# Patient Record
Sex: Female | Born: 1950 | Race: Black or African American | Hispanic: No | Smoking: Never smoker
Health system: Southern US, Community
[De-identification: ages and names within clinical notes are randomized; demographics above are authoritative.]

## PROBLEM LIST (undated history)

## (undated) DIAGNOSIS — E039 Hypothyroidism, unspecified: Secondary | ICD-10-CM

## (undated) DIAGNOSIS — M1990 Unspecified osteoarthritis, unspecified site: Secondary | ICD-10-CM

## (undated) DIAGNOSIS — C73 Malignant neoplasm of thyroid gland: Secondary | ICD-10-CM

## (undated) DIAGNOSIS — D649 Anemia, unspecified: Secondary | ICD-10-CM

## (undated) DIAGNOSIS — E785 Hyperlipidemia, unspecified: Secondary | ICD-10-CM

## (undated) DIAGNOSIS — I679 Cerebrovascular disease, unspecified: Secondary | ICD-10-CM

## (undated) DIAGNOSIS — J302 Other seasonal allergic rhinitis: Secondary | ICD-10-CM

## (undated) DIAGNOSIS — I480 Paroxysmal atrial fibrillation: Secondary | ICD-10-CM

## (undated) DIAGNOSIS — F329 Major depressive disorder, single episode, unspecified: Secondary | ICD-10-CM

## (undated) DIAGNOSIS — R931 Abnormal findings on diagnostic imaging of heart and coronary circulation: Secondary | ICD-10-CM

## (undated) DIAGNOSIS — Z950 Presence of cardiac pacemaker: Secondary | ICD-10-CM

## (undated) DIAGNOSIS — I6381 Other cerebral infarction due to occlusion or stenosis of small artery: Secondary | ICD-10-CM

## (undated) DIAGNOSIS — I639 Cerebral infarction, unspecified: Secondary | ICD-10-CM

## (undated) DIAGNOSIS — K219 Gastro-esophageal reflux disease without esophagitis: Secondary | ICD-10-CM

## (undated) DIAGNOSIS — F32A Depression, unspecified: Secondary | ICD-10-CM

## (undated) DIAGNOSIS — E109 Type 1 diabetes mellitus without complications: Secondary | ICD-10-CM

## (undated) DIAGNOSIS — I509 Heart failure, unspecified: Secondary | ICD-10-CM

## (undated) DIAGNOSIS — K649 Unspecified hemorrhoids: Secondary | ICD-10-CM

## (undated) DIAGNOSIS — I10 Essential (primary) hypertension: Secondary | ICD-10-CM

## (undated) HISTORY — DX: Cerebral infarction, unspecified: I63.9

## (undated) HISTORY — DX: Unspecified hemorrhoids: K64.9

## (undated) HISTORY — DX: Other seasonal allergic rhinitis: J30.2

## (undated) HISTORY — DX: Unspecified osteoarthritis, unspecified site: M19.90

## (undated) HISTORY — PX: CARDIAC CATHETERIZATION: SHX172

## (undated) HISTORY — DX: Gastro-esophageal reflux disease without esophagitis: K21.9

## (undated) HISTORY — DX: Hyperlipidemia, unspecified: E78.5

## (undated) HISTORY — DX: Heart failure, unspecified: I50.9

## (undated) HISTORY — DX: Hypothyroidism, unspecified: E03.9

## (undated) HISTORY — PX: ANKLE FRACTURE SURGERY: SHX122

## (undated) HISTORY — DX: Anemia, unspecified: D64.9

## (undated) HISTORY — DX: Essential (primary) hypertension: I10

## (undated) HISTORY — DX: Depression, unspecified: F32.A

## (undated) HISTORY — DX: Abnormal findings on diagnostic imaging of heart and coronary circulation: R93.1

## (undated) HISTORY — DX: Malignant neoplasm of thyroid gland: C73

## (undated) HISTORY — DX: Major depressive disorder, single episode, unspecified: F32.9

## (undated) HISTORY — DX: Type 1 diabetes mellitus without complications: E10.9

---

## 1993-12-25 HISTORY — PX: ABDOMINAL HYSTERECTOMY: SHX81

## 1998-04-23 ENCOUNTER — Inpatient Hospital Stay (HOSPITAL_COMMUNITY): Admission: EM | Admit: 1998-04-23 | Discharge: 1998-04-25 | Payer: Self-pay | Admitting: Emergency Medicine

## 1999-02-23 ENCOUNTER — Encounter: Payer: Self-pay | Admitting: Internal Medicine

## 1999-02-23 ENCOUNTER — Inpatient Hospital Stay (HOSPITAL_COMMUNITY): Admission: EM | Admit: 1999-02-23 | Discharge: 1999-02-25 | Payer: Self-pay | Admitting: Internal Medicine

## 1999-04-13 ENCOUNTER — Ambulatory Visit (HOSPITAL_COMMUNITY): Admission: RE | Admit: 1999-04-13 | Discharge: 1999-04-13 | Payer: Self-pay | Admitting: Internal Medicine

## 1999-04-13 ENCOUNTER — Encounter: Payer: Self-pay | Admitting: Internal Medicine

## 1999-04-14 ENCOUNTER — Ambulatory Visit (HOSPITAL_COMMUNITY): Admission: RE | Admit: 1999-04-14 | Discharge: 1999-04-14 | Payer: Self-pay | Admitting: Internal Medicine

## 1999-04-14 ENCOUNTER — Encounter: Payer: Self-pay | Admitting: Internal Medicine

## 2001-02-15 ENCOUNTER — Emergency Department (HOSPITAL_COMMUNITY): Admission: EM | Admit: 2001-02-15 | Discharge: 2001-02-15 | Payer: Self-pay | Admitting: Emergency Medicine

## 2001-07-15 ENCOUNTER — Ambulatory Visit (HOSPITAL_COMMUNITY): Admission: RE | Admit: 2001-07-15 | Discharge: 2001-07-15 | Payer: Self-pay | Admitting: Internal Medicine

## 2001-07-15 ENCOUNTER — Encounter: Payer: Self-pay | Admitting: Internal Medicine

## 2001-07-16 ENCOUNTER — Encounter: Payer: Self-pay | Admitting: Internal Medicine

## 2001-07-16 ENCOUNTER — Ambulatory Visit (HOSPITAL_COMMUNITY): Admission: RE | Admit: 2001-07-16 | Discharge: 2001-07-16 | Payer: Self-pay | Admitting: Internal Medicine

## 2002-01-13 ENCOUNTER — Encounter: Admission: RE | Admit: 2002-01-13 | Discharge: 2002-01-13 | Payer: Self-pay | Admitting: Family Medicine

## 2002-01-13 ENCOUNTER — Encounter: Payer: Self-pay | Admitting: Family Medicine

## 2002-01-28 ENCOUNTER — Ambulatory Visit (HOSPITAL_COMMUNITY): Admission: RE | Admit: 2002-01-28 | Discharge: 2002-01-28 | Payer: Self-pay | Admitting: Family Medicine

## 2002-03-14 ENCOUNTER — Encounter: Payer: Self-pay | Admitting: Family Medicine

## 2002-03-14 ENCOUNTER — Encounter: Admission: RE | Admit: 2002-03-14 | Discharge: 2002-03-14 | Payer: Self-pay | Admitting: Family Medicine

## 2002-09-26 ENCOUNTER — Encounter: Payer: Self-pay | Admitting: Family Medicine

## 2002-09-26 ENCOUNTER — Encounter: Admission: RE | Admit: 2002-09-26 | Discharge: 2002-09-26 | Payer: Self-pay | Admitting: Family Medicine

## 2004-01-13 ENCOUNTER — Ambulatory Visit (HOSPITAL_COMMUNITY): Admission: RE | Admit: 2004-01-13 | Discharge: 2004-01-13 | Payer: Self-pay | Admitting: Gastroenterology

## 2004-02-03 ENCOUNTER — Encounter: Admission: RE | Admit: 2004-02-03 | Discharge: 2004-02-03 | Payer: Self-pay | Admitting: Family Medicine

## 2004-12-25 HISTORY — PX: THYROIDECTOMY: SHX17

## 2005-02-02 ENCOUNTER — Emergency Department (HOSPITAL_COMMUNITY): Admission: EM | Admit: 2005-02-02 | Discharge: 2005-02-02 | Payer: Self-pay | Admitting: Family Medicine

## 2005-03-09 ENCOUNTER — Ambulatory Visit (HOSPITAL_COMMUNITY): Admission: RE | Admit: 2005-03-09 | Discharge: 2005-03-09 | Payer: Self-pay | Admitting: Endocrinology

## 2005-04-05 ENCOUNTER — Encounter: Admission: RE | Admit: 2005-04-05 | Discharge: 2005-04-05 | Payer: Self-pay | Admitting: Endocrinology

## 2005-04-05 ENCOUNTER — Other Ambulatory Visit: Admission: RE | Admit: 2005-04-05 | Discharge: 2005-04-05 | Payer: Self-pay | Admitting: Interventional Radiology

## 2005-04-05 ENCOUNTER — Encounter (INDEPENDENT_AMBULATORY_CARE_PROVIDER_SITE_OTHER): Payer: Self-pay | Admitting: Specialist

## 2005-07-06 ENCOUNTER — Observation Stay (HOSPITAL_COMMUNITY): Admission: RE | Admit: 2005-07-06 | Discharge: 2005-07-07 | Payer: Self-pay | Admitting: Surgery

## 2005-07-06 ENCOUNTER — Encounter (INDEPENDENT_AMBULATORY_CARE_PROVIDER_SITE_OTHER): Payer: Self-pay | Admitting: Specialist

## 2005-08-15 ENCOUNTER — Encounter (HOSPITAL_COMMUNITY): Admission: RE | Admit: 2005-08-15 | Discharge: 2005-11-13 | Payer: Self-pay | Admitting: Endocrinology

## 2006-01-18 ENCOUNTER — Emergency Department (HOSPITAL_COMMUNITY): Admission: EM | Admit: 2006-01-18 | Discharge: 2006-01-18 | Payer: Self-pay | Admitting: Family Medicine

## 2006-03-26 ENCOUNTER — Encounter (HOSPITAL_COMMUNITY): Admission: RE | Admit: 2006-03-26 | Discharge: 2006-06-24 | Payer: Self-pay | Admitting: Endocrinology

## 2006-05-12 ENCOUNTER — Emergency Department (HOSPITAL_COMMUNITY): Admission: EM | Admit: 2006-05-12 | Discharge: 2006-05-12 | Payer: Self-pay | Admitting: Family Medicine

## 2006-08-08 ENCOUNTER — Ambulatory Visit (HOSPITAL_COMMUNITY): Admission: RE | Admit: 2006-08-08 | Discharge: 2006-08-08 | Payer: Self-pay | Admitting: *Deleted

## 2006-08-08 ENCOUNTER — Encounter (INDEPENDENT_AMBULATORY_CARE_PROVIDER_SITE_OTHER): Payer: Self-pay | Admitting: *Deleted

## 2006-09-16 ENCOUNTER — Emergency Department (HOSPITAL_COMMUNITY): Admission: EM | Admit: 2006-09-16 | Discharge: 2006-09-16 | Payer: Self-pay | Admitting: Family Medicine

## 2007-09-20 ENCOUNTER — Emergency Department (HOSPITAL_COMMUNITY): Admission: EM | Admit: 2007-09-20 | Discharge: 2007-09-20 | Payer: Self-pay | Admitting: Family Medicine

## 2008-02-16 ENCOUNTER — Emergency Department (HOSPITAL_COMMUNITY): Admission: EM | Admit: 2008-02-16 | Discharge: 2008-02-16 | Payer: Self-pay | Admitting: Family Medicine

## 2008-04-06 ENCOUNTER — Observation Stay (HOSPITAL_COMMUNITY): Admission: EM | Admit: 2008-04-06 | Discharge: 2008-04-07 | Payer: Self-pay | Admitting: Emergency Medicine

## 2008-04-06 ENCOUNTER — Encounter (INDEPENDENT_AMBULATORY_CARE_PROVIDER_SITE_OTHER): Payer: Self-pay | Admitting: Internal Medicine

## 2008-10-25 ENCOUNTER — Emergency Department (HOSPITAL_COMMUNITY): Admission: EM | Admit: 2008-10-25 | Discharge: 2008-10-25 | Payer: Self-pay | Admitting: Emergency Medicine

## 2009-02-18 ENCOUNTER — Emergency Department (HOSPITAL_COMMUNITY): Admission: EM | Admit: 2009-02-18 | Discharge: 2009-02-18 | Payer: Self-pay | Admitting: Emergency Medicine

## 2011-01-15 ENCOUNTER — Encounter: Payer: Self-pay | Admitting: Endocrinology

## 2011-04-11 LAB — URINE CULTURE

## 2011-04-11 LAB — POCT URINALYSIS DIP (DEVICE)
Glucose, UA: NEGATIVE mg/dL
Ketones, ur: NEGATIVE mg/dL

## 2011-05-09 NOTE — Discharge Summary (Signed)
NAME:  [name], [name]             ACCOUNT NO.:  [id number]   MEDICAL RECORD NO.:  [id number]          PATIENT TYPE:  INP   LOCATION:  3729                         FACILITY:  [facility]   PHYSICIAN:  Hollice Espy, M.D.DATE OF BIRTH:  07/07/1951   DATE OF ADMISSION:  04/06/2008  DATE OF DISCHARGE:  04/07/2008                               DISCHARGE SUMMARY   PRIMARY CARE PHYSICIAN:  Dorisann Frames, MD   DISCHARGE DIAGNOSES:  1. Precordial chest pain, atypical, possible cardiac related, unable      to complete testing.  2. Possible coronary artery disease.  3. Hyperlipidemia.  4. Diabetes mellitus.  5. Hypertension.  6. Episode of anxiety.   NEW MEDICATIONS:  1. Metoprolol 25 mg orally b.i.d.  2. Lipitor 10 mg orally daily.  3. Aspirin 81 mg.  4. Omeprazole 20 mg.  5. Synthroid 150 mcg.  6. She will continue on her insulin pump.   DISCONTINUED MEDICATIONS:  The patient will stop her Hyzaar right now  secondary to starting on metoprolol and do not want a cause to have  hypotension.   HOSPITAL COURSE:  The patient is a 60 year old African American female  with past medical history of diabetes, hypertension, and hypothyroidism,  who is complaining of an episode of sharp chest pain in the early  morning hours of April 06, 2008.  She became concerned and came in to  the emergency room.  Her EKG and cardiac markers were unremarkable on  admission, and she was brought in for a rule out.  When she was  evaluated, the [facility] was consulted for her stress.  The  patient initially went for an adenosine Cardiolite, but during the test,  she became quite anxious, stopping the test secondary to claustrophobia.  Prior to this, she was attempted to have a risk stratification done by  cardiac CT; however, her anxiety prevented that.  After the initial  bolus, the patient's anxiety prevented further consummation of this test  as well.  A 2-D Echo was able to be completed, which  does have a normal  ejection fraction and no valvular disorder.  Given the fact that she was  unable to have a full cardiac CT or a stress test, [facility]  concluded that the patient as having atypical angina, possible CAD, and  based on her early CTA score, she was found to have 135 moderate number  compatible with CAD of the RCA, LM, RI with an ejection fraction normal  with some positive calcium found in the proximal and distal LAD.  However, again noting that this test was not able to be fully completed,  they recommended preventive measures to control blood pressure, diabetes  mellitus, and exercise, and possible cath down the line if the patient  would consent at some point.  After discussion with the patient, she  tells me that she will try to be quite aggressive with her health, so we  changed her medication from Hyzaar to metoprolol and start her on  Lipitor.  Her fasting lipid profile came back with an HDL of 46 and an  LDL of  98.  Given her early signs of possible CAD as well as diabetes  mellitus, her goal LDL will be below 70.  Again, the patient's  echocardiogram was stable.  The patient's disposition from initial  presentation is the same.   DISCHARGE DIET:  Heart-healthy, carb-modified diet.   She is being discharged to home, will follow up with primary care  physician in 2 weeks and with Dr. Verdis Prime of [facility] in 4  weeks.      Hollice Espy, M.D.  Electronically Signed     SKK/MEDQ  D:  04/07/2008  T:  04/08/2008  Job:  [id number]   cc:   Lyn Records, M.D.  Dorisann Frames, M.D.

## 2011-05-09 NOTE — H&P (Signed)
NAME:  AMEL, KITCH NO.:  [id number]   MEDICAL RECORD NO.:  [id number]          PATIENT TYPE:  EMS   LOCATION:  MAJO                         FACILITY:  [facility]   PHYSICIAN:  Sabino Donovan, MD        DATE OF BIRTH:  [birth year]   DATE OF ADMISSION:  04/06/2008  DATE OF DISCHARGE:                              HISTORY & PHYSICAL   PRIMARY CARE PHYSICIAN:  Dr. Dorisann Frames.   CHIEF COMPLAINT:  Chest pain.   HISTORY OF PRESENT ILLNESS:  [age]-year-old African American  female with history of hypertension and diabetes and hypothyroidism,  presented with a complaint of chest pain.  She reported that she woke up  this morning and went to the bathroom around 3:30 in the morning, when  she noted left-sided chest pain which was radiating into the jaw and  into the back.  She also reports left-sided arm tingling.  She reported  the whole incident lasted about 2 minutes; otherwise, she denied any  shortness of breath.  She tells me that she did feel yucky, but denies  any frank nausea.  No diaphoresis.  Further, she reports some dyspnea on  exertion, which is worse with climbing stairs.  She reports occasional  pedal edema, otherwise denies any orthopnea or PND. She does take  aspirin every day and does not smoke.  No significant family history of  coronary disease.  She reports that she has had hypercholesterolemia in  the past for which she was prescribed some medication which she took at  the time; I assume this was a statin and currently not been taking any  medication for her hyperlipidemia.   PAST MEDICAL HISTORY:  1. Hypertension.  2. Diabetes.  3. Hypothyroidism.  She has reportedly had thyroid cancer in the past      and is status post thyroidectomy and now on thyroid replacement.  4. Hyperlipidemia, has tried what is presumed to be statin without      success and gave her leg cramps.  5. Status post hysterectomy.   FAMILY HISTORY:  Mother with MI at the  age of 67s.   SOCIAL HISTORY:  Negative x3.   DRUG ALLERGIES:  No known drug allergies.   MEDICATIONS:  1. Synthroid 150 mcg p.o. daily.  2. Hyzaar, does not remember the dosing.  3. Omeprazole 20 mg p.o. daily.  4. Aspirin 325 mg p.o. daily.   REVIEW OF SYSTEMS:  Unremarkable.   PHYSICAL EXAMINATION:  VITAL SIGNS:  Temperature 97.4, pulse 70,  respiratory rate 20, blood pressure 161/79.  GENERAL:  In no acute distress.  HEENT:  PERRLA.  EOMI.  NECK:  No lymphadenopathy.  No thyromegaly.  No JVD.  CHEST:  Clear to auscultation bilaterally.  HEART:  Regular rate and rhythm.  No murmurs, rubs, or gallops.  ABDOMEN:  Soft, nontender and non-distended.  Normoactive bowel sounds.  EXTREMITIES:  No clubbing, cyanosis or edema.  Palpable pulses.  NEUROLOGIC:  Focally intact.   LABORATORY AND ACCESSORY CLINICAL DATA:  Sodium 138, potassium 3.1, BUN  12, creatinine 0.94.  White count 5, H&H 12.3  and 35.5, platelets  254,000.  Lipase less than 10, AST 19, ALT 16, total protein 6.6.  Troponin 0.05.   Chest x-ray was unremarkable.   EKG showed normal sinus rhythm, normal axis, no ST- or T-wave changes.   IMPRESSION AND PLAN:  [age]-year-old African American with  hypertension and diabetes presenting with chest pain.   1. Chest pain:  The patient presented with atypical chest pain;      however, given her risk factors including hypertension, diabetes      and obesity along with likely hyperlipidemia, we will rule out for      myocardial infarction.  The patient gives me some history of heart      failure.  We will check a transthoracic echocardiogram.  The      patient will need stress testing for further elucidation of      coronary disease.  Cardiology may be consulted; I will leave the      decision up to the oncoming physician whether this should be done      inpatient or outpatient.  We will check fasting lipid, TSH and      serial enzymes.  2. Hypertension, currently  well controlled:  I will consider starting      the patient on metoprolol 25 mg p.o. b.i.d.  She will get dosing of      Hyzaar, which can be started later on.  3. Diabetes:  The patient has an insulin pump.  We will check      hemoglobin A1c and continue sliding-scale insulin.  4. Hypothyroidism:  We will check TSH and T4.  5. Prophylaxis:  Lovenox and Protonix 40 mg p.o. daily.      Sabino Donovan, MD  Electronically Signed     MJ/MEDQ  D:  04/06/2008  T:  04/06/2008  Job:  [id number]

## 2011-05-12 NOTE — Op Note (Signed)
NAME:  [name], [name]                ACCOUNT NO.:  [id number]   MEDICAL RECORD NO.:  [id number]                   PATIENT TYPE:  AMB   LOCATION:  ENDO                                 FACILITY:  [facility]   PHYSICIAN:  Anselmo Rod, M.D.               DATE OF BIRTH:  [birth year]   DATE OF PROCEDURE:  01/13/2004  DATE OF DISCHARGE:                                 OPERATIVE REPORT   PROCEDURE PERFORMED:  Screening colonoscopy.   ENDOSCOPIST:  Anselmo Rod, M.D.   INSTRUMENT USED:  Olympus video colonoscope.   INDICATIONS FOR PROCEDURE:  60 year old African-American female with a  history of chronic constipation and rectal bleeding, undergoing a screening  colonoscopy to rule out colonic polyps, masses, etc.   PREPROCEDURE PREPARATION:  Informed consent was procured from the patient.  The patient was fasted for eight hours prior to the procedure and prepped  with a bottle of magnesium citrate and a gallon of GoLYTELY the night prior  to the procedure.   PREPROCEDURE PHYSICAL:  VITAL SIGNS:  The patient had stable vital signs.  NECK:  Supple.  CHEST:  Clear to auscultation.  S1 and S2 regular.  ABDOMEN:  Soft with normal bowel sounds.   DESCRIPTION OF THE PROCEDURE:  The patient was placed in a left lateral  decubitus position, sedated with 70 mg of Demerol and 7 mg of Versed  intravenously.  Once the patient was adequately sedated and maintained on  low-flow oxygen and continuous cardiac monitoring, the Olympus video  colonoscope was advanced from the rectum to the cecum.  The appendicular  orifice and the ileocecal valve were clearly visualized and photographed.  There was some residual stool in the colon.  Multiple washings were done.  Small internal hemorrhoids were seen on retroflexion.  No masses, polyps,  erosions, ulcerations, or diverticula were identified.  The patient  tolerated the procedure well without complications.   IMPRESSION:  1. Essentially  unrevealing colonoscopy up to the cecum except for small     internal hemorrhoids.  2. Some residual stool in the colon.  Multiple washings done.   RECOMMENDATIONS:  1. Continue a high-fiber diet with liberal fluid intake.  2. Repeat CRC screening in the next ten years unless the patient develops     any abnormal symptoms in the interim.  3. Outpatient followup as it arises in the future.                                               Anselmo Rod, M.D.    JNM/MEDQ  D:  01/13/2004  T:  01/13/2004  Job:  [id number]   cc:   Talmadge Coventry, M.D.  [address]  [place]  [place] [address]  Fax: [phone]

## 2011-05-12 NOTE — Op Note (Signed)
NAME:  [name], [name]             ACCOUNT NO.:  [id number]   MEDICAL RECORD NO.:  [id number]          PATIENT TYPE:  AMB   LOCATION:  DAY                          FACILITY:  [facility]   PHYSICIAN:  Velora Heckler, MD      DATE OF BIRTH:  Jan 29, 1951   DATE OF PROCEDURE:  07/06/2005  DATE OF DISCHARGE:                                 OPERATIVE REPORT   PREOPERATIVE DIAGNOSIS:  Multiple thyroid nodules with atypia.   POSTOPERATIVE DIAGNOSIS:  Multiple thyroid nodules with atypia.   PROCEDURE:  Total thyroidectomy.   SURGEON:  Velora Heckler, M.D.   ASSISTANT:  Leonie Man, M.D.   ANESTHESIA:  General.   ESTIMATED BLOOD LOSS:  Minimal.   PREPARATION:  Betadine.   COMPLICATIONS:  None.   INDICATIONS:  The patient is a 60 year old black female from [place],  [place] referred by Dr. Dorisann Frames with thyroid nodules and  atypia. The patient had known history of thyroid nodules. She had been  treated with thyroid hormone suppression. Fine-needle aspiration was  performed in April2006. 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 The patient now comes to surgery for resection.   DESCRIPTION OF PROCEDURE:  The procedure was done in OR #11 at the [facility] The patient is brought to the operating room,  placed in a supine position on the operating room table. Following the  administration of general anesthesia, the patient is prepped and draped in  the usual strict aseptic fashion. After ascertaining that an adequate level  of anesthesia been obtained, a Kocher incision was made a #10 blade,  dissection was carried down through subcutaneous tissues and platysma.  Hemostasis was obtained with the electrocautery. Skin flaps were developed  cephalad and caudad from the thyroid notch to the sternal notch. A Mahorner  self-retaining retractor was placed for  exposure. Strap muscles were incised  in the midline and dissection was begun on the left side of the neck. In the  midline, there are several small lymph nodes just above the thyroid isthmus.  These were resected and submitted to pathology labeled central compartment  lymph nodes. The left thyroid lobe was then gently dissected out. Strap  muscles were reflected laterally. The middle thyroid veins were divided  between small Ligaclips. The lobe is somewhat adherent to the overlying  strap muscles. Using a Barista, the lobe was exposed. Superior pole  vessels were ligated in continuity with 2-0 silk ties and medium Ligaclips  and divided. The gland is rolled anteriorly. Inferior venous tributaries are  divided between medium Ligaclips. The branches of the inferior thyroid  artery are divided between small Ligaclips. Recurrent laryngeal nerve was  identified and preserved. Parathyroid tissue was identified and preserved.  Gland is rolled further anteriorly and the ligament of Allyson Sabal was transected  with the electrocautery. Gland is rolled up and onto the anterior trachea.  It was freed from the anterior trachea with the electrocautery. Dry pack is  placed in the left neck.   We then turned our  attention to the right thyroid lobe. Right thyroid lobe  was much smaller. There are a few small nodular densities. Strap muscles  were reflected laterally. Middle thyroid vein divided between small  Ligaclips. Superior pole was again dissected out and vessels are ligated in  continuity with 2-0 silk ties and medium Ligaclips and divided. The inferior  venous tributaries were divided between medium Ligaclips. The gland is  rolled anteriorly. Branches of the inferior thyroid artery were divided  between small Ligaclips. Ligament of Allyson Sabal was transected leaving behind a  tiny amount of thyroid tissue adjacent to the recurrent laryngeal nerve.  Gland was then completely excised off the anterior  trachea. Sutures is used  to mark the left superior pole. Specimen is submitted to pathology labeled  total thyroid. Neck is irrigated on both sides with warm saline. Good  hemostasis was noted. Surgicel was placed over the area of the recurrent  laryngeal nerves bilaterally. Strap muscles were reapproximated in the  midline interrupted 3-0 Vicryl sutures. Platysma was closed with interrupted  3-0 Vicryl sutures. Skin was closed with running 4-0 Vicryl subcuticular  suture. Wound is washed and dried and Benzoin and Steri-Strips were applied.  Sterile dressings were applied. The patient is awakened from anesthesia and  brought to the recovery room in stable condition. The patient tolerated the  procedure well.       TMG/MEDQ  D:  07/06/2005  T:  07/06/2005  Job:  [id number]   cc:   Dorisann Frames, M.D.  [place] [address], [place] [address]  Fax: [phone]

## 2011-09-15 LAB — POCT URINALYSIS DIP (DEVICE)
Bilirubin Urine: NEGATIVE
Glucose, UA: NEGATIVE
Ketones, ur: NEGATIVE
Nitrite: NEGATIVE
Operator id: 235561
Specific Gravity, Urine: 1.015

## 2011-09-15 LAB — URINE CULTURE: Colony Count: 60000

## 2011-09-19 LAB — CBC
HCT: 36.5
Hemoglobin: 11.6 — ABNORMAL LOW
MCHC: 32.7
MCHC: 33.7
MCV: 86.9
Platelets: 254
RBC: 4.07
RBC: 4.23
WBC: 4.6
WBC: 5

## 2011-09-19 LAB — LIPID PANEL
HDL: 46
Triglycerides: 88
VLDL: 18

## 2011-09-19 LAB — HEMOGLOBIN A1C: Hgb A1c MFr Bld: 8.7 — ABNORMAL HIGH

## 2011-09-19 LAB — COMPREHENSIVE METABOLIC PANEL
AST: 19
Albumin: 3.2 — ABNORMAL LOW
BUN: 12
CO2: 25
CO2: 26
Calcium: 9.1
Calcium: 9.2
Creatinine, Ser: 0.95
GFR calc Af Amer: >60
GFR calc non Af Amer: >60
Glucose, Bld: 158 — ABNORMAL HIGH
Glucose, Bld: 240 — ABNORMAL HIGH
Sodium: 138
Total Bilirubin: 0.5
Total Protein: 6.6

## 2011-09-19 LAB — DIFFERENTIAL
Basophils Relative: 1
Eosinophils Absolute: 0.1
Eosinophils Relative: 2
Neutrophils Relative %: 43

## 2011-09-19 LAB — POCT CARDIAC MARKERS
CKMB, poc: 2.3
Troponin i, poc: <0.05

## 2011-09-19 LAB — CK TOTAL AND CKMB (NOT AT ARMC)
CK, MB: 2
Total CK: 136

## 2011-09-19 LAB — CARDIAC PANEL(CRET KIN+CKTOT+MB+TROPI)
Relative Index: 1.7
Troponin I: 0.02

## 2011-09-19 LAB — APTT: aPTT: 25

## 2011-09-19 LAB — TSH: TSH: 1.519

## 2011-09-19 LAB — LIPASE, BLOOD: Lipase: <10 — ABNORMAL LOW

## 2011-09-19 LAB — TROPONIN I: Troponin I: 0.02

## 2011-09-26 LAB — POCT URINALYSIS DIP (DEVICE)
Bilirubin Urine: NEGATIVE
Ketones, ur: NEGATIVE
Operator id: 235561
Protein, ur: 30 — AB
Specific Gravity, Urine: 1.01

## 2011-10-05 LAB — POCT URINALYSIS DIP (DEVICE)
Bilirubin Urine: NEGATIVE
Ketones, ur: NEGATIVE
Operator id: 126491
Protein, ur: NEGATIVE
Specific Gravity, Urine: 1.01

## 2011-10-05 LAB — URINE CULTURE: Colony Count: >=100000

## 2012-01-25 ENCOUNTER — Encounter: Payer: Self-pay | Admitting: Sports Medicine

## 2012-01-25 ENCOUNTER — Ambulatory Visit (INDEPENDENT_AMBULATORY_CARE_PROVIDER_SITE_OTHER): Payer: BC Managed Care – PPO | Admitting: Sports Medicine

## 2012-01-25 DIAGNOSIS — E890 Postprocedural hypothyroidism: Secondary | ICD-10-CM | POA: Insufficient documentation

## 2012-01-25 DIAGNOSIS — E1059 Type 1 diabetes mellitus with other circulatory complications: Secondary | ICD-10-CM | POA: Insufficient documentation

## 2012-01-25 DIAGNOSIS — E785 Hyperlipidemia, unspecified: Secondary | ICD-10-CM

## 2012-01-25 DIAGNOSIS — E109 Type 1 diabetes mellitus without complications: Secondary | ICD-10-CM

## 2012-01-25 DIAGNOSIS — Z23 Encounter for immunization: Secondary | ICD-10-CM

## 2012-01-25 DIAGNOSIS — K219 Gastro-esophageal reflux disease without esophagitis: Secondary | ICD-10-CM

## 2012-01-25 DIAGNOSIS — I10 Essential (primary) hypertension: Secondary | ICD-10-CM | POA: Insufficient documentation

## 2012-01-25 DIAGNOSIS — E039 Hypothyroidism, unspecified: Secondary | ICD-10-CM

## 2012-01-25 LAB — BASIC METABOLIC PANEL
BUN: 12 mg/dL (ref 6–23)
Calcium: 9.1 mg/dL (ref 8.4–10.5)
Creat: 1.07 mg/dL (ref 0.50–1.10)
Glucose, Bld: 212 mg/dL — ABNORMAL HIGH (ref 70–99)
Potassium: 4.4 mEq/L (ref 3.5–5.3)

## 2012-01-25 LAB — LDL CHOLESTEROL, DIRECT: Direct LDL: 75 mg/dL

## 2012-01-25 MED ORDER — INSULIN ASPART 100 UNIT/ML ~~LOC~~ SOLN: 0.0000 [IU] | Freq: Every day | SUBCUTANEOUS | Status: DC

## 2012-01-25 MED ORDER — INSULIN ASPART 100 UNIT/ML ~~LOC~~ SOLN: 21.0000 [IU] | Freq: Every day | SUBCUTANEOUS | Status: DC

## 2012-01-25 MED ORDER — ATORVASTATIN CALCIUM 10 MG PO TABS: 10.0000 mg | ORAL_TABLET | Freq: Every day | ORAL | Status: DC

## 2012-01-25 MED ORDER — LOSARTAN POTASSIUM-HCTZ 50-12.5 MG PO TABS: 1.0000 | ORAL_TABLET | Freq: Every day | ORAL | Status: DC

## 2012-01-25 MED ORDER — METOPROLOL SUCCINATE ER 50 MG PO TB24: 50.0000 mg | ORAL_TABLET | Freq: Every day | ORAL | Status: DC

## 2012-01-25 MED ORDER — LEVOTHYROXINE SODIUM 150 MCG PO TABS: 150.0000 ug | ORAL_TABLET | Freq: Every day | ORAL | Status: DC

## 2012-01-25 MED ORDER — ZOSTER VACCINE LIVE 19400 UNT/0.65ML ~~LOC~~ SOLR: 0.6500 mL | Freq: Once | SUBCUTANEOUS | Status: AC

## 2012-01-25 MED ORDER — INSULIN PUMP: 1.0000 | Freq: Every day | SUBCUTANEOUS | Status: DC

## 2012-01-25 MED ORDER — OMEPRAZOLE 40 MG PO CPDR: 40.0000 mg | DELAYED_RELEASE_CAPSULE | Freq: Every day | ORAL | Status: DC

## 2012-01-25 MED ORDER — ASPIRIN 81 MG PO TBEC: 81.0000 mg | DELAYED_RELEASE_TABLET | Freq: Every day | ORAL | Status: DC

## 2012-01-25 NOTE — Patient Instructions (Addendum)
It was nice meeting you today.   I am glad we were able to discuss your ongoing medical care and your medications.  It appears that you are very well managed but we are excited about joining your medical team.  We would be happy to refill any of your medications when you are due.  Have your pharmacy call us when you are due.    I have provided you a prescription for the Shingles Vaccine as well as a handout about the vaccine.  We are also going to refer you for a mammogram; you can expect a phone call from our office in the next 1-2 weeks.    I would like to get some labs today and will call you with any abnormal result; otherwise we will send you a letter with your results.   I would like to see you back in 3 months to check on how things are going and to discuss your finger issue and your questions about dietary supplements at that time.  If you need anything in the mean time or would like to return sooner to address these issues please call and schedule an appointment.  Additionally we do have an 24 hour emergency line if you are unsure if you need to be evaluated in the Emergency Department and need to talk to somebody.  I look forward to seeing you in the near future.

## 2012-01-25 NOTE — Progress Notes (Signed)
Subjective:  [name] is a 61 y.o. female presenting today for establishing care.  Please see Problem list for summary of care for her medical conditions.  She currently reports being well managed but is in need of a PCP for overall medical care and health maintenance.  Please see ROS for further history  ROS  Constitutional Denies fatigue or sleep disturbance  Infectious No fevers, no chills  Resp No cough, no congestion  Cardiac No reported MI in past although sees Dr. Jacinto Halim for her cardiac care.  ?Hx of CHF. No cardiac sx reported  GI +Reflux like symptoms; prior cough but improved with PPI  MSK Complains of L 3rd finger with flexure contracture   Trauma None reported currently prior R ankle Fx in 1990s  Activity Reports occasionally using cane to help her walk; does report some intermittent claudication  MEDS No issues with obtaining medications or compliance.      PAST FAMILY HISTORY: REVIEWED PAST MEDICAL HISTORY: REVIEWED PAST SURGICAL HISTORY: REVIEWED SOCIAL HISTORY: REVIEWED  PE: GENERAL:  Adult AA female examined in [facility].  In no acute distress. In no resp distress.   HNEENT: AT/[place], MMM, no scleral icterus, EOMi THORAX: HEART: RRR, S1/S2 heard, no murmur LUNGS: CTA B, no wheezes, no crackles ABDOMEN:  +BS, soft, non-tender, no rigidity, no guarding, no masses/organomegaly EXTREMITIES: Moves all 4 extremities spontaneously, warm well perfused, no edema, bilateral DP and PT pulses 1+/4.

## 2012-01-26 LAB — TSH: TSH: 0.241 u[IU]/mL — ABNORMAL LOW (ref 0.350–4.500)

## 2012-01-30 ENCOUNTER — Encounter: Payer: Self-pay | Admitting: Sports Medicine

## 2012-01-30 ENCOUNTER — Telehealth: Payer: Self-pay | Admitting: Sports Medicine

## 2012-01-30 NOTE — Assessment & Plan Note (Addendum)
Will check HbA1c - will forward labs to Dr. Talmage Nap.

## 2012-01-30 NOTE — Assessment & Plan Note (Signed)
Will check LDL today -- no changes at this time.

## 2012-01-30 NOTE — Assessment & Plan Note (Signed)
Currently well controlled. Recheck BP 118/66.  No changes in med regimen at this time.  Will check BMET

## 2012-01-30 NOTE — Assessment & Plan Note (Signed)
Will check TSH - no changes at this time

## 2012-01-30 NOTE — Telephone Encounter (Signed)
Pt recently fell and scraped her knees, is diabetic, feels sore, would like to know if she should be seen or wait to see how she feels later?

## 2012-01-30 NOTE — Assessment & Plan Note (Signed)
Will continue PPI at this time.  consider withdrawal trial in near future

## 2012-01-30 NOTE — Telephone Encounter (Signed)
Called and lvm for pt to return call.[name] Lynetta  

## 2012-01-31 ENCOUNTER — Other Ambulatory Visit: Payer: Self-pay | Admitting: Sports Medicine

## 2012-01-31 DIAGNOSIS — Z1231 Encounter for screening mammogram for malignant neoplasm of breast: Secondary | ICD-10-CM

## 2012-01-31 NOTE — Telephone Encounter (Signed)
Addended by: Gaspar Bidding D on: 01/31/2012 12:05 PM   Modules accepted: Orders

## 2012-01-31 NOTE — Telephone Encounter (Signed)
Please tell patient to make an appointment if she has any wounds that she is concerned about - especially if non-healing or any early signs of infection.      Will refill her meds for 30 day supply also.

## 2012-01-31 NOTE — Telephone Encounter (Signed)
lvm for pt to return call.Vickki Igou Lynetta  

## 2012-01-31 NOTE — Telephone Encounter (Signed)
Pt feel on knees and chest. She has been taking pain meds (tylenol). Pt is sore but still moving around pretty good . Offered to make an appt however pt is going to wait. I told her that if her pain became worse to call and make and appt to be seen. Pt agreed and understood. Also pt would like for Dr. Berline Chough to change her medication refills back to getting them filled once a month she cannot afford to do a 90 day prescription. Will forward to Dr. Berline Chough.Loralee Pacas [place]

## 2012-02-01 ENCOUNTER — Ambulatory Visit (HOSPITAL_COMMUNITY)
Admission: RE | Admit: 2012-02-01 | Discharge: 2012-02-01 | Disposition: A | Discharge status: Home / Self Care | Payer: BC Managed Care – PPO | Source: Ambulatory Visit | Attending: Family Medicine | Admitting: Family Medicine

## 2012-02-01 ENCOUNTER — Encounter: Payer: Self-pay | Admitting: Sports Medicine

## 2012-02-01 DIAGNOSIS — Z1231 Encounter for screening mammogram for malignant neoplasm of breast: Secondary | ICD-10-CM | POA: Insufficient documentation

## 2012-02-01 MED ORDER — ATORVASTATIN CALCIUM 10 MG PO TABS: 10.0000 mg | ORAL_TABLET | Freq: Every day | ORAL | Status: DC

## 2012-02-01 MED ORDER — METOPROLOL SUCCINATE ER 50 MG PO TB24: 50.0000 mg | ORAL_TABLET | Freq: Every day | ORAL | Status: DC

## 2012-02-01 MED ORDER — LEVOTHYROXINE SODIUM 150 MCG PO TABS: 150.0000 ug | ORAL_TABLET | Freq: Every day | ORAL | Status: DC

## 2012-02-01 MED ORDER — LOSARTAN POTASSIUM-HCTZ 50-12.5 MG PO TABS: 1.0000 | ORAL_TABLET | Freq: Every day | ORAL | Status: DC

## 2012-02-01 NOTE — Telephone Encounter (Signed)
Addended by: Gaspar Bidding D on: 02/01/2012 12:41 PM   Modules accepted: Orders

## 2012-02-01 NOTE — Telephone Encounter (Signed)
Please call and inform her that all rxs should be at her pharmacy for 30 day supply.  Will discuss with her options for other pharmacies at next visits for discounted 90 day supplies.    Have her follow up regarding fall prn.

## 2012-05-14 ENCOUNTER — Encounter (HOSPITAL_COMMUNITY)
Admission: RE | Disposition: A | Discharge status: Home / Self Care | Payer: Self-pay | Source: Ambulatory Visit | Attending: Cardiology

## 2012-05-14 ENCOUNTER — Ambulatory Visit (HOSPITAL_COMMUNITY)
Admission: RE | Admit: 2012-05-14 | Discharge: 2012-05-14 | Disposition: A | Discharge status: Home / Self Care | Payer: BC Managed Care – PPO | Source: Ambulatory Visit | Attending: Cardiology | Admitting: Cardiology

## 2012-05-14 DIAGNOSIS — E785 Hyperlipidemia, unspecified: Secondary | ICD-10-CM | POA: Insufficient documentation

## 2012-05-14 DIAGNOSIS — I739 Peripheral vascular disease, unspecified: Secondary | ICD-10-CM | POA: Insufficient documentation

## 2012-05-14 DIAGNOSIS — I2789 Other specified pulmonary heart diseases: Secondary | ICD-10-CM | POA: Insufficient documentation

## 2012-05-14 DIAGNOSIS — I10 Essential (primary) hypertension: Secondary | ICD-10-CM | POA: Insufficient documentation

## 2012-05-14 DIAGNOSIS — E119 Type 2 diabetes mellitus without complications: Secondary | ICD-10-CM | POA: Insufficient documentation

## 2012-05-14 DIAGNOSIS — R0989 Other specified symptoms and signs involving the circulatory and respiratory systems: Secondary | ICD-10-CM | POA: Insufficient documentation

## 2012-05-14 DIAGNOSIS — R0609 Other forms of dyspnea: Secondary | ICD-10-CM | POA: Insufficient documentation

## 2012-05-14 HISTORY — PX: LEFT AND RIGHT HEART CATHETERIZATION WITH CORONARY ANGIOGRAM: SHX5449

## 2012-05-14 HISTORY — PX: ABDOMINAL ANGIOGRAM: SHX5499

## 2012-05-14 LAB — POCT I-STAT 3, ART BLOOD GAS (G3+)
Acid-Base Excess: 1 mmol/L (ref 0.0–2.0)
Bicarbonate: 24.3 mEq/L — ABNORMAL HIGH (ref 20.0–24.0)
O2 Saturation: 95 %
TCO2: 25 mmol/L (ref 0–100)
pCO2 arterial: 34.8 mmHg — ABNORMAL LOW (ref 35.0–45.0)
pO2, Arterial: 72 mmHg — ABNORMAL LOW (ref 80.0–100.0)

## 2012-05-14 LAB — POCT I-STAT 3, VENOUS BLOOD GAS (G3P V)
O2 Saturation: 61 %
TCO2: 27 mmol/L (ref 0–100)

## 2012-05-14 LAB — GLUCOSE, CAPILLARY
Glucose-Capillary: 140 mg/dL — ABNORMAL HIGH (ref 70–99)
Glucose-Capillary: 204 mg/dL — ABNORMAL HIGH (ref 70–99)
Glucose-Capillary: 192 mg/dL — ABNORMAL HIGH (ref 70–99)

## 2012-05-14 SURGERY — LEFT AND RIGHT HEART CATHETERIZATION WITH CORONARY ANGIOGRAM
Anesthesia: LOCAL

## 2012-05-14 MED ORDER — ASPIRIN 81 MG PO CHEW
324.0000 mg | CHEWABLE_TABLET | ORAL | Status: AC
  Administered 2012-05-14: 324 mg via ORAL

## 2012-05-14 MED ORDER — LIDOCAINE HCL (PF) 1 % IJ SOLN
INTRAMUSCULAR | Status: AC
  Filled 2012-05-14: qty 30

## 2012-05-14 MED ORDER — ACETAMINOPHEN 325 MG PO TABS: 650.0000 mg | ORAL_TABLET | ORAL | Status: DC | PRN

## 2012-05-14 MED ORDER — SODIUM CHLORIDE 0.9 % IV SOLN
INTRAVENOUS | Status: DC
  Administered 2012-05-14: 06:00:00 via INTRAVENOUS

## 2012-05-14 MED ORDER — SODIUM CHLORIDE 0.9 % IJ SOLN: 3.0000 mL | Freq: Two times a day (BID) | INTRAMUSCULAR | Status: DC

## 2012-05-14 MED ORDER — ASPIRIN 81 MG PO CHEW
CHEWABLE_TABLET | ORAL | Status: AC
  Administered 2012-05-14: 324 mg via ORAL
  Filled 2012-05-14: qty 4

## 2012-05-14 MED ORDER — SODIUM CHLORIDE 0.9 % IV SOLN: 250.0000 mL | INTRAVENOUS | Status: DC | PRN

## 2012-05-14 MED ORDER — MIDAZOLAM HCL 2 MG/2ML IJ SOLN
INTRAMUSCULAR | Status: AC
  Filled 2012-05-14: qty 2

## 2012-05-14 MED ORDER — SODIUM CHLORIDE 0.9 % IJ SOLN: 3.0000 mL | INTRAMUSCULAR | Status: DC | PRN

## 2012-05-14 MED ORDER — NITROGLYCERIN 0.2 MG/ML ON CALL CATH LAB
INTRAVENOUS | Status: AC
  Filled 2012-05-14: qty 1

## 2012-05-14 MED ORDER — ONDANSETRON HCL 4 MG/2ML IJ SOLN: 4.0000 mg | Freq: Four times a day (QID) | INTRAMUSCULAR | Status: DC | PRN

## 2012-05-14 MED ORDER — CEFAZOLIN SODIUM 1-5 GM-% IV SOLN
INTRAVENOUS | Status: AC
  Filled 2012-05-14: qty 50

## 2012-05-14 MED ORDER — HYDROMORPHONE HCL PF 2 MG/ML IJ SOLN
INTRAMUSCULAR | Status: AC
  Filled 2012-05-14: qty 1

## 2012-05-14 MED ORDER — SODIUM CHLORIDE 0.9 % IV SOLN
1.0000 mL/kg/h | INTRAVENOUS | Status: DC
  Administered 2012-05-14: 1 mL/kg/h via INTRAVENOUS

## 2012-05-14 MED ORDER — HEPARIN (PORCINE) IN NACL 2-0.9 UNIT/ML-% IJ SOLN
INTRAMUSCULAR | Status: AC
  Filled 2012-05-14: qty 1000

## 2012-05-14 NOTE — Discharge Instructions (Signed)

## 2012-05-14 NOTE — Interval H&P Note (Signed)
History and Physical Interval Note:  05/14/2012 7:42 AM  [name]  has presented today for surgery, with the diagnosis of PVD  The various methods of treatment have been discussed with the patient and family. After consideration of risks, benefits and other options for treatment, the patient has consented to  Procedure(s) (LRB): LEFT AND RIGHT HEART CATHETERIZATION WITH CORONARY ANGIOGRAM (N/A) possible angioplasty ABDOMINAL ANGIOGRAM (N/A) and lower extremity angiogram and possible angioplasty as a surgical intervention .  The patients' history has been reviewed, patient examined, no change in status, stable for surgery.  I have reviewed the patients' chart and labs.  Questions were answered to the patient's satisfaction.     [name]

## 2012-05-14 NOTE — Progress Notes (Signed)
UP AND WALKED AND SMALL AMT OF OOZING NOTED RIGHT GROIN AND DRESSING CHANGED AND WALKED AGAIN AND TOLERATED WELL AND NO FURTHER OOZING NOTED

## 2012-05-14 NOTE — H&P (Signed)
  Please see paper chart  

## 2012-05-14 NOTE — CV Procedure (Signed)
Procedures performed: Femoral access Left heart catheterization including left ventriculography, selective right and left coronary arteriography. Right heart catheterization and calculation of cardiac output and cardiac index by Fick.. Abdominal aortogram. Abdominal aortogram and crossover from right into the left femoral artery placement of catheter tip in the left femoral artery and left femoral arteriogram with distal runoff Right femoral arteriogram with distal runoff Right femoral arteriogram and closure of the right femoral arterial access with Perclose.  Indication: Chest pain no, dyspnea yes DM yes, HTN yes. Claudication yes. :   HEMODYNAMIC DATA: The left ventricle pressure was 173/6 with  EDP14. Aortic pressure was  160/75 with a mean of 108 mmHg.  There was no pressure gradient across the aortic valve   Right coronary artery: Right coronary is a large caliber vessel and a  dominant vessel. It is tortuous. It is smooth and normal.   Left main coronary artery: Left main coronary artery is a large caliber  vessel, which is smooth and normal.   Circumflex: Circumflex is a moderate caliber vessel, giving origin to a  small obtuse marginal 1. Smooth and normal.   Ramus intermediate: NA.   LAD: LAD is a large caliber vessel, giving origin to several small  diagonals. It is smooth and normal.   Left ventriculogram: Left ventriculography revealed ejection fraction  of 55-60%. There was no wall motion abnormality.   Right heart catheterization:  RA Pressure 15/14 Mean 12.mm Hg. RA Saturation NA RV: 45/9 EDP 13 PA: 41/18 with mean of 28 mm Hg.PA saturation 61% PCW: 21/25 with mean of 20 mm Hg. Aortic saturation:  95% Cardiac Output: 5.51, Cardiac Index: 2.46 by FICK method.   Peripheral arthrogram: No evidence of abdominal aneurysm. 2 renal arteries one on either side and they're widely patent. The right renal artery has a inferior origin. Aortoiliac bifurcation was widely  patent.   femoral arteriogram: Normal femoral arteries and mild disease in the distal SFA constituting 20-30% stenoses. Extremely slow filling of the peripheral vessels. No significant peripheral arterial disease. Three-vessel runoff is noted on both legs.    IMPRESSIONS:  1. Normal coronary arteries, right dominant circulation. LVEF: 55% 2. Right heart cath revealing: Moderate pulmonary hypertension 3. No significant peripheral artery disease. S;ow filling in the peripheral vessels.   TECHNICAL PROCEDURE: Under sterile precautions using a 5-French right femoral access and a 7-French right femoral vein access, a balloon-tip Swan-Ganz catheter was advanced into the right atrium, right ventricle and then into the pulmonary capillary wedge position via right femoral venous access. Right-sided hemodynamics the carefully performed and the data was carefully analyzed. The catheter was then pulled out of the body.  A 5 [place] multipurpose B2 catheter was advanced via right femoral arterial access. The catheter was advanced into the left ventricle and left ventriculography was performed in the RAO projection.  The catheter was then pulled into the ascending aorta and selective right and left coronary arteriogram was performed. The cath was then pulled out of the body.  Abdominal aortogram: This was performed with the help of a 5 French Omni Flush catheter.  Femoral arterial runoff: Right and left femoral arterial runoff was performed using the same catheter after crossing over from the right femoral artery into the left femoral artery with the help of a Glidewire. The catheter tip was positioned into the left external iliac artery and left femoral arteriogram was performed followed by withdrawal of the same cath into the right femoral artery over a J-wire and repeating the  procedure on the right leg. The catheter was then pulled out of the body over J-wire.  Right femoral arteriogram was performed to  evaluate the arterial access and arterial access was closed with Perclose with excellent hemostasis. Hemostasis on the venous sheath was obtained by manual pressure. Patient tolerated the procedure well. There was no immediate complication. advanced into the ascending aorta over a safety J-wire. Left  ventriculography was performed in the RAO projection. The same catheter  was utilized to engage left main coronary artery.  A JR-4used to engage the  right coronary  artery and angiography was performed.  The catheter then  pulled out of body where J-wire. . Hemostasis  was obtained by applying perclose.  Right radial access was initially obtained, but because of inability to cross the radial loop, femoral access obtained.  No immediate complications.

## 2012-08-16 ENCOUNTER — Emergency Department (HOSPITAL_COMMUNITY)
Admission: EM | Admit: 2012-08-16 | Discharge: 2012-08-16 | Disposition: A | Discharge status: Home / Self Care | Payer: BC Managed Care – PPO | Attending: Emergency Medicine | Admitting: Emergency Medicine

## 2012-08-16 ENCOUNTER — Encounter (HOSPITAL_COMMUNITY): Payer: Self-pay | Admitting: Emergency Medicine

## 2012-08-16 ENCOUNTER — Emergency Department (HOSPITAL_COMMUNITY): Payer: BC Managed Care – PPO

## 2012-08-16 DIAGNOSIS — R079 Chest pain, unspecified: Secondary | ICD-10-CM | POA: Insufficient documentation

## 2012-08-16 DIAGNOSIS — Z7982 Long term (current) use of aspirin: Secondary | ICD-10-CM | POA: Insufficient documentation

## 2012-08-16 DIAGNOSIS — Z833 Family history of diabetes mellitus: Secondary | ICD-10-CM | POA: Insufficient documentation

## 2012-08-16 DIAGNOSIS — I10 Essential (primary) hypertension: Secondary | ICD-10-CM | POA: Insufficient documentation

## 2012-08-16 DIAGNOSIS — Z794 Long term (current) use of insulin: Secondary | ICD-10-CM | POA: Insufficient documentation

## 2012-08-16 DIAGNOSIS — Z8249 Family history of ischemic heart disease and other diseases of the circulatory system: Secondary | ICD-10-CM | POA: Insufficient documentation

## 2012-08-16 DIAGNOSIS — Z8489 Family history of other specified conditions: Secondary | ICD-10-CM | POA: Insufficient documentation

## 2012-08-16 DIAGNOSIS — R0781 Pleurodynia: Secondary | ICD-10-CM

## 2012-08-16 DIAGNOSIS — K219 Gastro-esophageal reflux disease without esophagitis: Secondary | ICD-10-CM | POA: Insufficient documentation

## 2012-08-16 DIAGNOSIS — E109 Type 1 diabetes mellitus without complications: Secondary | ICD-10-CM | POA: Insufficient documentation

## 2012-08-16 LAB — URINALYSIS, ROUTINE W REFLEX MICROSCOPIC
Nitrite: NEGATIVE
Protein, ur: NEGATIVE mg/dL
Specific Gravity, Urine: 1.018 (ref 1.005–1.030)
Urobilinogen, UA: 0.2 mg/dL (ref 0.0–1.0)

## 2012-08-16 MED ORDER — ACETAMINOPHEN 325 MG PO TABS
650.0000 mg | ORAL_TABLET | Freq: Once | ORAL | Status: AC
  Administered 2012-08-16: 650 mg via ORAL
  Filled 2012-08-16: qty 2

## 2012-08-16 MED ORDER — IBUPROFEN 400 MG PO TABS
400.0000 mg | ORAL_TABLET | Freq: Once | ORAL | Status: AC
  Administered 2012-08-16: 400 mg via ORAL
  Filled 2012-08-16: qty 1

## 2012-08-16 NOTE — ED Provider Notes (Signed)
History  This chart was scribed for Jones Skene, MD by Shari Heritage. The patient was seen in room TR04C/TR04C. Patient's care was started at 1256.     CSN: [id number]  Arrival date & time 08/16/12  1256   First MD Initiated Contact with Patient 08/16/12 1339      Chief Complaint  Patient presents with  . Rash   The history is provided by the patient. No language interpreter was used.   [name] is a 61 y.o. female who presents to the Emergency Department complaining of moderate to severe, right-sided rib pain with associated rash onset 1.5 weeks ago.  Patient describes the pain as achy, and says that it feels sharp when she bends over. Patient rates the pain as 8/10. Patient says that she she collided with her grandson 1 month ago. She also suffered a fall in May. She is not sure if those are the causes of her pain. Patient denies nausea, vomiting or abdominal pain. No diarrhea, constipation or blood in stool. Patient denies chest pain. Patient says that she have fevers or night sweats at least 1x per week, but they are baseline. Patient is ambulatory and has no discomfort or pain in her hips. Patient says that she has been taking 1 Ibuprofen as needed for the past week with mild relief. Patient has a history of hyperlipidemia, hypothyroidism, thyroid cancer, HTN, GERD and type 1 diabetes. Surgical history includes bypass surgery, thyroidectomy and abdominal hysterectomy. Patient says that her cardiologist has put her on medicine recently to treat constricting arteries.    Past Medical History  Diagnosis Date  . Thyroid cancer     per pt S/p Total Thyroidectomy with Radioactive Iodine Therapy  . Hyperlipidemia 01/25/2012  . Hypothyroidism 01/25/2012  . Hypertension 01/25/2012  . GERD (gastroesophageal reflux disease) 01/25/2012  . Seasonal allergies   . Type 1 diabetes mellitus     Past Surgical History  Procedure Date  . Thyroidectomy 2006  . Abdominal hysterectomy 1995     Family History  Problem Relation Age of Onset  . Heart disease Mother   . Hyperlipidemia Mother   . Hypertension Mother   . Early death Father     Head Injury at Work  . Hyperlipidemia Father   . Hypertension Father   . Diabetes Maternal Aunt     History  Substance Use Topics  . Smoking status: Never Smoker   . Smokeless tobacco: Not on file  . Alcohol Use: No    OB History    Grav Para Term Preterm Abortions TAB SAB Ect Mult Living                  Review of Systems  Constitutional: Negative for fever.  Cardiovascular: Negative for chest pain.  Gastrointestinal: Negative for nausea, vomiting, abdominal pain, diarrhea, constipation and blood in stool.    Allergies  Review of patient's allergies indicates no known allergies.  Home Medications   Current Outpatient Rx  Name Route Sig Dispense Refill  . ASPIRIN EC 81 MG PO TBEC Oral Take 81 mg by mouth daily.    . ATORVASTATIN CALCIUM 10 MG PO TABS Oral Take 10 mg by mouth daily.    Marland Kitchen CALCIUM PO Oral Take 1 tablet by mouth daily.    . INSULIN PUMP Subcutaneous Inject 1 each into the skin continuous. Uses novolog insulin in her pump.    Marland Kitchen LEVOTHYROXINE SODIUM 150 MCG PO TABS Oral Take 150 mcg by mouth daily.    Marland Kitchen  LOSARTAN POTASSIUM-HCTZ 50-12.5 MG PO TABS Oral Take 1 tablet by mouth daily.    Marland Kitchen METOPROLOL SUCCINATE ER 50 MG PO TB24 Oral Take 1 tablet (50 mg total) by mouth daily. Take with or immediately following a meal. 30 tablet 11  . OMEPRAZOLE 40 MG PO CPDR Oral Take 40 mg by mouth daily.    Marland Kitchen POTASSIUM PO Oral Take 2 tablets by mouth daily.      BP 139/93  Pulse 77  Temp 98.4 F (36.9 C) (Oral)  Resp 18  SpO2 96%  Physical Exam  Nursing note and vitals reviewed. Constitutional: She is oriented to person, place, and time. She appears well-developed and well-nourished. No distress.  HENT:  Head: Normocephalic and atraumatic.  Right Ear: External ear normal.  Left Ear: External ear normal.  Nose:  Nose normal.  Mouth/Throat: Oropharynx is clear and moist.  Eyes: Conjunctivae and EOM are normal. Pupils are equal, round, and reactive to light.  Neck: Normal range of motion. Neck supple. No tracheal deviation present.  Cardiovascular: Normal rate, regular rhythm and normal heart sounds.   Pulmonary/Chest: Effort normal and breath sounds normal. No respiratory distress. She has no wheezes. She has no rales.    Abdominal: Soft. Bowel sounds are normal. She exhibits no distension and no mass. There is no tenderness. There is no rebound and no guarding.       Obese  Musculoskeletal: Normal range of motion.  Neurological: She is alert and oriented to person, place, and time. No sensory deficit.  Skin: Skin is warm and dry.  Psychiatric: She has a normal mood and affect. Her behavior is normal.    ED Course  Procedures (including critical care time) DIAGNOSTIC STUDIES: Oxygen Saturation is 96% on room air, adequate by my interpretation.    COORDINATION OF CARE: 1:45pm- Patient informed of current plan for treatment and evaluation and agrees with plan at this time. Will administer 1 tablet of acetaminophen 650 mg and 1 tablet of Ibuprofen 400 mg. Will order an X-ray right ribs.   Results for orders placed during the hospital encounter of 08/16/12  URINALYSIS, ROUTINE W REFLEX MICROSCOPIC      Component Value Range   Color, Urine YELLOW  YELLOW   APPearance CLEAR  CLEAR   Specific Gravity, Urine 1.018  1.005 - 1.030   pH 5.5  5.0 - 8.0   Glucose, UA NEGATIVE  NEGATIVE mg/dL   Hgb urine dipstick NEGATIVE  NEGATIVE   Bilirubin Urine NEGATIVE  NEGATIVE   Ketones, ur NEGATIVE  NEGATIVE mg/dL   Protein, ur NEGATIVE  NEGATIVE mg/dL   Urobilinogen, UA 0.2  0.0 - 1.0 mg/dL   Nitrite NEGATIVE  NEGATIVE   Leukocytes, UA NEGATIVE  NEGATIVE    Dg Ribs Unilateral W/chest Right  08/16/2012  *RADIOLOGY REPORT*  Clinical Data: Right posterior chest pain with for 1 week after falling 1 month  ago.  RIGHT RIBS AND CHEST - 3+ VIEW  Comparison: Radiographs 04/06/2008.  Findings: The heart size and mediastinal contours are normal.  The lungs are clear and there is no pleural effusion or pneumothorax.  No right-sided rib fractures are identified.  A metallic BB was placed over the area pain inferolaterally.  There is a mild compression deformity of what appears to be the L2 vertebral body. This appears similar to prior chest radiographs.  IMPRESSION: No evidence of acute right-sided rib fracture, pleural effusion or pneumothorax.  L2 compression deformity appears unchanged.   Original Report Authenticated By:  Gerrianne Scale, M.D.      1. Rib pain       MDM  [name] is a 61 y.o. female presenting with some vague pain in the right flank consistent with right rib pain especially following some falls in a collision with her grandchild.  Her presentation is not consistent with pyelonephritis, urinary tract infection, or any severe intra-abdominal process, in addition is reproducible on physical exam and there is no evidence of shingles at this time- M.D. her history is not consistent with shingles either.  X-ray obtained shows no fracture of the ribs, there is an old lumbar compression fracture which she was informed of.  Urinalysis was negative.  Reassured patient and put her on a regimen of analgesics of Tylenol with some ibuprofen for breakthrough pain I told her to limit this due to her diabetes. Patient understands accepts medical condition and medical plan is as been dictated, questions have been answered she is discharged home stable and in good condition.   Jones Skene, MD 08/16/12 2134

## 2012-08-16 NOTE — ED Notes (Signed)
Pt c/o pain to right side with rash noted; no open sores noted

## 2012-08-16 NOTE — ED Notes (Signed)
Pt presents with 2 week h/o L mid-back pain.  Pt denies any injury, reports noting a "stinging" area below pain site that was blistered.  Neosporin applied at home with that area healing.  No blisters, redness noted.  Pt reports pain continues to mid-back, area is not tender to palpation, pt reports "it's deeper than that".

## 2012-08-26 ENCOUNTER — Emergency Department (HOSPITAL_COMMUNITY)
Admission: EM | Admit: 2012-08-26 | Discharge: 2012-08-26 | Disposition: A | Discharge status: Home / Self Care | Payer: BC Managed Care – PPO | Attending: Emergency Medicine | Admitting: Emergency Medicine

## 2012-08-26 ENCOUNTER — Encounter (HOSPITAL_COMMUNITY): Payer: Self-pay | Admitting: Emergency Medicine

## 2012-08-26 ENCOUNTER — Emergency Department (HOSPITAL_COMMUNITY): Payer: BC Managed Care – PPO

## 2012-08-26 DIAGNOSIS — E785 Hyperlipidemia, unspecified: Secondary | ICD-10-CM | POA: Insufficient documentation

## 2012-08-26 DIAGNOSIS — Z79899 Other long term (current) drug therapy: Secondary | ICD-10-CM | POA: Insufficient documentation

## 2012-08-26 DIAGNOSIS — Z9641 Presence of insulin pump (external) (internal): Secondary | ICD-10-CM | POA: Insufficient documentation

## 2012-08-26 DIAGNOSIS — I10 Essential (primary) hypertension: Secondary | ICD-10-CM

## 2012-08-26 DIAGNOSIS — E109 Type 1 diabetes mellitus without complications: Secondary | ICD-10-CM | POA: Insufficient documentation

## 2012-08-26 DIAGNOSIS — E039 Hypothyroidism, unspecified: Secondary | ICD-10-CM | POA: Insufficient documentation

## 2012-08-26 DIAGNOSIS — K219 Gastro-esophageal reflux disease without esophagitis: Secondary | ICD-10-CM | POA: Insufficient documentation

## 2012-08-26 DIAGNOSIS — N390 Urinary tract infection, site not specified: Secondary | ICD-10-CM

## 2012-08-26 LAB — CBC WITH DIFFERENTIAL/PLATELET
Basophils Relative: 1 % (ref 0–1)
Eosinophils Absolute: 0.1 10*3/uL (ref 0.0–0.7)
Eosinophils Relative: 2 % (ref 0–5)
Lymphs Abs: 1.9 10*3/uL (ref 0.7–4.0)
MCH: 27.4 pg (ref 26.0–34.0)
MCHC: 32.4 g/dL (ref 30.0–36.0)
MCV: 84.7 fL (ref 78.0–100.0)
Monocytes Relative: 9 % (ref 3–12)
Neutrophils Relative %: 48 % (ref 43–77)
Platelets: 234 10*3/uL (ref 150–400)

## 2012-08-26 LAB — URINALYSIS, ROUTINE W REFLEX MICROSCOPIC
Glucose, UA: NEGATIVE mg/dL
Nitrite: NEGATIVE
Specific Gravity, Urine: 1.004 — ABNORMAL LOW (ref 1.005–1.030)
pH: 6.5 (ref 5.0–8.0)

## 2012-08-26 LAB — COMPREHENSIVE METABOLIC PANEL
Albumin: 3.4 g/dL — ABNORMAL LOW (ref 3.5–5.2)
Alkaline Phosphatase: 82 U/L (ref 39–117)
BUN: 16 mg/dL (ref 6–23)
Calcium: 9.4 mg/dL (ref 8.4–10.5)
GFR calc Af Amer: 69 mL/min — ABNORMAL LOW (ref >90)
Glucose, Bld: 128 mg/dL — ABNORMAL HIGH (ref 70–99)
Potassium: 3.6 mEq/L (ref 3.5–5.1)
Sodium: 138 mEq/L (ref 135–145)
Total Protein: 7.2 g/dL (ref 6.0–8.3)

## 2012-08-26 LAB — URINE MICROSCOPIC-ADD ON

## 2012-08-26 LAB — GLUCOSE, CAPILLARY: Glucose-Capillary: 138 mg/dL — ABNORMAL HIGH (ref 70–99)

## 2012-08-26 MED ORDER — SULFAMETHOXAZOLE-TRIMETHOPRIM 800-160 MG PO TABS: 1.0000 | ORAL_TABLET | Freq: Two times a day (BID) | ORAL | Status: AC

## 2012-08-26 MED ORDER — SULFAMETHOXAZOLE-TMP DS 800-160 MG PO TABS
1.0000 | ORAL_TABLET | Freq: Once | ORAL | Status: AC
  Administered 2012-08-26: 1 via ORAL
  Filled 2012-08-26: qty 1

## 2012-08-26 MED ORDER — FLUCONAZOLE 200 MG PO TABS: 200.0000 mg | ORAL_TABLET | Freq: Every day | ORAL | Status: AC

## 2012-08-26 NOTE — ED Notes (Signed)
CBG was 138. Notified Nurse Clydie Braun.

## 2012-08-26 NOTE — ED Notes (Addendum)
Pt. Changed pump 2 days ago and received error message. Called company who took her through series of questions to fix pump. Tonight she turned her pump off. Pt never became dizzy or weak states she just felt scared.

## 2012-08-26 NOTE — ED Notes (Signed)
Pt reports blood sugar has been low tonight.  States it is normally in the 200s and she is concerned that it will drop too low tonight because it was 100 the last time she checked it.  States she removed her insulin pump around 11pm.  Denies any other symptoms.

## 2012-08-26 NOTE — ED Provider Notes (Signed)
History     CSN: [id number]  Arrival date & time 08/26/12  0037   First MD Initiated Contact with Patient 08/26/12 [phone]      Chief Complaint  Patient presents with  . Hypoglycemia    (Consider location/radiation/quality/duration/timing/severity/associated sxs/prior treatment) HPI The patient presents with concerns of hypoglycemia.  She is an insulin-dependent diabetic, has an insulin pump.  She notes over the past day her sugar has been decreasing, beyond her typical range in the 200s.  Just prior to arrival the patient's sugar was 130.  She denies focal pain, nausea, vomiting and headache.  The patient has an insulin pump that is due for replacement in the near future. Past Medical History  Diagnosis Date  . Thyroid cancer     per pt S/p Total Thyroidectomy with Radioactive Iodine Therapy  . Hyperlipidemia 01/25/2012  . Hypothyroidism 01/25/2012  . Hypertension 01/25/2012  . GERD (gastroesophageal reflux disease) 01/25/2012  . Seasonal allergies   . Type 1 diabetes mellitus     Past Surgical History  Procedure Date  . Thyroidectomy 2006  . Abdominal hysterectomy 1995    Family History  Problem Relation Age of Onset  . Heart disease Mother   . Hyperlipidemia Mother   . Hypertension Mother   . Early death Father     Head Injury at Work  . Hyperlipidemia Father   . Hypertension Father   . Diabetes Maternal Aunt     History  Substance Use Topics  . Smoking status: Never Smoker   . Smokeless tobacco: Not on file  . Alcohol Use: No    OB History    Grav Para Term Preterm Abortions TAB SAB Ect Mult Living                  Review of Systems  Constitutional:       HPI  HENT:       HPI otherwise negative  Eyes: Negative.   Respiratory:       HPI, otherwise negative  Cardiovascular:       HPI, otherwise nmegative  Gastrointestinal: Negative for vomiting.  Genitourinary:       HPI, otherwise negative  Musculoskeletal:       HPI, otherwise negative  Skin:  Negative.   Neurological: Negative for syncope.    Allergies  Review of patient's allergies indicates no known allergies.  Home Medications   Current Outpatient Rx  Name Route Sig Dispense Refill  . ACETAMINOPHEN 500 MG PO TABS Oral Take 1,000 mg by mouth every 6 (six) hours as needed. For pain    . ASPIRIN EC 81 MG PO TBEC Oral Take 81 mg by mouth daily.    . ATORVASTATIN CALCIUM 10 MG PO TABS Oral Take 10 mg by mouth daily.    Marland Kitchen CALCIUM PO Oral Take 1 tablet by mouth daily. OTC    . IBUPROFEN 200 MG PO TABS Oral Take 400 mg by mouth every 6 (six) hours as needed. For pain    . INSULIN PUMP Subcutaneous Inject 1 each into the skin continuous. Uses novolog insulin in her pump.    Marland Kitchen LEVOTHYROXINE SODIUM 150 MCG PO TABS Oral Take 150 mcg by mouth daily.    Marland Kitchen LOSARTAN POTASSIUM-HCTZ 50-12.5 MG PO TABS Oral Take 1 tablet by mouth daily.    Marland Kitchen METOPROLOL SUCCINATE ER 50 MG PO TB24 Oral Take 1 tablet (50 mg total) by mouth daily. Take with or immediately following a meal. 30 tablet 11  .  OMEPRAZOLE 40 MG PO CPDR Oral Take 40 mg by mouth daily.    Marland Kitchen POTASSIUM PO Oral Take 2 tablets by mouth daily. OTC    . SULFAMETHOXAZOLE-TRIMETHOPRIM 800-160 MG PO TABS Oral Take 1 tablet by mouth every 12 (twelve) hours. 10 tablet 0    BP 191/70  Pulse 73  Temp 98.3 F (36.8 C) (Oral)  Resp 16  SpO2 98%  Physical Exam  Nursing note and vitals reviewed. Constitutional: She is oriented to person, place, and time. She appears well-developed and well-nourished. No distress.  HENT:  Head: Normocephalic and atraumatic.  Eyes: Conjunctivae and EOM are normal.  Cardiovascular: Normal rate and regular rhythm.   Pulmonary/Chest: Effort normal and breath sounds normal. No stridor. No respiratory distress.  Abdominal: She exhibits no distension.       Insulin to port palpable in the right lower quadrant, clean, dry, intact.  Not attached to pump  Musculoskeletal: She exhibits no edema.  Neurological: She is  alert and oriented to person, place, and time. No cranial nerve deficit.  Skin: Skin is warm and dry.  Psychiatric: She has a normal mood and affect.    ED Course  Procedures (including critical care time)  Labs Reviewed  GLUCOSE, CAPILLARY - Abnormal; Notable for the following:    Glucose-Capillary 138 (*)     All other components within normal limits  CBC WITH DIFFERENTIAL - Abnormal; Notable for the following:    Hemoglobin 11.5 (*)     HCT 35.5 (*)     All other components within normal limits  COMPREHENSIVE METABOLIC PANEL - Abnormal; Notable for the following:    Glucose, Bld 128 (*)     Albumin 3.4 (*)     Total Bilirubin 0.2 (*)     GFR calc non Af Amer 60 (*)     GFR calc Af Amer 69 (*)     All other components within normal limits  URINALYSIS, ROUTINE W REFLEX MICROSCOPIC - Abnormal; Notable for the following:    Color, Urine STRAW (*)     Specific Gravity, Urine 1.004 (*)     Leukocytes, UA TRACE (*)     All other components within normal limits  URINE MICROSCOPIC-ADD ON  URINE CULTURE   Dg Chest 2 View  08/26/2012  *RADIOLOGY REPORT*  Clinical Data: Headache, discomfort, history diabetes, hypertension, thyroid cancer  CHEST - 2 VIEW  Comparison: 08/16/2012  Findings: Enlargement of cardiac silhouette with pulmonary vascular congestion. Atherosclerotic calcification aortic arch. Lungs clear. No pleural effusion or pneumothorax. Surgical clips at the cervical region bilaterally consistent with thyroidectomy. No acute osseous findings. Mild superior endplate compression deformity of a mid to lower thoracic vertebra, age indeterminate. Bones appear demineralized.  IMPRESSION: Enlargement of cardiac silhouette with pulmonary vascular congestion. Age indeterminate compression deformity of a mid to lower thoracic vertebra. No acute pulmonary abnormalities.   Original Report Authenticated By: Lollie Marrow, M.D.      1. UTI (lower urinary tract infection)   2. Hypertension        MDM   this female with incidental now presents with concerns of increasing hypoglycemia.  Notably the patient is a focal complaints, and given her history of insulin there is a suspicion of occult infection driving her condition.  Patient's labs are largely reassuring, though was informed that there was some suggestion of a urinary tract infection on her urinalysis, she stated that she frequently has this affliction, and requests antibiotics.  Though the patient's presentation may  be due to faulty insulin pump, her diabetes, likely neuropathy, recurrent urinary tract infections indicates the initiation of treatment.  She is counseled on the need for close followup, close monitoring of her blood sugar.  She was discharged in stable condition to follow up with her primary care      Gerhard Munch, MD 08/26/12 [phone]

## 2012-08-27 LAB — URINE CULTURE: Colony Count: NO GROWTH

## 2012-12-11 ENCOUNTER — Emergency Department (HOSPITAL_COMMUNITY)
Admission: EM | Admit: 2012-12-11 | Discharge: 2012-12-11 | Discharge status: Against Medical Advice | Payer: BC Managed Care – PPO | Attending: Emergency Medicine | Admitting: Emergency Medicine

## 2012-12-11 ENCOUNTER — Telehealth: Payer: Self-pay | Admitting: Sports Medicine

## 2012-12-11 ENCOUNTER — Encounter (HOSPITAL_COMMUNITY): Payer: Self-pay | Admitting: *Deleted

## 2012-12-11 DIAGNOSIS — E109 Type 1 diabetes mellitus without complications: Secondary | ICD-10-CM | POA: Insufficient documentation

## 2012-12-11 DIAGNOSIS — I10 Essential (primary) hypertension: Secondary | ICD-10-CM | POA: Insufficient documentation

## 2012-12-11 DIAGNOSIS — Z8719 Personal history of other diseases of the digestive system: Secondary | ICD-10-CM | POA: Insufficient documentation

## 2012-12-11 DIAGNOSIS — Z923 Personal history of irradiation: Secondary | ICD-10-CM | POA: Insufficient documentation

## 2012-12-11 DIAGNOSIS — M25519 Pain in unspecified shoulder: Secondary | ICD-10-CM | POA: Insufficient documentation

## 2012-12-11 DIAGNOSIS — Z79899 Other long term (current) drug therapy: Secondary | ICD-10-CM | POA: Insufficient documentation

## 2012-12-11 DIAGNOSIS — E039 Hypothyroidism, unspecified: Secondary | ICD-10-CM | POA: Insufficient documentation

## 2012-12-11 DIAGNOSIS — E785 Hyperlipidemia, unspecified: Secondary | ICD-10-CM | POA: Insufficient documentation

## 2012-12-11 DIAGNOSIS — Z85850 Personal history of malignant neoplasm of thyroid: Secondary | ICD-10-CM | POA: Insufficient documentation

## 2012-12-11 NOTE — ED Notes (Signed)
Pt reports sitting watching TV when her (L) arm started hurting up into her neck.  Reports chest tightness.  Pt very vague-multiple complaints.  Pt denies SOB/N/V/diaphoresis.

## 2012-12-11 NOTE — ED Notes (Signed)
Unable to locate pt since 3pm

## 2012-12-11 NOTE — ED Notes (Signed)
Unable to locate pt in waiting room.

## 2012-12-12 ENCOUNTER — Encounter: Payer: Self-pay | Admitting: Family Medicine

## 2012-12-12 ENCOUNTER — Ambulatory Visit (INDEPENDENT_AMBULATORY_CARE_PROVIDER_SITE_OTHER): Payer: BC Managed Care – PPO | Admitting: Family Medicine

## 2012-12-12 VITALS — BP 138/80 | HR 66 | Temp 98.1°F | Ht 67.0 in | Wt 266.0 lb

## 2012-12-12 DIAGNOSIS — M79609 Pain in unspecified limb: Secondary | ICD-10-CM

## 2012-12-12 DIAGNOSIS — E109 Type 1 diabetes mellitus without complications: Secondary | ICD-10-CM

## 2012-12-12 DIAGNOSIS — M79602 Pain in left arm: Secondary | ICD-10-CM | POA: Insufficient documentation

## 2012-12-12 NOTE — Assessment & Plan Note (Signed)
Left arm pain, resolved.  Atypical for cardiac, normal EKG while active yesterday in ER.  Advised ok to use intermittent icy hot prn.  Advised needs to see primary doctor to address chronic dyspnea and need to renew handicapped sticker

## 2012-12-12 NOTE — Patient Instructions (Addendum)
Ok to use icy hot if you arms starts to hurt again  Your EKG in the ER was normal  Follow-up with yoru primary doctor to talk about other concerns

## 2012-12-12 NOTE — Progress Notes (Signed)
  Subjective:    Patient ID: [name], female    DOB: [birth year], 61 y.o.   MRN: [id number]  HPI Here to for work in appt to evaluate arm pain  Went to ER yesterday, had normal EKG, left without being seen.  Left arm pain: Started yesterday while watching TV.  Points to her upper arm, shoulder, neck.  Has to sleep on left side for "a long time" due to right arm tingling when when sleeps on it.  Has tried ibuprofen and icy hot and then went to sleep.  Points to upper arm as area of pain.  It has not hurt this morning yet.  No history of injury of overuse.  Patient requests handicapped sticker for dyspnea.  She states her endocrinologist has been writing this for her but has requested that her primary doctor do this. Review of Systemssee HPI     Objective:   Physical Exam GEN: Alert & Oriented, No acute distress CV:  Regular Rate & Rhythm, no murmur Respiratory:  Normal work of breathing, CTAB MSK: good shoulder strength and ROM.  No pain on palpation of shoulder or arm or neck.        Assessment & Plan:

## 2012-12-26 ENCOUNTER — Emergency Department
Admission: EM | Admit: 2012-12-26 | Discharge: 2012-12-26 | Disposition: A | Discharge status: Home / Self Care | Payer: BC Managed Care – PPO | Source: Home / Self Care | Attending: Family Medicine | Admitting: Family Medicine

## 2012-12-26 ENCOUNTER — Encounter: Payer: Self-pay | Admitting: *Deleted

## 2012-12-26 DIAGNOSIS — R300 Dysuria: Secondary | ICD-10-CM

## 2012-12-26 LAB — POCT URINALYSIS DIP (MANUAL ENTRY)
Bilirubin, UA: NEGATIVE
Ketones, POC UA: NEGATIVE
Spec Grav, UA: 1.02 (ref 1.005–1.03)
pH, UA: 6 (ref 5–8)

## 2012-12-26 MED ORDER — CEPHALEXIN 500 MG PO CAPS: 500.0000 mg | ORAL_CAPSULE | Freq: Three times a day (TID) | ORAL | Status: AC

## 2012-12-26 NOTE — ED Provider Notes (Signed)
History     CSN: [id number]  Arrival date & time 12/26/12  4403   First MD Initiated Contact with Patient 12/26/12 [phone]      Chief Complaint  Patient presents with  . Dysuria   HPI  DYSURIA Onset:  2 days  Description: dysuria, increased urinary frequency. No back pain, nausea, vomiting, fever  Modifying factors: type 1 diabetic. On insulin pump. Most recent A1C @8 .6. Has been seen by urology for this in the past with normal anatomy/bladder per pt. Last UTI was 2 years ago.   Symptoms Urgency:  yes Frequency: yes  Hesitancy:  yes Hematuria:  no Flank Pain:  no Fever: no Nausea/Vomiting:  no Missed LMP: no STD exposure: no Discharge: no Irritants: nio Rash: no  Red Flags   More than 3 UTI's last 12 months:  no PMH of  Diabetes or Immunosuppression:  yes Renal Disease/Calculi: no Urinary Tract Abnormality:  no Instrumentation or Trauma: no    Past Medical History  Diagnosis Date  . Thyroid cancer     per pt S/p Total Thyroidectomy with Radioactive Iodine Therapy  . Hyperlipidemia 01/25/2012  . Hypothyroidism 01/25/2012  . Hypertension 01/25/2012  . GERD (gastroesophageal reflux disease) 01/25/2012  . Seasonal allergies   . Type 1 diabetes mellitus     Past Surgical History  Procedure Date  . Thyroidectomy 2006  . Abdominal hysterectomy 1995    Family History  Problem Relation Age of Onset  . Heart disease Mother   . Hyperlipidemia Mother   . Hypertension Mother   . Early death Father     Head Injury at Work  . Hyperlipidemia Father   . Hypertension Father   . Diabetes Maternal Aunt     History  Substance Use Topics  . Smoking status: Never Smoker   . Smokeless tobacco: Not on file  . Alcohol Use: No    OB History    Grav Para Term Preterm Abortions TAB SAB Ect Mult Living                  Review of Systems  All other systems reviewed and are negative.    Allergies  Review of patient's allergies indicates no known allergies.  Home  Medications   Current Outpatient Rx  Name  Route  Sig  Dispense  Refill  . ACETAMINOPHEN 500 MG PO TABS   Oral   Take 1,000 mg by mouth every 6 (six) hours as needed. For pain         . ASPIRIN EC 81 MG PO TBEC   Oral   Take 81 mg by mouth daily.         . ATORVASTATIN CALCIUM 10 MG PO TABS   Oral   Take 10 mg by mouth daily.         Marland Kitchen CALCIUM PO   Oral   Take 1 tablet by mouth daily. OTC         . CEPHALEXIN 500 MG PO CAPS   Oral   Take 1 capsule (500 mg total) by mouth 3 (three) times daily.   30 capsule   0   . IBUPROFEN 200 MG PO TABS   Oral   Take 400 mg by mouth every 6 (six) hours as needed. For pain         . INSULIN PUMP   Subcutaneous   Inject 1 each into the skin continuous. Uses novolog insulin in her pump.         Marland Kitchen  LEVOTHYROXINE SODIUM 150 MCG PO TABS   Oral   Take 150 mcg by mouth daily.         Marland Kitchen LOSARTAN POTASSIUM-HCTZ 50-12.5 MG PO TABS   Oral   Take 1 tablet by mouth daily.         Marland Kitchen METOPROLOL SUCCINATE ER 50 MG PO TB24   Oral   Take 1 tablet (50 mg total) by mouth daily. Take with or immediately following a meal.   30 tablet   11   . OMEPRAZOLE 40 MG PO CPDR   Oral   Take 40 mg by mouth daily.         Marland Kitchen POTASSIUM PO   Oral   Take 2 tablets by mouth daily. OTC           BP 132/74  Pulse 86  Temp 98.2 F (36.8 C) (Oral)  Resp 18  Ht 5\' 7"  (1.702 m)  Wt 261 lb (118.389 kg)  BMI 40.88 kg/m2  SpO2 97%  Physical Exam  Constitutional:       Obese   HENT:  Head: Normocephalic and atraumatic.  Right Ear: External ear normal.  Left Ear: External ear normal.  Eyes: Conjunctivae normal are normal. Pupils are equal, round, and reactive to light.  Neck: Normal range of motion.  Cardiovascular: Normal rate, regular rhythm and normal heart sounds.   Pulmonary/Chest: Effort normal and breath sounds normal.  Abdominal: Soft. Bowel sounds are normal.       No flank pain  + suprapubic tenderness Insulin pump  insertion site CDI  Musculoskeletal: Normal range of motion.  Neurological: She is alert.  Skin: Skin is warm.    ED Course  Procedures (including critical care time)   Labs Reviewed  POCT URINALYSIS DIP (MANUAL ENTRY)  URINE CULTURE   No results found.   1. Dysuria       MDM  Will treat with extended course of keflex.  Urine culture.  Discussed infectious red flags at length with pt.  Otherwise follow up as needed.  Consider referral back to urology if this becomes a recurrent issue.      The patient and/or caregiver has been counseled thoroughly with regard to treatment plan and/or medications prescribed including dosage, schedule, interactions, rationale for use, and possible side effects and they verbalize understanding. Diagnoses and expected course of recovery discussed and will return if not improved as expected or if the condition worsens. Patient and/or caregiver verbalized understanding.             Doree Albee, MD 12/26/12 [phone]

## 2012-12-26 NOTE — ED Notes (Signed)
Dysuria and polyuria x 4 days. No otc meds taken. Patient has a hx for frequent utis, cleared by urologist, no uti in 2 years.

## 2012-12-31 ENCOUNTER — Telehealth: Payer: Self-pay | Admitting: *Deleted

## 2013-01-10 ENCOUNTER — Encounter: Payer: Self-pay | Admitting: Sports Medicine

## 2013-01-10 ENCOUNTER — Ambulatory Visit (INDEPENDENT_AMBULATORY_CARE_PROVIDER_SITE_OTHER): Payer: BC Managed Care – PPO | Admitting: Sports Medicine

## 2013-01-10 VITALS — BP 124/72 | HR 73 | Temp 98.1°F | Ht 67.0 in | Wt 266.2 lb

## 2013-01-10 DIAGNOSIS — L603 Nail dystrophy: Secondary | ICD-10-CM

## 2013-01-10 DIAGNOSIS — R300 Dysuria: Secondary | ICD-10-CM

## 2013-01-10 DIAGNOSIS — E109 Type 1 diabetes mellitus without complications: Secondary | ICD-10-CM

## 2013-01-10 DIAGNOSIS — E039 Hypothyroidism, unspecified: Secondary | ICD-10-CM

## 2013-01-10 DIAGNOSIS — E785 Hyperlipidemia, unspecified: Secondary | ICD-10-CM

## 2013-01-10 DIAGNOSIS — L608 Other nail disorders: Secondary | ICD-10-CM

## 2013-01-10 DIAGNOSIS — I10 Essential (primary) hypertension: Secondary | ICD-10-CM

## 2013-01-10 LAB — POCT URINALYSIS DIPSTICK
Glucose, UA: 500
Ketones, UA: NEGATIVE
Protein, UA: NEGATIVE
Urobilinogen, UA: 0.2

## 2013-01-10 LAB — POCT UA - MICROSCOPIC ONLY

## 2013-01-10 LAB — GLUCOSE, CAPILLARY: Glucose-Capillary: 269 mg/dL — ABNORMAL HIGH (ref 70–99)

## 2013-01-10 NOTE — Progress Notes (Signed)
  [facility]  Patient name: [name] MRN [id number]  Date of birth: 18-Dec-1951  CC & HPI:  [name] is a 62 y.o. female presenting today for follow up of:  # Diabetes: followed by Endocrinology.  Next appointment in 3 months.  Being seen q 6 months.   #  HYPERTENSION: taking medications as instructed, no medication side effects noted, no chest pain on exertion, + dyspnea on exertion and no swelling of ankles. No orthopnea No neurologic symptoms  ------------------------------------------------------------------------------------------------------------------ Medication Compliance: compliant most of the time  Diet Compliance: compliant most of the time  ------------------------------------------------------------------------------------------------------------------ New Concerns:  # Dysuria: Recently treated for UTI like symptoms in urgent care.  + dysuria and frequency.  Has been evaluated by Urology in past   # Toe Nail Dystrophy:  Having trouble trimming toes appropriately.  No foot wounds but did cut L 2nd toe short and caused some bleeding previously.  Cannot take on and off shoes without assistance.   ROS:  PER HPI  Pertinent History Reviewed:  Medical & Surgical Hx:  Reviewed: Significant for HLD, Hypertension, Type 1 DM,  Medications: Reviewed & Updated - see associated section Social History: Reviewed -  reports that she has never smoked. She does not have any smokeless tobacco history on file.   Objective Findings:  Vitals:  Filed Vitals:   01/10/13 1528  BP: 151/70  Pulse: 73  Temp: 98.1 F (36.7 C)    PE: GENERAL:  Adult obese female. In no discomfort; no respiratory distress. PSYCH: Alert and appropriately interactive; Insight:Good   H&N: AT/, trachea midline EENT:  MMM, no scleral icterus, EOMi HEART: RRR, S1/S2 heard, no murmur LUNGS: CTA B, no wheezes, no crackles EXTREMITIES: Moves all 4 extremities spontaneously, warm well  perfused, no edema, bilateral DP and PT pulses 2/4.   Foot Exam: Sensation intact to monofilament testing diffusely.  L second toe with small amount of blood on distal aspect around nail bed clean and well healing.  Bunion formation on R great toe with callus formation, no erythema, or breakdown.  Nail dystrophy on B great toes.  No ingrown nails,     Assessment & Plan:

## 2013-01-10 NOTE — Assessment & Plan Note (Signed)
tsh next week Forward labs to Dr Talmage Nap

## 2013-01-10 NOTE — Assessment & Plan Note (Signed)
Toe nails trimmed today using clippers.  Plan to RTC q 2-3 months for nail maintenance.

## 2013-01-10 NOTE — Assessment & Plan Note (Signed)
FLP next week

## 2013-01-10 NOTE — Assessment & Plan Note (Addendum)
500 of Glucose in urine.  Followed by Dr. Talmage Nap.  F/u appointment soon. Compared home glucose monitor to office POCT CBGs coordinate ~ 260s Fasting Risk stratification labs next week

## 2013-01-10 NOTE — Assessment & Plan Note (Signed)
Recurrent over the past 2 weeks.  Previously treated with Keflex.   Will re-culture urine today given similar findings to prior UA and treatment at urgent care. If negative culture will refer to Urology for further evaluation and consideration of IC. If positive will treat and have follow UA to assess for RBCs.

## 2013-01-10 NOTE — Patient Instructions (Signed)
It was nice to see you today.   Today we discussed: 1. Dysuria  I am re culturing your urine.  If it is negative we will be referring you to urology for further evaluation.  - Urinalysis Dipstick  - POCT UA - Microscopic Only  - Urine culture  2. Nail dystrophy  Please return to see me every 2-3 months to help trim your toe nails  3. Type 1 diabetes mellitus  Please follow up with your Endocrinologist sooner than previously scheduled.  Your sugars are high today  Also return next week to have your blood drawn first thing in the morning.   - Glucose (CBG)   Please plan to return to see me in 2-3 months.  If you need anything prior to seeing me please call the clinic.  Please Bring all medications with you to each appointment.

## 2013-01-10 NOTE — Assessment & Plan Note (Addendum)
BP better on Re-check.   Refilled Imdur; Follow up with Dr. Jacinto Halim in 1 month.   No Angina. Dyspnea on exertion unchanged

## 2013-01-13 ENCOUNTER — Other Ambulatory Visit: Payer: BC Managed Care – PPO

## 2013-01-13 ENCOUNTER — Telehealth: Payer: Self-pay | Admitting: Sports Medicine

## 2013-01-13 DIAGNOSIS — E039 Hypothyroidism, unspecified: Secondary | ICD-10-CM

## 2013-01-13 DIAGNOSIS — N390 Urinary tract infection, site not specified: Secondary | ICD-10-CM

## 2013-01-13 DIAGNOSIS — E785 Hyperlipidemia, unspecified: Secondary | ICD-10-CM

## 2013-01-13 DIAGNOSIS — E109 Type 1 diabetes mellitus without complications: Secondary | ICD-10-CM

## 2013-01-13 LAB — BASIC METABOLIC PANEL
CO2: 27 mEq/L (ref 19–32)
Chloride: 106 mEq/L (ref 96–112)
Sodium: 140 mEq/L (ref 135–145)

## 2013-01-13 LAB — LIPID PANEL
HDL: 47 mg/dL (ref >39)
LDL Cholesterol: 58 mg/dL (ref 0–99)
Total CHOL/HDL Ratio: 2.5 Ratio

## 2013-01-13 LAB — TSH: TSH: 1.82 u[IU]/mL (ref 0.350–4.500)

## 2013-01-13 MED ORDER — FLUCONAZOLE 150 MG PO TABS: 150.0000 mg | ORAL_TABLET | Freq: Once | ORAL | Status: DC

## 2013-01-13 MED ORDER — CEPHALEXIN 500 MG PO CAPS: 500.0000 mg | ORAL_CAPSULE | Freq: Four times a day (QID) | ORAL | Status: DC

## 2013-01-13 NOTE — Telephone Encounter (Signed)
Keflex 4 times per day Also will give fluconazole

## 2013-01-13 NOTE — Assessment & Plan Note (Signed)
Keflex qid

## 2013-01-13 NOTE — Progress Notes (Signed)
BMP,FLP AND TSH DONE TODAY [name]

## 2013-01-13 NOTE — Telephone Encounter (Signed)
Called patient and informed her of below. She will call us back if after abx treatment if it does not help

## 2013-01-13 NOTE — Telephone Encounter (Signed)
Pt wanted to rec'd a call regarding her results

## 2013-01-21 ENCOUNTER — Encounter: Payer: Self-pay | Admitting: Sports Medicine

## 2013-01-23 ENCOUNTER — Telehealth: Payer: Self-pay | Admitting: Sports Medicine

## 2013-01-23 DIAGNOSIS — I10 Essential (primary) hypertension: Secondary | ICD-10-CM

## 2013-01-23 NOTE — Telephone Encounter (Signed)
Pt is needing refill on her Isosorbide -she has not had it filled here yet. [facility] [address]  Also is asking about her lab results - pls advise  And wants to get set up with mychart- needs to know how to log in.

## 2013-01-24 MED ORDER — ISOSORBIDE MONONITRATE ER 30 MG PO TB24: 30.0000 mg | ORAL_TABLET | Freq: Every day | ORAL | Status: DC

## 2013-01-24 NOTE — Telephone Encounter (Signed)
Rx filled, Letter in mail with all results. Everything looked good on labs

## 2013-01-24 NOTE — Telephone Encounter (Signed)
Spoke with patient and informed her of below message 

## 2013-01-26 ENCOUNTER — Other Ambulatory Visit: Payer: Self-pay | Admitting: Sports Medicine

## 2013-02-10 ENCOUNTER — Other Ambulatory Visit: Payer: Self-pay | Admitting: Sports Medicine

## 2013-02-26 ENCOUNTER — Other Ambulatory Visit: Payer: Self-pay | Admitting: Sports Medicine

## 2013-03-01 ENCOUNTER — Other Ambulatory Visit: Payer: Self-pay | Admitting: Sports Medicine

## 2013-03-07 ENCOUNTER — Telehealth: Payer: Self-pay | Admitting: Sports Medicine

## 2013-03-07 NOTE — Telephone Encounter (Signed)
Clinical info completed.  Form placed in Dr. Janeece Riggers box for completion and signature.  Gaylene Brooks, RN

## 2013-03-07 NOTE — Telephone Encounter (Signed)
Patient dropped off handicapped placard form to be filled out.  Please call her when completed. °

## 2013-03-12 NOTE — Telephone Encounter (Signed)
[name] notified Handicap Placard Application is completed and ready to be picked up at front desk.  Ileana Ladd

## 2013-04-28 ENCOUNTER — Encounter: Payer: Self-pay | Admitting: Sports Medicine

## 2013-04-28 ENCOUNTER — Ambulatory Visit (INDEPENDENT_AMBULATORY_CARE_PROVIDER_SITE_OTHER): Payer: BC Managed Care – PPO | Admitting: Sports Medicine

## 2013-04-28 VITALS — BP 155/63 | HR 62 | Ht 67.0 in | Wt 272.0 lb

## 2013-04-28 DIAGNOSIS — E109 Type 1 diabetes mellitus without complications: Secondary | ICD-10-CM

## 2013-04-28 DIAGNOSIS — I10 Essential (primary) hypertension: Secondary | ICD-10-CM

## 2013-04-28 DIAGNOSIS — M549 Dorsalgia, unspecified: Secondary | ICD-10-CM

## 2013-04-28 DIAGNOSIS — L608 Other nail disorders: Secondary | ICD-10-CM

## 2013-04-28 DIAGNOSIS — L603 Nail dystrophy: Secondary | ICD-10-CM

## 2013-04-28 MED ORDER — FUROSEMIDE 20 MG PO TABS: 20.0000 mg | ORAL_TABLET | Freq: Every day | ORAL | Status: DC

## 2013-04-28 NOTE — Patient Instructions (Addendum)
It was nice to see you today.   Today we discussed: 1. Back pain without radiation Please do the 2 exercises I showed you today. YOu can take Tylenol Arthritis or Aleeve (1 pill twice a day as needed) I have referred you to PT, they will be calling you. - Ambulatory referral to Physical Therapy  2. Hypertension Please start: - furosemide (LASIX) 20 MG tablet; Take 1 tablet (20 mg total) by mouth daily.  Dispense: 30 tablet; Refill: 3   Please plan to return to see me in 1 month.  If you need anything prior to seeing me please call the clinic.  Please Bring all medications with you to each appointment.

## 2013-04-28 NOTE — Assessment & Plan Note (Addendum)
Improved A1c to 8.  On an insulin pump.  Has noticed some weight gain may be from excess fluid.  See hypertension. Discussed increasing NEAT; we'll try to move and/or stretch with each commercial wall watching TV.

## 2013-04-28 NOTE — Assessment & Plan Note (Addendum)
Volume overloaded,  No chest pain, some DOE, no orthpnea On statin and aspirin. Add lasix > if not improved refer to cardiology. > Consider addition of scrotal lactone as well.  Will need to stop potassium

## 2013-04-28 NOTE — Assessment & Plan Note (Signed)
No wounds Trimmed today

## 2013-04-28 NOTE — Progress Notes (Signed)
  [facility]  Patient name: [name] MRN [id number]  Date of birth: Jun 07, 1951  CC & HPI:  [name] is a 62 y.o. female presenting today for   # Low Back Pain:  Patient reports that since she's been more active she has been noticing a tightening her back.  This occurs with activity.  no radiation.  She has no overt weakness.  She has no falls.  Described as an ache.  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  She desires to be even more active.    # Bilateral nail dystrophy: Patient is unable to reach her toes.  She does have diabetes.  She reports having occasional nicking if she tries herself.  Requesting trimming.  Tolerated well the last time.  #  Hypertension - chronic problem, poorly controlled.    Home BP checks/ranges: not checking  no orthostasis  no chest pain, noticing dyspnea on exertion now that she is becoming more active.  No chest pain, no orthopnea/PND, new peripheral edema,   no episodes of unilateral weakness, dysarthria or acute visual changes  Weight gain  # Diabetes: Followed by Joelene Millin (endocrinology), on an insulin pump.  Reports her A1c is down to 8 from 10.  Has noticed some weight gain.  # Hypothyroid: On stable dose followed by Dr. Talmage Nap  ROS:  Per HPI  Pertinent History Reviewed:  Medical & Surgical Hx:  Reviewed: Significant for Type 1 DM.  HLD, Hypothryroid, GERD, Medications: Reviewed & Updated - see associated section Social History: Reviewed -  reports that she has never smoked. She does not have any smokeless tobacco history on file.  Objective Findings:  Vitals: BP 155/63  Pulse 62  Ht 5\' 7"  (1.702 m)  Wt 272 lb (123.378 kg)  BMI 42.59 kg/m2   PE: GENERAL:  Adult obese AA  female. In no discomfort; no respiratory distress. PSYCH: Alert and appropriately interactive; Insight:Good,     H&N: AT/[place], trachea midline, positive JVD and had a jugular  reflux. EENT:  MMM, no scleral icterus, EOMi HEART: RRR, S1/S2 heard, no murmur LUNGS: CTA B, no wheezes, no crackles EXTREMITIES: Moves all 4 extremities spontaneously, warm well perfused, 2+/4 edema, bilateral DP and PT pulses 2/4.  Bilateral nail dystrophy.  No open wounds; Or eschars     Assessment & Plan:

## 2013-04-28 NOTE — Assessment & Plan Note (Signed)
Given hula hoop & hamstring stretch Refer to PT

## 2013-05-01 ENCOUNTER — Telehealth: Payer: Self-pay | Admitting: Sports Medicine

## 2013-05-01 NOTE — Telephone Encounter (Signed)
Spoke with patient and informed her that referral was put in 5/5 and it may take to two weeks to hear from them

## 2013-05-01 NOTE — Telephone Encounter (Signed)
The patient is calling because she hasn't heard from the Physical Therapy people yet and she would like someone to check the status of the referral.

## 2013-05-13 ENCOUNTER — Ambulatory Visit: Payer: BC Managed Care – PPO

## 2013-05-14 ENCOUNTER — Ambulatory Visit: Payer: BC Managed Care – PPO | Attending: Sports Medicine

## 2013-05-14 DIAGNOSIS — M545 Low back pain, unspecified: Secondary | ICD-10-CM | POA: Insufficient documentation

## 2013-05-14 DIAGNOSIS — M25659 Stiffness of unspecified hip, not elsewhere classified: Secondary | ICD-10-CM | POA: Insufficient documentation

## 2013-05-14 DIAGNOSIS — R5381 Other malaise: Secondary | ICD-10-CM | POA: Insufficient documentation

## 2013-05-14 DIAGNOSIS — IMO0001 Reserved for inherently not codable concepts without codable children: Secondary | ICD-10-CM | POA: Insufficient documentation

## 2013-05-26 ENCOUNTER — Ambulatory Visit: Payer: BC Managed Care – PPO | Attending: Sports Medicine | Admitting: Rehabilitation

## 2013-05-26 DIAGNOSIS — R5381 Other malaise: Secondary | ICD-10-CM | POA: Insufficient documentation

## 2013-05-26 DIAGNOSIS — M25659 Stiffness of unspecified hip, not elsewhere classified: Secondary | ICD-10-CM | POA: Insufficient documentation

## 2013-05-26 DIAGNOSIS — M545 Low back pain, unspecified: Secondary | ICD-10-CM | POA: Insufficient documentation

## 2013-05-26 DIAGNOSIS — IMO0001 Reserved for inherently not codable concepts without codable children: Secondary | ICD-10-CM | POA: Insufficient documentation

## 2013-05-29 ENCOUNTER — Ambulatory Visit (INDEPENDENT_AMBULATORY_CARE_PROVIDER_SITE_OTHER): Payer: BC Managed Care – PPO | Admitting: Sports Medicine

## 2013-05-29 ENCOUNTER — Encounter: Payer: Self-pay | Admitting: Sports Medicine

## 2013-05-29 VITALS — BP 133/52 | HR 63 | Temp 98.6°F | Ht 67.0 in | Wt 272.2 lb

## 2013-05-29 DIAGNOSIS — I10 Essential (primary) hypertension: Secondary | ICD-10-CM

## 2013-05-29 DIAGNOSIS — E66813 Obesity, class 3: Secondary | ICD-10-CM | POA: Insufficient documentation

## 2013-05-29 DIAGNOSIS — E109 Type 1 diabetes mellitus without complications: Secondary | ICD-10-CM

## 2013-05-29 MED ORDER — FUROSEMIDE 40 MG PO TABS: 40.0000 mg | ORAL_TABLET | Freq: Every day | ORAL | Status: DC

## 2013-05-29 NOTE — Assessment & Plan Note (Signed)
Significantly improved on Lasix, increase to 40mg  Check BMET today

## 2013-05-29 NOTE — Assessment & Plan Note (Signed)
Reports improved control. Going to be changing devices F/u with Dr. Talmage Nap; A1c Pending

## 2013-05-29 NOTE — Patient Instructions (Addendum)
It was nice to see you today.   Today we discussed: Type 1 diabetes mellitus, Hypertension, Obesity Please increase your lasix daily.  I am checking your labs again today.  If anything is abnormal we will contact you. - furosemide (LASIX) 40 MG tablet; Take 1 tablet (40 mg total) by mouth daily.  Dispense: 30 tablet; Refill: 3 - Basic Metabolic Panel  Please look at this website.  I am not endorsing his products but do feel that he has great information on his website:   [email or web address]    Please plan to return to see me in 3 months.  If you need anything prior to seeing me please call the clinic.  Please Bring all medications with you to each appointment.

## 2013-05-29 NOTE — Assessment & Plan Note (Signed)
Patient continuing to work on her weight loss with exercise and diet control.  She has been stable and her weight.  She is likely having some increased muscle mass.  We'll continue to monitor and followup in 3 months.  Encouraged to continue activity and cut out of all sweets.  Given resources once again

## 2013-05-29 NOTE — Progress Notes (Signed)
  [facility]  Patient name: [name] MRN [id number]  Date of birth: 11-Mar-1951  CC & HPI:  [name] is a 62 y.o. female presenting today for follow up of:  #  Hypertension - chronic problem, adequately controlled.    Home BP checks/ranges:   no orthostasis  no chest pain, no dyspnea on exertion, no orthopnea/PND, slightly worsening LE edema, doesn't seem that Lasix is working any longer  no episodes of unilateral weakness, dysarthria or acute visual changes   #  Diabetes - chronic problem, poorly controlled. - followed by Dr. Talmage Nap with Endocrinology  no hypoglycemic symptoms/episodes.   no poluria, no polydipsia,  no new visual problems  last eye exam: >6 months ago  LE dysesthesias: Mild - not self limiting  self foot checks performed: Daily   # Weight:  Patient has been working on losing weight has been increasing her activity.  She does not notice a significant change in her weight and is slightly frustrated by this.  She has been watching her dietary intake closer as well ------------------------------------------------------------------------------------------------------------------ Medication Compliance: compliant most of the time  Diet Compliance: compliant most of the time  ROS:  PER HPI  Pertinent History Reviewed:  Medical & Surgical Hx:  Reviewed: Significant for insulin dep diabetes s/p thyroidectomy for thyroid CA now on thyroid supplementation and reports euthyroid.   Medications: Reviewed & Updated - See associated section in EMR Social History: Reviewed -  reports that she has never smoked. She does not have any smokeless tobacco history on file.   Objective Findings:  Vitals: BP 133/52  Pulse 63  Temp(Src) 98.6 F (37 C) (Oral)  Ht 5\' 7"  (1.702 m)  Wt 272 lb 3.2 oz (123.469 kg)  BMI 42.62 kg/m2  PE: GENERAL:  Adult AA   female. In no discomfort; no respiratory distress. PSYCH: Alert and appropriately interactive;  Insight:Good   H&N: AT/[place], trachea midline EENT:  MMM, no scleral icterus, EOMi HEART: RRR, S1/S2 heard, no murmur LUNGS: CTA B, no wheezes, no crackles EXTREMITIES: Moves all 4 extremities spontaneously, warm well perfused, 2+/4 B edema, bilateral DP and PT pulses 2/4.  Dystrophic nails but well groomed   Assessment & Plan:

## 2013-06-02 ENCOUNTER — Ambulatory Visit: Payer: BC Managed Care – PPO

## 2013-06-09 ENCOUNTER — Encounter: Payer: BC Managed Care – PPO | Admitting: Physical Therapy

## 2013-06-10 ENCOUNTER — Encounter: Payer: Self-pay | Admitting: Sports Medicine

## 2013-07-25 ENCOUNTER — Other Ambulatory Visit: Payer: Self-pay | Admitting: Sports Medicine

## 2013-08-20 ENCOUNTER — Ambulatory Visit (INDEPENDENT_AMBULATORY_CARE_PROVIDER_SITE_OTHER): Payer: BC Managed Care – PPO | Admitting: Sports Medicine

## 2013-08-20 ENCOUNTER — Encounter: Payer: Self-pay | Admitting: Sports Medicine

## 2013-08-20 VITALS — BP 102/57 | HR 68 | Temp 98.1°F | Ht 67.0 in | Wt 271.3 lb

## 2013-08-20 DIAGNOSIS — R609 Edema, unspecified: Secondary | ICD-10-CM

## 2013-08-20 DIAGNOSIS — E109 Type 1 diabetes mellitus without complications: Secondary | ICD-10-CM

## 2013-08-20 DIAGNOSIS — Z23 Encounter for immunization: Secondary | ICD-10-CM

## 2013-08-20 DIAGNOSIS — Z299 Encounter for prophylactic measures, unspecified: Secondary | ICD-10-CM | POA: Insufficient documentation

## 2013-08-20 DIAGNOSIS — R600 Localized edema: Secondary | ICD-10-CM

## 2013-08-20 DIAGNOSIS — Z0000 Encounter for general adult medical examination without abnormal findings: Secondary | ICD-10-CM

## 2013-08-20 LAB — POCT GLYCOSYLATED HEMOGLOBIN (HGB A1C): Hemoglobin A1C: 9.2

## 2013-08-20 MED ORDER — ATORVASTATIN CALCIUM 20 MG PO TABS: 20.0000 mg | ORAL_TABLET | Freq: Every day | ORAL | Status: DC

## 2013-08-20 NOTE — Progress Notes (Signed)
  [facility]  Patient name: [name] MRN [id number]  Date of birth: [birth year]  CC & HPI:  [name] is a 62 y.o. female presenting to clinic.  #  Hypertension - chronic problem, well controlled.    no orthostasis,   no peripheral edema  no chest pain, no dyspnea on exertion, no orthopnea/PND  no episodes of unilateral weakness, dysarthria or acute visual changes  #  Diabetes - chronic problem, poorly controlled. - Followed by Dr. Lisabeth Devoid  Blood Sugar checks/ranges: only checking 1-2X per day   no hypoglycemic symptoms/episodes.   no poluria, no polydipsia,  no new visual problems last eye exam: <6 months ago  LE dysesthesias: None  self foot checks performed: Daily  #  Lower Extremity Edema - chronic problem, well controlled.    Reports has significantly improved.  She reports that her swelling does seem to get better at the end of the day after she does her feet up.  She denies any type of claudication like symptoms  [facility] Compliance Diet: noncompliant some of the time, noncompliant much of the time  Exercise: noncompliant much of the time, reports that she is at significant stress a sterile light since her son move back home with his wife.    MEDS: compliant most of the time, patient did receive a new glucometer but seems to be working better for her.       ROS:  PER HPI  Pertinent History Reviewed:  Medical & Surgical Hx:  Reviewed: Significant for type 1 diabetes, morbid obesity, hypothyroidism Medications: Reviewed & Updated - see associated section Social History: Reviewed -  reports that she has never smoked. She does not have any smokeless tobacco history on file.  Objective Findings:  Vitals: BP 102/57  Pulse 68  Temp(Src) 98.1 F (36.7 C) (Oral)  Ht 5\' 7"  (1.702 m)  Wt 271 lb 4.8 oz (123.061 kg)  BMI 42.48 kg/m2 PE: GENERAL:  adult obese female female. In no discomfort; no respiratory distress  PSYCH:  alert and  appropriate, good insight   HNEENT:   no thyroid goiter appreciated   CARDIO:  RRR, S1/S2 heard, no murmur  LUNGS:  CTA B, no wheezes, no crackles  ABDOMEN:    EXTREM:  patient has one plus pitting edema in the bilateral lower extremities.  This does seem to be significant improved from her last visit.  Her pulses are 1+ out of 4 bilaterally with good capillary refill.  There no ulcers of her feet .   Sensory testing with monofilament was performed and she has preserved sensation throughout her entire foot.    GU:   SKIN:  there is no skin breakdown of the foot, there are no ulcers.  NEUROMSK:     Assessment & Plan:   1. Type 1 diabetes mellitus    See problem associated charting

## 2013-08-20 NOTE — Assessment & Plan Note (Signed)
Continue to encourage therapeutic lifestyle change.  She reports she has had significant increase in her stress since her son move at home and this is significantly interfered with her ability to check her sugars and to perform exercise.  Just reports that she has not been cooking room food quite as often in cases may be contributing to her continued struggle with weight loss.

## 2013-08-20 NOTE — Assessment & Plan Note (Signed)
Low-grade compression socks

## 2013-08-20 NOTE — Patient Instructions (Signed)
It was great to see you today.  Please focus on checking her sugars and programming your meter on a more regular basis.  I am encouraged by the fact that you have lost some weight and 1 she did continue with your walking and nutrition changes.  Remember to limit the amount of sweets that you have on a daily basis and cut out any artificial sweeteners as well.  For your lower extremity edema I have gotten a prescription for compression Sikes.  Your blood pressure is doing well and I do not think that increasing her Lasix at this time would be helpful.

## 2013-08-20 NOTE — Assessment & Plan Note (Addendum)
Influenza vaccine today. Discuss colonoscopy, Zostavax next visit.

## 2013-10-06 ENCOUNTER — Other Ambulatory Visit: Payer: Self-pay | Admitting: Sports Medicine

## 2013-10-06 ENCOUNTER — Encounter: Payer: Self-pay | Admitting: Family Medicine

## 2013-10-06 ENCOUNTER — Ambulatory Visit (INDEPENDENT_AMBULATORY_CARE_PROVIDER_SITE_OTHER): Payer: BC Managed Care – PPO | Admitting: Family Medicine

## 2013-10-06 VITALS — BP 115/77 | HR 64 | Temp 97.8°F | Wt 266.0 lb

## 2013-10-06 DIAGNOSIS — R079 Chest pain, unspecified: Secondary | ICD-10-CM

## 2013-10-06 DIAGNOSIS — R0789 Other chest pain: Secondary | ICD-10-CM | POA: Insufficient documentation

## 2013-10-06 NOTE — Patient Instructions (Signed)
I am sorry you are not feeling well.  We will check your blood work to see if your heart enzyme is elevated. We will call you if it is elevated and you will need to go to the emergency room immediately. No news is good news!  This could either be your heartburn (try over the counter Maalox or Mylanta) or costochondritis. I have put some information below on that. Take Tylenol and use heating pad as needed.  Come back on Wednesday if you are not feeling better.  Ho Parisi M. Joanie Duprey, M.D.  Costochondritis Costochondritis is a condition in which the tissue (cartilage) that connects your ribs with your breastbone (sternum) becomes irritated and causes chest pain.  HOME CARE  Avoid activities that wear you out.  Do not strain your ribs. Avoid activities that use your:  Chest.  Belly.  Side muscles.  Put ice on the area.  Put ice in a plastic bag.  Place a towel betwen your skin and the bag.  Leave the ice on for 15-20 minutes, 3-4 times a day.  Only take medicine as told by your doctor. GET HELP RIGHT AWAY IF:   Your pain gets worse.  You are very uncomfortable.  You have a fever.  You have trouble breathing.  You cough up blood.  You start sweating or throwing up (vomiting).  You develop new, unexplained symptoms. MAKE SURE YOU:   Understand these instructions.  Will watch your condition.  Will get help right away if you are not doing well or get worse. Document Released: 05/29/2008 Document Revised: 03/04/2012 Document Reviewed: 05/29/2008 [facility] Patient Information 2014 [place], [place].

## 2013-10-06 NOTE — Progress Notes (Signed)
Patient ID: [name], female   DOB: [birth year], 62 y.o.   MRN: [id number]  [facility] Saylah Ketner M. Bryler Dibble, MD Phone: [phone]   Subjective: HPI: Patient is a 62 y.o. female presenting to clinic today for same day appointment for "heartburn".  She states for last 2 weeks she has had increased indigestion. This morning she woke up with tightness in chest and felt like she needed to belch. Felt a little relief after drinking something and belching, but continues to have pain. Chest discomfort is described as "tight" (no burning, no pressure, no stabbing), which is worse with eating. Pain is not worse with exertion, does not radiate. However, she does feel more fatigued than usual today and states she just does not feel like herself. She is taking her prilosec as prescribed, as well as her other medications.  History Reviewed: Never smoker. Health Maintenance: UTD on immunizations  ROS: Please see HPI above.  Objective: Office vital signs reviewed. BP 115/77  Pulse 64  Temp(Src) 97.8 F (36.6 C) (Oral)  Wt 266 lb (120.657 kg)  BMI 41.65 kg/m2  Physical Examination:  General: Awake, alert. No distress, not diaphoretic HEENT: Atraumatic, normocephalic. MMM Pulm: CTAB, no wheezes. Good effort Cardio: RRR, no murmurs appreciated. Intact distal pulses. TTP of midsternum Abdomen: soft, nontender, nondistended. Extremities: mild traceedema Neuro: Grossly intact  Assessment: 62 y.o. female with chest discomfort  Plan: See Problem List and After Visit Summary

## 2013-10-06 NOTE — Assessment & Plan Note (Signed)
Patient is a 61 yo F with cardiac risk factors presenting for "chest tightness." EKG wnl, low probability based on story. DDx includes  exacerbation of GERD vs. costochondritis (most likely based on TTP on exam.) Discussed in depth with Dr. Leveda Anna and patient. Would be safest for her to go to the ED for further work up, but we compromised on sending stat Troponin from clinic. Lab to page after hours pager with result. If positive she will return to the ED.  Use Mylanta or Maalox at home. Can try Tylenol and heat for discomfort. (Avoiding NSAID in setting of possible GERD exacerbation.)  F/u in 48 hours if not improved.

## 2013-10-09 ENCOUNTER — Telehealth: Payer: Self-pay | Admitting: Sports Medicine

## 2013-10-09 NOTE — Telephone Encounter (Signed)
[name] called her form GI's office to schedule her colonoscopy and was told that they needed a referral sent to them as well as list of medications she's currently taking.  Please send directly to the office of Dr. Donnal Moat.  Have an appt for a consultation on Nov the 11th at 8:15.  Would like to have the referral there before then.

## 2013-10-10 ENCOUNTER — Other Ambulatory Visit: Payer: Self-pay | Admitting: Sports Medicine

## 2013-10-10 DIAGNOSIS — Z1211 Encounter for screening for malignant neoplasm of colon: Secondary | ICD-10-CM

## 2013-10-10 NOTE — Telephone Encounter (Signed)
Called and gave referral ok and sent over copy of  meds and labs

## 2013-10-20 ENCOUNTER — Other Ambulatory Visit: Payer: Self-pay | Admitting: Sports Medicine

## 2013-12-17 ENCOUNTER — Other Ambulatory Visit: Payer: Self-pay | Admitting: Sports Medicine

## 2013-12-24 ENCOUNTER — Encounter: Payer: Self-pay | Admitting: Family Medicine

## 2013-12-24 ENCOUNTER — Ambulatory Visit (INDEPENDENT_AMBULATORY_CARE_PROVIDER_SITE_OTHER): Payer: BC Managed Care – PPO | Admitting: Family Medicine

## 2013-12-24 VITALS — BP 119/72 | HR 71 | Temp 98.4°F | Ht 67.0 in | Wt 268.0 lb

## 2013-12-24 DIAGNOSIS — N390 Urinary tract infection, site not specified: Secondary | ICD-10-CM | POA: Insufficient documentation

## 2013-12-24 DIAGNOSIS — E109 Type 1 diabetes mellitus without complications: Secondary | ICD-10-CM

## 2013-12-24 DIAGNOSIS — R300 Dysuria: Secondary | ICD-10-CM

## 2013-12-24 DIAGNOSIS — J988 Other specified respiratory disorders: Secondary | ICD-10-CM

## 2013-12-24 DIAGNOSIS — J22 Unspecified acute lower respiratory infection: Secondary | ICD-10-CM

## 2013-12-24 LAB — POCT URINALYSIS DIPSTICK
Glucose, UA: NEGATIVE
Ketones, UA: NEGATIVE
Protein, UA: NEGATIVE
Urobilinogen, UA: 0.2

## 2013-12-24 LAB — POCT GLYCOSYLATED HEMOGLOBIN (HGB A1C): Hemoglobin A1C: 9.4

## 2013-12-24 LAB — POCT UA - MICROSCOPIC ONLY

## 2013-12-24 MED ORDER — CEPHALEXIN 500 MG PO CAPS: 500.0000 mg | ORAL_CAPSULE | Freq: Two times a day (BID) | ORAL | Status: DC

## 2013-12-24 MED ORDER — FLUCONAZOLE 150 MG PO TABS: 150.0000 mg | ORAL_TABLET | Freq: Once | ORAL | Status: DC

## 2013-12-24 NOTE — Patient Instructions (Signed)
You seem to have a simple bladder infection, also known as a UTI or urinary tract infection. The cephalexin is the antibiotic to treat the infection.  Fill that right away. I also sent in a prescription for diflucan for a yeast infection.  Do not fill that prescription unless you develop symptoms of yeast infection. The nurse will give you information about getting a mammogram.   I am glad you are planning to get a colonoscopy.  Please let Dr. Berline Chough know when it is done so he can update your records.

## 2013-12-25 NOTE — Progress Notes (Signed)
   Subjective:    Patient ID: [name], female    DOB: 07-01-1951, 63 y.o.   MRN: [id number]  HPI Two issues Resp infection of 5 days duration with worsening cough.  Never smoker or asthma.  No documented fever.  Recurrent UTIs and hx of hematuria.  Feels she has a UTI again.  Noticed some pink urine.    Review of Systems     Objective:   Physical Exam TMs OK Throat minimal cobblestone. Neck no sig adenopathy Lung diffuse end exp wheeze Abd benign. No CVA tenderness.       Assessment & Plan:

## 2013-12-25 NOTE — Assessment & Plan Note (Signed)
UA consistent with UTI.  Rx keflex.  Diflucan if needed.

## 2013-12-25 NOTE — Assessment & Plan Note (Signed)
Likely viral.  Antibiotic focus will be on treating UTI

## 2013-12-25 NOTE — Progress Notes (Signed)
   Subjective:    Patient ID: [name], female    DOB: Oct 27, 1951, 63 y.o.   MRN: [id number]  HPI   Error.  This was a simple UTI and patient did not have a concominent lower respiratory track infection.    Review of Systems     Objective:   Physical Exam        Assessment & Plan:

## 2013-12-26 ENCOUNTER — Encounter: Payer: Self-pay | Admitting: Family Medicine

## 2014-01-12 ENCOUNTER — Other Ambulatory Visit: Payer: Self-pay | Admitting: Sports Medicine

## 2014-02-11 ENCOUNTER — Other Ambulatory Visit: Payer: Self-pay | Admitting: Sports Medicine

## 2014-02-12 ENCOUNTER — Other Ambulatory Visit: Payer: Self-pay | Admitting: Sports Medicine

## 2014-03-03 ENCOUNTER — Other Ambulatory Visit: Payer: Self-pay | Admitting: Sports Medicine

## 2014-03-04 ENCOUNTER — Ambulatory Visit (INDEPENDENT_AMBULATORY_CARE_PROVIDER_SITE_OTHER): Payer: BC Managed Care – PPO | Admitting: Sports Medicine

## 2014-03-04 ENCOUNTER — Other Ambulatory Visit: Payer: Self-pay | Admitting: Sports Medicine

## 2014-03-04 ENCOUNTER — Encounter: Payer: Self-pay | Admitting: Sports Medicine

## 2014-03-04 VITALS — BP 129/52 | HR 77 | Temp 98.6°F | Ht 67.0 in | Wt 256.0 lb

## 2014-03-04 DIAGNOSIS — Z0000 Encounter for general adult medical examination without abnormal findings: Secondary | ICD-10-CM

## 2014-03-04 DIAGNOSIS — E109 Type 1 diabetes mellitus without complications: Secondary | ICD-10-CM

## 2014-03-04 DIAGNOSIS — E039 Hypothyroidism, unspecified: Secondary | ICD-10-CM

## 2014-03-04 DIAGNOSIS — K219 Gastro-esophageal reflux disease without esophagitis: Secondary | ICD-10-CM

## 2014-03-04 MED ORDER — PANTOPRAZOLE SODIUM 40 MG PO TBEC: 40.0000 mg | DELAYED_RELEASE_TABLET | Freq: Every day | ORAL | Status: DC

## 2014-03-04 NOTE — Patient Instructions (Signed)
Screening colonoscopy will need to be arranged with [facility].

## 2014-03-04 NOTE — Progress Notes (Signed)
[name] - 63 y.o. female MRN [id number]  Date of birth: 1951-03-13  SUBJECTIVE:  Pt is here today with a chief complaint of: Medical Managment of Chronic Issues   She is here today for follow up of chronic medical conditions including need for colonoscopy.   Compliant with all medications   Working on TLC (Therapeutic Lifestyle Changes) For further subjective including (HPI, Interval History & ROS) please see problem based charting  HISTORY:  Recent Labs  05/29/13 0837 08/20/13 1353 12/24/13 1431  HGBA1C 8.6 9.2 9.4   Wt Readings from Last 3 Encounters:  03/04/14 256 lb (116.121 kg)  12/24/13 268 lb (121.564 kg)  10/06/13 266 lb (120.657 kg)   BP Readings from Last 3 Encounters:  03/04/14 129/52  12/24/13 119/72  10/06/13 115/77    History  Smoking status  . Never Smoker   Smokeless tobacco  . Not on file   Health Maintenance Due  Topic  . Colonoscopy   . Mammogram     }Otherwise past Medical, Surgical, Social, and Family History Reviewed per EMR Medications and Allergies reviewed and updated per below.  VITALS: BP 129/52  Pulse 77  Temp(Src) 98.6 F (37 C) (Oral)  Ht 5\' 7"  (1.702 m)  Wt 256 lb (116.121 kg)  BMI 40.09 kg/m2  PHYSICAL EXAM: GENERAL: Adult African American  female. In no discomfort; no respiratory distress  PSYCH: alert and appropriate, good insight   HNEENT: No JVD  CARDIO: RRR, S1/S2 heard, no murmur  LUNGS: CTA B, no wheezes, no crackles  ABDOMEN:  protuberant.    EXTREM:  Warm, well perfused.  Moves all 4 extremities spontaneously; no lateralization.  Distal pulses 1+/4.  Trace pretibial edema.  GU:   SKIN:     MEDICATIONS, LABS & OTHER ORDERS: Previous Medications   ACETAMINOPHEN (TYLENOL) 500 MG TABLET    Take 1,000 mg by mouth every 6 (six) hours as needed. For pain   ASPIRIN EC 81 MG TABLET    Take 1 tablet (81 mg total) by mouth daily.   ATORVASTATIN (LIPITOR) 20 MG TABLET    Take 1 tablet (20 mg total) by mouth  daily.   CALCIUM PO    Take 1 tablet by mouth daily. OTC   FUROSEMIDE (LASIX) 40 MG TABLET    TAKE 1 TABLET BY MOUTH EVERY DAY   IBUPROFEN (ADVIL,MOTRIN) 200 MG TABLET    Take 400 mg by mouth every 6 (six) hours as needed. For pain   INSULIN HUMAN (INSULIN PUMP) 100 UNIT/ML SOLN    Inject 1 each into the skin continuous. Uses novolog insulin in her pump.   ISOSORBIDE MONONITRATE (IMDUR) 30 MG 24 HR TABLET    TAKE 1 TABLET BY MOUTH EVERY DAY   LEVOTHYROXINE (SYNTHROID, LEVOTHROID) 150 MCG TABLET    TAKE 1 TABLET BY MOUTH EVERY DAY   LOSARTAN-HYDROCHLOROTHIAZIDE (HYZAAR) 50-12.5 MG PER TABLET    TAKE 1 TABLET BY MOUTH EVERY DAY   METOPROLOL SUCCINATE (TOPROL-XL) 50 MG 24 HR TABLET    Take 1 tablet (50 mg total) by mouth daily.   METOPROLOL SUCCINATE (TOPROL-XL) 50 MG 24 HR TABLET    TAKE 1 TABLET BY MOUTH DAILY WITH OR IMMEDIATELY FOLLOWING A MEAL   OMEPRAZOLE (PRILOSEC) 40 MG CAPSULE    Take 40 mg by mouth daily.   PEG 3350-KCL-NABCB-NACL-NASULF (PEG-3350/ELECTROLYTES) 236 G SOLR       POTASSIUM PO    Take 2 tablets by mouth daily. OTC   Modified Medications  No medications on file   New Prescriptions   No medications on file   Discontinued Medications   CEPHALEXIN (KEFLEX) 500 MG CAPSULE    Take 1 capsule (500 mg total) by mouth 2 (two) times daily.   FLUCONAZOLE (DIFLUCAN) 150 MG TABLET    Take 1 tablet (150 mg total) by mouth once.  No orders of the defined types were placed in this encounter.   ASSESSMENT & PLAN: See problem based charting & AVS for pt instructions.

## 2014-03-04 NOTE — Assessment & Plan Note (Addendum)
Patient has previously been seen by Dr. Collene Mares for screening colonoscopy however failed outpatient prep and do to symptomatic hypoglycemia with results and by mouth intake due to patient concerns and fear of severe hypoglycemic episode.  Patient would like to see a new provider and has requested referral to a [place].    Patient to see Velora Heckler for screening colonoscopy as well as EGD given duration of GERD symptoms.  She would likely be an appropriate candidate for an inpatient prep do to her prior concerns as outlined above and the family medicine teaching service would be happy to assist in this for diabetes management if it is arranged

## 2014-03-04 NOTE — Assessment & Plan Note (Addendum)
Problem Based Documentation:    Subjective Report:  Persistent midsternal heartburn is worse at night after following spicy meals.  Has been off of PPI a change in insurance and has had worsening of her symptoms.     Assessment & Plan & Follow up Issues:  Chronic condition 1. New Rx for PPI.  2. Needs EGD as long-standing issue and not improving with lifestyle and medication interventions.  Recent intentional weight loss however given the duration of greater than 5 years of PPI therapy with no prior investigation an EGD does seem to be warranted.  Patient would appreciate coordinating with screening colonoscopy

## 2014-03-05 ENCOUNTER — Telehealth: Payer: Self-pay | Admitting: *Deleted

## 2014-03-05 NOTE — Telephone Encounter (Signed)
[facility] Authorization received [facility] for Pantoprazole SOD DR 40 mg tab.  PA form placed in provider box for completion. Called insurance to check formulary, no other medication is listed per [facility] representative.  Derl Barrow, RN

## 2014-03-05 NOTE — Telephone Encounter (Signed)
Per pt, dr Paulla Fore has to call [facility] to authorize it being filled [phone] or [phone]

## 2014-03-05 NOTE — Telephone Encounter (Signed)
Patient was informed that rx were filled.[name], [name]

## 2014-03-06 NOTE — Telephone Encounter (Signed)
Completed, returned to [facility] office

## 2014-03-06 NOTE — Assessment & Plan Note (Signed)
Problem Based Documentation:    Subjective Report:  Previously followed by Dr.[name]  denies tachycardia, hair loss or skin changes     Assessment & Plan & Follow up Issues:   chronic, reported as condition 1. Refill Synthroid  > Patient will need followup TSH, if we are to continue prescribing.

## 2014-03-06 NOTE — Assessment & Plan Note (Addendum)
Problem Based Documentation:    Subjective Report:  Reports her sugars have been improved at home.  Consistently in the high 100s or low 200s.  This is improved.  Pt denies hypoglycemic symptoms/episodes.  No reported polyuria/polydipsia. Pt is compliant with foot exams and denies any new foot lesions or new sensory changes/dysesthesias.  Patient's most recent scheduled colonoscopy was disrupted due to patient's fears over hypoglycemia and inability to remain n.p.o. due to those concerns.      Assessment & Plan & Follow up Issues:  Chronic, poorly controlled condition Has an insulin pump with varying basal dosing and pt reports bolus dosing based on CBGs and occasionally will correct for CARB count Pt afraid of hypoglycemic sx 1. No changes.  Followed by [name].   > Consider adding Invokana

## 2014-03-09 ENCOUNTER — Encounter: Payer: Self-pay | Admitting: Sports Medicine

## 2014-03-09 NOTE — Telephone Encounter (Signed)
PA for Pantoprazole SOD DR 40 mg faxed to [facility] for review.  Derl Barrow, RN

## 2014-03-10 NOTE — Telephone Encounter (Signed)
Pt called. [facility] needs a prior authorization to refill acid reflux pill. Also she hasnt heard about her referral from [facility]. Please advise

## 2014-03-11 ENCOUNTER — Other Ambulatory Visit: Payer: Self-pay | Admitting: Sports Medicine

## 2014-03-13 NOTE — Telephone Encounter (Signed)
Pt called again. She would like a response to her question about her acid reflux medicine

## 2014-03-16 NOTE — Telephone Encounter (Signed)
PA for Pantoprazole approved 02/16/2014 - 03/16/2015.  Case ID # [id number].  [facility] [place] notified.  Derl Barrow, RN

## 2014-03-19 NOTE — Telephone Encounter (Signed)
Her medication has been approved and should be available [place] called and LVM on 3/12.    Please follow up with and see if she has has received her medication and heard from [facility]

## 2014-03-23 ENCOUNTER — Other Ambulatory Visit: Payer: Self-pay | Admitting: Sports Medicine

## 2014-03-24 ENCOUNTER — Telehealth: Payer: Self-pay | Admitting: Internal Medicine

## 2014-04-01 ENCOUNTER — Encounter: Payer: Self-pay | Admitting: Internal Medicine

## 2014-04-01 NOTE — Telephone Encounter (Signed)
Records approved by Dr. Carlean Purl.  Previsit and colon scheduled

## 2014-04-21 ENCOUNTER — Ambulatory Visit (INDEPENDENT_AMBULATORY_CARE_PROVIDER_SITE_OTHER): Payer: BC Managed Care – PPO | Admitting: Sports Medicine

## 2014-04-21 ENCOUNTER — Encounter: Payer: Self-pay | Admitting: Sports Medicine

## 2014-04-21 VITALS — BP 136/59 | HR 70 | Temp 98.2°F | Ht 67.0 in | Wt 260.0 lb

## 2014-04-21 DIAGNOSIS — M76829 Posterior tibial tendinitis, unspecified leg: Secondary | ICD-10-CM

## 2014-04-21 DIAGNOSIS — M76821 Posterior tibial tendinitis, right leg: Secondary | ICD-10-CM

## 2014-04-21 DIAGNOSIS — E109 Type 1 diabetes mellitus without complications: Secondary | ICD-10-CM

## 2014-04-21 DIAGNOSIS — Z299 Encounter for prophylactic measures, unspecified: Secondary | ICD-10-CM

## 2014-04-21 DIAGNOSIS — I10 Essential (primary) hypertension: Secondary | ICD-10-CM

## 2014-04-21 LAB — POCT GLYCOSYLATED HEMOGLOBIN (HGB A1C): HEMOGLOBIN A1C: 9.5

## 2014-04-21 MED ORDER — CANAGLIFLOZIN 100 MG PO TABS: 100.0000 mg | ORAL_TABLET | Freq: Every day | ORAL | Status: DC

## 2014-04-21 NOTE — Patient Instructions (Addendum)
I'm starting Invokana to help with your diabetes, weight gain and BP.  Please follow up with [name] soon to see if we need to make changes to your pump.  Write the ABCs with your ankle at least 1X per day; up to 3Xs per day  Follow up with GI for a screening colonoscopy and diagnosis

## 2014-04-21 NOTE — Progress Notes (Signed)
[name] - 63 y.o. female MRN [id number]  Date of birth: May 09, 1951  CC: Foot Pain, Diabetes and GI Problem   SUBJECTIVE:     HPI Comments: Patient presents with:   Foot Pain   Diabetes - not followed up with Dr Chalmers Cater yet, still having lows ~1X/month in early AM   GI Problem - GERD and due to colonoscopy.  Upcoming appt with [facility]  Patient reports right-sided foot and ankle pain that is worse after walking or standing.  She reports lateral, nonburning, nonradiating pain behind her lateral malleolus.  She has not been doing any specific exercises nor has she been taking any specific medications.  She denies any falls due to this but is hesitant on steps.  Her diabetes continues to be a problem and her sugars have been running high and she continues to have lows early morning.  She is not follow up with [name] yet.  She continues to be very concerned regarding trying to fast and perform her colonoscopy prep without strict monitoring of her diabetes.    Pt denies chest pain, dyspnea at rest or exertion, PND, lower extremity edema. Patient denies any facial asymmetry, unilateral weakness, or dysarthria.  No dysuria, no reported frequent vaginal infections or urinary tract infections See problem based charting for additional problem specific subjective (including HPI, Interval History & ROS)   HISTORY: Health Maintenance Due  Topic  . Colonoscopy   . Mammogram     Recent Labs  08/20/13 1353 12/24/13 1431 04/21/14 0855  HGBA1C 9.2 9.4 9.5  } History  Smoking status  . Never Smoker   Smokeless tobacco  . Not on file  Otherwise past Medical, Surgical, Social, and Family History Reviewed per EMR Medications and Allergies reviewed and updated per below.  OBJECTIVE: VITALS: BP: 136/59 mmHg  HR: 70 bpm  TEMP: 98.2 F (36.8 C) (Oral)  RESP:    HT: 5\' 7"  (170.2 cm)  WT: 260 lb (117.935 kg)  BMI: 40.8   BP Readings from Last 3 Encounters:  04/21/14 136/59  03/04/14  129/52  12/24/13 119/72   Wt Readings from Last 3 Encounters:  04/21/14 260 lb (117.935 kg)  03/04/14 256 lb (116.121 kg)  12/24/13 268 lb (121.564 kg)     Physical Exam  Vitals reviewed. Constitutional: She is well-developed, well-nourished, and in no distress.  HENT:  Head: Normocephalic and atraumatic.  Right Ear: External ear normal.  Left Ear: External ear normal.  Neck: Normal range of motion. Neck supple. No JVD present. No tracheal deviation present.  Cardiovascular: Normal rate, regular rhythm and normal heart sounds.  Exam reveals no gallop and no friction rub.   No murmur heard. Pulmonary/Chest: Effort normal and breath sounds normal. No respiratory distress. She has no wheezes. She has no rales.  Abdominal: Soft.  Musculoskeletal: She exhibits no edema.       Right ankle: She exhibits normal range of motion, no swelling and no ecchymosis. Tenderness (posterior tibialis tendon). Lateral malleolus tenderness found. Achilles tendon exhibits no pain, no defect and normal Thompson's test results.  Negative ankle drawer test, negative Kleiger  Neurological: She is alert.  Moves all 4 extremities spontaneously; no lateralization.  Skin: Skin is warm and dry. She is not diaphoretic.  Psychiatric: Mood, memory, affect and judgment normal.   MEDICATIONS, LABS & OTHER ORDERS: Previous Medications   ACETAMINOPHEN (TYLENOL) 500 MG TABLET    Take 1,000 mg by mouth every 6 (six) hours as needed. For pain  ASPIRIN 81 MG EC TABLET    TAKE 1 TABLET BY MOUTH EVERY DAY   ATORVASTATIN (LIPITOR) 20 MG TABLET    Take 1 tablet (20 mg total) by mouth daily.   CALCIUM PO    Take 1 tablet by mouth daily. OTC   FUROSEMIDE (LASIX) 40 MG TABLET    TAKE 1 TABLET BY MOUTH EVERY DAY   IBUPROFEN (ADVIL,MOTRIN) 200 MG TABLET    Take 400 mg by mouth every 6 (six) hours as needed. For pain   INSULIN HUMAN (INSULIN PUMP) 100 UNIT/ML SOLN    Inject 1 each into the skin continuous. Uses novolog insulin in  her pump.   ISOSORBIDE MONONITRATE (IMDUR) 30 MG 24 HR TABLET    TAKE 1 TABLET BY MOUTH EVERY DAY   LEVOTHYROXINE (SYNTHROID, LEVOTHROID) 150 MCG TABLET    Take 1 tablet (150 mcg total) by mouth daily before breakfast.   LOSARTAN-HYDROCHLOROTHIAZIDE (HYZAAR) 50-12.5 MG PER TABLET    TAKE 1 TABLET BY MOUTH EVERY DAY   METOPROLOL SUCCINATE (TOPROL-XL) 50 MG 24 HR TABLET    Take 1 tablet (50 mg total) by mouth daily.   PANTOPRAZOLE (PROTONIX) 40 MG TABLET    Take 1 tablet (40 mg total) by mouth daily.   PEG 3350-KCL-NABCB-NACL-NASULF (PEG-3350/ELECTROLYTES) 236 G SOLR       POTASSIUM PO    Take 2 tablets by mouth daily. OTC   Modified Medications   No medications on file   New Prescriptions   CANAGLIFLOZIN (INVOKANA) 100 MG TABS    Take 1 tablet (100 mg total) by mouth daily.   Discontinued Medications   No medications on file   Orders Placed This Encounter  Procedures  . POCT HgB A1C   ASSESSMENT & PLAN: See problem based charting & AVS for pt instructions.

## 2014-04-26 DIAGNOSIS — M76821 Posterior tibial tendinitis, right leg: Secondary | ICD-10-CM | POA: Insufficient documentation

## 2014-04-26 NOTE — Assessment & Plan Note (Signed)
Patient is due for colonoscopy.  She has previously been scheduled but was unable to complete outpatient prep for a screening colonoscopy due to difficulties  maintaining appropriate glycemic control during this process.  Patient requests an I recommended that if she needs a bowel prep is performed in an inpatient setting to do to her labile sugars likely due to [facility] effect especially while fasting pre-op.

## 2014-04-26 NOTE — Assessment & Plan Note (Signed)
Chronic condition 1. Adding Invokana will hopefully help with control. > Consider reducing or removing Lasix from her regimen given diuretic effect accomplished by Invokana  .

## 2014-04-26 NOTE — Assessment & Plan Note (Addendum)
chronic, poorly controlled condition.  Co-managed by [name]; on an insulin pump - Continues to have difficulties maintaining her sugars in a normal range, both highs and lows 1. Start Invokana low dose initially to ensure not worsening hypoglycemic episodes.  I have discussed potential for yeast vaginitis and urinary tract infections the patient is willing to try this to help with her blood pressure, weight loss and glycemic control. 2. I encouraged her to follow up with [name] as soon as possible to further discuss insulin pump management > Followup A1c in 3 months  .

## 2014-04-26 NOTE — Assessment & Plan Note (Signed)
Acute, recurrent condition 1. Given HEP including ankle ABCs > f/u if not improved with conservative therapy.  Consider imaging  .

## 2014-04-28 ENCOUNTER — Encounter: Payer: Self-pay | Admitting: Sports Medicine

## 2014-05-13 ENCOUNTER — Telehealth: Payer: Self-pay | Admitting: Internal Medicine

## 2014-05-13 NOTE — Telephone Encounter (Signed)
Patient called back and [name], [facility] helped patient schedule office visit.

## 2014-05-13 NOTE — Telephone Encounter (Signed)
OK 

## 2014-05-13 NOTE — Telephone Encounter (Signed)
I spoke with Dr. Paulla Fore.  He feels that the patient should not be a direct procedure. She is a Type 1 diabetic on an insulin pump with very labile glucose levels.   She has had significant hypoglycemia with colon prep in the past.  He feels she would benefit from an inpatient prep.  He states he is happy to consult in the hospital if that is necessary.  I will cancel upcoming colon and pre-visit and schedule her for an office visit to see Dr. Carlean Purl prior to setting up colonoscopy.    I have left a message for the patient to call back to discuss

## 2014-06-02 ENCOUNTER — Encounter: Payer: BC Managed Care – PPO | Admitting: Internal Medicine

## 2014-06-11 ENCOUNTER — Other Ambulatory Visit: Payer: Self-pay | Admitting: *Deleted

## 2014-06-12 MED ORDER — ISOSORBIDE MONONITRATE ER 30 MG PO TB24: 30.0000 mg | ORAL_TABLET | Freq: Every day | ORAL | Status: DC

## 2014-06-12 MED ORDER — LOSARTAN POTASSIUM-HCTZ 50-12.5 MG PO TABS: ORAL_TABLET | ORAL | Status: DC

## 2014-06-12 MED ORDER — ATORVASTATIN CALCIUM 20 MG PO TABS: 20.0000 mg | ORAL_TABLET | Freq: Every day | ORAL | Status: DC

## 2014-06-15 ENCOUNTER — Other Ambulatory Visit: Payer: Self-pay | Admitting: *Deleted

## 2014-06-15 MED ORDER — LOSARTAN POTASSIUM-HCTZ 50-12.5 MG PO TABS: ORAL_TABLET | ORAL | Status: DC

## 2014-06-16 ENCOUNTER — Encounter: Payer: Self-pay | Admitting: Sports Medicine

## 2014-06-16 ENCOUNTER — Other Ambulatory Visit: Payer: Self-pay | Admitting: *Deleted

## 2014-06-16 MED ORDER — LEVOTHYROXINE SODIUM 150 MCG PO TABS: 150.0000 ug | ORAL_TABLET | Freq: Every day | ORAL | Status: DC

## 2014-06-16 MED ORDER — PANTOPRAZOLE SODIUM 40 MG PO TBEC: 40.0000 mg | DELAYED_RELEASE_TABLET | Freq: Every day | ORAL | Status: DC

## 2014-06-17 ENCOUNTER — Telehealth: Payer: Self-pay | Admitting: Sports Medicine

## 2014-06-17 NOTE — Telephone Encounter (Signed)
Is changing to [facility]. Could he write the prescriptions for 90 day: lovastation isopride ? Generic for lipitor Her synthroid -generic Acid refux pill Test strips Please advise

## 2014-06-19 MED ORDER — ISOSORBIDE MONONITRATE ER 30 MG PO TB24: 30.0000 mg | ORAL_TABLET | Freq: Every day | ORAL | Status: DC

## 2014-06-19 MED ORDER — FUROSEMIDE 40 MG PO TABS: 40.0000 mg | ORAL_TABLET | Freq: Every day | ORAL | Status: DC

## 2014-06-19 MED ORDER — LOSARTAN POTASSIUM-HCTZ 50-12.5 MG PO TABS
ORAL_TABLET | ORAL | Status: DC
Start: 2014-06-19 — End: 2014-07-04

## 2014-06-19 MED ORDER — LEVOTHYROXINE SODIUM 150 MCG PO TABS: 150.0000 ug | ORAL_TABLET | Freq: Every day | ORAL | Status: DC

## 2014-06-19 MED ORDER — PANTOPRAZOLE SODIUM 40 MG PO TBEC: 40.0000 mg | DELAYED_RELEASE_TABLET | Freq: Every day | ORAL | Status: DC

## 2014-06-19 MED ORDER — METOPROLOL SUCCINATE ER 50 MG PO TB24: 50.0000 mg | ORAL_TABLET | Freq: Every day | ORAL | Status: DC

## 2014-06-19 MED ORDER — ATORVASTATIN CALCIUM 20 MG PO TABS: 20.0000 mg | ORAL_TABLET | Freq: Every day | ORAL | Status: DC

## 2014-06-19 NOTE — Telephone Encounter (Signed)
[facility]. I'm not sure what the metoprolol question she had was.  She should also not be on lovastatin and Lipitor.  She should only be on the Lipitor.  Also I have not been prescribing her testing strips these historically come from her endocrinologist.  I am not sure what type of meter she has cannot complete this without the information.  Please call the patient and informed  Meds ordered this encounter  Medications  . atorvastatin (LIPITOR) 20 MG tablet    Sig: Take 1 tablet (20 mg total) by mouth daily.    Dispense:  90 tablet    Refill:  1  . levothyroxine (SYNTHROID, LEVOTHROID) 150 MCG tablet    Sig: Take 1 tablet (150 mcg total) by mouth daily before breakfast.    Dispense:  90 tablet    Refill:  1  . isosorbide mononitrate (IMDUR) 30 MG 24 hr tablet    Sig: Take 1 tablet (30 mg total) by mouth daily.    Dispense:  90 tablet    Refill:  3  . furosemide (LASIX) 40 MG tablet    Sig: Take 1 tablet (40 mg total) by mouth daily.    Dispense:  90 tablet    Refill:  3  . losartan-hydrochlorothiazide (HYZAAR) 50-12.5 MG per tablet    Sig: TAKE 1 TABLET BY MOUTH EVERY DAY    Dispense:  90 tablet    Refill:  3  . metoprolol succinate (TOPROL-XL) 50 MG 24 hr tablet    Sig: Take 1 tablet (50 mg total) by mouth daily.    Dispense:  90 tablet    Refill:  3  . pantoprazole (PROTONIX) 40 MG tablet    Sig: Take 1 tablet (40 mg total) by mouth daily.    Dispense:  90 tablet    Refill:  3

## 2014-06-19 NOTE — Telephone Encounter (Signed)
Pt called and wanted to make sure that Dr. Paulla Fore sends in her medication to her new pharmacy [facility] before he leaves. jw

## 2014-07-04 ENCOUNTER — Emergency Department (HOSPITAL_COMMUNITY)
Admission: EM | Admit: 2014-07-04 | Discharge: 2014-07-04 | Disposition: A | Discharge status: Home / Self Care | Payer: BC Managed Care – PPO | Attending: Emergency Medicine | Admitting: Emergency Medicine

## 2014-07-04 ENCOUNTER — Encounter (HOSPITAL_COMMUNITY): Payer: Self-pay | Admitting: Emergency Medicine

## 2014-07-04 DIAGNOSIS — Z79899 Other long term (current) drug therapy: Secondary | ICD-10-CM | POA: Insufficient documentation

## 2014-07-04 DIAGNOSIS — E109 Type 1 diabetes mellitus without complications: Secondary | ICD-10-CM | POA: Insufficient documentation

## 2014-07-04 DIAGNOSIS — Z7982 Long term (current) use of aspirin: Secondary | ICD-10-CM | POA: Insufficient documentation

## 2014-07-04 DIAGNOSIS — I10 Essential (primary) hypertension: Secondary | ICD-10-CM | POA: Insufficient documentation

## 2014-07-04 DIAGNOSIS — E039 Hypothyroidism, unspecified: Secondary | ICD-10-CM | POA: Insufficient documentation

## 2014-07-04 DIAGNOSIS — E785 Hyperlipidemia, unspecified: Secondary | ICD-10-CM | POA: Insufficient documentation

## 2014-07-04 DIAGNOSIS — Z85850 Personal history of malignant neoplasm of thyroid: Secondary | ICD-10-CM | POA: Insufficient documentation

## 2014-07-04 DIAGNOSIS — N390 Urinary tract infection, site not specified: Secondary | ICD-10-CM | POA: Insufficient documentation

## 2014-07-04 DIAGNOSIS — K219 Gastro-esophageal reflux disease without esophagitis: Secondary | ICD-10-CM | POA: Insufficient documentation

## 2014-07-04 LAB — URINE MICROSCOPIC-ADD ON

## 2014-07-04 LAB — URINALYSIS, ROUTINE W REFLEX MICROSCOPIC
BILIRUBIN URINE: NEGATIVE
Glucose, UA: >1000 mg/dL — AB
Ketones, ur: NEGATIVE mg/dL
NITRITE: NEGATIVE
Specific Gravity, Urine: 1.036 — ABNORMAL HIGH (ref 1.005–1.030)
Urobilinogen, UA: 1 mg/dL (ref 0.0–1.0)
pH: 6 (ref 5.0–8.0)

## 2014-07-04 MED ORDER — CEPHALEXIN 500 MG PO CAPS: 500.0000 mg | ORAL_CAPSULE | Freq: Three times a day (TID) | ORAL | Status: DC

## 2014-07-04 NOTE — ED Provider Notes (Signed)
CSN: [id number]     Arrival date & time 07/04/14  0449 History   First MD Initiated Contact with Patient 07/04/14 [phone]     Chief Complaint  Patient presents with  . Urinary Frequency     (Consider location/radiation/quality/duration/timing/severity/associated sxs/prior Treatment) HPI Comments: The patient is a 63 year old female past history of diabetes, GERD, hypertension presents emergency room chief complaint of dysuria for 2 days. The patient reports burning with urination, urinary frequency, urgency. Denies fever, chills. Home glucose approximately 116. Last UTI January.  The patient states she had a prescription for a re pill for Diflucan at her pharmacy and took that do to urinary symptoms 1 day ago, denies relief of symptoms with medicaiton. Denies vaginal discharge. PCP: Janora Norlander, DO  Patient is a 63 y.o. female presenting with frequency. The history is provided by the patient. No language interpreter was used.  Urinary Frequency This is a new problem. The current episode started in the past 7 days. Pertinent negatives include no abdominal pain, chills, fever, nausea or vomiting.    Past Medical History  Diagnosis Date  . Thyroid cancer     per pt S/p Total Thyroidectomy with Radioactive Iodine Therapy  . Hyperlipidemia   . Hypothyroidism   . Hypertension   . GERD (gastroesophageal reflux disease)   . Seasonal allergies   . Type 1 diabetes mellitus    Past Surgical History  Procedure Laterality Date  . Thyroidectomy  2006  . Abdominal hysterectomy  1995   Family History  Problem Relation Age of Onset  . Heart disease Mother   . Hyperlipidemia Mother   . Hypertension Mother   . Early death Father     Head Injury at Work  . Hyperlipidemia Father   . Hypertension Father   . Diabetes Maternal Aunt    History  Substance Use Topics  . Smoking status: Never Smoker   . Smokeless tobacco: Not on file  . Alcohol Use: No   OB History   Grav Para Term  Preterm Abortions TAB SAB Ect Mult Living                 Review of Systems  Constitutional: Negative for fever and chills.  Gastrointestinal: Negative for nausea, vomiting and abdominal pain.  Genitourinary: Positive for dysuria, urgency and frequency. Negative for hematuria and flank pain.      Allergies  Review of patient's allergies indicates no known allergies.  Home Medications   Prior to Admission medications   Medication Sig Start Date End Date Taking? Authorizing Provider  acetaminophen (TYLENOL) 500 MG tablet Take 1,000 mg by mouth every 6 (six) hours as needed. For pain   Yes Historical Provider, MD  aspirin EC 81 MG tablet Take 81 mg by mouth daily.   Yes Historical Provider, MD  atorvastatin (LIPITOR) 20 MG tablet Take 1 tablet (20 mg total) by mouth daily. 06/19/14  Yes Gerda Diss, DO  CALCIUM PO Take 1 tablet by mouth daily as needed (bone health). OTC   Yes Historical Provider, MD  Canagliflozin (INVOKANA) 100 MG TABS Take 1 tablet (100 mg total) by mouth daily. 04/21/14  Yes Gerda Diss, DO  Insulin Human (INSULIN PUMP) 100 unit/ml SOLN Inject 1 each into the skin continuous. Uses novolog insulin in her pump.   Yes Historical Provider, MD  isosorbide mononitrate (IMDUR) 30 MG 24 hr tablet Take 1 tablet (30 mg total) by mouth daily. 06/19/14  Yes Gerda Diss, DO  levothyroxine (  SYNTHROID, LEVOTHROID) 150 MCG tablet Take 1 tablet (150 mcg total) by mouth daily before breakfast. 06/19/14  Yes Gerda Diss, DO  losartan-hydrochlorothiazide (HYZAAR) 50-12.5 MG per tablet Take 1 tablet by mouth daily.   Yes Historical Provider, MD  metoprolol succinate (TOPROL-XL) 50 MG 24 hr tablet Take 1 tablet (50 mg total) by mouth daily. 06/19/14  Yes Gerda Diss, DO  pantoprazole (PROTONIX) 40 MG tablet Take 1 tablet (40 mg total) by mouth daily. 06/19/14  Yes Gerda Diss, DO  POTASSIUM PO Take 2 tablets by mouth daily as needed (when feeling like its needed). OTC    Yes Historical Provider, MD   BP 132/60  Pulse 73  Temp(Src) 98.4 F (36.9 C) (Oral)  Resp 12  Ht 5\' 7"  (1.702 m)  Wt 248 lb 8 oz (112.719 kg)  BMI 38.91 kg/m2  SpO2 100% Physical Exam  Nursing note and vitals reviewed. Constitutional: She is oriented to person, place, and time. She appears well-developed and well-nourished.  Non-toxic appearance. She does not have a sickly appearance. She does not appear ill. No distress.  HENT:  Head: Normocephalic and atraumatic.  Eyes: Conjunctivae are normal. Right eye exhibits no discharge. Left eye exhibits no discharge.  Neck: Normal range of motion. Neck supple.  Cardiovascular: Normal rate and regular rhythm.   Pulmonary/Chest: Effort normal. No respiratory distress. She has no wheezes. She has no rales.  Abdominal: Soft. Normal appearance. There is no tenderness. There is no rigidity, no rebound, no guarding, no CVA tenderness, no tenderness at McBurney's point and negative Murphy's sign.  Obese abdomen. Insulin pump in place.  Neurological: She is alert and oriented to person, place, and time.  Skin: Skin is warm and dry. She is not diaphoretic.  Psychiatric: She has a normal mood and affect. Her behavior is normal.    ED Course  Procedures (including critical care time) Labs Review Results for orders placed during the hospital encounter of 07/04/14  URINALYSIS, ROUTINE W REFLEX MICROSCOPIC      Result Value Ref Range   Color, Urine YELLOW  YELLOW   APPearance TURBID (*) CLEAR   Specific Gravity, Urine 1.036 (*) 1.005 - 1.030   pH 6.0  5.0 - 8.0   Glucose, UA >1000 (*) NEGATIVE mg/dL   Hgb urine dipstick LARGE (*) NEGATIVE   Bilirubin Urine NEGATIVE  NEGATIVE   Ketones, ur NEGATIVE  NEGATIVE mg/dL   Protein, ur >300 (*) NEGATIVE mg/dL   Urobilinogen, UA 1.0  0.0 - 1.0 mg/dL   Nitrite NEGATIVE  NEGATIVE   Leukocytes, UA MODERATE (*) NEGATIVE  URINE MICROSCOPIC-ADD ON      Result Value Ref Range   Squamous Epithelial / LPF  RARE  RARE   WBC, UA TOO NUMEROUS TO COUNT  <3 WBC/hpf   RBC / HPF 7-10  <3 RBC/hpf   Bacteria, UA FEW (*) RARE    MDM   Final diagnoses:  UTI (lower urinary tract infection)   Patient reports urinary symptoms for several days. Patient afebrile, no CVA tenderness no abdominal tenderness to palpation. UA shows infection plan to treat as an outpatient with Keflex and followup with PCP.  Meds given in ED:  Medications - No data to display  Discharge Medication List as of 07/04/2014  6:38 AM    START taking these medications   Details  cephALEXin (KEFLEX) 500 MG capsule Take 1 capsule (500 mg total) by mouth 3 (three) times daily., Starting 07/04/2014, Until Discontinued, Print  Lorrine Kin, PA-C 07/04/14 [phone]

## 2014-07-04 NOTE — Discharge Instructions (Signed)
Call for a follow up appointment with a Family or Primary Care Provider.  °Return if Symptoms worsen.   °Take medication as prescribed.  °Drink plenty of fluids. °

## 2014-07-04 NOTE — ED Notes (Signed)
Pt very adamant about taking her morning medications. Protonix, aspirin, invokana,  Metoprolol, isosorbide, losartan. Dr. Sharol Given informed and states it is okay for this patient to take these medications.

## 2014-07-04 NOTE — ED Provider Notes (Signed)
Medical screening examination/treatment/procedure(s) were performed by non-physician practitioner and as supervising physician I was immediately available for consultation/collaboration.   EKG Interpretation None       Kalman Drape, MD 07/04/14 1801

## 2014-07-04 NOTE — ED Notes (Addendum)
Uti symptoms for x 2 days; painful urination. Taking ot

## 2014-07-04 NOTE — ED Notes (Signed)
Pt A&OX4, ambulatory at d/c with steady gait, NAD 

## 2014-07-07 LAB — URINE CULTURE

## 2014-07-09 ENCOUNTER — Telehealth (HOSPITAL_BASED_OUTPATIENT_CLINIC_OR_DEPARTMENT_OTHER): Payer: Self-pay | Admitting: Emergency Medicine

## 2014-07-09 ENCOUNTER — Encounter: Payer: Self-pay | Admitting: Gastroenterology

## 2014-07-09 ENCOUNTER — Ambulatory Visit (INDEPENDENT_AMBULATORY_CARE_PROVIDER_SITE_OTHER): Payer: BC Managed Care – PPO | Admitting: Family Medicine

## 2014-07-09 VITALS — BP 89/66 | HR 77 | Temp 98.2°F | Ht 67.0 in | Wt 252.0 lb

## 2014-07-09 DIAGNOSIS — M79609 Pain in unspecified limb: Secondary | ICD-10-CM

## 2014-07-09 DIAGNOSIS — I10 Essential (primary) hypertension: Secondary | ICD-10-CM

## 2014-07-09 DIAGNOSIS — M79676 Pain in unspecified toe(s): Secondary | ICD-10-CM

## 2014-07-09 MED ORDER — METOPROLOL TARTRATE 25 MG PO TABS: 50.0000 mg | ORAL_TABLET | Freq: Two times a day (BID) | ORAL | Status: DC

## 2014-07-09 MED ORDER — MELOXICAM 7.5 MG PO TABS: 7.5000 mg | ORAL_TABLET | Freq: Every day | ORAL | Status: DC

## 2014-07-09 NOTE — Assessment & Plan Note (Signed)
Changed patient's metoprolol to metoprolol tartrate 25 mg BID (same dose just different formulation).

## 2014-07-09 NOTE — Progress Notes (Signed)
   Subjective:    Patient ID: [name], female    DOB: 1951/05/14, 63 y.o.   MRN: [id number]  HPI 63 year old female with HTN, HLD, & DM Type 1 presents to the clinic today for a same day appointment with complaints of bilateral great toe pain.  Patient would also like to discuss a medication changed today.  1) Bilateral great toe pain - Patient reports that this started this am. - Pain is located primarily at the IP joint of the great toes.  Pain is moderate in severity. - Patient denies any recent trauma/fall/injury. - No reported swelling, redness.  No open wounds/lesions. - Patient reports that she has good sensation in her feet and has someone check her feet regularly.  2) Medication change - Patient requesting change from Metoprolol succinate to tartrate due to cost. Review of Systems Per HPI    Objective:   Physical Exam Filed Vitals:   07/09/14 1438  BP: 89/66  Pulse: 77  Temp: 98.2 F (36.8 C)   Exam: General: well appearing elderly female in NAD. Extremities: No open lesions/wounds noted.  Great toes - No erythema or swelling appreciated. Tender to palpation of the IP joints.  2+ Dorsalis pedis and posterior tibial pulses.    Assessment & Plan:  See Problem List

## 2014-07-09 NOTE — Patient Instructions (Signed)
It was nice to see you today.  I have switched your Metoprolol to the cheaper version.  Regarding your toe pain, this is likely secondary to arthritis.  Currently, I do not see any evidence of infection or gout.  Follow up with your PCP in ~ 1 month or sooner if needed.

## 2014-07-09 NOTE — Assessment & Plan Note (Addendum)
No lesions/wound suggestive of underlying diabetic foot infection. No redness/erythema or severe tenderness suggestive of gout. Pain is likely secondary to underlying OA of the IP joints.  This could be evidence of RA but patient is not having other significant joint involvement and pain just started today.  Tylenol PRN as needed.  Patient reassured regarding lack of infection/gout.

## 2014-07-09 NOTE — Telephone Encounter (Signed)
Post ED Visit - Positive Culture Follow-up  Culture report reviewed by antimicrobial stewardship pharmacist: []  Wes Dulaney, Pharm.D., BCPS []  Heide Guile, Pharm.D., BCPS []  Alycia Rossetti, Pharm.D., BCPS [x]  [place], Pharm.D., BCPS, AAHIVP []  Legrand Como, Pharm.D., BCPS, AAHIVP  Positive urine culture Treated with Keflex, organism sensitive to the same and no further patient follow-up is required at this time.  Myrna Blazer 07/09/2014, 5:35 PM

## 2014-07-10 ENCOUNTER — Telehealth: Payer: Self-pay | Admitting: Family Medicine

## 2014-07-10 ENCOUNTER — Other Ambulatory Visit: Payer: Self-pay | Admitting: *Deleted

## 2014-07-10 MED ORDER — ASPIRIN EC 81 MG PO TBEC: 81.0000 mg | DELAYED_RELEASE_TABLET | Freq: Every day | ORAL | Status: DC

## 2014-07-10 NOTE — Telephone Encounter (Signed)
Patient doing well.  States that she forgot to ask for asprin at her visit yesterday.  This is a longstanding medication.  Called in 90 w/4RFs.  Patient informed me use of Invokana was going great.  BG's are doing well.  No complaints.  Plan to f/u in the next 3 months.

## 2014-07-20 ENCOUNTER — Ambulatory Visit (INDEPENDENT_AMBULATORY_CARE_PROVIDER_SITE_OTHER): Payer: BC Managed Care – PPO | Admitting: Internal Medicine

## 2014-07-20 ENCOUNTER — Encounter: Payer: Self-pay | Admitting: Internal Medicine

## 2014-07-20 VITALS — BP 118/60 | HR 80 | Ht 65.25 in | Wt 250.0 lb

## 2014-07-20 DIAGNOSIS — E1069 Type 1 diabetes mellitus with other specified complication: Secondary | ICD-10-CM

## 2014-07-20 DIAGNOSIS — Z1211 Encounter for screening for malignant neoplasm of colon: Secondary | ICD-10-CM

## 2014-07-20 DIAGNOSIS — E10649 Type 1 diabetes mellitus with hypoglycemia without coma: Secondary | ICD-10-CM

## 2014-07-20 NOTE — Progress Notes (Signed)
Subjective:    Patient ID: [name], female    DOB: [birth year], 63 y.o.   MRN: [id number]  HPI Patient is a very nice 63 year old African American woman who is due for a screening colonoscopy. She has type 1 diabetes mellitus and is on insulin pump. She has been having problems with low sugars and when she actually prepped for a colonoscopy recently with another physician, she had a drink some spite in the middle of night to raise her sugar and her colonoscopy was then subsequently canceled. Her primary care physician is thought that she needs to be admitted to the hospital for observation status and care to prevent hypoglycemia during the prep. She does not have any active GI complaints at this time. Wife is currently somewhat stressful her at this time, her son has been ill, and his wife has bipolar disorder so the patient is helping to care for her 46-year-old granddaughter who has attention deficit disorder and opposition of the find disorder. No Known Allergies Outpatient Prescriptions Prior to Visit  Medication Sig Dispense Refill  . acetaminophen (TYLENOL) 500 MG tablet Take 1,000 mg by mouth every 6 (six) hours as needed. For pain      . aspirin EC 81 MG tablet Take 1 tablet (81 mg total) by mouth daily.  90 tablet  4  . atorvastatin (LIPITOR) 20 MG tablet Take 1 tablet (20 mg total) by mouth daily.  90 tablet  1  . CALCIUM PO Take 1 tablet by mouth daily as needed (bone health). OTC      . Canagliflozin (INVOKANA) 100 MG TABS Take 1 tablet (100 mg total) by mouth daily.  30 tablet  5  . Insulin Human (INSULIN PUMP) 100 unit/ml SOLN Inject 1 each into the skin continuous. Uses novolog insulin in her pump.      . isosorbide mononitrate (IMDUR) 30 MG 24 hr tablet Take 1 tablet (30 mg total) by mouth daily.  90 tablet  3  . levothyroxine (SYNTHROID, LEVOTHROID) 150 MCG tablet Take 1 tablet (150 mcg total) by mouth daily before breakfast.  90 tablet  1  . losartan-hydrochlorothiazide  (HYZAAR) 50-12.5 MG per tablet Take 1 tablet by mouth daily.      . meloxicam (MOBIC) 7.5 MG tablet Take 1 tablet (7.5 mg total) by mouth daily.  30 tablet  1  . metoprolol tartrate (LOPRESSOR) 25 MG tablet Take 2 tablets (50 mg total) by mouth 2 (two) times daily.  180 tablet  3  . pantoprazole (PROTONIX) 40 MG tablet Take 1 tablet (40 mg total) by mouth daily.  90 tablet  3  . POTASSIUM PO Take 2 tablets by mouth daily as needed (when feeling like its needed). OTC       No facility-administered medications prior to visit.   Past Medical History  Diagnosis Date  . Thyroid cancer     per pt S/p Total Thyroidectomy with Radioactive Iodine Therapy  . Hyperlipidemia   . Hypothyroidism   . Hypertension   . GERD (gastroesophageal reflux disease)   . Seasonal allergies   . Type 1 diabetes mellitus   . Hemorrhoids   . Depression   . Anemia   . Arthritis   . CHF (congestive heart failure)    Past Surgical History  Procedure Laterality Date  . Thyroidectomy  2006  . Abdominal hysterectomy  1995    partial  . Cardiac catheterization      with coronary angiogram  . Ankle fracture  surgery Right    History   Social History  . Marital Status: Divorced    Spouse Name: N/A    Number of Children: 1  . Years of Education: N/A   Occupational History  . retired Pharmacist, hospital    Social History Main Topics  . Smoking status: Never Smoker   . Smokeless tobacco: Never Used  . Alcohol Use: No  . Drug Use: No  . Sexual Activity: None   Other Topics Concern  . 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 One son, helping to care for her granddaughter. No caffeine. Updated 07/20/2014.   Family History  Problem Relation Age of Onset  . Heart disease Mother     MI  . Hyperlipidemia Mother   . Hypertension Mother   . Early death Father     Head Injury at Work  . Hyperlipidemia Father   . Hypertension Father   . Diabetes Maternal Aunt   . Prostate  cancer Paternal Uncle   . Heart disease Maternal Aunt   . Renal Disease Mother     insufficiency       Review of Systems As per history of present illness, all other review of systems appears negative.    Objective:   Physical Exam General:  NAD Eyes:   anicteric Lungs:  clear Heart:  S1S2 no rubs, murmurs or gallops Abdomen:  soft and nontender, BS+ Ext:   no edema    Data Reviewed:  Previous colonoscopy, negative for colorectal neoplasia        Assessment & Plan:   1. Special screening for malignant neoplasms, colon   2. Type 1 diabetes mellitus with hypoglycemia and without coma    I think it seems appropriate to have her be observation admit with in-hospital prep. Consult family medicine when she is in the hospital. Probably just stop her insulin pump overnight while she is prepping, but will check.  The risks and benefits as well as alternatives of endoscopic procedure(s) have been discussed and reviewed. All questions answered. The patient agrees to proceed.  I appreciate the opportunity to care for this patient. CC: Janora Norlander, DO

## 2014-07-20 NOTE — Patient Instructions (Signed)
Today you have been set up for a colonoscopy at [facility] for September 16th.  You will be admitted September 15th to be prepped for the colonoscopy.    You may have a light breakfast the morning of prep day (the day before the procedure). You may choose from: eggs, toast, chicken noodle soup, crackers.  You should have your breakfast completed between 8:00 and 9:00 am.  Clear liquids only for the rest of the day on prep day and up until 4 hours before procedure.  I appreciate the opportunity to care for you.

## 2014-07-23 ENCOUNTER — Encounter: Payer: Self-pay | Admitting: Family Medicine

## 2014-07-23 ENCOUNTER — Encounter: Payer: Self-pay | Admitting: Internal Medicine

## 2014-07-31 ENCOUNTER — Encounter: Payer: Self-pay | Admitting: Family Medicine

## 2014-08-04 NOTE — Telephone Encounter (Signed)
Patient informed.[name], [name]

## 2014-08-04 NOTE — Telephone Encounter (Signed)
Message copied by Corinna Capra on Tue Aug 04, 2014  4:58 PM ------      Message from: Janora Norlander      Created: Mon Aug 03, 2014  1:40 PM       Patient does not need to see me prior to colonoscopy.  I look forward to meeting her at a future date! ------

## 2014-08-04 NOTE — Telephone Encounter (Signed)
Patient informed voiced appreciation.[name], [name]

## 2014-08-04 NOTE — Telephone Encounter (Signed)
Message copied by Corinna Capra on Tue Aug 04, 2014  4:59 PM ------      Message from: Janora Norlander      Created: Mon Aug 03, 2014  1:40 PM       Patient does not need to see me prior to colonoscopy.  I look forward to meeting her at a future date! ------

## 2014-08-06 ENCOUNTER — Encounter: Payer: Self-pay | Admitting: Internal Medicine

## 2014-08-26 ENCOUNTER — Telehealth: Payer: Self-pay | Admitting: Internal Medicine

## 2014-08-26 ENCOUNTER — Encounter (HOSPITAL_COMMUNITY): Payer: Self-pay | Admitting: Pharmacy Technician

## 2014-08-26 NOTE — Telephone Encounter (Signed)
All questions answered.  She is advised that she will be contacted by the hospital with her room on 9/15.  She will call back for any additional questions or concerns

## 2014-09-07 ENCOUNTER — Telehealth: Payer: Self-pay | Admitting: Internal Medicine

## 2014-09-07 NOTE — Telephone Encounter (Signed)
Patient notified that she will be notified by the hospital when the bed is available.  Unfortunately we don't control when they will have a bed, I explained,.

## 2014-09-08 ENCOUNTER — Encounter (HOSPITAL_COMMUNITY): Payer: Self-pay | Admitting: Physician Assistant

## 2014-09-08 ENCOUNTER — Observation Stay (HOSPITAL_COMMUNITY)
Admission: RE | Admit: 2014-09-08 | Discharge: 2014-09-09 | Disposition: A | Discharge status: Home / Self Care | Payer: BC Managed Care – PPO | Source: Ambulatory Visit | Attending: Internal Medicine | Admitting: Internal Medicine

## 2014-09-08 DIAGNOSIS — F329 Major depressive disorder, single episode, unspecified: Secondary | ICD-10-CM | POA: Insufficient documentation

## 2014-09-08 DIAGNOSIS — Z1211 Encounter for screening for malignant neoplasm of colon: Principal | ICD-10-CM

## 2014-09-08 DIAGNOSIS — E119 Type 2 diabetes mellitus without complications: Secondary | ICD-10-CM | POA: Diagnosis present

## 2014-09-08 DIAGNOSIS — E039 Hypothyroidism, unspecified: Secondary | ICD-10-CM | POA: Insufficient documentation

## 2014-09-08 DIAGNOSIS — Z9641 Presence of insulin pump (external) (internal): Secondary | ICD-10-CM | POA: Insufficient documentation

## 2014-09-08 DIAGNOSIS — E1059 Type 1 diabetes mellitus with other circulatory complications: Secondary | ICD-10-CM | POA: Diagnosis present

## 2014-09-08 DIAGNOSIS — Z85850 Personal history of malignant neoplasm of thyroid: Secondary | ICD-10-CM | POA: Insufficient documentation

## 2014-09-08 DIAGNOSIS — Z9079 Acquired absence of other genital organ(s): Secondary | ICD-10-CM | POA: Insufficient documentation

## 2014-09-08 DIAGNOSIS — F3289 Other specified depressive episodes: Secondary | ICD-10-CM | POA: Insufficient documentation

## 2014-09-08 DIAGNOSIS — Z79899 Other long term (current) drug therapy: Secondary | ICD-10-CM | POA: Insufficient documentation

## 2014-09-08 DIAGNOSIS — K219 Gastro-esophageal reflux disease without esophagitis: Secondary | ICD-10-CM | POA: Insufficient documentation

## 2014-09-08 DIAGNOSIS — I10 Essential (primary) hypertension: Secondary | ICD-10-CM | POA: Insufficient documentation

## 2014-09-08 DIAGNOSIS — I509 Heart failure, unspecified: Secondary | ICD-10-CM | POA: Insufficient documentation

## 2014-09-08 DIAGNOSIS — E109 Type 1 diabetes mellitus without complications: Secondary | ICD-10-CM

## 2014-09-08 DIAGNOSIS — E785 Hyperlipidemia, unspecified: Secondary | ICD-10-CM | POA: Insufficient documentation

## 2014-09-08 HISTORY — PX: OTHER SURGICAL HISTORY: SHX169

## 2014-09-08 LAB — GLUCOSE, CAPILLARY
GLUCOSE-CAPILLARY: 270 mg/dL — AB (ref 70–99)
GLUCOSE-CAPILLARY: 111 mg/dL — AB (ref 70–99)
Glucose-Capillary: 209 mg/dL — ABNORMAL HIGH (ref 70–99)
Glucose-Capillary: 250 mg/dL — ABNORMAL HIGH (ref 70–99)

## 2014-09-08 MED ORDER — ATORVASTATIN CALCIUM 20 MG PO TABS
20.0000 mg | ORAL_TABLET | Freq: Every day | ORAL | Status: DC
  Administered 2014-09-08 – 2014-09-09 (×2): 20 mg via ORAL
  Filled 2014-09-08: qty 2
  Filled 2014-09-08 (×2): qty 1

## 2014-09-08 MED ORDER — ACETAMINOPHEN 500 MG PO TABS: 1000.0000 mg | ORAL_TABLET | Freq: Every day | ORAL | Status: DC | PRN

## 2014-09-08 MED ORDER — ISOSORBIDE MONONITRATE ER 30 MG PO TB24
30.0000 mg | ORAL_TABLET | Freq: Every day | ORAL | Status: DC
  Administered 2014-09-09: 30 mg via ORAL
  Filled 2014-09-08: qty 1

## 2014-09-08 MED ORDER — PEG-KCL-NACL-NASULF-NA ASC-C 100 G PO SOLR
0.5000 | Freq: Once | ORAL | Status: AC
  Administered 2014-09-09: 100 g via ORAL

## 2014-09-08 MED ORDER — INSULIN ASPART 100 UNIT/ML ~~LOC~~ SOLN: 0.0000 [IU] | Freq: Three times a day (TID) | SUBCUTANEOUS | Status: DC

## 2014-09-08 MED ORDER — LEVOTHYROXINE SODIUM 150 MCG PO TABS
150.0000 ug | ORAL_TABLET | Freq: Every day | ORAL | Status: DC
  Administered 2014-09-09: 150 ug via ORAL
  Filled 2014-09-08 (×2): qty 1

## 2014-09-08 MED ORDER — SODIUM CHLORIDE 0.9 % IJ SOLN
3.0000 mL | Freq: Two times a day (BID) | INTRAMUSCULAR | Status: DC
  Administered 2014-09-08 – 2014-09-09 (×3): 3 mL via INTRAVENOUS

## 2014-09-08 MED ORDER — PEG-KCL-NACL-NASULF-NA ASC-C 100 G PO SOLR
0.5000 | Freq: Once | ORAL | Status: AC
  Administered 2014-09-08: 100 g via ORAL
  Filled 2014-09-08 (×2): qty 1

## 2014-09-08 MED ORDER — ONDANSETRON HCL 4 MG PO TABS: 4.0000 mg | ORAL_TABLET | Freq: Four times a day (QID) | ORAL | Status: DC | PRN

## 2014-09-08 MED ORDER — LOSARTAN POTASSIUM-HCTZ 50-12.5 MG PO TABS: 1.0000 | ORAL_TABLET | Freq: Every day | ORAL | Status: DC

## 2014-09-08 MED ORDER — METOPROLOL SUCCINATE ER 50 MG PO TB24
50.0000 mg | ORAL_TABLET | Freq: Every day | ORAL | Status: DC
  Administered 2014-09-09: 50 mg via ORAL
  Filled 2014-09-08: qty 1

## 2014-09-08 MED ORDER — SODIUM CHLORIDE 0.9 % IV SOLN: 250.0000 mL | INTRAVENOUS | Status: DC | PRN

## 2014-09-08 MED ORDER — HYDROCHLOROTHIAZIDE 12.5 MG PO CAPS
12.5000 mg | ORAL_CAPSULE | Freq: Every day | ORAL | Status: DC
  Filled 2014-09-08: qty 1

## 2014-09-08 MED ORDER — INSULIN ASPART 100 UNIT/ML ~~LOC~~ SOLN
0.0000 [IU] | SUBCUTANEOUS | Status: DC
  Administered 2014-09-08: 3 [IU] via SUBCUTANEOUS
  Administered 2014-09-09: 5 [IU] via SUBCUTANEOUS

## 2014-09-08 MED ORDER — PEG-KCL-NACL-NASULF-NA ASC-C 100 G PO SOLR: 1.0000 | Freq: Once | ORAL | Status: DC

## 2014-09-08 MED ORDER — LOSARTAN POTASSIUM 50 MG PO TABS
50.0000 mg | ORAL_TABLET | Freq: Every day | ORAL | Status: DC
Start: 2014-09-09 — End: 2014-09-09
  Administered 2014-09-09: 50 mg via ORAL
  Filled 2014-09-08: qty 1

## 2014-09-08 MED ORDER — ACETAMINOPHEN 650 MG PO TABS: 1300.0000 mg | ORAL_TABLET | Freq: Every day | ORAL | Status: DC | PRN

## 2014-09-08 MED ORDER — PANTOPRAZOLE SODIUM 40 MG PO TBEC
40.0000 mg | DELAYED_RELEASE_TABLET | Freq: Every day | ORAL | Status: DC
  Administered 2014-09-09: 40 mg via ORAL
  Filled 2014-09-08: qty 1

## 2014-09-08 MED ORDER — SODIUM CHLORIDE 0.9 % IJ SOLN
3.0000 mL | INTRAMUSCULAR | Status: DC | PRN
Start: 2014-09-08 — End: 2014-09-09

## 2014-09-08 MED ORDER — ONDANSETRON HCL 4 MG/2ML IJ SOLN: 4.0000 mg | Freq: Four times a day (QID) | INTRAMUSCULAR | Status: DC | PRN

## 2014-09-08 NOTE — Progress Notes (Addendum)
Inpatient Diabetes Program Recommendations  AACE/ADA: New Consensus Statement on Inpatient Glycemic Control (2013)  Target Ranges:  Prepandial:   less than 140 mg/dL      Peak postprandial:   less than 180 mg/dL (1-2 hours)      Critically ill patients:  140 - 180 mg/dL     Results for RASHELLE, IRELAND (MRN [id number]) as of 09/08/2014 17:02  Ref. Range 09/08/2014 12:17 09/08/2014 16:19  Glucose-Capillary Latest Range: 70-99 mg/dL 270 (H) 111 (H)      Patient admitted for Inpatient Colonoscopy Prep.  Has history of DM.  Home DM Meds: Insulin Pump  Insulin Pump settings are as follows-  Basal Rates:  12AM- 0.65 units /hour 4:30AM- 0.8 units/hour 9AM- 1.1 units/hour 11AM- 0.875 units/hour 1:30PM- 1 unit/hour 3PM- 0.975 units/hour 6PM- 1.1 units/hour  Total Basal insulin per 24 hour period= 21.9375 units  Carbohydrate Ratio is 1 unit for every 8 grams carbohydrates  Correction Factor is 1 unit for every 23 mg/dl above Target CBG of 120 mg/dl  Target CBG= 120 mg/dl    Note patient told me the MD told her to remove her insulin pump this afternoon (not sure exactly what time pump was stopped but it was sometime before 4PM when I visited with patient).  Patient currently has order for Novolog Sensitive SSI tid at present.  Will need orders for Lantus while she is off her insulin pump to cover her basal needs.  Explained to patient that if we give her basal insulin here in the hospital, that she will need to wait 24 hours before resuming the basal rates on her pump after her procedure.  For example, if patient is given Lantus tonight at [facility], patient can put her pump back on her but she will need to suspend the basal rates until 8PM tomorrow night (09/16) so that the Lantus and her basal rates do not overlap (which could set her up for hypoglycemia if they did overlap).  Patient stated she understood and could do this.  Recommend the following:  1. Start Lantus 18 units daily  (start now) (18 units would be ~80% of the total basal dose patient gets on her pump in a 24 hour period) 2. Change Novolog Sensitive SSI to Q4 hour coverage 3. Have patient resume her insulin pump tomorrow after her procedure once she is fully awake- Have patient suspend her basal rates on her pump until 24 hours after Lantus given    Will follow Wyn Quaker RN, MSN, CDE Diabetes Coordinator Inpatient Diabetes Program Team Pager: [phone] (8a-10p)

## 2014-09-08 NOTE — Consult Note (Signed)
[facility] History and Physical Service Pager: [phone]  Patient name: [name] Medical record number: [id number] Date of birth: 1951/01/27 Age: 62 y.o. Gender: female  Primary Care Provider: Janora Norlander, DO Code Status: full   Chief Complaint: Elective colonoscopy for colorectal screening   Assessment and Plan: [name] is a 63 y.o. female presenting with elective colonoscopy. PMH is significant for type 1 DM.   #Type 1 DM: patient is maintained with an insulin pump. She gets a basal dose of roughly ~22 U per day. She checks her blood sugars 4-5 times per day and gives herself short acting based on a pre-determined setting that is regulated by her Endocrinologist. She is also taking invokona but hasn't taken it this morning or yesterday morning. She removed her pump at 1500 today. She received roughly half of her long acting for today.  - Sensitize SSI Q4  - CBG's Q4 - clears until midnight.  - NPO at midnight  - holding invokana   #colonoscopy: per GI   FEN/GI: NPO, saline lock  Prophylaxis: heparin subq  History of Present Illness: [name] is a 63 y.o. female presenting with elective colonoscopy. She was initially having the colonoscopy done in January but became hypoglycemic overnight and had to drink a coke. Her blood sugar was in the 300's when she presented for colonoscopy so it was delayed.  She is followed by an Endocrinologist but has not been able to follow up.    She is presenting for elective colonoscopy and blood sugar control.   Review Of Systems: Per HPI with the following additions: See HPI  Otherwise 12 point review of systems was performed and was unremarkable.  Patient Active Problem List   Diagnosis Date Noted  . Diabetes 09/08/2014  . Great toe pain 07/09/2014  . Posterior tibial tendinitis of right leg 04/26/2014  . Preventive measure 08/20/2013  . Morbid obesity 05/29/2013  . Back  pain without radiation 04/28/2013  . Nail dystrophy 01/10/2013  . Type 1 diabetes mellitus 01/25/2012  . Hyperlipidemia 01/25/2012  . Hypothyroidism 01/25/2012  . Hypertension 01/25/2012  . GERD (gastroesophageal reflux disease) 01/25/2012   Past Medical History: Past Medical History  Diagnosis Date  . Thyroid cancer     per pt S/p Total Thyroidectomy with Radioactive Iodine Therapy  . Hyperlipidemia   . Hypothyroidism   . Hypertension   . GERD (gastroesophageal reflux disease)   . Seasonal allergies   . Type 1 diabetes mellitus   . Hemorrhoids   . Depression   . Anemia   . Arthritis   . CHF (congestive heart failure)    Past Surgical History: Past Surgical History  Procedure Laterality Date  . Thyroidectomy  2006  . Abdominal hysterectomy  1995    partial  . Cardiac catheterization      with coronary angiogram  . Ankle fracture surgery Right   . Mini meter   09/08/2014    ELECTRONIC INSULIN PUMP   Social History: History  Substance Use Topics  . Smoking status: Never Smoker   . Smokeless tobacco: Never Used  . Alcohol Use: No   Additional social history: none  Please also refer to relevant sections of EMR.  Family History: Family History  Problem Relation Age of Onset  . Heart disease Mother     MI  . Hyperlipidemia Mother   . Hypertension Mother   . Early death Father     Head Injury at  Work  . Hyperlipidemia Father   . Hypertension Father   . Diabetes Maternal Aunt   . Prostate cancer Paternal Uncle   . Heart disease Maternal Aunt   . Renal Disease Mother     insufficiency   Allergies and Medications: No Known Allergies No current facility-administered medications on file prior to encounter.   Current Outpatient Prescriptions on File Prior to Encounter  Medication Sig Dispense Refill  . aspirin EC 81 MG tablet Take 1 tablet (81 mg total) by mouth daily.  90 tablet  4  . atorvastatin (LIPITOR) 20 MG tablet Take 1 tablet (20 mg total) by mouth  daily.  90 tablet  1  . CALCIUM PO Take 1 tablet by mouth daily as needed (for bone health). OTC      . Canagliflozin (INVOKANA) 100 MG TABS Take 1 tablet (100 mg total) by mouth daily.  30 tablet  5  . Insulin Human (INSULIN PUMP) 100 unit/ml SOLN Inject 1 each into the skin continuous. Uses novolog insulin in her pump.      . isosorbide mononitrate (IMDUR) 30 MG 24 hr tablet Take 1 tablet (30 mg total) by mouth daily.  90 tablet  3  . levothyroxine (SYNTHROID, LEVOTHROID) 150 MCG tablet Take 1 tablet (150 mcg total) by mouth daily before breakfast.  90 tablet  1  . losartan-hydrochlorothiazide (HYZAAR) 50-12.5 MG per tablet Take 1 tablet by mouth daily.      . pantoprazole (PROTONIX) 40 MG tablet Take 1 tablet (40 mg total) by mouth daily.  90 tablet  3  . POTASSIUM PO Take 1 tablet by mouth daily as needed (for leg cramps). OTC        Objective: BP 100/60  Pulse 66  Temp(Src) 98 F (36.7 C) (Oral)  Resp 16  Ht 5' 6.5" (1.689 m)  Wt 249 lb 12.8 oz (113.309 kg)  BMI 39.72 kg/m2  SpO2 99% Exam: General: NAD, alert, well appearing  HEENT: EOMI, [place]/AT,  Cardiovascular: S1S2, RRR, no murmurs, rubs or gallops  Respiratory: CTAB, no wheezes or crackles  Abdomen: soft, NTND, no HSM  Extremities: moves all freely, +2 pulses intact  Skin: no rash  Neuro: no gross deficits.   Labs and Imaging: CBC BMET  No results found for this basename: WBC, HGB, HCT, PLT,  in the last 168 hours No results found for this basename: NA, K, CL, CO2, BUN, CREATININE, GLUCOSE, CALCIUM,  in the last 168 hours     Rosemarie Ax, MD 09/08/2014, 3:37 PM PGY-2, [place] Intern pager: [phone], text pages welcome

## 2014-09-08 NOTE — H&P (Signed)
Agree w/ Ms. Hvozdovic's note and mangement. I have seen the patient also.

## 2014-09-08 NOTE — H&P (Signed)
[facility] Admission Note   Primary Care Physician:  Janora Norlander, DO Primary Gastroenterologist:  Dr. Carlean Purl  Reason for admission: Diabetic. Needs inpatient colonoscopy prep.  HPI: Patient is a very nice 63 year old African American woman who is due for a screening colonoscopy.She had a colonoscopy 10 years ago elsewhere and had no polyps, and was advised to repeat colonoscopy in 10 years. She has type 1 diabetes mellitus and is on insulin pump. She has been having problems with low sugars and when she actually prepped for a colonoscopy recently with another physician, she had a drink some spite in the middle of night to raise her sugar and her colonoscopy was then subsequently canceled. Her primary care physician is thought that she needs to be admitted to the hospital for observation status and care to prevent hypoglycemia during the prep. She does not have any active GI complaints at this time. This admission was set up as a day before admission and prep as her colonoscopy is scheduled for tomorrow.    Past Medical History  Diagnosis Date  . Thyroid cancer     per pt S/p Total Thyroidectomy with Radioactive Iodine Therapy  . Hyperlipidemia   . Hypothyroidism   . Hypertension   . GERD (gastroesophageal reflux disease)   . Seasonal allergies   . Type 1 diabetes mellitus   . Hemorrhoids   . Depression   . Anemia   . Arthritis   . CHF (congestive heart failure)     Past Surgical History  Procedure Laterality Date  . Thyroidectomy  2006  . Abdominal hysterectomy  1995    partial  . Cardiac catheterization      with coronary angiogram  . Ankle fracture surgery Right     Prior to Admission medications   Medication Sig Start Date End Date Taking? Authorizing Provider  Acetaminophen 650 MG TABS Take 1,300 mg by mouth daily as needed (for pain).   Yes Historical Provider, MD  aspirin EC 81 MG tablet Take 1  tablet (81 mg total) by mouth daily. 07/10/14  Yes Ashly Windell Moulding, DO  atorvastatin (LIPITOR) 20 MG tablet Take 1 tablet (20 mg total) by mouth daily. 06/19/14  Yes Gerda Diss, DO  CALCIUM PO Take 1 tablet by mouth daily as needed (for bone health). OTC   Yes Historical Provider, MD  Canagliflozin (INVOKANA) 100 MG TABS Take 1 tablet (100 mg total) by mouth daily. 04/21/14  Yes Gerda Diss, DO  GARLIC PO Take 175 mg by mouth daily as needed (for blood flow).    Yes Historical Provider, MD  Insulin Human (INSULIN PUMP) 100 unit/ml SOLN Inject 1 each into the skin continuous. Uses novolog insulin in her pump.   Yes Historical Provider, MD  isosorbide mononitrate (IMDUR) 30 MG 24 hr tablet Take 1 tablet (30 mg total) by mouth daily. 06/19/14  Yes Gerda Diss, DO  levothyroxine (SYNTHROID, LEVOTHROID) 150 MCG tablet Take 1 tablet (150 mcg total) by mouth daily before breakfast. 06/19/14  Yes Gerda Diss, DO  loperamide (IMODIUM) 1 MG/5ML solution Take 2 mg by mouth as needed for diarrhea or loose stools.   Yes Historical Provider, MD  losartan-hydrochlorothiazide (HYZAAR) 50-12.5 MG per tablet Take 1 tablet by mouth daily.   Yes Historical Provider, MD  meloxicam (MOBIC) 7.5 MG tablet Take 7.5 mg by mouth daily as needed for pain.   Yes Historical Provider, MD  metoprolol succinate (TOPROL-XL) 50 MG 24 hr tablet  Take 50 mg by mouth daily. Take with or immediately following a meal.   Yes Historical Provider, MD  pantoprazole (PROTONIX) 40 MG tablet Take 1 tablet (40 mg total) by mouth daily. 06/19/14  Yes Gerda Diss, DO  POTASSIUM PO Take 1 tablet by mouth daily as needed (for leg cramps). OTC   Yes Historical Provider, MD    No current facility-administered medications for this encounter.    Allergies as of 07/20/2014  . (No Known Allergies)    Family History  Problem Relation Age of Onset  . Heart disease Mother     MI  . Hyperlipidemia Mother   . Hypertension Mother   .  Early death Father     Head Injury at Work  . Hyperlipidemia Father   . Hypertension Father   . Diabetes Maternal Aunt   . Prostate cancer Paternal Uncle   . Heart disease Maternal Aunt   . Renal Disease Mother     insufficiency    History   Social History  . Marital Status: Divorced    Spouse Name: N/A    Number of Children: 1  . Years of Education: N/A   Occupational History  . retired Pharmacist, hospital    Social History Main Topics  . Smoking status: Never Smoker   . Smokeless tobacco: Never Used  . Alcohol Use: No  . Drug Use: No  . Sexual Activity: Not on file   Other Topics Concern  . Not on file   Social History Narrative   Retired Pharmacist, hospital, high school teacher taught mass. One son, helping to care for her granddaughter. No caffeine. Updated 07/20/2014.    REVIEW OF SYSTEMS: Constitutional:  Denies fever, chills, night sweats ENT:  No hearing loss, sinus problems or sore throat Pulm:  No cough or SOB CV:  No CP GU:  No dysuria GI:  No nausea, vomiting dysphagia or diarrhea. Heme:  Denies bruising or abnormal bleeding  Transfusions: Denies Neuro:  Denies headaches, dizziness Derm:  No rashes Endocrine:  Pt is diabetic. Has had thyroidectomy due to thyroid cancer.   PHYSICAL EXAM: Temp 98 pulse 66, BP 100/60, SpO2 99% on room air wt 249 lb  Constitutional: She is well-developed, well-nourished, and in no distress.  HEENT: Normocephalic and atraumatic. Right Ear: External ear normal.  Left Ear: External ear normal. Neck: Normal range of motion. Neck supple. No JVD present. No tracheal deviation present.  Cardiovascular: Normal rate, regular rhythm and normal heart sounds. Exam reveals no gallop and no friction rub. No murmur heard.  Pulmonary/Chest: Effort normal and breath sounds normal. No respiratory distress. She has no wheezes. She has no rales.  Abdominal: Soft. NT, positive bowel sounds, no HSM Rectal deferred  Neurological: She is alert, oriented x 3.     Skin: Skin is warm and dry. She is not diaphoretic.  Psychiatric: Mood, memory, affect and judgment normal.   IMPRESSION/PLAN:  1.  Special screening for malignant neoplasms, colon   2.  Type 1 diabetes mellitus on insulin pump. History hypoglycemia with previous colon prep. She has been admitted for observation with in patient prep. Family medicine has been consulted to manage her insulin.      LOS: 0 days   Judye Lorino, Vita Barley  09/08/2014, 12:38 PM Pager [phone]

## 2014-09-09 ENCOUNTER — Encounter (HOSPITAL_COMMUNITY): Payer: BC Managed Care – PPO | Admitting: Anesthesiology

## 2014-09-09 ENCOUNTER — Encounter (HOSPITAL_COMMUNITY): Payer: Self-pay | Admitting: *Deleted

## 2014-09-09 ENCOUNTER — Encounter (HOSPITAL_COMMUNITY)
Admission: RE | Disposition: A | Discharge status: Home / Self Care | Payer: Self-pay | Source: Ambulatory Visit | Attending: Internal Medicine

## 2014-09-09 ENCOUNTER — Observation Stay (HOSPITAL_COMMUNITY): Payer: BC Managed Care – PPO | Admitting: Anesthesiology

## 2014-09-09 DIAGNOSIS — Z1211 Encounter for screening for malignant neoplasm of colon: Secondary | ICD-10-CM

## 2014-09-09 HISTORY — PX: COLONOSCOPY: SHX5424

## 2014-09-09 LAB — GLUCOSE, CAPILLARY
GLUCOSE-CAPILLARY: 194 mg/dL — AB (ref 70–99)
GLUCOSE-CAPILLARY: 247 mg/dL — AB (ref 70–99)
Glucose-Capillary: 312 mg/dL — ABNORMAL HIGH (ref 70–99)
Glucose-Capillary: 249 mg/dL — ABNORMAL HIGH (ref 70–99)
Glucose-Capillary: 192 mg/dL — ABNORMAL HIGH (ref 70–99)
Glucose-Capillary: 238 mg/dL — ABNORMAL HIGH (ref 70–99)
Glucose-Capillary: 180 mg/dL — ABNORMAL HIGH (ref 70–99)

## 2014-09-09 SURGERY — COLONOSCOPY
Anesthesia: Monitor Anesthesia Care

## 2014-09-09 MED ORDER — PROPOFOL INFUSION 10 MG/ML OPTIME
INTRAVENOUS | Status: DC | PRN
  Administered 2014-09-09: 100 ug/kg/min via INTRAVENOUS

## 2014-09-09 MED ORDER — INSULIN ASPART 100 UNIT/ML ~~LOC~~ SOLN
0.0000 [IU] | SUBCUTANEOUS | Status: DC
  Administered 2014-09-09: 3 [IU] via SUBCUTANEOUS

## 2014-09-09 MED ORDER — SODIUM CHLORIDE 0.9 % IV SOLN
INTRAVENOUS | Status: DC | PRN
  Administered 2014-09-09: 13:00:00 via INTRAVENOUS

## 2014-09-09 MED ORDER — SODIUM CHLORIDE 0.9 % IV SOLN
INTRAVENOUS | Status: DC
  Administered 2014-09-09: 11:00:00 via INTRAVENOUS
  Administered 2014-09-09: 500 mL via INTRAVENOUS

## 2014-09-09 MED ORDER — PHENYLEPHRINE HCL 10 MG/ML IJ SOLN
INTRAMUSCULAR | Status: DC | PRN
  Administered 2014-09-09: 80 ug via INTRAVENOUS

## 2014-09-09 MED ORDER — LIDOCAINE HCL (CARDIAC) 20 MG/ML IV SOLN
INTRAVENOUS | Status: DC | PRN
  Administered 2014-09-09: 40 mg via INTRAVENOUS

## 2014-09-09 NOTE — Progress Notes (Signed)
FMTS ATTENDING NOTE Oluwatosin Higginson,MD I  have seen and examined this patient, reviewed their chart. I have discussed this patient with the resident. I agree with the resident's findings, assessment and care plan. Patient is doing well this morning, no complaints, she has undergone bowel prep and has been moving her bowel in the last few hrs. Denies any dizziness or weakness. Currently NPO awaiting colonoscopy. We will continue to hold her insulin pump till after procedure when she can get back on her home regimen. We will continue to follow her with GI.

## 2014-09-09 NOTE — Progress Notes (Signed)
Utilization Review Completed.Sanae Willetts T9/16/2015  

## 2014-09-09 NOTE — Consult Note (Signed)
FMTS ATTENDING ADMISSION NOTE [name] Ruble,[name] I  have seen and examined this patient, reviewed their chart. I have discussed this patient with the resident. I agree with the resident's findings, assessment and care plan.  63 Y/O F with hx of type 1 DM on insulin drip and Invokana, admitted by GI for colonoscopy. She denies any complaints,no GI symptoms. Feels well in general. We were consulted for her DM management.  No current facility-administered medications on file prior to encounter.   Current Outpatient Prescriptions on File Prior to Encounter  Medication Sig Dispense Refill  . aspirin EC 81 MG tablet Take 1 tablet (81 mg total) by mouth daily.  90 tablet  4  . atorvastatin (LIPITOR) 20 MG tablet Take 1 tablet (20 mg total) by mouth daily.  90 tablet  1  . CALCIUM PO Take 1 tablet by mouth daily as needed (for bone health). OTC      . Canagliflozin (INVOKANA) 100 MG TABS Take 1 tablet (100 mg total) by mouth daily.  30 tablet  5  . Insulin Human (INSULIN PUMP) 100 unit/ml SOLN Inject 1 each into the skin continuous. Uses novolog insulin in her pump.      . isosorbide mononitrate (IMDUR) 30 MG 24 hr tablet Take 1 tablet (30 mg total) by mouth daily.  90 tablet  3  . levothyroxine (SYNTHROID, LEVOTHROID) 150 MCG tablet Take 1 tablet (150 mcg total) by mouth daily before breakfast.  90 tablet  1  . losartan-hydrochlorothiazide (HYZAAR) 50-12.5 MG per tablet Take 1 tablet by mouth daily.      . pantoprazole (PROTONIX) 40 MG tablet Take 1 tablet (40 mg total) by mouth daily.  90 tablet  3  . POTASSIUM PO Take 1 tablet by mouth daily as needed (for leg cramps). OTC       Past Medical History  Diagnosis Date  . Thyroid cancer     per pt S/p Total Thyroidectomy with Radioactive Iodine Therapy  . Hyperlipidemia   . Hypothyroidism   . Hypertension   . GERD (gastroesophageal reflux disease)   . Seasonal allergies   . Type 1 diabetes mellitus   . Hemorrhoids   . Depression   . Anemia    . Arthritis   . CHF (congestive heart failure)    Filed Vitals:   09/08/14 1203 09/08/14 2035  BP: 100/60 116/54  Pulse: 66 68  Temp: 98 F (36.7 C) 98.1 F (36.7 C)  TempSrc: Oral Oral  Resp: 16 18  Height: 5' 6.5" (1.689 m)   Weight: 249 lb 12.8 oz (113.309 kg)   SpO2: 99% 99%   Exam: Gen: Not in distress, well appearing. HEENT: EOMI, pERRLA. Resp: Air entry equal and clear B/L Heart: S1 S2 normal, no murmur. Abd: Soft, NT/ND, BS+ and normal. Ext: No edema.  A/P: 63 Y/O F with 1. T1DM: I agree with holding insulin pump while awaiting colonoscopy.     SSI coverage and frequent CBG.     NPO after midnight for procedure.     Will benefit fro IV hydration once NPO.  2. Colonoscopy:  Bowel prep per GI.

## 2014-09-09 NOTE — Progress Notes (Signed)
AVS discharge instructions were given and went over with patient. Patient stated that she did not have any questions. Staff will assist patient to her transportation.

## 2014-09-09 NOTE — Progress Notes (Signed)
**  Social Note**  I was able to speak with [name] this afternoon after her procedure.  She reports that procedure went smoothly.  She does voice concerns regarding her relationship with her endocrinologist and relays that she that she would like to be referred to another endocrinology provider.  I told her that I would do my best to set her up with a new provider when I see her out patient.  She voices great appreciation for the care being provided by the [facility] team.  Thank you for the awesome care you are providing [name]!  Ashly M. Lajuana Ripple, DO PGY-1, [facility] 09/09/14, 4:23pm

## 2014-09-09 NOTE — Progress Notes (Signed)
Inpatient Diabetes Program Recommendations  AACE/ADA: New Consensus Statement on Inpatient Glycemic Control (2013)  Target Ranges:  Prepandial:   less than 140 mg/dL      Peak postprandial:   less than 180 mg/dL (1-2 hours)      Critically ill patients:  140 - 180 mg/dL     Results for TIMIKO, OFFUTT (MRN [id number]) as of 09/09/2014 10:07  Ref. Range 09/09/2014 01:27 09/09/2014 04:55 09/09/2014 07:28 09/09/2014 09:48  Glucose-Capillary Latest Range: 70-99 mg/dL 194 (H) 312 (H) 249 (H) 192 (H)     Note that decision made to not order basal insulin for patient.  Patient came off her insulin pump around 3pm yesterday.  Basal rates on insulin pump do not last 24 hours like Lantus or Levemir since the basal function on an insulin pump is using rapid-acting insulin.  Note that Novolog SSI increased to Moderate scale today Q4 hours.   MD- Please make sure to have patient resume her insulin pump post-procedure as soon as she is awake enough to control her pump independently    Will follow Wyn Quaker RN, MSN, CDE Diabetes Coordinator Inpatient Diabetes Program Team Pager: [phone] (8a-10p)

## 2014-09-09 NOTE — Progress Notes (Signed)
Family Medicine Teaching Service Daily Progress Note Intern Pager: [phone]  Patient name: [name] Medical record number: [id number] Date of birth: 14-Jul-1951 Age: 63 y.o. Gender: female  Primary Care Provider: Janora Norlander, DO Code Status: full   Assessment and Plan: [name] is a 63 y.o. female presenting with elective colonoscopy. PMH is significant for type 1 DM.   #Type 1 DM: Sugars are elevated but expected.    - Moderate SSI Q4, changed this AM for elevated BG's  - CBG's Q4   - NPO at midnight  - holding invokana  - restart home pump when awake from colonoscopy   #colonoscopy: per GI   FEN/GI: NPO, saline lock  Prophylaxis: heparin subq  Subjective:  Feels well with no complaints.   Objective: Temp:  [97.7 F (36.5 C)-98.1 F (36.7 C)] 98.1 F (36.7 C) (09/16 0955) Pulse Rate:  [66-75] 66 (09/16 0955) Resp:  [16-18] 16 (09/16 0955) BP: (100-118)/(49-60) 118/58 mmHg (09/16 0955) SpO2:  [98 %-100 %] 100 % (09/16 0955) Weight:  [249 lb 12.8 oz (113.309 kg)] 249 lb 12.8 oz (113.309 kg) (09/15 1203) Physical Exam: General: NAD, alert, well appearing  Cardiovascular: S1S2, RRR, no murmurs, rubs or gallops  Respiratory: CTAB, no wheezes or crackles  Extremities: moves all freely, +2 pulses intact  Skin: no rash  Neuro: no gross deficits.    Recent Labs Lab 09/08/14 2141 09/09/14 0127 09/09/14 0455 09/09/14 0728 09/09/14 0948  GLUCAP 250* 194* 312* 249* 192*     Rosemarie Ax, MD 09/09/2014, 12:00 PM PGY-2, [place] Intern pager: [phone], text pages welcome

## 2014-09-09 NOTE — Progress Notes (Signed)
     [facility] Progress Note  Subjective:   Has completed most of  movi prep. Was unable to drink all of second dose. Last BMs watery/light yellow per patient. Colonoscopy scheduled for  This afternoon. No complaints.      Objective:  Vital signs in last 24 hours: Temp:  [97.7 F (36.5 C)-98.1 F (36.7 C)] 98.1 F (36.7 C) (09/16 0955) Pulse Rate:  [66-75] 66 (09/16 0955) Resp:  [16-18] 16 (09/16 0955) BP: (100-118)/(49-60) 118/58 mmHg (09/16 0955) SpO2:  [98 %-100 %] 100 % (09/16 0955) Weight:  [249 lb 12.8 oz (113.309 kg)] 249 lb 12.8 oz (113.309 kg) (09/15 1203) Last BM Date: 09/08/14 General:   Alert,  Well-developed, female   in NAD Heart:  Regular rate and rhythm; no murmurs Pulm lungs clear to ausc bilat Abdomen:  Soft, nontender and nondistended. Normal bowel sounds, without guarding, and without rebound.   Extremities:  Without edema. Neurologic:  Alert and  oriented x4;  grossly normal neurologically. Psych:  Alert and cooperative. Normal mood and affect.   ASSESSMENT/PLAN:   63 yo female with type 1 diabetes, admitted for observation with in patient bowel prep for colonoscopy. Pt has completed most of her prep but feels unable to drink the rest. Pt for colonoscopy this afternoon.     LOS: 1 day   Miosha Behe, Vita Barley PA-C 09/09/2014, Pager [phone]

## 2014-09-09 NOTE — Op Note (Signed)
[facility] [place], [id number]   COLONOSCOPY PROCEDURE REPORT  PATIENT: [name], Mode  MR#: [id number] BIRTHDATE: 01/07/1951 , 63  yrs. old GENDER: Female ENDOSCOPIST: Gatha Mayer, MD, [facility] PROCEDURE DATE:  09/09/2014 PROCEDURE:   Colonoscopy, screening First Screening Colonoscopy - Avg.  risk and is 50 yrs.  old or older - No.  Prior Negative Screening - Now for repeat screening. 10 or more years since last screening  History of Adenoma - Now for follow-up colonoscopy & has been > or = to 3 yrs.  N/A  Polyps Removed Today? No.  Recommend repeat exam, <10 yrs? No. ASA CLASS:   Class III INDICATIONS:average risk screening and Last colonoscopy performed 10 years ago. MEDICATIONS: See Anesthesia Report.  DESCRIPTION OF PROCEDURE:   After the risks benefits and alternatives of the procedure were thoroughly explained, informed consent was obtained.  A digital rectal exam revealed no abnormalities of the rectum.   The     endoscope was introduced through the anus and advanced to the cecum, which was identified by both the appendix and ileocecal valve. No adverse events experienced.   The quality of the prep was good, using MoviPrep The instrument was then slowly withdrawn as the colon was fully examined.      COLON FINDINGS: A normal appearing cecum, ileocecal valve, and appendiceal orifice were identified.  The ascending, hepatic flexure, transverse, splenic flexure, descending, sigmoid colon and rectum appeared unremarkable.  No polyps or cancers were seen. Retroflexed views revealed no abnormalities. The time to cecum=3 minutes 0 seconds.  Withdrawal time=10 minutes 0 seconds.  The scope was withdrawn and the procedure completed. COMPLICATIONS: There were no complications.  ENDOSCOPIC IMPRESSION: Normal colonoscopy - good prep  RECOMMENDATIONS: Repeat colonoscopy 10 years.   eSigned:  Gatha Mayer, MD, [facility]  09/09/2014 2:05 PM   cc: The Patient

## 2014-09-09 NOTE — Anesthesia Preprocedure Evaluation (Signed)
Anesthesia Evaluation  Patient identified by MRN, date of birth, ID band Patient awake    Reviewed: Allergy & Precautions, H&P , NPO status , Patient's Chart, lab work & pertinent test results  Airway       Dental   Pulmonary          Cardiovascular hypertension, +CHF     Neuro/Psych    GI/Hepatic GERD-  ,  Endo/Other  diabetes, Type 1, Insulin DependentMorbid obesity  Renal/GU      Musculoskeletal  (+) Arthritis -,   Abdominal   Peds  Hematology  (+) anemia ,   Anesthesia Other Findings   Reproductive/Obstetrics                           Anesthesia Physical Anesthesia Plan  ASA: IV  Anesthesia Plan: MAC   Post-op Pain Management:    Induction: Intravenous  Airway Management Planned: Mask  Additional Equipment:   Intra-op Plan:   Post-operative Plan:   Informed Consent: I have reviewed the patients History and Physical, chart, labs and discussed the procedure including the risks, benefits and alternatives for the proposed anesthesia with the patient or authorized representative who has indicated his/her understanding and acceptance.     Plan Discussed with: CRNA, Anesthesiologist and Surgeon  Anesthesia Plan Comments:         Anesthesia Quick Evaluation

## 2014-09-09 NOTE — Transfer of Care (Signed)
Immediate Anesthesia Transfer of Care Note  Patient: [name]  Procedure(s) Performed: Procedure(s): COLONOSCOPY (N/A)  Patient Location: Endoscopy Unit  Anesthesia Type:MAC  Level of Consciousness: awake, alert  and oriented  Airway & Oxygen Therapy: Patient Spontanous Breathing  Post-op Assessment: Report given to PACU RN  Post vital signs: Reviewed and stable  Complications: No apparent anesthesia complications

## 2014-09-10 ENCOUNTER — Encounter (HOSPITAL_COMMUNITY): Payer: Self-pay | Admitting: Internal Medicine

## 2014-09-10 DIAGNOSIS — Z1211 Encounter for screening for malignant neoplasm of colon: Principal | ICD-10-CM

## 2014-09-10 SURGERY — COLONOSCOPY
Anesthesia: Monitor Anesthesia Care

## 2014-09-10 NOTE — Anesthesia Postprocedure Evaluation (Signed)
  Anesthesia Post-op Note  Patient: [name]  Procedure(s) Performed: Procedure(s): COLONOSCOPY (N/A)  Patient Location: PACU  Anesthesia Type:MAC  Level of Consciousness: awake, alert , oriented and patient cooperative  Airway and Oxygen Therapy: Patient Spontanous Breathing  Post-op Pain: none  Post-op Assessment: Post-op Vital signs reviewed, Patient's Cardiovascular Status Stable, Respiratory Function Stable, Patent Airway, No signs of Nausea or vomiting and Pain level controlled  Post-op Vital Signs: stable  Last Vitals:  Filed Vitals:   09/09/14 1435  BP: 126/45  Pulse: 97  Temp:   Resp: 14    Complications: No apparent anesthesia complications

## 2014-09-10 NOTE — Discharge Summary (Signed)
   The patiebt was placed in observation for a screening colonoscopy due to type I diabetes adn treatment with insulin pump - concern for hypoglycemia. She prepped w/o problems, had a NL colonoscopy and Family Medicine handled her glucose control.  She was released to home on her previous medications and a diabetic diet.  Gatha Mayer, MD, [name] [phone] (pager) 09/10/2014 12:09 PM

## 2014-09-14 ENCOUNTER — Telehealth: Payer: Self-pay | Admitting: Family Medicine

## 2014-09-14 ENCOUNTER — Other Ambulatory Visit: Payer: Self-pay | Admitting: Family Medicine

## 2014-09-14 MED ORDER — METOPROLOL SUCCINATE ER 50 MG PO TB24: 50.0000 mg | ORAL_TABLET | Freq: Every day | ORAL | Status: DC

## 2014-09-14 NOTE — Progress Notes (Signed)
Called patient to clarify.  Patient was contacted by pharmacy a while ago and switched to the metoprolol tartrate because it was supposed to be more cost effective.  However, patient has decided that she now is taking too many pills and has concerns for her memory (eg not remembering to take the second dose).  She would like to be switched back to the Toprol XL 50.  I am agreeable to this and have [facility], instructing them to replace her old rx with the new extended release.  Patient voiced good understanding of the changes and plans on scheduling an appointment tomorrow to review her medications.  Ashly M. Lajuana Ripple, DO PGY-1, [facility] 09/14/2014, 06:02 pm

## 2014-09-14 NOTE — Telephone Encounter (Signed)
Sent in Rx.  Thanks!

## 2014-09-14 NOTE — Telephone Encounter (Signed)
Pt called and would like to be put back on Metoprolol succinate since the another medication makes her cough all the time. jw

## 2014-09-30 ENCOUNTER — Ambulatory Visit (INDEPENDENT_AMBULATORY_CARE_PROVIDER_SITE_OTHER): Payer: BC Managed Care – PPO | Admitting: Family Medicine

## 2014-09-30 ENCOUNTER — Other Ambulatory Visit: Payer: Self-pay

## 2014-09-30 ENCOUNTER — Encounter: Payer: Self-pay | Admitting: Family Medicine

## 2014-09-30 VITALS — BP 101/65 | HR 70 | Temp 97.8°F | Ht 67.0 in | Wt 255.0 lb

## 2014-09-30 DIAGNOSIS — I10 Essential (primary) hypertension: Secondary | ICD-10-CM

## 2014-09-30 DIAGNOSIS — Z1231 Encounter for screening mammogram for malignant neoplasm of breast: Secondary | ICD-10-CM

## 2014-09-30 DIAGNOSIS — Z23 Encounter for immunization: Secondary | ICD-10-CM

## 2014-09-30 DIAGNOSIS — Z0000 Encounter for general adult medical examination without abnormal findings: Secondary | ICD-10-CM | POA: Insufficient documentation

## 2014-09-30 DIAGNOSIS — E109 Type 1 diabetes mellitus without complications: Secondary | ICD-10-CM | POA: Diagnosis not present

## 2014-09-30 NOTE — Patient Instructions (Signed)
It was a pleasure seeing you today!  Information regarding what we discussed is included in this packet.  Please feel free to call our office if any questions or concerns arise.  Please schedule an appointment to see me for a physical exam with fasting labs at your earliest convenience.  Ilisa Hayworth M. Kierah Goatley, DO  Health Maintenance Adopting a healthy lifestyle and getting preventive care can go a long way to promote health and wellness. Talk with your health care provider about what schedule of regular examinations is right for you. This is a good chance for you to check in with your provider about disease prevention and staying healthy. In between checkups, there are plenty of things you can do on your own. Experts have done a lot of research about which lifestyle changes and preventive measures are most likely to keep you healthy. Ask your health care provider for more information. WEIGHT AND DIET  Eat a healthy diet  Be sure to include plenty of vegetables, fruits, low-fat dairy products, and lean protein.  Do not eat a lot of foods high in solid fats, added sugars, or salt.  Get regular exercise. This is one of the most important things you can do for your health.  Most adults should exercise for at least 150 minutes each week. The exercise should increase your heart rate and make you sweat (moderate-intensity exercise).  Most adults should also do strengthening exercises at least twice a week. This is in addition to the moderate-intensity exercise.  Maintain a healthy weight  Body mass index (BMI) is a measurement that can be used to identify possible weight problems. It estimates body fat based on height and weight. Your health care provider can help determine your BMI and help you achieve or maintain a healthy weight.  For females 43 years of age and older:   A BMI below 18.5 is considered underweight.  A BMI of 18.5 to 24.9 is normal.  A BMI of 25 to 29.9 is considered  overweight.  A BMI of 30 and above is considered obese.  Watch levels of cholesterol and blood lipids  You should start having your blood tested for lipids and cholesterol at 63 years of age, then have this test every 5 years.  You may need to have your cholesterol levels checked more often if:  Your lipid or cholesterol levels are high.  You are older than 63 years of age.  You are at high risk for heart disease.  CANCER SCREENING   Lung Cancer  Lung cancer screening is recommended for adults 76-87 years old who are at high risk for lung cancer because of a history of smoking.  A yearly low-dose CT scan of the lungs is recommended for people who:  Currently smoke.  Have quit within the past 15 years.  Have at least a 30-pack-year history of smoking. A pack year is smoking an average of one pack of cigarettes a day for 1 year.  Yearly screening should continue until it has been 15 years since you quit.  Yearly screening should stop if you develop a health problem that would prevent you from having lung cancer treatment.  Breast Cancer  Practice breast self-awareness. This means understanding how your breasts normally appear and feel.  It also means doing regular breast self-exams. Let your health care provider know about any changes, no matter how small.  If you are in your 20s or 30s, you should have a clinical breast exam (CBE) by a  health care provider every 1-3 years as part of a regular health exam.  If you are 26 or older, have a CBE every year. Also consider having a breast X-ray (mammogram) every year.  If you have a family history of breast cancer, talk to your health care provider about genetic screening.  If you are at high risk for breast cancer, talk to your health care provider about having an MRI and a mammogram every year.  Breast cancer gene (BRCA) assessment is recommended for women who have family members with BRCA-related cancers. BRCA-related  cancers include:  Breast.  Ovarian.  Tubal.  Peritoneal cancers.  Results of the assessment will determine the need for genetic counseling and BRCA1 and BRCA2 testing. Cervical Cancer Routine pelvic examinations to screen for cervical cancer are no longer recommended for nonpregnant women who are considered low risk for cancer of the pelvic organs (ovaries, uterus, and vagina) and who do not have symptoms. A pelvic examination may be necessary if you have symptoms including those associated with pelvic infections. Ask your health care provider if a screening pelvic exam is right for you.   The Pap test is the screening test for cervical cancer for women who are considered at risk.  If you had a hysterectomy for a problem that was not cancer or a condition that could lead to cancer, then you no longer need Pap tests.  If you are older than 65 years, and you have had normal Pap tests for the past 10 years, you no longer need to have Pap tests.  If you have had past treatment for cervical cancer or a condition that could lead to cancer, you need Pap tests and screening for cancer for at least 20 years after your treatment.  If you no longer get a Pap test, assess your risk factors if they change (such as having a new sexual partner). This can affect whether you should start being screened again.  Some women have medical problems that increase their chance of getting cervical cancer. If this is the case for you, your health care provider may recommend more frequent screening and Pap tests.  The human papillomavirus (HPV) test is another test that may be used for cervical cancer screening. The HPV test looks for the virus that can cause cell changes in the cervix. The cells collected during the Pap test can be tested for HPV.  The HPV test can be used to screen women 22 years of age and older. Getting tested for HPV can extend the interval between normal Pap tests from three to five  years.  An HPV test also should be used to screen women of any age who have unclear Pap test results.  After 62 years of age, women should have HPV testing as often as Pap tests.  Colorectal Cancer  This type of cancer can be detected and often prevented.  Routine colorectal cancer screening usually begins at 63 years of age and continues through 63 years of age.  Your health care provider may recommend screening at an earlier age if you have risk factors for colon cancer.  Your health care provider may also recommend using home test kits to check for hidden blood in the stool.  A small camera at the end of a tube can be used to examine your colon directly (sigmoidoscopy or colonoscopy). This is done to check for the earliest forms of colorectal cancer.  Routine screening usually begins at age 60.  Direct examination of  colon should be repeated every 5-10 years through 63 years of age. However, you may need to be screened more often if early forms of precancerous polyps or small growths are found. Skin Cancer  Check your skin from head to toe regularly.  Tell your health care provider about any new moles or changes in moles, especially if there is a change in a mole's shape or color.  Also tell your health care provider if you have a mole that is larger than the size of a pencil eraser.  Always use sunscreen. Apply sunscreen liberally and repeatedly throughout the day.  Protect yourself by wearing long sleeves, pants, a wide-brimmed hat, and sunglasses whenever you are outside. HEART DISEASE, DIABETES, AND HIGH BLOOD PRESSURE   Have your blood pressure checked at least every 1-2 years. High blood pressure causes heart disease and increases the risk of stroke.  If you are between 55 years and 79 years old, ask your health care provider if you should take aspirin to prevent strokes.  Have regular diabetes screenings. This involves taking a blood sample to check your fasting  blood sugar level.  If you are at a normal weight and have a low risk for diabetes, have this test once every three years after 63 years of age.  If you are overweight and have a high risk for diabetes, consider being tested at a younger age or more often. PREVENTING INFECTION  Hepatitis B  If you have a higher risk for hepatitis B, you should be screened for this virus. You are considered at high risk for hepatitis B if:  You were born in a country where hepatitis B is common. Ask your health care provider which countries are considered high risk.  Your parents were born in a high-risk country, and you have not been immunized against hepatitis B (hepatitis B vaccine).  You have HIV or AIDS.  You use needles to inject street drugs.  You live with someone who has hepatitis B.  You have had sex with someone who has hepatitis B.  You get hemodialysis treatment.  You take certain medicines for conditions, including cancer, organ transplantation, and autoimmune conditions. Hepatitis C  Blood testing is recommended for:  Everyone born from 1945 through 1965.  Anyone with known risk factors for hepatitis C. Sexually transmitted infections (STIs)  You should be screened for sexually transmitted infections (STIs) including gonorrhea and chlamydia if:  You are sexually active and are younger than 63 years of age.  You are older than 63 years of age and your health care provider tells you that you are at risk for this type of infection.  Your sexual activity has changed since you were last screened and you are at an increased risk for chlamydia or gonorrhea. Ask your health care provider if you are at risk.  If you do not have HIV, but are at risk, it may be recommended that you take a prescription medicine daily to prevent HIV infection. This is called pre-exposure prophylaxis (PrEP). You are considered at risk if:  You are sexually active and do not regularly use condoms or know  the HIV status of your partner(s).  You take drugs by injection.  You are sexually active with a partner who has HIV. Talk with your health care provider about whether you are at high risk of being infected with HIV. If you choose to begin PrEP, you should first be tested for HIV. You should then be tested every 3 months   months for as long as you are taking PrEP.  PREGNANCY   If you are premenopausal and you may become pregnant, ask your health care provider about preconception counseling.  If you may become pregnant, take 400 to 800 micrograms (mcg) of folic acid every day.  If you want to prevent pregnancy, talk to your health care provider about birth control (contraception). OSTEOPOROSIS AND MENOPAUSE   Osteoporosis is a disease in which the bones lose minerals and strength with aging. This can result in serious bone fractures. Your risk for osteoporosis can be identified using a bone density scan.  If you are 79 years of age or older, or if you are at risk for osteoporosis and fractures, ask your health care provider if you should be screened.  Ask your health care provider whether you should take a calcium or vitamin D supplement to lower your risk for osteoporosis.  Menopause may have certain physical symptoms and risks.  Hormone replacement therapy may reduce some of these symptoms and risks. Talk to your health care provider about whether hormone replacement therapy is right for you.  HOME CARE INSTRUCTIONS   Schedule regular health, dental, and eye exams.  Stay current with your immunizations.   Do not use any tobacco products including cigarettes, chewing tobacco, or electronic cigarettes.  If you are pregnant, do not drink alcohol.  If you are breastfeeding, limit how much and how often you drink alcohol.  Limit alcohol intake to no more than 1 drink per day for nonpregnant women. One drink equals 12 ounces of beer, 5 ounces of wine, or 1 ounces of hard liquor.  Do not  use street drugs.  Do not share needles.  Ask your health care provider for help if you need support or information about quitting drugs.  Tell your health care provider if you often feel depressed.  Tell your health care provider if you have ever been abused or do not feel safe at home. Document Released: 06/26/2011 Document Revised: 04/27/2014 Document Reviewed: 11/12/2013 [facility] Patient Information 2015 [place], [place]. This information is not intended to replace advice given to you by your health care provider. Make sure you discuss any questions you have with your health care provider.

## 2014-09-30 NOTE — Assessment & Plan Note (Addendum)
BP stable 101/65. HR 70 -Continue toprol xl 50mg , hyzaar, and imdur

## 2014-09-30 NOTE — Assessment & Plan Note (Signed)
Mammogram information given.  Patient to schedule appointment Flu shot administered today. Recommendations for Prevnar vaccine at 41 discussed Patient to schedule annual physical exam.

## 2014-09-30 NOTE — Assessment & Plan Note (Addendum)
Patient seen by Dr Chalmers Cater.  She has an appointment 10/26/14 for diabetes and thyroid management. FBG 170's with CBGs into the 400's at times.  No concerns for lows.  No symptoms of hyper/hypoglycemia. -Patient to continue current therapies -Continue monitoring CBG -Foot exam to be performed at annual physical  -Eye exam 11/30/14 with Cale.

## 2014-09-30 NOTE — Progress Notes (Signed)
Patient ID: [name], female   DOB: [birth year], 63 y.o.   MRN: [id number]    Subjective: [id number] up  HPI: Patient is a 63 y.o. female presenting to clinic today for follow up. Concerns today include:  1. T1DM: Patient seen by Dr Chalmers Cater for management of medications.  Next appointment scheduled for 10/26/14.  Patient reports FBG 170's and CBGs ranging into the 400's at times.  Denies hypoglycemic symptoms.  Denies ulcers/lesions/numbness/tingling in the lower extremities.  No dysuria, hematuria, foul odors of urine. 2. HTN: Compliant with current regimen.  Does not monitor BP at home.  No complaints or concerns.  Denies CP, SOB, edema, changes in vision or headache. 3. Breast health Patient would like to be scheduled for a mammogram.  She denies any breast complaints or concerns for neoplasm at this time. 4. Social: Patient reports that she has been very busy.  She is in the midst of planning an adoption of a little girl she has been taking care of for a while.   History Reviewed: non smoker. Health Maintenance: Mammogram info and flu shot administered today  ROS: All other systems reviewed and are negative.  Objective: Office vital signs reviewed. BP 101/65  Pulse 70  Temp(Src) 97.8 F (36.6 C) (Oral)  Ht 5\' 7"  (1.702 m)  Wt 255 lb (115.667 kg)  BMI 39.93 kg/m2  Physical Examination:  General: Awake, alert,  obese, NAD, accompanied by soon to be adopted daughter. Head: Atraumatic, normocephalic Cardio: RRR, Y4I3 heard, no murmurs appreciated Pulm: CTAB, no wheezes, rhonchi or rales MSK: antalgic gait, uses cane and normal station  Assessment: 63 y.o. female  1. T1DM 2. HTN 3. Coordination of care  Plan: See Problem List and After Visit Summary  **More than 25 minutes was spent in the coordination of care for this patient.  [name] Norlander, DO

## 2014-10-13 ENCOUNTER — Encounter: Payer: Self-pay | Admitting: *Deleted

## 2014-10-13 ENCOUNTER — Emergency Department (HOSPITAL_COMMUNITY)
Admission: EM | Admit: 2014-10-13 | Discharge: 2014-10-13 | Disposition: A | Discharge status: Home / Self Care | Payer: BC Managed Care – PPO | Attending: Emergency Medicine | Admitting: Emergency Medicine

## 2014-10-13 ENCOUNTER — Encounter (HOSPITAL_COMMUNITY): Payer: Self-pay | Admitting: Emergency Medicine

## 2014-10-13 ENCOUNTER — Emergency Department (HOSPITAL_COMMUNITY): Payer: BC Managed Care – PPO

## 2014-10-13 DIAGNOSIS — M778 Other enthesopathies, not elsewhere classified: Secondary | ICD-10-CM

## 2014-10-13 DIAGNOSIS — E039 Hypothyroidism, unspecified: Secondary | ICD-10-CM | POA: Diagnosis not present

## 2014-10-13 DIAGNOSIS — Z862 Personal history of diseases of the blood and blood-forming organs and certain disorders involving the immune mechanism: Secondary | ICD-10-CM | POA: Diagnosis not present

## 2014-10-13 DIAGNOSIS — Z85850 Personal history of malignant neoplasm of thyroid: Secondary | ICD-10-CM | POA: Insufficient documentation

## 2014-10-13 DIAGNOSIS — E785 Hyperlipidemia, unspecified: Secondary | ICD-10-CM | POA: Diagnosis not present

## 2014-10-13 DIAGNOSIS — Z791 Long term (current) use of non-steroidal anti-inflammatories (NSAID): Secondary | ICD-10-CM | POA: Insufficient documentation

## 2014-10-13 DIAGNOSIS — Z79899 Other long term (current) drug therapy: Secondary | ICD-10-CM | POA: Diagnosis not present

## 2014-10-13 DIAGNOSIS — E109 Type 1 diabetes mellitus without complications: Secondary | ICD-10-CM | POA: Insufficient documentation

## 2014-10-13 DIAGNOSIS — Z8709 Personal history of other diseases of the respiratory system: Secondary | ICD-10-CM | POA: Insufficient documentation

## 2014-10-13 DIAGNOSIS — M1990 Unspecified osteoarthritis, unspecified site: Secondary | ICD-10-CM | POA: Diagnosis not present

## 2014-10-13 DIAGNOSIS — M65811 Other synovitis and tenosynovitis, right shoulder: Secondary | ICD-10-CM | POA: Insufficient documentation

## 2014-10-13 DIAGNOSIS — Z7982 Long term (current) use of aspirin: Secondary | ICD-10-CM | POA: Insufficient documentation

## 2014-10-13 DIAGNOSIS — M79601 Pain in right arm: Secondary | ICD-10-CM | POA: Diagnosis present

## 2014-10-13 DIAGNOSIS — I10 Essential (primary) hypertension: Secondary | ICD-10-CM | POA: Insufficient documentation

## 2014-10-13 DIAGNOSIS — Z794 Long term (current) use of insulin: Secondary | ICD-10-CM | POA: Insufficient documentation

## 2014-10-13 DIAGNOSIS — Z8659 Personal history of other mental and behavioral disorders: Secondary | ICD-10-CM | POA: Insufficient documentation

## 2014-10-13 DIAGNOSIS — I509 Heart failure, unspecified: Secondary | ICD-10-CM | POA: Diagnosis not present

## 2014-10-13 DIAGNOSIS — K219 Gastro-esophageal reflux disease without esophagitis: Secondary | ICD-10-CM | POA: Diagnosis not present

## 2014-10-13 DIAGNOSIS — M7581 Other shoulder lesions, right shoulder: Secondary | ICD-10-CM

## 2014-10-13 MED ORDER — HYDROCODONE-ACETAMINOPHEN 5-325 MG PO TABS: 2.0000 | ORAL_TABLET | ORAL | Status: DC | PRN

## 2014-10-13 NOTE — ED Provider Notes (Signed)
CSN: [id number]     Arrival date & time 10/13/14  7893 History   First MD Initiated Contact with Patient 10/13/14 [phone]     Chief Complaint  Patient presents with  . Arm Pain     (Consider location/radiation/quality/duration/timing/severity/associated sxs/prior Treatment) Patient is a 63 y.o. female presenting with arm pain. The history is provided by the patient. No language interpreter was used.  Arm Pain This is a new problem. The current episode started today. The problem occurs constantly. The problem has been gradually worsening. Associated symptoms include myalgias. Pertinent negatives include no joint swelling or neck pain. The symptoms are aggravated by bending. She has tried nothing for the symptoms. The treatment provided no relief.    Past Medical History  Diagnosis Date  . Thyroid cancer     per pt S/p Total Thyroidectomy with Radioactive Iodine Therapy  . Hyperlipidemia   . Hypothyroidism   . Hypertension   . GERD (gastroesophageal reflux disease)   . Seasonal allergies   . Type 1 diabetes mellitus   . Hemorrhoids   . Depression   . Anemia   . Arthritis   . CHF (congestive heart failure)    Past Surgical History  Procedure Laterality Date  . Thyroidectomy  2006  . Abdominal hysterectomy  1995    partial  . Cardiac catheterization      with coronary angiogram  . Ankle fracture surgery Right   . Mini meter   09/08/2014    ELECTRONIC INSULIN PUMP  . Colonoscopy N/A 09/09/2014    Procedure: COLONOSCOPY;  Surgeon: Gatha Mayer, MD;  Location: [facility];  Service: Endoscopy;  Laterality: N/A;   Family History  Problem Relation Age of Onset  . Heart disease Mother     MI  . Hyperlipidemia Mother   . Hypertension Mother   . Early death Father     Head Injury at Work  . Hyperlipidemia Father   . Hypertension Father   . Diabetes Maternal Aunt   . Prostate cancer Paternal Uncle   . Heart disease Maternal Aunt   . Renal Disease Mother     insufficiency    History  Substance Use Topics  . Smoking status: Never Smoker   . Smokeless tobacco: Never Used  . Alcohol Use: No   OB History   Grav Para Term Preterm Abortions TAB SAB Ect Mult Living                 Review of Systems  Musculoskeletal: Positive for myalgias. Negative for joint swelling and neck pain.  Skin: Negative for color change.  All other systems reviewed and are negative.     Allergies  Review of patient's allergies indicates no known allergies.  Home Medications   Prior to Admission medications   Medication Sig Start Date End Date Taking? Authorizing Provider  Acetaminophen 650 MG TABS Take 1,300 mg by mouth daily as needed (for pain).   Yes Historical Provider, MD  aspirin EC 81 MG tablet Take 1 tablet (81 mg total) by mouth daily. 07/10/14  Yes Ashly Windell Moulding, DO  atorvastatin (LIPITOR) 20 MG tablet Take 1 tablet (20 mg total) by mouth daily. 06/19/14  Yes Gerda Diss, DO  Canagliflozin (INVOKANA) 100 MG TABS Take 1 tablet (100 mg total) by mouth daily. 04/21/14  Yes Gerda Diss, DO  Insulin Human (INSULIN PUMP) 100 unit/ml SOLN Inject 1 each into the skin continuous. Uses novolog insulin in her pump.   Yes Historical  Provider, MD  isosorbide mononitrate (IMDUR) 30 MG 24 hr tablet Take 1 tablet (30 mg total) by mouth daily. 06/19/14  Yes Gerda Diss, DO  levothyroxine (SYNTHROID, LEVOTHROID) 150 MCG tablet Take 1 tablet (150 mcg total) by mouth daily before breakfast. 06/19/14  Yes Gerda Diss, DO  losartan-hydrochlorothiazide (HYZAAR) 50-12.5 MG per tablet Take 1 tablet by mouth daily.   Yes Historical Provider, MD  meloxicam (MOBIC) 7.5 MG tablet Take 7.5 mg by mouth daily as needed for pain.   Yes Historical Provider, MD  metoprolol succinate (TOPROL-XL) 50 MG 24 hr tablet Take 1 tablet (50 mg total) by mouth daily. Take with or immediately following a meal. 09/14/14  Yes Ashly M Gottschalk, DO  pantoprazole (PROTONIX) 40 MG tablet Take 1 tablet  (40 mg total) by mouth daily. 06/19/14  Yes Gerda Diss, DO  POTASSIUM PO Take 1 tablet by mouth daily as needed (for leg cramps). OTC   Yes Historical Provider, MD   BP 135/43  Pulse 49  Temp(Src) 98.5 F (36.9 C) (Oral)  Resp 20  SpO2 100% Physical Exam  Nursing note and vitals reviewed. Constitutional: She appears well-developed and well-nourished.  HENT:  Head: Normocephalic.  Eyes: Pupils are equal, round, and reactive to light.  Neck: Normal range of motion.  Cardiovascular: Normal rate.   Pulmonary/Chest: Effort normal.  Musculoskeletal: She exhibits tenderness.  Neurological: She is alert.  Skin: Skin is warm.    ED Course  Procedures (including critical care time) Labs Review Labs Reviewed - No data to display  Imaging Review Dg Shoulder Right  10/13/2014   CLINICAL DATA:  Right shoulder pain since Saturday. No known injury.  EXAM: RIGHT SHOULDER - 2+ VIEW  COMPARISON:  None.  FINDINGS: The joint spaces are maintained. No acute bony findings. No abnormal soft tissue calcifications. The visualized right lung is grossly clear.  IMPRESSION: No acute bony findings or significant degenerative changes.   Electronically Signed   By: Kalman Jewels M.D.   On: 10/13/2014 10:58     EKG Interpretation   Date/Time:  Tuesday October 13 2014 09:14:01 EDT Ventricular Rate:  60 PR Interval:  142 QRS Duration: 84 QT Interval:  478 QTC Calculation: 478 R Axis:   59 Text Interpretation:  Normal sinus rhythm Normal ECG Since last tracing 06 Oct 2013 Premature atrial complexes NO LONGER PRESENT Confirmed by KNAPP   MD-I, IVA ([id number]) on 10/13/2014 9:21:36 AM      MDM I suspect pt has a rotator cuff tendonitis.  Pt is followed by [facility] practice.   I advised scheduling appointment to see one of the sports medicine MD's there for evaluation  Pt advised to continue meloxicam.    I will give hydrocodone for acute pain   Final diagnoses:  Tendonitis of shoulder, right      AVS   Fransico Meadow, PA-C 10/13/14 1145

## 2014-10-13 NOTE — ED Notes (Signed)
Patient transported to X-ray 

## 2014-10-13 NOTE — ED Notes (Signed)
Pt in c/o right arm pain or the last several days, states she was seen at family practice for this and started on meloxicam and that has been helping pain at home, pt in due to recurrence of symptoms, pain is in right shoulder and at times in right elbow, unknown specific injury

## 2014-10-13 NOTE — Progress Notes (Signed)
   Pt in nurse clinic for triage of right arm pain.  Pt stated she has been hurting x 3 days.  Pt denies any chest pain, SOB, numbness/tingling in the arm arm.  [facility] does not have any open appt this AM, only one open this afternoon.  Pt advised if she did not want to wait until this afternoon she can go to the ED or urgent care.  Pt opt to go to ED this AM.  Derl Barrow, RN

## 2014-10-13 NOTE — Discharge Instructions (Signed)
Tendinitis °Tendinitis is swelling and inflammation of the tendons. Tendons are band-like tissues that connect muscle to bone. Tendinitis commonly occurs in the:  °· Shoulders (rotator cuff). °· Heels (Achilles tendon). °· Elbows (triceps tendon). °CAUSES °Tendinitis is usually caused by overusing the tendon, muscles, and joints involved. When the tissue surrounding a tendon (synovium) becomes inflamed, it is called tenosynovitis. Tendinitis commonly develops in people whose jobs require repetitive motions. °SYMPTOMS °· Pain. °· Tenderness. °· Mild swelling. °DIAGNOSIS °Tendinitis is usually diagnosed by physical exam. Your health care provider may also order X-rays or other imaging tests. °TREATMENT °Your health care provider may recommend certain medicines or exercises for your treatment. °HOME CARE INSTRUCTIONS  °· Use a sling or splint for as long as directed by your health care provider until the pain decreases. °· Put ice on the injured area. °¨ Put ice in a plastic bag. °¨ Place a towel between your skin and the bag. °¨ Leave the ice on for 15-20 minutes, 3-4 times a day, or as directed by your health care provider. °· Avoid using the limb while the tendon is painful. Perform gentle range of motion exercises only as directed by your health care provider. Stop exercises if pain or discomfort increase, unless directed otherwise by your health care provider. °· Only take over-the-counter or prescription medicines for pain, discomfort, or fever as directed by your health care provider. °SEEK MEDICAL CARE IF:  °· Your pain and swelling increase. °· You develop new, unexplained symptoms, especially increased numbness in the hands. °MAKE SURE YOU:  °· Understand these instructions. °· Will watch your condition. °· Will get help right away if you are not doing well or get worse. °Document Released: 12/08/2000 Document Revised: 04/27/2014 Document Reviewed: 02/27/2011 °ExitCare® Patient Information ©2015 [employer],  LLC. This information is not intended to replace advice given to you by your health care provider. Make sure you discuss any questions you have with your health care provider. ° °

## 2014-10-13 NOTE — ED Notes (Signed)
Patient returned from X-ray 

## 2014-10-13 NOTE — ED Provider Notes (Signed)
Medical screening examination/treatment/procedure(s) were performed by non-physician practitioner and as supervising physician I was immediately available for consultation/collaboration.   EKG Interpretation   Date/Time:  Tuesday October 13 2014 09:14:01 EDT Ventricular Rate:  60 PR Interval:  142 QRS Duration: 84 QT Interval:  478 QTC Calculation: 478 R Axis:   59 Text Interpretation:  Normal sinus rhythm Normal ECG Since last tracing 06 Oct 2013 Premature atrial complexes NO LONGER PRESENT Confirmed by [place]   MD-I, Miela Desjardin ([id number]) on 10/13/2014 9:21:36 AM      Rolland Porter, MD, Alanson Aly, MD 10/13/14 1153

## 2014-10-16 ENCOUNTER — Ambulatory Visit
Admission: RE | Admit: 2014-10-16 | Discharge: 2014-10-16 | Disposition: A | Discharge status: Home / Self Care | Payer: BC Managed Care – PPO | Source: Ambulatory Visit

## 2014-10-16 ENCOUNTER — Encounter: Payer: Self-pay | Admitting: Family Medicine

## 2014-10-16 DIAGNOSIS — Z1231 Encounter for screening mammogram for malignant neoplasm of breast: Secondary | ICD-10-CM

## 2014-10-19 ENCOUNTER — Telehealth: Payer: Self-pay | Admitting: *Deleted

## 2014-10-19 ENCOUNTER — Other Ambulatory Visit: Payer: Self-pay | Admitting: Sports Medicine

## 2014-10-19 NOTE — Telephone Encounter (Signed)
Message from [facility] to Janora Norlander, DO sent at 10/16/2014 8:07 PM ----- hi Dr Lajuana Ripple I called invokana .they said I can get a prescription from you. I an insured for the rest of the year and next year they are going to renew me to get invokana free. [facility] is sending a request for a new prescription. I also got my mammogram done.

## 2014-10-19 NOTE — Telephone Encounter (Signed)
I will refill x1, but patient see Dr Chalmers Cater.  She told me last visit she has an appointment 11/2.  Please relay to patient that Dr Chalmers Cater should be refilling future rx for this.

## 2014-10-19 NOTE — Telephone Encounter (Signed)
Pt called and was checking when the doctor was going to write the prescription for her Invokana so that she can get this. jw

## 2014-10-21 ENCOUNTER — Encounter: Payer: Self-pay | Admitting: Family Medicine

## 2014-10-21 ENCOUNTER — Other Ambulatory Visit: Payer: Self-pay | Admitting: *Deleted

## 2014-10-21 NOTE — Telephone Encounter (Signed)
I will refill it this month, but again, future refills for this medication should go to patient's endocrinologist Dr Chalmers Cater.

## 2014-10-21 NOTE — Telephone Encounter (Signed)
Patient informed and will call back for appointment for future refills

## 2014-11-02 ENCOUNTER — Other Ambulatory Visit: Payer: Self-pay | Admitting: Family Medicine

## 2014-11-02 MED ORDER — CANAGLIFLOZIN 100 MG PO TABS: ORAL_TABLET | ORAL | Status: DC

## 2014-11-02 NOTE — Telephone Encounter (Signed)
Needs refill invokana  Has appt dec 2

## 2014-11-02 NOTE — Telephone Encounter (Signed)
LVM for patient to call back, rx was sent in on 10/19/2014

## 2014-11-02 NOTE — Telephone Encounter (Signed)
This was sent in on 10/26.  Can you verify if this patient already has this?

## 2014-11-09 ENCOUNTER — Other Ambulatory Visit: Payer: Self-pay | Admitting: Family Medicine

## 2014-11-09 ENCOUNTER — Encounter: Payer: Self-pay | Admitting: Family Medicine

## 2014-11-09 ENCOUNTER — Telehealth: Payer: Self-pay | Admitting: *Deleted

## 2014-11-09 NOTE — Telephone Encounter (Signed)
Ok.  Thanks.  Updated her MAR with regards to [place].  I will add phentermine to [facility] at next appointment.

## 2014-11-09 NOTE — Telephone Encounter (Signed)
I cannot make the Dec 14 appt. Dr Chalmers Cater has taken me off invokana.     I will have to reschedule. she has me on phentermine 15 mg.

## 2014-11-18 ENCOUNTER — Other Ambulatory Visit: Payer: Self-pay | Admitting: Family Medicine

## 2014-11-18 NOTE — Telephone Encounter (Signed)
Called patient regarding need for Mobic.  She states that she has ongoing R shoulder pain that Dr Lacinda Axon gave her this medication to help with in July in efforts to avoid using medications like Hydrocodone.  She states that she takes it maybe twice weekly to help with pain, with good relief.   Denies abdominal pain/ reflux/ nausea or vomiting.  Denies weakness or sensation changes in extremity.  Will RF x2. Patient to call on Monday to schedule appointment for physical and mole removal.  Will obtain annual labs at that point.  Last Cr (12/2012) 0.91  Quinlynn Cuthbert M. Lajuana Ripple, DO

## 2014-12-03 ENCOUNTER — Encounter (HOSPITAL_COMMUNITY): Payer: Self-pay | Admitting: Cardiology

## 2014-12-07 ENCOUNTER — Ambulatory Visit: Payer: BC Managed Care – PPO | Admitting: Family Medicine

## 2014-12-18 ENCOUNTER — Other Ambulatory Visit: Payer: Self-pay | Admitting: Sports Medicine

## 2014-12-21 ENCOUNTER — Other Ambulatory Visit: Payer: Self-pay | Admitting: Family Medicine

## 2014-12-21 ENCOUNTER — Encounter: Payer: Self-pay | Admitting: Family Medicine

## 2014-12-21 MED ORDER — LEVOTHYROXINE SODIUM 150 MCG PO TABS: 150.0000 ug | ORAL_TABLET | Freq: Every day | ORAL | Status: DC

## 2014-12-21 NOTE — Telephone Encounter (Signed)
Pt called and needs a refill on her Thyroid medication called in. She needs all medications called in at 90 day supply. jw

## 2014-12-21 NOTE — Telephone Encounter (Signed)
Needs labs. Please have her schedule an appointment for this. Refill x1

## 2014-12-21 NOTE — Telephone Encounter (Signed)
Spoke with patient and informed her that rx has been sent in with a 90 day supply. She was also informed of the message below

## 2014-12-22 ENCOUNTER — Other Ambulatory Visit: Payer: Self-pay | Admitting: Sports Medicine

## 2014-12-23 ENCOUNTER — Encounter: Payer: Self-pay | Admitting: Family Medicine

## 2014-12-23 ENCOUNTER — Telehealth: Payer: Self-pay | Admitting: Family Medicine

## 2014-12-23 NOTE — Telephone Encounter (Signed)
Pt called because she is now using [facility] and all medications have to be in 90 day qty She needs a refill on her Lipitor and she would like the doctor to look at her other medications to see if they are coming up on renewal. jw

## 2014-12-25 NOTE — Telephone Encounter (Signed)
Refilled Statin, thought that PCP could look over other meds after returning from vacation

## 2014-12-29 NOTE — Telephone Encounter (Signed)
Called patient to schedule an appointment, as she had to cancel her December one with me.  I would really like to have labs, TSH, drawn before refilling all of her medications.  I was only able to get a voicemail and instructed her to call back for an appt with me this month.  Will refill meds x90 day supplies once she has an appointment with me.  Ashly M. Lajuana Ripple, DO PGY-1, [place]

## 2014-12-31 ENCOUNTER — Encounter: Payer: Self-pay | Admitting: Family Medicine

## 2014-12-31 ENCOUNTER — Telehealth: Payer: Self-pay | Admitting: *Deleted

## 2014-12-31 NOTE — Telephone Encounter (Signed)
Hi doc,     I got your message.    I have an appointment with [name] ,Feb. 2,2016.  want to get with you after that date.

## 2015-01-01 NOTE — Telephone Encounter (Signed)
Sounds great.  I will hold off on any 90 day refills until that time, just in case we need to make any adjustments to your medications.  In the meantime, if you run out of anything please let me know and I will make sure you have enough medication until we see each other. Please relay to patient.

## 2015-01-01 NOTE — Telephone Encounter (Signed)
Informed pt of the message below. [name], April D

## 2015-02-02 ENCOUNTER — Encounter: Payer: Self-pay | Admitting: Family Medicine

## 2015-02-02 ENCOUNTER — Other Ambulatory Visit: Payer: Self-pay | Admitting: Family Medicine

## 2015-02-02 DIAGNOSIS — M549 Dorsalgia, unspecified: Secondary | ICD-10-CM

## 2015-02-02 MED ORDER — MELOXICAM 7.5 MG PO TABS: 7.5000 mg | ORAL_TABLET | Freq: Every day | ORAL | Status: DC | PRN

## 2015-02-02 NOTE — Telephone Encounter (Signed)
Done.  Meloxicam reordered w/ 1 RF.  Ashly M. Lajuana Ripple, DO PGY-1, [place]

## 2015-02-22 ENCOUNTER — Ambulatory Visit (INDEPENDENT_AMBULATORY_CARE_PROVIDER_SITE_OTHER): Payer: BC Managed Care – PPO | Admitting: Family Medicine

## 2015-02-22 ENCOUNTER — Encounter: Payer: Self-pay | Admitting: Family Medicine

## 2015-02-22 VITALS — BP 130/74 | HR 80 | Temp 98.3°F | Ht 67.0 in | Wt 251.6 lb

## 2015-02-22 DIAGNOSIS — E109 Type 1 diabetes mellitus without complications: Secondary | ICD-10-CM | POA: Diagnosis not present

## 2015-02-22 DIAGNOSIS — E890 Postprocedural hypothyroidism: Secondary | ICD-10-CM | POA: Diagnosis not present

## 2015-02-22 DIAGNOSIS — I10 Essential (primary) hypertension: Secondary | ICD-10-CM | POA: Diagnosis not present

## 2015-02-22 MED ORDER — LOSARTAN POTASSIUM-HCTZ 50-12.5 MG PO TABS: 1.0000 | ORAL_TABLET | Freq: Every day | ORAL | Status: DC

## 2015-02-22 MED ORDER — LEVOTHYROXINE SODIUM 150 MCG PO TABS: 150.0000 ug | ORAL_TABLET | Freq: Every day | ORAL | Status: DC

## 2015-02-22 NOTE — Assessment & Plan Note (Signed)
Patient tolerating adjustments to basal insulin by Dr Chalmers Cater well -DM foot exam performed today.  Callus formation on R great toe.   -Discussed with patient good DM foot care -Will RF insulin x90 supply -Patient to bring in labs from recent appointment -Will obtain A1c at 3 month follow up.

## 2015-02-22 NOTE — Assessment & Plan Note (Signed)
TSH obtained at appointment with Dr Chalmers Cater -Will await these results -RF Synthroid

## 2015-02-22 NOTE — Assessment & Plan Note (Signed)
Patient continuing to work on lifestyle modifications, with a 4 lb weight loss since last visit -Encouraged continued modifications -Patient also scheduled to see dr Jenne Campus soon

## 2015-02-22 NOTE — Progress Notes (Signed)
Patient ID: [name], female   DOB: 1951/05/03, 63 y.o.   MRN: [id number]    Subjective: CC: DM, Obesity HPI: Patient is a 64 y.o. female presenting to clinic today for follow up. Concerns today include:  1. T1DM Patient reports that she saw her endocrinologist Dr Chalmers Cater last month, who adjusted her basal insulin on her insulin pump.  She states that She has been doing better since this adjustment.  She reports less hypoglycemic episodes.  Denies recent symptoms of N/V, abdominal pain, lightheadedness, sweats.  Patient continues to work on lifestyle modifications, walking every day for exercise and being more conscious of carbohydrate intake.  A1c obtained last month at endo, was 8-9.  Patient cannot recall exactly but states that it "wasn't good".  2. Hypothyroidism Patient reports that Dr Chalmers Cater also follows her thyroid.  She states that TSH was also obtained last month.  She will bring lab results into next appointment.  She denies palpations, diarrhea, constipation, excessive energy or fatigue.  She does note difficulty with weight loss.  For this reason Dr Chalmers Cater has started her on a low dose weight loss pill.  Patient is cautious in monitoring her BP in the setting of this medication.  Patient is compliant with synthroid.  3. Obesity Exercise and diet as above.  Patient continues to work on weight loss.  Continuing to take Phentermine at a low dose, not every day.  Denies excessive thirst, jitteriness, or headache.   Social History Reviewed: non smoker. FamHx and MedHx updated.  Please see EMR. Health Maintenance: will perform foot exam today  ROS: All other systems reviewed and are negative.  Objective: Office vital signs reviewed. BP 130/74 mmHg  Pulse 80  Temp(Src) 98.3 F (36.8 C) (Oral)  Ht 5\' 7"  (1.702 m)  Wt 251 lb 9.6 oz (114.125 kg)  BMI 39.40 kg/m2  Physical Examination:  General: Awake, alert, well nourished, well appearing female, NAD Cardio: RRR, S1S2  heard, no murmurs appreciated Pulm: CTAB, no wheezes, rhonchi or rales Extremities: WWP, No edema, cyanosis or clubbing; +2 pulses bilaterally MSK: Normal gait and station Skin: dry, intact, see foot exam Neuro: Strength and sensation grossly intact  Please see EMR for detailed DM foot exam.  Assessment: 64 y.o. female with T1DM, hypothyroidism, obesity  Plan: See Problem List and After Visit Summary   [name] Norlander, DO PGY-1, [place]

## 2015-02-22 NOTE — Patient Instructions (Signed)
It was a pleasure seeing you today, [name]!  Information regarding what we discussed is included in this packet.  Please make an appointment to see me in 3 months.  I will send your prescriptions in as we discussed.  Continue working on diet and exercise.    Please feel free to call our office at [phone] if any questions or concerns arise.  Warm Regards, Shell Blanchette M. Symantha Steeber, DO   Diabetes Mellitus and Food It is important for you to manage your blood sugar (glucose) level. Your blood glucose level can be greatly affected by what you eat. Eating healthier foods in the appropriate amounts throughout the day at about the same time each day will help you control your blood glucose level. It can also help slow or prevent worsening of your diabetes mellitus. Healthy eating may even help you improve the level of your blood pressure and reach or maintain a healthy weight.  HOW CAN FOOD AFFECT ME? Carbohydrates Carbohydrates affect your blood glucose level more than any other type of food. Your dietitian will help you determine how many carbohydrates to eat at each meal and teach you how to count carbohydrates. Counting carbohydrates is important to keep your blood glucose at a healthy level, especially if you are using insulin or taking certain medicines for diabetes mellitus. Alcohol Alcohol can cause sudden decreases in blood glucose (hypoglycemia), especially if you use insulin or take certain medicines for diabetes mellitus. Hypoglycemia can be a life-threatening condition. Symptoms of hypoglycemia (sleepiness, dizziness, and disorientation) are similar to symptoms of having too much alcohol.  If your health care provider has given you approval to drink alcohol, do so in moderation and use the following guidelines:  Women should not have more than one drink per day, and men should not have more than two drinks per day. One drink is equal to:  12 oz of beer.  5 oz of wine.  1 oz of hard  liquor.  Do not drink on an empty stomach.  Keep yourself hydrated. Have water, diet soda, or unsweetened iced tea.  Regular soda, juice, and other mixers might contain a lot of carbohydrates and should be counted. WHAT FOODS ARE NOT RECOMMENDED? As you make food choices, it is important to remember that all foods are not the same. Some foods have fewer nutrients per serving than other foods, even though they might have the same number of calories or carbohydrates. It is difficult to get your body what it needs when you eat foods with fewer nutrients. Examples of foods that you should avoid that are high in calories and carbohydrates but low in nutrients include:  Trans fats (most processed foods list trans fats on the Nutrition Facts label).  Regular soda.  Juice.  Candy.  Sweets, such as cake, pie, doughnuts, and cookies.  Fried foods. WHAT FOODS CAN I EAT? Have nutrient-rich foods, which will nourish your body and keep you healthy. The food you should eat also will depend on several factors, including:  The calories you need.  The medicines you take.  Your weight.  Your blood glucose level.  Your blood pressure level.  Your cholesterol level. You also should eat a variety of foods, including:  Protein, such as meat, poultry, fish, tofu, nuts, and seeds (lean animal proteins are best).  Fruits.  Vegetables.  Dairy products, such as milk, cheese, and yogurt (low fat is best).  Breads, grains, pasta, cereal, rice, and beans.  Fats such as olive oil,  trans fat-free margarine, canola oil, avocado, and olives. DOES EVERYONE WITH DIABETES MELLITUS HAVE THE SAME MEAL PLAN? Because every person with diabetes mellitus is different, there is not one meal plan that works for everyone. It is very important that you meet with a dietitian who will help you create a meal plan that is just right for you. Document Released: 09/07/2005 Document Revised: 12/16/2013 Document Reviewed:  11/07/2013 [facility] Patient Information 2015 [place], [place]. This information is not intended to replace advice given to you by your health care provider. Make sure you discuss any questions you have with your health care provider.

## 2015-02-23 ENCOUNTER — Other Ambulatory Visit: Payer: Self-pay | Admitting: Family Medicine

## 2015-02-23 ENCOUNTER — Ambulatory Visit (INDEPENDENT_AMBULATORY_CARE_PROVIDER_SITE_OTHER): Payer: BC Managed Care – PPO | Admitting: Family Medicine

## 2015-02-23 ENCOUNTER — Encounter: Payer: Self-pay | Admitting: Family Medicine

## 2015-02-23 VITALS — Ht 67.0 in | Wt 253.2 lb

## 2015-02-23 DIAGNOSIS — E109 Type 1 diabetes mellitus without complications: Secondary | ICD-10-CM

## 2015-02-23 NOTE — Patient Instructions (Addendum)
-   One serving of McDonald's oatmeal with brown sugar has 58 grams of carb and 32 grams of sugar.    - If you add your own sugar, each level teaspoon = 4 grams of carb.   - McDonald's Filet 'O Fish:  390 cal, 39 g carb, 19 g fat, 15 g protein  - If you are going to eat restaurant food, go online, and find out how many carb's are in the food choices:  https://www.lee.info/.    - Refer to the Diabetic Exchange Lists provided today to know how many carb's are in which foods AND to know what represents ONE portion (exchange) of a carb food.    - Google Diabetic Exchange Lists for further information, or Google American Diabetic Association.    - Email [email or web address] with any questions.    Recommendations: 1. Limit starchy foods to TWO exchanges per meal (15 g of carb each) and a total of 45 grams of carb per meal.    - This means you can have 2 starch exchanges, plus some fruit or milk foods.   2. Limit snacks to no more than 30 grams of carb.   - Program your pump accordingly.   3. I would like to see you wean yourself off of all sweet drinks!  Keep in mind that TASTE PREFERENCES ARE LEARNED.  This means that it will get easier to choose foods you know are good for you if you are exposed to them enough.   4. Consider asking [name] or Dr. Lajuana Ripple for a referral for classes at the [facility].

## 2015-02-23 NOTE — Progress Notes (Signed)
Medical Nutrition Therapy:  Appt start time: 1430 end time:  8527.  Assessment:  Primary concerns today: Weight management and Blood sugar control.   Learning Readiness: Ready; [name] wants to learn to count carb's so she can appropriately program her [employer] insulin pump.   [name] lives with her sister and her granddaughter, and she is the main Scientist, product/process development.  She was diagnosed as DM2 at age 64, then changed the dx to DM1 two yrs later.  She started using a pump ~10 yrs ago, but this new pump is the first that asks for meal time carb's.  In the >20 yrs since diagnosis, [name] has never been referred to an RD for DM or diet education.  She was very eager to learn, and I feel she would do great with further DM education at [facility].    Barriers to learning/adherence to lifestyle change: carb counting.    [name] is usually checking her BG before each meal.  FBG have been 112-130.    Insulin (Novolog) dosing started ~1 mo ago: 12 AM:  0.575 u 4:30 AM:  0.775 u 9 AM:   1.150 u 11 AM:  0.875 u 1:30 PM: 1.500 u 3 PM:   0.975 u 6 PM:   1.100 u 7:30 PM:  0.975 u  Usual eating pattern includes 3 meals and 1 snack per day. Frequent foods and beverages include ~12 oz caf-free Coke, 8-12 oz sweet green tea, 1 c coffee with Equal (and ~1 X wk 6 oz fruit drinks if low BG), water all day.  Avoided foods include liver, and most fried foods.  [name] has told her to not eat foods higher than 10% sugar (% of DV on label).   Usual physical activity includes 40 min walking 5-6 X wk.    24-hr recall: (Up at 6 AM) B (~7 AM)-   1 c coffee w/ swtnr B (8:15)-  McD's oatmeal w/ brown sugar Snk ( AM)-    L (12 PM)-  Captain D's: 5 lg shrimp, 2 oz fish, 1/2 c coleslaw, 1 1/2 hush puppies, 2 or 3 fries, 1 c Lipton green tea Snk ( PM)-   D (4 PM)-  McD Filet 'O Fish, water Snk ( PM)-  3 Lance pb crackers, 6 oz juice drink Typical day? Yes.  Sometimes cooks, but breakfast is usually McD's  oatmeal.  On weeknds, she cooks breakfast of eggs, bacon, and toast.    Progress Towards Goal(s):  In progress.   Nutritional Diagnosis:  NI-5.8.2 Excessive carbohydrate intake As related to beverages.  As evidenced by usual intake of Coke and sweet tea.    Intervention:  Nutrition education.  Handouts given during visit include:  AVS  ADA Diabetic Exchange Lists  Demonstrated degree of understanding via:  Teach Back   Monitoring/Evaluation:  Dietary intake, exercise, BGs, and body weight prn.

## 2015-02-24 ENCOUNTER — Ambulatory Visit: Payer: BC Managed Care – PPO | Admitting: *Deleted

## 2015-02-25 ENCOUNTER — Encounter: Payer: Self-pay | Admitting: Family Medicine

## 2015-03-15 ENCOUNTER — Telehealth: Payer: Self-pay | Admitting: *Deleted

## 2015-03-15 NOTE — Telephone Encounter (Signed)
LMOVM for pt to call us back to schedule an appt with pcp for her diabetes. Alley Neils, CMA  

## 2015-05-12 ENCOUNTER — Other Ambulatory Visit: Payer: Self-pay | Admitting: Family Medicine

## 2015-05-28 ENCOUNTER — Encounter: Payer: Self-pay | Admitting: Family Medicine

## 2015-05-28 ENCOUNTER — Other Ambulatory Visit: Payer: Self-pay | Admitting: Family Medicine

## 2015-05-28 DIAGNOSIS — K219 Gastro-esophageal reflux disease without esophagitis: Secondary | ICD-10-CM

## 2015-05-28 MED ORDER — PANTOPRAZOLE SODIUM 40 MG PO TBEC: 40.0000 mg | DELAYED_RELEASE_TABLET | Freq: Every day | ORAL | Status: DC

## 2015-06-01 ENCOUNTER — Encounter: Payer: Self-pay | Admitting: Family Medicine

## 2015-06-02 ENCOUNTER — Other Ambulatory Visit: Payer: Self-pay | Admitting: Family Medicine

## 2015-06-02 ENCOUNTER — Encounter: Payer: Self-pay | Admitting: Family Medicine

## 2015-06-02 DIAGNOSIS — K219 Gastro-esophageal reflux disease without esophagitis: Secondary | ICD-10-CM

## 2015-06-02 MED ORDER — OMEPRAZOLE 20 MG PO CPDR: 20.0000 mg | DELAYED_RELEASE_CAPSULE | Freq: Every day | ORAL | Status: DC

## 2015-06-03 ENCOUNTER — Other Ambulatory Visit: Payer: Self-pay | Admitting: *Deleted

## 2015-06-03 MED ORDER — ATORVASTATIN CALCIUM 20 MG PO TABS: 20.0000 mg | ORAL_TABLET | Freq: Every day | ORAL | Status: DC

## 2015-06-04 ENCOUNTER — Other Ambulatory Visit: Payer: Self-pay | Admitting: Family Medicine

## 2015-06-04 DIAGNOSIS — E890 Postprocedural hypothyroidism: Secondary | ICD-10-CM

## 2015-06-04 DIAGNOSIS — K219 Gastro-esophageal reflux disease without esophagitis: Secondary | ICD-10-CM

## 2015-06-04 MED ORDER — OMEPRAZOLE 20 MG PO CPDR: 20.0000 mg | DELAYED_RELEASE_CAPSULE | Freq: Every day | ORAL | Status: DC

## 2015-06-04 MED ORDER — LEVOTHYROXINE SODIUM 150 MCG PO TABS: 150.0000 ug | ORAL_TABLET | Freq: Every day | ORAL | Status: DC

## 2015-06-07 ENCOUNTER — Encounter: Payer: Self-pay | Admitting: Family Medicine

## 2015-06-08 ENCOUNTER — Encounter: Payer: Self-pay | Admitting: Family Medicine

## 2015-06-08 ENCOUNTER — Other Ambulatory Visit: Payer: Self-pay | Admitting: Family Medicine

## 2015-06-08 DIAGNOSIS — K219 Gastro-esophageal reflux disease without esophagitis: Secondary | ICD-10-CM

## 2015-06-08 MED ORDER — PANTOPRAZOLE SODIUM 40 MG PO TBEC: 40.0000 mg | DELAYED_RELEASE_TABLET | Freq: Every day | ORAL | Status: DC

## 2015-06-09 NOTE — Telephone Encounter (Signed)
PA for Protonix was completed and faxed to [facility] for review.  Derl Barrow, RN

## 2015-06-10 ENCOUNTER — Encounter: Payer: Self-pay | Admitting: Family Medicine

## 2015-06-10 NOTE — Telephone Encounter (Signed)
PA for Protonix has been approved until 06/09/16.  Left voice message for patient informing to call pharmacy so they can ship her medication out.  Derl Barrow, RN

## 2015-06-15 ENCOUNTER — Other Ambulatory Visit: Payer: Self-pay | Admitting: Sports Medicine

## 2015-06-15 NOTE — Telephone Encounter (Signed)
Is this ok to refill?  

## 2015-06-16 ENCOUNTER — Encounter: Payer: Self-pay | Admitting: Family Medicine

## 2015-06-16 ENCOUNTER — Other Ambulatory Visit: Payer: Self-pay | Admitting: Family Medicine

## 2015-06-16 DIAGNOSIS — I10 Essential (primary) hypertension: Secondary | ICD-10-CM

## 2015-06-16 MED ORDER — ISOSORBIDE MONONITRATE ER 30 MG PO TB24: 30.0000 mg | ORAL_TABLET | Freq: Every day | ORAL | Status: DC

## 2015-06-16 NOTE — Telephone Encounter (Signed)
This is your patient, it came to sports med.

## 2015-07-27 ENCOUNTER — Other Ambulatory Visit: Payer: Self-pay | Admitting: Family Medicine

## 2015-08-02 ENCOUNTER — Other Ambulatory Visit: Payer: Self-pay | Admitting: Family Medicine

## 2015-08-02 ENCOUNTER — Encounter: Payer: Self-pay | Admitting: Family Medicine

## 2015-08-02 DIAGNOSIS — M549 Dorsalgia, unspecified: Secondary | ICD-10-CM

## 2015-08-02 MED ORDER — MELOXICAM 7.5 MG PO TABS: ORAL_TABLET | ORAL | Status: DC

## 2015-08-16 ENCOUNTER — Other Ambulatory Visit: Payer: Self-pay

## 2015-08-17 ENCOUNTER — Other Ambulatory Visit: Payer: Self-pay

## 2015-08-17 ENCOUNTER — Other Ambulatory Visit: Payer: Self-pay | Admitting: Family Medicine

## 2015-08-24 ENCOUNTER — Ambulatory Visit (INDEPENDENT_AMBULATORY_CARE_PROVIDER_SITE_OTHER): Payer: BC Managed Care – PPO | Admitting: Family Medicine

## 2015-08-24 ENCOUNTER — Encounter: Payer: Self-pay | Admitting: Family Medicine

## 2015-08-24 VITALS — BP 129/84 | HR 72 | Temp 98.2°F | Ht 67.0 in | Wt 267.7 lb

## 2015-08-24 DIAGNOSIS — M549 Dorsalgia, unspecified: Secondary | ICD-10-CM | POA: Diagnosis not present

## 2015-08-24 DIAGNOSIS — Z1159 Encounter for screening for other viral diseases: Secondary | ICD-10-CM | POA: Diagnosis not present

## 2015-08-24 DIAGNOSIS — I10 Essential (primary) hypertension: Secondary | ICD-10-CM

## 2015-08-24 LAB — COMPREHENSIVE METABOLIC PANEL
ALT: 18 U/L (ref 6–29)
AST: 17 U/L (ref 10–35)
Albumin: 3.4 g/dL — ABNORMAL LOW (ref 3.6–5.1)
Alkaline Phosphatase: 84 U/L (ref 33–130)
BUN: 17 mg/dL (ref 7–25)
CHLORIDE: 102 mmol/L (ref 98–110)
CO2: 28 mmol/L (ref 20–31)
Calcium: 8.8 mg/dL (ref 8.6–10.4)
Creat: 1.03 mg/dL — ABNORMAL HIGH (ref 0.50–0.99)
GLUCOSE: 256 mg/dL — AB (ref 65–99)
POTASSIUM: 3.9 mmol/L (ref 3.5–5.3)
Sodium: 139 mmol/L (ref 135–146)
Total Bilirubin: 0.3 mg/dL (ref 0.2–1.2)
Total Protein: 6.4 g/dL (ref 6.1–8.1)

## 2015-08-24 LAB — LIPID PANEL
CHOL/HDL RATIO: 2.2 ratio (ref <=5.0)
Cholesterol: 127 mg/dL (ref 125–200)
HDL: 59 mg/dL (ref >=46)
LDL CALC: 51 mg/dL (ref <130)
Triglycerides: 85 mg/dL (ref <150)
VLDL: 17 mg/dL (ref <30)

## 2015-08-24 MED ORDER — MELOXICAM 7.5 MG PO TABS: ORAL_TABLET | ORAL | Status: DC

## 2015-08-24 NOTE — Progress Notes (Signed)
Patient ID: [name], female   DOB: 1951-03-31, 64 y.o.   MRN: [id number]    Subjective: CC: shoulder pain HPI: Patient is a 64 y.o. female presenting to clinic today for office visit. Concerns today include:  1. R shoulder/ back pain Patient reports that she has had r shoulder pain for a while.  She has been told she has a rotator cuff issue.  Mobic works well to relieve this.  She is reluctant to have sports medicine evaluate because she is nervous about potential shoulder injections impacting her sugar levels.  2. Hypertension Blood pressure at home: does not monitor Blood pressure today: 122/84 Meds: Compliant with Hyzaar ROS: Denies headache, dizziness, visual changes, nausea, vomiting, chest pain, abdominal pain or shortness of breath.  No orthopnea. occ end of day LE edema.  Social History Reviewed: non smoker. FamHx and MedHx updated.  Please see EMR. Health Maintenance: hep c screening  ROS: All other systems reviewed and are negative.  Objective: Office vital signs reviewed. BP 129/84 mmHg  Pulse 72  Temp(Src) 98.2 F (36.8 C) (Oral)  Ht 5\' 7"  (1.702 m)  Wt 267 lb 11.2 oz (121.428 kg)  BMI 41.92 kg/m2  Physical Examination:  General: Awake, alert, well nourished, NAD HEENT: Normal, EOMI, MMM Cardio: RRR, S1S2 heard, no murmurs appreciated Pulm: CTAB, no wheezes, rhonchi or rales Extremities: WWP, trace edema, cyanosis or clubbing; +2 pulses bilaterally. AROM normal. MSK: Normal gait and station Skin: dry, intact, no rashes or lesions  Assessment/ Plan: 64 y.o. female   1. Back pain without radiation/ shoulder pain - meloxicam (MOBIC) 7.5 MG tablet; TAKE 1 TABLET DAILY AS NEEDED FOR PAIN  Dispense: 90 tablet; Refill: 1 - consider referral to sports medicine for shoulder injection  2. Essential hypertension, controlled on Hyzaar - Continue current medication regimen - Comprehensive metabolic panel - Lipid Panel - follow up in 3-6 months or sooner  if needed  3. Need for hepatitis C screening test - Hepatitis C antibody  Janora Norlander, DO PGY-2, [place]

## 2015-08-24 NOTE — Patient Instructions (Signed)
Plan to see me in 6 months for blood pressure.  Ashly M. Lajuana Ripple, DO PGY-2, [facility] Edema Edema is an abnormal buildup of fluids. It is more common in your legs and thighs. Painless swelling of the feet and ankles is more likely as a person ages. It also is common in looser skin, like around your eyes. HOME CARE   Keep the affected body part above the level of the heart while lying down.  Do not sit still or stand for a long time.  Do not put anything right under your knees when you lie down.  Do not wear tight clothes on your upper legs.  Exercise your legs to help the puffiness (swelling) go down.  Wear elastic bandages or support stockings as told by your doctor.  A low-salt diet may help lessen the puffiness.  Only take medicine as told by your doctor. GET HELP IF:  Treatment is not working.  You have heart, liver, or kidney disease and notice that your skin looks puffy or shiny.  You have puffiness in your legs that does not get better when you raise your legs.  You have sudden weight gain for no reason. GET HELP RIGHT AWAY IF:   You have shortness of breath or chest pain.  You cannot breathe when you lie down.  You have pain, redness, or warmth in the areas that are puffy.  You have heart, liver, or kidney disease and get edema all of a sudden.  You have a fever and your symptoms get worse all of a sudden. MAKE SURE YOU:   Understand these instructions.  Will watch your condition.  Will get help right away if you are not doing well or get worse. Document Released: 05/29/2008 Document Revised: 12/16/2013 Document Reviewed: 10/03/2013 [facility] Patient Information 2015 [place], [place]. This information is not intended to replace advice given to you by your health care provider. Make sure you discuss any questions you have with your health care provider.

## 2015-08-25 ENCOUNTER — Encounter: Payer: Self-pay | Admitting: Family Medicine

## 2015-08-25 ENCOUNTER — Other Ambulatory Visit: Payer: Self-pay | Admitting: Family Medicine

## 2015-08-25 DIAGNOSIS — E109 Type 1 diabetes mellitus without complications: Secondary | ICD-10-CM

## 2015-08-25 LAB — HEPATITIS C ANTIBODY: HCV AB: NEGATIVE

## 2015-08-29 ENCOUNTER — Inpatient Hospital Stay (HOSPITAL_COMMUNITY)
Admission: EM | Admit: 2015-08-29 | Discharge: 2015-08-31 | DRG: 065 | Disposition: A | Discharge status: Home Health | Payer: BC Managed Care – PPO | Attending: Family Medicine | Admitting: Family Medicine

## 2015-08-29 ENCOUNTER — Emergency Department (HOSPITAL_COMMUNITY): Payer: BC Managed Care – PPO

## 2015-08-29 ENCOUNTER — Inpatient Hospital Stay (HOSPITAL_COMMUNITY): Payer: BC Managed Care – PPO

## 2015-08-29 ENCOUNTER — Encounter (HOSPITAL_COMMUNITY): Payer: Self-pay | Admitting: Emergency Medicine

## 2015-08-29 DIAGNOSIS — F329 Major depressive disorder, single episode, unspecified: Secondary | ICD-10-CM | POA: Diagnosis present

## 2015-08-29 DIAGNOSIS — E039 Hypothyroidism, unspecified: Secondary | ICD-10-CM | POA: Diagnosis present

## 2015-08-29 DIAGNOSIS — I639 Cerebral infarction, unspecified: Principal | ICD-10-CM | POA: Diagnosis present

## 2015-08-29 DIAGNOSIS — Z9641 Presence of insulin pump (external) (internal): Secondary | ICD-10-CM | POA: Diagnosis present

## 2015-08-29 DIAGNOSIS — E782 Mixed hyperlipidemia: Secondary | ICD-10-CM | POA: Insufficient documentation

## 2015-08-29 DIAGNOSIS — E1069 Type 1 diabetes mellitus with other specified complication: Secondary | ICD-10-CM | POA: Insufficient documentation

## 2015-08-29 DIAGNOSIS — Z809 Family history of malignant neoplasm, unspecified: Secondary | ICD-10-CM | POA: Diagnosis not present

## 2015-08-29 DIAGNOSIS — I10 Essential (primary) hypertension: Secondary | ICD-10-CM | POA: Diagnosis present

## 2015-08-29 DIAGNOSIS — I6302 Cerebral infarction due to thrombosis of basilar artery: Secondary | ICD-10-CM | POA: Insufficient documentation

## 2015-08-29 DIAGNOSIS — E1059 Type 1 diabetes mellitus with other circulatory complications: Secondary | ICD-10-CM | POA: Diagnosis not present

## 2015-08-29 DIAGNOSIS — Z7982 Long term (current) use of aspirin: Secondary | ICD-10-CM | POA: Diagnosis not present

## 2015-08-29 DIAGNOSIS — Z85850 Personal history of malignant neoplasm of thyroid: Secondary | ICD-10-CM | POA: Diagnosis not present

## 2015-08-29 DIAGNOSIS — Z833 Family history of diabetes mellitus: Secondary | ICD-10-CM

## 2015-08-29 DIAGNOSIS — E785 Hyperlipidemia, unspecified: Secondary | ICD-10-CM | POA: Diagnosis present

## 2015-08-29 DIAGNOSIS — I679 Cerebrovascular disease, unspecified: Secondary | ICD-10-CM | POA: Insufficient documentation

## 2015-08-29 DIAGNOSIS — K219 Gastro-esophageal reflux disease without esophagitis: Secondary | ICD-10-CM | POA: Diagnosis present

## 2015-08-29 DIAGNOSIS — Z794 Long term (current) use of insulin: Secondary | ICD-10-CM

## 2015-08-29 DIAGNOSIS — G8929 Other chronic pain: Secondary | ICD-10-CM | POA: Diagnosis present

## 2015-08-29 DIAGNOSIS — M1990 Unspecified osteoarthritis, unspecified site: Secondary | ICD-10-CM | POA: Diagnosis present

## 2015-08-29 DIAGNOSIS — M549 Dorsalgia, unspecified: Secondary | ICD-10-CM | POA: Diagnosis present

## 2015-08-29 DIAGNOSIS — I6789 Other cerebrovascular disease: Secondary | ICD-10-CM | POA: Diagnosis not present

## 2015-08-29 DIAGNOSIS — Z8249 Family history of ischemic heart disease and other diseases of the circulatory system: Secondary | ICD-10-CM

## 2015-08-29 DIAGNOSIS — Z6841 Body Mass Index (BMI) 40.0 and over, adult: Secondary | ICD-10-CM | POA: Diagnosis not present

## 2015-08-29 DIAGNOSIS — R531 Weakness: Secondary | ICD-10-CM | POA: Diagnosis present

## 2015-08-29 DIAGNOSIS — E109 Type 1 diabetes mellitus without complications: Secondary | ICD-10-CM | POA: Diagnosis present

## 2015-08-29 DIAGNOSIS — E038 Other specified hypothyroidism: Secondary | ICD-10-CM | POA: Diagnosis not present

## 2015-08-29 LAB — URINALYSIS, ROUTINE W REFLEX MICROSCOPIC
BILIRUBIN URINE: NEGATIVE
GLUCOSE, UA: NEGATIVE mg/dL
Hgb urine dipstick: NEGATIVE
KETONES UR: NEGATIVE mg/dL
Leukocytes, UA: NEGATIVE
Nitrite: NEGATIVE
PH: 6.5 (ref 5.0–8.0)
Protein, ur: NEGATIVE mg/dL
Specific Gravity, Urine: 1.004 — ABNORMAL LOW (ref 1.005–1.030)
Urobilinogen, UA: 0.2 mg/dL (ref 0.0–1.0)

## 2015-08-29 LAB — CBC
HEMATOCRIT: 37.1 % (ref 36.0–46.0)
Hemoglobin: 11.9 g/dL — ABNORMAL LOW (ref 12.0–15.0)
MCH: 28.3 pg (ref 26.0–34.0)
MCHC: 32.1 g/dL (ref 30.0–36.0)
MCV: 88.3 fL (ref 78.0–100.0)
Platelets: 219 10*3/uL (ref 150–400)
RBC: 4.2 MIL/uL (ref 3.87–5.11)
RDW: 12.8 % (ref 11.5–15.5)
WBC: 4.5 10*3/uL (ref 4.0–10.5)

## 2015-08-29 LAB — DIFFERENTIAL
BASOS PCT: 1 % (ref 0–1)
Basophils Absolute: 0 10*3/uL (ref 0.0–0.1)
Eosinophils Absolute: 0 10*3/uL (ref 0.0–0.7)
Eosinophils Relative: 1 % (ref 0–5)
LYMPHS PCT: 35 % (ref 12–46)
Lymphs Abs: 1.5 10*3/uL (ref 0.7–4.0)
MONO ABS: 0.4 10*3/uL (ref 0.1–1.0)
MONOS PCT: 10 % (ref 3–12)
NEUTROS ABS: 2.4 10*3/uL (ref 1.7–7.7)
Neutrophils Relative %: 53 % (ref 43–77)

## 2015-08-29 LAB — GLUCOSE, CAPILLARY
GLUCOSE-CAPILLARY: 269 mg/dL — AB (ref 65–99)
Glucose-Capillary: 190 mg/dL — ABNORMAL HIGH (ref 65–99)
Glucose-Capillary: 228 mg/dL — ABNORMAL HIGH (ref 65–99)

## 2015-08-29 LAB — I-STAT CHEM 8, ED
BUN: 16 mg/dL (ref 6–20)
CREATININE: 0.9 mg/dL (ref 0.44–1.00)
Calcium, Ion: 1.1 mmol/L — ABNORMAL LOW (ref 1.13–1.30)
Chloride: 106 mmol/L (ref 101–111)
GLUCOSE: 132 mg/dL — AB (ref 65–99)
HCT: 41 % (ref 36.0–46.0)
HEMOGLOBIN: 13.9 g/dL (ref 12.0–15.0)
Potassium: 3.9 mmol/L (ref 3.5–5.1)
Sodium: 139 mmol/L (ref 135–145)
TCO2: 25 mmol/L (ref 0–100)

## 2015-08-29 LAB — COMPREHENSIVE METABOLIC PANEL
ALK PHOS: 86 U/L (ref 38–126)
ALT: 23 U/L (ref 14–54)
AST: 25 U/L (ref 15–41)
Albumin: 3.3 g/dL — ABNORMAL LOW (ref 3.5–5.0)
Anion gap: 7 (ref 5–15)
BUN: 13 mg/dL (ref 6–20)
CALCIUM: 9.1 mg/dL (ref 8.9–10.3)
CO2: 26 mmol/L (ref 22–32)
CREATININE: 0.92 mg/dL (ref 0.44–1.00)
Chloride: 106 mmol/L (ref 101–111)
Glucose, Bld: 137 mg/dL — ABNORMAL HIGH (ref 65–99)
Potassium: 4 mmol/L (ref 3.5–5.1)
Sodium: 139 mmol/L (ref 135–145)
Total Bilirubin: 0.5 mg/dL (ref 0.3–1.2)
Total Protein: 6.8 g/dL (ref 6.5–8.1)

## 2015-08-29 LAB — PROTIME-INR
INR: 1.02 (ref 0.00–1.49)
Prothrombin Time: 13.6 seconds (ref 11.6–15.2)

## 2015-08-29 LAB — APTT: aPTT: 27 seconds (ref 24–37)

## 2015-08-29 LAB — I-STAT TROPONIN, ED: TROPONIN I, POC: 0.01 ng/mL (ref 0.00–0.08)

## 2015-08-29 LAB — CBG MONITORING, ED: Glucose-Capillary: 130 mg/dL — ABNORMAL HIGH (ref 65–99)

## 2015-08-29 MED ORDER — LORAZEPAM 2 MG/ML IJ SOLN
1.0000 mg | Freq: Once | INTRAMUSCULAR | Status: AC
  Administered 2015-08-29: 1 mg via INTRAVENOUS
  Filled 2015-08-29: qty 1

## 2015-08-29 MED ORDER — IOHEXOL 350 MG/ML SOLN
80.0000 mL | Freq: Once | INTRAVENOUS | Status: AC | PRN
  Administered 2015-08-29: 80 mL via INTRAVENOUS

## 2015-08-29 MED ORDER — PANTOPRAZOLE SODIUM 40 MG PO TBEC
40.0000 mg | DELAYED_RELEASE_TABLET | Freq: Every day | ORAL | Status: DC
  Administered 2015-08-30 – 2015-08-31 (×2): 40 mg via ORAL
  Filled 2015-08-29 (×2): qty 1

## 2015-08-29 MED ORDER — STROKE: EARLY STAGES OF RECOVERY BOOK
Freq: Once | Status: AC
  Administered 2015-08-29: 14:00:00

## 2015-08-29 MED ORDER — INSULIN ASPART 100 UNIT/ML ~~LOC~~ SOLN
0.0000 [IU] | Freq: Three times a day (TID) | SUBCUTANEOUS | Status: DC
  Administered 2015-08-30: 10 [IU] via SUBCUTANEOUS

## 2015-08-29 MED ORDER — ACETAMINOPHEN 325 MG PO TABS: 650.0000 mg | ORAL_TABLET | Freq: Three times a day (TID) | ORAL | Status: DC | PRN

## 2015-08-29 MED ORDER — MELOXICAM 7.5 MG PO TABS: 7.5000 mg | ORAL_TABLET | Freq: Every day | ORAL | Status: DC | PRN

## 2015-08-29 MED ORDER — ASPIRIN 81 MG PO CHEW
324.0000 mg | CHEWABLE_TABLET | Freq: Once | ORAL | Status: AC
  Administered 2015-08-29: 324 mg via ORAL
  Filled 2015-08-29: qty 4

## 2015-08-29 MED ORDER — LEVOTHYROXINE SODIUM 50 MCG PO TABS
150.0000 ug | ORAL_TABLET | Freq: Every day | ORAL | Status: DC
  Administered 2015-08-30 – 2015-08-31 (×2): 150 ug via ORAL
  Filled 2015-08-29 (×4): qty 1

## 2015-08-29 MED ORDER — SENNOSIDES-DOCUSATE SODIUM 8.6-50 MG PO TABS: 1.0000 | ORAL_TABLET | Freq: Every evening | ORAL | Status: DC | PRN

## 2015-08-29 MED ORDER — INSULIN PUMP
1.0000 | SUBCUTANEOUS | Status: DC
  Filled 2015-08-29: qty 1

## 2015-08-29 MED ORDER — INSULIN ASPART 100 UNIT/ML ~~LOC~~ SOLN
0.0000 [IU] | Freq: Every day | SUBCUTANEOUS | Status: DC
  Administered 2015-08-29: 3 [IU] via SUBCUTANEOUS

## 2015-08-29 MED ORDER — ASPIRIN EC 81 MG PO TBEC: 81.0000 mg | DELAYED_RELEASE_TABLET | Freq: Every day | ORAL | Status: DC

## 2015-08-29 MED ORDER — ATORVASTATIN CALCIUM 40 MG PO TABS
40.0000 mg | ORAL_TABLET | Freq: Every day | ORAL | Status: DC
  Administered 2015-08-29 – 2015-08-31 (×3): 40 mg via ORAL
  Filled 2015-08-29 (×3): qty 1

## 2015-08-29 MED ORDER — ENOXAPARIN SODIUM 40 MG/0.4ML ~~LOC~~ SOLN
40.0000 mg | SUBCUTANEOUS | Status: DC
Start: 2015-08-29 — End: 2015-08-31
  Administered 2015-08-29 – 2015-08-31 (×3): 40 mg via SUBCUTANEOUS
  Filled 2015-08-29 (×4): qty 0.4

## 2015-08-29 MED ORDER — ASPIRIN EC 81 MG PO TBEC
81.0000 mg | DELAYED_RELEASE_TABLET | Freq: Every day | ORAL | Status: DC
  Administered 2015-08-30 – 2015-08-31 (×2): 81 mg via ORAL
  Filled 2015-08-29 (×2): qty 1

## 2015-08-29 MED ORDER — ATORVASTATIN CALCIUM 40 MG PO TABS: 40.0000 mg | ORAL_TABLET | Freq: Every day | ORAL | Status: DC

## 2015-08-29 NOTE — ED Provider Notes (Signed)
CSN: [id number]     Arrival date & time 08/29/15  2025 History   First MD Initiated Contact with Patient 08/29/15 [phone]     Chief Complaint  Patient presents with  . Numbness  . Gait Problem     (Consider location/radiation/quality/duration/timing/severity/associated sxs/prior Treatment) HPI Comments: Patient with a history of HTN, DM, and Hyperlipidemia presents today with complaint of feeling like her equilibrium is off.  She states onset of symptoms this morning around 5 AM when she woke up.  She states that she felt hot and just felt different when she woke up.  She states that she felt some numbness of the left hand, which is gradually improving.  She also states that she felt like her left leg was going to give away when she ambulated this morning.  She reports that both her left leg and left arm feel weaker than normal.  She states, "I feel like my equilibrium is off."  She suspected that her blood sugar may be low and therefore checked it.  She states that her blood sugar was 139 at that time.  She denies dizziness, vision changes, headache, nausea, vomiting, chest pain, SOB, facial droop, difficulty swallowing, or difficulty speaking.  She states that she currently takes 81 mg ASA daily.  No history of CVA.    The history is provided by the patient.    Past Medical History  Diagnosis Date  . Thyroid cancer     per pt S/p Total Thyroidectomy with Radioactive Iodine Therapy  . Hyperlipidemia   . Hypothyroidism   . Hypertension   . GERD (gastroesophageal reflux disease)   . Seasonal allergies   . Type 1 diabetes mellitus   . Hemorrhoids   . Depression   . Anemia   . Arthritis   . CHF (congestive heart failure)    Past Surgical History  Procedure Laterality Date  . Thyroidectomy  2006  . Abdominal hysterectomy  1995    partial  . Cardiac catheterization      with coronary angiogram  . Ankle fracture surgery Right   . Mini meter   09/08/2014    ELECTRONIC INSULIN PUMP  .  Colonoscopy N/A 09/09/2014    Procedure: COLONOSCOPY;  Surgeon: Gatha Mayer, MD;  Location: [place];  Service: Endoscopy;  Laterality: N/A;  . Left and right heart catheterization with coronary angiogram N/A 05/14/2012    Procedure: LEFT AND RIGHT HEART CATHETERIZATION WITH CORONARY ANGIOGRAM;  Surgeon: Laverda Page, MD;  Location: [facility] CATH LAB;  Service: Cardiovascular;  Laterality: N/A;  . Abdominal angiogram N/A 05/14/2012    Procedure: ABDOMINAL ANGIOGRAM;  Surgeon: Laverda Page, MD;  Location: [facility] CATH LAB;  Service: Cardiovascular;  Laterality: N/A;   Family History  Problem Relation Age of Onset  . Heart disease Mother     MI  . Hyperlipidemia Mother   . Hypertension Mother   . Early death Father     Head Injury at Work  . Hyperlipidemia Father   . Hypertension Father   . Diabetes Maternal Aunt   . Prostate cancer Paternal Uncle   . Heart disease Maternal Aunt   . Renal Disease Mother     insufficiency   Social History  Substance Use Topics  . Smoking status: Never Smoker   . Smokeless tobacco: Never Used  . Alcohol Use: No   OB History    No data available     Review of Systems  All other systems reviewed and are  negative.     Allergies  Review of patient's allergies indicates no known allergies.  Home Medications   Prior to Admission medications   Medication Sig Start Date End Date Taking? Authorizing Provider  Acetaminophen 650 MG TABS Take 1,300 mg by mouth daily as needed (for pain).    Historical Provider, MD  aspirin EC 81 MG tablet Take 1 tablet (81 mg total) by mouth daily. 07/10/14   Ashly Windell Moulding, DO  atorvastatin (LIPITOR) 20 MG tablet Take 1 tablet (20 mg total) by mouth daily. 06/03/15   Ashly Windell Moulding, DO  Insulin Human (INSULIN PUMP) 100 unit/ml SOLN Inject 1 each into the skin continuous. Uses novolog insulin in her pump.    Historical Provider, MD  isosorbide mononitrate (IMDUR) 30 MG 24 hr tablet Take 1 tablet (30 mg  total) by mouth daily. 06/16/15   Janora Norlander, DO  levothyroxine (SYNTHROID, LEVOTHROID) 150 MCG tablet Take 1 tablet (150 mcg total) by mouth daily before breakfast. 06/04/15   Janora Norlander, DO  losartan-hydrochlorothiazide (HYZAAR) 50-12.5 MG per tablet TAKE 1 TABLET DAILY 07/28/15   Janora Norlander, DO  meloxicam (MOBIC) 7.5 MG tablet TAKE 1 TABLET DAILY AS NEEDED FOR PAIN 08/24/15   Janora Norlander, DO  metoprolol succinate (TOPROL-XL) 50 MG 24 hr tablet TAKE 1 TABLET DAILY WITH OR IMMEDIATELY FOLLOWING A MEAL (REPLACES METOPROLOL TARTRATE) 08/17/15   Ashly Windell Moulding, DO  pantoprazole (PROTONIX) 40 MG tablet Take 1 tablet (40 mg total) by mouth daily. 06/08/15   Ashly Windell Moulding, DO  POTASSIUM PO Take 1 tablet by mouth daily as needed (for leg cramps). OTC    Historical Provider, MD   BP 151/71 mmHg  Pulse 70  Temp(Src) 97.6 F (36.4 C) (Oral)  Resp 16  Wt 267 lb (121.11 kg)  SpO2 100% Physical Exam  Constitutional: She appears well-developed and well-nourished.  HENT:  Head: Normocephalic and atraumatic.  Eyes: EOM are normal. Pupils are equal, round, and reactive to light.  Neck: Normal range of motion. Neck supple.  Cardiovascular: Normal rate, regular rhythm and normal heart sounds.   Pulmonary/Chest: Effort normal and breath sounds normal.  Neurological: She is alert. She has normal strength. No cranial nerve deficit or sensory deficit.  Distal sensation of extremities intact  Skin: Skin is warm and dry.  Psychiatric: She has a normal mood and affect.  Nursing note and vitals reviewed.   ED Course  Procedures (including critical care time) Labs Review Labs Reviewed  PROTIME-INR  APTT  CBC  DIFFERENTIAL  COMPREHENSIVE METABOLIC PANEL  I-STAT TROPOININ, ED  CBG MONITORING, ED  I-STAT CHEM 8, ED    Imaging Review Ct Angio Head W/cm &/or Wo Cm  08/29/2015   CLINICAL DATA:  Stroke with cerebral ischemia. Dysphasia. Left-sided weakness.  EXAM: CT  ANGIOGRAPHY HEAD  TECHNIQUE: Multidetector CT imaging of the head was performed using the standard protocol during bolus administration of intravenous contrast. Multiplanar CT image reconstructions and MIPs were obtained to evaluate the vascular anatomy.  CONTRAST:  7mL OMNIPAQUE IOHEXOL 350 MG/ML SOLN  COMPARISON:  MRI 08/29/2015  FINDINGS: CT HEAD  Brain: Mild atrophy. Mild chronic microvascular ischemic change in the white matter. Small infarct in the posterior pons seen on MRI not identified on CT. Negative for hemorrhage or mass.  Calvarium and skull base: Negative  Paranasal sinuses: Negative  Orbits: Negative  CTA HEAD  Anterior circulation: Extensive atherosclerotic calcification throughout the cavernous carotid bilaterally causing moderate to severe  stenosis bilaterally. Cavernous carotid is patent bilaterally. Anterior and middle cerebral arteries patent bilaterally without significant stenosis.  Posterior circulation: Both vertebral arteries are patent to the basilar. PICA patent bilaterally. Moderate to severe stenosis of the distal vertebral artery due to calcific stenosis at the level of the dura bilaterally. Basilar patent without stenosis. Superior cerebellar and posterior cerebral arteries patent bilaterally without stenosis.  Venous sinuses: Patent  Anatomic variants: Negative for cerebral aneurysm  Delayed phase:Normal enhancement on delayed imaging.  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 Anterior and middle cerebral arteries widely patent bilaterally  Moderate to severe stenosis distal vertebral artery bilaterally due to calcific stenosis.   Electronically Signed   By: Franchot Gallo M.D.   On: 08/29/2015 13:17   Mr Brain Wo Contrast  08/29/2015   CLINICAL DATA:  Left-sided weakness and numbness. Diabetes and hypertension.  EXAM: MRI HEAD WITHOUT CONTRAST  TECHNIQUE: Multiplanar, multiecho pulse sequences of the brain and  surrounding structures were obtained without intravenous contrast.  COMPARISON:  None.  FINDINGS: Small focus of acute infarct in the dorsal brainstem in the region of the floor of the fourth ventricle. This measures approximately 3 x 4 mm and is best seen on the focused coronal diffusion-weighted sequence. No other acute infarct identified  Chronic infarct left pons. Mild chronic microvascular ischemic change in the white matter.  Negative for hemorrhage or mass lesion.  Negative for hydrocephalus.  Negative for mass or edema  Paranasal sinuses are clear.  IMPRESSION: Small acute infarct in the dorsal brainstem region of medial lemniscus.  Chronic infarct left pons   Electronically Signed   By: Franchot Gallo M.D.   On: 08/29/2015 10:26   I have personally reviewed and evaluated these images and lab results as part of my medical decision-making.   EKG Interpretation   Date/Time:  Sunday August 29 2015 08:07:49 EDT Ventricular Rate:  69 PR Interval:  147 QRS Duration: 95 QT Interval:  486 QTC Calculation: 521 R Axis:   40 Text Interpretation:  Sinus rhythm Borderline T wave abnormalities  Prolonged QT interval Baseline wander in lead(s) I II aVR No significant  change since last tracing Confirmed by JACUBOWITZ  MD, SAM ([id number]) on  08/29/2015 8:32:37 AM     10 :53 AM Discussed with Dr. Aram Beecham with Neurology who recommends admission to medicine. MDM   Final diagnoses:  None   Patient presents today with a complaints of left sided weakness and also feeling like her equilibrium is off.  She has risk factors for CVA including HTN, Hyperlipidemia, and DM. Labs today unremarkable.  MRI showing an acute infarct of the brain stem.  Dr. Armida Sans with Neurology consulted.  Patient admitted to [facility].      Hyman Bible, PA-C 08/29/15 [place], MD 08/29/15 1731

## 2015-08-29 NOTE — ED Provider Notes (Signed)
Patient went to bed last night feeling normal. Awakens 6 AM today feeling numbness in her left hand all fingers and difficulty with walking and with balance and feels weak in left leg. No other complaint numbness left hand is improving spontaneously, without treatment weakness in left leg and difficulty walking continues. On exam alert, Glasgow coma score 15 HEENT exam no facial asymmetry neck supple no bruit neurologic Glasgow Coma Score 15 gait is unsteady, false to left. DTRs symmetric bilaterally knee jerk and ankle jerk biceps put downward going bilaterally throughout Romberg normal pronator drift normal  Orlie Dakin, MD 08/29/15 [phone]

## 2015-08-29 NOTE — Progress Notes (Addendum)
Patient arrived to room from ED. Safety precautions and orders reviewed with patient. TELE applied and confirmed. She denied pain or other distress at this time. Patient noted with insulin pump on LLQ appears CDI. MD aware. DM coordinator ordered for further eval and usage. Will continue to monitor.   Ave Filter, RN

## 2015-08-29 NOTE — Progress Notes (Signed)
Patient BG of 190 at dinner time. 4 units given per home insulin pump per patient.   Ave Filter, RN

## 2015-08-29 NOTE — Consult Note (Signed)
Referring Physician: ED    Chief Complaint: left arm numbness, left leg weakness, trouble walking.  HPI:                                                                                                                                         [name] is an 64 y.o. female with a past medical history that is relevant for HTN, DM type 2, hyperlipidemia, chronic congestive heart failure, thyroid cancer s/p total thyroidectomy, depression, and GERD, comes in today for evaluation of the aforementioned symptoms. Stated that she never had similar symptoms before. Went to bed last night around 10 pm feeling well, woke up at 2:30 " feeling hot, like when by BP is hight". By that time, she was already experiencing some abnormal sensation in her left arm but decided to laid in bed. Then, when she woke up again to go to the bathroom at 5 am noticed that she couldn't walk properly because the left leg was heavy/weak, fell back to her bed,  and already the entire left arm was numb. Denies associated HA, vertigo, double vision, difficulty swallowing, weakness left arm, slurred speech, language or vision impairment. Michela Pitcher that she is " just stuttering my words". MRI brain performed in the ED was personally reviewed and demonstrated a minuscule area of acute infarct in the dorsal brainstem region of medial lemniscus. Mrs. Fossett said that she takes aspirin although admits that many times she forgets to take it.  Date last known well: 93/16 Time last known well: 10 pm tPA Given: no, late presentation   Past Medical History  Diagnosis Date  . Thyroid cancer     per pt S/p Total Thyroidectomy with Radioactive Iodine Therapy  . Hyperlipidemia   . Hypothyroidism   . Hypertension   . GERD (gastroesophageal reflux disease)   . Seasonal allergies   . Type 1 diabetes mellitus   . Hemorrhoids   . Depression   . Anemia   . Arthritis   . CHF (congestive heart failure)     Past Surgical History  Procedure  Laterality Date  . Thyroidectomy  2006  . Abdominal hysterectomy  1995    partial  . Cardiac catheterization      with coronary angiogram  . Ankle fracture surgery Right   . Mini meter   09/08/2014    ELECTRONIC INSULIN PUMP  . Colonoscopy N/A 09/09/2014    Procedure: COLONOSCOPY;  Surgeon: Gatha Mayer, MD;  Location: [place];  Service: Endoscopy;  Laterality: N/A;  . Left and right heart catheterization with coronary angiogram N/A 05/14/2012    Procedure: LEFT AND RIGHT HEART CATHETERIZATION WITH CORONARY ANGIOGRAM;  Surgeon: Laverda Page, MD;  Location: [facility] CATH LAB;  Service: Cardiovascular;  Laterality: N/A;  . Abdominal angiogram N/A 05/14/2012    Procedure: ABDOMINAL ANGIOGRAM;  Surgeon: Laverda Page, MD;  Location: [facility] CATH LAB;  Service: Cardiovascular;  Laterality: N/A;    Family History  Problem Relation Age of Onset  . Heart disease Mother     MI  . Hyperlipidemia Mother   . Hypertension Mother   . Early death Father     Head Injury at Work  . Hyperlipidemia Father   . Hypertension Father   . Diabetes Maternal Aunt   . Prostate cancer Paternal Uncle   . Heart disease Maternal Aunt   . Renal Disease Mother     insufficiency   Social History:  reports that she has never smoked. She has never used smokeless tobacco. She reports that she does not drink alcohol or use illicit drugs. Family history: no MS, epilepsy, brain tumor, or brain aneurysm Allergies: No Known Allergies  Medications:                                                                                                                           I have reviewed the patient's current medications.  ROS:                                                                                                                                       History obtained from chart review and the patient  General ROS: negative for - chills, fatigue, fever, night sweats, weight gain or weight loss Psychological  ROS: negative for - behavioral disorder, hallucinations, memory difficulties, mood swings or suicidal ideation Ophthalmic ROS: negative for - blurry vision, double vision, eye pain or loss of vision ENT ROS: negative for - epistaxis, nasal discharge, oral lesions, sore throat, tinnitus or vertigo Allergy and Immunology ROS: negative for - hives or itchy/watery eyes Hematological and Lymphatic ROS: negative for - bleeding problems, bruising or swollen lymph nodes Endocrine ROS: negative for - galactorrhea, hair pattern changes, polydipsia/polyuria or temperature intolerance Respiratory ROS: negative for - cough, hemoptysis, shortness of breath or wheezing Cardiovascular ROS: negative for - chest pain, dyspnea on exertion, edema or irregular heartbeat Gastrointestinal ROS: negative for - abdominal pain, diarrhea, hematemesis, nausea/vomiting or stool incontinence Genito-Urinary ROS: negative for - dysuria, hematuria, incontinence or urinary frequency/urgency Musculoskeletal ROS: negative for - joint swelling Neurological ROS: as noted in HPI Dermatological ROS: negative for rash and skin lesion changes   Physical exam:  Constitutional: well developed, pleasant female in no apparent distress. Blood pressure 141/69, pulse 63, temperature 97.6  F (36.4 C), temperature source Oral, resp. rate 18, weight 121.11 kg (267 lb), SpO2 100 %. Eyes: no jaundice or exophthalmos.  Head: normocephalic. Neck: supple, no bruits, no JVD. Cardiac: no murmurs. Lungs: clear. Abdomen: soft, no tender, no mass. Extremities: mild bilateral LE pitting edema, no clubbing, or cyanosis.  Skin: no rash  Neurologic Examination:                                                                                                      General: Mental Status: Alert, oriented, thought content appropriate.  Speech fluent without evidence of aphasia.  Able to follow 3 step commands without difficulty. Cranial Nerves: II: Discs  flat bilaterally; Visual fields grossly normal, pupils equal, round, reactive to light and accommodation III,IV, VI: ptosis not present, extra-ocular motions intact bilaterally V,VII: smile symmetric, facial light touch sensation normal bilaterally VIII: hearing normal bilaterally IX,X: uvula rises symmetrically XI: bilateral shoulder shrug XII: midline tongue extension without atrophy or fasciculations Motor: Mild drift left LE Tone and bulk:normal tone throughout; no atrophy noted Sensory: Pinprick and light touch intact throughout, bilaterally Deep Tendon Reflexes:  1+ all over Plantars: Right: downgoing   Left: downgoing Cerebellar: normal finger-to-nose,  normal heel-to-shin test Gait:  No tested due to multiple leads CV: pulses palpable throughout    Results for orders placed or performed during the hospital encounter of 08/29/15 (from the past 48 hour(s))  Protime-INR     Status: None   Collection Time: 08/29/15  8:30 AM  Result Value Ref Range   Prothrombin Time 13.6 11.6 - 15.2 seconds   INR 1.02 0.00 - 1.49  APTT     Status: None   Collection Time: 08/29/15  8:30 AM  Result Value Ref Range   aPTT 27 24 - 37 seconds  CBC     Status: Abnormal   Collection Time: 08/29/15  8:30 AM  Result Value Ref Range   WBC 4.5 4.0 - 10.5 K/uL   RBC 4.20 3.87 - 5.11 MIL/uL   Hemoglobin 11.9 (L) 12.0 - 15.0 g/dL   HCT 37.1 36.0 - 46.0 %   MCV 88.3 78.0 - 100.0 fL   MCH 28.3 26.0 - 34.0 pg   MCHC 32.1 30.0 - 36.0 g/dL   RDW 12.8 11.5 - 15.5 %   Platelets 219 150 - 400 K/uL  Differential     Status: None   Collection Time: 08/29/15  8:30 AM  Result Value Ref Range   Neutrophils Relative % 53 43 - 77 %   Neutro Abs 2.4 1.7 - 7.7 K/uL   Lymphocytes Relative 35 12 - 46 %   Lymphs Abs 1.5 0.7 - 4.0 K/uL   Monocytes Relative 10 3 - 12 %   Monocytes Absolute 0.4 0.1 - 1.0 K/uL   Eosinophils Relative 1 0 - 5 %   Eosinophils Absolute 0.0 0.0 - 0.7 K/uL   Basophils Relative 1 0 - 1  %   Basophils Absolute 0.0 0.0 - 0.1 K/uL  Comprehensive metabolic panel     Status: Abnormal  Collection Time: 08/29/15  8:30 AM  Result Value Ref Range   Sodium 139 135 - 145 mmol/L   Potassium 4.0 3.5 - 5.1 mmol/L   Chloride 106 101 - 111 mmol/L   CO2 26 22 - 32 mmol/L   Glucose, Bld 137 (H) 65 - 99 mg/dL   BUN 13 6 - 20 mg/dL   Creatinine, Ser 0.92 0.44 - 1.00 mg/dL   Calcium 9.1 8.9 - 10.3 mg/dL   Total Protein 6.8 6.5 - 8.1 g/dL   Albumin 3.3 (L) 3.5 - 5.0 g/dL   AST 25 15 - 41 U/L   ALT 23 14 - 54 U/L   Alkaline Phosphatase 86 38 - 126 U/L   Total Bilirubin 0.5 0.3 - 1.2 mg/dL   GFR calc non Af Amer >60 >60 mL/min   GFR calc Af Amer >60 >60 mL/min    Comment: (NOTE) The eGFR has been calculated using the CKD EPI equation. This calculation has not been validated in all clinical situations. eGFR's persistently <60 mL/min signify possible Chronic Kidney Disease.    Anion gap 7 5 - 15  I-stat troponin, ED (not at [facility], [facility])     Status: None   Collection Time: 08/29/15  8:44 AM  Result Value Ref Range   Troponin i, poc 0.01 0.00 - 0.08 ng/mL   Comment 3            Comment: Due to the release kinetics of cTnI, a negative result within the first hours of the onset of symptoms does not rule out myocardial infarction with certainty. If myocardial infarction is still suspected, repeat the test at appropriate intervals.   I-Stat Chem 8, ED  (not at [facility], [facility])     Status: Abnormal   Collection Time: 08/29/15  8:45 AM  Result Value Ref Range   Sodium 139 135 - 145 mmol/L   Potassium 3.9 3.5 - 5.1 mmol/L   Chloride 106 101 - 111 mmol/L   BUN 16 6 - 20 mg/dL   Creatinine, Ser 0.90 0.44 - 1.00 mg/dL   Glucose, Bld 132 (H) 65 - 99 mg/dL   Calcium, Ion 1.10 (L) 1.13 - 1.30 mmol/L   TCO2 25 0 - 100 mmol/L   Hemoglobin 13.9 12.0 - 15.0 g/dL   HCT 41.0 36.0 - 46.0 %  CBG monitoring, ED     Status: Abnormal   Collection Time: 08/29/15  8:48 AM  Result Value Ref Range    Glucose-Capillary 130 (H) 65 - 99 mg/dL  Urinalysis, Routine w reflex microscopic (not at [facility])     Status: Abnormal   Collection Time: 08/29/15  9:47 AM  Result Value Ref Range   Color, Urine YELLOW YELLOW   APPearance CLEAR CLEAR   Specific Gravity, Urine 1.004 (L) 1.005 - 1.030   pH 6.5 5.0 - 8.0   Glucose, UA NEGATIVE NEGATIVE mg/dL   Hgb urine dipstick NEGATIVE NEGATIVE   Bilirubin Urine NEGATIVE NEGATIVE   Ketones, ur NEGATIVE NEGATIVE mg/dL   Protein, ur NEGATIVE NEGATIVE mg/dL   Urobilinogen, UA 0.2 0.0 - 1.0 mg/dL   Nitrite NEGATIVE NEGATIVE   Leukocytes, UA NEGATIVE NEGATIVE    Comment: MICROSCOPIC NOT DONE ON URINES WITH NEGATIVE PROTEIN, BLOOD, LEUKOCYTES, NITRITE, OR GLUCOSE <1000 mg/dL.   Mr Brain Wo Contrast  08/29/2015   CLINICAL DATA:  Left-sided weakness and numbness. Diabetes and hypertension.  EXAM: MRI HEAD WITHOUT CONTRAST  TECHNIQUE: Multiplanar, multiecho pulse sequences of the brain and surrounding structures were  obtained without intravenous contrast.  COMPARISON:  None.  FINDINGS: Small focus of acute infarct in the dorsal brainstem in the region of the floor of the fourth ventricle. This measures approximately 3 x 4 mm and is best seen on the focused coronal diffusion-weighted sequence. No other acute infarct identified  Chronic infarct left pons. Mild chronic microvascular ischemic change in the white matter.  Negative for hemorrhage or mass lesion.  Negative for hydrocephalus.  Negative for mass or edema  Paranasal sinuses are clear.  IMPRESSION: Small acute infarct in the dorsal brainstem region of medial lemniscus.  Chronic infarct left pons   Electronically Signed   By: Franchot Gallo M.D.   On: 08/29/2015 10:26    Assessment: 64 y.o. female with acute onset left leg weakness and left arm numbness, with MRI demonstrating an acute infarct left in the dorsal brainstem, consequence of small vessel disease in he context of HTN, DM, hyperlipidemia. Out of the window  for thrombolysis. Admit to mediine. Ordered complete stroke work up. Aspirin. Stroke team will resume care tomorrow.  Stroke Risk Factors -  HTN, DM type 2, hyperlipidemia, chronic congestive heart failure  Plan: 1. HgbA1c, fasting lipid panel 2. MRI, MRA  of the brain without contrast 3. Echocardiogram 4. Carotid dopplers 5. Prophylactic therapy-aspirin 6. Risk factor modification 7. Telemetry monitoring 8. Frequent neuro checks 9. PT/OT SLP  Lorenza Chick [facility] [phone]  08/29/2015, 11:21 AM

## 2015-08-29 NOTE — ED Notes (Signed)
Patient transported to CT 

## 2015-08-29 NOTE — ED Notes (Signed)
Pt was telling staff the incorrect side.  All symptoms are on the left side.

## 2015-08-29 NOTE — Progress Notes (Signed)
Utilization Review Completed.[name] T9/03/2015  

## 2015-08-29 NOTE — H&P (Signed)
[facility] Admission History and Physical Service Pager: [phone]  Patient name: [name] Medical record number: [id number] Date of birth: 29-Aug-1951 Age: 64 y.o. Gender: female  Primary Care Provider: Ronnie Doss, DO Consultants: Neurology  Code Status: FULL  Chief Complaint: feeling off balance, difficulty walking, left leg weakness and left hand numbness  Assessment and Plan: [name] AARIAN CLEAVER is a 64 y.o. female  presenting with feeling off balance, difficulty walking, left leg weakness and left hand numbness. PMH is significant for DM1, HTN, HLD, Hypothyroidism, GERD, obesity.  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 On exam no strength deficits but notes to have decreased sensation on left arm and leg as well as left forehead and mandibular region; also noted to have stuttering which is not at patient's baseline.  - Neurology consulted, appreciate recommendations  - Lipid panel  - ASA 81mg  daily (continued, poor compliance at home so will not escalate antiplatelet therapy at this point, can modify as directed by stroke MD) - Lipitor 40mg  daily (increased from 20mg  at home) - ECHO ordered - Cartoid [place] ordered  - telemetry monitoring - neuro checks q 2 hrs for 12 hrs, then q 4hrs - PT/OT/SLP consult   DM1: most recent A1c 9.5 (04/21/14) - continue using home pump - DM coordinator consult - A1c ordered - CBGs qACHS  HTN: stable  - home Hyzaar, Metoprolol Succinate, imdur held for permissive HTN for at least 24 hrs post Ischemic CVA   Hypothroidism:  - continue home Synthroid 166mcg   Chronic Back Pain:  - home Meloxicam PRN - Tylenol PRN   FEN/GI:  -Heart Healthy, carb modified  diet -Protonix 40mg   -Senna PRN   Prophylaxis: Lovenox 40mg    Disposition:  - admitted to [facility] teaching service for management of acute ischemic CVA  History of Present Illness:  [name] is a 64 y.o. female presenting with feeling off balance, difficulty walking, left leg weakness and left hand numbness since 2:30am. Woke up around 2:30 and felt very hot which usually means her CBG is high, so she checked CBG which was ~230 so she gave herself an insulin bolus. At this time, she noticed her hand left hand was numb but did not think much of it. At 5 am she woke up to take thyroid pill, and when she noticed she was off balance. She states she almost hit her head on her toilet because her left leg was weak; she never felt dizzy.  Around 6 am, called her sister to tell her something did not seem right. CBG at home was 139. At 7 decided to come to ED because it seemed like her left leg weakness was not improving. No left arm weakness or tingling. She notes of new stuttering today; this is not her baseline per patient and sister. No difficulty with word finding but states her words do not "come out properly".  Denies previous symptoms of stroke or TIA. Regarding her type 1 DM, patient states she got her first pump in 2005. Per chart review she was diagnosed in 1992 and is currently followed by [name] in [facility].     PMH DM1, HTN, HLD, Hypothyroidism, GERD, obesity   Review Of Systems: Per HPI  Otherwise 12 point review  of systems was performed and was unremarkable.  Patient Active Problem List   Diagnosis Date Noted  . CVA (cerebral vascular accident) 08/29/2015  . Preventative health care 09/30/2014  . Special screening for malignant neoplasms, colon 09/10/2014  . Diabetes 09/08/2014  . Great toe pain 07/09/2014  . Posterior tibial tendinitis of right leg 04/26/2014  . Preventive measure 08/20/2013  . Morbid obesity 05/29/2013  . Back pain without radiation  04/28/2013  . Nail dystrophy 01/10/2013  . Type 1 diabetes mellitus 01/25/2012  . Hyperlipidemia 01/25/2012  . Hypothyroidism 01/25/2012  . Hypertension 01/25/2012  . GERD (gastroesophageal reflux disease) 01/25/2012   Past Medical History: Past Medical History  Diagnosis Date  . Thyroid cancer     per pt S/p Total Thyroidectomy with Radioactive Iodine Therapy  . Hyperlipidemia   . Hypothyroidism   . Hypertension   . GERD (gastroesophageal reflux disease)   . Seasonal allergies   . Type 1 diabetes mellitus   . Hemorrhoids   . Depression   . Anemia   . Arthritis   . CHF (congestive heart failure)    Past Surgical History: Past Surgical History  Procedure Laterality Date  . Thyroidectomy  2006  . Abdominal hysterectomy  1995    partial  . Cardiac catheterization      with coronary angiogram  . Ankle fracture surgery Right   . Mini meter   09/08/2014    ELECTRONIC INSULIN PUMP  . Colonoscopy N/A 09/09/2014    Procedure: COLONOSCOPY;  Surgeon: Gatha Mayer, MD;  Location: [place];  Service: Endoscopy;  Laterality: N/A;  . Left and right heart catheterization with coronary angiogram N/A 05/14/2012    Procedure: LEFT AND RIGHT HEART CATHETERIZATION WITH CORONARY ANGIOGRAM;  Surgeon: Laverda Page, MD;  Location[facility] CATH LAB;  Service: Cardiovascular;  Laterality: N/A;  . Abdominal angiogram N/A 05/14/2012    Procedure: ABDOMINAL ANGIOGRAM;  Surgeon: Laverda Page, MD;  Location: [facility] CATH LAB;  Service: Cardiovascular;  Laterality: N/A;   Social History: Social History  Substance Use Topics  . Smoking status: Never Smoker   . Smokeless tobacco: Never Used  . Alcohol Use: No   Additional social history: none  Please also refer to relevant sections of EMR.  Family History: Family History  Problem Relation Age of Onset  . Heart disease Mother     MI  . Hyperlipidemia Mother   . Hypertension Mother   . Early death Father     Head Injury at Work  .  Hyperlipidemia Father   . Hypertension Father   . Diabetes Maternal Aunt   . Prostate cancer Paternal Uncle   . Heart disease Maternal Aunt   . Renal Disease Mother     insufficiency   Allergies and Medications: No Known Allergies No current facility-administered medications on file prior to encounter.   Current Outpatient Prescriptions on File Prior to Encounter  Medication Sig Dispense Refill  . Acetaminophen 650 MG TABS Take 1,300 mg by mouth daily as needed (for pain).    Marland Kitchen aspirin EC 81 MG tablet Take 1 tablet (81 mg total) by mouth daily. 90 tablet 4  . atorvastatin (LIPITOR) 20 MG tablet Take 1 tablet (20 mg total) by mouth daily. (Patient taking differently: Take 20 mg by mouth daily at 6 PM. ) 90 tablet 1  . Insulin Human (INSULIN PUMP) 100 unit/ml SOLN Inject 1 each into the skin continuous. Uses novolog insulin in her pump.    . isosorbide  mononitrate (IMDUR) 30 MG 24 hr tablet Take 1 tablet (30 mg total) by mouth daily. 90 tablet 3  . levothyroxine (SYNTHROID, LEVOTHROID) 150 MCG tablet Take 1 tablet (150 mcg total) by mouth daily before breakfast. 90 tablet 2  . losartan-hydrochlorothiazide (HYZAAR) 50-12.5 MG per tablet TAKE 1 TABLET DAILY 90 tablet 1  . meloxicam (MOBIC) 7.5 MG tablet TAKE 1 TABLET DAILY AS NEEDED FOR PAIN 90 tablet 1  . metoprolol succinate (TOPROL-XL) 50 MG 24 hr tablet TAKE 1 TABLET DAILY WITH OR IMMEDIATELY FOLLOWING A MEAL (REPLACES METOPROLOL TARTRATE) 90 tablet 0  . pantoprazole (PROTONIX) 40 MG tablet Take 1 tablet (40 mg total) by mouth daily. 30 tablet 12  . POTASSIUM PO Take 1 tablet by mouth daily as needed (for leg cramps). OTC      Objective: BP 128/64 mmHg  Pulse 58  Temp(Src) 97.6 F (36.4 C) (Oral)  Resp 18  Wt 267 lb (121.11 kg)  SpO2 100% Exam: GEN: NAD, middle aged female, lying in bed HEENT: Atraumatic, normocephalic, neck supple, EOMI, sclera clear, PERRL  CV: RRR, no murmurs, rubs, or gallops; carotids: no murmur noted  bilaterally; distal pulses 2+ bilaterally   Respiratory: CTAB, normal effort Abdomen: Soft, nontender, nondistended, NABS, no organomegaly MSK: extremities: mild tenderness to palpation of left calf; no erythema or edema noted  SKIN: No rash or cyanosis; warm and well-perfused PSYCH: Mood and affect euthymic, normal rate and volume of speech NEURO: Awake, alert, stuttering speech, no slurring; decreased sensation to light touch on left forehead and left mandibular area. Otherwise CN are normal. Decreased sensation in left arm and leg. 5/5 strength in upper and lower extremities bilaterally.   Labs and Imaging: CBC BMET   Recent Labs Lab 08/29/15 0830 08/29/15 0845  WBC 4.5  --   HGB 11.9* 13.9  HCT 37.1 41.0  PLT 219  --     Recent Labs Lab 08/29/15 0830 08/29/15 0845  NA 139 139  K 4.0 3.9  CL 106 106  CO2 26  --   BUN 13 16  CREATININE 0.92 0.90  GLUCOSE 137* 132*  CALCIUM 9.1  --      Ct Angio Head W/cm &/or Wo Cm  08/29/2015   CLINICAL DATA:  Stroke with cerebral ischemia. Dysphasia. Left-sided weakness.  EXAM: CT ANGIOGRAPHY HEAD  TECHNIQUE: Multidetector CT imaging of the head was performed using the standard protocol during bolus administration of intravenous contrast. Multiplanar CT image reconstructions and MIPs were obtained to evaluate the vascular anatomy.  CONTRAST:  38mL OMNIPAQUE IOHEXOL 350 MG/ML SOLN  COMPARISON:  MRI 08/29/2015  FINDINGS: CT HEAD  Brain: Mild atrophy. Mild chronic microvascular ischemic change in the white matter. Small infarct in the posterior pons seen on MRI not identified on CT. Negative for hemorrhage or mass.  Calvarium and skull base: Negative  Paranasal sinuses: Negative  Orbits: Negative  CTA HEAD  Anterior circulation: Extensive atherosclerotic calcification throughout the cavernous carotid bilaterally causing moderate to severe stenosis bilaterally. Cavernous carotid is patent bilaterally. Anterior and middle cerebral arteries patent  bilaterally without significant stenosis.  Posterior circulation: Both vertebral arteries are patent to the basilar. PICA patent bilaterally. Moderate to severe stenosis of the distal vertebral artery due to calcific stenosis at the level of the dura bilaterally. Basilar patent without stenosis. Superior cerebellar and posterior cerebral arteries patent bilaterally without stenosis.  Venous sinuses: Patent  Anatomic variants: Negative for cerebral aneurysm  Delayed phase:Normal enhancement on delayed imaging.  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 Anterior and middle cerebral arteries widely patent bilaterally  Moderate to severe stenosis distal vertebral artery bilaterally due to calcific stenosis.   Electronically Signed   By: Franchot Gallo M.D.   On: 08/29/2015 13:17   Mr Brain Wo Contrast  08/29/2015   CLINICAL DATA:  Left-sided weakness and numbness. Diabetes and hypertension.  EXAM: MRI HEAD WITHOUT CONTRAST  TECHNIQUE: Multiplanar, multiecho pulse sequences of the brain and surrounding structures were obtained without intravenous contrast.  COMPARISON:  None.  FINDINGS: Small focus of acute infarct in the dorsal brainstem in the region of the floor of the fourth ventricle. This measures approximately 3 x 4 mm and is best seen on the focused coronal diffusion-weighted sequence. No other acute infarct identified  Chronic infarct left pons. Mild chronic microvascular ischemic change in the white matter.  Negative for hemorrhage or mass lesion.  Negative for hydrocephalus.  Negative for mass or edema  Paranasal sinuses are clear.  IMPRESSION: Small acute infarct in the dorsal brainstem region of medial lemniscus.  Chronic infarct left pons   Electronically Signed   By: Franchot Gallo M.D.   On: 08/29/2015 10:26    Smiley Houseman, MD 08/29/2015, 11:51 AM PGY-1, [place] Intern pager: [phone], text pages  welcome  FPTS Upper-Level Resident Addendum  I have independently interviewed and examined the patient. I have discussed the above with the original author and agree with their documentation. My edits for correction/addition/clarification are in pink. Please see also any attending notes.   Frazier Richards, MD MPH PGY-3, [place] Service pager: [phone] (text pages welcome through [facility])

## 2015-08-29 NOTE — ED Notes (Addendum)
Pt from home for eval of right sided numbness, aphasia, and unsteady gait that she noticed this morning when she woke up, pt states she was normal last night before bed at 2200. Pt states she has fallen this morning due to unsteadiness. No weakness noted at this time. No n/v/d or fevers. Pt axo x4.

## 2015-08-29 NOTE — ED Notes (Signed)
Patient transported to MRI 

## 2015-08-30 ENCOUNTER — Inpatient Hospital Stay (HOSPITAL_COMMUNITY): Payer: BC Managed Care – PPO

## 2015-08-30 ENCOUNTER — Other Ambulatory Visit (HOSPITAL_COMMUNITY): Payer: BC Managed Care – PPO

## 2015-08-30 DIAGNOSIS — E1059 Type 1 diabetes mellitus with other circulatory complications: Secondary | ICD-10-CM

## 2015-08-30 DIAGNOSIS — E039 Hypothyroidism, unspecified: Secondary | ICD-10-CM

## 2015-08-30 DIAGNOSIS — E785 Hyperlipidemia, unspecified: Secondary | ICD-10-CM | POA: Insufficient documentation

## 2015-08-30 DIAGNOSIS — I6302 Cerebral infarction due to thrombosis of basilar artery: Secondary | ICD-10-CM

## 2015-08-30 DIAGNOSIS — E1069 Type 1 diabetes mellitus with other specified complication: Secondary | ICD-10-CM | POA: Insufficient documentation

## 2015-08-30 DIAGNOSIS — I679 Cerebrovascular disease, unspecified: Secondary | ICD-10-CM | POA: Insufficient documentation

## 2015-08-30 DIAGNOSIS — I6789 Other cerebrovascular disease: Secondary | ICD-10-CM

## 2015-08-30 LAB — LIPID PANEL
CHOL/HDL RATIO: 2.3 ratio
CHOLESTEROL: 131 mg/dL (ref 0–200)
HDL: 56 mg/dL (ref >40)
LDL Cholesterol: 61 mg/dL (ref 0–99)
TRIGLYCERIDES: 70 mg/dL (ref <150)
VLDL: 14 mg/dL (ref 0–40)

## 2015-08-30 LAB — CBC
HCT: 36.3 % (ref 36.0–46.0)
Hemoglobin: 11.5 g/dL — ABNORMAL LOW (ref 12.0–15.0)
MCH: 27.9 pg (ref 26.0–34.0)
MCHC: 31.7 g/dL (ref 30.0–36.0)
MCV: 88.1 fL (ref 78.0–100.0)
PLATELETS: 194 10*3/uL (ref 150–400)
RBC: 4.12 MIL/uL (ref 3.87–5.11)
RDW: 12.8 % (ref 11.5–15.5)
WBC: 5 10*3/uL (ref 4.0–10.5)

## 2015-08-30 LAB — BASIC METABOLIC PANEL
ANION GAP: 11 (ref 5–15)
BUN: 13 mg/dL (ref 6–20)
CALCIUM: 9.1 mg/dL (ref 8.9–10.3)
CO2: 23 mmol/L (ref 22–32)
Chloride: 102 mmol/L (ref 101–111)
Creatinine, Ser: 0.99 mg/dL (ref 0.44–1.00)
GFR, EST NON AFRICAN AMERICAN: 59 mL/min — AB (ref >60)
GLUCOSE: 363 mg/dL — AB (ref 65–99)
Potassium: 4.3 mmol/L (ref 3.5–5.1)
SODIUM: 136 mmol/L (ref 135–145)

## 2015-08-30 LAB — TSH: TSH: 0.803 u[IU]/mL (ref 0.350–4.500)

## 2015-08-30 LAB — GLUCOSE, CAPILLARY
GLUCOSE-CAPILLARY: 373 mg/dL — AB (ref 65–99)
GLUCOSE-CAPILLARY: 338 mg/dL — AB (ref 65–99)
Glucose-Capillary: 319 mg/dL — ABNORMAL HIGH (ref 65–99)
Glucose-Capillary: 291 mg/dL — ABNORMAL HIGH (ref 65–99)

## 2015-08-30 MED ORDER — IOHEXOL 350 MG/ML SOLN
50.0000 mL | Freq: Once | INTRAVENOUS | Status: AC | PRN
  Administered 2015-08-30: 50 mL via INTRAVENOUS

## 2015-08-30 MED ORDER — INSULIN PUMP
Freq: Three times a day (TID) | SUBCUTANEOUS | Status: DC
  Administered 2015-08-30: 1 via SUBCUTANEOUS
  Administered 2015-08-31 (×2): via SUBCUTANEOUS
  Filled 2015-08-30: qty 1

## 2015-08-30 MED ORDER — CLOPIDOGREL BISULFATE 75 MG PO TABS
75.0000 mg | ORAL_TABLET | Freq: Every day | ORAL | Status: DC
  Administered 2015-08-30 – 2015-08-31 (×2): 75 mg via ORAL
  Filled 2015-08-30 (×2): qty 1

## 2015-08-30 MED ORDER — RISAQUAD PO CAPS
1.0000 | ORAL_CAPSULE | Freq: Every day | ORAL | Status: DC
  Administered 2015-08-30 – 2015-08-31 (×2): 1 via ORAL
  Filled 2015-08-30 (×2): qty 1

## 2015-08-30 MED ORDER — INSULIN PUMP
1.0000 | SUBCUTANEOUS | Status: DC
  Administered 2015-08-30: 1 via SUBCUTANEOUS
  Filled 2015-08-30: qty 1

## 2015-08-30 NOTE — Evaluation (Signed)
Occupational Therapy Evaluation Patient Details Name: [name] MRN: [id number] DOB: 27-Nov-1951 Today's Date: 08/30/2015    History of Present Illness Pt admitted to hospital due to feeling off balance, difficulty walking, left leg weakness and left hand weakness. MRI revealed small acute infarct in dorsal brainstem region of medial lemiscus. PMH includes CHF, hypertension, DM and depression.    Clinical Impression   Prior to admission pt was independent in ADLs. However, avoided donning/doffing socks as well as unable to tie shoes due to inability to access feet. Pt sponge bathed due to fear of falling in shower. Pt currently at supervision level for mobility and toileting. Will follow up to address shower transfers and instructions in use of adaptive equipment. Pt educated on signs and symptoms of an onset of a CVA. Pt was able to verbalize understanding of same.Therapist recommended pt sit using pt's built-in shower seat to increase pt's safety and decrease pt's risk of falling.     Follow Up Recommendations    Home-Health Services for home modifications   Equipment Recommendations       Recommendations for Other Services       Precautions / Restrictions Precautions Precautions: Fall Precaution Comments: Fall risk due to left side weakness Restrictions Weight Bearing Restrictions: No      Mobility Bed Mobility                  Transfers Overall transfer level: Independent               General transfer comment: Pt decrease control of descend when performing stand to sit transfer to chair    Balance     Sitting balance-Leahy Scale: Good       Standing balance-Leahy Scale: Fair                              ADL Overall ADL's : Needs assistance/impaired Eating/Feeding: Independent;Sitting   Grooming: Wash/dry face;Oral care;Independent;Standing                 Lower Body Dressing Details (indicate cue type and reason): Pt  requires assistance for donning/doffing socks Toilet Transfer: Independent;Supervision/safety;Regular Toilet   Toileting- Clothing Manipulation and Hygiene: Independent;Supervision/safety;Sit to/from Nurse, children's Details (indicate cue type and reason): Pt expressed concern with shower safety Functional mobility during ADLs: Independent;Supervision/safety (Pt uses standard cane on as needed basis) General ADL Comments: Pt is unable to don/doff socks and unable to tie shoes     Vision     Perception     Praxis      Pertinent Vitals/Pain Pain Assessment: No/denies pain     Hand Dominance Right   Extremity/Trunk Assessment             Communication     Cognition Arousal/Alertness: Awake/alert Behavior During Therapy: WFL for tasks assessed/performed Overall Cognitive Status: Within Functional Limits for tasks assessed                     General Comments       Exercises       Shoulder Instructions      Home Living     Available Help at Discharge: Family;Friend(s);Available PRN/intermittently (Has a friend coming in from out of town ) Type of Home: [employer]  Prior Functioning/Environment               OT Diagnosis:     OT Problem List:     OT Treatment/Interventions:      OT Goals(Current goals can be found in the care plan section) Acute Rehab OT Goals Patient Stated Goal: Want to return home OT Goal Formulation: With patient Time For Goal Achievement: 09/13/15 Potential to Achieve Goals: Good  OT Frequency:     Barriers to D/C:            Co-evaluation              End of Session Equipment Utilized During Treatment: Gait belt Nurse Communication: Other (comment) (Pt expresses safety concerns with showering once discharged )  Activity Tolerance: Patient tolerated treatment well Patient left: in chair;with call bell/phone within reach;with nursing/sitter in  room;with chair alarm set   Time: 8309-4076 OT Time Calculation (min): 26 min Charges:  OT General Charges $OT Visit: 1 Procedure OT Evaluation $Initial OT Evaluation Tier I: 1 Procedure OT Treatments $Self Care/Home Management : 8-22 mins G-Codes:    Lin Landsman 25-Sep-2015, 3:55 PM Kendal Hymen, OTS 2015/09/25 3:55 PM

## 2015-08-30 NOTE — Evaluation (Signed)
Speech Language Pathology Evaluation Patient Details Name: [name] MRN: [id number] DOB: 11/14/1951 Today's Date: 08/30/2015 Time: 1430-1450 SLP Time Calculation (min) (ACUTE ONLY): 20 min  Problem List:  Patient Active Problem List   Diagnosis Date Noted  . Thyroid activity decreased   . CVA (cerebral vascular accident) 08/29/2015  . Preventative health care 09/30/2014  . Special screening for malignant neoplasms, colon 09/10/2014  . Diabetes 09/08/2014  . Great toe pain 07/09/2014  . Posterior tibial tendinitis of right leg 04/26/2014  . Preventive measure 08/20/2013  . Morbid obesity 05/29/2013  . Back pain without radiation 04/28/2013  . Nail dystrophy 01/10/2013  . Type 1 diabetes mellitus 01/25/2012  . Hyperlipidemia 01/25/2012  . Hypothyroidism 01/25/2012  . Hypertension 01/25/2012  . GERD (gastroesophageal reflux disease) 01/25/2012   Past Medical History:  Past Medical History  Diagnosis Date  . Thyroid cancer     per pt S/p Total Thyroidectomy with Radioactive Iodine Therapy  . Hyperlipidemia   . Hypothyroidism   . Hypertension   . GERD (gastroesophageal reflux disease)   . Seasonal allergies   . Type 1 diabetes mellitus   . Hemorrhoids   . Depression   . Anemia   . Arthritis   . CHF (congestive heart failure)    Past Surgical History:  Past Surgical History  Procedure Laterality Date  . Thyroidectomy  2006  . Abdominal hysterectomy  1995    partial  . Cardiac catheterization      with coronary angiogram  . Ankle fracture surgery Right   . Mini meter   09/08/2014    ELECTRONIC INSULIN PUMP  . Colonoscopy N/A 09/09/2014    Procedure: COLONOSCOPY;  Surgeon: Gatha Mayer, MD;  Location: [place];  Service: Endoscopy;  Laterality: N/A;  . Left and right heart catheterization with coronary angiogram N/A 05/14/2012    Procedure: LEFT AND RIGHT HEART CATHETERIZATION WITH CORONARY ANGIOGRAM;  Surgeon: Laverda Page, MD;  Location: [facility] CATH LAB;   Service: Cardiovascular;  Laterality: N/A;  . Abdominal angiogram N/A 05/14/2012    Procedure: ABDOMINAL ANGIOGRAM;  Surgeon: Laverda Page, MD;  Location: [facility] CATH LAB;  Service: Cardiovascular;  Laterality: N/A;   HPI:      Assessment / Plan / Recommendation Clinical Impression  Cognitive-linguistic evaluation complete. Cognitive function WFL with the exception of very mild short term memory impairement which patient reports is baseline. Expressive and receptive language [employer] Intermittent, inconsistent, mild stuttering noted which patient reports began wtihin a few minutes of other symptoms. With the acception of very mild lingual discoordination, no other oral motor dysfunction noted. Question if a biproduct of anxiety/stress related to current diagnosis. No acute needs indicated however recommend OP SLP f/u after d/c should symptoms persist. Educated patient and family regarding results, recommendations, and basic techniques to minimze stuttering episodes.     SLP Assessment  All further Speech Lanaguage Pathology  needs can be addressed in the next venue of care    Follow Up Recommendations  Outpatient SLP       Pertinent Vitals/Pain Pain Assessment: No/denies pain   SLP Goals     SLP Evaluation Prior Functioning  Cognitive/Linguistic Baseline: Baseline deficits Baseline deficit details: patient reports mild short term memory impairement at baseline Type of Home: House Available Help at Discharge: Family;Friend(s);Available PRN/intermittently (Has a friend coming in from out of town ) Public house manager:  (retired Music therapist)   Cognition  Overall Cognitive Status: Within Functional Limits for tasks assessed Orientation Level: Oriented X4  Comprehension  Auditory Comprehension Overall Auditory Comprehension: Appears within functional limits for tasks assessed Visual Recognition/Discrimination Discrimination: Within Function Limits Reading Comprehension Reading Status: Within  funtional limits    Expression Expression Primary Mode of Expression: Verbal Verbal Expression Overall Verbal Expression: Appears within functional limits for tasks assessed Written Expression Dominant Hand: Right   Oral / Motor Oral Motor/Sensory Function Overall Oral Motor/Sensory Function: Appears within functional limits for tasks assessed (except mild lingual discoordination w/ rapid lateral movemen) Motor Speech Overall Motor Speech: Impaired Respiration: Within functional limits Phonation: Normal Resonance: Within functional limits Articulation: Within functional limitis Intelligibility: Intelligible Motor Planning: Impaired Level of Impairment: Conversation (intermittent stuttering) Motor Speech Errors: Aware   GO   [name] [place], CCC-SLP [phone]   [name] Meryl 08/30/2015, 3:15 PM

## 2015-08-30 NOTE — Progress Notes (Signed)
STROKE TEAM PROGRESS NOTE   SUBJECTIVE (INTERVAL HISTORY) Echo just finished with patient, lab is at the bedside. No family present. Overall she feels her condition is stable. Symptoms all resolved. She admitted that her diabetes and high blood pressure not in good control, she is going to do more exercises for weight loss. Discussed with her regarding stroke AF trial and she agrees to proceed.   OBJECTIVE Temp:  [97.9 F (36.6 C)-99.1 F (37.3 C)] 97.9 F (36.6 C) (09/05 0930) Pulse Rate:  [61-84] 84 (09/05 0930) Cardiac Rhythm:  [-] Normal sinus rhythm (09/05 0751) Resp:  [16-20] 18 (09/05 0930) BP: (117-139)/(49-64) 117/49 mmHg (09/05 0930) SpO2:  [98 %-100 %] 100 % (09/05 0930)  CBC:   Recent Labs Lab 08/29/15 0830 08/29/15 0845 08/30/15 0522  WBC 4.5  --  5.0  NEUTROABS 2.4  --   --   HGB 11.9* 13.9 11.5*  HCT 37.1 41.0 36.3  MCV 88.3  --  88.1  PLT 219  --  702    Basic Metabolic Panel:   Recent Labs Lab 08/29/15 0830 08/29/15 0845 08/30/15 0522  NA 139 139 136  K 4.0 3.9 4.3  CL 106 106 102  CO2 26  --  23  GLUCOSE 137* 132* 363*  BUN 13 16 13   CREATININE 0.92 0.90 0.99  CALCIUM 9.1  --  9.1    Lipid Panel:     Component Value Date/Time   CHOL 131 08/30/2015 0022   TRIG 70 08/30/2015 0022   HDL 56 08/30/2015 0022   CHOLHDL 2.3 08/30/2015 0022   VLDL 14 08/30/2015 0022   LDLCALC 61 08/30/2015 0022   HgbA1c:  Lab Results  Component Value Date   HGBA1C 9.5 04/21/2014   Urine Drug Screen: No results found for: LABOPIA, COCAINSCRNUR, LABBENZ, AMPHETMU, THCU, LABBARB    IMAGING  Ct Angio Head W/cm &/or Wo Cm 08/29/2015   Moderate to severe stenosis of the cavernous carotid bilaterally due to calcific stenosis. Anterior and middle cerebral arteries widely patent bilaterally  Moderate to severe stenosis distal vertebral artery bilaterally due to calcific stenosis.     Mr Brain Wo Contrast 08/29/2015    Small acute infarct in the dorsal brainstem  region of medial lemniscus.  Chronic infarct left pons   Electronically Signed   By: Franchot Gallo M.D.   On: 08/29/2015 10:26   CTA neck - 1. Minimal atherosclerosis in the neck. No carotid or vertebral artery stenosis in the neck. 2. Stable visualized brain parenchyma, subcentimeter right dorsal brainstem infarct remains occult on CT.  2D echo - - Left ventricle: The cavity size was normal. Systolic function was normal. The estimated ejection fraction was in the range of 55% to 60%. Wall motion was normal; there were no regional wall motion abnormalities. - Mitral valve: Calcified annulus. There was mild regurgitation. - Left atrium: The atrium was moderately dilated. - Pulmonary arteries: PA peak pressure: 50 mm Hg (S).  PHYSICAL EXAM  Temp:  [97.5 F (36.4 C)-99.1 F (37.3 C)] 97.5 F (36.4 C) (09/05 1403) Pulse Rate:  [64-84] 71 (09/05 1403) Resp:  [16-20] 18 (09/05 1403) BP: (117-140)/(49-63) 140/49 mmHg (09/05 1403) SpO2:  [98 %-100 %] 100 % (09/05 1403)  General - Well nourished, well developed, in no apparent distress.  Ophthalmologic - Sharp disc margins OU.   Cardiovascular - Regular rate and rhythm with no murmur.  Mental Status -  Level of arousal and orientation to time, place, and person were  intact. Language including expression, naming, repetition, comprehension was assessed and found intact. Fund of Knowledge was assessed and was intact.  Cranial Nerves II - XII - II - Visual field intact OU. III, IV, VI - Extraocular movements intact. V - Facial sensation intact bilaterally. VII - Facial movement intact bilaterally. VIII - Hearing & vestibular intact bilaterally. X - Palate elevates symmetrically. XI - Chin turning & shoulder shrug intact bilaterally. XII - Tongue protrusion intact.  Motor Strength - The patient's strength was normal in all extremities and pronator drift was absent.  Bulk was normal and fasciculations were absent.   Motor Tone  - Muscle tone was assessed at the neck and appendages and was normal.  Reflexes - The patient's reflexes were 1+ in all extremities and she had no pathological reflexes.  Sensory - Light touch, temperature/pinprick were assessed and were symmetrical.    Coordination - The patient had normal movements in the hands and feet with no ataxia or dysmetria.  Tremor was absent.  Gait and Station - deferred due to fatigue.   ASSESSMENT/PLAN [name] is a 64 y.o. female with history of HTN, DM type 2, hyperlipidemia, chronic congestive heart failure, thyroid cancer s/p total thyroidectomy, depression, and GERD presenting with left arm numbness, left leg weakness, trouble walking. She did not receive IV t-PA due to delay in arrival.   Stroke:  Dorsal brainstem infarct secondary to small vessel disease source  MRI  Small dorsal brainstem infarct, chronic pontine infarct  CTA head bilateral cavernous ICA stenosis, bilateral VA stenosis  CTA neck unremarkable   2D Echo  EF 55-60%   LDL 61  HgbA1c pending  Lovenox 40 mg sq daily for VTE prophylaxis Diet heart healthy/carb modified Room service appropriate?: Yes; Fluid consistency:: Thin  aspirin 81 mg orally every day prior to admission, continued on aspirin 81 mg orally every day. Due to intracranial stenosis, recommend dural antiplatelet with aspirin and Plavix for 3 months and then Plavix alone.    Consider for Stroke AF trial. Research coordinator has been contacted to review chart.  Patient counseled to be compliant with her antithrombotic medications  Ongoing aggressive stroke risk factor management  Therapy recommendations:  pending   Disposition:  pending   Hypertension  Stable Permissive hypertension (OK if < 220/120) but gradually normalize in 5-7 days  Hyperlipidemia  Home meds:  lipitor 20, increased to 40 in hospital  LDL 61, goal < 70  Continue statin at discharge  Diabetes type I  Insulin  pump  HgbA1c pending , goal < 7.0  Uncontrolled according to CBG  Patient needs better diabetes control for stroke prevention  DM education  Other Stroke Risk Factors  Morbid Obesity, Body mass index is 41.81 kg/(m^2).   Other Active Problems  Hypothyroid. Hx thyroid cancer s/p total thyroidectomy with radioactive iodine therapy  Chronic back pain  Hospital day # 1  Neurology will sign off. Please call with questions. Pt will follow up with Dr. Erlinda Hong at [facility] in about 2 months. Thanks for the consult.  Rosalin Hawking, MD PhD Stroke Neurology 08/30/2015 5:28 PM     To contact Stroke Continuity provider, please refer to [email or web address] After hours, contact General Neurology

## 2015-08-30 NOTE — Discharge Summary (Signed)
[facility] Discharge Summary  Patient name: [name] Medical record number: [id number] Date of birth: [birth year] Age: 64 y.o. Gender: female Date of Admission: 08/29/2015  Date of Discharge: 08/31/15 Admitting Physician: Kinnie Feil, MD  Primary Care Provider: Ronnie Doss, DO Consultants: Neurology  Indication for Hospitalization: stroke workup  Discharge Diagnoses/Problem List:  CVA T1DM HTN HLD Hypothyroidism  Disposition: Home with [place], [place], and outpatient SLP.  Discharge Condition: Stable, improved  Discharge Exam:  GEN: Sitting up in chair eating breakfast, in NAD HEENT: [place]/AT, EOMI, sclera clear, MMM  CV: RRR with occasional premature beats, no m/r/g Respiratory: CTAB, normal WOB, no wheezes or crackles Abdomen: Soft, nontender, nondistended, +BS MSK: No edema NEURO: Awake, alert, oriented, occasionally stuttering, no slurring of speech, normal sensation on hands and arms bilaterally, mild tremor noted in L hand, 5/5 muscle strength in upper and lower extremities bilaterally.  SKIN: No rash or lesions.  Brief Hospital Course:  [name] is a 64 year old F with a PMH of T1DM, HTN, HLD, hypothyroidism, GERD, and obesity who presented to the ED with feeling off balance, difficulty walking, new stuttering, L leg weakness, and L hand numbness. MRI on admission showed small acute infarct in the dorsal brainstem region of the medial lemniscus, as well as a chronic infarct in the L pons. She was outside the window for tPA. She was admitted for stroke workup. Her hospital course is described by problem list below.  1. Ischemic CVA: Neurology was consulted. Lipid panel nl, TSH nl, HgbA1c 9.5. ECHO showed EF 55-60%, normal wall motion, calcified annulus on mitral valve, mild mitral regurgitation, mildly dilated L atrium, PA peak pressure 36mmHg. Neurology ordered a CT angiogram of her head and neck. CT angiogram of head showed  moderate to severe stenosis of the cavernous carotid bilaterally and moderate to severe stenosis of the distal vertebral artery bilaterally due to calcific stenosis. CT angiogram of her neck showed minimal atherosclerosis. Given her significant stenosis on CT angiogram of her head, Neuro recommended dual antiplatelet therapy with ASA and Plavix for 3 months, then Plavix alone. PT/OT/SLP were consulted. PT recommended HHPT with 24 hour supervision/assistance and a rolling walker with 5" wheels. [name] arranged for her brother and best friend to stay with her throughout the next few weeks. OT recommended HHOT. SLP recommended outpatient SLP. A referral to outpatient SLP was placed. Throughout her hospitalization, she continued to improve. On the day of discharge, her L hand numbness completely resolved and she was able to ambulate with a walker under PT supervision. We increased her Lipitor from 20mg  to 40mg , as this is recommended after an acute ischemic stroke.  2. Irregular rhythm: While in the hospital, it was noted that [name] had an irregular rhythm on exam. EKG was performed, which showed sinus rhythm, borderline T wave abnormalities, and prolonged QT which was unchanged from a previous study. A repeat EKG was performed, which showed sinus rhythm with PVCs. Her HR was in the 60s-80s throughout her admission and she did not show any signs of Afib on telemetry. No further workup was pursued while she was in the hospital.   3. T1DM: Most recent A1c was 9.5 on (4/48/16). Repeat A1c in the hospital was 9.1. She continued to use her insulin pump during her hospitalization. Her CBGs ranged from 130-338. She was seen by our diabetes coordinator who felt that she was very knowledgeable about her insulin pump. No changes were made.  4.  HTN: Her home Hyzaar, Metoprolol Succinate, and Imdur were held on admission for permissive HTN after ischemic CVA. Her systolic BPs ranged from the 120s-140s throughout  admission. Her diastolic BPs ranged from the 40s-50s near the time of discharge, so we continued to hold her home BP medications. These should be restarted as necessary in the outpatient setting.  5. Hypothyroidism: We continued her home Synthroid. TSH was within normal limits.  Issues for Follow Up:  1. Per Nephrology recommendations, [name] should take Plavix and Aspirin for 3 months, and then Plavix alone after that.  2. Pt was noted to have an irregular heart rhythm on exam. At first, we were concerned for Afib in the setting of her stroke. EKGs were reassuring and her telemetry did not show any Afib. No further workup was pursued. If she begins having palpitations, dizziness, SOB, etc, we would recommend a Holter monitor to rule out Afib.  3. Pt's systolic BPs ranged from the 120s-140s during hospitalization. On the day of discharge, her diastolic BPs were in the 53Z-76B so we instructed her to stop taking her BP medications until they are restarted as an outpatient. Please follow-up on her blood pressures and restart her BP medications as necessary.  Significant Procedures: None  Significant Labs and Imaging:   Recent Labs Lab 08/29/15 0830 08/29/15 0845 08/30/15 0522  WBC 4.5  --  5.0  HGB 11.9* 13.9 11.5*  HCT 37.1 41.0 36.3  PLT 219  --  194    Recent Labs Lab 08/24/15 1652 08/29/15 0830 08/29/15 0845 08/30/15 0522  NA 139 139 139 136  K 3.9 4.0 3.9 4.3  CL 102 106 106 102  CO2 28 26  --  23  GLUCOSE 256* 137* 132* 363*  BUN 17 13 16 13   CREATININE 1.03* 0.92 0.90 0.99  CALCIUM 8.8 9.1  --  9.1  ALKPHOS 84 86  --   --   AST 17 25  --   --   ALT 18 23  --   --   ALBUMIN 3.4* 3.3*  --   --    -MRI brain (9/4): small acute infarct in dorsal brainstem region of medial lemniscus; also notes of chronic infarct in left pons -CT angiogram head (9/4): moderate to severe stenosis of the cavernous carotid bilaterally due to calcific stenosis, moderate to severe  stenosis distal vertebral artery bilaterally -CT angiogram neck (9/5): minimal atherosclerosis, no stenosis of carotid or vertebral arteries -ECHO (9/4): EF 55-60%, wall motion nl, calcified annulus on mitral valve, mild mitral regurg, L atrium mildly dilated, PA peak pressure 59mmHg  Results/Tests Pending at Time of Discharge: None  Discharge Medications:    Medication List    ASK your doctor about these medications        Acetaminophen 650 MG Tabs  Take 1,300 mg by mouth daily as needed (for pain).     aspirin EC 81 MG tablet  Take 1 tablet (81 mg total) by mouth daily.     atorvastatin 20 MG tablet  Commonly known as:  LIPITOR  Take 1 tablet (20 mg total) by mouth daily.     beta carotene w/minerals tablet  Take 1 tablet by mouth daily.     insulin pump Soln  Inject 1 each into the skin continuous. Uses novolog insulin in her pump.     isosorbide mononitrate 30 MG 24 hr tablet  Commonly known as:  IMDUR  Take 1 tablet (30 mg total) by mouth daily.  levothyroxine 150 MCG tablet  Commonly known as:  SYNTHROID, LEVOTHROID  Take 1 tablet (150 mcg total) by mouth daily before breakfast.     losartan-hydrochlorothiazide 50-12.5 MG per tablet  Commonly known as:  HYZAAR  TAKE 1 TABLET DAILY     meloxicam 7.5 MG tablet  Commonly known as:  MOBIC  TAKE 1 TABLET DAILY AS NEEDED FOR PAIN     metoprolol succinate 50 MG 24 hr tablet  Commonly known as:  TOPROL-XL  TAKE 1 TABLET DAILY WITH OR IMMEDIATELY FOLLOWING A MEAL (REPLACES METOPROLOL TARTRATE)     pantoprazole 40 MG tablet  Commonly known as:  PROTONIX  Take 1 tablet (40 mg total) by mouth daily.     POTASSIUM PO  Take 1 tablet by mouth daily as needed (for leg cramps). OTC        Discharge Instructions: Please refer to Patient Instructions section of EMR for full details.  Patient was counseled important signs and symptoms that should prompt return to medical care, changes in medications, dietary  instructions, activity restrictions, and follow up appointments.   Follow-Up Appointments: -Hospital f/u appt on 9/8 at 2:00pm with Dr. Chrisandra Netters at the [facility], MD 08/30/2015, 5:15 PM PGY-1, [place]

## 2015-08-30 NOTE — Evaluation (Signed)
Physical Therapy Evaluation Patient Details Name: [name] MRN: [id number] DOB: 29-Dec-1950 Today's Date: 08/30/2015   History of Present Illness  Pt admitted to hospital due to feeling off balance, difficulty walking, left leg weakness and left hand weakness. MRI revealed small acute infarct in dorsal brainstem region of medial lemiscus. PMH includes CHF, hypertension, DM and depression  Clinical Impression  Pt presenting with decreased activity tolerance and impaired balance. Pt cares for 64 yo daughter and was independent without AD PTA. Pt now requires RW for safe ambulation at this time and supervision for mobility. Recommend HHPT to progress pt to amb with cane or no AD and achieve safe mod I level of function.    Follow Up Recommendations Home health PT;Supervision/Assistance - 24 hour    Equipment Recommendations  Rolling walker with 5" wheels    Recommendations for Other Services       Precautions / Restrictions Precautions Precautions: Fall Precaution Comments: Fall risk due to left side weakness Restrictions Weight Bearing Restrictions: No      Mobility  Bed Mobility               General bed mobility comments: pt up in chair  Transfers Overall transfer level: Independent Equipment used: None Transfers: Sit to/from Stand Sit to Stand: Supervision         General transfer comment: Pt decrease control of descend when performing stand to sit transfer to chair  Ambulation/Gait Ambulation/Gait assistance: Min assist Ambulation Distance (Feet): 150 Feet Assistive device: None;Straight cane Gait Pattern/deviations: Step-through pattern;Decreased stride length;Staggering left;Staggering right;Wide base of support Gait velocity: decreased   General Gait Details: pt required minA via HHA for safe ambulation due to staggerring. Pt reports "i can't walk straight, my left leg won't keep up." Pt unsafe to ambulate without AD and recommend use of RW at this  time. pt equally as unsteady with cane  Stairs Stairs: Yes Stairs assistance: Min assist Stair Management: One rail Right;With cane Number of Stairs: 2 General stair comments: pt unsteady  Wheelchair Mobility    Modified Rankin (Stroke Patients Only) Modified Rankin (Stroke Patients Only) Pre-Morbid Rankin Score: Slight disability Modified Rankin: Moderately severe disability     Balance     Sitting balance-Leahy Scale: Good       Standing balance-Leahy Scale: Fair                               Pertinent Vitals/Pain Pain Assessment: No/denies pain    Home Living Family/patient expects to be discharged to:: Private residence Living Arrangements: Children;Other (Comment) (Son, sister and grandson) Available Help at Discharge: Family;Friend(s);Available PRN/intermittently (Has a friend coming in from out of town ) Type of Home: House Home Access: Stairs to enter (two stairs prior to entry of home) Entrance Stairs-Rails: None Entrance Stairs-Number of Steps: 2 Home Layout: Two level;Able to live on main level with bedroom/bathroom Home Equipment: Kasandra Knudsen - single point;Shower seat - built in (uses cane on as needed basis)      Prior Function Level of Independence: Independent               Hand Dominance   Dominant Hand: Right    Extremity/Trunk Assessment   Upper Extremity Assessment: Overall WFL for tasks assessed           Lower Extremity Assessment: LLE deficits/detail   LLE Deficits / Details: grossly 4/5  Cervical / Trunk Assessment: Normal  Communication  Communication: No difficulties;Other (comment) (Pt states she has recently begin to stutter when speaking)  Cognition Arousal/Alertness: Awake/alert Behavior During Therapy: WFL for tasks assessed/performed Overall Cognitive Status: Within Functional Limits for tasks assessed                      General Comments      Exercises        Assessment/Plan    PT  Assessment Patient needs continued PT services  PT Diagnosis Difficulty walking;Generalized weakness   PT Problem List Decreased strength;Decreased activity tolerance;Decreased balance;Decreased mobility  PT Treatment Interventions DME instruction;Gait training;Stair training;Functional mobility training;Therapeutic activities;Therapeutic exercise;Balance training   PT Goals (Current goals can be found in the Care Plan section) Acute Rehab PT Goals Patient Stated Goal: Want to return home PT Goal Formulation: With patient Time For Goal Achievement: 09/06/15 Potential to Achieve Goals: Good    Frequency Min 4X/week   Barriers to discharge Decreased caregiver support lives with son who also had a stroke and use cane, limited ability to physically assist    Co-evaluation               End of Session Equipment Utilized During Treatment: Gait belt Activity Tolerance: Patient limited by fatigue Patient left: in chair;with call bell/phone within reach Nurse Communication: Mobility status (use of RW)         Time: 1340-1400 PT Time Calculation (min) (ACUTE ONLY): 20 min   Charges:   PT Evaluation $Initial PT Evaluation Tier I: 1 Procedure     PT G CodesKingsley Callander 08/30/2015, 4:03 PM  Kittie Plater, PT, DPT Pager #: [phone] Office #: [phone]

## 2015-08-30 NOTE — Progress Notes (Signed)
  Echocardiogram 2D Echocardiogram has been performed.  [name] 08/30/2015, 11:44 AM

## 2015-08-30 NOTE — Progress Notes (Signed)
Family Medicine Teaching Service Daily Progress Note Intern Pager: [phone]  Patient name: [name] Medical record number: [id number] Date of birth: January 06, 1951 Age: 64 y.o. Gender: female  Primary Care Provider: Ronnie Doss, DO Consultants: Neuro Code Status: FULL  Pt Overview and Major Events to Date:  None  Assessment and Plan: [name] is a 64 y.o. female presenting with feeling off balance, difficulty walking, left leg weakness and left hand numbness. PMH is significant for DM1, HTN, HLD, Hypothyroidism, GERD, obesity.  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 On exam no strength deficits but notes to have decreased sensation on left arm and leg as well as left forehead and mandibular region; also noted to have stuttering which is not at patient's baseline.  - Neurology consulted, appreciate recommendations  - Lipid panel WNL (Chol 131, TGs 70, HDL 56, LDL 61, VLDL 14, Chol/HDL ratio 2.3) - Anticoagulation with ASA 81mg  daily, per Neuro recs - Lipitor 40mg  daily (increased from 20mg  at home) - f/u ECHO - f/u Carotid U/S  - telemetry monitoring - neuro checks q 2 hrs for 12 hrs, then q 4hrs - f/u PT/OT/SLP consult, appreciate recommendations  Irregular rhythm: Pt with irregularly irregular rhythm on exam. Consistent with A-fib. - Will f/u on EKG order this morning. - ECHO as a part of stroke work-up - May need Holter monitor as an outpatient - Currently holding home Metoprolol because BPs have been WNL during hospitalization  DM1: most recent A1c 9.5 (04/21/14), CBGs ranging from 190-373 in the last 24 hours) - continue using home pump - DM coordinator consult - f/u A1c - CBGs qACHS  HTN: stable, BPs ranging from  122/63-182/72 - Home Hyzaar, Metoprolol Succinate, and Imdur were held on admission for permissive HTN post ischemic CVA, will continue to hold these because most recent BPs have been in the 120s-130s  Hypothroidism:  - continue home Synthroid 156mcg   Chronic Back Pain:  - home Meloxicam PRN - Tylenol PRN   FEN/GI:  - Heart Healthy, carb-modified diet - Protonix 40mg  - Senna PRN - Will add probiotic pill per Pt's request, as this helps her stay regular at home  PPx: Lovenox 40mg   Disposition: Per ST/OT recs  Subjective:  [name] did well overnight. She states that her L leg weakness is much improved from yesterday. She was able to walk to the bathroom. Her L hand numbness has also improved since yeShe also states that she has been unhappy with how high her blood sugars have been since hospitalization, so she turned her insulin pump back on this morning around 7am.   Objective: Temp:  [97.6 F (36.4 C)-99.1 F (37.3 C)] 98.1 F (36.7 C) (09/05 0529) Pulse Rate:  [58-82] 67 (09/05 0529) Resp:  [16-20] 18 (09/05 0529) BP: (122-182)/(49-72) 129/49 mmHg (09/05 0529) SpO2:  [98 %-100 %] 99 % (09/05 0529) Weight:  [121.11 kg (267 lb)] 121.11 kg (267 lb) (09/04 0802) Physical Exam:  GEN: Sitting up in chair, conversational, in NAD HEENT: [place]/AT, EOMI, sclera clear, MMM  CV: Irregularly irregular rhythm noted, normal rate, no m/r/g Respiratory: CTAB, normal WOB,  Abdomen: Soft, nontender, nondistended, +BS MSK: No edema NEURO: Awake, alert, oriented, occasionally stuttering, no slurring of speech, decreased sensation to touch on L hand, mild tremor noted  in L hand, 5/5 muscle strength in upper and lower extremities bilaterally.  SKIN: No rash or lesions.  Laboratory:  Recent Labs Lab 08/29/15 0830 08/29/15 0845 08/30/15 0522  WBC 4.5  --  5.0  HGB 11.9* 13.9 11.5*  HCT 37.1 41.0 36.3  PLT 219  --  194    Recent Labs Lab 08/24/15 1652 08/29/15 0830  08/29/15 0845 08/30/15 0522  NA 139 139 139 136  K 3.9 4.0 3.9 4.3  CL 102 106 106 102  CO2 28 26  --  23  BUN 17 13 16 13   CREATININE 1.03* 0.92 0.90 0.99  CALCIUM 8.8 9.1  --  9.1  PROT 6.4 6.8  --   --   BILITOT 0.3 0.5  --   --   ALKPHOS 84 86  --   --   ALT 18 23  --   --   AST 17 25  --   --   GLUCOSE 256* 137* 132* 363*    Imaging/Diagnostic Tests: -ECHO pending -Carotid U/S pending   Sela [name], MD 08/30/2015, 7:10 AM PGY-1, [place] Intern pager: [phone], text pages welcome

## 2015-08-30 NOTE — Progress Notes (Signed)
Pt has insulin pump, and orders for the pump were d/c in MAR.  MD paged to see what insulin coverage we will give the pt. Orders given for sliding scale insulin and for pt to remove the pump.  Insulin pump in pt bag with pt belongings.  Will continue to monitor.   Fredrich Romans, RN

## 2015-08-30 NOTE — Progress Notes (Signed)
Inpatient Diabetes Program Recommendations  AACE/ADA: New Consensus Statement on Inpatient Glycemic Control (2013)  Target Ranges:  Prepandial:   less than 140 mg/dL      Peak postprandial:   less than 180 mg/dL (1-2 hours)      Critically ill patients:  140 - 180 mg/dL   Reason for Visit: Diabetes Consult  Diabetes history: Type1 Outpatient Diabetes medications: Insulin Pump (see settings below) Current orders for Inpatient glycemic control: Insulin Pump per Home use  Insulin (Novolog) dosing started ~1 mo ago: 12 AM: 0.575 u 4:30 AM: 0.775 u 9 AM: 1.150 u 11 AM:0.875 u 1:30 PM:1.500 u 3 PM: 0.975 u 6 PM: 1.100 u 7:30 PM: 0.975    Pt states blood sugars were elevated d/t not bolusing for meals and correction as instructed. Very knowledgeable regarding insulin pump (has had pump > 10 years) Gave Flowsheet to record boluses and discussed with RN. Signed pt contract for insulin pump therapy. Awaiting HgbA1C results. Will follow-up in am.  Thank you. Lorenda Peck, RD, LDN, CDE Inpatient Diabetes Coordinator [phone]

## 2015-08-31 ENCOUNTER — Encounter: Payer: Self-pay | Admitting: *Deleted

## 2015-08-31 ENCOUNTER — Encounter: Payer: Self-pay | Admitting: Family Medicine

## 2015-08-31 DIAGNOSIS — E038 Other specified hypothyroidism: Secondary | ICD-10-CM

## 2015-08-31 DIAGNOSIS — Z006 Encounter for examination for normal comparison and control in clinical research program: Secondary | ICD-10-CM

## 2015-08-31 LAB — CBC
HEMATOCRIT: 37.7 % (ref 36.0–46.0)
HEMOGLOBIN: 12 g/dL (ref 12.0–15.0)
MCH: 27.9 pg (ref 26.0–34.0)
MCHC: 31.8 g/dL (ref 30.0–36.0)
MCV: 87.7 fL (ref 78.0–100.0)
Platelets: 207 10*3/uL (ref 150–400)
RBC: 4.3 MIL/uL (ref 3.87–5.11)
RDW: 12.7 % (ref 11.5–15.5)
WBC: 4.3 10*3/uL (ref 4.0–10.5)

## 2015-08-31 LAB — GLUCOSE, CAPILLARY
GLUCOSE-CAPILLARY: 179 mg/dL — AB (ref 65–99)
GLUCOSE-CAPILLARY: 263 mg/dL — AB (ref 65–99)
GLUCOSE-CAPILLARY: 258 mg/dL — AB (ref 65–99)
GLUCOSE-CAPILLARY: 62 mg/dL — AB (ref 65–99)
Glucose-Capillary: 180 mg/dL — ABNORMAL HIGH (ref 65–99)
Glucose-Capillary: 104 mg/dL — ABNORMAL HIGH (ref 65–99)

## 2015-08-31 LAB — BASIC METABOLIC PANEL
ANION GAP: 10 (ref 5–15)
BUN: 11 mg/dL (ref 6–20)
CO2: 26 mmol/L (ref 22–32)
Calcium: 9.1 mg/dL (ref 8.9–10.3)
Chloride: 103 mmol/L (ref 101–111)
Creatinine, Ser: 0.97 mg/dL (ref 0.44–1.00)
GFR calc non Af Amer: >60 mL/min (ref >60)
GLUCOSE: 200 mg/dL — AB (ref 65–99)
POTASSIUM: 3.9 mmol/L (ref 3.5–5.1)
Sodium: 139 mmol/L (ref 135–145)

## 2015-08-31 LAB — HEMOGLOBIN A1C
Hgb A1c MFr Bld: 9.1 % — ABNORMAL HIGH (ref 4.8–5.6)
Mean Plasma Glucose: 214 mg/dL

## 2015-08-31 MED ORDER — CLOPIDOGREL BISULFATE 75 MG PO TABS: 75.0000 mg | ORAL_TABLET | Freq: Every day | ORAL | Status: DC

## 2015-08-31 MED ORDER — ATORVASTATIN CALCIUM 40 MG PO TABS: 40.0000 mg | ORAL_TABLET | Freq: Every day | ORAL | Status: DC

## 2015-08-31 NOTE — Care Management Note (Signed)
Case Management Note  Patient Details  Name: [name] MRN: [id number] Date of Birth: 31-Oct-1951  Subjective/Objective:                    Action/Plan: Pt being discharged home today with Home Health PT/OT and walker. PT recommending 24 hour supervision and Pt has arranged for her brother and a friend to assist her at home. Pt was given Home Health list and she chose [facility]. Miranda with [facility] notified and accepted the referral. [facility] also notified of order for walker and Pt instructed not to leave today without her walker.  Expected Discharge Date:                  Expected Discharge Plan:  [place]  In-House Referral:     Discharge planning Services  CM Consult  Post Acute Care Choice:    Choice offered to:  Patient  DME Arranged:  [place] [facility] DME Agency:  [place] Arranged:  PT, OT [facility] Agency:  [place]  Status of Service:  Completed, signed off  Medicare Important Message Given:    Date Medicare IM Given:    Medicare IM give by:    Date Additional Medicare IM Given:    Additional Medicare Important Message give by:     If discussed at [place] of Stay Meetings, dates discussed:    Additional Comments:  Ollen Gross, RN 08/31/2015, 2:07 PM

## 2015-08-31 NOTE — Progress Notes (Signed)
Occupational Therapy Treatment Patient Details Name: [name] MRN: [id number] DOB: 04-25-1951 Today's Date: 08/31/2015    History of present illness Pt admitted to hospital due to feeling off balance, difficulty walking, left leg weakness and left hand weakness. MRI revealed small acute infarct in dorsal brainstem region of medial lemiscus. PMH includes CHF, hypertension, DM and depression   OT comments  Focus of today session including education and demonstration of using adaptive equipment such as long-handle sponge, reacher and sock-aid and shower transfer using 3-in-1 bedside commode required for ADL independence. Pt performed donning/doffing socks using sock-aid and required min verbal cues for completion. Pt performed sit<> stand transfer using 3-in-1 bedside commode requiring on supervision for safety from therapist. Pt purchased reacher for assist with LB dressing and functional activities.  Follow Up Recommendations  Home health OT    Equipment Recommendations  None recommended by OT    Recommendations for Other Services      Precautions / Restrictions Precautions Precautions: Fall Precaution Comments: Fall risk due to left side weakness Restrictions Weight Bearing Restrictions: No       Mobility Bed Mobility               General bed mobility comments: pt up in chair  Transfers Overall transfer level: Modified independent Equipment used: None Transfers: Sit to/from Stand Sit to Stand: Supervision         General transfer comment: pt with good use of UEs    Balance Overall balance assessment: Needs assistance           Standing balance-Leahy Scale: Fair                     ADL Overall ADL's : Modified independent         Upper Body Bathing: Modified independent;With adaptive equipment;Sitting (Pt utilized long-handle sponge to simulate UB bathing)   Lower Body Bathing: Modified independent;With adaptive equipment;Sit to/from  stand (Pt utilized long-handle sponge to simulate LB bathing)       Lower Body Dressing: Modified independent;With adaptive equipment;Sitting/lateral leans Lower Body Dressing Details (indicate cue type and reason): Pt donned/doffed socks using sock-aid         Tub/ Shower Transfer: Walk-in shower;Supervision/safety;Cueing for safety;3 in 1 Tub/Shower Transfer Details (indicate cue type and reason): Therapist recommended pt use hand-held shower head to increase pt's safety with showering Functional mobility during ADLs: Independent;Supervision/safety General ADL Comments: Pt purchased reacher to assist with ADLs      Vision                     Perception     Praxis      Cognition   Behavior During Therapy: [facility] for tasks assessed/performed Overall Cognitive Status: Within Functional Limits for tasks assessed                       Extremity/Trunk Assessment               Exercises     Shoulder Instructions       General Comments      Pertinent Vitals/ Pain       Pain Assessment: No/denies pain  Home Living                                          Prior Functioning/Environment  Frequency Min 2X/week     Progress Toward Goals  OT Goals(current goals can now be found in the care plan section)  Progress towards OT goals: Progressing toward goals  Acute Rehab OT Goals Patient Stated Goal: Wants to return to PLOF OT Goal Formulation: With patient Time For Goal Achievement: 09/13/15 Potential to Achieve Goals: Good ADL Goals Pt Will Perform Lower Body Bathing: with modified independence;with adaptive equipment;sit to/from stand Pt Will Perform Lower Body Dressing: with modified independence;with supervision;with adaptive equipment;sit to/from stand Pt Will Perform Tub/Shower Transfer: Shower transfer;with modified independence;with supervision;shower seat  Plan Discharge plan remains appropriate     Co-evaluation                 End of Session Equipment Utilized During Treatment: Gait belt;Other (comment) (long-handle sponge, reacher, sock-aid and BSC)   Activity Tolerance Patient tolerated treatment well   Patient Left in chair;with call bell/phone within reach;with chair alarm set;with family/visitor present   Nurse Communication          Time: 7867-6720 OT Time Calculation (min): 22 min  Charges: OT General Charges $OT Visit: 1 Procedure OT Treatments $Self Care/Home Management : 8-22 mins  Lin Landsman 08/31/2015, 1:27 PM

## 2015-08-31 NOTE — Discharge Instructions (Signed)
You were hospitalized because you were having weakness of your left leg and loss of sensation in your L hand. We did an MRI of your brain, which showed [place] that you had a small stroke. We did a lot of labs and imaging to figure out what may have caused your stroke.   The CT of your head showed that the blood vessels in the back of your head are more narrow than normal. This may have contributed to your stroke, so we will send you home with 2 medications, Aspirin and Plavix, that will help prevent another stroke from occurring. You should take the Aspirin and Plavix every day for 3 months. After that, you should just take the Plavix every day.   We also thought your diabetes may have contributed to your stroke. We think you are doing a great job managing your diabetes. We encourage you to continue to work with your primary doctor to get your blood sugars under even better control.   When you came into the hospital, we stopped your blood pressure medications to help make sure your brain was getting enough blood flow. While you were hospitalized, your blood pressures were normal. Please stop taking your blood pressure medications for now (Imdur, Hyzaar, and Toprol XL). Please talk with your primary doctor about when you should start taking these again.  We have ordered home health physical therapy and home health occupational therapy for you when you go home. We have also referred you to speech therapy clinic. They should be getting into contact with you for you to follow-up with them.  It was a pleasure taking care of you during your hospitalization!

## 2015-08-31 NOTE — Progress Notes (Signed)
Subject met inclusion and exclusion criteria for STROKE-AF Research study. The informed consent form, study requirements and expectations were reviewed with the subject and questions and concerns were addressed prior to the signing of the consent form. The subject verbalized understanding of the trail requirements. The subject agreed to participate in the STROKE-AF trial and signed the informed consent. The informed consent was obtained prior to performance of any protocol-specific procedures for the subject. A copy of the signed informed consent was given to the subject and a copy was placed in the subject's medical record.

## 2015-08-31 NOTE — Progress Notes (Signed)
Family Medicine Teaching Service Daily Progress Note Intern Pager: [phone]  Patient name: [name] Medical record number: [id number] Date of birth: 1951/08/13 Age: 64 y.o. Gender: female  Primary Care Provider: Ronnie Doss, DO Consultants: Neuro Code Status: FULL  Pt Overview and Major Events to Date:  None  Assessment and Plan: [name] is a 64 y.o. female presenting with feeling off balance, difficulty walking, left leg weakness and left hand numbness. PMH is significant for DM1, HTN, HLD, Hypothyroidism, GERD, obesity.  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 On exam no strength deficits but notes to have decreased sensation on left arm and leg as well as left forehead and mandibular region; also noted to have stuttering which is not at patient's baseline.  - Neurology consulted, appreciate recommendations - Due to intracranial stenosis, Neuro recommends dual antiplatelet therapy with ASA and Plavix 75mg  qd for 3 months, then Plavix alone. 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 - EKG (9/5) showed sinus rhythm, borderline T wave abnormalities, prolonged QT - Repeat EKG (9/5): sinus rhythm with PVCs  - Will repeat another EKG - May need Holter monitor as an outpatient  DM1: most recent A1c 9.5 (04/21/14), CBGs ranging from 291-373 in the last 24 hours) - continue using home insulin pump - DM coordinator following, appreciate recommendations - f/u A1c - CBGs qACHS  HTN: stable, BPs ranging from 130/50-141/60 over the last 24 hours. - Home Hyzaar, Metoprolol Succinate, and Imdur were held on admission for permissive HTN post ischemic CVA. - Will continue to hold BPs meds, can be restarted as needed as an outpatient.  Hypothroidism:  - Continue home Synthroid 120mcg   Chronic Back Pain:  - Home Meloxicam PRN - Tylenol PRN   FEN/GI:  - Heart Healthy, carb-modified diet - Protonix 40mg  - Senna PRN - Probiotic pill per Pt's request  PPx: Lovenox 40mg   Disposition: Home today with [place], [place], outpatient SLP.  Subjective:  Ms. Leder did well overnight. She states that her walking is about the same as yesterday. She is still a little wobbly getting to the toilet. She notes that her L hand numbness has completely resolved. She would like to go home today.  Objective: Temp:  [97.5 F (36.4 C)-99 F (37.2 C)] 98.2 F (36.8 C) (09/06 0555) Pulse Rate:  [56-84] 56 (09/06 0555) Resp:  [18] 18 (09/06 0555) BP: (117-141)/(45-60) 141/60 mmHg (09/06 0555) SpO2:  [96 %-100 %] 96 % (09/06 0555)   Physical Exam:  GEN: Sitting up in chair eating breakfast, in NAD HEENT: [place]/AT, EOMI, sclera clear, MMM  CV: RRR with  occasional premature beats, no m/r/g Respiratory: CTAB, normal WOB, no wheezes or crackles Abdomen: Soft, nontender, nondistended, +BS MSK: No edema NEURO: Awake, alert, oriented, occasionally stuttering, no slurring of speech, normal sensation on hands and arms bilaterally, mild tremor noted in L hand, 5/5 muscle  strength in upper and lower extremities bilaterally.  SKIN: No rash or lesions.  Laboratory:  Recent Labs Lab 08/29/15 0830 08/29/15 0845 08/30/15 0522  WBC 4.5  --  5.0  HGB 11.9* 13.9 11.5*  HCT 37.1 41.0 36.3  PLT 219  --  194    Recent Labs Lab 08/24/15 1652 08/29/15 0830 08/29/15 0845 08/30/15 0522  NA 139 139 139 136  K 3.9 4.0 3.9 4.3  CL 102 106 106 102  CO2 28 26  --  23  BUN 17 13 16 13   CREATININE 1.03* 0.92 0.90 0.99  CALCIUM 8.8 9.1  --  9.1  PROT 6.4 6.8  --   --   BILITOT 0.3 0.5  --   --   ALKPHOS 84 86  --   --   ALT 18 23  --   --   AST 17 25  --   --   GLUCOSE 256* 137* 132* 363*    Imaging/Diagnostic Tests: - EKG (9/5): sinus rhythm, borderline T wave abnormalities, prolonged QT - Repeat EKG (9/5): sinus rhythm with PVCs - ECHO- EF 55-60%, wall motion nl, calcified annulus on mitral valve, mild mitral regurg, L atrium mildly dilated, PA peak pressure 20mmHg - CT angiogram head (9/4)- 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

## 2015-08-31 NOTE — Care Management Note (Signed)
Case Management Note  Patient Details  Name: [name] MRN: [id number] Date of Birth: Aug 09, 1951  Subjective/Objective:                    Action/Plan: Pt admitted with CVA. Pt is from home with family. PT recommends home with [facility] services. CM will cont to follow for discharge needs.   Expected Discharge Date:                  Expected Discharge Plan:  [place]  In-House Referral:     Discharge planning Services     Post Acute Care Choice:    Choice offered to:     DME Arranged:    DME Agency:     HH Arranged:    [place] Agency:     Status of Service:  In process, will continue to follow  Medicare Important Message Given:    Date Medicare IM Given:    Medicare IM give by:    Date Additional Medicare IM Given:    Additional Medicare Important Message give by:     If discussed at [place] of Stay Meetings, dates discussed:    Additional Comments:  Ollen Gross, RN 08/31/2015, 11:44 AM

## 2015-08-31 NOTE — Progress Notes (Signed)
Physical Therapy Treatment Patient Details Name: [name] MRN: [id number] DOB: 12/12/1951 Today's Date: 08/31/2015    History of Present Illness Pt admitted to hospital due to feeling off balance, difficulty walking, left leg weakness and left hand weakness. MRI revealed small acute infarct in dorsal brainstem region of medial lemiscus. PMH includes CHF, hypertension, DM and depression    PT Comments    Spoke at length regarding home situation and "stressors" in her life. She reports she is trying to have her best friend from the [place] area come down to stay with her for 2-3 weeks but she's working on the finances to afford the train ride. Pt is in the process of adopting a 64yo girl and managing disabled son at home. Pt aware she needs to care for herself which is why her best friend is coming down. Pt instructed to use RW until HHPT works with pt and can progress her to use the cane safely. Pt agreed. Pt safe to d/c home with family and use of RW once medically stable.    Follow Up Recommendations  Home health PT;Supervision/Assistance - 24 hour     Equipment Recommendations  Rolling walker with 5" wheels    Recommendations for Other Services       Precautions / Restrictions Precautions Precautions: Fall Restrictions Weight Bearing Restrictions: No    Mobility  Bed Mobility               General bed mobility comments: pt up in chair  Transfers Overall transfer level: Modified independent Equipment used: None Transfers: Sit to/from Stand Sit to Stand: Supervision         General transfer comment: pt with good use of UEs  Ambulation/Gait Ambulation/Gait assistance: Min guard Ambulation Distance (Feet): 200 Feet Assistive device: Rolling walker (2 wheeled);None Gait Pattern/deviations: Step-through pattern Gait velocity: decrased   General Gait Details: when pt ambulating without RW pt mina due to staggering L/R. once given RW pt at supervision/min guard,  v/c's for safe walker managment due to being unfamilar. pt receptive and able to use at supervision level by end of session   Stairs Stairs: Yes Stairs assistance: Min guard Stair Management: Two rails;Step to pattern Number of Stairs: 2 General stair comments: v/c's not to use RW on stairs, pt required use of bilat rails for safe negotiation  Wheelchair Mobility    Modified Rankin (Stroke Patients Only) Modified Rankin (Stroke Patients Only) Pre-Morbid Rankin Score: Slight disability Modified Rankin: Moderately severe disability     Balance Overall balance assessment: Needs assistance           Standing balance-Leahy Scale: Fair                      Cognition Arousal/Alertness: Awake/alert Behavior During Therapy: WFL for tasks assessed/performed Overall Cognitive Status: Within Functional Limits for tasks assessed                      Exercises      General Comments        Pertinent Vitals/Pain Pain Assessment: No/denies pain    Home Living                      Prior Function            PT Goals (current goals can now be found in the care plan section) Acute Rehab PT Goals Patient Stated Goal: home Progress towards PT goals: Progressing toward  goals    Frequency  Min 4X/week    PT Plan Current plan remains appropriate    Co-evaluation             End of Session Equipment Utilized During Treatment: Gait belt Activity Tolerance: Patient limited by fatigue Patient left: in chair;with call bell/phone within reach     Time: 1035-1100 PT Time Calculation (min) (ACUTE ONLY): 25 min  Charges:  $Gait Training: 8-22 mins $Therapeutic Activity: 8-22 mins                    G Codes:      [name] 08/31/2015, 11:59 AM   [name], PT, DPT Pager #: [phone] Office #: [phone]

## 2015-08-31 NOTE — Progress Notes (Signed)
Pt ambulated with with rolling walker, stand by assist to the bathroom. Gait steady. No complaints of pain or discomfort. Pt sitting up in chair watching television. Safety measures in place. Call bell within reach. Will continue to monitor.

## 2015-08-31 NOTE — Progress Notes (Signed)
BS checked before dinner with hospital machine 62. Pt insisted on BS to be rechecked with her home machine 71. Pt asymptomatic, stated that she didn't think that her BS was really 62. Pt then ate her dinner. No noted distress.

## 2015-08-31 NOTE — Progress Notes (Signed)
Pt discharging at this time taking all personal belongings. IV discontinued, dry dressing applied. Discharge instructions provided with verbal understanding. Pt will pick up prescriptions at [facility]. Spoke with MD nurse who will be calling in. Pt made aware of follow up appts. No noted distress.

## 2015-09-01 ENCOUNTER — Other Ambulatory Visit: Payer: Self-pay | Admitting: Family Medicine

## 2015-09-01 ENCOUNTER — Encounter: Payer: Self-pay | Admitting: Family Medicine

## 2015-09-01 DIAGNOSIS — K219 Gastro-esophageal reflux disease without esophagitis: Secondary | ICD-10-CM

## 2015-09-01 LAB — GLUCOSE, CAPILLARY: GLUCOSE-CAPILLARY: 150 mg/dL — AB (ref 65–99)

## 2015-09-01 MED ORDER — RANITIDINE HCL 150 MG PO TABS: 150.0000 mg | ORAL_TABLET | Freq: Two times a day (BID) | ORAL | Status: DC

## 2015-09-01 NOTE — Progress Notes (Signed)
Spoke to patient on the phone, who reports recent hospitalization for stroke.  Now on Plavix.  Concerned because she read potential side effects with PPI.  Will dc PPI and start on Zantac for GERD.  Patient to follow up as scheduled tomorrow with Dr Ardelia Mems.  Ashly M. Lajuana Ripple, DO PGY-2, [place]

## 2015-09-02 ENCOUNTER — Ambulatory Visit (INDEPENDENT_AMBULATORY_CARE_PROVIDER_SITE_OTHER): Payer: BC Managed Care – PPO | Admitting: Family Medicine

## 2015-09-02 ENCOUNTER — Encounter: Payer: Self-pay | Admitting: Family Medicine

## 2015-09-02 VITALS — BP 144/84 | HR 77 | Temp 97.6°F | Ht 67.0 in | Wt 269.2 lb

## 2015-09-02 DIAGNOSIS — I10 Essential (primary) hypertension: Secondary | ICD-10-CM | POA: Diagnosis not present

## 2015-09-02 DIAGNOSIS — Z09 Encounter for follow-up examination after completed treatment for conditions other than malignant neoplasm: Secondary | ICD-10-CM | POA: Diagnosis not present

## 2015-09-02 DIAGNOSIS — I639 Cerebral infarction, unspecified: Secondary | ICD-10-CM

## 2015-09-02 MED ORDER — LOSARTAN POTASSIUM-HCTZ 50-12.5 MG PO TABS: 1.0000 | ORAL_TABLET | Freq: Every day | ORAL | Status: DC

## 2015-09-02 MED ORDER — ACETAMINOPHEN 500 MG PO TABS: 500.0000 mg | ORAL_TABLET | Freq: Three times a day (TID) | ORAL | Status: DC | PRN

## 2015-09-02 MED ORDER — ATORVASTATIN CALCIUM 40 MG PO TABS: 40.0000 mg | ORAL_TABLET | Freq: Every day | ORAL | Status: DC

## 2015-09-02 NOTE — Assessment & Plan Note (Signed)
Doing well post-hospitalization. Today we tied up a couple loose ends: - stop meloxicam, switch to tylenol 500mg  1-2 tabs q8h prn (safer with cardiovascular disease) - sent in new rx for lipitor 40mg  daily since pt not sure she's taking that - restarted one of her home BP meds (losartan-HCTZ) - encouraged her to specifically ask physical therapists which assistive device (cane/walker) is best for her - f/u with PCP in 2 weeks for BP recheck & chronic medical issues

## 2015-09-02 NOTE — Progress Notes (Signed)
Patient ID: [name], female   DOB: [birth year], 64 y.o.   MRN: [id number]  HPI:  [name] presents for hospital follow up. She was hospitalized from 08/29/15 to 08/31/15 with acute stroke. Started on plavix & aspirin. BP meds held due to normal BP's. lipitor increased to 40mg .  Pt now reports she feels well. Has been visited by home health PT, with plans for [facility] OT to see her as well. Ambulating primarily with cane, but also with walker some. Hand doing better, numbness in improved. Weakness in leg improved as well.  Has question about whether it is safe for her to be on plavix and meloxicam together. Does not take meloxicam very often, but when she does it is for arthritis in back. Tylenol 650mg  pills does not always help with arthritis, wants to know what else she might be able to take if not on meloxicam.  Has not been taking increased dose of lipitor, thinks she's still on the 20mg  pills. Had transient irregular heart rhythm on exam during hospitalization, without evidence of afib on telemetry or EKG's. Pt denies any dizziness or palpitations. Also denies swelling, dyspnea, or chest pain.  ROS: See HPI.  [place]:  Retired Music therapist.  Hx T1DM, sees endocrinology and has insulin pump. Also hx of GERD, HLD, HTN, hypothyroidism, morbid obesity  PHYSICAL EXAM: BP 148/60 mmHg  Pulse 77  Temp(Src) 97.6 F (36.4 C) (Oral)  Ht 5\' 7"  (1.702 m)  Wt 269 lb 3.2 oz (122.108 kg)  BMI 42.15 kg/m2  BP repeat: 144/84 Gen: NAD, pleasant, cooperative HEENT: NCAT. Face symmetric Heart: RRR no murmur Lungs: CTAB NWOB Neuro: grossly nonfocal, speech normal, follows commands Ext: No appreciable lower extremity edema bilaterally   ASSESSMENT/PLAN:  Hypertension BP elevated above goal x 2 today. Will restart losartan-HCTZ combo pill. Return in 2 weeks for visit with PCP to recheck BP. Needs BMET that visit.  CVA (cerebral vascular accident) Doing well post-hospitalization. Today we tied  up a couple loose ends: - stop meloxicam, switch to tylenol 500mg  1-2 tabs q8h prn (safer with cardiovascular disease) - sent in new rx for lipitor 40mg  daily since pt not sure she's taking that - restarted one of her home BP meds (losartan-HCTZ) - encouraged her to specifically ask physical therapists which assistive device (cane/walker) is best for her - f/u with PCP in 2 weeks for BP recheck & chronic medical issues   FOLLOW UP: F/u in 2 weeks with PCP for BP check & BMET  [name], [place]

## 2015-09-02 NOTE — Assessment & Plan Note (Signed)
BP elevated above goal x 2 today. Will restart losartan-HCTZ combo pill. Return in 2 weeks for visit with PCP to recheck BP. Needs BMET that visit.

## 2015-09-02 NOTE — Patient Instructions (Addendum)
Make sure you're taking lipitor 40mg  daily Stop meloxicam.  Sent in extra strength tylenol for you (500mg  tablets). Take 1-2 tablets every 8 hours as needed for pain. Restart hyzaar (losartan-HCTZ). Sent this in to your pharmacy.  Follow up with Dr. Lajuana Ripple in 2 weeks for BP check & labs.  Be well, Dr. Ardelia Mems

## 2015-09-03 ENCOUNTER — Other Ambulatory Visit: Payer: Self-pay

## 2015-09-03 NOTE — Patient Outreach (Signed)
Patient does not want to receive [facility] calls for stroke. She thanked our "wonderful health system" and is excited to be a part of the stroke research project.

## 2015-09-09 ENCOUNTER — Encounter: Payer: Self-pay | Admitting: Family Medicine

## 2015-09-09 NOTE — Telephone Encounter (Signed)
Her blood pressure 188/89 per PT this morning Pt is worried.  She doesn't know if she should go back to metropolol and isosorbide? She doesn't want the BP to continue to creep up after having a stroke 2 weeks ago

## 2015-09-10 ENCOUNTER — Encounter: Payer: Self-pay | Admitting: Family Medicine

## 2015-09-13 ENCOUNTER — Encounter: Payer: Self-pay | Admitting: Family Medicine

## 2015-09-13 ENCOUNTER — Other Ambulatory Visit: Payer: Self-pay | Admitting: Family Medicine

## 2015-09-22 ENCOUNTER — Ambulatory Visit (INDEPENDENT_AMBULATORY_CARE_PROVIDER_SITE_OTHER): Payer: BC Managed Care – PPO | Admitting: Family Medicine

## 2015-09-22 ENCOUNTER — Encounter: Payer: Self-pay | Admitting: Family Medicine

## 2015-09-22 VITALS — BP 128/66 | HR 89 | Temp 97.9°F | Wt 262.9 lb

## 2015-09-22 DIAGNOSIS — Z23 Encounter for immunization: Secondary | ICD-10-CM

## 2015-09-22 DIAGNOSIS — I6302 Cerebral infarction due to thrombosis of basilar artery: Secondary | ICD-10-CM | POA: Diagnosis not present

## 2015-09-22 DIAGNOSIS — E109 Type 1 diabetes mellitus without complications: Secondary | ICD-10-CM | POA: Diagnosis not present

## 2015-09-22 DIAGNOSIS — I10 Essential (primary) hypertension: Secondary | ICD-10-CM | POA: Diagnosis not present

## 2015-09-22 MED ORDER — LOSARTAN POTASSIUM-HCTZ 50-12.5 MG PO TABS: 1.0000 | ORAL_TABLET | Freq: Every day | ORAL | Status: DC

## 2015-09-22 NOTE — Progress Notes (Signed)
    Subjective: CC: follow up visit HPI: Patient is a 64 y.o. female presenting to clinic today for f/u. Concerns today include:  1. Hypertension Blood pressure at home: 150's/80's Blood pressure today: 128/66 Meds: Compliant with Losartan/HCTZ, Plavix, ASA, Lipitor, DM meds Side effects: none ROS: Denies headache, dizziness, visual changes, nausea, vomiting, chest pain, abdominal pain or shortness of breath.  2. Diabetes:  High at home: 300 Low at home: 98 Taking medications: Insulin pump (Novolog) Side effects: none ROS: denies fever, chills, dizziness, LOC, polyuria, polydipsia, numbness or tingling in extremities or chest pain. Last eye exam: 12/2014 Last foot exam: 01/2015 Last A1c: get A1C at Endocrinologist Nephropathy screen indicated?: ARB Last flu, zoster and/or pneumovax: Flu today  3. S/p CVA (ischemic) Patient reports that she is doing well.  She has completed PT/OT.  She is going about her daily activities independently.  She reports that she has resumed driving in the supervision of a friend.  She has been compliant with ASA and Plavix.  She continues to take BP meds and Lipitor daily.  Denies headache, dizziness, vision changes, weakness, neurologic changes.  Social History Reviewed: non smoker. FamHx and MedHx updated.  Please see EMR. Health Maintenance: Flu shot due  ROS: All other systems reviewed and are negative.  Objective: Office vital signs reviewed. BP 128/66 mmHg  Pulse 89  Temp(Src) 97.9 F (36.6 C) (Oral)  Wt 262 lb 14.4 oz (119.251 kg)  Physical Examination:  General: Awake, alert, well nourished, well appearing HEENT: PERRLA, EOMI, MMM Cardio: RRR, S1S2 heard, no murmurs appreciated Pulm: CTAB, no wheezes, rhonchi or rales, normal WOB Extremities: WWP, No edema, cyanosis or clubbing; +2 pulses bilaterally MSK: Normal gait and station Neuro: Strength and sensation grossly intact, follows commands  Assessment/ Plan: 64 y.o. female  with  Hypertension Well controlled.  Imdur and BB discontinued in hospital -Continue Losartan/HCTZ, Lipitor -No red flag signs -Follow up in 3 months  Cerebral infarction due to thrombosis of basilar artery Doing well.  Completed OT/PT.  I feel that she is safe to resume normal activities -Continue BP, HLD meds.   -Will continue ASA and Plavix until December then Plavix only. -Neuro follow up in November. -Follow up PRN  Type 1 diabetes mellitus Doing ok.  Some highs in 300s -patient to follow up with Dr Chalmers Cater for DM1 medication management (using insulin pump) -Flu shot vaccine administered today    Janora Norlander, DO PGY-2, [place]

## 2015-09-22 NOTE — Patient Instructions (Signed)
Follow in Months for blood pressure or sooner if needed.  Ashly M. Lajuana Ripple, DO PGY-2, [place]

## 2015-09-22 NOTE — Assessment & Plan Note (Signed)
Doing ok.  Some highs in 300s -patient to follow up with Dr Chalmers Cater for DM1 medication management (using insulin pump) -Flu shot vaccine administered today

## 2015-09-22 NOTE — Assessment & Plan Note (Signed)
Doing well.  Completed OT/PT.  I feel that she is safe to resume normal activities -Continue BP, HLD meds.   -Will continue ASA and Plavix until December then Plavix only. -Neuro follow up in November. -Follow up PRN

## 2015-09-22 NOTE — Assessment & Plan Note (Signed)
Well controlled.  Imdur and BB discontinued in hospital -Continue Losartan/HCTZ, Lipitor -No red flag signs -Follow up in 3 months

## 2015-09-24 ENCOUNTER — Encounter: Payer: Self-pay | Admitting: Internal Medicine

## 2015-09-24 ENCOUNTER — Ambulatory Visit (INDEPENDENT_AMBULATORY_CARE_PROVIDER_SITE_OTHER): Payer: BC Managed Care – PPO | Admitting: Internal Medicine

## 2015-09-24 VITALS — BP 132/78 | HR 88 | Temp 98.1°F | Ht 67.0 in | Wt 263.3 lb

## 2015-09-24 DIAGNOSIS — S99922A Unspecified injury of left foot, initial encounter: Secondary | ICD-10-CM | POA: Diagnosis not present

## 2015-09-24 DIAGNOSIS — S99929A Unspecified injury of unspecified foot, initial encounter: Secondary | ICD-10-CM | POA: Insufficient documentation

## 2015-09-24 NOTE — Progress Notes (Signed)
Subjective: [name] is a 64 y.o. female patient of Ronnie Doss, DO, presenting for injury to her left 5th (pinky) toe.   CC: toe injury -Patient was trying to cut her toenails this morning and accidentally cut the skin on the top of her left 5th toe. -Was using a professional-grade nail cutter for thick nails. -Was started on plavix after a stroke September 2016 and was concerned bleeding wouldn't stop. -Also has T1DM and was concerned about healing.  -Bleeding stopped in less than 30 minutes after soaking her foot in salty water. -Toe is tender at cut.  -She is able to walk without any issue and put a cotton ball for cushioning in her shoe.  -Had tetanus booster in 2013.   T1DM -Followed by Endocrinologist [name].  -Last hgb A1c 9.1 on 08/29/15.  -Has had insulin pump for over 10 years.  -Started carb counting within the last month.  -Previously has met with nutritionist Dr. Jenne Campus.  -Sugars have been fluctuating, with highs in the 300s.  HTN -Holding beta blocker until patient follows up with neurologist Dr. Lajuan Lines s/p CVA.   Health Maintenance -Received flu vaccine 09/22/15  - ROS: denies lack of sensation in feet, denies poor wound healing in the past - Nonsmoker  Objective: BP 132/78 mmHg  Pulse 88  Temp(Src) 98.1 F (36.7 C) (Oral)  Ht 5' 7"  (1.702 m)  Wt 263 lb 4.8 oz (119.432 kg)  BMI 41.23 kg/m2 Gen: Overweight 64 y.o. female in no distress Cardiac: RRR, S1, S2, no murmurs, rubs or gallops Extremities: Superficial, raw area on dorsal surface of left 5th toe less than 0.25 cm, hyperpigmented scar on left shin  Assessment/Plan: [name] is a 64 y.o. female here for cut on toe. Toe injury -Provided reassurance to patient. Advised using gauze to pad area and neosporin to help healing. -Patient does not want podiatry referral at this time due to insurance. Will let clinic know when she would like referral.

## 2015-09-24 NOTE — Assessment & Plan Note (Signed)
-  Provided reassurance to patient. Advised using gauze to pad area and neosporin to help healing. -Patient does not want podiatry referral at this time due to insurance. Will let clinic know when she would like referral.

## 2015-09-24 NOTE — Patient Instructions (Signed)
[name], it was a pleasure to meet you today.  For your toe, keep the area protected with gauze and a band-aid until the area heals. You may use neosporin. Tylenol may help with discomfort. Things to look out for: swelling or redness of the area, change in color of your toe, bleeding that won't stop.  When you are ready, let us know when you would like [place] to make a referral to podiatry.  Otherwise, please see Dr. Lajuana Ripple after you've had your appointment with Dr. Lajuan Lines.

## 2015-10-01 ENCOUNTER — Encounter: Payer: Self-pay | Admitting: Family Medicine

## 2015-10-03 ENCOUNTER — Encounter: Payer: Self-pay | Admitting: Family Medicine

## 2015-10-04 ENCOUNTER — Encounter: Payer: Self-pay | Admitting: Family Medicine

## 2015-10-04 ENCOUNTER — Other Ambulatory Visit: Payer: Self-pay | Admitting: Family Medicine

## 2015-10-04 DIAGNOSIS — K219 Gastro-esophageal reflux disease without esophagitis: Secondary | ICD-10-CM

## 2015-10-04 MED ORDER — PANTOPRAZOLE SODIUM 40 MG PO TBEC
40.0000 mg | DELAYED_RELEASE_TABLET | Freq: Every day | ORAL | Status: DC
Start: 2015-10-04 — End: 2015-10-29

## 2015-10-29 ENCOUNTER — Encounter: Payer: Self-pay | Admitting: Neurology

## 2015-10-29 ENCOUNTER — Ambulatory Visit (INDEPENDENT_AMBULATORY_CARE_PROVIDER_SITE_OTHER): Payer: BC Managed Care – PPO | Admitting: Neurology

## 2015-10-29 VITALS — BP 120/75 | HR 80 | Ht 67.0 in | Wt 270.6 lb

## 2015-10-29 DIAGNOSIS — I10 Essential (primary) hypertension: Secondary | ICD-10-CM

## 2015-10-29 DIAGNOSIS — I6302 Cerebral infarction due to thrombosis of basilar artery: Secondary | ICD-10-CM

## 2015-10-29 DIAGNOSIS — I152 Hypertension secondary to endocrine disorders: Secondary | ICD-10-CM | POA: Insufficient documentation

## 2015-10-29 DIAGNOSIS — Z8679 Personal history of other diseases of the circulatory system: Secondary | ICD-10-CM

## 2015-10-29 DIAGNOSIS — E1159 Type 2 diabetes mellitus with other circulatory complications: Secondary | ICD-10-CM | POA: Diagnosis not present

## 2015-10-29 DIAGNOSIS — E785 Hyperlipidemia, unspecified: Secondary | ICD-10-CM | POA: Diagnosis not present

## 2015-10-29 HISTORY — DX: Cerebral infarction due to thrombosis of basilar artery: I63.02

## 2015-10-29 NOTE — Progress Notes (Signed)
STROKE NEUROLOGY FOLLOW UP NOTE  NAME: [name] DOB: 06-29-1951  REASON FOR VISIT: stroke follow up HISTORY FROM: pt and chart  Today we had the pleasure of seeing [name] in follow-up at our Neurology Clinic. Pt was accompanied by no one.   History Summary Ms. [name] Jerilynn Mages Knodel is a 64 y.o. female with history of HTN, DM type 2, hyperlipidemia, chronic congestive heart failure, thyroid cancer s/p total thyroidectomy, depression, and GERD was admitted on 08/29/15 for left arm numbness, left leg weakness, trouble walking. 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 CTA head and neck showed b/l cavernous ICA stenosis, and bilateral VA stenosis. TTE showed EF 55-60%. LDL 61 and A1C 9.1. Due to intracranial stenosis, she was put on dual antiplatelet and continued on statin. Her imdur and metoprolol were discontinued during admission. She was discharged with close outpt PCP follow up.   Interval History During the interval time, the patient has been doing well. No recurrent stroke like symptoms. She has been followed with PCP, continued on dual antiplatelet. She has seen Dr. Einar Gip in the past for CHF, CAD but has not followed with him in 3 years. Her TTE was EF 55-60%. Will hold off lmdur and metoprolol but recommend to follow up with cardiology. She still in insulin pump and stated that her sugar is in good control lately. Bp today 120/75.  REVIEW OF SYSTEMS: Full 14 system review of systems performed and notable only for those listed below and in HPI above, all others are negative:  Constitutional:   Cardiovascular:  Ear/Nose/Throat:   Skin:  Eyes:   Respiratory:   Gastroitestinal:   Genitourinary:  Hematology/Lymphatic:   Endocrine:  Musculoskeletal:   Allergy/Immunology:   Neurological:   Psychiatric:  Sleep:   The following represents the patient's updated allergies and side effects list: No Known Allergies  The neurologically  relevant items on the patient's problem list were reviewed on today's visit.  Neurologic Examination  A problem focused neurological exam (12 or more points of the single system neurologic examination, vital signs counts as 1 point, cranial nerves count for 8 points) was performed.  Blood pressure 120/75, pulse 80, height 5\' 7"  (1.702 m), weight 270 lb 9.6 oz (122.743 kg).  General - morbid obesity, well developed, in no apparent distress.  Ophthalmologic - Fundi not visualized due to small pupils.  Cardiovascular - Regular rate and rhythm.  Mental Status -  Level of arousal and orientation to time, place, and person were intact. Language including expression, naming, repetition, comprehension was assessed and found intact. Fund of Knowledge was assessed and was intact.  Cranial Nerves II - XII - II - Visual field intact OU. III, IV, VI - Extraocular movements intact. V - Facial sensation intact bilaterally. VII - Facial movement intact bilaterally. VIII - Hearing & vestibular intact bilaterally. X - Palate elevates symmetrically. XI - Chin turning & shoulder shrug intact bilaterally. XII - Tongue protrusion intact.  Motor Strength - The patient's strength was normal in all extremities and pronator drift was absent.  Bulk was normal and fasciculations were absent.   Motor Tone - Muscle tone was assessed at the neck and appendages and was normal.  Reflexes - The patient's reflexes were 1+ in all extremities and she had no pathological reflexes.  Sensory - Light touch, temperature/pinprick were assessed and were normal.    Coordination - The patient had normal movements in the hands and feet with no  ataxia or dysmetria.  Tremor was absent.  Gait and Station - The patient's transfers, posture, gait, station, and turns were observed as normal.  Data reviewed: I personally reviewed the images and agree with the radiology interpretations.  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 Anterior and middle cerebral arteries widely patent bilaterally Moderate to severe stenosis distal vertebral artery bilaterally due to calcific stenosis.   Mr Brain Wo Contrast 08/29/2015 Small acute infarct in the dorsal brainstem region of medial lemniscus. Chronic infarct left pons Electronically Signed By: Franchot Gallo M.D. On: 08/29/2015 10:26   CTA neck - 1. Minimal atherosclerosis in the neck. No carotid or vertebral artery stenosis in the neck. 2. Stable visualized brain parenchyma, subcentimeter right dorsal brainstem infarct remains occult on CT.  2D echo - - Left ventricle: The cavity size was normal. Systolic function was normal. The estimated ejection fraction was in the range of 55% to 60%. Wall motion was normal; there were no regional wall motion abnormalities. - Mitral valve: Calcified annulus. There was mild regurgitation. - Left atrium: The atrium was moderately dilated. - Pulmonary arteries: PA peak pressure: 50 mm Hg (S).  Component     Latest Ref Rng 08/29/2015 08/30/2015  Cholesterol     0 - 200 mg/dL  131  Triglycerides     <150 mg/dL  70  HDL Cholesterol     >40 mg/dL  56  Total CHOL/HDL Ratio       2.3  VLDL     0 - 40 mg/dL  14  LDL (calc)     0 - 99 mg/dL  61  Hemoglobin A1C     4.8 - 5.6 % 9.1 (H)   Mean Plasma Glucose      214     Assessment: As you may recall, she is a 64 y.o. 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 CTA head and neck showed b/l cavernous ICA stenosis, and bilateral VA stenosis. TTE showed EF 55-60%. LDL 61 and A1C 9.1. Due to intracranial stenosis, she was put on dual antiplatelet and continued on statin. Her imdur and metoprolol were  discontinued during admission. During the interval time, the patient has been doing well. BP 120/75 and TTE normal, will hold off lmdur and metoprolol but recommend to follow up with cardiology.   Plan:  - continue ASA and plavix for total 3 months and then plavix alone - continue lipitor for stroke prevention - recommend to continue to follow up with cardiology  - Follow up with your primary care physician for stroke risk factor modification. Recommend maintain blood pressure goal <130/80, diabetes with hemoglobin A1c goal below 6.5% and lipids with LDL cholesterol goal below 70 mg/dL.  - check BP and glucose at home - follow up in 3 months  I spent more than 25 minutes of face to face time with the patient. Greater than 50% of time was spent in counseling and coordination of care. We have discussed about cardiology follow up, medication management, and dual antiplatelet therapy.  Orders Placed This Encounter  Procedures  . Ambulatory referral to Cardiology    Referral Priority:  Routine    Referral Type:  Consultation    Referral Reason:  Specialty Services Required  Requested Specialty:  Cardiology    Number of Visits Requested:  1    Meds ordered this encounter  Medications  . ranitidine (ZANTAC) 150 MG tablet    Sig:     Patient Instructions  - continue ASA and plavix for one more month and then plavix alone - continue lipitor for stroke prevention - will recommend to continue to follow up with cardiology after getting medicare - Follow up with your primary care physician for stroke risk factor modification. Recommend maintain blood pressure goal <130/80, diabetes with hemoglobin A1c goal below 6.5% and lipids with LDL cholesterol goal below 70 mg/dL.  - check BP and glucose at home - follow up in 3 months   Rosalin Hawking, MD PhD [facility] [address] [place], [place] [address] [phone]

## 2015-10-29 NOTE — Patient Instructions (Signed)
-   continue ASA and plavix for one more month and then plavix alone - continue lipitor for stroke prevention - will recommend to continue to follow up with cardiology after getting medicare - Follow up with your primary care physician for stroke risk factor modification. Recommend maintain blood pressure goal <130/80, diabetes with hemoglobin A1c goal below 6.5% and lipids with LDL cholesterol goal below 70 mg/dL.  - check BP and glucose at home - follow up in 3 months

## 2015-11-13 ENCOUNTER — Other Ambulatory Visit: Payer: Self-pay | Admitting: Family Medicine

## 2015-12-13 ENCOUNTER — Other Ambulatory Visit: Payer: Self-pay | Admitting: Family Medicine

## 2015-12-14 LAB — HM DIABETES EYE EXAM

## 2015-12-15 ENCOUNTER — Ambulatory Visit (INDEPENDENT_AMBULATORY_CARE_PROVIDER_SITE_OTHER): Payer: BC Managed Care – PPO | Admitting: Family Medicine

## 2015-12-15 VITALS — BP 131/69 | HR 125 | Temp 98.2°F | Wt 269.8 lb

## 2015-12-15 DIAGNOSIS — L84 Corns and callosities: Secondary | ICD-10-CM | POA: Diagnosis not present

## 2015-12-15 DIAGNOSIS — E109 Type 1 diabetes mellitus without complications: Secondary | ICD-10-CM | POA: Diagnosis not present

## 2015-12-15 NOTE — Patient Instructions (Signed)
Corns and Calluses Corns are small areas of thickened skin that occur on the top, sides, or tip of a toe. They contain a cone-shaped core with a point that can press on a nerve below. This causes pain. Calluses are areas of thickened skin that can occur anywhere on the body including hands, fingers, palms, soles of the feet, and heels.Calluses are usually larger than corns.  CAUSES  Corns and calluses are caused by rubbing (friction) or pressure, such as from shoes that are too tight or do not fit properly.  RISK FACTORS Corns are more likely to develop in people who have toe deformities, such as hammer toes. Since calluses can occur with friction to any area of the skin, calluses are more likely to develop in people who:   Work with their hands.  Wear shoes that fit poorly, shoes that are too tight, or shoes that are high-heeled.  Have toes deformities. SYMPTOMS Symptoms of a corn or callus include:  A hard growth on the skin.   Pain or tenderness under the skin.   Redness and swelling.   Increased discomfort while wearing tight-fitting shoes. DIAGNOSIS  Corns and calluses may be diagnosed with a medical history and physical exam.  TREATMENT  Corns and calluses may be treated with:  Removing the cause of the friction or pressure. This may include:  Changing your shoes.  Wearing shoe inserts (orthotics) or other protective layers in your shoes, such as a corn pad.  Wearing gloves.  Medicines to help soften skin in the hardened, thickened areas.  Reducing the size of the corn or callus by removing the dead layers of skin.  Antibiotic medicines to treat infection.  Surgery, if a toe deformity is the cause. HOME CARE INSTRUCTIONS   Take medicines only as directed by your health care provider.  If you were prescribed an antibiotic, finish all of it even if you start to feel better.  Wear shoes that fit well. Avoid wearing high-heeled shoes and shoes that are too tight  or too loose.  Wear any padding, protective layers, gloves, or orthotics as directed by your health care provider.  Soak your hands or feet and then use a file or pumice stone to soften your corn or callus. Do this as directed by your health care provider.  Check your corn or callus every day for signs of infection. Watch for:  Redness, swelling, or pain.  Fluid, blood, or pus. SEEK MEDICAL CARE IF:   Your symptoms do not improve with treatment.  You have increased redness, swelling, or pain at the site of your corn or callus.  You have fluid, blood, or pus coming from your corn or callus.  You have new symptoms.   This information is not intended to replace advice given to you by your health care provider. Make sure you discuss any questions you have with your health care provider.   Document Released: 09/16/2004 Document Revised: 04/27/2015 Document Reviewed: 12/07/2014 Elsevier Interactive Patient Education 2016 [employer]  

## 2015-12-15 NOTE — Progress Notes (Signed)
   Subjective:   [name] is a 64 y.o. female with a history of DM, hypothyroidism, CVA here for dark area on heel  Patient reports that yesterday she walked up a long hill in boots that she doesn't usually wear with an insole that was not positioned correctly. This am she noticed dry skin on the back of her right heel. She peeled off the dead skin and revealed a dark area that she thought was a bruise. No trauma.   Review of Systems:  Per HPI. All other systems reviewed and are negative.   PMH, PSH, Medications, Allergies, and FmHx reviewed and updated in EMR.  Social History: never smoker  Objective:  BP 131/69 mmHg  Pulse 125  Temp(Src) 98.2 F (36.8 C) (Oral)  Wt 269 lb 12.8 oz (122.38 kg)  Gen:  64 y.o. female in NAD HEENT: NCAT, MMM, EOMI, PERRL, anicteric sclerae CV: RRR, no MRG, no JVD Resp: Non-labored, CTAB, no wheezes noted Abd: Soft, NTND, BS present, no guarding or organomegaly Ext: WWP, no edema MSK: 1x2cm area on back of right heel that is dark/thickened skin, callus Neuro: Alert and oriented, speech normal      Chemistry      Component Value Date/Time   NA 139 08/31/2015 0720   K 3.9 08/31/2015 0720   CL 103 08/31/2015 0720   CO2 26 08/31/2015 0720   BUN 11 08/31/2015 0720   CREATININE 0.97 08/31/2015 0720   CREATININE 1.03* 08/24/2015 1652      Component Value Date/Time   CALCIUM 9.1 08/31/2015 0720   ALKPHOS 86 08/29/2015 0830   AST 25 08/29/2015 0830   ALT 23 08/29/2015 0830   BILITOT 0.5 08/29/2015 0830      Lab Results  Component Value Date   WBC 4.3 08/31/2015   HGB 12.0 08/31/2015   HCT 37.7 08/31/2015   MCV 87.7 08/31/2015   PLT 207 08/31/2015   Lab Results  Component Value Date   TSH 0.803 08/30/2015   Lab Results  Component Value Date   HGBA1C 9.1* 08/29/2015   Assessment & Plan:     [name] is a 64 y.o. female here for foot lesion  Pre-ulcerative corn or callous Callus on back of right heel after  walking in boots with malpositioned insole, no sign of ulceration or infection - rec warm soaks, dead skin removal and emollients - stressed importance of supportive and well-fitting footwear - f/u in January with PCP to recheck given diabetes   Beverlyn Roux, MD, MPH [facility] PGY-3 12/15/2015 1:32 PM

## 2015-12-15 NOTE — Assessment & Plan Note (Addendum)
Callus on back of right heel after walking in boots with malpositioned insole, no sign of ulceration or infection - rec warm soaks, dead skin removal and emollients - stressed importance of supportive and well-fitting footwear - f/u in January with PCP to recheck given diabetes

## 2015-12-22 ENCOUNTER — Encounter: Payer: Self-pay | Admitting: Family Medicine

## 2015-12-23 ENCOUNTER — Other Ambulatory Visit: Payer: Self-pay | Admitting: Family Medicine

## 2015-12-23 DIAGNOSIS — E890 Postprocedural hypothyroidism: Secondary | ICD-10-CM

## 2015-12-23 MED ORDER — LEVOTHYROXINE SODIUM 150 MCG PO TABS: 150.0000 ug | ORAL_TABLET | Freq: Every day | ORAL | Status: DC

## 2015-12-30 ENCOUNTER — Encounter: Payer: Self-pay | Admitting: *Deleted

## 2015-12-30 DIAGNOSIS — Z006 Encounter for examination for normal comparison and control in clinical research program: Secondary | ICD-10-CM

## 2015-12-30 NOTE — Progress Notes (Signed)
STROKE-AF Research telephone call to request an additional HIPAA [employer] Consent signature. Patient very willing to sign ICF and mail in enclosed envelope. Patient is doing extremely well and very appreciative for phone call. Questions encouraged and answered.

## 2016-01-09 ENCOUNTER — Encounter: Payer: Self-pay | Admitting: Family Medicine

## 2016-01-11 ENCOUNTER — Other Ambulatory Visit: Payer: Self-pay | Admitting: *Deleted

## 2016-01-12 ENCOUNTER — Encounter: Payer: Self-pay | Admitting: Cardiology

## 2016-01-12 ENCOUNTER — Ambulatory Visit (INDEPENDENT_AMBULATORY_CARE_PROVIDER_SITE_OTHER): Payer: Medicare Other | Admitting: Cardiology

## 2016-01-12 ENCOUNTER — Encounter: Payer: Self-pay | Admitting: *Deleted

## 2016-01-12 VITALS — BP 124/70 | HR 72 | Ht 67.5 in | Wt 270.0 lb

## 2016-01-12 DIAGNOSIS — I10 Essential (primary) hypertension: Secondary | ICD-10-CM

## 2016-01-12 DIAGNOSIS — I6302 Cerebral infarction due to thrombosis of basilar artery: Secondary | ICD-10-CM

## 2016-01-12 DIAGNOSIS — E1159 Type 2 diabetes mellitus with other circulatory complications: Secondary | ICD-10-CM

## 2016-01-12 DIAGNOSIS — E785 Hyperlipidemia, unspecified: Secondary | ICD-10-CM | POA: Diagnosis not present

## 2016-01-12 DIAGNOSIS — I272 Other secondary pulmonary hypertension: Secondary | ICD-10-CM | POA: Diagnosis not present

## 2016-01-12 DIAGNOSIS — IMO0002 Reserved for concepts with insufficient information to code with codable children: Secondary | ICD-10-CM

## 2016-01-12 NOTE — Progress Notes (Signed)
Cardiology Office Note    Date:  01/12/2016   ID:  [name], DOB 04-Jul-1951, MRN [id number]  PCP:  Ronnie Doss, DO  Cardiologist:   Candee Furbish, MD   Chief Complaint  Patient presents with  . other    hx of chf    History of Present Illness:  [name] is a 65 y.o. female  Here for evaluation of heart failure. In review of prior notes, she had seen Dr. Einar Gip in the past for both CHF as well as CAD but has not seen him in a few years. Echocardiogram has shown normal ejection fraction of 55-60%. Both Imdur and metoprolol were held awaiting recommendation from cardiology.   She has a history of hypertension,  Morbid obesity,type 2 diabetes, hyperlipidemia , thyroidectomy following thyroid cancer. She also has carotid artery disease. She was put on dual antiplatelet therapy because of intracranial stenosis.  DOE when walking from garage.  Has had difficulty losing weight in the past. She has been maintaining around 270 pounds.   Extensive review of medical records performed, cardiac catheterization in 2013 was reassuring with no coronary disease, normal ejection fraction. Her pulmonary pressures were moderately elevated secondary to obesity. Echocardiogram recently in the setting of stroke shows similar findings.    Past Medical History  Diagnosis Date  . Thyroid cancer ([place])     per pt S/p Total Thyroidectomy with Radioactive Iodine Therapy  . Hyperlipidemia   . Hypothyroidism   . Hypertension   . GERD (gastroesophageal reflux disease)   . Seasonal allergies   . Type 1 diabetes mellitus ([place])   . Hemorrhoids   . Depression   . Anemia   . Arthritis   . CHF (congestive heart failure) ([place])   . Stroke [facility])     Past Surgical History  Procedure Laterality Date  . Thyroidectomy  2006  . Abdominal hysterectomy  1995    partial  . Cardiac catheterization      with coronary angiogram  . Ankle fracture surgery Right   . Mini meter   09/08/2014   ELECTRONIC INSULIN PUMP  . Colonoscopy N/A 09/09/2014    Procedure: COLONOSCOPY;  Surgeon: Gatha Mayer, MD;  Location: [facility];  Service: Endoscopy;  Laterality: N/A;  . Left and right heart catheterization with coronary angiogram N/A 05/14/2012    Procedure: LEFT AND RIGHT HEART CATHETERIZATION WITH CORONARY ANGIOGRAM;  Surgeon: Laverda Page, MD;  Location: [facility] CATH LAB;  Service: Cardiovascular;  Laterality: N/A;  . Abdominal angiogram N/A 05/14/2012    Procedure: ABDOMINAL ANGIOGRAM;  Surgeon: Laverda Page, MD;  Location: [facility] CATH LAB;  Service: Cardiovascular;  Laterality: N/A;    Outpatient Prescriptions Prior to Visit  Medication Sig Dispense Refill  . acetaminophen (TYLENOL) 500 MG tablet Take 1-2 tablets (500-1,000 mg total) by mouth every 8 (eight) hours as needed for mild pain or moderate pain. 60 tablet 2  . atorvastatin (LIPITOR) 40 MG tablet Take 1 tablet (40 mg total) by mouth daily. 90 tablet 2  . beta carotene w/minerals (OCUVITE) tablet Take 1 tablet by mouth daily.    . clopidogrel (PLAVIX) 75 MG tablet TAKE 1 TABLET(75 MG) BY MOUTH DAILY 90 tablet 3  . levothyroxine (SYNTHROID, LEVOTHROID) 150 MCG tablet Take 1 tablet (150 mcg total) by mouth daily before breakfast. 90 tablet 2  . losartan-hydrochlorothiazide (HYZAAR) 50-12.5 MG tablet Take 1 tablet by mouth daily. 90 tablet 1  . POTASSIUM PO Take 1 tablet by mouth  daily as needed (for leg cramps). OTC    . ranitidine (ZANTAC) 150 MG tablet Take 150 mg by mouth daily.     Marland Kitchen aspirin 81 MG tablet Take 81 mg by mouth daily.    . Insulin Human (INSULIN PUMP) 100 unit/ml SOLN Inject 1 each into the skin continuous. Uses novolog insulin in her pump.    . metoprolol succinate (TOPROL-XL) 50 MG 24 hr tablet Take 50 mg by mouth daily. Take with or immediately following a meal.    . NOVOLOG 100 UNIT/ML injection as directed.      No facility-administered medications prior to visit.     Allergies:   Review of patient's  allergies indicates no known allergies.   Social History   Social History  . Marital Status: Divorced    Spouse Name: N/A  . Number of Children: 1  . Years of Education: N/A   Occupational History  . retired Pharmacist, hospital    Social History Main Topics  . Smoking status: Never Smoker   . Smokeless tobacco: Never Used  . Alcohol Use: No  . Drug Use: No  . Sexual Activity: Not Asked   Other Topics Concern  . 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 One son, helping to care for her granddaughter. No caffeine. Updated 07/20/2014.     Family History:  The patient's family history includes Diabetes in her maternal aunt; Early death in her father; Heart disease in her maternal aunt and mother; Hyperlipidemia in her father and mother; Hypertension in her father and mother; Prostate cancer in her paternal uncle; Renal Disease in her mother; Stroke in her son.   ROS:   Please see the history of present illness.    Review of Systems  Hematologic/Lymphatic: Bruises/bleeds easily.   All other systems reviewed and are negative.   PHYSICAL EXAM:   VS:  BP 124/70 mmHg  Pulse 72  Ht 5' 7.5" (1.715 m)  Wt 270 lb (122.471 kg)  BMI 41.64 kg/m2  SpO2 99%   GEN: Well nourished, well developed, in no acute distress HEENT: normal Neck: no JVD, carotid bruits, or masses Cardiac: RRR; no murmurs, rubs, or gallops,no edema  Respiratory:  clear to auscultation bilaterally, normal work of breathing GI: soft, nontender, nondistended, + BS , obese MS: no deformity or atrophy Skin: warm and dry, no rash Neuro:  Alert and Oriented x 3, Strength and sensation are intact Psych: euthymic mood, full affect  Wt Readings from Last 3 Encounters:  01/12/16 270 lb (122.471 kg)  12/15/15 269 lb 12.8 oz (122.38 kg)  10/29/15 270 lb 9.6 oz (122.743 kg)      Studies/Labs Reviewed:    EKG:   09/01/2015-sinus rhythm, nonspecific T-wave change reviewed  Recent  Labs: 2015-09-01: ALT 23 08/30/2015: TSH 0.803 08/31/2015: BUN 11; Creatinine, Ser 0.97; Hemoglobin 12.0; Platelets 207; Potassium 3.9; Sodium 139   Lipid Panel    Component Value Date/Time   CHOL 131 08/30/2015 0022   TRIG 70 08/30/2015 0022   HDL 56 08/30/2015 0022   CHOLHDL 2.3 08/30/2015 0022   VLDL 14 08/30/2015 0022   LDLCALC 61 08/30/2015 0022   LDLDIRECT 75 01/25/2012 1508    Additional studies/ records that were reviewed today include:   ECHO: 08/30/15 - Left ventricle: The cavity size was normal. Systolic function was normal. The estimated ejection fraction was in the range of 55% to 60%. Wall motion was normal; there were no regional wall  motion abnormalities. - Mitral valve: Calcified annulus. There was mild regurgitation. - Left atrium: The atrium was moderately dilated. - Pulmonary arteries: PA peak pressure: 50 mm Hg (S).   Cardiac catheterization 04/2012  -  No coronary artery disease  -   Moderate pulmonary hypertension    ASSESSMENT:    1. Secondary pulmonary hypertension ([place])   2. Type 2 diabetes mellitus with other circulatory complication (HCC)   3. Morbid obesity due to excess calories ([place])   4. Hyperlipidemia   5. Cerebrovascular accident (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 ([place])   6. Essential hypertension      PLAN:  In order of problems listed above:  1.  secondary pulmonary hypertension- this is seen on echocardiogram as well as demonstrated during right and left heart catheterization in 2013. Relatively unchanged since then. This is secondary to morbid obesity. We discussed at length. Continue to advocate for weight loss. I've given her a pamphlet for a program at the [facility] to help with nutrition , exercise supervised. She will need to make sure that her foot , callus is stable enough for exercise however. This is very important with her diabetes. Thankfully, her ejection fraction is normal. She does not have systolic heart failure. Her  diastolic parameters also were not significantly abnormal. Her shortness of breath is a combination of deconditioning, morbid obesity, secondary pulmonary hypertension.  I'm comfortable with her discontinuing her isosorbide since she does not have any evidence of coronary artery disease. She is not having any angina. I'm also fine with her continuing to hold her metoprolol, stop. Her blood pressure and pulse today is adequately controlled on current medications. She does not appear to be fluid overloaded on exam today. 2.  Continue to work with primary team on glucose control -callus on foot. 3.   We discussed weight loss plans. Gave her form for [employer] 4.  continue with statin therapy. 5.  Prior stroke, intracavernous disease. Continue with Plavix. She states that she has not taking aspirin , only Plavix. 6.  Blood pressure is currently under good control.    Medication Adjustments/Labs and Tests Ordered: Current medicines are reviewed at length with the patient today.  Concerns regarding medicines are outlined above.  Medication changes, Labs and Tests ordered today are listed in the Patient Instructions below. Patient Instructions  Medication Instructions:  Please stop Metoprolol and Isosorbide. Continue all other medications as listed.  Follow-Up: Follow up as needed.  If you need a refill on your cardiac medications before your next appointment, please call your pharmacy.  Thank you for choosing [facility]!!            Signed, Candee Furbish, MD  01/12/2016 10:31 AM    [facility] [address], [place], [place]  [address] Phone: [phone]; Fax: [phone]

## 2016-01-12 NOTE — Patient Instructions (Signed)
Medication Instructions:  Please stop Metoprolol and Isosorbide. Continue all other medications as listed.  Follow-Up: Follow up as needed.  If you need a refill on your cardiac medications before your next appointment, please call your pharmacy.  Thank you for choosing [facility]!!

## 2016-02-01 ENCOUNTER — Encounter: Payer: Self-pay | Admitting: Neurology

## 2016-02-01 ENCOUNTER — Ambulatory Visit (INDEPENDENT_AMBULATORY_CARE_PROVIDER_SITE_OTHER): Payer: Medicare Other | Admitting: Neurology

## 2016-02-01 VITALS — BP 154/74 | HR 63 | Ht 67.5 in | Wt 273.6 lb

## 2016-02-01 DIAGNOSIS — E785 Hyperlipidemia, unspecified: Secondary | ICD-10-CM | POA: Diagnosis not present

## 2016-02-01 DIAGNOSIS — I6302 Cerebral infarction due to thrombosis of basilar artery: Secondary | ICD-10-CM

## 2016-02-01 DIAGNOSIS — Z8679 Personal history of other diseases of the circulatory system: Secondary | ICD-10-CM

## 2016-02-01 DIAGNOSIS — I10 Essential (primary) hypertension: Secondary | ICD-10-CM

## 2016-02-01 DIAGNOSIS — E1159 Type 2 diabetes mellitus with other circulatory complications: Secondary | ICD-10-CM

## 2016-02-01 NOTE — Progress Notes (Addendum)
STROKE NEUROLOGY FOLLOW UP NOTE  NAME: [name] DOB: [birth year]  REASON FOR VISIT: stroke follow up HISTORY FROM: pt and chart  Today we had the pleasure of seeing [name] in follow-up at our Neurology Clinic. Pt was accompanied by no one.   History Summary [name] is a 65 y.o. female with history of HTN, DM type 2, hyperlipidemia, chronic congestive heart failure, thyroid cancer s/p total thyroidectomy, depression, and GERD was admitted on 08/29/15 for left arm numbness, left leg weakness, trouble walking. 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 CTA head and neck showed b/l cavernous ICA stenosis, and bilateral VA stenosis. TTE showed EF 55-60%. LDL 61 and A1C 9.1. Due to intracranial stenosis, she was put on dual antiplatelet and continued on statin. Her imdur and metoprolol were discontinued during admission. She was discharged with close outpt PCP follow up.   10/29/15 follow up - the patient has been doing well. No recurrent stroke like symptoms. She has been followed with PCP, continued on dual antiplatelet. She has seen Dr. Einar Gip in the past for CHF, CAD but has not followed with him in 3 years. Her TTE was EF 55-60%. Will hold off lmdur and metoprolol but recommend to follow up with cardiology. She still in insulin pump and stated that her sugar is in good control lately. Bp today 120/75.  Interval History During the interval time, pt has been doing well. No complains. On plavix and lipitor now. Not checking BP at home and today BP 154/74. Stated that glucose in better control. Doing exercise at home. Followed with Dr. Marlou Porch in cardiology and OK with off imdur and metoprolol.   REVIEW OF SYSTEMS: Full 14 system review of systems performed and notable only for those listed below and in HPI above, all others are negative:  Constitutional:   Cardiovascular:  Ear/Nose/Throat:   Skin:  Eyes:   Respiratory:   Gastroitestinal:    Genitourinary:  Hematology/Lymphatic:   Endocrine:  Musculoskeletal:   Allergy/Immunology:   Neurological:   Psychiatric:  Sleep:   The following represents the patient's updated allergies and side effects list: No Known Allergies  The neurologically relevant items on the patient's problem list were reviewed on today's visit.  Neurologic Examination  A problem focused neurological exam (12 or more points of the single system neurologic examination, vital signs counts as 1 point, cranial nerves count for 8 points) was performed.  Blood pressure 154/74, pulse 63, height 5' 7.5" (1.715 m), weight 273 lb 9.6 oz (124.104 kg).  General - morbid obesity, well developed, in no apparent distress.  Ophthalmologic - Fundi not visualized due to small pupils.  Cardiovascular - Regular rate and rhythm.  Mental Status -  Level of arousal and orientation to time, place, and person were intact. Language including expression, naming, repetition, comprehension was assessed and found intact. Fund of Knowledge was assessed and was intact.  Cranial Nerves II - XII - II - Visual field intact OU. III, IV, VI - Extraocular movements intact. V - Facial sensation intact bilaterally. VII - Facial movement intact bilaterally. VIII - Hearing & vestibular intact bilaterally. X - Palate elevates symmetrically. XI - Chin turning & shoulder shrug intact bilaterally. XII - Tongue protrusion intact.  Motor Strength - The patient's strength was normal in all extremities and pronator drift was absent.  Bulk was normal and fasciculations were absent.   Motor Tone - Muscle tone was assessed at the neck  and appendages and was normal.  Reflexes - The patient's reflexes were 1+ in all extremities and she had no pathological reflexes.  Sensory - Light touch, temperature/pinprick were assessed and were normal.    Coordination - The patient had normal movements in the hands and feet with no ataxia or dysmetria.   Tremor was absent.  Gait and Station - The patient's transfers, posture, gait, station, and turns were observed as normal.  Assessment mRS = 0  Data reviewed: I personally reviewed the images and agree with the radiology interpretations.  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 Anterior and middle cerebral arteries widely patent bilaterally Moderate to severe stenosis distal vertebral artery bilaterally due to calcific stenosis.   Mr Brain Wo Contrast 08/29/2015 Small acute infarct in the dorsal brainstem region of medial lemniscus. Chronic infarct left pons Electronically Signed By: Franchot Gallo M.D. On: 08/29/2015 10:26   CTA neck - 1. Minimal atherosclerosis in the neck. No carotid or vertebral artery stenosis in the neck. 2. Stable visualized brain parenchyma, subcentimeter right dorsal brainstem infarct remains occult on CT.  2D echo - - Left ventricle: The cavity size was normal. Systolic function was normal. The estimated ejection fraction was in the range of 55% to 60%. Wall motion was normal; there were no regional wall motion abnormalities. - Mitral valve: Calcified annulus. There was mild regurgitation. - Left atrium: The atrium was moderately dilated. - Pulmonary arteries: PA peak pressure: 50 mm Hg (S).  Component     Latest Ref Rng 08/29/2015 08/30/2015  Cholesterol     0 - 200 mg/dL  131  Triglycerides     <150 mg/dL  70  HDL Cholesterol     >40 mg/dL  56  Total CHOL/HDL Ratio       2.3  VLDL     0 - 40 mg/dL  14  LDL (calc)     0 - 99 mg/dL  61  Hemoglobin A1C     4.8 - 5.6 % 9.1 (H)   Mean Plasma Glucose      214     Assessment: As you may recall, she is a 65 y.o. African American female with PMH of HTN, DM type 2, HLD, chronic CHF, thyroid cancer s/p total thyroidectomy, depression, and GERD was admitted on 08/29/15 for dorsal brainstem infarct  likely due to small vessel disease. CTA head and neck showed b/l cavernous ICA stenosis, and bilateral VA stenosis. TTE showed EF 55-60%. LDL 61 and A1C 9.1. Due to intracranial stenosis, she was put on dual antiplatelet and continued on statin. Her imdur and metoprolol were discontinued during admission. During the interval time, the patient has been doing well. Followed with cardiology and off lmdur and metoprolol. Finished off DAPT, on plavix now.  Plan:  - continue plavix and lipitor for stroke prevention - Follow up with your primary care physician for stroke risk factor modification. Recommend maintain blood pressure goal 120-140/80, diabetes with hemoglobin A1c goal below 6.5% and lipids with LDL cholesterol goal below 70 mg/dL.  - check BP and glucose at home and record - healthy diet and regular exercises - follow up in 6 months.  I spent more than 25 minutes of face to face time with the patient. Greater than 50% of time was spent in counseling and coordination of care. We have discussed about BP check at home, medication compliance, and weight loss.  No  orders of the defined types were placed in this encounter.    Meds ordered this encounter  Medications  . ONETOUCH VERIO test strip    Sig: U [id number] TID    Refill:  11    Patient Instructions  - continue plavix and lipitor for stroke prevention - Follow up with your primary care physician for stroke risk factor modification. Recommend maintain blood pressure goal 120-140/80, diabetes with hemoglobin A1c goal below 6.5% and lipids with LDL cholesterol goal below 70 mg/dL.  - check BP and glucose at home and record - healthy diet and regular exercises - follow up in 6 months.    Rosalin Hawking, MD PhD [facility] [address], [place], [place] [address] [phone]

## 2016-02-01 NOTE — Patient Instructions (Signed)
-   continue plavix and lipitor for stroke prevention - Follow up with your primary care physician for stroke risk factor modification. Recommend maintain blood pressure goal 120-140/80, diabetes with hemoglobin A1c goal below 6.5% and lipids with LDL cholesterol goal below 70 mg/dL.  - check BP and glucose at home and record - healthy diet and regular exercises - follow up in 6 months.

## 2016-02-07 ENCOUNTER — Telehealth: Payer: Self-pay | Admitting: Family Medicine

## 2016-02-07 ENCOUNTER — Encounter: Payer: Self-pay | Admitting: Family Medicine

## 2016-02-07 NOTE — Telephone Encounter (Signed)
Is not feeling well, achiness, cough, some congestion. Had the flu shot but wonders if she has the flu

## 2016-02-08 NOTE — Telephone Encounter (Signed)
Mychart message sent to patient asking her to call and make an appt to be seen [facility]

## 2016-02-12 ENCOUNTER — Other Ambulatory Visit: Payer: Self-pay | Admitting: Family Medicine

## 2016-02-20 ENCOUNTER — Encounter (HOSPITAL_COMMUNITY): Payer: Self-pay | Admitting: *Deleted

## 2016-02-20 ENCOUNTER — Emergency Department (HOSPITAL_COMMUNITY): Payer: Medicare Other

## 2016-02-20 ENCOUNTER — Emergency Department (HOSPITAL_COMMUNITY)
Admission: EM | Admit: 2016-02-20 | Discharge: 2016-02-20 | Disposition: A | Discharge status: Home / Self Care | Payer: Medicare Other | Attending: Emergency Medicine | Admitting: Emergency Medicine

## 2016-02-20 DIAGNOSIS — Z8673 Personal history of transient ischemic attack (TIA), and cerebral infarction without residual deficits: Secondary | ICD-10-CM | POA: Insufficient documentation

## 2016-02-20 DIAGNOSIS — E109 Type 1 diabetes mellitus without complications: Secondary | ICD-10-CM | POA: Insufficient documentation

## 2016-02-20 DIAGNOSIS — Z794 Long term (current) use of insulin: Secondary | ICD-10-CM | POA: Diagnosis not present

## 2016-02-20 DIAGNOSIS — E039 Hypothyroidism, unspecified: Secondary | ICD-10-CM | POA: Diagnosis not present

## 2016-02-20 DIAGNOSIS — Z79899 Other long term (current) drug therapy: Secondary | ICD-10-CM | POA: Insufficient documentation

## 2016-02-20 DIAGNOSIS — M1990 Unspecified osteoarthritis, unspecified site: Secondary | ICD-10-CM | POA: Diagnosis not present

## 2016-02-20 DIAGNOSIS — I509 Heart failure, unspecified: Secondary | ICD-10-CM | POA: Insufficient documentation

## 2016-02-20 DIAGNOSIS — M79602 Pain in left arm: Secondary | ICD-10-CM

## 2016-02-20 DIAGNOSIS — M79622 Pain in left upper arm: Secondary | ICD-10-CM | POA: Diagnosis present

## 2016-02-20 DIAGNOSIS — K219 Gastro-esophageal reflux disease without esophagitis: Secondary | ICD-10-CM | POA: Insufficient documentation

## 2016-02-20 DIAGNOSIS — Z8659 Personal history of other mental and behavioral disorders: Secondary | ICD-10-CM | POA: Diagnosis not present

## 2016-02-20 DIAGNOSIS — Z9889 Other specified postprocedural states: Secondary | ICD-10-CM | POA: Insufficient documentation

## 2016-02-20 DIAGNOSIS — I10 Essential (primary) hypertension: Secondary | ICD-10-CM | POA: Insufficient documentation

## 2016-02-20 DIAGNOSIS — Z862 Personal history of diseases of the blood and blood-forming organs and certain disorders involving the immune mechanism: Secondary | ICD-10-CM | POA: Diagnosis not present

## 2016-02-20 DIAGNOSIS — Z85850 Personal history of malignant neoplasm of thyroid: Secondary | ICD-10-CM | POA: Diagnosis not present

## 2016-02-20 DIAGNOSIS — E785 Hyperlipidemia, unspecified: Secondary | ICD-10-CM | POA: Insufficient documentation

## 2016-02-20 LAB — BASIC METABOLIC PANEL
ANION GAP: 11 (ref 5–15)
BUN: 17 mg/dL (ref 6–20)
CHLORIDE: 107 mmol/L (ref 101–111)
CO2: 25 mmol/L (ref 22–32)
CREATININE: 1.06 mg/dL — AB (ref 0.44–1.00)
Calcium: 9.1 mg/dL (ref 8.9–10.3)
GFR calc non Af Amer: 54 mL/min — ABNORMAL LOW (ref >60)
Glucose, Bld: 138 mg/dL — ABNORMAL HIGH (ref 65–99)
POTASSIUM: 3.9 mmol/L (ref 3.5–5.1)
Sodium: 143 mmol/L (ref 135–145)

## 2016-02-20 LAB — CBC
HEMATOCRIT: 36.3 % (ref 36.0–46.0)
HEMOGLOBIN: 11.5 g/dL — AB (ref 12.0–15.0)
MCH: 27.9 pg (ref 26.0–34.0)
MCHC: 31.7 g/dL (ref 30.0–36.0)
MCV: 88.1 fL (ref 78.0–100.0)
Platelets: 238 10*3/uL (ref 150–400)
RBC: 4.12 MIL/uL (ref 3.87–5.11)
RDW: 12.9 % (ref 11.5–15.5)
WBC: 4 10*3/uL (ref 4.0–10.5)

## 2016-02-20 LAB — I-STAT TROPONIN, ED
TROPONIN I, POC: 0 ng/mL (ref 0.00–0.08)
Troponin i, poc: 0.01 ng/mL (ref 0.00–0.08)

## 2016-02-20 NOTE — ED Provider Notes (Signed)
CSN: [id number]     Arrival date & time 02/20/16  0554 History   First MD Initiated Contact with Patient 02/20/16 [phone]     Chief Complaint  Patient presents with  . Arm Pain     (Consider location/radiation/quality/duration/timing/severity/associated sxs/prior Treatment) HPI Comments: Woke up with it, continues intermittently throughout day, sometimes walks without pain, sometimes has pain with walking, sometimes pain with rest, spontaneous. Aching pain, 5/10 at most. No associated symptoms. Left upper arm, no radiation to chest, no shoulder pain, no neck pain Had stopped walking around [employer] due to foot ulcer, however restarted last week on Monday Used to walk 4 laps, now walked 3 and stopped prior to continuing due to feeling hot in jacket and some dyspnea, thought she was out of shape since she had not walked in some time. Arm pain did not worsen with this. No CP, no nausea. Thinks has been pushing walker more and this could be cause of pain.  Has had cough, congestion which are improving, "virus that has been going around" mild yellow sputum this AM. No fevers or chills.  Patient is a 65 y.o. female presenting with arm pain.  Arm Pain This is a new problem. Episode onset: 3 days. Episode frequency: intermittently. Pertinent negatives include no chest pain, no abdominal pain, no headaches and no shortness of breath. Nothing aggravates the symptoms. Nothing relieves the symptoms. She has tried nothing for the symptoms. The treatment provided no relief.    Past Medical History  Diagnosis Date  . Thyroid cancer ([place])     per pt S/p Total Thyroidectomy with Radioactive Iodine Therapy  . Hyperlipidemia   . Hypothyroidism   . Hypertension   . GERD (gastroesophageal reflux disease)   . Seasonal allergies   . Type 1 diabetes mellitus ([place])   . Hemorrhoids   . Depression   . Anemia   . Arthritis   . CHF (congestive heart failure) ([place])   . Stroke [facility])    Past Surgical History   Procedure Laterality Date  . Thyroidectomy  2006  . Abdominal hysterectomy  1995    partial  . Cardiac catheterization      with coronary angiogram  . Ankle fracture surgery Right   . Mini meter   09/08/2014    ELECTRONIC INSULIN PUMP  . Colonoscopy N/A 09/09/2014    Procedure: COLONOSCOPY;  Surgeon: Gatha Mayer, MD;  Location: [facility] [place];  Service: Endoscopy;  Laterality: N/A;  . Left and right heart catheterization with coronary angiogram N/A 05/14/2012    Procedure: LEFT AND RIGHT HEART CATHETERIZATION WITH CORONARY ANGIOGRAM;  Surgeon: Laverda Page, MD;  Location: [facility] CATH LAB;  Service: Cardiovascular;  Laterality: N/A;  . Abdominal angiogram N/A 05/14/2012    Procedure: ABDOMINAL ANGIOGRAM;  Surgeon: Laverda Page, MD;  Location: [facility] CATH LAB;  Service: Cardiovascular;  Laterality: N/A;   Family History  Problem Relation Age of Onset  . Heart disease Mother     MI  . Hyperlipidemia Mother   . Hypertension Mother   . Renal Disease Mother     insufficiency  . Early death Father     Head Injury at Work  . Hyperlipidemia Father   . Hypertension Father   . Diabetes Maternal Aunt   . Prostate cancer Paternal Uncle   . Heart disease Maternal Aunt   . Stroke Son    Social History  Substance Use Topics  . Smoking status: Never Smoker   . Smokeless tobacco: Never  Used  . Alcohol Use: No   OB History    No data available     Review of Systems  Constitutional: Negative for fever.  HENT: Negative for sore throat.   Eyes: Negative for visual disturbance.  Respiratory: Negative for cough and shortness of breath.   Cardiovascular: Negative for chest pain.  Gastrointestinal: Negative for nausea, vomiting, abdominal pain, diarrhea and constipation.  Genitourinary: Negative for difficulty urinating.  Musculoskeletal: Positive for myalgias. Negative for back pain and neck pain.  Skin: Negative for rash.  Neurological: Negative for syncope and headaches.       Allergies  Review of patient's allergies indicates no known allergies.  Home Medications   Prior to Admission medications   Medication Sig Start Date End Date Taking? Authorizing Provider  acetaminophen (TYLENOL) 500 MG tablet Take 1-2 tablets (500-1,000 mg total) by mouth every 8 (eight) hours as needed for mild pain or moderate pain. 09/02/15  Yes Leeanne Rio, MD  atorvastatin (LIPITOR) 40 MG tablet Take 1 tablet (40 mg total) by mouth daily. 09/02/15  Yes Leeanne Rio, MD  clopidogrel (PLAVIX) 75 MG tablet TAKE 1 TABLET(75 MG) BY MOUTH DAILY 12/13/15  Yes Ashly M Gottschalk, DO  insulin lispro (HUMALOG) 100 UNIT/ML injection Inject 10 Units into the skin 3 (three) times daily with meals.    Yes Historical Provider, MD  levothyroxine (SYNTHROID, LEVOTHROID) 150 MCG tablet Take 1 tablet (150 mcg total) by mouth daily before breakfast. 12/23/15  Yes Ashly M Gottschalk, DO  loratadine (CLARITIN) 10 MG tablet Take 10 mg by mouth daily.   Yes Historical Provider, MD  losartan-hydrochlorothiazide (HYZAAR) 50-12.5 MG tablet TAKE 1 TABLET BY MOUTH DAILY 02/14/16  Yes Ashly M Gottschalk, DO  Multiple Vitamins-Minerals (ONE-A-DAY WOMENS 50 PLUS PO) Take 1 tablet by mouth daily.   Yes Historical Provider, MD  Glory Rosebush VERIO test strip U UTD TID 01/28/16  Yes Historical Provider, MD  potassium gluconate 595 (99 K) MG TABS tablet Take 595 mg by mouth daily.   Yes Historical Provider, MD  ranitidine (ZANTAC) 150 MG tablet Take 150 mg by mouth daily.  09/01/15  Yes Historical Provider, MD   BP 146/64 mmHg  Pulse 64  Temp(Src) 97.9 F (36.6 C) (Oral)  Resp 16  Ht 5\' 6"  (1.676 m)  Wt 270 lb (122.471 kg)  BMI 43.60 kg/m2  SpO2 100% Physical Exam  Constitutional: She is oriented to person, place, and time. She appears well-developed and well-nourished. No distress.  HENT:  Head: Normocephalic and atraumatic.  Eyes: Conjunctivae and EOM are normal.  Neck: Normal range of motion.   Cardiovascular: Normal rate, regular rhythm, normal heart sounds and intact distal pulses.  Exam reveals no gallop and no friction rub.   No murmur heard. Pulmonary/Chest: Effort normal and breath sounds normal. No respiratory distress. She has no wheezes. She has no rales.  Abdominal: Soft. She exhibits no distension. There is no tenderness. There is no guarding.  Musculoskeletal: She exhibits tenderness (left upper arm). She exhibits no edema.  Neurological: She is alert and oriented to person, place, and time.  Skin: Skin is warm and dry. No rash noted. She is not diaphoretic. No erythema.  Nursing note and vitals reviewed.   ED Course  Procedures (including critical care time) Labs Review Labs Reviewed  BASIC METABOLIC PANEL - Abnormal; Notable for the following:    Glucose, Bld 138 (*)    Creatinine, Ser 1.06 (*)    GFR calc non Af Wyvonnia Lora  54 (*)    All other components within normal limits  CBC - Abnormal; Notable for the following:    Hemoglobin 11.5 (*)    All other components within normal limits  I-STAT TROPOININ, ED  Randolm Idol, ED    Imaging Review Dg Chest 2 View  02/20/2016  CLINICAL DATA:  65 year old female with cough and left arm pain. EXAM: CHEST  2 VIEW COMPARISON:  08/26/2012 and prior radiographs FINDINGS: Cardiomegaly again noted. There is no evidence of focal airspace disease, pulmonary edema, suspicious pulmonary nodule/mass, pleural effusion, or pneumothorax. No acute bony abnormalities are identified. A mid-lower thoracic compression fracture is unchanged. IMPRESSION: Cardiomegaly without evidence of acute cardiopulmonary disease. Electronically Signed   By: Margarette Canada M.D.   On: 02/20/2016 08:14   I have personally reviewed and evaluated these images and lab results as part of my medical decision-making.   EKG Interpretation   Date/Time:  Sunday February 20 2016 06:01:47 EST Ventricular Rate:  80 PR Interval:  150 QRS Duration: 92 QT Interval:   428 QTC Calculation: 493 R Axis:   19 Text Interpretation:  Sinus rhythm Prolonged QT Abnormal ECG No  significant change since last tracing Confirmed by [facility] MD, Adrien Shankar  ([id number]) on 02/20/2016 7:11:18 AM      MDM   Final diagnoses:  Left arm pain   65 year old female with a history of diabetes type 1, hypertension, hyperlipidemia, CVA presents with concern for left arm pain.  EKG was evaluated by me and showed a sinus rhythm with prolonged QTc similar to prior ECGs. Troponins negative x2.  Patient without any chest pain, no dyspnea, no nausea, no diaphoresis associated with the arm pain, and it is not exertional. She does have left arm tenderness on exam, reports she has been pushing her walker more recently, and feel symptoms are likely muscular and less likely anginal equivalent.  However, recommend follow up with her Cardiologist in the next week for evaluation, and return to the ED if she develops other new or concerning symptoms.    Gareth Morgan, MD 02/20/16 2308

## 2016-02-20 NOTE — ED Notes (Signed)
Left arm pain for past 3-4 days-- no change with exertion, pt does walk with walker -- or shopping cart when in stores-- denies increase in pain with ambulation.

## 2016-02-20 NOTE — ED Notes (Signed)
Pt states upper left arm pain "achy"  for several days for about 3 days, comes and goes, better with tylenol. No chest pain, shortness of breath, dizziness, or nausea. Denies injury.

## 2016-02-22 ENCOUNTER — Other Ambulatory Visit: Payer: Self-pay | Admitting: Family Medicine

## 2016-02-22 ENCOUNTER — Encounter: Payer: Self-pay | Admitting: Family Medicine

## 2016-02-22 DIAGNOSIS — E890 Postprocedural hypothyroidism: Secondary | ICD-10-CM

## 2016-02-22 MED ORDER — LEVOTHYROXINE SODIUM 150 MCG PO TABS: 150.0000 ug | ORAL_TABLET | Freq: Every day | ORAL | Status: DC

## 2016-03-15 ENCOUNTER — Encounter: Payer: Self-pay | Admitting: *Deleted

## 2016-03-15 DIAGNOSIS — Z006 Encounter for examination for normal comparison and control in clinical research program: Secondary | ICD-10-CM

## 2016-03-15 NOTE — Progress Notes (Signed)
STROKE-[facility] 6 month research visit completed. New Informed Consent signed by patient (IRB dated 17/FEB/2017) copy given to patient. Patient had 1 ED visit with arm discomfort, discharge to home Labs/ekg and CXR all normal. Next Research visit due in September patient informed research office will call and schedule. Questions encouraged and answered.

## 2016-04-03 ENCOUNTER — Emergency Department (HOSPITAL_COMMUNITY)
Admission: EM | Admit: 2016-04-03 | Discharge: 2016-04-04 | Disposition: A | Discharge status: Home / Self Care | Payer: Medicare Other | Attending: Emergency Medicine | Admitting: Emergency Medicine

## 2016-04-03 ENCOUNTER — Encounter (HOSPITAL_COMMUNITY): Payer: Self-pay | Admitting: Emergency Medicine

## 2016-04-03 ENCOUNTER — Emergency Department (HOSPITAL_COMMUNITY): Payer: Medicare Other

## 2016-04-03 DIAGNOSIS — I10 Essential (primary) hypertension: Secondary | ICD-10-CM | POA: Diagnosis not present

## 2016-04-03 DIAGNOSIS — E039 Hypothyroidism, unspecified: Secondary | ICD-10-CM | POA: Insufficient documentation

## 2016-04-03 DIAGNOSIS — Z85850 Personal history of malignant neoplasm of thyroid: Secondary | ICD-10-CM | POA: Diagnosis not present

## 2016-04-03 DIAGNOSIS — Y9289 Other specified places as the place of occurrence of the external cause: Secondary | ICD-10-CM | POA: Diagnosis not present

## 2016-04-03 DIAGNOSIS — K219 Gastro-esophageal reflux disease without esophagitis: Secondary | ICD-10-CM | POA: Diagnosis not present

## 2016-04-03 DIAGNOSIS — Y998 Other external cause status: Secondary | ICD-10-CM | POA: Diagnosis not present

## 2016-04-03 DIAGNOSIS — S29001A Unspecified injury of muscle and tendon of front wall of thorax, initial encounter: Secondary | ICD-10-CM | POA: Diagnosis not present

## 2016-04-03 DIAGNOSIS — Z79899 Other long term (current) drug therapy: Secondary | ICD-10-CM | POA: Diagnosis not present

## 2016-04-03 DIAGNOSIS — F329 Major depressive disorder, single episode, unspecified: Secondary | ICD-10-CM | POA: Diagnosis not present

## 2016-04-03 DIAGNOSIS — S52121A Displaced fracture of head of right radius, initial encounter for closed fracture: Secondary | ICD-10-CM | POA: Insufficient documentation

## 2016-04-03 DIAGNOSIS — Z8673 Personal history of transient ischemic attack (TIA), and cerebral infarction without residual deficits: Secondary | ICD-10-CM | POA: Diagnosis not present

## 2016-04-03 DIAGNOSIS — Z7902 Long term (current) use of antithrombotics/antiplatelets: Secondary | ICD-10-CM | POA: Insufficient documentation

## 2016-04-03 DIAGNOSIS — Z794 Long term (current) use of insulin: Secondary | ICD-10-CM | POA: Insufficient documentation

## 2016-04-03 DIAGNOSIS — S4991XA Unspecified injury of right shoulder and upper arm, initial encounter: Secondary | ICD-10-CM | POA: Diagnosis present

## 2016-04-03 DIAGNOSIS — W01198A Fall on same level from slipping, tripping and stumbling with subsequent striking against other object, initial encounter: Secondary | ICD-10-CM | POA: Diagnosis not present

## 2016-04-03 DIAGNOSIS — I509 Heart failure, unspecified: Secondary | ICD-10-CM | POA: Diagnosis not present

## 2016-04-03 DIAGNOSIS — E109 Type 1 diabetes mellitus without complications: Secondary | ICD-10-CM | POA: Diagnosis not present

## 2016-04-03 DIAGNOSIS — E663 Overweight: Secondary | ICD-10-CM | POA: Insufficient documentation

## 2016-04-03 DIAGNOSIS — Y9389 Activity, other specified: Secondary | ICD-10-CM | POA: Insufficient documentation

## 2016-04-03 DIAGNOSIS — E785 Hyperlipidemia, unspecified: Secondary | ICD-10-CM | POA: Insufficient documentation

## 2016-04-03 NOTE — ED Notes (Signed)
Pt states "I was going out to my car and I fell". Pt states she tripped and fell forward. Pt hit head on the ground, states she scraped her nose and broke her glasses.". Pt also c/o L rib pain, R wrist pain and R shoulder pain. No obvious bruising and deformities. Tender to palpation on L rib area. Pain is reproducable in all areas. Denies LOC. Pain 6/10. Denies blood thinners.

## 2016-04-03 NOTE — ED Notes (Signed)
Pt states she does take blood thinners

## 2016-04-04 LAB — CBG MONITORING, ED: GLUCOSE-CAPILLARY: 125 mg/dL — AB (ref 65–99)

## 2016-04-04 MED ORDER — OXYCODONE-ACETAMINOPHEN 5-325 MG PO TABS
1.0000 | ORAL_TABLET | Freq: Once | ORAL | Status: AC
  Administered 2016-04-04: 1 via ORAL
  Filled 2016-04-04: qty 1

## 2016-04-04 MED ORDER — OXYCODONE-ACETAMINOPHEN 5-325 MG PO TABS: 1.0000 | ORAL_TABLET | Freq: Four times a day (QID) | ORAL | Status: DC | PRN

## 2016-04-04 NOTE — Discharge Instructions (Signed)
You have a fracture at your elbow. You'll be placed in a sling. Maintain the sling for 24-48 hours. After that time, you need to do early range of motion exercises at the elbow. He'll be given pain medication. You need follow-up with the orthopedist as soon as possible.  Radial Head Fracture A radial head fracture is a break of the smaller bone (radius) in the forearm. The head of this bone is the part near the elbow. These fractures commonly happen during a fall, when you land on an outstretched arm. These fractures are more common in middle aged adults and are common with a dislocation of the elbow. SYMPTOMS   Swelling of the elbow joint and pain on the outside of the elbow.  Pain and difficulty in bending or straightening the elbow.  Pain and difficulty in turning the palm of the hand up or down with the elbow bent. DIAGNOSIS  Your caregiver may make this diagnosis by a physical exam. X-rays can confirm the type and amount of fracture. Sometimes a fracture that is not displaced cannot be seen on the original X-ray. TREATMENT  Radial head fractures are classified according to the amount of movement (displacement) of parts from the normal position.  Type 1 Fractures  Type 1 fractures are generally small fractures in which bone pieces remain together (nondisplaced fracture).  The fracture may not be seen on initial X-rays. Usually if X-rays are repeated two to three weeks later, the fracture will show up. A splint or sling is used for a few days. Gentle early motion is used to prevent the elbow from becoming stiff. It should not be done vigorously or forced as this could displace the bone pieces. Type 2 Fractures  With type 2 fractures, bone pieces are slightly displaced and larger pieces of bone are broken off.  If only a little displacement of the bone piece is present, splinting for 4 to 5 days usually works well. This is again followed with gentle active range of motion. Small fragments  may be surgically removed.  Large pieces of bone that can be put back into place will sometimes be fixed with pins or screws to hold them until the bone is healed. If this cannot be done, the fragments are removed. For older, less active people, sometimes the entire radial head is removed if the wrist is not injured. The elbow and arm will still work fine. Soft tissue, tendon, and ligament injuries are corrected at the same time. Type 3 Fractures  Type 3 fractures have multiple broken pieces of bone that cannot be fixed. Surgery is usually needed to remove the broken bits of bone and what is left of the radial head. Soft-tissue damage is repaired. Gentle early motion is used to prevent the elbow from becoming stiff. Sometimes an artificial radial head can be used to prevent deformity if the elbow is unstable. Rest, ice, elevation, immobilization, medications, and pain control are used in the early care. HOME CARE INSTRUCTIONS   Keep the injured part elevated while sitting or lying down. Keep the injury above the level of your heart (the center of the chest). This will decrease swelling and pain.  Apply ice to the injury for 15-20 minutes, 03-04 times per day while awake, for 2 days. Put the ice in a plastic bag and place a towel between the bag of ice and your cast or splint.  Move your fingers to avoid stiffness and minimize swelling.  If you have a plaster or fiberglass  cast:  Do not try to scratch the skin under the cast using sharp or pointed objects.  Check the skin around the cast every day. You may put lotion on any red or sore areas.  Keep your cast dry and clean.  If you have a plaster splint:  Wear the splint as directed.  You may loosen the elastic around the splint if your fingers become numb, tingle, or turn cold or blue.  Do not put pressure on any part of your cast or splint. It may break. Rest your cast only on a pillow for the first 24 hours until it is fully  hardened.  Your cast or splint can be protected during bathing with a plastic bag. Do not lower the cast or splint into the water.  Only take over-the-counter or prescription medicines for pain, discomfort, or fever as directed by your caregiver.  Follow all instructions for follow-up with your caregiver. This includes any orthopedic referrals, physical therapy, and rehabilitation. Any delay in obtaining necessary care could result in a delay or failure of the bones to heal or permanent elbow stiffness.  Do not overdo exercises. This could further damage your injury. SEEK IMMEDIATE MEDICAL CARE IF:   Your cast or splint gets damaged or breaks.  You have more severe pain or swelling than you did before getting the cast.  You have severe pain when stretching your fingers.  There is a bad smell, new stains, and/or pus-like (purulent) drainage coming from under the cast.  Your fingers or hand turn pale or blue, become cold, or you lose feeling.   This information is not intended to replace advice given to you by your health care provider. Make sure you discuss any questions you have with your health care provider.   Document Released: 10/02/2006 Document Revised: 01/01/2015 Document Reviewed: 06/23/2015 Elsevier Interactive Patient Education [employer].

## 2016-04-04 NOTE — ED Notes (Signed)
Pt verbalized understanding of discharge instructions and follow-up care. NAD noted.

## 2016-04-04 NOTE — ED Provider Notes (Signed)
CSN: [id number]     Arrival date & time 04/03/16  1928 History  By signing my name below, I, [name], attest that this documentation has been prepared under the direction and in the presence of Merryl Hacker, MD. Electronically Signed: [name], ED Scribe. 04/04/2016. 1:16 AM   Chief Complaint  Patient presents with  . Fall  . Rib Injury  . Arm Pain    The history is provided by the patient. No language interpreter was used.   [name] is a 65 y.o. female on Plavix after a stroke who presents to the Emergency Department complaining of a fall yesterday. Pt was walking in the grass and tripped over a tree root before falling to the ground. She attempted to break the fall with her right arm but still struck her head on the ground without loss of consciousness. Associated symptoms include right elbow pain, right wrist pain, left-sided rib pain, and an abrasion at the left lower leg. She took nothing for pain PTA. She was ambulatory after the fall. Last tetanus is unknown.   Past Medical History  Diagnosis Date  . Thyroid cancer ([place])     per pt S/p Total Thyroidectomy with Radioactive Iodine Therapy  . Hyperlipidemia   . Hypothyroidism   . Hypertension   . GERD (gastroesophageal reflux disease)   . Seasonal allergies   . Type 1 diabetes mellitus ([place])   . Hemorrhoids   . Depression   . Anemia   . Arthritis   . CHF (congestive heart failure) ([place])   . Stroke [facility])    Past Surgical History  Procedure Laterality Date  . Thyroidectomy  2006  . Abdominal hysterectomy  1995    partial  . Cardiac catheterization      with coronary angiogram  . Ankle fracture surgery Right   . Mini meter   09/08/2014    ELECTRONIC INSULIN PUMP  . Colonoscopy N/A 09/09/2014    Procedure: COLONOSCOPY;  Surgeon: Gatha Mayer, MD;  Location: [facility];  Service: Endoscopy;  Laterality: N/A;  . Left and right heart catheterization with coronary angiogram N/A 05/14/2012     Procedure: LEFT AND RIGHT HEART CATHETERIZATION WITH CORONARY ANGIOGRAM;  Surgeon: Laverda Page, MD;  Location: [facility] CATH LAB;  Service: Cardiovascular;  Laterality: N/A;  . Abdominal angiogram N/A 05/14/2012    Procedure: ABDOMINAL ANGIOGRAM;  Surgeon: Laverda Page, MD;  Location: [facility] CATH LAB;  Service: Cardiovascular;  Laterality: N/A;   Family History  Problem Relation Age of Onset  . Heart disease Mother     MI  . Hyperlipidemia Mother   . Hypertension Mother   . Renal Disease Mother     insufficiency  . Early death Father     Head Injury at Work  . Hyperlipidemia Father   . Hypertension Father   . Diabetes Maternal Aunt   . Prostate cancer Paternal Uncle   . Heart disease Maternal Aunt   . Stroke Son    Social History  Substance Use Topics  . Smoking status: Never Smoker   . Smokeless tobacco: Never Used  . Alcohol Use: No   OB History    No data available     Review of Systems  Respiratory: Negative for shortness of breath.   Cardiovascular:       Rib pain  Musculoskeletal: Positive for arthralgias.       Right elbow and wrist pain  Skin: Positive for wound.  Neurological: Negative for  syncope.  All other systems reviewed and are negative.  Allergies  Review of patient's allergies indicates no known allergies.  Home Medications   Prior to Admission medications   Medication Sig Start Date End Date Taking? Authorizing Provider  acetaminophen (TYLENOL) 500 MG tablet Take 1-2 tablets (500-1,000 mg total) by mouth every 8 (eight) hours as needed for mild pain or moderate pain. 09/02/15  Yes Leeanne Rio, MD  atorvastatin (LIPITOR) 40 MG tablet Take 1 tablet (40 mg total) by mouth daily. 09/02/15  Yes Leeanne Rio, MD  clopidogrel (PLAVIX) 75 MG tablet TAKE 1 TABLET(75 MG) BY MOUTH DAILY 12/13/15  Yes Ashly M Gottschalk, DO  insulin lispro (HUMALOG) 100 UNIT/ML injection Inject 10 Units into the skin 3 (three) times daily with meals.    Yes  Historical Provider, MD  levothyroxine (SYNTHROID, LEVOTHROID) 150 MCG tablet Take 1 tablet (150 mcg total) by mouth daily before breakfast. 02/22/16  Yes Ashly M Gottschalk, DO  loratadine (CLARITIN) 10 MG tablet Take 10 mg by mouth daily.   Yes Historical Provider, MD  losartan-hydrochlorothiazide (HYZAAR) 50-12.5 MG tablet TAKE 1 TABLET BY MOUTH DAILY 02/14/16  Yes Janora Norlander, DO  Multiple Vitamin (MULTIVITAMIN WITH MINERALS) TABS tablet Take 1 tablet by mouth daily.   Yes Historical Provider, MD  potassium gluconate 595 (99 K) MG TABS tablet Take 595 mg by mouth daily.   Yes Historical Provider, MD  ranitidine (ZANTAC) 150 MG tablet Take 150 mg by mouth daily.  09/01/15  Yes Historical Provider, MD  Glory Rosebush VERIO test strip U UTD TID 01/28/16   Historical Provider, MD  oxyCODONE-acetaminophen (PERCOCET/ROXICET) 5-325 MG tablet Take 1 tablet by mouth every 6 (six) hours as needed for severe pain. 04/04/16   Merryl Hacker, MD   BP 141/61 mmHg  Pulse 64  Temp(Src) 98.6 F (37 C) (Oral)  Resp 16  SpO2 99% Physical Exam  Constitutional: She is oriented to person, place, and time. She appears well-developed and well-nourished.  overweight  HENT:  Head: Normocephalic and atraumatic.  Cardiovascular: Normal rate, regular rhythm and normal heart sounds.   No murmur heard. Pulmonary/Chest: Effort normal. No respiratory distress. She has no wheezes. She exhibits tenderness.  Left chest wall tenderness palpation without crepitus, no overlying skin changes  Abdominal: Soft. Bowel sounds are normal. There is no tenderness. There is no rebound.  Musculoskeletal:  Normal range of motion of bilateral hips and knees, limited range of motion of the right elbow, patient can arrange approximately 40 between partial flexion and partial extension, tenderness to palpation over the mid right wrist, no snuffbox tenderness, flexion and extension intact at the wrist and the interphalangeal joints, 2+  radial pulse  Neurological: She is alert and oriented to person, place, and time.  Skin: Skin is warm and dry.  Psychiatric: She has a normal mood and affect.  Nursing note and vitals reviewed.   ED Course  Procedures (including critical care time) DIAGNOSTIC STUDIES: Oxygen Saturation is 99% on RA,  normal by my interpretation.    COORDINATION OF CARE: 12:12 AM Discussed treatment plan which includes CT head without contrast and XRs of the right elbow, right hand, right wrist, and ribs with pt at bedside and pt agreed to plan.  1:10 AM I re-evaluated the patient and her ROM at the right elbow is slightly improved after pain medication.    Labs Review Labs Reviewed  CBG MONITORING, ED - Abnormal; Notable for the following:    Glucose-Capillary  125 (*)    All other components within normal limits    Imaging Review Dg Ribs Unilateral W/chest Left  04/03/2016  CLINICAL DATA:  Tripped and fell forward while walking to car. Left rib tenderness to palpation. Initial encounter. EXAM: LEFT RIBS AND CHEST - 3+ VIEW COMPARISON:  Chest radiograph performed 02/20/2016 FINDINGS: No displaced rib fractures are seen. The lungs are well-aerated. Vascular congestion is noted. There is no evidence of pleural effusion or pneumothorax. The cardiomediastinal silhouette is mildly enlarged. No acute osseous abnormalities are seen. IMPRESSION: Vascular congestion and mild cardiomegaly. No displaced rib fracture seen. Electronically Signed   By: Garald Balding M.D.   On: 04/03/2016 20:36   Dg Elbow Complete Right  04/03/2016  CLINICAL DATA:  Tripped and fell forward while walking to her car from her yard, pain from RIGHT hand to RIGHT elbow, limited range of motion EXAM: RIGHT ELBOW - COMPLETE 3+ VIEW COMPARISON:  None FINDINGS: Bones appear demineralized. Impacted radial neck fracture. No additional fracture dislocation. Joint spaces preserved. Large elbow joint effusion. Tiny spur at tip of the coronoid  process ulna. IMPRESSION: Impacted RIGHT radial neck fracture with associated elbow joint effusion. Electronically Signed   By: Lavonia Dana M.D.   On: 04/03/2016 20:32   Dg Wrist Complete Right  04/03/2016  CLINICAL DATA:  Status post fall 4 wall walking to car. Right hand pain. Initial encounter. EXAM: RIGHT WRIST - COMPLETE 3+ VIEW COMPARISON:  None. FINDINGS: There is no evidence of fracture or dislocation. Degenerative change is noted at the first carpometacarpal joint, with subcortical cyst formation. The carpal rows are intact, and demonstrate normal alignment. The joint spaces are preserved. No significant soft tissue abnormalities are seen. IMPRESSION: No evidence of fracture or dislocation. Degenerative change at the first carpometacarpal joint. Electronically Signed   By: Garald Balding M.D.   On: 04/03/2016 20:35   Ct Head Wo Contrast  04/03/2016  CLINICAL DATA:  [place] and hit forehead today. EXAM: CT HEAD WITHOUT CONTRAST TECHNIQUE: Contiguous axial images were obtained from the base of the skull through the vertex without intravenous contrast. COMPARISON:  08/29/2015 FINDINGS: Stable age advanced cerebral atrophy, ventriculomegaly and periventricular white matter disease. Stable bilateral basal ganglia calcifications. No acute intracranial findings. No extra-axial fluid collections are identified. No skull fracture. The paranasal sinuses and mastoid air cells are clear except for scattered ethmoid sinus disease. The globes are intact. Stable vascular calcifications, advanced for age. IMPRESSION: No acute intracranial findings or skull fracture. Electronically Signed   By: Marijo Sanes M.D.   On: 04/03/2016 20:59   Dg Hand Complete Right  04/03/2016  CLINICAL DATA:  Status post fall forward. Acute onset of right hand pain. Initial encounter. EXAM: RIGHT HAND - COMPLETE 3+ VIEW COMPARISON:  None. FINDINGS: There is no evidence of fracture or dislocation. Mild degenerative change is noted at the  first carpometacarpal joint. The carpal rows are intact, and demonstrate normal alignment. The soft tissues are unremarkable in appearance. IMPRESSION: No evidence of fracture or dislocation. Mild degenerative change at the first carpometacarpal joint. Electronically Signed   By: Garald Balding M.D.   On: 04/03/2016 20:34   I have personally reviewed and evaluated these images as part of my medical decision-making.   EKG Interpretation None      MDM   Final diagnoses:  Radial head fracture, right, closed, initial encounter    Patient presents following a fall. Nontoxic on exam. [place] on an outstretched hand and also hit her  head. She's otherwise nontoxic. She takes Plavix. X-rays are notable for a right radial head fracture with impaction. On initial evaluation she did have limited range of motion. She was given pain medication. On repeat examination range of motion improved but still less than 90 range of motion. Discussed with patient that she would be placed in a sling. She needs to start early range of motion exercises at home. Follow-up with orthopedist as soon as possible. She will be given pain management.  After history, exam, and medical workup I feel the patient has been appropriately medically screened and is safe for discharge home. Pertinent diagnoses were discussed with the patient. Patient was given return precautions.  I personally performed the services described in this documentation, which was scribed in my presence. The recorded information has been reviewed and is accurate.    Merryl Hacker, MD 04/04/16 [phone]

## 2016-04-04 NOTE — ED Notes (Signed)
MD at bedside. 

## 2016-04-18 ENCOUNTER — Ambulatory Visit: Payer: Medicare Other | Admitting: *Deleted

## 2016-05-15 ENCOUNTER — Ambulatory Visit (INDEPENDENT_AMBULATORY_CARE_PROVIDER_SITE_OTHER): Payer: Medicare Other | Admitting: Family Medicine

## 2016-05-15 ENCOUNTER — Encounter: Payer: Self-pay | Admitting: Family Medicine

## 2016-05-15 VITALS — BP 146/47 | HR 76 | Temp 97.8°F | Ht 66.0 in | Wt 273.3 lb

## 2016-05-15 DIAGNOSIS — R300 Dysuria: Secondary | ICD-10-CM

## 2016-05-15 DIAGNOSIS — N3001 Acute cystitis with hematuria: Secondary | ICD-10-CM | POA: Diagnosis not present

## 2016-05-15 LAB — POCT URINALYSIS DIPSTICK
Bilirubin, UA: NEGATIVE
GLUCOSE UA: 500
KETONES UA: NEGATIVE
Nitrite, UA: NEGATIVE
Protein, UA: NEGATIVE
SPEC GRAV UA: 1.01
Urobilinogen, UA: 0.2
pH, UA: 6.5

## 2016-05-15 LAB — POCT UA - MICROSCOPIC ONLY

## 2016-05-15 MED ORDER — CEPHALEXIN 500 MG PO CAPS: 500.0000 mg | ORAL_CAPSULE | Freq: Four times a day (QID) | ORAL | Status: DC

## 2016-05-15 NOTE — Patient Instructions (Signed)
Thank you for coming in to clinic today.  1. You have a Urinary Tract Infection - this is very common, your symptoms are reassuring and you should get better within 1 week on the antibiotics - Start Keflex 500mg  4 times daily for next 7 days, complete entire course, even if feeling better - We sent urine for a culture, we will call you within next few days if we need to change antibiotics - Please drink plenty of fluids, improve hydration over next 1 week  If symptoms worsening, developing nausea / vomiting, worsening back pain, fevers / chills / sweats, then please return for re-evaluation sooner.  To prevent bladder and kidney infections...  1. Wipe front to back after using the restroom  2. Drink enough water to keep your pee clear to pale yellow  If you think you are getting another bladder infection, start drinking cranberry juice and come see [place] so we can check the urine.  Follow as planned with Endocrinologist for Diabetes follow-up A1c  Follow-up as scheduled with PCP Dr Lajuana Ripple in 05/2016  If you have any other questions or concerns, please feel free to call the clinic to contact me. You may also schedule an earlier appointment if necessary.  However, if your symptoms get significantly worse, please go to the Emergency Department to seek immediate medical attention.  Nobie Putnam, [place]

## 2016-05-15 NOTE — Progress Notes (Signed)
Subjective:    Patient ID: [name], female    DOB: Mar 10, 1951, 65 y.o.   MRN: [id number]  [name] is a 65 y.o. female presenting on 05/15/2016 for Dysuria   Patient presents for a same day appointment.   HPI  UTI - Reports symptoms started within past 24 hours, yesterday described some burning and "stinging" sometimes with urination, increased urinary frequency and maybe some incomplete emptying. Similar symptoms to previous UTI. Last recorded UTI 06/2014. - Established with Urologist back in 2013, for recurrent UTI, said that "everything was fine", seemed to be related to Diabetes - PMH Type 2 Diabetes, she is due for A1c but scheduled to see her endocrinologist in 3 days - Denies any fevers/chills, hematuria, urinary odor, abdominal pain, nausea, vomiting, different or worsening back or flank pain  Social History  Substance Use Topics  . Smoking status: Never Smoker   . Smokeless tobacco: Never Used  . Alcohol Use: No    Review of Systems Per HPI unless specifically indicated above     Objective:    BP 146/47 mmHg  Pulse 76  Temp(Src) 97.8 F (36.6 C) (Oral)  Ht 5\' 6"  (1.676 m)  Wt 273 lb 4.8 oz (123.968 kg)  BMI 44.13 kg/m2  Wt Readings from Last 3 Encounters:  05/15/16 273 lb 4.8 oz (123.968 kg)  02/20/16 270 lb (122.471 kg)  02/01/16 273 lb 9.6 oz (124.104 kg)    Physical Exam  Constitutional: She appears well-developed and well-nourished. No distress.  Obese, Well-appearing, comfortable, cooperative, has cane for ambulation  HENT:  Mouth/Throat: Oropharynx is clear and moist.  Cardiovascular: Normal rate.   Abdominal: Soft. Bowel sounds are normal. She exhibits no distension and no mass. There is no tenderness. There is no rebound and no guarding.  Has insulin pump.  Musculoskeletal:  Back non-tender, no CVAT  Neurological: She is alert.  Skin: Skin is warm and dry. She is not diaphoretic.  Nursing note and vitals reviewed.  Results  for orders placed or performed in visit on 05/15/16  Urinalysis Dipstick  Result Value Ref Range   Color, UA YELLOW    Clarity, UA CLOUDY    Glucose, UA 500    Bilirubin, UA NEG    Ketones, UA NEG    Spec Grav, UA 1.010    Blood, UA MODERATE    pH, UA 6.5    Protein, UA NEG    Urobilinogen, UA 0.2    Nitrite, UA NEG    Leukocytes, UA large (3+) (A) Negative  POCT UA - Microscopic Only  Result Value Ref Range   WBC, Ur, HPF, POC TOO MANY TO COUNT    RBC, urine, microscopic 1-5    Bacteria, U Microscopic 1+    Epithelial cells, urine per micros OCCASIONAL       Assessment & Plan:   Problem List Items Addressed This Visit    None    Visit Diagnoses    Acute cystitis with hematuria    -  Primary    Relevant Medications    cephALEXin (KEFLEX) 500 MG capsule    Other Relevant Orders    Urine culture    POCT UA - Microscopic Only (Completed)    Dysuria        Relevant Orders    Urinalysis Dipstick (Completed)    POCT UA - Microscopic Only (Completed)       Clinically consistent with UTI and confirmed on UA (large leuks, TNTC WBC). No  recent UTIs or abx courses but prior recurrent. Known DM2 (poor control). No concern for pyelo today (no systemic symptoms, neg fever, back pain, n/v).  Plan: 1. Start Keflex QID x 7 days 2. Ordered Urine culture - will follow 3. Continue impoved PO hydration 4. Return criteria given   Meds ordered this encounter  Medications  . cephALEXin (KEFLEX) 500 MG capsule    Sig: Take 1 capsule (500 mg total) by mouth 4 (four) times daily. For 7 days    Dispense:  28 capsule    Refill:  0      Follow up plan: Return in about 2 weeks (around 05/29/2016) for Follow-up as scheduled with PCP.  Nobie Putnam, [place], PGY-3

## 2016-05-17 LAB — URINE CULTURE: Colony Count: >=100000

## 2016-05-18 ENCOUNTER — Other Ambulatory Visit: Payer: Self-pay | Admitting: Family Medicine

## 2016-05-18 ENCOUNTER — Telehealth: Payer: Self-pay | Admitting: Family Medicine

## 2016-05-18 DIAGNOSIS — B3731 Acute candidiasis of vulva and vagina: Secondary | ICD-10-CM

## 2016-05-18 DIAGNOSIS — B373 Candidiasis of vulva and vagina: Secondary | ICD-10-CM

## 2016-05-18 MED ORDER — FLUCONAZOLE 150 MG PO TABS: ORAL_TABLET | ORAL | Status: DC

## 2016-05-18 NOTE — Telephone Encounter (Signed)
Called patient back, last saw on 5/22 for UTI. Urine Culture grew E.Coli >100k CFU, consistent with E Coli UTI, similar to 06/2014. Patient was already started on empiric Keflex. Urine sensitivities says not reportable for cefazolin.  Today she reports doing better and burning is resolved, continues to take Keflex with improvement. Given improvement will continue on this to finish course. Sent in diflucan as requested for possible yeast infection after antibiotics. Empirically  Nobie Putnam, [place], PGY-3

## 2016-06-05 ENCOUNTER — Encounter: Payer: Self-pay | Admitting: Family Medicine

## 2016-06-05 ENCOUNTER — Ambulatory Visit (INDEPENDENT_AMBULATORY_CARE_PROVIDER_SITE_OTHER): Payer: Medicare Other | Admitting: Family Medicine

## 2016-06-05 VITALS — BP 148/57 | HR 67 | Temp 97.7°F | Ht 67.0 in | Wt 270.0 lb

## 2016-06-05 DIAGNOSIS — T1490 Injury, unspecified: Secondary | ICD-10-CM

## 2016-06-05 DIAGNOSIS — M25531 Pain in right wrist: Secondary | ICD-10-CM | POA: Diagnosis not present

## 2016-06-05 DIAGNOSIS — S52121S Displaced fracture of head of right radius, sequela: Secondary | ICD-10-CM | POA: Diagnosis not present

## 2016-06-05 DIAGNOSIS — M545 Low back pain, unspecified: Secondary | ICD-10-CM

## 2016-06-05 DIAGNOSIS — W19XXXA Unspecified fall, initial encounter: Secondary | ICD-10-CM

## 2016-06-05 DIAGNOSIS — Z780 Asymptomatic menopausal state: Secondary | ICD-10-CM

## 2016-06-05 NOTE — Patient Instructions (Addendum)
I have sent a referral to your physician to address your wrist and back pain.  I will defer xrays to your orthopedist.  You can continue using tylenol arthritis as needed for joint pain.  Back Pain, Adult Back pain is very common in adults.The cause of back pain is rarely dangerous and the pain often gets better over time.The cause of your back pain may not be known. Some common causes of back pain include:  Strain of the muscles or ligaments supporting the spine.  Wear and tear (degeneration) of the spinal disks.  Arthritis.  Direct injury to the back. For many people, back pain may return. Since back pain is rarely dangerous, most people can learn to manage this condition on their own. HOME CARE INSTRUCTIONS Watch your back pain for any changes. The following actions may help to lessen any discomfort you are feeling:  Remain active. It is stressful on your back to sit or stand in one place for long periods of time. Do not sit, drive, or stand in one place for more than 30 minutes at a time. Take short walks on even surfaces as soon as you are able.Try to increase the length of time you walk each day.  Exercise regularly as directed by your health care provider. Exercise helps your back heal faster. It also helps avoid future injury by keeping your muscles strong and flexible.  Do not stay in bed.Resting more than 1-2 days can delay your recovery.  Pay attention to your body when you bend and lift. The most comfortable positions are those that put less stress on your recovering back. Always use proper lifting techniques, including:  Bending your knees.  Keeping the load close to your body.  Avoiding twisting.  Find a comfortable position to sleep. Use a firm mattress and lie on your side with your knees slightly bent. If you lie on your back, put a pillow under your knees.  Avoid feeling anxious or stressed.Stress increases muscle tension and can worsen back pain.It is  important to recognize when you are anxious or stressed and learn ways to manage it, such as with exercise.  Take medicines only as directed by your health care provider. Over-the-counter medicines to reduce pain and inflammation are often the most helpful.Your health care provider may prescribe muscle relaxant drugs.These medicines help dull your pain so you can more quickly return to your normal activities and healthy exercise.  Apply ice to the injured area:  Put ice in a plastic bag.  Place a towel between your skin and the bag.  Leave the ice on for 20 minutes, 2-3 times a day for the first 2-3 days. After that, ice and heat may be alternated to reduce pain and spasms.  Maintain a healthy weight. Excess weight puts extra stress on your back and makes it difficult to maintain good posture. SEEK MEDICAL CARE IF:  You have pain that is not relieved with rest or medicine.  You have increasing pain going down into the legs or buttocks.  You have pain that does not improve in one week.  You have night pain.  You lose weight.  You have a fever or chills. SEEK IMMEDIATE MEDICAL CARE IF:   You develop new bowel or bladder control problems.  You have unusual weakness or numbness in your arms or legs.  You develop nausea or vomiting.  You develop abdominal pain.  You feel faint.   This information is not intended to replace advice given to  you by your health care provider. Make sure you discuss any questions you have with your health care provider.   Document Released: 12/11/2005 Document Revised: 01/01/2015 Document Reviewed: 04/14/2014 Elsevier Interactive Patient Education [employer].

## 2016-06-05 NOTE — Progress Notes (Signed)
    Subjective: CC: mechanical fall, back pain HPI: [name] is a 65 y.o. female presenting to clinic today for office visit. Concerns today include:  1. Mechanical Fall  She notes that she fell forward after tripping in the grass in April 2017 and was found to have a right radial head fracture.  She had a CT scan of her head that was negative. She had xrays of her right UE.  She is seeing Dr Grandville Silos on [place] [facility].  She notes that she is having low back pain since her fall.  She notes that she has a history of old fracture there.  She notes that the back pain is not daily. She reports that Percocet was too sedating so she discontinued the medication.  She notes that Tylenol Arthritis is working well but she has to take medication every 8 hours to work.  Denies weakness, numbness/tingling.    Social History Reviewed: non smoker. FamHx and MedHx reviewed.  Please see EMR. Health Maintenance: Dexa scan  ROS: Per HPI  Objective: Office vital signs reviewed. BP 148/57 mmHg  Pulse 67  Temp(Src) 97.7 F (36.5 C) (Oral)  Ht 5\' 7"  (1.702 m)  Wt 270 lb (122.471 kg)  BMI 42.28 kg/m2  Physical Examination:  General: Awake, alert, obese, well appearing female, No acute distress Spine: flattening of lumbar spine, no midline TTP, no paraspinal TTP, has full painless AROM Extremities: warm, well perfused, No edema, cyanosis or clubbing; 5/5 LE and UE strength.  No swelling or ecchymosis appreciated over right radial head. Neuro: Strength and sensation grossly intact, patellar DTRs 1/4, uses cane for ambulation  Assessment/ Plan: 65 y.o. female   1. Fall with injury.  Seen in ED in 03-2016.  Is being seen by [facility] orth[facility]. - Discussed fall prevention - Recommended scheduling Medicare Wellness exam  2. Right radial head fracture, sequela.  Seems reluctant to pursue surgical intervention, though appears that this is her ortho's current recommendation - ROI  signed today - Continue seeing ortho - DG Bone Density; Future  3. Bilateral low back pain without sciatica, unspecified chronicity. No focal findings on exam. - Tylenol PRN - Ambulatory referral to Orthopedic Surgery - May benefit from PT  4. Right wrist pain. No focal findings on exam. - Tylenol PRN - Ambulatory referral to Orthopedic Surgery  5. Post-menopausal, fracture provoked but patient is postmenopausal.  Will evaluate with DEXA. - DG Bone Density; Future - Patient also to schedule mammogram  Follow up in 3 months for BP.  Total time spent with patient 26 minutes.  Greater than 50% of encounter spent in coordination of care/counseling.   Janora Norlander, DO PGY-2, [place]

## 2016-06-23 ENCOUNTER — Ambulatory Visit (INDEPENDENT_AMBULATORY_CARE_PROVIDER_SITE_OTHER): Payer: Medicare Other | Admitting: Family Medicine

## 2016-06-23 ENCOUNTER — Encounter: Payer: Self-pay | Admitting: Family Medicine

## 2016-06-23 VITALS — BP 109/53 | HR 131 | Temp 98.7°F | Wt 267.0 lb

## 2016-06-23 DIAGNOSIS — R197 Diarrhea, unspecified: Secondary | ICD-10-CM | POA: Diagnosis not present

## 2016-06-23 DIAGNOSIS — K921 Melena: Secondary | ICD-10-CM

## 2016-06-23 LAB — HEMOCCULT GUIAC POC 1CARD (OFFICE): FECAL OCCULT BLD: NEGATIVE

## 2016-06-23 LAB — POCT HEMOGLOBIN: Hemoglobin: 11.2 g/dL — AB (ref 12.2–16.2)

## 2016-06-23 NOTE — Progress Notes (Signed)
   Subjective:    Patient ID: [name], female    DOB: [name], 1951, 65 y.o.   MRN: [id number]  HPI  Patient presents for Same Day Appointment  CC: diarrhea  # Diarrhea:  Started yesterday morning at 830am. Had 6-7 episodes yesterday. 3-4 episodes so far today  Tried drinking some gatorade  Had very loose stools this morning, had some incontinence.   Had very dark black stool about 20 minutes ago. Prior to that it was brown/yellowish. Did not notice any blood in stool.   Tried taking imodium (has taken 4 in past 24 hours) -- has helped  No stomach pains, no chest pain.  Had one episode of vomiting this afternoon and it was black and mucosy, looked like coffee grounds.   She does take zantac for GERD  Does not drink alcohol  No known undercooked foods ROS: no dizziness or lightheadedness  Social Hx: never smoker  Review of Systems   See HPI for ROS.   Past medical history, surgical, family, and social history reviewed and updated in the EMR as appropriate.  Objective:  BP 109/53 mmHg  Pulse 131  Temp(Src) 98.7 F (37.1 C) (Oral)  Wt 267 lb (121.11 kg)  SpO2 98% Vitals and nursing note reviewed  General: no apparent distress  CV: tachycardic, regular rhythm, no murmur appreciated Resp: clear to auscultation bilaterally, normal effort Abdomen: obese, tenderness to palpation epigastric and RUQ but no rebound or guarding, bowel sounds normal GU: rectal exam: no frank blood, small hemorrhoids present.  Assessment & Plan:  1. Melena / Diarrhea Story concerning for upper GI bleed with coffee ground emesis x 1 and melena onset today, however FOBT is negative and Hgb stable. No other red flags, not dizzy. Recommended drinking plenty of fluids, observation and return in around 4 days if not improving. Return precautions given. - POCT hemoglobin - POCT occult blood stool  Return in about 4 days (around 06/27/2016), or if symptoms worsen or fail to improve.

## 2016-07-03 ENCOUNTER — Telehealth: Payer: Self-pay | Admitting: Family Medicine

## 2016-07-03 ENCOUNTER — Encounter: Payer: Self-pay | Admitting: Family Medicine

## 2016-07-03 NOTE — Telephone Encounter (Signed)
Example of what letter should say (form letter) has been put in PCP's box. [name], CMA

## 2016-07-03 NOTE — Telephone Encounter (Signed)
Patients asks PCP for a letterhead note for the purpose to adopt a child. Please, follow up.

## 2016-07-03 NOTE — Telephone Encounter (Signed)
Spoke to pt. She dropped off a form this am. [name], [name]

## 2016-07-03 NOTE — Telephone Encounter (Signed)
Franne Grip spoken to Ms Roehrig about this  And I will take care of this on Wednesday, when I return to office.

## 2016-07-06 NOTE — Telephone Encounter (Signed)
Left voice message that letter is complete and ready for pick up.  Derl Barrow, RN

## 2016-07-31 ENCOUNTER — Ambulatory Visit (INDEPENDENT_AMBULATORY_CARE_PROVIDER_SITE_OTHER): Payer: Medicare Other | Admitting: Neurology

## 2016-07-31 VITALS — BP 148/70 | HR 70 | Resp 20 | Ht 67.0 in | Wt 268.0 lb

## 2016-07-31 DIAGNOSIS — E1159 Type 2 diabetes mellitus with other circulatory complications: Secondary | ICD-10-CM

## 2016-07-31 DIAGNOSIS — R0683 Snoring: Secondary | ICD-10-CM | POA: Insufficient documentation

## 2016-07-31 DIAGNOSIS — I6302 Cerebral infarction due to thrombosis of basilar artery: Secondary | ICD-10-CM | POA: Diagnosis not present

## 2016-07-31 DIAGNOSIS — E785 Hyperlipidemia, unspecified: Secondary | ICD-10-CM | POA: Diagnosis not present

## 2016-07-31 DIAGNOSIS — I10 Essential (primary) hypertension: Secondary | ICD-10-CM | POA: Diagnosis not present

## 2016-07-31 NOTE — Patient Instructions (Addendum)
-   continue plavix and lipitor for stroke prevention - Follow up with your primary care physician for stroke risk factor modification. Recommend maintain blood pressure goal 120-140/80, diabetes with hemoglobin A1c goal below 6.5% and lipids with LDL cholesterol goal below 70 mg/dL.  - check BP and glucose at home and record - healthy diet and regular exercises - will refer to consider sleep study testing.  - follow up in one year.

## 2016-07-31 NOTE — Progress Notes (Signed)
STROKE NEUROLOGY FOLLOW UP NOTE  NAME: LASHARA MILLS DOB: 1951/02/07  REASON FOR VISIT: stroke follow up HISTORY FROM: pt and chart  Today we had the pleasure of seeing Rosetta VONNETTA SCHMIEG in follow-up at our [facility]rology [facility]. Pt was accompanied by no one.   History Summary Ms. Rosetta Jerilynn Mages Stepanski is a 65 y.o. female with history of HTN, DM type 2, hyperlipidemia, chronic congestive heart failure, thyroid cancer s/p total thyroidectomy, depression, and GERD was admitted on 08/29/15 for left arm numbness, left leg weakness, trouble walking. 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 CTA head and neck showed b/l cavernous ICA stenosis, and bilateral VA stenosis. TTE showed EF 55-60%. LDL 61 and A1C 9.1. Due to intracranial stenosis, she was put on dual antiplatelet and continued on statin. Her imdur and metoprolol were discontinued during admission. She was discharged with close outpt PCP follow up.   10/29/15 follow up - the patient has been doing well. No recurrent stroke like symptoms. She has been followed with PCP, continued on dual antiplatelet. She has seen Dr. Einar Gip in the past for CHF, CAD but has not followed with him in 3 years. Her TTE was EF 55-60%. Will hold off lmdur and metoprolol but recommend to follow up with cardiology. She still in insulin pump and stated that her sugar is in good control lately. Bp today 120/75.  02/01/16 folow up - pt has been doing well. No complains. On plavix and lipitor now. Not checking BP at home and today BP 154/74. Stated that glucose in better control. Doing exercise at home. Followed with Dr. Marlou Porch in cardiology and OK with off imdur and metoprolol.   Interval History During the interval time, pt has been doing well from stroke standpoint. She had right radial head fracture after a fall in 03/2016, has been following with ortho and doing well so far. Her BP today 148/70 and stated at home her BP around 140/70. Glucose also  better, less than 200 all the time now. This morning at home 111. Still on insulin pump. She is following with PCP and endocrinology. She admits snoring during sleep but not much daytime sleepiness. Had no sleep study in the past.   REVIEW OF SYSTEMS: Full 14 system review of systems performed and notable only for those listed below and in HPI above, all others are negative:  Constitutional:   Cardiovascular:  Ear/Nose/Throat:   Skin:  Eyes:   Respiratory:   Gastroitestinal:   Genitourinary:  Hematology/Lymphatic:   Endocrine:  Musculoskeletal:   Allergy/Immunology:   Neurological:   Psychiatric:  Sleep:   The following represents the patient's updated allergies and side effects list: No Known Allergies  The neurologically relevant items on the patient's problem list were reviewed on today's visit.  Neurologic Examination  A problem focused neurological exam (12 or more points of the single system neurologic examination, vital signs counts as 1 point, cranial nerves count for 8 points) was performed.  Blood pressure (!) 148/70, pulse 70, resp. rate 20, height 5\' 7"  (1.702 m), weight 268 lb (121.6 kg).  General - morbid obesity, well developed, in no apparent distress.  Ophthalmologic - Fundi not visualized due to small pupils.  Cardiovascular - Regular rate and rhythm.  Mental Status -  Level of arousal and orientation to time, place, and person were intact. Language including expression, naming, repetition, comprehension was assessed and found intact. Fund of Knowledge was assessed and was intact.  Cranial Nerves  II - XII - II - Visual field intact OU. III, IV, VI - Extraocular movements intact. V - Facial sensation intact bilaterally. VII - Facial movement intact bilaterally. VIII - Hearing & vestibular intact bilaterally. X - Palate elevates symmetrically. XI - Chin turning & shoulder shrug intact bilaterally. XII - Tongue protrusion intact.  Motor Strength - The  patient's strength was normal in all extremities and pronator drift was absent.  Bulk was normal and fasciculations were absent.   Motor Tone - Muscle tone was assessed at the neck and appendages and was normal.  Reflexes - The patient's reflexes were 1+ in all extremities and she had no pathological reflexes.  Sensory - Light touch, temperature/pinprick were assessed and were normal.    Coordination - The patient had normal movements in the hands and feet with no ataxia or dysmetria.  Tremor was absent.  Gait and Station - The patient's transfers, posture, gait, station, and turns were observed as normal.  Assessment mRS = 0  Data reviewed: I personally reviewed the images and agree with the radiology interpretations.  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 Anterior and middle cerebral arteries widely patent bilaterally Moderate to severe stenosis distal vertebral artery bilaterally due to calcific stenosis.   Mr Brain Wo Contrast 08/29/2015 Small acute infarct in the dorsal brainstem region of medial lemniscus. Chronic infarct left pons Electronically Signed By: Franchot Gallo M.D. On: 08/29/2015 10:26   CTA neck - 1. Minimal atherosclerosis in the neck. No carotid or vertebral artery stenosis in the neck. 2. Stable visualized brain parenchyma, subcentimeter right dorsal brainstem infarct remains occult on CT.  2D echo - - Left ventricle: The cavity size was normal. Systolic function was normal. The estimated ejection fraction was in the range of 55% to 60%. Wall motion was normal; there were no regional wall motion abnormalities. - Mitral valve: Calcified annulus. There was mild regurgitation. - Left atrium: The atrium was moderately dilated. - Pulmonary arteries: PA peak pressure: 50 mm Hg (S).  Component     Latest Ref Rng 08/29/2015 08/30/2015  Cholesterol     0 - 200  mg/dL  131  Triglycerides     <150 mg/dL  70  HDL Cholesterol     >40 mg/dL  56  Total CHOL/HDL Ratio       2.3  VLDL     0 - 40 mg/dL  14  LDL (calc)     0 - 99 mg/dL  61  Hemoglobin A1C     4.8 - 5.6 % 9.1 (H)   Mean Plasma Glucose      214     Assessment: As you may recall, she is a 65 y.o. 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 CTA head and neck showed b/l cavernous ICA stenosis, and bilateral VA stenosis. TTE showed EF 55-60%. LDL 61 and A1C 9.1. Due to intracranial stenosis, she was put on dual antiplatelet and continued on statin. Her imdur and metoprolol were discontinued during admission. During the interval time, the patient has been doing well. Followed with cardiology and off lmdur and metoprolol. Finished off DAPT, on plavix now. BP and glucose better controlled and following with PCP and endocrinology. Still on insulin pump. Admits snoring at night  and will do sleep study.   Plan:  - continue plavix and lipitor for stroke prevention - Follow up with your primary care physician for stroke risk factor modification. Recommend maintain blood pressure goal 120-140/80, diabetes with hemoglobin A1c goal below 6.5% and lipids with LDL cholesterol goal below 70 mg/dL.  - check BP and glucose at home and record - healthy diet and regular exercises - will refer to consider sleep study testing.  - follow up in one year.  I spent more than 25 minutes of face to face time with the patient. Greater than 50% of time was spent in counseling and coordination of care. We have discussed about BP check at home, better glucose control, following with PCP and endocrinologist, sleep study, and weight loss.  Orders Placed This Encounter  Procedures  . Ambulatory referral to Sleep Studies    Referral Priority:    Routine    Referral Type:   Consultation    Referral Reason:   Specialty Services Required    Number of Visits Requested:   1    Meds ordered this encounter  Medications  . fexofenadine (ALLEGRA) 30 MG tablet    Sig: Take 30 mg by mouth 2 (two) times daily.    Patient Instructions  - continue plavix and lipitor for stroke prevention - Follow up with your primary care physician for stroke risk factor modification. Recommend maintain blood pressure goal 120-140/80, diabetes with hemoglobin A1c goal below 6.5% and lipids with LDL cholesterol goal below 70 mg/dL.  - check BP and glucose at home and record - healthy diet and regular exercises - will refer to consider sleep study testing.  - follow up in one year.   Rosalin Hawking, MD PhD [facility] [address] [place], [place] [id number] [phone]

## 2016-08-05 ENCOUNTER — Encounter: Payer: Self-pay | Admitting: Family Medicine

## 2016-08-08 ENCOUNTER — Other Ambulatory Visit: Payer: Self-pay | Admitting: Family Medicine

## 2016-08-08 ENCOUNTER — Encounter: Payer: Self-pay | Admitting: Family Medicine

## 2016-08-08 ENCOUNTER — Ambulatory Visit (INDEPENDENT_AMBULATORY_CARE_PROVIDER_SITE_OTHER): Payer: Medicare Other | Admitting: Family Medicine

## 2016-08-08 VITALS — BP 153/66 | HR 64 | Temp 98.1°F | Ht 67.0 in | Wt 270.0 lb

## 2016-08-08 DIAGNOSIS — Z79899 Other long term (current) drug therapy: Secondary | ICD-10-CM

## 2016-08-08 DIAGNOSIS — E890 Postprocedural hypothyroidism: Secondary | ICD-10-CM

## 2016-08-08 DIAGNOSIS — D649 Anemia, unspecified: Secondary | ICD-10-CM

## 2016-08-08 DIAGNOSIS — K219 Gastro-esophageal reflux disease without esophagitis: Secondary | ICD-10-CM

## 2016-08-08 DIAGNOSIS — E109 Type 1 diabetes mellitus without complications: Secondary | ICD-10-CM

## 2016-08-08 DIAGNOSIS — Z1231 Encounter for screening mammogram for malignant neoplasm of breast: Secondary | ICD-10-CM

## 2016-08-08 LAB — ANEMIA PANEL
%SAT: 27 % (ref 11–50)
ABS Retic: 40500 cells/uL (ref 20000–80000)
FOLATE: 20.1 ng/mL (ref >5.4)
Ferritin: 110 ng/mL (ref 20–288)
Iron: 70 ug/dL (ref 45–160)
RBC.: 4.05 MIL/uL (ref 3.80–5.10)
Retic Ct Pct: 1 %
TIBC: 260 ug/dL (ref 250–450)
UIBC: 190 ug/dL (ref 125–400)
VITAMIN B 12: 651 pg/mL (ref 200–1100)

## 2016-08-08 LAB — POCT HEMOGLOBIN: HEMOGLOBIN: 11.9 g/dL — AB (ref 12.2–16.2)

## 2016-08-08 LAB — TSH: TSH: 8.49 mIU/L — ABNORMAL HIGH

## 2016-08-08 LAB — POCT GLYCOSYLATED HEMOGLOBIN (HGB A1C): HEMOGLOBIN A1C: 9

## 2016-08-08 NOTE — Assessment & Plan Note (Signed)
Sees endocrinology next week.  Last A1c per patient was ~8.5.  She reports weight gain.  No recent lows.  Avg FBG 140s.  Uses insulin pump.  A1c today 9.0.  Anemia may be playing a part in falsely elevating A1c.  Anemia panel ordered.

## 2016-08-08 NOTE — Progress Notes (Signed)
    Subjective: CC: acid reflux HPI: [name] is a 65 y.o. female presenting to clinic today for follow up. Concerns today include:  1. Acid reflux Reports that she occasionally gets breakthrough reflux.  She reports that she was recently ill with diarrhea/ vomiting.  She is taking 150mg  Zantac twice daily.  She has a h/o CVA and is on Plavix, therefore, not a candidate for PPI.  Has not tried taking tums.  No abdominal pain, nausea, vomiting, blood in stool.  2. Anemia Patient reports that she was seen by another provider who told her that her Hgb was low.  She notes that she needed Fe in the past.  She reports cold intolerance.  She notes that she has been taking her antacid about 10 minutes after her thyroid medication.  She takes a daily MVI.  No hematochezia, no palpitations, no hematuria.   Social History Reviewed: non smoker. FamHx and MedHx reviewed.  Please see EMR. Health Maintenance: declines DEXA for now.  Has mammo scheduled for end of August  ROS: Per HPI  Objective: Office vital signs reviewed. BP (!) 153/66   Pulse 64   Temp 98.1 F (36.7 C) (Oral)   Ht 5\' 7"  (1.702 m)   Wt 270 lb (122.5 kg)   BMI 42.29 kg/m   Physical Examination:  General: Awake, alert, obese, No acute distress HEENT: Normal    Neck: No masses palpated. No lymphadenopathy Cardio: regular rate and rhythm, S1S2 heard, no murmurs appreciated Pulm: clear to auscultation bilaterally, no wheezes, rhonchi or rales, normal WOB on room air MSK: Normal gait and station, uses cane for ambulation Skin: dry, intact, no rashes or lesions  Results for orders placed or performed in visit on 08/08/16 (from the past 24 hour(s))  HgB A1c     Status: Abnormal   Collection Time: 08/08/16 10:00 AM  Result Value Ref Range   Hemoglobin A1C 9.0    Assessment/ Plan: 65 y.o. female   Type 1 diabetes mellitus Sees endocrinology next week.  Last A1c per patient was ~8.5.  She reports weight gain.  No  recent lows.  Avg FBG 140s.  Uses insulin pump.  A1c today 9.0.  Anemia may be playing a part in falsely elevating A1c.  Anemia panel ordered.  Hypothyroidism Having cold intolerance.  No other symptoms.  Had thyroid surgically removed in past.  Compliant with meds.  TSH obtained today.  GERD (gastroesophageal reflux disease) Patient not a candidate for PPI, she is on Plavix.   - Continue Zantac 150mg  BID - Take Tums prn breakthrough acid  Anemia, unspecified anemia type.  Last Hgb 11.2.  Last CBC with isolated low hgb. - Continue MVI - Anemia panel - POCT hemoglobin  Will contact with results.  Advised patient to discuss anemia w/u with endo.  Follow up in 2 weeks for repeat BP.  Janora Norlander, DO PGY-3, [facility]

## 2016-08-08 NOTE — Assessment & Plan Note (Signed)
Having cold intolerance.  No other symptoms.  Had thyroid surgically removed in past.  Compliant with meds.  TSH obtained today.

## 2016-08-08 NOTE — Assessment & Plan Note (Signed)
Patient not a candidate for PPI, she is on Plavix.   - Continue Zantac 150mg  BID - Take Tums prn breakthrough acid

## 2016-08-08 NOTE — Patient Instructions (Signed)
I recommend that you take a Tums for break through acid reflux symptoms.  I will contact you will the results of your labs.  If anything is abnormal, I will call you.  Otherwise, expect a copy to be mailed to you.  Your A1c is 9.0 today. Please discuss this with your Endocrinologist.  Also, please inform them that I am working you up for anemia.

## 2016-08-09 ENCOUNTER — Telehealth (HOSPITAL_COMMUNITY): Payer: Self-pay | Admitting: Family Medicine

## 2016-08-09 ENCOUNTER — Encounter (HOSPITAL_COMMUNITY): Payer: Self-pay | Admitting: Family Medicine

## 2016-08-09 NOTE — Telephone Encounter (Signed)
Called to discuss elevated TSH (reflecting inadequate synthroid dose).  This may be from patient taking her MVI 10 minutes after her synthroid.  We discussed this during her appointment.  She is to take her synthroid 1 hour away from any other meds/ food.  Will send copy to her home.  She has appt with endocrinology scheduled for next week.  Recommend she discuss this with him.  May need dose of synthroid adjusted.  Ashly M. Lajuana Ripple, DO PGY-3, [facility]

## 2016-08-13 ENCOUNTER — Other Ambulatory Visit: Payer: Self-pay

## 2016-08-13 ENCOUNTER — Encounter (HOSPITAL_COMMUNITY): Payer: Self-pay | Admitting: *Deleted

## 2016-08-13 ENCOUNTER — Emergency Department (HOSPITAL_COMMUNITY)
Admission: EM | Admit: 2016-08-13 | Discharge: 2016-08-13 | Disposition: A | Discharge status: Home / Self Care | Payer: Medicare Other | Attending: Emergency Medicine | Admitting: Emergency Medicine

## 2016-08-13 DIAGNOSIS — E039 Hypothyroidism, unspecified: Secondary | ICD-10-CM | POA: Insufficient documentation

## 2016-08-13 DIAGNOSIS — M79602 Pain in left arm: Secondary | ICD-10-CM | POA: Diagnosis not present

## 2016-08-13 DIAGNOSIS — E109 Type 1 diabetes mellitus without complications: Secondary | ICD-10-CM | POA: Diagnosis not present

## 2016-08-13 DIAGNOSIS — Z85850 Personal history of malignant neoplasm of thyroid: Secondary | ICD-10-CM | POA: Diagnosis not present

## 2016-08-13 DIAGNOSIS — Z79899 Other long term (current) drug therapy: Secondary | ICD-10-CM | POA: Insufficient documentation

## 2016-08-13 DIAGNOSIS — I509 Heart failure, unspecified: Secondary | ICD-10-CM | POA: Diagnosis not present

## 2016-08-13 DIAGNOSIS — I110 Hypertensive heart disease with heart failure: Secondary | ICD-10-CM | POA: Diagnosis not present

## 2016-08-13 DIAGNOSIS — Z8673 Personal history of transient ischemic attack (TIA), and cerebral infarction without residual deficits: Secondary | ICD-10-CM | POA: Insufficient documentation

## 2016-08-13 DIAGNOSIS — Z794 Long term (current) use of insulin: Secondary | ICD-10-CM | POA: Diagnosis not present

## 2016-08-13 LAB — CBC WITH DIFFERENTIAL/PLATELET
BASOS PCT: 0 %
Basophils Absolute: 0 10*3/uL (ref 0.0–0.1)
Eosinophils Absolute: 0.1 10*3/uL (ref 0.0–0.7)
Eosinophils Relative: 3 %
HEMATOCRIT: 34.2 % — AB (ref 36.0–46.0)
HEMOGLOBIN: 10.5 g/dL — AB (ref 12.0–15.0)
LYMPHS PCT: 37 %
Lymphs Abs: 1.7 10*3/uL (ref 0.7–4.0)
MCH: 27.9 pg (ref 26.0–34.0)
MCHC: 30.7 g/dL (ref 30.0–36.0)
MCV: 90.7 fL (ref 78.0–100.0)
MONOS PCT: 11 %
Monocytes Absolute: 0.5 10*3/uL (ref 0.1–1.0)
NEUTROS ABS: 2.3 10*3/uL (ref 1.7–7.7)
NEUTROS PCT: 49 %
Platelets: 233 10*3/uL (ref 150–400)
RBC: 3.77 MIL/uL — ABNORMAL LOW (ref 3.87–5.11)
RDW: 13 % (ref 11.5–15.5)
WBC: 4.6 10*3/uL (ref 4.0–10.5)

## 2016-08-13 LAB — I-STAT CHEM 8, ED
BUN: 13 mg/dL (ref 6–20)
CREATININE: 0.8 mg/dL (ref 0.44–1.00)
Calcium, Ion: 1.14 mmol/L (ref 1.12–1.23)
Chloride: 103 mmol/L (ref 101–111)
Glucose, Bld: 199 mg/dL — ABNORMAL HIGH (ref 65–99)
HEMATOCRIT: 35 % — AB (ref 36.0–46.0)
Hemoglobin: 11.9 g/dL — ABNORMAL LOW (ref 12.0–15.0)
POTASSIUM: 3.4 mmol/L — AB (ref 3.5–5.1)
Sodium: 142 mmol/L (ref 135–145)
TCO2: 24 mmol/L (ref 0–100)

## 2016-08-13 LAB — I-STAT TROPONIN, ED: TROPONIN I, POC: 0.01 ng/mL (ref 0.00–0.08)

## 2016-08-13 MED ORDER — ACETAMINOPHEN 325 MG PO TABS
650.0000 mg | ORAL_TABLET | Freq: Once | ORAL | Status: AC
  Administered 2016-08-13: 650 mg via ORAL
  Filled 2016-08-13: qty 2

## 2016-08-13 NOTE — ED Notes (Signed)
Pt wants to take her own morning meds, ok'd by [place], [place]

## 2016-08-13 NOTE — ED Triage Notes (Signed)
PT states the past two mornings when she wakes up she has pain in the left upper arm. Somewhat relieved by tylenol. Denies injury

## 2016-08-13 NOTE — Discharge Instructions (Signed)
Continue to take tylenol for pain. Please follow up with family doctor for recheck. Your lab work and ECG showed no signs of heart attack.

## 2016-08-13 NOTE — ED Provider Notes (Signed)
[facility] DEPT Provider Note   CSN: [id number] Arrival date & time: 08/13/16  0545     History   Chief Complaint Chief Complaint  Patient presents with  . Arm Pain    HPI [name] is a 65 y.o. female.  HPI [name] LIZMAR JUNOT is a 65 y.o. female with hx of CHF, anemia, HTN, CVA, presents to ED with complaint Of left arm pain. Patient states she woke up with this arm pain yesterday morning, and again this morning. She states pain was relieved yesterday with Tylenol. Pain is worse with movement of the arm and palpation of the arm. She denies any chest pain or shortness of breath. She states she has history of stroke and states her mother died from a heart attack, states that she is worried that she might be having a heart attack. She denies any dizziness, diaphoresis, nausea, vomiting. She has not taken any medications for this prior to coming in. She has no other complaints. She has history of similar pain in the past. She denies any injuries or any strenuous activities.  Past Medical History:  Diagnosis Date  . Anemia   . Arthritis   . CHF (congestive heart failure) ([place])   . Depression   . GERD (gastroesophageal reflux disease)   . Hemorrhoids   . Hyperlipidemia   . Hypertension   . Hypothyroidism   . Seasonal allergies   . Stroke ([place])   . Thyroid cancer ([place])    per pt S/p Total Thyroidectomy with Radioactive Iodine Therapy  . Type 1 diabetes mellitus [facility])     Patient Active Problem List   Diagnosis Date Noted  . Snoring 07/31/2016  . Pre-ulcerative corn or callous 12/15/2015  . History of CHF (congestive heart failure) 10/29/2015  . Essential hypertension 10/29/2015  . 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 Toe injury 09/24/2015  . Thyroid activity decreased   . Stroke with cerebral ischemia ([place])   . HLD (hyperlipidemia)   . Cerebral infarction due to thrombosis of basilar artery ([place])   . CVA  (cerebral vascular accident) ([place]) 08/29/2015  . Preventative health care 09/30/2014  . Special screening for malignant neoplasms, colon 09/10/2014  . Great toe pain 07/09/2014  . Posterior tibial tendinitis of right leg 04/26/2014  . Preventive measure 08/20/2013  . Morbid obesity ([place]) 05/29/2013  . Back pain without radiation 04/28/2013  . Nail dystrophy 01/10/2013  . Type 1 diabetes mellitus ([place]) 01/25/2012  . Hyperlipidemia 01/25/2012  . Hypothyroidism 01/25/2012  . Hypertension 01/25/2012  . GERD (gastroesophageal reflux disease) 01/25/2012    Past Surgical History:  Procedure Laterality Date  . ABDOMINAL ANGIOGRAM N/A 05/14/2012   Procedure: ABDOMINAL ANGIOGRAM;  Surgeon: Laverda Page, MD;  Location: [facility] CATH LAB;  Service: Cardiovascular;  Laterality: N/A;  . ABDOMINAL HYSTERECTOMY  1995   partial  . ANKLE FRACTURE SURGERY Right   . CARDIAC CATHETERIZATION     with coronary angiogram  . COLONOSCOPY N/A 09/09/2014   Procedure: COLONOSCOPY;  Surgeon: Gatha Mayer, MD;  Location: [facility];  Service: Endoscopy;  Laterality: N/A;  . LEFT AND RIGHT HEART CATHETERIZATION WITH CORONARY ANGIOGRAM N/A 05/14/2012   Procedure: LEFT AND RIGHT HEART CATHETERIZATION WITH CORONARY ANGIOGRAM;  Surgeon: Laverda Page, MD;  Location: [facility] CATH LAB;  Service: Cardiovascular;  Laterality: N/A;  . MINI METER   09/08/2014   ELECTRONIC INSULIN PUMP  . THYROIDECTOMY  2006    OB History  No data available       Home Medications    Prior to Admission medications   Medication Sig Start Date End Date Taking? Authorizing Provider  acetaminophen (TYLENOL) 500 MG tablet Take 1-2 tablets (500-1,000 mg total) by mouth every 8 (eight) hours as needed for mild pain or moderate pain. 09/02/15   Leeanne Rio, MD  atorvastatin (LIPITOR) 40 MG tablet Take 1 tablet (40 mg total) by mouth daily. 09/02/15   Leeanne Rio, MD  clopidogrel (PLAVIX) 75 MG tablet TAKE 1 TABLET(75 MG) BY  MOUTH DAILY 12/13/15   Ashly Windell Moulding, DO  fexofenadine (ALLEGRA) 30 MG tablet Take 30 mg by mouth daily as needed.     Historical Provider, MD  insulin lispro (HUMALOG) 100 UNIT/ML injection Inject 10 Units into the skin 3 (three) times daily with meals.     Historical Provider, MD  levothyroxine (SYNTHROID, LEVOTHROID) 150 MCG tablet Take 1 tablet (150 mcg total) by mouth daily before breakfast. 02/22/16   Janora Norlander, DO  losartan-hydrochlorothiazide (HYZAAR) 50-12.5 MG tablet TAKE 1 TABLET BY MOUTH DAILY 05/18/16   Janora Norlander, DO  Multiple Vitamin (MULTIVITAMIN WITH MINERALS) TABS tablet Take 1 tablet by mouth daily.    Historical Provider, MD  Glory Rosebush VERIO test strip U UTD TID 01/28/16   Historical Provider, MD  potassium gluconate 595 (99 K) MG TABS tablet Take 595 mg by mouth daily.    Historical Provider, MD  Probiotic Product (PROBIOTIC PO) Take 1 tablet by mouth daily.    Historical Provider, MD  ranitidine (ZANTAC) 150 MG tablet Take 150 mg by mouth 2 (two) times daily.    Historical Provider, MD    Family History Family History  Problem Relation Age of Onset  . Heart disease Mother     MI  . Hyperlipidemia Mother   . Hypertension Mother   . Renal Disease Mother     insufficiency  . Early death Father     Head Injury at Work  . Hyperlipidemia Father   . Hypertension Father   . Stroke Son   . Diabetes Maternal Aunt   . Prostate cancer Paternal Uncle   . Heart disease Maternal Aunt     Social History Social History  Substance Use Topics  . Smoking status: Never Smoker  . Smokeless tobacco: Never Used  . Alcohol use No     Allergies   Review of patient's allergies indicates no known allergies.   Review of Systems Review of Systems  Constitutional: Negative for chills and fever.  Respiratory: Negative for cough, chest tightness and shortness of breath.   Cardiovascular: Negative for chest pain, palpitations and leg swelling.  Gastrointestinal:  Negative for abdominal pain, diarrhea, nausea and vomiting.  Genitourinary: Negative for dysuria, flank pain and pelvic pain.  Musculoskeletal: Positive for arthralgias and myalgias. Negative for neck pain and neck stiffness.  Skin: Negative for rash.  Neurological: Negative for dizziness, weakness and headaches.  All other systems reviewed and are negative.    Physical Exam Updated Vital Signs BP 137/57   Pulse 70   Temp 98.2 F (36.8 C)   Resp 16   Ht 5\' 7"  (1.702 m)   Wt 117.9 kg   SpO2 100%   BMI 40.72 kg/m   Physical Exam  Constitutional: She appears well-developed and well-nourished. No distress.  HENT:  Head: Normocephalic.  Eyes: Conjunctivae are normal.  Neck: Neck supple.  Cardiovascular: Normal rate, regular rhythm and normal heart sounds.  Pulmonary/Chest: Effort normal and breath sounds normal. No respiratory distress. She has no wheezes. She has no rales.  Abdominal: Soft. Bowel sounds are normal. She exhibits no distension. There is no tenderness. There is no rebound.  Musculoskeletal: She exhibits no edema.  ttp over left shoulder and upper arm, mainly tricep. Full rom of the shoulder. Pain with abduction and external rotation of the shoulder. Distal radial pulses intact. Normal elbow and wrist. Grip strength 5/5  Neurological: She is alert.  Skin: Skin is warm and dry. Capillary refill takes less than 2 seconds.  Psychiatric: She has a normal mood and affect. Her behavior is normal.  Nursing note and vitals reviewed.    ED Treatments / Results  Labs (all labs ordered are listed, but only abnormal results are displayed) Labs Reviewed  CBC WITH DIFFERENTIAL/PLATELET - Abnormal; Notable for the following:       Result Value   RBC 3.77 (*)    Hemoglobin 10.5 (*)    HCT 34.2 (*)    All other components within normal limits  I-STAT CHEM 8, ED - Abnormal; Notable for the following:    Potassium 3.4 (*)    Glucose, Bld 199 (*)    Hemoglobin 11.9 (*)     HCT 35.0 (*)    All other components within normal limits  I-STAT TROPOININ, ED    EKG  EKG Interpretation  Date/Time:  Sunday August 13 2016 05:58:52 EDT Ventricular Rate:  69 PR Interval:    QRS Duration: 91 QT Interval:  450 QTC Calculation: 483 R Axis:   27 Text Interpretation:  Sinus rhythm Atrial premature complex Borderline T wave abnormalities When compared with ECG of 02/20/2016, No significant change was found Confirmed by [facility]  MD, DAVID ([id number]) on 08/13/2016 6:14:41 AM       Radiology No results found.  Procedures Procedures (including critical care time)  Medications Ordered in ED Medications - No data to display   Initial Impression / Assessment and Plan / ED Course  I have reviewed the triage vital signs and the nursing notes.  Pertinent labs & imaging results that were available during my care of the patient were reviewed by me and considered in my medical decision making (see chart for details).  Clinical Course  Patient in emergency department with left arm pain onset this morning. Similar pain yesterday morning. Patient has been seen in emergency department for the same arm pain in the past. She states she is worried she may be having a heart attack. She denies any chest pain, shortness of breath, diaphoresis, dizziness. EKG is unchanged from prior. Vital signs are normal. Will check one set of enzymes given patient had same pain yesterday. Pain is reproducible on palpation of the arm and movement of the arm.   7:41 AM Troponin negative. Patient continues to have normal vital signs, she is in no acute distress. Most likely muscular pain. Will discharge home with Tylenol for pain as needed. Follow up with pcp.   Final Clinical Impressions(s) / ED Diagnoses   Final diagnoses:  Left arm pain    New Prescriptions New Prescriptions   No medications on file     Jeannett Senior, PA-C [id number] [phone]    David Glick, MD [id number] [phone]

## 2016-08-23 ENCOUNTER — Ambulatory Visit
Admission: RE | Admit: 2016-08-23 | Discharge: 2016-08-23 | Disposition: A | Discharge status: Home / Self Care | Payer: Medicare Other | Source: Ambulatory Visit | Attending: Family Medicine | Admitting: Family Medicine

## 2016-08-23 DIAGNOSIS — Z1231 Encounter for screening mammogram for malignant neoplasm of breast: Secondary | ICD-10-CM

## 2016-08-29 ENCOUNTER — Encounter: Payer: Self-pay | Admitting: Family Medicine

## 2016-08-29 ENCOUNTER — Ambulatory Visit (INDEPENDENT_AMBULATORY_CARE_PROVIDER_SITE_OTHER): Payer: Medicare Other | Admitting: Family Medicine

## 2016-08-29 VITALS — BP 148/72 | HR 70 | Temp 98.6°F | Wt 268.0 lb

## 2016-08-29 DIAGNOSIS — R300 Dysuria: Secondary | ICD-10-CM

## 2016-08-29 LAB — POCT URINALYSIS DIPSTICK
Bilirubin, UA: NEGATIVE
Glucose, UA: 100
KETONES UA: NEGATIVE
Nitrite, UA: NEGATIVE
PH UA: 7
PROTEIN UA: 100
SPEC GRAV UA: 1.025
UROBILINOGEN UA: 0.2

## 2016-08-29 LAB — POCT UA - MICROSCOPIC ONLY

## 2016-08-29 MED ORDER — CEPHALEXIN 500 MG PO CAPS: 500.0000 mg | ORAL_CAPSULE | Freq: Two times a day (BID) | ORAL | 0 refills | Status: DC

## 2016-08-29 NOTE — Patient Instructions (Signed)

## 2016-08-29 NOTE — Addendum Note (Signed)
Addended by: [place] Pink on: 08/29/2016 03:05 PM   Modules accepted: Orders

## 2016-08-29 NOTE — Progress Notes (Signed)
    Subjective:  [name] is a 65 y.o. female who presents to the [facility] today for same day appointment with a chief complaint of dysuria HPI:  Dysuria Started this morning. Consistent with past UTIs. Also with increased frequency and urge this morning. No fevers or chills. No nausea or vomiting. No back pain. Blood sugar readings have been more elevated over the past week, she thought that this may have been due to her endocrinologist recently changing her insulin dosing.  ROS: Per HPI  Objective:  Physical Exam: BP (!) 148/72   Pulse 70   Temp 98.6 F (37 C) (Oral)   Wt 268 lb (121.6 kg)   BMI 41.97 kg/m   Gen: NAD, resting comfortably CV: RRR with no murmurs appreciated Pulm: NWOB, CTAB with no crackles, wheezes, or rhonchi GI: Normal bowel sounds present. Soft, Nontender, Nondistended. MSK: no edema, cyanosis, or clubbing noted. No CVA tenderness. Skin: warm, dry Neuro: grossly normal, moves all extremities Psych: Normal affect and thought content  Results for orders placed or performed in visit on 08/29/16 (from the past 72 hour(s))  POCT urinalysis dipstick     Status: Abnormal   Collection Time: 08/29/16  1:40 PM  Result Value Ref Range   Color, UA YELLOW    Clarity, UA CLOUDY    Glucose, UA 100    Bilirubin, UA NEG    Ketones, UA NEG    Spec Grav, UA 1.025    Blood, UA LARGE    pH, UA 7.0    Protein, UA 100    Urobilinogen, UA 0.2    Nitrite, UA NEG    Leukocytes, UA large (3+) (A) Negative     Assessment/Plan:  Dysuria UA consistent with UTI. Will send treat with 7 day course of keflex (past urine cultures have been sensitive to this). Will send for culture. Return precautions reviewed. Follow up as needed.   Algis Greenhouse. Jerline Pain, [facility] Medicine Resident PGY-3 08/29/2016 2:01 PM

## 2016-08-31 ENCOUNTER — Encounter: Payer: Self-pay | Admitting: Family Medicine

## 2016-09-01 ENCOUNTER — Telehealth: Payer: Self-pay | Admitting: Family Medicine

## 2016-09-01 LAB — URINE CULTURE

## 2016-09-01 MED ORDER — NITROFURANTOIN MONOHYD MACRO 100 MG PO CAPS: 100.0000 mg | ORAL_CAPSULE | Freq: Two times a day (BID) | ORAL | 0 refills | Status: DC

## 2016-09-01 NOTE — Telephone Encounter (Signed)
Called patient to inform of urine culture results. Her culture is resistant to cefazolin. Will switch to macrobid. Return precautions reviewed.  Algis Greenhouse. Jerline Pain, [facility] Resident PGY-3 09/01/2016 1:54 PM

## 2016-10-16 ENCOUNTER — Encounter: Payer: Self-pay | Admitting: *Deleted

## 2016-10-16 DIAGNOSIS — Z006 Encounter for examination for normal comparison and control in clinical research program: Secondary | ICD-10-CM

## 2016-10-16 NOTE — Progress Notes (Signed)
STROKE-AF Research Study month 12 appointment completed. Patient doing well, no recurrent Stroke. No medication changes. Next research required appointment will be due no later than 06/APR/2018. Questions encouraged and answered.

## 2016-10-17 ENCOUNTER — Other Ambulatory Visit: Payer: Self-pay | Admitting: Family Medicine

## 2016-11-10 ENCOUNTER — Other Ambulatory Visit: Payer: Self-pay | Admitting: Pharmacist

## 2016-11-10 NOTE — Patient Outreach (Signed)
Receive a voicemail from [name]. Outreach call to [employer] regarding her request for follow up from the [facility] Medication Adherence Campaign. Left a HIPAA compliant message on the patient's voicemail.   Harlow Asa, PharmD Clinical Pharmacist [facility] [phone]

## 2016-11-15 ENCOUNTER — Other Ambulatory Visit: Payer: Self-pay | Admitting: Pharmacist

## 2016-11-15 NOTE — Patient Outreach (Signed)
Receive a voicemail from [name]. Called and spoke with patient. HIPAA identifiers verified and verbal consent received.  [name] reports that she has been taking her atorvastatin as directed. Denies any barriers to taking her medications such as cost or side effects. Reports that she uses the alarm on her phone to help her to remember to take her medications. Reports that she rarely misses a dose of her medications.   Patient reports that she has no medication questions or concerns at this time. Confirm that patient has my phone number.  Harlow Asa, PharmD Clinical Pharmacist [facility] [phone]

## 2016-11-24 ENCOUNTER — Ambulatory Visit (INDEPENDENT_AMBULATORY_CARE_PROVIDER_SITE_OTHER): Payer: Medicare Other | Admitting: Family Medicine

## 2016-11-24 ENCOUNTER — Encounter: Payer: Self-pay | Admitting: Family Medicine

## 2016-11-24 VITALS — BP 141/66 | HR 82 | Temp 98.3°F | Wt 264.0 lb

## 2016-11-24 DIAGNOSIS — N390 Urinary tract infection, site not specified: Secondary | ICD-10-CM

## 2016-11-24 DIAGNOSIS — Z23 Encounter for immunization: Secondary | ICD-10-CM

## 2016-11-24 DIAGNOSIS — R300 Dysuria: Secondary | ICD-10-CM | POA: Diagnosis not present

## 2016-11-24 DIAGNOSIS — R81 Glycosuria: Secondary | ICD-10-CM | POA: Diagnosis not present

## 2016-11-24 LAB — POCT URINALYSIS DIPSTICK
BILIRUBIN UA: NEGATIVE
Glucose, UA: >1000
KETONES UA: NEGATIVE
NITRITE UA: NEGATIVE
PH UA: 6
Protein, UA: 100
Spec Grav, UA: 1.02
Urobilinogen, UA: NEGATIVE

## 2016-11-24 LAB — POCT UA - MICROSCOPIC ONLY

## 2016-11-24 LAB — GLUCOSE, POCT (MANUAL RESULT ENTRY): POC Glucose: 330 mg/dl — AB (ref 70–99)

## 2016-11-24 MED ORDER — CIPROFLOXACIN HCL 500 MG PO TABS: 500.0000 mg | ORAL_TABLET | Freq: Two times a day (BID) | ORAL | 0 refills | Status: DC

## 2016-11-24 NOTE — Patient Instructions (Signed)
Your exam is consistent with a urinary tract infection. Because of your poorly controlled diabetes, I prescribed an antibiotic, ciprofloxacin which she will take for 5 days total If you symptoms fail to improve or worsen, please follow-up with [place] Note fevers, chills, inability to take food, nausea, or vomiting,  or difficulty with urination please follow up with [place]. Ciprofloxacin tablets What is this medicine? CIPROFLOXACIN (sip roe FLOX a sin) is a quinolone antibiotic. It is used to treat certain kinds of bacterial infections. It will not work for colds, flu, or other viral infections. This medicine may be used for other purposes; ask your health care provider or pharmacist if you have questions. COMMON BRAND NAME(S): Cipro What should I tell my health care provider before I take this medicine? They need to know if you have any of these conditions: -bone problems -history of low levels of potassium in the blood -joint problems -irregular heartbeat -kidney disease -myasthenia gravis -seizures -tendon problems -tingling of the fingers or toes, or other nerve disorder -an unusual or allergic reaction to ciprofloxacin, other antibiotics or medicines, foods, dyes, or preservatives -pregnant or trying to get pregnant -breast-feeding How should I use this medicine? Take this medicine by mouth with a glass of water. Follow the directions on the prescription label. Take your medicine at regular intervals. Do not take your medicine more often than directed. Take all of your medicine as directed even if you think your are better. Do not skip doses or stop your medicine early. You can take this medicine with food or on an empty stomach. It can be taken with a meal that contains dairy or calcium, but do not take it alone with a dairy product, like milk or yogurt or calcium-fortified juice. A special MedGuide will be given to you by the pharmacist with each prescription and refill. Be sure to read this  information carefully each time. Talk to your pediatrician regarding the use of this medicine in children. Special care may be needed. Overdosage: If you think you have taken too much of this medicine contact a poison control center or emergency room at once. NOTE: This medicine is only for you. Do not share this medicine with others. What if I miss a dose? If you miss a dose, take it as soon as you can. If it is almost time for your next dose, take only that dose. Do not take double or extra doses. What may interact with this medicine? Do not take this medicine with any of the following medications: -cisapride -dofetilide -dronedarone -flibanserin -lomitapide -pimozide -thioridazine -tizanidine -ziprasidone This medicine may also interact with the following medications: -antacids -birth control pills -caffeine -certain medicines for diabetes, like glipizide or glyburide -certain medicines that treat or prevent blood clots like warfarin -clozapine -cyclosporine -didanosine (ddI) buffered tablets or powder -duloxetine -lanthanum carbonate -lidocaine -methotrexate -multivitamins -NSAIDS, medicines for pain and inflammation, like ibuprofen or naproxen -olanzapine -omeprazole -other medicines that prolong the QT interval (cause an abnormal heart rhythm) -phenytoin -probenecid -ropinirole -sevelamer -sildenafil -sucralfate -theophylline -zolpidem This list may not describe all possible interactions. Give your health care provider a list of all the medicines, herbs, non-prescription drugs, or dietary supplements you use. Also tell them if you smoke, drink alcohol, or use illegal drugs. Some items may interact with your medicine. What should I watch for while using this medicine? Tell your doctor or health care professional if your symptoms do not improve. Do not treat diarrhea with over the counter products. Contact your  doctor if you have diarrhea that lasts more than 2 days  or if it is severe and watery. You may get drowsy or dizzy. Do not drive, use machinery, or do anything that needs mental alertness until you know how this medicine affects you. Do not stand or sit up quickly, especially if you are an older patient. This reduces the risk of dizzy or fainting spells. This medicine can make you more sensitive to the sun. Keep out of the sun. If you cannot avoid being in the sun, wear protective clothing and use sunscreen. Do not use sun lamps or tanning beds/booths. Avoid antacids, aluminum, calcium, iron, magnesium, and zinc products for 6 hours before and 2 hours after taking a dose of this medicine. What side effects may I notice from receiving this medicine? Side effects that you should report to your doctor or health care professional as soon as possible: -allergic reactions like skin rash or hives, swelling of the face, lips, or tongue -anxious -confusion -depressed mood -diarrhea -fast, irregular heartbeat -hallucination, loss of contact with reality -joint, muscle, or tendon pain or swelling -pain, tingling, numbness in the hands or feet -suicidal thoughts or other mood changes -sunburn -unusually weak or tired Side effects that usually do not require medical attention (report to your doctor or health care professional if they continue or are bothersome): -dry mouth -headache -nausea -trouble sleeping This list may not describe all possible side effects. Call your doctor for medical advice about side effects. You may report side effects to FDA at 1-800-FDA-1088. Where should I keep my medicine? Keep out of the reach of children. Store at room temperature below 30 degrees C (86 degrees F). Keep container tightly closed. Throw away any unused medicine after the expiration date. NOTE: This sheet is a summary. It may not cover all possible information. If you have questions about this medicine, talk to your doctor, pharmacist, or health care provider.   2017 Elsevier/Gold Standard (2016-07-21 14:42:02)

## 2016-11-24 NOTE — Progress Notes (Signed)
Subjective: [id number] HPI: Patient is a 65 y.o. female with a past medical history of T1DM presenting to clinic today for dysuria.  She started having dysuria this morning. She notes she has some incomplete bladder emptying as well. She denies urinary frequency or urgency. No fevers or chills. No unusual vaginal discharge. No genital lesions or concerns for STDs. No flank pain or hematuria.   Social History: never smoker  Health Maintenance:  Due for flu vaccine and pneumococcal vaccine 13 valent   ROS: All other systems reviewed and are negative.  Past Medical History Patient Active Problem List   Diagnosis Date Noted  . Snoring 07/31/2016  . Pre-ulcerative corn or callous 12/15/2015  . History of CHF (congestive heart failure) 10/29/2015  . Essential hypertension 10/29/2015  . Cerebrovascular accident (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 ([place]) 10/29/2015  . Toe injury 09/24/2015  . Thyroid activity decreased   . Stroke with cerebral ischemia ([place])   . HLD (hyperlipidemia)   . Cerebral infarction due to thrombosis of basilar artery ([place])   . CVA (cerebral vascular accident) ([place]) 08/29/2015  . Preventative health care 09/30/2014  . Special screening for malignant neoplasms, colon 09/10/2014  . Great toe pain 07/09/2014  . Posterior tibial tendinitis of right leg 04/26/2014  . Complicated UTI (urinary tract infection) 12/24/2013  . Preventive measure 08/20/2013  . Morbid obesity ([place]) 05/29/2013  . Back pain without radiation 04/28/2013  . Nail dystrophy 01/10/2013  . Type 1 diabetes mellitus ([place]) 01/25/2012  . Hyperlipidemia 01/25/2012  . Hypothyroidism 01/25/2012  . Hypertension 01/25/2012  . GERD (gastroesophageal reflux disease) 01/25/2012    Medications- reviewed and updated  Objective: Office vital signs reviewed. BP (!) 141/66   Pulse 82   Temp 98.3 F (36.8 C)   Wt 264 lb (119.7 kg)   BMI 41.35 kg/m    Physical Examination:  General: Awake,  alert, well- nourished, NAD Cardio: RRR, no m/r/g noted.  Pulm: No increased WOB.  CTAB, without wheezes, rhonchi or crackles noted.  GI: +BS, soft, non-distended, non-tender.   Urinalysis    Component Value Date/Time   COLORURINE YELLOW 08/29/2015 [place] 08/29/2015 0947   LABSPEC 1.004 (L) 08/29/2015 0947   PHURINE 6.5 08/29/2015 0947   GLUCOSEU NEGATIVE 08/29/2015 0947   HGBUR NEGATIVE 08/29/2015 0947   BILIRUBINUR Negative 11/24/2016 0922   KETONESUR NEGATIVE 08/29/2015 0947   PROTEINUR 100 mg 11/24/2016 0922   PROTEINUR NEGATIVE 08/29/2015 0947   UROBILINOGEN negative 11/24/2016 0922   UROBILINOGEN 0.2 08/29/2015 0947   NITRITE Negative 11/24/2016 0922   NITRITE NEGATIVE 08/29/2015 0947   LEUKOCYTESUR large (3+) (A) 11/24/2016 0922   Micro 1+ bacteria, 0-3 epithelials, TMTC WBCs  CBG 330  Assessment/Plan: Complicated UTI (urinary tract infection) UA is consistent with urinary tract infection. The patient's diabetes is not under control. She has greater than 1000 glucose in urine and has a CBG of 330 in clinic. On EMR review, she's had glucosuria to this extent before. No N/V, abdominal pain, polyuria, or polydipsia. Pt to change her bolus settings (notes she didn't bolus her insulin after eating today) -Prescribed ciprofloxacin 500 mg twice a day for 5 days, discussed potential side effects -Discussed return precautions with the patient -Urine culture ordered.   Pneumococcal valent 13 given. Pt had flu vaccine at pharmacy in October.  Orders Placed This Encounter  Procedures  . Urine culture  . Pneumococcal conjugate vaccine 13-valent  . Urinalysis Dipstick  . POCT  glucose (manual entry)  . POCT UA - Microscopic Only    Meds ordered this encounter  Medications  . ciprofloxacin (CIPRO) 500 MG tablet    Sig: Take 1 tablet (500 mg total) by mouth 2 (two) times daily.    Dispense:  10 tablet    Refill:  [place] PGY-3, [place]

## 2016-11-24 NOTE — Assessment & Plan Note (Addendum)
UA is consistent with urinary tract infection. The patient's diabetes is not under control. She has greater than 1000 glucose in urine and has a CBG of 330 in clinic. On EMR review, she's had glucosuria to this extent before. No N/V, abdominal pain, polyuria, or polydipsia. Pt to change her bolus settings (notes she didn't bolus her insulin after eating today) -Prescribed ciprofloxacin 500 mg twice a day for 5 days, discussed potential side effects -Discussed return precautions with the patient -Urine culture ordered.

## 2016-11-27 LAB — URINE CULTURE

## 2016-11-28 ENCOUNTER — Other Ambulatory Visit: Payer: Self-pay | Admitting: Family Medicine

## 2016-12-25 HISTORY — PX: OTHER SURGICAL HISTORY: SHX169

## 2016-12-28 LAB — HM DIABETES EYE EXAM

## 2017-01-19 ENCOUNTER — Telehealth: Payer: Self-pay | Admitting: *Deleted

## 2017-01-19 NOTE — Telephone Encounter (Signed)
Called patient to offer to schedule Annual Wellness Visit. Patient is having bilat cataract surgery in Feb/March 2018. Would like a call back to schedule in April 2018 L. Silvano Rusk, RN, BSN

## 2017-02-07 ENCOUNTER — Encounter: Payer: Self-pay | Admitting: *Deleted

## 2017-02-07 DIAGNOSIS — Z006 Encounter for examination for normal comparison and control in clinical research program: Secondary | ICD-10-CM

## 2017-02-08 NOTE — Progress Notes (Signed)
STROKE-AF research study month 18 office visit completed. No changes in medications noted. Next required research visit is due no later than 04/OCT/2018.

## 2017-05-23 ENCOUNTER — Telehealth: Payer: Self-pay | Admitting: *Deleted

## 2017-05-23 NOTE — Telephone Encounter (Signed)
Spoke with patient about the STROKE-AF research Study ICF with incorrect date. I explained to patient that I would mail the consent back and need her to line through 2016 add 2017 initial and date correction. Patient was very happy to correct error and apologized.

## 2017-05-31 ENCOUNTER — Encounter: Payer: Self-pay | Admitting: *Deleted

## 2017-05-31 DIAGNOSIS — Z006 Encounter for examination for normal comparison and control in clinical research program: Secondary | ICD-10-CM

## 2017-05-31 NOTE — Progress Notes (Addendum)
STROKE-AF Note: Received Research HIPPA signature page in mail from patient with the signature date 2016 lined through initialed, dated and the correct year 2017 . ( HIPPA ICF Version 3, Revised 08/11) Copy mailed to patient.

## 2017-06-13 ENCOUNTER — Encounter: Payer: Self-pay | Admitting: Family Medicine

## 2017-06-13 ENCOUNTER — Ambulatory Visit (INDEPENDENT_AMBULATORY_CARE_PROVIDER_SITE_OTHER): Payer: Medicare Other | Admitting: Family Medicine

## 2017-06-13 ENCOUNTER — Ambulatory Visit (HOSPITAL_COMMUNITY)
Admission: RE | Admit: 2017-06-13 | Discharge: 2017-06-13 | Disposition: A | Discharge status: Home / Self Care | Payer: Medicare Other | Source: Ambulatory Visit | Attending: Family Medicine | Admitting: Family Medicine

## 2017-06-13 VITALS — BP 152/80 | HR 71 | Temp 98.2°F | Wt 262.0 lb

## 2017-06-13 DIAGNOSIS — I491 Atrial premature depolarization: Secondary | ICD-10-CM | POA: Insufficient documentation

## 2017-06-13 DIAGNOSIS — I10 Essential (primary) hypertension: Secondary | ICD-10-CM

## 2017-06-13 DIAGNOSIS — H8111 Benign paroxysmal vertigo, right ear: Secondary | ICD-10-CM

## 2017-06-13 DIAGNOSIS — E109 Type 1 diabetes mellitus without complications: Secondary | ICD-10-CM

## 2017-06-13 HISTORY — DX: Benign paroxysmal vertigo, right ear: H81.11

## 2017-06-13 LAB — POCT GLYCOSYLATED HEMOGLOBIN (HGB A1C): Hemoglobin A1C: 9.8

## 2017-06-13 MED ORDER — LOSARTAN POTASSIUM-HCTZ 100-12.5 MG PO TABS: 1.0000 | ORAL_TABLET | Freq: Every day | ORAL | 0 refills | Status: DC

## 2017-06-13 NOTE — Assessment & Plan Note (Signed)
Followed by Dr Chalmers Cater.  Her A1c continues to rise.  I voiced concern over this at today's appointment.  She is scheduled to see Dr Chalmers Cater next month for new insulin pump.  I recommended that she call and inform of A1c, and perhaps get new pump sooner than later.

## 2017-06-13 NOTE — Assessment & Plan Note (Signed)
Hyzaar increased to 100/12.5mg  dose.  She will have BMP checked on Tuesday at follow up.  If persistently uncontrolled will plan to increase to 100/25mg  dose.  Given patient's report of dizziness, I wanted to gradually titrate medication so as to not exacerbate her symptoms.  Goal <130/80 (per Neuro)

## 2017-06-13 NOTE — Patient Instructions (Addendum)
I have scheduled a follow up with me on Tuesday.  You can start your new blood pressure medication tomorrow.  We will plan to check your kidney function on Tuesday.  I have placed a referral to physical therapy for you for your dizziness.  This is probably BPPV.   Benign Positional Vertigo Vertigo is the feeling that you or your surroundings are moving when they are not. Benign positional vertigo is the most common form of vertigo. The cause of this condition is not serious (is benign). This condition is triggered by certain movements and positions (is positional). This condition can be dangerous if it occurs while you are doing something that could endanger you or others, such as driving. What are the causes? In many cases, the cause of this condition is not known. It may be caused by a disturbance in an area of the inner ear that helps your brain to sense movement and balance. This disturbance can be caused by a viral infection (labyrinthitis), head injury, or repetitive motion. What increases the risk? This condition is more likely to develop in:  Women.  People who are 59 years of age or older.  What are the signs or symptoms? Symptoms of this condition usually happen when you move your head or your eyes in different directions. Symptoms may start suddenly, and they usually last for less than a minute. Symptoms may include:  Loss of balance and falling.  Feeling like you are spinning or moving.  Feeling like your surroundings are spinning or moving.  Nausea and vomiting.  Blurred vision.  Dizziness.  Involuntary eye movement (nystagmus).  Symptoms can be mild and cause only slight annoyance, or they can be severe and interfere with daily life. Episodes of benign positional vertigo may return (recur) over time, and they may be triggered by certain movements. Symptoms may improve over time. How is this diagnosed? This condition is usually diagnosed by medical history and a  physical exam of the head, neck, and ears. You may be referred to a health care provider who specializes in ear, nose, and throat (ENT) problems (otolaryngologist) or a provider who specializes in disorders of the nervous system (neurologist). You may have additional testing, including:  MRI.  A CT scan.  Eye movement tests. Your health care provider may ask you to change positions quickly while he or she watches you for symptoms of benign positional vertigo, such as nystagmus. Eye movement may be tested with an electronystagmogram (ENG), caloric stimulation, the Dix-Hallpike test, or the roll test.  An electroencephalogram (EEG). This records electrical activity in your brain.  Hearing tests.  How is this treated? Usually, your health care provider will treat this by moving your head in specific positions to adjust your inner ear back to normal. Surgery may be needed in severe cases, but this is rare. In some cases, benign positional vertigo may resolve on its own in 2-4 weeks. Follow these instructions at home: Safety  Move slowly.Avoid sudden body or head movements.  Avoid driving.  Avoid operating heavy machinery.  Avoid doing any tasks that would be dangerous to you or others if a vertigo episode would occur.  If you have trouble walking or keeping your balance, try using a cane for stability. If you feel dizzy or unstable, sit down right away.  Return to your normal activities as told by your health care provider. Ask your health care provider what activities are safe for you. General instructions  Take over-the-counter and prescription medicines  only as told by your health care provider.  Avoid certain positions or movements as told by your health care provider.  Drink enough fluid to keep your urine clear or pale yellow.  Keep all follow-up visits as told by your health care provider. This is important. Contact a health care provider if:  You have a fever.  Your  condition gets worse or you develop new symptoms.  Your family or friends notice any behavioral changes.  Your nausea or vomiting gets worse.  You have numbness or a "pins and needles" sensation. Get help right away if:  You have difficulty speaking or moving.  You are always dizzy.  You faint.  You develop severe headaches.  You have weakness in your legs or arms.  You have changes in your hearing or vision.  You develop a stiff neck.  You develop sensitivity to light. This information is not intended to replace advice given to you by your health care provider. Make sure you discuss any questions you have with your health care provider. Document Released: 09/18/2006 Document Revised: 05/18/2016 Document Reviewed: 04/05/2015 Elsevier Interactive Patient Education  [employer].

## 2017-06-13 NOTE — Assessment & Plan Note (Signed)
Referral to vestibular PT placed.

## 2017-06-13 NOTE — Assessment & Plan Note (Signed)
Irregular heart beats on exam today.  EKG performed.  Beats were PACs.  No evidence of ischemia or other abnormalities.  Results discussed with patient and copy of EKG provided for her records.

## 2017-06-13 NOTE — Progress Notes (Signed)
    Subjective: CC: HTN/ DM1 HPI: [name] is a 66 y.o. female presenting to clinic today for:  1. Hypertension Blood pressure at home: 150s SBP Blood pressure today: 152/80 Meds: Compliant with Hyzaar 50/12.5mg  ROS: Reports dizziness.  Denies headache, visual changes, nausea, vomiting, chest pain, abdominal pain or shortness of breath.  2. Diabetes Patient sees Dr [name].  She has an appt w/ her next month.  She reports that her pump goes out of warranty soon.  She reports compliance with insulin pump.  She reports that her BGs at home seem better and she is exercising daily. High at home: 213 Low at home: no hypoglycemic episodes Taking medications: Humalog ROS: denies fever, chills, dizziness, LOC, polyuria, polydipsia, numbness or tingling in extremities or chest pain. Last eye exam: Sees Dr [name]  Last A1c: 9.0  Nephropathy screen indicated?: on ARB  3. Dizziness Patient reports dizziness with turning her head to the right that started about 2 weeks ago.  She reports associated nausea without vomiting.  No new weakness, balance problems or visual disturbance  Social Hx reviewed. MedHx, medications and allergies reviewed.  Please see EMR. ROS: Per HPI  Objective: Office vital signs reviewed. BP (!) 152/80   Pulse 71   Temp 98.2 F (36.8 C) (Oral)   Wt 262 lb (118.8 kg)   SpO2 97%   BMI 41.04 kg/m   Physical Examination:  General: Awake, alert, well nourished, No acute distress HEENT: Normal, MMM Cardio: intermittent extra heart beats, rate normal, S1S2 heard, no murmurs appreciated Pulm: clear to auscultation bilaterally, no wheezes, rhonchi or rales; normal work of breathing on room air Neuro: no nystagmus  Results for orders placed or performed in visit on 06/13/17 (from the past 24 hour(s))  HgB A1c     Status: Abnormal   Collection Time: 06/13/17  8:30 AM  Result Value Ref Range   Hemoglobin A1C 9.8    Assessment/ Plan: 66 y.o. female    Hypertension Hyzaar increased to 100/12.5mg  dose.  She will have BMP checked on Tuesday at follow up.  If persistently uncontrolled will plan to increase to 100/25mg  dose.  Given patient's report of dizziness, I wanted to gradually titrate medication so as to not exacerbate her symptoms.  Goal <130/80 (per Neuro)  Type 1 diabetes mellitus Followed by Dr [name].  Her A1c continues to rise.  I voiced concern over this at today's appointment.  She is scheduled to see Dr [name] next month for new insulin pump.  I recommended that she call and inform of A1c, and perhaps get new pump sooner than later.    Premature atrial beats Irregular heart beats on exam today.  EKG performed.  Beats were PACs.  No evidence of ischemia or other abnormalities.  Results discussed with patient and copy of EKG provided for her records.  BPPV (benign paroxysmal positional vertigo), right Referral to vestibular PT placed.   [name] Norlander, DO PGY-3, [facility]

## 2017-06-14 LAB — BASIC METABOLIC PANEL
BUN / CREAT RATIO: 15 (ref 12–28)
BUN: 14 mg/dL (ref 8–27)
CHLORIDE: 101 mmol/L (ref 96–106)
CO2: 28 mmol/L (ref 20–29)
CREATININE: 0.91 mg/dL (ref 0.57–1.00)
Calcium: 9.4 mg/dL (ref 8.7–10.3)
GFR calc Af Amer: 76 mL/min/{1.73_m2} (ref >59)
GFR calc non Af Amer: 66 mL/min/{1.73_m2} (ref >59)
GLUCOSE: 203 mg/dL — AB (ref 65–99)
Potassium: 4.6 mmol/L (ref 3.5–5.2)
SODIUM: 141 mmol/L (ref 134–144)

## 2017-06-15 ENCOUNTER — Ambulatory Visit: Payer: Medicare Other | Attending: Addiction Medicine | Admitting: Rehabilitative and Restorative Service Providers"

## 2017-06-15 DIAGNOSIS — R42 Dizziness and giddiness: Secondary | ICD-10-CM | POA: Diagnosis present

## 2017-06-15 DIAGNOSIS — H8111 Benign paroxysmal vertigo, right ear: Secondary | ICD-10-CM | POA: Insufficient documentation

## 2017-06-15 NOTE — Therapy (Signed)
[facility] [address] [place], [place], [address] Phone: [phone]   Fax:  [phone]  Physical Therapy Evaluation  Patient Details  Name: [name] MRN: [id number] Date of Birth: 25-Apr-1951 Referring Provider: Adam Phenix, MD  Encounter Date: 06/15/2017      PT End of Session - 06/15/17 1542    Visit Number 1   Number of Visits 4   Date for PT Re-Evaluation 07/15/17   Authorization Type [place] code every 10th visit   PT Start Time 1450   PT Stop Time 1530   PT Time Calculation (min) 40 min   Activity Tolerance Patient tolerated treatment well   Behavior During Therapy [facility] for tasks assessed/performed      Past Medical History:  Diagnosis Date  . Anemia   . Arthritis   . CHF (congestive heart failure) ([place])   . Depression   . GERD (gastroesophageal reflux disease)   . Hemorrhoids   . Hyperlipidemia   . Hypertension   . Hypothyroidism   . Seasonal allergies   . Stroke ([place])   . Thyroid cancer ([place])    per pt S/p Total Thyroidectomy with Radioactive Iodine Therapy  . Type 1 diabetes mellitus ([place])     Past Surgical History:  Procedure Laterality Date  . ABDOMINAL ANGIOGRAM N/A 05/14/2012   Procedure: ABDOMINAL ANGIOGRAM;  Surgeon: Laverda Page, MD;  Location: [facility] CATH LAB;  Service: Cardiovascular;  Laterality: N/A;  . ABDOMINAL HYSTERECTOMY  1995   partial  . ANKLE FRACTURE SURGERY Right   . CARDIAC CATHETERIZATION     with coronary angiogram  . COLONOSCOPY N/A 09/09/2014   Procedure: COLONOSCOPY;  Surgeon: Gatha Mayer, MD;  Location: [facility];  Service: Endoscopy;  Laterality: N/A;  . LEFT AND RIGHT HEART CATHETERIZATION WITH CORONARY ANGIOGRAM N/A 05/14/2012   Procedure: LEFT AND RIGHT HEART CATHETERIZATION WITH CORONARY ANGIOGRAM;  Surgeon: Laverda Page, MD;  Location: [facility] CATH LAB;  Service: Cardiovascular;  Laterality: N/A;  . MINI METER   09/08/2014   ELECTRONIC INSULIN PUMP  . THYROIDECTOMY  2006    There were no vitals filed for this visit.       Subjective Assessment - 06/15/17 1458    Subjective The patient notes having difficulty finding her granddaughter in a busy environment when turning to the right.  She also noticed some difficulty with head fullness while trying to back up in scooter (getting for her son in a grocery store).  She reports intermittent nausea with right head turns.   The patient has started sleeping in her recliner to avoid getting dizzy in the bed.     Pertinent History Cateract surgery February 2018.    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   Currently in Pain? No/denies            [facility] PT Assessment - 06/15/17 1502      Assessment   Medical Diagnosis vertigo   Referring Provider Adam Phenix, MD   Onset Date/Surgical Date --  2 weeks ago   Prior Therapy none     Precautions   Precautions None   Precaution Comments "I feel whoozy" and a little nauseous     Restrictions   Weight Bearing Restrictions No     Balance Screen   Has the patient fallen in the past 6 months No   How  many times? one time > 1 year ago.   Has the patient had a decrease in activity level because of a fear of falling?  No   Is the patient reluctant to leave their home because of a fear of falling?  No     Home Environment   Living Environment Private residence   Living Arrangements Children  and grandchildren, and her sister   Type of [place] to enter   Home Layout Two level;Able to live on main level with bedroom/bathroom  gets short of breath ging upstairs     Prior Function   Level of Independence Independent     Observation/Other Assessments   Focus on Therapeutic Outcomes (FOTO)  55%   Other Surveys  Other Surveys   Dizziness Handicap Inventory [facility])  12%            Vestibular  Assessment - 06/15/17 1506      Vestibular Assessment   General Observation The patient ambulates independently into clinic today.     Symptom Behavior   Type of Dizziness --  "whoozy" feeling   Frequency of Dizziness intermittently   Duration of Dizziness seconds to minutes   Aggravating Factors Turning head quickly  to the right   Relieving Factors Head stationary     Occulomotor Exam   Occulomotor Alignment Normal   Spontaneous Absent   Gaze-induced Absent   Smooth Pursuits Intact   Saccades Intact     Vestibulo-Occular Reflex   VOR 1 Head Only (x 1 viewing) slow VOR provokes mild sensation of dizziness   Comment head impulse test=mild corrective saccade noted bilaterally indicating diminished VOR     Positional Testing   Dix-Hallpike Dix-Hallpike Right;Dix-Hallpike Left   Sidelying Test Sidelying Right;Sidelying Left   Horizontal Canal Testing Horizontal Canal Right;Horizontal Canal Left     Dix-Hallpike Right   Dix-Hallpike Right Duration whoozy, nauseous feeling x seconds   Dix-Hallpike Right Symptoms No nystagmus  viewed in room light     Dix-Hallpike Left   Dix-Hallpike Left Duration none   Dix-Hallpike Left Symptoms No nystagmus     Sidelying Right   Sidelying Right Duration Nothing sit>>sidelying, however mild sensation of "unsettled" with return to sitting.   Sidelying Right Symptoms No nystagmus     Sidelying Left   Sidelying Left Duration none   Sidelying Left Symptoms No nystagmus     Horizontal Canal Right   Horizontal Canal Right Duration none   Horizontal Canal Right Symptoms Normal     Horizontal Canal Left   Horizontal Canal Left Duration none   Horizontal Canal Left Symptoms Normal        Objective measurements completed on examination: See above findings.           Vestibular Treatment/Exercise - 06/15/17 1515      Vestibular Treatment/Exercise   Vestibular Treatment Provided Habituation;Canalith Repositioning   Canalith  Repositioning Epley Manuever Right   Habituation Exercises Nestor Lewandowsky   Gaze Exercises X1 Viewing Horizontal      EPLEY MANUEVER RIGHT   Number of Reps  1   Response Details  treated based on symptoms     Nestor Lewandowsky   Number of Reps  2     X1 Viewing Horizontal   Foot Position seated   Comments 15 reps with queasiness provokes and general "lightheaded" sensation               PT Education - 06/15/17 1542  Education provided Yes   Education Details HEP: gaze x 1 viewing, habituation.     Person(s) Educated Patient   Methods Explanation;Demonstration;Handout   Comprehension Verbalized understanding;Returned demonstration             PT Long Term Goals - 06/15/17 1543      PT LONG TERM GOAL #1   Title The patient will have HEP for habituation and gaze x 1 viewing.    TARGET DATE ON ALL STGS:  07/15/2017   Time 4   Period Weeks     PT LONG TERM GOAL #2   Title The patient will tolerate sit>R sidelying without c/o "lightheadedness" or nausea.   Time 4   Period Weeks     PT LONG TERM GOAL #3   Title The patient will tolerate gaze x 1 adaptation x 30 seconds without c/o nausea or lightheadedness.   Time 4   Period Weeks     PT LONG TERM GOAL #4   Title Improve DHI from 12% to 0%.   Time 4   Period Weeks                Plan - 06/15/17 1544    Clinical Impression Statement The patient is a 66 year old female presenting to OP rehab with mild vertigo per dizziness handicap index.  She did not have nystagmus viewed in room light today with positional testing, however does have some "whooziness" and sense of nausea with R dix hallpike.  PT treated based on symptoms today.  The patient also has dec'd gaze per positive head impulse test and was provided adaptation x 1 viewing exercises for home.   History and Personal Factors relevant to plan of care: patient able to drive, and is primary caregiver for son and granddaughter   Clinical Presentation  Stable   Clinical Decision Making Low   Rehab Potential Good   PT Frequency 1x / week   PT Duration 4 weeks   PT Treatment/Interventions ADLs/Self Care Home Management;Neuromuscular re-education;Patient/family education;Vestibular;Canalith Repostioning   PT Next Visit Plan Check BPPV and HEP, treat as indicated   Consulted and Agree with Plan of Care Patient      Patient will benefit from skilled therapeutic intervention in order to improve the following deficits and impairments:  Dizziness  Visit Diagnosis: BPPV (benign paroxysmal positional vertigo), right - Plan: PT plan of care cert/re-cert  Dizziness and giddiness - Plan: PT plan of care cert/re-cert      G-Codes - 25/42/70 1552    Functional Assessment Tool Used (Outpatient Only) DHI=12%   Functional Limitation Self care   Self Care Current Status (W2376) At least 1 percent but less than 20 percent impaired, limited or restricted   Self Care Goal Status (E8315) At least 1 percent but less than 20 percent impaired, limited or restricted       Problem List Patient Active Problem List   Diagnosis Date Noted  . BPPV (benign paroxysmal positional vertigo), right 06/13/2017  . Premature atrial beats 06/13/2017  . Snoring 07/31/2016  . Pre-ulcerative corn or callous 12/15/2015  . History of CHF (congestive heart failure) 10/29/2015  . Essential hypertension 10/29/2015  . 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 Toe injury 09/24/2015  . Thyroid activity decreased   . Stroke with cerebral ischemia ([place])   . HLD (hyperlipidemia)   . 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 CVA (cerebral vascular accident) ([place])  08/29/2015  . Preventative health care 09/30/2014  . Special screening for malignant neoplasms, colon 09/10/2014  . Great toe pain 07/09/2014  . Posterior tibial tendinitis of right leg 04/26/2014  . Complicated UTI (urinary tract infection)  12/24/2013  . Preventive measure 08/20/2013  . Morbid obesity ([place]) 05/29/2013  . Back pain without radiation 04/28/2013  . Nail dystrophy 01/10/2013  . Type 1 diabetes mellitus ([place]) 01/25/2012  . Hyperlipidemia 01/25/2012  . Hypothyroidism 01/25/2012  . Hypertension 01/25/2012  . GERD (gastroesophageal reflux disease) 01/25/2012    Alaric Gladwin, PT 06/15/2017, 3:54 PM  [facility] [address] [place], [place], [address] Phone: [phone]   Fax:  [phone]  Name: ERANDI LEMMA MRN: [id number] Date of Birth: 07-10-1951

## 2017-06-15 NOTE — Patient Instructions (Signed)
Gaze Stabilization: Sitting    Keeping eyes on a target held in hand or placed 3 feet away, and move head side to side for  10-15 times.  Work up to tolerating 30 seconds. Repeat while moving head up and down. Do __2-3__ sessions per day.  Copyright  VHI. All rights reserved.   Gaze Stabilization: Tip Card  1.Target must remain in focus, not blurry, and appear stationary while head is in motion. 2.Perform exercises with small head movements (45 to either side of midline). 3.Increase speed of head motion so long as target is in focus. 4.If you wear eyeglasses, be sure you can see target through lens (therapist will give specific instructions for bifocal / progressive lenses). 5.These exercises may provoke dizziness or nausea. Work through these symptoms. If too dizzy, slow head movement slightly. Rest between each exercise. 6.Exercises demand concentration; avoid distractions.  Copyright  VHI. All rights reserved.   Habituation - Tip Card  1.The goal of habituation training is to assist in decreasing symptoms of vertigo, dizziness, or nausea provoked by specific head and body motions. 2.These exercises may initially increase symptoms; however, be persistent and work through symptoms. With repetition and time, the exercises will assist in reducing or eliminating symptoms. 3.Exercises should be stopped and discussed with the therapist if you experience any of the following: - Sudden change or fluctuation in hearing - New onset of ringing in the ears, or increase in current intensity - Any fluid discharge from the ear - Severe pain in neck or back - Extreme nausea  Copyright  VHI. All rights reserved.   Habituation - Sit to Side-Lying   Sit on edge of bed. Lie down onto the right side and hold until dizziness stops, plus 20 seconds.  Return to sitting and wait until dizziness stops, plus 20 seconds.  Repeat to the left side. Repeat sequence 5 times per session. Do 2 sessions per  day.  Copyright  VHI. All rights reserved.

## 2017-06-19 ENCOUNTER — Encounter: Payer: Self-pay | Admitting: Family Medicine

## 2017-06-19 ENCOUNTER — Other Ambulatory Visit: Payer: Self-pay | Admitting: Family Medicine

## 2017-06-19 ENCOUNTER — Ambulatory Visit (INDEPENDENT_AMBULATORY_CARE_PROVIDER_SITE_OTHER): Payer: Medicare Other | Admitting: Family Medicine

## 2017-06-19 VITALS — BP 136/68 | HR 64 | Temp 98.6°F | Ht 67.0 in | Wt 261.6 lb

## 2017-06-19 DIAGNOSIS — I10 Essential (primary) hypertension: Secondary | ICD-10-CM

## 2017-06-19 MED ORDER — LOSARTAN POTASSIUM-HCTZ 100-12.5 MG PO TABS: 1.0000 | ORAL_TABLET | Freq: Every day | ORAL | 1 refills | Status: DC

## 2017-06-19 NOTE — Progress Notes (Signed)
    Subjective: CC:htn HPI: [name] is a 66 y.o. female presenting to clinic today for:  1. Hypertension Patient reports that she is feeling better since BP started getting better.  She reports feeling "calmer".   ROS: Denies headache, dizziness, visual changes, nausea, vomiting, chest pain, abdominal pain or shortness of breath.  Social Hx reviewed: non smoker. MedHx, medications and allergies reviewed.  Please see EMR. ROS: Per HPI  Objective: Office vital signs reviewed. BP 136/68   Pulse 64   Temp 98.6 F (37 C) (Oral)   Ht 5\' 7"  (1.702 m)   Wt 261 lb 9.6 oz (118.7 kg)   SpO2 98%   BMI 40.97 kg/m   Physical Examination:  General: Awake, alert, well nourished, No acute distress HEENT: Normal, MMM Cardio: regular rate and rhythm, S1S2 heard, no murmurs appreciated Pulm: clear to auscultation bilaterally, no wheezes, rhonchi or rales; normal work of breathing on room air Ext: WWP, no edema  Assessment/ Plan: 66 y.o. female   Hypertension BP under much better control.  Will repeat BMP today.  Continue current regimen.  Follow up in 6 months or sooner if needed.   Janora Norlander, DO PGY-3, [facility]

## 2017-06-19 NOTE — Assessment & Plan Note (Signed)
BP under much better control.  Will repeat BMP today.  Continue current regimen.  Follow up in 6 months or sooner if needed.

## 2017-06-19 NOTE — Patient Instructions (Signed)
Your blood pressure looks great.  I will contact you will the results of your labs.  If anything is abnormal, I will call you.  Otherwise, expect a copy to be mailed to you.

## 2017-06-20 ENCOUNTER — Encounter: Payer: Self-pay | Admitting: Family Medicine

## 2017-06-20 ENCOUNTER — Telehealth: Payer: Self-pay | Admitting: Family Medicine

## 2017-06-20 LAB — BASIC METABOLIC PANEL
BUN / CREAT RATIO: 16 (ref 12–28)
BUN: 14 mg/dL (ref 8–27)
CO2: 24 mmol/L (ref 20–29)
CREATININE: 0.87 mg/dL (ref 0.57–1.00)
Calcium: 9.2 mg/dL (ref 8.7–10.3)
Chloride: 103 mmol/L (ref 96–106)
GFR calc Af Amer: 80 mL/min/{1.73_m2} (ref >59)
GFR, EST NON AFRICAN AMERICAN: 70 mL/min/{1.73_m2} (ref >59)
Glucose: 221 mg/dL — ABNORMAL HIGH (ref 65–99)
POTASSIUM: 4.2 mmol/L (ref 3.5–5.2)
Sodium: 142 mmol/L (ref 134–144)

## 2017-06-20 NOTE — Telephone Encounter (Signed)
Pt's schedule is too busy to schedule appt. Request she call when she finds the time to have AWV. - Mesha Guinyard

## 2017-06-25 ENCOUNTER — Telehealth: Payer: Self-pay | Admitting: Family Medicine

## 2017-06-25 ENCOUNTER — Encounter: Payer: Self-pay | Admitting: Family Medicine

## 2017-06-25 ENCOUNTER — Other Ambulatory Visit: Payer: Self-pay | Admitting: Family Medicine

## 2017-06-25 DIAGNOSIS — I10 Essential (primary) hypertension: Secondary | ICD-10-CM

## 2017-06-25 MED ORDER — LOSARTAN POTASSIUM-HCTZ 100-12.5 MG PO TABS: 1.0000 | ORAL_TABLET | Freq: Every day | ORAL | 1 refills | Status: DC

## 2017-06-25 NOTE — Telephone Encounter (Signed)
Spoke with patient and let her know that script is at the pharmacy.  She isn't due for a refill until 07-13-17 and it will be placed on file.  Verified her pharmacy and took the others off her list. Johnney Ou

## 2017-06-26 MED ORDER — LOSARTAN POTASSIUM-HCTZ 100-12.5 MG PO TABS: 1.0000 | ORAL_TABLET | Freq: Every day | ORAL | 1 refills | Status: DC

## 2017-06-26 NOTE — Addendum Note (Signed)
Addended by: Derl Barrow on: 06/26/2017 07:51 AM   Modules accepted: Orders

## 2017-07-05 ENCOUNTER — Ambulatory Visit: Payer: Medicare Other | Attending: Addiction Medicine | Admitting: Rehabilitative and Restorative Service Providers"

## 2017-07-31 ENCOUNTER — Encounter: Payer: Self-pay | Admitting: Neurology

## 2017-07-31 ENCOUNTER — Ambulatory Visit (INDEPENDENT_AMBULATORY_CARE_PROVIDER_SITE_OTHER): Payer: Medicare Other | Admitting: Neurology

## 2017-07-31 VITALS — BP 143/83 | HR 70 | Wt 267.5 lb

## 2017-07-31 DIAGNOSIS — I6302 Cerebral infarction due to thrombosis of basilar artery: Secondary | ICD-10-CM | POA: Diagnosis not present

## 2017-07-31 DIAGNOSIS — E1059 Type 1 diabetes mellitus with other circulatory complications: Secondary | ICD-10-CM

## 2017-07-31 DIAGNOSIS — I10 Essential (primary) hypertension: Secondary | ICD-10-CM | POA: Diagnosis not present

## 2017-07-31 DIAGNOSIS — I679 Cerebrovascular disease, unspecified: Secondary | ICD-10-CM

## 2017-07-31 NOTE — Patient Instructions (Addendum)
-   continue plavix and lipitor for stroke prevention - Follow up with your primary care physician for stroke risk factor modification. Recommend maintain blood pressure goal 120-140/80, diabetes with hemoglobin A1c goal below 6.5% and lipids with LDL cholesterol goal below 70 mg/dL.  - check BP and glucose at home and record - healthy diet and regular exercises - follow up with endocrinology closely and get DM better controlled.  - follow up as needed.

## 2017-07-31 NOTE — Progress Notes (Signed)
STROKE NEUROLOGY FOLLOW UP NOTE  NAME: JAEDA BRUSO DOB: May 09, 1951  REASON FOR VISIT: stroke follow up HISTORY FROM: pt and chart  Today we had the pleasure of seeing Rosetta JI [facility] in follow-up at our Neurology Clinic. Pt was accompanied by no one.   History Summary Ms. Rosetta Jerilynn Mages Rosensteel is a 66 y.o. female with history of HTN, DM type 2, hyperlipidemia, chronic congestive heart failure, thyroid cancer s/p total thyroidectomy, depression, and GERD was admitted on 08/29/15 for left arm numbness, left leg weakness, trouble walking. 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 CTA head and neck showed b/l cavernous ICA stenosis, and bilateral VA stenosis. TTE showed EF 55-60%. LDL 61 and A1C 9.1. Due to intracranial stenosis, she was put on dual antiplatelet and continued on statin. Her imdur and metoprolol were discontinued during admission. She was discharged with close outpt PCP follow up.   10/29/15 follow up - the patient has been doing well. No recurrent stroke like symptoms. She has been followed with PCP, continued on dual antiplatelet. She has seen Dr. Einar Gip in the past for CHF, CAD but has not followed with him in 3 years. Her TTE was EF 55-60%. Will hold off lmdur and metoprolol but recommend to follow up with cardiology. She still in insulin pump and stated that her sugar is in good control lately. Bp today 120/75.  02/01/16 follow up - pt has been doing well. No complains. On plavix and lipitor now. Not checking BP at home and today BP 154/74. Stated that glucose in better control. Doing exercise at home. Followed with Dr. Marlou Porch in cardiology and OK with off imdur and metoprolol.   07/31/2016 follow up - pt has been doing well from stroke standpoint. She had right radial head fracture after a fall in 03/2016, has been following with ortho and doing well so far. Her BP today 148/70 and stated at home her BP around 140/70. Glucose also better, less than 200  all the time now. This morning at home 111. Still on insulin pump. She is following with PCP and endocrinology. She admits snoring during sleep but not much daytime sleepiness. Had no sleep study in the past.  Interval History During the interval time, pt has been doing well from stroke standpoint. She refused sleepy study so far due to financial reason. She had cataract surgery during the interval time and not able to pay for sleep study. She will let us know when she is ready for sleep study. Her BP better controlled at home and BP today 143/82. However, her glucose not in good control, recent A1C 9.8. She is on insulin pump but glucose just fluctuate. She is upset, however, currently working hard with her endocrinologist to control it.   REVIEW OF SYSTEMS: Full 14 system review of systems performed and notable only for those listed below and in HPI above, all others are negative:  Constitutional:   Cardiovascular:  Ear/Nose/Throat:   Skin:  Eyes:   Respiratory:   Gastroitestinal:   Genitourinary:  Hematology/Lymphatic:   Endocrine:  Musculoskeletal:   Allergy/Immunology:   Neurological:   Psychiatric:  Sleep:   The following represents the patient's updated allergies and side effects list: No Known Allergies  The neurologically relevant items on the patient's problem list were reviewed on today's visit.  Neurologic Examination  A problem focused neurological exam (12 or more points of the single system neurologic examination, vital signs counts as 1 point, cranial nerves  count for 8 points) was performed.  Blood pressure (!) 143/83, pulse 70, weight 267 lb 8 oz (121.3 kg).  General - morbid obesity, well developed, in no apparent distress.  Ophthalmologic - Fundi not visualized due to small pupils.  Cardiovascular - Regular rate and rhythm.  Mental Status -  Level of arousal and orientation to time, place, and person were intact. Language including expression, naming,  repetition, comprehension was assessed and found intact. Fund of Knowledge was assessed and was intact.  Cranial Nerves II - XII - II - Visual field intact OU. III, IV, VI - Extraocular movements intact. V - Facial sensation intact bilaterally. VII - Facial movement intact bilaterally. VIII - Hearing & vestibular intact bilaterally. X - Palate elevates symmetrically. XI - Chin turning & shoulder shrug intact bilaterally. XII - Tongue protrusion intact.  Motor Strength - The patient's strength was normal in all extremities and pronator drift was absent.  Bulk was normal and fasciculations were absent.   Motor Tone - Muscle tone was assessed at the neck and appendages and was normal.  Reflexes - The patient's reflexes were 1+ in all extremities and she had no pathological reflexes.  Sensory - Light touch, temperature/pinprick were assessed and were normal.    Coordination - The patient had normal movements in the hands and feet with no ataxia or dysmetria.  Tremor was absent.  Gait and Station - broad based gait.  Data reviewed: I personally reviewed the images and agree with the radiology interpretations.  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 Anterior and middle cerebral arteries widely patent bilaterally Moderate to severe stenosis distal vertebral artery bilaterally due to calcific stenosis.   Mr Brain Wo Contrast 08/29/2015 Small acute infarct in the dorsal brainstem region of medial lemniscus. Chronic infarct left pons Electronically Signed By: Franchot Gallo M.D. On: 08/29/2015 10:26   CTA neck - 1. Minimal atherosclerosis in the neck. No carotid or vertebral artery stenosis in the neck. 2. Stable visualized brain parenchyma, subcentimeter right dorsal brainstem infarct remains occult on CT.  2D echo - - Left ventricle: The cavity size was normal. Systolic function  was normal. The estimated ejection fraction was in the range of 55% to 60%. Wall motion was normal; there were no regional wall motion abnormalities. - Mitral valve: Calcified annulus. There was mild regurgitation. - Left atrium: The atrium was moderately dilated. - Pulmonary arteries: PA peak pressure: 50 mm Hg (S).  Component     Latest Ref Rng 08/29/2015 08/30/2015  Cholesterol     0 - 200 mg/dL  131  Triglycerides     <150 mg/dL  70  HDL Cholesterol     >40 mg/dL  56  Total CHOL/HDL Ratio       2.3  VLDL     0 - 40 mg/dL  14  LDL (calc)     0 - 99 mg/dL  61  Hemoglobin A1C     4.8 - 5.6 % 9.1 (H)   Mean Plasma Glucose      214    Component     Latest Ref Rng & Units 08/08/2016 06/13/2017  Hemoglobin A1C      9.0 9.8  TSH     mIU/L 8.49 (H)     Assessment: As you may recall, she is a 66 y.o. 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 CTA head and neck showed b/l cavernous ICA stenosis, and bilateral VA stenosis. TTE showed EF 55-60%. LDL 61 and A1C 9.1. Due to intracranial stenosis, she was put on dual antiplatelet and continued on statin. Her imdur and metoprolol were discontinued during admission. During the interval time, the patient has been doing well. Followed with cardiology and off lmdur and metoprolol. Finished off DAPT, on plavix now. BP better controlled and but glucose still high, currently following with PCP and endocrinology. Still on insulin pump. Pt currently declined sleep study.   Plan:  - continue plavix and lipitor for stroke prevention - Follow up with your primary care physician for stroke risk factor modification. Recommend maintain blood pressure goal 120-140/80, diabetes with hemoglobin A1c goal below 6.5% and lipids with LDL cholesterol goal below 70 mg/dL.  - check BP and  glucose at home and record - healthy diet and regular exercises - follow up with endocrinology closely and get DM better controlled.  - follow up as needed.   I spent more than 25 minutes of face to face time with the patient. Greater than 50% of time was spent in counseling and coordination of care. We have discussed about BP check at home, better glucose control, following with endocrinologist closely.  No orders of the defined types were placed in this encounter.   No orders of the defined types were placed in this encounter.   Patient Instructions  - continue plavix and lipitor for stroke prevention - Follow up with your primary care physician for stroke risk factor modification. Recommend maintain blood pressure goal 120-140/80, diabetes with hemoglobin A1c goal below 6.5% and lipids with LDL cholesterol goal below 70 mg/dL.  - check BP and glucose at home and record - healthy diet and regular exercises - follow up with endocrinology closely and get DM better controlled.  - follow up as needed.    Rosalin Hawking, MD PhD [facility] [address], [place], [place] [address] [phone]

## 2017-08-13 ENCOUNTER — Encounter: Payer: Self-pay | Admitting: Rehabilitative and Restorative Service Providers"

## 2017-08-13 NOTE — Therapy (Signed)
[facility] [address] [place], [place], [address] Phone: [phone]   Fax:  [phone]  Patient Details  Name: [name] MRN: [id number] Date of Birth: 1951-01-25  Encounter Date: last encounter 06/15/2017  PHYSICAL THERAPY DISCHARGE SUMMARY  Visits from Start of Care: eval only--see note  Current functional level related to goals / functional outcomes: The patient did not return to PT s/p evaluation.  See initial summary for patient deficits.   Plan: Patient agrees to discharge.  Patient goals were not met. Patient is being discharged due to not returning since the last visit.  ?????     Thank you for the referral of this patient. Rudell Cobb, MPT   Dulse Rutan 08/13/2017, 8:29 AM  [facility] [address] [place], [place], [address] Phone: [phone]   Fax:  [phone]

## 2017-08-30 ENCOUNTER — Other Ambulatory Visit: Payer: Self-pay | Admitting: Family Medicine

## 2017-08-30 DIAGNOSIS — Z1231 Encounter for screening mammogram for malignant neoplasm of breast: Secondary | ICD-10-CM

## 2017-09-11 ENCOUNTER — Encounter: Payer: Self-pay | Admitting: *Deleted

## 2017-09-11 DIAGNOSIS — Z006 Encounter for examination for normal comparison and control in clinical research program: Secondary | ICD-10-CM

## 2017-09-11 NOTE — Progress Notes (Signed)
STROKE-AF Research study month 24 follow up visit completed. Patient had well visit 06/13/17 and hypertension worsened. Hyzaar was Increased to 100/12.5 mg daily. Issue resolved on 06/19/17 at follow up visit.  Nest research required visit window is 10/FEB/2019-07/APR/2019. I thanked patient for participation in research study.

## 2017-10-02 ENCOUNTER — Ambulatory Visit
Admission: RE | Admit: 2017-10-02 | Discharge: 2017-10-02 | Disposition: A | Discharge status: Home / Self Care | Payer: Medicare Other | Source: Ambulatory Visit | Attending: Family Medicine | Admitting: Family Medicine

## 2017-10-02 DIAGNOSIS — Z1231 Encounter for screening mammogram for malignant neoplasm of breast: Secondary | ICD-10-CM

## 2017-10-14 ENCOUNTER — Observation Stay (HOSPITAL_COMMUNITY)
Admission: EM | Admit: 2017-10-14 | Discharge: 2017-10-16 | Disposition: A | Discharge status: Home / Self Care | Payer: Medicare Other | Attending: Family Medicine | Admitting: Family Medicine

## 2017-10-14 ENCOUNTER — Encounter (HOSPITAL_COMMUNITY): Payer: Self-pay

## 2017-10-14 DIAGNOSIS — E876 Hypokalemia: Secondary | ICD-10-CM | POA: Diagnosis not present

## 2017-10-14 DIAGNOSIS — R197 Diarrhea, unspecified: Secondary | ICD-10-CM | POA: Diagnosis present

## 2017-10-14 DIAGNOSIS — E109 Type 1 diabetes mellitus without complications: Secondary | ICD-10-CM | POA: Diagnosis not present

## 2017-10-14 DIAGNOSIS — E890 Postprocedural hypothyroidism: Secondary | ICD-10-CM | POA: Diagnosis not present

## 2017-10-14 DIAGNOSIS — Z6841 Body Mass Index (BMI) 40.0 and over, adult: Secondary | ICD-10-CM | POA: Diagnosis not present

## 2017-10-14 DIAGNOSIS — Z90710 Acquired absence of both cervix and uterus: Secondary | ICD-10-CM | POA: Insufficient documentation

## 2017-10-14 DIAGNOSIS — Z8042 Family history of malignant neoplasm of prostate: Secondary | ICD-10-CM | POA: Insufficient documentation

## 2017-10-14 DIAGNOSIS — Z794 Long term (current) use of insulin: Secondary | ICD-10-CM | POA: Insufficient documentation

## 2017-10-14 DIAGNOSIS — I491 Atrial premature depolarization: Secondary | ICD-10-CM | POA: Insufficient documentation

## 2017-10-14 DIAGNOSIS — E860 Dehydration: Secondary | ICD-10-CM

## 2017-10-14 DIAGNOSIS — H8110 Benign paroxysmal vertigo, unspecified ear: Secondary | ICD-10-CM | POA: Diagnosis not present

## 2017-10-14 DIAGNOSIS — Z7902 Long term (current) use of antithrombotics/antiplatelets: Secondary | ICD-10-CM | POA: Diagnosis not present

## 2017-10-14 DIAGNOSIS — M1990 Unspecified osteoarthritis, unspecified site: Secondary | ICD-10-CM | POA: Insufficient documentation

## 2017-10-14 DIAGNOSIS — E785 Hyperlipidemia, unspecified: Secondary | ICD-10-CM | POA: Insufficient documentation

## 2017-10-14 DIAGNOSIS — E1065 Type 1 diabetes mellitus with hyperglycemia: Secondary | ICD-10-CM

## 2017-10-14 DIAGNOSIS — K219 Gastro-esophageal reflux disease without esophagitis: Secondary | ICD-10-CM | POA: Insufficient documentation

## 2017-10-14 DIAGNOSIS — I509 Heart failure, unspecified: Secondary | ICD-10-CM | POA: Diagnosis not present

## 2017-10-14 DIAGNOSIS — E871 Hypo-osmolality and hyponatremia: Secondary | ICD-10-CM | POA: Insufficient documentation

## 2017-10-14 DIAGNOSIS — R9431 Abnormal electrocardiogram [ECG] [EKG]: Secondary | ICD-10-CM | POA: Insufficient documentation

## 2017-10-14 DIAGNOSIS — Z823 Family history of stroke: Secondary | ICD-10-CM | POA: Insufficient documentation

## 2017-10-14 DIAGNOSIS — Z79899 Other long term (current) drug therapy: Secondary | ICD-10-CM | POA: Insufficient documentation

## 2017-10-14 DIAGNOSIS — Z9641 Presence of insulin pump (external) (internal): Secondary | ICD-10-CM | POA: Insufficient documentation

## 2017-10-14 DIAGNOSIS — Z833 Family history of diabetes mellitus: Secondary | ICD-10-CM | POA: Insufficient documentation

## 2017-10-14 DIAGNOSIS — Z8249 Family history of ischemic heart disease and other diseases of the circulatory system: Secondary | ICD-10-CM | POA: Insufficient documentation

## 2017-10-14 DIAGNOSIS — Z85850 Personal history of malignant neoplasm of thyroid: Secondary | ICD-10-CM | POA: Diagnosis not present

## 2017-10-14 DIAGNOSIS — F329 Major depressive disorder, single episode, unspecified: Secondary | ICD-10-CM | POA: Diagnosis not present

## 2017-10-14 DIAGNOSIS — IMO0002 Reserved for concepts with insufficient information to code with codable children: Secondary | ICD-10-CM

## 2017-10-14 DIAGNOSIS — Z8673 Personal history of transient ischemic attack (TIA), and cerebral infarction without residual deficits: Secondary | ICD-10-CM | POA: Insufficient documentation

## 2017-10-14 DIAGNOSIS — I110 Hypertensive heart disease with heart failure: Secondary | ICD-10-CM | POA: Diagnosis not present

## 2017-10-14 DIAGNOSIS — L603 Nail dystrophy: Secondary | ICD-10-CM | POA: Diagnosis not present

## 2017-10-14 DIAGNOSIS — Z841 Family history of disorders of kidney and ureter: Secondary | ICD-10-CM | POA: Insufficient documentation

## 2017-10-14 DIAGNOSIS — I493 Ventricular premature depolarization: Secondary | ICD-10-CM | POA: Diagnosis not present

## 2017-10-14 LAB — URINALYSIS, ROUTINE W REFLEX MICROSCOPIC
BACTERIA UA: NONE SEEN
BILIRUBIN URINE: NEGATIVE
Glucose, UA: NEGATIVE mg/dL
HGB URINE DIPSTICK: NEGATIVE
KETONES UR: NEGATIVE mg/dL
LEUKOCYTES UA: NEGATIVE
Nitrite: NEGATIVE
Protein, ur: 30 mg/dL — AB
Specific Gravity, Urine: 1.017 (ref 1.005–1.030)
pH: 6 (ref 5.0–8.0)

## 2017-10-14 LAB — CBC
HCT: 33.3 % — ABNORMAL LOW (ref 36.0–46.0)
HEMOGLOBIN: 9.9 g/dL — AB (ref 12.0–15.0)
MCH: 29 pg (ref 26.0–34.0)
MCHC: 29.7 g/dL — AB (ref 30.0–36.0)
MCV: 97.7 fL (ref 78.0–100.0)
Platelets: 293 10*3/uL (ref 150–400)
RBC: 3.41 MIL/uL — ABNORMAL LOW (ref 3.87–5.11)
RDW: 17.5 % — ABNORMAL HIGH (ref 11.5–15.5)
WBC: 11.3 10*3/uL — ABNORMAL HIGH (ref 4.0–10.5)

## 2017-10-14 LAB — HEPATIC FUNCTION PANEL
ALBUMIN: 3.2 g/dL — AB (ref 3.5–5.0)
ALK PHOS: 73 U/L (ref 38–126)
ALT: 19 U/L (ref 14–54)
AST: 19 U/L (ref 15–41)
Bilirubin, Direct: 0.2 mg/dL (ref 0.1–0.5)
Indirect Bilirubin: 0.7 mg/dL (ref 0.3–0.9)
TOTAL PROTEIN: 6.9 g/dL (ref 6.5–8.1)
Total Bilirubin: 0.9 mg/dL (ref 0.3–1.2)

## 2017-10-14 LAB — BASIC METABOLIC PANEL
ANION GAP: 14 (ref 5–15)
ANION GAP: 8 (ref 5–15)
BUN: 105 mg/dL — ABNORMAL HIGH (ref 6–20)
BUN: 15 mg/dL (ref 6–20)
CHLORIDE: 102 mmol/L (ref 101–111)
CO2: 25 mmol/L (ref 22–32)
CO2: 24 mmol/L (ref 22–32)
Calcium: 8 mg/dL — ABNORMAL LOW (ref 8.9–10.3)
Calcium: 8.6 mg/dL — ABNORMAL LOW (ref 8.9–10.3)
Chloride: 89 mmol/L — ABNORMAL LOW (ref 101–111)
Creatinine, Ser: 17.11 mg/dL — ABNORMAL HIGH (ref 0.44–1.00)
Creatinine, Ser: 0.92 mg/dL (ref 0.44–1.00)
GFR calc Af Amer: 2 mL/min — ABNORMAL LOW (ref >60)
GFR calc non Af Amer: >60 mL/min (ref >60)
GFR, EST NON AFRICAN AMERICAN: 2 mL/min — AB (ref >60)
GLUCOSE: 164 mg/dL — AB (ref 65–99)
Glucose, Bld: 184 mg/dL — ABNORMAL HIGH (ref 65–99)
Potassium: 6.4 mmol/L (ref 3.5–5.1)
Potassium: 3.3 mmol/L — ABNORMAL LOW (ref 3.5–5.1)
Sodium: 128 mmol/L — ABNORMAL LOW (ref 135–145)
Sodium: 134 mmol/L — ABNORMAL LOW (ref 135–145)

## 2017-10-14 LAB — CBG MONITORING, ED: GLUCOSE-CAPILLARY: 191 mg/dL — AB (ref 65–99)

## 2017-10-14 LAB — LIPASE, BLOOD: Lipase: 15 U/L (ref 11–51)

## 2017-10-14 LAB — MAGNESIUM: Magnesium: 1.5 mg/dL — ABNORMAL LOW (ref 1.7–2.4)

## 2017-10-14 MED ORDER — POTASSIUM CHLORIDE IN NACL 20-0.9 MEQ/L-% IV SOLN
Freq: Once | INTRAVENOUS | Status: AC
  Administered 2017-10-15: 01:00:00 via INTRAVENOUS
  Filled 2017-10-14: qty 1000

## 2017-10-14 MED ORDER — SODIUM CHLORIDE 0.9 % IV BOLUS (SEPSIS)
1000.0000 mL | Freq: Once | INTRAVENOUS | Status: AC
  Administered 2017-10-14: 1000 mL via INTRAVENOUS

## 2017-10-14 NOTE — ED Notes (Signed)
Provider notified of critical lab value for potassium

## 2017-10-14 NOTE — ED Notes (Signed)
Called lab and added on addition lab tests

## 2017-10-14 NOTE — ED Notes (Signed)
Pt informed if she needs to have a bowel movement that we needed a sample

## 2017-10-14 NOTE — ED Provider Notes (Signed)
Emergency Department Provider Note   I have reviewed the triage vital signs and the nursing notes.   HISTORY  Chief Complaint Diarrhea   HPI [name] is a 66 y.o. female with PMH of CHF, HLD, HTN, and IDDM resents to the emergency department for evaluation multiple episodes of watery diarrhea that started today. Patient has felt generally fatigued and that has worsened over the past 24 hours. Patient denies any sick contacts or travel. No recent antibiotics. No similar symptoms in the past. She is having some stool incontinence due to profuse diarrhea. No recent hospitalization. She is not seen any blood or black in her bowel movements. She had one episode of vomiting and some nausea but this has not been a persistent symptom for her. Denies any CP or palpitations.      Past Medical History:  Diagnosis Date  . Anemia   . Arthritis   . CHF (congestive heart failure) ([place])   . Depression   . GERD (gastroesophageal reflux disease)   . Hemorrhoids   . Hyperlipidemia   . Hypertension   . Hypothyroidism   . Seasonal allergies   . Stroke ([place])   . Thyroid cancer ([place])    per pt S/p Total Thyroidectomy with Radioactive Iodine Therapy  . Type 1 diabetes mellitus [facility])     Patient Active Problem List   Diagnosis Date Noted  . Diarrhea 10/15/2017  . BPPV (benign paroxysmal positional vertigo), right 06/13/2017  . Premature atrial beats 06/13/2017  . Snoring 07/31/2016  . Pre-ulcerative corn or callous 12/15/2015  . History of CHF (congestive heart failure) 10/29/2015  . Essential hypertension 10/29/2015  . Cerebrovascular accident (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 ([place]) 10/29/2015  . Toe injury 09/24/2015  . Thyroid activity decreased   . Intracranial vascular stenosis   . HLD (hyperlipidemia)   . Cerebral infarction due to thrombosis of basilar artery ([place])   . CVA (cerebral vascular accident) ([place]) 08/29/2015  . Preventative health care 09/30/2014  .  Special screening for malignant neoplasms, colon 09/10/2014  . Great toe pain 07/09/2014  . Posterior tibial tendinitis of right leg 04/26/2014  . Complicated UTI (urinary tract infection) 12/24/2013  . Preventive measure 08/20/2013  . Morbid obesity ([place]) 05/29/2013  . Back pain without radiation 04/28/2013  . Nail dystrophy 01/10/2013  . Diabetes mellitus type I ([place]) 01/25/2012  . Hyperlipidemia 01/25/2012  . Hypothyroidism 01/25/2012  . Hypertension 01/25/2012  . GERD (gastroesophageal reflux disease) 01/25/2012    Past Surgical History:  Procedure Laterality Date  . ABDOMINAL ANGIOGRAM N/A 05/14/2012   Procedure: ABDOMINAL ANGIOGRAM;  Surgeon: Laverda Page, MD;  Location: [facility] CATH LAB;  Service: Cardiovascular;  Laterality: N/A;  . ABDOMINAL HYSTERECTOMY  1995   partial  . ANKLE FRACTURE SURGERY Right   . CARDIAC CATHETERIZATION     with coronary angiogram  . COLONOSCOPY N/A 09/09/2014   Procedure: COLONOSCOPY;  Surgeon: Gatha Mayer, MD;  Location: [facility];  Service: Endoscopy;  Laterality: N/A;  . LEFT AND RIGHT HEART CATHETERIZATION WITH CORONARY ANGIOGRAM N/A 05/14/2012   Procedure: LEFT AND RIGHT HEART CATHETERIZATION WITH CORONARY ANGIOGRAM;  Surgeon: Laverda Page, MD;  Location[facility] CATH LAB;  Service: Cardiovascular;  Laterality: N/A;  . MINI METER   09/08/2014   ELECTRONIC INSULIN PUMP  . THYROIDECTOMY  2006      Allergies Patient has no known allergies.  Family History  Problem Relation Age of Onset  . Heart disease Mother  MI  . Hyperlipidemia Mother   . Hypertension Mother   . Renal Disease Mother        insufficiency  . Early death Father        Head Injury at Work  . Hyperlipidemia Father   . Hypertension Father   . Stroke Son   . Diabetes Maternal Aunt   . Prostate cancer Paternal Uncle   . Heart disease Maternal Aunt   . Breast cancer Neg Hx     Social History Social History  Substance Use Topics  . Smoking status:  Never Smoker  . Smokeless tobacco: Never Used  . Alcohol use No    Review of Systems  Constitutional: No fever/chills. Eyes: No visual changes. ENT: No sore throat. Cardiovascular: Denies chest pain. Respiratory: Denies shortness of breath. Gastrointestinal: No abdominal pain. Positive nausea and vomiting. Profuse watery diarrhea.  No constipation. Genitourinary: Negative for dysuria. Musculoskeletal: Negative for back pain. Skin: Negative for rash. Neurological: Negative for headaches, focal weakness or numbness.  10-point ROS otherwise negative.  ____________________________________________   PHYSICAL EXAM:  VITAL SIGNS: ED Triage Vitals [10/14/17 1958]  Enc Vitals Group     BP 117/67     Pulse Rate 64     Resp 16     Temp 99.4 F (37.4 C)     Temp Source Oral     SpO2 100 %   Constitutional: Alert and oriented. Well appearing and in no acute distress. Eyes: Conjunctivae are normal. Head: Atraumatic. Nose: No congestion/rhinnorhea. Mouth/Throat: Mucous membranes are slightly dry.  Neck: No stridor.   Cardiovascular: Normal rate, regular rhythm. Good peripheral circulation. Grossly normal heart sounds.   Respiratory: Normal respiratory effort.  No retractions. Lungs CTAB. Gastrointestinal: Soft and nontender. No distention.  Musculoskeletal: No lower extremity tenderness nor edema. No gross deformities of extremities. Neurologic:  Normal speech and language. No gross focal neurologic deficits are appreciated.  Skin:  Skin is warm, dry and intact. No rash noted.  ____________________________________________   LABS (all labs ordered are listed, but only abnormal results are displayed)  Labs Reviewed  BASIC METABOLIC PANEL - Abnormal; Notable for the following:       Result Value   Sodium 128 (*)    Potassium 6.4 (*)    Chloride 89 (*)    Glucose, Bld 164 (*)    BUN 105 (*)    Creatinine, Ser 17.11 (*)    Calcium 8.0 (*)    GFR calc non Af Amer 2 (*)     GFR calc Af Amer 2 (*)    All other components within normal limits  CBC - Abnormal; Notable for the following:    WBC 11.3 (*)    RBC 3.41 (*)    Hemoglobin 9.9 (*)    HCT 33.3 (*)    MCHC 29.7 (*)    RDW 17.5 (*)    All other components within normal limits  URINALYSIS, ROUTINE W REFLEX MICROSCOPIC - Abnormal; Notable for the following:    Protein, ur 30 (*)    Squamous Epithelial / LPF 0-5 (*)    All other components within normal limits  HEPATIC FUNCTION PANEL - Abnormal; Notable for the following:    Albumin 3.2 (*)    All other components within normal limits  MAGNESIUM - Abnormal; Notable for the following:    Magnesium 1.5 (*)    All other components within normal limits  BASIC METABOLIC PANEL - Abnormal; Notable for the following:    Sodium  134 (*)    Potassium 3.3 (*)    Glucose, Bld 184 (*)    Calcium 8.6 (*)    All other components within normal limits  BASIC METABOLIC PANEL - Abnormal; Notable for the following:    Glucose, Bld 213 (*)    Calcium 8.2 (*)    All other components within normal limits  GLUCOSE, CAPILLARY - Abnormal; Notable for the following:    Glucose-Capillary 222 (*)    All other components within normal limits  CBC - Abnormal; Notable for the following:    RBC 3.82 (*)    Hemoglobin 10.5 (*)    HCT 33.5 (*)    All other components within normal limits  GLUCOSE, CAPILLARY - Abnormal; Notable for the following:    Glucose-Capillary 220 (*)    All other components within normal limits  CBG MONITORING, ED - Abnormal; Notable for the following:    Glucose-Capillary 191 (*)    All other components within normal limits  C DIFFICILE QUICK SCREEN W PCR REFLEX  URINE CULTURE  GASTROINTESTINAL PANEL BY PCR, STOOL (REPLACES STOOL CULTURE)  LIPASE, BLOOD  TSH   ____________________________________________  EKG   EKG Interpretation  Date/Time:  Sunday October 14 2017 19:56:55 EDT Ventricular Rate:  111 PR Interval:    QRS Duration: 80 QT  Interval:  364 QTC Calculation: 495 R Axis:   -10 Text Interpretation:  Sinus rhythm with frequent PVCs.  Minimal voltage criteria for LVH, may be normal variant Abnormal ECG Confirmed by Nanda Quinton [id number]) on 10/14/2017 9:05:32 PM Also confirmed by Nanda Quinton [phone]), editor Hattie Perch ([id number])  on 10/15/2017 7:07:41 AM       ____________________________________________  RADIOLOGY  None ____________________________________________   PROCEDURES  Procedure(s) performed:   Procedures  None ____________________________________________   INITIAL IMPRESSION / ASSESSMENT AND PLAN / ED COURSE  Pertinent labs & imaging results that were available during my care of the patient were reviewed by me and considered in my medical decision making (see chart for details).  Patient presents emergency department for evaluation of profuse watery diarrhea. She's had approximately 12 episodes of diarrhea today. No fevers or chills. Some tachycardia on exam. Abdomen is soft and completely nontender to palpation. Plan for IV fluids, labs, a C. Difficile sample, and reassess. The patient's rhythm on arrival EKG appears to be sinus with multiple PVCs which give it an irregular pattern. She does have upright P waves in lead 2 and inverted P waves in aVL consistent with sinus rhythm.   Repeat labs show mild hypokalemia. Repeat BMP shows lab error on initial value. Patient with continued diarrhea here in the ED. Plan for overnight observation and IVF. Sending additional stool studies. Replacing potassium and may need magnesium supplementation. Ectopy on monitor likely 2/2 electrolyte imbalance.   Discussed patient's case with Family Medicine team to request admission. Patient and family (if present) updated with plan. Care transferred to [facility] Medicine service.  I reviewed all nursing notes, vitals, pertinent old records, EKGs, labs, imaging (as  available).  ____________________________________________  FINAL CLINICAL IMPRESSION(S) / ED DIAGNOSES  Final diagnoses:  Diarrhea of presumed infectious origin  Dehydration  Frequent PVCs  Hypokalemia  Hypomagnesemia     MEDICATIONS GIVEN DURING THIS VISIT:  Medications  levothyroxine (SYNTHROID, LEVOTHROID) tablet 175 mcg (175 mcg Oral Given 10/15/17 0557)  clopidogrel (PLAVIX) tablet 75 mg (not administered)  atorvastatin (LIPITOR) tablet 40 mg (not administered)  insulin pump 10 each (not administered)  famotidine (PEPCID) tablet 20 mg (  20 mg Oral Given 10/15/17 0320)  acetaminophen (TYLENOL) tablet 650 mg (not administered)    Or  acetaminophen (TYLENOL) suppository 650 mg (not administered)  insulin aspart (novoLOG) injection 0-9 Units (0 Units Subcutaneous Not Given 10/15/17 0800)  insulin pump (not administered)  sodium chloride 0.9 % bolus 1,000 mL (0 mLs Intravenous Stopped 10/14/17 2216)  0.9 % NaCl with KCl 20 mEq/ L  infusion ( Intravenous New Bag/Given 10/15/17 0030)     NEW OUTPATIENT MEDICATIONS STARTED DURING THIS VISIT:  None  Note:  This document was prepared using Dragon voice recognition software and may include unintentional dictation errors.  Nanda Quinton, MD Emergency Medicine    Deetra Booton, [facility], MD 10/15/17 [phone]

## 2017-10-14 NOTE — ED Triage Notes (Addendum)
Onset this morning diarrhea x 12, watery.  Stools started out green and now black- pt took Pepto this morning.  Vomited x 1.  No one in household with same symptoms.  Pt reports recent flu shot.  Pt reports she just "feels weak today".

## 2017-10-14 NOTE — ED Notes (Signed)
Pt given diet gingerale for fluid challenge.   

## 2017-10-15 DIAGNOSIS — E1065 Type 1 diabetes mellitus with hyperglycemia: Secondary | ICD-10-CM

## 2017-10-15 DIAGNOSIS — E876 Hypokalemia: Secondary | ICD-10-CM

## 2017-10-15 DIAGNOSIS — E860 Dehydration: Secondary | ICD-10-CM | POA: Diagnosis not present

## 2017-10-15 DIAGNOSIS — I493 Ventricular premature depolarization: Secondary | ICD-10-CM | POA: Diagnosis not present

## 2017-10-15 DIAGNOSIS — E109 Type 1 diabetes mellitus without complications: Secondary | ICD-10-CM | POA: Diagnosis not present

## 2017-10-15 DIAGNOSIS — I491 Atrial premature depolarization: Secondary | ICD-10-CM

## 2017-10-15 DIAGNOSIS — IMO0002 Reserved for concepts with insufficient information to code with codable children: Secondary | ICD-10-CM

## 2017-10-15 DIAGNOSIS — R197 Diarrhea, unspecified: Secondary | ICD-10-CM | POA: Diagnosis not present

## 2017-10-15 DIAGNOSIS — E871 Hypo-osmolality and hyponatremia: Secondary | ICD-10-CM | POA: Diagnosis not present

## 2017-10-15 LAB — BASIC METABOLIC PANEL
Anion gap: 10 (ref 5–15)
BUN: 11 mg/dL (ref 6–20)
CHLORIDE: 105 mmol/L (ref 101–111)
CO2: 23 mmol/L (ref 22–32)
CREATININE: 0.86 mg/dL (ref 0.44–1.00)
Calcium: 8.2 mg/dL — ABNORMAL LOW (ref 8.9–10.3)
GFR calc Af Amer: >60 mL/min (ref >60)
GFR calc non Af Amer: >60 mL/min (ref >60)
GLUCOSE: 213 mg/dL — AB (ref 65–99)
POTASSIUM: 3.5 mmol/L (ref 3.5–5.1)
Sodium: 138 mmol/L (ref 135–145)

## 2017-10-15 LAB — TSH: TSH: 1.028 u[IU]/mL (ref 0.350–4.500)

## 2017-10-15 LAB — GASTROINTESTINAL PANEL BY PCR, STOOL (REPLACES STOOL CULTURE)
ADENOVIRUS F40/41: NOT DETECTED
ASTROVIRUS: NOT DETECTED
CAMPYLOBACTER SPECIES: NOT DETECTED
CYCLOSPORA CAYETANENSIS: NOT DETECTED
Cryptosporidium: NOT DETECTED
ENTEROAGGREGATIVE E COLI (EAEC): NOT DETECTED
ENTEROPATHOGENIC E COLI (EPEC): NOT DETECTED
ENTEROTOXIGENIC E COLI (ETEC): NOT DETECTED
Entamoeba histolytica: NOT DETECTED
GIARDIA LAMBLIA: NOT DETECTED
Norovirus GI/GII: NOT DETECTED
PLESIMONAS SHIGELLOIDES: NOT DETECTED
Rotavirus A: NOT DETECTED
Salmonella species: NOT DETECTED
Sapovirus (I, II, IV, and V): NOT DETECTED
Shiga like toxin producing E coli (STEC): NOT DETECTED
Shigella/Enteroinvasive E coli (EIEC): NOT DETECTED
VIBRIO SPECIES: NOT DETECTED
Vibrio cholerae: NOT DETECTED
YERSINIA ENTEROCOLITICA: NOT DETECTED

## 2017-10-15 LAB — CBC
HEMATOCRIT: 33.5 % — AB (ref 36.0–46.0)
HEMOGLOBIN: 10.5 g/dL — AB (ref 12.0–15.0)
MCH: 27.5 pg (ref 26.0–34.0)
MCHC: 31.3 g/dL (ref 30.0–36.0)
MCV: 87.7 fL (ref 78.0–100.0)
Platelets: 206 10*3/uL (ref 150–400)
RBC: 3.82 MIL/uL — AB (ref 3.87–5.11)
RDW: 13 % (ref 11.5–15.5)
WBC: 6.3 10*3/uL (ref 4.0–10.5)

## 2017-10-15 LAB — GLUCOSE, CAPILLARY
GLUCOSE-CAPILLARY: 220 mg/dL — AB (ref 65–99)
GLUCOSE-CAPILLARY: 211 mg/dL — AB (ref 65–99)
GLUCOSE-CAPILLARY: 241 mg/dL — AB (ref 65–99)
Glucose-Capillary: 222 mg/dL — ABNORMAL HIGH (ref 65–99)
Glucose-Capillary: 212 mg/dL — ABNORMAL HIGH (ref 65–99)

## 2017-10-15 LAB — C DIFFICILE QUICK SCREEN W PCR REFLEX
C DIFFICILE (CDIFF) INTERP: NOT DETECTED
C Diff antigen: NEGATIVE
C Diff toxin: NEGATIVE

## 2017-10-15 MED ORDER — FAMOTIDINE 20 MG PO TABS
20.0000 mg | ORAL_TABLET | Freq: Two times a day (BID) | ORAL | Status: DC
  Administered 2017-10-15 – 2017-10-16 (×4): 20 mg via ORAL
  Filled 2017-10-15 (×4): qty 1

## 2017-10-15 MED ORDER — INSULIN PUMP
10.0000 | Freq: Every day | SUBCUTANEOUS | Status: DC | PRN
  Filled 2017-10-15: qty 1

## 2017-10-15 MED ORDER — INSULIN ASPART 100 UNIT/ML ~~LOC~~ SOLN
100.0000 [IU] | Freq: Once | SUBCUTANEOUS | Status: AC
  Administered 2017-10-15: 100 [IU] via SUBCUTANEOUS
  Filled 2017-10-15: qty 1

## 2017-10-15 MED ORDER — CLOPIDOGREL BISULFATE 75 MG PO TABS
75.0000 mg | ORAL_TABLET | Freq: Every day | ORAL | Status: DC
  Administered 2017-10-15 – 2017-10-16 (×2): 75 mg via ORAL
  Filled 2017-10-15 (×2): qty 1

## 2017-10-15 MED ORDER — LEVOTHYROXINE SODIUM 125 MCG PO TABS
175.0000 ug | ORAL_TABLET | Freq: Every day | ORAL | Status: DC
  Administered 2017-10-15 – 2017-10-16 (×2): 175 ug via ORAL
  Filled 2017-10-15 (×2): qty 1

## 2017-10-15 MED ORDER — ACETAMINOPHEN 650 MG RE SUPP: 650.0000 mg | Freq: Four times a day (QID) | RECTAL | Status: DC | PRN

## 2017-10-15 MED ORDER — INSULIN ASPART 100 UNIT/ML ~~LOC~~ SOLN
0.0000 [IU] | SUBCUTANEOUS | Status: DC
  Administered 2017-10-16: 1 [IU] via SUBCUTANEOUS

## 2017-10-15 MED ORDER — ACETAMINOPHEN 325 MG PO TABS: 650.0000 mg | ORAL_TABLET | Freq: Four times a day (QID) | ORAL | Status: DC | PRN

## 2017-10-15 MED ORDER — INSULIN PUMP
Freq: Three times a day (TID) | SUBCUTANEOUS | Status: DC
  Administered 2017-10-15 – 2017-10-16 (×3): via SUBCUTANEOUS
  Administered 2017-10-16: 3.6 via SUBCUTANEOUS
  Administered 2017-10-16: 12:00:00 via SUBCUTANEOUS
  Filled 2017-10-15: qty 1

## 2017-10-15 MED ORDER — ATORVASTATIN CALCIUM 40 MG PO TABS
40.0000 mg | ORAL_TABLET | Freq: Every day | ORAL | Status: DC
  Administered 2017-10-15 – 2017-10-16 (×2): 40 mg via ORAL
  Filled 2017-10-15 (×2): qty 1

## 2017-10-15 NOTE — Progress Notes (Signed)
Social Note:   [name] was sitting up in bed helping her grand daughter, [name], with math homework. She says that she is feeling much better from her recent stomach illness. She is eating and drinking regularly and would like to go home soon. She said that she was happy to see me and will make an appointment to come see me in early November.

## 2017-10-15 NOTE — Progress Notes (Signed)
Family Medicine Teaching Service Daily Progress Note Intern Pager: [phone]  Patient name: [name] Medical record number: [id number] Date of birth: 1951/10/29 Age: 66 y.o. Gender: female  Primary Care Provider: Bonnita Hollow, MD Consultants: Cardiology  Code Status: Full   Pt Overview and Major Events to Date:  Admitted to [facility] on 10/21  Assessment and Plan: [name] a 67 y.o.femalepast medical history significant for type 1 diabetes, hypertension, CVA, hyperlipidemia, hypothyroidism who presented with multiple episodes of diarrhea concerning for viral gastroenteritis versus infectious process.   Diarrhea, severe, acute-resolved Patient presented with multiple episodesof NBNBdiarrhea in the past 24 hours she reports about 12 episodes. Patient denies any sick contacts. No prior history of diarrhea. No recent antibiotic use, travel, change in dietary habits. Patient denies any fevers,chills, vitals are within normal limits. Mild hypokalemia and hyponatremia likely secondary to multiple episodes of diarrhea. No recent exposure to healthcare settings or antibiotic use. GI panel is negative, making viral etiology more likely. Patient tolerating diet well. No longer complains of diarrhea and states she has not had a bowel movement today so is unsure of if stool is formed or not.  -Advance diet as tolerated  -Enteric precautions -Tylenol 650 mg q6 prn  T1DM Patient currently has an insulin pump and states she would like to use this during inpatient. Most recent CBG of 222. -patient has been seen by diabetes coordinator, follow up on recommendations  -continue controlling BS with insulin pump  -consider outpatient follow up   Hypokalemia  K+3.3 on admission. Current K of 3.2. Current Mg 1.5. Likely secondary to GI losses. Will replete as needed in the setting of continue diarrhea. Per cardiology will need to keep K>4 and Mg >2. -continue to monitor  -replete K  with Kdur  -replete Mg with magnesium chloride (as mag ox has an increased risk of diarrhea)  Hyponatremia-resolved  Na on admission 134. Current Na of 135. Likely 2/2 GI losses. -replete as needed -continue to monitor   EKGs changes Multiple PVCs seen on EKGs creating irregular rhythm concerning for atrial fibrillation. Cardiology consulted, will plan to see. Per cardiology, patient has EKG showing sinus rhythm and sinus tachycardia with frequent PACs and aberrantly conducted PACs. Recommendations for no intervention at this time and outpatient follow up if patient becomes symptomatic  -Given electrolytes abnormalities will continue to monitor -cardiology consulted, appreciate recommendations   Hypertension BP on admission was 117/67. Current BP 114/40. Will hold blood pressure medication and resume as needed. -Hold losartan-hydrochlorothiazide 100-12.5 mg daily  Hyperlipidemia  Well controlled on home regimen -Continue atorvastatin 40 by mouth  Hypothyroidism TSH of 1.028 -synthroid 175 mcg daily   History of CVA Patient has a history of previous ischemic CVA back in 2016. No deficit noted on exam. -Continue Plavix 75 mg daily  GERD Well-managed with home regimen -Continue famotidine 20 mg bid  FEN/GI: NPO PPx: Patient on Plavix   Disposition: discharge to home   Subjective:  Patient today states she feels better and feels well rested. States she no longer is having diarrhea and has not had a bowel movement today. Patient denies CP, palpitations, SOB, dizziness, or lightheadedness. 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   Objective: Temp:  [98 F (36.7 C)-98.3 F (36.8 C)] 98 F (36.7 C) (10/23 0900) Pulse Rate:  [68-96] 82 (10/23 0900) Resp:  [18-20] 18 (10/23 0900) BP: (114-131)/(40-69) 131/69 (10/23  0900) SpO2:  [99 %-100 %] 100 % (10/23 0900) Weight:  [266 lb (120.7 kg)] 266 lb  (120.7 kg) (10/22 2128) Physical Exam: General: awake and alert, sitting at edge of bed, NAD Cardiovascular: irregular rhythm, no MRG Respiratory: CTAB, no wheezes, rales, or rhonchi Abdomen: soft, non tender, non distended, bowel sounds normal  Extremities: no edema, warm, well perfused   Laboratory:  Recent Labs Lab 10/14/17 2008 10/15/17 0804  WBC 11.3* 6.3  HGB 9.9* 10.5*  HCT 33.3* 33.5*  PLT 293 206    Recent Labs Lab 10/14/17 2008 10/14/17 2058 10/15/17 0355 10/16/17 0337  NA 128* 134* 138 135  K 6.4* 3.3* 3.5 3.2*  CL 89* 102 105 104  CO2 25 24 23 22   BUN 105* 15 11 10   CREATININE 17.11* 0.92 0.86 0.83  CALCIUM 8.0* 8.6* 8.2* 8.2*  PROT 6.9  --   --   --   BILITOT 0.9  --   --   --   ALKPHOS 73  --   --   --   ALT 19  --   --   --   AST 19  --   --   --   GLUCOSE 164* 184* 213* 276*    Ref. Range 10/16/2017 03:37  Magnesium Latest Ref Range: 1.7 - 2.4 mg/dL 1.5 [name] Baseman, DO 10/16/2017, 1:10 PM PGY-1, [place] Intern pager: [phone], text pages welcome

## 2017-10-15 NOTE — Progress Notes (Signed)
Family Medicine Teaching Service Daily Progress Note Intern Pager: [phone]  Patient name: [name] Medical record number: [id number] Date of birth: 1951-11-04 Age: 66 y.o. Gender: female  Primary Care Provider: Bonnita Hollow, MD Consultants: None Code Status: Full   Pt Overview and Major Events to Date:  Admitted to [facility] on 10/21  Assessment and Plan: [name] is a 66 y.o. female past medical history significant for type 1 diabetes, hypertension, CVA, hyperlipidemia, hypothyroidism who presented with multiple episodes of diarrhea concerning for viral gastroenteritis versus infectious process.   Diarrhea, severe, acute Patient presented with multiple episodes of NBNB diarrhea in the past 24 hours she reports about 12 episodes. Patient denies any sick contacts. No prior history of diarrhea. No recent antibiotic use, travel, change in dietary habits. Patient reports receiving flu shot 10/18. Patient denies any fevers, chills, vitals are within normal limits. Mild hypokalemia and hyponatremia likely secondary to multiple episodes of diarrhea. Given lack of systemic symptoms, have a low suspicion for infectious process. Differential diagnosis would include viral processes such as norovirus. C diff quick scan negative. No recent exposure to healthcare settings or antibiotic use. --Advance diet as tolerated  --GI panel pending  --Enteric precautions --Tylenol 650 mg q6 prn  T1DM Patient currently has an insulin pump which will be using fall basal regimen. Patient will be NPO.  --Sensitive sliding scale insulin --Monitor CBGs --patient has been seen by diabetes coordinator, follow up on recommendations   Hypokalemia-resolved K+3.3 on admission. Current K of 3.5. Likely secondary to GI losses. Will replete as needed in the setting of continue diarrhea. --continue to monitor   Hyponatremia-resolved  Na on admission 134. Current Na of 138. Likely 2/2 GI  losses. --replete as needed --continue to monitor   EKGs changes Multiple PVCs seen on EKGs creating irregular rhythm concerning for atrial fibrillation. Per cardiology, EKG showing ectopic atrial rhythm with p wave changes. Cardiology consulted, will plan to see -Given electrolytes abnormalities will continue to monitor -cardiology consulted, appreciate recommendations   Hypertension BP on admission was 117/67. Current BP 119/50. Patient is normotensive. Will hold blood pressure medication and resume as needed. --Hold losartan-hydrochlorothiazide 100-12.5 mg daily  Hyperlipidemia  Well controlled on home regimen --Continue atorvastatin 40  by mouth  Hypothyroidism TSH of 1.028 --synthroid 175 mcg daily   History of CVA Patient has a history of previous ischemic CVA back in 2016. No deficit noted on exam. --Continue Plavix 75 mg daily  GERD Well-managed with home regimen --Continue famotidine 20 mg bid  FEN/GI: NPO PPx: Patient on Plavix   Disposition: home following diarrhea resolution   Subjective:  Patient today states diarrhea is slightly improved in frequency but still having diarrhea. Patient states stool appears more formed then yesterday but is still very loose. Patient states it is still dark in color from pepto-bismol use. Patient has no complaints of chest pain or palpitations. States she has a history of irregular heartbeat but unsure of diagnosis. 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   Objective: Temp:  [98.1 F (36.7 C)-99.4 F (37.4 C)] 98.1 F (36.7 C) (10/22 0919) Pulse Rate:  [64-119] 82 (10/22 0919) Resp:  [13-23] 18 (10/22 0919) BP: (104-139)/(50-88) 113/88 (10/22 0919) SpO2:  [95 %-100 %] 99 % (10/22 0919) Weight:  [266 lb 12.1 oz (121 kg)] 266 lb 12.1 oz (121 kg) (10/22 0236) Physical Exam: General: awake and alert, laying in bed, NAD Cardiovascular: irregular rhythm, regular rate, no MRG Respiratory: CTAB, no wheezes, rales, or rhonchi  Abdomen:  soft, non tender, non distended, normal bowel sounds  Extremities: no edema, non tender   Laboratory:  Recent Labs Lab 10/14/17 2008 10/15/17 0804  WBC 11.3* 6.3  HGB 9.9* 10.5*  HCT 33.3* 33.5*  PLT 293 206    Recent Labs Lab 10/14/17 2008 10/14/17 2058 10/15/17 0355  NA 128* 134* 138  K 6.4* 3.3* 3.5  CL 89* 102 105  CO2 25 24 23   BUN 105* 15 11  CREATININE 17.11* 0.92 0.86  CALCIUM 8.0* 8.6* 8.2*  PROT 6.9  --   --   BILITOT 0.9  --   --   ALKPHOS 73  --   --   ALT 19  --   --   AST 19  --   --   GLUCOSE 164* 184* 213*     Ref. Range 10/15/2017 05:22  TSH Latest Ref Range: 0.350 - 4.500 uIU/mL 1.028    Caroline More, DO 10/15/2017, 1:38 PM PGY-1, [place] Intern pager: [phone], text pages welcome

## 2017-10-15 NOTE — ED Notes (Signed)
Attempted to call report

## 2017-10-15 NOTE — ED Notes (Signed)
Consulting provider at bedside

## 2017-10-15 NOTE — Discharge Summary (Signed)
[facility] Discharge Summary  Patient name: [name] Medical record number: [id number] Date of birth: [birth year] Age: 66 y.o. Gender: female Date of Admission: 10/14/2017  Date of Discharge: 10/16/2017 Admitting Physician: Zenia Resides, MD  Primary Care Provider: Bonnita Hollow, MD Consultants: None   Indication for Hospitalization: Severe diarrhea   Discharge Diagnoses/Problem List:  Diarrhea, severe T1DM Hypokalemia-resolved Hyponatreamia-resolved EKG changes HTN Hyperlipidemia Hypothyroidism History of CVA GERD  Disposition: home  Discharge Condition: stable, improving   Discharge Exam:  General: awake and alert, sitting at edge of bed, NAD Cardiovascular: irregular rhythm, no MRG Respiratory: CTAB, no wheezes, rales, or rhonchi Abdomen: soft, non tender, non distended, bowel sounds normal  Extremities: no edema, warm, well perfused   Brief Hospital Course:  [name] is a 66 y.o. female presenting with profuse NBNB diarrhea beginning the morning of 10/21. She reported having >12 watery bowel movements. Patient tried diet changes and pepto-bismol with no relief, but pepto-bismol changed the color of her stool to black. While admitted diarrhea improved and patient's diet was advanced and tolerated well. GI panel was negative and C-diff was negative.   In ED patient noted to be both hyponatremic, Na 134, and hypokalemic, K 3.3, likely 2/2 GI losses. Both resolved with fluid hydration and on discharge Na was 135 and K was 3.2.    During admission patient had repeated EKGs showing abnormal rhythms. Cardiology was consulted who read EKG as ectopic atrial rhythm with p wave changes. Cardiology recommendations of no intervention at this time and outpatient follow up if patient becomes symptomatic.   Issues for Follow Up:  1. Maintain adequate oral hydration  2. Follow up with cardiology if becoming symptomatic  3. Follow up  with diabetes management  4. Follow K and Na - replete as needed  5. Follow up for irregular rhythm   Significant Procedures: None   Significant Labs and Imaging:   Recent Labs Lab 10/14/17 2008 10/15/17 0804  WBC 11.3* 6.3  HGB 9.9* 10.5*  HCT 33.3* 33.5*  PLT 293 206    Recent Labs Lab 10/14/17 2008 10/14/17 2058 10/15/17 0355 10/16/17 0337  NA 128* 134* 138 135  K 6.4* 3.3* 3.5 3.2*  CL 89* 102 105 104  CO2 25 24 23 22   GLUCOSE 164* 184* 213* 276*  BUN 105* 15 11 10   CREATININE 17.11* 0.92 0.86 0.83  CALCIUM 8.0* 8.6* 8.2* 8.2*  MG 1.5*  --   --  1.5*  ALKPHOS 73  --   --   --   AST 19  --   --   --   ALT 19  --   --   --   ALBUMIN 3.2*  --   --   --      Ref. Range 10/16/2017 03:37  Magnesium Latest Ref Range: 1.7 - 2.4 mg/dL 1.5 (L)    Results/Tests Pending at Time of Discharge:  [employer]     Ordered   10/16/17 8341  Basic metabolic panel  Daily,   R    Question:  Specimen collection method  Answer:  Lab=Lab collect   10/15/17 1258   10/16/17 0500  Magnesium  Daily,   R    Question:  Specimen collection method  Answer:  Lab=Lab collect   10/15/17 1810      Discharge Medications:  Allergies as of 10/16/2017   No Known Allergies     Medication List  TAKE these medications   acetaminophen 500 MG tablet Commonly known as:  TYLENOL Take 1-2 tablets (500-1,000 mg total) by mouth every 8 (eight) hours as needed for mild pain or moderate pain.   atorvastatin 40 MG tablet Commonly known as:  LIPITOR TAKE 1 TABLET(40 MG) BY MOUTH DAILY   clopidogrel 75 MG tablet Commonly known as:  PLAVIX TAKE 1 TABLET BY MOUTH DAILY   fexofenadine 30 MG tablet Commonly known as:  ALLEGRA Take 30 mg by mouth daily as needed (allergies).   insulin pump Soln Inject 10 each into the skin daily as needed (insulin levels.). humalog insulin Checks sugar four times daily, can use up to 10 units.   levothyroxine 175 MCG tablet Commonly known as:   SYNTHROID, LEVOTHROID Take 175 mcg by mouth daily.   losartan-hydrochlorothiazide 100-12.5 MG tablet Commonly known as:  HYZAAR Take 1 tablet by mouth daily.   multivitamin with minerals Tabs tablet Take 1 tablet by mouth daily.   potassium gluconate 595 (99 K) MG Tabs tablet Take 595 mg by mouth daily.   ranitidine 150 MG tablet Commonly known as:  ZANTAC Take 150 mg by mouth 2 (two) times daily.       Discharge Instructions: Please refer to Patient Instructions section of EMR for full details.  Patient was counseled important signs and symptoms that should prompt return to medical care, changes in medications, dietary instructions, activity restrictions, and follow up appointments.   Follow-Up Appointments: Follow-up Information    Bonnita Hollow, MD Follow up on 10/19/2017.   Specialty:  Family Medicine Why:  @3 :15 pm (please arrive 15 min early) Contact information: [address] [place] [address] [place], [place], DO 10/16/2017, 3:22 PM PGY-1, [facility]

## 2017-10-15 NOTE — H&P (Signed)
[facility] Admission History and Physical Service Pager: [phone]  Patient name: [name] Medical record number: [id number] Date of birth: June 21, 1951 Age: 66 y.o. Gender: female  Primary Care Provider: Bonnita Hollow, MD Consultants: None Code Status: Full   Chief Complaint: Severe diarrhea  Assessment and Plan: [name] is a 66 y.o. female past medical history significant for type 1 diabetes, hypertension, CVA, hyperlipidemia, hypothyroidism who presented with multiple episodes of diarrhea concerning for viral gastroenteritis versus infectious process.   #Diarrhea, severe, acute Patient presented with multiple episodes of NBNB diarrhea in the past 24 hours she reports about 12 episodes. Patient denies any sick contacts. No prior history of diarrhea. No recent antibiotic use, travel, change in dietary habits. Patient reports receiving flu shot 10/18. Patient denies any fevers, chills, vitals are within normal limits. Mild hypokalemia and hyponatremia likely secondary to multiple episodes of diarrhea. Given lack of systemic symptoms, have a low suspicion for infectious process. Differential diagnosis would include viral processes such as norovirus, would also include C. Difficile though lower in our differential given  No recent exposure to healthcare settings or antibiotic use. --Admit to [facility], admitting physician Dr. Andria Frames --Make patient nothing by mouth --Start NS 100 cc/hr --Follow up on GI panel --Enteric precautions --Tylenol 650 mg q6 prn  #T1DM Patient currently has an insulin pump which will be using fall basal regimen. Patient will be NPO.  --Sensitive sliding scale insulin --Monitor CBGs  #Hypokalemia  K+3.3. Likely secondary to GI losses. Will replete as needed in the setting of continue diarrhea. --Continue NS with KCL 20 mEq --Follow up on am bmp  #EKGs changes Multiple PVCs seen on EKGs creating irregular rhythm  concerning for atrial fibrillation. Upon further evaluation, patient does not have atrial fibrillation with p waves noted. Given electrolytes abnormalities will continue to monitor.  #Hypertension BP on admission was 117/67. Patient is normotensive. Will hold blood pressure medication and resume as needed. --Hold losartan-hydrochlorothiazide 100-12.5 mg daily  #Hyperlipidemia  Well controlled on home regimen --Continue atorvastatin 40  by mouth  #Hypothyroidism Last TSH was 8.49 (08/08/2016).  #History of CVA Patient has a history of previous ischemic CVA back in 2016. No deficit noted on exam. --Continue Plavix 75 mg daily  #GERD Well-managed with home regimen --Continue famotidine 20 mg bid  FEN/GI: NPO Prophylaxis: Patient on Plavix  Disposition: Likely home pending diarrhea resolution  History of Present Illness:  [name] is a 66 yo female past medical history significant for type 1 diabetes, hypothyroidism, CVA, hyperlipidemia, hypertension, GERD, who presented today with multiple episode of diarrhea. Patient reports that she started having profuse nonbloody nonbilious diarrhea Sunday morning. She reported having over 12 bowel movement and all have been watery diarrhea. Patient denies any sick contacts, recent antibiotic use, travel, diet change. Patient had tried rice, yogurt, and potato to help stop her diarrhea but they did not help. Patient also try Pepto-Bismol she reports made his stool black without any improvement in her diarrhea. Patient denies any chest pain, shortness of breath, abdominal pain, dizziness, headache. In the ED, patient was mildly hyponatremic and hypokalemic. Vital signs were stable. UA was unremarkable. GI panel urine culture were collected.  Review Of Systems: Per HPI with the following additions:   Review of Systems  Constitutional: Negative.   HENT: Negative.   Eyes: Negative.   Respiratory: Negative.   Cardiovascular: Negative.    Gastrointestinal: Positive for diarrhea.  Genitourinary: Negative.   Musculoskeletal: Negative.  Skin: Negative.   Neurological: Negative.   Endo/Heme/Allergies: Negative.   Psychiatric/Behavioral: Negative.     Patient Active Problem List   Diagnosis Date Noted  . Diarrhea 10/15/2017  . BPPV (benign paroxysmal positional vertigo), right 06/13/2017  . Premature atrial beats 06/13/2017  . Snoring 07/31/2016  . Pre-ulcerative corn or callous 12/15/2015  . History of CHF (congestive heart failure) 10/29/2015  . Essential hypertension 10/29/2015  . 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 Toe injury 09/24/2015  . Thyroid activity decreased   . Intracranial vascular stenosis   . HLD (hyperlipidemia)   . 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 CVA (cerebral vascular accident) ([place]) 08/29/2015  . Preventative health care 09/30/2014  . Special screening for malignant neoplasms, colon 09/10/2014  . Great toe pain 07/09/2014  . Posterior tibial tendinitis of right leg 04/26/2014  . Complicated UTI (urinary tract infection) 12/24/2013  . Preventive measure 08/20/2013  . Morbid obesity ([place]) 05/29/2013  . Back pain without radiation 04/28/2013  . Nail dystrophy 01/10/2013  . Diabetes mellitus type I ([place]) 01/25/2012  . Hyperlipidemia 01/25/2012  . Hypothyroidism 01/25/2012  . Hypertension 01/25/2012  . GERD (gastroesophageal reflux disease) 01/25/2012    Past Medical History: Past Medical History:  Diagnosis Date  . Anemia   . Arthritis   . CHF (congestive heart failure) ([place])   . Depression   . GERD (gastroesophageal reflux disease)   . Hemorrhoids   . Hyperlipidemia   . Hypertension   . Hypothyroidism   . Seasonal allergies   . Stroke ([place])   . Thyroid cancer ([place])    per pt S/p Total Thyroidectomy with Radioactive Iodine Therapy  . Type 1 diabetes mellitus ([place])     Past Surgical  History: Past Surgical History:  Procedure Laterality Date  . ABDOMINAL ANGIOGRAM N/A 05/14/2012   Procedure: ABDOMINAL ANGIOGRAM;  Surgeon: Laverda Page, MD;  Location: [facility] CATH LAB;  Service: Cardiovascular;  Laterality: N/A;  . ABDOMINAL HYSTERECTOMY  1995   partial  . ANKLE FRACTURE SURGERY Right   . CARDIAC CATHETERIZATION     with coronary angiogram  . COLONOSCOPY N/A 09/09/2014   Procedure: COLONOSCOPY;  Surgeon: Gatha Mayer, MD;  Location: [facility];  Service: Endoscopy;  Laterality: N/A;  . LEFT AND RIGHT HEART CATHETERIZATION WITH CORONARY ANGIOGRAM N/A 05/14/2012   Procedure: LEFT AND RIGHT HEART CATHETERIZATION WITH CORONARY ANGIOGRAM;  Surgeon: Laverda Page, MD;  Location[facility] CATH LAB;  Service: Cardiovascular;  Laterality: N/A;  . MINI METER   09/08/2014   ELECTRONIC INSULIN PUMP  . THYROIDECTOMY  2006    Social History: Social History  Substance Use Topics  . Smoking status: Never Smoker  . Smokeless tobacco: Never Used  . Alcohol use No   Additional social history:  Please also refer to relevant sections of EMR.  Family History: Family History  Problem Relation Age of Onset  . Heart disease Mother        MI  . Hyperlipidemia Mother   . Hypertension Mother   . Renal Disease Mother        insufficiency  . Early death Father        Head Injury at Work  . Hyperlipidemia Father   . Hypertension Father   . Stroke Son   . Diabetes Maternal Aunt   . Prostate cancer Paternal Uncle   . Heart disease Maternal Aunt   . Breast cancer Neg  Hx    (If not completed, MUST add something in)  Allergies and Medications: No Known Allergies No current facility-administered medications on file prior to encounter.    Current Outpatient Prescriptions on File Prior to Encounter  Medication Sig Dispense Refill  . acetaminophen (TYLENOL) 500 MG tablet Take 1-2 tablets (500-1,000 mg total) by mouth every 8 (eight) hours as needed for mild pain or moderate pain. 60  tablet 2  . atorvastatin (LIPITOR) 40 MG tablet TAKE 1 TABLET(40 MG) BY MOUTH DAILY 90 tablet 3  . clopidogrel (PLAVIX) 75 MG tablet TAKE 1 TABLET BY MOUTH DAILY 90 tablet 4  . fexofenadine (ALLEGRA) 30 MG tablet Take 30 mg by mouth daily as needed (allergies).     . Insulin Human (INSULIN PUMP) SOLN Inject 10 each into the skin daily as needed (insulin levels.). humalog insulin Checks sugar four times daily, can use up to 10 units.    Marland Kitchen levothyroxine (SYNTHROID, LEVOTHROID) 175 MCG tablet Take 175 mcg by mouth daily.  12  . losartan-hydrochlorothiazide (HYZAAR) 100-12.5 MG tablet Take 1 tablet by mouth daily. 90 tablet 1  . Multiple Vitamin (MULTIVITAMIN WITH MINERALS) TABS tablet Take 1 tablet by mouth daily.    . potassium gluconate 595 (99 K) MG TABS tablet Take 595 mg by mouth daily.    . ranitidine (ZANTAC) 150 MG tablet Take 150 mg by mouth 2 (two) times daily.      Objective: BP 139/61   Pulse 77   Temp 99.4 F (37.4 C) (Oral)   Resp 18   SpO2 97%  Physical Exam: General: NAD, pleasant, able to participate in exam Cardiac: RRR, normal heart sounds, no murmurs. 2+ radial and PT pulses bilaterally Respiratory: CTAB, normal effort, No wheezes, rales or rhonchi Abdomen: soft, nontender, nondistended, no hepatic or splenomegaly, +BS Extremities: no edema or cyanosis. WWP. Skin: warm and dry, no rashes noted Neuro: alert and oriented x4, no focal deficits Psych: Normal affect and mood  Labs and Imaging: CBC BMET   Recent Labs Lab 10/14/17 2008  WBC 11.3*  HGB 9.9*  HCT 33.3*  PLT 293    Recent Labs Lab 10/14/17 2058  NA 134*  K 3.3*  CL 102  CO2 24  BUN 15  CREATININE 0.92  GLUCOSE 184*  CALCIUM 8.6*      Wilba Mutz, Earna Coder, MD 10/15/2017, 2:03 AM PGY-2, [place] Intern pager: [phone], text pages welcome

## 2017-10-15 NOTE — Progress Notes (Signed)
New Admission Note:  Arrival Method: By bed from ED around 0300 Mental Orientation: Alert and oriented Telemetry: Box 11, CCMD notified Assessment: Completed Skin: Completed, refer to flowsheets IV: Right forearm Pain: Denies Tubes: None Safety Measures: Safety Fall Prevention Plan was given, discussed  Admission: Completed 2 [place] Orientation: Patient has been orientated to the room, unit and the staff. Family: None  Orders have been reviewed and implemented. Will continue to monitor the patient. Call light has been placed within reach   Perry Mount, RN  Phone Number: [phone]

## 2017-10-15 NOTE — Care Management Obs Status (Signed)
[place] NOTIFICATION   Patient Details  Name: [name] MRN: [id number] Date of Birth: 11-08-1951   Medicare Observation Status Notification Given:  Yes    Krishav Mamone, Rory Percy, RN 10/15/2017, 5:15 PM

## 2017-10-15 NOTE — Consult Note (Signed)
Cardiology was consulted for atrial fibrillation.  Telemetry and EKGs show sinus rhythm and sinus tachycardia with frequent PACs and aberrantly conducted PACs.  No intervention required.  Maintain K>4 and Mg >2.  We would be happy to see her as an outpatient if she is symptomatic and it is needed.  Dowell Hoon C. Oval Linsey, MD, [facility] 10/15/2017  5:42 PM

## 2017-10-15 NOTE — Progress Notes (Signed)
Inpatient Diabetes Program Recommendations  AACE/ADA: New Consensus Statement on Inpatient Glycemic Control (2015)  Target Ranges:  Prepandial:   less than 140 mg/dL      Peak postprandial:   less than 180 mg/dL (1-2 hours)      Critically ill patients:  140 - 180 mg/dL   Lab Results  Component Value Date   GLUCAP 220 (H) 10/15/2017   HGBA1C 9.8 06/13/2017    Review of Glycemic ControlResults for [name], [name] (MRN [id number]) as of 10/15/2017 09:37  Ref. Range 10/14/2017 20:22 10/15/2017 02:29 10/15/2017 08:42  Glucose-Capillary Latest Ref Range: 65 - 99 mg/dL 191 (H) 222 (H) 220 (H)   Diabetes history: Type 1 diabetes- see's [name] Outpatient Diabetes medications:  Insulin pump 630G-verified pump settings 12-4:30a-0.550 units/hr 4:30-9a - 0.775 units/hr 9-12:00p-1.25 units/hr 12-1:30p-0.95 units/hr 1:30-3p-  1.10 units/hr 3-6p-       1 unit/hr 6-7:30p-  1.1 units/hr 7:30-12MN- 0.95 units/hr 1 unit/6 grams of CHO Correction factor 1 unit for every 50 mg/dL greater than 120 mg/dL  Spoke with patient.  She states that it is time for her to change her site.  Her brother is bringing new supplies for her to change her site.  She has not been correcting her elevated blood sugars since she was unable to eat.  She is still having diarrhea.  She has flowsheet at the bedside and has signed the consent.  Discussed with MD and orders received for insulin pump order set.    Will follow.  Thanks, Adah Perl, RN, BC-ADM Inpatient Diabetes Coordinator Pager [phone] (8a-5p)

## 2017-10-16 DIAGNOSIS — R197 Diarrhea, unspecified: Secondary | ICD-10-CM | POA: Diagnosis not present

## 2017-10-16 LAB — BASIC METABOLIC PANEL
ANION GAP: 9 (ref 5–15)
BUN: 10 mg/dL (ref 6–20)
CALCIUM: 8.2 mg/dL — AB (ref 8.9–10.3)
CHLORIDE: 104 mmol/L (ref 101–111)
CO2: 22 mmol/L (ref 22–32)
Creatinine, Ser: 0.83 mg/dL (ref 0.44–1.00)
GFR calc non Af Amer: >60 mL/min (ref >60)
Glucose, Bld: 276 mg/dL — ABNORMAL HIGH (ref 65–99)
Potassium: 3.2 mmol/L — ABNORMAL LOW (ref 3.5–5.1)
SODIUM: 135 mmol/L (ref 135–145)

## 2017-10-16 LAB — URINE CULTURE
Culture: NO GROWTH
SPECIAL REQUESTS: NORMAL

## 2017-10-16 LAB — GLUCOSE, CAPILLARY
GLUCOSE-CAPILLARY: 235 mg/dL — AB (ref 65–99)
GLUCOSE-CAPILLARY: 222 mg/dL — AB (ref 65–99)

## 2017-10-16 LAB — MAGNESIUM: MAGNESIUM: 1.5 mg/dL — AB (ref 1.7–2.4)

## 2017-10-16 MED ORDER — MAGNESIUM CHLORIDE 64 MG PO TBEC
2.0000 | DELAYED_RELEASE_TABLET | Freq: Every day | ORAL | Status: DC
  Administered 2017-10-16: 128 mg via ORAL
  Filled 2017-10-16: qty 2

## 2017-10-16 MED ORDER — POTASSIUM CHLORIDE CRYS ER 20 MEQ PO TBCR
40.0000 meq | EXTENDED_RELEASE_TABLET | ORAL | Status: AC
  Administered 2017-10-16 (×2): 40 meq via ORAL
  Filled 2017-10-16 (×2): qty 2

## 2017-10-16 NOTE — Care Management Note (Signed)
Case Management Note  Patient Details  Name: [name] MRN: [id number] Date of Birth: 12-26-1950  Subjective/Objective:     CM following for progression and d/c planning.                Action/Plan: 10/16/2017 No HH or DME needs , plan for d/c to home pt independent however also has family support. No d/c needs identified.   Expected Discharge Date:      10/16/2017            Expected Discharge Plan:  Home/Self Care  In-House Referral:  NA  Discharge planning Services  NA  Post Acute Care Choice:  NA Choice offered to:  NA  DME Arranged:  N/A DME Agency:  NA  HH Arranged:  NA HH Agency:  NA  Status of Service:  Completed, signed off  If discussed at [place] of Stay Meetings, dates discussed:    Additional Comments:  Adron Bene, RN 10/16/2017, 2:50 PM

## 2017-10-16 NOTE — Discharge Instructions (Signed)
You were admitted for diarrhea. You improved with IV fluids and you tolerated your diet.   Please follow up with your PCP Dr. Grandville Silos on 10/26 @3 :15pm  If you develop cardiac symptoms please follow up with cardiology  If you develop symptoms of dizziness, lightheadedness, fever, fatigue, chest pain, or palpitations go to the emergency room or call your PCP.

## 2017-10-17 LAB — GLUCOSE, CAPILLARY: GLUCOSE-CAPILLARY: 166 mg/dL — AB (ref 65–99)

## 2017-10-19 ENCOUNTER — Ambulatory Visit (INDEPENDENT_AMBULATORY_CARE_PROVIDER_SITE_OTHER): Payer: Medicare Other | Admitting: Family Medicine

## 2017-10-19 ENCOUNTER — Encounter: Payer: Self-pay | Admitting: Family Medicine

## 2017-10-19 VITALS — BP 122/84 | HR 91 | Temp 98.5°F | Wt 260.0 lb

## 2017-10-19 DIAGNOSIS — I491 Atrial premature depolarization: Secondary | ICD-10-CM | POA: Diagnosis not present

## 2017-10-19 DIAGNOSIS — E876 Hypokalemia: Secondary | ICD-10-CM

## 2017-10-19 DIAGNOSIS — E109 Type 1 diabetes mellitus without complications: Secondary | ICD-10-CM

## 2017-10-19 DIAGNOSIS — E1065 Type 1 diabetes mellitus with hyperglycemia: Secondary | ICD-10-CM

## 2017-10-19 DIAGNOSIS — K219 Gastro-esophageal reflux disease without esophagitis: Secondary | ICD-10-CM

## 2017-10-19 LAB — POCT GLYCOSYLATED HEMOGLOBIN (HGB A1C): Hemoglobin A1C: 9.4

## 2017-10-19 MED ORDER — RANITIDINE HCL 150 MG PO TABS: 150.0000 mg | ORAL_TABLET | Freq: Two times a day (BID) | ORAL | 3 refills | Status: DC

## 2017-10-19 MED ORDER — MAGNESIUM 30 MG PO TABS: 30.0000 mg | ORAL_TABLET | Freq: Two times a day (BID) | ORAL | 0 refills | Status: AC

## 2017-10-19 MED ORDER — POT BICARB-POT CHLORIDE 25 MEQ PO TBEF: 1.0000 | EFFERVESCENT_TABLET | Freq: Every day | ORAL | 0 refills | Status: DC

## 2017-10-19 NOTE — Assessment & Plan Note (Signed)
Will replete with oral supplament

## 2017-10-19 NOTE — Assessment & Plan Note (Signed)
Recent diagnosis in the hospital. Still present on exam today. Pt had mildly low K/Mg on discharge. Possibly due to these electrolyte abnormalities. Will recheck K and Mg and replete with oral supplements. Have pt return in 1 week. If irregular rate still present. Will refer to cardiology.

## 2017-10-19 NOTE — Assessment & Plan Note (Signed)
Stable on zantac. Will refill.

## 2017-10-19 NOTE — Patient Instructions (Signed)
It was a pleasure to see you today! Thank you for choosing [facility] for your primary care. [name] was seen for hospital follow up. You are also continuing to have an irregular heart rate. This can be due to your low electrolytes. Today we are going to recheck some of your labs. We are also going to replete your low electrolytes of potassium and magnesium. Please stop taking the potassium you take at home. Please start taking the prescribed potassium and magnesium from your pharmacist. Come back in one week, where we will recheck your heart. If it is still irregular, we will refer you to cardiology for further work up.   Best,  Marny Lowenstein, MD, [place] - PGY1 10/19/2017 4:06 PM

## 2017-10-19 NOTE — Assessment & Plan Note (Addendum)
Pt was hyperglycemic in 200s while in the hospital. A1C still elevated at 9.4. Pt is currently managed with insulin pump and her endocrinologist Dr. Zollie Scale. She is planning on following up with her in February. Will not adjust therapy at this time.

## 2017-10-19 NOTE — Progress Notes (Signed)
Subjective:  [name] is a 66 y.o. female who presents to the [facility] today with a chief complaint of hospital follow up for diarrhea.   HPI: Diarrhea Hospitalized from 10/21 - 10/23 for diarrhea and dehydration. Negative C diff and GI stool studies. No longer having diarrhea. No nausea and vomiting. Pt had a normal bowel movement yesterday.   Electrolyte abnormalities:  K+ on discharge was mildly low at 3.2. Na+ normal 135. Mg was low at 1.5. Pt is eating and drinking well. Pt is already on a potassium supplement. She was started on this by her cardiologies after stroke.   Diabetes Type I: glucose elevated in the 276. Has insulin pump. Pt reports being able to cover supplies. Pt follows with endocrinologist [name].  A1C today was 9.4. Previously on 6/20 was 9.8.   Irregular heart rhythm.  Irregular heart rhythm discovered in hospital. Cardiology described it at sinus rhythm with PACs. Pt is unaware when it is beating irregularly. Hospital note attributes this to electrolyte abnormalities from diarrhea. She denies headache and chest pain.   Pt already has flu shot at [employer].   GERD Pt has history of GERD. Symptoms well controlled on zantac 150 mg BID. Pt request refill today.   ROS: Per HPI  PMH: DMI, Irregular Heart Rhythm  Objective:  Physical Exam: BP 122/84   Pulse 91   Temp 98.5 F (36.9 C) (Oral)   Wt 260 lb (117.9 kg)   SpO2 99%   BMI 40.72 kg/m   Gen: NAD, resting comfortably CV: irregularly regular heart rhythm, normal s1/s2 Pulm: NWOB, CTAB with no crackles, wheezes, or rhonchi GI: Normal bowel sounds present. Soft, Nontender, Nondistended.  Results for orders placed or performed in visit on 10/19/17 (from the past 72 hour(s))  HgB A1c     Status: Abnormal   Collection Time: 10/19/17  3:24 PM  Result Value Ref Range   Hemoglobin A1C 9.4      Assessment/Plan:  Premature atrial beats Recent diagnosis in the hospital. Still present on exam  today. Pt had mildly low K/Mg on discharge. Possibly due to these electrolyte abnormalities. Will recheck K and Mg and replete with oral supplements. Have pt return in 1 week. If irregular rate still present. Will refer to cardiology.   GERD (gastroesophageal reflux disease) Stable on zantac. Will refill.   Hypokalemia Will replete with oral supplament  Hypomagnesemia Will replete with oral supplament  Controlled type 1 diabetes mellitus with hyperglycemia, with long-term current use of insulin (HCC) Pt was hyperglycemic in 200s while in the hospital. A1C still elevated at 9.4. Pt is currently managed with insulin pump and her endocrinologist Dr. Zollie Scale. She is planning on following up with her in February. Will not adjust therapy at this time.     Lab Orders     Basic Metabolic Panel     Magnesium     HgB A1c  Meds ordered this encounter  Medications  . POT BICARB-POT CHLORIDE,25MEQ, 25 MEQ TBEF    Sig: Take 1 tablet (25 mEq total) by mouth daily.    Dispense:  90 tablet    Refill:  0  . magnesium 30 MG tablet    Sig: Take 1 tablet (30 mg total) by mouth 2 (two) times daily.    Dispense:  180 tablet    Refill:  0  . ranitidine (ZANTAC) 150 MG tablet    Sig: Take 1 tablet (150 mg total) by mouth 2 (two) times daily.  Dispense:  90 tablet    Refill:  3    Marny Lowenstein, MD, [place] - PGY1 10/19/2017 5:04 PM

## 2017-10-20 LAB — BASIC METABOLIC PANEL
BUN/Creatinine Ratio: 10 — ABNORMAL LOW (ref 12–28)
BUN: 9 mg/dL (ref 8–27)
CALCIUM: 9.2 mg/dL (ref 8.7–10.3)
CHLORIDE: 101 mmol/L (ref 96–106)
CO2: 24 mmol/L (ref 20–29)
Creatinine, Ser: 0.91 mg/dL (ref 0.57–1.00)
GFR calc Af Amer: 76 mL/min/{1.73_m2} (ref >59)
GFR calc non Af Amer: 66 mL/min/{1.73_m2} (ref >59)
Glucose: 219 mg/dL — ABNORMAL HIGH (ref 65–99)
POTASSIUM: 4.5 mmol/L (ref 3.5–5.2)
Sodium: 141 mmol/L (ref 134–144)

## 2017-10-20 LAB — MAGNESIUM: Magnesium: 1.7 mg/dL (ref 1.6–2.3)

## 2017-10-22 ENCOUNTER — Telehealth: Payer: Self-pay | Admitting: *Deleted

## 2017-10-22 NOTE — Telephone Encounter (Signed)
-----   Message from Valerie Roys, [place] sent at 10/22/2017 11:51 AM EDT ----- Can you please call Ms. Gail and tell her that her Magnesium and Potassium levels are normal and that she can stop taking the magnesium and potassium supplements, but tell her to keep her follow up appointment for her heart.  Dr. Grandville Silos

## 2017-10-22 NOTE — Telephone Encounter (Signed)
Patient informed of results and reminded of her upcoming appointment. Garlon Tuggle,CMA

## 2017-10-29 ENCOUNTER — Other Ambulatory Visit: Payer: Self-pay | Admitting: Family Medicine

## 2017-10-29 ENCOUNTER — Ambulatory Visit: Payer: Medicare Other | Admitting: Family Medicine

## 2017-10-29 ENCOUNTER — Other Ambulatory Visit: Payer: Self-pay

## 2017-10-29 ENCOUNTER — Encounter: Payer: Self-pay | Admitting: Family Medicine

## 2017-10-29 ENCOUNTER — Ambulatory Visit (HOSPITAL_COMMUNITY)
Admission: RE | Admit: 2017-10-29 | Discharge: 2017-10-29 | Disposition: A | Discharge status: Home / Self Care | Payer: Medicare Other | Source: Ambulatory Visit | Attending: Family Medicine | Admitting: Family Medicine

## 2017-10-29 VITALS — BP 116/82 | HR 72 | Temp 98.3°F | Wt 255.0 lb

## 2017-10-29 DIAGNOSIS — R009 Unspecified abnormalities of heart beat: Secondary | ICD-10-CM | POA: Diagnosis not present

## 2017-10-29 DIAGNOSIS — I491 Atrial premature depolarization: Secondary | ICD-10-CM | POA: Diagnosis not present

## 2017-10-29 DIAGNOSIS — I517 Cardiomegaly: Secondary | ICD-10-CM | POA: Insufficient documentation

## 2017-10-29 NOTE — Patient Instructions (Addendum)
Thank you for coming to see me today. It was a pleasure! Today we talked about:   Your irregular heart rhythm, as a follow-up from your hospital stay.  Your EKG showed that you have some premature beats. These are currently not worrisome. If you develop symptoms such as dizziness or lightheadedness when doing activity. Please follow-up with your PCP, Dr. Grandville Silos. Also, if you experience any chest pain, shortness of breath, or your heart racing please call or go to the ED.  If you have any questions or concerns, please do not hesitate to call the office at [phone].  Take Care,   [name] Jamaris Biernat, DO

## 2017-10-29 NOTE — Progress Notes (Signed)
   Subjective:    Patient ID: [name], female    DOB: July 07, 1951, 66 y.o.   MRN: [id number]   CC: Follow-up for irregular heart rhythm  HPI:  Irregular heart rhythm: Irregular heart rhythm discovered in the hospital.  Cardiology described it as sinus rhythm with PACs.  Patient previously unaware when it was beating irregularly.  Hospital notes attribute this to the electrolyte abnormalities patient was experiencing from diarrhea. - Patient denies any symptoms still.  - Reports that her son has a history of irregular heart rhythm.  He also suffered from a stroke.  -Denies any chest pain, shortness of breath, lightheadedness, dizziness, palpitations or weakness.  Patient had flu shot at [facility].  Smoking status reviewed  Review of Systems  Per HPI.   Patient Active Problem List   Diagnosis Date Noted  . Diarrhea 10/15/2017  . Dehydration   . Frequent PVCs   . Hypokalemia   . Controlled type 1 diabetes mellitus with hyperglycemia, with long-term current use of insulin ([place])   . Hypomagnesemia   . BPPV (benign paroxysmal positional vertigo), right 06/13/2017  . Premature atrial beats 06/13/2017  . Snoring 07/31/2016  . Pre-ulcerative corn or callous 12/15/2015  . History of CHF (congestive heart failure) 10/29/2015  . Essential hypertension 10/29/2015  . Cerebrovascular accident (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 ([place]) 10/29/2015  . Toe injury 09/24/2015  . Thyroid activity decreased   . Intracranial vascular stenosis   . HLD (hyperlipidemia)   . Cerebral infarction due to thrombosis of basilar artery ([place])   . CVA (cerebral vascular accident) ([place]) 08/29/2015  . Preventative health care 09/30/2014  . Special screening for malignant neoplasms, colon 09/10/2014  . Great toe pain 07/09/2014  . Posterior tibial tendinitis of right leg 04/26/2014  . Complicated UTI (urinary tract infection) 12/24/2013  . Preventive measure 08/20/2013  . Morbid obesity ([place])  05/29/2013  . Back pain without radiation 04/28/2013  . Nail dystrophy 01/10/2013  . Diabetes mellitus type I ([place]) 01/25/2012  . Hyperlipidemia 01/25/2012  . Hypothyroidism 01/25/2012  . Hypertension 01/25/2012  . GERD (gastroesophageal reflux disease) 01/25/2012     Objective:  BP 116/82   Pulse 72   Temp 98.3 F (36.8 C) (Oral)   Wt 255 lb (115.7 kg)   SpO2 99%   BMI 39.94 kg/m  Vitals and nursing note reviewed  General: NAD, pleasant Cardiac: Irregularly regular heart rhythm, normal heart sounds, no murmurs. 2+ radial and PT pulses bilaterally Respiratory: CTAB, normal effort Extremities: no edema or cyanosis. WWP. Skin: warm and dry, no rashes noted Neuro: alert and oriented, no focal deficits  EKG and 12 second strip reviewed showing premature supraventricular complexes with compensatory delays.  Assessment & Plan:    Premature atrial beats Recently diagnosed in the hospital.  Present on exam today.  At last office visit patient no longer had any electrolyte abnormalities from recent illness with diarrhea.  Patient has stopped taking her K and Mg.  Patient given return precautions such as development of chest pain, lightheadedness, dizziness.  She was instructed to return to see her PCP if she develops any symptoms.    [name] Kiandre Spagnolo, DO Family Medicine Resident PGY-1

## 2017-10-29 NOTE — Assessment & Plan Note (Addendum)
Recently diagnosed in the hospital.  Present on exam today.  At last office visit patient no longer had any electrolyte abnormalities from recent illness with diarrhea.  Patient has stopped taking her K and Mg.  Patient given return precautions such as development of chest pain, lightheadedness, dizziness.  She was instructed to return to see her PCP if she develops any symptoms.

## 2017-10-30 ENCOUNTER — Telehealth: Payer: Self-pay | Admitting: *Deleted

## 2017-10-30 NOTE — Telephone Encounter (Addendum)
Spoke with patient about [employer] research study updated ICF mailed to her. Informed her if she would like to continue please sign, date in marked areas and mail back to research office with provided envelope. All follow up visit will be completed via phone. Patient agrees and will sign and return.  Update: Signed ICF (IRB dated 26/Sep/2018) for the [facility] research study received. Final copy will be mailed to patient for her records

## 2017-10-31 ENCOUNTER — Encounter: Payer: Self-pay | Admitting: Family Medicine

## 2017-10-31 ENCOUNTER — Other Ambulatory Visit: Payer: Self-pay | Admitting: Family Medicine

## 2017-10-31 DIAGNOSIS — I10 Essential (primary) hypertension: Secondary | ICD-10-CM

## 2017-10-31 MED ORDER — ATORVASTATIN CALCIUM 40 MG PO TABS: ORAL_TABLET | ORAL | 3 refills | Status: DC

## 2017-10-31 NOTE — Telephone Encounter (Signed)
Refilled atorvastatin. Will have clinic request the patient to specify which other medications she needs refilled. Recommend patient have request come through her pharmacist to simplify the process.

## 2017-12-04 ENCOUNTER — Other Ambulatory Visit: Payer: Self-pay | Admitting: Family Medicine

## 2017-12-19 ENCOUNTER — Encounter: Payer: Self-pay | Admitting: Family Medicine

## 2017-12-19 ENCOUNTER — Other Ambulatory Visit: Payer: Self-pay | Admitting: Family Medicine

## 2017-12-19 NOTE — Telephone Encounter (Signed)
Pt is calling for a refill on her Plavix. jw

## 2017-12-20 ENCOUNTER — Encounter: Payer: Self-pay | Admitting: Family Medicine

## 2017-12-21 ENCOUNTER — Other Ambulatory Visit: Payer: Self-pay | Admitting: Family Medicine

## 2017-12-21 MED ORDER — CLOPIDOGREL BISULFATE 75 MG PO TABS: 75.0000 mg | ORAL_TABLET | Freq: Every day | ORAL | 4 refills | Status: DC

## 2017-12-21 NOTE — Telephone Encounter (Signed)
Patient left message on nurse line requesting refill on generic Plavix at [facility]. Only has a couple pills left. Please address. Hubbard Hartshorn, RN, BSN

## 2017-12-21 NOTE — Telephone Encounter (Signed)
Pt needs refill on clopidogrel (Plavix). Looks like she been trying to get it for a couple of weeks now. Please advise

## 2017-12-22 ENCOUNTER — Encounter: Payer: Self-pay | Admitting: Family Medicine

## 2017-12-23 ENCOUNTER — Encounter: Payer: Self-pay | Admitting: Family Medicine

## 2017-12-23 ENCOUNTER — Emergency Department (HOSPITAL_COMMUNITY)
Admission: EM | Admit: 2017-12-23 | Discharge: 2017-12-23 | Disposition: A | Discharge status: Home / Self Care | Payer: Medicare Other | Attending: Emergency Medicine | Admitting: Emergency Medicine

## 2017-12-23 ENCOUNTER — Encounter (HOSPITAL_COMMUNITY): Payer: Self-pay | Admitting: Emergency Medicine

## 2017-12-23 ENCOUNTER — Other Ambulatory Visit: Payer: Self-pay

## 2017-12-23 DIAGNOSIS — Z9641 Presence of insulin pump (external) (internal): Secondary | ICD-10-CM | POA: Diagnosis not present

## 2017-12-23 DIAGNOSIS — Z79899 Other long term (current) drug therapy: Secondary | ICD-10-CM | POA: Insufficient documentation

## 2017-12-23 DIAGNOSIS — E039 Hypothyroidism, unspecified: Secondary | ICD-10-CM | POA: Insufficient documentation

## 2017-12-23 DIAGNOSIS — Z8673 Personal history of transient ischemic attack (TIA), and cerebral infarction without residual deficits: Secondary | ICD-10-CM | POA: Insufficient documentation

## 2017-12-23 DIAGNOSIS — E109 Type 1 diabetes mellitus without complications: Secondary | ICD-10-CM | POA: Insufficient documentation

## 2017-12-23 DIAGNOSIS — I110 Hypertensive heart disease with heart failure: Secondary | ICD-10-CM | POA: Insufficient documentation

## 2017-12-23 DIAGNOSIS — Z85850 Personal history of malignant neoplasm of thyroid: Secondary | ICD-10-CM | POA: Insufficient documentation

## 2017-12-23 DIAGNOSIS — R197 Diarrhea, unspecified: Secondary | ICD-10-CM | POA: Insufficient documentation

## 2017-12-23 DIAGNOSIS — R112 Nausea with vomiting, unspecified: Secondary | ICD-10-CM | POA: Insufficient documentation

## 2017-12-23 DIAGNOSIS — I509 Heart failure, unspecified: Secondary | ICD-10-CM | POA: Diagnosis not present

## 2017-12-23 DIAGNOSIS — R109 Unspecified abdominal pain: Secondary | ICD-10-CM | POA: Insufficient documentation

## 2017-12-23 DIAGNOSIS — Z7902 Long term (current) use of antithrombotics/antiplatelets: Secondary | ICD-10-CM | POA: Diagnosis not present

## 2017-12-23 DIAGNOSIS — R6883 Chills (without fever): Secondary | ICD-10-CM | POA: Diagnosis not present

## 2017-12-23 LAB — URINALYSIS, ROUTINE W REFLEX MICROSCOPIC
BACTERIA UA: NONE SEEN
BILIRUBIN URINE: NEGATIVE
Glucose, UA: 150 mg/dL — AB
Hgb urine dipstick: NEGATIVE
KETONES UR: NEGATIVE mg/dL
LEUKOCYTES UA: NEGATIVE
Nitrite: NEGATIVE
PH: 5 (ref 5.0–8.0)
Protein, ur: 30 mg/dL — AB
SPECIFIC GRAVITY, URINE: 1.024 (ref 1.005–1.030)

## 2017-12-23 LAB — COMPREHENSIVE METABOLIC PANEL
ALBUMIN: 3.4 g/dL — AB (ref 3.5–5.0)
ALT: 20 U/L (ref 14–54)
AST: 20 U/L (ref 15–41)
Alkaline Phosphatase: 85 U/L (ref 38–126)
Anion gap: 8 (ref 5–15)
BUN: 16 mg/dL (ref 6–20)
CHLORIDE: 101 mmol/L (ref 101–111)
CO2: 27 mmol/L (ref 22–32)
CREATININE: 1.04 mg/dL — AB (ref 0.44–1.00)
Calcium: 9.3 mg/dL (ref 8.9–10.3)
GFR calc Af Amer: >60 mL/min (ref >60)
GFR, EST NON AFRICAN AMERICAN: 55 mL/min — AB (ref >60)
GLUCOSE: 278 mg/dL — AB (ref 65–99)
POTASSIUM: 4.4 mmol/L (ref 3.5–5.1)
SODIUM: 136 mmol/L (ref 135–145)
Total Bilirubin: 1 mg/dL (ref 0.3–1.2)
Total Protein: 7.3 g/dL (ref 6.5–8.1)

## 2017-12-23 LAB — CBC
HEMATOCRIT: 39.3 % (ref 36.0–46.0)
Hemoglobin: 12.6 g/dL (ref 12.0–15.0)
MCH: 28.2 pg (ref 26.0–34.0)
MCHC: 32.1 g/dL (ref 30.0–36.0)
MCV: 87.9 fL (ref 78.0–100.0)
PLATELETS: 217 10*3/uL (ref 150–400)
RBC: 4.47 MIL/uL (ref 3.87–5.11)
RDW: 12.7 % (ref 11.5–15.5)
WBC: 7.8 10*3/uL (ref 4.0–10.5)

## 2017-12-23 LAB — LIPASE, BLOOD: LIPASE: 16 U/L (ref 11–51)

## 2017-12-23 MED ORDER — ONDANSETRON 4 MG PO TBDP: 4.0000 mg | ORAL_TABLET | Freq: Three times a day (TID) | ORAL | 0 refills | Status: DC | PRN

## 2017-12-23 MED ORDER — ONDANSETRON 4 MG PO TBDP
8.0000 mg | ORAL_TABLET | Freq: Once | ORAL | Status: AC
  Administered 2017-12-23: 8 mg via ORAL
  Filled 2017-12-23: qty 2

## 2017-12-23 NOTE — ED Triage Notes (Signed)
Pt. Stated, I started this morning with diarrhea and throwing up started this morning

## 2017-12-23 NOTE — Discharge Instructions (Signed)
Please drink plenty of fluids Take zofran for nausea Start Imodium if your diarrhea continues more than 2 days Return if you are worsening

## 2017-12-23 NOTE — ED Notes (Signed)
Pt states she understands instructions. Home stable with family\. 

## 2017-12-23 NOTE — ED Provider Notes (Signed)
[facility] EMERGENCY DEPARTMENT Provider Note   CSN: [id number] Arrival date & time: 12/23/17  0845     History   Chief Complaint Chief Complaint  Patient presents with  . Diarrhea  . Emesis    HPI [name] is a 66 y.o. female who presents with N/V/D. PMH significant for CHF EF 55%, HTN, HLD, IDDM, hx of CVA. She states that her symptoms started acutely at 3AM this morning. She went to the bathroom and started having non-bloody diarrhea and vomiting. She's had 6 episodes of diarrhea and 2 of vomiting thus far. She also endorses chills and mild abdominal cramping. No fever, lightheadedness, fatigue, chest pain, SOB, urinary symptoms. She denies any known sick contacts, recent travel, recent antibiotic use. She states she came because last time this happened she was hospitalized for dehydration and she wanted to have her symptoms treated before she became more sick.  HPI  Past Medical History:  Diagnosis Date  . Anemia   . Arthritis   . CHF (congestive heart failure) ([place])   . Depression   . GERD (gastroesophageal reflux disease)   . Hemorrhoids   . Hyperlipidemia   . Hypertension   . Hypothyroidism   . Seasonal allergies   . Stroke ([place])   . Thyroid cancer ([place])    per pt S/p Total Thyroidectomy with Radioactive Iodine Therapy  . Type 1 diabetes mellitus [facility])     Patient Active Problem List   Diagnosis Date Noted  . Diarrhea 10/15/2017  . Dehydration   . Frequent PVCs   . Hypokalemia   . Controlled type 1 diabetes mellitus with hyperglycemia, with long-term current use of insulin ([place])   . Hypomagnesemia   . BPPV (benign paroxysmal positional vertigo), right 06/13/2017  . Premature atrial beats 06/13/2017  . Snoring 07/31/2016  . Pre-ulcerative corn or callous 12/15/2015  . History of CHF (congestive heart failure) 10/29/2015  . Essential hypertension 10/29/2015  . Cerebrovascular accident (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  ([place]) 10/29/2015  . Toe injury 09/24/2015  . Thyroid activity decreased   . Intracranial vascular stenosis   . HLD (hyperlipidemia)   . Cerebral infarction due to thrombosis of basilar artery ([place])   . CVA (cerebral vascular accident) ([place]) 08/29/2015  . Preventative health care 09/30/2014  . Special screening for malignant neoplasms, colon 09/10/2014  . Great toe pain 07/09/2014  . Posterior tibial tendinitis of right leg 04/26/2014  . Complicated UTI (urinary tract infection) 12/24/2013  . Preventive measure 08/20/2013  . Morbid obesity ([place]) 05/29/2013  . Back pain without radiation 04/28/2013  . Nail dystrophy 01/10/2013  . Diabetes mellitus type I ([place]) 01/25/2012  . Hyperlipidemia 01/25/2012  . Hypothyroidism 01/25/2012  . Hypertension 01/25/2012  . GERD (gastroesophageal reflux disease) 01/25/2012    Past Surgical History:  Procedure Laterality Date  . ABDOMINAL ANGIOGRAM N/A 05/14/2012   Procedure: ABDOMINAL ANGIOGRAM;  Surgeon: Laverda Page, MD;  Location: [facility] CATH LAB;  Service: Cardiovascular;  Laterality: N/A;  . ABDOMINAL HYSTERECTOMY  1995   partial  . ANKLE FRACTURE SURGERY Right   . CARDIAC CATHETERIZATION     with coronary angiogram  . COLONOSCOPY N/A 09/09/2014   Procedure: COLONOSCOPY;  Surgeon: Gatha Mayer, MD;  Location: [place];  Service: Endoscopy;  Laterality: N/A;  . LEFT AND RIGHT HEART CATHETERIZATION WITH CORONARY ANGIOGRAM N/A 05/14/2012   Procedure: LEFT AND RIGHT HEART CATHETERIZATION WITH CORONARY ANGIOGRAM;  Surgeon: Laverda Page, MD;  Location:  [place] CATH LAB;  Service: Cardiovascular;  Laterality: N/A;  . MINI METER   09/08/2014   ELECTRONIC INSULIN PUMP  . THYROIDECTOMY  2006    OB History    No data available       Home Medications    Prior to Admission medications   Medication Sig Start Date End Date Taking? Authorizing Provider  acetaminophen (TYLENOL) 500 MG tablet Take 1-2 tablets (500-1,000 mg total) by mouth every  8 (eight) hours as needed for mild pain or moderate pain. 09/02/15   Leeanne Rio, MD  atorvastatin (LIPITOR) 40 MG tablet TAKE 1 TABLET(40 MG) BY MOUTH DAILY 10/31/17   Bonnita Hollow, MD  clopidogrel (PLAVIX) 75 MG tablet Take 1 tablet (75 mg total) by mouth daily. 12/21/17   Bonnita Hollow, MD  fexofenadine (ALLEGRA) 30 MG tablet Take 30 mg by mouth daily as needed (allergies).     [provider]  Insulin Human (INSULIN PUMP) SOLN Inject 10 each into the skin daily as needed (insulin levels.). humalog insulin Checks sugar four times daily, can use up to 10 units.    [provider]  levothyroxine (SYNTHROID, LEVOTHROID) 175 MCG tablet Take 175 mcg by mouth daily. 06/06/17   [provider]  losartan-hydrochlorothiazide (HYZAAR) 100-12.5 MG tablet TAKE 1 TABLET BY MOUTH EVERY DAY 11/02/17   Bonnita Hollow, MD  magnesium 30 MG tablet Take 1 tablet (30 mg total) by mouth 2 (two) times daily. 10/19/17 01/17/18  Bonnita Hollow, MD  Multiple Vitamin (MULTIVITAMIN WITH MINERALS) TABS tablet Take 1 tablet by mouth daily.    [provider]  POT BICARB-POT CHLORIDE,25MEQ, 25 MEQ TBEF Take 1 tablet (25 mEq total) by mouth daily. 10/19/17 01/17/18  Bonnita Hollow, MD  ranitidine (ZANTAC) 150 MG tablet Take 1 tablet (150 mg total) by mouth 2 (two) times daily. 10/19/17   Bonnita Hollow, MD    Family History Family History  Problem Relation Age of Onset  . Heart disease Mother        MI  . Hyperlipidemia Mother   . Hypertension Mother   . Renal Disease Mother        insufficiency  . Early death Father        Head Injury at Work  . Hyperlipidemia Father   . Hypertension Father   . Stroke Son   . Diabetes Maternal Aunt   . Prostate cancer Paternal Uncle   . Heart disease Maternal Aunt   . Breast cancer Neg Hx     Social History Social History   Tobacco Use  . Smoking status: Never Smoker  . Smokeless tobacco: Never Used  Substance  Use Topics  . Alcohol use: No  . Drug use: No     Allergies   Patient has no known allergies.   Review of Systems Review of Systems  Constitutional: Positive for chills. Negative for fever.  Respiratory: Negative for shortness of breath.   Cardiovascular: Negative for chest pain.  Gastrointestinal: Positive for abdominal pain (cramping), diarrhea, nausea and vomiting.  Genitourinary: Negative for dysuria and flank pain.  Neurological: Negative for syncope, weakness and light-headedness.  All other systems reviewed and are negative.    Physical Exam Updated Vital Signs BP 111/60 (BP Location: Right Arm)   Pulse 90   Temp 98.6 F (37 C) (Oral)   Resp 19   Ht 5\' 7"  (1.702 m)   Wt 113.4 kg (250 lb)   SpO2 99%  BMI 39.16 kg/m   Physical Exam  Constitutional: She is oriented to person, place, and time. She appears well-developed and well-nourished. No distress.  Calm, cooperative, well-appearing female in NAD  HENT:  Head: Normocephalic and atraumatic.  Eyes: Conjunctivae are normal. Pupils are equal, round, and reactive to light. Right eye exhibits no discharge. Left eye exhibits no discharge. No scleral icterus.  Neck: Normal range of motion.  Cardiovascular: Normal rate and regular rhythm. Exam reveals no gallop and no friction rub.  No murmur heard. Occaisional extra beats  Pulmonary/Chest: Effort normal and breath sounds normal. No stridor. No respiratory distress. She has no wheezes. She has no rales. She exhibits no tenderness.  Abdominal: Soft. Bowel sounds are normal. She exhibits no distension. There is no tenderness.  Insulin pump in place  Neurological: She is alert and oriented to person, place, and time.  Skin: Skin is warm and dry.  Psychiatric: She has a normal mood and affect. Her behavior is normal.  Nursing note and vitals reviewed.    ED Treatments / Results  Labs (all labs ordered are listed, but only abnormal results are displayed) Labs  Reviewed  COMPREHENSIVE METABOLIC PANEL - Abnormal; Notable for the following components:      Result Value   Glucose, Bld 278 (*)    Creatinine, Ser 1.04 (*)    Albumin 3.4 (*)    GFR calc non Af Amer 55 (*)    All other components within normal limits  URINALYSIS, ROUTINE W REFLEX MICROSCOPIC - Abnormal; Notable for the following components:   Glucose, UA 150 (*)    Protein, ur 30 (*)    Squamous Epithelial / LPF 0-5 (*)    All other components within normal limits  LIPASE, BLOOD  CBC    EKG  EKG Interpretation None       Radiology No results found.  Procedures Procedures (including critical care time)  Medications Ordered in ED Medications  ondansetron (ZOFRAN-ODT) disintegrating tablet 8 mg (8 mg Oral Given 12/23/17 1352)     Initial Impression / Assessment and Plan / ED Course  I have reviewed the triage vital signs and the nursing notes.  Pertinent labs & imaging results that were available during my care of the patient were reviewed by me and considered in my medical decision making (see chart for details).  66 year old female with nausea vomiting and diarrhea consistent with viral gastroenteritis.  Vital signs are normal.  She is well-appearing.  Abdomen is nontender.  CBC is normal.  CMP is remarkable for hyperglycemia and mildly elevated serum creatinine (1.04).  UA is remarkable for 150 glucose and 30 protein.  Patient was given the option of rehydration with IV fluids versus p.o. medication and oral rehydration.  Patient feels that she can orally rehydrate if she is given nausea medication and declines IV fluids at this time.   2:23 PM She tolerated PO and feels better after Zofran. Will d/c with strict return precautions  Final Clinical Impressions(s) / ED Diagnoses   Final diagnoses:  Nausea vomiting and diarrhea    ED Discharge Orders    None       Recardo Evangelist, PA-C 12/23/17 1423    [name], MD 12/24/17 [phone]

## 2017-12-23 NOTE — ED Notes (Signed)
Pt taking po fluids and tolerating well. 

## 2017-12-24 ENCOUNTER — Other Ambulatory Visit: Payer: Self-pay

## 2017-12-24 ENCOUNTER — Encounter (HOSPITAL_COMMUNITY): Payer: Self-pay | Admitting: Emergency Medicine

## 2017-12-24 ENCOUNTER — Encounter: Payer: Self-pay | Admitting: Family Medicine

## 2017-12-24 DIAGNOSIS — K529 Noninfective gastroenteritis and colitis, unspecified: Secondary | ICD-10-CM | POA: Diagnosis not present

## 2017-12-24 DIAGNOSIS — I110 Hypertensive heart disease with heart failure: Secondary | ICD-10-CM | POA: Insufficient documentation

## 2017-12-24 DIAGNOSIS — I509 Heart failure, unspecified: Secondary | ICD-10-CM | POA: Insufficient documentation

## 2017-12-24 DIAGNOSIS — Z9641 Presence of insulin pump (external) (internal): Secondary | ICD-10-CM | POA: Insufficient documentation

## 2017-12-24 DIAGNOSIS — Z7902 Long term (current) use of antithrombotics/antiplatelets: Secondary | ICD-10-CM | POA: Insufficient documentation

## 2017-12-24 DIAGNOSIS — E039 Hypothyroidism, unspecified: Secondary | ICD-10-CM | POA: Insufficient documentation

## 2017-12-24 DIAGNOSIS — R1110 Vomiting, unspecified: Secondary | ICD-10-CM | POA: Diagnosis present

## 2017-12-24 DIAGNOSIS — E109 Type 1 diabetes mellitus without complications: Secondary | ICD-10-CM | POA: Insufficient documentation

## 2017-12-24 DIAGNOSIS — Z8673 Personal history of transient ischemic attack (TIA), and cerebral infarction without residual deficits: Secondary | ICD-10-CM | POA: Diagnosis not present

## 2017-12-24 DIAGNOSIS — Z79899 Other long term (current) drug therapy: Secondary | ICD-10-CM | POA: Insufficient documentation

## 2017-12-24 LAB — COMPREHENSIVE METABOLIC PANEL
ALBUMIN: 3.2 g/dL — AB (ref 3.5–5.0)
ALK PHOS: 70 U/L (ref 38–126)
ALT: 16 U/L (ref 14–54)
ANION GAP: 11 (ref 5–15)
AST: 14 U/L — ABNORMAL LOW (ref 15–41)
BUN: 18 mg/dL (ref 6–20)
CALCIUM: 8.4 mg/dL — AB (ref 8.9–10.3)
CO2: 24 mmol/L (ref 22–32)
Chloride: 100 mmol/L — ABNORMAL LOW (ref 101–111)
Creatinine, Ser: 1.35 mg/dL — ABNORMAL HIGH (ref 0.44–1.00)
GFR calc non Af Amer: 40 mL/min — ABNORMAL LOW (ref >60)
GFR, EST AFRICAN AMERICAN: 46 mL/min — AB (ref >60)
GLUCOSE: 205 mg/dL — AB (ref 65–99)
POTASSIUM: 3.3 mmol/L — AB (ref 3.5–5.1)
SODIUM: 135 mmol/L (ref 135–145)
Total Bilirubin: 0.7 mg/dL (ref 0.3–1.2)
Total Protein: 6.8 g/dL (ref 6.5–8.1)

## 2017-12-24 LAB — CBC WITH DIFFERENTIAL/PLATELET
BASOS PCT: 0 %
Basophils Absolute: 0 10*3/uL (ref 0.0–0.1)
EOS ABS: 0 10*3/uL (ref 0.0–0.7)
EOS PCT: 0 %
HCT: 36.1 % (ref 36.0–46.0)
Hemoglobin: 11.6 g/dL — ABNORMAL LOW (ref 12.0–15.0)
LYMPHS ABS: 1.2 10*3/uL (ref 0.7–4.0)
Lymphocytes Relative: 23 %
MCH: 28.2 pg (ref 26.0–34.0)
MCHC: 32.1 g/dL (ref 30.0–36.0)
MCV: 87.6 fL (ref 78.0–100.0)
MONO ABS: 0.6 10*3/uL (ref 0.1–1.0)
MONOS PCT: 11 %
NEUTROS PCT: 66 %
Neutro Abs: 3.4 10*3/uL (ref 1.7–7.7)
PLATELETS: 211 10*3/uL (ref 150–400)
RBC: 4.12 MIL/uL (ref 3.87–5.11)
RDW: 12.9 % (ref 11.5–15.5)
WBC: 5.2 10*3/uL (ref 4.0–10.5)

## 2017-12-24 LAB — CBG MONITORING, ED
GLUCOSE-CAPILLARY: 127 mg/dL — AB (ref 65–99)
Glucose-Capillary: 166 mg/dL — ABNORMAL HIGH (ref 65–99)
Glucose-Capillary: 201 mg/dL — ABNORMAL HIGH (ref 65–99)

## 2017-12-24 NOTE — ED Notes (Signed)
Pt states she had an accident, provided brief/mesh underwear to change into

## 2017-12-24 NOTE — ED Notes (Signed)
Pt's CBG result was 201. Informed Bobby - RN.

## 2017-12-24 NOTE — ED Triage Notes (Signed)
Pt reports she was seen here yesterday for vomiting, was given zofran which she states helped her vomiting but decreased her appetite and then she began having low blood sugar at home today, reports lowest cbg was 80, has T1DM.

## 2017-12-25 ENCOUNTER — Encounter: Payer: Self-pay | Admitting: Family Medicine

## 2017-12-25 ENCOUNTER — Emergency Department (HOSPITAL_COMMUNITY)
Admission: EM | Admit: 2017-12-25 | Discharge: 2017-12-25 | Disposition: A | Discharge status: Home / Self Care | Payer: Medicare Other | Attending: Emergency Medicine | Admitting: Emergency Medicine

## 2017-12-25 DIAGNOSIS — K529 Noninfective gastroenteritis and colitis, unspecified: Secondary | ICD-10-CM

## 2017-12-25 LAB — CBG MONITORING, ED
GLUCOSE-CAPILLARY: 182 mg/dL — AB (ref 65–99)
Glucose-Capillary: 129 mg/dL — ABNORMAL HIGH (ref 65–99)
Glucose-Capillary: 154 mg/dL — ABNORMAL HIGH (ref 65–99)
Glucose-Capillary: 168 mg/dL — ABNORMAL HIGH (ref 65–99)
Glucose-Capillary: 175 mg/dL — ABNORMAL HIGH (ref 65–99)
Glucose-Capillary: 193 mg/dL — ABNORMAL HIGH (ref 65–99)

## 2017-12-25 LAB — LIPASE, BLOOD: Lipase: 17 U/L (ref 11–51)

## 2017-12-25 MED ORDER — DIPHENOXYLATE-ATROPINE 2.5-0.025 MG PO TABS
1.0000 | ORAL_TABLET | Freq: Once | ORAL | Status: AC
  Administered 2017-12-25: 1 via ORAL
  Filled 2017-12-25: qty 1

## 2017-12-25 MED ORDER — DIPHENOXYLATE-ATROPINE 2.5-0.025 MG PO TABS: 1.0000 | ORAL_TABLET | Freq: Four times a day (QID) | ORAL | 0 refills | Status: DC | PRN

## 2017-12-25 MED ORDER — PROMETHAZINE HCL 25 MG PO TABS: 25.0000 mg | ORAL_TABLET | Freq: Three times a day (TID) | ORAL | 0 refills | Status: DC | PRN

## 2017-12-25 MED ORDER — SODIUM CHLORIDE 0.9 % IV BOLUS (SEPSIS)
1000.0000 mL | Freq: Once | INTRAVENOUS | Status: AC
  Administered 2017-12-25: 1000 mL via INTRAVENOUS

## 2017-12-25 NOTE — ED Provider Notes (Signed)
[facility] EMERGENCY DEPARTMENT Provider Note   CSN: [id number] Arrival date & time: 12/24/17  1741     History   Chief Complaint Chief Complaint  Patient presents with  . Hypoglycemia  . Emesis    HPI [name] is a 67 y.o. female.  HPI Patient presents to the emergency department with 2 episodes of vomiting.  She also has had multiple episodes of diarrhea.  Patient states that she took 1 dose of Imodium without relief of her symptoms.  The patient states she did not take any other medications prior to arrival.  She states that she only had one episode of vomiting today she was seen yesterday for similar symptoms.  Patient states that she took Zofran which she felt like made her symptoms worse.  The patient denies chest pain, shortness of breath, headache,blurred vision, neck pain, fever, cough, weakness, numbness, dizziness, anorexia, edema, abdominal pain,rash, back pain, dysuria, hematemesis, bloody stool, near syncope, or syncope. Past Medical History:  Diagnosis Date  . Anemia   . Arthritis   . CHF (congestive heart failure) ([place])   . Depression   . GERD (gastroesophageal reflux disease)   . Hemorrhoids   . Hyperlipidemia   . Hypertension   . Hypothyroidism   . Seasonal allergies   . Stroke ([place])   . Thyroid cancer ([place])    per pt S/p Total Thyroidectomy with Radioactive Iodine Therapy  . Type 1 diabetes mellitus [facility])     Patient Active Problem List   Diagnosis Date Noted  . Diarrhea 10/15/2017  . Dehydration   . Frequent PVCs   . Hypokalemia   . Controlled type 1 diabetes mellitus with hyperglycemia, with long-term current use of insulin ([place])   . Hypomagnesemia   . BPPV (benign paroxysmal positional vertigo), right 06/13/2017  . Premature atrial beats 06/13/2017  . Snoring 07/31/2016  . Pre-ulcerative corn or callous 12/15/2015  . History of CHF (congestive heart failure) 10/29/2015  . Essential hypertension 10/29/2015  .  Cerebrovascular accident (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 ([place]) 10/29/2015  . Toe injury 09/24/2015  . Thyroid activity decreased   . Intracranial vascular stenosis   . HLD (hyperlipidemia)   . Cerebral infarction due to thrombosis of basilar artery ([place])   . CVA (cerebral vascular accident) ([place]) 08/29/2015  . Preventative health care 09/30/2014  . Special screening for malignant neoplasms, colon 09/10/2014  . Great toe pain 07/09/2014  . Posterior tibial tendinitis of right leg 04/26/2014  . Complicated UTI (urinary tract infection) 12/24/2013  . Preventive measure 08/20/2013  . Morbid obesity ([place]) 05/29/2013  . Back pain without radiation 04/28/2013  . Nail dystrophy 01/10/2013  . Diabetes mellitus type I ([place]) 01/25/2012  . Hyperlipidemia 01/25/2012  . Hypothyroidism 01/25/2012  . Hypertension 01/25/2012  . GERD (gastroesophageal reflux disease) 01/25/2012    Past Surgical History:  Procedure Laterality Date  . ABDOMINAL ANGIOGRAM N/A 05/14/2012   Procedure: ABDOMINAL ANGIOGRAM;  Surgeon: Laverda Page, MD;  Location: [facility] CATH LAB;  Service: Cardiovascular;  Laterality: N/A;  . ABDOMINAL HYSTERECTOMY  1995   partial  . ANKLE FRACTURE SURGERY Right   . CARDIAC CATHETERIZATION     with coronary angiogram  . COLONOSCOPY N/A 09/09/2014   Procedure: COLONOSCOPY;  Surgeon: Gatha Mayer, MD;  Location: [place];  Service: Endoscopy;  Laterality: N/A;  . LEFT AND RIGHT HEART CATHETERIZATION WITH CORONARY ANGIOGRAM N/A 05/14/2012   Procedure: LEFT AND RIGHT HEART CATHETERIZATION WITH CORONARY ANGIOGRAM;  Surgeon: Laverda Page, MD;  Location: [facility] CATH LAB;  Service: Cardiovascular;  Laterality: N/A;  . MINI METER   09/08/2014   ELECTRONIC INSULIN PUMP  . THYROIDECTOMY  2006    OB History    No data available       Home Medications    Prior to Admission medications   Medication Sig Start Date End Date Taking? Authorizing Provider  acetaminophen (TYLENOL)  500 MG tablet Take 1-2 tablets (500-1,000 mg total) by mouth every 8 (eight) hours as needed for mild pain or moderate pain. 09/02/15  Yes Leeanne Rio, MD  atorvastatin (LIPITOR) 40 MG tablet TAKE 1 TABLET(40 MG) BY MOUTH DAILY 10/31/17  Yes Bonnita Hollow, MD  clopidogrel (PLAVIX) 75 MG tablet Take 1 tablet (75 mg total) by mouth daily. 12/21/17  Yes Bonnita Hollow, MD  fexofenadine (ALLEGRA) 30 MG tablet Take 30 mg by mouth daily as needed (allergies).    Yes [provider]  Insulin Human (INSULIN PUMP) SOLN Inject 10 each into the skin daily as needed (insulin levels.). humalog insulin Checks sugar four times daily, can use up to 10 units.   Yes [provider]  levothyroxine (SYNTHROID, LEVOTHROID) 175 MCG tablet Take 175 mcg by mouth daily. 06/06/17  Yes [provider]  losartan-hydrochlorothiazide (HYZAAR) 100-12.5 MG tablet TAKE 1 TABLET BY MOUTH EVERY DAY 11/02/17  Yes Bonnita Hollow, MD  ondansetron (ZOFRAN ODT) 4 MG disintegrating tablet Take 1 tablet (4 mg total) by mouth every 8 (eight) hours as needed for nausea or vomiting. 12/23/17  Yes Recardo Evangelist, PA-C  POT BICARB-POT CHLORIDE,25MEQ, 25 MEQ TBEF Take 1 tablet (25 mEq total) by mouth daily. 10/19/17 01/17/18 Yes Bonnita Hollow, MD  ranitidine (ZANTAC) 150 MG tablet Take 1 tablet (150 mg total) by mouth 2 (two) times daily. 10/19/17  Yes Bonnita Hollow, MD  magnesium 30 MG tablet Take 1 tablet (30 mg total) by mouth 2 (two) times daily. Patient not taking: Reported on 12/25/2017 10/19/17 01/17/18  Bonnita Hollow, MD    Family History Family History  Problem Relation Age of Onset  . Heart disease Mother        MI  . Hyperlipidemia Mother   . Hypertension Mother   . Renal Disease Mother        insufficiency  . Early death Father        Head Injury at Work  . Hyperlipidemia Father   . Hypertension Father   . Stroke Son   . Diabetes Maternal Aunt   . Prostate cancer  Paternal Uncle   . Heart disease Maternal Aunt   . Breast cancer Neg Hx     Social History Social History   Tobacco Use  . Smoking status: Never Smoker  . Smokeless tobacco: Never Used  Substance Use Topics  . Alcohol use: No  . Drug use: No     Allergies   Patient has no known allergies.   Review of Systems Review of Systems All other systems negative except as documented in the HPI. All pertinent positives and negatives as reviewed in the HPI.  Physical Exam Updated Vital Signs BP (!) 108/48   Pulse 90   Temp 99 F (37.2 C) (Oral)   Resp 20   SpO2 94%   Physical Exam  Constitutional: She is oriented to person, place, and time. She appears well-developed and well-nourished. No distress.  HENT:  Head: Normocephalic and atraumatic.  Mouth/Throat: Oropharynx is  clear and moist.  Eyes: Pupils are equal, round, and reactive to light.  Neck: Normal range of motion. Neck supple.  Cardiovascular: Normal rate, regular rhythm and normal heart sounds. Exam reveals no gallop and no friction rub.  No murmur heard. Pulmonary/Chest: Effort normal and breath sounds normal. No respiratory distress. She has no wheezes.  Abdominal: Soft. Bowel sounds are normal. She exhibits no distension. There is no tenderness. There is no rebound and no guarding.  Neurological: She is alert and oriented to person, place, and time. She exhibits normal muscle tone. Coordination normal.  Skin: Skin is warm and dry. Capillary refill takes less than 2 seconds. No rash noted. No erythema.  Psychiatric: She has a normal mood and affect. Her behavior is normal.  Nursing note and vitals reviewed.    ED Treatments / Results  Labs (all labs ordered are listed, but only abnormal results are displayed) Labs Reviewed  CBC WITH DIFFERENTIAL/PLATELET - Abnormal; Notable for the following components:      Result Value   Hemoglobin 11.6 (*)    All other components within normal limits  COMPREHENSIVE  METABOLIC PANEL - Abnormal; Notable for the following components:   Potassium 3.3 (*)    Chloride 100 (*)    Glucose, Bld 205 (*)    Creatinine, Ser 1.35 (*)    Calcium 8.4 (*)    Albumin 3.2 (*)    AST 14 (*)    GFR calc non Af Amer 40 (*)    GFR calc Af Amer 46 (*)    All other components within normal limits  CBG MONITORING, ED - Abnormal; Notable for the following components:   Glucose-Capillary 127 (*)    All other components within normal limits  CBG MONITORING, ED - Abnormal; Notable for the following components:   Glucose-Capillary 166 (*)    All other components within normal limits  CBG MONITORING, ED - Abnormal; Notable for the following components:   Glucose-Capillary 201 (*)    All other components within normal limits  CBG MONITORING, ED - Abnormal; Notable for the following components:   Glucose-Capillary 193 (*)    All other components within normal limits  CBG MONITORING, ED - Abnormal; Notable for the following components:   Glucose-Capillary 175 (*)    All other components within normal limits  CBG MONITORING, ED - Abnormal; Notable for the following components:   Glucose-Capillary 182 (*)    All other components within normal limits  CBG MONITORING, ED - Abnormal; Notable for the following components:   Glucose-Capillary 154 (*)    All other components within normal limits  CBG MONITORING, ED - Abnormal; Notable for the following components:   Glucose-Capillary 168 (*)    All other components within normal limits  CBG MONITORING, ED - Abnormal; Notable for the following components:   Glucose-Capillary 129 (*)    All other components within normal limits  LIPASE, BLOOD    EKG  EKG Interpretation None       Radiology No results found.  Procedures Procedures (including critical care time)  Medications Ordered in ED Medications  sodium chloride 0.9 % bolus 1,000 mL (1,000 mLs Intravenous New Bag/Given 12/25/17 0303)  diphenoxylate-atropine  (LOMOTIL) 2.5-0.025 MG per tablet 1 tablet (1 tablet Oral Given 12/25/17 0541)     Initial Impression / Assessment and Plan / ED Course  I have reviewed the triage vital signs and the nursing notes.  Pertinent labs & imaging results that were available during my care  of the patient were reviewed by me and considered in my medical decision making (see chart for details).     Patient was treated with IV fluids along with Lomotil.  The patient most likely has gastroenteritis which her symptoms started yesterday.  The patient states that she is concerned that she may go home and have further diarrhea.  The patient seeks admission but I advised her that based off her laboratory testing and other test along with her vital signs that she does not meet admission criteria at this point.  We will give her symptomatic relief.  Told to return here as needed I did advise her to follow-up with her primary doctor.  Final Clinical Impressions(s) / ED Diagnoses   Final diagnoses:  None    ED Discharge Orders    None       Dalia Heading, PA-C 12/25/17 [place], Delice Bison, DO 12/25/17 [phone]

## 2017-12-25 NOTE — Discharge Instructions (Signed)
Return here as needed follow-up with your primary doctor.  Slowly increase your fluid intake.  Rest as much as possible.

## 2017-12-25 NOTE — ED Notes (Signed)
Patient was given OJ at discharge at her request for CBG of 129-Monique,RN

## 2017-12-26 ENCOUNTER — Other Ambulatory Visit: Payer: Self-pay | Admitting: Family Medicine

## 2017-12-26 ENCOUNTER — Encounter: Payer: Self-pay | Admitting: Internal Medicine

## 2017-12-26 ENCOUNTER — Ambulatory Visit: Payer: Medicare Other | Admitting: Internal Medicine

## 2017-12-26 ENCOUNTER — Encounter: Payer: Self-pay | Admitting: Family Medicine

## 2017-12-26 ENCOUNTER — Other Ambulatory Visit: Payer: Self-pay

## 2017-12-26 DIAGNOSIS — R112 Nausea with vomiting, unspecified: Secondary | ICD-10-CM | POA: Diagnosis not present

## 2017-12-26 MED ORDER — CLOPIDOGREL BISULFATE 75 MG PO TABS: 75.0000 mg | ORAL_TABLET | Freq: Every day | ORAL | 3 refills | Status: DC

## 2017-12-26 MED ORDER — ONDANSETRON 4 MG PO TBDP: 4.0000 mg | ORAL_TABLET | Freq: Three times a day (TID) | ORAL | 0 refills | Status: DC | PRN

## 2017-12-26 NOTE — Progress Notes (Signed)
Will refill pt clopidogrel for 1 year and zofran for 1 week for abdominal sx. Pt should come in if sx persist longer than this week. I have to call in b/c e-rx is not working. Called pt to inform them of refill.

## 2017-12-26 NOTE — Assessment & Plan Note (Signed)
Improved. Well-appearing and well-hydrated on exam today. Agree with ED diagnosis of viral gastroenteritis, given patient's unremarkable labwork and physical exam, as well as improvement with IVF, Lomotil, and phenergan. Discussed with patient that this is likely not going to become a frequent occurrence in her life, as she appears to have simply contracted a stomach virus that will resolve with time. Discussed that prescribing a "preventive medicine" is neither possible not indicated. Discussed that if for some reason patient does develop nausea and vomiting again, it is important to stay hydrated. Also discussed calling the after hours line if clinic is not open, but stressed that this does not mean she will be seen immediately or will not have to go to the emergency room or urgent care if physician on call feels that is best course of action. Patient remained agitated and asked to speak with clinic administration.

## 2017-12-26 NOTE — Progress Notes (Signed)
   Subjective:   Patient: [name]       Birthdate: [birth year]       MRN: [id number]      HPI  Rosetta Jerilynn Mages Zandi is a 67 y.o. female presenting for same day appt for nausea and vomiting.   Nausea/vomiting Seen in ED for this issue yesterday; was seen for same issue two days prior. Diagnosed with viral gastroenteritis. Given Lomotil and phenergan. Said that symptoms started improving around 3AM. Appetite has not returned to normal however. Patient is now convinced that since this has happened twice this is going to be a common occurrence for her, and she wants "preventive medicine" to prevent future episodes of nausea and vomiting.   Of note, patient very agitated regarding care she received at emergency room and at [facility]. Is angry that we are not open 24 hours a day 7 days a week, and that the MyChart message she sent when clinic was closed was not immediately responded to. Says she was not aware of After Hours Line which is why she has never called that number. Is mad that despite having a PCP she has to go to emergency room if she has an issue when we are not open. Thinks that because she has insurance this should not have to be the case. Also angry that she had to wait in ED for 13 hours and that a patient who was having a heart attack was seen ahead of her.   Smoking status reviewed. Patient is never smoker.   Review of Systems See HPI.     Objective:  Physical Exam  Constitutional: She is oriented to person, place, and time and well-developed, well-nourished, and in no distress.  HENT:  Head: Normocephalic and atraumatic.  Mouth/Throat: Oropharynx is clear and moist.  Eyes: Conjunctivae and EOM are normal. Right eye exhibits no discharge. Left eye exhibits no discharge.  Cardiovascular: Normal rate.  Pulmonary/Chest: Effort normal. No respiratory distress.  Neurological: She is alert and oriented to person, place, and time.  Psychiatric:  Agitated      Assessment & Plan:    Nausea with vomiting Improved. Well-appearing and well-hydrated on exam today. Agree with ED diagnosis of viral gastroenteritis, given patient's unremarkable labwork and physical exam, as well as improvement with IVF, Lomotil, and phenergan. Discussed with patient that this is likely not going to become a frequent occurrence in her life, as she appears to have simply contracted a stomach virus that will resolve with time. Discussed that prescribing a "preventive medicine" is neither possible not indicated. Discussed that if for some reason patient does develop nausea and vomiting again, it is important to stay hydrated. Also discussed calling the after hours line if clinic is not open, but stressed that this does not mean she will be seen immediately or will not have to go to the emergency room or urgent care if physician on call feels that is best course of action. Patient remained agitated and asked to speak with clinic administration.   Adin Hector, MD, MPH PGY-3 [facility] Pager [phone]

## 2018-01-01 ENCOUNTER — Ambulatory Visit: Payer: Medicare Other | Admitting: Family Medicine

## 2018-01-28 ENCOUNTER — Telehealth: Payer: Self-pay | Admitting: Internal Medicine

## 2018-01-28 ENCOUNTER — Encounter: Payer: Self-pay | Admitting: Family Medicine

## 2018-01-28 DIAGNOSIS — I10 Essential (primary) hypertension: Secondary | ICD-10-CM

## 2018-01-28 MED ORDER — LOSARTAN POTASSIUM-HCTZ 100-12.5 MG PO TABS: 1.0000 | ORAL_TABLET | Freq: Every day | ORAL | 0 refills | Status: DC

## 2018-01-28 NOTE — Telephone Encounter (Signed)
[facility] After Hours Telephone Line   Number received via page: [phone] Person calling: Sarita Haver Reason for call: Patient needs a refill on her Hyzaar as it has been recalled.   Kerrin Mo PGY-3 [place]

## 2018-01-30 MED ORDER — LOSARTAN POTASSIUM-HCTZ 100-12.5 MG PO TABS: 1.0000 | ORAL_TABLET | Freq: Every day | ORAL | 0 refills | Status: DC

## 2018-03-06 ENCOUNTER — Encounter: Payer: Medicare Other | Admitting: *Deleted

## 2018-03-06 DIAGNOSIS — Z006 Encounter for examination for normal comparison and control in clinical research program: Secondary | ICD-10-CM

## 2018-03-06 NOTE — Progress Notes (Signed)
STROKE-AF research study Month 30 follow up visit completed. Patient was hospitalized 10/16/17 and PAC's were detected, no other arrhythmias noted. No other adverse events. Losartan/HCTZ 100/12.5 mg started during that admission. Her next research required visit is due 09/Aug/2019-04/Oct/2019. Patient verbalized understanding and would like to still come into the research office.

## 2018-03-07 ENCOUNTER — Encounter: Payer: Self-pay | Admitting: Family Medicine

## 2018-03-12 ENCOUNTER — Encounter: Payer: Self-pay | Admitting: Family Medicine

## 2018-03-20 ENCOUNTER — Other Ambulatory Visit: Payer: Self-pay

## 2018-03-20 ENCOUNTER — Encounter: Payer: Self-pay | Admitting: Family Medicine

## 2018-03-20 ENCOUNTER — Ambulatory Visit: Payer: Medicare Other | Admitting: Family Medicine

## 2018-03-20 VITALS — BP 128/80 | HR 62 | Temp 98.0°F | Wt 258.0 lb

## 2018-03-20 DIAGNOSIS — E108 Type 1 diabetes mellitus with unspecified complications: Secondary | ICD-10-CM

## 2018-03-20 DIAGNOSIS — I491 Atrial premature depolarization: Secondary | ICD-10-CM

## 2018-03-20 NOTE — Patient Instructions (Signed)
Thank you for coming to see me today. It was a pleasure! Today we talked about:   Your handicap placard. I have renewed this for 5 years.   Please come in if you start to have cardiac symptoms.   Please follow-up with me in 6 months or sooner as needed.  If you have any questions or concerns, please do not hesitate to call the office at [phone].  Take Care,   [name] Sherika Kubicki, DO

## 2018-03-20 NOTE — Progress Notes (Signed)
   Subjective:    Patient ID: [name], female    DOB: 01-14-1951, 67 y.o.   MRN: [id number]   CC:  HPI: Irregular Heart Rate: Previous hospitalization cardiology described patient as having sinus rhythm with PACs.  Patient unaware when it is beating irregularly. -Patient denies any symptoms -Denies any chest pain, shortness of breath, lightheadedness, dizziness, palpitations or weakness  Handicap placard renewal: -Patient would like renewal of her five-year handicap placard today. -Patient reports that she only has 1 car but over half the time the scar is being used by other family members so she also would like a placard for her sister's car he was when he drives her to most of her appointments. -Patient reporting that she is only able to walk with cane, or rolling walker.  Smoking status reviewed  Review of Systems Per HPI   Patient Active Problem List   Diagnosis Date Noted  . Nausea with vomiting 12/26/2017  . Diarrhea 10/15/2017  . Dehydration   . Frequent PVCs   . Hypokalemia   . Controlled type 1 diabetes mellitus with hyperglycemia, with long-term current use of insulin ([place])   . Hypomagnesemia   . BPPV (benign paroxysmal positional vertigo), right 06/13/2017  . Premature atrial beats 06/13/2017  . Snoring 07/31/2016  . Pre-ulcerative corn or callous 12/15/2015  . History of CHF (congestive heart failure) 10/29/2015  . Essential hypertension 10/29/2015  . Cerebrovascular accident (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 ([place]) 10/29/2015  . Toe injury 09/24/2015  . Thyroid activity decreased   . Intracranial vascular stenosis   . HLD (hyperlipidemia)   . Cerebral infarction due to thrombosis of basilar artery ([place])   . CVA (cerebral vascular accident) ([place]) 08/29/2015  . Preventative health care 09/30/2014  . Special screening for malignant neoplasms, colon 09/10/2014  . Great toe pain 07/09/2014  . Posterior tibial tendinitis of right leg 04/26/2014    . Complicated UTI (urinary tract infection) 12/24/2013  . Preventive measure 08/20/2013  . Morbid obesity ([place]) 05/29/2013  . Back pain without radiation 04/28/2013  . Nail dystrophy 01/10/2013  . Diabetes mellitus type I ([place]) 01/25/2012  . Hyperlipidemia 01/25/2012  . Hypothyroidism 01/25/2012  . Hypertension 01/25/2012  . GERD (gastroesophageal reflux disease) 01/25/2012     Objective:  BP 128/80   Pulse 62   Temp 98 F (36.7 C) (Oral)   Wt 258 lb (117 kg)   SpO2 99%   BMI 40.41 kg/m  Vitals and nursing note reviewed  General: NAD, pleasant, with cane Respiratory: normal effort Extremities: no edema or cyanosis. WWP. Skin: warm and dry, no rashes noted Neuro: alert and oriented, no focal deficits Psych: normal affect   Assessment & Plan:    Premature atrial beats Patient reporting that she does not have any symptoms still.  Will refer to cardiology when patient developed symptoms  Renewal of handicap placard Renewed patient's 5-year handicap placard during visit due to requiring use of cane or walker.  Patient unable to get his second dose of pneumonia vaccine as we are out in the office.  [name] Tamsyn Owusu, DO Family Medicine Resident PGY-1

## 2018-03-20 NOTE — Assessment & Plan Note (Signed)
Patient reporting that she does not have any symptoms still.  Will refer to cardiology when patient developed symptoms

## 2018-04-04 ENCOUNTER — Other Ambulatory Visit: Payer: Self-pay | Admitting: Family Medicine

## 2018-04-04 DIAGNOSIS — K219 Gastro-esophageal reflux disease without esophagitis: Secondary | ICD-10-CM

## 2018-04-17 ENCOUNTER — Encounter: Payer: Self-pay | Admitting: Family Medicine

## 2018-04-17 ENCOUNTER — Ambulatory Visit: Payer: Medicare Other | Admitting: Family Medicine

## 2018-04-17 VITALS — BP 140/86 | HR 88 | Temp 98.6°F | Ht 67.0 in | Wt 264.2 lb

## 2018-04-17 DIAGNOSIS — L739 Follicular disorder, unspecified: Secondary | ICD-10-CM | POA: Diagnosis not present

## 2018-04-17 MED ORDER — HYDROCORTISONE 1 % EX OINT: 1.0000 "application " | TOPICAL_OINTMENT | Freq: Two times a day (BID) | CUTANEOUS | 0 refills | Status: DC

## 2018-04-17 NOTE — Patient Instructions (Signed)
Thank you for coming in to see [place] today. Please see below to review our plan for today's visit.  1.  Your rash seems to be related to inflammation of the hair follicles.  I am not certain what caused this.  I sent in a prescription of a low potency steroid called hydrocortisone ointment.  Use this on the affected area daily.  This should improve over the next week.  Please return to the clinic if it is not improved in 2 weeks. 2.  You need to have more regular follow-up for your diabetes.  Schedule a follow-up soon with your primary care physician to discuss this.  Please call the clinic at [phone] if your symptoms worsen or you have any concerns. It was our pleasure to serve you.  Harriet Butte, [place], PGY-2

## 2018-04-17 NOTE — Assessment & Plan Note (Addendum)
Acute.  Primary complaint of itching.  No signs of underlying abscess.  Does not appear to be fungal.  Limited to the left armpit.  No contact irritant identified.  Patient for shingles given lack of dermatomal distribution. - We will trial with hydrocortisone ointment - RTC in 2 weeks if symptoms do not improve - Advised patient not to shave and use a non-scented deodorant and soap

## 2018-04-17 NOTE — Progress Notes (Signed)
   Subjective   Patient ID: [name]    DOB: 05-01-1951, 67 y.o. female   MRN: [id number]  CC: "Rash"  HPI: [name] is a 67 y.o. female who presents for a same day appointment for the following:  RASH  Had rash for 5 days. Location: left armpit Medications tried: calamine lotion Similar rash in past: no New medications or antibiotics: no Tick, Insect or new pet exposure: no Recent travel: no New detergent or soap: no Immunocompromised: T1DM, uuncontrolled  Symptoms Itching: yes Pain over rash: stinging sensation Feeling ill all over: no Fever: no Mouth sores: no Face or tongue swelling: no Trouble breathing: no Joint swelling or pain: no  ROS: see HPI for pertinent.  [place]: Uncontrolled T1DM, morbid obesity, HTN, HLD, GERD, HF, hypothyroidism, h/o CVA, BPPV.  Surgical history TAH, right ankle fracture, thyroidectomy.  Family history heart disease, HTN, renal disease. Smoking status reviewed. Medications reviewed.  Objective   BP 140/86   Pulse 88   Temp 98.6 F (37 C) (Oral)   Ht 5\' 7"  (1.702 m)   Wt 264 lb 3.2 oz (119.8 kg)   SpO2 99%   BMI 41.38 kg/m  Vitals and nursing note reviewed.  General: obese elderly woman with cane, well nourished, well developed, NAD with non-toxic appearance HEENT: normocephalic, atraumatic, moist mucous membranes Neck: supple, non-tender without lymphadenopathy Cardiovascular: regular rate and rhythm without murmurs, rubs, or gallops Lungs: clear to auscultation bilaterally with normal work of breathing Skin: warm, dry, cap refill < 2 seconds, pinpoint dark rash on left axilla localized to hair follicles without induration (see picture) Extremities: warm and well perfused, normal tone, no edema      Assessment & Plan   Folliculitis of left axilla Acute.  Primary complaint of itching.  No signs of underlying abscess.  Does not appear to be fungal.  Limited to the left armpit.  No contact irritant identified.   Patient for shingles given lack of dermatomal distribution. - We will trial with hydrocortisone ointment - RTC in 2 weeks if symptoms do not improve - Advised patient not to shave and use a non-scented deodorant and soap  No orders of the defined types were placed in this encounter.  Meds ordered this encounter  Medications  . hydrocortisone 1 % ointment    Sig: Apply 1 application topically 2 (two) times daily.    Dispense:  30 g    Refill:  0    Harriet Butte, [place], PGY-2 04/17/2018, 2:25 PM

## 2018-04-25 ENCOUNTER — Other Ambulatory Visit: Payer: Self-pay | Admitting: Internal Medicine

## 2018-04-25 DIAGNOSIS — I10 Essential (primary) hypertension: Secondary | ICD-10-CM

## 2018-05-24 ENCOUNTER — Other Ambulatory Visit: Payer: Self-pay | Admitting: Family Medicine

## 2018-05-31 ENCOUNTER — Other Ambulatory Visit: Payer: Self-pay | Admitting: Family Medicine

## 2018-05-31 DIAGNOSIS — K219 Gastro-esophageal reflux disease without esophagitis: Secondary | ICD-10-CM

## 2018-06-05 NOTE — Progress Notes (Signed)
Subjective:    Patient ID: [name], female    DOB: 01/12/1951, 67 y.o.   MRN: [id number]   CC:  HPI:  Congestion: - patient has had for about 1 week. Reports it is worse at night - Has tried theraflu which helped a little bit - going on a trip for a month and is concerned about it getting worse - Was taking allegra but stopped bc she thought it would not help her  Healthcare Maintenance: Patient refusing Dexa Scan Patient will need mammogram in October of this year Patient needs second dose of PNA vaccine Sees Dr. Janene Harvey, endocrinologist for her A1c; Dr. Pearson Grippe is her podiatrist who does foot exams, and Dr. Cecille Rubin is her eye doctor who she will see in July August.   Smoking status reviewed  Review of Systems Per HPI, also denies recent illness, fever, headache, changes in vision, chest pain, shortness of breath, abdominal pain, N/V/D, weakness   Patient Active Problem List   Diagnosis Date Noted  . Folliculitis of left axilla 04/17/2018  . Frequent PVCs   . Controlled type 1 diabetes mellitus with hyperglycemia, with long-term current use of insulin ([name])   . BPPV (benign paroxysmal positional vertigo), right 06/13/2017  . Premature atrial beats 06/13/2017  . Snoring 07/31/2016  . Pre-ulcerative corn or callous 12/15/2015  . History of CHF (congestive heart failure) 10/29/2015  . Essential hypertension 10/29/2015  . Cerebrovascular accident (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 ([place]) 10/29/2015  . Toe injury 09/24/2015  . Thyroid activity decreased   . Intracranial vascular stenosis   . HLD (hyperlipidemia)   . Cerebral infarction due to thrombosis of basilar artery ([place])   . CVA (cerebral vascular accident) ([place]) 08/29/2015  . Special screening for malignant neoplasms, colon 09/10/2014  . Great toe pain 07/09/2014  . Posterior tibial tendinitis of right leg 04/26/2014  . Complicated UTI (urinary tract infection) 12/24/2013  . Morbid obesity ([place])  05/29/2013  . Back pain without radiation 04/28/2013  . Nail dystrophy 01/10/2013  . Diabetes mellitus type I ([place]) 01/25/2012  . Hyperlipidemia 01/25/2012  . Hypothyroidism 01/25/2012  . Hypertension 01/25/2012  . GERD (gastroesophageal reflux disease) 01/25/2012     Objective:  BP 122/74   Pulse 67   Temp 98.1 F (36.7 C) (Oral)   Wt 120.7 kg (266 lb)   SpO2 99%   BMI 41.66 kg/m  Vitals and nursing note reviewed  General: NAD, pleasant HEENT: PERRLA, no facial tenderness, TMs with no erythema or bulging or purulence  Cardiac: RRR, normal heart sounds, no murmurs Respiratory: CTAB, normal effort Extremities: no edema or cyanosis. WWP. Skin: warm and dry, no rashes noted Neuro: alert and oriented, no focal deficits Psych: normal affect  Assessment & Plan:   Viral URI: Exam consistent with URI. No fevers, chills, rigors, sore throat concerning for influenza like illness. Overall pt is well appearing, well hydrated. Discussed symptomatic treatment: - continue to monitor for fevers  - continue Tylenol as needed for discomfort - nasal saline to help with his nasal congestion - Use a cool mist humidifier at bedtime to help with breathing - Stressed hydration - Honey for cough - Discussed return precautions  Healthcare Maintenance: Patient refusing Dexa Scan, explained benefits of this testing, but she is not interested Patient will need mammogram in October of this year Patient received second dose of PNA vaccine Sees Dr. Janene Harvey, endocrinologist for her A1c; Dr. Pearson Grippe is her podiatrist who does foot exams, and Dr.  Vindred is her eye doctor who she will see in July/ August.   [name] Maykel Reitter, DO Family Medicine Resident PGY-1

## 2018-06-07 ENCOUNTER — Other Ambulatory Visit: Payer: Self-pay

## 2018-06-07 ENCOUNTER — Ambulatory Visit (INDEPENDENT_AMBULATORY_CARE_PROVIDER_SITE_OTHER): Payer: Medicare Other | Admitting: Family Medicine

## 2018-06-07 ENCOUNTER — Encounter: Payer: Self-pay | Admitting: Family Medicine

## 2018-06-07 VITALS — BP 122/74 | HR 67 | Temp 98.1°F | Wt 266.0 lb

## 2018-06-07 DIAGNOSIS — E109 Type 1 diabetes mellitus without complications: Secondary | ICD-10-CM | POA: Diagnosis not present

## 2018-06-07 DIAGNOSIS — Z23 Encounter for immunization: Secondary | ICD-10-CM | POA: Diagnosis not present

## 2018-06-07 MED ORDER — BENZONATATE 100 MG PO CAPS: 100.0000 mg | ORAL_CAPSULE | Freq: Two times a day (BID) | ORAL | 0 refills | Status: DC | PRN

## 2018-06-07 NOTE — Patient Instructions (Addendum)
Thank you for coming to see me today. It was a pleasure! Today we talked about:   You have a cold and it should start to get better about 7 - 10 days after it started.    For your cough, try honey in tea and you can try Mucinex (guaifenesin). Please drink plenty of water with this.  Continue your Allegra daily.   Some other therapies you can try are: push fluids, rest and return office visit prn if symptoms persist or worsen.   Drinking warm liquids such as teas and soups can help with secretions and cough. A mist humidifier or vaporizer can work well to help with secretions and cough.  It is very important to clean the humidifier between use according to the instructions.   You may also use a sinus rinse with distilled water and salt, like we discussed. NeilMed is one example. Be sure to clean it as instructed.  Of course, if you start having trouble breathing, worsening fevers, vomiting and unable to hold down any fluids, or you have other concerns, don't hesitate to come back or go to the ED after hours.   Please follow-up with me in 6 months or sooner as needed.  If you have any questions or concerns, please do not hesitate to call the office at [phone].  Take Care,   [name] Tarryn Bogdan, DO

## 2018-06-25 ENCOUNTER — Encounter: Payer: Self-pay | Admitting: Family Medicine

## 2018-06-25 ENCOUNTER — Other Ambulatory Visit: Payer: Self-pay | Admitting: Family Medicine

## 2018-06-25 ENCOUNTER — Telehealth: Payer: Self-pay

## 2018-06-25 DIAGNOSIS — Y92009 Unspecified place in unspecified non-institutional (private) residence as the place of occurrence of the external cause: Principal | ICD-10-CM

## 2018-06-25 DIAGNOSIS — W19XXXA Unspecified fall, initial encounter: Secondary | ICD-10-CM

## 2018-06-25 NOTE — Telephone Encounter (Signed)
Pt called back, she actually would like [place] to fax the script to [facility] so she does not have to pick it up. Sandeep Radell, [place] Spotted, CMA

## 2018-06-25 NOTE — Progress Notes (Signed)
Patient fell in [place] while in [place], will send her rolling walker to help her since her fall to prevent further falls. DME order placed for [facility].

## 2018-06-25 NOTE — Telephone Encounter (Signed)
Called patient in reference to her mychart message... Pt stated she fell at church and purchased a walker on [place]. Pt does not feel like this walker is quality and wants a "rollator walker." Pt called her insurance company and all they need from pcp is a written rx for a "rollator walker." Pt will pick this up when she returns from [place]. Please advise.

## 2018-06-26 ENCOUNTER — Telehealth: Payer: Self-pay

## 2018-06-26 NOTE — Telephone Encounter (Signed)
Patient called to see if PCP got her request for a rollator walker. PCP entered order on 06/25/18. Community message sent to [facility] regarding this. They will let us know if anything else is needed. Patient made aware.  Danley Danker, RN [facility] RN)

## 2018-07-14 ENCOUNTER — Other Ambulatory Visit: Payer: Self-pay | Admitting: Family Medicine

## 2018-08-01 ENCOUNTER — Encounter (INDEPENDENT_AMBULATORY_CARE_PROVIDER_SITE_OTHER): Payer: Medicare Other | Admitting: Ophthalmology

## 2018-08-01 DIAGNOSIS — E11311 Type 2 diabetes mellitus with unspecified diabetic retinopathy with macular edema: Secondary | ICD-10-CM

## 2018-08-01 DIAGNOSIS — E113313 Type 2 diabetes mellitus with moderate nonproliferative diabetic retinopathy with macular edema, bilateral: Secondary | ICD-10-CM

## 2018-08-01 DIAGNOSIS — I10 Essential (primary) hypertension: Secondary | ICD-10-CM

## 2018-08-01 DIAGNOSIS — H35033 Hypertensive retinopathy, bilateral: Secondary | ICD-10-CM | POA: Diagnosis not present

## 2018-08-01 DIAGNOSIS — H43813 Vitreous degeneration, bilateral: Secondary | ICD-10-CM

## 2018-08-01 DIAGNOSIS — H26493 Other secondary cataract, bilateral: Secondary | ICD-10-CM

## 2018-08-08 ENCOUNTER — Encounter (INDEPENDENT_AMBULATORY_CARE_PROVIDER_SITE_OTHER): Payer: Medicare Other | Admitting: Ophthalmology

## 2018-08-08 ENCOUNTER — Encounter: Payer: Medicare Other | Admitting: *Deleted

## 2018-08-08 DIAGNOSIS — Z006 Encounter for examination for normal comparison and control in clinical research program: Secondary | ICD-10-CM

## 2018-08-08 NOTE — Progress Notes (Signed)
STROKE~AF Research study month 36 final visit completed. Patient denies any adverse events or changes in medication. I thanked her for her participation in the study. She would like the research department to keep her name on our list for any future studies.

## 2018-08-09 ENCOUNTER — Encounter (INDEPENDENT_AMBULATORY_CARE_PROVIDER_SITE_OTHER): Payer: Medicare Other | Admitting: Ophthalmology

## 2018-08-19 ENCOUNTER — Other Ambulatory Visit: Payer: Self-pay | Admitting: Family Medicine

## 2018-08-19 DIAGNOSIS — Z1231 Encounter for screening mammogram for malignant neoplasm of breast: Secondary | ICD-10-CM

## 2018-08-29 ENCOUNTER — Encounter (INDEPENDENT_AMBULATORY_CARE_PROVIDER_SITE_OTHER): Payer: Medicare Other | Admitting: Ophthalmology

## 2018-08-29 DIAGNOSIS — E10311 Type 1 diabetes mellitus with unspecified diabetic retinopathy with macular edema: Secondary | ICD-10-CM | POA: Diagnosis not present

## 2018-08-29 DIAGNOSIS — E103513 Type 1 diabetes mellitus with proliferative diabetic retinopathy with macular edema, bilateral: Secondary | ICD-10-CM | POA: Diagnosis not present

## 2018-08-29 DIAGNOSIS — H35033 Hypertensive retinopathy, bilateral: Secondary | ICD-10-CM

## 2018-08-29 DIAGNOSIS — I10 Essential (primary) hypertension: Secondary | ICD-10-CM

## 2018-08-29 DIAGNOSIS — H43813 Vitreous degeneration, bilateral: Secondary | ICD-10-CM

## 2018-08-30 ENCOUNTER — Other Ambulatory Visit: Payer: Self-pay | Admitting: Family Medicine

## 2018-08-30 DIAGNOSIS — I10 Essential (primary) hypertension: Secondary | ICD-10-CM

## 2018-09-21 ENCOUNTER — Other Ambulatory Visit: Payer: Self-pay | Admitting: Family Medicine

## 2018-09-21 DIAGNOSIS — K219 Gastro-esophageal reflux disease without esophagitis: Secondary | ICD-10-CM

## 2018-09-26 ENCOUNTER — Encounter (INDEPENDENT_AMBULATORY_CARE_PROVIDER_SITE_OTHER): Payer: Medicare Other | Admitting: Ophthalmology

## 2018-09-26 DIAGNOSIS — E113513 Type 2 diabetes mellitus with proliferative diabetic retinopathy with macular edema, bilateral: Secondary | ICD-10-CM | POA: Diagnosis not present

## 2018-09-26 DIAGNOSIS — H43813 Vitreous degeneration, bilateral: Secondary | ICD-10-CM

## 2018-09-26 DIAGNOSIS — I10 Essential (primary) hypertension: Secondary | ICD-10-CM

## 2018-09-26 DIAGNOSIS — H35033 Hypertensive retinopathy, bilateral: Secondary | ICD-10-CM | POA: Diagnosis not present

## 2018-09-26 DIAGNOSIS — E11311 Type 2 diabetes mellitus with unspecified diabetic retinopathy with macular edema: Secondary | ICD-10-CM | POA: Diagnosis not present

## 2018-09-29 ENCOUNTER — Other Ambulatory Visit: Payer: Self-pay | Admitting: Family Medicine

## 2018-10-04 ENCOUNTER — Ambulatory Visit
Admission: RE | Admit: 2018-10-04 | Discharge: 2018-10-04 | Disposition: A | Discharge status: Home / Self Care | Payer: Medicare Other | Source: Ambulatory Visit | Attending: Family Medicine | Admitting: Family Medicine

## 2018-10-04 DIAGNOSIS — Z1231 Encounter for screening mammogram for malignant neoplasm of breast: Secondary | ICD-10-CM

## 2018-10-10 ENCOUNTER — Encounter (INDEPENDENT_AMBULATORY_CARE_PROVIDER_SITE_OTHER): Payer: Medicare Other | Admitting: Ophthalmology

## 2018-10-10 ENCOUNTER — Ambulatory Visit: Payer: Medicare Other

## 2018-10-13 ENCOUNTER — Emergency Department (HOSPITAL_COMMUNITY): Payer: Medicare Other

## 2018-10-13 ENCOUNTER — Inpatient Hospital Stay (HOSPITAL_COMMUNITY)
Admission: EM | Admit: 2018-10-13 | Discharge: 2018-10-16 | DRG: 065 | Disposition: A | Discharge status: Home Health | Payer: Medicare Other | Attending: Family Medicine | Admitting: Family Medicine

## 2018-10-13 ENCOUNTER — Encounter (HOSPITAL_COMMUNITY): Payer: Self-pay

## 2018-10-13 DIAGNOSIS — D649 Anemia, unspecified: Secondary | ICD-10-CM | POA: Diagnosis present

## 2018-10-13 DIAGNOSIS — R931 Abnormal findings on diagnostic imaging of heart and coronary circulation: Secondary | ICD-10-CM

## 2018-10-13 DIAGNOSIS — Z823 Family history of stroke: Secondary | ICD-10-CM

## 2018-10-13 DIAGNOSIS — Z85850 Personal history of malignant neoplasm of thyroid: Secondary | ICD-10-CM

## 2018-10-13 DIAGNOSIS — E785 Hyperlipidemia, unspecified: Secondary | ICD-10-CM | POA: Diagnosis present

## 2018-10-13 DIAGNOSIS — I471 Supraventricular tachycardia: Secondary | ICD-10-CM | POA: Diagnosis not present

## 2018-10-13 DIAGNOSIS — I10 Essential (primary) hypertension: Secondary | ICD-10-CM | POA: Diagnosis present

## 2018-10-13 DIAGNOSIS — Z7902 Long term (current) use of antithrombotics/antiplatelets: Secondary | ICD-10-CM

## 2018-10-13 DIAGNOSIS — I272 Pulmonary hypertension, unspecified: Secondary | ICD-10-CM | POA: Diagnosis present

## 2018-10-13 DIAGNOSIS — E1159 Type 2 diabetes mellitus with other circulatory complications: Secondary | ICD-10-CM | POA: Diagnosis not present

## 2018-10-13 DIAGNOSIS — Z794 Long term (current) use of insulin: Secondary | ICD-10-CM | POA: Diagnosis not present

## 2018-10-13 DIAGNOSIS — I63311 Cerebral infarction due to thrombosis of right middle cerebral artery: Secondary | ICD-10-CM | POA: Diagnosis not present

## 2018-10-13 DIAGNOSIS — Z8673 Personal history of transient ischemic attack (TIA), and cerebral infarction without residual deficits: Secondary | ICD-10-CM

## 2018-10-13 DIAGNOSIS — I6329 Cerebral infarction due to unspecified occlusion or stenosis of other precerebral arteries: Secondary | ICD-10-CM | POA: Diagnosis present

## 2018-10-13 DIAGNOSIS — R29702 NIHSS score 2: Secondary | ICD-10-CM | POA: Diagnosis present

## 2018-10-13 DIAGNOSIS — Z79899 Other long term (current) drug therapy: Secondary | ICD-10-CM | POA: Diagnosis not present

## 2018-10-13 DIAGNOSIS — I639 Cerebral infarction, unspecified: Secondary | ICD-10-CM | POA: Diagnosis present

## 2018-10-13 DIAGNOSIS — I491 Atrial premature depolarization: Secondary | ICD-10-CM | POA: Diagnosis present

## 2018-10-13 DIAGNOSIS — I498 Other specified cardiac arrhythmias: Secondary | ICD-10-CM | POA: Diagnosis not present

## 2018-10-13 DIAGNOSIS — I6302 Cerebral infarction due to thrombosis of basilar artery: Secondary | ICD-10-CM | POA: Diagnosis not present

## 2018-10-13 DIAGNOSIS — Z6841 Body Mass Index (BMI) 40.0 and over, adult: Secondary | ICD-10-CM

## 2018-10-13 DIAGNOSIS — R9431 Abnormal electrocardiogram [ECG] [EKG]: Secondary | ICD-10-CM | POA: Diagnosis not present

## 2018-10-13 DIAGNOSIS — Z7982 Long term (current) use of aspirin: Secondary | ICD-10-CM | POA: Diagnosis not present

## 2018-10-13 DIAGNOSIS — G8194 Hemiplegia, unspecified affecting left nondominant side: Secondary | ICD-10-CM | POA: Diagnosis present

## 2018-10-13 DIAGNOSIS — E890 Postprocedural hypothyroidism: Secondary | ICD-10-CM | POA: Diagnosis present

## 2018-10-13 DIAGNOSIS — E1065 Type 1 diabetes mellitus with hyperglycemia: Secondary | ICD-10-CM | POA: Diagnosis present

## 2018-10-13 DIAGNOSIS — K219 Gastro-esophageal reflux disease without esophagitis: Secondary | ICD-10-CM | POA: Diagnosis present

## 2018-10-13 DIAGNOSIS — G4733 Obstructive sleep apnea (adult) (pediatric): Secondary | ICD-10-CM | POA: Diagnosis present

## 2018-10-13 DIAGNOSIS — E1169 Type 2 diabetes mellitus with other specified complication: Secondary | ICD-10-CM | POA: Diagnosis not present

## 2018-10-13 DIAGNOSIS — E1059 Type 1 diabetes mellitus with other circulatory complications: Secondary | ICD-10-CM

## 2018-10-13 LAB — CBC
HCT: 36.6 % (ref 36.0–46.0)
HEMOGLOBIN: 11.1 g/dL — AB (ref 12.0–15.0)
MCH: 28 pg (ref 26.0–34.0)
MCHC: 30.3 g/dL (ref 30.0–36.0)
MCV: 92.4 fL (ref 80.0–100.0)
NRBC: 0 % (ref 0.0–0.2)
Platelets: 231 10*3/uL (ref 150–400)
RBC: 3.96 MIL/uL (ref 3.87–5.11)
RDW: 12.1 % (ref 11.5–15.5)
WBC: 5.3 10*3/uL (ref 4.0–10.5)

## 2018-10-13 LAB — COMPREHENSIVE METABOLIC PANEL
ALT: 22 U/L (ref 0–44)
AST: 22 U/L (ref 15–41)
Albumin: 3.1 g/dL — ABNORMAL LOW (ref 3.5–5.0)
Alkaline Phosphatase: 74 U/L (ref 38–126)
Anion gap: 11 (ref 5–15)
BILIRUBIN TOTAL: 0.5 mg/dL (ref 0.3–1.2)
BUN: 14 mg/dL (ref 8–23)
CO2: 24 mmol/L (ref 22–32)
CREATININE: 1.04 mg/dL — AB (ref 0.44–1.00)
Calcium: 8.9 mg/dL (ref 8.9–10.3)
Chloride: 101 mmol/L (ref 98–111)
GFR calc non Af Amer: 54 mL/min — ABNORMAL LOW (ref >60)
Glucose, Bld: 312 mg/dL — ABNORMAL HIGH (ref 70–99)
Potassium: 3.5 mmol/L (ref 3.5–5.1)
SODIUM: 136 mmol/L (ref 135–145)
TOTAL PROTEIN: 6.8 g/dL (ref 6.5–8.1)

## 2018-10-13 LAB — DIFFERENTIAL
Abs Immature Granulocytes: 0.01 10*3/uL (ref 0.00–0.07)
Basophils Absolute: 0 10*3/uL (ref 0.0–0.1)
Basophils Relative: 1 %
Eosinophils Absolute: 0.1 10*3/uL (ref 0.0–0.5)
Eosinophils Relative: 2 %
Immature Granulocytes: 0 %
Lymphocytes Relative: 35 %
Lymphs Abs: 1.9 10*3/uL (ref 0.7–4.0)
Monocytes Absolute: 0.7 10*3/uL (ref 0.1–1.0)
Monocytes Relative: 13 %
Neutro Abs: 2.7 10*3/uL (ref 1.7–7.7)
Neutrophils Relative %: 49 %

## 2018-10-13 LAB — I-STAT CHEM 8, ED
BUN: 16 mg/dL (ref 8–23)
CHLORIDE: 102 mmol/L (ref 98–111)
Calcium, Ion: 1.14 mmol/L — ABNORMAL LOW (ref 1.15–1.40)
Creatinine, Ser: 0.9 mg/dL (ref 0.44–1.00)
GLUCOSE: 310 mg/dL — AB (ref 70–99)
HCT: 35 % — ABNORMAL LOW (ref 36.0–46.0)
HEMOGLOBIN: 11.9 g/dL — AB (ref 12.0–15.0)
POTASSIUM: 3.7 mmol/L (ref 3.5–5.1)
Sodium: 140 mmol/L (ref 135–145)
TCO2: 27 mmol/L (ref 22–32)

## 2018-10-13 LAB — CBG MONITORING, ED: Glucose-Capillary: 260 mg/dL — ABNORMAL HIGH (ref 70–99)

## 2018-10-13 LAB — I-STAT TROPONIN, ED: Troponin i, poc: 0.01 ng/mL (ref 0.00–0.08)

## 2018-10-13 LAB — PROTIME-INR
INR: 1.03
PROTHROMBIN TIME: 13.4 s (ref 11.4–15.2)

## 2018-10-13 LAB — APTT: aPTT: 29 seconds (ref 24–36)

## 2018-10-13 MED ORDER — ASPIRIN 325 MG PO TABS
325.0000 mg | ORAL_TABLET | Freq: Every day | ORAL | Status: DC
  Administered 2018-10-14 – 2018-10-16 (×3): 325 mg via ORAL
  Filled 2018-10-13 (×3): qty 1

## 2018-10-13 MED ORDER — SENNOSIDES-DOCUSATE SODIUM 8.6-50 MG PO TABS
1.0000 | ORAL_TABLET | Freq: Every evening | ORAL | Status: DC | PRN
  Administered 2018-10-15: 1 via ORAL
  Filled 2018-10-13: qty 1

## 2018-10-13 MED ORDER — INSULIN PUMP
Freq: Three times a day (TID) | SUBCUTANEOUS | Status: DC
  Administered 2018-10-14: 1.1 via SUBCUTANEOUS
  Administered 2018-10-14: 2.8 via SUBCUTANEOUS
  Administered 2018-10-15: 1.4 via SUBCUTANEOUS
  Administered 2018-10-15: 1.3 via SUBCUTANEOUS
  Administered 2018-10-15: 3.2 via SUBCUTANEOUS
  Administered 2018-10-15: 0.8 via SUBCUTANEOUS
  Administered 2018-10-15: 3.5 via SUBCUTANEOUS
  Administered 2018-10-16: 0.7 via SUBCUTANEOUS
  Administered 2018-10-16: 0.9 via SUBCUTANEOUS
  Administered 2018-10-16: 3.2 via SUBCUTANEOUS
  Filled 2018-10-13: qty 1

## 2018-10-13 MED ORDER — ACETAMINOPHEN 325 MG PO TABS
650.0000 mg | ORAL_TABLET | ORAL | Status: DC | PRN
  Administered 2018-10-14 – 2018-10-16 (×4): 650 mg via ORAL
  Filled 2018-10-13 (×4): qty 2

## 2018-10-13 MED ORDER — ASPIRIN 300 MG RE SUPP: 300.0000 mg | Freq: Every day | RECTAL | Status: DC

## 2018-10-13 MED ORDER — STROKE: EARLY STAGES OF RECOVERY BOOK
Freq: Once | Status: AC
  Administered 2018-10-15: 23:00:00

## 2018-10-13 MED ORDER — FAMOTIDINE 10 MG PO TABS
10.0000 mg | ORAL_TABLET | Freq: Every day | ORAL | Status: DC
  Administered 2018-10-14 – 2018-10-16 (×3): 10 mg via ORAL
  Filled 2018-10-13 (×3): qty 1

## 2018-10-13 MED ORDER — ENOXAPARIN SODIUM 40 MG/0.4ML ~~LOC~~ SOLN
40.0000 mg | SUBCUTANEOUS | Status: DC
  Administered 2018-10-14 – 2018-10-16 (×3): 40 mg via SUBCUTANEOUS
  Filled 2018-10-13 (×3): qty 0.4

## 2018-10-13 MED ORDER — ACETAMINOPHEN 160 MG/5ML PO SOLN: 650.0000 mg | ORAL | Status: DC | PRN

## 2018-10-13 MED ORDER — CLOPIDOGREL BISULFATE 75 MG PO TABS
75.0000 mg | ORAL_TABLET | Freq: Every day | ORAL | Status: DC
  Administered 2018-10-14 – 2018-10-16 (×3): 75 mg via ORAL
  Filled 2018-10-13 (×3): qty 1

## 2018-10-13 MED ORDER — LEVOTHYROXINE SODIUM 75 MCG PO TABS
175.0000 ug | ORAL_TABLET | Freq: Every day | ORAL | Status: DC
  Administered 2018-10-14 – 2018-10-16 (×3): 175 ug via ORAL
  Filled 2018-10-13 (×3): qty 1

## 2018-10-13 MED ORDER — ATORVASTATIN CALCIUM 40 MG PO TABS
40.0000 mg | ORAL_TABLET | Freq: Every day | ORAL | Status: DC
  Administered 2018-10-14: 40 mg via ORAL
  Filled 2018-10-13: qty 1

## 2018-10-13 MED ORDER — ACETAMINOPHEN 650 MG RE SUPP: 650.0000 mg | RECTAL | Status: DC | PRN

## 2018-10-13 MED ORDER — SODIUM CHLORIDE 0.9 % IV SOLN
INTRAVENOUS | Status: DC
  Administered 2018-10-14: via INTRAVENOUS

## 2018-10-13 NOTE — H&P (Addendum)
[facility] Admission History and Physical Service Pager: [phone]  Patient name: [name] Medical record number: [id number] Date of birth: [birth year] Age: 67 y.o. Gender: female  Primary Care Provider: Shirley, [name], DO Consultants: Neurology Code Status: Full, confirmed on admission  Chief Complaint: L sided extremity numbness  Assessment and Plan: [name] is a 67 y.o. female presenting with new stroke. PMH is significant for previous stroke, T1DM, HLD, Hypothyroidism, HTN, GERD.   TIA vs Stroke, new  Patient presenting with symptoms of left sided numbness beginning around 830PM, last seen at baseline ~8PM. No other symptoms of weakness or slurred speak.  MR brain pending. CT head showing no acute findings (ASPECTS 10), chronic lacune in the upper left pons. No TPA given 2/2 low NIH stroke score. RF for stroke include h/o HTN, HLD, and T1DM. BP to 187/63 upon admission. Neurology consulted, recommended admission for further stroke workup. Patient with h/o CVA in 2016 with infarct of dorsal brainstem region of medial lemniscus. No neurological deficits from previous stroke.  - admit as telemetry, attending Dr. Nori Riis - vitals per floor protocol - neuro following; appreciate recs - monitor on telemetry - continuous pulse ox  - echo - carotid dopplers - MRI/MRA - permissive hypertension x24 hours - risk strat labs: hgb A1C, lipid, TSH  - swallow eval - PT/OT/SLP - atorvastatin 40mg  daily  - ASA 325mg  Qd - Plavix 75mg  daily   HTN Currently allowing permissive HTN. 184/71 on admission. Takes losartan-HCTZ 100-12.5mg  daily at home.  - hold home meds to allow permissive HTN - continue to monitor   HLD Takes atorvastatin 40mg  daily - lipid panel  - continue home meds   Type 1 Diabetes Followed by [name] with Endocrinology, patient with frequent visits every 6 months. Next appointment 10/2018. On insulin pump.  CBG on admission 312. Per chart review patient with poor control and elevated A1Cs.  - continue insulin pump  - A1C - monitor CBGs closely   Irregular Heart Beat Noted during previous hospitalization and described as sinus with PACs. Previously referred to Cardiology, per patient she has not seen them.  - continuous cardiac monitoring  - can consider consult to cardiology if symptomatic   GERD Home meds: zantac 150mg  bid - pepcid while inpatient as zantac not on formulary   Thyroid Cancer S/p thyroidectomy 2006. Currently on synthroid 175 mcg daily - TSH - continue home meds   Uterine Fibroids S/p hysterectomy  FEN/GI: NPO pending swallow study Prophylaxis: lovenox   Disposition: admit to telemetry, attending Dr. Nori Riis   History of Present Illness:  [name] is a 67 y.o. female presenting with new stroke.   She states her daughter woke her up from a nap around 8:30pm this evening and noticed L sided numbness. States she fell asleep maybe around 8pm, at which time she was at her baseline neurological status. Patient states since waking up her entire left side has been numb. She is able to move all extremities, however they feel numb. She also states face and neck numbness as well.   States she was supposed to have laser eye surgery tomorrow and received new eye drops, otherwise denies new medications. States she felt like she was getting ready to get a cold and took an OTC sinus medication. Denies missed medications. Had previous stroke of basilar artery 3 years ago. Denies tobacco, alcohol, drug use.  Patient has been working very hard to help control her  blood sugars. She has T1DM on insulin pump and has been trying to lose weight and eat right. Followed by [name] q6 months.   Patient lives with her sister and granddaugther. Patient cares for herself and does all ADLs independently. States she organizes her medications by herself in a pill box weekly.    Review Of Systems: Per HPI with the following additions:   Review of Systems  Respiratory: Negative for shortness of breath.   Cardiovascular: Negative for chest pain.  Gastrointestinal: Negative for abdominal pain, constipation, diarrhea, nausea and vomiting.  Genitourinary: Negative for dysuria, frequency and urgency.  Neurological: Negative for dizziness and headaches.    Patient Active Problem List   Diagnosis Date Noted  . Stroke (cerebrum) ([place]) 10/13/2018  . Folliculitis of left axilla 04/17/2018  . Frequent PVCs   . Controlled type 1 diabetes mellitus with hyperglycemia, with long-term current use of insulin ([place])   . BPPV (benign paroxysmal positional vertigo), right 06/13/2017  . Premature atrial beats 06/13/2017  . Snoring 07/31/2016  . Pre-ulcerative corn or callous 12/15/2015  . History of CHF (congestive heart failure) 10/29/2015  . Essential hypertension 10/29/2015  . Cerebrovascular accident (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 ([place]) 10/29/2015  . Toe injury 09/24/2015  . Thyroid activity decreased   . Intracranial vascular stenosis   . HLD (hyperlipidemia)   . Cerebral infarction due to thrombosis of basilar artery ([place])   . CVA (cerebral vascular accident) ([place]) 08/29/2015  . Special screening for malignant neoplasms, colon 09/10/2014  . Great toe pain 07/09/2014  . Posterior tibial tendinitis of right leg 04/26/2014  . Complicated UTI (urinary tract infection) 12/24/2013  . Morbid obesity ([place]) 05/29/2013  . Back pain without radiation 04/28/2013  . Nail dystrophy 01/10/2013  . Diabetes mellitus type I ([place]) 01/25/2012  . Hyperlipidemia 01/25/2012  . Hypothyroidism 01/25/2012  . Hypertension 01/25/2012  . GERD (gastroesophageal reflux disease) 01/25/2012    Past Medical History: Past Medical History:  Diagnosis Date  . Anemia   . Arthritis   . CHF (congestive heart failure) ([place])   . Depression   . GERD (gastroesophageal reflux disease)    . Hemorrhoids   . Hyperlipidemia   . Hypertension   . Hypothyroidism   . Seasonal allergies   . Stroke ([place])   . Thyroid cancer ([place])    per pt S/p Total Thyroidectomy with Radioactive Iodine Therapy  . Type 1 diabetes mellitus ([place])     Past Surgical History: Past Surgical History:  Procedure Laterality Date  . ABDOMINAL ANGIOGRAM N/A 05/14/2012   Procedure: ABDOMINAL ANGIOGRAM;  Surgeon: Laverda Page, MD;  Location: [facility] CATH LAB;  Service: Cardiovascular;  Laterality: N/A;  . ABDOMINAL HYSTERECTOMY  1995   partial  . ANKLE FRACTURE SURGERY Right   . CARDIAC CATHETERIZATION     with coronary angiogram  . COLONOSCOPY N/A 09/09/2014   Procedure: COLONOSCOPY;  Surgeon: Gatha Mayer, MD;  Location: [place];  Service: Endoscopy;  Laterality: N/A;  . LEFT AND RIGHT HEART CATHETERIZATION WITH CORONARY ANGIOGRAM N/A 05/14/2012   Procedure: LEFT AND RIGHT HEART CATHETERIZATION WITH CORONARY ANGIOGRAM;  Surgeon: Laverda Page, MD;  Location: [facility] CATH LAB;  Service: Cardiovascular;  Laterality: N/A;  . MINI METER   09/08/2014   ELECTRONIC INSULIN PUMP  . THYROIDECTOMY  2006    Social History: Social History   Tobacco Use  . Smoking status: Never Smoker  . Smokeless tobacco: Never Used  Substance Use Topics  .  Alcohol use: No  . Drug use: No   Additional social history: retired Pharmacist, hospital.  Please also refer to relevant sections of EMR.  Family History: Family History  Problem Relation Age of Onset  . Heart disease Mother        MI  . Hyperlipidemia Mother   . Hypertension Mother   . Renal Disease Mother        insufficiency  . Early death Father        Head Injury at Work  . Hyperlipidemia Father   . Hypertension Father   . Stroke Son   . Diabetes Maternal Aunt   . Prostate cancer Paternal Uncle   . Heart disease Maternal Aunt   . Breast cancer Neg Hx     Allergies and Medications: No Known Allergies No current facility-administered medications on file  prior to encounter.    Current Outpatient Medications on File Prior to Encounter  Medication Sig Dispense Refill  . atorvastatin (LIPITOR) 40 MG tablet TAKE 1 TABLET(40 MG) BY MOUTH DAILY (Patient taking differently: Take 40 mg by mouth daily at 6 PM. ) 90 tablet 3  . Besifloxacin HCl (BESIVANCE) 0.6 % SUSP Place 1 drop into both eyes See admin instructions. 4 times daily for 2 days after eye injection    . clopidogrel (PLAVIX) 75 MG tablet TAKE 1 TABLET BY MOUTH EVERY DAY (Patient taking differently: Take 75 mg by mouth daily. ) 90 tablet 0  . CRANBERRY PO Take 1 tablet by mouth daily.    . fexofenadine (ALLEGRA) 30 MG tablet Take 30 mg by mouth daily as needed (allergies).     . Insulin Human (INSULIN PUMP) SOLN Inject into the skin continuous. humalog insulin    . levothyroxine (SYNTHROID, LEVOTHROID) 175 MCG tablet Take 175 mcg by mouth daily.  12  . losartan-hydrochlorothiazide (HYZAAR) 100-12.5 MG tablet Take 1 tablet by mouth daily. 90 tablet 0  . multivitamin-lutein (OCUVITE-LUTEIN) CAPS capsule Take 1 capsule by mouth daily.    . Potassium 99 MG TABS Take 99 mg by mouth daily.    . ranitidine (ZANTAC) 150 MG tablet TAKE 1 TABLET BY MOUTH TWICE DAILY (Patient taking differently: Take 150 mg by mouth 2 (two) times daily. ) 180 tablet 3    Objective: BP (!) 156/63 (BP Location: Right Arm)   Pulse (!) 59   Temp 97.9 F (36.6 C) (Oral)   Resp 20   Ht 5\' 7"  (1.702 m)   Wt 125.8 kg   SpO2 100%   BMI 43.44 kg/m  Exam: General: awake and alert, laying in bed, family at bedside, NAD Eyes: PERRL, EOMI, no scleral icterus  ENTM: moist mucous membranes, uvula midline  Neck: supple  Cardiovascular: RRR, no MRG  Respiratory: CTAB, no wheezes, rales, or rhonchi, speaking full sentences, no increased WOB Gastrointestinal: soft, non tender, non distended, bowel sounds x4 quadrants MSK: no edema, non tender Derm: intact, no rashes, warm  Neuro: CN2-12 intact, no finger to nose dysmetria,  sensation diminished on left side, 4/5 muscle strength in LLE, 5/5 muscle strength in upper extremities bilaterally, normal grip strength bilaterally  Psych: normal affect   Labs and Imaging: CBC BMET  Recent Labs  Lab 10/13/18 2131 10/13/18 2135  WBC 5.3  --   HGB 11.1* 11.9*  HCT 36.6 35.0*  PLT 231  --    Recent Labs  Lab 10/13/18 2131 10/13/18 2135  NA 136 140  K 3.5 3.7  CL 101 102  CO2 24  --  BUN 14 16  CREATININE 1.04* 0.90  GLUCOSE 312* 310*  CALCIUM 8.9  --        Ref. Range 10/13/2018 21:34  Troponin i, poc Latest Ref Range: 0.00 - 0.08 ng/mL 0.01    Ref. Range 10/13/2018 21:31  Prothrombin Time Latest Ref Range: 11.4 - 15.2 seconds 13.4  INR Unknown 1.03   Mm 3d Screen Breast Bilateral  Result Date: 10/04/2018 CLINICAL DATA:  Screening. EXAM: DIGITAL SCREENING BILATERAL MAMMOGRAM WITH TOMO AND CAD COMPARISON:  Previous exam(s). ACR Breast Density Category b: There are scattered areas of fibroglandular density. FINDINGS: There are no findings suspicious for malignancy. Images were processed with CAD. IMPRESSION: No mammographic evidence of malignancy. A result letter of this screening mammogram will be mailed directly to the patient. RECOMMENDATION: Screening mammogram in one year. (Code:SM-B-01Y) BI-RADS CATEGORY  1: Negative. Electronically Signed   By: Lajean Manes M.D.   On: 10/04/2018 16:03   Ct Head Code Stroke Wo Contrast  Result Date: 10/13/2018 CLINICAL DATA:  Code stroke.  Left-sided weakness. EXAM: CT HEAD WITHOUT CONTRAST TECHNIQUE: Contiguous axial images were obtained from the base of the skull through the vertex without intravenous contrast. COMPARISON:  04/03/2016 FINDINGS: Brain: No evidence of acute infarction, hemorrhage, hydrocephalus, extra-axial collection or mass lesion/mass effect. 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 Mild, age congruent cerebral volume loss. Vascular: Atherosclerotic calcification.  No hyperdense vessel. Skull:  Normal. Negative for fracture or focal lesion. Sinuses/Orbits: No acute finding. Other: These results were communicated to Dr. Leonel Ramsay at 9:41 pmon 10/20/2019by text page via the [facility] messaging system. ASPECTS [facility] Stroke Program Early CT Score) - Ganglionic level infarction (caudate, lentiform nuclei, internal capsule, insula, M1-M3 cortex): 7 - Supraganglionic infarction (M4-M6 cortex): 3 Total score (0-10 with 10 being normal): 10 IMPRESSION: 1. No acute finding.  ASPECTS is 10. 2. 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 Electronically Signed   By: Monte Fantasia M.D.   On: 10/13/2018 21:43    Caroline More, DO 10/13/2018, 11:42 PM PGY-2, B and E Intern pager: [phone], text pages welcome

## 2018-10-13 NOTE — ED Provider Notes (Signed)
[facility]ERGENCY DEPARTMENT Provider Note   CSN: [id number] Arrival date & time: 10/13/18  2129     History   Chief Complaint Chief Complaint  Patient presents with  . Code Stroke    HPI [name] Anniebell Bedore is a 67 y.o. female.  67 yo F with a chief complaint of left-sided weakness.  The patient took a nap after cooking dinner tonight and then woke up and had weakness to the left leg and arm.  She was unable to ambulate and an ambulance was called.  She was made a code stroke upon arrival.  Since then she feels that her symptoms have gotten somewhat better.  She thinks her symptoms were worse in the leg than the arm.  She denies headache denies neck pain denies chest pain shortness of breath abdominal pain vomiting.  The history is provided by the patient.  Illness  This is a new problem. The current episode started 1 to 2 hours ago. The problem occurs constantly. The problem has been gradually improving. Pertinent negatives include no chest pain, no headaches and no shortness of breath. Nothing aggravates the symptoms. Nothing relieves the symptoms. She has tried nothing for the symptoms.    Past Medical History:  Diagnosis Date  . Anemia   . Arthritis   . CHF (congestive heart failure) ([place])   . Depression   . GERD (gastroesophageal reflux disease)   . Hemorrhoids   . Hyperlipidemia   . Hypertension   . Hypothyroidism   . Seasonal allergies   . Stroke ([place])   . Thyroid cancer ([place])    per pt S/p Total Thyroidectomy with Radioactive Iodine Therapy  . Type 1 diabetes mellitus [facility])     Patient Active Problem List   Diagnosis Date Noted  . Stroke (cerebrum) ([place]) 10/13/2018  . Folliculitis of left axilla 04/17/2018  . Frequent PVCs   . Controlled type 1 diabetes mellitus with hyperglycemia, with long-term current use of insulin ([place])   . BPPV (benign paroxysmal positional vertigo), right 06/13/2017  . Premature atrial beats 06/13/2017    . Snoring 07/31/2016  . Pre-ulcerative corn or callous 12/15/2015  . History of CHF (congestive heart failure) 10/29/2015  . Essential hypertension 10/29/2015  . 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 Toe injury 09/24/2015  . Thyroid activity decreased   . Intracranial vascular stenosis   . HLD (hyperlipidemia)   . 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 CVA (cerebral vascular accident) ([place]) 08/29/2015  . Special screening for malignant neoplasms, colon 09/10/2014  . Great toe pain 07/09/2014  . Posterior tibial tendinitis of right leg 04/26/2014  . Complicated UTI (urinary tract infection) 12/24/2013  . Morbid obesity ([place]) 05/29/2013  . Back pain without radiation 04/28/2013  . Nail dystrophy 01/10/2013  . Diabetes mellitus type I ([place]) 01/25/2012  . Hyperlipidemia 01/25/2012  . Hypothyroidism 01/25/2012  . Hypertension 01/25/2012  . GERD (gastroesophageal reflux disease) 01/25/2012    Past Surgical History:  Procedure Laterality Date  . ABDOMINAL ANGIOGRAM N/A 05/14/2012   Procedure: ABDOMINAL ANGIOGRAM;  Surgeon: Laverda Page, MD;  Location[facility] CATH LAB;  Service: Cardiovascular;  Laterality: N/A;  . ABDOMINAL HYSTERECTOMY  1995   partial  . ANKLE FRACTURE SURGERY Right   . CARDIAC CATHETERIZATION     with coronary angiogram  . COLONOSCOPY N/A 09/09/2014   Procedure: COLONOSCOPY;  Surgeon: Gatha Mayer, MD;  Location: [facility]  ENDOSCOPY;  Service: Endoscopy;  Laterality: N/A;  . LEFT AND RIGHT HEART CATHETERIZATION WITH CORONARY ANGIOGRAM N/A 05/14/2012   Procedure: LEFT AND RIGHT HEART CATHETERIZATION WITH CORONARY ANGIOGRAM;  Surgeon: Laverda Page, MD;  Location: [facility] CATH LAB;  Service: Cardiovascular;  Laterality: N/A;  . MINI METER   09/08/2014   ELECTRONIC INSULIN PUMP  . THYROIDECTOMY  2006     OB History   None      Home Medications    Prior to Admission medications    Medication Sig Start Date End Date Taking? Authorizing Provider  atorvastatin (LIPITOR) 40 MG tablet TAKE 1 TABLET(40 MG) BY MOUTH DAILY Patient taking differently: Take 40 mg by mouth daily at 6 PM.  09/30/18  Yes Enid Derry, [name], DO  Besifloxacin HCl (BESIVANCE) 0.6 % SUSP Place 1 drop into both eyes See admin instructions. 4 times daily for 2 days after eye injection   Yes [provider]  clopidogrel (PLAVIX) 75 MG tablet TAKE 1 TABLET BY MOUTH EVERY DAY Patient taking differently: Take 75 mg by mouth daily.  07/15/18  Yes Enid Derry, [name], DO  CRANBERRY PO Take 1 tablet by mouth daily.   Yes [provider]  fexofenadine (ALLEGRA) 30 MG tablet Take 30 mg by mouth daily as needed (allergies).    Yes [provider]  Insulin Human (INSULIN PUMP) SOLN Inject into the skin continuous. humalog insulin   Yes [provider]  levothyroxine (SYNTHROID, LEVOTHROID) 175 MCG tablet Take 175 mcg by mouth daily. 06/06/17  Yes [provider]  losartan-hydrochlorothiazide (HYZAAR) 100-12.5 MG tablet Take 1 tablet by mouth daily. 01/30/18  Yes Guadalupe Dawn, MD  multivitamin-lutein [facility]) CAPS capsule Take 1 capsule by mouth daily.   Yes [provider]  Potassium 99 MG TABS Take 99 mg by mouth daily.   Yes [provider]  ranitidine (ZANTAC) 150 MG tablet TAKE 1 TABLET BY MOUTH TWICE DAILY Patient taking differently: Take 150 mg by mouth 2 (two) times daily.  09/23/18  Yes Shirley, [name], DO    Family History Family History  Problem Relation Age of Onset  . Heart disease Mother        MI  . Hyperlipidemia Mother   . Hypertension Mother   . Renal Disease Mother        insufficiency  . Early death Father        Head Injury at Work  . Hyperlipidemia Father   . Hypertension Father   . Stroke Son   . Diabetes Maternal Aunt   . Prostate cancer Paternal Uncle   . Heart disease Maternal Aunt   . Breast cancer Neg Hx      Social History Social History   Tobacco Use  . Smoking status: Never Smoker  . Smokeless tobacco: Never Used  Substance Use Topics  . Alcohol use: No  . Drug use: No     Allergies   Patient has no known allergies.   Review of Systems Review of Systems  Constitutional: Negative for chills and fever.  HENT: Negative for congestion and rhinorrhea.   Eyes: Negative for redness and visual disturbance.  Respiratory: Negative for shortness of breath and wheezing.   Cardiovascular: Negative for chest pain and palpitations.  Gastrointestinal: Negative for nausea and vomiting.  Genitourinary: Negative for dysuria and urgency.  Musculoskeletal: Negative for arthralgias and myalgias.  Skin: Negative for pallor and wound.  Neurological: Positive for weakness. Negative for dizziness and headaches.     Physical Exam  Updated Vital Signs BP (!) 156/63 (BP Location: Right Arm)   Pulse (!) 59   Temp 97.9 F (36.6 C) (Oral)   Resp 20   Ht 5\' 7"  (1.702 m)   Wt 125.8 kg   SpO2 100%   BMI 43.44 kg/m   Physical Exam  Constitutional: She is oriented to person, place, and time. She appears well-developed and well-nourished. No distress.  HENT:  Head: Normocephalic and atraumatic.  Eyes: Pupils are equal, round, and reactive to light. EOM are normal.  Neck: Normal range of motion. Neck supple.  Cardiovascular: Normal rate and regular rhythm. Exam reveals no gallop and no friction rub.  No murmur heard. Pulmonary/Chest: Effort normal. She has no wheezes. She has no rales.  Abdominal: Soft. She exhibits no distension. There is no tenderness.  Musculoskeletal: She exhibits no edema or tenderness.  Neurological: She is alert and oriented to person, place, and time.  Left lower extremity weakness 4 out of 5 compared to right.  Bilateral upper extremities 5 out of 5.  Skin: Skin is warm and dry. She is not diaphoretic.  Psychiatric: She has a normal mood and affect. Her behavior is  normal.  Nursing note and vitals reviewed.    ED Treatments / Results  Labs (all labs ordered are listed, but only abnormal results are displayed) Labs Reviewed  CBC - Abnormal; Notable for the following components:      Result Value   Hemoglobin 11.1 (*)    All other components within normal limits  COMPREHENSIVE METABOLIC PANEL - Abnormal; Notable for the following components:   Glucose, Bld 312 (*)    Creatinine, Ser 1.04 (*)    Albumin 3.1 (*)    GFR calc non Af Amer 54 (*)    All other components within normal limits  CBG MONITORING, ED - Abnormal; Notable for the following components:   Glucose-Capillary 260 (*)    All other components within normal limits  I-STAT CHEM 8, ED - Abnormal; Notable for the following components:   Glucose, Bld 310 (*)    Calcium, Ion 1.14 (*)    Hemoglobin 11.9 (*)    HCT 35.0 (*)    All other components within normal limits  PROTIME-INR  APTT  DIFFERENTIAL  TSH  HIV ANTIBODY (ROUTINE TESTING W REFLEX)  HEMOGLOBIN A1C  LIPID PANEL  CBC  BASIC METABOLIC PANEL  I-STAT TROPONIN, ED    EKG None  Radiology Ct Head Code Stroke Wo Contrast  Result Date: 10/13/2018 CLINICAL DATA:  Code stroke.  Left-sided weakness. EXAM: CT HEAD WITHOUT CONTRAST TECHNIQUE: Contiguous axial images were obtained from the base of the skull through the vertex without intravenous contrast. COMPARISON:  04/03/2016 FINDINGS: Brain: No evidence of acute infarction, hemorrhage, hydrocephalus, extra-axial collection or mass lesion/mass effect. 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 Mild, age congruent cerebral volume loss. Vascular: Atherosclerotic calcification.  No hyperdense vessel. Skull: Normal. Negative for fracture or focal lesion. Sinuses/Orbits: No acute finding. Other: These results were communicated to Dr. Leonel Ramsay at 9:41 pmon 10/20/2019by text page via the [facility] messaging system. ASPECTS [facility] Stroke Program Early CT Score) - Ganglionic level  infarction (caudate, lentiform nuclei, internal capsule, insula, M1-M3 cortex): 7 - Supraganglionic infarction (M4-M6 cortex): 3 Total score (0-10 with 10 being normal): 10 IMPRESSION: 1. No acute finding.  ASPECTS is 10. 2. 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 Electronically Signed   By: Monte Fantasia M.D.   On: 10/13/2018 21:43    Procedures Procedures (  including critical care time)  Medications Ordered in ED Medications  atorvastatin (LIPITOR) tablet 40 mg (has no administration in time range)  insulin pump (has no administration in time range)  levothyroxine (SYNTHROID, LEVOTHROID) tablet 175 mcg (has no administration in time range)  famotidine (PEPCID) tablet 10 mg (has no administration in time range)  clopidogrel (PLAVIX) tablet 75 mg (has no administration in time range)   stroke: mapping our early stages of recovery book (has no administration in time range)  0.9 %  sodium chloride infusion (has no administration in time range)  acetaminophen (TYLENOL) tablet 650 mg (has no administration in time range)    Or  acetaminophen (TYLENOL) solution 650 mg (has no administration in time range)    Or  acetaminophen (TYLENOL) suppository 650 mg (has no administration in time range)  enoxaparin (LOVENOX) injection 40 mg (has no administration in time range)  senna-docusate (Senokot-S) tablet 1 tablet (has no administration in time range)  aspirin suppository 300 mg (has no administration in time range)    Or  aspirin tablet 325 mg (has no administration in time range)     Initial Impression / Assessment and Plan / ED Course  I have reviewed the triage vital signs and the nursing notes.  Pertinent labs & imaging results that were available during my care of the patient were reviewed by me and considered in my medical decision making (see chart for details).     67 yo F arrived to the ED as a code stroke.  She had new onset left-sided weakness.  She was seen urgently by  neurology.  They felt that her NIH stroke score was too low to give TPA.  Recommended hospitalist admission.  The patients results and plan were reviewed and discussed.   Any x-rays performed were independently reviewed by myself.   Differential diagnosis were considered with the presenting HPI.  Medications  atorvastatin (LIPITOR) tablet 40 mg (has no administration in time range)  insulin pump (has no administration in time range)  levothyroxine (SYNTHROID, LEVOTHROID) tablet 175 mcg (has no administration in time range)  famotidine (PEPCID) tablet 10 mg (has no administration in time range)  clopidogrel (PLAVIX) tablet 75 mg (has no administration in time range)   stroke: mapping our early stages of recovery book (has no administration in time range)  0.9 %  sodium chloride infusion (has no administration in time range)  acetaminophen (TYLENOL) tablet 650 mg (has no administration in time range)    Or  acetaminophen (TYLENOL) solution 650 mg (has no administration in time range)    Or  acetaminophen (TYLENOL) suppository 650 mg (has no administration in time range)  enoxaparin (LOVENOX) injection 40 mg (has no administration in time range)  senna-docusate (Senokot-S) tablet 1 tablet (has no administration in time range)  aspirin suppository 300 mg (has no administration in time range)    Or  aspirin tablet 325 mg (has no administration in time range)    Vitals:   10/13/18 2215 10/13/18 2230 10/13/18 2300 10/13/18 2326  BP: (!) 169/60 (!) 184/71 (!) 116/55 (!) 156/63  Pulse: 76  72 (!) 59  Resp: 15 (!) 23 18 20   Temp:    97.9 F (36.6 C)  TempSrc:    Oral  SpO2: 100%  100% 100%  Weight:    125.8 kg  Height:    5\' 7"  (1.702 m)    Final diagnoses:  Stroke (cerebrum) (HCC)    Admission/ observation were discussed with the  admitting physician, patient and/or family and they are comfortable with the plan.    Final Clinical Impressions(s) / ED Diagnoses   Final diagnoses:   Stroke (cerebrum) [facility])    ED Discharge Orders    None       Deno Etienne, DO 10/14/18 0003

## 2018-10-13 NOTE — ED Triage Notes (Signed)
Patient [name] from home for life sided weakness. Patient states "I woke up around 2030 from a nap and I could hardly walk because my LEFT side felt so weak. I use a walker at home for my back problems and I had trouble walking with it". Reports history of CVA 2 years ago, currently taking Plavix. Denies chest pain, SOB, abdominal pain, and dysuria.

## 2018-10-13 NOTE — ED Notes (Signed)
MD Leonel Ramsay made treatment decision. Patient "too good to treat", will continue q1min vitals and q36min neuro checks until 0100 on 10/14/2018.

## 2018-10-14 ENCOUNTER — Inpatient Hospital Stay (HOSPITAL_COMMUNITY): Payer: Medicare Other

## 2018-10-14 ENCOUNTER — Encounter (INDEPENDENT_AMBULATORY_CARE_PROVIDER_SITE_OTHER): Payer: Medicare Other | Admitting: Ophthalmology

## 2018-10-14 ENCOUNTER — Encounter (HOSPITAL_COMMUNITY): Payer: Self-pay | Admitting: Radiology

## 2018-10-14 DIAGNOSIS — E785 Hyperlipidemia, unspecified: Secondary | ICD-10-CM

## 2018-10-14 DIAGNOSIS — I63311 Cerebral infarction due to thrombosis of right middle cerebral artery: Secondary | ICD-10-CM

## 2018-10-14 DIAGNOSIS — E1169 Type 2 diabetes mellitus with other specified complication: Secondary | ICD-10-CM

## 2018-10-14 DIAGNOSIS — I639 Cerebral infarction, unspecified: Secondary | ICD-10-CM

## 2018-10-14 DIAGNOSIS — R931 Abnormal findings on diagnostic imaging of heart and coronary circulation: Secondary | ICD-10-CM

## 2018-10-14 DIAGNOSIS — Z8673 Personal history of transient ischemic attack (TIA), and cerebral infarction without residual deficits: Secondary | ICD-10-CM

## 2018-10-14 DIAGNOSIS — E1159 Type 2 diabetes mellitus with other circulatory complications: Secondary | ICD-10-CM

## 2018-10-14 DIAGNOSIS — Z794 Long term (current) use of insulin: Secondary | ICD-10-CM

## 2018-10-14 LAB — CBC
HEMATOCRIT: 33.3 % — AB (ref 36.0–46.0)
Hemoglobin: 10.3 g/dL — ABNORMAL LOW (ref 12.0–15.0)
MCH: 27.8 pg (ref 26.0–34.0)
MCHC: 30.9 g/dL (ref 30.0–36.0)
MCV: 89.8 fL (ref 80.0–100.0)
NRBC: 0 % (ref 0.0–0.2)
PLATELETS: 228 10*3/uL (ref 150–400)
RBC: 3.71 MIL/uL — ABNORMAL LOW (ref 3.87–5.11)
RDW: 12.1 % (ref 11.5–15.5)
WBC: 4.7 10*3/uL (ref 4.0–10.5)

## 2018-10-14 LAB — GLUCOSE, CAPILLARY
GLUCOSE-CAPILLARY: 234 mg/dL — AB (ref 70–99)
GLUCOSE-CAPILLARY: 199 mg/dL — AB (ref 70–99)
GLUCOSE-CAPILLARY: 205 mg/dL — AB (ref 70–99)
GLUCOSE-CAPILLARY: 282 mg/dL — AB (ref 70–99)
GLUCOSE-CAPILLARY: 245 mg/dL — AB (ref 70–99)
GLUCOSE-CAPILLARY: 209 mg/dL — AB (ref 70–99)
Glucose-Capillary: 155 mg/dL — ABNORMAL HIGH (ref 70–99)

## 2018-10-14 LAB — RAPID URINE DRUG SCREEN, HOSP PERFORMED
Amphetamines: NOT DETECTED
BARBITURATES: NOT DETECTED
Benzodiazepines: NOT DETECTED
Cocaine: NOT DETECTED
Opiates: NOT DETECTED
TETRAHYDROCANNABINOL: NOT DETECTED

## 2018-10-14 LAB — LIPID PANEL
CHOL/HDL RATIO: 2.2 ratio
CHOLESTEROL: 107 mg/dL (ref 0–200)
HDL: 48 mg/dL (ref >40)
LDL Cholesterol: 48 mg/dL (ref 0–99)
Triglycerides: 53 mg/dL (ref <150)
VLDL: 11 mg/dL (ref 0–40)

## 2018-10-14 LAB — BASIC METABOLIC PANEL
Anion gap: 7 (ref 5–15)
BUN: 11 mg/dL (ref 8–23)
CALCIUM: 8.9 mg/dL (ref 8.9–10.3)
CO2: 25 mmol/L (ref 22–32)
CREATININE: 0.85 mg/dL (ref 0.44–1.00)
Chloride: 109 mmol/L (ref 98–111)
GFR calc Af Amer: >60 mL/min (ref >60)
GLUCOSE: 257 mg/dL — AB (ref 70–99)
Potassium: 3.7 mmol/L (ref 3.5–5.1)
Sodium: 141 mmol/L (ref 135–145)

## 2018-10-14 LAB — PLATELET FUNCTION ASSAY
Collagen / ADP: 119 seconds — ABNORMAL HIGH (ref 0–118)
Collagen / Epinephrine: 220 seconds — ABNORMAL HIGH (ref 0–193)

## 2018-10-14 LAB — ECHOCARDIOGRAM COMPLETE
HEIGHTINCHES: 67 in
WEIGHTICAEL: 4437.42 [oz_av]

## 2018-10-14 LAB — TSH: TSH: 2.694 u[IU]/mL (ref 0.350–4.500)

## 2018-10-14 LAB — HIV ANTIBODY (ROUTINE TESTING W REFLEX): HIV Screen 4th Generation wRfx: NONREACTIVE

## 2018-10-14 MED ORDER — LORAZEPAM 2 MG/ML IJ SOLN
1.0000 mg | Freq: Once | INTRAMUSCULAR | Status: AC
  Administered 2018-10-14: 1 mg via INTRAVENOUS
  Filled 2018-10-14: qty 1

## 2018-10-14 MED ORDER — IOPAMIDOL (ISOVUE-370) INJECTION 76%
INTRAVENOUS | Status: AC
  Filled 2018-10-14: qty 50

## 2018-10-14 MED ORDER — IOPAMIDOL (ISOVUE-370) INJECTION 76%
50.0000 mL | Freq: Once | INTRAVENOUS | Status: AC | PRN
  Administered 2018-10-14: 50 mL via INTRAVENOUS

## 2018-10-14 MED ORDER — PERFLUTREN LIPID MICROSPHERE
1.0000 mL | INTRAVENOUS | Status: AC | PRN
  Administered 2018-10-14: 4.5 mL via INTRAVENOUS
  Filled 2018-10-14: qty 10

## 2018-10-14 MED ORDER — ATORVASTATIN CALCIUM 80 MG PO TABS
80.0000 mg | ORAL_TABLET | Freq: Every day | ORAL | Status: DC
  Administered 2018-10-15 – 2018-10-16 (×2): 80 mg via ORAL
  Filled 2018-10-14 (×2): qty 1

## 2018-10-14 NOTE — Progress Notes (Addendum)
STROKE TEAM PROGRESS NOTE   INTERVAL HISTORY Her family is at the bedside.  [name] is sitting up in the chair at the bedside. Unable to tolerate MRI d/t claustrophobia. RN has contacted attending for sedative and [name] plans to try again.   Vitals:   10/14/18 0230 10/14/18 0326 10/14/18 0500 10/14/18 0933  BP: 130/61 (!) 146/62 (!) 160/60 (!) 160/91  Pulse: 74 77 69 67  Resp: 20 20 20 18   Temp:  98.1 F (36.7 C)  98.5 F (36.9 C)  TempSrc:  Oral  Oral  SpO2: 99% 98% 99% 100%  Weight:      Height:        CBC:  Recent Labs  Lab 10/13/18 2131 10/13/18 2135 10/14/18 0439  WBC 5.3  --  4.7  NEUTROABS 2.7  --   --   HGB 11.1* 11.9* 10.3*  HCT 36.6 35.0* 33.3*  MCV 92.4  --  89.8  PLT 231  --  086    Basic Metabolic Panel:  Recent Labs  Lab 10/13/18 2131 10/13/18 2135 10/14/18 0439  NA 136 140 141  K 3.5 3.7 3.7  CL 101 102 109  CO2 24  --  25  GLUCOSE 312* 310* 257*  BUN 14 16 11   CREATININE 1.04* 0.90 0.85  CALCIUM 8.9  --  8.9   Lipid Panel:     Component Value Date/Time   CHOL 107 10/14/2018 0439   TRIG 53 10/14/2018 0439   HDL 48 10/14/2018 0439   CHOLHDL 2.2 10/14/2018 0439   VLDL 11 10/14/2018 0439   LDLCALC 48 10/14/2018 0439   HgbA1c:  Lab Results  Component Value Date   HGBA1C 9.4 10/19/2017   Urine Drug Screen:     Component Value Date/Time   LABOPIA NONE DETECTED 10/14/2018 0642   COCAINSCRNUR NONE DETECTED 10/14/2018 0642   LABBENZ NONE DETECTED 10/14/2018 0642   AMPHETMU NONE DETECTED 10/14/2018 0642   THCU NONE DETECTED 10/14/2018 0642   LABBARB NONE DETECTED 10/14/2018 0642    Alcohol Level No results found for: ETH  IMAGING Ct Angio Head W Or Wo Contrast  Result Date: 10/14/2018 CLINICAL DATA:  67 year old female code stroke presentation yesterday with left side deficits. EXAM: CT ANGIOGRAPHY HEAD AND NECK TECHNIQUE: Multidetector CT imaging of the head and neck was performed using the standard protocol during bolus administration  of intravenous contrast. Multiplanar CT image reconstructions and MIPs were obtained to evaluate the vascular anatomy. Carotid stenosis measurements (when applicable) are obtained utilizing NASCET criteria, using the distal internal carotid diameter as the denominator. CONTRAST:  87mL ISOVUE-370 IOPAMIDOL (ISOVUE-370) INJECTION 76% COMPARISON:  Head CT without contrast 10/13/2018. CTA head and neck 08/29/2015 and 08/30/2015. FINDINGS: CTA NECK Skeleton: No acute osseous abnormality identified. Visualized paranasal sinuses and mastoids are stable and well pneumatized. Largely absent dentition. Upper chest: Negative upper lungs. No superior mediastinal lymphadenopathy. Other neck: Surgically absent thyroid as before. Otherwise negative. Aortic arch: Mild Calcified aortic atherosclerosis. Bovine type arch configuration. Stable great vessel origins since 2016 with no origin stenosis. Right carotid system: Stable mild tortuosity of the proximal right CCA. Minimal right CCA and right carotid bifurcation plaque with no right carotid stenosis in the neck. Mildly tortuous cervical right ICA. Left carotid system: Mild left CCA tortuosity. Chronic left CCA mostly calcified plaque just above the level of the thyroid with no stenosis, stable. Mild mostly calcified plaque at the left carotid bifurcation is stable with no stenosis. Vertebral arteries: No proximal right subclavian artery  stenosis. Calcified plaque near the right vertebral artery origin, but no origin stenosis. Mildly tortuous right vertebral artery is patent to the skull base without stenosis. No proximal left subclavian artery stenosis despite soft and calcified plaque. Similar mild soft and calcified plaque near the left vertebral artery origin but no origin stenosis. Patent and occasionally tortuous left vertebral artery to the skull base without stenosis. CTA HEAD Posterior circulation: Moderate to severe bilateral V4 segment calcified plaque is re-  demonstrated with associated moderate to severe bilateral V4 segment stenosis distal to both PICA origins which remain patent (series 7, image 137 on the left and image 132 on the right). The distal V4 segments then remain normal. Patent vertebrobasilar junction and basilar artery without stenosis. SCA and PCA origins are patent and within normal limits. Small left posterior communicating artery is present. The right is diminutive or absent. Bilateral PCA branches are within normal limits. Anterior circulation: Moderate to severe bilateral ICA siphon calcified atherosclerosis redemonstrated. Both ophthalmic artery origins remain patent. Hemodynamically significant stenosis on the right suspected at the junction of the vertical petrous and cavernous segments, with moderate right supraclinoid the stenosis. These segments appears stable. On the left side hemodynamically significant siphon stenosis is suspected in the proximal and distal cavernous segment. The segments appears stable. Carotid termini remain patent. MCA and ACA origins are normal. Anterior communicating artery and bilateral ACA branches are stable and within normal limits. Left MCA M1 segment, bifurcation, and left MCA branches are stable and within normal limits. Right MCA M1 segment, bifurcation, and right MCA branches are stable and within normal limits. Venous sinuses: Patent. Anatomic variants: Bovine type aortic arch configuration. Delayed phase: Gray-white matter differentiation in the right hemisphere peers stable and within normal limits. No acute or evolving infarct is identified. No intracranial mass effect, ventriculomegaly, or hemorrhage identified. No abnormal enhancement identified. Review of the MIP images confirms the above findings IMPRESSION: 1. 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 There is  comparatively little extracranial atherosclerosis, and no plaque or stenosis identified in the circle of Willis branches. 2. Stable CT appearance of the brain since yesterday. No acute or evolving infarct identified. Electronically Signed   By: Genevie Ann M.D.   On: 10/14/2018 09:27   Dg Chest 2 View  Result Date: 10/14/2018 CLINICAL DATA:  Stroke x last night around 830pm, left sided numbness from neck down to her feet, HTN, diabetic, nonsmoker EXAM: CHEST - 2 VIEW COMPARISON:  04/03/2016 FINDINGS: Central pulmonary vascular congestion. No confluent airspace disease. Moderate cardiomegaly stable.  Aortic Atherosclerosis (ICD10-170.0). No effusion. Osteopenia with stable mild midthoracic compression deformity. IMPRESSION: Stable cardiomegaly and central pulmonary vascular Electronically Signed   By: Lucrezia Europe M.D.   On: 10/14/2018 08:45   Ct Angio Neck W Or Wo Contrast  Result Date: 10/14/2018 CLINICAL DATA:  67 year old female code stroke presentation yesterday with left side deficits. EXAM: CT ANGIOGRAPHY HEAD AND NECK TECHNIQUE: Multidetector CT imaging of the head and neck was performed using the standard protocol during bolus administration of intravenous contrast. Multiplanar CT image reconstructions and MIPs were obtained to evaluate the vascular anatomy. Carotid stenosis measurements (when applicable) are obtained utilizing NASCET criteria, using the distal internal carotid diameter as the denominator. CONTRAST:  71mL ISOVUE-370 IOPAMIDOL (ISOVUE-370) INJECTION 76% COMPARISON:  Head CT without contrast 10/13/2018. CTA head and neck 08/29/2015 and  08/30/2015. FINDINGS: CTA NECK Skeleton: No acute osseous abnormality identified. Visualized paranasal sinuses and mastoids are stable and well pneumatized. Largely absent dentition. Upper chest: Negative upper lungs. No superior mediastinal lymphadenopathy. Other neck: Surgically absent thyroid as before. Otherwise negative. Aortic arch: Mild Calcified  aortic atherosclerosis. Bovine type arch configuration. Stable great vessel origins since 2016 with no origin stenosis. Right carotid system: Stable mild tortuosity of the proximal right CCA. Minimal right CCA and right carotid bifurcation plaque with no right carotid stenosis in the neck. Mildly tortuous cervical right ICA. Left carotid system: Mild left CCA tortuosity. Chronic left CCA mostly calcified plaque just above the level of the thyroid with no stenosis, stable. Mild mostly calcified plaque at the left carotid bifurcation is stable with no stenosis. Vertebral arteries: No proximal right subclavian artery stenosis. Calcified plaque near the right vertebral artery origin, but no origin stenosis. Mildly tortuous right vertebral artery is patent to the skull base without stenosis. No proximal left subclavian artery stenosis despite soft and calcified plaque. Similar mild soft and calcified plaque near the left vertebral artery origin but no origin stenosis. Patent and occasionally tortuous left vertebral artery to the skull base without stenosis. CTA HEAD Posterior circulation: Moderate to severe bilateral V4 segment calcified plaque is re- demonstrated with associated moderate to severe bilateral V4 segment stenosis distal to both PICA origins which remain patent (series 7, image 137 on the left and image 132 on the right). The distal V4 segments then remain normal. Patent vertebrobasilar junction and basilar artery without stenosis. SCA and PCA origins are patent and within normal limits. Small left posterior communicating artery is present. The right is diminutive or absent. Bilateral PCA branches are within normal limits. Anterior circulation: Moderate to severe bilateral ICA siphon calcified atherosclerosis redemonstrated. Both ophthalmic artery origins remain patent. Hemodynamically significant stenosis on the right suspected at the junction of the vertical petrous and cavernous segments, with moderate  right supraclinoid the stenosis. These segments appears stable. On the left side hemodynamically significant siphon stenosis is suspected in the proximal and distal cavernous segment. The segments appears stable. Carotid termini remain patent. MCA and ACA origins are normal. Anterior communicating artery and bilateral ACA branches are stable and within normal limits. Left MCA M1 segment, bifurcation, and left MCA branches are stable and within normal limits. Right MCA M1 segment, bifurcation, and right MCA branches are stable and within normal limits. Venous sinuses: Patent. Anatomic variants: Bovine type aortic arch configuration. Delayed phase: Gray-white matter differentiation in the right hemisphere peers stable and within normal limits. No acute or evolving infarct is identified. No intracranial mass effect, ventriculomegaly, or hemorrhage identified. No abnormal enhancement identified. Review of the MIP images confirms the above findings IMPRESSION: 1. 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 There is comparatively little extracranial atherosclerosis, and no plaque or stenosis identified in the circle of Willis branches. 2. Stable CT appearance of the brain since yesterday. No acute or evolving infarct identified. Electronically Signed   By: Genevie Ann M.D.   On: 10/14/2018 09:27   Ct Head Code Stroke Wo Contrast  Result Date: 10/13/2018 CLINICAL DATA:  Code stroke.  Left-sided weakness. EXAM: CT HEAD WITHOUT CONTRAST TECHNIQUE: Contiguous axial images were obtained from the base of the skull through the vertex without intravenous contrast. COMPARISON:  04/03/2016 FINDINGS: Brain: No evidence of acute infarction,  hemorrhage, hydrocephalus, extra-axial collection or mass lesion/mass effect. 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 Mild, age congruent cerebral volume  loss. Vascular: Atherosclerotic calcification.  No hyperdense vessel. Skull: Normal. Negative for fracture or focal lesion. Sinuses/Orbits: No acute finding. Other: These results were communicated to Dr. Leonel Ramsay at 9:41 pmon 10/20/2019by text page via the [facility] messaging system. ASPECTS [facility] Stroke Program Early CT Score) - Ganglionic level infarction (caudate, lentiform nuclei, internal capsule, insula, M1-M3 cortex): 7 - Supraganglionic infarction (M4-M6 cortex): 3 Total score (0-10 with 10 being normal): 10 IMPRESSION: 1. No acute finding.  ASPECTS is 10. 2. 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 Electronically Signed   By: Monte Fantasia M.D.   On: 10/13/2018 21:43    PHYSICAL EXAM Constitutional: Appears well-developed and well-nourished.  Psych: Affect appropriate to situation Eyes: No scleral injection HENT: No OP obstrucion Head: Normocephalic.  Cardiovascular: Normal rate and regular rhythm.  Respiratory: Effort normal, non-labored breathing Skin: WDI  Neuro: Mental Status: Patient is awake, alert, oriented to person, place, month, year, and situation. Patient is able to give a clear and coherent history. No signs of aphasia or neglect Cranial Nerves: II: Visual Fields are full. Pupils are equal, round, and reactive to light.   III,IV, VI: EOMI without ptosis or diploplia.  V: Facial sensation is symmetric to temperature VII: Facial movement is symmetric. Decreased sensation just behind L ear extending to upper neck  VIII: hearing is intact to voice X: Uvula elevates symmetrically XI: Shoulder shrug is symmetric. XII: tongue is midline without atrophy or fasciculations.  Motor: Tone is normal. Bulk is normal. 5/5 strength was present in bilateral arm and the right leg, [name] has 4+/5 strength in the left leg.  Sensory: Sensation is diminished in the left arm and leg. no extinction/neglect Cerebellar: FNF intact bilaterally  ASSESSMENT/PLAN [name] is a 67 y.o. female with history of HTN, HLD, prior stroke, CHF, type I DB, thyroid cancer presenting with L hemiparesis and L sided numbness.   Stroke:  R brain infarct, workup underway  Code Stroke CT head No acute stroke. Old L pontine lacune. ASPECTS 10.     CTA head & neck stable since 2016. High grade stenosis B ICA siphons and B distal VA V4.  MRI  Pending. To retry using seduation  2D Echo  pending   LDL 48  HgbA1c pending   HIV pending   Lovenox 40 mg sq daily for VTE prophylaxis  clopidogrel 75 mg daily prior to admission, now on aspirin 325 mg daily and clopidogrel 75 mg daily. Discharge recommendations pending MRI results  Therapy recommendations:  HH PT  Disposition:  pending   Hypertension  Stable . Permissive hypertension (OK if < 220/120) but gradually normalize in 5-7 days . 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   Hypothyroidism On synthroid.  TSH normal.  GERD  Uterine fibroids s/p hysterectomy  Hx PACs, referred to cardiology, but has not seen them  Hospital day # [place], MSN, APRN, ANVP-BC, AGPCNP-BC Advanced Practice Stroke Nurse [place] for Schedule & Pager information 10/14/2018 11:50 AM   ATTENDING NOTE: I reviewed above note and agree with the assessment and plan. Pt was seen and examined.    67 year old female with history of hypertension, diabetes on insulin pump, hyperlipidemia, CHF, thyroid cancer status post total thyroidectomy, previous stroke in 2016 admitted for left-sided weakness numbness.  [name] had a stroke on 08/29/2015, MRI showed small dorsal brainstem infarct.  CTA head and neck showed bilateral siphon stenosis and bilateral VA stenosis.  EF 55 to 60%.  LDL 61 and A1c 9.1.  [name] was put on DAPT and statin and later switched to Plavix alone.  Over the time, patient symptoms resolved, and stated that her glucose gain better control earlier this year after switching to a different insulin pump.  However on admission, her sugar still at the 300s.  Her symptoms overnight on the left side weakness numbness much improved.  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  EF 60 to 65%. LDL 48 and A1c pending.  UDS negative.  MRI pending  On examination, AAO x3, no aphasia, no dysarthria, following commands.  No significant facial droop, tongue midline.  Visual field fall, PERRL, EOMI.  Moving all extremities well except left hand with mild dexterity difficulty and dysmetria.  With left-sided hemiparesthesia, 80% comparing to right.  Patient stroke concerning for small vessel disease given uncontrolled risk factors and previous small vessel disease.  Currently on aspirin Plavix and Lipitor.  Continue for now.  Will follow.  Rosalin Hawking, MD PhD Stroke Neurology 10/14/2018 5:49 PM    To contact Stroke Continuity provider, please refer to [email or web address] After hours, contact General Neurology

## 2018-10-14 NOTE — Progress Notes (Signed)
  Echocardiogram 2D Echocardiogram has been performed.  Johny Chess 10/14/2018, 2:13 PM

## 2018-10-14 NOTE — Progress Notes (Signed)
Family Medicine Teaching Service Daily Progress Note Intern Pager: [phone]  Patient name: [name] Medical record number: [id number] Date of birth: 1951-09-03 Age: 67 y.o. Gender: female  Primary Care Provider: Shirley, [name], DO Consultants: Neurology Code Status: Full code  Pt Overview and Major Events to Date:  10/13/2018 Admitted 10/13/2018 CT head without contrast 10/14/2018 Echo, CTA head and neck Hospital Day: 2   Assessment and Plan: Estephany Perot is a 67 y.o. female presenting with new stroke. PMH is significant for previous CVA (2016), T1DM, HLD, Hypothyroidism, HTN, GERD.   TIA vs Stroke: new, improved CT head neg for acute findings (ASPECTS 10), chronic lacune in upper left pons. MR brain and Echo pending. No tPA given per neurology as symptoms had improved en route to hospital. Previous stroke in 2016 with infarct in dorsal brainstem region of medial lemniscus without residual neurological deficits. BP elevated on admission, BP overnight 130-187/60's. BP this AM 146/62. Permissive HTN. Neuro exam this morning was unchanged with decreased sensory on left with 5/5 strength bilaterally. CN II-XII intact. Overnight patient was hemodynamically stable on RA and afebrile. Lipids WNL with LDL 48. Hgb 11.1>10.3 and Hct 36.6>33.3. BNP WNL. TSH 2.694.  A1C pending. Will follow up. Since patient had stroke while on Plavix, will increase atorvastatin dose. Will also consider P2Y12 Platelet function testing to determine if patient is resistant to Plavix. Consider switch from Plavix to Brilinta. - Neurology consulted, will continue to follow - Follow up Echo - Follow up MR brain - Follow up A1C - Permissive HTN for 24 hrs  -Consider restarting HTN meds tomorrow - Telemetry and continuous pulse ox - Continue ASA 325 + plavix 75mg  x 3 weeks then monotherapy  - Will continue to follow neuro recs - Increase Atorvastatin 40 to 80mg  QD - Follow up PT/OT/SLP  consult - AM CBC and BMP  Anemia: chronic Hgb 11.1>10.3. Baseline 11. MCV 92.4>89.8.  - Will continue to monitor.  HTN Currently allowing permissive HTN. 184/71 on admission. BP overnight 130-187/60's. BP this AM 146/62. Takes losartan-HCTZ 100-12.5mg  daily at home. Will gradually improve BP as 24 hour window ends. Consider adding BP medicine tomorrow pending overnight BP's. - hold home meds to allow permissive HTN - continue to monitor   HLD Lipids WNL, LDL 48. Takes atorvastatin 40mg  daily. Will increase Atorvastatin due to CVA history and risk reduction. - Increase Atorvastatin from 40mg  to 80mg  QD  Type 1 Diabetes Followed by [name] with Endocrinology, patient with frequent visits every 6 months. Next appointment 10/2018. On insulin pump. CBG on admission 312. BG ON 155-260. Per chart review patient with poor control and elevated A1Cs. Pt has follow-up with [name] next month to discuss pump settings and targets. - continue insulin pump  - Follow-up A1C - monitor CBGs closely  - Follow-up with endocrinologist outpatient for adjustements  Irregular Heart Beat Noted during previous hospitalization and described as sinus with PACs. Previously referred to Cardiology, per patient she has not seen them.  - continuous cardiac monitoring  - can consider consult to cardiology if symptomatic   GERD Home meds: zantac 150mg  bid - pepcid while inpatient as zantac not on formulary   Thyroid Cancer S/p thyroidectomy 2006. Currently on synthroid 175 mcg daily. TSH 2.694. - continue home meds   Uterine Fibroids S/p hysterectomy  Fluids: Saline lock . sodium chloride 100 mL/hr at 10/14/18 0011  Electrolytes: Replace PRN  Nutrition: NPO pending swallow study GI ppx: Pepcid DVT ppx: Lovenox  Future labs: CBC, CMP Disposition: Home pending improvement   Medications: Scheduled Meds: .  stroke: mapping our early stages of recovery book   Does not apply Once  . aspirin  300  mg Rectal Daily   Or  . aspirin  325 mg Oral Daily  . atorvastatin  40 mg Oral Daily  . clopidogrel  75 mg Oral Daily  . enoxaparin (LOVENOX) injection  40 mg Subcutaneous Q24H  . famotidine  10 mg Oral Daily  . insulin pump   Subcutaneous TID AC, HS, 0200  . levothyroxine  175 mcg Oral QAC breakfast   Continuous Infusions: . sodium chloride 100 mL/hr at 10/14/18 0011   PRN Meds: acetaminophen **OR** acetaminophen (TYLENOL) oral liquid 160 mg/5 mL **OR** acetaminophen, senna-docusate  ================================================= ================================================= Subjective:  Patient reports doing well overnight. Pt has had good appetite and normal voids and bowel movements. She reports still feeling numb on her left side. Denies any headaches or changes in vision. Notes she does feel a little less steady when she walk. She can tell she is a little weaker than she was yesterday prior to admission.   Objective: Vital Signs Temp:  [97.9 F (36.6 C)-98.1 F (36.7 C)] 98.1 F (36.7 C) (10/21 0326) Pulse Rate:  [59-77] 77 (10/21 0326) Resp:  [15-23] 20 (10/21 0326) BP: (116-187)/(55-73) 146/62 (10/21 0326) SpO2:  [98 %-100 %] 98 % (10/21 0326) Weight:  [125.8 kg-127.2 kg] 125.8 kg (10/20 2326)  Intake/Output No intake/output data recorded.  Physical Exam:  Gen: NAD, alert, non-toxic, well-appearing, sitting comfortably in bed Skin: Warm and dry. No obvious rashes, lesions, or trauma. HEENT: NCAT, PERRLA, EOMI, No conjunctival pallor or injection. No scleral icterus or injection.  MMM.  CV: RRR. <2s capillary refill bilaterally.  RP & DPs 2+ bilaterally. No BLEE. Resp: CTAB.  No wheezing, rales, abnormal lung sounds.  No increased WOB Abd: NTND on palpation to all 4 quadrants.  Positive bowel sounds. Psych: Cooperative with exam. Pleasant. Makes eye contact. Speech normal. Extremities: Moves all extremities spontaneously  Neuro: CN II-XII grossly intact. No  FNDs. 5/5 strength bilaterally. Decreased sensation noted along mandibular portion of C5 and along upper aspect of UE and along entire LE.   Laboratory: Recent Labs  Lab 10/13/18 2131 10/13/18 2135  WBC 5.3  --   HGB 11.1* 11.9*  HCT 36.6 35.0*  PLT 231  --    Recent Labs  Lab 10/13/18 2131 10/13/18 2135  NA 136 140  K 3.5 3.7  CL 101 102  CO2 24  --   BUN 14 16  CREATININE 1.04* 0.90  CALCIUM 8.9  --   PROT 6.8  --   BILITOT 0.5  --   ALKPHOS 74  --   ALT 22  --   AST 22  --   GLUCOSE 312* 310*    Imaging/Diagnostic Tests: Ct Head Code Stroke Wo Contrast  Result Date: 10/13/2018 CLINICAL DATA:  Code stroke.  Left-sided weakness. EXAM: CT HEAD WITHOUT CONTRAST TECHNIQUE: Contiguous axial images were obtained from the base of the skull through the vertex without intravenous contrast. COMPARISON:  04/03/2016 FINDINGS: Brain: No evidence of acute infarction, hemorrhage, hydrocephalus, extra-axial collection or mass lesion/mass effect. 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 Mild, age congruent cerebral volume loss. Vascular: Atherosclerotic calcification.  No hyperdense vessel. Skull: Normal. Negative for fracture or focal lesion. Sinuses/Orbits: No acute finding. Other: These results were communicated to Dr. Leonel Ramsay at 9:41 pmon 10/20/2019by text page via the [facility]  messaging system. ASPECTS [facility] Stroke Program Early CT Score) - Ganglionic level infarction (caudate, lentiform nuclei, internal capsule, insula, M1-M3 cortex): 7 - Supraganglionic infarction (M4-M6 cortex): 3 Total score (0-10 with 10 being normal): 10 IMPRESSION: 1. No acute finding.  ASPECTS is 10. 2. 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 Electronically Signed   By: Monte Fantasia M.D.   On: 10/13/2018 21:43    Danna Hefty, DO 10/14/2018, 5:54 AM PGY-1, [place] Intern pager: [phone], text pages welcome

## 2018-10-14 NOTE — Evaluation (Signed)
Physical Therapy Evaluation Patient Details Name: [name] MRN: [id number] DOB: [birth year] Today's Date: 10/14/2018   History of Present Illness  Patient is a 67 y/o female presenting to the ED on 10/13/18 wiht primary complaints of L sided weakness. CT head showing no acute findings. MRI pending. PMH significant for previous stroke, T1DM, HLD, Hypothyroidism, HTN, GERD.     Clinical Impression  [name] is a very pleasant 67 y/o female admitted with the above listed diagnosis. Patient reports that prior to admission she was Mod I with all mobility with use of rollator. Patient today reporting reduced sensation of L LE, however with eyes closed able to correctly identify all touches to L LE by PT. With gait, noted poor heel strike and toe off - may be more of a proprioception issue? PT to recommend HHPT at discharge to progress safe and independent functional mobility. PT to follow acutely.     Follow Up Recommendations Home health PT;Supervision - Intermittent    Equipment Recommendations  None recommended by PT    Recommendations for Other Services OT consult     Precautions / Restrictions Precautions Precautions: Fall Restrictions Weight Bearing Restrictions: No      Mobility  Bed Mobility               General bed mobility comments: seated EOB  Transfers Overall transfer level: Needs assistance Equipment used: Rolling walker (2 wheeled) Transfers: Sit to/from Stand Sit to Stand: Min guard;Supervision         General transfer comment: for safety and immediate standing balance  Ambulation/Gait Ambulation/Gait assistance: Min guard Gait Distance (Feet): 150 Feet Assistive device: Rolling walker (2 wheeled) Gait Pattern/deviations: Step-to pattern;Step-through pattern;Decreased stride length;Decreased stance time - left;Decreased weight shift to left;Decreased dorsiflexion - left;Trunk flexed Gait velocity: decreased   General Gait Details:  poor heel strike and toe off - reports reduced sensation - may be more of a proprioception issue  Stairs            Wheelchair Mobility    Modified Rankin (Stroke Patients Only) Modified Rankin (Stroke Patients Only) Pre-Morbid Rankin Score: No symptoms Modified Rankin: Moderately severe disability     Balance Overall balance assessment: Mild deficits observed, not formally tested                                           Pertinent Vitals/Pain Pain Assessment: No/denies pain    Home Living Family/patient expects to be discharged to:: Private residence Living Arrangements: Other relatives(sister and granddaughter) Available Help at Discharge: Family;Available PRN/intermittently Type of Home: House Home Access: Stairs to enter Entrance Stairs-Rails: None Entrance Stairs-Number of Steps: 1 Home Layout: Two level;Able to live on main level with bedroom/bathroom Home Equipment: Walker - 4 wheels;Cane - single point;Shower seat - built in;Grab bars - tub/shower;Bedside commode      Prior Function Level of Independence: Independent with assistive device(s)         Comments: rollator - drives     Hand Dominance        Extremity/Trunk Assessment   Upper Extremity Assessment Upper Extremity Assessment: Defer to OT evaluation    Lower Extremity Assessment Lower Extremity Assessment: Generalized weakness;RLE deficits/detail;LLE deficits/detail RLE Deficits / Details: full ROM against gravity RLE Sensation: WNL LLE Deficits / Details: full ROM against gravity - able to resist min-mod pressure LLE Sensation: decreased proprioception(reports N&T,  but can feel all aspects with eyes closed)    Cervical / Trunk Assessment Cervical / Trunk Assessment: Normal  Communication   Communication: No difficulties  Cognition Arousal/Alertness: Awake/alert Behavior During Therapy: WFL for tasks assessed/performed Overall Cognitive Status: Within Functional  Limits for tasks assessed                                        General Comments General comments (skin integrity, edema, etc.): brother present and supportive    Exercises     Assessment/Plan    PT Assessment Patient needs continued PT services  PT Problem List Decreased strength;Decreased activity tolerance;Decreased balance;Decreased mobility;Decreased knowledge of use of DME;Decreased safety awareness       PT Treatment Interventions DME instruction;Gait training;Stair training;Functional mobility training;Therapeutic activities;Therapeutic exercise;Balance training;Neuromuscular re-education;Patient/family education    PT Goals (Current goals can be found in the Care Plan section)  Acute Rehab PT Goals Patient Stated Goal: return home PT Goal Formulation: With patient Time For Goal Achievement: 10/28/18 Potential to Achieve Goals: Good    Frequency Min 4X/week   Barriers to discharge        Co-evaluation               AM-PAC PT "6 Clicks" Daily Activity  Outcome Measure Difficulty turning over in bed (including adjusting bedclothes, sheets and blankets)?: A Little Difficulty moving from lying on back to sitting on the side of the bed? : A Little Difficulty sitting down on and standing up from a chair with arms (e.g., wheelchair, bedside commode, etc,.)?: Unable Help needed moving to and from a bed to chair (including a wheelchair)?: A Little Help needed walking in hospital room?: A Little Help needed climbing 3-5 steps with a railing? : A Lot 6 Click Score: 15    End of Session Equipment Utilized During Treatment: Gait belt Activity Tolerance: Patient tolerated treatment well Patient left: in chair;with call bell/phone within reach;with family/visitor present Nurse Communication: Mobility status PT Visit Diagnosis: Unsteadiness on feet (R26.81);Other abnormalities of gait and mobility (R26.89);Muscle weakness (generalized) (M62.81)     Time: 7711-6579 PT Time Calculation (min) (ACUTE ONLY): 26 min   Charges:   PT Evaluation $PT Eval Moderate Complexity: 1 Mod PT Treatments $Gait Training: 8-22 mins        Lanney Gins, PT, DPT Supplemental Physical Therapist 10/14/18 1:27 PM Pager: [phone] Office: [phone]

## 2018-10-14 NOTE — Evaluation (Signed)
Speech Language Pathology Evaluation Patient Details Name: [name] MRN: [id number] DOB: [birth year] Today's Date: 10/14/2018 Time: 1287-8676 SLP Time Calculation (min) (ACUTE ONLY): 25 min  Problem List:  Patient Active Problem List   Diagnosis Date Noted  . Stroke (cerebrum) ([place]) 10/13/2018  . Folliculitis of left axilla 04/17/2018  . Frequent PVCs   . Controlled type 1 diabetes mellitus with hyperglycemia, with long-term current use of insulin ([place])   . BPPV (benign paroxysmal positional vertigo), right 06/13/2017  . Premature atrial beats 06/13/2017  . Snoring 07/31/2016  . Pre-ulcerative corn or callous 12/15/2015  . History of CHF (congestive heart failure) 10/29/2015  . Essential hypertension 10/29/2015  . Cerebrovascular accident (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 ([place]) 10/29/2015  . Toe injury 09/24/2015  . Thyroid activity decreased   . Intracranial vascular stenosis   . HLD (hyperlipidemia)   . Cerebral infarction due to thrombosis of basilar artery ([place])   . CVA (cerebral vascular accident) ([place]) 08/29/2015  . Special screening for malignant neoplasms, colon 09/10/2014  . Great toe pain 07/09/2014  . Posterior tibial tendinitis of right leg 04/26/2014  . Complicated UTI (urinary tract infection) 12/24/2013  . Morbid obesity ([place]) 05/29/2013  . Back pain without radiation 04/28/2013  . Nail dystrophy 01/10/2013  . Diabetes mellitus type I ([place]) 01/25/2012  . Hyperlipidemia 01/25/2012  . Hypothyroidism 01/25/2012  . Hypertension 01/25/2012  . GERD (gastroesophageal reflux disease) 01/25/2012   Past Medical History:  Past Medical History:  Diagnosis Date  . Anemia   . Arthritis   . CHF (congestive heart failure) ([place])   . Depression   . GERD (gastroesophageal reflux disease)   . Hemorrhoids   . Hyperlipidemia   . Hypertension   . Hypothyroidism   . Seasonal allergies   . Stroke ([place])   . Thyroid cancer ([place])    per pt S/p Total  Thyroidectomy with Radioactive Iodine Therapy  . Type 1 diabetes mellitus ([place])    Past Surgical History:  Past Surgical History:  Procedure Laterality Date  . ABDOMINAL ANGIOGRAM N/A 05/14/2012   Procedure: ABDOMINAL ANGIOGRAM;  Surgeon: Laverda Page, MD;  Location: [facility] CATH LAB;  Service: Cardiovascular;  Laterality: N/A;  . ABDOMINAL HYSTERECTOMY  1995   partial  . ANKLE FRACTURE SURGERY Right   . CARDIAC CATHETERIZATION     with coronary angiogram  . COLONOSCOPY N/A 09/09/2014   Procedure: COLONOSCOPY;  Surgeon: Gatha Mayer, MD;  Location: [facility];  Service: Endoscopy;  Laterality: N/A;  . LEFT AND RIGHT HEART CATHETERIZATION WITH CORONARY ANGIOGRAM N/A 05/14/2012   Procedure: LEFT AND RIGHT HEART CATHETERIZATION WITH CORONARY ANGIOGRAM;  Surgeon: Laverda Page, MD;  Location[facility] CATH LAB;  Service: Cardiovascular;  Laterality: N/A;  . MINI METER   09/08/2014   ELECTRONIC INSULIN PUMP  . THYROIDECTOMY  2006   HPI:  [name] [name] is a 67 y.o. female presenting with new stroke. PMH is significant for previous stroke, T1DM, HLD, Hypothyroidism, HTN, GERD. Admitted with left sided numbness. CT of the head negative, MRI pending.    Assessment / Plan / Recommendation Clinical Impression  Patient presents with mild deficits in the areas of short term memory and complex reasoning, both of which are baseline from previous CVA per patient and brother. Functionally, patient independent with use of compensatory strategies to maximize independence. No f/u SLP needs indicated.     SLP Assessment  SLP Recommendation/Assessment: Patient does not need any further Speech Lanaguage  Pathology Services SLP Visit Diagnosis: Cognitive communication deficit (R41.841)    Follow Up Recommendations  None          SLP Evaluation Cognition  Overall Cognitive Status: History of cognitive impairments - at baseline Arousal/Alertness: Awake/alert Orientation Level: Oriented  X4 Memory: Impaired Memory Impairment: Retrieval deficit;Decreased short term memory Decreased Short Term Memory: Verbal complex;Functional complex Awareness: Appears intact Problem Solving: Appears intact Executive Function: Reasoning Reasoning: Impaired Reasoning Impairment: Functional complex       Comprehension  Auditory Comprehension Overall Auditory Comprehension: Appears within functional limits for tasks assessed Visual Recognition/Discrimination Discrimination: Within Function Limits Reading Comprehension Reading Status: Within funtional limits    Expression Expression Primary Mode of Expression: Verbal Verbal Expression Overall Verbal Expression: Appears within functional limits for tasks assessed   Oral / Motor  Oral Motor/Sensory Function Overall Oral Motor/Sensory Function: Within functional limits Motor Speech Overall Motor Speech: Appears within functional limits for tasks assessed   GO                   Gabriel Rainwater MA, CCC-SLP   [name] 10/14/2018, 10:25 AM

## 2018-10-14 NOTE — Progress Notes (Signed)
Pt back to unit from procedure. Delia Heady RN    10/14/18 0933  Vitals  Temp 98.5 F (36.9 C)  Temp Source Oral  BP (!) 160/91  MAP (mmHg) 103  BP Location Right Arm  BP Method Automatic  Patient Position (if appropriate) Lying  Pulse Rate 67  Pulse Rate Source Dinamap  Resp 18  Oxygen Therapy  SpO2 100 %  O2 Device Room Air

## 2018-10-14 NOTE — Consult Note (Signed)
Neurology Consultation Reason for Consult: Left-sided weakness Referring Physician: Tyrone Nine, D  CC: Left-sided weakness  History is obtained from: Patient  HPI: [name] is a 67 y.o. female with a history of hypertension, hyperlipidemia, CHF, stroke who presents with left-sided weakness and numbness that started on awakening from a nap.  She laid down around 8 PM, noticed around 8:30 PM.  A code stroke was activated, but she had significant improvement en route to the hospital.  At the time of her arrival, her NIH stroke scale was 2 and with mild symptoms I opted not to pursue IV TPA.   LKW: 8 PM tpa given?: no, mild symptoms Premorbid modified rankin scale: 0    ROS: A 14 point ROS was performed and is negative except as noted in the HPI.  Past Medical History:  Diagnosis Date  . Anemia   . Arthritis   . CHF (congestive heart failure) ([place])   . Depression   . GERD (gastroesophageal reflux disease)   . Hemorrhoids   . Hyperlipidemia   . Hypertension   . Hypothyroidism   . Seasonal allergies   . Stroke ([place])   . Thyroid cancer ([place])    per pt S/p Total Thyroidectomy with Radioactive Iodine Therapy  . Type 1 diabetes mellitus ([facility])      Family History  Problem Relation Age of Onset  . Heart disease Mother        MI  . Hyperlipidemia Mother   . Hypertension Mother   . Renal Disease Mother        insufficiency  . Early death Father        Head Injury at Work  . Hyperlipidemia Father   . Hypertension Father   . Stroke Son   . Diabetes Maternal Aunt   . Prostate cancer Paternal Uncle   . Heart disease Maternal Aunt   . Breast cancer Neg Hx      Social History:  reports that she has never smoked. She has never used smokeless tobacco. She reports that she does not drink alcohol or use drugs.   Exam: Current vital signs: BP (!) 156/63 (BP Location: Right Arm)   Pulse (!) 59   Temp 97.9 F (36.6 C) (Oral)   Resp 20   Ht 5\' 7"  (1.702 m)   Wt  125.8 kg   SpO2 100%   BMI 43.44 kg/m  Vital signs in last 24 hours: Temp:  [97.9 F (36.6 C)-98 F (36.7 C)] 97.9 F (36.6 C) (10/20 2326) Pulse Rate:  [59-76] 59 (10/20 2326) Resp:  [15-23] 20 (10/20 2326) BP: (116-187)/(55-73) 156/63 (10/20 2326) SpO2:  [100 %] 100 % (10/20 2326) Weight:  [125.8 kg-127.2 kg] 125.8 kg (10/20 2326)   Physical Exam  Constitutional: Appears well-developed and well-nourished.  Psych: Affect appropriate to situation Eyes: No scleral injection HENT: No OP obstrucion Head: Normocephalic.  Cardiovascular: Normal rate and regular rhythm.  Respiratory: Effort normal, non-labored breathing GI: Soft.  No distension. There is no tenderness.  Skin: WDI  Neuro: Mental Status: Patient is awake, alert, oriented to person, place, month, year, and situation. Patient is able to give a clear and coherent history. No signs of aphasia or neglect Cranial Nerves: II: Visual Fields are full. Pupils are equal, round, and reactive to light.   III,IV, VI: EOMI without ptosis or diploplia.  V: Facial sensation is symmetric to temperature VII: Facial movement is symmetric.  VIII: hearing is intact to voice X: Uvula elevates symmetrically  XI: Shoulder shrug is symmetric. XII: tongue is midline without atrophy or fasciculations.  Motor: Tone is normal. Bulk is normal. 5/5 strength was present in bilateral arm and the right leg, she has 4+/5 strength in the elft leg.  Sensory: Sensation is diminished in the elft amr and leg.  Cerebellar: FNF intact bilaterally  I have reviewed labs in epic and the results pertinent to this consultation are: CMP-elevated glucose  I have reviewed the images obtained: CT head-unremarkable  Impression: 67 year old female with TIA.  She is already on Plavix, I would favor dual antiplatelet therapy for 3 weeks followed by monotherapy again.  Recommendations: - HgbA1c, fasting lipid panel - MRI, MRA  of the brain without  contrast - Frequent neuro checks - Echocardiogram - Carotid dopplers - Prophylactic therapy-Antiplatelet med: Aspirin+ Plavix for 3 weeks followed by monotherapy - Risk factor modification - Telemetry monitoring - PT consult, OT consult, Speech consult - Stroke team to follow   Roland Rack, MD [facility] [phone]  If 7pm- 7am, please page neurology on call as listed in [place].

## 2018-10-14 NOTE — Progress Notes (Signed)
OT Cancellation    10/14/18 0700  OT Visit Information  Last OT Received On 10/14/18  Reason Eval/Treat Not Completed Patient at procedure or test/ unavailable. MRI. Will return as schedule allows. Thank you.    [place], OTR/L Acute Rehab Pager: [phone] Office: [phone]

## 2018-10-14 NOTE — Progress Notes (Signed)
During change of shift; pt noted to be off unit to MRI. Will assess when pt return back to the unit. Delia Heady RN

## 2018-10-14 NOTE — Progress Notes (Signed)
Pt transported off unit to MRI. P. Amo Antonie Borjon RN 

## 2018-10-14 NOTE — Progress Notes (Signed)
OT Cancellation Note  Patient Details Name: [name] MRN: [id number] DOB: October 02, 1951   Cancelled Treatment:    Reason Eval/Treat Not Completed: Patient at procedure or test/ unavailable(ECHO. Second attempt. Will return as schedule allows. Thank you.)  [place], OTR/L Acute Rehab Pager: [phone] Office: [phone] 10/14/2018, 1:09 PM

## 2018-10-14 NOTE — Progress Notes (Signed)
Inpatient Diabetes Program Recommendations  AACE/ADA: New Consensus Statement on Inpatient Glycemic Control (2015)  Target Ranges:  Prepandial:   less than 140 mg/dL      Peak postprandial:   less than 180 mg/dL (1-2 hours)      Critically ill patients:  140 - 180 mg/dL    Review of Glycemic Control  Diabetes history: DM 1, Sees [name], Next appointment 10/2018 Outpatient Diabetes medications: Medtronic 630 insulin pump Current orders for Inpatient glycemic control: Insulin pump order set  Inpatient Diabetes Program Recommendations:    TIA versus stroke workup  Patient states that she was diagnosed with diabetes around the age of 54.  Patient has insulin pump in the bed connected. Patient repots she does have supplies with her.   Current insulin pump settings are as follows:  Basal insulin  12A-4A  0.550 units/hour   4A-9A  0.800 units/hour  9A-12P 1.25 units/hour 12P-1:30P 0.95 units/hour 1:30P-3P 1.1 units/hour 3P-6P  1.0 units/hour 6P-7:30P 1.1 units/hour 7:30P-12A 0.95 units/hour  Total daily basal insulin: 21.95 units/24 hours  Carb Coverage 1:6 1 unit for every 6 grams of carbohydrates  Insulin Sensitivity 12A-8A  1:60 1 unit drops blood glucose 60 mg/dl 8A-8P  1:50 8P-12A 1:60  Target Glucose Goals  140 mg/dl  In talking with the patient she states that her last A1c had gone down and [name] was happy with it. She also mentioned she has a high glucose and does not like her glucose to drop below 100 because of the quick nature she could have a hypoglycemia event.  Discussed A1c and glucose averages with patient. Based on pump settings and target value in her pump, would expect to see higher trends with patient.  Patient will follow up with [name] next month to discuss pump- rate changes.  Thanks,  Tama Headings RN, MSN, BC-ADM Inpatient Diabetes Coordinator Team Pager [phone] (8a-5p)

## 2018-10-15 ENCOUNTER — Encounter: Payer: Self-pay | Admitting: Family Medicine

## 2018-10-15 ENCOUNTER — Other Ambulatory Visit: Payer: Self-pay | Admitting: Medical

## 2018-10-15 ENCOUNTER — Other Ambulatory Visit: Payer: Self-pay | Admitting: Family Medicine

## 2018-10-15 ENCOUNTER — Other Ambulatory Visit: Payer: Self-pay

## 2018-10-15 DIAGNOSIS — I471 Supraventricular tachycardia: Secondary | ICD-10-CM

## 2018-10-15 DIAGNOSIS — E1065 Type 1 diabetes mellitus with hyperglycemia: Secondary | ICD-10-CM

## 2018-10-15 DIAGNOSIS — I491 Atrial premature depolarization: Secondary | ICD-10-CM

## 2018-10-15 DIAGNOSIS — R931 Abnormal findings on diagnostic imaging of heart and coronary circulation: Secondary | ICD-10-CM

## 2018-10-15 DIAGNOSIS — I639 Cerebral infarction, unspecified: Secondary | ICD-10-CM

## 2018-10-15 DIAGNOSIS — I498 Other specified cardiac arrhythmias: Secondary | ICD-10-CM

## 2018-10-15 DIAGNOSIS — I10 Essential (primary) hypertension: Secondary | ICD-10-CM

## 2018-10-15 LAB — CBC WITH DIFFERENTIAL/PLATELET
Abs Immature Granulocytes: 0.01 10*3/uL (ref 0.00–0.07)
BASOS PCT: 1 %
Basophils Absolute: 0 10*3/uL (ref 0.0–0.1)
EOS ABS: 0.1 10*3/uL (ref 0.0–0.5)
EOS PCT: 2 %
HCT: 34.4 % — ABNORMAL LOW (ref 36.0–46.0)
HEMOGLOBIN: 10.6 g/dL — AB (ref 12.0–15.0)
Immature Granulocytes: 0 %
LYMPHS PCT: 37 %
Lymphs Abs: 1.6 10*3/uL (ref 0.7–4.0)
MCH: 28 pg (ref 26.0–34.0)
MCHC: 30.8 g/dL (ref 30.0–36.0)
MCV: 90.8 fL (ref 80.0–100.0)
MONO ABS: 0.6 10*3/uL (ref 0.1–1.0)
Monocytes Relative: 14 %
Neutro Abs: 2 10*3/uL (ref 1.7–7.7)
Neutrophils Relative %: 46 %
Platelets: 236 10*3/uL (ref 150–400)
RBC: 3.79 MIL/uL — AB (ref 3.87–5.11)
RDW: 12.3 % (ref 11.5–15.5)
WBC: 4.4 10*3/uL (ref 4.0–10.5)
nRBC: 0 % (ref 0.0–0.2)

## 2018-10-15 LAB — BASIC METABOLIC PANEL
Anion gap: 9 (ref 5–15)
BUN: 14 mg/dL (ref 8–23)
CO2: 24 mmol/L (ref 22–32)
CREATININE: 0.87 mg/dL (ref 0.44–1.00)
Calcium: 9 mg/dL (ref 8.9–10.3)
Chloride: 106 mmol/L (ref 98–111)
GFR calc Af Amer: >60 mL/min (ref >60)
Glucose, Bld: 207 mg/dL — ABNORMAL HIGH (ref 70–99)
Potassium: 3.6 mmol/L (ref 3.5–5.1)
SODIUM: 139 mmol/L (ref 135–145)

## 2018-10-15 LAB — GLUCOSE, CAPILLARY
GLUCOSE-CAPILLARY: 151 mg/dL — AB (ref 70–99)
GLUCOSE-CAPILLARY: 168 mg/dL — AB (ref 70–99)
GLUCOSE-CAPILLARY: 185 mg/dL — AB (ref 70–99)
GLUCOSE-CAPILLARY: 198 mg/dL — AB (ref 70–99)
Glucose-Capillary: 182 mg/dL — ABNORMAL HIGH (ref 70–99)
Glucose-Capillary: 225 mg/dL — ABNORMAL HIGH (ref 70–99)

## 2018-10-15 LAB — HEMOGLOBIN A1C
Hgb A1c MFr Bld: 9.8 % — ABNORMAL HIGH (ref 4.8–5.6)
MEAN PLASMA GLUCOSE: 235 mg/dL

## 2018-10-15 MED ORDER — LOSARTAN POTASSIUM 25 MG PO TABS
25.0000 mg | ORAL_TABLET | Freq: Every day | ORAL | Status: DC
  Administered 2018-10-15: 25 mg via ORAL
  Filled 2018-10-15: qty 1

## 2018-10-15 NOTE — Progress Notes (Addendum)
STROKE TEAM PROGRESS NOTE   INTERVAL HISTORY Pt up in chair. During vision, RN entered. Pt having runs of AF. Cardiology consulted this am but has not yet seen. Current stroke does not appear embolic, however, with AF confirmed, DOAC recommended for secondary stroke prevention.  Vitals:   10/14/18 1650 10/14/18 2004 10/15/18 0004 10/15/18 0350  BP: (!) 128/59 (!) 135/58 (!) 143/62 130/61  Pulse: 64 75 66 60  Resp: 17 18 18 20   Temp: 98.2 F (36.8 C) (!) 97.5 F (36.4 C) 98.2 F (36.8 C) 98.3 F (36.8 C)  TempSrc:  Oral Oral Oral  SpO2: 100% 97% 98% 100%  Weight:      Height:        CBC:  Recent Labs  Lab 10/13/18 2131  10/14/18 0439 10/15/18 0425  WBC 5.3  --  4.7 4.4  NEUTROABS 2.7  --   --  2.0  HGB 11.1*   < > 10.3* 10.6*  HCT 36.6   < > 33.3* 34.4*  MCV 92.4  --  89.8 90.8  PLT 231  --  228 236   < > = values in this interval not displayed.    Basic Metabolic Panel:  Recent Labs  Lab 10/14/18 0439 10/15/18 0425  NA 141 139  K 3.7 3.6  CL 109 106  CO2 25 24  GLUCOSE 257* 207*  BUN 11 14  CREATININE 0.85 0.87  CALCIUM 8.9 9.0   Lipid Panel:     Component Value Date/Time   CHOL 107 10/14/2018 0439   TRIG 53 10/14/2018 0439   HDL 48 10/14/2018 0439   CHOLHDL 2.2 10/14/2018 0439   VLDL 11 10/14/2018 0439   LDLCALC 48 10/14/2018 0439   HgbA1c:  Lab Results  Component Value Date   HGBA1C 9.8 (H) 10/14/2018   Urine Drug Screen:     Component Value Date/Time   LABOPIA NONE DETECTED 10/14/2018 0642   COCAINSCRNUR NONE DETECTED 10/14/2018 0642   LABBENZ NONE DETECTED 10/14/2018 0642   AMPHETMU NONE DETECTED 10/14/2018 0642   THCU NONE DETECTED 10/14/2018 0642   LABBARB NONE DETECTED 10/14/2018 0642    Alcohol Level No results found for: ETH  IMAGING Ct Angio Head W Or Wo Contrast  Result Date: 10/14/2018 CLINICAL DATA:  67 year old female code stroke presentation yesterday with left side deficits. EXAM: CT ANGIOGRAPHY HEAD AND NECK TECHNIQUE:  Multidetector CT imaging of the head and neck was performed using the standard protocol during bolus administration of intravenous contrast. Multiplanar CT image reconstructions and MIPs were obtained to evaluate the vascular anatomy. Carotid stenosis measurements (when applicable) are obtained utilizing NASCET criteria, using the distal internal carotid diameter as the denominator. CONTRAST:  1mL ISOVUE-370 IOPAMIDOL (ISOVUE-370) INJECTION 76% COMPARISON:  Head CT without contrast 10/13/2018. CTA head and neck 08/29/2015 and 08/30/2015. FINDINGS: CTA NECK Skeleton: No acute osseous abnormality identified. Visualized paranasal sinuses and mastoids are stable and well pneumatized. Largely absent dentition. Upper chest: Negative upper lungs. No superior mediastinal lymphadenopathy. Other neck: Surgically absent thyroid as before. Otherwise negative. Aortic arch: Mild Calcified aortic atherosclerosis. Bovine type arch configuration. Stable great vessel origins since 2016 with no origin stenosis. Right carotid system: Stable mild tortuosity of the proximal right CCA. Minimal right CCA and right carotid bifurcation plaque with no right carotid stenosis in the neck. Mildly tortuous cervical right ICA. Left carotid system: Mild left CCA tortuosity. Chronic left CCA mostly calcified plaque just above the level of the thyroid with no stenosis, stable. Mild  mostly calcified plaque at the left carotid bifurcation is stable with no stenosis. Vertebral arteries: No proximal right subclavian artery stenosis. Calcified plaque near the right vertebral artery origin, but no origin stenosis. Mildly tortuous right vertebral artery is patent to the skull base without stenosis. No proximal left subclavian artery stenosis despite soft and calcified plaque. Similar mild soft and calcified plaque near the left vertebral artery origin but no origin stenosis. Patent and occasionally tortuous left vertebral artery to the skull base without  stenosis. CTA HEAD Posterior circulation: Moderate to severe bilateral V4 segment calcified plaque is re- demonstrated with associated moderate to severe bilateral V4 segment stenosis distal to both PICA origins which remain patent (series 7, image 137 on the left and image 132 on the right). The distal V4 segments then remain normal. Patent vertebrobasilar junction and basilar artery without stenosis. SCA and PCA origins are patent and within normal limits. Small left posterior communicating artery is present. The right is diminutive or absent. Bilateral PCA branches are within normal limits. Anterior circulation: Moderate to severe bilateral ICA siphon calcified atherosclerosis redemonstrated. Both ophthalmic artery origins remain patent. Hemodynamically significant stenosis on the right suspected at the junction of the vertical petrous and cavernous segments, with moderate right supraclinoid the stenosis. These segments appears stable. On the left side hemodynamically significant siphon stenosis is suspected in the proximal and distal cavernous segment. The segments appears stable. Carotid termini remain patent. MCA and ACA origins are normal. Anterior communicating artery and bilateral ACA branches are stable and within normal limits. Left MCA M1 segment, bifurcation, and left MCA branches are stable and within normal limits. Right MCA M1 segment, bifurcation, and right MCA branches are stable and within normal limits. Venous sinuses: Patent. Anatomic variants: Bovine type aortic arch configuration. Delayed phase: Gray-white matter differentiation in the right hemisphere peers stable and within normal limits. No acute or evolving infarct is identified. No intracranial mass effect, ventriculomegaly, or hemorrhage identified. No abnormal enhancement identified. Review of the MIP images confirms the above findings IMPRESSION: 1. CTA of the head and neck is stable since 2016 and remarkable for High-grade stenosis of  both ICA siphons and both distal vertebral arteries (V4) due to chronic bulky calcified plaque. There is comparatively little extracranial atherosclerosis, and no plaque or stenosis identified in the circle of Willis branches. 2. Stable CT appearance of the brain since yesterday. No acute or evolving infarct identified. Electronically Signed   By: Genevie Ann M.D.   On: 10/14/2018 09:27   Dg Chest 2 View  Result Date: 10/14/2018 CLINICAL DATA:  Stroke x last night around 830pm, left sided numbness from neck down to her feet, HTN, diabetic, nonsmoker EXAM: CHEST - 2 VIEW COMPARISON:  04/03/2016 FINDINGS: Central pulmonary vascular congestion. No confluent airspace disease. Moderate cardiomegaly stable.  Aortic Atherosclerosis (ICD10-170.0). No effusion. Osteopenia with stable mild midthoracic compression deformity. IMPRESSION: Stable cardiomegaly and central pulmonary vascular Electronically Signed   By: Lucrezia Europe M.D.   On: 10/14/2018 08:45   Ct Angio Neck W Or Wo Contrast  Result Date: 10/14/2018 CLINICAL DATA:  66 year old female code stroke presentation yesterday with left side deficits. EXAM: CT ANGIOGRAPHY HEAD AND NECK TECHNIQUE: Multidetector CT imaging of the head and neck was performed using the standard protocol during bolus administration of intravenous contrast. Multiplanar CT image reconstructions and MIPs were obtained to evaluate the vascular anatomy. Carotid stenosis measurements (when applicable) are obtained utilizing NASCET criteria, using the distal internal carotid diameter as the denominator. CONTRAST:  37mL ISOVUE-370 IOPAMIDOL (ISOVUE-370) INJECTION 76% COMPARISON:  Head CT without contrast 10/13/2018. CTA head and neck 08/29/2015 and 08/30/2015. FINDINGS: CTA NECK Skeleton: No acute osseous abnormality identified. Visualized paranasal sinuses and mastoids are stable and well pneumatized. Largely absent dentition. Upper chest: Negative upper lungs. No superior mediastinal  lymphadenopathy. Other neck: Surgically absent thyroid as before. Otherwise negative. Aortic arch: Mild Calcified aortic atherosclerosis. Bovine type arch configuration. Stable great vessel origins since 2016 with no origin stenosis. Right carotid system: Stable mild tortuosity of the proximal right CCA. Minimal right CCA and right carotid bifurcation plaque with no right carotid stenosis in the neck. Mildly tortuous cervical right ICA. Left carotid system: Mild left CCA tortuosity. Chronic left CCA mostly calcified plaque just above the level of the thyroid with no stenosis, stable. Mild mostly calcified plaque at the left carotid bifurcation is stable with no stenosis. Vertebral arteries: No proximal right subclavian artery stenosis. Calcified plaque near the right vertebral artery origin, but no origin stenosis. Mildly tortuous right vertebral artery is patent to the skull base without stenosis. No proximal left subclavian artery stenosis despite soft and calcified plaque. Similar mild soft and calcified plaque near the left vertebral artery origin but no origin stenosis. Patent and occasionally tortuous left vertebral artery to the skull base without stenosis. CTA HEAD Posterior circulation: Moderate to severe bilateral V4 segment calcified plaque is re- demonstrated with associated moderate to severe bilateral V4 segment stenosis distal to both PICA origins which remain patent (series 7, image 137 on the left and image 132 on the right). The distal V4 segments then remain normal. Patent vertebrobasilar junction and basilar artery without stenosis. SCA and PCA origins are patent and within normal limits. Small left posterior communicating artery is present. The right is diminutive or absent. Bilateral PCA branches are within normal limits. Anterior circulation: Moderate to severe bilateral ICA siphon calcified atherosclerosis redemonstrated. Both ophthalmic artery origins remain patent. Hemodynamically  significant stenosis on the right suspected at the junction of the vertical petrous and cavernous segments, with moderate right supraclinoid the stenosis. These segments appears stable. On the left side hemodynamically significant siphon stenosis is suspected in the proximal and distal cavernous segment. The segments appears stable. Carotid termini remain patent. MCA and ACA origins are normal. Anterior communicating artery and bilateral ACA branches are stable and within normal limits. Left MCA M1 segment, bifurcation, and left MCA branches are stable and within normal limits. Right MCA M1 segment, bifurcation, and right MCA branches are stable and within normal limits. Venous sinuses: Patent. Anatomic variants: Bovine type aortic arch configuration. Delayed phase: Gray-white matter differentiation in the right hemisphere peers stable and within normal limits. No acute or evolving infarct is identified. No intracranial mass effect, ventriculomegaly, or hemorrhage identified. No abnormal enhancement identified. Review of the MIP images confirms the above findings IMPRESSION: 1. 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 There is comparatively little extracranial atherosclerosis, and no plaque or stenosis identified in the circle of Willis branches. 2. Stable CT appearance of the brain since yesterday. No acute or evolving infarct identified. Electronically Signed   By: Genevie Ann M.D.   On: 10/14/2018 09:27   Mr Brain Wo Contrast  Result Date: 10/14/2018 CLINICAL DATA:  Follow up stroke. History of stroke, hyperlipidemia, hypertension, diabetes and thyroid cancer. EXAM: MRI HEAD WITHOUT CONTRAST TECHNIQUE: Multiplanar, multiecho pulse  sequences of the brain and surrounding structures were obtained without intravenous contrast. COMPARISON:  CT HEAD October 14, 2018 and MRI  head August 29, 2015 FINDINGS: Multiple sequences are moderately motion degraded. INTRACRANIAL CONTENTS: 6 mm acute RIGHT pontine infarct at superior cerebellar peduncle with low ADC values. A few scattered chronic microhemorrhages noted. Patchy supratentorial white matter T2 hyperintensities. Old pontine lacunar infarcts. Prominent basal ganglia perivascular spaces associated with chronic small vessel ischemic changes. No parenchymal brain volume loss for age. No midline shift, mass effect or masses. No abnormal extra-axial fluid collections. Basal cisterns are patent. VASCULAR: Normal major intracranial vascular flow voids present at skull base. SKULL AND UPPER CERVICAL SPINE: No abnormal sellar expansion. No suspicious calvarial bone marrow signal. Craniocervical junction maintained. SINUSES/ORBITS: The mastoid air-cells and included paranasal sinuses are well-aerated.The included ocular globes and orbital contents are non-suspicious. Status post bilateral ocular lens implants. OTHER: Patient is edentulous. IMPRESSION: 1. Motion degraded examination. Acute subcentimeter RIGHT pontine infarct. 2. 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 Electronically Signed   By: Elon Alas M.D.   On: 10/14/2018 20:29   Ct Head Code Stroke Wo Contrast  Result Date: 10/13/2018 CLINICAL DATA:  Code stroke.  Left-sided weakness. EXAM: CT HEAD WITHOUT CONTRAST TECHNIQUE: Contiguous axial images were obtained from the base of the skull through the vertex without intravenous contrast. COMPARISON:  04/03/2016 FINDINGS: Brain: No evidence of acute infarction, hemorrhage, hydrocephalus, extra-axial collection or mass lesion/mass effect. 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 Mild, age congruent cerebral volume loss. Vascular: Atherosclerotic calcification.  No hyperdense vessel. Skull: Normal. Negative for fracture or focal lesion. Sinuses/Orbits: No acute finding. Other: These results were  communicated to Dr. Leonel Ramsay at 9:41 pmon 10/20/2019by text page via the [facility] messaging system. ASPECTS [facility] Stroke Program Early CT Score) - Ganglionic level infarction (caudate, lentiform nuclei, internal capsule, insula, M1-M3 cortex): 7 - Supraganglionic infarction (M4-M6 cortex): 3 Total score (0-10 with 10 being normal): 10 IMPRESSION: 1. No acute finding.  ASPECTS is 10. 2. 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 Electronically Signed   By: Monte Fantasia M.D.   On: 10/13/2018 21:43   2D Echocardiogram  - Procedure narrative: Transthoracic echocardiography. Technically difficult study. Intravenous contrast (Definity) was administered. - Left ventricle: The cavity size was normal. There was moderate concentric hypertrophy. Systolic function was normal. The estimated ejection fraction was in the range of 60% to 65%. Wall motion was normal; there were no regional wall motion abnormalities. The study is not technically sufficient to allow evaluation of LV diastolic function. - Left atrium: The atrium was normal in size. - Right atrium: The atrium was normal in size. Impressions:  Technically difficult study. Afib is noted. LVEF 60-65%, moderate LVH, normal wall motion, normal biatrial size.   PHYSICAL EXAM Constitutional: Appears well-developed and well-nourished.  Psych: Affect appropriate to situation Eyes: No scleral injection HENT: No OP obstrucion Head: Normocephalic.  Cardiovascular: Normal rate and regular rhythm.  Respiratory: Effort normal, non-labored breathing Skin: WDI  Neuro: Mental Status: Patient is awake, alert, oriented to person, place, month, year, and situation. Patient is able to give a clear and coherent history. No signs of aphasia or neglect Cranial Nerves: II: Visual Fields are full. Pupils are equal, round, and reactive to light.   III,IV, VI: EOMI without ptosis or diploplia.  V: Facial sensation is symmetric to temperature VII: Facial movement is  symmetric. Decreased sensation just behind L ear extending to upper neck  VIII: hearing  is intact to voice X: Uvula elevates symmetrically XI: Shoulder shrug is symmetric. XII: tongue is midline without atrophy or fasciculations.  Motor: Tone is normal. Bulk is normal. 5/5 strength was present in bilateral arm and the right leg, she has 4+/5 strength in the left leg.  Sensory: Sensation is diminished in the left arm and leg. no extinction/neglect Cerebellar: FNF intact bilaterally  ASSESSMENT/PLAN Ms. [name] Peola Joynt is a 68 y.o. female with history of HTN, HLD, prior stroke, CHF, type I DB, thyroid cancer presenting with L hemiparesis and L sided numbness.   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   Code Stroke CT head No acute stroke. Old L pontine lacune. ASPECTS 10.     CTA head & neck stable since 2016. High grade stenosis B ICA siphons and B distal VA V4.  MRI  R pontine infarct. Small vessel disease. Old pontine lacunar infarcts  2D Echo  EF 60-65%. Pt in AF. No embolus seen  LDL 48  HgbA1c 9.8  HIV neg  Lovenox 40 mg sq daily for VTE prophylaxis  clopidogrel 75 mg daily prior to admission, now on aspirin 325 mg daily and clopidogrel 75 mg daily. Given new onset AF, recommend DOAC for secondary stroke prevention. No indication for additional aspirin from stroke standpoint.   Therapy recommendations:  HH PT, HHOT  Disposition:  Return home  Follow up with neurology stroke clinic in 4 weeks. Order placed. Added to d/c instructions.  Atrial Fibrillation  Seen on 2D  No hx known atrial fibrillation, but was scheduled to have an OP cardiology appt for irregular heart beat  Pt with runs of AF today per RN  Cardiology consulted this am, but have not seen yet  Stroke recommends anticoagulation w/ DOAC for secondary stroke prevention. No indication for additional aspirin from stroke standpoint.    Hypertension  Stable . Permissive  hypertension (OK if < 220/120) but gradually normalize in 5-7 days . Long-term BP goal normotensive  Hyperlipidemia  Home meds:  lipitor 40, resumed in hospital  LDL 48, goal < 70  Continue statin at discharge  Diabetes type I  Glucoses elevated  Has insulin pump  Working hard at home to get under control per pt  HgbA1c 9.8, goal < 7.0  Uncontrolled  Other Stroke Risk Factors  Advanced age  Morbid Obesity, Body mass index is 43.44 kg/m., recommend weight loss, diet and exercise as appropriate   Hx stroke/TIA  08/2015 - pontine infarct, started on DAPT to plavix  Family hx stroke (son)  Hx Congestive heart failure  Other Active Problems  Hx thyroid cancer, s/p OR 2006.   Hypothyroidism On synthroid. TSH normal.  GERD  Uterine fibroids s/p hysterectomy  Hx PACs, referred to cardiology, but has not seen them  Hospital day # [place], MSN, APRN, ANVP-BC, AGPCNP-BC Advanced Practice Stroke Nurse [place] for Schedule & Pager information 10/15/2018 8:23 AM   ATTENDING NOTE: I reviewed above note and agree with the assessment and plan. Pt was seen and examined.   67 year old female with history of hypertension, diabetes on insulin pump, hyperlipidemia, CHF, thyroid cancer status post total thyroidectomy, previous stroke in 2016 admitted for left-sided weakness numbness.  She had a stroke on 08/29/2015, MRI showed small dorsal brainstem infarct.  CTA head and neck showed bilateral siphon stenosis and bilateral VA stenosis.  EF 55 to 60%.  LDL 61 and A1c 9.1.  She was  put on DAPT and statin and later switched to Plavix alone.  Her symptoms on the left side weakness numbness much improved since admission.  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  EF 60 to 65%. LDL 48 and A1c 9.8.  UDS negative.  MRI again showed right pontine infarct.  However, during echocardiogram, patient  was found to be in questionable A. fib.  Also, this morning telemetry monitoring concerning for A. fib too.  Cardiology consulted, considered frequent PACs and supraventricular ectopy and one short run of supraventricular tachycardia possibly atrial fibrillation for no longer than 10 seconds.  Recommended TEE and loop recorder tomorrow.  Patient currently on aspirin Plavix and Lipitor for stroke prevention.  Continue for now.    However, if A. fib found, she need to be on anticoagulation.  If no A. fib found, TEE negative, agree with loop recorder to rule in or rule out A. Fib.  Will follow.  Rosalin Hawking, MD PhD Stroke Neurology 10/15/2018 6:32 PM    To contact Stroke Continuity provider, please refer to [email or web address] After hours, contact General Neurology

## 2018-10-15 NOTE — H&P (View-Only) (Signed)
Cardiology Consultation:   Patient ID: [name]; [id number]; 1951-08-06   Admit date: 10/13/2018 Date of Consult: 10/15/2018  Primary Care Provider: Shirley, [name], DO Primary Cardiologist: Dr. Marlou [name] Primary Electrophysiologist:  None   Patient Profile:   [name] is a 67 y.o. female with a PMH of previous CVA in 2016, HTN, HLD, DM type 1, thyroid cancer s/p thyroidectomy, and GERD who is being seen today for the evaluation of atrial fibrillation at the request of [name].  History of Present Illness:   [name] was in her usual state of health until the evening of 10/13/18 when she experienced sudden onset left sided weakness. She was found to have an acute stroke on MRI. She has a history of previous stroke in 2016 but no evidence of atrial fibrillation at that time.   She was last evaluated by cardiology, Dr. Marlou [name], in 2017 who presented for evaluation of CHF. Per note, no evidence of CHF but was felt to have secondary pulmonary hypertension due to her morbid obesity. She had a R/LHC in 2013 which confirmed elevated pulmonary pressures and normal coronaries. She was recommended to undergo healthy lifestyle and dietary modifications and follow-up as needed.   She denies palpitations, chest pain, SOB, orthopnea, PND, or LE edema. She has never been evaluated for sleep apnea in the past. She states she has been told she has an abnormal heart beat but never been diagnosed with atrial fibrillation/flutter.     Past Medical History:  Diagnosis Date  . Anemia   . Arthritis   . CHF (congestive heart failure) ([place])   . Depression   . GERD (gastroesophageal reflux disease)   . Hemorrhoids   . Hyperlipidemia   . Hypertension   . Hypothyroidism   . Seasonal allergies   . Stroke ([place])   . Thyroid cancer ([place])    per pt S/p Total Thyroidectomy with Radioactive Iodine Therapy  . Type 1 diabetes mellitus ([place])     Past Surgical History:    Procedure Laterality Date  . ABDOMINAL ANGIOGRAM N/A 05/14/2012   Procedure: ABDOMINAL ANGIOGRAM;  Surgeon: Laverda Page, MD;  Location: [facility] CATH LAB;  Service: Cardiovascular;  Laterality: N/A;  . ABDOMINAL HYSTERECTOMY  1995   partial  . ANKLE FRACTURE SURGERY Right   . CARDIAC CATHETERIZATION     with coronary angiogram  . COLONOSCOPY N/A 09/09/2014   Procedure: COLONOSCOPY;  Surgeon: Gatha Mayer, MD;  Location: [facility];  Service: Endoscopy;  Laterality: N/A;  . LEFT AND RIGHT HEART CATHETERIZATION WITH CORONARY ANGIOGRAM N/A 05/14/2012   Procedure: LEFT AND RIGHT HEART CATHETERIZATION WITH CORONARY ANGIOGRAM;  Surgeon: Laverda Page, MD;  Location: [facility] CATH LAB;  Service: Cardiovascular;  Laterality: N/A;  . MINI METER   09/08/2014   ELECTRONIC INSULIN PUMP  . THYROIDECTOMY  2006     Home Medications:  Prior to Admission medications   Medication Sig Start Date End Date Taking? Authorizing Provider  atorvastatin (LIPITOR) 40 MG tablet TAKE 1 TABLET(40 MG) BY MOUTH DAILY Patient taking differently: Take 40 mg by mouth daily at 6 PM.  09/30/18  Yes Enid Derry, [name], DO  Besifloxacin HCl (BESIVANCE) 0.6 % SUSP Place 1 drop into both eyes See admin instructions. 4 times daily for 2 days after eye injection   Yes [provider]  clopidogrel (PLAVIX) 75 MG tablet TAKE 1 TABLET BY MOUTH EVERY DAY Patient taking differently: Take 75 mg by mouth daily.  07/15/18  Yes Enid Derry, [name], DO  CRANBERRY PO Take 1 tablet by mouth daily.   Yes [provider]  fexofenadine (ALLEGRA) 30 MG tablet Take 30 mg by mouth daily as needed (allergies).    Yes [provider]  Insulin Human (INSULIN PUMP) SOLN Inject into the skin continuous. humalog insulin   Yes [provider]  levothyroxine (SYNTHROID, LEVOTHROID) 175 MCG tablet Take 175 mcg by mouth daily. 06/06/17  Yes [provider]  losartan-hydrochlorothiazide (HYZAAR) 100-12.5 MG tablet Take 1  tablet by mouth daily. 01/30/18  Yes Guadalupe Dawn, MD  multivitamin-lutein [facility]) CAPS capsule Take 1 capsule by mouth daily.   Yes [provider]  Potassium 99 MG TABS Take 99 mg by mouth daily.   Yes [provider]  ranitidine (ZANTAC) 150 MG tablet TAKE 1 TABLET BY MOUTH TWICE DAILY Patient taking differently: Take 150 mg by mouth 2 (two) times daily.  09/23/18  Yes [name], [name], DO    Inpatient Medications: Scheduled Meds: .  stroke: mapping our early stages of recovery book   Does not apply Once  . aspirin  300 mg Rectal Daily   Or  . aspirin  325 mg Oral Daily  . atorvastatin  80 mg Oral Daily  . clopidogrel  75 mg Oral Daily  . enoxaparin (LOVENOX) injection  40 mg Subcutaneous Q24H  . famotidine  10 mg Oral Daily  . insulin pump   Subcutaneous TID AC, HS, 0200  . levothyroxine  175 mcg Oral QAC breakfast  . losartan  25 mg Oral Daily   Continuous Infusions: . sodium chloride Stopped (10/14/18 0645)   PRN Meds: acetaminophen **OR** acetaminophen (TYLENOL) oral liquid 160 mg/5 mL **OR** acetaminophen, senna-docusate  Allergies:   No Known Allergies  Social History:   Social History   Socioeconomic History  . Marital status: Divorced    Spouse name: Not on file  . Number of children: 1  . Years of education: Not on file  . Highest education level: Not on file  Occupational History  . Occupation: retired Tour manager  . Financial resource strain: Not on file  . Food insecurity:    Worry: Not on file    Inability: Not on file  . Transportation needs:    Medical: Not on file    Non-medical: Not on file  Tobacco Use  . Smoking status: Never Smoker  . Smokeless tobacco: Never Used  Substance and Sexual Activity  . Alcohol use: No  . Drug use: No  . Sexual activity: Not on file  Lifestyle  . Physical activity:    Days per week: Not on file    Minutes per session: Not on file  . Stress: Not on file  Relationships  .  Social connections:    Talks on phone: Not on file    Gets together: Not on file    Attends religious service: Not on file    Active member of club or organization: Not on file    Attends meetings of clubs or organizations: Not on file    Relationship status: Not on file  . Intimate partner violence:    Fear of current or ex partner: Not on file    Emotionally abused: Not on file    Physically abused: Not on file    Forced sexual activity: Not on file  Other Topics Concern  . Not on file  Social History Narrative   Retired Pharmacist, hospital, high school teacher taught mass. One son,  helping to care for her granddaughter. No caffeine. Updated 07/20/2014.    Family History:    Family History  Problem Relation Age of Onset  . Heart disease Mother        MI  . Hyperlipidemia Mother   . Hypertension Mother   . Renal Disease Mother        insufficiency  . Early death Father        Head Injury at Work  . Hyperlipidemia Father   . Hypertension Father   . Stroke Son   . Diabetes Maternal Aunt   . Prostate cancer Paternal Uncle   . Heart disease Maternal Aunt   . Breast cancer Neg Hx      ROS:  Please see the history of present illness.   All other ROS reviewed and negative.     Physical Exam/Data:   Vitals:   10/15/18 0004 10/15/18 0350 10/15/18 0946 10/15/18 1217  BP: (!) 143/62 130/61 (!) 143/61 (!) 141/61  Pulse: 66 60 71 65  Resp: 18 20 17 20   Temp: 98.2 F (36.8 C) 98.3 F (36.8 C) 98.6 F (37 C) 98.6 F (37 C)  TempSrc: Oral Oral Oral Oral  SpO2: 98% 100% 98% 100%  Weight:      Height:       No intake or output data in the 24 hours ending 10/15/18 1514 Filed Weights   10/13/18 2154 10/13/18 2326  Weight: 127.2 kg 125.8 kg   Body mass index is 43.44 kg/m.  General:  Obese AAF sitting in bedside chair in no acute distress HEENT: sclera anicteric  Neck: no JVD Vascular: No carotid bruits; distal pulses 2+ bilaterally Cardiac:  normal S1, S2; RRR; no murmurs,  rubs, or gallops Lungs:  clear to auscultation bilaterally, no wheezing, rhonchi or rales  Abd: NABS, soft, nontender, no hepatomegaly Ext: 1+ edema Musculoskeletal:  No deformities, BUE and BLE strength normal and equal Skin: warm and dry  Neuro:  CNs 2-12 intact, no focal abnormalities noted Psych:  Normal affect   EKG:  The EKG was personally reviewed and demonstrates:  Sinus rhythm with PACs, no STE/D, no TWI; QTC 497 Telemetry:  Telemetry was personally reviewed and demonstrates:  Sinus rhythm with frequent ectopy. No clear cut episodes of lasting atrial fibrillation. At most, a few seconds of atrial fibrillation noted.   Relevant CV Studies: Echocardiogram 10/14/18: Study Conclusions  - Procedure narrative: Transthoracic echocardiography. Technically   difficult study. Intravenous contrast (Definity) was   administered. - Left ventricle: The cavity size was normal. There was moderate   concentric hypertrophy. Systolic function was normal. The   estimated ejection fraction was in the range of 60% to 65%. Wall   motion was normal; there were no regional wall motion   abnormalities. The study is not technically sufficient to allow   evaluation of LV diastolic function. - Left atrium: The atrium was normal in size. - Right atrium: The atrium was normal in size.  Impressions:  - Technically difficult study. Afib is noted. LVEF 60-65%, moderate   LVH, normal wall motion, normal biatrial size.  Laboratory Data:  Chemistry Recent Labs  Lab 10/13/18 2131 10/13/18 2135 10/14/18 0439 10/15/18 0425  NA 136 140 141 139  K 3.5 3.7 3.7 3.6  CL 101 102 109 106  CO2 24  --  25 24  GLUCOSE 312* 310* 257* 207*  BUN 14 16 11 14   CREATININE 1.04* 0.90 0.85 0.87  CALCIUM 8.9  --  8.9  9.0  GFRNONAA 54*  --  >60 >60  GFRAA >60  --  >60 >60  ANIONGAP 11  --  7 9    Recent Labs  Lab 10/13/18 2131  PROT 6.8  ALBUMIN 3.1*  AST 22  ALT 22  ALKPHOS 74  BILITOT 0.5    Hematology Recent Labs  Lab 10/13/18 2131 10/13/18 2135 10/14/18 0439 10/15/18 0425  WBC 5.3  --  4.7 4.4  RBC 3.96  --  3.71* 3.79*  HGB 11.1* 11.9* 10.3* 10.6*  HCT 36.6 35.0* 33.3* 34.4*  MCV 92.4  --  89.8 90.8  MCH 28.0  --  27.8 28.0  MCHC 30.3  --  30.9 30.8  RDW 12.1  --  12.1 12.3  PLT 231  --  228 236   Cardiac EnzymesNo results for input(s): TROPONINI in the last 168 hours.  Recent Labs  Lab 10/13/18 2134  TROPIPOC 0.01    BNPNo results for input(s): BNP, PROBNP in the last 168 hours.  DDimer No results for input(s): DDIMER in the last 168 hours.  Radiology/Studies:  Ct Angio Head W Or Wo Contrast  Result Date: 10/14/2018 CLINICAL DATA:  67 year old female code stroke presentation yesterday with left side deficits. EXAM: CT ANGIOGRAPHY HEAD AND NECK TECHNIQUE: Multidetector CT imaging of the head and neck was performed using the standard protocol during bolus administration of intravenous contrast. Multiplanar CT image reconstructions and MIPs were obtained to evaluate the vascular anatomy. Carotid stenosis measurements (when applicable) are obtained utilizing NASCET criteria, using the distal internal carotid diameter as the denominator. CONTRAST:  41mL ISOVUE-370 IOPAMIDOL (ISOVUE-370) INJECTION 76% COMPARISON:  Head CT without contrast 10/13/2018. CTA head and neck 08/29/2015 and 08/30/2015. FINDINGS: CTA NECK Skeleton: No acute osseous abnormality identified. Visualized paranasal sinuses and mastoids are stable and well pneumatized. Largely absent dentition. Upper chest: Negative upper lungs. No superior mediastinal lymphadenopathy. Other neck: Surgically absent thyroid as before. Otherwise negative. Aortic arch: Mild Calcified aortic atherosclerosis. Bovine type arch configuration. Stable great vessel origins since 2016 with no origin stenosis. Right carotid system: Stable mild tortuosity of the proximal right CCA. Minimal right CCA and right carotid bifurcation  plaque with no right carotid stenosis in the neck. Mildly tortuous cervical right ICA. Left carotid system: Mild left CCA tortuosity. Chronic left CCA mostly calcified plaque just above the level of the thyroid with no stenosis, stable. Mild mostly calcified plaque at the left carotid bifurcation is stable with no stenosis. Vertebral arteries: No proximal right subclavian artery stenosis. Calcified plaque near the right vertebral artery origin, but no origin stenosis. Mildly tortuous right vertebral artery is patent to the skull base without stenosis. No proximal left subclavian artery stenosis despite soft and calcified plaque. Similar mild soft and calcified plaque near the left vertebral artery origin but no origin stenosis. Patent and occasionally tortuous left vertebral artery to the skull base without stenosis. CTA HEAD Posterior circulation: Moderate to severe bilateral V4 segment calcified plaque is re- demonstrated with associated moderate to severe bilateral V4 segment stenosis distal to both PICA origins which remain patent (series 7, image 137 on the left and image 132 on the right). The distal V4 segments then remain normal. Patent vertebrobasilar junction and basilar artery without stenosis. SCA and PCA origins are patent and within normal limits. Small left posterior communicating artery is present. The right is diminutive or absent. Bilateral PCA branches are within normal limits. Anterior circulation: Moderate to severe bilateral ICA siphon calcified atherosclerosis redemonstrated. Both ophthalmic  artery origins remain patent. Hemodynamically significant stenosis on the right suspected at the junction of the vertical petrous and cavernous segments, with moderate right supraclinoid the stenosis. These segments appears stable. On the left side hemodynamically significant siphon stenosis is suspected in the proximal and distal cavernous segment. The segments appears stable. Carotid termini remain  patent. MCA and ACA origins are normal. Anterior communicating artery and bilateral ACA branches are stable and within normal limits. Left MCA M1 segment, bifurcation, and left MCA branches are stable and within normal limits. Right MCA M1 segment, bifurcation, and right MCA branches are stable and within normal limits. Venous sinuses: Patent. Anatomic variants: Bovine type aortic arch configuration. Delayed phase: Gray-white matter differentiation in the right hemisphere peers stable and within normal limits. No acute or evolving infarct is identified. No intracranial mass effect, ventriculomegaly, or hemorrhage identified. No abnormal enhancement identified. Review of the MIP images confirms the above findings IMPRESSION: 1. 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 There is comparatively little extracranial atherosclerosis, and no plaque or stenosis identified in the circle of Willis branches. 2. Stable CT appearance of the brain since yesterday. No acute or evolving infarct identified. Electronically Signed   By: Genevie Ann M.D.   On: 10/14/2018 09:27   Dg Chest 2 View  Result Date: 10/14/2018 CLINICAL DATA:  Stroke x last night around 830pm, left sided numbness from neck down to her feet, HTN, diabetic, nonsmoker EXAM: CHEST - 2 VIEW COMPARISON:  04/03/2016 FINDINGS: Central pulmonary vascular congestion. No confluent airspace disease. Moderate cardiomegaly stable.  Aortic Atherosclerosis (ICD10-170.0). No effusion. Osteopenia with stable mild midthoracic compression deformity. IMPRESSION: Stable cardiomegaly and central pulmonary vascular Electronically Signed   By: Lucrezia Europe M.D.   On: 10/14/2018 08:45   Ct Angio Neck W Or Wo Contrast  Result Date: 10/14/2018 CLINICAL DATA:  67 year old female code stroke presentation yesterday with left side deficits. EXAM: CT  ANGIOGRAPHY HEAD AND NECK TECHNIQUE: Multidetector CT imaging of the head and neck was performed using the standard protocol during bolus administration of intravenous contrast. Multiplanar CT image reconstructions and MIPs were obtained to evaluate the vascular anatomy. Carotid stenosis measurements (when applicable) are obtained utilizing NASCET criteria, using the distal internal carotid diameter as the denominator. CONTRAST:  57mL ISOVUE-370 IOPAMIDOL (ISOVUE-370) INJECTION 76% COMPARISON:  Head CT without contrast 10/13/2018. CTA head and neck 08/29/2015 and 08/30/2015. FINDINGS: CTA NECK Skeleton: No acute osseous abnormality identified. Visualized paranasal sinuses and mastoids are stable and well pneumatized. Largely absent dentition. Upper chest: Negative upper lungs. No superior mediastinal lymphadenopathy. Other neck: Surgically absent thyroid as before. Otherwise negative. Aortic arch: Mild Calcified aortic atherosclerosis. Bovine type arch configuration. Stable great vessel origins since 2016 with no origin stenosis. Right carotid system: Stable mild tortuosity of the proximal right CCA. Minimal right CCA and right carotid bifurcation plaque with no right carotid stenosis in the neck. Mildly tortuous cervical right ICA. Left carotid system: Mild left CCA tortuosity. Chronic left CCA mostly calcified plaque just above the level of the thyroid with no stenosis, stable. Mild mostly calcified plaque at the left carotid bifurcation is stable with no stenosis. Vertebral arteries: No proximal right subclavian artery stenosis. Calcified plaque near the right vertebral artery origin, but no origin stenosis. Mildly tortuous right vertebral artery is patent to the skull base without stenosis. No proximal left subclavian artery  stenosis despite soft and calcified plaque. Similar mild soft and calcified plaque near the left vertebral artery origin but no origin stenosis. Patent and occasionally tortuous left  vertebral artery to the skull base without stenosis. CTA HEAD Posterior circulation: Moderate to severe bilateral V4 segment calcified plaque is re- demonstrated with associated moderate to severe bilateral V4 segment stenosis distal to both PICA origins which remain patent (series 7, image 137 on the left and image 132 on the right). The distal V4 segments then remain normal. Patent vertebrobasilar junction and basilar artery without stenosis. SCA and PCA origins are patent and within normal limits. Small left posterior communicating artery is present. The right is diminutive or absent. Bilateral PCA branches are within normal limits. Anterior circulation: Moderate to severe bilateral ICA siphon calcified atherosclerosis redemonstrated. Both ophthalmic artery origins remain patent. Hemodynamically significant stenosis on the right suspected at the junction of the vertical petrous and cavernous segments, with moderate right supraclinoid the stenosis. These segments appears stable. On the left side hemodynamically significant siphon stenosis is suspected in the proximal and distal cavernous segment. The segments appears stable. Carotid termini remain patent. MCA and ACA origins are normal. Anterior communicating artery and bilateral ACA branches are stable and within normal limits. Left MCA M1 segment, bifurcation, and left MCA branches are stable and within normal limits. Right MCA M1 segment, bifurcation, and right MCA branches are stable and within normal limits. Venous sinuses: Patent. Anatomic variants: Bovine type aortic arch configuration. Delayed phase: Gray-white matter differentiation in the right hemisphere peers stable and within normal limits. No acute or evolving infarct is identified. No intracranial mass effect, ventriculomegaly, or hemorrhage identified. No abnormal enhancement identified. Review of the MIP images confirms the above findings IMPRESSION: 1. 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 There is comparatively little extracranial atherosclerosis, and no plaque or stenosis identified in the circle of Willis branches. 2. Stable CT appearance of the brain since yesterday. No acute or evolving infarct identified. Electronically Signed   By: Genevie Ann M.D.   On: 10/14/2018 09:27   Mr Brain Wo Contrast  Result Date: 10/14/2018 CLINICAL DATA:  Follow up stroke. History of stroke, hyperlipidemia, hypertension, diabetes and thyroid cancer. EXAM: MRI HEAD WITHOUT CONTRAST TECHNIQUE: Multiplanar, multiecho pulse sequences of the brain and surrounding structures were obtained without intravenous contrast. COMPARISON:  CT HEAD October 14, 2018 and MRI head August 29, 2015 FINDINGS: Multiple sequences are moderately motion degraded. INTRACRANIAL CONTENTS: 6 mm acute RIGHT pontine infarct at superior cerebellar peduncle with low ADC values. A few scattered chronic microhemorrhages noted. Patchy supratentorial white matter T2 hyperintensities. Old pontine lacunar infarcts. Prominent basal ganglia perivascular spaces associated with chronic small vessel ischemic changes. No parenchymal brain volume loss for age. No midline shift, mass effect or masses. No abnormal extra-axial fluid collections. Basal cisterns are patent. VASCULAR: Normal major intracranial vascular flow voids present at skull base. SKULL AND UPPER CERVICAL SPINE: No abnormal sellar expansion. No suspicious calvarial bone marrow signal. Craniocervical junction maintained. SINUSES/ORBITS: The mastoid air-cells and included paranasal sinuses are well-aerated.The included ocular globes and orbital contents are non-suspicious. Status post bilateral ocular lens implants. OTHER: Patient is edentulous. IMPRESSION: 1. Motion degraded examination. Acute subcentimeter RIGHT pontine infarct. 2. Mild chronic small  vessel ischemic changes, old pontine lacunar infarcts. Electronically Signed   By: Thana Farr.D.  On: 10/14/2018 20:29   Ct Head Code Stroke Wo Contrast  Result Date: 10/13/2018 CLINICAL DATA:  Code stroke.  Left-sided weakness. EXAM: CT HEAD WITHOUT CONTRAST TECHNIQUE: Contiguous axial images were obtained from the base of the skull through the vertex without intravenous contrast. COMPARISON:  04/03/2016 FINDINGS: Brain: No evidence of acute infarction, hemorrhage, hydrocephalus, extra-axial collection or mass lesion/mass effect. 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 Mild, age congruent cerebral volume loss. Vascular: Atherosclerotic calcification.  No hyperdense vessel. Skull: Normal. Negative for fracture or focal lesion. Sinuses/Orbits: No acute finding. Other: These results were communicated to Dr. Leonel Ramsay at 9:41 pmon 10/20/2019by text page via the [facility] messaging system. ASPECTS [facility] Stroke Program Early CT Score) - Ganglionic level infarction (caudate, lentiform nuclei, internal capsule, insula, M1-M3 cortex): 7 - Supraganglionic infarction (M4-M6 cortex): 3 Total score (0-10 with 10 being normal): 10 IMPRESSION: 1. No acute finding.  ASPECTS is 10. 2. 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 Electronically Signed   By: Monte Fantasia M.D.   On: 10/13/2018 21:43    Assessment and Plan:   1. Frequent ectopy noted on telemetry: patient presented with acute stroke. Echo with EF 60-65%, moderate LVH, and evidence of atrial fibrillation. Telemetry reviewed at length and at most there is a few scattered seconds of monitoring concerning for atrial fibrillation, otherwise sinus rhythm with frequent ectopy. Suspicions are quite high that she has paroxysmal atrial fibrillation, however at this time there is not enough evidence to warrant starting an anticoagulant.  - Will plan for TEE tomorrow to assess for evidence of clot - risks/benefits discussed with the patient and she is agreeable  to proceed. On the schedule for 10/16/18 at 3pm with Dr. Harrell Gave.  - If no evidence of clot, would pursue loop recorder for long term monitoring - Will not start anticoagulation at this time.   2. Stroke: patient presented 10/13/18 with left sided numbness. Found to have an acute subcentimeter R pontine infarct. Neurology following. Suspicions are quite high that she has paroxysmal atrial fibrillation, however at this time there is not enough evidence to warrant starting an anticoagulant.  - Continue aspirin/plavix per neurology - TEE +/- loop as above  3. HTN: allowing permissive hypertension. Home HCTZ on hold.  - Continue losartan - Would restart HCTZ when cleared by neurology given mild LE edema on exam.  4. HLD: LDL 48 this admission.  - Atrovastatin increased to 80mg  daily this admission  5. DM type 1: poorly controlled with A1C 9.8. Follows outpatient with Endocrinology - Continue management per primary team and close outpatient follow-up.   6. Suspect OSA: no prior sleep study but patient has a body habitus concerning for OSA and if unmanaged, at risk for further cardiac issues.  - Would benefit from an outpatient sleep study.  For questions or updates, please contact [place] Please consult [email or web address] for contact info under Cardiology/STEMI.   Signed, Abigail Butts, PA-C  10/15/2018 3:14 PM [phone]  The patient was seen, examined and discussed with Abigail Butts, PA-C  and I agree with the above.   67 y.o. female with a PMH of previous CVA in 2016, HTN, HLD, DM type 1, thyroid cancer s/p thyroidectomy, and GERD who is being seen today for the evaluation of atrial fibrillation.  The patient was admitted on October 20 when she developed symptoms of left-sided weakness, MRI showed acute ischemic stroke.  The patient has been placed on telemetry that that I have personally reviewed and it  shows very frequent supraventricular ectopy and one short run of  supraventricular tachycardia possibly atrial fibrillation for no longer than 10 seconds.  Patient denies any palpitations dizziness or syncope at home. Her baseline EKG shows sinus rhythm with frequent PACs and nonspecific ST-T wave abnormalities.  I have reviewed her echocardiogram that shows hyperdynamic LVEF of 65 to 70%, mild concentric hypertrophy, mildly dilated left atrium.  She has normal RV size and function, and her right-sided pressures are normal.  She was last evaluated by cardiology, Dr. Marlou [name], in 2017 who presented for evaluation of CHF. Per note, no evidence of CHF but was felt to have secondary pulmonary hypertension due to her morbid obesity. She had a R/LHC in 2013 which confirmed elevated pulmonary pressures and normal coronaries. She was recommended to undergo healthy lifestyle and dietary modifications and follow-up as needed.  He has never been evaluated for sleep apnea.   She denies palpitations, chest pain, SOB, orthopnea, PND, she has mild lower extremity edema  Assessment and plan Recurrent ischemic stroke, Frequent PACs and short runs of SVT however no definite diagnosis of atrial fibrillation, however atrial fibrillation highly likely We will plan for TEE to rule out clot (she is scheduled for Wednesday, October 22 at 3 PM) and consult EP for consideration of placement of a loop recorder. Continue aspirin and Plavix for now.  Ena Dawley, MD 10/15/2018

## 2018-10-15 NOTE — Progress Notes (Signed)
Placed referral for second opinion for ophthalmology

## 2018-10-15 NOTE — Progress Notes (Signed)
Patient requested cbg done when she got her breakfast tray as what she does at home everyday.  CBG 182.  3.6 delivered via insulin pump per patient.

## 2018-10-15 NOTE — Consult Note (Addendum)
Cardiology Consultation:   Patient ID: [name]; [id number]; [birth year]   Admit date: 10/13/2018 Date of Consult: 10/15/2018  Primary Care Provider: Shirley, [name], DO Primary Cardiologist: Dr. Marlou [name] Primary Electrophysiologist:  None   Patient Profile:   [name] is a 67 y.o. female with a PMH of previous CVA in 2016, HTN, HLD, DM type 1, thyroid cancer s/p thyroidectomy, and GERD who is being seen today for the evaluation of atrial fibrillation at the request of Dr. Nori [name].  History of Present Illness:   [name] was in her usual state of health until the evening of 10/13/18 when she experienced sudden onset left sided weakness. She was found to have an acute stroke on MRI. She has a history of previous stroke in 2016 but no evidence of atrial fibrillation at that time.   She was last evaluated by cardiology, Dr. Marlou [name], in 2017 who presented for evaluation of CHF. Per note, no evidence of CHF but was felt to have secondary pulmonary hypertension due to her morbid obesity. She had a R/LHC in 2013 which confirmed elevated pulmonary pressures and normal coronaries. She was recommended to undergo healthy lifestyle and dietary modifications and follow-up as needed.   She denies palpitations, chest pain, SOB, orthopnea, PND, or LE edema. She has never been evaluated for sleep apnea in the past. She states she has been told she has an abnormal heart beat but never been diagnosed with atrial fibrillation/flutter.     Past Medical History:  Diagnosis Date  . Anemia   . Arthritis   . CHF (congestive heart failure) ([place])   . Depression   . GERD (gastroesophageal reflux disease)   . Hemorrhoids   . Hyperlipidemia   . Hypertension   . Hypothyroidism   . Seasonal allergies   . Stroke ([place])   . Thyroid cancer ([place])    per pt S/p Total Thyroidectomy with Radioactive Iodine Therapy  . Type 1 diabetes mellitus ([place])     Past Surgical History:    Procedure Laterality Date  . ABDOMINAL ANGIOGRAM N/A 05/14/2012   Procedure: ABDOMINAL ANGIOGRAM;  Surgeon: [name] Page, MD;  Location: [facility] CATH LAB;  Service: Cardiovascular;  Laterality: N/A;  . ABDOMINAL HYSTERECTOMY  1995   partial  . ANKLE FRACTURE SURGERY Right   . CARDIAC CATHETERIZATION     with coronary angiogram  . COLONOSCOPY N/A 09/09/2014   Procedure: COLONOSCOPY;  Surgeon: [name] Mayer, MD;  Location: [facility];  Service: Endoscopy;  Laterality: N/A;  . LEFT AND RIGHT HEART CATHETERIZATION WITH CORONARY ANGIOGRAM N/A 05/14/2012   Procedure: LEFT AND RIGHT HEART CATHETERIZATION WITH CORONARY ANGIOGRAM;  Surgeon: [name] Page, MD;  Location: [facility] CATH LAB;  Service: Cardiovascular;  Laterality: N/A;  . MINI METER   09/08/2014   ELECTRONIC INSULIN PUMP  . THYROIDECTOMY  2006     Home Medications:  Prior to Admission medications   Medication Sig Start Date End Date Taking? Authorizing Provider  atorvastatin (LIPITOR) 40 MG tablet TAKE 1 TABLET(40 MG) BY MOUTH DAILY Patient taking differently: Take 40 mg by mouth daily at 6 PM.  09/30/18  Yes [name], [name], DO  Besifloxacin HCl (BESIVANCE) 0.6 % SUSP Place 1 drop into both eyes See admin instructions. 4 times daily for 2 days after eye injection   Yes [provider]  clopidogrel (PLAVIX) 75 MG tablet TAKE 1 TABLET BY MOUTH EVERY DAY Patient taking differently: Take 75 mg by mouth daily.  07/15/18  Yes [name], [name], DO  CRANBERRY PO Take 1 tablet by mouth daily.   Yes [provider]  fexofenadine (ALLEGRA) 30 MG tablet Take 30 mg by mouth daily as needed (allergies).    Yes [provider]  Insulin Human (INSULIN PUMP) SOLN Inject into the skin continuous. humalog insulin   Yes [provider]  levothyroxine (SYNTHROID, LEVOTHROID) 175 MCG tablet Take 175 mcg by mouth daily. 06/06/17  Yes [provider]  losartan-hydrochlorothiazide (HYZAAR) 100-12.5 MG tablet Take 1  tablet by mouth daily. 01/30/18  Yes [name] Dawn, MD  multivitamin-lutein [facility]) CAPS capsule Take 1 capsule by mouth daily.   Yes [provider]  Potassium 99 MG TABS Take 99 mg by mouth daily.   Yes [provider]  ranitidine (ZANTAC) 150 MG tablet TAKE 1 TABLET BY MOUTH TWICE DAILY Patient taking differently: Take 150 mg by mouth 2 (two) times daily.  09/23/18  Yes [name], [name], DO    Inpatient Medications: Scheduled Meds: .  stroke: mapping our early stages of recovery book   Does not apply Once  . aspirin  300 mg Rectal Daily   Or  . aspirin  325 mg Oral Daily  . atorvastatin  80 mg Oral Daily  . clopidogrel  75 mg Oral Daily  . enoxaparin (LOVENOX) injection  40 mg Subcutaneous Q24H  . famotidine  10 mg Oral Daily  . insulin pump   Subcutaneous TID AC, HS, 0200  . levothyroxine  175 mcg Oral QAC breakfast  . losartan  25 mg Oral Daily   Continuous Infusions: . sodium chloride Stopped (10/14/18 0645)   PRN Meds: acetaminophen **OR** acetaminophen (TYLENOL) oral liquid 160 mg/5 mL **OR** acetaminophen, senna-docusate  Allergies:   No Known Allergies  Social History:   Social History   Socioeconomic History  . Marital status: Divorced    Spouse name: Not on file  . Number of children: 1  . Years of education: Not on file  . Highest education level: Not on file  Occupational History  . Occupation: retired Tour manager  . Financial resource strain: Not on file  . Food insecurity:    Worry: Not on file    Inability: Not on file  . Transportation needs:    Medical: Not on file    Non-medical: Not on file  Tobacco Use  . Smoking status: Never Smoker  . Smokeless tobacco: Never Used  Substance and Sexual Activity  . Alcohol use: No  . Drug use: No  . Sexual activity: Not on file  Lifestyle  . Physical activity:    Days per week: Not on file    Minutes per session: Not on file  . Stress: Not on file  Relationships  .  Social connections:    Talks on phone: Not on file    Gets together: Not on file    Attends religious service: Not on file    Active member of club or organization: Not on file    Attends meetings of clubs or organizations: Not on file    Relationship status: Not on file  . Intimate partner violence:    Fear of current or ex partner: Not on file    Emotionally abused: Not on file    Physically abused: Not on file    Forced sexual activity: Not on file  Other Topics Concern  . Not on file  Social History Narrative   Retired Pharmacist, hospital, high school teacher taught mass. One son,  helping to care for her granddaughter. No caffeine. Updated 07/20/2014.    Family History:    Family History  Problem Relation Age of Onset  . Heart disease Mother        MI  . Hyperlipidemia Mother   . Hypertension Mother   . Renal Disease Mother        insufficiency  . Early death Father        Head Injury at Work  . Hyperlipidemia Father   . Hypertension Father   . Stroke Son   . Diabetes Maternal Aunt   . Prostate cancer Paternal Uncle   . Heart disease Maternal Aunt   . Breast cancer Neg Hx      ROS:  Please see the history of present illness.   All other ROS reviewed and negative.     Physical Exam/Data:   Vitals:   10/15/18 0004 10/15/18 0350 10/15/18 0946 10/15/18 1217  BP: (!) 143/62 130/61 (!) 143/61 (!) 141/61  Pulse: 66 60 71 65  Resp: 18 20 17 20   Temp: 98.2 F (36.8 C) 98.3 F (36.8 C) 98.6 F (37 C) 98.6 F (37 C)  TempSrc: Oral Oral Oral Oral  SpO2: 98% 100% 98% 100%  Weight:      Height:       No intake or output data in the 24 hours ending 10/15/18 1514 Filed Weights   10/13/18 2154 10/13/18 2326  Weight: 127.2 kg 125.8 kg   Body mass index is 43.44 kg/m.  General:  Obese AAF sitting in bedside chair in no acute distress HEENT: sclera anicteric  Neck: no JVD Vascular: No carotid bruits; distal pulses 2+ bilaterally Cardiac:  normal S1, S2; RRR; no murmurs,  rubs, or gallops Lungs:  clear to auscultation bilaterally, no wheezing, rhonchi or rales  Abd: NABS, soft, nontender, no hepatomegaly Ext: 1+ edema Musculoskeletal:  No deformities, BUE and BLE strength normal and equal Skin: warm and dry  Neuro:  CNs 2-12 intact, no focal abnormalities noted Psych:  Normal affect   EKG:  The EKG was personally reviewed and demonstrates:  Sinus rhythm with PACs, no STE/D, no TWI; QTC 497 Telemetry:  Telemetry was personally reviewed and demonstrates:  Sinus rhythm with frequent ectopy. No clear cut episodes of lasting atrial fibrillation. At most, a few seconds of atrial fibrillation noted.   Relevant CV Studies: Echocardiogram 10/14/18: Study Conclusions  - Procedure narrative: Transthoracic echocardiography. Technically   difficult study. Intravenous contrast (Definity) was   administered. - Left ventricle: The cavity size was normal. There was moderate   concentric hypertrophy. Systolic function was normal. The   estimated ejection fraction was in the range of 60% to 65%. Wall   motion was normal; there were no regional wall motion   abnormalities. The study is not technically sufficient to allow   evaluation of LV diastolic function. - Left atrium: The atrium was normal in size. - Right atrium: The atrium was normal in size.  Impressions:  - Technically difficult study. Afib is noted. LVEF 60-65%, moderate   LVH, normal wall motion, normal biatrial size.  Laboratory Data:  Chemistry Recent Labs  Lab 10/13/18 2131 10/13/18 2135 10/14/18 0439 10/15/18 0425  NA 136 140 141 139  K 3.5 3.7 3.7 3.6  CL 101 102 109 106  CO2 24  --  25 24  GLUCOSE 312* 310* 257* 207*  BUN 14 16 11 14   CREATININE 1.04* 0.90 0.85 0.87  CALCIUM 8.9  --  8.9  9.0  GFRNONAA 54*  --  >60 >60  GFRAA >60  --  >60 >60  ANIONGAP 11  --  7 9    Recent Labs  Lab 10/13/18 2131  PROT 6.8  ALBUMIN 3.1*  AST 22  ALT 22  ALKPHOS 74  BILITOT 0.5    Hematology Recent Labs  Lab 10/13/18 2131 10/13/18 2135 10/14/18 0439 10/15/18 0425  WBC 5.3  --  4.7 4.4  RBC 3.96  --  3.71* 3.79*  HGB 11.1* 11.9* 10.3* 10.6*  HCT 36.6 35.0* 33.3* 34.4*  MCV 92.4  --  89.8 90.8  MCH 28.0  --  27.8 28.0  MCHC 30.3  --  30.9 30.8  RDW 12.1  --  12.1 12.3  PLT 231  --  228 236   Cardiac EnzymesNo results for input(s): TROPONINI in the last 168 hours.  Recent Labs  Lab 10/13/18 2134  TROPIPOC 0.01    BNPNo results for input(s): BNP, PROBNP in the last 168 hours.  DDimer No results for input(s): DDIMER in the last 168 hours.  Radiology/Studies:  Ct Angio Head W Or Wo Contrast  Result Date: 10/14/2018 CLINICAL DATA:  67 year old female code stroke presentation yesterday with left side deficits. EXAM: CT ANGIOGRAPHY HEAD AND NECK TECHNIQUE: Multidetector CT imaging of the head and neck was performed using the standard protocol during bolus administration of intravenous contrast. Multiplanar CT image reconstructions and MIPs were obtained to evaluate the vascular anatomy. Carotid stenosis measurements (when applicable) are obtained utilizing NASCET criteria, using the distal internal carotid diameter as the denominator. CONTRAST:  46mL ISOVUE-370 IOPAMIDOL (ISOVUE-370) INJECTION 76% COMPARISON:  Head CT without contrast 10/13/2018. CTA head and neck 08/29/2015 and 08/30/2015. FINDINGS: CTA NECK Skeleton: No acute osseous abnormality identified. Visualized paranasal sinuses and mastoids are stable and well pneumatized. Largely absent dentition. Upper chest: Negative upper lungs. No superior mediastinal lymphadenopathy. Other neck: Surgically absent thyroid as before. Otherwise negative. Aortic arch: Mild Calcified aortic atherosclerosis. Bovine type arch configuration. Stable great vessel origins since 2016 with no origin stenosis. Right carotid system: Stable mild tortuosity of the proximal right CCA. Minimal right CCA and right carotid bifurcation  plaque with no right carotid stenosis in the neck. Mildly tortuous cervical right ICA. Left carotid system: Mild left CCA tortuosity. Chronic left CCA mostly calcified plaque just above the level of the thyroid with no stenosis, stable. Mild mostly calcified plaque at the left carotid bifurcation is stable with no stenosis. Vertebral arteries: No proximal right subclavian artery stenosis. Calcified plaque near the right vertebral artery origin, but no origin stenosis. Mildly tortuous right vertebral artery is patent to the skull base without stenosis. No proximal left subclavian artery stenosis despite soft and calcified plaque. Similar mild soft and calcified plaque near the left vertebral artery origin but no origin stenosis. Patent and occasionally tortuous left vertebral artery to the skull base without stenosis. CTA HEAD Posterior circulation: Moderate to severe bilateral V4 segment calcified plaque is re- demonstrated with associated moderate to severe bilateral V4 segment stenosis distal to both PICA origins which remain patent (series 7, image 137 on the left and image 132 on the right). The distal V4 segments then remain normal. Patent vertebrobasilar junction and basilar artery without stenosis. SCA and PCA origins are patent and within normal limits. Small left posterior communicating artery is present. The right is diminutive or absent. Bilateral PCA branches are within normal limits. Anterior circulation: Moderate to severe bilateral ICA siphon calcified atherosclerosis redemonstrated. Both ophthalmic  artery origins remain patent. Hemodynamically significant stenosis on the right suspected at the junction of the vertical petrous and cavernous segments, with moderate right supraclinoid the stenosis. These segments appears stable. On the left side hemodynamically significant siphon stenosis is suspected in the proximal and distal cavernous segment. The segments appears stable. Carotid termini remain  patent. MCA and ACA origins are normal. Anterior communicating artery and bilateral ACA branches are stable and within normal limits. Left MCA M1 segment, bifurcation, and left MCA branches are stable and within normal limits. Right MCA M1 segment, bifurcation, and right MCA branches are stable and within normal limits. Venous sinuses: Patent. Anatomic variants: Bovine type aortic arch configuration. Delayed phase: Gray-white matter differentiation in the right hemisphere peers stable and within normal limits. No acute or evolving infarct is identified. No intracranial mass effect, ventriculomegaly, or hemorrhage identified. No abnormal enhancement identified. Review of the MIP images confirms the above findings IMPRESSION: 1. 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 There is comparatively little extracranial atherosclerosis, and no plaque or stenosis identified in the circle of Willis branches. 2. Stable CT appearance of the brain since yesterday. No acute or evolving infarct identified. Electronically Signed   By: Genevie Ann M.D.   On: 10/14/2018 09:27   Dg Chest 2 View  Result Date: 10/14/2018 CLINICAL DATA:  Stroke x last night around 830pm, left sided numbness from neck down to her feet, HTN, diabetic, nonsmoker EXAM: CHEST - 2 VIEW COMPARISON:  04/03/2016 FINDINGS: Central pulmonary vascular congestion. No confluent airspace disease. Moderate cardiomegaly stable.  Aortic Atherosclerosis (ICD10-170.0). No effusion. Osteopenia with stable mild midthoracic compression deformity. IMPRESSION: Stable cardiomegaly and central pulmonary vascular Electronically Signed   By: Lucrezia Europe M.D.   On: 10/14/2018 08:45   Ct Angio Neck W Or Wo Contrast  Result Date: 10/14/2018 CLINICAL DATA:  67 year old female code stroke presentation yesterday with left side deficits. EXAM: CT  ANGIOGRAPHY HEAD AND NECK TECHNIQUE: Multidetector CT imaging of the head and neck was performed using the standard protocol during bolus administration of intravenous contrast. Multiplanar CT image reconstructions and MIPs were obtained to evaluate the vascular anatomy. Carotid stenosis measurements (when applicable) are obtained utilizing NASCET criteria, using the distal internal carotid diameter as the denominator. CONTRAST:  52mL ISOVUE-370 IOPAMIDOL (ISOVUE-370) INJECTION 76% COMPARISON:  Head CT without contrast 10/13/2018. CTA head and neck 08/29/2015 and 08/30/2015. FINDINGS: CTA NECK Skeleton: No acute osseous abnormality identified. Visualized paranasal sinuses and mastoids are stable and well pneumatized. Largely absent dentition. Upper chest: Negative upper lungs. No superior mediastinal lymphadenopathy. Other neck: Surgically absent thyroid as before. Otherwise negative. Aortic arch: Mild Calcified aortic atherosclerosis. Bovine type arch configuration. Stable great vessel origins since 2016 with no origin stenosis. Right carotid system: Stable mild tortuosity of the proximal right CCA. Minimal right CCA and right carotid bifurcation plaque with no right carotid stenosis in the neck. Mildly tortuous cervical right ICA. Left carotid system: Mild left CCA tortuosity. Chronic left CCA mostly calcified plaque just above the level of the thyroid with no stenosis, stable. Mild mostly calcified plaque at the left carotid bifurcation is stable with no stenosis. Vertebral arteries: No proximal right subclavian artery stenosis. Calcified plaque near the right vertebral artery origin, but no origin stenosis. Mildly tortuous right vertebral artery is patent to the skull base without stenosis. No proximal left subclavian artery  stenosis despite soft and calcified plaque. Similar mild soft and calcified plaque near the left vertebral artery origin but no origin stenosis. Patent and occasionally tortuous left  vertebral artery to the skull base without stenosis. CTA HEAD Posterior circulation: Moderate to severe bilateral V4 segment calcified plaque is re- demonstrated with associated moderate to severe bilateral V4 segment stenosis distal to both PICA origins which remain patent (series 7, image 137 on the left and image 132 on the right). The distal V4 segments then remain normal. Patent vertebrobasilar junction and basilar artery without stenosis. SCA and PCA origins are patent and within normal limits. Small left posterior communicating artery is present. The right is diminutive or absent. Bilateral PCA branches are within normal limits. Anterior circulation: Moderate to severe bilateral ICA siphon calcified atherosclerosis redemonstrated. Both ophthalmic artery origins remain patent. Hemodynamically significant stenosis on the right suspected at the junction of the vertical petrous and cavernous segments, with moderate right supraclinoid the stenosis. These segments appears stable. On the left side hemodynamically significant siphon stenosis is suspected in the proximal and distal cavernous segment. The segments appears stable. Carotid termini remain patent. MCA and ACA origins are normal. Anterior communicating artery and bilateral ACA branches are stable and within normal limits. Left MCA M1 segment, bifurcation, and left MCA branches are stable and within normal limits. Right MCA M1 segment, bifurcation, and right MCA branches are stable and within normal limits. Venous sinuses: Patent. Anatomic variants: Bovine type aortic arch configuration. Delayed phase: Gray-white matter differentiation in the right hemisphere peers stable and within normal limits. No acute or evolving infarct is identified. No intracranial mass effect, ventriculomegaly, or hemorrhage identified. No abnormal enhancement identified. Review of the MIP images confirms the above findings IMPRESSION: 1. 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 There is comparatively little extracranial atherosclerosis, and no plaque or stenosis identified in the circle of Willis branches. 2. Stable CT appearance of the brain since yesterday. No acute or evolving infarct identified. Electronically Signed   By: Genevie Ann M.D.   On: 10/14/2018 09:27   Mr Brain Wo Contrast  Result Date: 10/14/2018 CLINICAL DATA:  Follow up stroke. History of stroke, hyperlipidemia, hypertension, diabetes and thyroid cancer. EXAM: MRI HEAD WITHOUT CONTRAST TECHNIQUE: Multiplanar, multiecho pulse sequences of the brain and surrounding structures were obtained without intravenous contrast. COMPARISON:  CT HEAD October 14, 2018 and MRI head August 29, 2015 FINDINGS: Multiple sequences are moderately motion degraded. INTRACRANIAL CONTENTS: 6 mm acute RIGHT pontine infarct at superior cerebellar peduncle with low ADC values. A few scattered chronic microhemorrhages noted. Patchy supratentorial white matter T2 hyperintensities. Old pontine lacunar infarcts. Prominent basal ganglia perivascular spaces associated with chronic small vessel ischemic changes. No parenchymal brain volume loss for age. No midline shift, mass effect or masses. No abnormal extra-axial fluid collections. Basal cisterns are patent. VASCULAR: Normal major intracranial vascular flow voids present at skull base. SKULL AND UPPER CERVICAL SPINE: No abnormal sellar expansion. No suspicious calvarial bone marrow signal. Craniocervical junction maintained. SINUSES/ORBITS: The mastoid air-cells and included paranasal sinuses are well-aerated.The included ocular globes and orbital contents are non-suspicious. Status post bilateral ocular lens implants. OTHER: Patient is edentulous. IMPRESSION: 1. Motion degraded examination. Acute subcentimeter RIGHT pontine infarct. 2. Mild chronic small  vessel ischemic changes, old pontine lacunar infarcts. Electronically Signed   By: Thana Farr.D.  On: 10/14/2018 20:29   Ct Head Code Stroke Wo Contrast  Result Date: 10/13/2018 CLINICAL DATA:  Code stroke.  Left-sided weakness. EXAM: CT HEAD WITHOUT CONTRAST TECHNIQUE: Contiguous axial images were obtained from the base of the skull through the vertex without intravenous contrast. COMPARISON:  04/03/2016 FINDINGS: Brain: No evidence of acute infarction, hemorrhage, hydrocephalus, extra-axial collection or mass lesion/mass effect. 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 Mild, age congruent cerebral volume loss. Vascular: Atherosclerotic calcification.  No hyperdense vessel. Skull: Normal. Negative for fracture or focal lesion. Sinuses/Orbits: No acute finding. Other: These results were communicated to Dr. Leonel Ramsay at 9:41 pmon 10/20/2019by text [name] via the [facility] messaging system. ASPECTS [facility] Stroke Program Early CT Score) - Ganglionic level infarction (caudate, lentiform nuclei, internal capsule, insula, M1-M3 cortex): 7 - Supraganglionic infarction (M4-M6 cortex): 3 Total score (0-10 with 10 being normal): 10 IMPRESSION: 1. No acute finding.  ASPECTS is 10. 2. 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 Electronically Signed   By: Monte Fantasia M.D.   On: 10/13/2018 21:43    Assessment and Plan:   1. Frequent ectopy noted on telemetry: patient presented with acute stroke. Echo with EF 60-65%, moderate LVH, and evidence of atrial fibrillation. Telemetry reviewed at length and at most there is a few scattered seconds of monitoring concerning for atrial fibrillation, otherwise sinus rhythm with frequent ectopy. Suspicions are quite high that she has paroxysmal atrial fibrillation, however at this time there is not enough evidence to warrant starting an anticoagulant.  - Will plan for TEE tomorrow to assess for evidence of clot - risks/benefits discussed with the patient and she is agreeable  to proceed. On the schedule for 10/16/18 at 3pm with Dr. Harrell Gave.  - If no evidence of clot, would pursue loop recorder for long term monitoring - Will not start anticoagulation at this time.   2. Stroke: patient presented 10/13/18 with left sided numbness. Found to have an acute subcentimeter R pontine infarct. Neurology following. Suspicions are quite high that she has paroxysmal atrial fibrillation, however at this time there is not enough evidence to warrant starting an anticoagulant.  - Continue aspirin/plavix per neurology - TEE +/- loop as above  3. HTN: allowing permissive hypertension. Home HCTZ on hold.  - Continue losartan - Would restart HCTZ when cleared by neurology given mild LE edema on exam.  4. HLD: LDL 48 this admission.  - Atrovastatin increased to 80mg  daily this admission  5. DM type 1: poorly controlled with A1C 9.8. Follows outpatient with Endocrinology - Continue management per primary team and close outpatient follow-up.   6. Suspect OSA: no prior sleep study but patient has a body habitus concerning for OSA and if unmanaged, at risk for further cardiac issues.  - Would benefit from an outpatient sleep study.  For questions or updates, please contact [facility] Please consult [email or web address] for contact info under Cardiology/STEMI.   Signed, Abigail Butts, PA-C  10/15/2018 3:14 PM [phone]  The patient was seen, examined and discussed with Abigail Butts, PA-C  and I agree with the above.   67 y.o. female with a PMH of previous CVA in 2016, HTN, HLD, DM type 1, thyroid cancer s/p thyroidectomy, and GERD who is being seen today for the evaluation of atrial fibrillation.  The patient was admitted on October 20 when she developed symptoms of left-sided weakness, MRI showed acute ischemic stroke.  The patient has been placed on telemetry that that I have personally reviewed and it  shows very frequent supraventricular ectopy and one short run of  supraventricular tachycardia possibly atrial fibrillation for no longer than 10 seconds.  Patient denies any palpitations dizziness or syncope at home. Her baseline EKG shows sinus rhythm with frequent PACs and nonspecific ST-T wave abnormalities.  I have reviewed her echocardiogram that shows hyperdynamic LVEF of 65 to 70%, mild concentric hypertrophy, mildly dilated left atrium.  She has normal RV size and function, and her right-sided pressures are normal.  She was last evaluated by cardiology, Dr. Marlou [name], in 2017 who presented for evaluation of CHF. Per note, no evidence of CHF but was felt to have secondary pulmonary hypertension due to her morbid obesity. She had a R/LHC in 2013 which confirmed elevated pulmonary pressures and normal coronaries. She was recommended to undergo healthy lifestyle and dietary modifications and follow-up as needed.  He has never been evaluated for sleep apnea.   She denies palpitations, chest pain, SOB, orthopnea, PND, she has mild lower extremity edema  Assessment and plan Recurrent ischemic stroke, Frequent PACs and short runs of SVT however no definite diagnosis of atrial fibrillation, however atrial fibrillation highly likely We will plan for TEE to rule out clot (she is scheduled for Wednesday, October 22 at 3 PM) and consult EP for consideration of placement of a loop recorder. Continue aspirin and Plavix for now.  Ena Dawley, MD 10/15/2018

## 2018-10-15 NOTE — Progress Notes (Signed)
Physical Therapy Treatment Patient Details Name: [name] MRN: [id number] DOB: March 05, 1951 Today's Date: 10/15/2018    History of Present Illness Patient is a 67 y/o female presenting to the ED on 10/13/18 wiht primary complaints of L sided weakness. CT head showing no acute findings. MRI positive:Acute subcentimeter RIGHT pontine infarct. Marland Kitchen PMH significant for previous stroke, T1DM, HLD, Hypothyroidism, HTN, GERD.     PT Comments    Pt tolerated treatment well, performed gait training with RW and no physical assist, able to ambulate without RW with minimal physical assistance to perform toileting tasks. Pt progressing towards goals, plan of care remains appropriate.    Follow Up Recommendations  Home health PT;Supervision - Intermittent     Equipment Recommendations  None recommended by PT    Recommendations for Other Services       Precautions / Restrictions Precautions Precautions: Fall Restrictions Weight Bearing Restrictions: No    Mobility  Bed Mobility               General bed mobility comments: seated in recliner upon entry  Transfers Overall transfer level: Needs assistance Equipment used: Rolling walker (2 wheeled) Transfers: Sit to/from Stand Sit to Stand: Supervision         General transfer comment: for safety and balance   Ambulation/Gait Ambulation/Gait assistance: Min guard Gait Distance (Feet): 200 Feet Assistive device: Rolling walker (2 wheeled) Gait Pattern/deviations: Step-to pattern;Step-through pattern;Decreased stride length;Decreased stance time - left;Decreased weight shift to left;Decreased dorsiflexion - left;Trunk flexed Gait velocity: decreased Gait velocity interpretation: <1.8 ft/sec, indicate of risk for recurrent falls General Gait Details: Pt supervision with RW to ambulate 150 ft without loss of balance, pt min guard without use of RW for safety and stability required verbal cues to increase step length  and maintain appropriate cadence, observed poor heel strike and toe off during ambulation   Stairs             Wheelchair Mobility    Modified Rankin (Stroke Patients Only) Modified Rankin (Stroke Patients Only) Pre-Morbid Rankin Score: No symptoms Modified Rankin: Moderately severe disability     Balance Overall balance assessment: Mild deficits observed, not formally tested Sitting-balance support: Bilateral upper extremity supported;Feet supported Sitting balance-Leahy Scale: Good     Standing balance support: No upper extremity supported Standing balance-Leahy Scale: Fair Standing balance comment: increased time to gain stability from sit to stand transfer                             Cognition Arousal/Alertness: Awake/alert Behavior During Therapy: WFL for tasks assessed/performed Overall Cognitive Status: Within Functional Limits for tasks assessed                                        Exercises      General Comments General comments (skin integrity, edema, etc.): pt mobilized to toilet at end of session, attempted bowel movement but was unable. Pt requested prune juice       Pertinent Vitals/Pain Pain Assessment: Faces Faces Pain Scale: Hurts a little bit Pain Location: back of thigh  Pain Descriptors / Indicators: Discomfort;Aching    Home Living Family/patient expects to be discharged to:: Private residence Living Arrangements: Other relatives(sister and granddaugther) Available Help at Discharge: Family;Available PRN/intermittently Type of Home: House Home Access: Stairs to enter Entrance Stairs-Rails: None Home Layout:  Two level;Able to live on main level with bedroom/bathroom Home Equipment: Walker - 4 wheels;Cane - single point;Bedside commode;Grab bars - tub/shower;Adaptive equipment      Prior Function Level of Independence: Independent with assistive device(s)      Comments: uses rollator for mobility, drives,  independent ADLS/IADLs   PT Goals (current goals can now be found in the care plan section) Acute Rehab PT Goals Patient Stated Goal: return home PT Goal Formulation: With patient Time For Goal Achievement: 10/28/18 Potential to Achieve Goals: Good Progress towards PT goals: Progressing toward goals    Frequency    Min 4X/week      PT Plan Current plan remains appropriate    Co-evaluation              AM-PAC PT "6 Clicks" Daily Activity  Outcome Measure  Difficulty turning over in bed (including adjusting bedclothes, sheets and blankets)?: A Little Difficulty moving from lying on back to sitting on the side of the bed? : A Little Difficulty sitting down on and standing up from a chair with arms (e.g., wheelchair, bedside commode, etc,.)?: A Little Help needed moving to and from a bed to chair (including a wheelchair)?: A Little Help needed walking in hospital room?: A Little Help needed climbing 3-5 steps with a railing? : A Lot 6 Click Score: 17    End of Session Equipment Utilized During Treatment: Gait belt Activity Tolerance: Patient tolerated treatment well Patient left: in chair;with call bell/phone within reach;with chair alarm set Nurse Communication: Mobility status PT Visit Diagnosis: Unsteadiness on feet (R26.81);Other abnormalities of gait and mobility (R26.89);Muscle weakness (generalized) (M62.81)     Time: 1624-4695 PT Time Calculation (min) (ACUTE ONLY): 22 min  Charges:  $Gait Training: 8-22 mins                     [name], [name]    [name] 10/15/2018, 2:52 PM

## 2018-10-15 NOTE — Discharge Summary (Signed)
[name]  Patient name: [name] Medical record number: [id number] Date of birth: 28-Jul-1951 Age: 67 y.o. Gender: female Date of Admission: 10/13/2018  Date of Discharge: 10/16/2018 Admitting Physician: Dickie La, MD  Primary Care Provider: Shirley, [name], DO Consultants: Neurology, Cardiology  Indication for Hospitalization: Left sided numbness  Discharge Diagnoses/Problem List:  Acute subcentimeter R pontine infarct Anemia HTN T1DM Irregular Heart beats, PAC's GERD H/o Thyroid cancer and s/p thyroidectomy  Disposition: Home  Discharge Condition: Stable  Discharge Exam:  Physical Exam:  Gen: NAD, alert, non-toxic, well-appearing, sitting comfortably in chair Skin: Warm and dry. No obvious rashes, lesions, or trauma. HEENT: NCAT No conjunctival pallor or injection. No scleral icterus or injection.  MMM.  CV: RRR.  <2s capillary refill bilaterally.  RP & DPs 2+ bilaterally. 1-2+ bilateral pitting edema. Resp: CTAB.  No wheezing, rales, abnormal lung sounds.  No increased WOB Abd: NTND on palpation to all 4 quadrants.  Positive bowel sounds. Psych: Cooperative with exam. Pleasant. Makes eye contact. Speech normal. Extremities: Moves all extremities spontaneously  Neuro: Strength 5/5 in BL upper and lower Extremeties  Brief [name] Course:  [name] is a 67 y.o. female with past medical history significant for CVA (2016), T1DM, HLD, Hypothyroidism, HTN, and GERD, who presented with sudden onset left sided numbness and found to have an acute subcentimeter right pontine infarct while on plavix. Initial work up was significant for negative CT head without contrast and CTA with high-grade stenosis of both ICA and distal vertebral arteries without signs of acute infarction. Due to resolution of symptoms on admission and low NIH score, TPA was not administered. Neurology was consulted. Further workup  included MRI with findings of the acute right pontine infarct, mild chronic small vessel ischemic changes, and an old pontine lacunar infarct. Echo with EF 60-65% and moderate LVH. Medication regimen at discharge included ASA 325 and Plavix 75mg  for 3 weeks and then monotherapy, Atorvastatin 80mg , and losartan-HCTZ. Choice for anticoagulation vs antiplatlet therapy was discussed at length and per neurology, opted to continue current ASA and plavix . Although patient still had some residual numbness along left side, she was improved and stable at discharge with recommended PT and OT home health with close follow-up with neurology.  Other [name] stay findings included a-fib noted on the echocardiogram. Patient has history of PAC's on prior [name] admission but no definitive atrial fibrillation. Cardiology was consulted. After extensive review of EKG's and telemetry strips by cardiology, no a-fib was identified. However she did demonstrate PAC's. TEE was recommended and was unremarkable. Patient was started on Meteprolol XL 50mg  and discharged with plans to follow-up with cardiology to do 30-day event monitor.   On admission patient's blood pressure was 187/71. She was allowed permissive hypertensive and slowly titrated up to home HTN medication with better control. At time of discharge, patient had stable blood pressures.  Issues for Follow Up:  1. Ensure patient has HHPT/OT 2. Patient would like referral for ophthalmology.  3. Ensure follow up with neurology stroke clinic in 4 weeks 4. Life-style modifications such as weight loss, diet and exercise for stroke risk reduction 5. Follow-up blood pressure  6. Recommend discussion with endocrinology concerning poorly controlled T1DM (Hgb A1c 9.8)  Significant Procedures:  CTA head and neck, CT without contrast, Echocardiogram, TEE, MRI brain w/out contrast  Significant Labs and Imaging:  Recent Labs  Lab 10/13/18 2131 10/13/18 2135 10/14/18 0439  10/15/18 0425  WBC 5.3  --  4.7 4.4  HGB 11.1* 11.9* 10.3* 10.6*  HCT 36.6 35.0* 33.3* 34.4*  PLT 231  --  228 236   Recent Labs  Lab 10/13/18 2131 10/13/18 2135 10/14/18 0439 10/15/18 0425  NA 136 140 141 139  K 3.5 3.7 3.7 3.6  CL 101 102 109 106  CO2 24  --  25 24  GLUCOSE 312* 310* 257* 207*  BUN 14 16 11 14   CREATININE 1.04* 0.90 0.85 0.87  CALCIUM 8.9  --  8.9 9.0  ALKPHOS 74  --   --   --   AST 22  --   --   --   ALT 22  --   --   --   ALBUMIN 3.1*  --   --   --     Ct Angio Head W Or Wo Contrast  Result Date: 10/14/2018 CLINICAL DATA:  67 year old female code stroke presentation yesterday with left side deficits. EXAM: CT ANGIOGRAPHY HEAD AND NECK TECHNIQUE: Multidetector CT imaging of the head and neck was performed using the standard protocol during bolus administration of intravenous contrast. Multiplanar CT image reconstructions and MIPs were obtained to evaluate the vascular anatomy. Carotid stenosis measurements (when applicable) are obtained utilizing NASCET criteria, using the distal internal carotid diameter as the denominator. CONTRAST:  28mL ISOVUE-370 IOPAMIDOL (ISOVUE-370) INJECTION 76% COMPARISON:  Head CT without contrast 10/13/2018. CTA head and neck 08/29/2015 and 08/30/2015. FINDINGS: CTA NECK Skeleton: No acute osseous abnormality identified. Visualized paranasal sinuses and mastoids are stable and well pneumatized. Largely absent dentition. Upper chest: Negative upper lungs. No superior mediastinal lymphadenopathy. Other neck: Surgically absent thyroid as before. Otherwise negative. Aortic arch: Mild Calcified aortic atherosclerosis. Bovine type arch configuration. Stable great vessel origins since 2016 with no origin stenosis. Right carotid system: Stable mild tortuosity of the proximal right CCA. Minimal right CCA and right carotid bifurcation plaque with no right carotid stenosis in the neck. Mildly tortuous cervical right ICA. Left carotid system: Mild  left CCA tortuosity. Chronic left CCA mostly calcified plaque just above the level of the thyroid with no stenosis, stable. Mild mostly calcified plaque at the left carotid bifurcation is stable with no stenosis. Vertebral arteries: No proximal right subclavian artery stenosis. Calcified plaque near the right vertebral artery origin, but no origin stenosis. Mildly tortuous right vertebral artery is patent to the skull base without stenosis. No proximal left subclavian artery stenosis despite soft and calcified plaque. Similar mild soft and calcified plaque near the left vertebral artery origin but no origin stenosis. Patent and occasionally tortuous left vertebral artery to the skull base without stenosis. CTA HEAD Posterior circulation: Moderate to severe bilateral V4 segment calcified plaque is re- demonstrated with associated moderate to severe bilateral V4 segment stenosis distal to both PICA origins which remain patent (series 7, image 137 on the left and image 132 on the right). The distal V4 segments then remain normal. Patent vertebrobasilar junction and basilar artery without stenosis. SCA and PCA origins are patent and within normal limits. Small left posterior communicating artery is present. The right is diminutive or absent. Bilateral PCA branches are within normal limits. Anterior circulation: Moderate to severe bilateral ICA siphon calcified atherosclerosis redemonstrated. Both ophthalmic artery origins remain patent. Hemodynamically significant stenosis on the right suspected at the junction of the vertical petrous and cavernous segments, with moderate right supraclinoid the stenosis. These segments appears stable. On the left side hemodynamically significant siphon stenosis is suspected in the proximal  and distal cavernous segment. The segments appears stable. Carotid termini remain patent. MCA and ACA origins are normal. Anterior communicating artery and bilateral ACA branches are stable and within  normal limits. Left MCA M1 segment, bifurcation, and left MCA branches are stable and within normal limits. Right MCA M1 segment, bifurcation, and right MCA branches are stable and within normal limits. Venous sinuses: Patent. Anatomic variants: Bovine type aortic arch configuration. Delayed phase: Gray-white matter differentiation in the right hemisphere peers stable and within normal limits. No acute or evolving infarct is identified. No intracranial mass effect, ventriculomegaly, or hemorrhage identified. No abnormal enhancement identified. Review of the MIP images confirms the above findings IMPRESSION: 1. 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 There is comparatively little extracranial atherosclerosis, and no plaque or stenosis identified in the circle of Willis branches. 2. Stable CT appearance of the brain since yesterday. No acute or evolving infarct identified. Electronically Signed   By: Genevie Ann M.D.   On: 10/14/2018 09:27   Dg Chest 2 View  Result Date: 10/14/2018 CLINICAL DATA:  Stroke x last night around 830pm, left sided numbness from neck down to her feet, HTN, diabetic, nonsmoker EXAM: CHEST - 2 VIEW COMPARISON:  04/03/2016 FINDINGS: Central pulmonary vascular congestion. No confluent airspace disease. Moderate cardiomegaly stable.  Aortic Atherosclerosis (ICD10-170.0). No effusion. Osteopenia with stable mild midthoracic compression deformity. IMPRESSION: Stable cardiomegaly and central pulmonary vascular Electronically Signed   By: Lucrezia Europe M.D.   On: 10/14/2018 08:45   Ct Angio Neck W Or Wo Contrast  Result Date: 10/14/2018 CLINICAL DATA:  67 year old female code stroke presentation yesterday with left side deficits. EXAM: CT ANGIOGRAPHY HEAD AND NECK TECHNIQUE: Multidetector CT imaging of the head and neck was performed using the standard  protocol during bolus administration of intravenous contrast. Multiplanar CT image reconstructions and MIPs were obtained to evaluate the vascular anatomy. Carotid stenosis measurements (when applicable) are obtained utilizing NASCET criteria, using the distal internal carotid diameter as the denominator. CONTRAST:  59mL ISOVUE-370 IOPAMIDOL (ISOVUE-370) INJECTION 76% COMPARISON:  Head CT without contrast 10/13/2018. CTA head and neck 08/29/2015 and 08/30/2015. FINDINGS: CTA NECK Skeleton: No acute osseous abnormality identified. Visualized paranasal sinuses and mastoids are stable and well pneumatized. Largely absent dentition. Upper chest: Negative upper lungs. No superior mediastinal lymphadenopathy. Other neck: Surgically absent thyroid as before. Otherwise negative. Aortic arch: Mild Calcified aortic atherosclerosis. Bovine type arch configuration. Stable great vessel origins since 2016 with no origin stenosis. Right carotid system: Stable mild tortuosity of the proximal right CCA. Minimal right CCA and right carotid bifurcation plaque with no right carotid stenosis in the neck. Mildly tortuous cervical right ICA. Left carotid system: Mild left CCA tortuosity. Chronic left CCA mostly calcified plaque just above the level of the thyroid with no stenosis, stable. Mild mostly calcified plaque at the left carotid bifurcation is stable with no stenosis. Vertebral arteries: No proximal right subclavian artery stenosis. Calcified plaque near the right vertebral artery origin, but no origin stenosis. Mildly tortuous right vertebral artery is patent to the skull base without stenosis. No proximal left subclavian artery stenosis despite soft and calcified plaque. Similar mild soft and calcified plaque near the left vertebral artery origin but no origin stenosis. Patent and occasionally tortuous left vertebral artery to the skull base without stenosis. CTA HEAD Posterior circulation: Moderate to severe bilateral V4  segment calcified plaque is re- demonstrated with associated moderate to severe bilateral V4 segment stenosis distal to both PICA origins which remain patent (series 7, image 137 on the left and image 132 on the right). The distal V4 segments then remain normal. Patent vertebrobasilar junction and basilar artery without stenosis. SCA and PCA origins are patent and within normal limits. Small left posterior communicating artery is present. The right is diminutive or absent. Bilateral PCA branches are within normal limits. Anterior circulation: Moderate to severe bilateral ICA siphon calcified atherosclerosis redemonstrated. Both ophthalmic artery origins remain patent. Hemodynamically significant stenosis on the right suspected at the junction of the vertical petrous and cavernous segments, with moderate right supraclinoid the stenosis. These segments appears stable. On the left side hemodynamically significant siphon stenosis is suspected in the proximal and distal cavernous segment. The segments appears stable. Carotid termini remain patent. MCA and ACA origins are normal. Anterior communicating artery and bilateral ACA branches are stable and within normal limits. Left MCA M1 segment, bifurcation, and left MCA branches are stable and within normal limits. Right MCA M1 segment, bifurcation, and right MCA branches are stable and within normal limits. Venous sinuses: Patent. Anatomic variants: Bovine type aortic arch configuration. Delayed phase: Gray-white matter differentiation in the right hemisphere peers stable and within normal limits. No acute or evolving infarct is identified. No intracranial mass effect, ventriculomegaly, or hemorrhage identified. No abnormal enhancement identified. Review of the MIP images confirms the above findings IMPRESSION: 1. 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 There is comparatively little extracranial atherosclerosis, and no plaque or stenosis identified in the circle of Willis branches. 2. Stable CT appearance of the brain since yesterday. No acute or evolving infarct identified. Electronically Signed   By: Genevie Ann M.D.   On: 10/14/2018 09:27   Mr Brain Wo Contrast  Result Date: 10/14/2018 CLINICAL DATA:  Follow up stroke. History of stroke, hyperlipidemia, hypertension, diabetes and thyroid cancer. EXAM: MRI HEAD WITHOUT CONTRAST TECHNIQUE: Multiplanar, multiecho pulse sequences of the brain and surrounding structures were obtained without intravenous contrast. COMPARISON:  CT HEAD October 14, 2018 and MRI head August 29, 2015 FINDINGS: Multiple sequences are moderately motion degraded. INTRACRANIAL CONTENTS: 6 mm acute RIGHT pontine infarct at superior cerebellar peduncle with low ADC values. A few scattered chronic microhemorrhages noted. Patchy supratentorial white matter T2 hyperintensities. Old pontine lacunar infarcts. Prominent basal ganglia perivascular spaces associated with chronic small vessel ischemic changes. No parenchymal brain volume loss for age. No midline shift, mass effect or masses. No abnormal extra-axial fluid collections. Basal cisterns are patent. VASCULAR: Normal major intracranial vascular flow voids present at skull base. SKULL AND UPPER CERVICAL SPINE: No abnormal sellar expansion. No suspicious calvarial bone marrow signal. Craniocervical junction maintained. SINUSES/ORBITS: The mastoid air-cells and included paranasal sinuses are well-aerated.The included ocular globes and orbital contents are non-suspicious. Status post bilateral ocular lens implants. OTHER: Patient is edentulous. IMPRESSION: 1. Motion degraded examination. Acute subcentimeter RIGHT pontine infarct. 2. 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 Electronically Signed   By: Elon Alas M.D.   On:  10/14/2018 20:29   Mm 3d Screen Breast Bilateral  Result Date: 10/04/2018 CLINICAL DATA:  Screening. EXAM: DIGITAL SCREENING BILATERAL MAMMOGRAM WITH TOMO AND CAD COMPARISON:  Previous exam(s). ACR Breast Density Category b: There are scattered areas of fibroglandular density. FINDINGS:  There are no findings suspicious for malignancy. Images were processed with CAD. IMPRESSION: No mammographic evidence of malignancy. A result letter of this screening mammogram will be mailed directly to the patient. RECOMMENDATION: Screening mammogram in one year. (Code:[id number]-B-01Y) BI-RADS CATEGORY  1: Negative. Electronically Signed   By: Lajean Manes M.D.   On: 10/04/2018 16:03   Ct Head Code Stroke Wo Contrast  Result Date: 10/13/2018 CLINICAL DATA:  Code stroke.  Left-sided weakness. EXAM: CT HEAD WITHOUT CONTRAST TECHNIQUE: Contiguous axial images were obtained from the base of the skull through the vertex without intravenous contrast. COMPARISON:  04/03/2016 FINDINGS: Brain: No evidence of acute infarction, hemorrhage, hydrocephalus, extra-axial collection or mass lesion/mass effect. 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 Mild, age congruent cerebral volume loss. Vascular: Atherosclerotic calcification.  No hyperdense vessel. Skull: Normal. Negative for fracture or focal lesion. Sinuses/Orbits: No acute finding. Other: These results were communicated to Dr. Leonel Ramsay at 9:41 pmon 10/20/2019by text page via the [name] messaging system. ASPECTS [facility] Stroke Program Early CT Score) - Ganglionic level infarction (caudate, lentiform nuclei, internal capsule, insula, M1-M3 cortex): 7 - Supraganglionic infarction (M4-M6 cortex): 3 Total score (0-10 with 10 being normal): 10 IMPRESSION: 1. No acute finding.  ASPECTS is 10. 2. 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 Electronically Signed   By: Monte Fantasia M.D.   On: 10/13/2018 21:43   Results/Tests Pending at Time of Discharge: None  Discharge Medications:   Allergies as of 10/16/2018   No Known Allergies     Medication List    TAKE these medications   aspirin 325 MG tablet Take 1 tablet (325 mg total) by mouth daily. Start taking on:  10/17/2018   atorvastatin 80 MG tablet Commonly known as:  LIPITOR Take 1 tablet (80 mg total) by mouth daily. Start taking on:  10/17/2018 What changed:    medication strength  See the new instructions.   BESIVANCE 0.6 % Susp Generic drug:  Besifloxacin HCl Place 1 drop into both eyes See admin instructions. 4 times daily for 2 days after eye injection   clopidogrel 75 MG tablet Commonly known as:  PLAVIX TAKE 1 TABLET BY MOUTH EVERY DAY What changed:  Another medication with the same name was added. Make sure you understand how and when to take each.   clopidogrel 75 MG tablet Commonly known as:  PLAVIX Take 1 tablet (75 mg total) by mouth daily. Start taking on:  10/17/2018 What changed:  You were already taking a medication with the same name, and this prescription was added. Make sure you understand how and when to take each.   CRANBERRY PO Take 1 tablet by mouth daily.   fexofenadine 30 MG tablet Commonly known as:  ALLEGRA Take 30 mg by mouth daily as needed (allergies).   insulin pump Soln Inject into the skin continuous. humalog insulin   levothyroxine 175 MCG tablet Commonly known as:  SYNTHROID, LEVOTHROID Take 175 mcg by mouth daily.   losartan-hydrochlorothiazide 100-12.5 MG tablet Commonly known as:  HYZAAR Take 1 tablet by mouth daily.   metoprolol succinate 50 MG 24 hr tablet Commonly known as:  TOPROL-XL Take 1 tablet (50 mg total) by mouth daily. Start taking on:  10/17/2018   multivitamin-lutein Caps capsule Take 1 capsule by mouth daily.   Potassium 99 MG Tabs Take 99 mg by mouth daily.   ranitidine 150 MG tablet Commonly known as:  ZANTAC TAKE 1 TABLET BY MOUTH TWICE DAILY  Durable Medical Equipment  (From admission, onward)          Start     Ordered   10/16/18 1217  For home use only DME 3 n 1  Once     10/16/18 1217          Discharge Instructions: Please refer to Patient Instructions section of EMR for full details.  Patient was counseled important signs and symptoms that should prompt return to medical care, changes in medications, dietary instructions, activity restrictions, and follow up appointments.   Follow-Up Appointments: Follow-up Information    Shirley, [name], DO. Go on 10/29/2018.   Specialty:  Family Medicine Why:  @ 1:55PM (please arrive at least 15 min early) Contact information: [address] [place] [address] [phone]        [facility] Follow up in 4 week(s).   Specialty:  Neurology Why:  stroke clinic. office will call with appt date and time.  Contact information: [place] [phone]       [place] Follow up.   Specialty:  Cardiology Why:  You will be called to arrange for a 30 day heart monitor and follow up with an electophysiology heart specialist.  Contact information: [address], [place], [place], DO 10/16/2018, 6:57 PM PGY-1, [place]

## 2018-10-15 NOTE — Progress Notes (Addendum)
Family Medicine Teaching Service Daily Progress Note Intern Pager: [phone]  Patient name: [name] Medical record number: [id number] Date of birth: Jul 11, 1951 Age: 67 y.o. Gender: female  Primary Care Provider: [name], [name], DO Consultants: Neurology Code Status: Full  Pt Overview and Major Events to Date:  Admitted 10/13/2018  Assessment and Plan: Keyaria Lawson Perkinsis a 67 y.o.femalepresenting with new stroke. PMH is significant forprevious CVA (2016), T1DM, HLD, Hypothyroidism, HTN, GERD.  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  Neuro exam this morning unchanged with decreased sensory on left with 5 out of 5 strength bilaterally. CN II-XII intact. Overnight vitals stable.   Patient on increased atorvastatin. Will also consider P2Y12 Platelet function testing to determine if patient is resistant to Plavix. Consider switch from Plavix to Brilinta. ECHO with Afib noted (not noted on tele). LVEF 60-65%, moderate. - Neurology following, appreciate recommendations - Consult cardiology regarding Afib on ECHO for possible loop given would change anticoag management. - A1C 9.8 - Restart losartan today to gradually normalize BP over 5-7 - Telemetry and continuous pulse ox - Continue ASA 325 + plavix 75mg  x 3 weeks then monotherapy - Increase Atorvastatin 40 to 80mg  QD - Follow up OT/SLP; HHPT  Atrial Fibrillation on ECHO  Irregular Heart Beat  PAC's Noted during previous hospitalization and described as sinus with PACs. Previouslyreferred toCardiology, per patient she has not seen them.  - continuous cardiac monitoring  - regarding Afib on ECHO for possible loop given would change anticoag management. - ECHO with ?atrial fib but not seen on tele thus far  - May need to start anticoagulation   Type 1 Diabetes Followed  byDr. Chalmers Cater with Endocrinology, patient with frequent visitsevery 6 months. Next appointment 10/2018. On insulin pump.Per chart review patient with poor control and elevated A1Cs. Pt has follow-up with [name] next month to discuss pump settings and targets. - continue insulin pump  - Follow-up A1C - monitor CBGs closely - Follow-up with endocrinologist outpatient for adjustments  Anemia: chronic Hgb 11.1>10.3. Baseline 11. MCV 92.4>89.8.  - Will continue to monitor.  HTN Currently allowing permissive HTN.184/71 on admission. BP overnight 130-187/60's. BP this AM 130/61. Takes losartan-HCTZ 100-12.5mg  dailyat home. Will gradually improve BP as 24 hour window ends. Consider adding BP medicine tomorrow pending overnight BP's. - hold home meds to allow permissive HTN - continue to monitor  HLD Lipids WNL, LDL 48. Takes atorvastatin 40mg  daily. Will increase Atorvastatin due to CVA history and risk reduction. - Increase Atorvastatin from 40mg  to 80mg  QD  GERD Home meds: zantac 150mg  bid - pepcid while inpatient as zantac not on formulary  H/o Thyroid Cancer: S/p thyroidectomy 2006. Currently on synthroid 175 mcg daily. TSH 2.694. - continue home meds  FEN/GI: Heart heathy/ Carb modified PPx: Lovenox  Disposition: pending neurology recs  Subjective:  Patient reports no complaints or concerns. She thinks her left arm may be slightly improved. She is willing to have [place] work with her.   Objective: Temp:  [97.5 F (36.4 C)-98.5 F (36.9 C)] 98.3 F (36.8 C) (10/22 0350) Pulse Rate:  [60-75] 60 (10/22 0350) Resp:  [17-20] 20 (10/22 0350) BP: (128-160)/(58-91) 130/61 (10/22 0350) SpO2:  [97 %-100 %] 100 % (10/22 0350) Physical Exam: General: NAD, pleasant Neck: Supple, no LAD Cardiovascular: RRR, no m/r/g Respiratory: CTA BL, normal work of breathing MSK: moves 4 extremities equally Derm: no  rashes appreciated Neuro: CN II-XII grossly intact, strength 5/5 in  BLUE Psych: AOx3, appropriate affect  Laboratory: Recent Labs  Lab 10/13/18 2131 10/13/18 2135 10/14/18 0439 10/15/18 0425  WBC 5.3  --  4.7 4.4  HGB 11.1* 11.9* 10.3* 10.6*  HCT 36.6 35.0* 33.3* 34.4*  PLT 231  --  228 236   Recent Labs  Lab 10/13/18 2131 10/13/18 2135 10/14/18 0439 10/15/18 0425  NA 136 140 141 139  K 3.5 3.7 3.7 3.6  CL 101 102 109 106  CO2 24  --  25 24  BUN 14 16 11 14   CREATININE 1.04* 0.90 0.85 0.87  CALCIUM 8.9  --  8.9 9.0  PROT 6.8  --   --   --   BILITOT 0.5  --   --   --   ALKPHOS 74  --   --   --   ALT 22  --   --   --   AST 22  --   --   --   GLUCOSE 312* 310* 257* 207*   TSH 2.694 A1c 9.8  Lipid Panel     Component Value Date/Time   CHOL 107 10/14/2018 0439   TRIG 53 10/14/2018 0439   HDL 48 10/14/2018 0439   CHOLHDL 2.2 10/14/2018 0439   VLDL 11 10/14/2018 0439   LDLCALC 48 10/14/2018 0439   LDLDIRECT 75 01/25/2012 1508   UDS negative  ECHO 10/14/2018: Study Conclusions - Procedure narrative: Transthoracic echocardiography. Technically difficult study. Intravenous contrast (Definity) was   administered. - Left ventricle: The cavity size was normal. There was moderate   concentric hypertrophy. Systolic function was normal. The   estimated ejection fraction was in the range of 60% to 65%. Wall   motion was normal; there were no regional wall motion   abnormalities. The study is not technically sufficient to allow   evaluation of LV diastolic function. - Left atrium: The atrium was normal in size. - Right atrium: The atrium was normal in size.  Impressions: - Technically difficult study. Afib is noted. LVEF 60-65%, moderate   LVH, normal wall motion, normal biatrial size.  Imaging/Diagnostic Tests:  Dg Chest 2 View Stable cardiomegaly and central pulmonary vascular   Ct Angio Neck W Or Wo Contrast 1. 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 There is comparatively little extracranial atherosclerosis, and no plaque or stenosis identified in the circle of Willis branches. 2. Stable CT appearance of the brain since yesterday. No acute or evolving infarct identified.  Mr Brain Wo Contrast 1. Motion degraded examination. Acute subcentimeter RIGHT pontine infarct. 2. 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   Ct Head Code Stroke Wo Contrast:  1. No acute finding.  ASPECTS is 10. 2. 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    Dewarren Ledbetter, [name], DO 10/15/2018, 6:46 AM PGY-2, [place] Intern pager: [phone], text pages welcome

## 2018-10-15 NOTE — Progress Notes (Signed)
Family Medicine Teaching Service Daily Progress Note Intern Pager: [phone]  Patient name: [name] Medical record number: [id number] Date of birth: 1951/03/07 Age: 67 y.o. Gender: female  Primary Care Provider: Shirley, [name], DO Consultants: Neurology, Cardiology Code Status: Full  Pt Overview and Major Events to Date:  Admitted 10/13/2018  Assessment and Plan: Chriselda Leppert Perkinsis a 67 y.o.femalepresenting with new stroke. PMH is significant forprevious CVA (2016), T1DM, HLD, Hypothyroidism, HTN, GERD.  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 Neuro exam this AM unchanged. Overnight vitals stable. Platelet function test abnormal although could be secondary to anemia. Cardiology consulted due to a-fib on echo (not noted on tele), recommend TEE to rule out clot. Echo significant for EF 60-65%. Per cards, PACs but no real evidence of a-fib. Thus, do not recommend anticoag. Continue ASA and plavix. Start metoprolol. Plan to do 30-day event recorder outpatient to monitor for a-fib.  - Neurology following, appreciate recommendations  - Follow-up Stroke Clinic OP in 4 weeks - Cardiology following, appreciate recs - Follow-up TEE - Increased Losartan from 25mg  to 50mg   - Begin Metoprolol per cards - Telemetry and continuous pulse ox - Continue ASA 325 + plavix 75mg  x 3 weeks then monotherapy - Continue Atorvastatin 80mg  QD - Follow up SLP - PT/OT consulted - recommend HHPT and HMOT with 3n1BC  Atrial Fibrillation on ECHO  Irregular Heart Beat  PAC's Noted during previous hospitalization and described as sinus with PACs. Cardiology reviewed, PAC's with no evidence of a-fib. Plan for 30 day event monitor upon discharge and follow-up with cards outpatient. - followed by cardiology, will follow up - continuous  cardiac monitoring  Type 1 Diabetes A1C 9.8. Followed byDr. Chalmers Cater with Endocrinology, patient with frequent visitsevery 6 months. Next appointment 10/2018 to discuss pump settings and target. On insulin pump.Per chart review patient with poor control and elevated A1Cs. Patient NPO for TEE today, will monitor BG closely. H/o diabetic coma per patient, sensitive to low BS <100. - continue insulin pump  - monitor CBGs closely - Follow-up with endocrinologist outpatient for adjustments  HTN Was allowing for permssive HTN, will begin to gradually control. BP overnight 137-149/65-68. BP this AM 149/68. Takes losartan-HCTZ 100-12.5mg  dailyat home.  - Increased Losartan from 25mg  to 50mg    - Will restart home med at discharge - continue to monitor - Recommend outpatient sleep study for possible pulmonary HTN found on echo  Anemia: chronic Hgb 11.1>10.3>10.6. Baseline 11. MCV 92.4>89.8.  - Will continue to monitor.  HLD Lipids WNL, LDL 48. Atorvastatin increased during admission from 40mg  to 80mg .  -Continue Atorvastatin 80mg  QD  GERD Home meds: zantac 150mg  bid - pepcid while inpatient as zantac not on formulary  H/o Thyroid Cancer: S/p thyroidectomy 2006. Currently on synthroid 175 mcg daily. TSH 2.694. - continue home meds  FEN/GI: Heart heathy/ Carb modified PPx: Lovenox  Disposition: pending neurology recs  Subjective:  Patient reports no complaints or concerns. She thinks her left arm may be slightly improved. She is willing to have [place] work with her.   Objective: Temp:  [97.9 F (36.6 C)-98.6 F (37 C)] 98.4 F (36.9 C) (10/23 0813) Pulse Rate:  [61-86] 70 (10/23 1100) Resp:  [16-20] 18 (10/23 0813) BP: (137-149)/(61-78) 147/78 (10/23 1100) SpO2:  [94 %-100 %] 98 % (10/23 0813)  Physical Exam:  Gen: NAD, alert, non-toxic, well-appearing, sitting comfortably in chair  Skin: Warm and dry. No obvious rashes, lesions, or trauma. HEENT: NCAT No conjunctival  pallor or injection. No scleral icterus or injection.  MMM.  CV: RRR.  <2s capillary refill bilaterally.  RP & DPs 2+ bilaterally. 1-2+ bilateral pitting edema. Resp: CTAB.  No wheezing, rales, abnormal lung sounds.  No increased WOB Abd: NTND on palpation to all 4 quadrants.  Positive bowel sounds. Psych: Cooperative with exam. Pleasant. Makes eye contact. Speech normal. Extremities: Moves all extremities spontaneously  Neuro: Strength 5/5 in BL upper and lower Extremeties   Laboratory: Recent Labs  Lab 10/13/18 2131 10/13/18 2135 10/14/18 0439 10/15/18 0425  WBC 5.3  --  4.7 4.4  HGB 11.1* 11.9* 10.3* 10.6*  HCT 36.6 35.0* 33.3* 34.4*  PLT 231  --  228 236   Recent Labs  Lab 10/13/18 2131 10/13/18 2135 10/14/18 0439 10/15/18 0425  NA 136 140 141 139  K 3.5 3.7 3.7 3.6  CL 101 102 109 106  CO2 24  --  25 24  BUN 14 16 11 14   CREATININE 1.04* 0.90 0.85 0.87  CALCIUM 8.9  --  8.9 9.0  PROT 6.8  --   --   --   BILITOT 0.5  --   --   --   ALKPHOS 74  --   --   --   ALT 22  --   --   --   AST 22  --   --   --   GLUCOSE 312* 310* 257* 207*   TSH 2.694 A1c 9.8  Lipid Panel     Component Value Date/Time   CHOL 107 10/14/2018 0439   TRIG 53 10/14/2018 0439   HDL 48 10/14/2018 0439   CHOLHDL 2.2 10/14/2018 0439   VLDL 11 10/14/2018 0439   LDLCALC 48 10/14/2018 0439   LDLDIRECT 75 01/25/2012 1508   UDS negative  ECHO 10/14/2018: Study Conclusions - Procedure narrative: Transthoracic echocardiography. Technically difficult study. Intravenous contrast (Definity) was   administered. - Left ventricle: The cavity size was normal. There was moderate   concentric hypertrophy. Systolic function was normal. The   estimated ejection fraction was in the range of 60% to 65%. Wall   motion was normal; there were no regional wall motion   abnormalities. The study is not technically sufficient to allow   evaluation of LV diastolic function. - Left atrium: The atrium was  normal in size. - Right atrium: The atrium was normal in size.  Impressions: - Technically difficult study. Afib is noted. LVEF 60-65%, moderate   LVH, normal wall motion, normal biatrial size.  Imaging/Diagnostic Tests:  Dg Chest 2 View Stable cardiomegaly and central pulmonary vascular   Ct Angio Neck W Or Wo Contrast 1. 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 There is comparatively little extracranial atherosclerosis, and no plaque or stenosis identified in the circle of Willis branches. 2. Stable CT appearance of the brain since yesterday. No acute or evolving infarct identified.  Mr Brain Wo Contrast 1. Motion degraded examination. Acute subcentimeter RIGHT pontine infarct. 2. 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   Ct Head Code Stroke Wo Contrast:  1. No acute finding.  ASPECTS is 10. 2. 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    Mina Marble Homer, DO 10/16/2018, 12:06 PM PGY-1, [place]  [place] Intern pager: [phone], text pages welcome

## 2018-10-15 NOTE — Evaluation (Signed)
Occupational Therapy Evaluation Patient Details Name: [name] MRN: [id number] DOB: [birth year] Today's Date: 10/15/2018    History of Present Illness Patient is a 67 y/o female presenting to the ED on 10/13/18 wiht primary complaints of L sided weakness. CT head showing no acute findings. MRI positive:Acute subcentimeter RIGHT pontine infarct. Marland Kitchen PMH significant for previous stroke, T1DM, HLD, Hypothyroidism, HTN, GERD.    Clinical Impression   PTA patient independent with ADLs and mobility using rollator.  Admitted for above and limited by below (see problem list). Currently requires close supervision for transfers, supervision for grooming standing at sink, setup for UB ADLs and min assist for LB ADLs (using AE, as she uses at home).  Noted L UE impaired coordination but functional, agreeable to assist during mobility and bathing seated in shower. Patient will benefit from continued OT services while admitted and after dc at [facility] level in order to optimize independence and return to PLOF.  Will continue to follow.     Follow Up Recommendations  Home health OT;Supervision - Intermittent    Equipment Recommendations  3 in 1 bedside commode    Recommendations for Other Services       Precautions / Restrictions Precautions Precautions: Fall Restrictions Weight Bearing Restrictions: No      Mobility Bed Mobility               General bed mobility comments: seated in recliner upon entry  Transfers Overall transfer level: Needs assistance Equipment used: Rolling walker (2 wheeled) Transfers: Sit to/from Stand Sit to Stand: Supervision         General transfer comment: for safety and balance    Balance Overall balance assessment: Mild deficits observed, not formally tested                                         ADL either performed or assessed with clinical judgement   ADL Overall ADL's : Needs assistance/impaired     Grooming:  Supervision/safety;Standing   Upper Body Bathing: Supervision/ safety;Set up;Sitting   Lower Body Bathing: Sit to/from stand;Minimal assistance Lower Body Bathing Details (indicate cue type and reason): decreased reach to B feet, educated on completing bathing seated for safety Upper Body Dressing : Supervision/safety;Set up;Sitting   Lower Body Dressing: Minimal assistance;Sit to/from stand;With adaptive equipment Lower Body Dressing Details (indicate cue type and reason): decreased reach to LEs, uses AE but continues to require min assist  Toilet Transfer: Supervision/safety;Ambulation;RW(simulated in room) Toilet Transfer Details (indicate cue type and reason): supervision for safety Toileting- Clothing Manipulation and Hygiene: Supervision/safety;Sit to/from stand       Functional mobility during ADLs: Supervision/safety;Rolling walker;Cueing for safety       Vision Baseline Vision/History: Wears glasses Wears Glasses: Reading only Patient Visual Report: No change from baseline Vision Assessment?: No apparent visual deficits     Perception     Praxis      Pertinent Vitals/Pain Pain Assessment: No/denies pain     Hand Dominance Right   Extremity/Trunk Assessment Upper Extremity Assessment Upper Extremity Assessment: LUE deficits/detail LUE Deficits / Details: grossly 4/5 MMT, dysmetric but functional LUE Sensation: decreased light touch;decreased proprioception LUE Coordination: decreased gross motor   Lower Extremity Assessment Lower Extremity Assessment: Defer to PT evaluation   Cervical / Trunk Assessment Cervical / Trunk Assessment: Normal   Communication Communication Communication: No difficulties   Cognition Arousal/Alertness: Awake/alert Behavior  During Therapy: WFL for tasks assessed/performed Overall Cognitive Status: Within Functional Limits for tasks assessed                                     General Comments       Exercises      Shoulder Instructions      Home Living Family/patient expects to be discharged to:: Private residence Living Arrangements: Other relatives(sister and granddaugther) Available Help at Discharge: Family;Available PRN/intermittently Type of Home: House Home Access: Stairs to enter [employer] of Steps: 1 Entrance Stairs-Rails: None Home Layout: Two level;Able to live on main level with bedroom/bathroom     Bathroom Shower/Tub: Occupational psychologist: Standard(3:1 over toilet)     Home Equipment: Walker - 4 wheels;Cane - single point;Bedside commode;Grab bars - tub/shower;Adaptive equipment Adaptive Equipment: Reacher;Sock aid        Prior Functioning/Environment Level of Independence: Independent with assistive device(s)        Comments: uses rollator for mobility, drives, independent ADLS/IADLs        OT Problem List: Decreased activity tolerance;Impaired balance (sitting and/or standing);Impaired vision/perception;Decreased coordination;Decreased cognition;Decreased safety awareness;Decreased knowledge of use of DME or AE;Decreased knowledge of precautions;Impaired sensation;Impaired UE functional use      OT Treatment/Interventions: Self-care/ADL training;Therapeutic exercise;Neuromuscular education;Energy conservation;DME and/or AE instruction;Therapeutic activities;Patient/family education;Balance training    OT Goals(Current goals can be found in the care plan section) Acute Rehab OT Goals Patient Stated Goal: return home OT Goal Formulation: With patient Time For Goal Achievement: 10/29/18 Potential to Achieve Goals: Good  OT Frequency: Min 3X/week   Barriers to D/C:            Co-evaluation              AM-PAC PT "6 Clicks" Daily Activity     Outcome Measure Help from another person eating meals?: None Help from another person taking care of personal grooming?: None Help from another person toileting, which includes using  toliet, bedpan, or urinal?: None Help from another person bathing (including washing, rinsing, drying)?: A Little Help from another person to put on and taking off regular upper body clothing?: None Help from another person to put on and taking off regular lower body clothing?: A Little 6 Click Score: 22   End of Session Equipment Utilized During Treatment: Gait belt;Rolling walker  Activity Tolerance: Patient tolerated treatment well Patient left: in chair;with call bell/phone within reach;with chair alarm set;with family/visitor present  OT Visit Diagnosis: Unsteadiness on feet (R26.81);Other symptoms and signs involving the nervous system (R29.898)                Time: 8850-2774 OT Time Calculation (min): 23 min Charges:  OT General Charges $OT Visit: 1 Visit OT Evaluation $OT Eval Moderate Complexity: [place], OT Acute Rehabilitation Services Pager [phone] Office [phone]   [name] 10/15/2018, 12:33 PM

## 2018-10-16 ENCOUNTER — Inpatient Hospital Stay (HOSPITAL_COMMUNITY): Payer: Medicare Other

## 2018-10-16 ENCOUNTER — Encounter (HOSPITAL_COMMUNITY): Payer: Self-pay

## 2018-10-16 ENCOUNTER — Other Ambulatory Visit: Payer: Self-pay | Admitting: Cardiology

## 2018-10-16 ENCOUNTER — Encounter: Payer: Self-pay | Admitting: Family Medicine

## 2018-10-16 ENCOUNTER — Encounter (HOSPITAL_COMMUNITY)
Admission: EM | Disposition: A | Discharge status: Home Health | Payer: Self-pay | Source: Home / Self Care | Attending: Family Medicine

## 2018-10-16 ENCOUNTER — Other Ambulatory Visit: Payer: Self-pay | Admitting: Family Medicine

## 2018-10-16 DIAGNOSIS — I639 Cerebral infarction, unspecified: Secondary | ICD-10-CM

## 2018-10-16 DIAGNOSIS — R9431 Abnormal electrocardiogram [ECG] [EKG]: Secondary | ICD-10-CM

## 2018-10-16 DIAGNOSIS — I6302 Cerebral infarction due to thrombosis of basilar artery: Secondary | ICD-10-CM

## 2018-10-16 HISTORY — PX: TEE WITHOUT CARDIOVERSION: SHX5443

## 2018-10-16 LAB — GLUCOSE, CAPILLARY
GLUCOSE-CAPILLARY: 191 mg/dL — AB (ref 70–99)
GLUCOSE-CAPILLARY: 242 mg/dL — AB (ref 70–99)
Glucose-Capillary: 292 mg/dL — ABNORMAL HIGH (ref 70–99)
Glucose-Capillary: 234 mg/dL — ABNORMAL HIGH (ref 70–99)
Glucose-Capillary: 185 mg/dL — ABNORMAL HIGH (ref 70–99)
Glucose-Capillary: 133 mg/dL — ABNORMAL HIGH (ref 70–99)

## 2018-10-16 SURGERY — ECHOCARDIOGRAM, TRANSESOPHAGEAL
Anesthesia: Moderate Sedation

## 2018-10-16 MED ORDER — BUTAMBEN-TETRACAINE-BENZOCAINE 2-2-14 % EX AERO
INHALATION_SPRAY | CUTANEOUS | Status: DC | PRN
  Administered 2018-10-16: 2 via TOPICAL

## 2018-10-16 MED ORDER — METOPROLOL SUCCINATE ER 25 MG PO TB24
50.0000 mg | ORAL_TABLET | Freq: Every day | ORAL | Status: DC
  Administered 2018-10-16: 50 mg via ORAL
  Filled 2018-10-16: qty 2

## 2018-10-16 MED ORDER — ATORVASTATIN CALCIUM 80 MG PO TABS: 80.0000 mg | ORAL_TABLET | Freq: Every day | ORAL | 0 refills | Status: DC

## 2018-10-16 MED ORDER — ASPIRIN 325 MG PO TABS: 325.0000 mg | ORAL_TABLET | Freq: Every day | ORAL | 0 refills | Status: DC

## 2018-10-16 MED ORDER — SODIUM CHLORIDE 0.9 % IV SOLN: INTRAVENOUS | Status: DC

## 2018-10-16 MED ORDER — CLOPIDOGREL BISULFATE 75 MG PO TABS: 75.0000 mg | ORAL_TABLET | Freq: Every day | ORAL | 0 refills | Status: DC

## 2018-10-16 MED ORDER — LOSARTAN POTASSIUM 50 MG PO TABS
50.0000 mg | ORAL_TABLET | Freq: Every day | ORAL | Status: DC
  Administered 2018-10-16: 50 mg via ORAL
  Filled 2018-10-16: qty 1

## 2018-10-16 MED ORDER — METOPROLOL SUCCINATE ER 50 MG PO TB24: 50.0000 mg | ORAL_TABLET | Freq: Every day | ORAL | 0 refills | Status: DC

## 2018-10-16 MED ORDER — FENTANYL CITRATE (PF) 100 MCG/2ML IJ SOLN
INTRAMUSCULAR | Status: AC
  Filled 2018-10-16: qty 2

## 2018-10-16 MED ORDER — MIDAZOLAM HCL 5 MG/ML IJ SOLN
INTRAMUSCULAR | Status: AC
  Filled 2018-10-16: qty 2

## 2018-10-16 MED ORDER — MIDAZOLAM HCL 10 MG/2ML IJ SOLN
INTRAMUSCULAR | Status: DC | PRN
  Administered 2018-10-16: 1 mg via INTRAVENOUS
  Administered 2018-10-16: 2 mg via INTRAVENOUS
  Administered 2018-10-16: 1 mg via INTRAVENOUS
  Administered 2018-10-16 (×2): 2 mg via INTRAVENOUS

## 2018-10-16 MED ORDER — FENTANYL CITRATE (PF) 100 MCG/2ML IJ SOLN
INTRAMUSCULAR | Status: DC | PRN
  Administered 2018-10-16 (×3): 25 ug via INTRAVENOUS

## 2018-10-16 NOTE — Progress Notes (Signed)
  Echocardiogram Echocardiogram Transesophageal has been performed.  [name] 10/16/2018, 5:28 PM

## 2018-10-16 NOTE — Interval H&P Note (Signed)
History and Physical Interval Note:  10/16/2018 2:51 PM  [name] Nhi Butrum  has presented today for surgery, with the diagnosis of STROKE  The various methods of treatment have been discussed with the patient and family. After consideration of risks, benefits and other options for treatment, the patient has consented to  Procedure(s): TRANSESOPHAGEAL ECHOCARDIOGRAM (TEE) (N/A) as a surgical intervention .  The patient's history has been reviewed, patient examined, no change in status, stable for surgery.  I have reviewed the patient's chart and labs.  Questions were answered to the patient's satisfaction.     Charlies Rayburn Harrell Gave

## 2018-10-16 NOTE — Discharge Instructions (Signed)
Dear [name],   Thank you for letting us participate in your care! In this section, you will find a brief hospital admission summary of why you were admitted to the hospital, what happened, and any further follow up we think would benefit you and your health:   You were admitted because you were experiencing sudden onset left sided numbness. You were found to have an acute stroke.   You had work-up to look for possible cause included multiple EKG's, an echocardiogram and TEE.  You were thought to have atrial fibrillation, but after cardiology evaluation this was found to not be the case.   Cardiology would like to discharge you with a 30-day event monitor to look more closely for possible atrial fibrillation. Neurology would like you to follow-up with them in a few weeks as well.   You were started on high dose aspirin and a new medication called Metoprolol to help control your heart rate. You will need to continue to take your home medications as prescribed.   Please see your PCP, Dr. Enid Derry, for follow-up appointment  POST-HOSPITAL/FOLLOW-UP CARE INSTRUCTIONS Home instructions:  1. Please continue your home medications as prescribed. Please begin taking high dose Aspirin 325mg  daily and Metoprolol 50mg  1 pill per day.  2. You will be provided a 30-day event monitor to check for A-fib. Please follow-up with cardiology and neurology 3. Please see Dr. Enid Derry on 10/29/18 for a follow up appointment.  Doctors appointments & follow up:  Future Appointments  Date Time Provider [place]  10/29/2018  1:55 PM Shirley, [name], DO [facility] [place]   Thank you for choosing [facility]! Take care and be well!  [facility]  [place], [place] [address] [phone]

## 2018-10-16 NOTE — Progress Notes (Signed)
OT Cancellation Note  Patient Details Name: [name] MRN: [id number] DOB: 16-Jul-1951   Cancelled Treatment:    Reason Eval/Treat Not Completed: Patient at procedure or test/ unavailable(Endo. Will return as schedule allows. )  [place], OTR/L Acute Rehab Pager: [phone] Office: [phone] 10/16/2018, 2:15 PM

## 2018-10-16 NOTE — Progress Notes (Addendum)
STROKE TEAM PROGRESS NOTE   INTERVAL HISTORY Up in chair. AF not confirmed by cardiology. For TEE today followed by 30 d monitor. If 30d monitor neg, will place loop at that time.  Vitals:   10/15/18 1942 10/15/18 2333 10/16/18 0344 10/16/18 0813  BP: (!) 141/73 (!) 142/70 (!) 149/68 (!) 149/78  Pulse: 72 70 61 86  Resp: 20 20 20 18   Temp: 98.4 F (36.9 C) 98.1 F (36.7 C) 97.9 F (36.6 C) 98.4 F (36.9 C)  TempSrc: Oral Oral Oral Oral  SpO2: 94% 100% 97% 98%  Weight:      Height:        CBC:  Recent Labs  Lab 10/13/18 2131  10/14/18 0439 10/15/18 0425  WBC 5.3  --  4.7 4.4  NEUTROABS 2.7  --   --  2.0  HGB 11.1*   < > 10.3* 10.6*  HCT 36.6   < > 33.3* 34.4*  MCV 92.4  --  89.8 90.8  PLT 231  --  228 236   < > = values in this interval not displayed.    Basic Metabolic Panel:  Recent Labs  Lab 10/14/18 0439 10/15/18 0425  NA 141 139  K 3.7 3.6  CL 109 106  CO2 25 24  GLUCOSE 257* 207*  BUN 11 14  CREATININE 0.85 0.87  CALCIUM 8.9 9.0   Lipid Panel:     Component Value Date/Time   CHOL 107 10/14/2018 0439   TRIG 53 10/14/2018 0439   HDL 48 10/14/2018 0439   CHOLHDL 2.2 10/14/2018 0439   VLDL 11 10/14/2018 0439   LDLCALC 48 10/14/2018 0439   HgbA1c:  Lab Results  Component Value Date   HGBA1C 9.8 (H) 10/14/2018   Urine Drug Screen:     Component Value Date/Time   LABOPIA NONE DETECTED 10/14/2018 0642   COCAINSCRNUR NONE DETECTED 10/14/2018 0642   LABBENZ NONE DETECTED 10/14/2018 0642   AMPHETMU NONE DETECTED 10/14/2018 0642   THCU NONE DETECTED 10/14/2018 0642   LABBARB NONE DETECTED 10/14/2018 0642    Alcohol Level No results found for: ETH  IMAGING Ct Angio Head W Or Wo Contrast  Result Date: 10/14/2018 CLINICAL DATA:  67 year old female code stroke presentation yesterday with left side deficits. EXAM: CT ANGIOGRAPHY HEAD AND NECK TECHNIQUE: Multidetector CT imaging of the head and neck was performed using the standard protocol during  bolus administration of intravenous contrast. Multiplanar CT image reconstructions and MIPs were obtained to evaluate the vascular anatomy. Carotid stenosis measurements (when applicable) are obtained utilizing NASCET criteria, using the distal internal carotid diameter as the denominator. CONTRAST:  35mL ISOVUE-370 IOPAMIDOL (ISOVUE-370) INJECTION 76% COMPARISON:  Head CT without contrast 10/13/2018. CTA head and neck 08/29/2015 and 08/30/2015. FINDINGS: CTA NECK Skeleton: No acute osseous abnormality identified. Visualized paranasal sinuses and mastoids are stable and well pneumatized. Largely absent dentition. Upper chest: Negative upper lungs. No superior mediastinal lymphadenopathy. Other neck: Surgically absent thyroid as before. Otherwise negative. Aortic arch: Mild Calcified aortic atherosclerosis. Bovine type arch configuration. Stable great vessel origins since 2016 with no origin stenosis. Right carotid system: Stable mild tortuosity of the proximal right CCA. Minimal right CCA and right carotid bifurcation plaque with no right carotid stenosis in the neck. Mildly tortuous cervical right ICA. Left carotid system: Mild left CCA tortuosity. Chronic left CCA mostly calcified plaque just above the level of the thyroid with no stenosis, stable. Mild mostly calcified plaque at the left carotid bifurcation is stable with no  stenosis. Vertebral arteries: No proximal right subclavian artery stenosis. Calcified plaque near the right vertebral artery origin, but no origin stenosis. Mildly tortuous right vertebral artery is patent to the skull base without stenosis. No proximal left subclavian artery stenosis despite soft and calcified plaque. Similar mild soft and calcified plaque near the left vertebral artery origin but no origin stenosis. Patent and occasionally tortuous left vertebral artery to the skull base without stenosis. CTA HEAD Posterior circulation: Moderate to severe bilateral V4 segment calcified  plaque is re- demonstrated with associated moderate to severe bilateral V4 segment stenosis distal to both PICA origins which remain patent (series 7, image 137 on the left and image 132 on the right). The distal V4 segments then remain normal. Patent vertebrobasilar junction and basilar artery without stenosis. SCA and PCA origins are patent and within normal limits. Small left posterior communicating artery is present. The right is diminutive or absent. Bilateral PCA branches are within normal limits. Anterior circulation: Moderate to severe bilateral ICA siphon calcified atherosclerosis redemonstrated. Both ophthalmic artery origins remain patent. Hemodynamically significant stenosis on the right suspected at the junction of the vertical petrous and cavernous segments, with moderate right supraclinoid the stenosis. These segments appears stable. On the left side hemodynamically significant siphon stenosis is suspected in the proximal and distal cavernous segment. The segments appears stable. Carotid termini remain patent. MCA and ACA origins are normal. Anterior communicating artery and bilateral ACA branches are stable and within normal limits. Left MCA M1 segment, bifurcation, and left MCA branches are stable and within normal limits. Right MCA M1 segment, bifurcation, and right MCA branches are stable and within normal limits. Venous sinuses: Patent. Anatomic variants: Bovine type aortic arch configuration. Delayed phase: Gray-white matter differentiation in the right hemisphere peers stable and within normal limits. No acute or evolving infarct is identified. No intracranial mass effect, ventriculomegaly, or hemorrhage identified. No abnormal enhancement identified. Review of the MIP images confirms the above findings IMPRESSION: 1. CTA of the head and neck is stable since 2016 and remarkable for High-grade stenosis of both ICA siphons and both distal vertebral arteries (V4) due to chronic bulky calcified  plaque. There is comparatively little extracranial atherosclerosis, and no plaque or stenosis identified in the circle of Willis branches. 2. Stable CT appearance of the brain since yesterday. No acute or evolving infarct identified. Electronically Signed   By: Genevie Ann M.D.   On: 10/14/2018 09:27   Dg Chest 2 View  Result Date: 10/14/2018 CLINICAL DATA:  Stroke x last night around 830pm, left sided numbness from neck down to her feet, HTN, diabetic, nonsmoker EXAM: CHEST - 2 VIEW COMPARISON:  04/03/2016 FINDINGS: Central pulmonary vascular congestion. No confluent airspace disease. Moderate cardiomegaly stable.  Aortic Atherosclerosis (ICD10-170.0). No effusion. Osteopenia with stable mild midthoracic compression deformity. IMPRESSION: Stable cardiomegaly and central pulmonary vascular Electronically Signed   By: Lucrezia Europe M.D.   On: 10/14/2018 08:45   Ct Angio Neck W Or Wo Contrast  Result Date: 10/14/2018 CLINICAL DATA:  67 year old female code stroke presentation yesterday with left side deficits. EXAM: CT ANGIOGRAPHY HEAD AND NECK TECHNIQUE: Multidetector CT imaging of the head and neck was performed using the standard protocol during bolus administration of intravenous contrast. Multiplanar CT image reconstructions and MIPs were obtained to evaluate the vascular anatomy. Carotid stenosis measurements (when applicable) are obtained utilizing NASCET criteria, using the distal internal carotid diameter as the denominator. CONTRAST:  67mL ISOVUE-370 IOPAMIDOL (ISOVUE-370) INJECTION 76% COMPARISON:  Head CT without  contrast 10/13/2018. CTA head and neck 08/29/2015 and 08/30/2015. FINDINGS: CTA NECK Skeleton: No acute osseous abnormality identified. Visualized paranasal sinuses and mastoids are stable and well pneumatized. Largely absent dentition. Upper chest: Negative upper lungs. No superior mediastinal lymphadenopathy. Other neck: Surgically absent thyroid as before. Otherwise negative. Aortic arch:  Mild Calcified aortic atherosclerosis. Bovine type arch configuration. Stable great vessel origins since 2016 with no origin stenosis. Right carotid system: Stable mild tortuosity of the proximal right CCA. Minimal right CCA and right carotid bifurcation plaque with no right carotid stenosis in the neck. Mildly tortuous cervical right ICA. Left carotid system: Mild left CCA tortuosity. Chronic left CCA mostly calcified plaque just above the level of the thyroid with no stenosis, stable. Mild mostly calcified plaque at the left carotid bifurcation is stable with no stenosis. Vertebral arteries: No proximal right subclavian artery stenosis. Calcified plaque near the right vertebral artery origin, but no origin stenosis. Mildly tortuous right vertebral artery is patent to the skull base without stenosis. No proximal left subclavian artery stenosis despite soft and calcified plaque. Similar mild soft and calcified plaque near the left vertebral artery origin but no origin stenosis. Patent and occasionally tortuous left vertebral artery to the skull base without stenosis. CTA HEAD Posterior circulation: Moderate to severe bilateral V4 segment calcified plaque is re- demonstrated with associated moderate to severe bilateral V4 segment stenosis distal to both PICA origins which remain patent (series 7, image 137 on the left and image 132 on the right). The distal V4 segments then remain normal. Patent vertebrobasilar junction and basilar artery without stenosis. SCA and PCA origins are patent and within normal limits. Small left posterior communicating artery is present. The right is diminutive or absent. Bilateral PCA branches are within normal limits. Anterior circulation: Moderate to severe bilateral ICA siphon calcified atherosclerosis redemonstrated. Both ophthalmic artery origins remain patent. Hemodynamically significant stenosis on the right suspected at the junction of the vertical petrous and cavernous segments,  with moderate right supraclinoid the stenosis. These segments appears stable. On the left side hemodynamically significant siphon stenosis is suspected in the proximal and distal cavernous segment. The segments appears stable. Carotid termini remain patent. MCA and ACA origins are normal. Anterior communicating artery and bilateral ACA branches are stable and within normal limits. Left MCA M1 segment, bifurcation, and left MCA branches are stable and within normal limits. Right MCA M1 segment, bifurcation, and right MCA branches are stable and within normal limits. Venous sinuses: Patent. Anatomic variants: Bovine type aortic arch configuration. Delayed phase: Gray-white matter differentiation in the right hemisphere peers stable and within normal limits. No acute or evolving infarct is identified. No intracranial mass effect, ventriculomegaly, or hemorrhage identified. No abnormal enhancement identified. Review of the MIP images confirms the above findings IMPRESSION: 1. 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 There is comparatively little extracranial atherosclerosis, and no plaque or stenosis identified in the circle of Willis branches. 2. Stable CT appearance of the brain since yesterday. No acute or evolving infarct identified. Electronically Signed   By: Genevie Ann M.D.   On: 10/14/2018 09:27   Mr Brain Wo Contrast  Result Date: 10/14/2018 CLINICAL DATA:  Follow up stroke. History of stroke, hyperlipidemia, hypertension, diabetes and thyroid cancer. EXAM: MRI HEAD WITHOUT CONTRAST TECHNIQUE: Multiplanar, multiecho pulse sequences of the brain and surrounding structures were obtained without intravenous  contrast. COMPARISON:  CT HEAD October 14, 2018 and MRI head August 29, 2015 FINDINGS: Multiple sequences are moderately motion degraded. INTRACRANIAL CONTENTS: 6 mm  acute RIGHT pontine infarct at superior cerebellar peduncle with low ADC values. A few scattered chronic microhemorrhages noted. Patchy supratentorial white matter T2 hyperintensities. Old pontine lacunar infarcts. Prominent basal ganglia perivascular spaces associated with chronic small vessel ischemic changes. No parenchymal brain volume loss for age. No midline shift, mass effect or masses. No abnormal extra-axial fluid collections. Basal cisterns are patent. VASCULAR: Normal major intracranial vascular flow voids present at skull base. SKULL AND UPPER CERVICAL SPINE: No abnormal sellar expansion. No suspicious calvarial bone marrow signal. Craniocervical junction maintained. SINUSES/ORBITS: The mastoid air-cells and included paranasal sinuses are well-aerated.The included ocular globes and orbital contents are non-suspicious. Status post bilateral ocular lens implants. OTHER: Patient is edentulous. IMPRESSION: 1. Motion degraded examination. Acute subcentimeter RIGHT pontine infarct. 2. 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 Electronically Signed   By: Elon Alas M.D.   On: 10/14/2018 20:29   2D Echocardiogram  - Procedure narrative: Transthoracic echocardiography. Technically difficult study. Intravenous contrast (Definity) was administered. - Left ventricle: The cavity size was normal. There was moderate concentric hypertrophy. Systolic function was normal. The estimated ejection fraction was in the range of 60% to 65%. Wall motion was normal; there were no regional wall motion abnormalities. The study is not technically sufficient to allow evaluation of LV diastolic function. - Left atrium: The atrium was normal in size. - Right atrium: The atrium was normal in size. Impressions:  Technically difficult study. Afib is noted. LVEF 60-65%, moderate LVH, normal wall motion, normal biatrial size.  TEE pending   PHYSICAL EXAM Constitutional: Appears  well-developed and well-nourished.  Psych: Affect appropriate to situation Eyes: No scleral injection HENT: No OP obstrucion Head: Normocephalic.  Cardiovascular: Normal rate and regular rhythm.  Respiratory: Effort normal, non-labored breathing Skin: WDI  Neuro: Mental Status: Patient is awake, alert, oriented to person, place, month, year, and situation. Patient is able to give a clear and coherent history. No signs of aphasia or neglect Cranial Nerves: II: Visual Fields are full. Pupils are equal, round, and reactive to light.   III,IV, VI: EOMI without ptosis or diploplia.  V: Facial sensation is symmetric to temperature VII: Facial movement is symmetric. Decreased sensation L ear VIII: hearing is intact to voice X: Uvula elevates symmetrically XI: Shoulder shrug is symmetric. XII: tongue is midline without atrophy or fasciculations.  Motor: Tone is normal. Bulk is normal. 5/5 strength was present in bilateral arm and the right leg, she has 4+/5 strength in the left leg.  Sensory: Sensation is diminished in the left leg only. no extinction/neglect Cerebellar: FNF intact bilaterally  ASSESSMENT/PLAN [name] is a 67 y.o. female with history of HTN, HLD, prior stroke, CHF, type I DB, thyroid cancer presenting with L hemiparesis and L sided numbness.   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   Code Stroke CT head No acute stroke. Old L pontine lacune. ASPECTS 10.     CTA head & neck stable since 2016. High grade stenosis B ICA siphons and B distal VA V4.  MRI  R pontine infarct. Small vessel disease. Old pontine lacunar infarcts  2D Echo  EF 60-65%. Pt in AF. No embolus seen  TEE to look for embolic source. Arranged with [facility] for today at 3p. If PFO found, check  bilateral lower extremity venous dopplers to rule out DVT as possible source of stroke.   If TEE negative, a [facility] electrophysiologist will place 30d loop and consider placement of an implantable loop recorder to evaluate for atrial fibrillation as etiology of stroke following 30 d monitor if neg for AF. This has been explained to patient/family by Dr. Marlou Porch.  LDL 48  HgbA1c 9.8  HIV neg  Lovenox 40 mg sq daily for VTE prophylaxis  clopidogrel 75 mg daily prior to admission, now on aspirin 325 mg daily and clopidogrel 75 mg daily. Given no formal AF dx, continue DAPT x 3 weeks then one alone. If AF found, DOAC recommended.  Therapy recommendations:  HH PT, HHOT  Disposition:  Return home  Follow up with neurology stroke clinic in 4 weeks. Order placed. Added to d/c instructions.  Arrhythmia   No hx known atrial fibrillation  AF reported during 2D  Pt with possible AF per RN but not confirmed by cardiology.   EP consulted - for 30 d monitor. Loop following if neg for AF  Stroke recommends anticoagulation w/ DOAC for secondary stroke prevention if AF confirmed, otherwise, continue DAPT x 3 weeks then one alone    Hypertension  Stable . Permissive hypertension (OK if < 220/120) but gradually normalize in 5-7 days . Long-term BP goal normotensive  Hyperlipidemia  Home meds:  lipitor 40, resumed in hospital  LDL 48, goal < 70  Continue statin at discharge  Diabetes type I  Glucoses elevated  Has insulin pump  Working hard at home to get under control per pt  HgbA1c 9.8, goal < 7.0  Uncontrolled  Other Stroke Risk Factors  Advanced age  Morbid Obesity, Body mass index is 43.44 kg/m., recommend weight loss, diet and exercise as appropriate   Hx stroke/TIA  08/2015 - pontine infarct, started on DAPT to plavix  Family hx stroke (son)  Hx Congestive heart failure  Other Active Problems  Hx thyroid cancer, s/p OR 2006.   Hypothyroidism On synthroid. TSH normal.  GERD  Uterine fibroids s/p hysterectomy  Hx PACs, referred to cardiology, but has not  seen them  Hospital day # [place], MSN, APRN, ANVP-BC, AGPCNP-BC Advanced Practice Stroke Nurse [place] for Schedule & Pager information 10/16/2018 8:29 AM   ATTENDING NOTE: I reviewed above note and agree with the assessment and plan. Pt was seen and examined.   TEE performed today, unremarkable, no thrombus or PFO.  EP consulted and felt patient has high likelihood of A. fib on 30-day monitoring, will recommend 30-day cardio event monitor as outpatient to rule out A. fib.  If 30-day cardio event monitoring negative, then loop recorder will be considered.  Continue DAPT for 3 weeks and then either aspirin or Plavix alone.  Continue statin.  Patient educated on better diabetes control and stroke risk factor modification.  Neurology will sign off. Please call with questions. Pt will follow up with stroke clinic NP at [facility] in about 4 weeks. Thanks for the consult.   Rosalin Hawking, MD PhD Stroke Neurology 10/16/2018 5:37 PM     To contact Stroke Continuity provider, please refer to [email or web address] After hours, contact General Neurology

## 2018-10-16 NOTE — Progress Notes (Signed)
Progress Note  Patient Name: [name] Date of Encounter: 10/16/2018  Primary Cardiologist: Candee Furbish, MD   Subjective   She feel well today, denies SOB, no palpitations or dizziness.   Inpatient Medications    Scheduled Meds: . aspirin  300 mg Rectal Daily   Or  . aspirin  325 mg Oral Daily  . atorvastatin  80 mg Oral Daily  . clopidogrel  75 mg Oral Daily  . enoxaparin (LOVENOX) injection  40 mg Subcutaneous Q24H  . famotidine  10 mg Oral Daily  . insulin pump   Subcutaneous TID AC, HS, 0200  . levothyroxine  175 mcg Oral QAC breakfast  . losartan  50 mg Oral Daily  . metoprolol succinate  50 mg Oral Daily   Continuous Infusions: . sodium chloride Stopped (10/14/18 0645)   PRN Meds: acetaminophen **OR** acetaminophen (TYLENOL) oral liquid 160 mg/5 mL **OR** acetaminophen, senna-docusate   Vital Signs    Vitals:   10/16/18 0344 10/16/18 0813 10/16/18 1100 10/16/18 1206  BP: (!) 149/68 (!) 149/78 (!) 147/78 (!) 155/70  Pulse: 61 86 70 61  Resp: 20 18  18   Temp: 97.9 F (36.6 C) 98.4 F (36.9 C)  97.6 F (36.4 C)  TempSrc: Oral Oral  Oral  SpO2: 97% 98%  100%  Weight:      Height:        Intake/Output Summary (Last 24 hours) at 10/16/2018 1414 Last data filed at 10/16/2018 0900 Gross per 24 hour  Intake 240 ml  Output -  Net 240 ml   Filed Weights   10/13/18 2154 10/13/18 2326  Weight: 127.2 kg 125.8 kg    Telemetry    SR, very frequent PACs, sone PVCs - Personally Reviewed  Physical Exam   GEN: No acute distress.   Neck: No JVD Cardiac: RRR, no murmurs, rubs, or gallops.  Respiratory: Clear to auscultation bilaterally. GI: Soft, nontender, non-distended  MS: No edema; No deformity. Neuro:  Nonfocal  Psych: Normal affect   Labs    Chemistry Recent Labs  Lab 10/13/18 2131 10/13/18 2135 10/14/18 0439 10/15/18 0425  NA 136 140 141 139  K 3.5 3.7 3.7 3.6  CL 101 102 109 106  CO2 24  --  25 24  GLUCOSE 312* 310*  257* 207*  BUN 14 16 11 14   CREATININE 1.04* 0.90 0.85 0.87  CALCIUM 8.9  --  8.9 9.0  PROT 6.8  --   --   --   ALBUMIN 3.1*  --   --   --   AST 22  --   --   --   ALT 22  --   --   --   ALKPHOS 74  --   --   --   BILITOT 0.5  --   --   --   GFRNONAA 54*  --  >60 >60  GFRAA >60  --  >60 >60  ANIONGAP 11  --  7 9    Hematology Recent Labs  Lab 10/13/18 2131 10/13/18 2135 10/14/18 0439 10/15/18 0425  WBC 5.3  --  4.7 4.4  RBC 3.96  --  3.71* 3.79*  HGB 11.1* 11.9* 10.3* 10.6*  HCT 36.6 35.0* 33.3* 34.4*  MCV 92.4  --  89.8 90.8  MCH 28.0  --  27.8 28.0  MCHC 30.3  --  30.9 30.8  RDW 12.1  --  12.1 12.3  PLT 231  --  228 236  Cardiac EnzymesNo results for input(s): TROPONINI in the last 168 hours.  Recent Labs  Lab 10/13/18 2134  TROPIPOC 0.01    BNPNo results for input(s): BNP, PROBNP in the last 168 hours.   DDimer No results for input(s): DDIMER in the last 168 hours.   Radiology    Mr Brain Wo Contrast  Result Date: 10/14/2018 CLINICAL DATA:  Follow up stroke. History of stroke, hyperlipidemia, hypertension, diabetes and thyroid cancer. EXAM: MRI HEAD WITHOUT CONTRAST TECHNIQUE: Multiplanar, multiecho pulse sequences of the brain and surrounding structures were obtained without intravenous contrast. COMPARISON:  CT HEAD October 14, 2018 and MRI head August 29, 2015 FINDINGS: Multiple sequences are moderately motion degraded. INTRACRANIAL CONTENTS: 6 mm acute RIGHT pontine infarct at superior cerebellar peduncle with low ADC values. A few scattered chronic microhemorrhages noted. Patchy supratentorial white matter T2 hyperintensities. Old pontine lacunar infarcts. Prominent basal ganglia perivascular spaces associated with chronic small vessel ischemic changes. No parenchymal brain volume loss for age. No midline shift, mass effect or masses. No abnormal extra-axial fluid collections. Basal cisterns are patent. VASCULAR: Normal major intracranial vascular flow voids  present at skull base. SKULL AND UPPER CERVICAL SPINE: No abnormal sellar expansion. No suspicious calvarial bone marrow signal. Craniocervical junction maintained. SINUSES/ORBITS: The mastoid air-cells and included paranasal sinuses are well-aerated.The included ocular globes and orbital contents are non-suspicious. Status post bilateral ocular lens implants. OTHER: Patient is edentulous. IMPRESSION: 1. Motion degraded examination. Acute subcentimeter RIGHT pontine infarct. 2. 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 Electronically Signed   By: Elon Alas M.D.   On: 10/14/2018 20:29   Cardiac Studies    Patient Profile     67 y.o. female   Assessment & Plan    Recurrent ischemic stroke, Frequent PACs and short runs of SVT however no definite diagnosis of atrial fibrillation, however atrial fibrillation highly likely  TEE showed no left atrial thrombus, normal LVEF, EP thinks that she has high probability of having atrial fibrillation, we will arrange for an outpatient 30 day event monitor and EP follow up.   She can be discharge home.   Continue aspirin and Plavix for now.For questions or updates, please contact [facility] Please consult [email or web address] for contact info under     Signed, Ena Dawley, MD  10/16/2018, 2:14 PM

## 2018-10-16 NOTE — Consult Note (Addendum)
Cardiology Consultation:   Patient ID: [name] MRN: [id number]; DOB: [birth year]  Admit date: 10/13/2018 Date of Consult: 10/16/2018  Primary Care Provider: Shirley, [name], DO Primary Cardiologist: Candee Furbish, MD  Primary Electrophysiologist:  None    Patient Profile:   [name] is a 67 y.o. female with a hx of HTN, DM (type I), HLD, thyroid cancer s/p thyroidectomy, prior CVA 2016, GERD who is being seen today for the evaluation of recurrent stroke, possible AF at the request of Dr. Meda Coffee.  History of Present Illness:   [name] was admitted to [facility] with c/o L sided weakness, particularly/mostly her L leg, diagnosed with acute stroke.    Historically she was referred to cardiology, Dr. Marlou Porch, in 2017 who presented for evaluation of CHF. Per note, no evidence of CHF but was felt to have secondary pulmonary hypertension due to her morbid obesity. She had a R/LHC in 2013 which confirmed elevated pulmonary pressures and normal coronaries. She was recommended to undergo healthy lifestyle and dietary modifications and follow-up as needed  She carries no arrhythmia history  The patient was noted on telemetry this admission to have significant atrial ectopy and only moments/seconds of questionable AF. EP was asked to evaluate for possible loop implant.  The patient denies any cardiac awareness at all.  No CP, palpitations, denies DOE/SOB, no hx of near syncope or syncope.  Past Medical History:  Diagnosis Date  . Anemia   . Arthritis   . CHF (congestive heart failure) ([place])   . Depression   . GERD (gastroesophageal reflux disease)   . Hemorrhoids   . Hyperlipidemia   . Hypertension   . Hypothyroidism   . Seasonal allergies   . Stroke ([place])   . Thyroid cancer ([place])    per pt S/p Total Thyroidectomy with Radioactive Iodine Therapy  . Type 1 diabetes mellitus ([place])     Past Surgical History:  Procedure Laterality Date  . ABDOMINAL ANGIOGRAM  N/A 05/14/2012   Procedure: ABDOMINAL ANGIOGRAM;  Surgeon: Laverda Page, MD;  Location: [facility] CATH LAB;  Service: Cardiovascular;  Laterality: N/A;  . ABDOMINAL HYSTERECTOMY  1995   partial  . ANKLE FRACTURE SURGERY Right   . CARDIAC CATHETERIZATION     with coronary angiogram  . COLONOSCOPY N/A 09/09/2014   Procedure: COLONOSCOPY;  Surgeon: Gatha Mayer, MD;  Location: [facility];  Service: Endoscopy;  Laterality: N/A;  . LEFT AND RIGHT HEART CATHETERIZATION WITH CORONARY ANGIOGRAM N/A 05/14/2012   Procedure: LEFT AND RIGHT HEART CATHETERIZATION WITH CORONARY ANGIOGRAM;  Surgeon: Laverda Page, MD;  Location: [facility] CATH LAB;  Service: Cardiovascular;  Laterality: N/A;  . MINI METER   09/08/2014   ELECTRONIC INSULIN PUMP  . THYROIDECTOMY  2006     Home Medications:  Prior to Admission medications   Medication Sig Start Date End Date Taking? Authorizing Provider  atorvastatin (LIPITOR) 40 MG tablet TAKE 1 TABLET(40 MG) BY MOUTH DAILY Patient taking differently: Take 40 mg by mouth daily at 6 PM.  09/30/18  Yes Enid Derry, [name], DO  Besifloxacin HCl (BESIVANCE) 0.6 % SUSP Place 1 drop into both eyes See admin instructions. 4 times daily for 2 days after eye injection   Yes [provider]  clopidogrel (PLAVIX) 75 MG tablet TAKE 1 TABLET BY MOUTH EVERY DAY Patient taking differently: Take 75 mg by mouth daily.  07/15/18  Yes Enid Derry, [name], DO  CRANBERRY PO Take 1 tablet by mouth daily.   Yes [provider]  fexofenadine (ALLEGRA) 30 MG tablet Take 30 mg by mouth daily as needed (allergies).    Yes [provider]  Insulin Human (INSULIN PUMP) SOLN Inject into the skin continuous. humalog insulin   Yes [provider]  levothyroxine (SYNTHROID, LEVOTHROID) 175 MCG tablet Take 175 mcg by mouth daily. 06/06/17  Yes [provider]  losartan-hydrochlorothiazide (HYZAAR) 100-12.5 MG tablet Take 1 tablet by mouth daily. 01/30/18  Yes Guadalupe Dawn, MD   multivitamin-lutein [facility]) CAPS capsule Take 1 capsule by mouth daily.   Yes [provider]  Potassium 99 MG TABS Take 99 mg by mouth daily.   Yes [provider]  ranitidine (ZANTAC) 150 MG tablet TAKE 1 TABLET BY MOUTH TWICE DAILY Patient taking differently: Take 150 mg by mouth 2 (two) times daily.  09/23/18  Yes Shirley, [name], DO    Inpatient Medications: Scheduled Meds: . aspirin  300 mg Rectal Daily   Or  . aspirin  325 mg Oral Daily  . atorvastatin  80 mg Oral Daily  . clopidogrel  75 mg Oral Daily  . enoxaparin (LOVENOX) injection  40 mg Subcutaneous Q24H  . famotidine  10 mg Oral Daily  . insulin pump   Subcutaneous TID AC, HS, 0200  . levothyroxine  175 mcg Oral QAC breakfast  . losartan  50 mg Oral Daily   Continuous Infusions: . sodium chloride Stopped (10/14/18 0645)   PRN Meds: acetaminophen **OR** acetaminophen (TYLENOL) oral liquid 160 mg/5 mL **OR** acetaminophen, senna-docusate  Allergies:   No Known Allergies  Social History:   Social History   Socioeconomic History  . Marital status: Divorced    Spouse name: Not on file  . Number of children: 1  . Years of education: Not on file  . Highest education level: Not on file  Occupational History  . Occupation: retired Tour manager  . Financial resource strain: Not on file  . Food insecurity:    Worry: Not on file    Inability: Not on file  . Transportation needs:    Medical: Not on file    Non-medical: Not on file  Tobacco Use  . Smoking status: Never Smoker  . Smokeless tobacco: Never Used  Substance and Sexual Activity  . Alcohol use: No  . Drug use: No  . Sexual activity: Not on file  Lifestyle  . Physical activity:    Days per week: Not on file    Minutes per session: Not on file  . Stress: Not on file  Relationships  . Social connections:    Talks on phone: Not on file    Gets together: Not on file    Attends religious service: Not on file     Active member of club or organization: Not on file    Attends meetings of clubs or organizations: Not on file    Relationship status: Not on file  . Intimate partner violence:    Fear of current or ex partner: Not on file    Emotionally abused: Not on file    Physically abused: Not on file    Forced sexual activity: Not on file  Other Topics Concern  . Not on file  Social History Narrative   Retired Pharmacist, hospital, high school teacher taught mass. One son, helping to care for her granddaughter. No caffeine. Updated 07/20/2014.    Family History:   Family History  Problem Relation Age of Onset  . Heart disease Mother  MI  . Hyperlipidemia Mother   . Hypertension Mother   . Renal Disease Mother        insufficiency  . Early death Father        Head Injury at Work  . Hyperlipidemia Father   . Hypertension Father   . Stroke Son   . Diabetes Maternal Aunt   . Prostate cancer Paternal Uncle   . Heart disease Maternal Aunt   . Breast cancer Neg Hx      ROS:  Please see the history of present illness.  All other ROS reviewed and negative.     Physical Exam/Data:   Vitals:   10/15/18 1942 10/15/18 2333 10/16/18 0344 10/16/18 0813  BP: (!) 141/73 (!) 142/70 (!) 149/68 (!) 149/78  Pulse: 72 70 61 86  Resp: 20 20 20 18   Temp: 98.4 F (36.9 C) 98.1 F (36.7 C) 97.9 F (36.6 C) 98.4 F (36.9 C)  TempSrc: Oral Oral Oral Oral  SpO2: 94% 100% 97% 98%  Weight:      Height:        Intake/Output Summary (Last 24 hours) at 10/16/2018 0924 Last data filed at 10/15/2018 1853 Gross per 24 hour  Intake 240 ml  Output -  Net 240 ml   Filed Weights   10/13/18 2154 10/13/18 2326  Weight: 127.2 kg 125.8 kg   Body mass index is 43.44 kg/m.  General:  Well nourished, well developed, in no acute distress HEENT: normal Lymph: no adenopathy Neck: no JVD Endocrine:  No thryomegaly Vascular: No carotid bruits Cardiac:  RRR; extrasystoles, no murmurs, gallops or rubs Lungs:  CTA  b/l, no wheezing, rhonchi or rales  Abd: soft, non-tender, obese  Ext: 1+ edema Musculoskeletal:  No deformities Skin: warm and dry  Neuro:  no gross focal abnormalities noted, pt verbally reports some numbness and weakness remains LLE Psych:  Normal affect   EKG:  The EKG was personally reviewed and demonstrates:   SR 73bpm,  Telemetry:  Telemetry was personally reviewed and demonstrates:  SR, very frequent PACs  Relevant CV Studies:  10/14/18: TTE Study Conclusions - Procedure narrative: Transthoracic echocardiography. Technically   difficult study. Intravenous contrast (Definity) was   administered. - Left ventricle: The cavity size was normal. There was moderate   concentric hypertrophy. Systolic function was normal. The   estimated ejection fraction was in the range of 60% to 65%. Wall   motion was normal; there were no regional wall motion   abnormalities. The study is not technically sufficient to allow   evaluation of LV diastolic function. - Left atrium: The atrium was normal in size. - Right atrium: The atrium was normal in size. Impressions: - Technically difficult study. Afib is noted. LVEF 60-65%, moderate   LVH, normal wall motion, normal biatrial size.  Laboratory Data:  Chemistry Recent Labs  Lab 10/13/18 2131 10/13/18 2135 10/14/18 0439 10/15/18 0425  NA 136 140 141 139  K 3.5 3.7 3.7 3.6  CL 101 102 109 106  CO2 24  --  25 24  GLUCOSE 312* 310* 257* 207*  BUN 14 16 11 14   CREATININE 1.04* 0.90 0.85 0.87  CALCIUM 8.9  --  8.9 9.0  GFRNONAA 54*  --  >60 >60  GFRAA >60  --  >60 >60  ANIONGAP 11  --  7 9    Recent Labs  Lab 10/13/18 2131  PROT 6.8  ALBUMIN 3.1*  AST 22  ALT 22  ALKPHOS 74  BILITOT  0.5   Hematology Recent Labs  Lab 10/13/18 2131 10/13/18 2135 10/14/18 0439 10/15/18 0425  WBC 5.3  --  4.7 4.4  RBC 3.96  --  3.71* 3.79*  HGB 11.1* 11.9* 10.3* 10.6*  HCT 36.6 35.0* 33.3* 34.4*  MCV 92.4  --  89.8 90.8  MCH 28.0  --   27.8 28.0  MCHC 30.3  --  30.9 30.8  RDW 12.1  --  12.1 12.3  PLT 231  --  228 236   Cardiac EnzymesNo results for input(s): TROPONINI in the last 168 hours.  Recent Labs  Lab 10/13/18 2134  TROPIPOC 0.01    BNPNo results for input(s): BNP, PROBNP in the last 168 hours.  DDimer No results for input(s): DDIMER in the last 168 hours.  Radiology/Studies:   Ct Angio Head W Or Wo Contrast Result Date: 10/14/2018 CLINICAL DATA:  67 year old female code stroke presentation yesterday with left side deficits. EXAM: CT ANGIOGRAPHY HEAD AND NECK TECHNIQUE: Multidetector CT imaging of the head and neck was performed using the [name] protocol during bolus administration of intravenous contrast. Multiplanar CT image reconstructions and MIPs were obtained to evaluate the vascular anatomy. Carotid stenosis measurements (when applicable) are obtained utilizing NASCET criteria, using the distal internal carotid diameter as the denominator. CONTRAST:  32mL ISOVUE-370 IOPAMIDOL (ISOVUE-370) INJECTION 76% COMPARISON:  Head CT without contrast 10/13/2018. CTA head and neck 08/29/2015 and 08/30/2015. FINDINGS: CTA NECK Skeleton: No acute osseous abnormality identified. Visualized paranasal sinuses and mastoids are stable and well pneumatized. Largely absent dentition. Upper chest: Negative upper lungs. No superior mediastinal lymphadenopathy. Other neck: Surgically absent thyroid as before. Otherwise negative. Aortic arch: Mild Calcified aortic atherosclerosis. Bovine type arch configuration. Stable great vessel origins since 2016 with no origin stenosis. Right carotid system: Stable mild tortuosity of the proximal right CCA. Minimal right CCA and right carotid bifurcation plaque with no right carotid stenosis in the neck. Mildly tortuous cervical right ICA. Left carotid system: Mild left CCA tortuosity. Chronic left CCA mostly calcified plaque just above the level of the thyroid with no stenosis, stable. Mild mostly  calcified plaque at the left carotid bifurcation is stable with no stenosis. Vertebral arteries: No proximal right subclavian artery stenosis. Calcified plaque near the right vertebral artery origin, but no origin stenosis. Mildly tortuous right vertebral artery is patent to the skull base without stenosis. No proximal left subclavian artery stenosis despite soft and calcified plaque. Similar mild soft and calcified plaque near the left vertebral artery origin but no origin stenosis. Patent and occasionally tortuous left vertebral artery to the skull base without stenosis. CTA HEAD Posterior circulation: Moderate to severe bilateral V4 segment calcified plaque is re- demonstrated with associated moderate to severe bilateral V4 segment stenosis distal to both PICA origins which remain patent (series 7, image 137 on the left and image 132 on the right). The distal V4 segments then remain normal. Patent vertebrobasilar junction and basilar artery without stenosis. SCA and PCA origins are patent and within normal limits. Small left posterior communicating artery is present. The right is diminutive or absent. Bilateral PCA branches are within normal limits. Anterior circulation: Moderate to severe bilateral ICA siphon calcified atherosclerosis redemonstrated. Both ophthalmic artery origins remain patent. Hemodynamically significant stenosis on the right suspected at the junction of the vertical petrous and cavernous segments, with moderate right supraclinoid the stenosis. These segments appears stable. On the left side hemodynamically significant siphon stenosis is suspected in the proximal and distal cavernous segment. The segments appears  stable. Carotid termini remain patent. MCA and ACA origins are normal. Anterior communicating artery and bilateral ACA branches are stable and within normal limits. Left MCA M1 segment, bifurcation, and left MCA branches are stable and within normal limits. Right MCA M1 segment,  bifurcation, and right MCA branches are stable and within normal limits. Venous sinuses: Patent. Anatomic variants: Bovine type aortic arch configuration. Delayed phase: Gray-white matter differentiation in the right hemisphere peers stable and within normal limits. No acute or evolving infarct is identified. No intracranial mass effect, ventriculomegaly, or hemorrhage identified. No abnormal enhancement identified. Review of the MIP images confirms the above findings IMPRESSION: 1. 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 There is comparatively little extracranial atherosclerosis, and no plaque or stenosis identified in the circle of Willis branches. 2. Stable CT appearance of the brain since yesterday. No acute or evolving infarct identified. Electronically Signed   By: Genevie Ann M.D.   On: 10/14/2018 09:27    Dg Chest 2 View Result Date: 10/14/2018 CLINICAL DATA:  Stroke x last night around 830pm, left sided numbness from neck down to her feet, HTN, diabetic, nonsmoker EXAM: CHEST - 2 VIEW COMPARISON:  04/03/2016 FINDINGS: Central pulmonary vascular congestion. No confluent airspace disease. Moderate cardiomegaly stable.  Aortic Atherosclerosis (ICD10-170.0). No effusion. Osteopenia with stable mild midthoracic compression deformity. IMPRESSION: Stable cardiomegaly and central pulmonary vascular Electronically Signed   By: Lucrezia Europe M.D.   On: 10/14/2018 08:45      Mr Brain Wo Contrast Result Date: 10/14/2018 CLINICAL DATA:  Follow up stroke. History of stroke, hyperlipidemia, hypertension, diabetes and thyroid cancer. EXAM: MRI HEAD WITHOUT CONTRAST TECHNIQUE: Multiplanar, multiecho pulse sequences of the brain and surrounding structures were obtained without intravenous contrast. COMPARISON:  CT HEAD October 14, 2018 and MRI head August 29, 2015 FINDINGS:  Multiple sequences are moderately motion degraded. INTRACRANIAL CONTENTS: 6 mm acute RIGHT pontine infarct at superior cerebellar peduncle with low ADC values. A few scattered chronic microhemorrhages noted. Patchy supratentorial white matter T2 hyperintensities. Old pontine lacunar infarcts. Prominent basal ganglia perivascular spaces associated with chronic small vessel ischemic changes. No parenchymal brain volume loss for age. No midline shift, mass effect or masses. No abnormal extra-axial fluid collections. Basal cisterns are patent. VASCULAR: Normal major intracranial vascular flow voids present at skull base. SKULL AND UPPER CERVICAL SPINE: No abnormal sellar expansion. No suspicious calvarial bone marrow signal. Craniocervical junction maintained. SINUSES/ORBITS: The mastoid air-cells and included paranasal sinuses are well-aerated.The included ocular globes and orbital contents are non-suspicious. Status post bilateral ocular lens implants. OTHER: Patient is edentulous. IMPRESSION: 1. Motion degraded examination. Acute subcentimeter RIGHT pontine infarct. 2. 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 Electronically Signed   By: Elon Alas M.D.   On: 10/14/2018 20:29    Ct Head Code Stroke Wo Contrast Result Date: 10/13/2018 CLINICAL DATA:  Code stroke.  Left-sided weakness. EXAM: CT HEAD WITHOUT CONTRAST TECHNIQUE: Contiguous axial images were obtained from the base of the skull through the vertex without intravenous contrast. COMPARISON:  04/03/2016 FINDINGS: Brain: No evidence of acute infarction, hemorrhage, hydrocephalus, extra-axial collection or mass lesion/mass effect. 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 Mild, age congruent cerebral volume loss. Vascular: Atherosclerotic calcification.  No hyperdense vessel. Skull: Normal. Negative for fracture or focal lesion. Sinuses/Orbits: No acute finding. Other: These results were communicated to  Dr. Leonel Ramsay at  9:41 pmon 10/20/2019by text page via the [facility] messaging system. ASPECTS [facility] Stroke Program Early CT Score) - Ganglionic level infarction (caudate, lentiform nuclei, internal capsule, insula, M1-M3 cortex): 7 - Supraganglionic infarction (M4-M6 cortex): 3 Total score (0-10 with 10 being normal): 10 IMPRESSION: 1. No acute finding.  ASPECTS is 10. 2. 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 Electronically Signed   By: Monte Fantasia M.D.   On: 10/13/2018 21:43    Assessment and Plan:   1. Stroke     In review of neurology notes, felt to secondary to small vessel disease, not felt to be embolic  We have reviewed all of her EKGs, CV strips in Epic as well as current hospitalization telemetry She has some AT, very frequent atrial ectopy, some blocked PACS, no clear AFib, agree, no a/c at this point from a rhythm perspective Abnormal P wave morphology on her EKG with some LA dilation on her echo (72mm)  It appears she has been ordered for event monitoring, agree this is the better first step prior to loop. High likely-hood she has or [name] develop AFib, though not yet seen Would add beta blockaide to her management  EP Kamaiyah Uselton sign off though remain available, please recall if needed Happy to see out patient for loop if event monitoring is unrevealing     For questions or updates, please contact [facility] Please consult [email or web address] for contact info under     Signed, Baldwin Jamaica, PA-C  10/16/2018 9:24 AM  I have seen and examined this patient with [name].  Agree with above, note added to reflect my findings.  On exam, RRR, no murmurs, lungs clear.  She admitted to the hospital after second stroke.  We are being consulted for atrial fibrillation monitoring.  At this point, due to her high frequency of P ACs, I feel that her odds of having atrial fibrillation are quite high.  I would recommend a 30-day monitor.  Should she have no atrial fibrillation noted on this monitor, she  would likely benefit from a Linq monitor in the future.  Tomasa Dobransky M. Ezra Denne MD 10/16/2018 3:04 PM

## 2018-10-16 NOTE — Care Management Important Message (Signed)
Important Message  Patient Details  Name: [name] MRN: [id number] Date of Birth: 24-Mar-1951   Medicare Important Message Given:  Yes    [name] 10/16/2018, 2:15 PM

## 2018-10-16 NOTE — Progress Notes (Addendum)
Patient discharged in stable condition with all belongings and family at bedside. She verbalized understanding of all discharge instructions and importance of follow up visits. Patient understood home health OT and PT ordered with [facility] and them to call her to set up a time to come. Claiborne Billings spoke with her today to set up home health a note was not seen to confirm and on call case manager was called with no response. She does have her 3 in 1.

## 2018-10-16 NOTE — Progress Notes (Signed)
Physical Therapy Treatment Patient Details Name: [name] MRN: [id number] DOB: 07-Jan-1951 Today's Date: 10/16/2018    History of Present Illness Patient is a 67 y/o female presenting to the ED on 10/13/18 wiht primary complaints of L sided weakness. CT head showing no acute findings. MRI positive:Acute subcentimeter RIGHT pontine infarct. Marland Kitchen PMH significant for previous stroke, T1DM, HLD, Hypothyroidism, HTN, GERD.     PT Comments    Pt more unsteady this session with ambulation, requiring min A x2 for LOB. Pt also requiring assist for stability with transfers. Pt would continue to benefit from skilled physical therapy services at this time while admitted and after d/c to address the below listed limitations in order to improve overall safety and independence with functional mobility.    Follow Up Recommendations  Home health PT;Supervision/Assistance - 24 hour     Equipment Recommendations  None recommended by PT    Recommendations for Other Services       Precautions / Restrictions Precautions Precautions: Fall Restrictions Weight Bearing Restrictions: No    Mobility  Bed Mobility               General bed mobility comments: seated in recliner upon entry  Transfers Overall transfer level: Needs assistance Equipment used: Rolling walker (2 wheeled) Transfers: Sit to/from Stand Sit to Stand: Min assist         General transfer comment: increased time and effort, cueing to scoot forwards in chair, use of momentum, good hand placement, min A to power into standing and for stability from recliner chair x1, and from toilet x1  Ambulation/Gait Ambulation/Gait assistance: Min guard;Min assist Gait Distance (Feet): 200 Feet Assistive device: Rolling walker (2 wheeled) Gait Pattern/deviations: Decreased stride length;Step-through pattern;Decreased step length - right;Decreased step length - left;Drifts right/left Gait velocity: decreased   General  Gait Details: pt with mild instability and minor LOB x2 requiring min A to recover   Stairs             Wheelchair Mobility    Modified Rankin (Stroke Patients Only) Modified Rankin (Stroke Patients Only) Pre-Morbid Rankin Score: No symptoms Modified Rankin: Moderately severe disability     Balance Overall balance assessment: Needs assistance Sitting-balance support: Feet supported Sitting balance-Leahy Scale: Good     Standing balance support: Bilateral upper extremity supported;Single extremity supported Standing balance-Leahy Scale: Poor                              Cognition Arousal/Alertness: Awake/alert Behavior During Therapy: WFL for tasks assessed/performed Overall Cognitive Status: Within Functional Limits for tasks assessed                                        Exercises      General Comments        Pertinent Vitals/Pain Pain Assessment: No/denies pain    Home Living                      Prior Function            PT Goals (current goals can now be found in the care plan section) Acute Rehab PT Goals PT Goal Formulation: With patient Time For Goal Achievement: 10/28/18 Potential to Achieve Goals: Good Progress towards PT goals: Progressing toward goals    Frequency    Min 4X/week  PT Plan Current plan remains appropriate    Co-evaluation              AM-PAC PT "6 Clicks" Daily Activity  Outcome Measure  Difficulty turning over in bed (including adjusting bedclothes, sheets and blankets)?: A Little Difficulty moving from lying on back to sitting on the side of the bed? : A Little Difficulty sitting down on and standing up from a chair with arms (e.g., wheelchair, bedside commode, etc,.)?: Unable Help needed moving to and from a bed to chair (including a wheelchair)?: A Little Help needed walking in hospital room?: A Little Help needed climbing 3-5 steps with a railing? : A Lot 6  Click Score: 15    End of Session   Activity Tolerance: Patient tolerated treatment well Patient left: in chair;with call bell/phone within reach;with chair alarm set;with family/visitor present Nurse Communication: Mobility status PT Visit Diagnosis: Unsteadiness on feet (R26.81);Other abnormalities of gait and mobility (R26.89);Muscle weakness (generalized) (M62.81)     Time: 8264-1583 PT Time Calculation (min) (ACUTE ONLY): 16 min  Charges:  $Gait Training: 8-22 mins                     Sherie Don, [place], DPT  Acute Rehabilitation Services Pager [phone] Office [place] 10/16/2018, 10:32 AM

## 2018-10-16 NOTE — CV Procedure (Signed)
Brief TEE report. Full report to follow in [place].  No LA/LAA or RA/RAA thrombus. No PFO by color doppler. Late trivial bubbles seen after agitated saline, suggests possible non-cardiac shunt.  Overall no definitive evidence for cardiac source of embolism   During this procedure the patient is administered a total of Versed 8 mg and Fentanyl 75 mcg to achieve and maintain moderate conscious sedation.  The patient's heart rate, blood pressure, and oxygen saturation are monitored continuously during the procedure. The period of conscious sedation is 24 minutes, of which I was present face-to-face 100% of this time.  Buford Dresser, MD, PhD [facility]  [address] [place], [place] [address] [phone]

## 2018-10-17 ENCOUNTER — Encounter: Payer: Self-pay | Admitting: Family Medicine

## 2018-10-17 ENCOUNTER — Encounter (HOSPITAL_COMMUNITY): Payer: Self-pay | Admitting: Cardiology

## 2018-10-17 LAB — GLUCOSE, CAPILLARY: Glucose-Capillary: 229 mg/dL — ABNORMAL HIGH (ref 70–99)

## 2018-10-17 NOTE — Care Management Note (Signed)
Case Management Note  Patient Details  Name: [name] MRN: [id number] Date of Birth: 11/08/1951  Subjective/Objective:                    Action/Plan: 10/17/2018 at 10:28 am: Pt discharged home late yesterday with orders for [facility] services. CM had provided the patient choice earlier in the day and she selected [place]. Butch Penny with [facility] made aware of referral.  Pt with orders for 3 in 1. Pt had received DME prior to d/c. Pt had transport home.   Expected Discharge Date:  10/16/18               Expected Discharge Plan:  [place]  In-House Referral:     Discharge planning Services  CM Consult, [place]  Post Acute Care Choice:  Durable Medical Equipment Choice offered to:  Patient  DME Arranged:  3-N-1 DME Agency:  [place]:  PT, OT [facility] Agency:  [place]  Status of Service:  Completed, signed off  If discussed at [place] of Stay Meetings, dates discussed:    Additional Comments:  Pollie Friar, RN 10/17/2018, 10:27 AM

## 2018-10-18 ENCOUNTER — Telehealth: Payer: Self-pay | Admitting: *Deleted

## 2018-10-18 ENCOUNTER — Other Ambulatory Visit: Payer: Self-pay | Admitting: Family Medicine

## 2018-10-18 DIAGNOSIS — I639 Cerebral infarction, unspecified: Secondary | ICD-10-CM

## 2018-10-18 NOTE — Telephone Encounter (Signed)
Pt would like for Dr. Enid Derry to call her, she is really anxious about what to expect in recovering from the stroke.    Spoke with patient 10+ minutes.  She is reassured for now but has appt on 10/29/18 with Dr. Enid Derry.  Do not see order for Wheelchair.  Will forward to MD to advise. Joanathan Affeldt, Salome Spotted, CMA

## 2018-10-18 NOTE — Telephone Encounter (Signed)
Spoke with patient who is feeling better. Physical therapy worked with her today too and she feels like she is going to improve. Will keep her current appointment.

## 2018-10-21 ENCOUNTER — Telehealth: Payer: Self-pay

## 2018-10-21 ENCOUNTER — Other Ambulatory Visit: Payer: Self-pay

## 2018-10-21 NOTE — Telephone Encounter (Signed)
Tharon Aquas, PT with [facility], needs verbal orders to continue [facility] PT:   1x/week x 1 week 2x/week x 3 weeks 1x/week x 1 week  For ambulation, balance, strengthening, endurance.  Call back is [phone]. Ok to leave message.  Danley Danker, RN [facility] RN)

## 2018-10-21 NOTE — Telephone Encounter (Signed)
Spoke with Tharon Aquas and provided verbal order.

## 2018-10-21 NOTE — Patient Outreach (Signed)
[place] [facility]) Care Management  10/21/2018  [name] [name] 04/29/1951 [id number]  EMMI: stroke red alert Referral date: 10/21/18 Referral reason: Feeling worse overall: yes,  New problems walking/ talking/ speaking/ seeing: yes Insurance: [place] health care Day # 1  Telephone call to patient regarding EMMI stroke red alert. HIPAA verified. Explained reason for call. Patient states, " I feel the same but my neck is getting stiff."  Patient states she is very concerned. Patient reports neck stiffness that is moving to her face. Patient states this is a new symptom today.  Patient states, " I feel funny around my mouth."  Patient states her family is at work and is unable to help her at this time. Patient states, "I am scared."  Patient states she does not know what to do.  RNCM advised patient to call 911.  Patient verbalized understanding.  Patient states she has a follow up appointment scheduled with her primary MD on 10/29/18 and has an appointment with cardiology for a heart monitor on 10/28/18.   Patient states, " I feel like I am swollen."  Patient reports swelling in her legs.  Again, RNCM advised patient to call 911. Patient verbalized agreement.   RNCM called cardiology office to confirm follow up appointment regarding event monitor.  Spoke with Alwyn Ren with cardiology office.  Alwyn Ren confirmed patients appointment for heart monitor on 10/28/18.  Requested appointment to be scheduled for follow up post event monitor.  Alwyn Ren scheduled follow up appointment with Truitt Merle on 12/10/ 19 at 1:30pm.  RNCM informed Alwyn Ren she would inform patient of this appointment.   PLAN: RNCM will follow up with patient within 2 business days.   Quinn Plowman RN,BSN,CCM [facility] Telephonic  [phone]

## 2018-10-22 ENCOUNTER — Ambulatory Visit: Payer: Self-pay

## 2018-10-23 ENCOUNTER — Other Ambulatory Visit: Payer: Self-pay

## 2018-10-23 NOTE — Patient Outreach (Signed)
[place] [facility]) Care Management  10/23/2018  [name] Kennley Schwandt 12/17/1951 [id number]  EMMI stroke follow up Referral date: 10/21/18 Referral reason: Feeling worse overall: yes,  New problems walking/ talking/ speaking/ seeing: yes Insurance: [place] health care  Telephone call to patient regarding EMMI stroke follow up. HIPAA verified. Patient states she call 911 as advised by [facility] on 10/21/18.  Patient reports her blood pressure was elevated. Patient states she was given the option to go to the emergency department. Patient states she elected not to go. Patient states she recalled she had taken cold medication that day that she believes elevated her blood pressure. Patient state she has recently had her blood pressure checked by her home health therapist and her blood pressure has returned to normal. Patient states she has a blood pressure monitor at home. RNCM advised patient to start recording her blood pressures daily.  Advised patient to take recorded blood pressure readings to her primary MD visit on 10/29/18.   Patient denies any further needs/ concerns at this time.   PLAN; RNCM will close patient due to patient being assessed and having no further needs.   Quinn Plowman RN,BSN,CCM [facility] Telephonic  [phone]

## 2018-10-24 ENCOUNTER — Encounter (INDEPENDENT_AMBULATORY_CARE_PROVIDER_SITE_OTHER): Payer: Medicare Other | Admitting: Ophthalmology

## 2018-10-24 ENCOUNTER — Encounter: Payer: Self-pay | Admitting: Family Medicine

## 2018-10-24 ENCOUNTER — Telehealth: Payer: Self-pay | Admitting: *Deleted

## 2018-10-24 NOTE — Telephone Encounter (Signed)
Manuela Schwartz from [facility] calling for OT verbal orders as follows:  1 time(s) weekly for 3 week(s)  You can leave verbal orders on confidential voicemail.  Bader Stubblefield, Salome Spotted, CMA

## 2018-10-24 NOTE — Telephone Encounter (Signed)
Called and left voicemail for verbal orders.

## 2018-10-25 ENCOUNTER — Emergency Department (HOSPITAL_COMMUNITY)
Admission: EM | Admit: 2018-10-25 | Discharge: 2018-10-25 | Disposition: A | Discharge status: Home / Self Care | Payer: Medicare Other | Attending: Emergency Medicine | Admitting: Emergency Medicine

## 2018-10-25 ENCOUNTER — Encounter (HOSPITAL_COMMUNITY): Payer: Self-pay | Admitting: Emergency Medicine

## 2018-10-25 ENCOUNTER — Other Ambulatory Visit: Payer: Self-pay

## 2018-10-25 ENCOUNTER — Emergency Department (HOSPITAL_COMMUNITY): Payer: Medicare Other

## 2018-10-25 DIAGNOSIS — R0789 Other chest pain: Secondary | ICD-10-CM | POA: Diagnosis not present

## 2018-10-25 DIAGNOSIS — Z7982 Long term (current) use of aspirin: Secondary | ICD-10-CM | POA: Diagnosis not present

## 2018-10-25 DIAGNOSIS — K219 Gastro-esophageal reflux disease without esophagitis: Secondary | ICD-10-CM

## 2018-10-25 DIAGNOSIS — E109 Type 1 diabetes mellitus without complications: Secondary | ICD-10-CM | POA: Insufficient documentation

## 2018-10-25 DIAGNOSIS — I110 Hypertensive heart disease with heart failure: Secondary | ICD-10-CM | POA: Diagnosis not present

## 2018-10-25 DIAGNOSIS — I509 Heart failure, unspecified: Secondary | ICD-10-CM | POA: Insufficient documentation

## 2018-10-25 DIAGNOSIS — Z79899 Other long term (current) drug therapy: Secondary | ICD-10-CM | POA: Insufficient documentation

## 2018-10-25 DIAGNOSIS — E039 Hypothyroidism, unspecified: Secondary | ICD-10-CM | POA: Insufficient documentation

## 2018-10-25 LAB — CBC
HEMATOCRIT: 38.5 % (ref 36.0–46.0)
HEMOGLOBIN: 11.6 g/dL — AB (ref 12.0–15.0)
MCH: 27.8 pg (ref 26.0–34.0)
MCHC: 30.1 g/dL (ref 30.0–36.0)
MCV: 92.3 fL (ref 80.0–100.0)
Platelets: 243 10*3/uL (ref 150–400)
RBC: 4.17 MIL/uL (ref 3.87–5.11)
RDW: 11.9 % (ref 11.5–15.5)
WBC: 4.2 10*3/uL (ref 4.0–10.5)
nRBC: 0 % (ref 0.0–0.2)

## 2018-10-25 LAB — CBG MONITORING, ED
GLUCOSE-CAPILLARY: 148 mg/dL — AB (ref 70–99)
GLUCOSE-CAPILLARY: 146 mg/dL — AB (ref 70–99)
Glucose-Capillary: 142 mg/dL — ABNORMAL HIGH (ref 70–99)

## 2018-10-25 LAB — BASIC METABOLIC PANEL
ANION GAP: 7 (ref 5–15)
BUN: 6 mg/dL — ABNORMAL LOW (ref 8–23)
CHLORIDE: 103 mmol/L (ref 98–111)
CO2: 25 mmol/L (ref 22–32)
Calcium: 8.8 mg/dL — ABNORMAL LOW (ref 8.9–10.3)
Creatinine, Ser: 1.01 mg/dL — ABNORMAL HIGH (ref 0.44–1.00)
GFR calc Af Amer: >60 mL/min (ref >60)
GFR, EST NON AFRICAN AMERICAN: 56 mL/min — AB (ref >60)
Glucose, Bld: 163 mg/dL — ABNORMAL HIGH (ref 70–99)
POTASSIUM: 3.7 mmol/L (ref 3.5–5.1)
SODIUM: 135 mmol/L (ref 135–145)

## 2018-10-25 LAB — I-STAT TROPONIN, ED
TROPONIN I, POC: 0 ng/mL (ref 0.00–0.08)
TROPONIN I, POC: 0 ng/mL (ref 0.00–0.08)

## 2018-10-25 MED ORDER — PANTOPRAZOLE SODIUM 20 MG PO TBEC: 20.0000 mg | DELAYED_RELEASE_TABLET | Freq: Every day | ORAL | 0 refills | Status: DC

## 2018-10-25 MED ORDER — SIMETHICONE 40 MG/0.6ML PO SUSP (UNIT DOSE)
40.0000 mg | Freq: Once | ORAL | Status: AC
  Administered 2018-10-25: 40 mg via ORAL
  Filled 2018-10-25: qty 0.6

## 2018-10-25 MED ORDER — ALUM & MAG HYDROXIDE-SIMETH 200-200-20 MG/5ML PO SUSP
30.0000 mL | Freq: Once | ORAL | Status: AC
  Administered 2018-10-25: 30 mL via ORAL
  Filled 2018-10-25: qty 30

## 2018-10-25 NOTE — ED Provider Notes (Signed)
[place] EMERGENCY DEPARTMENT Provider Note   CSN: [id number] Arrival date & time: 10/25/18  1044     History   Chief Complaint Chief Complaint  Patient presents with  . Chest Pain    HPI [name] [name] is a 67 y.o. female.  The history is provided by the patient. No language interpreter was used.  Chest Pain     [name] [name] is a 67 y.o. female who presents to the Emergency Department complaining of chest pain. Since to the emergency department complaining of indigestion and chest discomfort that began yesterday. She does have a history of CHF, CVA, diabetes. Her symptoms began yesterday and are intermittent in nature. She states it feels like she needs to bulge but cannot belch. She denies any fevers, shortness of breath, vomiting, abdominal pain, diarrhea, dysuria. She does report some mild lower extremity edema. She was recently admitted to the hospital for CVA and was started on aspirin. She is scheduled to follow-up with cardiology on Monday for evaluation for atrial fibrillation with monitor. Past Medical History:  Diagnosis Date  . Anemia   . Arthritis   . CHF (congestive heart failure) ([place])   . Depression   . GERD (gastroesophageal reflux disease)   . Hemorrhoids   . Hyperlipidemia   . Hypertension   . Hypothyroidism   . Seasonal allergies   . Stroke ([place])   . Thyroid cancer ([place])    per pt S/p Total Thyroidectomy with Radioactive Iodine Therapy  . Type 1 diabetes mellitus [facility])     Patient Active Problem List   Diagnosis Date Noted  . Abnormal echocardiogram   . Stroke (cerebrum) ([place]) 10/13/2018  . Frequent PVCs   . Uncontrolled type 1 diabetes mellitus ([place])   . BPPV (benign paroxysmal positional vertigo), right 06/13/2017  . Premature atrial beats 06/13/2017  . Snoring 07/31/2016  . Pre-ulcerative corn or callous 12/15/2015  . History of CHF (congestive heart failure) 10/29/2015  . Essential  hypertension 10/29/2015  . Cerebrovascular accident (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 ([place]) 10/29/2015  . Thyroid activity decreased   . Intracranial vascular stenosis   . HLD (hyperlipidemia)   . Cerebral infarction due to thrombosis of basilar artery ([place])   . CVA (cerebral vascular accident) ([place]) 08/29/2015  . Posterior tibial tendinitis of right leg 04/26/2014  . Atypical chest pain 10/06/2013  . Morbid obesity ([place]) 05/29/2013  . Back pain without radiation 04/28/2013  . Nail dystrophy 01/10/2013  . Diabetes mellitus ([place]) 01/25/2012  . Hyperlipidemia 01/25/2012  . Hypothyroidism 01/25/2012  . Hypertension 01/25/2012  . GERD (gastroesophageal reflux disease) 01/25/2012    Past Surgical History:  Procedure Laterality Date  . ABDOMINAL ANGIOGRAM N/A 05/14/2012   Procedure: ABDOMINAL ANGIOGRAM;  Surgeon: Laverda Page, MD;  Location: [facility] CATH LAB;  Service: Cardiovascular;  Laterality: N/A;  . ABDOMINAL HYSTERECTOMY  1995   partial  . ANKLE FRACTURE SURGERY Right   . CARDIAC CATHETERIZATION     with coronary angiogram  . COLONOSCOPY N/A 09/09/2014   Procedure: COLONOSCOPY;  Surgeon: Gatha Mayer, MD;  Location: [facility];  Service: Endoscopy;  Laterality: N/A;  . LEFT AND RIGHT HEART CATHETERIZATION WITH CORONARY ANGIOGRAM N/A 05/14/2012   Procedure: LEFT AND RIGHT HEART CATHETERIZATION WITH CORONARY ANGIOGRAM;  Surgeon: Laverda Page, MD;  Location: [facility] CATH LAB;  Service: Cardiovascular;  Laterality: N/A;  . MINI METER   09/08/2014   ELECTRONIC INSULIN PUMP  . TEE WITHOUT  CARDIOVERSION N/A 10/16/2018   Procedure: TRANSESOPHAGEAL ECHOCARDIOGRAM (TEE);  Surgeon: Buford Dresser, MD;  Location[facility] ENDOSCOPY;  Service: Cardiovascular;  Laterality: N/A;  . THYROIDECTOMY  2006     OB History   None      Home Medications    Prior to Admission medications   Medication Sig Start Date End Date Taking? Authorizing Provider  acetaminophen (TYLENOL) 500  MG tablet Take 1,000 mg by mouth as needed for mild pain.   Yes [provider]  aspirin 325 MG tablet Take 1 tablet (325 mg total) by mouth daily. 10/17/18  Yes Guadalupe Dawn, MD  atorvastatin (LIPITOR) 80 MG tablet TAKE 1 TABLET BY MOUTH DAILY 10/17/18  Yes Shirley, [name], DO  clopidogrel (PLAVIX) 75 MG tablet TAKE 1 TABLET BY MOUTH EVERY DAY Patient taking differently: Take 75 mg by mouth daily.  07/15/18  Yes Enid Derry, [name], DO  CRANBERRY PO Take 1 tablet by mouth daily.   Yes [provider]  Dextromethorphan-guaiFENesin (CORICIDIN HBP CONGESTION/COUGH PO) Take 1 tablet by mouth 2 (two) times daily.   Yes [provider]  fexofenadine (ALLEGRA) 30 MG tablet Take 30 mg by mouth daily as needed (allergies).    Yes [provider]  Insulin Human (INSULIN PUMP) SOLN Inject into the skin continuous. humalog insulin   Yes [provider]  levothyroxine (SYNTHROID, LEVOTHROID) 175 MCG tablet Take 175 mcg by mouth daily. 06/06/17  Yes [provider]  losartan-hydrochlorothiazide (HYZAAR) 100-12.5 MG tablet Take 1 tablet by mouth daily. 01/30/18  Yes Guadalupe Dawn, MD  metoprolol succinate (TOPROL-XL) 50 MG 24 hr tablet TAKE 1 TABLET BY MOUTH DAILY 10/17/18  Yes Enid Derry, [name], DO  multivitamin-lutein [facility]) CAPS capsule Take 1 capsule by mouth daily.   Yes [provider]  Potassium 99 MG TABS Take 99 mg by mouth daily.   Yes [provider]  ranitidine (ZANTAC) 150 MG tablet TAKE 1 TABLET BY MOUTH TWICE DAILY Patient taking differently: Take 150 mg by mouth 2 (two) times daily.  09/23/18  Yes Shirley, [name], DO  clopidogrel (PLAVIX) 75 MG tablet Take 1 tablet (75 mg total) by mouth daily. Patient not taking: Reported on 10/25/2018 10/17/18   Guadalupe Dawn, MD  pantoprazole (PROTONIX) 20 MG tablet Take 1 tablet (20 mg total) by mouth daily. 10/25/18   Quintella Reichert, MD    Family History Family History  Problem  Relation Age of Onset  . Heart disease Mother        MI in 43s  . Hyperlipidemia Mother   . Hypertension Mother   . Renal Disease Mother        insufficiency  . Early death Father        Head Injury at Work  . Hyperlipidemia Father   . Hypertension Father   . Stroke Son   . Diabetes Maternal Aunt   . Prostate cancer Paternal Uncle   . Heart disease Maternal Aunt   . Heart failure Maternal Grandfather        Died of "heart attack" in his 77s  . Heart disease Maternal Grandfather   . Breast cancer Neg Hx     Social History Social History   Tobacco Use  . Smoking status: Never Smoker  . Smokeless tobacco: Never Used  Substance Use Topics  . Alcohol use: No  . Drug use: No     Allergies   Patient has no known allergies.   Review of Systems Review of Systems  Cardiovascular: Positive for  chest pain.  All other systems reviewed and are negative.    Physical Exam Updated Vital Signs BP (!) 159/72 (BP Location: Right Arm)   Pulse 70   Temp 98.2 F (36.8 C)   Resp 19   SpO2 100%   Physical Exam  Constitutional: She is oriented to person, place, and time. She appears well-developed and well-nourished.  HENT:  Head: Normocephalic and atraumatic.  Cardiovascular:  No murmur heard. Irregular rhythm  Pulmonary/Chest: Effort normal and breath sounds normal. No respiratory distress.  Abdominal: Soft. There is no tenderness. There is no rebound and no guarding.  Musculoskeletal: She exhibits no tenderness.  One plus pitting edema to bilateral lower extremities  Neurological: She is alert and oriented to person, place, and time.  Skin: Skin is warm and dry.  Psychiatric: She has a normal mood and affect. Her behavior is normal.  Nursing note and vitals reviewed.    ED Treatments / Results  Labs (all labs ordered are listed, but only abnormal results are displayed) Labs Reviewed  BASIC METABOLIC PANEL - Abnormal; Notable for the following components:       Result Value   Glucose, Bld 163 (*)    BUN 6 (*)    Creatinine, Ser 1.01 (*)    Calcium 8.8 (*)    GFR calc non Af Amer 56 (*)    All other components within normal limits  CBC - Abnormal; Notable for the following components:   Hemoglobin 11.6 (*)    All other components within normal limits  CBG MONITORING, ED - Abnormal; Notable for the following components:   Glucose-Capillary 148 (*)    All other components within normal limits  CBG MONITORING, ED - Abnormal; Notable for the following components:   Glucose-Capillary 146 (*)    All other components within normal limits  CBG MONITORING, ED - Abnormal; Notable for the following components:   Glucose-Capillary 142 (*)    All other components within normal limits  I-STAT TROPONIN, ED  I-STAT TROPONIN, ED    EKG EKG Interpretation  Date/Time:  Friday October 25 2018 10:51:49 EDT Ventricular Rate:  76 PR Interval:  156 QRS Duration: 90 QT Interval:  426 QTC Calculation: 479 R Axis:   37 Text Interpretation:  Sinus rhythm with frequent Premature ventricular complexes in a pattern of bigeminy Nonspecific T wave abnormality Prolonged QT Abnormal ECG Confirmed by Quintella Reichert [phone]) on 10/25/2018 11:30:05 AM   Radiology Dg Chest 2 View  Result Date: 10/25/2018 CLINICAL DATA:  Chest pain and indigestion starting yesterday. EXAM: CHEST - 2 VIEW COMPARISON:  10/14/2018 FINDINGS: Atherosclerotic calcification of the aortic arch. Thoracic spondylosis. Mild cardiomegaly. Upper zone pulmonary vascular prominence suggesting pulmonary venous hypertension. No overt edema. The lungs appear otherwise clear. No pleural effusion identified. IMPRESSION: 1. Mild enlargement of the cardiopericardial silhouette with pulmonary venous hypertension but no overt edema. Similar to prior. 2.  Aortic Atherosclerosis (ICD10-I70.0). Electronically Signed   By: Van Clines M.D.   On: 10/25/2018 11:54    Procedures Procedures (including critical  care time)  Medications Ordered in ED Medications  alum & mag hydroxide-simeth (MAALOX/MYLANTA) 200-200-20 MG/5ML suspension 30 mL (30 mLs Oral Given 10/25/18 1226)  simethicone (MYLICON) 40 GU/4.4IH suspension 40 mg (40 mg Oral Given 10/25/18 1417)     Initial Impression / Assessment and Plan / ED Course  I have reviewed the triage vital signs and the nursing notes.  Pertinent labs & imaging results that were available during my care of  the patient were reviewed by me and considered in my medical decision making (see chart for details).     Patient here for evaluation of chest pain, indigestion type sensation. EKG does demonstrate diffuse T-wave abnormalities with frequent PACs. Her pain did improve following treatment in the emergency department. Troponin is negative times two. Presentation is not consistent with PE. Cardiology evaluated the patient in the emergency department. Plan to discharge home with outpatient follow-up and return precautions.  Final Clinical Impressions(s) / ED Diagnoses   Final diagnoses:  Atypical chest pain    ED Discharge Orders         Ordered    pantoprazole (PROTONIX) 20 MG tablet  Daily     10/25/18 1514           Quintella Reichert, MD 10/25/18 1540

## 2018-10-25 NOTE — Consult Note (Signed)
Cardiology Consult    Patient ID: [name] MRN: [id number], DOB/AGE: 1951-06-06   Admit date: 10/25/2018 Date of Consult: 10/25/2018  Primary Physician: Shirley, [name], DO Primary Cardiologist: Candee Furbish, MD Requesting Provider: Quintella Reichert, MD  Patient Profile    [name] is a 67 y.o. female with a history of diastolic congestive heart failure, hypertension, hyperlipidemia, type 1 diabetes mellitus, CVA in 2016 and again recently on 10/16/2018, hypothyroidism s/p total thyroidectomy, and GERD, who is being seen today for the evaluation of chest pain at the request of Dr. Ralene Bathe.  History of Present Illness    [name] is a 67 year old African-American female with the above history who was previously followed by Dr. Einar Gip for both CHF and CAD. She saw Dr. Marlou Porch one time in January 2017 for evaluation of CHF at which time she reported dyspnea on exertion. This was felt to be due to a combination of deconditioning, morbid obesity, and secondary pulmonary hypertension. She was advised to follow-up on an as needed basis. Patient was recently admitted from 10/13/2018 to 10/16/2018 for an acute pontine infarct while on Plavix after presenting with sudden onset of left sided numbness. Cardiology was consulted during admission for atrial fibrillation noted on echocardiogram. However, upon further review of EKG's and telemetry strips, she was felt to have some atrial tachycardia with very frequent atrial ectomy and some blocked PACs but no clear atrial fibrillation. However, she was felt to be at a high-risk of having or developing paroxysmal atrial fibrillation, so an Event Monitor was order. She is scheduled to follow-up next Monday 10/28/2018 to get the Event Monitor set up and start the study.   [facility] today for evaluation of chest discomfort. Patient describes the discomfort as a tightness/pressure that spreads across her chest  and to her left shoulder. Onset occurred around 7:30 this morning about 30 minutes after she had taken all of her medications. She ranks the discomfort as a 7/10 on the pain scale. She does have some numbness down her left side but states this is from her recent stroke. She denies any associated shortness of breath, diaphoresis, palpitations, or nausea. She denies any exertional chest pain or shortness of breath. She has a history of GERD and thinks this chest discomfort is all from indigestion. She was previously on Omeprazole for her GERD but was reportedly told to stop taking that due to her type 1 diabetes. She is currently taking Zantac 150mg  twice daily but states that does not seem to help. Patient notes improvement when she burps.   Upon arrival to the ED, patient hypertensive at 193/66. EKG showed sinus rhythm with PVCs in bigeminy pattern but no acute ischemic changes. I-stat troponin negative. Chest x-ray showed mild enlargement of the cardiopericardial silhouette with pulmonary venous hypertension but no overt edema. WBC 4.2, Hgb 11.6, Plts 243. Na 135, K 3.7, Glucose 163, SCr 1.01.   Currently, patient continues to reports some chest pain but states it has significantly improved. Patient states she does not want to stay for any additional testing.  She denies any tobacco, alcohol, or drug use. She does have a family history of heart disease on her mother's side of the family.   Past Medical History   Past Medical History:  Diagnosis Date  . Anemia   . Arthritis   . CHF (congestive heart failure) ([place])   . Depression   . GERD (gastroesophageal reflux disease)   .  Hemorrhoids   . Hyperlipidemia   . Hypertension   . Hypothyroidism   . Seasonal allergies   . Stroke ([place])   . Thyroid cancer ([place])    per pt S/p Total Thyroidectomy with Radioactive Iodine Therapy  . Type 1 diabetes mellitus ([place])     Past Surgical History:  Procedure Laterality Date  . ABDOMINAL ANGIOGRAM N/A  05/14/2012   Procedure: ABDOMINAL ANGIOGRAM;  Surgeon: Laverda Page, MD;  Location: [facility] CATH LAB;  Service: Cardiovascular;  Laterality: N/A;  . ABDOMINAL HYSTERECTOMY  1995   partial  . ANKLE FRACTURE SURGERY Right   . CARDIAC CATHETERIZATION     with coronary angiogram  . COLONOSCOPY N/A 09/09/2014   Procedure: COLONOSCOPY;  Surgeon: Gatha Mayer, MD;  Location: [facility];  Service: Endoscopy;  Laterality: N/A;  . LEFT AND RIGHT HEART CATHETERIZATION WITH CORONARY ANGIOGRAM N/A 05/14/2012   Procedure: LEFT AND RIGHT HEART CATHETERIZATION WITH CORONARY ANGIOGRAM;  Surgeon: Laverda Page, MD;  Location: [facility] CATH LAB;  Service: Cardiovascular;  Laterality: N/A;  . MINI METER   09/08/2014   ELECTRONIC INSULIN PUMP  . TEE WITHOUT CARDIOVERSION N/A 10/16/2018   Procedure: TRANSESOPHAGEAL ECHOCARDIOGRAM (TEE);  Surgeon: Buford Dresser, MD;  Location: [facility] ENDOSCOPY;  Service: Cardiovascular;  Laterality: N/A;  . THYROIDECTOMY  2006     Allergies  No Known Allergies  Inpatient Medications      Family History    Family History  Problem Relation Age of Onset  . Heart disease Mother        MI in 39s  . Hyperlipidemia Mother   . Hypertension Mother   . Renal Disease Mother        insufficiency  . Early death Father        Head Injury at Work  . Hyperlipidemia Father   . Hypertension Father   . Stroke Son   . Diabetes Maternal Aunt   . Prostate cancer Paternal Uncle   . Heart disease Maternal Aunt   . Heart failure Maternal Grandfather        Died of "heart attack" in his 36s  . Heart disease Maternal Grandfather   . Breast cancer Neg Hx    She indicated that her mother is deceased. She indicated that her father is deceased. She indicated that her maternal grandfather is deceased. She indicated that her son is alive. She indicated that the status of her paternal uncle is unknown. She indicated that the status of her neg hx is unknown.   Social History      Social History   Socioeconomic History  . Marital status: Divorced    Spouse name: Not on file  . Number of children: 1  . Years of education: Not on file  . Highest education level: Not on file  Occupational History  . Occupation: retired Tour manager  . Financial resource strain: Not on file  . Food insecurity:    Worry: Not on file    Inability: Not on file  . Transportation needs:    Medical: Not on file    Non-medical: Not on file  Tobacco Use  . Smoking status: Never Smoker  . Smokeless tobacco: Never Used  Substance and Sexual Activity  . Alcohol use: No  . Drug use: No  . Sexual activity: Not on file  Lifestyle  . Physical activity:    Days per week: Not on file    Minutes per session: Not on file  . Stress:  Not on file  Relationships  . Social connections:    Talks on phone: Not on file    Gets together: Not on file    Attends religious service: Not on file    Active member of club or organization: Not on file    Attends meetings of clubs or organizations: Not on file    Relationship status: Not on file  . Intimate partner violence:    Fear of current or ex partner: Not on file    Emotionally abused: Not on file    Physically abused: Not on file    Forced sexual activity: Not on file  Other Topics Concern  . Not on file  Social History Narrative   Retired Pharmacist, hospital, high school teacher taught mass. One son, helping to care for her granddaughter. No caffeine. Updated 07/20/2014.     Review of Systems    Review of Systems  Constitutional: Negative for chills, diaphoresis and fever.  HENT: Positive for congestion.   Eyes: Negative for blurred vision and double vision.  Respiratory: Positive for cough. Negative for hemoptysis and shortness of breath.   Cardiovascular: Positive for chest pain and palpitations. Negative for orthopnea, leg swelling and PND.  Gastrointestinal: Negative for abdominal pain, blood in stool, nausea and vomiting.   Genitourinary: Negative for hematuria.  Musculoskeletal: Positive for back pain.  Neurological: Positive for focal weakness (left side ). Negative for dizziness, tingling and loss of consciousness.  Endo/Heme/Allergies: Bruises/bleeds easily.  Psychiatric/Behavioral: Negative for substance abuse.    Physical Exam    Blood pressure (!) 160/64, pulse (!) 55, temperature 98.2 F (36.8 C), resp. rate 18, SpO2 100 %.  General: 67 y.o. obese African-American female resting comfortably in no acute distress. Pleasant and cooperative. HEENT: Normal  Neck: Supple. No carotid bruits. JVD difficult to assess due to body habitus. Lungs: No increased work of breathing. Clear to auscultation bilaterally. No wheezes, rhonchi, or rales. Heart: Irregular rhythm due to frequent ectopic beats. Regular rate. No murmurs, gallops, or rubs.  Abdomen: Soft, non-distended, and non-tender to palpation. Bowel sounds present. Extremities: 1+ pitting edema of bilateral lower extremities. Radial pulses 2+ and equal bilaterally.  Neuro: Alert and oriented x3. No focal deficits. Moves all extremities spontaneously. Psych: Normal affect.  Labs    Troponin [facility] of Care Test) Recent Labs    10/25/18 1115  TROPIPOC 0.00   No results for input(s): CKTOTAL, CKMB, TROPONINI in the last 72 hours. Lab Results  Component Value Date   WBC 4.2 10/25/2018   HGB 11.6 (L) 10/25/2018   HCT 38.5 10/25/2018   MCV 92.3 10/25/2018   PLT 243 10/25/2018    Recent Labs  Lab 10/25/18 1111  NA 135  K 3.7  CL 103  CO2 25  BUN 6*  CREATININE 1.01*  CALCIUM 8.8*  GLUCOSE 163*   Lab Results  Component Value Date   CHOL 107 10/14/2018   HDL 48 10/14/2018   LDLCALC 48 10/14/2018   TRIG 53 10/14/2018   No results found for: [facility]   Radiology Studies    Ct Angio Head W Or Wo Contrast  Result Date: 10/14/2018 CLINICAL DATA:  67 year old female code stroke presentation yesterday with left side deficits. EXAM: CT  ANGIOGRAPHY HEAD AND NECK TECHNIQUE: Multidetector CT imaging of the head and neck was performed using the standard protocol during bolus administration of intravenous contrast. Multiplanar CT image reconstructions and MIPs were obtained to evaluate the vascular anatomy. Carotid stenosis measurements (when applicable) are obtained  utilizing NASCET criteria, using the distal internal carotid diameter as the denominator. CONTRAST:  7mL ISOVUE-370 IOPAMIDOL (ISOVUE-370) INJECTION 76% COMPARISON:  Head CT without contrast 10/13/2018. CTA head and neck 08/29/2015 and 08/30/2015. FINDINGS: CTA NECK Skeleton: No acute osseous abnormality identified. Visualized paranasal sinuses and mastoids are stable and well pneumatized. Largely absent dentition. Upper chest: Negative upper lungs. No superior mediastinal lymphadenopathy. Other neck: Surgically absent thyroid as before. Otherwise negative. Aortic arch: Mild Calcified aortic atherosclerosis. Bovine type arch configuration. Stable great vessel origins since 2016 with no origin stenosis. Right carotid system: Stable mild tortuosity of the proximal right CCA. Minimal right CCA and right carotid bifurcation plaque with no right carotid stenosis in the neck. Mildly tortuous cervical right ICA. Left carotid system: Mild left CCA tortuosity. Chronic left CCA mostly calcified plaque just above the level of the thyroid with no stenosis, stable. Mild mostly calcified plaque at the left carotid bifurcation is stable with no stenosis. Vertebral arteries: No proximal right subclavian artery stenosis. Calcified plaque near the right vertebral artery origin, but no origin stenosis. Mildly tortuous right vertebral artery is patent to the skull base without stenosis. No proximal left subclavian artery stenosis despite soft and calcified plaque. Similar mild soft and calcified plaque near the left vertebral artery origin but no origin stenosis. Patent and occasionally tortuous left  vertebral artery to the skull base without stenosis. CTA HEAD Posterior circulation: Moderate to severe bilateral V4 segment calcified plaque is re- demonstrated with associated moderate to severe bilateral V4 segment stenosis distal to both PICA origins which remain patent (series 7, image 137 on the left and image 132 on the right). The distal V4 segments then remain normal. Patent vertebrobasilar junction and basilar artery without stenosis. SCA and PCA origins are patent and within normal limits. Small left posterior communicating artery is present. The right is diminutive or absent. Bilateral PCA branches are within normal limits. Anterior circulation: Moderate to severe bilateral ICA siphon calcified atherosclerosis redemonstrated. Both ophthalmic artery origins remain patent. Hemodynamically significant stenosis on the right suspected at the junction of the vertical petrous and cavernous segments, with moderate right supraclinoid the stenosis. These segments appears stable. On the left side hemodynamically significant siphon stenosis is suspected in the proximal and distal cavernous segment. The segments appears stable. Carotid termini remain patent. MCA and ACA origins are normal. Anterior communicating artery and bilateral ACA branches are stable and within normal limits. Left MCA M1 segment, bifurcation, and left MCA branches are stable and within normal limits. Right MCA M1 segment, bifurcation, and right MCA branches are stable and within normal limits. Venous sinuses: Patent. Anatomic variants: Bovine type aortic arch configuration. Delayed phase: Gray-white matter differentiation in the right hemisphere peers stable and within normal limits. No acute or evolving infarct is identified. No intracranial mass effect, ventriculomegaly, or hemorrhage identified. No abnormal enhancement identified. Review of the MIP images confirms the above findings IMPRESSION: 1. 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 There is comparatively little extracranial atherosclerosis, and no plaque or stenosis identified in the circle of Willis branches. 2. Stable CT appearance of the brain since yesterday. No acute or evolving infarct identified. Electronically Signed   By: Genevie Ann M.D.   On: 10/14/2018 09:27   Dg Chest 2 View  Result Date: 10/25/2018 CLINICAL DATA:  Chest pain and indigestion starting yesterday. EXAM:  CHEST - 2 VIEW COMPARISON:  10/14/2018 FINDINGS: Atherosclerotic calcification of the aortic arch. Thoracic spondylosis. Mild cardiomegaly. Upper zone pulmonary vascular prominence suggesting pulmonary venous hypertension. No overt edema. The lungs appear otherwise clear. No pleural effusion identified. IMPRESSION: 1. Mild enlargement of the cardiopericardial silhouette with pulmonary venous hypertension but no overt edema. Similar to prior. 2.  Aortic Atherosclerosis (ICD10-I70.0). Electronically Signed   By: Van Clines M.D.   On: 10/25/2018 11:54   Dg Chest 2 View  Result Date: 10/14/2018 CLINICAL DATA:  Stroke x last night around 830pm, left sided numbness from neck down to her feet, HTN, diabetic, nonsmoker EXAM: CHEST - 2 VIEW COMPARISON:  04/03/2016 FINDINGS: Central pulmonary vascular congestion. No confluent airspace disease. Moderate cardiomegaly stable.  Aortic Atherosclerosis (ICD10-170.0). No effusion. Osteopenia with stable mild midthoracic compression deformity. IMPRESSION: Stable cardiomegaly and central pulmonary vascular Electronically Signed   By: Lucrezia Europe M.D.   On: 10/14/2018 08:45   Ct Angio Neck W Or Wo Contrast  Result Date: 10/14/2018 CLINICAL DATA:  67 year old female code stroke presentation yesterday with left side deficits. EXAM: CT ANGIOGRAPHY HEAD AND NECK TECHNIQUE: Multidetector CT imaging of the head and neck was performed  using the standard protocol during bolus administration of intravenous contrast. Multiplanar CT image reconstructions and MIPs were obtained to evaluate the vascular anatomy. Carotid stenosis measurements (when applicable) are obtained utilizing NASCET criteria, using the distal internal carotid diameter as the denominator. CONTRAST:  33mL ISOVUE-370 IOPAMIDOL (ISOVUE-370) INJECTION 76% COMPARISON:  Head CT without contrast 10/13/2018. CTA head and neck 08/29/2015 and 08/30/2015. FINDINGS: CTA NECK Skeleton: No acute osseous abnormality identified. Visualized paranasal sinuses and mastoids are stable and well pneumatized. Largely absent dentition. Upper chest: Negative upper lungs. No superior mediastinal lymphadenopathy. Other neck: Surgically absent thyroid as before. Otherwise negative. Aortic arch: Mild Calcified aortic atherosclerosis. Bovine type arch configuration. Stable great vessel origins since 2016 with no origin stenosis. Right carotid system: Stable mild tortuosity of the proximal right CCA. Minimal right CCA and right carotid bifurcation plaque with no right carotid stenosis in the neck. Mildly tortuous cervical right ICA. Left carotid system: Mild left CCA tortuosity. Chronic left CCA mostly calcified plaque just above the level of the thyroid with no stenosis, stable. Mild mostly calcified plaque at the left carotid bifurcation is stable with no stenosis. Vertebral arteries: No proximal right subclavian artery stenosis. Calcified plaque near the right vertebral artery origin, but no origin stenosis. Mildly tortuous right vertebral artery is patent to the skull base without stenosis. No proximal left subclavian artery stenosis despite soft and calcified plaque. Similar mild soft and calcified plaque near the left vertebral artery origin but no origin stenosis. Patent and occasionally tortuous left vertebral artery to the skull base without stenosis. CTA HEAD Posterior circulation: Moderate to severe  bilateral V4 segment calcified plaque is re- demonstrated with associated moderate to severe bilateral V4 segment stenosis distal to both PICA origins which remain patent (series 7, image 137 on the left and image 132 on the right). The distal V4 segments then remain normal. Patent vertebrobasilar junction and basilar artery without stenosis. SCA and PCA origins are patent and within normal limits. Small left posterior communicating artery is present. The right is diminutive or absent. Bilateral PCA branches are within normal limits. Anterior circulation: Moderate to severe bilateral ICA siphon calcified atherosclerosis redemonstrated. Both ophthalmic artery origins remain patent. Hemodynamically significant stenosis on the right suspected at the junction of the vertical petrous and cavernous segments, with moderate  right supraclinoid the stenosis. These segments appears stable. On the left side hemodynamically significant siphon stenosis is suspected in the proximal and distal cavernous segment. The segments appears stable. Carotid termini remain patent. MCA and ACA origins are normal. Anterior communicating artery and bilateral ACA branches are stable and within normal limits. Left MCA M1 segment, bifurcation, and left MCA branches are stable and within normal limits. Right MCA M1 segment, bifurcation, and right MCA branches are stable and within normal limits. Venous sinuses: Patent. Anatomic variants: Bovine type aortic arch configuration. Delayed phase: Gray-white matter differentiation in the right hemisphere peers stable and within normal limits. No acute or evolving infarct is identified. No intracranial mass effect, ventriculomegaly, or hemorrhage identified. No abnormal enhancement identified. Review of the MIP images confirms the above findings IMPRESSION: 1. 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 There is comparatively little extracranial atherosclerosis, and no plaque or stenosis identified in the circle of Willis branches. 2. Stable CT appearance of the brain since yesterday. No acute or evolving infarct identified. Electronically Signed   By: Genevie Ann M.D.   On: 10/14/2018 09:27   Mr Brain Wo Contrast  Result Date: 10/14/2018 CLINICAL DATA:  Follow up stroke. History of stroke, hyperlipidemia, hypertension, diabetes and thyroid cancer. EXAM: MRI HEAD WITHOUT CONTRAST TECHNIQUE: Multiplanar, multiecho pulse sequences of the brain and surrounding structures were obtained without intravenous contrast. COMPARISON:  CT HEAD October 14, 2018 and MRI head August 29, 2015 FINDINGS: Multiple sequences are moderately motion degraded. INTRACRANIAL CONTENTS: 6 mm acute RIGHT pontine infarct at superior cerebellar peduncle with low ADC values. A few scattered chronic microhemorrhages noted. Patchy supratentorial white matter T2 hyperintensities. Old pontine lacunar infarcts. Prominent basal ganglia perivascular spaces associated with chronic small vessel ischemic changes. No parenchymal brain volume loss for age. No midline shift, mass effect or masses. No abnormal extra-axial fluid collections. Basal cisterns are patent. VASCULAR: Normal major intracranial vascular flow voids present at skull base. SKULL AND UPPER CERVICAL SPINE: No abnormal sellar expansion. No suspicious calvarial bone marrow signal. Craniocervical junction maintained. SINUSES/ORBITS: The mastoid air-cells and included paranasal sinuses are well-aerated.The included ocular globes and orbital contents are non-suspicious. Status post bilateral ocular lens implants. OTHER: Patient is edentulous. IMPRESSION: 1. Motion degraded examination. Acute subcentimeter RIGHT pontine infarct. 2. 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 Electronically Signed   By: Elon Alas M.D.    On: 10/14/2018 20:29   Mm 3d Screen Breast Bilateral  Result Date: 10/04/2018 CLINICAL DATA:  Screening. EXAM: DIGITAL SCREENING BILATERAL MAMMOGRAM WITH TOMO AND CAD COMPARISON:  Previous exam(s). ACR Breast Density Category b: There are scattered areas of fibroglandular density. FINDINGS: There are no findings suspicious for malignancy. Images were processed with CAD. IMPRESSION: No mammographic evidence of malignancy. A result letter of this screening mammogram will be mailed directly to the patient. RECOMMENDATION: Screening mammogram in one year. (Code:SM-B-01Y) BI-RADS CATEGORY  1: Negative. Electronically Signed   By: Lajean Manes M.D.   On: 10/04/2018 16:03   Ct Head Code Stroke Wo Contrast  Result Date: 10/13/2018 CLINICAL DATA:  Code stroke.  Left-sided weakness. EXAM: CT HEAD WITHOUT CONTRAST TECHNIQUE: Contiguous axial images were obtained from the base of the skull through the vertex without intravenous contrast. COMPARISON:  04/03/2016 FINDINGS: Brain: No evidence of acute infarction, hemorrhage, hydrocephalus, extra-axial collection or mass  lesion/mass effect. 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 Mild, age congruent cerebral volume loss. Vascular: Atherosclerotic calcification.  No hyperdense vessel. Skull: Normal. Negative for fracture or focal lesion. Sinuses/Orbits: No acute finding. Other: These results were communicated to Dr. Leonel Ramsay at 9:41 pmon 10/20/2019by text page via the [facility] messaging system. ASPECTS [facility] Stroke Program Early CT Score) - Ganglionic level infarction (caudate, lentiform nuclei, internal capsule, insula, M1-M3 cortex): 7 - Supraganglionic infarction (M4-M6 cortex): 3 Total score (0-10 with 10 being normal): 10 IMPRESSION: 1. No acute finding.  ASPECTS is 10. 2. 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 Electronically Signed   By: Monte Fantasia M.D.   On: 10/13/2018 21:43    EKG     EKG: EKG was personally reviewed and demonstrates: sinus rhythm,  rate 76 bpm, with PVCs in a bigeminy pattern and no significant ST/T changes  Telemetry: Telemetry was personally reviewed and demonstrates: sinus rhythm with frequent PVCs often in bigeminy pattern  Cardiac Imaging    TEE 10/16/2018: Study Conclusions: - Left ventricle: Systolic function was normal. The estimated   ejection fraction was in the range of 60% to 65%. Wall motion was   normal; there were no regional wall motion abnormalities. - Aortic valve: Trileaflet; mildly thickened, mildly calcified   leaflets. There was no significant regurgitation. - Mitral valve: There was trivial regurgitation. - Left atrium: No evidence of thrombus in the atrial cavity or   appendage. - Right atrium: No evidence of thrombus in the atrial cavity or   appendage. - Atrial septum: No defect or patent foramen ovale was identified.   Echo contrast study showed a trivial late right-to-left shunt, in   the baseline state. This likely represents non-cardiac shunt. - Tricuspid valve: There was mild regurgitation.  Impressions: - No cardiac source of emboli was indentified. _______________   TTE 10/14/2018: Study Conclusions: - Procedure narrative: Transthoracic echocardiography. Technically   difficult study. Intravenous contrast (Definity) was   administered. - Left ventricle: The cavity size was normal. There was moderate   concentric hypertrophy. Systolic function was normal. The   estimated ejection fraction was in the range of 60% to 65%. Wall   motion was normal; there were no regional wall motion   abnormalities. The study is not technically sufficient to allow   evaluation of LV diastolic function. - Left atrium: The atrium was normal in size. - Right atrium: The atrium was normal in size.  Impressions: - Technically difficult study. Afib is noted. LVEF 60-65%, moderate   LVH, normal wall motion, normal biatrial size. _______________  Left Heart Catheterization  05/14/2012: Impressions: 1. Normal coronary arteries, right dominant circulation. LVEF: 55% 2. Right heart cath revealing: Moderate pulmonary hypertension 3. No significant peripheral artery disease. Slow filling in the peripheral vessels.   Right Heart Catheterization: RA Pressure 15/14 Mean 12.mm Hg. RA Saturation NA RV: 45/9 EDP 13 PA: 41/18 with mean of 28 mm Hg.PA saturation 61% PCW: 21/25 with mean of 20 mm Hg. Aortic saturation:  95% Cardiac Output: 5.51, Cardiac Index: 2.46 by FICK method.   Assessment & Plan    1. Chest Pain - Patient presents with chest tightness/pressure that started at rest this morning 30 minutes after taking all of her medications. Patient has a history of GERD and states it feels like indigestion. Patient notes improvement of pain after she received Maalox/Mylanta. - EKG showed no acute ischemic changes. - Initial troponin negative. - Recent echo showed normal systolic function with EF of 60-65%  with no wall motion abnormalities. - Patient had a cardiac catheterization in 2013 which showed normal coronary arteries but moderate pulmonary hypertension. - Patient continues to have some mild chest pain but notes it has greatly improved since she took the Maalox/Mylanta.  - Pain is somewhat atypical given duration of pain, onset at rest, and improvement with Maalox/Mylanta; however, she does have multiple cardiovascular risk factors (HTN, HLD, T1DM, CVA, family history). Patient insists on going home which I think is reasonable. If patient continues to have chest pain, may consider further ischemic workup in the future.  2. Recent CVA - Patient was recently admitted from 10/13/2018 to 10/16/2018 for acute subcentimeter right pontine infarct. - Continue Aspirin, Plavix, and Lipitor as directed.  - There was some concern for possible atrial fibrillation during last admission. EP saw patient and reviewed telemetry and felt she had some atrial tachycardia with very  frequent atrial ectomy and some blocked PACs but no clear atrial fibrillation.  - Patient is scheduled for Event Monitor next week.  3. Chronic Diastolic Congestive Heart Failure - Most recent Echo showed LVEF of 60-65%. - Patient does have some mild pitting edema of lower extremities but does no appear significantly volume overloaded on exam.  - Continue Toprol and Losartan-HCTZ for blood pressure and heart rate control.   4. Hypertension - BP 193/66 upon arrival to the ED. Most recent BP improved but still elevated at 160/64. - Patient states she took her BP this morning after chest discomfort started and it was in the 140s/70s. - Continue home medications and continue to monitor.   5. GERD - Patient currently takes Zantac 150mg  twice daily at home but states this does not help. - Consider switching to PPI.    Signed, Darreld Mclean, PA-C 10/25/2018, 2:31 PM  For questions or updates, please contact   Please consult [email or web address] for contact info under Cardiology/STEMI.

## 2018-10-25 NOTE — ED Triage Notes (Addendum)
Pt presents with c/o chest discomfort and indigestion since this morning. She reports she had a stroke on Sunday and was just discharged 10/23. She has left side residual. Denies shortness of breath. Pt is alert and oriented. NAD at triage.

## 2018-10-27 ENCOUNTER — Encounter: Payer: Self-pay | Admitting: Family Medicine

## 2018-10-28 ENCOUNTER — Ambulatory Visit (INDEPENDENT_AMBULATORY_CARE_PROVIDER_SITE_OTHER): Payer: Medicare Other

## 2018-10-28 ENCOUNTER — Other Ambulatory Visit: Payer: Self-pay | Admitting: Medical

## 2018-10-28 DIAGNOSIS — I4891 Unspecified atrial fibrillation: Secondary | ICD-10-CM

## 2018-10-28 DIAGNOSIS — I639 Cerebral infarction, unspecified: Secondary | ICD-10-CM

## 2018-10-29 ENCOUNTER — Encounter: Payer: Self-pay | Admitting: Family Medicine

## 2018-10-29 ENCOUNTER — Ambulatory Visit: Payer: Medicare Other | Admitting: Family Medicine

## 2018-10-29 ENCOUNTER — Other Ambulatory Visit: Payer: Self-pay | Admitting: Family Medicine

## 2018-10-29 ENCOUNTER — Telehealth: Payer: Self-pay | Admitting: *Deleted

## 2018-10-29 ENCOUNTER — Other Ambulatory Visit: Payer: Self-pay

## 2018-10-29 ENCOUNTER — Ambulatory Visit: Payer: Medicare Other | Admitting: Licensed Clinical Social Worker

## 2018-10-29 VITALS — BP 140/78 | HR 67 | Temp 98.0°F | Ht 67.0 in | Wt 274.0 lb

## 2018-10-29 DIAGNOSIS — I69398 Other sequelae of cerebral infarction: Secondary | ICD-10-CM

## 2018-10-29 DIAGNOSIS — F39 Unspecified mood [affective] disorder: Secondary | ICD-10-CM | POA: Insufficient documentation

## 2018-10-29 DIAGNOSIS — F32A Depression, unspecified: Secondary | ICD-10-CM | POA: Insufficient documentation

## 2018-10-29 DIAGNOSIS — R350 Frequency of micturition: Secondary | ICD-10-CM | POA: Diagnosis not present

## 2018-10-29 DIAGNOSIS — R600 Localized edema: Secondary | ICD-10-CM

## 2018-10-29 DIAGNOSIS — F0631 Mood disorder due to known physiological condition with depressive features: Secondary | ICD-10-CM

## 2018-10-29 DIAGNOSIS — I639 Cerebral infarction, unspecified: Secondary | ICD-10-CM | POA: Diagnosis not present

## 2018-10-29 DIAGNOSIS — R609 Edema, unspecified: Secondary | ICD-10-CM

## 2018-10-29 DIAGNOSIS — K219 Gastro-esophageal reflux disease without esophagitis: Secondary | ICD-10-CM

## 2018-10-29 LAB — POCT URINALYSIS DIP (MANUAL ENTRY)
BILIRUBIN UA: NEGATIVE mg/dL
Bilirubin, UA: NEGATIVE
Blood, UA: NEGATIVE
Glucose, UA: NEGATIVE mg/dL
LEUKOCYTES UA: NEGATIVE
Nitrite, UA: NEGATIVE
PROTEIN UA: NEGATIVE mg/dL
Spec Grav, UA: <=1.005 — AB (ref 1.010–1.025)
Urobilinogen, UA: 0.2 E.U./dL
pH, UA: 6 (ref 5.0–8.0)

## 2018-10-29 MED ORDER — FLUOXETINE HCL 10 MG PO TABS: 10.0000 mg | ORAL_TABLET | Freq: Every day | ORAL | 3 refills | Status: DC

## 2018-10-29 MED ORDER — PANTOPRAZOLE SODIUM 40 MG PO TBEC: 40.0000 mg | DELAYED_RELEASE_TABLET | Freq: Every day | ORAL | 0 refills | Status: DC

## 2018-10-29 NOTE — Assessment & Plan Note (Signed)
Patient currently with loop recorder per cardiology to assess for atrial fibrillation.  Patient currently working with home health PT/OT in order to help with recent stroke.

## 2018-10-29 NOTE — Assessment & Plan Note (Signed)
PHQ 9 of 24 and gad 7 of 21 today in office.  Patient with multiple anxiety attacks reported.  Patient reports that she is having trouble adjusting to her recent stroke.  Patient constantly worried about her health and well-being.  Patient recently seen in ED. think the patient may benefit from frequent visits to Dr. for reassurance.  Patient also encouraged to use my chart in order to message with questions and concerns when they may arise.  Patient would like to start medication in order to help with this.  Patient previously has tried Paxil, however fluoxetine has good data for post stroke depression.  Will start patient on low-dose of fluoxetine 10 mg and follow-up in 2 weeks.  Patient also seen by behavioral health while in office and given deep breathing exercises.  Patient reassured and felt better prior to leaving office today.

## 2018-10-29 NOTE — Telephone Encounter (Addendum)
Lm for pt to call back re serious event  Sinus rhythm with run of V-tach atrial run/bigeminal PACs/ cy

## 2018-10-29 NOTE — Assessment & Plan Note (Signed)
Patient with bilateral 2+ pitting edema.  Will obtain BMP as patient had bumped creatinine at last BMP.  Will also obtain BNP as patient has no recent BNP.  However patient following closely with cardiology and currently has loop recorder.  Patient with recent TEE and TTE which showed normal EF.   - Will also obtain urine protein in order to determine if patient may have some sort of nephrotic syndrome given bump in creatinine.

## 2018-10-29 NOTE — Assessment & Plan Note (Signed)
Patient previously stable on Zantac.  Not a candidate for long-term PPI.  However as the symptoms are crippling to her will continue Protonix for short stent of 2 weeks.  Patient to also continue her Zantac until then.  Patient also instructed that she may try Gaviscon after meals for breakthrough acid reflux pain.  Believe that a large aspect of this could be due to her anxiety and depression from her recent stroke and causing worry with anxiety attacks.  Will start patient on SSRI which will likely also help with her symptoms.  Patient to follow-up in 2 weeks.

## 2018-10-29 NOTE — Patient Instructions (Addendum)
Thank you for coming to see me today. It was a pleasure! Today we talked about:   For you anxiety, I have sent fluoxetine (prozac) to your pharamacy. Please start taking this daily. You may not start to notice improvement for a few week,s but please let me know if you have any bad side effects, including thoughts of harming yourself or others.  For your acid reflux, please take zantac (ranitidine) twice daily, and also please start taking pantoprazole (protonix) once a day for 2 weeks at the same time as taking the ranitidine. Then you may also take gaviscon (over the counter) as needed for break-though acid.   You and Neoma Laming discussed diaphragmic breathing techniques.  Please try this 3 times a day.   We will call you with your results.   Please follow-up with me in  or as needed.  If you have any questions or concerns, please do not hesitate to call the office at [phone].  Take Care,   [name] Mekiah Wahler, DO

## 2018-10-29 NOTE — Assessment & Plan Note (Signed)
Patient reporting increased urinary frequency although she drinks her water patient is quite concerned that she may have a UTI on top of her other myriad of problems.  Will obtain UA with reflex culture today in order to determine if patient has urinary tract infection.  However doubt that patient has infection at this time.

## 2018-10-29 NOTE — BH Specialist Note (Signed)
Integrated Behavioral Health Initial Visit  MRN: [id number] Name: [name]  Session Start time: 3:00  Session End time: 3:20 Total time: 20 minutes Type of Service: [place] Completed.     Patient verbally consented to meet with [facility] Consultant about presenting concerns. SUBJECTIVE: [name] is a 67 y.o. female referred by Dr. Enid Derry for assistance with managing symptoms of anxiety and depression Report of symptoms: difficulty sleeping, feeling anxious, hopeless and down, extremely difficult with daily functions. ASSESSMENT: Patient is pleasant and engaged in conversation. currently experiencing symptoms of  Anxiety and depression which are exacerbated by recent health concerns with stroke. Patient is use to caring for sister and adopted daughter and not wanting them to care for her. She does not like feeling dependent on others. Patient has a strong faith and relays on her faith when she feels down.  Patient is open to taking medication to assist with managing her symptoms. PLAN / GOALS: Patient will: 1. Reduce symptoms of: anxiety and depression 2. Increase knowledge and/or ability of: coping skills, self-management skills and stress reduction  3. Relaxed breathing 3 times daily _______________________________________________________ Duration of CURRENT symptoms:several weeks Impact on function:difficutl with daily functions Risk of harm to self or others: no thoughts of self harm due to faith.  LIFE CONTEXT: Family and Social: lives with sister and adopted 41 yr old daughter, has strong christian faith  School/Work: retired Scientist, research (life sciences): none at this time Life Changes: recent stroke  INTERVENTION:  Mindfulness or Psychologist, educational and Supportive Counseling,  Psychoeducation   PHQ 9=21,indication of : severe depression.  GAD-7=24,indication of : severe anxiety.   Casimer Lanius,  [place]   [phone] 9:14 AM

## 2018-10-29 NOTE — Progress Notes (Signed)
Subjective:    Patient ID: [name], female    DOB: 1951-07-01, 67 y.o.   MRN: [id number]   CC: f/u stroke  HPI:  Recent CVA:  Patient has been having home health OT/PT coming to her house and helping her.  Patient has been walking well with Rollator.  However patient has had recent anxiety and depression related to her recent CVA.  Patient normally takes care of someone but this present has been having to take care of her and it is causing her a lot of more concerned.  Patient to follow-up with her endocrinologist later this month in order to have better control of her diabetes.  GERD: Patient reports that she was recently seen in the ED where she was given Protonix 20 mg.  Patient reports that she had a full work-up for her heart and this was all negative.  Patient currently has a loop recorder in place to determine if patient also has A. fib which could have caused her stroke.  Patient reports that she has been taking 40 mg of her Protonix as it has not helped her.  Patient denies ever having a EGD. Patient reports that she was previously well controlled on Zantac Zantac 150 however reports that the Protonix is no longer helping her.  Patient also reports that she has been trying Maalox which only helps for a little bit of time.  Patient reports chest tightness. Does not note any food that makes her symptoms worse.  Patient does report feeling bloated and having her throat burning at times.  Patient denies this being worse at night or in the morning.  Patient reports that she never lays down flat while she is sleeping due to back pain.  Increased urinary frequency: Patient reports that she is having increased urinary frequency.  Patient does report that she has been drinking more water.  Denies dysuria, denies vaginal itching or vaginal discharge.  Patient is worried that she may have some sort of urinary tract infection.  Patient would like to have her UA done  today.  Bilateral swollen legs: Patient reports that while she is in the ED she was told by the physician there to have her legs checked out for swelling.  Patient denies any chest pain.  We does report some shortness of breath.  Patient reports using 2 pillows at night which is new to her.  Patient with recent TTE as well as TEE.  This showed normal EF.  Patient has not noticed any swelling to her legs, does note however that these also used to be quite skinnier than they are now.  Smoking status reviewed  ROS: 10 point ROS is otherwise negative, except as mentioned in HPI  Patient Active Problem List   Diagnosis Date Noted  . 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 Urinary frequency 10/29/2018  . Abnormal echocardiogram   . Stroke (cerebrum) ([place]) 10/13/2018  . Frequent PVCs   . Uncontrolled type 1 diabetes mellitus ([place])   . BPPV (benign paroxysmal positional vertigo), right 06/13/2017  . Premature atrial beats 06/13/2017  . Snoring 07/31/2016  . Pre-ulcerative corn or callous 12/15/2015  . History of CHF (congestive heart failure) 10/29/2015  . Essential hypertension 10/29/2015  . Cerebrovascular accident (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 ([place]) 10/29/2015  . Thyroid activity decreased   . Intracranial vascular stenosis   . HLD (hyperlipidemia)   . Cerebral infarction due to thrombosis of basilar artery ([place])   .  CVA (cerebral vascular accident) ([place]) 08/29/2015  . Posterior tibial tendinitis of right leg 04/26/2014  . Atypical chest pain 10/06/2013  . Bilateral lower extremity edema 08/20/2013  . Morbid obesity ([place]) 05/29/2013  . Back pain without radiation 04/28/2013  . Nail dystrophy 01/10/2013  . Diabetes mellitus ([place]) 01/25/2012  . Hyperlipidemia 01/25/2012  . Hypothyroidism 01/25/2012  . Hypertension 01/25/2012  . GERD (gastroesophageal reflux disease) 01/25/2012     Objective:  BP 140/78   Pulse 67   Temp 98 F (36.7 C) (Oral)   Ht '5\' 7"'$  (1.702 m)    Wt 274 lb (124.3 kg)   SpO2 99%   BMI 42.91 kg/m  Vitals and nursing note reviewed  General: NAD, pleasant, walking with rolling walker Cardiac: RRR, normal heart sounds, no murmurs, loop recorder in place Respiratory: CTAB, normal effort Extremities: 2+ pitting edema BLLE to knee, no cyanosis. WWP. Skin: warm and dry, no rashes noted Neuro: alert and oriented, no focal deficits Psych: normal affect  GAD 7 : Generalized Anxiety Score 10/29/2018  Nervous, Anxious, on Edge 3  Control/stop worrying 3  Worry too much - different things 3  Trouble relaxing 3  Restless 3  Easily annoyed or irritable 3  Afraid - awful might happen 3  Total GAD 7 Score 21  Anxiety Difficulty Extremely difficult    Depression screen [facility] 2/9 10/29/2018 10/29/2018 06/07/2018  Decreased Interest 3 0 0  Down, Depressed, Hopeless 3 0 0  PHQ - 2 Score 6 0 0  Altered sleeping 3 - -  Tired, decreased energy 3 - -  Change in appetite 3 - -  Feeling bad or failure about yourself  3 - -  Trouble concentrating 3 - -  Moving slowly or fidgety/restless 3 - -  Suicidal thoughts 0 - -  PHQ-9 Score 24 - -  Difficult doing work/chores Somewhat difficult - -    Assessment & Plan:    CVA (cerebral vascular accident) Patient currently with loop recorder per cardiology to assess for atrial fibrillation.  Patient currently working with home health PT/OT in order to help with recent stroke.  GERD (gastroesophageal reflux disease) Patient previously stable on Zantac.  Not a candidate for long-term PPI.  However as the symptoms are crippling to her will continue Protonix for short stent of 2 weeks.  Patient to also continue her Zantac until then.  Patient also instructed that she may try Gaviscon after meals for breakthrough acid reflux pain.  Believe that a large aspect of this could be due to her anxiety and depression from her recent stroke and causing worry with anxiety attacks.  Will start patient on SSRI which will  likely also help with her symptoms.  Patient to follow-up in 2 weeks.  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  Patient with multiple anxiety attacks reported.  Patient reports that she is having trouble adjusting to her recent stroke.  Patient constantly worried about her health and well-being.  Patient recently seen in ED. think the patient may benefit from frequent visits to Dr. for reassurance.  Patient also encouraged to use my chart in order to message with questions and concerns when they may arise.  Patient would like to start medication in order to help with this.  Patient previously has tried Paxil, however fluoxetine has good data for post stroke depression.  Will start patient on low-dose of fluoxetine 10 mg and follow-up in 2 weeks.  Patient also seen by behavioral health while in office and given deep breathing exercises.  Patient reassured and felt better prior to leaving office today.  Bilateral lower extremity edema Patient with bilateral 2+ pitting edema.  Will obtain BMP as patient had bumped creatinine at last BMP.  Will also obtain BNP as patient has no recent BNP.  However patient following closely with cardiology and currently has loop recorder.  Patient with recent TEE and TTE which showed normal EF.   - Will also obtain urine protein in order to determine if patient may have some sort of nephrotic syndrome given bump in creatinine.  Urinary frequency Patient reporting increased urinary frequency although she drinks her water patient is quite concerned that she may have a UTI on top of her other myriad of problems.  Will obtain UA with reflex culture today in order to determine if patient has urinary tract infection.  However doubt that patient has infection at this time.   [name] Cerria Randhawa, DO Family Medicine Resident PGY-2

## 2018-10-29 NOTE — Telephone Encounter (Signed)
Per Dr Curt Bears continue to monitor and find out if pt is symptomatic awaiting return call from pt ./cy

## 2018-10-30 LAB — BASIC METABOLIC PANEL
BUN/Creatinine Ratio: 10 — ABNORMAL LOW (ref 12–28)
BUN: 9 mg/dL (ref 8–27)
CALCIUM: 9.4 mg/dL (ref 8.7–10.3)
CHLORIDE: 96 mmol/L (ref 96–106)
CO2: 24 mmol/L (ref 20–29)
Creatinine, Ser: 0.94 mg/dL (ref 0.57–1.00)
GFR calc Af Amer: 73 mL/min/{1.73_m2} (ref >59)
GFR calc non Af Amer: 63 mL/min/{1.73_m2} (ref >59)
GLUCOSE: 143 mg/dL — AB (ref 65–99)
POTASSIUM: 4.3 mmol/L (ref 3.5–5.2)
Sodium: 135 mmol/L (ref 134–144)

## 2018-10-30 LAB — BRAIN NATRIURETIC PEPTIDE: BNP: 30.7 pg/mL (ref 0.0–100.0)

## 2018-10-30 LAB — PROTEIN / CREATININE RATIO, URINE
Creatinine, Urine: 37.3 mg/dL
Protein, Ur: 4.6 mg/dL
Protein/Creat Ratio: 123 mg/g creat (ref 0–200)

## 2018-10-31 ENCOUNTER — Encounter: Payer: Self-pay | Admitting: Family Medicine

## 2018-10-31 NOTE — Telephone Encounter (Signed)
Left message to call back  

## 2018-10-31 NOTE — Telephone Encounter (Signed)
I spoke with pt regarding monitor report.  Pt states she did not feel anything abnormal on Monday afternoon.

## 2018-11-03 ENCOUNTER — Encounter: Payer: Self-pay | Admitting: Family Medicine

## 2018-11-04 ENCOUNTER — Other Ambulatory Visit: Payer: Self-pay | Admitting: Family Medicine

## 2018-11-04 ENCOUNTER — Encounter: Payer: Self-pay | Admitting: Family Medicine

## 2018-11-06 ENCOUNTER — Encounter: Payer: Self-pay | Admitting: Cardiology

## 2018-11-07 ENCOUNTER — Telehealth: Payer: Self-pay | Admitting: *Deleted

## 2018-11-07 NOTE — Telephone Encounter (Signed)
Manuela Schwartz from [facility] calling for OT verbal orders as follows:  2 time(s) weekly for 1 week(s)  You can leave verbal orders on confidential voicemail.  [place], Salome Spotted, CMA

## 2018-11-11 ENCOUNTER — Other Ambulatory Visit: Payer: Self-pay

## 2018-11-11 ENCOUNTER — Encounter: Payer: Self-pay | Admitting: Family Medicine

## 2018-11-11 ENCOUNTER — Other Ambulatory Visit: Payer: Self-pay | Admitting: Family Medicine

## 2018-11-11 ENCOUNTER — Telehealth: Payer: Self-pay | Admitting: Cardiology

## 2018-11-11 ENCOUNTER — Telehealth: Payer: Self-pay

## 2018-11-11 ENCOUNTER — Ambulatory Visit: Payer: Medicare Other | Admitting: Family Medicine

## 2018-11-11 VITALS — BP 138/62 | HR 84 | Temp 98.2°F | Wt 273.2 lb

## 2018-11-11 DIAGNOSIS — K219 Gastro-esophageal reflux disease without esophagitis: Secondary | ICD-10-CM

## 2018-11-11 DIAGNOSIS — I491 Atrial premature depolarization: Secondary | ICD-10-CM | POA: Diagnosis not present

## 2018-11-11 DIAGNOSIS — M549 Dorsalgia, unspecified: Secondary | ICD-10-CM

## 2018-11-11 MED ORDER — DICLOFENAC SODIUM 1 % TD GEL: 4.0000 g | Freq: Four times a day (QID) | TRANSDERMAL | 2 refills | Status: DC

## 2018-11-11 MED ORDER — PANTOPRAZOLE SODIUM 40 MG PO TBEC: 40.0000 mg | DELAYED_RELEASE_TABLET | Freq: Two times a day (BID) | ORAL | 1 refills | Status: DC

## 2018-11-11 NOTE — Assessment & Plan Note (Signed)
Patient reports still symptomatic on myriad of treatments.  We will continue current treatment plan although will increase Protonix to twice daily for short-term.  Patient reports that her anxiety and depression have improved after starting fluoxetine.  Concerned this may be related to high-dose aspirin patient started on after stroke however given that patient had stroke on Plavix alone, will need to continue aspirin. -Obtain CMP and lipase today in order to rule out another etiology -We will order RUQ ultrasound if labs indicate -Depending on results from labs patient will likely need referral to GI. -Normal colonoscopy in 2015, patient does not appear to have had an EGD in the past 

## 2018-11-11 NOTE — Progress Notes (Signed)
Subjective:    Patient ID: [name], female    DOB: August 01, 1951, 67 y.o.   MRN: [id number]   CC: Follow-up heartburn  HPI:  Heartburn: Patient reports that she has been having to use Gas-X 2 times per day, [place] on 3 times per day and tries this first before taking medications.  She has also been taking the Protonix once daily and Zantac twice a day.  Patient reports that her appetite has decreased.  She is worried about this that she has T1DM and when her sugars get low she has been drinking sugar with water.  Reports that her pain has not improved and at times it is quite intense.  Pain is located in epigastric region.  Patient also reports that she has been taking Maalox in order to help with her bowel movements.  ROS: Patient denies any blood in stool, dark stools, emesis  Smoking status reviewed  ROS: 10 point ROS is otherwise negative, except as mentioned in HPI  Patient Active Problem List   Diagnosis Date Noted  . 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 Urinary frequency 10/29/2018  . Abnormal echocardiogram   . Stroke (cerebrum) ([place]) 10/13/2018  . Frequent PVCs   . Uncontrolled type 1 diabetes mellitus ([place])   . BPPV (benign paroxysmal positional vertigo), right 06/13/2017  . Premature atrial beats 06/13/2017  . Snoring 07/31/2016  . Pre-ulcerative corn or callous 12/15/2015  . History of CHF (congestive heart failure) 10/29/2015  . Essential hypertension 10/29/2015  . Cerebrovascular accident (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 ([place]) 10/29/2015  . Thyroid activity decreased   . Intracranial vascular stenosis   . HLD (hyperlipidemia)   . Cerebral infarction due to thrombosis of basilar artery ([place])   . CVA (cerebral vascular accident) ([place]) 08/29/2015  . Posterior tibial tendinitis of right leg 04/26/2014  . Atypical chest pain 10/06/2013  . Morbid obesity ([place]) 05/29/2013  . Back pain without radiation 04/28/2013  . Nail dystrophy  01/10/2013  . Diabetes mellitus ([place]) 01/25/2012  . Hyperlipidemia 01/25/2012  . Hypothyroidism 01/25/2012  . Hypertension 01/25/2012  . GERD (gastroesophageal reflux disease) 01/25/2012     Objective:  BP 138/62   Pulse 84   Temp 98.2 F (36.8 C) (Oral)   Wt 273 lb 3.2 oz (123.9 kg)   SpO2 99%   BMI 42.79 kg/m  Vitals and nursing note reviewed  General: NAD, pleasant Cardiac: RRR, normal heart sounds, no murmurs Respiratory: CTAB, normal effort Abdomen: soft, tender to palpation in epigastric region, nondistended Extremities: no edema or cyanosis. WWP. Skin: warm and dry, no rashes noted Neuro: alert and oriented, no focal deficits Psych: normal affect  Depression screen [facility] 2/9 11/11/2018 11/11/2018 10/29/2018  Decreased Interest 0 0 3  Down, Depressed, Hopeless 1 0 3  PHQ - 2 Score 1 0 6  Altered sleeping 2 - 3  Tired, decreased energy 1 - 3  Change in appetite 3 - 3  Feeling bad or failure about yourself  1 - 3  Trouble concentrating 2 - 3  Moving slowly or fidgety/restless 0 - 3  Suicidal thoughts 0 - 0  PHQ-9 Score 10 - 24  Difficult doing work/chores Not difficult at all - Somewhat difficult  Some recent data might be hidden   GAD 7 : Generalized Anxiety Score 11/11/2018 10/29/2018  Nervous, Anxious, on Edge 2 3  Control/stop worrying 1 3  Worry too much - different things 1 3  Trouble relaxing 0 3  Restless 0 3  Easily annoyed or irritable 1 3  Afraid - awful might happen 1 3  Total GAD 7 Score 6 21  Anxiety Difficulty Not difficult at all Extremely difficult     Assessment & Plan:    GERD (gastroesophageal reflux disease) Patient reports still symptomatic on myriad of treatments.  We will continue current treatment plan although will increase Protonix to twice daily for short-term.  Patient reports that her anxiety and depression have improved after starting fluoxetine.  Concerned this may be related to high-dose aspirin patient started on after  stroke however given that patient had stroke on Plavix alone, will need to continue aspirin. -Obtain CMP and lipase today in order to rule out another etiology -We will order RUQ ultrasound if labs indicate -Depending on results from labs patient will likely need referral to GI. -Normal colonoscopy in 2015, patient does not appear to have had an EGD in the past  Back pain without radiation Patient to continue Tylenol 650 mg every 6 hours.  We will also give Voltaren gel for back pain.  Patient is more concerned at this time for her heartburn.    [name] Pernella Ackerley, DO Family Medicine Resident PGY-2

## 2018-11-11 NOTE — Telephone Encounter (Signed)
Left pt a message to call back. 

## 2018-11-11 NOTE — Telephone Encounter (Signed)
Spoke with pt. She states that on Sunday she went to church and forgot the monitor at home. Pt states that this past Thursday or Friday the monitor was shown that she was out of the cellular towers where she was at the time. Pt states she is feeling fine. Pt has not have any  symptoms since wearing the monitor. Pt states will keep the appointment with Dr. Curt Bears on 12/13.

## 2018-11-11 NOTE — Telephone Encounter (Signed)
New Message   Pt returning call for nurse about [employer]

## 2018-11-11 NOTE — Patient Instructions (Addendum)
Thank you for coming to see me today. It was a pleasure! Today we talked about:   Your heartburn.  We will call you with the lab results.  In the meantime, I have sent in a new prescription for you to take your Protonix twice daily. Stop taking the potassium pills.   For your back pain I have sent diclofenac gel to your pharmacy.  Please rub this on the area as needed.  Please limit your Tylenol to 1, 650 mg tablet every 6 hours.   Please follow-up with me in 6 weeks or sooner as needed.  If you have any questions or concerns, please do not hesitate to call the office at [phone].  Take Care,   [name] Ansar Skoda, DO

## 2018-11-11 NOTE — Telephone Encounter (Signed)
Fax received from [employer] stating patient had "Sinus Bradycardia w/ Run of V-Tach (6 beats)/PVCs (14)/Atrial Escape Beat/Couplet PVCs" on 11/09/18 at 6:39 AM. Attempted to contact patient but there was no answer. Left message for patient to call back. Patient has an appointment with Dr. Curt Bears on 12/13.   Reviewed with DOD. Patient to keep appointment with Dr. Curt Bears and needs potassium and magnesium checked. Patient just had a CMET and Lipase drawn with PCP this morning. Called and spoke to [place] in their lab who is going to add on a Magnesium.

## 2018-11-11 NOTE — Assessment & Plan Note (Signed)
Patient to continue Tylenol 650 mg every 6 hours.  We will also give Voltaren gel for back pain.  Patient is more concerned at this time for her heartburn.

## 2018-11-12 ENCOUNTER — Encounter: Payer: Self-pay | Admitting: Family Medicine

## 2018-11-12 LAB — COMPREHENSIVE METABOLIC PANEL
A/G RATIO: 1.4 (ref 1.2–2.2)
ALT: 22 IU/L (ref 0–32)
AST: 21 IU/L (ref 0–40)
Albumin: 3.8 g/dL (ref 3.6–4.8)
Alkaline Phosphatase: 87 IU/L (ref 39–117)
BILIRUBIN TOTAL: 0.4 mg/dL (ref 0.0–1.2)
BUN/Creatinine Ratio: 8 — ABNORMAL LOW (ref 12–28)
BUN: 8 mg/dL (ref 8–27)
CO2: 23 mmol/L (ref 20–29)
Calcium: 9.2 mg/dL (ref 8.7–10.3)
Chloride: 92 mmol/L — ABNORMAL LOW (ref 96–106)
Creatinine, Ser: 1.02 mg/dL — ABNORMAL HIGH (ref 0.57–1.00)
GFR calc non Af Amer: 57 mL/min/{1.73_m2} — ABNORMAL LOW (ref >59)
GFR, EST AFRICAN AMERICAN: 66 mL/min/{1.73_m2} (ref >59)
GLUCOSE: 294 mg/dL — AB (ref 65–99)
Globulin, Total: 2.7 g/dL (ref 1.5–4.5)
POTASSIUM: 4.5 mmol/L (ref 3.5–5.2)
Sodium: 129 mmol/L — ABNORMAL LOW (ref 134–144)
TOTAL PROTEIN: 6.5 g/dL (ref 6.0–8.5)

## 2018-11-12 LAB — MAGNESIUM: Magnesium: 2 mg/dL (ref 1.6–2.3)

## 2018-11-12 LAB — LIPASE: LIPASE: 4 U/L — AB (ref 14–72)

## 2018-11-13 ENCOUNTER — Encounter: Payer: Self-pay | Admitting: Family Medicine

## 2018-11-15 ENCOUNTER — Encounter: Payer: Self-pay | Admitting: Family Medicine

## 2018-11-16 ENCOUNTER — Encounter: Payer: Self-pay | Admitting: Family Medicine

## 2018-11-16 ENCOUNTER — Other Ambulatory Visit: Payer: Self-pay

## 2018-11-16 ENCOUNTER — Emergency Department (HOSPITAL_COMMUNITY): Payer: Medicare Other

## 2018-11-16 ENCOUNTER — Emergency Department (HOSPITAL_COMMUNITY)
Admission: EM | Admit: 2018-11-16 | Discharge: 2018-11-16 | Disposition: A | Discharge status: Home / Self Care | Payer: Medicare Other | Attending: Emergency Medicine | Admitting: Emergency Medicine

## 2018-11-16 ENCOUNTER — Encounter (HOSPITAL_COMMUNITY): Payer: Self-pay | Admitting: Emergency Medicine

## 2018-11-16 DIAGNOSIS — Z7902 Long term (current) use of antithrombotics/antiplatelets: Secondary | ICD-10-CM | POA: Insufficient documentation

## 2018-11-16 DIAGNOSIS — I110 Hypertensive heart disease with heart failure: Secondary | ICD-10-CM | POA: Diagnosis not present

## 2018-11-16 DIAGNOSIS — E109 Type 1 diabetes mellitus without complications: Secondary | ICD-10-CM | POA: Insufficient documentation

## 2018-11-16 DIAGNOSIS — Z8673 Personal history of transient ischemic attack (TIA), and cerebral infarction without residual deficits: Secondary | ICD-10-CM | POA: Diagnosis not present

## 2018-11-16 DIAGNOSIS — Z7982 Long term (current) use of aspirin: Secondary | ICD-10-CM | POA: Diagnosis not present

## 2018-11-16 DIAGNOSIS — R630 Anorexia: Secondary | ICD-10-CM | POA: Diagnosis not present

## 2018-11-16 DIAGNOSIS — Z79899 Other long term (current) drug therapy: Secondary | ICD-10-CM | POA: Insufficient documentation

## 2018-11-16 DIAGNOSIS — I509 Heart failure, unspecified: Secondary | ICD-10-CM | POA: Insufficient documentation

## 2018-11-16 DIAGNOSIS — R1030 Lower abdominal pain, unspecified: Secondary | ICD-10-CM | POA: Insufficient documentation

## 2018-11-16 DIAGNOSIS — E039 Hypothyroidism, unspecified: Secondary | ICD-10-CM | POA: Diagnosis not present

## 2018-11-16 DIAGNOSIS — R140 Abdominal distension (gaseous): Secondary | ICD-10-CM | POA: Diagnosis not present

## 2018-11-16 DIAGNOSIS — Z794 Long term (current) use of insulin: Secondary | ICD-10-CM | POA: Diagnosis not present

## 2018-11-16 DIAGNOSIS — R110 Nausea: Secondary | ICD-10-CM | POA: Insufficient documentation

## 2018-11-16 DIAGNOSIS — R1084 Generalized abdominal pain: Secondary | ICD-10-CM

## 2018-11-16 LAB — COMPREHENSIVE METABOLIC PANEL
ALBUMIN: 3.2 g/dL — AB (ref 3.5–5.0)
ALT: 21 U/L (ref 0–44)
AST: 22 U/L (ref 15–41)
Alkaline Phosphatase: 71 U/L (ref 38–126)
Anion gap: 12 (ref 5–15)
BILIRUBIN TOTAL: 0.5 mg/dL (ref 0.3–1.2)
BUN: <5 mg/dL — ABNORMAL LOW (ref 8–23)
CO2: 20 mmol/L — ABNORMAL LOW (ref 22–32)
CREATININE: 0.89 mg/dL (ref 0.44–1.00)
Calcium: 8.4 mg/dL — ABNORMAL LOW (ref 8.9–10.3)
Chloride: 93 mmol/L — ABNORMAL LOW (ref 98–111)
GFR calc Af Amer: >60 mL/min (ref >60)
Glucose, Bld: 201 mg/dL — ABNORMAL HIGH (ref 70–99)
POTASSIUM: 3.4 mmol/L — AB (ref 3.5–5.1)
Sodium: 125 mmol/L — ABNORMAL LOW (ref 135–145)
TOTAL PROTEIN: 6.4 g/dL — AB (ref 6.5–8.1)

## 2018-11-16 LAB — CBC
HCT: 36 % (ref 36.0–46.0)
HEMOGLOBIN: 11.4 g/dL — AB (ref 12.0–15.0)
MCH: 28.3 pg (ref 26.0–34.0)
MCHC: 31.7 g/dL (ref 30.0–36.0)
MCV: 89.3 fL (ref 80.0–100.0)
Platelets: 197 10*3/uL (ref 150–400)
RBC: 4.03 MIL/uL (ref 3.87–5.11)
RDW: 11.8 % (ref 11.5–15.5)
WBC: 3.6 10*3/uL — ABNORMAL LOW (ref 4.0–10.5)
nRBC: 0 % (ref 0.0–0.2)

## 2018-11-16 LAB — CBG MONITORING, ED: GLUCOSE-CAPILLARY: 204 mg/dL — AB (ref 70–99)

## 2018-11-16 LAB — URINALYSIS, ROUTINE W REFLEX MICROSCOPIC
BILIRUBIN URINE: NEGATIVE
Glucose, UA: NEGATIVE mg/dL
HGB URINE DIPSTICK: NEGATIVE
Ketones, ur: NEGATIVE mg/dL
Leukocytes, UA: NEGATIVE
Nitrite: NEGATIVE
PH: 7 (ref 5.0–8.0)
Protein, ur: NEGATIVE mg/dL
SPECIFIC GRAVITY, URINE: 1.019 (ref 1.005–1.030)

## 2018-11-16 LAB — LIPASE, BLOOD: Lipase: 18 U/L (ref 11–51)

## 2018-11-16 MED ORDER — SIMETHICONE 80 MG PO CHEW: 80.0000 mg | CHEWABLE_TABLET | Freq: Four times a day (QID) | ORAL | 0 refills | Status: DC | PRN

## 2018-11-16 MED ORDER — SODIUM CHLORIDE 0.9 % IV BOLUS
1000.0000 mL | Freq: Once | INTRAVENOUS | Status: AC
  Administered 2018-11-16: 1000 mL via INTRAVENOUS

## 2018-11-16 MED ORDER — POLYETHYLENE GLYCOL 3350 17 GM/SCOOP PO POWD: 17.0000 g | Freq: Two times a day (BID) | ORAL | 0 refills | Status: DC

## 2018-11-16 MED ORDER — IOHEXOL 300 MG/ML  SOLN
100.0000 mL | Freq: Once | INTRAMUSCULAR | Status: AC | PRN
  Administered 2018-11-16: 100 mL via INTRAVENOUS

## 2018-11-16 NOTE — Discharge Instructions (Addendum)
Please follow-up with you doctor.  You should call them Monday and make a follow-up appointment.

## 2018-11-16 NOTE — ED Provider Notes (Signed)
[facility] EMERGENCY DEPARTMENT Provider Note   CSN: [id number] Arrival date & time: 11/16/18  0125     History   Chief Complaint Chief Complaint  Patient presents with  . Abdominal Pain    HPI [name] Sally-Anne Wamble is a 67 y.o. female.  Patient with past medical history of recent stroke approximately 3 weeks ago, type 1 diabetes, on insulin pump, CHF, hypertension, hyperlipidemia, GERD, presents to the emergency department with a chief complaint of abdominal pain.  She reports pain in her lower abdomen x2 days.  He reports being unable to have a bowel movement today.  Reports that she is normally very regular.  She also feels bloated and gassy.  She states the pain is sharp.  She denies any fevers chills.  Denies any dysuria or hematuria.  She denies having taken anything for her symptoms.  The history is provided by the patient. No language interpreter was used.    Past Medical History:  Diagnosis Date  . Anemia   . Arthritis   . CHF (congestive heart failure) ([place])   . Depression   . GERD (gastroesophageal reflux disease)   . Hemorrhoids   . Hyperlipidemia   . Hypertension   . Hypothyroidism   . Seasonal allergies   . Stroke ([place])   . Thyroid cancer ([place])    per pt S/p Total Thyroidectomy with Radioactive Iodine Therapy  . Type 1 diabetes mellitus [facility])     Patient Active Problem List   Diagnosis Date Noted  . 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 Urinary frequency 10/29/2018  . Abnormal echocardiogram   . Stroke (cerebrum) ([place]) 10/13/2018  . Frequent PVCs   . Uncontrolled type 1 diabetes mellitus ([place])   . BPPV (benign paroxysmal positional vertigo), right 06/13/2017  . Premature atrial beats 06/13/2017  . Snoring 07/31/2016  . Pre-ulcerative corn or callous 12/15/2015  . History of CHF (congestive heart failure) 10/29/2015  . Essential hypertension 10/29/2015  . Cerebrovascular accident (CVA) due to thrombosis of basilar  artery ([place]) 10/29/2015  . Thyroid activity decreased   . Intracranial vascular stenosis   . HLD (hyperlipidemia)   . 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 CVA (cerebral vascular accident) ([place]) 08/29/2015  . Posterior tibial tendinitis of right leg 04/26/2014  . Atypical chest pain 10/06/2013  . Morbid obesity ([place]) 05/29/2013  . Back pain without radiation 04/28/2013  . Nail dystrophy 01/10/2013  . Diabetes mellitus ([place]) 01/25/2012  . Hyperlipidemia 01/25/2012  . Hypothyroidism 01/25/2012  . Hypertension 01/25/2012  . GERD (gastroesophageal reflux disease) 01/25/2012    Past Surgical History:  Procedure Laterality Date  . ABDOMINAL ANGIOGRAM N/A 05/14/2012   Procedure: ABDOMINAL ANGIOGRAM;  Surgeon: Laverda Page, MD;  Location[facility] CATH LAB;  Service: Cardiovascular;  Laterality: N/A;  . ABDOMINAL HYSTERECTOMY  1995   partial  . ANKLE FRACTURE SURGERY Right   . CARDIAC CATHETERIZATION     with coronary angiogram  . COLONOSCOPY N/A 09/09/2014   Procedure: COLONOSCOPY;  Surgeon: Gatha Mayer, MD;  Location: [facility];  Service: Endoscopy;  Laterality: N/A;  . LEFT AND RIGHT HEART CATHETERIZATION WITH CORONARY ANGIOGRAM N/A 05/14/2012   Procedure: LEFT AND RIGHT HEART CATHETERIZATION WITH CORONARY ANGIOGRAM;  Surgeon: Laverda Page, MD;  Location: [facility] CATH LAB;  Service: Cardiovascular;  Laterality: N/A;  . MINI METER   09/08/2014   ELECTRONIC INSULIN PUMP  . TEE WITHOUT CARDIOVERSION N/A 10/16/2018  Procedure: TRANSESOPHAGEAL ECHOCARDIOGRAM (TEE);  Surgeon: Buford Dresser, MD;  Location[facility] ENDOSCOPY;  Service: Cardiovascular;  Laterality: N/A;  . THYROIDECTOMY  2006     OB History   None      Home Medications    Prior to Admission medications   Medication Sig Start Date End Date Taking? Authorizing Provider  acetaminophen (TYLENOL) 500 MG tablet Take 1,000 mg by mouth as needed for mild pain.    [provider]    aspirin 325 MG tablet TAKE 1 TABLET BY MOUTH DAILY 11/04/18   Shirley, [name], DO  atorvastatin (LIPITOR) 80 MG tablet TAKE 1 TABLET BY MOUTH DAILY 10/17/18   Shirley, [name], DO  clopidogrel (PLAVIX) 75 MG tablet TAKE 1 TABLET BY MOUTH EVERY DAY Patient taking differently: Take 75 mg by mouth daily.  07/15/18   Shirley, [name], DO  clopidogrel (PLAVIX) 75 MG tablet Take 1 tablet (75 mg total) by mouth daily. Patient not taking: Reported on 10/25/2018 10/17/18   Guadalupe Dawn, MD  CRANBERRY PO Take 1 tablet by mouth daily.    [provider]  Dextromethorphan-guaiFENesin (CORICIDIN HBP CONGESTION/COUGH PO) Take 1 tablet by mouth 2 (two) times daily.    [provider]  diclofenac sodium (VOLTAREN) 1 % GEL Apply 4 g topically 4 (four) times daily. 11/11/18   Shirley, [name], DO  fexofenadine (ALLEGRA) 30 MG tablet Take 30 mg by mouth daily as needed (allergies).     [provider]  FLUoxetine (PROZAC) 10 MG tablet Take 1 tablet (10 mg total) by mouth daily. 10/29/18   Shirley, [name], DO  Insulin Human (INSULIN PUMP) SOLN Inject into the skin continuous. humalog insulin    [provider]  levothyroxine (SYNTHROID, LEVOTHROID) 175 MCG tablet Take 175 mcg by mouth daily. 06/06/17   [provider]  losartan-hydrochlorothiazide (HYZAAR) 100-12.5 MG tablet Take 1 tablet by mouth daily. 01/30/18   Guadalupe Dawn, MD  metoprolol succinate (TOPROL-XL) 50 MG 24 hr tablet TAKE 1 TABLET BY MOUTH DAILY 10/17/18   Shirley, [name], DO  multivitamin-lutein [facility]) CAPS capsule Take 1 capsule by mouth daily.    [provider]  pantoprazole (PROTONIX) 40 MG tablet TAKE 1 TABLET BY MOUTH TWICE DAILY, BEFORE MEALS 11/11/18   Shirley, [name], DO  ranitidine (ZANTAC) 150 MG tablet TAKE 1 TABLET BY MOUTH TWICE DAILY Patient taking differently: Take 150 mg by mouth 2 (two) times daily.  09/23/18   Shirley, [name], DO    Family History Family History   Problem Relation Age of Onset  . Heart disease Mother        MI in 108s  . Hyperlipidemia Mother   . Hypertension Mother   . Renal Disease Mother        insufficiency  . Early death Father        Head Injury at Work  . Hyperlipidemia Father   . Hypertension Father   . Stroke Son   . Diabetes Maternal Aunt   . Prostate cancer Paternal Uncle   . Heart disease Maternal Aunt   . Heart failure Maternal Grandfather        Died of "heart attack" in his 54s  . Heart disease Maternal Grandfather   . Breast cancer Neg Hx     Social History Social History   Tobacco Use  . Smoking status: Never Smoker  . Smokeless tobacco: Never Used  Substance Use Topics  . Alcohol use: No  . Drug use: No     Allergies  Patient has no known allergies.   Review of Systems Review of Systems  All other systems reviewed and are negative.    Physical Exam Updated Vital Signs BP (!) 134/57   Pulse 66   Temp (!) 97.5 F (36.4 C) (Oral)   Resp 16   SpO2 100%   Physical Exam  Constitutional: She is oriented to person, place, and time. She appears well-developed and well-nourished.  HENT:  Head: Normocephalic and atraumatic.  Eyes: Pupils are equal, round, and reactive to light. Conjunctivae and EOM are normal.  Neck: Normal range of motion. Neck supple.  Cardiovascular: Normal rate and regular rhythm. Exam reveals no gallop and no friction rub.  No murmur heard. Pulmonary/Chest: Effort normal and breath sounds normal. No respiratory distress. She has no wheezes. She has no rales. She exhibits no tenderness.  Abdominal: Soft. Bowel sounds are normal. She exhibits no distension and no mass. There is tenderness. There is no rebound and no guarding.  Bilateral lower abdominal tenderness  Musculoskeletal: Normal range of motion. She exhibits no edema or tenderness.  Neurological: She is alert and oriented to person, place, and time.  Skin: Skin is warm and dry.  Psychiatric: She has a  normal mood and affect. Her behavior is normal. Judgment and thought content normal.  Nursing note and vitals reviewed.    ED Treatments / Results  Labs (all labs ordered are listed, but only abnormal results are displayed) Labs Reviewed  COMPREHENSIVE METABOLIC PANEL - Abnormal; Notable for the following components:      Result Value   Sodium 125 (*)    Potassium 3.4 (*)    Chloride 93 (*)    CO2 20 (*)    Glucose, Bld 201 (*)    BUN <5 (*)    Calcium 8.4 (*)    Total Protein 6.4 (*)    Albumin 3.2 (*)    All other components within normal limits  CBC - Abnormal; Notable for the following components:   WBC 3.6 (*)    Hemoglobin 11.4 (*)    All other components within normal limits  URINALYSIS, ROUTINE W REFLEX MICROSCOPIC - Abnormal; Notable for the following components:   Color, Urine COLORLESS (*)    All other components within normal limits  CBG MONITORING, ED - Abnormal; Notable for the following components:   Glucose-Capillary 204 (*)    All other components within normal limits  LIPASE, BLOOD  SAMPLE TO BLOOD BANK    EKG EKG Interpretation  Date/Time:  Saturday November 16 2018 04:37:09 EST Ventricular Rate:  66 PR Interval:    QRS Duration: 103 QT Interval:  449 QTC Calculation: 471 R Axis:   17 Text Interpretation:  Sinus rhythm Ventricular trigeminy Nonspecific T abnormalities, diffuse leads Baseline wander in lead(s) V1 No significant change since last tracing Confirmed by Thayer Jew [phone]) on 11/16/2018 4:48:49 AM    Radiology Ct Abdomen Pelvis W Contrast  Result Date: 11/16/2018 CLINICAL DATA:  Generalized abdominal pain, nausea, and bloating for 2 days. EXAM: CT ABDOMEN AND PELVIS WITH CONTRAST TECHNIQUE: Multidetector CT imaging of the abdomen and pelvis was performed using the standard protocol following bolus administration of intravenous contrast. CONTRAST:  164mL OMNIPAQUE IOHEXOL 300 MG/ML  SOLN COMPARISON:  None. FINDINGS: Lower chest:  Mild dependent changes in the lung bases. Small subpleural nodules likely representing lymph nodes. Coronary artery calcifications. Hepatobiliary: No focal liver abnormality is seen. No gallstones, gallbladder wall thickening, or biliary dilatation. Pancreas: Unremarkable. No pancreatic ductal dilatation  or surrounding inflammatory changes. Spleen: Normal in size without focal abnormality. Adrenals/Urinary Tract: Adrenal glands are unremarkable. Kidneys are normal, without renal calculi, focal lesion, or hydronephrosis. Bladder is unremarkable. Stomach/Bowel: Stomach, small bowel, and colon are mostly decompressed. No wall thickening or inflammatory changes identified. Appendix is not identified. Vascular/Lymphatic: Aortic atherosclerosis. No enlarged abdominal or pelvic lymph nodes. Reproductive: Status post hysterectomy. No adnexal masses. Other: No abdominal wall hernia or abnormality. No abdominopelvic ascites. Musculoskeletal: Degenerative changes in the spine. Degenerative disc disease at multiple levels. Anterior compression of the L2 vertebra no prior comparison studies are available. There is cortical irregularity of the superior endplate suggesting that this may be acute. Degenerative changes in the hips. IMPRESSION: 1. No acute process demonstrated in the abdomen or pelvis. No evidence of bowel obstruction or inflammation. 2. Anterior compression of the L2 vertebra. There is cortical irregularity of the superior endplate suggesting that this may be acute. Aortic Atherosclerosis (ICD10-I70.0). Electronically Signed   By: Lucienne Capers M.D.   On: 11/16/2018 03:45    Procedures Procedures (including critical care time)  Medications Ordered in ED Medications - No data to display   Initial Impression / Assessment and Plan / ED Course  I have reviewed the triage vital signs and the nursing notes.  Pertinent labs & imaging results that were available during my care of the patient were reviewed by  me and considered in my medical decision making (see chart for details).     Patient with abdominal pain, nausea, and bloating x2 days.  States that she has not had a bowel movement today.  States she is normally very regular.  She reports some left lower abdominal pain.  She does have some faint tenderness there.  Patient has afebrile.  Vital signs are stable.  Will check CT of abdomen given tenderness.  Laboratory work-up remarkable for hyponatremia, hypokalemia, hypo-chloremia, likely secondary to mild dehydration.  Patient has not been eating or drinking very much.  She states that she has no appetite.  I have given her fluids for this.  Recommend and urged close follow-up with PCP.  Patient understands and agrees with plan.  CT abdomen shows no acute intra-abdominal process, but does show L2 compression fracture.  I advised patient of this, she states that she already knows about this and has some chronic back pain from this.  She denies any recent falls.  Patient discussed with Dr. Dina Rich, who agrees with plan.  Final Clinical Impressions(s) / ED Diagnoses   Final diagnoses:  Generalized abdominal pain    ED Discharge Orders         Ordered    polyethylene glycol powder (GLYCOLAX/MIRALAX) powder  2 times daily     11/16/18 0442    simethicone (GAS-X) 80 MG chewable tablet  Every 6 hours PRN     11/16/18 0443           Montine Circle, PA-C 11/16/18 0449    Horton, Barbette Hair, MD 11/16/18 [phone]

## 2018-11-16 NOTE — ED Notes (Signed)
Patient transported to CT 

## 2018-11-16 NOTE — ED Triage Notes (Addendum)
C/o generalized abd pain, nausea, and feeling bloated x 2 days.  Pt is a diabetic with insulin pump.  Denies vomiting.

## 2018-11-17 ENCOUNTER — Encounter: Payer: Self-pay | Admitting: Family Medicine

## 2018-11-18 ENCOUNTER — Other Ambulatory Visit: Payer: Self-pay | Admitting: Family Medicine

## 2018-11-18 ENCOUNTER — Telehealth: Payer: Self-pay | Admitting: Family Medicine

## 2018-11-18 DIAGNOSIS — K219 Gastro-esophageal reflux disease without esophagitis: Secondary | ICD-10-CM

## 2018-11-18 NOTE — Telephone Encounter (Signed)
Was this addressed? ,  Dawn, CMA  

## 2018-11-18 NOTE — Telephone Encounter (Signed)
Perfect!  Thanks! [name], Salome Spotted, CMA

## 2018-11-18 NOTE — Telephone Encounter (Signed)
Patients needs a call back asap to find out what the script for Magnesium is for? Before she picks it up

## 2018-11-18 NOTE — Telephone Encounter (Signed)
I called last week and left a voicemail. Thanks!

## 2018-11-18 NOTE — Telephone Encounter (Signed)
Patient has not been given script for magnesium, messaged her on mychart to let her know she does not need to take any magnesium.

## 2018-11-19 ENCOUNTER — Ambulatory Visit: Payer: Medicare Other | Admitting: Adult Health

## 2018-11-19 ENCOUNTER — Encounter: Payer: Self-pay | Admitting: Adult Health

## 2018-11-19 ENCOUNTER — Telehealth: Payer: Self-pay | Admitting: Cardiology

## 2018-11-19 VITALS — BP 142/71 | HR 50 | Ht 67.0 in | Wt 272.0 lb

## 2018-11-19 DIAGNOSIS — I69354 Hemiplegia and hemiparesis following cerebral infarction affecting left non-dominant side: Secondary | ICD-10-CM

## 2018-11-19 DIAGNOSIS — E785 Hyperlipidemia, unspecified: Secondary | ICD-10-CM

## 2018-11-19 DIAGNOSIS — Z794 Long term (current) use of insulin: Secondary | ICD-10-CM

## 2018-11-19 DIAGNOSIS — I10 Essential (primary) hypertension: Secondary | ICD-10-CM | POA: Diagnosis not present

## 2018-11-19 DIAGNOSIS — E1159 Type 2 diabetes mellitus with other circulatory complications: Secondary | ICD-10-CM

## 2018-11-19 DIAGNOSIS — I63511 Cerebral infarction due to unspecified occlusion or stenosis of right middle cerebral artery: Secondary | ICD-10-CM

## 2018-11-19 NOTE — Telephone Encounter (Signed)
1. Is this related to a heart monitor you are wearing?  (If the patient says no, please ask     if they are caling about ICD/pacemaker.) yes  2. What is your issue?? (If the patient is calling for results of the heart monitor this     message should be sent to nurse.)   Patient is calling about her Holter Monitor, she states she broke out in a rash and blisters and has taken the monitor off.  She wants to know what her next step is.     Please route to covering RN/CMA/RMA for results. Route to monitor technicians or your monitor tech representative for your site for any technical concerns

## 2018-11-19 NOTE — Telephone Encounter (Signed)
[name] review the existing monitor data and make adjustments at that time.

## 2018-11-19 NOTE — Telephone Encounter (Signed)
Spoke with patient who is complaining she has a rash and blisters from wearing the heart monitor.  She has already called the monitor company and was sent different patches.  These caused a rash and blisters as well.  She reports "I can not wear that thing anymore."  She states she called the company and they told her to return the monitor.  That is what she is going to do.  She is using neosporin on her rash.  Will forward to Dr Curt Bears and his nurse for their knowledge.

## 2018-11-19 NOTE — Progress Notes (Signed)
[facility] [address] [place]. [place] [address] [phone]       OFFICE FOLLOW UP NOTE  [name] Date of Birth:  Jan 29, 1951 Medical Record Number:  [id number]   Reason for Referral:  hospital stroke follow up  CHIEF COMPLAINT:  Chief Complaint  Patient presents with  . 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  She was previously followed in this office Dr. Erlinda Hong for prior stroke.  History obtained from patient and chart review. Reviewed all radiology images and labs personally.  [name] is a 67 y.o. female with history of HTN, HLD, prior stroke, CHF, type I DB, thyroid cancer presenting with L hemiparesis and L sided numbness.  CT head reviewed and was negative for acute infarct but did show old left pontine lacune infarct.  CTA head and neck stable since imaging in 2016 which showed high-grade stenosis bilateral ICA segments and bilateral distal left VA V4.  MRI brain reviewed and showed right pontine infarct along with small vessel disease and old pontine lacunar infarcts.  2D echo showed an EF of 60 to 65% without evidence of cardiac thrombus but did show atrial fibrillation.  As there is no clear evidence on telemetry monitor of atrial fibrillation, TEE performed which was unremarkable without evidence of thrombus or PFO.  Recommended to undergo 30-day event cardiac monitor to rule out atrial fibrillation as during 2D echo showed possible AF.  Recommended a 30-day cardiac event monitor does not demonstrate AF, loop recorder will be considered.  Patient was on Plavix PTA and recommended DAPT for 3 weeks and then single agent alone.  LDL 48 and recommended continuation of atorvastatin 40  mg daily.  A1c 9.8 and recommended tight glycemic control with close PCP follow-up for DM management.  HTN stable during admission recommended long-term goal normotensive range.  Therapy recommended home health PT/OT and was discharged in stable condition.  Patient is being seen today for hospital follow-up.  She did have cardiac monitor started on 10/28/2018 and will be completed on 11/27/2018. She continues to have left sided weakness with some improvement. She does endorse numbness and heaviness sensation in left leg. She does endorse muscle stiffness in left arm and neck but denies pain. She has recently completed home health PT/OT. She would like to continue outpatient therapy at our neuro rehab. She continues to use rollator walker when she is outside which she was using prior. She continues on asprin and plavix without side effects of bleeding or bruising. Recent episode of GERD symptoms and was told this could possibly be due to use of aspirin. Continues to take lipitor without side effects of myalgias. Blood pressure satisfactory 142/71.  No further concerns at this time.  Denies new or worsening stroke/TIA symptoms. On a side note, she does complain of left toe pain and swelling which per review of epic notes, she has reached out to her PCP in this regards and is recommended to be seen in office as this could be possibly related to gout or a different type of condition that may need to be worked up.  She states she has a difficult time scheduling too many appointments soon together as  she is unable to drive and does not want her family members missing too much work.  She does have an appointment with endocrinology on Monday and after that we will be being seen by cardiology for completion of 30-day cardiac monitor and then plans on scheduling appointment with PCP.    ROS:   14 system review of systems performed and negative with exception of diarrhea, constipation, feeling hot, joint swelling,  allergies, change in appetite  PMH:  Past Medical History:  Diagnosis Date  . Anemia   . Arthritis   . CHF (congestive heart failure) ([place])   . Depression   . GERD (gastroesophageal reflux disease)   . Hemorrhoids   . Hyperlipidemia   . Hypertension   . Hypothyroidism   . Seasonal allergies   . Stroke ([place])   . Thyroid cancer ([place])    per pt S/p Total Thyroidectomy with Radioactive Iodine Therapy  . Type 1 diabetes mellitus ([facility])     PSH:  Past Surgical History:  Procedure Laterality Date  . ABDOMINAL ANGIOGRAM N/A 05/14/2012   Procedure: ABDOMINAL ANGIOGRAM;  Surgeon: Laverda Page, MD;  Location: [facility] CATH LAB;  Service: Cardiovascular;  Laterality: N/A;  . ABDOMINAL HYSTERECTOMY  1995   partial  . ANKLE FRACTURE SURGERY Right   . CARDIAC CATHETERIZATION     with coronary angiogram  . COLONOSCOPY N/A 09/09/2014   Procedure: COLONOSCOPY;  Surgeon: Gatha Mayer, MD;  Location: [facility];  Service: Endoscopy;  Laterality: N/A;  . LEFT AND RIGHT HEART CATHETERIZATION WITH CORONARY ANGIOGRAM N/A 05/14/2012   Procedure: LEFT AND RIGHT HEART CATHETERIZATION WITH CORONARY ANGIOGRAM;  Surgeon: Laverda Page, MD;  Location: [facility] CATH LAB;  Service: Cardiovascular;  Laterality: N/A;  . MINI METER   09/08/2014   ELECTRONIC INSULIN PUMP  . TEE WITHOUT CARDIOVERSION N/A 10/16/2018   Procedure: TRANSESOPHAGEAL ECHOCARDIOGRAM (TEE);  Surgeon: Buford Dresser, MD;  Location: [facility] ENDOSCOPY;  Service: Cardiovascular;  Laterality: N/A;  . THYROIDECTOMY  2006    Social History:  Social History   Socioeconomic History  . Marital status: Divorced    Spouse name: Not on file  . Number of children: 1  . Years of education: Not on file  . Highest education level: Not on file  Occupational History  . Occupation: retired Tour manager  . Financial resource strain: Not on file  . Food insecurity:    Worry: Not on file    Inability: Not on file  . Transportation  needs:    Medical: Not on file    Non-medical: Not on file  Tobacco Use  . Smoking status: Never Smoker  . Smokeless tobacco: Never Used  Substance and Sexual Activity  . Alcohol use: No  . Drug use: No  . Sexual activity: Not on file  Lifestyle  . Physical activity:    Days per week: Not on file    Minutes per session: Not on file  . Stress: Not on file  Relationships  . Social connections:    Talks on phone: Not on file    Gets together: Not on file    Attends religious service: Not on file    Active member of club or organization: Not on file    Attends meetings of clubs or organizations: Not on file    Relationship status: Not on file  . Intimate partner violence:    Fear of current or ex partner: Not on file    Emotionally abused: Not on  file    Physically abused: Not on file    Forced sexual activity: Not on file  Other Topics Concern  . Not on file  Social History Narrative   Retired Pharmacist, hospital, high school teacher taught mass. One son, helping to care for her granddaughter. No caffeine. Updated 07/20/2014.    Family History:  Family History  Problem Relation Age of Onset  . Heart disease Mother        MI in 59s  . Hyperlipidemia Mother   . Hypertension Mother   . Renal Disease Mother        insufficiency  . Early death Father        Head Injury at Work  . Hyperlipidemia Father   . Hypertension Father   . Stroke Son   . Diabetes Maternal Aunt   . Prostate cancer Paternal Uncle   . Heart disease Maternal Aunt   . Heart failure Maternal Grandfather        Died of "heart attack" in his 65s  . Heart disease Maternal Grandfather   . Breast cancer Neg Hx     Medications:   Current Outpatient Medications on File Prior to Visit  Medication Sig Dispense Refill  . acetaminophen (TYLENOL) 500 MG tablet Take 1,000 mg by mouth as needed for mild pain.    Marland Kitchen aspirin 325 MG tablet TAKE 1 TABLET BY MOUTH DAILY (Patient taking differently: Take 325 mg by mouth daily. )  90 tablet 2  . atorvastatin (LIPITOR) 80 MG tablet TAKE 1 TABLET BY MOUTH DAILY 90 tablet 0  . clopidogrel (PLAVIX) 75 MG tablet TAKE 1 TABLET BY MOUTH EVERY DAY (Patient taking differently: Take 75 mg by mouth daily. ) 90 tablet 0  . clopidogrel (PLAVIX) 75 MG tablet Take 1 tablet (75 mg total) by mouth daily. 30 tablet 0  . CRANBERRY PO Take 1 tablet by mouth daily.    Marland Kitchen Dextromethorphan-guaiFENesin (CORICIDIN HBP CONGESTION/COUGH PO) Take 1 tablet by mouth 2 (two) times daily.    . diclofenac sodium (VOLTAREN) 1 % GEL Apply 4 g topically 4 (four) times daily. 100 g 2  . fexofenadine (ALLEGRA) 30 MG tablet Take 30 mg by mouth daily as needed (allergies).     Marland Kitchen FLUoxetine (PROZAC) 10 MG tablet Take 1 tablet (10 mg total) by mouth daily. 30 tablet 3  . Insulin Human (INSULIN PUMP) SOLN Inject into the skin continuous. humalog insulin    . levothyroxine (SYNTHROID, LEVOTHROID) 175 MCG tablet Take 175 mcg by mouth daily.  12  . losartan-hydrochlorothiazide (HYZAAR) 100-12.5 MG tablet Take 1 tablet by mouth daily. 90 tablet 0  . metoprolol succinate (TOPROL-XL) 50 MG 24 hr tablet TAKE 1 TABLET BY MOUTH DAILY 90 tablet 0  . multivitamin-lutein (OCUVITE-LUTEIN) CAPS capsule Take 1 capsule by mouth daily.    . pantoprazole (PROTONIX) 40 MG tablet TAKE 1 TABLET BY MOUTH TWICE DAILY, BEFORE MEALS 180 tablet 1  . polyethylene glycol powder (GLYCOLAX/MIRALAX) powder Take 17 g by mouth 2 (two) times daily. 255 g 0  . ranitidine (ZANTAC) 150 MG tablet TAKE 1 TABLET BY MOUTH TWICE DAILY (Patient taking differently: Take 150 mg by mouth 2 (two) times daily. ) 180 tablet 3  . simethicone (GAS-X) 80 MG chewable tablet Chew 1 tablet (80 mg total) by mouth every 6 (six) hours as needed for flatulence. 30 tablet 0   No current facility-administered medications on file prior to visit.     Allergies:  No Known Allergies  Physical Exam  Vitals:   11/19/18 0859  BP: (!) 142/71  Pulse: (!) 50  Weight: 272 lb  (123.4 kg)  Height: 5\' 7"  (1.702 m)   Body mass index is 42.6 kg/m. No exam data present  General: Pleasant obese middle-aged African-American female, seated, in no evident distress Head: head normocephalic and atraumatic.   Neck: supple with no carotid or supraclavicular bruits Cardiovascular: regular rate and rhythm, no murmurs Musculoskeletal: no deformity Skin:  no rash/petichiae Vascular:  Normal pulses all extremities  Neurologic Exam Mental Status: Awake and fully alert. Oriented to place and time. Recent and remote memory intact. Attention span, concentration and fund of knowledge appropriate. Mood and affect appropriate.  Cranial Nerves: Fundoscopic exam reveals sharp disc margins. Pupils equal, briskly reactive to light. Extraocular movements full without nystagmus. Visual fields full to confrontation. Hearing intact. Facial sensation intact. Face, tongue, palate moves normally and symmetrically.  Motor: Normal bulk and tone. LUE: 4/5 proximal; LLE: 4+/5; no spacticity noted  Sensory.: decreased sensation left upper extremity proximal and left lower extremity Coordination: Rapid alternating movements normal in all extremities. Finger-to-nose and heel-to-shin performed accurately bilaterally. Gait and Station: Arises from chair without difficulty. Stance is normal. Gait demonstrates normal stride length and balance with use of Rollator walker.  Reflexes: 1+ and symmetric. Toes downgoing.    NIHSS  0 Modified Rankin  2    Diagnostic Data (Labs, Imaging, Testing)  CT HEAD WO CONTRAST 10/13/2018 IMPRESSION: 1. No acute finding.  ASPECTS is 10. 2. 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  CT ANGIO HEAD W OR WO CONTRAST CT ANGIO NECK W OR WO CONTRAST 10/14/2018 IMPRESSION: 1. 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 There is  comparatively little extracranial atherosclerosis, and no plaque or stenosis identified in the circle of Willis branches. 2. Stable CT appearance of the brain since yesterday. No acute or evolving infarct identified.  MR BRAIN WO CONTRAST 10/14/2018 IMPRESSION: 1. Motion degraded examination. Acute subcentimeter RIGHT pontine infarct. 2. 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  ECHOCARDIOGRAM 10/14/2018 Impressions: - Technically difficult study. Afib is noted. LVEF 60-65%, moderate   LVH, normal wall motion, normal biatrial size.  Echo TEE 10/16/2018 Study Conclusions - Left ventricle: Systolic function was normal. The estimated   ejection fraction was in the range of 60% to 65%. Wall motion was   normal; there were no regional wall motion abnormalities. - Aortic valve: Trileaflet; mildly thickened, mildly calcified   leaflets. There was no significant regurgitation. - Mitral valve: There was trivial regurgitation. - Left atrium: No evidence of thrombus in the atrial cavity or   appendage. - Right atrium: No evidence of thrombus in the atrial cavity or   appendage. - Atrial septum: No defect or patent foramen ovale was identified.   Echo contrast study showed a trivial late right-to-left shunt, in   the baseline state. This likely represents non-cardiac shunt. - Tricuspid valve: There was mild regurgitation.   ASSESSMENT: [name] is a 67 y.o. year old female here with right pontine infarct on 10/13/2018 secondary to small vessel disease. Vascular risk factors include HTN, HLD, prior stroke, CHF, DM and thyroid cancer.  Patient returns today for hospital follow-up and does continue to have left hemiparesis but has been improving and denies any new or worsening stroke/TIA symptoms.  PLAN:  1. Right pontine infarct: Continue clopidogrel 75 mg daily  and atorvastatin 80 mg for secondary stroke prevention.  Advised patient  to discontinue aspirin 325 mg at this time as 3-week DAPT completed and will continue on Plavix due to possible stomach sensitivities with use of aspirin.  Maintain strict control of hypertension with blood pressure goal below 130/90, diabetes with hemoglobin A1c goal below 6.5% and cholesterol with LDL cholesterol (bad cholesterol) goal below 70 mg/dL.  I also advised the patient to eat a healthy diet with plenty of whole grains, cereals, fruits and vegetables, exercise regularly with at least 30 minutes of continuous activity daily and maintain ideal body weight. 2. Left hemiparesis: Referral placed to outpatient PT/OT at neuro rehab clinic.  As far as stiffness concerns, is recommended to ensure she continues range of motion exercises along with exercises recommended by home health therapies and advised her that this should improve over time 3. HTN: Advised to continue current treatment regimen.  Today's BP 142/71.  Advised to continue to monitor at home along with continued follow-up with PCP for management 4. HLD: Advised to continue current treatment regimen along with continued follow-up with PCP for future prescribing and monitoring of lipid panel 5. DMII: Advised to continue to monitor glucose levels at home along with continued follow-up with PCP for management and monitoring 6. Possible atrial fibrillation: Complete 30-day cardiac event monitor on 11/27/2018 to rule out atrial fibrillation and it was recommended to consider loop recorder if atrial fibrillation not caught during 30day monitoring    Follow up in 3 months or call earlier if needed   Greater than 50% of time during this 25 minute visit was spent on counseling, explanation of diagnosis of right pontine infarct, reviewing risk factor management of HTN, HLD, prior infarct, CHF and DM, planning of further management along with potential future management, and discussion with patient and family answering all questions.    Venancio Poisson, AGNP-BC  [facility] [address] [place], [place] [address]  Phone [phone] Fax [phone] Note: This document was prepared with digital dictation and possible smart phrase technology. Any transcriptional errors that result from this process are unintentional.

## 2018-11-19 NOTE — Patient Instructions (Addendum)
Continue clopidogrel 75 mg daily  and lipitor 80mg  for secondary stroke prevention  Stop aspirin at this time and continue plavix alone   Continue to follow up with PCP regarding cholesterol, diabetes and blood pressure management along with concerns of left toe pain/swelling and hot flashes  Continue to have 30 day cardiac monitor recording heart rhythm for potential atrial fibrillation  If you do not hear from therapy by mid next week, please call office to schedule appointment at [phone]  Continue to monitor blood pressure at home  Maintain strict control of hypertension with blood pressure goal below 130/90, diabetes with hemoglobin A1c goal below 6.5% and cholesterol with LDL cholesterol (bad cholesterol) goal below 70 mg/dL. I also advised the patient to eat a healthy diet with plenty of whole grains, cereals, fruits and vegetables, exercise regularly and maintain ideal body weight.  Followup in the future with me in 3 months or call earlier if needed       Thank you for coming to see [place] at [facility]. I hope we have been able to provide you high quality care today.  You may receive a patient satisfaction survey over the next few weeks. We would appreciate your feedback and comments so that we may continue to improve ourselves and the health of our patients.

## 2018-11-19 NOTE — Progress Notes (Signed)
I agree with the above plan 

## 2018-11-25 ENCOUNTER — Encounter (HOSPITAL_COMMUNITY): Payer: Self-pay | Admitting: Emergency Medicine

## 2018-11-25 ENCOUNTER — Emergency Department (HOSPITAL_COMMUNITY)
Admission: EM | Admit: 2018-11-25 | Discharge: 2018-11-25 | Disposition: A | Discharge status: Home / Self Care | Payer: Medicare Other | Attending: Emergency Medicine | Admitting: Emergency Medicine

## 2018-11-25 ENCOUNTER — Encounter: Payer: Self-pay | Admitting: Family Medicine

## 2018-11-25 ENCOUNTER — Emergency Department (HOSPITAL_COMMUNITY): Payer: Medicare Other

## 2018-11-25 DIAGNOSIS — K219 Gastro-esophageal reflux disease without esophagitis: Secondary | ICD-10-CM | POA: Diagnosis not present

## 2018-11-25 DIAGNOSIS — Z79899 Other long term (current) drug therapy: Secondary | ICD-10-CM | POA: Insufficient documentation

## 2018-11-25 DIAGNOSIS — R0789 Other chest pain: Secondary | ICD-10-CM | POA: Diagnosis present

## 2018-11-25 DIAGNOSIS — E109 Type 1 diabetes mellitus without complications: Secondary | ICD-10-CM | POA: Insufficient documentation

## 2018-11-25 DIAGNOSIS — R079 Chest pain, unspecified: Secondary | ICD-10-CM

## 2018-11-25 DIAGNOSIS — I509 Heart failure, unspecified: Secondary | ICD-10-CM | POA: Insufficient documentation

## 2018-11-25 DIAGNOSIS — I10 Essential (primary) hypertension: Secondary | ICD-10-CM | POA: Diagnosis not present

## 2018-11-25 DIAGNOSIS — R0602 Shortness of breath: Secondary | ICD-10-CM | POA: Insufficient documentation

## 2018-11-25 DIAGNOSIS — Z85850 Personal history of malignant neoplasm of thyroid: Secondary | ICD-10-CM | POA: Insufficient documentation

## 2018-11-25 LAB — BASIC METABOLIC PANEL
ANION GAP: 12 (ref 5–15)
BUN: 6 mg/dL — ABNORMAL LOW (ref 8–23)
CHLORIDE: 93 mmol/L — AB (ref 98–111)
CO2: 20 mmol/L — AB (ref 22–32)
Calcium: 8.8 mg/dL — ABNORMAL LOW (ref 8.9–10.3)
Creatinine, Ser: 0.96 mg/dL (ref 0.44–1.00)
GFR calc non Af Amer: >60 mL/min (ref >60)
Glucose, Bld: 184 mg/dL — ABNORMAL HIGH (ref 70–99)
Potassium: 3.4 mmol/L — ABNORMAL LOW (ref 3.5–5.1)
Sodium: 125 mmol/L — ABNORMAL LOW (ref 135–145)

## 2018-11-25 LAB — CBC
HCT: 36.7 % (ref 36.0–46.0)
Hemoglobin: 11.8 g/dL — ABNORMAL LOW (ref 12.0–15.0)
MCH: 27.9 pg (ref 26.0–34.0)
MCHC: 32.2 g/dL (ref 30.0–36.0)
MCV: 86.8 fL (ref 80.0–100.0)
PLATELETS: 266 10*3/uL (ref 150–400)
RBC: 4.23 MIL/uL (ref 3.87–5.11)
RDW: 11.9 % (ref 11.5–15.5)
WBC: 4.1 10*3/uL (ref 4.0–10.5)
nRBC: 0 % (ref 0.0–0.2)

## 2018-11-25 LAB — I-STAT TROPONIN, ED
TROPONIN I, POC: 0.01 ng/mL (ref 0.00–0.08)
Troponin i, poc: 0 ng/mL (ref 0.00–0.08)

## 2018-11-25 LAB — TSH: TSH: 2.479 u[IU]/mL (ref 0.350–4.500)

## 2018-11-25 MED ORDER — FAMOTIDINE 20 MG PO TABS: 20.0000 mg | ORAL_TABLET | Freq: Two times a day (BID) | ORAL | 0 refills | Status: DC

## 2018-11-25 MED ORDER — ALUM & MAG HYDROXIDE-SIMETH 200-200-20 MG/5ML PO SUSP: 15.0000 mL | Freq: Four times a day (QID) | ORAL | 0 refills | Status: DC | PRN

## 2018-11-25 MED ORDER — FAMOTIDINE IN NACL 20-0.9 MG/50ML-% IV SOLN
20.0000 mg | INTRAVENOUS | Status: AC
  Administered 2018-11-25: 20 mg via INTRAVENOUS
  Filled 2018-11-25: qty 50

## 2018-11-25 MED ORDER — LIDOCAINE VISCOUS HCL 2 % MT SOLN
15.0000 mL | Freq: Once | OROMUCOSAL | Status: AC
  Administered 2018-11-25: 15 mL via ORAL
  Filled 2018-11-25: qty 15

## 2018-11-25 MED ORDER — ALUM & MAG HYDROXIDE-SIMETH 200-200-20 MG/5ML PO SUSP
30.0000 mL | Freq: Once | ORAL | Status: AC
  Administered 2018-11-25: 30 mL via ORAL
  Filled 2018-11-25: qty 30

## 2018-11-25 NOTE — ED Triage Notes (Signed)
Brought by ems from home for c/o central chest pressure.  Hx of the same.  Waiting for appointment with GI.  Taking multiple medications for acid reflux.  Reports waking up feeling hot so she drank water then had to vomit.  Reports not being able to catch her breath afterwards.

## 2018-11-25 NOTE — ED Notes (Signed)
Nurse will draw labs from IV 

## 2018-11-25 NOTE — ED Notes (Signed)
Delay in lab draw edp at bedside.

## 2018-11-25 NOTE — Discharge Instructions (Addendum)
Take the maalox every 6 hours when needed.  If you find the protonix is still not helping, can stop this and switch over to pepcid. Follow-up with Dr. Carlean Purl-- call today for appt. Return to the ED for new or worsening symptoms.

## 2018-11-25 NOTE — ED Provider Notes (Signed)
[facility]Y DEPARTMENT Provider Note   CSN: [id number] Arrival date & time: 11/25/18  0059     History   Chief Complaint Chief Complaint  Patient presents with  . Chest Pain  . Shortness of Breath    HPI [name] [name] is a 67 y.o. female.  The history is provided by the patient and medical records.    67 year old female with history of anemia, arthritis, congestive heart failure, depression, acid reflux, hyperlipidemia, hypertension, hypothyroidism, prior stroke, type 1 diabetes, presenting to the ED with chest pain and shortness of breath.  States she woke up from sleep and felt that she was sweating, reports she is done this quite frequently recently.  States she tried to drink some water and then vomited.  States for the past week she has been having issues with chest pain or shortness of breath.  She describes it as a "gas bubble" sitting in her upper chest.  States the pain and discomfort often "takes her breath away".  She denies any cough, wheezing, fever, nasal congestion, sore throat, or other upper respiratory symptoms.  She was seen by her primary care doctor and started on Protonix as well as Gas-X and has been taking Gaviscon without any relief.  She reports "it is not my heart, I have had all of those tests done already".  States her primary care doctor is planning to set her up with GI but appointment has not yet been scheduled.  Patient also is concerned about sleeping more than normal lately, she is also noticed a little bit of weight gain.  She does have history of hypothyroidism, states it is about time to have these levels checked again.  She has been compliant with her Synthroid.  Past Medical History:  Diagnosis Date  . Anemia   . Arthritis   . CHF (congestive heart failure) ([place])   . Depression   . GERD (gastroesophageal reflux disease)   . Hemorrhoids   . Hyperlipidemia   . Hypertension   . Hypothyroidism   . Seasonal  allergies   . Stroke ([place])   . Thyroid cancer ([place])    per pt S/p Total Thyroidectomy with Radioactive Iodine Therapy  . Type 1 diabetes mellitus [facility])     Patient Active Problem List   Diagnosis Date Noted  . 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 Urinary frequency 10/29/2018  . Abnormal echocardiogram   . Stroke (cerebrum) ([place]) 10/13/2018  . Frequent PVCs   . Uncontrolled type 1 diabetes mellitus ([place])   . BPPV (benign paroxysmal positional vertigo), right 06/13/2017  . Premature atrial beats 06/13/2017  . Snoring 07/31/2016  . Pre-ulcerative corn or callous 12/15/2015  . History of CHF (congestive heart failure) 10/29/2015  . Essential hypertension 10/29/2015  . 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 Thyroid activity decreased   . Intracranial vascular stenosis   . HLD (hyperlipidemia)   . Cerebral infarction due to thrombosis of basilar artery ([place])   . CVA (cerebral vascular accident) ([place]) 08/29/2015  . Posterior tibial tendinitis of right leg 04/26/2014  . Atypical chest pain 10/06/2013  . Morbid obesity ([place]) 05/29/2013  . Back pain without radiation 04/28/2013  . Nail dystrophy 01/10/2013  . Diabetes mellitus ([place]) 01/25/2012  . Hyperlipidemia 01/25/2012  . Hypothyroidism 01/25/2012  . Hypertension 01/25/2012  . GERD (gastroesophageal reflux disease) 01/25/2012    Past Surgical History:  Procedure Laterality Date  . ABDOMINAL ANGIOGRAM  N/A 05/14/2012   Procedure: ABDOMINAL ANGIOGRAM;  Surgeon: Laverda Page, MD;  Location: [facility] CATH LAB;  Service: Cardiovascular;  Laterality: N/A;  . ABDOMINAL HYSTERECTOMY  1995   partial  . ANKLE FRACTURE SURGERY Right   . CARDIAC CATHETERIZATION     with coronary angiogram  . COLONOSCOPY N/A 09/09/2014   Procedure: COLONOSCOPY;  Surgeon: Gatha Mayer, MD;  Location: [facility];  Service: Endoscopy;  Laterality: N/A;  . LEFT AND RIGHT HEART CATHETERIZATION WITH  CORONARY ANGIOGRAM N/A 05/14/2012   Procedure: LEFT AND RIGHT HEART CATHETERIZATION WITH CORONARY ANGIOGRAM;  Surgeon: Laverda Page, MD;  Location: [facility] CATH LAB;  Service: Cardiovascular;  Laterality: N/A;  . MINI METER   09/08/2014   ELECTRONIC INSULIN PUMP  . TEE WITHOUT CARDIOVERSION N/A 10/16/2018   Procedure: TRANSESOPHAGEAL ECHOCARDIOGRAM (TEE);  Surgeon: Buford Dresser, MD;  Location: [facility] ENDOSCOPY;  Service: Cardiovascular;  Laterality: N/A;  . THYROIDECTOMY  2006     OB History   None      Home Medications    Prior to Admission medications   Medication Sig Start Date End Date Taking? Authorizing Provider  acetaminophen (TYLENOL) 500 MG tablet Take 1,000 mg by mouth as needed for mild pain.    [provider]  atorvastatin (LIPITOR) 80 MG tablet TAKE 1 TABLET BY MOUTH DAILY 10/17/18   Shirley, [name], DO  clopidogrel (PLAVIX) 75 MG tablet TAKE 1 TABLET BY MOUTH EVERY DAY Patient taking differently: Take 75 mg by mouth daily.  07/15/18   Shirley, [name], DO  clopidogrel (PLAVIX) 75 MG tablet Take 1 tablet (75 mg total) by mouth daily. 10/17/18   Guadalupe Dawn, MD  CRANBERRY PO Take 1 tablet by mouth daily.    [provider]  Dextromethorphan-guaiFENesin (CORICIDIN HBP CONGESTION/COUGH PO) Take 1 tablet by mouth 2 (two) times daily.    [provider]  diclofenac sodium (VOLTAREN) 1 % GEL Apply 4 g topically 4 (four) times daily. 11/11/18   Shirley, [name], DO  fexofenadine (ALLEGRA) 30 MG tablet Take 30 mg by mouth daily as needed (allergies).     [provider]  FLUoxetine (PROZAC) 10 MG tablet Take 1 tablet (10 mg total) by mouth daily. 10/29/18   Shirley, [name], DO  Insulin Human (INSULIN PUMP) SOLN Inject into the skin continuous. humalog insulin    [provider]  levothyroxine (SYNTHROID, LEVOTHROID) 175 MCG tablet Take 175 mcg by mouth daily. 06/06/17   [provider]  losartan-hydrochlorothiazide (HYZAAR)  100-12.5 MG tablet Take 1 tablet by mouth daily. 01/30/18   Guadalupe Dawn, MD  metoprolol succinate (TOPROL-XL) 50 MG 24 hr tablet TAKE 1 TABLET BY MOUTH DAILY 10/17/18   Shirley, [name], DO  multivitamin-lutein [facility]) CAPS capsule Take 1 capsule by mouth daily.    [provider]  pantoprazole (PROTONIX) 40 MG tablet TAKE 1 TABLET BY MOUTH TWICE DAILY, BEFORE MEALS 11/11/18   Enid Derry, [name], DO  polyethylene glycol powder (GLYCOLAX/MIRALAX) powder Take 17 g by mouth 2 (two) times daily. 11/16/18   Montine Circle, PA-C  ranitidine (ZANTAC) 150 MG tablet TAKE 1 TABLET BY MOUTH TWICE DAILY Patient taking differently: Take 150 mg by mouth 2 (two) times daily.  09/23/18   Shirley, [name], DO  simethicone (GAS-X) 80 MG chewable tablet Chew 1 tablet (80 mg total) by mouth every 6 (six) hours as needed for flatulence. 11/16/18   Montine Circle, PA-C    Family History Family History  Problem Relation Age of Onset  . Heart  disease Mother        MI in 59s  . Hyperlipidemia Mother   . Hypertension Mother   . Renal Disease Mother        insufficiency  . Early death Father        Head Injury at Work  . Hyperlipidemia Father   . Hypertension Father   . Stroke Son   . Diabetes Maternal Aunt   . Prostate cancer Paternal Uncle   . Heart disease Maternal Aunt   . Heart failure Maternal Grandfather        Died of "heart attack" in his 52s  . Heart disease Maternal Grandfather   . Breast cancer Neg Hx     Social History Social History   Tobacco Use  . Smoking status: Never Smoker  . Smokeless tobacco: Never Used  Substance Use Topics  . Alcohol use: No  . Drug use: No     Allergies   Latex   Review of Systems Review of Systems  Respiratory: Positive for shortness of breath.   Cardiovascular: Positive for chest pain.  Gastrointestinal: Positive for vomiting.  All other systems reviewed and are negative.    Physical Exam Updated Vital Signs BP (!) 129/56  (BP Location: Right Arm)   Pulse (!) 55   Temp 98.4 F (36.9 C) (Oral)   Resp (!) 26   Ht 5\' 7"  (1.702 m)   Wt 122.9 kg   SpO2 100%   BMI 42.44 kg/m   Physical Exam  Constitutional: She is oriented to person, place, and time. She appears well-developed and well-nourished.  HENT:  Head: Normocephalic and atraumatic.  Mouth/Throat: Oropharynx is clear and moist.  Eyes: Pupils are equal, round, and reactive to light. Conjunctivae and EOM are normal.  Neck: Normal range of motion.  Cardiovascular: Normal rate, regular rhythm and normal heart sounds.  Pulmonary/Chest: Effort normal and breath sounds normal. She has no decreased breath sounds. She has no wheezes.  No deformities of chest wall, no signs of trauma    Abdominal: Soft. Bowel sounds are normal.  Musculoskeletal: Normal range of motion.  Neurological: She is alert and oriented to person, place, and time.  Skin: Skin is warm and dry.  Psychiatric: She has a normal mood and affect.  Nursing note and vitals reviewed.    ED Treatments / Results  Labs (all labs ordered are listed, but only abnormal results are displayed) Labs Reviewed  BASIC METABOLIC PANEL - Abnormal; Notable for the following components:      Result Value   Sodium 125 (*)    Potassium 3.4 (*)    Chloride 93 (*)    CO2 20 (*)    Glucose, Bld 184 (*)    BUN 6 (*)    Calcium 8.8 (*)    All other components within normal limits  CBC - Abnormal; Notable for the following components:   Hemoglobin 11.8 (*)    All other components within normal limits  TSH  I-STAT TROPONIN, ED  I-STAT TROPONIN, ED    EKG None  Radiology Dg Chest 2 View  Result Date: 11/25/2018 CLINICAL DATA:  67 year old female with chest pain and shortness of breath. EXAM: CHEST - 2 VIEW COMPARISON:  Chest radiograph dated 10/25/2018 FINDINGS: The lungs are clear. There is no pleural effusion or pneumothorax. Top-normal cardiac size. There is osteopenia with degenerative changes  of the spine. No acute osseous pathology. Surgical clips the region the thyroid gland. IMPRESSION: No active cardiopulmonary disease. Electronically  Signed   By: Anner Crete M.D.   On: 11/25/2018 01:53    Procedures Procedures (including critical care time)  Medications Ordered in ED Medications  famotidine (PEPCID) IVPB 20 mg premix (0 mg Intravenous Stopped 11/25/18 0221)  alum & mag hydroxide-simeth (MAALOX/MYLANTA) 200-200-20 MG/5ML suspension 30 mL (30 mLs Oral Given 11/25/18 0151)    And  lidocaine (XYLOCAINE) 2 % viscous mouth solution 15 mL (15 mLs Oral Given 11/25/18 0151)     Initial Impression / Assessment and Plan / ED Course  I have reviewed the triage vital signs and the nursing notes.  Pertinent labs & imaging results that were available during my care of the patient were reviewed by me and considered in my medical decision making (see chart for details).  67 y.o. F here with chest pain and SOB.  Has been ongoing for about a week, described as  "bubble" in her left upper chest. Seen by PCP, started on medications for indigestion and was referred to GI, but no appointment has been set yet.  Does report one episode of emesis prior to arrival.  She is afebrile and nontoxic in appearance.  She has some mild tenderness of the left upper chest, but states it feels like "a bubble is stuck in there".  Her lungs are clear without any wheezes or rhonchi.  Vitals are overall stable.  EKG without any acute ischemic changes.  Labs and chest x-ray pending.  Add TSH given her complaint of weight gain and fatigue.  Patient symptom free after GI cocktail and pepcid.  Lab work essentially unchanged from last ED visit.  CXR clear.  TSH WNL.  Suspect her symptoms are GI related, however given her age and risk factors will obtain delta troponin.  4:34 AM Delta trop is negative.  Patient remains asymptomatic here after medications.  Will prescribe some of similar at home-- can switch to Pepcid if  Protonix still not working as she had better response to that here.  It appears she was seen by GI, Dr. Carlean Purl, in the past.  Have given her contact information for his office to help facilitate some follow-up.  She can return here for new or worsening symptoms.  Final Clinical Impressions(s) / ED Diagnoses   Final diagnoses:  Gastroesophageal reflux disease without esophagitis  Chest pain in adult    ED Discharge Orders         Ordered    alum & mag hydroxide-simeth (MAALOX/MYLANTA) 200-200-20 MG/5ML suspension  Every 6 hours PRN     11/25/18 0436    famotidine (PEPCID) 20 MG tablet  2 times daily     11/25/18 0436           Larene Pickett, PA-C 11/25/18 3546    Orpah Greek, MD 11/25/18 [phone]

## 2018-11-26 ENCOUNTER — Emergency Department (HOSPITAL_COMMUNITY)
Admission: EM | Admit: 2018-11-26 | Discharge: 2018-11-27 | Disposition: A | Discharge status: Home / Self Care | Payer: Medicare Other | Attending: Emergency Medicine | Admitting: Emergency Medicine

## 2018-11-26 ENCOUNTER — Other Ambulatory Visit: Payer: Self-pay

## 2018-11-26 DIAGNOSIS — Z79899 Other long term (current) drug therapy: Secondary | ICD-10-CM | POA: Insufficient documentation

## 2018-11-26 DIAGNOSIS — K3184 Gastroparesis: Secondary | ICD-10-CM

## 2018-11-26 DIAGNOSIS — I110 Hypertensive heart disease with heart failure: Secondary | ICD-10-CM | POA: Diagnosis not present

## 2018-11-26 DIAGNOSIS — E109 Type 1 diabetes mellitus without complications: Secondary | ICD-10-CM | POA: Insufficient documentation

## 2018-11-26 DIAGNOSIS — Z91040 Latex allergy status: Secondary | ICD-10-CM | POA: Diagnosis not present

## 2018-11-26 DIAGNOSIS — Z8673 Personal history of transient ischemic attack (TIA), and cerebral infarction without residual deficits: Secondary | ICD-10-CM | POA: Diagnosis not present

## 2018-11-26 DIAGNOSIS — Z794 Long term (current) use of insulin: Secondary | ICD-10-CM | POA: Insufficient documentation

## 2018-11-26 DIAGNOSIS — Z7902 Long term (current) use of antithrombotics/antiplatelets: Secondary | ICD-10-CM | POA: Insufficient documentation

## 2018-11-26 DIAGNOSIS — E785 Hyperlipidemia, unspecified: Secondary | ICD-10-CM | POA: Diagnosis not present

## 2018-11-26 DIAGNOSIS — E039 Hypothyroidism, unspecified: Secondary | ICD-10-CM | POA: Diagnosis not present

## 2018-11-26 DIAGNOSIS — Z85850 Personal history of malignant neoplasm of thyroid: Secondary | ICD-10-CM | POA: Diagnosis not present

## 2018-11-26 DIAGNOSIS — I509 Heart failure, unspecified: Secondary | ICD-10-CM | POA: Insufficient documentation

## 2018-11-26 DIAGNOSIS — R1013 Epigastric pain: Secondary | ICD-10-CM | POA: Diagnosis present

## 2018-11-26 NOTE — Telephone Encounter (Signed)
Patient not to continue magnesium, as level was wnl and she is taking maalox. Thanks!

## 2018-11-26 NOTE — Telephone Encounter (Signed)
Refill request for magnesium 27 mg, not on current medication list.  Danley Danker, RN [facility] RN)

## 2018-11-26 NOTE — Telephone Encounter (Signed)
Mychart message sent to patient with message from MD. Jazmin Hartsell,CMA  

## 2018-11-27 ENCOUNTER — Encounter (HOSPITAL_COMMUNITY): Payer: Self-pay | Admitting: Emergency Medicine

## 2018-11-27 LAB — COMPREHENSIVE METABOLIC PANEL
ALT: 19 U/L (ref 0–44)
AST: 26 U/L (ref 15–41)
Albumin: 3.4 g/dL — ABNORMAL LOW (ref 3.5–5.0)
Alkaline Phosphatase: 83 U/L (ref 38–126)
Anion gap: 12 (ref 5–15)
BUN: <5 mg/dL — ABNORMAL LOW (ref 8–23)
CO2: 25 mmol/L (ref 22–32)
Calcium: 9.3 mg/dL (ref 8.9–10.3)
Chloride: 94 mmol/L — ABNORMAL LOW (ref 98–111)
Creatinine, Ser: 0.99 mg/dL (ref 0.44–1.00)
GFR calc Af Amer: >60 mL/min (ref >60)
GFR calc non Af Amer: 59 mL/min — ABNORMAL LOW (ref >60)
Glucose, Bld: 114 mg/dL — ABNORMAL HIGH (ref 70–99)
Potassium: 3.4 mmol/L — ABNORMAL LOW (ref 3.5–5.1)
Sodium: 131 mmol/L — ABNORMAL LOW (ref 135–145)
Total Bilirubin: 0.7 mg/dL (ref 0.3–1.2)
Total Protein: 6.7 g/dL (ref 6.5–8.1)

## 2018-11-27 LAB — URINALYSIS, ROUTINE W REFLEX MICROSCOPIC
Bilirubin Urine: NEGATIVE
GLUCOSE, UA: NEGATIVE mg/dL
HGB URINE DIPSTICK: NEGATIVE
Ketones, ur: NEGATIVE mg/dL
Leukocytes, UA: NEGATIVE
Nitrite: NEGATIVE
PH: 8 (ref 5.0–8.0)
Protein, ur: NEGATIVE mg/dL
Specific Gravity, Urine: 1.004 — ABNORMAL LOW (ref 1.005–1.030)

## 2018-11-27 LAB — CBC
HEMATOCRIT: 37.2 % (ref 36.0–46.0)
Hemoglobin: 11.6 g/dL — ABNORMAL LOW (ref 12.0–15.0)
MCH: 27.4 pg (ref 26.0–34.0)
MCHC: 31.2 g/dL (ref 30.0–36.0)
MCV: 87.7 fL (ref 80.0–100.0)
Platelets: 291 10*3/uL (ref 150–400)
RBC: 4.24 MIL/uL (ref 3.87–5.11)
RDW: 12 % (ref 11.5–15.5)
WBC: 4.2 10*3/uL (ref 4.0–10.5)
nRBC: 0 % (ref 0.0–0.2)

## 2018-11-27 LAB — CBG MONITORING, ED
GLUCOSE-CAPILLARY: 113 mg/dL — AB (ref 70–99)
Glucose-Capillary: 204 mg/dL — ABNORMAL HIGH (ref 70–99)

## 2018-11-27 LAB — LIPASE, BLOOD: Lipase: 17 U/L (ref 11–51)

## 2018-11-27 MED ORDER — METOCLOPRAMIDE HCL 5 MG/ML IJ SOLN
10.0000 mg | Freq: Once | INTRAMUSCULAR | Status: AC
  Administered 2018-11-27: 10 mg via INTRAVENOUS
  Filled 2018-11-27: qty 2

## 2018-11-27 MED ORDER — LORAZEPAM 2 MG/ML IJ SOLN
0.5000 mg | Freq: Once | INTRAMUSCULAR | Status: DC
  Filled 2018-11-27: qty 1

## 2018-11-27 MED ORDER — METOCLOPRAMIDE HCL 10 MG PO TABS: 10.0000 mg | ORAL_TABLET | Freq: Three times a day (TID) | ORAL | 0 refills | Status: DC | PRN

## 2018-11-27 MED ORDER — HYDROXYZINE HCL 25 MG PO TABS
25.0000 mg | ORAL_TABLET | Freq: Once | ORAL | Status: AC
  Administered 2018-11-27: 25 mg via ORAL
  Filled 2018-11-27: qty 1

## 2018-11-27 MED ORDER — SODIUM CHLORIDE 0.9 % IV BOLUS
500.0000 mL | Freq: Once | INTRAVENOUS | Status: AC
  Administered 2018-11-27: 500 mL via INTRAVENOUS

## 2018-11-27 NOTE — ED Notes (Signed)
ED Provider at bedside. 

## 2018-11-27 NOTE — ED Triage Notes (Signed)
Pt reports continued abd pain w/ emesis and diarrhea.  Pt has a GI appointment early January and "cant wait to control the pain."  Type 1 diabetic

## 2018-11-27 NOTE — ED Notes (Addendum)
Pt verbalized understanding of dc instructions/Rx, VSS, pt ambulatory upon discharge with nad

## 2018-11-27 NOTE — ED Provider Notes (Signed)
[name] Provider Note   CSN: [id number] Arrival date & time: 11/26/18  2322     History   Chief Complaint Chief Complaint  Patient presents with  . Abdominal Pain  . Diarrhea    HPI [name] is a 67 y.o. female.  Patient presents to the emergency [name] for evaluation of abdominal pain with nausea and vomiting.  Patient reports that she has been seen in the ER several times for this.  She followed up with her primary care physician and was told she might have gastroparesis secondary to her diabetes.  She is scheduled to follow-up with gastroenterology, but has not had the appointment yet.  Patient reports that she continues to have upper abdominal pain and vomiting.  She cannot eat anything and has difficulty swallowing liquids because of the nausea and vomiting.     Past Medical History:  Diagnosis Date  . Anemia   . Arthritis   . CHF (congestive heart failure) ([place])   . Depression   . GERD (gastroesophageal reflux disease)   . Hemorrhoids   . Hyperlipidemia   . Hypertension   . Hypothyroidism   . Seasonal allergies   . Stroke ([place])   . Thyroid cancer ([place])    per pt S/p Total Thyroidectomy with Radioactive Iodine Therapy  . Type 1 diabetes mellitus [facility])     Patient Active Problem List   Diagnosis Date Noted  . 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 Urinary frequency 10/29/2018  . Abnormal echocardiogram   . Stroke (cerebrum) ([place]) 10/13/2018  . Frequent PVCs   . Uncontrolled type 1 diabetes mellitus ([place])   . BPPV (benign paroxysmal positional vertigo), right 06/13/2017  . Premature atrial beats 06/13/2017  . Snoring 07/31/2016  . Pre-ulcerative corn or callous 12/15/2015  . History of CHF (congestive heart failure) 10/29/2015  . Essential hypertension 10/29/2015  . 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 Thyroid activity decreased   .  Intracranial vascular stenosis   . HLD (hyperlipidemia)   . 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 CVA (cerebral vascular accident) ([place]) 08/29/2015  . Posterior tibial tendinitis of right leg 04/26/2014  . Atypical chest pain 10/06/2013  . Morbid obesity ([place]) 05/29/2013  . Back pain without radiation 04/28/2013  . Nail dystrophy 01/10/2013  . Diabetes mellitus ([place]) 01/25/2012  . Hyperlipidemia 01/25/2012  . Hypothyroidism 01/25/2012  . Hypertension 01/25/2012  . GERD (gastroesophageal reflux disease) 01/25/2012    Past Surgical History:  Procedure Laterality Date  . ABDOMINAL ANGIOGRAM N/A 05/14/2012   Procedure: ABDOMINAL ANGIOGRAM;  Surgeon: Laverda Page, MD;  Location: [facility] CATH LAB;  Service: Cardiovascular;  Laterality: N/A;  . ABDOMINAL HYSTERECTOMY  1995   partial  . ANKLE FRACTURE SURGERY Right   . CARDIAC CATHETERIZATION     with coronary angiogram  . COLONOSCOPY N/A 09/09/2014   Procedure: COLONOSCOPY;  Surgeon: Gatha Mayer, MD;  Location: [place];  Service: Endoscopy;  Laterality: N/A;  . LEFT AND RIGHT HEART CATHETERIZATION WITH CORONARY ANGIOGRAM N/A 05/14/2012   Procedure: LEFT AND RIGHT HEART CATHETERIZATION WITH CORONARY ANGIOGRAM;  Surgeon: Laverda Page, MD;  Location: [facility] CATH LAB;  Service: Cardiovascular;  Laterality: N/A;  . MINI METER   09/08/2014   ELECTRONIC INSULIN PUMP  . TEE WITHOUT CARDIOVERSION N/A 10/16/2018   Procedure: TRANSESOPHAGEAL ECHOCARDIOGRAM (TEE);  Surgeon: Buford Dresser, MD;  Location: [facility] ENDOSCOPY;  Service: Cardiovascular;  Laterality: N/A;  . THYROIDECTOMY  2006     OB History   None      Home Medications    Prior to Admission medications   Medication Sig Start Date End Date Taking? Authorizing Provider  acetaminophen (TYLENOL) 500 MG tablet Take 1,000 mg by mouth as needed for mild pain.   Yes [provider]  alum & mag hydroxide-simeth (MAALOX/MYLANTA) 200-200-20  MG/5ML suspension Take 15 mLs by mouth every 6 (six) hours as needed for indigestion or heartburn. 11/25/18  Yes Larene Pickett, PA-C  atorvastatin (LIPITOR) 80 MG tablet TAKE 1 TABLET BY MOUTH DAILY Patient taking differently: Take 80 mg by mouth daily at 6 PM.  10/17/18  Yes Enid Derry, [name], DO  clopidogrel (PLAVIX) 75 MG tablet Take 1 tablet (75 mg total) by mouth daily. 10/17/18  Yes Guadalupe Dawn, MD  CRANBERRY PO Take 1 tablet by mouth daily.   Yes [provider]  diclofenac sodium (VOLTAREN) 1 % GEL Apply 4 g topically 4 (four) times daily. 11/11/18  Yes Enid Derry, [name], DO  famotidine (PEPCID) 20 MG tablet Take 1 tablet (20 mg total) by mouth 2 (two) times daily. 11/25/18  Yes Larene Pickett, PA-C  fexofenadine (ALLEGRA) 30 MG tablet Take 30 mg by mouth daily as needed (allergies).    Yes [provider]  FLUoxetine (PROZAC) 10 MG tablet Take 1 tablet (10 mg total) by mouth daily. 10/29/18  Yes Enid Derry, [name], DO  Insulin Human (INSULIN PUMP) SOLN Inject into the skin continuous. humalog insulin   Yes [provider]  levothyroxine (SYNTHROID, LEVOTHROID) 175 MCG tablet Take 175 mcg by mouth daily. 06/06/17  Yes [provider]  losartan-hydrochlorothiazide (HYZAAR) 100-12.5 MG tablet Take 1 tablet by mouth daily. 01/30/18  Yes Guadalupe Dawn, MD  metoprolol succinate (TOPROL-XL) 50 MG 24 hr tablet TAKE 1 TABLET BY MOUTH DAILY Patient taking differently: Take 50 mg by mouth daily.  10/17/18  Yes Enid Derry, [name], DO  pantoprazole (PROTONIX) 40 MG tablet TAKE 1 TABLET BY MOUTH TWICE DAILY, BEFORE MEALS Patient taking differently: Take 40 mg by mouth 2 (two) times daily.  11/11/18  Yes Enid Derry, [name], DO  polyethylene glycol powder (GLYCOLAX/MIRALAX) powder Take 17 g by mouth 2 (two) times daily. 11/16/18  Yes Montine Circle, PA-C  ranitidine (ZANTAC) 150 MG tablet TAKE 1 TABLET BY MOUTH TWICE DAILY Patient taking differently: Take 150 mg by mouth 2  (two) times daily.  09/23/18  Yes Enid Derry, [name], DO  simethicone (GAS-X) 80 MG chewable tablet Chew 1 tablet (80 mg total) by mouth every 6 (six) hours as needed for flatulence. 11/16/18  Yes Montine Circle, PA-C  clopidogrel (PLAVIX) 75 MG tablet TAKE 1 TABLET BY MOUTH EVERY DAY Patient not taking: No sig reported 07/15/18   Shirley, [name], DO  metoCLOPramide (REGLAN) 10 MG tablet Take 1 tablet (10 mg total) by mouth every 8 (eight) hours as needed for nausea or vomiting (nausea/headache). 11/27/18   Orpah Greek, MD    Family History Family History  Problem Relation Age of Onset  . Heart disease Mother        MI in 6s  . Hyperlipidemia Mother   . Hypertension Mother   . Renal Disease Mother        insufficiency  . Early death Father        Head Injury at Work  . Hyperlipidemia Father   . Hypertension Father   . Stroke Son   . Diabetes  Maternal Aunt   . Prostate cancer Paternal Uncle   . Heart disease Maternal Aunt   . Heart failure Maternal Grandfather        Died of "heart attack" in his 33s  . Heart disease Maternal Grandfather   . Breast cancer Neg Hx     Social History Social History   Tobacco Use  . Smoking status: Never Smoker  . Smokeless tobacco: Never Used  Substance Use Topics  . Alcohol use: No  . Drug use: No     Allergies   Latex   Review of Systems Review of Systems  Gastrointestinal: Positive for abdominal pain, nausea and vomiting.  All other systems reviewed and are negative.    Physical Exam Updated Vital Signs BP 120/60   Pulse (!) 58   Temp 97.7 F (36.5 C) (Oral)   Resp 16   Ht 5\' 7"  (1.702 m)   Wt 121.6 kg   SpO2 99%   BMI 41.97 kg/m   Physical Exam  Constitutional: She is oriented to person, place, and time. She appears well-developed and well-nourished. No distress.  HENT:  Head: Normocephalic and atraumatic.  Right Ear: Hearing normal.  Left Ear: Hearing normal.  Nose: Nose normal.  Mouth/Throat:  Oropharynx is clear and moist and mucous membranes are normal.  Eyes: Pupils are equal, round, and reactive to light. Conjunctivae and EOM are normal.  Neck: Normal range of motion. Neck supple.  Cardiovascular: Regular rhythm, S1 normal and S2 normal. Exam reveals no gallop and no friction rub.  No murmur heard. Pulmonary/Chest: Effort normal and breath sounds normal. No respiratory distress. She exhibits no tenderness.  Abdominal: Soft. Normal appearance and bowel sounds are normal. There is no hepatosplenomegaly. There is tenderness in the epigastric area. There is no rebound, no guarding, no tenderness at McBurney's point and negative Murphy's sign. No hernia.  Musculoskeletal: Normal range of motion.  Neurological: She is alert and oriented to person, place, and time. She has normal strength. No cranial nerve deficit or sensory deficit. Coordination normal. GCS eye subscore is 4. GCS verbal subscore is 5. GCS motor subscore is 6.  Skin: Skin is warm, dry and intact. No rash noted. No cyanosis.  Psychiatric: She has a normal mood and affect. Her speech is normal and behavior is normal. Thought content normal.  Nursing note and vitals reviewed.    ED Treatments / Results  Labs (all labs ordered are listed, but only abnormal results are displayed) Labs Reviewed  COMPREHENSIVE METABOLIC PANEL - Abnormal; Notable for the following components:      Result Value   Sodium 131 (*)    Potassium 3.4 (*)    Chloride 94 (*)    Glucose, Bld 114 (*)    BUN <5 (*)    Albumin 3.4 (*)    GFR calc non Af Amer 59 (*)    All other components within normal limits  CBC - Abnormal; Notable for the following components:   Hemoglobin 11.6 (*)    All other components within normal limits  URINALYSIS, ROUTINE W REFLEX MICROSCOPIC - Abnormal; Notable for the following components:   Color, Urine STRAW (*)    Specific Gravity, Urine 1.004 (*)    All other components within normal limits  CBG MONITORING, ED  - Abnormal; Notable for the following components:   Glucose-Capillary 113 (*)    All other components within normal limits  CBG MONITORING, ED - Abnormal; Notable for the following components:   Glucose-Capillary 204 (*)  All other components within normal limits  LIPASE, BLOOD    EKG None  Radiology No results found.  Procedures Procedures (including critical care time)  Medications Ordered in ED Medications  LORazepam (ATIVAN) injection 0.5 mg (0 mg Intravenous Hold 11/27/18 0456)  metoCLOPramide (REGLAN) injection 10 mg (10 mg Intravenous Given 11/27/18 0329)  hydrOXYzine (ATARAX/VISTARIL) tablet 25 mg (25 mg Oral Given 11/27/18 0330)  sodium chloride 0.9 % bolus 500 mL (0 mLs Intravenous Stopped 11/27/18 0445)     Initial Impression / Assessment and Plan / ED Course  I have reviewed the triage vital signs and the nursing notes.  Pertinent labs & imaging results that were available during my care of the patient were reviewed by me and considered in my medical decision making (see chart for details).     Patient presents to the emergency [name] for evaluation of abdominal pain with nausea and vomiting.  Patient has been seen for this in the past several times.  Patient has had CT scan, multiple episodes of blood work without any acute findings.  Patient's primary care physician is speculating that she has gastroparesis.  She was administered IV fluids, Reglan, Ativan, Vistaril and has had significant improvement.  No further nausea or abdominal pain.  Patient tolerating oral intake.  Her primary care doctor is arranging for GI follow-up, appropriate to continue outpatient work-up.  Final Clinical Impressions(s) / ED Diagnoses   Final diagnoses:  Gastroparesis    ED Discharge Orders         Ordered    metoCLOPramide (REGLAN) 10 MG tablet  Every 8 hours PRN     11/27/18 0615           Orpah Greek, MD 11/27/18 [phone]

## 2018-11-30 ENCOUNTER — Emergency Department (HOSPITAL_COMMUNITY)
Admission: EM | Admit: 2018-11-30 | Discharge: 2018-11-30 | Disposition: A | Discharge status: Home / Self Care | Payer: Medicare Other | Attending: Emergency Medicine | Admitting: Emergency Medicine

## 2018-11-30 ENCOUNTER — Emergency Department (HOSPITAL_COMMUNITY): Payer: Medicare Other

## 2018-11-30 ENCOUNTER — Other Ambulatory Visit: Payer: Self-pay

## 2018-11-30 ENCOUNTER — Encounter (HOSPITAL_COMMUNITY): Payer: Self-pay

## 2018-11-30 DIAGNOSIS — R112 Nausea with vomiting, unspecified: Secondary | ICD-10-CM

## 2018-11-30 DIAGNOSIS — Z79899 Other long term (current) drug therapy: Secondary | ICD-10-CM | POA: Diagnosis not present

## 2018-11-30 DIAGNOSIS — E109 Type 1 diabetes mellitus without complications: Secondary | ICD-10-CM | POA: Insufficient documentation

## 2018-11-30 DIAGNOSIS — Z91040 Latex allergy status: Secondary | ICD-10-CM | POA: Insufficient documentation

## 2018-11-30 DIAGNOSIS — I509 Heart failure, unspecified: Secondary | ICD-10-CM | POA: Insufficient documentation

## 2018-11-30 DIAGNOSIS — I110 Hypertensive heart disease with heart failure: Secondary | ICD-10-CM | POA: Insufficient documentation

## 2018-11-30 DIAGNOSIS — E039 Hypothyroidism, unspecified: Secondary | ICD-10-CM | POA: Insufficient documentation

## 2018-11-30 DIAGNOSIS — K3184 Gastroparesis: Secondary | ICD-10-CM | POA: Diagnosis not present

## 2018-11-30 LAB — CBC WITH DIFFERENTIAL/PLATELET
Abs Immature Granulocytes: 0.01 10*3/uL (ref 0.00–0.07)
Basophils Absolute: 0 10*3/uL (ref 0.0–0.1)
Basophils Relative: 1 %
EOS ABS: 0 10*3/uL (ref 0.0–0.5)
Eosinophils Relative: 1 %
HCT: 35.8 % — ABNORMAL LOW (ref 36.0–46.0)
Hemoglobin: 11.6 g/dL — ABNORMAL LOW (ref 12.0–15.0)
IMMATURE GRANULOCYTES: 0 %
LYMPHS ABS: 1.2 10*3/uL (ref 0.7–4.0)
Lymphocytes Relative: 23 %
MCH: 28 pg (ref 26.0–34.0)
MCHC: 32.4 g/dL (ref 30.0–36.0)
MCV: 86.3 fL (ref 80.0–100.0)
Monocytes Absolute: 1 10*3/uL (ref 0.1–1.0)
Monocytes Relative: 20 %
Neutro Abs: 2.8 10*3/uL (ref 1.7–7.7)
Neutrophils Relative %: 55 %
Platelets: 262 10*3/uL (ref 150–400)
RBC: 4.15 MIL/uL (ref 3.87–5.11)
RDW: 11.9 % (ref 11.5–15.5)
WBC: 5 10*3/uL (ref 4.0–10.5)
nRBC: 0 % (ref 0.0–0.2)

## 2018-11-30 LAB — URINALYSIS, ROUTINE W REFLEX MICROSCOPIC
Bilirubin Urine: NEGATIVE
Glucose, UA: >=500 mg/dL — AB
Hgb urine dipstick: NEGATIVE
KETONES UR: NEGATIVE mg/dL
Leukocytes, UA: NEGATIVE
NITRITE: NEGATIVE
Protein, ur: NEGATIVE mg/dL
Specific Gravity, Urine: 1.003 — ABNORMAL LOW (ref 1.005–1.030)
pH: 7 (ref 5.0–8.0)

## 2018-11-30 LAB — COMPREHENSIVE METABOLIC PANEL
ALT: 22 U/L (ref 0–44)
AST: 25 U/L (ref 15–41)
Albumin: 3.4 g/dL — ABNORMAL LOW (ref 3.5–5.0)
Alkaline Phosphatase: 82 U/L (ref 38–126)
Anion gap: 13 (ref 5–15)
BUN: 5 mg/dL — ABNORMAL LOW (ref 8–23)
CO2: 23 mmol/L (ref 22–32)
Calcium: 9.1 mg/dL (ref 8.9–10.3)
Chloride: 95 mmol/L — ABNORMAL LOW (ref 98–111)
Creatinine, Ser: 1.09 mg/dL — ABNORMAL HIGH (ref 0.44–1.00)
GFR calc Af Amer: >60 mL/min (ref >60)
GFR calc non Af Amer: 52 mL/min — ABNORMAL LOW (ref >60)
Glucose, Bld: 246 mg/dL — ABNORMAL HIGH (ref 70–99)
Potassium: 3.9 mmol/L (ref 3.5–5.1)
Sodium: 131 mmol/L — ABNORMAL LOW (ref 135–145)
Total Bilirubin: 0.7 mg/dL (ref 0.3–1.2)
Total Protein: 6.6 g/dL (ref 6.5–8.1)

## 2018-11-30 LAB — CBG MONITORING, ED: Glucose-Capillary: 242 mg/dL — ABNORMAL HIGH (ref 70–99)

## 2018-11-30 LAB — LIPASE, BLOOD: Lipase: 20 U/L (ref 11–51)

## 2018-11-30 LAB — POC OCCULT BLOOD, ED: Fecal Occult Bld: NEGATIVE

## 2018-11-30 MED ORDER — ONDANSETRON HCL 4 MG/2ML IJ SOLN
4.0000 mg | Freq: Once | INTRAMUSCULAR | Status: AC
  Administered 2018-11-30: 4 mg via INTRAVENOUS
  Filled 2018-11-30: qty 2

## 2018-11-30 MED ORDER — METOCLOPRAMIDE HCL 10 MG PO TABS: 10.0000 mg | ORAL_TABLET | Freq: Four times a day (QID) | ORAL | 0 refills | Status: DC

## 2018-11-30 MED ORDER — ACETAMINOPHEN 500 MG PO TABS
1000.0000 mg | ORAL_TABLET | Freq: Once | ORAL | Status: AC
  Administered 2018-11-30: 1000 mg via ORAL
  Filled 2018-11-30: qty 2

## 2018-11-30 MED ORDER — SODIUM CHLORIDE 0.9 % IV BOLUS
1000.0000 mL | Freq: Once | INTRAVENOUS | Status: AC
  Administered 2018-11-30: 1000 mL via INTRAVENOUS

## 2018-11-30 NOTE — ED Notes (Signed)
Pt returned from imaging.

## 2018-11-30 NOTE — ED Triage Notes (Signed)
Pt from home with complaint of n/v/d x 4 days and "I think I saw some blood in my stool" Has hx of diabetic gastroparesis. Pt also states "I can't hardly catch my breath"

## 2018-11-30 NOTE — Discharge Instructions (Signed)
Please see your family doctor at the [facility] family practice Center within the next couple of days.  You will need to have a follow-up arranged, to see the gastroenterologist, or return to the emergency department for severe or worsening symptoms.  Please follow following directions  Drink small amounts of fluid frequently throughout the day, 2 ounces or less at a time. Take Reglan, 10 mg by mouth every 6 hours for the next week, if this makes you feel stiff take a Benadryl See your doctor within 3 days for a recheck Emergency department for severe or worsening symptoms Your testing today was reassuring and showed no signs of significant infections or other abnormal problems in your abdomen You likely have gastroparesis, please read the attached instructions.

## 2018-11-30 NOTE — ED Provider Notes (Signed)
[facility] EMERGENCY DEPARTMENT Provider Note   CSN: [id number] Arrival date & time: 11/30/18  1735     History   Chief Complaint Chief Complaint  Patient presents with  . Diarrhea    HPI [name] [name] is a 67 y.o. female.  HPI  The patient is a 67 year old female, she has a known history of congestive heart failure as well as diabetes, she has had thyroid cancer status post total thyroidectomy, she is a type I diabetic using an insulin pump, she has hypertension and hyperlipidemia.  Review of the medical records shows that the patient has had a visit on 3 December, 2 December, 23 November, 1 November and 20 October after having a stroke.  The other visits were for having GI symptoms.  The patient reports that she continues to have abdominal distention, she feels like her abdomen is hard, she is nauseated, she has vomiting, she states that she had some blood in her stools today one time but is not having any dark black stools or red stools, it was a small amount of blood on the paper.  She reports that she cannot take her for self, she her family cannot take care of her, she does not know why she continues to have the symptoms but is persistently nauseated.  That being said she has had multiple work-ups including multiple labs, on 4 December they were all essentially normal, she had a CT scan recently, on November 23, she also had multiple CT scans of the brain and an MRI of the brain in October.  CT scan from November 16, 2018, approximately 2 weeks ago was unremarkable for any abdominal process.  The patient has been taking the Reglan stating that it does not help.  Past Medical History:  Diagnosis Date  . Anemia   . Arthritis   . CHF (congestive heart failure) ([place])   . Depression   . GERD (gastroesophageal reflux disease)   . Hemorrhoids   . Hyperlipidemia   . Hypertension   . Hypothyroidism   . Seasonal allergies   . Stroke ([place])   .  Thyroid cancer ([place])    per pt S/p Total Thyroidectomy with Radioactive Iodine Therapy  . Type 1 diabetes mellitus [facility])     Patient Active Problem List   Diagnosis Date Noted  . 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 Urinary frequency 10/29/2018  . Abnormal echocardiogram   . Stroke (cerebrum) ([place]) 10/13/2018  . Frequent PVCs   . Uncontrolled type 1 diabetes mellitus ([place])   . BPPV (benign paroxysmal positional vertigo), right 06/13/2017  . Premature atrial beats 06/13/2017  . Snoring 07/31/2016  . Pre-ulcerative corn or callous 12/15/2015  . History of CHF (congestive heart failure) 10/29/2015  . Essential hypertension 10/29/2015  . 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 Thyroid activity decreased   . Intracranial vascular stenosis   . HLD (hyperlipidemia)   . 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 CVA (cerebral vascular accident) ([place]) 08/29/2015  . Posterior tibial tendinitis of right leg 04/26/2014  . Atypical chest pain 10/06/2013  . Morbid obesity ([place]) 05/29/2013  . Back pain without radiation 04/28/2013  . Nail dystrophy 01/10/2013  . Diabetes mellitus ([place]) 01/25/2012  . Hyperlipidemia 01/25/2012  . Hypothyroidism 01/25/2012  . Hypertension 01/25/2012  . GERD (gastroesophageal reflux disease) 01/25/2012    Past Surgical History:  Procedure Laterality Date  . ABDOMINAL  ANGIOGRAM N/A 05/14/2012   Procedure: ABDOMINAL ANGIOGRAM;  Surgeon: Laverda Page, MD;  Location: [facility] CATH LAB;  Service: Cardiovascular;  Laterality: N/A;  . ABDOMINAL HYSTERECTOMY  1995   partial  . ANKLE FRACTURE SURGERY Right   . CARDIAC CATHETERIZATION     with coronary angiogram  . COLONOSCOPY N/A 09/09/2014   Procedure: COLONOSCOPY;  Surgeon: Gatha Mayer, MD;  Location: [facility];  Service: Endoscopy;  Laterality: N/A;  . LEFT AND RIGHT HEART CATHETERIZATION WITH CORONARY ANGIOGRAM N/A 05/14/2012    Procedure: LEFT AND RIGHT HEART CATHETERIZATION WITH CORONARY ANGIOGRAM;  Surgeon: Laverda Page, MD;  Location: [facility] CATH LAB;  Service: Cardiovascular;  Laterality: N/A;  . MINI METER   09/08/2014   ELECTRONIC INSULIN PUMP  . TEE WITHOUT CARDIOVERSION N/A 10/16/2018   Procedure: TRANSESOPHAGEAL ECHOCARDIOGRAM (TEE);  Surgeon: Buford Dresser, MD;  Location: [facility] ENDOSCOPY;  Service: Cardiovascular;  Laterality: N/A;  . THYROIDECTOMY  2006     OB History   None      Home Medications    Prior to Admission medications   Medication Sig Start Date End Date Taking? Authorizing Provider  acetaminophen (TYLENOL) 500 MG tablet Take 1,000 mg by mouth as needed for mild pain.   Yes [provider]  alum & mag hydroxide-simeth (MAALOX/MYLANTA) 200-200-20 MG/5ML suspension Take 15 mLs by mouth every 6 (six) hours as needed for indigestion or heartburn. 11/25/18  Yes Larene Pickett, PA-C  atorvastatin (LIPITOR) 80 MG tablet TAKE 1 TABLET BY MOUTH DAILY Patient taking differently: Take 80 mg by mouth daily at 6 PM.  10/17/18  Yes Enid Derry, [name], DO  clopidogrel (PLAVIX) 75 MG tablet TAKE 1 TABLET BY MOUTH EVERY DAY Patient taking differently: Take 75 mg by mouth daily.  07/15/18  Yes Enid Derry, [name], DO  CRANBERRY PO Take 1 tablet by mouth daily.   Yes [provider]  diclofenac sodium (VOLTAREN) 1 % GEL Apply 4 g topically 4 (four) times daily. 11/11/18  Yes Enid Derry, [name], DO  famotidine (PEPCID) 20 MG tablet Take 1 tablet (20 mg total) by mouth 2 (two) times daily. 11/25/18  Yes Larene Pickett, PA-C  fexofenadine (ALLEGRA) 30 MG tablet Take 30 mg by mouth daily as needed (allergies).    Yes [provider]  FLUoxetine (PROZAC) 10 MG tablet Take 1 tablet (10 mg total) by mouth daily. 10/29/18  Yes Enid Derry, [name], DO  Insulin Human (INSULIN PUMP) SOLN Inject into the skin continuous. humalog insulin   Yes [provider]  levothyroxine (SYNTHROID,  LEVOTHROID) 175 MCG tablet Take 175 mcg by mouth daily. 06/06/17  Yes [provider]  losartan-hydrochlorothiazide (HYZAAR) 100-12.5 MG tablet Take 1 tablet by mouth daily. 01/30/18  Yes Guadalupe Dawn, MD  metoprolol succinate (TOPROL-XL) 50 MG 24 hr tablet TAKE 1 TABLET BY MOUTH DAILY Patient taking differently: Take 50 mg by mouth daily.  10/17/18  Yes Enid Derry, [name], DO  pantoprazole (PROTONIX) 40 MG tablet TAKE 1 TABLET BY MOUTH TWICE DAILY, BEFORE MEALS Patient taking differently: Take 40 mg by mouth 2 (two) times daily.  11/11/18  Yes Enid Derry, [name], DO  ranitidine (ZANTAC) 150 MG tablet TAKE 1 TABLET BY MOUTH TWICE DAILY Patient taking differently: Take 150 mg by mouth 2 (two) times daily.  09/23/18  Yes Enid Derry, [name], DO  simethicone (GAS-X) 80 MG chewable tablet Chew 1 tablet (80 mg total) by mouth every 6 (six) hours as needed for flatulence. 11/16/18  Yes Montine Circle, PA-C  clopidogrel (PLAVIX)  75 MG tablet Take 1 tablet (75 mg total) by mouth daily. Patient not taking: Reported on 11/30/2018 10/17/18   Guadalupe Dawn, MD  metoCLOPramide (REGLAN) 10 MG tablet Take 1 tablet (10 mg total) by mouth every 6 (six) hours. 11/30/18   Noemi Chapel, MD    Family History Family History  Problem Relation Age of Onset  . Heart disease Mother        MI in 49s  . Hyperlipidemia Mother   . Hypertension Mother   . Renal Disease Mother        insufficiency  . Early death Father        Head Injury at Work  . Hyperlipidemia Father   . Hypertension Father   . Stroke Son   . Diabetes Maternal Aunt   . Prostate cancer Paternal Uncle   . Heart disease Maternal Aunt   . Heart failure Maternal Grandfather        Died of "heart attack" in his 62s  . Heart disease Maternal Grandfather   . Breast cancer Neg Hx     Social History Social History   Tobacco Use  . Smoking status: Never Smoker  . Smokeless tobacco: Never Used  Substance Use Topics  . Alcohol use: No  . Drug  use: No     Allergies   Latex   Review of Systems Review of Systems  All other systems reviewed and are negative.    Physical Exam Updated Vital Signs BP 113/68 (BP Location: Right Arm)   Pulse (!) 59   Temp 98 F (36.7 C) (Oral)   Resp 11   SpO2 100%   Physical Exam  Constitutional: She appears well-developed and well-nourished. No distress.  HENT:  Head: Normocephalic and atraumatic.  Mouth/Throat: Oropharynx is clear and moist. No oropharyngeal exudate.  Eyes: Pupils are equal, round, and reactive to light. Conjunctivae and EOM are normal. Right eye exhibits no discharge. Left eye exhibits no discharge. No scleral icterus.  Neck: Normal range of motion. Neck supple. No JVD present. No thyromegaly present.  Cardiovascular: Normal rate, regular rhythm, normal heart sounds and intact distal pulses. Exam reveals no gallop and no friction rub.  No murmur heard. Pulmonary/Chest: Effort normal and breath sounds normal. No respiratory distress. She has no wheezes. She has no rales.  Abdominal: Soft. Bowel sounds are normal. She exhibits distension. She exhibits no mass. There is no tenderness.  The patient is very obese, she has a soft abdomen diffusely, she has mild tenderness in the mid epigastrium to the umbilicus but no tympanitic sounds to percussion.  Bowel sounds are slightly decreased  Genitourinary:  Genitourinary Comments: Chaperone present for rectal exam, normal-appearing external structures, brown stool in the rectal vault, no masses bleeding hemorrhoids or fissures  Musculoskeletal: Normal range of motion. She exhibits no edema or tenderness.  Lymphadenopathy:    She has no cervical adenopathy.  Neurological: She is alert. Coordination normal.  Skin: Skin is warm and dry. No rash noted. No erythema.  Psychiatric: She has a normal mood and affect. Her behavior is normal.  Nursing note and vitals reviewed.    ED Treatments / Results  Labs (all labs ordered are  listed, but only abnormal results are displayed) Labs Reviewed  CBC WITH DIFFERENTIAL/PLATELET - Abnormal; Notable for the following components:      Result Value   Hemoglobin 11.6 (*)    HCT 35.8 (*)    All other components within normal limits  COMPREHENSIVE METABOLIC PANEL -  Abnormal; Notable for the following components:   Sodium 131 (*)    Chloride 95 (*)    Glucose, Bld 246 (*)    BUN 5 (*)    Creatinine, Ser 1.09 (*)    Albumin 3.4 (*)    GFR calc non Af Amer 52 (*)    All other components within normal limits  CBG MONITORING, ED - Abnormal; Notable for the following components:   Glucose-Capillary 242 (*)    All other components within normal limits  LIPASE, BLOOD  URINALYSIS, ROUTINE W REFLEX MICROSCOPIC  POC OCCULT BLOOD, ED    EKG EKG Interpretation  Date/Time:  Saturday November 30 2018 17:47:57 EST Ventricular Rate:  79 PR Interval:    QRS Duration: 98 QT Interval:  426 QTC Calculation: 489 R Axis:   16 Text Interpretation:  Unknown rhythm, irregular rate Anteroseptal infarct, old Borderline T abnormalities, inferior leads since last tracing no significant change Confirmed by Noemi Chapel [phone]) on 11/30/2018 5:54:23 PM   Radiology Dg Abd Acute W/chest  Result Date: 11/30/2018 CLINICAL DATA:  Shortness of breath and vomiting. EXAM: DG ABDOMEN ACUTE W/ 1V CHEST COMPARISON:  Chest x-ray 11/25/2018 FINDINGS: The upright chest x-ray demonstrates mild stable cardiac enlargement and thoracic aortic calcifications but no acute pulmonary findings. No pleural effusion. Two views of the abdomen demonstrate scattered air in the colon but no findings for small bowel obstruction or free air. No worrisome calcifications. IMPRESSION: No acute cardiopulmonary findings.  Stable cardiac enlargement. No plain film findings for an acute abdominal process. No changes to suggest small bowel obstruction or free air. Electronically Signed   By: Marijo Sanes M.D.   On: 11/30/2018 19:15      Procedures Procedures (including critical care time)  Medications Ordered in ED Medications  ondansetron (ZOFRAN) injection 4 mg (4 mg Intravenous Given 11/30/18 1811)  sodium chloride 0.9 % bolus 1,000 mL (0 mLs Intravenous Stopped 11/30/18 2059)     Initial Impression / Assessment and Plan / ED Course  I have reviewed the triage vital signs and the nursing notes.  Pertinent labs & imaging results that were available during my care of the patient were reviewed by me and considered in my medical decision making (see chart for details).    The patient continues to have symptomatic nausea and after her multiple work-ups which have not yielded a definite answer it is likely gastroparesis.  She has follow-up with gastroenterology but she is not sure when it is, she is frustrated because she has ongoing symptoms.  She is concerned about her blood sugar stating that it is been around 130-160 today but is worried it will drop down because she is not eating or drinking.  Her exam is rather unremarkable however she does appear a bit dry in the mouth.  Will check labs to check for hydration, renal dysfunction, proteinuria, ketonuria, electrolytes and hydrate.  She is agreeable to the plan.  Primary care physician is the family practice at [facility]  The patient was reevaluated multiple times and at 9:00 PM when I last examined her she had a soft nontender abdomen, was no longer nauseated, was tolerating oral fluids and had a work-up which was overall very unremarkable except for mild hyperglycemia.  At this time the patient appears very stable for discharge, she had a negative urinalysis within the last 72 hours and has no urinary symptoms, her Hemoccult study was negative on her stool and she had no blood in the rectal vault.  I explained to her at the bedside with her family members present that she likely has gastroparesis, why she has this and why it is a problem.  She will be started on  metoclopramide 10 mg every 6 hours and encouraged to follow-up with her family doctor and the gastroenterologist.  She is in total agreement with this plan.  Final Clinical Impressions(s) / ED Diagnoses   Final diagnoses:  Non-intractable vomiting with nausea, unspecified vomiting type  Gastroparesis    ED Discharge Orders         Ordered    metoCLOPramide (REGLAN) 10 MG tablet  Every 6 hours     11/30/18 2102           Noemi Chapel, MD 11/30/18 2105

## 2018-12-01 ENCOUNTER — Encounter: Payer: Self-pay | Admitting: Family Medicine

## 2018-12-02 ENCOUNTER — Encounter (HOSPITAL_COMMUNITY): Payer: Self-pay | Admitting: *Deleted

## 2018-12-02 ENCOUNTER — Other Ambulatory Visit: Payer: Self-pay

## 2018-12-02 ENCOUNTER — Observation Stay (HOSPITAL_COMMUNITY)
Admission: EM | Admit: 2018-12-02 | Discharge: 2018-12-03 | Disposition: A | Discharge status: Home / Self Care | Payer: Medicare Other | Attending: Family Medicine | Admitting: Family Medicine

## 2018-12-02 DIAGNOSIS — Z9641 Presence of insulin pump (external) (internal): Secondary | ICD-10-CM | POA: Insufficient documentation

## 2018-12-02 DIAGNOSIS — Z7902 Long term (current) use of antithrombotics/antiplatelets: Secondary | ICD-10-CM | POA: Insufficient documentation

## 2018-12-02 DIAGNOSIS — K229 Disease of esophagus, unspecified: Secondary | ICD-10-CM | POA: Insufficient documentation

## 2018-12-02 DIAGNOSIS — R1013 Epigastric pain: Secondary | ICD-10-CM

## 2018-12-02 DIAGNOSIS — E876 Hypokalemia: Secondary | ICD-10-CM | POA: Diagnosis not present

## 2018-12-02 DIAGNOSIS — E1065 Type 1 diabetes mellitus with hyperglycemia: Secondary | ICD-10-CM | POA: Insufficient documentation

## 2018-12-02 DIAGNOSIS — E1143 Type 2 diabetes mellitus with diabetic autonomic (poly)neuropathy: Secondary | ICD-10-CM | POA: Diagnosis present

## 2018-12-02 DIAGNOSIS — Z6841 Body Mass Index (BMI) 40.0 and over, adult: Secondary | ICD-10-CM | POA: Diagnosis not present

## 2018-12-02 DIAGNOSIS — F329 Major depressive disorder, single episode, unspecified: Secondary | ICD-10-CM | POA: Insufficient documentation

## 2018-12-02 DIAGNOSIS — G8929 Other chronic pain: Secondary | ICD-10-CM

## 2018-12-02 DIAGNOSIS — M1990 Unspecified osteoarthritis, unspecified site: Secondary | ICD-10-CM | POA: Insufficient documentation

## 2018-12-02 DIAGNOSIS — K3184 Gastroparesis: Secondary | ICD-10-CM | POA: Insufficient documentation

## 2018-12-02 DIAGNOSIS — Z8673 Personal history of transient ischemic attack (TIA), and cerebral infarction without residual deficits: Secondary | ICD-10-CM | POA: Insufficient documentation

## 2018-12-02 DIAGNOSIS — R6881 Early satiety: Secondary | ICD-10-CM | POA: Insufficient documentation

## 2018-12-02 DIAGNOSIS — E785 Hyperlipidemia, unspecified: Secondary | ICD-10-CM | POA: Diagnosis not present

## 2018-12-02 DIAGNOSIS — I509 Heart failure, unspecified: Secondary | ICD-10-CM | POA: Insufficient documentation

## 2018-12-02 DIAGNOSIS — B3781 Candidal esophagitis: Secondary | ICD-10-CM | POA: Diagnosis not present

## 2018-12-02 DIAGNOSIS — R112 Nausea with vomiting, unspecified: Secondary | ICD-10-CM

## 2018-12-02 DIAGNOSIS — K219 Gastro-esophageal reflux disease without esophagitis: Secondary | ICD-10-CM | POA: Diagnosis not present

## 2018-12-02 DIAGNOSIS — I110 Hypertensive heart disease with heart failure: Secondary | ICD-10-CM | POA: Insufficient documentation

## 2018-12-02 DIAGNOSIS — Z85850 Personal history of malignant neoplasm of thyroid: Secondary | ICD-10-CM | POA: Diagnosis not present

## 2018-12-02 DIAGNOSIS — Z79899 Other long term (current) drug therapy: Secondary | ICD-10-CM | POA: Diagnosis not present

## 2018-12-02 DIAGNOSIS — E871 Hypo-osmolality and hyponatremia: Secondary | ICD-10-CM | POA: Diagnosis not present

## 2018-12-02 DIAGNOSIS — E860 Dehydration: Secondary | ICD-10-CM | POA: Diagnosis not present

## 2018-12-02 DIAGNOSIS — R739 Hyperglycemia, unspecified: Secondary | ICD-10-CM

## 2018-12-02 DIAGNOSIS — F419 Anxiety disorder, unspecified: Secondary | ICD-10-CM | POA: Diagnosis not present

## 2018-12-02 DIAGNOSIS — E1043 Type 1 diabetes mellitus with diabetic autonomic (poly)neuropathy: Secondary | ICD-10-CM | POA: Insufficient documentation

## 2018-12-02 DIAGNOSIS — Z886 Allergy status to analgesic agent status: Secondary | ICD-10-CM | POA: Insufficient documentation

## 2018-12-02 DIAGNOSIS — Z888 Allergy status to other drugs, medicaments and biological substances status: Secondary | ICD-10-CM | POA: Insufficient documentation

## 2018-12-02 LAB — COMPREHENSIVE METABOLIC PANEL
ALT: 20 U/L (ref 0–44)
AST: 23 U/L (ref 15–41)
Albumin: 3.4 g/dL — ABNORMAL LOW (ref 3.5–5.0)
Alkaline Phosphatase: 83 U/L (ref 38–126)
Anion gap: 15 (ref 5–15)
BUN: 6 mg/dL — ABNORMAL LOW (ref 8–23)
CO2: 23 mmol/L (ref 22–32)
Calcium: 9 mg/dL (ref 8.9–10.3)
Chloride: 92 mmol/L — ABNORMAL LOW (ref 98–111)
Creatinine, Ser: 1.16 mg/dL — ABNORMAL HIGH (ref 0.44–1.00)
GFR calc Af Amer: 56 mL/min — ABNORMAL LOW (ref >60)
GFR calc non Af Amer: 49 mL/min — ABNORMAL LOW (ref >60)
Glucose, Bld: 236 mg/dL — ABNORMAL HIGH (ref 70–99)
POTASSIUM: 3.7 mmol/L (ref 3.5–5.1)
Sodium: 130 mmol/L — ABNORMAL LOW (ref 135–145)
Total Bilirubin: 0.7 mg/dL (ref 0.3–1.2)
Total Protein: 6.9 g/dL (ref 6.5–8.1)

## 2018-12-02 LAB — MAGNESIUM: Magnesium: 1.9 mg/dL (ref 1.7–2.4)

## 2018-12-02 LAB — GLUCOSE, CAPILLARY
GLUCOSE-CAPILLARY: 242 mg/dL — AB (ref 70–99)
Glucose-Capillary: 150 mg/dL — ABNORMAL HIGH (ref 70–99)
Glucose-Capillary: 163 mg/dL — ABNORMAL HIGH (ref 70–99)
Glucose-Capillary: 237 mg/dL — ABNORMAL HIGH (ref 70–99)

## 2018-12-02 LAB — URINALYSIS, ROUTINE W REFLEX MICROSCOPIC
BILIRUBIN URINE: NEGATIVE
Glucose, UA: 150 mg/dL — AB
Hgb urine dipstick: NEGATIVE
KETONES UR: NEGATIVE mg/dL
Nitrite: NEGATIVE
Protein, ur: NEGATIVE mg/dL
Specific Gravity, Urine: 1.009 (ref 1.005–1.030)
pH: 6 (ref 5.0–8.0)

## 2018-12-02 LAB — CBC WITH DIFFERENTIAL/PLATELET
Abs Immature Granulocytes: 0.01 10*3/uL (ref 0.00–0.07)
Basophils Absolute: 0 10*3/uL (ref 0.0–0.1)
Basophils Relative: 1 %
EOS ABS: 0 10*3/uL (ref 0.0–0.5)
Eosinophils Relative: 1 %
HEMATOCRIT: 36.6 % (ref 36.0–46.0)
Hemoglobin: 11.1 g/dL — ABNORMAL LOW (ref 12.0–15.0)
Immature Granulocytes: 0 %
LYMPHS ABS: 1.1 10*3/uL (ref 0.7–4.0)
Lymphocytes Relative: 26 %
MCH: 27.3 pg (ref 26.0–34.0)
MCHC: 30.3 g/dL (ref 30.0–36.0)
MCV: 90.1 fL (ref 80.0–100.0)
Monocytes Absolute: 0.7 10*3/uL (ref 0.1–1.0)
Monocytes Relative: 16 %
Neutro Abs: 2.5 10*3/uL (ref 1.7–7.7)
Neutrophils Relative %: 56 %
Platelets: 293 10*3/uL (ref 150–400)
RBC: 4.06 MIL/uL (ref 3.87–5.11)
RDW: 12.2 % (ref 11.5–15.5)
WBC: 4.3 10*3/uL (ref 4.0–10.5)
nRBC: 0 % (ref 0.0–0.2)

## 2018-12-02 LAB — LIPASE, BLOOD: Lipase: 17 U/L (ref 11–51)

## 2018-12-02 LAB — CBG MONITORING, ED
Glucose-Capillary: 237 mg/dL — ABNORMAL HIGH (ref 70–99)
Glucose-Capillary: 195 mg/dL — ABNORMAL HIGH (ref 70–99)

## 2018-12-02 MED ORDER — METOCLOPRAMIDE HCL 10 MG PO TABS: 10.0000 mg | ORAL_TABLET | Freq: Four times a day (QID) | ORAL | Status: DC

## 2018-12-02 MED ORDER — DICLOFENAC SODIUM 1 % TD GEL
4.0000 g | Freq: Four times a day (QID) | TRANSDERMAL | Status: DC | PRN
  Filled 2018-12-02: qty 100

## 2018-12-02 MED ORDER — LEVOTHYROXINE SODIUM 75 MCG PO TABS
175.0000 ug | ORAL_TABLET | Freq: Every day | ORAL | Status: DC
  Administered 2018-12-03: 175 ug via ORAL
  Filled 2018-12-02 (×2): qty 1

## 2018-12-02 MED ORDER — PROCHLORPERAZINE MALEATE 5 MG PO TABS
5.0000 mg | ORAL_TABLET | Freq: Four times a day (QID) | ORAL | Status: DC | PRN
  Filled 2018-12-02: qty 1

## 2018-12-02 MED ORDER — ACETAMINOPHEN 325 MG PO TABS
650.0000 mg | ORAL_TABLET | Freq: Four times a day (QID) | ORAL | Status: DC | PRN
  Administered 2018-12-03: 650 mg via ORAL
  Filled 2018-12-02: qty 2

## 2018-12-02 MED ORDER — FAMOTIDINE 20 MG PO TABS: 20.0000 mg | ORAL_TABLET | Freq: Two times a day (BID) | ORAL | Status: DC

## 2018-12-02 MED ORDER — ONDANSETRON HCL 4 MG/2ML IJ SOLN
4.0000 mg | Freq: Once | INTRAMUSCULAR | Status: AC
  Administered 2018-12-02: 4 mg via INTRAVENOUS
  Filled 2018-12-02: qty 2

## 2018-12-02 MED ORDER — LOSARTAN POTASSIUM 50 MG PO TABS
100.0000 mg | ORAL_TABLET | Freq: Every day | ORAL | Status: DC
  Administered 2018-12-02 – 2018-12-03 (×2): 100 mg via ORAL
  Filled 2018-12-02 (×3): qty 2

## 2018-12-02 MED ORDER — LOSARTAN POTASSIUM-HCTZ 100-12.5 MG PO TABS: 1.0000 | ORAL_TABLET | Freq: Every day | ORAL | Status: DC

## 2018-12-02 MED ORDER — ACETAMINOPHEN 650 MG RE SUPP: 650.0000 mg | Freq: Four times a day (QID) | RECTAL | Status: DC | PRN

## 2018-12-02 MED ORDER — HYDROCHLOROTHIAZIDE 12.5 MG PO CAPS
12.5000 mg | ORAL_CAPSULE | Freq: Every day | ORAL | Status: DC
  Administered 2018-12-02: 12.5 mg via ORAL
  Filled 2018-12-02: qty 1

## 2018-12-02 MED ORDER — CLOPIDOGREL BISULFATE 75 MG PO TABS: 75.0000 mg | ORAL_TABLET | Freq: Every day | ORAL | Status: DC

## 2018-12-02 MED ORDER — FLUOXETINE HCL 20 MG PO TABS
10.0000 mg | ORAL_TABLET | Freq: Every day | ORAL | Status: DC
  Filled 2018-12-02: qty 1

## 2018-12-02 MED ORDER — SODIUM CHLORIDE 0.9 % IV BOLUS
1000.0000 mL | Freq: Once | INTRAVENOUS | Status: AC
  Administered 2018-12-02: 1000 mL via INTRAVENOUS

## 2018-12-02 MED ORDER — PANTOPRAZOLE SODIUM 40 MG IV SOLR
40.0000 mg | Freq: Two times a day (BID) | INTRAVENOUS | Status: DC
  Administered 2018-12-02 – 2018-12-03 (×2): 40 mg via INTRAVENOUS
  Filled 2018-12-02 (×2): qty 40

## 2018-12-02 MED ORDER — ATORVASTATIN CALCIUM 80 MG PO TABS
80.0000 mg | ORAL_TABLET | Freq: Every day | ORAL | Status: DC
  Administered 2018-12-02: 80 mg via ORAL
  Filled 2018-12-02: qty 1

## 2018-12-02 MED ORDER — SODIUM CHLORIDE 0.9 % IV SOLN
INTRAVENOUS | Status: DC
  Administered 2018-12-02 – 2018-12-03 (×2): via INTRAVENOUS

## 2018-12-02 MED ORDER — FLUOXETINE HCL 10 MG PO CAPS
10.0000 mg | ORAL_CAPSULE | Freq: Every day | ORAL | Status: DC
  Administered 2018-12-03: 10 mg via ORAL
  Filled 2018-12-02 (×2): qty 1

## 2018-12-02 MED ORDER — METOPROLOL SUCCINATE ER 50 MG PO TB24
50.0000 mg | ORAL_TABLET | Freq: Every day | ORAL | Status: DC
Start: 2018-12-02 — End: 2018-12-03
  Administered 2018-12-02 – 2018-12-03 (×2): 50 mg via ORAL
  Filled 2018-12-02 (×3): qty 1

## 2018-12-02 MED ORDER — ENOXAPARIN SODIUM 40 MG/0.4ML ~~LOC~~ SOLN: 40.0000 mg | SUBCUTANEOUS | Status: DC

## 2018-12-02 NOTE — Consult Note (Addendum)
[facility] Consult: 1:37 PM 12/02/2018  LOS: 0 days    Referring Provider: Dr Ardelia Mems in ED  Primary Care Physician:  Shirley, [name], DO Primary Gastroenterologist:  Dr. Carlean Purl    Reason for Consultation:  Unexplained nausea and vomiting.     HPI: [name] is a 67 y.o. female.  PMH Morbid obesity, BMI 41, 122 kg.  type 1 DM on insulin pump.  Htn.  Hld.  GERD.  CHF.  Anemia.  Arthritis.  Hypothyroidism following 2006 thyroidectomy and radioactive iodine therapy to treat thyroid cancer.  Benign paroxysmal, positional vertigo.  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  30-day cardiac event monitor monitor started 11/4 and to be completed on 11/27/2018.  She had been on Plavix which was continued along with DAPT for 3 weeks and then single agent alone thereafter.. Surgeries include hysterectomy, heart catheterization, ankle surgery, thyroidectomy.  08/2014 Colonoscopy.  Average risk screening study.  Normal study on good bowel prep.  Within about a week of returning home from her stroke, about the first week of November, the patient developed GI symptoms. Several visits to ED (currently visit # 5) since 11/23 with GI complaints including lower abdominal pain, bloating, constipation , nonbloody emesis.  Repeated extensive laboratory testing, x-ray and CT imaging unrevealing, though labs revealed hyponatremia, hypokalemia, hypochloremia attributed to mild dehydration. During ED visits symptoms temporarily improved with Pepcid, Reglan, GI cocktails. Medications added for her symptoms include Protonix, taking this twice daily, metoclopramide 4 times a day; neither of these has helped significantly.  She can tolerate some  limited amount of liquids but not solids.  Emesis consists of food she may have tried to swallow, nonbloody bilious material.  She vomits once or twice a day but has chronic nausea.  She has lost at least 15 pounds.  New/adjusted medications include metoprolol, added after the stroke and increased dose of atorvastatin. Her PMD suspected gastroparesis.  GI appointment set for January 2020 She never had similar symptoms before the stroke.  She denies vertigo symptoms although does recall having had that more than a year ago.  Since the stroke she has been able to ambulate with a rolling walker but with this prolonged GI symptomatology, she is become weak and somewhat fatigued.  She has not noticed any irregularity to her heartbeat.  Residual stroke symptoms consist of altered sensation, numbness on her left extremities but no significant weakness on the left.  11/16/2018 CT scan abdomen pelvis with contrast.  Small, subpleural pulmonary nodules, likely representing lymph nodes.  Liver, gallbladder, pancreas, spleen, stomach, intestine all normal.  Aortic atherosclerosis.  S/p hysterectomy.  No hernias.  No ascites.  L2 vertebral compression, possibly acute.   11/30/2018 AAS with chest.  Nothing acute to explain vomiting.   LFTs and Lipase normal on multiple occasions.  .  Returns to ED today and staff plans to admit to hospitalist service.  Patient lives alone and has not been able to take care of herself.  She has continued  nausea and vomiting.  Pain is located in the epigastric area and is moderate at worst.  Since starting the Reglan she is been having 1 and sometimes 2 brown stools a day.  A couple of days ago she actually had several episodes of diarrhea but this has resolved.  She has never seen blood in her emesis. Her creatinine has bumped a little bit from 1 to 1.6.  Full chemistries again unrevealing.  Glucose in the mid 200s.    Past Medical History:  Diagnosis Date  . Anemia   . Arthritis     . CHF (congestive heart failure) ([place])   . Depression   . GERD (gastroesophageal reflux disease)   . Hemorrhoids   . Hyperlipidemia   . Hypertension   . Hypothyroidism   . Seasonal allergies   . Stroke ([place])   . Thyroid cancer ([place])    per pt S/p Total Thyroidectomy with Radioactive Iodine Therapy  . Type 1 diabetes mellitus ([place])     Past Surgical History:  Procedure Laterality Date  . ABDOMINAL ANGIOGRAM N/A 05/14/2012   Procedure: ABDOMINAL ANGIOGRAM;  Surgeon: Laverda Page, MD;  Location: [facility] CATH LAB;  Service: Cardiovascular;  Laterality: N/A;  . ABDOMINAL HYSTERECTOMY  1995   partial  . ANKLE FRACTURE SURGERY Right   . CARDIAC CATHETERIZATION     with coronary angiogram  . COLONOSCOPY N/A 09/09/2014   Procedure: COLONOSCOPY;  Surgeon: Gatha Mayer, MD;  Location: [facility];  Service: Endoscopy;  Laterality: N/A;  . LEFT AND RIGHT HEART CATHETERIZATION WITH CORONARY ANGIOGRAM N/A 05/14/2012   Procedure: LEFT AND RIGHT HEART CATHETERIZATION WITH CORONARY ANGIOGRAM;  Surgeon: Laverda Page, MD;  Location: [facility] CATH LAB;  Service: Cardiovascular;  Laterality: N/A;  . MINI METER   09/08/2014   ELECTRONIC INSULIN PUMP  . TEE WITHOUT CARDIOVERSION N/A 10/16/2018   Procedure: TRANSESOPHAGEAL ECHOCARDIOGRAM (TEE);  Surgeon: Buford Dresser, MD;  Location: [facility] ENDOSCOPY;  Service: Cardiovascular;  Laterality: N/A;  . THYROIDECTOMY  2006    Prior to Admission medications   Medication Sig Start Date End Date Taking? Authorizing Provider  acetaminophen (TYLENOL) 500 MG tablet Take 1,000 mg by mouth as needed for mild pain.   Yes [provider]  atorvastatin (LIPITOR) 80 MG tablet TAKE 1 TABLET BY MOUTH DAILY Patient taking differently: Take 80 mg by mouth daily at 6 PM.  10/17/18  Yes Enid Derry, [name], DO  clopidogrel (PLAVIX) 75 MG tablet TAKE 1 TABLET BY MOUTH EVERY DAY Patient taking differently: Take 75 mg by mouth daily.  07/15/18  Yes Enid Derry, [name], DO   CRANBERRY PO Take 1 tablet by mouth daily.   Yes [provider]  diclofenac sodium (VOLTAREN) 1 % GEL Apply 4 g topically 4 (four) times daily. 11/11/18  Yes Enid Derry, [name], DO  fexofenadine (ALLEGRA) 30 MG tablet Take 30 mg by mouth daily as needed (allergies).    Yes [provider]  FLUoxetine (PROZAC) 10 MG tablet Take 1 tablet (10 mg total) by mouth daily. 10/29/18  Yes Enid Derry, [name], DO  Insulin Human (INSULIN PUMP) SOLN Inject into the skin continuous. humalog insulin   Yes [provider]  levothyroxine (SYNTHROID, LEVOTHROID) 175 MCG tablet Take 175 mcg by mouth daily. 06/06/17  Yes [provider]  losartan-hydrochlorothiazide (HYZAAR) 100-12.5 MG tablet Take 1 tablet by mouth daily. 01/30/18  Yes Guadalupe Dawn, MD  metoCLOPramide (REGLAN) 10 MG tablet Take 1 tablet (10 mg total) by mouth every 6 (six) hours. 11/30/18  Yes Noemi Chapel, MD  metoprolol succinate (TOPROL-XL) 50 MG 24 hr tablet TAKE 1 TABLET BY MOUTH DAILY Patient taking differently: Take 50 mg by mouth daily.  10/17/18  Yes Enid Derry, [name], DO  pantoprazole (PROTONIX) 40 MG tablet TAKE 1 TABLET BY MOUTH TWICE DAILY, BEFORE MEALS Patient taking differently: Take 40 mg by mouth 2 (two) times daily.  11/11/18  Yes Enid Derry, [name], DO  ranitidine (ZANTAC) 150 MG tablet TAKE 1 TABLET BY MOUTH TWICE DAILY Patient taking differently: Take 150 mg by mouth 2 (two) times daily.  09/23/18  Yes Enid Derry, [name], DO  simethicone (GAS-X) 80 MG chewable tablet Chew 1 tablet (80 mg total) by mouth every 6 (six) hours as needed for flatulence. 11/16/18  Yes Montine Circle, PA-C  alum & mag hydroxide-simeth (MAALOX/MYLANTA) 200-200-20 MG/5ML suspension Take 15 mLs by mouth every 6 (six) hours as needed for indigestion or heartburn. Patient not taking: Reported on 12/02/2018 11/25/18   Larene Pickett, PA-C  clopidogrel (PLAVIX) 75 MG tablet Take 1 tablet (75 mg total) by mouth daily. Patient not taking:  Reported on 11/30/2018 10/17/18   Guadalupe Dawn, MD  famotidine (PEPCID) 20 MG tablet Take 1 tablet (20 mg total) by mouth 2 (two) times daily. Patient not taking: Reported on 12/02/2018 11/25/18   Larene Pickett, PA-C    Scheduled Meds:  Infusions: . sodium chloride     PRN Meds: prochlorperazine   Allergies as of 12/02/2018 - Review Complete 12/02/2018  Allergen Reaction Noted  . Latex Rash 11/25/2018    Family History  Problem Relation Age of Onset  . Heart disease Mother        MI in 36s  . Hyperlipidemia Mother   . Hypertension Mother   . Renal Disease Mother        insufficiency  . Early death Father        Head Injury at Work  . Hyperlipidemia Father   . Hypertension Father   . Stroke Son   . Diabetes Maternal Aunt   . Prostate cancer Paternal Uncle   . Heart disease Maternal Aunt   . Heart failure Maternal Grandfather        Died of "heart attack" in his 52s  . Heart disease Maternal Grandfather   . Breast cancer Neg Hx     Social History   Socioeconomic History  . Marital status: Divorced    Spouse name: Not on file  . Number of children: 1  . Years of education: Not on file  . Highest education level: Not on file  Occupational History  . Occupation: retired Tour manager  . Financial resource strain: Not on file  . Food insecurity:    Worry: Not on file    Inability: Not on file  . Transportation needs:    Medical: Not on file    Non-medical: Not on file  Tobacco Use  . Smoking status: Never Smoker  . Smokeless tobacco: Never Used  Substance and Sexual Activity  . Alcohol use: No  . Drug use: No  . Sexual activity: Not on file  Lifestyle  . Physical activity:    Days per week: Not on file    Minutes per session: Not on file  . Stress: Not on file  Relationships  . Social connections:    Talks on phone: Not on file    Gets together: Not on file    Attends religious service: Not on file    Active member of  club or  organization: Not on file    Attends meetings of clubs or organizations: Not on file    Relationship status: Not on file  . Intimate partner violence:    Fear of current or ex partner: Not on file    Emotionally abused: Not on file    Physically abused: Not on file    Forced sexual activity: Not on file  Other Topics Concern  . Not on file  Social History Narrative   Retired Pharmacist, hospital, high school teacher taught mass. One son, helping to care for her granddaughter. No caffeine. Updated 07/20/2014.    REVIEW OF SYSTEMS: Constitutional:  Per HPI ENT:  No nose bleeds Pulm: No shortness of breath or cough. CV:  No chest pain, no sense of irregular heartbeat, no LE edema.  GU: Relative oliguria compared to her baseline.  No discoloration to the urine. GI:  Per HPI Heme: Denies unusual or excessive bleeding or bruising Transfusions:  none Neuro: No seizures.  No syncope.  No gait disturbance. Derm:  No itching, no rash or sores.  Endocrine:  No sweats or chills.  No polyuria or dysuria Immunization: Reviewed.  I do not see documentation of flu vaccination for this current flu season. Travel:  None beyond local counties in last few months.    PHYSICAL EXAM: Vital signs in last 24 hours: Vitals:   12/02/18 0915 12/02/18 0930  BP: (!) 144/47   Pulse: (!) 52 (!) 56  Resp:    Temp:    SpO2: 94% 100%   Wt Readings from Last 3 Encounters:  11/27/18 121.6 kg  11/25/18 122.9 kg  11/19/18 123.4 kg    General: Pleasant older AAF who looks better than expected.  Sitting up in the wheelchair alert and comfortable. Head: No facial asymmetry or swelling.  No signs of head trauma. Eyes: No scleral icterus or conjunctival pallor.  EOMI. Ears: Not HOH Nose: No discharge or congestion Mouth: Tongue midline.  Oral mucosa pink, moist, clear.  Three quarters of her teeth are gone, remaining teeth are in the front lower jaw.  She is not wearing her dentures. Neck: No JVD, no masses, no  thyromegaly Lungs: Good breath sounds bilaterally.  No adventitious sounds, no cough. Heart: Irregularly irregular, rate not accelerated or bradycardic.  No MRG. Abdomen: Soft.  No masses or organomegaly.  No bruits, no hernias.  Minor/mild epigastric pain without guarding or rebound.  Active bowel sounds..   Rectal: Deferred Musc/Skeltl: No joint redness, swelling or gross deformity. Extremities: No CCE. Neurologic: Alert.  Good historian.  Fluid speech.  Oriented x3.  Moves all 4 limbs, strength grossly 5/5 in all 4 limbs.  No tremors.  No obvious deficits. Skin: No significant rashes, sores, or bruising. Tattoos: None observed Nodes: No cervical adenopathy. Psych: Pleasant, calm, cooperative.  Intake/Output from previous day: No intake/output data recorded. Intake/Output this shift: Total I/O In: 1000 [IV Piggyback:1000] Out: -   LAB RESULTS: Recent Labs    11/30/18 1754 12/02/18 1005  WBC 5.0 4.3  HGB 11.6* 11.1*  HCT 35.8* 36.6  PLT 262 293   BMET Lab Results  Component Value Date   NA 130 (L) 12/02/2018   NA 131 (L) 11/30/2018   NA 131 (L) 11/27/2018   K 3.7 12/02/2018   K 3.9 11/30/2018   K 3.4 (L) 11/27/2018   CL 92 (L) 12/02/2018   CL 95 (L) 11/30/2018   CL 94 (L) 11/27/2018   CO2 23 12/02/2018  CO2 23 11/30/2018   CO2 25 11/27/2018   GLUCOSE 236 (H) 12/02/2018   GLUCOSE 246 (H) 11/30/2018   GLUCOSE 114 (H) 11/27/2018   BUN 6 (L) 12/02/2018   BUN 5 (L) 11/30/2018   BUN <5 (L) 11/27/2018   CREATININE 1.16 (H) 12/02/2018   CREATININE 1.09 (H) 11/30/2018   CREATININE 0.99 11/27/2018   CALCIUM 9.0 12/02/2018   CALCIUM 9.1 11/30/2018   CALCIUM 9.3 11/27/2018   LFT Recent Labs    11/30/18 1754 12/02/18 1005  PROT 6.6 6.9  ALBUMIN 3.4* 3.4*  AST 25 23  ALT 22 20  ALKPHOS 82 83  BILITOT 0.7 0.7   PT/INR Lab Results  Component Value Date   INR 1.03 10/13/2018   INR 1.02 08/29/2015   Hepatitis Panel No results for input(s): HEPBSAG, HCVAB,  HEPAIGM, HEPBIGM in the last 72 hours. C-Diff No components found for: CDIFF Lipase     Component Value Date/Time   LIPASE 17 12/02/2018 1005    Drugs of Abuse     Component Value Date/Time   LABOPIA NONE DETECTED 10/14/2018 0642   COCAINSCRNUR NONE DETECTED 10/14/2018 0642   LABBENZ NONE DETECTED 10/14/2018 0642   AMPHETMU NONE DETECTED 10/14/2018 0642   THCU NONE DETECTED 10/14/2018 0642   LABBARB NONE DETECTED 10/14/2018 1856     RADIOLOGY STUDIES: Dg Abd Acute W/chest  Result Date: 11/30/2018 CLINICAL DATA:  Shortness of breath and vomiting. EXAM: DG ABDOMEN ACUTE W/ 1V CHEST COMPARISON:  Chest x-ray 11/25/2018 FINDINGS: The upright chest x-ray demonstrates mild stable cardiac enlargement and thoracic aortic calcifications but no acute pulmonary findings. No pleural effusion. Two views of the abdomen demonstrate scattered air in the colon but no findings for small bowel obstruction or free air. No worrisome calcifications. IMPRESSION: No acute cardiopulmonary findings.  Stable cardiac enlargement. No plain film findings for an acute abdominal process. No changes to suggest small bowel obstruction or free air. Electronically Signed   By: Marijo Sanes M.D.   On: 11/30/2018 19:15     IMPRESSION:   *  Unexplained nausea and vomiting.  Has not responded to acid controlling medication, Protonix or to prokinetic agent metoclopramide.  Rule out ulcer disease, rule out gastroparesis.  Rule out medication side effect, however metoprolol is not well known for causing nausea or vomiting, atorvastatin is linked to nausea and the dose had recently been increased.  *   Recent CVA.  On Plavix.    *   IDDM  *   Hypothyroidism.  TSH normal.     PLAN:     *   EGD tomorrow.  ~ 3149 with Dr Loletha Carrow.    *   Is it okay to hold Plavix for now? Continue twice daily Protonix.  I would stop the metoclopramide because it is not helping  *   Ordered EKG in order to define patient's current rhythm  given arrhythmia on my physical exam.     Azucena Freed  12/02/2018, 1:37 PM Phone [phone]

## 2018-12-02 NOTE — ED Notes (Signed)
Pt refusing to wear cardiac monitor

## 2018-12-02 NOTE — H&P (View-Only) (Signed)
[facility] Consult: 1:37 PM 12/02/2018  LOS: 0 days    Referring Provider: Dr Ardelia Mems in ED  Primary Care Physician:  Shirley, [name], DO Primary Gastroenterologist:  Dr. Carlean Purl    Reason for Consultation:  Unexplained nausea and vomiting.     HPI: [name] is a 67 y.o. female.  PMH Morbid obesity, BMI 41, 122 kg.  type 1 DM on insulin pump.  Htn.  Hld.  GERD.  CHF.  Anemia.  Arthritis.  Hypothyroidism following 2006 thyroidectomy and radioactive iodine therapy to treat thyroid cancer.  Benign paroxysmal, positional vertigo.  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  30-day cardiac event monitor monitor started 11/4 and to be completed on 11/27/2018.  She had been on Plavix which was continued along with DAPT for 3 weeks and then single agent alone thereafter.. Surgeries include hysterectomy, heart catheterization, ankle surgery, thyroidectomy.  08/2014 Colonoscopy.  Average risk screening study.  Normal study on good bowel prep.  Within about a week of returning home from her stroke, about the first week of November, the patient developed GI symptoms. Several visits to ED (currently visit # 5) since 11/23 with GI complaints including lower abdominal pain, bloating, constipation , nonbloody emesis.  Repeated extensive laboratory testing, x-ray and CT imaging unrevealing, though labs revealed hyponatremia, hypokalemia, hypochloremia attributed to mild dehydration. During ED visits symptoms temporarily improved with Pepcid, Reglan, GI cocktails. Medications added for her symptoms include Protonix, taking this twice daily, metoclopramide 4 times a day; neither of these has helped significantly.  She can tolerate some  limited amount of liquids but not solids.  Emesis consists of food she may have tried to swallow, nonbloody bilious material.  She vomits once or twice a day but has chronic nausea.  She has lost at least 15 pounds.  New/adjusted medications include metoprolol, added after the stroke and increased dose of atorvastatin. Her PMD suspected gastroparesis.  GI appointment set for January 2020 She never had similar symptoms before the stroke.  She denies vertigo symptoms although does recall having had that more than a year ago.  Since the stroke she has been able to ambulate with a rolling walker but with this prolonged GI symptomatology, she is become weak and somewhat fatigued.  She has not noticed any irregularity to her heartbeat.  Residual stroke symptoms consist of altered sensation, numbness on her left extremities but no significant weakness on the left.  11/16/2018 CT scan abdomen pelvis with contrast.  Small, subpleural pulmonary nodules, likely representing lymph nodes.  Liver, gallbladder, pancreas, spleen, stomach, intestine all normal.  Aortic atherosclerosis.  S/p hysterectomy.  No hernias.  No ascites.  L2 vertebral compression, possibly acute.   11/30/2018 AAS with chest.  Nothing acute to explain vomiting.   LFTs and Lipase normal on multiple occasions.  .  Returns to ED today and staff plans to admit to hospitalist service.  Patient lives alone and has not been able to take care of herself.  She has continued  nausea and vomiting.  Pain is located in the epigastric area and is moderate at worst.  Since starting the Reglan she is been having 1 and sometimes 2 brown stools a day.  A couple of days ago she actually had several episodes of diarrhea but this has resolved.  She has never seen blood in her emesis. Her creatinine has bumped a little bit from 1 to 1.6.  Full chemistries again unrevealing.  Glucose in the mid 200s.    Past Medical History:  Diagnosis Date  . Anemia   . Arthritis     . CHF (congestive heart failure) ([place])   . Depression   . GERD (gastroesophageal reflux disease)   . Hemorrhoids   . Hyperlipidemia   . Hypertension   . Hypothyroidism   . Seasonal allergies   . Stroke ([place])   . Thyroid cancer ([place])    per pt S/p Total Thyroidectomy with Radioactive Iodine Therapy  . Type 1 diabetes mellitus ([place])     Past Surgical History:  Procedure Laterality Date  . ABDOMINAL ANGIOGRAM N/A 05/14/2012   Procedure: ABDOMINAL ANGIOGRAM;  Surgeon: Laverda Page, MD;  Location: [facility] CATH LAB;  Service: Cardiovascular;  Laterality: N/A;  . ABDOMINAL HYSTERECTOMY  1995   partial  . ANKLE FRACTURE SURGERY Right   . CARDIAC CATHETERIZATION     with coronary angiogram  . COLONOSCOPY N/A 09/09/2014   Procedure: COLONOSCOPY;  Surgeon: Gatha Mayer, MD;  Location: [place];  Service: Endoscopy;  Laterality: N/A;  . LEFT AND RIGHT HEART CATHETERIZATION WITH CORONARY ANGIOGRAM N/A 05/14/2012   Procedure: LEFT AND RIGHT HEART CATHETERIZATION WITH CORONARY ANGIOGRAM;  Surgeon: Laverda Page, MD;  Location: [facility] CATH LAB;  Service: Cardiovascular;  Laterality: N/A;  . MINI METER   09/08/2014   ELECTRONIC INSULIN PUMP  . TEE WITHOUT CARDIOVERSION N/A 10/16/2018   Procedure: TRANSESOPHAGEAL ECHOCARDIOGRAM (TEE);  Surgeon: Buford Dresser, MD;  Location: [facility] ENDOSCOPY;  Service: Cardiovascular;  Laterality: N/A;  . THYROIDECTOMY  2006    Prior to Admission medications   Medication Sig Start Date End Date Taking? Authorizing Provider  acetaminophen (TYLENOL) 500 MG tablet Take 1,000 mg by mouth as needed for mild pain.   Yes [provider]  atorvastatin (LIPITOR) 80 MG tablet TAKE 1 TABLET BY MOUTH DAILY Patient taking differently: Take 80 mg by mouth daily at 6 PM.  10/17/18  Yes Enid Derry, [name], DO  clopidogrel (PLAVIX) 75 MG tablet TAKE 1 TABLET BY MOUTH EVERY DAY Patient taking differently: Take 75 mg by mouth daily.  07/15/18  Yes Enid Derry, [name], DO   CRANBERRY PO Take 1 tablet by mouth daily.   Yes [provider]  diclofenac sodium (VOLTAREN) 1 % GEL Apply 4 g topically 4 (four) times daily. 11/11/18  Yes Enid Derry, [name], DO  fexofenadine (ALLEGRA) 30 MG tablet Take 30 mg by mouth daily as needed (allergies).    Yes [provider]  FLUoxetine (PROZAC) 10 MG tablet Take 1 tablet (10 mg total) by mouth daily. 10/29/18  Yes Enid Derry, [name], DO  Insulin Human (INSULIN PUMP) SOLN Inject into the skin continuous. humalog insulin   Yes [provider]  levothyroxine (SYNTHROID, LEVOTHROID) 175 MCG tablet Take 175 mcg by mouth daily. 06/06/17  Yes [provider]  losartan-hydrochlorothiazide (HYZAAR) 100-12.5 MG tablet Take 1 tablet by mouth daily. 01/30/18  Yes Guadalupe Dawn, MD  metoCLOPramide (REGLAN) 10 MG tablet Take 1 tablet (10 mg total) by mouth every 6 (six) hours. 11/30/18  Yes Noemi Chapel, MD  metoprolol succinate (TOPROL-XL) 50 MG 24 hr tablet TAKE 1 TABLET BY MOUTH DAILY Patient taking differently: Take 50 mg by mouth daily.  10/17/18  Yes Enid Derry, [name], DO  pantoprazole (PROTONIX) 40 MG tablet TAKE 1 TABLET BY MOUTH TWICE DAILY, BEFORE MEALS Patient taking differently: Take 40 mg by mouth 2 (two) times daily.  11/11/18  Yes Enid Derry, [name], DO  ranitidine (ZANTAC) 150 MG tablet TAKE 1 TABLET BY MOUTH TWICE DAILY Patient taking differently: Take 150 mg by mouth 2 (two) times daily.  09/23/18  Yes Enid Derry, [name], DO  simethicone (GAS-X) 80 MG chewable tablet Chew 1 tablet (80 mg total) by mouth every 6 (six) hours as needed for flatulence. 11/16/18  Yes Montine Circle, PA-C  alum & mag hydroxide-simeth (MAALOX/MYLANTA) 200-200-20 MG/5ML suspension Take 15 mLs by mouth every 6 (six) hours as needed for indigestion or heartburn. Patient not taking: Reported on 12/02/2018 11/25/18   Larene Pickett, PA-C  clopidogrel (PLAVIX) 75 MG tablet Take 1 tablet (75 mg total) by mouth daily. Patient not taking:  Reported on 11/30/2018 10/17/18   Guadalupe Dawn, MD  famotidine (PEPCID) 20 MG tablet Take 1 tablet (20 mg total) by mouth 2 (two) times daily. Patient not taking: Reported on 12/02/2018 11/25/18   Larene Pickett, PA-C    Scheduled Meds:  Infusions: . sodium chloride     PRN Meds: prochlorperazine   Allergies as of 12/02/2018 - Review Complete 12/02/2018  Allergen Reaction Noted  . Latex Rash 11/25/2018    Family History  Problem Relation Age of Onset  . Heart disease Mother        MI in 4s  . Hyperlipidemia Mother   . Hypertension Mother   . Renal Disease Mother        insufficiency  . Early death Father        Head Injury at Work  . Hyperlipidemia Father   . Hypertension Father   . Stroke Son   . Diabetes Maternal Aunt   . Prostate cancer Paternal Uncle   . Heart disease Maternal Aunt   . Heart failure Maternal Grandfather        Died of "heart attack" in his 77s  . Heart disease Maternal Grandfather   . Breast cancer Neg Hx     Social History   Socioeconomic History  . Marital status: Divorced    Spouse name: Not on file  . Number of children: 1  . Years of education: Not on file  . Highest education level: Not on file  Occupational History  . Occupation: retired Tour manager  . Financial resource strain: Not on file  . Food insecurity:    Worry: Not on file    Inability: Not on file  . Transportation needs:    Medical: Not on file    Non-medical: Not on file  Tobacco Use  . Smoking status: Never Smoker  . Smokeless tobacco: Never Used  Substance and Sexual Activity  . Alcohol use: No  . Drug use: No  . Sexual activity: Not on file  Lifestyle  . Physical activity:    Days per week: Not on file    Minutes per session: Not on file  . Stress: Not on file  Relationships  . Social connections:    Talks on phone: Not on file    Gets together: Not on file    Attends religious service: Not on file    Active member of  club or  organization: Not on file    Attends meetings of clubs or organizations: Not on file    Relationship status: Not on file  . Intimate partner violence:    Fear of current or ex partner: Not on file    Emotionally abused: Not on file    Physically abused: Not on file    Forced sexual activity: Not on file  Other Topics Concern  . Not on file  Social History Narrative   Retired Pharmacist, hospital, high school teacher taught mass. One son, helping to care for her granddaughter. No caffeine. Updated 07/20/2014.    REVIEW OF SYSTEMS: Constitutional:  Per HPI ENT:  No nose bleeds Pulm: No shortness of breath or cough. CV:  No chest pain, no sense of irregular heartbeat, no LE edema.  GU: Relative oliguria compared to her baseline.  No discoloration to the urine. GI:  Per HPI Heme: Denies unusual or excessive bleeding or bruising Transfusions:  none Neuro: No seizures.  No syncope.  No gait disturbance. Derm:  No itching, no rash or sores.  Endocrine:  No sweats or chills.  No polyuria or dysuria Immunization: Reviewed.  I do not see documentation of flu vaccination for this current flu season. Travel:  None beyond local counties in last few months.    PHYSICAL EXAM: Vital signs in last 24 hours: Vitals:   12/02/18 0915 12/02/18 0930  BP: (!) 144/47   Pulse: (!) 52 (!) 56  Resp:    Temp:    SpO2: 94% 100%   Wt Readings from Last 3 Encounters:  11/27/18 121.6 kg  11/25/18 122.9 kg  11/19/18 123.4 kg    General: Pleasant older AAF who looks better than expected.  Sitting up in the wheelchair alert and comfortable. Head: No facial asymmetry or swelling.  No signs of head trauma. Eyes: No scleral icterus or conjunctival pallor.  EOMI. Ears: Not HOH Nose: No discharge or congestion Mouth: Tongue midline.  Oral mucosa pink, moist, clear.  Three quarters of her teeth are gone, remaining teeth are in the front lower jaw.  She is not wearing her dentures. Neck: No JVD, no masses, no  thyromegaly Lungs: Good breath sounds bilaterally.  No adventitious sounds, no cough. Heart: Irregularly irregular, rate not accelerated or bradycardic.  No MRG. Abdomen: Soft.  No masses or organomegaly.  No bruits, no hernias.  Minor/mild epigastric pain without guarding or rebound.  Active bowel sounds..   Rectal: Deferred Musc/Skeltl: No joint redness, swelling or gross deformity. Extremities: No CCE. Neurologic: Alert.  Good historian.  Fluid speech.  Oriented x3.  Moves all 4 limbs, strength grossly 5/5 in all 4 limbs.  No tremors.  No obvious deficits. Skin: No significant rashes, sores, or bruising. Tattoos: None observed Nodes: No cervical adenopathy. Psych: Pleasant, calm, cooperative.  Intake/Output from previous day: No intake/output data recorded. Intake/Output this shift: Total I/O In: 1000 [IV Piggyback:1000] Out: -   LAB RESULTS: Recent Labs    11/30/18 1754 12/02/18 1005  WBC 5.0 4.3  HGB 11.6* 11.1*  HCT 35.8* 36.6  PLT 262 293   BMET Lab Results  Component Value Date   NA 130 (L) 12/02/2018   NA 131 (L) 11/30/2018   NA 131 (L) 11/27/2018   K 3.7 12/02/2018   K 3.9 11/30/2018   K 3.4 (L) 11/27/2018   CL 92 (L) 12/02/2018   CL 95 (L) 11/30/2018   CL 94 (L) 11/27/2018   CO2 23 12/02/2018  CO2 23 11/30/2018   CO2 25 11/27/2018   GLUCOSE 236 (H) 12/02/2018   GLUCOSE 246 (H) 11/30/2018   GLUCOSE 114 (H) 11/27/2018   BUN 6 (L) 12/02/2018   BUN 5 (L) 11/30/2018   BUN <5 (L) 11/27/2018   CREATININE 1.16 (H) 12/02/2018   CREATININE 1.09 (H) 11/30/2018   CREATININE 0.99 11/27/2018   CALCIUM 9.0 12/02/2018   CALCIUM 9.1 11/30/2018   CALCIUM 9.3 11/27/2018   LFT Recent Labs    11/30/18 1754 12/02/18 1005  PROT 6.6 6.9  ALBUMIN 3.4* 3.4*  AST 25 23  ALT 22 20  ALKPHOS 82 83  BILITOT 0.7 0.7   PT/INR Lab Results  Component Value Date   INR 1.03 10/13/2018   INR 1.02 08/29/2015   Hepatitis Panel No results for input(s): HEPBSAG, HCVAB,  HEPAIGM, HEPBIGM in the last 72 hours. C-Diff No components found for: CDIFF Lipase     Component Value Date/Time   LIPASE 17 12/02/2018 1005    Drugs of Abuse     Component Value Date/Time   LABOPIA NONE DETECTED 10/14/2018 0642   COCAINSCRNUR NONE DETECTED 10/14/2018 0642   LABBENZ NONE DETECTED 10/14/2018 0642   AMPHETMU NONE DETECTED 10/14/2018 0642   THCU NONE DETECTED 10/14/2018 0642   LABBARB NONE DETECTED 10/14/2018 5784     RADIOLOGY STUDIES: Dg Abd Acute W/chest  Result Date: 11/30/2018 CLINICAL DATA:  Shortness of breath and vomiting. EXAM: DG ABDOMEN ACUTE W/ 1V CHEST COMPARISON:  Chest x-ray 11/25/2018 FINDINGS: The upright chest x-ray demonstrates mild stable cardiac enlargement and thoracic aortic calcifications but no acute pulmonary findings. No pleural effusion. Two views of the abdomen demonstrate scattered air in the colon but no findings for small bowel obstruction or free air. No worrisome calcifications. IMPRESSION: No acute cardiopulmonary findings.  Stable cardiac enlargement. No plain film findings for an acute abdominal process. No changes to suggest small bowel obstruction or free air. Electronically Signed   By: Marijo Sanes M.D.   On: 11/30/2018 19:15     IMPRESSION:   *  Unexplained nausea and vomiting.  Has not responded to acid controlling medication, Protonix or to prokinetic agent metoclopramide.  Rule out ulcer disease, rule out gastroparesis.  Rule out medication side effect, however metoprolol is not well known for causing nausea or vomiting, atorvastatin is linked to nausea and the dose had recently been increased.  *   Recent CVA.  On Plavix.    *   IDDM  *   Hypothyroidism.  TSH normal.     PLAN:     *   EGD tomorrow.  ~ [id number] with Dr Loletha Carrow.    *   Is it okay to hold Plavix for now? Continue twice daily Protonix.  I would stop the metoclopramide because it is not helping  *   Ordered EKG in order to define patient's current rhythm  given arrhythmia on my physical exam.     Azucena Freed  12/02/2018, 1:37 PM Phone [phone]

## 2018-12-02 NOTE — H&P (Signed)
[facility] Admission History and Physical Service Pager: [phone]  Patient name: [name] Medical record number: [id number] Date of birth: 05-18-1951 Age: 67 y.o. Gender: female  Primary Care Provider: Aaryav Hopfensperger, [name], DO Consultants: GI Code Status: Full  Chief Complaint: nausea, weakness, dehydration  Assessment and Plan: [name] is a 67 y.o. female presenting with dehydration, weakness and nausea. 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  Abdominal Pain  Emesis  Diarrhea  Dehydration: Patient has been experiencing nausea and symptoms of GERD since stroke in October.  Now patient with 2 weeks of nausea with nonbloody nonbilious emesis and diarrhea that has been nonbloody.  Patient with multiple ED visits in order to have fluids, her symptoms will improve and then she will be sent home.  Patient has GI follow-up in January.  Has tried many medications in outpatient.  Will admit today for dehydration and to pursue further GI work-up of this uncontrolled nausea and vomiting.  Could be gastroparesis due to poorly controlled diabetes vs gastritis or GERD (patient reports avoiding other NSAIDs and no longer on aspirin 325) vs psychosomatic with recent anxiety regarding stroke with residual weakness.  Normal colonoscopy in 2015. Normal abdominal CT on 11/16/18.  Lipase normal at 17 today.  Patient denies any chest pain or shortness of breath so less concerning for cardiac etiology.  -Place in observation, attending Dr. Ardelia Mems -NS at 150 mL's per hour and encourage p.o. -Vitals per unit -Continuous cardiac monitoring -Consult GI, appreciate recommendations -Continue Reglan, Compazine q6 prn nausea (prolonged Qt on EKG)  H/o multiple CVA's Patient with recent CVA in 09/2018.  Does have residual left-sided weakness with strength 4/5 in LUE. patient  completed 3-week DAPT therapy and is continued on clopidogrel 75 mg daily with atorvastatin 80 mg.  Patient to follow-up with cardiology after a 30-day event monitor for concern for possible atrial fibrillation as follow-up on 12/13.  Patient not on a blood thinner at this time given that it is not known if she has atrial fibrillation. - monitor for any signs of new stroke - continue home clopidogrel and atorvastatin  Hypertension Takes losartan-HCTZ 100-12.5mg  and metoprolol 50 mg daily at home.  - monitor  Hyperlipidemia Takes atorvastatin 80mg  daily - continue home meds   T1DM Followed by Dr. Mel Almond with endocrinology, patient with frequent visits every 6 months.  Patient has insulin pump. CBG on admission to 37.  Poorly controlled with last hemoglobin A1c 9.8 on 10/14/2018.  - continue insulin pump  - monitor CBGs closely   Irregular Heart Beat  PAC's  Follow up after 30 day event monitor scheduled on 12/13. - continuous cardiac monitoring - can consider consult to cardiology if symptomatic   Depression and anxiety Patient recently started on fluoxetine 10 mg which improved anxiety after stroke. - patient may benefit from increasing dosage of her fluoxetine - could be contributing to her GI symptoms however after starting patient did see improvement.  GERD Home meds: zantac 150mg  bid, Protonix 40 mg twice daily with Gaviscon as needed and recently started on Reglan in ED - pepcid while inpatient as zantac not on formulary   Prolonged QT Patient with prolonged QT on admission.  Will avoid Zofran and Phenergan for nausea. -Compazine as needed -Continuous cardiac monitoring  Thyroid Cancer S/p thyroidectomy 2006. Currently on synthroid 175 mcg daily - TSH on 11/25/2018 WNL - continue home meds  Uterine Fibroids S/p hysterectomy  FEN/GI: carb modified, NS @ 149mL/hr Prophylaxis: Lovenox  Disposition: place in observation   History of Present Illness:  [name] is a 67 y.o. female presenting after multiple ED visits for dehydration with diarrhea and vomiting.  Patient with 4 ED visits in the past week all for similar things.  Patient reports that this morning she felt she was dehydrated and weak and again came to the emergency room.  Patient reports that she has had diarrhea for 2 weeks which is nonbloody not black.  Patient also has abdominal pain in the epigastric portion of her stomach.  Patient with 2 episodes of vomiting in the past 24 hours which were nonbloody and nonbilious.  Patient reports that the vomit appears to be more like phlegm.    Patient does live alone and since having a stroke in October has been more anxious.  Patient reports that she is very worried about everything this been happening and has previously had thoughts of hurting herself although her religion does not allow it.  She denies any suicidal or homicidal ideation at this time.   Patient reports that she has not been able to take any of her medications over the past couple of weeks given that she has felt nauseous every time she goes home.  Patient does report that she is been taking her thyroid medication however.  Review Of Systems: Per HPI with the following additions:   Review of Systems  Constitutional: Negative for chills and fever.  HENT: Positive for sore throat. Negative for congestion.   Eyes: Negative for blurred vision and double vision.  Respiratory: Negative for cough and shortness of breath.   Cardiovascular: Negative for chest pain and leg swelling.  Gastrointestinal: Positive for abdominal pain, diarrhea, heartburn, nausea and vomiting. Negative for blood in stool, constipation and melena.  Genitourinary: Negative for dysuria.  Musculoskeletal: Negative for myalgias.  Neurological: Positive for weakness. Negative for dizziness, focal weakness and headaches.  Psychiatric/Behavioral: The patient is nervous/anxious.     Patient Active  Problem List   Diagnosis Date Noted  . Dehydration 12/02/2018  . 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 Urinary frequency 10/29/2018  . Abnormal echocardiogram   . Stroke (cerebrum) ([place]) 10/13/2018  . Frequent PVCs   . Uncontrolled type 1 diabetes mellitus ([place])   . BPPV (benign paroxysmal positional vertigo), right 06/13/2017  . Premature atrial beats 06/13/2017  . Snoring 07/31/2016  . Pre-ulcerative corn or callous 12/15/2015  . History of CHF (congestive heart failure) 10/29/2015  . Essential hypertension 10/29/2015  . Cerebrovascular accident (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 ([place]) 10/29/2015  . Thyroid activity decreased   . Intracranial vascular stenosis   . HLD (hyperlipidemia)   . Cerebral infarction due to thrombosis of basilar artery ([place])   . CVA (cerebral vascular accident) ([place]) 08/29/2015  . Posterior tibial tendinitis of right leg 04/26/2014  . Atypical chest pain 10/06/2013  . Morbid obesity ([place]) 05/29/2013  . Back pain without radiation 04/28/2013  . Nail dystrophy 01/10/2013  . Diabetes mellitus ([place]) 01/25/2012  . Hyperlipidemia 01/25/2012  . Hypothyroidism 01/25/2012  . Hypertension 01/25/2012  . GERD (gastroesophageal reflux disease) 01/25/2012    Past Medical History: Past Medical History:  Diagnosis Date  . Anemia   . Arthritis   . CHF (congestive heart failure) ([place])   . Depression   . GERD (gastroesophageal reflux disease)   . Hemorrhoids   . Hyperlipidemia   .  Hypertension   . Hypothyroidism   . Seasonal allergies   . Stroke ([place])   . Thyroid cancer ([place])    per pt S/p Total Thyroidectomy with Radioactive Iodine Therapy  . Type 1 diabetes mellitus ([place])     Past Surgical History: Past Surgical History:  Procedure Laterality Date  . ABDOMINAL ANGIOGRAM N/A 05/14/2012   Procedure: ABDOMINAL ANGIOGRAM;  Surgeon: Laverda Page, MD;  Location: [facility] CATH LAB;  Service: Cardiovascular;  Laterality: N/A;  . ABDOMINAL  HYSTERECTOMY  1995   partial  . ANKLE FRACTURE SURGERY Right   . CARDIAC CATHETERIZATION     with coronary angiogram  . COLONOSCOPY N/A 09/09/2014   Procedure: COLONOSCOPY;  Surgeon: Gatha Mayer, MD;  Location: [facility];  Service: Endoscopy;  Laterality: N/A;  . LEFT AND RIGHT HEART CATHETERIZATION WITH CORONARY ANGIOGRAM N/A 05/14/2012   Procedure: LEFT AND RIGHT HEART CATHETERIZATION WITH CORONARY ANGIOGRAM;  Surgeon: Laverda Page, MD;  Location: [facility] CATH LAB;  Service: Cardiovascular;  Laterality: N/A;  . MINI METER   09/08/2014   ELECTRONIC INSULIN PUMP  . TEE WITHOUT CARDIOVERSION N/A 10/16/2018   Procedure: TRANSESOPHAGEAL ECHOCARDIOGRAM (TEE);  Surgeon: Buford Dresser, MD;  Location: [facility] ENDOSCOPY;  Service: Cardiovascular;  Laterality: N/A;  . THYROIDECTOMY  2006    Social History: Social History   Tobacco Use  . Smoking status: Never Smoker  . Smokeless tobacco: Never Used  Substance Use Topics  . Alcohol use: No  . Drug use: No   Additional social history: retired Pharmacist, hospital, no alcohol, illicit drug use or tobacco, lives alone  Please also refer to relevant sections of EMR.  Family History: Family History  Problem Relation Age of Onset  . Heart disease Mother        MI in 56s  . Hyperlipidemia Mother   . Hypertension Mother   . Renal Disease Mother        insufficiency  . Early death Father        Head Injury at Work  . Hyperlipidemia Father   . Hypertension Father   . Stroke Son   . Diabetes Maternal Aunt   . Prostate cancer Paternal Uncle   . Heart disease Maternal Aunt   . Heart failure Maternal Grandfather        Died of "heart attack" in his 23s  . Heart disease Maternal Grandfather   . Breast cancer Neg Hx     Allergies and Medications: Allergies  Allergen Reactions  . Latex Rash   No current facility-administered medications on file prior to encounter.    Current Outpatient Medications on File Prior to Encounter  Medication Sig  Dispense Refill  . acetaminophen (TYLENOL) 500 MG tablet Take 1,000 mg by mouth as needed for mild pain.    Marland Kitchen atorvastatin (LIPITOR) 80 MG tablet TAKE 1 TABLET BY MOUTH DAILY (Patient taking differently: Take 80 mg by mouth daily at 6 PM. ) 90 tablet 0  . clopidogrel (PLAVIX) 75 MG tablet TAKE 1 TABLET BY MOUTH EVERY DAY (Patient taking differently: Take 75 mg by mouth daily. ) 90 tablet 0  . CRANBERRY PO Take 1 tablet by mouth daily.    . diclofenac sodium (VOLTAREN) 1 % GEL Apply 4 g topically 4 (four) times daily. 100 g 2  . fexofenadine (ALLEGRA) 30 MG tablet Take 30 mg by mouth daily as needed (allergies).     Marland Kitchen FLUoxetine (PROZAC) 10 MG tablet Take 1 tablet (10 mg total) by mouth daily. [place]  tablet 3  . Insulin Human (INSULIN PUMP) SOLN Inject into the skin continuous. humalog insulin    . levothyroxine (SYNTHROID, LEVOTHROID) 175 MCG tablet Take 175 mcg by mouth daily.  12  . losartan-hydrochlorothiazide (HYZAAR) 100-12.5 MG tablet Take 1 tablet by mouth daily. 90 tablet 0  . metoCLOPramide (REGLAN) 10 MG tablet Take 1 tablet (10 mg total) by mouth every 6 (six) hours. 30 tablet 0  . metoprolol succinate (TOPROL-XL) 50 MG 24 hr tablet TAKE 1 TABLET BY MOUTH DAILY (Patient taking differently: Take 50 mg by mouth daily. ) 90 tablet 0  . pantoprazole (PROTONIX) 40 MG tablet TAKE 1 TABLET BY MOUTH TWICE DAILY, BEFORE MEALS (Patient taking differently: Take 40 mg by mouth 2 (two) times daily. ) 180 tablet 1  . ranitidine (ZANTAC) 150 MG tablet TAKE 1 TABLET BY MOUTH TWICE DAILY (Patient taking differently: Take 150 mg by mouth 2 (two) times daily. ) 180 tablet 3  . simethicone (GAS-X) 80 MG chewable tablet Chew 1 tablet (80 mg total) by mouth every 6 (six) hours as needed for flatulence. 30 tablet 0  . alum & mag hydroxide-simeth (MAALOX/MYLANTA) 200-200-20 MG/5ML suspension Take 15 mLs by mouth every 6 (six) hours as needed for indigestion or heartburn. (Patient not taking: Reported on 12/02/2018)  355 mL 0  . clopidogrel (PLAVIX) 75 MG tablet Take 1 tablet (75 mg total) by mouth daily. (Patient not taking: Reported on 11/30/2018) 30 tablet 0  . famotidine (PEPCID) 20 MG tablet Take 1 tablet (20 mg total) by mouth 2 (two) times daily. (Patient not taking: Reported on 12/02/2018) 30 tablet 0    Objective: BP (!) 144/47   Pulse (!) 56   Temp 98.4 F (36.9 C) (Oral)   Resp 11   SpO2 100%  Exam: General: NAD, pleasant Eyes: PERRL, EOMI, no conjunctival pallor or injection ENTM: Dry mucous membranes, no pharyngeal erythema or exudate Neck: Supple, no LAD Cardiovascular: Irregular rhythm with regular rate, no m/r/g, no LE edema Respiratory: normal work of breathing Gastrointestinal: soft, tender over epigastric area, nondistended MSK: moves 4 extremities equally Derm: no rashes appreciated Neuro: CN II-XII grossly intact, LUE 4/5 strength with RUE 5/5 strength Psych: AOx3, appropriate affect, no SI/HI  Labs and Imaging: CBC BMET  Recent Labs  Lab 12/02/18 1005  WBC 4.3  HGB 11.1*  HCT 36.6  PLT 293   Recent Labs  Lab 12/02/18 1005  NA 130*  K 3.7  CL 92*  CO2 23  BUN 6*  CREATININE 1.16*  GLUCOSE 236*  CALCIUM 9.0       Kelina Beauchamp, [name], DO 12/02/2018, 12:28 PM PGY-2, [place] Intern pager: [phone], text pages welcome

## 2018-12-02 NOTE — ED Triage Notes (Signed)
To ED via POV for continued weakness. Pt states she hasn't taken any medications but is on an insulin pump. Pt states she was on a liquid diet yesterday without feeling any better. Pt states she has had diarrhea over the past few weeks. Pt states she normally walks solo but today felt like she was going to fall so called her brother to bring her to the ED. Pt was 2 person assist getting from wheelchair to stretcher.

## 2018-12-02 NOTE — ED Provider Notes (Signed)
[facility] EMERGENCY DEPARTMENT Provider Note   CSN: [id number] Arrival date & time: 12/02/18  6301     History   Chief Complaint Chief Complaint  Patient presents with  . Weakness    HPI [name] [name] is a 67 y.o. female.  Pt presents to the ED today for continued n/v.  The pt has been here several times for n/v and has been diagnosed with diabetic gastroparesis. The pt lives by herself and feels like she can't take care of herself any more.  Her brother drove her here today.  Pt has had multiple scans and labs drawn.  The pt has been taking reglan without help of n/v.  She is diabetic and is on an insulin pump.     Past Medical History:  Diagnosis Date  . Anemia   . Arthritis   . CHF (congestive heart failure) ([place])   . Depression   . GERD (gastroesophageal reflux disease)   . Hemorrhoids   . Hyperlipidemia   . Hypertension   . Hypothyroidism   . Seasonal allergies   . Stroke ([place])   . Thyroid cancer ([place])    per pt S/p Total Thyroidectomy with Radioactive Iodine Therapy  . Type 1 diabetes mellitus [facility])     Patient Active Problem List   Diagnosis Date Noted  . Dehydration 12/02/2018  . 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 Urinary frequency 10/29/2018  . Abnormal echocardiogram   . Stroke (cerebrum) ([place]) 10/13/2018  . Frequent PVCs   . Uncontrolled type 1 diabetes mellitus ([place])   . BPPV (benign paroxysmal positional vertigo), right 06/13/2017  . Premature atrial beats 06/13/2017  . Snoring 07/31/2016  . Pre-ulcerative corn or callous 12/15/2015  . History of CHF (congestive heart failure) 10/29/2015  . Essential hypertension 10/29/2015  . 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 Thyroid activity decreased   . Intracranial vascular stenosis   . HLD (hyperlipidemia)   . 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 CVA (cerebral vascular accident)  ([place]) 08/29/2015  . Posterior tibial tendinitis of right leg 04/26/2014  . Atypical chest pain 10/06/2013  . Morbid obesity ([place]) 05/29/2013  . Back pain without radiation 04/28/2013  . Nail dystrophy 01/10/2013  . Diabetes mellitus ([place]) 01/25/2012  . Hyperlipidemia 01/25/2012  . Hypothyroidism 01/25/2012  . Hypertension 01/25/2012  . GERD (gastroesophageal reflux disease) 01/25/2012    Past Surgical History:  Procedure Laterality Date  . ABDOMINAL ANGIOGRAM N/A 05/14/2012   Procedure: ABDOMINAL ANGIOGRAM;  Surgeon: Laverda Page, MD;  Location: [facility] CATH LAB;  Service: Cardiovascular;  Laterality: N/A;  . ABDOMINAL HYSTERECTOMY  1995   partial  . ANKLE FRACTURE SURGERY Right   . CARDIAC CATHETERIZATION     with coronary angiogram  . COLONOSCOPY N/A 09/09/2014   Procedure: COLONOSCOPY;  Surgeon: Gatha Mayer, MD;  Location: [facility];  Service: Endoscopy;  Laterality: N/A;  . LEFT AND RIGHT HEART CATHETERIZATION WITH CORONARY ANGIOGRAM N/A 05/14/2012   Procedure: LEFT AND RIGHT HEART CATHETERIZATION WITH CORONARY ANGIOGRAM;  Surgeon: Laverda Page, MD;  Location: [facility] CATH LAB;  Service: Cardiovascular;  Laterality: N/A;  . MINI METER   09/08/2014   ELECTRONIC INSULIN PUMP  . TEE WITHOUT CARDIOVERSION N/A 10/16/2018   Procedure: TRANSESOPHAGEAL ECHOCARDIOGRAM (TEE);  Surgeon: Buford Dresser, MD;  Location: [facility] ENDOSCOPY;  Service: Cardiovascular;  Laterality: N/A;  . THYROIDECTOMY  2006  OB History   None      Home Medications    Prior to Admission medications   Medication Sig Start Date End Date Taking? Authorizing Provider  acetaminophen (TYLENOL) 500 MG tablet Take 1,000 mg by mouth as needed for mild pain.   Yes [provider]  atorvastatin (LIPITOR) 80 MG tablet TAKE 1 TABLET BY MOUTH DAILY Patient taking differently: Take 80 mg by mouth daily at 6 PM.  10/17/18  Yes Enid Derry, [name], DO  clopidogrel (PLAVIX) 75 MG tablet TAKE 1 TABLET BY  MOUTH EVERY DAY Patient taking differently: Take 75 mg by mouth daily.  07/15/18  Yes Enid Derry, [name], DO  CRANBERRY PO Take 1 tablet by mouth daily.   Yes [provider]  diclofenac sodium (VOLTAREN) 1 % GEL Apply 4 g topically 4 (four) times daily. 11/11/18  Yes Enid Derry, [name], DO  fexofenadine (ALLEGRA) 30 MG tablet Take 30 mg by mouth daily as needed (allergies).    Yes [provider]  FLUoxetine (PROZAC) 10 MG tablet Take 1 tablet (10 mg total) by mouth daily. 10/29/18  Yes Enid Derry, [name], DO  Insulin Human (INSULIN PUMP) SOLN Inject into the skin continuous. humalog insulin   Yes [provider]  levothyroxine (SYNTHROID, LEVOTHROID) 175 MCG tablet Take 175 mcg by mouth daily. 06/06/17  Yes [provider]  losartan-hydrochlorothiazide (HYZAAR) 100-12.5 MG tablet Take 1 tablet by mouth daily. 01/30/18  Yes Guadalupe Dawn, MD  metoCLOPramide (REGLAN) 10 MG tablet Take 1 tablet (10 mg total) by mouth every 6 (six) hours. 11/30/18  Yes Noemi Chapel, MD  metoprolol succinate (TOPROL-XL) 50 MG 24 hr tablet TAKE 1 TABLET BY MOUTH DAILY Patient taking differently: Take 50 mg by mouth daily.  10/17/18  Yes Enid Derry, [name], DO  pantoprazole (PROTONIX) 40 MG tablet TAKE 1 TABLET BY MOUTH TWICE DAILY, BEFORE MEALS Patient taking differently: Take 40 mg by mouth 2 (two) times daily.  11/11/18  Yes Enid Derry, [name], DO  ranitidine (ZANTAC) 150 MG tablet TAKE 1 TABLET BY MOUTH TWICE DAILY Patient taking differently: Take 150 mg by mouth 2 (two) times daily.  09/23/18  Yes Enid Derry, [name], DO  simethicone (GAS-X) 80 MG chewable tablet Chew 1 tablet (80 mg total) by mouth every 6 (six) hours as needed for flatulence. 11/16/18  Yes Montine Circle, PA-C  alum & mag hydroxide-simeth (MAALOX/MYLANTA) 200-200-20 MG/5ML suspension Take 15 mLs by mouth every 6 (six) hours as needed for indigestion or heartburn. Patient not taking: Reported on 12/02/2018 11/25/18   Larene Pickett,  PA-C  clopidogrel (PLAVIX) 75 MG tablet Take 1 tablet (75 mg total) by mouth daily. Patient not taking: Reported on 11/30/2018 10/17/18   Guadalupe Dawn, MD  famotidine (PEPCID) 20 MG tablet Take 1 tablet (20 mg total) by mouth 2 (two) times daily. Patient not taking: Reported on 12/02/2018 11/25/18   Larene Pickett, PA-C    Family History Family History  Problem Relation Age of Onset  . Heart disease Mother        MI in 25s  . Hyperlipidemia Mother   . Hypertension Mother   . Renal Disease Mother        insufficiency  . Early death Father        Head Injury at Work  . Hyperlipidemia Father   . Hypertension Father   . Stroke Son   . Diabetes Maternal Aunt   . Prostate cancer Paternal Uncle   . Heart disease Maternal Aunt   . Heart failure  Maternal Grandfather        Died of "heart attack" in his 8s  . Heart disease Maternal Grandfather   . Breast cancer Neg Hx     Social History Social History   Tobacco Use  . Smoking status: Never Smoker  . Smokeless tobacco: Never Used  Substance Use Topics  . Alcohol use: No  . Drug use: No     Allergies   Latex   Review of Systems Review of Systems  Gastrointestinal: Positive for abdominal pain, diarrhea, nausea and vomiting.  All other systems reviewed and are negative.    Physical Exam Updated Vital Signs BP (!) 144/47   Pulse (!) 56   Temp 98.4 F (36.9 C) (Oral)   Resp 11   SpO2 100%   Physical Exam  Constitutional: She is oriented to person, place, and time. She appears well-developed and well-nourished.  HENT:  Head: Normocephalic and atraumatic.  Right Ear: External ear normal.  Left Ear: External ear normal.  Nose: Nose normal.  Mouth/Throat: Mucous membranes are dry.  Eyes: Pupils are equal, round, and reactive to light. Conjunctivae and EOM are normal.  Neck: Normal range of motion. Neck supple.  Cardiovascular: Normal rate, regular rhythm, normal heart sounds and intact distal pulses.    Pulmonary/Chest: Effort normal and breath sounds normal.  Abdominal: Soft. Bowel sounds are normal. There is generalized tenderness.  Musculoskeletal: Normal range of motion.  Neurological: She is alert and oriented to person, place, and time.  Skin: Skin is warm. Capillary refill takes less than 2 seconds.  Psychiatric: She has a normal mood and affect. Her behavior is normal. Judgment and thought content normal.  Nursing note and vitals reviewed.    ED Treatments / Results  Labs (all labs ordered are listed, but only abnormal results are displayed) Labs Reviewed  CBC WITH DIFFERENTIAL/PLATELET - Abnormal; Notable for the following components:      Result Value   Hemoglobin 11.1 (*)    All other components within normal limits  COMPREHENSIVE METABOLIC PANEL - Abnormal; Notable for the following components:   Sodium 130 (*)    Chloride 92 (*)    Glucose, Bld 236 (*)    BUN 6 (*)    Creatinine, Ser 1.16 (*)    Albumin 3.4 (*)    GFR calc non Af Amer 49 (*)    GFR calc Af Amer 56 (*)    All other components within normal limits  URINALYSIS, ROUTINE W REFLEX MICROSCOPIC - Abnormal; Notable for the following components:   Color, Urine STRAW (*)    Glucose, UA 150 (*)    Leukocytes, UA TRACE (*)    Bacteria, UA RARE (*)    All other components within normal limits  CBG MONITORING, ED - Abnormal; Notable for the following components:   Glucose-Capillary 237 (*)    All other components within normal limits  LIPASE, BLOOD  MAGNESIUM    EKG EKG Interpretation  Date/Time:  Monday December 02 2018 08:39:14 EST Ventricular Rate:  62 PR Interval:    QRS Duration: 99 QT Interval:  389 QTC Calculation: 395 R Axis:   16 Text Interpretation:  Sinus rhythm Multiple premature complexes, vent & supraven Nonspecific T abnormalities, diffuse leads PVCs are new Confirmed by Isla Pence [phone]) on 12/02/2018 8:45:28 AM   Radiology Dg Abd Acute W/chest  Result Date:  11/30/2018 CLINICAL DATA:  Shortness of breath and vomiting. EXAM: DG ABDOMEN ACUTE W/ 1V CHEST COMPARISON:  Chest x-ray 11/25/2018  FINDINGS: The upright chest x-ray demonstrates mild stable cardiac enlargement and thoracic aortic calcifications but no acute pulmonary findings. No pleural effusion. Two views of the abdomen demonstrate scattered air in the colon but no findings for small bowel obstruction or free air. No worrisome calcifications. IMPRESSION: No acute cardiopulmonary findings.  Stable cardiac enlargement. No plain film findings for an acute abdominal process. No changes to suggest small bowel obstruction or free air. Electronically Signed   By: Marijo Sanes M.D.   On: 11/30/2018 19:15    Procedures Procedures (including critical care time)  Medications Ordered in ED Medications  0.9 %  sodium chloride infusion (has no administration in time range)  ondansetron (ZOFRAN) injection 4 mg (4 mg Intravenous Given 12/02/18 1001)  sodium chloride 0.9 % bolus 1,000 mL (0 mLs Intravenous Stopped 12/02/18 1108)     Initial Impression / Assessment and Plan / ED Course  I have reviewed the triage vital signs and the nursing notes.  Pertinent labs & imaging results that were available during my care of the patient were reviewed by me and considered in my medical decision making (see chart for details).    Pt has been here multiple times for the same.  Work up so far has been unremarkable.  Pt has been in contact with her pcp frequently.  I spoke to her pcp (Dr. Enid Derry, [place] resident) who will admit and have GI see her in the hospital.  Final Clinical Impressions(s) / ED Diagnoses   Final diagnoses:  Dehydration  Non-intractable vomiting with nausea, unspecified vomiting type  Diabetic gastroparesis [facility])  Hyperglycemia    ED Discharge Orders    None       Isla Pence, MD 12/02/18 1230

## 2018-12-02 NOTE — ED Notes (Signed)
Pt refusing to have BP checked also refusing to stay in bed.  Pt sitting on the side presently.  Family now at the bedside.

## 2018-12-02 NOTE — Progress Notes (Signed)
Assumed care on pt. , pt. Resting with no distress, respirations unlabored , IV site intact , denies pain  , plan of care explained to pt.

## 2018-12-03 ENCOUNTER — Encounter (HOSPITAL_COMMUNITY)
Admission: EM | Disposition: A | Discharge status: Home / Self Care | Payer: Self-pay | Source: Home / Self Care | Attending: Emergency Medicine

## 2018-12-03 ENCOUNTER — Observation Stay (HOSPITAL_COMMUNITY): Payer: Medicare Other | Admitting: Anesthesiology

## 2018-12-03 ENCOUNTER — Ambulatory Visit: Payer: Medicare Other | Admitting: Nurse Practitioner

## 2018-12-03 ENCOUNTER — Encounter (HOSPITAL_COMMUNITY): Payer: Self-pay | Admitting: *Deleted

## 2018-12-03 ENCOUNTER — Other Ambulatory Visit: Payer: Self-pay | Admitting: Family Medicine

## 2018-12-03 DIAGNOSIS — G8929 Other chronic pain: Secondary | ICD-10-CM | POA: Diagnosis not present

## 2018-12-03 DIAGNOSIS — R112 Nausea with vomiting, unspecified: Secondary | ICD-10-CM | POA: Diagnosis not present

## 2018-12-03 DIAGNOSIS — B3781 Candidal esophagitis: Secondary | ICD-10-CM | POA: Diagnosis not present

## 2018-12-03 DIAGNOSIS — K219 Gastro-esophageal reflux disease without esophagitis: Secondary | ICD-10-CM | POA: Diagnosis not present

## 2018-12-03 DIAGNOSIS — K229 Disease of esophagus, unspecified: Secondary | ICD-10-CM | POA: Diagnosis not present

## 2018-12-03 DIAGNOSIS — R6881 Early satiety: Secondary | ICD-10-CM | POA: Diagnosis not present

## 2018-12-03 DIAGNOSIS — R1013 Epigastric pain: Secondary | ICD-10-CM | POA: Diagnosis not present

## 2018-12-03 HISTORY — PX: ESOPHAGOGASTRODUODENOSCOPY (EGD) WITH PROPOFOL: SHX5813

## 2018-12-03 LAB — CBC
HEMATOCRIT: 32.9 % — AB (ref 36.0–46.0)
HEMOGLOBIN: 10.4 g/dL — AB (ref 12.0–15.0)
MCH: 27.8 pg (ref 26.0–34.0)
MCHC: 31.6 g/dL (ref 30.0–36.0)
MCV: 88 fL (ref 80.0–100.0)
Platelets: 266 10*3/uL (ref 150–400)
RBC: 3.74 MIL/uL — ABNORMAL LOW (ref 3.87–5.11)
RDW: 12.2 % (ref 11.5–15.5)
WBC: 4.6 10*3/uL (ref 4.0–10.5)
nRBC: 0 % (ref 0.0–0.2)

## 2018-12-03 LAB — GLUCOSE, CAPILLARY
Glucose-Capillary: 142 mg/dL — ABNORMAL HIGH (ref 70–99)
Glucose-Capillary: 140 mg/dL — ABNORMAL HIGH (ref 70–99)
Glucose-Capillary: 140 mg/dL — ABNORMAL HIGH (ref 70–99)
Glucose-Capillary: 99 mg/dL (ref 70–99)

## 2018-12-03 LAB — COMPREHENSIVE METABOLIC PANEL
ALT: 18 U/L (ref 0–44)
AST: 18 U/L (ref 15–41)
Albumin: 2.9 g/dL — ABNORMAL LOW (ref 3.5–5.0)
Alkaline Phosphatase: 68 U/L (ref 38–126)
Anion gap: 9 (ref 5–15)
BUN: 5 mg/dL — ABNORMAL LOW (ref 8–23)
CO2: 25 mmol/L (ref 22–32)
Calcium: 8.6 mg/dL — ABNORMAL LOW (ref 8.9–10.3)
Chloride: 104 mmol/L (ref 98–111)
Creatinine, Ser: 0.98 mg/dL (ref 0.44–1.00)
GFR calc Af Amer: >60 mL/min (ref >60)
GFR calc non Af Amer: 60 mL/min — ABNORMAL LOW (ref >60)
Glucose, Bld: 165 mg/dL — ABNORMAL HIGH (ref 70–99)
POTASSIUM: 3.9 mmol/L (ref 3.5–5.1)
Sodium: 138 mmol/L (ref 135–145)
Total Bilirubin: 0.7 mg/dL (ref 0.3–1.2)
Total Protein: 5.9 g/dL — ABNORMAL LOW (ref 6.5–8.1)

## 2018-12-03 LAB — HEMOGLOBIN A1C
Hgb A1c MFr Bld: 9.2 % — ABNORMAL HIGH (ref 4.8–5.6)
MEAN PLASMA GLUCOSE: 217.34 mg/dL

## 2018-12-03 SURGERY — ESOPHAGOGASTRODUODENOSCOPY (EGD) WITH PROPOFOL
Anesthesia: Monitor Anesthesia Care

## 2018-12-03 MED ORDER — BUTAMBEN-TETRACAINE-BENZOCAINE 2-2-14 % EX AERO
INHALATION_SPRAY | CUTANEOUS | Status: DC | PRN
  Administered 2018-12-03: 2 via TOPICAL

## 2018-12-03 MED ORDER — INSULIN PUMP
Freq: Three times a day (TID) | SUBCUTANEOUS | Status: DC
  Administered 2018-12-03: 14:00:00 via SUBCUTANEOUS
  Filled 2018-12-03: qty 1

## 2018-12-03 MED ORDER — PROPOFOL 500 MG/50ML IV EMUL
INTRAVENOUS | Status: DC | PRN
  Administered 2018-12-03: 100 ug/kg/min via INTRAVENOUS

## 2018-12-03 MED ORDER — LACTATED RINGERS IV SOLN
INTRAVENOUS | Status: DC | PRN
  Administered 2018-12-03: 09:00:00 via INTRAVENOUS

## 2018-12-03 MED ORDER — PROCHLORPERAZINE MALEATE 5 MG PO TABS: 5.0000 mg | ORAL_TABLET | Freq: Four times a day (QID) | ORAL | 0 refills | Status: DC | PRN

## 2018-12-03 MED ORDER — PROPOFOL 10 MG/ML IV BOLUS
INTRAVENOUS | Status: DC | PRN
  Administered 2018-12-03: 50 mg via INTRAVENOUS

## 2018-12-03 MED ORDER — LOSARTAN POTASSIUM 100 MG PO TABS: 100.0000 mg | ORAL_TABLET | Freq: Every day | ORAL | 0 refills | Status: DC

## 2018-12-03 SURGICAL SUPPLY — 15 items

## 2018-12-03 NOTE — Anesthesia Preprocedure Evaluation (Signed)
Anesthesia Evaluation  Patient identified by MRN, date of birth, ID band Patient awake    Reviewed: Allergy & Precautions, H&P , NPO status , Patient's Chart, lab work & pertinent test results  Airway Mallampati: II   Neck ROM: full    Dental   Pulmonary neg pulmonary ROS,    breath sounds clear to auscultation       Cardiovascular hypertension, + Peripheral Vascular Disease and +CHF   Rhythm:regular Rate:Normal     Neuro/Psych PSYCHIATRIC DISORDERS Depression CVA    GI/Hepatic GERD  ,  Endo/Other  diabetes, Type 1, Insulin DependentHypothyroidism Morbid obesity  Renal/GU      Musculoskeletal  (+) Arthritis ,   Abdominal   Peds  Hematology  (+) Blood dyscrasia, anemia ,   Anesthesia Other Findings   Reproductive/Obstetrics                             Anesthesia Physical Anesthesia Plan  ASA: III  Anesthesia Plan: MAC   Post-op Pain Management:    Induction: Intravenous  PONV Risk Score and Plan: 2 and Ondansetron, Propofol infusion and Treatment may vary due to age or medical condition  Airway Management Planned: Nasal Cannula  Additional Equipment:   Intra-op Plan:   Post-operative Plan:   Informed Consent: I have reviewed the patients History and Physical, chart, labs and discussed the procedure including the risks, benefits and alternatives for the proposed anesthesia with the patient or authorized representative who has indicated his/her understanding and acceptance.     Plan Discussed with: CRNA, Anesthesiologist and Surgeon  Anesthesia Plan Comments:         Anesthesia Quick Evaluation

## 2018-12-03 NOTE — Care Management Important Message (Signed)
Important Message  Patient Details  Name: Damisha Wolff MRN: [id number] Date of Birth: 1951-05-16   Medicare Important Message Given:  Yes    Midge Minium RN, BSN, NCM-BC, ACM-RN [phone] 12/03/2018, 1:06 PM

## 2018-12-03 NOTE — Discharge Instructions (Signed)
You were seen and evaluated for nausea and vomiting. All your lab work and your Esophogastroduodenoscopy (EDG) showed some lesions in your esophagus that were suspicious for a Candidiasis infection. The gastroenterology specialist will follow up with you on the final results. They did mention that they do not believe these are causing your symptoms. It is still unclear at this time what the cause of your nausea and vomiting are but it could be related to your anxiety.

## 2018-12-03 NOTE — Op Note (Signed)
[facility] Patient Name: [name] Procedure Date : 12/03/2018 MRN: [id number] Attending MD: Estill Cotta. Loletha Carrow , MD Date of Birth: 1951-01-17 CSN: [id number] Age: 67 Admit Type: Inpatient Procedure:                Upper GI endoscopy Indications:              Epigastric abdominal pain, Abdominal bloating,                            Early satiety (no cause on CT and labs; no                            improvement on acid suppression or metoclopramide.                            Symptom onset soon after CVA) Providers:                Mallie Mussel L. Loletha Carrow, MD, Carlyn Reichert, RN, Cletis Athens,                            Technician, Lance Coon, CRNA Referring MD:             [facility] Medicines:                Monitored Anesthesia Care Complications:            No immediate complications. Estimated Blood Loss:     Estimated blood loss: none. Procedure:                Pre-Anesthesia Assessment:                           - Prior to the procedure, a History and Physical                            was performed, and patient medications and                            allergies were reviewed. The patient's tolerance of                            previous anesthesia was also reviewed. The risks                            and benefits of the procedure and the sedation                            options and risks were discussed with the patient.                            All questions were answered, and informed consent                            was obtained. Prior Anticoagulants: The patient has  taken Plavix (clopidogrel), last dose was 2 days                            prior to procedure. ASA Grade Assessment: III - A                            patient with severe systemic disease. After                            reviewing the risks and benefits, the patient was                            deemed in satisfactory condition to undergo the                             procedure.                           After obtaining informed consent, the endoscope was                            passed under direct vision. Throughout the                            procedure, the patient's blood pressure, pulse, and                            oxygen saturations were monitored continuously. The                            [id number]-H190 ([id number]) Olympus Adult EGD was introduced                            through the mouth, and advanced to the second part                            of duodenum. The upper GI endoscopy was                            accomplished without difficulty. The patient                            tolerated the procedure well. Scope In: Scope Out: Findings:      Localized, white plaques were found in the upper third of the esophagus.       Brushings for cytology were obtained in the upper third of the       esophagus. The patient had been given cetacaine spray, which may also       account for this appearance (if no yeast found on brushings)      The exam of the esophagus was otherwise normal.      The stomach was normal.      The cardia and gastric fundus were normal on retroflexion.      The examined duodenum was normal. Impression:               -  Esophageal plaques were found, suspicious for                            candidiasis. Brushings performed. Not causing                            reported symptoms.                           - Normal stomach.                           - Normal examined duodenum.                           - It seems increasingly likely that there is an                            element of anxiety contributing to symptoms,                            especially considering patient's reported stressors                            after CVA. Recommendation:           - Return patient to hospital ward for possible                            discharge same day.                           - Diabetic (ADA)  diet.                           - Discontinue metoclopramide                           Primary care follow up. Procedure Code(s):        --- Professional ---                           [phone], Esophagogastroduodenoscopy, flexible,                            transoral; diagnostic, including collection of                            specimen(s) by brushing or washing, when performed                            (separate procedure) Diagnosis Code(s):        --- Professional ---                           K22.9, Disease of esophagus, unspecified                           R10.13,  Epigastric pain                           R14.0, Abdominal distension (gaseous)                           R68.81, Early satiety CPT copyright 2018 American Medical Association. All rights reserved. The codes documented in this report are preliminary and upon coder review may  be revised to meet current compliance requirements. Henry L. Loletha Carrow, MD 12/03/2018 9:23:25 AM This report has been signed electronically. Number of Addenda: 0

## 2018-12-03 NOTE — Progress Notes (Addendum)
Family Medicine Teaching Service Daily Progress Note Intern Pager: [phone]  Patient name: [name] Medical record number: [id number] Date of birth: June 19, 1951 Age: 67 y.o. Gender: female  Primary Care Provider: Shirley, [name], DO Consultants: GI Code Status: FULL  Pt Overview and Major Events to Date:  12/9 - admit FPTS 12/10 - EGD  Assessment and Plan: [name] is a 67 y.o. female presenting with dehydration, weakness and nausea. 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  Abdominal Pain  Emesis  Diarrhea  Dehydration: Ongoing symptoms for 2 months, worsening now with 2 weeks of NBNB emesis nad diarrhea. Admitted for GI workup and IVF rehydration. Considering gastroparesis vs gastritis vs GERD though no improvement with PPI trial. Anxiety likely contributes. -NS at 150 mL's per hour and encourage PO. Consider Decreasing fluids to encourage better PO. -Continuous cardiac monitoring -Consult GI, appreciate recommendations - EGD today 12/10 -Continue Reglan, Compazine q6 prn nausea (prolonged Qt on EKG)  H/o multiple CVA's Patient with recent CVA in 09/2018.  Does have residual left-sided weakness with strength 4/5 in LUE. patient completed 3-week DAPT therapy and is continued on clopidogrel 75 mg daily with atorvastatin 80 mg.  Patient to follow-up with cardiology after a 30-day event monitor for concern for possible atrial fibrillation as follow-up on 12/13.  Patient not on a blood thinner at this time given that it is not known if she has atrial fibrillation. - monitor for any signs of new stroke - continue home clopidogrel and atorvastatin  Depression and anxiety Patient recently started on fluoxetine 10 mg which improved anxiety after stroke. - patient may benefit from increasing dosage of her fluoxetine - could be contributing to her GI symptoms however after  starting patient did see improvement.  Hypertension Takes losartan-HCTZ 100-12.5mg  and metoprolol 50 mg dailyat home.  - continue losartan - hold HCTZ while giving fluids  Hyperlipidemia -  Takes atorvastatin 80mg  daily - continue home meds  T1DM Followed by Dr. Mel Almond with endocrinology, patient with frequent visits every 6 months.  Patient has insulin pump. CBG on admission to 37.  Poorly controlled with last hemoglobin A1c 9.8 on 10/14/2018.  - continue insulin pump  - monitor CBGs closely  Irregular Heart Beat  PAC's  Follow up after 30 day event monitor scheduled on 12/13. - continuous cardiac monitoring - can consider consult to cardiology if symptomatic  GERD Home meds: zantac 150mg  bid, Protonix 40 mg twice daily with Gaviscon as needed and recently started on Reglan in ED - pepcid while inpatient as zantac not on formulary  Prolonged QT Patient with prolonged QT on admission.  Will avoid Zofran and Phenergan for nausea. -Compazine as needed -Continuous cardiac monitoring  Thyroid Cancer S/p thyroidectomy 2006. Currently on synthroid 175 mcg daily - TSH on 11/25/2018 WNL - continue home meds  Uterine Fibroids S/p hysterectomy  FEN/GI: carb modified, NS @ 176mL/hr Prophylaxis: Lovenox  Disposition: potential DC today  Subjective:  Patient seen and examined after endoscopy. Eating comfortably, tolerating diet. Amenable to going home today.  Objective: Temp:  [97.3 F (36.3 C)-98.3 F (36.8 C)] 98.1 F (36.7 C) (12/10 0922) Pulse Rate:  [62-91] 70 (12/10 0922) Resp:  [18-20] 19 (12/10 0922) BP: (116-175)/(48-82) 145/62 (12/10 0922) SpO2:  [97 %-100 %] 98 % (12/10 0922) Weight:  [113.4 kg-121.4 kg] 121.4 kg (12/10 0457) Physical Exam: GEN: comfortable, sitting in chair eating lunch, pleasant and appropriate  HEENT: NCAT RESPIRATORY: clear to auscultation bilaterally with no wheezes, rhonchi or rales, good effort  CV: RRR, no m/r/g, no  peripheral edema GI: Soft, non-tender, non-distended, normoactive bowel sounds, no hepatosplenomegaly SKIN: warm and dry, no rashes or lesions NEURO: II-XII grossly intact PSYCH: AAOx3, appropriate affect    Laboratory: Recent Labs  Lab 11/30/18 1754 12/02/18 1005 12/03/18 0503  WBC 5.0 4.3 4.6  HGB 11.6* 11.1* 10.4*  HCT 35.8* 36.6 32.9*  PLT 262 293 266   Recent Labs  Lab 11/30/18 1754 12/02/18 1005 12/03/18 0503  NA 131* 130* 138  K 3.9 3.7 3.9  CL 95* 92* 104  CO2 23 23 25   BUN 5* 6* 5*  CREATININE 1.09* 1.16* 0.98  CALCIUM 9.1 9.0 8.6*  PROT 6.6 6.9 5.9*  BILITOT 0.7 0.7 0.7  ALKPHOS 82 83 68  ALT 22 20 18   AST 25 23 18   GLUCOSE 246* 236* 165*      Imaging/Diagnostic Tests: No results found.  [name] Coombe, MD 12/03/2018, 9:39 AM PGY-3, [facility] Intern pager: [phone], text pages welcome

## 2018-12-03 NOTE — Discharge Summary (Signed)
[facility] Discharge Summary  Patient name: [name] Medical record number: [id number] Date of birth: 1951/11/03 Age: 67 y.o. Gender: female Date of Admission: 12/02/2018  Date of Discharge: 12/10 Admitting Physician: Leeanne Rio, MD  Primary Care Provider: Shirley, [name], DO Consultants: GI  Indication for Hospitalization: Nausea/Vomiting  Discharge Diagnoses/Problem List:  Patient Active Problem List   Diagnosis Date Noted  . Dehydration 12/02/2018  . 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 Urinary frequency 10/29/2018  . Abnormal echocardiogram   . Stroke (cerebrum) ([place]) 10/13/2018  . Frequent PVCs   . Uncontrolled type 1 diabetes mellitus ([place])   . BPPV (benign paroxysmal positional vertigo), right 06/13/2017  . Premature atrial beats 06/13/2017  . Snoring 07/31/2016  . Pre-ulcerative corn or callous 12/15/2015  . History of CHF (congestive heart failure) 10/29/2015  . Essential hypertension 10/29/2015  . Cerebrovascular accident (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 ([place]) 10/29/2015  . Thyroid activity decreased   . Intracranial vascular stenosis   . HLD (hyperlipidemia)   . Cerebral infarction due to thrombosis of basilar artery ([place])   . CVA (cerebral vascular accident) ([place]) 08/29/2015  . Posterior tibial tendinitis of right leg 04/26/2014  . Atypical chest pain 10/06/2013  . Morbid obesity ([place]) 05/29/2013  . Back pain without radiation 04/28/2013  . Nail dystrophy 01/10/2013  . Diabetes mellitus ([place]) 01/25/2012  . Hyperlipidemia 01/25/2012  . Hypothyroidism 01/25/2012  . Hypertension 01/25/2012  . GERD (gastroesophageal reflux disease) 01/25/2012     Disposition: Home  Discharge Condition: Stable/Improved  Discharge Exam: copied from progress note on the day of discharge Temp:  [97.3 F (36.3 C)-98.3 F (36.8 C)] 98.1 F (36.7 C) (12/10 0922) Pulse Rate:  [62-91] 70 (12/10 0922) Resp:   [18-20] 19 (12/10 0922) BP: (116-175)/(48-82) 145/62 (12/10 0922) SpO2:  [97 %-100 %] 98 % (12/10 0922) Weight:  [113.4 kg-121.4 kg] 121.4 kg (12/10 0457) Physical Exam: GEN: comfortable, sitting in chair eating lunch, pleasant and appropriate HEENT: NCAT RESPIRATORY: clear to auscultation bilaterally with no wheezes, rhonchi or rales, good effort  CV: RRR, no m/r/g, no peripheral edema GI: Soft, non-tender, non-distended, normoactive bowel sounds, no hepatosplenomegaly SKIN: warm and dry, no rashes or lesions NEURO: II-XII grossly intact PSYCH: AAOx3, appropriate affect   Brief Hospital Course:  67 year old female with history of recent stroke presented to the hospital with 2 months of nausea, vomiting and diarrhea with multiple recent ED visits. She was initially planned for outpatient GI follow up, however due to her persistent ED visits for fluid rehydration, she was hospitalized for GI evaluation.  Nausea was controlled with medications and IV fluids were given. She underwent EGD which demonstrated normal findings. White plaques were suggestive of candidiasis and brushings were performed. Patient was considered stable for hospital discharge with PCP follow up. Patient was reassured by normal results.  Issues for Follow Up:  1. Follow up EGD brushings, treat candidiasis if necessary. 2. Continue to manage patient's anxiety which contributes to her frequent ED visits.  Significant Procedures: EGD  Significant Labs and Imaging:  Recent Labs  Lab 11/30/18 1754 12/02/18 1005 12/03/18 0503  WBC 5.0 4.3 4.6  HGB 11.6* 11.1* 10.4*  HCT 35.8* 36.6 32.9*  PLT 262 293 266   Recent Labs  Lab 11/27/18 0031 11/30/18 1754 12/02/18 1005 12/03/18 0503  NA 131* 131* 130* 138  K 3.4* 3.9 3.7 3.9  CL 94* 95* 92* 104  CO2 25 23  23 25  GLUCOSE 114* 246* 236* 165*  BUN <5* 5* 6* 5*  CREATININE 0.99 1.09* 1.16* 0.98  CALCIUM 9.3 9.1 9.0 8.6*  MG  --   --  1.9  --   ALKPHOS 83 82 83  68  AST 26 25 23 18   ALT 19 22 20 18   ALBUMIN 3.4* 3.4* 3.4* 2.9*      Results/Tests Pending at Time of Discharge: EGD brushings for candidiasis  Discharge Medications:  Allergies as of 12/03/2018      Reactions   Latex Rash      Medication List    STOP taking these medications   alum & mag hydroxide-simeth 200-200-20 MG/5ML suspension Commonly known as:  MAALOX/MYLANTA   CRANBERRY PO   losartan-hydrochlorothiazide 100-12.5 MG tablet Commonly known as:  HYZAAR   metoCLOPramide 10 MG tablet Commonly known as:  REGLAN   ranitidine 150 MG tablet Commonly known as:  ZANTAC   simethicone 80 MG chewable tablet Commonly known as:  MYLICON     TAKE these medications   acetaminophen 500 MG tablet Commonly known as:  TYLENOL Take 1,000 mg by mouth as needed for mild pain.   atorvastatin 80 MG tablet Commonly known as:  LIPITOR TAKE 1 TABLET BY MOUTH DAILY What changed:  when to take this   clopidogrel 75 MG tablet Commonly known as:  PLAVIX TAKE 1 TABLET BY MOUTH EVERY DAY What changed:  Another medication with the same name was removed. Continue taking this medication, and follow the directions you see here.   diclofenac sodium 1 % Gel Commonly known as:  VOLTAREN Apply 4 g topically 4 (four) times daily.   famotidine 20 MG tablet Commonly known as:  PEPCID Take 1 tablet (20 mg total) by mouth 2 (two) times daily.   fexofenadine 30 MG tablet Commonly known as:  ALLEGRA Take 30 mg by mouth daily as needed (allergies).   FLUoxetine 10 MG tablet Commonly known as:  PROZAC Take 1 tablet (10 mg total) by mouth daily.   insulin pump Soln Inject into the skin continuous. humalog insulin   levothyroxine 175 MCG tablet Commonly known as:  SYNTHROID, LEVOTHROID Take 175 mcg by mouth daily.   losartan 100 MG tablet Commonly known as:  COZAAR TAKE 1 TABLET BY MOUTH DAILY   metoprolol succinate 50 MG 24 hr tablet Commonly known as:  TOPROL-XL TAKE 1 TABLET  BY MOUTH DAILY   pantoprazole 40 MG tablet Commonly known as:  PROTONIX TAKE 1 TABLET BY MOUTH TWICE DAILY, BEFORE MEALS What changed:  when to take this   prochlorperazine 5 MG tablet Commonly known as:  COMPAZINE Take 1 tablet (5 mg total) by mouth every 6 (six) hours as needed for nausea or vomiting.       Discharge Instructions: Please refer to Patient Instructions section of EMR for full details.  Patient was counseled important signs and symptoms that should prompt return to medical care, changes in medications, dietary instructions, activity restrictions, and follow up appointments.   Follow-Up Appointments: Follow-up Information    Shirley, [name], DO. Go on 12/09/2018.   Specialty:  Family Medicine Why:  Please arrive at 1:15pm on 12/16 at the [facility] to ensure you are seen on time for your 1:30pm appointment with Dr. Enid Derry. Contact information: [address] [address] [phone]        Jerline Pain, MD .   Specialty:  Cardiology Contact information: [phone] [facility]  [address] [place] [address] [phone]           Everrett Coombe, MD 12/03/2018, 2:38 PM PGY-3, [place]

## 2018-12-03 NOTE — Progress Notes (Signed)
Assumed care from Herbie [place], [place]. Patient stable, A/OX4 and educated not to eat or drink at this point for her scheduled procedure later in the day.

## 2018-12-03 NOTE — Interval H&P Note (Signed)
History and Physical Interval Note:  12/03/2018 8:00 AM  [name]  has presented today for surgery, with the diagnosis of nausea, vomiting, upper abdominal pain  The various methods of treatment have been discussed with the patient and family. After consideration of risks, benefits and other options for treatment, the patient has consented to  Procedure(s): ESOPHAGOGASTRODUODENOSCOPY (EGD) WITH PROPOFOL (N/A) as a surgical intervention .  The patient's history has been reviewed, patient examined, no change in status, stable for surgery.  I have reviewed the patient's chart and labs.  Questions were answered to the patient's satisfaction.     [name]

## 2018-12-03 NOTE — Transfer of Care (Signed)
Immediate Anesthesia Transfer of Care Note  Patient: [name]  Procedure(s) Performed: ESOPHAGOGASTRODUODENOSCOPY (EGD) WITH PROPOFOL (N/A ) ESOPHAGEAL BRUSHING  Patient Location: Endoscopy Unit  Anesthesia Type:MAC  Level of Consciousness: awake and patient cooperative  Airway & Oxygen Therapy: Patient Spontanous Breathing  Post-op Assessment: Report given to RN and Post -op Vital signs reviewed and stable  Post vital signs: Reviewed and stable  Last Vitals:  Vitals Value Taken Time  BP 145/62 12/03/2018  9:22 AM  Temp 36.7 C 12/03/2018  9:22 AM  Pulse 77 12/03/2018  9:24 AM  Resp 19 12/03/2018  9:24 AM  SpO2 98 % 12/03/2018  9:24 AM  Vitals shown include unvalidated device data.  Last Pain:  Vitals:   12/03/18 0922  TempSrc: Oral  PainSc: 0-No pain         Complications: No apparent anesthesia complications

## 2018-12-03 NOTE — Progress Notes (Signed)
Inpatient Diabetes Program Recommendations  AACE/ADA: New Consensus Statement on Inpatient Glycemic Control (2015)  Target Ranges:  Prepandial:   less than 140 mg/dL      Peak postprandial:   less than 180 mg/dL (1-2 hours)      Critically ill patients:  140 - 180 mg/dL   Lab Results  Component Value Date   GLUCAP 140 (H) 12/03/2018   HGBA1C 9.2 (H) 12/03/2018    Review of Glycemic Control  Inpatient Diabetes Program Recommendations:   Spoke with RN Nira Conn regarding patient's insulin pump. RN states patient has insulin pump on and currently in endoscopy. Reviewed with RN patient needs insulin pump order, print off contract and have patient sign then place in shadow chart. Also print flowsheet for patient to document insulin administration @ bedside. Patient needs to continue using CBGs done by staff with CBG machine.  12:00 Spoke with RN Nira Conn and patient @ bedside. Heather RN received verbal orders for insulin pump to continue. Patient states her insertion site is 51 days old and called family member to bring new set to change site. Site is warm and dry. Current pump settings (patient sees [name] for endocrinology) -4 am .55 units/hr 9 am    1.3 units/hr 3 pm     1.15 units/hr 6 pm     1.0 unit/hr 7:30 pm 1.1 unit/hr 12 am   .75 unit/hr Patient signed contract for insulin pump and has flowsheet @ bedside to write amounts of insulin.  Thank you, Nani Gasser. Corlene Sabia, RN, MSN, CDE  Diabetes Coordinator Inpatient Glycemic Control Team Team Pager [phone] (8am-5pm) 12/03/2018 10:07 AM

## 2018-12-04 NOTE — Anesthesia Postprocedure Evaluation (Signed)
Anesthesia Post Note  Patient: [name]  Procedure(s) Performed: ESOPHAGOGASTRODUODENOSCOPY (EGD) WITH PROPOFOL (N/A ) ESOPHAGEAL BRUSHING     Patient location during evaluation: Endoscopy Anesthesia Type: MAC Level of consciousness: awake and alert Pain management: pain level controlled Vital Signs Assessment: post-procedure vital signs reviewed and stable Respiratory status: spontaneous breathing, nonlabored ventilation, respiratory function stable and patient connected to nasal cannula oxygen Cardiovascular status: stable and blood pressure returned to baseline Postop Assessment: no apparent nausea or vomiting Anesthetic complications: no    Last Vitals:  Vitals:   12/03/18 1215 12/03/18 1600  BP: (!) 154/74 (!) 156/60  Pulse: 75 (!) 55  Resp: 16 18  Temp: 36.6 C 36.8 C  SpO2: 95% 98%    Last Pain:  Vitals:   12/03/18 1600  TempSrc: Oral  PainSc:                  [place]

## 2018-12-05 ENCOUNTER — Other Ambulatory Visit: Payer: Self-pay

## 2018-12-05 MED ORDER — FLUCONAZOLE 100 MG PO TABS: 100.0000 mg | ORAL_TABLET | Freq: Every day | ORAL | 0 refills | Status: DC

## 2018-12-06 ENCOUNTER — Ambulatory Visit: Payer: Medicare Other | Admitting: Cardiology

## 2018-12-06 ENCOUNTER — Encounter: Payer: Self-pay | Admitting: Cardiology

## 2018-12-06 VITALS — BP 162/80 | HR 79 | Ht 67.0 in | Wt 267.4 lb

## 2018-12-06 DIAGNOSIS — I639 Cerebral infarction, unspecified: Secondary | ICD-10-CM

## 2018-12-06 NOTE — Patient Instructions (Signed)
Medication Instructions:  Your physician recommends that you continue on your current medications as directed. Please refer to the Current Medication list given to you today.  * If you need a refill on your cardiac medications before your next appointment, please call your pharmacy.   Labwork: None ordered   Testing/Procedures: Your physician has recommended that you have a loop recorder implanted. - Please report to the [facility] on 12/23/2018 at 6:30 a.m. - You may have a light breakfast the morning of the procedure - You may take all of your medications the morning of the procedure - Wash chest & neck area with the antibacterial soap/surgical scrub the night before and the morning of the procedure   Follow-Up: Your physician recommends that you schedule a follow-up appointment in: 7-10 days, after your procedure on 12/23/2018, with device clinic for a wound check.  No follow up is needed at this time with Dr. Curt Bears.  He will see you on an as needed basis.  Thank you for choosing [facility]!!   Trinidad Curet, RN [phone]  Any Other Special Instructions Will Be Listed Below (If Applicable).   Implantable Loop Recorder Placement An implantable loop recorder is a small electronic device that is placed under the skin of your chest. It is about the size of an AA ("double A") battery. The device records the electrical activity of your heart over a long period of time. Your health care provider can download these recordings to monitor your heart. You may need an implantable loop recorder if you have periods of abnormal heart activity (arrhythmias) or unexplained fainting (syncope) caused by a heart problem. Tell a health care provider about:  Any allergies you have.  All medicines you are taking, including vitamins, herbs, eye drops, creams, and over-the-counter medicines.  Any problems you or family members have had with anesthetic  medicines.  Any blood disorders you have.  Any surgeries you have had.  Any medical conditions you have.  Whether you are pregnant or may be pregnant. What are the risks? Generally, this is a safe procedure. However, as with any procedure, problems may occur, including:  Infection.  Bleeding.  Allergic reactions to anesthetic medicines.  Damage to nerves or blood vessels.  Failure of the device to work. This could require another surgery to replace it.  What happens before the procedure?   You may have a physical exam, blood tests, and imaging tests of your heart, such as a chest X-ray.  Follow instructions from your health care provider about eating or drinking restrictions.  Ask your health care provider about: ? Changing or stopping your regular medicines. This is especially important if you are taking diabetes medicines or blood thinners. ? Taking medicines such as aspirin and ibuprofen. These medicines can thin your blood. Do not take these medicines before your procedure if your surgeon instructs you not to.  Ask your health care provider how your surgical site will be marked or identified.  You may be given antibiotic medicine to help prevent infection.  Plan to have someone take you home after the procedure.  If you will be going home right after the procedure, plan to have someone with you for 24 hours.  Do not use any tobacco products, such as cigarettes, chewing tobacco, and e-cigarettes as told by your surgeon. If you need help quitting, ask your health care provider. What happens during the procedure?  To reduce your risk of infection: ?  Your health care team will wash or sanitize their hands. ? Your skin will be washed with soap.  An IV tube will be inserted into one of your veins.  You may be given an antibiotic medicine through the IV tube.  You may be given one or more of the following: ? A medicine to help you relax (sedative). ? A medicine to  numb the area (local anesthetic).  A small cut (incision) will be made on the left side of your upper chest.  A pocket will be created under your skin.  The device will be placed in the pocket.  The incision will be closed with stitches (sutures) or adhesive strips.  A bandage (dressing) will be placed over the incision. The procedure may vary among health care providers and hospitals. What happens after the procedure?  Your blood pressure, heart rate, breathing rate, and blood oxygen level will be monitored often until the medicines you were given have worn off.  You may be able to go home on the day of your surgery. Before going home: ? Your health care provider will program your recorder. ? You will learn how to trigger your device with a handheld activator. ? You will learn how to send recordings to your health care provider. ? You will get an ID card for your device, and you will be told when to use it.  Do not drive for 24 hours if you received a sedative. This information is not intended to replace advice given to you by your health care provider. Make sure you discuss any questions you have with your health care provider. Document Released: 11/22/2015 Document Revised: 05/18/2016 Document Reviewed: 09/15/2015 Elsevier Interactive Patient Education  [employer].

## 2018-12-06 NOTE — Progress Notes (Signed)
Electrophysiology Office Note   Date:  12/06/2018   ID:  [name], DOB [birth year], MRN [id number]  PCP:  Shirley, [name], DO  Cardiologist:  Marlou Porch Primary Electrophysiologist:  Dr Curt Bears    CC: Follow up for cryptogenic stroke   History of Present Illness: [name] is a 67 y.o. female who is being seen today for the evaluation of PACs at the request of Shirley, [name], DO. Presenting today for electrophysiology evaluation.  She has a history of hypertension, type 1 diabetes, hyperlipidemia, thyroid cancer status post thyroidectomy, prior CVA in 2016, and GERD.  She was admitted to the hospital with left-sided weakness and diagnosed with an acute stroke on 10/13/18.  On telemetry, she was found to have frequent atrial ectopy.  This was not thought to be due to atrial fibrillation.  Today, she denies symptoms of chest pain, shortness of breath, orthopnea, PND, lower extremity edema, claudication, dizziness, presyncope, syncope, bleeding. She does have some residual numbness/tingling on her left side. The patient is tolerating medications without difficulties. She recently wore an event monitor which showed frequent ectopy but no atrial fibrillation. She has never had any palpitations. Her main complaint today is the adverse skin reaction she had to the adhesive of the event monitor.   Past Medical History:  Diagnosis Date  . Anemia   . Arthritis   . CHF (congestive heart failure) ([place])   . Depression   . GERD (gastroesophageal reflux disease)   . Hemorrhoids   . Hyperlipidemia   . Hypertension   . Hypothyroidism   . Seasonal allergies   . Stroke ([place])   . Thyroid cancer ([place])    per pt S/p Total Thyroidectomy with Radioactive Iodine Therapy  . Type 1 diabetes mellitus ([place])    Past Surgical History:  Procedure Laterality Date  . ABDOMINAL ANGIOGRAM N/A 05/14/2012   Procedure: ABDOMINAL ANGIOGRAM;  Surgeon: Laverda Page, MD;  Location:  [facility] CATH LAB;  Service: Cardiovascular;  Laterality: N/A;  . ABDOMINAL HYSTERECTOMY  1995   partial  . ANKLE FRACTURE SURGERY Right   . CARDIAC CATHETERIZATION     with coronary angiogram  . COLONOSCOPY N/A 09/09/2014   Procedure: COLONOSCOPY;  Surgeon: Gatha Mayer, MD;  Location: [facility];  Service: Endoscopy;  Laterality: N/A;  . ESOPHAGOGASTRODUODENOSCOPY (EGD) WITH PROPOFOL N/A 12/03/2018   Procedure: ESOPHAGOGASTRODUODENOSCOPY (EGD) WITH PROPOFOL;  Surgeon: Doran Stabler, MD;  Location: [facility];  Service: Gastroenterology;  Laterality: N/A;  . LEFT AND RIGHT HEART CATHETERIZATION WITH CORONARY ANGIOGRAM N/A 05/14/2012   Procedure: LEFT AND RIGHT HEART CATHETERIZATION WITH CORONARY ANGIOGRAM;  Surgeon: Laverda Page, MD;  Location: [facility] CATH LAB;  Service: Cardiovascular;  Laterality: N/A;  . MINI METER   09/08/2014   ELECTRONIC INSULIN PUMP  . TEE WITHOUT CARDIOVERSION N/A 10/16/2018   Procedure: TRANSESOPHAGEAL ECHOCARDIOGRAM (TEE);  Surgeon: Buford Dresser, MD;  Location: [facility] ENDOSCOPY;  Service: Cardiovascular;  Laterality: N/A;  . THYROIDECTOMY  2006     Current Outpatient Medications  Medication Sig Dispense Refill  . acetaminophen (TYLENOL) 500 MG tablet Take 1,000 mg by mouth as needed for mild pain.    Marland Kitchen atorvastatin (LIPITOR) 80 MG tablet TAKE 1 TABLET BY MOUTH DAILY (Patient taking differently: Take 80 mg by mouth daily at 6 PM. ) 90 tablet 0  . clopidogrel (PLAVIX) 75 MG tablet TAKE 1 TABLET BY MOUTH EVERY DAY (Patient taking differently: Take 75 mg by mouth daily. ) 90 tablet 0  .  diclofenac sodium (VOLTAREN) 1 % GEL Apply 4 g topically 4 (four) times daily. 100 g 2  . famotidine (PEPCID) 20 MG tablet Take 1 tablet (20 mg total) by mouth 2 (two) times daily. 30 tablet 0  . fexofenadine (ALLEGRA) 30 MG tablet Take 30 mg by mouth daily as needed (allergies).     . fluconazole (DIFLUCAN) 100 MG tablet Take 1 tablet (100 mg total) by mouth daily. 14  tablet 0  . FLUoxetine (PROZAC) 10 MG tablet Take 1 tablet (10 mg total) by mouth daily. 30 tablet 3  . Insulin Human (INSULIN PUMP) SOLN Inject into the skin continuous. humalog insulin    . levothyroxine (SYNTHROID, LEVOTHROID) 175 MCG tablet Take 175 mcg by mouth daily.  12  . losartan (COZAAR) 100 MG tablet TAKE 1 TABLET BY MOUTH DAILY 90 tablet 0  . metoprolol succinate (TOPROL-XL) 50 MG 24 hr tablet TAKE 1 TABLET BY MOUTH DAILY (Patient taking differently: Take 50 mg by mouth daily. ) 90 tablet 0  . pantoprazole (PROTONIX) 40 MG tablet TAKE 1 TABLET BY MOUTH TWICE DAILY, BEFORE MEALS (Patient taking differently: Take 40 mg by mouth 2 (two) times daily. ) 180 tablet 1  . prochlorperazine (COMPAZINE) 5 MG tablet Take 1 tablet (5 mg total) by mouth every 6 (six) hours as needed for nausea or vomiting. 30 tablet 0   No current facility-administered medications for this visit.     Allergies:   Latex   Social History:  The patient  reports that she has never smoked. She has never used smokeless tobacco. She reports that she does not drink alcohol or use drugs.   Family History:  The patient's family history includes Diabetes in her maternal aunt; Early death in her father; Heart attack in her maternal grandfather and mother; Heart disease in her maternal aunt, maternal grandfather, and mother; Heart failure in her maternal grandfather; Hyperlipidemia in her father and mother; Hypertension in her father and mother; Prostate cancer in her paternal uncle; Renal Disease in her mother; Stroke in her son.    ROS:  Please see the history of present illness.  All other systems are reviewed and negative.    PHYSICAL EXAM: VS:  BP (!) 162/80   Pulse 79   Ht 5\' 7"  (1.702 m)   Wt 267 lb 6.4 oz (121.3 kg)   SpO2 98%   BMI 41.88 kg/m  , BMI Body mass index is 41.88 kg/m. GEN: Well nourished, well developed, in no acute distress  HEENT: normal  Neck: no JVD, carotid bruits, or masses Cardiac: RRR;  occasional ectopic beat heard no murmurs, rubs, or gallops,no edema  Respiratory:  clear to auscultation bilaterally, normal work of breathing MS: no deformity or atrophy  Skin: warm and dry Neuro:  Strength and sensation are grossly intact Psych: euthymic mood, full affect  EKG:  EKG is not ordered today.   Recent Labs: 10/29/2018: BNP 30.7 11/25/2018: TSH 2.479 12/02/2018: Magnesium 1.9 12/03/2018: ALT 18; BUN 5; Creatinine, Ser 0.98; Hemoglobin 10.4; Platelets 266; Potassium 3.9; Sodium 138    Lipid Panel     Component Value Date/Time   CHOL 107 10/14/2018 0439   TRIG 53 10/14/2018 0439   HDL 48 10/14/2018 0439   CHOLHDL 2.2 10/14/2018 0439   VLDL 11 10/14/2018 0439   LDLCALC 48 10/14/2018 0439   LDLDIRECT 75 01/25/2012 1508     Wt Readings from Last 3 Encounters:  12/06/18 267 lb 6.4 oz (121.3 kg)  12/03/18 267  lb 10.2 oz (121.4 kg)  11/27/18 268 lb (121.6 kg)      Other studies Reviewed: Additional studies/ records that were reviewed today include: TTE 10/14/18  Review of the above records today demonstrates:  - Left ventricle: The cavity size was normal. There was moderate   concentric hypertrophy. Systolic function was normal. The   estimated ejection fraction was in the range of 60% to 65%. Wall   motion was normal; there were no regional wall motion   abnormalities. The study is not technically sufficient to allow   evaluation of LV diastolic function. - Left atrium: The atrium was normal in size. - Right atrium: The atrium was normal in size.  Cardiac monitor 10/28/18 - personally reviewed PAC's, 1 episodes NSVT 5 beats, no atrial fibrillation  ASSESSMENT AND PLAN:  1.  Cardiogenic stroke: Patient has had two strokes in 2016 and 09/2018. Thus far, no atrial fibrillation has been seen on telemetry or heart monitoring. However, she does have frequent atrial ectopy. We had a long discussion about atrial fibrillation and her stroke risk. We discussed the risks  and benefits of implanting a loop recorder. Patient is agreeable to proceed with the implant. Her CHADS2VASC score is at least 5. No anticoagulation indicated at this time as she has had no clear diagnosis of afib. Continue statin and plavix.  2.  Hypertension: Her BP is elevated today. She admits to being very nervous about today's visit  3.  Hyperlipidemia: continue statin therapy    Current medicines are reviewed at length with the patient today.   The patient does not have concerns regarding her medicines.  The following changes were made today:  none  Labs/ tests ordered today include:  No orders of the defined types were placed in this encounter.   Disposition:  Zorion Nims schedule ILR implant. Wound check after.  Signed, Melizza Kanode Meredith Leeds, MD  12/06/2018 10:40 AM     [facility] [address] [place] [address] [phone] (office) [phone] (fax)  I have seen and examined this patient with Adline Peals.  Agree with above, note added to reflect my findings.  On exam, RRR, no murmurs, lungs clear.  Cardiac monitor that showed episodes of PACs, but no atrial fibrillation.  Due to her cryptogenic stroke, we Enio Hornback plan to implant Linq monitor.  Risks and benefits were discussed and include bleeding and infection.  She understands the risks and is agreed to the procedure.  Lanijah Warzecha M. Davius Goudeau MD 12/06/2018 10:42 AM

## 2018-12-09 ENCOUNTER — Ambulatory Visit: Payer: Medicare Other | Admitting: Family Medicine

## 2018-12-09 ENCOUNTER — Encounter: Payer: Self-pay | Admitting: Family Medicine

## 2018-12-09 ENCOUNTER — Other Ambulatory Visit: Payer: Self-pay

## 2018-12-09 VITALS — BP 160/68 | HR 51 | Temp 98.1°F | Ht 67.0 in | Wt 264.4 lb

## 2018-12-09 DIAGNOSIS — F0631 Mood disorder due to known physiological condition with depressive features: Secondary | ICD-10-CM

## 2018-12-09 DIAGNOSIS — L84 Corns and callosities: Secondary | ICD-10-CM

## 2018-12-09 DIAGNOSIS — R232 Flushing: Secondary | ICD-10-CM

## 2018-12-09 DIAGNOSIS — I10 Essential (primary) hypertension: Secondary | ICD-10-CM

## 2018-12-09 DIAGNOSIS — I69398 Other sequelae of cerebral infarction: Secondary | ICD-10-CM

## 2018-12-09 DIAGNOSIS — Z23 Encounter for immunization: Secondary | ICD-10-CM

## 2018-12-09 DIAGNOSIS — D649 Anemia, unspecified: Secondary | ICD-10-CM | POA: Diagnosis not present

## 2018-12-09 MED ORDER — FLUOXETINE HCL 20 MG PO TABS: 20.0000 mg | ORAL_TABLET | Freq: Every day | ORAL | 0 refills | Status: DC

## 2018-12-09 MED ORDER — PANTOPRAZOLE SODIUM 40 MG PO TBEC: 40.0000 mg | DELAYED_RELEASE_TABLET | Freq: Every day | ORAL | 0 refills | Status: DC

## 2018-12-09 NOTE — Progress Notes (Signed)
Subjective:    Patient ID: [name], female    DOB: Feb 14, 1951, 67 y.o.   MRN: [id number]   CC: follow up  HPI: [name] is a 67 yo female presenting with PMH significant for h/o multiple CVA's (last 10/19 with residual L sided weakness), T1DM, HLD, Hypothyroidism, HTN, GERD here for hospital f/u.  Esophageal Candidiasis: Patient sent treatment for candidiasis and has stopped her statin while on diflucan for 2 weeks per GI Reports her nausea has improved and she has been eating a little more  Depression/ Anxiety after Stroke: Patient reports feeling much better on the prozac No SI/HI, she is however still anxious regarding driving and living alone but her family is helping her out a lot, although she reports that she does not like relying on her family so much   Hot Flashes: Patient reports having hot flashes daily since her stroke. She has not mentioned this previously bc other things were bothering her more but now that she is feeling better, she is concerned.  Reports that she has a hot flash at least every hour while awake, everyone else int he room will be feeling comfortable but she breaks in a sweat and has to turn a fan on her or remove clothing.  S/p hysterectomy at 67 yo and is post-menopausal for many years Reports this is not happening much at night, but mainly while she is awake. Does not recall being anxious when this happens.   Smoking status reviewed  ROS: 10 point ROS is otherwise negative, except as mentioned in HPI  Patient Active Problem List   Diagnosis Date Noted  . Hot flashes 12/11/2018  . 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 Abnormal echocardiogram   . Stroke (cerebrum) ([place]) 10/13/2018  . Frequent PVCs   . Uncontrolled type 1 diabetes mellitus ([place])   . BPPV (benign paroxysmal positional vertigo), right 06/13/2017  . Premature atrial beats 06/13/2017  . Snoring 07/31/2016  . Pre-ulcerative corn or callous  12/15/2015  . History of CHF (congestive heart failure) 10/29/2015  . Essential hypertension 10/29/2015  . Cerebrovascular accident (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 ([place]) 10/29/2015  . Thyroid activity decreased   . Intracranial vascular stenosis   . HLD (hyperlipidemia)   . Cerebral infarction due to thrombosis of basilar artery ([place])   . CVA (cerebral vascular accident) ([place]) 08/29/2015  . Posterior tibial tendinitis of right leg 04/26/2014  . Atypical chest pain 10/06/2013  . Morbid obesity ([place]) 05/29/2013  . Back pain without radiation 04/28/2013  . Nail dystrophy 01/10/2013  . Diabetes mellitus ([place]) 01/25/2012  . Hyperlipidemia 01/25/2012  . Hypothyroidism 01/25/2012  . Hypertension 01/25/2012  . GERD (gastroesophageal reflux disease) 01/25/2012     Objective:  BP (!) 160/68   Pulse (!) 51   Temp 98.1 F (36.7 C) (Oral)   Ht 5\' 7"  (1.702 m)   Wt 264 lb 6.4 oz (119.9 kg)   SpO2 97%   BMI 41.41 kg/m  Vitals and nursing note reviewed  General: NAD, pleasant Cardiac: RRR, normal heart sounds, no murmurs Respiratory: CTAB, normal effort Abdomen: soft, nontender, nondistended Extremities: no edema or cyanosis. WWP. Skin: warm and dry, no rashes noted Neuro: alert and oriented, no focal deficits Psych: normal affect  Diabetic Foot Exam - Simple   Simple Foot Form Diabetic Foot exam was performed with the following findings:  Yes 12/10/2018  2:34 PM  Visual Inspection No deformities, no ulcerations, no other skin  breakdown bilaterally:  Yes See comments:  Yes Sensation Testing Intact to touch and monofilament testing bilaterally:  Yes Pulse Check Posterior Tibialis and Dorsalis pulse intact bilaterally:  Yes Comments Callus noted on bilateral great toes with no signs of ulceration or infection and patient reports these are stable for many years.  Patient follows up with podiatry for her foot care.    Assessment & Plan:    Pre-ulcerative corn or  callous Callus noted on bilateral great toes with no signs of ulceration or infection.  Patient reports that they are stable and have looked like this for many years. -Recommended warm soaks, follow-up with podiatry as patient sees them regularly -Stressed importance of supportive and well fitting footwear -Patient to follow-up with endocrinologist for her diabetes in February  Hot flashes Unclear etiology of hot flashes as patient has been postmenopausal for many years.  Could be related to anxiety regarding stroke as patient developed the symptoms after recent stroke.  Could be related to recent stroke as this may have altered patient's thermoregulation, however unsure if location of stroke would be the cause of this, and patient may follow-up with neurology regarding this.  Could be due to hormonal imbalance however patient is not a candidate for estrogen therapy. -Will increase patient's Prozac from 10 mg to 20 mg today as patient reports that this has helped with her depression and anxiety. -Counseled patient on wearing proper amount of clothing while in the home as it also seems like it may be related to behavioral reasons given that she may be wearing too many close and keeping her house too hot  Essential hypertension Patient's BP 160/68 during visit today.  Patient to have follow-up in 2 weeks and will monitor for better control at next visit.  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  Will increase Prozac from 10 mg to 20 mg given continued hot flashes and patient reporting anxiety when she gets in the car and when she is around family.  Continue to monitor closely. We will increase patient's visits with me to every 2 weeks until patient feels more comfortable after her stroke.  Patient reports she feels much better after being hospitalized.   [name] Sudie Bandel, DO Family Medicine Resident PGY-2

## 2018-12-09 NOTE — Patient Instructions (Addendum)
Thank you for coming to see me today. It was a pleasure! Today we talked about:   We have increased your prozac to 20 mg daily. This may help with your hot flashes. We are unclear of the cause, but do not believe it is related to your medications. Please continue all of your current medications.   I will fax the form to help you get the SCAT bus.   Please try to eat small meals throughout the day and be sure you are checking your blood sugar regularly to ensure that you are not going below 60 and try to keep your blood sugar below 120 is possible. I have attached a diet plan to help. Switching from white rice to brown is a start.   Please follow-up with me in 4 weeks or sooner as needed.  If you have any questions or concerns, please do not hesitate to call the office at [phone].  Take Care,   [name] Corin Tilly, DO  Diet Recommendations for Diabetes  Carbohydrate includes starch, sugar, and fiber.  Of these, only sugar and starch raise blood glucose.  (Fiber is found in fruits, vegetables [especially skin, seeds, and stalks] and whole grains.)   Starchy (carb) foods: Bread, rice, pasta, potatoes, corn, cereal, grits, crackers, bagels, muffins, all baked goods.  (Fruit, milk, and yogurt also have carbohydrate, but most of these foods will not spike your blood sugar as most starchy foods will.)  A few fruits do cause high blood sugars; use small portions of bananas (limit to 1/2 at a time), grapes, watermelon, oranges, and most tropical fruits.   Protein foods: Meat, fish, poultry, eggs, dairy foods, and beans such as pinto and kidney beans (beans also provide carbohydrate).   1. Eat at least REAL 3 meals and 1-2 snacks per day. Never go more than 4-5 hours while awake without eating. Eat breakfast within the first hour of getting up.   2. Limit starchy foods to TWO per meal and ONE per snack. ONE portion of a starchy  food is equal to the following:   - ONE slice of bread (or its  equivalent, such as half of a hamburger bun).   - 1/2 cup of a "scoopable" starchy food such as potatoes or rice.   - 15 grams of Total Carbohydrate as shown on food label.  3. Include at every meal: a protein food, a carb food, and vegetables and/or fruit.   - Obtain twice the volume of veg's as protein or carbohydrate foods for both lunch and dinner.   - Fresh or frozen veg's are best.   - Keep frozen veg's on hand for a quick vegetable serving.

## 2018-12-10 LAB — CBC
Hemoglobin: 10.4 g/dL — ABNORMAL LOW (ref 11.1–15.9)
MCH: 28.2 pg (ref 26.6–33.0)
MCHC: 31.8 g/dL (ref 31.5–35.7)
MCV: 89 fL (ref 79–97)
Platelets: 290 10*3/uL (ref 150–450)
RBC: 3.69 x10E6/uL — ABNORMAL LOW (ref 3.77–5.28)
RDW: 12.8 % (ref 12.3–15.4)
WBC: 5.3 10*3/uL (ref 3.4–10.8)

## 2018-12-10 LAB — ANEMIA PANEL
FOLATE, RBC: 1142 ng/mL (ref >498)
Ferritin: 229 ng/mL — ABNORMAL HIGH (ref 15–150)
Folate, Hemolysate: 373.4 ng/mL
Hematocrit: 32.7 % — ABNORMAL LOW (ref 34.0–46.6)
Iron Saturation: 17 % (ref 15–55)
Iron: 38 ug/dL (ref 27–139)
Retic Ct Pct: 1.9 % (ref 0.6–2.6)
Total Iron Binding Capacity: 219 ug/dL — ABNORMAL LOW (ref 250–450)
UIBC: 181 ug/dL (ref 118–369)
Vitamin B-12: 894 pg/mL (ref 232–1245)

## 2018-12-11 DIAGNOSIS — R232 Flushing: Secondary | ICD-10-CM | POA: Insufficient documentation

## 2018-12-11 NOTE — Assessment & Plan Note (Signed)
Patient gad 7 score is 0 today.  Will increase Prozac from 10 mg to 20 mg given continued hot flashes and patient reporting anxiety when she gets in the car and when she is around family.  Continue to monitor closely. We will increase patient's visits with me to every 2 weeks until patient feels more comfortable after her stroke.  Patient reports she feels much better after being hospitalized.

## 2018-12-11 NOTE — Assessment & Plan Note (Signed)
Callus noted on bilateral great toes with no signs of ulceration or infection.  Patient reports that they are stable and have looked like this for many years. -Recommended warm soaks, follow-up with podiatry as patient sees them regularly -Stressed importance of supportive and well fitting footwear -Patient to follow-up with endocrinologist for her diabetes in February

## 2018-12-11 NOTE — Assessment & Plan Note (Signed)
Patient's BP 160/68 during visit today.  Patient to have follow-up in 2 weeks and will monitor for better control at next visit.

## 2018-12-11 NOTE — Assessment & Plan Note (Signed)
Unclear etiology of hot flashes as patient has been postmenopausal for many years.  Could be related to anxiety regarding stroke as patient developed the symptoms after recent stroke.  Could be related to recent stroke as this may have altered patient's thermoregulation, however unsure if location of stroke would be the cause of this, and patient may follow-up with neurology regarding this.  Could be due to hormonal imbalance however patient is not a candidate for estrogen therapy. -Will increase patient's Prozac from 10 mg to 20 mg today as patient reports that this has helped with her depression and anxiety. -Counseled patient on wearing proper amount of clothing while in the home as it also seems like it may be related to behavioral reasons given that she may be wearing too many close and keeping her house too hot

## 2018-12-13 ENCOUNTER — Encounter (HOSPITAL_COMMUNITY): Payer: Self-pay | Admitting: Emergency Medicine

## 2018-12-13 ENCOUNTER — Emergency Department (HOSPITAL_COMMUNITY)
Admission: EM | Admit: 2018-12-13 | Discharge: 2018-12-14 | Disposition: A | Discharge status: Home / Self Care | Payer: Medicare Other | Attending: Emergency Medicine | Admitting: Emergency Medicine

## 2018-12-13 ENCOUNTER — Other Ambulatory Visit: Payer: Self-pay

## 2018-12-13 ENCOUNTER — Emergency Department (HOSPITAL_COMMUNITY): Payer: Medicare Other

## 2018-12-13 DIAGNOSIS — Z79899 Other long term (current) drug therapy: Secondary | ICD-10-CM | POA: Diagnosis not present

## 2018-12-13 DIAGNOSIS — E039 Hypothyroidism, unspecified: Secondary | ICD-10-CM | POA: Diagnosis not present

## 2018-12-13 DIAGNOSIS — M25561 Pain in right knee: Secondary | ICD-10-CM | POA: Diagnosis present

## 2018-12-13 DIAGNOSIS — I110 Hypertensive heart disease with heart failure: Secondary | ICD-10-CM | POA: Diagnosis not present

## 2018-12-13 DIAGNOSIS — I509 Heart failure, unspecified: Secondary | ICD-10-CM | POA: Insufficient documentation

## 2018-12-13 DIAGNOSIS — E109 Type 1 diabetes mellitus without complications: Secondary | ICD-10-CM | POA: Insufficient documentation

## 2018-12-13 LAB — CBG MONITORING, ED
GLUCOSE-CAPILLARY: 235 mg/dL — AB (ref 70–99)
GLUCOSE-CAPILLARY: 188 mg/dL — AB (ref 70–99)
Glucose-Capillary: 228 mg/dL — ABNORMAL HIGH (ref 70–99)
Glucose-Capillary: 143 mg/dL — ABNORMAL HIGH (ref 70–99)

## 2018-12-13 MED ORDER — ACETAMINOPHEN 500 MG PO TABS
1000.0000 mg | ORAL_TABLET | ORAL | Status: DC | PRN
  Administered 2018-12-13 – 2018-12-14 (×2): 1000 mg via ORAL
  Filled 2018-12-13 (×2): qty 2

## 2018-12-13 MED ORDER — FLUOXETINE HCL 20 MG PO CAPS
20.0000 mg | ORAL_CAPSULE | Freq: Every evening | ORAL | Status: DC
  Administered 2018-12-13: 20 mg via ORAL
  Filled 2018-12-13 (×2): qty 1

## 2018-12-13 MED ORDER — ATORVASTATIN CALCIUM 80 MG PO TABS
80.0000 mg | ORAL_TABLET | Freq: Every day | ORAL | Status: DC
  Administered 2018-12-13: 80 mg via ORAL
  Filled 2018-12-13 (×2): qty 1

## 2018-12-13 MED ORDER — CLOPIDOGREL BISULFATE 75 MG PO TABS
75.0000 mg | ORAL_TABLET | Freq: Every day | ORAL | Status: DC
  Administered 2018-12-13 – 2018-12-14 (×2): 75 mg via ORAL
  Filled 2018-12-13 (×2): qty 1

## 2018-12-13 MED ORDER — INSULIN PUMP
Freq: Every day | SUBCUTANEOUS | Status: DC
  Administered 2018-12-13: 0.8 via SUBCUTANEOUS
  Administered 2018-12-14: 1.5 via SUBCUTANEOUS
  Filled 2018-12-13: qty 1

## 2018-12-13 MED ORDER — OXYCODONE-ACETAMINOPHEN 5-325 MG PO TABS
2.0000 | ORAL_TABLET | Freq: Once | ORAL | Status: AC
  Administered 2018-12-13: 2 via ORAL
  Filled 2018-12-13: qty 2

## 2018-12-13 MED ORDER — FLUCONAZOLE 100 MG PO TABS
100.0000 mg | ORAL_TABLET | Freq: Every day | ORAL | Status: DC
  Administered 2018-12-13 – 2018-12-14 (×2): 100 mg via ORAL
  Filled 2018-12-13 (×2): qty 1

## 2018-12-13 MED ORDER — PANTOPRAZOLE SODIUM 40 MG PO TBEC
40.0000 mg | DELAYED_RELEASE_TABLET | Freq: Every day | ORAL | Status: DC
  Administered 2018-12-13 – 2018-12-14 (×2): 40 mg via ORAL
  Filled 2018-12-13 (×2): qty 1

## 2018-12-13 MED ORDER — LEVOTHYROXINE SODIUM 75 MCG PO TABS
175.0000 ug | ORAL_TABLET | Freq: Every day | ORAL | Status: DC
  Administered 2018-12-13 – 2018-12-14 (×2): 175 ug via ORAL
  Filled 2018-12-13 (×3): qty 1

## 2018-12-13 MED ORDER — POLYETHYLENE GLYCOL 3350 17 G PO PACK
17.0000 g | PACK | Freq: Every day | ORAL | Status: DC | PRN
  Administered 2018-12-13: 17 g via ORAL
  Filled 2018-12-13: qty 1

## 2018-12-13 MED ORDER — LORATADINE 10 MG PO TABS
10.0000 mg | ORAL_TABLET | Freq: Every day | ORAL | Status: DC
  Administered 2018-12-13: 10 mg via ORAL
  Filled 2018-12-13 (×2): qty 1

## 2018-12-13 MED ORDER — LOSARTAN POTASSIUM 50 MG PO TABS
100.0000 mg | ORAL_TABLET | Freq: Every day | ORAL | Status: DC
  Administered 2018-12-13 – 2018-12-14 (×2): 100 mg via ORAL
  Filled 2018-12-13 (×2): qty 2

## 2018-12-13 MED ORDER — METOPROLOL SUCCINATE ER 25 MG PO TB24
50.0000 mg | ORAL_TABLET | Freq: Every day | ORAL | Status: DC
  Administered 2018-12-13 – 2018-12-14 (×2): 50 mg via ORAL
  Filled 2018-12-13: qty 1
  Filled 2018-12-13: qty 2

## 2018-12-13 MED ORDER — DICLOFENAC SODIUM 1 % TD GEL
4.0000 g | Freq: Four times a day (QID) | TRANSDERMAL | Status: DC | PRN
  Administered 2018-12-13 – 2018-12-14 (×2): 4 g via TOPICAL
  Filled 2018-12-13: qty 100

## 2018-12-13 MED ORDER — ONDANSETRON 4 MG PO TBDP
4.0000 mg | ORAL_TABLET | Freq: Three times a day (TID) | ORAL | Status: DC | PRN
  Filled 2018-12-13: qty 1

## 2018-12-13 NOTE — ED Notes (Addendum)
PT ambulated slightly unsteady with walker. Stated stiffness on right side of hip. PT stated she's walking worse than usual. Did not require additional assistance.

## 2018-12-13 NOTE — Progress Notes (Signed)
CSW acknowledges consult for possible SNF placement CSW spoke with pt at bedside and was informed that pt is from home with family. Pt also expressed that she was offered SNF rehab in the past however declined due to other reasons. CSW updated pt on SNF placement process and pt understanding of the need for PT to see pt before process can be begun. CSW did received verbal permission to send information out on pt once available. CSW will continue to follow for further SNF needs.    [name], MSW, [facility] Clinical Social Worker [phone]

## 2018-12-13 NOTE — ED Notes (Signed)
Pt ambulated with walker and 1 assist to restroom.  Pt tolerated well.

## 2018-12-13 NOTE — ED Provider Notes (Signed)
TIME SEEN: 5:29 AM  CHIEF COMPLAINT: Right knee pain  HPI: Patient is a 67 year old female with history of hypertension, hyperlipidemia, CHF, stroke with left-sided weakness, anxiety, IDDM, gastroparesis who presents to the emergency department with right knee pain.  States she got up to go to the bathroom and her right knee twisted and buckled.  She did not fall to the ground.  States she is having increased pain in the right knee and has been unable to ambulate since.  She has been using a walker at home since her stroke in October.  She states she is very concerned about how she is going to care for herself at home given she is unable to walk.  This is causing her to feel very anxious.  She states "I just wish I was not around so I was not a burden on my family".  She denies to me any active SI or HI.  When asked if she has thoughts of wanting her to hurt herself she states "I would never do that because I am a Christian".  PCP - [facility]  ROS: See HPI Constitutional: no fever  Eyes: no drainage  ENT: no runny nose   Cardiovascular:  no chest pain  Resp: no SOB  GI: no vomiting GU: no dysuria Integumentary: no rash  Allergy: no hives  Musculoskeletal: no leg swelling  Neurological: no slurred speech ROS otherwise negative  PAST MEDICAL HISTORY/PAST SURGICAL HISTORY:  Past Medical History:  Diagnosis Date  . Anemia   . Arthritis   . CHF (congestive heart failure) ([place])   . Depression   . GERD (gastroesophageal reflux disease)   . Hemorrhoids   . Hyperlipidemia   . Hypertension   . Hypothyroidism   . Seasonal allergies   . Stroke ([place])   . Thyroid cancer ([place])    per pt S/p Total Thyroidectomy with Radioactive Iodine Therapy  . Type 1 diabetes mellitus (H[facility])     MEDICATIONS:  Prior to Admission medications   Medication Sig Start Date End Date Taking? Authorizing Provider  acetaminophen (TYLENOL) 500 MG tablet Take 1,000 mg by mouth as needed for mild pain.     [provider]  atorvastatin (LIPITOR) 80 MG tablet TAKE 1 TABLET BY MOUTH DAILY Patient taking differently: Take 80 mg by mouth daily at 6 PM.  10/17/18   Shirley, [name], DO  clopidogrel (PLAVIX) 75 MG tablet TAKE 1 TABLET BY MOUTH EVERY DAY Patient taking differently: Take 75 mg by mouth daily.  07/15/18   Shirley, [name], DO  CRANBERRY EXTRACT PO Take 1 capsule by mouth daily.    [provider]  diclofenac sodium (VOLTAREN) 1 % GEL Apply 4 g topically 4 (four) times daily. Patient taking differently: Apply 4 g topically 4 (four) times daily as needed (for back pain).  11/11/18   Shirley, [name], DO  fexofenadine (ALLEGRA) 30 MG tablet Take 30 mg by mouth daily as needed (allergies).     [provider]  fluconazole (DIFLUCAN) 100 MG tablet Take 1 tablet (100 mg total) by mouth daily. 12/05/18   Doran Stabler, MD  FLUoxetine (PROZAC) 20 MG tablet Take 1 tablet (20 mg total) by mouth daily. Patient taking differently: Take 20 mg by mouth every evening.  12/09/18   Shirley, [name], DO  Insulin Human (INSULIN PUMP) SOLN Inject into the skin continuous. humalog insulin    [provider]  levothyroxine (SYNTHROID, LEVOTHROID) 175 MCG tablet Take 175 mcg by mouth  daily before breakfast.  06/06/17   [provider]  losartan (COZAAR) 100 MG tablet TAKE 1 TABLET BY MOUTH DAILY Patient taking differently: Take 100 mg by mouth daily.  12/03/18   Shirley, [name], DO  metoprolol succinate (TOPROL-XL) 50 MG 24 hr tablet TAKE 1 TABLET BY MOUTH DAILY Patient taking differently: Take 50 mg by mouth daily.  10/17/18   Shirley, [name], DO  pantoprazole (PROTONIX) 40 MG tablet Take 1 tablet (40 mg total) by mouth daily. 12/09/18   Shirley, [name], DO    ALLERGIES:  Allergies  Allergen Reactions  . Latex Rash    SOCIAL HISTORY:  Social History   Tobacco Use  . Smoking status: Never Smoker  . Smokeless tobacco: Never Used  Substance Use Topics  .  Alcohol use: No    FAMILY HISTORY: Family History  Problem Relation Age of Onset  . Heart disease Mother   . Hyperlipidemia Mother   . Hypertension Mother   . Renal Disease Mother        insufficiency  . Heart attack Mother        in his 69's  . Early death Father        Head Injury at Work  . Hyperlipidemia Father   . Hypertension Father   . Stroke Son   . Diabetes Maternal Aunt   . Prostate cancer Paternal Uncle   . Heart disease Maternal Aunt   . Heart failure Maternal Grandfather   . Heart disease Maternal Grandfather   . Heart attack Maternal Grandfather        Died in his 70's  . Breast cancer Neg Hx     EXAM: BP (!) 146/78   Pulse 74   Temp 98.2 F (36.8 C) (Oral)   Resp 20   SpO2 100%  CONSTITUTIONAL: Alert and oriented and responds appropriately to questions. Well-appearing; well-nourished HEAD: Normocephalic, atraumatic EYES: Conjunctivae clear, pupils appear equal, EOMI ENT: normal nose; moist mucous membranes NECK: Supple, no meningismus, no nuchal rigidity, no LAD  CARD: RRR; S1 and S2 appreciated; no murmurs, no clicks, no rubs, no gallops RESP: Normal chest excursion without splinting or tachypnea; breath sounds clear and equal bilaterally; no wheezes, no rhonchi, no rales, no hypoxia or respiratory distress, speaking full sentences ABD/GI: Normal bowel sounds; non-distended; soft, non-tender, no rebound, no guarding, no peritoneal signs, no hepatosplenomegaly BACK:  The back appears normal and is non-tender to palpation, there is no CVA tenderness EXT: Tender to palpation over the right anterior knee without joint effusion or deformity.  She has pain with flexion of the right knee.  Normal movement of the right hip without tenderness.  No tenderness in the tibia, fibula, ankle, foot of the right leg.  2+ right and left DP pulses bilaterally.  Compartments are soft.  No calf tenderness or swelling.  No edema noted.  No redness or warmth. SKIN: Normal color  for age and race; warm; no rash NEURO: Moves all extremities equally, mild weakness in the left upper and lower extremity compared to the right, normal sensation diffusely, normal speech PSYCH: Patient very anxious, agitated.  Denies SI or HI.  MEDICAL DECISION MAKING: Patient here with injury to the right knee.  This is causing her to become very anxious she is very worried that she will not be able to walk.  Have recommended that we try pain medication and obtain an x-ray.  She feels that she is not good to be able to go home.  She  has no other injury examination.  She feels she needs to go to a rehab facility.  She has no other acute medical complaints today.  She tells me initially that she wishes she was no longer here so that she would not be a burden to her family but denies active SI or HI and states that she would never harm herself because of her religious beliefs.  She does state that if she was discharged she can contract for safety.  She does not feel she needs to see a psychiatrist at this time.  I do not feel she needs to see TTS emergently.  I feel a lot of her complaints and concerns are based on anxiety.  ED PROGRESS: Patient's x-ray shows no acute abnormalities.  Will attempt to ambulate in the ED. she reports her pain is better.  She agrees to try to ambulate.  She is asking for placement in a rehab facility.  She states she was offered rehabilitation after her stroke but declined and states she feels like she now needs it.  She agrees to see case management, social work and physical therapy in the morning.  She understands that she may have to pay for services out of pocket.   6:33 AM  Pt able to ambulate with walker which is her baseline.  No significant assistance required.  She still states she does not feel safe going home and feels like she needs placement into a rehab facility.  At this time I do not have any reason for hospitalization.  She was just hospitalized on 12/02/2018 for  nausea, vomiting and diarrhea in the setting of gastroparesis.  She was admitted to the hospital in 10/15/2018 for acute right pontine infarct.  She will see CM and SW in AM to help with possible placement versus discharge home with outpatient home health resources.  States she currently lives at home with her family.   I reviewed all nursing notes, vitals, pertinent previous records, EKGs, lab and urine results, imaging (as available).      Syra Sirmons, Delice Bison, DO 12/13/18 [phone]

## 2018-12-13 NOTE — ED Notes (Signed)
Patient given Coke and gram crackers for nightly snack to help with low BSs throughout the night-Monique,RN

## 2018-12-13 NOTE — Evaluation (Signed)
Physical Therapy Evaluation Patient Details Name: [name] MRN: [id number] DOB: 20-Oct-1951 Today's Date: 12/13/2018   History of Present Illness  Patient is a 67 year old female with history of hypertension, hyperlipidemia, CHF, stroke with left-sided weakness, anxiety, IDDM, gastroparesis who presents to the emergency department with right knee pain and difficulty ambulating after twisting it and almost falling at home.   Clinical Impression  Patient presents with decreased mobility due to pain R knee, previous CVA in October this year with sensory deficit and reports balance issues prior to stroke.  She lives alone and feels more at risk for falls now due to knee pain with noted lateral instability with ligamentous testing.  She also seems to have significant fear of falling which also may increase fall risk.  Feel she will benefit from STSNF level rehab to maximize safety, independence, balance prior to d/c home with intermittent assist.     Follow Up Recommendations SNF;Supervision/Assistance - 24 hour    Equipment Recommendations  None recommended by PT    Recommendations for Other Services       Precautions / Restrictions Precautions Precautions: Fall      Mobility  Bed Mobility Overal bed mobility: Needs Assistance Bed Mobility: Supine to Sit;Sit to Supine     Supine to sit: HOB elevated;Mod assist Sit to supine: Mod assist   General bed mobility comments: heavy lifting help from supine, assist for legs into stretcher to supine  Transfers Overall transfer level: Needs assistance Equipment used: Rolling walker (2 wheeled) Transfers: Sit to/from Stand Sit to Stand: Min guard         General transfer comment: up from tall stretcher, painful R knee, to stretcher with assist due to imbalance sitting on high surface  Ambulation/Gait Ambulation/Gait assistance: Min guard Gait Distance (Feet): 150 Feet Assistive device: Rolling walker (2  wheeled) Gait Pattern/deviations: Step-to pattern;Step-through pattern;Antalgic;Decreased stride length;Trunk flexed     General Gait Details: cautious and fearful of knee going out, mild antalgia, but worst pain with sit<>stand  Stairs            Wheelchair Mobility    Modified Rankin (Stroke Patients Only)       Balance Overall balance assessment: Needs assistance Sitting-balance support: Feet unsupported Sitting balance-Leahy Scale: Poor Sitting balance - Comments: UE support needed sitting EOB due to tall seat with little foot support and pt leaning back   Standing balance support: Bilateral upper extremity supported Standing balance-Leahy Scale: Poor Standing balance comment: reliant on UE support                             Pertinent Vitals/Pain Pain Assessment: 0-10 Pain Score: 6  Pain Location: R knee with mobility  Pain Descriptors / Indicators: Aching;Sharp Pain Intervention(s): Monitored during session;Repositioned;Ice applied    Home Living Family/patient expects to be discharged to:: Private residence Living Arrangements: Other relatives(sister) Available Help at Discharge: Family;Available PRN/intermittently Type of Home: House Home Access: Stairs to enter Entrance Stairs-Rails: None Entrance Stairs-Number of Steps: 1 Home Layout: Two level;Able to live on main level with bedroom/bathroom Home Equipment: Walker - 4 wheels;Shower seat;Bedside commode;Grab bars - tub/shower;Hand held shower head      Prior Function Level of Independence: Needs assistance   Gait / Transfers Assistance Needed: was independent walking with rollator in the home, reports finished HHPT and they referred for outpatient but she could not go due to tranportation issues  ADL's / Homemaking Assistance Needed:  sister cooks, cleans and different family members drive, pt doing all this prior to CVA in Oct        Hand Dominance   Dominant Hand: Right     Extremity/Trunk Assessment   Upper Extremity Assessment Upper Extremity Assessment: LUE deficits/detail LUE Deficits / Details: painful with shoulder elevation, strength grossly 4-4+/5 LUE Sensation: decreased light touch    Lower Extremity Assessment Lower Extremity Assessment: RLE deficits/detail;LLE deficits/detail RLE Deficits / Details: AROM painful with flexion grossly WFL, strength hip flexion 3+/5, knee extension 4/5, ankle DF 4+/5; positive varus stress with increased laxity compared to L, minimal palpable edema, but decreased patellar mobility noted bilaterally RLE Sensation: WNL LLE Deficits / Details: AROM WFL, strength grossly 4/5 and decreased sensation LLE Sensation: decreased light touch       Communication   Communication: No difficulties  Cognition Arousal/Alertness: Awake/alert Behavior During Therapy: Anxious Overall Cognitive Status: Within Functional Limits for tasks assessed                                        General Comments General comments (skin integrity, edema, etc.): sister in room and reports pt with fall alert button, states she works and no family to assist with transport to appointments    Exercises     Assessment/Plan    PT Assessment Patient needs continued PT services  PT Problem List Decreased strength;Decreased balance;Pain;Decreased activity tolerance;Decreased safety awareness;Decreased mobility;Decreased range of motion       PT Treatment Interventions DME instruction;Functional mobility training;Balance training;Patient/family education;Gait training;Therapeutic activities;Therapeutic exercise    PT Goals (Current goals can be found in the Care Plan section)  Acute Rehab PT Goals Patient Stated Goal: to go to rehab to be more independent PT Goal Formulation: With patient/family Time For Goal Achievement: 12/20/18 Potential to Achieve Goals: Good    Frequency Min 3X/week   Barriers to discharge         Co-evaluation               AM-PAC PT "6 Clicks" Mobility  Outcome Measure Help needed turning from your back to your side while in a flat bed without using bedrails?: A Lot Help needed moving from lying on your back to sitting on the side of a flat bed without using bedrails?: A Lot Help needed moving to and from a bed to a chair (including a wheelchair)?: A Little Help needed standing up from a chair using your arms (e.g., wheelchair or bedside chair)?: A Little Help needed to walk in hospital room?: A Little Help needed climbing 3-5 steps with a railing? : A Lot 6 Click Score: 15    End of Session Equipment Utilized During Treatment: Gait belt Activity Tolerance: Patient limited by pain Patient left: in bed;with call bell/phone within reach;with family/visitor present   PT Visit Diagnosis: Difficulty in walking, not elsewhere classified (R26.2);Other abnormalities of gait and mobility (R26.89);Pain Pain - Right/Left: Right Pain - part of body: Knee    Time: 0092-3300 PT Time Calculation (min) (ACUTE ONLY): 34 min   Charges:   PT Evaluation $PT Eval Moderate Complexity: 1 Mod PT Treatments $Gait Training: 8-22 mins        Magda Kiel, [place] [facility] [phone] 12/13/2018   Reginia Naas 12/13/2018, 9:38 AM

## 2018-12-13 NOTE — Progress Notes (Addendum)
Inpatient Diabetes Program Recommendations  AACE/ADA: New Consensus Statement on Inpatient Glycemic Control (2015)  Target Ranges:  Prepandial:   less than 140 mg/dL      Peak postprandial:   less than 180 mg/dL (1-2 hours)      Critically ill patients:  140 - 180 mg/dL   Results for NOMIE, BUCHBERGER (MRN [id number]) as of 12/13/2018 13:51  Ref. Range 12/13/2018 07:38  Glucose-Capillary Latest Ref Range: 70 - 99 mg/dL 228 (H)    Admit with: Knee Pain--Needs Rehab placement  History: Diabetes  Home DM Meds: Insulin Pump  Current Orders: Insulin Pump    Late Entry:  Met with pt around 1pm.  Pt A&O and able to independently manage insulin pump.  Has pump on and running.  Pump insertion site on R upper Abdomen.  Due to change set/site/insulin/reservoir tomorrow (12/21).  Per pt, sister at bedside can go home to get her insulin pump supplies when needed.  Pt currently awaiting Rehab placement to [facility].  When I met with pt, pt told me she checks her CBGs quite often (usually before and after meals and as needed).  Let's her CBGs run in the 200 range sometimes b/c of fear of Hypoglycemia.  Stated to me that her Endocrinologist ([name]) knows that she let's her CBGs run a little high at home.  Stated to me that Dr. Willis Modena would like for pt to get her A1c closer to 8%.  Explained to pt that I would talk with the ED MD and get orders to allow her (the pt) to use her insulin pump while she is here in the ED.  Spoke with Dr. Darl Householder (ED MD) in person and he gave me permission to enter the Insulin Pump orders for pt into Epic/CHL.  Orders entered and I also alerted RN that I would be placing the orders as well.  Reviewed Insulin Pump settings with pt and they are as follows:  --Insulin Pump Settings--  Basal Rates: 4am- 0.8 units/hr 9am- 1.3 units/hr 12pm- 0.95 units/hr 1:30pm- 1.15 units/hr 3pm- 1 units/hr 6pm- 1.1 units/hr 7:30pm- 0.95 units/hr 12am- 0.55  units/hr  Total Basal Insulin per 24 hours period= 22.175 units  Carbohydrate Ratio: 1 unit for every 6 Grams of Carbohydrates  Correction/Sensitivity Factor:  12am= 1 unit for every 60 mg/dl above Target CBG 5am= 1 unit for every 60 mg/dl above Target CBG 8am= 1 unit for every 50 mg/dl above Target CBG 8pm= 1 unit for every 60 mg/dl above Target CBG  Target CBG: 140 mg/dl    If SNF Rehab facility will allow pt to use Insulin Pump, recommend she be allowed to use her pump.  If the SNF Rehab facility will not allow an Insulin pump, recommend the following regimen for the SNF: Lantus 22 units Daily Novolog Sensitive Correction Scale/ SSI (0-9 units) TID AC + HS Novolog 4 units TID for meal Coverage     --Will follow patient during hospitalization--  Wyn Quaker RN, MSN, CDE Diabetes Coordinator Inpatient Glycemic Control Team Team Pager: [phone] (8a-5p)

## 2018-12-13 NOTE — Progress Notes (Signed)
CSW spoke with pt at bedside to give PT recommendation. Pt agreeable to SNF and expressed that pt would be willing to go to the first facility that chose pt. Pt offered bed from [facility]. [place] starting auth at this time. CSW to received call from facility once auth has been received.     Virgie Dad Thelma Viana, MSW, [place] [facility] Clinical Social Worker [phone]

## 2018-12-13 NOTE — ED Notes (Signed)
PT at bedside.

## 2018-12-13 NOTE — ED Notes (Signed)
Pt ambulated to restroom with walker and 1 assist.   Tolerated well.

## 2018-12-13 NOTE — NC FL2 (Signed)
[place] LEVEL OF CARE SCREENING TOOL     IDENTIFICATION  Patient Name: [name] Birthdate: Apr 06, 1951 Sex: female Admission Date (Current Location): 12/13/2018  [facility] and [place] Number:  Herbalist and Address:  [name], [place] [address], [place], [place] [address]      Provider Number: [id number]  Attending Physician Name and Address:  Default, Provider, MD  Relative Name and Phone Number:       Current Level of Care: Hospital Recommended Level of Care: [place] Prior Approval Number:    Date Approved/Denied:   PASRR Number:   [id number]   Discharge Plan: SNF    Current Diagnoses: Patient Active Problem List   Diagnosis Date Noted  . Hot flashes 12/11/2018  . 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 Abnormal echocardiogram   . Stroke (cerebrum) ([place]) 10/13/2018  . Frequent PVCs   . Uncontrolled type 1 diabetes mellitus ([place])   . BPPV (benign paroxysmal positional vertigo), right 06/13/2017  . Premature atrial beats 06/13/2017  . Snoring 07/31/2016  . Pre-ulcerative corn or callous 12/15/2015  . History of CHF (congestive heart failure) 10/29/2015  . Essential hypertension 10/29/2015  . Cerebrovascular accident (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 ([place]) 10/29/2015  . Thyroid activity decreased   . Intracranial vascular stenosis   . HLD (hyperlipidemia)   . Cerebral infarction due to thrombosis of basilar artery ([place])   . CVA (cerebral vascular accident) ([place]) 08/29/2015  . Posterior tibial tendinitis of right leg 04/26/2014  . Atypical chest pain 10/06/2013  . Morbid obesity ([place]) 05/29/2013  . Back pain without radiation 04/28/2013  . Nail dystrophy 01/10/2013  . Diabetes mellitus ([place]) 01/25/2012  . Hyperlipidemia 01/25/2012  . Hypothyroidism 01/25/2012  . Hypertension 01/25/2012  . GERD (gastroesophageal reflux disease) 01/25/2012    Orientation  RESPIRATION BLADDER Height & Weight     Self, Time, Situation, Place  Normal Continent Weight:   Height:     BEHAVIORAL SYMPTOMS/MOOD NEUROLOGICAL BOWEL NUTRITION STATUS      Continent Diet(please see AVS)  AMBULATORY STATUS COMMUNICATION OF NEEDS Skin   Extensive Assist Verbally Normal                       Personal Care Assistance Level of Assistance  Feeding, Dressing, Bathing Bathing Assistance: Maximum assistance Feeding assistance: Limited assistance Dressing Assistance: Maximum assistance     Functional Limitations Info  Sight, Hearing, Speech Sight Info: Adequate Hearing Info: Adequate Speech Info: Adequate    SPECIAL CARE FACTORS FREQUENCY  PT (By licensed PT), OT (By licensed OT)     PT Frequency: 5 times a week  OT Frequency: 5 times a week             Contractures Contractures Info: Not present    Additional Factors Info  Code Status, Allergies, Insulin Sliding Scale Code Status Info: Prior  Allergies Info:  Latex           Current Medications (12/13/2018):  This is the current hospital active medication list Current Facility-Administered Medications  Medication Dose Route Frequency Provider Last Rate Last Dose  . acetaminophen (TYLENOL) tablet 1,000 mg  1,000 mg Oral PRN Ward, Kristen N, DO      . atorvastatin (LIPITOR) tablet 80 mg  80 mg Oral Daily Ward, Kristen N, DO      . clopidogrel (PLAVIX) tablet 75 mg  75 mg Oral Daily Ward, Cyril Mourning  N, DO      . diclofenac sodium (VOLTAREN) 1 % transdermal gel 4 g  4 g Topical QID PRN Ward, Kristen N, DO      . fluconazole (DIFLUCAN) tablet 100 mg  100 mg Oral Daily Ward, Kristen N, DO      . FLUoxetine (PROZAC) capsule 20 mg  20 mg Oral QPM Ward, Kristen N, DO      . levothyroxine (SYNTHROID, LEVOTHROID) tablet 175 mcg  175 mcg Oral QAC breakfast Ward, Kristen N, DO      . loratadine (CLARITIN) tablet 10 mg  10 mg Oral Daily Ward, Kristen N, DO      . losartan (COZAAR) tablet 100 mg  100 mg Oral  Daily Ward, Kristen N, DO      . metoprolol succinate (TOPROL-XL) 24 hr tablet 50 mg  50 mg Oral Daily Ward, Kristen N, DO      . ondansetron (ZOFRAN-ODT) disintegrating tablet 4 mg  4 mg Oral Q8H PRN Ward, Kristen N, DO      . pantoprazole (PROTONIX) EC tablet 40 mg  40 mg Oral Daily Ward, Kristen N, DO       Current Outpatient Medications  Medication Sig Dispense Refill  . acetaminophen (TYLENOL) 500 MG tablet Take 1,000 mg by mouth as needed for mild pain.    Marland Kitchen atorvastatin (LIPITOR) 80 MG tablet TAKE 1 TABLET BY MOUTH DAILY (Patient taking differently: Take 80 mg by mouth daily at 6 PM. ) 90 tablet 0  . clopidogrel (PLAVIX) 75 MG tablet TAKE 1 TABLET BY MOUTH EVERY DAY (Patient taking differently: Take 75 mg by mouth daily. ) 90 tablet 0  . CRANBERRY EXTRACT PO Take 1 capsule by mouth daily.    . diclofenac sodium (VOLTAREN) 1 % GEL Apply 4 g topically 4 (four) times daily. (Patient taking differently: Apply 4 g topically 4 (four) times daily as needed (for back pain). ) 100 g 2  . fexofenadine (ALLEGRA) 30 MG tablet Take 30 mg by mouth daily as needed (allergies).     . fluconazole (DIFLUCAN) 100 MG tablet Take 1 tablet (100 mg total) by mouth daily. 14 tablet 0  . FLUoxetine (PROZAC) 20 MG tablet Take 1 tablet (20 mg total) by mouth daily. (Patient taking differently: Take 20 mg by mouth every evening. ) 90 tablet 0  . Insulin Human (INSULIN PUMP) SOLN Inject into the skin continuous. humalog insulin    . levothyroxine (SYNTHROID, LEVOTHROID) 175 MCG tablet Take 175 mcg by mouth daily before breakfast.   12  . losartan (COZAAR) 100 MG tablet TAKE 1 TABLET BY MOUTH DAILY (Patient taking differently: Take 100 mg by mouth daily. ) 90 tablet 0  . metoprolol succinate (TOPROL-XL) 50 MG 24 hr tablet TAKE 1 TABLET BY MOUTH DAILY (Patient taking differently: Take 50 mg by mouth daily. ) 90 tablet 0  . pantoprazole (PROTONIX) 40 MG tablet Take 1 tablet (40 mg total) by mouth daily. 90 tablet 0      Discharge Medications: Please see discharge summary for a list of discharge medications.  Relevant Imaging Results:  Relevant Lab Results:   Additional Information [phone].   Wetzel Bjornstad, LCSWA

## 2018-12-13 NOTE — ED Triage Notes (Signed)
Pt arrives via [facility] from home for c/o R knee pain that began yesterday morning. Pt states that her knee gave way this am and she caught herself on the chair. She denies any injury that caused her knee pain initially.  Ems vss: bp 144/80, hr 77.

## 2018-12-13 NOTE — ED Notes (Addendum)
Spoke with physical therapy to confirm they will see pt soon as discharge is pending evaluation.   Reginia Naas PT pager [phone].

## 2018-12-13 NOTE — ED Notes (Signed)
Pt has lunch tray at bedside  

## 2018-12-13 NOTE — ED Provider Notes (Signed)
  Physical Exam  BP (!) 127/46   Pulse 74   Temp 98.2 F (36.8 C) (Oral)   Resp 20   SpO2 100%   Physical Exam  ED Course/Procedures     Procedures  MDM  Patient here with deconditioning. No medical reason for admission. Social work consulted for placement.   1 pm Social work and Physical therapy saw patient. Recommend rehab. Able to get bed at [facility] place. Will transfer when there is a bed.   3:49 PM Bed pending at [facility] place. Dr. Melina Copa aware       Drenda Freeze, MD 12/13/18 [phone]

## 2018-12-13 NOTE — ED Notes (Signed)
Patient transported to X-ray 

## 2018-12-14 ENCOUNTER — Other Ambulatory Visit: Payer: Self-pay

## 2018-12-14 ENCOUNTER — Encounter: Payer: Self-pay | Admitting: Family Medicine

## 2018-12-14 LAB — CBG MONITORING, ED
GLUCOSE-CAPILLARY: 305 mg/dL — AB (ref 70–99)
Glucose-Capillary: 115 mg/dL — ABNORMAL HIGH (ref 70–99)
Glucose-Capillary: 138 mg/dL — ABNORMAL HIGH (ref 70–99)
Glucose-Capillary: 191 mg/dL — ABNORMAL HIGH (ref 70–99)
Glucose-Capillary: 267 mg/dL — ABNORMAL HIGH (ref 70–99)

## 2018-12-14 NOTE — ED Notes (Signed)
Pt ambulated to nurses' desk - stating she wanted to RN to move recliner over so bed can be moved over to hopefully reduce amount of air she is feeling from overhead vent. Advised pt recliner would have to be removed from room d/t no room - voiced understanding. Room adjusted for pt. Pt laid down on bed herself w/no assistance. RN standing by. Pt shown how to adjust bed x 2 and encouraged pt to rest as she has not slept.

## 2018-12-14 NOTE — ED Notes (Addendum)
Pt sat on bed and requested assistance w/lying down on bed. States she is unable to place her legs up in bed "because of my stroke. It affected my legs". Assisted pt - tv channel changed as requested. Blankets at pt's side as requested. Tylenol given for c/o chronic back pain as requested. States "I need that ointment put on my back again. I can have it every 6 hours". Advised pt it has been 4 hours since last applied. Pt voiced understanding.

## 2018-12-14 NOTE — ED Notes (Signed)
Pt voiced understanding of d/c instructions - as well as pt's son and sister. Sign pad not working. Escorted pt into vehicle w/sister via w/c as requested.

## 2018-12-14 NOTE — ED Notes (Addendum)
Patient ambulated to the bathroom with walker w/o assistance; patient  Returns to room and states she does not feel good; pt request CBG; CBg=115; pt states that is low for her and "I feel like I'm dying"; pt is a&ox 4; pt given OJ to drink and is now sitting on the side of the bed-Monique,RN

## 2018-12-14 NOTE — ED Notes (Signed)
Pt sitting in recliner in room - wearing jacket backwards - requested for RN to place it on correctly - RN assisted pt and encouraged pt to do for herself as much as she can. Pt stated she feels temp is cold in room - offered to increase heat - declined - states "I'll be sweating in a few min. This is what I do at home, too".

## 2018-12-14 NOTE — ED Notes (Signed)
Pt back out of bed in chair.

## 2018-12-14 NOTE — ED Notes (Addendum)
Joey, SW, and Dr Winfred Leeds in w/pt, son, and sister. Pt voiced understanding to follow up w/PCP and [facility].

## 2018-12-14 NOTE — ED Notes (Signed)
Pt has ambulated from room to bathroom w/walker w/o difficulty.

## 2018-12-14 NOTE — ED Notes (Signed)
Pt ambulated to nurses' desk w/walker - asking for "PT to come walk w/me". States "they told me someone would do that". Advised pt PT performs consults in ED and they do not routinely ambulate w/pt's on daily basis. Pt returned to her room.

## 2018-12-14 NOTE — ED Notes (Signed)
Blankets placed on pt as requested d/t stated she was unable to reach them from the bottom of bed. Pt had covered self earlier and had removed them.

## 2018-12-14 NOTE — ED Notes (Signed)
Pt eating lunch

## 2018-12-14 NOTE — ED Notes (Signed)
Joey, SW, in w/pt.  

## 2018-12-14 NOTE — ED Provider Notes (Signed)
I was called to speak with patient after she was saying she wanted to go home instead of placement in the SNF.  Patient felt that she was being ignored here as other people have sitters and one-to-one monitoring and she did not.  It was explained to the patient that she was here for different reasons in these patients and that she did not require one-to-one monitoring.  Patient understood this after the explanation.  Patient also is concerned about her thyroid medication scheduling.  She reports her doctor told her that she needs to take it 1 hour before eating and her other medications, however pharmacy schedules the medication at 8 AM.  Sometimes breakfast is available at 8 AM and sometimes it is not.  I spoke with nursing about this and I advised that they could delay eating and the other medications if the patient felt she would like to wait.  Patient also concerned about her blood sugars and how her endocrinologist advises every 2 hour CBG checks.  She has been getting every 4 hours CBG checks.  I will schedule every 2 hour CBG checks instead.   Frederica Kuster, PA-C 12/14/18 [place], [place], MD 12/14/18 [phone]

## 2018-12-14 NOTE — ED Notes (Signed)
Pt eating breakfast. Pt continuously asking for multiple things - ambulates to nurses' desk w/walker w/o difficulty. Pt's chair moved in room as requested - pt requested to eat breakfast, then pt stated she wants to lie down. RN encouraged pt to eat breakfast then can lay down. Advised pt RN will be performing hourly rounding and will address all of her needs at these times. Pt voiced understanding.

## 2018-12-14 NOTE — ED Notes (Signed)
Patient requested to put on regular clothes; pt has removed hospital gown and is now where her street clothes-Monique,RN

## 2018-12-14 NOTE — ED Notes (Signed)
Pt's sister at bedside.

## 2018-12-14 NOTE — ED Notes (Signed)
Staff left room from assisting pt. Pt then seen sitting up in bed and uncovering herself from the waist up, grabbing something to put on her head, putting it on her head and then seen laying back down and covering herself back up with no assistance.

## 2018-12-14 NOTE — ED Notes (Signed)
CM spoke w/pt - pt now stating she wants to remain in ED and wait for SNF. States she became upset d/t states "I felt I was being mistreated because I don't have anyone w/me like the other pt's do". Advised pt some pt's have assigned Sitters and she does not meet the criteria to have a Sitter assigned to her. Allowed pt to vent feelings/concerns. Pt reported she is recording conversations w/her cell phone. Advised pt no recordings are allowed. Voiced understanding. PA spoke w/pt and allowed her to vent her concerns/feelings. Pt's family members have are leaving at this time.

## 2018-12-14 NOTE — ED Provider Notes (Signed)
Patient evaluated on morning rounds.  She ambulated with walker.  She reports her knee is still bothering her.  Her back is bothering her intermittently, but requesting her Voltaren gel to be applied, which is at bedside.  Asked nursing to assist her with this.  Patient reports persistent feeling hot and cold, and states she has been feeling this since her stroke last month.  CBGs regularly monitored.  Patient wearing insulin pump.  She has been accepted at [facility].   Frederica Kuster, PA-C 12/14/18 [id number]    Charlesetta Shanks, MD 12/14/18 [phone]

## 2018-12-14 NOTE — Care Management (Signed)
Spoke w patient at bedside. SHe states she is not at all interested in [facility]. She states hat she is active w [facility] for [facility] services, and she feels like she is not getting any better. SHe states that she wants to DC to SNF, and would like to do it as soon as possible as she feels like she is a burden to ED staff. CM notified CSW of patient's DC choice. CSW will follow up for placememt.  Carles Collet RN BSN CPN Case Management [phone] Please refer to [facility] for CM provider on call

## 2018-12-14 NOTE — ED Notes (Addendum)
Pt eating lunch. States she is "unable to eat a lot". Voiced understanding CBG 305 - initially refused to administer any insulin to self via Insulin Pump d/t states "I'm not eating that much" then pt advised she would administer 1.5 units - stating "I'm not going to give myself anymore than that". Pt requesting to speak w/SW. Pt requesting Soft Diet to be ordered d/t states "I need soft food".

## 2018-12-14 NOTE — Progress Notes (Signed)
CSW was informed by [facility] patient stated she did not want home health. CSW called [facility] where authorization is currently pending and left a HIPPA compliant V/M for follow up.  Lamonte Richer, LCSW, [place] Worker II [phone]

## 2018-12-14 NOTE — ED Provider Notes (Signed)
Pt alert, answers appropriately, NAD . Wishes to go home with family pending placement to rehab facility   Orlie Dakin, MD 12/14/18 [phone]

## 2018-12-14 NOTE — ED Notes (Addendum)
Pt ambulated to door - stating she would not be staying any longer. Advised pt she may leave and offered for her to call a family member. Pt returned to room. Son arrived to bedside - requesting to speak w/an "Administrator". States pt needs somewhere else to wait for nursing facility. Advised pt and son she may either wait in room or in hallway. Son advised after speaking w/pt he and she would rather for her to be d/c'd to home. States still wants to speak w/"Administrator".

## 2018-12-14 NOTE — Discharge Instructions (Addendum)
Contact your doctor to arrange for placement in to rehabilitation facility.

## 2018-12-14 NOTE — ED Notes (Signed)
Patient has continued to call out to staff to do simple task that she is able to do on her own; pt states she can lift her own legs in the bed but RN witnessed pt placing her own legs in the bed; pt calls out every 10 minutes for minor needs like pulling up her covers; RN explained to patient that she is currently in boarding status-Monique,RN

## 2018-12-14 NOTE — Progress Notes (Signed)
CSW informed by RN patient requested to go home and not go to a SNF. CSW consulted with RNCM. CSW will follow.  Lamonte Richer, LCSW, [place] Worker II [phone]

## 2018-12-14 NOTE — ED Notes (Addendum)
Pt's family member at bedside - assisting pt w/bathing as requested by pt. CM in w/pt as pt is requesting to be d/c'd to home.

## 2018-12-14 NOTE — ED Notes (Signed)
Patient has asked for assistance to move from the chair to the bed back to the chair and back to the bed agin a few times in the last 2 hours; pt continues to states she is unable to do for herself but when left alone patient is able to stand and walk on her own with no assitance-Monique,RN

## 2018-12-16 LAB — CBG MONITORING, ED: Glucose-Capillary: 175 mg/dL — ABNORMAL HIGH (ref 70–99)

## 2018-12-16 NOTE — Telephone Encounter (Signed)
Called and spoke with patient after receiving messages. She is going to a rehab facility after her fall as her family is not able to help her. She does not require anything further from me at this point. Answered all questions.   [name] Rosaura Bolon, DO PGY-2, [place] [facility]

## 2018-12-16 NOTE — Progress Notes (Unsigned)
CW spoke with pt in hallway. CSW informed that pt wasn't discharged home but was told that pt had to be sent home. CSW spoke with pt about [facility]. Pt report that [facility] called her this morning after getting auth and informed her that they could no longer take pt due to insulin pump. CSW spoke with Santiago Glad from [facility] who pt expressed she has been in contact with and was informed that they are able to take pt however they would need to submit for a new auth at this time.   Santiago Glad has submitted for auth from [facility] as well as CSW has sent over updated information to Santiago Glad at this time. Pt aware that Santiago Glad will follow up with pt at home once Josem Kaufmann has been received.    Virgie Dad Melonie Germani, MSW, [facility] Clinical Social Worker [phone]

## 2018-12-19 ENCOUNTER — Encounter: Payer: Self-pay | Admitting: Family Medicine

## 2018-12-23 ENCOUNTER — Other Ambulatory Visit: Payer: Self-pay

## 2018-12-23 ENCOUNTER — Encounter (HOSPITAL_COMMUNITY)
Admission: RE | Disposition: A | Discharge status: Rehabilitation Facility | Payer: Self-pay | Source: Home / Self Care | Attending: Cardiology

## 2018-12-23 ENCOUNTER — Ambulatory Visit (HOSPITAL_COMMUNITY)
Admission: RE | Admit: 2018-12-23 | Discharge: 2018-12-23 | Disposition: A | Discharge status: Rehabilitation Facility | Payer: Medicare Other | Attending: Cardiology | Admitting: Cardiology

## 2018-12-23 ENCOUNTER — Encounter (HOSPITAL_COMMUNITY): Payer: Self-pay | Admitting: Cardiology

## 2018-12-23 DIAGNOSIS — Z79890 Hormone replacement therapy (postmenopausal): Secondary | ICD-10-CM | POA: Insufficient documentation

## 2018-12-23 DIAGNOSIS — M1990 Unspecified osteoarthritis, unspecified site: Secondary | ICD-10-CM | POA: Insufficient documentation

## 2018-12-23 DIAGNOSIS — E119 Type 2 diabetes mellitus without complications: Secondary | ICD-10-CM | POA: Diagnosis not present

## 2018-12-23 DIAGNOSIS — Z955 Presence of coronary angioplasty implant and graft: Secondary | ICD-10-CM | POA: Diagnosis not present

## 2018-12-23 DIAGNOSIS — E785 Hyperlipidemia, unspecified: Secondary | ICD-10-CM | POA: Diagnosis not present

## 2018-12-23 DIAGNOSIS — Z833 Family history of diabetes mellitus: Secondary | ICD-10-CM | POA: Diagnosis not present

## 2018-12-23 DIAGNOSIS — E039 Hypothyroidism, unspecified: Secondary | ICD-10-CM | POA: Diagnosis not present

## 2018-12-23 DIAGNOSIS — Z794 Long term (current) use of insulin: Secondary | ICD-10-CM | POA: Diagnosis not present

## 2018-12-23 DIAGNOSIS — Z91040 Latex allergy status: Secondary | ICD-10-CM | POA: Diagnosis not present

## 2018-12-23 DIAGNOSIS — Z7902 Long term (current) use of antithrombotics/antiplatelets: Secondary | ICD-10-CM | POA: Insufficient documentation

## 2018-12-23 DIAGNOSIS — Z8249 Family history of ischemic heart disease and other diseases of the circulatory system: Secondary | ICD-10-CM | POA: Diagnosis not present

## 2018-12-23 DIAGNOSIS — K219 Gastro-esophageal reflux disease without esophagitis: Secondary | ICD-10-CM | POA: Insufficient documentation

## 2018-12-23 DIAGNOSIS — I639 Cerebral infarction, unspecified: Secondary | ICD-10-CM | POA: Diagnosis not present

## 2018-12-23 DIAGNOSIS — Z79899 Other long term (current) drug therapy: Secondary | ICD-10-CM | POA: Diagnosis not present

## 2018-12-23 DIAGNOSIS — I509 Heart failure, unspecified: Secondary | ICD-10-CM | POA: Diagnosis not present

## 2018-12-23 DIAGNOSIS — Z90711 Acquired absence of uterus with remaining cervical stump: Secondary | ICD-10-CM | POA: Insufficient documentation

## 2018-12-23 DIAGNOSIS — Z841 Family history of disorders of kidney and ureter: Secondary | ICD-10-CM | POA: Insufficient documentation

## 2018-12-23 DIAGNOSIS — Z823 Family history of stroke: Secondary | ICD-10-CM | POA: Insufficient documentation

## 2018-12-23 DIAGNOSIS — I110 Hypertensive heart disease with heart failure: Secondary | ICD-10-CM | POA: Diagnosis not present

## 2018-12-23 DIAGNOSIS — I6389 Other cerebral infarction: Secondary | ICD-10-CM

## 2018-12-23 HISTORY — PX: LOOP RECORDER INSERTION: EP1214

## 2018-12-23 LAB — GLUCOSE, CAPILLARY: GLUCOSE-CAPILLARY: 270 mg/dL — AB (ref 70–99)

## 2018-12-23 SURGERY — LOOP RECORDER INSERTION

## 2018-12-23 MED ORDER — LIDOCAINE-EPINEPHRINE 1 %-1:100000 IJ SOLN
INTRAMUSCULAR | Status: AC
  Filled 2018-12-23: qty 1

## 2018-12-23 MED ORDER — LIDOCAINE-EPINEPHRINE 1 %-1:100000 IJ SOLN
INTRAMUSCULAR | Status: DC | PRN
  Administered 2018-12-23: 30 mL

## 2018-12-23 MED ORDER — ACETAMINOPHEN 325 MG PO TABS
650.0000 mg | ORAL_TABLET | Freq: Once | ORAL | Status: AC
  Administered 2018-12-23: 650 mg via ORAL
  Filled 2018-12-23 (×2): qty 2

## 2018-12-23 SURGICAL SUPPLY — 2 items
LOOP REVEAL LINQSYS (Prosthesis & Implant Heart) ×3 IMPLANT
PACK LOOP INSERTION (CUSTOM PROCEDURE TRAY) ×3 IMPLANT

## 2018-12-23 NOTE — Discharge Instructions (Signed)
Implant site/wound care instructions °Keep incision clean and dry for 3 days. °You can remove outer dressing tomorrow. °Leave steri-strips (little pieces of tape) on until seen in the office for wound check appointment. °Call the office ([phone]) for redness, drainage, swelling, or fever. ° °

## 2018-12-23 NOTE — H&P (Signed)
[name] has presented today for surgery, with the diagnosis of cryptogenic stroke.  The various methods of treatment have been discussed with the patient and family. After consideration of risks, benefits and other options for treatment, the patient has consented to  Procedure(s): LINQ implant as a surgical intervention .  Risks include but not limited to bleeding, infection, among others. The patient's history has been reviewed, patient examined, no change in status, stable for surgery.  I have reviewed the patient's chart and labs.  Questions were answered to the patient's satisfaction.    Cyana Shook Curt Bears, MD 12/23/2018 7:13 AM

## 2018-12-24 ENCOUNTER — Telehealth: Payer: Self-pay | Admitting: Cardiology

## 2018-12-24 ENCOUNTER — Other Ambulatory Visit: Payer: Self-pay | Admitting: Cardiology

## 2018-12-24 NOTE — Telephone Encounter (Signed)
Patient called back and stated that she wanted to know if she could remove the patch over her loop recorder or does it have to be a nurse? Informed her that I would get RN recommendations and call her back. Pt verbalized understanding.

## 2018-12-24 NOTE — Telephone Encounter (Signed)
LVM for return call to address incision.

## 2018-12-24 NOTE — Telephone Encounter (Signed)
Spoke w/ pt and instructed her how to send a manual transmission w/ her home monitor. Transmission received. Informed her that a Device Tech will review and if anything is abnormal she will call otherwise no news is good news. Pt verbalized understanding.

## 2018-12-26 ENCOUNTER — Telehealth: Payer: Self-pay

## 2018-12-26 NOTE — Telephone Encounter (Signed)
Pt sent in remote transmission in response to her linq alert.

## 2018-12-27 ENCOUNTER — Ambulatory Visit: Payer: Medicare Other | Admitting: Internal Medicine

## 2018-12-27 ENCOUNTER — Encounter

## 2018-12-27 NOTE — Telephone Encounter (Signed)
Pt sent in remote transmission secondary to her linq alert on 1/3. Pt stated she removed the tegaderm and gauze from her site, leaving the steri strips. Pt knows to keep the steri strips on until her device check next week.

## 2018-12-27 NOTE — Telephone Encounter (Signed)
Dr. Curt Bears reviewed episodes through 12/27/18--advised ECGs indicate SR w/ectopy. Per Dr. Curt Bears, plan to reprogram AF episode storage to 41min, make AF detection "less sensitive" at upcoming wound check appointment on 01/02/19.

## 2018-12-30 ENCOUNTER — Encounter: Payer: Self-pay | Admitting: Family Medicine

## 2018-12-30 ENCOUNTER — Ambulatory Visit: Payer: Medicare Other | Admitting: Family Medicine

## 2018-12-30 ENCOUNTER — Ambulatory Visit: Payer: Medicare Other

## 2018-12-31 ENCOUNTER — Telehealth: Payer: Self-pay

## 2018-12-31 NOTE — Telephone Encounter (Signed)
Spoke with pt and helped her send a manual transmission.

## 2019-01-01 NOTE — Telephone Encounter (Signed)
Reviewed "AF" episodes--available ECGs false. Plan to reprogram LINQ due to false episodes at wound check on 01/02/19.

## 2019-01-02 ENCOUNTER — Ambulatory Visit (INDEPENDENT_AMBULATORY_CARE_PROVIDER_SITE_OTHER): Payer: Medicare Other | Admitting: Nurse Practitioner

## 2019-01-02 DIAGNOSIS — I639 Cerebral infarction, unspecified: Secondary | ICD-10-CM

## 2019-01-02 LAB — CUP PACEART INCLINIC DEVICE CHECK
Date Time Interrogation Session: 20200109091911
Implantable Pulse Generator Implant Date: 20191230

## 2019-01-02 NOTE — Progress Notes (Signed)
ILR wound check. Wound healing, bandaid placed on site today. Instructions given. monitor transmitting. All AF episodes to date previously reviewed by Dr Curt Bears and felt to be false. Device reprogrammed per Dr Curt Bears to AF detection of 20 minutes and AF detection less sensitive.

## 2019-01-04 ENCOUNTER — Encounter: Payer: Self-pay | Admitting: Family Medicine

## 2019-01-06 ENCOUNTER — Telehealth: Payer: Self-pay | Admitting: Family Medicine

## 2019-01-06 ENCOUNTER — Telehealth: Payer: Self-pay

## 2019-01-06 NOTE — Telephone Encounter (Signed)
Called and left verbal orders. 

## 2019-01-06 NOTE — Telephone Encounter (Signed)
Pam RN from [facility] is calling for verbal orders for this pt.   Skilled nursing: 2 x week for 3 weeks   Also for education on medication management.   Pam said when calling back to ask for Tonya. Best call back number is [phone].

## 2019-01-06 NOTE — Telephone Encounter (Signed)
Manual transmission received and reviewed. "AF" episode ECGs continue to show SR w/frequent ectopy. ECGs printed and placed in Dr. Kathalene Frames folder for review.

## 2019-01-06 NOTE — Telephone Encounter (Signed)
I spoke with the patient about sending a manual transmission. Pt agreed to send one today.

## 2019-01-06 NOTE — Telephone Encounter (Signed)
Will forward to MD to give verbal ok and I will call.  Jazmin Hartsell,CMA

## 2019-01-07 ENCOUNTER — Other Ambulatory Visit: Payer: Self-pay | Admitting: Cardiology

## 2019-01-08 ENCOUNTER — Ambulatory Visit: Payer: Medicare Other | Admitting: Family Medicine

## 2019-01-08 DIAGNOSIS — R232 Flushing: Secondary | ICD-10-CM

## 2019-01-08 DIAGNOSIS — M549 Dorsalgia, unspecified: Secondary | ICD-10-CM | POA: Diagnosis not present

## 2019-01-08 MED ORDER — GABAPENTIN 300 MG PO CAPS: 300.0000 mg | ORAL_CAPSULE | Freq: Every day | ORAL | 3 refills | Status: DC

## 2019-01-08 NOTE — Patient Instructions (Signed)
Thank you for coming to see me today. It was a pleasure! Today we talked about:   Hot flashes and back pain I would like to try gabapentin 300 mg nightly.  Has been shown to possibly reduce the amount of hot flashes and it is also a good medication for back pain.  Please take this before bed as it can sometimes cause drowsiness.  Please follow-up with me in 3 months or sooner as needed.  If you have any questions or concerns, please do not hesitate to call the office at [phone].  Take Care,   [name] Thamas Appleyard, DO

## 2019-01-08 NOTE — Progress Notes (Signed)
Subjective:    Patient ID: [name], female    DOB: November 05, 1951, 68 y.o.   MRN: [id number]   CC: Follow-up for back pain  HPI:  Back pain Reports that her back pain after going into the skilled nursing has slightly improved.  Patient was shown stretches and exercises in order to help with her back.  She was offered Vicodin but did not like the way that it made her feel.  Patient reports that she is still using Tylenol as needed as well as the cream.  She states that the cream sometimes works for days but then sometimes does not work at all.  She states that she does not have the pain every day but the days that she has a it limits her activities.  Pain is located in the middle of her back and does not radiate anywhere.  Patient denies any associated numbness or tingling denies any bowel or bladder incontinence.  Hot flashes Patient reports that she continues to have hot flashes daily since her stroke.  She says she has them less often than every hour but she has them pretty frequently.  Reports that everybody else in the room will be feeling comfortable and she will break out into a sweat.  She then has to turn the fan remove clothing but it is quite uncomfortable. S/p hysterectomy at 60 and is postmenopausal for many years.  Did not notice any improvement after increasing Prozac.  Soft bowel movements Patient reporting that she has had an increase in soft bowel movements after being placed on MiraLAX while admitted to rehabilitation.  Patient has decreased the amount that she is taking and believes that this will help with her bowel movements.  Smoking status reviewed  ROS: 10 point ROS is otherwise negative, except as mentioned in HPI  Patient Active Problem List   Diagnosis Date Noted  . Hot flashes 12/11/2018  . 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 Abnormal echocardiogram   . Stroke (cerebrum) ([place]) 10/13/2018  . Frequent PVCs   . Uncontrolled type 1 diabetes  mellitus ([place])   . BPPV (benign paroxysmal positional vertigo), right 06/13/2017  . Premature atrial beats 06/13/2017  . Snoring 07/31/2016  . Pre-ulcerative corn or callous 12/15/2015  . History of CHF (congestive heart failure) 10/29/2015  . Essential hypertension 10/29/2015  . Cerebrovascular accident (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 ([name]) 10/29/2015  . Thyroid activity decreased   . Intracranial vascular stenosis   . HLD (hyperlipidemia)   . Cerebral infarction due to thrombosis of basilar artery ([place])   . CVA (cerebral vascular accident) ([place]) 08/29/2015  . Posterior tibial tendinitis of right leg 04/26/2014  . Atypical chest pain 10/06/2013  . Morbid obesity ([place]) 05/29/2013  . Back pain without radiation 04/28/2013  . Nail dystrophy 01/10/2013  . Diabetes mellitus ([place]) 01/25/2012  . Hyperlipidemia 01/25/2012  . Hypothyroidism 01/25/2012  . Hypertension 01/25/2012  . GERD (gastroesophageal reflux disease) 01/25/2012     Objective:  BP 140/80   Pulse 80   Temp 97.9 F (36.6 C)   Wt 230 lb 6.4 oz (104.5 kg)   SpO2 98%   BMI 36.09 kg/m  Vitals and nursing note reviewed  General: NAD, pleasant Cardiac: RRR, normal heart sounds, no murmurs Respiratory: CTAB, normal effort Abdomen: soft, nontender, nondistended Extremities: no edema or cyanosis. WWP. Skin: warm and dry, no rashes noted Neuro: alert and oriented, no focal deficits Psych: normal affect  Assessment & Plan:  Hot flashes Again unclear etiology but could be related to stroke.  Patient not candidate for hormonal therapy.  After research of nonpharmacological management gabapentin has shown to reduce frequency of hot flashes and some studies.   -Will trial patient on gabapentin which may also benefit her back pain.  Will start at 300 mg nightly.  Back pain without radiation Patient's back pain has improved after she has started doing exercises and stretches.  Patient encouraged to continue  doing those exercises and stretches.  Patient with no red flag symptoms at this time.  Reports that she is having someone come to her home who is going to show her how to continue her rehabilitation exercises and stretches at home with her home equipment. -Counseled on appropriate amount of Tylenol that she can take per day as she was only taking 1 tablet/day.   -Also encouraged to use cream as much as often as it does relieve her pain at times. -We will trial patient on gabapentin 300 mg nightly in order to possibly benefit hot flashes as well as her back pain.  Counseled patient on appropriate use of MiraLAX and titration today.  [name] Kalev Temme, DO Family Medicine Resident PGY-2

## 2019-01-09 ENCOUNTER — Ambulatory Visit (INDEPENDENT_AMBULATORY_CARE_PROVIDER_SITE_OTHER): Payer: Medicare Other | Admitting: Nurse Practitioner

## 2019-01-09 DIAGNOSIS — I639 Cerebral infarction, unspecified: Secondary | ICD-10-CM

## 2019-01-09 NOTE — Assessment & Plan Note (Signed)
Patient's back pain has improved after she has started doing exercises and stretches.  Patient encouraged to continue doing those exercises and stretches.  Patient with no red flag symptoms at this time.  Reports that she is having someone come to her home who is going to show her how to continue her rehabilitation exercises and stretches at home with her home equipment. -Counseled on appropriate amount of Tylenol that she can take per day as she was only taking 1 tablet/day.   -Also encouraged to use cream as much as often as it does relieve her pain at times. -We will trial patient on gabapentin 300 mg nightly in order to possibly benefit hot flashes as well as her back pain.

## 2019-01-09 NOTE — Assessment & Plan Note (Signed)
Again unclear etiology but could be related to stroke.  Patient not candidate for hormonal therapy.  After research of nonpharmacological management gabapentin has shown to reduce frequency of hot flashes and some studies.   -Will trial patient on gabapentin which may also benefit her back pain.  Will start at 300 mg nightly.

## 2019-01-09 NOTE — Progress Notes (Signed)
ILR programming changed to AF "least sensitive" per tech services recommendations due to frequent false AF episodes.

## 2019-01-11 ENCOUNTER — Encounter: Payer: Self-pay | Admitting: Family Medicine

## 2019-01-14 ENCOUNTER — Telehealth: Payer: Self-pay | Admitting: *Deleted

## 2019-01-14 NOTE — Telephone Encounter (Signed)
Called and left verbal orders. 

## 2019-01-14 NOTE — Telephone Encounter (Signed)
Minna Merritts from [facility] calling for PT verbal orders as follows:  2 time(s) weekly for 4 week(s)  You can leave verbal orders on confidential voicemail.  Kyrstyn Greear, Salome Spotted, CMA

## 2019-01-15 ENCOUNTER — Ambulatory Visit: Payer: Medicare Other | Admitting: Cardiology

## 2019-01-15 ENCOUNTER — Encounter: Payer: Self-pay | Admitting: Cardiology

## 2019-01-15 VITALS — BP 144/60 | HR 61 | Ht 67.0 in | Wt 256.8 lb

## 2019-01-15 DIAGNOSIS — I639 Cerebral infarction, unspecified: Secondary | ICD-10-CM | POA: Diagnosis not present

## 2019-01-15 DIAGNOSIS — I10 Essential (primary) hypertension: Secondary | ICD-10-CM

## 2019-01-15 DIAGNOSIS — I491 Atrial premature depolarization: Secondary | ICD-10-CM

## 2019-01-15 NOTE — Progress Notes (Signed)
Cardiology Office Note:    Date:  01/15/2019   ID:  [name], DOB 1951-12-07, MRN [id number]  PCP:  Shirley, [name], DO  Cardiologist:  Candee Furbish, MD  Electrophysiologist:  None   Referring MD: Shirley, [name], DO     History of Present Illness:    [name] is a 68 y.o. female here for follow-up of implantable loop recorder placed on 12/23/2018 in the setting of cryptogenic stroke.  Her sensitivity levels have been decreased because of false positive atrial fibrillation.  She has diabetes with hypertension, hyperlipidemia, thyroid cancer status post thyroidectomy and prior stroke in 2016 as well as acute stroke on 10/13/2018.  She was found to have frequent atrial ectopy on telemetry during hospitalization but this was thought not to be atrial fibrillation.  No palpitations.  TEE previously was unremarkable.  Had some chest discomfort in the emergency department on 10/25/2018.  No atrial fibrillation.  GERD.  Normal coronary arteries in 2013.  Gabapentin hot flashes. No chest pain. Recent falls.   Denies any fevers chills nausea vomiting syncope.  Past Medical History:  Diagnosis Date  . Anemia   . Arthritis   . CHF (congestive heart failure) ([place])   . Depression   . GERD (gastroesophageal reflux disease)   . Hemorrhoids   . Hyperlipidemia   . Hypertension   . Hypothyroidism   . Seasonal allergies   . Stroke ([place])   . Thyroid cancer ([place])    per pt S/p Total Thyroidectomy with Radioactive Iodine Therapy  . Type 1 diabetes mellitus ([place])     Past Surgical History:  Procedure Laterality Date  . ABDOMINAL ANGIOGRAM N/A 05/14/2012   Procedure: ABDOMINAL ANGIOGRAM;  Surgeon: Laverda Page, MD;  Location: [facility] CATH LAB;  Service: Cardiovascular;  Laterality: N/A;  . ABDOMINAL HYSTERECTOMY  1995   partial  . ANKLE FRACTURE SURGERY Right   . CARDIAC CATHETERIZATION     with coronary angiogram  . COLONOSCOPY N/A 09/09/2014   Procedure:  COLONOSCOPY;  Surgeon: Gatha Mayer, MD;  Location: [facility];  Service: Endoscopy;  Laterality: N/A;  . ESOPHAGOGASTRODUODENOSCOPY (EGD) WITH PROPOFOL N/A 12/03/2018   Procedure: ESOPHAGOGASTRODUODENOSCOPY (EGD) WITH PROPOFOL;  Surgeon: Doran Stabler, MD;  Location: [facility];  Service: Gastroenterology;  Laterality: N/A;  . LEFT AND RIGHT HEART CATHETERIZATION WITH CORONARY ANGIOGRAM N/A 05/14/2012   Procedure: LEFT AND RIGHT HEART CATHETERIZATION WITH CORONARY ANGIOGRAM;  Surgeon: Laverda Page, MD;  Location: [facility] CATH LAB;  Service: Cardiovascular;  Laterality: N/A;  . LOOP RECORDER INSERTION N/A 12/23/2018   Procedure: LOOP RECORDER INSERTION;  Surgeon: Constance Haw, MD;  Location: [facility] CV LAB;  Service: Cardiovascular;  Laterality: N/A;  . MINI METER   09/08/2014   ELECTRONIC INSULIN PUMP  . TEE WITHOUT CARDIOVERSION N/A 10/16/2018   Procedure: TRANSESOPHAGEAL ECHOCARDIOGRAM (TEE);  Surgeon: Buford Dresser, MD;  Location: [facility] ENDOSCOPY;  Service: Cardiovascular;  Laterality: N/A;  . THYROIDECTOMY  2006    Current Medications: Current Meds  Medication Sig  . acetaminophen (TYLENOL) 500 MG tablet Take 1,000 mg by mouth as needed for mild pain.  Marland Kitchen atorvastatin (LIPITOR) 80 MG tablet TAKE 1 TABLET BY MOUTH DAILY  . clopidogrel (PLAVIX) 75 MG tablet TAKE 1 TABLET BY MOUTH EVERY DAY  . CRANBERRY EXTRACT PO Take 1 capsule by mouth daily.  . diclofenac sodium (VOLTAREN) 1 % GEL Apply 4 g topically 4 (four) times daily.  . fexofenadine (ALLEGRA) 30 MG tablet  Take 30 mg by mouth daily as needed (allergies).   Marland Kitchen FLUoxetine (PROZAC) 20 MG tablet Take 30 mg by mouth daily.  Marland Kitchen gabapentin (NEURONTIN) 300 MG capsule Take 1 capsule (300 mg total) by mouth at bedtime.  . Insulin Human (INSULIN PUMP) SOLN Inject into the skin continuous. humalog insulin  . levothyroxine (SYNTHROID, LEVOTHROID) 175 MCG tablet Take 175 mcg by mouth daily before breakfast.   . losartan  (COZAAR) 100 MG tablet TAKE 1 TABLET BY MOUTH DAILY  . metoprolol succinate (TOPROL-XL) 50 MG 24 hr tablet TAKE 1 TABLET BY MOUTH DAILY  . pantoprazole (PROTONIX) 40 MG tablet Take 1 tablet (40 mg total) by mouth daily.     Allergies:   Latex   Social History   Socioeconomic History  . Marital status: Divorced    Spouse name: Not on file  . Number of children: 1  . Years of education: Not on file  . Highest education level: Not on file  Occupational History  . Occupation: retired Tour manager  . Financial resource strain: Not on file  . Food insecurity:    Worry: Not on file    Inability: Not on file  . Transportation needs:    Medical: Not on file    Non-medical: Not on file  Tobacco Use  . Smoking status: Never Smoker  . Smokeless tobacco: Never Used  Substance and Sexual Activity  . Alcohol use: No  . Drug use: No  . Sexual activity: Not on file  Lifestyle  . Physical activity:    Days per week: Not on file    Minutes per session: Not on file  . Stress: Not on file  Relationships  . Social connections:    Talks on phone: Not on file    Gets together: Not on file    Attends religious service: Not on file    Active member of club or organization: Not on file    Attends meetings of clubs or organizations: Not on file    Relationship status: Not on file  Other Topics Concern  . Not on file  Social History Narrative   Retired Pharmacist, hospital, high school teacher taught mass. One son, helping to care for her granddaughter. No caffeine. Updated 07/20/2014.     Family History: The patient's family history includes Diabetes in her maternal aunt; Early death in her father; Heart attack in her maternal grandfather and mother; Heart disease in her maternal aunt, maternal grandfather, and mother; Heart failure in her maternal grandfather; Hyperlipidemia in her father and mother; Hypertension in her father and mother; Prostate cancer in her paternal uncle; Renal Disease in  her mother; Stroke in her son. There is no history of Breast cancer.  ROS:   Please see the history of present illness.     All other systems reviewed and are negative.  EKGs/Labs/Other Studies Reviewed:    The following studies were reviewed today:  Cardiac event monitor 10/28/2018- 1 episode of 5 beats nonsustained ventricular tachycardia no atrial fibrillation occasional PACs  TTE 10/14/18  Review of the above records today demonstrates:  - Left ventricle: The cavity size was normal. There was moderate concentric hypertrophy. Systolic function was normal. The estimated ejection fraction was in the range of 60% to 65%. Wall motion was normal; there were no regional wall motion abnormalities. The study is not technically sufficient to allow evaluation of LV diastolic function. - Left atrium: The atrium was normal in size. - Right atrium: The atrium  was normal in size.  EKG:  EKG is not ordered today.  Previously sinus with PACs  Recent Labs: 10/29/2018: BNP 30.7 11/25/2018: TSH 2.479 12/02/2018: Magnesium 1.9 12/03/2018: ALT 18; BUN 5; Creatinine, Ser 0.98; Potassium 3.9; Sodium 138 12/09/2018: Hemoglobin 10.4; Platelets 290  Recent Lipid Panel    Component Value Date/Time   CHOL 107 10/14/2018 0439   TRIG 53 10/14/2018 0439   HDL 48 10/14/2018 0439   CHOLHDL 2.2 10/14/2018 0439   VLDL 11 10/14/2018 0439   LDLCALC 48 10/14/2018 0439   LDLDIRECT 75 01/25/2012 1508    Physical Exam:    VS:  BP (!) 144/60   Pulse 61   Ht 5\' 7"  (1.702 m)   Wt 256 lb 12.8 oz (116.5 kg)   SpO2 97%   BMI 40.22 kg/m     Wt Readings from Last 3 Encounters:  01/15/19 256 lb 12.8 oz (116.5 kg)  01/08/19 230 lb 6.4 oz (104.5 kg)  12/23/18 253 lb (114.8 kg)     GEN: obese, walker,  Well nourished, well developed in no acute distress HEENT: Normal NECK: No JVD; No carotid bruits LYMPHATICS: No lymphadenopathy CARDIAC: RRR occasional ectopy, no murmurs, rubs,  gallops RESPIRATORY:  Clear to auscultation without rales, wheezing or rhonchi  ABDOMEN: Soft, non-tender, non-distended MUSCULOSKELETAL:  No edema; No deformity  SKIN: Warm and dry NEUROLOGIC:  Alert and oriented x 3 PSYCHIATRIC:  Normal affect   ASSESSMENT:    1. Cerebrovascular accident (CVA), unspecified mechanism ([place])   2. Essential hypertension   3. Premature atrial beats    PLAN:    In order of problems listed above:  Cryptogenic stroke - 2 strokes 2016 2019.  No atrial fibrillation has been detected on telemetry or heart monitoring.  Frequent atrial ectopy noted.  She underwent loop recorder placement by Dr. Curt Bears in December 2019. -CHADSVASc score is at least 5.  If atrial fibrillation is diagnosed, she will require anticoagulation. -For now continuing with both aspirin and Plavix for prevention. -Continue with physical therapy.  Hyperlipidemia - High intensity statin therapy, goal LDL less than 70  Diabetes with essential hypertension -Continue with antihypertensives.  Morbid obesity -Continue to encourage weight loss.  Secondary pulmonary hypertension - Secondary to weight.  GERD -Improved with PPI.  Main complaint is hot flashes which have occurred after her stroke.  Perhaps soymilk would help.  She does go to the bathroom 4 times a day she states however.   Medication Adjustments/Labs and Tests Ordered: Current medicines are reviewed at length with the patient today.  Concerns regarding medicines are outlined above.  No orders of the defined types were placed in this encounter.  No orders of the defined types were placed in this encounter.   Patient Instructions  Medication Instructions:  The current medical regimen is effective;  continue present plan and medications.  If you need a refill on your cardiac medications before your next appointment, please call your pharmacy.   Follow-Up: At [facility], you and your health needs are our  priority.  As part of our continuing mission to provide you with exceptional heart care, we have created designated Provider Care Teams.  These Care Teams include your primary Cardiologist (physician) and Advanced Practice Providers (APPs -  Physician Assistants and Nurse Practitioners) who all work together to provide you with the care you need, when you need it. You will need a follow up appointment in 12 months.  Please call our office 2 months in advance to  schedule this appointment.  You may see Candee Furbish, MD or one of the following Advanced Practice Providers on your designated Care Team:   Truitt Merle, NP Cecilie Kicks, NP . Kathyrn Drown, NP  Thank you for choosing [facility]!!         Signed, Candee Furbish, MD  01/15/2019 10:11 AM    [place]

## 2019-01-15 NOTE — Patient Instructions (Signed)
Medication Instructions:  The current medical regimen is effective;  continue present plan and medications.  If you need a refill on your cardiac medications before your next appointment, please call your pharmacy.   Follow-Up: At [facility], you and your health needs are our priority.  As part of our continuing mission to provide you with exceptional heart care, we have created designated Provider Care Teams.  These Care Teams include your primary Cardiologist (physician) and Advanced Practice Providers (APPs -  Physician Assistants and Nurse Practitioners) who all work together to provide you with the care you need, when you need it. You will need a follow up appointment in 12 months.  Please call our office 2 months in advance to schedule this appointment.  You may see Mark Skains, MD or one of the following Advanced Practice Providers on your designated Care Team:   Lori Gerhardt, NP Laura Ingold, NP . Jill McDaniel, NP  Thank you for choosing [facility]!!      

## 2019-01-16 ENCOUNTER — Encounter: Payer: Self-pay | Admitting: Family Medicine

## 2019-01-16 MED ORDER — LOSARTAN POTASSIUM 100 MG PO TABS: 100.0000 mg | ORAL_TABLET | Freq: Every day | ORAL | 3 refills | Status: DC

## 2019-01-16 MED ORDER — FLUOXETINE HCL 20 MG PO TABS: 30.0000 mg | ORAL_TABLET | Freq: Every day | ORAL | 2 refills | Status: DC

## 2019-01-23 ENCOUNTER — Encounter: Payer: Self-pay | Admitting: Family Medicine

## 2019-01-27 ENCOUNTER — Ambulatory Visit: Payer: Medicare Other

## 2019-01-27 ENCOUNTER — Telehealth: Payer: Self-pay

## 2019-01-27 ENCOUNTER — Other Ambulatory Visit: Payer: Self-pay | Admitting: Family Medicine

## 2019-01-27 MED ORDER — FLUOXETINE HCL 10 MG PO CAPS: 10.0000 mg | ORAL_CAPSULE | Freq: Every day | ORAL | 1 refills | Status: DC

## 2019-01-27 NOTE — Telephone Encounter (Signed)
Pt called nurse line stating she has been summoned for jury duty and she needs her PCP to write her a medical excuse. I informed patient to drop off the actual letter and will forward to pcp.

## 2019-01-28 ENCOUNTER — Encounter: Payer: Self-pay | Admitting: *Deleted

## 2019-01-28 NOTE — Telephone Encounter (Signed)
Clinical info completed on jury duty form.  Place form in Dr. Bonnita Levan box for completion.  Andreas Newport, [place]

## 2019-01-28 NOTE — Telephone Encounter (Signed)
I have started a letter in the patient's chart that can be used with her jury summons.  Tamecia Mcdougald,CMA

## 2019-01-28 NOTE — Telephone Encounter (Signed)
form dropped off for at front desk for completion.  Verified that patient section of form has been completed.  Last DOS/WCC with PCP was01/15/20 Placed form in team folder to be completed by clinical staff.  [name]

## 2019-01-29 ENCOUNTER — Encounter: Payer: Self-pay | Admitting: Family Medicine

## 2019-01-29 NOTE — Telephone Encounter (Signed)
Letter completed and placed in RN inbox.

## 2019-01-29 NOTE — Telephone Encounter (Signed)
Letter mailed to address as requested.  [place], Salome Spotted, CMA

## 2019-01-31 ENCOUNTER — Telehealth: Payer: Self-pay | Admitting: Cardiology

## 2019-01-31 NOTE — Telephone Encounter (Signed)
Spoke w/ pt and requested that she send a manual transmission today and weekly. She stated that she might forget to send a transmission weekly but she would send a transmission today.

## 2019-01-31 NOTE — Telephone Encounter (Signed)
Manual transmission received and reviewed. Plan to review with Dr. Curt Bears for recommendations. Frequent ectopy makes rhythm interpretation difficult. Some "AF" episodes clearly indicate SR w/PACs.

## 2019-02-02 LAB — CUP PACEART REMOTE DEVICE CHECK
Implantable Pulse Generator Implant Date: 20191230
MDC IDC SESS DTM: 20200203050500

## 2019-02-03 ENCOUNTER — Encounter: Payer: Self-pay | Admitting: Adult Health

## 2019-02-03 NOTE — Telephone Encounter (Signed)
Manual transmission received. Will review episodes with MD for recommendations.

## 2019-02-03 NOTE — Telephone Encounter (Signed)
LMOVM requesting that pt send a manual transmission w/ her home monitor.  

## 2019-02-04 ENCOUNTER — Encounter: Payer: Self-pay | Admitting: Adult Health

## 2019-02-04 ENCOUNTER — Encounter: Payer: Self-pay | Admitting: Family Medicine

## 2019-02-07 ENCOUNTER — Other Ambulatory Visit: Payer: Self-pay | Admitting: Family Medicine

## 2019-02-07 ENCOUNTER — Encounter: Payer: Self-pay | Admitting: Family Medicine

## 2019-02-07 DIAGNOSIS — I639 Cerebral infarction, unspecified: Secondary | ICD-10-CM

## 2019-02-07 NOTE — Progress Notes (Signed)
Patient with remote h/o CVA with 2 recent falls, requesting further work with PT. Will place referral.

## 2019-02-07 NOTE — Telephone Encounter (Signed)
Dr. Curt Bears reviewed "AF" ECGs--ECGs continue to show SR w/PACs and PVCs. Continue to monitor for true AF. Continue weekly manual transmsisions.

## 2019-02-08 ENCOUNTER — Encounter: Payer: Self-pay | Admitting: Family Medicine

## 2019-02-08 ENCOUNTER — Other Ambulatory Visit: Payer: Self-pay | Admitting: Family Medicine

## 2019-02-11 ENCOUNTER — Telehealth: Payer: Self-pay

## 2019-02-11 ENCOUNTER — Ambulatory Visit: Payer: Medicare Other | Attending: Adult Health | Admitting: Physical Therapy

## 2019-02-11 ENCOUNTER — Other Ambulatory Visit: Payer: Self-pay

## 2019-02-11 ENCOUNTER — Other Ambulatory Visit: Payer: Self-pay | Admitting: Cardiology

## 2019-02-11 ENCOUNTER — Encounter: Payer: Self-pay | Admitting: Physical Therapy

## 2019-02-11 DIAGNOSIS — Z9181 History of falling: Secondary | ICD-10-CM | POA: Diagnosis present

## 2019-02-11 DIAGNOSIS — R2681 Unsteadiness on feet: Secondary | ICD-10-CM

## 2019-02-11 DIAGNOSIS — R2689 Other abnormalities of gait and mobility: Secondary | ICD-10-CM | POA: Diagnosis present

## 2019-02-11 DIAGNOSIS — M6281 Muscle weakness (generalized): Secondary | ICD-10-CM | POA: Insufficient documentation

## 2019-02-11 DIAGNOSIS — R293 Abnormal posture: Secondary | ICD-10-CM | POA: Insufficient documentation

## 2019-02-11 DIAGNOSIS — R42 Dizziness and giddiness: Secondary | ICD-10-CM | POA: Diagnosis present

## 2019-02-11 NOTE — Telephone Encounter (Signed)
Transmission received ECGs appear to show SR w/ PACs and PVCs will review with WC once back in the office.

## 2019-02-11 NOTE — Telephone Encounter (Signed)
I left a message on pt voicemail to send a manual transmission with her home monitor.

## 2019-02-12 NOTE — Therapy (Signed)
[facility] [address] [place], [place], [address] Phone: [phone]   Fax:  [phone]  Physical Therapy Evaluation  Patient Details  Name: [name] MRN: [id number] Date of Birth: [birth year] Referring Provider (PT): Andrena Mews, MD   Encounter Date: 02/11/2019  PT End of Session - 02/11/19 1355    Visit Number  1    Number of Visits  17    Authorization Type  UHC Medicare    PT Start Time  2951    PT Stop Time  1100    PT Time Calculation (min)  45 min    Equipment Utilized During Treatment  Gait belt    Activity Tolerance  Patient tolerated treatment well;Patient limited by fatigue    Behavior During Therapy  [facility] for tasks assessed/performed       Past Medical History:  Diagnosis Date  . Anemia   . Arthritis   . CHF (congestive heart failure) ([place])   . Depression   . GERD (gastroesophageal reflux disease)   . Hemorrhoids   . Hyperlipidemia   . Hypertension   . Hypothyroidism   . Seasonal allergies   . Stroke ([place])   . Thyroid cancer ([place])    per pt S/p Total Thyroidectomy with Radioactive Iodine Therapy  . Type 1 diabetes mellitus ([place])     Past Surgical History:  Procedure Laterality Date  . ABDOMINAL ANGIOGRAM N/A 05/14/2012   Procedure: ABDOMINAL ANGIOGRAM;  Surgeon: Laverda Page, MD;  Location: [facility] CATH LAB;  Service: Cardiovascular;  Laterality: N/A;  . ABDOMINAL HYSTERECTOMY  1995   partial  . ANKLE FRACTURE SURGERY Right   . CARDIAC CATHETERIZATION     with coronary angiogram  . COLONOSCOPY N/A 09/09/2014   Procedure: COLONOSCOPY;  Surgeon: Gatha Mayer, MD;  Location: [facility];  Service: Endoscopy;  Laterality: N/A;  . ESOPHAGOGASTRODUODENOSCOPY (EGD) WITH PROPOFOL N/A 12/03/2018   Procedure: ESOPHAGOGASTRODUODENOSCOPY (EGD) WITH PROPOFOL;  Surgeon: Doran Stabler, MD;  Location: [facility];  Service: Gastroenterology;  Laterality: N/A;  . LEFT AND  RIGHT HEART CATHETERIZATION WITH CORONARY ANGIOGRAM N/A 05/14/2012   Procedure: LEFT AND RIGHT HEART CATHETERIZATION WITH CORONARY ANGIOGRAM;  Surgeon: Laverda Page, MD;  Location: [facility] CATH LAB;  Service: Cardiovascular;  Laterality: N/A;  . LOOP RECORDER INSERTION N/A 12/23/2018   Procedure: LOOP RECORDER INSERTION;  Surgeon: Constance Haw, MD;  Location: [facility] CV LAB;  Service: Cardiovascular;  Laterality: N/A;  . MINI METER   09/08/2014   ELECTRONIC INSULIN PUMP  . TEE WITHOUT CARDIOVERSION N/A 10/16/2018   Procedure: TRANSESOPHAGEAL ECHOCARDIOGRAM (TEE);  Surgeon: Buford Dresser, MD;  Location: [facility] ENDOSCOPY;  Service: Cardiovascular;  Laterality: N/A;  . THYROIDECTOMY  2006    There were no vitals filed for this visit.   Subjective Assessment - 02/11/19 1015    Subjective  This 68yo female was referred on 02/07/2019 by Andrena Mews, MD with CVA. She was hospitalized 10/13/2018 - 10/16/2018 with left sided numbness with acute subcentimeter Right pontine infarct.     Pertinent History  CVA 2016, HTN, thyroid CA, arthritis, CHF, depression, DM1    Limitations  Lifting;Standing;Walking;House hold activities    Patient Stated Goals  To improve left side strength, walking & balance    Currently in Pain?  No/denies   arthritic pain in knees & back limit her sometiimes        [facility] PT Assessment - 02/11/19 1015      Assessment  Medical Diagnosis  CVA    Referring Provider (PT)  Andrena Mews, MD    Onset Date/Surgical Date  02/07/19   MD referral to PT   Hand Dominance  Right    Prior [facility] discharged January mid      Precautions   Precautions  Fall      Balance Screen   Has the patient fallen in the past 6 months  Yes    How many times?  2   no injuries, tripped moving too fast   Has the patient had a decrease in activity level because of a fear of falling?   Yes    Is the patient reluctant to leave their home because of a  fear of falling?   No      Home Environment   Living Environment  Private residence    Living Arrangements  Other relatives   10yo granddaughter & sister   Type of [place] entrance    [place]  Two level;Able to live on main level with bedroom/bathroom   sister & granddaughter   Alternate Level Stairs-Number of Steps  14    Alternate Level Stairs-Rails  Left    Home Equipment  Walker - 4 wheels;Bedside commode;Shower seat;Hand held shower head      Prior Function   Level of Independence  Independent with community mobility with device   has used rollator since CVA 2016   Vocation  Retired    [employer]  retired Printmaker math    Leisure  tutoring,       Posture/Postural Control   Posture/Postural Control  Postural limitations    Postural Limitations  Rounded Shoulders;Forward head;Flexed trunk;Weight shift right   wide stance     ROM / Strength   AROM / PROM / Strength  AROM;Strength      AROM   Overall AROM   Within functional limits for tasks performed      Strength   Overall Strength  Deficits    Overall Strength Comments  gross testing RLE 5/5, LLE 4/5      Transfers   Transfers  Sit to Stand;Stand to Sit    Sit to Stand  5: Supervision;With upper extremity assist;With armrests;From chair/3-in-1    Stand to Sit  5: Supervision;With upper extremity assist;With armrests;To chair/3-in-1      Ambulation/Gait   Ambulation/Gait  Yes    Ambulation/Gait Assistance  5: Supervision    Ambulation Distance (Feet)  100 Feet    Assistive device  Rollator    Gait Pattern  Step-through pattern;Decreased stance time - left;Decreased step length - right;Decreased weight shift to left;Antalgic;Trunk flexed;Wide base of support    Ambulation Surface  Indoor;Level    Gait velocity  2.27 ft/sec      Standardized Balance Assessment   Standardized Balance Assessment  Berg Balance Test;Timed Up and Go Test      Berg Balance Test   Sit to Stand   Able to stand  independently using hands    Standing Unsupported  Able to stand 2 minutes with supervision    Sitting with Back Unsupported but Feet Supported on Floor or Stool  Able to sit safely and securely 2 minutes    Stand to Sit  Controls descent by using hands    Transfers  Able to transfer safely, definite need of hands    Standing Unsupported with Eyes Closed  Able to stand  3 seconds    Standing Ubsupported with Feet Together  Needs help to attain position but able to stand for 30 seconds with feet together    From Standing, Reach Forward with Outstretched Arm  Can reach forward >5 cm safely (2")    From Standing Position, Pick up Object from Floor  Able to pick up shoe, needs supervision    From Standing Position, Turn to Look Behind Over each Shoulder  Turn sideways only but maintains balance    Turn 360 Degrees  Needs close supervision or verbal cueing    Standing Unsupported, Alternately Place Feet on Step/Stool  Needs assistance to keep from falling or unable to try    Standing Unsupported, One Foot in [place] to take small step independently and hold 30 seconds    Standing on One Leg  Tries to lift leg/unable to hold 3 seconds but remains standing independently    Total Score  30      Timed Up and Go Test   Normal TUG (seconds)  21.37   rollator   Cognitive TUG (seconds)  20.27   rollator               Objective measurements completed on examination: See above findings.                PT Short Term Goals - 02/11/19 1800      PT SHORT TERM GOAL #1   Title  Patient verbalizes & demonstrates understanding of initial HEP. (All STGs Target Date: 03/14/2019)    Time  4    Period  Weeks    Status  New    Target Date  03/14/19      PT SHORT TERM GOAL #2   Title  Patient verbalizes understanding of initial community based fitness activities like [employer].     Time  4    Period  Weeks    Status  New    Target Date  03/14/19      PT SHORT TERM GOAL  #3   Title  Berg Balance >/= 36/56    Time  4    Period  Weeks    Status  New    Target Date  03/14/19      PT SHORT TERM GOAL #4   Title  Patient ambulates 250' with rollator walker with supervision.     Time  4    Period  Weeks    Status  New    Target Date  03/14/19        PT Long Term Goals - 02/11/19 1800      PT LONG TERM GOAL #1   Title  Patient verbalizes & demonstrates understanding of HEP & Ongoing fitness plan. (All LTGs Target Date: 04/11/2019)    Time  8    Period  Weeks    Status  New    Target Date  04/11/19      PT LONG TERM GOAL #2   Title  Berg Balance >/= 40/56 to indicate lower fall risk.     Time  8    Period  Weeks    Status  New    Target Date  04/11/19      PT LONG TERM GOAL #3   Title  Timed Up & Go <13.5sec with rollator walker to indicate lower fall risk.     Time  8    Period  Weeks    Status  New  Target Date  04/11/19      PT LONG TERM GOAL #4   Title  Patient ambulates 400' with rollator walker modified independent.    Time  8    Period  Weeks    Status  New    Target Date  04/11/19      PT LONG TERM GOAL #5   Title  Patient negotiates ramps & curbs with rollator walker and stairs with single rail similar to home modified independent.     Time  8    Period  Weeks    Status  New    Target Date  04/11/19             Plan - 02/11/19 1800    Clinical Impression Statement  This 68yo female sustained her first CVA 2016 and began using a rollator walker. She sustained a second CVA 10/13/2018 with left side weakness. She went to rehab nursing home and was discharged in mid-January. She has ongoing left side weakness & deconditioning weakness with impaired endurance. She has arthritic type pain also limiting mobility. Berg Balance 30/56 which indicates high fall risk and dependency in standing ADLs. Timed Up & Go 21.37sec & Cognitive TUG 20.27sec with rollator walker also indicates high fall risk. Patient has gait deviations  indicating fall risk. Patient would benefit from skilled PT to improve function & safety.      History and Personal Factors relevant to plan of care:  CVA 2016 & 10/13/2018, HTN, thyroid CA, arthritis, CHF, depression, DM1    Clinical Presentation  Evolving    Clinical Presentation due to:  high fall risk, 2nd CVA, type 1 DM with high A1C, hx of cardiac issues,     Clinical Decision Making  Moderate    Rehab Potential  Good    PT Frequency  2x / week    PT Duration  8 weeks    PT Treatment/Interventions  ADLs/Self Care Home Management;Cryotherapy;Moist Heat;Ultrasound;DME Instruction;Gait training;Stair training;Functional mobility training;Therapeutic activities;Therapeutic exercise;Balance training;Neuromuscular re-education;Patient/family education;Dry needling;Vestibular    PT Next Visit Plan  set up HEP for strength, flexibility & balance, start instructing exercises at [facility], info on [place] balance classes    Consulted and Agree with Plan of Care  Patient       Patient will benefit from skilled therapeutic intervention in order to improve the following deficits and impairments:  Abnormal gait, Decreased activity tolerance, Decreased balance, Decreased coordination, Decreased endurance, Decreased mobility, Decreased strength, Dizziness, Impaired flexibility, Postural dysfunction, Obesity, Pain  Visit Diagnosis: Unsteadiness on feet  Other abnormalities of gait and mobility  Muscle weakness (generalized)  History of falling  Dizziness and giddiness  Abnormal posture     Problem List Patient Active Problem List   Diagnosis Date Noted  . Hot flashes 12/11/2018  . 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 Abnormal echocardiogram   . Stroke (cerebrum) ([place]) 10/13/2018  . Frequent PVCs   . Uncontrolled type 1 diabetes mellitus ([place])   . BPPV (benign paroxysmal positional vertigo), right 06/13/2017  . Premature atrial beats 06/13/2017  .  Snoring 07/31/2016  . Pre-ulcerative corn or callous 12/15/2015  . History of CHF (congestive heart failure) 10/29/2015  . Essential hypertension 10/29/2015  . Cerebrovascular accident (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 ([place]) 10/29/2015  . Thyroid activity decreased   . Intracranial vascular stenosis   . HLD (hyperlipidemia)   . 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  CVA (cerebral vascular accident) ([place]) 08/29/2015  . Posterior tibial tendinitis of right leg 04/26/2014  . Atypical chest pain 10/06/2013  . Morbid obesity ([place]) 05/29/2013  . Back pain without radiation 04/28/2013  . Nail dystrophy 01/10/2013  . Diabetes mellitus ([place]) 01/25/2012  . Hyperlipidemia 01/25/2012  . Hypothyroidism 01/25/2012  . Hypertension 01/25/2012  . GERD (gastroesophageal reflux disease) 01/25/2012    Jamey Reas PT, DPT 02/12/2019, 6:50 AM  [facility] [address] [place], [place], [address] Phone: [phone]   Fax:  [phone]  Name: [name] MRN: [id number] Date of Birth: [birth year]

## 2019-02-13 ENCOUNTER — Encounter: Payer: Self-pay | Admitting: Family Medicine

## 2019-02-17 ENCOUNTER — Other Ambulatory Visit: Payer: Self-pay | Admitting: Family Medicine

## 2019-02-17 ENCOUNTER — Ambulatory Visit: Payer: Medicare Other | Admitting: Physical Therapy

## 2019-02-17 ENCOUNTER — Encounter: Payer: Self-pay | Admitting: Physical Therapy

## 2019-02-17 DIAGNOSIS — Z9181 History of falling: Secondary | ICD-10-CM

## 2019-02-17 DIAGNOSIS — R2681 Unsteadiness on feet: Secondary | ICD-10-CM | POA: Diagnosis not present

## 2019-02-17 DIAGNOSIS — R293 Abnormal posture: Secondary | ICD-10-CM

## 2019-02-17 DIAGNOSIS — R42 Dizziness and giddiness: Secondary | ICD-10-CM

## 2019-02-17 DIAGNOSIS — R2689 Other abnormalities of gait and mobility: Secondary | ICD-10-CM

## 2019-02-17 DIAGNOSIS — M6281 Muscle weakness (generalized): Secondary | ICD-10-CM

## 2019-02-17 NOTE — Therapy (Signed)
[facility] [address] [place], [place], [address] Phone: [phone]   Fax:  [phone]  Physical Therapy Treatment  Patient Details  Name: [name] MRN: [id number] Date of Birth: 04/20/1951 Referring Provider (PT): Andrena Mews, MD   Encounter Date: 02/17/2019  PT End of Session - 02/17/19 1102    Visit Number  2    Number of Visits  17    Authorization Type  UHC Medicare    PT Start Time  0930    PT Stop Time  1015    PT Time Calculation (min)  45 min    Equipment Utilized During Treatment  Gait belt    Activity Tolerance  Patient tolerated treatment well;Patient limited by fatigue    Behavior During Therapy  [facility] for tasks assessed/performed       Past Medical History:  Diagnosis Date  . Anemia   . Arthritis   . CHF (congestive heart failure) ([place])   . Depression   . GERD (gastroesophageal reflux disease)   . Hemorrhoids   . Hyperlipidemia   . Hypertension   . Hypothyroidism   . Seasonal allergies   . Stroke ([place])   . Thyroid cancer ([place])    per pt S/p Total Thyroidectomy with Radioactive Iodine Therapy  . Type 1 diabetes mellitus ([place])     Past Surgical History:  Procedure Laterality Date  . ABDOMINAL ANGIOGRAM N/A 05/14/2012   Procedure: ABDOMINAL ANGIOGRAM;  Surgeon: Laverda Page, MD;  Location: [facility] CATH LAB;  Service: Cardiovascular;  Laterality: N/A;  . ABDOMINAL HYSTERECTOMY  1995   partial  . ANKLE FRACTURE SURGERY Right   . CARDIAC CATHETERIZATION     with coronary angiogram  . COLONOSCOPY N/A 09/09/2014   Procedure: COLONOSCOPY;  Surgeon: Gatha Mayer, MD;  Location: [facility];  Service: Endoscopy;  Laterality: N/A;  . ESOPHAGOGASTRODUODENOSCOPY (EGD) WITH PROPOFOL N/A 12/03/2018   Procedure: ESOPHAGOGASTRODUODENOSCOPY (EGD) WITH PROPOFOL;  Surgeon: Doran Stabler, MD;  Location: [facility];  Service: Gastroenterology;  Laterality: N/A;  . LEFT AND  RIGHT HEART CATHETERIZATION WITH CORONARY ANGIOGRAM N/A 05/14/2012   Procedure: LEFT AND RIGHT HEART CATHETERIZATION WITH CORONARY ANGIOGRAM;  Surgeon: Laverda Page, MD;  Location: [facility] CATH LAB;  Service: Cardiovascular;  Laterality: N/A;  . LOOP RECORDER INSERTION N/A 12/23/2018   Procedure: LOOP RECORDER INSERTION;  Surgeon: Constance Haw, MD;  Location: [facility] CV LAB;  Service: Cardiovascular;  Laterality: N/A;  . MINI METER   09/08/2014   ELECTRONIC INSULIN PUMP  . TEE WITHOUT CARDIOVERSION N/A 10/16/2018   Procedure: TRANSESOPHAGEAL ECHOCARDIOGRAM (TEE);  Surgeon: Buford Dresser, MD;  Location: [facility] ENDOSCOPY;  Service: Cardiovascular;  Laterality: N/A;  . THYROIDECTOMY  2006    There were no vitals filed for this visit.  Subjective Assessment - 02/17/19 0930    Subjective  She fell yesterday. The walker was a step away. She fell backwards. Her right buttocks hurt yesterday. It feels okay today.     Pertinent History  CVA 2016, HTN, thyroid CA, arthritis, CHF, depression, DM1    Limitations  Lifting;Standing;Walking;House hold activities    Patient Stated Goals  To improve left side strength, walking & balance    Currently in Pain?  Yes    Pain Score  2     Pain Location  Buttocks    Pain Orientation  Right    Pain Descriptors / Indicators  Sore    Pain Onset  Yesterday  Aggravating Factors   recent fall    Pain Relieving Factors  tylenol      PT demo, instructed patient in safety with rollator walker use including sit to/from stand with brakes & position. Fall yesterday sounds like related to unsafe rollator use. Pt return demo & verbalized understanding during PT session.  Therapeutic Exercise: [employer] recumbent stepper level 1 with BUEs & BLEs 4 min work 6 min rest 2 sets. PT recommended 3 sets at [facility]. PT instructed in rationale and wrote directions to work /rest 3 sets for [employer]. Pt verbalized understanding.  PT initiated HEP with supine bridging, hooklying  clam shell abduction with red theraband, squat with rollator support and seated hamstring stretch.                          PT Education - 02/17/19 1231    Education Details  use of recumbent stepper & initial HEP.     Person(s) Educated  Patient    Methods  Explanation;Demonstration;Tactile cues;Verbal cues;Handout    Comprehension  Verbalized understanding;Returned demonstration;Verbal cues required;Tactile cues required;Need further instruction       PT Short Term Goals - 02/11/19 1800      PT SHORT TERM GOAL #1   Title  Patient verbalizes & demonstrates understanding of initial HEP. (All STGs Target Date: 03/14/2019)    Time  4    Period  Weeks    Status  New    Target Date  03/14/19      PT SHORT TERM GOAL #2   Title  Patient verbalizes understanding of initial community based fitness activities like [employer].     Time  4    Period  Weeks    Status  New    Target Date  03/14/19      PT SHORT TERM GOAL #3   Title  Berg Balance >/= 36/56    Time  4    Period  Weeks    Status  New    Target Date  03/14/19      PT SHORT TERM GOAL #4   Title  Patient ambulates 250' with rollator walker with supervision.     Time  4    Period  Weeks    Status  New    Target Date  03/14/19        PT Long Term Goals - 02/11/19 1800      PT LONG TERM GOAL #1   Title  Patient verbalizes & demonstrates understanding of HEP & Ongoing fitness plan. (All LTGs Target Date: 04/11/2019)    Time  8    Period  Weeks    Status  New    Target Date  04/11/19      PT LONG TERM GOAL #2   Title  Berg Balance >/= 40/56 to indicate lower fall risk.     Time  8    Period  Weeks    Status  New    Target Date  04/11/19      PT LONG TERM GOAL #3   Title  Timed Up & Go <13.5sec with rollator walker to indicate lower fall risk.     Time  8    Period  Weeks    Status  New    Target Date  04/11/19      PT LONG TERM GOAL #4   Title  Patient ambulates 400' with rollator walker  modified independent.    Time  8    Period  Weeks    Status  New    Target Date  04/11/19      PT LONG TERM GOAL #5   Title  Patient negotiates ramps & curbs with rollator walker and stairs with single rail similar to home modified independent.     Time  8    Period  Weeks    Status  New    Target Date  04/11/19            Plan - 02/17/19 1232    Clinical Impression Statement  Today's session focused on educating patient on rollator walker safety / use and introduce HEP. She is calling to make sure her [employer] is active.     Rehab Potential  Good    PT Frequency  2x / week    PT Duration  8 weeks    PT Treatment/Interventions  ADLs/Self Care Home Management;Cryotherapy;Moist Heat;Ultrasound;DME Instruction;Gait training;Stair training;Functional mobility training;Therapeutic activities;Therapeutic exercise;Balance training;Neuromuscular re-education;Patient/family education;Dry needling;Vestibular    PT Next Visit Plan  review initial HEP & add exercises for strength, flexibility & balance, check on [employer], give info on [place] balance classes    Consulted and Agree with Plan of Care  Patient       Patient will benefit from skilled therapeutic intervention in order to improve the following deficits and impairments:  Abnormal gait, Decreased activity tolerance, Decreased balance, Decreased coordination, Decreased endurance, Decreased mobility, Decreased strength, Dizziness, Impaired flexibility, Postural dysfunction, Obesity, Pain  Visit Diagnosis: Unsteadiness on feet  Other abnormalities of gait and mobility  Muscle weakness (generalized)  History of falling  Dizziness and giddiness  Abnormal posture     Problem List Patient Active Problem List   Diagnosis Date Noted  . Hot flashes 12/11/2018  . 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 Abnormal echocardiogram   . Stroke (cerebrum) ([place]) 10/13/2018  .  Frequent PVCs   . Uncontrolled type 1 diabetes mellitus ([place])   . BPPV (benign paroxysmal positional vertigo), right 06/13/2017  . Premature atrial beats 06/13/2017  . Snoring 07/31/2016  . Pre-ulcerative corn or callous 12/15/2015  . History of CHF (congestive heart failure) 10/29/2015  . Essential hypertension 10/29/2015  . Cerebrovascular accident (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 ([place]) 10/29/2015  . Thyroid activity decreased   . Intracranial vascular stenosis   . HLD (hyperlipidemia)   . 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 CVA (cerebral vascular accident) ([place]) 08/29/2015  . Posterior tibial tendinitis of right leg 04/26/2014  . Atypical chest pain 10/06/2013  . Morbid obesity ([place]) 05/29/2013  . Back pain without radiation 04/28/2013  . Nail dystrophy 01/10/2013  . Diabetes mellitus ([place]) 01/25/2012  . Hyperlipidemia 01/25/2012  . Hypothyroidism 01/25/2012  . Hypertension 01/25/2012  . GERD (gastroesophageal reflux disease) 01/25/2012    Jamey Reas  PT, DPT 02/17/2019, 12:34 PM  [facility] [address] [place], [place], [address] Phone: [phone]   Fax:  [phone]  Name: Verdelle Valtierra MRN: [id number] Date of Birth: 06-Oct-1951

## 2019-02-17 NOTE — Patient Instructions (Signed)
Access Code: [email or web address]  URL: [email or web address]  Date: 02/17/2019  Prepared by: Jamey Reas   Exercises  Supine Bridge - 10 reps - 1 sets - 5 seconds hold - 1x daily - 5x weekly  Hooklying Clamshell with Resistance - 10 reps - 1 sets - 5 seconds hold - 1x daily - 5x weekly  Mini Squats with Walker and Chair - 10 reps - 1 sets - 5 seconds hold - 1x daily - 5x weekly  Seated Hamstring Stretch - 2-3 reps - 1 sets - 15 seconds hold - 1x daily - 7x weekly

## 2019-02-18 ENCOUNTER — Other Ambulatory Visit: Payer: Self-pay | Admitting: Family Medicine

## 2019-02-18 MED ORDER — ATORVASTATIN CALCIUM 80 MG PO TABS: 80.0000 mg | ORAL_TABLET | Freq: Every day | ORAL | 2 refills | Status: DC

## 2019-02-18 NOTE — Telephone Encounter (Signed)
Reviewed ECGs with Dr. Curt Bears. Per Dr. Curt Bears, episode from 02/09/19 at 13:44 indicates true A-fib, duration 25min. Plan for [facility] f/u with [place], [place], on 02/19/19 at 11:00am to discuss [place].   Patient verbalizes understanding of information and [facility] parking instructions. She denies additional questions or concerns at this time and thanked me for my call.

## 2019-02-19 ENCOUNTER — Ambulatory Visit (HOSPITAL_COMMUNITY)
Admission: RE | Admit: 2019-02-19 | Discharge: 2019-02-19 | Disposition: A | Discharge status: Home / Self Care | Payer: Medicare Other | Source: Ambulatory Visit | Attending: Physician Assistant | Admitting: Physician Assistant

## 2019-02-19 ENCOUNTER — Encounter (HOSPITAL_COMMUNITY): Payer: Self-pay | Admitting: Physician Assistant

## 2019-02-19 ENCOUNTER — Ambulatory Visit: Payer: Medicare Other | Admitting: Adult Health

## 2019-02-19 ENCOUNTER — Encounter: Payer: Self-pay | Admitting: Adult Health

## 2019-02-19 ENCOUNTER — Encounter: Payer: Self-pay | Admitting: Family Medicine

## 2019-02-19 VITALS — BP 148/90 | HR 94 | Ht 67.0 in | Wt 263.6 lb

## 2019-02-19 VITALS — BP 166/70 | HR 69 | Ht 67.0 in | Wt 263.0 lb

## 2019-02-19 DIAGNOSIS — I69354 Hemiplegia and hemiparesis following cerebral infarction affecting left non-dominant side: Secondary | ICD-10-CM

## 2019-02-19 DIAGNOSIS — K219 Gastro-esophageal reflux disease without esophagitis: Secondary | ICD-10-CM | POA: Diagnosis not present

## 2019-02-19 DIAGNOSIS — D649 Anemia, unspecified: Secondary | ICD-10-CM | POA: Insufficient documentation

## 2019-02-19 DIAGNOSIS — Z79899 Other long term (current) drug therapy: Secondary | ICD-10-CM | POA: Insufficient documentation

## 2019-02-19 DIAGNOSIS — Z794 Long term (current) use of insulin: Secondary | ICD-10-CM | POA: Diagnosis not present

## 2019-02-19 DIAGNOSIS — Z7901 Long term (current) use of anticoagulants: Secondary | ICD-10-CM | POA: Insufficient documentation

## 2019-02-19 DIAGNOSIS — R0683 Snoring: Secondary | ICD-10-CM | POA: Diagnosis not present

## 2019-02-19 DIAGNOSIS — I480 Paroxysmal atrial fibrillation: Secondary | ICD-10-CM | POA: Diagnosis not present

## 2019-02-19 DIAGNOSIS — E785 Hyperlipidemia, unspecified: Secondary | ICD-10-CM | POA: Insufficient documentation

## 2019-02-19 DIAGNOSIS — F329 Major depressive disorder, single episode, unspecified: Secondary | ICD-10-CM | POA: Insufficient documentation

## 2019-02-19 DIAGNOSIS — E669 Obesity, unspecified: Secondary | ICD-10-CM | POA: Diagnosis not present

## 2019-02-19 DIAGNOSIS — I10 Essential (primary) hypertension: Secondary | ICD-10-CM | POA: Insufficient documentation

## 2019-02-19 DIAGNOSIS — Z8673 Personal history of transient ischemic attack (TIA), and cerebral infarction without residual deficits: Secondary | ICD-10-CM | POA: Insufficient documentation

## 2019-02-19 DIAGNOSIS — E108 Type 1 diabetes mellitus with unspecified complications: Secondary | ICD-10-CM | POA: Insufficient documentation

## 2019-02-19 DIAGNOSIS — E039 Hypothyroidism, unspecified: Secondary | ICD-10-CM | POA: Insufficient documentation

## 2019-02-19 DIAGNOSIS — I63511 Cerebral infarction due to unspecified occlusion or stenosis of right middle cerebral artery: Secondary | ICD-10-CM

## 2019-02-19 DIAGNOSIS — Z6841 Body Mass Index (BMI) 40.0 and over, adult: Secondary | ICD-10-CM | POA: Diagnosis not present

## 2019-02-19 DIAGNOSIS — M1990 Unspecified osteoarthritis, unspecified site: Secondary | ICD-10-CM | POA: Insufficient documentation

## 2019-02-19 DIAGNOSIS — E1159 Type 2 diabetes mellitus with other circulatory complications: Secondary | ICD-10-CM

## 2019-02-19 LAB — CBC
HCT: 32.2 % — ABNORMAL LOW (ref 36.0–46.0)
HEMOGLOBIN: 10 g/dL — AB (ref 12.0–15.0)
MCH: 27.8 pg (ref 26.0–34.0)
MCHC: 31.1 g/dL (ref 30.0–36.0)
MCV: 89.4 fL (ref 80.0–100.0)
Platelets: 219 10*3/uL (ref 150–400)
RBC: 3.6 MIL/uL — ABNORMAL LOW (ref 3.87–5.11)
RDW: 13.7 % (ref 11.5–15.5)
WBC: 5.5 10*3/uL (ref 4.0–10.5)
nRBC: 0 % (ref 0.0–0.2)

## 2019-02-19 LAB — BASIC METABOLIC PANEL
Anion gap: 9 (ref 5–15)
BUN: 13 mg/dL (ref 8–23)
CHLORIDE: 108 mmol/L (ref 98–111)
CO2: 22 mmol/L (ref 22–32)
Calcium: 8.6 mg/dL — ABNORMAL LOW (ref 8.9–10.3)
Creatinine, Ser: 0.85 mg/dL (ref 0.44–1.00)
GFR calc Af Amer: >60 mL/min (ref >60)
GFR calc non Af Amer: >60 mL/min (ref >60)
Glucose, Bld: 307 mg/dL — ABNORMAL HIGH (ref 70–99)
Potassium: 3.6 mmol/L (ref 3.5–5.1)
Sodium: 139 mmol/L (ref 135–145)

## 2019-02-19 MED ORDER — METOPROLOL SUCCINATE ER 50 MG PO TB24: ORAL_TABLET | ORAL | 2 refills | Status: DC

## 2019-02-19 MED ORDER — APIXABAN 5 MG PO TABS: 5.0000 mg | ORAL_TABLET | Freq: Two times a day (BID) | ORAL | 6 refills | Status: DC

## 2019-02-19 MED ORDER — APIXABAN 5 MG PO TABS: 5.0000 mg | ORAL_TABLET | Freq: Two times a day (BID) | ORAL | 0 refills | Status: DC

## 2019-02-19 NOTE — Progress Notes (Signed)
Primary Care Physician: Shirley, [name], DO Primary Cardiologist: Dr Marlou Porch Primary Electrophysiologist: Dr Curt Bears Referring Physician: Dr Marta Lamas [name] is a 68 y.o. female with a history of HTN, type I DM, HLD, thyroid cancer s/p thyroidectomy, CVA, GERD, and paroxysmal/ persistent atrial fibrillation who presents for consultation in the [facility]. Patient diagnosed with CVA on 10/13/18 and was seen to have frequent atrial ectopy on telemetry. ILR placed by Dr Curt Bears. The patient was initially diagnosed with atrial fibrillation 02/09/19 on ILR, confirmed by Dr Curt Bears. The episode lasted for about 30 minutes. She was asymptomatic during the episode. She denies alcohol use. She does admit to snoring and daytime somnolence.   Today, she denies symptoms of palpitations, chest pain, shortness of breath, orthopnea, PND, lower extremity edema, dizziness, presyncope, syncope, bleeding, or neurologic sequela. The patient is tolerating medications without difficulties and is otherwise without complaint today.    Atrial Fibrillation Risk Factors:  she does have symptoms or diagnosis of sleep apnea. Sleep study ordered per neurology.  she does not have a history of rheumatic fever. she does not have a history of alcohol use. The patient does not have a history of early familial atrial fibrillation or other arrhythmias.  she has a BMI of Body mass index is 41.19 kg/m.Marland Kitchen Filed Weights   02/19/19 1059  Weight: 119.3 kg    Family History  Problem Relation Age of Onset  . Heart disease Mother   . Hyperlipidemia Mother   . Hypertension Mother   . Renal Disease Mother        insufficiency  . Heart attack Mother        in his 53's  . Early death Father        Head Injury at Work  . Hyperlipidemia Father   . Hypertension Father   . Stroke Son   . Diabetes Maternal Aunt   . Prostate cancer Paternal Uncle   . Heart disease Maternal Aunt     . Heart failure Maternal Grandfather   . Heart disease Maternal Grandfather   . Heart attack Maternal Grandfather        Died in his 66's  . Breast cancer Neg Hx      Atrial Fibrillation Management history:  Previous antiarrhythmic drugs: none Previous cardioversions: none Previous ablations: none CHADS2VASC score: 5 (female, age, CVA, DM) Anticoagulation history: none (on Plavix post CVA)   Past Medical History:  Diagnosis Date  . Anemia   . Arthritis   . CHF (congestive heart failure) ([place])   . Depression   . GERD (gastroesophageal reflux disease)   . Hemorrhoids   . Hyperlipidemia   . Hypertension   . Hypothyroidism   . Seasonal allergies   . Stroke ([place])   . Thyroid cancer ([place])    per pt S/p Total Thyroidectomy with Radioactive Iodine Therapy  . Type 1 diabetes mellitus ([place])    Past Surgical History:  Procedure Laterality Date  . ABDOMINAL ANGIOGRAM N/A 05/14/2012   Procedure: ABDOMINAL ANGIOGRAM;  Surgeon: Laverda Page, MD;  Location: [facility] CATH LAB;  Service: Cardiovascular;  Laterality: N/A;  . ABDOMINAL HYSTERECTOMY  1995   partial  . ANKLE FRACTURE SURGERY Right   . CARDIAC CATHETERIZATION     with coronary angiogram  . COLONOSCOPY N/A 09/09/2014   Procedure: COLONOSCOPY;  Surgeon: Gatha Mayer, MD;  Location: [place];  Service: Endoscopy;  Laterality: N/A;  . ESOPHAGOGASTRODUODENOSCOPY (EGD) WITH PROPOFOL N/A  12/03/2018   Procedure: ESOPHAGOGASTRODUODENOSCOPY (EGD) WITH PROPOFOL;  Surgeon: Doran Stabler, MD;  Location: [place];  Service: Gastroenterology;  Laterality: N/A;  . LEFT AND RIGHT HEART CATHETERIZATION WITH CORONARY ANGIOGRAM N/A 05/14/2012   Procedure: LEFT AND RIGHT HEART CATHETERIZATION WITH CORONARY ANGIOGRAM;  Surgeon: Laverda Page, MD;  Location: [facility] CATH LAB;  Service: Cardiovascular;  Laterality: N/A;  . LOOP RECORDER INSERTION N/A 12/23/2018   Procedure: LOOP RECORDER INSERTION;  Surgeon: Constance Haw, MD;   Location: [place] CV LAB;  Service: Cardiovascular;  Laterality: N/A;  . MINI METER   09/08/2014   ELECTRONIC INSULIN PUMP  . TEE WITHOUT CARDIOVERSION N/A 10/16/2018   Procedure: TRANSESOPHAGEAL ECHOCARDIOGRAM (TEE);  Surgeon: Buford Dresser, MD;  Location: [facility] ENDOSCOPY;  Service: Cardiovascular;  Laterality: N/A;  . THYROIDECTOMY  2006    Current Outpatient Medications  Medication Sig Dispense Refill  . acetaminophen (TYLENOL) 500 MG tablet Take 1,000 mg by mouth as needed for mild pain.    Marland Kitchen atorvastatin (LIPITOR) 80 MG tablet Take 1 tablet (80 mg total) by mouth daily. 90 tablet 2  . CRANBERRY EXTRACT PO Take 1 capsule by mouth daily.    . diclofenac sodium (VOLTAREN) 1 % GEL Apply 4 g topically 4 (four) times daily. 100 g 2  . fexofenadine (ALLEGRA) 30 MG tablet Take 30 mg by mouth daily as needed (allergies).     Marland Kitchen FLUoxetine (PROZAC) 10 MG capsule Take 1 capsule (10 mg total) by mouth daily. 90 capsule 1  . FLUoxetine (PROZAC) 20 MG capsule Take 20 mg by mouth daily.    Marland Kitchen gabapentin (NEURONTIN) 300 MG capsule Take 1 capsule (300 mg total) by mouth at bedtime. 90 capsule 3  . Insulin Human (INSULIN PUMP) SOLN Inject into the skin continuous. humalog insulin    . levothyroxine (SYNTHROID, LEVOTHROID) 175 MCG tablet Take 175 mcg by mouth daily before breakfast.   12  . losartan (COZAAR) 100 MG tablet Take 1 tablet (100 mg total) by mouth daily. 90 tablet 3  . metoprolol succinate (TOPROL-XL) 50 MG 24 hr tablet Take 1 and 1/2 tablets by mouth daily 135 tablet 2  . pantoprazole (PROTONIX) 40 MG tablet Take 1 tablet (40 mg total) by mouth daily. 90 tablet 0  . apixaban (ELIQUIS) 5 MG TABS tablet Take 1 tablet (5 mg total) by mouth 2 (two) times daily. 60 tablet 6   No current facility-administered medications for this encounter.     Allergies  Allergen Reactions  . Latex Rash    Social History   Socioeconomic History  . Marital status: Divorced    Spouse name: Not on  file  . Number of children: 1  . Years of education: Not on file  . Highest education level: Not on file  Occupational History  . Occupation: retired Tour manager  . Financial resource strain: Not on file  . Food insecurity:    Worry: Not on file    Inability: Not on file  . Transportation needs:    Medical: Not on file    Non-medical: Not on file  Tobacco Use  . Smoking status: Never Smoker  . Smokeless tobacco: Never Used  Substance and Sexual Activity  . Alcohol use: No  . Drug use: No  . Sexual activity: Not on file  Lifestyle  . Physical activity:    Days per week: Not on file    Minutes per session: Not on file  . Stress: Not on  file  Relationships  . Social connections:    Talks on phone: Not on file    Gets together: Not on file    Attends religious service: Not on file    Active member of club or organization: Not on file    Attends meetings of clubs or organizations: Not on file    Relationship status: Not on file  . Intimate partner violence:    Fear of current or ex partner: Not on file    Emotionally abused: Not on file    Physically abused: Not on file    Forced sexual activity: Not on file  Other Topics Concern  . Not on file  Social History Narrative   Retired Pharmacist, hospital, high school teacher taught mass. One son, helping to care for her granddaughter. No caffeine. Updated 07/20/2014.     ROS- All systems are reviewed and negative except as per the HPI above.  Physical Exam: Vitals:   02/19/19 1059  BP: (!) 166/70  Pulse: 69  Weight: 119.3 kg  Height: 5\' 7"  (1.702 m)    GEN- The patient is well appearing obese female, alert and oriented x 3 today.   Head- normocephalic, atraumatic Eyes-  Sclera clear, conjunctiva pink Ears- hearing intact Oropharynx- clear Neck- supple  Lungs- Clear to ausculation bilaterally, normal work of breathing Heart- Regular rate and rhythm, occasional ectopic beat heard, no murmurs, rubs or gallops  GI-  soft, NT, ND, + BS Extremities- no clubbing, cyanosis, or edema MS- no significant deformity or atrophy Skin- no rash or lesion Psych- euthymic mood, full affect Neuro- strength and sensation are intact  Wt Readings from Last 3 Encounters:  02/19/19 119.3 kg  02/19/19 119.6 kg  01/15/19 116.5 kg    EKG today demonstrates SR HR 69, PACs, PVC, QRS 94, QTc manually calculated 503 difficult to calculate with ectopy.  Echo 10/14/18 demonstrated  - Procedure narrative: Transthoracic echocardiography. Technically   difficult study. Intravenous contrast (Definity) was   administered. - Left ventricle: The cavity size was normal. There was moderate   concentric hypertrophy. Systolic function was normal. The   estimated ejection fraction was in the range of 60% to 65%. Wall   motion was normal; there were no regional wall motion   abnormalities. The study is not technically sufficient to allow   evaluation of LV diastolic function. - Left atrium: The atrium was normal in size. - Right atrium: The atrium was normal in size.  Epic records are reviewed at length today  Assessment and Plan:  1. Paroxysmal atrial fibrillation The patient has paroxysmal atrial fibrillation noted on ILR. Patient was asymptomatic during the 30 minute episode. Given CHADS2VASC score, will change Plavix to Eliquis 5 mg BID. Increase Toprol to 75 mg daily Check CBC/Bmet  This patients CHA2DS2-VASc Score and unadjusted Ischemic Stroke Rate (% per year) is equal to 7.2 % stroke rate/year from a score of 5  Above score calculated as 1 point each if present [CHF, HTN, DM, Vascular=MI/PAD/Aortic Plaque, Age if 65-74, or Female] Above score calculated as 2 points each if present [Age > 75, or Stroke/TIA/TE]   2. Obesity Body mass index is 41.19 kg/m. Lifestyle modification was discussed at length including regular exercise and weight reduction.  3. Snoring Plans for sleep study noted.  4. HTN BP elevated  today. Increase BB as above.  5. HLD Continue statin therapy.   Follow up in [facility] Clinic in one month.  [facility]  [address] [place], [place] [address] [phone] 02/19/2019 1:09 PM

## 2019-02-19 NOTE — Patient Instructions (Signed)
Increase metoprolol to 75mg  once a day (1 and 1/2 tablets of the 50mg  a day)  Stop plavix  Start Eliquis 5mg  twice a day

## 2019-02-19 NOTE — Progress Notes (Signed)
[facility] [address] [place]. [place] [address] [phone]       OFFICE FOLLOW UP NOTE  [name] Date of Birth:  May 20, 1951 Medical Record Number:  [id number]   Reason for Referral:  hospital stroke follow up  CHIEF COMPLAINT:  Chief Complaint  Patient presents with  . Follow-up    Stroke follow up pt stated she is [place] for 15 days for rehab now she is home    HPI: 02/19/19 VISIT  [name] is a 68 year old female who returns today for follow-up after right pontine infarct in 09/2018.  She unfortunately sustained a couple falls and was evaluated in the ED on 12/13/2018 for right knee pain.  Per review of the ED notes, she was fearful of returning home due to safety concerns and was requesting to be placed at rehab facility therefore she was discharged to [facility] for ongoing therapy.  She has been since discharged home.  Unfortunately due to lack of transportation, she was unable to participate in therapies prior to 12/13/2018 admission.  As she has since obtained SCAT assistance, she is currently participating in physical therapy at neuro rehab for ongoing deficits and overall feeling of being weaker leading to increased difficulty in walking. She currently lives with her sister who works full time leaving patient at home during the day. She is able to maintain ADL's without assistance and able to cook and clean without assistance. She is currently using rollator walker with a recent fall on Sunday stating her feet "got twisted". She did attempt 30 day cardiac monitor but due to allergic skin reaction, unable to fully complete. she underwent loop recorder placement on 12/23/2018 which has shown atrial fibrillation on 02/09/2019 and has appointment to follow-up in AF clinic today for discussion of initiating [place].  She continues on Plavix at this time without side effects of bleeding or bruising.  Continues on atorvastatin without  side effects myalgias.  Blood pressure today initially elevated but upon manual recheck 148/90.  She continues to monitor glucose levels at home and have been ranging in the low 100s.  Denies new or worsening stroke/TIA symptoms.    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  She was previously followed in this office Dr. Erlinda Hong for prior stroke.  History obtained from patient and chart review. Reviewed all radiology images and labs personally.  [name] is a 68 y.o. female with history of HTN, HLD, prior stroke, CHF, type I DB, thyroid cancer presenting with L hemiparesis and L sided numbness.  CT head reviewed and was negative for acute infarct but did show old left pontine lacune infarct.  CTA head and neck stable since imaging in 2016 which showed high-grade stenosis bilateral ICA segments and bilateral distal left VA V4.  MRI brain reviewed and showed right pontine infarct along with small vessel disease and old pontine lacunar infarcts.  2D echo showed an EF of 60 to 65% without evidence of cardiac thrombus but did show atrial fibrillation.  As there is no clear evidence on telemetry monitor of atrial fibrillation, TEE performed which was unremarkable without evidence of thrombus or PFO.  Recommended to undergo 30-day event cardiac monitor to rule out atrial fibrillation as during 2D echo showed possible AF.  Recommended a 30-day cardiac event monitor does not demonstrate AF, loop  recorder will be considered.  Patient was on Plavix PTA and recommended DAPT for 3 weeks and then single agent alone.  LDL 48 and recommended continuation of atorvastatin 40 mg daily.  A1c 9.8 and recommended tight glycemic control with close PCP follow-up for DM management.  HTN stable during admission recommended long-term goal normotensive range.  Therapy recommended home  health PT/OT and was discharged in stable condition.  Patient is being seen today for hospital follow-up.  She did have cardiac monitor started on 10/28/2018 and will be completed on 11/27/2018. She continues to have left sided weakness with some improvement. She does endorse numbness and heaviness sensation in left leg. She does endorse muscle stiffness in left arm and neck but denies pain. She has recently completed home health PT/OT. She would like to continue outpatient therapy at our neuro rehab. She continues to use rollator walker when she is outside which she was using prior. She continues on asprin and plavix without side effects of bleeding or bruising. Recent episode of GERD symptoms and was told this could possibly be due to use of aspirin. Continues to take lipitor without side effects of myalgias. Blood pressure satisfactory 142/71.  No further concerns at this time.  Denies new or worsening stroke/TIA symptoms. On a side note, she does complain of left toe pain and swelling which per review of epic notes, she has reached out to her PCP in this regards and is recommended to be seen in office as this could be possibly related to gout or a different type of condition that may need to be worked up.  She states she has a difficult time scheduling too many appointments soon together as she is unable to drive and does not want her family members missing too much work.  She does have an appointment with endocrinology on Monday and after that we will be being seen by cardiology for completion of 30-day cardiac monitor and then plans on scheduling appointment with PCP.    ROS:   14 system review of systems performed and negative with exception of see HPI  PMH:  Past Medical History:  Diagnosis Date  . Anemia   . Arthritis   . CHF (congestive heart failure) ([place])   . Depression   . GERD (gastroesophageal reflux disease)   . Hemorrhoids   . Hyperlipidemia   . Hypertension   . Hypothyroidism   .  Seasonal allergies   . Stroke ([place])   . Thyroid cancer ([place])    per pt S/p Total Thyroidectomy with Radioactive Iodine Therapy  . Type 1 diabetes mellitus ([facility])     PSH:  Past Surgical History:  Procedure Laterality Date  . ABDOMINAL ANGIOGRAM N/A 05/14/2012   Procedure: ABDOMINAL ANGIOGRAM;  Surgeon: Laverda Page, MD;  Location: [facility] CATH LAB;  Service: Cardiovascular;  Laterality: N/A;  . ABDOMINAL HYSTERECTOMY  1995   partial  . ANKLE FRACTURE SURGERY Right   . CARDIAC CATHETERIZATION     with coronary angiogram  . COLONOSCOPY N/A 09/09/2014   Procedure: COLONOSCOPY;  Surgeon: Gatha Mayer, MD;  Location: [facility];  Service: Endoscopy;  Laterality: N/A;  . ESOPHAGOGASTRODUODENOSCOPY (EGD) WITH PROPOFOL N/A 12/03/2018   Procedure: ESOPHAGOGASTRODUODENOSCOPY (EGD) WITH PROPOFOL;  Surgeon: Doran Stabler, MD;  Location: [facility];  Service: Gastroenterology;  Laterality: N/A;  . LEFT AND RIGHT HEART CATHETERIZATION WITH CORONARY ANGIOGRAM N/A 05/14/2012   Procedure: LEFT AND RIGHT HEART CATHETERIZATION WITH CORONARY ANGIOGRAM;  Surgeon: Laverda Page, MD;  Location: [facility] CATH LAB;  Service: Cardiovascular;  Laterality: N/A;  . LOOP RECORDER INSERTION N/A 12/23/2018   Procedure: LOOP RECORDER INSERTION;  Surgeon: Constance Haw, MD;  Location: [facility] CV LAB;  Service: Cardiovascular;  Laterality: N/A;  . MINI METER   09/08/2014   ELECTRONIC INSULIN PUMP  . TEE WITHOUT CARDIOVERSION N/A 10/16/2018   Procedure: TRANSESOPHAGEAL ECHOCARDIOGRAM (TEE);  Surgeon: Buford Dresser, MD;  Location[facility] ENDOSCOPY;  Service: Cardiovascular;  Laterality: N/A;  . THYROIDECTOMY  2006    Social History:  Social History   Socioeconomic History  . Marital status: Divorced    Spouse name: Not on file  . Number of children: 1  . Years of education: Not on file  . Highest education level: Not on file  Occupational History  . Occupation: retired Tour manager   . Financial resource strain: Not on file  . Food insecurity:    Worry: Not on file    Inability: Not on file  . Transportation needs:    Medical: Not on file    Non-medical: Not on file  Tobacco Use  . Smoking status: Never Smoker  . Smokeless tobacco: Never Used  Substance and Sexual Activity  . Alcohol use: No  . Drug use: No  . Sexual activity: Not on file  Lifestyle  . Physical activity:    Days per week: Not on file    Minutes per session: Not on file  . Stress: Not on file  Relationships  . Social connections:    Talks on phone: Not on file    Gets together: Not on file    Attends religious service: Not on file    Active member of club or organization: Not on file    Attends meetings of clubs or organizations: Not on file    Relationship status: Not on file  . Intimate partner violence:    Fear of current or ex partner: Not on file    Emotionally abused: Not on file    Physically abused: Not on file    Forced sexual activity: Not on file  Other Topics Concern  . Not on file  Social History Narrative   Retired Pharmacist, hospital, high school teacher taught mass. One son, helping to care for her granddaughter. No caffeine. Updated 07/20/2014.    Family History:  Family History  Problem Relation Age of Onset  . Heart disease Mother   . Hyperlipidemia Mother   . Hypertension Mother   . Renal Disease Mother        insufficiency  . Heart attack Mother        in his 70's  . Early death Father        Head Injury at Work  . Hyperlipidemia Father   . Hypertension Father   . Stroke Son   . Diabetes Maternal Aunt   . Prostate cancer Paternal Uncle   . Heart disease Maternal Aunt   . Heart failure Maternal Grandfather   . Heart disease Maternal Grandfather   . Heart attack Maternal Grandfather        Died in his 37's  . Breast cancer Neg Hx     Medications:   Current Outpatient Medications on File Prior to Visit  Medication Sig Dispense Refill  . acetaminophen  (TYLENOL) 500 MG tablet Take 1,000 mg by mouth as needed for mild pain.    Marland Kitchen atorvastatin (LIPITOR) 80 MG tablet Take 1 tablet (80 mg total) by mouth daily. 90 tablet  2  . clopidogrel (PLAVIX) 75 MG tablet TAKE 1 TABLET(75 MG) BY MOUTH DAILY 90 tablet 0  . CRANBERRY EXTRACT PO Take 1 capsule by mouth daily.    . diclofenac sodium (VOLTAREN) 1 % GEL Apply 4 g topically 4 (four) times daily. 100 g 2  . fexofenadine (ALLEGRA) 30 MG tablet Take 30 mg by mouth daily as needed (allergies).     Marland Kitchen FLUoxetine (PROZAC) 10 MG capsule Take 1 capsule (10 mg total) by mouth daily. 90 capsule 1  . FLUoxetine (PROZAC) 20 MG tablet Take 1.5 tablets (30 mg total) by mouth daily. 135 tablet 2  . gabapentin (NEURONTIN) 300 MG capsule Take 1 capsule (300 mg total) by mouth at bedtime. 90 capsule 3  . Insulin Human (INSULIN PUMP) SOLN Inject into the skin continuous. humalog insulin    . levothyroxine (SYNTHROID, LEVOTHROID) 175 MCG tablet Take 175 mcg by mouth daily before breakfast.   12  . losartan (COZAAR) 100 MG tablet Take 1 tablet (100 mg total) by mouth daily. 90 tablet 3  . metoprolol succinate (TOPROL-XL) 50 MG 24 hr tablet TAKE 1 TABLET BY MOUTH DAILY 90 tablet 0  . pantoprazole (PROTONIX) 40 MG tablet Take 1 tablet (40 mg total) by mouth daily. 90 tablet 0   No current facility-administered medications on file prior to visit.     Allergies:   Allergies  Allergen Reactions  . Latex Rash     Physical Exam  Vitals:   02/19/19 0919  BP: (!) 148/90  Pulse: 94  Weight: 263 lb 9.6 oz (119.6 kg)  Height: 5\' 7"  (1.702 m)   Body mass index is 41.29 kg/m. No exam data present  General: Pleasant obese middle-aged African-American female, seated, in no evident distress Head: head normocephalic and atraumatic.   Neck: supple with no carotid or supraclavicular bruits Cardiovascular: irregular rate and rhythm, no murmurs Musculoskeletal: no deformity Skin:  no rash/petichiae Vascular:  Normal  pulses all extremities  Neurologic Exam Mental Status: Awake and fully alert. Oriented to place and time. Recent and remote memory intact. Attention span, concentration and fund of knowledge appropriate. Mood and affect appropriate.  Cranial Nerves: Pupils equal, briskly reactive to light. Extraocular movements full without nystagmus. Visual fields full to confrontation. Hearing intact. Facial sensation intact. Face, tongue, palate moves normally and symmetrically.  Motor: Normal bulk and tone. LUE: 5/5 with mildly weak grip strength; LLE: 5/5 with slighly weak ankle dorsiflexon; no spacticity noted  Sensory.: decreased sensation left upper extremity and left lower extremity but she does feel as though sensation is improving Coordination: Orbits right arm over left arm.  Decreased left hand finger dexterity. Gait and Station: Arises from chair with mild difficulty. Stance is normal. Gait demonstrates normal stride length and balance with use of Rollator walker.  Reflexes: 1+ and symmetric. Toes downgoing.      Diagnostic Data (Labs, Imaging, Testing)  CT HEAD WO CONTRAST 10/13/2018 IMPRESSION: 1. No acute finding.  ASPECTS is 10. 2. 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  CT ANGIO HEAD W OR WO CONTRAST CT ANGIO NECK W OR WO CONTRAST 10/14/2018 IMPRESSION: 1. 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 There is comparatively little extracranial atherosclerosis, and no plaque or stenosis identified in the circle of Willis branches. 2. Stable CT appearance of the brain since yesterday. No acute or evolving  infarct identified.  MR BRAIN WO CONTRAST 10/14/2018 IMPRESSION: 1. Motion degraded examination. Acute subcentimeter RIGHT pontine infarct. 2. Mild chronic small vessel ischemic changes, old pontine  lacunar infarcts.  ECHOCARDIOGRAM 10/14/2018 Impressions: - Technically difficult study. Afib is noted. LVEF 60-65%, moderate   LVH, normal wall motion, normal biatrial size.  Echo TEE 10/16/2018 Study Conclusions - Left ventricle: Systolic function was normal. The estimated   ejection fraction was in the range of 60% to 65%. Wall motion was   normal; there were no regional wall motion abnormalities. - Aortic valve: Trileaflet; mildly thickened, mildly calcified   leaflets. There was no significant regurgitation. - Mitral valve: There was trivial regurgitation. - Left atrium: No evidence of thrombus in the atrial cavity or   appendage. - Right atrium: No evidence of thrombus in the atrial cavity or   appendage. - Atrial septum: No defect or patent foramen ovale was identified.   Echo contrast study showed a trivial late right-to-left shunt, in   the baseline state. This likely represents non-cardiac shunt. - Tricuspid valve: There was mild regurgitation.   ASSESSMENT: [name] is a 68 y.o. year old female here with right pontine infarct on 10/13/2018 secondary to small vessel disease. Vascular risk factors include HTN, HLD, prior stroke, CHF, DM and thyroid cancer.  She is being seen today for follow-up visit and has made improvements with prior left hemiparesis with continued deficits of mild left hand decreased dexterity and mild left ankle dorsiflexion weakness.    PLAN:  1. Right pontine infarct: Continue clopidogrel 75 mg daily  and atorvastatin 80 mg for secondary stroke prevention.  Advised patient to discontinue aspirin 325 mg at this time as 3-week DAPT completed and will continue on Plavix due to possible stomach sensitivities with use of aspirin.  Maintain strict control of hypertension with blood pressure goal below 130/90, diabetes with hemoglobin A1c goal below 6.5% and cholesterol with LDL cholesterol (bad cholesterol) goal below 70 mg/dL.  I also  advised the patient to eat a healthy diet with plenty of whole grains, cereals, fruits and vegetables, exercise regularly with at least 30 minutes of continuous activity daily and maintain ideal body weight. 2. Residual weakness: Highly encouraged continued participation in PT and recommended initiating OT with referral placed to neuro rehab.  Advised her that she has made improvement regarding her left-sided weakness and residual is mild.  Her subjective complaints of generalized weakness is likely more related to anxiety and fear of falling with continued falls at home.  Discussion with her regarding importance of staying active and doing as many functional activities as she can on her own (she requested nurse place her jacket back on after was removed for blood pressure reading despite she was able to remove on her own) 3. HTN: Advised to continue current treatment regimen.  Today's BP 148/90.  Advised to continue to monitor at home along with continued follow-up with PCP for management 4. HLD: Advised to continue current treatment regimen along with continued follow-up with PCP for future prescribing and monitoring of lipid panel 5. DMII: Advised to continue to monitor glucose levels at home along with continued follow-up with PCP for management and monitoring 6. Atrial fibrillation: Appointment scheduled this afternoon with atrial fibrillation clinic to discuss possible initiation of [place].  She is currently in atrial fibrillation at today's appointment without reported symptoms advised her that it will be important to avoid falls with use of stronger blood thinners and to always maintain fall  precautions. 7. Potential OSA: Due to recent diagnosis of A. fib with underlying history of stroke, HTN, HLD, CHF, DM and obesity, recommended to be evaluated for possible underlying OSA.  Educated on increased risk factors with untreated OSA.  She was agreeable for OSA evaluation.  Referral placed to [place] sleep  clinic   Follow-up in 6 months or call earlier if needed   Greater than 50% of time during this 25 minute visit was spent on counseling, explanation of diagnosis of right pontine infarct, reviewing risk factor management of new A. fib, HTN, HLD, prior infarct, CHF and DM, discussion regarding OSA, discussion regarding fall precautions especially with use of [place], planning of further management along with potential future management, and discussion with patient and family answering all questions.    Venancio Poisson, AGNP-BC  [facility] [address] [place], [place] [address]  Phone [phone] Fax [phone] Note: This document was prepared with digital dictation and possible smart phrase technology. Any transcriptional errors that result from this process are unintentional.

## 2019-02-19 NOTE — Patient Instructions (Addendum)
Continue clopidogrel 75 mg daily  and lipitor  for secondary stroke prevention  Continue to follow up with PCP regarding cholesterol, blood presure and diabetes management   Follow up with cardiology regarding new atrial fibrillation   Continue physical therapy and start occupation therapy  Referral placed to [place] [facility] clinic for evaluation of potential sleep apnea  Continue to monitor blood pressure at home  Maintain strict control of hypertension with blood pressure goal below 130/90, diabetes with hemoglobin A1c goal below 6.5% and cholesterol with LDL cholesterol (bad cholesterol) goal below 70 mg/dL. I also advised the patient to eat a healthy diet with plenty of whole grains, cereals, fruits and vegetables, exercise regularly and maintain ideal body weight.  Followup in the future with me in 6 months or call earlier if needed       Thank you for coming to see [place] at [facility]. I hope we have been able to provide you high quality care today.  You may receive a patient satisfaction survey over the next few weeks. We would appreciate your feedback and comments so that we may continue to improve ourselves and the health of our patients.

## 2019-02-19 NOTE — Progress Notes (Signed)
Stopping Plavix and initiating Eliquis is agreed with.  No need for continuation of Plavix.  Thank you for the update.

## 2019-02-19 NOTE — Progress Notes (Signed)
I agree with the above plan 

## 2019-02-20 ENCOUNTER — Ambulatory Visit: Payer: Medicare Other | Admitting: Physical Therapy

## 2019-02-20 ENCOUNTER — Encounter: Payer: Self-pay | Admitting: Physical Therapy

## 2019-02-20 DIAGNOSIS — R293 Abnormal posture: Secondary | ICD-10-CM

## 2019-02-20 DIAGNOSIS — M6281 Muscle weakness (generalized): Secondary | ICD-10-CM

## 2019-02-20 DIAGNOSIS — R42 Dizziness and giddiness: Secondary | ICD-10-CM

## 2019-02-20 DIAGNOSIS — Z9181 History of falling: Secondary | ICD-10-CM

## 2019-02-20 DIAGNOSIS — R2689 Other abnormalities of gait and mobility: Secondary | ICD-10-CM

## 2019-02-20 DIAGNOSIS — R2681 Unsteadiness on feet: Secondary | ICD-10-CM | POA: Diagnosis not present

## 2019-02-20 NOTE — Therapy (Signed)
[facility] [address] [place], [place], [address] Phone: [phone]   Fax:  [phone]  Physical Therapy Treatment  Patient Details  Name: [name] MRN: [id number] Date of Birth: 07/04/1951 Referring Provider (PT): Andrena Mews, MD   Encounter Date: 02/20/2019  PT End of Session - 02/20/19 1412    Visit Number  3    Number of Visits  17    Authorization Type  UHC Medicare    PT Start Time  0800    PT Stop Time  0845    PT Time Calculation (min)  45 min    Equipment Utilized During Treatment  Gait belt    Activity Tolerance  Patient tolerated treatment well;Patient limited by fatigue    Behavior During Therapy  [facility] for tasks assessed/performed       Past Medical History:  Diagnosis Date  . Anemia   . Arthritis   . CHF (congestive heart failure) ([place])   . Depression   . GERD (gastroesophageal reflux disease)   . Hemorrhoids   . Hyperlipidemia   . Hypertension   . Hypothyroidism   . Seasonal allergies   . Stroke ([place])   . Thyroid cancer ([place])    per pt S/p Total Thyroidectomy with Radioactive Iodine Therapy  . Type 1 diabetes mellitus ([place])     Past Surgical History:  Procedure Laterality Date  . ABDOMINAL ANGIOGRAM N/A 05/14/2012   Procedure: ABDOMINAL ANGIOGRAM;  Surgeon: Laverda Page, MD;  Location: [facility] CATH LAB;  Service: Cardiovascular;  Laterality: N/A;  . ABDOMINAL HYSTERECTOMY  1995   partial  . ANKLE FRACTURE SURGERY Right   . CARDIAC CATHETERIZATION     with coronary angiogram  . COLONOSCOPY N/A 09/09/2014   Procedure: COLONOSCOPY;  Surgeon: Gatha Mayer, MD;  Location: [facility];  Service: Endoscopy;  Laterality: N/A;  . ESOPHAGOGASTRODUODENOSCOPY (EGD) WITH PROPOFOL N/A 12/03/2018   Procedure: ESOPHAGOGASTRODUODENOSCOPY (EGD) WITH PROPOFOL;  Surgeon: Doran Stabler, MD;  Location: [facility];  Service: Gastroenterology;  Laterality: N/A;  . LEFT AND  RIGHT HEART CATHETERIZATION WITH CORONARY ANGIOGRAM N/A 05/14/2012   Procedure: LEFT AND RIGHT HEART CATHETERIZATION WITH CORONARY ANGIOGRAM;  Surgeon: Laverda Page, MD;  Location: [facility] CATH LAB;  Service: Cardiovascular;  Laterality: N/A;  . LOOP RECORDER INSERTION N/A 12/23/2018   Procedure: LOOP RECORDER INSERTION;  Surgeon: Constance Haw, MD;  Location: [place] CV LAB;  Service: Cardiovascular;  Laterality: N/A;  . MINI METER   09/08/2014   ELECTRONIC INSULIN PUMP  . TEE WITHOUT CARDIOVERSION N/A 10/16/2018   Procedure: TRANSESOPHAGEAL ECHOCARDIOGRAM (TEE);  Surgeon: Buford Dresser, MD;  Location: [facility] ENDOSCOPY;  Service: Cardiovascular;  Laterality: N/A;  . THYROIDECTOMY  2006    There were no vitals filed for this visit.  Subjective Assessment - 02/20/19 0800    Subjective  She went to [facility] and reactivated her [employer]. She found the machine that PT talked about.     Pertinent History  CVA 2016, HTN, thyroid CA, arthritis, CHF, depression, DM1    Limitations  Lifting;Standing;Walking;House hold activities    Patient Stated Goals  To improve left side strength, walking & balance    Currently in Pain?  Yes    Pain Score  4     Pain Location  Back    Pain Orientation  Mid;Lower    Pain Descriptors / Indicators  Aching    Pain Type  Chronic pain  Pain Onset  Yesterday    Pain Frequency  Constant    Aggravating Factors   sitting & laying down    Pain Relieving Factors  tylenol                            Balance Exercises - 02/20/19 0800      OTAGO PROGRAM   Head Movements  Sitting;5 reps    Neck Movements  Sitting;5 reps    Back Extension  Standing;5 reps   locked rollator support   Trunk Movements  Standing;5 reps   locked rollator support   Ankle Movements  Sitting;10 reps    Knee Extensor  10 reps    Knee Flexor  10 reps   standing with locked rollator support   Hip ABductor  10 reps   standing  with locked rollator support   Ankle Plantorflexors  --   10 reps standing with locked rollator support   Ankle Dorsiflexors  --   10 reps standing with locked rollator support   Knee Bends  10 reps, support   locked rollator support       PT Education - 02/20/19 0845    Education Details  OTAGO HEP    Person(s) Educated  Patient    Methods  Explanation;Demonstration;Tactile cues;Verbal cues;Handout    Comprehension  Verbalized understanding;Returned demonstration;Verbal cues required;Tactile cues required;Need further instruction       PT Short Term Goals - 02/11/19 1800      PT SHORT TERM GOAL #1   Title  Patient verbalizes & demonstrates understanding of initial HEP. (All STGs Target Date: 03/14/2019)    Time  4    Period  Weeks    Status  New    Target Date  03/14/19      PT SHORT TERM GOAL #2   Title  Patient verbalizes understanding of initial community based fitness activities like [employer].     Time  4    Period  Weeks    Status  New    Target Date  03/14/19      PT SHORT TERM GOAL #3   Title  Berg Balance >/= 36/56    Time  4    Period  Weeks    Status  New    Target Date  03/14/19      PT SHORT TERM GOAL #4   Title  Patient ambulates 250' with rollator walker with supervision.     Time  4    Period  Weeks    Status  New    Target Date  03/14/19        PT Long Term Goals - 02/11/19 1800      PT LONG TERM GOAL #1   Title  Patient verbalizes & demonstrates understanding of HEP & Ongoing fitness plan. (All LTGs Target Date: 04/11/2019)    Time  8    Period  Weeks    Status  New    Target Date  04/11/19      PT LONG TERM GOAL #2   Title  Berg Balance >/= 40/56 to indicate lower fall risk.     Time  8    Period  Weeks    Status  New    Target Date  04/11/19      PT LONG TERM GOAL #3   Title  Timed Up & Go <13.5sec with rollator walker to indicate lower fall risk.  Time  8    Period  Weeks    Status  New    Target Date  04/11/19      PT LONG  TERM GOAL #4   Title  Patient ambulates 400' with rollator walker modified independent.    Time  8    Period  Weeks    Status  New    Target Date  04/11/19      PT LONG TERM GOAL #5   Title  Patient negotiates ramps & curbs with rollator walker and stairs with single rail similar to home modified independent.     Time  8    Period  Weeks    Status  New    Target Date  04/11/19            Plan - 02/20/19 0757    Clinical Impression Statement  PT initiated instruction in [place] as HEP. Patient has back pain with supine exercises so PT replaced with seated posterior pelvic tilts, seated clamshells BLE & single LEs with red theraband. Patient reported improved back pain at end of session today.     Rehab Potential  Good    PT Frequency  2x / week    PT Duration  8 weeks    PT Treatment/Interventions  ADLs/Self Care Home Management;Cryotherapy;Moist Heat;Ultrasound;DME Instruction;Gait training;Stair training;Functional mobility training;Therapeutic activities;Therapeutic exercise;Balance training;Neuromuscular re-education;Patient/family education;Dry needling;Vestibular    PT Next Visit Plan  check [place] thus far & add more OTAGO exercises to program. check on [employer],    PT Home Exercise Plan  Access Code: [email or web address]    Consulted and Agree with Plan of Care  Patient       Patient will benefit from skilled therapeutic intervention in order to improve the following deficits and impairments:  Abnormal gait, Decreased activity tolerance, Decreased balance, Decreased coordination, Decreased endurance, Decreased mobility, Decreased strength, Dizziness, Impaired flexibility, Postural dysfunction, Obesity, Pain  Visit Diagnosis: Unsteadiness on feet  Other abnormalities of gait and mobility  Muscle weakness (generalized)  History of falling  Dizziness and giddiness  Abnormal posture     Problem List Patient Active Problem List   Diagnosis Date Noted  . Hot flashes  12/11/2018  . 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 Abnormal echocardiogram   . Stroke (cerebrum) ([place]) 10/13/2018  . Frequent PVCs   . Uncontrolled type 1 diabetes mellitus ([place])   . BPPV (benign paroxysmal positional vertigo), right 06/13/2017  . Premature atrial beats 06/13/2017  . Snoring 07/31/2016  . Pre-ulcerative corn or callous 12/15/2015  . History of CHF (congestive heart failure) 10/29/2015  . Essential hypertension 10/29/2015  . Cerebrovascular accident (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 ([place]) 10/29/2015  . Thyroid activity decreased   . Intracranial vascular stenosis   . HLD (hyperlipidemia)   . 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 CVA (cerebral vascular accident) ([place]) 08/29/2015  . Posterior tibial tendinitis of right leg 04/26/2014  . Atypical chest pain 10/06/2013  . Morbid obesity ([place]) 05/29/2013  . Back pain without radiation 04/28/2013  . Nail dystrophy 01/10/2013  . Diabetes mellitus ([place]) 01/25/2012  . Hyperlipidemia 01/25/2012  . Hypothyroidism 01/25/2012  . Hypertension 01/25/2012  . GERD (gastroesophageal reflux disease) 01/25/2012    Jamey Reas PT, DPT 02/20/2019, 4:47 PM  [facility] [address] [place], [place], [address] Phone: [phone]   Fax:  [phone]  Name: Ridley Dileo MRN: [id number] Date  of Birth: Jul 05, 1951

## 2019-02-24 ENCOUNTER — Other Ambulatory Visit: Payer: Self-pay | Admitting: Cardiology

## 2019-02-25 ENCOUNTER — Encounter: Payer: Self-pay | Admitting: Physical Therapy

## 2019-02-25 ENCOUNTER — Ambulatory Visit: Payer: Medicare Other | Attending: Adult Health | Admitting: Physical Therapy

## 2019-02-25 ENCOUNTER — Encounter: Payer: Self-pay | Admitting: Family Medicine

## 2019-02-25 DIAGNOSIS — R293 Abnormal posture: Secondary | ICD-10-CM | POA: Diagnosis present

## 2019-02-25 DIAGNOSIS — M6281 Muscle weakness (generalized): Secondary | ICD-10-CM | POA: Insufficient documentation

## 2019-02-25 DIAGNOSIS — R2689 Other abnormalities of gait and mobility: Secondary | ICD-10-CM | POA: Diagnosis present

## 2019-02-25 DIAGNOSIS — R2681 Unsteadiness on feet: Secondary | ICD-10-CM | POA: Diagnosis not present

## 2019-02-25 DIAGNOSIS — R42 Dizziness and giddiness: Secondary | ICD-10-CM | POA: Diagnosis present

## 2019-02-25 NOTE — Therapy (Signed)
[facility] [address] [place], [place], [address] Phone: [phone]   Fax:  [phone]  Physical Therapy Treatment  Patient Details  Name: [name] MRN: [id number] Date of Birth: 1951/04/11 Referring Provider (PT): Andrena Mews, MD   Encounter Date: 02/25/2019  PT End of Session - 02/25/19 1108    Visit Number  4    Number of Visits  17    Authorization Type  UHC Medicare    PT Start Time  4628    PT Stop Time  1145    PT Time Calculation (min)  42 min    Equipment Utilized During Treatment  Gait belt    Activity Tolerance  Patient tolerated treatment well;Patient limited by fatigue    Behavior During Therapy  [facility] for tasks assessed/performed       Past Medical History:  Diagnosis Date  . Anemia   . Arthritis   . CHF (congestive heart failure) ([place])   . Depression   . GERD (gastroesophageal reflux disease)   . Hemorrhoids   . Hyperlipidemia   . Hypertension   . Hypothyroidism   . Seasonal allergies   . Stroke ([place])   . Thyroid cancer ([place])    per pt S/p Total Thyroidectomy with Radioactive Iodine Therapy  . Type 1 diabetes mellitus ([place])     Past Surgical History:  Procedure Laterality Date  . ABDOMINAL ANGIOGRAM N/A 05/14/2012   Procedure: ABDOMINAL ANGIOGRAM;  Surgeon: Laverda Page, MD;  Location: [facility] CATH LAB;  Service: Cardiovascular;  Laterality: N/A;  . ABDOMINAL HYSTERECTOMY  1995   partial  . ANKLE FRACTURE SURGERY Right   . CARDIAC CATHETERIZATION     with coronary angiogram  . COLONOSCOPY N/A 09/09/2014   Procedure: COLONOSCOPY;  Surgeon: Gatha Mayer, MD;  Location: [facility];  Service: Endoscopy;  Laterality: N/A;  . ESOPHAGOGASTRODUODENOSCOPY (EGD) WITH PROPOFOL N/A 12/03/2018   Procedure: ESOPHAGOGASTRODUODENOSCOPY (EGD) WITH PROPOFOL;  Surgeon: Doran Stabler, MD;  Location: [facility];  Service: Gastroenterology;  Laterality: N/A;  . LEFT AND RIGHT  HEART CATHETERIZATION WITH CORONARY ANGIOGRAM N/A 05/14/2012   Procedure: LEFT AND RIGHT HEART CATHETERIZATION WITH CORONARY ANGIOGRAM;  Surgeon: Laverda Page, MD;  Location: [facility] CATH LAB;  Service: Cardiovascular;  Laterality: N/A;  . LOOP RECORDER INSERTION N/A 12/23/2018   Procedure: LOOP RECORDER INSERTION;  Surgeon: Constance Haw, MD;  Location: [facility] CV LAB;  Service: Cardiovascular;  Laterality: N/A;  . MINI METER   09/08/2014   ELECTRONIC INSULIN PUMP  . TEE WITHOUT CARDIOVERSION N/A 10/16/2018   Procedure: TRANSESOPHAGEAL ECHOCARDIOGRAM (TEE);  Surgeon: Buford Dresser, MD;  Location: [facility] ENDOSCOPY;  Service: Cardiovascular;  Laterality: N/A;  . THYROIDECTOMY  2006    There were no vitals filed for this visit.  Subjective Assessment - 02/25/19 1106    Subjective  No falls to report. Has been doing her HEP daily  with less back pain since the modifications from last session. Has not been back to [facility] due to her sister has not had time to go with her. Hoping to go this coming weekend.     Pertinent History  CVA 2016, HTN, thyroid CA, arthritis, CHF, depression, DM1, A-fib    Limitations  Lifting;Standing;Walking;House hold activities    Patient Stated Goals  To improve left side strength, walking & balance    Currently in Pain?  Yes    Pain Score  2     Pain Location  Back    Pain Orientation  Mid;Lower    Pain Descriptors / Indicators  Aching    Pain Type  Chronic pain;Acute pain   worse since recent fall, however improving   Pain Onset  More than a month ago    Pain Frequency  Intermittent    Aggravating Factors   sitting and laying down    Pain Relieving Factors  tylenol          [facility] Adult PT Treatment/Exercise - 02/25/19 1147      Transfers   Transfers  Sit to Stand;Stand to Sit    Sit to Stand  5: Supervision;With upper extremity assist;With armrests;From chair/3-in-1    Stand to Sit  5: Supervision;With upper extremity assist;With armrests;To  chair/3-in-1      Ambulation/Gait   Ambulation/Gait  Yes    Ambulation/Gait Assistance  4: Min guard;5: Supervision    Ambulation/Gait Assistance Details  cues needed for posture and rollator position with gait.     Ambulation Distance (Feet)  50 Feet   x2    Assistive device  Rollator    Gait Pattern  Step-through pattern;Decreased stance time - left;Decreased step length - right;Decreased weight shift to left;Antalgic;Trunk flexed;Wide base of support    Ambulation Surface  Level;Indoor      Exercises   Exercises  Other Exercises    Other Exercises   reviewd non OTAGO ex's issued to HEP. cues for technique and to slow down for improved benefit of ex.           Balance Exercises - 02/25/19 1110      OTAGO PROGRAM   Head Movements  Sitting;5 reps    Neck Movements  Sitting;5 reps    Back Extension  Standing;5 reps    Trunk Movements  Standing;5 reps    Ankle Movements  Sitting;10 reps    Knee Extensor  10 reps    Knee Flexor  10 reps    Hip ABductor  10 reps    Ankle Plantorflexors  --   10reps   Ankle Dorsiflexors  --   10 reps   Knee Bends  10 reps, support    Tandem Stance  10 seconds, support    One Leg Stand  10 seconds, support    Sit to Stand  10 reps, bilateral support    Overall OTAGO Comments  use of rollator for stability with all standing ex's.  cues on correct form,           PT Short Term Goals - 02/11/19 1800      PT SHORT TERM GOAL #1   Title  Patient verbalizes & demonstrates understanding of initial HEP. (All STGs Target Date: 03/14/2019)    Time  4    Period  Weeks    Status  New    Target Date  03/14/19      PT SHORT TERM GOAL #2   Title  Patient verbalizes understanding of initial community based fitness activities like [employer].     Time  4    Period  Weeks    Status  New    Target Date  03/14/19      PT SHORT TERM GOAL #3   Title  Berg Balance >/= 36/56    Time  4    Period  Weeks    Status  New    Target Date  03/14/19      PT  SHORT TERM GOAL #4   Title  Patient ambulates  250' with rollator walker with supervision.     Time  4    Period  Weeks    Status  New    Target Date  03/14/19        PT Long Term Goals - 02/11/19 1800      PT LONG TERM GOAL #1   Title  Patient verbalizes & demonstrates understanding of HEP & Ongoing fitness plan. (All LTGs Target Date: 04/11/2019)    Time  8    Period  Weeks    Status  New    Target Date  04/11/19      PT LONG TERM GOAL #2   Title  Berg Balance >/= 40/56 to indicate lower fall risk.     Time  8    Period  Weeks    Status  New    Target Date  04/11/19      PT LONG TERM GOAL #3   Title  Timed Up & Go <13.5sec with rollator walker to indicate lower fall risk.     Time  8    Period  Weeks    Status  New    Target Date  04/11/19      PT LONG TERM GOAL #4   Title  Patient ambulates 400' with rollator walker modified independent.    Time  8    Period  Weeks    Status  New    Target Date  04/11/19      PT LONG TERM GOAL #5   Title  Patient negotiates ramps & curbs with rollator walker and stairs with single rail similar to home modified independent.     Time  8    Period  Weeks    Status  New    Target Date  04/11/19            Plan - 02/25/19 1109    Clinical Impression Statement  Today's skilled session focused on review of HEP issued to date and addition of 3 new OTAGO ex's to pt's home program. Also continued to address rollator safety with gait during session. The pt is progressing toward goals and should benefit from continued PT to progress toward unmet goals.    Rehab Potential  Good    PT Frequency  2x / week    PT Duration  8 weeks    PT Treatment/Interventions  ADLs/Self Care Home Management;Cryotherapy;Moist Heat;Ultrasound;DME Instruction;Gait training;Stair training;Functional mobility training;Therapeutic activities;Therapeutic exercise;Balance training;Neuromuscular re-education;Patient/family education;Dry needling;Vestibular    PT  Next Visit Plan  continue to work on gait with rollator, balance reactions and LE strengthening    PT Home Exercise Plan  Access Code: [email or web address]    Consulted and Agree with Plan of Care  Patient       Patient will benefit from skilled therapeutic intervention in order to improve the following deficits and impairments:  Abnormal gait, Decreased activity tolerance, Decreased balance, Decreased coordination, Decreased endurance, Decreased mobility, Decreased strength, Dizziness, Impaired flexibility, Postural dysfunction, Obesity, Pain  Visit Diagnosis: Unsteadiness on feet  Other abnormalities of gait and mobility  Muscle weakness (generalized)     Problem List Patient Active Problem List   Diagnosis Date Noted  . Hot flashes 12/11/2018  . 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 Abnormal echocardiogram   . Stroke (cerebrum) ([place]) 10/13/2018  . Frequent PVCs   . Uncontrolled type 1 diabetes mellitus ([place])   . BPPV (benign paroxysmal positional vertigo), right 06/13/2017  . Premature atrial beats 06/13/2017  .  Snoring 07/31/2016  . Pre-ulcerative corn or callous 12/15/2015  . History of CHF (congestive heart failure) 10/29/2015  . Essential hypertension 10/29/2015  . Cerebrovascular accident (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 ([place]) 10/29/2015  . Thyroid activity decreased   . Intracranial vascular stenosis   . HLD (hyperlipidemia)   . Cerebral infarction due to thrombosis of basilar artery ([place])   . CVA (cerebral vascular accident) ([place]) 08/29/2015  . Posterior tibial tendinitis of right leg 04/26/2014  . Atypical chest pain 10/06/2013  . Morbid obesity ([place]) 05/29/2013  . Back pain without radiation 04/28/2013  . Nail dystrophy 01/10/2013  . Diabetes mellitus ([place]) 01/25/2012  . Hyperlipidemia 01/25/2012  . Hypothyroidism 01/25/2012  . Hypertension 01/25/2012  . GERD (gastroesophageal reflux disease) 01/25/2012    [name], PTA, [facility] [address] [place],  [address] [phone] 02/25/19, 10:41 PM   Name: [name] MRN: [id number] Date of Birth: January 21, 1951

## 2019-02-25 NOTE — Progress Notes (Deleted)
  Subjective:  Patient ID: [name]  DOB: 01-09-1951 MRN: [id number]  Kalana Yust Ottie Neglia is a 68 y.o. female with a PMH of ***, here today for ***.   HPI:  ***  ***ROS: All other systems otherwise negative, except as mentioned in HPI  Family hx: ***  Social hx: ***Denies use of illicit drugs, alcohol use Smoking status reviewed  Patient Active Problem List   Diagnosis Date Noted  . Hot flashes 12/11/2018  . 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 Abnormal echocardiogram   . Stroke (cerebrum) ([place]) 10/13/2018  . Frequent PVCs   . Uncontrolled type 1 diabetes mellitus ([place])   . BPPV (benign paroxysmal positional vertigo), right 06/13/2017  . Premature atrial beats 06/13/2017  . Snoring 07/31/2016  . Pre-ulcerative corn or callous 12/15/2015  . History of CHF (congestive heart failure) 10/29/2015  . Essential hypertension 10/29/2015  . Cerebrovascular accident (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 ([place]) 10/29/2015  . Thyroid activity decreased   . Intracranial vascular stenosis   . HLD (hyperlipidemia)   . 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 CVA (cerebral vascular accident) ([place]) 08/29/2015  . Posterior tibial tendinitis of right leg 04/26/2014  . Atypical chest pain 10/06/2013  . Morbid obesity ([place]) 05/29/2013  . Back pain without radiation 04/28/2013  . Nail dystrophy 01/10/2013  . Diabetes mellitus ([place]) 01/25/2012  . Hyperlipidemia 01/25/2012  . Hypothyroidism 01/25/2012  . Hypertension 01/25/2012  . GERD (gastroesophageal reflux disease) 01/25/2012     Objective:  There were no vitals taken for this visit.  Vitals and nursing note reviewed  General: NAD, pleasant Cardiac: RRR, normal heart sounds, no m/r/g Pulm: normal effort, CTAB ***GI: soft, nontender, nondistended Extremities: no edema or cyanosis. WWP. Skin: warm and dry, no rashes noted Neuro: alert and oriented, no focal deficits Psych:  normal affect, normal thought content  Assessment & Plan:   No problem-specific Assessment & Plan notes found for this encounter.   [name] Suetta Hoffmeister, DO Family Medicine Resident PGY-2

## 2019-02-26 ENCOUNTER — Encounter: Payer: Self-pay | Admitting: Family Medicine

## 2019-02-27 ENCOUNTER — Ambulatory Visit (INDEPENDENT_AMBULATORY_CARE_PROVIDER_SITE_OTHER): Payer: Medicare Other | Admitting: *Deleted

## 2019-02-27 ENCOUNTER — Encounter: Payer: Self-pay | Admitting: Physical Therapy

## 2019-02-27 ENCOUNTER — Ambulatory Visit: Payer: Medicare Other | Admitting: Physical Therapy

## 2019-02-27 DIAGNOSIS — I639 Cerebral infarction, unspecified: Secondary | ICD-10-CM

## 2019-02-27 DIAGNOSIS — R2689 Other abnormalities of gait and mobility: Secondary | ICD-10-CM

## 2019-02-27 DIAGNOSIS — R2681 Unsteadiness on feet: Secondary | ICD-10-CM | POA: Diagnosis not present

## 2019-02-27 DIAGNOSIS — M6281 Muscle weakness (generalized): Secondary | ICD-10-CM

## 2019-02-27 NOTE — Therapy (Signed)
[facility] [address] [place], [place], [address] Phone: [phone]   Fax:  [phone]  Physical Therapy Treatment  Patient Details  Name: Marlyss Cissell MRN: [id number] Date of Birth: [birth year] Referring Provider (PT): Andrena Mews, MD   Encounter Date: 02/27/2019  PT End of Session - 02/27/19 1021    Visit Number  5    Number of Visits  17    Authorization Type  UHC Medicare    PT Start Time  2703    PT Stop Time  1057    PT Time Calculation (min)  39 min    Equipment Utilized During Treatment  Gait belt    Activity Tolerance  Patient tolerated treatment well;Patient limited by fatigue    Behavior During Therapy  [facility] for tasks assessed/performed       Past Medical History:  Diagnosis Date  . Anemia   . Arthritis   . CHF (congestive heart failure) ([place])   . Depression   . GERD (gastroesophageal reflux disease)   . Hemorrhoids   . Hyperlipidemia   . Hypertension   . Hypothyroidism   . Seasonal allergies   . Stroke ([place])   . Thyroid cancer ([place])    per pt S/p Total Thyroidectomy with Radioactive Iodine Therapy  . Type 1 diabetes mellitus ([place])     Past Surgical History:  Procedure Laterality Date  . ABDOMINAL ANGIOGRAM N/A 05/14/2012   Procedure: ABDOMINAL ANGIOGRAM;  Surgeon: Laverda Page, MD;  Location: [facility] CATH LAB;  Service: Cardiovascular;  Laterality: N/A;  . ABDOMINAL HYSTERECTOMY  1995   partial  . ANKLE FRACTURE SURGERY Right   . CARDIAC CATHETERIZATION     with coronary angiogram  . COLONOSCOPY N/A 09/09/2014   Procedure: COLONOSCOPY;  Surgeon: Gatha Mayer, MD;  Location: [facility];  Service: Endoscopy;  Laterality: N/A;  . ESOPHAGOGASTRODUODENOSCOPY (EGD) WITH PROPOFOL N/A 12/03/2018   Procedure: ESOPHAGOGASTRODUODENOSCOPY (EGD) WITH PROPOFOL;  Surgeon: Doran Stabler, MD;  Location: [facility];  Service: Gastroenterology;  Laterality: N/A;  . LEFT AND RIGHT  HEART CATHETERIZATION WITH CORONARY ANGIOGRAM N/A 05/14/2012   Procedure: LEFT AND RIGHT HEART CATHETERIZATION WITH CORONARY ANGIOGRAM;  Surgeon: Laverda Page, MD;  Location: [facility] C[facility] LAB;  Service: Cardiovascular;  Laterality: N/A;  . LOOP RECORDER INSERTION N/A 12/23/2018   Procedure: LOOP RECORDER INSERTION;  Surgeon: Constance Haw, MD;  Location: [place] CV LAB;  Service: Cardiovascular;  Laterality: N/A;  . MINI METER   09/08/2014   ELECTRONIC INSULIN PUMP  . TEE WITHOUT CARDIOVERSION N/A 10/16/2018   Procedure: TRANSESOPHAGEAL ECHOCARDIOGRAM (TEE);  Surgeon: Buford Dresser, MD;  Location: [facility] ENDOSCOPY;  Service: Cardiovascular;  Laterality: N/A;  . THYROIDECTOMY  2006    There were no vitals filed for this visit.  Subjective Assessment - 02/27/19 1020    Subjective  Has been doing the HEP, including the new ones, without any issues. No falls or pain to report.     Pertinent History  CVA 2016, HTN, thyroid CA, arthritis, CHF, depression, DM1, A-fib    Limitations  Lifting;Standing;Walking;House hold activities    Patient Stated Goals  To improve left side strength, walking & balance    Currently in Pain?  No/denies    Pain Score  0-No pain              [facility] Adult PT Treatment/Exercise - 02/27/19 1022      Transfers   Transfers  Sit to Stand;Stand  to Sit    Sit to Stand  5: Supervision;With upper extremity assist;With armrests;From chair/3-in-1    Stand to Sit  5: Supervision;With upper extremity assist;With armrests;To chair/3-in-1      Ambulation/Gait   Ambulation/Gait  Yes    Ambulation/Gait Assistance  5: Supervision    Ambulation/Gait Assistance Details  continued to cue for posture and rollator position with gait    Ambulation Distance (Feet)  80 Feet   x2, plus around gym with activity   Assistive device  Rollator    Gait Pattern  Step-through pattern;Decreased stance time - left;Decreased step length - right;Decreased weight shift to  left;Antalgic;Trunk flexed;Wide base of support    Ambulation Surface  Level;Indoor      High Level Balance   High Level Balance Activities  Side stepping;Marching forwards;Marching backwards;Tandem walking   tandem gait fwd/bwd   High Level Balance Comments  in parallel bars with light UE support on bars: 3 laps each with min guard to min assist for balance. cues on posture, ex technique and weight shifting.       Knee/Hip Exercises: Aerobic   Other Aerobic  Scifit UE/LE's level 1.5 for 8 minutes with goal >/= 35 rpm for strengthening and activity tolerance.           Balance Exercises - 02/27/19 1051      Balance Exercises: Standing   Standing Eyes Closed  Wide (BOA);Head turns;Foam/compliant surface;Other reps (comment);30 secs;Limitations    Balance Beam  standing with feet across red beam with bil light UE support on bars: fwd stepping to floor/back onto beam for 10 reps each leg, min guard assist with cues for incr weight shifitng with stepping.     Other Standing Exercises  on airex in parallel bars with varied UE support: alternating UE raises, progressing to bil UE raises with min guard assist; with light UE support- heel/toe raises for 10 reps with cues on posture and increased lifting.        Balance Exercises: Standing   Standing Eyes Closed Limitations  on airex in parallel bars with light UE support: EC no head movements, progressing to EC head movements left<>right, then up<>down with cues on posture, to keep head up and for weight shifting to assist with balance.                              PT Short Term Goals - 02/11/19 1800      PT SHORT TERM GOAL #1   Title  Patient verbalizes & demonstrates understanding of initial HEP. (All STGs Target Date: 03/14/2019)    Time  4    Period  Weeks    Status  New    Target Date  03/14/19      PT SHORT TERM GOAL #2   Title  Patient verbalizes understanding of initial community based fitness activities like [employer].     Time   4    Period  Weeks    Status  New    Target Date  03/14/19      PT SHORT TERM GOAL #3   Title  Berg Balance >/= 36/56    Time  4    Period  Weeks    Status  New    Target Date  03/14/19      PT SHORT TERM GOAL #4   Title  Patient ambulates 250' with rollator walker with supervision.     Time  4  Period  Weeks    Status  New    Target Date  03/14/19        PT Long Term Goals - 02/11/19 1800      PT LONG TERM GOAL #1   Title  Patient verbalizes & demonstrates understanding of HEP & Ongoing fitness plan. (All LTGs Target Date: 04/11/2019)    Time  8    Period  Weeks    Status  New    Target Date  04/11/19      PT LONG TERM GOAL #2   Title  Berg Balance >/= 40/56 to indicate lower fall risk.     Time  8    Period  Weeks    Status  New    Target Date  04/11/19      PT LONG TERM GOAL #3   Title  Timed Up & Go <13.5sec with rollator walker to indicate lower fall risk.     Time  8    Period  Weeks    Status  New    Target Date  04/11/19      PT LONG TERM GOAL #4   Title  Patient ambulates 400' with rollator walker modified independent.    Time  8    Period  Weeks    Status  New    Target Date  04/11/19      PT LONG TERM GOAL #5   Title  Patient negotiates ramps & curbs with rollator walker and stairs with single rail similar to home modified independent.     Time  8    Period  Weeks    Status  New    Target Date  04/11/19            Plan - 02/27/19 1021    Clinical Impression Statement  Today's skilled session focused on LE strengthening, activity tolerance and balance reactions. Fatigued with activity, needing seated rest breaks throughout session. The pt is progressing toward goals and should benefit from continued PT to progress toward unmet goals.     Rehab Potential  Good    PT Frequency  2x / week    PT Duration  8 weeks    PT Treatment/Interventions  ADLs/Self Care Home Management;Cryotherapy;Moist Heat;Ultrasound;DME Instruction;Gait  training;Stair training;Functional mobility training;Therapeutic activities;Therapeutic exercise;Balance training;Neuromuscular re-education;Patient/family education;Dry needling;Vestibular    PT Next Visit Plan  continue to work on gait with rollator, balance reactions and LE strengthening    PT Home Exercise Plan  Access Code: [email or web address]    Consulted and Agree with Plan of Care  Patient       Patient will benefit from skilled therapeutic intervention in order to improve the following deficits and impairments:  Abnormal gait, Decreased activity tolerance, Decreased balance, Decreased coordination, Decreased endurance, Decreased mobility, Decreased strength, Dizziness, Impaired flexibility, Postural dysfunction, Obesity, Pain  Visit Diagnosis: Unsteadiness on feet  Other abnormalities of gait and mobility  Muscle weakness (generalized)     Problem List Patient Active Problem List   Diagnosis Date Noted  . Hot flashes 12/11/2018  . 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 Abnormal echocardiogram   . Stroke (cerebrum) ([place]) 10/13/2018  . Frequent PVCs   . Uncontrolled type 1 diabetes mellitus ([place])   . BPPV (benign paroxysmal positional vertigo), right 06/13/2017  . Premature atrial beats 06/13/2017  . Snoring 07/31/2016  . Pre-ulcerative corn or callous 12/15/2015  . History of CHF (congestive heart failure) 10/29/2015  . Essential hypertension 10/29/2015  .  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 Thyroid activity decreased   . Intracranial vascular stenosis   . HLD (hyperlipidemia)   . 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 CVA (cerebral vascular accident) ([place]) 08/29/2015  . Posterior tibial tendinitis of right leg 04/26/2014  . Atypical chest pain 10/06/2013  . Morbid obesity ([place]) 05/29/2013  . Back pain without radiation 04/28/2013  . Nail dystrophy 01/10/2013  . Diabetes mellitus ([place])  01/25/2012  . Hyperlipidemia 01/25/2012  . Hypothyroidism 01/25/2012  . Hypertension 01/25/2012  . GERD (gastroesophageal reflux disease) 01/25/2012    [name], PTA, [facility] Neuro [facility] [address], [place], [place] [address] [phone] 02/27/19, 12:23 PM   Name: [name] MRN: [id number] Date of Birth: 02/25/1951

## 2019-03-02 LAB — CUP PACEART REMOTE DEVICE CHECK
Date Time Interrogation Session: 20200305154251
Implantable Pulse Generator Implant Date: 20191230

## 2019-03-03 ENCOUNTER — Ambulatory Visit: Payer: Medicare Other | Admitting: Physical Therapy

## 2019-03-03 ENCOUNTER — Encounter: Payer: Self-pay | Admitting: Physical Therapy

## 2019-03-03 DIAGNOSIS — R42 Dizziness and giddiness: Secondary | ICD-10-CM

## 2019-03-03 DIAGNOSIS — R2689 Other abnormalities of gait and mobility: Secondary | ICD-10-CM

## 2019-03-03 DIAGNOSIS — R293 Abnormal posture: Secondary | ICD-10-CM

## 2019-03-03 DIAGNOSIS — M6281 Muscle weakness (generalized): Secondary | ICD-10-CM

## 2019-03-03 DIAGNOSIS — R2681 Unsteadiness on feet: Secondary | ICD-10-CM

## 2019-03-03 NOTE — Therapy (Signed)
[facility] [address] [place], [place], [address] Phone: [phone]   Fax:  [phone]  Physical Therapy Treatment  Patient Details  Name: [name] MRN: [id number] Date of Birth: [birth year] Referring Provider (PT): Andrena Mews, MD   Encounter Date: 03/03/2019  PT End of Session - 03/03/19 0937    Visit Number  6    Number of Visits  17    Authorization Type  UHC Medicare    PT Start Time  0935    PT Stop Time  1015    PT Time Calculation (min)  40 min    Equipment Utilized During Treatment  Gait belt    Activity Tolerance  Patient tolerated treatment well;Patient limited by fatigue    Behavior During Therapy  [facility] for tasks assessed/performed       Past Medical History:  Diagnosis Date  . Anemia   . Arthritis   . CHF (congestive heart failure) ([place])   . Depression   . GERD (gastroesophageal reflux disease)   . Hemorrhoids   . Hyperlipidemia   . Hypertension   . Hypothyroidism   . Seasonal allergies   . Stroke ([place])   . Thyroid cancer ([place])    per pt S/p Total Thyroidectomy with Radioactive Iodine Therapy  . Type 1 diabetes mellitus ([place])     Past Surgical History:  Procedure Laterality Date  . ABDOMINAL ANGIOGRAM N/A 05/14/2012   Procedure: ABDOMINAL ANGIOGRAM;  Surgeon: Laverda Page, MD;  Location: [facility] CATH LAB;  Service: Cardiovascular;  Laterality: N/A;  . ABDOMINAL HYSTERECTOMY  1995   partial  . ANKLE FRACTURE SURGERY Right   . CARDIAC CATHETERIZATION     with coronary angiogram  . COLONOSCOPY N/A 09/09/2014   Procedure: COLONOSCOPY;  Surgeon: Gatha Mayer, MD;  Location: [place];  Service: Endoscopy;  Laterality: N/A;  . ESOPHAGOGASTRODUODENOSCOPY (EGD) WITH PROPOFOL N/A 12/03/2018   Procedure: ESOPHAGOGASTRODUODENOSCOPY (EGD) WITH PROPOFOL;  Surgeon: Doran Stabler, MD;  Location: [facility] [facility];  Service: Gastroenterology;  Laterality: N/A;  . LEFT AND RIGHT  HEART CATHETERIZATION WITH CORONARY ANGIOGRAM N/A 05/14/2012   Procedure: LEFT AND RIGHT HEART CATHETERIZATION WITH CORONARY ANGIOGRAM;  Surgeon: Laverda Page, MD;  Location: [facility] CATH LAB;  Service: Cardiovascular;  Laterality: N/A;  . LOOP RECORDER INSERTION N/A 12/23/2018   Procedure: LOOP RECORDER INSERTION;  Surgeon: Constance Haw, MD;  Location: [place] CV LAB;  Service: Cardiovascular;  Laterality: N/A;  . MINI METER   09/08/2014   ELECTRONIC INSULIN PUMP  . TEE WITHOUT CARDIOVERSION N/A 10/16/2018   Procedure: TRANSESOPHAGEAL ECHOCARDIOGRAM (TEE);  Surgeon: Buford Dresser, MD;  Location: [facility] ENDOSCOPY;  Service: Cardiovascular;  Laterality: N/A;  . THYROIDECTOMY  2006    There were no vitals filed for this visit.  Subjective Assessment - 03/03/19 0937    Subjective  No new complaints. No falls or pain to report.     Pertinent History  CVA 2016, HTN, thyroid CA, arthritis, CHF, depression, DM1, A-fib    Limitations  Lifting;Standing;Walking;House hold activities    Patient Stated Goals  To improve left side strength, walking & balance    Currently in Pain?  No/denies    Pain Score  0-No pain           [facility] Adult PT Treatment/Exercise - 03/03/19 0938      Transfers   Transfers  Sit to Stand;Stand to Sit    Sit to Stand  5: Supervision;With upper  extremity assist;With armrests;From chair/3-in-1    Stand to Sit  5: Supervision;With upper extremity assist;With armrests;To chair/3-in-1      Ambulation/Gait   Ambulation/Gait  Yes    Ambulation/Gait Assistance  5: Supervision    Ambulation/Gait Assistance Details  cues for posture, incr stride length and rollator position with gait.     Ambulation Distance (Feet)  230 Feet   x1, plus around gym with activity   Assistive device  Rollator    Gait Pattern  Step-through pattern;Decreased stance time - left;Decreased step length - right;Decreased weight shift to left;Antalgic;Trunk flexed;Wide base of support     Ambulation Surface  Level;Indoor      Knee/Hip Exercises: Aerobic   Other Aerobic  Scifit UE/LE's level 1.7 for 8 minutes with goal >/= 35 rpm for strengthening and activity tolerance.           Balance Exercises - 03/03/19 1008      Balance Exercises: Standing   SLS with Vectors  Solid surface;Upper extremity assist 1;Other reps (comment);Limitations    Rockerboard  Anterior/posterior;Lateral;Head turns;EO;EC;30 seconds;10 reps;Intermittent UE support      Balance Exercises: Standing   SLS with Vectors Limitations  2 foam bubbles on floor with single UE support, min guard to min assist for balance: alternating fwd toe taps, alternating cross toe taps, and alternating fwd double toe taps x 10 reps each. cues on stance position, weight shifting and to slow down for improved balance.      Rebounder Limitations  performed both ways on balance board with UE support progressing to light touch with bil UE's on bars: rocking the board with EO, emphasis on tall posture; then holding the board steady- EC no head movements, progressing to EC head movements left<>right, then up <>down. min guard to min assist for balance with cues on posture/weight shifing for balance assistance.            PT Short Term Goals - 02/11/19 1800      PT SHORT TERM GOAL #1   Title  Patient verbalizes & demonstrates understanding of initial HEP. (All STGs Target Date: 03/14/2019)    Time  4    Period  Weeks    Status  New    Target Date  03/14/19      PT SHORT TERM GOAL #2   Title  Patient verbalizes understanding of initial community based fitness activities like [employer].     Time  4    Period  Weeks    Status  New    Target Date  03/14/19      PT SHORT TERM GOAL #3   Title  Berg Balance >/= 36/56    Time  4    Period  Weeks    Status  New    Target Date  03/14/19      PT SHORT TERM GOAL #4   Title  Patient ambulates 250' with rollator walker with supervision.     Time  4    Period  Weeks     Status  New    Target Date  03/14/19        PT Long Term Goals - 02/11/19 1800      PT LONG TERM GOAL #1   Title  Patient verbalizes & demonstrates understanding of HEP & Ongoing fitness plan. (All LTGs Target Date: 04/11/2019)    Time  8    Period  Weeks    Status  New    Target Date  04/11/19  PT LONG TERM GOAL #2   Title  Berg Balance >/= 40/56 to indicate lower fall risk.     Time  8    Period  Weeks    Status  New    Target Date  04/11/19      PT LONG TERM GOAL #3   Title  Timed Up & Go <13.5sec with rollator walker to indicate lower fall risk.     Time  8    Period  Weeks    Status  New    Target Date  04/11/19      PT LONG TERM GOAL #4   Title  Patient ambulates 400' with rollator walker modified independent.    Time  8    Period  Weeks    Status  New    Target Date  04/11/19      PT LONG TERM GOAL #5   Title  Patient negotiates ramps & curbs with rollator walker and stairs with single rail similar to home modified independent.     Time  8    Period  Weeks    Status  New    Target Date  04/11/19            Plan - 03/03/19 9485    Clinical Impression Statement  Today's skilled session continued to focus on LE strengthening, balance reactions and gait with rollator with only fatigue reported. Short rest breaks during session required due to fatigue. The pt is progressing toward goals and should benefit from continued PT to progress toward unmet goals.    Rehab Potential  Good    PT Frequency  2x / week    PT Duration  8 weeks    PT Treatment/Interventions  ADLs/Self Care Home Management;Cryotherapy;Moist Heat;Ultrasound;DME Instruction;Gait training;Stair training;Functional mobility training;Therapeutic activities;Therapeutic exercise;Balance training;Neuromuscular re-education;Patient/family education;Dry needling;Vestibular    PT Next Visit Plan  continue to work on gait with rollator, balance reactions and LE strengthening    PT Home Exercise Plan   Access Code: [email or web address]    Consulted and Agree with Plan of Care  Patient       Patient will benefit from skilled therapeutic intervention in order to improve the following deficits and impairments:  Abnormal gait, Decreased activity tolerance, Decreased balance, Decreased coordination, Decreased endurance, Decreased mobility, Decreased strength, Dizziness, Impaired flexibility, Postural dysfunction, Obesity, Pain  Visit Diagnosis: Unsteadiness on feet  Other abnormalities of gait and mobility  Muscle weakness (generalized)  Abnormal posture  Dizziness and giddiness     Problem List Patient Active Problem List   Diagnosis Date Noted  . Hot flashes 12/11/2018  . 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 Abnormal echocardiogram   . Stroke (cerebrum) ([place]) 10/13/2018  . Frequent PVCs   . Uncontrolled type 1 diabetes mellitus ([place])   . BPPV (benign paroxysmal positional vertigo), right 06/13/2017  . Premature atrial beats 06/13/2017  . Snoring 07/31/2016  . Pre-ulcerative corn or callous 12/15/2015  . History of CHF (congestive heart failure) 10/29/2015  . Essential hypertension 10/29/2015  . Cerebrovascular accident (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 ([place]) 10/29/2015  . Thyroid activity decreased   . Intracranial vascular stenosis   . HLD (hyperlipidemia)   . Cerebral infarction due to thrombosis of basilar artery ([place])   . CVA (cerebral vascular accident) ([place]) 08/29/2015  . Posterior tibial tendinitis of right leg 04/26/2014  . Atypical chest pain 10/06/2013  . Morbid obesity ([place]) 05/29/2013  . Back pain without radiation 04/28/2013  .  Nail dystrophy 01/10/2013  . Diabetes mellitus ([place]) 01/25/2012  . Hyperlipidemia 01/25/2012  . Hypothyroidism 01/25/2012  . Hypertension 01/25/2012  . GERD (gastroesophageal reflux disease) 01/25/2012    [name], PTA, [facility] Outpatient Neuro [facility] [address] [place], [place]  [address] [phone] 03/03/19, 2:45 PM   Name: Leyah Bocchino MRN: [id number] Date of Birth: Dec 08, 1951

## 2019-03-05 ENCOUNTER — Encounter: Payer: Self-pay | Admitting: Physical Therapy

## 2019-03-05 ENCOUNTER — Ambulatory Visit: Payer: Medicare Other | Admitting: Physical Therapy

## 2019-03-05 DIAGNOSIS — R2689 Other abnormalities of gait and mobility: Secondary | ICD-10-CM

## 2019-03-05 DIAGNOSIS — R42 Dizziness and giddiness: Secondary | ICD-10-CM

## 2019-03-05 DIAGNOSIS — M6281 Muscle weakness (generalized): Secondary | ICD-10-CM

## 2019-03-05 DIAGNOSIS — R2681 Unsteadiness on feet: Secondary | ICD-10-CM | POA: Diagnosis not present

## 2019-03-06 ENCOUNTER — Ambulatory Visit: Payer: Medicare Other | Admitting: Neurology

## 2019-03-06 ENCOUNTER — Other Ambulatory Visit: Payer: Self-pay

## 2019-03-06 ENCOUNTER — Encounter: Payer: Self-pay | Admitting: Neurology

## 2019-03-06 VITALS — BP 148/92 | HR 62 | Ht 67.0 in | Wt 263.0 lb

## 2019-03-06 DIAGNOSIS — I4819 Other persistent atrial fibrillation: Secondary | ICD-10-CM

## 2019-03-06 DIAGNOSIS — R351 Nocturia: Secondary | ICD-10-CM | POA: Diagnosis not present

## 2019-03-06 DIAGNOSIS — Z8673 Personal history of transient ischemic attack (TIA), and cerebral infarction without residual deficits: Secondary | ICD-10-CM | POA: Diagnosis not present

## 2019-03-06 DIAGNOSIS — Z6841 Body Mass Index (BMI) 40.0 and over, adult: Secondary | ICD-10-CM

## 2019-03-06 DIAGNOSIS — R0683 Snoring: Secondary | ICD-10-CM

## 2019-03-06 DIAGNOSIS — G4719 Other hypersomnia: Secondary | ICD-10-CM

## 2019-03-06 NOTE — Progress Notes (Signed)
Subjective:    Patient ID: [name] is a 68 y.o. female.  HPI     Star Age, MD, PhD [facility] [address], [address] P.O. [place], [place] [address]  Dear [name],  I saw your patient, [name], upon your kind request in my sleep clinic today for initial dictation of her sleep disorder, in particular, concern for underlying obstructive sleep apnea. The patient is unaccompanied today. As you know, [name] is a 68 year old right-handed woman with an underlying medical history of hyperlipidemia, hypertension, type 2 diabetes, paroxysmal A. fib, stroke, CHF, depression, anemia, arthritis, allergies, thyroid cancer with status post thyroidectomy and radioiodine therapy, and morbid obesity with a BMI of over 40, who reports snoring and excessive daytime somnolence. I reviewed your office note from 02/19/2019. Her sister lives with her. Pt has a 21 year old biological son, she has a 70 year old adopted daughter. 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 She is a nonsmoker and does not utilize alcohol, does not currently utilize caffeine. She has some residual left-sided weakness, is in physical therapy at neuro rehabilitation, has pending occupational therapy. She has not fallen, she uses a rolling walker. She's currently not driving. Her Epworth sleepiness score is 15 out of 24 today, fatigue score is 20 out of 63. Her snoring can be quite loud. She has nocturia about twice per average night, she denies morning headaches. She has had some shortness of breath when lying down. She has a loop recorder in place. Her bedtime can be as early as 7, rise time around 5.  Her Past Medical History Is Significant For: Past Medical History:  Diagnosis Date  . Anemia   . Arthritis   . CHF (congestive heart failure) ([place])   . Depression   . GERD (gastroesophageal reflux disease)   . Hemorrhoids   .  Hyperlipidemia   . Hypertension   . Hypothyroidism   . Seasonal allergies   . Stroke ([place])   . Thyroid cancer ([place])    per pt S/p Total Thyroidectomy with Radioactive Iodine Therapy  . Type 1 diabetes mellitus ([place])     Her Past Surgical History Is Significant For: Past Surgical History:  Procedure Laterality Date  . ABDOMINAL ANGIOGRAM N/A 05/14/2012   Procedure: ABDOMINAL ANGIOGRAM;  Surgeon: Laverda Page, MD;  Location: [facility] CATH LAB;  Service: Cardiovascular;  Laterality: N/A;  . ABDOMINAL HYSTERECTOMY  1995   partial  . ANKLE FRACTURE SURGERY Right   . CARDIAC CATHETERIZATION     with coronary angiogram  . COLONOSCOPY N/A 09/09/2014   Procedure: COLONOSCOPY;  Surgeon: Gatha Mayer, MD;  Location: [place];  Service: Endoscopy;  Laterality: N/A;  . ESOPHAGOGASTRODUODENOSCOPY (EGD) WITH PROPOFOL N/A 12/03/2018   Procedure: ESOPHAGOGASTRODUODENOSCOPY (EGD) WITH PROPOFOL;  Surgeon: Doran Stabler, MD;  Location: [place];  Service: Gastroenterology;  Laterality: N/A;  . LEFT AND RIGHT HEART CATHETERIZATION WITH CORONARY ANGIOGRAM N/A 05/14/2012   Procedure: LEFT AND RIGHT HEART CATHETERIZATION WITH CORONARY ANGIOGRAM;  Surgeon: Laverda Page, MD;  Location[facility] CATH LAB;  Service: Cardiovascular;  Laterality: N/A;  . LOOP RECORDER INSERTION N/A 12/23/2018   Procedure: LOOP RECORDER INSERTION;  Surgeon: Constance Haw, MD;  Location: [place] CV LAB;  Service: Cardiovascular;  Laterality: N/A;  . MINI METER   09/08/2014   ELECTRONIC INSULIN PUMP  . TEE WITHOUT CARDIOVERSION N/A 10/16/2018   Procedure: TRANSESOPHAGEAL ECHOCARDIOGRAM (TEE);  Surgeon: Buford Dresser, MD;  Location: [facility] ENDOSCOPY;  Service: Cardiovascular;  Laterality: N/A;  . THYROIDECTOMY  2006    Her Family History Is Significant For: Family History  Problem Relation Age of Onset  . Heart disease Mother   . Hyperlipidemia Mother   . Hypertension Mother   . Renal Disease Mother         insufficiency  . Heart attack Mother        in his 94's  . Early death Father        Head Injury at Work  . Hyperlipidemia Father   . Hypertension Father   . Stroke Son   . Diabetes Maternal Aunt   . Prostate cancer Paternal Uncle   . Heart disease Maternal Aunt   . Heart failure Maternal Grandfather   . Heart disease Maternal Grandfather   . Heart attack Maternal Grandfather        Died in his 45's  . Breast cancer Neg Hx     Her Social History Is Significant For: Social History   Socioeconomic History  . Marital status: Divorced    Spouse name: Not on file  . Number of children: 1  . Years of education: Not on file  . Highest education level: Not on file  Occupational History  . Occupation: retired Tour manager  . Financial resource strain: Not on file  . Food insecurity:    Worry: Not on file    Inability: Not on file  . Transportation needs:    Medical: Not on file    Non-medical: Not on file  Tobacco Use  . Smoking status: Never Smoker  . Smokeless tobacco: Never Used  Substance and Sexual Activity  . Alcohol use: No  . Drug use: No  . Sexual activity: Not on file  Lifestyle  . Physical activity:    Days per week: Not on file    Minutes per session: Not on file  . Stress: Not on file  Relationships  . Social connections:    Talks on phone: Not on file    Gets together: Not on file    Attends religious service: Not on file    Active member of club or organization: Not on file    Attends meetings of clubs or organizations: Not on file    Relationship status: Not on file  Other Topics Concern  . Not on file  Social History Narrative   Retired Pharmacist, hospital, high school teacher taught mass. One son, helping to care for her granddaughter. No caffeine. Updated 07/20/2014.    Her Allergies Are:  Allergies  Allergen Reactions  . Latex Rash  :   Her Current Medications Are:  Outpatient Encounter Medications as of 03/06/2019  Medication Sig   . acetaminophen (TYLENOL) 500 MG tablet Take 1,000 mg by mouth as needed for mild pain.  Marland Kitchen apixaban (ELIQUIS) 5 MG TABS tablet Take 1 tablet (5 mg total) by mouth 2 (two) times daily.  Marland Kitchen atorvastatin (LIPITOR) 80 MG tablet Take 1 tablet (80 mg total) by mouth daily.  Marland Kitchen CRANBERRY EXTRACT PO Take 1 capsule by mouth daily.  . diclofenac sodium (VOLTAREN) 1 % GEL Apply 4 g topically 4 (four) times daily.  . fexofenadine (ALLEGRA) 30 MG tablet Take 30 mg by mouth daily as needed (allergies).   Marland Kitchen FLUoxetine (PROZAC) 10 MG capsule Take 1 capsule (10 mg total) by mouth daily.  Marland Kitchen FLUoxetine (PROZAC) 20 MG capsule Take 20 mg by mouth daily.  Marland Kitchen  gabapentin (NEURONTIN) 300 MG capsule Take 1 capsule (300 mg total) by mouth at bedtime.  . Insulin Human (INSULIN PUMP) SOLN Inject into the skin continuous. humalog insulin  . levothyroxine (SYNTHROID, LEVOTHROID) 175 MCG tablet Take 175 mcg by mouth daily before breakfast.   . losartan (COZAAR) 100 MG tablet Take 1 tablet (100 mg total) by mouth daily.  . metoprolol succinate (TOPROL-XL) 50 MG 24 hr tablet Take 1 and 1/2 tablets by mouth daily  . pantoprazole (PROTONIX) 40 MG tablet Take 1 tablet (40 mg total) by mouth daily.   No facility-administered encounter medications on file as of 03/06/2019.   :  Review of Systems:  Out of a complete 14 point review of systems, all are reviewed and negative with the exception of these symptoms as listed below: Review of Systems  Neurological:       Pt presents today to discuss her sleep. Pt has never had a sleep study but does endorse snoring.  Epworth Sleepiness Scale 0= would never doze 1= slight chance of dozing 2= moderate chance of dozing 3= high chance of dozing  Sitting and reading: 3 Watching TV: 3 Sitting inactive in a public place (ex. Theater or meeting): 1 As a passenger in a car for an hour without a break: 2 Lying down to rest in the afternoon: 2 Sitting and talking to someone: 0 Sitting  quietly after lunch (no alcohol): 2 In a car, while stopped in traffic: 2 Total: 15     Objective:  Neurological Exam  Physical Exam Physical Examination:   Vitals:   03/06/19 1023  BP: (!) 148/92  Pulse: 62   General Examination: The patient is a very pleasant 68 y.o. female in no acute distress. She appears well-developed and well-nourished and well groomed.   HEENT: Normocephalic, atraumatic, pupils are equal, round and reactive to light. Extraocular tracking is preserved. Speech is clear, no dysarthria noted. Face is symmetric, hearing is intact. Airway examination reveals mild to moderate mouth dryness, adequate dental hygiene with full denture on top and partially edentulous on the bottom. She has a moderate airway crowding secondary to smaller airway entry, uvula is wider, tonsils about 3+ bilaterally, Mallampati is class III. Neck circumference is 16-1/2 inches. She has a mild underbite.  Chest: Clear to auscultation without wheezing, rhonchi or crackles noted.  Heart: S1+S2+0, regular and normal without murmurs, rubs or gallops noted.   Abdomen: Soft, non-tender and non-distended with normal bowel sounds appreciated on auscultation.  Extremities: There is trace pitting edema in the distal left lower extremity.  Skin: Warm and dry without trophic changes noted.  Musculoskeletal: exam reveals no obvious joint deformities, tenderness or joint swelling or erythema.   Neurologically:  Mental status: The patient is awake, alert and oriented in all 4 spheres. Her immediate and remote memory, attention, language skills and fund of knowledge are appropriate. There is no evidence of aphasia, agnosia, apraxia or anomia. Speech is clear with normal prosody and enunciation. Thought process is linear. Mood is normal and affect is normal.  Cranial nerves II - XII are as described above under HEENT exam. In addition: shoulder shrug is normal with equal shoulder height noted. Motor exam:  Normal bulk, strength and tone is noted, with the exception of minimal left grip strength weakness. There is no tremor. Romberg is not tested for safety. Reflexes are 1+ in the upper extremities, trace in the knees, absent in the ankles bilaterally, 2+ in the left upper extremity. Fine motor skills  are mildly impaired in the left hand. Cerebellar testing: No dysmetria or intention tremor.  Sensory exam: intact to light touch in the upper and lower extremities.  Gait, station and balance: She stands slowly and stands slightly wide-based. She maneuvers her rolling walker quite well.  Assessment and Plan:  In summary, Sabrie Moritz Emelie Newsom is a very pleasant 68 y.o.-year old female with an underlying medical history of hyperlipidemia, hypertension, type 2 diabetes, paroxysmal A. fib, stroke, CHF, depression, anemia, arthritis, allergies, thyroid cancer with status post thyroidectomy and radioiodine therapy, and morbid obesity with a BMI of over 40, whose history and physical exam are concerning for obstructive sleep apnea (OSA). I had a long chat with the patient about my findings and the diagnosis of OSA, its prognosis and treatment options. We talked about medical treatments, surgical interventions and non-pharmacological approaches. I explained in particular the risks and ramifications of untreated moderate to severe OSA, especially with respect to developing cardiovascular disease down the Road, including congestive heart failure, difficult to treat hypertension, cardiac arrhythmias, or stroke. Even type 2 diabetes has, in part, been linked to untreated OSA. Symptoms of untreated OSA include daytime sleepiness, memory problems, mood irritability and mood disorder such as depression and anxiety, lack of energy, as well as recurrent headaches, especially morning headaches. We talked about trying to maintain a healthy lifestyle in general, as well as the importance of weight control. I encouraged the  patient to eat healthy, exercise daily and keep well hydrated, to keep a scheduled bedtime and wake time routine, to not skip any meals and eat healthy snacks in between meals. I advised the patient not to drive when feeling sleepy. I recommended the following at this time: sleep study with potential positive airway pressure titration. (We will score hypopneas at 4%).   I explained the sleep test procedure to the patient and also outlined possible surgical and non-surgical treatment options of OSA, including the use of a custom-made dental device (which would require a referral to a specialist dentist or oral surgeon), upper airway surgical options, such as pillar implants, radiofrequency surgery, tongue base surgery, and UPPP (which would involve a referral to an ENT surgeon). Rarely, jaw surgery such as mandibular advancement may be considered.  I also explained the CPAP treatment option to the patient, who indicated that she would be willing to try CPAP if the need arises. I explained the importance of being compliant with PAP treatment, not only for insurance purposes but primarily to improve Her symptoms, and for the patient's long term health benefit, including to reduce Her cardiovascular risks. I answered all her questions today and the patient was in agreement. I plan to see her back after the sleep study is completed and encouraged her to call with any interim questions, concerns, problems or updates.   Thank you very much for allowing me to participate in the care of this nice patient. If I can be of any further assistance to you please do not hesitate to talk to me.  Sincerely,   Star Age, MD, PhD

## 2019-03-06 NOTE — Therapy (Signed)
[place] [address] [place], [place], [address] Phone: [phone]   Fax:  [phone]  Physical Therapy Treatment  Patient Details  Name: [name] MRN: [id number] Date of Birth: 1951-03-18 Referring Provider (PT): Andrena Mews, MD   Encounter Date: 03/05/2019    03/05/19 0936  PT Visits / Re-Eval  Visit Number 7  Number of Visits 17  Authorization  Authorization Type [employer] Medicare  PT Time Calculation  PT Start Time 0932  PT Stop Time 1016  PT Time Calculation (min) 44 min  PT - End of Session  Equipment Utilized During Treatment Gait belt  Activity Tolerance Patient tolerated treatment well;Patient limited by fatigue  Behavior During Therapy [facility] for tasks assessed/performed     Past Medical History:  Diagnosis Date  . Anemia   . Arthritis   . CHF (congestive heart failure) ([place])   . Depression   . GERD (gastroesophageal reflux disease)   . Hemorrhoids   . Hyperlipidemia   . Hypertension   . Hypothyroidism   . Seasonal allergies   . Stroke ([place])   . Thyroid cancer ([place])    per pt S/p Total Thyroidectomy with Radioactive Iodine Therapy  . Type 1 diabetes mellitus ([place])     Past Surgical History:  Procedure Laterality Date  . ABDOMINAL ANGIOGRAM N/A 05/14/2012   Procedure: ABDOMINAL ANGIOGRAM;  Surgeon: Laverda Page, MD;  Location: [facility] CATH LAB;  Service: Cardiovascular;  Laterality: N/A;  . ABDOMINAL HYSTERECTOMY  1995   partial  . ANKLE FRACTURE SURGERY Right   . CARDIAC CATHETERIZATION     with coronary angiogram  . COLONOSCOPY N/A 09/09/2014   Procedure: COLONOSCOPY;  Surgeon: Gatha Mayer, MD;  Location: [place];  Service: Endoscopy;  Laterality: N/A;  . ESOPHAGOGASTRODUODENOSCOPY (EGD) WITH PROPOFOL N/A 12/03/2018   Procedure: ESOPHAGOGASTRODUODENOSCOPY (EGD) WITH PROPOFOL;  Surgeon: Doran Stabler, MD;  Location: [place];  Service: Gastroenterology;   Laterality: N/A;  . LEFT AND RIGHT HEART CATHETERIZATION WITH CORONARY ANGIOGRAM N/A 05/14/2012   Procedure: LEFT AND RIGHT HEART CATHETERIZATION WITH CORONARY ANGIOGRAM;  Surgeon: Laverda Page, MD;  Location: [facility] CATH LAB;  Service: Cardiovascular;  Laterality: N/A;  . LOOP RECORDER INSERTION N/A 12/23/2018   Procedure: LOOP RECORDER INSERTION;  Surgeon: Constance Haw, MD;  Location: [place] CV LAB;  Service: Cardiovascular;  Laterality: N/A;  . MINI METER   09/08/2014   ELECTRONIC INSULIN PUMP  . TEE WITHOUT CARDIOVERSION N/A 10/16/2018   Procedure: TRANSESOPHAGEAL ECHOCARDIOGRAM (TEE);  Surgeon: Buford Dresser, MD;  Location: [facility] ENDOSCOPY;  Service: Cardiovascular;  Laterality: N/A;  . THYROIDECTOMY  2006    There were no vitals filed for this visit.     03/05/19 0935  Symptoms/Limitations  Subjective No new complaints. No falls or pain to report. Does occasionally have soreness in the "tail bone" area. No issues after last session.   Pertinent History CVA 2016, HTN, thyroid CA, arthritis, CHF, depression, DM1, A-fib  Limitations Lifting;Standing;Walking;House hold activities  Patient Stated Goals To improve left side strength, walking & balance  Pain Assessment  Currently in Pain? No/denies  Pain Score 0      03/05/19 0937  Transfers  Transfers Sit to Stand;Stand to Sit  Sit to Stand 5: Supervision;With upper extremity assist;With armrests;From chair/3-in-1  Stand to Sit 5: Supervision;With upper extremity assist;With armrests;To chair/3-in-1  Ambulation/Gait  Ambulation/Gait Yes  Ambulation/Gait Assistance 5: Supervision  Ambulation/Gait Assistance Details cues on posture, rollator position  and step length with gait.   Ambulation Distance (Feet) 115 Feet (x1,  plus around gym)  Assistive device Rollator  Gait Pattern Step-through pattern;Decreased stance time - left;Decreased step length - right;Decreased weight shift to left;Antalgic;Trunk  flexed;Wide base of support  Ambulation Surface Level;Indoor  High Level Balance  High Level Balance Activities Side stepping;Marching forwards;Marching backwards  High Level Balance Comments blue mat in parallel bars with UE support: 3 laps each with cues on ex form/technique. min guard to min assist for balance.   Knee/Hip Exercises: Aerobic  Other Aerobic Scifit UE/LE's level 2.0 for 6 minutes with goal >/= 35 rpm for strengthening and activity tolerance.   Knee/Hip Exercises: Standing  Lateral Step Up Both;1 set;10 reps;Hand Hold: 2;Step Height: 4";Limitations  Lateral Step Up Limitations UE support on bars, cues for form/technique  Forward Step Up Both;1 set;10 reps;Hand Hold: 2;Step Height: 4";Limitations  Forward Step Up Limitations UE support on bars: cues for form/technique.         PT Short Term Goals - 02/11/19 1800      PT SHORT TERM GOAL #1   Title  Patient verbalizes & demonstrates understanding of initial HEP. (All STGs Target Date: 03/14/2019)    Time  4    Period  Weeks    Status  New    Target Date  03/14/19      PT SHORT TERM GOAL #2   Title  Patient verbalizes understanding of initial community based fitness activities like [employer].     Time  4    Period  Weeks    Status  New    Target Date  03/14/19      PT SHORT TERM GOAL #3   Title  Berg Balance >/= 36/56    Time  4    Period  Weeks    Status  New    Target Date  03/14/19      PT SHORT TERM GOAL #4   Title  Patient ambulates 250' with rollator walker with supervision.     Time  4    Period  Weeks    Status  New    Target Date  03/14/19        PT Long Term Goals - 02/11/19 1800      PT LONG TERM GOAL #1   Title  Patient verbalizes & demonstrates understanding of HEP & Ongoing fitness plan. (All LTGs Target Date: 04/11/2019)    Time  8    Period  Weeks    Status  New    Target Date  04/11/19      PT LONG TERM GOAL #2   Title  Berg Balance >/= 40/56 to indicate lower fall risk.     Time  8     Period  Weeks    Status  New    Target Date  04/11/19      PT LONG TERM GOAL #3   Title  Timed Up & Go <13.5sec with rollator walker to indicate lower fall risk.     Time  8    Period  Weeks    Status  New    Target Date  04/11/19      PT LONG TERM GOAL #4   Title  Patient ambulates 400' with rollator walker modified independent.    Time  8    Period  Weeks    Status  New    Target Date  04/11/19      PT LONG TERM  GOAL #5   Title  Patient negotiates ramps & curbs with rollator walker and stairs with single rail similar to home modified independent.     Time  8    Period  Weeks    Status  New    Target Date  04/11/19          03/05/19 0936  Plan  Clinical Impression Statement Today's skilled session continued to focus on LE strengthening and balance reactions. The pt is progressing toward goals and should benefit from continued PT to progress toward unmet goals.  Pt will benefit from skilled therapeutic intervention in order to improve on the following deficits Abnormal gait;Decreased activity tolerance;Decreased balance;Decreased coordination;Decreased endurance;Decreased mobility;Decreased strength;Dizziness;Impaired flexibility;Postural dysfunction;Obesity;Pain  Rehab Potential Good  PT Frequency 2x / week  PT Duration 8 weeks  PT Treatment/Interventions ADLs/Self Care Home Management;Cryotherapy;Moist Heat;Ultrasound;DME Instruction;Gait training;Stair training;Functional mobility training;Therapeutic activities;Therapeutic exercise;Balance training;Neuromuscular re-education;Patient/family education;Dry needling;Vestibular  PT Next Visit Plan continue to work on gait with rollator, balance reactions and LE strengthening  PT Home Exercise Plan Access Code: [email or web address]  Consulted and Agree with Plan of Care Patient         Patient will benefit from skilled therapeutic intervention in order to improve the following deficits and impairments:  Abnormal gait, Decreased  activity tolerance, Decreased balance, Decreased coordination, Decreased endurance, Decreased mobility, Decreased strength, Dizziness, Impaired flexibility, Postural dysfunction, Obesity, Pain  Visit Diagnosis: Unsteadiness on feet  Other abnormalities of gait and mobility  Muscle weakness (generalized)  Dizziness and giddiness     Problem List Patient Active Problem List   Diagnosis Date Noted  . Hot flashes 12/11/2018  . 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 Abnormal echocardiogram   . Stroke (cerebrum) ([place]) 10/13/2018  . Frequent PVCs   . Uncontrolled type 1 diabetes mellitus ([place])   . BPPV (benign paroxysmal positional vertigo), right 06/13/2017  . Premature atrial beats 06/13/2017  . Snoring 07/31/2016  . Pre-ulcerative corn or callous 12/15/2015  . History of CHF (congestive heart failure) 10/29/2015  . Essential hypertension 10/29/2015  . Cerebrovascular accident (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 ([place]) 10/29/2015  . Thyroid activity decreased   . Intracranial vascular stenosis   . HLD (hyperlipidemia)   . Cerebral infarction due to thrombosis of basilar artery ([place])   . CVA (cerebral vascular accident) ([place]) 08/29/2015  . Posterior tibial tendinitis of right leg 04/26/2014  . Atypical chest pain 10/06/2013  . Morbid obesity ([place]) 05/29/2013  . Back pain without radiation 04/28/2013  . Nail dystrophy 01/10/2013  . Diabetes mellitus ([place]) 01/25/2012  . Hyperlipidemia 01/25/2012  . Hypothyroidism 01/25/2012  . Hypertension 01/25/2012  . GERD (gastroesophageal reflux disease) 01/25/2012    [name], PTA, [facility] [address] [place], [place] [address] [phone] 03/06/19, 9:33 PM   Name: [name] MRN: [id number] Date of Birth: 1951/04/17

## 2019-03-10 ENCOUNTER — Ambulatory Visit: Payer: Medicare Other | Admitting: Physical Therapy

## 2019-03-10 ENCOUNTER — Other Ambulatory Visit: Payer: Self-pay

## 2019-03-10 ENCOUNTER — Encounter: Payer: Self-pay | Admitting: Physical Therapy

## 2019-03-10 DIAGNOSIS — R2681 Unsteadiness on feet: Secondary | ICD-10-CM | POA: Diagnosis not present

## 2019-03-10 DIAGNOSIS — R2689 Other abnormalities of gait and mobility: Secondary | ICD-10-CM

## 2019-03-10 DIAGNOSIS — R42 Dizziness and giddiness: Secondary | ICD-10-CM

## 2019-03-10 DIAGNOSIS — M6281 Muscle weakness (generalized): Secondary | ICD-10-CM

## 2019-03-10 NOTE — Progress Notes (Signed)
[facility] Summary Report / Loop Recorder 

## 2019-03-10 NOTE — Therapy (Signed)
[facility] [address] [place], [place], [address] Phone: [phone]   Fax:  [phone]  Physical Therapy Treatment  Patient Details  Name: [name] MRN: [id number] Date of Birth: 1951-06-22 Referring Provider (PT): Andrena Mews, MD   Encounter Date: 03/10/2019  PT End of Session - 03/10/19 0937    Visit Number  8    Number of Visits  17    Authorization Type  UHC Medicare    PT Start Time  [phone]    PT Stop Time  1014    PT Time Calculation (min)  40 min    Equipment Utilized During Treatment  Gait belt    Activity Tolerance  Patient tolerated treatment well;Patient limited by fatigue    Behavior During Therapy  [facility] for tasks assessed/performed       Past Medical History:  Diagnosis Date  . Anemia   . Arthritis   . CHF (congestive heart failure) ([place])   . Depression   . GERD (gastroesophageal reflux disease)   . Hemorrhoids   . Hyperlipidemia   . Hypertension   . Hypothyroidism   . Seasonal allergies   . Stroke ([place])   . Thyroid cancer ([place])    per pt S/p Total Thyroidectomy with Radioactive Iodine Therapy  . Type 1 diabetes mellitus ([place])     Past Surgical History:  Procedure Laterality Date  . ABDOMINAL ANGIOGRAM N/A 05/14/2012   Procedure: ABDOMINAL ANGIOGRAM;  Surgeon: Laverda Page, MD;  Location: [facility] CATH LAB;  Service: Cardiovascular;  Laterality: N/A;  . ABDOMINAL HYSTERECTOMY  1995   partial  . ANKLE FRACTURE SURGERY Right   . CARDIAC CATHETERIZATION     with coronary angiogram  . COLONOSCOPY N/A 09/09/2014   Procedure: COLONOSCOPY;  Surgeon: Gatha Mayer, MD;  Location: [facility];  Service: Endoscopy;  Laterality: N/A;  . ESOPHAGOGASTRODUODENOSCOPY (EGD) WITH PROPOFOL N/A 12/03/2018   Procedure: ESOPHAGOGASTRODUODENOSCOPY (EGD) WITH PROPOFOL;  Surgeon: Doran Stabler, MD;  Location: [facility];  Service: Gastroenterology;  Laterality: N/A;  . LEFT AND  RIGHT HEART CATHETERIZATION WITH CORONARY ANGIOGRAM N/A 05/14/2012   Procedure: LEFT AND RIGHT HEART CATHETERIZATION WITH CORONARY ANGIOGRAM;  Surgeon: Laverda Page, MD;  Location: [facility] CATH LAB;  Service: Cardiovascular;  Laterality: N/A;  . LOOP RECORDER INSERTION N/A 12/23/2018   Procedure: LOOP RECORDER INSERTION;  Surgeon: Constance Haw, MD;  Location: [facility] CV LAB;  Service: Cardiovascular;  Laterality: N/A;  . MINI METER   09/08/2014   ELECTRONIC INSULIN PUMP  . TEE WITHOUT CARDIOVERSION N/A 10/16/2018   Procedure: TRANSESOPHAGEAL ECHOCARDIOGRAM (TEE);  Surgeon: Buford Dresser, MD;  Location: [facility] ENDOSCOPY;  Service: Cardiovascular;  Laterality: N/A;  . THYROIDECTOMY  2006    There were no vitals filed for this visit.  Subjective Assessment - 03/10/19 0936    Subjective  No new complaitns. No falls or pain to report. Reports her tailbone is healing.     Pertinent History  CVA 2016, HTN, thyroid CA, arthritis, CHF, depression, DM1, A-fib    Limitations  Lifting;Standing;Walking;House hold activities    Patient Stated Goals  To improve left side strength, walking & balance    Currently in Pain?  No/denies    Pain Score  0-No pain         [facility] PT Assessment - 03/10/19 0940      Berg Balance Test   Sit to Stand  Able to stand  independently using hands  Standing Unsupported  Able to stand 2 minutes with supervision    Sitting with Back Unsupported but Feet Supported on Floor or Stool  Able to sit safely and securely 2 minutes    Stand to Sit  Sits safely with minimal use of hands    Transfers  Able to transfer safely, definite need of hands    Standing Unsupported with Eyes Closed  Able to stand 10 seconds with supervision    Standing Unsupported with Feet Together  Able to place feet together independently and stand for 1 minute with supervision    From Standing, Reach Forward with Outstretched Arm  Can reach forward >12 cm safely (5")   8 inches   From  Standing Position, Pick up Object from Floor  Unable to pick up shoe, but reaches 2-5 cm (1-2") from shoe and balances independently   fingertips touch, unable to grab it   From Standing Position, Turn to Look Behind Over each Shoulder  Looks behind one side only/other side shows less weight shift   right> left   Turn 360 Degrees  Needs close supervision or verbal cueing    Standing Unsupported, Alternately Place Feet on Step/Stool  Able to complete >2 steps/needs minimal assist    Standing Unsupported, One Foot in Front  Able to take small step independently and hold 30 seconds    Standing on One Leg  Tries to lift leg/unable to hold 3 seconds but remains standing independently    Total Score  36    Berg comment:  36/56= high risk for falls           [facility] Adult PT Treatment/Exercise - 03/10/19 0940      Transfers   Transfers  Sit to Stand;Stand to Sit    Sit to Stand  5: Supervision;With upper extremity assist;With armrests;From chair/3-in-1    Stand to Sit  5: Supervision;With upper extremity assist;With armrests;To chair/3-in-1      Ambulation/Gait   Ambulation/Gait  Yes    Ambulation/Gait Assistance  5: Supervision    Ambulation/Gait Assistance Details  cues on posture, rollator position and pursed lip breathing to assist with decr shortness of breath with gait.     Ambulation Distance (Feet)  230 Feet   x1, 115 x1   Assistive device  Rollator    Gait Pattern  Step-through pattern;Decreased stance time - left;Decreased step length - right;Decreased weight shift to left;Antalgic;Trunk flexed;Wide base of support    Ambulation Surface  Level;Indoor      Self-Care   Self-Care  Other Self-Care Comments    Other Self-Care Comments   discussed return to community fitness. Pt is an active member of silver sneakers at the [facility]. She has been there once since starting PT. This has been limiited past few weeks with coronavirus outbreak and social distancing. Pt's current HEP is still  challenging her. She reports doing the ex's every day she does not come to therapy.            PT Short Term Goals - 03/10/19 0937      PT SHORT TERM GOAL #1   Title  Patient verbalizes & demonstrates understanding of initial HEP. (All STGs Target Date: 03/14/2019)    Baseline  03/10/19: met with current HEP.    Status  Achieved    Target Date  03/14/19      PT SHORT TERM GOAL #2   Title  Patient verbalizes understanding of initial community based fitness activities like [employer].  Baseline  03/10/19: member of [employer] sne[employer], has been once since starting here. limited now due to coronavirus and limiting social organizations.    Status  Achieved    Target Date  03/14/19      PT SHORT TERM GOAL #3   Title  Berg Balance >/= 36/56    Baseline  03/10/19:  pt scored 36/56 today.     Time  --    Period  --    Status  Achieved    Target Date  03/14/19      PT SHORT TERM GOAL #4   Title  Patient ambulates 250' with rollator walker with supervision.     Baseline  03/10/19: pt ambulated 230 feet with rollator at supervision level before needing a rest break, improved since eval just not to goal.    Time  --    Period  --    Status  Partially Met    Target Date  03/14/19        PT Long Term Goals - 02/11/19 1800      PT LONG TERM GOAL #1   Title  Patient verbalizes & demonstrates understanding of HEP & Ongoing fitness plan. (All LTGs Target Date: 04/11/2019)    Time  8    Period  Weeks    Status  New    Target Date  04/11/19      PT LONG TERM GOAL #2   Title  Berg Balance >/= 40/56 to indicate lower fall risk.     Time  8    Period  Weeks    Status  New    Target Date  04/11/19      PT LONG TERM GOAL #3   Title  Timed Up & Go <13.5sec with rollator walker to indicate lower fall risk.     Time  8    Period  Weeks    Status  New    Target Date  04/11/19      PT LONG TERM GOAL #4   Title  Patient ambulates 400' with rollator walker modified independent.    Time  8     Period  Weeks    Status  New    Target Date  04/11/19      PT LONG TERM GOAL #5   Title  Patient negotiates ramps & curbs with rollator walker and stairs with single rail similar to home modified independent.     Time  8    Period  Weeks    Status  New    Target Date  04/11/19            Plan - 03/10/19 4975    Clinical Impression Statement  Today's skilled session focused on progress toward STGs with 3/4 goals met, 1/4 goals partially met. The pt is independent with current HEP and has returned to silver sneakers (on hold for now due to precautions related to coronavirus outbreak). She improved her Berg Balance test score to 36/56 today. She has also increased her overall consecutive gait distance to 230 feet before needing a rest break, improved just not to goal level. The pt is progressing well and should benefit from continued PT to progress toward unmet goals.                                  Rehab Potential  Good    PT Frequency  2x / week  PT Duration  8 weeks    PT Treatment/Interventions  ADLs/Self Care Home Management;Cryotherapy;Moist Heat;Ultrasound;DME Instruction;Gait training;Stair training;Functional mobility training;Therapeutic activities;Therapeutic exercise;Balance training;Neuromuscular re-education;Patient/family education;Dry needling;Vestibular    PT Next Visit Plan  continue to work on gait with rollator, balance reactions and LE strengthening    PT Home Exercise Plan  Access Code: [email or web address]    Consulted and Agree with Plan of Care  Patient       Patient will benefit from skilled therapeutic intervention in order to improve the following deficits and impairments:  Abnormal gait, Decreased activity tolerance, Decreased balance, Decreased coordination, Decreased endurance, Decreased mobility, Decreased strength, Dizziness, Impaired flexibility, Postural dysfunction, Obesity, Pain  Visit Diagnosis: Unsteadiness on feet  Other abnormalities of gait and  mobility  Muscle weakness (generalized)  Dizziness and giddiness     Problem List Patient Active Problem List   Diagnosis Date Noted  . Hot flashes 12/11/2018  . 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 Abnormal echocardiogram   . Stroke (cerebrum) ([place]) 10/13/2018  . Frequent PVCs   . Uncontrolled type 1 diabetes mellitus ([place])   . BPPV (benign paroxysmal positional vertigo), right 06/13/2017  . Premature atrial beats 06/13/2017  . Snoring 07/31/2016  . Pre-ulcerative corn or callous 12/15/2015  . History of CHF (congestive heart failure) 10/29/2015  . Essential hypertension 10/29/2015  . 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 Thyroid activity decreased   . Intracranial vascular stenosis   . HLD (hyperlipidemia)   . 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 CVA (cerebral vascular accident) ([place]) 08/29/2015  . Posterior tibial tendinitis of right leg 04/26/2014  . Atypical chest pain 10/06/2013  . Morbid obesity ([place]) 05/29/2013  . Back pain without radiation 04/28/2013  . Nail dystrophy 01/10/2013  . Diabetes mellitus ([place]) 01/25/2012  . Hyperlipidemia 01/25/2012  . Hypothyroidism 01/25/2012  . Hypertension 01/25/2012  . GERD (gastroesophageal reflux disease) 01/25/2012    [name], PTA, [facility] [address], [name], [place] [address] [phone] 03/10/19, 3:21 PM   Name: [name] MRN: [id number] Date of Birth: [birth year]

## 2019-03-12 ENCOUNTER — Encounter: Payer: Self-pay | Admitting: Physical Therapy

## 2019-03-12 ENCOUNTER — Ambulatory Visit: Payer: Medicare Other | Admitting: Physical Therapy

## 2019-03-12 DIAGNOSIS — R2681 Unsteadiness on feet: Secondary | ICD-10-CM

## 2019-03-12 DIAGNOSIS — R2689 Other abnormalities of gait and mobility: Secondary | ICD-10-CM

## 2019-03-12 DIAGNOSIS — M6281 Muscle weakness (generalized): Secondary | ICD-10-CM

## 2019-03-12 DIAGNOSIS — R293 Abnormal posture: Secondary | ICD-10-CM

## 2019-03-12 NOTE — Therapy (Signed)
[facility] [address] [place], [place], [address] Phone: [phone]   Fax:  [phone]  Physical Therapy Treatment  Patient Details  Name: [name] MRN: [id number] Date of Birth: 26-Aug-1951 Referring Provider (PT): Andrena Mews, MD   Encounter Date: 03/12/2019  PT End of Session - 03/12/19 0934    Visit Number  9    Number of Visits  17    Authorization Type  UHC Medicare    PT Start Time  0930    PT Stop Time  1015    PT Time Calculation (min)  45 min    Equipment Utilized During Treatment  Gait belt    Activity Tolerance  Patient tolerated treatment well;Patient limited by fatigue    Behavior During Therapy  [facility] for tasks assessed/performed       Past Medical History:  Diagnosis Date  . Anemia   . Arthritis   . CHF (congestive heart failure) ([place])   . Depression   . GERD (gastroesophageal reflux disease)   . Hemorrhoids   . Hyperlipidemia   . Hypertension   . Hypothyroidism   . Seasonal allergies   . Stroke ([place])   . Thyroid cancer ([place])    per pt S/p Total Thyroidectomy with Radioactive Iodine Therapy  . Type 1 diabetes mellitus ([place])     Past Surgical History:  Procedure Laterality Date  . ABDOMINAL ANGIOGRAM N/A 05/14/2012   Procedure: ABDOMINAL ANGIOGRAM;  Surgeon: Laverda Page, MD;  Location: [facility] CATH LAB;  Service: Cardiovascular;  Laterality: N/A;  . ABDOMINAL HYSTERECTOMY  1995   partial  . ANKLE FRACTURE SURGERY Right   . CARDIAC CATHETERIZATION     with coronary angiogram  . COLONOSCOPY N/A 09/09/2014   Procedure: COLONOSCOPY;  Surgeon: Gatha Mayer, MD;  Location: [place] [facility];  Service: Endoscopy;  Laterality: N/A;  . ESOPHAGOGASTRODUODENOSCOPY (EGD) WITH PROPOFOL N/A 12/03/2018   Procedure: ESOPHAGOGASTRODUODENOSCOPY (EGD) WITH PROPOFOL;  Surgeon: Doran Stabler, MD;  Location: [facility];  Service: Gastroenterology;  Laterality: N/A;  . LEFT AND  RIGHT HEART CATHETERIZATION WITH CORONARY ANGIOGRAM N/A 05/14/2012   Procedure: LEFT AND RIGHT HEART CATHETERIZATION WITH CORONARY ANGIOGRAM;  Surgeon: Laverda Page, MD;  Location: [facility] CATH LAB;  Service: Cardiovascular;  Laterality: N/A;  . LOOP RECORDER INSERTION N/A 12/23/2018   Procedure: LOOP RECORDER INSERTION;  Surgeon: Constance Haw, MD;  Location: [place] [facility] CV LAB;  Service: Cardiovascular;  Laterality: N/A;  . MINI METER   09/08/2014   ELECTRONIC INSULIN PUMP  . TEE WITHOUT CARDIOVERSION N/A 10/16/2018   Procedure: TRANSESOPHAGEAL ECHOCARDIOGRAM (TEE);  Surgeon: Buford Dresser, MD;  Location: [facility] ENDOSCOPY;  Service: Cardiovascular;  Laterality: N/A;  . THYROIDECTOMY  2006    There were no vitals filed for this visit.  Subjective Assessment - 03/12/19 0934    Subjective  No new complaints. No falls or pain to report.     Pertinent History  CVA 2016, HTN, thyroid CA, arthritis, CHF, depression, DM1, A-fib    Limitations  Lifting;Standing;Walking;House hold activities    Patient Stated Goals  To improve left side strength, walking & balance    Currently in Pain?  No/denies    Pain Score  0-No pain           [facility] Adult PT Treatment/Exercise - 03/12/19 0935      Transfers   Transfers  Sit to Stand;Stand to Sit    Sit to Stand  5: Supervision;With upper  extremity assist;With armrests;From chair/3-in-1    Stand to Sit  5: Supervision;With upper extremity assist;With armrests;To chair/3-in-1      Ambulation/Gait   Ambulation/Gait  Yes    Ambulation/Gait Assistance  5: Supervision    Ambulation/Gait Assistance Details  cues for upright posture and to stay closer to rollator.     Ambulation Distance (Feet)  100 Feet   x1, 270 x1, plus in/out/around gym   Assistive device  Rollator    Gait Pattern  Step-through pattern;Decreased stance time - left;Decreased step length - right;Decreased weight shift to left;Antalgic;Trunk flexed;Wide base of support     Ambulation Surface  Level;Indoor      High Level Balance   High Level Balance Activities  Side stepping;Marching forwards;Marching backwards;Tandem walking    High Level Balance Comments  blue mat in parallel bars with UE support: 3 laps each with cues on ex form/technique. min guard to min assist for balance.       Knee/Hip Exercises: Aerobic   Other Aerobic  Scifit UE/LE's level 2.5 for 8 minutes with goal >/= 30 rpm for strengthening and activity tolerance.           Balance Exercises - 03/12/19 0957      Balance Exercises: Standing   SLS with Vectors  Foam/compliant surface;Upper extremity assist 2;Other reps (comment);Limitations      Balance Exercises: Standing   SLS with Vectors Limitations  standing on airex with 2 tall cones in front, light bil UE support on bars: alternating fwd toe taps to each, then alternating cross toe taps to each for 10 reps each, min guard to min assist for balance with cues for incr hip/knee flexion and light taps.           PT Short Term Goals - 03/10/19 0937      PT SHORT TERM GOAL #1   Title  Patient verbalizes & demonstrates understanding of initial HEP. (All STGs Target Date: 03/14/2019)    Baseline  03/10/19: met with current HEP.    Status  Achieved    Target Date  03/14/19      PT SHORT TERM GOAL #2   Title  Patient verbalizes understanding of initial community based fitness activities like [employer].     Baseline  03/10/19: member of [employer], has been once since starting here. limited now due to coronavirus and limiting social organizations.    Status  Achieved    Target Date  03/14/19      PT SHORT TERM GOAL #3   Title  Berg Balance >/= 36/56    Baseline  03/10/19:  pt scored 36/56 today.     Time  --    Period  --    Status  Achieved    Target Date  03/14/19      PT SHORT TERM GOAL #4   Title  Patient ambulates 250' with rollator walker with supervision.     Baseline  03/10/19: pt ambulated 230 feet with rollator at  supervision level before needing a rest break, improved since eval just not to goal.    Time  --    Period  --    Status  Partially Met    Target Date  03/14/19        PT Long Term Goals - 02/11/19 1800      PT LONG TERM GOAL #1   Title  Patient verbalizes & demonstrates understanding of HEP & Ongoing fitness plan. (All LTGs Target Date: 04/11/2019)  Time  8    Period  Weeks    Status  New    Target Date  04/11/19      PT LONG TERM GOAL #2   Title  Berg Balance >/= 40/56 to indicate lower fall risk.     Time  8    Period  Weeks    Status  New    Target Date  04/11/19      PT LONG TERM GOAL #3   Title  Timed Up & Go <13.5sec with rollator walker to indicate lower fall risk.     Time  8    Period  Weeks    Status  New    Target Date  04/11/19      PT LONG TERM GOAL #4   Title  Patient ambulates 400' with rollator walker modified independent.    Time  8    Period  Weeks    Status  New    Target Date  04/11/19      PT LONG TERM GOAL #5   Title  Patient negotiates ramps & curbs with rollator walker and stairs with single rail similar to home modified independent.     Time  8    Period  Weeks    Status  New    Target Date  04/11/19            Plan - 03/12/19 0935    Clinical Impression Statement  Today's skilled session focused on strengthening, balance and activity tolerance with fatigue only reported. Seated rest breaks needed throughout session. The pt continues to progress toward goals and should benefit from continued PT to progress toward unmet goals.    Rehab Potential  Good    PT Frequency  2x / week    PT Duration  8 weeks    PT Treatment/Interventions  ADLs/Self Care Home Management;Cryotherapy;Moist Heat;Ultrasound;DME Instruction;Gait training;Stair training;Functional mobility training;Therapeutic activities;Therapeutic exercise;Balance training;Neuromuscular re-education;Patient/family education;Dry needling;Vestibular    PT Next Visit Plan  10th  visit progress note due next session; continue to work on gait with rollator, balance reactions and LE strengthening    PT Home Exercise Plan  Access Code: [email or web address]    Consulted and Agree with Plan of Care  Patient       Patient will benefit from skilled therapeutic intervention in order to improve the following deficits and impairments:  Abnormal gait, Decreased activity tolerance, Decreased balance, Decreased coordination, Decreased endurance, Decreased mobility, Decreased strength, Dizziness, Impaired flexibility, Postural dysfunction, Obesity, Pain  Visit Diagnosis: Unsteadiness on feet  Other abnormalities of gait and mobility  Muscle weakness (generalized)  Abnormal posture     Problem List Patient Active Problem List   Diagnosis Date Noted  . Hot flashes 12/11/2018  . 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 Abnormal echocardiogram   . Stroke (cerebrum) ([place]) 10/13/2018  . Frequent PVCs   . Uncontrolled type 1 diabetes mellitus ([place])   . BPPV (benign paroxysmal positional vertigo), right 06/13/2017  . Premature atrial beats 06/13/2017  . Snoring 07/31/2016  . Pre-ulcerative corn or callous 12/15/2015  . History of CHF (congestive heart failure) 10/29/2015  . Essential hypertension 10/29/2015  . Cerebrovascular accident (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 ([place]) 10/29/2015  . Thyroid activity decreased   . Intracranial vascular stenosis   . HLD (hyperlipidemia)   . Cerebral infarction due to thrombosis of basilar artery ([place])   . CVA (cerebral vascular accident) ([place]) 08/29/2015  . Posterior tibial tendinitis of  right leg 04/26/2014  . Atypical chest pain 10/06/2013  . Morbid obesity ([place]) 05/29/2013  . Back pain without radiation 04/28/2013  . Nail dystrophy 01/10/2013  . Diabetes mellitus ([place]) 01/25/2012  . Hyperlipidemia 01/25/2012  . Hypothyroidism 01/25/2012  . Hypertension 01/25/2012  . GERD (gastroesophageal reflux disease) 01/25/2012    [name], PTA, [facility]patient [facility] [address] [place], [place] [address] [phone] 03/12/19, 10:27 AM   Name: Nelly Scriven MRN: [id number] Date of Birth: 05/24/1951

## 2019-03-14 ENCOUNTER — Other Ambulatory Visit: Payer: Self-pay

## 2019-03-14 ENCOUNTER — Ambulatory Visit: Payer: Medicare Other | Admitting: Family Medicine

## 2019-03-14 ENCOUNTER — Encounter: Payer: Self-pay | Admitting: Family Medicine

## 2019-03-14 ENCOUNTER — Other Ambulatory Visit: Payer: Self-pay | Admitting: Family Medicine

## 2019-03-14 VITALS — BP 158/80 | HR 64 | Temp 98.0°F | Ht 67.0 in | Wt 266.0 lb

## 2019-03-14 DIAGNOSIS — R600 Localized edema: Secondary | ICD-10-CM

## 2019-03-14 DIAGNOSIS — E1065 Type 1 diabetes mellitus with hyperglycemia: Secondary | ICD-10-CM

## 2019-03-14 DIAGNOSIS — E1159 Type 2 diabetes mellitus with other circulatory complications: Secondary | ICD-10-CM

## 2019-03-14 DIAGNOSIS — I10 Essential (primary) hypertension: Secondary | ICD-10-CM

## 2019-03-14 DIAGNOSIS — I5033 Acute on chronic diastolic (congestive) heart failure: Secondary | ICD-10-CM

## 2019-03-14 DIAGNOSIS — Z794 Long term (current) use of insulin: Secondary | ICD-10-CM

## 2019-03-14 LAB — POCT GLYCOSYLATED HEMOGLOBIN (HGB A1C): HbA1c, POC (controlled diabetic range): 9.1 % — AB (ref 0.0–7.0)

## 2019-03-14 MED ORDER — TORSEMIDE 10 MG PO TABS: 10.0000 mg | ORAL_TABLET | Freq: Every day | ORAL | 0 refills | Status: DC | PRN

## 2019-03-14 NOTE — Progress Notes (Signed)
Subjective:  Patient ID: [name]  DOB: [birth year] MRN: [id number]  Ladora Osterberg Blannie Shedlock is a 68 y.o. female with a PMH of HLD, HTN, T1DM, paroxysmal A. fib, h/o CVA, depression, anemia, arthritis, allergies, thyroid cancer s/p thyroidectomy and radioiodine therapy, and morbid obesity with a BMI of over 40, here today for follow-up with lower extremity edema.   HPI:  Bilateral lower extremity edema ?h/o CHF, echo 10/14/2018 with EF 60 to 65%, moderate LVH, normal wall motion and difficult exam secondary to atrial fibrillation.   Patient has never been symptomatic from heart failure.  States that for the past few weeks she has noticed increasing swelling in her legs however she has not been bothered by it it was pointed out by physical therapy.  Patient does also endorse that when she lays flat at night she has trouble breathing.  Patient states that she has been propping herself up to breathe.  She uses a chair and has not been having to use pillows.  Patient states that she has been trying to work on her diet however she has been unable to lose weight and is up quite a few pounds.  Patient is unsure of what her baseline weight is given that she recently had weight loss and then had some weight gain. Patient has follow-up with cardiology next week ROS: Patient denies any chest pain, shortness of breath, dyspnea on exertion  Hypertension: - Medications: Metoprolol 75 mg, losartan 100 mg - Compliance: Yes - Checking BP at home: Yes and patient states it is normal at home - Denies any SOB, CP, vision changes, medication SEs, or symptoms of hypotension - Diet: Patient states she is trying to eat better however she is still noticing weight gain  ROS: All other systems otherwise negative, except as mentioned in HPI  Social hx: Denies use of illicit drugs, alcohol use Smoking status reviewed  Patient Active Problem List   Diagnosis Date Noted  . (HFpEF) heart failure with  preserved ejection fraction ([place]) 03/17/2019  . Hot flashes 12/11/2018  . 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 Abnormal echocardiogram   . Stroke (cerebrum) ([place]) 10/13/2018  . Frequent PVCs   . Uncontrolled type 1 diabetes mellitus ([place])   . BPPV (benign paroxysmal positional vertigo), right 06/13/2017  . Premature atrial beats 06/13/2017  . Snoring 07/31/2016  . Pre-ulcerative corn or callous 12/15/2015  . History of CHF (congestive heart failure) 10/29/2015  . Essential hypertension 10/29/2015  . Cerebrovascular accident (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 ([place]) 10/29/2015  . Intracranial vascular stenosis   . HLD (hyperlipidemia)   . Cerebral infarction due to thrombosis of basilar artery ([place])   . CVA (cerebral vascular accident) ([place]) 08/29/2015  . Morbid obesity ([place]) 05/29/2013  . Back pain without radiation 04/28/2013  . Nail dystrophy 01/10/2013  . Diabetes mellitus ([place]) 01/25/2012  . Hyperlipidemia 01/25/2012  . Hypothyroidism 01/25/2012  . Hypertension 01/25/2012  . GERD (gastroesophageal reflux disease) 01/25/2012     Objective:  BP (!) 158/80   Pulse 64   Temp 98 F (36.7 C) (Oral)   Ht 5\' 7"  (1.702 m)   Wt 266 lb (120.7 kg)   SpO2 97%   BMI 41.66 kg/m   Vitals and nursing note reviewed  General: NAD, pleasant, using walker Cardiac: irregular rhythm, regular rate, normal heart sounds, no m/r/g Pulm: normal effort, CTAB, no crackles noted Extremities: no edema or cyanosis. WWP. Skin: warm and dry, no rashes noted  Neuro: alert and oriented, no focal deficits Psych: normal affect, normal thought content  Assessment & Plan:   (HFpEF) heart failure with preserved ejection fraction ([place]) Echo 10/14/2018 with EF 60 to 65%, moderate LVH, normal wall motion. Patient has never been symptomatic from heart failure but for the past 2 weeks has experienced increasing lower extremity edema along with orthopnea.  Patient does not know her true baseline  weight. We will start patient on torsemide 10 mg for her to take daily for the first week and then as needed as needed. CMP collected in office with normal renal function however does show mildly elevated LFTs which will require further testing to r/o hepatitis, liver disease... BNP in office elevated to 617.   Patient has cardiology follow-up on 03/20/2019 and is overall asymptomatic and improving with diuresis.  Patient given strict return precautions.  Uncontrolled type 1 diabetes mellitus (HCC) A1c remains elevated however patient would like for her endocrinologist to manage her diabetes.  Patient may benefit from SGLT2 or GLP-1 therapy.  Essential hypertension Patient with multiple elevated blood pressures above goal at recent office visits.  We will increase her metoprolol to 100 mg daily.   Patient is to monitor for her heart rate and is to call the office if her heart rate goes below 50 bpm.  Patient does wear a Fitbit and has blood pressure monitor at home in order to track her heart rate.   [name] Artist Bloom, DO Family Medicine Resident PGY-2

## 2019-03-14 NOTE — Patient Instructions (Addendum)
Thank you for coming to see me today. It was a pleasure! Today we talked about:   The swelling in your legs.  I have started you on a medication called torsemide.  Please take 10 mg of torsemide in order to cause you to urinate more in order to get some of the fluid off of you.  I would take this in the morning times.  You may use this daily for now but once you have gotten a lot of the fluid off of your lower legs and I would start using this as needed for fluid.  Feel free to message me on my chart at any time if you have questions about this.  I would also like to increase your metoprolol to 100 mg once daily.  Please take 2 tablets at a time now instead of 1-1/2.  It will be important for you to monitor your heart rate at home and let me know if it goes below 50.  You may do this with your blood pressure cuff or by counting her heart rate to a timer at 60 second intervals.    I will release your lab results on my chart and call you if anything is abnormal.  Please practice good handwashing as well as social distancing during this time.  Please follow-up with me in 6-8 weeks or sooner as needed.  If you have any questions or concerns, please do not hesitate to call the office at [phone].  Take Care,   [name] Shanicka Oldenkamp, DO

## 2019-03-15 ENCOUNTER — Encounter: Payer: Self-pay | Admitting: Family Medicine

## 2019-03-15 LAB — CMP14+EGFR
ALT: 55 IU/L — ABNORMAL HIGH (ref 0–32)
AST: 78 IU/L — ABNORMAL HIGH (ref 0–40)
Albumin/Globulin Ratio: 1.4 (ref 1.2–2.2)
Albumin: 3.4 g/dL — ABNORMAL LOW (ref 3.8–4.8)
Alkaline Phosphatase: 250 IU/L — ABNORMAL HIGH (ref 39–117)
BUN/Creatinine Ratio: 13 (ref 12–28)
BUN: 12 mg/dL (ref 8–27)
Bilirubin Total: 0.4 mg/dL (ref 0.0–1.2)
CO2: 24 mmol/L (ref 20–29)
Calcium: 8.6 mg/dL — ABNORMAL LOW (ref 8.7–10.3)
Chloride: 105 mmol/L (ref 96–106)
Creatinine, Ser: 0.96 mg/dL (ref 0.57–1.00)
GFR calc Af Amer: 70 mL/min/{1.73_m2} (ref >59)
GFR calc non Af Amer: 61 mL/min/{1.73_m2} (ref >59)
Globulin, Total: 2.5 g/dL (ref 1.5–4.5)
Glucose: 214 mg/dL — ABNORMAL HIGH (ref 65–99)
Potassium: 4.6 mmol/L (ref 3.5–5.2)
Sodium: 141 mmol/L (ref 134–144)
Total Protein: 5.9 g/dL — ABNORMAL LOW (ref 6.0–8.5)

## 2019-03-15 LAB — CBC WITH DIFFERENTIAL/PLATELET
Basophils Absolute: 0 10*3/uL (ref 0.0–0.2)
Basos: 1 %
EOS (ABSOLUTE): 0.1 10*3/uL (ref 0.0–0.4)
EOS: 2 %
HEMATOCRIT: 32.2 % — AB (ref 34.0–46.6)
Hemoglobin: 9.9 g/dL — ABNORMAL LOW (ref 11.1–15.9)
Immature Grans (Abs): 0 10*3/uL (ref 0.0–0.1)
Immature Granulocytes: 0 %
Lymphocytes Absolute: 1.1 10*3/uL (ref 0.7–3.1)
Lymphs: 29 %
MCH: 27.6 pg (ref 26.6–33.0)
MCHC: 30.7 g/dL — ABNORMAL LOW (ref 31.5–35.7)
MCV: 90 fL (ref 79–97)
Monocytes Absolute: 0.4 10*3/uL (ref 0.1–0.9)
Monocytes: 11 %
Neutrophils Absolute: 2.2 10*3/uL (ref 1.4–7.0)
Neutrophils: 57 %
Platelets: 249 10*3/uL (ref 150–450)
RBC: 3.59 x10E6/uL — ABNORMAL LOW (ref 3.77–5.28)
RDW: 13.1 % (ref 11.7–15.4)
WBC: 3.9 10*3/uL (ref 3.4–10.8)

## 2019-03-15 LAB — BRAIN NATRIURETIC PEPTIDE: BNP: 617.9 pg/mL — ABNORMAL HIGH (ref 0.0–100.0)

## 2019-03-17 ENCOUNTER — Ambulatory Visit: Payer: Medicare Other | Admitting: Physical Therapy

## 2019-03-17 ENCOUNTER — Telehealth: Payer: Self-pay | Admitting: Physical Therapy

## 2019-03-17 ENCOUNTER — Other Ambulatory Visit: Payer: Self-pay | Admitting: Family Medicine

## 2019-03-17 DIAGNOSIS — I5030 Unspecified diastolic (congestive) heart failure: Secondary | ICD-10-CM | POA: Insufficient documentation

## 2019-03-17 DIAGNOSIS — I5032 Chronic diastolic (congestive) heart failure: Secondary | ICD-10-CM | POA: Insufficient documentation

## 2019-03-17 MED ORDER — METOPROLOL SUCCINATE ER 100 MG PO TB24: ORAL_TABLET | ORAL | 1 refills | Status: DC

## 2019-03-17 NOTE — Telephone Encounter (Signed)
[name] was contacted today regarding the temporary closing of [facility] due to Covid-19.  Therapist left message as to purpose of call was to check on and see how her home program was going and address any questions as the clinic is closed to pt care for 2 weeks. Left message that therapist will call back later today or tomorrow.  [name], PTA, [facility] [address], [place], [place] [address] [phone] 03/17/19, 1:30 PM   [facility] [address] [place], [place]  [address] Phone:  [phone] Fax:  [phone] \

## 2019-03-17 NOTE — Assessment & Plan Note (Addendum)
Echo 10/14/2018 with EF 60 to 65%, moderate LVH, normal wall motion. Patient has never been symptomatic from heart failure but for the past 2 weeks has experienced increasing lower extremity edema along with orthopnea.  Patient does not know her true baseline weight. We will start patient on torsemide 10 mg for her to take daily for the first week and then as needed as needed. CMP collected in office with normal renal function however does show mildly elevated LFTs which will require further testing to r/o hepatitis, liver disease... BNP in office elevated to 617.   Patient has cardiology follow-up on 03/20/2019 and is overall asymptomatic and improving with diuresis.  Patient given strict return precautions.

## 2019-03-17 NOTE — Assessment & Plan Note (Signed)
A1c remains elevated however patient would like for her endocrinologist to manage her diabetes.  Patient may benefit from SGLT2 or GLP-1 therapy.

## 2019-03-17 NOTE — Assessment & Plan Note (Signed)
Patient with multiple elevated blood pressures above goal at recent office visits.  We will increase her metoprolol to 100 mg daily.   Patient is to monitor for her heart rate and is to call the office if her heart rate goes below 50 bpm.  Patient does wear a Fitbit and has blood pressure monitor at home in order to track her heart rate.

## 2019-03-18 ENCOUNTER — Telehealth: Payer: Self-pay | Admitting: Physical Therapy

## 2019-03-18 NOTE — Telephone Encounter (Addendum)
Agnes Brightbill was contacted today regarding the temporary closing of [facility] due to Covid-19.  Therapist left message that if she should have any issues arise over the next few weeks to call the office and leave a message. We will have staff checking messages daily. Left message that if further schedule changes are to occur our front office staff will contact her.    [facility] will follow up with patients when we are able to resume care.  [name], PTA, [facility] [address] [place], [place] [address] [phone] 03/18/19, 10:41 AM   [facility] [address] [place], [place]  [address] Phone:  [phone] Fax:  [phone]   03/18/2019 11:10 am- Patient returned therapist phone call and reports her home program is going well. She appreciates [place] checking in on her and hopes to return the week of 03/31/2019. Pt was again advised our front office would be in touch should further schedule changes occur.   [name], PTA, [facility] [address], [place], [place] [address] [phone] 03/18/19, 11:17 AM

## 2019-03-19 ENCOUNTER — Ambulatory Visit: Payer: Medicare Other | Admitting: Physical Therapy

## 2019-03-20 ENCOUNTER — Other Ambulatory Visit: Payer: Self-pay

## 2019-03-20 ENCOUNTER — Ambulatory Visit (HOSPITAL_COMMUNITY)
Admission: RE | Admit: 2019-03-20 | Discharge: 2019-03-20 | Disposition: A | Discharge status: Home / Self Care | Payer: Medicare Other | Source: Ambulatory Visit | Attending: Physician Assistant | Admitting: Physician Assistant

## 2019-03-20 ENCOUNTER — Other Ambulatory Visit (HOSPITAL_COMMUNITY): Payer: Self-pay | Admitting: *Deleted

## 2019-03-20 ENCOUNTER — Encounter (HOSPITAL_COMMUNITY): Payer: Self-pay | Admitting: Physician Assistant

## 2019-03-20 VITALS — BP 142/74 | HR 61 | Ht 67.0 in | Wt 251.0 lb

## 2019-03-20 DIAGNOSIS — E109 Type 1 diabetes mellitus without complications: Secondary | ICD-10-CM | POA: Diagnosis not present

## 2019-03-20 DIAGNOSIS — E039 Hypothyroidism, unspecified: Secondary | ICD-10-CM | POA: Diagnosis not present

## 2019-03-20 DIAGNOSIS — I5030 Unspecified diastolic (congestive) heart failure: Secondary | ICD-10-CM | POA: Insufficient documentation

## 2019-03-20 DIAGNOSIS — R9431 Abnormal electrocardiogram [ECG] [EKG]: Secondary | ICD-10-CM | POA: Insufficient documentation

## 2019-03-20 DIAGNOSIS — R0683 Snoring: Secondary | ICD-10-CM | POA: Diagnosis not present

## 2019-03-20 DIAGNOSIS — K219 Gastro-esophageal reflux disease without esophagitis: Secondary | ICD-10-CM | POA: Diagnosis not present

## 2019-03-20 DIAGNOSIS — Z85850 Personal history of malignant neoplasm of thyroid: Secondary | ICD-10-CM | POA: Insufficient documentation

## 2019-03-20 DIAGNOSIS — Z91040 Latex allergy status: Secondary | ICD-10-CM | POA: Diagnosis not present

## 2019-03-20 DIAGNOSIS — Z79890 Hormone replacement therapy (postmenopausal): Secondary | ICD-10-CM | POA: Diagnosis not present

## 2019-03-20 DIAGNOSIS — Z6839 Body mass index (BMI) 39.0-39.9, adult: Secondary | ICD-10-CM | POA: Insufficient documentation

## 2019-03-20 DIAGNOSIS — Z79899 Other long term (current) drug therapy: Secondary | ICD-10-CM | POA: Insufficient documentation

## 2019-03-20 DIAGNOSIS — Z794 Long term (current) use of insulin: Secondary | ICD-10-CM | POA: Insufficient documentation

## 2019-03-20 DIAGNOSIS — E785 Hyperlipidemia, unspecified: Secondary | ICD-10-CM | POA: Insufficient documentation

## 2019-03-20 DIAGNOSIS — E669 Obesity, unspecified: Secondary | ICD-10-CM | POA: Diagnosis not present

## 2019-03-20 DIAGNOSIS — I110 Hypertensive heart disease with heart failure: Secondary | ICD-10-CM | POA: Diagnosis not present

## 2019-03-20 DIAGNOSIS — I480 Paroxysmal atrial fibrillation: Secondary | ICD-10-CM | POA: Diagnosis not present

## 2019-03-20 DIAGNOSIS — Z7901 Long term (current) use of anticoagulants: Secondary | ICD-10-CM | POA: Diagnosis not present

## 2019-03-20 LAB — BASIC METABOLIC PANEL
Anion gap: 9 (ref 5–15)
BUN: 11 mg/dL (ref 8–23)
CO2: 27 mmol/L (ref 22–32)
CREATININE: 1 mg/dL (ref 0.44–1.00)
Calcium: 8.7 mg/dL — ABNORMAL LOW (ref 8.9–10.3)
Chloride: 102 mmol/L (ref 98–111)
GFR calc Af Amer: >60 mL/min (ref >60)
GFR calc non Af Amer: 58 mL/min — ABNORMAL LOW (ref >60)
Glucose, Bld: 279 mg/dL — ABNORMAL HIGH (ref 70–99)
Potassium: 3.5 mmol/L (ref 3.5–5.1)
Sodium: 138 mmol/L (ref 135–145)

## 2019-03-20 LAB — CBC
HCT: 33.9 % — ABNORMAL LOW (ref 36.0–46.0)
Hemoglobin: 10.3 g/dL — ABNORMAL LOW (ref 12.0–15.0)
MCH: 27 pg (ref 26.0–34.0)
MCHC: 30.4 g/dL (ref 30.0–36.0)
MCV: 88.7 fL (ref 80.0–100.0)
Platelets: 252 10*3/uL (ref 150–400)
RBC: 3.82 MIL/uL — ABNORMAL LOW (ref 3.87–5.11)
RDW: 14.2 % (ref 11.5–15.5)
WBC: 3.9 10*3/uL — ABNORMAL LOW (ref 4.0–10.5)
nRBC: 0 % (ref 0.0–0.2)

## 2019-03-20 MED ORDER — POTASSIUM CHLORIDE ER 10 MEQ PO TBCR: EXTENDED_RELEASE_TABLET | ORAL | 2 refills | Status: DC

## 2019-03-20 MED ORDER — APIXABAN 5 MG PO TABS: 5.0000 mg | ORAL_TABLET | Freq: Two times a day (BID) | ORAL | 2 refills | Status: DC

## 2019-03-20 NOTE — Progress Notes (Signed)
Primary Care Physician: Shirley, [name], DO Primary Cardiologist: Dr Marlou Porch Primary Electrophysiologist: Dr Curt Bears Referring Physician: Dr Marta Lamas [name] is a 68 y.o. female with a history of HTN, type I DM, HLD, thyroid cancer s/p thyroidectomy, CVA, GERD, and paroxysmal atrial fibrillation who presents for consultation in the [facility]. Patient diagnosed with CVA on 10/13/18 and was seen to have frequent atrial ectopy on telemetry. ILR placed by Dr Curt Bears. The patient was initially diagnosed with atrial fibrillation 02/09/19 on ILR, confirmed by Dr Curt Bears. The episode lasted for about 30 minutes. She was asymptomatic during the episode. At her recent PCP office visit she admitted that she had more SOB, orthopnea, and her legs had swollen. BNP was ordered and was 617. PCP started her on torsemide and since then she reports that her breathing is much better and her legs are less edematous. She has not had any heart racing or palpitations.  Today, she denies symptoms of palpitations, chest pain, orthopnea, PND, lower extremity edema, dizziness, presyncope, syncope, bleeding, or neurologic sequela. The patient is tolerating medications without difficulties and is otherwise without complaint today.    Atrial Fibrillation Risk Factors:  she does have symptoms or diagnosis of sleep apnea. Sleep study ordered per neurology.  she does not have a history of rheumatic fever. she does not have a history of alcohol use. The patient does not have a history of early familial atrial fibrillation or other arrhythmias.  she has a BMI of Body mass index is 39.31 kg/m.Marland Kitchen Filed Weights   03/20/19 1107  Weight: 113.9 kg    Family History  Problem Relation Age of Onset  . Heart disease Mother   . Hyperlipidemia Mother   . Hypertension Mother   . Renal Disease Mother        insufficiency  . Heart attack Mother        in his 50's  . Early death  Father        Head Injury at Work  . Hyperlipidemia Father   . Hypertension Father   . Stroke Son   . Diabetes Maternal Aunt   . Prostate cancer Paternal Uncle   . Heart disease Maternal Aunt   . Heart failure Maternal Grandfather   . Heart disease Maternal Grandfather   . Heart attack Maternal Grandfather        Died in his 65's  . Breast cancer Neg Hx      Atrial Fibrillation Management history:  Previous antiarrhythmic drugs: none Previous cardioversions: none Previous ablations: none CHADS2VASC score: 5 (female, age, CVA, DM) Anticoagulation history: none (on Plavix post CVA)   Past Medical History:  Diagnosis Date  . Anemia   . Arthritis   . CHF (congestive heart failure) ([place])   . Depression   . GERD (gastroesophageal reflux disease)   . Hemorrhoids   . Hyperlipidemia   . Hypertension   . Hypothyroidism   . Seasonal allergies   . Stroke ([place])   . Thyroid cancer ([place])    per pt S/p Total Thyroidectomy with Radioactive Iodine Therapy  . Type 1 diabetes mellitus ([place])    Past Surgical History:  Procedure Laterality Date  . ABDOMINAL ANGIOGRAM N/A 05/14/2012   Procedure: ABDOMINAL ANGIOGRAM;  Surgeon: Laverda Page, MD;  Location: [facility] CATH LAB;  Service: Cardiovascular;  Laterality: N/A;  . ABDOMINAL HYSTERECTOMY  1995   partial  . ANKLE FRACTURE SURGERY Right   . CARDIAC CATHETERIZATION  with coronary angiogram  . COLONOSCOPY N/A 09/09/2014   Procedure: COLONOSCOPY;  Surgeon: Gatha Mayer, MD;  Location: [facility];  Service: Endoscopy;  Laterality: N/A;  . ESOPHAGOGASTRODUODENOSCOPY (EGD) WITH PROPOFOL N/A 12/03/2018   Procedure: ESOPHAGOGASTRODUODENOSCOPY (EGD) WITH PROPOFOL;  Surgeon: Doran Stabler, MD;  Location: [facility];  Service: Gastroenterology;  Laterality: N/A;  . LEFT AND RIGHT HEART CATHETERIZATION WITH CORONARY ANGIOGRAM N/A 05/14/2012   Procedure: LEFT AND RIGHT HEART CATHETERIZATION WITH CORONARY ANGIOGRAM;  Surgeon: Laverda Page, MD;  Location: [facility] CATH LAB;  Service: Cardiovascular;  Laterality: N/A;  . LOOP RECORDER INSERTION N/A 12/23/2018   Procedure: LOOP RECORDER INSERTION;  Surgeon: Constance Haw, MD;  Location: [facility] CV LAB;  Service: Cardiovascular;  Laterality: N/A;  . MINI METER   09/08/2014   ELECTRONIC INSULIN PUMP  . TEE WITHOUT CARDIOVERSION N/A 10/16/2018   Procedure: TRANSESOPHAGEAL ECHOCARDIOGRAM (TEE);  Surgeon: Buford Dresser, MD;  Location: [facility] ENDOSCOPY;  Service: Cardiovascular;  Laterality: N/A;  . THYROIDECTOMY  2006    Current Outpatient Medications  Medication Sig Dispense Refill  . acetaminophen (TYLENOL) 500 MG tablet Take 1,000 mg by mouth as needed for mild pain.    Marland Kitchen apixaban (ELIQUIS) 5 MG TABS tablet Take 1 tablet (5 mg total) by mouth 2 (two) times daily. 180 tablet 2  . atorvastatin (LIPITOR) 80 MG tablet Take 1 tablet (80 mg total) by mouth daily. 90 tablet 2  . CRANBERRY EXTRACT PO Take 1 capsule by mouth daily.    . diclofenac sodium (VOLTAREN) 1 % GEL Apply 4 g topically 4 (four) times daily. 100 g 2  . fexofenadine (ALLEGRA) 30 MG tablet Take 30 mg by mouth daily as needed (allergies).     Marland Kitchen FLUoxetine (PROZAC) 10 MG capsule Take 1 capsule (10 mg total) by mouth daily. 90 capsule 1  . FLUoxetine (PROZAC) 20 MG capsule Take 20 mg by mouth daily.    Marland Kitchen gabapentin (NEURONTIN) 300 MG capsule Take 1 capsule (300 mg total) by mouth at bedtime. 90 capsule 3  . Insulin Human (INSULIN PUMP) SOLN Inject into the skin continuous. humalog insulin    . levothyroxine (SYNTHROID, LEVOTHROID) 175 MCG tablet Take 175 mcg by mouth daily before breakfast.   12  . losartan (COZAAR) 100 MG tablet Take 1 tablet (100 mg total) by mouth daily. 90 tablet 3  . metoprolol succinate (TOPROL-XL) 100 MG 24 hr tablet Take 1 tablet by mouth daily 90 tablet 1  . pantoprazole (PROTONIX) 40 MG tablet Take 1 tablet (40 mg total) by mouth daily. 90 tablet 0  . torsemide (DEMADEX) 10 MG  tablet TAKE 1 TABLET BY MOUTH DAILY AS NEEDED FOR FLUID 90 tablet 0   No current facility-administered medications for this encounter.     Allergies  Allergen Reactions  . Latex Rash    Social History   Socioeconomic History  . Marital status: Divorced    Spouse name: Not on file  . Number of children: 1  . Years of education: Not on file  . Highest education level: Not on file  Occupational History  . Occupation: retired Tour manager  . Financial resource strain: Not on file  . Food insecurity:    Worry: Not on file    Inability: Not on file  . Transportation needs:    Medical: Not on file    Non-medical: Not on file  Tobacco Use  . Smoking status: Never Smoker  . Smokeless tobacco:  Never Used  Substance and Sexual Activity  . Alcohol use: No  . Drug use: No  . Sexual activity: Not on file  Lifestyle  . Physical activity:    Days per week: Not on file    Minutes per session: Not on file  . Stress: Not on file  Relationships  . Social connections:    Talks on phone: Not on file    Gets together: Not on file    Attends religious service: Not on file    Active member of club or organization: Not on file    Attends meetings of clubs or organizations: Not on file    Relationship status: Not on file  . Intimate partner violence:    Fear of current or ex partner: Not on file    Emotionally abused: Not on file    Physically abused: Not on file    Forced sexual activity: Not on file  Other Topics Concern  . Not on file  Social History Narrative   Retired Pharmacist, hospital, high school teacher taught mass. One son, helping to care for her granddaughter. No caffeine. Updated 07/20/2014.     ROS- All systems are reviewed and negative except as per the HPI above.  Physical Exam: Vitals:   03/20/19 1107  BP: (!) 142/74  Pulse: 61  Weight: 113.9 kg  Height: 5\' 7"  (1.702 m)    GEN- The patient is well appearing obese female, alert and oriented x 3 today.    HEENT-head normocephalic, atraumatic, sclera clear, conjunctiva pink, hearing intact, trachea midline. Lungs- Clear to ausculation bilaterally, normal work of breathing Heart- Regular rate and rhythm, no murmurs, rubs or gallops  GI- soft, NT, ND, + BS Extremities- no clubbing, cyanosis, trace edema MS- no significant deformity or atrophy Skin- no rash or lesion Psych- euthymic mood, full affect Neuro- strength and sensation are intact   Wt Readings from Last 3 Encounters:  03/20/19 113.9 kg  03/14/19 120.7 kg  03/06/19 119.3 kg    EKG today demonstrates SR HR 61, PACs, PR 158, QRS 96, QTc 487.  Echo 10/14/18 demonstrated  - Procedure narrative: Transthoracic echocardiography. Technically   difficult study. Intravenous contrast (Definity) was   administered. - Left ventricle: The cavity size was normal. There was moderate   concentric hypertrophy. Systolic function was normal. The   estimated ejection fraction was in the range of 60% to 65%. Wall   motion was normal; there were no regional wall motion   abnormalities. The study is not technically sufficient to allow   evaluation of LV diastolic function. - Left atrium: The atrium was normal in size. - Right atrium: The atrium was normal in size.  Epic records are reviewed at length today  Assessment and Plan:  1. Paroxysmal atrial fibrillation The patient has paroxysmal atrial fibrillation noted on ILR. Device report pulled from [facility] which showed one episode of afib since her last visit which lasted about one hour and was rate controlled for the most part. She was unaware. Overall burden 1%. Continue Eliquis 5 mg BID Continue Toprol 100 mg daily  This patients CHA2DS2-VASc Score and unadjusted Ischemic Stroke Rate (% per year) is equal to 7.2 % stroke rate/year from a score of 5  Above score calculated as 1 point each if present [CHF, HTN, DM, Vascular=MI/PAD/Aortic Plaque, Age if 65-74, or Female] Above score  calculated as 2 points each if present [Age > 75, or Stroke/TIA/TE]   2. Obesity Body mass index is 39.31 kg/m.  Lifestyle modification was discussed and encouraged including regular physical activity and weight reduction.  3. Snoring Plans for home sleep study noted.  4. HTN Stable, no change today.  5. HFpEF Patient recently had symptoms of fluid overload. Her weight is down almost 15 lbs from her visit on 3/20. Orthopnea resolved. Continue torsemide 10 mg. Encouraged her to buy a scale so she can weigh daily. She admitted to a high sodium diet with a lot of eating out and canned foods. Encourage lifestyle changes including DASH diet and 2g sodium restriction.  Will start K+ 10 meq to take with torsemide. Renal function stable on Bmet.   Follow up for Telehealth visit with Afib clinic in 2 months.   [facility] [address] [place], [place] [address] [phone] 03/20/2019 12:35 PM

## 2019-03-24 ENCOUNTER — Ambulatory Visit: Payer: Medicare Other | Admitting: Physical Therapy

## 2019-03-26 ENCOUNTER — Ambulatory Visit: Payer: Medicare Other | Admitting: Physical Therapy

## 2019-03-31 ENCOUNTER — Ambulatory Visit: Payer: Medicare Other | Admitting: Physical Therapy

## 2019-04-01 ENCOUNTER — Ambulatory Visit: Payer: Medicare Other | Admitting: *Deleted

## 2019-04-01 ENCOUNTER — Other Ambulatory Visit: Payer: Self-pay

## 2019-04-02 ENCOUNTER — Ambulatory Visit: Payer: Medicare Other | Admitting: Physical Therapy

## 2019-04-02 LAB — CUP PACEART REMOTE DEVICE CHECK
Date Time Interrogation Session: 20200407160818
Implantable Pulse Generator Implant Date: 20191230

## 2019-04-07 ENCOUNTER — Ambulatory Visit: Payer: Medicare Other | Admitting: Physical Therapy

## 2019-04-09 ENCOUNTER — Ambulatory Visit: Payer: Medicare Other | Admitting: Physical Therapy

## 2019-04-13 ENCOUNTER — Encounter: Payer: Self-pay | Admitting: Family Medicine

## 2019-04-14 ENCOUNTER — Encounter: Payer: Self-pay | Admitting: Family Medicine

## 2019-04-14 ENCOUNTER — Telehealth (INDEPENDENT_AMBULATORY_CARE_PROVIDER_SITE_OTHER): Payer: Medicare Other | Admitting: Family Medicine

## 2019-04-14 ENCOUNTER — Other Ambulatory Visit: Payer: Self-pay | Admitting: Family Medicine

## 2019-04-14 DIAGNOSIS — R112 Nausea with vomiting, unspecified: Secondary | ICD-10-CM | POA: Diagnosis not present

## 2019-04-14 DIAGNOSIS — R197 Diarrhea, unspecified: Secondary | ICD-10-CM

## 2019-04-14 MED ORDER — LOPERAMIDE HCL 2 MG PO TABS: 2.0000 mg | ORAL_TABLET | Freq: Four times a day (QID) | ORAL | 0 refills | Status: DC | PRN

## 2019-04-14 MED ORDER — ONDANSETRON 4 MG PO TBDP: 4.0000 mg | ORAL_TABLET | Freq: Three times a day (TID) | ORAL | 0 refills | Status: DC | PRN

## 2019-04-14 NOTE — Telephone Encounter (Signed)
Please see telemedicine visit from today as his issue was addressed and Rx sent.

## 2019-04-14 NOTE — Telephone Encounter (Signed)
Pt called this morning stating that she has had diarrhea for 3 days and wanted to see if she could be prescribed some medication. Please give pt a call back.

## 2019-04-14 NOTE — Telephone Encounter (Signed)
[facility] Telemedicine Visit  Patient consented to have virtual visit. Method of visit: Telephone  Encounter participants: Patient: [name]  Provider: Bufford Lope - located at [facility] Others (if applicable): not applicable  Chief Complaint: nausea, vomiting, diarrhea  HPI:  Patient states that she had 3 days of nausea, nonbilious nonbloody vomiting and diarrhea.  She has had some mild cramping abdominal pain.  She has had no sick contacts.  No fever, cough, shortness of breath.  She is tolerating p.o. and urinating normally.  ROS: per HPI  Pertinent PMHx: Not applicable  Assessment/Plan: Gastroenteritis Likely viral gastroenteritis.  Discussed symptomatic management at home with good p.o. hydration.  Also discussed good hand hygiene to prevent spread.  Given Rx for Zofran and Imodium.  Discussed red flags and to call back if needed.  Time spent during visit with patient: 6 minutes

## 2019-04-14 NOTE — Telephone Encounter (Signed)
Please see telemedicine visit from today as this issue was addressed and Rx sent.

## 2019-04-15 ENCOUNTER — Other Ambulatory Visit: Payer: Self-pay

## 2019-04-15 ENCOUNTER — Encounter (HOSPITAL_COMMUNITY): Payer: Self-pay | Admitting: Emergency Medicine

## 2019-04-15 ENCOUNTER — Inpatient Hospital Stay (HOSPITAL_COMMUNITY)
Admission: EM | Admit: 2019-04-15 | Discharge: 2019-04-18 | DRG: 638 | Disposition: A | Discharge status: Home / Self Care | Payer: Medicare Other | Attending: Family Medicine | Admitting: Family Medicine

## 2019-04-15 ENCOUNTER — Emergency Department (HOSPITAL_COMMUNITY): Payer: Medicare Other

## 2019-04-15 ENCOUNTER — Telehealth (INDEPENDENT_AMBULATORY_CARE_PROVIDER_SITE_OTHER): Payer: Medicare Other | Admitting: Family Medicine

## 2019-04-15 DIAGNOSIS — Z6839 Body mass index (BMI) 39.0-39.9, adult: Secondary | ICD-10-CM

## 2019-04-15 DIAGNOSIS — I69354 Hemiplegia and hemiparesis following cerebral infarction affecting left non-dominant side: Secondary | ICD-10-CM

## 2019-04-15 DIAGNOSIS — F419 Anxiety disorder, unspecified: Secondary | ICD-10-CM | POA: Diagnosis present

## 2019-04-15 DIAGNOSIS — E1043 Type 1 diabetes mellitus with diabetic autonomic (poly)neuropathy: Secondary | ICD-10-CM | POA: Diagnosis present

## 2019-04-15 DIAGNOSIS — Z794 Long term (current) use of insulin: Secondary | ICD-10-CM

## 2019-04-15 DIAGNOSIS — N179 Acute kidney failure, unspecified: Secondary | ICD-10-CM | POA: Diagnosis present

## 2019-04-15 DIAGNOSIS — E890 Postprocedural hypothyroidism: Secondary | ICD-10-CM | POA: Diagnosis present

## 2019-04-15 DIAGNOSIS — I4581 Long QT syndrome: Secondary | ICD-10-CM | POA: Diagnosis present

## 2019-04-15 DIAGNOSIS — I5032 Chronic diastolic (congestive) heart failure: Secondary | ICD-10-CM | POA: Diagnosis present

## 2019-04-15 DIAGNOSIS — E876 Hypokalemia: Secondary | ICD-10-CM | POA: Diagnosis present

## 2019-04-15 DIAGNOSIS — Z8349 Family history of other endocrine, nutritional and metabolic diseases: Secondary | ICD-10-CM

## 2019-04-15 DIAGNOSIS — G8929 Other chronic pain: Secondary | ICD-10-CM | POA: Diagnosis present

## 2019-04-15 DIAGNOSIS — Z841 Family history of disorders of kidney and ureter: Secondary | ICD-10-CM

## 2019-04-15 DIAGNOSIS — E785 Hyperlipidemia, unspecified: Secondary | ICD-10-CM | POA: Diagnosis present

## 2019-04-15 DIAGNOSIS — J029 Acute pharyngitis, unspecified: Secondary | ICD-10-CM | POA: Diagnosis not present

## 2019-04-15 DIAGNOSIS — Z95818 Presence of other cardiac implants and grafts: Secondary | ICD-10-CM

## 2019-04-15 DIAGNOSIS — R112 Nausea with vomiting, unspecified: Secondary | ICD-10-CM | POA: Diagnosis present

## 2019-04-15 DIAGNOSIS — F329 Major depressive disorder, single episode, unspecified: Secondary | ICD-10-CM | POA: Diagnosis present

## 2019-04-15 DIAGNOSIS — K567 Ileus, unspecified: Secondary | ICD-10-CM | POA: Diagnosis present

## 2019-04-15 DIAGNOSIS — I110 Hypertensive heart disease with heart failure: Secondary | ICD-10-CM | POA: Diagnosis present

## 2019-04-15 DIAGNOSIS — M549 Dorsalgia, unspecified: Secondary | ICD-10-CM | POA: Diagnosis present

## 2019-04-15 DIAGNOSIS — Z7901 Long term (current) use of anticoagulants: Secondary | ICD-10-CM

## 2019-04-15 DIAGNOSIS — A084 Viral intestinal infection, unspecified: Secondary | ICD-10-CM

## 2019-04-15 DIAGNOSIS — E860 Dehydration: Secondary | ICD-10-CM | POA: Diagnosis present

## 2019-04-15 DIAGNOSIS — Z791 Long term (current) use of non-steroidal anti-inflammatories (NSAID): Secondary | ICD-10-CM

## 2019-04-15 DIAGNOSIS — E1010 Type 1 diabetes mellitus with ketoacidosis without coma: Secondary | ICD-10-CM

## 2019-04-15 DIAGNOSIS — J302 Other seasonal allergic rhinitis: Secondary | ICD-10-CM | POA: Diagnosis present

## 2019-04-15 DIAGNOSIS — Z85850 Personal history of malignant neoplasm of thyroid: Secondary | ICD-10-CM

## 2019-04-15 DIAGNOSIS — I4891 Unspecified atrial fibrillation: Secondary | ICD-10-CM | POA: Diagnosis present

## 2019-04-15 DIAGNOSIS — Z823 Family history of stroke: Secondary | ICD-10-CM

## 2019-04-15 DIAGNOSIS — K219 Gastro-esophageal reflux disease without esophagitis: Secondary | ICD-10-CM | POA: Diagnosis present

## 2019-04-15 DIAGNOSIS — Z833 Family history of diabetes mellitus: Secondary | ICD-10-CM

## 2019-04-15 DIAGNOSIS — K3184 Gastroparesis: Secondary | ICD-10-CM | POA: Diagnosis present

## 2019-04-15 DIAGNOSIS — Z90711 Acquired absence of uterus with remaining cervical stump: Secondary | ICD-10-CM

## 2019-04-15 DIAGNOSIS — Z9641 Presence of insulin pump (external) (internal): Secondary | ICD-10-CM | POA: Diagnosis not present

## 2019-04-15 DIAGNOSIS — Z8249 Family history of ischemic heart disease and other diseases of the circulatory system: Secondary | ICD-10-CM

## 2019-04-15 DIAGNOSIS — M1990 Unspecified osteoarthritis, unspecified site: Secondary | ICD-10-CM | POA: Diagnosis present

## 2019-04-15 DIAGNOSIS — Z20828 Contact with and (suspected) exposure to other viral communicable diseases: Secondary | ICD-10-CM | POA: Diagnosis present

## 2019-04-15 DIAGNOSIS — Z91040 Latex allergy status: Secondary | ICD-10-CM

## 2019-04-15 DIAGNOSIS — Z79890 Hormone replacement therapy (postmenopausal): Secondary | ICD-10-CM

## 2019-04-15 DIAGNOSIS — Z9089 Acquired absence of other organs: Secondary | ICD-10-CM

## 2019-04-15 DIAGNOSIS — Z79899 Other long term (current) drug therapy: Secondary | ICD-10-CM

## 2019-04-15 LAB — CBC WITH DIFFERENTIAL/PLATELET
Abs Immature Granulocytes: 0.06 10*3/uL (ref 0.00–0.07)
Basophils Absolute: 0 10*3/uL (ref 0.0–0.1)
Basophils Relative: 0 %
Eosinophils Absolute: 0 10*3/uL (ref 0.0–0.5)
Eosinophils Relative: 0 %
HCT: 42.2 % (ref 36.0–46.0)
Hemoglobin: 13.1 g/dL (ref 12.0–15.0)
Immature Granulocytes: 1 %
Lymphocytes Relative: 16 %
Lymphs Abs: 1.2 10*3/uL (ref 0.7–4.0)
MCH: 27.7 pg (ref 26.0–34.0)
MCHC: 31 g/dL (ref 30.0–36.0)
MCV: 89.2 fL (ref 80.0–100.0)
Monocytes Absolute: 0.7 10*3/uL (ref 0.1–1.0)
Monocytes Relative: 10 %
Neutro Abs: 5.1 10*3/uL (ref 1.7–7.7)
Neutrophils Relative %: 73 %
Platelets: 244 10*3/uL (ref 150–400)
RBC: 4.73 MIL/uL (ref 3.87–5.11)
RDW: 13.9 % (ref 11.5–15.5)
WBC: 7 10*3/uL (ref 4.0–10.5)
nRBC: 0 % (ref 0.0–0.2)

## 2019-04-15 LAB — BASIC METABOLIC PANEL
Anion gap: 12 (ref 5–15)
Anion gap: 10 (ref 5–15)
BUN: 34 mg/dL — ABNORMAL HIGH (ref 8–23)
BUN: 34 mg/dL — ABNORMAL HIGH (ref 8–23)
CO2: 16 mmol/L — ABNORMAL LOW (ref 22–32)
CO2: 17 mmol/L — ABNORMAL LOW (ref 22–32)
Calcium: 8.4 mg/dL — ABNORMAL LOW (ref 8.9–10.3)
Calcium: 8.4 mg/dL — ABNORMAL LOW (ref 8.9–10.3)
Chloride: 110 mmol/L (ref 98–111)
Chloride: 111 mmol/L (ref 98–111)
Creatinine, Ser: 1.91 mg/dL — ABNORMAL HIGH (ref 0.44–1.00)
Creatinine, Ser: 1.84 mg/dL — ABNORMAL HIGH (ref 0.44–1.00)
GFR calc Af Amer: 31 mL/min — ABNORMAL LOW (ref >60)
GFR calc Af Amer: 32 mL/min — ABNORMAL LOW (ref >60)
GFR calc non Af Amer: 26 mL/min — ABNORMAL LOW (ref >60)
GFR calc non Af Amer: 28 mL/min — ABNORMAL LOW (ref >60)
Glucose, Bld: 179 mg/dL — ABNORMAL HIGH (ref 70–99)
Glucose, Bld: 142 mg/dL — ABNORMAL HIGH (ref 70–99)
Potassium: 2.9 mmol/L — ABNORMAL LOW (ref 3.5–5.1)
Potassium: 2.9 mmol/L — ABNORMAL LOW (ref 3.5–5.1)
Sodium: 138 mmol/L (ref 135–145)
Sodium: 138 mmol/L (ref 135–145)

## 2019-04-15 LAB — POCT I-STAT EG7
Acid-base deficit: 10 mmol/L — ABNORMAL HIGH (ref 0.0–2.0)
Bicarbonate: 15.3 mmol/L — ABNORMAL LOW (ref 20.0–28.0)
Calcium, Ion: 1.04 mmol/L — ABNORMAL LOW (ref 1.15–1.40)
HCT: 44 % (ref 36.0–46.0)
Hemoglobin: 15 g/dL (ref 12.0–15.0)
O2 Saturation: 50 %
Potassium: 3.1 mmol/L — ABNORMAL LOW (ref 3.5–5.1)
Sodium: 135 mmol/L (ref 135–145)
TCO2: 16 mmol/L — ABNORMAL LOW (ref 22–32)
pCO2, Ven: 31.3 mmHg — ABNORMAL LOW (ref 44.0–60.0)
pH, Ven: 7.298 (ref 7.250–7.430)
pO2, Ven: 29 mmHg — CL (ref 32.0–45.0)

## 2019-04-15 LAB — COMPREHENSIVE METABOLIC PANEL
ALT: 19 U/L (ref 0–44)
AST: 16 U/L (ref 15–41)
Albumin: 3.4 g/dL — ABNORMAL LOW (ref 3.5–5.0)
Alkaline Phosphatase: 94 U/L (ref 38–126)
Anion gap: 19 — ABNORMAL HIGH (ref 5–15)
BUN: 34 mg/dL — ABNORMAL HIGH (ref 8–23)
CO2: 14 mmol/L — ABNORMAL LOW (ref 22–32)
Calcium: 9.2 mg/dL (ref 8.9–10.3)
Chloride: 101 mmol/L (ref 98–111)
Creatinine, Ser: 2.41 mg/dL — ABNORMAL HIGH (ref 0.44–1.00)
GFR calc Af Amer: 23 mL/min — ABNORMAL LOW (ref >60)
GFR calc non Af Amer: 20 mL/min — ABNORMAL LOW (ref >60)
Glucose, Bld: 388 mg/dL — ABNORMAL HIGH (ref 70–99)
Potassium: 3.1 mmol/L — ABNORMAL LOW (ref 3.5–5.1)
Sodium: 134 mmol/L — ABNORMAL LOW (ref 135–145)
Total Bilirubin: 0.7 mg/dL (ref 0.3–1.2)
Total Protein: 7.6 g/dL (ref 6.5–8.1)

## 2019-04-15 LAB — CBG MONITORING, ED
Glucose-Capillary: 304 mg/dL — ABNORMAL HIGH (ref 70–99)
Glucose-Capillary: 294 mg/dL — ABNORMAL HIGH (ref 70–99)
Glucose-Capillary: 237 mg/dL — ABNORMAL HIGH (ref 70–99)

## 2019-04-15 LAB — GLUCOSE, CAPILLARY
Glucose-Capillary: 176 mg/dL — ABNORMAL HIGH (ref 70–99)
Glucose-Capillary: 128 mg/dL — ABNORMAL HIGH (ref 70–99)
Glucose-Capillary: 92 mg/dL (ref 70–99)
Glucose-Capillary: 109 mg/dL — ABNORMAL HIGH (ref 70–99)
Glucose-Capillary: 131 mg/dL — ABNORMAL HIGH (ref 70–99)
Glucose-Capillary: 188 mg/dL — ABNORMAL HIGH (ref 70–99)

## 2019-04-15 LAB — MAGNESIUM: Magnesium: 1.5 mg/dL — ABNORMAL LOW (ref 1.7–2.4)

## 2019-04-15 LAB — HEMOGLOBIN A1C
Hgb A1c MFr Bld: 9.3 % — ABNORMAL HIGH (ref 4.8–5.6)
Mean Plasma Glucose: 220.21 mg/dL

## 2019-04-15 LAB — MRSA PCR SCREENING: MRSA by PCR: NEGATIVE

## 2019-04-15 LAB — TROPONIN I: Troponin I: 0.03 ng/mL (ref <0.03)

## 2019-04-15 LAB — LIPASE, BLOOD: Lipase: 16 U/L (ref 11–51)

## 2019-04-15 MED ORDER — PROMETHAZINE HCL 25 MG PO TABS
12.5000 mg | ORAL_TABLET | Freq: Four times a day (QID) | ORAL | Status: DC | PRN
  Filled 2019-04-15: qty 1

## 2019-04-15 MED ORDER — POTASSIUM CHLORIDE 10 MEQ/100ML IV SOLN
10.0000 meq | INTRAVENOUS | Status: AC
  Administered 2019-04-15 – 2019-04-16 (×6): 10 meq via INTRAVENOUS
  Filled 2019-04-15 (×6): qty 100

## 2019-04-15 MED ORDER — DEXTROSE 50 % IV SOLN
1.0000 | Freq: Once | INTRAVENOUS | Status: AC | PRN
  Administered 2019-04-16: 50 mL via INTRAVENOUS
  Filled 2019-04-15: qty 50

## 2019-04-15 MED ORDER — PROMETHAZINE HCL 25 MG/ML IJ SOLN
12.5000 mg | Freq: Four times a day (QID) | INTRAMUSCULAR | Status: DC | PRN
  Administered 2019-04-15: 12.5 mg via INTRAVENOUS
  Filled 2019-04-15: qty 1

## 2019-04-15 MED ORDER — SODIUM CHLORIDE 0.9 % IV SOLN
INTRAVENOUS | Status: DC
  Administered 2019-04-15 – 2019-04-17 (×5): via INTRAVENOUS

## 2019-04-15 MED ORDER — POTASSIUM CHLORIDE 10 MEQ/100ML IV SOLN
10.0000 meq | INTRAVENOUS | Status: AC
  Administered 2019-04-15 (×4): 10 meq via INTRAVENOUS
  Filled 2019-04-15 (×4): qty 100

## 2019-04-15 MED ORDER — SODIUM CHLORIDE 0.9 % IV BOLUS
1000.0000 mL | Freq: Once | INTRAVENOUS | Status: AC
  Administered 2019-04-15: 11:00:00 1000 mL via INTRAVENOUS

## 2019-04-15 MED ORDER — SODIUM CHLORIDE 0.9 % IV BOLUS
1000.0000 mL | Freq: Once | INTRAVENOUS | Status: AC
  Administered 2019-04-15: 1000 mL via INTRAVENOUS

## 2019-04-15 MED ORDER — SODIUM CHLORIDE 0.9 % IV BOLUS
1000.0000 mL | Freq: Once | INTRAVENOUS | Status: AC
  Administered 2019-04-15: 16:00:00 1000 mL via INTRAVENOUS

## 2019-04-15 MED ORDER — ENOXAPARIN SODIUM 120 MG/0.8ML ~~LOC~~ SOLN
1.0000 mg/kg | SUBCUTANEOUS | Status: DC
  Administered 2019-04-15: 115 mg via SUBCUTANEOUS
  Filled 2019-04-15 (×2): qty 0.76

## 2019-04-15 MED ORDER — ACETAMINOPHEN 325 MG PO TABS
650.0000 mg | ORAL_TABLET | Freq: Four times a day (QID) | ORAL | Status: DC | PRN
  Administered 2019-04-16 (×2): 650 mg via ORAL
  Filled 2019-04-15 (×2): qty 2

## 2019-04-15 MED ORDER — ACETAMINOPHEN 650 MG RE SUPP: 650.0000 mg | Freq: Four times a day (QID) | RECTAL | Status: DC | PRN

## 2019-04-15 MED ORDER — INSULIN REGULAR(HUMAN) IN NACL 100-0.9 UT/100ML-% IV SOLN
INTRAVENOUS | Status: DC
  Administered 2019-04-15: 14:00:00 2.4 [IU]/h via INTRAVENOUS
  Filled 2019-04-15 (×2): qty 100

## 2019-04-15 MED ORDER — DEXTROSE-NACL 5-0.45 % IV SOLN
INTRAVENOUS | Status: DC
  Administered 2019-04-15 – 2019-04-16 (×2): via INTRAVENOUS

## 2019-04-15 NOTE — ED Notes (Signed)
Pt not in room when RN went to draw venous blood gas

## 2019-04-15 NOTE — ED Notes (Signed)
CBG collected. Result "[id number]." RN, Emmy, notified.

## 2019-04-15 NOTE — H&P (Addendum)
[facility] Admission History and Physical Service Pager: [phone]  Patient name: [name] Medical record number: [id number] Date of birth: 07-Feb-1951 Age: 68 y.o. Gender: female  Primary Care Provider: Shirley, [name], DO Consultants: None Code Status: Full  Chief Complaint: Persistent nausea, vomiting, diarrhea  Assessment and Plan: [name] is a 68 y.o. female presenting with five days of nausea, vomiting, and diarrhea . PMH is significant for atrial fibrillation, T1DM on insulin pump, multiple strokes, hypothyroidism 2/2 thyroidectomy for thyroid cancer, HTN, HLD, GERD, depression/anxiety, chronic back pain.   Metabolic Acidosis likely 2/2 to dehydration and mild DKA:  Five day history of nausea, vomiting and diarrhea leading to metabolic acidosis, dehydration, and mild DKA. Labs significant for normal CBC without leukocytosis, glucose 388, anion gap 19, bicarb 14, Na 134 (corrected Na 139), K 3.1. Persistent nausea/vomiting could be due to ileus from viral gastroenteritis (although no sick contacts at home) vs gastroparesis however some concern for partial SBO given reported abdominal pain and history of abdominal surgeries (abdominal partial hysterectomy in 1995). Expect worsening of ileus from reported treatment with imodium as well. Uremia may be worsening symptoms (BUN 34) and contributing to metabolic acidosis as well. Not likely pancreatitis given location of her pain and normal lipase. No evidence of diverticulitis, cholecystitis, or acute appendicitis on CT abdomen/pelvis. Normal liver enzymes and alk phos as well making hepatic or biliary origin less likely. CXR with possible lingular atelectasis vs infiltrate, however patient denies any cough, fever, or SOB thus making PNA less likely. Of note, patient has had multiple ED visits as well as a recent hospitalization in Dec. 2019 for the exact same symptoms with negative  workup, diagnosed with gastroparesis and treated with IV fluids. She was referred to GI for further work up but never had follow up. In the ED, patient was afebrile and hemodynamically stable. She received 3L bolus NS, K supplementation, and started on glucose stabilizer.  - Admit to Step down, FPTS2, attending Dr. Andria Frames - Glucose stabilizer - Hold insulin pump while on insulin drip - NPO until off insulin drip, then advance diet as tolerated - Follow up CBC, BMP, Mag, Hgb A1C, urinalysis - Consult surgery in AM to make aware of concern for partial SBO - vitals per protocol - continuous cardiac monitoring and pulse ox - Tylenol PRN - Phenergan 12.5m for nausea - BMP q4h until acidosis clears  AKI 2/2 Dehydration: Cr 2.41 (baseline 1.0) and BUN 34. S/p 3L NS bolus in ED.  - continue maintenance fluids with glucose stabilizer - follow up BMP   Hypokalemia: K 3.1. Likely 2/2 vomiting, dehydration, and DKA. S/p 40 mEq K+ in ED.  - follow up BMP this evening - replace as needed - continue to monitor with AM BMP  Atrial Fibrillation  H/o multiple CVAs: Most recent CVA on 10/13/18 documented as "cryptogenic stroke". Has residual left sided weakness. Diagnosed with a. Fib on 02/09/19 by loop recorder. Followed by cardiology Dr. FMarlene Lard EKG notes a.fib however p-waves are appreciated. Appears more like sinus arrhythmia. HR 80's on admission. Home meds: Eliquis 525mBID, atorvastatin and toprol 1003mD.  - Hold home meds while NPO - monitor HR - Begin SQ Lovenox for anticoagulation  Hypothyroidism: S/p Thyroidectomy in 2006 for thyroid cancer. Last TSH 2.479 (11/25/18). Home meds: Synthroid 175m69mD. - hold home meds while NPO  T1DM: Home meds include: insulin pump.  CBG on admission 304. Sees Dr. BallDebbora Presto Endocrinology. CBGs have  been <200 every day.  - Hold home insulin pump while on insulin gtt - consult diabetic educator when ready to transition to home insulin  pump  HTN: Normotensive on admission. Home meds include: Losartan 192m QD.  - monitor BP  HLD:  Last lipid panel: Chol 107, HDL 48, LDL 48, Trig 53. Home meds: Atorvastatin 866mQD. - hold home meds while NPO  H/o HFpEF: Most recent Echo on 10/16/18 with EF 60-65% without any wall motion abnormalities. This is improved from last Echo in 2016 that noted EF 55-60%. Home meds include: Torsemide 1071mRN for swelling, has not used in the last month. Dry weight 143 per patient.  - Hold home meds while NPO  Chronic Back Pain: Home meds include: Voltaren 1% gel, Gabapentin 300m49mS. - Hold home meds while NPO  GERD: Home meds: Protonix 40mg62m- Consider IV protonix while NPO  Depression/Anxiety: Treated for depression/anxiety since stroke. Home meds: Prozac 10mg 52m- hold home meds while NPO  Concern for Thrush: H/o of esophageal candidiasis in Dec. 2019. Yellowish-white overlying film on tongue, unable to be scraped off. Likely discoloration from repeated vomiting. Low concern for candida. Denies any mouth pain. - Will not treat with antifungal at this time  FEN/GI: NPO while on insulin drip Prophylaxis: SQ full dose Lovenox for A-fib  Disposition: admit to progressive, attending Dr. HenselAndria Framestory of Present Illness:  [name] EBreelle Hollywood68 y.o22female presenting with N/V/D since Thursday and lower quadrant abdominal pain for the past couple of days. She called our on-call line yesterday and received zofran which hasn't helped.  States diarrhea started first, took some imodium and has now stopped. Then she started throwing up every other day but then got more frequent and now she can't hold anything down. Just vomited water in the room during exam. States vomiting has been greenish. Sister had a cold a few days ago but no one has had vomiting or diarrhea. Is around granddaughter (age 47) an22sister, both of which have not had any similar symptoms. Sister has been  giving her the BRAT dMolson Coors Brewingginger ale. Denies new foods prior to this. Denies fevers or chills. States sister said she was panting earlier today, otherwise no worsening SOB, cough or sore throat. She has not urinated much in the past few days. Denies dysuria. She has been checking her sugars throughout this without noted abnormalities. Last BM was Sunday. Also endorsing discolored tongue, but no pain. States she has been having shortness of breath since March, more exacerbated with lying down. Denies missed doses of medications but is unsure if she vomited any up. Denies any blood in vomit or stool. Denies NSAID use. Denies alcohol use, last drink 20 years ago. Denies illicit drug use. No tobacco use. Patient notes her dry weight is 143.  In the ED, patient was hemodynamically stable and afebrile. She was actively vomiting during exam. CBC WNL with WBC 7.0. CMP notable for Na 134 (corrected 139), K 3.1, Bicarb 14, Glucose 388, BUN 34, anion gap 19, Alk phos 94. Lipase 16. AST/ALT WNL.Troponin 0.03. EKG with a.fib. CT abdomen/pelvis with concern for ileus vs partial SBO. CXR with atelectasis vs infiltrate in left lingula.  Review Of Systems: Per HPI with the following additions:   Review of Systems  Constitutional: Negative for chills and fever.  HENT: Negative for sore throat.   Respiratory: Positive for shortness of breath. Negative for cough.   Cardiovascular: Negative for  chest pain, palpitations and orthopnea.  Gastrointestinal: Positive for abdominal pain, diarrhea, nausea and vomiting.  Genitourinary: Negative for dysuria and hematuria.  Neurological: Positive for headaches. Negative for dizziness.    Patient Active Problem List   Diagnosis Date Noted  . Viral gastroenteritis 04/15/2019  . Nausea and vomiting 04/15/2019  . (HFpEF) heart failure with preserved ejection fraction ([place]) 03/17/2019  . Hot flashes 12/11/2018  . 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 Abnormal  echocardiogram   . Stroke (cerebrum) ([place]) 10/13/2018  . Frequent PVCs   . Uncontrolled type 1 diabetes mellitus ([place])   . BPPV (benign paroxysmal positional vertigo), right 06/13/2017  . Premature atrial beats 06/13/2017  . Snoring 07/31/2016  . Pre-ulcerative corn or callous 12/15/2015  . History of CHF (congestive heart failure) 10/29/2015  . Essential hypertension 10/29/2015  . Cerebrovascular accident (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 ([place]) 10/29/2015  . Intracranial vascular stenosis   . HLD (hyperlipidemia)   . Cerebral infarction due to thrombosis of basilar artery ([name])   . CVA (cerebral vascular accident) ([place]) 08/29/2015  . Morbid obesity ([place]) 05/29/2013  . Back pain without radiation 04/28/2013  . Nail dystrophy 01/10/2013  . Diabetes mellitus ([place]) 01/25/2012  . Hyperlipidemia 01/25/2012  . Hypothyroidism 01/25/2012  . Hypertension 01/25/2012  . GERD (gastroesophageal reflux disease) 01/25/2012    Past Medical History: Past Medical History:  Diagnosis Date  . Anemia   . Arthritis   . CHF (congestive heart failure) ([place])   . Depression   . GERD (gastroesophageal reflux disease)   . Hemorrhoids   . Hyperlipidemia   . Hypertension   . Hypothyroidism   . Seasonal allergies   . Stroke ([place])   . Thyroid cancer ([place])    per pt S/p Total Thyroidectomy with Radioactive Iodine Therapy  . Type 1 diabetes mellitus ([place])     Past Surgical History: Past Surgical History:  Procedure Laterality Date  . ABDOMINAL ANGIOGRAM N/A 05/14/2012   Procedure: ABDOMINAL ANGIOGRAM;  Surgeon: Laverda Page, MD;  Location: [facility] CATH LAB;  Service: Cardiovascular;  Laterality: N/A;  . ABDOMINAL HYSTERECTOMY  1995   partial  . ANKLE FRACTURE SURGERY Right   . CARDIAC CATHETERIZATION     with coronary angiogram  . COLONOSCOPY N/A 09/09/2014   Procedure: COLONOSCOPY;  Surgeon: Gatha Mayer, MD;  Location: [facility];  Service: Endoscopy;  Laterality: N/A;  .  ESOPHAGOGASTRODUODENOSCOPY (EGD) WITH PROPOFOL N/A 12/03/2018   Procedure: ESOPHAGOGASTRODUODENOSCOPY (EGD) WITH PROPOFOL;  Surgeon: Doran Stabler, MD;  Location: [facility];  Service: Gastroenterology;  Laterality: N/A;  . LEFT AND RIGHT HEART CATHETERIZATION WITH CORONARY ANGIOGRAM N/A 05/14/2012   Procedure: LEFT AND RIGHT HEART CATHETERIZATION WITH CORONARY ANGIOGRAM;  Surgeon: Laverda Page, MD;  Location: [facility] CATH LAB;  Service: Cardiovascular;  Laterality: N/A;  . LOOP RECORDER INSERTION N/A 12/23/2018   Procedure: LOOP RECORDER INSERTION;  Surgeon: Constance Haw, MD;  Location: [facility] CV LAB;  Service: Cardiovascular;  Laterality: N/A;  . MINI METER   09/08/2014   ELECTRONIC INSULIN PUMP  . TEE WITHOUT CARDIOVERSION N/A 10/16/2018   Procedure: TRANSESOPHAGEAL ECHOCARDIOGRAM (TEE);  Surgeon: Buford Dresser, MD;  Location[facility] ENDOSCOPY;  Service: Cardiovascular;  Laterality: N/A;  . THYROIDECTOMY  2006    Social History: Social History   Tobacco Use  . Smoking status: Never Smoker  . Smokeless tobacco: Never Used  Substance Use Topics  . Alcohol use: No  . Drug use: No  Additional social history: Lives with granddaughter, sister, and daughter. Denies smoking, drinking, or illicit drugs. Please also refer to relevant sections of EMR.  Family History: Family History  Problem Relation Age of Onset  . Heart disease Mother   . Hyperlipidemia Mother   . Hypertension Mother   . Renal Disease Mother        insufficiency  . Heart attack Mother        in his 78's  . Early death Father        Head Injury at Work  . Hyperlipidemia Father   . Hypertension Father   . Stroke Son   . Diabetes Maternal Aunt   . Prostate cancer Paternal Uncle   . Heart disease Maternal Aunt   . Heart failure Maternal Grandfather   . Heart disease Maternal Grandfather   . Heart attack Maternal Grandfather        Died in his 29's  . Breast cancer Neg Hx     Allergies  and Medications: Allergies  Allergen Reactions  . Latex Rash   No current facility-administered medications on file prior to encounter.    Current Outpatient Medications on File Prior to Encounter  Medication Sig Dispense Refill  . acetaminophen (TYLENOL) 500 MG tablet Take 1,000 mg by mouth as needed for mild pain.    Marland Kitchen apixaban (ELIQUIS) 5 MG TABS tablet Take 1 tablet (5 mg total) by mouth 2 (two) times daily. 180 tablet 2  . atorvastatin (LIPITOR) 80 MG tablet Take 1 tablet (80 mg total) by mouth daily. 90 tablet 2  . CRANBERRY EXTRACT PO Take 1 capsule by mouth daily.    . diclofenac sodium (VOLTAREN) 1 % GEL Apply 4 g topically 4 (four) times daily. 100 g 2  . fexofenadine (ALLEGRA) 30 MG tablet Take 30 mg by mouth daily as needed (allergies).     Marland Kitchen FLUoxetine (PROZAC) 10 MG capsule Take 1 capsule (10 mg total) by mouth daily. 90 capsule 1  . gabapentin (NEURONTIN) 300 MG capsule Take 1 capsule (300 mg total) by mouth at bedtime. 90 capsule 3  . Insulin Human (INSULIN PUMP) SOLN Inject into the skin continuous. humalog insulin    . levothyroxine (SYNTHROID, LEVOTHROID) 175 MCG tablet Take 175 mcg by mouth daily before breakfast.   12  . loperamide (IMODIUM A-D) 2 MG tablet Take 1 tablet (2 mg total) by mouth 4 (four) times daily as needed for diarrhea or loose stools. 30 tablet 0  . losartan (COZAAR) 100 MG tablet Take 1 tablet (100 mg total) by mouth daily. 90 tablet 3  . metoprolol succinate (TOPROL-XL) 100 MG 24 hr tablet Take 1 tablet by mouth daily (Patient taking differently: Take 100 mg by mouth daily. ) 90 tablet 1  . ondansetron (ZOFRAN ODT) 4 MG disintegrating tablet Take 1 tablet (4 mg total) by mouth every 8 (eight) hours as needed for nausea or vomiting. 20 tablet 0  . pantoprazole (PROTONIX) 40 MG tablet Take 1 tablet (40 mg total) by mouth daily. 90 tablet 0  . potassium chloride (K-DUR) 10 MEQ tablet Take 1 tablet by mouth on days toresmide is taken 30 tablet 2  .  torsemide (DEMADEX) 10 MG tablet TAKE 1 TABLET BY MOUTH DAILY AS NEEDED FOR FLUID (Patient taking differently: Take 10 mg by mouth daily as needed (fluid). ) 90 tablet 0    Objective: BP 119/64   Pulse (!) 109   Temp (!) 97.5 F (36.4 C) (Oral)   Resp Marland Kitchen)  22   Ht 5' 7"  (1.702 m)   Wt 113.9 kg   SpO2 95%   BMI 39.33 kg/m  Exam: General: well nourished, well developed, obese, in no acute distress with non-toxic appearance, actively vomiting when entering exam although improved throughout exam HEENT: normocephalic, atraumatic, dry mucous membranes with yellow-white film on tongue unable to be scraped off, oropharynx erythematous but no exudate appreciated, PERRLA, no scleral icterus Neck: supple, normal ROM CV: regular rate but irregular rhythm without murmurs, rubs, or gallops, no lower extremity edema, 2+ radial pulses bilaterally Lungs: clear to auscultation bilaterally with normal work of breathing on RA, talking in full sentences  Abdomen: soft, non-tender, non-distended, no masses or organomegaly palpable, underactive bowel sounds Skin: warm, dry, no rashes or lesions Extremities: warm and well perfused Neuro: Alert and oriented, speech normal  Labs and Imaging: CBC BMET  Recent Labs  Lab 04/15/19 0958 04/15/19 1333  WBC 7.0  --   HGB 13.1 15.0  HCT 42.2 44.0  PLT 244  --    Recent Labs  Lab 04/15/19 0958 04/15/19 1333  NA 134* 135  K 3.1* 3.1*  CL 101  --   CO2 14*  --   BUN 34*  --   CREATININE 2.41*  --   GLUCOSE 388*  --   CALCIUM 9.2  --      Lipase: 16 Troponin: 0.03 I stat VBG: pH 7.298, pCO2 31.3, pO2: 29, Bicarb 15.3  Ct Abdomen Pelvis Wo Contrast  Result Date: 04/15/2019 CLINICAL DATA:  Acute onset of nausea, vomiting and diarrhea that began 5 days ago, associated with LEFT LOWER QUADRANT abdominal pain. Surgical history includes hysterectomy. EXAM: CT ABDOMEN AND PELVIS WITHOUT CONTRAST TECHNIQUE: Multidetector CT imaging of the abdomen and pelvis  was performed following the standard protocol without IV contrast. Oral contrast was administered, though the patient vomited the majority of the contrast. COMPARISON:  11/16/2018. FINDINGS: Lower chest: Linear atelectasis involving the RIGHT MIDDLE LOBE, lingula and to a lesser degree the BILATERAL lower lobes. Heart mildly enlarged. Severe three-vessel coronary atherosclerosis. Hepatobiliary: Normal unenhanced appearance of the liver. Borderline gallbladder distention without evidence of cholelithiasis or cholecystitis. No biliary ductal dilation. Pancreas: Atrophic as noted previously. No mass or peripancreatic inflammation. Spleen: Normal unenhanced appearance. Adrenals/Urinary Tract: Normal appearing adrenal glands. No evidence of urinary tract calculi. Within the limits of the unenhanced technique, no focal parenchymal abnormality involving either kidney. No evidence of hydronephrosis involving either kidney. Normal appearing urinary bladder. Stomach/Bowel: Stomach normal in appearance for the degree of distention. Moderate diffuse small bowel distension without evidence of a transition site. Mild distension of the ascending and transverse colon, with the descending colon, sigmoid colon and rectum decompressed. Liquid stool in the colon. Postsurgical changes adjacent to the cecum, including minimal edema/fluid in the RIGHT paracolic gutter. No evidence of free intraperitoneal air. Vascular/Lymphatic: Mild-to-moderate aortoiliofemoral and visceral artery atherosclerosis without evidence of aneurysm. No pathologic lymphadenopathy. Reproductive: Surgically absent uterus. No adnexal masses. Other: Small amount of free fluid dependently in the pelvis. Small umbilical hernia containing fat. Musculoskeletal: Multilevel degenerative disc disease, spondylosis and facet degenerative changes throughout the lumbar spine. Degenerative disc disease throughout the visualized thoracic spine. Compression fracture of the UPPER  endplate of L2, unchanged since the prior CT. Mild compression fracture of the LOWER endplate of T9, new since the prior examination. Mild degenerative changes involving both hips. IMPRESSION: 1. Moderate diffuse small bowel distension without evidence of a transition point, and mild distension of the ascending  and transverse colon. Ileus is favored over bowel obstruction. 2. Small amount of ascites dependently in the pelvis. 3. Linear atelectasis involving the visualized lung bases. 4. Borderline gallbladder distention without evidence of cholelithiasis or cholecystitis. 5.  Aortic Atherosclerosis (ICD10-170.0) Electronically Signed   By: Evangeline Dakin M.D.   On: 04/15/2019 13:46   Dg Chest 2 View  Result Date: 04/15/2019 CLINICAL DATA:  Pt here from home with c/o n/v since Thursday, pt also c/o sob and a sore throat.Shortness of breath today EXAM: CHEST - 2 VIEW COMPARISON:  07/03/2011 FINDINGS: Normal cardiac silhouette. Small linear opacity adjacent to the LEFT heart border within the lingula. LEFT upper lobe clear. RIGHT lung clear. No pneumothorax. No pleural fluid. IMPRESSION: Atelectasis versus infiltrate in the lingular lobe. Electronically Signed   By: Suzy Bouchard M.D.   On: 04/15/2019 10:27   Danna Hefty, DO 04/15/2019, 6:32 PM PGY-1, [facility] pager: [phone], text pages welcome  FPTS Upper-Level Resident Addendum   I have independently interviewed and examined the patient. I have discussed the above with the original author and agree with their documentation. My edits for correction/addition/clarification are in green. Please see also any attending notes.    Rory Percy, DO PGY-2, [facility] 04/15/2019 7:03 PM  [facility] pager: [phone] (text pages welcome through [facility])

## 2019-04-15 NOTE — ED Notes (Addendum)
CBG collected. Result "[id number]." RN, Emmy, notified.

## 2019-04-15 NOTE — ED Notes (Signed)
Internal medicine at bedside

## 2019-04-15 NOTE — ED Notes (Signed)
Patient called out stating that she was going to "throw up." Pt did vomit after drinking most of the first bottle of water given to her by CT. Pt stated that she couldn't drink anymore. RN, Lowella Petties, notified.

## 2019-04-15 NOTE — Progress Notes (Signed)
[facility] Telemedicine Visit  Patient consented to have virtual visit. Method of visit: Telephone, attempted video, however patient had technical difficulties getting camera working.  Encounter participants: Patient: [name] - located at home Provider: Patriciaann Clan - located at [facility] clinic Others (if applicable): None  Chief Complaint: N/V/D  HPI: [name] is 68 year old female presenting via telephone to discuss the following:   4 day h/o nausea, vomiting, and diarrhea. Initially vomiting was yellow, now its states it looks more green. Epigastric abdominal pain, sharp, previously cramp-like. Vomiting after attempting eating and drinking. Vomited x2 this am. No watery BM's today, 4/5 episodes yesterday. Abdominal pain kept her awake overnight.  Reports no longer been able to keep down any fluids, except a little water this morning for her medications. Feeling alittle fatigued from vomiting. Tried the zofran prescribed yesterday, didn't help. Denies fever, SOB, sore throat.  Feels her symptoms are significantly worse compared to yesterday and is concerned.  ROS: per HPI  Pertinent PMHx: GERD, diabetes, CVA, hypertension  Exam: Via telephone encounter Respiratory: Able to speak in full sentences, breathing comfortably Sounds anxious on the phone.  Assessment/Plan:  Viral gastroenteritis Vomiting and diarrhea likely secondary to viral gastroenteritis, however with some concerns that patient may be describing bilious emesis which is more concerning for intra-abdominal pathology.  Given her reported worsening and inability to tolerate fluids, highly recommended patient present in person to the ED/urgent care for further evaluation likely including imaging and fluid hydration. She endorsed understanding and has appropriate transportation.    Time spent during visit with patient: 10 minutes  Patriciaann Clan, DO

## 2019-04-15 NOTE — ED Notes (Signed)
Called EDMD for EG7 lab

## 2019-04-15 NOTE — Progress Notes (Signed)
Pt BS 109. This is 3rd BS below 140. Night coverage MD, Winfrey, paged per protocol.

## 2019-04-15 NOTE — Progress Notes (Addendum)
Patient trasfered from ED to 5W16 via stretcher; alert and oriented x 4; no complaints of pain; IV in LAC - insulin drip and RAC running NS and D5 with .45 NS; skin intact, an excoriation on left elbow. Patient has an insulin pump on LLQ (not in use). Orient patient to room and unit; gave patient care guide; instructed how to use the call bell and  fall risk precautions. Will continue to monitor the patient.

## 2019-04-15 NOTE — ED Triage Notes (Signed)
Pt here from home with c/o n/v since Thursday , pt was given some zofran by her primary but states that it has not helped , pt also c/o sob and a sore throat

## 2019-04-15 NOTE — Assessment & Plan Note (Addendum)
Vomiting and diarrhea likely secondary to viral gastroenteritis, however with some concerns that patient may be describing bilious emesis which is more concerning for intra-abdominal pathology.  Given her reported worsening and inability to tolerate fluids, highly recommended patient present in person to the ED/urgent care for further evaluation likely including imaging and fluid hydration. She endorsed understanding and has appropriate transportation.

## 2019-04-15 NOTE — Progress Notes (Signed)
I saw and examined Ms. Chisolm.  I discussed with Drs Ky Barban and Tarry Kos.  We agreed on a management plan.  I will cosign the H&PE when available.  Briefly, 68 yo type 1 DM with N, V, and diarrhea for 5 days.  Issues: 1. Type 1 DM with mild DKA.  She has less hyperglycemia than usual, but does have an anion gap and VBG suggesting metabolic acidosis and compensatory respiratory alkalosis.  Agree with vigorous hydration and insulin drip.  Will need D5 in iv fluids since the hyperglycemia will clear more quickly than the acidosis. 2. Ileus versus partial SBO.  Started with diarhea and cramping mildline lower abd pain.  Now without pain and with absent/hypoactive bowel sounds.  Has had pelvic surg (hysterectomy) so adhesions are possible.  NPO and vigorous hydration.   3. AKI secondary to #2.  Again, hydration.  NPO, insulin drip, hydrate, monitor labs and serial abd exams.  Consider involve surg in AM.  Drop an NG tube if vomiting persists despite NPO.

## 2019-04-15 NOTE — Progress Notes (Signed)
Spoke with night coverage MD, Winfrey. Advised to keep pt on insulin gtt throughout the night so that diabetes coordinator can assess pt tomorrow. Will continue to monitor and treat per MD orders.

## 2019-04-15 NOTE — ED Provider Notes (Signed)
[facility] EMERGENCY DEPARTMENT Provider Note   CSN: [id number] Arrival date & time: 04/15/19  7096    History   Chief Complaint Chief Complaint  Patient presents with   Nausea   Emesis    HPI [name] is a 68 y.o. female with history of type 1 diabetes, stroke, hypothyroidism, hypertension, CHF who presents with a 5-day history of nausea, vomiting, and diarrhea.  She has had associated left lower quadrant pain.  She also reports she started having some shortness of breath today.  She denies any fever, cough, urinary symptoms.  Patient reports she has had a discoloration on her tongue.  She denies any sore throat.  Patient reports she had a tele-health visit and was sent for further evaluation of her symptoms, IV fluids, and assessment of possible thrush.     HPI  Past Medical History:  Diagnosis Date   Anemia    Arthritis    CHF (congestive heart failure) (HCC)    Depression    GERD (gastroesophageal reflux disease)    Hemorrhoids    Hyperlipidemia    Hypertension    Hypothyroidism    Seasonal allergies    Stroke [facility])    Thyroid cancer ([place])    per pt S/p Total Thyroidectomy with Radioactive Iodine Therapy   Type 1 diabetes mellitus ([place])     Patient Active Problem List   Diagnosis Date Noted   Viral gastroenteritis 04/15/2019   (HFpEF) heart failure with preserved ejection fraction ([place]) 03/17/2019   Hot flashes 12/11/2018   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 10/29/2018   Abnormal echocardiogram    Stroke (cerebrum) ([place]) 10/13/2018   Frequent PVCs    Uncontrolled type 1 diabetes mellitus (HCC)    BPPV (benign paroxysmal positional vertigo), right 06/13/2017   Premature atrial beats 06/13/2017   Snoring 07/31/2016   Pre-ulcerative corn or callous 12/15/2015   History of CHF (congestive heart failure) 10/29/2015   Essential hypertension 10/29/2015   Cerebrovascular accident (CVA) due to thrombosis  of basilar artery ([place]) 10/29/2015   Intracranial vascular stenosis    HLD (hyperlipidemia)    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)    CVA (cerebral vascular accident) ([place]) 08/29/2015   Morbid obesity ([place]) 05/29/2013   Back pain without radiation 04/28/2013   Nail dystrophy 01/10/2013   Diabetes mellitus ([place]) 01/25/2012   Hyperlipidemia 01/25/2012   Hypothyroidism 01/25/2012   Hypertension 01/25/2012   GERD (gastroesophageal reflux disease) 01/25/2012    Past Surgical History:  Procedure Laterality Date   ABDOMINAL ANGIOGRAM N/A 05/14/2012   Procedure: ABDOMINAL ANGIOGRAM;  Surgeon: Laverda Page, MD;  Location: [facility] CATH LAB;  Service: Cardiovascular;  Laterality: N/A;   ABDOMINAL HYSTERECTOMY  1995   partial   ANKLE FRACTURE SURGERY Right    CARDIAC CATHETERIZATION     with coronary angiogram   COLONOSCOPY N/A 09/09/2014   Procedure: COLONOSCOPY;  Surgeon: Gatha Mayer, MD;  Location: [place];  Service: Endoscopy;  Laterality: N/A;   ESOPHAGOGASTRODUODENOSCOPY (EGD) WITH PROPOFOL N/A 12/03/2018   Procedure: ESOPHAGOGASTRODUODENOSCOPY (EGD) WITH PROPOFOL;  Surgeon: Doran Stabler, MD;  Location: [place];  Service: Gastroenterology;  Laterality: N/A;   LEFT AND RIGHT HEART CATHETERIZATION WITH CORONARY ANGIOGRAM N/A 05/14/2012   Procedure: LEFT AND RIGHT HEART CATHETERIZATION WITH CORONARY ANGIOGRAM;  Surgeon: Laverda Page, MD;  Location: [facility] CATH LAB;  Service: Cardiovascular;  Laterality: N/A;   LOOP RECORDER INSERTION N/A 12/23/2018   Procedure: LOOP  RECORDER INSERTION;  Surgeon: Constance Haw, MD;  Location: [place] CV LAB;  Service: Cardiovascular;  Laterality: N/A;   MINI METER   09/08/2014   ELECTRONIC INSULIN PUMP   TEE WITHOUT CARDIOVERSION N/A 10/16/2018   Procedure: TRANSESOPHAGEAL ECHOCARDIOGRAM (TEE);  Surgeon: Buford Dresser, MD;  Location: [facility] ENDOSCOPY;  Service: Cardiovascular;   Laterality: N/A;   THYROIDECTOMY  2006     OB History   No obstetric history on file.      Home Medications    Prior to Admission medications   Medication Sig Start Date End Date Taking? Authorizing Provider  acetaminophen (TYLENOL) 500 MG tablet Take 1,000 mg by mouth as needed for mild pain.   Yes [provider]  apixaban (ELIQUIS) 5 MG TABS tablet Take 1 tablet (5 mg total) by mouth 2 (two) times daily. 03/20/19  Yes Fenton, Clint R, PA  atorvastatin (LIPITOR) 80 MG tablet Take 1 tablet (80 mg total) by mouth daily. 02/18/19  Yes Enid Derry, [name], DO  CRANBERRY EXTRACT PO Take 1 capsule by mouth daily.   Yes [provider]  diclofenac sodium (VOLTAREN) 1 % GEL Apply 4 g topically 4 (four) times daily. 11/11/18  Yes Enid Derry, [name], DO  fexofenadine (ALLEGRA) 30 MG tablet Take 30 mg by mouth daily as needed (allergies).    Yes [provider]  FLUoxetine (PROZAC) 10 MG capsule Take 1 capsule (10 mg total) by mouth daily. 01/27/19  Yes Enid Derry, [name], DO  gabapentin (NEURONTIN) 300 MG capsule Take 1 capsule (300 mg total) by mouth at bedtime. 01/08/19  Yes Enid Derry, [name], DO  Insulin Human (INSULIN PUMP) SOLN Inject into the skin continuous. humalog insulin   Yes [provider]  levothyroxine (SYNTHROID, LEVOTHROID) 175 MCG tablet Take 175 mcg by mouth daily before breakfast.  06/06/17  Yes [provider]  loperamide (IMODIUM A-D) 2 MG tablet Take 1 tablet (2 mg total) by mouth 4 (four) times daily as needed for diarrhea or loose stools. 04/14/19  Yes Orson Eva J, DO  losartan (COZAAR) 100 MG tablet Take 1 tablet (100 mg total) by mouth daily. 01/16/19  Yes Enid Derry, [name], DO  metoprolol succinate (TOPROL-XL) 100 MG 24 hr tablet Take 1 tablet by mouth daily Patient taking differently: Take 100 mg by mouth daily.  03/17/19  Yes Enid Derry, [name], DO  ondansetron (ZOFRAN ODT) 4 MG disintegrating tablet Take 1 tablet (4 mg total) by mouth every 8  (eight) hours as needed for nausea or vomiting. 04/14/19  Yes Orson Eva J, DO  pantoprazole (PROTONIX) 40 MG tablet Take 1 tablet (40 mg total) by mouth daily. 12/09/18  Yes Enid Derry, [name], DO  potassium chloride (K-DUR) 10 MEQ tablet Take 1 tablet by mouth on days toresmide is taken 03/20/19  Yes Fenton, Clint R, PA  torsemide (DEMADEX) 10 MG tablet TAKE 1 TABLET BY MOUTH DAILY AS NEEDED FOR FLUID Patient taking differently: Take 10 mg by mouth daily as needed (fluid).  03/14/19  Yes Shirley, [name], DO    Family History Family History  Problem Relation Age of Onset   Heart disease Mother    Hyperlipidemia Mother    Hypertension Mother    Renal Disease Mother        insufficiency   Heart attack Mother        in his 34's   Early death Father        Head Injury at Work   Hyperlipidemia Father    Hypertension Father  Stroke Son    Diabetes Maternal Aunt    Prostate cancer Paternal Uncle    Heart disease Maternal Aunt    Heart failure Maternal Grandfather    Heart disease Maternal Grandfather    Heart attack Maternal Grandfather        Died in his 68's   Breast cancer Neg Hx     Social History Social History   Tobacco Use   Smoking status: Never Smoker   Smokeless tobacco: Never Used  Substance Use Topics   Alcohol use: No   Drug use: No     Allergies   Latex   Review of Systems Review of Systems  Constitutional: Negative for chills and fever.  HENT: Negative for facial swelling, sore throat and trouble swallowing.   Respiratory: Negative for shortness of breath.   Cardiovascular: Negative for chest pain.  Gastrointestinal: Positive for abdominal pain, diarrhea, nausea and vomiting. Negative for blood in stool.  Genitourinary: Negative for dysuria.  Musculoskeletal: Negative for back pain.  Skin: Negative for rash and wound.  Neurological: Negative for headaches.  Psychiatric/Behavioral: The patient is not nervous/anxious.      Physical  Exam Updated Vital Signs BP (!) 122/58    Pulse (!) 39    Temp (!) 97.5 F (36.4 C) (Oral)    Resp 18    SpO2 99%   Physical Exam Vitals signs and nursing note reviewed.  Constitutional:      General: She is not in acute distress.    Appearance: She is well-developed. She is not diaphoretic.  HENT:     Head: Normocephalic and atraumatic.     Mouth/Throat:     Pharynx: No oropharyngeal exudate.     Comments: Yellow coating to the tongue, minimally scrapes off with tongue depressor Eyes:     General: No scleral icterus.       Right eye: No discharge.        Left eye: No discharge.     Conjunctiva/sclera: Conjunctivae normal.     Pupils: Pupils are equal, round, and reactive to light.  Neck:     Musculoskeletal: Normal range of motion and neck supple.     Thyroid: No thyromegaly.  Cardiovascular:     Rate and Rhythm: Normal rate and regular rhythm.     Heart sounds: Normal heart sounds. No murmur. No friction rub. No gallop.   Pulmonary:     Effort: Pulmonary effort is normal. No respiratory distress.     Breath sounds: Normal breath sounds. No stridor. No wheezing or rales.  Abdominal:     General: Bowel sounds are normal. There is no distension.     Palpations: Abdomen is soft.     Tenderness: There is abdominal tenderness in the left lower quadrant. There is no right CVA tenderness, left CVA tenderness, guarding or rebound.  Musculoskeletal:     Right lower leg: No edema.     Left lower leg: No edema.  Lymphadenopathy:     Cervical: No cervical adenopathy.  Skin:    General: Skin is warm and dry.     Coloration: Skin is not pale.     Findings: No rash.  Neurological:     Mental Status: She is alert.     Coordination: Coordination normal.      ED Treatments / Results  Labs (all labs ordered are listed, but only abnormal results are displayed) Labs Reviewed  COMPREHENSIVE METABOLIC PANEL - Abnormal; Notable for the following components:  Result Value   Sodium  134 (*)    Potassium 3.1 (*)    CO2 14 (*)    Glucose, Bld 388 (*)    BUN 34 (*)    Creatinine, Ser 2.41 (*)    Albumin 3.4 (*)    GFR calc non Af Amer 20 (*)    GFR calc Af Amer 23 (*)    Anion gap 19 (*)    All other components within normal limits  TROPONIN I - Abnormal; Notable for the following components:   Troponin I 0.03 (*)    All other components within normal limits  POCT I-STAT EG7 - Abnormal; Notable for the following components:   pCO2, Ven 31.3 (*)    pO2, Ven 29.0 (*)    Bicarbonate 15.3 (*)    TCO2 16 (*)    Acid-base deficit 10.0 (*)    Potassium 3.1 (*)    Calcium, Ion 1.04 (*)    All other components within normal limits  CBG MONITORING, ED - Abnormal; Notable for the following components:   Glucose-Capillary 304 (*)    All other components within normal limits  CBG MONITORING, ED - Abnormal; Notable for the following components:   Glucose-Capillary 294 (*)    All other components within normal limits  LIPASE, BLOOD  CBC WITH DIFFERENTIAL/PLATELET  URINALYSIS, ROUTINE W REFLEX MICROSCOPIC  BLOOD GAS, VENOUS    EKG None  Radiology Ct Abdomen Pelvis Wo Contrast  Result Date: 04/15/2019 CLINICAL DATA:  Acute onset of nausea, vomiting and diarrhea that began 5 days ago, associated with LEFT LOWER QUADRANT abdominal pain. Surgical history includes hysterectomy. EXAM: CT ABDOMEN AND PELVIS WITHOUT CONTRAST TECHNIQUE: Multidetector CT imaging of the abdomen and pelvis was performed following the standard protocol without IV contrast. Oral contrast was administered, though the patient vomited the majority of the contrast. COMPARISON:  11/16/2018. FINDINGS: Lower chest: Linear atelectasis involving the RIGHT MIDDLE LOBE, lingula and to a lesser degree the BILATERAL lower lobes. Heart mildly enlarged. Severe three-vessel coronary atherosclerosis. Hepatobiliary: Normal unenhanced appearance of the liver. Borderline gallbladder distention without evidence of  cholelithiasis or cholecystitis. No biliary ductal dilation. Pancreas: Atrophic as noted previously. No mass or peripancreatic inflammation. Spleen: Normal unenhanced appearance. Adrenals/Urinary Tract: Normal appearing adrenal glands. No evidence of urinary tract calculi. Within the limits of the unenhanced technique, no focal parenchymal abnormality involving either kidney. No evidence of hydronephrosis involving either kidney. Normal appearing urinary bladder. Stomach/Bowel: Stomach normal in appearance for the degree of distention. Moderate diffuse small bowel distension without evidence of a transition site. Mild distension of the ascending and transverse colon, with the descending colon, sigmoid colon and rectum decompressed. Liquid stool in the colon. Postsurgical changes adjacent to the cecum, including minimal edema/fluid in the RIGHT paracolic gutter. No evidence of free intraperitoneal air. Vascular/Lymphatic: Mild-to-moderate aortoiliofemoral and visceral artery atherosclerosis without evidence of aneurysm. No pathologic lymphadenopathy. Reproductive: Surgically absent uterus. No adnexal masses. Other: Small amount of free fluid dependently in the pelvis. Small umbilical hernia containing fat. Musculoskeletal: Multilevel degenerative disc disease, spondylosis and facet degenerative changes throughout the lumbar spine. Degenerative disc disease throughout the visualized thoracic spine. Compression fracture of the UPPER endplate of L2, unchanged since the prior CT. Mild compression fracture of the LOWER endplate of T9, new since the prior examination. Mild degenerative changes involving both hips. IMPRESSION: 1. Moderate diffuse small bowel distension without evidence of a transition point, and mild distension of the ascending and transverse colon. Ileus is favored over  bowel obstruction. 2. Small amount of ascites dependently in the pelvis. 3. Linear atelectasis involving the visualized lung bases. 4.  Borderline gallbladder distention without evidence of cholelithiasis or cholecystitis. 5.  Aortic Atherosclerosis (ICD10-170.0) Electronically Signed   By: Evangeline Dakin M.D.   On: 04/15/2019 13:46   Dg Chest 2 View  Result Date: 04/15/2019 CLINICAL DATA:  Pt here from home with c/o n/v since Thursday, pt also c/o sob and a sore throat.Shortness of breath today EXAM: CHEST - 2 VIEW COMPARISON:  07/03/2011 FINDINGS: Normal cardiac silhouette. Small linear opacity adjacent to the LEFT heart border within the lingula. LEFT upper lobe clear. RIGHT lung clear. No pneumothorax. No pleural fluid. IMPRESSION: Atelectasis versus infiltrate in the lingular lobe. Electronically Signed   By: Suzy Bouchard M.D.   On: 04/15/2019 10:27    Procedures .Critical Care Performed by: Frederica Kuster, PA-C Authorized by: Frederica Kuster, PA-C   Critical care provider statement:    Critical care time (minutes):  45   Critical care was necessary to treat or prevent imminent or life-threatening deterioration of the following conditions:  Metabolic crisis, dehydration and renal failure   Critical care was time spent personally by me on the following activities:  Discussions with consultants, evaluation of patient's response to treatment, examination of patient, ordering and performing treatments and interventions, ordering and review of laboratory studies, ordering and review of radiographic studies, pulse oximetry, re-evaluation of patient's condition, obtaining history from patient or surrogate and review of old charts   I assumed direction of critical care for this patient from another provider in my specialty: yes     (including critical care time)  Medications Ordered in ED Medications  insulin regular, human (MYXREDLIN) 100 units/ 100 mL infusion (5.4 Units/hr Intravenous Rate/Dose Change 04/15/19 1520)  potassium chloride 10 mEq in 100 mL IVPB (10 mEq Intravenous New Bag/Given 04/15/19 1533)  dextrose 5  %-0.45 % sodium chloride infusion ( Intravenous Hold 04/15/19 1533)  sodium chloride 0.9 % bolus 1,000 mL (0 mLs Intravenous Stopped 04/15/19 1519)    And  0.9 %  sodium chloride infusion ( Intravenous New Bag/Given 04/15/19 1531)  sodium chloride 0.9 % bolus 1,000 mL (0 mLs Intravenous Stopped 04/15/19 1519)  sodium chloride 0.9 % bolus 1,000 mL (1,000 mLs Intravenous New Bag/Given 04/15/19 1531)     Initial Impression / Assessment and Plan / ED Course  I have reviewed the triage vital signs and the nursing notes.  Pertinent labs & imaging results that were available during my care of the patient were reviewed by me and considered in my medical decision making (see chart for details).        Patient presenting with DKA.  Bicarb is 14, anion gap 19.  Patient with AKI with BUN 34, creatinine 2.41.  She has had a few days of nausea, vomiting, diarrhea.  Her CT scan is also concerning for ileus.  Fluids and glucose stabilizer initiated in the ED.  Insulin pump suspended.  Patient is afebrile.  She had some shortness of breath started today, but no chest pain.  Troponin is mildly elevated at 0.03.  Low suspicion for ACS at this time and most likely related to demand ischemia and AKI.  I discussed patient case with the family medicine teaching service who accepts patient for admission.  Appreciate their assistance with the patient.  Patient also evaluated by my attending, Dr. Ralene Bathe, who guided the patient's management and agrees with plan.  Final Clinical Impressions(s) /  ED Diagnoses   Final diagnoses:  Diabetic ketoacidosis without coma associated with type 1 diabetes mellitus [facility])  Ileus [facility])    ED Discharge Orders    None       Frederica Kuster, PA-C 04/15/19 1534    Quintella Reichert, MD 04/18/19 1013

## 2019-04-16 DIAGNOSIS — K567 Ileus, unspecified: Secondary | ICD-10-CM

## 2019-04-16 DIAGNOSIS — E1010 Type 1 diabetes mellitus with ketoacidosis without coma: Secondary | ICD-10-CM

## 2019-04-16 LAB — BASIC METABOLIC PANEL
Anion gap: 11 (ref 5–15)
Anion gap: 13 (ref 5–15)
Anion gap: 8 (ref 5–15)
Anion gap: 12 (ref 5–15)
Anion gap: 11 (ref 5–15)
BUN: 34 mg/dL — ABNORMAL HIGH (ref 8–23)
BUN: 33 mg/dL — ABNORMAL HIGH (ref 8–23)
BUN: 34 mg/dL — ABNORMAL HIGH (ref 8–23)
BUN: 34 mg/dL — ABNORMAL HIGH (ref 8–23)
BUN: 31 mg/dL — ABNORMAL HIGH (ref 8–23)
CO2: 14 mmol/L — ABNORMAL LOW (ref 22–32)
CO2: 13 mmol/L — ABNORMAL LOW (ref 22–32)
CO2: 13 mmol/L — ABNORMAL LOW (ref 22–32)
CO2: 11 mmol/L — ABNORMAL LOW (ref 22–32)
CO2: 11 mmol/L — ABNORMAL LOW (ref 22–32)
Calcium: 8 mg/dL — ABNORMAL LOW (ref 8.9–10.3)
Calcium: 8.1 mg/dL — ABNORMAL LOW (ref 8.9–10.3)
Calcium: 7.8 mg/dL — ABNORMAL LOW (ref 8.9–10.3)
Calcium: 7.9 mg/dL — ABNORMAL LOW (ref 8.9–10.3)
Calcium: 7.9 mg/dL — ABNORMAL LOW (ref 8.9–10.3)
Chloride: 109 mmol/L (ref 98–111)
Chloride: 110 mmol/L (ref 98–111)
Chloride: 114 mmol/L — ABNORMAL HIGH (ref 98–111)
Chloride: 114 mmol/L — ABNORMAL HIGH (ref 98–111)
Chloride: 115 mmol/L — ABNORMAL HIGH (ref 98–111)
Creatinine, Ser: 1.86 mg/dL — ABNORMAL HIGH (ref 0.44–1.00)
Creatinine, Ser: 1.8 mg/dL — ABNORMAL HIGH (ref 0.44–1.00)
Creatinine, Ser: 1.76 mg/dL — ABNORMAL HIGH (ref 0.44–1.00)
Creatinine, Ser: 1.64 mg/dL — ABNORMAL HIGH (ref 0.44–1.00)
Creatinine, Ser: 1.64 mg/dL — ABNORMAL HIGH (ref 0.44–1.00)
GFR calc Af Amer: 32 mL/min — ABNORMAL LOW (ref >60)
GFR calc Af Amer: 33 mL/min — ABNORMAL LOW (ref >60)
GFR calc Af Amer: 34 mL/min — ABNORMAL LOW (ref >60)
GFR calc Af Amer: 37 mL/min — ABNORMAL LOW (ref >60)
GFR calc Af Amer: 37 mL/min — ABNORMAL LOW (ref >60)
GFR calc non Af Amer: 27 mL/min — ABNORMAL LOW (ref >60)
GFR calc non Af Amer: 28 mL/min — ABNORMAL LOW (ref >60)
GFR calc non Af Amer: 29 mL/min — ABNORMAL LOW (ref >60)
GFR calc non Af Amer: 32 mL/min — ABNORMAL LOW (ref >60)
GFR calc non Af Amer: 32 mL/min — ABNORMAL LOW (ref >60)
Glucose, Bld: 195 mg/dL — ABNORMAL HIGH (ref 70–99)
Glucose, Bld: 203 mg/dL — ABNORMAL HIGH (ref 70–99)
Glucose, Bld: 246 mg/dL — ABNORMAL HIGH (ref 70–99)
Glucose, Bld: 126 mg/dL — ABNORMAL HIGH (ref 70–99)
Glucose, Bld: 259 mg/dL — ABNORMAL HIGH (ref 70–99)
Potassium: 3 mmol/L — ABNORMAL LOW (ref 3.5–5.1)
Potassium: 3.7 mmol/L (ref 3.5–5.1)
Potassium: 3.3 mmol/L — ABNORMAL LOW (ref 3.5–5.1)
Potassium: 3.7 mmol/L (ref 3.5–5.1)
Potassium: 4.4 mmol/L (ref 3.5–5.1)
Sodium: 134 mmol/L — ABNORMAL LOW (ref 135–145)
Sodium: 136 mmol/L (ref 135–145)
Sodium: 135 mmol/L (ref 135–145)
Sodium: 137 mmol/L (ref 135–145)
Sodium: 137 mmol/L (ref 135–145)

## 2019-04-16 LAB — SARS CORONAVIRUS 2 BY RT PCR (HOSPITAL ORDER, PERFORMED IN ~~LOC~~ HOSPITAL LAB): SARS Coronavirus 2: NEGATIVE

## 2019-04-16 LAB — GLUCOSE, CAPILLARY
Glucose-Capillary: 177 mg/dL — ABNORMAL HIGH (ref 70–99)
Glucose-Capillary: 199 mg/dL — ABNORMAL HIGH (ref 70–99)
Glucose-Capillary: 178 mg/dL — ABNORMAL HIGH (ref 70–99)
Glucose-Capillary: 138 mg/dL — ABNORMAL HIGH (ref 70–99)
Glucose-Capillary: 130 mg/dL — ABNORMAL HIGH (ref 70–99)
Glucose-Capillary: 157 mg/dL — ABNORMAL HIGH (ref 70–99)
Glucose-Capillary: 198 mg/dL — ABNORMAL HIGH (ref 70–99)
Glucose-Capillary: 229 mg/dL — ABNORMAL HIGH (ref 70–99)
Glucose-Capillary: 243 mg/dL — ABNORMAL HIGH (ref 70–99)
Glucose-Capillary: 233 mg/dL — ABNORMAL HIGH (ref 70–99)
Glucose-Capillary: 194 mg/dL — ABNORMAL HIGH (ref 70–99)
Glucose-Capillary: 132 mg/dL — ABNORMAL HIGH (ref 70–99)
Glucose-Capillary: 79 mg/dL (ref 70–99)
Glucose-Capillary: 66 mg/dL — ABNORMAL LOW (ref 70–99)
Glucose-Capillary: 81 mg/dL (ref 70–99)
Glucose-Capillary: 119 mg/dL — ABNORMAL HIGH (ref 70–99)
Glucose-Capillary: 264 mg/dL — ABNORMAL HIGH (ref 70–99)

## 2019-04-16 LAB — CBC
HCT: 35.7 % — ABNORMAL LOW (ref 36.0–46.0)
Hemoglobin: 11.5 g/dL — ABNORMAL LOW (ref 12.0–15.0)
MCH: 27.8 pg (ref 26.0–34.0)
MCHC: 32.2 g/dL (ref 30.0–36.0)
MCV: 86.2 fL (ref 80.0–100.0)
Platelets: 220 10*3/uL (ref 150–400)
RBC: 4.14 MIL/uL (ref 3.87–5.11)
RDW: 14 % (ref 11.5–15.5)
WBC: 8.2 10*3/uL (ref 4.0–10.5)
nRBC: 0 % (ref 0.0–0.2)

## 2019-04-16 MED ORDER — APIXABAN 5 MG PO TABS
5.0000 mg | ORAL_TABLET | Freq: Two times a day (BID) | ORAL | Status: DC
  Administered 2019-04-16 – 2019-04-18 (×5): 5 mg via ORAL
  Filled 2019-04-16 (×5): qty 1

## 2019-04-16 MED ORDER — PANTOPRAZOLE SODIUM 40 MG PO TBEC
40.0000 mg | DELAYED_RELEASE_TABLET | Freq: Every day | ORAL | Status: DC
  Administered 2019-04-16 – 2019-04-18 (×3): 40 mg via ORAL
  Filled 2019-04-16 (×4): qty 1

## 2019-04-16 MED ORDER — SODIUM CHLORIDE 0.9 % IV BOLUS
1000.0000 mL | Freq: Once | INTRAVENOUS | Status: AC
  Administered 2019-04-16: 1000 mL via INTRAVENOUS

## 2019-04-16 MED ORDER — SODIUM CHLORIDE 0.9 % IV BOLUS
500.0000 mL | Freq: Once | INTRAVENOUS | Status: AC
  Administered 2019-04-16: 03:00:00 500 mL via INTRAVENOUS

## 2019-04-16 MED ORDER — PHENOL 1.4 % MT LIQD
1.0000 | OROMUCOSAL | Status: DC | PRN
  Filled 2019-04-16: qty 177

## 2019-04-16 MED ORDER — INSULIN ASPART 100 UNIT/ML ~~LOC~~ SOLN
0.0000 [IU] | Freq: Three times a day (TID) | SUBCUTANEOUS | Status: DC
  Administered 2019-04-17: 3 [IU] via SUBCUTANEOUS

## 2019-04-16 MED ORDER — INSULIN DETEMIR 100 UNIT/ML ~~LOC~~ SOLN
10.0000 [IU] | Freq: Two times a day (BID) | SUBCUTANEOUS | Status: DC
  Administered 2019-04-16 – 2019-04-17 (×2): 10 [IU] via SUBCUTANEOUS
  Filled 2019-04-16 (×5): qty 0.1

## 2019-04-16 MED ORDER — POTASSIUM CHLORIDE CRYS ER 20 MEQ PO TBCR
40.0000 meq | EXTENDED_RELEASE_TABLET | Freq: Once | ORAL | Status: AC
  Administered 2019-04-16: 14:00:00 40 meq via ORAL
  Filled 2019-04-16: qty 2

## 2019-04-16 MED ORDER — POTASSIUM CHLORIDE CRYS ER 20 MEQ PO TBCR
40.0000 meq | EXTENDED_RELEASE_TABLET | Freq: Once | ORAL | Status: AC
  Administered 2019-04-16: 04:00:00 40 meq via ORAL
  Filled 2019-04-16: qty 2

## 2019-04-16 MED ORDER — LEVOTHYROXINE SODIUM 75 MCG PO TABS
175.0000 ug | ORAL_TABLET | Freq: Every day | ORAL | Status: DC
  Administered 2019-04-17 – 2019-04-18 (×2): 175 ug via ORAL
  Filled 2019-04-16 (×2): qty 1

## 2019-04-16 MED ORDER — FLUOXETINE HCL 10 MG PO CAPS
10.0000 mg | ORAL_CAPSULE | Freq: Every day | ORAL | Status: DC
  Administered 2019-04-17 – 2019-04-18 (×2): 10 mg via ORAL
  Filled 2019-04-16 (×3): qty 1

## 2019-04-16 MED ORDER — INSULIN DETEMIR 100 UNIT/ML ~~LOC~~ SOLN: 10.0000 [IU] | Freq: Two times a day (BID) | SUBCUTANEOUS | Status: DC

## 2019-04-16 MED ORDER — ATORVASTATIN CALCIUM 80 MG PO TABS
80.0000 mg | ORAL_TABLET | Freq: Every day | ORAL | Status: DC
  Administered 2019-04-16 – 2019-04-17 (×2): 80 mg via ORAL
  Filled 2019-04-16 (×2): qty 1

## 2019-04-16 MED ORDER — INSULIN DETEMIR 100 UNIT/ML ~~LOC~~ SOLN
22.0000 [IU] | Freq: Every day | SUBCUTANEOUS | Status: DC
  Filled 2019-04-16 (×5): qty 0.22

## 2019-04-16 MED ORDER — METOPROLOL SUCCINATE ER 100 MG PO TB24
100.0000 mg | ORAL_TABLET | Freq: Every day | ORAL | Status: DC
  Administered 2019-04-17 – 2019-04-18 (×2): 100 mg via ORAL
  Filled 2019-04-16 (×2): qty 1

## 2019-04-16 NOTE — TOC Initial Note (Addendum)
Transition of Care [facility]) - Initial/Assessment Note    Patient Details  Name: [name] MRN: [id number] Date of Birth: Sep 24, 1951  [facility]) CM/SW Contact:    Sharin Mons, RN Phone Number: 04/16/2019, 2:05 PM  Clinical Narrative:    Admitted with persistent nausea, vomiting, diarrhea. From home with family. PTA independent with ADL's. DME: walker, 3in1/BSC.  Adrian Blackwater (Sister) Mancel Bale (Niece)     [phone]     PCP: Shirley [name]  NCM following for [facility] needs ......  Expected Discharge Plan: Home/Self Care Barriers to Discharge: Continued Medical Work up   Patient Goals and CMS Choice Patient states their goals for this hospitalization and ongoing recovery are:: to get better and care for myself CMS Medicare.gov Compare Post Acute Care list provided to:: Patient Choice offered to / list presented to : Patient  Expected Discharge Plan and Services Expected Discharge Plan: Home/Self Care   Discharge Planning Services: NA Post Acute Care Choice: NA Living arrangements for the past 2 months: Single Family Home                 DME Arranged: N/A DME Agency: NA      Prior Living Arrangements/Services Living arrangements for the past 2 months: Single Family Home Lives with:: Siblings Patient language and need for interpreter reviewed:: Yes Do you feel safe going back to the place where you live?: Yes      Need for Family Participation in Patient Care: No (Comment) Care giver support system in place?: Yes (comment)   Criminal Activity/Legal Involvement Pertinent to Current Situation/Hospitalization: No - Comment as needed  Activities of Daily Living      Permission Sought/Granted Permission sought to share information with : Case Manager Permission granted to share information with : Yes, Verbal Permission Granted  Share Information with NAME: Adrian Blackwater (Sister)           Emotional  Assessment Appearance:: Appears stated age Attitude/Demeanor/Rapport: Gracious Affect (typically observed): Accepting Orientation: : Oriented to Self, Oriented to  Time, Oriented to Place, Oriented to Situation Alcohol / Substance Use: Not Applicable Psych Involvement: No (comment)  Admission diagnosis:  Ileus ([place]) [K56.7] Diabetic ketoacidosis without coma associated with type 1 diabetes mellitus ([place]) [E10.10] Nausea and vomiting [R11.2] Patient Active Problem List   Diagnosis Date Noted  . Viral gastroenteritis 04/15/2019  . Nausea and vomiting 04/15/2019  . (HFpEF) heart failure with preserved ejection fraction ([place]) 03/17/2019  . Hot flashes 12/11/2018  . 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 Abnormal echocardiogram   . Stroke (cerebrum) ([place]) 10/13/2018  . Frequent PVCs   . Uncontrolled type 1 diabetes mellitus ([place])   . BPPV (benign paroxysmal positional vertigo), right 06/13/2017  . Premature atrial beats 06/13/2017  . Snoring 07/31/2016  . Pre-ulcerative corn or callous 12/15/2015  . History of CHF (congestive heart failure) 10/29/2015  . Essential hypertension 10/29/2015  . Cerebrovascular accident (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 ([place]) 10/29/2015  . Intracranial vascular stenosis   . HLD (hyperlipidemia)   . Cerebral infarction due to thrombosis of basilar artery ([place])   . CVA (cerebral vascular accident) ([place]) 08/29/2015  . Morbid obesity ([place]) 05/29/2013  . Back pain without radiation 04/28/2013  . Nail dystrophy 01/10/2013  . Diabetes mellitus ([place]) 01/25/2012  . Hyperlipidemia 01/25/2012  . Hypothyroidism 01/25/2012  . Hypertension 01/25/2012  . GERD (gastroesophageal reflux disease) 01/25/2012   PCP:  Shirley, [name], DO Pharmacy:   [id number] [place], [place] [address] [place] [address] Phone: [phone] Fax: [phone]     Social  Determinants of Health (SDOH) Interventions    Readmission Risk Interventions No flowsheet data found.

## 2019-04-16 NOTE — Progress Notes (Signed)
Family Medicine Teaching Service Daily Progress Note Intern Pager: [phone]  Patient name: [name] Medical record number: [id number] Date of birth: [birth year] Age: 68 y.o. Gender: female  Primary Care Provider: Shirley, [name], DO Consultants: none Code Status: full  Pt Overview and Major Events to Date:  4/21 admitted with DKA and ileus  Assessment and Plan: Lillyth Spong Riddhi Grether is a 68 y.o. female who presented with nausea, vomiting, diarrhea and was found to be in DKA. PMH is significant for atrial fibrillation, T1DM on insulin pump, multiple strokes, hypothyroidism 2/2 thyroidectomy for thyroid cancer, HTN, HLD, GERD, depression/anxiety, chronic back pain.   DKA in uncontrolled T1DM, resolved. In the setting of ileus vs SBO. Improved with anion gap closed x2. Abdominal pain with hypoactive bowel sounds continues to persist and patient has had not had BM or passed gas. Most likely ileus as no transition point seen on imaging but SBO is still possible. - transition off insulin gtt today to subcutaneous insulin: levemir 22U QD and sSSI - monitor CBGs and am BMP - liquid diet - monitor BMs - Tylenol PRN - Phenergan prn  Sore throat and fatigue. No fever or SOB. No known sick contacts. Low likelihood of COVID however new onset sore throat today. - Rapid in house COVID test ordered, if negative would trust that test result - droplet and contact precautions - chloraseptic spray prn  AKI 2/2 Dehydration, improving: Cr 2.41 on admit (baseline 1.0) improved to 1.76 today - monitor on BMP  - avoid nephrotoxic medications  Hypokalemia, resolved. K 3.3 today - replete with 40 mEq - monitor with AM BMP  Atrial Fibrillation  H/o multiple CVAs. Stable. Rate controlled - restart home eliquis and metoprolol  Hypothyroidism: - restart home Synthroid 124mcg QD.  HFpEF and HTN: Soft BPs today 90-100s/50s. - restarting home metoprolol as per above - holding  home losartan and torsemide for AKI   HLD:  - restart home atorvastatin  Chronic Back Pain: Home meds include: Voltaren 1% gel, Gabapentin 300mg  qHS. - Hold home meds while NPO  GERD: - continue protonix  Depression/Anxiety: - restart home prozac   FEN/GI: full liquid diet, protonix PPx: eliquis  Disposition: pending medical management. Transfer to 2W for COVID testing.  Subjective:  States has sore throat today that is new. She feels very tired. States that she has not passed gas this morning.  Objective: Temp:  [98 F (36.7 C)-98.5 F (36.9 C)] 98.5 F (36.9 C) (04/22 0818) Pulse Rate:  [37-109] 74 (04/22 0554) Resp:  [18-24] 23 (04/22 0554) BP: (90-139)/(40-75) 100/57 (04/22 0554) SpO2:  [91 %-100 %] 98 % (04/22 0339) Weight:  [113.9 kg] 113.9 kg (04/21 1700) Physical Exam: General: laying in bed, appears tired but in NAD Cardiovascular: irregularly irregular, no murmurs Respiratory: CTAB with NWOB on room Abdomen: TTP over LLQ, soft, no rebound, hypoactive bowel sounds Extremities: WWP, no LE edema  Laboratory: Recent Labs  Lab 04/15/19 0958 04/15/19 1333 04/16/19 0612  WBC 7.0  --  8.2  HGB 13.1 15.0 11.5*  HCT 42.2 44.0 35.7*  PLT 244  --  220   Recent Labs  Lab 04/15/19 0958  04/16/19 0226 04/16/19 0612 04/16/19 1004  NA 134*   < > 134* 136 135  K 3.1*   < > 3.0* 3.7 3.3*  CL 101   < > 109 110 114*  CO2 14*   < > 14* 13* 13*  BUN 34*   < > 34* 33* 34*  CREATININE 2.41*   < > 1.86* 1.80* 1.76*  CALCIUM 9.2   < > 8.0* 8.1* 7.8*  PROT 7.6  --   --   --   --   BILITOT 0.7  --   --   --   --   ALKPHOS 94  --   --   --   --   ALT 19  --   --   --   --   AST 16  --   --   --   --   GLUCOSE 388*   < > 195* 203* 246*   < > = values in this interval not displayed.    Hemoglobin a1c 9.3  Imaging/Diagnostic Tests: Ct Abdomen Pelvis Wo Contrast  Result Date: 04/15/2019 CLINICAL DATA:  Acute onset of nausea, vomiting and diarrhea that began  5 days ago, associated with LEFT LOWER QUADRANT abdominal pain. Surgical history includes hysterectomy. EXAM: CT ABDOMEN AND PELVIS WITHOUT CONTRAST TECHNIQUE: Multidetector CT imaging of the abdomen and pelvis was performed following the standard protocol without IV contrast. Oral contrast was administered, though the patient vomited the majority of the contrast. COMPARISON:  11/16/2018. FINDINGS: Lower chest: Linear atelectasis involving the RIGHT MIDDLE LOBE, lingula and to a lesser degree the BILATERAL lower lobes. Heart mildly enlarged. Severe three-vessel coronary atherosclerosis. Hepatobiliary: Normal unenhanced appearance of the liver. Borderline gallbladder distention without evidence of cholelithiasis or cholecystitis. No biliary ductal dilation. Pancreas: Atrophic as noted previously. No mass or peripancreatic inflammation. Spleen: Normal unenhanced appearance. Adrenals/Urinary Tract: Normal appearing adrenal glands. No evidence of urinary tract calculi. Within the limits of the unenhanced technique, no focal parenchymal abnormality involving either kidney. No evidence of hydronephrosis involving either kidney. Normal appearing urinary bladder. Stomach/Bowel: Stomach normal in appearance for the degree of distention. Moderate diffuse small bowel distension without evidence of a transition site. Mild distension of the ascending and transverse colon, with the descending colon, sigmoid colon and rectum decompressed. Liquid stool in the colon. Postsurgical changes adjacent to the cecum, including minimal edema/fluid in the RIGHT paracolic gutter. No evidence of free intraperitoneal air. Vascular/Lymphatic: Mild-to-moderate aortoiliofemoral and visceral artery atherosclerosis without evidence of aneurysm. No pathologic lymphadenopathy. Reproductive: Surgically absent uterus. No adnexal masses. Other: Small amount of free fluid dependently in the pelvis. Small umbilical hernia containing fat. Musculoskeletal:  Multilevel degenerative disc disease, spondylosis and facet degenerative changes throughout the lumbar spine. Degenerative disc disease throughout the visualized thoracic spine. Compression fracture of the UPPER endplate of L2, unchanged since the prior CT. Mild compression fracture of the LOWER endplate of T9, new since the prior examination. Mild degenerative changes involving both hips. IMPRESSION: 1. Moderate diffuse small bowel distension without evidence of a transition point, and mild distension of the ascending and transverse colon. Ileus is favored over bowel obstruction. 2. Small amount of ascites dependently in the pelvis. 3. Linear atelectasis involving the visualized lung bases. 4. Borderline gallbladder distention without evidence of cholelithiasis or cholecystitis. 5.  Aortic Atherosclerosis (ICD10-170.0) Electronically Signed   By: Evangeline Dakin M.D.   On: 04/15/2019 13:46    Bufford Lope, DO 04/16/2019, 12:26 PM PGY-3, [place] Intern pager: [phone], text pages welcome

## 2019-04-16 NOTE — Progress Notes (Signed)
Pt manual BP 90/40. Pt asymptomatic. Night time coverage, Winfrey, paged. Will continue to monitor and treat per MD orders.

## 2019-04-16 NOTE — Progress Notes (Signed)
FMTS was paged and a verbal order was given to d/c the dextrose IV to be stopped.

## 2019-04-16 NOTE — Progress Notes (Signed)
Notified by CCMD that pt has a 4-sec episode of bigeminy PVCs. Night coverage, Winfrey, notified. Will continue to monitor and treat per MD orders.

## 2019-04-16 NOTE — Progress Notes (Signed)
Inpatient Diabetes Program Recommendations  AACE/ADA: New Consensus Statement on Inpatient Glycemic Control (2015)  Target Ranges:  Prepandial:   less than 140 mg/dL      Peak postprandial:   less than 180 mg/dL (1-2 hours)      Critically ill patients:  140 - 180 mg/dL   Lab Results  Component Value Date   GLUCAP 243 (H) 04/16/2019   HGBA1C 9.3 (H) 04/15/2019    Review of Glycemic Control Results for [name], [name] (MRN [id number]) as of 04/16/2019 09:40  Ref. Range 04/16/2019 05:45 04/16/2019 06:41 04/16/2019 07:43 04/16/2019 08:55  Glucose-Capillary Latest Ref Range: 70 - 99 mg/dL 157 (H) 198 (H) 229 (H) 243 (H)   Diabetes history: Type 1 DM (requires basal and carb coverage) Outpatient Diabetes medications: Humalog per insulin pump: Current orders for Inpatient glycemic control: IV insulin  Inpatient Diabetes Program Recommendations:    When ready to transition patient, per MD, (CO2 >20, Anion Gap <8, drip rates <3.0 units/hr) consider:  -Changing to DKA order set for proper transition. Specifically, order for basal to be given 2 hours prior to discontinuation of drip.  - Levemir 22 units QD (to be given 2 hours prior to discontinuation of drip) - Novolog 0-9 units Q4H, until diet order changes.     Noted patient is followed by Dr Chalmers Cater, endocrinology. These are the pump settings from previous admission 12/13/2018.  Basal Rates: 4am- 0.8 units/hr 9am- 1.3 units/hr 12pm- 0.95 units/hr 1:30pm- 1.15 units/hr 3pm- 1 units/hr 6pm- 1.1 units/hr 7:30pm- 0.95 units/hr 12am- 0.55 units/hr  Total Basal Insulin per 24 hours period= 22.175 units  Carbohydrate Ratio: 1 unit for every 6 Grams of Carbohydrates  Correction/Sensitivity Factor:  12am= 1 unit for every 60 mg/dl above Target CBG 5am= 1 unit for every 60 mg/dl above Target CBG 8am= 1 unit for every 50 mg/dl above Target CBG 8pm= 1 unit for every 60 mg/dl above Target CBG  Target CBG: 140 mg/dl  Spoke  with patient and verified insulin pump settings. At this time patient has insulin pump and supplies at hospital, however, is unable to locate them and does not feel like she can be independent at this time with operating.  Reviewed patient's current A1c of 9.3%. Explained what a A1c is and what it measures. Also reviewed goal A1c with patient, importance of good glucose control @ home, and blood sugar goals. Reviewed patho of DM, DKA, insulin pump settings, current inpatient needs while on IV insulin and reminded patient that she needs 2 hours of basal prior to IV insulin being stopped.  Patient has a follow up appointment with Dr Chalmers Cater in May and has been working to get her A1C down. She explains, "I have started being more accountable and eating three meals a day and working with PT to help build my strength." She has supplies and meter.  In agreement to subQ insulin at time of transition, with plan to possibly apply pump tomorrow if appropriate clinically. Patient has no further questions at this time.   Thanks, Bronson Curb, MSN, RNC-OB Diabetes Coordinator [phone] (8a-5p)

## 2019-04-17 DIAGNOSIS — Z9641 Presence of insulin pump (external) (internal): Secondary | ICD-10-CM

## 2019-04-17 LAB — CBC
HCT: 35.1 % — ABNORMAL LOW (ref 36.0–46.0)
Hemoglobin: 11.2 g/dL — ABNORMAL LOW (ref 12.0–15.0)
MCH: 28.1 pg (ref 26.0–34.0)
MCHC: 31.9 g/dL (ref 30.0–36.0)
MCV: 88.2 fL (ref 80.0–100.0)
Platelets: 229 10*3/uL (ref 150–400)
RBC: 3.98 MIL/uL (ref 3.87–5.11)
RDW: 14.4 % (ref 11.5–15.5)
WBC: 8 10*3/uL (ref 4.0–10.5)
nRBC: 0 % (ref 0.0–0.2)

## 2019-04-17 LAB — GLUCOSE, CAPILLARY
Glucose-Capillary: 237 mg/dL — ABNORMAL HIGH (ref 70–99)
Glucose-Capillary: 313 mg/dL — ABNORMAL HIGH (ref 70–99)
Glucose-Capillary: 311 mg/dL — ABNORMAL HIGH (ref 70–99)
Glucose-Capillary: 318 mg/dL — ABNORMAL HIGH (ref 70–99)
Glucose-Capillary: 271 mg/dL — ABNORMAL HIGH (ref 70–99)
Glucose-Capillary: 231 mg/dL — ABNORMAL HIGH (ref 70–99)

## 2019-04-17 LAB — BASIC METABOLIC PANEL
Anion gap: 10 (ref 5–15)
BUN: 9 mg/dL (ref 8–23)
CO2: 27 mmol/L (ref 22–32)
Calcium: 8.8 mg/dL — ABNORMAL LOW (ref 8.9–10.3)
Chloride: 95 mmol/L — ABNORMAL LOW (ref 98–111)
Creatinine, Ser: 0.87 mg/dL (ref 0.44–1.00)
GFR calc Af Amer: >60 mL/min (ref >60)
GFR calc non Af Amer: >60 mL/min (ref >60)
Glucose, Bld: 98 mg/dL (ref 70–99)
Potassium: 4.2 mmol/L (ref 3.5–5.1)
Sodium: 132 mmol/L — ABNORMAL LOW (ref 135–145)

## 2019-04-17 MED ORDER — INSULIN ASPART 100 UNIT/ML ~~LOC~~ SOLN: 0.0000 [IU] | Freq: Three times a day (TID) | SUBCUTANEOUS | Status: DC

## 2019-04-17 MED ORDER — INSULIN ASPART 100 UNIT/ML ~~LOC~~ SOLN
1.0000 [IU] | Freq: Once | SUBCUTANEOUS | Status: AC
  Administered 2019-04-17: 100 [IU] via SUBCUTANEOUS
  Filled 2019-04-17: qty 1

## 2019-04-17 MED ORDER — LOPERAMIDE HCL 2 MG PO CAPS
2.0000 mg | ORAL_CAPSULE | ORAL | Status: DC | PRN
  Administered 2019-04-17: 2 mg via ORAL
  Filled 2019-04-17: qty 1

## 2019-04-17 MED ORDER — INSULIN PUMP
Freq: Three times a day (TID) | SUBCUTANEOUS | Status: DC
  Administered 2019-04-17 – 2019-04-18 (×5): via SUBCUTANEOUS

## 2019-04-17 MED ORDER — INSULIN ASPART 100 UNIT/ML ~~LOC~~ SOLN
0.0000 [IU] | Freq: Every day | SUBCUTANEOUS | Status: DC
  Administered 2019-04-17: 3 [IU] via SUBCUTANEOUS

## 2019-04-17 NOTE — Progress Notes (Signed)
Inpatient Diabetes Program Recommendations  AACE/ADA: New Consensus Statement on Inpatient Glycemic Control (2015)  Target Ranges:  Prepandial:   less than 140 mg/dL      Peak postprandial:   less than 180 mg/dL (1-2 hours)      Critically ill patients:  140 - 180 mg/dL   Lab Results  Component Value Date   YHOOIL 579 (H) 04/17/2019   HGBA1C 9.3 (H) 04/15/2019    Review of Glycemic Control  Diabetes history: Type 1 DM (requires basal and carb coverage) Outpatient Diabetes medications: Humalog per insulin pump: Current orders for Inpatient glycemic control:   Inpatient Diabetes Program Recommendations:   Spoke with RN Lilia Pro. Patient to restart back on insulin pump. Reviewed insulin pump orders with contract to be signed by patient and placed into shadow chart and flowsheet to be kept @ bedside for patient to write down insulin doses for correction and meal coverage. RN to call pharmacy for insulin to fill insulin pump cartridge.  Spoke with patient by phone. DM coordinator working remotely. Patient states she has supplies and agrees with restarting insulin pump. Reviewed with patient and RN that since Levemir was given this am, patient needs to wait until right before dinner before restarting insulin pump since patient unsure how to set pump in suspend mode. Will follow and assist as needed.  Thank you, Nani Gasser. Quinlan Mcfall, RN, MSN, CDE  Diabetes Coordinator Inpatient Glycemic Control Team Team Pager [phone] (8am-5pm) 04/17/2019 1:50 PM

## 2019-04-17 NOTE — Evaluation (Signed)
Physical Therapy Evaluation Patient Details Name: [name] MRN: [id number] DOB: 07-12-1951 Today's Date: 04/17/2019   History of Present Illness  Pt is a 68 y/o female who presents with N/V/D. She was found to be in DKA and tested COVID-19 negative. PMH significant for Type 1 DM, thyroid CA, CVA, hypothyroidism, HTN, CHF.  Clinical Impression  Pt admitted with above diagnosis. Pt currently with functional limitations due to the deficits listed below (see PT Problem List). At the time of PT eval pt was able to perform transfers and ambulation with increased time, increased effort, and up to min assist with RW. Pt with 4/4 on dyspnea scale, however O2 sats remained >96% throughout functional mobility on RA. Recommend 24 hour assist at least the first few days home. Pt confident that she will do better at home, however tolerance for functional activity is low at this time and feel HHPT follow up will be beneficial. Acutely, pt will benefit from skilled PT to increase their independence and safety with mobility to allow discharge to the venue listed below.       Follow Up Recommendations Home health PT;Supervision/Assistance - 24 hour    Equipment Recommendations  None recommended by PT    Recommendations for Other Services       Precautions / Restrictions Precautions Precautions: Fall Restrictions Weight Bearing Restrictions: No      Mobility  Bed Mobility Overal bed mobility: Needs Assistance Bed Mobility: Supine to Sit     Supine to sit: Supervision     General bed mobility comments: Increased time and use of rails. Transition to EOB appeared very effortful and supervision provided for safety.   Transfers Overall transfer level: Needs assistance Equipment used: Rolling walker (2 wheeled) Transfers: Sit to/from [facility] Sit to Stand: Min guard;Min assist Stand pivot transfers: Min guard       General transfer comment: Initially requiring  min assist but progressed to min guard assist by end of session.   Ambulation/Gait Ambulation/Gait assistance: Min guard;Min assist Gait Distance (Feet): 100 Feet Assistive device: Rolling walker (2 wheeled) Gait Pattern/deviations: Decreased stride length;Wide base of support;Step-through pattern Gait velocity: Decreased Gait velocity interpretation: <1.8 ft/sec, indicate of risk for recurrent falls General Gait Details: Fstiguers quickly. Pt was able to ambulate fairly well once she got going however required min assist initially for walker management. Pt reports she is used to a rollator and the RW is hard to steer.   Stairs            Wheelchair Mobility    Modified Rankin (Stroke Patients Only)       Balance Overall balance assessment: Needs assistance Sitting-balance support: Feet supported;No upper extremity supported Sitting balance-Leahy Scale: Fair     Standing balance support: Single extremity supported;During functional activity Standing balance-Leahy Scale: Fair Standing balance comment: Pt was able to perform standing peri-care after BSC use with 1 UE on RW.                              Pertinent Vitals/Pain Pain Assessment: No/denies pain    Home Living Family/patient expects to be discharged to:: Private residence Living Arrangements: Other relatives(Sister and granddaughter) Available Help at Discharge: Family;Available 24 hours/day Type of Home: House Home Access: Ramped entrance     Home Layout: One level Home Equipment: Walker - 4 wheels;Shower seat;Bedside commode;Grab bars - tub/shower;Hand held shower head      Prior Function  Level of Independence: Needs assistance   Gait / Transfers Assistance Needed: Rollator at home. Going to [address] [facility]hab.   ADL's / Homemaking Assistance Needed: sister cooks, cleans and different family members drive        Hand Dominance   Dominant Hand: Right    Extremity/Trunk  Assessment   Upper Extremity Assessment Upper Extremity Assessment: Defer to OT evaluation    Lower Extremity Assessment Lower Extremity Assessment: Generalized weakness    Cervical / Trunk Assessment Cervical / Trunk Assessment: Other exceptions Cervical / Trunk Exceptions: Flexed posture with forward head and rounded shoulders  Communication   Communication: No difficulties  Cognition Arousal/Alertness: Awake/alert Behavior During Therapy: WFL for tasks assessed/performed Overall Cognitive Status: Within Functional Limits for tasks assessed                                        General Comments      Exercises     Assessment/Plan    PT Assessment Patient needs continued PT services  PT Problem List Decreased strength;Decreased activity tolerance;Decreased balance;Decreased mobility;Decreased knowledge of use of DME;Decreased safety awareness;Decreased knowledge of precautions;Cardiopulmonary status limiting activity       PT Treatment Interventions DME instruction;Gait training;Functional mobility training;Therapeutic activities;Therapeutic exercise;Neuromuscular re-education;Patient/family education    PT Goals (Current goals can be found in the Care Plan section)  Acute Rehab PT Goals Patient Stated Goal: Be able to go back to [address] [facility]  PT Goal Formulation: With patient Time For Goal Achievement: 04/24/19 Potential to Achieve Goals: Good    Frequency Min 3X/week   Barriers to discharge        Co-evaluation               AM-PAC PT "6 Clicks" Mobility  Outcome Measure Help needed turning from your back to your side while in a flat bed without using bedrails?: None Help needed moving from lying on your back to sitting on the side of a flat bed without using bedrails?: A Little Help needed moving to and from a bed to a chair (including a wheelchair)?: A Little Help needed standing up from a chair using your arms (e.g., wheelchair or  bedside chair)?: A Little Help needed to walk in hospital room?: A Little Help needed climbing 3-5 steps with a railing? : A Lot 6 Click Score: 18    End of Session Equipment Utilized During Treatment: Gait belt Activity Tolerance: Patient limited by fatigue Patient left: with call bell/phone within reach;with nursing/sitter in room;Other (comment)(Sitting EOB - RN aware) Nurse Communication: Mobility status PT Visit Diagnosis: Unsteadiness on feet (R26.81);Difficulty in walking, not elsewhere classified (R26.2)    Time: 1030-1058 PT Time Calculation (min) (ACUTE ONLY): 28 min   Charges:   PT Evaluation $PT Eval Moderate Complexity: 1 Mod PT Treatments $Gait Training: 8-22 mins        Rolinda Roan, PT, DPT Acute Rehabilitation Services Pager: [phone] Office: [phone]   Thelma Comp 04/17/2019, 12:10 PM

## 2019-04-17 NOTE — Plan of Care (Signed)
  Problem: Education: Goal: Knowledge of General Education information will improve Description: Including pain rating scale, medication(s)/side effects and non-pharmacologic comfort measures Outcome: Progressing   Problem: Health Behavior/Discharge Planning: Goal: Ability to manage health-related needs will improve Outcome: Progressing   Problem: Activity: Goal: Risk for activity intolerance will decrease Outcome: Progressing   

## 2019-04-17 NOTE — Progress Notes (Signed)
Paged by staff RN in regards to patient starting insulin pump back. Patient is to have own insulin pump supplies at bedside in order to start a new site. A vial of insulin was ordered so patient could fill her insulin pump reservoir. Staff RN to observe patient in filling her pump and starting back the insulin pump at this time. Patient will need to sign a contract in order to use insulin pump while in the hospital.   If patient is not able to get insulin pump restarted on her own, patient will need to be on SQ insulin until she can get more education on using insulin pump after discharge.  Harvel Ricks RN BSN CDE Diabetes Coordinator Pager: [phone]  8am-5pm

## 2019-04-17 NOTE — Progress Notes (Signed)
Family Medicine Teaching Service Daily Progress Note Intern Pager: [phone]  Patient name: Kamylah Manzo Medical record number: [id number] Date of birth: [birth year] Age: 68 y.o. Gender: female  Primary Care Provider: Shirley, [name], DO Consultants: none Code Status: full  Pt Overview and Major Events to Date:  4/21 admitted with DKA and ileus  Assessment and Plan: Felicitas Sine Brigette Hopfer is a 68 y.o. female who presented with nausea, vomiting, diarrhea and was found to be in DKA. PMH is significant for atrial fibrillation, T1DM on insulin pump, multiple strokes, hypothyroidism 2/2 thyroidectomy for thyroid cancer, HTN, HLD, GERD, depression/anxiety, chronic back pain.   Resolved DKA in uncontrolled T1DM. (In the setting of now resolved ileus). anion gap closed x2. Abdominal pain resolved with normal bowel sounds and patient has started BM. Now on levemir and off insulin gtt -floor RN to replace pump, settings from outpatient enodcrinologist are in DM coordinators note from 4/22 - Q1hr cbgs for the first 4hrs on personal pump - monitor BMs - Tylenol PRN - Phenergan prn  Sore throat and fatigue. No fever or SOB. No known sick contacts. Low likelihood of COVID however new onset sore throat today. - novel coronvirus test pending, not to change treatment but for infection concerns - droplet and contact precautions - chloraseptic spray prn  AKI 2/2 Dehydration, resolved: Cr 0.87 4/23  - monitor on BMP  - avoid nephrotoxic medications  Hypokalemia, resolved. K 3.3 today - replete with 40 mEq - monitor with AM BMP  Atrial Fibrillation  H/o multiple CVAs. Stable. Rate controlled - restart home eliquis and metoprolol  Hypothyroidism: -  home Synthroid 180mcg QD.  HFpEF and HTN: Soft BPs today 90-100s/50s. - restarting home metoprolol as per above - holding home losartan and torsemide for AKI   HLD:  - restart home atorvastatin  Chronic Back Pain: Home  meds include: Voltaren 1% gel, Gabapentin 300mg  qHS. - Hold home meds while NPO  GERD: - continue protonix  Depression/Anxiety: - restart home prozac   FEN/GI: soft diet, protonix PPx: eliquis  Disposition: med surg pending PT eval and stabilization on outpatient insulin pump, potentially 4/23 afternoon  Subjective:  Nervous about getting her rollator to the floor for PT test and wants her insulin pump set up prior to DC  Objective: Temp:  [97.9 F (36.6 C)-98.5 F (36.9 C)] 98.1 F (36.7 C) (04/22 2332) Pulse Rate:  [68-102] 68 (04/22 2332) Resp:  [18-32] 28 (04/22 2332) BP: (75-140)/(41-128) 140/128 (04/22 2332) SpO2:  [98 %-100 %] 98 % (04/22 2332) Weight:  [114.3 kg] 114.3 kg (04/22 1525) Physical Exam: General: NAD, comfortable in bed, nervous about location of insulin for her outpatient pump Cardiovascular: irregular rate, not tachy, no murmurs Respiratory: CTAB, No IWB, no wheeze/stridor Abdomen: no TTP, soft, no rebound, normal bowel sounds Extremities: warm, no deficits noted  Laboratory: Recent Labs  Lab 04/15/19 0958 04/15/19 1333 04/16/19 0612  WBC 7.0  --  8.2  HGB 13.1 15.0 11.5*  HCT 42.2 44.0 35.7*  PLT 244  --  220   Recent Labs  Lab 04/15/19 0958  04/16/19 1703 04/16/19 2014 04/17/19 0432  NA 134*   < > 137 137 132*  K 3.1*   < > 3.7 4.4 4.2  CL 101   < > 114* 115* 95*  CO2 14*   < > 11* 11* 27  BUN 34*   < > 34* 31* 9  CREATININE 2.41*   < > 1.64* 1.64* 0.87  CALCIUM 9.2   < > 7.9* 7.9* 8.8*  PROT 7.6  --   --   --   --   BILITOT 0.7  --   --   --   --   ALKPHOS 94  --   --   --   --   ALT 19  --   --   --   --   AST 16  --   --   --   --   GLUCOSE 388*   < > 126* 259* 98   < > = values in this interval not displayed.    Hemoglobin a1c 9.3  Imaging/Diagnostic Tests: No results found.  Sherene Sires, DO 04/17/2019, 7:36 AM PGY-2, [place] Intern pager: [phone], text pages welcome

## 2019-04-17 NOTE — TOC Progression Note (Signed)
Transition of Care [facility]) - Progression Note    Patient Details  Name: Meckenzie Balsley MRN: [id number] Date of Birth: 1951/05/16  Transition of Care [facility]) CM/SW Contact  Eileen Stanford, LCSW Phone Number: 04/17/2019, 4:02 PM  Clinical Narrative:   Pt would like presume outpatient PT on [place] instead of home health. Referral made. Pt states she uses [facility] to get there and back.    Expected Discharge Plan: Home/Self Care Barriers to Discharge: Continued Medical Work up  Expected Discharge Plan and Services Expected Discharge Plan: Home/Self Care   Discharge Planning Services: NA Post Acute Care Choice: NA Living arrangements for the past 2 months: Single Family Home                 DME Arranged: N/A DME Agency: NA                   Social Determinants of Health (SDOH) Interventions    Readmission Risk Interventions Readmission Risk Prevention Plan 04/17/2019  Transportation Screening Complete  PCP or Specialist Appt within 3-5 Days Complete  HRI or [place] Complete  Social Work Consult for [place] Planning/Counseling Complete  Palliative Care Screening Not Applicable  Medication Review [employer]) Complete  Some recent data might be hidden

## 2019-04-18 DIAGNOSIS — Z9641 Presence of insulin pump (external) (internal): Secondary | ICD-10-CM

## 2019-04-18 LAB — GLUCOSE, CAPILLARY
Glucose-Capillary: 103 mg/dL — ABNORMAL HIGH (ref 70–99)
Glucose-Capillary: 110 mg/dL — ABNORMAL HIGH (ref 70–99)
Glucose-Capillary: 157 mg/dL — ABNORMAL HIGH (ref 70–99)
Glucose-Capillary: 209 mg/dL — ABNORMAL HIGH (ref 70–99)

## 2019-04-18 LAB — BASIC METABOLIC PANEL
Anion gap: 6 (ref 5–15)
BUN: 20 mg/dL (ref 8–23)
CO2: 14 mmol/L — ABNORMAL LOW (ref 22–32)
Calcium: 8.2 mg/dL — ABNORMAL LOW (ref 8.9–10.3)
Chloride: 120 mmol/L — ABNORMAL HIGH (ref 98–111)
Creatinine, Ser: 1.06 mg/dL — ABNORMAL HIGH (ref 0.44–1.00)
GFR calc Af Amer: >60 mL/min (ref >60)
GFR calc non Af Amer: 54 mL/min — ABNORMAL LOW (ref >60)
Glucose, Bld: 114 mg/dL — ABNORMAL HIGH (ref 70–99)
Potassium: 3 mmol/L — ABNORMAL LOW (ref 3.5–5.1)
Sodium: 140 mmol/L (ref 135–145)

## 2019-04-18 LAB — CBC
HCT: 31.2 % — ABNORMAL LOW (ref 36.0–46.0)
Hemoglobin: 10.2 g/dL — ABNORMAL LOW (ref 12.0–15.0)
MCH: 27.9 pg (ref 26.0–34.0)
MCHC: 32.7 g/dL (ref 30.0–36.0)
MCV: 85.2 fL (ref 80.0–100.0)
Platelets: 229 10*3/uL (ref 150–400)
RBC: 3.66 MIL/uL — ABNORMAL LOW (ref 3.87–5.11)
RDW: 14.4 % (ref 11.5–15.5)
WBC: 8 10*3/uL (ref 4.0–10.5)
nRBC: 0 % (ref 0.0–0.2)

## 2019-04-18 MED ORDER — POTASSIUM CHLORIDE CRYS ER 20 MEQ PO TBCR
40.0000 meq | EXTENDED_RELEASE_TABLET | Freq: Two times a day (BID) | ORAL | Status: DC
  Administered 2019-04-18: 40 meq via ORAL
  Filled 2019-04-18 (×2): qty 2

## 2019-04-18 NOTE — Care Management Important Message (Signed)
Important Message  Patient Details  Name: [name] MRN: [id number] Date of Birth: March 15, 1951   Medicare Important Message Given:  Yes    Gerrianne Scale Jayquan Bradsher, LCSW 04/18/2019, 10:23 AM

## 2019-04-18 NOTE — Care Management Important Message (Deleted)
Important Message  Patient Details  Name: [name] MRN: [id number] Date of Birth: [birth year]   Medicare Important Message Given:  Yes    Zenon Mayo, RN 04/18/2019, 2:19 PM

## 2019-04-18 NOTE — Progress Notes (Signed)
Family Medicine Teaching Service Daily Progress Note Intern Pager: [phone]  Patient name: [name] Medical record number: [id number] Date of birth: [birth year] Age: 67 y.o. Gender: female  Primary Care Provider: Shirley, [name], DO Consultants: none Code Status: full  Pt Overview and Major Events to Date:  4/21 admitted with DKA and ileus  Assessment and Plan: [name] is a 68 y.o. female who presented with nausea, vomiting, diarrhea and was found to be in DKA. PMH is significant for atrial fibrillation, T1DM on insulin pump, multiple strokes, hypothyroidism 2/2 thyroidectomy for thyroid cancer, HTN, HLD, GERD, depression/anxiety, chronic back pain.   DKA in the setting of T1DM and ileus: Resolved.  Gap remains closed.  Started back on home insulin pump last night, glucose normalizing 100-200s.  Additionally back to normal bowel function, tolerating diet. - Continue home insulin pump - Continue diet as tolerated - Monitor CBGs with meals and nightly  Sore throat and fatigue: Resolved.  afebrile.  COVID negative. - Chloraseptic spray as needed  AKI 2/2 Dehydration: resolved Creatinine 1.06, from 0.87 yesterday.  However baseline appears around 1. - Encourage fluid hydration at home -Avoid nephrotoxic medications as possible  Hypokalemia: Recurrent. K 3.0, likely in setting of increased insulin. - Replete with 40 mEq x 2 - Monitor BMP  Atrial Fibrillation  H/o multiple CVAs: Stable. Rate controlled -Continue home eliquis and metoprolol  Hypothyroidism: -  home Synthroid 173mcg QD.  HFpEF and HTN:  BP ranging 100-120s.  -Continue home metoprolol as per above -Held home losartan and torsemide in setting of AKI  HLD:  -Continue home atorvastatin  Chronic Back Pain: Home meds include: Voltaren 1% gel, Gabapentin 300mg  qHS. - Will restart at discharge  GERD: - continue protonix  Depression/Anxiety: -Continue home  prozac   FEN/GI: soft diet, protonix PPx: eliquis  Disposition: Likely DC home today  Subjective:  Doing well this morning, only complains that she has not had breakfast yet.  Has her insulin pump back on, feels comfortable managing it at home.  Denies any further sore throat, feels back to her normal.  Denies any further abdominal pains, nausea, or vomiting, had a normal bowel movement yesterday morning.  Objective: Temp:  [97.8 F (36.6 C)-98.3 F (36.8 C)] 97.8 F (36.6 C) (04/24 0751) Pulse Rate:  [67-84] 67 (04/24 0751) Resp:  [12-16] 16 (04/24 0751) BP: (118-128)/(48-57) 118/55 (04/24 0751) SpO2:  [95 %-100 %] 95 % (04/24 0751) Physical Exam: General: Alert, NAD, laying comfortably in bed trying to rest HEENT: NCAT, MMM  Cardiac: Irregular rate without murmurs noted Lungs: Clear bilaterally, no increased WOB  Abdomen: soft, non-tender, non-distended, normoactive BS.  Insulin pump in place on left lower abdomen. Msk: Moves all extremities spontaneously  Ext: Warm, dry, 2+ distal pulses, no edema   Laboratory: Recent Labs  Lab 04/16/19 0612 04/17/19 0841 04/18/19 0453  WBC 8.2 8.0 8.0  HGB 11.5* 11.2* 10.2*  HCT 35.7* 35.1* 31.2*  PLT 220 229 229   Recent Labs  Lab 04/15/19 0958  04/16/19 2014 04/17/19 0432 04/18/19 0453  NA 134*   < > 137 132* 140  K 3.1*   < > 4.4 4.2 3.0*  CL 101   < > 115* 95* 120*  CO2 14*   < > 11* 27 14*  BUN 34*   < > 31* 9 20  CREATININE 2.41*   < > 1.64* 0.87 1.06*  CALCIUM 9.2   < > 7.9* 8.8* 8.2*  PROT 7.6  --   --   --   --  BILITOT 0.7  --   --   --   --   ALKPHOS 94  --   --   --   --   ALT 19  --   --   --   --   AST 16  --   --   --   --   GLUCOSE 388*   < > 259* 98 114*   < > = values in this interval not displayed.    Hemoglobin a1c 9.3  Imaging/Diagnostic Tests: No results found.  Patriciaann Clan, DO 04/18/2019, 8:30 AM PGY-1, [place] Intern pager: [phone], text pages welcome

## 2019-04-18 NOTE — Discharge Instructions (Signed)
We are so glad that we were able to be part of your care here at [place] were admitted for high sugars (DKA) and monitoring your belly pains.  We started you on an insulin drip and fluids, over time your sugars improved and you were able to return back to her insulin pump.  We are also happy after advancing your diet slowly, you now are no longer have any belly pains and having good bowel movements.  Please make sure you continue to watch your glucose closely.  Seek care if you start having vomiting, abdominal pains, consistently elevated sugars (>300), or any other concerns.  Please make sure you follow-up with your primary care provider, we have a virtual office visit scheduled for you next week.      Information on my medicine - ELIQUIS (apixaban)  This medication education was reviewed with me or my healthcare representative as part of my discharge preparation.   Why was Eliquis prescribed for you? Eliquis was prescribed for you to reduce the risk of forming blood clots that can cause a stroke if you have a medical condition called atrial fibrillation (a type of irregular heartbeat) OR to reduce the risk of a blood clots forming after orthopedic surgery.  What do You need to know about Eliquis ? Take your Eliquis TWICE DAILY - one tablet in the morning and one tablet in the evening with or without food.  It would be best to take the doses about the same time each day.  If you have difficulty swallowing the tablet whole please discuss with your pharmacist how to take the medication safely.  Take Eliquis exactly as prescribed by your doctor and DO NOT stop taking Eliquis without talking to the doctor who prescribed the medication.  Stopping may increase your risk of developing a new clot or stroke.  Refill your prescription before you run out.  After discharge, you should have regular check-up appointments with your healthcare provider that is prescribing your Eliquis.   In the future your dose may need to be changed if your kidney function or weight changes by a significant amount or as you get older.  What do you do if you miss a dose? If you miss a dose, take it as soon as you remember on the same day and resume taking twice daily.  Do not take more than one dose of ELIQUIS at the same time.  Important Safety Information A possible side effect of Eliquis is bleeding. You should call your healthcare provider right away if you experience any of the following: ? Bleeding from an injury or your nose that does not stop. ? Unusual colored urine (red or dark brown) or unusual colored stools (red or black). ? Unusual bruising for unknown reasons. ? A serious fall or if you hit your head (even if there is no bleeding).  Some medicines may interact with Eliquis and might increase your risk of bleeding or clotting while on Eliquis. To help avoid this, consult your healthcare provider or pharmacist prior to using any new prescription or non-prescription medications, including herbals, vitamins, non-steroidal anti-inflammatory drugs (NSAIDs) and supplements.  This website has more information on Eliquis (apixaban): [email or web address].

## 2019-04-18 NOTE — Progress Notes (Signed)
Physical Therapy Treatment Patient Details Name: [name] MRN: [id number] DOB: 08/22/1951 Today's Date: 04/18/2019    History of Present Illness Pt is a 68 y/o female admitted 04/15/19 with N/V/D. She was found to be in DKA and tested COVID-19 negative. PMH significant for Type 1 DM, thyroid CA, CVA, hypothyroidism, HTN, CHF.   PT Comments    Pt progressing with mobility. Able to increase ambulation distance with DOE 3/4; moving at supervision-level. Pt hopeful for return home today; will have necessary support from family. If to remain admitted, will follow acutely.    Follow Up Recommendations  Outpatient PT;Supervision/Assistance - 24 hour(declined HHPT, wants to continue with OP PT)     Equipment Recommendations  None recommended by PT    Recommendations for Other Services       Precautions / Restrictions Precautions Precautions: Fall Restrictions Weight Bearing Restrictions: No    Mobility  Bed Mobility Overal bed mobility: Modified Independent                 Transfers Overall transfer level: Needs assistance Equipment used: 4-wheeled walker Transfers: Sit to/from Stand Sit to Stand: Supervision         General transfer comment: Locked brakes before standing without cues; quick descent to sit upon return to room due to fatigue without locking brakes. Educ on fall risk reduction with this  Ambulation/Gait Ambulation/Gait assistance: Supervision Gait Distance (Feet): 170 Feet Assistive device: 4-wheeled walker Gait Pattern/deviations: Step-through pattern;Decreased stride length;Trunk flexed Gait velocity: Decreased Gait velocity interpretation: 1.31 - 2.62 ft/sec, indicative of limited community ambulator General Gait Details: Slow, steady gait with rollator; supervision for safety. DOE 3/4 while walking; unable to get pulse ox read with ambulation   Stairs             Wheelchair Mobility    Modified Rankin (Stroke Patients  Only)       Balance Overall balance assessment: Needs assistance Sitting-balance support: Feet supported;No upper extremity supported Sitting balance-Leahy Scale: Good     Standing balance support: Single extremity supported;During functional activity Standing balance-Leahy Scale: Fair Standing balance comment: Can static stand without UE support                            Cognition Arousal/Alertness: Awake/alert Behavior During Therapy: WFL for tasks assessed/performed Overall Cognitive Status: Within Functional Limits for tasks assessed                                        Exercises      General Comments        Pertinent Vitals/Pain Pain Assessment: No/denies pain    Home Living                      Prior Function            PT Goals (current goals can now be found in the care plan section) Acute Rehab PT Goals Patient Stated Goal: Be able to go back to [address]  PT Goal Formulation: With patient Time For Goal Achievement: 04/24/19 Potential to Achieve Goals: Good Progress towards PT goals: Progressing toward goals    Frequency    Min 3X/week      PT Plan Discharge plan needs to be updated    Co-evaluation  AM-PAC PT "6 Clicks" Mobility   Outcome Measure  Help needed turning from your back to your side while in a flat bed without using bedrails?: None Help needed moving from lying on your back to sitting on the side of a flat bed without using bedrails?: None Help needed moving to and from a bed to a chair (including a wheelchair)?: None Help needed standing up from a chair using your arms (e.g., wheelchair or bedside chair)?: A Little Help needed to walk in hospital room?: A Little Help needed climbing 3-5 steps with a railing? : A Lot 6 Click Score: 20    End of Session Equipment Utilized During Treatment: Gait belt Activity Tolerance: Patient tolerated treatment well;Patient  limited by fatigue Patient left: (sitting EOB) Nurse Communication: Mobility status PT Visit Diagnosis: Unsteadiness on feet (R26.81);Difficulty in walking, not elsewhere classified (R26.2)     Time: 4536-4680 PT Time Calculation (min) (ACUTE ONLY): 15 min  Charges:  $Gait Training: 8-22 mins                    Mabeline Caras, PT, DPT Acute Rehabilitation Services  Pager [phone] Office [place] 04/18/2019, 9:13 AM

## 2019-04-18 NOTE — Plan of Care (Signed)
  Problem: Education: Goal: Knowledge of General Education information will improve Description: Including pain rating scale, medication(s)/side effects and non-pharmacologic comfort measures Outcome: Progressing   Problem: Health Behavior/Discharge Planning: Goal: Ability to manage health-related needs will improve Outcome: Progressing   Problem: Nutrition: Goal: Adequate nutrition will be maintained Outcome: Progressing   

## 2019-04-18 NOTE — Discharge Summary (Signed)
[facility] Discharge Summary  Patient name: [name] Medical record number: [id number] Date of birth: 31-Oct-1951 Age: 68 y.o. Gender: female Date of Admission: 04/15/2019  Date of Discharge: 04/18/2019 Admitting Physician: Rory Percy, DO  Primary Care Provider: Shirley, [name], DO Consultants: None   Indication for Hospitalization: Mild DKA   Discharge Diagnoses/Problem List:  Mild DKA in the setting of T1DM, resolved Ileus, resolved AKI, resolved Hypokalemia Atrial fibrillation Previous multiple CVAs Hypothyroidism HFpEF Hypertension Chronic back pain GERD Depression/anxiety Hyperlipidemia  Disposition: Home with [facility] PT  Discharge Condition: Stable  Discharge Exam:  General: Alert, NAD, laying comfortably HEENT: NCAT, MMM  Cardiac: Irregular rate without murmurs noted Lungs: Clear bilaterally, no increased WOB  Abdomen: soft, non-tender, non-distended, normoactive BS.  Insulin pump in place on left lower abdomen. Msk: Moves all extremities spontaneously  Ext: Warm, dry, 2+ distal pulses, no edema   Brief Hospital Course:  Ms. Larkin is a 68 year old female with a history significant for T1DM on insulin pump who presented with a 5-day history of vomiting and diarrhea, found to be in mild DKA with ileus.  Mild DKA: Resolved.  Initial labs: glucose 388, AG 19, CO2 14, K 3.1.  Neurologically intact, however with associated abdominal pain as discussed below.  She was started on insulin drip and fluids, after her AG was closed x2 she was transitioned to SQ insulin on HD#1.  She was monitored back on her insulin pump for > 24 hours without concern. CBG 100-200 by the end of her stay.   Ileus in setting of DKA: Resolved.  Reported abdominal pain with diarrhea initially, then subsequent constipation with hypoactive/absent bowel sounds. CT abd/pelvis showing moderate bowel distention without evidence of SBO. Over the course of her  stay, she advanced her diet slowly with fluid hydration with normalized bowel function.   At discharge, she was hemodynamically stable with no further abdominal pains/V/D and tolerating full soft diet.  Issues for Follow Up:  1. Please follow up with a BMP to monitor Cr, K, and signs of acidosis. Cr 1.06 at discharge (slight bump up from 0.87 the day prior but baseline ~1).  2. Ensure she continues to tolerate a diet without any further vomiting, abd pain, or diarrhea/constipation. 3. Please follow-up BP, home losartan held during admission and at discharge. SBP 100-120 during stay.  4. Endorsed sore throat and fatigue during stay, otherwise afebrile and no SOB. COVID test negative. Resolved by d/c, ensure no further symptomatology.  Significant Procedures: None   Significant Labs and Imaging:  Recent Labs  Lab 04/16/19 0612 04/17/19 0841 04/18/19 0453  WBC 8.2 8.0 8.0  HGB 11.5* 11.2* 10.2*  HCT 35.7* 35.1* 31.2*  PLT 220 229 229   Recent Labs  Lab 04/15/19 0958  04/15/19 1803  04/16/19 1004 04/16/19 1703 04/16/19 2014 04/17/19 0432 04/18/19 0453  NA 134*   < > 138   < > 135 137 137 132* 140  K 3.1*   < > 2.9*   < > 3.3* 3.7 4.4 4.2 3.0*  CL 101  --  110   < > 114* 114* 115* 95* 120*  CO2 14*  --  16*   < > 13* 11* 11* 27 14*  GLUCOSE 388*  --  179*   < > 246* 126* 259* 98 114*  BUN 34*  --  34*   < > 34* 34* 31* 9 20  CREATININE 2.41*  --  1.91*   < >  1.76* 1.64* 1.64* 0.87 1.06*  CALCIUM 9.2  --  8.4*   < > 7.8* 7.9* 7.9* 8.8* 8.2*  MG  --   --  1.5*  --   --   --   --   --   --   ALKPHOS 94  --   --   --   --   --   --   --   --   AST 16  --   --   --   --   --   --   --   --   ALT 19  --   --   --   --   --   --   --   --   ALBUMIN 3.4*  --   --   --   --   --   --   --   --    < > = values in this interval not displayed.    Ct Abdomen Pelvis Wo Contrast  Result Date: 04/15/2019 CLINICAL DATA:  Acute onset of nausea, vomiting and diarrhea that began 5 days ago,  associated with LEFT LOWER QUADRANT abdominal pain. Surgical history includes hysterectomy. EXAM: CT ABDOMEN AND PELVIS WITHOUT CONTRAST TECHNIQUE: Multidetector CT imaging of the abdomen and pelvis was performed following the standard protocol without IV contrast. Oral contrast was administered, though the patient vomited the majority of the contrast. COMPARISON:  11/16/2018. FINDINGS: Lower chest: Linear atelectasis involving the RIGHT MIDDLE LOBE, lingula and to a lesser degree the BILATERAL lower lobes. Heart mildly enlarged. Severe three-vessel coronary atherosclerosis. Hepatobiliary: Normal unenhanced appearance of the liver. Borderline gallbladder distention without evidence of cholelithiasis or cholecystitis. No biliary ductal dilation. Pancreas: Atrophic as noted previously. No mass or peripancreatic inflammation. Spleen: Normal unenhanced appearance. Adrenals/Urinary Tract: Normal appearing adrenal glands. No evidence of urinary tract calculi. Within the limits of the unenhanced technique, no focal parenchymal abnormality involving either kidney. No evidence of hydronephrosis involving either kidney. Normal appearing urinary bladder. Stomach/Bowel: Stomach normal in appearance for the degree of distention. Moderate diffuse small bowel distension without evidence of a transition site. Mild distension of the ascending and transverse colon, with the descending colon, sigmoid colon and rectum decompressed. Liquid stool in the colon. Postsurgical changes adjacent to the cecum, including minimal edema/fluid in the RIGHT paracolic gutter. No evidence of free intraperitoneal air. Vascular/Lymphatic: Mild-to-moderate aortoiliofemoral and visceral artery atherosclerosis without evidence of aneurysm. No pathologic lymphadenopathy. Reproductive: Surgically absent uterus. No adnexal masses. Other: Small amount of free fluid dependently in the pelvis. Small umbilical hernia containing fat. Musculoskeletal: Multilevel  degenerative disc disease, spondylosis and facet degenerative changes throughout the lumbar spine. Degenerative disc disease throughout the visualized thoracic spine. Compression fracture of the UPPER endplate of L2, unchanged since the prior CT. Mild compression fracture of the LOWER endplate of T9, new since the prior examination. Mild degenerative changes involving both hips. IMPRESSION: 1. Moderate diffuse small bowel distension without evidence of a transition point, and mild distension of the ascending and transverse colon. Ileus is favored over bowel obstruction. 2. Small amount of ascites dependently in the pelvis. 3. Linear atelectasis involving the visualized lung bases. 4. Borderline gallbladder distention without evidence of cholelithiasis or cholecystitis. 5.  Aortic Atherosclerosis (ICD10-170.0) Electronically Signed   By: Evangeline Dakin M.D.   On: 04/15/2019 13:46   Dg Chest 2 View  Result Date: 04/15/2019 CLINICAL DATA:  Pt here from home with c/o n/v since Thursday, pt also c/o sob  and a sore throat.Shortness of breath today EXAM: CHEST - 2 VIEW COMPARISON:  07/03/2011 FINDINGS: Normal cardiac silhouette. Small linear opacity adjacent to the LEFT heart border within the lingula. LEFT upper lobe clear. RIGHT lung clear. No pneumothorax. No pleural fluid. IMPRESSION: Atelectasis versus infiltrate in the lingular lobe. Electronically Signed   By: Suzy Bouchard M.D.   On: 04/15/2019 10:27   Results/Tests Pending at Time of Discharge: None   Discharge Medications:  Allergies as of 04/18/2019      Reactions   Latex Rash      Medication List    STOP taking these medications   losartan 100 MG tablet Commonly known as:  COZAAR     TAKE these medications   acetaminophen 500 MG tablet Commonly known as:  TYLENOL Take 1,000 mg by mouth as needed for mild pain.   apixaban 5 MG Tabs tablet Commonly known as:  Eliquis Take 1 tablet (5 mg total) by mouth 2 (two) times daily.    atorvastatin 80 MG tablet Commonly known as:  LIPITOR Take 1 tablet (80 mg total) by mouth daily.   CRANBERRY EXTRACT PO Take 1 capsule by mouth daily. Notes to patient:  please resume per home routine   diclofenac sodium 1 % Gel Commonly known as:  VOLTAREN Apply 4 g topically 4 (four) times daily. Notes to patient:  Please resume per home routine   fexofenadine 30 MG tablet Commonly known as:  ALLEGRA Take 30 mg by mouth daily as needed (allergies).   FLUoxetine 10 MG capsule Commonly known as:  PROzac Take 1 capsule (10 mg total) by mouth daily.   gabapentin 300 MG capsule Commonly known as:  NEURONTIN Take 1 capsule (300 mg total) by mouth at bedtime.   insulin pump Soln Inject into the skin continuous. humalog insulin Notes to patient:  Please continue per home routine   levothyroxine 175 MCG tablet Commonly known as:  SYNTHROID Take 175 mcg by mouth daily before breakfast.   loperamide 2 MG tablet Commonly known as:  IMODIUM A-D Take 1 tablet (2 mg total) by mouth 4 (four) times daily as needed for diarrhea or loose stools.   metoprolol succinate 100 MG 24 hr tablet Commonly known as:  TOPROL-XL Take 1 tablet by mouth daily What changed:    how much to take  how to take this  when to take this  additional instructions   ondansetron 4 MG disintegrating tablet Commonly known as:  Zofran ODT Take 1 tablet (4 mg total) by mouth every 8 (eight) hours as needed for nausea or vomiting.   pantoprazole 40 MG tablet Commonly known as:  PROTONIX Take 1 tablet (40 mg total) by mouth daily.   potassium chloride 10 MEQ tablet Commonly known as:  K-DUR Take 1 tablet by mouth on days toresmide is taken   torsemide 10 MG tablet Commonly known as:  DEMADEX TAKE 1 TABLET BY MOUTH DAILY AS NEEDED FOR FLUID What changed:  See the new instructions.       Discharge Instructions: Please refer to Patient Instructions section of EMR for full details.  Patient was  counseled important signs and symptoms that should prompt return to medical care, changes in medications, dietary instructions, activity restrictions, and follow up appointments.   Follow-Up Appointments: Follow-up Information    [facility] Follow up.   Specialty:  Rehabilitation Why:  they will contact you for your appointment, if you have not heard from them in a couple  of days given them a call Contact information: [address] [place]. Call on 04/22/2019.   Specialty:  Family Medicine Why:  Hospital follow up visit: this will be virtual visit (you will be called for your appointment). at 1:30pm.  Contact information: [address] [id number] [place] [address] [place], [place], DO 04/18/2019, 5:06 PM PGY-1, [place]

## 2019-04-22 ENCOUNTER — Telehealth (INDEPENDENT_AMBULATORY_CARE_PROVIDER_SITE_OTHER): Payer: Medicare Other | Admitting: Family Medicine

## 2019-04-22 ENCOUNTER — Encounter: Payer: Self-pay | Admitting: Family Medicine

## 2019-04-22 ENCOUNTER — Other Ambulatory Visit: Payer: Self-pay

## 2019-04-22 DIAGNOSIS — I10 Essential (primary) hypertension: Secondary | ICD-10-CM | POA: Diagnosis not present

## 2019-04-22 DIAGNOSIS — E1065 Type 1 diabetes mellitus with hyperglycemia: Secondary | ICD-10-CM

## 2019-04-22 NOTE — Progress Notes (Signed)
[facility] Telemedicine Visit  Patient consented to have virtual visit. Method of visit: Telephone  Encounter participants: Patient: [name] - located at home Provider: Benay Pike - located at clinic Others (if applicable): None  Chief Complaint: Hospital follow-up for DKA.  HPI: Patient states she is doing much better since her hospital discharge.  She was very appreciative of the care she received by the family medicine team.  She is saying her diarrhea has slowed down and thinks it is stopped today's.  She has not had any diarrhea today.  She is also not having any nausea and would like to know if she should throw away her antiemetic medication.  Patient losartan was held at discharge given the patient's normal blood pressure range in the hospital.  Patient believed that she was supposed to no longer take this medication and through her losartan pills away.  Advised patient that she could always get a new prescription if we need to start her back on a blood pressure medicine.  Patient is not having any respiratory symptoms such as cough or shortness of breath.  ROS: per HPI  Pertinent PMHx: Diabetes, hypertension, A. fib, HFpEF  Exam:  Respiratory: Patient is able to speak in full sentences without difficulty.  No cough.  Assessment/Plan:  Essential hypertension Patient's losartan was held on discharge.  Patient threw all her losartan away and she got home.  Patient will need to follow-up with her PCP when we are able to start seeing patients on a regular basis again in the clinic.  If she needs to restart blood pressure medicine at that time we can restart her on losartan or another agent.  Uncontrolled type 1 diabetes mellitus ([place]) Patient not currently having any symptoms.  Nausea and vomiting have resolved, as has her diarrhea.  She is not having polydipsia polyuria.  Unable to check BMP at this time given this is a telemedicine  visit.  Patient will need to have her BMP checked again at some point in the coming months when brought back in for a regular follow-up visit with her PCP.    Time spent during visit with patient: 8 minutes

## 2019-04-22 NOTE — Assessment & Plan Note (Signed)
Patient not currently having any symptoms.  Nausea and vomiting have resolved, as has her diarrhea.  She is not having polydipsia polyuria.  Unable to check BMP at this time given this is a telemedicine visit.  Patient will need to have her BMP checked again at some point in the coming months when brought back in for a regular follow-up visit with her PCP.

## 2019-04-22 NOTE — Assessment & Plan Note (Signed)
Patient's losartan was held on discharge.  Patient threw all her losartan away and she got home.  Patient will need to follow-up with her PCP when we are able to start seeing patients on a regular basis again in the clinic.  If she needs to restart blood pressure medicine at that time we can restart her on losartan or another agent.

## 2019-04-29 ENCOUNTER — Other Ambulatory Visit: Payer: Self-pay

## 2019-04-29 ENCOUNTER — Telehealth (INDEPENDENT_AMBULATORY_CARE_PROVIDER_SITE_OTHER): Payer: Medicare Other | Admitting: Family Medicine

## 2019-04-29 ENCOUNTER — Encounter: Payer: Self-pay | Admitting: Family Medicine

## 2019-04-29 DIAGNOSIS — I5032 Chronic diastolic (congestive) heart failure: Secondary | ICD-10-CM

## 2019-04-29 DIAGNOSIS — E1065 Type 1 diabetes mellitus with hyperglycemia: Secondary | ICD-10-CM

## 2019-04-29 DIAGNOSIS — E1059 Type 1 diabetes mellitus with other circulatory complications: Secondary | ICD-10-CM

## 2019-04-29 DIAGNOSIS — I10 Essential (primary) hypertension: Secondary | ICD-10-CM | POA: Diagnosis not present

## 2019-04-29 MED ORDER — LOSARTAN POTASSIUM 25 MG PO TABS: 25.0000 mg | ORAL_TABLET | Freq: Every day | ORAL | 3 refills | Status: DC

## 2019-04-29 NOTE — Assessment & Plan Note (Addendum)
Patient's losartan was previously held on discharge.  She states she threw away her medicine when she got home.  Last BMP on 4/24 with GFR greater than 60 and creatinine 1.06.  Patient previously on losartan 100 mg.  Given that patient also has history of HFpEF and there is mortality benefit, will restart losartan at decreased dose of 25 mg for patient to take nightly.  Blood pressure on home cuff 140/67 and patient denies any associated symptoms.  She is to call if she begins to have any symptoms.   Patient encouraged to continue her weight-based torsemide. She is to supplement with potassium when she takes this.  Patient will need to complete sleep study for likely OSA once we are able to have this outpatient procedure performed.

## 2019-04-29 NOTE — Progress Notes (Signed)
Medical screening examination/treatment/procedure(s) were performed by a resident. While this patient encounter was not precepted with me during this visit; I was one of the two supervising physician/preceptor available for consultation/collaboration for today.  

## 2019-04-29 NOTE — Assessment & Plan Note (Signed)
Patient's losartan was previously held on discharge.  She states she threw away her medicine when she got home.  Last BMP on 4/24 with GFR greater than 60 and creatinine 1.06.  Patient previously on losartan 100 mg.  Given that patient also has history of HFpEF and there is mortality benefit, will restart losartan at decreased dose of 25 mg for patient to take nightly.  Blood pressure on home cuff 140/67 and patient denies any associated symptoms.  She is to call if she begins to have any symptoms.

## 2019-04-29 NOTE — Assessment & Plan Note (Signed)
She is asymptomatic.  Nausea and vomiting have resolved since her discharge from the hospital.  Denies any polyuria and polydipsia.  Patient reports that her sugars have been running between 100-120s.  States that she has improved her diet and has been exercising and has not felt as good as she does now on a long time.  Patient with hypokalemia while admitted to hospital. Unable to obtain BMP at this time given this is telemedicine visit.  However patient with potassium supplements at home that she takes when she also takes furosemide and will be restarting her losartan which will also help with her potassium.  Denies any chest pain.  Patient will need BMP at next office visit.

## 2019-04-29 NOTE — Progress Notes (Signed)
[facility] Telemedicine Visit  Patient consented to have virtual visit. Method of visit: Video  Encounter participants: Patient: [name] - located at home Provider: [name] Dede Dobesh - located at [facility] clinic Others (if applicable): none  Chief Complaint: refill of meds  HPI: Patient reports that she is feeling great that she has left the hospital.  States that she threw away her losartan because she was told to stop it at discharge.  She is calling again today to see if we need to restart this medication.  Reports that she has not had any chest pain, shortness of breath, dizziness or lightheadedness.  She also denies any headaches or vision changes.  BP 140/67 per home device.    States that she has been walking around her house and trying to do physical therapy exercises she was previously taught.  States she is not currently receiving home health therapy and is supposed to continue this when they reopen the outpatient physical therapy.  Patient states they did come by twice and she has been completing his exercises.  States that she has been self isolating but is also going outside periodically to get sunshine.  Patient states that she has been eating 3 meals a day, watching her junk food intake and doing the exercises.  States that she has noticed that her sugars have been running between 102-120 which is exponentially better than the have been in a long time.  She denies any low blood sugars.    Patient does report that when she lays flat at night she has been experiencing some shortness of breath but she believes this is due to her needing her sleep study that was previously ordered as she thinks she may have sleep apnea.  Patient states that if she props herself up slightly that she has no trouble breathing.  Patient reports that she has been taking her torsemide and potassium when she notices any weight gain or leg swelling.  States that her last time  she took the pills was yesterday because she noticed her leg swelling.  Her baseline weight at home is 250 pounds based on her scales.  States that her weights morning is 250 pounds, and she is currently not having any leg swelling after taking the torsemide yesterday.  She wears a Fitbit daily which helps report her heart rate and she states that it has mostly been normalized.  She reports compliance with her medications.  States she has not felt this good in a really long time.  ROS: per HPI  Pertinent PMHx:  Atrial fibrillation Previous multiple CVAs Hypothyroidism HFpEF Hypertension Chronic back pain GERD Depression/anxiety Hyperlipidemia  Exam:  Vitals obtained by patient: BP 140/67, HR 80, weight 250 pounds  General: NAD, well-appearing, pleasant Respiratory: Patient able to speak in full sentences without difficulty.  No cough noted.  MSK: Patient able to get up from chair and walk across room without difficulty  Assessment/Plan:  Essential hypertension  HFpEF Patient's losartan was previously held on discharge.  She states she threw away her medicine when she got home.  Last BMP on 4/24 with GFR greater than 60 and creatinine 1.06.  Patient previously on losartan 100 mg.  Given that patient also has history of HFpEF and there is mortality benefit, will restart losartan at decreased dose of 25 mg for patient to take nightly.  Blood pressure on home cuff 140/67 and patient denies any associated symptoms.  She is to call if she  begins to have any symptoms.   Patient encouraged to continue her weight-based torsemide. She is to supplement with potassium when she takes this.  Patient will need to complete sleep study for likely OSA once we are able to have this outpatient procedure performed.  Uncontrolled type 1 diabetes mellitus ([place]) She is asymptomatic.  Nausea and vomiting have resolved since her discharge from the hospital.  Denies any polyuria and polydipsia.  Patient  reports that her sugars have been running between 100-120s.  States that she has improved her diet and has been exercising and has not felt as good as she does now on a long time.  Patient with hypokalemia while admitted to hospital. Unable to obtain BMP at this time given this is telemedicine visit.  However patient with potassium supplements at home that she takes when she also takes furosemide and will be restarting her losartan which will also help with her potassium.  Denies any chest pain.  Patient will need BMP at next office visit.   Time spent during visit with patient: 14 minutes  [name] Nolyn Eilert, DO PGY-2, [place]

## 2019-04-29 NOTE — Progress Notes (Signed)
BP- WT- Attempted to call pt at 1:35pm. For her Video appt. Phone just went straight to voice mail.   Salvatore Marvel, CMA

## 2019-05-05 ENCOUNTER — Other Ambulatory Visit: Payer: Self-pay

## 2019-05-05 ENCOUNTER — Ambulatory Visit (INDEPENDENT_AMBULATORY_CARE_PROVIDER_SITE_OTHER): Payer: Medicare Other | Admitting: *Deleted

## 2019-05-05 DIAGNOSIS — I480 Paroxysmal atrial fibrillation: Secondary | ICD-10-CM

## 2019-05-05 DIAGNOSIS — I639 Cerebral infarction, unspecified: Secondary | ICD-10-CM

## 2019-05-05 LAB — CUP PACEART REMOTE DEVICE CHECK
Date Time Interrogation Session: 20200510160920
Implantable Pulse Generator Implant Date: 20191230

## 2019-05-15 NOTE — Progress Notes (Signed)
[facility] Summary Report / Loop Recorder 

## 2019-05-20 ENCOUNTER — Ambulatory Visit (HOSPITAL_COMMUNITY)
Admission: RE | Admit: 2019-05-20 | Discharge: 2019-05-20 | Disposition: A | Discharge status: Home / Self Care | Payer: Medicare Other | Source: Ambulatory Visit | Attending: Physician Assistant | Admitting: Physician Assistant

## 2019-05-20 ENCOUNTER — Other Ambulatory Visit: Payer: Self-pay

## 2019-05-20 ENCOUNTER — Encounter (HOSPITAL_COMMUNITY): Payer: Self-pay | Admitting: Physician Assistant

## 2019-05-20 VITALS — BP 121/81 | HR 73 | Ht 67.0 in

## 2019-05-20 DIAGNOSIS — I480 Paroxysmal atrial fibrillation: Secondary | ICD-10-CM | POA: Diagnosis not present

## 2019-05-20 NOTE — Progress Notes (Addendum)
Electrophysiology TeleHealth Note   Due to national recommendations of social distancing due to [place] 19, Audio/video telehealth visit is felt to be most appropriate for this patient at this time.  See consent below from today for patient consent regarding telehealth for the At[facility]illation Clinic. Consent obtained verbally.   Date:  05/20/2019   ID:  [name], DOB Oct 07, 1951, MRN [id number]  Location: home  Provider location: [address] [place], [place] [address] Evaluation Performed: Follow up  PCP:  Shirley, [name], DO  Primary Cardiologist:  Dr Marlou Porch Primary Electrophysiologist: Dr Curt Bears   CC: Follow up for atrial fibrillation   History of Present Illness: Debbera Wolken Brei [place] is a 68 y.o. female who presents via audio/video conferencing for a telehealth visit today. Patient was hospitalized about one month ago for DKA and ileus. Her loop recorder did show increased AF burden around the time of her admission. She reports that she was unaware of her arrhythmia. She states that she has been doing very well since then with no SOB or edema.   Today, she denies symptoms of palpitations, chest pain, shortness of breath, orthopnea, PND, lower extremity edema, claudication, dizziness, presyncope, syncope, bleeding, or neurologic sequela. The patient is tolerating medications without difficulties and is otherwise without complaint today.   she denies symptoms of cough, fevers, chills, or new SOB worrisome for COVID 19.     Atrial Fibrillation Risk Factors:  she does have symptoms or diagnosis of sleep apnea. Sleep study pending she does not have a history of rheumatic fever. she does not have a history of alcohol use. The patient does not have a history of early familial atrial fibrillation or other arrhythmias.  she has a BMI of Body mass index is 39.47 kg/m.Marland Kitchen There were no vitals filed for this visit.  Past Medical History:  Diagnosis Date  .  Abnormal echocardiogram   . Anemia   . Arthritis   . CHF (congestive heart failure) ([place])   . Depression   . GERD (gastroesophageal reflux disease)   . Hemorrhoids   . Hyperlipidemia   . Hypertension   . Hypothyroidism   . Seasonal allergies   . Stroke ([place])   . Thyroid cancer ([place])    per pt S/p Total Thyroidectomy with Radioactive Iodine Therapy  . Type 1 diabetes mellitus ([place])    Past Surgical History:  Procedure Laterality Date  . ABDOMINAL ANGIOGRAM N/A 05/14/2012   Procedure: ABDOMINAL ANGIOGRAM;  Surgeon: Laverda Page, MD;  [facility] LAB;  Service: Cardiovascular;  Laterality: N/A;  . ABDOMINAL HYSTERECTOMY  1995   partial  . ANKLE FRACTURE SURGERY Right   . CARDIAC CATHETERIZATION     with coronary angiogram  . COLONOSCOPY N/A 09/09/2014   Procedure: COLONOSCOPY;  Surgeon: Gatha Mayer, MD;  Location: [facility];  Service: Endoscopy;  Laterality: N/A;  . ESOPHAGOGASTRODUODENOSCOPY (EGD) WITH PROPOFOL N/A 12/03/2018   Procedure: ESOPHAGOGASTRODUODENOSCOPY (EGD) WITH PROPOFOL;  Surgeon: Doran Stabler, MD;  Location: [place];  Service: Gastroenterology;  Laterality: N/A;  . LEFT AND RIGHT HEART CATHETERIZATION WITH CORONARY ANGIOGRAM N/A 05/14/2012   Procedure: LEFT AND RIGHT HEART CATHETERIZATION WITH CORONARY ANGIOGRAM;  Surgeon: Laverda Page, MD;  Location: [facility] CATH LAB;  Service: Cardiovascular;  Laterality: N/A;  . LOOP RECORDER INSERTION N/A 12/23/2018   Procedure: LOOP RECORDER INSERTION;  Surgeon: Constance Haw, MD;  Location: [facility] CV LAB;  Service: Cardiovascular;  Laterality: N/A;  .  MINI METER   09/08/2014   ELECTRONIC INSULIN PUMP  . TEE WITHOUT CARDIOVERSION N/A 10/16/2018   Procedure: TRANSESOPHAGEAL ECHOCARDIOGRAM (TEE);  Surgeon: Buford Dresser, MD;  Location: [facility] ENDOSCOPY;  Service: Cardiovascular;  Laterality: N/A;  . THYROIDECTOMY  2006     Current Outpatient Medications  Medication Sig Dispense Refill   . acetaminophen (TYLENOL) 500 MG tablet Take 1,000 mg by mouth as needed for mild pain.    Marland Kitchen apixaban (ELIQUIS) 5 MG TABS tablet Take 1 tablet (5 mg total) by mouth 2 (two) times daily. 180 tablet 2  . atorvastatin (LIPITOR) 80 MG tablet Take 1 tablet (80 mg total) by mouth daily. 90 tablet 2  . CRANBERRY EXTRACT PO Take 1 capsule by mouth daily.    . diclofenac sodium (VOLTAREN) 1 % GEL Apply 4 g topically 4 (four) times daily. 100 g 2  . fexofenadine (ALLEGRA) 30 MG tablet Take 30 mg by mouth daily as needed (allergies).     Marland Kitchen FLUoxetine (PROZAC) 10 MG capsule Take 1 capsule (10 mg total) by mouth daily. 90 capsule 1  . gabapentin (NEURONTIN) 300 MG capsule Take 1 capsule (300 mg total) by mouth at bedtime. 90 capsule 3  . Insulin Human (INSULIN PUMP) SOLN Inject into the skin continuous. humalog insulin    . levothyroxine (SYNTHROID, LEVOTHROID) 175 MCG tablet Take 175 mcg by mouth daily before breakfast. Every day but Sunday  12  . losartan (COZAAR) 25 MG tablet Take 1 tablet (25 mg total) by mouth at bedtime. 90 tablet 3  . metoprolol succinate (TOPROL-XL) 100 MG 24 hr tablet Take 1 tablet by mouth daily 90 tablet 1  . pantoprazole (PROTONIX) 40 MG tablet Take 1 tablet (40 mg total) by mouth daily. 90 tablet 0  . potassium chloride (K-DUR) 10 MEQ tablet Take 1 tablet by mouth on days toresmide is taken 30 tablet 2  . torsemide (DEMADEX) 10 MG tablet TAKE 1 TABLET BY MOUTH DAILY AS NEEDED FOR FLUID (Patient taking differently: Take 10 mg by mouth daily as needed (fluid). ) 90 tablet 0  . loperamide (IMODIUM A-D) 2 MG tablet Take 1 tablet (2 mg total) by mouth 4 (four) times daily as needed for diarrhea or loose stools. (Patient not taking: Reported on 04/29/2019) 30 tablet 0  . ondansetron (ZOFRAN ODT) 4 MG disintegrating tablet Take 1 tablet (4 mg total) by mouth every 8 (eight) hours as needed for nausea or vomiting. (Patient not taking: Reported on 04/29/2019) 20 tablet 0   No current  facility-administered medications for this encounter.     Allergies:   Latex   Social History:  The patient  reports that she has never smoked. She has never used smokeless tobacco. She reports that she does not drink alcohol or use drugs.   Family History:  The patient's  family history includes Diabetes in her maternal aunt; Early death in her father; Heart attack in her maternal grandfather and mother; Heart disease in her maternal aunt, maternal grandfather, and mother; Heart failure in her maternal grandfather; Hyperlipidemia in her father and mother; Hypertension in her father and mother; Prostate cancer in her paternal uncle; Renal Disease in her mother; Stroke in her son.    ROS:  Please see the history of present illness.   All other systems are personally reviewed and negative.   Exam: Well appearing, alert and conversant, regular work of breathing,  good skin color  Recent Labs: 11/25/2018: TSH 2.479 03/14/2019: BNP 617.9 04/15/2019: ALT 19;  Magnesium 1.5 04/18/2019: BUN 20; Creatinine, Ser 1.06; Hemoglobin 10.2; Platelets 229; Potassium 3.0; Sodium 140  personally reviewed    Other studies personally reviewed: Additional studies/ records that were reviewed today include: Epic notes    ASSESSMENT AND PLAN:  1.  Paroxysmal atrial fibrillation Increased burden about one month ago, likely 2/2 other medical issues. Continue Eliquis 5 mg BID Continue Toprol 100 mg daily Lifestyle modifications as below. Will check Bmet/CBC.  This patients CHA2DS2-VASc Score and unadjusted Ischemic Stroke Rate (% per year) is equal to 7.2 % stroke rate/year from a score of 5  Above score calculated as 1 point each if present [CHF, HTN, DM, Vascular=MI/PAD/Aortic Plaque, Age if 65-74, or Female] Above score calculated as 2 points each if present [Age > 75, or Stroke/TIA/TE]  2. Obesity Lifestyle modification was discussed and encouraged including regular physical activity and weight  reduction.  3. Snoring Sleep study pending 05/26/19.  4. HTN Stable, no changes today.  5. HFpEF No symptoms of fluid overload. Continue present therapy.    COVID screen The patient does not have any symptoms that suggest any further testing/ screening at this time.  Social distancing reinforced today.    Follow-up with AF clinic in 3 months. Dr Marlou Porch as scheduled.  Current medicines are reviewed at length with the patient today.   The patient does not have concerns regarding her medicines.  The following changes were made today:  none  Labs/ tests ordered today include: Bmet/CBC No orders of the defined types were placed in this encounter.   Patient Risk:  after full review of this patients clinical status, I feel that they are at moderate risk at this time.   Today, I have spent 17 minutes with the patient with telehealth technology discussing atrial fibrillation, medications, and COVID-19 precautions.    Gwenlyn Perking PA-C 05/20/2019 10:55 AM  [facility] [place], [place] [address] [phone]  Addendum: Labs 05/21/19 CBC: RBC 3.69, Hgb 10.3, Plt 244 Bmet: Cr 0.80, BUN 11, K+ 5.2, Na 142, Glu 226   I hereby voluntarily request, consent and authorize the Atrial Fibrillation Clinic and its employed or contracted physicians, physician assistants, nurse practitioners or other licensed health care professionals (the Practitioner), to provide me with telemedicine health care services (the "Services") as deemed necessary by the treating Practitioner. I acknowledge and consent to receive the Services by the Practitioner via telemedicine. I understand that the telemedicine visit will involve communicating with the Practitioner through live audiovisual communication technology and the disclosure of certain medical information by electronic transmission. I acknowledge that I have been given the opportunity to request an in-person assessment  or other available alternative prior to the telemedicine visit and am voluntarily participating in the telemedicine visit.   I understand that I have the right to withhold or withdraw my consent to the use of telemedicine in the course of my care at any time, without affecting my right to future care or treatment, and that the Practitioner or I may terminate the telemedicine visit at any time. I understand that I have the right to inspect all information obtained and/or recorded in the course of the telemedicine visit and may receive copies of available information for a reasonable fee.  I understand that some of the potential risks of receiving the Services via telemedicine include:   Delay or interruption in medical evaluation due to technological equipment failure or disruption;  Information transmitted may not be sufficient (e.g.  poor resolution of images) to allow for appropriate medical decision making by the Practitioner; and/or  In rare instances, security protocols could fail, causing a breach of personal health information.   Furthermore, I acknowledge that it is my responsibility to provide information about my medical history, conditions and care that is complete and accurate to the best of my ability. I acknowledge that Practitioner's advice, recommendations, and/or decision may be based on factors not within their control, such as incomplete or inaccurate data provided by me or distortions of diagnostic images or specimens that may result from electronic transmissions. I understand that the practice of medicine is not an exact science and that Practitioner makes no warranties or guarantees regarding treatment outcomes. I acknowledge that I will receive a copy of this consent concurrently upon execution via email to the email address I last provided but may also request a printed copy by calling the office of the [facility].  I understand that my insurance will be billed for  this visit.   I have read or had this consent read to me.  I understand the contents of this consent, which adequately explains the benefits and risks of the Services being provided via telemedicine.  I have been provided ample opportunity to ask questions regarding this consent and the Services and have had my questions answered to my satisfaction.  I give my informed consent for the services to be provided through the use of telemedicine in my medical care  By participating in this telemedicine visit I agree to the above.

## 2019-05-21 ENCOUNTER — Other Ambulatory Visit (HOSPITAL_COMMUNITY): Payer: Self-pay | Admitting: *Deleted

## 2019-05-21 ENCOUNTER — Other Ambulatory Visit (HOSPITAL_COMMUNITY): Payer: Self-pay | Admitting: Physician Assistant

## 2019-05-21 DIAGNOSIS — I480 Paroxysmal atrial fibrillation: Secondary | ICD-10-CM

## 2019-05-22 LAB — CBC/DIFF AMBIGUOUS DEFAULT
Basophils Absolute: 0 10*3/uL (ref 0.0–0.2)
Basos: 1 %
EOS (ABSOLUTE): 0 10*3/uL (ref 0.0–0.4)
Eos: 1 %
Hematocrit: 33.6 % — ABNORMAL LOW (ref 34.0–46.6)
Hemoglobin: 10.3 g/dL — ABNORMAL LOW (ref 11.1–15.9)
Immature Grans (Abs): 0 10*3/uL (ref 0.0–0.1)
Immature Granulocytes: 1 %
Lymphocytes Absolute: 0.9 10*3/uL (ref 0.7–3.1)
Lymphs: 20 %
MCH: 27.9 pg (ref 26.6–33.0)
MCHC: 30.7 g/dL — ABNORMAL LOW (ref 31.5–35.7)
MCV: 91 fL (ref 79–97)
Monocytes Absolute: 0.5 10*3/uL (ref 0.1–0.9)
Monocytes: 11 %
Neutrophils Absolute: 2.9 10*3/uL (ref 1.4–7.0)
Neutrophils: 66 %
Platelets: 244 10*3/uL (ref 150–450)
RBC: 3.69 x10E6/uL — ABNORMAL LOW (ref 3.77–5.28)
RDW: 14.3 % (ref 11.7–15.4)
WBC: 4.3 10*3/uL (ref 3.4–10.8)

## 2019-05-22 LAB — BASIC METABOLIC PANEL
BUN/Creatinine Ratio: 14 (ref 12–28)
BUN: 11 mg/dL (ref 8–27)
CO2: 23 mmol/L (ref 20–29)
Calcium: 9.1 mg/dL (ref 8.7–10.3)
Chloride: 104 mmol/L (ref 96–106)
Creatinine, Ser: 0.8 mg/dL (ref 0.57–1.00)
GFR calc Af Amer: 88 mL/min/{1.73_m2} (ref >59)
GFR calc non Af Amer: 76 mL/min/{1.73_m2} (ref >59)
Glucose: 226 mg/dL — ABNORMAL HIGH (ref 65–99)
Potassium: 5.2 mmol/L (ref 3.5–5.2)
Sodium: 142 mmol/L (ref 134–144)

## 2019-05-22 LAB — SPECIMEN STATUS REPORT

## 2019-05-22 NOTE — Addendum Note (Signed)
Encounter addended by: Oliver Barre, PA on: 05/22/2019 2:42 PM  Actions taken: Clinical Note Signed

## 2019-05-26 ENCOUNTER — Ambulatory Visit (INDEPENDENT_AMBULATORY_CARE_PROVIDER_SITE_OTHER): Payer: Medicare Other | Admitting: Neurology

## 2019-05-26 DIAGNOSIS — Z8673 Personal history of transient ischemic attack (TIA), and cerebral infarction without residual deficits: Secondary | ICD-10-CM

## 2019-05-26 DIAGNOSIS — G4733 Obstructive sleep apnea (adult) (pediatric): Secondary | ICD-10-CM

## 2019-05-26 DIAGNOSIS — G4719 Other hypersomnia: Secondary | ICD-10-CM

## 2019-05-26 DIAGNOSIS — I4819 Other persistent atrial fibrillation: Secondary | ICD-10-CM

## 2019-05-26 DIAGNOSIS — Z6841 Body Mass Index (BMI) 40.0 and over, adult: Secondary | ICD-10-CM

## 2019-05-26 DIAGNOSIS — R9431 Abnormal electrocardiogram [ECG] [EKG]: Secondary | ICD-10-CM

## 2019-05-26 DIAGNOSIS — R351 Nocturia: Secondary | ICD-10-CM

## 2019-05-26 DIAGNOSIS — G4720 Circadian rhythm sleep disorder, unspecified type: Secondary | ICD-10-CM

## 2019-05-26 DIAGNOSIS — R0683 Snoring: Secondary | ICD-10-CM

## 2019-05-27 ENCOUNTER — Encounter: Payer: Self-pay | Admitting: Family Medicine

## 2019-05-27 ENCOUNTER — Telehealth: Payer: Self-pay

## 2019-05-27 DIAGNOSIS — I4891 Unspecified atrial fibrillation: Secondary | ICD-10-CM | POA: Insufficient documentation

## 2019-05-27 DIAGNOSIS — G4733 Obstructive sleep apnea (adult) (pediatric): Secondary | ICD-10-CM | POA: Insufficient documentation

## 2019-05-27 DIAGNOSIS — I480 Paroxysmal atrial fibrillation: Secondary | ICD-10-CM | POA: Insufficient documentation

## 2019-05-27 NOTE — Addendum Note (Signed)
Addended by: Star Age on: 05/27/2019 08:33 AM   Modules accepted: Orders

## 2019-05-27 NOTE — Progress Notes (Signed)
Patient referred by Gertie Gowda., seen by me on 03/06/19, diagn. PSG on 05/26/19:    Please call and notify the patient that the recent sleep study showed obstructive sleep apnea in the severe range. While I recommend treatment for this in the form CPAP, we are not yet bringing patients in for in-lab testing for CPAP titration studies, due to the virus pandemic; therefore, I suggest we start her on a trial of autoPAP at home, which means, that we don't have to bring her back in (quite yet) for a sleep study with CPAP, but will let her start using an autoPAP machine at home, through a DME company (of patient's choice, or as per insurance requirement, as per in [employer], if there are such restrictions, depending on insurance carrier). The DME representative will educate the patient on how to use the machine, how to put the mask on, etc. I have placed an order in the chart. Please send referral, talk to patient, send report to referring MD. We will need a FU in sleep clinic for 10 weeks post-PAP set up, please arrange that with me or one of our NPs.  Also, please remind patient about the importance of compliance with PAP usage; this is an Designer, industrial/product, but good compliance also helps [place] track improvements in patient's sleep related complaints and objective improvements, such as BP and weight for example or nocturia or headaches, etc. For concerns and questions about how to clean the PAP machine and the supplies and how frequently to change the hose, mask and filters, etc., patient can call the DME company, for more information, education and troubleshooting. Especially in the current situation, I recommend, patients be extra mindful about hand hygiene, handling the PAP equipment only with clean hands, wipe the mask daily, keep little one and four-legged companions (and any other pets for that matter) away from the machine and mask at all times.    Thanks,   Star Age, MD, PhD [facility])

## 2019-05-27 NOTE — Procedures (Signed)
ventPATIENT'S NAME:  [name], [name] DOB:      Sep 15, 1951      MR#:    [id number]     DATE OF RECORDING: 05/26/2019 REFERRING M.D.:  Dan Humphreys, NP Study Performed:   Baseline Polysomnogram HISTORY: 68 year old woman with a history of hyperlipidemia, hypertension, type 2 diabetes, paroxysmal A. fib, stroke, CHF, depression, anemia, arthritis, allergies, thyroid cancer with status post thyroidectomy and radioiodine therapy, and morbid obesity with a BMI of over 40, who reports snoring and excessive daytime somnolence. The patient endorsed the Epworth Sleepiness Scale at 15/24 points. The patient's weight 263 pounds with a height of 67 (inches), resulting in a BMI of 41.2 kg/m2. The patient's neck circumference measured 16.5 inches.  CURRENT MEDICATIONS: Tylenol, Eliquis, Lipitor, Voltarem, Allegra, Prozac, Neurontin, Synthroid, Cozaar, Toprol XL, Protonix.   PROCEDURE:  This is a multichannel digital polysomnogram utilizing the Somnostar 11.2 system.  Electrodes and sensors were applied and monitored per AASM Specifications.   EEG, EOG, Chin and Limb EMG, were sampled at 200 Hz.  ECG, Snore and Nasal Pressure, Thermal Airflow, Respiratory Effort, CPAP Flow and Pressure, Oximetry was sampled at 50 Hz. Digital video and audio were recorded.      BASELINE STUDY  Lights Out was at 21:37 and Lights On at 04:59.  Total recording time (TRT) was 442 minutes, with a total sleep time (TST) of 397 minutes.   The patient's sleep latency was 13.5 minutes.  REM latency was 272 minutes, which is markedly delayed. The sleep efficiency was 89.8 %.     SLEEP ARCHITECTURE: WASO (Wake after sleep onset) was 42.5 minutes.  There were 4 minutes in Stage N1, 289.5 minutes Stage N2, 82.5 minutes Stage N3 and 21 minutes in Stage REM.  The percentage of Stage N1 was 1.%, Stage N2 was 72.9%, which is increased, Stage N3 was 20.8%, which is normal, and Stage R (REM sleep) was 5.3%, which is markedly reduced. The  arousals were noted as: 37 were spontaneous, 0 were associated with PLMs, 63 were associated with respiratory events.  RESPIRATORY ANALYSIS:  There were a total of 519 respiratory events:  74 obstructive apneas, 141 central apneas and 1 mixed apneas with a total of 216 apneas and an apnea index (AI) of 32.6 /hour. There were 303 hypopneas with a hypopnea index of 45.8 /hour. The patient also had 0 respiratory event related arousals (RERAs).      The total APNEA/HYPOPNEA INDEX (AHI) was 78.4 /hour and the total RESPIRATORY DISTURBANCE INDEX was 0. 78.4 /hour.  34 events occurred in REM sleep and 693 events in NREM. The REM AHI was 97.1 /hour, versus a non-REM AHI of 77.4. The patient spent 397 minutes of total sleep time in the supine position and 0 minutes in non-supine.. The supine AHI was 78.4 versus a non-supine AHI of 0.0.  OXYGEN SATURATION & C02:  The Wake baseline 02 saturation was 91%, with the lowest being 65%. Time spent below 89% saturation equaled 385 minutes.  PERIODIC LIMB MOVEMENTS: The patient had a total of 0 Periodic Limb Movements.  The Periodic Limb Movement (PLM) index was 0 and the PLM Arousal index was 0/hour.  Audio and video analysis did not show any abnormal or unusual movements, behaviors, phonations or vocalizations. The patient took 1 bathroom break. Moderate snoring was noted. The EKG was in keeping with normal sinus rhythm (NSR).  Post-study, the patient indicated that sleep was the same as usual.   IMPRESSION:  1. Obstructive Sleep  Apnea (OSA) 2. Dysfunctions associated with sleep stages or arousal from sleep 3. Non-specific abnormal EKG  RECOMMENDATIONS:  1. This study demonstrates severe obstructive sleep apnea, with a total AHI of 78.4/hour, REM AHI of 97.1/hour, supine AHI of 78.4/hour and O2 nadir of 65%. Treatment with positive airway pressure in the form of CPAP is recommended. This will require, ideally, a full night CPAP titration study for proper  treatment settings, O2 monitoring and mask fitting. Based on the severity of the sleep disordered breathing, an attended titration study is indicated. However, under the current circumstances (i.e. the COVID-19 pandemic), in order to ensure continuity of care and for the safety of the patient and healthcare professionals, she will be advised to proceed with an autoPAP titration/trial at home. A proper overnight, lab-attended PAP titration study with CPAP may be helpful or needed down the road to optimize treatment, when considered safe. An overnight pulse oximetry test may help with monitoring oxygen level after patient is established on autoPAP therapy at home.  2. Other treatment options for OSA, generally speaking, may include avoidance of supine sleep position along with weight loss, upper airway or jaw surgery in selected patients or the use of an oral appliance in certain patients. ENT evaluation and/or consultation with a maxillofacial surgeon or dentist may be feasible in some instances.    3. Please note, that untreated obstructive sleep apnea may carry additional perioperative morbidity. Patients with significant obstructive sleep apnea should receive perioperative PAP therapy and the surgeons and particularly the anesthesiologist should be informed of the diagnosis and the severity of the sleep disordered breathing. Patient will be reminded regarding compliance with the PAP machine and to be mindful of cleanliness with the equipment and timely with supply changes (i.e. changing filter, mask, hose, humidifier chamber on an ongoing basis, as recommended, and cleaning parts that touch the face and nose daily, etc).  4. The study showed PVCs and evidence of atrial fibrillation on single lead EKG; clinical correlation is recommended and consultation with cardiology may be feasible.  5. The patient should be cautioned not to drive, work at heights, or operate dangerous or heavy equipment when tired or  sleepy. Review and reiteration of good sleep hygiene measures should be pursued with any patient. 6. Other causes of the patient's symptoms, including circadian rhythm disturbances, an underlying mood disorder, medication effect and/or an underlying medical problem cannot be ruled out based on this test. Clinical correlation is recommended.  7. The patient and her referring provider will be notified of the test results. The patient will be seen in follow up in sleep clinic at [facility], either for a face-to-face or virtual visit, whichever feasible and recommended at the time.  I certify that I have reviewed the entire raw data recording prior to the issuance of this report in accordance with the Standards of Accreditation of the American Academy of Sleep Medicine (AASM)   Star Age, MD, PhD Diplomat, American Board of Neurology and Sleep Medicine (Neurology and Sleep Medicine)

## 2019-05-27 NOTE — Telephone Encounter (Signed)
I called pt. I advised pt that Dr. Rexene Alberts reviewed their sleep study results and found that pt has severe osa. Dr. Rexene Alberts recommends that pt start an auto pap. I reviewed PAP compliance expectations with the pt. Pt is agreeable to starting an auto-PAP. I advised pt that an order will be sent to a DME, [employer], and [name] will call the pt within about one week after they file with the pt's insurance. [name] will show the pt how to use the machine, fit for masks, and troubleshoot the auto-PAP if needed. A follow up appt was made for insurance purposes with Janett Billow, NP on 08/20/19 at 9:45am. Pt verbalized understanding to arrive 15 minutes early and bring their auto-PAP. A letter with all of this information in it will be mailed to the pt as a reminder. I verified with the pt that the address we have on file is correct. Pt verbalized understanding of results. Pt had no questions at this time but was encouraged to call back if questions arise. I have sent the order to [employer] and have received confirmation that they have received the order.

## 2019-05-27 NOTE — Telephone Encounter (Signed)
-----   Message from Star Age, MD sent at 05/27/2019  8:33 AM EDT ----- Patient referred by Gertie Gowda., seen by me on 03/06/19, diagn. PSG on 05/26/19:    Please call and notify the patient that the recent sleep study showed obstructive sleep apnea in the severe range. While I recommend treatment for this in the form CPAP, we are not yet bringing patients in for in-lab testing for CPAP titration studies, due to the virus pandemic; therefore, I suggest we start her on a trial of autoPAP at home, which means, that we don't have to bring her back in (quite yet) for a sleep study with CPAP, but will let her start using an autoPAP machine at home, through a DME company (of patient's choice, or as per insurance requirement, as per in [employer], if there are such restrictions, depending on insurance carrier). The DME representative will educate the patient on how to use the machine, how to put the mask on, etc. I have placed an order in the chart. Please send referral, talk to patient, send report to referring MD. We will need a FU in sleep clinic for 10 weeks post-PAP set up, please arrange that with me or one of our NPs.  Also, please remind patient about the importance of compliance with PAP usage; this is an Designer, industrial/product, but good compliance also helps [place] track improvements in patient's sleep related complaints and objective improvements, such as BP and weight for example or nocturia or headaches, etc. For concerns and questions about how to clean the PAP machine and the supplies and how frequently to change the hose, mask and filters, etc., patient can call the DME company, for more information, education and troubleshooting. Especially in the current situation, I recommend, patients be extra mindful about hand hygiene, handling the PAP equipment only with clean hands, wipe the mask daily, keep little one and four-legged companions (and any other pets for that matter) away from the machine and mask at  all times.     Thanks,   Star Age, MD, PhD [facility])

## 2019-05-31 ENCOUNTER — Encounter: Payer: Self-pay | Admitting: Family Medicine

## 2019-06-02 ENCOUNTER — Other Ambulatory Visit: Payer: Self-pay

## 2019-06-02 MED ORDER — ATORVASTATIN CALCIUM 80 MG PO TABS: 80.0000 mg | ORAL_TABLET | Freq: Every day | ORAL | 2 refills | Status: DC

## 2019-06-09 ENCOUNTER — Ambulatory Visit (INDEPENDENT_AMBULATORY_CARE_PROVIDER_SITE_OTHER): Payer: Medicare Other | Admitting: *Deleted

## 2019-06-09 DIAGNOSIS — I639 Cerebral infarction, unspecified: Secondary | ICD-10-CM | POA: Diagnosis not present

## 2019-06-09 LAB — CUP PACEART REMOTE DEVICE CHECK
Date Time Interrogation Session: 20200612161209
Implantable Pulse Generator Implant Date: 20191230

## 2019-06-17 NOTE — Progress Notes (Signed)
[facility] Summary Report / Loop Recorder 

## 2019-07-06 ENCOUNTER — Emergency Department (HOSPITAL_COMMUNITY)
Admission: EM | Admit: 2019-07-06 | Discharge: 2019-07-06 | Disposition: A | Discharge status: Home / Self Care | Payer: Medicare Other | Attending: Emergency Medicine | Admitting: Emergency Medicine

## 2019-07-06 ENCOUNTER — Other Ambulatory Visit: Payer: Self-pay

## 2019-07-06 ENCOUNTER — Emergency Department (HOSPITAL_COMMUNITY): Payer: Medicare Other

## 2019-07-06 ENCOUNTER — Encounter (HOSPITAL_COMMUNITY): Payer: Self-pay | Admitting: Emergency Medicine

## 2019-07-06 DIAGNOSIS — I110 Hypertensive heart disease with heart failure: Secondary | ICD-10-CM | POA: Insufficient documentation

## 2019-07-06 DIAGNOSIS — S42255A Nondisplaced fracture of greater tuberosity of left humerus, initial encounter for closed fracture: Secondary | ICD-10-CM | POA: Diagnosis not present

## 2019-07-06 DIAGNOSIS — Y999 Unspecified external cause status: Secondary | ICD-10-CM | POA: Diagnosis not present

## 2019-07-06 DIAGNOSIS — W1830XA Fall on same level, unspecified, initial encounter: Secondary | ICD-10-CM | POA: Insufficient documentation

## 2019-07-06 DIAGNOSIS — E669 Obesity, unspecified: Secondary | ICD-10-CM | POA: Insufficient documentation

## 2019-07-06 DIAGNOSIS — Z85850 Personal history of malignant neoplasm of thyroid: Secondary | ICD-10-CM | POA: Insufficient documentation

## 2019-07-06 DIAGNOSIS — E039 Hypothyroidism, unspecified: Secondary | ICD-10-CM | POA: Diagnosis not present

## 2019-07-06 DIAGNOSIS — S42202A Unspecified fracture of upper end of left humerus, initial encounter for closed fracture: Secondary | ICD-10-CM

## 2019-07-06 DIAGNOSIS — R609 Edema, unspecified: Secondary | ICD-10-CM | POA: Diagnosis not present

## 2019-07-06 DIAGNOSIS — Z7901 Long term (current) use of anticoagulants: Secondary | ICD-10-CM | POA: Insufficient documentation

## 2019-07-06 DIAGNOSIS — Y9301 Activity, walking, marching and hiking: Secondary | ICD-10-CM | POA: Diagnosis not present

## 2019-07-06 DIAGNOSIS — I509 Heart failure, unspecified: Secondary | ICD-10-CM | POA: Insufficient documentation

## 2019-07-06 DIAGNOSIS — S81002A Unspecified open wound, left knee, initial encounter: Secondary | ICD-10-CM | POA: Insufficient documentation

## 2019-07-06 DIAGNOSIS — Z6837 Body mass index (BMI) 37.0-37.9, adult: Secondary | ICD-10-CM | POA: Insufficient documentation

## 2019-07-06 DIAGNOSIS — W19XXXA Unspecified fall, initial encounter: Secondary | ICD-10-CM

## 2019-07-06 DIAGNOSIS — S4992XA Unspecified injury of left shoulder and upper arm, initial encounter: Secondary | ICD-10-CM | POA: Diagnosis present

## 2019-07-06 DIAGNOSIS — Y92019 Unspecified place in single-family (private) house as the place of occurrence of the external cause: Secondary | ICD-10-CM | POA: Diagnosis not present

## 2019-07-06 DIAGNOSIS — E109 Type 1 diabetes mellitus without complications: Secondary | ICD-10-CM | POA: Diagnosis not present

## 2019-07-06 DIAGNOSIS — Z79899 Other long term (current) drug therapy: Secondary | ICD-10-CM | POA: Diagnosis not present

## 2019-07-06 MED ORDER — HYDROCODONE-ACETAMINOPHEN 5-325 MG PO TABS
1.0000 | ORAL_TABLET | Freq: Once | ORAL | Status: AC
  Administered 2019-07-06: 1 via ORAL
  Filled 2019-07-06: qty 1

## 2019-07-06 MED ORDER — HYDROCODONE-ACETAMINOPHEN 5-325 MG PO TABS: 2.0000 | ORAL_TABLET | ORAL | 0 refills | Status: DC | PRN

## 2019-07-06 NOTE — ED Provider Notes (Signed)
Medical screening examination/treatment/procedure(s) were conducted as a shared visit with non-physician practitioner(s) and myself.  I personally evaluated the patient during the encounter.    Patient tripped on a ramp at home.  She fell on her left side.  She has pain in her left shoulder.  No other injury associated with the incident.  Patient is alert and appropriate.  Mental status clear.  No respiratory distress.  Pain and deformity at the left shoulder.  Patient does have help at home but also has significant mobility issues pre-existing.  Would like to get consult to try to get some home health care for daytime needs when her sister is at work and patient is alone.  Patient is stable for discharge aside from some additional needs of mobility and ADLs.  Will consult case work.   Charlesetta Shanks, MD 07/06/19 1051

## 2019-07-06 NOTE — ED Notes (Signed)
[name], Case Manager, at bedside reviewing home health information with pt.

## 2019-07-06 NOTE — Discharge Instructions (Addendum)
Please call Dr. Gilberto Better office tomorrow for a follow up appointment Take Norco (pain medicine) as prescribed for moderate-severe pain You can take extra strength Tylenol twice a day for extra pain relief

## 2019-07-06 NOTE — ED Triage Notes (Signed)
Pt presents with [facility] with c/o mechanical fall with L shoulder pain and minor skin tears. Denies LOC, injury to head. pt is on blood thinners for afib.

## 2019-07-06 NOTE — ED Provider Notes (Signed)
[facility] DEPARTMENT Provider Note   CSN: [id number] Arrival date & time: 07/06/19  3267     History   Chief Complaint Chief Complaint  Patient presents with  . Fall  . Shoulder Pain    HPI [name] is a 68 y.o. female who presents with a fall and shoulder pain. PMH significant for Type 1 DM, hx of thyroid cancer, HTN, HLD, CHF, GERD, hx of CVA with residual left-sided weakness, A.fib on Eliquis.  Patient states that she was going to go sit on her porch when she tripped and fell onto the ramp going to the porch.  She fell onto her left side.  She was unable to get up on her own and family members had to help her.  She reports acute onset of left shoulder and arm pain.  EMS was called and transported her to the ED.  Patient denies headache, head injury, neck pain, back pain, chest pain, shortness breath, abdominal pain, hip, knee, ankle or foot pain.  She has not taken anything for pain.  There was no loss of consciousness or dizziness prior to the incident.     HPI  Past Medical History:  Diagnosis Date  . Abnormal echocardiogram   . Anemia   . Arthritis   . CHF (congestive heart failure) ([place])   . Depression   . GERD (gastroesophageal reflux disease)   . Hemorrhoids   . Hyperlipidemia   . Hypertension   . Hypothyroidism   . Seasonal allergies   . Stroke ([place])   . Thyroid cancer ([place])    per pt S/p Total Thyroidectomy with Radioactive Iodine Therapy  . Type 1 diabetes mellitus [facility])     Patient Active Problem List   Diagnosis Date Noted  . OSA (obstructive sleep apnea) 05/27/2019  . Atrial fibrillation ([place]) 05/27/2019  . Insulin pump in place   . Ileus ([place])   . (HFpEF) heart failure with preserved ejection fraction ([place]) 03/17/2019  . Hot flashes 12/11/2018  . 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 Stroke (cerebrum) ([place]) 10/13/2018  . Frequent PVCs   . Uncontrolled type 1 diabetes mellitus ([place])   . BPPV  (benign paroxysmal positional vertigo), right 06/13/2017  . Premature atrial beats 06/13/2017  . Pre-ulcerative corn or callous 12/15/2015  . History of CHF (congestive heart failure) 10/29/2015  . Essential hypertension 10/29/2015  . Cerebrovascular accident (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 ([place]) 10/29/2015  . Intracranial vascular stenosis   . HLD (hyperlipidemia)   . CVA (cerebral vascular accident) ([place]) 08/29/2015  . Morbid obesity ([place]) 05/29/2013  . Back pain without radiation 04/28/2013  . 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 ([place]) 12/45/8099  . Hyperlipidemia 01/25/2012  . Hypothyroidism 01/25/2012  . GERD (gastroesophageal reflux disease) 01/25/2012    Past Surgical History:  Procedure Laterality Date  . ABDOMINAL ANGIOGRAM N/A 05/14/2012   Procedure: ABDOMINAL ANGIOGRAM;  Surgeon: Laverda Page, MD;  Location: [facility] CATH LAB;  Service: Cardiovascular;  Laterality: N/A;  . ABDOMINAL HYSTERECTOMY  1995   partial  . ANKLE FRACTURE SURGERY Right   . CARDIAC CATHETERIZATION     with coronary angiogram  . COLONOSCOPY N/A 09/09/2014   Procedure: COLONOSCOPY;  Surgeon: Gatha Mayer, MD;  Location: [facility];  Service: Endoscopy;  Laterality: N/A;  . ESOPHAGOGASTRODUODENOSCOPY (EGD) WITH PROPOFOL N/A 12/03/2018   Procedure: ESOPHAGOGASTRODUODENOSCOPY (EGD) WITH PROPOFOL;  Surgeon: Doran Stabler, MD;  Location: [facility];  Service: Gastroenterology;  Laterality: N/A;  . LEFT AND RIGHT HEART CATHETERIZATION WITH CORONARY ANGIOGRAM N/A 05/14/2012   Procedure: LEFT AND RIGHT HEART CATHETERIZATION WITH CORONARY ANGIOGRAM;  Surgeon: Laverda Page, MD;  Location: [facility] CATH LAB;  Service: Cardiovascular;  Laterality: N/A;  . LOOP RECORDER INSERTION N/A 12/23/2018   Procedure: LOOP RECORDER INSERTION;  Surgeon: Constance Haw, MD;  Location: [facility] CV LAB;  Service: Cardiovascular;  Laterality: N/A;  . MINI METER   09/08/2014   ELECTRONIC  INSULIN PUMP  . TEE WITHOUT CARDIOVERSION N/A 10/16/2018   Procedure: TRANSESOPHAGEAL ECHOCARDIOGRAM (TEE);  Surgeon: Buford Dresser, MD;  Location: [facility] ENDOSCOPY;  Service: Cardiovascular;  Laterality: N/A;  . THYROIDECTOMY  2006     OB History   No obstetric history on file.      Home Medications    Prior to Admission medications   Medication Sig Start Date End Date Taking? Authorizing Provider  acetaminophen (TYLENOL) 500 MG tablet Take 1,000 mg by mouth as needed for mild pain.    [provider]  apixaban (ELIQUIS) 5 MG TABS tablet Take 1 tablet (5 mg total) by mouth 2 (two) times daily. 03/20/19   Fenton, Clint R, PA  atorvastatin (LIPITOR) 80 MG tablet Take 1 tablet (80 mg total) by mouth daily. 06/02/19   Shirley, [name], DO  CRANBERRY EXTRACT PO Take 1 capsule by mouth daily.    [provider]  diclofenac sodium (VOLTAREN) 1 % GEL Apply 4 g topically 4 (four) times daily. 11/11/18   Shirley, [name], DO  fexofenadine (ALLEGRA) 30 MG tablet Take 30 mg by mouth daily as needed (allergies).     [provider]  FLUoxetine (PROZAC) 10 MG capsule Take 1 capsule (10 mg total) by mouth daily. 01/27/19   Shirley, [name], DO  gabapentin (NEURONTIN) 300 MG capsule Take 1 capsule (300 mg total) by mouth at bedtime. 01/08/19   Shirley, [name], DO  Insulin Human (INSULIN PUMP) SOLN Inject into the skin continuous. humalog insulin    [provider]  levothyroxine (SYNTHROID, LEVOTHROID) 175 MCG tablet Take 175 mcg by mouth daily before breakfast. Every day but Sunday 06/06/17   [provider]  loperamide (IMODIUM A-D) 2 MG tablet Take 1 tablet (2 mg total) by mouth 4 (four) times daily as needed for diarrhea or loose stools. Patient not taking: Reported on 04/29/2019 04/14/19   Bufford Lope, DO  losartan (COZAAR) 25 MG tablet Take 1 tablet (25 mg total) by mouth at bedtime. 04/29/19   Shirley, [name], DO  metoprolol succinate (TOPROL-XL) 100 MG 24 hr  tablet Take 1 tablet by mouth daily 03/17/19   Shirley, [name], DO  ondansetron (ZOFRAN ODT) 4 MG disintegrating tablet Take 1 tablet (4 mg total) by mouth every 8 (eight) hours as needed for nausea or vomiting. Patient not taking: Reported on 04/29/2019 04/14/19   Bufford Lope, DO  pantoprazole (PROTONIX) 40 MG tablet Take 1 tablet (40 mg total) by mouth daily. 12/09/18   Shirley, [name], DO  potassium chloride (K-DUR) 10 MEQ tablet Take 1 tablet by mouth on days toresmide is taken 03/20/19   Fenton, Clint R, PA  torsemide (DEMADEX) 10 MG tablet TAKE 1 TABLET BY MOUTH DAILY AS NEEDED FOR FLUID Patient taking differently: Take 10 mg by mouth daily as needed (fluid).  03/14/19   Shirley, [name], DO    Family History Family History  Problem Relation Age of Onset  . Heart disease Mother   . Hyperlipidemia Mother   .  Hypertension Mother   . Renal Disease Mother        insufficiency  . Heart attack Mother        in his 40's  . Early death Father        Head Injury at Work  . Hyperlipidemia Father   . Hypertension Father   . Stroke Son   . Diabetes Maternal Aunt   . Prostate cancer Paternal Uncle   . Heart disease Maternal Aunt   . Heart failure Maternal Grandfather   . Heart disease Maternal Grandfather   . Heart attack Maternal Grandfather        Died in his 38's  . Breast cancer Neg Hx     Social History Social History   Tobacco Use  . Smoking status: Never Smoker  . Smokeless tobacco: Never Used  Substance Use Topics  . Alcohol use: No  . Drug use: No     Allergies   Latex   Review of Systems Review of Systems  Respiratory: Negative for shortness of breath.   Cardiovascular: Negative for chest pain.  Gastrointestinal: Negative for abdominal pain.  Musculoskeletal: Positive for arthralgias and myalgias. Negative for back pain and neck pain.  Skin: Positive for wound.  Neurological: Negative for syncope and headaches.  All other systems reviewed and are negative.     Physical Exam Updated Vital Signs BP (!) 161/73 (BP Location: Right Arm)   Pulse 63   Temp 98.2 F (36.8 C) (Oral)   Resp (!) 23   Ht 5\' 7"  (1.702 m)   Wt 108.9 kg   SpO2 97%   BMI 37.59 kg/m   Physical Exam Vitals signs and nursing note reviewed.  Constitutional:      General: She is not in acute distress.    Appearance: She is well-developed. She is obese. She is not ill-appearing.  HENT:     Head: Normocephalic and atraumatic.  Eyes:     General: No scleral icterus.       Right eye: No discharge.        Left eye: No discharge.     Conjunctiva/sclera: Conjunctivae normal.     Pupils: Pupils are equal, round, and reactive to light.  Neck:     Musculoskeletal: Normal range of motion.  Cardiovascular:     Rate and Rhythm: Normal rate and regular rhythm.  Pulmonary:     Effort: Pulmonary effort is normal. No respiratory distress.     Breath sounds: Normal breath sounds.  Chest:     Chest wall: No tenderness.  Abdominal:     General: There is no distension.     Palpations: Abdomen is soft.     Tenderness: There is no abdominal tenderness.  Musculoskeletal:     Comments: Left upper extremity: No obvious swelling, deformity, or warmth. There are skin tears over the posterior elbow.  Mild, diffuse tenderness to palpation of the shoulder as well as tenderness of the distal humerus. FROM deferred. Able to wiggle fingers. N/V intact.  Left knee: Skin tears over the anterior knee. Decreased ROM of the knee. Bilateral leg edema   Skin:    General: Skin is warm and dry.  Neurological:     Mental Status: She is alert and oriented to person, place, and time.  Psychiatric:        Behavior: Behavior normal.      ED Treatments / Results  Labs (all labs ordered are listed, but only abnormal results are displayed) Labs Reviewed -  No data to display  EKG None  Radiology Dg Shoulder Left  Result Date: 07/06/2019 CLINICAL DATA:  Pain following fall EXAM: LEFT SHOULDER - 2+  VIEW COMPARISON:  None. FINDINGS: Oblique and Y scapular images were obtained. There is a comminuted fracture of the proximal humeral metaphysis. There is avulsion of the greater tuberosity. There is impaction at the fracture site with the humeral shaft displaced medially with respect to the more proximal humerus. No dislocation. There is mild generalized joint space narrowing. IMPRESSION: Comminuted fracture of the proximal humerus with impaction of the humeral shaft into the humeral head region. There is avulsion of the greater tuberosity. No dislocation. Mild underlying osteoarthritic change. Electronically Signed   By: Lowella Grip III M.D.   On: 07/06/2019 10:25   Dg Knee Complete 4 Views Left  Result Date: 07/06/2019 CLINICAL DATA:  Pain following fall. EXAM: LEFT KNEE - COMPLETE 4+ VIEW COMPARISON:  None. FINDINGS: Frontal, lateral, and bilateral oblique views were obtained. There is no evident fracture or dislocation. No joint effusion. There is moderate joint space narrowing medially with milder narrowing laterally and in the patellofemoral joint. There is spurring, most notably medially. No erosive change. There is slight popliteal artery atherosclerosis. IMPRESSION: Osteoarthritic change, primarily medial in location. No fracture or dislocation. No evident joint effusion. Mild popliteal artery atherosclerosis noted. Electronically Signed   By: Lowella Grip III M.D.   On: 07/06/2019 10:27   Dg Humerus Left  Result Date: 07/06/2019 CLINICAL DATA:  Pain following fall EXAM: LEFT HUMERUS - 2+ VIEW COMPARISON:  None. FINDINGS: Frontal and lateral views obtained. There is a comminuted fracture at the level of the proximal humeral metaphysis with impaction at the fracture site. There is medial displacement humeral shaft with respect to the humeral head. There is avulsion of the greater tuberosity. More distally, no fracture or dislocation evident. No elbow joint effusion. Mild narrowing in the  shoulder joint noted. IMPRESSION: Comminuted proximal humeral fracture with avulsion of the greater tuberosity and impaction of the humeral shaft into the humeral head region. No fracture or distally. No dislocation. Mild osteoarthritic change in the shoulder joint noted. Electronically Signed   By: Lowella Grip III M.D.   On: 07/06/2019 10:26    Procedures Procedures (including critical care time)  Medications Ordered in ED Medications  HYDROcodone-acetaminophen (NORCO/VICODIN) 5-325 MG per tablet 1 tablet (1 tablet Oral Given 07/06/19 0935)     Initial Impression / Assessment and Plan / ED Course  I have reviewed the triage vital signs and the nursing notes.  Pertinent labs & imaging results that were available during my care of the patient were reviewed by me and considered in my medical decision making (see chart for details).  68 year old female presents after mechanical fall and subsequent left shoulder and arm pain.  She is hypertensive but otherwise vital signs are normal.  Exam is remarkable for guarding of the left shoulder and diffuse tenderness of the shoulder and arm.  She has no elbow, wrist, hand tenderness.  She also has abrasions over the left knee but denies any tenderness.  Will obtain x-rays of the shoulder, arm, left knee.  She denies head injury.  X-ray is remarkable for comminuted proximal humerus fracture.  I discussed the results with patient.  She has a walker, wheelchair, bedside commode at home.  She has supportive family but is alone during the day. Shared visit with Dr. Colvin Caroli. Will order Home Health. She was given a referral to f/u with  Dr. Veverly Fells with [facility]  Final Clinical Impressions(s) / ED Diagnoses   Final diagnoses:  Fall, initial encounter  Closed fracture of proximal end of left humerus, unspecified fracture morphology, initial encounter    ED Discharge Orders    None       Recardo Evangelist, PA-C 07/06/19 1104    Charlesetta Shanks, MD 07/16/19 1230

## 2019-07-07 ENCOUNTER — Telehealth: Payer: Self-pay | Admitting: Family Medicine

## 2019-07-07 DIAGNOSIS — S42292A Other displaced fracture of upper end of left humerus, initial encounter for closed fracture: Secondary | ICD-10-CM

## 2019-07-07 NOTE — Care Management (Signed)
ED CM received consult concerning [facility] services recommendation.  Patient is agreeable to the services, discussed CMS comparable  [place] patient states she has had [place] in the past with [facility].  Referral faxed in to [facility] via [facility] information placed on AVS.  Patient made aware that she will  Received a call from RN to set up initial visit.

## 2019-07-07 NOTE — Telephone Encounter (Signed)
Pt called requesting a referral to orthopedic stated that she broke her arm.

## 2019-07-07 NOTE — Telephone Encounter (Signed)
Will forward to MD to place referral.  Originally placed in ED but it needs to come from pcp.  Please place as stat.  Jazmin Hartsell,CMA

## 2019-07-08 ENCOUNTER — Ambulatory Visit (INDEPENDENT_AMBULATORY_CARE_PROVIDER_SITE_OTHER): Payer: Medicare Other | Admitting: Orthopaedic Surgery

## 2019-07-08 ENCOUNTER — Encounter: Payer: Self-pay | Admitting: Orthopaedic Surgery

## 2019-07-08 ENCOUNTER — Telehealth: Payer: Self-pay | Admitting: *Deleted

## 2019-07-08 ENCOUNTER — Other Ambulatory Visit: Payer: Self-pay

## 2019-07-08 DIAGNOSIS — S42292A Other displaced fracture of upper end of left humerus, initial encounter for closed fracture: Secondary | ICD-10-CM | POA: Diagnosis not present

## 2019-07-08 DIAGNOSIS — S42202A Unspecified fracture of upper end of left humerus, initial encounter for closed fracture: Secondary | ICD-10-CM

## 2019-07-08 HISTORY — DX: Unspecified fracture of upper end of left humerus, initial encounter for closed fracture: S42.202A

## 2019-07-08 MED ORDER — OXYCODONE-ACETAMINOPHEN 5-325 MG PO TABS: 1.0000 | ORAL_TABLET | Freq: Three times a day (TID) | ORAL | 0 refills | Status: DC | PRN

## 2019-07-08 NOTE — Telephone Encounter (Signed)
EDCM spoke with pt 7/13 regarding home health services.  Pt states she had been active with [facility] in the past.  [facility] contacted rep for [facility]formally [facility]) to find that agency can not accept referral due to capacity.  Pt chose [facility] after being declined by [facility].  Danny Lawless accepted referral and will contact pt for start of services.

## 2019-07-08 NOTE — Telephone Encounter (Signed)
Called patient to check in on her after fall. She is being evaluated for surgery tomorrow by Dr. Marlou Sa. She may benefit from SNF placement afterwards per ortho's note. Patient appreciative of call. Will follow along in her chart with her care.   [name] Tkeyah Burkman, DO PGY-3, [facility]

## 2019-07-08 NOTE — Telephone Encounter (Signed)
Referral placed.

## 2019-07-08 NOTE — Addendum Note (Signed)
Addended by: Josephyne Tarter, [name] J on: 07/08/2019 11:10 AM   Modules accepted: Orders

## 2019-07-08 NOTE — Progress Notes (Signed)
Office Visit Note   Patient: [name]           Date of Birth: 1951-01-13           MRN: [id number] Visit Date: 07/08/2019              Requested by: Shirley, [name], [address],  [place] [id number] PCP: Shirley, [name], DO   Assessment & Plan: Visit Diagnoses:  1. Other closed displaced fracture of proximal end of left humerus, initial encounter     Plan: Impression is comminuted left proximal humerus fracture.  Bone quality on x-rays look poor.  Given displaced and comminuted nature patient may be a better candidate for reverse shoulder replacement rather than ORIF.  We will arrange for her to see Dr. Marlou Sa for consideration of reverse shoulder replacement.  Prescription for Percocet sent into the pharmacy today.  Given her lack of social support she may be appropriate for SNF postoperatively.  Follow-Up Instructions: No follow-ups on file.   Orders:  No orders of the defined types were placed in this encounter.  Meds ordered this encounter  Medications  . oxyCODONE-acetaminophen (PERCOCET) 5-325 MG tablet    Sig: Take 1-2 tablets by mouth every 8 (eight) hours as needed for severe [name].    Dispense:  30 tablet    Refill:  0      Procedures: No procedures performed   Clinical Data: No additional findings.   Subjective: Chief Complaint  Patient presents with  . Left Shoulder - Injury    [name] is a 68 year old female who had a mechanical fall onto her left shoulder 2 days ago and sustained a comminuted displaced left proximal humerus fracture.  She is right-hand dominant.  She has a history of atrial fibrillation, CVA, congestive heart failure, type 1 diabetes.  She lives at home with her sister and niece but they work during the day.  She endorses severe [name] in her left shoulder.  She has had trouble walking since her stroke last year.  She fell when she was using a Rollator.   Review of Systems  Constitutional: Negative.    HENT: Negative.   Eyes: Negative.   Respiratory: Negative.   Cardiovascular: Negative.   Endocrine: Negative.   Musculoskeletal: Negative.   Neurological: Negative.   Hematological: Negative.   Psychiatric/Behavioral: Negative.   All other systems reviewed and are negative.    Objective: Vital Signs: There were no vitals taken for this visit.  Physical Exam Vitals signs and nursing note reviewed.  Constitutional:      Appearance: She is well-developed.  HENT:     Head: Normocephalic and atraumatic.  Neck:     Musculoskeletal: Neck supple.  Pulmonary:     Effort: Pulmonary effort is normal.  Abdominal:     Palpations: Abdomen is soft.  Skin:    General: Skin is warm.     Capillary Refill: Capillary refill takes less than 2 seconds.  Neurological:     Mental Status: She is alert and oriented to person, place, and time.  Psychiatric:        Behavior: Behavior normal.        Thought Content: Thought content normal.        Judgment: Judgment normal.     Ortho Exam Left shoulder exam shows mild swelling.  No neurovascular compromise distally. Specialty Comments:  No specialty comments available.  Imaging: No results found.   PMFS History: Patient Active Problem List  Diagnosis Date Noted  . Closed fracture of left proximal humerus 07/08/2019  . OSA (obstructive sleep apnea) 05/27/2019  . Atrial fibrillation ([place]) 05/27/2019  . Insulin pump in place   . Ileus ([place])   . (HFpEF) heart failure with preserved ejection fraction ([place]) 03/17/2019  . Hot flashes 12/11/2018  . 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 Stroke (cerebrum) ([place]) 10/13/2018  . Frequent PVCs   . Uncontrolled type 1 diabetes mellitus ([place])   . BPPV (benign paroxysmal positional vertigo), right 06/13/2017  . Premature atrial beats 06/13/2017  . Pre-ulcerative corn or callous 12/15/2015  . History of CHF (congestive heart failure) 10/29/2015  . Essential hypertension 10/29/2015  .  Cerebrovascular accident (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 ([place]) 10/29/2015  . Intracranial vascular stenosis   . HLD (hyperlipidemia)   . CVA (cerebral vascular accident) ([place]) 08/29/2015  . Morbid obesity ([place]) 05/29/2013  . Back [name] without radiation 04/28/2013  . 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 ([place]) 54/08/8118  . Hyperlipidemia 01/25/2012  . Hypothyroidism 01/25/2012  . GERD (gastroesophageal reflux disease) 01/25/2012   Past Medical History:  Diagnosis Date  . Abnormal echocardiogram   . Anemia   . Arthritis   . CHF (congestive heart failure) ([place])   . Depression   . GERD (gastroesophageal reflux disease)   . Hemorrhoids   . Hyperlipidemia   . Hypertension   . Hypothyroidism   . Seasonal allergies   . Stroke ([place])   . Thyroid cancer ([place])    per pt S/p Total Thyroidectomy with Radioactive Iodine Therapy  . Type 1 diabetes mellitus ([facility])     Family History  Problem Relation Age of Onset  . Heart disease Mother   . Hyperlipidemia Mother   . Hypertension Mother   . Renal Disease Mother        insufficiency  . Heart attack Mother        in his 69's  . Early death Father        Head Injury at Work  . Hyperlipidemia Father   . Hypertension Father   . Stroke Son   . Diabetes Maternal Aunt   . Prostate cancer Paternal Uncle   . Heart disease Maternal Aunt   . Heart failure Maternal Grandfather   . Heart disease Maternal Grandfather   . Heart attack Maternal Grandfather        Died in his 11's  . Breast cancer Neg Hx     Past Surgical History:  Procedure Laterality Date  . ABDOMINAL ANGIOGRAM N/A 05/14/2012   Procedure: ABDOMINAL ANGIOGRAM;  Surgeon: Laverda Page, MD;  Location: [facility] CATH LAB;  Service: Cardiovascular;  Laterality: N/A;  . ABDOMINAL HYSTERECTOMY  1995   partial  . ANKLE FRACTURE SURGERY Right   . CARDIAC CATHETERIZATION     with coronary angiogram  . COLONOSCOPY N/A 09/09/2014   Procedure: COLONOSCOPY;   Surgeon: Gatha Mayer, MD;  Location: [facility];  Service: Endoscopy;  Laterality: N/A;  . ESOPHAGOGASTRODUODENOSCOPY (EGD) WITH PROPOFOL N/A 12/03/2018   Procedure: ESOPHAGOGASTRODUODENOSCOPY (EGD) WITH PROPOFOL;  Surgeon: Doran Stabler, MD;  Location: [facility];  Service: Gastroenterology;  Laterality: N/A;  . LEFT AND RIGHT HEART CATHETERIZATION WITH CORONARY ANGIOGRAM N/A 05/14/2012   Procedure: LEFT AND RIGHT HEART CATHETERIZATION WITH CORONARY ANGIOGRAM;  Surgeon: Laverda Page, MD;  Location: [facility] CATH LAB;  Service: Cardiovascular;  Laterality: N/A;  . LOOP RECORDER INSERTION N/A 12/23/2018   Procedure: LOOP RECORDER INSERTION;  Surgeon: Constance Haw, MD;  Location: [facility] CV LAB;  Service: Cardiovascular;  Laterality: N/A;  . MINI METER   09/08/2014   ELECTRONIC INSULIN PUMP  . TEE WITHOUT CARDIOVERSION N/A 10/16/2018   Procedure: TRANSESOPHAGEAL ECHOCARDIOGRAM (TEE);  Surgeon: Buford Dresser, MD;  Location: [facility];  Service: Cardiovascular;  Laterality: N/A;  . THYROIDECTOMY  2006   Social History   Occupational History  . Occupation: retired Pharmacist, hospital  Tobacco Use  . Smoking status: Never Smoker  . Smokeless tobacco: Never Used  Substance and Sexual Activity  . Alcohol use: No  . Drug use: No  . Sexual activity: Not on file

## 2019-07-09 ENCOUNTER — Ambulatory Visit (INDEPENDENT_AMBULATORY_CARE_PROVIDER_SITE_OTHER): Payer: Medicare Other | Admitting: Orthopedic Surgery

## 2019-07-09 ENCOUNTER — Encounter: Payer: Self-pay | Admitting: Orthopedic Surgery

## 2019-07-09 ENCOUNTER — Telehealth: Payer: Self-pay

## 2019-07-09 ENCOUNTER — Ambulatory Visit (INDEPENDENT_AMBULATORY_CARE_PROVIDER_SITE_OTHER): Payer: Medicare Other | Admitting: *Deleted

## 2019-07-09 DIAGNOSIS — I639 Cerebral infarction, unspecified: Secondary | ICD-10-CM | POA: Diagnosis not present

## 2019-07-09 DIAGNOSIS — S42292A Other displaced fracture of upper end of left humerus, initial encounter for closed fracture: Secondary | ICD-10-CM

## 2019-07-09 LAB — CUP PACEART REMOTE DEVICE CHECK
Date Time Interrogation Session: 20200715174220
Implantable Pulse Generator Implant Date: 20191230

## 2019-07-09 NOTE — Telephone Encounter (Signed)
Betsy, PT with [facility], called nurse line requesting VO for continuation of PT:  2x a week for 4 weeks 1x a week for 2 weeks  OT evaluation.  All verbals can be called to [place]

## 2019-07-09 NOTE — Telephone Encounter (Signed)
Called in orders for PT and OT as requested. Thank you

## 2019-07-11 ENCOUNTER — Ambulatory Visit (HOSPITAL_COMMUNITY): Payer: Medicare Other

## 2019-07-11 ENCOUNTER — Encounter: Payer: Self-pay | Admitting: Orthopedic Surgery

## 2019-07-11 NOTE — Progress Notes (Signed)
Office Visit Note   Patient: [name]           Date of Birth: 10/04/1951           MRN: [id number] Visit Date: 07/09/2019 Requested by: Shirley, [name], [place],  [place] [address] PCP: Shirley, [name], DO  Subjective: No chief complaint on file.   HPI: Kalman Shan is a patient with left proximal humerus fracture sustained several days ago.  She does not have any help at home.  She had a stroke in late 2019 and is on Eliquis for that.  She has had 4 falls since her stroke.  She also is diabetic with hemoglobin A1c around 8.  She also has atrial fibrillation.  She states her left shoulder is extremely painful.  By radiograph she has a complex comminuted proximal humerus fracture.              ROS: All systems reviewed are negative as they relate to the chief complaint within the history of present illness.  Patient denies  fevers or chills.   Assessment & Plan: Visit Diagnoses:  1. Other closed displaced fracture of proximal end of left humerus, initial encounter     Plan: Impression is possible humerus fracture in a patient with high risk comorbidities including diabetes as well as history of stroke within the past 8 months.  She currently is on Eliquis.  Plan is thin cut CT scan for preop planning with possible reverse shoulder replacement to follow.  We would need to get cardiac wrist ratification to see if she could come off her Eliquis or if she would need a Lovenox bridge prior to the procedure.  I think this fracture could heal on its own but she would have fairly significant functional limitation.  We discussed today about the risk and benefits of surgery as well as potential for having another stroke or heart attack as a result of the stress of the surgery.  Patient understands and we will talk about the CT scan when she comes back.  I will call her with those results and we can decide about operative versus nonoperative therapy at that time based both  on her reported clinical symptoms and the amount of displacement on the CT scan.  Follow-Up Instructions: Return if symptoms worsen or fail to improve.   Orders:  Orders Placed This Encounter  Procedures   CT SHOULDER LEFT WO CONTRAST   No orders of the defined types were placed in this encounter.     Procedures: No procedures performed   Clinical Data: No additional findings.  Objective: Vital Signs: There were no vitals taken for this visit.  Physical Exam:   Constitutional: Patient appears well-developed HEENT:  Head: Normocephalic Eyes:EOM are normal Neck: Normal range of motion Cardiovascular: Normal rate Pulmonary/chest: Effort normal Neurologic: Patient is alert Skin: Skin is warm Psychiatric: Patient has normal mood and affect    Ortho Exam: Ortho exam demonstrates some swelling in the proximal humeral region.  Difficult to really assess her axillary nerve but I think it is functional.  Biceps and triceps is functional.  Radial pulses intact.  She does have a lot of pain with any type of motion.  Elbow wrist range of motion on the left intact.  She does have less grip strength on the left compared to the right but she does have functional EPL FPL interosseous function.  Specialty Comments:  No specialty comments available.  Imaging: No results found.  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/2018   Stroke (cerebrum) ([place]) 10/13/2018   Frequent PVCs    Uncontrolled type 1 diabetes mellitus (HCC)    BPPV (benign paroxysmal positional vertigo), right 06/13/2017   Premature atrial beats 06/13/2017    Pre-ulcerative corn or callous 12/15/2015   History of CHF (congestive heart failure) 10/29/2015   Essential hypertension 10/29/2015   Cerebrovascular accident (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 ([place]) 10/29/2015   Intracranial vascular stenosis    HLD (hyperlipidemia)    CVA (cerebral vascular accident) ([place]) 08/29/2015   Morbid obesity ([place]) 05/29/2013   Back pain without radiation 04/28/2013   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 ([place]) 70/96/2836   Hyperlipidemia 01/25/2012   Hypothyroidism 01/25/2012   GERD (gastroesophageal reflux disease) 01/25/2012   Past Medical History:  Diagnosis Date   Abnormal echocardiogram    Anemia    Arthritis    CHF (congestive heart failure) ([place])    Depression    GERD (gastroesophageal reflux disease)    Hemorrhoids    Hyperlipidemia    Hypertension    Hypothyroidism    Seasonal allergies    Stroke ([place])    Thyroid cancer ([place])    per pt S/p Total Thyroidectomy with Radioactive Iodine Therapy   Type 1 diabetes mellitus ([place])     Family History  Problem Relation Age of Onset   Heart disease Mother    Hyperlipidemia Mother    Hypertension Mother    Renal Disease Mother        insufficiency   Heart attack Mother        in his 43's   Early death Father        Head Injury at Work   Hyperlipidemia Father    Hypertension Father    Stroke Son    Diabetes Maternal Aunt    Prostate cancer Paternal Uncle    Heart disease Maternal Aunt    Heart failure Maternal Grandfather    Heart disease Maternal Grandfather    Heart attack Maternal Grandfather        Died in his 103's   Breast cancer Neg Hx     Past Surgical History:  Procedure Laterality Date   ABDOMINAL ANGIOGRAM N/A 05/14/2012   Procedure: ABDOMINAL ANGIOGRAM;  Surgeon: Laverda Page, MD;  [facility] C[facility] LAB;  Service: Cardiovascular;  Laterality: N/A;   ABDOMINAL HYSTERECTOMY  1995   partial   ANKLE  FRACTURE SURGERY Right    CARDIAC CATHETERIZATION     with coronary angiogram   COLONOSCOPY N/A 09/09/2014   Procedure: COLONOSCOPY;  Surgeon: Gatha Mayer, MD;  Location: [facility];  Service: Endoscopy;  Laterality: N/A;   ESOPHAGOGASTRODUODENOSCOPY (EGD) WITH PROPOFOL N/A 12/03/2018   Procedure: ESOPHAGOGASTRODUODENOSCOPY (EGD) WITH PROPOFOL;  Surgeon: Doran Stabler, MD;  Location: [facility];  Service: Gastroenterology;  Laterality: N/A;   LEFT AND RIGHT HEART CATHETERIZATION WITH CORONARY ANGIOGRAM N/A 05/14/2012   Procedure: LEFT AND RIGHT HEART CATHETERIZATION WITH CORONARY ANGIOGRAM;  Surgeon: Laverda Page, MD;  Location: [facility] CATH LAB;  Service: Cardiovascular;  Laterality: N/A;   LOOP RECORDER INSERTION  N/A 12/23/2018   Procedure: LOOP RECORDER INSERTION;  Surgeon: Constance Haw, MD;  Location: [facility] CV LAB;  Service: Cardiovascular;  Laterality: N/A;   MINI METER   09/08/2014   ELECTRONIC INSULIN PUMP   TEE WITHOUT CARDIOVERSION N/A 10/16/2018   Procedure: TRANSESOPHAGEAL ECHOCARDIOGRAM (TEE);  Surgeon: Buford Dresser, MD;  Location: [facility] ENDOSCOPY;  Service: Cardiovascular;  Laterality: N/A;   THYROIDECTOMY  2006   Social History   Occupational History   Occupation: retired Pharmacist, hospital  Tobacco Use   Smoking status: Never Smoker   Smokeless tobacco: Never Used  Substance and Sexual Activity   Alcohol use: No   Drug use: No   Sexual activity: Not on file

## 2019-07-14 ENCOUNTER — Ambulatory Visit (HOSPITAL_COMMUNITY)
Admission: RE | Admit: 2019-07-14 | Discharge: 2019-07-14 | Disposition: A | Discharge status: Home / Self Care | Payer: Medicare Other | Source: Ambulatory Visit | Attending: Orthopedic Surgery | Admitting: Orthopedic Surgery

## 2019-07-14 ENCOUNTER — Other Ambulatory Visit: Payer: Self-pay

## 2019-07-14 DIAGNOSIS — S42292A Other displaced fracture of upper end of left humerus, initial encounter for closed fracture: Secondary | ICD-10-CM | POA: Diagnosis not present

## 2019-07-16 ENCOUNTER — Telehealth: Payer: Self-pay | Admitting: Adult Health

## 2019-07-16 ENCOUNTER — Telehealth: Payer: Self-pay

## 2019-07-16 NOTE — Telephone Encounter (Signed)
Pt called in and wanting ti inform [name], that she purchased So Clean to clean her CPAP , she states every time she uses the CPAP she has diarrhea, she states she changed her mask to a front mask but she is gaining to much air, she states her mouth becomes try. She stated a week ago she broke her left arm from falling, she states she hasnt used machine since she broke her arm and she feels her appt needs to be canceled. She states she is unable to get around. She states at night she is afraid to use CPAP because she keeps getting a cold. She states she doesn't know if she should continue to use CPAP.

## 2019-07-16 NOTE — Telephone Encounter (Signed)
Called pt.  She has stopped using the cpap machine since 07-06-19 due to breaking L arm.  I pulled cpap download to look at ? Needing to change anything.  I relayed that did not think that diarrhea is issue with cpap.  Monitor if continues to see pcp for evaluation.  She questioned mouth dry at times.  She does have humidified air.  Please advise if any recomendations.  I instructed her to keep [facility] informed of what is going on as well.  I told her to keep appt as scheduled 08-20-19 and has gets closer she can always call and cancel if she felt like she could not make this (due to L arm, mobility etc).  She verbalized understanding. I spoke to [place], NP and she relayed pt needs to keep using cpap, AHI is down from sleep study results.  Diarrhea not related to cpap.

## 2019-07-16 NOTE — Telephone Encounter (Signed)
IC verbal given.  

## 2019-07-16 NOTE — Telephone Encounter (Signed)
Will, Occupational therapist with [facility] would like verbal orders for 2 x week for 4 weeks.  CB# is [phone].  Please advise.  Thank you.

## 2019-07-17 NOTE — Telephone Encounter (Addendum)
I spoke to pt and she has been in touch with [employer].  She stated they readjusted machine,  She did meet requirement for her insurance.    Then our phone disconnected.  I called back and she will wear it and call us back as needed.

## 2019-07-17 NOTE — Progress Notes (Signed)
I called and left message for patient

## 2019-07-18 ENCOUNTER — Telehealth: Payer: Self-pay

## 2019-07-18 ENCOUNTER — Telehealth: Payer: Self-pay | Admitting: Orthopedic Surgery

## 2019-07-18 NOTE — Telephone Encounter (Signed)
Patient called insisting that someone call her about her arm. Seen where patient had a CT Scan and asked if she wanted to schedule an appt to go over image. SHe stated she wanted to speak to Dr. Marlou Sa.  Please call patient @ [phone]

## 2019-07-18 NOTE — Telephone Encounter (Signed)
Patient called LM on triage phone stating that she had gotten a VM from Dr Marlou Sa and was asking for a return call to further discuss.

## 2019-07-18 NOTE — Telephone Encounter (Signed)
See also other note I sen you about this.

## 2019-07-19 NOTE — Progress Notes (Signed)
[facility] Summary Report / Loop Recorder 

## 2019-07-22 ENCOUNTER — Encounter: Payer: Self-pay | Admitting: Family Medicine

## 2019-07-22 MED ORDER — FLUOXETINE HCL 10 MG PO CAPS: 10.0000 mg | ORAL_CAPSULE | Freq: Every day | ORAL | 1 refills | Status: DC

## 2019-07-22 NOTE — Telephone Encounter (Signed)
I talked to this patient.  Discussed with her the CT scan and her multiple medical comorbidities.  In general the fracture is in somewhat reasonable alignment at t the time of the CT scan.  She is coming in tomorrow.  We will get a see how she looks on x-rays then.  For now she is sending a relative over to pick up a larger shoulder immobilizer because the one she has now is too small and is choking her.  Lauren can you leave a large shoulder immobilizer for her at the front desk thanks

## 2019-07-22 NOTE — Telephone Encounter (Signed)
Put up front

## 2019-07-22 NOTE — Telephone Encounter (Signed)
See prior message

## 2019-07-23 ENCOUNTER — Encounter: Payer: Self-pay | Admitting: Orthopedic Surgery

## 2019-07-23 ENCOUNTER — Ambulatory Visit: Payer: Self-pay

## 2019-07-23 ENCOUNTER — Other Ambulatory Visit: Payer: Self-pay

## 2019-07-23 ENCOUNTER — Ambulatory Visit (INDEPENDENT_AMBULATORY_CARE_PROVIDER_SITE_OTHER): Payer: Medicare Other | Admitting: Orthopedic Surgery

## 2019-07-23 DIAGNOSIS — S42292A Other displaced fracture of upper end of left humerus, initial encounter for closed fracture: Secondary | ICD-10-CM | POA: Diagnosis not present

## 2019-07-23 MED ORDER — FLUOXETINE HCL 20 MG PO TABS: 20.0000 mg | ORAL_TABLET | Freq: Every day | ORAL | 2 refills | Status: DC

## 2019-07-23 MED ORDER — OXYCODONE HCL 5 MG PO TABS: 5.0000 mg | ORAL_TABLET | Freq: Three times a day (TID) | ORAL | 0 refills | Status: DC | PRN

## 2019-07-23 NOTE — Progress Notes (Signed)
Post-Op Visit Note   Patient: [name]           Date of Birth: [birth year]           MRN: [id number] Visit Date: 07/23/2019 PCP: Shirley, [name], DO   Assessment & Plan:  Chief Complaint:  Chief Complaint  Patient presents with  . Left Shoulder - Follow-up   Visit Diagnoses:  1. Other closed displaced fracture of proximal end of left humerus, initial encounter     Plan: [name] presents for follow-up 2 weeks out left proximal humerus fracture.  On exam she is in a well fitting sling which we provided for her yesterday.  The sling before was a little bit too small.  She does have some resolving bruising ecchymosis.  Motor sensory function to the left hand is intact.  Radiographs today do show change in fracture alignment which would put her more in the reverse shoulder replacement category.  At this time we discussed this at length and because of her history of strokes x2 and history of diabetes she wants to hold off on surgery.  I did tell her that this is good to give her significant functional limitation below shoulder level.  She is okay with that because she does not want the risk of surgery.  I will see her back in 2 weeks for clinical recheck.  Fracture fragments are still mobile at this time.  If she does go on to nonunion that may force our hand but if the fracture fragment tonight she could have a minimally functional but relatively nonpainful shoulder.  Follow-Up Instructions: Return in about 2 weeks (around 08/06/2019).   Orders:  Orders Placed This Encounter  Procedures  . XR Shoulder Left   Meds ordered this encounter  Medications  . oxyCODONE (OXY IR/ROXICODONE) 5 MG immediate release tablet    Sig: Take 1 tablet (5 mg total) by mouth every 8 (eight) hours as needed for severe pain.    Dispense:  30 tablet    Refill:  0    Imaging: Xr Shoulder Left  Result Date: 07/23/2019 AP outlet left shoulder reviewed.  There is been increased movement of the  fracture fragment sites since prior CT scan and radiographs.  The humeral shaft is migrated proximally.  Tuberosities have also migrated to some degree.  Shoulder remains located.   PMFS History: Patient Active Problem List   Diagnosis Date Noted  . Closed fracture of left proximal humerus 07/08/2019  . OSA (obstructive sleep apnea) 05/27/2019  . Atrial fibrillation ([place]) 05/27/2019  . Insulin pump in place   . Ileus ([place])   . (HFpEF) heart failure with preserved ejection fraction ([place]) 03/17/2019  . Hot flashes 12/11/2018  . 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 Stroke (cerebrum) ([place]) 10/13/2018  . Frequent PVCs   . Uncontrolled type 1 diabetes mellitus ([place])   . BPPV (benign paroxysmal positional vertigo), right 06/13/2017  . Premature atrial beats 06/13/2017  . Pre-ulcerative corn or callous 12/15/2015  . History of CHF (congestive heart failure) 10/29/2015  . Essential hypertension 10/29/2015  . Cerebrovascular accident (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 ([place]) 10/29/2015  . Intracranial vascular stenosis   . HLD (hyperlipidemia)   . CVA (cerebral vascular accident) ([place]) 08/29/2015  . Morbid obesity ([place]) 05/29/2013  . Back pain without radiation 04/28/2013  . 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 Hyperlipidemia 01/25/2012  . Hypothyroidism 01/25/2012  . GERD (gastroesophageal reflux  disease) 01/25/2012   Past Medical History:  Diagnosis Date  . Abnormal echocardiogram   . Anemia   . Arthritis   . CHF (congestive heart failure) ([place])   . Depression   . GERD (gastroesophageal reflux disease)   . Hemorrhoids   . Hyperlipidemia   . Hypertension   . Hypothyroidism   . Seasonal allergies   . Stroke ([place])   . Thyroid cancer ([place])    per pt S/p Total Thyroidectomy with Radioactive Iodine Therapy  . Type 1 diabetes mellitus (HCC)     Family History  Problem Relation Age of Onset  . Heart disease Mother   .  Hyperlipidemia Mother   . Hypertension Mother   . Renal Disease Mother        insufficiency  . Heart attack Mother        in his 47's  . Early death Father        Head Injury at Work  . Hyperlipidemia Father   . Hypertension Father   . Stroke Son   . Diabetes Maternal Aunt   . Prostate cancer Paternal Uncle   . Heart disease Maternal Aunt   . Heart failure Maternal Grandfather   . Heart disease Maternal Grandfather   . Heart attack Maternal Grandfather        Died in his 54's  . Breast cancer Neg Hx     Past Surgical History:  Procedure Laterality Date  . ABDOMINAL ANGIOGRAM N/A 05/14/2012   Procedure: ABDOMINAL ANGIOGRAM;  Surgeon: Laverda Page, MD;  Location: [facility] CATH LAB;  Service: Cardiovascular;  Laterality: N/A;  . ABDOMINAL HYSTERECTOMY  1995   partial  . ANKLE FRACTURE SURGERY Right   . CARDIAC CATHETERIZATION     with coronary angiogram  . COLONOSCOPY N/A 09/09/2014   Procedure: COLONOSCOPY;  Surgeon: Gatha Mayer, MD;  Location: [facility];  Service: Endoscopy;  Laterality: N/A;  . ESOPHAGOGASTRODUODENOSCOPY (EGD) WITH PROPOFOL N/A 12/03/2018   Procedure: ESOPHAGOGASTRODUODENOSCOPY (EGD) WITH PROPOFOL;  Surgeon: Doran Stabler, MD;  Location: [facility];  Service: Gastroenterology;  Laterality: N/A;  . LEFT AND RIGHT HEART CATHETERIZATION WITH CORONARY ANGIOGRAM N/A 05/14/2012   Procedure: LEFT AND RIGHT HEART CATHETERIZATION WITH CORONARY ANGIOGRAM;  Surgeon: Laverda Page, MD;  Location: [facility] CATH LAB;  Service: Cardiovascular;  Laterality: N/A;  . LOOP RECORDER INSERTION N/A 12/23/2018   Procedure: LOOP RECORDER INSERTION;  Surgeon: Constance Haw, MD;  Location: [facility] CV LAB;  Service: Cardiovascular;  Laterality: N/A;  . MINI METER   09/08/2014   ELECTRONIC INSULIN PUMP  . TEE WITHOUT CARDIOVERSION N/A 10/16/2018   Procedure: TRANSESOPHAGEAL ECHOCARDIOGRAM (TEE);  Surgeon: Buford Dresser, MD;  Location: [facility];  Service:  Cardiovascular;  Laterality: N/A;  . THYROIDECTOMY  2006   Social History   Occupational History  . Occupation: retired Pharmacist, hospital  Tobacco Use  . Smoking status: Never Smoker  . Smokeless tobacco: Never Used  Substance and Sexual Activity  . Alcohol use: No  . Drug use: No  . Sexual activity: Not on file

## 2019-07-24 ENCOUNTER — Telehealth: Payer: Self-pay | Admitting: Orthopedic Surgery

## 2019-07-24 NOTE — Telephone Encounter (Signed)
Tried calling again. LMVM to [facility] to schedule appt

## 2019-07-24 NOTE — Telephone Encounter (Signed)
Patient called asked for a call back concerning scheduling an appointment with Dr Marlou Sa. Patient said the nurse was suppose to call her back. I offered to make the 2 week ROV. The number to contact patient is [phone]

## 2019-07-24 NOTE — Telephone Encounter (Signed)
Tried calling patient to schedule 2 week follow up appt No answer. LMVM for her to call back to schedule.

## 2019-07-24 NOTE — Telephone Encounter (Signed)
Can you please try calling her again tomorrow to get her scheduled to see Dr Marlou Sa sometime around 08/12 for a 2wk follow up.

## 2019-07-25 NOTE — Telephone Encounter (Signed)
I called patient and made follow up appt for 08/06/2019 at 0830.

## 2019-07-26 ENCOUNTER — Encounter: Payer: Self-pay | Admitting: Family Medicine

## 2019-07-28 ENCOUNTER — Encounter: Payer: Self-pay | Admitting: Family Medicine

## 2019-07-29 ENCOUNTER — Encounter: Payer: Self-pay | Admitting: Family Medicine

## 2019-07-30 ENCOUNTER — Telehealth: Payer: Self-pay | Admitting: Family Medicine

## 2019-07-30 ENCOUNTER — Encounter: Payer: Self-pay | Admitting: Family Medicine

## 2019-07-30 DIAGNOSIS — R197 Diarrhea, unspecified: Secondary | ICD-10-CM

## 2019-07-30 DIAGNOSIS — R112 Nausea with vomiting, unspecified: Secondary | ICD-10-CM

## 2019-07-30 MED ORDER — ONDANSETRON 4 MG PO TBDP: 4.0000 mg | ORAL_TABLET | Freq: Three times a day (TID) | ORAL | 0 refills | Status: DC | PRN

## 2019-07-30 MED ORDER — LOPERAMIDE HCL 2 MG PO TABS: 2.0000 mg | ORAL_TABLET | Freq: Four times a day (QID) | ORAL | 0 refills | Status: DC | PRN

## 2019-07-30 NOTE — Telephone Encounter (Signed)
Patient reports that she has been falling. She has fallen 6 times in the past few months. She has had family members who have moved away. She is seeing physical therapy and is really looking forward to seeing me for evaluation of her electric wheelchair.   Patient is also endorsing some continued nausea as well as diarrhea.  She has tried loperamide without success.  She does also report that some doctor has given her some stool softeners and she has been taking this.  She was advised to stop any stool softeners, fiber supplements or other things that might cause loose stools and gave her a refill of loperamide along with Zofran to use as needed for nausea.  [name] Melessia Kaus, DO PGY-3, [facility]

## 2019-07-31 ENCOUNTER — Encounter: Payer: Self-pay | Admitting: Family Medicine

## 2019-08-05 ENCOUNTER — Telehealth (INDEPENDENT_AMBULATORY_CARE_PROVIDER_SITE_OTHER): Payer: Medicare Other | Admitting: Family Medicine

## 2019-08-05 ENCOUNTER — Encounter: Payer: Self-pay | Admitting: Family Medicine

## 2019-08-05 ENCOUNTER — Other Ambulatory Visit: Payer: Self-pay | Admitting: Family Medicine

## 2019-08-05 ENCOUNTER — Other Ambulatory Visit: Payer: Self-pay

## 2019-08-05 DIAGNOSIS — Z993 Dependence on wheelchair: Secondary | ICD-10-CM | POA: Insufficient documentation

## 2019-08-05 DIAGNOSIS — S42292A Other displaced fracture of upper end of left humerus, initial encounter for closed fracture: Secondary | ICD-10-CM

## 2019-08-05 DIAGNOSIS — I639 Cerebral infarction, unspecified: Secondary | ICD-10-CM

## 2019-08-05 NOTE — Progress Notes (Addendum)
[place] Telemedicine Visit  Patient consented to have virtual visit. Method of visit: Telephone  Encounter participants: Patient: [name] - located at home Provider: [name] Devota Viruet - located at [facility]  Others (if applicable): n/a  Chief Complaint: needs wheelchair   HPI:  Patient requesting an electric wheelchair in order to ambulate around her house. The patient may have difficulty transferring on and off of the scooter. Patient is unable to to walk unassisted. She uses a cane now, but she is still having falls and has trouble walking after her last CVA on 09/2018. Patient tries to use a cane but is still having falls, therefore a cane would be inappropriate due to her continued falling. Patient reports that she has fallen eight times this year, and a motorized wheelchair may decrease her falls and allow more independence.  Patient could operate a motorized wheelchair.  Patient does not qualify for manual wheelchair given her recent fracture of her humerus that prevents her from full use of her left arm. Since she was unable to have surgical repair, she may never regain full strength of this arm back. Patient is also experiencing some LUE weakness after her stroke. Cognition is intact.   Patient believes she is able to operate motorized wheelchair in her home with ease and is able to perform some ADLs if she has this motorized wheelchair.  [name] is the company who may be able to supply her with her wheelchair or scooter.   ROS: per HPI  Pertinent PMHx: recent fx of L humerus, h/o CVA, T2DM, HTN, A fib  Exam:  Respiratory: able to speak in complete sentences without issue  During previous examinations patient has shown left grip strength weakness and left fine motor decrease.  Unable to evaluate current strength based on need for virtual medicine during COVID pandemic.  Assessment/Plan:  Dependent for wheelchair mobility Patient qualifies  for a power wheelchair in order to mobilize around her home for independence.  Patient with recent stroke in 09/2018 and multiple falls after stroke which is also called a fracture of her left humerus which is nonoperable allowing patient not to be able to use manual wheelchair.    Time spent during visit with patient: 9 minutes  [name] Siah Kannan, DO PGY-3, [facility]

## 2019-08-06 ENCOUNTER — Encounter: Payer: Self-pay | Admitting: Orthopedic Surgery

## 2019-08-06 ENCOUNTER — Ambulatory Visit (INDEPENDENT_AMBULATORY_CARE_PROVIDER_SITE_OTHER): Payer: Medicare Other | Admitting: Orthopedic Surgery

## 2019-08-06 ENCOUNTER — Other Ambulatory Visit: Payer: Self-pay

## 2019-08-06 DIAGNOSIS — S42292A Other displaced fracture of upper end of left humerus, initial encounter for closed fracture: Secondary | ICD-10-CM

## 2019-08-06 NOTE — Assessment & Plan Note (Addendum)
Patient qualifies for a power wheelchair in order to mobilize around her home for independence.  Patient with recent stroke in 09/2018 and multiple falls after stroke which is also called a fracture of her left humerus which is nonoperable allowing patient not to be able to use manual wheelchair.

## 2019-08-06 NOTE — Progress Notes (Signed)
Post-Op Visit Note   Patient: [name]           Date of Birth: [birth year]           MRN: [id number] Visit Date: 08/06/2019 PCP: Shirley, [name], DO   Assessment & Plan:  Chief Complaint:  Chief Complaint  Patient presents with  . Left Shoulder - Follow-up, Fracture   Visit Diagnoses: No diagnosis found.  Plan: Patient is a 68 year old female who presents for follow-up of left proximal humerus fracture sustained on 07/06/2019.  Patient's pain is improving.  Denies any numbness or tingling.  She is able to extend her wrist and fire her deltoid on exam.  Her fracture seems to be moving as a unit when passively moving her shoulder.  Patient will continue home health occupational therapy 2-3 times a week for 3 weeks to see how much function she can regain.  If she decides she wishes to pursue surgery and her medical doctor allows her to come off of her blood thinners for procedure, patient will call the office to discuss potential for reverse shoulder arthroplasty.  Patient agrees with the plan and will follow-up PRN.  Follow-Up Instructions: No follow-ups on file.   Orders:  No orders of the defined types were placed in this encounter.  No orders of the defined types were placed in this encounter.   Imaging: No results found.  PMFS History: Patient Active Problem List   Diagnosis Date Noted  . Dependent for wheelchair mobility 08/05/2019  . Closed fracture of left proximal humerus 07/08/2019  . OSA (obstructive sleep apnea) 05/27/2019  . Atrial fibrillation ([place]) 05/27/2019  . Insulin pump in place   . Ileus ([place])   . (HFpEF) heart failure with preserved ejection fraction ([place]) 03/17/2019  . Hot flashes 12/11/2018  . 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 Stroke (cerebrum) ([place]) 10/13/2018  . Frequent PVCs   . Uncontrolled type 1 diabetes mellitus ([place])   . BPPV (benign paroxysmal positional vertigo), right 06/13/2017  . Premature atrial beats  06/13/2017  . Pre-ulcerative corn or callous 12/15/2015  . History of CHF (congestive heart failure) 10/29/2015  . Essential hypertension 10/29/2015  . Cerebrovascular accident (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 ([place]) 10/29/2015  . Intracranial vascular stenosis   . HLD (hyperlipidemia)   . CVA (cerebral vascular accident) ([place]) 08/29/2015  . Morbid obesity ([place]) 05/29/2013  . Back pain without radiation 04/28/2013  . 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 Hyperlipidemia 01/25/2012  . Hypothyroidism 01/25/2012  . GERD (gastroesophageal reflux disease) 01/25/2012   Past Medical History:  Diagnosis Date  . Abnormal echocardiogram   . Anemia   . Arthritis   . CHF (congestive heart failure) ([place])   . Depression   . GERD (gastroesophageal reflux disease)   . Hemorrhoids   . Hyperlipidemia   . Hypertension   . Hypothyroidism   . Seasonal allergies   . Stroke ([place])   . Thyroid cancer ([place])    per pt S/p Total Thyroidectomy with Radioactive Iodine Therapy  . Type 1 diabetes mellitus ([facility])     Family History  Problem Relation Age of Onset  . Heart disease Mother   . Hyperlipidemia Mother   . Hypertension Mother   . Renal Disease Mother        insufficiency  . Heart attack Mother        in his 39's  . Early death Father  Head Injury at Work  . Hyperlipidemia Father   . Hypertension Father   . Stroke Son   . Diabetes Maternal Aunt   . Prostate cancer Paternal Uncle   . Heart disease Maternal Aunt   . Heart failure Maternal Grandfather   . Heart disease Maternal Grandfather   . Heart attack Maternal Grandfather        Died in his 78's  . Breast cancer Neg Hx     Past Surgical History:  Procedure Laterality Date  . ABDOMINAL ANGIOGRAM N/A 05/14/2012   Procedure: ABDOMINAL ANGIOGRAM;  Surgeon: Laverda Page, MD;  Location: [facility] CATH LAB;  Service: Cardiovascular;  Laterality: N/A;  . ABDOMINAL HYSTERECTOMY  1995    partial  . ANKLE FRACTURE SURGERY Right   . CARDIAC CATHETERIZATION     with coronary angiogram  . COLONOSCOPY N/A 09/09/2014   Procedure: COLONOSCOPY;  Surgeon: Gatha Mayer, MD;  Location: [facility];  Service: Endoscopy;  Laterality: N/A;  . ESOPHAGOGASTRODUODENOSCOPY (EGD) WITH PROPOFOL N/A 12/03/2018   Procedure: ESOPHAGOGASTRODUODENOSCOPY (EGD) WITH PROPOFOL;  Surgeon: Doran Stabler, MD;  Location: [facility];  Service: Gastroenterology;  Laterality: N/A;  . LEFT AND RIGHT HEART CATHETERIZATION WITH CORONARY ANGIOGRAM N/A 05/14/2012   Procedure: LEFT AND RIGHT HEART CATHETERIZATION WITH CORONARY ANGIOGRAM;  Surgeon: Laverda Page, MD;  Location: [facility] CATH LAB;  Service: Cardiovascular;  Laterality: N/A;  . LOOP RECORDER INSERTION N/A 12/23/2018   Procedure: LOOP RECORDER INSERTION;  Surgeon: Constance Haw, MD;  Location: [facility] CV LAB;  Service: Cardiovascular;  Laterality: N/A;  . MINI METER   09/08/2014   ELECTRONIC INSULIN PUMP  . TEE WITHOUT CARDIOVERSION N/A 10/16/2018   Procedure: TRANSESOPHAGEAL ECHOCARDIOGRAM (TEE);  Surgeon: Buford Dresser, MD;  Location: [facility];  Service: Cardiovascular;  Laterality: N/A;  . THYROIDECTOMY  2006   Social History   Occupational History  . Occupation: retired Pharmacist, hospital  Tobacco Use  . Smoking status: Never Smoker  . Smokeless tobacco: Never Used  Substance and Sexual Activity  . Alcohol use: No  . Drug use: No  . Sexual activity: Not on file

## 2019-08-08 ENCOUNTER — Encounter: Payer: Self-pay | Admitting: Orthopedic Surgery

## 2019-08-08 ENCOUNTER — Ambulatory Visit (HOSPITAL_COMMUNITY)
Admission: RE | Admit: 2019-08-08 | Discharge: 2019-08-08 | Disposition: A | Discharge status: Home / Self Care | Payer: Medicare Other | Source: Ambulatory Visit | Attending: Physician Assistant | Admitting: Physician Assistant

## 2019-08-08 ENCOUNTER — Other Ambulatory Visit: Payer: Self-pay

## 2019-08-11 ENCOUNTER — Ambulatory Visit (INDEPENDENT_AMBULATORY_CARE_PROVIDER_SITE_OTHER): Payer: Medicare Other | Admitting: *Deleted

## 2019-08-11 ENCOUNTER — Telehealth: Payer: Self-pay

## 2019-08-11 DIAGNOSIS — I639 Cerebral infarction, unspecified: Secondary | ICD-10-CM

## 2019-08-11 LAB — CUP PACEART REMOTE DEVICE CHECK
Date Time Interrogation Session: 20200817181255
Implantable Pulse Generator Implant Date: 20191230

## 2019-08-11 NOTE — Telephone Encounter (Signed)
Called and and gave verbal orders for patient to have home health PT.

## 2019-08-11 NOTE — Telephone Encounter (Signed)
Pt was unsure how to get in touch with Ricky in the [facility]  States she missed her "virtual visit with him on Friday".  States she couldn't get her phone to work for the visit and was unable to speak with him.  She would like to reschedule. Aware that I would forward this to [facility] and they would call her to address rescheduling visit. She is agreeable to plan.

## 2019-08-11 NOTE — Telephone Encounter (Signed)
Betsy, PT, called nurse line requesting verbal orders to extend home health PT.  2x a week for 3 weeks  [phone], you can leave Betsy a VM. [phone]

## 2019-08-12 ENCOUNTER — Ambulatory Visit (HOSPITAL_COMMUNITY)
Admission: RE | Admit: 2019-08-12 | Discharge: 2019-08-12 | Disposition: A | Discharge status: Home / Self Care | Payer: Medicare Other | Source: Ambulatory Visit | Attending: Physician Assistant | Admitting: Physician Assistant

## 2019-08-12 ENCOUNTER — Other Ambulatory Visit: Payer: Self-pay

## 2019-08-12 VITALS — BP 133/70 | HR 65

## 2019-08-12 DIAGNOSIS — I480 Paroxysmal atrial fibrillation: Secondary | ICD-10-CM

## 2019-08-12 NOTE — Progress Notes (Signed)
Electrophysiology TeleHealth Note   Due to national recommendations of social distancing due to [place] 19, Audio telehealth visit is felt to be most appropriate for this patient at this time.  See consent below from today for patient consent regarding telehealth for the Atrial Fibrillation Clinic. Consent obtained verbally.   Date:  08/12/2019   ID:  [name], DOB May 16, 1951, MRN [id number]  Location: home  Provider location: [address] [place], [place] [address] Evaluation Performed: Follow up  PCP:  Shirley, [name], DO  Primary Cardiologist:  Dr Marlou Porch Primary Electrophysiologist: Dr Curt Bears   CC: Follow up for atrial fibrillation   History of Present Illness: [name] is a 68 y.o. female who presents via audio conferencing for a telehealth visit today. Patient was hospitalized for DKA and ileus and her loop recorder did show increased AF burden around the time of her admission. She reports that she was unaware of her arrhythmia. She states that she has been doing very well since then with no SOB or edema.   On follow up today, patient has had a mechanical fall on 07/06/19 with a fracture of he humerus. Per ortho notes, no surgical intervention planned. Her AF burden dramatically increased around the time of her fall but has decreased again over the last several days. She had no awareness of her arrhythmia and her rates appear to be controlled. She was also diagnosed with OSA but has not been using the machine consistently because she states that she gets URI every time she uses it.  Today, she denies symptoms of palpitations, chest pain, orthopnea, PND, lower extremity edema, claudication, dizziness, presyncope, syncope, bleeding, or neurologic sequela. The patient is tolerating medications without difficulties and is otherwise without complaint today.    Atrial Fibrillation Risk Factors:  she does have symptoms or diagnosis of sleep apnea.  She is not compliant with CPAP therapy. she does not have a history of rheumatic fever. she does not have a history of alcohol use. The patient does not have a history of early familial atrial fibrillation or other arrhythmias.  she has a BMI of There is no height or weight on file to calculate BMI..  BP 133/70 Pulse 65 Provided by PT and OT home health.   Past Medical History:  Diagnosis Date  . Abnormal echocardiogram   . Anemia   . Arthritis   . CHF (congestive heart failure) ([place])   . Depression   . GERD (gastroesophageal reflux disease)   . Hemorrhoids   . Hyperlipidemia   . Hypertension   . Hypothyroidism   . Seasonal allergies   . Stroke ([place])   . Thyroid cancer ([place])    per pt S/p Total Thyroidectomy with Radioactive Iodine Therapy  . Type 1 diabetes mellitus ([place])    Past Surgical History:  Procedure Laterality Date  . ABDOMINAL ANGIOGRAM N/A 05/14/2012   Procedure: ABDOMINAL ANGIOGRAM;  Surgeon: Laverda Page, MD;  Location: [facility] CATH LAB;  Service: Cardiovascular;  Laterality: N/A;  . ABDOMINAL HYSTERECTOMY  1995   partial  . ANKLE FRACTURE SURGERY Right   . CARDIAC CATHETERIZATION     with coronary angiogram  . COLONOSCOPY N/A 09/09/2014   Procedure: COLONOSCOPY;  Surgeon: Gatha Mayer, MD;  Location: [facility];  Service: Endoscopy;  Laterality: N/A;  . ESOPHAGOGASTRODUODENOSCOPY (EGD) WITH PROPOFOL N/A 12/03/2018   Procedure: ESOPHAGOGASTRODUODENOSCOPY (EGD) WITH PROPOFOL;  Surgeon: Doran Stabler, MD;  Location: [facility];  Service:  Gastroenterology;  Laterality: N/A;  . LEFT AND RIGHT HEART CATHETERIZATION WITH CORONARY ANGIOGRAM N/A 05/14/2012   Procedure: LEFT AND RIGHT HEART CATHETERIZATION WITH CORONARY ANGIOGRAM;  Surgeon: Laverda Page, MD;  Location: [facility] CATH LAB;  Service: Cardiovascular;  Laterality: N/A;  . LOOP RECORDER INSERTION N/A 12/23/2018   Procedure: LOOP RECORDER INSERTION;  Surgeon: Constance Haw, MD;  Location: [place] CV LAB;  Service: Cardiovascular;  Laterality: N/A;  . MINI METER   09/08/2014   ELECTRONIC INSULIN PUMP  . TEE WITHOUT CARDIOVERSION N/A 10/16/2018   Procedure: TRANSESOPHAGEAL ECHOCARDIOGRAM (TEE);  Surgeon: Buford Dresser, MD;  Location: [facility] ENDOSCOPY;  Service: Cardiovascular;  Laterality: N/A;  . THYROIDECTOMY  2006     Current Outpatient Medications  Medication Sig Dispense Refill  . acetaminophen (TYLENOL) 500 MG tablet Take 1,000 mg by mouth as needed for mild pain.    Marland Kitchen apixaban (ELIQUIS) 5 MG TABS tablet Take 1 tablet (5 mg total) by mouth 2 (two) times daily. 180 tablet 2  . atorvastatin (LIPITOR) 80 MG tablet Take 1 tablet (80 mg total) by mouth daily. 90 tablet 2  . CRANBERRY EXTRACT PO Take 1 capsule by mouth daily.    . diclofenac sodium (VOLTAREN) 1 % GEL Apply 4 g topically 4 (four) times daily. 100 g 2  . fexofenadine (ALLEGRA) 30 MG tablet Take 30 mg by mouth daily as needed (allergies).     Marland Kitchen FLUoxetine (PROZAC) 10 MG capsule Take 1 capsule (10 mg total) by mouth daily. 90 capsule 1  . FLUoxetine (PROZAC) 20 MG tablet Take 1 tablet (20 mg total) by mouth daily. Take with 10mg  for 30mg  total. 90 tablet 2  . gabapentin (NEURONTIN) 300 MG capsule Take 1 capsule (300 mg total) by mouth at bedtime. 90 capsule 3  . HYDROcodone-acetaminophen (NORCO/VICODIN) 5-325 MG tablet Take 2 tablets by mouth every 4 (four) hours as needed. 20 tablet 0  . Insulin Human (INSULIN PUMP) SOLN Inject into the skin continuous. humalog insulin    . levothyroxine (SYNTHROID, LEVOTHROID) 175 MCG tablet Take 175 mcg by mouth daily before breakfast. Every day but Sunday  12  . loperamide (IMODIUM A-D) 2 MG tablet Take 1 tablet (2 mg total) by mouth 4 (four) times daily as needed for diarrhea or loose stools. 30 tablet 0  . losartan (COZAAR) 25 MG tablet Take 1 tablet (25 mg total) by mouth at bedtime. 90 tablet 3  . metoprolol succinate (TOPROL-XL) 100 MG 24 hr tablet TAKE 1 TABLET BY  MOUTH DAILY 90 tablet 1  . ondansetron (ZOFRAN ODT) 4 MG disintegrating tablet Take 1 tablet (4 mg total) by mouth every 8 (eight) hours as needed for nausea or vomiting. 20 tablet 0  . oxyCODONE (OXY IR/ROXICODONE) 5 MG immediate release tablet Take 1 tablet (5 mg total) by mouth every 8 (eight) hours as needed for severe pain. 30 tablet 0  . oxyCODONE-acetaminophen (PERCOCET) 5-325 MG tablet Take 1-2 tablets by mouth every 8 (eight) hours as needed for severe pain. 30 tablet 0  . pantoprazole (PROTONIX) 40 MG tablet Take 1 tablet (40 mg total) by mouth daily. 90 tablet 0  . potassium chloride (K-DUR) 10 MEQ tablet Take 1 tablet by mouth on days toresmide is taken 30 tablet 2  . torsemide (DEMADEX) 10 MG tablet TAKE 1 TABLET BY MOUTH DAILY AS NEEDED FOR FLUID (Patient taking differently: Take 10 mg by mouth daily as needed (fluid). ) 90 tablet 0   No current facility-administered  medications for this visit.     Allergies:   Latex   Social History:  The patient  reports that she has never smoked. She has never used smokeless tobacco. She reports that she does not drink alcohol or use drugs.   Family History:  The patient's  family history includes Diabetes in her maternal aunt; Early death in her father; Heart attack in her maternal grandfather and mother; Heart disease in her maternal aunt, maternal grandfather, and mother; Heart failure in her maternal grandfather; Hyperlipidemia in her father and mother; Hypertension in her father and mother; Prostate cancer in her paternal uncle; Renal Disease in her mother; Stroke in her son.    ROS:  Please see the history of present illness.   All other systems are personally reviewed and negative.   Recent Labs: 11/25/2018: TSH 2.479 03/14/2019: BNP 617.9 04/15/2019: ALT 19; Magnesium 1.5 05/21/2019: BUN 11; Creatinine, Ser 0.80; Hemoglobin 10.3; Platelets 244; Potassium 5.2; Sodium 142  personally reviewed    Other studies personally reviewed:  Additional studies/ records that were reviewed today include: Epic notes    ASSESSMENT AND PLAN:  1.  Paroxysmal atrial fibrillation Increased burden over the last month, 43% AF burden. ? Secondary to humerus fracture. Patient has no awareness and was rate controlled.  We discussed possibility of AAD and patient would like to hold on starting new medications given other medical issues and paucity of symptoms.  Continue Eliquis 5 mg BID Continue Toprol 100 mg daily Lifestyle modifications as below.  This patients CHA2DS2-VASc Score and unadjusted Ischemic Stroke Rate (% per year) is equal to 7.2 % stroke rate/year from a score of 5  Above score calculated as 1 point each if present [CHF, HTN, DM, Vascular=MI/PAD/Aortic Plaque, Age if 65-74, or Female] Above score calculated as 2 points each if present [Age > 75, or Stroke/TIA/TE]  2. Obesity Lifestyle modification was discussed and encouraged including regular physical activity and weight reduction.  3. OSA Newly diagnosed, severe per report. Encouraged patient to discuss with sleep medicine. Discussed importance of treating OSA for maintaining SR long term.  4. HTN Stable, no changes today.  5. HFpEF No symptoms of fluid overload. Continue present therapy.      Follow-up with AF clinic in 3 months. Dr Marlou Porch as scheduled.  Current medicines are reviewed at length with the patient today.   The patient does not have concerns regarding her medicines.  The following changes were made today:  none  Labs/ tests ordered today include:  No orders of the defined types were placed in this encounter.   Patient Risk:  after full review of this patients clinical status, I feel that they are at moderate risk at this time.   Today, I have spent 32 minutes with the patient with telehealth technology discussing the above.  Gwenlyn Perking PA-C 08/12/2019 9:46 AM  [facility] [place], [place] [address] [phone]   I hereby voluntarily request, consent and authorize the [facility] and its employed or contracted physicians, physician assistants, nurse practitioners or other licensed health care professionals (the Practitioner), to provide me with telemedicine health care services (the "Services") as deemed necessary by the treating Practitioner. I acknowledge and consent to receive the Services by the Practitioner via telemedicine. I understand that the telemedicine visit will involve communicating with the Practitioner through live audiovisual communication technology and the disclosure of certain medical information by electronic transmission. I acknowledge that I have  been given the opportunity to request an in-person assessment or other available alternative prior to the telemedicine visit and am voluntarily participating in the telemedicine visit.   I understand that I have the right to withhold or withdraw my consent to the use of telemedicine in the course of my care at any time, without affecting my right to future care or treatment, and that the Practitioner or I may terminate the telemedicine visit at any time. I understand that I have the right to inspect all information obtained and/or recorded in the course of the telemedicine visit and may receive copies of available information for a reasonable fee.  I understand that some of the potential risks of receiving the Services via telemedicine include:   Delay or interruption in medical evaluation due to technological equipment failure or disruption;  Information transmitted may not be sufficient (e.g. poor resolution of images) to allow for appropriate medical decision making by the Practitioner; and/or  In rare instances, security protocols could fail, causing a breach of personal health information.   Furthermore, I acknowledge that it is my responsibility to provide information about my medical  history, conditions and care that is complete and accurate to the best of my ability. I acknowledge that Practitioner's advice, recommendations, and/or decision may be based on factors not within their control, such as incomplete or inaccurate data provided by me or distortions of diagnostic images or specimens that may result from electronic transmissions. I understand that the practice of medicine is not an exact science and that Practitioner makes no warranties or guarantees regarding treatment outcomes. I acknowledge that I will receive a copy of this consent concurrently upon execution via email to the email address I last provided but may also request a printed copy by calling the office of the [facility].  I understand that my insurance will be billed for this visit.   I have read or had this consent read to me.  I understand the contents of this consent, which adequately explains the benefits and risks of the Services being provided via telemedicine.  I have been provided ample opportunity to ask questions regarding this consent and the Services and have had my questions answered to my satisfaction.  I give my informed consent for the services to be provided through the use of telemedicine in my medical care  By participating in this telemedicine visit I agree to the above.

## 2019-08-15 ENCOUNTER — Telehealth: Payer: Self-pay | Admitting: Neurology

## 2019-08-15 NOTE — Telephone Encounter (Signed)
Pt has called asking if her appointment on Wed. Of next week can be virtual or if she needs to come in for this type of an appointment.  Please call

## 2019-08-17 ENCOUNTER — Encounter: Payer: Self-pay | Admitting: Family Medicine

## 2019-08-18 NOTE — Telephone Encounter (Signed)
I called pt. She would like to schedule a virtual visit because she does not have reliable transportation.  Pt understands that although there may be some limitations with this type of visit, we will take all precautions to reduce any security or privacy concerns.  Pt understands that this will be treated like an in office visit and we will file with pt's insurance, and there may be a patient responsible charge related to this service.  Mychart video visit scheduled with Janett Billow, NP on 09/15/19 at 9:15am. Pt understands mychart video visit instructions and of appt date and time.

## 2019-08-18 NOTE — Telephone Encounter (Signed)
Pt requesting a call back to discuss 

## 2019-08-20 ENCOUNTER — Ambulatory Visit: Payer: Medicare Other | Admitting: Adult Health

## 2019-08-20 NOTE — Progress Notes (Signed)
[facility] Summary Report / Loop Recorder 

## 2019-08-21 ENCOUNTER — Telehealth: Payer: Self-pay | Admitting: Orthopedic Surgery

## 2019-08-21 NOTE — Telephone Encounter (Signed)
IC advised it was a universal sling that we have been using for the injured shoulder/arm.

## 2019-08-21 NOTE — Telephone Encounter (Signed)
[name] from [facility] called wanting to know if the mobilizer was for the right or left shoulder.  Please call her to confirm.  [phone]

## 2019-08-28 ENCOUNTER — Ambulatory Visit: Payer: Self-pay | Admitting: Neurology

## 2019-08-30 ENCOUNTER — Other Ambulatory Visit: Payer: Self-pay | Admitting: Family Medicine

## 2019-08-30 DIAGNOSIS — R112 Nausea with vomiting, unspecified: Secondary | ICD-10-CM

## 2019-08-31 ENCOUNTER — Other Ambulatory Visit: Payer: Self-pay | Admitting: Family Medicine

## 2019-09-02 ENCOUNTER — Other Ambulatory Visit: Payer: Self-pay | Admitting: Family Medicine

## 2019-09-02 DIAGNOSIS — R112 Nausea with vomiting, unspecified: Secondary | ICD-10-CM

## 2019-09-08 ENCOUNTER — Encounter: Payer: Self-pay | Admitting: Family Medicine

## 2019-09-08 ENCOUNTER — Other Ambulatory Visit: Payer: Self-pay | Admitting: Family Medicine

## 2019-09-08 DIAGNOSIS — R197 Diarrhea, unspecified: Secondary | ICD-10-CM

## 2019-09-12 ENCOUNTER — Other Ambulatory Visit: Payer: Self-pay | Admitting: Family Medicine

## 2019-09-12 DIAGNOSIS — R112 Nausea with vomiting, unspecified: Secondary | ICD-10-CM

## 2019-09-15 ENCOUNTER — Encounter: Payer: Self-pay | Admitting: Adult Health

## 2019-09-15 ENCOUNTER — Telehealth: Payer: Self-pay | Admitting: Adult Health

## 2019-09-15 ENCOUNTER — Ambulatory Visit (INDEPENDENT_AMBULATORY_CARE_PROVIDER_SITE_OTHER): Payer: Medicare Other | Admitting: *Deleted

## 2019-09-15 DIAGNOSIS — I639 Cerebral infarction, unspecified: Secondary | ICD-10-CM

## 2019-09-15 LAB — CUP PACEART REMOTE DEVICE CHECK
Date Time Interrogation Session: 20200919183941
Implantable Pulse Generator Implant Date: 20191230

## 2019-09-16 ENCOUNTER — Other Ambulatory Visit: Payer: Self-pay | Admitting: Family Medicine

## 2019-09-16 DIAGNOSIS — R112 Nausea with vomiting, unspecified: Secondary | ICD-10-CM

## 2019-09-23 ENCOUNTER — Encounter: Payer: Self-pay | Admitting: Family Medicine

## 2019-09-23 ENCOUNTER — Other Ambulatory Visit: Payer: Self-pay | Admitting: Family Medicine

## 2019-09-23 DIAGNOSIS — E119 Type 2 diabetes mellitus without complications: Secondary | ICD-10-CM

## 2019-09-23 NOTE — Progress Notes (Signed)
[facility] Summary Report / Loop Recorder 

## 2019-09-24 ENCOUNTER — Other Ambulatory Visit: Payer: Self-pay

## 2019-09-24 ENCOUNTER — Ambulatory Visit (INDEPENDENT_AMBULATORY_CARE_PROVIDER_SITE_OTHER): Payer: Medicare Other | Admitting: *Deleted

## 2019-09-24 DIAGNOSIS — Z23 Encounter for immunization: Secondary | ICD-10-CM

## 2019-09-24 NOTE — Progress Notes (Signed)
Pt tolerated vaccine well. Deseree Blount, CMA  

## 2019-09-29 ENCOUNTER — Telehealth (INDEPENDENT_AMBULATORY_CARE_PROVIDER_SITE_OTHER): Payer: Medicare Other | Admitting: Adult Health

## 2019-09-29 ENCOUNTER — Encounter: Payer: Self-pay | Admitting: Adult Health

## 2019-09-29 DIAGNOSIS — E1159 Type 2 diabetes mellitus with other circulatory complications: Secondary | ICD-10-CM

## 2019-09-29 DIAGNOSIS — Z794 Long term (current) use of insulin: Secondary | ICD-10-CM

## 2019-09-29 DIAGNOSIS — E785 Hyperlipidemia, unspecified: Secondary | ICD-10-CM

## 2019-09-29 DIAGNOSIS — G4733 Obstructive sleep apnea (adult) (pediatric): Secondary | ICD-10-CM | POA: Diagnosis not present

## 2019-09-29 DIAGNOSIS — I480 Paroxysmal atrial fibrillation: Secondary | ICD-10-CM

## 2019-09-29 DIAGNOSIS — I10 Essential (primary) hypertension: Secondary | ICD-10-CM

## 2019-09-29 DIAGNOSIS — I6350 Cerebral infarction due to unspecified occlusion or stenosis of unspecified cerebral artery: Secondary | ICD-10-CM

## 2019-09-29 DIAGNOSIS — Z9989 Dependence on other enabling machines and devices: Secondary | ICD-10-CM

## 2019-09-29 DIAGNOSIS — R2689 Other abnormalities of gait and mobility: Secondary | ICD-10-CM

## 2019-09-29 NOTE — Progress Notes (Signed)
I agree with the above plan 

## 2019-09-29 NOTE — Progress Notes (Addendum)
[facility] [address] [place]. [place] [address] [phone]       OFFICE FOLLOW UP NOTE  [name] Date of Birth:  [birth year] Medical Record Number:  [id number]   Reason for Referral:  hospital stroke follow up   Virtual Visit via Video Note  I connected with [name] on 09/29/19 at  9:45 AM EDT by a video enabled telemedicine application located remotely in my own home and verified that I am speaking with the correct person using two identifiers who was located at their own home.   I discussed the limitations of evaluation and management by telemedicine and the availability of in person appointments. The patient expressed understanding and agreed to proceed.   CHIEF COMPLAINT:  No chief complaint on file.   HPI:  Update 09/29/2019: [name] is a 68 year old female who is being seen today through telemedicine via MyChart for stroke follow-up and initial CPAP compliance visit.  Stroke: Residual stroke deficits of left hemiparesis.  She was recently evaluated by her PCP for request of electric wheelchair due to increased difficulty with ambulation with long distance resulting in frequent falls. She has since received an electric w/c for long distance. She was evaluated in the ED on 07/06/2019 due to a fall resulting in left humerus fracture.  She does endorse improvement of function. Continues to follow with orthopedics. Continues to ambulate with RW without any recent falls. Continues on Eliquis without bleeding or bruising. Continues on atorvastatin without myalgias.  Blood pressure monitored at home which has been stable.  No further concerns at this time.  Sleep apnea on CPAP: She underwent split-night study on 05/26/2019 which showed severe obstructive sleep apnea with AHI 78.4/h and recommended initiation of AutoPap for management.  Unable to undergo CPAP titration studies at the time of diagnosis due to pandemic  restrictions.  Initial compliance report from 08/26/2019- 09/24/2019 shows 30 out of 30 usage days for 100% compliance and 28 days greater than 4 hours for 93% compliance.  Average usage 6 hours and 38 minutes.  Residual AHI 29.9.  Apnea index central 18.9, obstructive 7.4 and unknown 0.5.  Leaks in the 95th percentile 11.4 L/min.  Pressure in the 95th percentile 14.0 cm H2O with a min pressure 7 cm H2O and max pressure 15 cm H2O with EPR level 2.  She has been tolerating well with improvement of her sleep. She uses [employer] daily to clean her CPAP. Continues to follow with [employer] for supplies needed.  No concerns regarding her machine.    Update 02/19/2019: [name] is a 68 year old female who returns today for follow-up after right pontine infarct in 09/2018.  She unfortunately sustained a couple falls and was evaluated in the ED on 12/13/2018 for right knee pain.  Per review of the ED notes, she was fearful of returning home due to safety concerns and was requesting to be placed at rehab facility therefore she was discharged to [name] for ongoing therapy.  She has been since discharged home.  Unfortunately due to lack of transportation, she was unable to participate in therapies prior to 12/13/2018 admission.  As she has since obtained SCAT assistance, she is currently participating in physical therapy at neuro rehab for ongoing deficits and overall feeling of being weaker leading to increased difficulty in walking. She currently lives with her sister who works full time leaving patient at home during the day. She is able to maintain ADL's without assistance and  able to cook and clean without assistance. She is currently using rollator walker with a recent fall on Sunday stating her feet "got twisted". She did attempt 30 day cardiac monitor but due to allergic skin reaction, unable to fully complete. she underwent loop recorder placement on 12/23/2018 which has shown atrial fibrillation on 02/09/2019  and has appointment to follow-up in AF clinic today for discussion of initiating [place].  She continues on Plavix at this time without side effects of bleeding or bruising.  Continues on atorvastatin without side effects myalgias.  Blood pressure today initially elevated but upon manual recheck 148/90.  She continues to monitor glucose levels at home and have been ranging in the low 100s.  Denies new or worsening stroke/TIA symptoms.    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  She was previously followed in this office Dr. Erlinda Hong for prior stroke.  History obtained from patient and chart review. Reviewed all radiology images and labs personally.  [name] is a 68 y.o. female with history of HTN, HLD, prior stroke, CHF, type I DB, thyroid cancer presenting with L hemiparesis and L sided numbness.  CT head reviewed and was negative for acute infarct but did show old left pontine lacune infarct.  CTA head and neck stable since imaging in 2016 which showed high-grade stenosis bilateral ICA segments and bilateral distal left VA V4.  MRI brain reviewed and showed right pontine infarct along with small vessel disease and old pontine lacunar infarcts.  2D echo showed an EF of 60 to 65% without evidence of cardiac thrombus but did show atrial fibrillation.  As there is no clear evidence on telemetry monitor of atrial fibrillation, TEE performed which was unremarkable without evidence of thrombus or PFO.  Recommended to undergo 30-day event cardiac monitor to rule out atrial fibrillation as during 2D echo showed possible AF.  Recommended a 30-day cardiac event monitor does not demonstrate AF, loop recorder will be considered.  Patient was on Plavix PTA and recommended DAPT for 3 weeks and then single agent alone.  LDL 48 and recommended continuation of atorvastatin 40  mg daily.  A1c 9.8 and recommended tight glycemic control with close PCP follow-up for DM management.  HTN stable during admission recommended long-term goal normotensive range.  Therapy recommended home health PT/OT and was discharged in stable condition.  Patient is being seen today for hospital follow-up.  She did have cardiac monitor started on 10/28/2018 and will be completed on 11/27/2018. She continues to have left sided weakness with some improvement. She does endorse numbness and heaviness sensation in left leg. She does endorse muscle stiffness in left arm and neck but denies pain. She has recently completed home health PT/OT. She would like to continue outpatient therapy at our neuro rehab. She continues to use rollator walker when she is outside which she was using prior. She continues on asprin and plavix without side effects of bleeding or bruising. Recent episode of GERD symptoms and was told this could possibly be due to use of aspirin. Continues to take lipitor without side effects of myalgias. Blood pressure satisfactory 142/71.  No further concerns at this time.  Denies new or worsening stroke/TIA symptoms. On a side note, she does complain of left toe pain and swelling which per review of epic notes, she has reached out to her PCP in this  regards and is recommended to be seen in office as this could be possibly related to gout or a different type of condition that may need to be worked up.  She states she has a difficult time scheduling too many appointments soon together as she is unable to drive and does not want her family members missing too much work.  She does have an appointment with endocrinology on Monday and after that we will be being seen by cardiology for completion of 30-day cardiac monitor and then plans on scheduling appointment with PCP.    ROS:   14 system review of systems performed and negative with exception of see HPI  PMH:  Past Medical History:  Diagnosis Date    Abnormal echocardiogram    Anemia    Arthritis    CHF (congestive heart failure) (HCC)    Depression    GERD (gastroesophageal reflux disease)    Hemorrhoids    Hyperlipidemia    Hypertension    Hypothyroidism    Seasonal allergies    Stroke ([place])    Thyroid cancer ([place])    per pt S/p Total Thyroidectomy with Radioactive Iodine Therapy   Type 1 diabetes mellitus ([place])     PSH:  Past Surgical History:  Procedure Laterality Date   ABDOMINAL ANGIOGRAM N/A 05/14/2012   Procedure: ABDOMINAL ANGIOGRAM;  Surgeon: Laverda Page, MD;  Location: [facility] CATH LAB;  Service: Cardiovascular;  Laterality: N/A;   ABDOMINAL HYSTERECTOMY  1995   partial   ANKLE FRACTURE SURGERY Right    CARDIAC CATHETERIZATION     with coronary angiogram   COLONOSCOPY N/A 09/09/2014   Procedure: COLONOSCOPY;  Surgeon: Gatha Mayer, MD;  Location: [place];  Service: Endoscopy;  Laterality: N/A;   ESOPHAGOGASTRODUODENOSCOPY (EGD) WITH PROPOFOL N/A 12/03/2018   Procedure: ESOPHAGOGASTRODUODENOSCOPY (EGD) WITH PROPOFOL;  Surgeon: Doran Stabler, MD;  Location: [place];  Service: Gastroenterology;  Laterality: N/A;   LEFT AND RIGHT HEART CATHETERIZATION WITH CORONARY ANGIOGRAM N/A 05/14/2012   Procedure: LEFT AND RIGHT HEART CATHETERIZATION WITH CORONARY ANGIOGRAM;  Surgeon: Laverda Page, MD;  Location: [facility] CATH LAB;  Service: Cardiovascular;  Laterality: N/A;   LOOP RECORDER INSERTION N/A 12/23/2018   Procedure: LOOP RECORDER INSERTION;  Surgeon: Constance Haw, MD;  Location: [place] CV LAB;  Service: Cardiovascular;  Laterality: N/A;   MINI METER   09/08/2014   ELECTRONIC INSULIN PUMP   TEE WITHOUT CARDIOVERSION N/A 10/16/2018   Procedure: TRANSESOPHAGEAL ECHOCARDIOGRAM (TEE);  Surgeon: Buford Dresser, MD;  Location: [facility] ENDOSCOPY;  Service: Cardiovascular;  Laterality: N/A;   THYROIDECTOMY  2006    Social History:  Social History   Socioeconomic  History   Marital status: Divorced    Spouse name: Not on file   Number of children: 1   Years of education: Not on file   Highest education level: Not on file  Occupational History   Occupation: retired Scientist, research (physical sciences) strain: Not on file   Food insecurity    Worry: Not on file    Inability: Not on file   Transportation needs    Medical: Not on file    Non-medical: Not on file  Tobacco Use   Smoking status: Never Smoker   Smokeless tobacco: Never Used  Substance and Sexual Activity   Alcohol use: No   Drug use: No   Sexual activity: Not on file  Lifestyle   Physical activity    Days per week: Not  on file    Minutes per session: Not on file   Stress: Not on file  Relationships   Social connections    Talks on phone: Not on file    Gets together: Not on file    Attends religious service: Not on file    Active member of club or organization: Not on file    Attends meetings of clubs or organizations: Not on file    Relationship status: Not on file   Intimate partner violence    Fear of current or ex partner: Not on file    Emotionally abused: Not on file    Physically abused: Not on file    Forced sexual activity: Not on file  Other Topics Concern   Not on file  Social History Narrative   Retired Pharmacist, hospital, high school teacher taught mass. One son, helping to care for her granddaughter. No caffeine. Updated 07/20/2014.    Family History:  Family History  Problem Relation Age of Onset   Heart disease Mother    Hyperlipidemia Mother    Hypertension Mother    Renal Disease Mother        insufficiency   Heart attack Mother        in his 17's   Early death Father        Head Injury at Work   Hyperlipidemia Father    Hypertension Father    Stroke Son    Diabetes Maternal Aunt    Prostate cancer Paternal Uncle    Heart disease Maternal Aunt    Heart failure Maternal Grandfather    Heart disease Maternal  Grandfather    Heart attack Maternal Grandfather        Died in his 68's   Breast cancer Neg Hx     Medications:   Current Outpatient Medications on File Prior to Visit  Medication Sig Dispense Refill   acetaminophen (TYLENOL) 500 MG tablet Take 1,000 mg by mouth as needed for mild pain.     apixaban (ELIQUIS) 5 MG TABS tablet Take 1 tablet (5 mg total) by mouth 2 (two) times daily. 180 tablet 2   atorvastatin (LIPITOR) 80 MG tablet Take 1 tablet (80 mg total) by mouth daily. 90 tablet 2   CRANBERRY EXTRACT PO Take 1 capsule by mouth daily.     diclofenac sodium (VOLTAREN) 1 % GEL Apply 4 g topically 4 (four) times daily. 100 g 2   fexofenadine (ALLEGRA) 30 MG tablet Take 30 mg by mouth daily as needed (allergies).      FLUoxetine (PROZAC) 10 MG capsule Take 1 capsule (10 mg total) by mouth daily. 90 capsule 1   FLUoxetine (PROZAC) 20 MG tablet Take 1 tablet (20 mg total) by mouth daily. Take with 10mg  for 30mg  total. 90 tablet 2   gabapentin (NEURONTIN) 300 MG capsule Take 1 capsule (300 mg total) by mouth at bedtime. 90 capsule 3   HYDROcodone-acetaminophen (NORCO/VICODIN) 5-325 MG tablet Take 2 tablets by mouth every 4 (four) hours as needed. 20 tablet 0   Insulin Human (INSULIN PUMP) SOLN Inject into the skin continuous. humalog insulin     levothyroxine (SYNTHROID, LEVOTHROID) 175 MCG tablet Take 175 mcg by mouth daily before breakfast. Every day but Sunday  12   loperamide (IMODIUM) 2 MG capsule TAKE 1 CAPSULE( 2 MG TOTAL) BY MOUTH FOUR TIMES DAILY AS NEEDED FOR DIARRHEA OR LOOSE STOOLS 30 capsule 0   losartan (COZAAR) 25 MG tablet Take 1 tablet (25 mg total)  by mouth at bedtime. 90 tablet 3   metoprolol succinate (TOPROL-XL) 100 MG 24 hr tablet TAKE 1 TABLET BY MOUTH DAILY 90 tablet 1   ondansetron (ZOFRAN-ODT) 4 MG disintegrating tablet DISSOLVE 1 TABLET BY MOUTH EVERY 8 HOURS AS NEEDED FOR NAUSEA AND VOMITING 20 tablet 0   oxyCODONE (OXY IR/ROXICODONE) 5 MG  immediate release tablet Take 1 tablet (5 mg total) by mouth every 8 (eight) hours as needed for severe pain. 30 tablet 0   oxyCODONE-acetaminophen (PERCOCET) 5-325 MG tablet Take 1-2 tablets by mouth every 8 (eight) hours as needed for severe pain. 30 tablet 0   pantoprazole (PROTONIX) 40 MG tablet TAKE 1 TABLET BY MOUTH TWICE DAILY, BEFORE MEALS 180 tablet 0   potassium chloride (K-DUR) 10 MEQ tablet Take 1 tablet by mouth on days toresmide is taken 30 tablet 2   torsemide (DEMADEX) 10 MG tablet TAKE 1 TABLET BY MOUTH DAILY AS NEEDED FOR FLUID (Patient taking differently: Take 10 mg by mouth daily as needed (fluid). ) 90 tablet 0   [DISCONTINUED] ondansetron (ZOFRAN-ODT) 4 MG disintegrating tablet DISSOLVE ONE TABLET BY MOUTH EVERY 8 HOURS AS NEEDED FOR NAUSEA AND VOMITING 20 tablet 0   [DISCONTINUED] pantoprazole (PROTONIX) 40 MG tablet Take 1 tablet (40 mg total) by mouth daily. 90 tablet 0   No current facility-administered medications on file prior to visit.     Allergies:   Allergies  Allergen Reactions   Latex Rash     Physical Exam  General: well developed, well nourished, pleasant middle-aged African-American female, seated, in no evident distress Head: head normocephalic and atraumatic.    Neurologic Exam Mental Status: Awake and fully alert. Oriented to place and time. Recent and remote memory intact. Attention span, concentration and fund of knowledge appropriate. Mood and affect appropriate.  Cranial Nerves: Extraocular movements full without nystagmus. Hearing intact to voice. Facial sensation intact. Face, tongue, palate moves normally and symmetrically.  Shoulder shrug symmetric. Motor: No evidence of large muscle weakness per drift assessment; slightly decreased left hand dexterity Sensory.: intact to light touch Coordination: Rapid alternating movements normal in all extremities except slightly decreased left hand. Finger-to-nose and heel-to-shin performed  accurately bilaterally. Gait and Station: Deferred due to visit type and limited assistance for patient during visit Reflexes: UTA      Diagnostic Data (Labs, Imaging, Testing)  CT HEAD WO CONTRAST 10/13/2018 IMPRESSION: 1. No acute finding.  ASPECTS is 10. 2. 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  CT ANGIO HEAD W OR WO CONTRAST CT ANGIO NECK W OR WO CONTRAST 10/14/2018 IMPRESSION: 1. 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 There is comparatively little extracranial atherosclerosis, and no plaque or stenosis identified in the circle of Willis branches. 2. Stable CT appearance of the brain since yesterday. No acute or evolving infarct identified.  MR BRAIN WO CONTRAST 10/14/2018 IMPRESSION: 1. Motion degraded examination. Acute subcentimeter RIGHT pontine infarct. 2. 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  ECHOCARDIOGRAM 10/14/2018 Impressions: - Technically difficult study. Afib is noted. LVEF 60-65%, moderate   LVH, normal wall motion, normal biatrial size.  Echo TEE 10/16/2018 Study Conclusions - Left ventricle: Systolic function was normal. The estimated   ejection fraction was in the range of 60% to 65%. Wall motion was   normal; there were no regional wall motion abnormalities. - Aortic  valve: Trileaflet; mildly thickened, mildly calcified   leaflets. There was no significant regurgitation. - Mitral valve: There was trivial regurgitation. - Left atrium: No evidence of thrombus in the atrial cavity or   appendage. - Right atrium: No evidence of thrombus in the atrial cavity or   appendage. - Atrial septum: No defect or patent foramen ovale was identified.   Echo contrast study showed a trivial late right-to-left shunt, in   the baseline state. This likely represents non-cardiac  shunt. - Tricuspid valve: There was mild regurgitation.   ASSESSMENT: Jaileen Malkiewicz is a 68 y.o. year old female here with right pontine infarct on 10/13/2018 secondary to small vessel disease. Vascular risk factors include HTN, HLD, prior stroke, CHF, DM and thyroid cancer.  Residual deficits of mild decrease left hand dexterity and balance difficulties.  Recent sleep study on 05/26/2019 showed severe sleep apnea and CPAP initiated.  Today CPAP compliance report shows adequate compliance but suboptimal residual AHI.    PLAN:  1. Right pontine infarct: Continue clopidogrel 75 mg daily  and atorvastatin 80 mg for secondary stroke prevention.  Maintain strict control of hypertension with blood pressure goal below 130/90, diabetes with hemoglobin A1c goal below 6.5% and cholesterol with LDL cholesterol (bad cholesterol) goal below 70 mg/dL.  I also advised the patient to eat a healthy diet with plenty of whole grains, cereals, fruits and vegetables, exercise regularly with at least 30 minutes of continuous activity daily and maintain ideal body weight. 2. Residual mild weakness/balance difficulties: Highly encouraged ongoing use of Rollator walker for short distance and use of motorized wheelchair for long distance or difficulty ambulating for fall prevention.  Advised to continue stay active as tolerated 3. HTN: Advised to continue current treatment regimen.  Advised to continue to monitor at home along with continued follow-up with PCP for management 4. HLD: Advised to continue current treatment regimen along with continued follow-up with PCP for future prescribing and monitoring of lipid panel 5. DMII: Advised to continue to monitor glucose levels at home along with continued follow-up with PCP for management and monitoring 6. Atrial fibrillation: Continuation of Eliquis and ongoing follow-up with cardiology for monitoring and management 7. Sleep apnea on CPAP: Adequate compliance with  untreated apnea.  Order placed for titration study   Follow-up determined after titration study   Greater than 50% of time during this 25 minute non-face-to-face visit was spent on counseling, discussion regarding CPAP compliance report and further interventions needed, explanation of diagnosis of right pontine infarct, reviewing risk factor management of new A. fib, HTN, HLD, prior infarct, CHF and DM,  discussion regarding fall precautions especially with use of [place], planning of further management along with potential future management, and discussion with patient and answering all questions to patient satisfaction    Frann Rider, AGNP-BC  [facility] [address] [place], [place] [address]  Phone [phone] Fax [phone] Note: This document was prepared with digital dictation and possible smart phrase technology. Any transcriptional errors that result from this process are unintentional.  I reviewed the above note and documentation by the Nurse Practitioner and agree with the history, exam, assessment and plan as outlined above. I was available for consultation. Star Age, MD, PhD [facility])

## 2019-10-14 ENCOUNTER — Telehealth: Payer: Self-pay | Admitting: Family Medicine

## 2019-10-14 ENCOUNTER — Telehealth: Payer: Self-pay

## 2019-10-14 NOTE — Telephone Encounter (Signed)
LVM asking pt what day her niece would be able to take pt for her Covid test prior to CPAP study. Waiting for return call back from pt.

## 2019-10-14 NOTE — Telephone Encounter (Signed)
Patient wanted to speak to the nurse about what she can do for the diarrhea she is having, before/instead of making an appointment.  Please call her as soon as you can, at [phone].  Thank you.

## 2019-10-14 NOTE — Telephone Encounter (Signed)
Will forward to MD to advise on what patient may try at home before coming in for an appt.  Torianne Laflam,CMA

## 2019-10-16 ENCOUNTER — Other Ambulatory Visit (HOSPITAL_COMMUNITY)
Admission: RE | Admit: 2019-10-16 | Discharge: 2019-10-16 | Disposition: A | Discharge status: Home / Self Care | Payer: Medicare Other | Source: Ambulatory Visit | Attending: Neurology | Admitting: Neurology

## 2019-10-16 ENCOUNTER — Ambulatory Visit (INDEPENDENT_AMBULATORY_CARE_PROVIDER_SITE_OTHER): Payer: Medicare Other | Admitting: *Deleted

## 2019-10-16 DIAGNOSIS — I639 Cerebral infarction, unspecified: Secondary | ICD-10-CM

## 2019-10-16 DIAGNOSIS — Z01812 Encounter for preprocedural laboratory examination: Secondary | ICD-10-CM | POA: Diagnosis present

## 2019-10-16 DIAGNOSIS — I4891 Unspecified atrial fibrillation: Secondary | ICD-10-CM | POA: Diagnosis not present

## 2019-10-16 DIAGNOSIS — Z20828 Contact with and (suspected) exposure to other viral communicable diseases: Secondary | ICD-10-CM | POA: Diagnosis not present

## 2019-10-16 LAB — CUP PACEART REMOTE DEVICE CHECK
Date Time Interrogation Session: 20201022164458
Implantable Pulse Generator Implant Date: 20191230

## 2019-10-16 NOTE — Telephone Encounter (Signed)
Patient reports that her diarrhea resolved yesterday after taking Imodium and is starting the brat diet on her own.  She has no other concerns today.  [name] Kayceon Oki, DO PGY-3, [facility]

## 2019-10-19 LAB — NOVEL CORONAVIRUS, NAA (HOSP ORDER, SEND-OUT TO REF LAB; TAT 18-24 HRS): SARS-CoV-2, NAA: NOT DETECTED

## 2019-10-22 ENCOUNTER — Ambulatory Visit (INDEPENDENT_AMBULATORY_CARE_PROVIDER_SITE_OTHER): Payer: Medicare Other | Admitting: Neurology

## 2019-10-22 ENCOUNTER — Other Ambulatory Visit: Payer: Self-pay

## 2019-10-22 DIAGNOSIS — R9431 Abnormal electrocardiogram [ECG] [EKG]: Secondary | ICD-10-CM

## 2019-10-22 DIAGNOSIS — G4733 Obstructive sleep apnea (adult) (pediatric): Secondary | ICD-10-CM

## 2019-10-22 DIAGNOSIS — Z9989 Dependence on other enabling machines and devices: Secondary | ICD-10-CM

## 2019-10-22 DIAGNOSIS — G4720 Circadian rhythm sleep disorder, unspecified type: Secondary | ICD-10-CM

## 2019-10-28 NOTE — Progress Notes (Signed)
[facility] Summary Report / Loop Recorder 

## 2019-11-04 ENCOUNTER — Telehealth: Payer: Self-pay

## 2019-11-04 NOTE — Procedures (Signed)
S PATIENT'S NAME:  [name], [name] DOB:      05/27/1951      MR#:    [id number]     DATE OF RECORDING: 10/22/2019 REFERRING M.D.:  Dan Humphreys, NP Study Performed:   CPAP  Titration HISTORY: 68 year old right-handed woman with an underlying medical history of hyperlipidemia, hypertension, type 2 diabetes, paroxysmal A. fib, stroke, CHF, depression, anemia, arthritis, allergies, thyroid cancer with status post thyroidectomy and radioiodine therapy, and morbid obesity with a BMI of over 40, who presents for a full night titration study to optimize her treatment. She has been on autoPAP and her residual AHI has been elevated with a significant central apnea component. The patient endorsed the Epworth Sleepiness Scale at 15 points. BMI of 41.2 kg/m2. The patient's neck circumference measured 16.5 inches.  CURRENT MEDICATIONS: Tylenol, Eliquis, Lipitor, Voltarem, Allegra, Prozac, Neurontin, Synthroid, Cozaar, Toprol XL, Protonix.   PROCEDURE:  This is a multichannel digital polysomnogram utilizing the SomnoStar 11.2 system.  Electrodes and sensors were applied and monitored per AASM Specifications.   EEG, EOG, Chin and Limb EMG, were sampled at 200 Hz.  ECG, Snore and Nasal Pressure, Thermal Airflow, Respiratory Effort, CPAP Flow and Pressure, Oximetry was sampled at 50 Hz. Digital video and audio were recorded.      The patient requested a FFM and was fitted with a medium F20 FFM. CPAP was initiated at 6 cm H20 with heated humidity per AASM standards and pressure was advanced to 14 cmH20 because of hypopneas, apneas and desaturations. She had significant elevation in her AHI and also central apneas. She was therefore switched to BiPAP and titrated from 15/10 cm to a pressure of 22/16 cm, at which point her AHI was 1.7/hour with supine REM sleep achieved and O2 nadir of 88%. She did require a high pressure.   Lights Out was at 22:07 and Lights On at 04:22. Total recording time (TRT) was  375 minutes, with a total sleep time (TST) of 294.5 minutes. The patient's sleep latency to persistent sleep was 61 minutes. REM latency was 163.5 minutes, which is delayed. The sleep efficiency was 78.5 %.    SLEEP ARCHITECTURE: WASO (Wake after sleep onset)  was 70.5 minutes with mild sleep fragmentation noted and one longer period of wakefulness towards the end of the study. There were 44 minutes in Stage N1, 150 minutes Stage N2, 20.5 minutes Stage N3 and 80 minutes in Stage REM.  The percentage of Stage N1 was 14.9%, which is increased, Stage N2 was 50.9%, Stage N3 was 7.% and Stage R (REM sleep) was 27.2%, which is mildly increased. The arousals were noted as: 18 were spontaneous, 0 were associated with PLMs, 45 were associated with respiratory events.  RESPIRATORY ANALYSIS:  There was a total of 169 respiratory events: 8 obstructive apneas, 9 central apneas and 0 mixed apneas with a total of 17 apneas and an apnea index (AI) of 3.5 /hour. There were 152 hypopneas with a hypopnea index of 31./hour. The patient also had 0 respiratory event related arousals (RERAs).      The total APNEA/HYPOPNEA INDEX  (AHI) was 34.4 /hour and the total RESPIRATORY DISTURBANCE INDEX was 34.4 /hour  35 events occurred in REM sleep and 134 events in NREM. The REM AHI was 26.3 /hour versus a non-REM AHI of 37.5 /hour.  The patient spent 294.5 minutes of total sleep time in the supine position and 0 minutes in non-supine. The supine AHI was 34.5, versus a non-supine AHI  of 0.0.  OXYGEN SATURATION & C02:  The baseline 02 saturation was 98%, with the lowest being 87%. Time spent below 89% saturation equaled 1 minutes.  PERIODIC LIMB MOVEMENTS:  The patient had a total of 0 Periodic Limb Movements. The Periodic Limb Movement (PLM) index was 0 and the PLM Arousal index was 0 /hour.  Audio and video analysis did not show any abnormal or unusual movements, behaviors, phonations or vocalizations. The patient took one bathroom  break. The EKG was irregular with frequent PVCs and PACs noted, not clearly in keeping with a fib.   Post-study, the patient indicated that sleep was better than usual.   DIAGNOSIS 1. Obstructive Sleep Apnea  2. Insufficient treatment with CPAP 3. Dysfunctions associated with sleep stages or arousals from sleep 4. Non-specific abnormal EKG  PLANS/RECOMMENDATIONS: 1. This study demonstrates significant improvement of the patient's sleep apnea with BiPAP. She did require a high pressure of 22/16 cm and I will recommend she try it at home. She did not have resolution of her sleep apnea on autoPAP or CPAP. The patient should be reminded to be fully compliant with PAP therapy to improve sleep related symptoms and decrease long term cardiovascular risks. The patient should be reminded, that it may take up to 3 months to get fully used to using PAP with all planned sleep. The earlier full compliance is achieved, the better long term compliance tends to be. Please note that untreated obstructive sleep apnea may carry additional perioperative morbidity. Patients with significant obstructive sleep apnea should receive perioperative PAP therapy and the surgeons and particularly the anesthesiologist should be informed of the diagnosis and the severity of the sleep disordered breathing. 2. This study shows sleep fragmentation and abnormal sleep stage percentages; these are nonspecific findings and per se do not signify an intrinsic sleep disorder or a cause for the patient's sleep-related symptoms. Causes include (but are not limited to) the first night effect of the sleep study, circadian rhythm disturbances, medication effect or an underlying mood disorder or medical problem.  3. The patient should be cautioned not to drive, work at heights, or operate dangerous or heavy equipment when tired or sleepy. Review and reiteration of good sleep hygiene measures should be pursued with any patient. 4. The study showed  PACs and PVCs on single lead EKG; clinical correlation is recommended. She is followed by cardiology. 5. The patient will be seen in follow-up in the sleep clinic at [facility] for discussion of the test results, symptom and treatment compliance review, further management strategies, etc. The referring provider will be notified of the test results.   I certify that I have reviewed the entire raw data recording prior to the issuance of this report in accordance with the Standards of Accreditation of the [place] [employer] (AASM)  Star Age, MD,PhD Diplomat, American Board of Neurology and Sleep Medicine (Neurology and Sleep Medicine)

## 2019-11-04 NOTE — Addendum Note (Signed)
Addended by: Star Age on: 11/04/2019 08:12 AM   Modules accepted: Orders

## 2019-11-04 NOTE — Progress Notes (Signed)
Patient had a CPAP titration study on 10/22/19, as her autoPAP was not sufficient in treating her OSA.   Please call and inform patient that I have entered an order for treatment with positive airway pressure (PAP) treatment for obstructive sleep apnea (OSA). She did well during the latest sleep study with BiPAP; CPAP alone was not sufficient in treating her sleep apnea. We will, therefore, arrange for a BiPAP machine for home use through her DME (durable medical equipment) company; we will see the patient back in follow-up in about 10 weeks. Please also explain to the patient that I will be looking out for compliance data, which can be downloaded from the machine (stored on an SD card, that is inserted in the machine) or via remote access through a modem, that is built into the machine. At the time of the followup appointment we will discuss sleep study results and how it is going with PAP treatment at home. Please advise patient to bring Her machine at the time of the first FU visit, even though this is cumbersome. Bringing the machine for every visit after that will likely not be needed, but often helps for the first visit to troubleshoot if needed. Please re-enforce the importance of compliance with treatment and the need for [place] to monitor compliance data - often an insurance requirement and actually good feedback for the patient as far as how they are doing.  Also remind patient, that any interim PAP machine or mask issues should be first addressed with the DME company, as they can often help better with technical and mask fit issues.  Please also make sure, the patient has a follow-up appointment with me or Janett Billow in about 10 weeks from the setup date, thanks.   Star Age, MD, PhD [facility])

## 2019-11-04 NOTE — Telephone Encounter (Signed)
-----   Message from Star Age, MD sent at 11/04/2019  8:11 AM EST ----- Patient had a CPAP titration study on 10/22/19, as her autoPAP was not sufficient in treating her OSA.   Please call and inform patient that I have entered an order for treatment with positive airway pressure (PAP) treatment for obstructive sleep apnea (OSA). She did well during the latest sleep study with BiPAP; CPAP alone was not sufficient in treating her sleep apnea. We will, therefore, arrange for a BiPAP machine for home use through her DME (durable medical equipment) company; we will see the patient back in follow-up in about 10 weeks. Please also explain to the patient that I will be looking out for compliance data, which can be downloaded from the machine (stored on an SD card, that is inserted in the machine) or via remote access through a modem, that is built into the machine. At the time of the followup appointment we will discuss sleep study results and how it is going with PAP treatment at home. Please advise patient to bring Her machine at the time of the first FU visit, even though this is cumbersome. Bringing the machine for every visit after that will likely not be needed, but often helps for the first visit to troubleshoot if needed. Please re-enforce the importance of compliance with treatment and the need for [place] to monitor compliance data - often an insurance requirement and actually good feedback for the patient as far as how they are doing.  Also remind patient, that any interim PAP machine or mask issues should be first addressed with the DME company, as they can often help better with technical and mask fit issues.  Please also make sure, the patient has a follow-up appointment with me or Janett Billow in about 10 weeks from the setup date, thanks.   Star Age, MD, PhD [facility])

## 2019-11-04 NOTE — Telephone Encounter (Signed)
I called pt to discuss her sleep study results. No answer, left a message asking her to call me back. 

## 2019-11-05 NOTE — Telephone Encounter (Signed)
Pt returning call please call back °

## 2019-11-05 NOTE — Telephone Encounter (Signed)
I called pt again to discuss. No answer, left a message asking her to call me back. 

## 2019-11-05 NOTE — Telephone Encounter (Signed)
I called [name]. I advised [name] that Dr. Rexene Alberts reviewed their sleep study results and found that [name] did well with a bipap during her sleep study. Dr. Rexene Alberts recommends that [name] start a bipap at home. I reviewed PAP compliance expectations with the [name]. [name] is agreeable to starting a BiPAP. I advised [name] that an order will be sent to a DME, [employer], and [name] will call the [name] within about one week after they file with the [name]'s insurance. [name] will show the [name] how to use the machine, fit for masks, and troubleshoot the BiPAP if needed. A follow up appt was made for insurance purposes with Janett Billow, NP on 02/03/20 at 8:45am with Janett Billow, NP. [name] verbalized understanding to arrive 15 minutes early and bring their BiPAP. A letter with all of this information in it will be mailed to the [name] as a reminder. I verified with the [name] that the address we have on file is correct. [name] verbalized understanding of results. [name] had no questions at this time but was encouraged to call back if questions arise. I have sent the order to [facility] and have received confirmation that they have received the order.

## 2019-11-09 ENCOUNTER — Encounter: Payer: Self-pay | Admitting: Adult Health

## 2019-11-12 ENCOUNTER — Encounter (HOSPITAL_COMMUNITY): Payer: Self-pay | Admitting: Physician Assistant

## 2019-11-12 ENCOUNTER — Other Ambulatory Visit: Payer: Self-pay

## 2019-11-12 ENCOUNTER — Ambulatory Visit (HOSPITAL_COMMUNITY)
Admission: RE | Admit: 2019-11-12 | Discharge: 2019-11-12 | Disposition: A | Discharge status: Home / Self Care | Payer: Medicare Other | Source: Ambulatory Visit | Attending: Physician Assistant | Admitting: Physician Assistant

## 2019-11-12 VITALS — BP 170/70 | HR 70 | Ht 67.0 in | Wt 242.0 lb

## 2019-11-12 DIAGNOSIS — Z7901 Long term (current) use of anticoagulants: Secondary | ICD-10-CM | POA: Diagnosis not present

## 2019-11-12 DIAGNOSIS — Z8249 Family history of ischemic heart disease and other diseases of the circulatory system: Secondary | ICD-10-CM | POA: Diagnosis not present

## 2019-11-12 DIAGNOSIS — Z79890 Hormone replacement therapy (postmenopausal): Secondary | ICD-10-CM | POA: Insufficient documentation

## 2019-11-12 DIAGNOSIS — I5030 Unspecified diastolic (congestive) heart failure: Secondary | ICD-10-CM | POA: Insufficient documentation

## 2019-11-12 DIAGNOSIS — E108 Type 1 diabetes mellitus with unspecified complications: Secondary | ICD-10-CM | POA: Insufficient documentation

## 2019-11-12 DIAGNOSIS — Z794 Long term (current) use of insulin: Secondary | ICD-10-CM | POA: Diagnosis not present

## 2019-11-12 DIAGNOSIS — Z8673 Personal history of transient ischemic attack (TIA), and cerebral infarction without residual deficits: Secondary | ICD-10-CM | POA: Diagnosis not present

## 2019-11-12 DIAGNOSIS — E669 Obesity, unspecified: Secondary | ICD-10-CM | POA: Diagnosis not present

## 2019-11-12 DIAGNOSIS — E785 Hyperlipidemia, unspecified: Secondary | ICD-10-CM | POA: Insufficient documentation

## 2019-11-12 DIAGNOSIS — K219 Gastro-esophageal reflux disease without esophagitis: Secondary | ICD-10-CM | POA: Insufficient documentation

## 2019-11-12 DIAGNOSIS — I480 Paroxysmal atrial fibrillation: Secondary | ICD-10-CM | POA: Diagnosis not present

## 2019-11-12 DIAGNOSIS — D6869 Other thrombophilia: Secondary | ICD-10-CM | POA: Diagnosis not present

## 2019-11-12 DIAGNOSIS — Z6837 Body mass index (BMI) 37.0-37.9, adult: Secondary | ICD-10-CM | POA: Insufficient documentation

## 2019-11-12 DIAGNOSIS — I110 Hypertensive heart disease with heart failure: Secondary | ICD-10-CM | POA: Diagnosis not present

## 2019-11-12 DIAGNOSIS — Z79899 Other long term (current) drug therapy: Secondary | ICD-10-CM | POA: Insufficient documentation

## 2019-11-12 DIAGNOSIS — Z85850 Personal history of malignant neoplasm of thyroid: Secondary | ICD-10-CM | POA: Diagnosis not present

## 2019-11-12 DIAGNOSIS — G4733 Obstructive sleep apnea (adult) (pediatric): Secondary | ICD-10-CM | POA: Diagnosis not present

## 2019-11-12 MED ORDER — APIXABAN 5 MG PO TABS: 5.0000 mg | ORAL_TABLET | Freq: Two times a day (BID) | ORAL | 2 refills | Status: DC

## 2019-11-12 MED ORDER — METOPROLOL SUCCINATE ER 100 MG PO TB24: 100.0000 mg | ORAL_TABLET | Freq: Every day | ORAL | 1 refills | Status: DC

## 2019-11-12 NOTE — Progress Notes (Signed)
Primary Care Physician: Shirley, [name], DO Primary Cardiologist: Dr Marlou Porch Primary Electrophysiologist: Dr Curt Bears Referring Physician: Dr Marta Lamas [name] is a 68 y.o. female with a history of HTN, type I DM, HLD, thyroid cancer s/p thyroidectomy, CVA, GERD, and paroxysmal atrial fibrillation who presents for consultation in the [facility]. Patient diagnosed with CVA on 10/13/18 and was seen to have frequent atrial ectopy on telemetry. ILR placed by Dr Curt Bears. The patient was initially diagnosed with atrial fibrillation 02/09/19 on ILR, confirmed by Dr Curt Bears. The episode lasted for about 30 minutes. She was asymptomatic during the episode. Patient was hospitalized for DKA and ileus 03/2019 and her loop recorder did show increased AF burden around the time of her admission. She also had a mechanical fall on 07/06/19 with a fracture of he humerus. Per ortho notes, no surgical intervention planned. Her AF burden dramatically increased around the time of her fall but has decreased again over the last several days. She had no awareness of her arrhythmia and her rates appear to be controlled. She is on Eliquis for a CHADS2VASC score of 5.   On follow up today, patient reports she has done very well. She denies heart racing or palpitations. ILR shows only one brief episode of afib. She states that her BP has been well controlled at home. She had a salty meal this AM. She also reports that she started her new BiPap machine last night.  Today, she denies symptoms of palpitations, chest pain, orthopnea, PND, lower extremity edema, dizziness, presyncope, syncope, bleeding, or neurologic sequela. The patient is tolerating medications without difficulties and is otherwise without complaint today.    Atrial Fibrillation Risk Factors:  she does have symptoms or diagnosis of sleep apnea.  She is compliant with her CPAP therapy. she does not have a history of  rheumatic fever. she does not have a history of alcohol use. The patient does not have a history of early familial atrial fibrillation or other arrhythmias.  she has a BMI of Body mass index is 37.9 kg/m.Marland Kitchen Filed Weights   11/12/19 0938  Weight: 109.8 kg    Family History  Problem Relation Age of Onset  . Heart disease Mother   . Hyperlipidemia Mother   . Hypertension Mother   . Renal Disease Mother        insufficiency  . Heart attack Mother        in his 30's  . Early death Father        Head Injury at Work  . Hyperlipidemia Father   . Hypertension Father   . Stroke Son   . Diabetes Maternal Aunt   . Prostate cancer Paternal Uncle   . Heart disease Maternal Aunt   . Heart failure Maternal Grandfather   . Heart disease Maternal Grandfather   . Heart attack Maternal Grandfather        Died in his 39's  . Breast cancer Neg Hx      Atrial Fibrillation Management history:  Previous antiarrhythmic drugs: none Previous cardioversions: none Previous ablations: none CHADS2VASC score: 5 (female, age, CVA, DM) Anticoagulation history: Eliquis    Past Medical History:  Diagnosis Date  . Abnormal echocardiogram   . Anemia   . Arthritis   . CHF (congestive heart failure) ([place])   . Depression   . GERD (gastroesophageal reflux disease)   . Hemorrhoids   . Hyperlipidemia   . Hypertension   . Hypothyroidism   .  Seasonal allergies   . Stroke ([place])   . Thyroid cancer ([place])    per pt S/p Total Thyroidectomy with Radioactive Iodine Therapy  . Type 1 diabetes mellitus ([place])    Past Surgical History:  Procedure Laterality Date  . ABDOMINAL ANGIOGRAM N/A 05/14/2012   Procedure: ABDOMINAL ANGIOGRAM;  Surgeon: Laverda Page, MD;  Location: [facility] CATH LAB;  Service: Cardiovascular;  Laterality: N/A;  . ABDOMINAL HYSTERECTOMY  1995   partial  . ANKLE FRACTURE SURGERY Right   . CARDIAC CATHETERIZATION     with coronary angiogram  . COLONOSCOPY N/A 09/09/2014   Procedure:  COLONOSCOPY;  Surgeon: Gatha Mayer, MD;  Location: [facility];  Service: Endoscopy;  Laterality: N/A;  . ESOPHAGOGASTRODUODENOSCOPY (EGD) WITH PROPOFOL N/A 12/03/2018   Procedure: ESOPHAGOGASTRODUODENOSCOPY (EGD) WITH PROPOFOL;  Surgeon: Doran Stabler, MD;  Location: [facility];  Service: Gastroenterology;  Laterality: N/A;  . LEFT AND RIGHT HEART CATHETERIZATION WITH CORONARY ANGIOGRAM N/A 05/14/2012   Procedure: LEFT AND RIGHT HEART CATHETERIZATION WITH CORONARY ANGIOGRAM;  Surgeon: Laverda Page, MD;  Location: [facility] CATH LAB;  Service: Cardiovascular;  Laterality: N/A;  . LOOP RECORDER INSERTION N/A 12/23/2018   Procedure: LOOP RECORDER INSERTION;  Surgeon: Constance Haw, MD;  Location: [place] CV LAB;  Service: Cardiovascular;  Laterality: N/A;  . MINI METER   09/08/2014   ELECTRONIC INSULIN PUMP  . TEE WITHOUT CARDIOVERSION N/A 10/16/2018   Procedure: TRANSESOPHAGEAL ECHOCARDIOGRAM (TEE);  Surgeon: Buford Dresser, MD;  Location: [facility] ENDOSCOPY;  Service: Cardiovascular;  Laterality: N/A;  . THYROIDECTOMY  2006    Current Outpatient Medications  Medication Sig Dispense Refill  . acetaminophen (TYLENOL) 500 MG tablet Take 1,000 mg by mouth as needed for mild pain.    Marland Kitchen apixaban (ELIQUIS) 5 MG TABS tablet Take 1 tablet (5 mg total) by mouth 2 (two) times daily. 180 tablet 2  . atorvastatin (LIPITOR) 80 MG tablet Take 1 tablet (80 mg total) by mouth daily. 90 tablet 2  . CRANBERRY EXTRACT PO Take 1 capsule by mouth daily.    . fexofenadine (ALLEGRA) 30 MG tablet Take 30 mg by mouth daily as needed (allergies).     Marland Kitchen FLUoxetine (PROZAC) 10 MG capsule Take 1 capsule (10 mg total) by mouth daily. 90 capsule 1  . FLUoxetine (PROZAC) 20 MG tablet Take 1 tablet (20 mg total) by mouth daily. Take with 10mg  for 30mg  total. 90 tablet 2  . gabapentin (NEURONTIN) 300 MG capsule Take 1 capsule (300 mg total) by mouth at bedtime. 90 capsule 3  . HYDROcodone-acetaminophen  (NORCO/VICODIN) 5-325 MG tablet Take 2 tablets by mouth every 4 (four) hours as needed. 20 tablet 0  . Insulin Human (INSULIN PUMP) SOLN Inject into the skin continuous. humalog insulin    . levothyroxine (SYNTHROID, LEVOTHROID) 175 MCG tablet Take 175 mcg by mouth daily before breakfast. Every day but Sunday  12  . loperamide (IMODIUM) 2 MG capsule TAKE 1 CAPSULE( 2 MG TOTAL) BY MOUTH FOUR TIMES DAILY AS NEEDED FOR DIARRHEA OR LOOSE STOOLS 30 capsule 0  . losartan (COZAAR) 25 MG tablet Take 1 tablet (25 mg total) by mouth at bedtime. 90 tablet 3  . metoprolol succinate (TOPROL-XL) 100 MG 24 hr tablet TAKE 1 TABLET BY MOUTH DAILY 90 tablet 1  . ondansetron (ZOFRAN-ODT) 4 MG disintegrating tablet DISSOLVE 1 TABLET BY MOUTH EVERY 8 HOURS AS NEEDED FOR NAUSEA AND VOMITING 20 tablet 0  . oxyCODONE (OXY IR/ROXICODONE) 5 MG immediate release  tablet Take 1 tablet (5 mg total) by mouth every 8 (eight) hours as needed for severe pain. 30 tablet 0  . pantoprazole (PROTONIX) 40 MG tablet TAKE 1 TABLET BY MOUTH TWICE DAILY, BEFORE MEALS 180 tablet 0  . potassium chloride (K-DUR) 10 MEQ tablet Take 1 tablet by mouth on days toresmide is taken 30 tablet 2  . torsemide (DEMADEX) 10 MG tablet TAKE 1 TABLET BY MOUTH DAILY AS NEEDED FOR FLUID (Patient taking differently: Take 10 mg by mouth daily as needed (fluid). ) 90 tablet 0   No current facility-administered medications for this encounter.     Allergies  Allergen Reactions  . Latex Rash    Social History   Socioeconomic History  . Marital status: Divorced    Spouse name: Not on file  . Number of children: 1  . Years of education: Not on file  . Highest education level: Not on file  Occupational History  . Occupation: retired Tour manager  . Financial resource strain: Not on file  . Food insecurity    Worry: Not on file    Inability: Not on file  . Transportation needs    Medical: Not on file    Non-medical: Not on file  Tobacco Use   . Smoking status: Never Smoker  . Smokeless tobacco: Never Used  Substance and Sexual Activity  . Alcohol use: No  . Drug use: No  . Sexual activity: Not on file  Lifestyle  . Physical activity    Days per week: Not on file    Minutes per session: Not on file  . Stress: Not on file  Relationships  . Social Herbalist on phone: Not on file    Gets together: Not on file    Attends religious service: Not on file    Active member of club or organization: Not on file    Attends meetings of clubs or organizations: Not on file    Relationship status: Not on file  . Intimate partner violence    Fear of current or ex partner: Not on file    Emotionally abused: Not on file    Physically abused: Not on file    Forced sexual activity: Not on file  Other Topics Concern  . Not on file  Social History Narrative   Retired Pharmacist, hospital, high school teacher taught mass. One son, helping to care for her granddaughter. No caffeine. Updated 07/20/2014.     ROS- All systems are reviewed and negative except as per the HPI above.  Physical Exam: Vitals:   11/12/19 0938  BP: (!) 170/70  Pulse: 70  Weight: 109.8 kg  Height: 5\' 7"  (1.702 m)    GEN- The patient is well appearing obese female, alert and oriented x 3 today.   HEENT-head normocephalic, atraumatic, sclera clear, conjunctiva pink, hearing intact, trachea midline. Lungs- Clear to ausculation bilaterally, normal work of breathing Heart- Regular rate and rhythm, occasional ectopic beats, no murmurs, rubs or gallops  GI- soft, NT, ND, + BS Extremities- no clubbing, cyanosis, or edema MS- no significant deformity or atrophy Skin- no rash or lesion Psych- euthymic mood, full affect Neuro- strength and sensation are intact   Wt Readings from Last 3 Encounters:  11/12/19 109.8 kg  07/06/19 108.9 kg  04/16/19 114.3 kg    EKG today demonstrates SR HR 61, PACs, NST, PR 160, QRS 98, QTc 467  Echo 10/14/18 demonstrated  -  Procedure narrative: Transthoracic echocardiography. Technically  difficult study. Intravenous contrast (Definity) was   administered. - Left ventricle: The cavity size was normal. There was moderate   concentric hypertrophy. Systolic function was normal. The   estimated ejection fraction was in the range of 60% to 65%. Wall   motion was normal; there were no regional wall motion   abnormalities. The study is not technically sufficient to allow   evaluation of LV diastolic function. - Left atrium: The atrium was normal in size. - Right atrium: The atrium was normal in size.  Epic records are reviewed at length today  Assessment and Plan:  1. Paroxysmal atrial fibrillation Patient AF burden low at 0.5% on last ILR interrogation. She had one brief episode. Patient appears to be maintaining SR. Continue Eliquis 5 mg BID Continue Toprol 100 mg daily Lifestyle modifications as below.  This patients CHA2DS2-VASc Score and unadjusted Ischemic Stroke Rate (% per year) is equal to 7.2 % stroke rate/year from a score of 5  Above score calculated as 1 point each if present [CHF, HTN, DM, Vascular=MI/PAD/Aortic Plaque, Age if 65-74, or Female] Above score calculated as 2 points each if present [Age > 75, or Stroke/TIA/TE]   2. Obesity Body mass index is 37.9 kg/m. Lifestyle modification was discussed and encouraged including regular physical activity and weight reduction.  3. OSA The importance of adequate treatment of sleep apnea was discussed today in order to improve our ability to maintain sinus rhythm long term. Patient compliant with BiPap therapy.  4. HTN Elevated today. Patient admits she had a salty meal this AM. Her readings at home have been in the 110s/70s. Will not make any changes today.  5. HFpEF No signs or symptoms of fluid overload.   Follow up with Dr Marlou Porch per recall. AF clinic in 4 months.   [facility] [address] [place], [place] [address] [phone] 11/12/2019 10:11 AM

## 2019-11-12 NOTE — Addendum Note (Signed)
Encounter addended by: Hinda Kehr, CMA on: 11/12/2019 10:41 AM  Actions taken: Order list changed

## 2019-11-18 ENCOUNTER — Ambulatory Visit (INDEPENDENT_AMBULATORY_CARE_PROVIDER_SITE_OTHER): Payer: Medicare Other | Admitting: *Deleted

## 2019-11-18 DIAGNOSIS — I6302 Cerebral infarction due to thrombosis of basilar artery: Secondary | ICD-10-CM | POA: Diagnosis not present

## 2019-11-18 LAB — CUP PACEART REMOTE DEVICE CHECK
Date Time Interrogation Session: 20201124134657
Implantable Pulse Generator Implant Date: 20191230

## 2019-11-24 ENCOUNTER — Encounter: Payer: Self-pay | Admitting: Adult Health

## 2019-12-09 ENCOUNTER — Other Ambulatory Visit: Payer: Self-pay | Admitting: Family Medicine

## 2019-12-09 DIAGNOSIS — R112 Nausea with vomiting, unspecified: Secondary | ICD-10-CM

## 2019-12-11 ENCOUNTER — Other Ambulatory Visit: Payer: Self-pay | Admitting: *Deleted

## 2019-12-11 DIAGNOSIS — R197 Diarrhea, unspecified: Secondary | ICD-10-CM

## 2019-12-11 MED ORDER — LOPERAMIDE HCL 2 MG PO CAPS: ORAL_CAPSULE | ORAL | 0 refills | Status: DC

## 2019-12-12 ENCOUNTER — Ambulatory Visit (INDEPENDENT_AMBULATORY_CARE_PROVIDER_SITE_OTHER): Payer: Medicare Other | Admitting: Podiatry

## 2019-12-12 ENCOUNTER — Other Ambulatory Visit: Payer: Self-pay

## 2019-12-12 ENCOUNTER — Encounter: Payer: Self-pay | Admitting: Podiatry

## 2019-12-12 VITALS — BP 183/97 | HR 60

## 2019-12-12 DIAGNOSIS — M79675 Pain in left toe(s): Secondary | ICD-10-CM | POA: Diagnosis not present

## 2019-12-12 DIAGNOSIS — G4733 Obstructive sleep apnea (adult) (pediatric): Secondary | ICD-10-CM | POA: Diagnosis not present

## 2019-12-12 DIAGNOSIS — E1159 Type 2 diabetes mellitus with other circulatory complications: Secondary | ICD-10-CM | POA: Diagnosis not present

## 2019-12-12 DIAGNOSIS — B351 Tinea unguium: Secondary | ICD-10-CM | POA: Diagnosis not present

## 2019-12-12 DIAGNOSIS — D689 Coagulation defect, unspecified: Secondary | ICD-10-CM

## 2019-12-12 DIAGNOSIS — E119 Type 2 diabetes mellitus without complications: Secondary | ICD-10-CM | POA: Insufficient documentation

## 2019-12-12 DIAGNOSIS — M79674 Pain in right toe(s): Secondary | ICD-10-CM

## 2019-12-12 NOTE — Progress Notes (Signed)
This patient presents to the office with chief complaint of long thick nails and diabetic feet.  This patient  says there  is  no pain and discomfort in her  feet.  This patient says there are long thick painful nails.  These nails are painful walking and wearing shoes.Patient was being seen at [facility]. For nail treatment previously.  Patient has no history of infection or drainage from both feet.  Patient is unable to  self treat his own nails . This patient presents  to the office today for treatment of the  long nails and a foot evaluation due to history of  Diabetes.  Patient is taking eliquiss.  General Appearance  Alert, conversant and in no acute stress.  Vascular  Dorsalis pedis and posterior tibial  pulses are palpable  bilaterally.  Capillary return is within normal limits  bilaterally. Temperature is within normal limits  bilaterally.  Neurologic  Senn-Weinstein monofilament wire test within normal limitdiminished   bilaterally. Muscle power within normal limits bilaterally.  Nails Thick disfigured discolored nails with subungual debris  from hallux to fifth toes bilaterally. No evidence of bacterial infection or drainage bilaterally.  Orthopedic  No limitations of motion of motion feet .  No crepitus or effusions noted.  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  Hammer toes 2-5  B/L.  Skin  normotropic skin with no porokeratosis noted bilaterally.  No signs of infections or ulcers noted.     Onychomycosis  Diabetes with no foot complications  IE  Debride nails x 10.  A diabetic foot exam was performed and there is no evidence of any vascular or neurologic pathology.   RTC 3 months.   Gardiner Barefoot DPM

## 2019-12-16 NOTE — Progress Notes (Signed)
ILR remote 

## 2019-12-19 ENCOUNTER — Other Ambulatory Visit: Payer: Self-pay | Admitting: Family Medicine

## 2019-12-22 ENCOUNTER — Ambulatory Visit (INDEPENDENT_AMBULATORY_CARE_PROVIDER_SITE_OTHER): Payer: Medicare Other | Admitting: *Deleted

## 2019-12-22 DIAGNOSIS — I6302 Cerebral infarction due to thrombosis of basilar artery: Secondary | ICD-10-CM

## 2019-12-22 DIAGNOSIS — I639 Cerebral infarction, unspecified: Secondary | ICD-10-CM | POA: Diagnosis not present

## 2019-12-22 LAB — CUP PACEART REMOTE DEVICE CHECK
Date Time Interrogation Session: 20201227134503
Implantable Pulse Generator Implant Date: 20191230

## 2019-12-25 ENCOUNTER — Encounter: Payer: Self-pay | Admitting: Family Medicine

## 2019-12-29 MED ORDER — FLUOXETINE HCL 20 MG PO TABS: 20.0000 mg | ORAL_TABLET | Freq: Every day | ORAL | 2 refills | Status: DC

## 2019-12-29 MED ORDER — PANTOPRAZOLE SODIUM 40 MG PO TBEC: 40.0000 mg | DELAYED_RELEASE_TABLET | Freq: Two times a day (BID) | ORAL | 0 refills | Status: DC

## 2019-12-29 MED ORDER — FLUOXETINE HCL 10 MG PO CAPS: 10.0000 mg | ORAL_CAPSULE | Freq: Every day | ORAL | 1 refills | Status: DC

## 2019-12-31 ENCOUNTER — Encounter: Payer: Self-pay | Admitting: Family Medicine

## 2019-12-31 NOTE — Telephone Encounter (Signed)
Patient calls the office regarding this message she sent. She says she now needs to use NovoLog because that is what her insurance now covers. She is a retired Pharmacist, hospital and had to [employer] to [place] and they only [employer]. She says she is just about out too and needs this as soon as possible. Please give patient a call with any questions.

## 2020-01-02 ENCOUNTER — Other Ambulatory Visit: Payer: Self-pay | Admitting: Family Medicine

## 2020-01-02 MED ORDER — INSULIN ASPART 100 UNIT/ML ~~LOC~~ SOLN: SUBCUTANEOUS | 0 refills | Status: DC

## 2020-01-02 MED ORDER — INSULIN PUMP: 1.0000 | SUBCUTANEOUS | 0 refills | Status: DC

## 2020-01-02 NOTE — Progress Notes (Signed)
Discussed with insulin plan with patient. Apparently her pump can handle both humalog and novolog. Will switch to novolog and sent in 76mL (3 vials) to pharmacy at patient request.  Guadalupe Dawn MD PGY-3 Family Medicine Resident

## 2020-01-02 NOTE — Telephone Encounter (Signed)
Patient calls nurse line stating she needs Novolog "NOW" its an emergency. Patient stated her insurance changed and they only cover Novolog.   Please send to [facility] on [address].

## 2020-01-06 ENCOUNTER — Encounter: Payer: Self-pay | Admitting: Family Medicine

## 2020-01-08 ENCOUNTER — Telehealth (INDEPENDENT_AMBULATORY_CARE_PROVIDER_SITE_OTHER): Payer: Medicare PPO | Admitting: Family Medicine

## 2020-01-08 ENCOUNTER — Other Ambulatory Visit: Payer: Self-pay

## 2020-01-08 DIAGNOSIS — I6302 Cerebral infarction due to thrombosis of basilar artery: Secondary | ICD-10-CM

## 2020-01-08 DIAGNOSIS — Z8673 Personal history of transient ischemic attack (TIA), and cerebral infarction without residual deficits: Secondary | ICD-10-CM

## 2020-01-08 DIAGNOSIS — R197 Diarrhea, unspecified: Secondary | ICD-10-CM

## 2020-01-08 DIAGNOSIS — R110 Nausea: Secondary | ICD-10-CM | POA: Diagnosis not present

## 2020-01-08 NOTE — Progress Notes (Signed)
[name] Telemedicine Visit  Patient consented to have virtual visit. Method of visit: Video was attempted, but technology challenges prevented patient from using video, so visit was conducted via telephone.  Encounter participants: Patient: [name] - located at home Provider: [name] Gaudencio Chesnut - located at [facility]  Others (if applicable): n/a  Chief Complaint: wheelchair and intermittent diarrhea/nausea  HPI:  [name] is calling in order to be evaluated for an electric wheelchair.  She states that the scooter has helped her tremendously in getting around the home.  However she is still had a few times where she has almost fallen.  She would like to be evaluated given her continued trouble with walking and ambulating around the home in order to do her daily activities of living.  Intermittent diarrhea, nausea Patient reports that her diarrhea and nausea have spaced out.  Her last episode was last week that only lasted 4 days.  She states that the Zofran helps with her nausea and that the Imodium does help with her diarrhea.  Patient is concerned that she may have gastroparesis due to her poorly controlled diabetes but she is working closely with her longtime endocrinologist in order to have better control of her diabetes and we will ask her regarding treatment for gastroparesis and if she believes that that might be what is going on.  Patient reports that she is no longer taking any stool softeners.  She does report that she takes prunes daily and she will stop these when she is having episodes of diarrhea.  She states that her symptoms are much more tolerable now.  She says overall she is doing well.  She denies any blood in her stool.  She denies any blood in her vomit.  She does states that with her last episode of nausea she did have one episode of vomiting. Patient has previously been on metoclopramide which did not really help symptoms.  ROS: per  HPI  Pertinent PMHx: T1DM on insulin pump, Atrial fibrillation, Previous multiple CVAs, Hypothyroidism, HFpEF, HTN, Chronic back pain, GERD, Depression/anxiety, HLD  Exam:  Respiratory: able to speak in complete sentences without issue  Assessment/Plan:  Nausea and diarrhea Unclear etiology. Patient is up-to-date on colonoscopy was last done in 08/2014 (repeat in 10 yr) and EGD performed 11/2018 with no significant findings. No reported weight loss. Patient reports that she is able to symptomatically control with Imodium and Zofran at this time.  She states that her episodes have become less frequent.  She will discuss possibility of treatment of gastroparesis with her endocrinologist with her upcoming appointment.  Patient advised to stop taking the prunes when she is having episodes of diarrhea.  No red flags.  Patient may need further evaluation for IBS given continued diarrhea.     Referral placed to neuro rehabilitation for patient to be evaluated for an electric wheelchair.  Time spent during visit with patient: 10 minutes  [name] Claretta Kendra, DO PGY-3, [place]

## 2020-01-09 ENCOUNTER — Encounter: Payer: Self-pay | Admitting: Family Medicine

## 2020-01-14 ENCOUNTER — Other Ambulatory Visit (HOSPITAL_COMMUNITY): Payer: Self-pay | Admitting: *Deleted

## 2020-01-14 MED ORDER — APIXABAN 5 MG PO TABS: 5.0000 mg | ORAL_TABLET | Freq: Two times a day (BID) | ORAL | 2 refills | Status: DC

## 2020-01-22 ENCOUNTER — Ambulatory Visit (INDEPENDENT_AMBULATORY_CARE_PROVIDER_SITE_OTHER): Payer: Medicare PPO | Admitting: *Deleted

## 2020-01-22 DIAGNOSIS — I6302 Cerebral infarction due to thrombosis of basilar artery: Secondary | ICD-10-CM | POA: Diagnosis not present

## 2020-01-22 LAB — CUP PACEART REMOTE DEVICE CHECK
Date Time Interrogation Session: 20210128022307
Implantable Pulse Generator Implant Date: 20191230

## 2020-01-22 NOTE — Progress Notes (Signed)
ILR Remote 

## 2020-01-29 ENCOUNTER — Telehealth: Payer: Self-pay | Admitting: Family Medicine

## 2020-01-29 NOTE — Telephone Encounter (Signed)
Form reviewed and there was important patient information missing on her scat form.  Asked Willette Alma to contact the niece and please have her come back and complete the form.  Oumou Smead,CMA

## 2020-01-29 NOTE — Telephone Encounter (Signed)
Patients niece came into office to drop off cardiovascular information for pt's PCP, patients last appointment was on 01-08-20, and forms were placed in blue team folder.

## 2020-01-30 ENCOUNTER — Other Ambulatory Visit: Payer: Self-pay | Admitting: *Deleted

## 2020-01-30 ENCOUNTER — Telehealth: Payer: Self-pay

## 2020-01-30 MED ORDER — INSULIN ASPART 100 UNIT/ML ~~LOC~~ SOLN: SUBCUTANEOUS | 0 refills | Status: DC

## 2020-01-30 NOTE — Telephone Encounter (Signed)
Pharmacy calls nurse line regarding clarification for maximum daily dose for insulin pump.   Please advise  To PCP  Talbot Grumbling, RN

## 2020-02-02 ENCOUNTER — Telehealth: Payer: Self-pay | Admitting: Neurology

## 2020-02-02 NOTE — Telephone Encounter (Signed)
Noted. Thanks.

## 2020-02-02 NOTE — Telephone Encounter (Signed)
FYI Pt has called to provide her new insurance information [employer] [id number]   BASE PLAN RX [id number] RX PCN#[id number] RX GROUP#[id number] CARD ISSUED 11-29-19

## 2020-02-03 ENCOUNTER — Telehealth (INDEPENDENT_AMBULATORY_CARE_PROVIDER_SITE_OTHER): Payer: Medicare PPO | Admitting: Adult Health

## 2020-02-03 ENCOUNTER — Encounter: Payer: Self-pay | Admitting: Adult Health

## 2020-02-03 DIAGNOSIS — G4730 Sleep apnea, unspecified: Secondary | ICD-10-CM | POA: Diagnosis not present

## 2020-02-03 DIAGNOSIS — I10 Essential (primary) hypertension: Secondary | ICD-10-CM

## 2020-02-03 DIAGNOSIS — I480 Paroxysmal atrial fibrillation: Secondary | ICD-10-CM | POA: Diagnosis not present

## 2020-02-03 DIAGNOSIS — E785 Hyperlipidemia, unspecified: Secondary | ICD-10-CM | POA: Diagnosis not present

## 2020-02-03 DIAGNOSIS — E1159 Type 2 diabetes mellitus with other circulatory complications: Secondary | ICD-10-CM

## 2020-02-03 DIAGNOSIS — Z794 Long term (current) use of insulin: Secondary | ICD-10-CM | POA: Diagnosis not present

## 2020-02-03 DIAGNOSIS — I6350 Cerebral infarction due to unspecified occlusion or stenosis of unspecified cerebral artery: Secondary | ICD-10-CM | POA: Diagnosis not present

## 2020-02-03 NOTE — Progress Notes (Signed)
[facility] [address] [place]. [place] [address] [phone]       OFFICE FOLLOW UP NOTE  [name] Date of Birth:  Jun 26, 1951 Medical Record Number:  [id number]   Reason for Referral: BiPAP follow-up   Virtual Visit via Video Note  I connected with [name] on 02/03/20 at  8:45 AM EST by a video enabled telemedicine application located remotely in my own home and verified that I am speaking with the correct person using two identifiers who was located at their own home.   I discussed the limitations of evaluation and management by telemedicine and the availability of in person appointments. The patient expressed understanding and agreed to proceed.   CHIEF COMPLAINT:  No chief complaint on file.   HPI:  Update via virtual visit 02/03/2020: [name] is a 69 year old female who is being seen today via virtual visit for stroke follow-up and initial BiPAP visit.    Stroke: She has been stable from a stroke standpoint with residual left hemiparesis which has been stable.  She continues to have difficulty from prior left humerus fracture which has been interfering with ambulation and use of rolling walker but she has been continuing to take short walks daily.  She uses electronic wheelchair for long distance transportation.  Continues on Eliquis and atorvastatin for secondary stroke prevention without side effects.  Blood pressure and glucose levels monitored at home which have been stable.  Sleep apnea: At prior visit, adequate compliance of CPAP but continued to have elevated AHI at 29.9.  She underwent titration study on 10/22/2019 due to inadequate treatment of sleep apnea on AutoPap and CPAP which showed significant improvement of sleep apnea with use of BiPAP.  She initiated BiPAP usage on 11/09/2019.  Compliance report 01/03/2020 -02/01/2020 shows 30 out of 30 usage days with 10 days greater than 4 hours usage.  Average usage  3 hours and 33 minutes.  Residual AHI 25.8.  Leaks in the 95th percentile 7 L/min. IPAP 22 and EPAP 16.  She states initially had difficulty tolerating and would have to take it off after approximately 4 hours because it would start to make "noises" and have increased leaking around the mask.  She did speak to her DME company who advised her the need of readjusting when this occurs which has helped decrease leakage.  She does report waking up frequently throughout the night having to void and at times will forget to place her mask back on.  She does believe overall she feels better during the day and sleeping better with use of BiPAP.  Her sister assist with cleaning the machine daily.  No further concerns at this time.    Update via virtual visit 09/29/2019: [name] is a 69 year old female who is being seen today through telemedicine via MyChart for stroke follow-up and initial CPAP compliance visit.  Stroke: Residual stroke deficits of left hemiparesis.  She was recently evaluated by her PCP for request of electric wheelchair due to increased difficulty with ambulation with long distance resulting in frequent falls. She has since received an electric w/c for long distance. She was evaluated in the ED on 07/06/2019 due to a fall resulting in left humerus fracture.  She does endorse improvement of function. Continues to follow with orthopedics. Continues to ambulate with RW without any recent falls. Continues on Eliquis without bleeding or bruising. Continues on atorvastatin without myalgias.  Blood pressure monitored at home which has  been stable.  No further concerns at this time.  Sleep apnea on CPAP: She underwent split-night study on 05/26/2019 which showed severe obstructive sleep apnea with AHI 78.4/h and recommended initiation of AutoPap for management.  Unable to undergo CPAP titration studies at the time of diagnosis due to pandemic restrictions.  Initial compliance report from 08/26/2019- 09/24/2019  shows 30 out of 30 usage days for 100% compliance and 28 days greater than 4 hours for 93% compliance.  Average usage 6 hours and 38 minutes.  Residual AHI 29.9.  Apnea index central 18.9, obstructive 7.4 and unknown 0.5.  Leaks in the 95th percentile 11.4 L/min.  Pressure in the 95th percentile 14.0 cm H2O with a min pressure 7 cm H2O and max pressure 15 cm H2O with EPR level 2.  She has been tolerating well with improvement of her sleep. She uses [employer] daily to clean her CPAP. Continues to follow with [employer] for supplies needed.  No concerns regarding her machine.    Update 02/19/2019: [name] is a 69 year old female who returns today for follow-up after right pontine infarct in 09/2018.  She unfortunately sustained a couple falls and was evaluated in the ED on 12/13/2018 for right knee pain.  Per review of the ED notes, she was fearful of returning home due to safety concerns and was requesting to be placed at rehab facility therefore she was discharged to [facility] for ongoing therapy.  She has been since discharged home.  Unfortunately due to lack of transportation, she was unable to participate in therapies prior to 12/13/2018 admission.  As she has since obtained SCAT assistance, she is currently participating in physical therapy at neuro rehab for ongoing deficits and overall feeling of being weaker leading to increased difficulty in walking. She currently lives with her sister who works full time leaving patient at home during the day. She is able to maintain ADL's without assistance and able to cook and clean without assistance. She is currently using rollator walker with a recent fall on Sunday stating her feet "got twisted". She did attempt 30 day cardiac monitor but due to allergic skin reaction, unable to fully complete. she underwent loop recorder placement on 12/23/2018 which has shown atrial fibrillation on 02/09/2019 and has appointment to follow-up in AF clinic today for discussion  of initiating [place].  She continues on Plavix at this time without side effects of bleeding or bruising.  Continues on atorvastatin without side effects myalgias.  Blood pressure today initially elevated but upon manual recheck 148/90.  She continues to monitor glucose levels at home and have been ranging in the low 100s.  Denies new or worsening stroke/TIA symptoms.    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  She was previously followed in this office Dr. Erlinda Hong for prior stroke.  History obtained from patient and chart review. Reviewed all radiology images and labs personally.  [name] is a 69 y.o. female with history of HTN, HLD, prior stroke, CHF, type I DB, thyroid cancer presenting with L hemiparesis and L sided numbness.  CT head reviewed and was negative for acute infarct but did show old left pontine lacune infarct.  CTA head and neck stable since imaging in 2016 which showed high-grade stenosis bilateral ICA segments and bilateral distal left VA V4.  MRI brain reviewed and showed right pontine infarct  along with small vessel disease and old pontine lacunar infarcts.  2D echo showed an EF of 60 to 65% without evidence of cardiac thrombus but did show atrial fibrillation.  As there is no clear evidence on telemetry monitor of atrial fibrillation, TEE performed which was unremarkable without evidence of thrombus or PFO.  Recommended to undergo 30-day event cardiac monitor to rule out atrial fibrillation as during 2D echo showed possible AF.  Recommended a 30-day cardiac event monitor does not demonstrate AF, loop recorder will be considered.  Patient was on Plavix PTA and recommended DAPT for 3 weeks and then single agent alone.  LDL 48 and recommended continuation of atorvastatin 40 mg daily.  A1c 9.8 and recommended tight glycemic control with  close PCP follow-up for DM management.  HTN stable during admission recommended long-term goal normotensive range.  Therapy recommended home health PT/OT and was discharged in stable condition.  Patient is being seen today for hospital follow-up.  She did have cardiac monitor started on 10/28/2018 and will be completed on 11/27/2018. She continues to have left sided weakness with some improvement. She does endorse numbness and heaviness sensation in left leg. She does endorse muscle stiffness in left arm and neck but denies pain. She has recently completed home health PT/OT. She would like to continue outpatient therapy at our neuro rehab. She continues to use rollator walker when she is outside which she was using prior. She continues on asprin and plavix without side effects of bleeding or bruising. Recent episode of GERD symptoms and was told this could possibly be due to use of aspirin. Continues to take lipitor without side effects of myalgias. Blood pressure satisfactory 142/71.  No further concerns at this time.  Denies new or worsening stroke/TIA symptoms. On a side note, she does complain of left toe pain and swelling which per review of epic notes, she has reached out to her PCP in this regards and is recommended to be seen in office as this could be possibly related to gout or a different type of condition that may need to be worked up.  She states she has a difficult time scheduling too many appointments soon together as she is unable to drive and does not want her family members missing too much work.  She does have an appointment with endocrinology on Monday and after that we will be being seen by cardiology for completion of 30-day cardiac monitor and then plans on scheduling appointment with PCP.    ROS:   14 system review of systems performed and negative with exception of see HPI  PMH:  Past Medical History:  Diagnosis Date  . Abnormal echocardiogram   . Anemia   . Arthritis   . CHF  (congestive heart failure) ([place])   . Closed fracture of left proximal humerus 07/08/2019  . Depression   . GERD (gastroesophageal reflux disease)   . Hemorrhoids   . Hyperlipidemia   . Hypertension   . Hypothyroidism   . Seasonal allergies   . Stroke ([place])   . Thyroid cancer ([place])    per pt S/p Total Thyroidectomy with Radioactive Iodine Therapy  . Type 1 diabetes mellitus ([facility])     PSH:  Past Surgical History:  Procedure Laterality Date  . ABDOMINAL ANGIOGRAM N/A 05/14/2012   Procedure: ABDOMINAL ANGIOGRAM;  Surgeon: Laverda Page, MD;  Location: [facility] CATH LAB;  Service: Cardiovascular;  Laterality: N/A;  . ABDOMINAL HYSTERECTOMY  1995   partial  . ANKLE FRACTURE  SURGERY Right   . CARDIAC CATHETERIZATION     with coronary angiogram  . COLONOSCOPY N/A 09/09/2014   Procedure: COLONOSCOPY;  Surgeon: Gatha Mayer, MD;  Location: [place];  Service: Endoscopy;  Laterality: N/A;  . ESOPHAGOGASTRODUODENOSCOPY (EGD) WITH PROPOFOL N/A 12/03/2018   Procedure: ESOPHAGOGASTRODUODENOSCOPY (EGD) WITH PROPOFOL;  Surgeon: Doran Stabler, MD;  Location: [place] [facility];  Service: Gastroenterology;  Laterality: N/A;  . LEFT AND RIGHT HEART CATHETERIZATION WITH CORONARY ANGIOGRAM N/A 05/14/2012   Procedure: LEFT AND RIGHT HEART CATHETERIZATION WITH CORONARY ANGIOGRAM;  Surgeon: Laverda Page, MD;  Location: [facility] CATH LAB;  Service: Cardiovascular;  Laterality: N/A;  . LOOP RECORDER INSERTION N/A 12/23/2018   Procedure: LOOP RECORDER INSERTION;  Surgeon: Constance Haw, MD;  Location: [place] CV LAB;  Service: Cardiovascular;  Laterality: N/A;  . MINI METER   09/08/2014   ELECTRONIC INSULIN PUMP  . TEE WITHOUT CARDIOVERSION N/A 10/16/2018   Procedure: TRANSESOPHAGEAL ECHOCARDIOGRAM (TEE);  Surgeon: Buford Dresser, MD;  Location: [facility] ENDOSCOPY;  Service: Cardiovascular;  Laterality: N/A;  . THYROIDECTOMY  2006    Social History:  Social History   Socioeconomic History    . Marital status: Divorced    Spouse name: Not on file  . Number of children: 1  . Years of education: Not on file  . Highest education level: Not on file  Occupational History  . Occupation: retired Pharmacist, hospital  Tobacco Use  . Smoking status: Never Smoker  . Smokeless tobacco: Never Used  Substance and Sexual Activity  . Alcohol use: No  . Drug use: No  . Sexual activity: Not on file  Other Topics Concern  . Not on file  Social History Narrative   Retired Pharmacist, hospital, high school teacher taught mass. One son, helping to care for her granddaughter. No caffeine. Updated 07/20/2014.   Social Determinants of Health   Financial Resource Strain:   . Difficulty of Paying Living Expenses: Not on file  Food Insecurity:   . Worried About Charity fundraiser in the Last Year: Not on file  . Ran Out of Food in the Last Year: Not on file  Transportation Needs:   . Lack of Transportation (Medical): Not on file  . Lack of Transportation (Non-Medical): Not on file  Physical Activity:   . Days of Exercise per Week: Not on file  . Minutes of Exercise per Session: Not on file  Stress:   . Feeling of Stress : Not on file  Social Connections:   . Frequency of Communication with Friends and Family: Not on file  . Frequency of Social Gatherings with Friends and Family: Not on file  . Attends Religious Services: Not on file  . Active Member of Clubs or Organizations: Not on file  . Attends Archivist Meetings: Not on file  . Marital Status: Not on file  Intimate Partner Violence:   . Fear of Current or Ex-Partner: Not on file  . Emotionally Abused: Not on file  . Physically Abused: Not on file  . Sexually Abused: Not on file    Family History:  Family History  Problem Relation Age of Onset  . Heart disease Mother   . Hyperlipidemia Mother   . Hypertension Mother   . Renal Disease Mother        insufficiency  . Heart attack Mother        in his 93's  . Early death Father         Head  Injury at Work  . Hyperlipidemia Father   . Hypertension Father   . Stroke Son   . Diabetes Maternal Aunt   . Prostate cancer Paternal Uncle   . Heart disease Maternal Aunt   . Heart failure Maternal Grandfather   . Heart disease Maternal Grandfather   . Heart attack Maternal Grandfather        Died in his 16's  . Breast cancer Neg Hx     Medications:   Current Outpatient Medications on File Prior to Visit  Medication Sig Dispense Refill  . acetaminophen (TYLENOL) 500 MG tablet Take 1,000 mg by mouth as needed for mild pain.    Marland Kitchen apixaban (ELIQUIS) 5 MG TABS tablet Take 1 tablet (5 mg total) by mouth 2 (two) times daily. 180 tablet 2  . atorvastatin (LIPITOR) 80 MG tablet Take 1 tablet (80 mg total) by mouth daily. 90 tablet 2  . CRANBERRY EXTRACT PO Take 1 capsule by mouth daily.    . fexofenadine (ALLEGRA) 30 MG tablet Take 30 mg by mouth daily as needed (allergies).     Marland Kitchen FLUoxetine (PROZAC) 10 MG capsule Take 1 capsule (10 mg total) by mouth daily. 90 capsule 1  . FLUoxetine (PROZAC) 20 MG tablet Take 1 tablet (20 mg total) by mouth daily. Take with 10mg  for 30mg  total. 90 tablet 2  . gabapentin (NEURONTIN) 300 MG capsule TAKE ONE CAPSULE BY MOUTH AT BEDTIME 90 capsule 3  . HYDROcodone-acetaminophen (NORCO/VICODIN) 5-325 MG tablet Take 2 tablets by mouth every 4 (four) hours as needed. 20 tablet 0  . insulin aspart (NOVOLOG) 100 UNIT/ML injection To be used in patient's insulin pump 30 mL 0  . Insulin Human (INSULIN PUMP) SOLN Inject 1 each into the skin continuous. Novolog insulin 1 each 0  . levothyroxine (SYNTHROID, LEVOTHROID) 175 MCG tablet Take 175 mcg by mouth daily before breakfast. Every day but Sunday  12  . loperamide (IMODIUM) 2 MG capsule TAKE 1 CAPSULE( 2 MG TOTAL) BY MOUTH FOUR TIMES DAILY AS NEEDED FOR DIARRHEA OR LOOSE STOOLS 30 capsule 0  . losartan (COZAAR) 25 MG tablet Take 1 tablet (25 mg total) by mouth at bedtime. 90 tablet 3  . metoprolol succinate  (TOPROL-XL) 100 MG 24 hr tablet Take 1 tablet (100 mg total) by mouth daily. Take with or immediately following a meal. 90 tablet 1  . ondansetron (ZOFRAN-ODT) 4 MG disintegrating tablet DISSOLVE 1 TABLET BY MOUTH EVERY 8 HOURS AS NEEDED FOR NAUSEA AND VOMITING 20 tablet 0  . oxyCODONE (OXY IR/ROXICODONE) 5 MG immediate release tablet Take 1 tablet (5 mg total) by mouth every 8 (eight) hours as needed for severe pain. 30 tablet 0  . pantoprazole (PROTONIX) 40 MG tablet Take 1 tablet (40 mg total) by mouth 2 (two) times daily before a meal. 180 tablet 0  . potassium chloride (K-DUR) 10 MEQ tablet Take 1 tablet by mouth on days toresmide is taken 30 tablet 2  . torsemide (DEMADEX) 10 MG tablet TAKE 1 TABLET BY MOUTH DAILY AS NEEDED FOR FLUID (Patient taking differently: Take 10 mg by mouth daily as needed (fluid). ) 90 tablet 0  . [DISCONTINUED] ondansetron (ZOFRAN-ODT) 4 MG disintegrating tablet DISSOLVE ONE TABLET BY MOUTH EVERY 8 HOURS AS NEEDED FOR NAUSEA AND VOMITING 20 tablet 0  . [DISCONTINUED] pantoprazole (PROTONIX) 40 MG tablet Take 1 tablet (40 mg total) by mouth daily. 90 tablet 0   No current facility-administered medications on file prior to visit.  Allergies:   Allergies  Allergen Reactions  . Latex Rash     Physical Exam  General: well developed, well nourished, pleasant middle-aged African-American female, seated, in no evident distress Head: head normocephalic and atraumatic.    Neurologic Exam Mental Status: Awake and fully alert.  Normal speech and language.  Oriented to place and time. Recent and remote memory intact. Attention span, concentration and fund of knowledge appropriate. Mood and affect appropriate.  Cranial Nerves: Extraocular movements full without nystagmus. Hearing intact to voice. Facial sensation intact. Face, tongue, palate moves normally and symmetrically.  Shoulder shrug symmetric. Motor: No evidence of large muscle weakness per drift assessment;  slightly decreased left hand dexterity Sensory.: intact to light touch Coordination: Rapid alternating movements normal in all extremities except slightly decreased left hand. Finger-to-nose and heel-to-shin performed accurately bilaterally. Gait and Station: Deferred due to visit type and limited assistance for patient during visit Reflexes: UTA      Diagnostic Data (Labs, Imaging, Testing)  CT HEAD WO CONTRAST 10/13/2018 IMPRESSION: 1. No acute finding.  ASPECTS is 10. 2. 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  CT ANGIO HEAD W OR WO CONTRAST CT ANGIO NECK W OR WO CONTRAST 10/14/2018 IMPRESSION: 1. 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 There is comparatively little extracranial atherosclerosis, and no plaque or stenosis identified in the circle of Willis branches. 2. Stable CT appearance of the brain since yesterday. No acute or evolving infarct identified.  MR BRAIN WO CONTRAST 10/14/2018 IMPRESSION: 1. Motion degraded examination. Acute subcentimeter RIGHT pontine infarct. 2. 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  ECHOCARDIOGRAM 10/14/2018 Impressions: - Technically difficult study. Afib is noted. LVEF 60-65%, moderate   LVH, normal wall motion, normal biatrial size.  Echo TEE 10/16/2018 Study Conclusions - Left ventricle: Systolic function was normal. The estimated   ejection fraction was in the range of 60% to 65%. Wall motion was   normal; there were no regional wall motion abnormalities. - Aortic valve: Trileaflet; mildly thickened, mildly calcified   leaflets. There was no significant regurgitation. - Mitral valve: There was trivial regurgitation. - Left atrium: No evidence of thrombus in the atrial cavity or   appendage. - Right atrium: No evidence of thrombus in the atrial  cavity or   appendage. - Atrial septum: No defect or patent foramen ovale was identified.   Echo contrast study showed a trivial late right-to-left shunt, in   the baseline state. This likely represents non-cardiac shunt. - Tricuspid valve: There was mild regurgitation.   ASSESSMENT: [name] is a 69 y.o. year old female here with right pontine infarct on 10/13/2018 secondary to small vessel disease. Vascular risk factors include HTN, HLD, prior stroke, CHF, DM and thyroid cancer.  She had ILR placed due to frequent atrial ectopy on telemetry and showed atrial fibrillation on 02/09/2019 and initiated Eliquis.  Residual deficits of mild decrease left hand dexterity and balance difficulties.  Recent sleep study on 05/26/2019 showed severe sleep apnea and CPAP initiated.  At prior visit in 09/2019, adequate compliance of CPAP but due to continued elevated AHI, she underwent titration study on 10/22/2019 which showed great improvement of AHI on use of BiPAP.  She initiated BiPAP on 11/09/2019.    PLAN:  1. Right pontine infarct: Continue Eliquis (apixaban) daily  and atorvastatin 80 mg for  secondary stroke prevention.  Maintain strict control of hypertension with blood pressure goal below 130/90, diabetes with hemoglobin A1c goal below 6.5% and cholesterol with LDL cholesterol (bad cholesterol) goal below 70 mg/dL.  I also advised the patient to eat a healthy diet with plenty of whole grains, cereals, fruits and vegetables, exercise regularly with at least 30 minutes of continuous activity daily and maintain ideal body weight. 2. Severe sleep apnea: Recently switched to BiPAP due to inadequate management with use of AutoPap or CPAP.  Residual elevated AHI at 25.8 but only 10 out of 30 usage days were greater than 4 hours.  Discussion regarding importance of using mask for greater than 4 hours nightly and to ensure she replaces the mask after having to get up to go to the bathroom frequently  throughout the night.  She verbalized understanding and will ensure to place his mask back on.  Advised to call office in 1 month for reeval of compliance report.  She is aware to continue to follow with DME company for any questions, concerns or need of BiPAP supplies. 3. HTN: Advised to continue current treatment regimen.  Advised to continue to monitor at home along with continued follow-up with PCP for management 4. HLD: Advised to continue current treatment regimen along with continued follow-up with PCP for future prescribing and monitoring of lipid panel 5. DMII: Advised to continue to monitor glucose levels at home along with continued follow-up with PCP for management and monitoring 6. Atrial fibrillation: Continuation of Eliquis and ongoing follow-up with cardiology for monitoring and management   Follow-up in 3 months or call earlier if needed   Greater than 50% of time during this 30 minute non-face-to-face visit was spent on counseling, discussion regarding new use of BiPAP with importance of increasing usage greater than 4 hours, reviewed prior history of right pontine infarct and importance of risk factor management of A. fib, HTN, HLD, prior infarct, CHF, sleep apnea on BiPAP and DM,  discussion regarding fall precautions especially with use of [place], planning of further management along with potential future management, and discussion with patient and answering all questions to patient satisfaction    Frann Rider, AGNP-BC  [facility] [address] [place], [place] [address]  Phone [phone] Fax [phone] Note: This document was prepared with digital dictation and possible smart phrase technology. Any transcriptional errors that result from this process are unintentional.  I reviewed the above note and documentation by the Nurse Practitioner and agree with the history, exam, assessment and plan as outlined above. I was available for  consultation. Star Age, MD, PhD [facility])

## 2020-02-03 NOTE — Progress Notes (Signed)
I agree with the above plan 

## 2020-02-04 ENCOUNTER — Telehealth: Payer: Self-pay | Admitting: Family Medicine

## 2020-02-04 NOTE — Telephone Encounter (Signed)
Patient's niece came into office to drop off GTA (scat) forms to be completed by her PCP, pt had an telemedicine apt on 01-14, and forms were placed in blue team folder.

## 2020-02-04 NOTE — Telephone Encounter (Signed)
Clinical info completed on SCAT form.  Place form in Dr. Bonnita Levan box for completion.  Alpha Chouinard, CMA

## 2020-02-05 NOTE — Telephone Encounter (Signed)
Cover sheet indicated to mail form back to patient.  Done as requested.    Copy placed in batch scanning.  Christen Bame, CMA

## 2020-02-10 DIAGNOSIS — G4733 Obstructive sleep apnea (adult) (pediatric): Secondary | ICD-10-CM | POA: Diagnosis not present

## 2020-02-16 ENCOUNTER — Encounter: Payer: Self-pay | Admitting: Family Medicine

## 2020-02-18 DIAGNOSIS — E890 Postprocedural hypothyroidism: Secondary | ICD-10-CM | POA: Diagnosis not present

## 2020-02-18 DIAGNOSIS — Z9641 Presence of insulin pump (external) (internal): Secondary | ICD-10-CM | POA: Diagnosis not present

## 2020-02-18 DIAGNOSIS — E11319 Type 2 diabetes mellitus with unspecified diabetic retinopathy without macular edema: Secondary | ICD-10-CM | POA: Diagnosis not present

## 2020-02-18 DIAGNOSIS — E109 Type 1 diabetes mellitus without complications: Secondary | ICD-10-CM | POA: Diagnosis not present

## 2020-02-18 DIAGNOSIS — I639 Cerebral infarction, unspecified: Secondary | ICD-10-CM | POA: Diagnosis not present

## 2020-02-18 DIAGNOSIS — I10 Essential (primary) hypertension: Secondary | ICD-10-CM | POA: Diagnosis not present

## 2020-02-18 DIAGNOSIS — E7800 Pure hypercholesterolemia, unspecified: Secondary | ICD-10-CM | POA: Diagnosis not present

## 2020-02-18 DIAGNOSIS — C73 Malignant neoplasm of thyroid gland: Secondary | ICD-10-CM | POA: Diagnosis not present

## 2020-02-18 DIAGNOSIS — Z794 Long term (current) use of insulin: Secondary | ICD-10-CM | POA: Diagnosis not present

## 2020-02-19 ENCOUNTER — Other Ambulatory Visit: Payer: Self-pay | Admitting: Family Medicine

## 2020-02-20 DIAGNOSIS — E1065 Type 1 diabetes mellitus with hyperglycemia: Secondary | ICD-10-CM | POA: Diagnosis not present

## 2020-02-22 ENCOUNTER — Ambulatory Visit (INDEPENDENT_AMBULATORY_CARE_PROVIDER_SITE_OTHER): Payer: Medicare PPO | Admitting: *Deleted

## 2020-02-22 DIAGNOSIS — I4891 Unspecified atrial fibrillation: Secondary | ICD-10-CM

## 2020-02-22 LAB — CUP PACEART REMOTE DEVICE CHECK
Date Time Interrogation Session: 20210228022143
Implantable Pulse Generator Implant Date: 20191230

## 2020-02-22 NOTE — Progress Notes (Signed)
ILR remote 

## 2020-02-23 ENCOUNTER — Telehealth (INDEPENDENT_AMBULATORY_CARE_PROVIDER_SITE_OTHER): Payer: Medicare PPO | Admitting: Family Medicine

## 2020-02-23 ENCOUNTER — Encounter: Payer: Self-pay | Admitting: Family Medicine

## 2020-02-23 ENCOUNTER — Other Ambulatory Visit: Payer: Self-pay

## 2020-02-23 DIAGNOSIS — G4733 Obstructive sleep apnea (adult) (pediatric): Secondary | ICD-10-CM | POA: Diagnosis not present

## 2020-02-23 NOTE — Progress Notes (Signed)
Attempted to reach pt for reminder call. No answer. LVM. Salvatore Marvel, CMA

## 2020-02-23 NOTE — Progress Notes (Signed)
[facility] Visit  Attempted to call patient at [phone] during regular scheduled appointment and waited in [place] virtual room with no response. Then attempted to call patient x2 after appointment. No voicemail set up.   [name] Ladeidra Borys, DO PGY-3, [facility]

## 2020-02-24 DIAGNOSIS — G4733 Obstructive sleep apnea (adult) (pediatric): Secondary | ICD-10-CM | POA: Diagnosis not present

## 2020-02-26 ENCOUNTER — Telehealth: Payer: Self-pay | Admitting: *Deleted

## 2020-02-26 NOTE — Telephone Encounter (Signed)
[name] called and states patient doesn't need to have an evaluation with neuro rehab if she is able to stand and reposition herself in a chair.  If she is not able to relieve pressure off her bottom on her own than this would require a more detailed evaluation.  Patient does need an updated "face to face" visit but virtual will suffice if patient is unable to come into the office.  If we can let [name] know when the appointment is, then she will guide [place] on what to look for specifically during this exam.  Will forward to MD since she has been communicating with patient on mychart.  [name],CMA

## 2020-03-01 ENCOUNTER — Encounter: Payer: Self-pay | Admitting: Family Medicine

## 2020-03-02 NOTE — Telephone Encounter (Signed)
Patient has an appointment next week for her face to face.  I have also sent a community message to Andria Rhein with [employer], so we can make sure to word and document correctly for this visit.  Anyla Israelson,CMA

## 2020-03-05 ENCOUNTER — Other Ambulatory Visit: Payer: Self-pay

## 2020-03-05 ENCOUNTER — Ambulatory Visit: Payer: Medicare Other | Admitting: Podiatry

## 2020-03-05 ENCOUNTER — Encounter: Payer: Self-pay | Admitting: Podiatry

## 2020-03-05 VITALS — Temp 95.7°F

## 2020-03-05 DIAGNOSIS — M79674 Pain in right toe(s): Secondary | ICD-10-CM

## 2020-03-05 DIAGNOSIS — D689 Coagulation defect, unspecified: Secondary | ICD-10-CM | POA: Diagnosis not present

## 2020-03-05 DIAGNOSIS — M79675 Pain in left toe(s): Secondary | ICD-10-CM | POA: Diagnosis not present

## 2020-03-05 DIAGNOSIS — B351 Tinea unguium: Secondary | ICD-10-CM | POA: Diagnosis not present

## 2020-03-05 DIAGNOSIS — E1159 Type 2 diabetes mellitus with other circulatory complications: Secondary | ICD-10-CM

## 2020-03-05 NOTE — Progress Notes (Signed)
This patient returns to my office for at risk foot care.  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  This patient is unable to cut nails herself  since the patient cannot reach her  nails.These nails are painful walking and wearing shoes.  This patient presents for at risk foot care today.  General Appearance  Alert, conversant and in no acute stress.  Vascular  Dorsalis pedis and posterior tibial  pulses are weakly  palpable  bilaterally.  Capillary return is within normal limits  bilaterally. Temperature is within normal limits  bilaterally.  Neurologic  Senn-Weinstein monofilament wire diminished  bilaterally. Muscle power within normal limits bilaterally.  Nails Thick disfigured discolored nails with subungual debris  from hallux to fifth toes bilaterally. No evidence of bacterial infection or drainage bilaterally.  Orthopedic  No limitations of motion  feet .  No crepitus or effusions noted.  No bony pathology or digital deformities noted.  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  Hammer toes 2-5  B/L.  Skin  normotropic skin with no porokeratosis noted bilaterally.  No signs of infections or ulcers noted.     Onychomycosis  Pain in right toes  Pain in left toes  Consent was obtained for treatment procedures.   Mechanical debridement of nails 1-5  bilaterally performed with a nail nipper.  Filed with dremel without incident. No infection or ulcer.     Return office visit    3 months       Told patient to return for periodic foot care and evaluation due to potential at risk complications.   Gardiner Barefoot DPM

## 2020-03-07 ENCOUNTER — Other Ambulatory Visit: Payer: Self-pay | Admitting: Family Medicine

## 2020-03-07 ENCOUNTER — Inpatient Hospital Stay (HOSPITAL_COMMUNITY)
Admission: EM | Admit: 2020-03-07 | Discharge: 2020-03-14 | DRG: 309 | Disposition: A | Discharge status: Skilled Nursing Facility | Payer: Medicare PPO | Attending: Family Medicine | Admitting: Family Medicine

## 2020-03-07 ENCOUNTER — Emergency Department (HOSPITAL_COMMUNITY): Payer: Medicare PPO

## 2020-03-07 ENCOUNTER — Other Ambulatory Visit: Payer: Self-pay

## 2020-03-07 ENCOUNTER — Inpatient Hospital Stay (HOSPITAL_COMMUNITY): Payer: Medicare PPO

## 2020-03-07 DIAGNOSIS — I340 Nonrheumatic mitral (valve) insufficiency: Secondary | ICD-10-CM | POA: Diagnosis not present

## 2020-03-07 DIAGNOSIS — I4430 Unspecified atrioventricular block: Secondary | ICD-10-CM | POA: Diagnosis not present

## 2020-03-07 DIAGNOSIS — D6859 Other primary thrombophilia: Secondary | ICD-10-CM | POA: Diagnosis not present

## 2020-03-07 DIAGNOSIS — I69354 Hemiplegia and hemiparesis following cerebral infarction affecting left non-dominant side: Secondary | ICD-10-CM

## 2020-03-07 DIAGNOSIS — R0902 Hypoxemia: Secondary | ICD-10-CM | POA: Diagnosis not present

## 2020-03-07 DIAGNOSIS — E1042 Type 1 diabetes mellitus with diabetic polyneuropathy: Secondary | ICD-10-CM | POA: Diagnosis present

## 2020-03-07 DIAGNOSIS — I5030 Unspecified diastolic (congestive) heart failure: Secondary | ICD-10-CM | POA: Diagnosis not present

## 2020-03-07 DIAGNOSIS — Z20822 Contact with and (suspected) exposure to covid-19: Secondary | ICD-10-CM | POA: Diagnosis not present

## 2020-03-07 DIAGNOSIS — F29 Unspecified psychosis not due to a substance or known physiological condition: Secondary | ICD-10-CM | POA: Diagnosis not present

## 2020-03-07 DIAGNOSIS — D649 Anemia, unspecified: Secondary | ICD-10-CM | POA: Diagnosis present

## 2020-03-07 DIAGNOSIS — E1069 Type 1 diabetes mellitus with other specified complication: Secondary | ICD-10-CM | POA: Diagnosis present

## 2020-03-07 DIAGNOSIS — I491 Atrial premature depolarization: Secondary | ICD-10-CM | POA: Diagnosis not present

## 2020-03-07 DIAGNOSIS — E890 Postprocedural hypothyroidism: Secondary | ICD-10-CM | POA: Diagnosis present

## 2020-03-07 DIAGNOSIS — Z823 Family history of stroke: Secondary | ICD-10-CM

## 2020-03-07 DIAGNOSIS — R7402 Elevation of levels of lactic acid dehydrogenase (LDH): Secondary | ICD-10-CM | POA: Diagnosis present

## 2020-03-07 DIAGNOSIS — E1065 Type 1 diabetes mellitus with hyperglycemia: Secondary | ICD-10-CM | POA: Diagnosis present

## 2020-03-07 DIAGNOSIS — E782 Mixed hyperlipidemia: Secondary | ICD-10-CM | POA: Diagnosis present

## 2020-03-07 DIAGNOSIS — K219 Gastro-esophageal reflux disease without esophagitis: Secondary | ICD-10-CM | POA: Diagnosis not present

## 2020-03-07 DIAGNOSIS — I442 Atrioventricular block, complete: Secondary | ICD-10-CM | POA: Diagnosis present

## 2020-03-07 DIAGNOSIS — Z79899 Other long term (current) drug therapy: Secondary | ICD-10-CM

## 2020-03-07 DIAGNOSIS — E1059 Type 1 diabetes mellitus with other circulatory complications: Secondary | ICD-10-CM | POA: Diagnosis not present

## 2020-03-07 DIAGNOSIS — Z79890 Hormone replacement therapy (postmenopausal): Secondary | ICD-10-CM

## 2020-03-07 DIAGNOSIS — R55 Syncope and collapse: Secondary | ICD-10-CM | POA: Diagnosis present

## 2020-03-07 DIAGNOSIS — E876 Hypokalemia: Secondary | ICD-10-CM | POA: Diagnosis present

## 2020-03-07 DIAGNOSIS — I152 Hypertension secondary to endocrine disorders: Secondary | ICD-10-CM | POA: Diagnosis present

## 2020-03-07 DIAGNOSIS — T7840XA Allergy, unspecified, initial encounter: Secondary | ICD-10-CM | POA: Diagnosis not present

## 2020-03-07 DIAGNOSIS — W19XXXA Unspecified fall, initial encounter: Secondary | ICD-10-CM | POA: Diagnosis present

## 2020-03-07 DIAGNOSIS — Z9641 Presence of insulin pump (external) (internal): Secondary | ICD-10-CM | POA: Diagnosis present

## 2020-03-07 DIAGNOSIS — R0789 Other chest pain: Secondary | ICD-10-CM | POA: Diagnosis not present

## 2020-03-07 DIAGNOSIS — E669 Obesity, unspecified: Secondary | ICD-10-CM | POA: Diagnosis present

## 2020-03-07 DIAGNOSIS — F419 Anxiety disorder, unspecified: Secondary | ICD-10-CM | POA: Diagnosis present

## 2020-03-07 DIAGNOSIS — Z743 Need for continuous supervision: Secondary | ICD-10-CM | POA: Diagnosis not present

## 2020-03-07 DIAGNOSIS — Z794 Long term (current) use of insulin: Secondary | ICD-10-CM

## 2020-03-07 DIAGNOSIS — I441 Atrioventricular block, second degree: Secondary | ICD-10-CM | POA: Diagnosis present

## 2020-03-07 DIAGNOSIS — D72819 Decreased white blood cell count, unspecified: Secondary | ICD-10-CM | POA: Diagnosis present

## 2020-03-07 DIAGNOSIS — I499 Cardiac arrhythmia, unspecified: Secondary | ICD-10-CM | POA: Diagnosis not present

## 2020-03-07 DIAGNOSIS — Z79891 Long term (current) use of opiate analgesic: Secondary | ICD-10-CM

## 2020-03-07 DIAGNOSIS — I10 Essential (primary) hypertension: Secondary | ICD-10-CM | POA: Diagnosis not present

## 2020-03-07 DIAGNOSIS — E1159 Type 2 diabetes mellitus with other circulatory complications: Secondary | ICD-10-CM | POA: Diagnosis not present

## 2020-03-07 DIAGNOSIS — R001 Bradycardia, unspecified: Secondary | ICD-10-CM

## 2020-03-07 DIAGNOSIS — I361 Nonrheumatic tricuspid (valve) insufficiency: Secondary | ICD-10-CM | POA: Diagnosis not present

## 2020-03-07 DIAGNOSIS — M549 Dorsalgia, unspecified: Secondary | ICD-10-CM | POA: Diagnosis present

## 2020-03-07 DIAGNOSIS — N069 Isolated proteinuria with unspecified morphologic lesion: Secondary | ICD-10-CM | POA: Diagnosis not present

## 2020-03-07 DIAGNOSIS — I272 Pulmonary hypertension, unspecified: Secondary | ICD-10-CM | POA: Diagnosis present

## 2020-03-07 DIAGNOSIS — I493 Ventricular premature depolarization: Principal | ICD-10-CM | POA: Diagnosis present

## 2020-03-07 DIAGNOSIS — E785 Hyperlipidemia, unspecified: Secondary | ICD-10-CM | POA: Diagnosis not present

## 2020-03-07 DIAGNOSIS — W19XXXS Unspecified fall, sequela: Secondary | ICD-10-CM | POA: Diagnosis not present

## 2020-03-07 DIAGNOSIS — R809 Proteinuria, unspecified: Secondary | ICD-10-CM | POA: Diagnosis present

## 2020-03-07 DIAGNOSIS — E1165 Type 2 diabetes mellitus with hyperglycemia: Secondary | ICD-10-CM | POA: Diagnosis not present

## 2020-03-07 DIAGNOSIS — I2510 Atherosclerotic heart disease of native coronary artery without angina pectoris: Secondary | ICD-10-CM | POA: Diagnosis not present

## 2020-03-07 DIAGNOSIS — R296 Repeated falls: Secondary | ICD-10-CM | POA: Diagnosis present

## 2020-03-07 DIAGNOSIS — Z7901 Long term (current) use of anticoagulants: Secondary | ICD-10-CM | POA: Diagnosis not present

## 2020-03-07 DIAGNOSIS — I110 Hypertensive heart disease with heart failure: Secondary | ICD-10-CM | POA: Diagnosis present

## 2020-03-07 DIAGNOSIS — Z87440 Personal history of urinary (tract) infections: Secondary | ICD-10-CM

## 2020-03-07 DIAGNOSIS — Z8249 Family history of ischemic heart disease and other diseases of the circulatory system: Secondary | ICD-10-CM

## 2020-03-07 DIAGNOSIS — E8809 Other disorders of plasma-protein metabolism, not elsewhere classified: Secondary | ICD-10-CM | POA: Diagnosis present

## 2020-03-07 DIAGNOSIS — Z833 Family history of diabetes mellitus: Secondary | ICD-10-CM

## 2020-03-07 DIAGNOSIS — F329 Major depressive disorder, single episode, unspecified: Secondary | ICD-10-CM | POA: Diagnosis present

## 2020-03-07 DIAGNOSIS — G4733 Obstructive sleep apnea (adult) (pediatric): Secondary | ICD-10-CM | POA: Diagnosis not present

## 2020-03-07 DIAGNOSIS — G8929 Other chronic pain: Secondary | ICD-10-CM | POA: Diagnosis present

## 2020-03-07 DIAGNOSIS — Z8349 Family history of other endocrine, nutritional and metabolic diseases: Secondary | ICD-10-CM

## 2020-03-07 DIAGNOSIS — R739 Hyperglycemia, unspecified: Secondary | ICD-10-CM

## 2020-03-07 DIAGNOSIS — Z841 Family history of disorders of kidney and ureter: Secondary | ICD-10-CM

## 2020-03-07 DIAGNOSIS — E039 Hypothyroidism, unspecified: Secondary | ICD-10-CM | POA: Diagnosis not present

## 2020-03-07 DIAGNOSIS — E109 Type 1 diabetes mellitus without complications: Secondary | ICD-10-CM | POA: Diagnosis not present

## 2020-03-07 DIAGNOSIS — Z993 Dependence on wheelchair: Secondary | ICD-10-CM

## 2020-03-07 DIAGNOSIS — E7800 Pure hypercholesterolemia, unspecified: Secondary | ICD-10-CM | POA: Diagnosis present

## 2020-03-07 DIAGNOSIS — I480 Paroxysmal atrial fibrillation: Secondary | ICD-10-CM | POA: Diagnosis not present

## 2020-03-07 DIAGNOSIS — R42 Dizziness and giddiness: Secondary | ICD-10-CM

## 2020-03-07 DIAGNOSIS — Z90710 Acquired absence of both cervix and uterus: Secondary | ICD-10-CM

## 2020-03-07 DIAGNOSIS — I5032 Chronic diastolic (congestive) heart failure: Secondary | ICD-10-CM | POA: Diagnosis not present

## 2020-03-07 DIAGNOSIS — Z85850 Personal history of malignant neoplasm of thyroid: Secondary | ICD-10-CM

## 2020-03-07 DIAGNOSIS — Z9181 History of falling: Secondary | ICD-10-CM

## 2020-03-07 DIAGNOSIS — Z8673 Personal history of transient ischemic attack (TIA), and cerebral infarction without residual deficits: Secondary | ICD-10-CM | POA: Diagnosis not present

## 2020-03-07 DIAGNOSIS — Z7401 Bed confinement status: Secondary | ICD-10-CM | POA: Diagnosis not present

## 2020-03-07 DIAGNOSIS — E66813 Obesity, class 3: Secondary | ICD-10-CM | POA: Diagnosis present

## 2020-03-07 DIAGNOSIS — M2550 Pain in unspecified joint: Secondary | ICD-10-CM | POA: Diagnosis not present

## 2020-03-07 DIAGNOSIS — Z809 Family history of malignant neoplasm, unspecified: Secondary | ICD-10-CM

## 2020-03-07 HISTORY — DX: Bradycardia, unspecified: R00.1

## 2020-03-07 HISTORY — DX: Dizziness and giddiness: R42

## 2020-03-07 HISTORY — DX: Cerebrovascular disease, unspecified: I67.9

## 2020-03-07 LAB — CBC WITH DIFFERENTIAL/PLATELET
Abs Immature Granulocytes: 0 10*3/uL (ref 0.00–0.07)
Basophils Absolute: 0 10*3/uL (ref 0.0–0.1)
Basophils Relative: 1 %
Eosinophils Absolute: 0 10*3/uL (ref 0.0–0.5)
Eosinophils Relative: 1 %
HCT: 34.7 % — ABNORMAL LOW (ref 36.0–46.0)
Hemoglobin: 10.8 g/dL — ABNORMAL LOW (ref 12.0–15.0)
Immature Granulocytes: 0 %
Lymphocytes Relative: 30 %
Lymphs Abs: 1 10*3/uL (ref 0.7–4.0)
MCH: 28.6 pg (ref 26.0–34.0)
MCHC: 31.1 g/dL (ref 30.0–36.0)
MCV: 92 fL (ref 80.0–100.0)
Monocytes Absolute: 0.4 10*3/uL (ref 0.1–1.0)
Monocytes Relative: 11 %
Neutro Abs: 1.9 10*3/uL (ref 1.7–7.7)
Neutrophils Relative %: 57 %
Platelets: 168 10*3/uL (ref 150–400)
RBC: 3.77 MIL/uL — ABNORMAL LOW (ref 3.87–5.11)
RDW: 12.8 % (ref 11.5–15.5)
WBC: 3.3 10*3/uL — ABNORMAL LOW (ref 4.0–10.5)
nRBC: 0 % (ref 0.0–0.2)

## 2020-03-07 LAB — COMPREHENSIVE METABOLIC PANEL
ALT: 54 U/L — ABNORMAL HIGH (ref 0–44)
AST: 68 U/L — ABNORMAL HIGH (ref 15–41)
Albumin: 3.1 g/dL — ABNORMAL LOW (ref 3.5–5.0)
Alkaline Phosphatase: 107 U/L (ref 38–126)
Anion gap: 5 (ref 5–15)
BUN: 12 mg/dL (ref 8–23)
CO2: 26 mmol/L (ref 22–32)
Calcium: 8.6 mg/dL — ABNORMAL LOW (ref 8.9–10.3)
Chloride: 110 mmol/L (ref 98–111)
Creatinine, Ser: 0.99 mg/dL (ref 0.44–1.00)
GFR calc Af Amer: >60 mL/min (ref >60)
GFR calc non Af Amer: 58 mL/min — ABNORMAL LOW (ref >60)
Glucose, Bld: 189 mg/dL — ABNORMAL HIGH (ref 70–99)
Potassium: 3.5 mmol/L (ref 3.5–5.1)
Sodium: 141 mmol/L (ref 135–145)
Total Bilirubin: 0.5 mg/dL (ref 0.3–1.2)
Total Protein: 6.1 g/dL — ABNORMAL LOW (ref 6.5–8.1)

## 2020-03-07 LAB — URINALYSIS, ROUTINE W REFLEX MICROSCOPIC
Bacteria, UA: NONE SEEN
Bilirubin Urine: NEGATIVE
Glucose, UA: NEGATIVE mg/dL
Hgb urine dipstick: NEGATIVE
Ketones, ur: NEGATIVE mg/dL
Leukocytes,Ua: NEGATIVE
Nitrite: NEGATIVE
Protein, ur: 100 mg/dL — AB
Specific Gravity, Urine: 1.01 (ref 1.005–1.030)
pH: 7 (ref 5.0–8.0)

## 2020-03-07 LAB — TSH: TSH: 2.978 u[IU]/mL (ref 0.350–4.500)

## 2020-03-07 LAB — RAPID URINE DRUG SCREEN, HOSP PERFORMED
Amphetamines: NOT DETECTED
Barbiturates: NOT DETECTED
Benzodiazepines: NOT DETECTED
Cocaine: NOT DETECTED
Opiates: NOT DETECTED
Tetrahydrocannabinol: NOT DETECTED

## 2020-03-07 LAB — CBG MONITORING, ED
Glucose-Capillary: 173 mg/dL — ABNORMAL HIGH (ref 70–99)
Glucose-Capillary: 125 mg/dL — ABNORMAL HIGH (ref 70–99)

## 2020-03-07 LAB — SARS CORONAVIRUS 2 (TAT 6-24 HRS): SARS Coronavirus 2: NEGATIVE

## 2020-03-07 LAB — HIV ANTIBODY (ROUTINE TESTING W REFLEX): HIV Screen 4th Generation wRfx: NONREACTIVE

## 2020-03-07 LAB — HEMOGLOBIN A1C
Hgb A1c MFr Bld: 8.4 % — ABNORMAL HIGH (ref 4.8–5.6)
Mean Plasma Glucose: 194.38 mg/dL

## 2020-03-07 LAB — GLUCOSE, CAPILLARY: Glucose-Capillary: 134 mg/dL — ABNORMAL HIGH (ref 70–99)

## 2020-03-07 LAB — TROPONIN I (HIGH SENSITIVITY)
Troponin I (High Sensitivity): 30 ng/L — ABNORMAL HIGH (ref <18)
Troponin I (High Sensitivity): 69 ng/L — ABNORMAL HIGH (ref <18)

## 2020-03-07 MED ORDER — LEVOTHYROXINE SODIUM 75 MCG PO TABS
175.0000 ug | ORAL_TABLET | Freq: Every day | ORAL | Status: DC
  Administered 2020-03-08 – 2020-03-14 (×7): 175 ug via ORAL
  Filled 2020-03-07 (×7): qty 1

## 2020-03-07 MED ORDER — GABAPENTIN 600 MG PO TABS
300.0000 mg | ORAL_TABLET | Freq: Every day | ORAL | Status: DC
  Administered 2020-03-07 – 2020-03-13 (×7): 300 mg via ORAL
  Filled 2020-03-07 (×4): qty 1
  Filled 2020-03-07: qty 0.5
  Filled 2020-03-07 (×3): qty 1

## 2020-03-07 MED ORDER — INSULIN PUMP
Freq: Three times a day (TID) | SUBCUTANEOUS | Status: DC
  Administered 2020-03-08: 16:00:00 1 via SUBCUTANEOUS
  Administered 2020-03-08 – 2020-03-09 (×3): 0.5 via SUBCUTANEOUS
  Administered 2020-03-10: 02:00:00 0.4 via SUBCUTANEOUS
  Administered 2020-03-12: 18:00:00 1.2 via SUBCUTANEOUS
  Administered 2020-03-12: 12:00:00 0.4 via SUBCUTANEOUS
  Filled 2020-03-07: qty 1

## 2020-03-07 MED ORDER — FLUOXETINE HCL 20 MG PO TABS: 20.0000 mg | ORAL_TABLET | Freq: Every day | ORAL | Status: DC

## 2020-03-07 MED ORDER — ATORVASTATIN CALCIUM 80 MG PO TABS
80.0000 mg | ORAL_TABLET | Freq: Every day | ORAL | Status: DC
  Filled 2020-03-07: qty 1

## 2020-03-07 MED ORDER — ACETAMINOPHEN 650 MG RE SUPP: 650.0000 mg | Freq: Four times a day (QID) | RECTAL | Status: DC | PRN

## 2020-03-07 MED ORDER — FLUOXETINE HCL 20 MG PO CAPS
30.0000 mg | ORAL_CAPSULE | Freq: Every day | ORAL | Status: DC
  Administered 2020-03-08 – 2020-03-14 (×7): 30 mg via ORAL
  Filled 2020-03-07 (×7): qty 1

## 2020-03-07 MED ORDER — APIXABAN 5 MG PO TABS
5.0000 mg | ORAL_TABLET | Freq: Two times a day (BID) | ORAL | Status: DC
  Administered 2020-03-07 – 2020-03-14 (×14): 5 mg via ORAL
  Filled 2020-03-07 (×15): qty 1

## 2020-03-07 MED ORDER — ALPRAZOLAM 0.25 MG PO TABS
0.2500 mg | ORAL_TABLET | Freq: Once | ORAL | Status: AC
  Administered 2020-03-11: 21:00:00 0.25 mg via ORAL
  Filled 2020-03-07 (×4): qty 1

## 2020-03-07 MED ORDER — SODIUM CHLORIDE 0.9 % IV BOLUS
500.0000 mL | Freq: Once | INTRAVENOUS | Status: AC
  Administered 2020-03-07: 21:00:00 500 mL via INTRAVENOUS

## 2020-03-07 MED ORDER — ACETAMINOPHEN 325 MG PO TABS
650.0000 mg | ORAL_TABLET | Freq: Four times a day (QID) | ORAL | Status: DC | PRN
  Administered 2020-03-10 – 2020-03-11 (×2): 650 mg via ORAL
  Filled 2020-03-07 (×2): qty 2

## 2020-03-07 NOTE — ED Provider Notes (Signed)
[facility] EMERGENCY DEPARTMENT Provider Note   CSN: [id number] Arrival date & time: 03/07/20  1438     History Chief Complaint  Patient presents with  . Bradycardia    [name] is a 69 y.o. female.  Level 5 caveat for acuity of condition.  Chief complaint bradycardia.  Patient received a Covid vaccine earlier today.  She was entering her home, she fell onto the floor.  She is uncertain whether she actually passed out.  No prodromal symptoms.  Pulse was noted to be low via the EMS.  No chest pain, dyspnea, neurological deficits.  She is a type I diabetic with an insulin pump.  EKG reviewed from 11/12/2019 which revealed sinus rhythm with a sinus arrhythmia pulse 61.        Past Medical History:  Diagnosis Date  . Abnormal echocardiogram   . Anemia   . Arthritis   . CHF (congestive heart failure) ([place])   . Closed fracture of left proximal humerus 07/08/2019  . Depression   . GERD (gastroesophageal reflux disease)   . Hemorrhoids   . Hyperlipidemia   . Hypertension   . Hypothyroidism   . Seasonal allergies   . Stroke ([place])   . Thyroid cancer ([place])    per pt S/p Total Thyroidectomy with Radioactive Iodine Therapy  . Type 1 diabetes mellitus [facility])     Patient Active Problem List   Diagnosis Date Noted  . Coagulation defect ([place]) 12/12/2019  . Acquired thrombophilia ([place]) 11/12/2019  . Dependent for wheelchair mobility 08/05/2019  . OSA (obstructive sleep apnea) 05/27/2019  . Atrial fibrillation ([place]) 05/27/2019  . Insulin pump in place   . (HFpEF) heart failure with preserved ejection fraction ([place]) 03/17/2019  . 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 BPPV (benign paroxysmal positional vertigo), right 06/13/2017  . Essential hypertension 10/29/2015  . Cerebrovascular accident (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 ([place]) 10/29/2015  . Intracranial vascular stenosis   . HLD (hyperlipidemia)   . Morbid obesity ([place])  05/29/2013  . 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 ([place]) 46/96/2952  . Hyperlipidemia 01/25/2012  . Hypothyroidism 01/25/2012  . GERD (gastroesophageal reflux disease) 01/25/2012    Past Surgical History:  Procedure Laterality Date  . ABDOMINAL ANGIOGRAM N/A 05/14/2012   Procedure: ABDOMINAL ANGIOGRAM;  Surgeon: Laverda Page, MD;  Location: [facility] CATH LAB;  Service: Cardiovascular;  Laterality: N/A;  . ABDOMINAL HYSTERECTOMY  1995   partial  . ANKLE FRACTURE SURGERY Right   . CARDIAC CATHETERIZATION     with coronary angiogram  . COLONOSCOPY N/A 09/09/2014   Procedure: COLONOSCOPY;  Surgeon: Gatha Mayer, MD;  Location: [facility];  Service: Endoscopy;  Laterality: N/A;  . ESOPHAGOGASTRODUODENOSCOPY (EGD) WITH PROPOFOL N/A 12/03/2018   Procedure: ESOPHAGOGASTRODUODENOSCOPY (EGD) WITH PROPOFOL;  Surgeon: Doran Stabler, MD;  Location: [facility];  Service: Gastroenterology;  Laterality: N/A;  . LEFT AND RIGHT HEART CATHETERIZATION WITH CORONARY ANGIOGRAM N/A 05/14/2012   Procedure: LEFT AND RIGHT HEART CATHETERIZATION WITH CORONARY ANGIOGRAM;  Surgeon: Laverda Page, MD;  Location: [facility] CATH LAB;  Service: Cardiovascular;  Laterality: N/A;  . LOOP RECORDER INSERTION N/A 12/23/2018   Procedure: LOOP RECORDER INSERTION;  Surgeon: Constance Haw, MD;  Location: [facility] CV LAB;  Service: Cardiovascular;  Laterality: N/A;  . MINI METER   09/08/2014   ELECTRONIC INSULIN PUMP  . TEE WITHOUT CARDIOVERSION N/A 10/16/2018   Procedure: TRANSESOPHAGEAL ECHOCARDIOGRAM (TEE);  Surgeon: Buford Dresser,  MD;  Location: [facility];  Service: Cardiovascular;  Laterality: N/A;  . THYROIDECTOMY  2006     OB History   No obstetric history on file.     Family History  Problem Relation Age of Onset  . Heart disease Mother   . Hyperlipidemia Mother   . Hypertension Mother   . Renal Disease Mother        insufficiency  . Heart attack Mother        in  his 12's  . Early death Father        Head Injury at Work  . Hyperlipidemia Father   . Hypertension Father   . Stroke Son   . Diabetes Maternal Aunt   . Prostate cancer Paternal Uncle   . Heart disease Maternal Aunt   . Heart failure Maternal Grandfather   . Heart disease Maternal Grandfather   . Heart attack Maternal Grandfather        Died in his 63's  . Breast cancer Neg Hx     Social History   Tobacco Use  . Smoking status: Never Smoker  . Smokeless tobacco: Never Used  Substance Use Topics  . Alcohol use: No  . Drug use: No    Home Medications Prior to Admission medications   Medication Sig Start Date End Date Taking? Authorizing Provider  ACCU-CHEK GUIDE test strip  01/05/20   [provider]  acetaminophen (TYLENOL) 500 MG tablet Take 1,000 mg by mouth as needed for mild pain.    [provider]  apixaban (ELIQUIS) 5 MG TABS tablet Take 1 tablet (5 mg total) by mouth 2 (two) times daily. 01/14/20   Fenton, Clint R, PA  atorvastatin (LIPITOR) 80 MG tablet Take 1 tablet (80 mg total) by mouth daily. 06/02/19   Shirley, [name], DO  Blood Glucose Monitoring Suppl (ACCU-CHEK GUIDE) w/Device KIT  01/15/20   [provider]  CRANBERRY EXTRACT PO Take 1 capsule by mouth daily.    [provider]  fexofenadine (ALLEGRA) 30 MG tablet Take 30 mg by mouth daily as needed (allergies).     [provider]  FLUoxetine (PROZAC) 10 MG capsule Take 1 capsule (10 mg total) by mouth daily. 12/29/19   Guadalupe Dawn, MD  FLUoxetine (PROZAC) 20 MG tablet Take 1 tablet (20 mg total) by mouth daily. Take with 59m for 386mtotal. 12/29/19   FlGuadalupe DawnMD  gabapentin (NEURONTIN) 300 MG capsule TAKE ONE CAPSULE BY MOUTH AT BEDTIME 12/23/19   Shirley, JoMartiniqueDO  HYDROcodone-acetaminophen (NORCO/VICODIN) 5-325 MG tablet Take 2 tablets by mouth every 4 (four) hours as needed. 07/06/19   GeRecardo EvangelistPA-C  insulin aspart (NOVOLOG) 100 UNIT/ML  injection To be used in patient's insulin pump 01/30/20   ShEnid DerryJoMartiniqueDO  Insulin Human (INSULIN PUMP) SOLN Inject 1 each into the skin continuous. Novolog insulin 01/02/20   FlGuadalupe DawnMD  levothyroxine (SYNTHROID, LEVOTHROID) 175 MCG tablet Take 175 mcg by mouth daily before breakfast. Every day but Sunday 06/06/17   [provider]  loperamide (IMODIUM) 2 MG capsule TAKE 1 CAPSULE( 2 MG TOTAL) BY MOUTH FOUR TIMES DAILY AS NEEDED FOR DIARRHEA OR LOOSE STOOLS 12/11/19   ShEnid DerryJoMartiniqueDO  losartan (COZAAR) 25 MG tablet TAKE 1 TABLET BY MOUTH AT BEDTIME 02/19/20   Shirley, JoMartiniqueDO  metoprolol succinate (TOPROL-XL) 100 MG 24 hr tablet Take 1 tablet (100 mg total) by mouth daily. Take with or immediately following a meal. 11/12/19  Fenton, Clint R, PA  ondansetron (ZOFRAN-ODT) 4 MG disintegrating tablet DISSOLVE 1 TABLET BY MOUTH EVERY 8 HOURS AS NEEDED FOR NAUSEA AND VOMITING 09/16/19   Shirley, [name], DO  oxyCODONE (OXY IR/ROXICODONE) 5 MG immediate release tablet Take 1 tablet (5 mg total) by mouth every 8 (eight) hours as needed for severe pain. 07/23/19   Meredith Pel, MD  pantoprazole (PROTONIX) 40 MG tablet Take 1 tablet (40 mg total) by mouth 2 (two) times daily before a meal. 12/29/19   Guadalupe Dawn, MD  potassium chloride (K-DUR) 10 MEQ tablet Take 1 tablet by mouth on days toresmide is taken 03/20/19   Fenton, Clint R, PA  torsemide (DEMADEX) 10 MG tablet TAKE 1 TABLET BY MOUTH DAILY AS NEEDED FOR FLUID Patient taking differently: Take 10 mg by mouth daily as needed (fluid).  03/14/19   Shirley, [name], DO  losartan (COZAAR) 25 MG tablet Take 1 tablet (25 mg total) by mouth at bedtime. 04/29/19   Shirley, [name], DO  ondansetron (ZOFRAN-ODT) 4 MG disintegrating tablet DISSOLVE ONE TABLET BY MOUTH EVERY 8 HOURS AS NEEDED FOR NAUSEA AND VOMITING 09/02/19   Shirley, [name], DO  pantoprazole (PROTONIX) 40 MG tablet Take 1 tablet (40 mg total) by mouth daily. 12/09/18    Shirley, [name], DO    Allergies    Latex  Review of Systems   Review of Systems  Unable to perform ROS: Acuity of condition    Physical Exam Updated Vital Signs BP (!) 173/55   Pulse (!) 43   Temp 98 F (36.7 C) (Oral)   Resp 17   Ht 5' 7" (1.702 m)   Wt 105 kg   SpO2 98%   BMI 36.26 kg/m   Physical Exam Vitals and nursing note reviewed.  Constitutional:      Appearance: Normal appearance. She is well-developed.     Comments: nad  HENT:     Head: Normocephalic and atraumatic.  Eyes:     Conjunctiva/sclera: Conjunctivae normal.  Cardiovascular:     Rate and Rhythm: Bradycardia present.  Pulmonary:     Effort: Pulmonary effort is normal.     Breath sounds: Normal breath sounds.  Abdominal:     General: Bowel sounds are normal.     Palpations: Abdomen is soft.  Musculoskeletal:        General: Normal range of motion.     Cervical back: Neck supple.  Skin:    General: Skin is warm and dry.  Neurological:     General: No focal deficit present.     Mental Status: She is alert and oriented to person, place, and time.  Psychiatric:        Behavior: Behavior normal.     ED Results / Procedures / Treatments   Labs (all labs ordered are listed, but only abnormal results are displayed) Labs Reviewed  CBC WITH DIFFERENTIAL/PLATELET - Abnormal; Notable for the following components:      Result Value   WBC 3.3 (*)    RBC 3.77 (*)    Hemoglobin 10.8 (*)    HCT 34.7 (*)    All other components within normal limits  COMPREHENSIVE METABOLIC PANEL - Abnormal; Notable for the following components:   Glucose, Bld 189 (*)    Calcium 8.6 (*)    Total Protein 6.1 (*)    Albumin 3.1 (*)    AST 68 (*)    ALT 54 (*)    GFR calc non Af Amer 58 (*)  All other components within normal limits  CBG MONITORING, ED - Abnormal; Notable for the following components:   Glucose-Capillary 173 (*)    All other components within normal limits    EKG EKG  Interpretation  Date/Time:  Sunday March 07 2020 14:48:21 EDT Ventricular Rate:  59 PR Interval:    QRS Duration: 105 QT Interval:  455 QTC Calculation: 451 R Axis:   5 Text Interpretation: Sinus rhythm Multiple ventricular premature complexes Sinus pause Borderline repolarization abnormality Confirmed by Nat Christen [phone]) on 03/07/2020 4:06:11 PM   Radiology DG Chest Port 1 View  Result Date: 03/07/2020 CLINICAL DATA:  Bradycardia EXAM: PORTABLE CHEST 1 VIEW COMPARISON:  04/15/2019 FINDINGS: Cardiomegaly. Implantable loop recorder. Both lungs are clear. The visualized skeletal structures are unremarkable. IMPRESSION: Cardiomegaly without acute abnormality of the lungs in AP portable projection. Electronically Signed   By: Eddie Candle M.D.   On: 03/07/2020 15:03    Procedures Procedures (including critical care time)  Medications Ordered in ED Medications - No data to display  ED Course  I have reviewed the triage vital signs and the nursing notes.  Pertinent labs & imaging results that were available during my care of the patient were reviewed by me and considered in my medical decision making (see chart for details).    MDM Rules/Calculators/A&P                      Patient is bradycardic.  Pulse 40-45 and irregular with PVCs.  There appears to be P waves.  She is slightly anemic and hyperglycemic.  Will admit for further observation.   CRITICAL CARE Performed by: Nat Christen Total critical care time: 30 minutes Critical care time was exclusive of separately billable procedures and treating other patients. Critical care was necessary to treat or prevent imminent or life-threatening deterioration. Critical care was time spent personally by me on the following activities: development of treatment plan with patient and/or surrogate as well as nursing, discussions with consultants, evaluation of patient's response to treatment, examination of patient, obtaining history from  patient or surrogate, ordering and performing treatments and interventions, ordering and review of laboratory studies, ordering and review of radiographic studies, pulse oximetry and re-evaluation of patient's condition. Final Clinical Impression(s) / ED Diagnoses Final diagnoses:  Syncope, unspecified syncope type  Bradycardia    Rx / DC Orders ED Discharge Orders    None       Nat Christen, MD 03/07/20 1625

## 2020-03-07 NOTE — ED Notes (Signed)
Pt placed on defib pads

## 2020-03-07 NOTE — ED Notes (Signed)
Portable xray at bedside.

## 2020-03-07 NOTE — ED Triage Notes (Signed)
From home via EMS. Bradycardia with rate in 30s per EMS. Denies chest pain, SOB. Hx of afib. Type 1 diabetic w/ insulin pump. A/Ox4 at this time, NAD.

## 2020-03-07 NOTE — H&P (Addendum)
[facility] Admission History and Physical Service Pager: [phone]  Patient name: [name] Medical record number: [id number] Date of birth: July 16, 1951 Age: 69 y.o. Gender: female  Primary Care Provider: Shirley, [name], DO Consultants: cardiology Code Status: full  Preferred Emergency Contact: [name] (sister) [phone]  Chief Complaint: syncope  Assessment and Plan: Lynel Forester [name] is a 69 y.o. female presenting with syncope . PMH is significant for type 1 diabetes, A. fib, history of CVAs, hypothyroidism, hypertension, hyperlipidemia, history of HFpEF, GERD, depression/anxiety, chronic back pain.  Symptomatic bradycardia-unclear etiology Patient presents with a sudden fall and LOC upon standing at home after Covid vaccine followed by dizziness and difficulty speaking. EMS recognized bradycardia in 40s. Patient does not have history of bradycardia. In the ED patient's HR 40s but asymptomatic at rest. Top differentials include stroke, vasovagal, orthostatic due to dehydration vs medication vs cardiac cause, cardiac arrhythmia or Covid vaccine side effect. Patient has a history of strokes, (recent CVA in 10/13/2018 documented as a cryptogenic stroke and has residual left-sided weakness. Given history of strokes and dizzy spell today cannot rule out stroke.  Patient also Afib and takes Eliquis. Cannot rule out intracranial bleed as consequence of fall, although denies head injury. CT head is negative. Patient takes metoprolol for Afib. Denies overdose of metoprolol which could cause symptomatic bradycardia. No other medications which caused bradycardia on med rec.  EKG shows sinus rhythm with PVCs. No signs of heart block with normal PR interval and normal QRS complex. Considered Covid vaccine side effect, however patient was monitored for 15 minutes after [place] the vaccine [place]. Hypoglycemia as cause unlikely as CBGs wnl.  Patient is  compliant with levothyroxine and untreated hypothyroidism is unlikely cause. TSH wnl. Troponins 30, second pending for ACS rule out. -Admit to FPTS, medical telemetry, Dr McDiarmid -Continuous telemetry and pulse oximetry  -Vitals per floor routine -Echo -Orthostatic Bps -Up with assistance -SLP eval -PT/OT -AM CBC and CMP -Fu troponins -Fu Covid test  H/o HFpEF Last echo 10/16/2018, EF 60 to 65%. -Home meds include torsemide 10 mg as needed for swelling -Hold home torsemide -FU echo from today  H/o CVA with residual left sided weakness  -CT head pending  -Neurological status  Hypothyroidism Last TSH in 2019-2.47 Home meds: Levothyroxine 175 mcg once daily except Sundays - TSH today, consider T3-T4 if necessary -Continue Levothyroxine  T1DM CBGs 125, 173, on admission, last A1c 8.4 today.  Sees Dr. Debbora Presto for endocrinology. Patient endorses peripheral neuropathy. Could also have autonomic reflex issues contributing to fall. Home meds: Insulin aspart via patient's insulin pump -Continue insulin pump - monitor CBGs  Normocytic anemia  leukopenia On admission Hgb 10.8, WBC 3.3 -Montitor with daily CBC  Afib HR 50s, EKG showed sinus rhythm PVCs Home meds include: Eliquis 5 mg BID, metoprolol 100 mg XL once daily -Hold metoprolol -Continue Eliquis   HTN SBP 159-190 and DBP 62-67, denies neurological symptoms Home meds: Metoprolol 100 mg once daily -Hold metoprolol due to bradycardia -Cardiology following, appreciate recommendations -Consider starting hydralazine for isolated BP control and less effect on HR -Monitor Bps  GERD Home meds include Protonix 40 mg -Continue Protonix  Depression- home meds: fluoxetine 66m daily -Continue fluoxetine  Hypercholesterolemia Home meds include atorvastatin 80 mg -Continue statin  Elevated transaminases AST 68, ALT 54 -CMP a.m. -Consider hepatitis panel -Consider right upper quadrant ultrasound  Chronic Back  Pain Home meds include: Gabapentin [id number] -Continue gabapentin 300 mg  FEN/GI: Heart healthy, Protonix Prophylaxis: Lovenox   Disposition: Inpatient for the next 2 to 3 days  History of Present Illness:  [name] is a 69 y.o. female presenting with   Patient was feeling otherwise well this morning and received first covid vaccine [place] 1 PM [place] the mall.  Upon arrival back at home, she had a brief dizzy spell.  She was walking into the house with her rollator. She was getting off her rollator (from sitting) in her home she fell forwards on the floor. Denies head injury. Her granddaughter and sister were present who ran to get help from 2 gentlemen who were outside. She felt dizzy but no loss of consciousness.  Denies any dizziness prior to the fall. Sister witnessed the fall and says that patient's eyes rolled back in her head and she wasn't responding. Patient remembers hearing her sister talking but it was like in a fog and she couldn't respond even though she wanted to. Sister called 20.  Denies palpitations, chest pain, shaking of body, new limb weakness, slurred speech (but couldn't say anything despite wanting to), incontinence, vision changes, headaches. Denies any known sick contacts or fevers. H/o frequent UTIs.  Denies hematuria, rectal bleeding or melena. Denies covid contacts. Endorses coughing on way to hospital with sensation that something is in her throat.   Currently endorses some leg pain. Has some residual LE weakness. Patient has had a previous stroke and multiple falls. Admits to having anxiety about falling again. Her PCP is currently working on getting her a scooter.  Thinks that she has missed medications frequently because of memory problems. She missed her eliquis today and atorvastatin, losartan today.  Reports taking her diabetic and thyroid medications as prescribed.   Review Of Systems: Per HPI with the following additions:  ROS  Patient Active  Problem List   Diagnosis Date Noted  . Bradycardia 03/07/2020  . Coagulation defect ([place]) 12/12/2019  . Acquired thrombophilia ([place]) 11/12/2019  . Dependent for wheelchair mobility 08/05/2019  . OSA (obstructive sleep apnea) 05/27/2019  . Atrial fibrillation ([place]) 05/27/2019  . Insulin pump in place   . (HFpEF) heart failure with preserved ejection fraction ([place]) 03/17/2019  . 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 BPPV (benign paroxysmal positional vertigo), right 06/13/2017  . Essential hypertension 10/29/2015  . Cerebrovascular accident (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 ([place]) 10/29/2015  . Intracranial vascular stenosis   . HLD (hyperlipidemia)   . Morbid obesity ([place]) 05/29/2013  . 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 ([place]) 67/11/4579  . Hyperlipidemia 01/25/2012  . Hypothyroidism 01/25/2012  . GERD (gastroesophageal reflux disease) 01/25/2012    Past Medical History: Past Medical History:  Diagnosis Date  . Abnormal echocardiogram   . Anemia   . Arthritis   . CHF (congestive heart failure) ([place])   . Closed fracture of left proximal humerus 07/08/2019  . Depression   . GERD (gastroesophageal reflux disease)   . Hemorrhoids   . Hyperlipidemia   . Hypertension   . Hypothyroidism   . Seasonal allergies   . Stroke ([place])   . Thyroid cancer ([place])    per pt S/p Total Thyroidectomy with Radioactive Iodine Therapy  . Type 1 diabetes mellitus ([place])     Past Surgical History: Past Surgical History:  Procedure Laterality Date  . ABDOMINAL ANGIOGRAM N/A 05/14/2012   Procedure: ABDOMINAL ANGIOGRAM;  Surgeon: Laverda Page, MD;  Location: [facility] CATH LAB;  Service: Cardiovascular;  Laterality: N/A;  .  ABDOMINAL HYSTERECTOMY  1995   partial  . ANKLE FRACTURE SURGERY Right   . CARDIAC CATHETERIZATION     with coronary angiogram  . COLONOSCOPY N/A 09/09/2014   Procedure: COLONOSCOPY;  Surgeon: Gatha Mayer, MD;  Location: [facility];  Service:  Endoscopy;  Laterality: N/A;  . ESOPHAGOGASTRODUODENOSCOPY (EGD) WITH PROPOFOL N/A 12/03/2018   Procedure: ESOPHAGOGASTRODUODENOSCOPY (EGD) WITH PROPOFOL;  Surgeon: Doran Stabler, MD;  Location: [facility];  Service: Gastroenterology;  Laterality: N/A;  . LEFT AND RIGHT HEART CATHETERIZATION WITH CORONARY ANGIOGRAM N/A 05/14/2012   Procedure: LEFT AND RIGHT HEART CATHETERIZATION WITH CORONARY ANGIOGRAM;  Surgeon: Laverda Page, MD;  Location: [facility] CATH LAB;  Service: Cardiovascular;  Laterality: N/A;  . LOOP RECORDER INSERTION N/A 12/23/2018   Procedure: LOOP RECORDER INSERTION;  Surgeon: Constance Haw, MD;  Location: [facility] CV LAB;  Service: Cardiovascular;  Laterality: N/A;  . MINI METER   09/08/2014   ELECTRONIC INSULIN PUMP  . TEE WITHOUT CARDIOVERSION N/A 10/16/2018   Procedure: TRANSESOPHAGEAL ECHOCARDIOGRAM (TEE);  Surgeon: Buford Dresser, MD;  Location: [facility] ENDOSCOPY;  Service: Cardiovascular;  Laterality: N/A;  . THYROIDECTOMY  2006    Social History: Social History   Tobacco Use  . Smoking status: Never Smoker  . Smokeless tobacco: Never Used  Substance Use Topics  . Alcohol use: No  . Drug use: No   Additional social history:  Please also refer to relevant sections of EMR.  Family History: Family History  Problem Relation Age of Onset  . Heart disease Mother   . Hyperlipidemia Mother   . Hypertension Mother   . Renal Disease Mother        insufficiency  . Heart attack Mother        in his 62's  . Early death Father        Head Injury at Work  . Hyperlipidemia Father   . Hypertension Father   . Stroke Son   . Diabetes Maternal Aunt   . Prostate cancer Paternal Uncle   . Heart disease Maternal Aunt   . Heart failure Maternal Grandfather   . Heart disease Maternal Grandfather   . Heart attack Maternal Grandfather        Died in his 22's  . Breast cancer Neg Hx    (If not completed, MUST add something in)  Allergies and  Medications: Allergies  Allergen Reactions  . Latex Rash   No current facility-administered medications on file prior to encounter.   Current Outpatient Medications on File Prior to Encounter  Medication Sig Dispense Refill  . ACCU-CHEK GUIDE test strip     . acetaminophen (TYLENOL) 500 MG tablet Take 1,000 mg by mouth as needed for mild pain.    Marland Kitchen apixaban (ELIQUIS) 5 MG TABS tablet Take 1 tablet (5 mg total) by mouth 2 (two) times daily. 180 tablet 2  . atorvastatin (LIPITOR) 80 MG tablet Take 1 tablet (80 mg total) by mouth daily. (Patient taking differently: Take 80 mg by mouth every evening. ) 90 tablet 2  . Blood Glucose Monitoring Suppl (ACCU-CHEK GUIDE) w/Device KIT     . CRANBERRY EXTRACT PO Take 1 capsule by mouth daily.    . fexofenadine (ALLEGRA) 30 MG tablet Take 30 mg by mouth daily as needed (allergies).     Marland Kitchen FLUoxetine (PROZAC) 10 MG capsule Take 1 capsule (10 mg total) by mouth daily. (Patient taking differently: Take 10 mg by mouth daily. Take along with 30m tablet per patient)  90 capsule 1  . FLUoxetine (PROZAC) 20 MG tablet Take 1 tablet (20 mg total) by mouth daily. Take with 72m for 325mtotal. (Patient taking differently: Take 20 mg by mouth daily. Take along with 1067mablet per patient) 90 tablet 2  . gabapentin (NEURONTIN) 300 MG capsule TAKE ONE CAPSULE BY MOUTH AT BEDTIME 90 capsule 3  . insulin aspart (NOVOLOG) 100 UNIT/ML injection To be used in patient's insulin pump 30 mL 0  . Insulin Human (INSULIN PUMP) SOLN Inject 1 each into the skin continuous. Novolog insulin 1 each 0  . levothyroxine (SYNTHROID, LEVOTHROID) 175 MCG tablet Take 175 mcg by mouth daily before breakfast.   12  . losartan (COZAAR) 25 MG tablet TAKE 1 TABLET BY MOUTH AT BEDTIME (Patient taking differently: Take 25 mg by mouth at bedtime. ) 90 tablet 3  . metoprolol succinate (TOPROL-XL) 100 MG 24 hr tablet Take 1 tablet (100 mg total) by mouth daily. Take with or immediately following a  meal. 90 tablet 1  . pantoprazole (PROTONIX) 40 MG tablet Take 1 tablet (40 mg total) by mouth 2 (two) times daily before a meal. 180 tablet 0  . potassium chloride (K-DUR) 10 MEQ tablet Take 1 tablet by mouth on days toresmide is taken (Patient taking differently: Take 10 mEq by mouth daily as needed (For low potassium). Take along with torsemide when taken) 30 tablet 2  . torsemide (DEMADEX) 10 MG tablet TAKE 1 TABLET BY MOUTH DAILY AS NEEDED FOR FLUID (Patient taking differently: Take 10 mg by mouth daily as needed (fluid). Take along with potassium when taken) 90 tablet 0  . HYDROcodone-acetaminophen (NORCO/VICODIN) 5-325 MG tablet Take 2 tablets by mouth every 4 (four) hours as needed. (Patient not taking: Reported on 03/07/2020) 20 tablet 0  . loperamide (IMODIUM) 2 MG capsule TAKE 1 CAPSULE( 2 MG TOTAL) BY MOUTH FOUR TIMES DAILY AS NEEDED FOR DIARRHEA OR LOOSE STOOLS (Patient not taking: Reported on 03/07/2020) 30 capsule 0  . ondansetron (ZOFRAN-ODT) 4 MG disintegrating tablet DISSOLVE 1 TABLET BY MOUTH EVERY 8 HOURS AS NEEDED FOR NAUSEA AND VOMITING (Patient not taking: Reported on 03/07/2020) 20 tablet 0  . oxyCODONE (OXY IR/ROXICODONE) 5 MG immediate release tablet Take 1 tablet (5 mg total) by mouth every 8 (eight) hours as needed for severe pain. (Patient not taking: Reported on 03/07/2020) 30 tablet 0  . [DISCONTINUED] losartan (COZAAR) 25 MG tablet Take 1 tablet (25 mg total) by mouth at bedtime. 90 tablet 3  . [DISCONTINUED] ondansetron (ZOFRAN-ODT) 4 MG disintegrating tablet DISSOLVE ONE TABLET BY MOUTH EVERY 8 HOURS AS NEEDED FOR NAUSEA AND VOMITING 20 tablet 0  . [DISCONTINUED] pantoprazole (PROTONIX) 40 MG tablet Take 1 tablet (40 mg total) by mouth daily. 90 tablet 0    Objective: BP (!) 147/68   Pulse (!) 59   Temp 98 F (36.7 C) (Oral)   Resp 17   Ht 5' 7"  (1.702 m)   Wt 105 kg   SpO2 94%   BMI 36.26 kg/m    Exam: General: Well-appearing [age]ar old female, alert and  pleasant Eyes: Normal extraocular eye movements, no scleral icterus, PERRLA ENTM: No thyromegaly, lymphadenopathy, no pharyngeal edema, poor dentition Neck: Full, normal range of movement Cardiovascular: S1 and S2 present, irregular rhythm and bradycardic Respiratory: Bilateral basilar crackles, no wheeze, normal work of breathing Gastrointestinal: Abdomen soft, epigastric tenderness, no guarding, bowel sounds present MSK: Moving all 4 limbs equally Derm: Warm and dry,  Neuro: Cranial nerves grossly intact  Psych: Normal mood, normal affect  Labs and Imaging: CBC BMET  Recent Labs  Lab 03/07/20 1505  WBC 3.3*  HGB 10.8*  HCT 34.7*  PLT 168   Recent Labs  Lab 03/07/20 1505  NA 141  K 3.5  CL 110  CO2 26  BUN 12  CREATININE 0.99  GLUCOSE 189*  CALCIUM 8.6*     EKG: HR 59, sinus rhythm, PVCs   Lattie Haw, MD 03/07/2020, 6:37 PM PGY-1, [place] Intern pager: [phone], text pages welcome  [place]    I have seen and examined this patient.     I have discussed the findings and exam with the intern and agree with the above note, which I have edited appropriately in [place]. I helped develop the management plan that is described in the resident's note, and I agree with the content.   Doristine Mango, DO PGY-2 Family Medicine Resident

## 2020-03-08 ENCOUNTER — Inpatient Hospital Stay (HOSPITAL_COMMUNITY): Payer: Medicare PPO

## 2020-03-08 ENCOUNTER — Ambulatory Visit: Payer: Medicare PPO | Admitting: Family Medicine

## 2020-03-08 ENCOUNTER — Other Ambulatory Visit (HOSPITAL_COMMUNITY): Payer: Self-pay | Admitting: *Deleted

## 2020-03-08 ENCOUNTER — Encounter (HOSPITAL_COMMUNITY): Payer: Self-pay | Admitting: Student in an Organized Health Care Education/Training Program

## 2020-03-08 DIAGNOSIS — R809 Proteinuria, unspecified: Secondary | ICD-10-CM | POA: Diagnosis present

## 2020-03-08 DIAGNOSIS — W19XXXA Unspecified fall, initial encounter: Secondary | ICD-10-CM

## 2020-03-08 DIAGNOSIS — I4430 Unspecified atrioventricular block: Secondary | ICD-10-CM | POA: Diagnosis present

## 2020-03-08 DIAGNOSIS — E782 Mixed hyperlipidemia: Secondary | ICD-10-CM

## 2020-03-08 DIAGNOSIS — N069 Isolated proteinuria with unspecified morphologic lesion: Secondary | ICD-10-CM

## 2020-03-08 DIAGNOSIS — I361 Nonrheumatic tricuspid (valve) insufficiency: Secondary | ICD-10-CM

## 2020-03-08 DIAGNOSIS — R296 Repeated falls: Secondary | ICD-10-CM

## 2020-03-08 DIAGNOSIS — I69354 Hemiplegia and hemiparesis following cerebral infarction affecting left non-dominant side: Secondary | ICD-10-CM

## 2020-03-08 DIAGNOSIS — R55 Syncope and collapse: Secondary | ICD-10-CM

## 2020-03-08 DIAGNOSIS — E8809 Other disorders of plasma-protein metabolism, not elsewhere classified: Secondary | ICD-10-CM

## 2020-03-08 DIAGNOSIS — I442 Atrioventricular block, complete: Secondary | ICD-10-CM | POA: Diagnosis present

## 2020-03-08 DIAGNOSIS — E1069 Type 1 diabetes mellitus with other specified complication: Secondary | ICD-10-CM

## 2020-03-08 DIAGNOSIS — I340 Nonrheumatic mitral (valve) insufficiency: Secondary | ICD-10-CM

## 2020-03-08 DIAGNOSIS — Z7901 Long term (current) use of anticoagulants: Secondary | ICD-10-CM

## 2020-03-08 HISTORY — DX: Repeated falls: R29.6

## 2020-03-08 HISTORY — DX: Atrioventricular block, complete: I44.2

## 2020-03-08 HISTORY — DX: Syncope and collapse: R55

## 2020-03-08 HISTORY — DX: Unspecified fall, initial encounter: W19.XXXA

## 2020-03-08 HISTORY — DX: Hemiplegia and hemiparesis following cerebral infarction affecting left non-dominant side: I69.354

## 2020-03-08 HISTORY — DX: Proteinuria, unspecified: R80.9

## 2020-03-08 LAB — COMPREHENSIVE METABOLIC PANEL
ALT: 39 U/L (ref 0–44)
AST: 38 U/L (ref 15–41)
Albumin: 2.7 g/dL — ABNORMAL LOW (ref 3.5–5.0)
Alkaline Phosphatase: 98 U/L (ref 38–126)
Anion gap: 13 (ref 5–15)
BUN: 11 mg/dL (ref 8–23)
CO2: 21 mmol/L — ABNORMAL LOW (ref 22–32)
Calcium: 8.6 mg/dL — ABNORMAL LOW (ref 8.9–10.3)
Chloride: 110 mmol/L (ref 98–111)
Creatinine, Ser: 0.91 mg/dL (ref 0.44–1.00)
GFR calc Af Amer: >60 mL/min (ref >60)
GFR calc non Af Amer: >60 mL/min (ref >60)
Glucose, Bld: 148 mg/dL — ABNORMAL HIGH (ref 70–99)
Potassium: 3.8 mmol/L (ref 3.5–5.1)
Sodium: 144 mmol/L (ref 135–145)
Total Bilirubin: 0.9 mg/dL (ref 0.3–1.2)
Total Protein: 5.4 g/dL — ABNORMAL LOW (ref 6.5–8.1)

## 2020-03-08 LAB — ECHOCARDIOGRAM COMPLETE
Height: 67 in
Weight: 3668.45 oz

## 2020-03-08 LAB — GLUCOSE, CAPILLARY
Glucose-Capillary: 186 mg/dL — ABNORMAL HIGH (ref 70–99)
Glucose-Capillary: 185 mg/dL — ABNORMAL HIGH (ref 70–99)
Glucose-Capillary: 320 mg/dL — ABNORMAL HIGH (ref 70–99)
Glucose-Capillary: 226 mg/dL — ABNORMAL HIGH (ref 70–99)

## 2020-03-08 LAB — TROPONIN I (HIGH SENSITIVITY)
Troponin I (High Sensitivity): 33 ng/L — ABNORMAL HIGH (ref <18)
Troponin I (High Sensitivity): 34 ng/L — ABNORMAL HIGH (ref <18)

## 2020-03-08 MED ORDER — POLYETHYLENE GLYCOL 3350 17 G PO PACK
17.0000 g | PACK | Freq: Every day | ORAL | Status: DC
  Administered 2020-03-08 – 2020-03-14 (×4): 17 g via ORAL
  Filled 2020-03-08 (×7): qty 1

## 2020-03-08 MED ORDER — ATORVASTATIN CALCIUM 80 MG PO TABS
80.0000 mg | ORAL_TABLET | Freq: Every day | ORAL | Status: DC
  Administered 2020-03-08 – 2020-03-13 (×6): 80 mg via ORAL
  Filled 2020-03-08 (×6): qty 1

## 2020-03-08 MED ORDER — LOSARTAN POTASSIUM 50 MG PO TABS
50.0000 mg | ORAL_TABLET | Freq: Every day | ORAL | Status: DC
  Administered 2020-03-08 – 2020-03-09 (×2): 50 mg via ORAL
  Filled 2020-03-08 (×2): qty 1

## 2020-03-08 NOTE — Progress Notes (Signed)
Went to patient's bedside twice of PPV but she was not ready.

## 2020-03-08 NOTE — Evaluation (Signed)
Clinical/Bedside Swallow Evaluation Patient Details  Name: Artina Sosna MRN: [id number] Date of Birth: 1951/07/23  Today's Date: 03/08/2020 Time: SLP Start Time (ACUTE ONLY): 1151 SLP Stop Time (ACUTE ONLY): 1202 SLP Time Calculation (min) (ACUTE ONLY): 11 min  Past Medical History:  Past Medical History:  Diagnosis Date  . Abnormal echocardiogram   . Anemia   . Arthritis   . BPPV (benign paroxysmal positional vertigo), right 06/13/2017  . Cerebrovascular accident (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 ([place]) 10/29/2015  . CHF (congestive heart failure) ([place])   . Closed fracture of left proximal humerus 07/08/2019  . Depression   . Dizzy 03/07/2020   bradycardia  . GERD (gastroesophageal reflux disease)   . Hemiparesis affecting left side as late effect of cerebrovascular accident (CVA) ([place]) 03/08/2020  . Hemorrhoids   . History of Falls with injury 03/08/2020   Fall resulting in left humeral fracture  . Hyperlipidemia   . Hypertension   . Hypothyroidism   . Intracranial vascular stenosis   . Seasonal allergies   . Stroke ([place])   . Thyroid cancer ([place])    per pt S/p Total Thyroidectomy with Radioactive Iodine Therapy  . Type 1 diabetes mellitus ([place])    Past Surgical History:  Past Surgical History:  Procedure Laterality Date  . ABDOMINAL ANGIOGRAM N/A 05/14/2012   Procedure: ABDOMINAL ANGIOGRAM;  Surgeon: Laverda Page, MD;  Location: [facility] CATH LAB;  Service: Cardiovascular;  Laterality: N/A;  . ABDOMINAL HYSTERECTOMY  1995   partial  . ANKLE FRACTURE SURGERY Right   . CARDIAC CATHETERIZATION     with coronary angiogram  . COLONOSCOPY N/A 09/09/2014   Procedure: COLONOSCOPY;  Surgeon: Gatha Mayer, MD;  Location: [facility];  Service: Endoscopy;  Laterality: N/A;  . ESOPHAGOGASTRODUODENOSCOPY (EGD) WITH PROPOFOL N/A 12/03/2018   Procedure: ESOPHAGOGASTRODUODENOSCOPY (EGD) WITH PROPOFOL;  Surgeon: Doran Stabler, MD;  Location: [facility];   Service: Gastroenterology;  Laterality: N/A;  . LEFT AND RIGHT HEART CATHETERIZATION WITH CORONARY ANGIOGRAM N/A 05/14/2012   Procedure: LEFT AND RIGHT HEART CATHETERIZATION WITH CORONARY ANGIOGRAM;  Surgeon: Laverda Page, MD;  Location: [facility] CATH LAB;  Service: Cardiovascular;  Laterality: N/A;  . LOOP RECORDER INSERTION N/A 12/23/2018   Procedure: LOOP RECORDER INSERTION;  Surgeon: Constance Haw, MD;  Location: [facility] CV LAB;  Service: Cardiovascular;  Laterality: N/A;  . MINI METER   09/08/2014   ELECTRONIC INSULIN PUMP  . TEE WITHOUT CARDIOVERSION N/A 10/16/2018   Procedure: TRANSESOPHAGEAL ECHOCARDIOGRAM (TEE);  Surgeon: Buford Dresser, MD;  Location: [facility] ENDOSCOPY;  Service: Cardiovascular;  Laterality: N/A;  . THYROIDECTOMY  2006   HPI:  Pt is a 69 y.o. female presenting with syncope . PMH is significant for type 1 diabetes, A. fib, history of CVAs, hypothyroidism, hypertension, hyperlipidemia, history of HFpEF, GERD, depression/anxiety, chronic back pain. CXR: lungs are clear. CT of the head: negative for acute changes.    Assessment / Plan / Recommendation Clinical Impression  Pt was seen for bedside swallow evaluation and she denied a history of dysphagia. Oral mechanism exam was within functional limits. Dentition was limited to reduced mandibular teeth since, per pt, her maxillary dentures are at home. She tolerated all solids and liquids without signs or symptoms of oropharyngeal dysphagia and mastication time was functional despite reduced dentition. It is recommended that the current diet of regular texture solids and thin liquids be continued. Further skilled SLP services are not clinically indicated at this time.  SLP  Visit Diagnosis: Dysphagia, unspecified (R13.10)    Aspiration Risk  No limitations    Diet Recommendation Regular;Thin liquid   Liquid Administration via: Cup;Straw Medication Administration: Whole meds with liquid Supervision: Patient  able to self feed Postural Changes: Seated upright at 90 degrees    Other  Recommendations Oral Care Recommendations: Oral care BID;Patient independent with oral care   Follow up Recommendations None      Frequency and Duration            Prognosis        Swallow Study   General Date of Onset: 03/07/20 HPI: Pt is a 69 y.o. female presenting with syncope . PMH is significant for type 1 diabetes, A. fib, history of CVAs, hypothyroidism, hypertension, hyperlipidemia, history of HFpEF, GERD, depression/anxiety, chronic back pain. CXR: lungs are clear. CT of the head: negative for acute changes.  Type of Study: Bedside Swallow Evaluation Previous Swallow Assessment: None Diet Prior to this Study: Regular;Thin liquids Temperature Spikes Noted: No Respiratory Status: Room air History of Recent Intubation: No Behavior/Cognition: Cooperative;Alert;Pleasant mood Oral Cavity Assessment: Within Functional Limits Oral Care Completed by SLP: No Oral Cavity - Dentition: Dentures, bottom(Upper dentures at home) Vision: Functional for self-feeding Self-Feeding Abilities: Able to feed self Patient Positioning: Upright in bed Baseline Vocal Quality: Normal Volitional Cough: Strong Volitional Swallow: Able to elicit    Oral/Motor/Sensory Function Overall Oral Motor/Sensory Function: Within functional limits   Ice Chips Ice chips: Not tested   Thin Liquid Thin Liquid: Within functional limits Presentation: Straw    Nectar Thick Nectar Thick Liquid: Not tested   Honey Thick Honey Thick Liquid: Not tested   Puree Puree: Within functional limits Presentation: Spoon   Solid     Solid: Within functional limits Presentation: [place] [address] [place], [place] Office number [phone] Pager [phone]  Horton Marshall 03/08/2020,12:03 PM

## 2020-03-08 NOTE — Consult Note (Signed)
Cardiology Consultation:   Patient ID: [name] MRN: [id number]; DOB: [birth year]  Admit date: 03/07/2020 Date of Consult: 03/08/2020  Primary Care Provider: Shirley, [name], DO Primary Cardiologist: Candee Furbish, MD  Primary Electrophysiologist:  None    Patient Profile:   [name] is a 69 y.o. female with a hx of hypertension, diabetes type 1, hyperlipidemia, thyroid cancer status post thyroidectomy, OSA on BiPAP therapy, carotid artery disease, CVA in 216 and 2019, GERD, paroxysmal atrial fibrillation on Eliquis who is being seen today for the evaluation of syncope at the request of Dr.McDiarmid.  History of Present Illness:   [name] has followed with Dr. Marlou Porch over the years and previously followed by Dr. Einar Gip for CHF and CAD.  Cardiac cath 2013 showed normal coronaries and normal EF.  Pulmonary pressures were moderately elevated secondary to obesity.  With history of intracranial stenosis she was put on dual antiplatelet therapy with aspirin and Plavix.  In 2017 she started following with Dr. Marlou Porch. In October 2019 she was found to have a stroke. Cardiology was consulted for possible A. Fib.  Upon further review was felt she had atrial tachycardia with frequent atrial ectopy.  An event monitor was ordered that showed sinus rthym, episodes of sinus bradycardia, nonsustained VT, and frequent atrial ectopy.  Results were reviewed by Dr. Curt Bears who implanted a loop recorder December 2019 for long-term evaluation.  Patient was diagnosed with A. fib 02/09/2019 Dr. Curt Bears started on Eliquis. She started following with the A. fib clinic  Patient was last seen 11/12/2019 in the A. fib clinic was doing well.  Denied chest pain, palpitations, SOB, orthopnea.  She was in sinus rhythm.  Recent ILR check showed 0.5% A. fib burden.  Patient presented to the ED 03/07/2020 for syncopal event and bradycardia.  Patient reported yesterday she went with her sister to the  mall to get a COVID vaccine. Her sister dropped her off at her house and went in wither her. As she was entering the house she was changing from a walker to the rolling walker and as she did that she felt she tripped and caught herself on handle bars of the walker. Her sister called for help from 2 gentleman outside. They came and helped sit the patient down in the walker. She remembers that she felt dizzy, hot, and lightheaded and passed out for about 1 minutes. When she came to she felt foggy. No vision changes, chest pain, weakness, or palpitations. EMS was called and they noted her pulse to be low in the 30s. Patient denies feeling like this before.  In the ED pulse was noted to be 40-45 and irregular with PVCs.  Pressure elevated 173/55, afebrile, 98% O2.  WBC 3.3, hemoglobin 10.8, glucose 199.  AST 68, ALT 54, albumin 3.1. HS troponin 33>69>33>34. EKG showed sinus bradycardia, 43 bpm and blocked PACs. CXR unremarkable. CT head negative for acute process.    Past Medical History:  Diagnosis Date  . Abnormal echocardiogram   . Anemia   . Arthritis   . BPPV (benign paroxysmal positional vertigo), right 06/13/2017  . Cerebrovascular accident (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 ([place]) 10/29/2015  . CHF (congestive heart failure) ([place])   . Closed fracture of left proximal humerus 07/08/2019  . Depression   . Dizzy 03/07/2020   bradycardia  . GERD (gastroesophageal reflux disease)   . Hemiparesis affecting left side as late effect of cerebrovascular accident (CVA) ([place]) 03/08/2020  . Hemorrhoids   .  History of Falls with injury 03/08/2020   Fall resulting in left humeral fracture  . Hyperlipidemia   . Hypertension   . Hypothyroidism   . Intracranial vascular stenosis   . Seasonal allergies   . Stroke ([place])   . Thyroid cancer ([place])    per pt S/p Total Thyroidectomy with Radioactive Iodine Therapy  . Type 1 diabetes mellitus ([place])     Past Surgical History:  Procedure Laterality Date    . ABDOMINAL ANGIOGRAM N/A 05/14/2012   Procedure: ABDOMINAL ANGIOGRAM;  Surgeon: Laverda Page, MD;  Location: [facility] CATH LAB;  Service: Cardiovascular;  Laterality: N/A;  . ABDOMINAL HYSTERECTOMY  1995   partial  . ANKLE FRACTURE SURGERY Right   . CARDIAC CATHETERIZATION     with coronary angiogram  . COLONOSCOPY N/A 09/09/2014   Procedure: COLONOSCOPY;  Surgeon: Gatha Mayer, MD;  Location: [place];  Service: Endoscopy;  Laterality: N/A;  . ESOPHAGOGASTRODUODENOSCOPY (EGD) WITH PROPOFOL N/A 12/03/2018   Procedure: ESOPHAGOGASTRODUODENOSCOPY (EGD) WITH PROPOFOL;  Surgeon: Doran Stabler, MD;  Location: [place];  Service: Gastroenterology;  Laterality: N/A;  . LEFT AND RIGHT HEART CATHETERIZATION WITH CORONARY ANGIOGRAM N/A 05/14/2012   Procedure: LEFT AND RIGHT HEART CATHETERIZATION WITH CORONARY ANGIOGRAM;  Surgeon: Laverda Page, MD;  Location: [facility] CATH LAB;  Service: Cardiovascular;  Laterality: N/A;  . LOOP RECORDER INSERTION N/A 12/23/2018   Procedure: LOOP RECORDER INSERTION;  Surgeon: Constance Haw, MD;  Location: [place] CV LAB;  Service: Cardiovascular;  Laterality: N/A;  . MINI METER   09/08/2014   ELECTRONIC INSULIN PUMP  . TEE WITHOUT CARDIOVERSION N/A 10/16/2018   Procedure: TRANSESOPHAGEAL ECHOCARDIOGRAM (TEE);  Surgeon: Buford Dresser, MD;  Location: [facility] ENDOSCOPY;  Service: Cardiovascular;  Laterality: N/A;  . THYROIDECTOMY  2006     Home Medications:  Prior to Admission medications   Medication Sig Start Date End Date Taking? Authorizing Provider  ACCU-CHEK GUIDE test strip  01/05/20  Yes [provider]  acetaminophen (TYLENOL) 500 MG tablet Take 1,000 mg by mouth as needed for mild pain.   Yes [provider]  apixaban (ELIQUIS) 5 MG TABS tablet Take 1 tablet (5 mg total) by mouth 2 (two) times daily. 01/14/20  Yes Fenton, Clint R, PA  atorvastatin (LIPITOR) 80 MG tablet Take 1 tablet (80 mg total) by mouth  daily. Patient taking differently: Take 80 mg by mouth every evening.  06/02/19  Yes Enid Derry, [name], DO  Blood Glucose Monitoring Suppl (ACCU-CHEK GUIDE) w/Device KIT  01/15/20  Yes [provider]  CRANBERRY EXTRACT PO Take 1 capsule by mouth daily.   Yes [provider]  fexofenadine (ALLEGRA) 30 MG tablet Take 30 mg by mouth daily as needed (allergies).    Yes [provider]  FLUoxetine (PROZAC) 10 MG capsule Take 1 capsule (10 mg total) by mouth daily. Patient taking differently: Take 10 mg by mouth daily. Take along with 67m tablet per patient 12/29/19  Yes FGuadalupe Dawn MD  FLUoxetine (PROZAC) 20 MG tablet Take 1 tablet (20 mg total) by mouth daily. Take with 155mfor 307motal. Patient taking differently: Take 20 mg by mouth daily. Take along with 73m80mblet per patient 12/29/19  Yes FletGuadalupe Dawn  gabapentin (NEURONTIN) 300 MG capsule TAKE ONE CAPSULE BY MOUTH AT BEDTIME 12/23/19  Yes ShirEnid DerryrdMartinique  insulin aspart (NOVOLOG) 100 UNIT/ML injection To be used in patient's insulin pump 01/30/20  Yes ShirEnid DerryrdMartinique  Insulin Human (INSULIN PUMP)  SOLN Inject 1 each into the skin continuous. Novolog insulin 01/02/20  Yes Guadalupe Dawn, MD  levothyroxine (SYNTHROID, LEVOTHROID) 175 MCG tablet Take 175 mcg by mouth daily before breakfast.  06/06/17  Yes [provider]  losartan (COZAAR) 25 MG tablet TAKE 1 TABLET BY MOUTH AT BEDTIME Patient taking differently: Take 25 mg by mouth at bedtime.  02/19/20  Yes Enid Derry, [name], DO  metoprolol succinate (TOPROL-XL) 100 MG 24 hr tablet Take 1 tablet (100 mg total) by mouth daily. Take with or immediately following a meal. 11/12/19  Yes Fenton, Clint R, PA  pantoprazole (PROTONIX) 40 MG tablet Take 1 tablet (40 mg total) by mouth 2 (two) times daily before a meal. 12/29/19  Yes Guadalupe Dawn, MD  potassium chloride (K-DUR) 10 MEQ tablet Take 1 tablet by mouth on days toresmide is taken Patient taking  differently: Take 10 mEq by mouth daily as needed (For low potassium). Take along with torsemide when taken 03/20/19  Yes Fenton, Clint R, PA  torsemide (DEMADEX) 10 MG tablet TAKE 1 TABLET BY MOUTH DAILY AS NEEDED FOR FLUID Patient taking differently: Take 10 mg by mouth daily as needed (fluid). Take along with potassium when taken 03/14/19  Yes Enid Derry, [name], DO  HYDROcodone-acetaminophen (NORCO/VICODIN) 5-325 MG tablet Take 2 tablets by mouth every 4 (four) hours as needed. Patient not taking: Reported on 03/07/2020 07/06/19   Recardo Evangelist, PA-C  loperamide (IMODIUM) 2 MG capsule TAKE 1 CAPSULE( 2 MG TOTAL) BY MOUTH FOUR TIMES DAILY AS NEEDED FOR DIARRHEA OR LOOSE STOOLS Patient not taking: Reported on 03/07/2020 12/11/19   Shirley, [name], DO  ondansetron (ZOFRAN-ODT) 4 MG disintegrating tablet DISSOLVE 1 TABLET BY MOUTH EVERY 8 HOURS AS NEEDED FOR NAUSEA AND VOMITING Patient not taking: Reported on 03/07/2020 09/16/19   Shirley, [name], DO  oxyCODONE (OXY IR/ROXICODONE) 5 MG immediate release tablet Take 1 tablet (5 mg total) by mouth every 8 (eight) hours as needed for severe pain. Patient not taking: Reported on 03/07/2020 07/23/19   Meredith Pel, MD  losartan (COZAAR) 25 MG tablet Take 1 tablet (25 mg total) by mouth at bedtime. 04/29/19   Shirley, [name], DO  ondansetron (ZOFRAN-ODT) 4 MG disintegrating tablet DISSOLVE ONE TABLET BY MOUTH EVERY 8 HOURS AS NEEDED FOR NAUSEA AND VOMITING 09/02/19   Shirley, [name], DO  pantoprazole (PROTONIX) 40 MG tablet Take 1 tablet (40 mg total) by mouth daily. 12/09/18   Shirley, [name], DO    Inpatient Medications: Scheduled Meds: . ALPRAZolam  0.25 mg Oral Once  . apixaban  5 mg Oral BID  . atorvastatin  80 mg Oral q1800  . FLUoxetine  30 mg Oral Daily  . gabapentin  300 mg Oral QHS  . insulin pump   Subcutaneous TID WC, HS, 0200  . levothyroxine  175 mcg Oral Q0600  . polyethylene glycol  17 g Oral Daily   Continuous Infusions:  PRN  Meds: acetaminophen **OR** acetaminophen  Allergies:    Allergies  Allergen Reactions  . Latex Rash    Social History:   Social History   Socioeconomic History  . Marital status: Divorced    Spouse name: Not on file  . Number of children: 1  . Years of education: Not on file  . Highest education level: Not on file  Occupational History  . Occupation: retired Pharmacist, hospital  Tobacco Use  . Smoking status: Never Smoker  . Smokeless tobacco: Never Used  Substance and Sexual Activity  . Alcohol use: No  .  Drug use: No  . Sexual activity: Not on file  Other Topics Concern  . Not on file  Social History Narrative   Retired Pharmacist, hospital, high school teacher taught mass. One son, helping to care for her granddaughter. No caffeine. Updated 07/20/2014.   Social Determinants of Health   Financial Resource Strain:   . Difficulty of Paying Living Expenses:   Food Insecurity:   . Worried About Charity fundraiser in the Last Year:   . Arboriculturist in the Last Year:   Transportation Needs:   . Film/video editor (Medical):   Marland Kitchen Lack of Transportation (Non-Medical):   Physical Activity:   . Days of Exercise per Week:   . Minutes of Exercise per Session:   Stress:   . Feeling of Stress :   Social Connections:   . Frequency of Communication with Friends and Family:   . Frequency of Social Gatherings with Friends and Family:   . Attends Religious Services:   . Active Member of Clubs or Organizations:   . Attends Archivist Meetings:   Marland Kitchen Marital Status:   Intimate Partner Violence:   . Fear of Current or Ex-Partner:   . Emotionally Abused:   Marland Kitchen Physically Abused:   . Sexually Abused:     Family History:   Family History  Problem Relation Age of Onset  . Heart disease Mother   . Hyperlipidemia Mother   . Hypertension Mother   . Renal Disease Mother        insufficiency  . Heart attack Mother        in his 2's  . Early death Father        Head Injury at Work  .  Hyperlipidemia Father   . Hypertension Father   . Stroke Son   . Diabetes Maternal Aunt   . Prostate cancer Paternal Uncle   . Heart disease Maternal Aunt   . Heart failure Maternal Grandfather   . Heart disease Maternal Grandfather   . Heart attack Maternal Grandfather        Died in his 33's  . Breast cancer Neg Hx      ROS:  Please see the history of present illness.  All other ROS reviewed and negative.     Physical Exam/Data:   Vitals:   03/07/20 2000 03/07/20 2027 03/08/20 0020 03/08/20 0346  BP: (!) 171/55 (!) 157/76  (!) 174/56  Pulse: (!) 46 (!) 48  (!) 43  Resp: 20 20 19    Temp:  98 F (36.7 C)  98 F (36.7 C)  TempSrc:  Oral  Oral  SpO2: 91% 98%  97%  Weight:    104 kg  Height:        Intake/Output Summary (Last 24 hours) at 03/08/2020 1217 Last data filed at 03/08/2020 0551 Gross per 24 hour  Intake 360 ml  Output 1500 ml  Net -1140 ml   Last 3 Weights 03/08/2020 03/07/2020 11/12/2019  Weight (lbs) 229 lb 4.5 oz 231 lb 7.7 oz 242 lb  Weight (kg) 104 kg 105 kg 109.77 kg     Body mass index is 35.91 kg/m.  General:  Well nourished, well developed, in no acute distress HEENT: normal Lymph: no adenopathy Neck: no JVD Endocrine:  No thryomegaly Vascular: No carotid bruits; FA pulses 2+ bilaterally without bruits  Cardiac:  normal S1, S2; bradycardia; no murmur  Lungs:  clear to auscultation bilaterally, no wheezing, rhonchi or rales  Abd: soft,  nontender, no hepatomegaly  Ext: Trace edema Musculoskeletal:  No deformities, BUE and BLE strength normal and equal Skin: warm and dry  Neuro:  CNs 2-12 intact, no focal abnormalities noted Psych:  Normal affect   EKG:  The EKG was personally reviewed and demonstrates:  Sinus bradycardia, 43bpm, blocked PAC Telemetry:  Telemetry was personally reviewed and demonstrates:  Sinus bradycardia, HR 40-50s some in the 30s, blocked PACs; PVCs; 3 beats NSVT  Relevant CV Studies:  Echo 03/07/2020 1. Left ventricular  ejection fraction, by estimation, is 60 to 65%. Left  ventricular ejection fraction by PLAX is 63 %. The left ventricle has  normal function. The left ventricle has no regional wall motion  abnormalities. Left ventricular diastolic  parameters are indeterminate.  2. Right ventricular systolic function is normal. The right ventricular  size is normal. There is moderately elevated pulmonary artery systolic  pressure.  3. Left atrial size was mildly dilated.  4. The mitral valve is normal in structure. Mild mitral valve  regurgitation. No evidence of mitral stenosis.  5. Tricuspid valve regurgitation is mild to moderate.  6. The aortic valve is tricuspid. Aortic valve regurgitation is not  visualized. No aortic stenosis is present.  7. The inferior vena cava is dilated in size with >50% respiratory  variability, suggesting right atrial pressure of 8 mmHg.   Laboratory Data:  High Sensitivity Troponin:   Recent Labs  Lab 03/07/20 1505 03/07/20 1934 03/08/20 0712 03/08/20 0842  TROPONINIHS 30* 69* 33* 34*     Chemistry Recent Labs  Lab 03/07/20 1505 03/08/20 0338  NA 141 144  K 3.5 3.8  CL 110 110  CO2 26 21*  GLUCOSE 189* 148*  BUN 12 11  CREATININE 0.99 0.91  CALCIUM 8.6* 8.6*  GFRNONAA 58* >60  GFRAA >60 >60  ANIONGAP 5 13    Recent Labs  Lab 03/07/20 1505 03/08/20 0338  PROT 6.1* 5.4*  ALBUMIN 3.1* 2.7*  AST 68* 38  ALT 54* 39  ALKPHOS 107 98  BILITOT 0.5 0.9   Hematology Recent Labs  Lab 03/07/20 1505  WBC 3.3*  RBC 3.77*  HGB 10.8*  HCT 34.7*  MCV 92.0  MCH 28.6  MCHC 31.1  RDW 12.8  PLT 168   BNPNo results for input(s): BNP, PROBNP in the last 168 hours.  DDimer No results for input(s): DDIMER in the last 168 hours.   Radiology/Studies:  CT HEAD WO CONTRAST  Result Date: 03/07/2020 CLINICAL DATA:  Bradycardia and syncope. EXAM: CT HEAD WITHOUT CONTRAST TECHNIQUE: Contiguous axial images were obtained from the base of the skull  through the vertex without intravenous contrast. COMPARISON:  October 13, 2018 FINDINGS: Brain: There is mild cerebral atrophy with widening of the extra-axial spaces and ventricular dilatation. There are areas of decreased attenuation within the white matter tracts of the supratentorial brain, consistent with microvascular disease changes. Small chronic lacunar infarcts are seen within the pons. Vascular: No hyperdense vessel or unexpected calcification. Skull: Normal. Negative for fracture or focal lesion. Sinuses/Orbits: No acute finding. Other: None. IMPRESSION: 1. Generalized cerebral atrophy. 2. No acute intracranial abnormality. Electronically Signed   By: Virgina Norfolk M.D.   On: 03/07/2020 18:05   DG Chest Port 1 View  Result Date: 03/07/2020 CLINICAL DATA:  Bradycardia EXAM: PORTABLE CHEST 1 VIEW COMPARISON:  04/15/2019 FINDINGS: Cardiomegaly. Implantable loop recorder. Both lungs are clear. The visualized skeletal structures are unremarkable. IMPRESSION: Cardiomegaly without acute abnormality of the lungs in AP portable projection. Electronically Signed  By: Eddie Candle M.D.   On: 03/07/2020 15:03   ECHOCARDIOGRAM COMPLETE  Result Date: 03/08/2020    ECHOCARDIOGRAM REPORT   Patient Name:   [name] Ortwein Date of Exam: 03/08/2020 Medical Rec #:  [id number]                  Height:       67.0 in Accession #:    [id number]                 Weight:       229.3 lb Date of Birth:  1951-09-24                   BSA:          2.143 m Patient Age:    68 years                   BP:           174/56 mmHg Patient Gender: F                          HR:           42 bpm. Exam Location:  Inpatient Procedure: 2D Echo, Cardiac Doppler and Color Doppler Indications:    R94.31 Abnormal EKG  History:        Patient has prior history of Echocardiogram examinations, most                 recent 10/16/2018. CHF, Stroke; Risk Factors:Hypertension,                 Diabetes, Dyslipidemia and GERD.  Hypothyroidism.  Sonographer:    Jonelle Sidle Dance Referring Phys: [place]  1. Left ventricular ejection fraction, by estimation, is 60 to 65%. Left ventricular ejection fraction by PLAX is 63 %. The left ventricle has normal function. The left ventricle has no regional wall motion abnormalities. Left ventricular diastolic parameters are indeterminate.  2. Right ventricular systolic function is normal. The right ventricular size is normal. There is moderately elevated pulmonary artery systolic pressure.  3. Left atrial size was mildly dilated.  4. The mitral valve is normal in structure. Mild mitral valve regurgitation. No evidence of mitral stenosis.  5. Tricuspid valve regurgitation is mild to moderate.  6. The aortic valve is tricuspid. Aortic valve regurgitation is not visualized. No aortic stenosis is present.  7. The inferior vena cava is dilated in size with >50% respiratory variability, suggesting right atrial pressure of 8 mmHg. FINDINGS  Left Ventricle: Left ventricular ejection fraction, by estimation, is 60 to 65%. Left ventricular ejection fraction by PLAX is 63 % The left ventricle has normal function. The left ventricle has no regional wall motion abnormalities. The left ventricular internal cavity size was normal in size. There is no left ventricular hypertrophy. Left ventricular diastolic parameters are indeterminate. Right Ventricle: The right ventricular size is normal. No increase in right ventricular wall thickness. Right ventricular systolic function is normal. There is moderately elevated pulmonary artery systolic pressure. The tricuspid regurgitant velocity is 3.30 m/s, and with an assumed right atrial pressure of 8 mmHg, the estimated right ventricular systolic pressure is 34.1 mmHg. Left Atrium: Left atrial size was mildly dilated. Right Atrium: Right atrial size was normal in size. Pericardium: There is no evidence of pericardial effusion. Mitral Valve: The mitral valve is  normal in structure. There is mild thickening of the  mitral valve leaflet(s). There is mild calcification of the mitral valve leaflet(s). Normal mobility of the mitral valve leaflets. Mild mitral valve regurgitation. No evidence of mitral valve stenosis. Tricuspid Valve: The tricuspid valve is normal in structure. Tricuspid valve regurgitation is mild to moderate. No evidence of tricuspid stenosis. Aortic Valve: The aortic valve is tricuspid. . There is mild thickening and mild calcification of the aortic valve. Aortic valve regurgitation is not visualized. No aortic stenosis is present. There is mild thickening of the aortic valve. There is mild calcification of the aortic valve. Pulmonic Valve: The pulmonic valve was normal in structure. Pulmonic valve regurgitation is mild. No evidence of pulmonic stenosis. Aorta: The aortic root is normal in size and structure. Venous: The inferior vena cava is dilated in size with greater than 50% respiratory variability, suggesting right atrial pressure of 8 mmHg. IAS/Shunts: No atrial level shunt detected by color flow Doppler.  LEFT VENTRICLE PLAX 2D LV EF:         Left ventricular ejection fraction by PLAX is 63 % LVIDd:         4.99 cm LVIDs:         3.30 cm LV PW:         1.00 cm LV IVS:        1.03 cm LVOT diam:     2.10 cm LV SV:         64 LV SV Index:   30 LVOT Area:     3.46 cm  RIGHT VENTRICLE            IVC RV Basal diam:  2.72 cm    IVC diam: 2.31 cm RV S prime:     7.90 cm/s TAPSE (M-mode): 2.1 cm LEFT ATRIUM             Index       RIGHT ATRIUM           Index LA diam:        5.10 cm 2.38 cm/m  RA Area:     17.00 cm LA Vol (A2C):   71.4 ml 33.32 ml/m RA Volume:   45.90 ml  21.42 ml/m LA Vol (A4C):   62.7 ml 29.26 ml/m LA Biplane Vol: 71.2 ml 33.22 ml/m  AORTIC VALVE LVOT Vmax:   61.30 cm/s LVOT Vmean:  41.200 cm/s LVOT VTI:    0.186 m  AORTA Ao Root diam: 3.00 cm Ao Asc diam:  3.20 cm MITRAL VALVE                TRICUSPID VALVE MV Area (PHT): 1.98 cm      TR Peak grad:   43.6 mmHg MV Decel Time: 383 msec     TR Vmax:        330.00 cm/s MV E velocity: 107.00 cm/s                             SHUNTS                             Systemic VTI:  0.19 m                             Systemic Diam: 2.10 cm Skeet Latch MD Electronically signed by Skeet Latch MD Signature Date/Time: 03/08/2020/12:05:33 PM    Final    {  Assessment and Plan:   Syncope Patient had mechanical fall followed by syncopal episode after sitting down on her walker with prodrome dizziness, lightheadedness, and feeling hot. No chest pain or palpitations. LOC 1 minute. Did not hit her head.  - ?Might be vasovagal given prodrome - In the ED patient was hypertensive systolics in the 840A.  - CT head negative for acute process - EKG with sinus bradycardia, 43 bpm and a blocked PAC - Echo showed EF 60-65%, no WMA, mild MR, mild to mod TR, normal RV function, RA pressure 14mHg - COVID negative - TSH 2.9 - can interrogate ILR - further work-up per IM  Bradycardia - HR 40s on initial exam. EKG shows blocked PAC.  - BB held on admission - Telemetry shows sinus bradycardia with frequently blocked PACs - Might need EP consult for AAA  Paroxysmal Afib - on Eliquis for a/c - CHADSVASC = 5 (female, age, CVA, DM) - has ILR since 2019. recent study with 0.5% afib burden with no other arrythmias - Was on Toprol for rate control>>held for bradycardia  HTN - at baseline was on Losartan 211m Toprol 10074mtorsemide 72m37mheld on admission - Toprol held for bradycardia - Patient says she takes her medications daily and normall presures are 120-130s. Pressures significantly elevated>>would restart home Losartan , might need addition of amlodipine if still elevated  Chronic diasolic HF - EF preserved - on torsemide 72mg23mhome>>held - Trace edema on exam  DM1 - A1C 8.4  Hypothyroidism - synthroid  H/O of stroke, cryptogenic - CT head negative for acute process  HLD -  continue atorvastatin 80mg 43m - 2/2 to obesity  For questions or updates, please contact [facility] Please consult [email or web address] for contact info under     Signed, Daci Stubbe H FurtNinfa Meeker  03/08/2020 12:17 PM

## 2020-03-08 NOTE — Progress Notes (Signed)
  Echocardiogram 2D Echocardiogram has been performed.  [name] 03/08/2020, 9:50 AM

## 2020-03-08 NOTE — Progress Notes (Signed)
Family Medicine Teaching Service Daily Progress Note Intern Pager: [phone]  Patient name: [name] Medical record number: [id number] Date of birth: May 07, 1951 Age: 69 y.o. Gender: female  Primary Care Provider: Shirley, [name], DO Consultants: Cardiology Code Status: FULL   Pt Overview and Major Events to Date:  3/14: Admitted  Assessment and Plan:  Symptomatic bradycardia-unclear etiology Pt endorses feeling dizzy this morning. EKG shows Type 2 HB. Trop [id number] -Continuous telemetry and pulse oximetry  -Vitals per floor routine -F/u Cardiology recommendations  -F/u Echo -Orthostatic Bps -Up with assistance -SLP eval -PT/OT -AM CBC and CMP  H/o HFpEF Last echo 10/16/2018, EF 60 to 65%. -Home meds include torsemide 10 mg as needed for swelling -Hold home torsemide -FU echo from today  H/o CVA with residual left sided weakness  CT head: no acute abnormalities  -Neurological status  Hypothyroidism Last TSH in 2019-2.47 Home meds: Levothyroxine 175 mcg once daily except Sundays - TSH today, consider T3-T4 if necessary -Continue Levothyroxine  T1DM CBGs 185, 186. Last A1c 8.4 on admission.  Sees Dr. Debbora Presto for endocrinology. Patient endorses peripheral neuropathy. Could also have autonomic reflex issues contributing to fall. Home meds: Insulin aspart via patient's insulin pump -Continue insulin pump - monitor CBGs  Normocytic anemia  leukopenia Hgb 10.8, WBC 3.3 on admission -Montitor with daily CBC  Afib HR 50s Home meds include: Eliquis 5 mg BID, metoprolol 100 mg XL once daily -Hold metoprolol -Continue Eliquis   HTN SBP 159-190 and DBP 62-67, denies neurological symptoms Home meds: Metoprolol 100 mg once daily -Hold metoprolol due to bradycardia -Cardiology following, appreciate recommendations -Consider starting hydralazine for isolated BP control and less effect on HR -Monitor Bps  GERD Home meds include Protonix 40  mg -Continue Protonix  Depression- home meds: fluoxetine 30mg  daily -Continue fluoxetine  Hypercholesterolemia Home meds include atorvastatin 80 mg -Continue statin  Elevated transaminases AST 68>38, ALT 54>39 -CMP a.m.  Chronic Back Pain Home meds include: Gabapentin 300mg  qHS -Continue gabapentin 300 mg  FEN/GI: Heart healthy, Protonix Prophylaxis: Lovenox   Disposition: IP for next 1-2 days   Subjective:  Endorses feeling dizzy this morning.   Objective: Temp:  [98 F (36.7 C)] 98 F (36.7 C) (03/15 0346) Pulse Rate:  [43-115] 43 (03/15 0346) Resp:  [15-22] 19 (03/15 0020) BP: (140-190)/(55-129) 174/56 (03/15 0346) SpO2:  [91 %-100 %] 97 % (03/15 0346) Weight:  [104 kg] 104 kg (03/15 0346)   Physical Exam: General: well appearing 69 yr old female, no acute distress Cardiovascular: S1 and S2 present, irregular rhythm, long pauses  Respiratory: CTAB anteriorly, no crackles or wheeze Abdomen: abdomen soft non tender, bowel sounds present  Extremities: calves soft non tender  Laboratory: Recent Labs  Lab 03/07/20 1505  WBC 3.3*  HGB 10.8*  HCT 34.7*  PLT 168   Recent Labs  Lab 03/07/20 1505 03/08/20 0338  NA 141 144  K 3.5 3.8  CL 110 110  CO2 26 21*  BUN 12 11  CREATININE 0.99 0.91  CALCIUM 8.6* 8.6*  PROT 6.1* 5.4*  BILITOT 0.5 0.9  ALKPHOS 107 98  ALT 54* 39  AST 68* 38  GLUCOSE 189* 148*     Imaging/Diagnostic Tests:   Lattie Haw, MD 03/08/2020, 2:59 PM PGY-1, [place] Intern pager: [phone], text pages welcome

## 2020-03-08 NOTE — Progress Notes (Signed)
RT placed patient on CPAP HS auto titrate. NO O2 bleed in  Needed. Patient tolerating well at this time.

## 2020-03-08 NOTE — Progress Notes (Signed)
Patient had a 2.10 seconds pause and HR 28 per CCMD. She was asymptomatic. On call FM intern Dr. Chauncey Reading made aware. No new orders; will continue to monitor patient.

## 2020-03-08 NOTE — Progress Notes (Signed)
Inpatient Diabetes Program Recommendations  AACE/ADA: New Consensus Statement on Inpatient Glycemic Control (2015)  Target Ranges:  Prepandial:   less than 140 mg/dL      Peak postprandial:   less than 180 mg/dL (1-2 hours)      Critically ill patients:  140 - 180 mg/dL   Lab Results  Component Value Date   GLUCAP 185 (H) 03/08/2020   HGBA1C 8.4 (H) 03/07/2020    Review of Glycemic Control  Diabetes history: DM type 1 Outpatient Diabetes medications: Medtronic insulin pump, Novolog insulin, Dexcom CGM  Current orders for Inpatient glycemic control: insulin pump order set  Sees [name].  Spoke with patient regarding diabetes and home regimen for diabetes management. Patient has insulin pump in the bed with her and connected to insertion site right lower abd. Dexcom CGM 2 inches from insulin pump site. Both sites changed 3/15. Dexcom sensor changed every 10 days, insulin pump site changed every 4 days.  Pt does not have extra supplies at bedside since she changed her sites today, however family is close by to bring it if she needs it.  Current insulin pump settings are as follows:  Basal insulin  12A-3A  0.575 units/hour 3A-9A  0.775 units/hour 9A-12P 1.30 units/hour 12P-1:30P 0.95 units/hour 1:30P-3P 1.15 units/hour 3P-6P  1.10 units/hour 6P-7:30P 1.15 units/hour 7:30P-12A 0.95 units/hour  Total daily basal insulin: 22.75 units/24 hours  Carb Coverage 1:6 1 unit for every 6 grams of carbohydrates  Insulin Sensitivity 12A-8A  1:60 1 units drops glucose 60 mg/dl 8A-8P  1:50 8P-12A 1:60   Target Glucose Goals 140 mg/dl  4 hour active insulin time  Thanks,  Tama Headings RN, MSN, BC-ADM Inpatient Diabetes Coordinator Team Pager [phone] (8a-5p)

## 2020-03-09 ENCOUNTER — Other Ambulatory Visit (HOSPITAL_COMMUNITY): Payer: Self-pay

## 2020-03-09 DIAGNOSIS — I480 Paroxysmal atrial fibrillation: Secondary | ICD-10-CM

## 2020-03-09 DIAGNOSIS — Z7901 Long term (current) use of anticoagulants: Secondary | ICD-10-CM

## 2020-03-09 DIAGNOSIS — R001 Bradycardia, unspecified: Secondary | ICD-10-CM

## 2020-03-09 DIAGNOSIS — E1159 Type 2 diabetes mellitus with other circulatory complications: Secondary | ICD-10-CM

## 2020-03-09 DIAGNOSIS — I10 Essential (primary) hypertension: Secondary | ICD-10-CM

## 2020-03-09 DIAGNOSIS — I491 Atrial premature depolarization: Secondary | ICD-10-CM

## 2020-03-09 DIAGNOSIS — I5032 Chronic diastolic (congestive) heart failure: Secondary | ICD-10-CM

## 2020-03-09 LAB — BASIC METABOLIC PANEL
Anion gap: 9 (ref 5–15)
BUN: 11 mg/dL (ref 8–23)
CO2: 24 mmol/L (ref 22–32)
Calcium: 8.6 mg/dL — ABNORMAL LOW (ref 8.9–10.3)
Chloride: 107 mmol/L (ref 98–111)
Creatinine, Ser: 1.05 mg/dL — ABNORMAL HIGH (ref 0.44–1.00)
GFR calc Af Amer: >60 mL/min (ref >60)
GFR calc non Af Amer: 54 mL/min — ABNORMAL LOW (ref >60)
Glucose, Bld: 215 mg/dL — ABNORMAL HIGH (ref 70–99)
Potassium: 3.9 mmol/L (ref 3.5–5.1)
Sodium: 140 mmol/L (ref 135–145)

## 2020-03-09 LAB — GLUCOSE, CAPILLARY
Glucose-Capillary: 140 mg/dL — ABNORMAL HIGH (ref 70–99)
Glucose-Capillary: 189 mg/dL — ABNORMAL HIGH (ref 70–99)
Glucose-Capillary: 193 mg/dL — ABNORMAL HIGH (ref 70–99)
Glucose-Capillary: 237 mg/dL — ABNORMAL HIGH (ref 70–99)
Glucose-Capillary: 238 mg/dL — ABNORMAL HIGH (ref 70–99)

## 2020-03-09 LAB — MAGNESIUM: Magnesium: 1.5 mg/dL — ABNORMAL LOW (ref 1.7–2.4)

## 2020-03-09 MED ORDER — LOSARTAN POTASSIUM 50 MG PO TABS
50.0000 mg | ORAL_TABLET | Freq: Once | ORAL | Status: AC
  Administered 2020-03-09: 12:00:00 50 mg via ORAL
  Filled 2020-03-09: qty 1

## 2020-03-09 MED ORDER — LOSARTAN POTASSIUM 50 MG PO TABS
100.0000 mg | ORAL_TABLET | Freq: Every day | ORAL | Status: DC
  Administered 2020-03-10 – 2020-03-14 (×5): 100 mg via ORAL
  Filled 2020-03-09 (×5): qty 2

## 2020-03-09 MED ORDER — MAGNESIUM SULFATE 2 GM/50ML IV SOLN
2.0000 g | Freq: Once | INTRAVENOUS | Status: AC
  Administered 2020-03-09: 12:00:00 2 g via INTRAVENOUS
  Filled 2020-03-09: qty 50

## 2020-03-09 MED ORDER — LORATADINE 10 MG PO TABS
10.0000 mg | ORAL_TABLET | Freq: Every day | ORAL | Status: DC | PRN
  Administered 2020-03-10 – 2020-03-14 (×2): 10 mg via ORAL
  Filled 2020-03-09 (×3): qty 1

## 2020-03-09 MED ORDER — PANTOPRAZOLE SODIUM 40 MG PO TBEC
40.0000 mg | DELAYED_RELEASE_TABLET | Freq: Two times a day (BID) | ORAL | Status: DC
  Administered 2020-03-09 – 2020-03-14 (×10): 40 mg via ORAL
  Filled 2020-03-09 (×10): qty 1

## 2020-03-09 NOTE — Discharge Summary (Signed)
[facility] Discharge Summary  Patient name: [name] Medical record number: [id number] Date of birth: 24-May-1951 Age: 69 y.o. Gender: female Date of Admission: 03/07/2020  Date of Discharge: 03/14/2020 Admitting Physician: Richarda Osmond, DO  Primary Care Provider: Shirley, [name], DO Consultants: Cardiology  Indication for Hospitalization: Symptomatic bradycardia  Discharge Diagnoses/Problem List:  Symptomatic bradycardia Hypothyroidism Type 1 diabetes HFpEF History of CVA with residual left-sided weakness Normocytic anemia A. Fib Hypertension GERD  Disposition: SNF  Discharge Condition:  Stable, improved   Discharge Exam:   Physical Exam: General: NAD, non-toxic, well-appearing, sleeping comfortably   Cardiovascular: irregular, bradycardic. No BLEE Respiratory: No IWOB.  Extremities: Warm and well perfused.  Integumentary: No obvious rashes, lesions, trauma on general exam.  Brief Hospital Course:   Symptomatic bradycardia  Pt was admitted following symptomatic bradycardia event. Full details of this can be found in the H&P. EKD showed type 2 AV block. Loop recorder was interrogated for bradyrhythmias which showed frequent blocked PACs.  Echo showed EF of 60 to 65%, indeterminate diastolic parameters, moderately elevated pulmonary artery systolic pressure.  Patient was seen by cardiology and electrophysiology.  Metoprolol dose was adjusted, ultimately patient was discharged on metoprolol 25 mg daily.  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  Medical therapy was recommended by cardiology.  EP noted they will continue to monitor her heart rhythm through her Linq monitor.  Noted no further EP was work-up was needed at this time.  Did note that they may add antiarrhythmic's as outpatient if indicated.  Patient's heart rate  remained stable throughout her hospitalization and she had no further episodes of syncope.  At the time of discharge her vital signs were stable and she was without complaint.  The patient's other chronic medical issues were addressed as needed throughout her hospitalization without making any changes to her home regimen.  Issues for Follow Up:  1. Ensure follow up with cardiology. 2. Patient's metoprolol was decreased to 25 mg due to low BP.  She should continue at this dose. 3. K goal greater than 4, mag goal greater than 2.  Recheck within 1 week.  Significant Procedures: None  Significant Labs and Imaging:  Recent Labs  Lab 03/11/20 0417 03/13/20 0352  WBC 4.4 4.2  HGB 10.9* 11.8*  HCT 34.2* 37.1  PLT 178 198   Recent Labs  Lab 03/08/20 0338 03/08/20 0338 03/09/20 0801 03/09/20 0801 03/10/20 0431 03/10/20 0431 03/11/20 0417 03/11/20 0417 03/12/20 0151 03/12/20 0151 03/13/20 0352 03/14/20 0843  NA 144   < > 140   < > 139  --  139  --  140  --  138 138  K 3.8   < > 3.9   < > 3.7   < > 3.7   < > 3.5   < > 3.5 3.8  CL 110   < > 107   < > 102  --  104  --  103  --  102 106  CO2 21*   < > 24   < > 25  --  26  --  26  --  26 25  GLUCOSE 148*   < > 215*   < > 205*  --  217*  --  215*  --  267* 236*  BUN 11   < > 11   < > 9  --  9  --  11  --  20 14  CREATININE 0.91   < > 1.05*   < > 0.88  --  0.97  --  1.00  --  1.17* 0.88  CALCIUM 8.6*   < > 8.6*   < > 8.6*  --  8.5*  --  9.0  --  8.8* 8.6*  MG  --   --  1.5*  --  1.7  --  2.1  --  1.7  --   --  1.6*  ALKPHOS 98  --   --   --   --   --   --   --   --   --   --   --   AST 38  --   --   --   --   --   --   --   --   --   --   --   ALT 39  --   --   --   --   --   --   --   --   --   --   --   ALBUMIN 2.7*  --   --   --   --   --   --   --   --   --   --   --    < > = values in this interval not displayed.    CT HEAD WO CONTRAST  Result Date: 03/07/2020 CLINICAL DATA:  Bradycardia and syncope. EXAM: CT HEAD WITHOUT  CONTRAST TECHNIQUE: Contiguous axial images were obtained from the base of the skull through the vertex without intravenous contrast. COMPARISON:  October 13, 2018 FINDINGS: Brain: There is mild cerebral atrophy with widening of the extra-axial spaces and ventricular dilatation. There are areas of decreased attenuation within the white matter tracts of the supratentorial brain, consistent with microvascular disease changes. Small chronic lacunar infarcts are seen within the pons. Vascular: No hyperdense vessel or unexpected calcification. Skull: Normal. Negative for fracture or focal lesion. Sinuses/Orbits: No acute finding. Other: None. IMPRESSION: 1. Generalized cerebral atrophy. 2. No acute intracranial abnormality. Electronically Signed   By: Virgina Norfolk M.D.   On: 03/07/2020 18:05   DG Chest Port 1 View  Result Date: 03/07/2020 CLINICAL DATA:  Bradycardia EXAM: PORTABLE CHEST 1 VIEW COMPARISON:  04/15/2019 FINDINGS: Cardiomegaly. Implantable loop recorder. Both lungs are clear. The visualized skeletal structures are unremarkable. IMPRESSION: Cardiomegaly without acute abnormality of the lungs in AP portable projection. Electronically Signed   By: Eddie Candle M.D.   On: 03/07/2020 15:03   ECHOCARDIOGRAM COMPLETE  Result Date: 03/08/2020    ECHOCARDIOGRAM REPORT   Patient Name:   [name] Date of Exam: 03/08/2020 Medical Rec #:  [id number]                  Height:       67.0 in Accession #:    [id number]                 Weight:       229.3 lb Date of Birth:  July 24, 1951                   BSA:          2.143 m Patient Age:    51 years                   BP:  174/56 mmHg Patient Gender: F                          HR:           42 bpm. Exam Location:  Inpatient Procedure: 2D Echo, Cardiac Doppler and Color Doppler Indications:    R94.31 Abnormal EKG  History:        Patient has prior history of Echocardiogram examinations, most                 recent 10/16/2018. CHF, Stroke; Risk  Factors:Hypertension,                 Diabetes, Dyslipidemia and GERD. Hypothyroidism.  Sonographer:    Jonelle Sidle Dance Referring Phys: [place]  1. Left ventricular ejection fraction, by estimation, is 60 to 65%. Left ventricular ejection fraction by PLAX is 63 %. The left ventricle has normal function. The left ventricle has no regional wall motion abnormalities. Left ventricular diastolic parameters are indeterminate.  2. Right ventricular systolic function is normal. The right ventricular size is normal. There is moderately elevated pulmonary artery systolic pressure.  3. Left atrial size was mildly dilated.  4. The mitral valve is normal in structure. Mild mitral valve regurgitation. No evidence of mitral stenosis.  5. Tricuspid valve regurgitation is mild to moderate.  6. The aortic valve is tricuspid. Aortic valve regurgitation is not visualized. No aortic stenosis is present.  7. The inferior vena cava is dilated in size with >50% respiratory variability, suggesting right atrial pressure of 8 mmHg. FINDINGS  Left Ventricle: Left ventricular ejection fraction, by estimation, is 60 to 65%. Left ventricular ejection fraction by PLAX is 63 % The left ventricle has normal function. The left ventricle has no regional wall motion abnormalities. The left ventricular internal cavity size was normal in size. There is no left ventricular hypertrophy. Left ventricular diastolic parameters are indeterminate. Right Ventricle: The right ventricular size is normal. No increase in right ventricular wall thickness. Right ventricular systolic function is normal. There is moderately elevated pulmonary artery systolic pressure. The tricuspid regurgitant velocity is 3.30 m/s, and with an assumed right atrial pressure of 8 mmHg, the estimated right ventricular systolic pressure is 56.2 mmHg. Left Atrium: Left atrial size was mildly dilated. Right Atrium: Right atrial size was normal in size. Pericardium: There is  no evidence of pericardial effusion. Mitral Valve: The mitral valve is normal in structure. There is mild thickening of the mitral valve leaflet(s). There is mild calcification of the mitral valve leaflet(s). Normal mobility of the mitral valve leaflets. Mild mitral valve regurgitation. No evidence of mitral valve stenosis. Tricuspid Valve: The tricuspid valve is normal in structure. Tricuspid valve regurgitation is mild to moderate. No evidence of tricuspid stenosis. Aortic Valve: The aortic valve is tricuspid. . There is mild thickening and mild calcification of the aortic valve. Aortic valve regurgitation is not visualized. No aortic stenosis is present. There is mild thickening of the aortic valve. There is mild calcification of the aortic valve. Pulmonic Valve: The pulmonic valve was normal in structure. Pulmonic valve regurgitation is mild. No evidence of pulmonic stenosis. Aorta: The aortic root is normal in size and structure. Venous: The inferior vena cava is dilated in size with greater than 50% respiratory variability, suggesting right atrial pressure of 8 mmHg. IAS/Shunts: No atrial level shunt detected by color flow Doppler.  LEFT VENTRICLE PLAX 2D LV EF:  Left ventricular ejection fraction by PLAX is 63 % LVIDd:         4.99 cm LVIDs:         3.30 cm LV PW:         1.00 cm LV IVS:        1.03 cm LVOT diam:     2.10 cm LV SV:         64 LV SV Index:   30 LVOT Area:     3.46 cm  RIGHT VENTRICLE            IVC RV Basal diam:  2.72 cm    IVC diam: 2.31 cm RV S prime:     7.90 cm/s TAPSE (M-mode): 2.1 cm LEFT ATRIUM             Index       RIGHT ATRIUM           Index LA diam:        5.10 cm 2.38 cm/m  RA Area:     17.00 cm LA Vol (A2C):   71.4 ml 33.32 ml/m RA Volume:   45.90 ml  21.42 ml/m LA Vol (A4C):   62.7 ml 29.26 ml/m LA Biplane Vol: 71.2 ml 33.22 ml/m  AORTIC VALVE LVOT Vmax:   61.30 cm/s LVOT Vmean:  41.200 cm/s LVOT VTI:    0.186 m  AORTA Ao Root diam: 3.00 cm Ao Asc diam:  3.20 cm  MITRAL VALVE                TRICUSPID VALVE MV Area (PHT): 1.98 cm     TR Peak grad:   43.6 mmHg MV Decel Time: 383 msec     TR Vmax:        330.00 cm/s MV E velocity: 107.00 cm/s                             SHUNTS                             Systemic VTI:  0.19 m                             Systemic Diam: 2.10 cm Skeet Latch MD Electronically signed by Skeet Latch MD Signature Date/Time: 03/08/2020/12:05:33 PM    Final    Results/Tests Pending at Time of Discharge: None  Discharge Medications:  Allergies as of 03/14/2020      Reactions   Latex Rash      Medication List    STOP taking these medications   HYDROcodone-acetaminophen 5-325 MG tablet Commonly known as: NORCO/VICODIN   loperamide 2 MG capsule Commonly known as: IMODIUM   ondansetron 4 MG disintegrating tablet Commonly known as: ZOFRAN-ODT   oxyCODONE 5 MG immediate release tablet Commonly known as: Oxy IR/ROXICODONE     TAKE these medications   Accu-Chek Guide test strip Generic drug: glucose blood   Accu-Chek Guide w/Device Kit   acetaminophen 500 MG tablet Commonly known as: TYLENOL Take 1,000 mg by mouth as needed for mild pain.   apixaban 5 MG Tabs tablet Commonly known as: Eliquis Take 1 tablet (5 mg total) by mouth 2 (two) times daily.   atorvastatin 80 MG tablet Commonly known as: LIPITOR Take 1 tablet (80 mg total) by mouth daily. What changed: when to take  this   CRANBERRY EXTRACT PO Take 1 capsule by mouth daily.   fexofenadine 30 MG tablet Commonly known as: ALLEGRA Take 30 mg by mouth daily as needed (allergies).   FLUoxetine 10 MG capsule Commonly known as: PROzac Take 1 capsule (10 mg total) by mouth daily. What changed: additional instructions   FLUoxetine 20 MG tablet Commonly known as: PROZAC Take 1 tablet (20 mg total) by mouth daily. Take with 43m for 340mtotal. What changed: additional instructions   gabapentin 300 MG capsule Commonly known as: NEURONTIN TAKE  ONE CAPSULE BY MOUTH AT BEDTIME   insulin aspart 100 UNIT/ML injection Commonly known as: NovoLOG To be used in patient's insulin pump   insulin pump Soln Inject 1 each into the skin continuous. Novolog insulin   levothyroxine 175 MCG tablet Commonly known as: SYNTHROID Take 175 mcg by mouth daily before breakfast.   losartan 100 MG tablet Commonly known as: COZAAR TAKE 1 TABLET BY MOUTH DAILY What changed:   medication strength  when to take this   metoprolol succinate 25 MG 24 hr tablet Commonly known as: TOPROL-XL Take 1 tablet (25 mg total) by mouth daily. Start taking on: March 15, 2020 What changed:   medication strength  how much to take  additional instructions   pantoprazole 40 MG tablet Commonly known as: PROTONIX Take 1 tablet (40 mg total) by mouth 2 (two) times daily before a meal.   potassium chloride 10 MEQ tablet Commonly known as: KLOR-CON Take 1 tablet by mouth on days toresmide is taken What changed:   how much to take  how to take this  when to take this  reasons to take this  additional instructions   torsemide 10 MG tablet Commonly known as: DEMADEX TAKE 1 TABLET BY MOUTH DAILY AS NEEDED FOR FLUID What changed: See the new instructions.       Discharge Instructions: Please refer to Patient Instructions section of EMR for full details.  Patient was counseled important signs and symptoms that should prompt return to medical care, changes in medications, dietary instructions, activity restrictions, and follow up appointments.   Follow-Up Appointments:  Contact information for follow-up providers    [facility].   Specialty: Cardiology [facility] labs between 7:30 and 4.  Contact information: [address]     [facility].   Specialty: Cardiology Why: Cardiology  hospital follow up 04/02/20 at 10:20. Please arrive 15 minutes early for check in.  Contact information: [id number]. [address] [phone]        [facility] Follow up.   Specialty: Cardiology Why: Electrophysiology hospital follow up with Dr. CaCurt BearsPA on 04/12/20 at 10:15.  Contact information: [phone]        Shirley, JoMartiniqueDO. Schedule an appointment as soon as possible for a visit.   Specialty: Family Medicine Why: after discharge from SNF Contact information: [address] [phone]          Contact information for after-discharge care    [facility] .   Service: Skilled Nursing Contact information: [address] [phone]  [place], DO 03/14/2020, 3:07 PM PGY-2, [place]

## 2020-03-09 NOTE — Progress Notes (Signed)
Returned to patient's bedside for PPV.  She indicated that her glucose level was too low and she wanted to eat.  She was alert and oriented watching television.

## 2020-03-09 NOTE — Progress Notes (Addendum)
Progress Note  Patient Name: [name] Date of Encounter: 03/09/2020  Primary Cardiologist: Candee Furbish, MD   Subjective   Denies any further dizziness or syncope  Inpatient Medications    Scheduled Meds: . ALPRAZolam  0.25 mg Oral Once  . apixaban  5 mg Oral BID  . atorvastatin  80 mg Oral q1800  . FLUoxetine  30 mg Oral Daily  . gabapentin  300 mg Oral QHS  . insulin pump   Subcutaneous TID WC, HS, 0200  . levothyroxine  175 mcg Oral Q0600  . losartan  50 mg Oral Daily  . polyethylene glycol  17 g Oral Daily   Continuous Infusions: . magnesium sulfate bolus IVPB     PRN Meds: acetaminophen **OR** acetaminophen   Vital Signs    Vitals:   03/08/20 1532 03/08/20 2027 03/09/20 0454 03/09/20 0842  BP: (!) 174/53 (!) 192/76 (!) 161/64 (!) 179/65  Pulse:  (!) 57 61   Resp: (!) 24   18  Temp:  98.2 F (36.8 C) 98.8 F (37.1 C)   TempSrc:  Oral Oral   SpO2:  96% 97%   Weight:   108.3 kg   Height:        Intake/Output Summary (Last 24 hours) at 03/09/2020 1137 Last data filed at 03/09/2020 0800 Gross per 24 hour  Intake 600 ml  Output 400 ml  Net 200 ml   Filed Weights   03/07/20 1447 03/08/20 0346 03/09/20 0454  Weight: 105 kg 104 kg 108.3 kg    Telemetry    NSR/SB at 60bpm with frequent blocked PACs dropping HR in to the 40's - Personally Reviewed  ECG    No new EKG to review - Personally Reviewed  Physical Exam   GEN: No acute distress.   Neck: No JVD Cardiac: RRR, no murmurs, rubs, or gallops.  Respiratory: Clear to auscultation bilaterally. GI: Soft, nontender, non-distended  MS: No edema; No deformity. Neuro:  Nonfocal  Psych: Normal affect   Labs    Chemistry Recent Labs  Lab 03/07/20 1505 03/08/20 0338 03/09/20 0801  NA 141 144 140  K 3.5 3.8 3.9  CL 110 110 107  CO2 26 21* 24  GLUCOSE 189* 148* 215*  BUN 12 11 11   CREATININE 0.99 0.91 1.05*  CALCIUM 8.6* 8.6* 8.6*  PROT 6.1* 5.4*  --   ALBUMIN 3.1* 2.7*   --   AST 68* 38  --   ALT 54* 39  --   ALKPHOS 107 98  --   BILITOT 0.5 0.9  --   GFRNONAA 58* >60 54*  GFRAA >60 >60 >60  ANIONGAP 5 13 9      Hematology Recent Labs  Lab 03/07/20 1505  WBC 3.3*  RBC 3.77*  HGB 10.8*  HCT 34.7*  MCV 92.0  MCH 28.6  MCHC 31.1  RDW 12.8  PLT 168    Cardiac EnzymesNo results for input(s): TROPONINI in the last 168 hours. No results for input(s): TROPIPOC in the last 168 hours.   BNPNo results for input(s): BNP, PROBNP in the last 168 hours.   DDimer No results for input(s): DDIMER in the last 168 hours.   Radiology    CT HEAD WO CONTRAST  Result Date: 03/07/2020 CLINICAL DATA:  Bradycardia and syncope. EXAM: CT HEAD WITHOUT CONTRAST TECHNIQUE: Contiguous axial images were obtained from the base of the skull through the vertex without intravenous contrast. COMPARISON:  October 13, 2018 FINDINGS: Brain: There is mild  cerebral atrophy with widening of the extra-axial spaces and ventricular dilatation. There are areas of decreased attenuation within the white matter tracts of the supratentorial brain, consistent with microvascular disease changes. Small chronic lacunar infarcts are seen within the pons. Vascular: No hyperdense vessel or unexpected calcification. Skull: Normal. Negative for fracture or focal lesion. Sinuses/Orbits: No acute finding. Other: None. IMPRESSION: 1. Generalized cerebral atrophy. 2. No acute intracranial abnormality. Electronically Signed   By: Virgina Norfolk M.D.   On: 03/07/2020 18:05   DG Chest Port 1 View  Result Date: 03/07/2020 CLINICAL DATA:  Bradycardia EXAM: PORTABLE CHEST 1 VIEW COMPARISON:  04/15/2019 FINDINGS: Cardiomegaly. Implantable loop recorder. Both lungs are clear. The visualized skeletal structures are unremarkable. IMPRESSION: Cardiomegaly without acute abnormality of the lungs in AP portable projection. Electronically Signed   By: Eddie Candle M.D.   On: 03/07/2020 15:03   ECHOCARDIOGRAM  COMPLETE  Result Date: 03/08/2020    ECHOCARDIOGRAM REPORT   Patient Name:   [name] Date of Exam: 03/08/2020 Medical Rec #:  [id number]                  Height:       67.0 in Accession #:    [id number]                 Weight:       229.3 lb Date of Birth:  [birth year]                   BSA:          2.143 m Patient Age:    69 years                   BP:           174/56 mmHg Patient Gender: F                          HR:           42 bpm. Exam Location:  Inpatient Procedure: 2D Echo, Cardiac Doppler and Color Doppler Indications:    R94.31 Abnormal EKG  History:        Patient has prior history of Echocardiogram examinations, most                 recent 10/16/2018. CHF, Stroke; Risk Factors:Hypertension,                 Diabetes, Dyslipidemia and GERD. Hypothyroidism.  Sonographer:    Jonelle Sidle Dance Referring Phys: [place]  1. Left ventricular ejection fraction, by estimation, is 60 to 65%. Left ventricular ejection fraction by PLAX is 63 %. The left ventricle has normal function. The left ventricle has no regional wall motion abnormalities. Left ventricular diastolic parameters are indeterminate.  2. Right ventricular systolic function is normal. The right ventricular size is normal. There is moderately elevated pulmonary artery systolic pressure.  3. Left atrial size was mildly dilated.  4. The mitral valve is normal in structure. Mild mitral valve regurgitation. No evidence of mitral stenosis.  5. Tricuspid valve regurgitation is mild to moderate.  6. The aortic valve is tricuspid. Aortic valve regurgitation is not visualized. No aortic stenosis is present.  7. The inferior vena cava is dilated in size with >50% respiratory variability, suggesting right atrial pressure of 8 mmHg. FINDINGS  Left Ventricle: Left ventricular ejection fraction, by estimation, is 60 to 65%. Left ventricular ejection  fraction by PLAX is 63 % The left ventricle has normal function. The left ventricle  has no regional wall motion abnormalities. The left ventricular internal cavity size was normal in size. There is no left ventricular hypertrophy. Left ventricular diastolic parameters are indeterminate. Right Ventricle: The right ventricular size is normal. No increase in right ventricular wall thickness. Right ventricular systolic function is normal. There is moderately elevated pulmonary artery systolic pressure. The tricuspid regurgitant velocity is 3.30 m/s, and with an assumed right atrial pressure of 8 mmHg, the estimated right ventricular systolic pressure is 123XX123 mmHg. Left Atrium: Left atrial size was mildly dilated. Right Atrium: Right atrial size was normal in size. Pericardium: There is no evidence of pericardial effusion. Mitral Valve: The mitral valve is normal in structure. There is mild thickening of the mitral valve leaflet(s). There is mild calcification of the mitral valve leaflet(s). Normal mobility of the mitral valve leaflets. Mild mitral valve regurgitation. No evidence of mitral valve stenosis. Tricuspid Valve: The tricuspid valve is normal in structure. Tricuspid valve regurgitation is mild to moderate. No evidence of tricuspid stenosis. Aortic Valve: The aortic valve is tricuspid. . There is mild thickening and mild calcification of the aortic valve. Aortic valve regurgitation is not visualized. No aortic stenosis is present. There is mild thickening of the aortic valve. There is mild calcification of the aortic valve. Pulmonic Valve: The pulmonic valve was normal in structure. Pulmonic valve regurgitation is mild. No evidence of pulmonic stenosis. Aorta: The aortic root is normal in size and structure. Venous: The inferior vena cava is dilated in size with greater than 50% respiratory variability, suggesting right atrial pressure of 8 mmHg. IAS/Shunts: No atrial level shunt detected by color flow Doppler.  LEFT VENTRICLE PLAX 2D LV EF:         Left ventricular ejection fraction by PLAX is  63 % LVIDd:         4.99 cm LVIDs:         3.30 cm LV PW:         1.00 cm LV IVS:        1.03 cm LVOT diam:     2.10 cm LV SV:         64 LV SV Index:   30 LVOT Area:     3.46 cm  RIGHT VENTRICLE            IVC RV Basal diam:  2.72 cm    IVC diam: 2.31 cm RV S prime:     7.90 cm/s TAPSE (M-mode): 2.1 cm LEFT ATRIUM             Index       RIGHT ATRIUM           Index LA diam:        5.10 cm 2.38 cm/m  RA Area:     17.00 cm LA Vol (A2C):   71.4 ml 33.32 ml/m RA Volume:   45.90 ml  21.42 ml/m LA Vol (A4C):   62.7 ml 29.26 ml/m LA Biplane Vol: 71.2 ml 33.22 ml/m  AORTIC VALVE LVOT Vmax:   61.30 cm/s LVOT Vmean:  41.200 cm/s LVOT VTI:    0.186 m  AORTA Ao Root diam: 3.00 cm Ao Asc diam:  3.20 cm MITRAL VALVE                TRICUSPID VALVE MV Area (PHT): 1.98 cm     TR Peak grad:   43.6 mmHg MV  Decel Time: 383 msec     TR Vmax:        330.00 cm/s MV E velocity: 107.00 cm/s                             SHUNTS                             Systemic VTI:  0.19 m                             Systemic Diam: 2.10 cm Skeet Latch MD Electronically signed by Skeet Latch MD Signature Date/Time: 03/08/2020/12:05:33 PM    Final     Cardiac Studies   2D echo 03/08/2020 IMPRESSIONS   1. Left ventricular ejection fraction, by estimation, is 60 to 65%. Left  ventricular ejection fraction by PLAX is 63 %. The left ventricle has  normal function. The left ventricle has no regional wall motion  abnormalities. Left ventricular diastolic  parameters are indeterminate.  2. Right ventricular systolic function is normal. The right ventricular  size is normal. There is moderately elevated pulmonary artery systolic  pressure.  3. Left atrial size was mildly dilated.  4. The mitral valve is normal in structure. Mild mitral valve  regurgitation. No evidence of mitral stenosis.  5. Tricuspid valve regurgitation is mild to moderate.  6. The aortic valve is tricuspid. Aortic valve regurgitation is not   visualized. No aortic stenosis is present.  7. The inferior vena cava is dilated in size with >50% respiratory  variability, suggesting right atrial pressure of 8 mmHg.   Patient Profile     69 y.o. female with a hx of hypertension, diabetes type 1, hyperlipidemia, thyroid cancer status post thyroidectomy, OSA on BiPAP therapy, carotid artery disease, CVA in 216 and 2019, GERD, paroxysmal atrial fibrillation on Eliquis who is being seen  for the evaluation of syncope at the request of Dr.McDiarmid.  Assessment & Plan    Syncope -Patient had mechanical fall followed by ?syncopal episode after sitting down on her walker with prodrome dizziness, lightheadedness, and feeling hot. No chest pain or palpitations. LOC 1 minute per her sister but she said that she was aware of her surroundings the entire time and just could not talk. Did not hit her head.  - possible vasovagal episode after tripping given prodrome - In the ED patient was hypertensive systolics in the [id number].  - CT head negative for acute process - EKG with sinus bradycardia, 43 bpm and frequent blocked PACs - Echo showed EF 60-65%, no WMA, mild MR, mild to mod TR, normal RV function, RA pressure 51mmHg - COVID negative - TSH 2.9 - ILR interrogation this am shows frequent PACs but unfortunately not turned on to detect bradyarrhythmias - reprogrammed to detect brady as well now -will discuss with Dr. Curt Bears - further work-up per IM  Bradycardia - HR 40s on initial exam. EKG shows NSR with frequent blocked PACs resulting in bradycardia when blocked.  HR otherwise in the 60's - BB held on admission - Might need EP consult for AAD therapy for suppression of PACs - replete Mag (low at 1.5) - K+ fine at 3.9  Paroxysmal Afib - Continue on Eliquis 5mg  BID for a/c - Creatinine 1.05 and Hbg 10.8 - CHADSVASC = 5 (female, age, CVA, DM) - has ILR since 2019. recent  study with 0.5% afib burden with no other arrythmias - Was on Toprol  for rate control>>held for bradycardia  HTN - BP markedly elevated at 192/27mmHg - likely increased in setting of holding BB - increase Losartan 100mg  daily  - Patient says she takes her medications daily and normal presures are 120-130s.  - may need to add amlodipine   Chronic diasolic HF - EF preserved - on torsemide 10mg  at home PRN - Trace edema on exam  DM1 - A1C 8.4 - continue Insulin pump  Hypothyroidism - continue synthroid - TSH normal at 2.978  H/O of stroke, cryptogenic - CT head negative for acute process  HLD - continue atorvastatin 80mg   PH  - 2/2 to obesity  Hypomagnesemia -Mag low at 1.5 -replete -repeat Mag level in am  I have spent a total of 35 minutes with patient reviewing hospital notes , telemetry, EKGs, personally reviewing ILR interrogation with pacer rep labs and examining patient as well as establishing an assessment and plan that was discussed with the patient.  > 50% of time was spent in direct patient care.       For questions or updates, please contact [place] Please consult [email or web address] for contact info under Cardiology/STEMI.      Signed, Fransico Him, MD  03/09/2020, 11:37 AM

## 2020-03-09 NOTE — Progress Notes (Signed)
PPV in use at this time

## 2020-03-09 NOTE — Progress Notes (Signed)
Family Medicine Teaching Service Daily Progress Note Intern Pager: [phone]  Patient name: [name] Medical record number: [id number] Date of birth: 01/05/1951 Age: 69 y.o. Gender: female  Primary Care Provider: Shirley, [name], DO Consultants: Cardiology Code Status: FULL   Pt Overview and Major Events to Date:  3/14: Admitted  Assessment and Plan:  Symptomatic bradycardia-unclear etiology Doing well this morning, denies dizziness.  Heart rate on telemetry: 40-50s. -Continuous telemetry and pulse oximetry  -Vitals per floor routine -F/u Cardiology recommendations, pending electrophysiology evaluation, continue Losartan 50 mg -Orthostatic Bps -Up with assistance -SLP eval -PT/OT  H/o HFpEF Echo: Left ejection fraction 60 to 65% -Home meds include torsemide 10 mg as needed for swelling -Hold home torsemide  H/o CVA with residual left sided weakness  CT head: no acute abnormalities  -Neurological status  Hypothyroidism Last TSH in 2019-2.47 Home meds: Levothyroxine 175 mcg once daily except Sundays -TSH today, consider T3-T4 if necessary -Continue Levothyroxine  T1DM CBGs 185, 186. Last A1c 8.4 on admission.  Sees Dr. Debbora Presto for endocrinology. Patient endorses peripheral neuropathy. Could also have autonomic reflex issues contributing to fall. Home meds: Insulin aspart via patient's insulin pump -Continue insulin pump - monitor CBGs  Normocytic anemia  leukopenia Hgb 10.8, WBC 3.3 on admission -Montitor with daily CBC  Afib HR 50s Home meds include: Eliquis 5 mg BID, metoprolol 100 mg XL once daily -Hold metoprolol -Continue Eliquis    HTN SBP 160-192 and DBP 55-64 denies neurological symptoms Home meds: Metoprolol 100 mg once daily -Hold metoprolol due to bradycardia -Cardiology following, appreciate recommendations started losartan 50 mg on 3/15 -Monitor Bps  GERD Home meds include Protonix 40 mg -Continue  Protonix  Depression- home meds: fluoxetine 30mg  daily -Continue fluoxetine  Hypercholesterolemia Home meds include atorvastatin 80 mg -Continue statin  Elevated transaminases AST 68>38, ALT 54>39  -CMP a.m.  Chronic Back Pain Home meds include: Gabapentin 300mg  qHS -Continue gabapentin 300 mg  FEN/GI: Heart healthy, Protonix Prophylaxis: Lovenox   Disposition: IP for next 1-2 days   Subjective:  Feels well this morning, denies dizziness.  Feels about the same compared to admission.  I explained that we would be waiting for the cardiology electrophysiologist to inform us of the plan of her low heart rate.  Objective: Temp:  [98.2 F (36.8 C)-98.8 F (37.1 C)] 98.8 F (37.1 C) (03/16 0454) Pulse Rate:  [43-61] 61 (03/16 0454) Resp:  [24] 24 (03/15 1532) BP: (161-192)/(53-76) 161/64 (03/16 0454) SpO2:  [96 %-97 %] 97 % (03/16 0454) Weight:  [108.3 kg] 108.3 kg (03/16 0454)   Physical Exam:  General: Alert and cooperative and appears to be in no acute distress Cardio: Normal S1 and S2, RRR. No murmurs or rubs.   Pulm: CTAB, no crackles, normal WOB Abdomen: Bowel sounds normal. Abdomen soft and non-tender.  Extremities: No peripheral edema. Warm/ well perfused.  Strong radial pulse. Neuro: Cranial nerves grossly intact  Laboratory: Recent Labs  Lab 03/07/20 1505  WBC 3.3*  HGB 10.8*  HCT 34.7*  PLT 168   Recent Labs  Lab 03/07/20 1505 03/08/20 0338  NA 141 144  K 3.5 3.8  CL 110 110  CO2 26 21*  BUN 12 11  CREATININE 0.99 0.91  CALCIUM 8.6* 8.6*  PROT 6.1* 5.4*  BILITOT 0.5 0.9  ALKPHOS 107 98  ALT 54* 39  AST 68* 38  GLUCOSE 189* 148*     Imaging/Diagnostic Tests:   Lattie Haw, MD 03/09/2020, 7:55 AM PGY-1, [facility] Intern pager: [phone], text pages welcome

## 2020-03-10 ENCOUNTER — Telehealth (HOSPITAL_COMMUNITY): Payer: Medicare PPO | Admitting: Physician Assistant

## 2020-03-10 ENCOUNTER — Inpatient Hospital Stay (HOSPITAL_COMMUNITY): Payer: Medicare PPO

## 2020-03-10 DIAGNOSIS — I2510 Atherosclerotic heart disease of native coronary artery without angina pectoris: Secondary | ICD-10-CM

## 2020-03-10 DIAGNOSIS — N069 Isolated proteinuria with unspecified morphologic lesion: Secondary | ICD-10-CM

## 2020-03-10 DIAGNOSIS — I4430 Unspecified atrioventricular block: Secondary | ICD-10-CM

## 2020-03-10 DIAGNOSIS — R0789 Other chest pain: Secondary | ICD-10-CM

## 2020-03-10 DIAGNOSIS — E782 Mixed hyperlipidemia: Secondary | ICD-10-CM

## 2020-03-10 DIAGNOSIS — W19XXXS Unspecified fall, sequela: Secondary | ICD-10-CM

## 2020-03-10 DIAGNOSIS — E1059 Type 1 diabetes mellitus with other circulatory complications: Secondary | ICD-10-CM

## 2020-03-10 DIAGNOSIS — E1069 Type 1 diabetes mellitus with other specified complication: Secondary | ICD-10-CM

## 2020-03-10 LAB — GLUCOSE, CAPILLARY
Glucose-Capillary: 178 mg/dL — ABNORMAL HIGH (ref 70–99)
Glucose-Capillary: 256 mg/dL — ABNORMAL HIGH (ref 70–99)
Glucose-Capillary: 226 mg/dL — ABNORMAL HIGH (ref 70–99)
Glucose-Capillary: 218 mg/dL — ABNORMAL HIGH (ref 70–99)

## 2020-03-10 LAB — BASIC METABOLIC PANEL
Anion gap: 12 (ref 5–15)
BUN: 9 mg/dL (ref 8–23)
CO2: 25 mmol/L (ref 22–32)
Calcium: 8.6 mg/dL — ABNORMAL LOW (ref 8.9–10.3)
Chloride: 102 mmol/L (ref 98–111)
Creatinine, Ser: 0.88 mg/dL (ref 0.44–1.00)
GFR calc Af Amer: >60 mL/min (ref >60)
GFR calc non Af Amer: >60 mL/min (ref >60)
Glucose, Bld: 205 mg/dL — ABNORMAL HIGH (ref 70–99)
Potassium: 3.7 mmol/L (ref 3.5–5.1)
Sodium: 139 mmol/L (ref 135–145)

## 2020-03-10 LAB — MAGNESIUM: Magnesium: 1.7 mg/dL (ref 1.7–2.4)

## 2020-03-10 MED ORDER — NITROGLYCERIN 0.4 MG SL SUBL
SUBLINGUAL_TABLET | SUBLINGUAL | Status: AC
  Administered 2020-03-10 (×2): 0.8 mg
  Filled 2020-03-10: qty 2

## 2020-03-10 MED ORDER — POTASSIUM CHLORIDE CRYS ER 20 MEQ PO TBCR
20.0000 meq | EXTENDED_RELEASE_TABLET | Freq: Once | ORAL | Status: AC
  Administered 2020-03-10: 20 meq via ORAL
  Filled 2020-03-10: qty 1

## 2020-03-10 MED ORDER — MAGNESIUM SULFATE 2 GM/50ML IV SOLN
2.0000 g | Freq: Once | INTRAVENOUS | Status: AC
  Administered 2020-03-10: 10:00:00 2 g via INTRAVENOUS
  Filled 2020-03-10: qty 50

## 2020-03-10 MED ORDER — TORSEMIDE 20 MG PO TABS: 10.0000 mg | ORAL_TABLET | Freq: Every day | ORAL | Status: DC

## 2020-03-10 MED ORDER — IOHEXOL 350 MG/ML SOLN
80.0000 mL | Freq: Once | INTRAVENOUS | Status: AC | PRN
  Administered 2020-03-10: 12:00:00 80 mL via INTRAVENOUS

## 2020-03-10 MED ORDER — NITROGLYCERIN 0.4 MG SL SUBL
SUBLINGUAL_TABLET | SUBLINGUAL | Status: AC
  Filled 2020-03-10: qty 2

## 2020-03-10 MED ORDER — AMLODIPINE BESYLATE 5 MG PO TABS
5.0000 mg | ORAL_TABLET | Freq: Every day | ORAL | Status: DC
  Administered 2020-03-10 – 2020-03-14 (×5): 5 mg via ORAL
  Filled 2020-03-10 (×5): qty 1

## 2020-03-10 NOTE — Progress Notes (Signed)
03/10/20 1806  PT Visit Information  Last PT Received On 03/10/20  Assistance Needed +2  PT/OT/SLP Co-Evaluation/Treatment Yes  Reason for Co-Treatment To address functional/ADL transfers;For patient/therapist safety  PT goals addressed during session Mobility/safety with mobility;Balance;Proper use of DME  History of Present Illness Pt is a 69 y.o. female presenting with fall and near syncope episode. PMHx is significant for type 1 diabetes, A. fib, history of CVAs, hypothyroidism, hypertension, hyperlipidemia, history of HFpEF, GERD, depression/anxiety, chronic back pain.  Precautions  Precautions Fall  Precaution Comments Has had multiple falls; one where she broke her arm  Restrictions  Weight Bearing Restrictions No  Home Living  Family/patient expects to be discharged to: Private residence  Living Arrangements Other relatives (sister)  Available Help at Discharge Family;Available PRN/intermittently  Type of [place] entrance  Home Layout One level  Bathroom Shower/Tub Walk-in shower  [place] - 4 wheels;Electric scooter;Shower seat;BSC  Prior Function  Level of Independence Needs assistance  Gait / Transfers Assistance Needed Uses scooter for some mobility and uses rollator some inside of house especially when going to bathroom. Needs help with transfers sometimes.   ADL's / Homemaking Assistance Needed Pt sponge bathes, granddaughter helps with getting shoes on and washing back.   Communication  Communication No difficulties  Pain Assessment  Pain Assessment No/denies pain  Cognition  Arousal/Alertness Awake/alert  Behavior During Therapy WFL for tasks assessed/performed  Overall Cognitive Status No family/caregiver present to determine baseline cognitive functioning  General Comments pt  with difficulty clarifying PLOF, decreased insight into safety given current deficits  Upper Extremity Assessment   Upper Extremity Assessment Defer to OT evaluation  Lower Extremity Assessment  Lower Extremity Assessment LLE deficits/detail  LLE Deficits / Details Pt with residual weakness in LLE and reports numbness as well from previous CVA.   LLE Sensation decreased light touch;decreased proprioception  Bed Mobility  Overal bed mobility Needs Assistance  Bed Mobility Supine to Sit  Supine to sit Min assist  General bed mobility comments for trunk support, increased time/effort to scoot towards EOB  Transfers  Overall transfer level Needs assistance  Equipment used Rolling walker (2 wheeled)  Transfers Sit to/from [employer] Transfers  Sit to Stand Mod assist;+2 physical assistance  Stand pivot transfers Min assist;+2 physical assistance  General transfer comment pt required heavy boosting assist from EOB, increased time for transition of hands to RW, pt able to tolerate standing for BP reading prior to taking few steps to recliner, she requires steadying assist throughout due to unsteadiness   Balance  Overall balance assessment Needs assistance  Sitting-balance support Feet supported  Sitting balance-Leahy Scale Fair  Standing balance support Bilateral upper extremity supported  Standing balance-Leahy Scale Poor  Standing balance comment reliant on UE support/external assist  General Comments  General comments (skin integrity, edema, etc.) BP stable throughout, pt denies dizziness   PT - End of Session  Equipment Utilized During Treatment Gait belt  Activity Tolerance Patient tolerated treatment well  Patient left in bed;with call bell/phone within reach;with nursing/sitter in room  Nurse Communication Mobility status  PT Assessment  PT Recommendation/Assessment Patient needs continued PT services  PT Visit Diagnosis Unsteadiness on feet (R26.81);Muscle weakness (generalized) (M62.81)  PT Problem List Decreased strength;Decreased balance;Decreased mobility;Decreased knowledge of use  of DME;Decreased cognition;Decreased safety awareness;Decreased knowledge of precautions  Barriers to Discharge Decreased caregiver support  PT Plan  PT Frequency (ACUTE ONLY) Min 3X/week  PT Treatment/Interventions (ACUTE ONLY) DME instruction;Gait training;Functional mobility training;Therapeutic activities;Therapeutic exercise;Balance training;Patient/family education  AM-PAC PT "6 Clicks" Mobility Outcome Measure (Version 2)  Help needed turning from your back to your side while in a flat bed without using bedrails? 3  Help needed moving from lying on your back to sitting on the side of a flat bed without using bedrails? 3  Help needed moving to and from a bed to a chair (including a wheelchair)? 3  Help needed standing up from a chair using your arms (e.g., wheelchair or bedside chair)? 2  Help needed to walk in hospital room? 2  Help needed climbing 3-5 steps with a railing?  2  6 Click Score 15  Consider Recommendation of Discharge To: CIR/SNF/LTACH  PT Recommendation  Follow Up Recommendations SNF;Supervision/Assistance - 24 hour (will likely refuse)  PT equipment Wheelchair cushion (measurements PT);Wheelchair (measurements PT)  Individuals Consulted  Consulted and Agree with Results and Recommendations Patient  Acute Rehab PT Goals  Patient Stated Goal wants to return home  PT Goal Formulation With patient  Time For Goal Achievement 03/24/20  Potential to Achieve Goals Fair  PT Time Calculation  PT Start Time (ACUTE ONLY) 1550  PT Stop Time (ACUTE ONLY) 1612  PT Time Calculation (min) (ACUTE ONLY) 22 min  PT General Charges  $$ ACUTE PT VISIT 1 Visit  PT Evaluation  $PT Eval Moderate Complexity 1 Mod  Written Expression  Dominant Hand Right   Pt presenting with problem above and deficits above. Pt requiring min-mod A +2 to perform transfers this session. Pt also presenting with some cognitive deficits, however, unsure of baseline. Pt reports she lives with her sister,  but her sister cannot provide 24/7 assist. Feel pt will benefit from SNF level therapies to increase independence and safety with transfers, however, pt will likely refuse. If she refuses, will need max HH services. Will continue to follow acutely.   Reuel Derby, PT, DPT  Acute Rehabilitation Services  Pager: [phone] Office: [phone]

## 2020-03-10 NOTE — Progress Notes (Signed)
Progress Note  Patient Name: [name] Date of Encounter: 03/10/2020  Primary Cardiologist: Candee Furbish, MD   Subjective   Pt denies any further syncope/near syncope. She has not been up moving around yet. Staff encouraged to get pt up with close monitoring.  Pt does feel like she has extra fluid on board with mild ankle edema and brief intermittent chest tightness. No orthopnea. She usually takes her PRN torsemide about once a week.   Inpatient Medications    Scheduled Meds: . ALPRAZolam  0.25 mg Oral Once  . apixaban  5 mg Oral BID  . atorvastatin  80 mg Oral q1800  . FLUoxetine  30 mg Oral Daily  . gabapentin  300 mg Oral QHS  . insulin pump   Subcutaneous TID WC, HS, 0200  . levothyroxine  175 mcg Oral Q0600  . losartan  100 mg Oral Daily  . pantoprazole  40 mg Oral BID AC  . polyethylene glycol  17 g Oral Daily   Continuous Infusions:  PRN Meds: acetaminophen **OR** acetaminophen, loratadine   Vital Signs    Vitals:   03/09/20 1330 03/09/20 1343 03/09/20 2030 03/10/20 0500  BP: (!) 149/74  (!) 178/60 (!) 176/63  Pulse:  (!) 55 (!) 52 (!) 55  Resp:   18 18  Temp:  98.1 F (36.7 C) 98.1 F (36.7 C) 98.3 F (36.8 C)  TempSrc:  Oral Oral Oral  SpO2:   96% 93%  Weight:    91.9 kg  Height:        Intake/Output Summary (Last 24 hours) at 03/10/2020 0904 Last data filed at 03/10/2020 0800 Gross per 24 hour  Intake 970 ml  Output 400 ml  Net 570 ml   Last 3 Weights 03/10/2020 03/09/2020 03/08/2020  Weight (lbs) 202 lb 11.2 oz 238 lb 12.1 oz 229 lb 4.5 oz  Weight (kg) 91.944 kg 108.3 kg 104 kg      Telemetry    SR/SB 50's-60s with frequent non-conducted PACs - Personally Reviewed  ECG    No new tracings for review  Physical Exam   GEN: No acute distress.   Neck: No JVD Cardiac: RRR, no murmurs, rubs, or gallops.  Respiratory: Clear to auscultation bilaterally. GI: Soft, nontender, non-distended  MS: trace edema; No  deformity. Neuro:  Nonfocal  Psych: Normal affect   Labs    High Sensitivity Troponin:   Recent Labs  Lab 03/07/20 1505 03/07/20 1934 03/08/20 0712 03/08/20 0842  TROPONINIHS 30* 69* 33* 34*      Chemistry Recent Labs  Lab 03/07/20 1505 03/07/20 1505 03/08/20 0338 03/09/20 0801 03/10/20 0431  NA 141   < > 144 140 139  K 3.5   < > 3.8 3.9 3.7  CL 110   < > 110 107 102  CO2 26   < > 21* 24 25  GLUCOSE 189*   < > 148* 215* 205*  BUN 12   < > 11 11 9   CREATININE 0.99   < > 0.91 1.05* 0.88  CALCIUM 8.6*   < > 8.6* 8.6* 8.6*  PROT 6.1*  --  5.4*  --   --   ALBUMIN 3.1*  --  2.7*  --   --   AST 68*  --  38  --   --   ALT 54*  --  39  --   --   ALKPHOS 107  --  98  --   --   BILITOT  0.5  --  0.9  --   --   GFRNONAA 58*   < > >60 54* >60  GFRAA >60   < > >60 >60 >60  ANIONGAP 5   < > 13 9 12    < > = values in this interval not displayed.     Hematology Recent Labs  Lab 03/07/20 1505  WBC 3.3*  RBC 3.77*  HGB 10.8*  HCT 34.7*  MCV 92.0  MCH 28.6  MCHC 31.1  RDW 12.8  PLT 168    BNPNo results for input(s): BNP, PROBNP in the last 168 hours.   DDimer No results for input(s): DDIMER in the last 168 hours.   Radiology    ECHOCARDIOGRAM COMPLETE  Result Date: 03/08/2020    ECHOCARDIOGRAM REPORT   Patient Name:   [name] Date of Exam: 03/08/2020 Medical Rec #:  [id number]                  Height:       67.0 in Accession #:    [id number]                 Weight:       229.3 lb Date of Birth:  February 26, 1951                   BSA:          2.143 m Patient Age:    69 years                   BP:           174/56 mmHg Patient Gender: F                          HR:           42 bpm. Exam Location:  Inpatient Procedure: 2D Echo, Cardiac Doppler and Color Doppler Indications:    R94.31 Abnormal EKG  History:        Patient has prior history of Echocardiogram examinations, most                 recent 10/16/2018. CHF, Stroke; Risk Factors:Hypertension,                  Diabetes, Dyslipidemia and GERD. Hypothyroidism.  Sonographer:    Jonelle Sidle Dance Referring Phys: [place]  1. Left ventricular ejection fraction, by estimation, is 60 to 65%. Left ventricular ejection fraction by PLAX is 63 %. The left ventricle has normal function. The left ventricle has no regional wall motion abnormalities. Left ventricular diastolic parameters are indeterminate.  2. Right ventricular systolic function is normal. The right ventricular size is normal. There is moderately elevated pulmonary artery systolic pressure.  3. Left atrial size was mildly dilated.  4. The mitral valve is normal in structure. Mild mitral valve regurgitation. No evidence of mitral stenosis.  5. Tricuspid valve regurgitation is mild to moderate.  6. The aortic valve is tricuspid. Aortic valve regurgitation is not visualized. No aortic stenosis is present.  7. The inferior vena cava is dilated in size with >50% respiratory variability, suggesting right atrial pressure of 8 mmHg. FINDINGS  Left Ventricle: Left ventricular ejection fraction, by estimation, is 60 to 65%. Left ventricular ejection fraction by PLAX is 63 % The left ventricle has normal function. The left ventricle has no regional wall motion abnormalities. The left ventricular internal cavity size was  normal in size. There is no left ventricular hypertrophy. Left ventricular diastolic parameters are indeterminate. Right Ventricle: The right ventricular size is normal. No increase in right ventricular wall thickness. Right ventricular systolic function is normal. There is moderately elevated pulmonary artery systolic pressure. The tricuspid regurgitant velocity is 3.30 m/s, and with an assumed right atrial pressure of 8 mmHg, the estimated right ventricular systolic pressure is 123XX123 mmHg. Left Atrium: Left atrial size was mildly dilated. Right Atrium: Right atrial size was normal in size. Pericardium: There is no evidence of pericardial effusion.  Mitral Valve: The mitral valve is normal in structure. There is mild thickening of the mitral valve leaflet(s). There is mild calcification of the mitral valve leaflet(s). Normal mobility of the mitral valve leaflets. Mild mitral valve regurgitation. No evidence of mitral valve stenosis. Tricuspid Valve: The tricuspid valve is normal in structure. Tricuspid valve regurgitation is mild to moderate. No evidence of tricuspid stenosis. Aortic Valve: The aortic valve is tricuspid. . There is mild thickening and mild calcification of the aortic valve. Aortic valve regurgitation is not visualized. No aortic stenosis is present. There is mild thickening of the aortic valve. There is mild calcification of the aortic valve. Pulmonic Valve: The pulmonic valve was normal in structure. Pulmonic valve regurgitation is mild. No evidence of pulmonic stenosis. Aorta: The aortic root is normal in size and structure. Venous: The inferior vena cava is dilated in size with greater than 50% respiratory variability, suggesting right atrial pressure of 8 mmHg. IAS/Shunts: No atrial level shunt detected by color flow Doppler.  LEFT VENTRICLE PLAX 2D LV EF:         Left ventricular ejection fraction by PLAX is 63 % LVIDd:         4.99 cm LVIDs:         3.30 cm LV PW:         1.00 cm LV IVS:        1.03 cm LVOT diam:     2.10 cm LV SV:         64 LV SV Index:   30 LVOT Area:     3.46 cm  RIGHT VENTRICLE            IVC RV Basal diam:  2.72 cm    IVC diam: 2.31 cm RV S prime:     7.90 cm/s TAPSE (M-mode): 2.1 cm LEFT ATRIUM             Index       RIGHT ATRIUM           Index LA diam:        5.10 cm 2.38 cm/m  RA Area:     17.00 cm LA Vol (A2C):   71.4 ml 33.32 ml/m RA Volume:   45.90 ml  21.42 ml/m LA Vol (A4C):   62.7 ml 29.26 ml/m LA Biplane Vol: 71.2 ml 33.22 ml/m  AORTIC VALVE LVOT Vmax:   61.30 cm/s LVOT Vmean:  41.200 cm/s LVOT VTI:    0.186 m  AORTA Ao Root diam: 3.00 cm Ao Asc diam:  3.20 cm MITRAL VALVE                TRICUSPID  VALVE MV Area (PHT): 1.98 cm     TR Peak grad:   43.6 mmHg MV Decel Time: 383 msec     TR Vmax:        330.00 cm/s MV E velocity: 107.00 cm/s  SHUNTS                             Systemic VTI:  0.19 m                             Systemic Diam: 2.10 cm Skeet Latch MD Electronically signed by Skeet Latch MD Signature Date/Time: 03/08/2020/12:05:33 PM    Final     Cardiac Studies   Echocardiogram 03/08/2020 IMPRESSIONS  1. Left ventricular ejection fraction, by estimation, is 60 to 65%. Left  ventricular ejection fraction by PLAX is 63 %. The left ventricle has  normal function. The left ventricle has no regional wall motion  abnormalities. Left ventricular diastolic  parameters are indeterminate.  2. Right ventricular systolic function is normal. The right ventricular  size is normal. There is moderately elevated pulmonary artery systolic  pressure.  3. Left atrial size was mildly dilated.  4. The mitral valve is normal in structure. Mild mitral valve  regurgitation. No evidence of mitral stenosis.  5. Tricuspid valve regurgitation is mild to moderate.  6. The aortic valve is tricuspid. Aortic valve regurgitation is not  visualized. No aortic stenosis is present.  7. The inferior vena cava is dilated in size with >50% respiratory  variability, suggesting right atrial pressure of 8 mmHg.   Patient Profile     69 y.o. female with a hx of hypertension, diabetes type 1, hyperlipidemia, thyroid cancer status post thyroidectomy, OSA on BiPAP therapy, carotid artery disease, CVAin 216 and 2019, GERD, paroxysmal atrial fibrillationon Eliquiswho is being seen  for the evaluation of syncope.  Assessment & Plan    Syncope -Patient hadmechanical fall followed by ?syncopal episode after sitting down on her walker with prodrome dizziness, lightheadedness, and feeling hot. No chest pain or palpitations. LOC 1 minute per her sister but she said that she was  aware of her surroundings the entire time and just could not talk. Did not hit her head.  -Question of possible vagal episode after tripping.  In ED she was hypertensive with SBP in the 170's.  -head CT negative for acute process. -EKG with Sinus bradycardia, 43 bpm, and frequent non-conducted PACs. Beta blocker was stopped. Telemetry now shows SR/SB 50's-60's with frequent non-conducted PACs.  - Echo showed EF 60-65%, no WMA, mild MR, mild to mod TR, normal RV function, RA pressure 66mmHg - COVID negative - TSH 2.9 - ILR interrogation shows frequent PACs but unfortunately not turned on to detect bradyarrhythmias - reprogrammed to detect brady now. -EP will see pt today for further evaluation of the frequent blocked PACS resulting in significant bradycardia   Bradycardia -EKG with Sinus bradycardia, 43 bpm, and frequent non-conducted PACs. Beta blocker was stopped. Telemetry now shows SR/SB 50's-60's with frequent non-conducted PACs.  -BB is discontinued. -EP will see pt today.   Paroxysmal atrial fibrillation  -Now maintaining SR/SB off BB.  -Continues on Eliquis for anticoagulation for stroke risk reduction with CHADSVASC of 5 (female, age, CVA, DM) -Has ILR since 2019. recent study with 0.5% afib burden with no other arrythmias  Hypertension -BP continues to be elevated in the 170's. Pt reported that home BPs were well controlled.  Now off BB.  -Losartan was increased to 100 mg yesterday.  -unfortunately her BP remains elevated -will add amlodipine 5mg  daily  Chronic diastolic heart failure -EF preserved.  -On Torsemide 10 mg at home PRN -  complains of LE edema this am and on exam is trace - she says that her legs start to hurt when she develops edema -will give a dose of her PRN Demadex today  DM type 1 -A1C 8.4 -Continues on Insulin pump. Management per IM  Hypothyroidism  -Continues on synthroid -TSH 2.978  Hx of cryptogenic stroke -Head CT negative for acute process in  work up for syncope  HLD - continue atorvastatin 80mg   PH  - Related to obesity  Hypomagnesemia -Mag was low at 1.5, given Mag sulfate 2 gms, follow up mag level  1.7 -will give another dose of 2gm IV mag today -repeat Mag level in am  Chest pain -this is concerning -she has been having this intermittently for a few weeks and describes it as a tightness -there is no radiation of the discomfort and it is nonexertional -given episode of ? Syncope and chest discomfort I think ischemic workup is indicated -will get a coronary CTA to assess for underlying CAD today as patient is fully anticoagulated with Eliquis at this time      For questions or updates, please contact [place] Please consult [email or web address] for contact info under        Signed, Daune Perch, NP  03/10/2020, 9:04 AM

## 2020-03-10 NOTE — Progress Notes (Signed)
Patient places self on CPAP machine.  RT assistance not needed at this time. ?

## 2020-03-10 NOTE — Consult Note (Addendum)
Cardiology Consultation:   Patient ID: [name] MRN: [id number]; DOB: January 31, 1951  Admit date: 03/07/2020 Date of Consult: 03/10/2020  Primary Care Provider: Shirley, [name], DO Primary Cardiologist: Candee Furbish, MD  Primary Electrophysiologist:  Dr. Curt Bears   Patient Profile:   [name] is a 69 y.o. female with a hx of HTN, DM (type I), HLD, thyroid cancer s/p thyroidectomy, stroke (2016 and 2019), GERD, normal coronaries by cath in 2013, HFpEF, significant atrial ectopy, and via loop found to have AFib who is being seen today for the evaluation of questionable syncope, PACs, bradycardia at the request of Dr. Radford Pax.  History of Present Illness:   [name] is followed outpatient by Dr. Curt Bears and the AFib clinic. She last saw the AFib clinic Nov 2020, at that time observed was hospitalized for DKA and ileus 03/2019 and herloop recorder did show increased AF burden around the time of her admission. She also had a mechanical fall on 07/06/19 with a fracture of he humerus. Per ortho notes, no surgical intervention planned. Her AF burden dramatically increased around the time of her fall but has decreased again over the next several days Her overall Afib burden 0.5%  She was admitted to [facility] 03/08/2020 after a fall vs unclear syncope.  Observations of HR 30's-50's with frequent blocked PACs. Her home toprol 13m daily was held on admission  She has had intermittent c/o CP HS trop 30, 69, 33 and 34 and planned for CT coronaries while here  LABS, most recent K+ 3.7 Mag 1.7 (being replaced) BUN/Creat 9/0.88  TSH 2.978  WBC 3.3 H/H 10/34 Plts 168  Rate/rhythm meds No AAD Home torprol 1065mheld on admission (03/07/20)   EMS reviewed Found her AAO x4, somewhat cool, clammy , strong pulse, bradycardic, initial BP 194/94 EGMs/tracings reviewed, appears junctional rhythm, there are some P waves, though within QRS it seems The others look early in  relation to them.  Thepatient is feeling better today then she has since being here, mentions she had just felt tired since the event.  No CP today, denies any SOB She mentions that she had her covid vaccine, felt fine afterwards.  Once home she was walking with her walker when her shoe slipped off and she tripped.  Once she was helped back up she started to feel hot/ clammy, and lightheaded.  She did not think she fainted, though was told by her sister she was "out" No CP, palpitations or SOB prior to or afterwards. No reports of weak spells, lightheadedness prior to this.  Past Medical History:  Diagnosis Date  . Abnormal echocardiogram   . Anemia   . Arthritis   . BPPV (benign paroxysmal positional vertigo), right 06/13/2017  . Cerebrovascular accident (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 (HC[facility] Cou[facility]11/03/2015  . CHF (congestive heart failure) ([facility]  . Closed fracture of left proximal humerus 07/08/2019  . Depression   . Dizzy 03/07/2020   bradycardia  . GERD (gastroesophageal reflux disease)   . Hemiparesis affecting left side as late effect of cerebrovascular accident (CVA) (HCL[facility]3/15/2021  . Hemorrhoids   . History of Falls with injury 03/08/2020   Fall resulting in left humeral fracture  . Hyperlipidemia   . Hypertension   . Hypothyroidism   . Intracranial vascular stenosis   . Seasonal allergies   . Stroke ([facility]  . Thyroid cancer ([facility]CL[place]   per pt S/p Total Thyroidectomy with Radioactive Iodine Therapy  . Type  1 diabetes mellitus ([place])     Past Surgical History:  Procedure Laterality Date  . ABDOMINAL ANGIOGRAM N/A 05/14/2012   Procedure: ABDOMINAL ANGIOGRAM;  Surgeon: Laverda Page, MD;  Location: [facility] CATH LAB;  Service: Cardiovascular;  Laterality: N/A;  . ABDOMINAL HYSTERECTOMY  1995   partial  . ANKLE FRACTURE SURGERY Right   . CARDIAC CATHETERIZATION     with coronary angiogram  . COLONOSCOPY N/A 09/09/2014   Procedure: COLONOSCOPY;  Surgeon: Gatha Mayer, MD;   Location: [place];  Service: Endoscopy;  Laterality: N/A;  . ESOPHAGOGASTRODUODENOSCOPY (EGD) WITH PROPOFOL N/A 12/03/2018   Procedure: ESOPHAGOGASTRODUODENOSCOPY (EGD) WITH PROPOFOL;  Surgeon: Doran Stabler, MD;  Location: [place];  Service: Gastroenterology;  Laterality: N/A;  . LEFT AND RIGHT HEART CATHETERIZATION WITH CORONARY ANGIOGRAM N/A 05/14/2012   Procedure: LEFT AND RIGHT HEART CATHETERIZATION WITH CORONARY ANGIOGRAM;  Surgeon: Laverda Page, MD;  Location: [facility] CATH LAB;  Service: Cardiovascular;  Laterality: N/A;  . LOOP RECORDER INSERTION N/A 12/23/2018   Procedure: LOOP RECORDER INSERTION;  Surgeon: Constance Haw, MD;  Location: [place] CV LAB;  Service: Cardiovascular;  Laterality: N/A;  . MINI METER   09/08/2014   ELECTRONIC INSULIN PUMP  . TEE WITHOUT CARDIOVERSION N/A 10/16/2018   Procedure: TRANSESOPHAGEAL ECHOCARDIOGRAM (TEE);  Surgeon: Buford Dresser, MD;  Location: [facility] ENDOSCOPY;  Service: Cardiovascular;  Laterality: N/A;  . THYROIDECTOMY  2006     Home Medications:  Prior to Admission medications   Medication Sig Start Date End Date Taking? Authorizing Provider  ACCU-CHEK GUIDE test strip  01/05/20  Yes [provider]  acetaminophen (TYLENOL) 500 MG tablet Take 1,000 mg by mouth as needed for mild pain.   Yes [provider]  apixaban (ELIQUIS) 5 MG TABS tablet Take 1 tablet (5 mg total) by mouth 2 (two) times daily. 01/14/20  Yes Fenton, Clint R, PA  atorvastatin (LIPITOR) 80 MG tablet Take 1 tablet (80 mg total) by mouth daily. Patient taking differently: Take 80 mg by mouth every evening.  06/02/19  Yes Enid Derry, [name], DO  Blood Glucose Monitoring Suppl (ACCU-CHEK GUIDE) w/Device KIT  01/15/20  Yes [provider]  CRANBERRY EXTRACT PO Take 1 capsule by mouth daily.   Yes [provider]  fexofenadine (ALLEGRA) 30 MG tablet Take 30 mg by mouth daily as needed (allergies).    Yes [provider]  FLUoxetine (PROZAC) 10 MG capsule Take 1 capsule (10 mg total) by mouth daily. Patient taking differently: Take 10 mg by mouth daily. Take along with 40m tablet per patient 12/29/19  Yes FGuadalupe Dawn MD  FLUoxetine (PROZAC) 20 MG tablet Take 1 tablet (20 mg total) by mouth daily. Take with 155mfor 3046motal. Patient taking differently: Take 20 mg by mouth daily. Take along with 57m11mblet per patient 12/29/19  Yes FletGuadalupe Dawn  gabapentin (NEURONTIN) 300 MG capsule TAKE ONE CAPSULE BY MOUTH AT BEDTIME 12/23/19  Yes ShirEnid DerryrdMartinique  insulin aspart (NOVOLOG) 100 UNIT/ML injection To be used in patient's insulin pump 01/30/20  Yes ShirEnid DerryrdMartinique  Insulin Human (INSULIN PUMP) SOLN Inject 1 each into the skin continuous. Novolog insulin 01/02/20  Yes FletGuadalupe Dawn  levothyroxine (SYNTHROID, LEVOTHROID) 175 MCG tablet Take 175 mcg by mouth daily before breakfast.  06/06/17  Yes [provider]  losartan (COZAAR) 25 MG tablet TAKE 1 TABLET BY MOUTH AT BEDTIME Patient taking differently: Take 25 mg by mouth at bedtime.  02/19/20  Yes Shirley, [name], DO  metoprolol succinate (TOPROL-XL) 100 MG 24 hr tablet Take 1 tablet (100 mg total) by mouth daily. Take with or immediately following a meal. 11/12/19  Yes Fenton, Clint R, PA  pantoprazole (PROTONIX) 40 MG tablet Take 1 tablet (40 mg total) by mouth 2 (two) times daily before a meal. 12/29/19  Yes Guadalupe Dawn, MD  potassium chloride (K-DUR) 10 MEQ tablet Take 1 tablet by mouth on days toresmide is taken Patient taking differently: Take 10 mEq by mouth daily as needed (For low potassium). Take along with torsemide when taken 03/20/19  Yes Fenton, Clint R, PA  torsemide (DEMADEX) 10 MG tablet TAKE 1 TABLET BY MOUTH DAILY AS NEEDED FOR FLUID Patient taking differently: Take 10 mg by mouth daily as needed (fluid). Take along with potassium when taken 03/14/19  Yes Enid Derry, [name], DO  HYDROcodone-acetaminophen  (NORCO/VICODIN) 5-325 MG tablet Take 2 tablets by mouth every 4 (four) hours as needed. Patient not taking: Reported on 03/07/2020 07/06/19   Recardo Evangelist, PA-C  loperamide (IMODIUM) 2 MG capsule TAKE 1 CAPSULE( 2 MG TOTAL) BY MOUTH FOUR TIMES DAILY AS NEEDED FOR DIARRHEA OR LOOSE STOOLS Patient not taking: Reported on 03/07/2020 12/11/19   Shirley, [name], DO  losartan (COZAAR) 100 MG tablet TAKE 1 TABLET BY MOUTH DAILY 03/09/20   Shirley, [name], DO  ondansetron (ZOFRAN-ODT) 4 MG disintegrating tablet DISSOLVE 1 TABLET BY MOUTH EVERY 8 HOURS AS NEEDED FOR NAUSEA AND VOMITING Patient not taking: Reported on 03/07/2020 09/16/19   Shirley, [name], DO  oxyCODONE (OXY IR/ROXICODONE) 5 MG immediate release tablet Take 1 tablet (5 mg total) by mouth every 8 (eight) hours as needed for severe pain. Patient not taking: Reported on 03/07/2020 07/23/19   Meredith Pel, MD  losartan (COZAAR) 25 MG tablet Take 1 tablet (25 mg total) by mouth at bedtime. 04/29/19   Shirley, [name], DO  ondansetron (ZOFRAN-ODT) 4 MG disintegrating tablet DISSOLVE ONE TABLET BY MOUTH EVERY 8 HOURS AS NEEDED FOR NAUSEA AND VOMITING 09/02/19   Shirley, [name], DO  pantoprazole (PROTONIX) 40 MG tablet Take 1 tablet (40 mg total) by mouth daily. 12/09/18   Shirley, [name], DO    Inpatient Medications: Scheduled Meds: . ALPRAZolam  0.25 mg Oral Once  . amLODipine  5 mg Oral Daily  . apixaban  5 mg Oral BID  . atorvastatin  80 mg Oral q1800  . FLUoxetine  30 mg Oral Daily  . gabapentin  300 mg Oral QHS  . insulin pump   Subcutaneous TID WC, HS, 0200  . levothyroxine  175 mcg Oral Q0600  . losartan  100 mg Oral Daily  . nitroGLYCERIN      . pantoprazole  40 mg Oral BID AC  . polyethylene glycol  17 g Oral Daily  . [START ON 03/11/2020] torsemide  10 mg Oral Daily   Continuous Infusions:  PRN Meds: acetaminophen **OR** acetaminophen, loratadine  Allergies:    Allergies  Allergen Reactions  . Latex Rash    Social  History:   Social History   Socioeconomic History  . Marital status: Divorced    Spouse name: Not on file  . Number of children: 1  . Years of education: Not on file  . Highest education level: Not on file  Occupational History  . Occupation: retired Pharmacist, hospital  Tobacco Use  . Smoking status: Never Smoker  . Smokeless tobacco: Never Used  Substance and Sexual Activity  . Alcohol use: No  . Drug  use: No  . Sexual activity: Not on file  Other Topics Concern  . Not on file  Social History Narrative   Retired Pharmacist, hospital, high school teacher taught mass. One son, helping to care for her granddaughter. No caffeine. Updated 07/20/2014.   Social Determinants of Health   Financial Resource Strain:   . Difficulty of Paying Living Expenses:   Food Insecurity:   . Worried About Charity fundraiser in the Last Year:   . Arboriculturist in the Last Year:   Transportation Needs:   . Film/video editor (Medical):   Marland Kitchen Lack of Transportation (Non-Medical):   Physical Activity:   . Days of Exercise per Week:   . Minutes of Exercise per Session:   Stress:   . Feeling of Stress :   Social Connections:   . Frequency of Communication with Friends and Family:   . Frequency of Social Gatherings with Friends and Family:   . Attends Religious Services:   . Active Member of Clubs or Organizations:   . Attends Archivist Meetings:   Marland Kitchen Marital Status:   Intimate Partner Violence:   . Fear of Current or Ex-Partner:   . Emotionally Abused:   Marland Kitchen Physically Abused:   . Sexually Abused:     Family History:   Family History  Problem Relation Age of Onset  . Heart disease Mother   . Hyperlipidemia Mother   . Hypertension Mother   . Renal Disease Mother        insufficiency  . Heart attack Mother        in his 94's  . Early death Father        Head Injury at Work  . Hyperlipidemia Father   . Hypertension Father   . Stroke Son   . Diabetes Maternal Aunt   . Prostate cancer Paternal  Uncle   . Heart disease Maternal Aunt   . Heart failure Maternal Grandfather   . Heart disease Maternal Grandfather   . Heart attack Maternal Grandfather        Died in his 92's  . Breast cancer Neg Hx      ROS:  Please see the history of present illness.  All other ROS reviewed and negative.     Physical Exam/Data:   Vitals:   03/09/20 2030 03/10/20 0500 03/10/20 0826 03/10/20 0940  BP: (!) 178/60 (!) 176/63 (!) 162/56 (!) 180/64  Pulse: (!) 52 (!) 55    Resp: 18 18 17 17   Temp: 98.1 F (36.7 C) 98.3 F (36.8 C)    TempSrc: Oral Oral    SpO2: 96% 93%    Weight:  91.9 kg    Height:        Intake/Output Summary (Last 24 hours) at 03/10/2020 1145 Last data filed at 03/10/2020 0800 Gross per 24 hour  Intake 970 ml  Output 400 ml  Net 570 ml   Last 3 Weights 03/10/2020 03/09/2020 03/08/2020  Weight (lbs) 202 lb 11.2 oz 238 lb 12.1 oz 229 lb 4.5 oz  Weight (kg) 91.944 kg 108.3 kg 104 kg     Body mass index is 31.75 kg/m.  General:  Well nourished, well developed, in no acute distress HEENT: normal Lymph: no adenopathy Neck: no JVD Endocrine:  No thryomegaly Vascular: No carotid bruits Cardiac:  RRR; 1/6 SM< no gallops or rubs Lungs:  CTA b/l, no wheezing, rhonchi or rales  Abd: soft, nontender, obese  Ext: trace edema Musculoskeletal:  No deformities Skin: warm and dry  Neuro: no gross focal abnormalities noted Psych:  Normal affect   EKG:  The EKG was personally reviewed and demonstrates:    SB, 59, normal intervals, blocks PACs, PVC, no ischemic looking changes SB (sinus rate is 56), blocked PACs SB 46, blocked PACs, though there looks to be some PR prolongation, without drop, PVC Another SB 46, appears to have PR prolongation, though not with and looks early as well  Telemetry:  Telemetry was personally reviewed and demonstrates:   SR, 60's-70's today, previously base rates were high 40's-50's Blocked PACs, these are intermittently frequent causing V rates  to dip to 30's-40's transiently  Relevant CV Studies:   Echocardiogram 03/08/2020 IMPRESSIONS  1. Left ventricular ejection fraction, by estimation, is 60 to 65%. Left  ventricular ejection fraction by PLAX is 63 %. The left ventricle has  normal function. The left ventricle has no regional wall motion  abnormalities. Left ventricular diastolic  parameters are indeterminate.  2. Right ventricular systolic function is normal. The right ventricular  size is normal. There is moderately elevated pulmonary artery systolic  pressure.  3. Left atrial size was mildly dilated.  4. The mitral valve is normal in structure. Mild mitral valve  regurgitation. No evidence of mitral stenosis.  5. Tricuspid valve regurgitation is mild to moderate.  6. The aortic valve is tricuspid. Aortic valve regurgitation is not  visualized. No aortic stenosis is present.  7. The inferior vena cava is dilated in size with >50% respiratory  variability, suggesting right atrial pressure of 8 mmHg.    10/14/18: TTE Study Conclusions - Procedure narrative: Transthoracic echocardiography. Technically difficult study. Intravenous contrast (Definity) was administered. - Left ventricle: The cavity size was normal. There was moderate concentric hypertrophy. Systolic function was normal. The estimated ejection fraction was in the range of 60% to 65%. Wall motion was normal; there were no regional wall motion abnormalities. The study is not technically sufficient to allow evaluation of LV diastolic function. - Left atrium: The atrium was normal in size. - Right atrium: The atrium was normal in size. Impressions: - Technically difficult study. Afib is noted. LVEF 60-65%, moderate LVH, normal wall motion, normal biatrial size.  Laboratory Data:  High Sensitivity Troponin:   Recent Labs  Lab 03/07/20 1505 03/07/20 1934 03/08/20 0712 03/08/20 0842  TROPONINIHS 30* 69* 33* 34*       Chemistry Recent Labs  Lab 03/08/20 0338 03/09/20 0801 03/10/20 0431  NA 144 140 139  K 3.8 3.9 3.7  CL 110 107 102  CO2 21* 24 25  GLUCOSE 148* 215* 205*  BUN 11 11 9   CREATININE 0.91 1.05* 0.88  CALCIUM 8.6* 8.6* 8.6*  GFRNONAA >60 54* >60  GFRAA >60 >60 >60  ANIONGAP 13 9 12     Recent Labs  Lab 03/07/20 1505 03/08/20 0338  PROT 6.1* 5.4*  ALBUMIN 3.1* 2.7*  AST 68* 38  ALT 54* 39  ALKPHOS 107 98  BILITOT 0.5 0.9   Hematology Recent Labs  Lab 03/07/20 1505  WBC 3.3*  RBC 3.77*  HGB 10.8*  HCT 34.7*  MCV 92.0  MCH 28.6  MCHC 31.1  RDW 12.8  PLT 168   BNPNo results for input(s): BNP, PROBNP in the last 168 hours.  DDimer No results for input(s): DDIMER in the last 168 hours.   Radiology/Studies:   CT HEAD WO CONTRAST Result Date: 03/07/2020 CLINICAL DATA:  Bradycardia and syncope. EXAM: CT HEAD WITHOUT CONTRAST TECHNIQUE: Contiguous  axial images were obtained from the base of the skull through the vertex without intravenous contrast. COMPARISON:  October 13, 2018 FINDINGS: Brain: There is mild cerebral atrophy with widening of the extra-axial spaces and ventricular dilatation. There are areas of decreased attenuation within the white matter tracts of the supratentorial brain, consistent with microvascular disease changes. Small chronic lacunar infarcts are seen within the pons. Vascular: No hyperdense vessel or unexpected calcification. Skull: Normal. Negative for fracture or focal lesion. Sinuses/Orbits: No acute finding. Other: None. IMPRESSION: 1. Generalized cerebral atrophy. 2. No acute intracranial abnormality. Electronically Signed   By: Virgina Norfolk M.D.   On: 03/07/2020 18:05    DG Chest Port 1 View Result Date: 03/07/2020 CLINICAL DATA:  Bradycardia EXAM: PORTABLE CHEST 1 VIEW COMPARISON:  04/15/2019 FINDINGS: Cardiomegaly. Implantable loop recorder. Both lungs are clear. The visualized skeletal structures are unremarkable. IMPRESSION:  Cardiomegaly without acute abnormality of the lungs in AP portable projection. Electronically Signed   By: Eddie Candle M.D.   On: 03/07/2020 15:03      Assessment and Plan:   1. Possible syncope      Initial event the patient feels was a slip/trip with her shoe, though once back up and seated became weak Perhaps vagally mediated when being helped up/bearing down to stand? She was bradycardic (30's-50's) on EMS arrival and throughout transport by EMS with no further symptoms  2. Paroxysmal AFib     CHA2DS2Vasc is 7, on Eliquis appropriately dosed      Loop recorer interrogation this admission AF burden has been minimal of late, much better since Aug 2020, overall burden 7.0% (since Jan 2020) I also note that tracings printed of AF episodes are false and 2/2 PACs and blocked PACs  3. Bradycardia     2/2 blocked PACs     No clear evidence of any significant conduction system disease     Await he coronary/ischemic w/u to decide on management of her atrial ectopy      Her baseline HR today is up (off her Toprol), and she seems to feel better as well. I suspect we may end up with lower dose of BB with an AAD   Dr. Curt Bears Claudius Mich see her later today    For questions or updates, please contact [facility] Please consult [email or web address] for contact info under     Signed, Baldwin Jamaica, PA-C  03/10/2020 11:45 AM  I have seen and examined this patient with Tommye Standard.  Agree with above, note added to reflect my findings.  On exam, RRR, no murmurs, lungs clear.  Patient admitted after a fall.  Fall appears mechanical, but she did develop what appears to be a junctional rhythm afterwards.  She does have history of PACs which are nonconducted.  For her PACs, would plan to decrease her Toprol-XL to 50 mg.  She has a CT scan pending.  If her CT shows no evidence of coronary artery disease, Emy Angevine likely plan for flecainide initiation.    Kavish Lafitte M. Laquitha Heslin MD 03/10/2020 5:55 PM

## 2020-03-10 NOTE — Progress Notes (Signed)
PT Cancellation Note  Patient Details Name: [name] MRN: [id number] DOB: 23-Jan-1951   Cancelled Treatment:    Reason Eval/Treat Not Completed: Patient at procedure or test/unavailable will follow up as schedule allows.   Lou Miner, DPT  Acute Rehabilitation Services  Pager: [phone] Office: [phone]    Rudean Hitt 03/10/2020, 11:25 AM

## 2020-03-10 NOTE — Progress Notes (Signed)
Family Medicine Teaching Service Daily Progress Note Intern Pager: [phone]  Patient name: [name] Medical record number: [id number] Date of birth: [birth year] Age: 69 y.o. Gender: female  Primary Care Provider: Shirley, [name], DO Consultants: Cardiology Code Status: FULL   Pt Overview and Major Events to Date:  3/14: Admitted  Assessment and Plan:  Symptomatic bradycardia-unclear etiology Doing well this morning. Denies dizziness HR: 50-60s, sinus bradycardia with frequent non-conducted PACs. -Continuous telemetry and pulse oximetry  -Vitals per floor routine -F/u Cardiology recommendations Losartan increased to 100mg  yesterday. Also doing ischemic workup. Coronary CTA to assess for underlying CAD. Pending electrophysiology evaluation today. -Up with assistance -PT/OT  Hypokalemia   K 3.7 today -Monitor with daily BMP  Hypomagnesemia Mg 1.7 -Repleted today per cardiology  -Daily Mg, replete as necessary  H/o HFpEF Pt has reporting peripheral edema worsening. On exam: bilateral pitting edema of LE and hemosiderin deposits.. Echo: Left ejection fraction 60 to 65% -Home meds include torsemide 10 mg as needed for swelling -Cardiology started Torsemide 10mg    H/o CVA with residual left sided weakness  CT head: no acute abnormalities  -Neurological status  Hypothyroidism Last TSH in 2019-2.47 Home meds: Levothyroxine 175 mcg once daily except Sundays -TSH today, consider T3-T4 if necessary -Continue Levothyroxine  T1DM CBGs 178, 238. Last A1c 8.4 on admission.  Sees Dr. Debbora Presto for endocrinology. Patient endorses peripheral neuropathy. Could also have autonomic reflex issues contributing to fall. Home meds: Insulin aspart via patient's insulin pump -Continue insulin pump - monitor CBGs  Normocytic anemia  leukopenia Hgb 10.8, WBC 3.3 on admission -Montitor with CBC  Afib HR 50-60s Home meds include: Eliquis 5 mg BID, metoprolol 100 mg XL  once daily -Hold metoprolol -Continue Eliquis    HTN Bps have been persistently elevated. [id number],  DBP 56-63 Home meds: Metoprolol 100 mg once daily -Hold metoprolol due to bradycardia -Cardiology following, appreciate recommendations increased Losartan 100mg  on 3/16 -Monitor Bps  GERD Home meds include Protonix 40 mg -Continue Protonix  Depression- home meds: fluoxetine 30mg  daily -Continue fluoxetine  Hypercholesterolemia Home meds include atorvastatin 80 mg -Continue statin  Elevated transaminases-improved AST 68>38, ALT 54>39  -CMP a.m.  Chronic Back Pain Home meds include: Gabapentin 300mg  qHS -Continue gabapentin 300 mg  FEN/GI: Heart healthy, Protonix Prophylaxis: Lovenox   Disposition: IP for next 1-2 days   Subjective:  Reports LE pain due to swelling  Objective: Temp:  [98.1 F (36.7 C)-98.3 F (36.8 C)] 98.3 F (36.8 C) (03/17 0500) Pulse Rate:  [52-55] 55 (03/17 0500) Resp:  [18] 18 (03/17 0500) BP: (149-179)/(60-74) 176/63 (03/17 0500) SpO2:  [93 %-96 %] 93 % (03/17 0500) Weight:  [91.9 kg] 91.9 kg (03/17 0500)   Physical Exam:  General: Alert and cooperative and appears to be in no acute distress HEENT: Neck non-tender without lymphadenopathy, masses or thyromegaly Cardio: Normal S1 and S2, irregular. No murmurs or rubs.   Pulm: Clear to auscultation bilaterally, no crackles, wheezing, or diminished breath sounds. Normal respiratory effort Abdomen: Bowel sounds normal. Abdomen soft and non-tender.  Extremities Bilateral pitting edema 2+ with hemosiderin skin changes. Tender on palpation. Neuro: Cranial nerves grossly intact  Laboratory: Recent Labs  Lab 03/07/20 1505  WBC 3.3*  HGB 10.8*  HCT 34.7*  PLT 168   Recent Labs  Lab 03/07/20 1505 03/07/20 1505 03/08/20 0338 03/09/20 0801 03/10/20 0431  NA 141   < > 144 140 139  K 3.5   < > 3.8 3.9 3.7  CL  110   < > 110 107 102  CO2 26   < > 21* 24 25  BUN 12   < > 11 11 9    CREATININE 0.99   < > 0.91 1.05* 0.88  CALCIUM 8.6*   < > 8.6* 8.6* 8.6*  PROT 6.1*  --  5.4*  --   --   BILITOT 0.5  --  0.9  --   --   ALKPHOS 107  --  98  --   --   ALT 54*  --  39  --   --   AST 68*  --  38  --   --   GLUCOSE 189*   < > 148* 215* 205*   < > = values in this interval not displayed.     Imaging/Diagnostic Tests:   Lattie Haw, MD 03/10/2020, 8:27 AM PGY-1, [facility] Intern pager: [phone], text pages welcome

## 2020-03-10 NOTE — Evaluation (Signed)
Occupational Therapy Evaluation Patient Details Name: [name] MRN: [id number] DOB: 07-31-1951 Today's Date: 03/10/2020    History of Present Illness Pt is a 69 y.o. female presenting with fall and near syncope episode. PMHx is significant for type 1 diabetes, A. fib, history of CVAs, hypothyroidism, hypertension, hyperlipidemia, history of HFpEF, GERD, depression/anxiety, chronic back pain.   Clinical Impression   This 69 y/o female presents with the above. PTA pt reports using power scooter and rollator for mobility (reports takes rollator to bathroom, using power scooter for other mobility, and reports she receives intermittent assist for LB dressing ADL; she does endorse hx of falls. Pt now presenting with weakness, decreased mobility status, impaired cognition. She currently requires two person assist for safe completion of sit<>stand and stand pivot transfers, requiring modA (+2) for LB ADL and setup/minguard assist for seated UB ADL. Pt denies dizziness throughout session and with BP stable. Pt reports she lives with her daughter but reports daughter works during the day and she is typically home by herself. Pt will benefit from continued acute OT services, given current status and decreased caregiver support at home feel she will benefit from additional therapy services in SNF setting after discharge prior to return home as she is at a higher risk for falls. Will follow.     Follow Up Recommendations  SNF;Supervision/Assistance - 24 hour    Equipment Recommendations  Other (comment);Wheelchair (measurements OT);Wheelchair cushion (measurements OT)(TBD)           Precautions / Restrictions Precautions Precautions: Fall Precaution Comments: Has had multiple falls; one where she broke her arm Restrictions Weight Bearing Restrictions: No      Mobility Bed Mobility Overal bed mobility: Needs Assistance Bed Mobility: Supine to Sit     Supine to sit: Min assist      General bed mobility comments: for trunk support, increased time/effort to scoot towards EOB  Transfers Overall transfer level: Needs assistance Equipment used: Rolling walker (2 wheeled) Transfers: Sit to/from [facility] Sit to Stand: Mod assist;+2 physical assistance Stand pivot transfers: Min assist;+2 physical assistance       General transfer comment: pt required heavy boosting assist from EOB, increased time for transition of hands to RW, pt able to tolerate standing for BP reading prior to taking few steps to recliner, she requires steadying assist throughout due to unsteadiness     Balance Overall balance assessment: Needs assistance Sitting-balance support: Feet supported Sitting balance-Leahy Scale: Fair     Standing balance support: Bilateral upper extremity supported Standing balance-Leahy Scale: Poor Standing balance comment: reliant on UE support/external assist                           ADL either performed or assessed with clinical judgement   ADL Overall ADL's : Needs assistance/impaired Eating/Feeding: Set up;Sitting   Grooming: Min guard;Set up;Sitting   Upper Body Bathing: Min guard;Set up;Sitting   Lower Body Bathing: Moderate assistance;+2 for physical assistance;Sit to/from stand   Upper Body Dressing : Set up;Min guard;Sitting   Lower Body Dressing: Moderate assistance;Sit to/from stand;+2 for physical assistance   Toilet Transfer: Minimal assistance;+2 for physical assistance;Stand-pivot;RW Toilet Transfer Details (indicate cue type and reason): simulated via transfer to [place] and Hygiene: Moderate assistance;+2 for physical assistance;Sit to/from stand       Functional mobility during ADLs: Minimal assistance;Moderate assistance;+2 for physical assistance;Rolling walker General ADL Comments: pt with weakness, decreased mobility status,  impaired cognition     Vision          Perception     Praxis      Pertinent Vitals/Pain Pain Assessment: No/denies pain     Hand Dominance Right   Extremity/Trunk Assessment Upper Extremity Assessment Upper Extremity Assessment: LUE deficits/detail LUE Deficits / Details: pt with residual L side weakness from previous CVA, decreased fine motor strength, decreased sensation LUE Sensation: decreased light touch LUE Coordination: decreased fine motor;decreased gross motor   Lower Extremity Assessment Lower Extremity Assessment: Defer to PT evaluation       Communication Communication Communication: No difficulties   Cognition Arousal/Alertness: Awake/alert Behavior During Therapy: WFL for tasks assessed/performed Overall Cognitive Status: No family/caregiver present to determine baseline cognitive functioning                                 General Comments: pt  with difficulty clarifying PLOF, decreased insight into safety given current deficits   General Comments  BP stable throughout, pt denies dizziness     Exercises     Shoulder Instructions      Home Living Family/patient expects to be discharged to:: Private residence Living Arrangements: Other relatives(sister) Available Help at Discharge: Family;Available PRN/intermittently Type of Home: House Home Access: Ramped entrance     Home Layout: One level     Bathroom Shower/Tub: Occupational psychologist: Handicapped height     Home Equipment: Environmental consultant - 4 wheels;Electric scooter;Shower seat;Bedside commode          Prior Functioning/Environment Level of Independence: Needs assistance  Gait / Transfers Assistance Needed: Uses scooter for some mobility and uses rollator some inside of house. Needs help with transfers sometimes.  ADL's / Homemaking Assistance Needed: Pt sponge bathes, granddaughter helps with getting shoes on and washing back.             OT Problem List: Decreased strength;Decreased activity  tolerance;Impaired balance (sitting and/or standing);Decreased cognition;Decreased safety awareness;Decreased knowledge of use of DME or AE;Cardiopulmonary status limiting activity;Obesity;Impaired UE functional use;Decreased knowledge of precautions      OT Treatment/Interventions: Self-care/ADL training;Therapeutic exercise;Energy conservation;DME and/or AE instruction;Therapeutic activities;Patient/family education;Balance training;Cognitive remediation/compensation    OT Goals(Current goals can be found in the care plan section) Acute Rehab OT Goals Patient Stated Goal: wants to return home OT Goal Formulation: With patient Time For Goal Achievement: 03/24/20 Potential to Achieve Goals: Good  OT Frequency: Min 2X/week   Barriers to D/C:            Co-evaluation PT/OT/SLP Co-Evaluation/Treatment: Yes Reason for Co-Treatment: To address functional/ADL transfers;For patient/therapist safety PT goals addressed during session: Mobility/safety with mobility;Balance;Proper use of DME OT goals addressed during session: Strengthening/ROM      AM-PAC OT "6 Clicks" Daily Activity     Outcome Measure Help from another person eating meals?: A Little Help from another person taking care of personal grooming?: A Little Help from another person toileting, which includes using toliet, bedpan, or urinal?: A Lot Help from another person bathing (including washing, rinsing, drying)?: A Lot Help from another person to put on and taking off regular upper body clothing?: A Little Help from another person to put on and taking off regular lower body clothing?: A Lot 6 Click Score: 15   End of Session Equipment Utilized During Treatment: Gait belt;Rolling walker Nurse Communication: Mobility status  Activity Tolerance: Patient tolerated treatment well Patient left: in chair;with call  bell/phone within reach;with nursing/sitter in room(NT in room)  OT Visit Diagnosis: Muscle weakness (generalized)  (M62.81);History of falling (Z91.81);Unsteadiness on feet (R26.81)                Time: [id number] OT Time Calculation (min): 22 min Charges:  OT General Charges $OT Visit: 1 Visit OT Evaluation $OT Eval Moderate Complexity: [place], OT Acute Rehabilitation Services Pager [phone] Office [place] 03/10/2020, 5:18 PM

## 2020-03-11 ENCOUNTER — Other Ambulatory Visit: Payer: Self-pay | Admitting: Cardiology

## 2020-03-11 ENCOUNTER — Telehealth: Payer: Self-pay

## 2020-03-11 ENCOUNTER — Encounter: Payer: Self-pay | Admitting: Family Medicine

## 2020-03-11 DIAGNOSIS — I5032 Chronic diastolic (congestive) heart failure: Secondary | ICD-10-CM

## 2020-03-11 LAB — CBC
HCT: 34.2 % — ABNORMAL LOW (ref 36.0–46.0)
Hemoglobin: 10.9 g/dL — ABNORMAL LOW (ref 12.0–15.0)
MCH: 28.9 pg (ref 26.0–34.0)
MCHC: 31.9 g/dL (ref 30.0–36.0)
MCV: 90.7 fL (ref 80.0–100.0)
Platelets: 178 10*3/uL (ref 150–400)
RBC: 3.77 MIL/uL — ABNORMAL LOW (ref 3.87–5.11)
RDW: 12.8 % (ref 11.5–15.5)
WBC: 4.4 10*3/uL (ref 4.0–10.5)
nRBC: 0 % (ref 0.0–0.2)

## 2020-03-11 LAB — BASIC METABOLIC PANEL
Anion gap: 9 (ref 5–15)
BUN: 9 mg/dL (ref 8–23)
CO2: 26 mmol/L (ref 22–32)
Calcium: 8.5 mg/dL — ABNORMAL LOW (ref 8.9–10.3)
Chloride: 104 mmol/L (ref 98–111)
Creatinine, Ser: 0.97 mg/dL (ref 0.44–1.00)
GFR calc Af Amer: >60 mL/min (ref >60)
GFR calc non Af Amer: 60 mL/min — ABNORMAL LOW (ref >60)
Glucose, Bld: 217 mg/dL — ABNORMAL HIGH (ref 70–99)
Potassium: 3.7 mmol/L (ref 3.5–5.1)
Sodium: 139 mmol/L (ref 135–145)

## 2020-03-11 LAB — GLUCOSE, CAPILLARY
Glucose-Capillary: 177 mg/dL — ABNORMAL HIGH (ref 70–99)
Glucose-Capillary: 175 mg/dL — ABNORMAL HIGH (ref 70–99)
Glucose-Capillary: 232 mg/dL — ABNORMAL HIGH (ref 70–99)
Glucose-Capillary: 202 mg/dL — ABNORMAL HIGH (ref 70–99)
Glucose-Capillary: 234 mg/dL — ABNORMAL HIGH (ref 70–99)

## 2020-03-11 LAB — MAGNESIUM: Magnesium: 2.1 mg/dL (ref 1.7–2.4)

## 2020-03-11 MED ORDER — TORSEMIDE 20 MG PO TABS
20.0000 mg | ORAL_TABLET | Freq: Once | ORAL | Status: AC
  Administered 2020-03-11: 21:00:00 20 mg via ORAL
  Filled 2020-03-11: qty 1

## 2020-03-11 MED ORDER — DRONEDARONE HCL 400 MG PO TABS: 400.0000 mg | ORAL_TABLET | Freq: Two times a day (BID) | ORAL | Status: DC

## 2020-03-11 MED ORDER — METOPROLOL SUCCINATE ER 50 MG PO TB24
50.0000 mg | ORAL_TABLET | Freq: Every day | ORAL | Status: DC
  Administered 2020-03-11 – 2020-03-12 (×2): 50 mg via ORAL
  Filled 2020-03-11 (×3): qty 1

## 2020-03-11 NOTE — TOC Initial Note (Signed)
Transition of Care [facility]) - Initial/Assessment Note    Patient Details  Name: [name] MRN: [id number] Date of Birth: 12-05-1951  [facility] CM/SW Contact:    Arvella Merles, LCSW Phone Number: 03/11/2020, 12:42 PM  Clinical Narrative:                 CSW received consult for possible SNF placement at time of discharge. CSW met bedside with patient and discussed PT recommendation of SNF placement at time of discharge. Patient expressed understanding of PT recommendation and is agreeable to SNF placement at time of discharge. Patient reports she does not want to go to [facility], but has no other preferences. CSW discussed insurance authorization process and provided Medicare SNF ratings list. No further questions reported at this time. TOC team will continue to follow and assist with discharge planning needs.\   Expected Discharge Plan: Skilled Nursing Facility Barriers to Discharge: Continued Medical Work up, Ship broker   Patient Goals and CMS Choice Patient states their goals for this hospitalization and ongoing recovery are:: To return home CMS Medicare.gov Compare Post Acute Care list provided to:: Patient Choice offered to / list presented to : Patient  Expected Discharge Plan and Services Expected Discharge Plan: [place]       Living arrangements for the past 2 months: Single Family Home                                      Prior Living Arrangements/Services Living arrangements for the past 2 months: Single Family Home Lives with:: Siblings Patient language and need for interpreter reviewed:: Yes Do you feel safe going back to the place where you live?: Yes      Need for Family Participation in Patient Care: No (Comment) Care giver support system in place?: Yes (comment)   Criminal Activity/Legal Involvement Pertinent to Current Situation/Hospitalization: No - Comment as needed  Activities  of Daily Living Home Assistive Devices/Equipment: Insulin Pump, Electric scooter ADL Screening (condition at time of admission) Patient's cognitive ability adequate to safely complete daily activities?: Yes Is the patient deaf or have difficulty hearing?: No Does the patient have difficulty seeing, even when wearing glasses/contacts?: No Does the patient have difficulty concentrating, remembering, or making decisions?: No Patient able to express need for assistance with ADLs?: Yes Does the patient have difficulty dressing or bathing?: No Independently performs ADLs?: No Communication: Independent Dressing (OT): Independent Grooming: Independent Feeding: Independent Bathing: Independent Toileting: Independent In/Out Bed: Needs assistance Walks in Home: Independent with device (comment) Is this a change from baseline?: Pre-admission baseline Does the patient have difficulty walking or climbing stairs?: Yes Weakness of Legs: Left Weakness of Arms/Hands: Left  Permission Sought/Granted Permission sought to share information with : Facility Sport and exercise psychologist, Family Supports Permission granted to share information with : Yes, Verbal Permission Granted  Share Information with NAME: [name]  Permission granted to share info w AGENCY: SNFs  Permission granted to share info w Relationship: Sister  Permission granted to share info w Contact Information: [phone]  Emotional Assessment Appearance:: Appears stated age Attitude/Demeanor/Rapport: Engaged Affect (typically observed): Appropriate Orientation: : Oriented to Self, Oriented to Place, Oriented to  Time, Oriented to Situation Alcohol / Substance Use: Not Applicable Psych Involvement: No (comment)  Admission diagnosis:  Bradycardia [R00.1] Hyperglycemia [R73.9] Syncope, unspecified syncope type [R55] Anemia, unspecified type [D64.9] Patient Active Problem List  Diagnosis Date Noted  . Chest tightness   . Near syncope  03/08/2020  . Chronic anticoagulation 03/08/2020  . Hemiparesis and numbness affecting left side as late effect of cerebrovascular accident (CVA) ([place]) 03/08/2020  . History of Falls with injury 03/08/2020  . Hypoalbuminemia 03/08/2020  . Proteinuria 03/08/2020  . Heart block atrioventricular 03/08/2020  . Bradycardia 03/07/2020  . Dependent for wheelchair mobility 08/05/2019  . OSA (obstructive sleep apnea) 05/27/2019  . Paroxysmal atrial fibrillation ([place]) 05/27/2019  . Insulin pump in place   . (HFpEF) heart failure with preserved ejection fraction ([place]) 03/17/2019  . 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 Hypertension associated with diabetes ([place]) 10/29/2015  . 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 ([place])   . Lower extremity edema 08/20/2013  . Morbid obesity ([place]) 05/29/2013  . 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 ([place]) 63/84/5364  . Hypothyroidism 01/25/2012  . GERD (gastroesophageal reflux disease) 01/25/2012   PCP:  Shirley, [name], DO Pharmacy:   [facility] [place], [address] [place] [address] Phone: [phone] Fax: [phone]     Social Determinants of Health (SDOH) Interventions    Readmission Risk Interventions Readmission Risk Prevention Plan 04/17/2019  Transportation Screening Complete  PCP or Specialist Appt within 3-5 Days Complete  HRI or [place] Complete  Social Work Consult for [place] Planning/Counseling Complete  Palliative Care Screening Not Applicable  Medication Review Press photographer) Complete  Some recent data might be hidden

## 2020-03-11 NOTE — Progress Notes (Signed)
Patient refused BiPAP for the night.  

## 2020-03-11 NOTE — Progress Notes (Signed)
Progress Note  Patient Name: [name] Date of Encounter: 03/11/2020  Primary Cardiologist: Candee Furbish, MD   Subjective   Pt is feeling well, no chest discomfort, shortness of breath, palpitations or near syncope.   Inpatient Medications    Scheduled Meds:  ALPRAZolam  0.25 mg Oral Once   amLODipine  5 mg Oral Daily   apixaban  5 mg Oral BID   atorvastatin  80 mg Oral q1800   FLUoxetine  30 mg Oral Daily   gabapentin  300 mg Oral QHS   insulin pump   Subcutaneous TID WC, HS, 0200   levothyroxine  175 mcg Oral Q0600   losartan  100 mg Oral Daily   pantoprazole  40 mg Oral BID AC   polyethylene glycol  17 g Oral Daily   torsemide  10 mg Oral Daily   Continuous Infusions:  PRN Meds: acetaminophen **OR** acetaminophen, loratadine   Vital Signs    Vitals:   03/10/20 1600 03/10/20 1608 03/10/20 1951 03/11/20 0500  BP: (!) 179/68 (!) 172/73 (!) 160/55 (!) 160/65  Pulse:   61 (!) 57  Resp: 20  20 20   Temp:   99.3 F (37.4 C) 98.9 F (37.2 C)  TempSrc:   Axillary Oral  SpO2:   98% 100%  Weight:    103.9 kg  Height:        Intake/Output Summary (Last 24 hours) at 03/11/2020 0850 Last data filed at 03/11/2020 0805 Gross per 24 hour  Intake 120 ml  Output 1350 ml  Net -1230 ml   Last 3 Weights 03/11/2020 03/10/2020 03/09/2020  Weight (lbs) 229 lb 202 lb 11.2 oz 238 lb 12.1 oz  Weight (kg) 103.874 kg 91.944 kg 108.3 kg      Telemetry    Sinus rhythm with 1st degree block with PACs. No longer having blocked PACs - Personally Reviewed  ECG    No new tracings for reveiw  Physical Exam   GEN: No acute distress.   Neck: No JVD Cardiac: RRR, no murmurs, rubs, or gallops.  Respiratory: Clear to auscultation bilaterally. GI: Soft, nontender, non-distended  MS: No edema; No deformity. Neuro:  Nonfocal  Psych: Normal affect   Labs    High Sensitivity Troponin:   Recent Labs  Lab 03/07/20 1505 03/07/20 1934 03/08/20 0712  03/08/20 0842  TROPONINIHS 30* 69* 33* 34*      Chemistry Recent Labs  Lab 03/07/20 1505 03/07/20 1505 03/08/20 0338 03/08/20 0338 03/09/20 0801 03/10/20 0431 03/11/20 0417  NA 141   < > 144   < > 140 139 139  K 3.5   < > 3.8   < > 3.9 3.7 3.7  CL 110   < > 110   < > 107 102 104  CO2 26   < > 21*   < > 24 25 26   GLUCOSE 189*   < > 148*   < > 215* 205* 217*  BUN 12   < > 11   < > 11 9 9   CREATININE 0.99   < > 0.91   < > 1.05* 0.88 0.97  CALCIUM 8.6*   < > 8.6*   < > 8.6* 8.6* 8.5*  PROT 6.1*  --  5.4*  --   --   --   --   ALBUMIN 3.1*  --  2.7*  --   --   --   --   AST 68*  --  38  --   --   --   --  ALT 54*  --  39  --   --   --   --   ALKPHOS 107  --  98  --   --   --   --   BILITOT 0.5  --  0.9  --   --   --   --   GFRNONAA 58*   < > >60   < > 54* >60 60*  GFRAA >60   < > >60   < > >60 >60 >60  ANIONGAP 5   < > 13   < > 9 12 9    < > = values in this interval not displayed.     Hematology Recent Labs  Lab 03/07/20 1505 03/11/20 0417  WBC 3.3* 4.4  RBC 3.77* 3.77*  HGB 10.8* 10.9*  HCT 34.7* 34.2*  MCV 92.0 90.7  MCH 28.6 28.9  MCHC 31.1 31.9  RDW 12.8 12.8  PLT 168 178    BNPNo results for input(s): BNP, PROBNP in the last 168 hours.   DDimer No results for input(s): DDIMER in the last 168 hours.   Radiology    CT CORONARY MORPH W/CTA COR W/SCORE W/CA W/CM &/OR WO/CM  Addendum Date: 03/10/2020   ADDENDUM REPORT: 03/10/2020 15:03 HISTORY: Chest pain, atrial fibrillation, frequent PACs EXAM: Cardiac/Coronary  CT TECHNIQUE: The patient was scanned on a [employer]. PROTOCOL: A 120 kV prospective scan was triggered in the descending thoracic aorta at 111 HU's. Axial non-contrast 3 mm slices were carried out through the heart. The data set was analyzed on a dedicated work station and scored using the [place]. Gantry rotation speed was 250 msecs and collimation was .6 mm. Beta blockade and 0.8 mg of sl NTG was given. The 3D data set was  reconstructed in 5% intervals of the 67-82 % of the R-R cycle. Diastolic phases were analyzed on a dedicated work station using MPR, MIP and VRT modes. The patient received 29mL OMNIPAQUE IOHEXOL 350 MG/ML SOLN of contrast on initial scan, and then was rebolused due to motion artifact and rescanned with retrospective ECG gating and repeat nitroglycerin dosing. FINDINGS: Arrhythmia contributes to motion artifact that degrades the diagnostic accuracy of this exam. Coronary calcium score: The patient's coronary artery calcium score is 1371, which places the patient in the 99th percentile. Motion artifact from arrhythmia impacts the quantitation of coronary calcium which maybe overestimated. Coronary arteries: Normal coronary origins.  Right dominance. Right Coronary Artery: Mild ostial mixed atherosclerotic plaque, 25-49% stenosis. Tubular, long segment mixed atherosclerotic plaque in the proximal to mid RCA with possible moderate stenosis, 50-69% stenosis. Mid vessel is obscured by motion artifact, cannot accurately determine luminal stenosis. Mild mixed atherosclerotic plaque in the mid-distal RCA. Moderate atherosclerotic plaque in the distal RCA (50-69% stenosis) with a possible focal atherosclerotic plaque with severe stenosis in the distal RCA, 70-99% stenosis. PDA and PL appear patient. There is ostial PL mixed atherosclerotic plaque, luminal assessment challenging due to image resolution but appears moderate (50-69% stenosis). Left Main Coronary Artery: Ostial mixed atherosclerotic plaque with minimal stenosis, <25%. Distal mixed atherosclerotic plaque with mild stenosis, 25-49% stenosis. Left Anterior Descending Coronary Artery: Moderate mixed atherosclerotic plaque in the proximal LAD, 50-69% stenosis. Moderate mid LAD mixed atherosclerotic plaque, 50-69%. Left Circumflex Artery: Mild atherosclerotic plaque in the proximal L circumflex artery, 25-49% stenosis. Distal vessel appears patent and small caliber.  Aorta: Normal size, 29 mm at the mid ascending aorta (level of the PA bifurcation) measured double oblique. No calcifications. No  dissection. Aortic Valve: No calcifications.  Mild annular calcifications. Other findings: Normal pulmonary vein drainage into the left atrium. Normal left atrial appendage without a thrombus. Mild dilation of main pulmonary artery, 31 mm. Lipomatous atrial septum. IMPRESSION: 1. 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 CT FFR will be performed and reported separately. 2. The patient's coronary artery calcium score is 1371, which places the patient in the 99th percentile. Motion artifact from arrhythmia impacts the quantitation of coronary calcium which maybe overestimated. 3. Normal coronary origin with right dominance. 4. Poor contrast opacification of the tip of the left atrial appendage. Cannot exclude LA appendage thrombus vs incomplete contrast filling. Electronically Signed   By: Cherlynn Kaiser   On: 03/10/2020 15:03   Result Date: 03/10/2020 EXAM: OVER-READ INTERPRETATION  CT CHEST The following report is an over-read performed by radiologist Dr. Rolm Baptise of [facility], [place] on 03/10/2020. This over-read does not include interpretation of cardiac or coronary anatomy or pathology. The coronary CTA interpretation by the cardiologist is attached. COMPARISON:  None. FINDINGS: Vascular: Heart is mildly enlarged. Visualized aorta normal caliber. Mediastinum/Nodes: No adenopathy in the lower mediastinum or hila. Lungs/Pleura: No confluent opacities or effusions. Upper Abdomen: Imaging into the upper abdomen shows no acute findings. Musculoskeletal: Chest wall soft tissues are unremarkable. No acute bony abnormality. IMPRESSION: Mild cardiomegaly. No acute cardiopulmonary disease. Electronically Signed: By: Rolm Baptise M.D. On: 03/10/2020 12:19   CT CORONARY FRACTIONAL FLOW RESERVE FLUID ANALYSIS  Result  Date: 03/10/2020 EXAM: CT FFR ANALYSIS CLINICAL DATA:  Chest pain, cardiac cause suspected FINDINGS: FFRct analysis was performed on the original cardiac CT angiogram dataset. Diagrammatic representation of the FFRct analysis is provided in a separate PDF document in PACS. This dictation was created using the PDF document and an interactive 3D model of the results. 3D model is not available in the EMR/PACS. Normal FFR range is >0.80, grey zone (indeterminate) 0.76-0.80. 1. Left Main:  No significant stenosis. FFR = 0.95 2. LAD: No significant stenosis. Proximal FFR = 0.87, Mid FFR = 0.84, Distal FFR = 0.74 3. LCX: No significant stenosis. Proximal FFR = 0.91, Distal FFR = could not be mapped 4. RCA: No significant stenosis. Proximal FFR = 0.91, Mid FFR = 0.86, Distal FFR = 0.79. PLA FFR = 0.73 IMPRESSION: 1. CT FFR analysis demonstrates hemodynamically significant stenosis in the ostial posterolateral branch, as well as distal LAD. RECOMMENDATIONS: Aggressive goal directed medical therapy for secondary prevention of CAD. Distal LAD and PLA are small in caliber, approximately 2 mm in diameter. Electronically Signed   By: Cherlynn Kaiser   On: 03/10/2020 22:18    Cardiac Studies   Coronary CTA 03/10/2020  IMPRESSION: 1. 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 CT FFR will be performed and reported separately.  2. The patient's coronary artery calcium score is 1371, which places the patient in the 99th percentile. Motion artifact from arrhythmia impacts the quantitation of coronary calcium which maybe overestimated.  3. Normal coronary origin with right dominance.  4. Poor contrast opacification of the tip of the left atrial appendage. Cannot exclude LA appendage thrombus vs incomplete contrast filling.  Echocardiogram 03/08/2020 IMPRESSIONS  1. Left ventricular ejection fraction, by estimation, is 60 to 65%. Left    ventricular ejection fraction by PLAX is 63 %. The left ventricle has  normal function. The left ventricle  has no regional wall motion  abnormalities. Left ventricular diastolic  parameters are indeterminate.  2. Right ventricular systolic function is normal. The right ventricular  size is normal. There is moderately elevated pulmonary artery systolic  pressure.  3. Left atrial size was mildly dilated.  4. The mitral valve is normal in structure. Mild mitral valve  regurgitation. No evidence of mitral stenosis.  5. Tricuspid valve regurgitation is mild to moderate.  6. The aortic valve is tricuspid. Aortic valve regurgitation is not  visualized. No aortic stenosis is present.  7. The inferior vena cava is dilated in size with >50% respiratory  variability, suggesting right atrial pressure of 8 mmHg.   Patient Profile     69 y.o. female with a hx of hypertension, diabetes type 1, hyperlipidemia, thyroid cancer status post thyroidectomy, OSA on BiPAP therapy, carotid artery disease, CVAin 216 and 2019, GERD, paroxysmal atrial fibrillationon Eliquiswho is being seen for the evaluation of syncope.  Assessment & Plan    Syncope -Patient hadmechanical fall followed by?syncopal episode after sitting down on her walker with prodrome dizziness, lightheadedness, and feeling hot. No chest pain or palpitations. LOC 1 minuteper her sister but she said that she was aware of her surroundings the entire time and just could not talk. Did not hit her head.  -Question of possible vagal episode after tripping.  In ED she was hypertensive with SBP in the 170's.  -head CT negative for acute process. -EKG with Sinus bradycardia, 43 bpm, and frequent non-conducted PACs. Beta blocker was stopped.   - Echo showed EF 60-65%, no WMA, mild MR, mild to mod TR, normal RV function, RA pressure 26mmHg - COVID negative - TSH 2.9 -ILR interrogation shows frequent PACs but unfortunately not turned on to  detect bradyarrhythmias - reprogrammed to detect brady now. -No obstructive CAD on Cardiac CTA.  -Unclear if bradycardia led to her syncope or a noncardiac reason.  Bradycardia/frequent nonconducted PACs -Beta-blocker was discontinued. -She is now in sinus rhythm in the 60s with PACs but no longer non-conducted or any pauses. -We will await recommendations from EP.  She will likely be restarted on her beta-blocker with addition of antiarrhythmic therapy such as Multaq.  Paroxysmal atrial fibrillation -Pt maintaining SR with PACs.  Awaiting EP recommendations. -Continues on Eliquis for anticoagulation for stroke risk reduction with CHA2DS2-VASc score of 5 (female, age, CVA, DM). -Patient has ILR since 2019.  Per EP: "AF burden has been minimal of late, much better since Aug 2020, overall burden 7.0% (since Jan 2020) I also note that tracings printed of AF episodes are false and 2/2 PACs and blocked PACs"  CAD -Patient had complaints of intermittent chest tightness for the past few weeks. No chest discomfort today. -Coronary CTA shows mostly distal disease, calcium score 1371.  She does not currently need revascularization, however her coronary disease likely makes use of flecainide not advised.  Chronic diastolic CHF -EF preserved -Patient managed with as needed torsemide at home.  She appears euvolemic here.  DM type 1 -A1C 8.4 -Continues on Insulin pump. Management per IM  Hypothyroidism  -Continues on synthroid -TSH 2.978  Hx of cryptogenic stroke -Head CT negative for acute process in work up for syncope  HLD - continue atorvastatin 80mg   PH  - Related to obesity  Hypomagnesemia -Mag was low at 1.5, given Mag sulfate 2 gms, follow up mag level  1.7 -will give another dose of 2gm IV mag today -repeat Mag level in am  For questions or updates, please contact [place] Please consult [email or web address] for contact info under        Signed, Daune Perch, NP  03/11/2020, 8:50 AM

## 2020-03-11 NOTE — Progress Notes (Signed)
Family Medicine Teaching Service Daily Progress Note Intern Pager: [phone]  Patient name: [name] Medical record number: [id number] Date of birth: 16-Jul-1951 Age: 69 y.o. Gender: female  Primary Care Provider: Shirley, [name], DO Consultants: Cardiology Code Status: FULL   Pt Overview and Major Events to Date:  3/14: Admitted  Assessment and Plan:  Symptomatic bradycardia-unclear etiology Doing well this morning. Ambulated with PT without dizziness HR: 60-70s Coronary CTA: Severe plaque distal RCA and mod CAD proximal and mid LAD, CADRADS = 4. CT FFR pending. Coronary art calcium score is 1371, 99th %ile Motion artifact from arrhythmia may overestimate coronary calcium. Cannot exclude LA appendage thrombus vs incomplete contrast filling -Continuous telemetry  and pulse oximetry  -Vitals per floor routine -Cardiology following, appreciate recs. Pending EP recs. -PT/OT: recommended SNF, patient is happy to go to SNF after discussion today.  Not want to go to [facility] as was the prior and did not like it.  Hypokalemia   K 3.7 today  -Monitor with daily BMP  Hypomagnesemia Mg 2.1 -Repleted today per cardiology  -Daily Mg, replete as necessary  H/o HFpEF Pt has reporting peripheral edema worsening. On exam: bilateral pitting edema of LE and hemosiderin deposits.. Echo: Left ejection fraction 60 to 65% -Home meds include torsemide 10 mg as needed for swelling -Consider restarting Torsemide per Cardiology  H/o CVA with residual left sided weakness  CT head: no acute abnormalities  -Neurological status  Hypothyroidism Last TSH in 2019-2.47 Home meds: Levothyroxine 175 mcg once daily except Sundays -TSH today, consider T3-T4 if necessary -Continue Levothyroxine  T1DM CBGs 178, 238. Last A1c 8.4 on admission.  Sees Dr. Debbora Presto for endocrinology. Patient endorses peripheral neuropathy. Could also have autonomic reflex issues contributing to fall. Home  meds: Insulin aspart via patient's insulin pump -Continue insulin pump - monitor CBGs  Normocytic anemia  leukopenia Hgb 10.9, WBC 4.4 today -Montitor with CBC  Afib HR 50-60s Home meds include: Eliquis 5 mg BID, metoprolol 100 mg XL once daily -Hold metoprolol -Continue Eliquis    HTN Bps have been persistently elevated. [id number],  DBP 56-63 Home meds: Metoprolol 100 mg once daily -Hold metoprolol due to bradycardia -Cardiology following, appreciate recommendations increased Losartan 100mg  on 3/16 -Monitor Bps  GERD Home meds include Protonix 40 mg -Continue Protonix  Depression- home meds: fluoxetine 30mg  daily -Continue fluoxetine  Hypercholesterolemia Home meds include atorvastatin 80 mg -Continue statin  Elevated transaminases-improved AST 68>38, ALT 54>39  -CMP a.m.  Chronic Back Pain Home meds include: Gabapentin 300mg  qHS -Continue gabapentin 300 mg  FEN/GI: Heart healthy, Protonix Prophylaxis: Lovenox   Disposition: IP for next 1-2 days   Subjective:  Feels some chest tightness. Thinks shes needs more Torsemide. She is happy to go to SNF for a short period.   Objective: Temp:  [97.9 F (36.6 C)-99.3 F (37.4 C)] 98.9 F (37.2 C) (03/18 0500) Pulse Rate:  [57-63] 57 (03/18 0500) Resp:  [18-20] 20 (03/18 0500) BP: (148-181)/(55-73) 160/65 (03/18 0500) SpO2:  [98 %-100 %] 100 % (03/18 0500) Weight:  [103.9 kg] 103.9 kg (03/18 0500)   Physical Exam:  General: Alert, pleasant, no acute distress Cardio: Normal S1 and S2, RRR, No murmurs or rubs.   Pulm: CTAB, normal WOB Abdomen: Bowel sounds normal. Abdomen soft and non-tender.  Extremities: No peripheral edema. Warm/ well perfused.  Strong radial pulse. Neuro: Cranial nerves grossly intact  Laboratory: Recent Labs  Lab 03/07/20 1505 03/11/20 0417  WBC 3.3* 4.4  HGB 10.8*  10.9*  HCT 34.7* 34.2*  PLT 168 178   Recent Labs  Lab 03/07/20 1505 03/07/20 1505 03/08/20 0338  03/08/20 0338 03/09/20 0801 03/10/20 0431 03/11/20 0417  NA 141   < > 144   < > 140 139 139  K 3.5   < > 3.8   < > 3.9 3.7 3.7  CL 110   < > 110   < > 107 102 104  CO2 26   < > 21*   < > 24 25 26   BUN 12   < > 11   < > 11 9 9   CREATININE 0.99   < > 0.91   < > 1.05* 0.88 0.97  CALCIUM 8.6*   < > 8.6*   < > 8.6* 8.6* 8.5*  PROT 6.1*  --  5.4*  --   --   --   --   BILITOT 0.5  --  0.9  --   --   --   --   ALKPHOS 107  --  98  --   --   --   --   ALT 54*  --  39  --   --   --   --   AST 68*  --  38  --   --   --   --   GLUCOSE 189*   < > 148*   < > 215* 205* 217*   < > = values in this interval not displayed.     Imaging/Diagnostic Tests:   Lattie Haw, MD 03/11/2020, 9:50 AM PGY-1, [place] Intern pager: [phone], text pages welcome

## 2020-03-11 NOTE — NC FL2 (Signed)
[place] LEVEL OF CARE SCREENING TOOL     IDENTIFICATION  Patient Name: [name] Birthdate: 07/17/1951 Sex: female Admission Date (Current Location): 03/07/2020  [facility] and [place] Number:  Herbalist and Address:  [facility], [place] [address], [place], [place] [address]      Provider Number: [id number]  Attending Physician Name and Address:  Leeanne Rio, MD  Relative Name and Phone Number:  [name] [phone]    Current Level of Care: Hospital Recommended Level of Care: [place] Prior Approval Number:    Date Approved/Denied:   PASRR Number: Pending  Discharge Plan: SNF    Current Diagnoses: Patient Active Problem List   Diagnosis Date Noted  . Chest tightness   . Near syncope 03/08/2020  . Chronic anticoagulation 03/08/2020  . Hemiparesis and numbness affecting left side as late effect of cerebrovascular accident (CVA) ([place]) 03/08/2020  . History of Falls with injury 03/08/2020  . Hypoalbuminemia 03/08/2020  . Proteinuria 03/08/2020  . Heart block atrioventricular 03/08/2020  . Bradycardia 03/07/2020  . Dependent for wheelchair mobility 08/05/2019  . OSA (obstructive sleep apnea) 05/27/2019  . Paroxysmal atrial fibrillation ([place]) 05/27/2019  . Insulin pump in place   . (HFpEF) heart failure with preserved ejection fraction ([place]) 03/17/2019  . 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 Hypertension associated with diabetes ([place]) 10/29/2015  . 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 ([place])   . Lower extremity edema 08/20/2013  . Morbid obesity ([place]) 05/29/2013  . 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 ([place]) [id number]  . Hypothyroidism 01/25/2012  . GERD (gastroesophageal reflux disease) 01/25/2012    Orientation RESPIRATION BLADDER Height & Weight     Self, Time, Situation, Place  Normal Continent, External catheter  Weight: 229 lb (103.9 kg) Height:  5\' 7"  (170.2 cm)  BEHAVIORAL SYMPTOMS/MOOD NEUROLOGICAL BOWEL NUTRITION STATUS      Continent Diet(See discharge summary)  AMBULATORY STATUS COMMUNICATION OF NEEDS Skin   Extensive Assist Verbally Normal                       Personal Care Assistance Level of Assistance  Bathing, Dressing, Feeding Bathing Assistance: Maximum assistance Feeding assistance: Limited assistance Dressing Assistance: Maximum assistance     Functional Limitations Info  Sight, Hearing, Speech Sight Info: Adequate Hearing Info: Adequate Speech Info: Adequate    SPECIAL CARE FACTORS FREQUENCY  PT (By licensed PT), OT (By licensed OT)     PT Frequency: 5x a week OT Frequency: 5x a week            Contractures Contractures Info: Not present    Additional Factors Info  Code Status, Allergies Code Status Info: Full Allergies Info: Latex           Current Medications (03/11/2020):  This is the current hospital active medication list Current Facility-Administered Medications  Medication Dose Route Frequency Provider Last Rate Last Admin  . acetaminophen (TYLENOL) tablet 650 mg  650 mg Oral Q6H PRN Ouida Sills, Chelsey L, DO   650 mg at 03/11/20 0007   Or  . acetaminophen (TYLENOL) suppository 650 mg  650 mg Rectal Q6H PRN Ouida Sills, Chelsey L, DO      . ALPRAZolam Duanne Moron) tablet 0.25 mg  0.25 mg Oral Once Anderson, Chelsey L, DO      . amLODipine (NORVASC) tablet 5 mg  5 mg Oral Daily Sueanne Margarita, MD  5 mg at 03/11/20 1013  . apixaban (ELIQUIS) tablet 5 mg  5 mg Oral BID Anderson, Chelsey L, DO   5 mg at 03/11/20 1014  . atorvastatin (LIPITOR) tablet 80 mg  80 mg Oral q1800 McDiarmid, Blane Ohara, MD   80 mg at 03/10/20 1638  . FLUoxetine (PROZAC) capsule 30 mg  30 mg Oral Daily Anderson, Chelsey L, DO   30 mg at 03/11/20 1014  . gabapentin (NEURONTIN) tablet 300 mg  300 mg Oral QHS Lattie Haw, MD   300 mg at 03/10/20 2248  . insulin pump   Subcutaneous TID  WC, HS, 0200 Lockamy, Timothy, DO   Given by Other at 03/11/20 0955  . levothyroxine (SYNTHROID) tablet 175 mcg  175 mcg Oral Q0600 Doristine Mango L, DO   175 mcg at 03/11/20 [id number]  . loratadine (CLARITIN) tablet 10 mg  10 mg Oral Daily PRN McDiarmid, Blane Ohara, MD   10 mg at 03/10/20 1323  . losartan (COZAAR) tablet 100 mg  100 mg Oral Daily Fransico Him R, MD   100 mg at 03/11/20 1015  . metoprolol succinate (TOPROL-XL) 24 hr tablet 50 mg  50 mg Oral Daily Baldwin [place], PA-C   50 mg at 03/11/20 1015  . pantoprazole (PROTONIX) EC tablet 40 mg  40 mg Oral BID AC Lattie Haw, MD   40 mg at 03/11/20 0639  . polyethylene glycol (MIRALAX / GLYCOLAX) packet 17 g  17 g Oral Daily Lattie Haw, MD   17 g at 03/11/20 1018     Discharge Medications: Please see discharge summary for a list of discharge medications.  Relevant Imaging Results:  Relevant Lab Results:   Additional Information Insulin pump 3 times daily with meals, bedtime and 0200       SSN [id number]  Neysa Hotter Riddick, LCSW

## 2020-03-11 NOTE — Progress Notes (Signed)
Physical Therapy Treatment Patient Details Name: [name] MRN: [id number] DOB: [birth year] Today's Date: 03/11/2020    History of Present Illness Pt is a 69 y.o. female presenting with fall and near syncope episode. PMHx is significant for type 1 diabetes, A. fib, history of CVAs, hypothyroidism, hypertension, hyperlipidemia, history of HFpEF, GERD, depression/anxiety, chronic back pain.    PT Comments    Pt is making progress with mobility was able to tolerate more today with decreased assistance. Completed seated exercises, noted pt has difficulty with coordination with alternating rhythms. Was able to stand from recliner with min a and cues, able to ambulate approx 102 ft w/ RW x2 w/ min a. Noted some ataxia with LLE movements but gait was functional. Pt is aware of her deficits and multi falls over short period of time but would like to return home with outpatient. She has asked about having caregiver to be with her when sister is at work and possibly return to driving again. She reports she was in outpatient therapy last year prior to pandemic and is hoping to return to this again.     Follow Up Recommendations  Supervision for mobility/OOB     Equipment Recommendations  Wheelchair cushion (measurements PT);Wheelchair (measurements PT)    Recommendations for Other Services       Precautions / Restrictions Precautions Precautions: Fall Precaution Comments: Has had multiple falls; one where she broke her arm Restrictions Weight Bearing Restrictions: No    Mobility  Bed Mobility               General bed mobility comments: pt sitting in recliner at therapist arrival  Transfers Overall transfer level: Needs assistance Equipment used: Rolling walker (2 wheeled) Transfers: Sit to/from [employer] Transfers Sit to Stand: Supervision;Min guard Stand pivot transfers: Supervision;Min guard       General transfer comment: increased time and multi  attempts to get up but did well with this  Ambulation/Gait Ambulation/Gait assistance: Min assist Gait Distance (Feet): 104 Feet Assistive device: Rolling walker (2 wheeled) Gait Pattern/deviations: Step-through pattern;Ataxic(decreasedd control over LLE with mobility) Gait velocity: dec       Stairs             Wheelchair Mobility    Modified Rankin (Stroke Patients Only)       Balance Overall balance assessment: Needs assistance Sitting-balance support: Feet supported Sitting balance-Leahy Scale: Good     Standing balance support: During functional activity;Bilateral upper extremity supported Standing balance-Leahy Scale: Poor Standing balance comment: reliant on UE support/external assist                            Cognition Arousal/Alertness: Awake/alert Behavior During Therapy: WFL for tasks assessed/performed Overall Cognitive Status: No family/caregiver present to determine baseline cognitive functioning                                        Exercises General Exercises - Lower Extremity Ankle Circles/Pumps: AROM;Strengthening;Both;20 reps Long Arc Quad: AROM;Strengthening;Both;20 reps Hip Flexion/Marching: AROM;Strengthening;Both;20 reps    General Comments        Pertinent Vitals/Pain Pain Assessment: No/denies pain    Home Living                      Prior Function  PT Goals (current goals can now be found in the care plan section) Acute Rehab PT Goals Patient Stated Goal: ready to get better would like to go home but with outpatient PT Goal Formulation: With patient Time For Goal Achievement: 03/24/20 Potential to Achieve Goals: Fair Progress towards PT goals: Progressing toward goals    Frequency    Min 3X/week      PT Plan Discharge plan needs to be updated    Co-evaluation              AM-PAC PT "6 Clicks" Mobility   Outcome Measure  Help needed turning from your  back to your side while in a flat bed without using bedrails?: A Little Help needed moving from lying on your back to sitting on the side of a flat bed without using bedrails?: A Little Help needed moving to and from a bed to a chair (including a wheelchair)?: A Little Help needed standing up from a chair using your arms (e.g., wheelchair or bedside chair)?: A Little Help needed to walk in hospital room?: A Little Help needed climbing 3-5 steps with a railing? : A Lot 6 Click Score: 17    End of Session Equipment Utilized During Treatment: Gait belt Activity Tolerance: Patient limited by fatigue;Patient tolerated treatment well Patient left: in chair;with call bell/phone within reach Nurse Communication: Mobility status PT Visit Diagnosis: Unsteadiness on feet (R26.81);Muscle weakness (generalized) (M62.81)     Time: [id number] PT Time Calculation (min) (ACUTE ONLY): 28 min  Charges:  $Gait Training: 8-22 mins $Therapeutic Exercise: 8-22 mins                     Horald Chestnut, PT    Delford Field 03/11/2020, 3:44 PM

## 2020-03-11 NOTE — Progress Notes (Addendum)
Progress Note  Patient Name: [name] Date of Encounter: 03/11/2020  Primary Cardiologist: Candee Furbish, MD   Subjective   Feels well, no CP, palpitations, no SOB.  Says she is planned for SNF ro some functional decline and falls at home  Inpatient Medications    Scheduled Meds:  ALPRAZolam  0.25 mg Oral Once   amLODipine  5 mg Oral Daily   apixaban  5 mg Oral BID   atorvastatin  80 mg Oral q1800   FLUoxetine  30 mg Oral Daily   gabapentin  300 mg Oral QHS   insulin pump   Subcutaneous TID WC, HS, 0200   levothyroxine  175 mcg Oral Q0600   losartan  100 mg Oral Daily   pantoprazole  40 mg Oral BID AC   polyethylene glycol  17 g Oral Daily   torsemide  10 mg Oral Daily   Continuous Infusions:   PRN Meds: acetaminophen **OR** acetaminophen, loratadine   Vital Signs    Vitals:   03/10/20 1600 03/10/20 1608 03/10/20 1951 03/11/20 0500  BP: (!) 179/68 (!) 172/73 (!) 160/55 (!) 160/65  Pulse:   61 (!) 57  Resp: 20  20 20   Temp:   99.3 F (37.4 C) 98.9 F (37.2 C)  TempSrc:   Axillary Oral  SpO2:   98% 100%  Weight:    103.9 kg  Height:        Intake/Output Summary (Last 24 hours) at 03/11/2020 0857 Last data filed at 03/11/2020 0805 Gross per 24 hour  Intake 120 ml  Output 1350 ml  Net -1230 ml   Last 3 Weights 03/11/2020 03/10/2020 03/09/2020  Weight (lbs) 229 lb 202 lb 11.2 oz 238 lb 12.1 oz  Weight (kg) 103.874 kg 91.944 kg 108.3 kg      Telemetry    SR, 60's-70's, PACs, more conducted in the last 24hours, PACs conduct with longer PR, no clear evidence of true heart block - Personally Reviewed  ECG    No new EKGs - Personally Reviewed  Physical Exam   GEN: No acute distress.   Neck: No JVD Cardiac: RRR, soft SM, rubs, or gallops.  Respiratory: CTA b/l. GI: Soft, nontender, non-distended  MS: No edema; No deformity. Neuro:  Nonfocal  Psych: Normal affect   Labs    High Sensitivity Troponin:   Recent Labs  Lab 03/07/20 1505  03/07/20 1934 03/08/20 0712 03/08/20 0842  TROPONINIHS 30* 69* 33* 34*      Chemistry Recent Labs  Lab 03/07/20 1505 03/07/20 1505 03/08/20 0338 03/08/20 0338 03/09/20 0801 03/10/20 0431 03/11/20 0417  NA 141   < > 144   < > 140 139 139  K 3.5   < > 3.8   < > 3.9 3.7 3.7  CL 110   < > 110   < > 107 102 104  CO2 26   < > 21*   < > 24 25 26   GLUCOSE 189*   < > 148*   < > 215* 205* 217*  BUN 12   < > 11   < > 11 9 9   CREATININE 0.99   < > 0.91   < > 1.05* 0.88 0.97  CALCIUM 8.6*   < > 8.6*   < > 8.6* 8.6* 8.5*  PROT 6.1*  --  5.4*  --   --   --   --   ALBUMIN 3.1*  --  2.7*  --   --   --   --  AST 68*  --  38  --   --   --   --   ALT 54*  --  39  --   --   --   --   ALKPHOS 107  --  98  --   --   --   --   BILITOT 0.5  --  0.9  --   --   --   --   GFRNONAA 58*   < > >60   < > 54* >60 60*  GFRAA >60   < > >60   < > >60 >60 >60  ANIONGAP 5   < > 13   < > 9 12 9    < > = values in this interval not displayed.     Hematology Recent Labs  Lab 03/07/20 1505 03/11/20 0417  WBC 3.3* 4.4  RBC 3.77* 3.77*  HGB 10.8* 10.9*  HCT 34.7* 34.2*  MCV 92.0 90.7  MCH 28.6 28.9  MCHC 31.1 31.9  RDW 12.8 12.8  PLT 168 178    BNPNo results for input(s): BNP, PROBNP in the last 168 hours.   DDimer No results for input(s): DDIMER in the last 168 hours.   Radiology     CT CORONARY MORPH W/CTA COR W/SCORE W/CA W/CM &/OR WO/CM Addendum Date: 03/10/2020   ADDENDUM REPORT: 03/10/2020 15:03 HISTORY: Chest pain, atrial fibrillation, frequent PACs EXAM: Cardiac/Coronary  CT TECHNIQUE: The patient was scanned on a [employer]. PROTOCOL: A 120 kV prospective scan was triggered in the descending thoracic aorta at 111 HU's. Axial non-contrast 3 mm slices were carried out through the heart. The data set was analyzed on a dedicated work station and scored using the [place]. Gantry rotation speed was 250 msecs and collimation was .6 mm. Beta blockade and 0.8 mg of sl NTG was  given. The 3D data set was reconstructed in 5% intervals of the 67-82 % of the R-R cycle. Diastolic phases were analyzed on a dedicated work station using MPR, MIP and VRT modes. The patient received 44mL OMNIPAQUE IOHEXOL 350 MG/ML SOLN of contrast on initial scan, and then was rebolused due to motion artifact and rescanned with retrospective ECG gating and repeat nitroglycerin dosing. FINDINGS: Arrhythmia contributes to motion artifact that degrades the diagnostic accuracy of this exam. Coronary calcium score: The patient's coronary artery calcium score is 1371, which places the patient in the 99th percentile. Motion artifact from arrhythmia impacts the quantitation of coronary calcium which maybe overestimated. Coronary arteries: Normal coronary origins.  Right dominance. Right Coronary Artery: Mild ostial mixed atherosclerotic plaque, 25-49% stenosis. Tubular, long segment mixed atherosclerotic plaque in the proximal to mid RCA with possible moderate stenosis, 50-69% stenosis. Mid vessel is obscured by motion artifact, cannot accurately determine luminal stenosis. Mild mixed atherosclerotic plaque in the mid-distal RCA. Moderate atherosclerotic plaque in the distal RCA (50-69% stenosis) with a possible focal atherosclerotic plaque with severe stenosis in the distal RCA, 70-99% stenosis. PDA and PL appear patient. There is ostial PL mixed atherosclerotic plaque, luminal assessment challenging due to image resolution but appears moderate (50-69% stenosis). Left Main Coronary Artery: Ostial mixed atherosclerotic plaque with minimal stenosis, <25%. Distal mixed atherosclerotic plaque with mild stenosis, 25-49% stenosis. Left Anterior Descending Coronary Artery: Moderate mixed atherosclerotic plaque in the proximal LAD, 50-69% stenosis. Moderate mid LAD mixed atherosclerotic plaque, 50-69%. Left Circumflex Artery: Mild atherosclerotic plaque in the proximal L circumflex artery, 25-49% stenosis. Distal vessel appears  patent and small caliber. Aorta: Normal  size, 29 mm at the mid ascending aorta (level of the PA bifurcation) measured double oblique. No calcifications. No dissection. Aortic Valve: No calcifications.  Mild annular calcifications. Other findings: Normal pulmonary vein drainage into the left atrium. Normal left atrial appendage without a thrombus. Mild dilation of main pulmonary artery, 31 mm. Lipomatous atrial septum. IMPRESSION: 1. 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 CT FFR Priscilla Finklea be performed and reported separately. 2. The patient's coronary artery calcium score is 1371, which places the patient in the 99th percentile. Motion artifact from arrhythmia impacts the quantitation of coronary calcium which maybe overestimated. 3. Normal coronary origin with right dominance. 4. Poor contrast opacification of the tip of the left atrial appendage. Cannot exclude LA appendage thrombus vs incomplete contrast filling. Electronically Signed   By: Cherlynn Kaiser   On: 03/10/2020 15:03   Result Date: 03/10/2020 EXAM: OVER-READ INTERPRETATION  CT CHEST The following report is an over-read performed by radiologist Dr. Rolm Baptise of [facility], [place] on 03/10/2020. This over-read does not include interpretation of cardiac or coronary anatomy or pathology. The coronary CTA interpretation by the cardiologist is attached. COMPARISON:  None. FINDINGS: Vascular: Heart is mildly enlarged. Visualized aorta normal caliber. Mediastinum/Nodes: No adenopathy in the lower mediastinum or hila. Lungs/Pleura: No confluent opacities or effusions. Upper Abdomen: Imaging into the upper abdomen shows no acute findings. Musculoskeletal: Chest wall soft tissues are unremarkable. No acute bony abnormality. IMPRESSION: Mild cardiomegaly. No acute cardiopulmonary disease. Electronically Signed: By: Rolm Baptise M.D. On: 03/10/2020 12:19   CT CORONARY FRACTIONAL FLOW RESERVE  FLUID ANALYSIS Result Date: 03/10/2020 EXAM: CT FFR ANALYSIS CLINICAL DATA:  Chest pain, cardiac cause suspected FINDINGS: FFRct analysis was performed on the original cardiac CT angiogram dataset. Diagrammatic representation of the FFRct analysis is provided in a separate PDF document in PACS. This dictation was created using the PDF document and an interactive 3D model of the results. 3D model is not available in the EMR/PACS. Normal FFR range is >0.80, grey zone (indeterminate) 0.76-0.80. 1. Left Main:  No significant stenosis. FFR = 0.95 2. LAD: No significant stenosis. Proximal FFR = 0.87, Mid FFR = 0.84, Distal FFR = 0.74 3. LCX: No significant stenosis. Proximal FFR = 0.91, Distal FFR = could not be mapped 4. RCA: No significant stenosis. Proximal FFR = 0.91, Mid FFR = 0.86, Distal FFR = 0.79. PLA FFR = 0.73 IMPRESSION: 1. CT FFR analysis demonstrates hemodynamically significant stenosis in the ostial posterolateral branch, as well as distal LAD. RECOMMENDATIONS: Aggressive goal directed medical therapy for secondary prevention of CAD. Distal LAD and PLA are small in caliber, approximately 2 mm in diameter. Electronically Signed   By: Cherlynn Kaiser   On: 03/10/2020 22:18    Cardiac Studies   Echocardiogram 03/08/2020 IMPRESSIONS   1. Left ventricular ejection fraction, by estimation, is 60 to 65%. Left  ventricular ejection fraction by PLAX is 63 %. The left ventricle has  normal function. The left ventricle has no regional wall motion  abnormalities. Left ventricular diastolic  parameters are indeterminate.   2. Right ventricular systolic function is normal. The right ventricular  size is normal. There is moderately elevated pulmonary artery systolic  pressure.   3. Left atrial size was mildly dilated.   4. The mitral valve is normal in structure. Mild mitral valve  regurgitation. No evidence of mitral stenosis.   5. Tricuspid valve regurgitation is mild to moderate.  6. The aortic  valve is tricuspid. Aortic valve regurgitation is not  visualized. No aortic stenosis is present.   7. The inferior vena cava is dilated in size with >50% respiratory  variability, suggesting right atrial pressure of 8 mmHg.      10/14/18: TTE Study Conclusions - Procedure narrative: Transthoracic echocardiography. Technically   difficult study. Intravenous contrast (Definity) was   administered. - Left ventricle: The cavity size was normal. There was moderate   concentric hypertrophy. Systolic function was normal. The   estimated ejection fraction was in the range of 60% to 65%. Wall   motion was normal; there were no regional wall motion   abnormalities. The study is not technically sufficient to allow   evaluation of LV diastolic function. - Left atrium: The atrium was normal in size. - Right atrium: The atrium was normal in size. Impressions: - Technically difficult study. Afib is noted. LVEF 60-65%, moderate   LVH, normal wall motion, normal biatrial size.  Patient Profile     69 y.o. female with a hx of HTN, DM (type I), HLD, thyroid cancer s/p thyroidectomy, stroke (2016 and 2019), GERD, normal coronaries by cath in 2013, HFpEF, significant atrial ectopy, and via loop found to have AFib  She was admitted to [facility] 03/08/2020 after a fall vs unclear syncope.  Observations of HR 30's-50's with frequent blocked PACs. Her home toprol 100mg  daily was held on admission    Assessment & Plan    1. Possible syncope      Initial event the patient feels was a slip/trip with her shoe, though once back up and seated became weak Perhaps vagally mediated when being helped up/bearing down to stand? She was bradycardic (30's-50's) on EMS arrival and throughout transport by EMS with no further symptoms   2. Paroxysmal AFib     CHA2DS2Vasc is 7, on Eliquis appropriately dosed      Loop recorer interrogation this admission AF burden has been minimal of late, much better since Aug 2020,  overall burden 7.0% (since Jan 2020) I also note that tracings printed of AF episodes are false and 2/2 PACs and blocked PACs    3. Bradycardia     2/2 blocked PACs     No clear evidence of any significant conduction system disease     Await he coronary/ischemic w/u to decide on management of her atrial ectopy      Keagen Heinlen resume 1/2 dose of her Toprol QT looks a little long, Cleve Paolillo discuss Multaq with Dr. Curt Bears Dr. Curt Bears Jaisha Villacres see her later today      For questions or updates, please contact [place] Please consult [email or web address] for contact info under        Signed, Baldwin [place], PA-C  03/11/2020, 8:57 AM    I have seen and examined this patient with Tommye Standard.  Agree with above, note added to reflect my findings.  On exam, RRR, no murmurs.  Patient with continued PACs.  She is currently feeling well.  No further evidence of bradycardia.  Would restart her Toprol-XL at 50 mg.  We Madex Seals continue to monitor heart rhythm through her Linq monitor.  At this point no further EP work-up is necessary.  We Yohance Hathorne potentially add antiarrhythmics if needed as an outpatient.  Visente Kirker M. Chakita Mcgraw MD 03/11/2020 12:19 PM

## 2020-03-11 NOTE — Care Management Important Message (Signed)
Important Message  Patient Details  Name: [name] MRN: [id number] Date of Birth: 1951/06/23   Medicare Important Message Given:  Yes     Shelda Altes 03/11/2020, 12:36 PM

## 2020-03-11 NOTE — Telephone Encounter (Signed)
Patient calls nurse line regarding following up with PCP. Patient is currently admitted in the hospital and was told that Dr. Enid Derry would be contacting her this week. Patient states that once discharged from the hospital she will be going to rehab facility. I informed patient that I would make PCP aware.   To PCP  Talbot Grumbling, RN

## 2020-03-12 LAB — BASIC METABOLIC PANEL
Anion gap: 11 (ref 5–15)
BUN: 11 mg/dL (ref 8–23)
CO2: 26 mmol/L (ref 22–32)
Calcium: 9 mg/dL (ref 8.9–10.3)
Chloride: 103 mmol/L (ref 98–111)
Creatinine, Ser: 1 mg/dL (ref 0.44–1.00)
GFR calc Af Amer: >60 mL/min (ref >60)
GFR calc non Af Amer: 57 mL/min — ABNORMAL LOW (ref >60)
Glucose, Bld: 215 mg/dL — ABNORMAL HIGH (ref 70–99)
Potassium: 3.5 mmol/L (ref 3.5–5.1)
Sodium: 140 mmol/L (ref 135–145)

## 2020-03-12 LAB — GLUCOSE, CAPILLARY
Glucose-Capillary: 180 mg/dL — ABNORMAL HIGH (ref 70–99)
Glucose-Capillary: 161 mg/dL — ABNORMAL HIGH (ref 70–99)
Glucose-Capillary: 256 mg/dL — ABNORMAL HIGH (ref 70–99)
Glucose-Capillary: 375 mg/dL — ABNORMAL HIGH (ref 70–99)
Glucose-Capillary: 276 mg/dL — ABNORMAL HIGH (ref 70–99)

## 2020-03-12 LAB — MAGNESIUM: Magnesium: 1.7 mg/dL (ref 1.7–2.4)

## 2020-03-12 NOTE — TOC Progression Note (Signed)
Transition of Care (TOC) - Progression Note    Patient Details  Name: [name] MRN: [id number] Date of Birth: 07/20/1951  Transition of Care (TOC) CM/SW Contact   J Riddick, LCSW Phone Number: 03/12/2020, 1:18 PM  Clinical Narrative:    CSW met bedside with patient to discuss bed offers. Patient expressed she would like to discharge to [facility]. No further questions reported at this time.   CSW contacted Sheila with [facility] and left voicemail. CSW requested covid. Insurance authorization started reference #[id number]. CSW will continue to follow and assist with discharge planning needs.    Expected Discharge Plan: Skilled Nursing Facility Barriers to Discharge: Continued Medical Work up, Insurance Authorization  Expected Discharge Plan and Services Expected Discharge Plan: Skilled Nursing Facility       Living arrangements for the past 2 months: Single Family Home                                       Social Determinants of Health (SDOH) Interventions    Readmission Risk Interventions Readmission Risk Prevention Plan 04/17/2019  Transportation Screening Complete  PCP or Specialist Appt within 3-5 Days Complete  HRI or Home Care Consult Complete  Social Work Consult for Recovery Care Planning/Counseling Complete  Palliative Care Screening Not Applicable  Medication Review (RN Care Manager) Complete  Some recent data might be hidden   

## 2020-03-12 NOTE — Plan of Care (Signed)
Pt able to transfer to chair from bed with one person assistance, education reviewed on diabetes management with return verbal understanding

## 2020-03-12 NOTE — Progress Notes (Signed)
Went by to visit with [name]. She states she is feeling much better and is looking forward to going to a SNF to try to get stronger. She is grateful for the care she has been provided in the hospital by and will follow up with me when she is discharged.   [name] Lakynn Halvorsen, DO

## 2020-03-12 NOTE — Progress Notes (Signed)
Family Medicine Teaching Service Daily Progress Note Intern Pager: [phone]  Patient name: [name] Medical record number: [id number] Date of birth: 1951-10-02 Age: 69 y.o. Gender: female  Primary Care Provider: Shirley, [name], DO Consultants: Cardiology Code Status: FULL    Pt Overview and Major Events to Date:  3/14: Admitted  Assessment and Plan: 69yo female with PMH significant for hypothyroidism, DM, HFpEF, CVA, Afib, HTN, GERD, anemia, HLD, chronic back pain presented after an episode of syncope followed by bradycardia and received workup for etiology.  Syncope, bradycardia- no repeats since admission, HR 56-70. Cardiology and EP have signed off. Continues to have PACs without pauses (blocks).  - management per cards -Continuous telemetry and pulse oximetry, loop recorder in place - EP considering adding antiarrhythmics OP -Vitals per floor routine  Hypokalemia- resolved. 3.5 today -Monitor with daily BMP  Hypomagnesemia- resolved. 1.7 today -Daily Mg, replete as necessary  CAD, HTN, Afib- not currently needing revascularization per cards. Restarting metoprolol. BP elevated 152/50 this am. Patient denies SOB, chest pain. Excited to work more with rehab and get stronger.  - management per cards - rate controlled + eliquis  - metoprolol 50mg  daily started yesterday  H/o CVA with residual left sided weakness, HLD   - PT/OT - continue statin  Hypothyroidism- 2.978 TSH -Continue Levothyroxine  T1DM- CBGs 161-234 ON Home meds: Insulin aspart via patient's insulin pump -Continue insulin pump - monitor CBGs  Normocytic anemia  leukopenia Hgb 10.9, WBC 4.4 yesterday -Montitor with CBC  GERD Home meds include Protonix 40 mg -Continue Protonix  Depression- home meds: fluoxetine 30mg  daily -Continue fluoxetine  Elevated transaminases-improved AST 68>38, ALT 54>39  -CMP a.m.  Chronic Back Pain Home meds include: Gabapentin 300mg   qHS -Continue gabapentin 300 mg  FEN/GI: Heart healthy, Protonix Prophylaxis: Lovenox   Disposition: awaiting SNF placement. Is medically stable for discharge  Subjective:  She is happy to be here. Everyone is treating her well and is ready to go to SNF to continue her work. Has no complaints today.  Objective: Temp:  [97.7 F (36.5 C)-98.1 F (36.7 C)] 97.7 F (36.5 C) (03/19 0422) Pulse Rate:  [56-68] 68 (03/19 0422) Resp:  [15-16] 15 (03/19 0422) BP: (132-163)/(50-70) 152/50 (03/19 0422) SpO2:  [94 %-99 %] 94 % (03/19 0422)   Physical Exam:  General: Alert, pleasant, no acute distress Cardio: Normal S1 and S2, RRR, No murmurs or rubs.   Pulm: CTAB, normal WOB Extremities: No peripheral edema. Warm/ well perfused.  Strong radial pulse. Neuro: alert and oriented  Laboratory: Recent Labs  Lab 03/07/20 1505 03/11/20 0417  WBC 3.3* 4.4  HGB 10.8* 10.9*  HCT 34.7* 34.2*  PLT 168 178   Recent Labs  Lab 03/07/20 1505 03/07/20 1505 03/08/20 0338 03/09/20 0801 03/10/20 0431 03/11/20 0417 03/12/20 0151  NA 141   < > 144   < > 139 139 140  K 3.5   < > 3.8   < > 3.7 3.7 3.5  CL 110   < > 110   < > 102 104 103  CO2 26   < > 21*   < > 25 26 26   BUN 12   < > 11   < > 9 9 11   CREATININE 0.99   < > 0.91   < > 0.88 0.97 1.00  CALCIUM 8.6*   < > 8.6*   < > 8.6* 8.5* 9.0  PROT 6.1*  --  5.4*  --   --   --   --  BILITOT 0.5  --  0.9  --   --   --   --   ALKPHOS 107  --  98  --   --   --   --   ALT 54*  --  39  --   --   --   --   AST 68*  --  38  --   --   --   --   GLUCOSE 189*   < > 148*   < > 205* 217* 215*   < > = values in this interval not displayed.     Imaging/Diagnostic Tests: CT CORONARY MORPH W/CTA COR W/SCORE W/CA W/CM &/OR WO/CM  Addendum Date: 03/10/2020   ADDENDUM REPORT: 03/10/2020 15:03 HISTORY: Chest pain, atrial fibrillation, frequent PACs EXAM: Cardiac/Coronary  CT TECHNIQUE: The patient was scanned on a [employer]. PROTOCOL: A 120 kV  prospective scan was triggered in the descending thoracic aorta at 111 HU's. Axial non-contrast 3 mm slices were carried out through the heart. The data set was analyzed on a dedicated work station and scored using the [place]. Gantry rotation speed was 250 msecs and collimation was .6 mm. Beta blockade and 0.8 mg of sl NTG was given. The 3D data set was reconstructed in 5% intervals of the 67-82 % of the R-R cycle. Diastolic phases were analyzed on a dedicated work station using MPR, MIP and VRT modes. The patient received 62mL OMNIPAQUE IOHEXOL 350 MG/ML SOLN of contrast on initial scan, and then was rebolused due to motion artifact and rescanned with retrospective ECG gating and repeat nitroglycerin dosing. FINDINGS: Arrhythmia contributes to motion artifact that degrades the diagnostic accuracy of this exam. Coronary calcium score: The patient's coronary artery calcium score is 1371, which places the patient in the 99th percentile. Motion artifact from arrhythmia impacts the quantitation of coronary calcium which maybe overestimated. Coronary arteries: Normal coronary origins.  Right dominance. Right Coronary Artery: Mild ostial mixed atherosclerotic plaque, 25-49% stenosis. Tubular, long segment mixed atherosclerotic plaque in the proximal to mid RCA with possible moderate stenosis, 50-69% stenosis. Mid vessel is obscured by motion artifact, cannot accurately determine luminal stenosis. Mild mixed atherosclerotic plaque in the mid-distal RCA. Moderate atherosclerotic plaque in the distal RCA (50-69% stenosis) with a possible focal atherosclerotic plaque with severe stenosis in the distal RCA, 70-99% stenosis. PDA and PL appear patient. There is ostial PL mixed atherosclerotic plaque, luminal assessment challenging due to image resolution but appears moderate (50-69% stenosis). Left Main Coronary Artery: Ostial mixed atherosclerotic plaque with minimal stenosis, <25%. Distal mixed atherosclerotic plaque  with mild stenosis, 25-49% stenosis. Left Anterior Descending Coronary Artery: Moderate mixed atherosclerotic plaque in the proximal LAD, 50-69% stenosis. Moderate mid LAD mixed atherosclerotic plaque, 50-69%. Left Circumflex Artery: Mild atherosclerotic plaque in the proximal L circumflex artery, 25-49% stenosis. Distal vessel appears patent and small caliber. Aorta: Normal size, 29 mm at the mid ascending aorta (level of the PA bifurcation) measured double oblique. No calcifications. No dissection. Aortic Valve: No calcifications.  Mild annular calcifications. Other findings: Normal pulmonary vein drainage into the left atrium. Normal left atrial appendage without a thrombus. Mild dilation of main pulmonary artery, 31 mm. Lipomatous atrial septum. IMPRESSION: 1. 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 CT FFR will be performed and reported separately. 2. The patient's coronary artery calcium score is 1371, which places the patient in the 99th percentile. Motion artifact from arrhythmia  impacts the quantitation of coronary calcium which maybe overestimated. 3. Normal coronary origin with right dominance. 4. Poor contrast opacification of the tip of the left atrial appendage. Cannot exclude LA appendage thrombus vs incomplete contrast filling. Electronically Signed   By: Cherlynn Kaiser   On: 03/10/2020 15:03   Result Date: 03/10/2020 EXAM: OVER-READ INTERPRETATION  CT CHEST The following report is an over-read performed by radiologist Dr. Rolm Baptise of [facility], [place] on 03/10/2020. This over-read does not include interpretation of cardiac or coronary anatomy or pathology. The coronary CTA interpretation by the cardiologist is attached. COMPARISON:  None. FINDINGS: Vascular: Heart is mildly enlarged. Visualized aorta normal caliber. Mediastinum/Nodes: No adenopathy in the lower mediastinum or hila. Lungs/Pleura: No confluent opacities or  effusions. Upper Abdomen: Imaging into the upper abdomen shows no acute findings. Musculoskeletal: Chest wall soft tissues are unremarkable. No acute bony abnormality. IMPRESSION: Mild cardiomegaly. No acute cardiopulmonary disease. Electronically Signed: By: Rolm Baptise M.D. On: 03/10/2020 12:19   CT CORONARY FRACTIONAL FLOW RESERVE FLUID ANALYSIS  Result Date: 03/10/2020 EXAM: CT FFR ANALYSIS CLINICAL DATA:  Chest pain, cardiac cause suspected FINDINGS: FFRct analysis was performed on the original cardiac CT angiogram dataset. Diagrammatic representation of the FFRct analysis is provided in a separate PDF document in PACS. This dictation was created using the PDF document and an interactive 3D model of the results. 3D model is not available in the EMR/PACS. Normal FFR range is >0.80, grey zone (indeterminate) 0.76-0.80. 1. Left Main:  No significant stenosis. FFR = 0.95 2. LAD: No significant stenosis. Proximal FFR = 0.87, Mid FFR = 0.84, Distal FFR = 0.74 3. LCX: No significant stenosis. Proximal FFR = 0.91, Distal FFR = could not be mapped 4. RCA: No significant stenosis. Proximal FFR = 0.91, Mid FFR = 0.86, Distal FFR = 0.79. PLA FFR = 0.73 IMPRESSION: 1. CT FFR analysis demonstrates hemodynamically significant stenosis in the ostial posterolateral branch, as well as distal LAD. RECOMMENDATIONS: Aggressive goal directed medical therapy for secondary prevention of CAD. Distal LAD and PLA are small in caliber, approximately 2 mm in diameter. Electronically Signed   By: Cherlynn Kaiser   On: 03/10/2020 22:18     Richarda Osmond, DO 03/12/2020, 8:01 AM PGY-2, [place] Intern pager: [phone], text pages welcome

## 2020-03-12 NOTE — Social Work (Signed)
CSW confirmed [facility] bed availability. Insurance currently pending.  CSW informed by admission liaison that she will not be available for admissions on Saturday, but they can resume on Sunday. CSW updated nurse and patient, requested new covid be completed Saturday evening. TOC team will continue to follow and assist with discharge planning needs.  Criss Alvine, CSW

## 2020-03-12 NOTE — Progress Notes (Signed)
Patient refuses to wear at night.

## 2020-03-13 LAB — CBC
HCT: 37.1 % (ref 36.0–46.0)
Hemoglobin: 11.8 g/dL — ABNORMAL LOW (ref 12.0–15.0)
MCH: 29 pg (ref 26.0–34.0)
MCHC: 31.8 g/dL (ref 30.0–36.0)
MCV: 91.2 fL (ref 80.0–100.0)
Platelets: 198 10*3/uL (ref 150–400)
RBC: 4.07 MIL/uL (ref 3.87–5.11)
RDW: 12.7 % (ref 11.5–15.5)
WBC: 4.2 10*3/uL (ref 4.0–10.5)
nRBC: 0 % (ref 0.0–0.2)

## 2020-03-13 LAB — BASIC METABOLIC PANEL
Anion gap: 10 (ref 5–15)
BUN: 20 mg/dL (ref 8–23)
CO2: 26 mmol/L (ref 22–32)
Calcium: 8.8 mg/dL — ABNORMAL LOW (ref 8.9–10.3)
Chloride: 102 mmol/L (ref 98–111)
Creatinine, Ser: 1.17 mg/dL — ABNORMAL HIGH (ref 0.44–1.00)
GFR calc Af Amer: 55 mL/min — ABNORMAL LOW (ref >60)
GFR calc non Af Amer: 48 mL/min — ABNORMAL LOW (ref >60)
Glucose, Bld: 267 mg/dL — ABNORMAL HIGH (ref 70–99)
Potassium: 3.5 mmol/L (ref 3.5–5.1)
Sodium: 138 mmol/L (ref 135–145)

## 2020-03-13 LAB — GLUCOSE, CAPILLARY
Glucose-Capillary: 266 mg/dL — ABNORMAL HIGH (ref 70–99)
Glucose-Capillary: 173 mg/dL — ABNORMAL HIGH (ref 70–99)
Glucose-Capillary: 339 mg/dL — ABNORMAL HIGH (ref 70–99)
Glucose-Capillary: 209 mg/dL — ABNORMAL HIGH (ref 70–99)
Glucose-Capillary: 217 mg/dL — ABNORMAL HIGH (ref 70–99)

## 2020-03-13 LAB — SARS CORONAVIRUS 2 (TAT 6-24 HRS): SARS Coronavirus 2: NEGATIVE

## 2020-03-13 MED ORDER — METOPROLOL SUCCINATE ER 25 MG PO TB24
25.0000 mg | ORAL_TABLET | Freq: Every day | ORAL | Status: DC
  Administered 2020-03-14: 10:00:00 25 mg via ORAL
  Filled 2020-03-13: qty 1

## 2020-03-13 NOTE — Discharge Instructions (Signed)

## 2020-03-13 NOTE — Social Work (Signed)
Received insurance authorization [id number]. Good 3/20 thru 3/23. Contact person is Cristobal Goldmann, fax [phone].  Criss Alvine, CSW

## 2020-03-13 NOTE — Progress Notes (Signed)
Contacted Dr McDowell-Cardiology due to patient being bradycardia back to 40-50s after starting metoprolol. He recommended that if the patient is asymptomatic, then it would be ok to reduce the dose to 25mg  once daily.   Lattie Haw PGY-1 [place]

## 2020-03-13 NOTE — Progress Notes (Signed)
Pt had several episodes of unsustained bradycardia down to the upper 20's while asleep. Pt did not use Bipap machine d/t feeling of improper fit no matter how it is adjusted and stated she uses a different type of mask at home. O2 at 2 L/min via [place] administered.

## 2020-03-13 NOTE — Progress Notes (Signed)
Pt refuses to wear Bi-pap or CPAP.

## 2020-03-13 NOTE — Progress Notes (Signed)
Family Medicine Teaching Service Daily Progress Note Intern Pager: [phone]  Patient name: [name] Medical record number: [id number] Date of birth: Jul 02, 1951 Age: 69 y.o. Gender: female  Primary Care Provider: Shirley, [name], DO Consultants: Cardiology Code Status: FULL    Pt Overview and Major Events to Date:  3/14: Admitted  Assessment and Plan: 69yo female with PMH significant for hypothyroidism, DM, HFpEF, CVA, Afib, HTN, GERD, anemia, HLD, chronic back pain presented after an episode of syncope followed by bradycardia and received workup for etiology.   Syncope, bradycardia Denies dizziness. No repeats of syncope with bradycardia since admission. HR 50-60s. Cardiology and EP have signed off. Continues to have PACs without pauses (blocks).  -Management per cards -Continuous telemetry and pulse oximetry, loop recorder in place -EP considering adding antiarrhythmics OP -Vitals per floor routine  Hypokalemia- resolved K 3.5 today -Monitor with daily BMP  Hypomagnesemia- resolved Mg 1.7 today  -Daily Mg, replete as necessary  CAD, HTN, Afib Restarting metoprolol 50mg . BP elevated SBP 130-160, DBP 44--61 -Management per cards, not currently needing revascularization -Eliquis 5mg   -Metoprolol 50mg  daily  -Continue to monitor HR and BP   H/o CVA with residual left sided weakness, HLD   - PT/OT - continue statin  Hypothyroidism, stable TSH 2.978  -Continue Levothyroxine  T1DM, stable CBGs 173, 266 Home meds: Insulin aspart via patient's insulin pump -Continue insulin pump -Monitor CBGs  Normocytic anemia  leukopenia , stable  Hgb 11.8, WBC 4.2 today -Montitor with CBC  GERD Home meds: Protonix 40 mg -Continue Protonix  Depression Home meds: fluoxetine 30mg  daily -Continue fluoxetine  Elevated transaminases-improved AST 68>38, ALT 54>39  -CMP a.m.  Chronic Back Pain Home meds include: Gabapentin 300mg  qHS -Continue gabapentin  300 mg  FEN/GI: Heart healthy, Protonix Prophylaxis: Lovenox   Disposition: awaiting SNF placement. Is medically stable for discharge  Subjective:  Feeling "much better" today. Asked whether she had a "clot in her body" I explained that we did not suspect a clot on this admission and that this is unlikely, she understood and then expressed how grateful she is for the care she has received here.   Objective: Temp:  [97.5 F (36.4 C)-97.6 F (36.4 C)] 97.5 F (36.4 C) (03/20 0557) Pulse Rate:  [45-101] 45 (03/20 0916) Resp:  [11-18] 18 (03/20 0644) BP: (132-160)/(44-61) 133/55 (03/20 0913) SpO2:  [96 %-100 %] 100 % (03/20 0557) Weight:  [101.6 kg] 101.6 kg (03/20 0557)   Physical Exam:  General: Alert, pleasant, no acute distress, sitting up in bed eating  Cardio: Normal S1 and S2, RRR. No murmurs or rubs.   Pulm: CTAB anteriorly, no crackles, normal WOB Abdomen: Bowel sounds normal. Abdomen soft and non-tender.  Extremities: 1+ LE peripheral edema with hemosiderin skin changes. Warm/ well perfused.  Strong radial pulse Neuro: Cranial nerves grossly intact  Laboratory: Recent Labs  Lab 03/07/20 1505 03/11/20 0417 03/13/20 0352  WBC 3.3* 4.4 4.2  HGB 10.8* 10.9* 11.8*  HCT 34.7* 34.2* 37.1  PLT 168 178 198   Recent Labs  Lab 03/07/20 1505 03/07/20 1505 03/08/20 0338 03/09/20 0801 03/11/20 0417 03/12/20 0151 03/13/20 0352  NA 141   < > 144   < > 139 140 138  K 3.5   < > 3.8   < > 3.7 3.5 3.5  CL 110   < > 110   < > 104 103 102  CO2 26   < > 21*   < > 26 26 26   BUN  12   < > 11   < > 9 11 20   CREATININE 0.99   < > 0.91   < > 0.97 1.00 1.17*  CALCIUM 8.6*   < > 8.6*   < > 8.5* 9.0 8.8*  PROT 6.1*  --  5.4*  --   --   --   --   BILITOT 0.5  --  0.9  --   --   --   --   ALKPHOS 107  --  98  --   --   --   --   ALT 54*  --  39  --   --   --   --   AST 68*  --  38  --   --   --   --   GLUCOSE 189*   < > 148*   < > 217* 215* 267*   < > = values in this interval not  displayed.     Imaging/Diagnostic Tests: No results found.   Lattie Haw, MD 03/13/2020, 9:21 AM PGY-2, [place] Intern pager: [phone], text pages welcome

## 2020-03-14 DIAGNOSIS — Z743 Need for continuous supervision: Secondary | ICD-10-CM | POA: Diagnosis not present

## 2020-03-14 DIAGNOSIS — Z8673 Personal history of transient ischemic attack (TIA), and cerebral infarction without residual deficits: Secondary | ICD-10-CM | POA: Diagnosis not present

## 2020-03-14 DIAGNOSIS — K219 Gastro-esophageal reflux disease without esophagitis: Secondary | ICD-10-CM | POA: Diagnosis not present

## 2020-03-14 DIAGNOSIS — I10 Essential (primary) hypertension: Secondary | ICD-10-CM | POA: Diagnosis not present

## 2020-03-14 DIAGNOSIS — E109 Type 1 diabetes mellitus without complications: Secondary | ICD-10-CM | POA: Diagnosis not present

## 2020-03-14 DIAGNOSIS — D649 Anemia, unspecified: Secondary | ICD-10-CM | POA: Diagnosis not present

## 2020-03-14 DIAGNOSIS — Z7401 Bed confinement status: Secondary | ICD-10-CM | POA: Diagnosis not present

## 2020-03-14 DIAGNOSIS — R001 Bradycardia, unspecified: Secondary | ICD-10-CM | POA: Diagnosis not present

## 2020-03-14 DIAGNOSIS — I499 Cardiac arrhythmia, unspecified: Secondary | ICD-10-CM | POA: Diagnosis not present

## 2020-03-14 DIAGNOSIS — I5030 Unspecified diastolic (congestive) heart failure: Secondary | ICD-10-CM | POA: Diagnosis not present

## 2020-03-14 DIAGNOSIS — E039 Hypothyroidism, unspecified: Secondary | ICD-10-CM | POA: Diagnosis not present

## 2020-03-14 DIAGNOSIS — R55 Syncope and collapse: Secondary | ICD-10-CM | POA: Diagnosis not present

## 2020-03-14 DIAGNOSIS — F29 Unspecified psychosis not due to a substance or known physiological condition: Secondary | ICD-10-CM | POA: Diagnosis not present

## 2020-03-14 DIAGNOSIS — M2550 Pain in unspecified joint: Secondary | ICD-10-CM | POA: Diagnosis not present

## 2020-03-14 LAB — BASIC METABOLIC PANEL
Anion gap: 7 (ref 5–15)
BUN: 14 mg/dL (ref 8–23)
CO2: 25 mmol/L (ref 22–32)
Calcium: 8.6 mg/dL — ABNORMAL LOW (ref 8.9–10.3)
Chloride: 106 mmol/L (ref 98–111)
Creatinine, Ser: 0.88 mg/dL (ref 0.44–1.00)
GFR calc Af Amer: >60 mL/min (ref >60)
GFR calc non Af Amer: >60 mL/min (ref >60)
Glucose, Bld: 236 mg/dL — ABNORMAL HIGH (ref 70–99)
Potassium: 3.8 mmol/L (ref 3.5–5.1)
Sodium: 138 mmol/L (ref 135–145)

## 2020-03-14 LAB — MAGNESIUM: Magnesium: 1.6 mg/dL — ABNORMAL LOW (ref 1.7–2.4)

## 2020-03-14 LAB — GLUCOSE, CAPILLARY
Glucose-Capillary: 200 mg/dL — ABNORMAL HIGH (ref 70–99)
Glucose-Capillary: 246 mg/dL — ABNORMAL HIGH (ref 70–99)
Glucose-Capillary: 210 mg/dL — ABNORMAL HIGH (ref 70–99)

## 2020-03-14 MED ORDER — METOPROLOL SUCCINATE ER 25 MG PO TB24: 25.0000 mg | ORAL_TABLET | Freq: Every day | ORAL | Status: DC

## 2020-03-14 MED ORDER — MAGNESIUM SULFATE 2 GM/50ML IV SOLN
2.0000 g | Freq: Once | INTRAVENOUS | Status: AC
  Administered 2020-03-14: 11:00:00 2 g via INTRAVENOUS
  Filled 2020-03-14: qty 50

## 2020-03-14 NOTE — Progress Notes (Addendum)
Family Medicine Teaching Service Daily Progress Note Intern Pager: [phone]  Patient name: [name] Medical record number: [id number] Date of birth: [birth year] Age: 69 y.o. Gender: female  Primary Care Provider: Shirley, [name], DO Consultants: Cardiology Code Status: FULL    Pt Overview and Major Events to Date:  3/14: Admitted  Assessment and Plan: 69yo female with PMH significant for hypothyroidism, DM, HFpEF, CVA, Afib, HTN, GERD, anemia, HLD, chronic back pain presented after an episode of syncope followed by bradycardia and received workup for etiology.   Syncope, bradycardia w/ pacemaker No syncopal episodes overnight. HR range 45-62 in last 24 hours. Decreased metoprolol to 25 mg yesterday after EP recs to restart for long QT. Cardiology and EP have signed off. Continues to have PACs. Echo on 3/15 with EF 60-65%.   F/U outpatient to add antiarrhythmics if necessary   Continue tele   Hypokalemia Hypomagnesemia K 3.8, Mg 1.6. Goal K >4, Mg > 2  IV 2g Mg bolus this AM  CAD, HTN, Afib  Metoprolol cut to 25mg . BP 133-139/49-55. Cards signed off, outpatient follow up.   Continue daily eliquis, metoprolol  Tele   H/o CVA with residual left sided weakness, HLD    PT/OT recs for SNF   Continue statin  Hypothyroidism  Continue Levothyroxine  T1DM, stable CBGs 200-339. Patient has insulin pump, aspart. Unsure if continuous monitor.   Continue home insulin pump   CBG 5x daily   Normocytic anemia  leukopenia , stable   F/u CBC  GERD  Continue Protonix  Depression  Continue fluoxetine  Chronic Back Pain Home meds include: Gabapentin 300mg  qHS  Continue gabapentin 300 mg  FEN/GI: Heart healthy, Protonix Prophylaxis: Lovenox   Disposition: awaiting SNF placement. Is medically stable for discharge  Subjective:  No acute events overnight   Objective: Temp:  [97.9 F (36.6 C)-98.2 F (36.8 C)] 98.2 F (36.8 C) (03/21  0510) Pulse Rate:  [45-71] 60 (03/21 0510) Resp:  [17-18] 17 (03/21 0510) BP: (133-139)/(49-55) 139/55 (03/21 0510) SpO2:  [98 %-99 %] 98 % (03/21 0510) Weight:  [102.1 kg] 102.1 kg (03/21 0510)   Physical Exam: General: NAD, non-toxic, well-appearing, sleeping comfortably   Cardiovascular: irregular, bradycardic. No BLEE Respiratory: No IWOB.  Extremities: Warm and well perfused.  Integumentary: No obvious rashes, lesions, trauma on general exam.  Laboratory: Recent Labs  Lab 03/07/20 1505 03/11/20 0417 03/13/20 0352  WBC 3.3* 4.4 4.2  HGB 10.8* 10.9* 11.8*  HCT 34.7* 34.2* 37.1  PLT 168 178 198   Recent Labs  Lab 03/07/20 1505 03/07/20 1505 03/08/20 0338 03/09/20 0801 03/11/20 0417 03/12/20 0151 03/13/20 0352  NA 141   < > 144   < > 139 140 138  K 3.5   < > 3.8   < > 3.7 3.5 3.5  CL 110   < > 110   < > 104 103 102  CO2 26   < > 21*   < > 26 26 26   BUN 12   < > 11   < > 9 11 20   CREATININE 0.99   < > 0.91   < > 0.97 1.00 1.17*  CALCIUM 8.6*   < > 8.6*   < > 8.5* 9.0 8.8*  PROT 6.1*  --  5.4*  --   --   --   --   BILITOT 0.5  --  0.9  --   --   --   --   ALKPHOS 107  --  98  --   --   --   --   ALT 54*  --  39  --   --   --   --   AST 68*  --  38  --   --   --   --   GLUCOSE 189*   < > 148*   < > 217* 215* 267*   < > = values in this interval not displayed.   Imaging/Diagnostic Tests: No results found.  Wilber Oliphant, MD 03/14/2020, 6:24 AM PGY-2, [place] Intern pager: [phone], text pages welcome

## 2020-03-14 NOTE — TOC Transition Note (Signed)
Transition of Care [facility]) - CM/SW Discharge Note   Patient Details  Name: [name] MRN: [id number] Date of Birth: Apr 19, 1951  [facility] of [facility]) CM/SW Contact:  Bary Castilla, LCSW Phone Number: [phone] 03/14/2020, 3:16 PM   Clinical Narrative:     Patient will DC to: [place]? Anticipated DC date:?03/14/20 Family notified:?Barbara Transport [id number]   Per MD patient ready for DC to [place], patient, patient's family, and facility notified of DC. Discharge Summary sent to facility. RN given number for report [phone] room [address]. DC packet on chart. Ambulance transport requested for patient.   CSW signing off.   [place], [place] [phone]   Final next level of care: Skilled Nursing Facility Barriers to Discharge: No Barriers Identified   Patient Goals and CMS Choice Patient states their goals for this hospitalization and ongoing recovery are:: To return home CMS Medicare.gov Compare Post Acute Care list provided to:: Patient Choice offered to / list presented to : Patient  Discharge Placement              Patient chooses bed at: [facility] Patient to be transferred to facility by: [place] Name of family member notified: [name] Patient and family notified of of transfer: 03/14/20  Discharge Plan and Services                                     Social Determinants of Health (SDOH) Interventions     Readmission Risk Interventions Readmission Risk Prevention Plan 04/17/2019  Transportation Screening Complete  PCP or Specialist Appt within 3-5 Days Complete  HRI or [place] Complete  Social Work Consult for [place] Planning/Counseling Complete  Palliative Care Screening Not Applicable  Medication Review Press photographer) Complete  Some recent data might be hidden

## 2020-03-14 NOTE — Social Work (Signed)
PASRR [id number]

## 2020-03-14 NOTE — Progress Notes (Signed)
Attempted to call report to [facility] x2

## 2020-03-14 NOTE — TOC Progression Note (Signed)
Transition of Care [facility]) - Progression Note    Patient Details  Name: [name] MRN: [id number] Date of Birth: [birth year]  Transition [facility] CM/SW [place], [place] Phone Number: [phone] 03/14/2020, 10:54 AM  Clinical Narrative:     Patient COVID is back as well as insurance authorization. CSW attempted to call facility and had to leave a message.  CSW will continue to follow for discharge planning needs.  Expected Discharge Plan: [place] Barriers to Discharge: Continued Medical Work up, Ship broker  Expected Discharge Plan and Services Expected Discharge Plan: [place]       Living arrangements for the past 2 months: Single Family Home                                       Social Determinants of Health (SDOH) Interventions    Readmission Risk Interventions Readmission Risk Prevention Plan 04/17/2019  Transportation Screening Complete  PCP or Specialist Appt within 3-5 Days Complete  HRI or [place] Complete  Social Work Consult for [place] Planning/Counseling Complete  Palliative Care Screening Not Applicable  Medication Review Press photographer) Complete  Some recent data might be hidden

## 2020-03-16 ENCOUNTER — Telehealth: Payer: Self-pay | Admitting: Cardiology

## 2020-03-16 ENCOUNTER — Telehealth: Payer: Self-pay | Admitting: Family Medicine

## 2020-03-16 ENCOUNTER — Other Ambulatory Visit: Payer: Self-pay | Admitting: Family Medicine

## 2020-03-16 NOTE — Telephone Encounter (Signed)
Will forward to MD. Evea Sheek,CMA  

## 2020-03-16 NOTE — Telephone Encounter (Signed)
Called patient and discussed her concerns. Patient was not sure they received the proper medications from the hospital. Per the discharge summary patient was to be discharged on decreased dose of metoprolol 25mg  and losartan 100mg . Informed patient and called [facility] and confirmed with nurse that patient is receiving those medications.

## 2020-03-16 NOTE — Telephone Encounter (Signed)
Patient calls and states 'I don't know whats going on, but I'm not getting my meds and its making me feel bad.' She first states its just her Eliquis she isn't getting and then says she's not getting any of her medications. I asked patient if she needed refills and she said she thinks she does. Patient says she is at [facility] [address] and wants someone to bring her her meds. Told patient I'm not sure if the pharmacy does delivery. Patient would like for Dr. Enid Derry to call her back.   Patients calls from phone number: [phone]  Patients pharmacy is [facility] on [address].

## 2020-03-16 NOTE — Telephone Encounter (Signed)
New message   Per [facility] rehab wants to know if the labs can be drawn at facility. Please call to discuss.

## 2020-03-16 NOTE — Telephone Encounter (Signed)
Called son and he informed me that she has since been given her medications after he spoke with their Education officer, museum. He was appreciative of call.

## 2020-03-16 NOTE — Telephone Encounter (Signed)
Will forward to MD. Ronel Rodeheaver,CMA  

## 2020-03-16 NOTE — Telephone Encounter (Signed)
Patient's son [name] is calling and would like to speak with Dr. Enid Derry. Patient is in a nursing home for rehab but he is having issues with them handling her medications correctly, specifically her insulin.   Please call [name] to discuss. [phone].

## 2020-03-17 ENCOUNTER — Other Ambulatory Visit: Payer: Self-pay | Admitting: Family Medicine

## 2020-03-17 MED ORDER — METOPROLOL SUCCINATE ER 25 MG PO TB24: 25.0000 mg | ORAL_TABLET | Freq: Every day | ORAL | 0 refills | Status: DC

## 2020-03-17 NOTE — Telephone Encounter (Signed)
Patient calls nurse line and states that she is no longer in the rehab facility. Patient reports that she needs her metoprolol sent to the pharmacy. We were unsure if the rehab facility would be dispensing her medications or if we would need to send in new rx.   Please advise  Talbot Grumbling, RN

## 2020-03-18 ENCOUNTER — Other Ambulatory Visit: Payer: Medicare PPO

## 2020-03-18 ENCOUNTER — Encounter: Payer: Self-pay | Admitting: Family Medicine

## 2020-03-18 MED ORDER — PANTOPRAZOLE SODIUM 40 MG PO TBEC: 40.0000 mg | DELAYED_RELEASE_TABLET | Freq: Two times a day (BID) | ORAL | 0 refills | Status: DC

## 2020-03-19 ENCOUNTER — Telehealth: Payer: Self-pay

## 2020-03-19 NOTE — Telephone Encounter (Signed)
Manual transmission reviewed.  Loletha Grayer episode also appears nocturnal

## 2020-03-19 NOTE — Telephone Encounter (Signed)
Linq alert received- 4 second pause and brady event.  Pause occurred at 0347 on 03/17/20.  No EGM/ details avail for [facility] event.  Need manual transmission.  Current meds include Metoprolol 25mg  daily.   Spoke with pt.  She reports she usually sleeps until 5am daily, however lately she has not been sleeping well and reports she may have been awake.  She denies any symptoms.  Pt confirms med compliance with Metoprolol.  Pt unable to send manual transmission at time of call due to needing physical assistance reaching monitor.  Her sister will be there today after 5pm and can help her then.    Once info on [place] episode is received, it can be reviewed to determine if follow-up needed.

## 2020-03-22 ENCOUNTER — Telehealth: Payer: Self-pay | Admitting: Emergency Medicine

## 2020-03-22 ENCOUNTER — Ambulatory Visit: Payer: Medicare PPO | Admitting: Family Medicine

## 2020-03-22 NOTE — Telephone Encounter (Signed)
Pt retuning [name] call. I told her the nurse will call her back.

## 2020-03-22 NOTE — Telephone Encounter (Signed)
Patient reports she was hospitalized after initial Covid 19 vaccine due to bradycardia. losarten dose increased to 100 mg daily and Toprol XLdose decreased to 25 mg daily at discharge from hospital. Patient was asymptomatic during event recorded on 03/21/20. No report of CP, SOB, syncope, dizziness. ED precautions given. Patient voiced that she has difficulty getting transportation to appointments and expressed she may not be able to make 04/09/20 appointment. Stressed importance of keeping follow up with [place] [facility].

## 2020-03-22 NOTE — Telephone Encounter (Signed)
The pt was returning [place] call. She states she did not here the phone ring. She states the nurse can call her at this number as well [phone].

## 2020-03-22 NOTE — Telephone Encounter (Signed)
LMOM. Need to assess patient for symptomatic bradycardia 03/21/20 at 1218.

## 2020-03-22 NOTE — Telephone Encounter (Signed)
LMOM  To call clinic.

## 2020-03-23 ENCOUNTER — Encounter: Payer: Self-pay | Admitting: Family Medicine

## 2020-03-24 ENCOUNTER — Ambulatory Visit (INDEPENDENT_AMBULATORY_CARE_PROVIDER_SITE_OTHER): Payer: Medicare PPO | Admitting: *Deleted

## 2020-03-24 DIAGNOSIS — I480 Paroxysmal atrial fibrillation: Secondary | ICD-10-CM | POA: Diagnosis not present

## 2020-03-24 LAB — CUP PACEART REMOTE DEVICE CHECK
Date Time Interrogation Session: 20210329000500
Date Time Interrogation Session: 20210331032506
Implantable Pulse Generator Implant Date: 20191230
Implantable Pulse Generator Implant Date: 20191230

## 2020-03-24 NOTE — Progress Notes (Signed)
ILR Remote 

## 2020-03-25 ENCOUNTER — Other Ambulatory Visit: Payer: Self-pay

## 2020-03-25 ENCOUNTER — Telehealth (INDEPENDENT_AMBULATORY_CARE_PROVIDER_SITE_OTHER): Payer: Medicare PPO | Admitting: Family Medicine

## 2020-03-25 DIAGNOSIS — Z993 Dependence on wheelchair: Secondary | ICD-10-CM

## 2020-03-26 NOTE — Progress Notes (Signed)
[facility] Telemedicine Visit  Patient consented to have virtual visit. Method of visit: Video  Encounter participants: Patient: [name] - located at home Provider: [name] Rosamund Nyland - located at [facility]  Others (if applicable): n/a  Chief Complaint: evaluation for electric wheelchair  HPI:  Patient with h/o multiple CVA's would like an electric wheelchair to be able to complete tasks in her home so she can do more ADL's. She is able to operate a electronic wheelchair.   ROS: per HPI  Pertinent PMHx: type 1 diabetes, A. fib, h/o of CVAs, hypothyroidism, HTN, HLD, h/o HFpEF, GERD, depression/anxiety, chronic back pain  Exam:  General: NAD, well-appearing Respiratory: able to speak in complete sentences without issue  Assessment/Plan:  Patient visit requesting she have a  power wheelchair. I am referring patient for a PT Mobility/Seating evaluation for power wheelchair. She has a PT appointment on 4/6.   Time spent during visit with patient: 6 minutes  [name] Nikyla Navedo, DO PGY-3, [place]

## 2020-03-29 ENCOUNTER — Telehealth: Payer: Self-pay

## 2020-03-29 DIAGNOSIS — Z8673 Personal history of transient ischemic attack (TIA), and cerebral infarction without residual deficits: Secondary | ICD-10-CM

## 2020-03-29 DIAGNOSIS — W19XXXS Unspecified fall, sequela: Secondary | ICD-10-CM

## 2020-03-29 NOTE — Telephone Encounter (Signed)
Order placed for [facility].

## 2020-03-29 NOTE — Telephone Encounter (Signed)
Patients insurance company calls nurse line to request home health for patient. [employer] uses [facility] for their home health services. Per insurance advocate, the patient needs help bathing and dressing herself, a nurse aide when be beneficial to patient and perhaps other home health services. Please advise.

## 2020-03-30 ENCOUNTER — Other Ambulatory Visit: Payer: Self-pay

## 2020-03-30 ENCOUNTER — Ambulatory Visit: Payer: Medicare PPO | Attending: Family Medicine | Admitting: Physical Therapy

## 2020-03-30 DIAGNOSIS — R2681 Unsteadiness on feet: Secondary | ICD-10-CM | POA: Diagnosis not present

## 2020-03-30 DIAGNOSIS — R2689 Other abnormalities of gait and mobility: Secondary | ICD-10-CM | POA: Insufficient documentation

## 2020-03-30 DIAGNOSIS — M6281 Muscle weakness (generalized): Secondary | ICD-10-CM | POA: Insufficient documentation

## 2020-03-31 ENCOUNTER — Encounter: Payer: Self-pay | Admitting: Physical Therapy

## 2020-03-31 NOTE — Therapy (Signed)
[facility] [address] [place], [place], [address] Phone: [phone]   Fax:  [phone]  Physical Therapy Evaluation  Patient Details  Name: [name] MRN: [id number] Date of Birth: 1951/06/28 Referring Provider (PT): [name] Shirley, DO cc:  Dr. Madison Hickman  Encounter Date: 03/30/2020  PT End of Session - 03/31/20 2105    Visit Number  1    Authorization Type  [employer]    PT Start Time  [id number]    PT Stop Time  1135    PT Time Calculation (min)  72 min    Activity Tolerance  Patient tolerated treatment well    Behavior During Therapy  [facility] for tasks assessed/performed       Past Medical History:  Diagnosis Date  . Abnormal echocardiogram   . Anemia   . Arthritis   . BPPV (benign paroxysmal positional vertigo), right 06/13/2017  . Cerebrovascular accident (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 ([place]) 10/29/2015  . CHF (congestive heart failure) ([place])   . Closed fracture of left proximal humerus 07/08/2019  . Depression   . Dizzy 03/07/2020   bradycardia  . GERD (gastroesophageal reflux disease)   . Hemiparesis affecting left side as late effect of cerebrovascular accident (CVA) ([place]) 03/08/2020  . Hemorrhoids   . History of Falls with injury 03/08/2020   Fall resulting in left humeral fracture  . Hyperlipidemia   . Hypertension   . Hypothyroidism   . Intracranial vascular stenosis   . Seasonal allergies   . Stroke ([place])   . Thyroid cancer ([place])    per pt S/p Total Thyroidectomy with Radioactive Iodine Therapy  . Type 1 diabetes mellitus ([place])     Past Surgical History:  Procedure Laterality Date  . ABDOMINAL ANGIOGRAM N/A 05/14/2012   Procedure: ABDOMINAL ANGIOGRAM;  Surgeon: Laverda Page, MD;  Location: [facility] CATH LAB;  Service: Cardiovascular;  Laterality: N/A;  . ABDOMINAL HYSTERECTOMY  1995   partial  . ANKLE FRACTURE SURGERY Right   . CARDIAC CATHETERIZATION     with coronary  angiogram  . COLONOSCOPY N/A 09/09/2014   Procedure: COLONOSCOPY;  Surgeon: Gatha Mayer, MD;  Location: [facility];  Service: Endoscopy;  Laterality: N/A;  . ESOPHAGOGASTRODUODENOSCOPY (EGD) WITH PROPOFOL N/A 12/03/2018   Procedure: ESOPHAGOGASTRODUODENOSCOPY (EGD) WITH PROPOFOL;  Surgeon: Doran Stabler, MD;  Location: [facility];  Service: Gastroenterology;  Laterality: N/A;  . LEFT AND RIGHT HEART CATHETERIZATION WITH CORONARY ANGIOGRAM N/A 05/14/2012   Procedure: LEFT AND RIGHT HEART CATHETERIZATION WITH CORONARY ANGIOGRAM;  Surgeon: Laverda Page, MD;  Location: [facility] CATH LAB;  Service: Cardiovascular;  Laterality: N/A;  . LOOP RECORDER INSERTION N/A 12/23/2018   Procedure: LOOP RECORDER INSERTION;  Surgeon: Constance Haw, MD;  Location: [facility] CV LAB;  Service: Cardiovascular;  Laterality: N/A;  . MINI METER   09/08/2014   ELECTRONIC INSULIN PUMP  . TEE WITHOUT CARDIOVERSION N/A 10/16/2018   Procedure: TRANSESOPHAGEAL ECHOCARDIOGRAM (TEE);  Surgeon: Buford Dresser, MD;  Location: [facility] ENDOSCOPY;  Service: Cardiovascular;  Laterality: N/A;  . THYROIDECTOMY  2006    There were no vitals filed for this visit.   Subjective Assessment - 03/31/20 2058    Subjective  Pt presents for power wheelchair eval - with Josh Cadle, ATP with [facility];  pt is amb. with use of UP walker    Pertinent History  CVA 2016, HTN, thyroid CA, arthritis, CHF, depression, DM1, A-fib    Patient Stated  Goals  to obtain power wheelchair    Currently in Pain?  No/denies         [facility] PT Assessment - 03/31/20 0001      Assessment   Medical Diagnosis  h/o Rt CVA with Lt hemiparesis:  s/p Lt humerus fracture due to fall in March 2021    Referring Provider (PT)  [name] Shirley, DO    Onset Date/Surgical Date  --   2016     Precautions   Precautions  Fall      Restrictions   Weight Bearing Restrictions  No      Balance Screen   Has the patient fallen in the past 6 months   Yes    How many times?  1    Has the patient had a decrease in activity level because of a fear of falling?   Yes    Is the patient reluctant to leave their home because of a fear of falling?   No                Objective measurements completed on examination: See above findings.     LMN for power wheelchair to be completed - Josh Cadle, ATP with [place] present for eval                   Plan - 03/31/20 2107    Clinical Impression Statement  Pt evaluated for Group 2 power wheelchair - [employer] recommended;  Josh Cadle, ATP with [facility] present for eval    Personal Factors and Comorbidities  Fitness;Comorbidity 2;Finances;Time since onset of injury/illness/exacerbation;Past/Current Experience    Examination-Activity Limitations  Locomotion Level;Squat    Examination-Participation Restrictions  Community Activity;Shop;Laundry;Meal Prep    Stability/Clinical Decision Making  Evolving/Moderate complexity    Clinical Decision Making  Moderate    Rehab Potential  Good    PT Frequency  One time visit    PT Treatment/Interventions  Other (comment)   wheelchair evaluation only   PT Next Visit Plan  N/A - eval only    Recommended Other Services  power wheelchair [employer] recommended - [place] and Agree with Plan of Care  Patient       Patient will benefit from skilled therapeutic intervention in order to improve the following deficits and impairments:  Difficulty walking, Decreased balance, Decreased strength  Visit Diagnosis: Other abnormalities of gait and mobility - Plan: PT plan of care cert/re-cert  Muscle weakness (generalized) - Plan: PT plan of care cert/re-cert  Unsteadiness on feet - Plan: PT plan of care cert/re-cert     Problem List Patient Active Problem List   Diagnosis Date Noted  . Chest tightness   . Near syncope 03/08/2020  . Chronic anticoagulation 03/08/2020  . Hemiparesis and  numbness affecting left side as late effect of cerebrovascular accident (CVA) ([place]) 03/08/2020  . History of Falls with injury 03/08/2020  . Hypoalbuminemia 03/08/2020  . Proteinuria 03/08/2020  . Heart block atrioventricular 03/08/2020  . Bradycardia 03/07/2020  . Dependent for wheelchair mobility 08/05/2019  . OSA (obstructive sleep apnea) 05/27/2019  . Paroxysmal atrial fibrillation ([place]) 05/27/2019  . Insulin pump in place   . (HFpEF) heart failure with preserved ejection fraction ([place]) 03/17/2019  . 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 Hypertension associated with diabetes ([place]) 10/29/2015  . 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 ([place])   . Lower extremity edema 08/20/2013  . Morbid  obesity ([place]) 05/29/2013  . 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 ([place]) [id number]  . Hypothyroidism 01/25/2012  . GERD (gastroesophageal reflux disease) 01/25/2012    [name], [place], PT 03/31/2020, 9:23 PM  [facility] [address] [place], [place], [address] Phone: [phone]   Fax:  [phone]  Name: [name] MRN: [id number] Date of Birth: 06-29-1951

## 2020-04-02 ENCOUNTER — Ambulatory Visit: Payer: Medicare PPO | Admitting: Cardiology

## 2020-04-02 ENCOUNTER — Telehealth: Payer: Self-pay

## 2020-04-02 NOTE — Telephone Encounter (Signed)
Linq alert received- 3 sec pause, during what might be nocturnal hours  Pt with history of SB during sleep.  Need to confirm sleeping hours and if not symptoms.

## 2020-04-02 NOTE — Telephone Encounter (Signed)
Pt returned phone call, she confirmed that she was sleeping at time of pause.  She denies any current cardiac symptoms.

## 2020-04-05 NOTE — Telephone Encounter (Signed)
LINQ alerts received for additional pause/brady episodes. Will need manual transmission for review of all ECGs. Some episodes nocturnal. Daytime brady episode noted 04/03/20 at 15:22, duration 9sec. Daytime pause episode noted 04/02/20 at 09:12, duration 5sec.  LMOVM requesting call back from patient. Direct DC number provided.

## 2020-04-07 NOTE — Telephone Encounter (Signed)
The pt tried sending a transmission with her monitor but she received the error code [id number]. The are replacing her handheld. She will receive it in 5-7 business days. I let her know that she has an appointment on April-19-2021 with RU. The pt became comfortable know that her monitor still sends as automatic transmissions. I gave her my direct office number in case she has any questions or concerns. The pt verbalized understanding and thanked me for the call.

## 2020-04-07 NOTE — Telephone Encounter (Signed)
Pt left a message on my voicemail stating someone tried to call her. I did not see a phone note from anyone. I told her I was the last person that left a note in her chart. I told her if she have any questions to call me on my direct office number.

## 2020-04-07 NOTE — Telephone Encounter (Addendum)
Received [facility] transmission for LINQ event of 1 new pause 04/06/20 @ 0858. Called patient to assess and request manual transmission. Patient states she feels fine and has no complaints. States she is home eating breakfast. Patient reports of not being able to get to [facility] monitor d/c chair in the way. Patient uses scooter for assistance. States family will be home this evening and can help with transmission after 5:00 pm. Patient advised to wait until family is home to help that this is not an emergency. Patient is high fall risk and writer explained to patient since she feels good it is fine to wait until someone is able to help her. Patient verbalizes understanding.

## 2020-04-08 ENCOUNTER — Encounter: Payer: Self-pay | Admitting: Family Medicine

## 2020-04-08 NOTE — Telephone Encounter (Signed)
Pt had further episode of bradycardia yesterday afternoon.  Per Dr. Curt Bears assess symptoms, if asymptomatic, nothing further at this time; keep appointment with EP APP next week.  LMOM to call back device clinic.

## 2020-04-08 NOTE — Telephone Encounter (Signed)
Pt denies symptoms.  Keep follow up for 4/19.

## 2020-04-09 DIAGNOSIS — G4733 Obstructive sleep apnea (adult) (pediatric): Secondary | ICD-10-CM | POA: Diagnosis not present

## 2020-04-10 NOTE — Progress Notes (Signed)
Cardiology Office Note Date:  04/12/2020  Patient ID:  [name], DOB 19-Apr-1951, MRN [id number] PCP:  Shirley, [name], DO  Cardiologist:  Dr. Marlou Porch EP: Dr. Curt Bears    Chief Complaint: bradycardia on her loop transmission  History of Present Illness: [name] is a 69 y.o. female with history of HTN, DM (type I), HLD, thyroid cancer s/p thyroidectomy, stroke (2016 and 2019), GERD, normal coronaries by cath in 2013, HFpEF, significant atrial ectopy, and via loop found to have AFib.  She was hospitalized 3/14/32021 2/2 syncope.  after a fall vs unclear syncope.  Observations of HR 30's-50's with frequent blocked PACs.  Her home toprol 136m daily was held on admission. She was bradycardic (30's-50's) on EMS arrival and throughout transport by EMS with no further symptoms. Telemetry noted frequent blocked PACs Loop recorer interrogation during her admission AF burden has been minimal of late, much better since Aug 2020, overall burden 7.0% (since Jan 2020) Also noted that tracings printed of AF episodes are false and 2/2 PACs and blocked PACs No clear evidence of any significant conduction  System disease was noted and resumed on 1/2 her home Toprol dose.  She had significant coronary Ca++, FFR showed hemodynamically significant stenosis in the ostial PLB and distal LAD that are 235mdiameter vessels.  Medical therapy recommended.  Unable to pursue flecainide. She was discharged to SNF 2/2 general functional decline and falls.  Since is back home   Device clinic has had a number of notes in her chart since her d/c of bradycardic alerts,  Some nocturnal, others day time, none with associated symptoms, in d/w Dr. CaCurt Bearscontinue to monitor. Planned to have f/u with EP APP.  She comes in today accompanied by her cousin (who is like a brother).  On her way in from the waiting area she required help with a near fall episode.  In d/w the patient and her cousin,  they both state that her feet got tangle up and she lost her balance.  She mentions that they have ordered her a wheelchair but has not yet arrived. When asked if she felt dizzy or lightheaded or unusually weak she said no.  States that since her stroke, she has trouble with balance and keeping her feet straight under neather her.  She feels off balance most of the time, and says she isn't sure if it is balance or dizzy, but she is certain that at least since her hospital stay in March that she has not fainted, or nearly fainted.  She continues to have occasional chest tightness, this is random, no positional or exertional, pre-dates her hospital stay last month, goes "way back".  No SOB, no palpitations    Device information MDT ILR, implanted 12/23/2018   Past Medical History:  Diagnosis Date  . Abnormal echocardiogram   . Anemia   . Arthritis   . BPPV (benign paroxysmal positional vertigo), right 06/13/2017  . Cerebrovascular accident (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 ([facility]/03[place]2015  . CHF (congestive heart failure) ([facility] [place]  . Closed fracture of left proximal humerus 07/08/2019  . Depression   . Dizzy 03/07/2020   bradycardia  . GERD (gastroesophageal reflux disease)   . Hemiparesis affecting left side as late effect of cerebrovascular accident (CVA) ([facility]3/15/2021  . Hemorrhoids   . History of Falls with injury 03/08/2020   Fall resulting in left humeral fracture  . Hyperlipidemia   . Hypertension   . Hypothyroidism   .  Intracranial vascular stenosis   . Seasonal allergies   . Stroke ([place])   . Thyroid cancer ([place])    per pt S/p Total Thyroidectomy with Radioactive Iodine Therapy  . Type 1 diabetes mellitus ([place])     Past Surgical History:  Procedure Laterality Date  . ABDOMINAL ANGIOGRAM N/A 05/14/2012   Procedure: ABDOMINAL ANGIOGRAM;  Surgeon: Laverda Page, MD;  Location: [facility] CATH LAB;  Service: Cardiovascular;  Laterality: N/A;  . ABDOMINAL HYSTERECTOMY   1995   partial  . ANKLE FRACTURE SURGERY Right   . CARDIAC CATHETERIZATION     with coronary angiogram  . COLONOSCOPY N/A 09/09/2014   Procedure: COLONOSCOPY;  Surgeon: Gatha Mayer, MD;  Location: [place];  Service: Endoscopy;  Laterality: N/A;  . ESOPHAGOGASTRODUODENOSCOPY (EGD) WITH PROPOFOL N/A 12/03/2018   Procedure: ESOPHAGOGASTRODUODENOSCOPY (EGD) WITH PROPOFOL;  Surgeon: Doran Stabler, MD;  Location: [place];  Service: Gastroenterology;  Laterality: N/A;  . LEFT AND RIGHT HEART CATHETERIZATION WITH CORONARY ANGIOGRAM N/A 05/14/2012   Procedure: LEFT AND RIGHT HEART CATHETERIZATION WITH CORONARY ANGIOGRAM;  Surgeon: Laverda Page, MD;  Location: [facility] CATH LAB;  Service: Cardiovascular;  Laterality: N/A;  . LOOP RECORDER INSERTION N/A 12/23/2018   Procedure: LOOP RECORDER INSERTION;  Surgeon: Constance Haw, MD;  Location: [place] CV LAB;  Service: Cardiovascular;  Laterality: N/A;  . MINI METER   09/08/2014   ELECTRONIC INSULIN PUMP  . TEE WITHOUT CARDIOVERSION N/A 10/16/2018   Procedure: TRANSESOPHAGEAL ECHOCARDIOGRAM (TEE);  Surgeon: Buford Dresser, MD;  Location: [facility] ENDOSCOPY;  Service: Cardiovascular;  Laterality: N/A;  . THYROIDECTOMY  2006    Current Outpatient Medications  Medication Sig Dispense Refill  . ACCU-CHEK GUIDE test strip     . acetaminophen (TYLENOL) 500 MG tablet Take 1,000 mg by mouth as needed for mild pain.    Marland Kitchen apixaban (ELIQUIS) 5 MG TABS tablet Take 1 tablet (5 mg total) by mouth 2 (two) times daily. 180 tablet 2  . atorvastatin (LIPITOR) 80 MG tablet Take 1 tablet (80 mg total) by mouth daily. (Patient taking differently: Take 80 mg by mouth every evening. ) 90 tablet 2  . Blood Glucose Monitoring Suppl (ACCU-CHEK GUIDE) w/Device KIT     . CRANBERRY EXTRACT PO Take 1 capsule by mouth daily.    . fexofenadine (ALLEGRA) 30 MG tablet Take 30 mg by mouth daily as needed (allergies).     Marland Kitchen FLUoxetine (PROZAC) 20 MG tablet  Take 1 tablet (20 mg total) by mouth daily. Take with 52m for 374mtotal. (Patient taking differently: Take 20 mg by mouth daily. Take along with 1053mablet per patient) 90 tablet 2  . gabapentin (NEURONTIN) 300 MG capsule TAKE ONE CAPSULE BY MOUTH AT BEDTIME 90 capsule 3  . insulin aspart (NOVOLOG) 100 UNIT/ML injection To be used in patient's insulin pump 30 mL 0  . Insulin Human (INSULIN PUMP) SOLN Inject 1 each into the skin continuous. Novolog insulin 1 each 0  . levothyroxine (SYNTHROID, LEVOTHROID) 175 MCG tablet Take 175 mcg by mouth daily before breakfast.   12  . losartan (COZAAR) 100 MG tablet TAKE 1 TABLET BY MOUTH DAILY 90 tablet 3  . pantoprazole (PROTONIX) 40 MG tablet Take 1 tablet (40 mg total) by mouth 2 (two) times daily before a meal. 180 tablet 0  . potassium chloride (K-DUR) 10 MEQ tablet Take 1 tablet by mouth on days toresmide is taken (Patient taking differently: Take 10 mEq by mouth daily as needed (  For low potassium). Take along with torsemide when taken) 30 tablet 2  . torsemide (DEMADEX) 10 MG tablet TAKE 1 TABLET BY MOUTH DAILY AS NEEDED FOR FLUID (Patient taking differently: Take 10 mg by mouth daily as needed (fluid). Take along with potassium when taken) 90 tablet 0  . amLODipine (NORVASC) 2.5 MG tablet Take 1 tablet (2.5 mg total) by mouth daily. 90 tablet 1   No current facility-administered medications for this visit.    Allergies:   Latex   Social History:  The patient  reports that she has never smoked. She has never used smokeless tobacco. She reports that she does not drink alcohol or use drugs.   Family History:  The patient's family history includes Diabetes in her maternal aunt; Early death in her father; Heart attack in her maternal grandfather and mother; Heart disease in her maternal aunt, maternal grandfather, and mother; Heart failure in her maternal grandfather; Hyperlipidemia in her father and mother; Hypertension in her father and mother;  Prostate cancer in her paternal uncle; Renal Disease in her mother; Stroke in her son.  ROS:  Please see the history of present illness.   All other systems are reviewed and otherwise negative.   PHYSICAL EXAM:  VS:  BP (!) 160/68   Pulse 84   Ht 5' 7"  (1.702 m)   Wt 239 lb (108.4 kg)   SpO2 92%   BMI 37.43 kg/m  BMI: Body mass index is 37.43 kg/m. Well nourished, well developed, in no acute distress  HEENT: normocephalic, atraumatic  Neck: no JVD, carotid bruits or masses Cardiac:  RRR; no significant murmurs, no rubs, or gallops Lungs:  CTA b/l, no wheezing, rhonchi or rales  Abd: soft, nontender MS: no deformity or atrophy Ext: trace edema, chronic looking skin changes b/l lE Skin: warm and dry, no rash Neuro:  No gross deficits appreciated Psych: euthymic mood, full affect  ILR site is stable, no tethering or discomfort   EKG:  Done today and reviewed by myself shows  2:1 AV block, V rate 35bpm, QRS 159m > rhythm strip SR 60's  ILR interroation done today and reviewed by myself:  Battery is good 10 brady, 6 pause episodes EGMs sionce her last transmission are all reviewed.  These are tre brady events, some are clearly blocked APCs, others less clear Pause episodes are also true Some look like CHB (with A rates 110's) 3-5second pauses, otherse looks like sinus pauses    CT CORONARY MORPH W/CTA COR W/SCORE W/CA W/CM &/OR WO/CM Addendum Date: 03/10/2020   IMPRESSION:  1. 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 CT FFR will be performed and reported separately. 2. The patient's coronary artery calcium score is 1371, which places the patient in the 99th percentile. Motion artifact from arrhythmia impacts the quantitation of coronary calcium which maybe overestimated. 3. Normal coronary origin with right dominance. 4. Poor contrast opacification of the tip of the left atrial appendage. Cannot exclude LA  appendage thrombus vs incomplete contrast filling  Echocardiogram 03/08/2020 IMPRESSIONS  1. Left ventricular ejection fraction, by estimation, is 60 to 65%. Left  ventricular ejection fraction by PLAX is 63 %. The left ventricle has  normal function. The left ventricle has no regional wall motion  abnormalities. Left ventricular diastolic  parameters are indeterminate.  2. Right ventricular systolic function is normal. The right ventricular  size is normal. There is moderately elevated pulmonary artery systolic  pressure.  3. Left atrial size was mildly dilated.  4. The mitral valve is normal in structure. Mild mitral valve  regurgitation. No evidence of mitral stenosis.  5. Tricuspid valve regurgitation is mild to moderate.  6. The aortic valve is tricuspid. Aortic valve regurgitation is not  visualized. No aortic stenosis is present.  7. The inferior vena cava is dilated in size with >50% respiratory  variability, suggesting right atrial pressure of 8 mmHg.    10/14/18: TTE Study Conclusions - Procedure narrative: Transthoracic echocardiography. Technically difficult study. Intravenous contrast (Definity) was administered. - Left ventricle: The cavity size was normal. There was moderate concentric hypertrophy. Systolic function was normal. The estimated ejection fraction was in the range of 60% to 65%. Wall motion was normal; there were no regional wall motion abnormalities. The study is not technically sufficient to allow evaluation of LV diastolic function. - Left atrium: The atrium was normal in size. - Right atrium: The atrium was normal in size. Impressions: - Technically difficult study. Afib is noted. LVEF 60-65%, moderate LVH, normal wall motion, normal biatrial size.   Recent Labs: 03/07/2020: TSH 2.978 03/08/2020: ALT 39 03/13/2020: Hemoglobin 11.8; Platelets 198 03/14/2020: BUN 14; Creatinine, Ser 0.88; Magnesium 1.6; Potassium 3.8; Sodium  138  No results found for requested labs within last 8760 hours.   CrCl cannot be calculated (Patient's most recent lab result is older than the maximum 21 days allowed.).   Wt Readings from Last 3 Encounters:  04/12/20 239 lb (108.4 kg)  03/14/20 225 lb 1.4 oz (102.1 kg)  11/12/19 242 lb (109.8 kg)     Other studies reviewed: Additional studies/records reviewed today include: summarized above  ASSESSMENT AND PLAN:  1. Paroxysmal AFib     CHA2DS2asc is 10, on Eliquis, appropriately dosed      2. Bradycardia     Not clearly symptomatic     She was in 2:1 AVblock for her EKG and without symptoms     She has had balance problems and an unstable gait since her stroke, she denies for certain any syncope since her hospital stay last month      She got tangled on her feet walking in, she did not feel like this was a weak, dizzy or near syncope.  She took her Toprol last evening at 5:00PM  I discussed (via telephone) with Dr. Curt Bears.  He had seen her transmissions, discussed on programmer, pauses appear to be CHB with faster A rates.  Stop her Toprol and continue to monitor her via her loop with 2 mo follow up.  Discussed with the patient and cousin.  If any near fainting or fainting to seek medical attention Discussed she may need pacer in the future if slow HR does not improve off the Toprol  3. CAD     Intermittent chest tightness     known branch vessel disease     Cards recs in the hospital were to start amlodipine if more CP  4. HTN     Quite high initially, a recheck was 148/88   Disposition: Stop metoprolol and start amlodipine 2.27m QD, see Dr. CCurt Bearsin 2 mo, sooner if needed.    Current medicines are reviewed at length with the patient today.  The patient did not have any concerns regarding medicines  Signed, RTommye Standard PA-C 04/12/2020 1:15 PM     [place] [phone](office)  [phone] (fax)

## 2020-04-12 ENCOUNTER — Ambulatory Visit: Payer: Medicare PPO | Admitting: Physician Assistant

## 2020-04-12 ENCOUNTER — Other Ambulatory Visit: Payer: Self-pay

## 2020-04-12 VITALS — BP 160/68 | HR 84 | Ht 67.0 in | Wt 239.0 lb

## 2020-04-12 DIAGNOSIS — I5032 Chronic diastolic (congestive) heart failure: Secondary | ICD-10-CM

## 2020-04-12 DIAGNOSIS — I10 Essential (primary) hypertension: Secondary | ICD-10-CM

## 2020-04-12 DIAGNOSIS — I2510 Atherosclerotic heart disease of native coronary artery without angina pectoris: Secondary | ICD-10-CM | POA: Diagnosis not present

## 2020-04-12 DIAGNOSIS — I480 Paroxysmal atrial fibrillation: Secondary | ICD-10-CM | POA: Diagnosis not present

## 2020-04-12 DIAGNOSIS — I441 Atrioventricular block, second degree: Secondary | ICD-10-CM

## 2020-04-12 MED ORDER — AMLODIPINE BESYLATE 2.5 MG PO TABS: 2.5000 mg | ORAL_TABLET | Freq: Every day | ORAL | 1 refills | Status: DC

## 2020-04-12 NOTE — Patient Instructions (Addendum)
Medication Instructions:   STOOP TAKING METOPROLOL 25 MG   START AMLODIPINE 2.5 MG ONCE A DAY   *If you need a refill on your cardiac medications before your next appointment, please call your pharmacy*   Lab Work: NONE ORDERED  TODAY   If you have labs (blood work) drawn today and your tests are completely normal, you will receive your results only by: Marland Kitchen MyChart Message (if you have MyChart) OR . A paper copy in the mail If you have any lab test that is abnormal or we need to change your treatment, we will call you to review the results.   Testing/Procedures: NONE ORDERED  TODAY   Follow-Up: At [facility], you and your health needs are our priority.  As part of our continuing mission to provide you with exceptional heart care, we have created designated Provider Care Teams.  These Care Teams include your primary Cardiologist (physician) and Advanced Practice Providers (APPs -  Physician Assistants and Nurse Practitioners) who all work together to provide you with the care you need, when you need it.  We recommend signing up for the patient portal called "MyChart".  Sign up information is provided on this After Visit Summary.  MyChart is used to connect with patients for Virtual Visits (Telemedicine).  Patients are able to view lab/test results, encounter notes, upcoming appointments, etc.  Non-urgent messages can be sent to your provider as well.   To learn more about what you can do with MyChart, go to [email or web address].    Your next appointment:   2 month(s)  The format for your next appointment:   In Person  Provider:    You may see  Dr. Curt Bears      Other Instructions

## 2020-04-15 ENCOUNTER — Other Ambulatory Visit (HOSPITAL_COMMUNITY): Payer: Self-pay | Admitting: Family Medicine

## 2020-04-15 ENCOUNTER — Other Ambulatory Visit: Payer: Self-pay | Admitting: Family Medicine

## 2020-04-15 MED ORDER — TORSEMIDE 10 MG PO TABS: 10.0000 mg | ORAL_TABLET | Freq: Every day | ORAL | 0 refills | Status: DC | PRN

## 2020-04-15 NOTE — Telephone Encounter (Signed)
Transmission to pharmacy failed. Resent as ordered.  Talbot Grumbling, RN

## 2020-04-15 NOTE — Addendum Note (Signed)
Addended by: Talbot Grumbling on: 04/15/2020 08:42 AM   Modules accepted: Orders

## 2020-04-19 ENCOUNTER — Encounter: Payer: Self-pay | Admitting: Family Medicine

## 2020-04-24 LAB — CUP PACEART REMOTE DEVICE CHECK
Date Time Interrogation Session: 20210501032915
Implantable Pulse Generator Implant Date: 20191230

## 2020-04-26 ENCOUNTER — Ambulatory Visit (INDEPENDENT_AMBULATORY_CARE_PROVIDER_SITE_OTHER): Payer: Medicare PPO | Admitting: *Deleted

## 2020-04-26 DIAGNOSIS — I6302 Cerebral infarction due to thrombosis of basilar artery: Secondary | ICD-10-CM | POA: Diagnosis not present

## 2020-04-27 NOTE — Progress Notes (Signed)
[facility] Summary Report / Loop Recorder 

## 2020-04-28 ENCOUNTER — Ambulatory Visit: Payer: Medicare PPO | Admitting: Family Medicine

## 2020-04-28 ENCOUNTER — Other Ambulatory Visit: Payer: Self-pay

## 2020-04-28 ENCOUNTER — Encounter: Payer: Self-pay | Admitting: Family Medicine

## 2020-04-28 DIAGNOSIS — E1159 Type 2 diabetes mellitus with other circulatory complications: Secondary | ICD-10-CM | POA: Diagnosis not present

## 2020-04-28 DIAGNOSIS — I152 Hypertension secondary to endocrine disorders: Secondary | ICD-10-CM

## 2020-04-28 DIAGNOSIS — I10 Essential (primary) hypertension: Secondary | ICD-10-CM

## 2020-04-28 NOTE — Telephone Encounter (Signed)
Pt returned my call.  Informed pt that since stopping the Metoprolol we have not seen any further bradycardia/2:1 AVB.  We will continue monitoring for now and let her know if we see anything abnormal occur again. Patient verbalized understanding and agreeable to plan.    She also reports still having elevated BPs.  Advised pt to contact PCP to discuss medication for BP.  Pt agreeable.

## 2020-04-28 NOTE — Patient Instructions (Addendum)
Thank you for coming to see me today. It was a pleasure! Today we talked about:   For your blood pressure, please take 2 tablets of the amlodipine 2.5 mg to total 5 mg.  Please let me know when you run out of these and I will send you 5 mg tablets.  Please continue taking your losartan.  I will follow up with you in 1 week in order to recheck your blood pressure.  Expect a message for me on mychart as well.  If you develop any headache, chest pain, shortness of breath, change in vision then please not hesitate to go to the emergency room.  Please follow-up with me next Wednesday 5/12 at 10:10 AM.  If you have any questions or concerns, please do not hesitate to call the office at [phone].  Take Care,   [name] Rayquon Uselman, DO

## 2020-04-28 NOTE — Telephone Encounter (Signed)
lmtcb

## 2020-04-28 NOTE — Progress Notes (Signed)
   SUBJECTIVE:   CHIEF COMPLAINT / HPI:   Hypertension: Patient reports that she has been having some issues with her blood pressure being elevated.  She states she stopped her losartan and was recently started on amlodipine 2.5 mg.  She is previously taken off of metoprolol given her bradycardic episode that caused her to be admitted to the hospital.  She states she has still had some elevated blood pressures at home.  Had patient call family member who is at home in order to check medications to ensure that she is taking losartan as she was not supposed to stop this per her discharge summary.  Patient was confused by which pill this was an did confirm that she is taking losartan 100 mg daily with family member. Denies any SOB, CP, vision changes, LE edema, medication SEs, or symptoms of hypotension  Patient is also anxious to get her chair and is upset that she has not been able to get it at this time given that her son has had his electronic wheelchair and he started the process at the same time she did.  PERTINENT  PMH / PSH: T1DM on insulin pump, Atrial fibrillation, Previous multiple CVAs, Hypothyroidism, HFpEF, HTN, Chronic back pain, GERD, Depression/anxiety, HLD  OBJECTIVE:  BP (!) 154/100   Pulse (!) 57   SpO2 98%   General: NAD, pleasant Neck: Supple Cardiovascular: RRR, no m/r/g, no LE edema Respiratory: CTA BL, normal work of breathing Psych: AOx3, appropriate affect  ASSESSMENT/PLAN:   Hypertension associated with diabetes ([place]) BP in office 154/100, patient currently asymptomatic.  Patient confirmed with family member that she is continuing to take her losartan 100 mg daily.  Recently started on amlodipine 2.5 mg given elevated blood pressure after discontinuing metoprolol due to bradycardia.  Will increase amlodipine to 5 mg daily and have patient follow-up in 1 week for recheck of blood pressure or sooner if patient develops any symptoms.    [name] Cloteal Isaacson, DO PGY-3,  [facility]

## 2020-04-29 ENCOUNTER — Telehealth: Payer: Self-pay | Admitting: Family Medicine

## 2020-04-29 ENCOUNTER — Encounter: Payer: Self-pay | Admitting: Family Medicine

## 2020-04-29 NOTE — Telephone Encounter (Signed)
Will forward to MD to make her aware this is in her box.  Trina Asch,CMA

## 2020-04-29 NOTE — Telephone Encounter (Signed)
Patient is calling to see if we have received the order for her wheel chair. I informed her that we received the paperwork and I have placed it in Dr.Shirley's box.

## 2020-04-30 NOTE — Telephone Encounter (Signed)
Patient calls nurse line checking the status of paperwork. Patient stated she was sorry to keep calling, however "I keep falling and its very important." Patient stated this has been an ongoing thing since last year. I apologized to patient and informed her we would let her know once the paperwork was faxed off.

## 2020-04-30 NOTE — Telephone Encounter (Signed)
Called patient and informed her that I have received paperwork, however, Dr. Owens Shark must sign as the [facility] provider and she is currently out until Monday. Will fax on Monday. Patient appreciated the phone call.

## 2020-05-03 ENCOUNTER — Encounter: Payer: Self-pay | Admitting: Family Medicine

## 2020-05-03 ENCOUNTER — Encounter: Payer: Self-pay | Admitting: Adult Health

## 2020-05-03 ENCOUNTER — Telehealth (INDEPENDENT_AMBULATORY_CARE_PROVIDER_SITE_OTHER): Payer: Medicare PPO | Admitting: Adult Health

## 2020-05-03 DIAGNOSIS — G4730 Sleep apnea, unspecified: Secondary | ICD-10-CM | POA: Diagnosis not present

## 2020-05-03 DIAGNOSIS — I6350 Cerebral infarction due to unspecified occlusion or stenosis of unspecified cerebral artery: Secondary | ICD-10-CM | POA: Diagnosis not present

## 2020-05-03 DIAGNOSIS — I10 Essential (primary) hypertension: Secondary | ICD-10-CM | POA: Diagnosis not present

## 2020-05-03 DIAGNOSIS — I480 Paroxysmal atrial fibrillation: Secondary | ICD-10-CM

## 2020-05-03 DIAGNOSIS — E1159 Type 2 diabetes mellitus with other circulatory complications: Secondary | ICD-10-CM | POA: Diagnosis not present

## 2020-05-03 DIAGNOSIS — E785 Hyperlipidemia, unspecified: Secondary | ICD-10-CM

## 2020-05-03 DIAGNOSIS — Z794 Long term (current) use of insulin: Secondary | ICD-10-CM

## 2020-05-03 NOTE — Assessment & Plan Note (Addendum)
BP in office 154/100, patient currently asymptomatic.  Patient confirmed with family member that she is continuing to take her losartan 100 mg daily.  Recently started on amlodipine 2.5 mg given elevated blood pressure after discontinuing metoprolol due to bradycardia.  Will increase amlodipine to 5 mg daily and have patient follow-up in 1 week for recheck of blood pressure or sooner if patient develops any symptoms.

## 2020-05-03 NOTE — Progress Notes (Signed)
[facility] [address] [place]. [place] [address] [phone]       OFFICE FOLLOW UP NOTE  [name] Date of Birth:  Jun 14, 1951 Medical Record Number:  [id number]   Reason for Referral: BiPAP and stroke follow-up [facility] stroke provider: Dr. Leonie Man [facility] sleep provider: Dr. Rexene Alberts   Virtual Visit via Video Note  I connected with [name] on 05/03/20 at  8:45 AM EDT by a video enabled telemedicine application located remotely in my own home and verified that I am speaking with the correct person using two identifiers who was located at their own home.   I discussed the limitations of evaluation and management by telemedicine and the availability of in person appointments. The patient expressed understanding and agreed to proceed.     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  She has been stable from a stroke standpoint with residual left hemiparesis with ongoing use of Eliquis and atorvastatin for secondary stroke prevention.  Continues to follow with cardiology regularly with recent changes of antihypertensives due to elevated blood pressures.  Reports glucose levels stable.  In regards to sleep apnea, compliance report from 04/04/2019 -05/02/2020 shows 30 out of 30 usage days with 21 days greater than 4 hours for 70% compliance.  Average usage 4 hours and 51 minutes.  Residual AHI 22.8 -apnea index central 2.3 and obstructive 19.4.  Leaks in the 95th percentile 3.5 L/min.  IPAP 22 and EPAP 16.  Overall tolerating well but will have occasional mask leaking with resolution after adjustment.  Denies recent weight gain and actually in the process of weight loss.      History provided for reference purposes only Update via virtual visit 02/03/2020 JM: [name] is a 69 year old female who is being  seen today via virtual visit for stroke follow-up and initial BiPAP visit.    Stroke: She has been stable from a stroke standpoint with residual left hemiparesis which has been stable.  She continues to have difficulty from prior left humerus fracture which has been interfering with ambulation and use of rolling walker but she has been continuing to take short walks daily.  She uses electronic wheelchair for long distance transportation.  Continues on Eliquis and atorvastatin for secondary stroke prevention without side effects.  Blood pressure and glucose levels monitored at home which have been stable.  Sleep apnea: At prior visit, adequate compliance of CPAP but continued to have elevated AHI at 29.9.  She underwent titration study on 10/22/2019 due to inadequate treatment of sleep apnea on AutoPap and CPAP which showed significant improvement of sleep apnea with use of BiPAP.  She initiated BiPAP usage on 11/09/2019.  Compliance report 01/03/2020 -02/01/2020 shows 30 out of 30 usage days with 10 days greater than 4 hours usage.  Average usage 3 hours and 33 minutes.  Residual AHI 25.8.  Leaks in the 95th percentile 7 L/min. IPAP 22 and EPAP 16.  She states initially had difficulty tolerating and would have to take it off after approximately 4 hours because it would start to make "noises" and have increased leaking around the mask.  She did speak to her DME company who advised her the need of readjusting when this occurs which has helped decrease leakage.  She does report waking up frequently throughout the night having to void  and at times will forget to place her mask back on.  She does believe overall she feels better during the day and sleeping better with use of BiPAP.  Her sister assist with cleaning the machine daily.  No further concerns at this time.  Update via virtual visit 09/29/2019 JM: [name] is a 69 year old female who is being seen today through telemedicine via MyChart for stroke  follow-up and initial CPAP compliance visit.  Stroke: Residual stroke deficits of left hemiparesis.  She was recently evaluated by her PCP for request of electric wheelchair due to increased difficulty with ambulation with long distance resulting in frequent falls. She has since received an electric w/c for long distance. She was evaluated in the ED on 07/06/2019 due to a fall resulting in left humerus fracture.  She does endorse improvement of function. Continues to follow with orthopedics. Continues to ambulate with RW without any recent falls. Continues on Eliquis without bleeding or bruising. Continues on atorvastatin without myalgias.  Blood pressure monitored at home which has been stable.  No further concerns at this time.  Sleep apnea on CPAP: She underwent split-night study on 05/26/2019 which showed severe obstructive sleep apnea with AHI 78.4/h and recommended initiation of AutoPap for management.  Unable to undergo CPAP titration studies at the time of diagnosis due to pandemic restrictions.  Initial compliance report from 08/26/2019- 09/24/2019 shows 30 out of 30 usage days for 100% compliance and 28 days greater than 4 hours for 93% compliance.  Average usage 6 hours and 38 minutes.  Residual AHI 29.9.  Apnea index central 18.9, obstructive 7.4 and unknown 0.5.  Leaks in the 95th percentile 11.4 L/min.  Pressure in the 95th percentile 14.0 cm H2O with a min pressure 7 cm H2O and max pressure 15 cm H2O with EPR level 2.  She has been tolerating well with improvement of her sleep. She uses [employer] daily to clean her CPAP. Continues to follow with [employer] for supplies needed.  No concerns regarding her machine.  Update 02/19/2019 JM: [name] is a 69 year old female who returns today for follow-up after right pontine infarct in 09/2018.  She unfortunately sustained a couple falls and was evaluated in the ED on 12/13/2018 for right knee pain.  Per review of the ED notes, she was fearful of returning home  due to safety concerns and was requesting to be placed at rehab facility therefore she was discharged to [facility] for ongoing therapy.  She has been since discharged home.  Unfortunately due to lack of transportation, she was unable to participate in therapies prior to 12/13/2018 admission.  As she has since obtained SCAT assistance, she is currently participating in physical therapy at neuro rehab for ongoing deficits and overall feeling of being weaker leading to increased difficulty in walking. She currently lives with her sister who works full time leaving patient at home during the day. She is able to maintain ADL's without assistance and able to cook and clean without assistance. She is currently using rollator walker with a recent fall on Sunday stating her feet "got twisted". She did attempt 30 day cardiac monitor but due to allergic skin reaction, unable to fully complete. she underwent loop recorder placement on 12/23/2018 which has shown atrial fibrillation on 02/09/2019 and has appointment to follow-up in AF clinic today for discussion of initiating [place].  She continues on Plavix at this time without side effects of bleeding or bruising.  Continues on atorvastatin without side effects myalgias.  Blood pressure today initially elevated but upon manual recheck 148/90.  She continues to monitor glucose levels at home and have been ranging in the low 100s.  Denies new or worsening stroke/TIA symptoms.  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  She was previously followed in this office Dr. Erlinda Hong for prior stroke.  History obtained from patient and chart review. Reviewed all radiology images and labs personally.  [name] [name] is a 69 y.o. female with history of HTN, HLD, prior stroke, CHF, type I DB, thyroid cancer presenting with L hemiparesis and  L sided numbness.  CT head reviewed and was negative for acute infarct but did show old left pontine lacune infarct.  CTA head and neck stable since imaging in 2016 which showed high-grade stenosis bilateral ICA segments and bilateral distal left VA V4.  MRI brain reviewed and showed right pontine infarct along with small vessel disease and old pontine lacunar infarcts.  2D echo showed an EF of 60 to 65% without evidence of cardiac thrombus but did show atrial fibrillation.  As there is no clear evidence on telemetry monitor of atrial fibrillation, TEE performed which was unremarkable without evidence of thrombus or PFO.  Recommended to undergo 30-day event cardiac monitor to rule out atrial fibrillation as during 2D echo showed possible AF.  Recommended a 30-day cardiac event monitor does not demonstrate AF, loop recorder will be considered.  Patient was on Plavix PTA and recommended DAPT for 3 weeks and then single agent alone.  LDL 48 and recommended continuation of atorvastatin 40 mg daily.  A1c 9.8 and recommended tight glycemic control with close PCP follow-up for DM management.  HTN stable during admission recommended long-term goal normotensive range.  Therapy recommended home health PT/OT and was discharged in stable condition.  Patient is being seen today for hospital follow-up.  She did have cardiac monitor started on 10/28/2018 and will be completed on 11/27/2018. She continues to have left sided weakness with some improvement. She does endorse numbness and heaviness sensation in left leg. She does endorse muscle stiffness in left arm and neck but denies pain. She has recently completed home health PT/OT. She would like to continue outpatient therapy at our neuro rehab. She continues to use rollator walker when she is outside which she was using prior. She continues on asprin and plavix without side effects of bleeding or bruising. Recent episode of GERD symptoms and was told this could possibly be due to  use of aspirin. Continues to take lipitor without side effects of myalgias. Blood pressure satisfactory 142/71.  No further concerns at this time.  Denies new or worsening stroke/TIA symptoms. On a side note, she does complain of left toe pain and swelling which per review of epic notes, she has reached out to her PCP in this regards and is recommended to be seen in office as this could be possibly related to gout or a different type of condition that may need to be worked up.  She states she has a difficult time scheduling too many appointments soon together as she is unable to drive and does not want her family members missing too much work.  She does have an appointment with endocrinology on Monday and after that we will be being seen by cardiology for completion of 30-day cardiac monitor and then plans on scheduling appointment with PCP.  ROS:   14 system review of systems performed and negative with exception of see HPI  PMH:  Past Medical History:  Diagnosis Date  . Abnormal echocardiogram   . Anemia   . Arthritis   . BPPV (benign paroxysmal positional vertigo), right 06/13/2017  . Cerebrovascular accident (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 ([place]) 10/29/2015  . CHF (congestive heart failure) ([place])   . Closed fracture of left proximal humerus 07/08/2019  . Depression   . Dizzy 03/07/2020   bradycardia  . GERD (gastroesophageal reflux disease)   . Hemiparesis affecting left side as late effect of cerebrovascular accident (CVA) ([place]) 03/08/2020  . Hemorrhoids   . History of Falls with injury 03/08/2020   Fall resulting in left humeral fracture  . Hyperlipidemia   . Hypertension   . Hypothyroidism   . Intracranial vascular stenosis   . Seasonal allergies   . Stroke ([place])   . Thyroid cancer ([place])    per pt S/p Total Thyroidectomy with Radioactive Iodine Therapy  . Type 1 diabetes mellitus ([facility])     PSH:  Past Surgical History:  Procedure Laterality Date  . ABDOMINAL  ANGIOGRAM N/A 05/14/2012   Procedure: ABDOMINAL ANGIOGRAM;  Surgeon: Laverda Page, MD;  Location: [facility] CATH LAB;  Service: Cardiovascular;  Laterality: N/A;  . ABDOMINAL HYSTERECTOMY  1995   partial  . ANKLE FRACTURE SURGERY Right   . CARDIAC CATHETERIZATION     with coronary angiogram  . COLONOSCOPY N/A 09/09/2014   Procedure: COLONOSCOPY;  Surgeon: Gatha Mayer, MD;  Location: [facility];  Service: Endoscopy;  Laterality: N/A;  . ESOPHAGOGASTRODUODENOSCOPY (EGD) WITH PROPOFOL N/A 12/03/2018   Procedure: ESOPHAGOGASTRODUODENOSCOPY (EGD) WITH PROPOFOL;  Surgeon: Doran Stabler, MD;  Location: [facility];  Service: Gastroenterology;  Laterality: N/A;  . LEFT AND RIGHT HEART CATHETERIZATION WITH CORONARY ANGIOGRAM N/A 05/14/2012   Procedure: LEFT AND RIGHT HEART CATHETERIZATION WITH CORONARY ANGIOGRAM;  Surgeon: Laverda Page, MD;  Location[facility] C[facility] LAB;  Service: Cardiovascular;  Laterality: N/A;  . LOOP RECORDER INSERTION N/A 12/23/2018   Procedure: LOOP RECORDER INSERTION;  Surgeon: Constance Haw, MD;  Location: [facility] CV LAB;  Service: Cardiovascular;  Laterality: N/A;  . MINI METER   09/08/2014   ELECTRONIC INSULIN PUMP  . TEE WITHOUT CARDIOVERSION N/A 10/16/2018   Procedure: TRANSESOPHAGEAL ECHOCARDIOGRAM (TEE);  Surgeon: Buford Dresser, MD;  Location: [facility] ENDOSCOPY;  Service: Cardiovascular;  Laterality: N/A;  . THYROIDECTOMY  2006    Social History:  Social History   Socioeconomic History  . Marital status: Divorced    Spouse name: Not on file  . Number of children: 1  . Years of education: Not on file  . Highest education level: Not on file  Occupational History  . Occupation: retired Pharmacist, hospital  Tobacco Use  . Smoking status: Never Smoker  . Smokeless tobacco: Never Used  Substance and Sexual Activity  . Alcohol use: No  . Drug use: No  . Sexual activity: Not on file  Other Topics Concern  . Not on file  Social History Narrative    Retired Pharmacist, hospital, high school teacher taught mass. One son, helping to care for her granddaughter. No caffeine. Updated 07/20/2014.   Social Determinants of Health   Financial Resource Strain:   . Difficulty of Paying Living Expenses:   Food Insecurity:   . Worried About Charity fundraiser in the Last Year:   . [place] in the Last Year:  Transportation Needs:   . Film/video editor (Medical):   Marland Kitchen Lack of Transportation (Non-Medical):   Physical Activity:   . Days of Exercise per Week:   . Minutes of Exercise per Session:   Stress:   . Feeling of Stress :   Social Connections:   . Frequency of Communication with Friends and Family:   . Frequency of Social Gatherings with Friends and Family:   . Attends Religious Services:   . Active Member of Clubs or Organizations:   . Attends Archivist Meetings:   Marland Kitchen Marital Status:   Intimate Partner Violence:   . Fear of Current or Ex-Partner:   . Emotionally Abused:   Mar[place] Kitchen Physically Abused:   . Sexually Abused:     Family History:  Family History  Problem Relation Age of Onset  . Heart disease Mother   . Hyperlipidemia Mother   . Hypertension Mother   . Renal Disease Mother        insufficiency  . Heart attack Mother        in his 27's  . Early death Father        Head Injury at Work  . Hyperlipidemia Father   . Hypertension Father   . Stroke Son   . Diabetes Maternal Aunt   . Prostate cancer Paternal Uncle   . Heart disease Maternal Aunt   . Heart failure Maternal Grandfather   . Heart disease Maternal Grandfather   . Heart attack Maternal Grandfather        Died in his 29's  . Breast cancer Neg Hx     Medications:   Current Outpatient Medications on File Prior to Visit  Medication Sig Dispense Refill  . ACCU-CHEK GUIDE test strip     . acetaminophen (TYLENOL) 500 MG tablet Take 1,000 mg by mouth as needed for mild pain.    Marland Kitchen amLODipine (NORVASC) 2.5 MG tablet Take 1 tablet (2.5 mg total) by mouth  daily. 90 tablet 1  . apixaban (ELIQUIS) 5 MG TABS tablet Take 1 tablet (5 mg total) by mouth 2 (two) times daily. 180 tablet 2  . atorvastatin (LIPITOR) 80 MG tablet Take 1 tablet (80 mg total) by mouth daily. (Patient taking differently: Take 80 mg by mouth every evening. ) 90 tablet 2  . Blood Glucose Monitoring Suppl (ACCU-CHEK GUIDE) w/Device KIT     . CRANBERRY EXTRACT PO Take 1 capsule by mouth daily.    . fexofenadine (ALLEGRA) 30 MG tablet Take 30 mg by mouth daily as needed (allergies).     Marland Kitchen FLUoxetine (PROZAC) 20 MG tablet Take 1 tablet (20 mg total) by mouth daily. Take with 34m for 352mtotal. (Patient taking differently: Take 20 mg by mouth daily. Take along with 1038mablet per patient) 90 tablet 2  . gabapentin (NEURONTIN) 300 MG capsule TAKE ONE CAPSULE BY MOUTH AT BEDTIME 90 capsule 3  . insulin aspart (NOVOLOG) 100 UNIT/ML injection To be used in patient's insulin pump 30 mL 0  . Insulin Human (INSULIN PUMP) SOLN Inject 1 each into the skin continuous. Novolog insulin 1 each 0  . levothyroxine (SYNTHROID, LEVOTHROID) 175 MCG tablet Take 175 mcg by mouth daily before breakfast.   12  . losartan (COZAAR) 100 MG tablet TAKE 1 TABLET BY MOUTH DAILY (Patient not taking: Reported on 04/28/2020) 90 tablet 3  . pantoprazole (PROTONIX) 40 MG tablet Take 1 tablet (40 mg total) by mouth 2 (two) times daily before a meal. 180 tablet  0  . potassium chloride (KLOR-CON) 10 MEQ tablet TAKE 1 TABLET BY MOUTH DAILY AS NEEDED FOR LOW POTASSIUM.(TAKE ALONG WITH TORSEMIDE) 90 tablet 0  . torsemide (DEMADEX) 10 MG tablet Take 1 tablet (10 mg total) by mouth daily as needed. 90 tablet 0  . [DISCONTINUED] losartan (COZAAR) 25 MG tablet Take 1 tablet (25 mg total) by mouth at bedtime. 90 tablet 3  . [DISCONTINUED] ondansetron (ZOFRAN-ODT) 4 MG disintegrating tablet DISSOLVE ONE TABLET BY MOUTH EVERY 8 HOURS AS NEEDED FOR NAUSEA AND VOMITING 20 tablet 0  . [DISCONTINUED] pantoprazole (PROTONIX) 40 MG  tablet Take 1 tablet (40 mg total) by mouth daily. 90 tablet 0   No current facility-administered medications on file prior to visit.    Allergies:   Allergies  Allergen Reactions  . Latex Rash     Physical Exam  General: well developed, well nourished, pleasant middle-aged African-American female, seated, in no evident distress Head: head normocephalic and atraumatic.    Neurologic Exam Mental Status: Awake and fully alert.  Normal speech and language.  Oriented to place and time. Recent and remote memory intact. Attention span, concentration and fund of knowledge appropriate. Mood and affect appropriate.  Cranial Nerves: Extraocular movements full without nystagmus. Hearing intact to voice. Facial sensation intact. Face, tongue, palate moves normally and symmetrically.  Shoulder shrug symmetric. Motor: No evidence of large muscle weakness per drift assessment; slightly decreased left hand dexterity Sensory.: intact to light touch Coordination: Rapid alternating movements normal in all extremities except slightly decreased left hand. Finger-to-nose and heel-to-shin performed accurately bilaterally. Gait and Station: Deferred due to visit type and limited assistance for patient during visit Reflexes: UTA       ASSESSMENT: [name] is a 69 y.o. year old female with history of right pontine infarct on 10/13/2018 secondary to small vessel disease with residual left hemiparesis and gait impairment. Vascular risk factors include HTN, HLD, prior stroke, CHF, DM and thyroid cancer.  She had ILR placed due to frequent atrial ectopy on telemetry and showed atrial fibrillation on 02/09/2019 and initiated Eliquis.  Sleep study on 05/26/2019 showed severe sleep apnea and CPAP initiated.  At prior visit in 09/2019, adequate compliance of CPAP but due to continued elevated AHI, she underwent titration study on 10/22/2019 which showed great improvement of AHI on use of BiPAP.  She  initiated BiPAP on 11/09/2019.  AHI slightly improved after increasing compliance and decreasing leaks but continues to be elevated at 22.8    PLAN:  1. Severe sleep apnea: Discussion with Dr. Rexene Alberts regarding continued elevated AHI despite adequate compliance and low leaks.  Recommended adjusting pressure from 22/16 to 22/18 and will review in 1 month for further evaluation.  Order placed to [facility] for ongoing supplies and pressure adjustment.  Advised importance of ongoing use especially with difficulty of blood pressure control, history of atrial fibrillation and stroke history. 2. Right pontine infarct: Continue Eliquis (apixaban) daily  and atorvastatin 80 mg for secondary stroke prevention.  Maintain strict control of hypertension with blood pressure goal below 130/90, diabetes with hemoglobin A1c goal below 6.5% and cholesterol with LDL cholesterol (bad cholesterol) goal below 70 mg/dL.  I also advised the patient to eat a healthy diet with plenty of whole grains, cereals, fruits and vegetables, exercise regularly with at least 30 minutes of continuous activity daily and maintain ideal body weight. 3. HTN: Reports uncontrolled blood pressure -advised ongoing follow-up with PCP/cardiology for ongoing monitoring management 4. HLD:  Continue atorvastatin and ongoing follow-up with PCP/cardiology for ongoing prescribing, monitoring and management 5. DMII: Continue to follow with PCP for monitoring and management 6. Atrial fibrillation: Continuation of Eliquis and ongoing follow-up with cardiology for monitoring and management   Will review compliance data in 1 month and will determine follow-up at that time   I spent 29 minutes of non-face-to-face time with patient.  This included previsit chart review, lab review, study review, order entry, electronic health record documentation, patient education regarding BiPAP use, history of stroke, importance of managing stroke risk factors and  answered all questions to patient satisfaction     Frann Rider, [facility]  [facility] [address] [place], [place] [address]  Phone [phone] Fax [phone] Note: This document was prepared with digital dictation and possible smart phrase technology. Any transcriptional errors that result from this process are unintentional.  I reviewed the above note and documentation by the Nurse Practitioner and agree with the history, exam, assessment and plan as outlined above. I was available for consultation. Star Age, MD, PhD [facility])

## 2020-05-04 NOTE — Progress Notes (Signed)
New orders for DME supplies have been faxed to [facility]. Confirmation received

## 2020-05-04 NOTE — Progress Notes (Signed)
I agree with the above plan 

## 2020-05-05 ENCOUNTER — Encounter: Payer: Self-pay | Admitting: Family Medicine

## 2020-05-05 ENCOUNTER — Other Ambulatory Visit: Payer: Self-pay

## 2020-05-05 ENCOUNTER — Ambulatory Visit: Payer: Medicare PPO | Admitting: Family Medicine

## 2020-05-05 VITALS — BP 138/70 | HR 64 | Ht 67.0 in | Wt 241.0 lb

## 2020-05-05 DIAGNOSIS — I10 Essential (primary) hypertension: Secondary | ICD-10-CM | POA: Diagnosis not present

## 2020-05-05 DIAGNOSIS — E1159 Type 2 diabetes mellitus with other circulatory complications: Secondary | ICD-10-CM

## 2020-05-05 MED ORDER — TORSEMIDE 10 MG PO TABS: 10.0000 mg | ORAL_TABLET | Freq: Every day | ORAL | 1 refills | Status: DC | PRN

## 2020-05-05 MED ORDER — POTASSIUM CHLORIDE CRYS ER 10 MEQ PO TBCR: 10.0000 meq | EXTENDED_RELEASE_TABLET | Freq: Every day | ORAL | 1 refills | Status: DC

## 2020-05-05 MED ORDER — AMLODIPINE BESYLATE 5 MG PO TABS: 5.0000 mg | ORAL_TABLET | Freq: Every day | ORAL | 1 refills | Status: DC

## 2020-05-05 NOTE — Progress Notes (Signed)
   SUBJECTIVE:   CHIEF COMPLAINT / HPI:   Hypertension: - Medications: Patient recently with amlodipine increased to 5 mg.  Denies any symptoms of low blood pressure after increasing the medication. - Compliance: yes - Checking BP at home: no - Denies any SOB, CP, vision changes, LE edema, medication SEs, or symptoms of hypotension  PERTINENT  PMH / PSH: T1DM on insulin pump,Atrial fibrillation,Previous multiple CVAs,Hypothyroidism,HFpEF, HTN,Chronic back pain,GERD,Depression/anxiety, HLD  OBJECTIVE:  BP 138/70   Pulse 64   Ht 5\' 7"  (1.702 m)   Wt 241 lb (109.3 kg)   SpO2 99%   BMI 37.75 kg/m   General: NAD, pleasant, in wheelchair Neck: Supple Cardiovascular: RRR, no m/r/g, no LE edema Respiratory: CTA BL, normal work of breathing Psych: AOx3, appropriate affect  ASSESSMENT/PLAN:   Hypertension associated with diabetes ([place]) Patient with improved blood pressure on amlodipine 5 mg today as it 150/68.  Patient denies any symptoms of lower extremity swelling.  Systolic pressure still elevated but given diastolic of 68 will not continue to increase medication as patient is also a fall risk.  She is to continue with her other medications at this time and will follow up with me in 4 weeks.   [name] Marcella Dunnaway, DO PGY-3, [facility]

## 2020-05-05 NOTE — Patient Instructions (Signed)
Thank you for coming to see me today. It was a pleasure! Today we talked about:   I have refilled your medications and sent in 5 mg of amlodipine and you will only need to take one per day.   Please follow-up with me in 4 weeks or sooner as needed.  If you have any questions or concerns, please do not hesitate to call the office at [phone].  Take Care,   [name] Janari Yamada, DO

## 2020-05-06 LAB — BASIC METABOLIC PANEL
BUN/Creatinine Ratio: 17 (ref 12–28)
BUN: 16 mg/dL (ref 8–27)
CO2: 23 mmol/L (ref 20–29)
Calcium: 8.9 mg/dL (ref 8.7–10.3)
Chloride: 106 mmol/L (ref 96–106)
Creatinine, Ser: 0.95 mg/dL (ref 0.57–1.00)
GFR calc Af Amer: 71 mL/min/{1.73_m2} (ref >59)
GFR calc non Af Amer: 61 mL/min/{1.73_m2} (ref >59)
Glucose: 263 mg/dL — ABNORMAL HIGH (ref 65–99)
Potassium: 4.1 mmol/L (ref 3.5–5.2)
Sodium: 141 mmol/L (ref 134–144)

## 2020-05-06 LAB — LIPID PANEL
Chol/HDL Ratio: 2 ratio (ref 0.0–4.4)
Cholesterol, Total: 113 mg/dL (ref 100–199)
HDL: 57 mg/dL (ref >39)
LDL Chol Calc (NIH): 46 mg/dL (ref 0–99)
Triglycerides: 35 mg/dL (ref 0–149)
VLDL Cholesterol Cal: 10 mg/dL (ref 5–40)

## 2020-05-09 DIAGNOSIS — G4733 Obstructive sleep apnea (adult) (pediatric): Secondary | ICD-10-CM | POA: Diagnosis not present

## 2020-05-10 NOTE — Assessment & Plan Note (Signed)
Patient with improved blood pressure on amlodipine 5 mg today as it 150/68.  Patient denies any symptoms of lower extremity swelling.  Systolic pressure still elevated but given diastolic of 68 will not continue to increase medication as patient is also a fall risk.  She is to continue with her other medications at this time and will follow up with me in 4 weeks.

## 2020-05-17 ENCOUNTER — Other Ambulatory Visit (HOSPITAL_COMMUNITY): Payer: Self-pay | Admitting: *Deleted

## 2020-05-17 DIAGNOSIS — Z9641 Presence of insulin pump (external) (internal): Secondary | ICD-10-CM | POA: Diagnosis not present

## 2020-05-17 DIAGNOSIS — E11319 Type 2 diabetes mellitus with unspecified diabetic retinopathy without macular edema: Secondary | ICD-10-CM | POA: Diagnosis not present

## 2020-05-17 DIAGNOSIS — E890 Postprocedural hypothyroidism: Secondary | ICD-10-CM | POA: Diagnosis not present

## 2020-05-17 DIAGNOSIS — I639 Cerebral infarction, unspecified: Secondary | ICD-10-CM | POA: Diagnosis not present

## 2020-05-17 DIAGNOSIS — I10 Essential (primary) hypertension: Secondary | ICD-10-CM | POA: Diagnosis not present

## 2020-05-17 DIAGNOSIS — Z794 Long term (current) use of insulin: Secondary | ICD-10-CM | POA: Diagnosis not present

## 2020-05-17 DIAGNOSIS — C73 Malignant neoplasm of thyroid gland: Secondary | ICD-10-CM | POA: Diagnosis not present

## 2020-05-17 DIAGNOSIS — E109 Type 1 diabetes mellitus without complications: Secondary | ICD-10-CM | POA: Diagnosis not present

## 2020-05-17 DIAGNOSIS — E7800 Pure hypercholesterolemia, unspecified: Secondary | ICD-10-CM | POA: Diagnosis not present

## 2020-05-19 DIAGNOSIS — E1065 Type 1 diabetes mellitus with hyperglycemia: Secondary | ICD-10-CM | POA: Diagnosis not present

## 2020-05-25 ENCOUNTER — Telehealth: Payer: Self-pay

## 2020-05-25 DIAGNOSIS — R296 Repeated falls: Secondary | ICD-10-CM | POA: Diagnosis not present

## 2020-05-25 NOTE — Telephone Encounter (Signed)
Hello Page,  Please let the patient know that according to our policy, we don't switch PCP from residents to attending and vise versa unless expressly requested by the attending for good reasons. Since Dr. Owens Shark has not approved it, I agree with disapproval as well.  Let me know if you have any questions.

## 2020-05-25 NOTE — Telephone Encounter (Signed)
Patient calls nurse line stating she knows PCP is about to graduate and would like to make a recommendation on who will be her new PCP. Patient is requesting Owens Shark. Patient states that her son, Emerson Cisar, and Ashay Barrere (unsure of relation) both see Owens Shark. I advised her that it is not typical practice to switch a resident patient to a faculty patient, however I will see what I can do. Will forward to current PCP and Owens Shark.

## 2020-05-28 ENCOUNTER — Other Ambulatory Visit: Payer: Self-pay | Admitting: Family Medicine

## 2020-05-28 LAB — CUP PACEART REMOTE DEVICE CHECK
Date Time Interrogation Session: 20210603231329
Implantable Pulse Generator Implant Date: 20191230

## 2020-05-31 ENCOUNTER — Other Ambulatory Visit (HOSPITAL_COMMUNITY): Payer: Self-pay | Admitting: *Deleted

## 2020-05-31 ENCOUNTER — Other Ambulatory Visit: Payer: Self-pay | Admitting: Family Medicine

## 2020-05-31 MED ORDER — APIXABAN 5 MG PO TABS: 5.0000 mg | ORAL_TABLET | Freq: Two times a day (BID) | ORAL | 2 refills | Status: DC

## 2020-06-02 ENCOUNTER — Other Ambulatory Visit: Payer: Self-pay

## 2020-06-02 ENCOUNTER — Telehealth: Payer: Self-pay | Admitting: Family Medicine

## 2020-06-02 ENCOUNTER — Ambulatory Visit: Payer: Medicare PPO | Admitting: Family Medicine

## 2020-06-02 ENCOUNTER — Encounter: Payer: Self-pay | Admitting: Family Medicine

## 2020-06-02 VITALS — BP 128/82 | HR 71 | Wt 240.0 lb

## 2020-06-02 DIAGNOSIS — E1059 Type 1 diabetes mellitus with other circulatory complications: Secondary | ICD-10-CM | POA: Diagnosis not present

## 2020-06-02 DIAGNOSIS — E1159 Type 2 diabetes mellitus with other circulatory complications: Secondary | ICD-10-CM

## 2020-06-02 DIAGNOSIS — I10 Essential (primary) hypertension: Secondary | ICD-10-CM

## 2020-06-02 LAB — POCT GLYCOSYLATED HEMOGLOBIN (HGB A1C): HbA1c, POC (controlled diabetic range): 8.4 % — AB (ref 0.0–7.0)

## 2020-06-02 MED ORDER — LOPERAMIDE HCL 2 MG PO TABS: 2.0000 mg | ORAL_TABLET | Freq: Four times a day (QID) | ORAL | 2 refills | Status: DC | PRN

## 2020-06-02 NOTE — Progress Notes (Signed)
   SUBJECTIVE:   CHIEF COMPLAINT / HPI:   Diabetes: Patient reports that she has an endocrinology appointment in July and they have been managing her insulin pump.  She denies any hypoglycemic episodes.Denies dizziness, diaphoresis, LOC, polyuria, polydipsia.  Hypertension: - Medications: Losartan 100 mg, amlodipine 5 mg - Compliance: yes - Checking BP at home: no - Denies any SOB, CP, vision changes, LE edema, medication SEs, or symptoms of hypotension   PERTINENT  PMH / PSH: T1DM on insulin pump,Atrial fibrillation,Previous multiple CVAs,Hypothyroidism,HFpEF, HTN,Chronic back pain,GERD,Depression/anxiety, HLD  OBJECTIVE:  BP 128/82   Pulse 71   Wt 240 lb (108.9 kg)   SpO2 97%   BMI 37.59 kg/m   General: NAD, pleasant Neck: Supple Respiratory:  normal work of breathing Psych: AOx3, appropriate affect  ASSESSMENT/PLAN:   Hypertension associated with diabetes (HCC) BP at goal today at 128/82.  Continue current management with losartan 100 mg and amlodipine 5 mg.  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  Currently managed by insulin pump    [name] Roylene Heaton, DO PGY-3, [place]

## 2020-06-02 NOTE — Telephone Encounter (Addendum)
Form completed and placed upfront for patient to sign.  Please copy into chart and then fax after it has been completed.

## 2020-06-02 NOTE — Patient Instructions (Signed)
Thank you for coming to see me today. It was a pleasure! Today we talked about:   I'm glad your blood pressure is doing better. Continue your current medications.  Please follow-up with me on 6/29 at 11:10 am or sooner as needed.  If you have any questions or concerns, please do not hesitate to call the office at [phone].  Take Care,   [name] Vondell Babers, DO

## 2020-06-02 NOTE — Telephone Encounter (Signed)
Pt called about her SCAT application wanting to know if the doctor has received it.

## 2020-06-02 NOTE — Telephone Encounter (Signed)
Checked provider box and form has not been received yet.  Will let provider know that form should be in her box soon.  Replied to patient's mychart to let her know also.  Mirza Kidney,CMA

## 2020-06-04 ENCOUNTER — Encounter: Payer: Self-pay | Admitting: Podiatry

## 2020-06-04 ENCOUNTER — Other Ambulatory Visit: Payer: Self-pay

## 2020-06-04 ENCOUNTER — Ambulatory Visit: Payer: Medicare PPO | Admitting: Podiatry

## 2020-06-04 DIAGNOSIS — D689 Coagulation defect, unspecified: Secondary | ICD-10-CM

## 2020-06-04 DIAGNOSIS — E1159 Type 2 diabetes mellitus with other circulatory complications: Secondary | ICD-10-CM | POA: Diagnosis not present

## 2020-06-04 DIAGNOSIS — M79674 Pain in right toe(s): Secondary | ICD-10-CM | POA: Diagnosis not present

## 2020-06-04 DIAGNOSIS — B351 Tinea unguium: Secondary | ICD-10-CM

## 2020-06-04 DIAGNOSIS — M79675 Pain in left toe(s): Secondary | ICD-10-CM | POA: Diagnosis not present

## 2020-06-04 NOTE — Progress Notes (Signed)
This patient returns to my office for at risk foot care.  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  This patient is unable to cut nails herself  since the patient cannot reach her  nails.These nails are painful walking and wearing shoes.  Patient presents in a wheelchair with her sister.  This patient presents for at risk foot care today.  General Appearance  Alert, conversant and in no acute stress.  Vascular  Dorsalis pedis and posterior tibial  pulses are weakly  palpable  bilaterally.  Capillary return is within normal limits  bilaterally. Temperature is within normal limits  bilaterally.  Neurologic  Senn-Weinstein monofilament wire diminished  bilaterally. Muscle power within normal limits bilaterally.  Nails Thick disfigured discolored nails with subungual debris  from hallux to fifth toes bilaterally. No evidence of bacterial infection or drainage bilaterally.  Orthopedic  No limitations of motion  feet .  No crepitus or effusions noted.  No bony pathology or digital deformities noted.  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  Hammer toes 2-5  B/L.  Skin  normotropic skin with no porokeratosis noted bilaterally.  No signs of infections or ulcers noted.     Onychomycosis  Pain in right toes  Pain in left toes  Consent was obtained for treatment procedures.   Mechanical debridement of nails 1-5  bilaterally performed with a nail nipper.  Filed with dremel without incident. No infection or ulcer.     Return office visit    3 months       Told patient to return for periodic foot care and evaluation due to potential at risk complications.   Gardiner Barefoot DPM

## 2020-06-07 ENCOUNTER — Encounter: Payer: Self-pay | Admitting: Family Medicine

## 2020-06-08 NOTE — Assessment & Plan Note (Signed)
BP at goal today at 128/82.  Continue current management with losartan 100 mg and amlodipine 5 mg.

## 2020-06-08 NOTE — Assessment & Plan Note (Signed)
Patient has follow-up with endocrinology in July.  Currently managed by insulin pump

## 2020-06-09 DIAGNOSIS — G4733 Obstructive sleep apnea (adult) (pediatric): Secondary | ICD-10-CM | POA: Diagnosis not present

## 2020-06-14 ENCOUNTER — Telehealth: Payer: Self-pay

## 2020-06-14 ENCOUNTER — Telehealth: Payer: Self-pay | Admitting: Cardiology

## 2020-06-14 ENCOUNTER — Other Ambulatory Visit (HOSPITAL_COMMUNITY): Payer: Self-pay | Admitting: *Deleted

## 2020-06-14 MED ORDER — APIXABAN 5 MG PO TABS: 5.0000 mg | ORAL_TABLET | Freq: Two times a day (BID) | ORAL | 0 refills | Status: DC

## 2020-06-14 NOTE — Telephone Encounter (Signed)
Carelink alert received 06/13/20 for Pause alert 06/12/20 06:36, EGM shows 3 second pause. No symptom event.   Called patient to assess, patient is unsure if she was sleeping or awake. Patient reports she wakes frequently d/t c-pap. Patient denies any complaints and states she has felt fine. Patient has follow-up with Dr. Curt Bears 06/15/20 at 2:30, patient reminded of apt.

## 2020-06-14 NOTE — Telephone Encounter (Signed)
° ° °  Went to chart to check meds list, she needs to speak Dr. Marlene Lard about meds. Transferred call to afib clinic

## 2020-06-15 ENCOUNTER — Other Ambulatory Visit: Payer: Self-pay

## 2020-06-15 ENCOUNTER — Ambulatory Visit: Payer: Medicare PPO | Admitting: Cardiology

## 2020-06-15 ENCOUNTER — Encounter: Payer: Self-pay | Admitting: Cardiology

## 2020-06-15 VITALS — BP 140/58 | HR 46 | Ht 67.0 in | Wt 239.0 lb

## 2020-06-15 DIAGNOSIS — I639 Cerebral infarction, unspecified: Secondary | ICD-10-CM

## 2020-06-15 LAB — CUP PACEART INCLINIC DEVICE CHECK
Date Time Interrogation Session: 20210622134400
Implantable Pulse Generator Implant Date: 20191230

## 2020-06-15 NOTE — Patient Instructions (Addendum)
Medication Instructions:  Your physician recommends that you continue on your current medications as directed. Please refer to the Current Medication list given to you today.  *If you need a refill on your cardiac medications before your next appointment, please call your pharmacy*   Lab Work: None ordered   Testing/Procedures: Your physician has recommended that you have a pacemaker inserted (after taking out your loop recorder). A pacemaker is a small device that is placed under the skin of your chest or abdomen to help control abnormal heart rhythms. This device uses electrical pulses to prompt the heart to beat at a normal rate. Pacemakers are used to treat heart rhythms that are too slow. Wire (leads) are attached to the pacemaker that goes into the chambers of you heart. This is done in the hospital and usually requires and overnight stay. Please see the instruction sheet given to you today for more information.   Follow-Up: At [facility], you and your health needs are our priority.  As part of our continuing mission to provide you with exceptional heart care, we have created designated Provider Care Teams.  These Care Teams include your primary Cardiologist (physician) and Advanced Practice Providers (APPs -  Physician Assistants and Nurse Practitioners) who all work together to provide you with the care you need, when you need it.  We recommend signing up for the patient portal called "MyChart".  Sign up information is provided on this After Visit Summary.  MyChart is used to connect with patients for Virtual Visits (Telemedicine).  Patients are able to view lab/test results, encounter notes, upcoming appointments, etc.  Non-urgent messages can be sent to your provider as well.   To learn more about what you can do with MyChart, go to [email or web address].    Your next appointment:   10-14 day(s) after your pacemaker implant  The format for your next appointment:   In  Person  Provider:   device clinic for a wound check   Thank you for choosing [facility]!!   Trinidad Curet, RN [phone]    Other Instructions   Implantable Device Instructions  You are scheduled for: Loop recorder explant & Permanent pacemaker implant on 06/25/2020 with Dr. Curt Bears.  1.   On the day of your procedure 06/25/20 you will go to [facility] hospital ([address]) at 10:30 am.  Dennis Bast will go to the main entrance A [employer]) and enter where the [employer] are.  You will check in at ADMITTING.  You may have one support person come in to the hospital with you.  They will be asked to wait in the waiting room.   2.   Do not eat or drink after midnight prior to your procedure.   3.   On the morning of your procedure do NOT take any medication.  4.  The night before your procedure and the morning of your procedure scrub your neck/chest with surgical scrub.  An instruction letter is included below.   5.  Plan for an overnight stay.  If you use your phone frequently bring your phone charger.  When you are discharged you will need someone to drive you home.   6.  You will follow up with the [facility] 10-14 days after your procedure. You will follow up with Dr. Curt Bears 91 days after your procedure.  These appointments will be made for you.   * If you have ANY questions after you get home, please  call the office [phone] and ask for Ariani Seier RN or send a MyChart message.   [place] - Preparing For Surgery  Before surgery, you can play an important role. Because skin is not sterile, your skin needs to be as free of germs as possible. You can reduce the number of germs on your skin by washing with CHG (chlorahexidine gluconate) Soap before surgery.  CHG is an antiseptic cleaner which kills germs and bonds with the skin to continue killing germs even after washing.   Please do not use if you have an allergy to CHG or antibacterial soaps.   If your skin becomes reddened/irritated stop using the CHG.   Do not shave (including legs and underarms) for at least 48 hours prior to first CHG shower.  It is OK to shave your face.  Please follow these instructions carefully:  1.  Shower the night before surgery and the morning of surgery with CHG.  2.  If you choose to wash your hair, wash your hair first as usual with your normal shampoo.  3.  After you shampoo, rinse your hair and body thoroughly to remove the shampoo.  4.  Use CHG as you would any other liquid soap.  You can apply CHG directly to the skin and wash gently with a clean washcloth. 5.  Apply the CHG Soap to your body ONLY FROM THE NECK DOWN.  Do not use on open wounds or open sores.  Avoid contact with your eyes, ears, mouth and genitals (private parts).  Wash genitals (private parts) with your normal soap.  6.  Wash thoroughly, paying special attention to the area where your surgery will be performed.  7.  Thoroughly rinse your body with warm water from the neck down.   8.  DO NOT shower/wash with your normal soap after using and rinsing off the CHG soap.  9.  Pat yourself dry with a clean towel.           10.  Wear clean pajamas.           11.  Place clean sheets on your bed the night of your first shower and do not sleep with pets.  Day of Surgery: Do not apply any deodorants/lotions.  Please wear clean clothes to the hospital/surgery center.    Pacemaker Implantation, Adult Pacemaker implantation is a procedure to place a pacemaker inside your chest. A pacemaker is a small computer that sends electrical signals to the heart and helps your heart beat normally. A pacemaker also stores information about your heart rhythms. You may need pacemaker implantation if you:  Have a slow heartbeat (bradycardia).  Faint (syncope).  Have shortness of breath (dyspnea) due to heart problems. The pacemaker attaches to your heart through a wire, called a lead. Sometimes just one  lead is needed. Other times, there will be two leads. There are two types of pacemakers:  Transvenous pacemaker. This type is placed under the skin or muscle of your chest. The lead goes through a vein in the chest area to reach the inside of the heart.  Epicardial pacemaker. This type is placed under the skin or muscle of your chest or belly. The lead goes through your chest to the outside of the heart. Tell a health care provider about:  Any allergies you have.  All medicines you are taking, including vitamins, herbs, eye drops, creams, and over-the-counter medicines.  Any problems you or family members have had with anesthetic medicines.  Any  blood or bone disorders you have.  Any surgeries you have had.  Any medical conditions you have.  Whether you are pregnant or may be pregnant. What are the risks? Generally, this is a safe procedure. However, problems may occur, including:  Infection.  Bleeding.  Failure of the pacemaker or the lead.  Collapse of a lung or bleeding into a lung.  Blood clot inside a blood vessel with a lead.  Damage to the heart.  Infection inside the heart (endocarditis).  Allergic reactions to medicines. What happens before the procedure? Staying hydrated Follow instructions from your health care provider about hydration, which may include:  Up to 2 hours before the procedure - you may continue to drink clear liquids, such as water, clear fruit juice, black coffee, and plain tea. Eating and drinking restrictions Follow instructions from your health care provider about eating and drinking, which may include:  8 hours before the procedure - stop eating heavy meals or foods such as meat, fried foods, or fatty foods.  6 hours before the procedure - stop eating light meals or foods, such as toast or cereal.  6 hours before the procedure - stop drinking milk or drinks that contain milk.  2 hours before the procedure - stop drinking clear  liquids. Medicines  Ask your health care provider about: ? Changing or stopping your regular medicines. This is especially important if you are taking diabetes medicines or blood thinners. ? Taking medicines such as aspirin and ibuprofen. These medicines can thin your blood. Do not take these medicines before your procedure if your health care provider instructs you not to.  You may be given antibiotic medicine to help prevent infection. General instructions  You will have a heart evaluation. This may include an electrocardiogram (ECG), chest X-ray, and heart imaging (echocardiogram,  or echo) tests.  You will have blood tests.  Do not use any products that contain nicotine or tobacco, such as cigarettes and e-cigarettes. If you need help quitting, ask your health care provider.  Plan to have someone take you home from the hospital or clinic.  If you will be going home right after the procedure, plan to have someone with you for 24 hours.  Ask your health care provider how your surgical site will be marked or identified. What happens during the procedure?  To reduce your risk of infection: ? Your health care team will wash or sanitize their hands. ? Your skin will be washed with soap. ? Hair may be removed from the surgical area.  An IV tube will be inserted into one of your veins.  You will be given one or more of the following: ? A medicine to help you relax (sedative). ? A medicine to numb the area (local anesthetic). ? A medicine to make you fall asleep (general anesthetic).  If you are getting a transvenous pacemaker: ? An incision will be made in your upper chest. ? A pocket will be made for the pacemaker. It may be placed under the skin or between layers of muscle. ? The lead will be inserted into a blood vessel that returns to the heart. ? While X-rays are taken by an imaging machine (fluoroscopy), the lead will be advanced through the vein to the inside of your  heart. ? The other end of the lead will be tunneled under the skin and attached to the pacemaker.  If you are getting an epicardial pacemaker: ? An incision will be made near your ribs or  breastbone (sternum) for the lead. ? The lead will be attached to the outside of your heart. ? Another incision will be made in your chest or upper belly to create a pocket for the pacemaker. ? The free end of the lead will be tunneled under the skin and attached to the pacemaker.  The transvenous or epicardial pacemaker will be tested. Imaging studies may be done to check the lead position.  The incisions will be closed with stitches (sutures), adhesive strips, or skin glue.  Bandages (dressing) will be placed over the incisions. The procedure may vary among health care providers and hospitals. What happens after the procedure?  Your blood pressure, heart rate, breathing rate, and blood oxygen level will be monitored until the medicines you were given have worn off.  You will be given antibiotics and pain medicine.  ECG and chest x-rays will be done.  You will wear a continuous type of ECG (Holter monitor) to check your heart rhythm.  Your health care provider will program the pacemaker.  Do not drive for 24 hours if you received a sedative. This information is not intended to replace advice given to you by your health care provider. Make sure you discuss any questions you have with your health care provider. Document Revised: 08/30/2018 Document Reviewed: 05/24/2016 Elsevier Patient Education  [place].

## 2020-06-15 NOTE — Progress Notes (Signed)
Electrophysiology Office Note   Date:  06/15/2020   ID:  [name] Khala Tarte, DOB [birth year], MRN [id number]  PCP:  Shirley, [name], DO  Cardiologist:  Marlou Porch Primary Electrophysiologist:  Dr Curt Bears    CC: Follow up for cryptogenic stroke   History of Present Illness: [name] is a 69 y.o. female who is being seen today for the evaluation of PACs at the request of Shirley, [name], DO. Presenting today for electrophysiology evaluation.  She has a history of hypertension, type 1 diabetes, hyperlipidemia, thyroid cancer status post thyroidectomy, prior CVA in 2016, and GERD.  She was admitted to the hospital with left-sided weakness and diagnosed with an acute stroke on 10/13/18.  On telemetry, she was found to have frequent atrial ectopy.  This was not thought to be due to atrial fibrillation.  Today, denies symptoms of palpitations, chest pain, shortness of breath, orthopnea, PND, lower extremity edema, claudication, dizziness, presyncope, syncope, bleeding, or neurologic sequela. The patient is tolerating medications without difficulties.  She was having significant bradycardia with 2-1 AV block as well as complete heart block.  Her metoprolol was stopped due to that.  She overall feels well, though she does state that she continues to have weakness and fatigue.  Her heart rates have been slow, mainly in the 40s to 50s.  ECG today shows intermittent complete heart block.   Past Medical History:  Diagnosis Date  . Abnormal echocardiogram   . Anemia   . Arthritis   . BPPV (benign paroxysmal positional vertigo), right 06/13/2017  . Cerebrovascular accident (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 ([place]) 10/29/2015  . CHF (congestive heart failure) ([place])   . Closed fracture of left proximal humerus 07/08/2019  . Depression   . Dizzy 03/07/2020   bradycardia  . GERD (gastroesophageal reflux disease)   . Hemiparesis affecting left side as late effect of  cerebrovascular accident (CVA) ([place]) 03/08/2020  . Hemorrhoids   . History of Falls with injury 03/08/2020   Fall resulting in left humeral fracture  . Hyperlipidemia   . Hypertension   . Hypothyroidism   . Intracranial vascular stenosis   . Seasonal allergies   . Stroke ([place])   . Thyroid cancer ([address])    per pt S/p Total Thyroidectomy with Radioactive Iodine Therapy  . Type 1 diabetes mellitus ([place])    Past Surgical History:  Procedure Laterality Date  . ABDOMINAL ANGIOGRAM N/A 05/14/2012   Procedure: ABDOMINAL ANGIOGRAM;  Surgeon: Laverda Page, MD;  Location: [facility] CATH LAB;  Service: Cardiovascular;  Laterality: N/A;  . ABDOMINAL HYSTERECTOMY  1995   partial  . ANKLE FRACTURE SURGERY Right   . CARDIAC CATHETERIZATION     with coronary angiogram  . COLONOSCOPY N/A 09/09/2014   Procedure: COLONOSCOPY;  Surgeon: Gatha Mayer, MD;  Location: [place];  Service: Endoscopy;  Laterality: N/A;  . ESOPHAGOGASTRODUODENOSCOPY (EGD) WITH PROPOFOL N/A 12/03/2018   Procedure: ESOPHAGOGASTRODUODENOSCOPY (EGD) WITH PROPOFOL;  Surgeon: Doran Stabler, MD;  Location: [place];  Service: Gastroenterology;  Laterality: N/A;  . LEFT AND RIGHT HEART CATHETERIZATION WITH CORONARY ANGIOGRAM N/A 05/14/2012   Procedure: LEFT AND RIGHT HEART CATHETERIZATION WITH CORONARY ANGIOGRAM;  Surgeon: Laverda Page, MD;  Location: [facility] CATH LAB;  Service: Cardiovascular;  Laterality: N/A;  . LOOP RECORDER INSERTION N/A 12/23/2018   Procedure: LOOP RECORDER INSERTION;  Surgeon: Constance Haw, MD;  Location: [place] CV LAB;  Service: Cardiovascular;  Laterality: N/A;  . MINI METER  09/08/2014   ELECTRONIC INSULIN PUMP  . TEE WITHOUT CARDIOVERSION N/A 10/16/2018   Procedure: TRANSESOPHAGEAL ECHOCARDIOGRAM (TEE);  Surgeon: Buford Dresser, MD;  Location: [facility] ENDOSCOPY;  Service: Cardiovascular;  Laterality: N/A;  . THYROIDECTOMY  2006     Current Outpatient Medications  Medication Sig  Dispense Refill  . ACCU-CHEK GUIDE test strip     . acetaminophen (TYLENOL) 500 MG tablet Take 1,000 mg by mouth as needed for mild pain.    Marland Kitchen amLODipine (NORVASC) 5 MG tablet Take 1 tablet (5 mg total) by mouth daily. 90 tablet 1  . apixaban (ELIQUIS) 5 MG TABS tablet Take 1 tablet (5 mg total) by mouth 2 (two) times daily. 180 tablet 0  . atorvastatin (LIPITOR) 80 MG tablet Take 1 tablet (80 mg total) by mouth every evening. 90 tablet 2  . Blood Glucose Monitoring Suppl (ACCU-CHEK GUIDE) w/Device KIT     . CRANBERRY EXTRACT PO Take 1 capsule by mouth daily.    . fexofenadine (ALLEGRA) 30 MG tablet Take 30 mg by mouth daily as needed (allergies).     Marland Kitchen FLUoxetine (PROZAC) 10 MG capsule     . gabapentin (NEURONTIN) 300 MG capsule TAKE ONE CAPSULE BY MOUTH AT BEDTIME 90 capsule 3  . HUMALOG 100 UNIT/ML injection     . insulin aspart (NOVOLOG) 100 UNIT/ML injection To be used in patient's insulin pump 30 mL 0  . Insulin Human (INSULIN PUMP) SOLN Inject 1 each into the skin continuous. Novolog insulin 1 each 0  . levothyroxine (SYNTHROID, LEVOTHROID) 175 MCG tablet Take 175 mcg by mouth daily before breakfast.   12  . loperamide (IMODIUM A-D) 2 MG tablet Take 1 tablet (2 mg total) by mouth 4 (four) times daily as needed for diarrhea or loose stools. 30 tablet 2  . losartan (COZAAR) 100 MG tablet TAKE 1 TABLET BY MOUTH DAILY 90 tablet 3  . pantoprazole (PROTONIX) 40 MG tablet TAKE 1 TABLET BY MOUTH TWICE DAILY BEFORE MEALS 180 tablet 1  . potassium chloride (KLOR-CON) 10 MEQ tablet Take 1 tablet (10 mEq total) by mouth daily. 90 tablet 1  . tobramycin (TOBREX) 0.3 % ophthalmic solution      No current facility-administered medications for this visit.    Allergies:   Latex   Social History:  The patient  reports that she has never smoked. She has never used smokeless tobacco. She reports that she does not drink alcohol and does not use drugs.   Family History:  The patient's family history  includes Diabetes in her maternal aunt; Early death in her father; Heart attack in her maternal grandfather and mother; Heart disease in her maternal aunt, maternal grandfather, and mother; Heart failure in her maternal grandfather; Hyperlipidemia in her father and mother; Hypertension in her father and mother; Prostate cancer in her paternal uncle; Renal Disease in her mother; Stroke in her son.    ROS:  Please see the history of present illness.   Otherwise, review of systems is positive for none.   All other systems are reviewed and negative.   PHYSICAL EXAM: VS:  BP (!) 140/58   Pulse (!) 46   Ht _0  (1.702 m)   Wt 239 lb (108.4 kg)   SpO2 97%   BMI 37.43 kg/m  , BMI Body mass index is 37.43 kg/m. GEN: Well nourished, well developed, in no acute distress  HEENT: normal  Neck: no JVD, carotid bruits, or masses Cardiac: Bradycardic, irregular; no murmurs, rubs, or  gallops,no edema  Respiratory:  clear to auscultation bilaterally, normal work of breathing GI: soft, nontender, nondistended, + BS MS: no deformity or atrophy  Skin: warm and dry, device site well healed Neuro:  Strength and sensation are intact Psych: euthymic mood, full affect  EKG:  EKG is ordered today. Personal review of the ekg ordered shows sinus rhythm, complete heart block with intermittent conduction , junctional escape   Personal review of the device interrogation today. Results in [place]: 03/07/2020: TSH 2.978 03/08/2020: ALT 39 03/13/2020: Hemoglobin 11.8; Platelets 198 03/14/2020: Magnesium 1.6 05/05/2020: BUN 16; Creatinine, Ser 0.95; Potassium 4.1; Sodium 141    Lipid Panel     Component Value Date/Time   CHOL 113 05/05/2020 1125   TRIG 35 05/05/2020 1125   HDL 57 05/05/2020 1125   CHOLHDL 2.0 05/05/2020 1125   CHOLHDL 2.2 10/14/2018 0439   VLDL 11 10/14/2018 0439   LDLCALC 46 05/05/2020 1125   LDLDIRECT 75 01/25/2012 1508     Wt Readings from Last 3 Encounters:  06/15/20  239 lb (108.4 kg)  06/02/20 240 lb (108.9 kg)  05/05/20 241 lb (109.3 kg)      Other studies Reviewed: Additional studies/ records that were reviewed today include: TTE 10/14/18  Review of the above records today demonstrates:  - Left ventricle: The cavity size was normal. There was moderate   concentric hypertrophy. Systolic function was normal. The   estimated ejection fraction was in the range of 60% to 65%. Wall   motion was normal; there were no regional wall motion   abnormalities. The study is not technically sufficient to allow   evaluation of LV diastolic function. - Left atrium: The atrium was normal in size. - Right atrium: The atrium was normal in size.  Cardiac monitor 10/28/18 - personally reviewed PAC's, 1 episodes NSVT 5 beats, no atrial fibrillation  ASSESSMENT AND PLAN:  1.  Cardiogenic stroke: Has had 2 strokes in 2016 and in 2019.  Status post Linq monitor implant.    2.  Hypertension: Mildly elevated today.  This may improve post pacemaker implant.  3.  Hyperlipidemia: Continue statin  4.  Complete heart block: Has a narrow escape likely is stable.  She has symptoms of weakness and fatigue.  She would thus benefit from pacemaker implant.  Risks and benefits were discussed which include bleeding, tamponade, infection, pneumothorax.  She understands these risks and has agreed to the procedure.  We Rui Wordell plan for Linq monitor explant at that time.  Current medicines are reviewed at length with the patient today.   The patient does not have concerns regarding her medicines.  The following changes were made today: None  Labs/ tests ordered today include:  Orders Placed This Encounter  Procedures  . CUP PACEART [place]  . EKG 12-Lead    Disposition: 3 months  Signed, Shanley Furlough Meredith Leeds, MD  06/15/2020 3:30 PM     [address] [place] [address] [phone] (office) [phone] (fax)

## 2020-06-16 ENCOUNTER — Telehealth: Payer: Self-pay

## 2020-06-16 ENCOUNTER — Telehealth: Payer: Self-pay | Admitting: *Deleted

## 2020-06-16 ENCOUNTER — Telehealth: Payer: Self-pay | Admitting: Family Medicine

## 2020-06-16 NOTE — Telephone Encounter (Signed)
Follow Up:.    Pt said you and her had been communicating via My Chart this morning, She would like for you to give her a call please.

## 2020-06-16 NOTE — Telephone Encounter (Signed)
Patient is calling and would like to speak with Neoma Laming concerning issues with her transportation.   The best call back number is [phone]

## 2020-06-16 NOTE — Telephone Encounter (Signed)
Carelink alert received for Pause event, no details available.  Need manual transmission.  Spoke with pt, she needs her grandaughters assistance to operate bedside monitor.  She will send transmission when he grandaughter wakes up.

## 2020-06-16 NOTE — Telephone Encounter (Signed)
Manual transmission received.  Details of pause events filled in.    Spoke with pt, she reports she was awake at time of 6/22 event but denies any particular symptoms at that time.  She continues to have a heaviness in her chest and is scheduled for PPM implant on 7/2.    Pt asked if the implant sx needs to be sooner, advised that I did not think so.

## 2020-06-16 NOTE — Telephone Encounter (Signed)
Patient calls because she was looking at her mychart and some of her meds are not showing up.  She would like this fixed before she has her pacemaker inserted.  Furosemide 10mg  - PRN Prozac 20mg  - daily (pt reports that she takes a 10mg  and a 20mg ) ondansetron 4mg  - PRN  Christen Bame, CMA

## 2020-06-16 NOTE — Telephone Encounter (Signed)
Per pt has been taking Torsemide 10 mg every other day along with K supple same time Pt asking if should continue Will forward to Dr Curt Bears for rerview .Adonis Housekeeper

## 2020-06-16 NOTE — Telephone Encounter (Signed)
See phone other  note ./cy

## 2020-06-17 ENCOUNTER — Telehealth: Payer: Self-pay | Admitting: Cardiology

## 2020-06-17 ENCOUNTER — Ambulatory Visit: Payer: Medicare PPO | Admitting: Licensed Clinical Social Worker

## 2020-06-17 ENCOUNTER — Encounter: Payer: Self-pay | Admitting: Family Medicine

## 2020-06-17 ENCOUNTER — Other Ambulatory Visit: Payer: Self-pay | Admitting: Family Medicine

## 2020-06-17 DIAGNOSIS — Z7189 Other specified counseling: Secondary | ICD-10-CM

## 2020-06-17 DIAGNOSIS — W19XXXS Unspecified fall, sequela: Secondary | ICD-10-CM

## 2020-06-17 DIAGNOSIS — Z139 Encounter for screening, unspecified: Secondary | ICD-10-CM

## 2020-06-17 NOTE — Chronic Care Management (AMB) (Signed)
Care Management   Clinical Social Work initial Note  06/17/2020 Name: [name] MRN: [id number] DOB: 21-Jun-1951 [name] [name] is a 69 y.o. year old female who sees Enid Derry, [name], DO for primary care. The Care Management team was consulted by [place] office staff to assist the patient with Transportation barriers .  LCSW reached out to [facility] today by phone to introduce self, assess needs and barriers to care.    Assessment: Patient is pleasant and engaged in conversation.  Experiencing  difficulty with resolving transportation issue which seems to be creating stress for her.        Plan: LCSW will F/U with PCP and request from insurance company as well as reach out to patient with in 1 to 2 weeks. ( patient having procedure on July 2nd)  Review of patient status, including review of consultants reports, relevant laboratory and other test results, and collaboration with appropriate care team members and the patient's provider was performed as part of comprehensive patient evaluation and provision of chronic care management services.    Advance Directive Status:   Not addressed in this encounter SDOH (Social Determinants of Health) assessments performed: Yes:   SDOH Interventions     Most Recent Value  SDOH Interventions  Transportation Interventions SCAT (Specialized Community Area Transporation), Other (Comment)      ; Goals Addressed            This Visit's Progress   . Barriers with Transportation       CARE PLAN ENTRY (see longitudinal plan of care for additional care plan information)  Current Barriers:  . Patient that is wheelchair dependent needs transportation to medical appointments,  . Acknowledges deficits and needs support, education and care coordination in order to meet this unmet need  . Barriers with SCAT application . Needs PCP to complete pre auth from insurance provider  Clinical Goal(s):  Over the next 20 days,  patient will receive transportation and be able to safely  travel without fear of missing appointments Interventions: . Assessed patient needs, what she has used in the past and how currently meeting needs . Placed SCAT application in mailbox for PCP to complete, LCSW will fax once completed . Collaborated with PCP, [facility] RN clinic and [employer] rep. regarding patient needs . Assessed patient with calling [employer] to confirm with is needed for none emergency EMS transportation to medical appointments . Provided [employer] clinical intake phone number to RN clinic  to answer questions [phone];   ( fax number [phone]) Patient Self Care Activities: . Patient is unable to independently navigate getting DME without care coordination support  . Patient's family will transport her to next two appointments until other options are resolved. Initial goal documentation         Outpatient Encounter Medications as of 06/17/2020  Medication Sig Note  . ACCU-CHEK GUIDE test strip    . acetaminophen (TYLENOL) 500 MG tablet Take 1,000 mg by mouth every 8 (eight) hours as needed for mild pain.    Marland Kitchen amLODipine (NORVASC) 5 MG tablet Take 1 tablet (5 mg total) by mouth daily.   Marland Kitchen apixaban (ELIQUIS) 5 MG TABS tablet Take 1 tablet (5 mg total) by mouth 2 (two) times daily.   Marland Kitchen atorvastatin (LIPITOR) 80 MG tablet Take 1 tablet (80 mg total) by mouth every evening.   . Blood Glucose Monitoring Suppl (ACCU-CHEK GUIDE) w/Device KIT    . CRANBERRY EXTRACT PO Take 1 capsule by  mouth daily.   . fexofenadine (ALLEGRA) 180 MG tablet Take 180 mg by mouth daily as needed for allergies or rhinitis.   Marland Kitchen FLUoxetine (PROZAC) 10 MG capsule Take 10 mg by mouth daily. Take with the 20 mg dose to equal 30 mg daily   . FLUoxetine (PROZAC) 20 MG tablet Take 20 mg by mouth daily. Take with the 10 mg dose to equal 30 mg daily   . gabapentin (NEURONTIN) 300 MG capsule TAKE ONE CAPSULE BY MOUTH AT BEDTIME (Patient taking  differently: Take 300 mg by mouth at bedtime. )   . insulin aspart (NOVOLOG) 100 UNIT/ML injection To be used in patient's insulin pump (Patient taking differently: Inject 20-30 Units into the skin daily. To be used in patient's insulin pump) 06/16/2020: Pt states she gets anywhere from 20 to 30 units per day via her pump   . Insulin Human (INSULIN PUMP) SOLN Inject 1 each into the skin continuous. Novolog insulin   . levothyroxine (SYNTHROID, LEVOTHROID) 175 MCG tablet Take 175 mcg by mouth daily before breakfast.    . loperamide (IMODIUM A-D) 2 MG tablet Take 1 tablet (2 mg total) by mouth 4 (four) times daily as needed for diarrhea or loose stools.   Marland Kitchen losartan (COZAAR) 100 MG tablet TAKE 1 TABLET BY MOUTH DAILY (Patient taking differently: Take 100 mg by mouth daily. )   . metoprolol succinate (TOPROL-XL) 25 MG 24 hr tablet Take 25 mg by mouth every evening.   . Multiple Vitamins-Minerals (OCUVITE PO) Take 1 tablet by mouth daily.   . ondansetron (ZOFRAN-ODT) 4 MG disintegrating tablet Take 4 mg by mouth every 8 (eight) hours as needed for nausea or vomiting.   . pantoprazole (PROTONIX) 40 MG tablet TAKE 1 TABLET BY MOUTH TWICE DAILY BEFORE MEALS (Patient taking differently: Take 40 mg by mouth daily. )   . potassium chloride (KLOR-CON) 10 MEQ tablet Take 1 tablet (10 mEq total) by mouth daily.   Marland Kitchen torsemide (DEMADEX) 10 MG tablet Take 10 mg by mouth daily.   . [DISCONTINUED] losartan (COZAAR) 25 MG tablet Take 1 tablet (25 mg total) by mouth at bedtime.   . [DISCONTINUED] ondansetron (ZOFRAN-ODT) 4 MG disintegrating tablet DISSOLVE ONE TABLET BY MOUTH EVERY 8 HOURS AS NEEDED FOR NAUSEA AND VOMITING   . [DISCONTINUED] pantoprazole (PROTONIX) 40 MG tablet Take 1 tablet (40 mg total) by mouth daily.    No facility-administered encounter medications on file as of 06/17/2020.       Information about Care Management services was shared with Ms.  Goodgame today including:  1. Care Management services  include personalized support from designated clinical staff supervised by her physician, including individualized plan of care and coordination with other care providers 2. Remind patient of 24/7 contact phone numbers to provider's office for assistance with urgent and routine care needs. 3. Care Management services are voluntary and patient may stop at any time .  Patient agreed to services provided today and verbal consent obtained.     Casimer Lanius, [place] / [place]   [phone] 12:09 PM

## 2020-06-17 NOTE — Telephone Encounter (Signed)
Lm to call back ./cy 

## 2020-06-17 NOTE — Telephone Encounter (Signed)
Pt aware and agrees ./cy 

## 2020-06-17 NOTE — Telephone Encounter (Signed)
      Pt returning call from [place], transferred call

## 2020-06-17 NOTE — Telephone Encounter (Signed)
Yes would continue both torsemide and supplementation.

## 2020-06-21 NOTE — Telephone Encounter (Signed)
Patient has torsemide 10 mg to take daily as needed, not furosemide. We already have the Prozac 20 mg and 10 mg listed on her chart. We also already have the ondansetron 4 mg listed on her chart.

## 2020-06-22 ENCOUNTER — Ambulatory Visit: Payer: Medicare PPO | Admitting: Family Medicine

## 2020-06-23 ENCOUNTER — Telehealth: Payer: Self-pay | Admitting: Cardiology

## 2020-06-23 ENCOUNTER — Ambulatory Visit (HOSPITAL_COMMUNITY)
Admission: RE | Admit: 2020-06-23 | Discharge: 2020-06-23 | Disposition: A | Discharge status: Home / Self Care | Payer: Medicare PPO | Source: Ambulatory Visit | Attending: Physician Assistant | Admitting: Physician Assistant

## 2020-06-23 ENCOUNTER — Other Ambulatory Visit: Payer: Self-pay

## 2020-06-23 VITALS — BP 118/46 | HR 65 | Ht 67.0 in | Wt 239.2 lb

## 2020-06-23 DIAGNOSIS — Z6837 Body mass index (BMI) 37.0-37.9, adult: Secondary | ICD-10-CM | POA: Insufficient documentation

## 2020-06-23 DIAGNOSIS — Z8349 Family history of other endocrine, nutritional and metabolic diseases: Secondary | ICD-10-CM | POA: Diagnosis not present

## 2020-06-23 DIAGNOSIS — Z8249 Family history of ischemic heart disease and other diseases of the circulatory system: Secondary | ICD-10-CM | POA: Insufficient documentation

## 2020-06-23 DIAGNOSIS — Z79899 Other long term (current) drug therapy: Secondary | ICD-10-CM | POA: Insufficient documentation

## 2020-06-23 DIAGNOSIS — E785 Hyperlipidemia, unspecified: Secondary | ICD-10-CM | POA: Insufficient documentation

## 2020-06-23 DIAGNOSIS — Z85850 Personal history of malignant neoplasm of thyroid: Secondary | ICD-10-CM | POA: Insufficient documentation

## 2020-06-23 DIAGNOSIS — E039 Hypothyroidism, unspecified: Secondary | ICD-10-CM | POA: Insufficient documentation

## 2020-06-23 DIAGNOSIS — Z823 Family history of stroke: Secondary | ICD-10-CM | POA: Insufficient documentation

## 2020-06-23 DIAGNOSIS — I480 Paroxysmal atrial fibrillation: Secondary | ICD-10-CM | POA: Insufficient documentation

## 2020-06-23 DIAGNOSIS — Z794 Long term (current) use of insulin: Secondary | ICD-10-CM | POA: Insufficient documentation

## 2020-06-23 DIAGNOSIS — I69354 Hemiplegia and hemiparesis following cerebral infarction affecting left non-dominant side: Secondary | ICD-10-CM | POA: Insufficient documentation

## 2020-06-23 DIAGNOSIS — G4733 Obstructive sleep apnea (adult) (pediatric): Secondary | ICD-10-CM | POA: Insufficient documentation

## 2020-06-23 DIAGNOSIS — Z79890 Hormone replacement therapy (postmenopausal): Secondary | ICD-10-CM | POA: Insufficient documentation

## 2020-06-23 DIAGNOSIS — D6869 Other thrombophilia: Secondary | ICD-10-CM | POA: Diagnosis not present

## 2020-06-23 DIAGNOSIS — K219 Gastro-esophageal reflux disease without esophagitis: Secondary | ICD-10-CM | POA: Diagnosis not present

## 2020-06-23 DIAGNOSIS — I442 Atrioventricular block, complete: Secondary | ICD-10-CM | POA: Diagnosis not present

## 2020-06-23 DIAGNOSIS — E669 Obesity, unspecified: Secondary | ICD-10-CM | POA: Diagnosis not present

## 2020-06-23 DIAGNOSIS — Z7901 Long term (current) use of anticoagulants: Secondary | ICD-10-CM | POA: Diagnosis not present

## 2020-06-23 DIAGNOSIS — Z8781 Personal history of (healed) traumatic fracture: Secondary | ICD-10-CM | POA: Diagnosis not present

## 2020-06-23 DIAGNOSIS — E109 Type 1 diabetes mellitus without complications: Secondary | ICD-10-CM | POA: Insufficient documentation

## 2020-06-23 DIAGNOSIS — Z9989 Dependence on other enabling machines and devices: Secondary | ICD-10-CM | POA: Insufficient documentation

## 2020-06-23 DIAGNOSIS — I10 Essential (primary) hypertension: Secondary | ICD-10-CM | POA: Diagnosis not present

## 2020-06-23 MED ORDER — APIXABAN 5 MG PO TABS: 5.0000 mg | ORAL_TABLET | Freq: Two times a day (BID) | ORAL | 3 refills | Status: DC

## 2020-06-23 NOTE — Addendum Note (Signed)
Encounter addended by: Juluis Mire, RN on: 06/23/2020 3:14 PM  Actions taken: Order list changed

## 2020-06-23 NOTE — Telephone Encounter (Signed)
New message  Patient is calling in to speak with Nurse and would like to know if she is able to wear deodorant the day of her procedure on 06/25/20. Please give patient a call to advise.

## 2020-06-23 NOTE — Telephone Encounter (Signed)
Patient returned call, she said when you call back if you don't reach her you can leave a detailed message with the answer.

## 2020-06-23 NOTE — Telephone Encounter (Signed)
Spoke with the patient and let her know to hold off on wearing deodorant prior to her pacemaker procedure on 06/25/20. She thanked me for the call.

## 2020-06-23 NOTE — Telephone Encounter (Signed)
LMTCB regarding questions for her upcoming procedure

## 2020-06-23 NOTE — Progress Notes (Signed)
Primary Care Physician: Shirley, [name], DO Primary Cardiologist: Dr Marlou Porch Primary Electrophysiologist: Dr Curt Bears Referring Physician: Dr Marta Lamas [name] is a 69 y.o. female with a history of HTN, type I DM, HLD, thyroid cancer s/p thyroidectomy, CVA, GERD, and paroxysmal atrial fibrillation who presents for follow up in the [facility]. Patient diagnosed with CVA on 10/13/18 and was seen to have frequent atrial ectopy on telemetry. ILR placed by Dr Curt Bears. The patient was initially diagnosed with atrial fibrillation 02/09/19 on ILR, confirmed by Dr Curt Bears. The episode lasted for about 30 minutes. She was asymptomatic during the episode. Patient was hospitalized for DKA and ileus 03/2019 and her loop recorder did show increased AF burden around the time of her admission. She also had a mechanical fall on 07/06/19 with a fracture of he humerus. Per ortho notes, no surgical intervention planned. Her AF burden dramatically increased around the time of her fall but has decreased again over the last several days. She had no awareness of her arrhythmia and her rates appear to be controlled. She is on Eliquis for a CHADS2VASC score of 5.   On follow up today, patient was hospitalized 02/2020 for presyncope. She was found to have intermittent CHB on ILR. She is scheduled for PPM insertion on 06/25/20 with Dr Curt Bears. ILR shows 0.1% afib burden. She is tolerating anticoagulation without difficulty.   Today, she denies symptoms of palpitations, chest pain, orthopnea, PND, lower extremity edema, dizziness, presyncope, syncope, bleeding, or neurologic sequela. The patient is tolerating medications without difficulties and is otherwise without complaint today.    Atrial Fibrillation Risk Factors:  she does have symptoms or diagnosis of sleep apnea.  She is compliant with her CPAP therapy. she does not have a history of rheumatic fever. she does not have a  history of alcohol use. The patient does not have a history of early familial atrial fibrillation or other arrhythmias.  she has a BMI of Body mass index is 37.46 kg/m.Marland Kitchen Filed Weights   06/23/20 1429  Weight: 108.5 kg    Family History  Problem Relation Age of Onset  . Heart disease Mother   . Hyperlipidemia Mother   . Hypertension Mother   . Renal Disease Mother        insufficiency  . Heart attack Mother        in his 42's  . Early death Father        Head Injury at Work  . Hyperlipidemia Father   . Hypertension Father   . Stroke Son   . Diabetes Maternal Aunt   . Prostate cancer Paternal Uncle   . Heart disease Maternal Aunt   . Heart failure Maternal Grandfather   . Heart disease Maternal Grandfather   . Heart attack Maternal Grandfather        Died in his 5's  . Breast cancer Neg Hx      Atrial Fibrillation Management history:  Previous antiarrhythmic drugs: none Previous cardioversions: none Previous ablations: none CHADS2VASC score: 6 (female, age, CVA, DM, HTN) Anticoagulation history: Eliquis    Past Medical History:  Diagnosis Date  . Abnormal echocardiogram   . Anemia   . Arthritis   . BPPV (benign paroxysmal positional vertigo), right 06/13/2017  . Cerebrovascular accident (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 ([place]) 10/29/2015  . CHF (congestive heart failure) ([place])   . Closed fracture of left proximal humerus 07/08/2019  . Depression   . Dizzy  03/07/2020   bradycardia  . GERD (gastroesophageal reflux disease)   . Hemiparesis affecting left side as late effect of cerebrovascular accident (CVA) ([place]) 03/08/2020  . Hemorrhoids   . History of Falls with injury 03/08/2020   Fall resulting in left humeral fracture  . Hyperlipidemia   . Hypertension   . Hypothyroidism   . Intracranial vascular stenosis   . Seasonal allergies   . Stroke ([place])   . Thyroid cancer ([place])    per pt S/p Total Thyroidectomy with Radioactive Iodine Therapy  . Type 1  diabetes mellitus ([employer])    Past Surgical History:  Procedure Laterality Date  . ABDOMINAL ANGIOGRAM N/A 05/14/2012   Procedure: ABDOMINAL ANGIOGRAM;  Surgeon: Laverda Page, MD;  Location: [facility] CATH LAB;  Service: Cardiovascular;  Laterality: N/A;  . ABDOMINAL HYSTERECTOMY  1995   partial  . ANKLE FRACTURE SURGERY Right   . CARDIAC CATHETERIZATION     with coronary angiogram  . COLONOSCOPY N/A 09/09/2014   Procedure: COLONOSCOPY;  Surgeon: Gatha Mayer, MD;  Location: [place];  Service: Endoscopy;  Laterality: N/A;  . ESOPHAGOGASTRODUODENOSCOPY (EGD) WITH PROPOFOL N/A 12/03/2018   Procedure: ESOPHAGOGASTRODUODENOSCOPY (EGD) WITH PROPOFOL;  Surgeon: Doran Stabler, MD;  Location: [place];  Service: Gastroenterology;  Laterality: N/A;  . LEFT AND RIGHT HEART CATHETERIZATION WITH CORONARY ANGIOGRAM N/A 05/14/2012   Procedure: LEFT AND RIGHT HEART CATHETERIZATION WITH CORONARY ANGIOGRAM;  Surgeon: Laverda Page, MD;  Location: [facility] CATH LAB;  Service: Cardiovascular;  Laterality: N/A;  . LOOP RECORDER INSERTION N/A 12/23/2018   Procedure: LOOP RECORDER INSERTION;  Surgeon: Constance Haw, MD;  Location: [place] CV LAB;  Service: Cardiovascular;  Laterality: N/A;  . MINI METER   09/08/2014   ELECTRONIC INSULIN PUMP  . TEE WITHOUT CARDIOVERSION N/A 10/16/2018   Procedure: TRANSESOPHAGEAL ECHOCARDIOGRAM (TEE);  Surgeon: Buford Dresser, MD;  Location: [facility] ENDOSCOPY;  Service: Cardiovascular;  Laterality: N/A;  . THYROIDECTOMY  2006    Current Outpatient Medications  Medication Sig Dispense Refill  . ACCU-CHEK GUIDE test strip     . acetaminophen (TYLENOL) 500 MG tablet Take 1,000 mg by mouth every 8 (eight) hours as needed for mild pain.     Marland Kitchen amLODipine (NORVASC) 5 MG tablet Take 1 tablet (5 mg total) by mouth daily. 90 tablet 1  . apixaban (ELIQUIS) 5 MG TABS tablet Take 1 tablet (5 mg total) by mouth 2 (two) times daily. 180 tablet 0  . atorvastatin  (LIPITOR) 80 MG tablet Take 1 tablet (80 mg total) by mouth every evening. 90 tablet 2  . Blood Glucose Monitoring Suppl (ACCU-CHEK GUIDE) w/Device KIT     . Continuous Blood Gluc Transmit (DEXCOM G6 TRANSMITTER) MISC by Does not apply route. Checks her blood sugar    . CRANBERRY EXTRACT PO Take 1 capsule by mouth daily.    . fexofenadine (ALLEGRA) 180 MG tablet Take 180 mg by mouth daily as needed for allergies or rhinitis.    Marland Kitchen FLUoxetine (PROZAC) 10 MG capsule Take 10 mg by mouth daily. Take with the 20 mg dose to equal 30 mg daily    . FLUoxetine (PROZAC) 20 MG tablet Take 20 mg by mouth daily. Take with the 10 mg dose to equal 30 mg daily    . gabapentin (NEURONTIN) 300 MG capsule TAKE ONE CAPSULE BY MOUTH AT BEDTIME (Patient taking differently: Take 300 mg by mouth at bedtime. ) 90 capsule 3  . insulin aspart (NOVOLOG) 100 UNIT/ML injection  To be used in patient's insulin pump (Patient taking differently: Inject 20-30 Units into the skin daily. To be used in patient's insulin pump) 30 mL 0  . Insulin Human (INSULIN PUMP) SOLN Inject 1 each into the skin continuous. Novolog insulin 1 each 0  . levothyroxine (SYNTHROID, LEVOTHROID) 175 MCG tablet Take 175 mcg by mouth daily before breakfast.   12  . loperamide (IMODIUM A-D) 2 MG tablet Take 1 tablet (2 mg total) by mouth 4 (four) times daily as needed for diarrhea or loose stools. 30 tablet 2  . losartan (COZAAR) 100 MG tablet TAKE 1 TABLET BY MOUTH DAILY (Patient taking differently: Take 100 mg by mouth daily. ) 90 tablet 3  . Multiple Vitamins-Minerals (OCUVITE PO) Take 1 tablet by mouth daily.    . ondansetron (ZOFRAN-ODT) 4 MG disintegrating tablet Take 4 mg by mouth every 8 (eight) hours as needed for nausea or vomiting.    . pantoprazole (PROTONIX) 40 MG tablet TAKE 1 TABLET BY MOUTH TWICE DAILY BEFORE MEALS (Patient taking differently: Take 40 mg by mouth daily. ) 180 tablet 1  . potassium chloride (KLOR-CON) 10 MEQ tablet Take 1 tablet  (10 mEq total) by mouth daily. 90 tablet 1  . torsemide (DEMADEX) 10 MG tablet Take 10 mg by mouth every other day.      No current facility-administered medications for this encounter.    Allergies  Allergen Reactions  . Latex Rash    Social History   Socioeconomic History  . Marital status: Divorced    Spouse name: Not on file  . Number of children: 1  . Years of education: Not on file  . Highest education level: Not on file  Occupational History  . Occupation: retired Pharmacist, hospital  Tobacco Use  . Smoking status: Never Smoker  . Smokeless tobacco: Never Used  Vaping Use  . Vaping Use: Never used  Substance and Sexual Activity  . Alcohol use: No  . Drug use: No  . Sexual activity: Not on file  Other Topics Concern  . Not on file  Social History Narrative   Retired Pharmacist, hospital, high school teacher taught mass. One son, helping to care for her granddaughter. No caffeine. Updated 07/20/2014.   Social Determinants of Health   Financial Resource Strain:   . Difficulty of Paying Living Expenses:   Food Insecurity:   . Worried About Charity fundraiser in the Last Year:   . Arboriculturist in the Last Year:   Transportation Needs: Unmet Transportation Needs  . Lack of Transportation (Medical): Yes  . Lack of Transportation (Non-Medical): Yes  Physical Activity:   . Days of Exercise per Week:   . Minutes of Exercise per Session:   Stress:   . Feeling of Stress :   Social Connections:   . Frequency of Communication with Friends and Family:   . Frequency of Social Gatherings with Friends and Family:   . Attends Religious Services:   . Active Member of Clubs or Organizations:   . Attends Archivist Meetings:   Marland Kitchen Marital Status:   Intimate Partner Violence:   . Fear of Current or Ex-Partner:   . Emotionally Abused:   Marland Kitchen Physically Abused:   . Sexually Abused:      ROS- All systems are reviewed and negative except as per the HPI above.  Physical Exam: Vitals:     06/23/20 1429  BP: (!) 118/46  Pulse: 65  Weight: 108.5 kg  Height: 5'  7" (1.702 m)    GEN- The patient is well appearing obese female, alert and oriented x 3 today.   HEENT-head normocephalic, atraumatic, sclera clear, conjunctiva pink, hearing intact, trachea midline. Lungs- Clear to ausculation bilaterally, normal work of breathing Heart- Regular rate and rhythm, no murmurs, rubs or gallops  GI- soft, NT, ND, + BS Extremities- no clubbing, cyanosis, or edema MS- no significant deformity or atrophy Skin- no rash or lesion Psych- euthymic mood, full affect Neuro- strength and sensation are intact   Wt Readings from Last 3 Encounters:  06/23/20 108.5 kg  06/15/20 108.4 kg  06/02/20 108.9 kg    EKG today demonstrates complete heart block, HR 65, QRS 90, QTc 459  Echo 10/14/18 demonstrated  - Procedure narrative: Transthoracic echocardiography. Technically   difficult study. Intravenous contrast (Definity) was   administered. - Left ventricle: The cavity size was normal. There was moderate   concentric hypertrophy. Systolic function was normal. The   estimated ejection fraction was in the range of 60% to 65%. Wall   motion was normal; there were no regional wall motion   abnormalities. The study is not technically sufficient to allow   evaluation of LV diastolic function. - Left atrium: The atrium was normal in size. - Right atrium: The atrium was normal in size.  Epic records are reviewed at length today  Assessment and Plan:  1. Paroxysmal atrial fibrillation Patient AF burden low at 0.1% on last ILR interrogation. Continue Eliquis 5 mg BID Lifestyle modifications as below.  This patients CHA2DS2-VASc Score and unadjusted Ischemic Stroke Rate (% per year) is equal to 9.7 % stroke rate/year from a score of 6  Above score calculated as 1 point each if present [CHF, HTN, DM, Vascular=MI/PAD/Aortic Plaque, Age if 65-74, or Female] Above score calculated as 2 points  each if present [Age > 75, or Stroke/TIA/TE]  2. Obesity Body mass index is 37.46 kg/m. Lifestyle modification was discussed and encouraged including regular physical activity and weight reduction.  3. OSA Patient compliant with BiPap therapy.  4. HTN Stable, no changes today.  5. CHB Plans for PPM noted per Dr Curt Bears.    Follow up with Dr Curt Bears for PPM insertion. AF clinic as needed.    [facility] [address] [place], [place] [address] [phone] 06/23/2020 2:59 PM

## 2020-06-24 ENCOUNTER — Telehealth: Payer: Self-pay | Admitting: Licensed Clinical Social Worker

## 2020-06-24 DIAGNOSIS — R296 Repeated falls: Secondary | ICD-10-CM | POA: Diagnosis not present

## 2020-06-24 NOTE — Telephone Encounter (Signed)
Follow up   Patient has questions about her procedure on tomorrow. Please call to discuss.

## 2020-06-24 NOTE — Telephone Encounter (Signed)
Pt want you to know that she call and for you to call her.  [phone]

## 2020-06-24 NOTE — Telephone Encounter (Signed)
Patient returning [name]'s call. 

## 2020-06-24 NOTE — Telephone Encounter (Signed)
Pt asked if she goes to admitting when she gets to the hospital tomorrow. Informed that she does. Transferred call to schedule to arrange post procedure follow up

## 2020-06-24 NOTE — Telephone Encounter (Signed)
lmtcb

## 2020-06-24 NOTE — Progress Notes (Signed)
Instructed patient on the following items: Arrival time 10:00  Nothing to eat or drink after midnight No meds AM of procedure Responsible person to drive you home and stay with you for 24 hrs Wash with special soap night before and morning of procedure If on anti-coagulant drug instructions: Eliquis on to take today, don't take any in the morning.  Confirmed patient has responsible adult to drive home post procedure and observe 24 hours after arriving home:   You are allowed ONE visitor in the waiting room during your procedure. Both you and your visitor must wear masks.      COVID-19 Pre-Screening Questions:  . In the past 7 to 10 days have you had a new cough, shortness of breath, headache, congestion, fever (100 or greater) unexplained body aches, new sore throat, or sudden loss of taste or sense of smell?  No problems currently  . In the past 7 to 10 days have you been around anyone with known Covid 19?          No recent contacts

## 2020-06-25 ENCOUNTER — Ambulatory Visit (HOSPITAL_COMMUNITY)
Admission: RE | Disposition: A | Discharge status: Home / Self Care | Payer: Self-pay | Source: Home / Self Care | Attending: Cardiology

## 2020-06-25 ENCOUNTER — Other Ambulatory Visit: Payer: Self-pay

## 2020-06-25 ENCOUNTER — Ambulatory Visit (HOSPITAL_COMMUNITY)
Admission: RE | Admit: 2020-06-25 | Discharge: 2020-06-26 | Disposition: A | Discharge status: Home / Self Care | Payer: Medicare PPO | Attending: Cardiology | Admitting: Cardiology

## 2020-06-25 ENCOUNTER — Ambulatory Visit: Payer: Medicare PPO | Admitting: Licensed Clinical Social Worker

## 2020-06-25 DIAGNOSIS — K219 Gastro-esophageal reflux disease without esophagitis: Secondary | ICD-10-CM | POA: Insufficient documentation

## 2020-06-25 DIAGNOSIS — Z794 Long term (current) use of insulin: Secondary | ICD-10-CM | POA: Diagnosis not present

## 2020-06-25 DIAGNOSIS — F329 Major depressive disorder, single episode, unspecified: Secondary | ICD-10-CM | POA: Insufficient documentation

## 2020-06-25 DIAGNOSIS — M1990 Unspecified osteoarthritis, unspecified site: Secondary | ICD-10-CM | POA: Insufficient documentation

## 2020-06-25 DIAGNOSIS — Z8249 Family history of ischemic heart disease and other diseases of the circulatory system: Secondary | ICD-10-CM | POA: Diagnosis not present

## 2020-06-25 DIAGNOSIS — Z833 Family history of diabetes mellitus: Secondary | ICD-10-CM | POA: Insufficient documentation

## 2020-06-25 DIAGNOSIS — I441 Atrioventricular block, second degree: Secondary | ICD-10-CM | POA: Diagnosis present

## 2020-06-25 DIAGNOSIS — E1059 Type 1 diabetes mellitus with other circulatory complications: Secondary | ICD-10-CM | POA: Diagnosis present

## 2020-06-25 DIAGNOSIS — Z6837 Body mass index (BMI) 37.0-37.9, adult: Secondary | ICD-10-CM | POA: Diagnosis not present

## 2020-06-25 DIAGNOSIS — Z7901 Long term (current) use of anticoagulants: Secondary | ICD-10-CM

## 2020-06-25 DIAGNOSIS — E785 Hyperlipidemia, unspecified: Secondary | ICD-10-CM | POA: Insufficient documentation

## 2020-06-25 DIAGNOSIS — I509 Heart failure, unspecified: Secondary | ICD-10-CM | POA: Diagnosis not present

## 2020-06-25 DIAGNOSIS — R001 Bradycardia, unspecified: Secondary | ICD-10-CM | POA: Diagnosis present

## 2020-06-25 DIAGNOSIS — G4733 Obstructive sleep apnea (adult) (pediatric): Secondary | ICD-10-CM | POA: Insufficient documentation

## 2020-06-25 DIAGNOSIS — E669 Obesity, unspecified: Secondary | ICD-10-CM | POA: Diagnosis not present

## 2020-06-25 DIAGNOSIS — I480 Paroxysmal atrial fibrillation: Secondary | ICD-10-CM | POA: Diagnosis present

## 2020-06-25 DIAGNOSIS — Z79899 Other long term (current) drug therapy: Secondary | ICD-10-CM | POA: Insufficient documentation

## 2020-06-25 DIAGNOSIS — Z950 Presence of cardiac pacemaker: Secondary | ICD-10-CM | POA: Diagnosis not present

## 2020-06-25 DIAGNOSIS — Z95818 Presence of other cardiac implants and grafts: Secondary | ICD-10-CM

## 2020-06-25 DIAGNOSIS — Z823 Family history of stroke: Secondary | ICD-10-CM | POA: Insufficient documentation

## 2020-06-25 DIAGNOSIS — I69354 Hemiplegia and hemiparesis following cerebral infarction affecting left non-dominant side: Secondary | ICD-10-CM | POA: Diagnosis not present

## 2020-06-25 DIAGNOSIS — E890 Postprocedural hypothyroidism: Secondary | ICD-10-CM | POA: Insufficient documentation

## 2020-06-25 DIAGNOSIS — Z91040 Latex allergy status: Secondary | ICD-10-CM | POA: Insufficient documentation

## 2020-06-25 DIAGNOSIS — I110 Hypertensive heart disease with heart failure: Secondary | ICD-10-CM | POA: Diagnosis not present

## 2020-06-25 DIAGNOSIS — E109 Type 1 diabetes mellitus without complications: Secondary | ICD-10-CM | POA: Diagnosis not present

## 2020-06-25 DIAGNOSIS — I442 Atrioventricular block, complete: Secondary | ICD-10-CM | POA: Diagnosis not present

## 2020-06-25 DIAGNOSIS — Z139 Encounter for screening, unspecified: Secondary | ICD-10-CM

## 2020-06-25 HISTORY — PX: PACEMAKER IMPLANT: EP1218

## 2020-06-25 HISTORY — PX: LOOP RECORDER REMOVAL: EP1215

## 2020-06-25 LAB — HEMOGLOBIN A1C
Hgb A1c MFr Bld: 8.6 % — ABNORMAL HIGH (ref 4.8–5.6)
Mean Plasma Glucose: 200.12 mg/dL

## 2020-06-25 LAB — BASIC METABOLIC PANEL
Anion gap: 9 (ref 5–15)
BUN: 9 mg/dL (ref 8–23)
CO2: 18 mmol/L — ABNORMAL LOW (ref 22–32)
Calcium: 6.9 mg/dL — ABNORMAL LOW (ref 8.9–10.3)
Chloride: 115 mmol/L — ABNORMAL HIGH (ref 98–111)
Creatinine, Ser: 0.75 mg/dL (ref 0.44–1.00)
GFR calc Af Amer: >60 mL/min (ref >60)
GFR calc non Af Amer: >60 mL/min (ref >60)
Glucose, Bld: 171 mg/dL — ABNORMAL HIGH (ref 70–99)
Potassium: 3.6 mmol/L (ref 3.5–5.1)
Sodium: 142 mmol/L (ref 135–145)

## 2020-06-25 LAB — CBC
HCT: 35.2 % — ABNORMAL LOW (ref 36.0–46.0)
Hemoglobin: 10.7 g/dL — ABNORMAL LOW (ref 12.0–15.0)
MCH: 28.4 pg (ref 26.0–34.0)
MCHC: 30.4 g/dL (ref 30.0–36.0)
MCV: 93.4 fL (ref 80.0–100.0)
Platelets: 191 10*3/uL (ref 150–400)
RBC: 3.77 MIL/uL — ABNORMAL LOW (ref 3.87–5.11)
RDW: 13.1 % (ref 11.5–15.5)
WBC: 5 10*3/uL (ref 4.0–10.5)
nRBC: 0 % (ref 0.0–0.2)

## 2020-06-25 LAB — GLUCOSE, CAPILLARY
Glucose-Capillary: 178 mg/dL — ABNORMAL HIGH (ref 70–99)
Glucose-Capillary: 202 mg/dL — ABNORMAL HIGH (ref 70–99)
Glucose-Capillary: 171 mg/dL — ABNORMAL HIGH (ref 70–99)
Glucose-Capillary: 127 mg/dL — ABNORMAL HIGH (ref 70–99)

## 2020-06-25 SURGERY — PACEMAKER IMPLANT

## 2020-06-25 MED ORDER — APIXABAN 5 MG PO TABS
5.0000 mg | ORAL_TABLET | Freq: Two times a day (BID) | ORAL | Status: DC
  Administered 2020-06-25 – 2020-06-26 (×2): 5 mg via ORAL
  Filled 2020-06-25 (×2): qty 1

## 2020-06-25 MED ORDER — LOSARTAN POTASSIUM 50 MG PO TABS
100.0000 mg | ORAL_TABLET | Freq: Every day | ORAL | Status: DC
  Administered 2020-06-25 – 2020-06-26 (×2): 100 mg via ORAL
  Filled 2020-06-25 (×2): qty 2

## 2020-06-25 MED ORDER — INSULIN PUMP
1.0000 | SUBCUTANEOUS | Status: DC
  Administered 2020-06-26: 1 via SUBCUTANEOUS
  Filled 2020-06-25: qty 1

## 2020-06-25 MED ORDER — LOPERAMIDE HCL 2 MG PO CAPS: 2.0000 mg | ORAL_CAPSULE | Freq: Four times a day (QID) | ORAL | Status: DC | PRN

## 2020-06-25 MED ORDER — SODIUM CHLORIDE 0.9 % IV SOLN: INTRAVENOUS | Status: DC

## 2020-06-25 MED ORDER — LORATADINE 10 MG PO TABS
10.0000 mg | ORAL_TABLET | Freq: Every day | ORAL | Status: DC
  Administered 2020-06-25 – 2020-06-26 (×2): 10 mg via ORAL
  Filled 2020-06-25 (×2): qty 1

## 2020-06-25 MED ORDER — IOHEXOL 350 MG/ML SOLN
INTRAVENOUS | Status: DC | PRN
  Administered 2020-06-25: 10 mL via INTRAVENOUS

## 2020-06-25 MED ORDER — ONDANSETRON 4 MG PO TBDP
4.0000 mg | ORAL_TABLET | Freq: Three times a day (TID) | ORAL | Status: DC | PRN
  Filled 2020-06-25: qty 1

## 2020-06-25 MED ORDER — LEVOTHYROXINE SODIUM 175 MCG PO TABS
175.0000 ug | ORAL_TABLET | Freq: Every day | ORAL | Status: DC
  Administered 2020-06-26: 175 ug via ORAL
  Filled 2020-06-25: qty 1

## 2020-06-25 MED ORDER — HEPARIN (PORCINE) IN NACL 1000-0.9 UT/500ML-% IV SOLN
INTRAVENOUS | Status: AC
  Filled 2020-06-25: qty 500

## 2020-06-25 MED ORDER — ACETAMINOPHEN 500 MG PO TABS
1000.0000 mg | ORAL_TABLET | Freq: Three times a day (TID) | ORAL | Status: DC | PRN
  Administered 2020-06-26 (×2): 1000 mg via ORAL
  Filled 2020-06-25 (×2): qty 2

## 2020-06-25 MED ORDER — ATORVASTATIN CALCIUM 80 MG PO TABS
80.0000 mg | ORAL_TABLET | Freq: Every evening | ORAL | Status: DC
  Administered 2020-06-25: 80 mg via ORAL
  Filled 2020-06-25: qty 1

## 2020-06-25 MED ORDER — GABAPENTIN 300 MG PO CAPS
300.0000 mg | ORAL_CAPSULE | Freq: Every day | ORAL | Status: DC
  Administered 2020-06-25: 300 mg via ORAL
  Filled 2020-06-25: qty 1

## 2020-06-25 MED ORDER — LIDOCAINE HCL (PF) 1 % IJ SOLN
INTRAMUSCULAR | Status: DC | PRN
  Administered 2020-06-25: 60 mL

## 2020-06-25 MED ORDER — CEFAZOLIN SODIUM-DEXTROSE 1-4 GM/50ML-% IV SOLN
1.0000 g | Freq: Four times a day (QID) | INTRAVENOUS | Status: DC
  Filled 2020-06-25 (×3): qty 50

## 2020-06-25 MED ORDER — ONDANSETRON HCL 4 MG/2ML IJ SOLN: 4.0000 mg | Freq: Four times a day (QID) | INTRAMUSCULAR | Status: DC | PRN

## 2020-06-25 MED ORDER — FENTANYL CITRATE (PF) 100 MCG/2ML IJ SOLN
INTRAMUSCULAR | Status: AC
  Filled 2020-06-25: qty 2

## 2020-06-25 MED ORDER — MIDAZOLAM HCL 5 MG/5ML IJ SOLN
INTRAMUSCULAR | Status: AC
  Filled 2020-06-25: qty 5

## 2020-06-25 MED ORDER — FENTANYL CITRATE (PF) 100 MCG/2ML IJ SOLN
INTRAMUSCULAR | Status: DC | PRN
  Administered 2020-06-25 (×2): 25 ug via INTRAVENOUS

## 2020-06-25 MED ORDER — TORSEMIDE 10 MG PO TABS
10.0000 mg | ORAL_TABLET | ORAL | Status: DC
  Administered 2020-06-25: 10 mg via ORAL
  Filled 2020-06-25: qty 1

## 2020-06-25 MED ORDER — ACETAMINOPHEN 325 MG PO TABS
325.0000 mg | ORAL_TABLET | ORAL | Status: DC | PRN
  Administered 2020-06-25: 650 mg via ORAL

## 2020-06-25 MED ORDER — HEPARIN (PORCINE) IN NACL 1000-0.9 UT/500ML-% IV SOLN
INTRAVENOUS | Status: DC | PRN
  Administered 2020-06-25: 500 mL

## 2020-06-25 MED ORDER — FLUOXETINE HCL 10 MG PO CAPS
30.0000 mg | ORAL_CAPSULE | Freq: Every day | ORAL | Status: DC
  Administered 2020-06-25 – 2020-06-26 (×2): 30 mg via ORAL
  Filled 2020-06-25 (×2): qty 3

## 2020-06-25 MED ORDER — SODIUM CHLORIDE 0.9 % IV SOLN
INTRAVENOUS | Status: DC | PRN
  Administered 2020-06-25 – 2020-06-26 (×2): 250 mL via INTRAVENOUS

## 2020-06-25 MED ORDER — POTASSIUM CHLORIDE CRYS ER 10 MEQ PO TBCR
10.0000 meq | EXTENDED_RELEASE_TABLET | Freq: Every day | ORAL | Status: DC
  Administered 2020-06-25: 10 meq via ORAL
  Filled 2020-06-25 (×2): qty 1

## 2020-06-25 MED ORDER — LOPERAMIDE HCL 2 MG PO TABS: 2.0000 mg | ORAL_TABLET | Freq: Four times a day (QID) | ORAL | Status: DC | PRN

## 2020-06-25 MED ORDER — LIDOCAINE HCL 1 % IJ SOLN
INTRAMUSCULAR | Status: AC
  Filled 2020-06-25: qty 60

## 2020-06-25 MED ORDER — MIDAZOLAM HCL 5 MG/5ML IJ SOLN
INTRAMUSCULAR | Status: DC | PRN
  Administered 2020-06-25 (×2): 1 mg via INTRAVENOUS

## 2020-06-25 MED ORDER — INSULIN ASPART 100 UNIT/ML ~~LOC~~ SOLN: 20.0000 [IU] | SUBCUTANEOUS | Status: DC

## 2020-06-25 MED ORDER — CEFAZOLIN SODIUM-DEXTROSE 1-4 GM/50ML-% IV SOLN
1.0000 g | Freq: Four times a day (QID) | INTRAVENOUS | Status: AC
  Administered 2020-06-25 – 2020-06-26 (×3): 1 g via INTRAVENOUS
  Filled 2020-06-25 (×3): qty 50

## 2020-06-25 MED ORDER — CEFAZOLIN SODIUM-DEXTROSE 2-4 GM/100ML-% IV SOLN
INTRAVENOUS | Status: AC
  Filled 2020-06-25: qty 100

## 2020-06-25 MED ORDER — ACETAMINOPHEN 325 MG PO TABS
325.0000 mg | ORAL_TABLET | ORAL | Status: DC | PRN
  Filled 2020-06-25 (×2): qty 2

## 2020-06-25 MED ORDER — CEFAZOLIN SODIUM-DEXTROSE 2-4 GM/100ML-% IV SOLN
2.0000 g | INTRAVENOUS | Status: AC
  Administered 2020-06-25: 2 g via INTRAVENOUS

## 2020-06-25 MED ORDER — PANTOPRAZOLE SODIUM 40 MG PO TBEC
40.0000 mg | DELAYED_RELEASE_TABLET | Freq: Every day | ORAL | Status: DC
  Administered 2020-06-25 – 2020-06-26 (×2): 40 mg via ORAL
  Filled 2020-06-25 (×2): qty 1

## 2020-06-25 MED ORDER — FLUOXETINE HCL 20 MG PO TABS: 20.0000 mg | ORAL_TABLET | Freq: Every day | ORAL | Status: DC

## 2020-06-25 MED ORDER — INSULIN ASPART 100 UNIT/ML ~~LOC~~ SOLN: 0.0000 [IU] | Freq: Three times a day (TID) | SUBCUTANEOUS | Status: DC

## 2020-06-25 MED ORDER — CHLORHEXIDINE GLUCONATE 4 % EX LIQD
4.0000 "application " | Freq: Once | CUTANEOUS | Status: DC
  Filled 2020-06-25: qty 60

## 2020-06-25 MED ORDER — AMLODIPINE BESYLATE 5 MG PO TABS
5.0000 mg | ORAL_TABLET | Freq: Every day | ORAL | Status: DC
  Administered 2020-06-25 – 2020-06-26 (×2): 5 mg via ORAL
  Filled 2020-06-25 (×2): qty 1

## 2020-06-25 MED ORDER — SODIUM CHLORIDE 0.9 % IV SOLN
80.0000 mg | INTRAVENOUS | Status: AC
  Administered 2020-06-25: 80 mg

## 2020-06-25 MED ORDER — SODIUM CHLORIDE 0.9 % IV SOLN
INTRAVENOUS | Status: AC
  Filled 2020-06-25: qty 2

## 2020-06-25 SURGICAL SUPPLY — 9 items
CABLE SURGICAL S-101-97-12 (CABLE) ×3 IMPLANT
IPG PACE AZUR XT DR MRI W1DR01 (Pacemaker) ×1 IMPLANT
LEAD CAPSURE NOVUS 5076-52CM (Lead) ×3 IMPLANT
LEAD CAPSURE NOVUS 5076-58CM (Lead) ×3 IMPLANT
PACE AZURE XT DR MRI W1DR01 (Pacemaker) ×3 IMPLANT
PACK LOOP INSERTION (CUSTOM PROCEDURE TRAY) ×3 IMPLANT
PAD PRO RADIOLUCENT 2001M-C (PAD) ×3 IMPLANT
SHEATH 7FR PRELUDE SNAP 13 (SHEATH) ×6 IMPLANT
TRAY PACEMAKER INSERTION (PACKS) ×3 IMPLANT

## 2020-06-25 NOTE — Progress Notes (Signed)
PT Cancellation Note  Patient Details Name: [name] MRN: [id number] DOB: 1951-08-29   Cancelled Treatment:    Reason Eval/Treat Not Completed: Active bedrest order.  I came to short stay to educate pt and RN re: pacemaker precautions (handout given and reviewed).  Pt uses a RW for mobility at baseline and can return to using it at discharge as long as she is using it lightly for stability.  We reviewed the L shoulder precautions (pt is R handed) and she reports she cannot do those motions anyway as she has a L humerus fx that did not heal well and limits her ROM on this side. She dresses her left arm first already and she never lifts anything or pushes or pulls with her left.  She remains on bedrest, so I did not get her up to mobilize.  Pt reports as long as she can steady herself on the RW she feels comfortable going home.  She has a ramped entrance that she uses an electric scooter to gain access to the house and then uses the RW to get to the bathroom.  She has excellent family support at d/c and does not drive.   PT to sign off. Verdene Lennert, PT, DPT  Acute Rehabilitation [phone] pager #[phone] office       [name] 06/25/2020, 4:55 PM

## 2020-06-25 NOTE — Chronic Care Management (AMB) (Signed)
Care Management   Clinical Social Work Follow Up   06/25/2020 Name: [name] MRN: [id number] DOB: [birth year] Referred by: Eulis Foster, MD  Reason for referral : Care Coordination (transportation auth)  [name] is a 69 y.o. year old female who is a primary care patient of [name], [name], MD.  Reason for follow-up:  Returned call to patient who wanted to follow up on transportation concerns. Provided update on SCAT application and barriers with obtaining auth from insurance provider.  Patient on her way to hospital.  Will discuss further with LCSW when she returns home.    After calling insurance provider and being transferred to several reps. Unable to assist patient with resolving transportation for ongoing needs.  Plan: LCSW will F/U with patient in 2 weeks to discuss options  Advance Directive Status: not addressed during this encounter.  SDOH (Social Determinants of Health) assessments performed: Yes ;  SDOH Interventions     Most Recent Value  SDOH Interventions  SDOH Interventions for the Following Domains Transportation  Transportation Interventions SCAT ([place])  [assisted with application and coord. with PCP]      Goals Addressed            This Visit's Progress   . Barriers with Transportation       CARE PLAN ENTRY (see longitudinal plan of care for additional care plan information)  Current Barriers & Progress:  . Patient that is wheelchair dependent needs transportation to medical appointments,  . Acknowledges deficits and needs support, education and care coordination in order to meet this unmet need  . PCP completed her part on SCAT application, application faxed by [facility] front office staff . Barrier with pre auth from insurance provider for none emergency transportation ( RN clinic not successful with obtaining information to assist with auth after call intake number  provider. Clinical Goal(s):  Over the next 20 days, patient will receive transportation and be able to safely  travel without fear of missing appointments Interventions: . Assessed patient needs, what she has used in the past and how currently meeting needs . Collaborated with PCP, [facility] RN clinic and [employer] rep. regarding patient needs . LCSW called [employer] to confirm what is needed for none emergency EMS transportation to medical appointments (consulted and collaborated with [employer]. ( transferred to several agents still unable to accomplish or obtain needed information.  Will provide update to patient who will have to contact insurance provider and have them fax needed information to [facility] office. . Provided [employer] clinical intake phone number to RN clinic  to answer questions [phone]; (fax number [phone]) Patient Self Care Activities: . Patient is unable to independently navigate obtaining auth for transportation without care coordination support  . Patient's family will transport her to next two appointments until other options are resolved. Please see past updates related to this goal by clicking on the "Past Updates" button in the selected goal          Outpatient Encounter Medications as of 06/25/2020  Medication Sig Note  . ACCU-CHEK GUIDE test strip    . acetaminophen (TYLENOL) 500 MG tablet Take 1,000 mg by mouth every 8 (eight) hours as needed for mild pain.    Marland Kitchen amLODipine (NORVASC) 5 MG tablet Take 1 tablet (5 mg total) by mouth daily.   Marland Kitchen apixaban (ELIQUIS) 5 MG TABS tablet Take 1 tablet (5 mg total) by mouth 2 (two) times daily.   Marland Kitchen atorvastatin (LIPITOR)  80 MG tablet Take 1 tablet (80 mg total) by mouth every evening.   . Blood Glucose Monitoring Suppl (ACCU-CHEK GUIDE) w/Device KIT    . Continuous Blood Gluc Transmit (DEXCOM G6 TRANSMITTER) MISC by Does not apply route. Checks her blood sugar   . CRANBERRY EXTRACT PO Take 1 capsule by mouth daily.   .  fexofenadine (ALLEGRA) 180 MG tablet Take 180 mg by mouth daily as needed for allergies or rhinitis.   Marland Kitchen FLUoxetine (PROZAC) 10 MG capsule Take 10 mg by mouth daily. Take with the 20 mg dose to equal 30 mg daily   . FLUoxetine (PROZAC) 20 MG tablet Take 20 mg by mouth daily. Take with the 10 mg dose to equal 30 mg daily   . gabapentin (NEURONTIN) 300 MG capsule TAKE ONE CAPSULE BY MOUTH AT BEDTIME (Patient taking differently: Take 300 mg by mouth at bedtime. )   . insulin aspart (NOVOLOG) 100 UNIT/ML injection To be used in patient's insulin pump (Patient taking differently: Inject 20-30 Units into the skin daily. To be used in patient's insulin pump) 06/16/2020: Pt states she gets anywhere from 20 to 30 units per day via her pump   . Insulin Human (INSULIN PUMP) SOLN Inject 1 each into the skin continuous. Novolog insulin   . levothyroxine (SYNTHROID, LEVOTHROID) 175 MCG tablet Take 175 mcg by mouth daily before breakfast.    . loperamide (IMODIUM A-D) 2 MG tablet Take 1 tablet (2 mg total) by mouth 4 (four) times daily as needed for diarrhea or loose stools.   Marland Kitchen losartan (COZAAR) 100 MG tablet TAKE 1 TABLET BY MOUTH DAILY (Patient taking differently: Take 100 mg by mouth daily. )   . Multiple Vitamins-Minerals (OCUVITE PO) Take 1 tablet by mouth daily.   . ondansetron (ZOFRAN-ODT) 4 MG disintegrating tablet Take 4 mg by mouth every 8 (eight) hours as needed for nausea or vomiting.   . pantoprazole (PROTONIX) 40 MG tablet TAKE 1 TABLET BY MOUTH TWICE DAILY BEFORE MEALS (Patient taking differently: Take 40 mg by mouth daily. )   . potassium chloride (KLOR-CON) 10 MEQ tablet Take 1 tablet (10 mEq total) by mouth daily.   Marland Kitchen torsemide (DEMADEX) 10 MG tablet Take 10 mg by mouth every other day.    . [DISCONTINUED] losartan (COZAAR) 25 MG tablet Take 1 tablet (25 mg total) by mouth at bedtime.   . [DISCONTINUED] ondansetron (ZOFRAN-ODT) 4 MG disintegrating tablet DISSOLVE ONE TABLET BY MOUTH EVERY 8 HOURS  AS NEEDED FOR NAUSEA AND VOMITING   . [DISCONTINUED] pantoprazole (PROTONIX) 40 MG tablet Take 1 tablet (40 mg total) by mouth daily.    No facility-administered encounter medications on file as of 06/25/2020.    Review of patient status, including review of consultants reports, relevant laboratory and other test results, and collaboration with appropriate care team members and the patient's provider was performed as part of comprehensive patient evaluation and provision of care management services.    Casimer Lanius, [place] / [place]   [phone] 9:56 AM

## 2020-06-25 NOTE — Discharge Instructions (Signed)
° ° °  Loop recorder site removal wound care: keep clean and dry. Follow same wound care instructions below.   Supplemental Discharge Instructions for  Pacemaker/Defibrillator Patients     Activity No heavy lifting or vigorous activity with your left/right arm for 6 to 8 weeks.  Do not raise your left/right arm above your head for one week.  Gradually raise your affected arm as drawn below.              06/29/2020                  06/30/2020                  07/01/2020                 07/02/2020 __  NO DRIVING until your wound check visit .  WOUND CARE - Keep the wound area clean and dry.  Do not get this area wet, no showers untiil cleared to at your wound check visit - The tape/steri-strips on your wound will fall off; do not pull them off.  No bandage is needed on the site.  DO  NOT apply any creams, oils, or ointments to the wound area. - If you notice any drainage or discharge from the wound, any swelling or bruising at the site, or you develop a fever > 101? F after you are discharged home, call the office at once.  Special Instructions - You are still able to use cellular telephones; use the ear opposite the side where you have your pacemaker/defibrillator.  Avoid carrying your cellular phone near your device. - When traveling through airports, show security personnel your identification card to avoid being screened in the metal detectors.  Ask the security personnel to use the hand wand. - Avoid arc welding equipment, MRI testing (magnetic resonance imaging), TENS units (transcutaneous nerve stimulators).  Call the office for questions about other devices. - Avoid electrical appliances that are in poor condition or are not properly grounded. - Microwave ovens are safe to be near or to operate.

## 2020-06-25 NOTE — Progress Notes (Signed)
Client transferred to 3-E-27 via bed and report given to RN

## 2020-06-25 NOTE — Plan of Care (Signed)
  Problem: Education: Goal: Knowledge of General Education information will improve Description: Including pain rating scale, medication(s)/side effects and non-pharmacologic comfort measures Outcome: Progressing   Problem: Health Behavior/Discharge Planning: Goal: Ability to manage health-related needs will improve Outcome: Progressing   Problem: Clinical Measurements: Goal: Ability to maintain clinical measurements within normal limits will improve Outcome: Progressing   Problem: Coping: Goal: Level of anxiety will decrease Outcome: Progressing   Problem: Pain Managment: Goal: General experience of comfort will improve Outcome: Progressing   Problem: Safety: Goal: Ability to remain free from injury will improve Outcome: Progressing   Problem: Activity: Goal: Capacity to carry out activities will improve Outcome: Progressing

## 2020-06-25 NOTE — H&P (Signed)
[name] has presented today for surgery, with the diagnosis of second degree AV block.  The various methods of treatment have been discussed with the patient and family. After consideration of risks, benefits and other options for treatment, the patient has consented to  Procedure(s): Pacemaker implant, LINQ explant as a surgical intervention .  Risks include but not limited to bleeding, tamponade, infection, pneumothorax, among others. The patient's history has been reviewed, patient examined, no change in status, stable for surgery.  I have reviewed the patient's chart and labs.  Questions were answered to the patient's satisfaction.    Johnston Maddocks Curt Bears, MD 06/25/2020 10:20 AM

## 2020-06-25 NOTE — Progress Notes (Signed)
Pt used home device to check CBG. Stated it was 59. Pt stable and given food and drink.

## 2020-06-25 NOTE — Progress Notes (Signed)
Pt unable to use bedpan. Had to use the Steady to get pt on bedside commode. Unable to urinate. Patient placed back in bed via the Steady.

## 2020-06-25 NOTE — Progress Notes (Signed)
Dr Camnitz in to see client 

## 2020-06-26 ENCOUNTER — Ambulatory Visit (HOSPITAL_COMMUNITY): Payer: Medicare PPO

## 2020-06-26 DIAGNOSIS — G4733 Obstructive sleep apnea (adult) (pediatric): Secondary | ICD-10-CM | POA: Diagnosis not present

## 2020-06-26 DIAGNOSIS — E890 Postprocedural hypothyroidism: Secondary | ICD-10-CM | POA: Diagnosis not present

## 2020-06-26 DIAGNOSIS — I441 Atrioventricular block, second degree: Secondary | ICD-10-CM | POA: Diagnosis not present

## 2020-06-26 DIAGNOSIS — J90 Pleural effusion, not elsewhere classified: Secondary | ICD-10-CM | POA: Diagnosis not present

## 2020-06-26 DIAGNOSIS — E669 Obesity, unspecified: Secondary | ICD-10-CM | POA: Diagnosis not present

## 2020-06-26 DIAGNOSIS — I517 Cardiomegaly: Secondary | ICD-10-CM | POA: Diagnosis not present

## 2020-06-26 DIAGNOSIS — Z950 Presence of cardiac pacemaker: Secondary | ICD-10-CM

## 2020-06-26 DIAGNOSIS — F329 Major depressive disorder, single episode, unspecified: Secondary | ICD-10-CM | POA: Diagnosis not present

## 2020-06-26 DIAGNOSIS — E785 Hyperlipidemia, unspecified: Secondary | ICD-10-CM | POA: Diagnosis not present

## 2020-06-26 DIAGNOSIS — I110 Hypertensive heart disease with heart failure: Secondary | ICD-10-CM | POA: Diagnosis not present

## 2020-06-26 DIAGNOSIS — I509 Heart failure, unspecified: Secondary | ICD-10-CM | POA: Diagnosis not present

## 2020-06-26 DIAGNOSIS — E109 Type 1 diabetes mellitus without complications: Secondary | ICD-10-CM | POA: Diagnosis not present

## 2020-06-26 DIAGNOSIS — I442 Atrioventricular block, complete: Secondary | ICD-10-CM | POA: Diagnosis not present

## 2020-06-26 HISTORY — DX: Presence of cardiac pacemaker: Z95.0

## 2020-06-26 LAB — GLUCOSE, CAPILLARY
Glucose-Capillary: 153 mg/dL — ABNORMAL HIGH (ref 70–99)
Glucose-Capillary: 165 mg/dL — ABNORMAL HIGH (ref 70–99)

## 2020-06-26 MED ORDER — PANTOPRAZOLE SODIUM 40 MG PO TBEC: 40.0000 mg | DELAYED_RELEASE_TABLET | Freq: Every day | ORAL | Status: DC

## 2020-06-26 MED ORDER — LOSARTAN POTASSIUM 100 MG PO TABS: 100.0000 mg | ORAL_TABLET | Freq: Every day | ORAL | Status: DC

## 2020-06-26 MED ORDER — TRAMADOL HCL 50 MG PO TABS
50.0000 mg | ORAL_TABLET | Freq: Once | ORAL | Status: DC
  Filled 2020-06-26: qty 1

## 2020-06-26 MED ORDER — INSULIN ASPART 100 UNIT/ML ~~LOC~~ SOLN: 20.0000 [IU] | SUBCUTANEOUS | Status: DC

## 2020-06-26 NOTE — Discharge Summary (Signed)
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 Left upper extremity venography.   2. Pacemaker implantation.     INTRODUCTION: [name] is a 69 y.o. female  with a history of bradycardia who presents today for pacemaker implantation.  The patient reports intermittent episodes of dizziness over the past few months.  No reversible causes have been identified.  The patient therefore presents today for pacemaker implantation.     DESCRIPTION OF PROCEDURE:  Informed written consent was obtained, and   the patient was brought to the electrophysiology lab in a fasting state.  The patient required no sedation for the procedure today.  The patients left chest was prepped and draped in the usual sterile fashion by the EP lab staff. The skin overlying the left deltopectoral region was infiltrated with lidocaine for local analgesia.  A 4-cm incision was made over the left deltopectoral region.  A left subcutaneous pacemaker pocket was fashioned using a combination of sharp and blunt dissection. Electrocautery was required to assure hemostasis.    Left Upper Extremity Venography: A venogram of the left upper extremity  was performed, which revealed a large left cephalic vein, which emptied into a large left subclavian vein.  The left axillary vein was moderate in size.    RA/RV Lead Placement: The left axillary vein was cannulated.  Through the left axillary vein, a Medtronic model 5076 (serial number [id number]) right atrial lead and a Medtronic model 5076 (serial number [id number] [id number]) right ventricular lead were advanced with fluoroscopic visualization into the right atrial appendage and right ventricular apex positions respectively.  Initial atrial lead P- waves measured 2.23m with impedance of 545 ohms and a threshold of 0.3 V at 0.5 msec.  Right ventricular lead R-waves measured 8 mV with an impedance of 1024 ohms and a threshold of 0.5 V at 0.5 msec.  Both leads were secured to the pectoralis fascia using #2-0 silk over the suture sleeves.   Device Placement:  The leads were then connected to a Medtronic Azure XT DR MRI SureScan (serial number [id number]) pacemaker.  The pocket was irrigated with copious gentamicin solution.  The pacemaker was then placed into the pocket.  The pocket was then closed in 3 layers with 2.0 Vicryl suture for the subcutaneous and 3.0 Vicryl suture subcuticular layers.  Steri-Strips and a sterile dressing were then applied. EBL<112m There were no early apparent complications.     CONCLUSIONS:   1. Successful implantation of a Medtronic Azure XT DR MRI SureScan dual-chamber pacemaker for symptomatic bradycardia  2. No early apparent complications.            _____________   History of Present Illness  [name] is a 69 y.o. female with PACs, HTN, DM-1, HLD, throid cancer with thyroidectomy, CVA in 2016, and GERD.  Acute CVA in 2019 loop recorder was placed with + atrial ectopy on tele thought to be a fib.  She was on BB and found to have significant bradycardia and 2:1 block as well as CHB. HR was 40s to 50s.    Even after stopping BB her HR was in 40s to  50s.  Loop recorder with arrhythmias then in office was CHB as well.  She had symptoms of weakness and fatigue.  She agreed to pacer implant.  Her Eliquis for A fib was held.    She was evaluated by Dr. Curt Bears and plans were made for [facility] and loop recorder removal.    Hospital Course     Consultants: none  She presented 06/25/20 and had PPM Medtronic Azure XT DR MRI SureScan dual-chamber pacemaker for symptomatic bradycardia with 2:1 and CHB.  She had loop recorder removed.   Today she was seen and evaluated by Dr. Rayann Heman and found stable for discharge.  Her Eliquis had been resumed and her outpt meds.  Her pacer site was stable with no bleeding and CXR with no pneumothorax and device interrogation personally reviewed by Dr. Rayann Heman.   She will follow up as instructed and pacemaker instructions reviewed.     Did the patient have an acute coronary syndrome (MI, NSTEMI, STEMI, etc) this admission?:  No                               Did the patient have a percutaneous coronary intervention (stent / angioplasty)?:  No.   _____________  Discharge Vitals Blood pressure (!) 117/54, pulse 68, temperature 98.7 F (37.1 C), temperature source Oral, resp. rate 18, height (!) 5.7" (0.145 m), weight 106.9 kg, SpO2 95 %.  Filed Weights   06/25/20 1007 06/25/20 1901 06/26/20 0103  Weight: 108.4 kg 114.2 kg 106.9 kg    Labs & Radiologic Studies    CBC Recent Labs    06/25/20 1049  WBC 5.0  HGB 10.7*  HCT 35.2*  MCV 93.4  PLT 846   Basic Metabolic Panel Recent Labs    06/25/20 1131  NA 142  K 3.6  CL 115*  CO2 18*  GLUCOSE 171*  BUN 9  CREATININE 0.75  CALCIUM 6.9*   Liver Function Tests No results for input(s): AST, ALT, ALKPHOS, BILITOT, PROT, ALBUMIN in the last 72 hours. No results for input(s): LIPASE, AMYLASE in the last 72 hours. High Sensitivity Troponin:   No results for input(s): TROPONINIHS in the last 720 hours.  BNP Invalid input(s): POCBNP D-Dimer No results for  input(s): DDIMER in the last 72 hours. Hemoglobin A1C Recent Labs    06/25/20 1049  HGBA1C 8.6*   Fasting Lipid Panel No results for input(s): CHOL, HDL, LDLCALC, TRIG, CHOLHDL, LDLDIRECT in the last 72 hours. Thyroid Function Tests No results for input(s): TSH, T4TOTAL, T3FREE, THYROIDAB in the last 72 hours.  Invalid input(s): FREET3 _____________  DG Chest 2 View  Result Date: 06/26/2020 CLINICAL DATA:  69 year old female status post pacemaker placement. EXAM: CHEST - 2 VIEW COMPARISON:  Chest x-ray 03/07/2020. FINDINGS: New left-sided pacemaker device in place with lead tips projecting over the expected location of the right atrium and right ventricle. Lung volumes are normal. No consolidative airspace disease. No pleural effusions. No pneumothorax. No pulmonary nodule  or mass noted. No evidence of pulmonary edema. Mild cardiomegaly. Upper mediastinal contours are within normal limits. Atherosclerosis in the thoracic aorta. IMPRESSION: 1. New pacemaker device in place, without evidence of pneumothorax or other complicating features. 2. Mild cardiomegaly. 3. Aortic atherosclerosis. Electronically Signed   By: Vinnie Langton M.D.   On: 06/26/2020 09:45   EP PPM/ICD IMPLANT  Result Date: 06/25/2020 SURGEON:  Will Meredith Leeds, MD   PREPROCEDURE DIAGNOSIS:  Second degree AV block   POSTPROCEDURE DIAGNOSIS:  Second degree AV block    PROCEDURES:  1. Left upper extremity venography.  2. Pacemaker implantation.   INTRODUCTION: [name] is a 69 y.o. female  with a history of bradycardia who presents today for pacemaker implantation.  The patient reports intermittent episodes of dizziness over the past few months.  No reversible causes have been identified.  The patient therefore presents today for pacemaker implantation.   DESCRIPTION OF PROCEDURE:  Informed written consent was obtained, and  the patient was brought to the electrophysiology lab in a fasting state.  The patient  required no sedation for the procedure today.  The patients left chest was prepped and draped in the usual sterile fashion by the EP lab staff. The skin overlying the left deltopectoral region was infiltrated with lidocaine for local analgesia.  A 4-cm incision was made over the left deltopectoral region.  A left subcutaneous pacemaker pocket was fashioned using a combination of sharp and blunt dissection. Electrocautery was required to assure hemostasis.  Left Upper Extremity Venography: A venogram of the left upper extremity was performed, which revealed a large left cephalic vein, which emptied into a large left subclavian vein.  The left axillary vein was moderate in size.  RA/RV Lead Placement: The left axillary vein was cannulated.  Through the left axillary vein, a Medtronic model 5076 (serial number [id number] [id number]) right atrial lead and a Medtronic model 5076 (serial number [id number] [id number]) right ventricular lead were advanced with fluoroscopic visualization into the right atrial appendage and right ventricular apex positions respectively.  Initial atrial lead P- waves measured 2.50m with impedance of 545 ohms and a threshold of 0.3 V at 0.5 msec.  Right ventricular lead R-waves measured 8 mV with an impedance of 1024 ohms and a threshold of 0.5 V at 0.5 msec.  Both leads were secured to the pectoralis fascia using #2-0 silk over the suture sleeves. Device Placement:  The leads were then connected to a Medtronic Azure XT DR MRI SureScan (serial number [id number]) pacemaker.  The pocket was irrigated with copious gentamicin solution.  The pacemaker was then placed into the pocket.  The pocket was then closed in 3 layers with 2.0 Vicryl suture for the subcutaneous and 3.0 Vicryl suture subcuticular layers.  Steri-Strips and a sterile dressing were then applied. EBL<122m There were no early apparent complications.   CONCLUSIONS:  1. Successful implantation of a Medtronic Azure XT DR MRI SureScan dual-chamber  pacemaker for symptomatic bradycardia  2. No early apparent complications.       Will MaMeredith LeedsMD 06/25/2020 2:50 PM  CUP PACEART INCLINIC DEVICE CHECK  Result Date: 06/15/2020 Loop check in clinic. Battery status: good. R-waves 0.90 mV. 0 symptom episodes, 0 tachy episodes, 3 pause episodes, 0 brady episodes. 0 AF episodes (<0.1% burden). Monthly summary reports and ROV with WC. 3 pause episodes < 3 seconds.LeLavenia AtlasBSN, RN  CUP PACEART REMOTE DEVICE CHECK  Result Date: 05/28/2020 Carelink summary report received. Battery status  OK. Normal device function. No new symptom episodes, tachy episodes, brady, or pause episodes. No new AF episodes. Monthly summary reports and ROV/PRN [place]  Disposition   Pt is being discharged home today in good condition.  Follow-up Plans & Appointments     Follow-up Information    [place] [facility] Follow up.   Specialty: Cardiology Why: 07/08/2020 @ 11:30AM, wound check visit Contact information: [address], [address] [place]       Constance Haw, MD Follow up.   Specialty: Cardiology Why: 10/07/2020 @ 1:45PM Contact information: [place] [address] [phone]                Discharge Medications   Allergies as of 06/26/2020      Reactions   Latex Rash      Medication List    TAKE these medications   Accu-Chek Guide test strip Generic drug: glucose blood   Accu-Chek Guide w/Device Kit   acetaminophen 500 MG tablet Commonly known as: TYLENOL Take 1,000 mg by mouth every 8 (eight) hours as needed for mild pain.   amLODipine 5 MG tablet Commonly known as: NORVASC Take 1 tablet (5 mg total) by mouth daily.   apixaban 5 MG Tabs tablet Commonly known as: Eliquis Take 1 tablet (5 mg total) by mouth 2 (two) times daily.   atorvastatin 80 MG tablet Commonly known as: LIPITOR Take 1 tablet (80 mg total) by mouth every  evening.   CRANBERRY EXTRACT PO Take 1 capsule by mouth daily.   Dexcom G6 Transmitter Misc by Does not apply route. Checks her blood sugar   fexofenadine 180 MG tablet Commonly known as: ALLEGRA Take 180 mg by mouth daily as needed for allergies or rhinitis.   FLUoxetine 20 MG tablet Commonly known as: PROZAC Take 20 mg by mouth daily. Take with the 10 mg dose to equal 30 mg daily   FLUoxetine 10 MG capsule Commonly known as: PROZAC Take 10 mg by mouth daily. Take with the 20 mg dose to equal 30 mg daily   gabapentin 300 MG capsule Commonly known as: NEURONTIN TAKE ONE CAPSULE BY MOUTH AT BEDTIME   insulin aspart 100 UNIT/ML injection Commonly known as: NovoLOG Inject 20-30 Units into the skin daily. To be used in patient's insulin pump   insulin pump Soln Inject 1 each into the skin continuous. Novolog insulin   levothyroxine 175 MCG tablet Commonly known as: SYNTHROID Take 175 mcg by mouth daily before breakfast.   loperamide 2 MG tablet Commonly known as: IMODIUM A-D Take 1 tablet (2 mg total) by mouth 4 (four) times daily as needed for diarrhea or loose stools.   losartan 100 MG tablet Commonly known as: COZAAR Take 1 tablet (100 mg total) by mouth daily.   OCUVITE PO Take 1 tablet by mouth daily.   ondansetron 4 MG disintegrating tablet Commonly known as: ZOFRAN-ODT Take 4 mg by mouth every 8 (eight) hours as needed for nausea or vomiting.   pantoprazole 40 MG tablet Commonly known as: PROTONIX Take 1 tablet (40 mg total) by mouth daily.   potassium chloride 10 MEQ tablet Commonly known as: KLOR-CON Take 1 tablet (10 mEq total) by mouth daily.   torsemide 10 MG tablet Commonly known as: DEMADEX Take 10 mg by mouth every other day.          Outstanding Labs/Studies   none  Duration of Discharge Encounter  Greater than 30 minutes including physician time.  Signed, Cecilie Kicks, NP 06/26/2020, 5:11 PM

## 2020-06-26 NOTE — Progress Notes (Signed)
Doing well s/p PPM  CXR reveals stable leads, no ptx.  Device interrogation is personally reviewed and normal.   DC to home with routine wound care and follow-up  Thompson Grayer MD, [place] 06/26/2020 10:38 AM

## 2020-06-29 ENCOUNTER — Encounter (HOSPITAL_COMMUNITY): Payer: Self-pay | Admitting: Cardiology

## 2020-06-29 MED FILL — Lidocaine HCl Local Inj 1%: INTRAMUSCULAR | Qty: 60 | Status: AC

## 2020-07-01 ENCOUNTER — Ambulatory Visit (INDEPENDENT_AMBULATORY_CARE_PROVIDER_SITE_OTHER): Payer: Medicare PPO | Admitting: *Deleted

## 2020-07-01 DIAGNOSIS — I442 Atrioventricular block, complete: Secondary | ICD-10-CM | POA: Diagnosis not present

## 2020-07-01 LAB — CUP PACEART REMOTE DEVICE CHECK
Date Time Interrogation Session: 20210707230812
Implantable Pulse Generator Implant Date: 20191230

## 2020-07-02 ENCOUNTER — Encounter: Payer: Self-pay | Admitting: Family Medicine

## 2020-07-05 NOTE — Progress Notes (Signed)
[facility] Summary Report / Loop Recorder 

## 2020-07-08 ENCOUNTER — Ambulatory Visit (INDEPENDENT_AMBULATORY_CARE_PROVIDER_SITE_OTHER): Payer: Medicare PPO | Admitting: Emergency Medicine

## 2020-07-08 ENCOUNTER — Other Ambulatory Visit: Payer: Self-pay

## 2020-07-08 DIAGNOSIS — R001 Bradycardia, unspecified: Secondary | ICD-10-CM

## 2020-07-08 DIAGNOSIS — G4733 Obstructive sleep apnea (adult) (pediatric): Secondary | ICD-10-CM | POA: Diagnosis not present

## 2020-07-08 LAB — CUP PACEART INCLINIC DEVICE CHECK
Brady Statistic RA Percent Paced: 23 %
Brady Statistic RV Percent Paced: 18.1 %
Date Time Interrogation Session: 20210715135434
Implantable Lead Implant Date: 20210702
Implantable Lead Implant Date: 20210702
Implantable Lead Location: 753859
Implantable Lead Location: 753860
Implantable Lead Model: 5076
Implantable Lead Model: 5076
Implantable Pulse Generator Implant Date: 20210702
Lead Channel Pacing Threshold Amplitude: 0.75 V
Lead Channel Pacing Threshold Amplitude: 1 V
Lead Channel Pacing Threshold Pulse Width: 0.4 ms
Lead Channel Pacing Threshold Pulse Width: 0.4 ms
Lead Channel Sensing Intrinsic Amplitude: 10.9 mV
Lead Channel Sensing Intrinsic Amplitude: 2.3 mV

## 2020-07-08 NOTE — Progress Notes (Signed)
Wound check appointment. Steri-strips removed. Wound without redness or edema. Incision edges approximated, wound well healed. Normal device function. Thresholds, sensing, and impedances consistent with implant measurements. Device programmed at 3.5V/auto capture programmed on for extra safety margin until 3 month visit. Histogram distribution appropriate for patient and level of activity. Patient currently in motorized wheelchair. No mode switches. 1 NSVT < 0.1 seconds  Patient educated about wound care, arm mobility, lifting restrictions. ROV in 3 months with implanting physician. Next home remote 10/07/20.  ILR wound check in clinic. Steri strips removed prior to OV. Wound appears oval in shape with open hole.  Serous drainage noted. Dr. Lovena Le in to assess wound and infomred patient to keep wound clean, dry, and wash with soap, warm water and clean wash cloth. Questions answered. Patient advised to call DC back if she notes redness, swelling, increased amount in size of wound, increased amount of drainage, fever or chills. Verbalizes understanding.

## 2020-07-08 NOTE — Patient Instructions (Signed)
Please wash site with warm, soap and water with a  Clean wash cloth. Please call clinic if you see increased swelling, redness, drainage, fever or chills.   Device Clinic Phone Number: [phone].

## 2020-07-09 DIAGNOSIS — G4733 Obstructive sleep apnea (adult) (pediatric): Secondary | ICD-10-CM | POA: Diagnosis not present

## 2020-07-10 ENCOUNTER — Encounter: Payer: Self-pay | Admitting: Family Medicine

## 2020-07-11 IMAGING — DX DG CHEST 2V
2 series · 2 of 2 positions shown · non-contrast
Comparison: 10/14/2018

CLINICAL DATA: Chest pain and indigestion starting yesterday.

EXAM:
CHEST - 2 VIEW

[x chest ap]
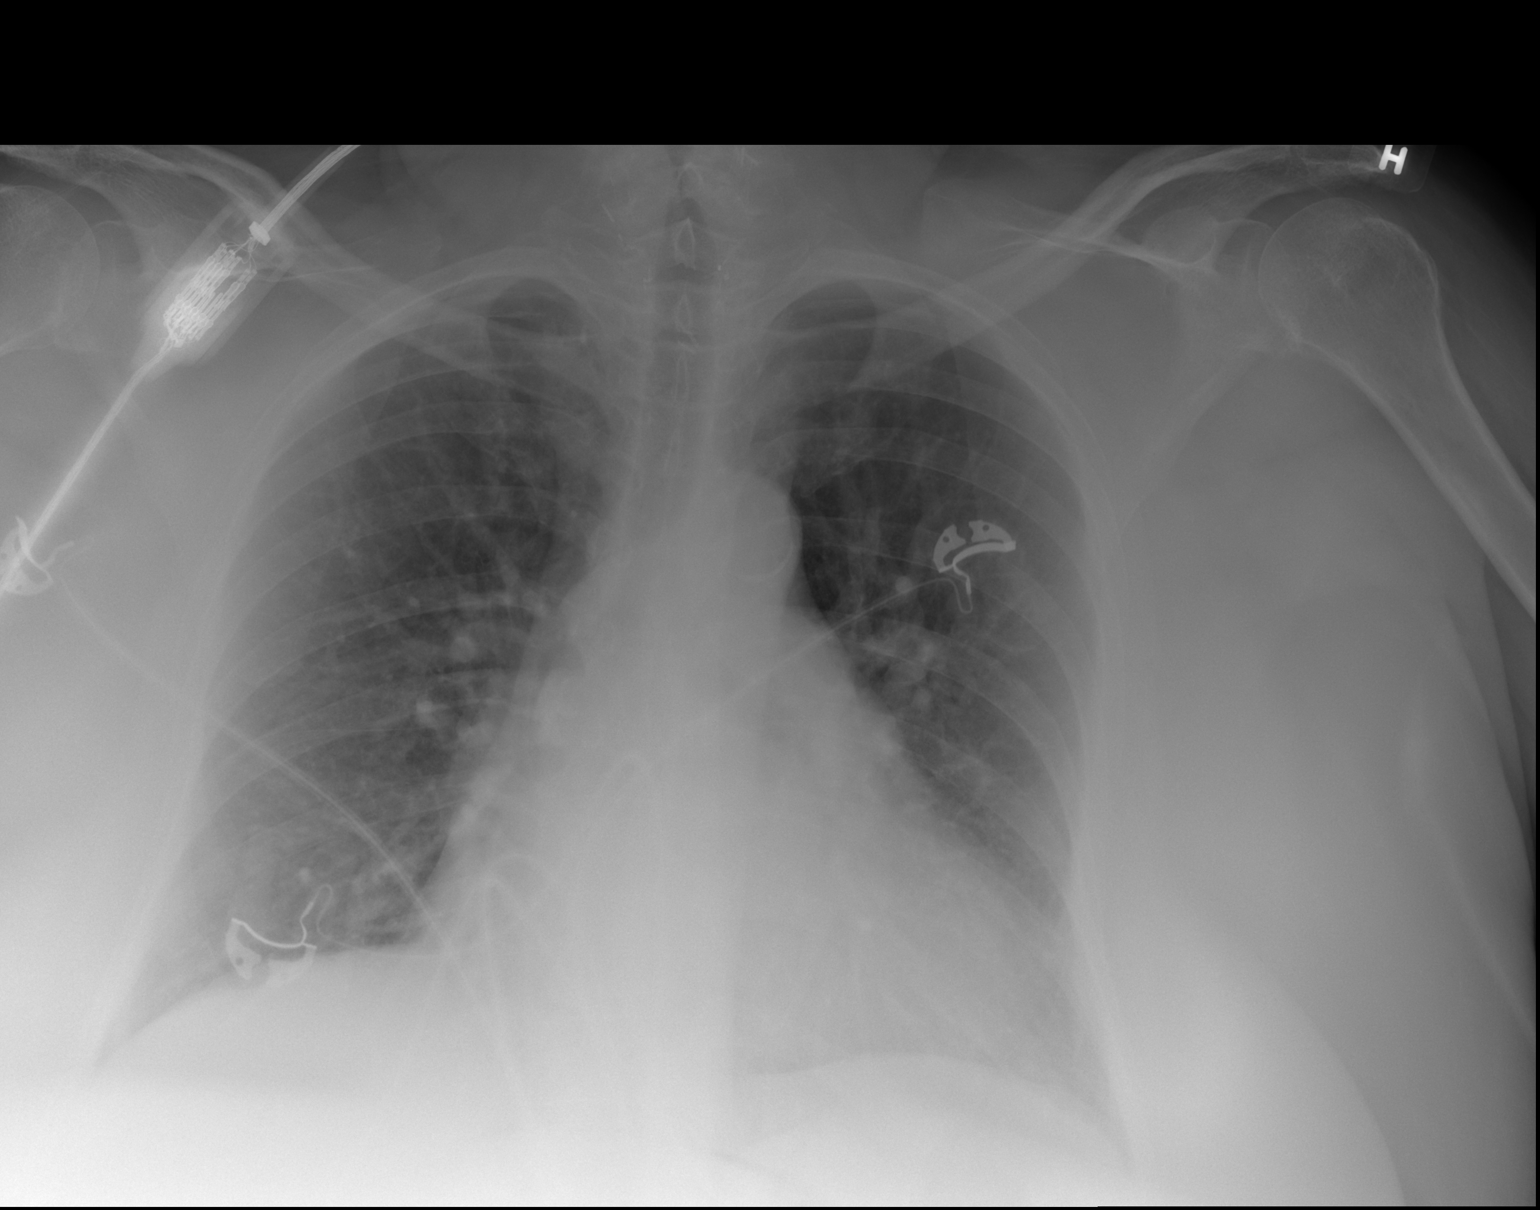

[w chest lat]
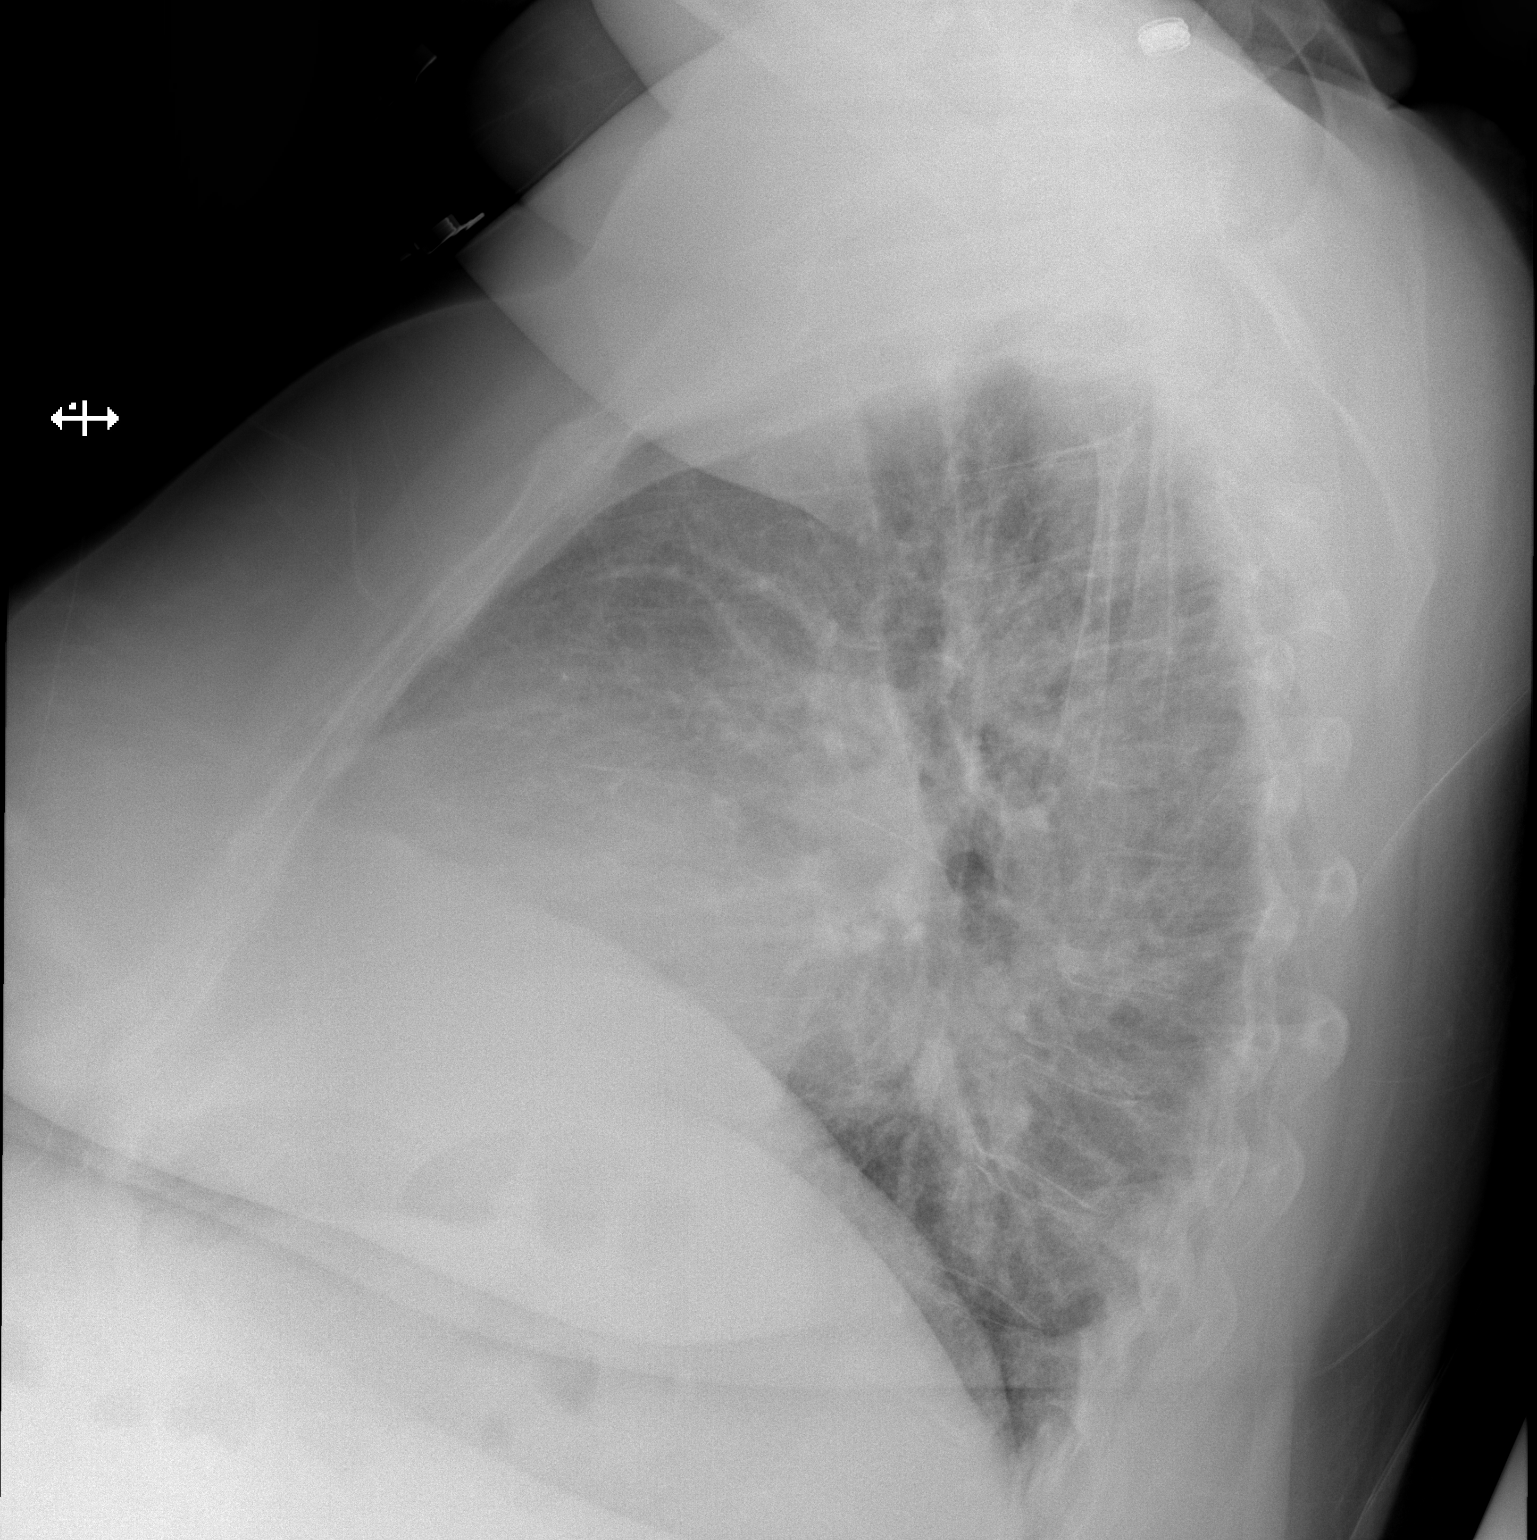

[2 of 2 positions shown; findings below may reference images not displayed]

FINDINGS: Atherosclerotic calcification of the aortic arch. Thoracic
spondylosis. Mild cardiomegaly. Upper zone pulmonary vascular
prominence suggesting pulmonary venous hypertension. No overt edema.
The lungs appear otherwise clear. No pleural effusion identified.
IMPRESSION: 1. Mild enlargement of the cardiopericardial silhouette with
pulmonary venous hypertension but no overt edema. Similar to prior.
2.  Aortic Atherosclerosis ([id number]).

## 2020-07-12 ENCOUNTER — Encounter: Payer: Self-pay | Admitting: Family Medicine

## 2020-07-12 ENCOUNTER — Other Ambulatory Visit: Payer: Self-pay | Admitting: Family Medicine

## 2020-07-13 ENCOUNTER — Telehealth: Payer: Self-pay | Admitting: Family Medicine

## 2020-07-13 ENCOUNTER — Ambulatory Visit: Payer: Self-pay | Admitting: Licensed Clinical Social Worker

## 2020-07-13 DIAGNOSIS — Z7189 Other specified counseling: Secondary | ICD-10-CM

## 2020-07-13 NOTE — Chronic Care Management (AMB) (Signed)
Care Management   Clinical Social Work Follow Up   07/13/2020 Name: [name] MRN: [id number] DOB: 25-Mar-1951 Referred by: [name] Foster, MD  Reason for referral : Care Coordination (transportation concerns)  [name] is a 69 y.o. year old female who is a primary care patient of [name], Mak[place], MD.  Reason for follow-up: assess for barriers and progress with transportation barriers .   Assessment: Patient continues to experience difficulty with transportation. Waiting for approval of SCAT. See care plan below with details  Plan:  1.  Family will assist with transportation needs until SCAT is approved 2.  Patient will  Call LCSW if she encounters barriers with getting to scheduled appointments before SCAT is approved 3. No F/U scheduled Advance Directive Status:  not addressed during this encounter.  SDOH (Social Determinants of Health) assessments performed:  No needs identified   Goals Addressed            This Visit's Progress   . Barriers with Transportation       CARE PLAN ENTRY (see longitudinal plan of care for additional care plan information)  Current Barriers & Progress:  . Patient that is wheelchair dependent needs transportation to medical appointments,  . Acknowledges deficits and needs support, education and care coordination in order to meet this unmet need  . PCP completed her part on SCAT application, application faxed by [name] front office staff . Barrier with pre auth from insurance provider for none emergency transportation ( RN clinic not successful with obtaining information to assist with auth after call intake number provider. Marland Kitchen SCAT application and the missing last page was re-faxed to to SCAT attention [name] ( two times) Clinical Goal(s):  Over the next 20 days, patient will receive transportation and be able to safely  travel without fear of missing appointments Interventions: . Assessed  patient needs, what she has used in the past and how currently meeting needs . Provided patient with update on barriers with transportation with her insurance provider . Collaborated with, [name] RN clinic regarding patient needs . E-mail sent to SCAT to confirm that they have all needed information to process patient's application . Other interventions: emotional support  Patient Self Care Activities: . Patient is unable to independently navigate obtaining auth for transportation without care coordination support  . Patient's family will transport her to next two appointments until other options are resolved. Please see past updates related to this goal by clicking on the "Past Updates" button in the selected goal         Outpatient Encounter Medications as of 07/13/2020  Medication Sig  . ACCU-CHEK GUIDE test strip   . acetaminophen (TYLENOL) 500 MG tablet Take 1,000 mg by mouth every 8 (eight) hours as needed for mild pain.   Marland Kitchen amLODipine (NORVASC) 5 MG tablet Take 1 tablet (5 mg total) by mouth daily.  Marland Kitchen apixaban (ELIQUIS) 5 MG TABS tablet Take 1 tablet (5 mg total) by mouth 2 (two) times daily.  Marland Kitchen atorvastatin (LIPITOR) 80 MG tablet Take 1 tablet (80 mg total) by mouth every evening.  . Blood Glucose Monitoring Suppl (ACCU-CHEK GUIDE) w/Device KIT   . Continuous Blood Gluc Transmit (DEXCOM G6 TRANSMITTER) MISC by Does not apply route. Checks her blood sugar  . CRANBERRY EXTRACT PO Take 1 capsule by mouth daily.  . fexofenadine (ALLEGRA) 180 MG tablet Take 180 mg by mouth daily as needed for allergies or rhinitis.  Marland Kitchen FLUoxetine (PROZAC) 10 MG capsule  Take 10 mg by mouth daily. Take with the 20 mg dose to equal 30 mg daily  . FLUoxetine (PROZAC) 20 MG tablet Take 20 mg by mouth daily. Take with the 10 mg dose to equal 30 mg daily  . gabapentin (NEURONTIN) 300 MG capsule TAKE ONE CAPSULE BY MOUTH AT BEDTIME (Patient taking differently: Take 300 mg by mouth at bedtime. )  . insulin aspart  (NOVOLOG) 100 UNIT/ML injection Inject 20-30 Units into the skin daily. To be used in patient's insulin pump  . Insulin Human (INSULIN PUMP) SOLN Inject 1 each into the skin continuous. Novolog insulin  . levothyroxine (SYNTHROID, LEVOTHROID) 175 MCG tablet Take 175 mcg by mouth daily before breakfast.   . loperamide (IMODIUM A-D) 2 MG tablet Take 1 tablet (2 mg total) by mouth 4 (four) times daily as needed for diarrhea or loose stools.  Marland Kitchen losartan (COZAAR) 100 MG tablet Take 1 tablet (100 mg total) by mouth daily.  . Multiple Vitamins-Minerals (OCUVITE PO) Take 1 tablet by mouth daily.  . ondansetron (ZOFRAN-ODT) 4 MG disintegrating tablet Take 4 mg by mouth every 8 (eight) hours as needed for nausea or vomiting.  . pantoprazole (PROTONIX) 40 MG tablet Take 1 tablet (40 mg total) by mouth daily.  . potassium chloride (KLOR-CON) 10 MEQ tablet Take 1 tablet (10 mEq total) by mouth daily.  Marland Kitchen torsemide (DEMADEX) 10 MG tablet TAKE 1 TABLET(10 MG) BY MOUTH DAILY AS NEEDED  . [DISCONTINUED] losartan (COZAAR) 25 MG tablet Take 1 tablet (25 mg total) by mouth at bedtime.  . [DISCONTINUED] ondansetron (ZOFRAN-ODT) 4 MG disintegrating tablet DISSOLVE ONE TABLET BY MOUTH EVERY 8 HOURS AS NEEDED FOR NAUSEA AND VOMITING  . [DISCONTINUED] pantoprazole (PROTONIX) 40 MG tablet Take 1 tablet (40 mg total) by mouth daily.   No facility-administered encounter medications on file as of 07/13/2020.   Review of patient status, including review of consultants reports, relevant laboratory and other test results, and collaboration with appropriate care team members and the patient's provider was performed as part of comprehensive patient evaluation and provision of care management services.    [name], [place] / [place]   [phone] 11:45 AM

## 2020-07-13 NOTE — Telephone Encounter (Signed)
Patient is requesting to have a virtual appointment due to not being approved by SCAT yet. So if they doctor is okay with her having a virtual please let me know or call patient to set up virtual appointment. Thanks

## 2020-07-16 ENCOUNTER — Telehealth: Payer: Self-pay

## 2020-07-16 NOTE — Telephone Encounter (Signed)
Patient called in stating that she needs a new monitor and we called tech support and they are going to send her a new monitor and they will expedite the order. Patient will call when she gets the new monitor

## 2020-07-20 ENCOUNTER — Telehealth: Payer: Self-pay | Admitting: Cardiology

## 2020-07-20 NOTE — Telephone Encounter (Signed)
Called patient about her message. Patient started having questions about her device. Patient stated that Mcarthur Rossetti is saying she is not registered, patient would like this to be fixed. Not sure what patient is talking about. Asked patient about hiccups. Patient stated she never had hiccups before, but now she has them and thinks it's related to her heart. Informed patient that hiccups can occur for number of reasons. Patient denied chest pain and SOB at this time. Will forward to [facility].

## 2020-07-20 NOTE — Telephone Encounter (Signed)
Spoke with pt, she was concerned about her device not transmitting.  Confirmed in [employer] that new monitor was mailed to patient on 7/26.  Advised pt once she receives monitor to plug it in and then call us for assistance with transmission.  Provided patient with DC phone #.

## 2020-07-20 NOTE — Telephone Encounter (Signed)
New message:    Please call, pt says she is having hiccups., not sure if is heart related.

## 2020-07-20 NOTE — Telephone Encounter (Signed)
Patient contacted me to request help. I've confirmed with the device team that her new device was shipped out and should arrive no later than tomorrow.   I have left [name] a voicemail on the phone number she asked me to call [phone]). I let her know in order for [place] to confirm if her hiccups are related to her new device, she will need to call us so that we can help her hook up the new device as soon as she receives it so that we can best evaluate any correlation.

## 2020-07-21 ENCOUNTER — Telehealth: Payer: Self-pay

## 2020-07-21 NOTE — Telephone Encounter (Signed)
The pt got the transmission to come thru. Transmission received.

## 2020-07-21 NOTE — Telephone Encounter (Signed)
The pt Serial number, birthday and name was wrong in [employer]. I was on the phone for an hour trying to fix it. Tomi Bamberger is coming to help me figure out how to fix it. I told the pt that I will give her a call back to let her know.

## 2020-07-21 NOTE — Telephone Encounter (Signed)
LMOVM for pt to know I did get someone to change her serial number to the correct serial number in [employer].

## 2020-07-22 ENCOUNTER — Telehealth: Payer: Self-pay | Admitting: Emergency Medicine

## 2020-07-22 NOTE — Telephone Encounter (Signed)
LMOM per DPR that unscheduled remote was sent and to call if she is having any problems. Device clinic # and office hours provided.

## 2020-07-23 ENCOUNTER — Encounter: Payer: Self-pay | Admitting: Family Medicine

## 2020-07-23 ENCOUNTER — Telehealth (INDEPENDENT_AMBULATORY_CARE_PROVIDER_SITE_OTHER): Payer: Medicare PPO | Admitting: Family Medicine

## 2020-07-23 DIAGNOSIS — G8929 Other chronic pain: Secondary | ICD-10-CM | POA: Diagnosis not present

## 2020-07-23 DIAGNOSIS — M545 Low back pain: Secondary | ICD-10-CM

## 2020-07-23 MED ORDER — GABAPENTIN 300 MG PO CAPS: 300.0000 mg | ORAL_CAPSULE | Freq: Three times a day (TID) | ORAL | 2 refills | Status: DC

## 2020-07-23 NOTE — Telephone Encounter (Signed)
Left message for pt to follow-up.  Transmission has been received and reviewed, no concerns.  Noted in message that if pt still having issue with hiccups or concerns to call office, device clinic # provided.

## 2020-07-23 NOTE — Progress Notes (Signed)
No vitals taken at home.  .Adaleigh Warf R Olamide Carattini, CMA  

## 2020-07-23 NOTE — Progress Notes (Signed)
[facility] Visit  Patient consented to have virtual visit and was identified by name and date of birth. Method of visit: Telephone  Encounter participants: Patient: [name] - located at home address  Provider: Eulis Foster - located at [facility]   Chief Complaint: recently fell and hit back of head   HPI:  Patient reports she was attempting to get to the restroom using her roll aid and thinks she tripped on the flooring causing her to  hit the back part of her skull. Patient states that she feels fine as far as her head, denies currently feeling dizziness or visual changes, patient demonstrates normal orientation. Patient reports she is having more back pain in her lower back. She is requesting to have increased gabapentin to help with her lower back pain. Denies radiculopathy symptoms, feels achy; denies any urinary or bladder incontinence. Severity rated as 7/10. She reports that the Gabapentin is helping and she would like to increase it if possible.Tried tylenol, helps for short amount of time, takes 1000 mg once daily.   Patient requests to have home health aid. She reports that she is alone in the house most of the day. She currently has her 15 year old niece but she will be returning to school soon and patient needs   Wants help with medication management, and is currently followed by chronic care management.  Does not need help with bathing, her sister currently helps with that. Patient lives alone and sister frequently visits.   ROS: per HPI  Pertinent PMHx:   Exam:  Patient was unable to measure vitals for this virtual visit.  Respiratory: speaking in clear sentences, no signs of respiratory distress  Correctly answers orientation questions  Assessment/Plan:  Chronic lower back pain Patient's gabapentin frequency increased.  Continue conservative measures to help with aches following fall  Red flag precautions  given and patient verbalized understanding  Patient having difficulty with transportation and awaiting approval for SCAT     Time spent during visit with patient: 17 minutes

## 2020-07-25 DIAGNOSIS — R296 Repeated falls: Secondary | ICD-10-CM | POA: Diagnosis not present

## 2020-07-27 ENCOUNTER — Encounter: Payer: Self-pay | Admitting: Family Medicine

## 2020-07-27 DIAGNOSIS — M545 Low back pain, unspecified: Secondary | ICD-10-CM | POA: Insufficient documentation

## 2020-07-27 DIAGNOSIS — G8929 Other chronic pain: Secondary | ICD-10-CM | POA: Insufficient documentation

## 2020-07-27 NOTE — Assessment & Plan Note (Signed)
Patient's gabapentin frequency increased.  Continue conservative measures to help with aches following fall  Red flag precautions given and patient verbalized understanding  Patient having difficulty with transportation and awaiting approval for SCAT

## 2020-07-28 ENCOUNTER — Encounter: Payer: Self-pay | Admitting: Family Medicine

## 2020-07-28 ENCOUNTER — Other Ambulatory Visit: Payer: Self-pay

## 2020-07-28 ENCOUNTER — Ambulatory Visit: Payer: Medicare PPO | Admitting: Family Medicine

## 2020-07-28 DIAGNOSIS — W19XXXS Unspecified fall, sequela: Secondary | ICD-10-CM | POA: Diagnosis not present

## 2020-07-28 DIAGNOSIS — I10 Essential (primary) hypertension: Secondary | ICD-10-CM

## 2020-07-28 DIAGNOSIS — M79604 Pain in right leg: Secondary | ICD-10-CM

## 2020-07-28 DIAGNOSIS — I480 Paroxysmal atrial fibrillation: Secondary | ICD-10-CM | POA: Diagnosis not present

## 2020-07-28 DIAGNOSIS — E1159 Type 2 diabetes mellitus with other circulatory complications: Secondary | ICD-10-CM | POA: Diagnosis not present

## 2020-07-28 NOTE — Assessment & Plan Note (Addendum)
-  Denies any recent falls or associated injuries -Back pain from previous fall about 3-4 weeks ago has improved, decreased gabapentin from tid to bid. -Has support at home, discussed safety and continuing to use precaution with moving around to avoid future falls

## 2020-07-28 NOTE — Progress Notes (Signed)
    SUBJECTIVE:   CHIEF COMPLAINT / HPI:  Right leg pain Patient presents to clinic for right leg pain that started about 4-5 days ago. States that it has not gotten worse, but only arises in the morning. She usually takes tylenol in the mornings with the onset of pain and that resolve the pain throughout the day until the next morning. Admits to pain that is localized along the patella and denies radiating pain down the leg. Denies fever and chills. Describes it as a dull, aching pain with an intermittent throbbing sensation along the knee. Denies chest pain, dyspnea, blurred vision and headache. Reports numbness of left leg that has been stable since her stroke. States that she has been taking gabapentin for her back pain but that does not resolve her leg pain. At baseline, patient ambulates using a wheelchair and also uses a roller aide at home. Denies any recent falls and associated injuries. Lives with her 69 year old granddaughter who helps her a lot, as well as her sister.    PERTINENT  PMH / PSH:   Hypertension Denies any chest pain or dyspnea. Compliant on home meds of losartan and amlodipine, reports no complications or adverse effects. BP in office 128/74.  Diabetes Insulin pump in place. Denies any dizziness or hypoglycemic episodes. Follow up regularly with endocrinologist.   History of falls Denies any recent falls, last fall was 3-4 weeks ago when she hit her head. This is when her gabapentin dose increased to three times daily due to her worsening chronic back pain. Denies any complications with gabapentin.  Paroxysmal atrial fibrillation  Compliant on Eliquis.  OBJECTIVE:   BP 128/74   Pulse 74   Ht 5\' 7"  (1.702 m)   Wt 233 lb (105.7 kg)   SpO2 98%   BMI 36.49 kg/m   General: Patient well-appearing, in no acute distress. 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 Likely pain due to arthritis.  -Decrease gabapentin to twice daily -instructed to contact us if pain worsens in character   Hypertension associated with diabetes ([place]) -Continue losartan and amlodipine as prescribed -Continue insulin and follow up regularly with endocrinologist -Continue to monitor for hypoglycemic episodes and altering symptoms   History of Falls with injury -Denies any recent falls or associated injuries -Back pain from previous fall about 3-4 weeks ago has improved, decreased gabapentin from tid to bid. -Has support at home, discussed safety and continuing to use precaution with moving around to avoid future falls   Paroxysmal atrial fibrillation ([place]) -Continue Elliquis       Emitt Maglione Larae Grooms, [place]

## 2020-07-28 NOTE — Assessment & Plan Note (Signed)
-  Continue losartan and amlodipine as prescribed -Continue insulin and follow up regularly with endocrinologist -Continue to monitor for hypoglycemic episodes and altering symptoms

## 2020-07-28 NOTE — Assessment & Plan Note (Signed)
-  Continue Elliquis

## 2020-07-28 NOTE — Assessment & Plan Note (Addendum)
-  Does not appear to be septic and less concern for DVT given the lack of edema and calf discrepancy  -Continue to take tylenol in the mornings to resolve pain -Recommended to try voltaren gel, apply to affected area as needed. Likely pain due to arthritis.  -Decrease gabapentin to twice daily -instructed to contact us if pain worsens in character

## 2020-07-28 NOTE — Patient Instructions (Signed)
It was so great meeting you today!   Today we discussed your right knee pain. I would recommend to continue to take tylenol in the mornings and can use a topical voltaren gel applied to the area as needed as well for additional relief. We can decrease the gabapentin to twice daily since your back pain is improving.  Continue taking your medications for blood pressure, your blood pressure looks great today.   Please plan for a follow up in about 3 months. Please don't hesitate to contact us with any further questions or concerns. Thank you so much for allowing me to be a part of your medical care!

## 2020-07-29 ENCOUNTER — Telehealth: Payer: Self-pay

## 2020-07-29 NOTE — Telephone Encounter (Signed)
The pt wanted to know what was on her transmission. I let her speak with Leigh, rn.

## 2020-07-29 NOTE — Telephone Encounter (Signed)
Spoke to patient. Infomred her she had several episdoes of AF which is normal for her and she is appropriately treated with Eliquis. Patient appreciative.  Advised patient to call DC back if she has any further questions or concerns. Verbalizes understanding.

## 2020-08-02 ENCOUNTER — Other Ambulatory Visit: Payer: Self-pay | Admitting: *Deleted

## 2020-08-02 IMAGING — CT CT ABD-PELV W/ CM
2 of 5 series · 16 of 46 positions shown, 18 images · IV contrast (omnipaque)
Comparison: None.

CLINICAL DATA: Generalized abdominal pain, nausea, and bloating for
2 days.

EXAM:
CT ABDOMEN AND PELVIS WITH CONTRAST
TECHNIQUE: Multidetector CT imaging of the abdomen and pelvis was performed
using the standard protocol following bolus administration of
intravenous contrast.
CONTRAST:  100mL OMNIPAQUE IOHEXOL 300 MG/ML  SOLN

[Series 3: abdomen 5.0 · axial · 0.98mm/px · z∈[+904,+1304]mm · 13 of 94 slices shown, 15 images]
[im 7/94  soft-tissue]
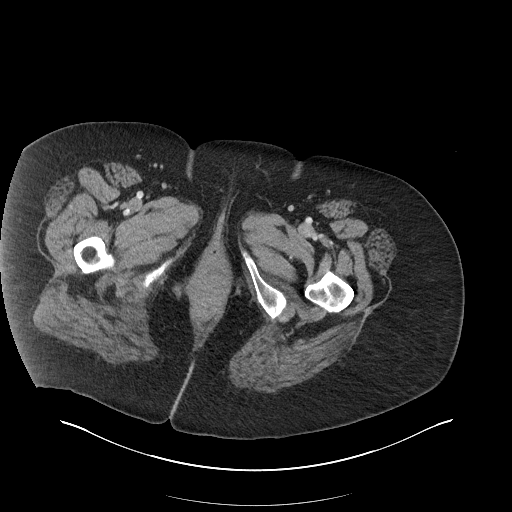
[im 7/94  bone]
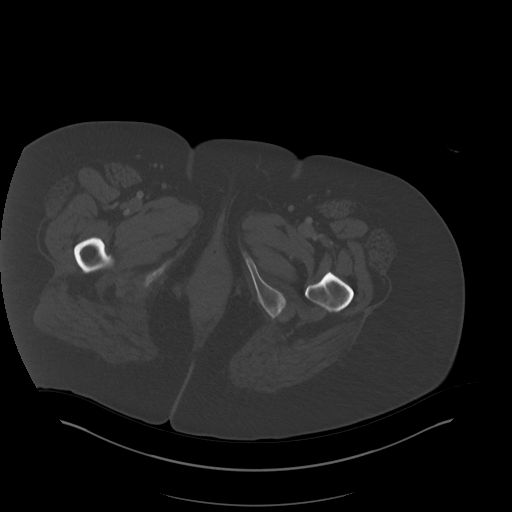
[im 13/94  soft-tissue]
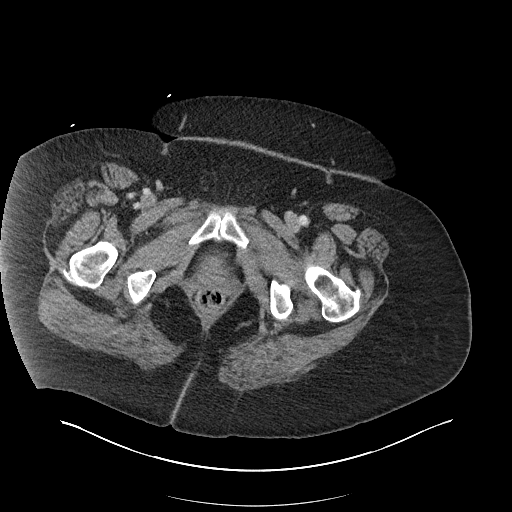
[im 19/94  soft-tissue]
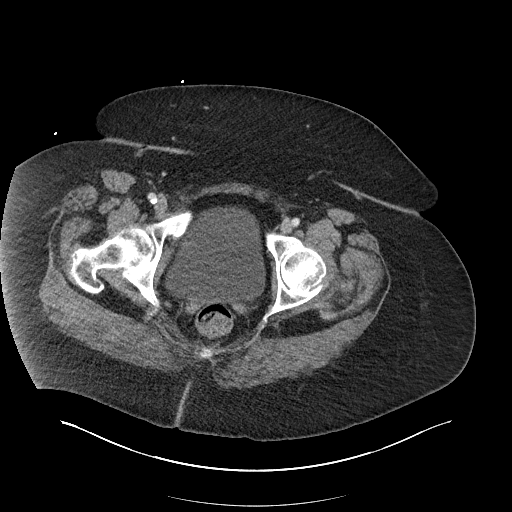
[im 25/94  soft-tissue]
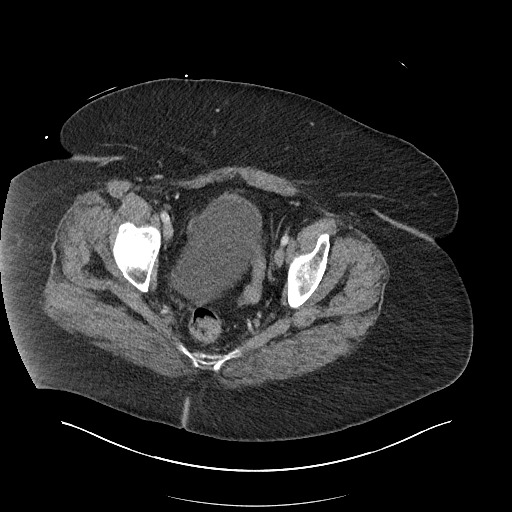
[im 32/94  soft-tissue]
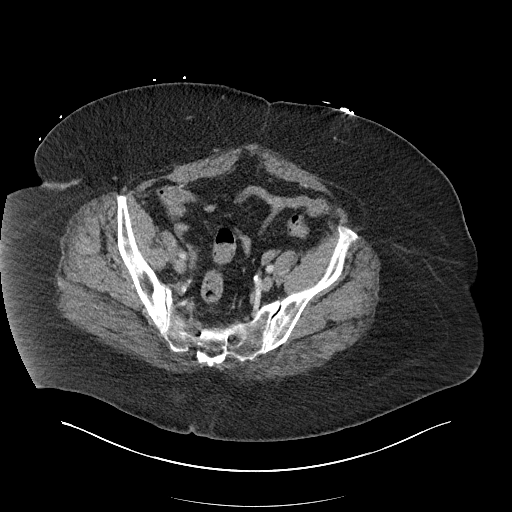
[im 38/94  soft-tissue]
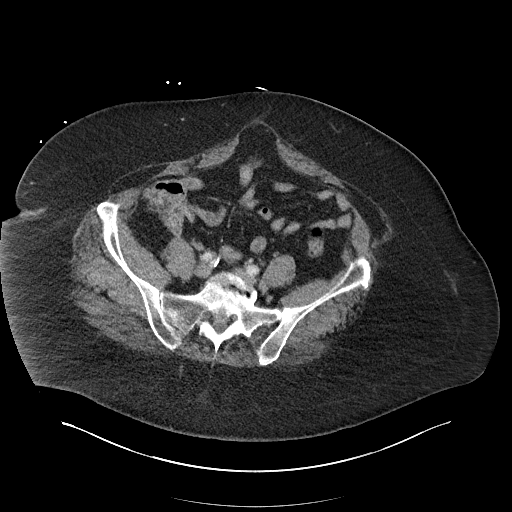
[im 50/94  soft-tissue]
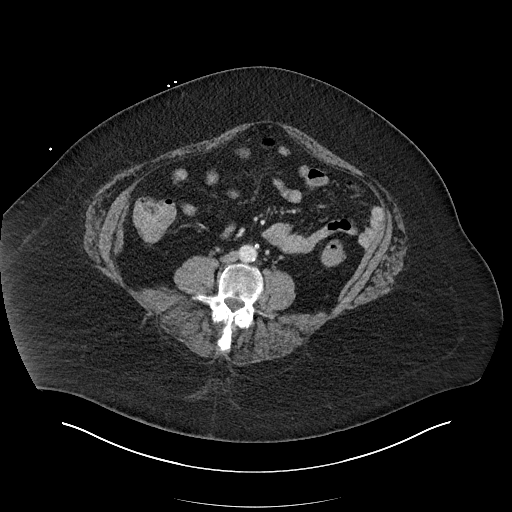
[im 56/94  soft-tissue]
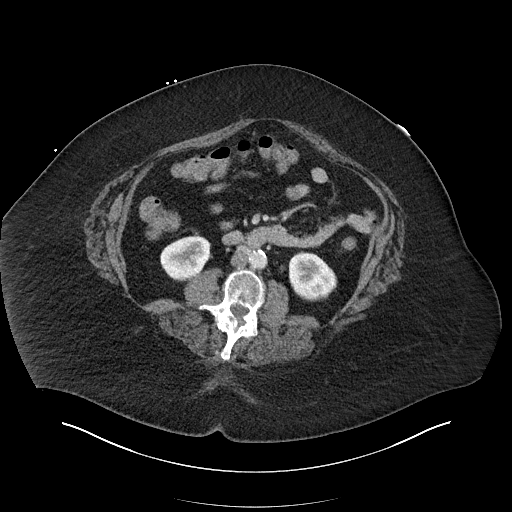
[im 63/94  soft-tissue]
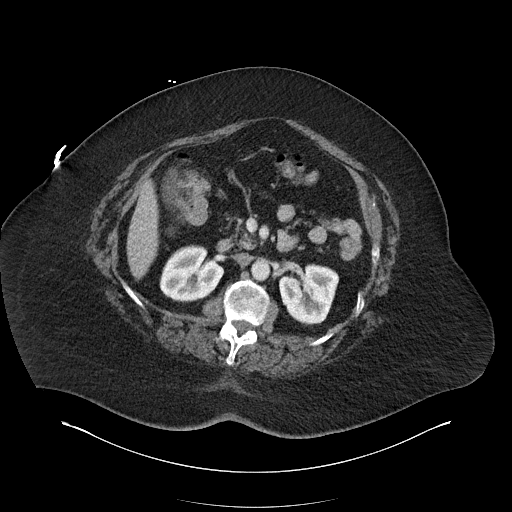
[im 63/94  bone]
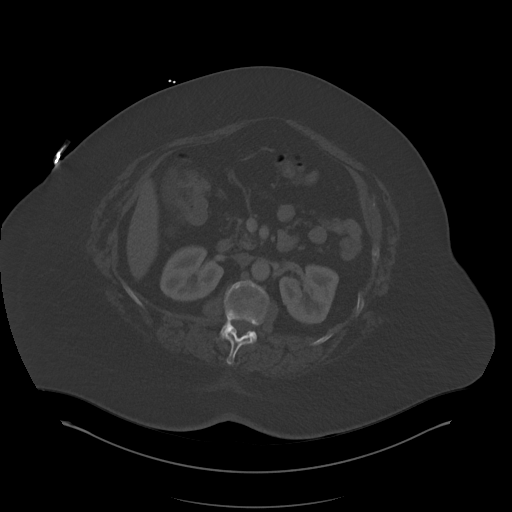
[im 69/94  soft-tissue]
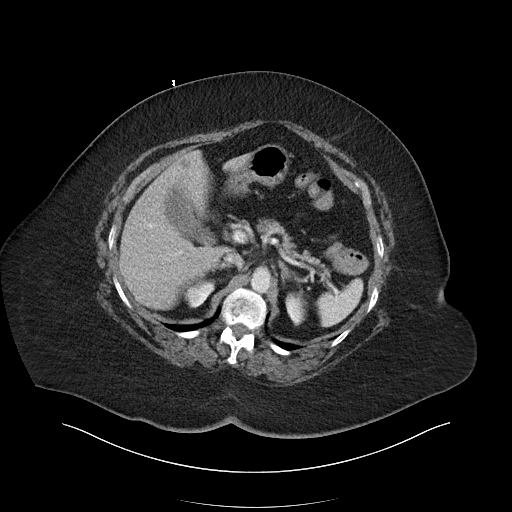
[im 75/94  soft-tissue]
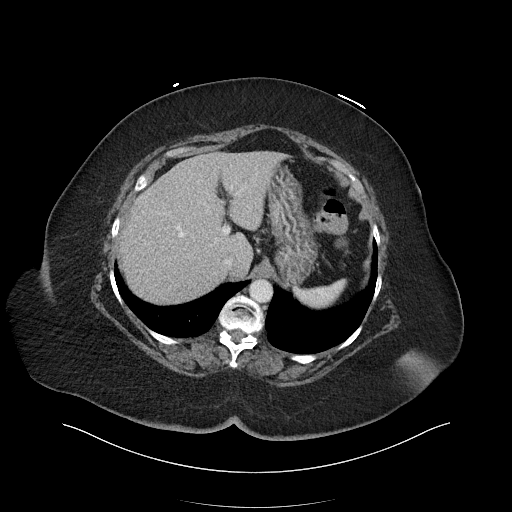
[im 81/94  soft-tissue]
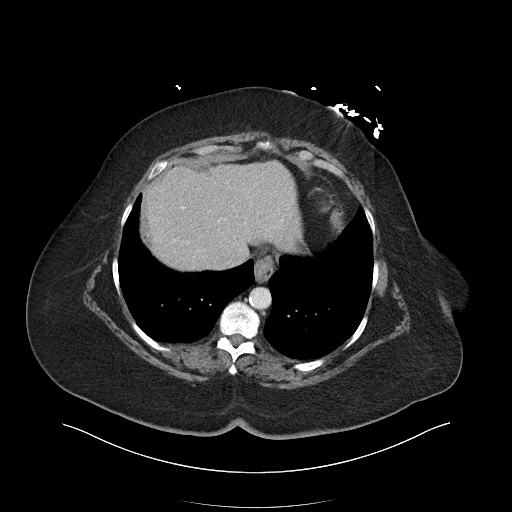
[im 87/94  soft-tissue]
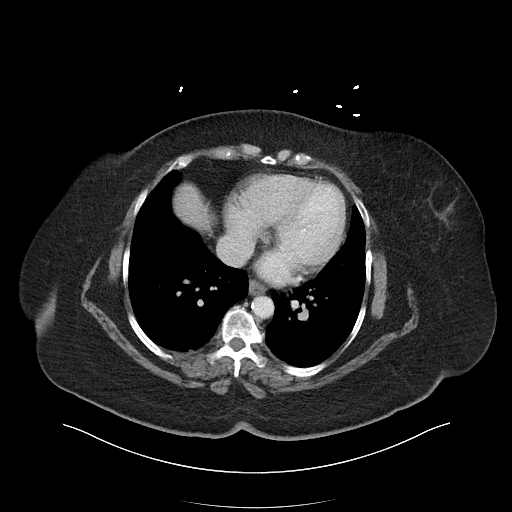

[Series 6: abdomen 3.0 mpr cor · coronal · 0.92mm/px · 3 of 101 slices shown]
[im 34/101  soft-tissue]
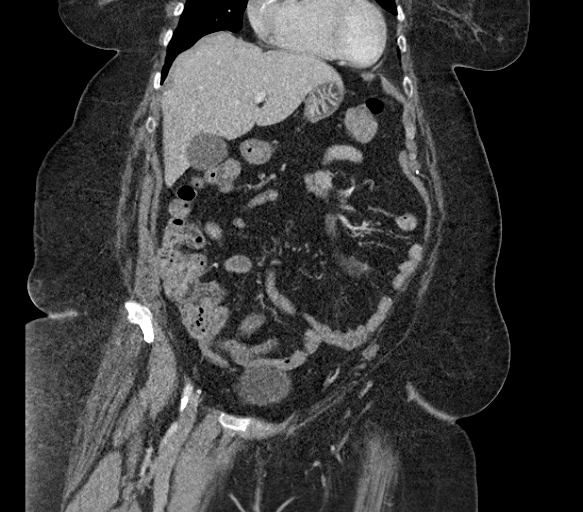
[im 45/101  soft-tissue]
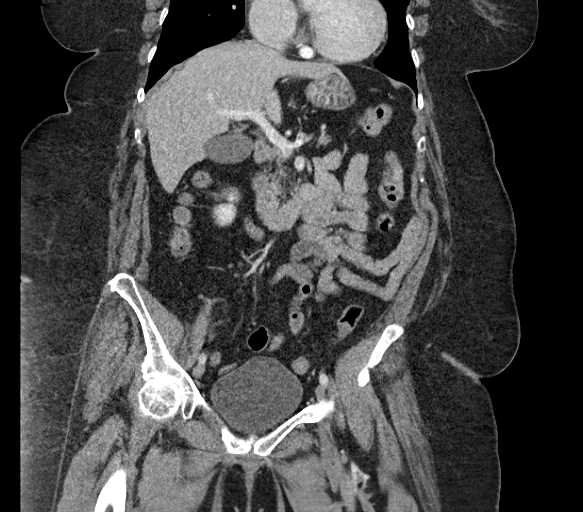
[im 56/101  soft-tissue]
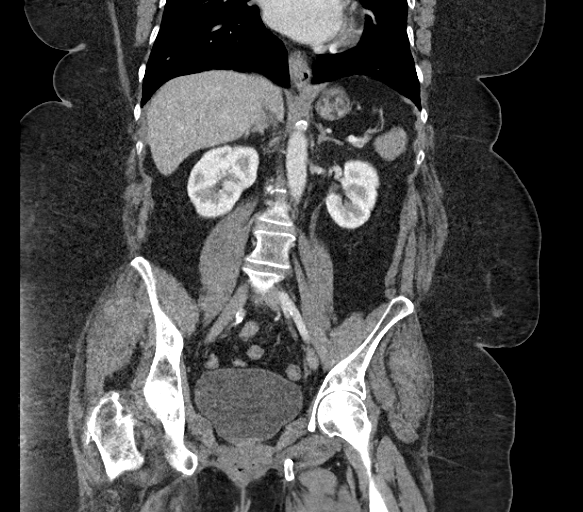

[16 of 46 positions shown; findings below may reference images not displayed]

FINDINGS: Lower chest: Mild dependent changes in the lung bases. Small
subpleural nodules likely representing lymph nodes. Coronary artery
calcifications.

Hepatobiliary: No focal liver abnormality is seen. No gallstones,
gallbladder wall thickening, or biliary dilatation.

Pancreas: Unremarkable. No pancreatic ductal dilatation or
surrounding inflammatory changes.

Spleen: Normal in size without focal abnormality.

Adrenals/Urinary Tract: Adrenal glands are unremarkable. Kidneys are
normal, without renal calculi, focal lesion, or hydronephrosis.
Bladder is unremarkable.

Stomach/Bowel: Stomach, small bowel, and colon are mostly
decompressed. No wall thickening or inflammatory changes identified.
Appendix is not identified.

Vascular/Lymphatic: Aortic atherosclerosis. No enlarged abdominal or
pelvic lymph nodes.

Reproductive: Status post hysterectomy. No adnexal masses.

Other: No abdominal wall hernia or abnormality. No abdominopelvic
ascites.

Musculoskeletal: Degenerative changes in the spine. Degenerative
disc disease at multiple levels. Anterior compression of the L2
vertebra no prior comparison studies are available. There is
cortical irregularity of the superior endplate suggesting that this
may be acute. Degenerative changes in the hips.
IMPRESSION: 1. No acute process demonstrated in the abdomen or pelvis. No
evidence of bowel obstruction or inflammation.
2. Anterior compression of the L2 vertebra. There is cortical
irregularity of the superior endplate suggesting that this may be
acute.

Aortic Atherosclerosis ([id number]).

## 2020-08-02 MED ORDER — FLUOXETINE HCL 10 MG PO CAPS: 10.0000 mg | ORAL_CAPSULE | Freq: Every day | ORAL | 2 refills | Status: DC

## 2020-08-07 DIAGNOSIS — E1065 Type 1 diabetes mellitus with hyperglycemia: Secondary | ICD-10-CM | POA: Diagnosis not present

## 2020-08-09 DIAGNOSIS — G4733 Obstructive sleep apnea (adult) (pediatric): Secondary | ICD-10-CM | POA: Diagnosis not present

## 2020-08-10 ENCOUNTER — Encounter: Payer: Self-pay | Admitting: Family Medicine

## 2020-08-10 ENCOUNTER — Other Ambulatory Visit: Payer: Self-pay

## 2020-08-10 ENCOUNTER — Telehealth (INDEPENDENT_AMBULATORY_CARE_PROVIDER_SITE_OTHER): Payer: Medicare PPO | Admitting: Family Medicine

## 2020-08-10 DIAGNOSIS — G8929 Other chronic pain: Secondary | ICD-10-CM | POA: Diagnosis not present

## 2020-08-10 DIAGNOSIS — W19XXXS Unspecified fall, sequela: Secondary | ICD-10-CM | POA: Diagnosis not present

## 2020-08-10 DIAGNOSIS — M545 Low back pain: Secondary | ICD-10-CM | POA: Diagnosis not present

## 2020-08-10 NOTE — Progress Notes (Signed)
[place] Telemedicine Visit  Patient consented to have virtual visit and was identified by name and date of birth. Method of visit: Telephone  Encounter participants: Patient: [name] - located at home address  Provider: Eulis Foster - located at [facility]  Others (if applicable): none   Chief Complaint: follow up for back pain   HPI:  Contacted patient at listed number.  Patient reports that she was doing well. She recently came into the office and was prescribed gabapentin twice daily 9AM and 9PM for her back pain. She reports that she is "doing ok" and is not having any pain today. Patient states that she has been able to use her walker for mobility around her home. She is requesting to know if Medicare allows patients to go to a place like [facility], similar to adult day center as she is concerned about safety at home for prolonged periods of time and is worried about fall. Patient is agreeable to home visit with Geriatric clinic given concern for falls. Patient is excited to report that she has received approval for SCAT. Congratulated patient on good news.   ROS: per HPI  Pertinent PMHx:  Near syncope -Diabetes, type I -Morbid obesity -Wheelchair mobility -History of falls  Exam:   Respiratory: speaking in clear sentences no signs of respiratory distress, no coughing   Assessment/Plan:  Chronic lower back pain Patient to continue gabapentin twice daily   History of Falls with injury Due to concern for falls and history of falls as well as being wheelchair bound, believe patient would be good candidate for home visit in geriatric clinic. Patient is agreeable with this  - Discussed patient with Dr. Wendy Poet and Katherina Mires - patient scheduled for home visit in Sept    Time spent during visit with patient: 15 minutes

## 2020-08-11 ENCOUNTER — Encounter: Payer: Self-pay | Admitting: *Deleted

## 2020-08-11 ENCOUNTER — Encounter: Payer: Self-pay | Admitting: Family Medicine

## 2020-08-12 NOTE — Assessment & Plan Note (Signed)
Due to concern for falls and history of falls as well as being wheelchair bound, believe patient would be good candidate for home visit in geriatric clinic. Patient is agreeable with this  - Discussed patient with Dr. Wendy Poet and Katherina Mires - patient scheduled for home visit in Sept

## 2020-08-12 NOTE — Assessment & Plan Note (Signed)
Patient to continue gabapentin twice daily

## 2020-08-17 ENCOUNTER — Emergency Department (HOSPITAL_COMMUNITY)
Admission: EM | Admit: 2020-08-17 | Discharge: 2020-08-18 | Disposition: A | Discharge status: Home / Self Care | Payer: Medicare PPO | Attending: Emergency Medicine | Admitting: Emergency Medicine

## 2020-08-17 ENCOUNTER — Other Ambulatory Visit: Payer: Self-pay

## 2020-08-17 ENCOUNTER — Encounter (HOSPITAL_COMMUNITY): Payer: Self-pay | Admitting: Emergency Medicine

## 2020-08-17 ENCOUNTER — Emergency Department (HOSPITAL_COMMUNITY): Payer: Medicare PPO

## 2020-08-17 DIAGNOSIS — Y939 Activity, unspecified: Secondary | ICD-10-CM | POA: Diagnosis not present

## 2020-08-17 DIAGNOSIS — Y999 Unspecified external cause status: Secondary | ICD-10-CM | POA: Insufficient documentation

## 2020-08-17 DIAGNOSIS — M549 Dorsalgia, unspecified: Secondary | ICD-10-CM | POA: Insufficient documentation

## 2020-08-17 DIAGNOSIS — R0781 Pleurodynia: Secondary | ICD-10-CM | POA: Insufficient documentation

## 2020-08-17 DIAGNOSIS — W19XXXA Unspecified fall, initial encounter: Secondary | ICD-10-CM | POA: Insufficient documentation

## 2020-08-17 DIAGNOSIS — E890 Postprocedural hypothyroidism: Secondary | ICD-10-CM | POA: Diagnosis not present

## 2020-08-17 DIAGNOSIS — S2242XA Multiple fractures of ribs, left side, initial encounter for closed fracture: Secondary | ICD-10-CM | POA: Diagnosis not present

## 2020-08-17 DIAGNOSIS — I639 Cerebral infarction, unspecified: Secondary | ICD-10-CM | POA: Diagnosis not present

## 2020-08-17 DIAGNOSIS — Y929 Unspecified place or not applicable: Secondary | ICD-10-CM | POA: Diagnosis not present

## 2020-08-17 DIAGNOSIS — I10 Essential (primary) hypertension: Secondary | ICD-10-CM | POA: Diagnosis not present

## 2020-08-17 DIAGNOSIS — Z794 Long term (current) use of insulin: Secondary | ICD-10-CM | POA: Diagnosis not present

## 2020-08-17 DIAGNOSIS — E7800 Pure hypercholesterolemia, unspecified: Secondary | ICD-10-CM | POA: Diagnosis not present

## 2020-08-17 DIAGNOSIS — Z9641 Presence of insulin pump (external) (internal): Secondary | ICD-10-CM | POA: Diagnosis not present

## 2020-08-17 DIAGNOSIS — E109 Type 1 diabetes mellitus without complications: Secondary | ICD-10-CM | POA: Diagnosis not present

## 2020-08-17 DIAGNOSIS — C73 Malignant neoplasm of thyroid gland: Secondary | ICD-10-CM | POA: Diagnosis not present

## 2020-08-17 DIAGNOSIS — E11319 Type 2 diabetes mellitus with unspecified diabetic retinopathy without macular edema: Secondary | ICD-10-CM | POA: Diagnosis not present

## 2020-08-17 DIAGNOSIS — I517 Cardiomegaly: Secondary | ICD-10-CM | POA: Diagnosis not present

## 2020-08-17 DIAGNOSIS — Z5321 Procedure and treatment not carried out due to patient leaving prior to being seen by health care provider: Secondary | ICD-10-CM | POA: Diagnosis not present

## 2020-08-17 NOTE — ED Triage Notes (Signed)
Patient reports mechanical fall on Sunday. Normally uses motorized wheelchair to get around. Reports L sided rib cage pain and generalized back pain.

## 2020-08-19 ENCOUNTER — Other Ambulatory Visit: Payer: Self-pay | Admitting: Family Medicine

## 2020-08-19 MED ORDER — CEPHALEXIN 500 MG PO CAPS
500.0000 mg | ORAL_CAPSULE | Freq: Four times a day (QID) | ORAL | 0 refills | Status: AC
Start: 2020-08-19 — End: 2020-08-24

## 2020-08-19 NOTE — Progress Notes (Addendum)
**  After-Hours call**  Patient calls after-hours call stating that she is concerned that she has a UTI.  Patient reports that she has not been able to urinate.  Symptoms started yesterday.  Patient reports that she recently went to the ED after sustaining a fall but left before completing evaluation due to wait of 10 hours estimated by ED staff.  Patient states that she fell because she was having increased urinary urgency at home and was trying to get to the restroom when her feet became tangled in the legs of her walker resulted in her falling.  She states that she did not hit her head and did not feel balance prior to the fall.  X-ray from ED (8/24) shows no rib fracture or other cardiopulmonary disease.  Patient reports that she has had multiple UTIs in the past.  She reports that she feels that she needs to use the restroom but when she gets to the toilet, she takes a long time to actually began urinary stream.  Patient denies any fevers, chills.  Patient reports clear vaginal discharge where she normally has no discharge.  Patient also reports constipation intermittently.  She states that she does not use an adult diaper.  She states that she has some dribbling when she is able to urinate.  Patient states that she has chronic abdominal and back pain at baseline and cannot tell if there is anything new associated with her urinary symptoms.  Assessment and plan Patient describes urinary hesitancy but denies any dysuria and with presence of vaginal discharge, patient could have urinary tract infection.  Also some concern for bladder obstruction given increased hesitancy.  Patient denies any numbness that would be concerning for pelvic trauma contributing to difficulty with urinary output.  We will treat as UTI for now given that patient has not been able to provide a sample and has difficulty with transportation issues. Suspect acute cystitis. Patient not sexually active so low suspicion for STI. Could also  consider bacterial vaginosis given report of clear vaginal discharge. Patient reports history of frequent constipation that would be risk factor for UTI.   Eulis Foster, MD  [facility], PGY-2  [place] Intern Pager [phone]

## 2020-08-24 ENCOUNTER — Telehealth: Payer: Medicare PPO | Admitting: Family Medicine

## 2020-08-25 DIAGNOSIS — R296 Repeated falls: Secondary | ICD-10-CM | POA: Diagnosis not present

## 2020-09-02 ENCOUNTER — Ambulatory Visit: Payer: Medicare PPO | Admitting: Family Medicine

## 2020-09-02 ENCOUNTER — Telehealth: Payer: Self-pay | Admitting: *Deleted

## 2020-09-02 ENCOUNTER — Other Ambulatory Visit: Payer: Self-pay

## 2020-09-02 VITALS — BP 140/82 | HR 90 | Temp 97.9°F

## 2020-09-02 DIAGNOSIS — F0631 Mood disorder due to known physiological condition with depressive features: Secondary | ICD-10-CM

## 2020-09-02 DIAGNOSIS — G4733 Obstructive sleep apnea (adult) (pediatric): Secondary | ICD-10-CM | POA: Diagnosis not present

## 2020-09-02 DIAGNOSIS — W19XXXS Unspecified fall, sequela: Secondary | ICD-10-CM

## 2020-09-02 DIAGNOSIS — M25612 Stiffness of left shoulder, not elsewhere classified: Secondary | ICD-10-CM

## 2020-09-02 DIAGNOSIS — Z7189 Other specified counseling: Secondary | ICD-10-CM | POA: Diagnosis not present

## 2020-09-02 DIAGNOSIS — H524 Presbyopia: Secondary | ICD-10-CM | POA: Diagnosis not present

## 2020-09-02 DIAGNOSIS — G3184 Mild cognitive impairment, so stated: Secondary | ICD-10-CM | POA: Diagnosis not present

## 2020-09-02 DIAGNOSIS — R2689 Other abnormalities of gait and mobility: Secondary | ICD-10-CM

## 2020-09-02 DIAGNOSIS — K08109 Complete loss of teeth, unspecified cause, unspecified class: Secondary | ICD-10-CM | POA: Diagnosis not present

## 2020-09-02 DIAGNOSIS — K5900 Constipation, unspecified: Secondary | ICD-10-CM | POA: Diagnosis not present

## 2020-09-02 DIAGNOSIS — I69398 Other sequelae of cerebral infarction: Secondary | ICD-10-CM

## 2020-09-02 DIAGNOSIS — Z789 Other specified health status: Secondary | ICD-10-CM

## 2020-09-02 DIAGNOSIS — Z993 Dependence on wheelchair: Secondary | ICD-10-CM

## 2020-09-02 NOTE — Chronic Care Management (AMB) (Signed)
  Chronic Care Management   Note  09/02/2020 Name: [name] MRN: [id number] DOB: [birth year]  [name] is a 69 y.o. year old female who is a primary care patient of [name], [name], MD. I reached out to [name] by phone today in response to a referral sent by [name]'s PCP, Eulis Foster, MD.     [name] was given information about Chronic Care Management services today including:  1. CCM service includes personalized support from designated clinical staff supervised by her physician, including individualized plan of care and coordination with other care providers 2. 24/7 contact phone numbers for assistance for urgent and routine care needs. 3. Service will only be billed when office clinical staff spend 20 minutes or more in a month to coordinate care. 4. Only one practitioner may furnish and bill the service in a calendar month. 5. The patient may stop CCM services at any time (effective at the end of the month) by phone call to the office staff. 6. The patient will be responsible for cost sharing (co-pay) of up to 20% of the service fee (after annual deductible is met).  Patient agreed to services and verbal consent obtained.   Follow up plan: Telephone appointment with care management team member scheduled for: 09/09/2020  [place] [facility]

## 2020-09-03 ENCOUNTER — Encounter: Payer: Self-pay | Admitting: Family Medicine

## 2020-09-03 DIAGNOSIS — K5900 Constipation, unspecified: Secondary | ICD-10-CM | POA: Insufficient documentation

## 2020-09-03 DIAGNOSIS — R2689 Other abnormalities of gait and mobility: Secondary | ICD-10-CM | POA: Insufficient documentation

## 2020-09-03 DIAGNOSIS — K08109 Complete loss of teeth, unspecified cause, unspecified class: Secondary | ICD-10-CM

## 2020-09-03 DIAGNOSIS — Z7189 Other specified counseling: Secondary | ICD-10-CM | POA: Insufficient documentation

## 2020-09-03 DIAGNOSIS — H524 Presbyopia: Secondary | ICD-10-CM | POA: Insufficient documentation

## 2020-09-03 DIAGNOSIS — M25619 Stiffness of unspecified shoulder, not elsewhere classified: Secondary | ICD-10-CM

## 2020-09-03 DIAGNOSIS — G3184 Mild cognitive impairment, so stated: Secondary | ICD-10-CM | POA: Insufficient documentation

## 2020-09-03 HISTORY — DX: Stiffness of unspecified shoulder, not elsewhere classified: M25.619

## 2020-09-03 HISTORY — DX: Complete loss of teeth, unspecified cause, unspecified class: K08.109

## 2020-09-03 NOTE — Assessment & Plan Note (Signed)
Patient notices that her short term memory has been worse lately.  She is still able to manage her own finances and is not limited in ADLs due to her memory.  She scored a 24/30 on MoCA performed today.  Discussed the benefits of socialization and interaction as well as healthy diet on delaying the progression of cognitive decline.

## 2020-09-03 NOTE — Addendum Note (Signed)
Addended byLissa Morales D on: 09/03/2020 12:35 PM   Modules accepted: Orders, Level of Service

## 2020-09-03 NOTE — Assessment & Plan Note (Signed)
Patient needs help with certain ADLs due to her immobility.  This includes general housekeeping, medication management, and preparing meals. She does not have any current lmiitations of ADLs due to her cognition.   - referral for home health PT/OT/nurse/nurse aid

## 2020-09-03 NOTE — Assessment & Plan Note (Signed)
Pt states her vision has worsened and feels like she needs her vision reassessed.  Will work with social work to arrange opthalmology appointment

## 2020-09-03 NOTE — Assessment & Plan Note (Addendum)
Pt currently using CPAP at night.  Is overdue for visit with somnologist. Last appt documented was 10/282020 and stated they will prescribe a BiPAP at night w/ 10 week followup. Will work with social work to schedule appointment for adjustment of cpap device.

## 2020-09-03 NOTE — Assessment & Plan Note (Signed)
Patient feels depressed due to her current state of health and dependence on others. She feels her life is empty sometimes, but still gets enjoyment out of talking with friends and playing her computer games.  She endorses passive suicidal thoughts, but no active plan and denies any desire to commit suicide. She currently takes 30mg  prozac and is compliant with medication.  She would be willing to talk with a therapist over the computer.   - work with social work to get pt list of therapy resources that will do telemedicine sessions.   - continue current dose of prozac.   - would benefit from socialization in an adult day program such as [facility].

## 2020-09-03 NOTE — Progress Notes (Signed)
SUBJECTIVE:   CHIEF COMPLAINT / HPI:   We went to Ms. [place] for a home visit in regards to her recent falls and to assess her general state of well being.  Miss Cassada lives at home with her sister and adoptive granddaughter, Terrence Dupont. She is a retired Programmer, [employer].    CVA, Hx of falls: The patient states she most recently fell while trying to ambulate to the bathroom and becoming entangled in the legs of her walker.  The patient has a history of CVA w/ residual left sided weakness in 2017. She also has a history of broken left arm which was unable to undergo surgery.  As a results she now has decreased ROM and strength in the left side.  She mostly uses a motorized wheelchair but can transition to ambulating with a rolling walker when needed.  Her car is equipped to transport her wheelchair but she states she rarely goes out anywhere. She used to have home health PT but stopped this due to COVID over a year ago, and was never called by anyone to resume the treatments.     Depression: Ms. Paulsen states she sometimes feels unsatisfied and hopeless because of her current situation.  She does not like to be a burden to others and is adjusting to being unable to do the things she used to do before her stroke.  She states her sister also has difficulty with this adjustment.  She feels lonely. she still talks on the phone with her friend from [place]. and attends church through [employer], but is unable to socialize in person due to [place] and her dependence on others for transportation. She spends a lot of her time on the computer playing computer games. The patient states she occasionally has thoughts of wishing to be dead, but has no active suicidal thoughts.  When asked how she thinks she would commit suicide if she did go through with it she stated 'take a bunch of pills'.  She quickly states she would never kill herself though, because it is against her religion and she wants to stay  alive for Miracle, her granddaughter, so she can watch her graduate.    ADLs: The patient's dependence on others revolves mainly on her left sided weakness and balance issues.  She can operate a phone on her own, as well as handling her own finances. She is able to use the bathroom on her own and bathe in the sink as long as someone helps her fill the bowl with water.  She feeds herself but does not cook bc of her mobility issues.  She will order takeout or microwave ready to eat meals. She can manage her own medications as long as someone gets the pill bottles for her, which are out of her reach. Her granddaughter helps her with sorting her medications. She occasionally forgets to take her medications. She hires [employer] to do her housecleaning.    Memory: the patient feels like her memory is getting worse.  She feels like it worsened after her stroke.  She has trouble with her short term memory such as remembering conversations from the day before or what she had to eat or drink.  She denies any hallucinations. She takes naps during the day, usually 1-2 hours, but yesterday she slept from 12-5pm before being awoken by a phone call.  She states she notices a tremor in her hands when shes resting and when she is reaching  for something.        Urinary incontinence: the patient recently called the family medicine clinic after hours line after her fall when trying to ambulate to the restroom.  She had complained of the need to urinate more frequently and more urgently and this is what led to her fall.  She was prescribed antibiotics for possible UTI and says she has not had any urinary issues since that time.    The patient states she feels like her vision has slightly worsened recently.  She has no issues with hearing.  She wears cpap at night for OSA. She does not have a living will or an official HCPOA but states she has talked with her son about her wishes if she were to ever not be able to make  decisions for herself.    PERTINENT  PMH / PSH: CVA, HTN, DM1, hypothyroidism, falls on chronic AC, pAF,   OBJECTIVE:   BP 140/82   Pulse 90   Temp 97.9 F (36.6 C) (Oral)   SpO2 99%   Gen: alert, oriented x 4.  Elderly female, appears older than stated age. Sitting in motorized wheelchair.  House and front yard is generally well maintained.  MSK: limited ROM in the LUE, especially elevating arm overhead.  Normal ROM in LUE.patient able to transition from wheelchair to walker. With assistance pt was able to stand in place.  She was unable to take a step forward on her own.   Psych: pt generally amiable but became tearful after being asked questions regarding depression assessment.  Reiterated multiple times during encounter that she would never commit suicide bc of her religious beliefs and her family.  Pt scored 24/30 on MoCA.   ASSESSMENT/PLAN:   History of Falls with injury Pt with left sided weakness 2/2 to CVA and fall resulting in humoral fracture that healed nonoperatively.  Currently ambulates in motorized wheelchair with occasional use of rolling walker.  Had a fall as recently as last month.  On chronic anticoagulation. Home health PT interrupted by pandemic and has not resumed.   - resume Home health PT.   - consider discontinuing anticoagulation if falls continue to occur.     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 She feels her life is empty sometimes, but still gets enjoyment out of talking with friends and playing her computer games.  She endorses passive suicidal thoughts, but no active plan and denies any desire to commit suicide. She currently takes 30mg  prozac and is compliant with medication.  She would be willing to talk with a therapist over the computer.   - work with social work to get pt list of therapy resources that will do telemedicine sessions.   - continue current dose of prozac.   -  would benefit from socialization in an adult day program such as [facility].    Dependent for wheelchair mobility Patient needs help with certain ADLs due to her immobility.  This includes general housekeeping, medication management, and preparing meals. She does not have any current lmiitations of ADLs due to her cognition.   - referral for home health PT/OT/nurse/nurse aid  Mild cognitive impairment Patient notices that her short term memory has been worse lately.  She is still able to manage her own finances and is not limited in ADLs due to her memory.  She scored a 24/30 on MoCA performed today.  Discussed the benefits of socialization  and interaction as well as healthy diet on delaying the progression of cognitive decline.    OSA (obstructive sleep apnea) Pt currently using CPAP at night.  Is overdue for visit with somnologist. Last appt documented was 10/282020 and stated they will prescribe a BiPAP at night w/ 10 week followup. Will work with social work to schedule appointment for adjustment of cpap device.   Presbyopia Pt states her vision has worsened and feels like she needs her vision reassessed.  Will work with social work to arrange opthalmology appointment   Counseling regarding advanced directives Pt does not have advanced directives or official HCPOA.  She states she has had this discussion regarding advanced directives with her brother.  We discussed with the pt the process of assigning designated decision makers in the event of her inability to make decisions.  Discussed the benefits of having a notorized HCPOA and living will.  Will work with social work and pcp to continue discussions about advanced directives with pt at future appointments.       Benay Pike, MD [place]

## 2020-09-03 NOTE — Assessment & Plan Note (Signed)
Pt does not have advanced directives or official HCPOA.  She states she has had this discussion regarding advanced directives with her brother.  We discussed with the pt the process of assigning designated decision makers in the event of her inability to make decisions.  Discussed the benefits of having a notorized HCPOA and living will.  Will work with social work and pcp to continue discussions about advanced directives with pt at future appointments.

## 2020-09-03 NOTE — Assessment & Plan Note (Addendum)
Falls with head trauma last month during transfer activity Fall with left humeral compound fracture  Fearful of falling.  Stable with wide stance after 1-assist up from chair. Unable to balance with narrow stance.  Patient is unable to go out of home because it would be a taxing and considerable effort.  Referral for home Physical Therapy to assist with balance and transfer training.

## 2020-09-03 NOTE — Assessment & Plan Note (Signed)
Pt with left sided weakness 2/2 to CVA and fall resulting in humoral fracture that healed nonoperatively.  Currently ambulates in motorized wheelchair with occasional use of rolling walker.  Had a fall as recently as last month.  On chronic anticoagulation. Home health PT interrupted by pandemic and has not resumed.   - resume Home health PT.   - consider discontinuing anticoagulation if falls continue to occur.

## 2020-09-03 NOTE — Progress Notes (Addendum)
[name] is alone Sources of clinical information for visit is/are patient and past medical records. Nursing assessment for this office visit was reviewed with the patient for accuracy and revision.   Previous Report(s) Reviewed: historical medical records, lab reports, office notes and radiology reports    Fall Risk  09/02/2020 08/10/2020 07/28/2020 06/02/2020 05/05/2020  Falls in the past year? 1 1 1 1 1   Number falls in past yr: 1 0 1 1 1   Injury with Fall? 1 1 0 1 1  Comment - - - - -  Risk Factor Category  - - - - -  Risk for fall due to : Impaired balance/gait;History of fall(s);Impaired mobility - Impaired balance/gait;History of fall(s);Impaired mobility History of fall(s);Impaired balance/gait;Impaired mobility -  Follow up - - - - -  Comment - - - - -    PHQ9 SCORE ONLY 08/10/2020 07/28/2020 06/02/2020  PHQ-9 Total Score 0 1 0    Adult vaccines due  Topic Date Due  . TETANUS/TDAP  01/24/2022    Health Maintenance Due  Topic Date Due  . DEXA SCAN  12/29/2015  . OPHTHALMOLOGY EXAM  12/28/2017  . MAMMOGRAM  10/05/2019  . FOOT EXAM  12/10/2019  . COVID-19 Vaccine (2 - Pfizer 2-dose series) 07/19/2020  . INFLUENZA VACCINE  07/25/2020      History/P.E. limitations: none  Adult vaccines due  Topic Date Due  . TETANUS/TDAP  01/24/2022    Diabetes Health Maintenance Due  Topic Date Due  . OPHTHALMOLOGY EXAM  12/28/2017  . FOOT EXAM  12/10/2019  . HEMOGLOBIN A1C  12/26/2020    Health Maintenance Due  Topic Date Due  . DEXA SCAN  12/29/2015  . OPHTHALMOLOGY EXAM  12/28/2017  . MAMMOGRAM  10/05/2019  . FOOT EXAM  12/10/2019  . COVID-19 Vaccine (2 - Pfizer 2-dose series) 07/19/2020  . INFLUENZA VACCINE  07/25/2020     Chief Complaint  Patient presents with  . Fall  . Depression    Montreal Cognitive Assessment  09/03/2020  Visuospatial/ Executive (0/5) 4  Naming (0/3) 3  Attention: Read list of digits (0/2) 2  Attention: Read list of letters (0/1)  1  Attention: Serial 7 subtraction starting at 100 (0/3) 3  Language: Repeat phrase (0/2) 1  Language : Fluency (0/1) 1  Abstraction (0/2) 2  Delayed Recall (0/5) 1  Orientation (0/6) 6  Total 24  Adjusted Score (based on education) 24   Geriatric Depression Scale: 11/15  Medication List was reconciled with medication bottles at patient's home today.  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 She feels her life is empty sometimes, but still gets enjoyment out of talking with friends and playing her computer games.  She endorses passive suicidal thoughts, but no active plan and denies any desire to commit suicide. She currently takes 30mg  prozac and is compliant with medication.  She would be willing to talk with a therapist over the computer.   - work with social work to get pt list of therapy resources that will do telemedicine sessions.   - continue current dose of prozac.   - [name] would benefit from social activities.  She says she would like to be more social.           Medium   OSA (obstructive sleep apnea) (Chronic)  Pt currently using CPAP at night.  Is overdue for visit with somnologist. Last appt documented was 10/282020 and stated they will prescribe a BiPAP at night w/ 10 week followup. Will work with social work to schedule appointment for adjustment of cpap device.       Relevant Orders   Ambulatory referral to Neurology     Low   Dependent for wheelchair mobility    Patient needs help with certain ADLs due to her immobility.  This includes general housekeeping, medication management, and preparing meals. She does not have any current lmiitations of ADLs due to her cognition.   - referral for home health PT/OT/nurse/nurse aid      Relevant Orders   Ambulatory referral to [place]    Pt states her vision  has worsened and feels like she needs her vision reassessed.  Will work with social work to arrange opthalmology appointment.  Will ask CCM to help patient arrange appointment with her optometrist at [place] office for diabetic eye examination.  [name] does have access to [facility] transportation.       Mild cognitive impairment - Primary    Patient notices that her short term memory has been worse lately.  She is still able to manage her own finances and is not limited in ADLs due to her memory.  She scored a 24/30 on MoCA performed today.  Discussed the benefits of socialization and interaction as well as healthy diet on delaying the progression of cognitive decline.        Relevant Orders   Ambulatory referral to [place] with head trauma last month during transfer activity Fall with left humeral compound fracture  Fearful of falling.  Stable with wide stance after 1-assist up from chair. Unable to balance with narrow stance.  Patient is unable to go out of home because it would be a taxing and considerable effort.  Referral for home Physical Therapy to assist with balance and transfer training.       History of Falls with injury    Patient with impaired balance and fear of falling.  Referral for [place] the home for OP PT would be a considerable and taxing effort.           Relevant Orders   Ambulatory referral to [place]: [facility]   Decreased range of motion of left shoulder - Decreased range of motion in left shoulder in abduction and flexion.  Left arm easily fatigues.  Referral for Home Occupational Therapy OP OT would be a considerable and taxing effort for pt   Counseling regarding advanced directives    Pt does not have advanced directives or official HCPOA.  She states she has had this discussion regarding advanced directives with her brother.  We discussed with the pt the process of assigning designated  decision makers in the event of her inability to make decisions.  Discussed the benefits of having a notorized HCPOA and living will.   [name] goal is to live long enough to see her granddgt graduate [employer].  Asking CCM to help patient complete her documentation designation her [facility] POA agent and get the document notarized.        Constipation    Other Visit Diagnoses    Difficulty ventilating with mask       Relevant Orders   Ambulatory referral to Neurology -  retrun to Dr A. Athar (Neuro-Sleep @ [facility]) for cc: mask difficulties     Time of home visit was at least 40 minutes.

## 2020-09-06 ENCOUNTER — Encounter: Payer: Self-pay | Admitting: Family Medicine

## 2020-09-07 ENCOUNTER — Other Ambulatory Visit (HOSPITAL_COMMUNITY): Payer: Self-pay | Admitting: Physician Assistant

## 2020-09-07 NOTE — Telephone Encounter (Signed)
Pt of Dr Curt Bears.  Last seen 06/25/20.  Age 69  Wt 105.7kg  Labs on 06/25/20  SCr 0.75  Hgb 10.7  Hct 35.2 Eliquis dosage of 5mg  twice daily is appropriate.  Refill sent in.

## 2020-09-09 ENCOUNTER — Ambulatory Visit: Payer: Medicare PPO | Admitting: Licensed Clinical Social Worker

## 2020-09-09 ENCOUNTER — Encounter: Payer: Self-pay | Admitting: *Deleted

## 2020-09-09 DIAGNOSIS — G4733 Obstructive sleep apnea (adult) (pediatric): Secondary | ICD-10-CM | POA: Diagnosis not present

## 2020-09-09 DIAGNOSIS — R4589 Other symptoms and signs involving emotional state: Secondary | ICD-10-CM

## 2020-09-09 DIAGNOSIS — F439 Reaction to severe stress, unspecified: Secondary | ICD-10-CM

## 2020-09-09 DIAGNOSIS — Z7189 Other specified counseling: Secondary | ICD-10-CM

## 2020-09-09 NOTE — Chronic Care Management (AMB) (Signed)
Care Management   Clinical Social Work initial Note  09/09/2020 Name: [name] MRN: [id number] DOB: 01-May-1951 [name] is a 69 y.o. year old female who sees [name], [name] Sheer, MD for primary care. The Care Management team was consulted by provider to assist the patient with Mental Health Counseling and Resources. Marland Kitchen  LCSW reached out to [facility] today by phone to introduce self, assess needs and barriers to care.    Assessment: Patient is pleasant and engaged in conversation.  Experiencing  systems of stress and would like help with managing stressors  Patient often feels down due to limitation with her wheelchair.         Recommendation: Patient may benefit from, and is in agreement to referral for ongoing counseling.  Plan:  1. LCSW will F/U with patient in 5 to 7 days 2. Patient will review advance directive information and ready to discuss with LCSW during next encounter   Review of patient status, including review of consultants reports, relevant laboratory and other test results, and collaboration with appropriate care team members and the patient's provider was performed as part of comprehensive patient evaluation and provision of chronic care management services.    Advance Directive Status: N See Care Planfor related entries.   SDOH (Social Determinants of Health) assessments performed: Yes:  SDOH Interventions     Most Recent Value  SDOH Interventions  SDOH Interventions for the Following Domains Depression  Depression Interventions/Treatment  Counseling  [referral for ongoing counseling [id number]]      ; Goals Addressed            This Visit's Progress   . Advance Directives       CARE PLAN ENTRY (see longitudinal plan of care for additional care plan information)  Current Barriers:  . Patient does not have an Forensic scientist . Patient acknowledges deficits, education and support in order to complete this  document Clinical Social Work Goal(s): Over the next 30 to 60 days,  . the patient will review and complete Advance Directive packet, have notarized and provide a copy to provider office . review mailed EMMI education on Advance Directive as evidenced by patient self report of review Interventions provided by LCSW: . Assessed understanding of Advance Directives . A voluntary discussion about advanced care planning including importance of advanced directives, healthcare proxy and living will was discussed with the patient.  [name] the patient EMMI educational information on Advance Directives Patient Self Care Activities:  . Is able to complete documentation independently . Able to identify [name] . patient will review information e-mailed by LCSW Initial goal documentation    . COMPLETED: Barriers with Transportation       CARE PLAN ENTRY (see longitudinal plan of care for additional care plan information)  Current Barriers & Progress:  . Patient that is wheelchair dependent needs transportation to medical appointments,  . Acknowledges deficits and needs support, education and care coordination in order to meet this unmet need  . PCP completed her part on [employer] application, application faxed by [name] front office staff . Barrier with pre auth from insurance provider for none emergency transportation ( RN clinic not successful with obtaining information to assist with auth after call intake number provider. Marland Kitchen [employer] application and the missing last page was re-faxed to to [employer] attention [name] ( two times) Clinical Goal(s):  Over the next 20 days, patient will receive transportation  and be able to safely  travel without fear of missing appointments Interventions: . Assessed patient needs, what she has used in the past and how currently meeting needs . Provided patient with update on barriers with transportation with her insurance  provider . Collaborated with, [name] RN clinic regarding patient needs . E-mail sent to [employer] to confirm that they have all needed information to process patient's application . Other interventions: emotional support  Patient Self Care Activities: . Patient is unable to independently navigate obtaining auth for transportation without care coordination support  . Patient's family will transport her to next two appointments until other options are resolved. Please see past updates related to this goal by clicking on the "Past Updates" button in the selected goal       . counseling       CARE PLAN ENTRY (see longitudinal plan of care for additional care plan information)  Current Barriers:  . Patient with chronic medical diagnosis acknowledges deficits with connecting to mental health provider for ongoing counseling.  . Patient is experiencing symptoms of depression, felling down, which seem to be exacerbated by her chronic condition.     . Patient needs Support, Education, and Care Coordination in order to meet unmet mental health needs  . Patient unable to pay co-pay for counseling Clinical Social Work Goal(s):  Marland Kitchen Over the next 30 days, patient will work with LCSW to reduce or manage symptoms of stress until connected for ongoing counseling.  Interventions:  . Assessed patient's  previous treatment, needs and barriers to care, SI, protective factors and coping skills. . Discussed several options for long term counseling based on need and insurance. Assisted patient with narrowing the options down to ([name]  ) . Reviewed mental health medications with patient prescribed by PCP and discussed compliance ( patient reports no missed dose . Other interventions include: Motivational Interviewing ;Emotional/Supportive Counseling; Patient Self Care Activities & Deficits:  . Patient is unable to independently navigate community resource options without care coordination  support . Patient is motivated for treatment Initial goal documentation       Outpatient Encounter Medications as of 09/09/2020  Medication Sig  . ACCU-CHEK GUIDE test strip  (Patient not taking: Reported on 09/02/2020)  . acetaminophen (TYLENOL) 500 MG tablet Take 1,000 mg by mouth every 8 (eight) hours as needed for mild pain.   Marland Kitchen amLODipine (NORVASC) 5 MG tablet Take 1 tablet (5 mg total) by mouth daily.  Marland Kitchen atorvastatin (LIPITOR) 80 MG tablet Take 1 tablet (80 mg total) by mouth every evening.  . Continuous Blood Gluc Transmit (DEXCOM G6 TRANSMITTER) MISC by Does not apply route. Checks her blood sugar  . CRANBERRY EXTRACT PO Take 1 capsule by mouth daily.  Marland Kitchen ELIQUIS 5 MG TABS tablet TAKE 1 TABLET BY MOUTH TWICE DAILY  . fexofenadine (ALLEGRA) 180 MG tablet Take 180 mg by mouth daily as needed for allergies or rhinitis.  Marland Kitchen FLUoxetine (PROZAC) 10 MG capsule Take 1 capsule (10 mg total) by mouth daily. Take with the 20 mg dose to equal 30 mg daily  . FLUoxetine (PROZAC) 20 MG tablet Take 20 mg by mouth daily. Take with the 10 mg dose to equal 30 mg daily  . gabapentin (NEURONTIN) 300 MG capsule Take 1 capsule (300 mg total) by mouth 3 (three) times daily.  . insulin aspart (NOVOLOG) 100 UNIT/ML injection Inject 20-30 Units into the skin daily. To be used in patient's insulin pump  . Insulin Human (  INSULIN PUMP) SOLN Inject 1 each into the skin continuous. Novolog insulin  . levothyroxine (SYNTHROID, LEVOTHROID) 175 MCG tablet Take 175 mcg by mouth daily before breakfast.   . loperamide (IMODIUM A-D) 2 MG tablet Take 1 tablet (2 mg total) by mouth 4 (four) times daily as needed for diarrhea or loose stools. (Patient not taking: Reported on 09/02/2020)  . losartan (COZAAR) 100 MG tablet Take 1 tablet (100 mg total) by mouth daily.  . Multiple Vitamins-Minerals (OCUVITE PO) Take 1 tablet by mouth daily.  . ondansetron (ZOFRAN-ODT) 4 MG disintegrating tablet Take 4 mg by mouth every 8 (eight) hours  as needed for nausea or vomiting.  . pantoprazole (PROTONIX) 40 MG tablet Take 1 tablet (40 mg total) by mouth daily. (Patient taking differently: Take 40 mg by mouth 2 (two) times daily. )  . potassium chloride (KLOR-CON) 10 MEQ tablet Take 1 tablet (10 mEq total) by mouth daily.  Marland Kitchen torsemide (DEMADEX) 10 MG tablet TAKE 1 TABLET(10 MG) BY MOUTH DAILY AS NEEDED  . [DISCONTINUED] losartan (COZAAR) 25 MG tablet Take 1 tablet (25 mg total) by mouth at bedtime.  . [DISCONTINUED] ondansetron (ZOFRAN-ODT) 4 MG disintegrating tablet DISSOLVE ONE TABLET BY MOUTH EVERY 8 HOURS AS NEEDED FOR NAUSEA AND VOMITING  . [DISCONTINUED] pantoprazole (PROTONIX) 40 MG tablet Take 1 tablet (40 mg total) by mouth daily.   No facility-administered encounter medications on file as of 09/09/2020.       Casimer Lanius, [place] / [place]   [phone] 3:07 PM

## 2020-09-10 ENCOUNTER — Encounter: Payer: Self-pay | Admitting: Podiatry

## 2020-09-10 ENCOUNTER — Other Ambulatory Visit: Payer: Self-pay

## 2020-09-10 ENCOUNTER — Ambulatory Visit: Payer: Medicare PPO | Admitting: Podiatry

## 2020-09-10 DIAGNOSIS — B351 Tinea unguium: Secondary | ICD-10-CM | POA: Diagnosis not present

## 2020-09-10 DIAGNOSIS — M79674 Pain in right toe(s): Secondary | ICD-10-CM

## 2020-09-10 DIAGNOSIS — E1159 Type 2 diabetes mellitus with other circulatory complications: Secondary | ICD-10-CM | POA: Diagnosis not present

## 2020-09-10 DIAGNOSIS — M79675 Pain in left toe(s): Secondary | ICD-10-CM

## 2020-09-10 DIAGNOSIS — D689 Coagulation defect, unspecified: Secondary | ICD-10-CM | POA: Diagnosis not present

## 2020-09-10 NOTE — Progress Notes (Signed)
This patient returns to my office for at risk foot care.  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  This patient is unable to cut nails herself  since the patient cannot reach her  nails.These nails are painful walking and wearing shoes.  Patient presents in a wheelchair .  This patient presents for at risk foot care today.  General Appearance  Alert, conversant and in no acute stress.  Vascular  Dorsalis pedis and posterior tibial  pulses are weakly  palpable  bilaterally.  Capillary return is within normal limits  bilaterally. Temperature is within normal limits  bilaterally.  Neurologic  Senn-Weinstein monofilament wire diminished  bilaterally. Muscle power within normal limits bilaterally.  Nails Thick disfigured discolored nails with subungual debris  from hallux to fifth toes bilaterally. No evidence of bacterial infection or drainage bilaterally.  Orthopedic  No limitations of motion  feet .  No crepitus or effusions noted.  No bony pathology or digital deformities noted.  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  Skin  normotropic skin with no porokeratosis noted bilaterally.  No signs of infections or ulcers noted.     Onychomycosis  Pain in right toes  Pain in left toes  Consent was obtained for treatment procedures.   Mechanical debridement of nails 1-5  bilaterally performed with a nail nipper.  Filed with dremel without incident. No infection or ulcer.     Return office visit    3 months       Told patient to return for periodic foot care and evaluation due to potential at risk complications.   Gardiner Barefoot DPM

## 2020-09-11 ENCOUNTER — Encounter: Payer: Self-pay | Admitting: Family Medicine

## 2020-09-11 ENCOUNTER — Other Ambulatory Visit: Payer: Self-pay | Admitting: Family Medicine

## 2020-09-13 ENCOUNTER — Other Ambulatory Visit: Payer: Self-pay | Admitting: *Deleted

## 2020-09-13 DIAGNOSIS — I69398 Other sequelae of cerebral infarction: Secondary | ICD-10-CM | POA: Diagnosis not present

## 2020-09-13 DIAGNOSIS — I69354 Hemiplegia and hemiparesis following cerebral infarction affecting left non-dominant side: Secondary | ICD-10-CM | POA: Diagnosis not present

## 2020-09-13 DIAGNOSIS — I4891 Unspecified atrial fibrillation: Secondary | ICD-10-CM | POA: Diagnosis not present

## 2020-09-13 DIAGNOSIS — G3184 Mild cognitive impairment, so stated: Secondary | ICD-10-CM | POA: Diagnosis not present

## 2020-09-13 DIAGNOSIS — M160 Bilateral primary osteoarthritis of hip: Secondary | ICD-10-CM | POA: Diagnosis not present

## 2020-09-13 DIAGNOSIS — I10 Essential (primary) hypertension: Secondary | ICD-10-CM | POA: Diagnosis not present

## 2020-09-13 DIAGNOSIS — E119 Type 2 diabetes mellitus without complications: Secondary | ICD-10-CM | POA: Diagnosis not present

## 2020-09-13 DIAGNOSIS — M25612 Stiffness of left shoulder, not elsewhere classified: Secondary | ICD-10-CM | POA: Diagnosis not present

## 2020-09-13 DIAGNOSIS — F0631 Mood disorder due to known physiological condition with depressive features: Secondary | ICD-10-CM | POA: Diagnosis not present

## 2020-09-13 MED ORDER — FLUOXETINE HCL 10 MG PO CAPS: 10.0000 mg | ORAL_CAPSULE | Freq: Every day | ORAL | 2 refills | Status: DC

## 2020-09-13 MED ORDER — AMLODIPINE BESYLATE 5 MG PO TABS: 5.0000 mg | ORAL_TABLET | Freq: Every day | ORAL | 1 refills | Status: DC

## 2020-09-14 ENCOUNTER — Ambulatory Visit: Payer: Medicare PPO | Admitting: Licensed Clinical Social Worker

## 2020-09-14 DIAGNOSIS — G3184 Mild cognitive impairment, so stated: Secondary | ICD-10-CM | POA: Diagnosis not present

## 2020-09-14 DIAGNOSIS — I69398 Other sequelae of cerebral infarction: Secondary | ICD-10-CM | POA: Diagnosis not present

## 2020-09-14 DIAGNOSIS — I10 Essential (primary) hypertension: Secondary | ICD-10-CM | POA: Diagnosis not present

## 2020-09-14 DIAGNOSIS — I4891 Unspecified atrial fibrillation: Secondary | ICD-10-CM | POA: Diagnosis not present

## 2020-09-14 DIAGNOSIS — M25612 Stiffness of left shoulder, not elsewhere classified: Secondary | ICD-10-CM | POA: Diagnosis not present

## 2020-09-14 DIAGNOSIS — M160 Bilateral primary osteoarthritis of hip: Secondary | ICD-10-CM | POA: Diagnosis not present

## 2020-09-14 DIAGNOSIS — F0631 Mood disorder due to known physiological condition with depressive features: Secondary | ICD-10-CM | POA: Diagnosis not present

## 2020-09-14 DIAGNOSIS — E119 Type 2 diabetes mellitus without complications: Secondary | ICD-10-CM | POA: Diagnosis not present

## 2020-09-14 DIAGNOSIS — Z7189 Other specified counseling: Secondary | ICD-10-CM

## 2020-09-14 DIAGNOSIS — I69354 Hemiplegia and hemiparesis following cerebral infarction affecting left non-dominant side: Secondary | ICD-10-CM | POA: Diagnosis not present

## 2020-09-14 NOTE — Chronic Care Management (AMB) (Signed)
Care Management   Clinical Social Work Follow Up   09/14/2020 Name: [name] MRN: [id number] DOB: 20-Apr-1951 Referred by: Eulis Foster, MD  Reason for referral : Care Coordination (F/U)  [name] [name] is a 69 y.o. year old female who is a primary care patient of [name], [name], MD.  Reason for follow-up: assess for barriers and progress with care plan .   Assessment: Patient continues to experience symptoms of depression which seems to be exacerbated by chronic diagnosis. She continues to experience barriers with obtaining affordable counseling. [facility] will reach out to patient again to see if she is eligible.   Patient also reports HH started. RN came to her home today and she is expecting PT in the next week.  Patient would like continued follow-up from CCM LCSW. Plan: LCSW will F/U in 1 week  Advance Directive Status: N See Care for related entries.  SDOH (Social Determinants of Health) assessments performed:; No needs identified   Goals Addressed            This Visit's Progress   . Advance Directives       CARE PLAN ENTRY (see longitudinal plan of care for additional care plan information)  Current Barriers:  . Patient does not have an Forensic scientist . Patient acknowledges deficits, education and support in order to complete this document Clinical Social Work Goal(s): Over the next 30 to 60 days,  . the patient will review and complete Advance Directive packet, have notarized and provide a copy to provider office . review mailed EMMI education on Advance Directive as evidenced by patient self report of review Interventions provided by LCSW: . Assessed understanding of Advance Directives . A voluntary discussion about advanced care planning including importance of advanced directives, healthcare proxy and living will was discussed with the patient.  . Reviewed Advance Directives with patient and assisted with  completing paperwork.  Mailed copy to patient Patient Self Care Activities:  . Is able to complete documentation independently . Able to identify [place] / [place] . patient will review information e-mailed by LCSW Please see past updates related to this goal by clicking on the "Past Updates" button in the selected goal     . counseling       CARE PLAN ENTRY (see longitudinal plan of care for additional care plan information)  Current Barriers:  . Patient with chronic medical diagnosis acknowledges deficits with connecting to mental health provider for ongoing counseling.  . Patient is experiencing symptoms of depression, felling down, which seem to be exacerbated by her chronic condition.     . Patient needs Support, Education, and Care Coordination in order to meet unmet mental health needs  . Patient unable to pay co-pay for counseling . [facility] called patient and also noted in [place] that patient did not qualify for program collaborated with [facility] for clarification on why patient was not eligible. Clinical Social Work [employer]):  Marland Kitchen Over the next 30 days, patient will work with LCSW to reduce or manage symptoms of stress until connected for ongoing counseling.  Interventions:  . Assessed patient's needs and barriers to care, SI, protective factors and coping skills. . Discussed other options for long term counseling based on need and insurance. Assisted patient with narrowing the options down to [facility] ) called with patient on the phone and left voice message to call patient. . Reviewed mental  health medications with patient prescribed by PCP and discussed compliance ( patient reports no missed dose) . Collaborated with [facility] . Other interventions include: Motivational Interviewing ;Emotional/Supportive Counseling; Patient Self Care Activities & Deficits:  . Patient is  unable to independently navigate community resource options without care coordination support . Patient is motivated for treatment Please see past updates related to this goal by clicking on the "Past Updates" button in the selected goal        Outpatient Encounter Medications as of 09/14/2020  Medication Sig  . ACCU-CHEK GUIDE test strip   . acetaminophen (TYLENOL) 500 MG tablet Take 1,000 mg by mouth every 8 (eight) hours as needed for mild pain.   Marland Kitchen amLODipine (NORVASC) 5 MG tablet Take 1 tablet (5 mg total) by mouth daily.  Marland Kitchen atorvastatin (LIPITOR) 80 MG tablet Take 1 tablet (80 mg total) by mouth every evening.  . Continuous Blood Gluc Transmit (DEXCOM G6 TRANSMITTER) MISC by Does not apply route. Checks her blood sugar  . CRANBERRY EXTRACT PO Take 1 capsule by mouth daily.  Marland Kitchen ELIQUIS 5 MG TABS tablet TAKE 1 TABLET BY MOUTH TWICE DAILY  . fexofenadine (ALLEGRA) 180 MG tablet Take 180 mg by mouth daily as needed for allergies or rhinitis.  Marland Kitchen FLUoxetine (PROZAC) 10 MG capsule Take 1 capsule (10 mg total) by mouth daily. Take with the 20 mg dose to equal 30 mg daily  . FLUoxetine (PROZAC) 20 MG tablet TAKE 1 TABLET BY MOUTH EVERY DAY ALONG WITH 10 MG FOR A TOTAL DOSE OF 30 MG EVERY DAY  . gabapentin (NEURONTIN) 300 MG capsule Take 1 capsule (300 mg total) by mouth 3 (three) times daily.  . insulin aspart (NOVOLOG) 100 UNIT/ML injection Inject 20-30 Units into the skin daily. To be used in patient's insulin pump  . Insulin Human (INSULIN PUMP) SOLN Inject 1 each into the skin continuous. Novolog insulin  . levothyroxine (SYNTHROID, LEVOTHROID) 175 MCG tablet Take 175 mcg by mouth daily before breakfast.   . loperamide (IMODIUM A-D) 2 MG tablet Take 1 tablet (2 mg total) by mouth 4 (four) times daily as needed for diarrhea or loose stools.  Marland Kitchen losartan (COZAAR) 100 MG tablet Take 1 tablet (100 mg total) by mouth daily.  . Multiple Vitamins-Minerals (OCUVITE PO) Take 1 tablet by mouth daily.  .  ondansetron (ZOFRAN-ODT) 4 MG disintegrating tablet Take 4 mg by mouth every 8 (eight) hours as needed for nausea or vomiting.  . pantoprazole (PROTONIX) 40 MG tablet Take 1 tablet (40 mg total) by mouth daily. (Patient taking differently: Take 40 mg by mouth 2 (two) times daily. )  . potassium chloride (KLOR-CON) 10 MEQ tablet Take 1 tablet (10 mEq total) by mouth daily.  Marland Kitchen torsemide (DEMADEX) 10 MG tablet TAKE 1 TABLET(10 MG) BY MOUTH DAILY AS NEEDED  . [DISCONTINUED] losartan (COZAAR) 25 MG tablet Take 1 tablet (25 mg total) by mouth at bedtime.  . [DISCONTINUED] ondansetron (ZOFRAN-ODT) 4 MG disintegrating tablet DISSOLVE ONE TABLET BY MOUTH EVERY 8 HOURS AS NEEDED FOR NAUSEA AND VOMITING  . [DISCONTINUED] pantoprazole (PROTONIX) 40 MG tablet Take 1 tablet (40 mg total) by mouth daily.   No facility-administered encounter medications on file as of 09/14/2020.   Review of patient status, including review of consultants reports, relevant laboratory and other test results, and collaboration with appropriate care team members and the patient's provider was performed as part of comprehensive patient evaluation and provision of care management services.  Casimer Lanius, [place] / [place]   [phone] 2:42 PM

## 2020-09-15 DIAGNOSIS — E119 Type 2 diabetes mellitus without complications: Secondary | ICD-10-CM | POA: Diagnosis not present

## 2020-09-15 DIAGNOSIS — F0631 Mood disorder due to known physiological condition with depressive features: Secondary | ICD-10-CM | POA: Diagnosis not present

## 2020-09-15 DIAGNOSIS — M160 Bilateral primary osteoarthritis of hip: Secondary | ICD-10-CM | POA: Diagnosis not present

## 2020-09-15 DIAGNOSIS — I69354 Hemiplegia and hemiparesis following cerebral infarction affecting left non-dominant side: Secondary | ICD-10-CM | POA: Diagnosis not present

## 2020-09-15 DIAGNOSIS — M25612 Stiffness of left shoulder, not elsewhere classified: Secondary | ICD-10-CM | POA: Diagnosis not present

## 2020-09-15 DIAGNOSIS — I4891 Unspecified atrial fibrillation: Secondary | ICD-10-CM | POA: Diagnosis not present

## 2020-09-15 DIAGNOSIS — G3184 Mild cognitive impairment, so stated: Secondary | ICD-10-CM | POA: Diagnosis not present

## 2020-09-15 DIAGNOSIS — I69398 Other sequelae of cerebral infarction: Secondary | ICD-10-CM | POA: Diagnosis not present

## 2020-09-15 DIAGNOSIS — I10 Essential (primary) hypertension: Secondary | ICD-10-CM | POA: Diagnosis not present

## 2020-09-17 DIAGNOSIS — I10 Essential (primary) hypertension: Secondary | ICD-10-CM | POA: Diagnosis not present

## 2020-09-17 DIAGNOSIS — F0631 Mood disorder due to known physiological condition with depressive features: Secondary | ICD-10-CM | POA: Diagnosis not present

## 2020-09-17 DIAGNOSIS — I4891 Unspecified atrial fibrillation: Secondary | ICD-10-CM | POA: Diagnosis not present

## 2020-09-17 DIAGNOSIS — I69398 Other sequelae of cerebral infarction: Secondary | ICD-10-CM | POA: Diagnosis not present

## 2020-09-17 DIAGNOSIS — I69354 Hemiplegia and hemiparesis following cerebral infarction affecting left non-dominant side: Secondary | ICD-10-CM | POA: Diagnosis not present

## 2020-09-17 DIAGNOSIS — E119 Type 2 diabetes mellitus without complications: Secondary | ICD-10-CM | POA: Diagnosis not present

## 2020-09-17 DIAGNOSIS — M160 Bilateral primary osteoarthritis of hip: Secondary | ICD-10-CM | POA: Diagnosis not present

## 2020-09-17 DIAGNOSIS — M25612 Stiffness of left shoulder, not elsewhere classified: Secondary | ICD-10-CM | POA: Diagnosis not present

## 2020-09-17 DIAGNOSIS — G3184 Mild cognitive impairment, so stated: Secondary | ICD-10-CM | POA: Diagnosis not present

## 2020-09-20 DIAGNOSIS — E119 Type 2 diabetes mellitus without complications: Secondary | ICD-10-CM | POA: Diagnosis not present

## 2020-09-20 DIAGNOSIS — M160 Bilateral primary osteoarthritis of hip: Secondary | ICD-10-CM | POA: Diagnosis not present

## 2020-09-20 DIAGNOSIS — F0631 Mood disorder due to known physiological condition with depressive features: Secondary | ICD-10-CM | POA: Diagnosis not present

## 2020-09-20 DIAGNOSIS — I4891 Unspecified atrial fibrillation: Secondary | ICD-10-CM | POA: Diagnosis not present

## 2020-09-20 DIAGNOSIS — I10 Essential (primary) hypertension: Secondary | ICD-10-CM | POA: Diagnosis not present

## 2020-09-20 DIAGNOSIS — M25612 Stiffness of left shoulder, not elsewhere classified: Secondary | ICD-10-CM | POA: Diagnosis not present

## 2020-09-20 DIAGNOSIS — I69398 Other sequelae of cerebral infarction: Secondary | ICD-10-CM | POA: Diagnosis not present

## 2020-09-20 DIAGNOSIS — I69354 Hemiplegia and hemiparesis following cerebral infarction affecting left non-dominant side: Secondary | ICD-10-CM | POA: Diagnosis not present

## 2020-09-20 DIAGNOSIS — G3184 Mild cognitive impairment, so stated: Secondary | ICD-10-CM | POA: Diagnosis not present

## 2020-09-21 ENCOUNTER — Ambulatory Visit: Payer: Medicare PPO | Admitting: Licensed Clinical Social Worker

## 2020-09-21 DIAGNOSIS — G3184 Mild cognitive impairment, so stated: Secondary | ICD-10-CM | POA: Diagnosis not present

## 2020-09-21 DIAGNOSIS — M160 Bilateral primary osteoarthritis of hip: Secondary | ICD-10-CM | POA: Diagnosis not present

## 2020-09-21 DIAGNOSIS — I10 Essential (primary) hypertension: Secondary | ICD-10-CM | POA: Diagnosis not present

## 2020-09-21 DIAGNOSIS — Z7189 Other specified counseling: Secondary | ICD-10-CM

## 2020-09-21 DIAGNOSIS — E119 Type 2 diabetes mellitus without complications: Secondary | ICD-10-CM | POA: Diagnosis not present

## 2020-09-21 DIAGNOSIS — I4891 Unspecified atrial fibrillation: Secondary | ICD-10-CM | POA: Diagnosis not present

## 2020-09-21 DIAGNOSIS — I69354 Hemiplegia and hemiparesis following cerebral infarction affecting left non-dominant side: Secondary | ICD-10-CM | POA: Diagnosis not present

## 2020-09-21 DIAGNOSIS — M25612 Stiffness of left shoulder, not elsewhere classified: Secondary | ICD-10-CM | POA: Diagnosis not present

## 2020-09-21 DIAGNOSIS — F0631 Mood disorder due to known physiological condition with depressive features: Secondary | ICD-10-CM | POA: Diagnosis not present

## 2020-09-21 DIAGNOSIS — I69398 Other sequelae of cerebral infarction: Secondary | ICD-10-CM | POA: Diagnosis not present

## 2020-09-21 NOTE — Chronic Care Management (AMB) (Signed)
Care Management   Clinical Social Work Follow Up   09/21/2020 Name: [name] MRN: [id number] DOB: June 27, 1951 Referred by: Eulis Foster, MD  Reason for referral : Care Coordination (F/U call)  [name] Daley Mooradian is a 69 y.o. year old female who is a primary care patient of [name], [name], MD.  Reason for follow-up: assess for barriers and progress with care plan .    Assessment: Patient is making progress towards goal. see care plan below.   Patient would like continued follow-up from CCM LCSW. Plan:  1. [facility] referral placed for [facility]  2.   LCSW will F/U with patient in 2 to 3 weeks Advance Directive Status:  not addressed during this encounter.  SDOH (Social Determinants of Health) assessments performed: No new needs identified   Goals Addressed            This Visit's Progress   . counseling   On track    [place] (see longitudinal plan of care for additional care plan information)  Current Barriers:  . Patient with chronic medical diagnosis acknowledges deficits with connecting to mental health provider for ongoing counseling.  . Patient is experiencing symptoms of depression, felling down, which seem to be exacerbated by her chronic condition.     . Patient needs Support, Education, and Care Coordination in order to meet unmet mental health needs  . Patient connected with [facility] will have counseling appointments weekly at 9:30  Clinical Social Work Goal(s):  Marland Kitchen Over the next 30 days, patient will work with LCSW to reduce or manage symptoms of stress until connected for ongoing counseling.  Interventions:  . Assessed patient's needs,concerns and how progressing . Discussed other options for long term counseling based on need and insurance. Assisted patient with narrowing the options down to  . Reviewed mental health medications with patient prescribed by PCP and discussed compliance (  patient reports no missed dose) . Other interventions include: Motivational Interviewing ;Emotional/Supportive Counseling; Patient Self Care Activities & Deficits:  . Patient is unable to independently navigate community resource options without care coordination support . Patient is motivated for treatment Please see past updates related to this goal by clicking on the "Past Updates" button in the selected goal      . Home modification       CARE PLAN ENTRY (see longitudinal plan of care for additional care plan information)  Current Barriers:  . Patient in wheelchair needs community resources to assist with home modifications  . Patient acknowledges deficits and needs support, education and care coordination in order to meet this unmet need  . Patient unable to afford modifications Clinical Goal(s)  . Over the next 60 days, patient will work with [facility] for home modification if approved for program Interventions provided by LCSW:  . Assessment of needs and barriers to care as well as how impacting    . Provided patient with information about [facility] program;  . Received permission to make referral via [facility].  Also mailed patient a brochure for the program Patient Self Care Activities & Deficits:  . Patient is unable to independently navigate community resource options without care coordination support  . Patient is motivated to resolve concern  Initial goal documentation      Outpatient Encounter Medications as of 09/21/2020  Medication Sig  . ACCU-CHEK GUIDE test strip   . acetaminophen (TYLENOL) 500 MG tablet Take 1,000 mg by mouth every 8 (eight)  hours as needed for mild pain.   Marland Kitchen amLODipine (NORVASC) 5 MG tablet Take 1 tablet (5 mg total) by mouth daily.  Marland Kitchen atorvastatin (LIPITOR) 80 MG tablet Take 1 tablet (80 mg total) by mouth every evening.  . Continuous Blood Gluc Transmit (DEXCOM G6 TRANSMITTER) MISC by Does not apply route. Checks her blood sugar    . CRANBERRY EXTRACT PO Take 1 capsule by mouth daily.  Marland Kitchen ELIQUIS 5 MG TABS tablet TAKE 1 TABLET BY MOUTH TWICE DAILY  . fexofenadine (ALLEGRA) 180 MG tablet Take 180 mg by mouth daily as needed for allergies or rhinitis.  Marland Kitchen FLUoxetine (PROZAC) 10 MG capsule Take 1 capsule (10 mg total) by mouth daily. Take with the 20 mg dose to equal 30 mg daily  . FLUoxetine (PROZAC) 20 MG tablet TAKE 1 TABLET BY MOUTH EVERY DAY ALONG WITH 10 MG FOR A TOTAL DOSE OF 30 MG EVERY DAY  . gabapentin (NEURONTIN) 300 MG capsule Take 1 capsule (300 mg total) by mouth 3 (three) times daily.  . insulin aspart (NOVOLOG) 100 UNIT/ML injection Inject 20-30 Units into the skin daily. To be used in patient's insulin pump  . Insulin Human (INSULIN PUMP) SOLN Inject 1 each into the skin continuous. Novolog insulin  . levothyroxine (SYNTHROID, LEVOTHROID) 175 MCG tablet Take 175 mcg by mouth daily before breakfast.   . loperamide (IMODIUM A-D) 2 MG tablet Take 1 tablet (2 mg total) by mouth 4 (four) times daily as needed for diarrhea or loose stools.  Marland Kitchen losartan (COZAAR) 100 MG tablet Take 1 tablet (100 mg total) by mouth daily.  . Multiple Vitamins-Minerals (OCUVITE PO) Take 1 tablet by mouth daily.  . ondansetron (ZOFRAN-ODT) 4 MG disintegrating tablet Take 4 mg by mouth every 8 (eight) hours as needed for nausea or vomiting.  . pantoprazole (PROTONIX) 40 MG tablet Take 1 tablet (40 mg total) by mouth daily. (Patient taking differently: Take 40 mg by mouth 2 (two) times daily. )  . potassium chloride (KLOR-CON) 10 MEQ tablet Take 1 tablet (10 mEq total) by mouth daily.  Marland Kitchen torsemide (DEMADEX) 10 MG tablet TAKE 1 TABLET(10 MG) BY MOUTH DAILY AS NEEDED  . [DISCONTINUED] losartan (COZAAR) 25 MG tablet Take 1 tablet (25 mg total) by mouth at bedtime.  . [DISCONTINUED] ondansetron (ZOFRAN-ODT) 4 MG disintegrating tablet DISSOLVE ONE TABLET BY MOUTH EVERY 8 HOURS AS NEEDED FOR NAUSEA AND VOMITING  . [DISCONTINUED] pantoprazole  (PROTONIX) 40 MG tablet Take 1 tablet (40 mg total) by mouth daily.   No facility-administered encounter medications on file as of 09/21/2020.   Review of patient status, including review of consultants reports, relevant laboratory and other test results, and collaboration with appropriate care team members and the patient's provider was performed as part of comprehensive patient evaluation and provision of care management services.    Casimer Lanius, [place] / [place]   [phone] 2:51 PM

## 2020-09-23 DIAGNOSIS — F0631 Mood disorder due to known physiological condition with depressive features: Secondary | ICD-10-CM | POA: Diagnosis not present

## 2020-09-23 DIAGNOSIS — I10 Essential (primary) hypertension: Secondary | ICD-10-CM | POA: Diagnosis not present

## 2020-09-23 DIAGNOSIS — G3184 Mild cognitive impairment, so stated: Secondary | ICD-10-CM | POA: Diagnosis not present

## 2020-09-23 DIAGNOSIS — M160 Bilateral primary osteoarthritis of hip: Secondary | ICD-10-CM | POA: Diagnosis not present

## 2020-09-23 DIAGNOSIS — I4891 Unspecified atrial fibrillation: Secondary | ICD-10-CM | POA: Diagnosis not present

## 2020-09-23 DIAGNOSIS — I69398 Other sequelae of cerebral infarction: Secondary | ICD-10-CM | POA: Diagnosis not present

## 2020-09-23 DIAGNOSIS — M25612 Stiffness of left shoulder, not elsewhere classified: Secondary | ICD-10-CM | POA: Diagnosis not present

## 2020-09-23 DIAGNOSIS — E119 Type 2 diabetes mellitus without complications: Secondary | ICD-10-CM | POA: Diagnosis not present

## 2020-09-23 DIAGNOSIS — I69354 Hemiplegia and hemiparesis following cerebral infarction affecting left non-dominant side: Secondary | ICD-10-CM | POA: Diagnosis not present

## 2020-09-24 DIAGNOSIS — M25612 Stiffness of left shoulder, not elsewhere classified: Secondary | ICD-10-CM | POA: Diagnosis not present

## 2020-09-24 DIAGNOSIS — F0631 Mood disorder due to known physiological condition with depressive features: Secondary | ICD-10-CM | POA: Diagnosis not present

## 2020-09-24 DIAGNOSIS — I69398 Other sequelae of cerebral infarction: Secondary | ICD-10-CM | POA: Diagnosis not present

## 2020-09-24 DIAGNOSIS — R296 Repeated falls: Secondary | ICD-10-CM | POA: Diagnosis not present

## 2020-09-24 DIAGNOSIS — E119 Type 2 diabetes mellitus without complications: Secondary | ICD-10-CM | POA: Diagnosis not present

## 2020-09-24 DIAGNOSIS — I10 Essential (primary) hypertension: Secondary | ICD-10-CM | POA: Diagnosis not present

## 2020-09-24 DIAGNOSIS — G3184 Mild cognitive impairment, so stated: Secondary | ICD-10-CM | POA: Diagnosis not present

## 2020-09-24 DIAGNOSIS — I69354 Hemiplegia and hemiparesis following cerebral infarction affecting left non-dominant side: Secondary | ICD-10-CM | POA: Diagnosis not present

## 2020-09-24 DIAGNOSIS — M160 Bilateral primary osteoarthritis of hip: Secondary | ICD-10-CM | POA: Diagnosis not present

## 2020-09-24 DIAGNOSIS — I4891 Unspecified atrial fibrillation: Secondary | ICD-10-CM | POA: Diagnosis not present

## 2020-09-27 DIAGNOSIS — I69354 Hemiplegia and hemiparesis following cerebral infarction affecting left non-dominant side: Secondary | ICD-10-CM | POA: Diagnosis not present

## 2020-09-27 DIAGNOSIS — F0631 Mood disorder due to known physiological condition with depressive features: Secondary | ICD-10-CM | POA: Diagnosis not present

## 2020-09-27 DIAGNOSIS — G3184 Mild cognitive impairment, so stated: Secondary | ICD-10-CM | POA: Diagnosis not present

## 2020-09-27 DIAGNOSIS — M160 Bilateral primary osteoarthritis of hip: Secondary | ICD-10-CM | POA: Diagnosis not present

## 2020-09-27 DIAGNOSIS — I10 Essential (primary) hypertension: Secondary | ICD-10-CM | POA: Diagnosis not present

## 2020-09-27 DIAGNOSIS — M25612 Stiffness of left shoulder, not elsewhere classified: Secondary | ICD-10-CM | POA: Diagnosis not present

## 2020-09-27 DIAGNOSIS — I4891 Unspecified atrial fibrillation: Secondary | ICD-10-CM | POA: Diagnosis not present

## 2020-09-27 DIAGNOSIS — E119 Type 2 diabetes mellitus without complications: Secondary | ICD-10-CM | POA: Diagnosis not present

## 2020-09-27 DIAGNOSIS — I69398 Other sequelae of cerebral infarction: Secondary | ICD-10-CM | POA: Diagnosis not present

## 2020-09-28 DIAGNOSIS — E119 Type 2 diabetes mellitus without complications: Secondary | ICD-10-CM | POA: Diagnosis not present

## 2020-09-28 DIAGNOSIS — I69398 Other sequelae of cerebral infarction: Secondary | ICD-10-CM | POA: Diagnosis not present

## 2020-09-28 DIAGNOSIS — M160 Bilateral primary osteoarthritis of hip: Secondary | ICD-10-CM | POA: Diagnosis not present

## 2020-09-28 DIAGNOSIS — M25612 Stiffness of left shoulder, not elsewhere classified: Secondary | ICD-10-CM | POA: Diagnosis not present

## 2020-09-28 DIAGNOSIS — F0631 Mood disorder due to known physiological condition with depressive features: Secondary | ICD-10-CM | POA: Diagnosis not present

## 2020-09-28 DIAGNOSIS — G3184 Mild cognitive impairment, so stated: Secondary | ICD-10-CM | POA: Diagnosis not present

## 2020-09-28 DIAGNOSIS — I4891 Unspecified atrial fibrillation: Secondary | ICD-10-CM | POA: Diagnosis not present

## 2020-09-28 DIAGNOSIS — I69354 Hemiplegia and hemiparesis following cerebral infarction affecting left non-dominant side: Secondary | ICD-10-CM | POA: Diagnosis not present

## 2020-09-28 DIAGNOSIS — I10 Essential (primary) hypertension: Secondary | ICD-10-CM | POA: Diagnosis not present

## 2020-09-29 DIAGNOSIS — I4891 Unspecified atrial fibrillation: Secondary | ICD-10-CM | POA: Diagnosis not present

## 2020-09-29 DIAGNOSIS — F0631 Mood disorder due to known physiological condition with depressive features: Secondary | ICD-10-CM | POA: Diagnosis not present

## 2020-09-29 DIAGNOSIS — I10 Essential (primary) hypertension: Secondary | ICD-10-CM | POA: Diagnosis not present

## 2020-09-29 DIAGNOSIS — M160 Bilateral primary osteoarthritis of hip: Secondary | ICD-10-CM | POA: Diagnosis not present

## 2020-09-29 DIAGNOSIS — M25612 Stiffness of left shoulder, not elsewhere classified: Secondary | ICD-10-CM | POA: Diagnosis not present

## 2020-09-29 DIAGNOSIS — G3184 Mild cognitive impairment, so stated: Secondary | ICD-10-CM | POA: Diagnosis not present

## 2020-09-29 DIAGNOSIS — I69354 Hemiplegia and hemiparesis following cerebral infarction affecting left non-dominant side: Secondary | ICD-10-CM | POA: Diagnosis not present

## 2020-09-29 DIAGNOSIS — I69398 Other sequelae of cerebral infarction: Secondary | ICD-10-CM | POA: Diagnosis not present

## 2020-09-29 DIAGNOSIS — E119 Type 2 diabetes mellitus without complications: Secondary | ICD-10-CM | POA: Diagnosis not present

## 2020-09-30 DIAGNOSIS — E119 Type 2 diabetes mellitus without complications: Secondary | ICD-10-CM | POA: Diagnosis not present

## 2020-09-30 DIAGNOSIS — I10 Essential (primary) hypertension: Secondary | ICD-10-CM | POA: Diagnosis not present

## 2020-09-30 DIAGNOSIS — F0631 Mood disorder due to known physiological condition with depressive features: Secondary | ICD-10-CM | POA: Diagnosis not present

## 2020-09-30 DIAGNOSIS — M25612 Stiffness of left shoulder, not elsewhere classified: Secondary | ICD-10-CM | POA: Diagnosis not present

## 2020-09-30 DIAGNOSIS — I4891 Unspecified atrial fibrillation: Secondary | ICD-10-CM | POA: Diagnosis not present

## 2020-09-30 DIAGNOSIS — I69398 Other sequelae of cerebral infarction: Secondary | ICD-10-CM | POA: Diagnosis not present

## 2020-09-30 DIAGNOSIS — M160 Bilateral primary osteoarthritis of hip: Secondary | ICD-10-CM | POA: Diagnosis not present

## 2020-09-30 DIAGNOSIS — I69354 Hemiplegia and hemiparesis following cerebral infarction affecting left non-dominant side: Secondary | ICD-10-CM | POA: Diagnosis not present

## 2020-09-30 DIAGNOSIS — G3184 Mild cognitive impairment, so stated: Secondary | ICD-10-CM | POA: Diagnosis not present

## 2020-10-05 DIAGNOSIS — G3184 Mild cognitive impairment, so stated: Secondary | ICD-10-CM | POA: Diagnosis not present

## 2020-10-05 DIAGNOSIS — E119 Type 2 diabetes mellitus without complications: Secondary | ICD-10-CM | POA: Diagnosis not present

## 2020-10-05 DIAGNOSIS — I69398 Other sequelae of cerebral infarction: Secondary | ICD-10-CM | POA: Diagnosis not present

## 2020-10-05 DIAGNOSIS — I10 Essential (primary) hypertension: Secondary | ICD-10-CM | POA: Diagnosis not present

## 2020-10-05 DIAGNOSIS — M160 Bilateral primary osteoarthritis of hip: Secondary | ICD-10-CM | POA: Diagnosis not present

## 2020-10-05 DIAGNOSIS — M25612 Stiffness of left shoulder, not elsewhere classified: Secondary | ICD-10-CM | POA: Diagnosis not present

## 2020-10-05 DIAGNOSIS — I4891 Unspecified atrial fibrillation: Secondary | ICD-10-CM | POA: Diagnosis not present

## 2020-10-05 DIAGNOSIS — F0631 Mood disorder due to known physiological condition with depressive features: Secondary | ICD-10-CM | POA: Diagnosis not present

## 2020-10-05 DIAGNOSIS — I69354 Hemiplegia and hemiparesis following cerebral infarction affecting left non-dominant side: Secondary | ICD-10-CM | POA: Diagnosis not present

## 2020-10-06 DIAGNOSIS — I69398 Other sequelae of cerebral infarction: Secondary | ICD-10-CM | POA: Diagnosis not present

## 2020-10-06 DIAGNOSIS — I4891 Unspecified atrial fibrillation: Secondary | ICD-10-CM | POA: Diagnosis not present

## 2020-10-06 DIAGNOSIS — M25612 Stiffness of left shoulder, not elsewhere classified: Secondary | ICD-10-CM | POA: Diagnosis not present

## 2020-10-06 DIAGNOSIS — M160 Bilateral primary osteoarthritis of hip: Secondary | ICD-10-CM | POA: Diagnosis not present

## 2020-10-06 DIAGNOSIS — G3184 Mild cognitive impairment, so stated: Secondary | ICD-10-CM | POA: Diagnosis not present

## 2020-10-06 DIAGNOSIS — I10 Essential (primary) hypertension: Secondary | ICD-10-CM | POA: Diagnosis not present

## 2020-10-06 DIAGNOSIS — E119 Type 2 diabetes mellitus without complications: Secondary | ICD-10-CM | POA: Diagnosis not present

## 2020-10-06 DIAGNOSIS — F0631 Mood disorder due to known physiological condition with depressive features: Secondary | ICD-10-CM | POA: Diagnosis not present

## 2020-10-06 DIAGNOSIS — I69354 Hemiplegia and hemiparesis following cerebral infarction affecting left non-dominant side: Secondary | ICD-10-CM | POA: Diagnosis not present

## 2020-10-07 ENCOUNTER — Ambulatory Visit: Payer: Medicare PPO | Admitting: Cardiology

## 2020-10-07 ENCOUNTER — Ambulatory Visit (INDEPENDENT_AMBULATORY_CARE_PROVIDER_SITE_OTHER): Payer: Medicare PPO

## 2020-10-07 ENCOUNTER — Other Ambulatory Visit: Payer: Self-pay

## 2020-10-07 ENCOUNTER — Encounter: Payer: Self-pay | Admitting: Cardiology

## 2020-10-07 ENCOUNTER — Encounter: Payer: Medicare PPO | Admitting: Cardiology

## 2020-10-07 VITALS — BP 120/60 | HR 71 | Ht 67.0 in | Wt 231.0 lb

## 2020-10-07 DIAGNOSIS — I442 Atrioventricular block, complete: Secondary | ICD-10-CM

## 2020-10-07 LAB — CUP PACEART REMOTE DEVICE CHECK
Battery Remaining Longevity: 129 mo
Battery Voltage: 3.14 V
Brady Statistic AP VP Percent: 19.04 %
Brady Statistic AP VS Percent: 16.53 %
Brady Statistic AS VP Percent: 24.08 %
Brady Statistic AS VS Percent: 40.36 %
Brady Statistic RA Percent Paced: 36.27 %
Brady Statistic RV Percent Paced: 43.1 %
Date Time Interrogation Session: 20211014022002
Implantable Lead Implant Date: 20210702
Implantable Lead Implant Date: 20210702
Implantable Lead Location: 753859
Implantable Lead Location: 753860
Implantable Lead Model: 5076
Implantable Lead Model: 5076
Implantable Pulse Generator Implant Date: 20210702
Lead Channel Impedance Value: 285 Ohm
Lead Channel Impedance Value: 342 Ohm
Lead Channel Impedance Value: 380 Ohm
Lead Channel Impedance Value: 437 Ohm
Lead Channel Pacing Threshold Amplitude: 0.625 V
Lead Channel Pacing Threshold Amplitude: 0.75 V
Lead Channel Pacing Threshold Pulse Width: 0.4 ms
Lead Channel Pacing Threshold Pulse Width: 0.4 ms
Lead Channel Sensing Intrinsic Amplitude: 4 mV
Lead Channel Sensing Intrinsic Amplitude: 4 mV
Lead Channel Sensing Intrinsic Amplitude: 8.375 mV
Lead Channel Sensing Intrinsic Amplitude: 8.375 mV
Lead Channel Setting Pacing Amplitude: 2.5 V
Lead Channel Setting Pacing Amplitude: 3.5 V
Lead Channel Setting Pacing Pulse Width: 0.4 ms
Lead Channel Setting Sensing Sensitivity: 1.2 mV

## 2020-10-07 MED ORDER — APIXABAN 5 MG PO TABS
5.0000 mg | ORAL_TABLET | Freq: Two times a day (BID) | ORAL | 9 refills | Status: DC
Start: 2020-10-07 — End: 2021-08-30

## 2020-10-07 NOTE — Patient Instructions (Signed)
Medication Instructions:  Your physician recommends that you continue on your current medications as directed. Please refer to the Current Medication list given to you today.  *If you need a refill on your cardiac medications before your next appointment, please call your pharmacy*   Lab Work: None ordered If you have labs (blood work) drawn today and your tests are completely normal, you will receive your results only by:  Goodnight (if you have MyChart) OR  A paper copy in the mail If you have any lab test that is abnormal or we need to change your treatment, we will call you to review the results.   Testing/Procedures: None ordered   Follow-Up: At [facility], you and your health needs are our priority.  As part of our continuing mission to provide you with exceptional heart care, we have created designated Provider Care Teams.  These Care Teams include your primary Cardiologist (physician) and Advanced Practice Providers (APPs -  Physician Assistants and Nurse Practitioners) who all work together to provide you with the care you need, when you need it.  We recommend signing up for the patient portal called "MyChart".  Sign up information is provided on this After Visit Summary.  MyChart is used to connect with patients for Virtual Visits (Telemedicine).  Patients are able to view lab/test results, encounter notes, upcoming appointments, etc.  Non-urgent messages can be sent to your provider as well.   To learn more about what you can do with MyChart, go to [email or web address].    Remote monitoring is used to monitor your Pacemaker or ICD from home. This monitoring reduces the number of office visits required to check your device to one time per year. It allows [place] to keep an eye on the functioning of your device to ensure it is working properly. You are scheduled for a device check from home on 01/06/2021. You may send your transmission at any time that day. If you have a  wireless device, the transmission will be sent automatically. After your physician reviews your transmission, you will receive a postcard with your next transmission date.  Your next appointment:   9 month(s)  The format for your next appointment:   In Person  Provider:   Allegra Lai, MD   Thank you for choosing [facility]!!   Trinidad Curet, RN [phone]    Other Instructions

## 2020-10-07 NOTE — Progress Notes (Signed)
Electrophysiology Office Note   Date:  10/07/2020   ID:  [name] [name], DOB 06-10-1951, MRN [id number]  PCP:  Eulis Foster, MD  Cardiologist:  Marlou Porch Primary Electrophysiologist:  Dr Curt Bears    CC: Follow up for cryptogenic stroke   History of Present Illness: [name] [name] is a 69 y.o. female who is being seen today for the evaluation of PACs at the request of [name], [facility]*. Presenting today for electrophysiology evaluation.  She has a history significant for hypertension, type 1 diabetes, hyperlipidemia, thyroid cancer status post thyroidectomy, prior CVA in 2016 in 2019, and GERD.  She had a prior Linq monitor implanted, that was found to have intermittent episodes of complete heart block and is now status post Medtronic dual-chamber pacemaker implanted 06/25/2020.    Today, denies symptoms of palpitations, chest pain, shortness of breath, orthopnea, PND, lower extremity edema, claudication, dizziness, presyncope, syncope, bleeding, or neurologic sequela. The patient is tolerating medications without difficulties.    Past Medical History:  Diagnosis Date  . Abnormal echocardiogram   . Anemia   . Arthritis   . BPPV (benign paroxysmal positional vertigo), right 06/13/2017  . Bradycardia 03/07/2020  . Cerebrovascular accident (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 ([place]) 10/29/2015  . CHB (complete heart block) ([place]) 03/08/2020  . CHF (congestive heart failure) ([place])   . Closed fracture of left proximal humerus 07/08/2019  . Decreased range of motion of left shoulder 09/03/2020  . Depression   . Dizzy 03/07/2020   bradycardia  . Edentulous 09/03/2020  . GERD (gastroesophageal reflux disease)   . Hemiparesis affecting left side as late effect of cerebrovascular accident (CVA) ([place]) 03/08/2020  . Hemorrhoids   . History of Falls with injury 03/08/2020   Fall resulting in left humeral fracture  . Hyperlipidemia   . Hypertension   .  Hypothyroidism   . Intracranial vascular stenosis   . Near syncope 03/08/2020  . Proteinuria 03/08/2020  . Seasonal allergies   . Stroke ([place])   . Thyroid cancer ([place])    per pt S/p Total Thyroidectomy with Radioactive Iodine Therapy  . Type 1 diabetes mellitus ([place])    Past Surgical History:  Procedure Laterality Date  . ABDOMINAL ANGIOGRAM N/A 05/14/2012   Procedure: ABDOMINAL ANGIOGRAM;  Surgeon: Laverda Page, MD;  Location: [facility] CATH LAB;  Service: Cardiovascular;  Laterality: N/A;  . ABDOMINAL HYSTERECTOMY  1995   partial  . ANKLE FRACTURE SURGERY Right   . CARDIAC CATHETERIZATION     with coronary angiogram  . cataract  Bilateral 2018  . COLONOSCOPY N/A 09/09/2014   Procedure: COLONOSCOPY;  Surgeon: Gatha Mayer, MD;  Location: [place];  Service: Endoscopy;  Laterality: N/A;  . ESOPHAGOGASTRODUODENOSCOPY (EGD) WITH PROPOFOL N/A 12/03/2018   Procedure: ESOPHAGOGASTRODUODENOSCOPY (EGD) WITH PROPOFOL;  Surgeon: Doran Stabler, MD;  Location: [place];  Service: Gastroenterology;  Laterality: N/A;  . LEFT AND RIGHT HEART CATHETERIZATION WITH CORONARY ANGIOGRAM N/A 05/14/2012   Procedure: LEFT AND RIGHT HEART CATHETERIZATION WITH CORONARY ANGIOGRAM;  Surgeon: Laverda Page, MD;  Location: [facility] CATH LAB;  Service: Cardiovascular;  Laterality: N/A;  . LOOP RECORDER INSERTION N/A 12/23/2018   Procedure: LOOP RECORDER INSERTION;  Surgeon: Constance Haw, MD;  Location: [place] CV LAB;  Service: Cardiovascular;  Laterality: N/A;  . LOOP RECORDER REMOVAL N/A 06/25/2020   Procedure: LOOP RECORDER REMOVAL;  Surgeon: Constance Haw, MD;  Location: [place] CV LAB;  Service: Cardiovascular;  Laterality: N/A;  .  MINI METER   09/08/2014   ELECTRONIC INSULIN PUMP  . PACEMAKER IMPLANT N/A 06/25/2020   Procedure: PACEMAKER IMPLANT;  Surgeon: Constance Haw, MD;  Location: [place] CV LAB;  Service: Cardiovascular;  Laterality: N/A;  . TEE WITHOUT  CARDIOVERSION N/A 10/16/2018   Procedure: TRANSESOPHAGEAL ECHOCARDIOGRAM (TEE);  Surgeon: Buford Dresser, MD;  Location: [facility] ENDOSCOPY;  Service: Cardiovascular;  Laterality: N/A;  . THYROIDECTOMY  2006     Current Outpatient Medications  Medication Sig Dispense Refill  . ACCU-CHEK GUIDE test strip     . acetaminophen (TYLENOL) 500 MG tablet Take 1,000 mg by mouth every 8 (eight) hours as needed for mild pain.     Marland Kitchen amLODipine (NORVASC) 5 MG tablet Take 1 tablet (5 mg total) by mouth daily. 90 tablet 1  . atorvastatin (LIPITOR) 80 MG tablet Take 1 tablet (80 mg total) by mouth every evening. 90 tablet 2  . Continuous Blood Gluc Transmit (DEXCOM G6 TRANSMITTER) MISC by Does not apply route. Checks her blood sugar    . CRANBERRY EXTRACT PO Take 1 capsule by mouth daily.    Marland Kitchen ELIQUIS 5 MG TABS tablet TAKE 1 TABLET BY MOUTH TWICE DAILY 60 tablet 6  . fexofenadine (ALLEGRA) 180 MG tablet Take 180 mg by mouth daily as needed for allergies or rhinitis.    Marland Kitchen FLUoxetine (PROZAC) 10 MG capsule Take 1 capsule (10 mg total) by mouth daily. Take with the 20 mg dose to equal 30 mg daily 30 capsule 2  . FLUoxetine (PROZAC) 20 MG tablet TAKE 1 TABLET BY MOUTH EVERY DAY ALONG WITH 10 MG FOR A TOTAL DOSE OF 30 MG EVERY DAY 90 tablet 2  . gabapentin (NEURONTIN) 300 MG capsule Take 1 capsule (300 mg total) by mouth 3 (three) times daily. 270 capsule 2  . insulin aspart (NOVOLOG) 100 UNIT/ML injection Inject 20-30 Units into the skin daily. To be used in patient's insulin pump    . Insulin Human (INSULIN PUMP) SOLN Inject 1 each into the skin continuous. Novolog insulin 1 each 0  . levothyroxine (SYNTHROID, LEVOTHROID) 175 MCG tablet Take 175 mcg by mouth daily before breakfast.   12  . loperamide (IMODIUM A-D) 2 MG tablet Take 1 tablet (2 mg total) by mouth 4 (four) times daily as needed for diarrhea or loose stools. 30 tablet 2  . losartan (COZAAR) 100 MG tablet Take 1 tablet (100 mg total) by mouth  daily.    . Multiple Vitamins-Minerals (OCUVITE PO) Take 1 tablet by mouth daily.    . ondansetron (ZOFRAN-ODT) 4 MG disintegrating tablet Take 4 mg by mouth every 8 (eight) hours as needed for nausea or vomiting.    . pantoprazole (PROTONIX) 40 MG tablet Take 1 tablet (40 mg total) by mouth daily. (Patient taking differently: Take 40 mg by mouth 2 (two) times daily. )    . potassium chloride (KLOR-CON) 10 MEQ tablet Take 1 tablet (10 mEq total) by mouth daily. 90 tablet 1  . torsemide (DEMADEX) 10 MG tablet TAKE 1 TABLET(10 MG) BY MOUTH DAILY AS NEEDED 90 tablet 0   No current facility-administered medications for this visit.    Allergies:   Latex   Social History:  The patient  reports that she has never smoked. She has never used smokeless tobacco. She reports that she does not drink alcohol and does not use drugs.   Family History:  The patient's family history includes Diabetes in her maternal aunt; Early death in her father;  Heart attack in her maternal grandfather and mother; Heart disease in her maternal aunt, maternal grandfather, and mother; Heart failure in her maternal grandfather; Hyperlipidemia in her father and mother; Hypertension in her father and mother; Prostate cancer in her paternal uncle; Renal Disease in her mother; Stroke in her son.    ROS:  Please see the history of present illness.   Otherwise, review of systems is positive for none.   All other systems are reviewed and negative.   PHYSICAL EXAM: VS:  BP 120/60   Pulse 71   Ht 5\' 7"  (1.702 m)   Wt 231 lb (104.8 kg)   SpO2 96%   BMI 36.18 kg/m  , BMI Body mass index is 36.18 kg/m. GEN: Well nourished, well developed, in no acute distress  HEENT: normal  Neck: no JVD, carotid bruits, or masses Cardiac: RRR; no murmurs, rubs, or gallops,no edema  Respiratory:  clear to auscultation bilaterally, normal work of breathing GI: soft, nontender, nondistended, + BS MS: no deformity or atrophy  Skin: warm and dry,  device site well healed Neuro:  Strength and sensation are intact Psych: euthymic mood, full affect  EKG:  EKG is ordered today. Personal review of the ekg ordered shows sinus rhythm with PACs and intermittent ventricular pacing  Personal review of the device interrogation today. Results in [place]: 03/07/2020: TSH 2.978 03/08/2020: ALT 39 03/14/2020: Magnesium 1.6 06/25/2020: BUN 9; Creatinine, Ser 0.75; Hemoglobin 10.7; Platelets 191; Potassium 3.6; Sodium 142    Lipid Panel     Component Value Date/Time   CHOL 113 05/05/2020 1125   TRIG 35 05/05/2020 1125   HDL 57 05/05/2020 1125   CHOLHDL 2.0 05/05/2020 1125   CHOLHDL 2.2 10/14/2018 0439   VLDL 11 10/14/2018 0439   LDLCALC 46 05/05/2020 1125   LDLDIRECT 75 01/25/2012 1508     Wt Readings from Last 3 Encounters:  10/07/20 231 lb (104.8 kg)  08/17/20 233 lb 0.4 oz (105.7 kg)  07/28/20 233 lb (105.7 kg)      Other studies Reviewed: Additional studies/ records that were reviewed today include: TTE 10/14/18  Review of the above records today demonstrates:  - Left ventricle: The cavity size was normal. There was moderate   concentric hypertrophy. Systolic function was normal. The   estimated ejection fraction was in the range of 60% to 65%. Wall   motion was normal; there were no regional wall motion   abnormalities. The study is not technically sufficient to allow   evaluation of LV diastolic function. - Left atrium: The atrium was normal in size. - Right atrium: The atrium was normal in size.  Cardiac monitor 10/28/18 - personally reviewed PAC's, 1 episodes NSVT 5 beats, no atrial fibrillation  ASSESSMENT AND PLAN:  1.  Cryptogenic stroke: Has had strokes in 2016 and 2019.  No atrial fibrillation noted.    2.  Hypertension: Currently well controlled  3.  Hyperlipidemia: Continue statin  4.  Complete heart block: Status post Medtronic dual-chamber pacemaker implanted 06/25/2020.  Device functioning  appropriately.  Device adjusted for chronic settings.  Current medicines are reviewed at length with the patient today.   The patient does not have concerns regarding her medicines.  The following changes were made today: none  Labs/ tests ordered today include:  No orders of the defined types were placed in this encounter.   Disposition: 9 months  Signed, Marionette Meskill Meredith Leeds, MD  10/07/2020 2:24 PM     [facility]  [address] [place] [address] [address] [phone] (office) [phone] (fax)

## 2020-10-08 ENCOUNTER — Ambulatory Visit: Payer: Self-pay | Admitting: Licensed Clinical Social Worker

## 2020-10-08 DIAGNOSIS — G4733 Obstructive sleep apnea (adult) (pediatric): Secondary | ICD-10-CM | POA: Diagnosis not present

## 2020-10-08 DIAGNOSIS — Z789 Other specified health status: Secondary | ICD-10-CM

## 2020-10-08 NOTE — Chronic Care Management (AMB) (Signed)
° °  Social Work  Care Management Collaboration 10/08/2020 Name: [name] MRN: [id number] DOB: [birth year] [name] [name] is a 69 y.o. year old female who sees [name], [name], MD for primary care.    Intervention: Patient was not interviewed or contacted during this encounter.  LCSW f/u on referral placed via [email or web address] to [employer] [facility] program. As of today the referral has not been accepted.  Called and left voice message for Sharol Given at [employer].    Review of patient status, including review of consultants reports, relevant laboratory and other test results, and collaboration with appropriate care team members and the patient's provider was performed as part of comprehensive patient evaluation and provision of chronic care management services.    Plan: LCSW will f/u with patient and referral in 7 to 10 days     Goals Addressed            This Visit's Progress    Home modification   Not on track    [place] (see longitudinal plan of care for additional care plan information)  Current Barriers:   Patient in wheelchair needs community resources to assist with home modifications   Patient acknowledges deficits and needs support, education and care coordination in order to meet this unmet need   Patient unable to afford modifications Clinical Goal(s)   Over the next 60 days, patient will work with Aging [facility] for home modification if approved for program Interventions provided by LCSW:   Assessment of needs and barriers to care as well as how impacting     Provided patient with information about Aging Gracefully program;   Received permission to make referral via [place] [employer].  Also mailed patient a brochure for the program Patient Self Care Activities & Deficits:   Patient is unable to independently navigate community resource options without care coordination support   Patient is  motivated to resolve concern  Initial goal documentation      Casimer Lanius, [place] / [place]   [phone] 11:03 AM

## 2020-10-08 NOTE — Addendum Note (Signed)
Addended by: Maren Beach, Jamila Slatten A on: 10/08/2020 03:09 PM   Modules accepted: Orders

## 2020-10-09 DIAGNOSIS — G4733 Obstructive sleep apnea (adult) (pediatric): Secondary | ICD-10-CM | POA: Diagnosis not present

## 2020-10-12 ENCOUNTER — Encounter: Payer: Self-pay | Admitting: Family Medicine

## 2020-10-12 NOTE — Progress Notes (Signed)
Remote pacemaker transmission.   

## 2020-10-13 DIAGNOSIS — I69354 Hemiplegia and hemiparesis following cerebral infarction affecting left non-dominant side: Secondary | ICD-10-CM | POA: Diagnosis not present

## 2020-10-13 DIAGNOSIS — I4891 Unspecified atrial fibrillation: Secondary | ICD-10-CM | POA: Diagnosis not present

## 2020-10-13 DIAGNOSIS — M160 Bilateral primary osteoarthritis of hip: Secondary | ICD-10-CM | POA: Diagnosis not present

## 2020-10-13 DIAGNOSIS — F0631 Mood disorder due to known physiological condition with depressive features: Secondary | ICD-10-CM | POA: Diagnosis not present

## 2020-10-13 DIAGNOSIS — I10 Essential (primary) hypertension: Secondary | ICD-10-CM | POA: Diagnosis not present

## 2020-10-13 DIAGNOSIS — G3184 Mild cognitive impairment, so stated: Secondary | ICD-10-CM | POA: Diagnosis not present

## 2020-10-13 DIAGNOSIS — E119 Type 2 diabetes mellitus without complications: Secondary | ICD-10-CM | POA: Diagnosis not present

## 2020-10-13 DIAGNOSIS — M25612 Stiffness of left shoulder, not elsewhere classified: Secondary | ICD-10-CM | POA: Diagnosis not present

## 2020-10-13 DIAGNOSIS — I69398 Other sequelae of cerebral infarction: Secondary | ICD-10-CM | POA: Diagnosis not present

## 2020-10-18 DIAGNOSIS — M25612 Stiffness of left shoulder, not elsewhere classified: Secondary | ICD-10-CM | POA: Diagnosis not present

## 2020-10-18 DIAGNOSIS — M160 Bilateral primary osteoarthritis of hip: Secondary | ICD-10-CM | POA: Diagnosis not present

## 2020-10-18 DIAGNOSIS — I4891 Unspecified atrial fibrillation: Secondary | ICD-10-CM | POA: Diagnosis not present

## 2020-10-18 DIAGNOSIS — I10 Essential (primary) hypertension: Secondary | ICD-10-CM | POA: Diagnosis not present

## 2020-10-18 DIAGNOSIS — F0631 Mood disorder due to known physiological condition with depressive features: Secondary | ICD-10-CM | POA: Diagnosis not present

## 2020-10-18 DIAGNOSIS — I69354 Hemiplegia and hemiparesis following cerebral infarction affecting left non-dominant side: Secondary | ICD-10-CM | POA: Diagnosis not present

## 2020-10-18 DIAGNOSIS — E119 Type 2 diabetes mellitus without complications: Secondary | ICD-10-CM | POA: Diagnosis not present

## 2020-10-18 DIAGNOSIS — I69398 Other sequelae of cerebral infarction: Secondary | ICD-10-CM | POA: Diagnosis not present

## 2020-10-18 DIAGNOSIS — G3184 Mild cognitive impairment, so stated: Secondary | ICD-10-CM | POA: Diagnosis not present

## 2020-10-19 ENCOUNTER — Ambulatory Visit: Payer: Medicare PPO | Admitting: Licensed Clinical Social Worker

## 2020-10-19 DIAGNOSIS — Z7189 Other specified counseling: Secondary | ICD-10-CM

## 2020-10-19 NOTE — Patient Instructions (Signed)
Ms. Gerety  it was nice speaking with you. Please call me directly [phone] if you have questions about the goals we discussed. Goals Addressed            This Visit's Progress    Advance Directives   Not on track    [place] (see longitudinal plan of care for additional care plan information)  Current Barriers:   Patient does not have an Advance Directive  Patient acknowledges deficits, education and support in order to complete this document Clinical Social Work Goal(s): Over the next 30 to 60 days,   the patient will review and complete Advance Directive packet, have notarized and provide a copy to provider office  review mailed EMMI education on Advance Directive as evidenced by patient self report of review Interventions provided by LCSW:  Assessed understanding of Advance Directives  A voluntary discussion about advanced care planning including importance of advanced directives, healthcare proxy and living will was discussed with the patient.   Reviewed Advance Directives with patient and assisted with completing paperwork.  Mailed new copy to patient today Patient Self Care Activities:   Is able to complete documentation independently  Able to identify [place] / [place]  patient will review information e-mailed by LCSW Please see past updates related to this goal by clicking on the "Past Updates" button in the selected goal      counseling   On track    [place] (see longitudinal plan of care for additional care plan information)  Current Barriers:   Patient with chronic medical diagnosis acknowledges deficits with connecting to mental health provider for ongoing counseling.   Patient is experiencing symptoms of depression, felling down, which seem to be exacerbated by her chronic condition.      Patient needs Support, Education, and Care Coordination in order to meet unmet mental health needs   Patient  connected with [facility] will have counseling appointments weekly at 9:30  Clinical Social Work Goal(s):   Over the next 30 days, patient will work with LCSW to reduce or manage symptoms of stress until connected for ongoing counseling.  Interventions:   Assessed patient's needs,concerns and how progressing  Continues to talk with therapist at [facility]  Other interventions include: Motivational Interviewing ;Emotional/Supportive Counseling; Patient Self Care Activities & Deficits:   Patient is unable to independently navigate community resource options without care coordination support  Patient is motivated for treatment Please see past updates related to this goal by clicking on the "Past Updates" button in the selected goal       Explore PACE Program        Review the PACE brochure I placed in the mail  I have placed the referral for [place] with PACE to call and share more information   Congratulations on exploring options in the community      Home modification   Not on track    [place] (see longitudinal plan of care for additional care plan information)  Current Barriers:   Patient in wheelchair needs community resources to assist with home modifications   Patient acknowledges deficits and needs support, education and care coordination in order to meet this unmet need   Patient unable to afford modifications Clinical Goal(s)   Over the next 60 days, patient will work with [facility] for home modification if approved for program Interventions provided by LCSW:   Assessment of needs and barriers to  care as well as how impacting     Provided patient with information about Aging Gracefully program;   Received permission to make referral via [place] [employer].   mailed patient 2nd brochure for the program  E-mail sent to ageng gracefully coordinator Sharol Given ; referral still shows pending in [facility] Patient Self Care  Activities & Deficits:   Patient is unable to independently navigate community resource options without care coordination support   Patient is motivated to resolve concern will read brochure and f/u with agency Please see past updates related to this goal by clicking on the "Past Updates" button in the selected goal       Ms. Math received Care Management services today:  1. Care Management services include personalized support from designated clinical staff supervised by her physician, including individualized plan of care and coordination with other care providers 2. 24/7 contact [phone] for assistance for urgent and routine care needs. 3. Care Management are voluntary services and be declined at any time by calling the office.  Patient verbalizes understanding of instructions provided today.  Follow up plan: SW will follow up with patient by phone over the next 6 to 8 weeks  Maurine Cane, LCSW

## 2020-10-19 NOTE — Chronic Care Management (AMB) (Addendum)
Care Management   Clinical Social Work Follow Up   10/19/2020 Name: [name] MRN: [id number] DOB: [birth year] Referred by: [name] Foster, MD  Reason for referral : No chief complaint on file.  [name] [name] is a 69 y.o. year old female who is a primary care patient of [name], Makiera, MD.  Reason for follow-up: assess for barriers and progress with care plan goals .    Assessment: Patient is making progress towards goal. Starting to feel better.  PT and counseling are helping her a lot.  Reports being able to go out of her home to the store for the first time in a year. PT will end next week.  Patient will continue with out patient therapy.   Recommendation: Patient may benefit from, and is in agreement to hearing more information about the PACE program.  Plan: Patient would like continued follow-up. LCSW will f/u with patient in 6 to 8 weeks.  Sooner if needed. Patient will call LCSW if needed prior to outreach  Interventions provided by LCSW:  Assessment of needs, as well as how impacting, barriers , progress,  and outcome     Provided patient with information about PACE program, Aging Gracefully and advance Directive Referral placed to PACE program  F/U e-mail sent to aging gracefully Motivational Interviewing Solution-Focused Strategies  Emotional/Supportive Counseling     Advance Directive Status: N See Care Plan for related entries.  SDOH (Social Determinants of Health) assessments performed: No new needs identified    Goals Addressed             This Visit's Progress    Advance Directives   Not on track    [place] (see longitudinal plan of care for additional care plan information)  Current Barriers:  Patient does not have an Advance Directive Patient acknowledges deficits, education and support in order to complete this document Clinical Social Work Goal(s): Over the next 30 to 60 days,  the patient will  review and complete Advance Directive packet, have notarized and provide a copy to provider office review mailed EMMI education on Advance Directive as evidenced by patient self report of review Interventions provided by LCSW: Assessed understanding of Advance Directives A voluntary discussion about advanced care planning including importance of advanced directives, healthcare proxy and living will was discussed with the patient.  Reviewed Advance Directives with patient and assisted with completing paperwork.  Mailed new copy to patient today Patient Self Care Activities:  Is able to complete documentation independently Able to identify [place] / [place] patient will review information e-mailed by LCSW Please see past updates related to this goal by clicking on the "Past Updates" button in the selected goal      counseling   On track    [place] (see longitudinal plan of care for additional care plan information)  Current Barriers:  Patient with chronic medical diagnosis acknowledges deficits with connecting to mental health provider for ongoing counseling.  Patient is experiencing symptoms of depression, felling down, which seem to be exacerbated by her chronic condition.     Patient needs Support, Education, and Care Coordination in order to meet unmet mental health needs  Patient connected with [facility] will have counseling appointments weekly at 9:30  Clinical Social Work Goal(s):  Over the next 30 days, patient will work with LCSW to reduce or manage symptoms of stress until connected for ongoing counseling.  Interventions:  Assessed patient's needs,concerns and how progressing Continues to talk with therapist at [facility] Other interventions include: Motivational Interviewing ;Emotional/Supportive Counseling; Patient Self Care Activities & Deficits:  Patient is unable to independently navigate community resource  options without care coordination support Patient is motivated for treatment Please see past updates related to this goal by clicking on the "Past Updates" button in the selected goal       Explore PACE Program       Review the PACE brochure I placed in the mail I have placed the referral for [place] with PACE to call and share more information   Congratulations on exploring options in the community      Home modification   Not on track    [place] (see longitudinal plan of care for additional care plan information)  Current Barriers:  Patient in wheelchair needs community resources to assist with home modifications  Patient acknowledges deficits and needs support, education and care coordination in order to meet this unmet need  Patient unable to afford modifications Clinical Goal(s)  Over the next 60 days, patient will work with [facility]ging [facility] for home modification if approved for program Interventions provided by LCSW:  Assessment of needs and barriers to care as well as how impacting    Provided patient with information about Aging Gracefully program;  Received permission to make referral via [place] [facility].   mailed patient 2nd brochure for the program E-mail sent to ageng gracefully coordinator Sharol Given ; referral still shows pending in [facility] Patient Self Care Activities & Deficits:  Patient is unable to independently navigate community resource options without care coordination support  Patient is motivated to resolve concern will read brochure and f/u with agency Please see past updates related to this goal by clicking on the "Past Updates" button in the selected goal        Outpatient Encounter Medications as of 10/19/2020  Medication Sig   ACCU-CHEK GUIDE test strip    acetaminophen (TYLENOL) 500 MG tablet Take 1,000 mg by mouth every 8 (eight) hours as needed for mild pain.    amLODipine (NORVASC) 5 MG tablet Take 1 tablet (5 mg total) by mouth daily.    apixaban (ELIQUIS) 5 MG TABS tablet Take 1 tablet (5 mg total) by mouth 2 (two) times daily.   atorvastatin (LIPITOR) 80 MG tablet Take 1 tablet (80 mg total) by mouth every evening.   Continuous Blood Gluc Transmit (DEXCOM G6 TRANSMITTER) MISC by Does not apply route. Checks her blood sugar   CRANBERRY EXTRACT PO Take 1 capsule by mouth daily.   fexofenadine (ALLEGRA) 180 MG tablet Take 180 mg by mouth daily as needed for allergies or rhinitis.   FLUoxetine (PROZAC) 10 MG capsule Take 1 capsule (10 mg total) by mouth daily. Take with the 20 mg dose to equal 30 mg daily   FLUoxetine (PROZAC) 20 MG tablet TAKE 1 TABLET BY MOUTH EVERY DAY ALONG WITH 10 MG FOR A TOTAL DOSE OF 30 MG EVERY DAY   gabapentin (NEURONTIN) 300 MG capsule Take 1 capsule (300 mg total) by mouth 3 (three) times daily.   insulin aspart (NOVOLOG) 100 UNIT/ML injection Inject 20-30 Units into the skin daily. To be used in patient's insulin pump   Insulin Human (INSULIN PUMP) SOLN Inject 1 each into the skin continuous. Novolog insulin   levothyroxine (SYNTHROID, LEVOTHROID) 175 MCG tablet Take 175 mcg by mouth daily before breakfast.    loperamide (IMODIUM A-D) 2 MG tablet  Take 1 tablet (2 mg total) by mouth 4 (four) times daily as needed for diarrhea or loose stools.   losartan (COZAAR) 100 MG tablet Take 1 tablet (100 mg total) by mouth daily.   Multiple Vitamins-Minerals (OCUVITE PO) Take 1 tablet by mouth daily.   ondansetron (ZOFRAN-ODT) 4 MG disintegrating tablet Take 4 mg by mouth every 8 (eight) hours as needed for nausea or vomiting.   pantoprazole (PROTONIX) 40 MG tablet Take 1 tablet (40 mg total) by mouth daily. (Patient taking differently: Take 40 mg by mouth 2 (two) times daily. )   potassium chloride (KLOR-CON) 10 MEQ tablet Take 1 tablet (10 mEq total) by mouth daily.   torsemide (DEMADEX) 10 MG tablet TAKE 1 TABLET(10 MG) BY MOUTH DAILY AS NEEDED   [DISCONTINUED] losartan (COZAAR) 25 MG tablet Take 1 tablet (25 mg  total) by mouth at bedtime.   [DISCONTINUED] ondansetron (ZOFRAN-ODT) 4 MG disintegrating tablet DISSOLVE ONE TABLET BY MOUTH EVERY 8 HOURS AS NEEDED FOR NAUSEA AND VOMITING   [DISCONTINUED] pantoprazole (PROTONIX) 40 MG tablet Take 1 tablet (40 mg total) by mouth daily.   No facility-administered encounter medications on file as of 10/19/2020.   Review of patient status, including review of consultants reports, relevant laboratory and other test results, and collaboration with appropriate care team members and the patient's provider was performed as part of comprehensive patient evaluation and provision of care management services.   Casimer Lanius, [place] / [place]   [phone] 9:49 AM   I have reviewed this visit and agree with the documentation.  [name] Foster, MD [place], PGY-2 [phone]

## 2020-10-20 ENCOUNTER — Ambulatory Visit: Payer: Medicare PPO | Admitting: Gastroenterology

## 2020-10-25 DIAGNOSIS — R296 Repeated falls: Secondary | ICD-10-CM | POA: Diagnosis not present

## 2020-10-26 DIAGNOSIS — E1065 Type 1 diabetes mellitus with hyperglycemia: Secondary | ICD-10-CM | POA: Diagnosis not present

## 2020-10-27 ENCOUNTER — Telehealth: Payer: Self-pay

## 2020-10-27 DIAGNOSIS — I69354 Hemiplegia and hemiparesis following cerebral infarction affecting left non-dominant side: Secondary | ICD-10-CM | POA: Diagnosis not present

## 2020-10-27 DIAGNOSIS — I4891 Unspecified atrial fibrillation: Secondary | ICD-10-CM | POA: Diagnosis not present

## 2020-10-27 DIAGNOSIS — E119 Type 2 diabetes mellitus without complications: Secondary | ICD-10-CM | POA: Diagnosis not present

## 2020-10-27 DIAGNOSIS — M25612 Stiffness of left shoulder, not elsewhere classified: Secondary | ICD-10-CM | POA: Diagnosis not present

## 2020-10-27 DIAGNOSIS — I10 Essential (primary) hypertension: Secondary | ICD-10-CM | POA: Diagnosis not present

## 2020-10-27 DIAGNOSIS — G3184 Mild cognitive impairment, so stated: Secondary | ICD-10-CM | POA: Diagnosis not present

## 2020-10-27 DIAGNOSIS — F0631 Mood disorder due to known physiological condition with depressive features: Secondary | ICD-10-CM | POA: Diagnosis not present

## 2020-10-27 DIAGNOSIS — I69398 Other sequelae of cerebral infarction: Secondary | ICD-10-CM | POA: Diagnosis not present

## 2020-10-27 DIAGNOSIS — M160 Bilateral primary osteoarthritis of hip: Secondary | ICD-10-CM | POA: Diagnosis not present

## 2020-10-27 NOTE — Telephone Encounter (Signed)
Received phone call from Kelly-PT with [facility] regarding patient's upcoming discharge from home health PT. Claiborne Billings reports that patient will be discharged from home health by the beginning of next week. Patient would like to return to outpatient physical therapy as soon as possible.   Patient was previously being seen at [facility] on [address] Patient would like to reestablish care at this office.    To PCP  Talbot Grumbling, RN

## 2020-10-28 ENCOUNTER — Other Ambulatory Visit: Payer: Self-pay | Admitting: Family Medicine

## 2020-10-28 DIAGNOSIS — W19XXXS Unspecified fall, sequela: Secondary | ICD-10-CM

## 2020-10-28 DIAGNOSIS — R2689 Other abnormalities of gait and mobility: Secondary | ICD-10-CM

## 2020-10-28 DIAGNOSIS — I69354 Hemiplegia and hemiparesis following cerebral infarction affecting left non-dominant side: Secondary | ICD-10-CM

## 2020-10-28 NOTE — Telephone Encounter (Signed)
Referral submitted for outpatient neuro rehab

## 2020-11-01 DIAGNOSIS — I4891 Unspecified atrial fibrillation: Secondary | ICD-10-CM | POA: Diagnosis not present

## 2020-11-01 DIAGNOSIS — F0631 Mood disorder due to known physiological condition with depressive features: Secondary | ICD-10-CM | POA: Diagnosis not present

## 2020-11-01 DIAGNOSIS — M160 Bilateral primary osteoarthritis of hip: Secondary | ICD-10-CM | POA: Diagnosis not present

## 2020-11-01 DIAGNOSIS — E119 Type 2 diabetes mellitus without complications: Secondary | ICD-10-CM | POA: Diagnosis not present

## 2020-11-01 DIAGNOSIS — M25612 Stiffness of left shoulder, not elsewhere classified: Secondary | ICD-10-CM | POA: Diagnosis not present

## 2020-11-01 DIAGNOSIS — I69398 Other sequelae of cerebral infarction: Secondary | ICD-10-CM | POA: Diagnosis not present

## 2020-11-01 DIAGNOSIS — G3184 Mild cognitive impairment, so stated: Secondary | ICD-10-CM | POA: Diagnosis not present

## 2020-11-01 DIAGNOSIS — I10 Essential (primary) hypertension: Secondary | ICD-10-CM | POA: Diagnosis not present

## 2020-11-01 DIAGNOSIS — I69354 Hemiplegia and hemiparesis following cerebral infarction affecting left non-dominant side: Secondary | ICD-10-CM | POA: Diagnosis not present

## 2020-11-05 ENCOUNTER — Ambulatory Visit: Payer: Self-pay | Admitting: Licensed Clinical Social Worker

## 2020-11-05 NOTE — Chronic Care Management (AMB) (Signed)
   Social Work  Care Management Collaboration 11/05/2020 Name: [name] MRN: [id number] DOB: Oct 05, 1951 [name] [name] is a 69 y.o. year old female who sees [name], [name], MD for primary care.  Intervention: Patient was not interviewed or contacted during this encounter.  LCSW collaborated with [employer] and [facility]. Both referrals have been place and program representatives have contacted patient.   Review of patient status, including review of consultants reports, relevant laboratory and other test results, and collaboration with appropriate care team members and the patient's provider was performed as part of comprehensive patient evaluation and provision of chronic care management services.    Plan: LCSW will f/u with patient in 4 weeks to assess for barriers.  Casimer Lanius, [place] / [place]   [phone] 9:38 AM

## 2020-11-09 DIAGNOSIS — G4733 Obstructive sleep apnea (adult) (pediatric): Secondary | ICD-10-CM | POA: Diagnosis not present

## 2020-11-15 ENCOUNTER — Other Ambulatory Visit: Payer: Self-pay | Admitting: Family Medicine

## 2020-11-15 ENCOUNTER — Other Ambulatory Visit: Payer: Self-pay | Admitting: *Deleted

## 2020-11-15 MED ORDER — POTASSIUM CHLORIDE CRYS ER 10 MEQ PO TBCR
10.0000 meq | EXTENDED_RELEASE_TABLET | Freq: Every day | ORAL | 1 refills | Status: DC
Start: 2020-11-15 — End: 2021-08-30

## 2020-11-15 MED ORDER — PANTOPRAZOLE SODIUM 40 MG PO TBEC
40.0000 mg | DELAYED_RELEASE_TABLET | Freq: Two times a day (BID) | ORAL | 3 refills | Status: DC
Start: 2020-11-15 — End: 2021-08-30

## 2020-11-16 DIAGNOSIS — E1065 Type 1 diabetes mellitus with hyperglycemia: Secondary | ICD-10-CM | POA: Diagnosis not present

## 2020-11-21 ENCOUNTER — Other Ambulatory Visit: Payer: Self-pay | Admitting: Family Medicine

## 2020-11-21 ENCOUNTER — Encounter: Payer: Self-pay | Admitting: Family Medicine

## 2020-11-22 MED ORDER — LEVOTHYROXINE SODIUM 175 MCG PO TABS: 175.0000 ug | ORAL_TABLET | Freq: Every day | ORAL | 2 refills | Status: DC

## 2020-11-22 NOTE — Addendum Note (Signed)
Addended by: Adolph Pollack on: 11/22/2020 10:54 AM   Modules accepted: Orders

## 2020-11-24 DIAGNOSIS — R296 Repeated falls: Secondary | ICD-10-CM | POA: Diagnosis not present

## 2020-12-01 ENCOUNTER — Telehealth: Payer: Self-pay | Admitting: Licensed Clinical Social Worker

## 2020-12-01 NOTE — Chronic Care Management (AMB) (Signed)
    Clinical Social Work  Care Management Outreach   12/01/2020 Name: [name] MRN: [id number] DOB: Sep 21, 1951  [name] [name] is a 69 y.o. year old female who is a primary care patient of [name], [name], MD .   F/U phone call to [name] today to assess needs, and progress with care plan goals.  The outreach was unsuccessful. A HIPPA compliant phone message was left for the patient providing contact information and requesting a return call.   Plan: If no return call is received, will call again in 7 to 10 days.  Review of patient status, including review of consultants reports, relevant laboratory and other test results, and collaboration with appropriate care team members and the patient's provider was performed as part of comprehensive patient evaluation and provision of care management services.    Casimer Lanius, [place] / [place]   [phone] 9:39 AM

## 2020-12-02 ENCOUNTER — Ambulatory Visit: Payer: Medicare PPO | Admitting: Licensed Clinical Social Worker

## 2020-12-02 DIAGNOSIS — I10 Essential (primary) hypertension: Secondary | ICD-10-CM | POA: Diagnosis not present

## 2020-12-02 DIAGNOSIS — E890 Postprocedural hypothyroidism: Secondary | ICD-10-CM | POA: Diagnosis not present

## 2020-12-02 DIAGNOSIS — Z7189 Other specified counseling: Secondary | ICD-10-CM

## 2020-12-02 DIAGNOSIS — E7800 Pure hypercholesterolemia, unspecified: Secondary | ICD-10-CM | POA: Diagnosis not present

## 2020-12-02 DIAGNOSIS — I639 Cerebral infarction, unspecified: Secondary | ICD-10-CM | POA: Diagnosis not present

## 2020-12-02 DIAGNOSIS — E109 Type 1 diabetes mellitus without complications: Secondary | ICD-10-CM | POA: Diagnosis not present

## 2020-12-02 DIAGNOSIS — E11319 Type 2 diabetes mellitus with unspecified diabetic retinopathy without macular edema: Secondary | ICD-10-CM | POA: Diagnosis not present

## 2020-12-02 DIAGNOSIS — C73 Malignant neoplasm of thyroid gland: Secondary | ICD-10-CM | POA: Diagnosis not present

## 2020-12-02 DIAGNOSIS — Z9641 Presence of insulin pump (external) (internal): Secondary | ICD-10-CM | POA: Diagnosis not present

## 2020-12-02 DIAGNOSIS — Z794 Long term (current) use of insulin: Secondary | ICD-10-CM | POA: Diagnosis not present

## 2020-12-03 NOTE — Chronic Care Management (AMB) (Addendum)
Care Management  Follow Up Clinical Social Work General Note  12/03/2020 Name: [name] MRN: [id number] DOB: 05-30-1951  [name] [name] is enrolled in a Managed Medicaid plan: No. Outreach attempt today was successful.   [name] is a 69 y.o. year old female who is a primary care patient of [name], Makiera, MD. The Care Management team was consulted to assist the patient with [employer] , [place] representative and care coordination  .  She continues to talk with her therapist bi week. Reports this is helping her a lot.  See care plan below for ongoing goals.  Follow up Plan:  will f/u with patient quarterly, sooner if needed to provide updates  SDOH (Social Determinants of Health) screening performed today:  No.     Advanced Directives Status:See Care Plan for related entries       Patient Care Plan: Social Work   Problem Identified: Functional Decline    Long-Range Goal: patient will work with Aging [facility] for home modification if approved for program   Start Date: 10/08/2020  Expected End Date: 08/08/2021  This Visit's Progress: On track  Assessment, progress and Current Barriers: Patient has completed and return Aging Gracefully application. Reports she returned it late and is hopeful that they will still consider her for the program. Patient in wheelchair needs community resources to assist with home modifications  Patient acknowledges deficits and needs support, education and care coordination in order to meet this unmet need  Patient unable to afford modifications Interventions provided by LCSW:  Assessment of needs and barriers to care as well as how impacting    E-mail sent to aging gracefully coordinator [name] to see if she received information and discuss next steps  Patient Self Care Activities:  I will f/u with [facility]ging [facility] to see if they are able to process your application     Problem Identified: Long-Term Care Planning    Goal: Over the next 60 to 90 days,the patient will review Advance Directive packet, have notarized and provide a copy to provider office   Start Date: 09/09/2020  Expected End Date: 01/24/2021  This Visit's Progress: Not on track  Assessment, progress and Current Barriers: Patient received previous mailed advance directive but misplaced it.  She has identified her health care agents and would like LCSW to mail another copy of the advance directive. Patient does not have an Advance Directive Patient acknowledges deficits, education and support in order to complete this document Interventions provided by LCSW: Assessed understanding of Advance Directives A voluntary discussion about advanced care planning including importance of advanced directives, healthcare proxy and living will was discussed with the patient.  Mailed new copy to patient today Patient Self Care Activities:  will review advance directive mailed by LCSW Get family to assist with bringing in a notary     Outpatient Encounter Medications as of 12/02/2020  Medication Sig   ACCU-CHEK GUIDE test strip    acetaminophen (TYLENOL) 500 MG tablet Take 1,000 mg by mouth every 8 (eight) hours as needed for mild pain.    amLODipine (NORVASC) 5 MG tablet Take 1 tablet (5 mg total) by mouth daily.   apixaban (ELIQUIS) 5 MG TABS tablet Take 1 tablet (5 mg total) by mouth 2 (two) times daily.   atorvastatin (LIPITOR) 80 MG tablet Take 1 tablet (80 mg total) by mouth every evening.   Continuous Blood Gluc Transmit (DEXCOM G6 TRANSMITTER) MISC by Does not apply route.  Checks her blood sugar   CRANBERRY EXTRACT PO Take 1 capsule by mouth daily.   fexofenadine (ALLEGRA) 180 MG tablet Take 180 mg by mouth daily as needed for allergies or rhinitis.   FLUoxetine (PROZAC) 10 MG capsule Take 1 capsule (10 mg total) by mouth daily. Take with the 20 mg dose to equal 30 mg daily   FLUoxetine (PROZAC) 20  MG tablet TAKE 1 TABLET BY MOUTH EVERY DAY ALONG WITH 10 MG FOR A TOTAL DOSE OF 30 MG EVERY DAY   gabapentin (NEURONTIN) 300 MG capsule Take 1 capsule (300 mg total) by mouth 3 (three) times daily.   insulin aspart (NOVOLOG) 100 UNIT/ML injection Inject 20-30 Units into the skin daily. To be used in patient's insulin pump   Insulin Human (INSULIN PUMP) SOLN Inject 1 each into the skin continuous. Novolog insulin   levothyroxine (SYNTHROID) 175 MCG tablet Take 1 tablet (175 mcg total) by mouth daily before breakfast.   loperamide (IMODIUM A-D) 2 MG tablet Take 1 tablet (2 mg total) by mouth 4 (four) times daily as needed for diarrhea or loose stools.   losartan (COZAAR) 100 MG tablet Take 1 tablet (100 mg total) by mouth daily.   Multiple Vitamins-Minerals (OCUVITE PO) Take 1 tablet by mouth daily.   ondansetron (ZOFRAN-ODT) 4 MG disintegrating tablet Take 4 mg by mouth every 8 (eight) hours as needed for nausea or vomiting.   pantoprazole (PROTONIX) 40 MG tablet Take 1 tablet (40 mg total) by mouth 2 (two) times daily.   potassium chloride (KLOR-CON) 10 MEQ tablet Take 1 tablet (10 mEq total) by mouth daily.   torsemide (DEMADEX) 10 MG tablet TAKE 1 TABLET(10 MG) BY MOUTH DAILY AS NEEDED   [DISCONTINUED] losartan (COZAAR) 25 MG tablet Take 1 tablet (25 mg total) by mouth at bedtime.   [DISCONTINUED] ondansetron (ZOFRAN-ODT) 4 MG disintegrating tablet DISSOLVE ONE TABLET BY MOUTH EVERY 8 HOURS AS NEEDED FOR NAUSEA AND VOMITING   [DISCONTINUED] pantoprazole (PROTONIX) 40 MG tablet Take 1 tablet (40 mg total) by mouth daily.   No facility-administered encounter medications on file as of 12/02/2020.   [name] was [name] information about Care Management services today including:  Care Management services include personalized support from designated clinical staff supervised by her physician, including individualized plan of care and coordination with other care providers 24/7 contact phone numbers  for assistance for urgent and routine care needs. The patient may stop care management services at any time (effective at the end of the month) by phone call to the office staff.  Patient agreed to services and verbal consent obtained.   [name] Cane, LCSW  I have reviewed this visit and agree with the documentation.  Eulis Foster, MD [place], PGY-2 [phone]

## 2020-12-03 NOTE — Patient Instructions (Signed)
  [name]  it was nice speaking with you. Please call me directly [phone] if you have questions about the goals we discussed. Goals Addressed            This Visit's Progress   . Advance Directives   Not on track    Patient Self Care Activities:  . will review advance directive mailed by LCSW . Get family to assist with bringing in a notary     . COMPLETED: counseling   On track    Patient Self Care Activities :  . Congratulations on continuing counseling with [facility]    . Home modification with Aging [facility]   On track    Patient Self Care Activities:  . I will f/u with Aging [facility] to see if they are able to process your application       [name] received Care Management services today:  1. Care Management services include personalized support from designated clinical staff supervised by her physician, including individualized plan of care and coordination with other care providers 2. 24/7 contact [phone] for assistance for urgent and routine care needs. 3. Care Management are voluntary services and be declined at any time by calling the office.  Patient verbalizes understanding of instructions provided today.    Follow up plan: SW will follow up with patient by phone over the next 60 days  Maurine Cane, LCSW

## 2020-12-08 ENCOUNTER — Other Ambulatory Visit: Payer: Self-pay | Admitting: Physician Assistant

## 2020-12-09 DIAGNOSIS — G4733 Obstructive sleep apnea (adult) (pediatric): Secondary | ICD-10-CM | POA: Diagnosis not present

## 2020-12-10 ENCOUNTER — Ambulatory Visit: Payer: Medicare PPO | Admitting: Podiatry

## 2020-12-10 ENCOUNTER — Encounter: Payer: Self-pay | Admitting: Podiatry

## 2020-12-10 ENCOUNTER — Other Ambulatory Visit: Payer: Self-pay

## 2020-12-10 DIAGNOSIS — B351 Tinea unguium: Secondary | ICD-10-CM

## 2020-12-10 DIAGNOSIS — D689 Coagulation defect, unspecified: Secondary | ICD-10-CM

## 2020-12-10 DIAGNOSIS — E1159 Type 2 diabetes mellitus with other circulatory complications: Secondary | ICD-10-CM

## 2020-12-10 DIAGNOSIS — M79675 Pain in left toe(s): Secondary | ICD-10-CM | POA: Diagnosis not present

## 2020-12-10 DIAGNOSIS — M79674 Pain in right toe(s): Secondary | ICD-10-CM

## 2020-12-10 NOTE — Progress Notes (Signed)
This patient returns to my office for at risk foot care.  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  This patient is unable to cut nails herself  since the patient cannot reach her  nails.These nails are painful walking and wearing shoes.  Patient presents in a wheelchair .  This patient presents for at risk foot care today.  General Appearance  Alert, conversant and in no acute stress.  Vascular  Dorsalis pedis and posterior tibial  pulses are weakly  palpable  bilaterally.  Capillary return is within normal limits  bilaterally. Temperature is within normal limits  bilaterally.  Neurologic  Senn-Weinstein monofilament wire diminished  bilaterally. Muscle power within normal limits bilaterally.  Nails Thick disfigured discolored nails with subungual debris  from hallux to fifth toes bilaterally. No evidence of bacterial infection or drainage bilaterally.  Orthopedic  No limitations of motion  feet .  No crepitus or effusions noted.  No bony pathology or digital deformities noted.  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  Skin  normotropic skin with no porokeratosis noted bilaterally.  No signs of infections or ulcers noted.     Onychomycosis  Pain in right toes  Pain in left toes  Consent was obtained for treatment procedures.   Mechanical debridement of nails 1-5  bilaterally performed with a nail nipper.  Filed with dremel without incident. No infection or ulcer.     Return office visit    3 months       Told patient to return for periodic foot care and evaluation due to potential at risk complications.   Gardiner Barefoot DPM

## 2020-12-25 DIAGNOSIS — R296 Repeated falls: Secondary | ICD-10-CM | POA: Diagnosis not present

## 2021-01-02 NOTE — Patient Instructions (Addendum)
It was a pleasure to see you today!  Thank you for choosing [facility] for your primary care.  [name] was seen for foot pain and swelling.   Our plans for today were:  For your foot pain, I have ordered an xray. Please go to [facility] imaging to have this completed at your convenience.   I do not think your foot appears to be infected at this time but please continue to watch for any signs of redness, swelling or worsening pain in your foot or leg.   Please follow up with podiatry and make them aware of your symptoms.   I will notify you of any abnormal results on your xray  To keep you healthy, please keep in mind the following health maintenance items that you are due for:   1. Eye Exam  2. COVID 2nd Vaccine  3. Mammogram   You should return to our clinic in 73months for follow up for foot pain.   Best Wishes,   Dr. Alba Cory

## 2021-01-02 NOTE — Progress Notes (Signed)
° ° °  SUBJECTIVE:   CHIEF COMPLAINT / HPI: foot pain and swelling   [name] Ticia Virgo is a 70 y.o. female with hx of HFpEF, hx of falls, wheelchair dependence, AF, HTN and T1DM who presents today for foot swelling and pain.  Patient reports that she was seen by podiatry on 12/10/2020 and had her toenails clipped.  She states that 3 days after this appointment on 12/13/2020, she began to experience pain and swelling in her left third toe.  She states that at that time her granddaughter noticed that she had a blister on the top of her third left toe.  Patient reports that she has baseline dysfunction of her left foot after suffering a stroke.  She reports that her left foot toes are unable to extend and due to her residual effects of her stroke.  She reports that the swelling has since gone down some but she still has pain with extension of the left third toe.  She reports having some sensation is still in her left toe.  Patient denies any pain in her foot.  She states that she is able to stand and does not experience pain.  She denies any fevers or chills.  She denies any paresthesias but does state that sometimes her foot feels like it is burning. Pain is improved with taking Tylenol.  PERTINENT  PMH / PSH:  DM HFpEF Wheel chair usage for mobility   OBJECTIVE:   BP (!) 142/70    Pulse 64    SpO2 99%   General: elderly female sitting in electric wheelchair, NAD  Foot: Inspection: Left foot with contracture and plantar flexed toes.  No swelling, erythema, or bruising.  Palpation: + tenderness to palpation ROM: Full  ROM of the ankle. Normal midfoot flexibility Neurovasc: faint dorsalis pedis and posterior tibialis pulses    Media Information         Document Information  Photos    01/03/2021 11:30  Attached To:  Office Visit on 01/03/21 with Eulis Foster, MD   Source Information  Eulis Foster, MD   [facility]    Media  Information         Document Information  Photos    01/03/2021 11:29  Attached To:  Office Visit on 01/03/21 with Eulis Foster, MD   Source Information  [name], [name], MD   [facility] Resident      ASSESSMENT/PLAN:   Toe pain, left Patient told/fluid does not appear to be infected at this time.  Do not see any erythema or streaking in patient's foot nor lower leg.  There is no warmth to touch.  Patient does demonstrate sensation in [place] and dorsal aspect of foot.  Do not appreciate any edema today.  Able to appreciate small amount of skin beneath the affected toe and an attempt to clean fluid, the skin may have been irritated while still alive and attached causing the pain.  Other toes are not affected nor tender with manipulation. -Patient advised to follow-up with podiatry and inform them of symptoms -We will order x-ray of left foot given tenderness for close to 1 month -Discussed with patient to return to care if she notices redness, swelling, worsening pain despite medication     Eulis Foster, MD [place]

## 2021-01-03 ENCOUNTER — Encounter: Payer: Self-pay | Admitting: Family Medicine

## 2021-01-03 ENCOUNTER — Ambulatory Visit: Payer: Medicare PPO | Admitting: Family Medicine

## 2021-01-03 ENCOUNTER — Other Ambulatory Visit: Payer: Self-pay

## 2021-01-03 VITALS — BP 142/70 | HR 64

## 2021-01-03 DIAGNOSIS — M79672 Pain in left foot: Secondary | ICD-10-CM | POA: Diagnosis not present

## 2021-01-03 DIAGNOSIS — M79675 Pain in left toe(s): Secondary | ICD-10-CM | POA: Insufficient documentation

## 2021-01-03 MED ORDER — CLOTRIMAZOLE 1 % EX CREA: 1.0000 "application " | TOPICAL_CREAM | Freq: Two times a day (BID) | CUTANEOUS | 0 refills | Status: DC

## 2021-01-03 NOTE — Assessment & Plan Note (Signed)
Patient told/fluid does not appear to be infected at this time.  Do not see any erythema or streaking in patient's foot nor lower leg.  There is no warmth to touch.  Patient does demonstrate sensation in [place] and dorsal aspect of foot.  Do not appreciate any edema today.  Able to appreciate small amount of skin beneath the affected toe and an attempt to clean fluid, the skin may have been irritated while still alive and attached causing the pain.  Other toes are not affected nor tender with manipulation. -Patient advised to follow-up with podiatry and inform them of symptoms -We will order x-ray of left foot given tenderness for close to 1 month -Discussed with patient to return to care if she notices redness, swelling, worsening pain despite medication

## 2021-01-03 NOTE — Addendum Note (Signed)
Addended by: Adolph Pollack on: 01/03/2021 02:19 PM   Modules accepted: Orders

## 2021-01-06 ENCOUNTER — Ambulatory Visit (INDEPENDENT_AMBULATORY_CARE_PROVIDER_SITE_OTHER): Payer: Medicare PPO

## 2021-01-06 DIAGNOSIS — I639 Cerebral infarction, unspecified: Secondary | ICD-10-CM | POA: Diagnosis not present

## 2021-01-06 LAB — CUP PACEART REMOTE DEVICE CHECK
Battery Remaining Longevity: 135 mo
Battery Voltage: 3.08 V
Brady Statistic AP VP Percent: 51.85 %
Brady Statistic AP VS Percent: 4.64 %
Brady Statistic AS VP Percent: 35.46 %
Brady Statistic AS VS Percent: 8.05 %
Brady Statistic RA Percent Paced: 56.74 %
Brady Statistic RV Percent Paced: 87.31 %
Date Time Interrogation Session: 20220113011524
Implantable Lead Implant Date: 20210702
Implantable Lead Implant Date: 20210702
Implantable Lead Location: 753859
Implantable Lead Location: 753860
Implantable Lead Model: 5076
Implantable Lead Model: 5076
Implantable Pulse Generator Implant Date: 20210702
Lead Channel Impedance Value: 247 Ohm
Lead Channel Impedance Value: 361 Ohm
Lead Channel Impedance Value: 418 Ohm
Lead Channel Impedance Value: 437 Ohm
Lead Channel Pacing Threshold Amplitude: 0.5 V
Lead Channel Pacing Threshold Amplitude: 0.875 V
Lead Channel Pacing Threshold Pulse Width: 0.4 ms
Lead Channel Pacing Threshold Pulse Width: 0.4 ms
Lead Channel Sensing Intrinsic Amplitude: 3.125 mV
Lead Channel Sensing Intrinsic Amplitude: 3.125 mV
Lead Channel Sensing Intrinsic Amplitude: 9.125 mV
Lead Channel Sensing Intrinsic Amplitude: 9.125 mV
Lead Channel Setting Pacing Amplitude: 1.75 V
Lead Channel Setting Pacing Amplitude: 2.5 V
Lead Channel Setting Pacing Pulse Width: 0.4 ms
Lead Channel Setting Sensing Sensitivity: 1.2 mV

## 2021-01-07 ENCOUNTER — Other Ambulatory Visit: Payer: Self-pay

## 2021-01-07 MED ORDER — GABAPENTIN 300 MG PO CAPS: 300.0000 mg | ORAL_CAPSULE | Freq: Three times a day (TID) | ORAL | 2 refills | Status: DC

## 2021-01-09 DIAGNOSIS — G4733 Obstructive sleep apnea (adult) (pediatric): Secondary | ICD-10-CM | POA: Diagnosis not present

## 2021-01-14 DIAGNOSIS — E1065 Type 1 diabetes mellitus with hyperglycemia: Secondary | ICD-10-CM | POA: Diagnosis not present

## 2021-01-16 ENCOUNTER — Encounter: Payer: Self-pay | Admitting: Family Medicine

## 2021-01-17 ENCOUNTER — Other Ambulatory Visit: Payer: Self-pay | Admitting: *Deleted

## 2021-01-17 MED ORDER — TORSEMIDE 10 MG PO TABS: ORAL_TABLET | ORAL | 0 refills | Status: DC

## 2021-01-17 MED ORDER — LOSARTAN POTASSIUM 100 MG PO TABS: 100.0000 mg | ORAL_TABLET | Freq: Every day | ORAL | Status: DC

## 2021-01-18 DIAGNOSIS — E1065 Type 1 diabetes mellitus with hyperglycemia: Secondary | ICD-10-CM | POA: Diagnosis not present

## 2021-01-20 ENCOUNTER — Other Ambulatory Visit: Payer: Self-pay | Admitting: Family Medicine

## 2021-01-20 ENCOUNTER — Other Ambulatory Visit: Payer: Self-pay | Admitting: Cardiology

## 2021-01-20 DIAGNOSIS — E1059 Type 1 diabetes mellitus with other circulatory complications: Secondary | ICD-10-CM | POA: Diagnosis not present

## 2021-01-20 DIAGNOSIS — E1159 Type 2 diabetes mellitus with other circulatory complications: Secondary | ICD-10-CM | POA: Diagnosis not present

## 2021-01-20 DIAGNOSIS — I480 Paroxysmal atrial fibrillation: Secondary | ICD-10-CM | POA: Diagnosis not present

## 2021-01-20 DIAGNOSIS — I10 Essential (primary) hypertension: Secondary | ICD-10-CM | POA: Diagnosis not present

## 2021-01-20 DIAGNOSIS — I5030 Unspecified diastolic (congestive) heart failure: Secondary | ICD-10-CM | POA: Diagnosis not present

## 2021-01-20 MED ORDER — LOSARTAN POTASSIUM 100 MG PO TABS: 100.0000 mg | ORAL_TABLET | Freq: Every day | ORAL | Status: DC

## 2021-01-20 MED ORDER — INSULIN ASPART 100 UNIT/ML ~~LOC~~ SOLN: 20.0000 [IU] | SUBCUTANEOUS | Status: DC

## 2021-01-20 MED ORDER — TORSEMIDE 10 MG PO TABS: ORAL_TABLET | ORAL | 0 refills | Status: DC

## 2021-01-20 NOTE — Progress Notes (Signed)
Remote pacemaker transmission.   

## 2021-01-25 DIAGNOSIS — R296 Repeated falls: Secondary | ICD-10-CM | POA: Diagnosis not present

## 2021-01-28 ENCOUNTER — Ambulatory Visit: Payer: Medicare PPO | Admitting: Licensed Clinical Social Worker

## 2021-01-28 ENCOUNTER — Telehealth: Payer: Self-pay | Admitting: Licensed Clinical Social Worker

## 2021-01-28 DIAGNOSIS — Z7189 Other specified counseling: Secondary | ICD-10-CM

## 2021-01-28 NOTE — Chronic Care Management (AMB) (Signed)
    Clinical Social Work  Care Management  Unsuccessful Phone Outreach    01/28/2021 Name: Elaina Cara MRN: [id number] DOB: 10-01-1951  [name] Taqwa Deem is a 70 y.o. year old female who is a primary care patient of [name], [name], MD .   F/U phone call today to assess needs, and progress with care plan goals.  Telephone outreach was unsuccessful  Plan:LCSW will wait for return call.  Review of patient status, including review of consultants reports, relevant laboratory and other test results, and collaboration with appropriate care team members and the patient's provider was performed as part of comprehensive patient evaluation and provision of care management services.    Casimer Lanius, [place] / [place]   [phone] 3:42 PM

## 2021-01-31 ENCOUNTER — Other Ambulatory Visit: Payer: Self-pay | Admitting: Family Medicine

## 2021-02-01 NOTE — Chronic Care Management (AMB) (Signed)
Care Management Clinical Social Work Note  02/01/2021 Name: [name] MRN: [id number] DOB: [birth year]  [name] is a 70 y.o. year old female who is a primary care patient of [name], [name], MD.  The Care Management team was consulted for assistance with chronic disease management and coordination needs.  Engaged with patient by telephone for follow up visit in response to provider referral for social work chronic care management and care coordination services  Consent to Services:  Patient agreed to services and consent obtained.   Assessment: Patient continues to experience difficulty with getting home modifications.. Patient is making progress with managing symptoms of depression  . She continues to her therapist monthly, however reports her sessions will be ending soon. See Care Plan below for interventions and patient self-care actives. Recommendation: Patient may benefit from, and is in agreement to continue to explore options for home modification.  Follow up Plan: Patient would like continued follow-up.  CCM LCSW  will f/u with patient in 60 days for ongoing needs.. Patient will call office if needed prior to next encounter    Review of patient past medical history, allergies, medications, and health status, including review of relevant consultants reports was performed today as part of a comprehive evaluation and provision of chronic care management and care coordination services.  SDOH (Social Determinants of Health) assessments and interventions performed:    Advanced Directives Status: Not addressed in this encounter.  Care Plan  Allergies  Allergen Reactions  . Latex Rash    Outpatient Encounter Medications as of 01/28/2021  Medication Sig  . ACCU-CHEK GUIDE test strip   . acetaminophen (TYLENOL) 500 MG tablet Take 1,000 mg by mouth every 8 (eight) hours as needed for mild pain.   Marland Kitchen amLODipine (NORVASC) 5 MG tablet Take 1 tablet (5  mg total) by mouth daily.  Marland Kitchen apixaban (ELIQUIS) 5 MG TABS tablet Take 1 tablet (5 mg total) by mouth 2 (two) times daily.  Marland Kitchen atorvastatin (LIPITOR) 80 MG tablet Take 1 tablet (80 mg total) by mouth every evening.  . clotrimazole (LOTRIMIN) 1 % cream Apply 1 application topically 2 (two) times daily. To affect area of foot  . Continuous Blood Gluc Transmit (DEXCOM G6 TRANSMITTER) MISC by Does not apply route. Checks her blood sugar  . CRANBERRY EXTRACT PO Take 1 capsule by mouth daily.  . fexofenadine (ALLEGRA) 180 MG tablet Take 180 mg by mouth daily as needed for allergies or rhinitis.  Marland Kitchen FLUoxetine (PROZAC) 10 MG capsule Take 1 capsule (10 mg total) by mouth daily. Take with the 20 mg dose to equal 30 mg daily  . FLUoxetine (PROZAC) 20 MG tablet TAKE 1 TABLET BY MOUTH EVERY DAY ALONG WITH 10 MG FOR A TOTAL DOSE OF 30 MG EVERY DAY  . gabapentin (NEURONTIN) 300 MG capsule Take 1 capsule (300 mg total) by mouth 3 (three) times daily.  . insulin aspart (NOVOLOG) 100 UNIT/ML injection Inject 20-30 Units into the skin daily. To be used in patient's insulin pump  . Insulin Human (INSULIN PUMP) SOLN Inject 1 each into the skin continuous. Novolog insulin  . levothyroxine (SYNTHROID) 175 MCG tablet Take 1 tablet (175 mcg total) by mouth daily before breakfast.  . loperamide (IMODIUM A-D) 2 MG tablet Take 1 tablet (2 mg total) by mouth 4 (four) times daily as needed for diarrhea or loose stools.  Marland Kitchen losartan (COZAAR) 100 MG tablet Take 1 tablet (100 mg total) by mouth daily.  . Multiple  Vitamins-Minerals (OCUVITE PO) Take 1 tablet by mouth daily.  . ondansetron (ZOFRAN-ODT) 4 MG disintegrating tablet Take 4 mg by mouth every 8 (eight) hours as needed for nausea or vomiting.  . pantoprazole (PROTONIX) 40 MG tablet Take 1 tablet (40 mg total) by mouth 2 (two) times daily.  . potassium chloride (KLOR-CON) 10 MEQ tablet Take 1 tablet (10 mEq total) by mouth daily.  Marland Kitchen torsemide (DEMADEX) 10 MG tablet TAKE 1  TABLET(10 MG) BY MOUTH DAILY AS NEEDED   No facility-administered encounter medications on file as of 01/28/2021.    Patient Active Problem List   Diagnosis Date Noted  . Toe pain, left 01/03/2021  . Mild cognitive impairment 09/03/2020  . Presbyopia 09/03/2020  . Counseling regarding advanced directives 09/03/2020  . Constipation 09/03/2020  . Imbalance 09/03/2020  . Decreased range of motion of left shoulder 09/03/2020  . Chronic lower back pain 07/27/2020  . S/P placement of cardiac pacemaker 06/25/20  06/26/2020  . Second degree AV block 06/25/2020  . Chronic anticoagulation 03/08/2020  . Hemiparesis and numbness affecting left side as late effect of cerebrovascular accident (CVA) ([place]) 03/08/2020  . History of Falls with injury 03/08/2020  . Hypoalbuminemia 03/08/2020  . Secondary hypercoagulable state ([place]) 11/12/2019  . Dependent for wheelchair mobility 08/05/2019  . OSA (obstructive sleep apnea) 05/27/2019  . Paroxysmal atrial fibrillation ([place]) 05/27/2019  . Insulin pump in place   . (HFpEF) heart failure with preserved ejection fraction ([place]) 03/17/2019  . Mood disorder ([place]) 10/29/2018  . Hypertension associated with diabetes ([place]) 10/29/2015  . 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 Lower extremity edema 08/20/2013  . Morbid obesity ([place]) 05/29/2013  . 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 Hypothyroidism, 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  . GERD (gastroesophageal reflux disease) 01/25/2012    Conditions to be addressed/monitored:Level of care for home modifications; depression   Care Plan : Social Work  Updates made by [name] Cane, LCSW since 02/01/2021 12:00 AM  Problem: Functional Decline   Long-Range Goal: patient would like home modification   Start Date: 10/08/2020  Expected End Date: 08/08/2021  This Visit's Progress: Not on track  Recent Progress: On track  Note:   Current Barriers:   . Does not qualify for [facility] Program and does not have the money for home modifications . Patient has completed and return Aging Systems analyst. Reports she returned it late and is hopeful that they will still consider her for the program. . Patient in wheelchair needs community resources to assist with home modifications  . Patient acknowledges deficits and needs support, education and care coordination in order to meet this unmet need  . Patient unable to afford modifications Interventions provided by LCSW:  . Assessment of needs and barriers to care as well as how impacting   . Received notice from [facility] that patient does not qualify for program;  . E-mail sent to aging gracefully coordinator Sharol Given asking for other options for patient    Patient Self Care Activities:  . I will f/u with [facility] to see there are other options for you     Casimer Lanius, [place] / [place]   [phone] 8:53 AM

## 2021-02-01 NOTE — Patient Instructions (Signed)
Visit Information  Goals Addressed            This Visit's Progress   . Advance Directives   Not on track    Patient Self Care Activities:  . will review advance directive mailed by LCSW . Get family to assist with bringing in a notary     . Home modification with [facility]   Not on track    Patient Self Care Activities:  . I will f/u with [facility] to see if they have other options for you      Patient verbalizes understanding of instructions provided today and agrees to view in [place].   Telephone follow up appointment with care management team member scheduled for: 04/01/2021  Maurine Cane, LCSW

## 2021-02-03 ENCOUNTER — Telehealth: Payer: Medicare PPO

## 2021-02-04 DIAGNOSIS — E1065 Type 1 diabetes mellitus with hyperglycemia: Secondary | ICD-10-CM | POA: Diagnosis not present

## 2021-02-14 NOTE — Progress Notes (Signed)
    SUBJECTIVE:   CHIEF COMPLAINT / HPI:  HTN   Hypertension Patient presents for follow-up of elevated blood pressure.  Patient states that about 2 weeks ago, her home health agent noted that her blood pressure was elevated at 299 systolic.  Patient denied any symptoms at that time.  She denies any current symptoms.  Since then, patient has been taking 10 mg of amlodipine as well as losartan 100 mg daily.  She denies any lightheadedness or dizziness since the changes in her medication.  Today, she has no chest pain, no headache, no vision changes.  Patient states that she feels well.  DM [name] with endocrinology with [facility]. Her A1c was checked at it was 9.1 about 2 weeks ago. Patient wears G6 continuous glucometer.  Patient reports her goal was between 23 and 180 for her blood glucoses.   Healthcare Maintenance  Patient needs eye exam & mammogram. She is agreeable to ordering mammogram.    PERTINENT  PMH / PSH:  DM  HTN  HFpEF AF OSA  Wheel chair dependent   OBJECTIVE:   BP 132/60   Pulse 65   Ht 5\' 7"  (1.702 m)   Wt 230 lb (104.3 kg)   SpO2 98%   BMI 36.02 kg/m   General: Elderly female appearing stated age in no acute distress,  HEENT: MMM, no oral lesions noted,Neck non-tender without lymphadenopathy Cardio: Normal S1 and S2, no S3 or S4. Rhythm is regular. No murmurs or rubs.  Bilateral radial pulses palpable Pulm: Clear to auscultation bilaterally, no crackles, wheezing, or diminished breath sounds. Normal respiratory effort Abdomen: Bowel sounds normal. Abdomen soft and non-tender.  Extremities: No peripheral edema. Warm & well perfused.  Neuro: pt alert and oriented x4, follows commands, PERRLA, EOMI bilaterally   ASSESSMENT/PLAN:   Hypertension associated with diabetes ([place]) BP within normal limits in office. No HA, chest pain, SOB. Blurry vision.  Continue amlodipine 10mg  daily  BMP scheduled for March   Type 1 diabetes mellitus with circulatory  complication ([place]) Managed by [name], endocrinology  A1c measured 2 weeks ago    Healthcare maintenance Mammogram ordered      Eulis Foster, MD [place]

## 2021-02-15 ENCOUNTER — Ambulatory Visit (INDEPENDENT_AMBULATORY_CARE_PROVIDER_SITE_OTHER): Payer: Medicare PPO | Admitting: Family Medicine

## 2021-02-15 ENCOUNTER — Other Ambulatory Visit: Payer: Self-pay

## 2021-02-15 ENCOUNTER — Ambulatory Visit: Payer: Medicare PPO

## 2021-02-15 VITALS — BP 132/60 | HR 65 | Ht 67.0 in | Wt 230.0 lb

## 2021-02-15 DIAGNOSIS — Z1231 Encounter for screening mammogram for malignant neoplasm of breast: Secondary | ICD-10-CM

## 2021-02-15 DIAGNOSIS — Z0000 Encounter for general adult medical examination without abnormal findings: Secondary | ICD-10-CM | POA: Diagnosis not present

## 2021-02-15 DIAGNOSIS — I152 Hypertension secondary to endocrine disorders: Secondary | ICD-10-CM

## 2021-02-15 DIAGNOSIS — E1159 Type 2 diabetes mellitus with other circulatory complications: Secondary | ICD-10-CM | POA: Diagnosis not present

## 2021-02-15 DIAGNOSIS — E1059 Type 1 diabetes mellitus with other circulatory complications: Secondary | ICD-10-CM

## 2021-02-15 MED ORDER — AMLODIPINE BESYLATE 10 MG PO TABS: 10.0000 mg | ORAL_TABLET | Freq: Every day | ORAL | 1 refills | Status: DC

## 2021-02-15 NOTE — Assessment & Plan Note (Signed)
BP within normal limits in office. No HA, chest pain, SOB. Blurry vision.  Continue amlodipine 10mg  daily  BMP scheduled for March

## 2021-02-15 NOTE — Patient Instructions (Signed)
Your blood pressure is within normal limits. Please continue your 10mg  of amlodipine and 100mg  of Losartan.   Please follow-up with me in 2 months or earlier if needed.  I have also ordered a mammogram for you to complete.  Please call the number listed on the information given to schedule this.  Please notify us if you begin to have lightheadedness or dizziness or notice that your blood pressure is less than 100/80

## 2021-02-16 DIAGNOSIS — Z794 Long term (current) use of insulin: Secondary | ICD-10-CM | POA: Diagnosis not present

## 2021-02-16 DIAGNOSIS — I639 Cerebral infarction, unspecified: Secondary | ICD-10-CM | POA: Diagnosis not present

## 2021-02-16 DIAGNOSIS — E890 Postprocedural hypothyroidism: Secondary | ICD-10-CM | POA: Diagnosis not present

## 2021-02-16 DIAGNOSIS — E7800 Pure hypercholesterolemia, unspecified: Secondary | ICD-10-CM | POA: Diagnosis not present

## 2021-02-16 DIAGNOSIS — I10 Essential (primary) hypertension: Secondary | ICD-10-CM | POA: Diagnosis not present

## 2021-02-16 DIAGNOSIS — Z9641 Presence of insulin pump (external) (internal): Secondary | ICD-10-CM | POA: Diagnosis not present

## 2021-02-16 DIAGNOSIS — E11319 Type 2 diabetes mellitus with unspecified diabetic retinopathy without macular edema: Secondary | ICD-10-CM | POA: Diagnosis not present

## 2021-02-16 DIAGNOSIS — C73 Malignant neoplasm of thyroid gland: Secondary | ICD-10-CM | POA: Diagnosis not present

## 2021-02-16 DIAGNOSIS — E109 Type 1 diabetes mellitus without complications: Secondary | ICD-10-CM | POA: Diagnosis not present

## 2021-02-17 DIAGNOSIS — Z0000 Encounter for general adult medical examination without abnormal findings: Secondary | ICD-10-CM | POA: Insufficient documentation

## 2021-02-17 NOTE — Assessment & Plan Note (Signed)
Managed by [name], endocrinology  A1c measured 2 weeks ago

## 2021-02-17 NOTE — Assessment & Plan Note (Signed)
Mammogram ordered

## 2021-02-22 DIAGNOSIS — R296 Repeated falls: Secondary | ICD-10-CM | POA: Diagnosis not present

## 2021-02-24 ENCOUNTER — Other Ambulatory Visit: Payer: Self-pay

## 2021-02-24 MED ORDER — ATORVASTATIN CALCIUM 80 MG PO TABS: 80.0000 mg | ORAL_TABLET | Freq: Every evening | ORAL | 2 refills | Status: DC

## 2021-03-11 ENCOUNTER — Other Ambulatory Visit: Payer: Self-pay

## 2021-03-11 MED ORDER — LOSARTAN POTASSIUM 100 MG PO TABS
100.0000 mg | ORAL_TABLET | Freq: Every day | ORAL | Status: DC
Start: 2021-03-11 — End: 2021-03-13

## 2021-03-13 ENCOUNTER — Other Ambulatory Visit: Payer: Self-pay | Admitting: Family Medicine

## 2021-03-14 MED ORDER — LOSARTAN POTASSIUM 100 MG PO TABS: 100.0000 mg | ORAL_TABLET | Freq: Every day | ORAL | Status: DC

## 2021-03-15 NOTE — Telephone Encounter (Signed)
Patient calls nurse line regarding losartan rx. Per chart review, previous rx was set to "no print". Also, I am unable to find the dispense quantity or number of refills on the last rx. Please send new rx to [facility] on [address].   Talbot Grumbling, RN

## 2021-03-16 MED ORDER — LOSARTAN POTASSIUM 100 MG PO TABS: 100.0000 mg | ORAL_TABLET | Freq: Every day | ORAL | 1 refills | Status: DC

## 2021-03-16 NOTE — Telephone Encounter (Signed)
Rx for losartan re-sent to pharmacy.

## 2021-03-16 NOTE — Addendum Note (Signed)
Addended by: Adolph Pollack on: 03/16/2021 04:35 PM   Modules accepted: Orders

## 2021-03-18 ENCOUNTER — Encounter: Payer: Self-pay | Admitting: Podiatry

## 2021-03-18 ENCOUNTER — Ambulatory Visit: Payer: Medicare PPO | Admitting: Podiatry

## 2021-03-18 ENCOUNTER — Other Ambulatory Visit: Payer: Self-pay

## 2021-03-18 DIAGNOSIS — M79675 Pain in left toe(s): Secondary | ICD-10-CM | POA: Diagnosis not present

## 2021-03-18 DIAGNOSIS — M79674 Pain in right toe(s): Secondary | ICD-10-CM | POA: Diagnosis not present

## 2021-03-18 DIAGNOSIS — E1159 Type 2 diabetes mellitus with other circulatory complications: Secondary | ICD-10-CM

## 2021-03-18 DIAGNOSIS — D689 Coagulation defect, unspecified: Secondary | ICD-10-CM | POA: Diagnosis not present

## 2021-03-18 DIAGNOSIS — B351 Tinea unguium: Secondary | ICD-10-CM | POA: Diagnosis not present

## 2021-03-18 NOTE — Progress Notes (Signed)
This patient returns to my office for at risk foot care.  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  This patient is unable to cut nails herself  since the patient cannot reach her  nails.These nails are painful walking and wearing shoes.  Patient presents in a wheelchair .  This patient presents for at risk foot care today.  General Appearance  Alert, conversant and in no acute stress.  Vascular  Dorsalis pedis and posterior tibial  pulses are weakly  palpable  bilaterally.  Capillary return is within normal limits  bilaterally. Temperature is within normal limits  bilaterally.  Neurologic  Senn-Weinstein monofilament wire diminished  bilaterally. Muscle power within normal limits bilaterally.  Nails Thick disfigured discolored nails with subungual debris  from hallux to fifth toes bilaterally. No evidence of bacterial infection or drainage bilaterally.  Orthopedic  No limitations of motion  feet .  No crepitus or effusions noted.  No bony pathology or digital deformities noted.  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  Skin  normotropic skin with no porokeratosis noted bilaterally.  No signs of infections or ulcers noted.     Onychomycosis  Pain in right toes  Pain in left toes  Consent was obtained for treatment procedures.   Mechanical debridement of nails 1-5  bilaterally performed with a nail nipper.  Filed with dremel without incident. No infection or ulcer.     Return office visit    3 months       Told patient to return for periodic foot care and evaluation due to potential at risk complications.   Gardiner Barefoot DPM

## 2021-03-25 DIAGNOSIS — R296 Repeated falls: Secondary | ICD-10-CM | POA: Diagnosis not present

## 2021-04-01 ENCOUNTER — Telehealth: Payer: Medicare PPO

## 2021-04-01 ENCOUNTER — Telehealth: Payer: Self-pay | Admitting: Licensed Clinical Social Worker

## 2021-04-01 ENCOUNTER — Other Ambulatory Visit: Payer: Self-pay

## 2021-04-01 NOTE — Telephone Encounter (Signed)
    Clinical Social Work  Care Management  Unsuccessful Phone Outreach    04/01/2021 Name: Amorette Charrette MRN: [id number] DOB: 12-22-1951  [name] Shavonte Zhao is a 70 y.o. year old female who is a primary care patient of [name], [name], MD .   F/U phone call today to assess needs, and progress with care plan goals.  Telephone outreach was unsuccessful A HIPPA compliant phone message was left for the patient providing contact information and requesting a return call.   Plan:LCSW will wait for return call. If no return call is received, LCSW will follow up in 30 days  Review of patient status, including review of consultants reports, relevant laboratory and other test results, and collaboration with appropriate care team members and the patient's provider was performed as part of comprehensive patient evaluation and provision of care management services.      Casimer Lanius, [place] / [place]   [phone] 2:01 PM

## 2021-04-02 ENCOUNTER — Encounter: Payer: Self-pay | Admitting: Family Medicine

## 2021-04-04 DIAGNOSIS — E1065 Type 1 diabetes mellitus with hyperglycemia: Secondary | ICD-10-CM | POA: Diagnosis not present

## 2021-04-06 ENCOUNTER — Other Ambulatory Visit: Payer: Self-pay

## 2021-04-06 ENCOUNTER — Ambulatory Visit
Admission: RE | Admit: 2021-04-06 | Discharge: 2021-04-06 | Disposition: A | Discharge status: Home / Self Care | Payer: Medicare PPO | Source: Ambulatory Visit | Attending: Family Medicine | Admitting: Family Medicine

## 2021-04-06 DIAGNOSIS — Z1231 Encounter for screening mammogram for malignant neoplasm of breast: Secondary | ICD-10-CM

## 2021-04-07 ENCOUNTER — Ambulatory Visit (INDEPENDENT_AMBULATORY_CARE_PROVIDER_SITE_OTHER): Payer: Medicare PPO

## 2021-04-07 DIAGNOSIS — I441 Atrioventricular block, second degree: Secondary | ICD-10-CM | POA: Diagnosis not present

## 2021-04-07 LAB — CUP PACEART REMOTE DEVICE CHECK
Battery Remaining Longevity: 129 mo
Battery Voltage: 3.04 V
Brady Statistic AP VP Percent: 57.85 %
Brady Statistic AP VS Percent: 0.68 %
Brady Statistic AS VP Percent: 39.61 %
Brady Statistic AS VS Percent: 1.86 %
Brady Statistic RA Percent Paced: 58.64 %
Brady Statistic RV Percent Paced: 97.46 %
Date Time Interrogation Session: 20220414022151
Implantable Lead Implant Date: 20210702
Implantable Lead Implant Date: 20210702
Implantable Lead Location: 753859
Implantable Lead Location: 753860
Implantable Lead Model: 5076
Implantable Lead Model: 5076
Implantable Pulse Generator Implant Date: 20210702
Lead Channel Impedance Value: 266 Ohm
Lead Channel Impedance Value: 361 Ohm
Lead Channel Impedance Value: 437 Ohm
Lead Channel Impedance Value: 494 Ohm
Lead Channel Pacing Threshold Amplitude: 0.5 V
Lead Channel Pacing Threshold Amplitude: 0.875 V
Lead Channel Pacing Threshold Pulse Width: 0.4 ms
Lead Channel Pacing Threshold Pulse Width: 0.4 ms
Lead Channel Sensing Intrinsic Amplitude: 11 mV
Lead Channel Sensing Intrinsic Amplitude: 11 mV
Lead Channel Sensing Intrinsic Amplitude: 4.125 mV
Lead Channel Sensing Intrinsic Amplitude: 4.125 mV
Lead Channel Setting Pacing Amplitude: 1.75 V
Lead Channel Setting Pacing Amplitude: 2.5 V
Lead Channel Setting Pacing Pulse Width: 0.4 ms
Lead Channel Setting Sensing Sensitivity: 1.2 mV

## 2021-04-08 ENCOUNTER — Encounter: Payer: Self-pay | Admitting: Family Medicine

## 2021-04-08 DIAGNOSIS — G4733 Obstructive sleep apnea (adult) (pediatric): Secondary | ICD-10-CM | POA: Diagnosis not present

## 2021-04-12 NOTE — Patient Instructions (Incomplete)
It was a pleasure to see you today!  Thank you for choosing [facility] for your primary care.   [name] Mikhaela Zaugg was seen for HTN follow up.   Our plans for today were:  HTN **  **  To keep you healthy, please keep in mind the following health maintenance items that you are due for:   1. Hemoglobin A1c  2. Diabetes Eye Exam  3. COVID vaccination  4. Osteoporosis Screening    You should return to our clinic in ** for **.   Best Wishes,   Dr. Alba Cory

## 2021-04-12 NOTE — Progress Notes (Deleted)
    SUBJECTIVE:   CHIEF COMPLAINT / HPI: BP f/u   HTN  Current regimen includes amlodipine 10mg  daily and losartan 100mg . Patient reports adherence to mediation regimen. bP in office ***   HM  Patient reports that she does not desire to have further mammogram. ***   She is recommended for diabetes eye exam. Needs ophthalmology appt*** Patient would like to discuss vitamin D levels***   HFpEF Last echo ***  Current regimen includes *** Patient reports ** *  T1DM  Patient follows with endocrinology  Currently has insulin pump with 20-30 units ***  Agreeable to A1c today ***  Lab Results  Component Value Date   HGBA1C 8.6 (H) 06/25/2020   MDD  Current regimen includes  PHQ9 today   GERD  Continues to take protonix daily    ***  PERTINENT  PMH / PSH:   OBJECTIVE:   There were no vitals taken for this visit.  General: female appearing stated age in no acute distress HEENT: MMM, no oral lesions noted,Neck non-tender without lymphadenopathy, masses or thyromegaly*** Cardio: Normal S1 and S2, no S3 or S4. Rhythm is regular***. No murmurs or rubs.  Bilateral radial pulses palpable Pulm: Clear to auscultation bilaterally, no crackles, wheezing, or diminished breath sounds. Normal respiratory effort, stable on *** Abdomen: Bowel sounds normal. Abdomen soft and non-tender. *** Extremities: No peripheral edema. Warm/ well perfused. *** Neuro: pt alert and oriented x4    ASSESSMENT/PLAN:   No problem-specific Assessment & Plan notes found for this encounter.     Eulis Foster, MD [place]   {    This will disappear when note is signed, click to select method of visit    :1}

## 2021-04-19 ENCOUNTER — Ambulatory Visit: Payer: Medicare PPO | Admitting: Family Medicine

## 2021-04-24 DIAGNOSIS — R296 Repeated falls: Secondary | ICD-10-CM | POA: Diagnosis not present

## 2021-04-25 DIAGNOSIS — E1065 Type 1 diabetes mellitus with hyperglycemia: Secondary | ICD-10-CM | POA: Diagnosis not present

## 2021-04-25 NOTE — Progress Notes (Signed)
Remote pacemaker transmission.   

## 2021-05-12 ENCOUNTER — Telehealth: Payer: Self-pay | Admitting: Licensed Clinical Social Worker

## 2021-05-12 NOTE — Chronic Care Management (AMB) (Addendum)
    Clinical Social Work  Care Management  3rd Unsuccessful Phone Outreach    05/12/2021 Name: [name] MRN: [id number] DOB: 1951-02-21  [name] is a 70 y.o. year old female who is a primary care patient of [name], [name], MD .   3rd unsuccessful telephone outreach was attempted today  F/U phone call  to assess needs, and progress with care plan goals.   A HIPPA compliant phone message was left for the patient providing contact information and requesting a return call.    The patient was referred to the case management team for assistance with care management and care coordination. The patient's primary care provider has been notified of our unsuccessful attempts to make or maintain contact with the patient. The care management team is pleased to engage with this patient at any time in the future should he/she be interested in assistance from the care management team.   Plan:CCM LCSW will wait for return call. If no return call is received, Will disconnect from care team in the next 30 days. .   Review of patient status, including review of consultants reports, relevant laboratory and other test results, and collaboration with appropriate care team members and the patient's provider was performed as part of comprehensive patient evaluation and provision of care management services.    Casimer Lanius, [place] / [place]   [phone] 2:29 PM   I agree with the above documentation. Patient is scheduled for upcoming virtual visit with me due to transportation issues.   Eulis Foster, MD  05/15/2021

## 2021-05-16 ENCOUNTER — Other Ambulatory Visit: Payer: Self-pay

## 2021-05-16 ENCOUNTER — Encounter: Payer: Self-pay | Admitting: Family Medicine

## 2021-05-16 ENCOUNTER — Telehealth (INDEPENDENT_AMBULATORY_CARE_PROVIDER_SITE_OTHER): Payer: Medicare PPO | Admitting: Family Medicine

## 2021-05-16 DIAGNOSIS — Z993 Dependence on wheelchair: Secondary | ICD-10-CM | POA: Diagnosis not present

## 2021-05-16 DIAGNOSIS — Z7409 Other reduced mobility: Secondary | ICD-10-CM

## 2021-05-16 DIAGNOSIS — M2560 Stiffness of unspecified joint, not elsewhere classified: Secondary | ICD-10-CM

## 2021-05-16 DIAGNOSIS — M1990 Unspecified osteoarthritis, unspecified site: Secondary | ICD-10-CM | POA: Diagnosis not present

## 2021-05-16 NOTE — Progress Notes (Signed)
[facility] Telemedicine Visit  Patient consented to have virtual visit and was identified by name and date of birth. Method of visit: Telephone  Encounter participants: Patient: [name] - located at home address  Provider: Eulis Foster - located at [facility]   Chief Complaint: stiffness after lying down   HPI:  Patient reports that after lying down overnight to rest she feels that she is very stiff and has a hard time getting up. She reports having some trouble with moving her arm as if it is frozen. She feels that it takes about 20-30 minutes to get moving in the morning. She reports that she has been taking her gabapentin three times per day and makes sure to take it at bed time. She reports that her back does not have pain but does have leg pain. She reports that she figured that she has arthritis. She reports that she does not feel weak. She denies presence of pain other than some left leg pain every so often. She reports having spider veins and some dry scaly skin but has not noticed any redness or joint swelling. This stiffness has been occurring on a nightly basis. She reports feeling ok during the day but after 3-4 hours of sleeping, she describes the stiffness in her joints and muscles. Stiffness has been present for close to one month. She reports that she used to have home health services. She used to go to PT twice weekly and had to pay $10 per session and she reports this is not affordable for her. She reports that she has limited help at home and has her sister visit as much as possible.    ROS: per HPI  Pertinent PMHx:    Exam:   Respiratory: speaking in clear sentences, no signs   Assessment/Plan:   Stiffness due to immobility Patient likely affected by arthritic changes causing AM stiffness and difficulty getting out of bed in the mornings taking 20-30 minutes to move well. Patient is wheelchair bound with hx of  hemiparesis on left side due to prior CVA also with hx of fall. - strong recommendation for home health PT given mobility issues  - HH ordered completed    Time spent during visit with patient: 11 minutes

## 2021-05-17 DIAGNOSIS — E109 Type 1 diabetes mellitus without complications: Secondary | ICD-10-CM | POA: Diagnosis not present

## 2021-05-17 DIAGNOSIS — Z9641 Presence of insulin pump (external) (internal): Secondary | ICD-10-CM | POA: Diagnosis not present

## 2021-05-18 DIAGNOSIS — M2560 Stiffness of unspecified joint, not elsewhere classified: Secondary | ICD-10-CM | POA: Insufficient documentation

## 2021-05-18 NOTE — Assessment & Plan Note (Signed)
Patient likely affected by arthritic changes causing AM stiffness and difficulty getting out of bed in the mornings taking 20-30 minutes to move well. Patient is wheelchair bound with hx of hemiparesis on left side due to prior CVA also with hx of fall. - strong recommendation for home health PT given mobility issues  - HH ordered completed

## 2021-05-25 DIAGNOSIS — R296 Repeated falls: Secondary | ICD-10-CM | POA: Diagnosis not present

## 2021-06-06 ENCOUNTER — Other Ambulatory Visit: Payer: Self-pay

## 2021-06-06 ENCOUNTER — Encounter: Payer: Self-pay | Admitting: Student in an Organized Health Care Education/Training Program

## 2021-06-06 ENCOUNTER — Ambulatory Visit: Payer: Medicare PPO | Admitting: Student in an Organized Health Care Education/Training Program

## 2021-06-06 VITALS — BP 134/72 | HR 79

## 2021-06-06 DIAGNOSIS — E890 Postprocedural hypothyroidism: Secondary | ICD-10-CM

## 2021-06-06 DIAGNOSIS — Z7409 Other reduced mobility: Secondary | ICD-10-CM

## 2021-06-06 DIAGNOSIS — E1069 Type 1 diabetes mellitus with other specified complication: Secondary | ICD-10-CM

## 2021-06-06 DIAGNOSIS — M2560 Stiffness of unspecified joint, not elsewhere classified: Secondary | ICD-10-CM | POA: Diagnosis not present

## 2021-06-06 DIAGNOSIS — E1059 Type 1 diabetes mellitus with other circulatory complications: Secondary | ICD-10-CM

## 2021-06-06 DIAGNOSIS — E782 Mixed hyperlipidemia: Secondary | ICD-10-CM

## 2021-06-06 LAB — POCT GLYCOSYLATED HEMOGLOBIN (HGB A1C): HbA1c, POC (controlled diabetic range): 10.2 % — AB (ref 0.0–7.0)

## 2021-06-06 MED ORDER — BACLOFEN 10 MG PO TABS: 5.0000 mg | ORAL_TABLET | Freq: Two times a day (BID) | ORAL | 0 refills | Status: DC | PRN

## 2021-06-06 NOTE — Assessment & Plan Note (Signed)
Provided patient with rehab exercises for back. Trial of baclofen prescribed. BMP today to monitor kidney function. Continue lifestyle changes to lose weight and increase activity level.

## 2021-06-06 NOTE — Progress Notes (Signed)
    SUBJECTIVE:   CHIEF COMPLAINT / HPI: back pain f/u  Neck concerns- left side neck is stiff when she wakes up in the morning and improves with activity. Has h/o cervical arthritis. Takes gabapentin and tylenol daily. Warm compress gives some relief. She also does neck exercises and notices decreased ROM to the right rotation.  Total Thyroidectomy 2006. Follows with endocrinology but has been a while since she was seen in person and has not had blood since last year. Endorses coughing but no difficulty with swallowing or swelling.  Lower back pain- gabapentin TID 300mg  with no relief. Difficulty getting out of bed. Pain in the tailbone area. Present for two days. No fall or injury. Uses walker for bathroom but spends most of her time in wheelchair. 2/17 had CVA and has equilibrium issues so afraid of standing and falling. Denies any skin sores. No fevers. She states that she does home exercises most days but no longer in formal PT.  Has urinary hesitancy chronically.   OBJECTIVE:   BP 134/72   Pulse 79   SpO2 96%   Physical Exam Constitutional:      Appearance: She is obese. She is not ill-appearing or toxic-appearing.  Neck:     Thyroid: No thyroid mass (no mass, no tenderness, surgical scar well healed).  Musculoskeletal:     Cervical back: No swelling, deformity, erythema, rigidity or tenderness. Pain with movement (pain with left rotation and side-bending) present.     Lumbar back: Tenderness (tenderness to midline palpation of lumbar spine and decrease lordosis) present. No swelling. Decreased range of motion. Negative right straight leg raise test and negative left straight leg raise test.  Skin:    General: Skin is warm and dry.     Capillary Refill: Capillary refill takes less than 2 seconds.  Neurological:     Mental Status: She is alert and oriented to person, place, and time. Mental status is at baseline.     Comments: Has remaining hemi-weakness but no new focal deficits  from baseline.  Psychiatric:        Mood and Affect: Mood normal.        Behavior: Behavior normal.    ASSESSMENT/PLAN:   Stiffness due to immobility Provided patient with rehab exercises for back. Trial of baclofen prescribed. BMP today to monitor kidney function. Continue lifestyle changes to lose weight and increase activity level.   Hypothyroidism, postsurgical TSH check today.  Neck exam showed no new growths or abnormalities Provided patient reassurance that her neck stiffness is unlikely related to her thyroid and would trial muscle relaxer for muscle stiffness     Richarda Osmond, [place]

## 2021-06-06 NOTE — Patient Instructions (Signed)
It was a pleasure to see you today!  To summarize our discussion for this visit: I'm sorry that you 've been having aching in your back and neck. I think you've been doing some of the best things you can so far with staying active and applying heat to your joints. We will also try a muscle relaxer and increase your stretching today. We checked an A1c, thyroid, cholesterol, and kidney function today  Some additional health maintenance measures we should update are: Health Maintenance Due  Topic Date Due   DEXA SCAN  12/29/2015   Zoster Vaccines- Shingrix (2 of 2) 06/06/2017   OPHTHALMOLOGY EXAM  12/28/2017   COVID-19 Vaccine (2 - Pfizer risk series) 07/19/2020     Call the clinic at [phone] if your symptoms worsen or you have any concerns.  Low Back Sprain or Strain Rehab Ask your health care provider which exercises are safe for you. Do exercises exactly as told by your health care provider and adjust them as directed. It is normal to feel mild stretching, pulling, tightness, or discomfort as you do these exercises. Stop right away if you feel sudden pain or your pain gets worse. Do not begin these exercises until told by your health care provider. Stretching and range-of-motion exercises These exercises warm up your muscles and joints and improve the movement and flexibility of your back. These exercises also help to relieve pain, numbness,and tingling. Lumbar rotation  Lie on your back on a firm surface and bend your knees. Straighten your arms out to your sides so each arm forms a 90-degree angle (right angle) with a side of your body. Slowly move (rotate) both of your knees to one side of your body until you feel a stretch in your lower back (lumbar). Try not to let your shoulders lift off the floor. Hold this position for __________ seconds. Tense your abdominal muscles and slowly move your knees back to the starting position. Repeat this exercise on the other side of your  body. Repeat __________ times. Complete this exercise __________ times a day. Single knee to chest  Lie on your back on a firm surface with both legs straight. Bend one of your knees. Use your hands to move your knee up toward your chest until you feel a gentle stretch in your lower back and buttock. Hold your leg in this position by holding on to the front of your knee. Keep your other leg as straight as possible. Hold this position for __________ seconds. Slowly return to the starting position. Repeat with your other leg. Repeat __________ times. Complete this exercise __________ times a day. Prone extension on elbows  Lie on your abdomen on a firm surface (prone position). Prop yourself up on your elbows. Use your arms to help lift your chest up until you feel a gentle stretch in your abdomen and your lower back. This will place some of your body weight on your elbows. If this is uncomfortable, try stacking pillows under your chest. Your hips should stay down, against the surface that you are lying on. Keep your hip and back muscles relaxed. Hold this position for __________ seconds. Slowly relax your upper body and return to the starting position. Repeat __________ times. Complete this exercise __________ times a day. Strengthening exercises These exercises build strength and endurance in your back. Endurance is theability to use your muscles for a long time, even after they get tired. Pelvic tilt This exercise strengthens the muscles that lie deep in the  abdomen. Lie on your back on a firm surface. Bend your knees and keep your feet flat on the floor. Tense your abdominal muscles. Tip your pelvis up toward the ceiling and flatten your lower back into the floor. To help with this exercise, you may place a small towel under your lower back and try to push your back into the towel. Hold this position for __________ seconds. Let your muscles relax completely before you repeat this  exercise. Repeat __________ times. Complete this exercise __________ times a day. Alternating arm and leg raises  Get on your hands and knees on a firm surface. If you are on a hard floor, you may want to use padding, such as an exercise mat, to cushion your knees. Line up your arms and legs. Your hands should be directly below your shoulders, and your knees should be directly below your hips. Lift your left leg behind you. At the same time, raise your right arm and straighten it in front of you. Do not lift your leg higher than your hip. Do not lift your arm higher than your shoulder. Keep your abdominal and back muscles tight. Keep your hips facing the ground. Do not arch your back. Keep your balance carefully, and do not hold your breath. Hold this position for __________ seconds. Slowly return to the starting position. Repeat with your right leg and your left arm. Repeat __________ times. Complete this exercise __________ times a day. Abdominal set with straight leg raise  Lie on your back on a firm surface. Bend one of your knees and keep your other leg straight. Tense your abdominal muscles and lift your straight leg up, 4-6 inches (10-15 cm) off the ground. Keep your abdominal muscles tight and hold this position for __________ seconds. Do not hold your breath. Do not arch your back. Keep it flat against the ground. Keep your abdominal muscles tense as you slowly lower your leg back to the starting position. Repeat with your other leg. Repeat __________ times. Complete this exercise __________ times a day. Single leg lower with bent knees Lie on your back on a firm surface. Tense your abdominal muscles and lift your feet off the floor, one foot at a time, so your knees and hips are bent in 90-degree angles (right angles). Your knees should be over your hips and your lower legs should be parallel to the floor. Keeping your abdominal muscles tense and your knee bent, slowly lower  one of your legs so your toe touches the ground. Lift your leg back up to return to the starting position. Do not hold your breath. Do not let your back arch. Keep your back flat against the ground. Repeat with your other leg. Repeat __________ times. Complete this exercise __________ times a day. Posture and body mechanics Good posture and healthy body mechanics can help to relieve stress in your body's tissues and joints. Body mechanics refers to the movements and positions of your body while you do your daily activities. Posture is part of body mechanics. Good posture means: Your spine is in its natural S-curve position (neutral). Your shoulders are pulled back slightly. Your head is not tipped forward. Follow these guidelines to improve your posture and body mechanics in youreveryday activities. Standing  When standing, keep your spine neutral and your feet about hip width apart. Keep a slight bend in your knees. Your ears, shoulders, and hips should line up. When you do a task in which you stand in one place for  a long time, place one foot up on a stable object that is 2-4 inches (5-10 cm) high, such as a footstool. This helps keep your spine neutral.  Sitting  When sitting, keep your spine neutral and keep your feet flat on the floor. Use a footrest, if necessary, and keep your thighs parallel to the floor. Avoid rounding your shoulders, and avoid tilting your head forward. When working at a desk or a computer, keep your desk at a height where your hands are slightly lower than your elbows. Slide your chair under your desk so you are close enough to maintain good posture. When working at a computer, place your monitor at a height where you are looking straight ahead and you do not have to tilt your head forward or downward to look at the screen.  Resting When lying down and resting, avoid positions that are most painful for you. If you have pain with activities such as sitting,  bending, stooping, or squatting, lie in a position in which your body does not bend very much. For example, avoid curling up on your side with your arms and knees near your chest (fetal position). If you have pain with activities such as standing for a long time or reaching with your arms, lie with your spine in a neutral position and bend your knees slightly. Try the following positions: Lying on your side with a pillow between your knees. Lying on your back with a pillow under your knees. Lifting  When lifting objects, keep your feet at least shoulder width apart and tighten your abdominal muscles. Bend your knees and hips and keep your spine neutral. It is important to lift using the strength of your legs, not your back. Do not lock your knees straight out. Always ask for help to lift heavy or awkward objects.  This information is not intended to replace advice given to you by your health care provider. Make sure you discuss any questions you have with your healthcare provider. Document Revised: 04/04/2019 Document Reviewed: 01/02/2019 Elsevier Patient Education  [employer].

## 2021-06-06 NOTE — Assessment & Plan Note (Addendum)
TSH check today.  Neck exam showed no new growths or abnormalities Provided patient reassurance that her neck stiffness is unlikely related to her thyroid and would trial muscle relaxer for muscle stiffness

## 2021-06-07 ENCOUNTER — Telehealth: Payer: Self-pay

## 2021-06-07 ENCOUNTER — Ambulatory Visit: Payer: Medicare PPO | Admitting: Licensed Clinical Social Worker

## 2021-06-07 ENCOUNTER — Encounter: Payer: Self-pay | Admitting: Family Medicine

## 2021-06-07 DIAGNOSIS — Z7189 Other specified counseling: Secondary | ICD-10-CM

## 2021-06-07 LAB — LIPID PANEL
Chol/HDL Ratio: 2.2 ratio (ref 0.0–4.4)
Cholesterol, Total: 112 mg/dL (ref 100–199)
HDL: 50 mg/dL (ref >39)
LDL Chol Calc (NIH): 49 mg/dL (ref 0–99)
Triglycerides: 58 mg/dL (ref 0–149)
VLDL Cholesterol Cal: 13 mg/dL (ref 5–40)

## 2021-06-07 LAB — BASIC METABOLIC PANEL
BUN/Creatinine Ratio: 19 (ref 12–28)
BUN: 17 mg/dL (ref 8–27)
CO2: 22 mmol/L (ref 20–29)
Calcium: 9.1 mg/dL (ref 8.7–10.3)
Chloride: 102 mmol/L (ref 96–106)
Creatinine, Ser: 0.91 mg/dL (ref 0.57–1.00)
Glucose: 149 mg/dL — ABNORMAL HIGH (ref 65–99)
Potassium: 4 mmol/L (ref 3.5–5.2)
Sodium: 139 mmol/L (ref 134–144)
eGFR: 68 mL/min/{1.73_m2} (ref >59)

## 2021-06-07 LAB — TSH: TSH: 5.11 u[IU]/mL — ABNORMAL HIGH (ref 0.450–4.500)

## 2021-06-07 NOTE — Chronic Care Management (AMB) (Signed)
Care Management   Clinical Social Work Note  06/07/2021 Name: [name] MRN: [id number] DOB: October 24, 1951  [name] is a 70 y.o. year old female who is a primary care patient of [name], [place], MD. The CCM team was consulted to assist the patient with chronic disease management and/or care coordination needs related to: Level of Care Concerns after discontinue of remote health services.  Engaged with patient by telephone for follow up visit in response to provider referral for social work chronic care management and care coordination services.   Consent to Services: Patient agreed to services and consent obtained.    Assessment: Patient is engaged in conversation, continues to maintain positive progress with care plan goals..Patient states she only received remote health one time. So she does not think she will miss the service. She would like [facility].  PCP placed order, however there are barriers with getting service.  See Care Plan below for interventions and patient self-care actives.  Follow up Plan: Patient would like continued follow-up.  CCM LCSW will assist with progression of HH order and follow up with patient to make sure services are in place.  Patient will call office if needed prior to next encounter.   Review of patient past medical history, allergies, medications, and health status, including review of relevant consultants reports was performed today as part of a comprehensive evaluation and provision of chronic care management and care coordination services.     SDOH (Social Determinants of Health) assessments and interventions performed:      Advanced Directives Status: Not addressed in this encounter.  CCM Care Plan  Allergies  Allergen Reactions   Latex Rash    Outpatient Encounter Medications as of 06/07/2021  Medication Sig   ACCU-CHEK GUIDE test strip    acetaminophen (TYLENOL) 500 MG tablet Take 1,000 mg by mouth every 8  (eight) hours as needed for mild pain.    amLODipine (NORVASC) 10 MG tablet Take 1 tablet (10 mg total) by mouth daily.   apixaban (ELIQUIS) 5 MG TABS tablet Take 1 tablet (5 mg total) by mouth 2 (two) times daily.   atorvastatin (LIPITOR) 80 MG tablet Take 1 tablet (80 mg total) by mouth every evening.   baclofen (LIORESAL) 10 MG tablet Take 0.5 tablets (5 mg total) by mouth 2 (two) times daily as needed for muscle spasms.   clotrimazole (LOTRIMIN) 1 % cream Apply 1 application topically 2 (two) times daily. To affect area of foot   Continuous Blood Gluc Transmit (DEXCOM G6 TRANSMITTER) MISC by Does not apply route. Checks her blood sugar   CRANBERRY EXTRACT PO Take 1 capsule by mouth daily.   fexofenadine (ALLEGRA) 180 MG tablet Take 180 mg by mouth daily as needed for allergies or rhinitis.   FLUoxetine (PROZAC) 10 MG capsule TAKE ONE CAPSULE BY MOUTH EVERY DAY WITH 20MG  DOSE   FLUoxetine (PROZAC) 20 MG tablet TAKE 1 TABLET BY MOUTH EVERY DAY ALONG WITH 10 MG FOR A TOTAL DOSE OF 30 MG EVERY DAY   gabapentin (NEURONTIN) 300 MG capsule Take 1 capsule (300 mg total) by mouth 3 (three) times daily.   insulin aspart (NOVOLOG) 100 UNIT/ML injection Inject 20-30 Units into the skin daily. To be used in patient's insulin pump   Insulin Human (INSULIN PUMP) SOLN Inject 1 each into the skin continuous. Novolog insulin   levothyroxine (SYNTHROID) 175 MCG tablet TAKE 1 TABLET(175 MCG) BY MOUTH DAILY BEFORE BREAKFAST   loperamide (IMODIUM A-D)  2 MG tablet Take 1 tablet (2 mg total) by mouth 4 (four) times daily as needed for diarrhea or loose stools.   losartan (COZAAR) 100 MG tablet Take 1 tablet (100 mg total) by mouth daily.   Multiple Vitamins-Minerals (OCUVITE PO) Take 1 tablet by mouth daily.   pantoprazole (PROTONIX) 40 MG tablet Take 1 tablet (40 mg total) by mouth 2 (two) times daily.   potassium chloride (KLOR-CON) 10 MEQ tablet Take 1 tablet (10 mEq total) by mouth daily.   torsemide (DEMADEX)  10 MG tablet TAKE 1 TABLET(10 MG) BY MOUTH DAILY AS NEEDED   No facility-administered encounter medications on file as of 06/07/2021.    Patient Active Problem List   Diagnosis Date Noted   Stiffness due to immobility 05/18/2021   Healthcare maintenance 02/17/2021   Mild cognitive impairment 09/03/2020   Presbyopia 09/03/2020   Counseling regarding advanced directives 09/03/2020   Constipation 09/03/2020   Imbalance 09/03/2020   Decreased range of motion of left shoulder 09/03/2020   Chronic lower back pain 07/27/2020   S/P placement of cardiac pacemaker 06/25/20  06/26/2020   Chronic anticoagulation 03/08/2020   Hemiparesis and numbness affecting left side as late effect of cerebrovascular accident (CVA) ([place]) 03/08/2020   History of Falls with injury 03/08/2020   Hypoalbuminemia 03/08/2020   Secondary hypercoagulable state ([place]) 11/12/2019   Dependent for wheelchair mobility 08/05/2019   OSA (obstructive sleep apnea) 05/27/2019   Paroxysmal atrial fibrillation ([place]) 05/27/2019   Insulin pump in place    (HFpEF) heart failure with preserved ejection fraction ([place]) 03/17/2019   Mood disorder ([place]) 10/29/2018   Hypertension associated with diabetes ([place]) 10/29/2015   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 ([place])    Lower extremity edema 08/20/2013   Morbid obesity ([place]) 05/29/2013   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 ([place]) 93/23/5573   Hypothyroidism, 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   GERD (gastroesophageal reflux disease) 01/25/2012    Conditions to be addressed/monitored: Level of care concerns  Care Plan : Social Work  Updates made by [name] Cane, LCSW since 06/07/2021 12:00 AM   Problem: Mobility and Independence      Goal: Mobility and Independence Optimized   Start Date: 06/07/2021  This Visit's Progress: On track  Priority: High  Current barriers:   Care Coordination needed to assist with Home Health for PT; barriers  with referral Currently unable to  independently self navigate needs related to chronic health conditions.  Clinical Goals:   Over the next 30 days, patient will work with  Alvis Lemmings to address unmet needs related to physical Therapy. Interventions: Assessed patient needs, how currently meeting need and barriers to care Patient expressed concern with stiffness and needing physical therapy; PCP previously placed order May 24th  PCP placed Refer to physical therapy or occupational therapy for assessment and individualized program.  Collaboration with [facility] confirmation of receipt of orders at per [facility] they did not receive the fax Collaborated with [facility] Referral Coordinator ref. Resending HH order for PT and office notes from provider Clinical interventions provided: Solution-Focused Strategies and Problem [place] care team collaboration (see longitudinal plan of care) Patient Goals/Self-Care Activities: Over the next 30 days I will coordinate with your doctor to assist with getting your physical therapy Call office if no one has called you in 1 weeks     Casimer Lanius, [place] / [employer]   [phone] 2:50  PM

## 2021-06-07 NOTE — Patient Instructions (Signed)
Visit Information   Goals Addressed             This Visit's Progress    Maintain Mobility and Function       Self Care - Activities  I will coordinate with your doctor to assist with getting your physical therapy Call office if no one has called you in 1 weeks      Patient verbalizes understanding of instructions provided today and agrees to view in [place].   No further follow up required: at this time, will follow up with patient on progress of [place], [place] / [place]   [phone] 2:52 PM

## 2021-06-07 NOTE — Telephone Encounter (Signed)
Patient calls nurse line stating she saw her results on mychart and would like to know the next steps. Will forward to provider who saw patient.

## 2021-06-08 ENCOUNTER — Ambulatory Visit: Payer: Medicare PPO | Admitting: Licensed Clinical Social Worker

## 2021-06-08 ENCOUNTER — Encounter: Payer: Self-pay | Admitting: Family Medicine

## 2021-06-08 DIAGNOSIS — Z7189 Other specified counseling: Secondary | ICD-10-CM

## 2021-06-08 NOTE — Chronic Care Management (AMB) (Signed)
Care Management   Clinical Social Work Note  06/08/2021 Name: Tangy Drozdowski MRN: [id number] DOB: 08-May-1951  [name] Cesilia Shinn is a 70 y.o. year old female who is a primary care patient of [name], [name], MD. The CCM team was consulted to assist the patient with chronic disease management and/or care coordination needs related to: Level of Care Concerns for [place] with patient by telephone for follow up visit in response to provider referral for social work chronic care management and care coordination services.   Consent to Services: Patient agreed to services and consent obtained.   Assessment: Informed patient that Alvis Lemmings will contact her to set up [facility] PT.  Patient is appreciative of the assistance. See Care Plan below for interventions and patient self-care actives.  Follow up Plan: Patient would like continued follow-up.  CCM LCSW will follow up with patient 90 days. Patient will call office if needed prior to next encounter.    Review of patient past medical history, allergies, medications, and health status, including review of relevant consultants reports was performed today as part of a comprehensive evaluation and provision of chronic care management and care coordination services.     SDOH (Social Determinants of Health) assessments and interventions performed:    Advanced Directives Status: Not addressed in this encounter.  CCM Care Plan  Allergies  Allergen Reactions   Latex Rash    Outpatient Encounter Medications as of 06/08/2021  Medication Sig   ACCU-CHEK GUIDE test strip    acetaminophen (TYLENOL) 500 MG tablet Take 1,000 mg by mouth every 8 (eight) hours as needed for mild pain.    amLODipine (NORVASC) 10 MG tablet Take 1 tablet (10 mg total) by mouth daily.   apixaban (ELIQUIS) 5 MG TABS tablet Take 1 tablet (5 mg total) by mouth 2 (two) times daily.   atorvastatin (LIPITOR) 80 MG tablet Take 1 tablet (80 mg total) by  mouth every evening.   baclofen (LIORESAL) 10 MG tablet Take 0.5 tablets (5 mg total) by mouth 2 (two) times daily as needed for muscle spasms.   clotrimazole (LOTRIMIN) 1 % cream Apply 1 application topically 2 (two) times daily. To affect area of foot   Continuous Blood Gluc Transmit (DEXCOM G6 TRANSMITTER) MISC by Does not apply route. Checks her blood sugar   CRANBERRY EXTRACT PO Take 1 capsule by mouth daily.   fexofenadine (ALLEGRA) 180 MG tablet Take 180 mg by mouth daily as needed for allergies or rhinitis.   FLUoxetine (PROZAC) 10 MG capsule TAKE ONE CAPSULE BY MOUTH EVERY DAY WITH 20MG  DOSE   FLUoxetine (PROZAC) 20 MG tablet TAKE 1 TABLET BY MOUTH EVERY DAY ALONG WITH 10 MG FOR A TOTAL DOSE OF 30 MG EVERY DAY   gabapentin (NEURONTIN) 300 MG capsule Take 1 capsule (300 mg total) by mouth 3 (three) times daily.   insulin aspart (NOVOLOG) 100 UNIT/ML injection Inject 20-30 Units into the skin daily. To be used in patient's insulin pump   Insulin Human (INSULIN PUMP) SOLN Inject 1 each into the skin continuous. Novolog insulin   levothyroxine (SYNTHROID) 175 MCG tablet TAKE 1 TABLET(175 MCG) BY MOUTH DAILY BEFORE BREAKFAST   loperamide (IMODIUM A-D) 2 MG tablet Take 1 tablet (2 mg total) by mouth 4 (four) times daily as needed for diarrhea or loose stools.   losartan (COZAAR) 100 MG tablet Take 1 tablet (100 mg total) by mouth daily.   Multiple Vitamins-Minerals (OCUVITE PO) Take 1 tablet  by mouth daily.   pantoprazole (PROTONIX) 40 MG tablet Take 1 tablet (40 mg total) by mouth 2 (two) times daily.   potassium chloride (KLOR-CON) 10 MEQ tablet Take 1 tablet (10 mEq total) by mouth daily.   torsemide (DEMADEX) 10 MG tablet TAKE 1 TABLET(10 MG) BY MOUTH DAILY AS NEEDED   No facility-administered encounter medications on file as of 06/08/2021.    Patient Active Problem List   Diagnosis Date Noted   Stiffness due to immobility 05/18/2021   Healthcare maintenance 02/17/2021   Mild  cognitive impairment 09/03/2020   Presbyopia 09/03/2020   Counseling regarding advanced directives 09/03/2020   Constipation 09/03/2020   Imbalance 09/03/2020   Decreased range of motion of left shoulder 09/03/2020   Chronic lower back pain 07/27/2020   S/P placement of cardiac pacemaker 06/25/20  06/26/2020   Chronic anticoagulation 03/08/2020   Hemiparesis and numbness affecting left side as late effect of cerebrovascular accident (CVA) ([place]) 03/08/2020   History of Falls with injury 03/08/2020   Hypoalbuminemia 03/08/2020   Secondary hypercoagulable state ([place]) 11/12/2019   Dependent for wheelchair mobility 08/05/2019   OSA (obstructive sleep apnea) 05/27/2019   Paroxysmal atrial fibrillation ([place]) 05/27/2019   Insulin pump in place    (HFpEF) heart failure with preserved ejection fraction ([place]) 03/17/2019   Mood disorder ([place]) 10/29/2018   Hypertension associated with diabetes ([place]) 10/29/2015   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 ([place])    Lower extremity edema 08/20/2013   Morbid obesity ([place]) 05/29/2013   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 ([place]) 45/62/5638   Hypothyroidism, 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   GERD (gastroesophageal reflux disease) 01/25/2012    Conditions to be addressed/monitored: Level of care concerns and ADL IADL limitations  Care Plan : Social Work  Updates made by Maurine Cane, LCSW since 06/08/2021 12:00 AM   Problem: Mobility and Independence    Goal: Mobility and Independence Optimized   Start Date: 06/07/2021  This Visit's Progress: On track  Recent Progress: On track  Priority: High  Current barriers:   Care Coordination needed to assist with Home Health for PT; barriers with referral Currently unable to  independently self navigate needs related to chronic health conditions.  Clinical Goals:   Over the next 30 days, patient will work with  Alvis Lemmings to address unmet needs related to physical  Therapy. Interventions: Provided patient and update that Alvis Lemmings would be contacting her to schedule PT Collaboration with Cindi from [facility] confirmation of receipt of orders/ referral and they are able to provide service to patient Collaborated with [facility] Referral Coordinator ref. HH order for PT and office notes from provider Clinical interventions provided: Solution-Focused Strategies and Problem [place]   PCP placed Refer to physical therapy or occupational therapy for assessment and individualized program.  Inter-disciplinary care team collaboration (see longitudinal plan of care) Patient Goals/Self-Care Activities: Over the next 30 days I have spoken with Alvis Lemmings they will contact you for physical therapy Call office if no one has called you in 1 weeks     Casimer Lanius, [place] / [place]   [phone] 11:38 AM

## 2021-06-08 NOTE — Patient Instructions (Signed)
Visit Information   Goals Addressed             This Visit's Progress    Maintain Mobility and Function   On track    Self Care - Activities  I have spoken with Alvis Lemmings they will contact you for physical therapy Call office if no one has called you in 1 weeks      Patient verbalizes understanding of instructions provided today and agrees to view in [place].   No follow up scheduled with LCSW, will reach out in 90 days.  Casimer Lanius, LCSW Care Management & Coordination  [phone]

## 2021-06-09 ENCOUNTER — Other Ambulatory Visit (HOSPITAL_COMMUNITY): Payer: Self-pay | Admitting: Student in an Organized Health Care Education/Training Program

## 2021-06-09 MED ORDER — LEVOTHYROXINE SODIUM 200 MCG PO TABS: 200.0000 ug | ORAL_TABLET | Freq: Every day | ORAL | 0 refills | Status: DC

## 2021-06-10 ENCOUNTER — Ambulatory Visit: Payer: Medicare PPO | Admitting: Licensed Clinical Social Worker

## 2021-06-10 DIAGNOSIS — M545 Low back pain, unspecified: Secondary | ICD-10-CM | POA: Diagnosis not present

## 2021-06-10 DIAGNOSIS — I69354 Hemiplegia and hemiparesis following cerebral infarction affecting left non-dominant side: Secondary | ICD-10-CM | POA: Diagnosis not present

## 2021-06-10 DIAGNOSIS — E1069 Type 1 diabetes mellitus with other specified complication: Secondary | ICD-10-CM | POA: Diagnosis not present

## 2021-06-10 DIAGNOSIS — Z950 Presence of cardiac pacemaker: Secondary | ICD-10-CM | POA: Diagnosis not present

## 2021-06-10 DIAGNOSIS — Z993 Dependence on wheelchair: Secondary | ICD-10-CM | POA: Diagnosis not present

## 2021-06-10 DIAGNOSIS — M1990 Unspecified osteoarthritis, unspecified site: Secondary | ICD-10-CM | POA: Diagnosis not present

## 2021-06-10 DIAGNOSIS — E782 Mixed hyperlipidemia: Secondary | ICD-10-CM | POA: Diagnosis not present

## 2021-06-10 DIAGNOSIS — Z719 Counseling, unspecified: Secondary | ICD-10-CM

## 2021-06-10 DIAGNOSIS — M47812 Spondylosis without myelopathy or radiculopathy, cervical region: Secondary | ICD-10-CM | POA: Diagnosis not present

## 2021-06-10 DIAGNOSIS — E890 Postprocedural hypothyroidism: Secondary | ICD-10-CM | POA: Diagnosis not present

## 2021-06-10 NOTE — Patient Instructions (Signed)
Visit Information   Goals Addressed             This Visit's Progress    Maintain Mobility and Function       Self Care - Activities  I have spoken with Alvis Lemmings they will contact you for physical therapy Call office if no one has called you in 1 weeks        Patient verbalizes understanding of instructions provided today and agrees to view in [place].   No further follow up required: I will contact in 90 days.  Call the office if needs [place], [place]

## 2021-06-10 NOTE — Chronic Care Management (AMB) (Signed)
Care Management   Clinical Social Work Note  06/10/2021 Name: [name] MRN: [id number] DOB: [birth year]  [name] [name] is a 70 y.o. year old female who is a primary care patient of [name], [name], MD. The CCM team was consulted to assist the patient with chronic disease management and/or care coordination needs related to: Level of Care Concerns.   Engaged with patient by telephone for follow up visit in response to provider referral for social work chronic care management and care coordination services.   Consent to Services: Patient agreed to services and consent obtained.   Assessment: Patient would like PCP to complete forms for her related to her health and inability to make payments.  Patient will share the form once she receives it.  LCSW will review information with patient and collaborate with PCP as needed.  [name] has not contacted patient for PT.  Anticipate they will call this week . See Care Plan below for interventions and patient self-care actives. Recent life changes /stressors: not able to make loan payments and would PCP assistance with a loan forgiveness program Recommendation: Patient may benefit from, and is in agreement to contact LCSW once she receives the information.  LCSW will collaborate with PCP.  Follow up Plan: Patient would like continued follow-up.  CCM LCSW will follow up with patient in 90 days or as needed. Patient will call office if needed prior to next encounter.   Review of patient past medical history, allergies, medications, and health status, including review of relevant consultants reports was performed today as part of a comprehensive evaluation and provision of chronic care management and care coordination services.     SDOH (Social Determinants of Health) assessments and interventions performed:    Advanced Directives Status: Not addressed in this encounter.  CCM Care Plan  Allergies  Allergen Reactions    Latex Rash    Outpatient Encounter Medications as of 06/10/2021  Medication Sig   ACCU-CHEK GUIDE test strip    acetaminophen (TYLENOL) 500 MG tablet Take 1,000 mg by mouth every 8 (eight) hours as needed for mild pain.    amLODipine (NORVASC) 10 MG tablet Take 1 tablet (10 mg total) by mouth daily.   apixaban (ELIQUIS) 5 MG TABS tablet Take 1 tablet (5 mg total) by mouth 2 (two) times daily.   atorvastatin (LIPITOR) 80 MG tablet Take 1 tablet (80 mg total) by mouth every evening.   baclofen (LIORESAL) 10 MG tablet Take 0.5 tablets (5 mg total) by mouth 2 (two) times daily as needed for muscle spasms.   clotrimazole (LOTRIMIN) 1 % cream Apply 1 application topically 2 (two) times daily. To affect area of foot   Continuous Blood Gluc Transmit (DEXCOM G6 TRANSMITTER) MISC by Does not apply route. Checks her blood sugar   CRANBERRY EXTRACT PO Take 1 capsule by mouth daily.   fexofenadine (ALLEGRA) 180 MG tablet Take 180 mg by mouth daily as needed for allergies or rhinitis.   FLUoxetine (PROZAC) 10 MG capsule TAKE ONE CAPSULE BY MOUTH EVERY DAY WITH 20MG  DOSE   FLUoxetine (PROZAC) 20 MG tablet TAKE 1 TABLET BY MOUTH EVERY DAY ALONG WITH 10 MG FOR A TOTAL DOSE OF 30 MG EVERY DAY   gabapentin (NEURONTIN) 300 MG capsule Take 1 capsule (300 mg total) by mouth 3 (three) times daily.   insulin aspart (NOVOLOG) 100 UNIT/ML injection Inject 20-30 Units into the skin daily. To be used in patient's insulin pump   Insulin Human (  INSULIN PUMP) SOLN Inject 1 each into the skin continuous. Novolog insulin   levothyroxine (SYNTHROID) 200 MCG tablet Take 1 tablet (200 mcg total) by mouth daily before breakfast.   loperamide (IMODIUM A-D) 2 MG tablet Take 1 tablet (2 mg total) by mouth 4 (four) times daily as needed for diarrhea or loose stools.   losartan (COZAAR) 100 MG tablet Take 1 tablet (100 mg total) by mouth daily.   Multiple Vitamins-Minerals (OCUVITE PO) Take 1 tablet by mouth daily.   pantoprazole  (PROTONIX) 40 MG tablet Take 1 tablet (40 mg total) by mouth 2 (two) times daily.   potassium chloride (KLOR-CON) 10 MEQ tablet Take 1 tablet (10 mEq total) by mouth daily.   torsemide (DEMADEX) 10 MG tablet TAKE 1 TABLET(10 MG) BY MOUTH DAILY AS NEEDED   No facility-administered encounter medications on file as of 06/10/2021.    Patient Active Problem List   Diagnosis Date Noted   Stiffness due to immobility 05/18/2021   Healthcare maintenance 02/17/2021   Mild cognitive impairment 09/03/2020   Presbyopia 09/03/2020   Counseling regarding advanced directives 09/03/2020   Constipation 09/03/2020   Imbalance 09/03/2020   Decreased range of motion of left shoulder 09/03/2020   Chronic lower back pain 07/27/2020   S/P placement of cardiac pacemaker 06/25/20  06/26/2020   Chronic anticoagulation 03/08/2020   Hemiparesis and numbness affecting left side as late effect of cerebrovascular accident (CVA) ([place]) 03/08/2020   History of Falls with injury 03/08/2020   Hypoalbuminemia 03/08/2020   Secondary hypercoagulable state ([place]) 11/12/2019   Dependent for wheelchair mobility 08/05/2019   OSA (obstructive sleep apnea) 05/27/2019   Paroxysmal atrial fibrillation ([place]) 05/27/2019   Insulin pump in place    (HFpEF) heart failure with preserved ejection fraction ([place]) 03/17/2019   Mood disorder ([place]) 10/29/2018   Hypertension associated with diabetes ([place]) 10/29/2015   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 ([place])    Lower extremity edema 08/20/2013   Morbid obesity ([place]) 05/29/2013   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 ([place]) 65/78/4696   Hypothyroidism, 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   GERD (gastroesophageal reflux disease) 01/25/2012    Conditions to be addressed/monitored:  Level of care concerns  Care Plan : Social Work  Updates made by Maurine Cane, LCSW since 06/10/2021 12:00 AM   Problem: Mobility and Independence    Goal: Mobility  and Independence Optimized   Start Date: 06/07/2021  Recent Progress: On track  Priority: High  Current barriers:   As of today Patient still has not received a call from [place] needed to assist with [facility] for PT; barriers with referral Currently unable to  independently self navigate needs related to chronic health conditions.  Clinical Goals:   Over the next 30 days, patient will work with  [name] to address unmet needs related to physical Therapy. Interventions: Provided patient and update that [name] would be contacting her to schedule PT Collaboration with Cindi from [facility] confirmation of receipt of orders/ referral and they are able to provide service to patient Collaborated with [facility] Referral Coordinator ref. HH order for PT and office notes from provider Clinical interventions provided: Solution-Focused Strategies and Problem [place]   PCP placed Refer to physical therapy or occupational therapy for assessment and individualized program.  Inter-disciplinary care team collaboration (see longitudinal plan of care) Patient Goals/Self-Care Activities: Over the next 30 days I have spoken with [name] they will contact you for physical therapy Call office if  no one has called you in 1 weeks     Casimer Lanius, [place] / [place]   [phone] 10:30 AM

## 2021-06-13 DIAGNOSIS — E782 Mixed hyperlipidemia: Secondary | ICD-10-CM | POA: Diagnosis not present

## 2021-06-13 DIAGNOSIS — M545 Low back pain, unspecified: Secondary | ICD-10-CM | POA: Diagnosis not present

## 2021-06-13 DIAGNOSIS — M1990 Unspecified osteoarthritis, unspecified site: Secondary | ICD-10-CM | POA: Diagnosis not present

## 2021-06-13 DIAGNOSIS — Z950 Presence of cardiac pacemaker: Secondary | ICD-10-CM | POA: Diagnosis not present

## 2021-06-13 DIAGNOSIS — I69354 Hemiplegia and hemiparesis following cerebral infarction affecting left non-dominant side: Secondary | ICD-10-CM | POA: Diagnosis not present

## 2021-06-13 DIAGNOSIS — E890 Postprocedural hypothyroidism: Secondary | ICD-10-CM | POA: Diagnosis not present

## 2021-06-13 DIAGNOSIS — M47812 Spondylosis without myelopathy or radiculopathy, cervical region: Secondary | ICD-10-CM | POA: Diagnosis not present

## 2021-06-13 DIAGNOSIS — Z993 Dependence on wheelchair: Secondary | ICD-10-CM | POA: Diagnosis not present

## 2021-06-13 DIAGNOSIS — E1069 Type 1 diabetes mellitus with other specified complication: Secondary | ICD-10-CM | POA: Diagnosis not present

## 2021-06-14 DIAGNOSIS — E890 Postprocedural hypothyroidism: Secondary | ICD-10-CM | POA: Diagnosis not present

## 2021-06-14 DIAGNOSIS — I69354 Hemiplegia and hemiparesis following cerebral infarction affecting left non-dominant side: Secondary | ICD-10-CM | POA: Diagnosis not present

## 2021-06-14 DIAGNOSIS — Z993 Dependence on wheelchair: Secondary | ICD-10-CM | POA: Diagnosis not present

## 2021-06-14 DIAGNOSIS — E782 Mixed hyperlipidemia: Secondary | ICD-10-CM | POA: Diagnosis not present

## 2021-06-14 DIAGNOSIS — M47812 Spondylosis without myelopathy or radiculopathy, cervical region: Secondary | ICD-10-CM | POA: Diagnosis not present

## 2021-06-14 DIAGNOSIS — Z950 Presence of cardiac pacemaker: Secondary | ICD-10-CM | POA: Diagnosis not present

## 2021-06-14 DIAGNOSIS — M1990 Unspecified osteoarthritis, unspecified site: Secondary | ICD-10-CM | POA: Diagnosis not present

## 2021-06-14 DIAGNOSIS — E1069 Type 1 diabetes mellitus with other specified complication: Secondary | ICD-10-CM | POA: Diagnosis not present

## 2021-06-14 DIAGNOSIS — M545 Low back pain, unspecified: Secondary | ICD-10-CM | POA: Diagnosis not present

## 2021-06-17 ENCOUNTER — Encounter: Payer: Self-pay | Admitting: Podiatry

## 2021-06-17 ENCOUNTER — Ambulatory Visit: Payer: Medicare PPO | Admitting: Podiatry

## 2021-06-17 ENCOUNTER — Other Ambulatory Visit: Payer: Self-pay

## 2021-06-17 DIAGNOSIS — E782 Mixed hyperlipidemia: Secondary | ICD-10-CM | POA: Diagnosis not present

## 2021-06-17 DIAGNOSIS — E1069 Type 1 diabetes mellitus with other specified complication: Secondary | ICD-10-CM | POA: Diagnosis not present

## 2021-06-17 DIAGNOSIS — E890 Postprocedural hypothyroidism: Secondary | ICD-10-CM | POA: Diagnosis not present

## 2021-06-17 DIAGNOSIS — M79675 Pain in left toe(s): Secondary | ICD-10-CM

## 2021-06-17 DIAGNOSIS — B351 Tinea unguium: Secondary | ICD-10-CM

## 2021-06-17 DIAGNOSIS — M47812 Spondylosis without myelopathy or radiculopathy, cervical region: Secondary | ICD-10-CM | POA: Diagnosis not present

## 2021-06-17 DIAGNOSIS — D689 Coagulation defect, unspecified: Secondary | ICD-10-CM

## 2021-06-17 DIAGNOSIS — E1159 Type 2 diabetes mellitus with other circulatory complications: Secondary | ICD-10-CM

## 2021-06-17 DIAGNOSIS — M79674 Pain in right toe(s): Secondary | ICD-10-CM | POA: Diagnosis not present

## 2021-06-17 DIAGNOSIS — I69354 Hemiplegia and hemiparesis following cerebral infarction affecting left non-dominant side: Secondary | ICD-10-CM | POA: Diagnosis not present

## 2021-06-17 DIAGNOSIS — Z950 Presence of cardiac pacemaker: Secondary | ICD-10-CM | POA: Diagnosis not present

## 2021-06-17 DIAGNOSIS — M545 Low back pain, unspecified: Secondary | ICD-10-CM | POA: Diagnosis not present

## 2021-06-17 DIAGNOSIS — Z993 Dependence on wheelchair: Secondary | ICD-10-CM | POA: Diagnosis not present

## 2021-06-17 DIAGNOSIS — M1990 Unspecified osteoarthritis, unspecified site: Secondary | ICD-10-CM | POA: Diagnosis not present

## 2021-06-17 NOTE — Progress Notes (Signed)
This patient returns to my office for at risk foot care.  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  This patient is unable to cut nails herself  since the patient cannot reach her  nails.These nails are painful walking and wearing shoes.  Patient presents in a wheelchair .  This patient presents for at risk foot care today. She says she is having left ankle pain with swelling left leg.  General Appearance  Alert, conversant and in no acute stress.  Vascular  Dorsalis pedis and posterior tibial  pulses are weakly  palpable  bilaterally.  Capillary return is within normal limits  bilaterally. Temperature is within normal limits  bilaterally. 3+ swelling left ankle/leg.  2+ swelling right leg.  Neurologic  Senn-Weinstein monofilament wire diminished  bilaterally. Muscle power within normal limits bilaterally.  Nails Thick disfigured discolored nails with subungual debris  from hallux to fifth toes bilaterally. No evidence of bacterial infection or drainage bilaterally.  Orthopedic  No limitations of motion  feet .  No crepitus or effusions noted.  No bony pathology or digital deformities noted.  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  Skin  normotropic skin with no porokeratosis noted bilaterally.  No signs of infections or ulcers noted.     Onychomycosis  Pain in right toes  Pain in left toes  Consent was obtained for treatment procedures.   Mechanical debridement of nails 1-5  bilaterally performed with a nail nipper.  Filed with dremel without incident. No infection or ulcer.  Dr.  March Rummage was contacted and evaluated her left extremity.  He thought there was no DVT  and taking eliquis was proper treatment for possible  DVT.  Told her if the condition worsens she need to be seen in ED.  Gardiner Barefoot DPM    Return office visit    3 months       Told patient to return for periodic foot care and  evaluation due to potential at risk complications.   Gardiner Barefoot DPM

## 2021-06-20 ENCOUNTER — Telehealth: Payer: Self-pay | Admitting: Family Medicine

## 2021-06-20 DIAGNOSIS — M545 Low back pain, unspecified: Secondary | ICD-10-CM | POA: Diagnosis not present

## 2021-06-20 DIAGNOSIS — Z950 Presence of cardiac pacemaker: Secondary | ICD-10-CM | POA: Diagnosis not present

## 2021-06-20 DIAGNOSIS — M1990 Unspecified osteoarthritis, unspecified site: Secondary | ICD-10-CM | POA: Diagnosis not present

## 2021-06-20 DIAGNOSIS — E890 Postprocedural hypothyroidism: Secondary | ICD-10-CM | POA: Diagnosis not present

## 2021-06-20 DIAGNOSIS — E1069 Type 1 diabetes mellitus with other specified complication: Secondary | ICD-10-CM | POA: Diagnosis not present

## 2021-06-20 DIAGNOSIS — E782 Mixed hyperlipidemia: Secondary | ICD-10-CM | POA: Diagnosis not present

## 2021-06-20 DIAGNOSIS — Z993 Dependence on wheelchair: Secondary | ICD-10-CM | POA: Diagnosis not present

## 2021-06-20 DIAGNOSIS — I69354 Hemiplegia and hemiparesis following cerebral infarction affecting left non-dominant side: Secondary | ICD-10-CM | POA: Diagnosis not present

## 2021-06-20 DIAGNOSIS — M47812 Spondylosis without myelopathy or radiculopathy, cervical region: Secondary | ICD-10-CM | POA: Diagnosis not present

## 2021-06-20 NOTE — Telephone Encounter (Signed)
Patient is calling stating her legs are swollen, podiatrist told her to tell doctor she is taking furosemide once a day instead of every other day and is wanting to know how long she needs to take it. Please advise. Thanks!

## 2021-06-21 ENCOUNTER — Telehealth: Payer: Self-pay | Admitting: Family Medicine

## 2021-06-21 NOTE — Telephone Encounter (Signed)
Disability form dropped off for at front desk for completion.  Verified that patient section of form has been completed.  Last DOS/WCC with PCP was 06/06/21.  Placed form in team folder to be completed by clinical staff.  Creig Hines

## 2021-06-21 NOTE — Telephone Encounter (Signed)
Reviewed form and placed in PCP's box for completion.  .Naasia Weilbacher R Patrisia Faeth, CMA  

## 2021-06-22 ENCOUNTER — Telehealth: Payer: Self-pay | Admitting: Family Medicine

## 2021-06-22 NOTE — Telephone Encounter (Addendum)
  Contacted patient via telephone to follow-up on leg swelling and question of daily Lasix.  Patient states that she started taking Lasix daily 5 days ago when she noticed her leg swelling.  She reports that she did not take Lasix yesterday as the leg swelling has improved.  Patient states that she has been noticing some shortness of breath with moving around.  Patient states that this is not as bothersome.  Discussed to ED precautions or visiting clinic to assess her breathing however patient states that her home health nurse told her that her pulmonary exam did not have any abnormal findings and so she would like to wait for her cardiology visit.  Again, emphasized that if her respiratory status worsened or she had increased leg swelling that she would need to be evaluated as soon as possible.  Patient voiced understanding.  Advised patient can return to taking Lasix every other day as previously prescribed.  Inform patient of receipt of paperwork regarding electric wheelchair reverification.  Schedule patient for appointment with PCP on 7/27 at 10:10 AM.   Eulis Foster, MD [place], PGY-2 [phone]

## 2021-06-22 NOTE — Telephone Encounter (Signed)
Patient contacted to follow up on leg swelling, lasix. Also scheduled for follow up appt to discuss electric wheel chair re-verification.

## 2021-06-23 DIAGNOSIS — E1065 Type 1 diabetes mellitus with hyperglycemia: Secondary | ICD-10-CM | POA: Diagnosis not present

## 2021-06-23 NOTE — Telephone Encounter (Signed)
Section 4 of form completed, signed and dated. Forms placed in front office folder. Please call family for pick up.   Eulis Foster, MD [place], PGY-2 [phone]

## 2021-06-24 ENCOUNTER — Encounter (HOSPITAL_COMMUNITY): Payer: Self-pay | Admitting: Emergency Medicine

## 2021-06-24 ENCOUNTER — Ambulatory Visit (HOSPITAL_COMMUNITY)
Admission: EM | Admit: 2021-06-24 | Discharge: 2021-06-24 | Disposition: A | Discharge status: Home / Self Care | Payer: Medicare PPO | Attending: Family Medicine | Admitting: Family Medicine

## 2021-06-24 ENCOUNTER — Other Ambulatory Visit: Payer: Self-pay

## 2021-06-24 DIAGNOSIS — M79672 Pain in left foot: Secondary | ICD-10-CM | POA: Diagnosis not present

## 2021-06-24 DIAGNOSIS — L989 Disorder of the skin and subcutaneous tissue, unspecified: Secondary | ICD-10-CM

## 2021-06-24 MED ORDER — CEPHALEXIN 500 MG PO CAPS: 500.0000 mg | ORAL_CAPSULE | Freq: Two times a day (BID) | ORAL | 0 refills | Status: DC

## 2021-06-24 NOTE — Discharge Instructions (Addendum)
Follow-up with your primary care provider for recheck in 1 week.  Epsom salt soaks, leg elevation recommended.  Go to the emergency department if significantly worsening over the weekend.

## 2021-06-24 NOTE — Telephone Encounter (Signed)
Form placed up front for pick up. A copy was made for batch scanning. Unable to inform patient.

## 2021-06-24 NOTE — ED Triage Notes (Signed)
Pt reports left heel pain. Reports that skin is intact, but area is painful.   Pain present for 2 weeks.

## 2021-06-28 NOTE — ED Provider Notes (Signed)
[facility]    CSN: [id number] Arrival date & time: 06/24/21  0946      History   Chief Complaint Chief Complaint  Patient presents with   Foot Pain    Left heel pain    HPI [name] is a 70 y.o. female.   Patient presenting today with 2 week history of worsening left heel pain. Her sister told her there was a dark spot in the area of her pain but she is unable to see the area. Denies injury, drainage from the area, fever, chills, numbness or tingling. Very painful to weight bear so has been trying to stay off of it. Applying silver patches and keeping covered. States she has seen her Podiatrist and PCP since onset but that neither have made recommendations regarding what is going on or what to do for it. 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) 03/08/2020   CHF (congestive heart failure) ([place])    Closed fracture of left proximal humerus 07/08/2019   Decreased range of motion of left shoulder 09/03/2020   Depression    Dizzy 03/07/2020   bradycardia   Edentulous 09/03/2020   GERD (gastroesophageal reflux disease)    Hemiparesis affecting left side as late effect of cerebrovascular accident (CVA) ([place]) 03/08/2020   Hemorrhoids    History of Falls with injury 03/08/2020   Fall resulting in left humeral fracture   Hyperlipidemia    Hypertension    Hypothyroidism    Intracranial vascular stenosis    Near syncope 03/08/2020   Proteinuria 03/08/2020   Seasonal allergies    Stroke [facility])    Thyroid cancer ([place])    per pt S/p Total Thyroidectomy with Radioactive Iodine Therapy   Type 1 diabetes mellitus ([place])     Patient Active Problem List   Diagnosis Date  Noted   Stiffness due to immobility 05/18/2021   Healthcare maintenance 02/17/2021   Mild cognitive impairment 09/03/2020   Presbyopia 09/03/2020   Counseling regarding advanced directives 09/03/2020   Constipation 09/03/2020   Imbalance 09/03/2020   Decreased range of motion of left shoulder 09/03/2020   Chronic lower back pain 07/27/2020   S/P placement of cardiac pacemaker 06/25/20  06/26/2020   Chronic anticoagulation 03/08/2020   Hemiparesis and numbness affecting left side as late effect of cerebrovascular accident (CVA) ([place]) 03/08/2020   History of Falls with injury 03/08/2020   Hypoalbuminemia 03/08/2020   Secondary hypercoagulable state ([place]) 11/12/2019   Dependent for wheelchair mobility 08/05/2019   OSA (obstructive sleep apnea) 05/27/2019   Paroxysmal atrial fibrillation ([place]) 05/27/2019   Insulin pump in place    (HFpEF) heart failure with preserved ejection fraction ([place]) 03/17/2019   Mood disorder ([place]) 10/29/2018   Hypertension associated with diabetes ([place]) 10/29/2015   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 ([place])    Lower extremity edema 08/20/2013   Morbid obesity ([place]) 05/29/2013   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 ([place]) 22/97/9892   Hypothyroidism, 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   GERD (gastroesophageal reflux disease) 01/25/2012    Past Surgical History:  Procedure Laterality Date   ABDOMINAL ANGIOGRAM N/A 05/14/2012   Procedure: ABDOMINAL ANGIOGRAM;  Surgeon: Laverda Page, MD;  Location: [facility] CATH LAB;  Service: Cardiovascular;  Laterality: N/A;   ABDOMINAL HYSTERECTOMY  1995   partial   ANKLE FRACTURE SURGERY Right    CARDIAC CATHETERIZATION     with coronary angiogram   cataract  Bilateral 2018   COLONOSCOPY N/A 09/09/2014   Procedure: COLONOSCOPY;  Surgeon: Gatha Mayer, MD;  Location: [place];  Service: Endoscopy;  Laterality: N/A;   ESOPHAGOGASTRODUODENOSCOPY (EGD) WITH PROPOFOL N/A 12/03/2018    Procedure: ESOPHAGOGASTRODUODENOSCOPY (EGD) WITH PROPOFOL;  Surgeon: Doran Stabler, MD;  Location: [place];  Service: Gastroenterology;  Laterality: N/A;   LEFT AND RIGHT HEART CATHETERIZATION WITH CORONARY ANGIOGRAM N/A 05/14/2012   Procedure: LEFT AND RIGHT HEART CATHETERIZATION WITH CORONARY ANGIOGRAM;  Surgeon: Laverda Page, MD;  Location: [facility] CATH LAB;  Service: Cardiovascular;  Laterality: N/A;   LOOP RECORDER INSERTION N/A 12/23/2018   Procedure: LOOP RECORDER INSERTION;  Surgeon: Constance Haw, MD;  Location: [place] CV LAB;  Service: Cardiovascular;  Laterality: N/A;   LOOP RECORDER REMOVAL N/A 06/25/2020   Procedure: LOOP RECORDER REMOVAL;  Surgeon: Constance Haw, MD;  Location: [place] CV LAB;  Service: Cardiovascular;  Laterality: N/A;   MINI METER   09/08/2014   ELECTRONIC INSULIN PUMP   PACEMAKER IMPLANT N/A 06/25/2020   Procedure: PACEMAKER IMPLANT;  Surgeon: Constance Haw, MD;  Location: [place] CV LAB;  Service: Cardiovascular;  Laterality: N/A;   TEE WITHOUT CARDIOVERSION N/A 10/16/2018   Procedure: TRANSESOPHAGEAL ECHOCARDIOGRAM (TEE);  Surgeon: Buford Dresser, MD;  Location: [facility] ENDOSCOPY;  Service: Cardiovascular;  Laterality: N/A;   THYROIDECTOMY  2006    OB History   No obstetric history on file.      Home Medications    Prior to Admission medications   Medication Sig Start Date End Date Taking? Authorizing Provider  cephALEXin (KEFLEX) 500 MG capsule Take 1 capsule (500 mg total) by mouth 2 (two) times daily. 06/24/21  Yes Volney American, PA-C  ACCU-CHEK GUIDE test strip  01/05/20   [provider]  acetaminophen (TYLENOL) 500 MG tablet Take 1,000 mg by mouth every 8 (eight) hours as needed for mild pain.     [provider]  amLODipine (NORVASC) 10 MG tablet Take 1 tablet (10 mg total) by mouth daily. 02/15/21 08/14/21  [name], [name], MD  apixaban (ELIQUIS) 5 MG TABS tablet Take  1 tablet (5 mg total) by mouth 2 (two) times daily. 10/07/20   Camnitz, Ocie Doyne, MD  atorvastatin (LIPITOR) 80 MG tablet Take 1 tablet (80 mg total) by mouth every evening. 02/24/21   [name], [name], MD  baclofen (LIORESAL) 10 MG tablet Take 0.5 tablets (5 mg total) by mouth 2 (two) times daily as needed for muscle spasms. 06/06/21   Anderson, Chelsey L, DO  clotrimazole (LOTRIMIN) 1 % cream Apply 1 application topically 2 (two) times daily. To affect area of foot 01/03/21   [name], [place], MD  Continuous Blood Gluc Transmit (DEXCOM G6 TRANSMITTER) MISC by Does not apply route. Checks her blood sugar    [provider]  CRANBERRY EXTRACT PO Take 1 capsule by mouth daily.    [provider]  fexofenadine (ALLEGRA) 180 MG tablet Take 180 mg by mouth daily as needed for allergies or rhinitis.    [provider]  FLUoxetine (PROZAC) 10 MG capsule TAKE ONE CAPSULE BY MOUTH EVERY DAY WITH 20MG  DOSE 02/01/21   [name], [name], MD  FLUoxetine (PROZAC) 20 MG  tablet TAKE 1 TABLET BY MOUTH EVERY DAY ALONG WITH 10 MG FOR A TOTAL DOSE OF 30 MG EVERY DAY 09/13/20   [name], [place], MD  gabapentin (NEURONTIN) 300 MG capsule Take 1 capsule (300 mg total) by mouth 3 (three) times daily. 01/07/21   [name], Makiera, MD  insulin aspart (NOVOLOG) 100 UNIT/ML injection Inject 20-30 Units into the skin daily. To be used in patient's insulin pump 01/20/21   [name], Makiera, MD  Insulin Human (INSULIN PUMP) SOLN Inject 1 each into the skin continuous. Novolog insulin 01/02/20   Guadalupe Dawn, MD  levothyroxine (SYNTHROID) 200 MCG tablet Take 1 tablet (200 mcg total) by mouth daily before breakfast. 06/09/21   Doristine Mango L, DO  loperamide (IMODIUM A-D) 2 MG tablet Take 1 tablet (2 mg total) by mouth 4 (four) times daily as needed for diarrhea or loose stools. 06/02/20   Shirley, [name], DO  losartan (COZAAR) 100 MG tablet Take 1 tablet (100  mg total) by mouth daily. 03/16/21   [name], Makiera, MD  Multiple Vitamins-Minerals (OCUVITE PO) Take 1 tablet by mouth daily.    [provider]  pantoprazole (PROTONIX) 40 MG tablet Take 1 tablet (40 mg total) by mouth 2 (two) times daily. 11/15/20   [name], [place], MD  potassium chloride (KLOR-CON) 10 MEQ tablet Take 1 tablet (10 mEq total) by mouth daily. 11/15/20   [name], Makiera, MD  torsemide (DEMADEX) 10 MG tablet TAKE 1 TABLET(10 MG) BY MOUTH DAILY AS NEEDED 01/20/21   [name], [name], MD    Family History Family History  Problem Relation Age of Onset   Heart disease Mother    Hyperlipidemia Mother    Hypertension Mother    Renal Disease Mother        insufficiency   Heart attack Mother        in his 56's   Early death Father        Head Injury at Work   Hyperlipidemia Father    Hypertension Father    Stroke Son    Diabetes Maternal Aunt    Prostate cancer Paternal Uncle    Heart disease Maternal Aunt    Heart failure Maternal Grandfather    Heart disease Maternal Grandfather    Heart attack Maternal Grandfather        Died in his 37's   Breast cancer Neg Hx     Social History Social History   Tobacco Use   Smoking status: Never   Smokeless tobacco: Never  Vaping Use   Vaping Use: Never used  Substance Use Topics   Alcohol use: No   Drug use: No     Allergies   Latex   Review of Systems Review of Systems PER HPI    Physical Exam Triage Vital Signs ED Triage Vitals  Enc Vitals Group     BP 06/24/21 1103 (!) 149/54     Pulse Rate 06/24/21 1100 74     Resp 06/24/21 1100 16     Temp 06/24/21 1100 98.8 F (37.1 C)     Temp Source 06/24/21 1100 Oral     SpO2 06/24/21 1100 100 %     Weight --      Height --      Head Circumference --      Peak Flow --      Pain Score 06/24/21 1058 4     Pain Loc --      Pain Edu? --      Excl. in  GC? --    No data found.  Updated Vital Signs BP (!)  149/54   Pulse 74   Temp 98.8 F (37.1 C) (Oral)   Resp 16   SpO2 100%   Visual Acuity Right Eye Distance:   Left Eye Distance:   Bilateral Distance:    Right Eye Near:   Left Eye Near:    Bilateral Near:     Physical Exam Vitals and nursing note reviewed.  Constitutional:      Appearance: Normal appearance. She is not ill-appearing.  HENT:     Head: Atraumatic.  Eyes:     Extraocular Movements: Extraocular movements intact.     Conjunctiva/sclera: Conjunctivae normal.  Cardiovascular:     Rate and Rhythm: Normal rate and regular rhythm.     Heart sounds: Normal heart sounds.  Pulmonary:     Effort: Pulmonary effort is normal.     Breath sounds: Normal breath sounds.  Musculoskeletal:        General: Normal range of motion.     Cervical back: Normal range of motion and neck supple.     Comments: In wheelchair due to heel pain. Good ROM in the foot, no obvious bony deformities palpable. No significant edema in the area.   Skin:    General: Skin is warm and dry.     Comments: Left posterior heel with hyperpigmented circular area about 1 cm in diameter, ttp, not draining  Neurological:     Mental Status: She is alert and oriented to person, place, and time.  Psychiatric:        Mood and Affect: Mood normal.        Thought Content: Thought content normal.        Judgment: Judgment normal.     UC Treatments / Results  Labs (all labs ordered are listed, but only abnormal results are displayed) Labs Reviewed - No data to display  EKG   Radiology No results found.  Procedures Procedures (including critical care time)  Medications Ordered in UC Medications - No data to display  Initial Impression / Assessment and Plan / UC Course  I have reviewed the triage vital signs and the nursing notes.  Pertinent labs & imaging results that were available during my care of the patient were reviewed by me and considered in my medical decision making (see chart for  details).     Unclear if pressure sore, early infection, etc. Given her hx of diabetes will cover with antibiotics and discussed foot soaks, leg elevation, close Podiatry and PCP f/u for recheck. F/u sooner if worsening in meantime.   Final Clinical Impressions(s) / UC Diagnoses   Final diagnoses:  Pain of left heel  Skin lesion     Discharge Instructions      Follow-up with your primary care provider for recheck in 1 week.  Epsom salt soaks, leg elevation recommended.  Go to the emergency department if significantly worsening over the weekend.     ED Prescriptions     Medication Sig Dispense Auth. Provider   cephALEXin (KEFLEX) 500 MG capsule Take 1 capsule (500 mg total) by mouth 2 (two) times daily. 14 capsule Volney American, [place]      PDMP not reviewed this encounter.   Merrie Ro[place], [place] 06/28/21 [phone]

## 2021-06-29 ENCOUNTER — Encounter: Payer: Self-pay | Admitting: Family Medicine

## 2021-06-29 ENCOUNTER — Other Ambulatory Visit: Payer: Self-pay

## 2021-06-29 ENCOUNTER — Ambulatory Visit: Payer: Medicare PPO | Admitting: Family Medicine

## 2021-06-29 VITALS — BP 124/51 | HR 94 | Ht 67.0 in

## 2021-06-29 DIAGNOSIS — L89621 Pressure ulcer of left heel, stage 1: Secondary | ICD-10-CM | POA: Diagnosis not present

## 2021-06-29 DIAGNOSIS — L8990 Pressure ulcer of unspecified site, unspecified stage: Secondary | ICD-10-CM | POA: Insufficient documentation

## 2021-06-29 DIAGNOSIS — L989 Disorder of the skin and subcutaneous tissue, unspecified: Secondary | ICD-10-CM | POA: Diagnosis not present

## 2021-06-29 NOTE — Progress Notes (Signed)
   SUBJECTIVE:  CHIEF COMPLAINT / HPI:   Heel Lesion Patient presenting with circular lesion on the left posterior aspect of her heel.  Patient has been seen previously for this with urgent care and with her podiatrist.  She was started on antibiotics with urgent care and told to follow-up.  Patient reports that she has had pain with palpation in the area only.  No recent traumas.  She reports that she lies on her back at night with sleeping.  She has not had any fevers, chills, draining from the area.  She does report chronic leg swelling and has evidence of chronic venous dermatitis on physical exam.  She uses a power wheelchair for mobility.  She does get up out of her wheelchair to use the bathroom only.  PERTINENT  PMH / PSH: wheelchair dependent, type 1 diabetes, obesity, hypertension, HFpEF, venous insufficiency, A. fib on apixaban, mild cognitive impairment  OBJECTIVE:  BP (!) 124/51   Pulse 94   Ht 5\' 7"  (1.702 m)   SpO2 99%   BMI 36.02 kg/m   General: well appearing, NAD  Lower extremities: 1+ pitting edema in her distal legs.  No swelling in her feet.  2+ DPs bilaterally.  Well-circumscribed circular lesion on posterior aspect of left heel.  The top layer of skin is easily peeled and appears to have some hyperpigmentation underneath.  No drainage with palpation.  Does have moderate tenderness to palpation.  No surrounding erythema.  No obvious foul odor.      ASSESSMENT/PLAN:  Pressure ulcer On physical exam, seems most consistent with pressure ulcer, especially given location.  Patient also reports that she only sleeps on her back.  There is no involvement in her right heel.  She denies any recent traumas.  Patient does have physical exam findings consistent with chronic venous insufficiency, though the location of this lesion is less consistent with venous ulcer. -Elevate lower extremity -Avoid any pressure with this area -Return precautions provided -Follow-up to monitor in  2 weeks - [place], [place]

## 2021-06-29 NOTE — Patient Instructions (Signed)
Dear [name],   Today we discussed the following:   Heel injury   This may be a pressure ulcer. I have attached information about this to this packet For now, it is very important that you keep pressure off of this area.  At night, when you are lying down, please elevate this area so that it is not touching the bed. If you notice further pain, any drainage from the area, any worsening redness, any fevers or chills, please come in to be seen sooner. Follow-up in 2 weeks with Dr. Alba Cory for check-in.  For any labs obtained today, I will send results via [place].  If anything is abnormal, we will reach out to you for details on further management.   Be well,   Dr. Maudie Mercury  ===================================================================================

## 2021-06-29 NOTE — Assessment & Plan Note (Signed)
On physical exam, seems most consistent with pressure ulcer, especially given location.  Patient also reports that she only sleeps on her back.  There is no involvement in her right heel.  She denies any recent traumas.  Patient does have physical exam findings consistent with chronic venous insufficiency, though the location of this lesion is less consistent with venous ulcer. -Elevate lower extremity -Avoid any pressure with this area -Return precautions provided -Follow-up to monitor in 2 weeks - ABIs

## 2021-06-30 ENCOUNTER — Telehealth: Payer: Self-pay

## 2021-06-30 DIAGNOSIS — E1069 Type 1 diabetes mellitus with other specified complication: Secondary | ICD-10-CM | POA: Diagnosis not present

## 2021-06-30 DIAGNOSIS — M47812 Spondylosis without myelopathy or radiculopathy, cervical region: Secondary | ICD-10-CM | POA: Diagnosis not present

## 2021-06-30 DIAGNOSIS — M1990 Unspecified osteoarthritis, unspecified site: Secondary | ICD-10-CM | POA: Diagnosis not present

## 2021-06-30 DIAGNOSIS — M545 Low back pain, unspecified: Secondary | ICD-10-CM | POA: Diagnosis not present

## 2021-06-30 DIAGNOSIS — I69354 Hemiplegia and hemiparesis following cerebral infarction affecting left non-dominant side: Secondary | ICD-10-CM | POA: Diagnosis not present

## 2021-06-30 DIAGNOSIS — E890 Postprocedural hypothyroidism: Secondary | ICD-10-CM | POA: Diagnosis not present

## 2021-06-30 DIAGNOSIS — Z993 Dependence on wheelchair: Secondary | ICD-10-CM | POA: Diagnosis not present

## 2021-06-30 DIAGNOSIS — E782 Mixed hyperlipidemia: Secondary | ICD-10-CM | POA: Diagnosis not present

## 2021-06-30 DIAGNOSIS — Z950 Presence of cardiac pacemaker: Secondary | ICD-10-CM | POA: Diagnosis not present

## 2021-06-30 NOTE — Telephone Encounter (Signed)
2nd attempt to reach pt to inform of appt at [facility] on [facility] on 07/01/21 at 12pm. No answer. LVM on [phone] number. Salvatore Marvel, CMA

## 2021-07-01 ENCOUNTER — Other Ambulatory Visit: Payer: Self-pay

## 2021-07-01 ENCOUNTER — Ambulatory Visit (HOSPITAL_COMMUNITY)
Admission: RE | Admit: 2021-07-01 | Discharge: 2021-07-01 | Disposition: A | Discharge status: Home / Self Care | Payer: Medicare PPO | Source: Ambulatory Visit | Attending: Family Medicine | Admitting: Family Medicine

## 2021-07-01 DIAGNOSIS — L989 Disorder of the skin and subcutaneous tissue, unspecified: Secondary | ICD-10-CM | POA: Insufficient documentation

## 2021-07-01 NOTE — Telephone Encounter (Signed)
Spoke with patient to reminder her of her appt with vein [facility] today at 12pm. She stated that she did get my message about the appt. Salvatore Marvel, CMA

## 2021-07-03 ENCOUNTER — Encounter: Payer: Self-pay | Admitting: Family Medicine

## 2021-07-07 ENCOUNTER — Ambulatory Visit (INDEPENDENT_AMBULATORY_CARE_PROVIDER_SITE_OTHER): Payer: Medicare PPO

## 2021-07-07 DIAGNOSIS — I441 Atrioventricular block, second degree: Secondary | ICD-10-CM | POA: Diagnosis not present

## 2021-07-07 DIAGNOSIS — E890 Postprocedural hypothyroidism: Secondary | ICD-10-CM | POA: Diagnosis not present

## 2021-07-07 DIAGNOSIS — M1990 Unspecified osteoarthritis, unspecified site: Secondary | ICD-10-CM | POA: Diagnosis not present

## 2021-07-07 DIAGNOSIS — M545 Low back pain, unspecified: Secondary | ICD-10-CM | POA: Diagnosis not present

## 2021-07-07 DIAGNOSIS — Z993 Dependence on wheelchair: Secondary | ICD-10-CM | POA: Diagnosis not present

## 2021-07-07 DIAGNOSIS — M47812 Spondylosis without myelopathy or radiculopathy, cervical region: Secondary | ICD-10-CM | POA: Diagnosis not present

## 2021-07-07 DIAGNOSIS — E782 Mixed hyperlipidemia: Secondary | ICD-10-CM | POA: Diagnosis not present

## 2021-07-07 DIAGNOSIS — E1069 Type 1 diabetes mellitus with other specified complication: Secondary | ICD-10-CM | POA: Diagnosis not present

## 2021-07-07 DIAGNOSIS — Z950 Presence of cardiac pacemaker: Secondary | ICD-10-CM | POA: Diagnosis not present

## 2021-07-07 DIAGNOSIS — I69354 Hemiplegia and hemiparesis following cerebral infarction affecting left non-dominant side: Secondary | ICD-10-CM | POA: Diagnosis not present

## 2021-07-07 LAB — CUP PACEART REMOTE DEVICE CHECK
Battery Remaining Longevity: 126 mo
Battery Voltage: 3.03 V
Brady Statistic AP VP Percent: 42.91 %
Brady Statistic AP VS Percent: 6.77 %
Brady Statistic AS VP Percent: 31.79 %
Brady Statistic AS VS Percent: 18.53 %
Brady Statistic RA Percent Paced: 49.58 %
Brady Statistic RV Percent Paced: 74.7 %
Date Time Interrogation Session: 20220714043106
Implantable Lead Implant Date: 20210702
Implantable Lead Implant Date: 20210702
Implantable Lead Location: 753859
Implantable Lead Location: 753860
Implantable Lead Model: 5076
Implantable Lead Model: 5076
Implantable Pulse Generator Implant Date: 20210702
Lead Channel Impedance Value: 285 Ohm
Lead Channel Impedance Value: 361 Ohm
Lead Channel Impedance Value: 437 Ohm
Lead Channel Impedance Value: 475 Ohm
Lead Channel Pacing Threshold Amplitude: 0.5 V
Lead Channel Pacing Threshold Amplitude: 0.625 V
Lead Channel Pacing Threshold Pulse Width: 0.4 ms
Lead Channel Pacing Threshold Pulse Width: 0.4 ms
Lead Channel Sensing Intrinsic Amplitude: 10.125 mV
Lead Channel Sensing Intrinsic Amplitude: 10.125 mV
Lead Channel Sensing Intrinsic Amplitude: 4.75 mV
Lead Channel Sensing Intrinsic Amplitude: 4.75 mV
Lead Channel Setting Pacing Amplitude: 1.5 V
Lead Channel Setting Pacing Amplitude: 2.5 V
Lead Channel Setting Pacing Pulse Width: 0.4 ms
Lead Channel Setting Sensing Sensitivity: 1.2 mV

## 2021-07-12 ENCOUNTER — Ambulatory Visit: Payer: Medicare PPO | Admitting: Cardiology

## 2021-07-12 ENCOUNTER — Encounter: Payer: Self-pay | Admitting: Cardiology

## 2021-07-12 ENCOUNTER — Other Ambulatory Visit: Payer: Self-pay

## 2021-07-12 VITALS — BP 146/60 | HR 66 | Ht 67.0 in | Wt 240.0 lb

## 2021-07-12 DIAGNOSIS — I442 Atrioventricular block, complete: Secondary | ICD-10-CM | POA: Diagnosis not present

## 2021-07-12 NOTE — Progress Notes (Signed)
Electrophysiology Office Note   Date:  07/12/2021   ID:  [name] Meleni Delahunt, DOB 13-Mar-1951, MRN [id number]  PCP:  Eulis Foster, MD  Cardiologist:  Marlou Porch Primary Electrophysiologist:  Dr Curt Bears    CC: Follow up for cryptogenic stroke   History of Present Illness: [name] [name] is a 70 y.o. female who is being seen today for the evaluation of PACs at the request of [name], [facility]*. Presenting today for electrophysiology evaluation.    She has a history significant for hypertension, type 1 diabetes, hyperlipidemia, thyroid cancer status post thyroidectomy, prior CVA in 2019 in 2016, and GERD.  She had a Linq monitor implanted and was found to have intermittent episodes of complete heart block.  She is now status post Medtronic dual-chamber pacemaker implanted 06/25/2020.  Today, denies symptoms of palpitations, chest pain, shortness of breath, orthopnea, PND, lower extremity edema, claudication, dizziness, presyncope, syncope, bleeding, or neurologic sequela. The patient is tolerating medications without difficulties.  Since being seen she has done well.  She has no chest pain or shortness of breath.  Is able to all of her activities without restriction.  Past Medical History:  Diagnosis Date   Abnormal echocardiogram    Anemia    Arthritis    BPPV (benign paroxysmal positional vertigo), right 06/13/2017   Bradycardia 03/07/2020   Cerebrovascular accident (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 ([place]) 10/29/2015   CHB (complete heart block) ([place]) 03/08/2020   CHF (congestive heart failure) ([place])    Closed fracture of left proximal humerus 07/08/2019   Decreased range of motion of left shoulder 09/03/2020   Depression    Dizzy 03/07/2020   bradycardia   Edentulous 09/03/2020   GERD (gastroesophageal reflux disease)    Hemiparesis affecting left side as late effect of cerebrovascular accident (CVA) ([place]) 03/08/2020   Hemorrhoids    History of  Falls with injury 03/08/2020   Fall resulting in left humeral fracture   Hyperlipidemia    Hypertension    Hypothyroidism    Intracranial vascular stenosis    Near syncope 03/08/2020   Proteinuria 03/08/2020   Seasonal allergies    Stroke [facility])    Thyroid cancer ([place])    per pt S/p Total Thyroidectomy with Radioactive Iodine Therapy   Type 1 diabetes mellitus ([place])    Past Surgical History:  Procedure Laterality Date   ABDOMINAL ANGIOGRAM N/A 05/14/2012   Procedure: ABDOMINAL ANGIOGRAM;  Surgeon: Laverda Page, MD;  Location[facility] CATH LAB;  Service: Cardiovascular;  Laterality: N/A;   ABDOMINAL HYSTERECTOMY  1995   partial   ANKLE FRACTURE SURGERY Right    CARDIAC CATHETERIZATION     with coronary angiogram   cataract  Bilateral 2018   COLONOSCOPY N/A 09/09/2014   Procedure: COLONOSCOPY;  Surgeon: Gatha Mayer, MD;  Location: [place];  Service: Endoscopy;  Laterality: N/A;   ESOPHAGOGASTRODUODENOSCOPY (EGD) WITH PROPOFOL N/A 12/03/2018   Procedure: ESOPHAGOGASTRODUODENOSCOPY (EGD) WITH PROPOFOL;  Surgeon: Doran Stabler, MD;  Location: [place];  Service: Gastroenterology;  Laterality: N/A;   LEFT AND RIGHT HEART CATHETERIZATION WITH CORONARY ANGIOGRAM N/A 05/14/2012   Procedure: LEFT AND RIGHT HEART CATHETERIZATION WITH CORONARY ANGIOGRAM;  Surgeon: Laverda Page, MD;  Location: [facility] CATH LAB;  Service: Cardiovascular;  Laterality: N/A;   LOOP RECORDER INSERTION N/A 12/23/2018   Procedure: LOOP RECORDER INSERTION;  Surgeon: Constance Haw, MD;  Location: [place] CV LAB;  Service: Cardiovascular;  Laterality: N/A;   LOOP RECORDER REMOVAL  N/A 06/25/2020   Procedure: LOOP RECORDER REMOVAL;  Surgeon: Constance Haw, MD;  Location: [place] CV LAB;  Service: Cardiovascular;  Laterality: N/A;   MINI METER   09/08/2014   ELECTRONIC INSULIN PUMP   PACEMAKER IMPLANT N/A 06/25/2020   Procedure: PACEMAKER IMPLANT;  Surgeon: Constance Haw, MD;  Location:  [place] CV LAB;  Service: Cardiovascular;  Laterality: N/A;   TEE WITHOUT CARDIOVERSION N/A 10/16/2018   Procedure: TRANSESOPHAGEAL ECHOCARDIOGRAM (TEE);  Surgeon: Buford Dresser, MD;  Location: [facility] ENDOSCOPY;  Service: Cardiovascular;  Laterality: N/A;   THYROIDECTOMY  2006     Current Outpatient Medications  Medication Sig Dispense Refill   ACCU-CHEK GUIDE test strip      acetaminophen (TYLENOL) 500 MG tablet Take 1,000 mg by mouth every 8 (eight) hours as needed for mild pain.      amLODipine (NORVASC) 10 MG tablet Take 1 tablet (10 mg total) by mouth daily. 90 tablet 1   apixaban (ELIQUIS) 5 MG TABS tablet Take 1 tablet (5 mg total) by mouth 2 (two) times daily. 60 tablet 9   atorvastatin (LIPITOR) 80 MG tablet Take 1 tablet (80 mg total) by mouth every evening. 90 tablet 2   baclofen (LIORESAL) 10 MG tablet Take 0.5 tablets (5 mg total) by mouth 2 (two) times daily as needed for muscle spasms. 30 each 0   cephALEXin (KEFLEX) 500 MG capsule Take 1 capsule (500 mg total) by mouth 2 (two) times daily. 14 capsule 0   clotrimazole (LOTRIMIN) 1 % cream Apply 1 application topically 2 (two) times daily. To affect area of foot 90 each 0   Continuous Blood Gluc Transmit (DEXCOM G6 TRANSMITTER) MISC by Does not apply route. Checks her blood sugar     CRANBERRY EXTRACT PO Take 1 capsule by mouth daily.     fexofenadine (ALLEGRA) 180 MG tablet Take 180 mg by mouth daily as needed for allergies or rhinitis.     FLUoxetine (PROZAC) 10 MG capsule TAKE ONE CAPSULE BY MOUTH EVERY DAY WITH 20MG  DOSE 90 capsule 1   FLUoxetine (PROZAC) 20 MG tablet TAKE 1 TABLET BY MOUTH EVERY DAY ALONG WITH 10 MG FOR A TOTAL DOSE OF 30 MG EVERY DAY 90 tablet 2   gabapentin (NEURONTIN) 300 MG capsule Take 1 capsule (300 mg total) by mouth 3 (three) times daily. 270 capsule 2   insulin aspart (NOVOLOG) 100 UNIT/ML injection Inject 20-30 Units into the skin daily. To be used in patient's insulin pump 10 mL     Insulin Human (INSULIN PUMP) SOLN Inject 1 each into the skin continuous. Novolog insulin 1 each 0   levothyroxine (SYNTHROID) 200 MCG tablet Take 1 tablet (200 mcg total) by mouth daily before breakfast. 30 tablet 0   loperamide (IMODIUM A-D) 2 MG tablet Take 1 tablet (2 mg total) by mouth 4 (four) times daily as needed for diarrhea or loose stools. 30 tablet 2   losartan (COZAAR) 100 MG tablet Take 1 tablet (100 mg total) by mouth daily. 90 tablet 1   Multiple Vitamins-Minerals (OCUVITE PO) Take 1 tablet by mouth daily.     pantoprazole (PROTONIX) 40 MG tablet Take 1 tablet (40 mg total) by mouth 2 (two) times daily. 120 tablet 3   potassium chloride (KLOR-CON) 10 MEQ tablet Take 1 tablet (10 mEq total) by mouth daily. 90 tablet 1   torsemide (DEMADEX) 10 MG tablet TAKE 1 TABLET(10 MG) BY MOUTH DAILY AS NEEDED 90 tablet 0   No  current facility-administered medications for this visit.    Allergies:   Latex   Social History:  The patient  reports that she has never smoked. She has never used smokeless tobacco. She reports that she does not drink alcohol and does not use drugs.   Family History:  The patient's family history includes Diabetes in her maternal aunt; Early death in her father; Heart attack in her maternal grandfather and mother; Heart disease in her maternal aunt, maternal grandfather, and mother; Heart failure in her maternal grandfather; Hyperlipidemia in her father and mother; Hypertension in her father and mother; Prostate cancer in her paternal uncle; Renal Disease in her mother; Stroke in her son.   ROS:  Please see the history of present illness.   Otherwise, review of systems is positive for none.   All other systems are reviewed and negative.   PHYSICAL EXAM: VS:  BP (!) 146/60   Pulse 66   Ht 5\' 7"  (1.702 m)   Wt 240 lb (108.9 kg) Comment: per p;t in mobile rider, stated "she can't stand up"  SpO2 96%   BMI 37.59 kg/m  , BMI Body mass index is 37.59 kg/m. GEN: Well  nourished, well developed, in no acute distress  HEENT: normal  Neck: no JVD, carotid bruits, or masses Cardiac:.  RRR; no murmurs, rubs, or gallops,no edema  Respiratory:  clear to auscultation bilaterally, normal work of breathing GI: soft, nontender, nondistended, + BS MS: no deformity or atrophy  Skin: warm and dry, device site well healed Neuro:  Strength and sensation are intact Psych: euthymic mood, full affect  EKG:  EKG is ordered today. Personal review of the ekg ordered shows sinus rhythm, ventricular paced  Personal review of the device interrogation today. Results in [place]: 06/06/2021: BUN 17; Creatinine, Ser 0.91; Potassium 4.0; Sodium 139; TSH 5.110    Lipid Panel     Component Value Date/Time   CHOL 112 06/06/2021 1127   TRIG 58 06/06/2021 1127   HDL 50 06/06/2021 1127   CHOLHDL 2.2 06/06/2021 1127   CHOLHDL 2.2 10/14/2018 0439   VLDL 11 10/14/2018 0439   LDLCALC 49 06/06/2021 1127   LDLDIRECT 75 01/25/2012 1508     Wt Readings from Last 3 Encounters:  07/12/21 240 lb (108.9 kg)  02/15/21 230 lb (104.3 kg)  10/07/20 231 lb (104.8 kg)      Other studies Reviewed: Additional studies/ records that were reviewed today include: TTE 10/14/18  Review of the above records today demonstrates:  - Left ventricle: The cavity size was normal. There was moderate   concentric hypertrophy. Systolic function was normal. The   estimated ejection fraction was in the range of 60% to 65%. Wall   motion was normal; there were no regional wall motion   abnormalities. The study is not technically sufficient to allow   evaluation of LV diastolic function. - Left atrium: The atrium was normal in size. - Right atrium: The atrium was normal in size.  Cardiac monitor 10/28/18 - personally reviewed PAC's, 1 episodes NSVT 5 beats, no atrial fibrillation  ASSESSMENT AND PLAN:  1.  Cryptogenic stroke: Strokes in 2016 and 2019.  No atrial fibrillation  noted.  2.  Hypertension: Currently well controlled  3.  Hyperlipidemia: Continue statin  4.  Complete heart block: Status post Saint Jude dual-chamber pacemaker implanted 06/25/2020.  Device functioning appropriately.  No changes at this time.    Current medicines are reviewed at length with the  patient today.   The patient does not have concerns regarding her medicines.  The following changes were made today: none  Labs/ tests ordered today include:  Orders Placed This Encounter  Procedures   EKG 12-Lead     Disposition: 12 months  Signed, Haillee Johann Meredith Leeds, MD  07/12/2021 2:57 PM     [address] [place] [address] [phone] (office) [phone] (fax)

## 2021-07-12 NOTE — Patient Instructions (Addendum)
Medication Instructions:  Your physician recommends that you continue on your current medications as directed. Please refer to the Current Medication list given to you today.  *If you need a refill on your cardiac medications before your next appointment, please call your pharmacy*   Lab Work: None ordered    Testing/Procedures: None ordered   Follow-Up: At [facility], you and your health needs are our priority.  As part of our continuing mission to provide you with exceptional heart care, we have created designated Provider Care Teams.  These Care Teams include your primary Cardiologist (physician) and Advanced Practice Providers (APPs -  Physician Assistants and Nurse Practitioners) who all work together to provide you with the care you need, when you need it.  Remote monitoring is used to monitor your Pacemaker or ICD from home. This monitoring reduces the number of office visits required to check your device to one time per year. It allows [place] to keep an eye on the functioning of your device to ensure it is working properly. You are scheduled for a device check from home on 10/07/21. You may send your transmission at any time that day. If you have a wireless device, the transmission will be sent automatically. After your physician reviews your transmission, you will receive a postcard with your next transmission date.  Your next appointment:   1 year(s)  The format for your next appointment:   In Person  Provider:   Allegra Lai, MD   Thank you for choosing [place] [facility]!!   Trinidad Curet, RN [phone]

## 2021-07-13 ENCOUNTER — Other Ambulatory Visit: Payer: Self-pay | Admitting: Family Medicine

## 2021-07-13 ENCOUNTER — Other Ambulatory Visit: Payer: Self-pay | Admitting: Student in an Organized Health Care Education/Training Program

## 2021-07-13 DIAGNOSIS — H3582 Retinal ischemia: Secondary | ICD-10-CM | POA: Diagnosis not present

## 2021-07-13 DIAGNOSIS — H43813 Vitreous degeneration, bilateral: Secondary | ICD-10-CM | POA: Diagnosis not present

## 2021-07-13 DIAGNOSIS — E103513 Type 1 diabetes mellitus with proliferative diabetic retinopathy with macular edema, bilateral: Secondary | ICD-10-CM | POA: Diagnosis not present

## 2021-07-14 ENCOUNTER — Other Ambulatory Visit: Payer: Self-pay | Admitting: Family Medicine

## 2021-07-14 DIAGNOSIS — E1065 Type 1 diabetes mellitus with hyperglycemia: Secondary | ICD-10-CM | POA: Diagnosis not present

## 2021-07-14 DIAGNOSIS — Z950 Presence of cardiac pacemaker: Secondary | ICD-10-CM | POA: Diagnosis not present

## 2021-07-14 DIAGNOSIS — M545 Low back pain, unspecified: Secondary | ICD-10-CM | POA: Diagnosis not present

## 2021-07-14 DIAGNOSIS — E890 Postprocedural hypothyroidism: Secondary | ICD-10-CM | POA: Diagnosis not present

## 2021-07-14 DIAGNOSIS — M1990 Unspecified osteoarthritis, unspecified site: Secondary | ICD-10-CM | POA: Diagnosis not present

## 2021-07-14 DIAGNOSIS — E1069 Type 1 diabetes mellitus with other specified complication: Secondary | ICD-10-CM | POA: Diagnosis not present

## 2021-07-14 DIAGNOSIS — I69354 Hemiplegia and hemiparesis following cerebral infarction affecting left non-dominant side: Secondary | ICD-10-CM | POA: Diagnosis not present

## 2021-07-14 DIAGNOSIS — M47812 Spondylosis without myelopathy or radiculopathy, cervical region: Secondary | ICD-10-CM | POA: Diagnosis not present

## 2021-07-14 DIAGNOSIS — E782 Mixed hyperlipidemia: Secondary | ICD-10-CM | POA: Diagnosis not present

## 2021-07-14 DIAGNOSIS — Z993 Dependence on wheelchair: Secondary | ICD-10-CM | POA: Diagnosis not present

## 2021-07-19 NOTE — Progress Notes (Signed)
    SUBJECTIVE:   CHIEF COMPLAINT / HPI: mobility assessment, foot wound, hypothyroidism  Left heel wound Patient states that she had a wound on the posterior aspect of her left foot. She has seen podiatry for this in the past and been treated. She reports that her home health RN noticed it and recommended that patient seek PCP follow up. Patient reports having minimal pain in the area of the ulcer. She reports that she is not able to see the ulcer due to its position. She denies any bleeding or purulent drainage.   Mobility assessment  Patient presents for wheel chair re-certification. She reports that she uses the electric wheelchair daily in her home to get around upstairs. She states that without the chair, she would have frequent falls in the past. Patient lives in two story home and needs some assistance with a few of her ADLS. She frequently uses the wheelchair to help with getting to the restroom. Pt has hx of left sided weakness following CVA and prior to use of wheelchair was noted to have multiple falls in the home.   Hypothyroidism Patient has hx of hypothyroidism that is surgically induced. She reports that she follows with endocrinology usually every 3 months and is next scheduled to follow up in Sept. She states that she was taking 136mg of synthroid but was recently prescribed 202m so she has been taking the 20026[place]stead. Patient is agreeable to checking TSH in clinic today.   PERTINENT  PMH / PSH: T1DM   OBJECTIVE:   BP 125/75   Pulse 77   SpO2 94%   General: female appearing stated age in no acute distress sitting in wheelchair  Cardio: Normal S1 and S2, no S3 or S4. Rhythm is regular. No murmurs or rubs.  Bilateral radial pulses palpable Pulm: Clear to auscultation bilaterally, no crackles, wheezing, or diminished breath sounds. Normal respiratory effort, stable on RA Abdomen: Bowel sounds normal. Abdomen soft and non-tender.  Extremities: scant peripheral edema.  Warm/ well perfused.  Neuro: sensation intact in bilateral Lower extremities, patient able to demonstrate normal ROM of LE in seated position, decreased LLE strength   Media Information         Document Information  Photos    07/20/2021 10:57  Attached To:  Office Visit on 07/20/21 with SimEulis FosterD   Source Information  SimEulis FosterD  [facility] Resident   ASSESSMENT/PLAN:   Wound of left foot Patient presents with healing left foot wound. No erythema or tenderness. Eschar present.  Img taken and saved in media tab  No abx indicated at this time.   Unable to perform bed mobility without assistance Pt evaluated for necessity of electric wheelchair to asst with ADLs around the home.  - patient continues to have LE weakness, primarily on left side 2/2 to CVA, she continues to require assistance with mobility using the electric wheelchair on a daily basis  Recommend continued usage to prevent more falls in the home   Hypothyroidism, postsurgical TSH  Will adjust synthroid dose based on results  Pt to follow up with endocrinology next month      MakEulis FosterD [place]

## 2021-07-20 ENCOUNTER — Encounter: Payer: Self-pay | Admitting: Family Medicine

## 2021-07-20 ENCOUNTER — Other Ambulatory Visit: Payer: Self-pay

## 2021-07-20 ENCOUNTER — Ambulatory Visit: Payer: Medicare PPO | Admitting: Family Medicine

## 2021-07-20 VITALS — BP 125/75 | HR 77

## 2021-07-20 DIAGNOSIS — S91302A Unspecified open wound, left foot, initial encounter: Secondary | ICD-10-CM

## 2021-07-20 DIAGNOSIS — R2689 Other abnormalities of gait and mobility: Secondary | ICD-10-CM

## 2021-07-20 DIAGNOSIS — E890 Postprocedural hypothyroidism: Secondary | ICD-10-CM

## 2021-07-20 MED ORDER — LEVOTHYROXINE SODIUM 200 MCG PO TABS: 200.0000 ug | ORAL_TABLET | Freq: Every day | ORAL | 0 refills | Status: DC

## 2021-07-20 NOTE — Patient Instructions (Signed)
It was a pleasure to see you today!  Thank you for choosing [facility] for your primary care.   [name] was seen for wheelchair certification and left foot wound.   Our plans for today were: It appears that her left foot wound is healing well.  I recommend that she continue to have your home health nurse to check this and if it begins to drain foul-smelling fluid or becomes painful or you notice redness, please notify us if that is possible as these are signs of infection. I will check your thyroid studies today to see if we need to change her medication  regimen. Recommended she follow-up with your endocrinologist as soon as possible given that your hemoglobin A1c was very elevated.  To keep you healthy, please keep in mind the following health maintenance items that you are due for:   Bone density scan Shingles vaccine Diabetes eye exam   You should return to our clinic in 1 month for foot wound.   Best Wishes,   Dr. Alba Cory

## 2021-07-21 ENCOUNTER — Other Ambulatory Visit: Payer: Self-pay | Admitting: Family Medicine

## 2021-07-21 DIAGNOSIS — Z993 Dependence on wheelchair: Secondary | ICD-10-CM | POA: Diagnosis not present

## 2021-07-21 DIAGNOSIS — Z950 Presence of cardiac pacemaker: Secondary | ICD-10-CM | POA: Diagnosis not present

## 2021-07-21 DIAGNOSIS — M545 Low back pain, unspecified: Secondary | ICD-10-CM | POA: Diagnosis not present

## 2021-07-21 DIAGNOSIS — M47812 Spondylosis without myelopathy or radiculopathy, cervical region: Secondary | ICD-10-CM | POA: Diagnosis not present

## 2021-07-21 DIAGNOSIS — E1069 Type 1 diabetes mellitus with other specified complication: Secondary | ICD-10-CM | POA: Diagnosis not present

## 2021-07-21 DIAGNOSIS — E890 Postprocedural hypothyroidism: Secondary | ICD-10-CM | POA: Diagnosis not present

## 2021-07-21 DIAGNOSIS — I69354 Hemiplegia and hemiparesis following cerebral infarction affecting left non-dominant side: Secondary | ICD-10-CM | POA: Diagnosis not present

## 2021-07-21 DIAGNOSIS — M1990 Unspecified osteoarthritis, unspecified site: Secondary | ICD-10-CM | POA: Diagnosis not present

## 2021-07-21 DIAGNOSIS — E782 Mixed hyperlipidemia: Secondary | ICD-10-CM | POA: Diagnosis not present

## 2021-07-21 LAB — TSH: TSH: 0.209 u[IU]/mL — ABNORMAL LOW (ref 0.450–4.500)

## 2021-07-26 DIAGNOSIS — R2689 Other abnormalities of gait and mobility: Secondary | ICD-10-CM | POA: Insufficient documentation

## 2021-07-26 DIAGNOSIS — S91302A Unspecified open wound, left foot, initial encounter: Secondary | ICD-10-CM | POA: Insufficient documentation

## 2021-07-26 NOTE — Assessment & Plan Note (Signed)
TSH  Will adjust synthroid dose based on results  Pt to follow up with endocrinology next month

## 2021-07-26 NOTE — Assessment & Plan Note (Addendum)
Pt evaluated for necessity of electric wheelchair to asst with ADLs around the home.  - patient continues to have LE weakness, primarily on left side 2/2 to CVA, she continues to require assistance with mobility using the electric wheelchair on a daily basis  Recommend continued usage to prevent more falls in the home

## 2021-07-26 NOTE — Assessment & Plan Note (Signed)
Patient presents with healing left foot wound. No erythema or tenderness. Eschar present.  Img taken and saved in media tab  No abx indicated at this time.

## 2021-07-28 DIAGNOSIS — Z993 Dependence on wheelchair: Secondary | ICD-10-CM | POA: Diagnosis not present

## 2021-07-28 DIAGNOSIS — M47812 Spondylosis without myelopathy or radiculopathy, cervical region: Secondary | ICD-10-CM | POA: Diagnosis not present

## 2021-07-28 DIAGNOSIS — M1990 Unspecified osteoarthritis, unspecified site: Secondary | ICD-10-CM | POA: Diagnosis not present

## 2021-07-28 DIAGNOSIS — I69354 Hemiplegia and hemiparesis following cerebral infarction affecting left non-dominant side: Secondary | ICD-10-CM | POA: Diagnosis not present

## 2021-07-28 DIAGNOSIS — Z950 Presence of cardiac pacemaker: Secondary | ICD-10-CM | POA: Diagnosis not present

## 2021-07-28 DIAGNOSIS — M545 Low back pain, unspecified: Secondary | ICD-10-CM | POA: Diagnosis not present

## 2021-07-28 DIAGNOSIS — E782 Mixed hyperlipidemia: Secondary | ICD-10-CM | POA: Diagnosis not present

## 2021-07-28 DIAGNOSIS — E890 Postprocedural hypothyroidism: Secondary | ICD-10-CM | POA: Diagnosis not present

## 2021-07-28 DIAGNOSIS — E1069 Type 1 diabetes mellitus with other specified complication: Secondary | ICD-10-CM | POA: Diagnosis not present

## 2021-08-01 NOTE — Progress Notes (Signed)
Remote pacemaker transmission.   

## 2021-08-04 ENCOUNTER — Encounter (HOSPITAL_COMMUNITY): Payer: Self-pay | Admitting: Emergency Medicine

## 2021-08-04 ENCOUNTER — Inpatient Hospital Stay (HOSPITAL_COMMUNITY): Payer: Medicare PPO

## 2021-08-04 ENCOUNTER — Other Ambulatory Visit: Payer: Self-pay

## 2021-08-04 ENCOUNTER — Emergency Department (HOSPITAL_COMMUNITY): Payer: Medicare PPO

## 2021-08-04 ENCOUNTER — Telehealth: Payer: Self-pay

## 2021-08-04 ENCOUNTER — Inpatient Hospital Stay (HOSPITAL_COMMUNITY)
Admission: EM | Admit: 2021-08-04 | Discharge: 2021-08-10 | DRG: 065 | Disposition: A | Discharge status: Rehabilitation Facility | Payer: Medicare PPO | Attending: Family Medicine | Admitting: Family Medicine

## 2021-08-04 DIAGNOSIS — Z950 Presence of cardiac pacemaker: Secondary | ICD-10-CM

## 2021-08-04 DIAGNOSIS — Z90710 Acquired absence of both cervix and uterus: Secondary | ICD-10-CM

## 2021-08-04 DIAGNOSIS — N179 Acute kidney failure, unspecified: Secondary | ICD-10-CM | POA: Diagnosis not present

## 2021-08-04 DIAGNOSIS — R339 Retention of urine, unspecified: Secondary | ICD-10-CM | POA: Diagnosis not present

## 2021-08-04 DIAGNOSIS — I10 Essential (primary) hypertension: Secondary | ICD-10-CM | POA: Diagnosis not present

## 2021-08-04 DIAGNOSIS — E1069 Type 1 diabetes mellitus with other specified complication: Secondary | ICD-10-CM | POA: Diagnosis not present

## 2021-08-04 DIAGNOSIS — I69354 Hemiplegia and hemiparesis following cerebral infarction affecting left non-dominant side: Secondary | ICD-10-CM

## 2021-08-04 DIAGNOSIS — Z809 Family history of malignant neoplasm, unspecified: Secondary | ICD-10-CM

## 2021-08-04 DIAGNOSIS — Z9641 Presence of insulin pump (external) (internal): Secondary | ICD-10-CM | POA: Diagnosis not present

## 2021-08-04 DIAGNOSIS — F40240 Claustrophobia: Secondary | ICD-10-CM | POA: Diagnosis present

## 2021-08-04 DIAGNOSIS — Z83438 Family history of other disorder of lipoprotein metabolism and other lipidemia: Secondary | ICD-10-CM

## 2021-08-04 DIAGNOSIS — I5032 Chronic diastolic (congestive) heart failure: Secondary | ICD-10-CM

## 2021-08-04 DIAGNOSIS — G4733 Obstructive sleep apnea (adult) (pediatric): Secondary | ICD-10-CM | POA: Diagnosis present

## 2021-08-04 DIAGNOSIS — G8194 Hemiplegia, unspecified affecting left nondominant side: Secondary | ICD-10-CM | POA: Diagnosis not present

## 2021-08-04 DIAGNOSIS — R531 Weakness: Secondary | ICD-10-CM | POA: Diagnosis not present

## 2021-08-04 DIAGNOSIS — E782 Mixed hyperlipidemia: Secondary | ICD-10-CM | POA: Diagnosis present

## 2021-08-04 DIAGNOSIS — Z7901 Long term (current) use of anticoagulants: Secondary | ICD-10-CM

## 2021-08-04 DIAGNOSIS — Z823 Family history of stroke: Secondary | ICD-10-CM

## 2021-08-04 DIAGNOSIS — E1059 Type 1 diabetes mellitus with other circulatory complications: Secondary | ICD-10-CM | POA: Diagnosis not present

## 2021-08-04 DIAGNOSIS — E1042 Type 1 diabetes mellitus with diabetic polyneuropathy: Secondary | ICD-10-CM | POA: Diagnosis not present

## 2021-08-04 DIAGNOSIS — I69322 Dysarthria following cerebral infarction: Secondary | ICD-10-CM | POA: Diagnosis not present

## 2021-08-04 DIAGNOSIS — I442 Atrioventricular block, complete: Secondary | ICD-10-CM | POA: Diagnosis present

## 2021-08-04 DIAGNOSIS — E669 Obesity, unspecified: Secondary | ICD-10-CM | POA: Diagnosis not present

## 2021-08-04 DIAGNOSIS — F32A Depression, unspecified: Secondary | ICD-10-CM | POA: Diagnosis present

## 2021-08-04 DIAGNOSIS — E1169 Type 2 diabetes mellitus with other specified complication: Secondary | ICD-10-CM | POA: Diagnosis not present

## 2021-08-04 DIAGNOSIS — Z794 Long term (current) use of insulin: Secondary | ICD-10-CM

## 2021-08-04 DIAGNOSIS — Z91040 Latex allergy status: Secondary | ICD-10-CM

## 2021-08-04 DIAGNOSIS — G8929 Other chronic pain: Secondary | ICD-10-CM | POA: Diagnosis present

## 2021-08-04 DIAGNOSIS — L89620 Pressure ulcer of left heel, unstageable: Secondary | ICD-10-CM | POA: Diagnosis not present

## 2021-08-04 DIAGNOSIS — Z6841 Body Mass Index (BMI) 40.0 and over, adult: Secondary | ICD-10-CM

## 2021-08-04 DIAGNOSIS — R4781 Slurred speech: Secondary | ICD-10-CM | POA: Diagnosis present

## 2021-08-04 DIAGNOSIS — E10649 Type 1 diabetes mellitus with hypoglycemia without coma: Secondary | ICD-10-CM | POA: Diagnosis not present

## 2021-08-04 DIAGNOSIS — Z833 Family history of diabetes mellitus: Secondary | ICD-10-CM

## 2021-08-04 DIAGNOSIS — E1159 Type 2 diabetes mellitus with other circulatory complications: Secondary | ICD-10-CM | POA: Diagnosis not present

## 2021-08-04 DIAGNOSIS — E890 Postprocedural hypothyroidism: Secondary | ICD-10-CM | POA: Diagnosis not present

## 2021-08-04 DIAGNOSIS — Z79899 Other long term (current) drug therapy: Secondary | ICD-10-CM

## 2021-08-04 DIAGNOSIS — E1036 Type 1 diabetes mellitus with diabetic cataract: Secondary | ICD-10-CM | POA: Diagnosis not present

## 2021-08-04 DIAGNOSIS — R2689 Other abnormalities of gait and mobility: Secondary | ICD-10-CM | POA: Diagnosis present

## 2021-08-04 DIAGNOSIS — S80821A Blister (nonthermal), right lower leg, initial encounter: Secondary | ICD-10-CM | POA: Diagnosis present

## 2021-08-04 DIAGNOSIS — E66813 Obesity, class 3: Secondary | ICD-10-CM | POA: Diagnosis present

## 2021-08-04 DIAGNOSIS — Z8249 Family history of ischemic heart disease and other diseases of the circulatory system: Secondary | ICD-10-CM

## 2021-08-04 DIAGNOSIS — M545 Low back pain, unspecified: Secondary | ICD-10-CM | POA: Diagnosis present

## 2021-08-04 DIAGNOSIS — Z841 Family history of disorders of kidney and ureter: Secondary | ICD-10-CM

## 2021-08-04 DIAGNOSIS — I672 Cerebral atherosclerosis: Secondary | ICD-10-CM | POA: Diagnosis present

## 2021-08-04 DIAGNOSIS — I480 Paroxysmal atrial fibrillation: Secondary | ICD-10-CM | POA: Diagnosis not present

## 2021-08-04 DIAGNOSIS — R0602 Shortness of breath: Secondary | ICD-10-CM | POA: Diagnosis not present

## 2021-08-04 DIAGNOSIS — Z993 Dependence on wheelchair: Secondary | ICD-10-CM

## 2021-08-04 DIAGNOSIS — I6381 Other cerebral infarction due to occlusion or stenosis of small artery: Secondary | ICD-10-CM | POA: Diagnosis not present

## 2021-08-04 DIAGNOSIS — K5900 Constipation, unspecified: Secondary | ICD-10-CM | POA: Diagnosis not present

## 2021-08-04 DIAGNOSIS — I152 Hypertension secondary to endocrine disorders: Secondary | ICD-10-CM | POA: Diagnosis not present

## 2021-08-04 DIAGNOSIS — I517 Cardiomegaly: Secondary | ICD-10-CM | POA: Diagnosis not present

## 2021-08-04 DIAGNOSIS — R29705 NIHSS score 5: Secondary | ICD-10-CM | POA: Diagnosis present

## 2021-08-04 DIAGNOSIS — D72819 Decreased white blood cell count, unspecified: Secondary | ICD-10-CM | POA: Diagnosis not present

## 2021-08-04 DIAGNOSIS — E1142 Type 2 diabetes mellitus with diabetic polyneuropathy: Secondary | ICD-10-CM | POA: Diagnosis not present

## 2021-08-04 DIAGNOSIS — M47812 Spondylosis without myelopathy or radiculopathy, cervical region: Secondary | ICD-10-CM | POA: Diagnosis not present

## 2021-08-04 DIAGNOSIS — R0609 Other forms of dyspnea: Secondary | ICD-10-CM | POA: Diagnosis not present

## 2021-08-04 DIAGNOSIS — I110 Hypertensive heart disease with heart failure: Secondary | ICD-10-CM | POA: Diagnosis not present

## 2021-08-04 DIAGNOSIS — R0600 Dyspnea, unspecified: Secondary | ICD-10-CM | POA: Diagnosis not present

## 2021-08-04 DIAGNOSIS — Z20822 Contact with and (suspected) exposure to covid-19: Secondary | ICD-10-CM | POA: Diagnosis not present

## 2021-08-04 DIAGNOSIS — I639 Cerebral infarction, unspecified: Secondary | ICD-10-CM | POA: Diagnosis present

## 2021-08-04 DIAGNOSIS — I4891 Unspecified atrial fibrillation: Secondary | ICD-10-CM | POA: Diagnosis not present

## 2021-08-04 DIAGNOSIS — K219 Gastro-esophageal reflux disease without esophagitis: Secondary | ICD-10-CM | POA: Diagnosis present

## 2021-08-04 DIAGNOSIS — Z85850 Personal history of malignant neoplasm of thyroid: Secondary | ICD-10-CM

## 2021-08-04 DIAGNOSIS — M1990 Unspecified osteoarthritis, unspecified site: Secondary | ICD-10-CM | POA: Diagnosis not present

## 2021-08-04 DIAGNOSIS — J9811 Atelectasis: Secondary | ICD-10-CM | POA: Diagnosis not present

## 2021-08-04 DIAGNOSIS — Z9181 History of falling: Secondary | ICD-10-CM

## 2021-08-04 DIAGNOSIS — G3184 Mild cognitive impairment, so stated: Secondary | ICD-10-CM | POA: Diagnosis present

## 2021-08-04 HISTORY — DX: Other cerebral infarction due to occlusion or stenosis of small artery: I63.81

## 2021-08-04 HISTORY — DX: Presence of cardiac pacemaker: Z95.0

## 2021-08-04 HISTORY — DX: Paroxysmal atrial fibrillation: I48.0

## 2021-08-04 LAB — DIFFERENTIAL
Abs Immature Granulocytes: 0.01 10*3/uL (ref 0.00–0.07)
Basophils Absolute: 0 10*3/uL (ref 0.0–0.1)
Basophils Relative: 1 %
Eosinophils Absolute: 0.1 10*3/uL (ref 0.0–0.5)
Eosinophils Relative: 2 %
Immature Granulocytes: 0 %
Lymphocytes Relative: 32 %
Lymphs Abs: 1.5 10*3/uL (ref 0.7–4.0)
Monocytes Absolute: 0.6 10*3/uL (ref 0.1–1.0)
Monocytes Relative: 14 %
Neutro Abs: 2.3 10*3/uL (ref 1.7–7.7)
Neutrophils Relative %: 51 %

## 2021-08-04 LAB — APTT: aPTT: 33 seconds (ref 24–36)

## 2021-08-04 LAB — COMPREHENSIVE METABOLIC PANEL
ALT: 24 U/L (ref 0–44)
AST: 27 U/L (ref 15–41)
Albumin: 3.2 g/dL — ABNORMAL LOW (ref 3.5–5.0)
Alkaline Phosphatase: 91 U/L (ref 38–126)
Anion gap: 8 (ref 5–15)
BUN: 20 mg/dL (ref 8–23)
CO2: 26 mmol/L (ref 22–32)
Calcium: 9 mg/dL (ref 8.9–10.3)
Chloride: 106 mmol/L (ref 98–111)
Creatinine, Ser: 1.28 mg/dL — ABNORMAL HIGH (ref 0.44–1.00)
GFR, Estimated: 45 mL/min — ABNORMAL LOW (ref >60)
Glucose, Bld: 152 mg/dL — ABNORMAL HIGH (ref 70–99)
Potassium: 3.8 mmol/L (ref 3.5–5.1)
Sodium: 140 mmol/L (ref 135–145)
Total Bilirubin: 0.8 mg/dL (ref 0.3–1.2)
Total Protein: 6.7 g/dL (ref 6.5–8.1)

## 2021-08-04 LAB — CBC
HCT: 36 % (ref 36.0–46.0)
Hemoglobin: 11.3 g/dL — ABNORMAL LOW (ref 12.0–15.0)
MCH: 29 pg (ref 26.0–34.0)
MCHC: 31.4 g/dL (ref 30.0–36.0)
MCV: 92.3 fL (ref 80.0–100.0)
Platelets: 173 10*3/uL (ref 150–400)
RBC: 3.9 MIL/uL (ref 3.87–5.11)
RDW: 12.9 % (ref 11.5–15.5)
WBC: 4.5 10*3/uL (ref 4.0–10.5)
nRBC: 0 % (ref 0.0–0.2)

## 2021-08-04 LAB — RESP PANEL BY RT-PCR (FLU A&B, COVID) ARPGX2
Influenza A by PCR: NEGATIVE
Influenza B by PCR: NEGATIVE
SARS Coronavirus 2 by RT PCR: NEGATIVE

## 2021-08-04 LAB — PROTIME-INR
INR: 1.3 — ABNORMAL HIGH (ref 0.8–1.2)
Prothrombin Time: 16.6 seconds — ABNORMAL HIGH (ref 11.4–15.2)

## 2021-08-04 MED ORDER — GABAPENTIN 100 MG PO CAPS
100.0000 mg | ORAL_CAPSULE | Freq: Three times a day (TID) | ORAL | Status: DC
  Administered 2021-08-04 – 2021-08-10 (×16): 100 mg via ORAL
  Filled 2021-08-04 (×17): qty 1

## 2021-08-04 MED ORDER — APIXABAN 5 MG PO TABS
5.0000 mg | ORAL_TABLET | Freq: Two times a day (BID) | ORAL | Status: DC
  Administered 2021-08-04 – 2021-08-10 (×11): 5 mg via ORAL
  Filled 2021-08-04 (×12): qty 1

## 2021-08-04 MED ORDER — PANTOPRAZOLE SODIUM 40 MG PO TBEC
40.0000 mg | DELAYED_RELEASE_TABLET | Freq: Two times a day (BID) | ORAL | Status: DC
  Administered 2021-08-04 – 2021-08-10 (×8): 40 mg via ORAL
  Filled 2021-08-04 (×12): qty 1

## 2021-08-04 MED ORDER — INSULIN ASPART 100 UNIT/ML IJ SOLN: 0.0000 [IU] | Freq: Three times a day (TID) | INTRAMUSCULAR | Status: DC

## 2021-08-04 MED ORDER — ATORVASTATIN CALCIUM 80 MG PO TABS
80.0000 mg | ORAL_TABLET | Freq: Every evening | ORAL | Status: DC
  Administered 2021-08-06 – 2021-08-09 (×4): 80 mg via ORAL
  Filled 2021-08-04 (×4): qty 1

## 2021-08-04 NOTE — H&P (Addendum)
[name] Admission History and Physical Service Pager: [phone]  Patient name: [name] Medical record number: [id number] Date of birth: 1951-02-14 Age: 70 y.o. Gender: female  Primary Care Provider: Eulis Foster, MD Consultants: Neurology Code Status: Full Preferred Emergency Contact: [name] (sister) [phone]. Patient states [name] (Niece) is healthcare POA but documentation not stated   Chief Complaint: Left-sided weakness  Assessment and Plan: [name] is a 70 y.o. female presenting with left sided weakness since yesterday evening . PMH is significant for Type 1 DM, h/o CVA, a fib (on Eliquis) s/p placemaker 06/2020, HTN, HLD, morbid obesity, OSA, HFpEF, GERD, thyroid cancer s/p total thyroidectomy.  Left Sided Weakness, rule out CVA Arrived to ED hemodynamically stable with slight slurring of speech, although she is edentulous, and unable to walk secondary to the weakness.  Has residual left-sided motor deficits from prior CVA.  Uses electric wheelchair, able to ambulate short distances from the bedroom to the bathroom.  CT head impression revealed no acute intracranial abnormalities or hemorrhage. Remainder of workup was unremarkable for metabolic abnormalities. Blood glucose ranged from 98-160. Labs in ED remarkable for: PT 16.6, PTT 33, INR 1.3, WBC 4.5 Hgb 11.3, Cr 1.38, Glucose 152. TSH 0.209.  Respiratory pathogen panel negative. 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 - Admit to med-tele, Dr. Ardelia Mems attending - q1h neuro checks - Neurology consulted, appreciate recommendations - MRI brain with and without contrast in a.m. - A.m. CBC - CK - Gabapentin 100 mg 3 times daily - UDS pending - PT OT  Dyspnea Worsening shortness of breath for the past 6 weeks, per patient.  Leaning forward in the bed, patient experiences significant amount  of dyspnea out of proportion to amount of activity.  No tachypnea at rest.  Wells score 1.5, low risk. No significant concern at this time for PE.  This is more likely due to ongoing heart failure issues, immobility.  She does not appear significantly volume overloaded, but does have some 1+ bilateral pitting edema.  BNP 130.6.  Chest x-ray impression revealed hazy opacity overlying the left lung, but no overt pulmonary edema. 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  On Eliquis 5 mg twice daily. - Monitor vital signs - Continue Eliquis  Chronic, stable  HFpEF Last echocardiogram in 02/2020 revealed LVEF 60-65%, normal LV wall function, no wall motion abnormalities.  Euovolemic on exam - Echocardiogram - Hold Torsemide '10mg'$    HTN BP 123/90 on arrival, with a few isolated elevated readings of 160s/60s but have been lower throughout the night ranging from 100-120s/40-50s. Considered fluid resuscitation, but given heart failure history, SOB with leaning forward in bed, and CXR, decided to hold off for now. - Allow permissive hypertension if blood pressure elevates, per neuro - Monitor blood pressure  Type 1 DM A1c 10.3% (increased from 8.6 in 2021).  Blood glucose ranging from 98-160. Home insulin pumpdue to be changed today. - CBG 4 times daily, before meals and at bedtime - SSI - Remove insulin pump, will require basal while in hospital - Diabetic educator consult  AKI Cr elevated to 1.28. BUN 20, GFR 45. Mg 1.8. Baseline Cr 0.7-1.00. - Monitor renal function with daily CMP  OSA On BiPAP at home at night - Continue BiPAP  Hypothyroidism Home med: Levothyroxine 175  mcg -Continue home meds  Hyperlipidemia Lipid panel within normal limits.  LDL cholesterol 50.  On atorvastatin 80 mg once daily at home. -Continue home meds  GERD Stable, on Protonix 40  mg twice daily at home. -Continue home meds  Right shin blister, left heel ulcer - Wound consult  FEN/GI: Regular Prophylaxis: Eliquis  Disposition: Stable  History of Present Illness:  [name] is a 70 y.o. female presenting with left leg weakness.  She was not able to get to bed last night.  She is normally able to walk from her bed to the bathroom.  Uses a electric wheelchair at baseline, and has some residual left-sided weakness from CVA in 2016 and 2019.  She lives at home with her sister who helps her with cooking meals, showering and bathing as she is unable to do ADLs on her own.  Her sister noted she was having some difficulty with speech last night as well.  She denied any visual changes, numbness, tingling, chest pain, heart palpitations or worsening left side weakness.  She has been taking her blood sugars at home and has not had any hypoglycemic episodes.  She also endorses shortness of breath that has been worsening for 6 weeks that "comes and goes" and says that she has gained weight recently.  She does not use a home inhaler.  She uses BiPAP at night.  She is followed by cardiologist.  Reubin Milan lay flat secondary to injury she had when she was 19, for which she takes gabapentin for.  She has also had a blister on her right shin for 1 week.  It started out itchy, and she says she continued to scratch it which "caused it to become a blister"   Review Of Systems: Per HPI with the following additions:   Review of Systems  Constitutional:  Negative for chills, fatigue and fever.  Eyes:  Negative for visual disturbance.  Respiratory:  Positive for cough.   Cardiovascular:  Negative for chest pain and palpitations.  Gastrointestinal:  Negative for abdominal pain, constipation, diarrhea, nausea and vomiting.  Genitourinary:  Negative for difficulty urinating.  Neurological:  Positive for weakness. Negative for dizziness, syncope, facial asymmetry, speech difficulty,  light-headedness, numbness and headaches.    Patient Active Problem List   Diagnosis Date Noted   Weakness of left side of body 08/04/2021   Wound of left foot 07/26/2021   Unable to perform bed mobility without assistance 07/26/2021   Pressure ulcer 06/29/2021   Stiffness due to immobility 05/18/2021   Healthcare maintenance 02/17/2021   Mild cognitive impairment 09/03/2020   Presbyopia 09/03/2020   Counseling regarding advanced directives 09/03/2020   Constipation 09/03/2020   Imbalance 09/03/2020   Decreased range of motion of left shoulder 09/03/2020   Chronic lower back pain 07/27/2020   S/P placement of cardiac pacemaker 06/25/20  06/26/2020   Chronic anticoagulation 03/08/2020   Hemiparesis and numbness affecting left side as late effect of cerebrovascular accident (CVA) ([place]) 03/08/2020   History of Falls with injury 03/08/2020   Hypoalbuminemia 03/08/2020   Secondary hypercoagulable state ([place]) 11/12/2019   Dependent for wheelchair mobility 08/05/2019   OSA (obstructive sleep apnea) 05/27/2019   Paroxysmal atrial fibrillation ([place]) 05/27/2019   Insulin pump in place    (HFpEF) heart failure with preserved ejection fraction ([place]) 03/17/2019   Mood disorder ([place]) 10/29/2018   Hypertension associated with diabetes ([place]) 10/29/2015   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 ([place])  Lower extremity edema 08/20/2013   Morbid obesity ([place]) 05/29/2013   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 ([place]) [id number]   Hypothyroidism, 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   GERD (gastroesophageal reflux disease) 01/25/2012    Past Medical History: Past Medical History:  Diagnosis Date   Abnormal echocardiogram    Anemia    Arthritis    BPPV (benign paroxysmal positional vertigo), right 06/13/2017   Bradycardia 03/07/2020   Cerebrovascular accident (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 ([place]) 10/29/2015   CHB (complete heart block) ([place])  03/08/2020   CHF (congestive heart failure) ([place])    Closed fracture of left proximal humerus 07/08/2019   Decreased range of motion of left shoulder 09/03/2020   Depression    Dizzy 03/07/2020   bradycardia   Edentulous 09/03/2020   GERD (gastroesophageal reflux disease)    Hemiparesis affecting left side as late effect of cerebrovascular accident (CVA) ([place]) 03/08/2020   Hemorrhoids    History of Falls with injury 03/08/2020   Fall resulting in left humeral fracture   Hyperlipidemia    Hypertension    Hypothyroidism    Intracranial vascular stenosis    Near syncope 03/08/2020   Paroxysmal atrial fibrillation ([facility])    Presence of permanent cardiac pacemaker    Proteinuria 03/08/2020   Seasonal allergies    Stroke ([place])    Thyroid cancer ([place])    per pt S/p Total Thyroidectomy with Radioactive Iodine Therapy   Type 1 diabetes mellitus ([place])     Past Surgical History: Past Surgical History:  Procedure Laterality Date   ABDOMINAL ANGIOGRAM N/A 05/14/2012   Procedure: ABDOMINAL ANGIOGRAM;  Surgeon: Laverda Page, MD;  Location: [facility] CATH LAB;  Service: Cardiovascular;  Laterality: N/A;   ABDOMINAL HYSTERECTOMY  1995   partial   ANKLE FRACTURE SURGERY Right    CARDIAC CATHETERIZATION     with coronary angiogram   cataract  Bilateral 2018   COLONOSCOPY N/A 09/09/2014   Procedure: COLONOSCOPY;  Surgeon: Gatha Mayer, MD;  Location: [place];  Service: Endoscopy;  Laterality: N/A;   ESOPHAGOGASTRODUODENOSCOPY (EGD) WITH PROPOFOL N/A 12/03/2018   Procedure: ESOPHAGOGASTRODUODENOSCOPY (EGD) WITH PROPOFOL;  Surgeon: Doran Stabler, MD;  Location: [facility];  Service: Gastroenterology;  Laterality: N/A;   LEFT AND RIGHT HEART CATHETERIZATION WITH CORONARY ANGIOGRAM N/A 05/14/2012   Procedure: LEFT AND RIGHT HEART CATHETERIZATION WITH CORONARY ANGIOGRAM;  Surgeon: Laverda Page, MD;  Location: [facility] CATH LAB;  Service: Cardiovascular;  Laterality: N/A;   LOOP RECORDER  INSERTION N/A 12/23/2018   Procedure: LOOP RECORDER INSERTION;  Surgeon: Constance Haw, MD;  Location: [facility] CV LAB;  Service: Cardiovascular;  Laterality: N/A;   LOOP RECORDER REMOVAL N/A 06/25/2020   Procedure: LOOP RECORDER REMOVAL;  Surgeon: Constance Haw, MD;  Location: [place] CV LAB;  Service: Cardiovascular;  Laterality: N/A;   MINI METER   09/08/2014   ELECTRONIC INSULIN PUMP   PACEMAKER IMPLANT N/A 06/25/2020   Procedure: PACEMAKER IMPLANT;  Surgeon: Constance Haw, MD;  Location: [facility] CV LAB;  Service: Cardiovascular;  Laterality: N/A;   TEE WITHOUT CARDIOVERSION N/A 10/16/2018   Procedure: TRANSESOPHAGEAL ECHOCARDIOGRAM (TEE);  Surgeon: Buford Dresser, MD;  Location: [facility] ENDOSCOPY;  Service: Cardiovascular;  Laterality: N/A;   THYROIDECTOMY  2006    Social History: Social History   Tobacco Use   Smoking status: Never   Smokeless tobacco: Never  Vaping Use   Vaping Use: Never used  Substance Use Topics   Alcohol use:  No   Drug use: No   Please also refer to relevant sections of EMR.  Family History: Family History  Problem Relation Age of Onset   Heart disease Mother    Hyperlipidemia Mother    Hypertension Mother    Renal Disease Mother        insufficiency   Heart attack Mother        in his 13's   Early death Father        Head Injury at Work   Hyperlipidemia Father    Hypertension Father    Stroke Son    Diabetes Maternal Aunt    Prostate cancer Paternal Uncle    Heart disease Maternal Aunt    Heart failure Maternal Grandfather    Heart disease Maternal Grandfather    Heart attack Maternal Grandfather        Died in his 65's   Breast cancer Neg Hx     Allergies and Medications: Allergies  Allergen Reactions   Latex Rash   No current facility-administered medications on file prior to encounter.   Current Outpatient Medications on File Prior to Encounter  Medication Sig Dispense Refill   ACCU-CHEK GUIDE  test strip      acetaminophen (TYLENOL) 500 MG tablet Take 1,000 mg by mouth every 8 (eight) hours as needed for mild pain.      amLODipine (NORVASC) 10 MG tablet Take 1 tablet (10 mg total) by mouth daily. 90 tablet 1   apixaban (ELIQUIS) 5 MG TABS tablet Take 1 tablet (5 mg total) by mouth 2 (two) times daily. 60 tablet 9   atorvastatin (LIPITOR) 80 MG tablet Take 1 tablet (80 mg total) by mouth every evening. 90 tablet 2   baclofen (LIORESAL) 10 MG tablet Take 0.5 tablets (5 mg total) by mouth 2 (two) times daily as needed for muscle spasms. 30 each 0   Continuous Blood Gluc Transmit (DEXCOM G6 TRANSMITTER) MISC by Does not apply route. Checks her blood sugar     CRANBERRY EXTRACT PO Take 1 capsule by mouth daily.     fexofenadine (ALLEGRA) 180 MG tablet Take 180 mg by mouth daily as needed for allergies or rhinitis.     FLUoxetine (PROZAC) 10 MG capsule TAKE ONE CAPSULE BY MOUTH EVERY DAY WITH '20MG'$  DOSE (Patient taking differently: Take 10 mg by mouth daily.) 90 capsule 1   FLUoxetine (PROZAC) 20 MG tablet TAKE 1 TABLET BY MOUTH EVERY DAY ALONG WITH 10 MG FOR A TOTAL DOSE OF 30 MG EVERY DAY (Patient taking differently: Take 20 mg by mouth daily.) 90 tablet 2   gabapentin (NEURONTIN) 300 MG capsule TAKE 1 CAPSULE(300 MG) BY MOUTH THREE TIMES DAILY (Patient taking differently: Take 300 mg by mouth 3 (three) times daily.) 270 capsule 2   insulin aspart (NOVOLOG) 100 UNIT/ML injection Inject 20-30 Units into the skin daily. To be used in patient's insulin pump 10 mL    Insulin Human (INSULIN PUMP) SOLN Inject 1 each into the skin continuous. Novolog insulin 1 each 0   levothyroxine (SYNTHROID) 175 MCG tablet Take 1 tablet (175 mcg total) by mouth daily before breakfast. 90 tablet 1   loperamide (IMODIUM A-D) 2 MG tablet Take 1 tablet (2 mg total) by mouth 4 (four) times daily as needed for diarrhea or loose stools. 30 tablet 2   losartan (COZAAR) 100 MG tablet Take 1 tablet (100 mg total) by mouth  daily. 90 tablet 1   Multiple Vitamins-Minerals (OCUVITE PO) Take 1  tablet by mouth daily.     pantoprazole (PROTONIX) 40 MG tablet Take 1 tablet (40 mg total) by mouth 2 (two) times daily. 120 tablet 3   potassium chloride (KLOR-CON) 10 MEQ tablet Take 1 tablet (10 mEq total) by mouth daily. 90 tablet 1   torsemide (DEMADEX) 10 MG tablet TAKE 1 TABLET(10 MG) BY MOUTH DAILY AS NEEDED (Patient taking differently: Take 10 mg by mouth daily.) 90 tablet 0    Objective: BP (!) 106/41   Pulse 67   Temp 98.4 F (36.9 C)   Resp 14   SpO2 96%  Exam: General: Well-appearing, in no acute distress, pleasant and conversational Eyes: PERRLA ENTM: No pharyngeal erythema, MMM Neck: Supple, normal ROM Cardiovascular: IRR, no JVD appreciated Respiratory: CTAB, diminished at bases, no wheezing, crackles appreciated and mild IWOB on exertion Gastrointestinal: Obese abdomen, soft, non-tender, non-distended, no rebound or guarding MSK:b/l pitting 1+ edema, 2 left shin blisters with erythematous base with dried serous fluid, l Derm: Noted blisters on anterior left shin, left heel.     Neuro: Awake, alert, oriented to person, place, time with normal speech. Visual fields normal in all quadrants (hx cataracts). Extraocular movement intact without ptosis. Facial sensation intact. Facial muscle strength normal and intact bilaterally. Voice is normal. Shoulder shrug strong and equal bilaterally. Tongue protrudes midline and moves symmetrically. 5/5 strength in bilateral upper/lower extremities. Unable to perform heel-to-shin test bilaterally secondary to body habitus/movement limitations Psych: Normal mood and affect.  Labs and Imaging: CBC BMET  Recent Labs  Lab 08/05/21 0440  WBC 4.6  HGB 10.3*  HCT 33.3*  PLT 78*   Recent Labs  Lab 08/05/21 0440 08/05/21 0557  NA 137  --   K 5.8* 4.3  CL 105  --   CO2 20*  --   BUN 21  --   CREATININE 1.08*  --   GLUCOSE 302*  --   CALCIUM 8.6*  --       EKG: A fib. Ventricular-paced, 65bpm, no acute ST or T wave changes  Carollee Leitz, MD 08/05/2021, 7:56 AM PGY-1, [place] Intern pager: [phone], text pages welcome   FPTS Upper-Level Resident Addendum   I have independently interviewed and examined the patient. I have discussed the above with the original author and agree with their documentation. Please see also any attending notes.   Carollee Leitz, MD PGY-3, [place] [facility] 08/05/2021 7:56 AM  FPTS Service pager: [phone] (text pages welcome through [facility])

## 2021-08-04 NOTE — Telephone Encounter (Signed)
Agree with urgent ED evaluation as recommended for patient.

## 2021-08-04 NOTE — ED Notes (Signed)
Pt is in CT

## 2021-08-04 NOTE — ED Provider Notes (Signed)
Emergency Medicine Provider Triage Evaluation Note  [name] , a 70 y.o. female  was evaluated in triage.  Pt complains of left leg weakness.  Patient states that she noticed weakness this morning at 0 500 after waking when she was unable to get herself back in bed.  Patient reports that weakness has been constant since then.  Patient also endorses slurred speech.  When asked patient states that she has intermittent slurred speech.  Her speech has been present since this morning.  Patient denies any facial asymmetry, numbness, headache, visual disturbance, chest pain, shortness of breath.  Review of Systems  Positive: Left leg weakness, slurred speech Negative: facial asymmetry, numbness, headache, visual disturbance, chest pain, shortness of breath.  Physical Exam  BP 123/90   Pulse 78   Temp 98.8 F (37.1 C)   Resp 18   SpO2 94%  Gen:   Awake, no distress   Resp:  Normal effort  MSK:   Moves extremities without difficulty  Other:  Patient noted to have slurred speech.  EOM intact bilaterally, pupils PERRL, + strength to bilateral upper extremities.  Patient able to move lower extremities equally.  Grip strength equal.  Pronator drift negative.  Sensation to light touch intact to bilateral upper and lower extremities.  Medical Decision Making  Medically screening exam initiated at 4:47 PM.  Appropriate orders placed.  [name] was informed that the remainder of the evaluation will be completed by another provider, this initial triage assessment does not replace that evaluation, and the importance of remaining in the ED until their evaluation is complete.  The patient appears stable so that the remainder of the work up may be completed by another provider.      [name], PA-C 08/04/21 1649    [name], MD 08/04/21 [phone]

## 2021-08-04 NOTE — ED Triage Notes (Signed)
Pt c/o left leg weakness that she first noticed at 5am this morning when she was unable to get herself back in bed. A&Ox4, also c/o shortness of breath.

## 2021-08-04 NOTE — ED Provider Notes (Signed)
[facility] EMERGENCY DEPARTMENT Provider Note   CSN: [id number] Arrival date & time: 08/04/21  1618     History Chief Complaint  Patient presents with   Extremity Weakness    [name] is a 70 y.o. female.  HPI 70 year old female history of CHF, CVA, type 2 diabetes, presents today complaining of increased weakness of her left leg.  She states that this was present on awakening today.  She reports she went to bed about 10:00 last night and was able to walk as usual.  She reports that she had difficulty walking this morning on awakening.  She does not identify speech slurring but states that she has been told that her speech has been slurring today.  She denies any visual changes, arm weakness, numbness, tingling.  She is on Eliquis.  She has an insulin pump and her blood sugar is currently 130.  Denies any history of seizures.     Past Medical History:  Diagnosis Date   Abnormal echocardiogram    Anemia    Arthritis    BPPV (benign paroxysmal positional vertigo), right 06/13/2017   Bradycardia 03/07/2020   Cerebrovascular accident (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 ([place]) 10/29/2015   CHB (complete heart block) ([place]) 03/08/2020   CHF (congestive heart failure) ([place])    Closed fracture of left proximal humerus 07/08/2019   Decreased range of motion of left shoulder 09/03/2020   Depression    Dizzy 03/07/2020   bradycardia   Edentulous 09/03/2020   GERD (gastroesophageal reflux disease)    Hemiparesis affecting left side as late effect of cerebrovascular accident (CVA) ([place]) 03/08/2020   Hemorrhoids    History of Falls with injury 03/08/2020   Fall resulting in left humeral fracture   Hyperlipidemia    Hypertension    Hypothyroidism    Intracranial vascular stenosis    Near syncope 03/08/2020   Paroxysmal atrial fibrillation (HCC)    Presence of permanent cardiac pacemaker    Proteinuria 03/08/2020   Seasonal allergies     Stroke [facility])    Thyroid cancer ([place])    per pt S/p Total Thyroidectomy with Radioactive Iodine Therapy   Type 1 diabetes mellitus ([place])     Patient Active Problem List   Diagnosis Date Noted   Wound of left foot 07/26/2021   Unable to perform bed mobility without assistance 07/26/2021   Pressure ulcer 06/29/2021   Stiffness due to immobility 05/18/2021   Healthcare maintenance 02/17/2021   Mild cognitive impairment 09/03/2020   Presbyopia 09/03/2020   Counseling regarding advanced directives 09/03/2020   Constipation 09/03/2020   Imbalance 09/03/2020   Decreased range of motion of left shoulder 09/03/2020   Chronic lower back pain 07/27/2020   S/P placement of cardiac pacemaker 06/25/20  06/26/2020   Chronic anticoagulation 03/08/2020   Hemiparesis and numbness affecting left side as late effect of cerebrovascular accident (CVA) ([place]) 03/08/2020   History of Falls with injury 03/08/2020   Hypoalbuminemia 03/08/2020   Secondary hypercoagulable state ([place]) 11/12/2019   Dependent for wheelchair mobility 08/05/2019   OSA (obstructive sleep apnea) 05/27/2019   Paroxysmal atrial fibrillation ([place]) 05/27/2019   Insulin pump in place    (HFpEF) heart failure with preserved ejection fraction ([place]) 03/17/2019   Mood disorder ([place]) 10/29/2018   Hypertension associated with diabetes ([place]) 10/29/2015   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 ([place])    Lower extremity edema 08/20/2013   Morbid obesity ([place]) 05/29/2013  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 ([place]) [id number]   Hypothyroidism, 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   GERD (gastroesophageal reflux disease) 01/25/2012    Past Surgical History:  Procedure Laterality Date   ABDOMINAL ANGIOGRAM N/A 05/14/2012   Procedure: ABDOMINAL ANGIOGRAM;  Surgeon: Laverda Page, MD;  Location: [facility] C[facility] LAB;  Service: Cardiovascular;  Laterality: N/A;   ABDOMINAL HYSTERECTOMY  1995   partial   ANKLE  FRACTURE SURGERY Right    CARDIAC CATHETERIZATION     with coronary angiogram   cataract  Bilateral 2018   COLONOSCOPY N/A 09/09/2014   Procedure: COLONOSCOPY;  Surgeon: Gatha Mayer, MD;  Location: [place];  Service: Endoscopy;  Laterality: N/A;   ESOPHAGOGASTRODUODENOSCOPY (EGD) WITH PROPOFOL N/A 12/03/2018   Procedure: ESOPHAGOGASTRODUODENOSCOPY (EGD) WITH PROPOFOL;  Surgeon: Doran Stabler, MD;  Location: [place];  Service: Gastroenterology;  Laterality: N/A;   LEFT AND RIGHT HEART CATHETERIZATION WITH CORONARY ANGIOGRAM N/A 05/14/2012   Procedure: LEFT AND RIGHT HEART CATHETERIZATION WITH CORONARY ANGIOGRAM;  Surgeon: Laverda Page, MD;  Location: [facility] CATH LAB;  Service: Cardiovascular;  Laterality: N/A;   LOOP RECORDER INSERTION N/A 12/23/2018   Procedure: LOOP RECORDER INSERTION;  Surgeon: Constance Haw, MD;  Location: [place] CV LAB;  Service: Cardiovascular;  Laterality: N/A;   LOOP RECORDER REMOVAL N/A 06/25/2020   Procedure: LOOP RECORDER REMOVAL;  Surgeon: Constance Haw, MD;  Location: [place] CV LAB;  Service: Cardiovascular;  Laterality: N/A;   MINI METER   09/08/2014   ELECTRONIC INSULIN PUMP   PACEMAKER IMPLANT N/A 06/25/2020   Procedure: PACEMAKER IMPLANT;  Surgeon: Constance Haw, MD;  Location: [place] CV LAB;  Service: Cardiovascular;  Laterality: N/A;   TEE WITHOUT CARDIOVERSION N/A 10/16/2018   Procedure: TRANSESOPHAGEAL ECHOCARDIOGRAM (TEE);  Surgeon: Buford Dresser, MD;  Location: [facility] ENDOSCOPY;  Service: Cardiovascular;  Laterality: N/A;   THYROIDECTOMY  2006     OB History   No obstetric history on file.     Family History  Problem Relation Age of Onset   Heart disease Mother    Hyperlipidemia Mother    Hypertension Mother    Renal Disease Mother        insufficiency   Heart attack Mother        in his 30's   Early death Father        Head Injury at Work   Hyperlipidemia Father    Hypertension Father     Stroke Son    Diabetes Maternal Aunt    Prostate cancer Paternal Uncle    Heart disease Maternal Aunt    Heart failure Maternal Grandfather    Heart disease Maternal Grandfather    Heart attack Maternal Grandfather        Died in his 59's   Breast cancer Neg Hx     Social History   Tobacco Use   Smoking status: Never   Smokeless tobacco: Never  Vaping Use   Vaping Use: Never used  Substance Use Topics   Alcohol use: No   Drug use: No    Home Medications Prior to Admission medications   Medication Sig Start Date End Date Taking? Authorizing Provider  ACCU-CHEK GUIDE test strip  01/05/20   [provider]  acetaminophen (TYLENOL) 500 MG tablet Take 1,000 mg by mouth every 8 (eight) hours as needed for mild pain.     [provider]  amLODipine (NORVASC) 10 MG tablet Take 1 tablet (10 mg total) by mouth  daily. 02/15/21 08/14/21  [name], [name], MD  apixaban (ELIQUIS) 5 MG TABS tablet Take 1 tablet (5 mg total) by mouth 2 (two) times daily. 10/07/20   Camnitz, Ocie Doyne, MD  atorvastatin (LIPITOR) 80 MG tablet Take 1 tablet (80 mg total) by mouth every evening. 02/24/21   [name], [name], MD  baclofen (LIORESAL) 10 MG tablet Take 0.5 tablets (5 mg total) by mouth 2 (two) times daily as needed for muscle spasms. 06/06/21   Anderson, Chelsey L, DO  Continuous Blood Gluc Transmit (DEXCOM G6 TRANSMITTER) MISC by Does not apply route. Checks her blood sugar    [provider]  CRANBERRY EXTRACT PO Take 1 capsule by mouth daily.    [provider]  fexofenadine (ALLEGRA) 180 MG tablet Take 180 mg by mouth daily as needed for allergies or rhinitis.    [provider]  FLUoxetine (PROZAC) 10 MG capsule TAKE ONE CAPSULE BY MOUTH EVERY DAY WITH '20MG'$  DOSE 02/01/21   [name], Makiera, MD  FLUoxetine (PROZAC) 20 MG tablet TAKE 1 TABLET BY MOUTH EVERY DAY ALONG WITH 10 MG FOR A TOTAL DOSE OF 30 MG EVERY DAY 09/13/20    [name], Makiera, MD  gabapentin (NEURONTIN) 300 MG capsule TAKE 1 CAPSULE(300 MG) BY MOUTH THREE TIMES DAILY 07/14/21   [name], Makiera, MD  insulin aspart (NOVOLOG) 100 UNIT/ML injection Inject 20-30 Units into the skin daily. To be used in patient's insulin pump 01/20/21   [name], Makiera, MD  Insulin Human (INSULIN PUMP) SOLN Inject 1 each into the skin continuous. Novolog insulin 01/02/20   Guadalupe Dawn, MD  levothyroxine (SYNTHROID) 175 MCG tablet Take 1 tablet (175 mcg total) by mouth daily before breakfast. 07/22/21   [name], [name], MD  loperamide (IMODIUM A-D) 2 MG tablet Take 1 tablet (2 mg total) by mouth 4 (four) times daily as needed for diarrhea or loose stools. 06/02/20   Shirley, [name], DO  losartan (COZAAR) 100 MG tablet Take 1 tablet (100 mg total) by mouth daily. 03/16/21   [name], Makiera, MD  Multiple Vitamins-Minerals (OCUVITE PO) Take 1 tablet by mouth daily.    [provider]  pantoprazole (PROTONIX) 40 MG tablet Take 1 tablet (40 mg total) by mouth 2 (two) times daily. 11/15/20   [name], Makiera, MD  potassium chloride (KLOR-CON) 10 MEQ tablet Take 1 tablet (10 mEq total) by mouth daily. 11/15/20   [name], Makiera, MD  torsemide (DEMADEX) 10 MG tablet TAKE 1 TABLET(10 MG) BY MOUTH DAILY AS NEEDED 07/22/21   [name], [name], MD    Allergies    Latex  Review of Systems   Review of Systems  All other systems reviewed and are negative.  Physical Exam Updated Vital Signs BP (!) 161/68   Pulse 63   Temp 98.8 F (37.1 C)   Resp (!) 21   SpO2 95%   Physical Exam Vitals and nursing note reviewed.  Constitutional:      General: She is not in acute distress.    Appearance: Normal appearance. She is obese.  HENT:     Head: Normocephalic and atraumatic.     Nose: Nose normal.     Mouth/Throat:     Mouth: Mucous membranes are moist.  Eyes:     Extraocular Movements: Extraocular  movements intact.     Pupils: Pupils are equal, round, and reactive to light.  Cardiovascular:     Rate and Rhythm: Normal rate and regular rhythm.     Pulses: Normal pulses.  Pulmonary:  Effort: Pulmonary effort is normal.     Breath sounds: Normal breath sounds.  Abdominal:     General: Bowel sounds are normal.     Palpations: Abdomen is soft.  Musculoskeletal:        General: Normal range of motion.     Cervical back: Normal range of motion.  Skin:    General: Skin is warm.     Capillary Refill: Capillary refill takes less than 2 seconds.  Neurological:     Mental Status: She is alert.     Cranial Nerves: No cranial nerve deficit.     Comments: Some left palmar drift Mild dysarthria Patient is able to hold her left leg out straight equal to right with initial examination wheelchair  Psychiatric:        Mood and Affect: Mood normal.        Behavior: Behavior normal.    ED Results / Procedures / Treatments   Labs (all labs ordered are listed, but only abnormal results are displayed) Labs Reviewed  PROTIME-INR - Abnormal; Notable for the following components:      Result Value   Prothrombin Time 16.6 (*)    INR 1.3 (*)    All other components within normal limits  CBC - Abnormal; Notable for the following components:   Hemoglobin 11.3 (*)    All other components within normal limits  COMPREHENSIVE METABOLIC PANEL - Abnormal; Notable for the following components:   Glucose, Bld 152 (*)    Creatinine, Ser 1.28 (*)    Albumin 3.2 (*)    GFR, Estimated 45 (*)    All other components within normal limits  RESP PANEL BY RT-PCR (FLU A&B, COVID) [id number]  APTT  DIFFERENTIAL    EKG EKG Interpretation  Date/Time:  Thursday August 04 2021 16:27:48 EDT Ventricular Rate:  66 PR Interval:    QRS Duration: 98 QT Interval:  434 QTC Calculation: 454 R Axis:   -9 Text Interpretation: Atrial fibrillation with frequent ventricular-paced complexes Minimal voltage criteria for  LVH, may be normal variant ( R in aVL ) Abnormal QRS-T angle, consider primary T wave abnormality Abnormal ECG Confirmed by [name] [phone]) on 08/04/2021 6:05:23 PM  Radiology CT HEAD WO CONTRAST (5MM)  Result Date: 08/04/2021 CLINICAL DATA:  Acute left lower extremity weakness. EXAM: CT HEAD WITHOUT CONTRAST TECHNIQUE: Contiguous axial images were obtained from the base of the skull through the vertex without intravenous contrast. COMPARISON:  March 07, 2020. FINDINGS: Brain: No evidence of acute infarction, hemorrhage, hydrocephalus, extra-axial collection or mass lesion/mass effect. Vascular: No hyperdense vessel or unexpected calcification. Skull: Normal. Negative for fracture or focal lesion. Sinuses/Orbits: No acute finding. Other: None. IMPRESSION: No acute intracranial abnormality seen. Electronically Signed   By: Marijo Conception M.D.   On: 08/04/2021 18:08    Procedures .Critical Care  Date/Time: 08/04/2021 9:01 PM Performed by: [name], MD Authorized by: [name], MD   Critical care provider statement:    Critical care time (minutes):  45   Critical care end time:  08/04/2021 9:01 PM   Critical care was time spent personally by me on the following activities:  Discussions with consultants, evaluation of patient's response to treatment, examination of patient, ordering and performing treatments and interventions, ordering and review of laboratory studies, ordering and review of radiographic studies, pulse oximetry, re-evaluation of patient's condition, obtaining history from patient or surrogate and review of old charts   Medications Ordered in ED Medications - No data to  display  ED Course  I have reviewed the triage vital signs and the nursing notes.  Pertinent labs & imaging results that were available during my care of the patient were reviewed by me and considered in my medical decision making (see chart for details).    MDM Rules/Calculators/A&P                           70 year old female history of diabetes, hypertension, prior stroke presents today with 18 hours of new onset of left leg weakness.  She is unable to walk secondary to the new weakness.  She also has some slurring of her speech.  Patient had CT obtained that does not show any acute abnormalities and no bleeding.  The remainder of her work-up showed no evidence of metabolic abnormalities that would be causing symptoms.  Her blood sugar has ranged from 98-1 60.  Creatinine is mildly elevated at 1.28.  Her hemoglobin is stable at 11.3. Patient continues to have weakness and inability to ambulate. MRI unable to be obtained tonight due to pacemaker. Discussed with neurology, Dr. Curly Sh[place], she reviewed chart and advises keeping her blood pressure where it is about 160 tonight.  She will see and write formal consult Patient is followed by family practice clinic.  Patient care discussed with Dr. Volanda Napoleon and they have accepted patient to the family practice service will see for admission.  Final Clinical Impression(s) / ED Diagnoses Final diagnoses:  Weakness  Cerebrovascular accident (CVA), unspecified mechanism [facility])    Rx / [place] Orders ED Discharge Orders     None        [name], MD 08/04/21 2101

## 2021-08-04 NOTE — Telephone Encounter (Signed)
Patient calls nurse line reporting sudden onset of "one" sided weakness. Patient reports she is not able to walk without help from her sister. Patient denies any facial changes, headaches, or blurred vision. Patients speech did sound slurred on the phone. Patients sister is with her and has agreed to take her to the ED.

## 2021-08-04 NOTE — ED Notes (Signed)
RN unsuccessfully attempted to start IV x2.

## 2021-08-04 NOTE — ED Notes (Signed)
Pt suspended insulin pump  

## 2021-08-05 ENCOUNTER — Inpatient Hospital Stay (HOSPITAL_COMMUNITY): Payer: Medicare PPO

## 2021-08-05 DIAGNOSIS — I639 Cerebral infarction, unspecified: Secondary | ICD-10-CM | POA: Diagnosis not present

## 2021-08-05 DIAGNOSIS — R0609 Other forms of dyspnea: Secondary | ICD-10-CM

## 2021-08-05 LAB — BASIC METABOLIC PANEL
Anion gap: 12 (ref 5–15)
Anion gap: 7 (ref 5–15)
BUN: 21 mg/dL (ref 8–23)
BUN: 24 mg/dL — ABNORMAL HIGH (ref 8–23)
CO2: 20 mmol/L — ABNORMAL LOW (ref 22–32)
CO2: 25 mmol/L (ref 22–32)
Calcium: 8.6 mg/dL — ABNORMAL LOW (ref 8.9–10.3)
Calcium: 8.9 mg/dL (ref 8.9–10.3)
Chloride: 105 mmol/L (ref 98–111)
Chloride: 107 mmol/L (ref 98–111)
Creatinine, Ser: 1.08 mg/dL — ABNORMAL HIGH (ref 0.44–1.00)
Creatinine, Ser: 1.25 mg/dL — ABNORMAL HIGH (ref 0.44–1.00)
GFR, Estimated: 55 mL/min — ABNORMAL LOW (ref >60)
GFR, Estimated: 46 mL/min — ABNORMAL LOW (ref >60)
Glucose, Bld: 302 mg/dL — ABNORMAL HIGH (ref 70–99)
Glucose, Bld: 252 mg/dL — ABNORMAL HIGH (ref 70–99)
Potassium: 5.8 mmol/L — ABNORMAL HIGH (ref 3.5–5.1)
Potassium: 4 mmol/L (ref 3.5–5.1)
Sodium: 137 mmol/L (ref 135–145)
Sodium: 139 mmol/L (ref 135–145)

## 2021-08-05 LAB — LIPID PANEL
Cholesterol: 102 mg/dL (ref 0–200)
HDL: 44 mg/dL (ref >40)
LDL Cholesterol: 50 mg/dL (ref 0–99)
Total CHOL/HDL Ratio: 2.3 RATIO
Triglycerides: 39 mg/dL (ref <150)
VLDL: 8 mg/dL (ref 0–40)

## 2021-08-05 LAB — HEPATIC FUNCTION PANEL
ALT: 22 U/L (ref 0–44)
AST: 21 U/L (ref 15–41)
Albumin: 2.8 g/dL — ABNORMAL LOW (ref 3.5–5.0)
Alkaline Phosphatase: 78 U/L (ref 38–126)
Bilirubin, Direct: 0.1 mg/dL (ref 0.0–0.2)
Indirect Bilirubin: 0.4 mg/dL (ref 0.3–0.9)
Total Bilirubin: 0.5 mg/dL (ref 0.3–1.2)
Total Protein: 6 g/dL — ABNORMAL LOW (ref 6.5–8.1)

## 2021-08-05 LAB — CBC
HCT: 33.3 % — ABNORMAL LOW (ref 36.0–46.0)
Hemoglobin: 10.3 g/dL — ABNORMAL LOW (ref 12.0–15.0)
MCH: 28.6 pg (ref 26.0–34.0)
MCHC: 30.9 g/dL (ref 30.0–36.0)
MCV: 92.5 fL (ref 80.0–100.0)
Platelets: 78 10*3/uL — ABNORMAL LOW (ref 150–400)
RBC: 3.6 MIL/uL — ABNORMAL LOW (ref 3.87–5.11)
RDW: 13 % (ref 11.5–15.5)
WBC: 4.6 10*3/uL (ref 4.0–10.5)
nRBC: 0 % (ref 0.0–0.2)

## 2021-08-05 LAB — ECHOCARDIOGRAM COMPLETE
Area-P 1/2: 2.79 cm2
S' Lateral: 3.1 cm

## 2021-08-05 LAB — POTASSIUM: Potassium: 4.3 mmol/L (ref 3.5–5.1)

## 2021-08-05 LAB — HEMOGLOBIN A1C
Hgb A1c MFr Bld: 10.3 % — ABNORMAL HIGH (ref 4.8–5.6)
Mean Plasma Glucose: 248.91 mg/dL

## 2021-08-05 LAB — TSH: TSH: 1.61 u[IU]/mL (ref 0.350–4.500)

## 2021-08-05 LAB — CBG MONITORING, ED
Glucose-Capillary: 461 mg/dL — ABNORMAL HIGH (ref 70–99)
Glucose-Capillary: 236 mg/dL — ABNORMAL HIGH (ref 70–99)
Glucose-Capillary: 132 mg/dL — ABNORMAL HIGH (ref 70–99)
Glucose-Capillary: 135 mg/dL — ABNORMAL HIGH (ref 70–99)

## 2021-08-05 LAB — HIV ANTIBODY (ROUTINE TESTING W REFLEX): HIV Screen 4th Generation wRfx: NONREACTIVE

## 2021-08-05 LAB — BRAIN NATRIURETIC PEPTIDE: B Natriuretic Peptide: 130.6 pg/mL — ABNORMAL HIGH (ref 0.0–100.0)

## 2021-08-05 LAB — MAGNESIUM: Magnesium: 1.8 mg/dL (ref 1.7–2.4)

## 2021-08-05 MED ORDER — LORAZEPAM 1 MG PO TABS
1.0000 mg | ORAL_TABLET | Freq: Once | ORAL | Status: AC
  Administered 2021-08-05: 1 mg via ORAL
  Filled 2021-08-05: qty 1

## 2021-08-05 MED ORDER — INSULIN PUMP
Freq: Three times a day (TID) | SUBCUTANEOUS | Status: DC
  Administered 2021-08-06: 1.3 via SUBCUTANEOUS
  Administered 2021-08-06: 1.9 via SUBCUTANEOUS
  Administered 2021-08-06: 2.1 via SUBCUTANEOUS
  Administered 2021-08-07: 1.1 via SUBCUTANEOUS
  Administered 2021-08-10: 3.2 via SUBCUTANEOUS
  Administered 2021-08-10: 1.2 via SUBCUTANEOUS
  Filled 2021-08-05: qty 1

## 2021-08-05 MED ORDER — GADOBUTROL 1 MMOL/ML IV SOLN
10.0000 mL | Freq: Once | INTRAVENOUS | Status: AC | PRN
  Administered 2021-08-05: 10 mL via INTRAVENOUS

## 2021-08-05 MED ORDER — LORAZEPAM 1 MG PO TABS: 1.0000 mg | ORAL_TABLET | Freq: Once | ORAL | Status: DC

## 2021-08-05 MED ORDER — PERFLUTREN LIPID MICROSPHERE
1.0000 mL | INTRAVENOUS | Status: AC | PRN
  Filled 2021-08-05: qty 10

## 2021-08-05 MED ORDER — LORAZEPAM 1 MG PO TABS
1.5000 mg | ORAL_TABLET | Freq: Once | ORAL | Status: AC
  Administered 2021-08-05: 1.5 mg via ORAL
  Filled 2021-08-05: qty 2

## 2021-08-05 MED ORDER — LEVOTHYROXINE SODIUM 75 MCG PO TABS
175.0000 ug | ORAL_TABLET | Freq: Every day | ORAL | Status: DC
  Administered 2021-08-05 – 2021-08-10 (×6): 175 ug via ORAL
  Filled 2021-08-05 (×6): qty 1

## 2021-08-05 MED ORDER — LORAZEPAM 2 MG/ML IJ SOLN: 1.0000 mg | Freq: Once | INTRAMUSCULAR | Status: AC

## 2021-08-05 MED ORDER — FLUOXETINE HCL 20 MG PO CAPS
30.0000 mg | ORAL_CAPSULE | Freq: Every day | ORAL | Status: DC
  Administered 2021-08-05 – 2021-08-10 (×6): 30 mg via ORAL
  Filled 2021-08-05 (×6): qty 1

## 2021-08-05 MED ORDER — INSULIN ASPART 100 UNIT/ML IJ SOLN
5.0000 [IU] | Freq: Once | INTRAMUSCULAR | Status: AC
  Administered 2021-08-05: 5 [IU] via SUBCUTANEOUS

## 2021-08-05 MED ORDER — FLUOXETINE HCL 10 MG PO CAPS
10.0000 mg | ORAL_CAPSULE | Freq: Every day | ORAL | Status: DC
  Filled 2021-08-05: qty 1

## 2021-08-05 NOTE — ED Notes (Signed)
RT called to put pt on Bipap for bedtime per orders and pt request

## 2021-08-05 NOTE — ED Notes (Signed)
Ok for pt to go to MRI with transport per MD

## 2021-08-05 NOTE — Hospital Course (Addendum)
[name] [name] is a 70 y.o. female who presented with left-sided weakness.  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 PMH is significant for Type 1 DM, h/o CVA, a fib (on Eliquis) s/p placemaker 06/2020, HTN, HLD, morbid obesity, OSA, HFpEF, GERD, thyroid cancer s/p total thyroidectomy.  Acute Left Pontine Infarct Arrived to ED hemodynamically stable with slight slurring of speech, CT head impression revealed no acute intracranial abnormalities or hemorrhage. Remainder of workup was unremarkable for metabolic abnormalities. Blood glucose ranged from 98-160. Labs in ED remarkable for: PT 16.6, PTT 33, INR 1.3, WBC 4.5 Hgb 11.3, Cr 1.38, Glucose 152. TSH 0.209.  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  Stroke workup revealed an echo that was unchanged from previous echo in March of 2021 with LVEF 60-65%, mild LVH, and indeterminate diastolic function.  Carotid dopplers showed 1-39% stenosis bilaterally.  She was evaluated by PT and OT who felt that she would benefit from CIR for intensive rehabilitation. CIR agreed to admit her on 8/17. Stable for d/c.    Dyspnea  OSA On arrival she was complaining of dyspnea on exertion. CXR showed cardiomegaly and central vascular prominence but no overt pulmonary edema. She was also noted to have 1+ pitting edema bilaterally. Her blood pressures on admission were soft (90s/50s, MAP in the low 60s) and we therefore held her home torsemide. Orthostatic vital signs were positive for an increase in HR >30bpm, though this result may have been complicated by her A Fib. There was no decrease in her BP upon standing. She desatted to 89% on RA with  exertion. By hospital day 3, her BP had increased to [id number] systolic and her home torsemide was restarted.  Her blood pressures remained stable throughout the remainder of the hospitalization.   L Heel Ulcer She was found to have an unstageable ulcer of the left heel. Wound care was consulted and recommended use of bilateral Prevalon boots to prevent further pressure injury.   Items for PCP Follow-up 1) We decreased her gabapentin dose and held her baclofen while in hospital. Please revisit with her whether these are still needed  2) Follow up the wound on her L heel. She is at high risk for developing pressure ulcers elsewhere. Regular skin exams are recommended.

## 2021-08-05 NOTE — Progress Notes (Signed)
Patient to MRI from ED, accompanied by SWOT RN. Patient has medtronic device. [facility] express sent to mike-rep and Renee-cardiology PA. Orders received for DOO 100

## 2021-08-05 NOTE — Consult Note (Signed)
Neurology Consultation  Reason for Consult: Left lower extremity weakness  Referring Physician: Dr. Jeanell Sparrow  CC: "I couldn't walk, I think I had a stroke"  History is obtained from: Patient, Chart Review  HPI: [name] is a 70 y.o. 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 She states that when she woke yesterday morning around 05:00, she was unable to stand as she normally would from her wheelchair to get back into her bed and she felt that her speech was more slurred than usual from her pervious stroke prompting her to go to the ED for further evaluation.   At baseline, Ms. Corker uses an IT trainer wheelchair for transportation. She states that she is able to ambulate minimally with a few steps at a time using a rollator to transition from her chair to her bed and back. She endorses left lower extremity greater that left upper extremity weakness at baseline and dysarthria from her previous stroke.   The patient kept describing worsening or new left-sided weakness when on exam she did have significant right upper and lower extremity weakness.  LKW: 08/03/2021 prior to going to bed  tpa given?: no, outside of thrombolytic therapy window IR Thrombectomy? No, presentation is not consistent with LVO Modified Rankin Scale: 4-Needs assistance to walk and tend to bodily needs  ROS: A complete ROS was performed and is negative except as noted in the HPI.  Past Medical History:  Diagnosis Date   Abnormal echocardiogram    Anemia    Arthritis    BPPV (benign paroxysmal positional vertigo), right  06/13/2017   Bradycardia 03/07/2020   Cerebrovascular accident (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 ([place]) 10/29/2015   CHB (complete heart block) ([place]) 03/08/2020   CHF (congestive heart failure) ([place])    Closed fracture of left proximal humerus 07/08/2019   Decreased range of motion of left shoulder 09/03/2020   Depression    Dizzy 03/07/2020   bradycardia   Edentulous 09/03/2020   GERD (gastroesophageal reflux disease)    Hemiparesis affecting left side as late effect of cerebrovascular accident (CVA) ([place]) 03/08/2020   Hemorrhoids    History of Falls with injury 03/08/2020   Fall resulting in left humeral fracture   Hyperlipidemia    Hypertension    Hypothyroidism    Intracranial vascular stenosis    Near syncope 03/08/2020   Paroxysmal atrial fibrillation (HCC)    Presence of permanent cardiac pacemaker    Proteinuria 03/08/2020   Seasonal allergies    Stroke ([place])    Thyroid cancer ([place])    per pt S/p Total Thyroidectomy with Radioactive Iodine Therapy   Type 1 diabetes mellitus ([place])    Past Surgical History:  Procedure Laterality Date   ABDOMINAL ANGIOGRAM N/A 05/14/2012   Procedure: ABDOMINAL ANGIOGRAM;  Surgeon: Laverda Page, MD;  Location: [facility] CATH LAB;  Service: Cardiovascular;  Laterality: N/A;   ABDOMINAL HYSTERECTOMY  1995   partial   ANKLE FRACTURE SURGERY Right    CARDIAC CATHETERIZATION     with coronary angiogram   cataract  Bilateral 2018   COLONOSCOPY N/A 09/09/2014   Procedure: COLONOSCOPY;  Surgeon: Gatha Mayer, MD;  Location: [facility]  ENDOSCOPY;  Service: Endoscopy;  Laterality: N/A;   ESOPHAGOGASTRODUODENOSCOPY (EGD) WITH PROPOFOL N/A 12/03/2018   Procedure: ESOPHAGOGASTRODUODENOSCOPY (EGD) WITH PROPOFOL;  Surgeon: Doran Stabler, MD;  Location: [place];  Service: Gastroenterology;  Laterality: N/A;   LEFT AND RIGHT HEART CATHETERIZATION WITH CORONARY ANGIOGRAM N/A 05/14/2012   Procedure: LEFT AND RIGHT HEART CATHETERIZATION WITH  CORONARY ANGIOGRAM;  Surgeon: Laverda Page, MD;  Location: [facility] CATH LAB;  Service: Cardiovascular;  Laterality: N/A;   LOOP RECORDER INSERTION N/A 12/23/2018   Procedure: LOOP RECORDER INSERTION;  Surgeon: Constance Haw, MD;  Location: [place] CV LAB;  Service: Cardiovascular;  Laterality: N/A;   LOOP RECORDER REMOVAL N/A 06/25/2020   Procedure: LOOP RECORDER REMOVAL;  Surgeon: Constance Haw, MD;  Location: [place] CV LAB;  Service: Cardiovascular;  Laterality: N/A;   MINI METER   09/08/2014   ELECTRONIC INSULIN PUMP   PACEMAKER IMPLANT N/A 06/25/2020   Procedure: PACEMAKER IMPLANT;  Surgeon: Constance Haw, MD;  Location: [place] CV LAB;  Service: Cardiovascular;  Laterality: N/A;   TEE WITHOUT CARDIOVERSION N/A 10/16/2018   Procedure: TRANSESOPHAGEAL ECHOCARDIOGRAM (TEE);  Surgeon: Buford Dresser, MD;  Location: [place];  Service: Cardiovascular;  Laterality: N/A;   THYROIDECTOMY  2006   Family History  Problem Relation Age of Onset   Heart disease Mother    Hyperlipidemia Mother    Hypertension Mother    Renal Disease Mother        insufficiency   Heart attack Mother        in his 81's   Early death Father        Head Injury at Work   Hyperlipidemia Father    Hypertension Father    Stroke Son    Diabetes Maternal Aunt    Prostate cancer Paternal Uncle    Heart disease Maternal Aunt    Heart failure Maternal Grandfather    Heart disease Maternal Grandfather    Heart attack Maternal Grandfather        Died in his 23's   Breast cancer Neg Hx    Social History:   reports that she has never smoked. She has never used smokeless tobacco. She reports that she does not drink alcohol and does not use drugs.  Medications  Current Facility-Administered Medications:    apixaban (ELIQUIS) tablet 5 mg, 5 mg, Oral, BID, Carollee Leitz, MD, 5 mg at 08/05/21 1028   atorvastatin (LIPITOR) tablet 80 mg, 80 mg, Oral, QPM, Carollee Leitz, MD    FLUoxetine (PROZAC) capsule 10 mg, 10 mg, Oral, Daily, [name], Makiera, MD   gabapentin (NEURONTIN) capsule 100 mg, 100 mg, Oral, TID, Carollee Leitz, MD, 100 mg at 08/05/21 1028   insulin aspart (novoLOG) injection 0-15 Units, 0-15 Units, Subcutaneous, TID WC, Carollee Leitz, MD   levothyroxine (SYNTHROID) tablet 175 mcg, 175 mcg, Oral, QAC breakfast, Carollee Leitz, MD, 175 mcg at 08/05/21 0617   LORazepam (ATIVAN) tablet 1 mg, 1 mg, Oral, Once **OR** LORazepam (ATIVAN) injection 1 mg, 1 mg, Intramuscular, Once, Espinoza, Alejandra, DO   pantoprazole (PROTONIX) EC tablet 40 mg, 40 mg, Oral, BID, Carollee Leitz, MD, 40 mg at 08/05/21 1028  Current Outpatient Medications:    ACCU-CHEK GUIDE test strip, , Disp: , Rfl:    acetaminophen (TYLENOL) 500 MG tablet, Take 1,000 mg by mouth every 8 (eight) hours as needed for mild pain. , Disp: , Rfl:    amLODipine (NORVASC) 10 MG tablet, Take 1 tablet (10 mg total) by  mouth daily., Disp: 90 tablet, Rfl: 1   apixaban (ELIQUIS) 5 MG TABS tablet, Take 1 tablet (5 mg total) by mouth 2 (two) times daily., Disp: 60 tablet, Rfl: 9   atorvastatin (LIPITOR) 80 MG tablet, Take 1 tablet (80 mg total) by mouth every evening., Disp: 90 tablet, Rfl: 2   baclofen (LIORESAL) 10 MG tablet, Take 0.5 tablets (5 mg total) by mouth 2 (two) times daily as needed for muscle spasms., Disp: 30 each, Rfl: 0   Continuous Blood Gluc Transmit (DEXCOM G6 TRANSMITTER) MISC, by Does not apply route. Checks her blood sugar, Disp: , Rfl:    CRANBERRY EXTRACT PO, Take 1 capsule by mouth daily., Disp: , Rfl:    fexofenadine (ALLEGRA) 180 MG tablet, Take 180 mg by mouth daily as needed for allergies or rhinitis., Disp: , Rfl:    FLUoxetine (PROZAC) 10 MG capsule, TAKE ONE CAPSULE BY MOUTH EVERY DAY WITH '20MG'$  DOSE (Patient taking differently: Take 10 mg by mouth daily.), Disp: 90 capsule, Rfl: 1   FLUoxetine (PROZAC) 20 MG tablet, TAKE 1 TABLET BY MOUTH EVERY DAY ALONG WITH 10 MG FOR A TOTAL  DOSE OF 30 MG EVERY DAY (Patient taking differently: Take 20 mg by mouth daily.), Disp: 90 tablet, Rfl: 2   gabapentin (NEURONTIN) 300 MG capsule, TAKE 1 CAPSULE(300 MG) BY MOUTH THREE TIMES DAILY (Patient taking differently: Take 300 mg by mouth 3 (three) times daily.), Disp: 270 capsule, Rfl: 2   insulin aspart (NOVOLOG) 100 UNIT/ML injection, Inject 20-30 Units into the skin daily. To be used in patient's insulin pump, Disp: 10 mL, Rfl:    Insulin Human (INSULIN PUMP) SOLN, Inject 1 each into the skin continuous. Novolog insulin, Disp: 1 each, Rfl: 0   levothyroxine (SYNTHROID) 175 MCG tablet, Take 1 tablet (175 mcg total) by mouth daily before breakfast., Disp: 90 tablet, Rfl: 1   loperamide (IMODIUM A-D) 2 MG tablet, Take 1 tablet (2 mg total) by mouth 4 (four) times daily as needed for diarrhea or loose stools., Disp: 30 tablet, Rfl: 2   losartan (COZAAR) 100 MG tablet, Take 1 tablet (100 mg total) by mouth daily., Disp: 90 tablet, Rfl: 1   Multiple Vitamins-Minerals (OCUVITE PO), Take 1 tablet by mouth daily., Disp: , Rfl:    pantoprazole (PROTONIX) 40 MG tablet, Take 1 tablet (40 mg total) by mouth 2 (two) times daily., Disp: 120 tablet, Rfl: 3   potassium chloride (KLOR-CON) 10 MEQ tablet, Take 1 tablet (10 mEq total) by mouth daily., Disp: 90 tablet, Rfl: 1   torsemide (DEMADEX) 10 MG tablet, TAKE 1 TABLET(10 MG) BY MOUTH DAILY AS NEEDED (Patient taking differently: Take 10 mg by mouth daily.), Disp: 90 tablet, Rfl: 0  Exam: Current vital signs: BP (!) 108/48   Pulse (!) 102   Temp 98.4 F (36.9 C)   Resp (!) 21   SpO2 93%  Vital signs in last 24 hours: Temp:  [98.4 F (36.9 C)-98.8 F (37.1 C)] 98.4 F (36.9 C) (08/11 2346) Pulse Rate:  [57-114] 102 (08/12 0845) Resp:  [11-24] 21 (08/12 0845) BP: (106-161)/(41-104) 108/48 (08/12 0845) SpO2:  [89 %-100 %] 93 % (08/12 0845)  GENERAL: Awake, alert, in no acute distress Psych: Affect appropriate for situation, patient is calm  and cooperative with examination Head: Normocephalic and atraumatic, without obvious abnormality EENT: Edentulous. Normal conjunctivae, wears eyeglasses, dry mucous membranes, no OP obstruction LUNGS: Normal respiratory effort. Non-labored breathing on room air. SpO2 95% on room air CV: Irregular  rate and rhythm on telemetry ABDOMEN: Soft, rounded, non-tender Extremities: warm, well perfused, without obvious deformity  NEURO:  Mental Status: Awake, alert, and oriented to person, place, time, and situation. She is able to provide a clear and coherent history of present illness. Speech/Language: speech is dysarthric; at baseline per patient report Naming, repetition, fluency, and comprehension intact without aphasia  No neglect is noted Cranial Nerves:  II: PERRL. Visual fields full.  III, IV, VI: EOMI without ptosis or nystagmus  V: Sensation is intact to light touch and symmetrical to face.   VII: Face is symmetric resting and smiling.  VIII: Hearing is intact to voice IX, X: Palate elevation is symmetric. Phonation normal.  XI: Normal sternocleidomastoid and trapezius muscle strength XII: Tongue protrudes midline without fasciculations.   Motor: 4+/5 right upper extremity with mild drift, 4/5 right lower extremity with mild drift.  Left upper extremity without drift.  Left lower extremity with drift 4/5.  Tone is normal. Bulk is normal.  Sensation: Intact to light touch bilaterally in bilateral upper extremities, hyperesthesias present in left lower extremity with light touch.  Coordination: FTN intact bilaterally. UTA HKS bilaterally due to patient weakness and body habitus. No pronator drift.  DTRs: 2+ and symmetric biceps and brachioradialis, 1+ and symmetric patellae Gait: Deferred  NIHSS: 1a Level of Conscious.: 0 1b LOC Questions: 0 1c LOC Commands: 0 2 Best Gaze: 0 3 Visual: 0 4 Facial Palsy: 0 5a Motor Arm - left: 0 5b Motor Arm - Right: 1 6a Motor Leg - Left: 1 6b  Motor Leg - Right: 1 7 Limb Ataxia: 0 8 Sensory: 1 9 Best Language: 0 10 Dysarthria: 1 11 Extinct. and Inatten.: 0 TOTAL: 5  Labs I have reviewed labs in epic and the results pertinent to this consultation are: CBC    Component Value Date/Time   WBC 4.6 08/05/2021 0440   RBC 3.60 (L) 08/05/2021 0440   HGB 10.3 (L) 08/05/2021 0440   HGB 10.3 (L) 05/21/2019 0947   HCT 33.3 (L) 08/05/2021 0440   HCT 33.6 (L) 05/21/2019 0947   PLT 78 (L) 08/05/2021 0440   PLT 244 05/21/2019 0947   MCV 92.5 08/05/2021 0440   MCV 91 05/21/2019 0947   MCH 28.6 08/05/2021 0440   MCHC 30.9 08/05/2021 0440   RDW 13.0 08/05/2021 0440   RDW 14.3 05/21/2019 0947   LYMPHSABS 1.5 08/04/2021 1647   LYMPHSABS 0.9 05/21/2019 0947   MONOABS 0.6 08/04/2021 1647   EOSABS 0.1 08/04/2021 1647   EOSABS 0.0 05/21/2019 0947   BASOSABS 0.0 08/04/2021 1647   BASOSABS 0.0 05/21/2019 0947   CMP     Component Value Date/Time   NA 137 08/05/2021 0440   NA 139 06/06/2021 1127   K 4.3 08/05/2021 0557   CL 105 08/05/2021 0440   CO2 20 (L) 08/05/2021 0440   GLUCOSE 302 (H) 08/05/2021 0440   BUN 21 08/05/2021 0440   BUN 17 06/06/2021 1127   CREATININE 1.08 (H) 08/05/2021 0440   CREATININE 1.03 (H) 08/24/2015 1652   CALCIUM 8.6 (L) 08/05/2021 0440   PROT 6.0 (L) 08/04/2021 2303   PROT 5.9 (L) 03/14/2019 1129   ALBUMIN 2.8 (L) 08/04/2021 2303   ALBUMIN 3.4 (L) 03/14/2019 1129   AST 21 08/04/2021 2303   ALT 22 08/04/2021 2303   ALKPHOS 78 08/04/2021 2303   BILITOT 0.5 08/04/2021 2303   BILITOT 0.4 03/14/2019 1129   GFRNONAA 55 (L) 08/05/2021 0440   GFRAA >60 06/25/2020  1131   Lipid Panel     Component Value Date/Time   CHOL 102 08/04/2021 2313   CHOL 112 06/06/2021 1127   TRIG 39 08/04/2021 2313   HDL 44 08/04/2021 2313   HDL 50 06/06/2021 1127   CHOLHDL 2.3 08/04/2021 2313   VLDL 8 08/04/2021 2313   LDLCALC 50 08/04/2021 2313   LDLCALC 49 06/06/2021 1127   LDLDIRECT 75 01/25/2012 1508   Lab  Results  Component Value Date   HGBA1C 10.3 (H) 08/04/2021   Imaging I have reviewed the images obtained:  CT-scan of the brain 08/04/2021: No acute intracranial abnormality seen.  MRI examination of the brain ordered: 1. Small acute infarct in the left pons. No significant edema or mass effect. 2. Severe chronic microvascular ischemic disease involving the pons and left middle cerebellar peduncle with many remote lacunar infarcts, progressed from 2019. Milder chronic microvascular ischemic disease elsewhere. 3. Remote pontine microhemorrhage, potentially hypertensive.  Echocardiogram 08/05/2021: 1. Left ventricular ejection fraction, by estimation, is 60 to 65%. The left ventricle has normal function. The left ventricle has no regional wall motion abnormalities. There is mild left ventricular hypertrophy.  Left ventricular diastolic parameters are indeterminate.   2. Right ventricular systolic function is normal. The right ventricular size is normal. There is normal pulmonary artery systolic pressure.   3. The mitral valve is normal in structure. No evidence of mitral valve regurgitation. No evidence of mitral stenosis.   4. The aortic valve is tricuspid. Aortic valve regurgitation is not visualized. Mild to moderate aortic valve sclerosis/calcification is present, without any evidence of aortic stenosis.   5. The inferior vena cava is normal in size with greater than 50% respiratory variability, suggesting right atrial pressure of 3 mmHg.   Assessment: 70 y.o. female who presented to the ED for evaluation of left lower extremity weakness affecting baseline ambulation and transient dysarthria.  - Examination reveals patient with dysarthria and left lower extremity sensory deficit with hyperesthesias both at reported baseline as deficits from previous stroke. Patient endorses subjective left lower extremity weakness but on assessment has more of subtle right-sided weakness-which the son at  bedside reports is something new. - CTH was obtained without acute intracranial abnormality. MRI reveals a small acute infarction in the left pons with severe chronic microvascular ischemic disease involving the pons and left middle cerebellar peduncle with many remote lacunar infarcts. Also, there is evidence of a remote pontine microhemorrhage thought to be secondary to hypertension. - Stroke risk factors include patient's advanced age, paroxysmal AF, history of stroke, HTN, HLD, obesity, intracranial atherosclerosis, and type 1 diabetes mellitus.    Impression: New left pontine stroke with right-sided hemiparesis-likely small vessel etiology History of CVA with left-sided weakness and dysarthria   Recommendations: - MRI brain without contrast; will expand stroke workup at that time if acute findings  - HgbA1c above goal of < 7% - MRA head without contrast and carotid Dopplers-pending - Fasting lipid panel, LDL at goal of < 70 continue home statin therapy  - Frequent neuro checks - Prophylactic therapy: Currently on Eliquis-I would recommend continuing Eliquis due to the small size of the stroke.  There always is a risk of hemorrhagic transformation with anticoagulation in early part of the stroke but she is someone who also has paroxysmal atrial fibrillation and I would hate to discontinue her anticoagulation unless the stroke was very large. - Risk factor modification - Telemetry monitoring - PT consult, OT consult, Speech consult - Stroke team to follow  head and neck vessel imaging with you.  Pt seen by NP/Neuro and later by MD. Note/plan to be edited by MD as needed.  Anibal Henderson, AGAC-NP [facility] Pager: [phone]   Attending Neurohospitalist Addendum Patient seen and examined with APP/Resident. Agree with the history and physical as documented above. Agree with the plan as documented, which I helped formulate. I have independently reviewed the chart,  obtained history, review of systems and examined the patient.I have personally reviewed pertinent head/neck/spine imaging (CT/MRI). Please feel free to call with any questions.  -- Amie Portland, MD Neurologist [facility] Pager: [phone]

## 2021-08-05 NOTE — ED Notes (Addendum)
This RN spoke with Dr. Ardelia Mems about pt's CBG being 461. Pt stated she has an insulin pump that was taken off by staff per Dr. Volanda Napoleon. Per Dr. Ardelia Mems give pt 5 units of novolog and then get pt's insulin pump placed back on asap. This RN will help pt reapply her insulin pump and will continue to monitor.

## 2021-08-05 NOTE — ED Notes (Signed)
Pt was In&out cath after no urine output today. I was able to drain 865ms. MD notified and cath removed

## 2021-08-05 NOTE — Progress Notes (Addendum)
Family Medicine Teaching Service Daily Progress Note Intern Pager: [phone]  Patient name: [name] Medical record number: [id number] Date of birth: 12-03-1951 Age: 70 y.o. Gender: female  Primary Care Provider: Eulis Foster, MD Consultants: Neuro Code Status: Full  Pt Overview and Major Events to Date:  8/11- admitted  Assessment and Plan: [name] is a 70 yo female presenting with L sided weakness. History significant for T1DM, prior CVA with residual L sided deficits, A Fib on Eliquis s/p pacemaker, HTN, HLD, morbid obesity, OSA, HFpEF, GERD, hx thyroid CA s/p total thyroidectomy.  L Sided Weakness Prior CVA with residual L sided defivits. CT yesterday without evidence of acute intracranial process. MRI Brain pending this am.  Patient reports claustrophobia and history of needing anxiolytics prior to MRI. -Ativan 1 mg prior to MRI - Follow-up MRI Brain - Neuro following, appreciate recs -Orthostatic vitals  Dyspnea OSA  HFpEF  CXR yesterday with cardiomegaly with central vascular prominence but no overt pulmonary edema.  Hazy opacity overlying the left lung favored represent overlying atelectasis but infiltrate not excluded. Pattern of dyspnea (worse when leaning forward) taken together with CXR and 1+ pitting edema on exam suggests HF etiology. Home diuretic is torsemide '10mg'$  daily.  We have been holding off on increasing her torsemide as her blood pressures have been soft (MAP 60s). Low concern for PE as she has been on Eliquis and is not hypoxic.   - 2 view CXR to better assess infiltrates - Echo today - BiPAP at night, which she uses at home -Ambulate with pulse ox  A Fib Hx Complete AV Block s/p pacemaker In A. fib on monitor, rate controlled. - Continue Eliquis   ?Urinary Retention Patient reports she has not voided since 3p yesterday. - Bladder scan - Possible in and out if bladder scan shows retention  T1DM A1cc 10.3. Glucoses  initally well-controlled here (98-160), however, once patient's insulin pump was turned off overnight glucose rose to 302 and then 461.  -Reapply insulin pump -Diabetes coordinator to see - BMP to ensure no DKA  Depression Takes Prozac '30mg'$  daily.  -Restart home Prozac  Chronic Low Back Pain Takes gabapentin and Baclofen at home. Holding baclofen as may be contributing to her weakness.  - Gabapentin at reduced dose of '100mg'$  TID   L Heel Ulcer Unstageable ulcer of left foot. -Prevalon boot per WOC  AKI, improving Cr 1.28>1.08.  S/p thyroidectomy - Continue home Levothyroxine 144mg   HLD, chronic, stable -Continue Lipitor '80mg'$  daily  GERD, chronic, stable - Continue Protonix '40mg'$  BID  FEN/GI: Heart healthy, carb modified PPx: On full dose Eliquis Dispo:Pending PT recommendations   pending further workup . Barriers include MRI today.   Subjective:  Ms. [name] Genschreports feeling overall well this morning.  It seems that her main issue at this time is dyspnea which is worst with leaning forward.  Objective: Temp:  [98.4 F (36.9 C)-98.8 F (37.1 C)] 98.4 F (36.9 C) (08/11 2346) Pulse Rate:  [57-114] 102 (08/12 0845) Resp:  [11-24] 21 (08/12 0845) BP: (106-161)/(41-104) 108/48 (08/12 0845) SpO2:  [89 %-100 %] 93 % (08/12 0845) Physical Exam: General: Comfortable appearing, lying in ED stretcher Cardiovascular: Irregularly irregular, no JVD Respiratory: Clear to auscultation but exam limited by body habitus Abdomen: Soft, nontender, nondistended Extremities: Trace pitting edema of bilateral lower extremities, left with unstageable ulcer heel Neuro: CN II-XII intact, speech somewhat dysarthric, LLE 4/5 strength, RLE 5/5, BUE with 5/5 strength. No deficit to sensation.  Finger to nose intact  Laboratory: Recent Labs  Lab 08/04/21 1647 08/05/21 0440  WBC 4.5 4.6  HGB 11.3* 10.3*  HCT 36.0 33.3*  PLT 173 78*   Recent Labs  Lab 08/04/21 1647  08/04/21 2303 08/05/21 0440 08/05/21 0557  NA 140  --  137  --   K 3.8  --  5.8* 4.3  CL 106  --  105  --   CO2 26  --  20*  --   BUN 20  --  21  --   CREATININE 1.28*  --  1.08*  --   CALCIUM 9.0  --  8.6*  --   PROT 6.7 6.0*  --   --   BILITOT 0.8 0.5  --   --   ALKPHOS 91 78  --   --   ALT 24 22  --   --   AST 27 21  --   --   GLUCOSE 152*  --  302*  --     Imaging/Diagnostic Tests: 2 view chest x-ray pending MRI brain pending-  Eppie Gibson, MD 08/05/2021, 9:33 AM PGY-1, [place] Intern pager: [phone], text pages welcome

## 2021-08-05 NOTE — ED Notes (Signed)
Pharmacy to come look at pts insulin pump to assist in turning off

## 2021-08-05 NOTE — Consult Note (Signed)
WOC Nurse Consult Note: Patient receiving care in [facility] ED 27. Reason for Consult: left heel and right shin wounds Wound type: left heel is an unstageable PI. Right shin is a fluid filled bulla. Pressure Injury POA: Yes Measurement: To be provided by the bedside RN in the flowsheet section  Wound bed: see photos of areas Drainage (amount, consistency, odor) none Periwound: intact Dressing procedure/placement/frequency: Apply iodine from the swabsticks or swab pads from clean utility to left heel ulcer.  Allow to air dry. Then place foot into Prevalon heel lift boot.   I have placed an order for a prevalon heel lift boot for each foot.  For the right pretibial fluid filled bulla I have ordered Xeroform gauze Kellie Simmering [phone]) and a few turns of kerlix. Dressing to be moistened prior to removal.  Monitor the wound area(s) for worsening of condition such as: Signs/symptoms of infection,  Increase in size,  Development of or worsening of odor, Development of pain, or increased pain at the affected locations.  Notify the medical team if any of these develop.  [place] nurse will not follow at this time.  Please re-consult the [place] team if needed.  Val Riles, RN, MSN, CWOCN, CNS-BC, pager [phone]

## 2021-08-05 NOTE — Progress Notes (Signed)
  Echocardiogram 2D Echocardiogram has been performed.  [name] 08/05/2021, 12:43 PM

## 2021-08-05 NOTE — Progress Notes (Signed)
Inpatient Diabetes Program Recommendations  AACE/ADA: New Consensus Statement on Inpatient Glycemic Control (2015)  Target Ranges:  Prepandial:   less than 140 mg/dL      Peak postprandial:   less than 180 mg/dL (1-2 hours)      Critically ill patients:  140 - 180 mg/dL   Lab Results  Component Value Date   GLUCAP 461 (H) 08/05/2021   HGBA1C 10.3 (H) 08/04/2021    Review of Glycemic Control Results for Sobotka, [name] ETTA MORRISON "ROSETTA" (MRN [id number]) as of 08/05/2021 11:28  Ref. Range 08/05/2021 08:25  Glucose-Capillary Latest Ref Range: 70 - 99 mg/dL 461 (H)  Results for Wolf, [name] ETTA MORRISON "ROSETTA" (MRN [id number]) as of 08/05/2021 11:28  Ref. Range 08/04/2021 16:47 08/05/2021 04:40  Glucose Latest Ref Range: 70 - 99 mg/dL 152 (H) 302 (H)   Diabetes history: DM1 Outpatient Diabetes medications: 630 Medtronic insulin pump Current orders for Inpatient glycemic control: Novolog 0-15 units tid started 0800 am today, Restarted insulin pump  Inpatient Diabetes Program Recommendations:   -D/C Novolog correction -Add insulin pump order set   Spoke with patient @ bedside regarding diabetes management. Patient stated " I tried to tell them I was type 1 and needed insulin". Discussed with RN patient may need BMET to review if in DKA. RN contacted admitting and requested BMET to be drawn. Patient sees endocrinologist [name] for her diabetes and last noted visit was 05/17/21. Patient's insulin pump in back on after being off over night and patient is alert and able to manage her own insulin pump. Settings on pump: 12 a-3a-0.5 units/hr 3a-9a 0.8 units/hr 9a-12n  1.3 units/hr 12n-1:30p 0.95 units/hr 1:30p-3p 1.2 units/hr 3p-6 p 1.10 units/hr 6p-7:30p 1.15 units/hr 7:30-12 a 0.95 units/hr Total basal = 22.7 units daily  Patient is also wearing Dexcom sensor and is intact on abdomen.  Assured patient she communicated appropriately regarding her diabetes and will follow through  hospitalization.  Thank you, Nani Gasser. Fredericka Bottcher, RN, MSN, CDE  Diabetes Coordinator Inpatient Glycemic Control Team Team Pager [phone] (8am-5pm) 08/05/2021 11:39 AM

## 2021-08-05 NOTE — ED Notes (Signed)
Pt reports that insulin pump came back on - pt suspended it again - pt is not sure how to turn off pump completley - MD Ardelia Mems made aware

## 2021-08-05 NOTE — ED Notes (Signed)
Bladder scan complete, 414 mL present in bladder.

## 2021-08-05 NOTE — ED Notes (Signed)
Report called to [place]

## 2021-08-05 NOTE — ED Notes (Signed)
Bladder scan 414

## 2021-08-05 NOTE — ED Notes (Signed)
Pt in bed resting, family at bedside. PT finished with lunch tray. Medicated as ordered and pump supplies picked up from front desk from family

## 2021-08-05 NOTE — ED Notes (Signed)
Per Dr. Volanda Napoleon remove pt insulin pump completley - Pt disconnected tubing and pump, site still there. Pt pump placed in bag with belongings

## 2021-08-05 NOTE — ED Notes (Signed)
Patient transported to MRI 

## 2021-08-05 NOTE — ED Notes (Signed)
Pt and family upset about the patients sensor being thrown away last night. They removed her pump for MRI and now she is missing pieces. Talked with pt and she was able to reorder a new one. Pt still is wearing her pump but we will need to check glucose

## 2021-08-06 ENCOUNTER — Encounter (HOSPITAL_COMMUNITY): Payer: Self-pay | Admitting: Student

## 2021-08-06 ENCOUNTER — Inpatient Hospital Stay (HOSPITAL_COMMUNITY): Payer: Medicare PPO

## 2021-08-06 DIAGNOSIS — I639 Cerebral infarction, unspecified: Secondary | ICD-10-CM | POA: Diagnosis not present

## 2021-08-06 DIAGNOSIS — I69354 Hemiplegia and hemiparesis following cerebral infarction affecting left non-dominant side: Secondary | ICD-10-CM

## 2021-08-06 DIAGNOSIS — Z9641 Presence of insulin pump (external) (internal): Secondary | ICD-10-CM

## 2021-08-06 DIAGNOSIS — I480 Paroxysmal atrial fibrillation: Secondary | ICD-10-CM

## 2021-08-06 DIAGNOSIS — E1159 Type 2 diabetes mellitus with other circulatory complications: Secondary | ICD-10-CM | POA: Diagnosis not present

## 2021-08-06 DIAGNOSIS — E1059 Type 1 diabetes mellitus with other circulatory complications: Secondary | ICD-10-CM

## 2021-08-06 DIAGNOSIS — Z6841 Body Mass Index (BMI) 40.0 and over, adult: Secondary | ICD-10-CM

## 2021-08-06 DIAGNOSIS — G4733 Obstructive sleep apnea (adult) (pediatric): Secondary | ICD-10-CM

## 2021-08-06 DIAGNOSIS — I152 Hypertension secondary to endocrine disorders: Secondary | ICD-10-CM

## 2021-08-06 HISTORY — DX: Cerebral infarction, unspecified: I63.9

## 2021-08-06 LAB — CBC
HCT: 34.8 % — ABNORMAL LOW (ref 36.0–46.0)
Hemoglobin: 11.1 g/dL — ABNORMAL LOW (ref 12.0–15.0)
MCH: 28.7 pg (ref 26.0–34.0)
MCHC: 31.9 g/dL (ref 30.0–36.0)
MCV: 89.9 fL (ref 80.0–100.0)
Platelets: 167 10*3/uL (ref 150–400)
RBC: 3.87 MIL/uL (ref 3.87–5.11)
RDW: 12.9 % (ref 11.5–15.5)
WBC: 5.7 10*3/uL (ref 4.0–10.5)
nRBC: 0 % (ref 0.0–0.2)

## 2021-08-06 LAB — BASIC METABOLIC PANEL
Anion gap: 6 (ref 5–15)
BUN: 21 mg/dL (ref 8–23)
CO2: 26 mmol/L (ref 22–32)
Calcium: 8.8 mg/dL — ABNORMAL LOW (ref 8.9–10.3)
Chloride: 108 mmol/L (ref 98–111)
Creatinine, Ser: 1.06 mg/dL — ABNORMAL HIGH (ref 0.44–1.00)
GFR, Estimated: 57 mL/min — ABNORMAL LOW (ref >60)
Glucose, Bld: 67 mg/dL — ABNORMAL LOW (ref 70–99)
Potassium: 3.7 mmol/L (ref 3.5–5.1)
Sodium: 140 mmol/L (ref 135–145)

## 2021-08-06 LAB — GLUCOSE, CAPILLARY
Glucose-Capillary: 61 mg/dL — ABNORMAL LOW (ref 70–99)
Glucose-Capillary: 63 mg/dL — ABNORMAL LOW (ref 70–99)
Glucose-Capillary: 85 mg/dL (ref 70–99)
Glucose-Capillary: 218 mg/dL — ABNORMAL HIGH (ref 70–99)
Glucose-Capillary: 219 mg/dL — ABNORMAL HIGH (ref 70–99)
Glucose-Capillary: 229 mg/dL — ABNORMAL HIGH (ref 70–99)
Glucose-Capillary: 155 mg/dL — ABNORMAL HIGH (ref 70–99)

## 2021-08-06 LAB — BLOOD GAS, ARTERIAL
Acid-Base Excess: 1.2 mmol/L (ref 0.0–2.0)
Bicarbonate: 25 mmol/L (ref 20.0–28.0)
FIO2: 28
O2 Saturation: 99.1 %
Patient temperature: 37
pCO2 arterial: 38.2 mmHg (ref 32.0–48.0)
pH, Arterial: 7.432 (ref 7.350–7.450)
pO2, Arterial: 156 mmHg — ABNORMAL HIGH (ref 83.0–108.0)

## 2021-08-06 MED ORDER — POLYETHYLENE GLYCOL 3350 17 G PO PACK
17.0000 g | PACK | Freq: Every day | ORAL | Status: DC
  Administered 2021-08-06 – 2021-08-10 (×5): 17 g via ORAL
  Filled 2021-08-06 (×5): qty 1

## 2021-08-06 MED ORDER — SENNA 8.6 MG PO TABS
1.0000 | ORAL_TABLET | Freq: Every day | ORAL | Status: DC
  Administered 2021-08-06 – 2021-08-09 (×4): 8.6 mg via ORAL
  Filled 2021-08-06 (×5): qty 1

## 2021-08-06 NOTE — Plan of Care (Signed)
Goal is for patient to have normal BG levels

## 2021-08-06 NOTE — Progress Notes (Addendum)
Sent message to Dr Owens Shark that bladder scan volume is 267m and patient has not voided since arrival to unit. Sent second message to inform that patient has become drowsy again.

## 2021-08-06 NOTE — Progress Notes (Signed)
Patient arrived to unit with electric wheelchair and charger to chair, glucometer, 3 packs of strips, and needle in black pouch. Insulin pump also implanted on RLQ of abdomen. Black cell phone with white Games developer

## 2021-08-06 NOTE — Progress Notes (Signed)
FPTS Interim Night Progress Note  S:Patient much more alert and now oriented x4.  Able to recall events that happened from earlier today.  Reports had never taken Ativan before.  Can recall MD's and RN being at bedside earlier to arouse her.  Glucose has now increased to 83 from 61 after po intake.  Oxygen was removed and saturations maintained > 95%.  Recent ABG wnl.   Rounded with primary night RN.  No concerns voiced.  No orders required.    O: Today's Vitals   08/05/21 1957 08/05/21 2157 08/06/21 0034 08/06/21 0428  BP: (!) 128/48 (!) 137/51 (!) 127/51 (!) 126/46  Pulse: 65 63 60 68  Resp: 18 20 (!) 22 (!) 22  Temp: 98.1 F (36.7 C) 98.8 F (37.1 C) 98.6 F (37 C) 98.4 F (36.9 C)  TempSrc: Oral Oral Axillary Oral  SpO2: 98% 95% 98% 92%  PainSc:          A/P: Continue to monitor mentation Continue to monitor CBG  Carollee Leitz MD PGY-3, [facility] pager [phone]

## 2021-08-06 NOTE — Progress Notes (Signed)
FPTS Interim Progress Note  S:Patient sleeping and resting comfortably.  Rounded with primary RN.  No concerns voiced.  No orders required.  Appreciated nightly round.  O: BP (!) 145/51 (BP Location: Right Arm)   Pulse 61   Temp 98.4 F (36.9 C) (Oral)   Resp 20   Ht '5\' 7"'$  (1.702 m)   Wt 125.3 kg   SpO2 98%   BMI 43.26 kg/m    RESP: equal chest rise and fall, CPAP at bedside and not on patient at this time  A/P:  See daily progress note.  Place CPAP on patient.    Lyndee Hensen, DO PGY-3, [facility] pager [phone]

## 2021-08-06 NOTE — Progress Notes (Signed)
Informed MD at bedside that patient has insulin pump implanted.

## 2021-08-06 NOTE — Progress Notes (Addendum)
STROKE TEAM PROGRESS NOTE   Patient ID: [name] is a 70 year old female with a significant cardiovascular and stroke past medical history presented to [name], ED with complaints of being unable to stand, and slurred speech.  NIH stroke scale of 5.  No acute abnormality seen on CT, small acute infarct in the left pons on MRI.  Currently has mild right-sided weakness compared to the left.  SUBJECTIVE: [name] is eating upright in bed on my visit.  She does not have any complaints at this time except for a bandaged blister on the right ankle which she states is painful.    PERTINENT IMAGING: CT-scan of the brain 08/04/2021: No acute intracranial abnormality seen.   MRI BRAIN 1. Small acute infarct in the left pons. No significant edema or mass effect. 2. Severe chronic microvascular ischemic disease involving the pons and left middle cerebellar peduncle with many remote lacunar infarcts, progressed from 2019. Milder chronic microvascular ischemic disease elsewhere. 3. Remote pontine microhemorrhage, potentially hypertensive.   Echocardiogram 08/05/2021: 1. Left ventricular ejection fraction, by estimation, is 60 to 65%. The left ventricle has normal function. The left ventricle has no regional wall motion abnormalities. There is mild left ventricular hypertrophy.  Left ventricular diastolic parameters are indeterminate.   2. Right ventricular systolic function is normal. The right ventricular size is normal. There is normal pulmonary artery systolic pressure.   3. The mitral valve is normal in structure. No evidence of mitral valve regurgitation. No evidence of mitral stenosis.   4. The aortic valve is tricuspid. Aortic valve regurgitation is not visualized. Mild to moderate aortic valve sclerosis/calcification is present, without any evidence of aortic stenosis.   5. The inferior vena cava is normal in size with greater than 50% respiratory variability, suggesting right  atrial pressure of 3 mmHg.   OBJECTIVE Vitals:   08/06/21 0800 08/06/21 0900 08/06/21 1200 08/06/21 1635  BP: (!) 148/50 (!) 153/61 (!) 139/57 (!) 150/61  Pulse: 61  63 64  Resp: '20  19 20  '$ Temp: 98.6 F (37 C)  98.5 F (36.9 C) 99 F (37.2 C)  TempSrc: Axillary  Oral Axillary  SpO2: 95%  97% 100%  Weight:      Height:        CBC:  Recent Labs  Lab 08/04/21 1647 08/05/21 0440 08/06/21 0102  WBC 4.5 4.6 5.7  NEUTROABS 2.3  --   --   HGB 11.3* 10.3* 11.1*  HCT 36.0 33.3* 34.8*  MCV 92.3 92.5 89.9  PLT 173 78* [id number]    Basic Metabolic Panel:  Recent Labs  Lab 08/04/21 2303 08/05/21 0440 08/05/21 1238 08/06/21 0102  NA  --    < > 139 140  K  --    < > 4.0 3.7  CL  --    < > 107 108  CO2  --    < > 25 26  GLUCOSE  --    < > 252* 67*  BUN  --    < > 24* 21  CREATININE  --    < > 1.25* 1.06*  CALCIUM  --    < > 8.9 8.8*  MG 1.8  --   --   --    < > = values in this interval not displayed.    Lipid Panel:  Recent Labs  Lab 08/04/21 2313  CHOL 102  TRIG 39  HDL 44  CHOLHDL 2.3  VLDL 8  LDLCALC 50  HgbA1c:  Lab Results  Component Value Date   HGBA1C 10.3 (H) 08/04/2021   Urine Drug Screen:     Component Value Date/Time   LABOPIA NONE DETECTED 03/07/2020 1704   COCAINSCRNUR NONE DETECTED 03/07/2020 1704   LABBENZ NONE DETECTED 03/07/2020 1704   AMPHETMU NONE DETECTED 03/07/2020 1704   THCU NONE DETECTED 03/07/2020 1704   LABBARB NONE DETECTED 03/07/2020 1704    Alcohol Level No results found for: [facility]  IMAGING  Results for orders placed or performed during the hospital encounter of 08/04/21  MR Brain W and Wo Contrast   Narrative   CLINICAL DATA:  Neuro deficit, acute, stroke suspected  EXAM: MRI HEAD WITHOUT AND WITH CONTRAST  TECHNIQUE: Multiplanar, multiecho pulse sequences of the brain and surrounding structures were obtained without and with intravenous contrast.  CONTRAST:  20m GADAVIST GADOBUTROL 1 MMOL/ML IV SOLN  COMPARISON:   CT head 08/04/2021.  MRI October 14, 2018.  FINDINGS: Brain: Small acute infarct in the left pons. No significant edema or mass effect. Prominent T2/FLAIR hyperintensity of the pons, likely representing severe microvascular ischemic change with multiple small remote pontine lacunar infarcts. Additional T2/FLAIR hyperintensity of the left middle cerebellar peduncle, likely representing Wallerian type degeneration with focal t2 hyperintensity in this region likely an additional remote lacunar infarct. Milder supratentorial chronic microvascular ischemic disease with scattered white matter T2 hyperintensities. No hydrocephalus. No acute hemorrhage. No extra-axial fluid collections. No midline shift. No abnormal enhancement. Punctate focus of susceptibility artifact in the right pons, compatible with prior microhemorrhage.  Vascular: Major arterial flow voids are maintained at the skull base.  Skull and upper cervical spine: Normal marrow signal.  Sinuses/Orbits: Clear visualized sinuses.  Unremarkable orbits.  Other: No sizable mastoid effusions.  IMPRESSION: 1. Small acute infarct in the left pons. No significant edema or mass effect. 2. Severe chronic microvascular ischemic disease involving the pons and left middle cerebellar peduncle with many remote lacunar infarcts, progressed from 2019. Milder chronic microvascular ischemic disease elsewhere. 3. Remote pontine microhemorrhage, potentially hypertensive.   Electronically Signed   By: FMargaretha SheffieldM.D.   On: 08/05/2021 14:33   CT HEAD WO CONTRAST (5MM)   Narrative   CLINICAL DATA:  Acute left lower extremity weakness.  EXAM: CT HEAD WITHOUT CONTRAST  TECHNIQUE: Contiguous axial images were obtained from the base of the skull through the vertex without intravenous contrast.  COMPARISON:  March 07, 2020.  FINDINGS: Brain: No evidence of acute infarction, hemorrhage, hydrocephalus, extra-axial collection or  mass lesion/mass effect.  Vascular: No hyperdense vessel or unexpected calcification.  Skull: Normal. Negative for fracture or focal lesion.  Sinuses/Orbits: No acute finding.  Other: None.  IMPRESSION: No acute intracranial abnormality seen.   Electronically Signed   By: JMarijo ConceptionM.D.   On: 08/04/2021 18:08   Results for orders placed or performed during the hospital encounter of 10/13/18  MR BRAIN WO CONTRAST   Narrative   CLINICAL DATA:  Follow up stroke. History of stroke, hyperlipidemia, hypertension, diabetes and thyroid cancer.  EXAM: MRI HEAD WITHOUT CONTRAST  TECHNIQUE: Multiplanar, multiecho pulse sequences of the brain and surrounding structures were obtained without intravenous contrast.  COMPARISON:  CT HEAD October 14, 2018 and MRI head August 29, 2015  FINDINGS: Multiple sequences are moderately motion degraded.  INTRACRANIAL CONTENTS: 6 mm acute RIGHT pontine infarct at superior cerebellar peduncle with low ADC values. A few scattered chronic microhemorrhages noted. Patchy supratentorial white matter T2 hyperintensities. Old pontine lacunar infarcts. Prominent  basal ganglia perivascular spaces associated with chronic small vessel ischemic changes. No parenchymal brain volume loss for age. No midline shift, mass effect or masses. No abnormal extra-axial fluid collections. Basal cisterns are patent.  VASCULAR: Normal major intracranial vascular flow voids present at skull base.  SKULL AND UPPER CERVICAL SPINE: No abnormal sellar expansion. No suspicious calvarial bone marrow signal. Craniocervical junction maintained.  SINUSES/ORBITS: The mastoid air-cells and included paranasal sinuses are well-aerated.The included ocular globes and orbital contents are non-suspicious. Status post bilateral ocular lens implants.  OTHER: Patient is edentulous.  IMPRESSION: 1. Motion degraded examination. Acute subcentimeter RIGHT pontine infarct. 2.  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   Electronically Signed   By: Elon Alas M.D.   On: 10/14/2018 20:29     ROS:                                                                                                                                       History obtained from chart review and the patient  General: Negative for: Chills, fatigue, fever Psychological: Negative for: Hallucinations, mood swings Ophthalmic: Negative for: Blurry vision, double vision ENT: Negative for: vertigo Respiratory: Negative for: Cough,  shortness of breath Cardiovascular: Negative for: Chest pain Gastrointestinal: Negative for: Abdominal pain, diarrhea, nausea/vomiting Musculoskeletal: Negative for: Joint swelling or muscular weakness; pain on right ankle AS noted in the HPI  Neurological: As noted in HPI   General Exam__________________________________________________________   HEENT-  Normocephalic, no lesions, without obvious abnormality.  Normal external eye and conjunctiva.  Wears glasses.  Mostly edentulous. Cardiovascular-regular rate and rhythm, on telemetry Lungs-breathing comfortably on room air Extremities-approximately 5 inch bandage wrapping of the right ankle Musculoskeletal-no joint tenderness, deformity or swelling Skin-visible skin warm and dry, no hyperpigmentation, vitiligo, or suspicious lesions  Neurologic Exam:_______________________________________________________ General: NAD Mental Status: Alert, oriented, thought content appropriate.  Speech is dysarthric (baseline) fluent without evidence of aphasia.  Able to follow 3 step commands without difficulty. Cranial Nerves: II:  Visual fields grossly normal, pupils equal, round and reactive to light III,IV, VI: Ptosis not present, extra-ocular motions intact bilaterally V,VII: Smile symmetric, facial light touch sensation normal bilaterally VIII: Hearing normal bilaterally IX,X:  Phonation normal XI: Shoulder shrug with equal strength bilaterally XII: Midline tongue extension without atrophy or fasciculations  Motor: Right : Upper extremity   4/5    Left:     Upper extremity   5/5  Lower extremity   4/5     Lower extremity   5/5 Lower extremity movement is at the ankle, unable to move the knee or hip off the bed secondary to body habitus and weakness, 2-3/5 bilaterally Tone and Bulk: Normal throughout; no atrophy noted Sensory: Light touch intact throughout, equal bilaterally. Patient does not endorse hyperesthesias on my visit. Deep Tendon Reflexes:  Right: Upper Extremity:   Biceps (C-5 to C-6) 2/4  Brachioradialis (C6) 1/4   Left Upper Extremity: Biceps (C-5 to C-6) 2/4 Brachioradialis (C6) 1/4  Right Lower Extremity Quadriceps (L-2 to L-4) 1/4     Left Lower Extremity Quadriceps (L-2 to L-4) 1/4      Plantars: Downgoing bilaterally Cerebellar: FTN without evidence of ataxia or dysmetria Gait: Deferred, patient uses an electric wheelchair for transportation with the ability to transfer and ambulate a few steps at a time  ASSESSMENT/PLAN [name] is a 70 y.o. 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 fib on Eliquis, hyperlipidemia, hypertension, intracranial vascular stenosis, type I DM, thyroid cancer status post total thyroidectomy, complete heart block status post PPM, obesity and heart failure preserved ejection fraction who presented to [name], ED with complaints of being unable to stand, and slurred speech.  She believes she was having a stroke at the time. NIH stroke scale of 5.  No acute abnormality seen on CT, small acute infarct in the left pons on MRI.  Consistent with previous examinations, I find 4 out of 5 strength in the right upper and lower extremity versus the left, which is  inconsistent with patient report.  This similar to previous assessments the patient did not appreciate hyperesthesia of the lower extremities on exam.    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 Yes; Fluid consistency: Thin  Diet effective now                  Eliquis (apixaban) daily prior to admission, continue Ongoing aggressive stroke risk factor management Therapy recommendations: PT recommends CIR Disposition: To be determined when medically appropriate  Hypertension Continue home medications Permissive hypertension (OK if < 220/120) but gradually normalize in 5-7 days Long-term BP goal normotensive  Hyperlipidemia Home meds: Atorvastatin 80 mg, continued in hospital LDL 50, goal < 70 Continue statin at discharge  Diabetes type II HgbA1c 10.3, goal < 7.0 Continue sliding scale insulin in the hospital.  Other Stroke Risk Factors Advanced age Obesity, Body mass index is 43.26 kg/m., recommend weight loss, diet and exercise as appropriate  Hx stroke/TIA Coronary artery disease  Hospital day # 2  Solon Augusta, PhD, PA-C Neurology, Stroke Team  ATTENDING ATTESTATION:  Dr. Reeves Forth evaluated pt independently, reviewed imaging, chart, labs. Discussed and formulated plan with the APP. Please see APP note above for details.   Total 30 minutes spent on counseling patient and coordinating care, writing notes and reviewing chart.  Left pontine stroke on MRI. Work up pending.   Yolanda Huffstetler,MD   To contact Stroke Continuity provider, please refer to [email or web address] After hours, contact General  Neurology

## 2021-08-06 NOTE — Progress Notes (Signed)
   08/06/21 0036  BiPAP/CPAP/SIPAP  $ Non-Invasive Ventilator  Non-Invasive Vent Subsequent  $ Non-Invasive Home Ventilator  Subsequent  BiPAP/CPAP/SIPAP Pt Type Adult  Mask Type Full face mask  Mask Size Medium  Respiratory Rate 17 breaths/min  IPAP 10 cmH20  EPAP 5 cmH2O  Flow Rate 3 lpm  BiPAP/CPAP/SIPAP CPAP  Placed pt. On cpap per md order pt. Is very sleepy and not following commands abg obtained

## 2021-08-06 NOTE — Progress Notes (Signed)
Patient took her blood sugar with personal meter. Reading 152.

## 2021-08-06 NOTE — Progress Notes (Signed)
Received verbal order from Dr. Owens Shark to do Q2hr neuro checks.

## 2021-08-06 NOTE — Progress Notes (Signed)
Inpatient Rehab Admissions Coordinator Note:   Per PT/OT recommendations, pt was screened for CIR candidacy by Gayland Curry, MS, CCC-SLP.  At this time we are recommending an inpatient rehab consult.  Please place an IP Rehab MD consult order if pt would like to be considered.  Please contact me with questions.    Gayland Curry, [place], [place] Admissions Coordinator [phone] 08/06/21 5:48 PM

## 2021-08-06 NOTE — Progress Notes (Signed)
Sent message that patient has a blood glucose of 63

## 2021-08-06 NOTE — Progress Notes (Addendum)
Family Medicine Teaching Service Daily Progress Note Intern Pager: [phone]  Patient name: [name] Medical record number: [id number] Date of birth: 10/09/1951 Age: 70 y.o. Gender: female  Primary Care Provider: Eulis Foster, MD Consultants: Neurology Code Status: FULL  Pt Overview and Major Events to Date:  8/11: Admitted, CT Head negative  8/12: MRI brain notable for acute infarct left pons   Assessment and Plan: [name] is a 70 y.o. F who presented with left-sided lower extremity weakness and transient dysarthria and found to have an acute infarct in left pons.   Acute CVA Left Pons Visualized on MRI yesterday.  Carotid dopplers with 1-39% stenosis bilterally. Patient appears well today, she believes that her speech is back to baseline. Mild dysmetria with finger-nose-finger, and unable to do heel to shin but cranial nerves intact. Speech appears mildly slurred still but is missing her top dentures. BP's are normotensive to soft, ranging 103-148/36-52. Appreciate any recommendations from Neurology and PT/OT for disposition planning.  -Neuro following, appreciate care and recommendations -PT/OT eval and treat -Continue Eliquis -Continue Lipitor 80 mg daily  -Monitor vitals -Continue Neuro checks  OSA  HFpEF Dyspnea has improved. On BiPAP at night. Echo with EF 60-65%, mild LVH. -Ambulate with pulse ox prior to d/c if able to tolerate -Orthostatics prior to d/c if able to stand  Constipation Reports no bowel movement in 2 days.  Would like laxative/stool softener. -MiraLAX 17 g daily -Senna 1 tablet daily  Possible Urinary Retention Had 800cc on in and out cath. Reports that she still has not been able to void. In discussion with RN, patient does have the urge and has tried a couple times unsuccessfully. Bladder scan this AM with <300cc urine.  -Continue bladder scans q8h -In and out if >300 cc -Strict I/O  AKI:  improved Baseline creatinine around 1, patient is 1.06 today. She was eating and drinking while in the room.  -Continue to encourage PO intake  Type 1 DM On insulin pump. Had one hypoglycemic episode at 1AM with CBG 63. She improved and CBG's now 200's. Hgb A1c 10.3. Goal <7%.  -Consult to diabetes educator   Atrial Fibrillation  Hx Complete AV Block s/p Pacemaker  -Continue Eliquis -Telemetry  Depression On home Prozac. This is on [facility] list of medications. Will continue for now but would recommend potential switch outpatient.  -Continue Prozac  Acquired Hypothyroidism S/p thyroidectomy -Continue Synthroid   GERD: Chronic, stable -Continue Protonix 40 mg BID   FEN/GI: Heart healthy, carb modified PPx: Eliquis Dispo:Home with home health   1-2 days . Barriers include PT/OT evaluation, ensure she is voiding adequately, Neurology recommendations.   Subjective:  Patient feels well this morning. States that she felt out of it yesterday- thought it may have been the medication.  She thinks her speech is back to her normal.  She thinks the weakness in her legs is getting better.  She states that she lives with her sister who is "old like me" and her granddaughter who is 12.  She is a person that primarily manages the household. She would like to return home when appropriate. She is wondering why she had a recurrent stroke since she takes all of her medications.  Objective: Temp:  [97.9 F (36.6 C)-98.8 F (37.1 C)] 98.3 F (36.8 C) (08/13 0643) Pulse Rate:  [60-102] 66 (08/13 0643) Resp:  [14-22] 22 (08/13 0643) BP: (103-137)/(36-52) 136/47 (08/13 0643) SpO2:  [92 %-98 %] 95 % (08/13 0643)  Weight:  [125.3 kg] 125.3 kg (08/12 2230) Physical Exam: General: Eating breakfast, in NAD, pleasant Cardiovascular: irregularly irregular, rate controlled Respiratory: CTAB without wheezing/rales Abdomen: obese, non-tender in all quadrants Extremities: No edema, dressing in place to RLE   Neuro:  Cranial Nerves: II: PERRL.  III,IV, VI: EOMI without ptosis or diplopia.  VII: Facial movement is symmetric.  VIII: hearing is intact to voice X: Palat elevates symmetrically XI: Shoulder shrug is symmetric. XII: tongue is midline without atrophy or fasciculations.  Motor: Tone is normal. Bulk is normal. 5/5 strength in upper extremities, able to lift lower extremities up against gravity   Cerebellar: Mild dysmetria with FNF. Alternating hand movements are slow.   Laboratory: Recent Labs  Lab 08/04/21 1647 08/05/21 0440 08/06/21 0102  WBC 4.5 4.6 5.7  HGB 11.3* 10.3* 11.1*  HCT 36.0 33.3* 34.8*  PLT 173 78* 167   Recent Labs  Lab 08/04/21 1647 08/04/21 2303 08/05/21 0440 08/05/21 0557 08/05/21 1238 08/06/21 0102  NA 140  --  137  --  139 140  K 3.8  --  5.8* 4.3 4.0 3.7  CL 106  --  105  --  107 108  CO2 26  --  20*  --  25 26  BUN 20  --  21  --  24* 21  CREATININE 1.28*  --  1.08*  --  1.25* 1.06*  CALCIUM 9.0  --  8.6*  --  8.9 8.8*  PROT 6.7 6.0*  --   --   --   --   BILITOT 0.8 0.5  --   --   --   --   ALKPHOS 91 78  --   --   --   --   ALT 24 22  --   --   --   --   AST 27 21  --   --   --   --   GLUCOSE 152*  --  302*  --  252* 67*     Imaging/Diagnostic Tests: DG Chest 2 View  Result Date: 08/05/2021 CLINICAL DATA:  Dyspnea EXAM: CHEST - 2 VIEW COMPARISON:  Chest radiograph 1 day prior FINDINGS: A left chest wall cardiac device and loop recorder are unchanged. The heart is significantly enlarged, unchanged. The mediastinal contours are stable. There is calcified atherosclerotic plaque of the aortic arch. There is persistent confluent retrocardiac opacity on the AP view. On the lateral view, there are prominent patchy opacities felt more likely to reflect vasculature. The right lung is clear. There is no significant pleural effusion. There is no pneumothorax. There is vascular congestion without definite overt pulmonary edema. The bones are stable.  IMPRESSION: 1. Patchy retrocardiac opacities are felt more likely to reflect bronchovascular structures; however, infection would be difficult to entirely exclude. 2. Cardiomegaly with vascular congestion; no definite overt pulmonary edema. Electronically Signed   By: Valetta Mole MD   On: 08/05/2021 09:54   MR Brain W and Wo Contrast  Result Date: 08/05/2021 CLINICAL DATA:  Neuro deficit, acute, stroke suspected EXAM: MRI HEAD WITHOUT AND WITH CONTRAST TECHNIQUE: Multiplanar, multiecho pulse sequences of the brain and surrounding structures were obtained without and with intravenous contrast. CONTRAST:  64m GADAVIST GADOBUTROL 1 MMOL/ML IV SOLN COMPARISON:  CT head 08/04/2021.  MRI October 14, 2018. FINDINGS: Brain: Small acute infarct in the left pons. No significant edema or mass effect. Prominent T2/FLAIR hyperintensity of the pons, likely representing severe microvascular ischemic change with multiple small remote pontine  lacunar infarcts. Additional T2/FLAIR hyperintensity of the left middle cerebellar peduncle, likely representing Wallerian type degeneration with focal t2 hyperintensity in this region likely an additional remote lacunar infarct. Milder supratentorial chronic microvascular ischemic disease with scattered white matter T2 hyperintensities. No hydrocephalus. No acute hemorrhage. No extra-axial fluid collections. No midline shift. No abnormal enhancement. Punctate focus of susceptibility artifact in the right pons, compatible with prior microhemorrhage. Vascular: Major arterial flow voids are maintained at the skull base. Skull and upper cervical spine: Normal marrow signal. Sinuses/Orbits: Clear visualized sinuses.  Unremarkable orbits. Other: No sizable mastoid effusions. IMPRESSION: 1. Small acute infarct in the left pons. No significant edema or mass effect. 2. Severe chronic microvascular ischemic disease involving the pons and left middle cerebellar peduncle with many remote lacunar  infarcts, progressed from 2019. Milder chronic microvascular ischemic disease elsewhere. 3. Remote pontine microhemorrhage, potentially hypertensive. Electronically Signed   By: Margaretha Sheffield M.D.   On: 08/05/2021 14:33   ECHOCARDIOGRAM COMPLETE  Result Date: 08/05/2021    ECHOCARDIOGRAM REPORT   Patient Name:   [name] Date of Exam: 08/05/2021 Medical Rec #:  [id number]                  Height:       67.0 in Accession #:    [id number]                 Weight:       240.0 lb Date of Birth:  10-26-1951                   BSA:          2.185 m Patient Age:    66 years                   BP:           103/36 mmHg Patient Gender: F                          HR:           48 bpm. Exam Location:  Inpatient Procedure: 2D Echo, Cardiac Doppler, Color Doppler and Intracardiac            Opacification Agent Indications:    Dyspnea R06.00  History:        Patient has prior history of Echocardiogram examinations, most                 recent 03/08/2020. CHF, Pacemaker, Stroke, Arrythmias:Atrial                 Fibrillation, Signs/Symptoms:Murmur; Risk Factors:Hypertension,                 Diabetes and Dyslipidemia. Complete Heart Block. GERD.                 Hypothyroidism.  Sonographer:    Tiffany Dance RVT Referring Phys: [place]  1. Left ventricular ejection fraction, by estimation, is 60 to 65%. The left ventricle has normal function. The left ventricle has no regional wall motion abnormalities. There is mild left ventricular hypertrophy. Left ventricular diastolic parameters are indeterminate.  2. Right ventricular systolic function is normal. The right ventricular size is normal. There is normal pulmonary artery systolic pressure.  3. The mitral valve is normal in structure. No evidence of mitral valve regurgitation. No evidence of mitral stenosis.  4. The aortic valve is tricuspid.  Aortic valve regurgitation is not visualized. Mild to moderate aortic valve  sclerosis/calcification is present, without any evidence of aortic stenosis.  5. The inferior vena cava is normal in size with greater than 50% respiratory variability, suggesting right atrial pressure of 3 mmHg. Comparison(s): No significant change from prior study. Prior images reviewed side by side. FINDINGS  Left Ventricle: Left ventricular ejection fraction, by estimation, is 60 to 65%. The left ventricle has normal function. The left ventricle has no regional wall motion abnormalities. Definity contrast agent was given IV to delineate the left ventricular  endocardial borders. The left ventricular internal cavity size was normal in size. There is mild left ventricular hypertrophy. Left ventricular diastolic parameters are indeterminate. Right Ventricle: The right ventricular size is normal. No increase in right ventricular wall thickness. Right ventricular systolic function is normal. There is normal pulmonary artery systolic pressure. The tricuspid regurgitant velocity is 2.43 m/s, and  with an assumed right atrial pressure of 3 mmHg, the estimated right ventricular systolic pressure is 123XX123 mmHg. Left Atrium: Left atrial size was normal in size. Right Atrium: Right atrial size was normal in size. Pericardium: There is no evidence of pericardial effusion. Mitral Valve: The mitral valve is normal in structure. There is mild thickening of the mitral valve leaflet(s). There is mild calcification of the mitral valve leaflet(s). No evidence of mitral valve regurgitation. No evidence of mitral valve stenosis. Tricuspid Valve: The tricuspid valve is normal in structure. Tricuspid valve regurgitation is trivial. No evidence of tricuspid stenosis. Aortic Valve: The aortic valve is tricuspid. Aortic valve regurgitation is not visualized. Mild to moderate aortic valve sclerosis/calcification is present, without any evidence of aortic stenosis. Pulmonic Valve: The pulmonic valve was normal in structure. Pulmonic valve  regurgitation is not visualized. No evidence of pulmonic stenosis. Aorta: The aortic root is normal in size and structure. Venous: The inferior vena cava is normal in size with greater than 50% respiratory variability, suggesting right atrial pressure of 3 mmHg. IAS/Shunts: No atrial level shunt detected by color flow Doppler. Additional Comments: A device lead is visualized in the right ventricle.  LEFT VENTRICLE PLAX 2D LVIDd:         4.30 cm LVIDs:         3.10 cm LV PW:         1.30 cm LV IVS:        1.50 cm LVOT diam:     2.10 cm LV SV:         73 LV SV Index:   34 LVOT Area:     3.46 cm  RIGHT VENTRICLE TAPSE (M-mode): 1.7 cm LEFT ATRIUM             Index LA diam:        4.80 cm 2.20 cm/m LA Vol (A2C):   50.4 ml 23.06 ml/m LA Vol (A4C):   44.0 ml 20.14 ml/m LA Biplane Vol: 48.9 ml 22.38 ml/m  AORTIC VALVE LVOT Vmax:   89.95 cm/s LVOT Vmean:  60.200 cm/s LVOT VTI:    0.212 m  AORTA Ao Root diam: 3.10 cm Ao Asc diam:  3.15 cm MITRAL VALVE               TRICUSPID VALVE MV Area (PHT): 2.79 cm    TR Peak grad:   23.6 mmHg MV Decel Time: 272 msec    TR Vmax:        243.00 cm/s MV E velocity: 77.10 cm/s MV A velocity: 57.50  cm/s  SHUNTS MV E/A ratio:  1.34        Systemic VTI:  0.21 m                            Systemic Diam: 2.10 cm Candee Furbish MD Electronically signed by Candee Furbish MD Signature Date/Time: 08/05/2021/1:01:37 PM    Final      Sharion Settler, DO 08/06/2021, 7:08 AM PGY-2, [place] Intern pager: [phone], text pages welcome

## 2021-08-06 NOTE — Progress Notes (Signed)
Notified on call PCP that patient was very drowsy after receiving ativan for MRI prior to arrival to unit and was unable to administer PO medications. After notification, MD at bedside.

## 2021-08-06 NOTE — Progress Notes (Signed)
Received in report from previous nurse in Ed that patient was entering BG levels taken by hospital staff into her implanted insulin pump and receiving insulin from pump and not insulin administration from hospital staff. Discussed this with patient and patient confirmed this to be accurate.

## 2021-08-06 NOTE — Progress Notes (Signed)
Patient arrived to unit with insulin pump in RLQ of abdomen. Received in report from ED nurse that parts from patient's insulin pump were discarded while in MRI.

## 2021-08-06 NOTE — Evaluation (Signed)
Occupational Therapy Evaluation Patient Details Name: [name] MRN: [id number] DOB: 02-Sep-1951 Today's Date: 08/06/2021    History of Present Illness 70 y.o. 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 Imaging confirmed acute pontine stroke.   Clinical Impression   Patient admitted for the diagnosis above.  Bed level eval completed, patient attempted OOB with PT earlier, and wanted to rest and try again tomorrow.  Barriers are listed below.  PTA she lives with family, who able to assist.  She was able to walk short distances with a platform RW and supervision.  She did need assist with lower body ADL.  Currently she is needing up to +2 for basic transfers, and up to total assist for ADL completion at bed level.  CIR has been recommended.  She will need post acute rehab prior to retuning home.  OT is indicated in the acute setting to maximize her functional status.      Follow Up Recommendations  CIR    Equipment Recommendations  None recommended by OT    Recommendations for Other Services Rehab consult     Precautions / Restrictions Precautions Precautions: Fall Precaution Comments: watch gait belt placement - insulin pump Rt abdomen Restrictions Weight Bearing Restrictions: No      Mobility Bed Mobility   Bed Mobility: Rolling Rolling: Min assist         General bed mobility comments: deferred EOB this session Patient Response: Cooperative  Transfers                      Balance                                           ADL either performed or assessed with  clinical judgement   ADL Overall ADL's : Needs assistance/impaired     Grooming: Wash/dry hands;Wash/dry face;Bed level           Upper Body Dressing : Minimal assistance;Bed level   Lower Body Dressing: Total assistance;Bed level                       Vision Baseline Vision/History: Wears glasses Wears Glasses: At all times Patient Visual Report: No change from baseline       Perception     Praxis      Pertinent Vitals/Pain Pain Assessment: No/denies pain     Hand Dominance Right   Extremity/Trunk Assessment Upper Extremity Assessment Upper Extremity Assessment: Generalized weakness RUE Deficits / Details: slowed coordination and decreased end range shoulder flexion. RUE Sensation: WNL LUE Deficits / Details: slowed coordination and decreased end range shoulder flexion.  Humeral fracture in the past. LUE Sensation: WNL       Cervical / Trunk Assessment Cervical / Trunk Assessment: Kyphotic   Communication Communication Communication: Expressive difficulties   Cognition Arousal/Alertness: Awake/alert Behavior During Therapy: WFL for tasks assessed/performed Overall Cognitive Status: Within Functional Limits for tasks assessed  Home Living Family/patient expects to be discharged to:: Private residence Living Arrangements: Other relatives Available Help at Discharge: Family;Available PRN/intermittently Type of Home: House Home Access: Ramped entrance     Home Layout: Multi-level;Able to live on main level with bedroom/bathroom Alternate Level Stairs-Number of Steps: stays on the main level   Bathroom Shower/Tub: Tub/shower unit   Bathroom Toilet: Handicapped height Bathroom Accessibility: No   Home Equipment: Wheelchair - power;Grab bars - toilet;Walker - 4 wheels;Wheelchair - Transport planner Comments: PFRW       Prior Functioning/Environment Level of Independence: Needs assistance  Gait / Transfers Assistance Needed: Short distances with platform rollator, stand pivots ADL's / Homemaking Assistance Needed: Granddaughter assists with setup for sponge bath, shoes and socks, and assists with her back.  Family assists with meds, meals, home management and community mobility            OT Problem List: Decreased strength;Decreased range of motion;Decreased activity tolerance;Impaired balance (sitting and/or standing);Obesity      OT Treatment/Interventions: Self-care/ADL training;Therapeutic exercise;DME and/or AE instruction;Balance training;Patient/family education;Therapeutic activities    OT Goals(Current goals can be found in the care plan section) Acute Rehab OT Goals Patient Stated Goal: Return home when I'm stronger. OT Goal Formulation: With patient Time For Goal Achievement: 08/06/21 Potential to Achieve Goals: Good ADL Goals Pt Will Perform Grooming: with set-up;sitting Pt Will Perform Upper Body Bathing: with set-up;sitting Pt Will Perform Upper Body Dressing: with set-up;sitting Pt Will Transfer to Toilet: with min assist;stand pivot transfer;bedside commode Pt Will Perform Toileting - Clothing Manipulation and hygiene: with min guard assist;sitting/lateral leans  OT Frequency: Min 2X/week   Barriers to D/C:    none noted       Co-evaluation              AM-PAC OT "6 Clicks" Daily Activity     Outcome Measure Help from another person eating meals?: None Help from another person taking care of personal grooming?: None Help from another person toileting, which includes using toliet, bedpan, or urinal?: Total Help from another person bathing (including washing, rinsing, drying)?: A Lot Help from another person to put on and taking off regular upper body clothing?: A Little Help from another person to put on and taking off regular lower body clothing?: Total 6 Click  Score: 15   End of Session Nurse Communication: Mobility status  Activity Tolerance: Patient tolerated treatment well Patient left: in bed;with call bell/phone within reach;with bed alarm set  OT Visit Diagnosis: Muscle weakness (generalized) (M62.81)                Time: [id number] OT Time Calculation (min): 17 min Charges:  OT General Charges $OT Visit: 1 Visit OT Evaluation $OT Eval Moderate Complexity: 1 Mod  08/06/2021  Rich, OTR/L  Acute Rehabilitation Services  Office:  [phone]   Metta Clines 08/06/2021, 3:33 PM

## 2021-08-06 NOTE — Progress Notes (Signed)
Carotid duplex has been completed.   Preliminary results in CV Proc.   Abram Sander 08/06/2021 9:36 AM

## 2021-08-06 NOTE — Progress Notes (Addendum)
FPTS Interim Progress Note  S:Patient seen and evaluated at bedside after receiving page from RN on 5W that patient was very drowsy after receiving 1.'5mg'$  Ativan for MRI. RN had concern about providing patient with PO meds given altered mental status. Patient was difficult to arouse, but would open eyes and groan with tactile stimuli. She was unable to answer questions, but after turning on room lights she appeared more alert. RN was checking CBG as I examined the patient and RT called to bedside for ABG and BiPAP placement.  O: BP (!) 127/51 (BP Location: Right Arm)   Pulse 60   Temp 98.6 F (37 C) (Axillary)   Resp (!) 22   SpO2 98%   Gen: Difficult to arouse, in no distress HEENT: Pupils PERRL, pupils not pinpoint or fixed Resp: CTAB, no increased WOB Skin: Warm, dry, well-perfused Neuro: Awake, drowsy, opens eyes to tactile stimuli. Does not answer questions or follow commands.   A/P: AMS likely 2/2 medication (Ativan)  ABG ordered, will follow up on results CBG 85 BiPAP at night, will replace on BiPAP pending ABG results Will frequently monitor and reassess  Labs and orders reviewed. Orvis Brill, DO 08/06/2021, 12:36 AM PGY-1, [place] Medicine Service pager [phone]

## 2021-08-06 NOTE — Evaluation (Signed)
Physical Therapy Evaluation Patient Details Name: [name] MRN: [id number] DOB: 1951-08-07 Today's Date: 08/06/2021   History of Present Illness  70 y.o. 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 Imaging confirmed acute pontine stroke.  Clinical Impression  Pt admitted with above diagnosis. Pt transfered and walked short distances in household with platform rollator prior to admission. Current requires up to max assist with sit<>stand and min-max assist with various bed mobility techniques. Pt with significantly greater difficulty with mobility. Is alone most of the day while her cousin works and granddaughter is returning to school in about a week. She will need to be able to independently transfer herself walk short distances into bathroom in order to safely return home without assistance during the day. Very motivated and likely a good candidate for CIR pending their approval.  Pt currently with functional limitations due to the deficits listed below (see PT Problem List). Pt will benefit from skilled PT to increase their independence and safety with mobility to allow discharge to the venue listed below.       Follow Up Recommendations CIR    Equipment Recommendations  None recommended by PT (TBD next venue of care)    Recommendations for Other Services Rehab consult     Precautions / Restrictions Precautions Precautions: Fall Precaution Comments: watch gait belt placement - insulin pump Rt abdomen Restrictions Weight Bearing Restrictions: No      Mobility  Bed Mobility Overal bed mobility: Needs  Assistance Bed Mobility: Rolling;Sidelying to Sit;Sit to Sidelying Rolling: Min assist Sidelying to sit: Mod assist     Sit to sidelying: Max assist General bed mobility comments: Min assist with rolling, able to weakly reach and pull with RUE with cues for sequencing. Mod assist for trunk to rise and lower onto side again with guidance. Max assist to scoot on edge of bed, to rise LEs into bed, and to assist with bridging to scoot to [facility].    Transfers Overall transfer level: Needs assistance Equipment used: Rolling walker (2 wheeled) Transfers: Sit to/from Stand Sit to Stand: Max assist;From elevated surface;Mod assist         General transfer comment: Initial sit<>stand required Max assist, pt with heavy posterior lean. Second attempt from elevated surface mod assist for boost and to assist with anterior weight shift. Still leaning posteriorly on second attempt however improved a bit. Attempt with pivot transfer however pt became weak and started leaning heavily towards left making transfer technique unsafe with 1+ assist at this time.  Ambulation/Gait                Hotel manager mobility:  (motorized w/c charging - showing some charge on battery indicator however would not operate - plugged back in to charge further.)  Modified Rankin (Stroke Patients Only) Modified Rankin (Stroke Patients Only) Pre-Morbid Rankin Score: Moderately severe disability Modified Rankin: Severe disability     Balance Overall balance assessment: Needs assistance Sitting-balance support: No upper extremity supported;Feet supported Sitting balance-Leahy Scale: Fair Sitting balance - Comments: able to stabilize for <30 seconds at a time at EOB without UE support. Postural  control: Posterior lean Standing balance support: Bilateral upper extremity supported Standing balance-Leahy Scale: Poor                                Pertinent Vitals/Pain Pain Assessment: No/denies pain    Home Living Family/patient expects to be discharged to:: Private residence Living Arrangements: Other relatives Available Help at Discharge: Family (Cousin and granddaughter (14yo) ** Cousin works during the day, granddaughter will be going back to [employer].) Type of Home: House Home Access: Ramped entrance     Home Layout: Multi-level;Able to live on main level with bedroom/bathroom Home Equipment: Wheelchair - power;Grab bars - toilet;Walker - 4 wheels Administrator, [employer] with breaks, has a seat)      Prior Function Level of Independence: Needs assistance   Gait / Transfers Assistance Needed: Short distances with platform rollator, stand pivots  ADL's / Homemaking Assistance Needed: Needs assist with shoestrings, set-up for bath at sink. Family cooks/cleans.        Hand Dominance   Dominant Hand: Right    Extremity/Trunk Assessment   Upper Extremity Assessment Upper Extremity Assessment: Defer to OT evaluation    Lower Extremity Assessment Lower Extremity Assessment: Generalized weakness;LLE deficits/detail LLE Deficits / Details: maybe a slight difference in strength between LLE and RLE when compared with MMT - more noticable in weight bearing.       Communication   Communication: Expressive difficulties  Cognition Arousal/Alertness: Awake/alert Behavior During Therapy: WFL for tasks assessed/performed Overall Cognitive Status: Within Functional Limits for tasks assessed                                        General Comments General comments (skin integrity, edema, etc.): VSS during evaluation. bandage covering RLE.    Exercises General Exercises - Lower Extremity Ankle Circles/Pumps: AROM;Both;10 reps;Supine Quad Sets: Strengthening;5 reps;Both;Supine Gluteal Sets: Strengthening;Both;5 reps;Supine Heel Slides: Strengthening;Both;5 reps;Supine Hip ABduction/ADduction:  Strengthening;Both;5 reps;Seated Straight Leg Raises: Strengthening;Both;5 reps;Supine   Assessment/Plan    PT Assessment Patient needs continued PT services  PT Problem List Decreased strength;Decreased range of motion;Decreased activity tolerance;Decreased balance;Decreased coordination;Decreased mobility;Obesity;Decreased knowledge of use of DME       PT Treatment Interventions DME instruction;Gait training;Functional mobility training;Therapeutic activities;Therapeutic exercise;Balance training;Neuromuscular re-education;Patient/family education    PT Goals (Current goals can be found in the Care Plan section)  Acute Rehab PT Goals Patient Stated Goal: Get well to go home PT Goal Formulation: With patient Time For Goal Achievement: 08/20/21 Potential to Achieve Goals: Good    Frequency Min 4X/week   Barriers to discharge Decreased caregiver support cousin works, granddaughter 70 yo. going back to school next week    Co-evaluation               AM-PAC PT "6 Clicks" Mobility  Outcome Measure Help needed turning from your back to your side while in a flat bed without using bedrails?: A Lot Help needed moving from lying on your back to sitting on the side of a flat bed without using bedrails?: A Lot Help needed moving to and from a bed to a chair (including a wheelchair)?: Total Help needed standing up from a chair using your arms (e.g., wheelchair or bedside chair)?: A Lot Help needed to walk in hospital room?: Total Help needed climbing 3-5 steps with a railing? : Total 6 Click Score: 9  End of Session Equipment Utilized During Treatment: Gait belt Activity Tolerance: Patient tolerated treatment well Patient left: in bed;with call bell/phone within reach;with bed alarm set Nurse Communication: Mobility status PT Visit Diagnosis: Muscle weakness (generalized) (M62.81);Difficulty in walking, not elsewhere classified (R26.2);Other symptoms and signs involving the  nervous system (R29.898);Hemiplegia and hemiparesis Hemiplegia - Right/Left: Left Hemiplegia - dominant/non-dominant: Non-dominant Hemiplegia - caused by: Cerebral infarction    Time: [id number] PT Time Calculation (min) (ACUTE ONLY): 55 min   Charges:   PT Evaluation $PT Eval Moderate Complexity: 1 Mod PT Treatments $Therapeutic Exercise: 8-22 mins $Therapeutic Activity: 23-37 mins        Elayne Snare, PT, DPT  Ellouise Newer 08/06/2021, 11:30 AM

## 2021-08-06 NOTE — Progress Notes (Signed)
Patient eating dinner - she states her insulin pump gave her too much insulin. She states she was given 1.9 via insulin pump. Encouraged to eat her meal.

## 2021-08-07 DIAGNOSIS — Z6841 Body Mass Index (BMI) 40.0 and over, adult: Secondary | ICD-10-CM

## 2021-08-07 DIAGNOSIS — R531 Weakness: Secondary | ICD-10-CM

## 2021-08-07 DIAGNOSIS — E1069 Type 1 diabetes mellitus with other specified complication: Secondary | ICD-10-CM

## 2021-08-07 DIAGNOSIS — E782 Mixed hyperlipidemia: Secondary | ICD-10-CM

## 2021-08-07 DIAGNOSIS — I639 Cerebral infarction, unspecified: Secondary | ICD-10-CM | POA: Diagnosis not present

## 2021-08-07 DIAGNOSIS — R0600 Dyspnea, unspecified: Secondary | ICD-10-CM | POA: Diagnosis not present

## 2021-08-07 LAB — GLUCOSE, CAPILLARY
Glucose-Capillary: 151 mg/dL — ABNORMAL HIGH (ref 70–99)
Glucose-Capillary: 146 mg/dL — ABNORMAL HIGH (ref 70–99)
Glucose-Capillary: 192 mg/dL — ABNORMAL HIGH (ref 70–99)
Glucose-Capillary: 209 mg/dL — ABNORMAL HIGH (ref 70–99)
Glucose-Capillary: 234 mg/dL — ABNORMAL HIGH (ref 70–99)

## 2021-08-07 MED ORDER — TORSEMIDE 20 MG PO TABS
10.0000 mg | ORAL_TABLET | Freq: Every day | ORAL | Status: DC
  Administered 2021-08-07 – 2021-08-10 (×4): 10 mg via ORAL
  Filled 2021-08-07 (×4): qty 1

## 2021-08-07 NOTE — Progress Notes (Signed)
Family Medicine Teaching Service Daily Progress Note Intern Pager: [phone]  Patient name: Altair Pollmann Medical record number: [id number] Date of birth: [birth year] Age: 70 y.o. Gender: female  Primary Care Provider: Eulis Foster, MD Consultants: Neurology Code Status: Full  Pt Overview and Major Events to Date:  8/11- admitted, CT head negative 8/12- MRI showing new acute infarct of L pons  Assessment and Plan: Lashelle Lowenstein is a 70yo female who presented with left-sided weakness and transient dysrthria, found to have acute L pontine infarct. PMH significant for T1DM, prior CVA with residual L sided deficits, A Fib on Eliquis, s/p pacemaker, HTN, HLD, morbid obesity, OSA, HFpEF, GERD, hx thyroid CA s/p total thyroidectomy.   Acute CVA Left Pons Patient feels well now. Still with some slurred speech but she states this is her baseline. PT/OT are recommending CIR. - Consult to IP rehab MD - Continue PT OT - Continue Eliquis - Continue Lipitor '80mg'$  daily  OSA  HFpEF  HTN BP has been running stable in the 140s/50s. Uses BiPAP at night. Echo stable from previous. No evidence of acute exacerbation. - Orthostatic vitals - Ambulate with pulse ox  Constipation Had a BM yesterday after starting MiraLAX and Senna. - Continue MiraLAX, Senna  Urinary retention, resolved Now voiding spontaneously. - Can d/c q8 bladder scans  - Strict I/O  T1DM On insulin pump. A1c 10.3. Glucose well-controlled today: 146-151. - Continue insulin pump. - Diabetes coordinator on board.  A Fib Hx of complete AV block s/p Pacemaker Rate controlled.  - Continue Eliquis  Pressure ulcer of L Heel - Continue Prevalon boots - Elevate heel off bed  Depression, stable - Continue Prozac '30mg'$  daily  Acquired hypothyroidism 2/2 total thyroidectomy - Continue levothyroxine  GERD, chronic, stable -Continue Protonix '40mg'$  BID   FEN/GI: Heart healthy, carb modified  PPx: On  eliquis Dispo:CIR  pending acceptance and insurance authorization . Barriers include placement.   Subjective:  Ms. Fielder has no acute complaints this morning. She had a bowel movement yesterday after not having one for two days prior. Also is now voiding spontaneously.   Objective: Temp:  [97.8 F (36.6 C)-99 F (37.2 C)] 98.2 F (36.8 C) (08/14 0754) Pulse Rate:  [61-64] 63 (08/14 0754) Resp:  [14-20] 14 (08/14 0754) BP: (115-153)/(51-64) 139/57 (08/14 0754) SpO2:  [93 %-100 %] 93 % (08/14 0754) Physical Exam: General: Comfortable appearing, sitting up eating breakfast, NAD Cardiovascular: Paced rhythm, correlated to monitor, no m/r/g Respiratory: Normal WOB on RA, no wheezes, rales, rhonchi Abdomen: Obese, non-tender Extremities: Without edema, prevalon boots not in place Neuro: Finger to nose testing slow, but without ataxia. No facial asymmetry, EOMs intact. Right sided stength remains 4/5. No sensory deficits.   Laboratory: Recent Labs  Lab 08/04/21 1647 08/05/21 0440 08/06/21 0102  WBC 4.5 4.6 5.7  HGB 11.3* 10.3* 11.1*  HCT 36.0 33.3* 34.8*  PLT 173 78* 167   Recent Labs  Lab 08/04/21 1647 08/04/21 2303 08/05/21 0440 08/05/21 0557 08/05/21 1238 08/06/21 0102  NA 140  --  137  --  139 140  K 3.8  --  5.8* 4.3 4.0 3.7  CL 106  --  105  --  107 108  CO2 26  --  20*  --  25 26  BUN 20  --  21  --  24* 21  CREATININE 1.28*  --  1.08*  --  1.25* 1.06*  CALCIUM 9.0  --  8.6*  --  8.9 8.8*  PROT 6.7 6.0*  --   --   --   --   BILITOT 0.8 0.5  --   --   --   --   ALKPHOS 91 78  --   --   --   --   ALT 24 22  --   --   --   --   AST 27 21  --   --   --   --   GLUCOSE 152*  --  302*  --  252* 67*    Imaging/Diagnostic Tests: No new imaging / tests  Eppie Gibson, MD 08/07/2021, 8:47 AM PGY-1, [place] Intern pager: [phone], text pages welcome

## 2021-08-07 NOTE — Progress Notes (Signed)
Inpatient Rehab Admissions:  Inpatient Rehab Consult received.  I met with patient at the bedside for rehabilitation assessment and to discuss goals and expectations of an inpatient rehab admission.  Pt acknowledged understanding of CIR goals and expectations. Pt interested in pursuing CIR. Pt gave permission to contact sister, [name]. [name] acknowledged understanding of CIR goals and expectations. She is also interested in pt pursuing CIR.  She confirmed that she will be able to provide supportive assistance and not physical assistance to pt.  Will continue to follow.  Signed: Gayland Curry, [place], [place] Admissions Coordinator [phone]

## 2021-08-07 NOTE — Progress Notes (Signed)
Orthostatics ordered on patient today as per conversation in patient room with MD. MD stated may be safer with therapy to complete. Patient has been a 2 person max assist just moving around in bed today. Notified MD this nurse not comfortable standing patient with her noted weakness. He will order for PT to do orthostatics tomorrow.

## 2021-08-07 NOTE — PMR Pre-admission (Signed)
PMR Admission Coordinator Pre-Admission Assessment  Patient: [name] is an 70 y.o., female MRN: [id number] DOB: Feb 20, 1951 Height: 5' 7" (170.2 cm) Weight: 123.4 kg  Insurance Information HMO:     PPO: yes     PCP:      IPA:      80/20:      OTHER:  PRIMARY: [employer] CHO      Policy#: [id number]      Subscriber: patient CM Name: Thora Lance      Phone#: [phone]. [phone]     Fax#: [phone] Pre-Cert#: [id number] Received approval from Wyona Almas with [employer] on 08/08/21. Pt approval begins 08/09/21.      Employer:  Benefits:  Phone #: n/a-online at [email or web address]     Name:  Eff. Date: 12/26/19-still active     Deduct: does not have      Out of Pocket Max: $4,900 ($995.89 met)    Life Max: NA CIR: $160/day co-pay with a max co-pay of $1,600/admission (10 days)      SNF: 100% coverage days 1-20, $178/day co-pay days 21-100, limited to 100 days/cal year Outpatient: $20/visit co-pay       Home Health: 100% coverage        DME: 80% coverage     Co-Pay: 20% co-insurance Providers: in-network SECONDARY:       Policy#:      Phone#:   Development worker, community:       Phone#:   The Engineer, [employer]" for patients in Inpatient Rehabilitation Facilities with attached "Privacy Act [place] Records" was provided and verbally reviewed with: Patient  Emergency Contact Information Contact Information     Name Relation Home Work Mobile   Herman Sister [phone]  [phone]   Lee,Candance Niece   [phone]       Current Medical History  Patient Admitting Diagnosis: Left pontine stroke  History of Present Illness: Pt is a 70 year old female with medical hx significant for: prior CVA with residual left sided deficits (2016 and 2019), A-fib, HTN, HLD, GERD, OSA, morbid obesity, Type I DM, s/p pacemaker (06/2020), CHF, HFpEF, h/o thyroid CA s/p total thyroidectomy. Pt presented to hospital on 08/04/21 d/t left sided weakness and  transient dysarthria. CT revealed no acute abnormalities and no bleeding. MRI on 08/05/21 revealed acute infarct in left pons. Echo on 08/05/21 showed EF 60-65%. Neurologist assessment revealed subtle right-sided weakness which, per family report, is new. Insulin pump reapplied to allow pt to manage insulin while hospitalized. Therapy evaluations completed and CIR recommended d/t pt's deficits in functional mobility.   Complete NIHSS TOTAL: 1  Patient[facility] has been reviewed by the rehabilitation admission coordinator and physician.  Past Medical History  Past Medical History:  Diagnosis Date   Abnormal echocardiogram    Anemia    Arthritis    BPPV (benign paroxysmal positional vertigo), right 06/13/2017   Bradycardia 03/07/2020   Cerebrovascular accident (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 ([place]) 10/29/2015   CHB (complete heart block) ([place]) 03/08/2020   CHF (congestive heart failure) ([place])    Closed fracture of left proximal humerus 07/08/2019   Decreased range of motion of left shoulder 09/03/2020   Depression    Dizzy 03/07/2020   bradycardia   Edentulous 09/03/2020   GERD (gastroesophageal reflux disease)    Hemiparesis affecting left side as late effect of cerebrovascular accident (CVA) ([place]) 03/08/2020   Hemorrhoids    History of Falls with injury  03/08/2020   Fall resulting in left humeral fracture   Hyperlipidemia    Hypertension    Hypothyroidism    Intracranial vascular stenosis    Lacunar infarction [facility])    Brain MRI 07/2021 multiple small remote pontine lacunar infarcts   Near syncope 03/08/2020   Paroxysmal atrial fibrillation (HCC)    Presence of permanent cardiac pacemaker    Proteinuria 03/08/2020   Seasonal allergies    Stroke ([place])    Thyroid cancer ([place])    per pt S/p Total Thyroidectomy with Radioactive Iodine Therapy   Type 1 diabetes mellitus ([place])     Family History   family history includes Diabetes in her  maternal aunt; Early death in her father; Heart attack in her maternal grandfather and mother; Heart disease in her maternal aunt, maternal grandfather, and mother; Heart failure in her maternal grandfather; Hyperlipidemia in her father and mother; Hypertension in her father and mother; Prostate cancer in her paternal uncle; Renal Disease in her mother; Stroke in her son.  Prior Rehab/Hospitalizations Has the patient had prior rehab or hospitalizations prior to admission? No  Has the patient had major surgery during 100 days prior to admission? No   Current Medications  Current Facility-Administered Medications:    apixaban (ELIQUIS) tablet 5 mg, 5 mg, Oral, BID, Carollee Leitz, MD, 5 mg at 08/08/21 0931   atorvastatin (LIPITOR) tablet 80 mg, 80 mg, Oral, QPM, Carollee Leitz, MD, 80 mg at 08/07/21 1655   FLUoxetine (PROZAC) capsule 30 mg, 30 mg, Oral, Daily, Jim Like B, MD, 30 mg at 08/08/21 0931   gabapentin (NEURONTIN) capsule 100 mg, 100 mg, Oral, TID, Carollee Leitz, MD, 100 mg at 08/08/21 0931   insulin pump, , Subcutaneous, TID WC, HS, 0200, Espinoza, Alejandra, DO, Given at 08/08/21 0736   levothyroxine (SYNTHROID) tablet 175 mcg, 175 mcg, Oral, QAC breakfast, Carollee Leitz, MD, 175 mcg at 08/08/21 0541   omega-3 acid ethyl esters (LOVAZA) capsule 1 g, 1 g, Oral, Daily, Jim Like B, MD, 1 g at 08/08/21 0932   pantoprazole (PROTONIX) EC tablet 40 mg, 40 mg, Oral, BID, Carollee Leitz, MD, 40 mg at 08/08/21 0931   polyethylene glycol (MIRALAX / GLYCOLAX) packet 17 g, 17 g, Oral, Daily, Espinoza, Alejandra, DO, 17 g at 08/08/21 0932   potassium chloride (KLOR-CON) CR tablet 10 mEq, 10 mEq, Oral, Daily, Eppie Gibson, MD   senna (SENOKOT) tablet 8.6 mg, 1 tablet, Oral, Daily, Espinoza, Alejandra, DO, 8.6 mg at 08/08/21 0931   torsemide (DEMADEX) tablet 10 mg, 10 mg, Oral, Daily, Eppie Gibson, MD, 10 mg at 08/08/21 4680  Patients Current Diet:  Diet Order             Diet heart  healthy/carb modified Room service appropriate? Yes; Fluid consistency: Thin  Diet effective now                   Precautions / Restrictions Precautions Precautions: Fall Precaution Comments: watch gait belt placement - insulin pump Rt abdomen Restrictions Weight Bearing Restrictions: No   Has the patient had 2 or more falls or a fall with injury in the past year? Yes  Prior Activity Level Limited Community (1-2x/wk): MD appointments  Prior Functional Level Self Care: Did the patient need help bathing, dressing, using the toilet or eating? Needed some help  Indoor Mobility: Did the patient need assistance with walking from room to room (with or without device)? Independent  Stairs: Did the patient need assistance with  internal or external stairs (with or without device)? Dependent  Functional Cognition: Did the patient need help planning regular tasks such as shopping or remembering to take medications? Independent  Development worker, international aid / Equipment Home Assistive Devices/Equipment: Wheelchair Home Equipment: Wheelchair - power, Grab bars - toilet, Environmental consultant - 4 wheels, Wheelchair - manual, Adaptive equipment  Prior Device Use: Indicate devices/aids used by the patient prior to current illness, exacerbation or injury? Motorized wheelchair or scooter and Barista  Overall Cognitive Status: Within Functional Limits for tasks assessed Orientation Level: Oriented X4 General Comments: very pleasant and cooperative with therapy, motivated to improve    Extremity Assessment (includes Sensation/Coordination)  Upper Extremity Assessment: Generalized weakness RUE Deficits / Details: slowed coordination and decreased end range shoulder flexion. RUE Sensation: WNL LUE Deficits / Details: slowed coordination and decreased end range shoulder flexion.  Humeral fracture in the past. LUE Sensation: WNL  Lower Extremity Assessment: Generalized weakness,  LLE deficits/detail LLE Deficits / Details: maybe a slight difference in strength between LLE and RLE when compared with MMT - more noticable in weight bearing.    ADLs  Overall ADL's : Needs assistance/impaired Grooming: Wash/dry hands, Wash/dry face, Bed level Upper Body Dressing : Minimal assistance, Bed level Lower Body Dressing: Total assistance, Bed level    Mobility  Overal bed mobility: Needs Assistance Bed Mobility: Rolling Rolling: Min assist Sidelying to sit: Mod assist Supine to sit: Min assist, HOB elevated Sit to sidelying: Max assist General bed mobility comments: MinA to roll to remove bed pain out from under her, then MinA with HOB elevated and use of rail to get to EOB, increased time and effort    Transfers  Overall transfer level: Needs assistance Equipment used: Rolling walker (2 wheeled) Transfers: Sit to/from Stand, [employer] Transfers Sit to Stand: Mod assist, +2 physical assistance, From elevated surface Stand pivot transfers: Mod assist, +2 physical assistance General transfer comment: ModAx2 and cues for hand placement/B feet blocked for multipole stands today; able to stand for about 6 minutes for pericare and orthostatic BPs but very fatigued. Needed max cues for safety/sequencing with pivot to recliner    Ambulation / Gait / Stairs / Wheelchair Mobility  Ambulation/Gait General Gait Details: unable- Freight forwarder mobility:  (motorized w/c charging - showing some charge on battery indicator however would not operate - plugged back in to charge further.)    Posture / Balance Dynamic Sitting Balance Sitting balance - Comments: close S statically Balance Overall balance assessment: Needs assistance Sitting-balance support: No upper extremity supported, Feet supported Sitting balance-Leahy Scale: Fair Sitting balance - Comments: close S statically Postural control: Posterior lean Standing balance support: Bilateral  upper extremity supported, During functional activity Standing balance-Leahy Scale: Poor Standing balance comment: reliant on BUE support and external support, tends to lean posteriorly and braces legs on bed to balance    Special needs/care consideration BiPAP (uses at night at times), Skin Wound: pretibial Right blister; Left heel blister/callous, Diabetic management insulin pump, Bowel and bladder incontinence, External urinary catheter, and Designated visitor Adrian Blackwater, sister   Previous Home Environment (from acute therapy documentation) Living Arrangements: Other relatives  Lives With: Family (sister, granddaughter) Available Help at Discharge: Family, Available PRN/intermittently Type of Home: House Home Layout: Two level, Able to live on main level with bedroom/bathroom Alternate Level Stairs-Number of Steps: stays on the main level Home Access: Ramped entrance Bathroom Shower/Tub: Other (comment) (pt reported she takes bird baths) Bathroom Toilet:  Handicapped height Bathroom Accessibility: No Home Care Services: Yes Type of Home Care Services: Home RN, [place] (if known): [place] Additional Comments: [place]  Discharge Living Setting Plans for Discharge Living Setting: Patient's home Type of Home at Discharge: House Discharge Home Layout: Two level, Able to live on main level with bedroom/bathroom Discharge Home Access: [place] entrance Discharge Bathroom Shower/Tub: Other (comment) (pt reports she takes bird baths) Discharge Bathroom Toilet: Handicapped height Discharge Bathroom Accessibility: No Does the patient have any problems obtaining your medications?: No  Social/Family/Support Systems Anticipated Caregiver: Adrian Blackwater, sister Anticipated Caregiver's Contact Information: [phone] Caregiver Availability: Intermittent Discharge Plan Discussed with Primary Caregiver: Yes Is Caregiver In Agreement with Plan?: Yes Does Caregiver/Family have  Issues with Lodging/Transportation while Pt is in Rehab?: No  Goals Patient/Family Goal for Rehab: Mod I-Supervision:PT/OT (specifically transfers to [facility]) Expected length of stay: 16-21 days Pt/Family Agrees to Admission and willing to participate: Yes Program Orientation Provided & Reviewed with Pt/Caregiver Including Roles  & Responsibilities: Yes  Decrease burden of Care through IP rehab admission: NA  Possible need for SNF placement upon discharge: Not anticipated  Patient Condition: I have reviewed medical records from [facility], spoken with CSW, and patient and family member. I met with patient at the bedside and discussed via phone for inpatient rehabilitation assessment.  Patient will benefit from ongoing PT and OT, can actively participate in 3 hours of therapy a day 5 days of the week, and can make measurable gains during the admission.  Patient will also benefit from the coordinated team approach during an Inpatient Acute Rehabilitation admission.  The patient will receive intensive therapy as well as Rehabilitation physician, nursing, social worker, and care management interventions.  Due to bladder management, safety, disease management, medication administration, pain management, and patient education the patient requires 24 hour a day rehabilitation nursing.  The patient is currently Min A-Max A +2 with mobility and Min A- Total A with basic ADLs.  Discharge setting and therapy post discharge at home with home health is anticipated.  Patient has agreed to participate in the Acute Inpatient Rehabilitation Program and will admit today.  Preadmission Screen Completed By:  Bethel Born, 08/08/2021 12:15 PM ______________________________________________________________________   Discussed status with Dr. Posey Pronto on 08/10/21  at 10:46 AM and received approval for admission today.  Admission Coordinator:  Bethel Born, CCC-SLP, time 10:46 AM/Date 08/10/21     Assessment/Plan: Diagnosis: Left pontine stroke Does the need for close, 24 hr/day Medical supervision in concert with the patient's rehab needs make it unreasonable for this patient to be served in a less intensive setting? Yes Co-Morbidities requiring supervision/potential complications: prior CVA with residual left sided deficits (2016 and 2019), A-fib, HTN (monitor and provide prns in accordance with increased physical exertion and pain), HLD, GERD, OSA, morbid obesity, Type I DM (Monitor in accordance with exercise and adjust meds as necessary), s/p pacemaker (06/2020), CHF, HFpEF, h/o thyroid CA s/p total thyroidectomy Due to bladder management, bowel management, safety, disease management, medication administration, and patient education, does the patient require 24 hr/day rehab nursing? Yes Does the patient require coordinated care of a physician, rehab nurse, PT, OT to address physical and functional deficits in the context of the above medical diagnosis(es)? Yes Addressing deficits in the following areas: balance, endurance, locomotion, strength, transferring, bathing, dressing, toileting, and psychosocial support Can the patient actively participate in an intensive therapy program of at least 3 hrs of therapy 5 days  a week? Yes The potential for patient to make measurable gains while on inpatient rehab is excellent Anticipated functional outcomes upon discharge from inpatient rehab: supervision and min assist PT, supervision and min assist OT, n/a SLP Estimated rehab length of stay to reach the above functional goals is: 10-14 days. Anticipated discharge destination: Home 10. Overall Rehab/Functional Prognosis: good   MD Signature: Delice Lesch, MD, ABPMR

## 2021-08-08 LAB — GLUCOSE, CAPILLARY
Glucose-Capillary: 180 mg/dL — ABNORMAL HIGH (ref 70–99)
Glucose-Capillary: 154 mg/dL — ABNORMAL HIGH (ref 70–99)
Glucose-Capillary: 189 mg/dL — ABNORMAL HIGH (ref 70–99)
Glucose-Capillary: 261 mg/dL — ABNORMAL HIGH (ref 70–99)
Glucose-Capillary: 357 mg/dL — ABNORMAL HIGH (ref 70–99)
Glucose-Capillary: 305 mg/dL — ABNORMAL HIGH (ref 70–99)
Glucose-Capillary: 212 mg/dL — ABNORMAL HIGH (ref 70–99)

## 2021-08-08 MED ORDER — AMLODIPINE BESYLATE 10 MG PO TABS
10.0000 mg | ORAL_TABLET | Freq: Every day | ORAL | Status: DC
  Administered 2021-08-09 – 2021-08-10 (×2): 10 mg via ORAL
  Filled 2021-08-08 (×2): qty 1

## 2021-08-08 MED ORDER — POTASSIUM CHLORIDE CRYS ER 10 MEQ PO TBCR
10.0000 meq | EXTENDED_RELEASE_TABLET | Freq: Every day | ORAL | Status: DC
  Administered 2021-08-08 – 2021-08-10 (×3): 10 meq via ORAL
  Filled 2021-08-08 (×3): qty 1

## 2021-08-08 MED ORDER — LOSARTAN POTASSIUM 50 MG PO TABS: 100.0000 mg | ORAL_TABLET | Freq: Every day | ORAL | Status: DC

## 2021-08-08 MED ORDER — OMEGA-3-ACID ETHYL ESTERS 1 G PO CAPS
1.0000 g | ORAL_CAPSULE | Freq: Every day | ORAL | Status: DC
  Administered 2021-08-08 – 2021-08-10 (×3): 1 g via ORAL
  Filled 2021-08-08 (×3): qty 1

## 2021-08-08 NOTE — Progress Notes (Addendum)
Family Medicine Teaching Service Daily Progress Note Intern Pager: [phone]  Patient name: [name] Medical record number: [id number] Date of birth: Mar 04, 1951 Age: 70 y.o. Gender: female  Primary Care Provider: Eulis Foster, MD Consultants: Neuro Code Status: Full  Pt Overview and Major Events to Date:  8/11- admitted 8/12- MRI showing new acute infarct of L pons  Assessment and Plan: [name] is a 70 yo female who presented with weakness and transient dysarthria, found to have acute L pontine infarct. PMH significant for T1DM, prior CVA with residual L sided deficits, A Fib on Eliquis, hx complete heart block s/p pacemaker, HTN, HLD, obesity, OSA, HFpEF, GERD, hx thyroid CA s/p total thyroidectomy. She is stable for discharge to CIR when a bed comes available.   Acute L pontine CVA Feels well. Still with R sided weakness. Dysarthria resolved.  - Stable for CIR - Continue PT/OT - Eliquis, Lipitor  OSA  HFpEF  HTN Using BiPAP overnight as at home. BP 140-150s/60-70s. Restarted home torsemide '10mg'$  yesterday. - Continue torsemide - Orthostatics prior to discharge - Ambulate with pulse ox prior to discharge  HLD Patient takes fish oil at home and is asking to take her fish oil while in the hospital.  Her home dose is 800 mg daily. -Continue Lipitor -Will add on omega-3 fish oil 1000 mg  Constipation, resolved - Continue MiraLAX and Senna  T1DM Glucose 154-234. - Continue Insulin pump - Diabetes coordinator following  A Fib Hx of complete AV block s/p pacemaker Rate controlled. - Continue Eliquis  Pressure ulcer L Heel - Continue Prevalon boots - Elevate heel off bed  Depression, chronic, stable - Continue Prozac '30mg'$  daily  S/p total thyroidectomy - Continue levothyroxine  GERD, chronic, stable - Continue Protonix '40mg'$  BID   FEN/GI: Heart Healthy, carb modified PPx: On Eliquis Dispo:CIR today. Barriers include  acceptance, insurance authorization, and bed availability.   Subjective:  [name] says that she feels well.  She says that she feels that her speech has returned to baseline today.  She is feeling up to trying to stand and walk with physical therapy today.  Objective: Temp:  [98.8 F (37.1 C)-99.5 F (37.5 C)] 98.8 F (37.1 C) (08/15 0729) Pulse Rate:  [60-69] 62 (08/15 0729) Resp:  [16-21] 19 (08/15 0729) BP: (107-156)/(59-84) 153/59 (08/15 0729) SpO2:  [93 %-97 %] 95 % (08/15 0729) Weight:  [122.9 kg-123.4 kg] 123.4 kg (08/15 0611) Physical Exam: General: Comfortable appearing, sitting up eating breakfast, NAD Cardiovascular: Paced rhythm, correlated to monitor, no murmur Respiratory: Normal work of breathing on room air, fields clear to auscultation, good air movement Abdomen: Obese, nontender Extremities: Prevalon boots in place, dressing on right shin Neuro: Speech without slurring today, no facial asymmetry, EOMs intact, right side remains weak compared to left  Laboratory: Recent Labs  Lab 08/04/21 1647 08/05/21 0440 08/06/21 0102  WBC 4.5 4.6 5.7  HGB 11.3* 10.3* 11.1*  HCT 36.0 33.3* 34.8*  PLT 173 78* 167   Recent Labs  Lab 08/04/21 1647 08/04/21 2303 08/05/21 0440 08/05/21 0557 08/05/21 1238 08/06/21 0102  NA 140  --  137  --  139 140  K 3.8  --  5.8* 4.3 4.0 3.7  CL 106  --  105  --  107 108  CO2 26  --  20*  --  25 26  BUN 20  --  21  --  24* 21  CREATININE 1.28*  --  1.08*  --  1.25*  1.06*  CALCIUM 9.0  --  8.6*  --  8.9 8.8*  PROT 6.7 6.0*  --   --   --   --   BILITOT 0.8 0.5  --   --   --   --   ALKPHOS 91 78  --   --   --   --   ALT 24 22  --   --   --   --   AST 27 21  --   --   --   --   GLUCOSE 152*  --  302*  --  252* 67*    Imaging/Diagnostic Tests: No new imaging, tests  [name] Gibson, MD 08/08/2021, 8:26 AM PGY-1, [place] Intern pager: [phone], text pages welcome

## 2021-08-08 NOTE — Progress Notes (Signed)
Occupational Therapy Treatment Patient Details Name: [name] MRN: [id number] DOB: 09/19/1951 Today's Date: 08/08/2021    History of present illness 70 y.o. 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 Imaging confirmed acute pontine stroke.   OT comments  Patient progressing and showed improved ability to perform ADLs at EOB or in standing compared to previous session where pt performed ADLs at bed level. Pt performed full sponge bath with Min As for upper body and Total assist for lower body, however pt able to assist with her positioning in standing to allow for thoroughness with peri care, and showed improved standing tolerance with 3 min stand performed to allow for orthostatics. Patient remains limited by generalized weakness and decreased activity tolerance along with deficits noted below. Pt continues to demonstrate good rehab potential and would benefit from continued skilled OT to increase safety and independence with ADLs and functional transfers to allow pt to return home safely and reduce caregiver burden and fall risk.   Follow Up Recommendations  CIR    Equipment Recommendations  None recommended by OT    Recommendations for Other Services Rehab consult    Precautions / Restrictions Precautions Precautions: Fall Precaution Comments: watch gait belt placement - insulin pump Rt abdomen Restrictions Weight Bearing Restrictions: No       Mobility Bed Mobility Overal bed mobility: Needs Assistance Bed Mobility: Rolling Rolling: Min assist   Supine to sit: Min assist;HOB elevated      General bed mobility comments: MinA to roll to remove bed pain out from under her, then MinA with HOB elevated and use of rail to get to EOB, increased time and effort    Transfers Overall transfer level: Needs assistance Equipment used: Rolling walker (2 wheeled) Transfers: Sit to/from [facility] Sit to Stand: Mod assist;+2 physical assistance;From elevated surface Stand pivot transfers: Mod assist;+2 physical assistance       General transfer comment: ModAx2 and cues for hand placement/B feet blocked for multipole stands today; able to stand for about 6 minutes for pericare and orthostatic BPs but very fatigued. Needed max cues for safety/sequencing with pivot to recliner. Min assist to lower self into recliner with f air eccentric control.    Balance Overall balance assessment: Needs assistance Sitting-balance support: No upper extremity supported;Feet supported Sitting balance-Leahy Scale: Fair Sitting balance - Comments: close S statically Postural control: Posterior lean Standing balance support: Bilateral upper extremity supported;During functional activity Standing balance-Leahy Scale: Poor Standing balance comment: reliant on BUE support and external support, tends to lean posteriorly and braces legs on bed to balance                           ADL either performed or assessed with clinical judgement   ADL           Upper Body Bathing: Sitting;Minimal assistance;Set up Upper Body Bathing Details (indicate cue type and reason): Pt performed seated UB bathing with setup of washcloth and Min Assist for thoroughness in skin folds. Lower Body Bathing: Total assistance;Sitting/lateral leans;Sit to/from stand;+2 for physical assistance Lower Body Bathing Details (indicate cue type and reason): Pt able to wash the very tops  of her upper legs while seated with setup, but ultimately requierd Total assist to clean peri areas, LEs and feet Pt stood for  peri cleansing with need of BUE support on RW and physical therapist providing external assistance for pt balance. Pt able to widen her base of support to allow better access for hygiene. Upper Body Dressing : Minimal assistance;Sitting;Set up Upper Body Dressing Details (indicate cue type and reason): Pt donned fresh anterior hospital gown with Min As. Lower Body Dressing: Total assistance;Sitting/lateral leans Lower Body Dressing Details (indicate cue type and reason): Total Assit to doff and don socks. Toilet Transfer: +2 for physical assistance;Moderate assistance;RW Toilet Transfer Details (indicate cue type and reason): Pt pivoted to recliner with use of RW and Moderate assist of 2 people.   Toileting - Clothing Manipulation Details (indicate cue type and reason): On pure wick. Total Assist for peri hygiene during bath.     Functional mobility during ADLs: Moderate assistance;+2 for physical assistance;Rolling walker;Cueing for sequencing;Cueing for safety       Vision Baseline Vision/History: Wears glasses Wears Glasses: At all times Patient Visual Report: No change from baseline Additional Comments: Assisted pt with cleaning her lenses.   Perception     Praxis      Cognition Arousal/Alertness: Awake/alert Behavior During Therapy: WFL for tasks assessed/performed Overall Cognitive Status: Within Functional Limits for tasks assessed                                 General Comments: very pleasant and cooperative with therapy, motivated to improve        Exercises     Shoulder Instructions       General Comments SpO2 89 at lowest, mostly in the 90s with activity; highest observed HR 111BPM with activity    Pertinent Vitals/ Pain       Pain Assessment: 0-10 Pain Score: 3  Pain Location: back pain with extended activities Pain Descriptors / Indicators: Aching;Discomfort;Sore Pain Intervention(s): Limited activity within patient's tolerance;Monitored  during session  Home Living                                          Prior Functioning/Environment              Frequency  Min 2X/week        Progress Toward Goals  OT Goals(current goals can now be found in the care plan section)  Progress towards OT goals: Progressing toward goals  Acute Rehab OT Goals Patient Stated Goal: Return home when I'm stronger. OT Goal Formulation: With patient Time For Goal Achievement: 08/20/21 Potential to Achieve Goals: Good  Plan Discharge plan remains appropriate    Co-evaluation    PT/OT/SLP Co-Evaluation/Treatment: Yes Reason for Co-Treatment: Complexity of the patient's impairments (multi-system involvement);For patient/therapist safety;To address functional/ADL transfers PT goals addressed during session: Mobility/safety with mobility;Balance OT goals addressed during session: ADL's and self-care;Proper use of Adaptive equipment and DME      AM-PAC OT "6 Clicks" Daily Activity     Outcome Measure   Help from another person eating meals?: None Help from another person taking care of personal grooming?: None Help from another person toileting, which includes using toliet, bedpan, or urinal?: Total Help from another person bathing (including washing, rinsing, drying)?: A Lot Help from another person to put on and  taking off regular upper body clothing?: A Little Help from another person to put on and taking off regular lower body clothing?: Total 6 Click Score: 15    End of Session Equipment Utilized During Treatment: Gait belt;Rolling walker  OT Visit Diagnosis: Muscle weakness (generalized) (M62.81)   Activity Tolerance Patient tolerated treatment well   Patient Left with call bell/phone within reach;in chair;with chair alarm set   Nurse Communication Mobility status        Time: [id number] OT Time Calculation (min): 46 min  Charges: OT General Charges $OT Visit: 1 Visit OT Treatments $Self  Care/Home Management : 23-37 mins  Anderson [place], [place] Office: [phone] 08/08/2021   Julien Girt 08/08/2021, 1:28 PM

## 2021-08-08 NOTE — Progress Notes (Signed)
FPTS Interim Night Progress Note  S:Patient sleeping comfortably.  Rounded with primary night RN. On BiPAP.   No concerns voiced.  No orders required.    O: Today's Vitals   08/07/21 2040 08/07/21 2237 08/07/21 2325 08/08/21 0021  BP:   (!) 153/65   Pulse:  67 69 63  Resp:  '16 17 19  '$ Temp:   99.2 F (37.3 C)   TempSrc:   Axillary   SpO2:  93% 97% 97%  Weight:      Height:      PainSc: 0-No pain         A/P: Continue to monitor   Carollee Leitz MD PGY-3, [facility] pager [phone]

## 2021-08-08 NOTE — Progress Notes (Signed)
Physical Therapy Treatment Patient Details Name: [name] MRN: [id number] DOB: 04-21-1951 Today's Date: 08/08/2021    History of Present Illness 70 y.o. 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 Imaging confirmed acute pontine stroke.    PT Comments    Patient received in bed, pleasant and cooperative with therapy. Able to complete bed mobility with much less assist, but still continues to require +2 assist for all standing/transfer tasks today. Orthostatic BPs as below, SpO2 89-90% on RA with activity at lowest, and HR 111BPM with activity at most. Left up in recliner with all needs met, RN aware of patient status. Continue to recommend CIR.      08/08/21 1131  Vital Signs  Patient Position (if appropriate) Orthostatic Vitals  Orthostatic Lying   BP- Lying 168/53  Pulse- Lying 63  Orthostatic Sitting  BP- Sitting 163/66  Pulse- Sitting 71  Orthostatic Standing at 0 minutes  BP- Standing at 0 minutes (!) 166/104  Pulse- Standing at 0 minutes 102  Orthostatic Standing at 3 minutes  BP- Standing at 3 minutes (!) 171/112  Pulse- Standing at 3 minutes 111  Oxygen Therapy  SpO2 90 %  O2 Device Room Air    Follow Up Recommendations  CIR     Equipment Recommendations  None recommended by PT (TBD next care venue)    Recommendations for Other Services       Precautions / Restrictions Precautions Precautions: Fall Precaution Comments: watch gait belt placement - insulin pump Rt abdomen Restrictions Weight Bearing Restrictions: No    Mobility  Bed Mobility Overal bed mobility: Needs  Assistance Bed Mobility: Rolling Rolling: Min assist   Supine to sit: Min assist;HOB elevated     General bed mobility comments: MinA to roll to remove bed pain out from under her, then MinA with HOB elevated and use of rail to get to EOB, increased time and effort    Transfers Overall transfer level: Needs assistance Equipment used: Rolling walker (2 wheeled) Transfers: Sit to/from [facility] Sit to Stand: Mod assist;+2 physical assistance;From elevated surface Stand pivot transfers: Mod assist;+2 physical assistance       General transfer comment: ModAx2 and cues for hand placement/B feet blocked for multipole stands today; able to stand for about 6 minutes for pericare and orthostatic BPs but very fatigued. Needed max cues for safety/sequencing with pivot to recliner  Ambulation/Gait             General Gait Details: unable- fatigue/safety   Stairs             Wheelchair Mobility    Modified Rankin (Stroke Patients Only)       Balance Overall balance assessment: Needs assistance Sitting-balance support: No upper extremity supported;Feet supported Sitting balance-Leahy Scale: Fair Sitting balance - Comments: close S statically Postural control: Posterior lean Standing balance support: Bilateral upper extremity supported;During functional activity Standing balance-Leahy Scale: Poor Standing balance comment: reliant on BUE support and external support, tends to lean posteriorly and braces legs on bed to balance                            Cognition Arousal/Alertness:  Awake/alert Behavior During Therapy: WFL for tasks assessed/performed Overall Cognitive Status: Within Functional Limits for tasks assessed                                 General Comments: very pleasant and cooperative with therapy, motivated to improve      Exercises      General Comments General comments (skin integrity, edema, etc.): SpO2  89 at lowest, mostly in the 90s with activity; highest observed HR 111BPM with activity      Pertinent Vitals/Pain Pain Assessment: 0-10 Pain Score: 3  Pain Location: back pain with extended activities Pain Descriptors / Indicators: Aching;Discomfort;Sore Pain Intervention(s): Limited activity within patient's tolerance;Monitored during session    Home Living                      Prior Function            PT Goals (current goals can now be found in the care plan section) Acute Rehab PT Goals Patient Stated Goal: Return home when I'm stronger. PT Goal Formulation: With patient Time For Goal Achievement: 08/20/21 Potential to Achieve Goals: Good Progress towards PT goals: Progressing toward goals    Frequency    Min 4X/week      PT Plan Current plan remains appropriate    Co-evaluation              AM-PAC PT "6 Clicks" Mobility   Outcome Measure  Help needed turning from your back to your side while in a flat bed without using bedrails?: A Little Help needed moving from lying on your back to sitting on the side of a flat bed without using bedrails?: A Lot Help needed moving to and from a bed to a chair (including a wheelchair)?: Total Help needed standing up from a chair using your arms (e.g., wheelchair or bedside chair)?: Total Help needed to walk in hospital room?: Total Help needed climbing 3-5 steps with a railing? : Total 6 Click Score: 9    End of Session Equipment Utilized During Treatment: Gait belt Activity Tolerance: Patient tolerated treatment well Patient left: in chair;with call bell/phone within reach Nurse Communication: Mobility status;Need for lift equipment PT Visit Diagnosis: Muscle weakness (generalized) (M62.81);Difficulty in walking, not elsewhere classified (R26.2);Other symptoms and signs involving the nervous system (R29.898);Hemiplegia and hemiparesis Hemiplegia - Right/Left: Left Hemiplegia - dominant/non-dominant:  Non-dominant Hemiplegia - caused by: Cerebral infarction     Time: 1601-0932 PT Time Calculation (min) (ACUTE ONLY): 43 min  Charges:  $Therapeutic Activity: 8-22 mins (co-tx with OT)                    Ann Lions PT, DPT, PN2   Supplemental Physical Therapist [place]    Pager [phone] Acute Rehab Office [phone]

## 2021-08-08 NOTE — Progress Notes (Signed)
STROKE TEAM PROGRESS NOTE      SUBJECTIVE: Patient is sitting up in bed.  She states her speech is improved but her balance and walking is still not back to normal.  She has been evaluated by therapist who recommended inpatient rehab.  MRI scan shows left paramedian pontine lacunar stroke.  Echocardiogram shows normal ejection fraction without cardiac source of embolism.  Carotid ultrasound showed no significant extracranial stenosis.  She was on Eliquis prior to admission for paroxysmal A. fib history.  Patient states she not been able to ambulate without assistance for nearly a year due to bilateral ulcers on her feet and uses mostly a wheelchair to ambulate and needs 1 person assist to make it from her bed to the wheelchair and   PERTINENT IMAGING: CT-scan of the brain 08/04/2021: No acute intracranial abnormality seen.   MRI BRAIN 1. Small acute infarct in the left pons. No significant edema or mass effect. 2. Severe chronic microvascular ischemic disease involving the pons and left middle cerebellar peduncle with many remote lacunar infarcts, progressed from 2019. Milder chronic microvascular ischemic disease elsewhere. 3. Remote pontine microhemorrhage, potentially hypertensive.   Echocardiogram 08/05/2021: 1. Left ventricular ejection fraction, by estimation, is 60 to 65%. The left ventricle has normal function. The left ventricle has no regional wall motion abnormalities. There is mild left ventricular hypertrophy.  Left ventricular diastolic parameters are indeterminate.   2. Right ventricular systolic function is normal. The right ventricular size is normal. There is normal pulmonary artery systolic pressure.   3. The mitral valve is normal in structure. No evidence of mitral valve regurgitation. No evidence of mitral stenosis.   4. The aortic valve is tricuspid. Aortic valve regurgitation is not visualized. Mild to moderate aortic valve sclerosis/calcification is present, without any  evidence of aortic stenosis.   5. The inferior vena cava is normal in size with greater than 50% respiratory variability, suggesting right atrial pressure of 3 mmHg.   OBJECTIVE Vitals:   08/08/21 0553 08/08/21 0611 08/08/21 0729 08/08/21 1131  BP:   (!) 153/59 (!) 143/55  Pulse:  62 62 62  Resp:  '16 19 18  '$ Temp:   98.8 F (37.1 C) 100.1 F (37.8 C)  TempSrc:   Oral Oral  SpO2:  97% 95% 90%  Weight: 122.9 kg 123.4 kg    Height:        CBC:  Recent Labs  Lab 08/04/21 1647 08/05/21 0440 08/06/21 0102  WBC 4.5 4.6 5.7  NEUTROABS 2.3  --   --   HGB 11.3* 10.3* 11.1*  HCT 36.0 33.3* 34.8*  MCV 92.3 92.5 89.9  PLT 173 78* [id number]    Basic Metabolic Panel:  Recent Labs  Lab 08/04/21 2303 08/05/21 0440 08/05/21 1238 08/06/21 0102  NA  --    < > 139 140  K  --    < > 4.0 3.7  CL  --    < > 107 108  CO2  --    < > 25 26  GLUCOSE  --    < > 252* 67*  BUN  --    < > 24* 21  CREATININE  --    < > 1.25* 1.06*  CALCIUM  --    < > 8.9 8.8*  MG 1.8  --   --   --    < > = values in this interval not displayed.    Lipid Panel:  Recent Labs  Lab 08/04/21 2313  CHOL  102  TRIG 39  HDL 44  CHOLHDL 2.3  VLDL 8  LDLCALC 50   HgbA1c:  Lab Results  Component Value Date   HGBA1C 10.3 (H) 08/04/2021   Urine Drug Screen:     Component Value Date/Time   LABOPIA NONE DETECTED 03/07/2020 1704   COCAINSCRNUR NONE DETECTED 03/07/2020 1704   LABBENZ NONE DETECTED 03/07/2020 1704   AMPHETMU NONE DETECTED 03/07/2020 1704   THCU NONE DETECTED 03/07/2020 1704   LABBARB NONE DETECTED 03/07/2020 1704    Alcohol Level No results found for: [facility]  IMAGING  Results for orders placed or performed during the hospital encounter of 08/04/21  MR Brain W and Wo Contrast   Narrative   CLINICAL DATA:  Neuro deficit, acute, stroke suspected  EXAM: MRI HEAD WITHOUT AND WITH CONTRAST  TECHNIQUE: Multiplanar, multiecho pulse sequences of the brain and surrounding structures were obtained  without and with intravenous contrast.  CONTRAST:  64m GADAVIST GADOBUTROL 1 MMOL/ML IV SOLN  COMPARISON:  CT head 08/04/2021.  MRI October 14, 2018.  FINDINGS: Brain: Small acute infarct in the left pons. No significant edema or mass effect. Prominent T2/FLAIR hyperintensity of the pons, likely representing severe microvascular ischemic change with multiple small remote pontine lacunar infarcts. Additional T2/FLAIR hyperintensity of the left middle cerebellar peduncle, likely representing Wallerian type degeneration with focal t2 hyperintensity in this region likely an additional remote lacunar infarct. Milder supratentorial chronic microvascular ischemic disease with scattered white matter T2 hyperintensities. No hydrocephalus. No acute hemorrhage. No extra-axial fluid collections. No midline shift. No abnormal enhancement. Punctate focus of susceptibility artifact in the right pons, compatible with prior microhemorrhage.  Vascular: Major arterial flow voids are maintained at the skull base.  Skull and upper cervical spine: Normal marrow signal.  Sinuses/Orbits: Clear visualized sinuses.  Unremarkable orbits.  Other: No sizable mastoid effusions.  IMPRESSION: 1. Small acute infarct in the left pons. No significant edema or mass effect. 2. Severe chronic microvascular ischemic disease involving the pons and left middle cerebellar peduncle with many remote lacunar infarcts, progressed from 2019. Milder chronic microvascular ischemic disease elsewhere. 3. Remote pontine microhemorrhage, potentially hypertensive.   Electronically Signed   By: FMargaretha SheffieldM.D.   On: 08/05/2021 14:33   CT HEAD WO CONTRAST (5MM)   Narrative   CLINICAL DATA:  Acute left lower extremity weakness.  EXAM: CT HEAD WITHOUT CONTRAST  TECHNIQUE: Contiguous axial images were obtained from the base of the skull through the vertex without intravenous contrast.  COMPARISON:  March 07, 2020.  FINDINGS: Brain: No evidence of acute infarction, hemorrhage, hydrocephalus, extra-axial collection or mass lesion/mass effect.  Vascular: No hyperdense vessel or unexpected calcification.  Skull: Normal. Negative for fracture or focal lesion.  Sinuses/Orbits: No acute finding.  Other: None.  IMPRESSION: No acute intracranial abnormality seen.   Electronically Signed   By: JMarijo ConceptionM.D.   On: 08/04/2021 18:08   Results for orders placed or performed during the hospital encounter of 10/13/18  MR BRAIN WO CONTRAST   Narrative   CLINICAL DATA:  Follow up stroke. History of stroke, hyperlipidemia, hypertension, diabetes and thyroid cancer.  EXAM: MRI HEAD WITHOUT CONTRAST  TECHNIQUE: Multiplanar, multiecho pulse sequences of the brain and surrounding structures were obtained without intravenous contrast.  COMPARISON:  CT HEAD October 14, 2018 and MRI head August 29, 2015  FINDINGS: Multiple sequences are moderately motion degraded.  INTRACRANIAL CONTENTS: 6 mm acute RIGHT pontine infarct at superior cerebellar peduncle with low ADC  values. A few scattered chronic microhemorrhages noted. Patchy supratentorial white matter T2 hyperintensities. Old pontine lacunar infarcts. Prominent basal ganglia perivascular spaces associated with chronic small vessel ischemic changes. No parenchymal brain volume loss for age. No midline shift, mass effect or masses. No abnormal extra-axial fluid collections. Basal cisterns are patent.  VASCULAR: Normal major intracranial vascular flow voids present at skull base.  SKULL AND UPPER CERVICAL SPINE: No abnormal sellar expansion. No suspicious calvarial bone marrow signal. Craniocervical junction maintained.  SINUSES/ORBITS: The mastoid air-cells and included paranasal sinuses are well-aerated.The included ocular globes and orbital contents are non-suspicious. Status post bilateral ocular lens implants.  OTHER:  Patient is edentulous.  IMPRESSION: 1. Motion degraded examination. Acute subcentimeter RIGHT pontine infarct. 2. 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   Electronically Signed   By: Elon Alas M.D.   On: 10/14/2018 20:29     ROS:                                                                                                                                       History obtained from chart review and the patient  General: Negative for: Chills, fatigue, fever Psychological: Negative for: Hallucinations, mood swings Ophthalmic: Negative for: Blurry vision, double vision ENT: Negative for: vertigo Respiratory: Negative for: Cough,  shortness of breath Cardiovascular: Negative for: Chest pain Gastrointestinal: Negative for: Abdominal pain, diarrhea, nausea/vomiting Musculoskeletal: Negative for: Joint swelling or muscular weakness; pain on right ankle AS noted in the HPI  Neurological: As noted in HPI   General Exam__________________________________________________________ Pleasant elderly African-American mildly obese lady not in distress HEENT-  Normocephalic, no lesions, without obvious abnormality.  Normal external eye and conjunctiva.  Wears glasses.  Mostly edentulous. Cardiovascular-regular rate and rhythm, on telemetry Lungs-breathing comfortably on room air Extremities-approximately 5 inch bandage wrapping of the right ankle Musculoskeletal-no joint tenderness, deformity or swelling Skin-visible skin warm and dry, no hyperpigmentation, vitiligo, or suspicious lesions  Neurologic Exam:_______________________________________________________ General: NAD Mental Status: Alert, oriented, thought content appropriate.  Speech is dysarthric (baseline) fluent without evidence of aphasia.  Able to follow 3 step commands without difficulty. Cranial Nerves: II:  Visual fields grossly normal, pupils equal, round and reactive to  light III,IV, VI: Ptosis not present, extra-ocular motions intact bilaterally V,VII: Smile symmetric, facial light touch sensation normal bilaterally VIII: Hearing normal bilaterally IX,X: Phonation normal XI: Shoulder shrug with equal strength bilaterally XII: Midline tongue extension without atrophy or fasciculations  Motor: Right : Upper extremity   4/5    Left:     Upper extremity   5/5  Lower extremity   4/5     Lower extremity   5/5 Lower extremity movement is at the ankle, unable to move the knee or hip off the bed secondary to body habitus and weakness, 2-3/5 bilaterally Tone and Bulk: Normal throughout; no atrophy noted Sensory: Light touch intact throughout, equal bilaterally.  Patient does not endorse hyperesthesias on my visit. Deep Tendon Reflexes:  Right: Upper Extremity:   Biceps (C-5 to C-6) 2/4       Brachioradialis (C6) 1/4   Left Upper Extremity: Biceps (C-5 to C-6) 2/4 Brachioradialis (C6) 1/4  Right Lower Extremity Quadriceps (L-2 to L-4) 1/4     Left Lower Extremity Quadriceps (L-2 to L-4) 1/4      Plantars: Downgoing bilaterally Cerebellar: FTN without evidence of ataxia or dysmetria Gait: Deferred, patient uses an electric wheelchair for transportation with the ability to transfer and ambulate a few steps at a time  ASSESSMENT/PLAN Ms. [name] Laranda Jardines is a 70 y.o. female with history of Ms. 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 fib on Eliquis, hyperlipidemia, hypertension, intracranial vascular stenosis, type I DM, thyroid cancer status post total thyroidectomy, complete heart block status post PPM, obesity and heart failure preserved ejection fraction who presented to [facility] ED with complaints of being unable to stand, and slurred speech.  She believes she was having a stroke at the time. NIH stroke scale of 5.  No acute abnormality  seen on CT, small acute infarct in the left pons on MRI.  Consistent with previous examinations, I find 4 out of 5 strength in the right upper and lower extremity versus the left, which is inconsistent with patient report.  This similar to previous assessments the patient did not appreciate hyperesthesia of the lower extremities on exam.    New left pontine stroke with right-sided hemiparesis-likely small vessel etiology History of CVA with left-sided weakness and dysarthria and prior history of paroxysmal A. fib on long-term anticoagulation with Eliquis CT head as above MRI head as above TCD pending Carotid Doppler bilateral 1-39% carotid stenosis 2D Echo ejection fraction 60-65%. LDL 50, continue home statin therapy HgbA1c 10.3, goal <7, continue SSI SCDs for VTE prophylaxis Diet Order             Diet heart healthy/carb modified Room service appropriate? Yes; Fluid consistency: Thin  Diet effective now                  Eliquis (apixaban) daily prior to admission, continue Ongoing aggressive stroke risk factor management Therapy recommendations: PT recommends CIR Disposition: To be determined when medically appropriate  Hypertension Continue home medications Permissive hypertension (OK if < 220/120) but gradually normalize in 5-7 days Long-term BP goal normotensive  Hyperlipidemia Home meds: Atorvastatin 80 mg, continued in hospital LDL 50, goal < 70 Continue statin at discharge  Diabetes type II HgbA1c 10.3, goal < 7.0 Continue sliding scale insulin in the hospital.  Other Stroke Risk Factors Advanced age Obesity, Body mass index is 42.61 kg/m., recommend weight loss, diet and exercise as appropriate  Hx stroke/TIA Coronary artery disease  Hospital day # 4  Patient presented with left paramedian pontine stroke secondary likely to small vessel disease even though she has history of paroxysmal A. fib and is on long-term anticoagulation with Eliquis.  Recommend  continue Eliquis and addition of aspirin has not shown to be of any added benefit but will increase the risk of bleeding.  Continue ongoing therapy evaluation and likely transfer to inpatient rehab when bed available.  Maintain aggressive risk factor modification.  Patient counseled to be compliant with using the CPAP for sleep apnea.  Greater than 50% time during this 25-minute visit was spent on counseling and coordination  of care and discussion with care team.  Stroke team will sign off.  Kindly call for questions. Antony Contras, MD To contact Stroke Continuity provider, please refer to [email or web address] After hours, contact General Neurology

## 2021-08-09 LAB — BASIC METABOLIC PANEL
Anion gap: 9 (ref 5–15)
BUN: 14 mg/dL (ref 8–23)
CO2: 25 mmol/L (ref 22–32)
Calcium: 8.9 mg/dL (ref 8.9–10.3)
Chloride: 103 mmol/L (ref 98–111)
Creatinine, Ser: 1.1 mg/dL — ABNORMAL HIGH (ref 0.44–1.00)
GFR, Estimated: 54 mL/min — ABNORMAL LOW (ref >60)
Glucose, Bld: 117 mg/dL — ABNORMAL HIGH (ref 70–99)
Potassium: 3.8 mmol/L (ref 3.5–5.1)
Sodium: 137 mmol/L (ref 135–145)

## 2021-08-09 LAB — GLUCOSE, CAPILLARY
Glucose-Capillary: 95 mg/dL (ref 70–99)
Glucose-Capillary: 145 mg/dL — ABNORMAL HIGH (ref 70–99)
Glucose-Capillary: 259 mg/dL — ABNORMAL HIGH (ref 70–99)
Glucose-Capillary: 149 mg/dL — ABNORMAL HIGH (ref 70–99)
Glucose-Capillary: 82 mg/dL (ref 70–99)
Glucose-Capillary: 262 mg/dL — ABNORMAL HIGH (ref 70–99)
Glucose-Capillary: 171 mg/dL — ABNORMAL HIGH (ref 70–99)

## 2021-08-09 MED ORDER — LORATADINE 10 MG PO TABS
10.0000 mg | ORAL_TABLET | Freq: Every day | ORAL | Status: DC
  Administered 2021-08-09 – 2021-08-10 (×2): 10 mg via ORAL
  Filled 2021-08-09 (×2): qty 1

## 2021-08-09 MED ORDER — CRANBERRY EXTRACT 250 MG PO TABS: 1.0000 | ORAL_TABLET | Freq: Every day | ORAL | Status: DC

## 2021-08-09 MED ORDER — OCUVITE-LUTEIN PO CAPS: 1.0000 | ORAL_CAPSULE | Freq: Every day | ORAL | Status: DC

## 2021-08-09 MED ORDER — PROSIGHT PO TABS
1.0000 | ORAL_TABLET | Freq: Every day | ORAL | Status: DC
  Administered 2021-08-09 – 2021-08-10 (×2): 1 via ORAL
  Filled 2021-08-09 (×2): qty 1

## 2021-08-09 MED ORDER — POTASSIUM CHLORIDE CRYS ER 20 MEQ PO TBCR
20.0000 meq | EXTENDED_RELEASE_TABLET | Freq: Once | ORAL | Status: AC
  Administered 2021-08-09: 20 meq via ORAL
  Filled 2021-08-09: qty 1

## 2021-08-09 NOTE — Progress Notes (Addendum)
Inpatient Diabetes Program Recommendations  AACE/ADA: New Consensus Statement on Inpatient Glycemic Control (2015)  Target Ranges:  Prepandial:   less than 140 mg/dL      Peak postprandial:   less than 180 mg/dL (1-2 hours)      Critically ill patients:  140 - 180 mg/dL   Lab Results  Component Value Date   GLUCAP 259 (H) 08/09/2021   HGBA1C 10.3 (H) 08/04/2021    Review of Glycemic Control   Diabetes history: DM1 Outpatient Diabetes medications: 630 Medtronic insulin pump Current orders for Inpatient glycemic control: Novolog 0-15 units tid started 0800 am today, Restarted insulin pump   Inpatient Diabetes Program Recommendations:    Settings on pump: 12 a-3a-0.5 units/hr 3a-9a 0.8 units/hr 9a-12n  1.3 units/hr 12n-1:30p 0.95 units/hr 1:30p-3p 1.2 units/hr 3p-6 p 1.10 units/hr 6p-7:30p 1.15 units/hr 7:30-12 a 0.95 units/hr Total basal = 22.7 units daily  Spoke with patient again as patient CBGs >300's mg/dL following meals. In further discussing, patient has been drinking 15-30 CHO of orange juice in the middle of night to prevent lows. AM CBGs have ranged from 95-154 mg/dL Patient no longer wearing Dexcom. Offered [employer]; not interested in application. Using CBGs obtained with nursing, despite encouragement.  Further insulin pump settings obtained.   Carb Coverage 0000-1000 1:6 1 unit for every 6 grams of carbohydrates 1001-2130 1:5 1 unit for every 5 grams of carbohydrates 2131-0000 1:6 1 unit for every 6 grams of carbohydrates  Insulin Sensitivity 0000-0500 1:60 1 unit drops blood glucose 60 mg/dl 0000-0500 1:50 1 unit drops blood glucose 50 mg/dl 0000-0500 1:60 1 unit drops blood glucose 60 mg/dl  Target Glucose Goals 0000-0000 140-140 mg/dl  Dr Chalmers Cater, outpatient endocrinologist paged for insulin pump setting adjustments.  Addendum '@1721'$ : Awaiting call back.  Thanks, Bronson Curb, MSN, RNC-OB Diabetes Coordinator [phone] (8a-5p)

## 2021-08-09 NOTE — Progress Notes (Signed)
RT note. Patient placed on auto cpap for the night

## 2021-08-09 NOTE — Progress Notes (Signed)
Pt has requested for home meds to be added to the [facility], requested meds were Allegra, Ocuvite, and Cranberry. Paged Family medicine teaching services and made MD aware. Awaiting new orders.

## 2021-08-09 NOTE — Progress Notes (Signed)
FPTS Interim Progress Note  S: Patient is awake and comfortable in recliner next to bed.  States she is doing fine does not have any questions or concerns at this time.  Patient stated she would like to get in bed admitted help with this.  O: BP 126/72 (BP Location: Right Arm)   Pulse 73   Temp 98.4 F (36.9 C) (Oral)   Resp 20   Ht '5\' 7"'$  (1.702 m)   Wt 121.7 kg   SpO2 95%   BMI 42.02 kg/m     A/P: No changes to make regarding medical management at this time.  Discussed with nurse that patient would like to return to bed.  Wells Guiles, DO 08/09/2021, 10:07 PM PGY-1, [place] Medicine Service pager [phone]

## 2021-08-09 NOTE — Progress Notes (Signed)
Family Medicine Teaching Service Daily Progress Note Intern Pager: [phone]  Patient name: [name] Medical record number: [id number] Date of birth: 1951-06-10 Age: 70 y.o. Gender: female  Primary Care Provider: Eulis Foster, MD Consultants: Neuro (s/o) Code Status: Full  Pt Overview and Major Events to Date:  8/11- admitted 8/12- MRI showing new acute infarct of L pons  Assessment and Plan:  [name] is a 70 yo female who presented with weakness and transient dysarthria, found to have acute L pontine infarct. PMH significant for T1DM, prior CVA with residual L sided deficits, A Fib on Eliquis, hx complete heart block s/p pacemaker, HTN, HLD, obesity, OSA, HFpEF, GERD, hx thyroid CA s/p total thyroidectomy. She is stable for discharge to CIR when a bed comes available.   Acute left pontine CVA Stable at this time. Doing well. Continues to have mild R sided weakness. Transient dysarthria present at beginning of visit this morning but quickly subsided.  -Stable for CIR -PT/OT has seen  -Eliquis '5mg'$  2xdaily, lipitor '80mg'$  daily  OSA  HFpEF  HTN BiPaP used at home. EF-60-65%. BP: 126/45, 116/61, 134/50 -Torsemide '10mg'$  daily -Amlodipine '10mg'$  daily started today -Will hold losartan home dose for now  HLD Patient takes fish oil at home, home dose is 800 mg daily. -Continue Lipitor -Will add on omega-3 fish oil 1000 mg   Constipation, resolved - Continue MiraLAX and Senna   T1DM Glucose 95-357. CBGs-145, 95. - Continue Insulin pump - Diabetes coordinator following   A Fib Hx of complete AV block s/p pacemaker Rate controlled. - Continue Eliquis   Pressure ulcer L Heel - Continue Prevalon boots - Elevate heel off bed   Depression, chronic, stable - Continue Prozac '30mg'$  daily   S/p total thyroidectomy - Continue levothyroxine   GERD, chronic, stable - Continue Protonix '40mg'$  BID   FEN/GI: Heart healthy, carb modified PPx:  Eliquis Dispo:CIR today. Barriers include placement.   Subjective:  Pt seems to be doing ok today. She notes that she is "very stiff." Overall, she is in good spirits.   Objective: Temp:  [98.5 F (36.9 C)-98.8 F (37.1 C)] 98.8 F (37.1 C) (08/16 1128) Pulse Rate:  [60-69] 69 (08/16 1128) Resp:  [15-25] 18 (08/16 1128) BP: (116-158)/(45-62) 134/50 (08/16 1128) SpO2:  [90 %-97 %] 95 % (08/16 1128) Weight:  [121.7 kg] 121.7 kg (08/16 0604)  Physical Exam: General: Comfortable resting in bed, NAD Cardiovascular: No murmur found, paced rhythm Respiratory: NOB, CTAB Abdomen: nontender to palpation, normal BS Extremities: Prevalon boots in place Neuro: Mild slurring when starting speech, no facial asymmetry, R sided weaker than L on exam today    Laboratory: Recent Labs  Lab 08/04/21 1647 08/05/21 0440 08/06/21 0102  WBC 4.5 4.6 5.7  HGB 11.3* 10.3* 11.1*  HCT 36.0 33.3* 34.8*  PLT 173 78* 167   Recent Labs  Lab 08/04/21 1647 08/04/21 2303 08/05/21 0440 08/05/21 1238 08/06/21 0102 08/09/21 0308  NA 140  --    < > 139 140 137  K 3.8  --    < > 4.0 3.7 3.8  CL 106  --    < > 107 108 103  CO2 26  --    < > '25 26 25  '$ BUN 20  --    < > 24* 21 14  CREATININE 1.28*  --    < > 1.25* 1.06* 1.10*  CALCIUM 9.0  --    < > 8.9 8.8* 8.9  PROT 6.7  6.0*  --   --   --   --   BILITOT 0.8 0.5  --   --   --   --   ALKPHOS 91 78  --   --   --   --   ALT 24 22  --   --   --   --   AST 27 21  --   --   --   --   GLUCOSE 152*  --    < > 252* 67* 117*   < > = values in this interval not displayed.     Imaging/Diagnostic Tests: No new test today  Erskine Emery, MD 08/09/2021, 1:24 PM PGY-1, [place] Intern pager: [phone], text pages welcome

## 2021-08-09 NOTE — Progress Notes (Signed)
Physical Therapy Treatment Patient Details Name: [name] MRN: [id number] DOB: Oct 18, 1951 Today's Date: 08/09/2021    History of Present Illness 70 y.o. 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 Imaging confirmed acute pontine stroke.    PT Comments    Patient received in bed, very pleasant and cooperative- excited to get back up to the recliner today. Still requires Mod-MaxAx2 with RW to stand and pivot to the recliner. Has a hard time with proprioception in left LE and needs heavy cues for sequencing with transfer activity; also has delayed responses to LOB dynamically. More SOB today with desat as low as 85% on room air, but able to recover back to >90% with PLB. Left up in recliner with all needs met, chair alarm active. Will continue to follow.    Follow Up Recommendations  CIR     Equipment Recommendations  None recommended by PT (TBD next venue)    Recommendations for Other Services       Precautions / Restrictions Precautions Precautions: Fall Precaution Comments: watch gait belt placement - insulin pump Rt abdomen Restrictions Weight Bearing Restrictions: No    Mobility  Bed Mobility Overal bed mobility: Needs Assistance       Supine to sit: Min assist;Mod assist;+2 for physical assistance     General bed mobility comments: able to get most of the way up to EOB with MinA, did need as much as ModA to scoot hips all the way forward to EOB    Transfers Overall transfer level: Needs assistance Equipment used: Rolling walker (2 wheeled) Transfers: Sit to/from [employer] Sit to Stand: Max assist;+2 physical assistance Stand pivot transfers: Mod assist;+2 physical assistance       General transfer comment: able to boost into standing with MaxAX2 from standard height bed but with significant posterior lean- needed heavy cues to bring weight forward and push down on RW instead of pulling it backwards. Able to pivot to recliner with ModAx2, heavy assist given for RW management and and Mod cues for sequencing/safety. Very poor proprioception L LE, but slightly improved with weight of shoes vs socks (used yesterday)  Ambulation/Gait             General Gait Details: unable- fatigue/safety   Stairs             Wheelchair Mobility    Modified Rankin (Stroke Patients Only)       Balance Overall balance assessment: Needs assistance Sitting-balance support: No upper extremity supported;Feet supported Sitting balance-Leahy Scale: Fair Sitting balance - Comments: close S statically   Standing balance support: Bilateral upper extremity supported;During functional activity Standing balance-Leahy Scale: Poor Standing balance comment: reliant on BUE support and external support, tends to lean posteriorly and braces legs on bed to balance. Difficult for her to correct posterior balance loss.                            Cognition Arousal/Alertness: Awake/alert Behavior During Therapy: WFL for tasks assessed/performed Overall Cognitive Status: Within Functional Limits for tasks assessed  General Comments: very pleasant and cooperative with therapy, motivated to improve      Exercises      General Comments General comments (skin integrity, edema, etc.): more SOB today- did seem to have short periods of desat as low as 85% with activity on room air, but able to recover into the 90s with PLB      Pertinent Vitals/Pain Pain Assessment: Faces Faces Pain Scale: Hurts a little  bit Pain Location: mild generalized discomfort with activity Pain Descriptors / Indicators: Aching;Discomfort;Sore Pain Intervention(s): Limited activity within patient's tolerance;Monitored during session    Home Living                      Prior Function            PT Goals (current goals can now be found in the care plan section) Acute Rehab PT Goals Patient Stated Goal: Return home when I'm stronger. PT Goal Formulation: With patient Time For Goal Achievement: 08/20/21 Potential to Achieve Goals: Good Progress towards PT goals: Progressing toward goals    Frequency    Min 4X/week      PT Plan Current plan remains appropriate    Co-evaluation              AM-PAC PT "6 Clicks" Mobility   Outcome Measure  Help needed turning from your back to your side while in a flat bed without using bedrails?: A Little Help needed moving from lying on your back to sitting on the side of a flat bed without using bedrails?: A Lot Help needed moving to and from a bed to a chair (including a wheelchair)?: Total Help needed standing up from a chair using your arms (e.g., wheelchair or bedside chair)?: Total Help needed to walk in hospital room?: Total Help needed climbing 3-5 steps with a railing? : Total 6 Click Score: 9    End of Session Equipment Utilized During Treatment: Gait belt Activity Tolerance: Patient tolerated treatment well Patient left: in chair;with call bell/phone within reach;with chair alarm set Nurse Communication: Mobility status;Need for lift equipment PT Visit Diagnosis: Muscle weakness (generalized) (M62.81);Difficulty in walking, not elsewhere classified (R26.2);Other symptoms and signs involving the nervous system (R29.898);Hemiplegia and hemiparesis Hemiplegia - Right/Left: Left Hemiplegia - dominant/non-dominant: Non-dominant Hemiplegia - caused by: Cerebral infarction     Time: 8295-6213 PT Time Calculation (min) (ACUTE ONLY): 20  min  Charges:  $Therapeutic Activity: 8-22 mins                    Windell Norfolk, DPT, PN2   [facility]    Pager [phone] Acute Rehab Office [phone]

## 2021-08-09 NOTE — Progress Notes (Signed)
FPTS Interim Night Progress Note  S:Patient sleeping comfortably.  Rounded with primary night RN.  No concerns voiced.  No orders required.    O: Today's Vitals   08/09/21 0100 08/09/21 0340 08/09/21 0604 08/09/21 0720  BP:  116/61  (!) 126/45  Pulse: 65 69  65  Resp: '17 15  18  '$ Temp:  98.5 F (36.9 C)  98.6 F (37 C)  TempSrc:  Axillary  Axillary  SpO2: 95% 95%  95%  Weight:   121.7 kg   Height:      PainSc:          A/P: Continue current management  Carollee Leitz MD PGY-3, [facility] pager [phone]

## 2021-08-09 NOTE — Care Management Important Message (Signed)
Important Message  Patient Details  Name: [name] MRN: [id number] Date of Birth: [birth year]   Medicare Important Message Given:  Yes     Raymondo Garcialopez [address] 08/09/2021, 4:34 PM

## 2021-08-09 NOTE — H&P (Signed)
Physical Medicine and Rehabilitation Admission H&P    Chief Complaint  Patient presents with   Extremity Weakness  : HPI: [name] is a 70 year old right-handed female with history of diabetes mellitus with insulin pump, prior CVA with residual left-sided weakness as well as dysarthria, atrial fibrillation maintained on Eliquis, diastolic congestive heart failure, history of complete heart block status post pacemaker 06/25/2020 per Dr. Curt Bears, OSA with CPAP, hypertension, hyperlipidemia, obesity, thyroid cancer with thyroidectomy and obesity with BMI 42.02.  History taken from chart review and patient. Patient lives with relatives.  Multilevel home bed and bath main level with ramped entrance.  Ambulates short distances with platform Rollator.  Granddaughter assist with set up for sponge bath and basic ADLs.  She presented on 08/04/2021 with increasing left hemiparesis and worsening dysarthria.  CT/MRI showed small acute infarct in the left pons.  No significant edema or mass-effect.  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  Remote pontine microhemorrhage.  Echocardiogram with ejection fraction of 60 to 65%, no wall motion abnormalities.  Admission chemistries unremarkable except glucose 152, creatinine 1.28, BNP 130.6.  Patient did not receive tPA.  Neurology follow-up patient currently remains on Eliquis as prior to admission.  Her insulin pump is being utilized for diabetes mellitus.  WOC follow-up 08/05/2021 for left heel and right shin wounds with skin care as directed.  Tolerating a regular consistency diet.  Therapy evaluations completed due to patient's left-sided hemiparesis and dysarthric speech was admitted for a comprehensive rehab program.  Please see preadmission assessment from earlier today as well.  Review of Systems  Constitutional:  Negative for chills and fever.   HENT:  Negative for hearing loss.   Eyes:  Negative for blurred vision and double vision.  Respiratory:  Negative for cough and shortness of breath.   Cardiovascular:  Positive for palpitations and leg swelling. Negative for chest pain.  Gastrointestinal:  Positive for constipation. Negative for heartburn, nausea and vomiting.       GERD  Genitourinary:  Negative for dysuria, flank pain and hematuria.  Musculoskeletal:  Negative for back pain, joint pain and myalgias.       History of falls with left humeral fracture  Skin:  Negative for rash.  Neurological:  Positive for dizziness, sensory change and weakness.  Psychiatric/Behavioral:  Positive for depression.   All other systems reviewed and are negative. Past Medical History:  Diagnosis Date   Abnormal echocardiogram    Anemia    Arthritis    BPPV (benign paroxysmal positional vertigo), right 06/13/2017   Bradycardia 03/07/2020   Cerebrovascular accident (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 ([place]) 10/29/2015   CHB (complete heart block) ([place]) 03/08/2020   CHF (congestive heart failure) ([place])    Closed fracture of left proximal humerus 07/08/2019   Decreased range of motion of left shoulder 09/03/2020   Depression    Dizzy 03/07/2020   bradycardia   Edentulous 09/03/2020   GERD (gastroesophageal reflux disease)    Hemiparesis affecting left side as late effect of cerebrovascular accident (CVA) ([place]) 03/08/2020   Hemorrhoids    History of Falls with injury 03/08/2020   Fall resulting in left humeral fracture   Hyperlipidemia    Hypertension    Hypothyroidism    Intracranial vascular stenosis    Lacunar infarction [facility])    Brain MRI 07/2021 multiple small remote pontine lacunar infarcts   Near syncope 03/08/2020  Paroxysmal atrial fibrillation (HCC)    Presence of permanent cardiac pacemaker    Proteinuria 03/08/2020   Seasonal allergies    Stroke [facility])    Thyroid cancer ([place])    per pt S/p Total Thyroidectomy  with Radioactive Iodine Therapy   Type 1 diabetes mellitus ([place])    Past Surgical History:  Procedure Laterality Date   ABDOMINAL ANGIOGRAM N/A 05/14/2012   Procedure: ABDOMINAL ANGIOGRAM;  Surgeon: Laverda Page, MD;  Location: [facility] CATH LAB;  Service: Cardiovascular;  Laterality: N/A;   ABDOMINAL HYSTERECTOMY  1995   partial   ANKLE FRACTURE SURGERY Right    CARDIAC CATHETERIZATION     with coronary angiogram   cataract  Bilateral 2018   COLONOSCOPY N/A 09/09/2014   Procedure: COLONOSCOPY;  Surgeon: Gatha Mayer, MD;  Location: [place];  Service: Endoscopy;  Laterality: N/A;   ESOPHAGOGASTRODUODENOSCOPY (EGD) WITH PROPOFOL N/A 12/03/2018   Procedure: ESOPHAGOGASTRODUODENOSCOPY (EGD) WITH PROPOFOL;  Surgeon: Doran Stabler, MD;  Location: [place];  Service: Gastroenterology;  Laterality: N/A;   LEFT AND RIGHT HEART CATHETERIZATION WITH CORONARY ANGIOGRAM N/A 05/14/2012   Procedure: LEFT AND RIGHT HEART CATHETERIZATION WITH CORONARY ANGIOGRAM;  Surgeon: Laverda Page, MD;  Location: [facility] CATH LAB;  Service: Cardiovascular;  Laterality: N/A;   LOOP RECORDER INSERTION N/A 12/23/2018   Procedure: LOOP RECORDER INSERTION;  Surgeon: Constance Haw, MD;  Location: [place] CV LAB;  Service: Cardiovascular;  Laterality: N/A;   LOOP RECORDER REMOVAL N/A 06/25/2020   Procedure: LOOP RECORDER REMOVAL;  Surgeon: Constance Haw, MD;  Location: [place] CV LAB;  Service: Cardiovascular;  Laterality: N/A;   MINI METER   09/08/2014   ELECTRONIC INSULIN PUMP   PACEMAKER IMPLANT N/A 06/25/2020   Procedure: PACEMAKER IMPLANT;  Surgeon: Constance Haw, MD;  Location: [place] CV LAB;  Service: Cardiovascular;  Laterality: N/A;   TEE WITHOUT CARDIOVERSION N/A 10/16/2018   Procedure: TRANSESOPHAGEAL ECHOCARDIOGRAM (TEE);  Surgeon: Buford Dresser, MD;  Location: [place];  Service: Cardiovascular;  Laterality: N/A;   THYROIDECTOMY  2006   Family History   Problem Relation Age of Onset   Heart disease Mother    Hyperlipidemia Mother    Hypertension Mother    Renal Disease Mother        insufficiency   Heart attack Mother        in his 98's   Early death Father        Head Injury at Work   Hyperlipidemia Father    Hypertension Father    Stroke Son    Diabetes Maternal Aunt    Prostate cancer Paternal Uncle    Heart disease Maternal Aunt    Heart failure Maternal Grandfather    Heart disease Maternal Grandfather    Heart attack Maternal Grandfather        Died in his 22's   Breast cancer Neg Hx    Social History:  reports that she has never smoked. She has never used smokeless tobacco. She reports that she does not drink alcohol and does not use drugs. Allergies:  Allergies  Allergen Reactions   Latex Rash   Medications Prior to Admission  Medication Sig Dispense Refill   ACCU-CHEK GUIDE test strip      acetaminophen (TYLENOL) 500 MG tablet Take 1,000 mg by mouth every 8 (eight) hours as needed for mild pain.      amLODipine (NORVASC) 10 MG tablet Take 1 tablet (10 mg total) by mouth daily.  90 tablet 1   apixaban (ELIQUIS) 5 MG TABS tablet Take 1 tablet (5 mg total) by mouth 2 (two) times daily. 60 tablet 9   atorvastatin (LIPITOR) 80 MG tablet Take 1 tablet (80 mg total) by mouth every evening. 90 tablet 2   baclofen (LIORESAL) 10 MG tablet Take 0.5 tablets (5 mg total) by mouth 2 (two) times daily as needed for muscle spasms. 30 each 0   Continuous Blood Gluc Transmit (DEXCOM G6 TRANSMITTER) MISC by Does not apply route. Checks her blood sugar     CRANBERRY EXTRACT PO Take 1 capsule by mouth daily.     fexofenadine (ALLEGRA) 180 MG tablet Take 180 mg by mouth daily as needed for allergies or rhinitis.     FLUoxetine (PROZAC) 10 MG capsule TAKE ONE CAPSULE BY MOUTH EVERY DAY WITH '20MG'$  DOSE (Patient taking differently: Take 10 mg by mouth daily.) 90 capsule 1   FLUoxetine (PROZAC) 20 MG tablet TAKE 1 TABLET BY MOUTH EVERY DAY  ALONG WITH 10 MG FOR A TOTAL DOSE OF 30 MG EVERY DAY (Patient taking differently: Take 20 mg by mouth daily.) 90 tablet 2   gabapentin (NEURONTIN) 300 MG capsule TAKE 1 CAPSULE(300 MG) BY MOUTH THREE TIMES DAILY (Patient taking differently: Take 300 mg by mouth 3 (three) times daily.) 270 capsule 2   insulin aspart (NOVOLOG) 100 UNIT/ML injection Inject 20-30 Units into the skin daily. To be used in patient's insulin pump 10 mL    Insulin Human (INSULIN PUMP) SOLN Inject 1 each into the skin continuous. Novolog insulin 1 each 0   levothyroxine (SYNTHROID) 175 MCG tablet Take 1 tablet (175 mcg total) by mouth daily before breakfast. 90 tablet 1   loperamide (IMODIUM A-D) 2 MG tablet Take 1 tablet (2 mg total) by mouth 4 (four) times daily as needed for diarrhea or loose stools. 30 tablet 2   losartan (COZAAR) 100 MG tablet Take 1 tablet (100 mg total) by mouth daily. 90 tablet 1   Multiple Vitamins-Minerals (OCUVITE PO) Take 1 tablet by mouth daily.     pantoprazole (PROTONIX) 40 MG tablet Take 1 tablet (40 mg total) by mouth 2 (two) times daily. 120 tablet 3   potassium chloride (KLOR-CON) 10 MEQ tablet Take 1 tablet (10 mEq total) by mouth daily. 90 tablet 1   torsemide (DEMADEX) 10 MG tablet TAKE 1 TABLET(10 MG) BY MOUTH DAILY AS NEEDED (Patient taking differently: Take 10 mg by mouth daily.) 90 tablet 0    Drug Regimen Review Drug regimen was reviewed and remains appropriate with no significant issues identified  Home: Home Living Family/patient expects to be discharged to:: Private residence Living Arrangements: Other relatives Available Help at Discharge: Family, Available PRN/intermittently Type of Home: House Home Access: Ramped entrance Home Layout: Two level, Able to live on main level with bedroom/bathroom Alternate Level Stairs-Number of Steps: stays on the main level Bathroom Shower/Tub: Other (comment) (pt reported she takes bird baths) Bathroom Toilet: Handicapped  height Bathroom Accessibility: No Home Equipment: Wheelchair - power, Grab bars - toilet, Environmental consultant - 4 wheels, Wheelchair - manual, Financial controller: [place] Additional Comments: [place]  Lives With: Family (sister, granddaughter)   Functional History: Prior Function Level of Independence: Needs assistance Gait / Transfers Assistance Needed: Short distances with platform rollator, stand pivots ADL's / Homemaking Assistance Needed: Granddaughter assists with setup for sponge bath, shoes and socks, and assists with her back.  Family assists with meds, meals, home management and community mobility  Functional Status:  Mobility: Bed Mobility Overal bed mobility: Needs Assistance Bed Mobility: Rolling Rolling: Min assist Sidelying to sit: Mod assist Supine to sit: Min assist, Mod assist, +2 for physical assistance Sit to sidelying: Max assist General bed mobility comments: able to get most of the way up to EOB with MinA, did need as much as ModA to scoot hips all the way forward to EOB Transfers Overall transfer level: Needs assistance Equipment used: Rolling walker (2 wheeled) Transfers: Sit to/from Stand, Stand Pivot Transfers Sit to Stand: Max assist, +2 physical assistance Stand pivot transfers: Mod assist, +2 physical assistance General transfer comment: able to boost into standing with MaxAX2 from standard height bed but with significant posterior lean- needed heavy cues to bring weight forward and push down on RW instead of pulling it backwards. Able to pivot to recliner with ModAx2, heavy assist given for RW management and and Mod cues for sequencing/safety. Very poor proprioception L LE, but slightly improved with weight of shoes vs socks (used yesterday) Ambulation/Gait General Gait Details: unable- Freight forwarder mobility:  (motorized w/c charging - showing some charge on battery indicator however would not operate - plugged back  in to charge further.)  ADL: ADL Overall ADL's : Needs assistance/impaired Grooming: Wash/dry hands, Wash/dry face, Bed level Upper Body Bathing: Sitting, Minimal assistance, Set up Upper Body Bathing Details (indicate cue type and reason): Pt performed seated UB bathing with setup of washcloth and Min Assist for thoroughness in skin folds. Lower Body Bathing: Total assistance, Sitting/lateral leans, Sit to/from stand, +2 for physical assistance Lower Body Bathing Details (indicate cue type and reason): Pt able to wash the very tops of her upper legs while seated with setup, but ultimately requierd Total assist to clean peri areas, LEs and feet Pt stood for peri cleansing with need of BUE support on RW and physical therapist providing external assistance for pt balance. Pt able to widen her base of support to allow better access for hygiene. Upper Body Dressing : Minimal assistance, Sitting, Set up Upper Body Dressing Details (indicate cue type and reason): Pt donned fresh anterior hospital gown with Min As. Lower Body Dressing: Total assistance, Sitting/lateral leans Lower Body Dressing Details (indicate cue type and reason): Total Assit to doff and don socks. Toilet Transfer: +2 for physical assistance, Moderate assistance, RW Toilet Transfer Details (indicate cue type and reason): Pt pivoted to recliner with use of RW and Moderate assist of 2 people. Toileting - Clothing Manipulation Details (indicate cue type and reason): On pure wick. Total Assist for peri hygiene during bath. Functional mobility during ADLs: Moderate assistance, +2 for physical assistance, Rolling walker, Cueing for sequencing, Cueing for safety  Cognition: Cognition Overall Cognitive Status: Within Functional Limits for tasks assessed Orientation Level: Oriented X4 Cognition Arousal/Alertness: Awake/alert Behavior During Therapy: WFL for tasks assessed/performed Overall Cognitive Status: Within Functional Limits for  tasks assessed General Comments: very pleasant and cooperative with therapy, motivated to improve  Physical Exam: Blood pressure (!) 112/46, pulse 60, temperature 98.2 F (36.8 C), temperature source Oral, resp. rate 20, height '5\' 7"'$  (1.702 m), weight 121.9 kg, SpO2 91 %. Physical Exam Vitals reviewed.  Constitutional:      Appearance: She is obese.  HENT:     Head: Normocephalic and atraumatic.     Right Ear: External ear normal.     Left Ear: External ear normal.     Nose: Nose normal.  Eyes:     General:  Right eye: No discharge.        Left eye: No discharge.     Extraocular Movements: Extraocular movements intact.  Cardiovascular:     Rate and Rhythm: Normal rate and regular rhythm.  Pulmonary:     Effort: Pulmonary effort is normal. No respiratory distress.     Breath sounds: No stridor.  Abdominal:     General: Abdomen is flat. There is no distension.  Musculoskeletal:     Cervical back: Normal range of motion and neck supple.     Comments: Lower extremity edema  Skin:    General: Skin is warm and dry.  Neurological:     Mental Status: She is alert.     Comments: Alert Makes eye contact with examiner.   Follows simple verbal commands.   Mild dysarthria Motor: Grossly 4+/5 throughout (left Sensation diminished to light touch left side No ataxia noted bilateral upper extremities  Psychiatric:        Mood and Affect: Mood normal.        Behavior: Behavior normal.    Results for orders placed or performed during the hospital encounter of 08/04/21 (from the past 48 hour(s))  Glucose, capillary     Status: Abnormal   Collection Time: 08/08/21  7:26 AM  Result Value Ref Range   Glucose-Capillary 189 (H) 70 - 99 mg/dL    Comment: Glucose reference range applies only to samples taken after fasting for at least 8 hours.  Glucose, capillary     Status: Abnormal   Collection Time: 08/08/21 12:48 PM  Result Value Ref Range   Glucose-Capillary 261 (H) 70 - 99  mg/dL    Comment: Glucose reference range applies only to samples taken after fasting for at least 8 hours.  Glucose, capillary     Status: Abnormal   Collection Time: 08/08/21  5:03 PM  Result Value Ref Range   Glucose-Capillary 357 (H) 70 - 99 mg/dL    Comment: Glucose reference range applies only to samples taken after fasting for at least 8 hours.  Glucose, capillary     Status: Abnormal   Collection Time: 08/08/21  5:50 PM  Result Value Ref Range   Glucose-Capillary 305 (H) 70 - 99 mg/dL    Comment: Glucose reference range applies only to samples taken after fasting for at least 8 hours.  Glucose, capillary     Status: Abnormal   Collection Time: 08/08/21  8:56 PM  Result Value Ref Range   Glucose-Capillary 212 (H) 70 - 99 mg/dL    Comment: Glucose reference range applies only to samples taken after fasting for at least 8 hours.  Glucose, capillary     Status: None   Collection Time: 08/09/21  2:37 AM  Result Value Ref Range   Glucose-Capillary 95 70 - 99 mg/dL    Comment: Glucose reference range applies only to samples taken after fasting for at least 8 hours.  Basic metabolic panel     Status: Abnormal   Collection Time: 08/09/21  3:08 AM  Result Value Ref Range   Sodium 137 135 - 145 mmol/L   Potassium 3.8 3.5 - 5.1 mmol/L   Chloride 103 98 - 111 mmol/L   CO2 25 22 - 32 mmol/L   Glucose, Bld 117 (H) 70 - 99 mg/dL    Comment: Glucose reference range applies only to samples taken after fasting for at least 8 hours.   BUN 14 8 - 23 mg/dL   Creatinine, Ser 1.10 (H) 0.44 -  1.00 mg/dL   Calcium 8.9 8.9 - 10.3 mg/dL   GFR, Estimated 54 (L) >60 mL/min    Comment: (NOTE) Calculated using the CKD-EPI Creatinine Equation (2021)    Anion gap 9 5 - 15    Comment: Performed at [facility], [place] [address] [place], [place] [address]  Glucose, capillary     Status: Abnormal   Collection Time: 08/09/21  7:19 AM  Result Value Ref Range   Glucose-Capillary 145 (H) 70 - 99 mg/dL     Comment: Glucose reference range applies only to samples taken after fasting for at least 8 hours.  Glucose, capillary     Status: Abnormal   Collection Time: 08/09/21 11:27 AM  Result Value Ref Range   Glucose-Capillary 259 (H) 70 - 99 mg/dL    Comment: Glucose reference range applies only to samples taken after fasting for at least 8 hours.  Glucose, capillary     Status: Abnormal   Collection Time: 08/09/21  5:00 PM  Result Value Ref Range   Glucose-Capillary 262 (H) 70 - 99 mg/dL    Comment: Glucose reference range applies only to samples taken after fasting for at least 8 hours.  Glucose, capillary     Status: Abnormal   Collection Time: 08/09/21  8:23 PM  Result Value Ref Range   Glucose-Capillary 171 (H) 70 - 99 mg/dL    Comment: Glucose reference range applies only to samples taken after fasting for at least 8 hours.  Glucose, capillary     Status: Abnormal   Collection Time: 08/10/21  3:36 AM  Result Value Ref Range   Glucose-Capillary 67 (L) 70 - 99 mg/dL    Comment: Glucose reference range applies only to samples taken after fasting for at least 8 hours.  Glucose, capillary     Status: Abnormal   Collection Time: 08/10/21  4:19 AM  Result Value Ref Range   Glucose-Capillary 116 (H) 70 - 99 mg/dL    Comment: Glucose reference range applies only to samples taken after fasting for at least 8 hours.   No results found.     Medical Problem List and Plan: 1.  Left-sided weakness and dysarthria secondary to left pontine infarction as well as history of remote lacunar infarcts  -patient may shower  -ELOS/Goals: 17-20 days/Supervision/Min A  Admit to CIR 2.  Antithrombotics: -DVT/anticoagulation: Eliquis Mechanical: Sequential compression devices, below knee Bilateral lower extremities Pharmaceutical: Lovenox  -antiplatelet therapy: N/A 3. Pain Management: Neurontin 100 mg 3 times daily  Monitor with increased exertion 4. Mood: Prozac 30 mg daily  -antipsychotic  agents: N/A 5. Neuropsych: This patient is capable of making decisions on her own behalf. 6. Skin/Wound Care/W OC follow-up: Routine skin checks.  Left heel and right shin wounds 7. Fluids/Electrolytes/Nutrition: Routine in and outs  CMP ordered for tomorrow. 8.  Hypertension.  Norvasc 10 mg daily.    Monitor with increased mobility 9.  History of atrial fibrillation status post pacemaker.  Cardiac rate controlled.  Continue Eliquis 10.  Diabetes mellitus with peripheral neuropathy.  Patient does have an insulin pump that is being utilized  Monitor with increased mobility 11.  Diastolic congestive heart failure.  Demadex 10 mg daily.  Monitor for any signs of fluid overload Filed Weights   08/08/21 0611 08/09/21 0604 08/10/21 0500  Weight: 123.4 kg 121.7 kg 121.9 kg   12.  Hyperlipidemia.  Lovaza/Lipitor 13.  Hypothyroidism.  Synthroid 14.  GERD.  Protonix 15.  Morbid obesity.  BMI  42.02.  Dietary follow-up.  Encourage weight loss 17.  OSA.  BiPAP  Lavon Paganini Angiulli, PA-C 08/10/2021  I have personally performed a face to face diagnostic evaluation, including, but not limited to relevant history and physical exam findings, of this patient and developed relevant assessment and plan.  Additionally, I have reviewed and concur with the physician assistant's documentation above.  Delice Lesch, MD, ABPMR

## 2021-08-09 NOTE — Progress Notes (Signed)
Inpatient Rehabilitation Admissions Coordinator   I met at bedside with patient to inform her that we have received insurance approval for CIR admit, but await bed availability.  Danne Baxter, RN, MSN Rehab Admissions Coordinator [phone] 08/09/2021 1:46 PM

## 2021-08-10 ENCOUNTER — Other Ambulatory Visit: Payer: Self-pay

## 2021-08-10 ENCOUNTER — Encounter (HOSPITAL_COMMUNITY): Payer: Self-pay | Admitting: Physical Medicine & Rehabilitation

## 2021-08-10 ENCOUNTER — Inpatient Hospital Stay (HOSPITAL_COMMUNITY)
Admission: RE | Admit: 2021-08-10 | Discharge: 2021-08-30 | DRG: 057 | Disposition: A | Discharge status: Home Health | Payer: Medicare PPO | Source: Intra-hospital | Attending: Physical Medicine & Rehabilitation | Admitting: Physical Medicine & Rehabilitation

## 2021-08-10 DIAGNOSIS — Z8249 Family history of ischemic heart disease and other diseases of the circulatory system: Secondary | ICD-10-CM | POA: Diagnosis not present

## 2021-08-10 DIAGNOSIS — Z6841 Body Mass Index (BMI) 40.0 and over, adult: Secondary | ICD-10-CM

## 2021-08-10 DIAGNOSIS — I442 Atrioventricular block, complete: Secondary | ICD-10-CM | POA: Diagnosis present

## 2021-08-10 DIAGNOSIS — G4733 Obstructive sleep apnea (adult) (pediatric): Secondary | ICD-10-CM | POA: Diagnosis present

## 2021-08-10 DIAGNOSIS — E785 Hyperlipidemia, unspecified: Secondary | ICD-10-CM | POA: Diagnosis present

## 2021-08-10 DIAGNOSIS — Z794 Long term (current) use of insulin: Secondary | ICD-10-CM | POA: Diagnosis not present

## 2021-08-10 DIAGNOSIS — I5032 Chronic diastolic (congestive) heart failure: Secondary | ICD-10-CM | POA: Diagnosis present

## 2021-08-10 DIAGNOSIS — E1142 Type 2 diabetes mellitus with diabetic polyneuropathy: Secondary | ICD-10-CM | POA: Diagnosis not present

## 2021-08-10 DIAGNOSIS — Z85850 Personal history of malignant neoplasm of thyroid: Secondary | ICD-10-CM | POA: Diagnosis not present

## 2021-08-10 DIAGNOSIS — D72819 Decreased white blood cell count, unspecified: Secondary | ICD-10-CM

## 2021-08-10 DIAGNOSIS — I480 Paroxysmal atrial fibrillation: Secondary | ICD-10-CM | POA: Diagnosis present

## 2021-08-10 DIAGNOSIS — L89620 Pressure ulcer of left heel, unstageable: Secondary | ICD-10-CM | POA: Diagnosis present

## 2021-08-10 DIAGNOSIS — Z9641 Presence of insulin pump (external) (internal): Secondary | ICD-10-CM | POA: Diagnosis present

## 2021-08-10 DIAGNOSIS — I639 Cerebral infarction, unspecified: Secondary | ICD-10-CM

## 2021-08-10 DIAGNOSIS — I10 Essential (primary) hypertension: Secondary | ICD-10-CM

## 2021-08-10 DIAGNOSIS — I69354 Hemiplegia and hemiparesis following cerebral infarction affecting left non-dominant side: Secondary | ICD-10-CM | POA: Diagnosis not present

## 2021-08-10 DIAGNOSIS — E890 Postprocedural hypothyroidism: Secondary | ICD-10-CM | POA: Diagnosis present

## 2021-08-10 DIAGNOSIS — E669 Obesity, unspecified: Secondary | ICD-10-CM | POA: Diagnosis not present

## 2021-08-10 DIAGNOSIS — Z79899 Other long term (current) drug therapy: Secondary | ICD-10-CM

## 2021-08-10 DIAGNOSIS — Z950 Presence of cardiac pacemaker: Secondary | ICD-10-CM | POA: Diagnosis not present

## 2021-08-10 DIAGNOSIS — E1042 Type 1 diabetes mellitus with diabetic polyneuropathy: Secondary | ICD-10-CM | POA: Diagnosis not present

## 2021-08-10 DIAGNOSIS — Z91040 Latex allergy status: Secondary | ICD-10-CM

## 2021-08-10 DIAGNOSIS — Z823 Family history of stroke: Secondary | ICD-10-CM | POA: Diagnosis not present

## 2021-08-10 DIAGNOSIS — R531 Weakness: Secondary | ICD-10-CM | POA: Diagnosis not present

## 2021-08-10 DIAGNOSIS — I110 Hypertensive heart disease with heart failure: Secondary | ICD-10-CM | POA: Diagnosis not present

## 2021-08-10 DIAGNOSIS — K219 Gastro-esophageal reflux disease without esophagitis: Secondary | ICD-10-CM | POA: Diagnosis present

## 2021-08-10 DIAGNOSIS — Z833 Family history of diabetes mellitus: Secondary | ICD-10-CM

## 2021-08-10 DIAGNOSIS — Z841 Family history of disorders of kidney and ureter: Secondary | ICD-10-CM | POA: Diagnosis not present

## 2021-08-10 DIAGNOSIS — Z83438 Family history of other disorder of lipoprotein metabolism and other lipidemia: Secondary | ICD-10-CM

## 2021-08-10 DIAGNOSIS — Z7901 Long term (current) use of anticoagulants: Secondary | ICD-10-CM

## 2021-08-10 DIAGNOSIS — I69322 Dysarthria following cerebral infarction: Secondary | ICD-10-CM | POA: Diagnosis not present

## 2021-08-10 DIAGNOSIS — E1159 Type 2 diabetes mellitus with other circulatory complications: Secondary | ICD-10-CM | POA: Diagnosis not present

## 2021-08-10 DIAGNOSIS — Z793 Long term (current) use of hormonal contraceptives: Secondary | ICD-10-CM

## 2021-08-10 DIAGNOSIS — Z809 Family history of malignant neoplasm, unspecified: Secondary | ICD-10-CM

## 2021-08-10 DIAGNOSIS — E1169 Type 2 diabetes mellitus with other specified complication: Secondary | ICD-10-CM | POA: Diagnosis not present

## 2021-08-10 HISTORY — DX: Cerebral infarction, unspecified: I63.9

## 2021-08-10 LAB — GLUCOSE, CAPILLARY
Glucose-Capillary: 116 mg/dL — ABNORMAL HIGH (ref 70–99)
Glucose-Capillary: 67 mg/dL — ABNORMAL LOW (ref 70–99)
Glucose-Capillary: 156 mg/dL — ABNORMAL HIGH (ref 70–99)
Glucose-Capillary: 178 mg/dL — ABNORMAL HIGH (ref 70–99)
Glucose-Capillary: 184 mg/dL — ABNORMAL HIGH (ref 70–99)

## 2021-08-10 MED ORDER — AMLODIPINE BESYLATE 10 MG PO TABS
10.0000 mg | ORAL_TABLET | Freq: Every day | ORAL | Status: DC
  Administered 2021-08-11 – 2021-08-30 (×20): 10 mg via ORAL
  Filled 2021-08-10 (×20): qty 1

## 2021-08-10 MED ORDER — POLYETHYLENE GLYCOL 3350 17 G PO PACK: 17.0000 g | PACK | Freq: Every day | ORAL | 0 refills | Status: DC

## 2021-08-10 MED ORDER — LIVING WELL WITH DIABETES BOOK
Freq: Once | Status: AC
  Filled 2021-08-10: qty 1

## 2021-08-10 MED ORDER — POLYETHYLENE GLYCOL 3350 17 G PO PACK
17.0000 g | PACK | Freq: Every day | ORAL | Status: DC
  Administered 2021-08-11 – 2021-08-30 (×19): 17 g via ORAL
  Filled 2021-08-10 (×20): qty 1

## 2021-08-10 MED ORDER — OMEGA-3-ACID ETHYL ESTERS 1 G PO CAPS
1.0000 g | ORAL_CAPSULE | Freq: Every day | ORAL | Status: DC
  Administered 2021-08-11 – 2021-08-30 (×20): 1 g via ORAL
  Filled 2021-08-10 (×20): qty 1

## 2021-08-10 MED ORDER — BLOOD PRESSURE CONTROL BOOK
Freq: Once | Status: AC
  Filled 2021-08-10: qty 1

## 2021-08-10 MED ORDER — PANTOPRAZOLE SODIUM 40 MG PO TBEC
40.0000 mg | DELAYED_RELEASE_TABLET | Freq: Two times a day (BID) | ORAL | Status: DC
  Administered 2021-08-10 – 2021-08-30 (×39): 40 mg via ORAL
  Filled 2021-08-10 (×40): qty 1

## 2021-08-10 MED ORDER — LORATADINE 10 MG PO TABS
10.0000 mg | ORAL_TABLET | Freq: Every day | ORAL | Status: DC
  Administered 2021-08-11 – 2021-08-30 (×20): 10 mg via ORAL
  Filled 2021-08-10 (×20): qty 1

## 2021-08-10 MED ORDER — OMEGA-3-ACID ETHYL ESTERS 1 G PO CAPS
1.0000 g | ORAL_CAPSULE | Freq: Every day | ORAL | Status: DC
Start: 2021-08-10 — End: 2021-08-30

## 2021-08-10 MED ORDER — GABAPENTIN 100 MG PO CAPS: 100.0000 mg | ORAL_CAPSULE | Freq: Three times a day (TID) | ORAL | Status: DC

## 2021-08-10 MED ORDER — APIXABAN 5 MG PO TABS
5.0000 mg | ORAL_TABLET | Freq: Two times a day (BID) | ORAL | Status: DC
  Administered 2021-08-10 – 2021-08-30 (×40): 5 mg via ORAL
  Filled 2021-08-10 (×21): qty 1
  Filled 2021-08-10: qty 2
  Filled 2021-08-10 (×18): qty 1

## 2021-08-10 MED ORDER — FLUOXETINE HCL 10 MG PO CAPS: 30.0000 mg | ORAL_CAPSULE | Freq: Every day | ORAL | 3 refills | Status: DC

## 2021-08-10 MED ORDER — LEVOTHYROXINE SODIUM 75 MCG PO TABS
175.0000 ug | ORAL_TABLET | Freq: Every day | ORAL | Status: DC
  Administered 2021-08-11 – 2021-08-30 (×20): 175 ug via ORAL
  Filled 2021-08-10 (×21): qty 1

## 2021-08-10 MED ORDER — POTASSIUM CHLORIDE CRYS ER 10 MEQ PO TBCR
10.0000 meq | EXTENDED_RELEASE_TABLET | Freq: Every day | ORAL | Status: DC
  Administered 2021-08-11 – 2021-08-30 (×20): 10 meq via ORAL
  Filled 2021-08-10 (×20): qty 1

## 2021-08-10 MED ORDER — GABAPENTIN 100 MG PO CAPS
100.0000 mg | ORAL_CAPSULE | Freq: Three times a day (TID) | ORAL | Status: DC
  Administered 2021-08-10 – 2021-08-30 (×58): 100 mg via ORAL
  Filled 2021-08-10 (×59): qty 1

## 2021-08-10 MED ORDER — INSULIN PUMP: 1.0000 | Freq: Three times a day (TID) | SUBCUTANEOUS | Status: DC

## 2021-08-10 MED ORDER — TORSEMIDE 20 MG PO TABS
10.0000 mg | ORAL_TABLET | Freq: Every day | ORAL | Status: DC
  Administered 2021-08-11 – 2021-08-30 (×20): 10 mg via ORAL
  Filled 2021-08-10 (×20): qty 1

## 2021-08-10 MED ORDER — INSULIN PUMP
Freq: Three times a day (TID) | SUBCUTANEOUS | Status: DC
Start: 2021-08-10 — End: 2021-08-30
  Administered 2021-08-11: 4 via SUBCUTANEOUS
  Administered 2021-08-12: 1.1 via SUBCUTANEOUS
  Administered 2021-08-13: 3.5 via SUBCUTANEOUS
  Administered 2021-08-14: 2 via SUBCUTANEOUS
  Administered 2021-08-14: 1.9 via SUBCUTANEOUS
  Administered 2021-08-14: 4.2 via SUBCUTANEOUS
  Administered 2021-08-15: 2.3 via SUBCUTANEOUS
  Administered 2021-08-15: 1 via SUBCUTANEOUS
  Administered 2021-08-15: 1.5 via SUBCUTANEOUS
  Administered 2021-08-16: 0.5 via SUBCUTANEOUS
  Administered 2021-08-17: 0.6 via SUBCUTANEOUS
  Administered 2021-08-18: 1.7 via SUBCUTANEOUS
  Administered 2021-08-20: 3.2 via SUBCUTANEOUS
  Administered 2021-08-27: 1.8 via SUBCUTANEOUS
  Administered 2021-08-28: 1.7 via SUBCUTANEOUS
  Administered 2021-08-28: 1 via SUBCUTANEOUS
  Administered 2021-08-29: 1.8 via SUBCUTANEOUS
  Administered 2021-08-29: 2.2 via SUBCUTANEOUS
  Administered 2021-08-29: 1.6 via SUBCUTANEOUS
  Administered 2021-08-30: 0.4 via SUBCUTANEOUS
  Administered 2021-08-30: 1.2 via SUBCUTANEOUS
  Filled 2021-08-10: qty 1

## 2021-08-10 MED ORDER — PROSIGHT PO TABS
1.0000 | ORAL_TABLET | Freq: Every day | ORAL | Status: DC
  Administered 2021-08-11 – 2021-08-30 (×20): 1 via ORAL
  Filled 2021-08-10 (×20): qty 1

## 2021-08-10 MED ORDER — SENNA 8.6 MG PO TABS
1.0000 | ORAL_TABLET | Freq: Every day | ORAL | Status: DC
  Administered 2021-08-11 – 2021-08-30 (×20): 8.6 mg via ORAL
  Filled 2021-08-10 (×20): qty 1

## 2021-08-10 MED ORDER — ATORVASTATIN CALCIUM 80 MG PO TABS
80.0000 mg | ORAL_TABLET | Freq: Every evening | ORAL | Status: DC
  Administered 2021-08-10 – 2021-08-29 (×20): 80 mg via ORAL
  Filled 2021-08-10 (×19): qty 1

## 2021-08-10 MED ORDER — FLUOXETINE HCL 20 MG PO CAPS
30.0000 mg | ORAL_CAPSULE | Freq: Every day | ORAL | Status: DC
  Administered 2021-08-11 – 2021-08-30 (×20): 30 mg via ORAL
  Filled 2021-08-10 (×20): qty 1

## 2021-08-10 NOTE — Progress Notes (Addendum)
Inpatient Rehabilitation Medication Review by a Pharmacist  A complete drug regimen review was completed for this patient to identify any potential clinically significant medication issues.  High Risk Drug Classes Is patient taking? Indication by Medication  Antipsychotic No   Anticoagulant No   Antibiotic No   Opioid No   Antiplatelet No   Hypoglycemics/insulin Yes Per insulin pump  Vasoactive Medication Yes hypertension  Chemotherapy No   Other No      Type of Medication Issue Identified Description of Issue Recommendation(s)  Drug Interaction(s) (clinically significant)     Duplicate Therapy     Allergy     No Medication Administration End Date     Incorrect Dose     Additional Drug Therapy Needed     Significant med changes from prior encounter (inform family/care partners about these prior to discharge). Stop taking: baclofen, cranberry extract, loperamide, losartan.  These were taken PTA   Other  Discharge summary noted to add include Tylenol but it was not ordered for rehab stay.     Clinically significant medication issues were identified that warrant physician communication and completion of prescribed/recommended actions by midnight of the next day:  Yes  Name of provider notified for urgent issues identified:   Provider Method of Notification:     Pharmacist comments:   Time spent performing this drug regimen review (minutes):  25 mins   [name] 08/10/2021 9:58 PM

## 2021-08-10 NOTE — Progress Notes (Signed)
Family Medicine Teaching Service Daily Progress Note Intern Pager: [phone]  Patient name: [name] Medical record number: [id number] Date of birth: 1951/05/25 Age: 70 y.o. Gender: female  Primary Care Provider: Eulis Foster, MD Consultants: Neuro (s/o) Code Status: Full  Pt Overview and Major Events to Date:  8/11- admitted 8/12- MRI showing new acute infarct of L pons  Assessment and Plan:  [name] is a 70 yo female who presented with weakness and transient dysarthria, found to have acute L pontine infarct. PMH significant for T1DM, prior CVA with residual L sided deficits, A Fib on Eliquis, hx complete heart block s/p pacemaker, HTN, HLD, obesity, OSA, HFpEF, GERD, hx thyroid CA s/p total thyroidectomy. She is stable for discharge to CIR when a bed comes available.   Acute left pontine CVA Stable today and doing well. Residual left sided weakness from previous CVA.  - Stable for CIR -PT/OT has seen -On Eliquis '5mg'$  2xdaily, lipitor '80mg'$  daily  OSA  HFpEF  HTN BiPaP used at home, EF-60-65%, Bps: 112/46, 124/70 -Torsemide '10mg'$  daily -Amlodipine '10mg'$  daily  -Home losartan has been held this admission   HLD Patient takes fish oil at home, home dose is 800 mg daily. -Continue Lipitor -Will add on omega-3 fish oil 1000 mg   Constipation, resolved - Continue MiraLAX and Senna   T1DM Glucose 171>67>116. - Continue Insulin pump - Diabetes coordinator following   A Fib Hx of complete AV block s/p pacemaker Rate controlled. - Continue Eliquis   Pressure ulcer L Heel - Continue Prevalon boots   Depression, chronic, stable - Continue Prozac '30mg'$  daily   S/p total thyroidectomy - Continue levothyroxine   GERD, chronic, stable - Continue Protonix '40mg'$  BID  FEN/GI: Heart healthy PPx: Eliquis Dispo:CIR  when bed becomes available . Barriers include placement.   Subjective:  Pt is doing well today and has no complaints. She  understands the plan to wait for placement.   Objective: Temp:  [98.2 F (36.8 C)-98.8 F (37.1 C)] 98.5 F (36.9 C) (08/17 0802) Pulse Rate:  [60-73] 72 (08/17 0802) Resp:  [18-21] 21 (08/17 0802) BP: (108-145)/(46-72) 145/58 (08/17 0802) SpO2:  [91 %-96 %] 92 % (08/17 0802) Weight:  [121.9 kg] 121.9 kg (08/17 0500) Physical Exam: General: NAD, laying in bed Cardiovascular: paced rhythm, no murmurs Respiratory: CTAB, Normal WOB Abdomen: no TTP, PS present, nondistended Extremities: Moving all extremities  Neuro: Alert and oriented x3. Weakness on left side. No slurring with speech today.   Laboratory: Recent Labs  Lab 08/04/21 1647 08/05/21 0440 08/06/21 0102  WBC 4.5 4.6 5.7  HGB 11.3* 10.3* 11.1*  HCT 36.0 33.3* 34.8*  PLT 173 78* 167   Recent Labs  Lab 08/04/21 1647 08/04/21 2303 08/05/21 0440 08/05/21 1238 08/06/21 0102 08/09/21 0308  NA 140  --    < > 139 140 137  K 3.8  --    < > 4.0 3.7 3.8  CL 106  --    < > 107 108 103  CO2 26  --    < > '25 26 25  '$ BUN 20  --    < > 24* 21 14  CREATININE 1.28*  --    < > 1.25* 1.06* 1.10*  CALCIUM 9.0  --    < > 8.9 8.8* 8.9  PROT 6.7 6.0*  --   --   --   --   BILITOT 0.8 0.5  --   --   --   --  ALKPHOS 91 78  --   --   --   --   ALT 24 22  --   --   --   --   AST 27 21  --   --   --   --   GLUCOSE 152*  --    < > 252* 67* 117*   < > = values in this interval not displayed.     Imaging/Diagnostic Tests: No new today  Erskine Emery, MD 08/10/2021, 11:10 AM PGY-1, [place] Intern pager: [phone], text pages welcome

## 2021-08-10 NOTE — Progress Notes (Signed)
Spoke with patient on the phone this morning to let her know that she can call [name] at her office if she feels that her insulin pump settings need to be changed. Patient states that she can make changes herself when talking with [name].  Patient also is requesting to have a hospital bed at her home when discharged. Ricki Miller, case manager, was notified by me with this patient's request. She said that she would make arrangements when the time comes.   Harvel Ricks RN BSN CDE Diabetes Coordinator Pager: [phone]  8am-5pm

## 2021-08-10 NOTE — Discharge Summary (Addendum)
[facility] Discharge Summary  Patient name: [name] Medical record number: [id number] Date of birth: 1951-07-02 Age: 70 y.o. Gender: female Date of Admission: 08/04/2021  Date of Discharge: 08/10/21 Admitting Physician: [name] Brill, DO  Primary Care Provider: Eulis Foster, MD Consultants: Neuro  Indication for Hospitalization: Onset of left-sided weakness   Discharge Diagnoses/Problem List:  Principal Problem:   Left pontine stroke [facility]) Active Problems:   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 ([place])   Morbid obesity ([place])   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 ([place])   Hypertension associated with diabetes ([place])   Insulin pump in place   OSA (obstructive sleep apnea)   Paroxysmal atrial fibrillation ([place])   Dependent for wheelchair mobility   Hemiparesis and numbness affecting left side as late effect of cerebrovascular accident (CVA) ([place])   Mild cognitive impairment   Unable to perform bed mobility without assistance   Weakness of left side of body   BMI 40.0-44.9, adult ([place])   Dyspnea   Weakness   Chronic diastolic congestive heart failure ([place])   Disposition: CIR  Discharge Condition: Stable  Discharge Exam:  [name] 127/71, pulse 66, temperature 97.7 F (36.5 C), temperature source Oral, resp. rate 16, height '5\' 7"'$  (1.702 m), weight 268 lb 11.9 oz (121.9 kg), SpO2 100 %. Physical Exam: General: Comfortable resting in bed, NAD Cardiovascular: No murmur found, paced rhythm Respiratory: NOB, CTAB Abdomen: nontender to palpation, normal BS Extremities: Prevalon boots in place Neuro: Mild slurring when starting speech, no facial asymmetry, R sided weaker than L on exam today     Brief Hospital Course:  [name] is a 70 y.o. female who presented with left-sided weakness.  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 PMH is significant for Type 1 DM, h/o CVA, a fib (on Eliquis) s/p placemaker 06/2020, HTN, HLD, morbid obesity, OSA, HFpEF, GERD, thyroid cancer s/p total thyroidectomy.  Acute Left Pontine Infarct Arrived to ED hemodynamically stable with slight slurring of speech, CT head impression revealed no acute intracranial abnormalities or hemorrhage. Remainder of workup was unremarkable for metabolic abnormalities. [name] ranged from 98-160. Labs in ED remarkable for: PT 16.6, PTT 33, INR 1.3, WBC 4.5 Hgb 11.3, Cr 1.38, Glucose 152. TSH 0.209.  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  Stroke workup revealed an echo that was unchanged from previous echo in March of 2021 with LVEF 60-65%, mild LVH, and indeterminate diastolic function.  Carotid dopplers showed 1-39% stenosis bilaterally.  She was evaluated by PT and OT who felt that she would benefit from CIR for intensive rehabilitation. CIR agreed to admit her on 8/17. Stable for d/c.    Dyspnea  OSA On arrival she was complaining of dyspnea on exertion. CXR showed cardiomegaly and central vascular prominence but no overt pulmonary edema. She was also noted to have 1+ pitting edema bilaterally. Her [name] on admission were soft (90s/50s, MAP in the low 60s) and we therefore held her home torsemide. Orthostatic vital signs were positive for an increase in HR >30bpm, though this result may have been complicated by her A Fib. There was no decrease in  her BP upon standing. She desatted to 89% on RA with exertion. By hospital day 3, her BP had increased to [id number] systolic and her home torsemide was restarted.  Her [name] remained stable  throughout the remainder of the hospitalization.   L Heel Ulcer She was found to have an unstageable ulcer of the left heel. Wound care was consulted and recommended use of bilateral Prevalon boots to prevent further pressure injury.   Items for PCP Follow-up 1) We decreased her gabapentin dose and held her baclofen while in hospital. Please revisit with her whether these are still needed  2) Follow up the wound on her L heel. She is at high risk for developing pressure ulcers elsewhere. Regular skin exams are recommended.  3) We have the pt on amlodipine and torsemide, did not continue her losartan. Follow up [name].   Significant Procedures: No procedures performed   Significant Labs and Imaging:  Recent Labs  Lab 08/04/21 1647 08/05/21 0440 08/06/21 0102  WBC 4.5 4.6 5.7  HGB 11.3* 10.3* 11.1*  HCT 36.0 33.3* 34.8*  PLT 173 78* 167   Recent Labs  Lab 08/04/21 1647 08/04/21 2303 08/05/21 0440 08/05/21 0557 08/05/21 1238 08/06/21 0102 08/09/21 0308  NA 140  --  137  --  139 140 137  K 3.8  --  5.8*   < > 4.0 3.7 3.8  CL 106  --  105  --  107 108 103  CO2 26  --  20*  --  '25 26 25  '$ GLUCOSE 152*  --  302*  --  252* 67* 117*  BUN 20  --  21  --  24* 21 14  CREATININE 1.28*  --  1.08*  --  1.25* 1.06* 1.10*  CALCIUM 9.0  --  8.6*  --  8.9 8.8* 8.9  MG  --  1.8  --   --   --   --   --   ALKPHOS 91 78  --   --   --   --   --   AST 27 21  --   --   --   --   --   ALT 24 22  --   --   --   --   --   ALBUMIN 3.2* 2.8*  --   --   --   --   --    < > = values in this interval not displayed.    Results/Tests Pending at Time of Discharge: None   Discharge Medications:  Allergies as of 08/10/2021       Reactions   Latex Rash        Medication List     STOP taking these medications    baclofen 10 MG tablet Commonly known as: LIORESAL   CRANBERRY EXTRACT PO   insulin aspart 100 UNIT/ML injection Commonly known as: NovoLOG   loperamide 2 MG  tablet Commonly known as: IMODIUM A-D   losartan 100 MG tablet Commonly known as: COZAAR       TAKE these medications    Accu-Chek Guide test strip Generic drug: glucose [name]   acetaminophen 500 MG tablet Commonly known as: TYLENOL Take 1,000 mg by mouth every 8 (eight) hours as needed for mild pain.   amLODipine 10 MG tablet Commonly known as: NORVASC Take 1 tablet (10 mg total) by mouth daily.   apixaban 5 MG Tabs tablet Commonly known as: Eliquis Take 1 tablet (5 mg total)  by mouth 2 (two) times daily.   atorvastatin 80 MG tablet Commonly known as: LIPITOR Take 1 tablet (80 mg total) by mouth every evening.   Dexcom G6 Transmitter Misc by Does not apply route. Checks her [name] sugar   fexofenadine 180 MG tablet Commonly known as: ALLEGRA Take 180 mg by mouth daily as needed for allergies or rhinitis.   FLUoxetine 10 MG capsule Commonly known as: PROZAC Take 3 capsules (30 mg total) by mouth daily. What changed:  See the new instructions. Another medication with the same name was removed. Continue taking this medication, and follow the directions you see here.   gabapentin 100 MG capsule Commonly known as: NEURONTIN Take 1 capsule (100 mg total) by mouth 3 (three) times daily. What changed:  medication strength See the new instructions.   insulin pump Soln Inject 1 each into the skin 3 times daily with meals, bedtime and 2 AM. What changed:  when to take this additional instructions   levothyroxine 175 MCG tablet Commonly known as: SYNTHROID Take 1 tablet (175 mcg total) by mouth daily before breakfast.   OCUVITE PO Take 1 tablet by mouth daily.   omega-3 acid ethyl esters 1 g capsule Commonly known as: LOVAZA Take 1 capsule (1 g total) by mouth daily.   pantoprazole 40 MG tablet Commonly known as: PROTONIX Take 1 tablet (40 mg total) by mouth 2 (two) times daily.   polyethylene glycol 17 g packet Commonly known as: MIRALAX / GLYCOLAX Take  17 g by mouth daily.   potassium chloride 10 MEQ tablet Commonly known as: KLOR-CON Take 1 tablet (10 mEq total) by mouth daily.   torsemide 10 MG tablet Commonly known as: DEMADEX TAKE 1 TABLET(10 MG) BY MOUTH DAILY AS NEEDED What changed: See the new instructions.        Discharge Instructions: Please refer to Patient Instructions section of EMR for full details.  Patient was counseled important signs and symptoms that should prompt return to medical care, changes in medications, dietary instructions, activity restrictions, and follow up appointments.   Follow-Up Appointments:  Instructed pt to call and schedule appointment after discharge  Erskine Emery, MD 08/10/2021, 1:55 PM PGY-1, [place] Upper-Level Resident Addendum   I have independently interviewed and examined the patient. I have discussed the above with the original author and agree with their documentation. My edits for correction/addition/clarification are included where appropriate. Please see also any attending notes.   Sharion Settler, DO PGY-2, [facility] 08/10/2021 4:12 PM  FPTS Service pager: [phone] (text pages welcome through [place])

## 2021-08-10 NOTE — H&P (Signed)
Physical Medicine and Rehabilitation Admission H&P    Chief Complaint  Patient presents with   Extremity Weakness  : HPI: [name] is a 70 year old right-handed female with history of diabetes mellitus with insulin pump, prior CVA with residual left-sided weakness as well as dysarthria, atrial fibrillation maintained on Eliquis, diastolic congestive heart failure, history of complete heart block status post pacemaker 06/25/2020 per Dr. Curt Bears, OSA with CPAP, hypertension, hyperlipidemia, obesity, thyroid cancer with thyroidectomy and obesity with BMI 42.02.  History taken from chart review and patient. Patient lives with relatives.  Multilevel home bed and bath main level with ramped entrance.  Ambulates short distances with platform Rollator.  Granddaughter assist with set up for sponge bath and basic ADLs.  She presented on 08/04/2021 with increasing left hemiparesis and worsening dysarthria.  CT/MRI showed small acute infarct in the left pons.  No significant edema or mass-effect.  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  Remote pontine microhemorrhage.  Echocardiogram with ejection fraction of 60 to 65%, no wall motion abnormalities.  Admission chemistries unremarkable except glucose 152, creatinine 1.28, BNP 130.6.  Patient did not receive tPA.  Neurology follow-up patient currently remains on Eliquis as prior to admission.  Her insulin pump is being utilized for diabetes mellitus.  WOC follow-up 08/05/2021 for left heel and right shin wounds with skin care as directed.  Tolerating a regular consistency diet.  Therapy evaluations completed due to patient's left-sided hemiparesis and dysarthric speech was admitted for a comprehensive rehab program.  Please see preadmission assessment from earlier today as well.  Review of Systems  Constitutional:  Negative for chills and fever.   HENT:  Negative for hearing loss.   Eyes:  Negative for blurred vision and double vision.  Respiratory:  Negative for cough and shortness of breath.   Cardiovascular:  Positive for palpitations and leg swelling. Negative for chest pain.  Gastrointestinal:  Positive for constipation. Negative for heartburn, nausea and vomiting.       GERD  Genitourinary:  Negative for dysuria, flank pain and hematuria.  Musculoskeletal:  Negative for back pain, joint pain and myalgias.       History of falls with left humeral fracture  Skin:  Negative for rash.  Neurological:  Positive for dizziness, sensory change and weakness.  Psychiatric/Behavioral:  Positive for depression.   All other systems reviewed and are negative. Past Medical History:  Diagnosis Date   Abnormal echocardiogram    Anemia    Arthritis    BPPV (benign paroxysmal positional vertigo), right 06/13/2017   Bradycardia 03/07/2020   Cerebrovascular accident (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 ([place]) 10/29/2015   CHB (complete heart block) ([place]) 03/08/2020   CHF (congestive heart failure) ([place])    Closed fracture of left proximal humerus 07/08/2019   Decreased range of motion of left shoulder 09/03/2020   Depression    Dizzy 03/07/2020   bradycardia   Edentulous 09/03/2020   GERD (gastroesophageal reflux disease)    Hemiparesis affecting left side as late effect of cerebrovascular accident (CVA) ([place]) 03/08/2020   Hemorrhoids    History of Falls with injury 03/08/2020   Fall resulting in left humeral fracture   Hyperlipidemia    Hypertension    Hypothyroidism    Intracranial vascular stenosis    Lacunar infarction [facility])    Brain MRI 07/2021 multiple small remote pontine lacunar infarcts   Near syncope 03/08/2020  Paroxysmal atrial fibrillation ([facility])    Presence of permanent cardiac pacemaker    Proteinuria 03/08/2020   Seasonal allergies    Stroke [facility])    Thyroid cancer ([place])    per pt S/p Total Thyroidectomy  with Radioactive Iodine Therapy   Type 1 diabetes mellitus ([place])    Past Surgical History:  Procedure Laterality Date   ABDOMINAL ANGIOGRAM N/A 05/14/2012   Procedure: ABDOMINAL ANGIOGRAM;  Surgeon: Laverda Page, MD;  Location: [facility] CATH LAB;  Service: Cardiovascular;  Laterality: N/A;   ABDOMINAL HYSTERECTOMY  1995   partial   ANKLE FRACTURE SURGERY Right    CARDIAC CATHETERIZATION     with coronary angiogram   cataract  Bilateral 2018   COLONOSCOPY N/A 09/09/2014   Procedure: COLONOSCOPY;  Surgeon: Gatha Mayer, MD;  Location: [place];  Service: Endoscopy;  Laterality: N/A;   ESOPHAGOGASTRODUODENOSCOPY (EGD) WITH PROPOFOL N/A 12/03/2018   Procedure: ESOPHAGOGASTRODUODENOSCOPY (EGD) WITH PROPOFOL;  Surgeon: Doran Stabler, MD;  Location: [place];  Service: Gastroenterology;  Laterality: N/A;   LEFT AND RIGHT HEART CATHETERIZATION WITH CORONARY ANGIOGRAM N/A 05/14/2012   Procedure: LEFT AND RIGHT HEART CATHETERIZATION WITH CORONARY ANGIOGRAM;  Surgeon: Laverda Page, MD;  Location: [facility] CATH LAB;  Service: Cardiovascular;  Laterality: N/A;   LOOP RECORDER INSERTION N/A 12/23/2018   Procedure: LOOP RECORDER INSERTION;  Surgeon: Constance Haw, MD;  Location: [place] CV LAB;  Service: Cardiovascular;  Laterality: N/A;   LOOP RECORDER REMOVAL N/A 06/25/2020   Procedure: LOOP RECORDER REMOVAL;  Surgeon: Constance Haw, MD;  Location: [place] CV LAB;  Service: Cardiovascular;  Laterality: N/A;   MINI METER   09/08/2014   ELECTRONIC INSULIN PUMP   PACEMAKER IMPLANT N/A 06/25/2020   Procedure: PACEMAKER IMPLANT;  Surgeon: Constance Haw, MD;  Location: [place] CV LAB;  Service: Cardiovascular;  Laterality: N/A;   TEE WITHOUT CARDIOVERSION N/A 10/16/2018   Procedure: TRANSESOPHAGEAL ECHOCARDIOGRAM (TEE);  Surgeon: Buford Dresser, MD;  Location: [place];  Service: Cardiovascular;  Laterality: N/A;   THYROIDECTOMY  2006   Family History   Problem Relation Age of Onset   Heart disease Mother    Hyperlipidemia Mother    Hypertension Mother    Renal Disease Mother        insufficiency   Heart attack Mother        in his 40's   Early death Father        Head Injury at Work   Hyperlipidemia Father    Hypertension Father    Stroke Son    Diabetes Maternal Aunt    Prostate cancer Paternal Uncle    Heart disease Maternal Aunt    Heart failure Maternal Grandfather    Heart disease Maternal Grandfather    Heart attack Maternal Grandfather        Died in his 47's   Breast cancer Neg Hx    Social History:  reports that she has never smoked. She has never used smokeless tobacco. She reports that she does not drink alcohol and does not use drugs. Allergies:  Allergies  Allergen Reactions   Latex Rash   Medications Prior to Admission  Medication Sig Dispense Refill   ACCU-CHEK GUIDE test strip      acetaminophen (TYLENOL) 500 MG tablet Take 1,000 mg by mouth every 8 (eight) hours as needed for mild pain.      amLODipine (NORVASC) 10 MG tablet Take 1 tablet (10 mg total) by mouth daily.  90 tablet 1   apixaban (ELIQUIS) 5 MG TABS tablet Take 1 tablet (5 mg total) by mouth 2 (two) times daily. 60 tablet 9   atorvastatin (LIPITOR) 80 MG tablet Take 1 tablet (80 mg total) by mouth every evening. 90 tablet 2   baclofen (LIORESAL) 10 MG tablet Take 0.5 tablets (5 mg total) by mouth 2 (two) times daily as needed for muscle spasms. 30 each 0   Continuous Blood Gluc Transmit (DEXCOM G6 TRANSMITTER) MISC by Does not apply route. Checks her blood sugar     CRANBERRY EXTRACT PO Take 1 capsule by mouth daily.     fexofenadine (ALLEGRA) 180 MG tablet Take 180 mg by mouth daily as needed for allergies or rhinitis.     FLUoxetine (PROZAC) 10 MG capsule TAKE ONE CAPSULE BY MOUTH EVERY DAY WITH '20MG'$  DOSE (Patient taking differently: Take 10 mg by mouth daily.) 90 capsule 1   FLUoxetine (PROZAC) 20 MG tablet TAKE 1 TABLET BY MOUTH EVERY DAY  ALONG WITH 10 MG FOR A TOTAL DOSE OF 30 MG EVERY DAY (Patient taking differently: Take 20 mg by mouth daily.) 90 tablet 2   gabapentin (NEURONTIN) 300 MG capsule TAKE 1 CAPSULE(300 MG) BY MOUTH THREE TIMES DAILY (Patient taking differently: Take 300 mg by mouth 3 (three) times daily.) 270 capsule 2   insulin aspart (NOVOLOG) 100 UNIT/ML injection Inject 20-30 Units into the skin daily. To be used in patient's insulin pump 10 mL    Insulin Human (INSULIN PUMP) SOLN Inject 1 each into the skin continuous. Novolog insulin 1 each 0   levothyroxine (SYNTHROID) 175 MCG tablet Take 1 tablet (175 mcg total) by mouth daily before breakfast. 90 tablet 1   loperamide (IMODIUM A-D) 2 MG tablet Take 1 tablet (2 mg total) by mouth 4 (four) times daily as needed for diarrhea or loose stools. 30 tablet 2   losartan (COZAAR) 100 MG tablet Take 1 tablet (100 mg total) by mouth daily. 90 tablet 1   Multiple Vitamins-Minerals (OCUVITE PO) Take 1 tablet by mouth daily.     pantoprazole (PROTONIX) 40 MG tablet Take 1 tablet (40 mg total) by mouth 2 (two) times daily. 120 tablet 3   potassium chloride (KLOR-CON) 10 MEQ tablet Take 1 tablet (10 mEq total) by mouth daily. 90 tablet 1   torsemide (DEMADEX) 10 MG tablet TAKE 1 TABLET(10 MG) BY MOUTH DAILY AS NEEDED (Patient taking differently: Take 10 mg by mouth daily.) 90 tablet 0    Drug Regimen Review Drug regimen was reviewed and remains appropriate with no significant issues identified  Home: Home Living Family/patient expects to be discharged to:: Private residence Living Arrangements: Other relatives Available Help at Discharge: Family, Available PRN/intermittently Type of Home: House Home Access: Ramped entrance Home Layout: Two level, Able to live on main level with bedroom/bathroom Alternate Level Stairs-Number of Steps: stays on the main level Bathroom Shower/Tub: Other (comment) (pt reported she takes bird baths) Bathroom Toilet: Handicapped  height Bathroom Accessibility: No Home Equipment: Wheelchair - power, Grab bars - toilet, Environmental consultant - 4 wheels, Wheelchair - manual, Financial controller: [place] Additional Comments: [place]  Lives With: Family (sister, granddaughter)   Functional History: Prior Function Level of Independence: Needs assistance Gait / Transfers Assistance Needed: Short distances with platform rollator, stand pivots ADL's / Homemaking Assistance Needed: Granddaughter assists with setup for sponge bath, shoes and socks, and assists with her back.  Family assists with meds, meals, home management and community mobility  Functional Status:  Mobility: Bed Mobility Overal bed mobility: Needs Assistance Bed Mobility: Rolling Rolling: Min assist Sidelying to sit: Mod assist Supine to sit: Min assist, Mod assist, +2 for physical assistance Sit to sidelying: Max assist General bed mobility comments: able to get most of the way up to EOB with MinA, did need as much as ModA to scoot hips all the way forward to EOB Transfers Overall transfer level: Needs assistance Equipment used: Rolling walker (2 wheeled) Transfers: Sit to/from Stand, Stand Pivot Transfers Sit to Stand: Max assist, +2 physical assistance Stand pivot transfers: Mod assist, +2 physical assistance General transfer comment: able to boost into standing with MaxAX2 from standard height bed but with significant posterior lean- needed heavy cues to bring weight forward and push down on RW instead of pulling it backwards. Able to pivot to recliner with ModAx2, heavy assist given for RW management and and Mod cues for sequencing/safety. Very poor proprioception L LE, but slightly improved with weight of shoes vs socks (used yesterday) Ambulation/Gait General Gait Details: unable- Freight forwarder mobility:  (motorized w/c charging - showing some charge on battery indicator however would not operate - plugged back  in to charge further.)  ADL: ADL Overall ADL's : Needs assistance/impaired Grooming: Wash/dry hands, Wash/dry face, Bed level Upper Body Bathing: Sitting, Minimal assistance, Set up Upper Body Bathing Details (indicate cue type and reason): Pt performed seated UB bathing with setup of washcloth and Min Assist for thoroughness in skin folds. Lower Body Bathing: Total assistance, Sitting/lateral leans, Sit to/from stand, +2 for physical assistance Lower Body Bathing Details (indicate cue type and reason): Pt able to wash the very tops of her upper legs while seated with setup, but ultimately requierd Total assist to clean peri areas, LEs and feet Pt stood for peri cleansing with need of BUE support on RW and physical therapist providing external assistance for pt balance. Pt able to widen her base of support to allow better access for hygiene. Upper Body Dressing : Minimal assistance, Sitting, Set up Upper Body Dressing Details (indicate cue type and reason): Pt donned fresh anterior hospital gown with Min As. Lower Body Dressing: Total assistance, Sitting/lateral leans Lower Body Dressing Details (indicate cue type and reason): Total Assit to doff and don socks. Toilet Transfer: +2 for physical assistance, Moderate assistance, RW Toilet Transfer Details (indicate cue type and reason): Pt pivoted to recliner with use of RW and Moderate assist of 2 people. Toileting - Clothing Manipulation Details (indicate cue type and reason): On pure wick. Total Assist for peri hygiene during bath. Functional mobility during ADLs: Moderate assistance, +2 for physical assistance, Rolling walker, Cueing for sequencing, Cueing for safety  Cognition: Cognition Overall Cognitive Status: Within Functional Limits for tasks assessed Orientation Level: Oriented X4 Cognition Arousal/Alertness: Awake/alert Behavior During Therapy: WFL for tasks assessed/performed Overall Cognitive Status: Within Functional Limits for  tasks assessed General Comments: very pleasant and cooperative with therapy, motivated to improve  Physical Exam: Blood pressure (!) 112/46, pulse 60, temperature 98.2 F (36.8 C), temperature source Oral, resp. rate 20, height '5\' 7"'$  (1.702 m), weight 121.9 kg, SpO2 91 %. Physical Exam Vitals reviewed.  Constitutional:      Appearance: She is obese.  HENT:     Head: Normocephalic and atraumatic.     Right Ear: External ear normal.     Left Ear: External ear normal.     Nose: Nose normal.  Eyes:     General:  Right eye: No discharge.        Left eye: No discharge.     Extraocular Movements: Extraocular movements intact.  Cardiovascular:     Rate and Rhythm: Normal rate and regular rhythm.  Pulmonary:     Effort: Pulmonary effort is normal. No respiratory distress.     Breath sounds: No stridor.  Abdominal:     General: Abdomen is flat. There is no distension.  Musculoskeletal:     Cervical back: Normal range of motion and neck supple.     Comments: Lower extremity edema  Skin:    General: Skin is warm and dry.  Neurological:     Mental Status: She is alert.     Comments: Alert Makes eye contact with examiner.   Follows simple verbal commands.   Mild dysarthria Motor: Grossly 4+/5 throughout (left Sensation diminished to light touch left side No ataxia noted bilateral upper extremities  Psychiatric:        Mood and Affect: Mood normal.        Behavior: Behavior normal.    Results for orders placed or performed during the hospital encounter of 08/04/21 (from the past 48 hour(s))  Glucose, capillary     Status: Abnormal   Collection Time: 08/08/21  7:26 AM  Result Value Ref Range   Glucose-Capillary 189 (H) 70 - 99 mg/dL    Comment: Glucose reference range applies only to samples taken after fasting for at least 8 hours.  Glucose, capillary     Status: Abnormal   Collection Time: 08/08/21 12:48 PM  Result Value Ref Range   Glucose-Capillary 261 (H) 70 - 99  mg/dL    Comment: Glucose reference range applies only to samples taken after fasting for at least 8 hours.  Glucose, capillary     Status: Abnormal   Collection Time: 08/08/21  5:03 PM  Result Value Ref Range   Glucose-Capillary 357 (H) 70 - 99 mg/dL    Comment: Glucose reference range applies only to samples taken after fasting for at least 8 hours.  Glucose, capillary     Status: Abnormal   Collection Time: 08/08/21  5:50 PM  Result Value Ref Range   Glucose-Capillary 305 (H) 70 - 99 mg/dL    Comment: Glucose reference range applies only to samples taken after fasting for at least 8 hours.  Glucose, capillary     Status: Abnormal   Collection Time: 08/08/21  8:56 PM  Result Value Ref Range   Glucose-Capillary 212 (H) 70 - 99 mg/dL    Comment: Glucose reference range applies only to samples taken after fasting for at least 8 hours.  Glucose, capillary     Status: None   Collection Time: 08/09/21  2:37 AM  Result Value Ref Range   Glucose-Capillary 95 70 - 99 mg/dL    Comment: Glucose reference range applies only to samples taken after fasting for at least 8 hours.  Basic metabolic panel     Status: Abnormal   Collection Time: 08/09/21  3:08 AM  Result Value Ref Range   Sodium 137 135 - 145 mmol/L   Potassium 3.8 3.5 - 5.1 mmol/L   Chloride 103 98 - 111 mmol/L   CO2 25 22 - 32 mmol/L   Glucose, Bld 117 (H) 70 - 99 mg/dL    Comment: Glucose reference range applies only to samples taken after fasting for at least 8 hours.   BUN 14 8 - 23 mg/dL   Creatinine, Ser 1.10 (H) 0.44 -  1.00 mg/dL   Calcium 8.9 8.9 - 10.3 mg/dL   GFR, Estimated 54 (L) >60 mL/min    Comment: (NOTE) Calculated using the CKD-EPI Creatinine Equation (2021)    Anion gap 9 5 - 15    Comment: Performed at [facility], [place] [address] [place], [place] [address]  Glucose, capillary     Status: Abnormal   Collection Time: 08/09/21  7:19 AM  Result Value Ref Range   Glucose-Capillary 145 (H) 70 - 99 mg/dL     Comment: Glucose reference range applies only to samples taken after fasting for at least 8 hours.  Glucose, capillary     Status: Abnormal   Collection Time: 08/09/21 11:27 AM  Result Value Ref Range   Glucose-Capillary 259 (H) 70 - 99 mg/dL    Comment: Glucose reference range applies only to samples taken after fasting for at least 8 hours.  Glucose, capillary     Status: Abnormal   Collection Time: 08/09/21  5:00 PM  Result Value Ref Range   Glucose-Capillary 262 (H) 70 - 99 mg/dL    Comment: Glucose reference range applies only to samples taken after fasting for at least 8 hours.  Glucose, capillary     Status: Abnormal   Collection Time: 08/09/21  8:23 PM  Result Value Ref Range   Glucose-Capillary 171 (H) 70 - 99 mg/dL    Comment: Glucose reference range applies only to samples taken after fasting for at least 8 hours.  Glucose, capillary     Status: Abnormal   Collection Time: 08/10/21  3:36 AM  Result Value Ref Range   Glucose-Capillary 67 (L) 70 - 99 mg/dL    Comment: Glucose reference range applies only to samples taken after fasting for at least 8 hours.  Glucose, capillary     Status: Abnormal   Collection Time: 08/10/21  4:19 AM  Result Value Ref Range   Glucose-Capillary 116 (H) 70 - 99 mg/dL    Comment: Glucose reference range applies only to samples taken after fasting for at least 8 hours.   No results found.     Medical Problem List and Plan: 1.  Left-sided weakness and dysarthria secondary to left pontine infarction as well as history of remote lacunar infarcts  -patient may shower  -ELOS/Goals: 17-20 days/Supervision/Min A  Admit to CIR 2.  Antithrombotics: -DVT/anticoagulation: Eliquis Mechanical: Sequential compression devices, below knee Bilateral lower extremities Pharmaceutical: Lovenox  -antiplatelet therapy: N/A 3. Pain Management: Neurontin 100 mg 3 times daily  Monitor with increased exertion 4. Mood: Prozac 30 mg daily  -antipsychotic  agents: N/A 5. Neuropsych: This patient is capable of making decisions on her own behalf. 6. Skin/Wound Care/W OC follow-up: Routine skin checks.  Left heel and right shin wounds 7. Fluids/Electrolytes/Nutrition: Routine in and outs  CMP ordered for tomorrow. 8.  Hypertension.  Norvasc 10 mg daily.    Monitor with increased mobility 9.  History of atrial fibrillation status post pacemaker.  Cardiac rate controlled.  Continue Eliquis 10.  Diabetes mellitus with peripheral neuropathy.  Patient does have an insulin pump that is being utilized  Monitor with increased mobility 11.  Diastolic congestive heart failure.  Demadex 10 mg daily.  Monitor for any signs of fluid overload Filed Weights   08/08/21 0611 08/09/21 0604 08/10/21 0500  Weight: 123.4 kg 121.7 kg 121.9 kg   12.  Hyperlipidemia.  Lovaza/Lipitor 13.  Hypothyroidism.  Synthroid 14.  GERD.  Protonix 15.  Morbid obesity.  BMI  42.02.  Dietary follow-up.  Encourage weight loss 17.  OSA.  BiPAP  Lavon Paganini Angiulli, PA-C 08/10/2021  I have personally performed a face to face diagnostic evaluation, including, but not limited to relevant history and physical exam findings, of this patient and developed relevant assessment and plan.  Additionally, I have reviewed and concur with the physician assistant's documentation above.  Delice Lesch, MD, ABPMR The patient's status has not changed. Any changes from the pre-admission screening or documentation from the acute chart are noted above.   Delice Lesch, MD, ABPMR

## 2021-08-10 NOTE — Progress Notes (Addendum)
Inpatient Rehab Admissions Coordinator:  There is a bed available for pt to admit to CIR today. Dr. Zigmund Daniel is aware and in agreement. Pt, TOC, and NSG aware.  Left message for pt's sister, [name]; awaiting return call.   Addendum: Pt's sister, [name] returned call. Informed her pt is being admitted to CIR today.   Gayland Curry, [place], [place] Admissions Coordinator [phone]

## 2021-08-10 NOTE — Progress Notes (Signed)
Signed                                                                                                                                                                                                                                                                                                                                                                                                                                                                                                                       PMR Admission Coordinator Pre-Admission Assessment   Patient: [name] is an 70 y.o., female MRN: [id number] DOB: Jan 09, 1951 Height: 5' 7"  (170.2 cm) Weight: 123.4 kg   Insurance Information HMO:     PPO: yes     PCP:      IPA:      80/20:      OTHER:  PRIMARY: [employer] CHO      Policy#: [id number]      Subscriber: patient CM Name: Thora Lance      Phone#: [phone] ext. [phone]  Fax#: [phone] Pre-Cert#: [id number] Received approval from Wyona Almas with [employer] on 08/08/21. Pt approval begins 08/09/21.      Employer:  Benefits:  Phone #: n/a-online at [email or web address]     Name:  Eff. Date: 12/26/19-still active     Deduct: does not have      Out of Pocket Max: $4,900 ($995.89 met)    Life Max: NA CIR: $160/day co-pay with a max co-pay of $1,600/admission (10 days)      SNF: 100% coverage days 1-20, $178/day co-pay days 21-100, limited to 100 days/cal year Outpatient: $20/visit co-pay       Home Health: 100% coverage        DME: 80% coverage     Co-Pay: 20% co-insurance Providers: in-network SECONDARY:       Policy#:      Phone#:    Development worker, community:       Phone#:    The Engineer, petroleum" for patients in Inpatient Rehabilitation Facilities with attached "Privacy Act  [place] Records" was provided and verbally reviewed with: Patient   Emergency Contact Information Contact Information       Name Relation Home Work Mobile    [place] Sister [phone]   [phone]    Lee,Candance Niece     [phone]           Current Medical History  Patient Admitting Diagnosis: Left pontine stroke   History of Present Illness: Pt is a 70 year old female with medical hx significant for: prior CVA with residual left sided deficits (2016 and 2019), A-fib, HTN, HLD, GERD, OSA, morbid obesity, Type I DM, s/p pacemaker (06/2020), CHF, HFpEF, h/o thyroid CA s/p total thyroidectomy. Pt presented to hospital on 08/04/21 d/t left sided weakness and transient dysarthria. CT revealed no acute abnormalities and no bleeding. MRI on 08/05/21 revealed acute infarct in left pons. Echo on 08/05/21 showed EF 60-65%. Neurologist assessment revealed subtle right-sided weakness which, per family report, is new. Insulin pump reapplied to allow pt to manage insulin while hospitalized. Therapy evaluations completed and CIR recommended d/t pt's deficits in functional mobility.    Complete NIHSS TOTAL: 1   Patient's medical record from [facility] has been reviewed by the rehabilitation admission coordinator and physician.   Past Medical History      Past Medical History:  Diagnosis Date   Abnormal echocardiogram     Anemia     Arthritis     BPPV (benign paroxysmal positional vertigo), right 06/13/2017   Bradycardia 03/07/2020   Cerebrovascular accident (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 ([place]) 10/29/2015   CHB (complete heart block) ([place]) 03/08/2020   CHF (congestive heart failure) ([place])     Closed fracture of left proximal humerus 07/08/2019   Decreased range of motion of left shoulder 09/03/2020   Depression     Dizzy 03/07/2020    bradycardia   Edentulous 09/03/2020   GERD (gastroesophageal reflux disease)     Hemiparesis affecting left side as  late effect of cerebrovascular accident (CVA) ([place]) 03/08/2020   Hemorrhoids     History of Falls with injury 03/08/2020    Fall resulting in left humeral fracture   Hyperlipidemia     Hypertension     Hypothyroidism     Intracranial vascular stenosis     Lacunar infarction [facility])      Brain MRI 07/2021 multiple small remote pontine lacunar infarcts   Near syncope 03/08/2020   Paroxysmal atrial fibrillation ([place])  Presence of permanent cardiac pacemaker     Proteinuria 03/08/2020   Seasonal allergies     Stroke ([place])     Thyroid cancer (HCC)      per pt S/p Total Thyroidectomy with Radioactive Iodine Therapy   Type 1 diabetes mellitus ([place])        Family History   family history includes Diabetes in her maternal aunt; Early death in her father; Heart attack in her maternal grandfather and mother; Heart disease in her maternal aunt, maternal grandfather, and mother; Heart failure in her maternal grandfather; Hyperlipidemia in her father and mother; Hypertension in her father and mother; Prostate cancer in her paternal uncle; Renal Disease in her mother; Stroke in her son.   Prior Rehab/Hospitalizations Has the patient had prior rehab or hospitalizations prior to admission? No   Has the patient had major surgery during 100 days prior to admission? No               Current Medications   Current Facility-Administered Medications:    apixaban (ELIQUIS) tablet 5 mg, 5 mg, Oral, BID, Carollee Leitz, MD, 5 mg at 08/08/21 0931   atorvastatin (LIPITOR) tablet 80 mg, 80 mg, Oral, QPM, Carollee Leitz, MD, 80 mg at 08/07/21 1655   FLUoxetine (PROZAC) capsule 30 mg, 30 mg, Oral, Daily, Jim Like B, MD, 30 mg at 08/08/21 0931   gabapentin (NEURONTIN) capsule 100 mg, 100 mg, Oral, TID, Carollee Leitz, MD, 100 mg at 08/08/21 0931   insulin pump, , Subcutaneous, TID WC, HS, 0200, Espinoza, Alejandra, DO, Given at 08/08/21 0736   levothyroxine (SYNTHROID) tablet 175 mcg, 175 mcg, Oral, QAC  breakfast, Carollee Leitz, MD, 175 mcg at 08/08/21 0541   omega-3 acid ethyl esters (LOVAZA) capsule 1 g, 1 g, Oral, Daily, Jim Like B, MD, 1 g at 08/08/21 0932   pantoprazole (PROTONIX) EC tablet 40 mg, 40 mg, Oral, BID, Carollee Leitz, MD, 40 mg at 08/08/21 0931   polyethylene glycol (MIRALAX / GLYCOLAX) packet 17 g, 17 g, Oral, Daily, Espinoza, Alejandra, DO, 17 g at 08/08/21 0932   potassium chloride (KLOR-CON) CR tablet 10 mEq, 10 mEq, Oral, Daily, Eppie Gibson, MD   senna (SENOKOT) tablet 8.6 mg, 1 tablet, Oral, Daily, Espinoza, Alejandra, DO, 8.6 mg at 08/08/21 0931   torsemide (DEMADEX) tablet 10 mg, 10 mg, Oral, Daily, Eppie Gibson, MD, 10 mg at 08/08/21 3016   Patients Current Diet:  Diet Order                  Diet heart healthy/carb modified Room service appropriate? Yes; Fluid consistency: Thin  Diet effective now                         Precautions / Restrictions Precautions Precautions: Fall Precaution Comments: watch gait belt placement - insulin pump Rt abdomen Restrictions Weight Bearing Restrictions: No    Has the patient had 2 or more falls or a fall with injury in the past year? Yes   Prior Activity Level Limited Community (1-2x/wk): MD appointments   Prior Functional Level Self Care: Did the patient need help bathing, dressing, using the toilet or eating? Needed some help   Indoor Mobility: Did the patient need assistance with walking from room to room (with or without device)? Independent   Stairs: Did the patient need assistance with internal or external stairs (with or without device)? Dependent   Functional Cognition: Did the patient need help planning  regular tasks such as shopping or remembering to take medications? Independent   Development worker, international aid / Equipment Home Assistive Devices/Equipment: Wheelchair Home Equipment: Wheelchair - power, Grab bars - toilet, Environmental consultant - 4 wheels, Wheelchair - manual, Adaptive equipment   Prior  Device Use: Indicate devices/aids used by the patient prior to current illness, exacerbation or injury? Motorized wheelchair or scooter and Location manager   Overall Cognitive Status: Within Functional Limits for tasks assessed Orientation Level: Oriented X4 General Comments: very pleasant and cooperative with therapy, motivated to improve    Extremity Assessment (includes Sensation/Coordination)   Upper Extremity Assessment: Generalized weakness RUE Deficits / Details: slowed coordination and decreased end range shoulder flexion. RUE Sensation: WNL LUE Deficits / Details: slowed coordination and decreased end range shoulder flexion.  Humeral fracture in the past. LUE Sensation: WNL  Lower Extremity Assessment: Generalized weakness, LLE deficits/detail LLE Deficits / Details: maybe a slight difference in strength between LLE and RLE when compared with MMT - more noticable in weight bearing.     ADLs   Overall ADL's : Needs assistance/impaired Grooming: Wash/dry hands, Wash/dry face, Bed level Upper Body Dressing : Minimal assistance, Bed level Lower Body Dressing: Total assistance, Bed level     Mobility   Overal bed mobility: Needs Assistance Bed Mobility: Rolling Rolling: Min assist Sidelying to sit: Mod assist Supine to sit: Min assist, HOB elevated Sit to sidelying: Max assist General bed mobility comments: MinA to roll to remove bed pain out from under her, then MinA with HOB elevated and use of rail to get to EOB, increased time and effort     Transfers   Overall transfer level: Needs assistance Equipment used: Rolling walker (2 wheeled) Transfers: Sit to/from Stand, [employer] Transfers Sit to Stand: Mod assist, +2 physical assistance, From elevated surface Stand pivot transfers: Mod assist, +2 physical assistance General transfer comment: ModAx2 and cues for hand placement/B feet blocked for multipole stands today; able to stand for about 6  minutes for pericare and orthostatic BPs but very fatigued. Needed max cues for safety/sequencing with pivot to recliner     Ambulation / Gait / Stairs / Wheelchair Mobility   Ambulation/Gait General Gait Details: unable- Freight forwarder mobility:  (motorized w/c charging - showing some charge on battery indicator however would not operate - plugged back in to charge further.)     Posture / Balance Dynamic Sitting Balance Sitting balance - Comments: close S statically Balance Overall balance assessment: Needs assistance Sitting-balance support: No upper extremity supported, Feet supported Sitting balance-Leahy Scale: Fair Sitting balance - Comments: close S statically Postural control: Posterior lean Standing balance support: Bilateral upper extremity supported, During functional activity Standing balance-Leahy Scale: Poor Standing balance comment: reliant on BUE support and external support, tends to lean posteriorly and braces legs on bed to balance     Special needs/care consideration BiPAP (uses at night at times), Skin Wound: pretibial Right blister; Left heel blister/callous, Diabetic management insulin pump, Bowel and bladder incontinence, External urinary catheter, and Designated visitor Adrian Blackwater, sister    Previous Home Environment (from acute therapy documentation) Living Arrangements: Other relatives  Lives With: Family (sister, granddaughter) Available Help at Discharge: Family, Available PRN/intermittently Type of Home: House Home Layout: Two level, Able to live on main level with bedroom/bathroom Alternate Level Stairs-Number of Steps: stays on the main level Home Access: Ramped entrance Bathroom Shower/Tub: Other (comment) (pt reported she takes bird baths) Bathroom Toilet: Handicapped height  Bathroom Accessibility: No Home Care Services: Yes Type of Home Care Services: Home RN, [place] (if known):  [place] Additional Comments: [place]   Discharge Living Setting Plans for Discharge Living Setting: Patient's home Type of Home at Discharge: House Discharge Home Layout: Two level, Able to live on main level with bedroom/bathroom Discharge Home Access: [place] entrance Discharge Bathroom Shower/Tub: Other (comment) (pt reports she takes bird baths) Discharge Bathroom Toilet: Handicapped height Discharge Bathroom Accessibility: No Does the patient have any problems obtaining your medications?: No   Social/Family/Support Systems Anticipated Caregiver: Adrian Blackwater, sister Anticipated Caregiver's Contact Information: [phone] Caregiver Availability: Intermittent Discharge Plan Discussed with Primary Caregiver: Yes Is Caregiver In Agreement with Plan?: Yes Does Caregiver/Family have Issues with Lodging/Transportation while Pt is in Rehab?: No   Goals Patient/Family Goal for Rehab: Mod I-Supervision:PT/OT (specifically [facility] to [facility]) Expected length of stay: 16-21 days Pt/Family Agrees to Admission and willing to participate: Yes Program Orientation Provided & Reviewed with Pt/Caregiver Including Roles  & Responsibilities: Yes   Decrease burden of Care through IP rehab admission: NA   Possible need for SNF placement upon discharge: Not anticipated   Patient Condition: I have reviewed medical records from [facility], spoken with CSW, and patient and family member. I met with patient at the bedside and discussed via phone for inpatient rehabilitation assessment.  Patient will benefit from ongoing PT and OT, can actively participate in 3 hours of therapy a day 5 days of the week, and can make measurable gains during the admission.  Patient will also benefit from the coordinated team approach during an Inpatient Acute Rehabilitation admission.  The patient will receive intensive therapy as well as Rehabilitation physician, nursing, social worker, and care management interventions.   Due to bladder management, safety, disease management, medication administration, pain management, and patient education the patient requires 24 hour a day rehabilitation nursing.  The patient is currently Min A-Max A +2 with mobility and Min A- Total A with basic ADLs.  Discharge setting and therapy post discharge at home with home health is anticipated.  Patient has agreed to participate in the Acute Inpatient Rehabilitation Program and will admit today.   Preadmission Screen Completed By:  Bethel Born, 08/08/2021 12:15 PM ______________________________________________________________________   Discussed status with Dr. Posey Pronto on 08/10/21  at 10:46 AM and received approval for admission today.   Admission Coordinator:  Bethel Born, CCC-SLP, time 10:46 AM/Date 08/10/21     Assessment/Plan: Diagnosis: Left pontine stroke Does the need for close, 24 hr/day Medical supervision in concert with the patient's rehab needs make it unreasonable for this patient to be served in a less intensive setting? Yes Co-Morbidities requiring supervision/potential complications: prior CVA with residual left sided deficits (2016 and 2019), A-fib, HTN (monitor and provide prns in accordance with increased physical exertion and pain), HLD, GERD, OSA, morbid obesity, Type I DM (Monitor in accordance with exercise and adjust meds as necessary), s/p pacemaker (06/2020), CHF, HFpEF, h/o thyroid CA s/p total thyroidectomy Due to bladder management, bowel management, safety, disease management, medication administration, and patient education, does the patient require 24 hr/day rehab nursing? Yes Does the patient require coordinated care of a physician, rehab nurse, PT, OT to address physical and functional deficits in the context of the above medical diagnosis(es)? Yes Addressing deficits in the following areas: balance, endurance, locomotion, strength, transferring, bathing, dressing, toileting, and  psychosocial support Can the patient actively participate in an intensive therapy program of  at least 3 hrs of therapy 5 days a week? Yes The potential for patient to make measurable gains while on inpatient rehab is excellent Anticipated functional outcomes upon discharge from inpatient rehab: supervision and min assist PT, supervision and min assist OT, n/a SLP Estimated rehab length of stay to reach the above functional goals is: 10-14 days. Anticipated discharge destination: Home 10. Overall Rehab/Functional Prognosis: good     MD Signature: Delice Lesch, MD, ABPMR

## 2021-08-10 NOTE — Progress Notes (Signed)
Physical Therapy Treatment Patient Details Name: [name] MRN: [id number] DOB: 1951-08-19 Today's Date: 08/10/2021    History of Present Illness 70 y.o. 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 Imaging confirmed acute pontine stroke.    PT Comments    Patient received in bed, remains pleasant and very motivated to participate in therapy today. Tried getting up on left side of the bed and did need more assist to do so. Challenged her with more focused pre-gait training today in front of bed, including alternating forward toe taps (required ModAx2 and demonstrates poor proprioception L LE with tendency to scissor) and side steps alongside EOB (again required ModAx2 for balance/RW management). HR to 120s with these activities and fatigued. Interestingly, both ankles tends to turn into supination (L much more than R) and she may benefit from custom bracing if this does not improve during her recovery- potential fall risk. Left in bed with all needs met, bed alarm active. Anticipate she will do great in CIR setting!    Follow Up Recommendations  CIR     Equipment Recommendations  None recommended by PT (TBD next venue)    Recommendations for Other Services       Precautions / Restrictions Precautions Precautions: Fall Precaution Comments: watch gait belt placement - insulin pump Rt abdomen Restrictions Weight Bearing Restrictions: No    Mobility  Bed Mobility Overal bed mobility: Needs Assistance Bed Mobility: Supine to Sit;Sit to Supine     Supine to sit: Mod assist;HOB elevated;+2 for physical  assistance Sit to supine: Mod assist;HOB elevated;+2 for physical assistance   General bed mobility comments: Pt exited bed on LT which pt reported was "my weak side" and did require increased assist to Moderate to come to full upright sitting. Increased time for pt to scoot anteriorly to EOB.    Transfers Overall transfer level: Needs assistance Equipment used: Rolling walker (2 wheeled) Transfers: Sit to/from Stand Sit to Stand: Min assist;+2 physical assistance;From elevated surface         General transfer comment: only needed MinAx2 to boost up to standing with heavy cues for hand placement/head hips relationship and rocking forward "nose over toes" and moderately elevated bed. ModAx2 to take alternating side steps with RW, as well as to take side steps alongside EOB  Ambulation/Gait             General Gait Details: unable- fatigue/safety   Stairs             Wheelchair Mobility    Modified Rankin (Stroke Patients Only)       Balance Overall balance assessment: Needs assistance Sitting-balance support: No upper extremity supported;Feet supported Sitting balance-Leahy Scale: Fair Sitting balance - Comments: Pt needs cues to place feet flat on floor as each foot tends to rest on floor in a supinated position. Postural control: Posterior lean Standing balance support: Bilateral upper extremity supported;During functional activity Standing balance-Leahy Scale: Poor Standing balance comment: reliant on BUE support and external support, tends to lean posteriorly.  Pt worked on Doctor, hospital with forward/back steps and latearl steps with RW and Mod As of 2 people.  Cognition Arousal/Alertness: Awake/alert Behavior During Therapy: WFL for tasks assessed/performed Overall Cognitive Status: Within Functional Limits for tasks assessed                                 General Comments: very pleasant and  cooperative with therapy, motivated to improve      Exercises      General Comments General comments (skin integrity, edema, etc.): HR 120s with activity      Pertinent Vitals/Pain Pain Assessment: Faces Pain Location: Pt was reporting a headache to her RN as OT entering room. Later in session pt reported that her headache was not currently active. Pain Intervention(s): Monitored during session    Home Living                      Prior Function            PT Goals (current goals can now be found in the care plan section) Acute Rehab PT Goals Patient Stated Goal: Go to Rehab today PT Goal Formulation: With patient Time For Goal Achievement: 08/20/21 Potential to Achieve Goals: Good Progress towards PT goals: Progressing toward goals    Frequency    Min 4X/week      PT Plan Current plan remains appropriate    Co-evaluation   Reason for Co-Treatment: Complexity of the patient's impairments (multi-system involvement);For patient/therapist safety;To address functional/ADL transfers PT goals addressed during session: Mobility/safety with mobility;Balance;Proper use of DME OT goals addressed during session: ADL's and self-care;Proper use of Adaptive equipment and DME      AM-PAC PT "6 Clicks" Mobility   Outcome Measure  Help needed turning from your back to your side while in a flat bed without using bedrails?: A Little Help needed moving from lying on your back to sitting on the side of a flat bed without using bedrails?: A Lot Help needed moving to and from a bed to a chair (including a wheelchair)?: Total Help needed standing up from a chair using your arms (e.g., wheelchair or bedside chair)?: Total Help needed to walk in hospital room?: Total Help needed climbing 3-5 steps with a railing? : Total 6 Click Score: 9    End of Session Equipment Utilized During Treatment: Gait belt Activity Tolerance: Patient tolerated treatment well Patient left: in  bed;with call bell/phone within reach;with bed alarm set Nurse Communication: Mobility status PT Visit Diagnosis: Muscle weakness (generalized) (M62.81);Difficulty in walking, not elsewhere classified (R26.2);Other symptoms and signs involving the nervous system (R29.898);Hemiplegia and hemiparesis Hemiplegia - Right/Left: Left Hemiplegia - dominant/non-dominant: Non-dominant Hemiplegia - caused by: Cerebral infarction     Time: 1110-1150 PT Time Calculation (min) (ACUTE ONLY): 40 min  Charges:  $Therapeutic Activity: 8-22 mins (co-tx with OT) $Neuromuscular Re-education: 8-22 mins                    Windell Norfolk, DPT, PN2   Supplemental Physical Therapist [place]    Pager [phone] Acute Rehab Office [phone]

## 2021-08-10 NOTE — Progress Notes (Signed)
Occupational Therapy Treatment Patient Details Name: [name] MRN: [id number] DOB: 11-13-1951 Today's Date: 08/10/2021    History of present illness 70 y.o. 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 Imaging confirmed acute pontine stroke.   OT comments  Patient progressing and showed improved dynamic standing with RW, in preparation for OOB ADLs, compared to previous session. Pt also demonstrated improved EOB sitting balance during dynamic functional ADL tasks. Patient remains limited by LLE ataxia, impaired sensation and weakness along with generalized weakness and decreased activity tolerance and  deficits noted below. Pt continues to demonstrate good rehab potential and would benefit from continued skilled OT to increase safety and independence with ADLs and functional transfers to allow pt to return home safely and reduce caregiver burden and fall risk.   Follow Up Recommendations  CIR    Equipment Recommendations  None recommended by OT    Recommendations for Other Services Rehab consult    Precautions / Restrictions Precautions Precautions: Fall Precaution Comments: watch gait belt placement - insulin pump Rt abdomen Restrictions Weight Bearing Restrictions: No       Mobility Bed Mobility Overal bed mobility: Needs Assistance Bed Mobility: Supine to Sit;Sit to Supine     Supine to sit: Mod assist;HOB elevated Sit to supine: Mod assist;HOB elevated   General bed mobility comments: Pt exited bed on LT which pt reported was "my weak side" and did require increased assist to Moderate to come  to full upright sitting. Increased time for pt to scoot anteriorly to EOB.    Transfers                      Balance Overall balance assessment: Needs assistance Sitting-balance support: No upper extremity supported;Feet supported Sitting balance-Leahy Scale: Fair Sitting balance - Comments: Pt needs cues to place feet flat on floor as each foot tends to rest on floor in a supinated position. Postural control: Posterior lean Standing balance support: Bilateral upper extremity supported;During functional activity Standing balance-Leahy Scale: Poor Standing balance comment: reliant on BUE support and external support, tends to lean posteriorly.  Pt worked on Doctor, hospital with forward/back steps and latearl steps with RW and Mod As of 2 people.                           ADL either performed or assessed with clinical judgement   ADL       Grooming: Wash/dry hands;Wash/dry face;Sitting;Set up Grooming Details (indicate cue type and reason): Sitting EOB, pt washed face and hands with setup. Good sitting balance without UE support noted.             Lower Body Dressing: Total assistance;Sitting/lateral leans;Bed level Lower Body Dressing Details (indicate cue type and reason): Total Assist to don pt's tennis shoes in bed. While EOB prior to return to supine, pt's shoes untied and pt promted to use feet to kick off shoes. Pt with posterior lean while performing task, but no loss of balance and able to return to upright sitting witout external assistance. Toilet Transfer: RW;Moderate assistance;+2 for physical assistance;Cueing for sequencing Toilet Transfer Details (indicate cue type and reason): Pt stood  from EOB x 2 reps, and worked on dyanamic balance including alternating anterior placement of each foot and return to BOS as well as lateral stepping with RW and all with Moderate assist of 2.   Toileting - Clothing Manipulation Details (indicate cue type and  reason): Pure wick     Functional mobility during ADLs: Moderate assistance;+2 for physical assistance;Rolling walker;Cueing for sequencing;Cueing for safety       Vision Baseline Vision/History: Wears glasses Wears Glasses: Reading only Patient Visual Report: No change from baseline Vision Assessment?: No apparent visual deficits   Perception     Praxis      Cognition Arousal/Alertness: Awake/alert Behavior During Therapy: WFL for tasks assessed/performed Overall Cognitive Status: Within Functional Limits for tasks assessed                                          Exercises     Shoulder Instructions       General Comments      Pertinent Vitals/ Pain       Pain Location: Pt was reporting a headache to her RN as OT entering room. Later in session pt reported that her headache was not currently active. Pain Intervention(s): Monitored during session  Home Living                                          Prior Functioning/Environment              Frequency  Min 2X/week        Progress Toward Goals  OT Goals(current goals can now be found in the care plan section)  Progress towards OT goals: Progressing toward goals  Acute Rehab OT Goals Patient Stated Goal: Go to Rehab today OT Goal Formulation: With patient Time For Goal Achievement: 08/20/21 Potential to Achieve Goals: Good  Plan Discharge plan remains appropriate    Co-evaluation    PT/OT/SLP Co-Evaluation/Treatment: Yes Reason for Co-Treatment: Complexity of the patient's impairments (multi-system involvement);For patient/therapist safety;To address functional/ADL transfers PT goals addressed during session: Mobility/safety with mobility;Balance;Proper use of DME OT goals addressed during session: ADL's and self-care;Proper use of Adaptive equipment and DME      AM-PAC OT "6 Clicks" Daily Activity     Outcome Measure   Help from another person eating  meals?: None Help from another person taking care of personal grooming?: None Help from another person toileting, which includes using toliet, bedpan, or urinal?: Total Help from another person bathing (including washing, rinsing, drying)?: A Lot Help from another person to put on and taking off regular upper body clothing?: A Little Help from another person to put on and taking off regular lower body clothing?: Total 6 Click Score: 15    End of Session Equipment Utilized During Treatment: Gait belt;Rolling walker  OT Visit Diagnosis: Muscle weakness (generalized) (M62.81)   Activity Tolerance Patient tolerated treatment well   Patient Left with call bell/phone within reach;in bed;with bed alarm set;with nursing/sitter in room   Nurse Communication Mobility status        Time: 1110-1150 OT Time Calculation (min): 40 min  Charges: OT General Charges $OT Visit: 1 Visit OT Treatments $Self Care/Home Management : 8-22 mins  Anderson Malta, OT Acute Rehab Services Office: [phone] 08/10/2021   Julien Girt 08/10/2021,  2:01 PM

## 2021-08-10 NOTE — Discharge Instructions (Addendum)
Inpatient Rehab Discharge Instructions  [facility] Discharge date and time: No discharge date for patient encounter.   Activities/Precautions/ Functional Status: Activity: activity as tolerated Diet: diabetic diet Wound Care: Routine skin checks Functional status:  ___ No restrictions     ___ Walk up steps independently ___ 24/7 supervision/assistance   ___ Walk up steps with assistance ___ Intermittent supervision/assistance  ___ Bathe/dress independently ___ Walk with walker     __x_ Bathe/dress with assistance ___ Walk Independently    ___ Shower independently ___ Walk with assistance    ___ Shower with assistance ___ No alcohol     ___ Return to work/school ________  COMMUNITY REFERRALS UPON DISCHARGE:    Home Health:   PT     OT     ST                      Agency: [facility] Phone: [phone]    Special Instructions:  No driving smoking or alcoholSTROKE/TIA DISCHARGE INSTRUCTIONS SMOKING Cigarette smoking nearly doubles your risk of having a stroke & is the single most alterable risk factor  If you smoke or have smoked in the last 12 months, you are advised to quit smoking for your health. Most of the excess cardiovascular risk related to smoking disappears within a year of stopping. Ask you doctor about anti-smoking medications [facility] Quit Line: 1-800-QUIT NOW Free Smoking Cessation Classes [phone]  CHOLESTEROL Know your levels; limit fat & cholesterol in your diet  Lipid Panel     Component Value Date/Time   CHOL 102 08/04/2021 2313   CHOL 112 06/06/2021 1127   TRIG 39 08/04/2021 2313   HDL 44 08/04/2021 2313   HDL 50 06/06/2021 1127   CHOLHDL 2.3 08/04/2021 2313   VLDL 8 08/04/2021 2313   LDLCALC 50 08/04/2021 2313   LDLCALC 49 06/06/2021 1127     Many patients benefit from treatment even if their cholesterol is at goal. Goal: Total Cholesterol (CHOL) less than 160 Goal:  Triglycerides (TRIG) less than 150 Goal:  HDL greater than  40 Goal:  LDL (LDLCALC) less than 100   BLOOD PRESSURE American Stroke Association blood pressure target is less that 120/80 mm/Hg  Your discharge blood pressure is:    Monitor your blood pressure Limit your salt and alcohol intake Many individuals will require more than one medication for high blood pressure  DIABETES (A1c is a blood sugar average for last 3 months) Goal HGBA1c is under 7% (HBGA1c is blood sugar average for last 3 months)  Diabetes:   Lab Results  Component Value Date   HGBA1C 10.3 (H) 08/04/2021    Your HGBA1c can be lowered with medications, healthy diet, and exercise. Check your blood sugar as directed by your physician Call your physician if you experience unexplained or low blood sugars.  PHYSICAL ACTIVITY/REHABILITATION Goal is 30 minutes at least 4 days per week  Activity: Increase activity slowly, Therapies: Physical Therapy: Home Health Return to work:  Activity decreases your risk of heart attack and stroke and makes your heart stronger.  It helps control your weight and blood pressure; helps you relax and can improve your mood. Participate in a regular exercise program. Talk with your doctor about the best form of exercise for you (dancing, walking, swimming, cycling).  DIET/WEIGHT Goal is to maintain a healthy weight  Your discharge diet is:  Diet Order     None       liquids Your  height is:    Your current weight is:   Your Body Mass Index (BMI) is:    Following the type of diet specifically designed for you will help prevent another stroke. Your goal weight range is:   Your goal Body Mass Index (BMI) is 19-24. Healthy food habits can help reduce 3 risk factors for stroke:  High cholesterol, hypertension, and excess weight.  RESOURCES Stroke/Support Group:  Call [phone]   STROKE EDUCATION PROVIDED/REVIEWED AND GIVEN TO PATIENT Stroke warning signs and symptoms How to activate emergency medical system (call 911). Medications prescribed at  discharge. Need for follow-up after discharge. Personal risk factors for stroke. Pneumonia vaccine given: No Flu vaccine given: No My questions have been answered, the writing is legible, and I understand these instructions.  I will adhere to these goals & educational materials that have been provided to me after my discharge from the hospital.      My questions have been answered and I understand these instructions. I will adhere to these goals and the provided educational materials after my discharge from the hospital.  Patient/Caregiver Signature _______________________________ Date __________  Clinician Signature _______________________________________ Date __________  Please bring this form and your medication list with you to all your follow-up doctor's appointments.

## 2021-08-11 DIAGNOSIS — I639 Cerebral infarction, unspecified: Secondary | ICD-10-CM | POA: Diagnosis not present

## 2021-08-11 LAB — COMPREHENSIVE METABOLIC PANEL
ALT: 19 U/L (ref 0–44)
AST: 20 U/L (ref 15–41)
Albumin: 2.8 g/dL — ABNORMAL LOW (ref 3.5–5.0)
Alkaline Phosphatase: 78 U/L (ref 38–126)
Anion gap: 12 (ref 5–15)
BUN: 17 mg/dL (ref 8–23)
CO2: 26 mmol/L (ref 22–32)
Calcium: 9.4 mg/dL (ref 8.9–10.3)
Chloride: 99 mmol/L (ref 98–111)
Creatinine, Ser: 1.26 mg/dL — ABNORMAL HIGH (ref 0.44–1.00)
GFR, Estimated: 46 mL/min — ABNORMAL LOW (ref >60)
Glucose, Bld: 235 mg/dL — ABNORMAL HIGH (ref 70–99)
Potassium: 4.1 mmol/L (ref 3.5–5.1)
Sodium: 137 mmol/L (ref 135–145)
Total Bilirubin: 0.5 mg/dL (ref 0.3–1.2)
Total Protein: 6.1 g/dL — ABNORMAL LOW (ref 6.5–8.1)

## 2021-08-11 LAB — CBC WITH DIFFERENTIAL/PLATELET
Abs Immature Granulocytes: 0 10*3/uL (ref 0.00–0.07)
Basophils Absolute: 0 10*3/uL (ref 0.0–0.1)
Basophils Relative: 1 %
Eosinophils Absolute: 0.1 10*3/uL (ref 0.0–0.5)
Eosinophils Relative: 3 %
HCT: 36.3 % (ref 36.0–46.0)
Hemoglobin: 11.4 g/dL — ABNORMAL LOW (ref 12.0–15.0)
Immature Granulocytes: 0 %
Lymphocytes Relative: 31 %
Lymphs Abs: 1.4 10*3/uL (ref 0.7–4.0)
MCH: 28.1 pg (ref 26.0–34.0)
MCHC: 31.4 g/dL (ref 30.0–36.0)
MCV: 89.4 fL (ref 80.0–100.0)
Monocytes Absolute: 0.8 10*3/uL (ref 0.1–1.0)
Monocytes Relative: 18 %
Neutro Abs: 2.1 10*3/uL (ref 1.7–7.7)
Neutrophils Relative %: 47 %
Platelets: 179 10*3/uL (ref 150–400)
RBC: 4.06 MIL/uL (ref 3.87–5.11)
RDW: 12.9 % (ref 11.5–15.5)
WBC: 4.5 10*3/uL (ref 4.0–10.5)
nRBC: 0 % (ref 0.0–0.2)

## 2021-08-11 LAB — GLUCOSE, CAPILLARY
Glucose-Capillary: 213 mg/dL — ABNORMAL HIGH (ref 70–99)
Glucose-Capillary: 254 mg/dL — ABNORMAL HIGH (ref 70–99)
Glucose-Capillary: 172 mg/dL — ABNORMAL HIGH (ref 70–99)

## 2021-08-11 MED ORDER — MUPIROCIN 2 % EX OINT
TOPICAL_OINTMENT | Freq: Two times a day (BID) | CUTANEOUS | Status: DC
  Administered 2021-08-20: 1 via TOPICAL
  Filled 2021-08-11 (×2): qty 22

## 2021-08-11 MED ORDER — ACETAMINOPHEN 325 MG PO TABS
650.0000 mg | ORAL_TABLET | Freq: Four times a day (QID) | ORAL | Status: DC | PRN
  Administered 2021-08-11 – 2021-08-30 (×17): 650 mg via ORAL
  Filled 2021-08-11 (×21): qty 2

## 2021-08-11 NOTE — Consult Note (Signed)
[facility] Nurse wound follow up Re consulted after being assessed 08/05/21 by my partner.  Orders implemented at that time.  Wound type: Ruptured bulla to right shin and left lateral heel unstageable pressure injury with stable dry eschar.  Measurement: Left lateral heel:  2 cmx 2 cm eschar, dry Right anterior lower leg:  3 cm x 2 cm x 0.2 cm ruptured bulla  Wound bed: right leg wound is red and dry, will implement topical orders to promote moist wound healing Drainage (amount, consistency, odor) none  dry Periwound: intact Dressing procedure/placement/frequency: Cleanse right anterior lower leg wound with NS and pat dry. Apply mupirocin ointment to wound bed and cover with silicone foam. Reapply ointment each shift and change dressing every three days.  Paint left heel eschar with betadine daily.  Will not follow at this time.  Please re-consult if needed.  Domenic Moras MSN, RN, FNP-BC CWON Wound, Ostomy, Continence Nurse Pager [phone]

## 2021-08-11 NOTE — Progress Notes (Signed)
Inpatient Diabetes Program Recommendations  AACE/ADA: New Consensus Statement on Inpatient Glycemic Control (2015)  Target Ranges:  Prepandial:   less than 140 mg/dL      Peak postprandial:   less than 180 mg/dL (1-2 hours)      Critically ill patients:  140 - 180 mg/dL   Lab Results  Component Value Date   GLUCAP 254 (H) 08/11/2021   HGBA1C 10.3 (H) 08/04/2021    Review of Glycemic Control Results for [name], [name] "[name]" (MRN [id number]) as of 08/11/2021 12:06  Ref. Range 08/10/2021 08:05 08/10/2021 12:17 08/10/2021 16:53 08/11/2021 07:28 08/11/2021 11:20  Glucose-Capillary Latest Ref Range: 70 - 99 mg/dL 156 (H) 178 (H) 184 (H) 213 (H) 254 (H)   Inpatient Diabetes Program Recommendations:   Spoke with patient regarding followup with Dr. Almetta Lovely office. Patient left message @ Dr. Almetta Lovely office yesterday. Patient plans to call Dr. Almetta Lovely office and speak with Dr. Almetta Lovely nurse regarding insulin pump settings of going hypoglycemic during the night. Reviewed with patient when she is to cover carbs and correction of CBGs and patient verbalized understanding.  Thank you, [name]. [name] Elrod, RN, MSN, CDE  Diabetes Coordinator Inpatient Glycemic Control Team Team Pager [phone] (8am-5pm) 08/11/2021 12:18 PM

## 2021-08-11 NOTE — Progress Notes (Signed)
PROGRESS NOTE   Subjective/Complaints: Arthritis pain this am   ROS- neg CP, SOB, N/V/D   Objective:   No results found. Recent Labs    08/11/21 0456  WBC 4.5  HGB 11.4*  HCT 36.3  PLT 179   Recent Labs    08/09/21 0308 08/11/21 0456  NA 137 137  K 3.8 4.1  CL 103 99  CO2 25 26  GLUCOSE 117* 235*  BUN 14 17  CREATININE 1.10* 1.26*  CALCIUM 8.9 9.4    Intake/Output Summary (Last 24 hours) at 08/11/2021 0829 Last data filed at 08/11/2021 0700 Gross per 24 hour  Intake 240 ml  Output --  Net 240 ml        Physical Exam: Vital Signs Blood pressure 128/62, pulse 71, temperature 98.7 F (37.1 C), resp. rate 18, height '5\' 7"'$  (1.702 m), weight 118.9 kg, SpO2 98 %.   General: No acute distress Mood and affect are appropriate Heart: Regular rate and rhythm no rubs murmurs or extra sounds Lungs: Clear to auscultation, breathing unlabored, no rales or wheezes Abdomen: Positive bowel sounds, soft nontender to palpation, nondistended Extremities: No clubbing, cyanosis, or edema Skin: No evidence of breakdown, no evidence of rash Neurologic: Cranial nerves II through XII intact, motor strength is 5/5 in bilateral deltoid, bicep, tricep, grip, hip flexor, knee extensors, ankle dorsiflexor and plantar flexor Sensory exam normal sensation to light touch and proprioception in bilateral upper and lower extremities Cerebellar exam normal finger to nose to finger as well as heel to shin in bilateral upper and lower extremities Musculoskeletal: Full range of motion in all 4 extremities. No joint swelling   Assessment/Plan: 1. Functional deficits which require 3+ hours per day of interdisciplinary therapy in a comprehensive inpatient rehab setting. Physiatrist is providing close team supervision and 24 hour management of active medical problems listed below. Physiatrist and rehab team continue to assess barriers to  discharge/monitor patient progress toward functional and medical goals  Care Tool:  Bathing              Bathing assist       Upper Body Dressing/Undressing Upper body dressing        Upper body assist      Lower Body Dressing/Undressing Lower body dressing            Lower body assist       Toileting Toileting    Toileting assist Assist for toileting: Maximal Assistance - Patient 25 - 49%     Transfers Chair/bed transfer  Transfers assist           Locomotion Ambulation   Ambulation assist              Walk 10 feet activity   Assist           Walk 50 feet activity   Assist           Walk 150 feet activity   Assist           Walk 10 feet on uneven surface  activity   Assist           Wheelchair     Assist  Wheelchair 50 feet with 2 turns activity    Assist            Wheelchair 150 feet activity     Assist          Blood pressure 128/62, pulse 71, temperature 98.7 F (37.1 C), resp. rate 18, height '5\' 7"'$  (1.702 m), weight 118.9 kg, SpO2 98 %.  Medical Problem List and Plan: 1.  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  Antithrombotics: -DVT/anticoagulation: Eliquis Mechanical: Sequential compression devices, below knee Bilateral lower extremities Pharmaceutical: Lovenox             -antiplatelet therapy: N/A 3. Pain Management: Neurontin 100 mg 3 times daily             Monitor with increased exertion 4. Mood: Prozac 30 mg daily             -antipsychotic agents: N/A 5. Neuropsych: This patient is capable of making decisions on her own behalf. 6. Skin/Wound Care/W OC  follow-up: Routine skin checks.  Left heel and right shin wounds 7. Fluids/Electrolytes/Nutrition: Routine in and outs             CMP ordered for tomorrow. 8.  Hypertension.  Norvasc 10 mg daily.               Monitor with increased mobility 9.  History of atrial fibrillation status post pacemaker.  Cardiac rate controlled.  Continue Eliquis 10.  Diabetes mellitus with peripheral neuropathy.  Patient does have an insulin pump that is being utilized          CBG (last 3)  Recent Labs    08/10/21 1217 08/10/21 1653 08/11/21 0728  GLUCAP 178* 184* 213*  Consult diabetic RN for insulin pump management   11.  Diastolic congestive heart failure.  Demadex 10 mg daily.  Monitor for any signs of fluid overload      Filed Weights    08/08/21 0611 08/09/21 0604 08/10/21 0500  Weight: 123.4 kg 121.7 kg 121.9 kg    12.  Hyperlipidemia.  Lovaza/Lipitor 13.  Hypothyroidism.  Synthroid 14.  GERD.  Protonix 15.  Morbid obesity.  BMI 42.02.  Dietary follow-up.  Encourage weight loss 17.  OSA.  BiPAP    LOS: 1 days A FACE TO FACE EVALUATION WAS PERFORMED  Charlett Blake 08/11/2021, 8:29 AM

## 2021-08-11 NOTE — Evaluation (Signed)
Physical Therapy Assessment and Plan  Patient Details  Name: [name] MRN: [id number] Date of Birth: 1951/04/02  PT Diagnosis: Abnormal posture, Abnormality of gait, Ataxic gait, Difficulty walking, Hemiparesis non-dominant, Impaired sensation, and Muscle weakness Rehab Potential: Good ELOS: 2-2.5 weeks   Today's Date: 08/11/2021 PT Individual Time: 5027-7412 PT Individual Time Calculation (min): 60 min    Hospital Problem: Principal Problem:   Left pontine cerebrovascular accident [facility])   Past Medical History:  Past Medical History:  Diagnosis Date   Abnormal echocardiogram    Anemia    Arthritis    BPPV (benign paroxysmal positional vertigo), right 06/13/2017   Bradycardia 03/07/2020   Cerebrovascular accident (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 ([place]) 10/29/2015   CHB (complete heart block) ([place]) 03/08/2020   CHF (congestive heart failure) ([place])    Closed fracture of left proximal humerus 07/08/2019   Decreased range of motion of left shoulder 09/03/2020   Depression    Dizzy 03/07/2020   bradycardia   Edentulous 09/03/2020   GERD (gastroesophageal reflux disease)    Hemiparesis affecting left side as late effect of cerebrovascular accident (CVA) ([place]) 03/08/2020   Hemorrhoids    History of Falls with injury 03/08/2020   Fall resulting in left humeral fracture   Hyperlipidemia    Hypertension    Hypothyroidism    Intracranial vascular stenosis    Lacunar infarction ([place])    Brain MRI 07/2021 multiple small remote pontine lacunar infarcts   Near syncope 03/08/2020   Paroxysmal atrial fibrillation (HCC)    Presence of permanent cardiac pacemaker    Proteinuria 03/08/2020   Seasonal allergies    Stroke ([place])    Thyroid cancer ([place])    per pt S/p Total Thyroidectomy with Radioactive Iodine Therapy   Type 1 diabetes mellitus ([place])    Past Surgical History:  Past Surgical History:  Procedure Laterality Date   ABDOMINAL ANGIOGRAM N/A  05/14/2012   Procedure: ABDOMINAL ANGIOGRAM;  Surgeon: Laverda Page, MD;  Location: [facility] CATH LAB;  Service: Cardiovascular;  Laterality: N/A;   ABDOMINAL HYSTERECTOMY  1995   partial   ANKLE FRACTURE SURGERY Right    CARDIAC CATHETERIZATION     with coronary angiogram   cataract  Bilateral 2018   COLONOSCOPY N/A 09/09/2014   Procedure: COLONOSCOPY;  Surgeon: Gatha Mayer, MD;  Location: [place];  Service: Endoscopy;  Laterality: N/A;   ESOPHAGOGASTRODUODENOSCOPY (EGD) WITH PROPOFOL N/A 12/03/2018   Procedure: ESOPHAGOGASTRODUODENOSCOPY (EGD) WITH PROPOFOL;  Surgeon: Doran Stabler, MD;  Location: [place];  Service: Gastroenterology;  Laterality: N/A;   LEFT AND RIGHT HEART CATHETERIZATION WITH CORONARY ANGIOGRAM N/A 05/14/2012   Procedure: LEFT AND RIGHT HEART CATHETERIZATION WITH CORONARY ANGIOGRAM;  Surgeon: Laverda Page, MD;  Location: [facility] CATH LAB;  Service: Cardiovascular;  Laterality: N/A;   LOOP RECORDER INSERTION N/A 12/23/2018   Procedure: LOOP RECORDER INSERTION;  Surgeon: Constance Haw, MD;  Location: [place] CV LAB;  Service: Cardiovascular;  Laterality: N/A;   LOOP RECORDER REMOVAL N/A 06/25/2020   Procedure: LOOP RECORDER REMOVAL;  Surgeon: Constance Haw, MD;  Location: [place] CV LAB;  Service: Cardiovascular;  Laterality: N/A;   MINI METER   09/08/2014   ELECTRONIC INSULIN PUMP   PACEMAKER IMPLANT N/A 06/25/2020   Procedure: PACEMAKER IMPLANT;  Surgeon: Constance Haw, MD;  Location: [place] CV LAB;  Service: Cardiovascular;  Laterality: N/A;   TEE WITHOUT CARDIOVERSION N/A 10/16/2018   Procedure: TRANSESOPHAGEAL ECHOCARDIOGRAM (TEE);  Surgeon: Buford Dresser, MD;  Location: [facility] ENDOSCOPY;  Service: Cardiovascular;  Laterality: N/A;   THYROIDECTOMY  2006    Assessment & Plan Clinical Impression: Patient is a 70 y.o.  right-handed female with history of diabetes mellitus with insulin pump, prior CVA with residual  left-sided weakness as well as dysarthria, atrial fibrillation maintained on Eliquis, diastolic congestive heart failure, history of complete heart block status post pacemaker 06/25/2020 per Dr. Curt Bears, OSA with CPAP, hypertension, hyperlipidemia, obesity, thyroid cancer with thyroidectomy and obesity with BMI 42.02.  History taken from chart review and patient. Patient lives with relatives.  Multilevel home bed and bath main level with ramped entrance.  Ambulates short distances with platform Rollator.  Granddaughter assist with set up for sponge bath and basic ADLs.  She presented on 08/04/2021 with increasing left hemiparesis and worsening dysarthria.  CT/MRI showed small acute infarct in the left pons.  No significant edema or mass-effect.  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  Remote pontine microhemorrhage.  Echocardiogram with ejection fraction of 60 to 65%, no wall motion abnormalities.  Admission chemistries unremarkable except glucose 152, creatinine 1.28, BNP 130.6.  Patient did not receive tPA.  Neurology follow-up patient currently remains on Eliquis as prior to admission.  Her insulin pump is being utilized for diabetes mellitus.  WOC follow-up 08/05/2021 for left heel and right shin wounds with skin care as directed.  Tolerating a regular consistency diet.  Therapy evaluations completed due to patient's left-sided hemiparesis and dysarthric speech was admitted for a comprehensive rehab program.  Please see preadmission assessment from earlier today as well. Patient transferred to CIR on 08/10/2021 .   Patient currently requires mod assist with mobility secondary to muscle weakness and muscle joint tightness, decreased cardiorespiratoy endurance, impaired timing and sequencing, abnormal tone, unbalanced muscle activation, ataxia, decreased coordination, and decreased motor planning, and  decreased sitting balance, decreased standing balance, decreased postural control, and decreased balance strategies.  Prior to hospitalization, patient was modified independent  with mobility using primarily power wheelchair and lived with Family in a House home.  Home access is  Ramped entrance.  Patient will benefit from skilled PT intervention to maximize safe functional mobility, minimize fall risk, and decrease caregiver burden for planned discharge home with 24 hour supervision.  Anticipate patient will benefit from follow up [place] at discharge.  PT - End of Session Activity Tolerance: Tolerates 30+ min activity with multiple rests Endurance Deficit: Yes Endurance Deficit Description: frequent seated rest break with pt becoming SOB after limited activity PT Assessment Rehab Potential (ACUTE/IP ONLY): Good PT Barriers to Discharge: Decreased caregiver support;Wound Care;Weight PT Patient demonstrates impairments in the following area(s): Balance;Perception;Behavior;Safety;Edema;Sensory;Endurance;Skin Integrity;Motor;Nutrition;Pain PT Transfers Functional Problem(s): Bed to Chair;Bed Mobility;Car;Furniture PT Locomotion Functional Problem(s): Ambulation;Wheelchair Mobility PT Plan PT Intensity: Minimum of 1-2 x/day ,45 to 90 minutes PT Frequency: 5 out of 7 days PT Duration Estimated Length of Stay: 2-2.5 weeks PT Treatment/Interventions: Ambulation/gait training;Community reintegration;DME/adaptive equipment instruction;Neuromuscular re-education;Psychosocial support;UE/LE Strength taining/ROM;Wheelchair propulsion/positioning;Balance/vestibular training;Discharge planning;Functional electrical stimulation;Pain management;Skin care/wound management;Therapeutic Activities;UE/LE Coordination activities;Cognitive remediation/compensation;Disease management/prevention;Functional mobility training;Patient/family education;Splinting/orthotics;Therapeutic Exercise;Visual/perceptual  remediation/compensation PT Transfers Anticipated Outcome(s): supervision PT Locomotion Anticipated Outcome(s): supervision using LRAD PT Recommendation Follow Up Recommendations: Home health PT;24 hour supervision/assistance Patient destination: Home Equipment Recommended: To be determined   PT Evaluation Precautions/Restrictions Precautions Precautions: Fall;Other (comment) Precaution Comments: watch gait belt placement - insulin pump Rt abdomen Restrictions Weight Bearing Restrictions: No  Pain Pain Assessment Pain Scale: 0-10 Pain Score: 0-No pain Home Living/Prior Functioning Home Living Available Help at Discharge: Family;Available PRN/intermittently Type of Home: House Home Access: Ramped entrance Home Layout: Two level;Able to live on main level with bedroom/bathroom Alternate Level Stairs-Number of Steps: stays on the main level Additional Comments: PF rollator  Lives With: Family (sister [name]) and 13y.o. granddaughter (Miracle)) Prior Function Level of Independence: Requires assistive device for independence (uses bedrail to get in/out of bed mod-I, intermittent assist to come to stand from chair otherwise can use lift recliner to come to stand (decreased amount of standing after breaking L arm >2years ago))  Able to Take Stairs?: No (ramped entrance) Driving: No Comments: bought power w/c off [employer] for community mobility (lightweight), uses power w/c from [employer] in the home b/c it is heavier, primarily ambulates ~10steps using platform rollator to/from bathroom due to frequent falls with impaired balance Perception  Perception: Within Functional Limits Praxis Praxis: Intact  Cognition Overall Cognitive Status: Within Functional Limits for tasks assessed Arousal/Alertness: Awake/alert Orientation Level: Oriented X4 Attention: Focused;Sustained Focused Attention: Appears intact Sustained Attention: Appears intact Awareness: Impaired Awareness Impairment:  Emergent impairment Problem Solving: Appears intact Safety/Judgment: Appears intact Comments: Pt running into objects with her power wheelchair, cuing to slow down the speed. Sensation Sensation Light Touch: Impaired Detail Peripheral sensation comments: hx of peripheral neuropathy - pt reports increased pain sensitivity to touch in B LEs Light Touch Impaired Details: Impaired RLE;Impaired LLE Hot/Cold: Not tested Proprioception: Impaired by gross assessment (in B LEs during functional tasks) Stereognosis: Not tested Coordination Gross Motor Movements are Fluid and Coordinated: No Fine Motor Movements are Fluid and Coordinated: No Coordination and Movement Description: GM movements impaired in B LEs with uncoordianted movements and possible spasticity Motor  Motor Motor: Hemiplegia;Abnormal postural alignment and control Motor - Skilled Clinical Observations: Pt with mild Left hemiparesis from previous CVA.   Trunk/Postural Assessment  Cervical Assessment Cervical Assessment: Exceptions to [facility] (forward head) Thoracic Assessment Thoracic Assessment: Exceptions to [name] (thoracic kyphosis with rounded shoulders) Lumbar Assessment Lumbar Assessment: Exceptions to [facility] (posterior pelvic tilt)  Balance Balance Balance Assessed: Yes Static Sitting Balance Static Sitting - Balance Support: Feet supported Static Sitting - Level of Assistance: 5: Stand by assistance Dynamic Sitting Balance Dynamic Sitting - Balance Support: During functional activity Dynamic Sitting - Level of Assistance: 5: Stand by assistance;Other (comment) (CGA) Static Standing Balance Static Standing - Balance Support: During functional activity;Bilateral upper extremity supported Static Standing - Level of Assistance: 4: Min assist Dynamic Standing Balance Dynamic Standing - Balance Support: During functional activity;Bilateral upper extremity supported Dynamic Standing - Level of Assistance: 3: Mod assist;2: Max  assist Extremity Assessment      RLE Assessment RLE Assessment: Exceptions to [facility] Active Range of Motion (AROM) Comments: general "stiffness" in her LE movements but WFL ROM RLE Strength Right Hip Flexion: 4+/5 Right Knee Flexion: 4+/5 Right Knee Extension: 4+/5 Right Ankle Dorsiflexion: 4+/5 Right Ankle Plantar Flexion: 4+/5 LLE Assessment LLE Assessment: Exceptions to [facility] Active Range of Motion (AROM) Comments: general "stiffness" in her LE movements but WFL ROM except slight decreased L ankle DF ROM LLE Strength Left Hip Flexion: 4/5 Left Knee Flexion: 4+/5 Left Knee Extension: 4/5 Left Ankle Dorsiflexion: 4/5 Left Ankle Plantar Flexion: 4/5  Care Tool Care Tool Bed Mobility Roll left and right activity   Roll left and right assist level: Moderate Assistance - Patient 50 - 74% Roll left and right assistive device comment: bedrail  Sit to  lying activity   Sit to lying assist level: Maximal Assistance - Patient 25 - 49% Sit to lying assistive device comment: bedrail  Lying to sitting edge of bed activity   Lying to sitting edge of bed assist level: Maximal Assistance - Patient 25 - 49% Lying to sitting edge of bed assist device comment: bedrail   Care Tool Transfers Sit to stand transfer   Sit to stand assist level: Moderate Assistance - Patient 50 - 74% Sit to stand assistive device: Armrests;Walker  Chair/bed transfer   Chair/bed transfer assist level: Maximal Assistance - Patient 25 - 49% Chair/bed transfer assistive device: Armrests;Walker   Physiological scientist transfer assist level: Moderate Assistance - Patient 50 - 74%      Care Tool Locomotion Ambulation   Assist level: Moderate Assistance - Patient 50 - 74% Assistive device: Walker-rolling Max distance: 23ft  Walk 10 feet activity   Assist level: Moderate Assistance - Patient - 50 - 74% Assistive device: Walker-rolling   Walk 50 feet with 2 turns activity Walk 50 feet with 2  turns activity did not occur: Safety/medical concerns      Walk 150 feet activity Walk 150 feet activity did not occur: Safety/medical concerns      Walk 10 feet on uneven surfaces activity Walk 10 feet on uneven surfaces activity did not occur: Safety/medical concerns      Stairs Stair activity did not occur: Safety/medical concerns        Walk up/down 1 step activity Walk up/down 1 step or curb (drop down) activity did not occur: Safety/medical concerns     Walk up/down 4 steps activity did not occuR: Safety/medical concerns  Walk up/down 4 steps activity      Walk up/down 12 steps activity Walk up/down 12 steps activity did not occur: Safety/medical concerns      Pick up small objects from floor   Pick up small object from the floor assist level: Total Assistance - Patient < 25%    Wheelchair Will patient use wheelchair at discharge?: Yes Type of Wheelchair: Power   Wheelchair assist level: Supervision/Verbal cueing;Set up assist Max wheelchair distance: 200  Wheel 50 feet with 2 turns activity   Assist Level: Supervision/Verbal cueing  Wheel 150 feet activity   Assist Level: Supervision/Verbal cueing    Refer to Care Plan for Long Term Goals  SHORT TERM GOAL WEEK 1 PT Short Term Goal 1 (Week 1): Pt will perform supine<>sit using bedrail with min assist PT Short Term Goal 2 (Week 1): Pt will perform sit<>stands using LRAD with CGA PT Short Term Goal 3 (Week 1): Pt will perform bed<>chair transfers using LRAD with CGA PT Short Term Goal 4 (Week 1): Pt will ambulate at least 55ft using LRAD with CGA  Recommendations for other services: None   Skilled Therapeutic Intervention Pt received sitting in her personal power wheelchair and agreeable to therapy session. Evaluation completed (see details above) with patient education regarding purpose of PT evaluation, PT POC and goals, therapy schedule, weekly team meetings, and other CIR information including safety plan and  fall risk safety. Pt completed the below mobility tasks with the specified levels of assistance. During sit<>stand transfers power w/c<>RW demos improved ability to come to standing when pushing up with BUEs from w/c arm rests. During gait training pt demos significantly uncoordinated movements with possible ataxia or spasticity with pt lacking flexion during swing and having uncontrolled placement on  initial contact (pt advances LEs forward in extended position with poor control over the movement) as well as keeps AD too far forward. Pt demos same uncoordinated movement in B LEs during stand pivot transfers causing her to bump her legs on the wheelchair. During car transfer pt requires max cuing for sequencing stand pivot transfer for increased safety as pt attempting unsafe hand placements. At end of session pt left seated in power wheelchair with needs in reach, seat belt alarm on, and meal tray set-up.    Mobility Bed Mobility Bed Mobility: Supine to Sit;Sit to Supine Supine to Sit: Maximal Assistance - Patient - Patient 25-49% Sit to Supine: Maximal Assistance - Patient 25-49% Transfers Transfers: Sit to Stand;Stand to Sit;Stand Pivot Transfers Sit to Stand: Moderate Assistance - Patient 50-74% Stand to Sit: Moderate Assistance - Patient 50-74% Stand Pivot Transfers: Moderate Assistance - Patient 50 - 74% Stand Pivot Transfer Details: Tactile cues for sequencing;Tactile cues for posture;Verbal cues for safe use of DME/AE;Verbal cues for precautions/safety;Verbal cues for gait pattern;Verbal cues for sequencing;Verbal cues for technique;Tactile cues for weight shifting;Tactile cues for placement;Tactile cues for initiation Transfer (Assistive device): Rolling walker Locomotion  Gait Ambulation: Yes Gait Assistance: Moderate Assistance - Patient 50-74%;Minimal Assistance - Patient > 75% Gait Distance (Feet): 20 Feet Assistive device: Rolling walker Gait Assistance Details: Tactile cues for  sequencing;Tactile cues for weight shifting;Tactile cues for posture;Verbal cues for safe use of DME/AE;Verbal cues for gait pattern;Verbal cues for sequencing;Verbal cues for technique;Verbal cues for precautions/safety Gait Gait: Yes Gait Pattern: Impaired Gait Pattern: Step-through pattern;Decreased hip/knee flexion - right;Decreased hip/knee flexion - left;Decreased dorsiflexion - right;Decreased dorsiflexion - left;Wide base of support;Ataxic;Trunk flexed (uncoordinated B LE movements with decreased flexion during swing and wide BOS during stance; cuing to prevent excessive anterior movement of AD) Gait velocity: decreased Stairs / Additional Locomotion Ramp: Other (comment) (performs at power w/c level with supervision) Curb: Other (comment) (unable) Architect: Yes Wheelchair Assistance: Supervision/Verbal cueing (power wheelchair) Environmental health practitioner: Right upper extremity Wheelchair Parts Management: Needs assistance Distance: >256f   Discharge Criteria: Patient will be discharged from PT if patient refuses treatment 3 consecutive times without medical reason, if treatment goals not met, if there is a change in medical status, if patient makes no progress towards goals or if patient is discharged from hospital.  The above assessment, treatment plan, treatment alternatives and goals were discussed and mutually agreed upon: by patient  CTawana Scale, PT, DPT, NCS, CSRS 08/11/2021, 3:27 PM

## 2021-08-11 NOTE — Progress Notes (Signed)
Inpatient Rehabilitation Care Coordinator Assessment and Plan Patient Details  Name: [name] MRN: [id number] Date of Birth: Nov 10, 1951  Today's Date: 08/11/2021  Hospital Problems: Principal Problem:   Left pontine cerebrovascular accident [facility])  Past Medical History:  Past Medical History:  Diagnosis Date   Abnormal echocardiogram    Anemia    Arthritis    BPPV (benign paroxysmal positional vertigo), right 06/13/2017   Bradycardia 03/07/2020   Cerebrovascular accident (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 ([place]) 10/29/2015   CHB (complete heart block) ([place]) 03/08/2020   CHF (congestive heart failure) ([place])    Closed fracture of left proximal humerus 07/08/2019   Decreased range of motion of left shoulder 09/03/2020   Depression    Dizzy 03/07/2020   bradycardia   Edentulous 09/03/2020   GERD (gastroesophageal reflux disease)    Hemiparesis affecting left side as late effect of cerebrovascular accident (CVA) ([place]) 03/08/2020   Hemorrhoids    History of Falls with injury 03/08/2020   Fall resulting in left humeral fracture   Hyperlipidemia    Hypertension    Hypothyroidism    Intracranial vascular stenosis    Lacunar infarction ([place])    Brain MRI 07/2021 multiple small remote pontine lacunar infarcts   Near syncope 03/08/2020   Paroxysmal atrial fibrillation ([facility])    Presence of permanent cardiac pacemaker    Proteinuria 03/08/2020   Seasonal allergies    Stroke ([place])    Thyroid cancer ([place])    per pt S/p Total Thyroidectomy with Radioactive Iodine Therapy   Type 1 diabetes mellitus ([place])    Past Surgical History:  Past Surgical History:  Procedure Laterality Date   ABDOMINAL ANGIOGRAM N/A 05/14/2012   Procedure: ABDOMINAL ANGIOGRAM;  Surgeon: Laverda Page, MD;  Location: [facility] CATH LAB;  Service: Cardiovascular;  Laterality: N/A;   ABDOMINAL HYSTERECTOMY  1995   partial   ANKLE FRACTURE SURGERY Right    CARDIAC CATHETERIZATION     with  coronary angiogram   cataract  Bilateral 2018   COLONOSCOPY N/A 09/09/2014   Procedure: COLONOSCOPY;  Surgeon: Gatha Mayer, MD;  Location: [place];  Service: Endoscopy;  Laterality: N/A;   ESOPHAGOGASTRODUODENOSCOPY (EGD) WITH PROPOFOL N/A 12/03/2018   Procedure: ESOPHAGOGASTRODUODENOSCOPY (EGD) WITH PROPOFOL;  Surgeon: Doran Stabler, MD;  Location: [place];  Service: Gastroenterology;  Laterality: N/A;   LEFT AND RIGHT HEART CATHETERIZATION WITH CORONARY ANGIOGRAM N/A 05/14/2012   Procedure: LEFT AND RIGHT HEART CATHETERIZATION WITH CORONARY ANGIOGRAM;  Surgeon: Laverda Page, MD;  Location: [facility] CATH LAB;  Service: Cardiovascular;  Laterality: N/A;   LOOP RECORDER INSERTION N/A 12/23/2018   Procedure: LOOP RECORDER INSERTION;  Surgeon: Constance Haw, MD;  Location: [place] CV LAB;  Service: Cardiovascular;  Laterality: N/A;   LOOP RECORDER REMOVAL N/A 06/25/2020   Procedure: LOOP RECORDER REMOVAL;  Surgeon: Constance Haw, MD;  Location: [place] CV LAB;  Service: Cardiovascular;  Laterality: N/A;   MINI METER   09/08/2014   ELECTRONIC INSULIN PUMP   PACEMAKER IMPLANT N/A 06/25/2020   Procedure: PACEMAKER IMPLANT;  Surgeon: Constance Haw, MD;  Location: [place] CV LAB;  Service: Cardiovascular;  Laterality: N/A;   TEE WITHOUT CARDIOVERSION N/A 10/16/2018   Procedure: TRANSESOPHAGEAL ECHOCARDIOGRAM (TEE);  Surgeon: Buford Dresser, MD;  Location: [facility] ENDOSCOPY;  Service: Cardiovascular;  Laterality: N/A;   THYROIDECTOMY  2006   Social History:  reports that she has never smoked. She has never used smokeless tobacco. She reports  that she does not drink alcohol and does not use drugs.  Family / Support Systems Marital Status: Divorced Patient Roles: Parent, Other (Comment), Caregiver (sister) Children: Grown son 2 yo local but is disabled 53 yo adopted Landscape architect Other Supports: Barbara-sister [phone]   Candance-niece  [phone] Anticipated Caregiver: [name] Ability/Limitations of Caregiver: [name] works during the day and pt is alone Careers adviser: Evenings only Family Dynamics: Close with grown son and sister along with granddaughter. She has a few friends who call to check on her.  Social History Preferred language: English Religion: Baptist Cultural Background: no issues Education: Data processing manager: Yes Write: Yes Employment Status: Retired Public relations account executive Issues: No issues Guardian/Conservator: None-according to MD pt is capable of making her own decisions while here   Abuse/Neglect Abuse/Neglect Assessment Can Be Completed: Yes Physical Abuse: Denies Verbal Abuse: Denies Sexual Abuse: Denies Exploitation of patient/patient's resources: Denies Self-Neglect: Denies Possible abuse reported to:: [place] of social services  Emotional Status Pt's affect, behavior and adjustment status: Pt is motivated to do well and recover from this stroke, she has had a past stroke in 02/2020 and did well and was ambulating at home with a walker and her power chair. She prides herself on being independent and assisting others Recent Psychosocial Issues: other health issues and stress from 70 yo teenager Psychiatric History: No history deferred depression screen due to adjusting to rehab and able to verbalize her concerns and feelings. Will continue to re-assess if neuro-psych needed Substance Abuse History: No issues  Patient / Family Perceptions, Expectations & Goals Pt/Family understanding of illness & functional limitations: Pt and sister have a good understanding of her stroke and deficits. She talks with the MD and feels has a plan moving forward for her treatment. She is hopeful she will do well here Premorbid pt/family roles/activities: Sister, mom, retired, church member, Social research officer, government Anticipated changes in roles/activities/participation: resume Pt/family expectations/goals: Pt  states: " I want to be able to move around like I did before and be safe home alone."  Sister states: " I hope she does well there I have to work."  [employer]: Other (Comment) (SCAT) Premorbid Home Care/DME Agencies: Other (Comment) (has two power chairs, platform rw, bsc) Transportation available at discharge: Sister and SCAT  Discharge Planning Living Arrangements: Other relatives, Children Support Systems: Children, Other relatives, Friends/neighbors, Church/faith community Type of Residence: Private residence Insurance Resources: Multimedia programmer (specify) Chief Executive Officer) Financial Resources: Fish farm manager, Family Support Financial Screen Referred: No Living Expenses: Lives with family Money Management: Patient, Family Does the patient have any problems obtaining your medications?: No Home Management: Sister and pt assists with what she can do Magazine features editor Plans: Return home with sister and granddaughter, sister works during the day and pt will be alone while she is working. Her granddaughter is in 8th grade and will be starting school soon. Will await evaluations. Care Coordinator Barriers to Discharge: Decreased caregiver support, Insurance for SNF coverage Care Coordinator Anticipated Follow Up Needs: HH/OP  Clinical Impression Pleasant female who is willing to work and recover from this stroke. She has in the past and is hopeful she can again. She has equipment-power chair and rw from past stroke. Her sister can assist after 5;00 after work. Will await team's evaluations and work on discharge needs.  Elease Hashimoto 08/11/2021, 9:24 AM

## 2021-08-11 NOTE — Progress Notes (Signed)
Inpatient Rehabilitation  Patient information reviewed and entered into eRehab system by Osias Resnick M. Rochanda Harpham, M.A., CCC/SLP, PPS Coordinator.  Information including medical coding, functional ability and quality indicators will be reviewed and updated through discharge.    

## 2021-08-11 NOTE — Evaluation (Signed)
Occupational Therapy Assessment and Plan  Patient Details  Name: [name] MRN: [id number] Date of Birth: 12-May-1951  OT Diagnosis: acute pain, hemiplegia affecting non-dominant side, and muscle weakness (generalized) Rehab Potential: Rehab Potential (ACUTE ONLY): Good ELOS: 14-17 days   Today's Date: 08/11/2021 OT Individual Time: 0901-1005 OT Individual Time Calculation (min): 64 min     Hospital Problem: Principal Problem:   Left pontine cerebrovascular accident [facility])   Past Medical History:  Past Medical History:  Diagnosis Date   Abnormal echocardiogram    Anemia    Arthritis    BPPV (benign paroxysmal positional vertigo), right 06/13/2017   Bradycardia 03/07/2020   Cerebrovascular accident (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 ([place]) 10/29/2015   CHB (complete heart block) ([place]) 03/08/2020   CHF (congestive heart failure) ([place])    Closed fracture of left proximal humerus 07/08/2019   Decreased range of motion of left shoulder 09/03/2020   Depression    Dizzy 03/07/2020   bradycardia   Edentulous 09/03/2020   GERD (gastroesophageal reflux disease)    Hemiparesis affecting left side as late effect of cerebrovascular accident (CVA) ([place]) 03/08/2020   Hemorrhoids    History of Falls with injury 03/08/2020   Fall resulting in left humeral fracture   Hyperlipidemia    Hypertension    Hypothyroidism    Intracranial vascular stenosis    Lacunar infarction ([place])    Brain MRI 07/2021 multiple small remote pontine lacunar infarcts   Near syncope 03/08/2020   Paroxysmal atrial fibrillation (HCC)    Presence of permanent cardiac pacemaker    Proteinuria 03/08/2020   Seasonal allergies    Stroke ([place])    Thyroid cancer ([place])    per pt S/p Total Thyroidectomy with Radioactive Iodine Therapy   Type 1 diabetes mellitus ([place])    Past Surgical History:  Past Surgical History:  Procedure Laterality Date   ABDOMINAL ANGIOGRAM N/A 05/14/2012   Procedure:  ABDOMINAL ANGIOGRAM;  Surgeon: Laverda Page, MD;  Location: [facility] CATH LAB;  Service: Cardiovascular;  Laterality: N/A;   ABDOMINAL HYSTERECTOMY  1995   partial   ANKLE FRACTURE SURGERY Right    CARDIAC CATHETERIZATION     with coronary angiogram   cataract  Bilateral 2018   COLONOSCOPY N/A 09/09/2014   Procedure: COLONOSCOPY;  Surgeon: Gatha Mayer, MD;  Location: [facility];  Service: Endoscopy;  Laterality: N/A;   ESOPHAGOGASTRODUODENOSCOPY (EGD) WITH PROPOFOL N/A 12/03/2018   Procedure: ESOPHAGOGASTRODUODENOSCOPY (EGD) WITH PROPOFOL;  Surgeon: Doran Stabler, MD;  Location: [facility];  Service: Gastroenterology;  Laterality: N/A;   LEFT AND RIGHT HEART CATHETERIZATION WITH CORONARY ANGIOGRAM N/A 05/14/2012   Procedure: LEFT AND RIGHT HEART CATHETERIZATION WITH CORONARY ANGIOGRAM;  Surgeon: Laverda Page, MD;  Location: [facility] CATH LAB;  Service: Cardiovascular;  Laterality: N/A;   LOOP RECORDER INSERTION N/A 12/23/2018   Procedure: LOOP RECORDER INSERTION;  Surgeon: Constance Haw, MD;  Location: [place] CV LAB;  Service: Cardiovascular;  Laterality: N/A;   LOOP RECORDER REMOVAL N/A 06/25/2020   Procedure: LOOP RECORDER REMOVAL;  Surgeon: Constance Haw, MD;  Location: [place] CV LAB;  Service: Cardiovascular;  Laterality: N/A;   MINI METER   09/08/2014   ELECTRONIC INSULIN PUMP   PACEMAKER IMPLANT N/A 06/25/2020   Procedure: PACEMAKER IMPLANT;  Surgeon: Constance Haw, MD;  Location: [place] CV LAB;  Service: Cardiovascular;  Laterality: N/A;   TEE WITHOUT CARDIOVERSION N/A 10/16/2018   Procedure: TRANSESOPHAGEAL ECHOCARDIOGRAM (TEE);  Surgeon: Buford Dresser, MD;  Location: [facility] ENDOSCOPY;  Service: Cardiovascular;  Laterality: N/A;   THYROIDECTOMY  2006    Assessment & Plan Clinical Impression: Patient is a 70 y.o. year old female with recent admission to the hospital on 08/04/2021 with increasing left hemiparesis and worsening dysarthria.   CT/MRI showed small acute infarct in the left pons.  No significant edema or mass-effect.  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  Remote pontine microhemorrhage.  Patient transferred to CIR on 08/10/2021 .    Patient currently requires max with basic self-care skills secondary to muscle weakness, muscle joint tightness, and muscle paralysis, impaired timing and sequencing, abnormal tone, unbalanced muscle activation, ataxia, and decreased coordination, decreased awareness, and decreased standing balance, hemiplegia, and decreased balance strategies.  Prior to hospitalization, patient could complete ADLs with min.  Patient will benefit from skilled intervention to decrease level of assist with basic self-care skills and increase independence with basic self-care skills prior to discharge home with care partner.  Anticipate patient will require 24 hour supervision and follow up home health.  OT - End of Session Activity Tolerance: Tolerates 30+ min activity with multiple rests Endurance Deficit: Yes OT Assessment Rehab Potential (ACUTE ONLY): Good OT Barriers to Discharge: Decreased caregiver support OT Barriers to Discharge Comments: Pt is alone during the day when granddaughter is at school and sister is at work. OT Patient demonstrates impairments in the following area(s): Balance;Endurance;Motor;Sensory OT Basic ADL's Functional Problem(s): Grooming;Bathing;Dressing;Toileting OT Advanced ADL's Functional Problem(s): Simple Meal Preparation OT Transfers Functional Problem(s): Toilet OT Additional Impairment(s): Fuctional Use of Upper Extremity OT Plan OT Intensity: Minimum of 1-2 x/day, 45 to 90 minutes OT Frequency: 5 out of 7 days OT Duration/Estimated Length of Stay: 14-17 days OT Treatment/Interventions: Balance/vestibular training;Discharge planning;Pain management;Self  Care/advanced ADL retraining;Therapeutic Activities;UE/LE Coordination activities;Therapeutic Exercise;Patient/family education;Functional mobility training;Disease mangement/prevention;Cognitive remediation/compensation;Community reintegration;DME/adaptive equipment instruction;Neuromuscular re-education;UE/LE Strength taining/ROM;Psychosocial support;Wheelchair propulsion/positioning OT Self Feeding Anticipated Outcome(s): independent OT Basic Self-Care Anticipated Outcome(s): supervision OT Toileting Anticipated Outcome(s): supervision OT Bathroom Transfers Anticipated Outcome(s): supervision OT Recommendation Recommendations for Other Services: Therapeutic Recreation consult Therapeutic Recreation Interventions: Stress management Patient destination: Home Follow Up Recommendations: 24 hour supervision/assistance Equipment Recommended: None recommended by OT   OT Evaluation Precautions/Restrictions   Pain Pain Assessment Pain Scale: 0-10 Pain Score: 0-No pain Home Living/Prior Functioning Home Living Family/patient expects to be discharged to:: Private residence Living Arrangements: Other relatives, Children Available Help at Discharge: Family, Available PRN/intermittently (sister and granddaughter are not home 8-5 pm so pt is alone) Type of Home: House Home Access: Ramped entrance Home Layout: Two level, Able to live on main level with bedroom/bathroom Alternate Level Stairs-Number of Steps: stays on the main level Bathroom Shower/Tub:  (pt sponge bathes) Bathroom Toilet: Handicapped height Bathroom Accessibility: No (pt ambulates in with use of grab bars) Additional Comments: PF rollator  Lives With: Family Prior Function Level of Independence: Requires assistive device for independence (uses bedrail to get in/out of bed mod-I, intermittent assist to come to stand from chair otherwise can use lift recliner to come to stand (decreased amount of standing after breaking L arm  >2years ago))  Able to Take Stairs?: No (ramped entrance) Driving: No Comments: bought power w/c off [employer] for community mobility (lightweight), uses power w/c from [employer] in the home b/c it is heavier, primarily ambulates ~10steps using platform rollator to/from bathroom due to frequent falls with impaired balance Vision  Baseline Vision/History: Wears glasses Wears Glasses: Reading only Patient Visual Report: No change from baseline Vision Assessment?: Yes Eye Alignment: Within Functional Limits Ocular Range of Motion: Within Functional Limits Alignment/Gaze Preference: Within Defined Limits Tracking/Visual Pursuits: Decreased smoothness of horizontal tracking;Decreased smoothness of vertical tracking Saccades: Within functional limits Convergence: Within functional limits Visual Fields: No apparent deficits Perception  Perception: Within Functional Limits Praxis Praxis: Intact Cognition Overall Cognitive Status: Within Functional Limits for tasks assessed Arousal/Alertness: Awake/alert Orientation Level: Person;Place;Situation Person: Oriented Place: Oriented Situation: Oriented Year: 2022 Month: August Day of Week: Correct Memory: Appears intact Immediate Memory Recall: Sock;Blue;Bed Memory Recall Sock: Without Cue Memory Recall Blue: Without Cue Memory Recall Bed: Without Cue Attention: Focused;Sustained Focused Attention: Appears intact Sustained Attention: Appears intact Awareness: Impaired Awareness Impairment: Emergent impairment Problem Solving: Appears intact Safety/Judgment: Appears intact Comments: Pt running into objects with her power wheelchair, cueing to slow down the speed. Sensation Sensation Light Touch: Impaired Detail Peripheral sensation comments: hx of peripheral neuropathy - pt reports increased sensitivity to touch in B LEs Light Touch Impaired Details: Impaired LUE Hot/Cold: Appears Intact Proprioception: Appears Intact Stereognosis: Not  tested Additional Comments: Slight decreased light touch acuity in the LUE compared to the right but still able to detect with proprioception being WNLs. Coordination Gross Motor Movements are Fluid and Coordinated: No Fine Motor Movements are Fluid and Coordinated: No Coordination and Movement Description: Pt with decreased smoothness of movement and slight ataxia noted with LUE use, but she is able to integrate it functionally at a non-dominant level 9 Hole Peg Test: R= 39, L= 51 Motor  Motor Motor: Hemiplegia Motor - Skilled Clinical Observations: Pt with mild Left hemiparesis from previous CVA.  Trunk/Postural Assessment  Cervical Assessment Cervical Assessment: Exceptions to [facility] (forward head) Thoracic Assessment Thoracic Assessment: Exceptions to [facility] (rounded posture) Lumbar Assessment Lumbar Assessment: Exceptions to [facility] (posterior pelvic tilt)  Balance Balance Balance Assessed: Yes Static Sitting Balance Static Sitting - Balance Support: Feet supported Static Sitting - Level of Assistance: 5: Stand by assistance Dynamic Sitting Balance Dynamic Sitting - Balance Support: During functional activity Dynamic Sitting - Level of Assistance: 5: Stand by assistance Static Standing Balance Static Standing - Balance Support: During functional activity Static Standing - Level of Assistance: 4: Min assist Dynamic Standing Balance Dynamic Standing - Balance Support: During functional activity;Bilateral upper extremity supported Dynamic Standing - Level of Assistance: 3: Mod assist Extremity/Trunk Assessment RUE Assessment RUE Assessment: Within Functional Limits General Strength Comments: strength 4/5 throughout with full AROM in all joints LUE Assessment LUE Assessment: Exceptions to [facility] Passive Range of Motion (PROM) Comments: shoulder flexion 0-110 with history of left shoulder fx per her report Active Range of Motion (AROM) Comments: WFLS for elbow flexion/extension, wrist  flexion/extension and digit flexion/extension General Strength Comments: Strength 4/5 throoughout with decreased finger to nose speed compared to the right as well as slight ataxia.  Decreased FM coordination as well but uses at a diminshed level for selfcare tasks.  Care Tool Care Tool Self Care Eating   Eating Assist Level: Set up assist    Oral Care    Oral Care Assist Level: Set up assist    Bathing   Body parts bathed by patient: Right arm;Left arm;Chest;Abdomen;Right upper leg;Left upper leg;Face Body parts bathed by helper: Left lower leg;Right lower leg;Front perineal area;Buttocks   Assist Level: Maximal Assistance - Patient 24 - 49%    Upper Body Dressing(including orthotics)   What is the patient wearing?: Pull over shirt;Bra  Assist Level: Moderate Assistance - Patient 50 - 74%    Lower Body Dressing (excluding footwear)   What is the patient wearing?: Pants;Incontinence brief Assist for lower body dressing: Maximal Assistance - Patient 25 - 49%    Putting on/Taking off footwear   What is the patient wearing?: Ted hose;Shoes Assist for footwear: Maximal Assistance - Patient 25 - 49%       Care Tool Toileting Toileting activity   Assist for toileting: Maximal Assistance - Patient 25 - 49%     Care Tool Bed Mobility Roll left and right activity        Sit to lying activity        Lying to sitting edge of bed activity         Care Tool Transfers Sit to stand transfer   Sit to stand assist level: Moderate Assistance - Patient 50 - 74%    Chair/bed transfer   Chair/bed transfer assist level: Moderate Assistance - Patient 50 - 74%     Toilet transfer   Assist Level: Moderate Assistance - Patient 50 - 74%     Care Tool Cognition Expression of Ideas and Wants Expression of Ideas and Wants: Some difficulty - exhibits some difficulty with expressing needs and ideas (e.g, some words or finishing thoughts) or speech is not clear   Understanding Verbal and  Non-Verbal Content Understanding Verbal and Non-Verbal Content: Understands (complex and basic) - clear comprehension without cues or repetitions   Memory/Recall Ability *first 3 days only Memory/Recall Ability *first 3 days only: Staff names and faces;That he or she is in a hospital/hospital unit;Current season    Refer to Care Plan for Long Term Goals  SHORT TERM GOAL WEEK 1 OT Short Term Goal 1 (Week 1): Pt will complete UB dressing with supervision for two consecutive sessions. OT Short Term Goal 2 (Week 1): Pt will complete LB bathing with min assist sit to stand with use of AE PRN. OT Short Term Goal 3 (Week 1): Pt will complete LB dressing with min assist using AE PRN. OT Short Term Goal 4 (Week 1): Pt will complete toilet transfer with min assist using the RW and 3:1.  Recommendations for other services: Therapeutic Recreation  Stress management   Skilled Therapeutic Intervention ADL ADL Eating: Set up Where Assessed-Eating: Edge of bed Grooming: Supervision/safety Where Assessed-Grooming: Edge of bed Upper Body Bathing: Supervision/safety Where Assessed-Upper Body Bathing: Edge of bed Lower Body Bathing: Maximal assistance Where Assessed-Lower Body Bathing: Edge of bed Upper Body Dressing: Moderate assistance Where Assessed-Upper Body Dressing: Edge of bed Lower Body Dressing: Maximal assistance Where Assessed-Lower Body Dressing: Edge of bed Toileting: Maximal assistance Where Assessed-Toileting: Bedside Commode Toilet Transfer: Moderate assistance Toilet Transfer Method: Stand pivot Toilet Transfer Equipment: Extra wide Engineer, technical sales Transfer: Not assessed Social research officer, government: Not assessed Mobility  Transfers Sit to Stand: Moderate Assistance - Patient 50-74% Stand to Sit: Moderate Assistance - Patient 50-74%  Session Note:  Pt worked on bathing and dressing from the EOB.  She reports using a reacher at home to assist with threading her LB  clothing, however her granddaughter also would assist her with her socks and shoes... She was able to complete sit to stand with mod assist.  She was able to complete UB bathing with supervision with mod assist for donning a pullover shirt and sports bra.  Total assist for TEDs with max assist for tied up shoes.  She was able to complete stand pivot transfer to  the wheelchair with mod assist.  Increased ataxia noted in the LEs with stepping.  Finished session with pt in the wheelchair with safety belt in place and with the call button in her reach.    Session 2: [phone])  Pt operated her motorized scooter down to the therapy gym for session.  Mod instructional cueing to avoid going too fast. Slight difficulty noted trying to maneuver the chair next to the mat for transfer.  Mod assist for stand pivot transfer with use of the RW.  While siting had her complete Nine Hole Peg Test.  She was able to complete in 39 seconds with the right and 51 with the left.  Directions were given X 3 to complete task secondary ton pt picking up multiple pegs at at time on two occasions instead of one at a time.  Next had her work on sit to stand from the mat and short distance mobility from the mat to the chair with the RW.  Mod assist for sit to stand from the mat with decreased forward translation and timing of hip and knee extension.  Forward lean with increased trunk flexion noted with mobility, making it only approximately 5' before stating she was tired.  Noted wide slightly ataxic LE movements during mobility as well.  Returned to the wheelchair at [facility] assist level with transfer back to the room.  She was left with the call button in reach and safety belt in place.     Discharge Criteria: Patient will be discharged from OT if patient refuses treatment 3 consecutive times without medical reason, if treatment goals not met, if there is a change in medical status, if patient makes no progress towards goals or if patient is  discharged from hospital.  The above assessment, treatment plan, treatment alternatives and goals were discussed and mutually agreed upon: by patient  Brahim Dolman OTR/L 08/11/2021, 5:48 PM

## 2021-08-11 NOTE — Progress Notes (Signed)
[facility] Individual Statement of Services  Patient Name:  [name]  Date:  08/11/2021  Welcome to the [facility].  Our goal is to provide you with an individualized program based on your diagnosis and situation, designed to meet your specific needs.  With this comprehensive rehabilitation program, you will be expected to participate in at least 3 hours of rehabilitation therapies Monday-Friday, with modified therapy programming on the weekends.  Your rehabilitation program will include the following services:  Physical Therapy (PT), Occupational Therapy (OT), 24 hour per day rehabilitation nursing, Therapeutic Recreaction (TR), Care Coordinator, Rehabilitation Medicine, Nutrition Services, and Pharmacy Services  Weekly team conferences will be held on Wenesday to discuss your progress.  Your Inpatient Rehabilitation Care Coordinator will talk with you frequently to get your input and to update you on team discussions.  Team conferences with you and your family in attendance may also be held.  Expected length of stay: 2-2.5 weeks  Overall anticipated outcome: supervision level  Depending on your progress and recovery, your program may change. Your Inpatient Rehabilitation Care Coordinator will coordinate services and will keep you informed of any changes. Your Inpatient Rehabilitation Care Coordinator's name and contact numbers are listed  below.  The following services may also be recommended but are not provided by the [facility]:   [place] will be made to provide these services after discharge if needed.  Arrangements include referral to agencies that provide these services.  Your insurance has been verified to be:  [place] primary doctor is:  Consulting civil engineer  Pertinent information will be shared with your  doctor and your insurance company.  Inpatient Rehabilitation Care Coordinator:  Erlene Quan, [place] or [phone]  Information discussed with and copy given to patient by: Elease Hashimoto, 08/11/2021, 9:26 AM

## 2021-08-11 NOTE — Progress Notes (Signed)
Patient ID: [name], female   DOB: 1951-09-16, 70 y.o.   MRN: [id number] Met with the patient to introduce self and review the role of the nurse CM. Patient familiar with stroke and risk factors. Reviewed HLD (LDL 50) on Lipitor, DM (A1C 10.3); patient noted down from 11+ however higher than noted in chart for past two years. Reviewed insulin pump management and CMM/HH dietary modifications, HF and A-fib; Eliquis. Continue to follow along to discharge to address educational needs and collaborate with the SW to facilitate preparation for discharge. Patient admitted with specialized wheelchair, insulin pump and is asking for a hospital bed at discharge. [place] services provided by [facility] and has a ramped entry to home. Margarito Liner, RN

## 2021-08-12 DIAGNOSIS — I639 Cerebral infarction, unspecified: Secondary | ICD-10-CM | POA: Diagnosis not present

## 2021-08-12 LAB — GLUCOSE, CAPILLARY
Glucose-Capillary: 173 mg/dL — ABNORMAL HIGH (ref 70–99)
Glucose-Capillary: 198 mg/dL — ABNORMAL HIGH (ref 70–99)
Glucose-Capillary: 203 mg/dL — ABNORMAL HIGH (ref 70–99)
Glucose-Capillary: 172 mg/dL — ABNORMAL HIGH (ref 70–99)
Glucose-Capillary: 210 mg/dL — ABNORMAL HIGH (ref 70–99)

## 2021-08-12 NOTE — Progress Notes (Signed)
Occupational Therapy Session Note  Patient Details  Name: [name] MRN: [id number] Date of Birth: 11/15/1951  Today's Date: 08/12/2021 OT Individual Time: 1006-1101 OT Individual Time Calculation (min): 55 min    Short Term Goals: Week 1:  OT Short Term Goal 1 (Week 1): Pt will complete UB dressing with supervision for two consecutive sessions. OT Short Term Goal 2 (Week 1): Pt will complete LB bathing with min assist sit to stand with use of AE PRN. OT Short Term Goal 3 (Week 1): Pt will complete LB dressing with min assist using AE PRN. OT Short Term Goal 4 (Week 1): Pt will complete toilet transfer with min assist using the RW and 3:1.  Skilled Therapeutic Interventions/Progress Updates:    Pt worked on bathing and dressing from [place] during session.  She was able to complete UB bathing with supervision with UB dressing at mod assist.  She continues to demonstrate some difficulty with pulling items down in the back.  Increased left lean noted sitting EOB as well with bed being slightly slanted that way as well with inability to correct it.  No LOB noted by pt however.  She was able to use the LH sponge for washing her lower legs and feet with therapist providing total assist for donning knee high TEDs.  She was able to also use the reacher for threading her pants and helping to donn her shoes.  She needed mod assist for several sit to stands to remove clothing as well as for washing and pulling items up over the hips.  Finished session with transfer stand pivot to the power wheelchair with min assist stand pivot.  Call button and phone in reach and safety belt in place.    Therapy Documentation Precautions:  Precautions Precautions: Fall, Other (comment) Precaution Comments: watch gait belt placement - insulin pump Rt abdomen Restrictions Weight Bearing Restrictions: No   Pain: Pain Assessment Pain Scale: Faces Pain Score: 0-No pain ADL: See Care Tool Section for some  details of mobility and selfcare   Therapy/Group: Individual Therapy  Timmey Lamba OTR/L 08/12/2021, 12:21 PM

## 2021-08-12 NOTE — Progress Notes (Signed)
PROGRESS NOTE   Subjective/Complaints:  Discussed post CVA rec of avoiding hypoglycemia, appreciate diabetes coordinator note , has dx of Type 1 DM, follows with Dr Chalmers Cater at [facility]- neg CP, SOB, N/V/D   Objective:   No results found. Recent Labs    08/11/21 0456  WBC 4.5  HGB 11.4*  HCT 36.3  PLT 179    Recent Labs    08/11/21 0456  NA 137  K 4.1  CL 99  CO2 26  GLUCOSE 235*  BUN 17  CREATININE 1.26*  CALCIUM 9.4     Intake/Output Summary (Last 24 hours) at 08/12/2021 0839 Last data filed at 08/11/2021 1300 Gross per 24 hour  Intake 180 ml  Output --  Net 180 ml         Physical Exam: Vital Signs Blood pressure (!) 149/60, pulse 70, temperature 98.1 F (36.7 C), temperature source Oral, resp. rate 16, height '5\' 7"'$  (1.702 m), weight 121.7 kg, SpO2 96 %.  General: No acute distress Mood and affect are appropriate Heart: Regular rate and rhythm no rubs murmurs or extra sounds Lungs: Clear to auscultation, breathing unlabored, no rales or wheezes Abdomen: Positive bowel sounds, soft nontender to palpation, nondistended Extremities: No clubbing, cyanosis, or edema Skin: No evidence of breakdown, no evidence of rash  Neurologic: Cranial nerves II through XII intact, motor strength is 4/5 in bilateral deltoid, bicep, tricep, grip, hip flexor, knee extensors, ankle dorsiflexor and plantar flexor Sensory exam normal sensation to light touch and proprioception in bilateral upper and lower extremities Cerebellar exam normal finger to nose to finger as well as heel to shin in bilateral upper and lower extremities Musculoskeletal: Full range of motion in all 4 extremities. No joint swelling   Assessment/Plan: 1. Functional deficits which require 3+ hours per day of interdisciplinary therapy in a comprehensive inpatient rehab setting. Physiatrist is providing close team supervision and 24 hour management of  active medical problems listed below. Physiatrist and rehab team continue to assess barriers to discharge/monitor patient progress toward functional and medical goals  Care Tool:  Bathing    Body parts bathed by patient: Right arm, Left arm, Chest, Abdomen, Right upper leg, Left upper leg, Face   Body parts bathed by helper: Left lower leg, Right lower leg, Front perineal area, Buttocks     Bathing assist Assist Level: Maximal Assistance - Patient 24 - 49%     Upper Body Dressing/Undressing Upper body dressing   What is the patient wearing?: Pull over shirt, Bra    Upper body assist Assist Level: Moderate Assistance - Patient 50 - 74%    Lower Body Dressing/Undressing Lower body dressing      What is the patient wearing?: Pants, Incontinence brief     Lower body assist Assist for lower body dressing: Maximal Assistance - Patient 25 - 49%     Toileting Toileting    Toileting assist Assist for toileting: Maximal Assistance - Patient 25 - 49%     Transfers Chair/bed transfer  Transfers assist     Chair/bed transfer assist level: Maximal Assistance - Patient 25 - 49% Chair/bed transfer assistive device: Armrests, Programmer, multimedia  Ambulation assist      Assist level: Moderate Assistance - Patient 50 - 74% Assistive device: Walker-rolling Max distance: 1f   Walk 10 feet activity   Assist     Assist level: Moderate Assistance - Patient - 50 - 74% Assistive device: Walker-rolling   Walk 50 feet activity   Assist Walk 50 feet with 2 turns activity did not occur: Safety/medical concerns         Walk 150 feet activity   Assist Walk 150 feet activity did not occur: Safety/medical concerns         Walk 10 feet on uneven surface  activity   Assist Walk 10 feet on uneven surfaces activity did not occur: Safety/medical concerns         Wheelchair     Assist Will patient use wheelchair at discharge?: Yes Type of  Wheelchair: Power    Wheelchair assist level: Supervision/Verbal cueing, Set up assist Max wheelchair distance: 200    Wheelchair 50 feet with 2 turns activity    Assist        Assist Level: Supervision/Verbal cueing   Wheelchair 150 feet activity     Assist      Assist Level: Supervision/Verbal cueing   Blood pressure (!) 149/60, pulse 70, temperature 98.1 F (36.7 C), temperature source Oral, resp. rate 16, height '5\' 7"'$  (1.702 m), weight 121.7 kg, SpO2 96 %.  Medical Problem List and Plan: 1.  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  Antithrombotics: -DVT/anticoagulation: Eliquis Mechanical: Sequential compression devices, below knee Bilateral lower extremities Pharmaceutical: Lovenox             -antiplatelet therapy: N/A 3. Pain Management: Neurontin 100 mg 3 times daily             Monitor with increased exertion 4. Mood: Prozac 30 mg daily             -antipsychotic agents: N/A 5. Neuropsych: This patient is capable of making decisions on her own behalf. 6. Skin/Wound Care/W OC follow-up: Routine skin checks.  Left heel and right shin wounds 7. Fluids/Electrolytes/Nutrition: Routine in and outs             CMP ordered for tomorrow. 8.  Hypertension.  Norvasc 10 mg daily.               Monitor with increased mobility 9.  History of atrial fibrillation status post pacemaker.  Cardiac rate controlled.  Continue Eliquis 10.  Diabetes mellitus with peripheral neuropathy.  Patient does have an insulin pump that is being utilized          CBG (last 3)  Recent Labs    08/11/21 2125 08/12/21 0228 08/12/21 0616  GLUCAP 172* 173* 198*   Consult diabetic RN for insulin pump  management   11.  Diastolic congestive heart failure.  Demadex 10 mg daily.  Monitor for any signs of fluid overload      Filed Weights    08/08/21 0611 08/09/21 0604 08/10/21 0500  Weight: 123.4 kg 121.7 kg 121.9  kg    12.  Hyperlipidemia.  Lovaza/Lipitor 13.  Hypothyroidism.  Synthroid 14.  GERD.  Protonix 15.  Morbid obesity.  BMI 42.02.  Dietary follow-up.  Encourage weight loss 17.  OSA.  BiPAP    LOS: 2 days A FACE TO FACE EVALUATION WAS PERFORMED  Charlett Blake 08/12/2021, 8:39 AM

## 2021-08-12 NOTE — Progress Notes (Signed)
Physical Therapy Session Note  Patient Details  Name: [name] MRN: [id number] Date of Birth: March 20, 1951  Today's Date: 08/12/2021 PT Individual Time: [id number] PT Individual Time Calculation (min): 59 min and 34 min   Short Term Goals: Week 1:  PT Short Term Goal 1 (Week 1): Pt will perform supine<>sit using bedrail with min assist PT Short Term Goal 2 (Week 1): Pt will perform sit<>stands using LRAD with CGA PT Short Term Goal 3 (Week 1): Pt will perform bed<>chair transfers using LRAD with CGA PT Short Term Goal 4 (Week 1): Pt will ambulate at least 35f using LRAD with CGA  Skilled Therapeutic Interventions/Progress Updates:  Patient seated upright in personal power w/c on entrance to room. Patient alert and agreeable to PT session. Patient denied pain during session.  Therapeutic Activity: Bed Mobility: Patient performed supine <> sit with CGA on mat table. VC/ tc required for effort to bring LE to table surface. Rolling from side to side on mat table without rails for UE support required increased assist to Mod A for pt to complete. Instructions provided for segmental roll and push assist from contralateral LE. Mod A to both sides. Transfers: Patient performed STS and SPVT transfers CGA initially and requiring light Min A with fatigue by end of session. Provided verbal cues for technique and completion of pivot stepping during SPVT.  Wheelchair Mobility:  Pt unaware of ability to manage w/c at lower speeds with smaller movements into direction of travel on joystick. Educated pt on feature and demonstrated with hand over hand technique for proprioceptive learning. Patient propelled power wheelchair from room to main therapy gym close supervision. Provided vc for obstacles and to slow speed.  At end of session, pt guided in w/c mobility training with weaving of w/c activity through 5 cones in hallway placed 6 feet apart. Pt educated during activity of pivot  axis at rear wheel axle with improved turns around cones without touching cones on return pass through. Touched 3/ 5 cones mild to moderately on initial run through.   Neuromuscular Re-ed: NMR facilitated during session with focus on standing balance and motor control. Pt guided in PASS functional test. See results below. NMR performed for improvements in motor control and coordination, balance, sequencing, judgement, and self confidence/ efficacy in performing all aspects of mobility at highest level of independence.   Patient seated upright in w/c at end of session with brakes locked, belt alarm set, and all needs within reach. Tray table in front pt in set up for lunch.    Postural Assessment Scale for Stroke  Maintaining a posture  (3) Item 1: Sitting without support (sitting on the edge of a 50cm-high examination table with feet touching the floor)  0 = cannot sit 1 = can sit with slight support (e.g. by 1 hand) 2 = can sit for more than 10 seconds without support 3 = can sit for 5 minutes without support  (3) Item 2: Standing with support (feet position free, no other constraints)  0 = cannot stand, even with support 1 = can stand with strong support of 2 people 2 = can stand with moderate support of 1 person 3 = can stand with support on only 1 hand  (0) Item 3: Standing without support (feet position free, no other constraints)  0 = cannot stand without support 1 = can stand without support for 10 seconds or leans heavily on 1 leg 2 = can stand without support for  1 minute or stands slightly asymmetrically 3 = can stand without support for more than 1 minute and at the same time perform arm movements above the shoulder level  (0) Item 4: Standing on the nonparetic leg (no other constraints)  0 = cannot stand on the leg 1 = can stand on the leg for a few seconds 2 = can stand on the leg for more than 5 seconds 3 = can stand on the leg for more than 10 seconds  (0) Item  5: Standing on the paretic leg (no other restraints)  0 = cannot stand on the leg 1 = can stand on the leg for a few seconds 2 = can stand on the leg for more than 5 seconds 3 = can stand on the leg for more than 10 seconds  Changing Posture  (1) Item 6: Supine to affected side lateral  0 = cannot perform the activity 1 = can perform the activity with much help 2 = can perform the activity with little help 3 = can perform the activity without help  (1) Item 7: Supine to nonaffected side lateral  0 = cannot perform the activity 1 = can perform the activity with much help 2 = can perform the activity with little help 3 = can perform the activity without help  (3) Item 8: Supine to sitting up on edge of table  0 = cannot perform the activity 1 = can perform the activity with much help 2 = can perform the activity with little help 3 = can perform the activity without help  (2) Item 9: Sitting on edge of table to supine  0 = cannot perform the activity 1 = can perform the activity with much help 2 = can perform the activity with little help 3 = can perform the activity without help  (2) Item 10: Sit-to-stand (without any support, no other constraints)  0 = cannot perform the activity 1 = can perform the activity with much help 2 = can perform the activity with little help 3 = can perform the activity without help  (2) Item 11: Stand-to-sit (without any support, no other constraints)  0 = cannot perform the activity 1 = can perform the activity with much help 2 = can perform the activity with little help 3 = can perform the activity without help  (0) Item 12: Standing, picking up a pencil from the floor (without any support, no other constraints)  0 = cannot perform the activity 1 = can perform the activity with much help 2 = can perform the activity with little help 3 = can perform the activity without help  Total:   17  / 36      Session 2: Patient seated  upright in personal power w/c on entrance to room. Patient alert and agreeable to PT session. Pt with no pain complaint throughout session.  Therapeutic Activity: Transfers: Patient performed STS and SPVT transfers throughout session with CGA/ min A. Provided verbal cues for positioning, forward lean, and completing pivot stepping.  Gait Training:  Patient ambulated 20'+ x2 in completion of TUG test using RW with CGA. ACE wrap applied to LLE in order to decrease L toe drag with LLE advancement. Pt demos improved floor clearance over stepping quality during pivot transfers. Difficulty noted in rise to stand from w/c. TUG score = 61mn 50sec.   Wheelchair Mobility:  Patient propelled power wheelchair to/ from room to Day Room following instructions for changes in speed.  Pt with trouble in room with backing chair up closer to bed and requires several attempts with vc for improved steering in reverse.   Patient seated  in personal power w/c at end of session with power off, belt alarm set, and all needs within reach. Oriented to time and time of next therapy session.    Therapy Documentation Precautions:  Precautions Precautions: Fall, Other (comment) Precaution Comments: watch gait belt placement - insulin pump Rt abdomen Restrictions Weight Bearing Restrictions: No  Therapy/Group: Individual Therapy  Alger Simons PT, DPT 08/12/2021, 7:50 AM

## 2021-08-12 NOTE — Progress Notes (Signed)
Physical Therapy Session Note  Patient Details  Name: [name] MRN: [id number] Date of Birth: 03/03/1951  Today's Date: 08/12/2021 PT Individual Time: 1856-3149 PT Individual Time Calculation (min): 30 min   Short Term Goals: Week 1:  PT Short Term Goal 1 (Week 1): Pt will perform supine<>sit using bedrail with min assist PT Short Term Goal 2 (Week 1): Pt will perform sit<>stands using LRAD with CGA PT Short Term Goal 3 (Week 1): Pt will perform bed<>chair transfers using LRAD with CGA PT Short Term Goal 4 (Week 1): Pt will ambulate at least 22f using LRAD with CGA  Skilled Therapeutic Interventions/Progress Updates:   Pt received sitting in WC and agreeable to PT.   Power WC mobility 2 x 1565fto day room with supervision assist. PT required to perform backing for safety management.    Gait training wth RW 2 x 2521fith min-mod assist for safety in turns and AD management DF wrap to prevent toe drag but still noted to have adduction LLE.   Nustep reciprocal movement training x 6 min with short therapeutic rest break at 4 min. Cues for full ROM on the LLE to attain terminal kneee extension.  Patient returned to room and left sitting in [facility] in reach and all needs met.        Therapy Documentation Precautions:  Precautions Precautions: Fall, Other (comment) Precaution Comments: watch gait belt placement - insulin pump Rt abdomen Restrictions Weight Bearing Restrictions: No  Pain: denies   Therapy/Group: Individual Therapy  AusLorie Phenix19/2022, 6:26 PM

## 2021-08-12 NOTE — IPOC Note (Signed)
Overall Plan of Care [facility]) Patient Details Name: [name] MRN: [id number] DOB: 01-05-1951  Admitting Diagnosis: Left pontine cerebrovascular accident [facility])  Hospital Problems: Principal Problem:   Left pontine cerebrovascular accident [facility])     Functional Problem List: Nursing Bladder, Bowel, Pain, Endurance, Safety, Medication Management  PT Balance, Perception, Behavior, Safety, Edema, Sensory, Endurance, Skin Integrity, Motor, Nutrition, Pain  OT Balance, Endurance, Motor, Sensory  SLP    TR         Basic ADL's: OT Grooming, Bathing, Dressing, Toileting     Advanced  ADL's: OT Simple Meal Preparation     Transfers: PT Bed to Chair, Bed Mobility, Car, Chief Operating Officer: PT Ambulation, Emergency planning/management officer     Additional Impairments: OT Fuctional Use of Upper Extremity  SLP        TR      Anticipated Outcomes Item Anticipated Outcome  Self Feeding independent  Swallowing      Basic self-care  supervision  Toileting  supervision   Bathroom Transfers supervision  Bowel/Bladder  manage bowel w mod I and bladder w min assist  Transfers  supervision  Locomotion  supervision using LRAD  Communication     Cognition     Pain  at or below level 4  Safety/Judgment  maintain safety w cues/reminders   Therapy Plan: PT Intensity: Minimum of 1-2 x/day ,45 to 90 minutes PT Frequency: 5 out of 7 days PT Duration Estimated Length of Stay: 2-2.5 weeks OT Intensity: Minimum of 1-2 x/day, 45 to 90 minutes OT Frequency: 5 out of 7 days OT Duration/Estimated Length of Stay: 14-17 days     Due to the current state of emergency, patients may not be receiving their 3-hours of Medicare-mandated therapy.   Team Interventions: Nursing Interventions Bladder Management, Disease Management/Prevention, Medication Management, Discharge Planning, Pain Management, Bowel Management, Patient/Family Education  PT interventions  Ambulation/gait training, Community reintegration, DME/adaptive equipment instruction, Neuromuscular re-education, Psychosocial support, UE/LE Strength taining/ROM, Wheelchair propulsion/positioning, Training and development officer, Discharge planning, Functional electrical stimulation, Pain management, Skin care/wound management, Therapeutic Activities, UE/LE Coordination activities, Cognitive remediation/compensation, Disease management/prevention, Functional mobility training, Patient/family education, Splinting/orthotics, Therapeutic Exercise, Visual/perceptual remediation/compensation  OT Interventions Balance/vestibular training, Discharge planning, Pain management, Self Care/advanced ADL retraining, Therapeutic Activities, UE/LE Coordination activities, Therapeutic Exercise, Patient/family education, Functional mobility training, Disease mangement/prevention, Cognitive remediation/compensation, Academic librarian, Engineer, drilling, Neuromuscular re-education, UE/LE Strength taining/ROM, Psychosocial support, Wheelchair propulsion/positioning  SLP Interventions    TR Interventions    SW/CM Interventions Discharge Planning, Psychosocial Support, Patient/Family Education   Barriers to Discharge MD  Medical stability, Lack of/limited family support, and Weight  Nursing Decreased caregiver support 2 level main B+B, ramped entry w sister and grand daughter  PT Decreased caregiver support, Wound Care, Weight    OT Decreased caregiver support Pt is alone during the day when granddaughter is at school and sister is at work.  SLP      SW Decreased caregiver support, Insurance underwriter for SNF coverage     Team Discharge Planning: Destination: PT-Home ,OT- Home , SLP-  Projected Follow-up: PT-Home health PT, 24 hour supervision/assistance, OT-  24 hour supervision/assistance, SLP-  Projected Equipment Needs: PT-To be determined, OT- None recommended by OT, SLP-  Equipment Details: PT- ,  OT-  Patient/family involved in discharge planning: PT- Patient,  OT- , SLP-   MD ELOS: 17-20d Medical Rehab Prognosis:  Good Assessment: 70 year old right-handed female with history of diabetes mellitus with insulin pump, prior CVA  with residual left-sided weakness as well as dysarthria, atrial fibrillation maintained on Eliquis, diastolic congestive heart failure, history of complete heart block status post pacemaker 06/25/2020 per Dr. Curt Bears, OSA with CPAP, hypertension, hyperlipidemia, obesity, thyroid cancer with thyroidectomy and obesity with BMI 42.02.  History taken from chart review and patient. Patient lives with relatives.  Multilevel home bed and bath main level with ramped entrance.  Ambulates short distances with platform Rollator.  Granddaughter assist with set up for sponge bath and basic ADLs.  She presented on 08/04/2021 with increasing left hemiparesis and worsening dysarthria.  CT/MRI showed small acute infarct in the left pons.  No significant edema or mass-effect.  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  Remote pontine microhemorrhage.  Echocardiogram with ejection fraction of 60 to 65%, no wall motion abnormalities.  Admission chemistries unremarkable except glucose 152, creatinine 1.28, BNP 130.6.  Patient did not receive tPA.  Neurology follow-up patient currently remains on Eliquis as prior to admission.  Her insulin pump is being utilized for diabetes mellitus.  WOC follow-up 08/05/2021 for left heel and right shin wounds with skin care as directed.  Tolerating a regular consistency diet.  Therapy evaluations completed due to patient's left-sided hemiparesis and dysarthric speech was admitted for a comprehensive rehab program.  Please see preadmission assessment from earlier today as well.      See Team Conference Notes for weekly updates to the plan of care

## 2021-08-13 LAB — GLUCOSE, CAPILLARY
Glucose-Capillary: 176 mg/dL — ABNORMAL HIGH (ref 70–99)
Glucose-Capillary: 153 mg/dL — ABNORMAL HIGH (ref 70–99)
Glucose-Capillary: 242 mg/dL — ABNORMAL HIGH (ref 70–99)
Glucose-Capillary: 165 mg/dL — ABNORMAL HIGH (ref 70–99)
Glucose-Capillary: 310 mg/dL — ABNORMAL HIGH (ref 70–99)

## 2021-08-13 NOTE — Progress Notes (Signed)
Pt didn't void with morning BM, pt reports that only voids 2x a day. Last void was at 2100 approx, bladder scan pt for 107, discussed with charge, due to voiding history will allow additional time to void.

## 2021-08-13 NOTE — Progress Notes (Signed)
Physical Therapy Session Note  Patient Details  Name: [name] MRN: [id number] Date of Birth: 04/13/1951  Today's Date: 08/13/2021 PT Individual Time: 1130-1200 1405-1500 PT Individual Time Calculation (min): 30 min 6mn   Short Term Goals: Week 1:  PT Short Term Goal 1 (Week 1): Pt will perform supine<>sit using bedrail with min assist PT Short Term Goal 2 (Week 1): Pt will perform sit<>stands using LRAD with CGA PT Short Term Goal 3 (Week 1): Pt will perform bed<>chair transfers using LRAD with CGA PT Short Term Goal 4 (Week 1): Pt will ambulate at least 267fusing LRAD with CGA  Skilled Therapeutic Interventions/Progress Updates:  Session 1   Pt received sitting in WC and agreeable to PT. Power WC mobility through rehab unit 2 x 18069f Dynamic gait forward/reverse [id number] x 2 min assist overall with moderate cues for sequencing and AD management as well as instruction to prevent posterior LOB with use of ankle strategy to correct  Foot tap on 6 inch step x 8 BLE and to target on 6inch step x 5 Bil. Min assist from PT with min-mod cues for attention to the LLE.   Patient returned to room and [facility] reach and all needs met.     Session 2.   Pt received sitting in WC and agreeable to PT. ower WC mobility through rehab unit 2 x 180ft87fSit<>stand in parallel bars x 5 with supervision assist from Pt and cues for anterior weight shift.   Side stepping in parallel bars 8ft x64fbil with cues for improved erect posture, and improved position of BLE to prevent compensation through hip ER.   Cross body reach to obtain horse shoe from L parallel bar rail place on the R and then repeated with the LUE from Right to L. Performed x 2 with min assist form PT to facilitate increased trunkal rotation. 2nd bout with horse shoes on lateral support bar.   Stand pivot transfer to and from mat table with min assist overall with cues for improved  anterior weigh shift. Forward reach from thigh to bil shins x 6  Press up from mat table x 6 with cues for increased anterior weight shift to prevent posterior bias. Noted to have improved BLE positioning and decreased posterior LOB with increased repetitions. Hip abduction x 10 with level 1 tband, hip flexion x 10 reciprocally; min assist to prevent postioer bias and maintain neutral posture.    Kinetron BLE reciprocal movement training 4 x 30sec each with 1 min prese break between bouts.   Patient returned to room and [facility] and all needs met.         Therapy Documentation Precautions:  Precautions Precautions: Fall, Other (comment) Precaution Comments: watch gait belt placement - insulin pump Rt abdomen Restrictions Weight Bearing Restrictions: No  Pain:  denies    Therapy/Group: Individual Therapy  AustinLorie Phenix2022, 2:24 PM

## 2021-08-13 NOTE — Progress Notes (Signed)
Occupational Therapy Session Note  Patient Details  Name: [name] MRN: [id number] Date of Birth: Sep 29, 1951  Today's Date: 08/13/2021 OT Individual Time: 6269-4854 OT Individual Time Calculation (min): 31 min + 53 min   Short Term Goals: Week 1:  OT Short Term Goal 1 (Week 1): Pt will complete UB dressing with supervision for two consecutive sessions. OT Short Term Goal 2 (Week 1): Pt will complete LB bathing with min assist sit to stand with use of AE PRN. OT Short Term Goal 3 (Week 1): Pt will complete LB dressing with min assist using AE PRN. OT Short Term Goal 4 (Week 1): Pt will complete toilet transfer with min assist using the RW and 3:1.  Skilled Therapeutic Interventions/Progress Updates:    Session 1 (6270-3500): Pt received semi-reclined in bed, no c/o pain, agreeable to therapy. Session focus on self-care retraining, activity tolerance, func transfers in prep for improved ADL/IADL/func mobility performance + decreased caregiver burden. Pt req to sponge bathe. Came to sitting EOB with close S and increased time. Donned B shoes with close S + use of reacher + increased time. Completes UBB with close S for balance sitting EOB, doffed shirt/bra with close S. Donned bra with min A to pull in back and min A to thread LUE when donning jacket. STS with min A from elevated surface + RW. Able to bathe anterior periarea with CGA in standing, req mod A for thoroughness to bathe buttocks. Donned underwear/pants with mod A to thread BLE 2/2 time, pt able to pull over B hips. Redonned shoes/ teds total A 2/2 time constraints. Short amb transfer > power w/c with min A to power up and CGA for balance with use of RW.  Pt left seated in power w/c with safety belt alarm engaged, call bell in reach, and all immediate needs met.    Session 2 [phone]): Pt received seated in power w/c, no c/o pain, agreeable to therapy. Session focus on self-care retraining, activity tolerance,  generalized conditioning, STS in prep for improved ADL/IADL/func mobility performance + decreased caregiver burden. STS with light min A to power up, pivot to BSC with CGA + RW. Min A to pull pants down over L hip. Continent void of bladder. STS with min A to power up and stabilize RW. Completed pericare in standing with CGA, min A to pull pants up over L hip. Stand-pivot back to power w/c with CGA. Completed hand hygiene seated at sink with assistance to reach soap/towels. Self-propelled power w/c to and from gym with close S , able to appropriately monitor speed/navigate obstacles. Stood for ~ 2 min at high-low table with min A to power up / come out of posterior bias to complete peg design with 100% accuracy. Opted to use LUE to stabilize self and RUE to place pegs. Req to sit down 2/2 back pain/ fatigue. Stand-pivot > Nustep same manner as above. Completed 2 rounds of 3 min , then 2 min at 5/10 resistance. Reports 4/10 on mod RPE, but declines further activity 2/2 fatigue. Upon returning to power w/c, posterior LOB into chair 2/2 "legs giving out." Finally, completed 1x10 of the following with 1 lb dowel rod: forward/backward circles, chest press, B shoulder flexion and hold. Self-propelled self back to room and able to navigate parking next to bed with increased time and close S. Req tylenol for LLE pain, RN notified.  Pt left seated in power w/c with safety belt alarm engaged, call bell in reach, and  all immediate needs met.    Therapy Documentation Precautions:  Precautions Precautions: Fall, Other (comment) Precaution Comments: watch gait belt placement - insulin pump Rt abdomen Restrictions Weight Bearing Restrictions: No  Pain: see session notes   ADL: See Care Tool for more details.  Therapy/Group: Individual Therapy  Volanda Napoleon MS, OTR/L  08/13/2021, 6:51 AM

## 2021-08-14 DIAGNOSIS — I639 Cerebral infarction, unspecified: Secondary | ICD-10-CM | POA: Diagnosis not present

## 2021-08-14 LAB — GLUCOSE, CAPILLARY
Glucose-Capillary: 468 mg/dL — ABNORMAL HIGH (ref 70–99)
Glucose-Capillary: 512 mg/dL (ref 70–99)
Glucose-Capillary: 402 mg/dL — ABNORMAL HIGH (ref 70–99)
Glucose-Capillary: 290 mg/dL — ABNORMAL HIGH (ref 70–99)
Glucose-Capillary: 214 mg/dL — ABNORMAL HIGH (ref 70–99)
Glucose-Capillary: 230 mg/dL — ABNORMAL HIGH (ref 70–99)
Glucose-Capillary: 288 mg/dL — ABNORMAL HIGH (ref 70–99)
Glucose-Capillary: 384 mg/dL — ABNORMAL HIGH (ref 70–99)
Glucose-Capillary: 374 mg/dL — ABNORMAL HIGH (ref 70–99)
Glucose-Capillary: 346 mg/dL — ABNORMAL HIGH (ref 70–99)

## 2021-08-14 LAB — BETA-HYDROXYBUTYRIC ACID: Beta-Hydroxybutyric Acid: 2.83 mmol/L — ABNORMAL HIGH (ref 0.05–0.27)

## 2021-08-14 MED ORDER — INSULIN ASPART 100 UNIT/ML IJ SOLN
5.0000 [IU] | Freq: Once | INTRAMUSCULAR | Status: AC
  Administered 2021-08-14: 5 [IU] via SUBCUTANEOUS

## 2021-08-14 MED ORDER — INSULIN ASPART 100 UNIT/ML IJ SOLN
12.0000 [IU] | Freq: Once | INTRAMUSCULAR | Status: AC
  Administered 2021-08-14: 12 [IU] via SUBCUTANEOUS

## 2021-08-14 NOTE — Progress Notes (Signed)
CBG 402 Rechecked after 2 hrs of given 12 units of Novolog Subcutaneous. MD notified. New orders received from MD for 5 units Novolog Subcutaneous.

## 2021-08-14 NOTE — Progress Notes (Signed)
Occupational Therapy Session Note  Patient Details  Name: [name] MRN: [id number] Date of Birth: 04-27-1951  Today's Date: 08/15/2021 OT Individual Time: [id number] OT Individual Time Calculation (min): 41 min    Short Term Goals: Week 1:  OT Short Term Goal 1 (Week 1): Pt will complete UB dressing with supervision for two consecutive sessions. OT Short Term Goal 2 (Week 1): Pt will complete LB bathing with min assist sit to stand with use of AE PRN. OT Short Term Goal 3 (Week 1): Pt will complete LB dressing with min assist using AE PRN. OT Short Term Goal 4 (Week 1): Pt will complete toilet transfer with min assist using the RW and 3:1.  Skilled Therapeutic Interventions/Progress Updates:    Pt greeted while sitting EOB, requesting to engage in bathing/dressing tasks during session. When OT returned with needed items, pt had already doffed UB clothing. Supervision for UB bathing, cues to increase functional use of the affected Lt hand. Min A for UB dressing including bra, undershirt, and then Forensic scientist. Pt needed cues + assistance for pulling clothing down over Lt hemibody. Min A for sit<stand from elevated bed and Min balance assistance during frontal perihygiene using RW for standing support. OT assisted with posterior pericare due to small BM incontinence in underwear and ensuring thorough cleanliness. Pt opted to skip washing feet, instead used the reacher to don underwear + pants. Pt was encouraged to don brief or even use the pad with her underwear however pt refused. Max A for LB dressing mostly due to tightness of clothing fit. Advised having family bring in looser fitting clothing to increase her functional independence. She did use the reacher to assist OT. Pt then completed denture care while seated, integrating Lt UE to meet bimanual task demands without cues. Pt remained sitting EOB at close of session, left in care of RN to assist her in transferring to the  [facility]. Tx focus placed on ADL retraining, Lt NMR, sit<stands, and standing balance.   Therapy Documentation Precautions:  Precautions Precautions: Fall, Other (comment) Precaution Comments: watch gait belt placement - insulin pump Rt abdomen Restrictions Weight Bearing Restrictions: No  Pain: no c/o pain during tx Pain Assessment Pain Scale: 0-10 Pain Score: 0-No pain Faces Pain Scale: No hurt ADL: ADL Eating: Set up Where Assessed-Eating: Edge of bed Grooming: Supervision/safety Where Assessed-Grooming: Edge of bed Upper Body Bathing: Supervision/safety Where Assessed-Upper Body Bathing: Edge of bed Lower Body Bathing: Maximal assistance Where Assessed-Lower Body Bathing: Edge of bed Upper Body Dressing: Moderate assistance Where Assessed-Upper Body Dressing: Edge of bed Lower Body Dressing: Maximal assistance Where Assessed-Lower Body Dressing: Edge of bed Toileting: Maximal assistance Where Assessed-Toileting: Bedside Commode Toilet Transfer: Moderate assistance Toilet Transfer Method: Stand pivot Toilet Transfer Equipment: Extra wide Engineer, technical sales Transfer: Not assessed Social research officer, government: Not assessed     Therapy/Group: Individual Therapy  [name] 08/15/2021, 12:10 PM

## 2021-08-14 NOTE — Progress Notes (Addendum)
Pt blood sugar elevated again for dinner. Per pt monitor said there was a blockage (not witnessed by nurse) she changed line that administers insuline, checked blood sugar again. Says that not going to administer any more medication because she wants to allow insulin to work. Pt request to check blood sugar again in about 60mn to vfy that it is going down. Reviewed prev CBG not sure this is a new problem as blood sugars seem to remain around 200s. Will recheck at pt request.   Rechecked CBG with our monitor, has gone down 10 pts. Pt still refuses to administer any insulin at this time and insist on monitoring herself.

## 2021-08-14 NOTE — Progress Notes (Signed)
Patient blood sugars elevated to following values. 2116: CBG 310   0203: CBG 468     0237 CBG 512. Per protocol on insulin pump sidebar MD notified for hyperglycemic event protocol. New orders received for 12 Units of Novolog SubQ and repeat CBG check in 2 hours.

## 2021-08-14 NOTE — Progress Notes (Signed)
PROGRESS NOTE   Subjective/Complaints:  Insulin pump ran out of insulin last night.  Pharmacy has supplied and are and refilled. ROS- neg CP, SOB, N/V/D   Objective:   No results found. No results for input(s): WBC, HGB, HCT, PLT in the last 72 hours.  No results for input(s): NA, K, CL, CO2, GLUCOSE, BUN, CREATININE, CALCIUM in the last 72 hours.   Intake/Output Summary (Last 24 hours) at 08/14/2021 1032 Last data filed at 08/13/2021 1827 Gross per 24 hour  Intake 480 ml  Output --  Net 480 ml         Physical Exam: Vital Signs Blood pressure (!) 131/50, pulse 91, temperature 99.4 F (37.4 C), temperature source Oral, resp. rate 18, height '5\' 7"'$  (1.702 m), weight 121 kg, SpO2 97 %.   General: No acute distress Mood and affect are appropriate Heart: Regular rate and rhythm no rubs murmurs or extra sounds Lungs: Clear to auscultation, breathing unlabored, no rales or wheezes Abdomen: Positive bowel sounds, soft nontender to palpation, nondistended Extremities: No clubbing, cyanosis, or edema Skin: No evidence of breakdown, no evidence of rash   Neurologic: Cranial nerves II through XII intact, motor strength is 4/5 in bilateral deltoid, bicep, tricep, grip, hip flexor, knee extensors, ankle dorsiflexor and plantar flexor Sensory exam normal sensation to light touch and proprioception in bilateral upper and lower extremities Cerebellar exam normal finger to nose to finger as well as heel to shin in bilateral upper and lower extremities Musculoskeletal: Full range of motion in all 4 extremities. No joint swelling   Assessment/Plan: 1. Functional deficits which require 3+ hours per day of interdisciplinary therapy in a comprehensive inpatient rehab setting. Physiatrist is providing close team supervision and 24 hour management of active medical problems listed below. Physiatrist and rehab team continue to assess  barriers to discharge/monitor patient progress toward functional and medical goals  Care Tool:  Bathing    Body parts bathed by patient: Right arm, Left arm, Chest, Abdomen, Right upper leg, Left upper leg, Face, Left lower leg, Right lower leg, Front perineal area   Body parts bathed by helper: Left lower leg, Right lower leg, Front perineal area, Buttocks Body parts n/a: Front perineal area, Buttocks   Bathing assist Assist Level: Minimal Assistance - Patient > 75%     Upper Body Dressing/Undressing Upper body dressing   What is the patient wearing?: Pull over shirt, Bra    Upper body assist Assist Level: Minimal Assistance - Patient > 75%    Lower Body Dressing/Undressing Lower body dressing      What is the patient wearing?: Pants, Underwear/pull up     Lower body assist Assist for lower body dressing: Moderate Assistance - Patient 50 - 74%     Toileting Toileting    Toileting assist Assist for toileting: 2 Helpers     Transfers Chair/bed transfer  Transfers assist     Chair/bed transfer assist level: Minimal Assistance - Patient > 75% Chair/bed transfer assistive device: Museum/gallery exhibitions officer assist      Assist level: Contact Guard/Touching assist Assistive device: Walker-rolling Max distance: 42f   Walk 10 feet activity  Assist     Assist level: Contact Guard/Touching assist Assistive device: Walker-rolling   Walk 50 feet activity   Assist Walk 50 feet with 2 turns activity did not occur: Safety/medical concerns         Walk 150 feet activity   Assist Walk 150 feet activity did not occur: Safety/medical concerns         Walk 10 feet on uneven surface  activity   Assist Walk 10 feet on uneven surfaces activity did not occur: Safety/medical concerns         Wheelchair     Assist Will patient use wheelchair at discharge?: Yes Type of Wheelchair: Power    Wheelchair assist level:  Supervision/Verbal cueing, Set up assist Max wheelchair distance: 200    Wheelchair 50 feet with 2 turns activity    Assist        Assist Level: Supervision/Verbal cueing   Wheelchair 150 feet activity     Assist      Assist Level: Supervision/Verbal cueing   Blood pressure (!) 131/50, pulse 91, temperature 99.4 F (37.4 C), temperature source Oral, resp. rate 18, height '5\' 7"'$  (1.702 m), weight 121 kg, SpO2 97 %.  Medical Problem List and Plan: 1.  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  Antithrombotics: -DVT/anticoagulation: Eliquis Mechanical: Sequential compression devices, below knee Bilateral lower extremities Pharmaceutical: Lovenox             -antiplatelet therapy: N/A 3. Pain Management: Neurontin 100 mg 3 times daily             Monitor with increased exertion 4. Mood: Prozac 30 mg daily             -antipsychotic agents: N/A 5. Neuropsych: This patient is capable of making decisions on her own behalf. 6. Skin/Wound Care/W OC follow-up: Routine skin checks.  Left heel and right shin wounds 7. Fluids/Electrolytes/Nutrition: Routine in and outs             CMP ordered for tomorrow. 8.  Hypertension.  Norvasc 10 mg daily.               Monitor with increased mobility 9.  History of atrial fibrillation status post pacemaker.  Cardiac rate controlled.  Continue Eliquis 10.  Diabetes mellitus with peripheral neuropathy.  Patient does have an insulin pump that is being utilized          CBG (last 3)  Recent Labs    08/14/21 0455 08/14/21 0617 08/14/21 0747  GLUCAP 402* 290* 214*   Consult diabetic RN for insulin pump management , should improve today now that insulin  pump has been refilled with Novalog  11.  Diastolic congestive heart failure.  Demadex 10 mg daily.  Monitor for any signs of fluid overload      Filed Weights    08/08/21 0611 08/09/21 0604 08/10/21 0500  Weight: 123.4 kg 121.7 kg 121.9 kg    12.  Hyperlipidemia.  Lovaza/Lipitor 13.  Hypothyroidism.  Synthroid 14.  GERD.  Protonix 15.  Morbid obesity.  BMI 42.02.  Dietary follow-up.  Encourage weight loss 17.  OSA.  BiPAP    LOS: 4 days A FACE TO FACE EVALUATION WAS PERFORMED  [name] 08/14/2021, 10:32 AM

## 2021-08-14 NOTE — Progress Notes (Signed)
CPAP is set up at bedside. Patient stated she can place mask on herself without assistance. RT will monitor as needed.

## 2021-08-14 NOTE — Progress Notes (Addendum)
CBG at 0747 is 214, now its 228. Vfy that new insulin bottle was not changed last night, only changed the site last night. Pt did not eat much breakfast. Left message for Dr Letta Pate to call back.   Talked to Dr Letta Pate, will see if pharmacy can order for pt. Called pharmacy and she informed to order bottle from floor stock and give to pt.   Received bottle of Novolog insulin, pt loaded insulin pump with assistance. Insulin administered via pump. Noted in MAR. Labeled insulin and placed in pts medication bin with date opened. Will need to be reloaded in 3 or 4 days per pt.

## 2021-08-15 DIAGNOSIS — I639 Cerebral infarction, unspecified: Secondary | ICD-10-CM | POA: Diagnosis not present

## 2021-08-15 LAB — GLUCOSE, CAPILLARY
Glucose-Capillary: 296 mg/dL — ABNORMAL HIGH (ref 70–99)
Glucose-Capillary: 207 mg/dL — ABNORMAL HIGH (ref 70–99)
Glucose-Capillary: 193 mg/dL — ABNORMAL HIGH (ref 70–99)
Glucose-Capillary: 205 mg/dL — ABNORMAL HIGH (ref 70–99)
Glucose-Capillary: 258 mg/dL — ABNORMAL HIGH (ref 70–99)

## 2021-08-15 NOTE — Progress Notes (Signed)
Physical Therapy Session Note  Patient Details  Name: [name] MRN: [id number] Date of Birth: [birth year]  Today's Date: 08/15/2021 PT Individual Time: 1036-1100 and [id number] PT Individual Time Calculation (min): 24 min and 25 min  Short Term Goals: Week 1:  PT Short Term Goal 1 (Week 1): Pt will perform supine<>sit using bedrail with min assist PT Short Term Goal 2 (Week 1): Pt will perform sit<>stands using LRAD with CGA PT Short Term Goal 3 (Week 1): Pt will perform bed<>chair transfers using LRAD with CGA PT Short Term Goal 4 (Week 1): Pt will ambulate at least 36f using LRAD with CGA  Skilled Therapeutic Interventions/Progress Updates:    Session 1: Pt received sitting in her personal power w/c and agreeable to therapy session. Power w/c mobility ~[id number] to/from main therapy gym with supervision and pt demonstrating improved control over speed of wheelchair for increased safety indoors especially when turning through doorways. Sit<>stands w/c<>RW with pt pushing up with B UEs from w/c armrests when coming to stand with min assist. Gait training ~[id number] using RW with min progressing towards mod assist at end of 2nd walk due to fatigue resulting in worsening anterior lean and therapist having to prevent forward movement of AD to prevent anterior LOB - pt continues to demo wide BOS with uncoordinated B LE movements in a more rigid/stiff pattern with excessive anterior trunk lean resulting in pt ambulating on toes that worsens with fatigue - intermittent decreased step lengths with inconsistent LEs (R>L) with cuing for reciprocal pattern. When turning to sit at end of gait training required cuing for sequencing AD and LE stepping to turn fully. At end of session, pt left seated in personal w/c with needs in reach.   Session 2: Pt received sitting in her personal power wheelchair and is agreeable to therapy session. Power w/c mobility ~[id number] to/from main therapy gym with pt  continuing to demonstrate improved control over wheelchair speed and increased safety navigating through doorways - at end of session when returning to room pt demos improved ability to back up chair next to the bed with only 1 instance of bumping wheelchair into the bed. Educated pt on importance of safety when driving chair to ensure she does not bump her legs and cause an injury (due to her hx of diabetes and slower healing time). Sit>stand w/c>RW with min assist for lifting to stand and balance with pt able to push up with B UEs on w/c armrests to assist with coming to stand. Sit>stand from EOM>RW with heavy mod assist for lifting to stand as pt does not have armrests and demonstrates poor ability to flex trunk and hip/knees to shift weight forward and lift hips up from mat and then transition into the extension phase of standing. Gait training [id number] RW with min progressed to heavy mod assist at end of 1st walk due to fatigue causing strong L anterior trunk lean requiring heavy mod assist to maintain upright - pt continues to demo above gait impairments of wide BOS, anterior weight shift with pt ambulating on her toes, decreased R LE step length (resulting from poor ability to weight shift L while not having L anterior LOB) - cuing throughout for improvement. Pt also noted to have decreased knee flexion ROM sitting EOM therefore performed quad stretch x1 minute. At end of session, pt left seated in power wheelchair with seat belt alarm on, needs in reach, meal tray set-up, and nurse present.  Therapy  Documentation Precautions:  Precautions Precautions: Fall, Other (comment) Precaution Comments: watch gait belt placement - insulin pump Rt abdomen Restrictions Weight Bearing Restrictions: No   Pain:  Session 1: No reports of pain throughout session.  Session 2: Denies pain during session.   Therapy/Group: Individual Therapy  Tawana Scale , PT, DPT, NCS, CSRS 08/15/2021, 7:57 AM

## 2021-08-15 NOTE — Progress Notes (Signed)
Occupational Therapy Session Note  Patient Details  Name: Rosine Langi MRN: [id number] Date of Birth: 1951-04-05  Today's Date: 08/15/2021 OT Individual Time: [id number] OT Individual Time Calculation (min): 41 min    Short Term Goals: Week 1:  OT Short Term Goal 1 (Week 1): Pt will complete UB dressing with supervision for two consecutive sessions. OT Short Term Goal 2 (Week 1): Pt will complete LB bathing with min assist sit to stand with use of AE PRN. OT Short Term Goal 3 (Week 1): Pt will complete LB dressing with min assist using AE PRN. OT Short Term Goal 4 (Week 1): Pt will complete toilet transfer with min assist using the RW and 3:1.  Skilled Therapeutic Interventions/Progress Updates:    Pt worked on AE education and use from the power wheelchair for doffing shoes and socks and donning them.  Educated pt on use of the sockaide for donning her regular socks.  Therapist assisted with doffing TEDS but she was able to use the reacher for doffing her socks.  She was able to use the sockaide with min demonstrational cueing for completion.  She used the reacher for donning her shoes with demonstration of LH shoe horn, however she was not successful with trial.  Feel she would do better with shoe funnel, which was not available to try.  Supervision with increased time for shoes, with back of the left shoe getting slightly folded under.  She was able to correct with increased time and multiple trials.  Call button and phone in reach at end of session with safety alarm belt in place.    Therapy Documentation Precautions:  Precautions Precautions: Fall, Other (comment) Precaution Comments: watch gait belt placement - insulin pump Rt abdomen Restrictions Weight Bearing Restrictions: No  Pain: Pain Assessment Pain Scale: Faces Pain Score: 0-No pain ADL: See Care Tool Section for some details of mobility and selfcare   Therapy/Group: Individual Therapy  Justyna Timoney  OTR/L 08/15/2021, 4:11 PM

## 2021-08-15 NOTE — Progress Notes (Signed)
Occupational Therapy Session Note  Patient Details  Name: [name] MRN: [id number] Date of Birth: [birth year]  Today's Date: 08/15/2021 OT Individual Time: 1301-1330 OT Individual Time Calculation (min): 29 min    Short Term Goals: Week 1:  OT Short Term Goal 1 (Week 1): Pt will complete UB dressing with supervision for two consecutive sessions. OT Short Term Goal 2 (Week 1): Pt will complete LB bathing with min assist sit to stand with use of AE PRN. OT Short Term Goal 3 (Week 1): Pt will complete LB dressing with min assist using AE PRN. OT Short Term Goal 4 (Week 1): Pt will complete toilet transfer with min assist using the RW and 3:1.  Skilled Therapeutic Interventions/Progress Updates:    Pt greeted seated in power scooter and agreeable to OT treatment session. Pt drove wc down to therapy gym with supervision. Using 1 lb dowel rod, pt participated in B UE volley toss with beach ball. Continued with UB there-ex with bicep curl, chest press, and straight arm raise using dowel rod. Pt propelled wc back to room and left seated in power scooter with needs met.   Therapy Documentation Precautions:  Precautions Precautions: Fall, Other (comment) Precaution Comments: watch gait belt placement - insulin pump Rt abdomen Restrictions Weight Bearing Restrictions: No  Pain: Pain Assessment Pain Scale: 0-10 Pain Score: 0-No pain Faces Pain Scale: No hurt   Therapy/Group: Individual Therapy  Valma Cava 08/15/2021, 1:29 PM

## 2021-08-15 NOTE — Progress Notes (Signed)
RT NOTE:  Pt agrees that she can manage auto BIPAP when ready for bed. Pt understands RT is available if needed.

## 2021-08-15 NOTE — Progress Notes (Signed)
PROGRESS NOTE   Subjective/Complaints:  Pt without new issues, CBGs elevated pt with pump basal rate at 0.9 U per hour, has given herself several boluses of 1.5 Units since yesterday and still running in 200s  ROS- neg CP, SOB, N/V/D   Objective:   No results found. No results for input(s): WBC, HGB, HCT, PLT in the last 72 hours.  No results for input(s): NA, K, CL, CO2, GLUCOSE, BUN, CREATININE, CALCIUM in the last 72 hours.   Intake/Output Summary (Last 24 hours) at 08/15/2021 0834 Last data filed at 08/15/2021 0740 Gross per 24 hour  Intake 365 ml  Output 800 ml  Net -435 ml         Physical Exam: Vital Signs Blood pressure 97/65, pulse 70, temperature 98.4 F (36.9 C), resp. rate 16, height '5\' 7"'$  (1.702 m), weight 122 kg, SpO2 94 %.  General: No acute distress Mood and affect are appropriate Heart: Regular rate and rhythm no rubs murmurs or extra sounds Lungs: Clear to auscultation, breathing unlabored, no rales or wheezes Abdomen: Positive bowel sounds, soft nontender to palpation, nondistended Extremities: No clubbing, cyanosis, or edema Skin: No evidence of breakdown, no evidence of rash  Neurologic: Cranial nerves II through XII intact, motor strength is 4/5 in bilateral deltoid, bicep, tricep, grip, hip flexor, knee extensors, ankle dorsiflexor and plantar flexor Sensory exam normal sensation to light touch and proprioception in bilateral upper and lower extremities Cerebellar exam normal finger to nose to finger as well as heel to shin in bilateral upper and lower extremities Musculoskeletal: Full range of motion in all 4 extremities. No joint swelling   Assessment/Plan: 1. Functional deficits which require 3+ hours per day of interdisciplinary therapy in a comprehensive inpatient rehab setting. Physiatrist is providing close team supervision and 24 hour management of active medical problems listed  below. Physiatrist and rehab team continue to assess barriers to discharge/monitor patient progress toward functional and medical goals  Care Tool:  Bathing    Body parts bathed by patient: Right arm, Left arm, Chest, Abdomen, Right upper leg, Left upper leg, Face, Left lower leg, Right lower leg, Front perineal area   Body parts bathed by helper: Left lower leg, Right lower leg, Front perineal area, Buttocks Body parts n/a: Front perineal area, Buttocks   Bathing assist Assist Level: Minimal Assistance - Patient > 75%     Upper Body Dressing/Undressing Upper body dressing   What is the patient wearing?: Pull over shirt, Bra    Upper body assist Assist Level: Minimal Assistance - Patient > 75%    Lower Body Dressing/Undressing Lower body dressing      What is the patient wearing?: Pants, Underwear/pull up     Lower body assist Assist for lower body dressing: Moderate Assistance - Patient 50 - 74%     Toileting Toileting    Toileting assist Assist for toileting: 2 Helpers     Transfers Chair/bed transfer  Transfers assist     Chair/bed transfer assist level: Minimal Assistance - Patient > 75% Chair/bed transfer assistive device: Programmer, multimedia   Ambulation assist      Assist level: Contact Guard/Touching assist Assistive  device: Walker-rolling Max distance: 49f   Walk 10 feet activity   Assist     Assist level: Contact Guard/Touching assist Assistive device: Walker-rolling   Walk 50 feet activity   Assist Walk 50 feet with 2 turns activity did not occur: Safety/medical concerns         Walk 150 feet activity   Assist Walk 150 feet activity did not occur: Safety/medical concerns         Walk 10 feet on uneven surface  activity   Assist Walk 10 feet on uneven surfaces activity did not occur: Safety/medical concerns         Wheelchair     Assist Is the patient using a wheelchair?: Yes Type of Wheelchair:  Power    Wheelchair assist level: Supervision/Verbal cueing, Set up assist Max wheelchair distance: 200    Wheelchair 50 feet with 2 turns activity    Assist        Assist Level: Supervision/Verbal cueing   Wheelchair 150 feet activity     Assist      Assist Level: Supervision/Verbal cueing   Blood pressure 97/65, pulse 70, temperature 98.4 F (36.9 C), resp. rate 16, height '5\' 7"'$  (1.702 m), weight 122 kg, SpO2 94 %.  Medical Problem List and Plan: 1.  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  Antithrombotics: -DVT/anticoagulation: Eliquis Mechanical: Sequential compression devices, below knee Bilateral lower extremities Pharmaceutical: Lovenox             -antiplatelet therapy: N/A 3. Pain Management: Neurontin 100 mg 3 times daily             Monitor with increased exertion 4. Mood: Prozac 30 mg daily             -antipsychotic agents: N/A 5. Neuropsych: This patient is capable of making decisions on her own behalf. 6. Skin/Wound Care/W OC follow-up: Routine skin checks.  Left heel and right shin wounds 7. Fluids/Electrolytes/Nutrition: Routine in and outs             CMP ordered for tomorrow. 8.  Hypertension.  Norvasc 10 mg daily.         Vitals:   08/15/21 0450 08/15/21 0451  BP: 97/65   Pulse: 70 70  Resp: 16   Temp: 98.4 F (36.9 C)   SpO2: (!) 85% 94%    9.  History of atrial fibrillation status post pacemaker.  Cardiac rate controlled.  Continue Eliquis 10.  Diabetes mellitus with peripheral neuropathy.  Patient does have an insulin pump that is being utilized          CBG (last 3)  Recent Labs    08/14/21 2059 08/15/21 0212 08/15/21 0547  GLUCAP 346*  296* 207*   Elevated diabetes coordinator to assist with pump settings Advised pt to increase basal rate to 1.5 U per hr, pt has done this   11.  Diastolic congestive heart failure.  Demadex 10 mg daily.  Monitor for any signs of fluid overload      FAutoliv  08/08/21 0611 08/09/21 0604 08/10/21 0500  Weight: 123.4 kg 121.7 kg 121.9 kg    12.  Hyperlipidemia.  Lovaza/Lipitor 13.  Hypothyroidism.  Synthroid 14.  GERD.  Protonix 15.  Morbid obesity.  BMI 42.02.  Dietary follow-up.  Encourage weight loss 17.  OSA.  BiPAP    LOS: 5 days A FACE TO FACE EVALUATION WAS PERFORMED  Charlett Blake 08/15/2021, 8:34 AM

## 2021-08-16 LAB — GLUCOSE, CAPILLARY
Glucose-Capillary: 165 mg/dL — ABNORMAL HIGH (ref 70–99)
Glucose-Capillary: 166 mg/dL — ABNORMAL HIGH (ref 70–99)
Glucose-Capillary: 210 mg/dL — ABNORMAL HIGH (ref 70–99)
Glucose-Capillary: 122 mg/dL — ABNORMAL HIGH (ref 70–99)
Glucose-Capillary: 208 mg/dL — ABNORMAL HIGH (ref 70–99)

## 2021-08-16 NOTE — Progress Notes (Signed)
PROGRESS NOTE   Subjective/Complaints:  No issues overnite discussed CBG, needs to use commode, requires 2 person assist   ROS- neg CP, SOB, N/V/D   Objective:   No results found. No results for input(s): WBC, HGB, HCT, PLT in the last 72 hours.  No results for input(s): NA, K, CL, CO2, GLUCOSE, BUN, CREATININE, CALCIUM in the last 72 hours.   Intake/Output Summary (Last 24 hours) at 08/16/2021 [id number] Last data filed at 08/15/2021 1301 Gross per 24 hour  Intake 80 ml  Output --  Net 80 ml      Pressure Injury 08/10/21 Heel Left;Lateral Unstageable - Full thickness tissue loss in which the base of the injury is covered by slough (yellow, tan, gray, green or brown) and/or eschar (tan, brown or black) in the wound bed. (Active)  08/10/21   Location: Heel  Location Orientation: Left;Lateral  Staging: Unstageable - Full thickness tissue loss in which the base of the injury is covered by slough (yellow, tan, gray, green or brown) and/or eschar (tan, brown or black) in the wound bed.  Wound Description (Comments):   Present on Admission: Yes    Physical Exam: Vital Signs Blood pressure (!) 152/59, pulse 71, temperature 97.9 F (36.6 C), temperature source Oral, resp. rate 18, height '5\' 7"'$  (1.702 m), weight 117.7 kg, SpO2 94 %.   General: No acute distress Mood and affect are appropriate Heart: Regular rate and rhythm no rubs murmurs or extra sounds Lungs: Clear to auscultation, breathing unlabored, no rales or wheezes Abdomen: Positive bowel sounds, soft nontender to palpation, nondistended Extremities: No clubbing, cyanosis, or edema Skin: No evidence of breakdown, no evidence of rash Neurologic: Cranial nerves II through XII intact, motor strength is 4/5 in bilateral deltoid, bicep, tricep, grip, hip flexor, knee extensors, ankle dorsiflexor and plantar flexor Sensory exam normal sensation to light touch and  proprioception in bilateral upper and lower extremities Cerebellar exam normal finger to nose to finger as well as heel to shin in bilateral upper and lower extremities Musculoskeletal: Full range of motion in all 4 extremities. No joint swelling   Assessment/Plan: 1. Functional deficits which require 3+ hours per day of interdisciplinary therapy in a comprehensive inpatient rehab setting. Physiatrist is providing close team supervision and 24 hour management of active medical problems listed below. Physiatrist and rehab team continue to assess barriers to discharge/monitor patient progress toward functional and medical goals  Care Tool:  Bathing    Body parts bathed by patient: Right arm, Left arm, Chest, Abdomen, Right upper leg, Left upper leg, Face, Left lower leg, Right lower leg, Front perineal area   Body parts bathed by helper: Left lower leg, Right lower leg, Front perineal area, Buttocks Body parts n/a: Front perineal area, Buttocks   Bathing assist Assist Level: Minimal Assistance - Patient > 75%     Upper Body Dressing/Undressing Upper body dressing   What is the patient wearing?: Pull over shirt, Bra    Upper body assist Assist Level: Minimal Assistance - Patient > 75%    Lower Body Dressing/Undressing Lower body dressing      What is the patient wearing?: Pants, Underwear/pull up  Lower body assist Assist for lower body dressing: Moderate Assistance - Patient 50 - 74%     Toileting Toileting    Toileting assist Assist for toileting: 2 Helpers     Transfers Chair/bed transfer  Transfers assist     Chair/bed transfer assist level: Moderate Assistance - Patient 50 - 74% Chair/bed transfer assistive device: Programmer, multimedia   Ambulation assist      Assist level: Moderate Assistance - Patient 50 - 74% Assistive device: Walker-rolling Max distance: 72f   Walk 10 feet activity   Assist     Assist level: Minimal Assistance  - Patient > 75% Assistive device: Walker-rolling   Walk 50 feet activity   Assist Walk 50 feet with 2 turns activity did not occur: Safety/medical concerns  Assist level: Moderate Assistance - Patient - 50 - 74% Assistive device: Walker-rolling    Walk 150 feet activity   Assist Walk 150 feet activity did not occur: Safety/medical concerns         Walk 10 feet on uneven surface  activity   Assist Walk 10 feet on uneven surfaces activity did not occur: Safety/medical concerns         Wheelchair     Assist Is the patient using a wheelchair?: Yes Type of Wheelchair: Power    Wheelchair assist level: Supervision/Verbal cueing, Set up assist Max wheelchair distance: 2057f   Wheelchair 50 feet with 2 turns activity    Assist        Assist Level: Supervision/Verbal cueing   Wheelchair 150 feet activity     Assist      Assist Level: Supervision/Verbal cueing   Blood pressure (!) 152/59, pulse 71, temperature 97.9 F (36.6 C), temperature source Oral, resp. rate 18, height '5\' 7"'$  (1.702 m), weight 117.7 kg, SpO2 94 %.  Medical Problem List and Plan: 1.  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  Antithrombotics: -DVT/anticoagulation: Eliquis Mechanical: Sequential compression devices, below knee Bilateral lower extremities Pharmaceutical: Lovenox             -antiplatelet therapy: N/A 3. Pain Management: Neurontin 100 mg 3 times daily             Monitor with increased exertion 4. Mood: Prozac 30 mg daily             -antipsychotic agents: N/A 5. Neuropsych: This patient is capable of making decisions on her own behalf. 6. Skin/Wound  Care/W OC follow-up: Routine skin checks.  Left heel and right shin wounds 7. Fluids/Electrolytes/Nutrition: Routine in and outs             CMP ordered for tomorrow. 8.  Hypertension.  Norvasc 10 mg daily.         Vitals:   08/15/21 1920 08/16/21 0340  BP: (!) 141/93 (!) 152/59  Pulse: 70 71  Resp: 18 18  Temp: 98.1 F (36.7 C) 97.9 F (36.6 C)  SpO2: 96% 94%    Elevated this am consider ACE I if this  persists 9.  History of atrial fibrillation status post pacemaker.  Cardiac rate controlled.  Continue Eliquis 10.  Diabetes mellitus with peripheral neuropathy.  Patient does have an insulin pump that is being utilized          CBG (last 3)  Recent Labs    08/15/21 2111 08/16/21 0202 08/16/21 0602  GLUCAP 258* 165* 166*   Elevated diabetes coordinator to assist with pump settings Advised pt to increase basal rate to 1.5 U per hr, pt has done this - improving - has reduced basal rate at night to avoid hypoglycemia   11.  Diastolic congestive heart failure.  Demadex 10 mg daily.  Monitor for any signs of fluid overload      Filed Weights    08/08/21 0611 08/09/21 0604 08/10/21 0500  Weight: 123.4 kg 121.7 kg 121.9 kg    12.  Hyperlipidemia.  Lovaza/Lipitor 13.  Hypothyroidism.  Synthroid 14.  GERD.  Protonix 15.  Morbid obesity.  BMI 42.02.  Dietary follow-up.  Encourage weight loss 17.  OSA.  BiPAP    LOS: 6 days A FACE TO FACE EVALUATION WAS PERFORMED  Charlett Blake 08/16/2021, 8:26 AM

## 2021-08-16 NOTE — Progress Notes (Signed)
Physical Therapy Session Note  Patient Details  Name: [name] MRN: [id number] Date of Birth: October 14, 1951  Today's Date: 08/16/2021 PT Individual Time: 0917-1030; 1130-1200 PT Individual Time Calculation (min): 73 min; 30 mins  Short Term Goals: Week 1:  PT Short Term Goal 1 (Week 1): Pt will perform supine<>sit using bedrail with min assist PT Short Term Goal 2 (Week 1): Pt will perform sit<>stands using LRAD with CGA PT Short Term Goal 3 (Week 1): Pt will perform bed<>chair transfers using LRAD with CGA PT Short Term Goal 4 (Week 1): Pt will ambulate at least 75f using LRAD with CGA  Skilled Therapeutic Interventions/Progress Updates:  Session One:  Pt in bed and agreeable to therapy on entry.  Supine > EOB with MinA to assist with trunk transition. Once EOB pt requesting to change clothes/wash up. Pt maintained good seated dynamic balance with BLE unsupported while self completing upper and lower body hygiene with washcloth. Pt able to manage upper body dressing with set up assist and verbal cuing for completion of task (bra placement, pulling shirt down.) 3 STS completed from elevated surface to RW while lower body dressing/hygiene with minA and verbal cuing for anterior weight shifting. Pt required heavy assist for lower body dressing while she maintained balance with BUE support on RW.   Stand-step transfer to wc with RW and MinA for balance.   Pt donned maxi-sky harness partially in seated/partially in standing BUE on RW and CGA with ability to compensate during perturbations. 2x17fRW MinA for maintaining balance. Pt demonstrates anterior lean that increases with fatigue, forefoot strike/"toe walking" presentation, decreased RLE step length, and decreased left weight shifting in stance.  ~1 min standing balance LUE HHA with small red wedge placed under heels for error argumentation. ModA to maintain balance. Verbal cuing to push heels into wedge for improved COG within  BOS.  Repeated gait training 2x1057filateral HHA MaxA LUE, MinA RUE with noted improved in weight bearing through heels and decreased anterior lean.   Power w/c mobility ~200f49f <> main therapy gym. Pt required occasional verbal cuing for control with speed and when navigating door frames to prevent injury. When backing into space in room at the end of session, pt requires consistent cuing for directional placement in order to safely achieve final destination while navigating obstacles in room safely in reverse.   Pt in wheelchair with seatbelt alarm on and call bell within reach. All needs met at this time.   Session Two:  Pt in wheelchair and agreeable to therapy.  Power w/c mobility ~200ft18f<> main therapy gym. Same cuing as previous session.  STS in session with MinA for assist with boosting up and verbal cuing for hand placement to control descent.   Gait training with bilateral HHA 27ft,34ft, 42f [id number] + MinA-progressing to ModA witHawleytigue RUE. Verbal cuing for weight through heels in stance to prevent toe walking/anterior LOB. Pt demonstrated one instance of requiring to stop and reset balance into heels during each gait bout (much improved from previous session). Rest breaks provided in between bouts with pt education on body mechanics, explanation of cuing/technique instructions. Pt reported toe walker at baseline/"we were told to run on our toes in track."   Pt in wheelchair with seatbelt alarm on and call bell within reach. All needs met at this time.   Therapy Documentation Precautions:  Precautions Precautions: Fall, Other (comment) Precaution Comments: watch gait belt placement - insulin pump Rt abdomen  Restrictions Weight Bearing Restrictions: No Pain: Pain Assessment Pain Scale: 0-10 Pain Score: 0-No pain Faces Pain Scale: No hurt    Therapy/Group: Individual Therapy  Aaryanna Hyden, SPT 08/16/2021, 12:08 PM

## 2021-08-16 NOTE — Progress Notes (Signed)
Occupational Therapy Session Note  Patient Details  Name: [name] MRN: [id number] Date of Birth: Apr 01, 1951  Today's Date: 08/16/2021 OT Individual Time: 1301-1400 OT Individual Time Calculation (min): 59 min    Short Term Goals: Week 1:  OT Short Term Goal 1 (Week 1): Pt will complete UB dressing with supervision for two consecutive sessions. OT Short Term Goal 2 (Week 1): Pt will complete LB bathing with min assist sit to stand with use of AE PRN. OT Short Term Goal 3 (Week 1): Pt will complete LB dressing with min assist using AE PRN. OT Short Term Goal 4 (Week 1): Pt will complete toilet transfer with min assist using the RW and 3:1.  Skilled Therapeutic Interventions/Progress Updates:    Session 1: (1301-1400)  Pt completed transfers power wheelchair to bed and back with use of the RW and mod assist overall.  She then worked on bed mobility rolling side to side with min facilitation without using the rail as well as transitions to sitting from the right side with min guard assist using the rail.  With rolling emphasis was placed on activation of the abdominal muscles to assist with task instead of just relying on the arms to pull herself over.  Secondary to her size, it is difficult to roll without using the rails.  Finished session with education on sit to stand sequencing with scooting forward to the edge of the chair, with feet positioned under her knees, and then flexing trunk to sit.  She was able to complete sit to stand from her wheelchair with min assist following technique and with use of the arm rests.  This was more difficult from the EOB.  Finished session with pt in the power wheelchair and with the call button and phone in reach.    Session 2: [id number]) Pt in power chair to start session.  She was able to drive herself down to the ortho gym with supervision, where she worked on LUE strengthening with use of the UE ergonometer.  She was able to use BUEs with  resistance set on level 7 and RPMs maintained around 15-20.  First interval was for 5 mins peddling forward and then the second for 5 mins peddling backwards.  No pain noted.  Finished session with pt returning to the room and left in the wheelchair at bedside.  Safety alarm in place with call button in reach.    Therapy Documentation Precautions:  Precautions Precautions: Fall, Other (comment) Precaution Comments: watch gait belt placement - insulin pump Rt abdomen Restrictions Weight Bearing Restrictions: No  Pain: Pain Assessment Pain Scale: Faces Pain Score: 0-No pain Faces Pain Scale: No hurt ADL: See Care Tool Section for some details of mobility and selfcare  Therapy/Group: Individual Therapy  Salina Stanfield OTR/L 08/16/2021, 3:45 PM

## 2021-08-17 LAB — GLUCOSE, CAPILLARY
Glucose-Capillary: 253 mg/dL — ABNORMAL HIGH (ref 70–99)
Glucose-Capillary: 192 mg/dL — ABNORMAL HIGH (ref 70–99)
Glucose-Capillary: 176 mg/dL — ABNORMAL HIGH (ref 70–99)

## 2021-08-17 NOTE — Progress Notes (Signed)
Occupational Therapy Weekly Progress Note  Patient Details  Name: [name] MRN: [id number] Date of Birth: 1951/04/24  Beginning of progress report period: August 11, 2021 End of progress report period: August 17, 2021  Today's Date: 08/17/2021 OT Individual Time: 5681-2751 OT Individual Time Calculation (min): 57 min    Patient has met 1 of 3 short term goals.  Ms. Ausley is making steady progress with OT at this time overall.  She is utilizing AE (reacher, sockaide, LH sponge) for LB bathing and dressing at an overall mod assist level.  UB bathing is at a supervision level with min assist for dressing to pull her sports bra and shirt down in the back.  She is able to complete sit to stand from various surfaces at min to mod assist with decreased efficiency with forward weightshift.  Transfers are min to mod assist as well with use of the RW.  She exhibits decreased LUE coordination and strength secondary to history of left arm fx per her report as well as CVA.  She uses it currently at a diminished level for all selfcare tasks with modified independence.  Feel she is on target to meet supervision level goals but unsure if family will be able to provide this secondary to her sister working during the day.  Will continue with current OT POC with recommendation for continued CIR level therapy.    Patient continues to demonstrate the following deficits: muscle weakness, muscle joint tightness, and muscle paralysis, impaired timing and sequencing, abnormal tone, unbalanced muscle activation, and decreased coordination, decreased initiation, decreased awareness, decreased safety awareness, and decreased memory, and decreased sitting balance, decreased standing balance, hemiplegia, and decreased balance strategies and therefore will continue to benefit from skilled OT intervention to enhance overall performance with BADL and Reduce care partner burden.  Patient progressing toward long  term goals..  Continue plan of care.  OT Short Term Goals Week 2:  OT Short Term Goal 1 (Week 2): Pt will complete LB dressing with min assist using AE PRN. OT Short Term Goal 2 (Week 2): Pt will complete toilet transfer with min assist using the RW and 3:1. OT Short Term Goal 3 (Week 2): Pt will complete UB dressing with supervision for two consecutive sessions.  Skilled Therapeutic Interventions/Progress Updates:    Pt in power wheelchair to start.  Had her drive herself down to the ortho gym with supervision and increased time to avoid obstacles in the hallways and doorways.  Had her work on sit to stand transitions with use of the RW while engaged in LUE with the BITS system.  She was able to complete Visual Scanning Program for 2 sets of 2 mins with 70% accuracy and an average of 3 seconds on first, and 80% accuracy and 1.7 seconds on the second.  Second interval was adjusted to more medial and left targets secondary to not being able to reach across to the right efficiently.  She progressed to completion of Rotary Sequence in standing with 71% accuracy for a 30 number sequence in 2:30.  She averaged 5 seconds between numbers with min facilitation needed to reach up to the top secondary to limited ROM.  Finished with completion of Elnoria Howard Chart where she was able to perform 3 puzzles in under 2.5 mins.  All intervals in standing with only min assist to complete sit to stand and min guard to maintain balance.  She drove herself back to the room where she remained sitting up in  the wheelchair with the call button and phone in reach.    Therapy Documentation Precautions:  Precautions Precautions: Fall, Other (comment) Precaution Comments: watch gait belt placement - insulin pump Rt abdomen Restrictions Weight Bearing Restrictions: No  Pain: Pain Assessment Pain Scale: Faces Pain Score: 0-No pain ADL: See Care Tool Section for some details of mobility and selfcare   Therapy/Group: Individual  Therapy  Glorianne Proctor OTR/L 08/17/2021, 3:45 PM

## 2021-08-17 NOTE — Progress Notes (Signed)
Physical Therapy Session Note  Patient Details  Name: [name] MRN: [id number] Date of Birth: May 03, 1951  Today's Date: 08/17/2021 PT Individual Time: 0930-0959 PT Individual Time Calculation (min): 29 min   Short Term Goals: Week 1:  PT Short Term Goal 1 (Week 1): Pt will perform supine<>sit using bedrail with min assist PT Short Term Goal 2 (Week 1): Pt will perform sit<>stands using LRAD with CGA PT Short Term Goal 3 (Week 1): Pt will perform bed<>chair transfers using LRAD with CGA PT Short Term Goal 4 (Week 1): Pt will ambulate at least 43f using LRAD with CGA  Skilled Therapeutic Interventions/Progress Updates:     Pt sitting in power w/c to start session. No reports of pain. She drives her power chair in hallways with supervision, >2548f Completes stand<>pivot transfer with min/modA from power chair to mat table with cues for stepping patterns and general sequencing. Completed 1x5 sit<>stands with minA from mat table height (knee's locking in extension and relying on back of knees against mat table for balance). Remainder of session focused on functional gait training - she ambulated 2x3033fith modA with bilateral HHA via face-to-face technique with pt grasping therapist's elbows. She has a wide BOS with slightly forward leaning trunk, short bilateral step length. No knee buckling or overt LOB but quite unsteady. Mild shortness of breath after gait trials, requiring brief seated rest break for recovery. Returned to her room with supervision in power w/c, remained seated with safety belt alarm on and all needs in reach at end of session.   Therapy Documentation Precautions:  Precautions Precautions: Fall, Other (comment) Precaution Comments: watch gait belt placement - insulin pump Rt abdomen Restrictions Weight Bearing Restrictions: No General:    Therapy/Group: Individual Therapy  Wynn Alldredge P Kalon Erhardt PT 08/17/2021, 7:47 AM

## 2021-08-17 NOTE — Patient Care Conference (Signed)
Inpatient RehabilitationTeam Conference and Plan of Care Update Date: 08/17/2021   Time: 10:30 AM    Patient Name: [name] Record Number: [id number]  Date of Birth: 1951-12-02 Sex: Female         Room/Bed: 4W01C/4W01C-01 Payor Info: Payor: [employer] MEDICARE / Plan: [employer] MEDICARE CHOICE PPO / Product Type: *No Product type* /    Admit Date/Time:  08/10/2021  3:19 PM  Primary Diagnosis:  Left pontine cerebrovascular accident [facility])  Hospital Problems: Principal Problem:   Left pontine cerebrovascular accident [facility])    Expected Discharge Date: Expected Discharge Date: 08/30/21  Team Members Present: Physician leading conference: Dr. Leeroy Cha Social Worker Present: Loralee Pacas, [place] Nurse Present: Dorien Chihuahua, RN PT Present: Page Spiro, PT OT Present: Clyda Greener, OT SLP Present: Sherren Kerns, SLP PPS Coordinator present : Gunnar Fusi, SLP     Current Status/Progress Goal Weekly Team Focus  Bowel/Bladder   continent to bow and bladder  Pt to remain continent to b/b      Swallow/Nutrition/ Hydration             ADL's   Supervision for UB bathing with min assist for UB dressing.  Min to mod for LB bathing and dressing with min to mod for transfers using the RW and wide 3:1.  supervision overall  selfcare retraining, transfer training, neuromuscular re-education, balance retraining, therapeutic activities, pt/family education   Mobility   mod assist bed mobility, min/mod assist sit<>stand and stand pivot transfers using RW, min/mod assist gait up to 34f using RW with pt demoing anterior lean and "toe walking" with significantly impaired balance, power wheelchair mobility >2065fwith supervision and hand-over-hand assistance to navigate in small spaces or backing up next to surfaces with pt demoing delayed response time when bumping items  supervision overall with ambulation and mod-I at power w/c level  power wheelchair mobility,  transfer training, bed mobility training, gait training, dynamic standing balance, midline orientation, activity tolerance, pt education   Communication             Safety/Cognition/ Behavioral Observations            Pain   Pain controlled on current regimen, takes occasional tylenol for arthritic pain  Pain to remain controlled      Skin   Pressure injury to left heel, eschar noted. float heels with boots when in bed  wound to continue to show signs of healing,        Discharge Planning:  Home with sister and granddaughter/daughter-12 yo. Wil be alone during the day while sister works   Team Discussion: SBP elevated per MD with hx of multiple medical issues; stable at present. Unsafe driving power chair and has several skin integrity sites from bumping legs and old callous on left heel. Patient with poor balance recovery and limited sensation of feed. Presents with a stiff, uncoordinated gait and walks on her toes.  Patient on target to meet rehab goals: yes, currently supervision for upper body bathing and imn assist for upper body dressing. Requires min - mod assist for lower body care with an assistive device and help putting on TEDs. Sit -to-stand transfers are variable; can be min - mod assist. Left UE used at a diminished level. Requires supervision for toileting. Goals for discharge set at supervision level.  *See Care Plan and progress notes for long and short-term goals.   Revisions to Treatment Plan:    Teaching Needs: Safety,  medications, secondary risk management, transfers, etc.  Current Barriers to Discharge: Decreased caregiver support and Home enviroment access/layout  Possible Resolutions to Barriers: Family education with sister     Medical Summary Current Status: denies pain, obesity (BMI 40.40), SBP elevated, DBP soft, OSA, paroxysmal atrial fibrillation, elevated triglycerides  Barriers to Discharge: Medical stability  Barriers to Discharge Comments:  denies pain, obesity (BMI 40.40), SBP elevated, DBP soft, OSA, paroxysmal atrial fibrillation, elevated triglycerides Possible Resolutions to [employer] Focus: provide dietary education, continue to monitor BP TID/continue amlodipine, continue CPAP, continue Eliquis and monitoring HR TID, continue Lovaza   Continued Need for Acute Rehabilitation Level of Care: The patient requires daily medical management by a physician with specialized training in physical medicine and rehabilitation for the following reasons: Direction of a multidisciplinary physical rehabilitation program to maximize functional independence : Yes Medical management of patient stability for increased activity during participation in an intensive rehabilitation regime.: Yes Analysis of laboratory values and/or radiology reports with any subsequent need for medication adjustment and/or medical intervention. : Yes   I attest that I was present, lead the team conference, and concur with the assessment and plan of the team.   Dorien Chihuahua B 08/17/2021, 2:20 PM

## 2021-08-17 NOTE — Progress Notes (Signed)
Physical Therapy Session Note  Patient Details  Name: [name] MRN: [id number] Date of Birth: December 30, 1950  Today's Date: 08/17/2021 PT Individual Time: 0807-0921 PT Individual Time Calculation (min): 74 min   Short Term Goals: Week 1:  PT Short Term Goal 1 (Week 1): Pt will perform supine<>sit using bedrail with min assist PT Short Term Goal 2 (Week 1): Pt will perform sit<>stands using LRAD with CGA PT Short Term Goal 3 (Week 1): Pt will perform bed<>chair transfers using LRAD with CGA PT Short Term Goal 4 (Week 1): Pt will ambulate at least 69f using LRAD with CGA  Skilled Therapeutic Interventions/Progress Updates:   Pt received supine in bed and agreeable to therapy session. Requests to complete bathing EOB - educated pt on option to complete bathing in the shower with OT this afternoon; however, pt reports she has a fear of falling and hitting her head in the shower - educated her on OTs ability to discuss safe shower techniques to decrease fall risk - will inform OT of pt's fear. Supine>sitting R EOB, HOB flat and not using bedrail with min assist for trunk upright as pt has difficult time flexing forward at the trunk and scooting hips towards EOB.   Sitting EOB completed UB bathing and dressing with set-up assist, except total assist to wash back. Sit>stand from partially elevated EOB>RW with heavy min assist for lifting to stand - demos recall of education/training during OT session regarding scooting forward, bringing feet back, and leaning forward to come to stand. Mild/Moderate sway in standing while pulling up/down clean/dirty LB clothing - requires assistance to pull up L side of pants. Min assist for balance with pt using UE support on RW as needed.    Power w/c mobility (pt's personal w/c) ~2041f2x to/from main therapy gym with supervision and pt demoing increased safety awareness and improving control with turning and navigating small spaces.  Gait training 5085f2x using RW with min assist for balance - pt continues to have wide BOS, uncoordinated B LE movements with stiff/rigid swing phase mechanics, anterior weight shift with "toe walking" (though improving), and decreased R LE step length - cuing throughout for improvement . When turning to sit pt requires cuing to ensure bringing AD back close to her body while stepping backwards so as to prevent worsening anterior lean/LOB. At end of session pt left seated in power w/c with needs in reach, seat belt alarm on, and nurse present.   Therapy Documentation Precautions:  Precautions Precautions: Fall, Other (comment) Precaution Comments: watch gait belt placement - insulin pump Rt abdomen Restrictions Weight Bearing Restrictions: No   Pain: Reports some increased pain/discomfort in L UE stating it is from where she is using it more - no intervention needed.    Therapy/Group: Individual Therapy  CarTawana ScalePT, DPT, NCS, CSRS 08/17/2021, 7:48 AM

## 2021-08-17 NOTE — Progress Notes (Signed)
Occupational Therapy Session Note  Patient Details  Name: [name] MRN: [id number] Date of Birth: [birth year]  Today's Date: 08/17/2021 OT Group Time: 1100-1200 OT Group Time Calculation (min): 60 min   Short Term Goals: Week 2:  OT Short Term Goal 1 (Week 2): Pt will complete LB dressing with min assist using AE PRN. OT Short Term Goal 2 (Week 2): Pt will complete toilet transfer with min assist using the RW and 3:1. OT Short Term Goal 3 (Week 2): Pt will complete UB dressing with supervision for two consecutive sessions.  Skilled Therapeutic Interventions/Progress Updates:  Pt participated in group session with a focuc on [facility], functional reach with LUE, problem solving, attention and social interaction. Pt actively participated in group game of "Sorry" where pts were instructed on rules of game. Pt using LUE to retrieve card having to translate card/ flip card in hand to read instructions. Pt followed in instructions based on card to move marker on game board. Pt able to reach out of BOS to maneuver game pieces around board with supervision using LUE. Pt able to problem solve through instructions on card with MIN cues initially progressing to no cues. Pt with great attention to task, noted to actively interact with all group members encouraging all group members throughout session. Pt returned to room by RT.   Therapy Documentation Precautions:  Precautions Precautions: Fall, Other (comment) Precaution Comments: watch gait belt placement - insulin pump Rt abdomen Restrictions Weight Bearing Restrictions: No  Pain: Pt reports no pain during group session.    Therapy/Group: Group Therapy  [name] 08/17/2021, 3:49 PM

## 2021-08-17 NOTE — Progress Notes (Addendum)
Patient ID: [name], female   DOB: [birth year], 70 y.o.   MRN: [id number]  This SW covering for primary SW, Erlene Quan.  SW met with pt in room to provide updates from team conference, and d/c date 9/6. Pt stated SW should call her niece [name] to provide updates so she can share with her sister [name].   1713- SW left message for pt niece [name] requesting f/u to provide updates from team conference.  *SW received return phone call from pt dtr [name] to provide updates. Expressed concerns with regard the best d/c plan since she and her mother continue to work. SW shared what is possible for her such as HH therapies. SW discussed family edu. She will f/u with SW about date/time.  Loralee Pacas, MSW, [place] Office: [phone] Cell: [phone] Fax: [phone]

## 2021-08-18 LAB — GLUCOSE, CAPILLARY
Glucose-Capillary: 193 mg/dL — ABNORMAL HIGH (ref 70–99)
Glucose-Capillary: 159 mg/dL — ABNORMAL HIGH (ref 70–99)
Glucose-Capillary: 127 mg/dL — ABNORMAL HIGH (ref 70–99)
Glucose-Capillary: 243 mg/dL — ABNORMAL HIGH (ref 70–99)
Glucose-Capillary: 249 mg/dL — ABNORMAL HIGH (ref 70–99)

## 2021-08-18 NOTE — Progress Notes (Signed)
PROGRESS NOTE   Subjective/Complaints:  Pt reports no issues- pretty tired- in foldable power w/c working with PT.   ROS-  Pt denies SOB, abd pain, CP, N/V/C/D, and vision changes    Objective:   No results found. No results for input(s): WBC, HGB, HCT, PLT in the last 72 hours.  No results for input(s): NA, K, CL, CO2, GLUCOSE, BUN, CREATININE, CALCIUM in the last 72 hours.   Intake/Output Summary (Last 24 hours) at 08/18/2021 1057 Last data filed at 08/18/2021 0855 Gross per 24 hour  Intake 420 ml  Output --  Net 420 ml     Pressure Injury 08/10/21 Heel Left;Lateral Unstageable - Full thickness tissue loss in which the base of the injury is covered by slough (yellow, tan, gray, green or brown) and/or eschar (tan, brown or black) in the wound bed. (Active)  08/10/21   Location: Heel  Location Orientation: Left;Lateral  Staging: Unstageable - Full thickness tissue loss in which the base of the injury is covered by slough (yellow, tan, gray, green or brown) and/or eschar (tan, brown or black) in the wound bed.  Wound Description (Comments):   Present on Admission: Yes    Physical Exam: Vital Signs Blood pressure (!) 132/53, pulse 70, temperature 97.9 F (36.6 C), resp. rate 18, height '5\' 7"'$  (1.702 m), weight 118.3 kg, SpO2 94 %.    General: awake, alert, appropriate, wearing eyeglasses; sitting in foldable power w/c with therapy in hallway at gym;  NAD HENT: conjugate gaze; oropharynx moist CV: regular rate; no JVD Pulmonary: CTA B/L; no W/R/R- good air movement GI: soft, NT, ND, (+)BS; normoactive Psychiatric: appropriate; interactive; a little vague Neurological: alert  Skin: No evidence of breakdown, no evidence of rash Neurologic: Cranial nerves II through XII intact, motor strength is 4/5 in bilateral deltoid, bicep, tricep, grip, hip flexor, knee extensors, ankle dorsiflexor and plantar flexor Sensory  exam normal sensation to light touch and proprioception in bilateral upper and lower extremities Cerebellar exam normal finger to nose to finger as well as heel to shin in bilateral upper and lower extremities Musculoskeletal: Full range of motion in all 4 extremities. No joint swelling   Assessment/Plan: 1. Functional deficits which require 3+ hours per day of interdisciplinary therapy in a comprehensive inpatient rehab setting. Physiatrist is providing close team supervision and 24 hour management of active medical problems listed below. Physiatrist and rehab team continue to assess barriers to discharge/monitor patient progress toward functional and medical goals  Care Tool:  Bathing    Body parts bathed by patient: Right arm, Left arm, Chest, Abdomen, Right upper leg, Left upper leg, Face, Left lower leg, Right lower leg, Front perineal area   Body parts bathed by helper: Left lower leg, Right lower leg, Front perineal area, Buttocks Body parts n/a: Front perineal area, Buttocks   Bathing assist Assist Level: Minimal Assistance - Patient > 75%     Upper Body Dressing/Undressing Upper body dressing   What is the patient wearing?: Pull over shirt, Bra    Upper body assist Assist Level: Minimal Assistance - Patient > 75%    Lower Body Dressing/Undressing Lower body dressing  What is the patient wearing?: Pants, Underwear/pull up     Lower body assist Assist for lower body dressing: Moderate Assistance - Patient 50 - 74%     Toileting Toileting    Toileting assist Assist for toileting: 2 Helpers     Transfers Chair/bed transfer  Transfers assist     Chair/bed transfer assist level: Minimal Assistance - Patient > 75% Chair/bed transfer assistive device: Museum/gallery exhibitions officer assist      Assist level: Minimal Assistance - Patient > 75% Assistive device: Walker-rolling Max distance: 52f   Walk 10 feet activity   Assist      Assist level: Minimal Assistance - Patient > 75% Assistive device: Walker-rolling   Walk 50 feet activity   Assist Walk 50 feet with 2 turns activity did not occur: Safety/medical concerns  Assist level: Minimal Assistance - Patient > 75% Assistive device: Walker-rolling    Walk 150 feet activity   Assist Walk 150 feet activity did not occur: Safety/medical concerns         Walk 10 feet on uneven surface  activity   Assist Walk 10 feet on uneven surfaces activity did not occur: Safety/medical concerns         Wheelchair     Assist Is the patient using a wheelchair?: Yes Type of Wheelchair: Power    Wheelchair assist level: Supervision/Verbal cueing, Set up assist Max wheelchair distance: 2061f   Wheelchair 50 feet with 2 turns activity    Assist        Assist Level: Supervision/Verbal cueing   Wheelchair 150 feet activity     Assist      Assist Level: Supervision/Verbal cueing   Blood pressure (!) 132/53, pulse 70, temperature 97.9 F (36.6 C), resp. rate 18, height '5\' 7"'$  (1.702 m), weight 118.3 kg, SpO2 94 %.  Medical Problem List and Plan: 1.  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  Antithrombotics: -DVT/anticoagulation: Eliquis Mechanical: Sequential compression devices, below knee Bilateral lower extremities Pharmaceutical: Lovenox             -antiplatelet therapy: N/A 3. Pain Management: Neurontin 100 mg 3 times daily             Monitor with increased exertion 4. Mood: Prozac 30 mg daily             -antipsychotic agents: N/A 5. Neuropsych: This patient is capable of making decisions on her own behalf. 6. Skin/Wound Care/W  OC follow-up: Routine skin checks.  Left heel and right shin wounds 7. Fluids/Electrolytes/Nutrition: Routine in and outs             CMP ordered for tomorrow. 8.  Hypertension.  Norvasc 10 mg daily.         Vitals:   08/17/21 2056 08/18/21 0604  BP:  (!) 132/53  Pulse: 75 70  Resp: 18 18  Temp:  97.9 F (36.6 C)  SpO2: 96% 94%   8/25- BP looks great- con't regimen 9.  History of atrial  fibrillation status post pacemaker.  Cardiac rate controlled.  Continue Eliquis 10.  Diabetes mellitus with peripheral neuropathy.  Patient does have an insulin pump that is being utilized          CBG (last 3)  Recent Labs    08/17/21 2110 08/18/21 0236 08/18/21 0606  GLUCAP 176* 193* 159*  Elevated diabetes coordinator to assist with pump settings Advised pt to increase basal rate to 1.5 U per hr, pt has done this - improving - has reduced basal rate at night to avoid hypoglycemia   8/25- BG's [id number]- will see if DM coordinators have any other suggestions.  Have called- they will look into it.  11.  Diastolic congestive heart failure.  Demadex 10 mg daily.  Monitor for any signs of fluid overload      Filed Weights    08/08/21 0611 08/09/21 0604 08/10/21 0500  Weight: 123.4 kg 121.7 kg 121.9 kg    8/25-weight stable at 118 kg- con't to monitor 12.  Hyperlipidemia.  Lovaza/Lipitor 13.  Hypothyroidism.  Synthroid 14.  GERD.  Protonix 15.  Morbid obesity.  BMI 42.02.  Dietary follow-up.  Encourage weight loss  8/25- weight down to BMI of 40.85  17.  OSA.  BiPAP    LOS: 8 days A FACE TO FACE EVALUATION WAS PERFORMED  Lynzie Cliburn 08/18/2021, 10:57 AM

## 2021-08-18 NOTE — Progress Notes (Signed)
Inpatient Diabetes Program Recommendations  AACE/ADA: New Consensus Statement on Inpatient Glycemic Control (2015)  Target Ranges:  Prepandial:   less than 140 mg/dL      Peak postprandial:   less than 180 mg/dL (1-2 hours)      Critically ill patients:  140 - 180 mg/dL   Results for Human, [name] "ROSETTA" (MRN [id number]) as of 08/18/2021 11:07  Ref. Range 08/16/2021 06:02 08/16/2021 11:18 08/16/2021 16:50 08/16/2021 21:02  Glucose-Capillary Latest Ref Range: 70 - 99 mg/dL 166 (H) 210 (H) 122 (H) 208 (H)  Results for Eicher, [name] "ROSETTA" (MRN [id number]) as of 08/18/2021 11:07  Ref. Range 08/17/2021 12:08 08/17/2021 17:10 08/17/2021 21:10  Glucose-Capillary Latest Ref Range: 70 - 99 mg/dL 253 (H) 192 (H) 176 (H)  Results for Lich, [name] "ROSETTA" (MRN [id number]) as of 08/18/2021 11:07  Ref. Range 08/18/2021 06:06  Glucose-Capillary Latest Ref Range: 70 - 99 mg/dL 159 (H)    Home Dm Meds: Insulin Pump  Current Orders: Insulin Pump     Paged by Dr. Dagoberto Ligas with Rehab.  Reviewed CBGs with Dr. Dagoberto Ligas.  Currently, pt managing her CBGs fairly well with her home insulin pump.  Having an occasional CBG >200, however, mostly well controlled.  The Diabetes Team will follow pt's CBGs daily and make recs as needed.  I advised Dr. Dagoberto Ligas that pt could call her Endocrinologist's office ([name] with [facility]) for advice on her pump if needed.  Pt may need slight adjustment of carbohydrate ratio if she has multiple post-meal elevations >200.  Pt would need to call ENDO for pump adjustments as independent pump adjustments are outside the scope of practice of the diabetes coordinator.    --Will follow patient during hospitalization--  Wyn Quaker RN, MSN, CDE Diabetes Coordinator Inpatient Glycemic Control Team Team Pager: [phone] (8a-5p)

## 2021-08-18 NOTE — Progress Notes (Signed)
Physical Therapy Weekly Progress Note  Patient Details  Name: [name] MRN: [id number] Date of Birth: 1951-03-20  Beginning of progress report period: August 11, 2021 End of progress report period: August 18, 2021  Today's Date: 08/18/2021 PT Individual Time: 0805-0921 PT Individual Time Calculation (min): 76 min   Patient has met 2 of 4 short term goals and progressing towards remaining two goals.  [name] is making steady progress towards her STG and LTG. Currently pt is MinA for bed mobility, MinA that progresses to CGA for STS through the session as she "warms up," bed to chair transfers using RW with MinA, and ambulating up to 57f with bilateral HHA of 2 with ModA and  MinA when using RW. Due to pt continuing to progress towards goals, length of stay is planned for longer than originally anticipated at eval and initial goal setting for continued benefit with functional mobility.   Patient continues to demonstrate the following deficits muscle weakness, decreased cardiorespiratoy endurance, impaired timing and sequencing, decreased coordination, and decreased motor planning, and decreased standing balance, decreased postural control, and decreased balance strategies and therefore will continue to benefit from skilled PT intervention to increase functional independence with mobility.  Patient progressing toward long term goals..  Continue plan of care.  PT Short Term Goals Week 1:  PT Short Term Goal 1 (Week 1): Pt will perform supine<>sit using bedrail with min assist PT Short Term Goal 1 - Progress (Week 1): Met PT Short Term Goal 2 (Week 1): Pt will perform sit<>stands using LRAD with CGA PT Short Term Goal 2 - Progress (Week 1): Met PT Short Term Goal 3 (Week 1): Pt will perform bed<>chair transfers using LRAD with CGA PT Short Term Goal 3 - Progress (Week 1): Progressing toward goal PT Short Term Goal 4 (Week 1): Pt will ambulate at least 29fusing LRAD  with CGA PT Short Term Goal 4 - Progress (Week 1): Progressing toward goal Week 2:  PT Short Term Goal 1 (Week 2): Pt will perform supine<>sit using bedrail with CGA PT Short Term Goal 2 (Week 2): Pt will perform bed<>chair transfers using LRAD with CGA PT Short Term Goal 3 (Week 2): Pt will perform bed<>chair transfers using LRAD with CGA PT Short Term Goal 4 (Week 2): Pt will ambulate at least 3068fsing LRAD with CGA PT Short Term Goal 5 (Week 2): Pt will demonstrate body awareness with wheelchair mobility, requiring verbal cuing <25% for safely navigating obstacles without making contact  Skilled Therapeutic Interventions/Progress Updates:  Pt received in bed with HOB elevated and eager to start therapy. Therapist doffs prevalon boots.  Pt states mild knee "aching" during session R>L with fatigue.   Supine to EOB with minA for terminal trunk transition. Good dynamic balance with bilateral feet unsupported while therapist donns TEDs and shoes. Healing pressure wound on left heel.   Initial STS with bedrail and Min-modA with assist to prevent posterior LOB. Following sit<>stands in session progressed to consistently CGA with noted improvement in anterior weight shifting.  Stand pivot transfers with RW and MinA throuhgout session. Pt utilizes tactile cuing of legs against seat and BUE support on armrests for safety with stand>sit transitions.   Power w/c mobility (pt's personal w/c) ~200f80f to/from main therapy gym with supervision and requires cuing in tight spaces ~35% of time to ensure safety/prevent making contact with obstacles with turns in tight spaces.    Gait training forwards 70ft24fft,66ft, 27fteral  HHA of two with [place] throughout. Pt continues to demonstrate "toe walking" technique and moderate anterior lean, however is improving her ability to weight bear/shift through heels for improved COG over BOS with verbal cuing required <50% of time, self-correcting rest of time. Longer  distances also indicating improvement in endurance.  Lateral stepping with BUE on handrail in hall 6f x2 (left then right) with CGA-intermittent minA 2/2 catching heels of shoes on the other from narrow BOS. Attempted bilateral HHA of two for side stepping, however, pt states she does not feel steady (will try again later), pt able to increase distance to 175fwith hands replaced to handrail.  Backwards stepping 2029f2 with CGA posteriorly, one UE support on handrail, and 2nd therapist providing HHAStockettr steadying. Pt improved overall body mechanics with verbal and external cuing to weight shift towards the right.   1 step up/down onto biodex (~9" height) with BUE support on handrails and +2 minA for safety/assist to boost up. CGA +2 for steadying and safety with step down. Task completed for high step height navigation and in preparation for utilizing biodex during tomorrow's session.   Pt reports glucose monitor reading 57m45m, however, asymptomatic. Pt given two orange juices, RN notified and in room at end of session.  In wc with seatbelt alarm on and call bell within reach.   Therapy Documentation Precautions:  Precautions Precautions: Fall, Other (comment) Precaution Comments: watch gait belt placement - insulin pump Rt abdomen Restrictions Weight Bearing Restrictions: No  Pain: Pain Assessment Pain Scale: 0-10 Pain Score: 0-No pain Faces Pain Scale: Hurts a little bit Pain Location: Knee Pain Orientation: Right;Left Pain Descriptors / Indicators: Aching   Therapy/Group: Individual Therapy  Marne Meline, SPT  08/18/2021, 9:31 AM

## 2021-08-18 NOTE — Progress Notes (Addendum)
Occupational Therapy Session Note  Patient Details  Name: [name] MRN: [id number] Date of Birth: Aug 04, 1951  Today's Date: 08/18/2021 OT Individual Time: 1005-1058 OT Individual Time Calculation (min): 53 min  + 68 min   Short Term Goals: Week 2:  OT Short Term Goal 1 (Week 2): Pt will complete LB dressing with min assist using AE PRN. OT Short Term Goal 2 (Week 2): Pt will complete toilet transfer with min assist using the RW and 3:1. OT Short Term Goal 3 (Week 2): Pt will complete UB dressing with supervision for two consecutive sessions.  Skilled Therapeutic Interventions/Progress Updates:    Session 1 [phone]): Pt received seated in power w/c, req to shower, agreeable to therapy. Session focus on self-care retraining, activity tolerance, func transfers, AE education, LUE NMR in prep for improved ADL/IADL/func mobility performance + decreased caregiver burden. No c/o pain throughout. STS and short distance amb > TTB in shower with CGA to min A for balance/safe RW use. Doffed bra/shirt and bathed UB with S. Req min A to remove insulin pump prior to shower, parts covered in plastic. Doffed underwear / pants / teds/ B shoes with total A. Bathed LB with mod A for buttocks/lower BLE 2/2 small shower stall space. Pt elected to not use LH sponge this date despite encouragement. STS from TTB with min A and increased time + use of B grab bars. Completed UBD with min A to pull bra/shirt down in back, LBD with overall max A to thread BLE and to pull over L hip. Total A to redonn shoes 2/2 time restraint. Pt with overall good incorporation of LUE into functional tasks, expresses gratitude re being able to shower this date. Relates that she does not shower at home as her CG/sister is afraid she will fall. Short ambulation > power w/c CGA and RW.  Pt left seated in power w/c with safety belt alarm engaged, call bell in reach, and all immediate needs met.     Session 2 [phone]): Pt  received seated in power w/c, agreeable to therapy. Session focus on self-care retraining, activity tolerance, STS, AE education, BUE/BLE strengthening/NMR in prep for improved ADL/IADL/func mobility performance + decreased caregiver burden. Self-propelled power w/c to and from gym with distant S, able to appropriate park and back up with increased time/attempts. Pt frequently req A to pull up foot rest - provided bag for side of w/c to place reacher and increase ind prior to transferring. Pt demonstrating good carryover of using reacher for this throughout session. Short amb transfer > mat table with mod A to power up and CGA to complete + RW. Completed blocked practice of STS from elevated > lowest height at table, progressing from mod A to CGA with time. Req additional cues to use split hand method and to avoid "plopping" down to mat table. With 1 lb dowel rod, completed 1x10 B shoulder flexion + hold, chest press, + forward/backward circles. Practiced kicking back / forth ball with 4 lb weight placed on LLE, pt able to utilize reacher to retrieve / place ball. Finally, additional reacher practice picking up and tossing objects in prep for improved LBD performance. Short amb transfer > power w/c with min A to power up + RW, LOB posteriorly into power w/c. Reports LLE "gave out".   Pt left seated in power w/c with safety belt alarm engaged, call bell in reach, and all immediate needs met.    Therapy Documentation Precautions:  Precautions Precautions: Fall, Other (  comment) Precaution Comments: watch gait belt placement - insulin pump Rt abdomen Restrictions Weight Bearing Restrictions: No  Pain:  See session notes ADL: See Care Tool for more details.   Therapy/Group: Individual Therapy  Volanda Napoleon MS, OTR/L  08/18/2021, 6:39 AM

## 2021-08-19 LAB — GLUCOSE, CAPILLARY
Glucose-Capillary: 141 mg/dL — ABNORMAL HIGH (ref 70–99)
Glucose-Capillary: 155 mg/dL — ABNORMAL HIGH (ref 70–99)
Glucose-Capillary: 182 mg/dL — ABNORMAL HIGH (ref 70–99)
Glucose-Capillary: 188 mg/dL — ABNORMAL HIGH (ref 70–99)
Glucose-Capillary: 160 mg/dL — ABNORMAL HIGH (ref 70–99)

## 2021-08-19 NOTE — Progress Notes (Signed)
Occupational Therapy Session Note  Patient Details  Name: [name] MRN: [id number] Date of Birth: February 20, 1951  Today's Date: 08/19/2021 OT Individual Time: [id number] OT Individual Time Calculation (min): 58 min    Short Term Goals: Week 2:  OT Short Term Goal 1 (Week 2): Pt will complete LB dressing with min assist using AE PRN. OT Short Term Goal 2 (Week 2): Pt will complete toilet transfer with min assist using the RW and 3:1. OT Short Term Goal 3 (Week 2): Pt will complete UB dressing with supervision for two consecutive sessions.  Skilled Therapeutic Interventions/Progress Updates:    Pt in power wheelchair to start with agreement on working on bathing and dressing tasks this session.  She was able to complete UB selfcare with supervision at the sink.  She was able to complete LB bathing with min assist.  She voiced the need to toilet during bathing and completed functional mobility into the bathroom with use of the RW and min assist.  Max assist was needed for toilet hygiene secondary to decreased thoroughness.  She reports using a toilet aide at home, but does not have it present.  She transferred back out to the wheelchair for continuation of bathing at supervision for just washing lower legs and feet with use of the LH sponge.  Min assist for donning underpants and pants with use of the reacher and for standing to pull them up over her hips.  Completed session with pt sitting in the wheelchair with safety belt in place and call button and phone in reach.  Therapy Documentation Precautions:  Falls, watch gait belt placement secondary to insulin pump  Pain: Pain Assessment Pain Scale: 0-10 Pain Score: 0-No pain ADL: See Care Tool Section for some details of mobility and selfcare  Therapy/Group: Individual Therapy  Jennifier Smitherman OTR/L 08/19/2021, 12:34 PM

## 2021-08-19 NOTE — Progress Notes (Signed)
Physical Therapy Session Note  Patient Details  Name: [name] MRN: [id number] Date of Birth: 1951/04/09  Today's Date: 08/19/2021 PT Individual Time: 0805-0900 PT Individual Time Calculation (min): 55 min   Short Term Goals: Week 2:  PT Short Term Goal 1 (Week 2): Pt will perform supine<>sit using bedrail with CGA PT Short Term Goal 2 (Week 2): Pt will perform bed<>chair transfers using LRAD with CGA PT Short Term Goal 3 (Week 2): Pt will perform bed<>chair transfers using LRAD with CGA PT Short Term Goal 4 (Week 2): Pt will ambulate at least 2f using LRAD with CGA PT Short Term Goal 5 (Week 2): Pt will demonstrate body awareness with wheelchair mobility, requiring verbal cuing <25% for safely navigating obstacles without making contact   Skilled Therapeutic Interventions/Progress Updates:  Session One:  Pt received in bed with HOB elevated. Pt agreeable to therapy and reports not having slept well. MinA supine>EOB, pt pulling up on therapist hand to assist in trunk transitioning. Good dynamic seated balance, no feet support while therapist donned TEDs and shoes.   Stand pivot transfers in session with minA for steadying and verbal cuing for RW proximity with backwards steps. STS to RW minA for steadying secondary to poor anterior weight shifting, progressing to CGA with verbal cuing.   Biodex with lateral weight shifting and l for NMR training with weight shifting and improved awareness of body spatial recognition. 1 step up/down onto ~9" height with BUE support on handrails and +2 modA for safety/assist to boost up. MinA+2 for steadying and safety with step down. Pt required manual facilitation of weight shifting R/L. Completed limits of stability with 41% accuracy and then 51%, external cuing as physical "target" to assist in directional weight shifting.   Gait training RW MinA 3x352f Pt states feeling more fatigue this session. Verbal cuing for posture and increase  weight shift L to allow for increased RLE step length. Pt continues to demonstrate "toe walking," only mild anterior lean noted this session.    >2 min standing balance LUE HHA with small red wedge placed under heels for error argumentation. CGA BUE support RW and then MinA with HHA on Left (pt not pushing through) RW on RUE to maintain balance. Verbal cuing to push heels into wedge for improved COG within BOS. Dual task of conversing about previous memories.   Power w/c mobility (pt's personal w/c) ~20026fx <> main therapy gym with supervision and requires cuing in tight spaces <25% of time to ensure safety/prevent making contact with obstacles with turns in tight spaces. Pt demonstrated improvement with backwards navigation, making no contact with objects in room and minimal cuing from therapist.   Pt in wheelchair with NT in room, call bell within reach. All needs met at this time.   Session Two:  Pt received in personal wc and agreeable to therapy.  Power w/c mobility (pt's personal w/c) ~120f27f <> day room. Pt demonstrates improved wheelchair control, speed and safety with objects requiring cuing <25% of time after session.  Nustep activity completed for endurance training and LE strengthening with focus on pushing through bilateral heels with peddling. Verbal cuing for relaxed arms and more power coming from LE. X8 at level 3 with a BORG exertion scale of 15. Post HR 114. X5min8mevel 4 with BORG 17, post HR 116.   Pt states light headed, given OJ. Pt personal glucose monitor 155 mg/dl. BP 113/59. Sx improved with rest.   Completed wc mobility control  and precision training with navigation of obstacles, around tight spaces, and backing in to simulated small space as in room for improved safety with personal wc to prevent injuries. X2 courses. One instance of pt running over cones when backing up, 2nd attempt much improved. Instructions only, no verbal cuing. Reaching for cones to clean up  with various ranges, cross body, and anterior weight shifting while seated.   Pt in personal wc with seatbelt alarm on and call bell within reach.   Therapy Documentation Precautions:  Precautions Precautions: Fall, Other (comment) Precaution Comments: watch gait belt placement - insulin pump Rt abdomen Restrictions Weight Bearing Restrictions: No  Pain: Pain Assessment Pain Scale: 0-10 Pain Score: 0-No pain  Therapy/Group: Individual Therapy  Jostin Rue, SPT  08/19/2021, 9:40 AM

## 2021-08-20 LAB — GLUCOSE, CAPILLARY
Glucose-Capillary: 168 mg/dL — ABNORMAL HIGH (ref 70–99)
Glucose-Capillary: 190 mg/dL — ABNORMAL HIGH (ref 70–99)
Glucose-Capillary: 182 mg/dL — ABNORMAL HIGH (ref 70–99)
Glucose-Capillary: 338 mg/dL — ABNORMAL HIGH (ref 70–99)

## 2021-08-20 MED ORDER — SORBITOL 70 % SOLN
30.0000 mL | Freq: Once | Status: AC
  Administered 2021-08-20: 30 mL via ORAL
  Filled 2021-08-20: qty 30

## 2021-08-20 NOTE — Progress Notes (Signed)
Occupational Therapy Session Note  Patient Details  Name: [name] MRN: [id number] Date of Birth: [birth year]  Today's Date: 08/20/2021 OT Individual Time: 1300-1345 OT Individual Time Calculation (min): 45 min    Short Term Goals: Week 2:  OT Short Term Goal 1 (Week 2): Pt will complete LB dressing with min assist using AE PRN. OT Short Term Goal 2 (Week 2): Pt will complete toilet transfer with min assist using the RW and 3:1. OT Short Term Goal 3 (Week 2): Pt will complete UB dressing with supervision for two consecutive sessions.  Skilled Therapeutic Interventions/Progress Updates:    Pt sitting on toilet with nurse tech present when OT arrived, reports feeling somewhat constipated.  Nurse made aware.  Pt reports she doesn't use her left hand much since she broke her arm a couple of years ago and also reports "dullness" in left fingers.    Pt stood from toilet with CGA using grab bar.  Needing max assist for pericare to ensure thoroughness (pt uses a periwand at baseline when at home) and min assist for clothing management due to limited left shoulder ROM.  Pt ambulated to sink using RW with CGA.  Washed hands with close supervision.  Step pivot transfer and stand to sit at power w/c with CGA using RW.    Pt transported to dayroom using power controls on w/c with supervision.  Completed box and blocks assessments: Right hand 35 blocks, Left hand 30 blocks.  Given left hand is non-dominant, results are [facility].  Assessed left shoulder IR behind back and noted significantly limited, only able to reach to lateral buttocks.  Pt returned to room and sister arrived.  Sister asking to schedule family education.  Notified primary OT.  Call bell in reach, seat alarm on at end of session.  Therapy Documentation Precautions:  Precautions Precautions: Fall, Other (comment) Precaution Comments: watch gait belt placement - insulin pump Rt abdomen Restrictions Weight Bearing Restrictions:  No    Therapy/Group: Individual Therapy  Ezekiel Slocumb 08/20/2021, 3:43 PM

## 2021-08-20 NOTE — Progress Notes (Signed)
PROGRESS NOTE   Subjective/Complaints:  Pt upset because Thursday night, resp didn't come to put on her BiPAP and didn't come last night til 2am to help with biPAP- she is unhappy reportedly about this.   Also , tearful this AM, not clear why- pt didn't share.    ROS-   Pt denies SOB, abd pain, CP, N/V/C/D, and vision changes   Objective:   No results found. No results for input(s): WBC, HGB, HCT, PLT in the last 72 hours.  No results for input(s): NA, K, CL, CO2, GLUCOSE, BUN, CREATININE, CALCIUM in the last 72 hours.   Intake/Output Summary (Last 24 hours) at 08/20/2021 1341 Last data filed at 08/20/2021 0800 Gross per 24 hour  Intake 300 ml  Output --  Net 300 ml     Pressure Injury 08/10/21 Heel Left;Lateral Unstageable - Full thickness tissue loss in which the base of the injury is covered by slough (yellow, tan, gray, green or brown) and/or eschar (tan, brown or black) in the wound bed. (Active)  08/10/21   Location: Heel  Location Orientation: Left;Lateral  Staging: Unstageable - Full thickness tissue loss in which the base of the injury is covered by slough (yellow, tan, gray, green or brown) and/or eschar (tan, brown or black) in the wound bed.  Wound Description (Comments):   Present on Admission: Yes    Physical Exam: Vital Signs Blood pressure 130/68, pulse 65, temperature 97.7 F (36.5 C), temperature source Oral, resp. rate 16, height '5\' 7"'$  (1.702 m), weight 118.7 kg, SpO2 100 %.     General: awake, alert, appropriate, sitting up in bed; OT in room;  NAD HENT: conjugate gaze; oropharynx moist CV: regular rate; no JVD Pulmonary: CTA B/L; no W/R/R- good air movement GI: soft, NT, ND, (+)BS Psychiatric: appropriate; but tearful at end of visit- pt didn't want to explain why Neurological: alert Skin: No evidence of breakdown, no evidence of rash Neurologic: Cranial nerves II through XII intact,  motor strength is 4/5 in bilateral deltoid, bicep, tricep, grip, hip flexor, knee extensors, ankle dorsiflexor and plantar flexor Sensory exam normal sensation to light touch and proprioception in bilateral upper and lower extremities Cerebellar exam normal finger to nose to finger as well as heel to shin in bilateral upper and lower extremities Musculoskeletal: Full range of motion in all 4 extremities. No joint swelling   Assessment/Plan: 1. Functional deficits which require 3+ hours per day of interdisciplinary therapy in a comprehensive inpatient rehab setting. Physiatrist is providing close team supervision and 24 hour management of active medical problems listed below. Physiatrist and rehab team continue to assess barriers to discharge/monitor patient progress toward functional and medical goals  Care Tool:  Bathing    Body parts bathed by patient: Right arm, Left arm, Chest, Abdomen, Right upper leg, Left upper leg, Face, Left lower leg, Right lower leg, Front perineal area   Body parts bathed by helper: Left lower leg, Right lower leg, Buttocks Body parts n/a: Buttocks   Bathing assist Assist Level: Minimal Assistance - Patient > 75%     Upper Body Dressing/Undressing Upper body dressing   What is the patient wearing?: Pull over shirt,  Bra    Upper body assist Assist Level: Supervision/Verbal cueing    Lower Body Dressing/Undressing Lower body dressing      What is the patient wearing?: Pants, Underwear/pull up     Lower body assist Assist for lower body dressing: Maximal Assistance - Patient 25 - 49%     Toileting Toileting    Toileting assist Assist for toileting: Minimal Assistance - Patient > 75%     Transfers Chair/bed transfer  Transfers assist     Chair/bed transfer assist level: Minimal Assistance - Patient > 75% Chair/bed transfer assistive device: Programmer, multimedia   Ambulation assist      Assist level: Minimal Assistance -  Patient > 75% Assistive device: Walker-rolling Max distance: 8'   Walk 10 feet activity   Assist     Assist level: Minimal Assistance - Patient > 75% Assistive device: Walker-rolling   Walk 50 feet activity   Assist Walk 50 feet with 2 turns activity did not occur: Safety/medical concerns  Assist level: Minimal Assistance - Patient > 75% Assistive device: Walker-rolling    Walk 150 feet activity   Assist Walk 150 feet activity did not occur: Safety/medical concerns         Walk 10 feet on uneven surface  activity   Assist Walk 10 feet on uneven surfaces activity did not occur: Safety/medical concerns         Wheelchair     Assist Is the patient using a wheelchair?: Yes Type of Wheelchair: Power    Wheelchair assist level: Supervision/Verbal cueing, Set up assist Max wheelchair distance: 268f    Wheelchair 50 feet with 2 turns activity    Assist        Assist Level: Supervision/Verbal cueing   Wheelchair 150 feet activity     Assist      Assist Level: Supervision/Verbal cueing   Blood pressure 130/68, pulse 65, temperature 97.7 F (36.5 C), temperature source Oral, resp. rate 16, height '5\' 7"'$  (1.702 m), weight 118.7 kg, SpO2 100 %.  Medical Problem List and Plan: 1.  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  Antithrombotics: -DVT/anticoagulation: Eliquis Mechanical: Sequential compression devices, below knee Bilateral lower extremities Pharmaceutical: Lovenox             -antiplatelet therapy: N/A 3. Pain Management: Neurontin 100 mg 3 times daily             Monitor with increased exertion 4. Mood: Prozac 30 mg daily              -antipsychotic agents: N/A 5. Neuropsych: This patient is capable of making decisions on her own behalf. 6. Skin/Wound Care/W OC follow-up: Routine skin checks.  Left heel and right shin wounds 7. Fluids/Electrolytes/Nutrition: Routine in and outs             CMP ordered for tomorrow.  8/27- last labs 8/18- will recheck on Monday AM- esp since Cr was rising slightly.  8.  Hypertension.  Norvasc 10 mg daily.  Vitals:   08/20/21 0100 08/20/21 0413  BP:  130/68  Pulse: 69 65  Resp: 16 16  Temp:  97.7 F (36.5 C)  SpO2:     8/27- BP controlled- con't regimen 9.  History of atrial fibrillation status post pacemaker.  Cardiac rate controlled.  Continue Eliquis 10.  Diabetes mellitus with peripheral neuropathy.  Patient does have an insulin pump that is being utilized          CBG (last 3)  Recent Labs    08/19/21 2139 08/20/21 0544 08/20/21 1142  GLUCAP 160* 168* 190*  Elevated diabetes coordinator to assist with pump settings Advised pt to increase basal rate to 1.5 U per hr, pt has done this - improving - has reduced basal rate at night to avoid hypoglycemia   8/25- BG's [id number]- will see if DM coordinators have any other suggestions.  Have called- they will look into it.   8/27- DM coordinators don't feel comfortable changing her Insulin pump- suggested causing Endo, however BG's looking better- con't to monitor 11.  Diastolic congestive heart failure.  Demadex 10 mg daily.  Monitor for any signs of fluid overload      Filed Weights    08/08/21 0611 08/09/21 0604 08/10/21 0500  Weight: 123.4 kg 121.7 kg 121.9 kg    8/25-weight stable at 118 kg- con't to monitor 12.  Hyperlipidemia.  Lovaza/Lipitor 13.  Hypothyroidism.  Synthroid 14.  GERD.  Protonix 15.  Morbid obesity.  BMI 42.02.  Dietary follow-up.  Encourage weight loss  8/25- weight down to BMI of 40.85  17.  OSA.  BiPAP  8/27- pt reports not getting BiPAP put on her; or help with BiPAP from resp, last 2 days-  Friday night after 2am- no documentation from Resp therapy to know.     LOS: 10 days A FACE TO FACE EVALUATION WAS PERFORMED  Stepen Prins 08/20/2021, 1:41 PM

## 2021-08-20 NOTE — Progress Notes (Signed)
Physical Therapy Session Note  Patient Details  Name: [name] MRN: [id number] Date of Birth: 17-Apr-1951  Today's Date: 08/20/2021 PT Individual Time: 0807-0903 PT Individual Time Calculation (min): 56 min   Short Term Goals: Week 2:  PT Short Term Goal 1 (Week 2): Pt will perform supine<>sit using bedrail with CGA PT Short Term Goal 2 (Week 2): Pt will perform bed<>chair transfers using LRAD with CGA PT Short Term Goal 3 (Week 2): Pt will perform bed<>chair transfers using LRAD with CGA PT Short Term Goal 4 (Week 2): Pt will ambulate at least 35f using LRAD with CGA PT Short Term Goal 5 (Week 2): Pt will demonstrate body awareness with wheelchair mobility, requiring verbal cuing <25% for safely navigating obstacles without making contact  Skilled Therapeutic Interventions/Progress Updates:    Pt received supine in bed and agreeable to therapy session. Reports not sleeping well last night due to not having CPAP donned until 2AM this morning. MD in/out for morning assessment during which pt became emotional reporting she hopes she continues living - provided emotional support and encouragement regarding her CLOF. Supine>sitting R EOB, HOB slightly elevated and relying heavily on R UE support on bedrail with supervision and increased time/effort. Pt requesting to wash-up EOB this morning.   Sitting EOB demonstrating good trunk control with use of R UE support on bedrail as needed while completing doffing UB clothing mod-I and donning with min assist for pulling down clothing on L side - completed UB bathing with set-up and then total assist for her back.   Sit>stands from EOB (23inch height) to RW 4x during bathing and dressing tasks using R UE support on bedrail to push to stand and LUE pushing off bed with CGA and increased time/effort to achieve full stand - sometimes requiring a seated rest prior to another attempt to come to stand. Educated pt on talking with family about her  bed height at home - pt confirms she has bedrail.  Standing completed LB clothing management on/off hips with min assist to pull up clothing over L hip - CGA/close supervision for balance throughout with pt using either RW, bedrail, or backs of legs against bed to maintain her balance.  Short distance ~381fambulatory transfer EOB>pt's personal power w/c using RW with CGA - pt had 1x R knee slightly gave way but pt able to maintain balance in standing without increased assist. Sitting in power w/c performed oral care without assist but increased time due to impaired coordination of UEs. At end of session pt left seated in power w/c with needs in reach and seat belt on.   Therapy Documentation Precautions:  Precautions Precautions: Fall, Other (comment) Precaution Comments: watch gait belt placement - insulin pump Rt abdomen Restrictions Weight Bearing Restrictions: No   Pain:  No reports of pain throughout session. Nurse present during session for morning medication administration.    Therapy/Group: Individual Therapy  CaTawana Scale PT, DPT, NCS, CSRS 08/20/2021, 7:50 AM

## 2021-08-20 NOTE — Progress Notes (Signed)
Occupational Therapy Session Note  Patient Details  Name: Hayzlee Enwright MRN: [id number] Date of Birth: 1951/03/09  Today's Date: 08/21/2021 OT Group Time: 1100-1200 OT Group Time Calculation (min): 60 min  Skilled Therapeutic Interventions/Progress Updates:    Pt engaged in therapeutic w/c level dance group focusing on patient choice, UE/LE strengthening, salience, activity tolerance, and social participation. Pt was guided through various dance-based exercises involving UEs/LEs and trunk. All music was selected by group members. Emphasis placed on Lt NMR and activity tolerance. Pt incorporated her Lt side throughout session with min cues, utilized active assist ROM for the Lt arm at times given demonstrational cuing. She declined standing but was provided with the opportunity. At end of session she returned to the room via [place] with OT.    Therapy Documentation Precautions:  Precautions Precautions: Fall, Other (comment) Precaution Comments: watch gait belt placement - insulin pump Rt abdomen Restrictions Weight Bearing Restrictions: No  Pain: no s/s pain during tx   ADL: ADL Eating: Set up Where Assessed-Eating: Edge of bed Grooming: Supervision/safety Where Assessed-Grooming: Edge of bed Upper Body Bathing: Supervision/safety Where Assessed-Upper Body Bathing: Edge of bed Lower Body Bathing: Maximal assistance Where Assessed-Lower Body Bathing: Edge of bed Upper Body Dressing: Moderate assistance Where Assessed-Upper Body Dressing: Edge of bed Lower Body Dressing: Maximal assistance Where Assessed-Lower Body Dressing: Edge of bed Toileting: Maximal assistance Where Assessed-Toileting: Bedside Commode Toilet Transfer: Moderate assistance Toilet Transfer Method: Stand pivot Toilet Transfer Equipment: Extra wide bedside commode Tub/Shower Transfer: Not assessed Gaffer Transfer: Not assessed      Therapy/Group: Group Therapy  Alister Staver A  Jabier Deese 08/21/2021, 12:40 PM

## 2021-08-21 LAB — GLUCOSE, CAPILLARY
Glucose-Capillary: 243 mg/dL — ABNORMAL HIGH (ref 70–99)
Glucose-Capillary: 243 mg/dL — ABNORMAL HIGH (ref 70–99)
Glucose-Capillary: 358 mg/dL — ABNORMAL HIGH (ref 70–99)
Glucose-Capillary: 187 mg/dL — ABNORMAL HIGH (ref 70–99)

## 2021-08-21 NOTE — Progress Notes (Signed)
Patient changed insulin pump today with nurse supervising task.  Abdominal site remains clean and dry.

## 2021-08-22 DIAGNOSIS — E1169 Type 2 diabetes mellitus with other specified complication: Secondary | ICD-10-CM

## 2021-08-22 DIAGNOSIS — I5032 Chronic diastolic (congestive) heart failure: Secondary | ICD-10-CM

## 2021-08-22 DIAGNOSIS — E669 Obesity, unspecified: Secondary | ICD-10-CM

## 2021-08-22 LAB — CBC WITH DIFFERENTIAL/PLATELET
Abs Immature Granulocytes: 0 10*3/uL (ref 0.00–0.07)
Basophils Absolute: 0 10*3/uL (ref 0.0–0.1)
Basophils Relative: 1 %
Eosinophils Absolute: 0.1 10*3/uL (ref 0.0–0.5)
Eosinophils Relative: 3 %
HCT: 36.4 % (ref 36.0–46.0)
Hemoglobin: 11.4 g/dL — ABNORMAL LOW (ref 12.0–15.0)
Immature Granulocytes: 0 %
Lymphocytes Relative: 41 %
Lymphs Abs: 1.5 10*3/uL (ref 0.7–4.0)
MCH: 28.3 pg (ref 26.0–34.0)
MCHC: 31.3 g/dL (ref 30.0–36.0)
MCV: 90.3 fL (ref 80.0–100.0)
Monocytes Absolute: 0.6 10*3/uL (ref 0.1–1.0)
Monocytes Relative: 15 %
Neutro Abs: 1.4 10*3/uL — ABNORMAL LOW (ref 1.7–7.7)
Neutrophils Relative %: 40 %
Platelets: 175 10*3/uL (ref 150–400)
RBC: 4.03 MIL/uL (ref 3.87–5.11)
RDW: 13.1 % (ref 11.5–15.5)
WBC: 3.6 10*3/uL — ABNORMAL LOW (ref 4.0–10.5)
nRBC: 0 % (ref 0.0–0.2)

## 2021-08-22 LAB — BASIC METABOLIC PANEL
Anion gap: 8 (ref 5–15)
BUN: 17 mg/dL (ref 8–23)
CO2: 26 mmol/L (ref 22–32)
Calcium: 9 mg/dL (ref 8.9–10.3)
Chloride: 102 mmol/L (ref 98–111)
Creatinine, Ser: 1.19 mg/dL — ABNORMAL HIGH (ref 0.44–1.00)
GFR, Estimated: 49 mL/min — ABNORMAL LOW (ref >60)
Glucose, Bld: 232 mg/dL — ABNORMAL HIGH (ref 70–99)
Potassium: 4.4 mmol/L (ref 3.5–5.1)
Sodium: 136 mmol/L (ref 135–145)

## 2021-08-22 LAB — GLUCOSE, CAPILLARY
Glucose-Capillary: 206 mg/dL — ABNORMAL HIGH (ref 70–99)
Glucose-Capillary: 167 mg/dL — ABNORMAL HIGH (ref 70–99)
Glucose-Capillary: 208 mg/dL — ABNORMAL HIGH (ref 70–99)
Glucose-Capillary: 205 mg/dL — ABNORMAL HIGH (ref 70–99)

## 2021-08-22 NOTE — Progress Notes (Signed)
PROGRESS NOTE   Subjective/Complaints:  In good spirits. Asked if she could have ocuvite for her eye health. Otherwise happy with progress, team, etc  ROS: Patient denies fever, rash, sore throat, blurred vision, nausea, vomiting, diarrhea, cough, shortness of breath or chest pain, joint or back pain, headache, or mood change.    Objective:   No results found. Recent Labs    08/22/21 0617  WBC 3.6*  HGB 11.4*  HCT 36.4  PLT 175    Recent Labs    08/22/21 0617  NA 136  K 4.4  CL 102  CO2 26  GLUCOSE 232*  BUN 17  CREATININE 1.19*  CALCIUM 9.0     Intake/Output Summary (Last 24 hours) at 08/22/2021 1026 Last data filed at 08/22/2021 0700 Gross per 24 hour  Intake 472 ml  Output --  Net 472 ml     Pressure Injury 08/10/21 Heel Left;Lateral Unstageable - Full thickness tissue loss in which the base of the injury is covered by slough (yellow, tan, gray, green or brown) and/or eschar (tan, brown or black) in the wound bed. (Active)  08/10/21   Location: Heel  Location Orientation: Left;Lateral  Staging: Unstageable - Full thickness tissue loss in which the base of the injury is covered by slough (yellow, tan, gray, green or brown) and/or eschar (tan, brown or black) in the wound bed.  Wound Description (Comments):   Present on Admission: Yes    Physical Exam: Vital Signs Blood pressure (!) 140/59, pulse 71, temperature 98.3 F (36.8 C), temperature source Oral, resp. rate 16, height '5\' 7"'$  (1.702 m), weight 119.5 kg, SpO2 98 %.     Constitutional: No distress . Vital signs reviewed. obese HEENT: NCAT, EOMI, oral membranes moist Neck: supple Cardiovascular: RRR without murmur. No JVD    Respiratory/Chest: CTA Bilaterally without wheezes or rales. Normal effort    GI/Abdomen: BS +, non-tender, non-distended Ext: no clubbing, cyanosis, or edema Psych: pleasant and cooperative  Neurologic: Cranial nerves  II through XII intact, motor strength is grossly 4/5 in bilateral deltoid, bicep, tricep, grip, hip flexor, knee extensors, ankle dorsiflexor and plantar flexor Sensory exam normal sensation to light touch and proprioception in bilateral upper and lower extremities Musculoskeletal: Full range of motion in all 4 extremities. No joint swelling   Assessment/Plan: 1. Functional deficits which require 3+ hours per day of interdisciplinary therapy in a comprehensive inpatient rehab setting. Physiatrist is providing close team supervision and 24 hour management of active medical problems listed below. Physiatrist and rehab team continue to assess barriers to discharge/monitor patient progress toward functional and medical goals  Care Tool:  Bathing    Body parts bathed by patient: Right arm, Left arm, Chest, Abdomen, Right upper leg, Left upper leg, Face, Left lower leg, Right lower leg, Front perineal area   Body parts bathed by helper: Left lower leg, Right lower leg, Buttocks Body parts n/a: Buttocks   Bathing assist Assist Level: Minimal Assistance - Patient > 75%     Upper Body Dressing/Undressing Upper body dressing   What is the patient wearing?: Pull over shirt, Bra    Upper body assist Assist Level: Supervision/Verbal cueing  Lower Body Dressing/Undressing Lower body dressing      What is the patient wearing?: Pants, Underwear/pull up     Lower body assist Assist for lower body dressing: Maximal Assistance - Patient 25 - 49%     Toileting Toileting    Toileting assist Assist for toileting: Minimal Assistance - Patient > 75%     Transfers Chair/bed transfer  Transfers assist     Chair/bed transfer assist level: Contact Guard/Touching assist Chair/bed transfer assistive device: Walker, Armrests   Locomotion Ambulation   Ambulation assist      Assist level: Minimal Assistance - Patient > 75% Assistive device: Walker-rolling Max distance: 8'   Walk 10  feet activity   Assist     Assist level: Minimal Assistance - Patient > 75% Assistive device: Walker-rolling   Walk 50 feet activity   Assist Walk 50 feet with 2 turns activity did not occur: Safety/medical concerns  Assist level: Minimal Assistance - Patient > 75% Assistive device: Walker-rolling    Walk 150 feet activity   Assist Walk 150 feet activity did not occur: Safety/medical concerns         Walk 10 feet on uneven surface  activity   Assist Walk 10 feet on uneven surfaces activity did not occur: Safety/medical concerns         Wheelchair     Assist Is the patient using a wheelchair?: Yes Type of Wheelchair: Power    Wheelchair assist level: Supervision/Verbal cueing, Set up assist Max wheelchair distance: 275f    Wheelchair 50 feet with 2 turns activity    Assist        Assist Level: Supervision/Verbal cueing   Wheelchair 150 feet activity     Assist      Assist Level: Supervision/Verbal cueing   Blood pressure (!) 140/59, pulse 71, temperature 98.3 F (36.8 C), temperature source Oral, resp. rate 16, height '5\' 7"'$  (1.702 m), weight 119.5 kg, SpO2 98 %.  Medical Problem List and Plan: 1.  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  Antithrombotics: -DVT/anticoagulation: Eliquis Mechanical: Sequential compression devices, below knee Bilateral lower extremities Pharmaceutical: Lovenox             -antiplatelet therapy: N/A 3. Pain Management: Neurontin 100 mg 3 times daily             Monitor with increased exertion 4. Mood: Prozac 30 mg daily             -antipsychotic agents: N/A 5. Neuropsych: This patient is capable of  making decisions on her own behalf. 6. Skin/Wound Care/W OC follow-up: Routine skin checks.  Left heel and right shin wounds 7. Fluids/Electrolytes/Nutrition: Routine in and outs             8/29 I personally reviewed the patient's labs today.     -Cr stable to improved. Probably baseline insufficiency      -pt is on equivalent of ocuvite (prosight)  8.  Hypertension.  Norvasc 10 mg daily.         Vitals:  08/21/21 2244 08/22/21 0338  BP:  (!) 140/59  Pulse: 69 71  Resp: 18 16  Temp:    SpO2: 94% 98%   8/29- BP controlled- con't regimen 9.  History of atrial fibrillation status post pacemaker.  Cardiac rate controlled.  Continue Eliquis 10.  Diabetes mellitus with peripheral neuropathy.  Patient does have an insulin pump that is being utilized          CBG (last 3)  Recent Labs    08/21/21 1605 08/21/21 2047 08/22/21 0601  GLUCAP 358* 187* 206*  Elevated diabetes coordinator to assist with pump settings Advised pt to increase basal rate to 1.5 U per hr, pt has done this - improving - has reduced basal rate at night to avoid hypoglycemia   8/25- BG's [id number]- will see if DM coordinators have any other suggestions.  Have called- they will look into it.   8/27- DM coordinators don't feel comfortable changing her Insulin pump- suggested causing Endo, however BG's looking better- con't to monitor  8/29 labile readings. Will ask endocrine or FP for recs 11.  Diastolic congestive heart failure.  Demadex 10 mg daily.  Monitor for any signs of fluid overload   Filed Weights   08/20/21 0458 08/21/21 0500 08/22/21 0500  Weight: 118.7 kg 119 kg 119.5 kg      8/29 -weight stable at 118-119 kg- con't to monitor 12.  Hyperlipidemia.  Lovaza/Lipitor 13.  Hypothyroidism.  Synthroid 14.  GERD.  Protonix 15.  Morbid obesity.  BMI 42.02.  Dietary follow-up.  Encourage weight loss     17.  OSA.  BiPAP  8/27- pt reports not getting BiPAP put on her; or help with BiPAP from resp, last 2 days-  Friday night after 2am- no documentation from Resp therapy to know.     LOS: 12 days A FACE TO [place] 08/22/2021, 10:26 AM

## 2021-08-22 NOTE — Progress Notes (Signed)
Patient ID: [name], female   DOB: 04-17-1951, 70 y.o.   MRN: [id number]  Family education scheduled with pt sister on Wednesday 8-31, 8-12  [place], [place]

## 2021-08-22 NOTE — Progress Notes (Signed)
Physical Therapy Session Note  Patient Details  Name: [name] MRN: [id number] Date of Birth: 1951-07-24  Today's Date: 08/22/2021 PT Individual Time: 1300-1400 PT Individual Time Calculation (min): 60 min   Short Term Goals: Week 2:  PT Short Term Goal 1 (Week 2): Pt will perform supine<>sit using bedrail with CGA PT Short Term Goal 2 (Week 2): Pt will perform bed<>chair transfers using LRAD with CGA PT Short Term Goal 3 (Week 2): Pt will perform bed<>chair transfers using LRAD with CGA PT Short Term Goal 4 (Week 2): Pt will ambulate at least 48f using LRAD with CGA PT Short Term Goal 5 (Week 2): Pt will demonstrate body awareness with wheelchair mobility, requiring verbal cuing <25% for safely navigating obstacles without making contact  Skilled Therapeutic Interventions/Progress Updates: Pt presents sitting in power chair and agreeable to therapy.  Pt negotiated w/c in room and through crowded hallway w/ supervision, occasional verbal cues for speed in confined space and to avoid objects to right > left.  Pt amb multiple trials of 35' w/ RW and min A.  Pt requires occasional verbal cues during gait for upright posture to improve step-through gait pattern and increased cues for safe turns and approach to seat.  Pt required seated rest breaks 2/2 fatigue.  Pt performed standing balance activities w/ peg board matching pictures.  Pt performing while crossing midline and then when using BUEs simultaneously.  Pt transfers sit to stand w/ CGA and verbal reminders for sequencing, especially forward lean.  Pt wheeled back to room and remained sitting in w/c w/ chair alarm on and all needs in reach.     Therapy Documentation Precautions:  Precautions Precautions: Fall, Other (comment) Precaution Comments: watch gait belt placement - insulin pump Rt abdomen Restrictions Weight Bearing Restrictions: No General:   Vital Signs: Therapy Vitals Temp: 98.4 F (36.9 C) Temp Source:  Oral Pulse Rate: 60 Resp: 16 BP: (!) 127/52 Patient Position (if appropriate): Sitting Oxygen Therapy SpO2: 100 % O2 Device: Room Air Pain:0/10   Mobility:      Therapy/Group: Individual Therapy  JLadoris Gene8/29/2022, 2:02 PM

## 2021-08-22 NOTE — Progress Notes (Signed)
Occupational Therapy Session Note  Patient Details  Name: [name] MRN: [id number] Date of Birth: [birth year]  Today's Date: 08/22/2021 OT Individual Time: [id number] OT Individual Time Calculation (min): 71 min    Short Term Goals: Week 2:  OT Short Term Goal 1 (Week 2): Pt will complete LB dressing with min assist using AE PRN. OT Short Term Goal 2 (Week 2): Pt will complete toilet transfer with min assist using the RW and 3:1. OT Short Term Goal 3 (Week 2): Pt will complete UB dressing with supervision for two consecutive sessions.  Skilled Therapeutic Interventions/Progress Updates:    Pt in bed to start session, requesting to transfer to the toilet.  She needed min assist for supine to sit with min assist for sit to stand and functional mobility to the toilet with use of the RW.  Min assist for clothing management and toilet hygiene sit to stand as well.  She then completed ambulation out to her wheelchair for bathing and dressing tasks at the sink.  Supervision for UB bathing with mod assist for LB bathing secondary to not being able to efficiently reach her buttocks with therapist assisting.  She was able to complete donning sports bra and pullover shirt with min assist.  Min assist was also needed for donning her underpants and pants as well with use of the reacher for threading items over her LEs.  Finished session with completion of oral hygiene at modified independent level from the power wheelchair.  Call button and phone in reach with safety belt in place at end of session.   Therapy Documentation Precautions:  Precautions Precautions: Fall, Other (comment) Precaution Comments: watch gait belt placement - insulin pump Rt abdomen Restrictions Weight Bearing Restrictions: No  Pain: Pain Assessment Pain Scale: Faces Pain Score: 0-No pain ADL: See Care Tool Section for some details of mobility and selfcare  Therapy/Group: Individual Therapy  Christopher Glasscock  OTR/L 08/22/2021, 10:29 AM

## 2021-08-22 NOTE — Progress Notes (Signed)
Occupational Therapy Session Note  Patient Details  Name: [name] MRN: [id number] Date of Birth: 03-Oct-1951  Today's Date: 08/22/2021 OT Group Time: [id number] OT Group Time Calculation (min): 60 min   Short Term Goals: Week 2:  OT Short Term Goal 1 (Week 2): Pt will complete LB dressing with min assist using AE PRN. OT Short Term Goal 2 (Week 2): Pt will complete toilet transfer with min assist using the RW and 3:1. OT Short Term Goal 3 (Week 2): Pt will complete UB dressing with supervision for two consecutive sessions.  Skilled Therapeutic Interventions/Progress Updates:  Pt participate in group session with a focus on therapeutic activity of "corn hole" to facilitate magagement of w/c, hand-eye- coordination, dynamic sitting balance, UB coordination, social interaction, and increasing activity tolerance. Pt completed corn hold turns from sitting in w/c with pt alternating between using RUE and LUE ( mostly preferring to use RUE), supervision needed for dynamic sitting balance from w/c level. Worked on memory with pt recalling whose turn was next. Problem solving added into activity by pt having to determine how many points she would receive after each turn. Pt required MOD cues to recall needed to turn off [place] when reaching out od BOS. Pt able to write pts score on score board with RUE. Education provided on how to retrieve bean bags from board if RT was not available to retrieve bags. Pt actively interacting with other group members by  providing encouragement to other group members. Education provided on benefits of continuing to engage in meaningful and new activities and adapting activities as needed. Pt transported back to room by this OTA where pt was left seated in PWC with safety belt activated and all needs within reach.   Therapy Documentation Precautions:  Precautions Precautions: Fall, Other (comment) Precaution Comments: watch gait belt placement - insulin pump  Rt abdomen Restrictions Weight Bearing Restrictions: No  Pain: Pt reports no pain during group session.    Therapy/Group: Group Therapy  [name] 08/22/2021, 3:43 PM

## 2021-08-23 LAB — GLUCOSE, CAPILLARY
Glucose-Capillary: 164 mg/dL — ABNORMAL HIGH (ref 70–99)
Glucose-Capillary: 193 mg/dL — ABNORMAL HIGH (ref 70–99)
Glucose-Capillary: 263 mg/dL — ABNORMAL HIGH (ref 70–99)
Glucose-Capillary: 139 mg/dL — ABNORMAL HIGH (ref 70–99)

## 2021-08-23 MED ORDER — TRAMADOL HCL 50 MG PO TABS
50.0000 mg | ORAL_TABLET | Freq: Two times a day (BID) | ORAL | Status: DC | PRN
Start: 2021-08-23 — End: 2021-08-30
  Administered 2021-08-23 – 2021-08-28 (×5): 50 mg via ORAL
  Filled 2021-08-23 (×6): qty 1

## 2021-08-23 NOTE — Progress Notes (Signed)
PROGRESS NOTE   Subjective/Complaints:  Pt reports her arthritis is really bad- esp in legs- tylenol not helpful- explained that cannot give NSAIDs to help her "move" better, but can try Tramadol low dose 50 mg BID prn- but won't help stiffness, just pain.   ROS:  Pt denies SOB, abd pain, CP, N/V/C/D, and vision changes   Objective:   No results found. Recent Labs    08/22/21 0617  WBC 3.6*  HGB 11.4*  HCT 36.4  PLT 175    Recent Labs    08/22/21 0617  NA 136  K 4.4  CL 102  CO2 26  GLUCOSE 232*  BUN 17  CREATININE 1.19*  CALCIUM 9.0     Intake/Output Summary (Last 24 hours) at 08/23/2021 1020 Last data filed at 08/23/2021 0806 Gross per 24 hour  Intake 712 ml  Output --  Net 712 ml     Pressure Injury 08/10/21 Heel Left;Lateral Unstageable - Full thickness tissue loss in which the base of the injury is covered by slough (yellow, tan, gray, green or brown) and/or eschar (tan, brown or black) in the wound bed. (Active)  08/10/21   Location: Heel  Location Orientation: Left;Lateral  Staging: Unstageable - Full thickness tissue loss in which the base of the injury is covered by slough (yellow, tan, gray, green or brown) and/or eschar (tan, brown or black) in the wound bed.  Wound Description (Comments):   Present on Admission: Yes    Physical Exam: Vital Signs Blood pressure 125/74, pulse 88, temperature 98.2 F (36.8 C), temperature source Oral, resp. rate 19, height '5\' 7"'$  (1.702 m), weight 119.5 kg, SpO2 98 %.      General: awake, alert, appropriate, sitting up in bed; concerned about pain/arthritis/stiffness' NAD HENT: conjugate gaze; oropharynx moist CV: regular rate; no JVD Pulmonary: CTA B/L; no W/R/R- good air movement GI: soft, NT, ND, (+)BS Psychiatric: appropriate Neurological: Ox3  Ext: no clubbing, cyanosis, or edema Psych: pleasant and cooperative  Neurologic: Cranial nerves II  through XII intact, motor strength is grossly 4/5 in bilateral deltoid, bicep, tricep, grip, hip flexor, knee extensors, ankle dorsiflexor and plantar flexor Sensory exam normal sensation to light touch and proprioception in bilateral upper and lower extremities Musculoskeletal: Full range of motion in all 4 extremities. No joint swelling   Assessment/Plan: 1. Functional deficits which require 3+ hours per day of interdisciplinary therapy in a comprehensive inpatient rehab setting. Physiatrist is providing close team supervision and 24 hour management of active medical problems listed below. Physiatrist and rehab team continue to assess barriers to discharge/monitor patient progress toward functional and medical goals  Care Tool:  Bathing    Body parts bathed by patient: Right arm, Left arm, Chest, Abdomen, Right upper leg, Left upper leg, Face, Left lower leg, Right lower leg, Front perineal area   Body parts bathed by helper: Left lower leg, Right lower leg, Buttocks Body parts n/a: Buttocks   Bathing assist Assist Level: Minimal Assistance - Patient > 75%     Upper Body Dressing/Undressing Upper body dressing   What is the patient wearing?: Pull over shirt, Bra    Upper body assist Assist Level: Supervision/Verbal  cueing    Lower Body Dressing/Undressing Lower body dressing      What is the patient wearing?: Pants, Underwear/pull up     Lower body assist Assist for lower body dressing: Maximal Assistance - Patient 25 - 49%     Toileting Toileting    Toileting assist Assist for toileting: Minimal Assistance - Patient > 75%     Transfers Chair/bed transfer  Transfers assist     Chair/bed transfer assist level: Contact Guard/Touching assist Chair/bed transfer assistive device: Walker, Clinical biochemist   Ambulation assist      Assist level: Minimal Assistance - Patient > 75% Assistive device: Walker-rolling Max distance: 35   Walk 10  feet activity   Assist     Assist level: Minimal Assistance - Patient > 75% Assistive device: Walker-rolling   Walk 50 feet activity   Assist Walk 50 feet with 2 turns activity did not occur: Safety/medical concerns  Assist level: Minimal Assistance - Patient > 75% Assistive device: Walker-rolling    Walk 150 feet activity   Assist Walk 150 feet activity did not occur: Safety/medical concerns         Walk 10 feet on uneven surface  activity   Assist Walk 10 feet on uneven surfaces activity did not occur: Safety/medical concerns         Wheelchair     Assist Is the patient using a wheelchair?: Yes Type of Wheelchair: Power    Wheelchair assist level: Supervision/Verbal cueing, Set up assist Max wheelchair distance: 262f    Wheelchair 50 feet with 2 turns activity    Assist        Assist Level: Supervision/Verbal cueing   Wheelchair 150 feet activity     Assist      Assist Level: Supervision/Verbal cueing   Blood pressure 125/74, pulse 88, temperature 98.2 F (36.8 C), temperature source Oral, resp. rate 19, height '5\' 7"'$  (1.702 m), weight 119.5 kg, SpO2 98 %.  Medical Problem List and Plan: 1.  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  Antithrombotics: -DVT/anticoagulation: Eliquis Mechanical: Sequential compression devices, below knee Bilateral lower extremities Pharmaceutical: Lovenox             -antiplatelet therapy: N/A 3. Pain Management: Neurontin 100 mg 3 times daily  8/30- will try tramadol 50 mg BID prn for severe pain- explained won't help stiffness, but can help pain.              Monitor with increased exertion 4.  Mood: Prozac 30 mg daily             -antipsychotic agents: N/A 5. Neuropsych: This patient is capable of making decisions on her own behalf. 6. Skin/Wound Care/W OC follow-up: Routine skin checks.  Left heel and right shin wounds 7. Fluids/Electrolytes/Nutrition: Routine in and outs             8/29 I personally reviewed the patient's labs today.     -Cr stable to improved. Probably baseline insufficiency      -pt  is on equivalent of ocuvite (prosight)  8.  Hypertension.  Norvasc 10 mg daily.         Vitals:   08/22/21 2322 08/23/21 0331  BP:  125/74  Pulse: 68 88  Resp: 16 19  Temp:  98.2 F (36.8 C)  SpO2: 96% 98%   8/29- BP controlled- con't regimen 9.  History of atrial fibrillation status post pacemaker.  Cardiac rate controlled.  Continue Eliquis  8/30- cannot use NSADIS due to Eliquis- FYI- told pt.  10.  Diabetes mellitus with peripheral neuropathy.  Patient does have an insulin pump that is being utilized          CBG (last 3)  Recent Labs    08/22/21 1620 08/22/21 2054 08/23/21 0532  GLUCAP 208* 205* 164*  Elevated diabetes coordinator to assist with pump settings Advised pt to increase basal rate to 1.5 U per hr, pt has done this - improving - has reduced basal rate at night to avoid hypoglycemia   8/25- BG's 159-253- will see if DM coordinators have any other suggestions.  Have called- they will look into it.   8/27- DM coordinators don't feel comfortable changing her Insulin pump- suggested causing Endo, however BG's looking better- con't to monitor  8/30- will ask pt to have Endo give her suggestions for insulin pump 11.  Diastolic congestive heart failure.  Demadex 10 mg daily.  Monitor for any signs of fluid overload   Filed Weights   08/20/21 0458 08/21/21 0500 08/22/21 0500  Weight: 118.7 kg 119 kg 119.5 kg      8/29 -weight stable at 118-119 kg- con't to monitor 12.  Hyperlipidemia.  Lovaza/Lipitor 13.  Hypothyroidism.  Synthroid 14.  GERD.   Protonix 15.  Morbid obesity.  BMI 42.02.  Dietary follow-up.  Encourage weight loss     17.  OSA.  BiPAP  8/27- pt reports not getting BiPAP put on her; or help with BiPAP from resp, last 2 days- Friday night after 2am- no documentation from Resp therapy to know.     LOS: 13 days A FACE TO FACE EVALUATION WAS PERFORMED  Junaid Wurzer 08/23/2021, 10:20 AM

## 2021-08-23 NOTE — Progress Notes (Signed)
Physical Therapy Session Note  Patient Details  Name: [name] MRN: [id number] Date of Birth: 11-02-1951  Today's Date: 08/23/2021 PT Individual Time: 0807-0903 PT Individual Time Calculation (min): 56 min   Short Term Goals: Week 2:  PT Short Term Goal 1 (Week 2): Pt will perform supine<>sit using bedrail with CGA PT Short Term Goal 2 (Week 2): Pt will perform bed<>chair transfers using LRAD with CGA PT Short Term Goal 3 (Week 2): Pt will perform bed<>chair transfers using LRAD with CGA PT Short Term Goal 4 (Week 2): Pt will ambulate at least 88f using LRAD with CGA PT Short Term Goal 5 (Week 2): Pt will demonstrate body awareness with wheelchair mobility, requiring verbal cuing <25% for safely navigating obstacles without making contact  Skilled Therapeutic Interventions/Progress Updates:    Pt received supine in bed and agreeable to therapy session. Reports need to use bathroom. Supine>sitting R EOB, HOB elevated and using bedrail, with distant supervision. Sit>stand elevated EOB>RW, pushing up with R UE from bedrail, with CGA for safety (continues to use backs of legs against bed for balance/support while coming to stand). Gait training ~162f2x in/out bathroom using RW with CGA for steadying - pt continues to have impaired B LE coordination during gait, wide BOS, decreased R LE step length (step-to pattern leading with L LE in this smaller environment), slow gait speed with pauses during double limb support to regain balance, and anterior lean throughout - sequential cuing for AD management and LE stepping over bathroom doorway threshold for improved safety and balance. Standing with L UE support on grab bar pulled pants down with min assist for L side. Continent of bowel and bladder - completed peri-care with set-up assist for anterior and total assist for posterior. Once returned to EOB pt requests to wash-up and put on clean clothing.  Sit<>stands multiple times from  EOB<>RW with CGA for steadying throughout as described above. During bathing tasks pt able to maintain dynamic trunk control using  RUE support on bedrail as needed with supervision. Requires assist to pull up LB clothing over L hip but otherwise when given reacher can perform with set-up assist.   Short distance ambulatory transfer EOB>pt's personal power w/c using RW with slight min assist due to some R knee instability causing minor increased postural sway otherwise steps as described above. Pt left seated in power w/c at sink with needs in reach and nursing staff present to assume care of patient.  Therapy Documentation Precautions:  Precautions Precautions: Fall, Other (comment) Precaution Comments: watch gait belt placement - insulin pump Rt abdomen Restrictions Weight Bearing Restrictions: No   Pain:  Reports increased "stiffness" this morning due to her "arthritis"   Therapy/Group: Individual Therapy  CaTawana Scale PT, DPT, NCS, CSRS 08/23/2021, 7:44 AM

## 2021-08-23 NOTE — Progress Notes (Signed)
Physical Therapy Session Note  Patient Details  Name: [name] MRN: [id number] Date of Birth: 04-16-1951  Today's Date: 08/23/2021 PT Individual Time: 1000-1028 + [id number] PT Individual Time Calculation (min): 28 min  + 55 min  Short Term Goals: Week 2:  PT Short Term Goal 1 (Week 2): Pt will perform supine<>sit using bedrail with CGA PT Short Term Goal 2 (Week 2): Pt will perform bed<>chair transfers using LRAD with CGA PT Short Term Goal 3 (Week 2): Pt will perform bed<>chair transfers using LRAD with CGA PT Short Term Goal 4 (Week 2): Pt will ambulate at least 12f using LRAD with CGA PT Short Term Goal 5 (Week 2): Pt will demonstrate body awareness with wheelchair mobility, requiring verbal cuing <25% for safely navigating obstacles without making contact  Skilled Therapeutic Interventions/Progress Updates:     1st session: [place] - no reports of pain and agreeable to therapy. She [place] herself in her [place] from her room to day room rehab gym, ~2573f Worked on gait training where she ambulated ~309f 45f61fth CGA and RW but requires minA for sit<>stand to RW from her P[place] low sitting arm chair. She has requires mod cues for safety approach to sitting surfaces, making sure she steps backwards until she's close to the chair and keeping her body within walker frame during approach as well. She drove herself back to her room in [place] manner as above - able to 3-point park herself in her room with supervision. Belt alarm on, all needs in reach at conclusion of session.   2nd session: [place], agreeable to PT tx. Reports on pain but does endorse generalized fatigue from busy day of therapies. Pt drives herself in P[place] rehab gym with supervision, >300ft60fmpletes stand<>pivot transfer with minA and RW to mat table - required assist at [facility] for setup - she reports she uses a reacher to  remove platform when performing sit<>stands to bed<>chair transfers. Ambulatory transfer with RW and CGA to the car simulator, required minA for turning and safety approach to car. Required modA for BLE management into and out of the car due to limited ability to clear feet over threshold. Completed ambulatory transfer back to mat table with CGA and RW. Worked on standing balance and global endurance with alternating toe taps to 3inch platform with BUE support to RW - required minA for balance and she had x1 large LOB posteriorly that was unable to be corrected, resulting in abrupt sitting to mat table. Decreased ability to laterally weight shift with combination of decreased ability to produce adequate hip/knee flexion to clear feet onto the 3inch platform - fearful of falling as well. Next, worked on gait with ambulating up/down 10ft 10fined ramp with RW - required minA for steadying while ambulating on ramp and CGA while on level surfaces.  Next, worked on [facility] with weaving in/out of cones spaced ~4ft ap31f. Initially she had difficulty and required min cues for taking wide turns to avoid running over cones, but this improved with practiced. Returned to her room where she remained seated in PWC [place] alarm on and all needs within reach.   Therapy Documentation Precautions:  Precautions Precautions: Fall, Other (comment) Precaution Comments: watch gait belt placement - insulin pump Rt abdomen Restrictions Weight Bearing Restrictions: No General:    Therapy/Group: Individual Therapy  ChristiAlger Simons022, 7:34  AM  

## 2021-08-23 NOTE — Progress Notes (Signed)
Occupational Therapy Session Note  Patient Details  Name: [name] MRN: [id number] Date of Birth: Aug 22, 1951  Today's Date: 08/23/2021 OT Individual Time: 1105-1203 OT Individual Time Calculation (min): 58 min    Short Term Goals: Week 2:  OT Short Term Goal 1 (Week 2): Pt will complete LB dressing with min assist using AE PRN. OT Short Term Goal 2 (Week 2): Pt will complete toilet transfer with min assist using the RW and 3:1. OT Short Term Goal 3 (Week 2): Pt will complete UB dressing with supervision for two consecutive sessions.  Skilled Therapeutic Interventions/Progress Updates:    Pt in power wheelchair to start session.  She was able to roll herself down to the ortho gym with supervision, using much better speed and control than in previous sessions.  Min assist transfer to the therapy mat was completed with transition into supine at min assist level as well.  Therapist completed stretching to the posterior capsule of the left shoulder with use of cross shoulder stretch as well as educating pt on completion of it.  Progressed to sidelying with completion of scapular mobilizations for adduction and depression.  Therapist then completed glenohumeral mobilizations with flexion and abduction to approximately 100-110 degrees.  Slight pain noted at this range.  Therapist then completed stretching to internal and external rotation with shoulder abduction around 90 degrees.  Limited ROM in both areas but some improvement noted with stretching.  Transition back to sitting with mod assist, rolling to right sidelying and transitioning up to sit.  Educated pt on AAROM/stretching for shoulder internal rotation with use of dowel rod and placing both hands behind her back.  She was able to use the RUE to assist with pulling the LUE further behind toward the middle of her back.  Finished session with transfer to the wheelchair at min assist stand pivot and then transition back to the room.   She was left sitting up for lunch with the call button and phone in reach.    Therapy Documentation Precautions:  Precautions Precautions: Fall, Other (comment) Precaution Comments: watch gait belt placement - insulin pump Rt abdomen Restrictions Weight Bearing Restrictions: No  Pain: Pain Assessment Pain Scale: Faces Pain Score: 0-No pain ADL: See Care Tool Section for some details of mobility and selfcare   Therapy/Group: Individual Therapy  Sava Proby OTR/L 08/23/2021, 12:57 PM

## 2021-08-24 DIAGNOSIS — E1142 Type 2 diabetes mellitus with diabetic polyneuropathy: Secondary | ICD-10-CM

## 2021-08-24 DIAGNOSIS — I10 Essential (primary) hypertension: Secondary | ICD-10-CM

## 2021-08-24 DIAGNOSIS — G4733 Obstructive sleep apnea (adult) (pediatric): Secondary | ICD-10-CM

## 2021-08-24 LAB — GLUCOSE, CAPILLARY
Glucose-Capillary: 252 mg/dL — ABNORMAL HIGH (ref 70–99)
Glucose-Capillary: 254 mg/dL — ABNORMAL HIGH (ref 70–99)
Glucose-Capillary: 196 mg/dL — ABNORMAL HIGH (ref 70–99)
Glucose-Capillary: 235 mg/dL — ABNORMAL HIGH (ref 70–99)

## 2021-08-24 NOTE — Progress Notes (Signed)
Patient ID: [name], female   DOB: 1951/12/16, 70 y.o.   MRN: [id number]  [facility] referral sent to [facility] per patient request  Erlene Quan, [place]

## 2021-08-24 NOTE — Progress Notes (Signed)
Recreational Therapy Session Note  Patient Details  Name: [name] MRN: [id number] Date of Birth: 28-Mar-1951 Today's Date: 08/24/2021  Pain: no c/o  Pt participated in animal assisted activity seated w/c level with supervision.  Family also present. Pt response:  Pt smiling and interactive with [employer] Team.  [place] 08/24/2021, 3:21 PM

## 2021-08-24 NOTE — Progress Notes (Signed)
Physical Therapy Session Note  Patient Details  Name: [name] MRN: [id number] Date of Birth: 08-27-1951  Today's Date: 08/24/2021 PT Individual Time: [id number] PT Individual Time Calculation (min): 65 min   Short Term Goals: Week 2:  PT Short Term Goal 1 (Week 2): Pt will perform supine<>sit using bedrail with CGA PT Short Term Goal 2 (Week 2): Pt will perform bed<>chair transfers using LRAD with CGA PT Short Term Goal 3 (Week 2): Pt will perform bed<>chair transfers using LRAD with CGA PT Short Term Goal 4 (Week 2): Pt will ambulate at least 60f using LRAD with CGA PT Short Term Goal 5 (Week 2): Pt will demonstrate body awareness with wheelchair mobility, requiring verbal cuing <25% for safely navigating obstacles without making contact  Skilled Therapeutic Interventions/Progress Updates:    Pt received sitting in personal power w/c with her sister, BPamala Hurry and niece, CDuwaine Maxin present for family education/training. Pt agreeable to therapy session. Educated family on pt's CLOF and need for assistance with transfers and ambulation using RW. Educated on use of gait belt. Family reports they are unable to provide 24hr support therefore recommended pt be at power wheelchair level in the home when family not present and only going to/from bathroom using grab bars - family described bathroom access with grab bars and sounds like a very appropriate, safe, and well equipped set-up. Educated on recommendation of HHPT at D/C but then educated on importance of progressing to [facility]PP[facility] when appropriate [facility]  Pt performed short distance ambulatory transfer power w/c>EOB using RW with CGA for steadying - continues to be slow to rise to stand with delayed anterior trunk lean and weight shift - continues to take slow steps with wide BOS and pause in double limb support to regain balance. Sit<>supine on bed with supervision using R bedrail - family confirmed pt has  bedrail support at home.  Sit>stand EOB>RW (EOB at 23inch height - attempted to stand from 22inch with pt having significantly increased difficulty requiring hands-on assist) - continues to push up with R UE on bedrail and use backs of legs against bed to assist with coming to stand despite scooting hips forwards towards EOB. Transfer back to power w/c as just described.  Power w/c mobility ~2041fx2 to/from therapy gym with supervision - improved control of speed and increased safety awareness.  Performed simulated car transfer (small SUV height) via stand pivot from power w/c and using B UE support on car with family stabilizing the car door and therapist provided close supervision/CGA. Educated on positioning of wheelchair to increase pt safety and independence with the transfer. Family demonstrates excellent knowledge of how to set-up and assist pt with this transfer safely.   Gait training 5063fsing RW with CGA progressed to min assist - continues to have impaired B LE coordination during gait, wide BOS, decreased R LE step length (varying step-to vs step-through pattern leading with L LE despite open environment), slow gait speed with pauses during double limb support to regain balance, and anterior lean throughout - cuing for improvement throughout.  Gait training ~45f68f ADL apartment on carpet to simulate home environment with CGA and educating pt//family on how to safely set-up and perform gait in the home with family assistance. Therapist educated on plan to provide pt with HEP prior to D/C. Throughout session educated family on pt's impaired proprioception/sensation impacting her balance resulting in increased fall risk. Pt/family report no additional questions/concerns and family reports pt appears to  move with increased confidence and increased strength in L LE compared to prior to hospitalization. At end of session pt remained sitting in her personal power w/c with needs in reach, seat  belt alarm on, and family present.    Therapy Documentation Precautions:  Precautions Precautions: Fall, Other (comment) Precaution Comments: watch gait belt placement - insulin pump Rt abdomen Restrictions Weight Bearing Restrictions: No  Pain:  No reports of pain throughout session.    Therapy/Group: Individual Therapy  Tawana Scale , PT, DPT, NCS, CSRS  08/24/2021, 7:55 AM

## 2021-08-24 NOTE — Progress Notes (Signed)
PROGRESS NOTE   Subjective/Complaints: Patient seen sitting up at the edge of the bed this morning, working with therapies.  Good sitting balance noted.  She states she slept well overnight.  She notes her only complaint is about the cleaning of her BiPAP machine.  Discussed with nursing.  Family at bedside.  ROS: Denies CP, SOB, N/V/D  Objective:   No results found. Recent Labs    08/22/21 0617  WBC 3.6*  HGB 11.4*  HCT 36.4  PLT 175     Recent Labs    08/22/21 0617  NA 136  K 4.4  CL 102  CO2 26  GLUCOSE 232*  BUN 17  CREATININE 1.19*  CALCIUM 9.0      Intake/Output Summary (Last 24 hours) at 08/24/2021 1129 Last data filed at 08/24/2021 0800 Gross per 24 hour  Intake 840 ml  Output --  Net 840 ml      Pressure Injury 08/10/21 Heel Left;Lateral Unstageable - Full thickness tissue loss in which the base of the injury is covered by slough (yellow, tan, gray, green or brown) and/or eschar (tan, brown or black) in the wound bed. (Active)  08/10/21   Location: Heel  Location Orientation: Left;Lateral  Staging: Unstageable - Full thickness tissue loss in which the base of the injury is covered by slough (yellow, tan, gray, green or brown) and/or eschar (tan, brown or black) in the wound bed.  Wound Description (Comments):   Present on Admission: Yes    Physical Exam: Vital Signs Blood pressure (!) 133/55, pulse 95, temperature 98 F (36.7 C), resp. rate 14, height '5\' 7"'$  (1.702 m), weight 119.5 kg, SpO2 98 %. Constitutional: No distress . Vital signs reviewed. HENT: Normocephalic.  Atraumatic. Eyes: EOMI. No discharge. Cardiovascular: No JVD.  RRR. Respiratory: Normal effort.  No stridor.  Bilateral clear to auscultation. GI: Non-distended.  BS +. Skin: Warm and dry.  Intact. Psych: Normal mood.  Normal behavior. Musc: No edema in extremities.  No tenderness in extremities. Neuro: Alert and  oriented Motor: Grossly 4/5 throughout Left upper extremity mild ataxia  Assessment/Plan: 1. Functional deficits which require 3+ hours per day of interdisciplinary therapy in a comprehensive inpatient rehab setting. Physiatrist is providing close team supervision and 24 hour management of active medical problems listed below. Physiatrist and rehab team continue to assess barriers to discharge/monitor patient progress toward functional and medical goals  Care Tool:  Bathing    Body parts bathed by patient: Right arm, Left arm, Chest, Abdomen, Right upper leg, Left upper leg, Face, Left lower leg, Right lower leg, Front perineal area   Body parts bathed by helper: Left lower leg, Right lower leg, Buttocks Body parts n/a: Buttocks   Bathing assist Assist Level: Minimal Assistance - Patient > 75%     Upper Body Dressing/Undressing Upper body dressing   What is the patient wearing?: Pull over shirt, Bra    Upper body assist Assist Level: Supervision/Verbal cueing    Lower Body Dressing/Undressing Lower body dressing      What is the patient wearing?: Pants, Underwear/pull up     Lower body assist Assist for lower body dressing: Maximal Assistance - Patient  25 - 49%     Toileting Toileting    Toileting assist Assist for toileting: Minimal Assistance - Patient > 75%     Transfers Chair/bed transfer  Transfers assist     Chair/bed transfer assist level: Minimal Assistance - Patient > 75% Chair/bed transfer assistive device: Armrests, Programmer, multimedia   Ambulation assist      Assist level: Contact Guard/Touching assist Assistive device: Walker-rolling Max distance: 58f   Walk 10 feet activity   Assist     Assist level: Minimal Assistance - Patient > 75% Assistive device: Walker-rolling   Walk 50 feet activity   Assist Walk 50 feet with 2 turns activity did not occur: Safety/medical concerns  Assist level: Minimal Assistance -  Patient > 75% Assistive device: Walker-rolling    Walk 150 feet activity   Assist Walk 150 feet activity did not occur: Safety/medical concerns         Walk 10 feet on uneven surface  activity   Assist Walk 10 feet on uneven surfaces activity did not occur: Safety/medical concerns         Wheelchair     Assist Is the patient using a wheelchair?: Yes Type of Wheelchair: Power    Wheelchair assist level: Supervision/Verbal cueing, Set up assist Max wheelchair distance: 2039f   Wheelchair 50 feet with 2 turns activity    Assist        Assist Level: Supervision/Verbal cueing   Wheelchair 150 feet activity     Assist      Assist Level: Supervision/Verbal cueing   Blood pressure (!) 133/55, pulse 95, temperature 98 F (36.7 C), resp. rate 14, height '5\' 7"'$  (1.702 m), weight 119.5 kg, SpO2 98 %.  Medical Problem List and Plan: 1.  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  Antithrombotics: -DVT/anticoagulation: Eliquis Mechanical: Sequential compression devices, below knee Bilateral lower extremities Pharmaceutical: Lovenox             -antiplatelet therapy: N/A 3. Pain Management: Neurontin 100 mg 3 times daily  8/30- will try tramadol 50 mg BID prn for severe pain- explained won't help stiffness, but can help pain.              Monitor with increased exertion 4. Mood: Prozac 30 mg daily             -antipsychotic agents: N/A 5. Neuropsych: This patient is capable of making decisions on her own behalf. 6. Skin/Wound Care/W OC follow-up: Routine skin checks.  Left heel and right shin wounds 7. Fluids/Electrolytes/Nutrition: Routine in and outs   -Cr stable to improved. Probably baseline insufficiency   Creatinine 1.19 on 8/29      -pt is on equivalent of  ocuvite (prosight)  8.  Hypertension.  Norvasc 10 mg daily.         Vitals:   08/23/21 1926 08/24/21 0341  BP: (!) 108/47 (!) 133/55  Pulse: (!) 59 95  Resp: 14 14  Temp: 97.8 F (36.6 C) 98 F (36.7 C)  SpO2:  98%    Relatively controlled on 8/31 9.  History of atrial fibrillation status post pacemaker.  Cardiac rate controlled.  Continue Eliquis  8/30- cannot use NSADIS due to Eliquis- FYI- told pt.  10.  Diabetes mellitus with peripheral neuropathy.  Patient does have an insulin pump that is being utilized          CBG (last 3)  Recent Labs    08/23/21 1637 08/23/21 2058 08/24/21 0549  GLUCAP 263* 139* 252*   Elevated diabetes coordinator to assist with pump settings Awaiting Endo Recs 11.  Diastolic congestive heart failure.  Demadex 10 mg daily.  Monitor for any signs of fluid overload   Filed Weights   08/20/21 0458 08/21/21 0500 08/22/21 0500  Weight: 118.7 kg 119 kg 119.5 kg     Stable on 8/29  12.  Hyperlipidemia.  Lovaza/Lipitor 13.  Hypothyroidism.  Synthroid 14.  GERD.  Protonix 15.  Morbid obesity.  BMI 42.02.  Dietary follow-up.  Encourage weight loss 17.  OSA.  BiPAP  Discussed with nursing cleaning of BiPAP   LOS: 14 days A FACE TO FACE EVALUATION WAS PERFORMED  Janalyn Higby Lorie Phenix 08/24/2021, 11:29 AM

## 2021-08-24 NOTE — Progress Notes (Signed)
Occupational Therapy Session Note  Patient Details  Name: [name] MRN: [id number] Date of Birth: [birth year]  Today's Date: 08/24/2021 OT Individual Time: [id number] OT Individual Time Calculation (min): 64 min    Short Term Goals: Week 2:  OT Short Term Goal 1 (Week 2): Pt will complete LB dressing with min assist using AE PRN. OT Short Term Goal 2 (Week 2): Pt will complete toilet transfer with min assist using the RW and 3:1. OT Short Term Goal 3 (Week 2): Pt will complete UB dressing with supervision for two consecutive sessions.  Skilled Therapeutic Interventions/Progress Updates:    Session 1: [phone]) Pt worked on bathing and dressing sit to stand from the EOB with her sister and another family member present.  She was able to complete UB selfcare with supervision for bathing and min assist for UB dressing.  AE was used for removal of underpants and pants as well as for donning new ones over her LE.  Min assist for sit to stand from the EOB during LB selfcare and for washing front peri area.  Max assist for thoroughness to wash the buttocks.  Min assist for sit to stand and for pulling underpants and pants over hips.  She was able to complete stand pivot transfer with the RW to the power wheelchair at min assist to complete session.  Discussed with family concerns over her being alone for toilet transfers when pt's sister is at work.  Feel she is still a high fall risk, but family has not plans to be able to provide assist during the day secondary to not having availability.  Call button and phone in reach with safety belt in place.    Session 2: [id number]) Pt navigated her power wheelchair down to the ADL kitchen with supervision during session.  Had her work on Management consultant prep from wheelchair level with use of the reacher for obtaining items from the floor and lower surfaces.  Also had her stand with use of the RW and min assist for placing items into the upper  cabinets.  Integrated use of the LUE as well with supervision to min assist secondary to limited shoulder flexion.  Had her use the RUE for upper/ middle shelves.  Had her simulate taking items from the refrigerator with use of the power chair as well as placing items in the microwave for simulated meal prep.  She was able to complete with supervision.  Finished session with return to the room where she was left sitting up with the call button and phone in reach and safety belt in place.    Therapy Documentation Precautions:  Precautions Precautions: Fall, Other (comment) Precaution Comments: watch gait belt placement - insulin pump Rt abdomen Restrictions Weight Bearing Restrictions: No  Pain: Pain Assessment Pain Scale: Faces Pain Score: 0-No pain ADL: See Care Tool Section for some details of mobility and selfcare   Therapy/Group: Individual Therapy  Boston Cookson OTR/L 08/24/2021, 11:43 AM

## 2021-08-24 NOTE — Progress Notes (Signed)
Occupational Therapy Weekly Progress Note  Patient Details  Name: [name] MRN: [id number] Date of Birth: [birth year]  Beginning of progress report period: August 18, 2021 End of progress report period: August 24, 2021   Patient has met 2 of 3 short term goals.  [name] is making steady progress with OT at this time.  She completes UB selfcare with supervision to min assist.  She continues to need occasional assist for pulling her sports bra down in the back.  LB bathing and dressing are completed at min assist with use of AE for donning clothing over her LEs and for standing to pull them up over her hips.  She is able to complete stand pivot transfers and short distance mobility with use of the RW at min assist level.  Decreased efficiency of movement with limited weightshifting is noted with robotic type gait pattern.  She continues to demonstrate decreased LUE functional use as well with limited AROM shoulder flexion 0-110 degrees as well as limited internal rotation affecting her ability to pull up her clothing after toileting or dressing tasks as well as for reaching overhead.  Feel she is on target to meet supervision level goals overall, however she will be alone during the day at home as her sister works.  Feel she will need supervision to stay as safe as possible and this has been expressed to the pt and her family.  Recommend continued CIR level therapy to progress to supervision level with only intermittent supervision to be provided.  Feel this is not safe as she is a moderate/high fall risk at this time.    Patient continues to demonstrate the following deficits: muscle weakness, muscle joint tightness, and muscle paralysis, impaired timing and sequencing, abnormal tone, unbalanced muscle activation, and decreased coordination, and decreased standing balance, decreased postural control, hemiplegia, and decreased balance strategies and therefore will continue to benefit from  skilled OT intervention to enhance overall performance with BADL and Reduce care partner burden.  Patient progressing toward long term goals..  Continue plan of care.  OT Short Term Goals Week 3:  OT Short Term Goal 1 (Week 3): Continue working on established LTGs set at supervision level overall.  Therapy Documentation Precautions:  Precautions Precautions: Fall, Other (comment) Precaution Comments: watch gait belt placement - insulin pump Rt abdomen Restrictions Weight Bearing Restrictions: No  Pain: No report of pain   Therapy/Group: Individual Therapy  Olga Bourbeau OTR/L 08/24/2021, 4:58 PM

## 2021-08-24 NOTE — Progress Notes (Signed)
Patient ID: [name], female   DOB: 1951/04/06, 70 y.o.   MRN: [id number] Team Conference Report to Patient/Family  Team Conference discussion was reviewed with the patient and caregiver, including goals, any changes in plan of care and target discharge date.  Patient and caregiver express understanding and are in agreement.  The patient has a target discharge date of 08/30/21.  Sw met with pt and called pt sister [name]) via telephone to provide conference updates. Family completed education today. Patient improving at sup/min A level, with goals of supervision. Pt has all DME  Dyanne Iha 08/24/2021, 1:23 PM

## 2021-08-24 NOTE — Patient Care Conference (Signed)
Inpatient RehabilitationTeam Conference and Plan of Care Update Date: 08/24/2021   Time: 11:30 AM    Patient Name: [name]      Medical Record Number: [id number]  Date of Birth: [birth year] Sex: Female         Room/Bed: 4W01C/4W01C-01 Payor Info: Payor: [employer] MEDICARE / Plan: [employer] MEDICARE CHOICE PPO / Product Type: *No Product type* /    Admit Date/Time:  08/10/2021  3:19 PM  Primary Diagnosis:  Left pontine cerebrovascular accident [facility])  Hospital Problems: Principal Problem:   Left pontine cerebrovascular accident [facility]) Active Problems:   Diabetic peripheral neuropathy [facility])   Essential hypertension    Expected Discharge Date: Expected Discharge Date: 08/30/21  Team Members Present: Physician leading conference: Dr. Delice Lesch Social Worker Present: Erlene Quan, BSW Nurse Present: Dorien Chihuahua, RN PT Present: Page Spiro, PT OT Present: Clyda Greener, OT SLP Present: Sherren Kerns, SLP PPS Coordinator present : Gunnar Fusi, SLP     Current Status/Progress Goal Weekly Team Focus  Bowel/Bladder             Swallow/Nutrition/ Hydration             ADL's   Supervision for UB bathing with supervision to min for LB bathing.  Min assist for LB bathing and dressing sit to stand as well as transfers  supervision overall  selfcare retrianing, transfer training, neuromuscular re-education, balance retrianing, therapeutic activite, pt/family education   Mobility   supervision supine<>sit using bedrail, CGA/min assist sit<>stands and stand pivot transfers using RW from EOB and pt's personal power wheelchair, CGA/min assist gait up to 17f using RW with pt demoing anterior lean and "toe walking" with significantly impaired balance, power wheelchair mobility >2074fwith supervision improved ability to navigate in small spaces or back up next to surfaces with improved response time  supervision overall with ambulation and mod-I at power w/c level  power  wheelchair mobility, transfer training, bed mobility training, dynamic gait training, dynamic standing balance, activity tolerance, midline orientation (to decrease anterior lean), and pt education   Communication             Safety/Cognition/ Behavioral Observations            Pain             Skin               Discharge Planning:  Home with sister and granddaughter/daughter-12 yo. Wil be alone during the day while sister works   Team Discussion: DM variable using insulin pump. Concerns about sanitizing BiPaP equipment; needs assistance to complete.  Patient on target to meet rehab goals: yes, currently needs supervision for upper body bathing and min assist for lower body care. Supervision for bed mobility and CGA for sit- stand. Needs min assist for stand pivot transfers. Recommend patient use power wheelchair at home when family not available and rails to navigate into the bathroom.  *See Care Plan and progress notes for long and short-term goals.   Revisions to Treatment Plan:  Ambulation trial; able to manage 5054with min assist due anterior lean. 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 as well as history of remote lacunar  infarcts  Barriers to Discharge: Medical stability;Weight;Decreased family/caregiver support   Possible Resolutions to [employer] Focus: Therapies, optimize DM meds, monitor weights   Continued Need for Acute Rehabilitation Level of Care: The patient requires daily medical management by a physician with specialized training in physical medicine and rehabilitation for the  following reasons: Direction of a multidisciplinary physical rehabilitation program to maximize functional independence : Yes Medical management of patient stability for increased activity during participation in an intensive rehabilitation regime.: Yes Analysis of laboratory values and/or radiology reports with any subsequent need for medication adjustment and/or medical intervention. : Yes   I attest that I was present, lead the team conference, and concur with the assessment and plan of the team.   Dorien Chihuahua B 08/24/2021, 1:54 PM

## 2021-08-25 ENCOUNTER — Ambulatory Visit: Payer: Medicare PPO | Admitting: Family Medicine

## 2021-08-25 LAB — GLUCOSE, CAPILLARY
Glucose-Capillary: 184 mg/dL — ABNORMAL HIGH (ref 70–99)
Glucose-Capillary: 163 mg/dL — ABNORMAL HIGH (ref 70–99)
Glucose-Capillary: 232 mg/dL — ABNORMAL HIGH (ref 70–99)
Glucose-Capillary: 176 mg/dL — ABNORMAL HIGH (ref 70–99)

## 2021-08-25 MED ORDER — INSULIN ASPART 100 UNIT/ML IJ SOLN
100.0000 [IU] | Freq: Once | INTRAMUSCULAR | Status: DC
  Filled 2021-08-25: qty 1

## 2021-08-25 MED FILL — Insulin Aspart Inj Soln 100 Unit/ML: INTRAMUSCULAR | Qty: 10 | Status: AC

## 2021-08-25 NOTE — Consult Note (Signed)
WOC Nurse Consult Note: Patient receiving care in [phone] Reason for Consult: Ulcer left heel Wound type: Stage 3 PI evolved from an unstageable that the eschar has come off. Previously assessed by K. Sanders  Measurement: 2 cm x 2 cm x 0.1 cm  Wound bed: pink/red with an island of skin between.  Drainage (amount, consistency, odor) None Periwound: intact Dressing procedure/placement/frequency: Apply a small piece of Xeroform gauze to the left heel and secure with a foam dressing.  Monitor the wound area(s) for worsening of condition such as: Signs/symptoms of infection, increase in size, development of or worsening of odor, development of pain, or increased pain at the affected locations.   Notify the medical team if any of these develop.  Thank you for the consult. Octa nurse will not follow at this time.   Please re-consult the [facility] team if needed.  Cathlean Marseilles Tamala Julian, MSN, RN, [place], Lysle Pearl, [facility] Wound Treatment Associate Pager [phone]

## 2021-08-25 NOTE — Progress Notes (Signed)
Physical Therapy Weekly Progress Note  Patient Details  Name: [name] MRN: [id number] Date of Birth: [birth year]  Beginning of progress report period: August 18, 2021 End of progress report period: August 25, 2021  Today's Date: 08/25/2021 PT Individual Time: 0938-1829 PT Individual Time Calculation (min): 69 min   Patient has met 2 of 5 short term goals. [name] is making slow, steady progress with therapy demonstrating increasing independence with functional mobility. She is performing supine<>sit with supervision using R bedrail, sit<>stands and stand pivot transfers using RW with min assist progressing towards CGA, and ambulating up to 28f with min assist progressing towards CGA. During gait pt continues to take slow, uncoordinated steps with wide BOS and pause in double limb support to regain balance. She is performing power wheelchair mobility in her personal chair >2037fwith supervision and demonstrates improved control of speed and improved navigation in smaller, room environments. She remains at a high fall risk and based on this it has been recommended to family for 24hr support; however, they are unable to provide this. Therefore, therapist has recommended that patient perform functional mobility at power wheelchair level when family not at home - pt/family in agreement. Pt will benefit from continued CIR level therapies to progress towards more consistent supervision/CGA level prior to D/C.   Patient continues to demonstrate the following deficits muscle weakness and muscle joint tightness, decreased cardiorespiratoy endurance, impaired timing and sequencing, abnormal tone, unbalanced muscle activation, decreased coordination, and decreased motor planning,  , and decreased standing balance, decreased postural control, and decreased balance strategies and therefore will continue to benefit from skilled PT intervention to increase functional independence with  mobility.  Patient not progressing toward long term goals.  See goal revision..  Continue plan of care.  PT Short Term Goals Week 2:  PT Short Term Goal 1 (Week 2): Pt will perform supine<>sit using bedrail with CGA PT Short Term Goal 1 - Progress (Week 2): Met PT Short Term Goal 2 (Week 2): Pt will perform bed<>chair transfers using LRAD with CGA PT Short Term Goal 2 - Progress (Week 2): Progressing toward goal PT Short Term Goal 3 (Week 2): Pt will perform bed<>chair transfers using LRAD with CGA PT Short Term Goal 3 - Progress (Week 2): Progressing toward goal PT Short Term Goal 4 (Week 2): Pt will ambulate at least 3071fsing LRAD with CGA PT Short Term Goal 4 - Progress (Week 2): Progressing toward goal PT Short Term Goal 5 (Week 2): Pt will demonstrate body awareness with wheelchair mobility, requiring verbal cuing <25% for safely navigating obstacles without making contact PT Short Term Goal 5 - Progress (Week 2): Met Week 3:  PT Short Term Goal 1 (Week 3): = to LTGs based on ELOS  Skilled Therapeutic Interventions/Progress Updates:  Ambulation/gait training;Community reintegration;DME/adaptive equipment instruction;Neuromuscular re-education;Psychosocial support;UE/LE Strength taining/ROM;Wheelchair propulsion/positioning;Balance/vestibular training;Discharge planning;Functional electrical stimulation;Pain management;Skin care/wound management;Therapeutic Activities;UE/LE Coordination activities;Cognitive remediation/compensation;Disease management/prevention;Functional mobility training;Patient/family education;Splinting/orthotics;Therapeutic Exercise;Visual/perceptual remediation/compensation   Pt received sitting in her personal power wheelchair and agreeable to therapy session. Power w/c mobility ~[id number] to/from main therapy gym with distant supervision and pt demonstrating improved control over the speed of wheelchair and improved control when navigating in smaller spaces. Focus of  therapy session of providing patient with HEPs - one that can be performed modified independent from power wheelchair level and another that is for patient to perform with family's assistance for balance and safety. Educated pt on how to safely set-up the exercises in  standing with family's assistance and wrote this information on HEP. Pt performed 1 set of each of the following exercises to ensure understanding and proper form. Pt with no questions. At end of session pt left seated in power w/c with seat belt on, needs in reach, and meal tray set-up.   To be done at wheelchair level mod-I: Access Code: [email or web address] URL: [email or web address] Date: 08/25/2021 Prepared by: Page Spiro  Exercises Seated Long Arc Quad - 1 x daily - 7 x weekly - 3 sets - 20 reps Seated Marching with Opposite Shoulder Flexion - 1 x daily - 7 x weekly - 3 sets - 20 reps Seated Ankle Pumps - 1 x daily - 7 x weekly - 3 sets - 20 reps Seated Hip Adduction Isometrics with Ball - 1 x daily - 7 x weekly - 3 sets - 20 reps - 5 seconds hold Seated Scapular Retraction with Resistance - 1 x daily - 7 x weekly - 3 sets - 20 reps Seated Bicep Curl with Anchored Resistance - 1 x daily - 7 x weekly - 3 sets - 20 reps   To be done with family: Access Code: [email or web address] URL: [email or web address] Date: 08/25/2021 Prepared by: Page Spiro  Exercises Sit to Stand with Counter Support - 1 x daily - 7 x weekly - 2 sets - 10 reps Alternating Step Taps with Counter Support - 1 x daily - 7 x weekly - 2 sets - 10 reps Standing Knee Flexion with Counter Support - 1 x daily - 7 x weekly - 2 sets - 10 reps Walking - 1 x daily - 7 x weekly - 3 sets - 50 feet hold    Therapy Documentation Precautions:  Precautions Precautions: Fall, Other (comment) Precaution Comments: watch gait belt placement - insulin pump Rt abdomen Restrictions Weight Bearing Restrictions: No  Pain:  No reports of pain throughout  session, but just some muscle soreness/activation during exercises.    Therapy/Group: Individual Therapy  Tawana Scale , PT, DPT, NCS, CSRS 08/25/2021, 3:36 PM

## 2021-08-25 NOTE — Progress Notes (Signed)
Occupational Therapy Session Note  Patient Details  Name: [name] MRN: [id number] Date of Birth: 08-Jul-1951  Today's Date: 08/25/2021 OT Individual Time: [id number] OT Individual Time Calculation (min): 56 min    Short Term Goals: Week 3:  OT Short Term Goal 1 (Week 3): Continue working on established LTGs set at supervision level overall.  Skilled Therapeutic Interventions/Progress Updates:     Session 1: [phone]) Pt in bed to start session, agreeable to completion of selfcare tasks sit to stand EOB.  Supervision for transfer from supine with HOB elevated 30 degrees and use of the bed rails.  She was able to complete UB bathing and dressing with supervision and then utilized the reacher with supervision for removal of gripper socks.  She donned her shoes for standing when removing her pants and underpants as well as when washing her front and back peri area and donning new clothing.  She was able to complete sit to stand with min assist from the elevated EOB.  Assist was needed for washing her buttocks as she was not able to thoroughly reach the area, and she uses a toilet aide to assist with this at home.  She was able to use the sockaide with mod assist for setup to donn her own compression socks from home with supervision for donning her shoes using the reacher with the laces already tied.  Transfer to the power wheelchair at min guard assist with completion of oral hygiene at the sink with setup.  Finished session sitting in power wheelchair with the call button and phone in reach and safety belt in place.    Session 2: [id number])  Pt in power wheelchair to start.  Worked on simulated bathroom setup at home for completion of toilet transfers during the day when her family is at work.  She was able to complete simulated transfer with use of the RW and min assist. She reports feeling like there will probably not be enough room in the bathroom to turn around with the walker, so  it is possibly not an option.  With attempt to complete with setup of grab bars at home and no support, she demonstrated LOB to the left once getting into the bathroom, requiring min assist to correct.  Feel currently that it will not be safe for her to perform toilet transfers at home alone, attempting to get into her bathroom with or without use of the RW.  Discussed possible options of having a bedside toilet and then wearing dresses so that she can complete squat pivot transfer over to the toilet and then lean laterally side to side to help get it up and down vs standing.  Will continue to try and problem solve and come up with safe solutions.  She was left sitting up in the power wheelchair with call button and phone in reach and safety belt in place.     Therapy Documentation Precautions:  Precautions Precautions: Fall, Other (comment) Precaution Comments: watch gait belt placement - insulin pump Rt abdomen Restrictions Weight Bearing Restrictions: No  Pain: Pain Assessment Pain Scale: Faces Pain Score: 0-No pain Faces Pain Scale: No hurt ADL: See Care Tool Section for some details of mobility and selfcare   Therapy/Group: Individual Therapy  Aniceto Kyser OTR/L 08/25/2021, 4:49 PM

## 2021-08-25 NOTE — Plan of Care (Signed)
  Problem: RH Balance Goal: LTG Patient will maintain dynamic sitting balance (PT) Description: LTG:  Patient will maintain dynamic sitting balance with assistance during mobility activities (PT) Flowsheets (Taken 08/25/2021 1832) LTG: Pt will maintain dynamic sitting balance during mobility activities with:: (downgraded based on pt progress) Supervision/Verbal cueing Note: downgraded based on pt progress Goal: LTG Patient will maintain dynamic standing balance (PT) Description: LTG:  Patient will maintain dynamic standing balance with assistance during mobility activities (PT) Flowsheets (Taken 08/25/2021 1832) LTG: Pt will maintain dynamic standing balance during mobility activities with:: (downgraded based on pt progress) Minimal Assistance - Patient > 75% Note: downgraded based on pt progress   Problem: RH Bed to Chair Transfers Goal: LTG Patient will perform bed/chair transfers w/assist (PT) Description: LTG: Patient will perform bed to chair transfers with assistance (PT). Flowsheets (Taken 08/25/2021 1832) LTG: Pt will perform Bed to Chair Transfers with assistance level: (downgraded based on pt progress) Contact Guard/Touching assist Note: downgraded based on pt progress   Problem: RH Ambulation Goal: LTG Patient will ambulate in controlled environment (PT) Description: LTG: Patient will ambulate in a controlled environment, # of feet with assistance (PT). Flowsheets (Taken 08/25/2021 1832) LTG: Pt will ambulate in controlled environ  assist needed:: (downgraded based on pt progress) Contact Guard/Touching assist LTG: Ambulation distance in controlled environment: 32f using LRAD Note: downgraded based on pt progress Goal: LTG Patient will ambulate in home environment (PT) Description: LTG: Patient will ambulate in home environment, # of feet with assistance (PT). Flowsheets (Taken 08/25/2021 1832) LTG: Pt will ambulate in home environ  assist needed:: (downgraded based on pt progress)  Contact Guard/Touching assist LTG: Ambulation distance in home environment: 227fusing LRAD Note: downgraded based on pt progress

## 2021-08-25 NOTE — Progress Notes (Signed)
Occupational Therapy Session Note  Patient Details  Name: [name] MRN: [id number] Date of Birth: [birth year]  Today's Date: 08/25/2021 OT Group Time: 1330-1430 OT Group Time Calculation (min): 60 min   Short Term Goals: Week 3:  OT Short Term Goal 1 (Week 3): Continue working on established LTGs set at supervision level overall.  Skilled Therapeutic Interventions/Progress Updates:  Pt participated in group session with a focus on stress mgmt, education on healthy coping strategies, and social interaction.  Session focus on breaking down stressors into "daily hassles," "major life stressors" and "life circumstances" in an effort to allow pts to chunk their stressors into groups and rank them on a scale of 1-10. Pt actively sharing stressors and contributing to group conversation. Offered education on factors that protect [place] against stress such as "daily uplifts," "healthy coping strategies" and "protective factors." Encouraged all group members to make an effort to actively recall one event from their day that was a daily uplift in an effort to protect their mindset from stressors. Issued pt handouts on healthy coping strategies to implement into routine. Pt actively using LUE to hold clipboard and write down noted during session. Pt transported back to room by this OTA where pt left seated in PWC with safety belt activated.   Therapy Documentation Precautions:  Precautions Precautions: Fall, Other (comment) Precaution Comments: watch gait belt placement - insulin pump Rt abdomen Restrictions Weight Bearing Restrictions: No  Pain: Pt reports no pain during group session.    Therapy/Group: Group Therapy  [name] 08/25/2021, 2:51 PM

## 2021-08-25 NOTE — Progress Notes (Signed)
Inpatient Rehabilitation Care Coordinator Discharge Note   Patient Details  Name: [name] MRN: [id number] Date of Birth: 11/29/1951   Discharge location: Home  Length of Stay: 20 Days  Discharge activity level: Supervision  Home/community participation: Family able to provide interminttent A  Patient response [id number] Literacy - How often do you need to have someone help you when you read instructions, pamphlets, or other written material from your doctor or pharmacy?: Sometimes  Patient response [id number] Isolation - How often do you feel lonely or isolated from those around you?: Rarely  Services provided included: MD, RD, PT, OT, SLP, RN, CM, Pharmacy, TR, Neuropsych, SW  Financial Services:  Charity fundraiser Utilized: Other (Comment) ([employer])    Choices offered to/list presented to: Patient  Follow-up services arranged:  [place]: [facility]         Patient response to transportation need: Is the patient able to respond to transportation needs?: Yes In the past 12 months, has lack of transportation kept you from medical appointments or from getting medications?: No In the past 12 months, has lack of transportation kept you from meetings, work, or from getting things needed for daily living?: No    Comments (or additional information):  Patient/Family verbalized understanding of follow-up arrangements:  Yes  Individual responsible for coordination of the follow-up plan: patient or [name] [phone]  Confirmed correct DME delivered: Dyanne Iha 08/25/2021    Dyanne Iha

## 2021-08-25 NOTE — Progress Notes (Signed)
PROGRESS NOTE   Subjective/Complaints:  Pt reports tylenol not real helpful- tramadol was immensely better for leg pain- wondering if can take after d/c- will send on 7 days and see if PCP can write for it.   ROS:  Pt denies SOB, abd pain, CP, N/V/C/D, and vision changes   Objective:   No results found. No results for input(s): WBC, HGB, HCT, PLT in the last 72 hours.  No results for input(s): NA, K, CL, CO2, GLUCOSE, BUN, CREATININE, CALCIUM in the last 72 hours.   Intake/Output Summary (Last 24 hours) at 08/25/2021 0956 Last data filed at 08/25/2021 0800 Gross per 24 hour  Intake 360 ml  Output --  Net 360 ml     Pressure Injury 08/10/21 Heel Left;Lateral Unstageable - Full thickness tissue loss in which the base of the injury is covered by slough (yellow, tan, gray, green or brown) and/or eschar (tan, brown or black) in the wound bed. (Active)  08/10/21   Location: Heel  Location Orientation: Left;Lateral  Staging: Unstageable - Full thickness tissue loss in which the base of the injury is covered by slough (yellow, tan, gray, green or brown) and/or eschar (tan, brown or black) in the wound bed.  Wound Description (Comments):   Present on Admission: Yes    Physical Exam: Vital Signs Blood pressure (!) 122/47, pulse 63, temperature 98.5 F (36.9 C), temperature source Oral, resp. rate 14, height '5\' 7"'$  (1.702 m), weight 117.4 kg, SpO2 94 %.   General: awake, alert, appropriate, sitting up in bed; reading; NAD HENT: conjugate gaze; oropharynx moist CV: regular rate; no JVD Pulmonary: CTA B/L; no W/R/R- good air movement GI: soft, NT, ND, (+)BS Psychiatric: appropriate Neurological: Ox3 Skin: Warm and dry.  Intact.. Musc: No edema in extremities.  No tenderness in extremities. Neuro: Alert and oriented Motor: Grossly 4/5 throughout Left upper extremity mild ataxia  Assessment/Plan: 1. Functional deficits which  require 3+ hours per day of interdisciplinary therapy in a comprehensive inpatient rehab setting. Physiatrist is providing close team supervision and 24 hour management of active medical problems listed below. Physiatrist and rehab team continue to assess barriers to discharge/monitor patient progress toward functional and medical goals  Care Tool:  Bathing    Body parts bathed by patient: Right arm, Left arm, Chest, Abdomen, Right upper leg, Left upper leg, Face, Front perineal area   Body parts bathed by helper: Buttocks Body parts n/a: Right lower leg, Left lower leg   Bathing assist Assist Level: Minimal Assistance - Patient > 75%     Upper Body Dressing/Undressing Upper body dressing   What is the patient wearing?: Pull over shirt, Bra    Upper body assist Assist Level: Supervision/Verbal cueing    Lower Body Dressing/Undressing Lower body dressing      What is the patient wearing?: Pants, Underwear/pull up     Lower body assist Assist for lower body dressing: Maximal Assistance - Patient 25 - 49%     Toileting Toileting    Toileting assist Assist for toileting: Minimal Assistance - Patient > 75%     Transfers Chair/bed transfer  Transfers assist     Chair/bed transfer assist level:  Contact Guard/Touching assist Chair/bed transfer assistive device: Walker, Armrests, Bedrails   Locomotion Ambulation   Ambulation assist      Assist level: Minimal Assistance - Patient > 75% Assistive device: Walker-rolling Max distance: 10f   Walk 10 feet activity   Assist     Assist level: Contact Guard/Touching assist Assistive device: Walker-rolling   Walk 50 feet activity   Assist Walk 50 feet with 2 turns activity did not occur: Safety/medical concerns  Assist level: Minimal Assistance - Patient > 75% Assistive device: Walker-rolling    Walk 150 feet activity   Assist Walk 150 feet activity did not occur: Safety/medical concerns          Walk 10 feet on uneven surface  activity   Assist Walk 10 feet on uneven surfaces activity did not occur: Safety/medical concerns         Wheelchair     Assist Is the patient using a wheelchair?: Yes Type of Wheelchair: Power    Wheelchair assist level: Supervision/Verbal cueing, Set up assist Max wheelchair distance: 2030f   Wheelchair 50 feet with 2 turns activity    Assist        Assist Level: Supervision/Verbal cueing   Wheelchair 150 feet activity     Assist      Assist Level: Supervision/Verbal cueing   Blood pressure (!) 122/47, pulse 63, temperature 98.5 F (36.9 C), temperature source Oral, resp. rate 14, height '5\' 7"'$  (1.702 m), weight 117.4 kg, SpO2 94 %.  Medical Problem List and Plan: 1.  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  Antithrombotics: -DVT/anticoagulation: Eliquis Mechanical: Sequential compression devices, below knee Bilateral lower extremities Pharmaceutical: Lovenox             -antiplatelet therapy: N/A 3. Pain Management: Neurontin 100 mg 3 times daily  8/30- will try tramadol 50 mg BID prn for severe pain- explained won't help stiffness, but can help pain.   9/1- worked "immensely better" than Tylenol- wondering if can go home on 1-2x/day low dose/             Monitor with increased exertion 4. Mood: Prozac 30 mg daily             -antipsychotic agents: N/A 5. Neuropsych: This patient is capable of making decisions on her own behalf. 6. Skin/Wound Care/W OC follow-up: Routine skin checks.  Left heel and right shin wounds 7. Fluids/Electrolytes/Nutrition: Routine in and outs   -Cr stable to improved. Probably baseline insufficiency   Creatinine 1.19 on 8/29      -pt is on equivalent of ocuvite (prosight)  8.   Hypertension.  Norvasc 10 mg daily.         Vitals:   08/24/21 2325 08/25/21 0335  BP:  (!) 122/47  Pulse: 64 63  Resp: 16 14  Temp:  98.5 F (36.9 C)  SpO2: 96% 94%   9/1- BP controlled- con't regimen 9.  History of atrial fibrillation status post pacemaker.  Cardiac rate controlled.  Continue Eliquis  8/30- cannot use NSADIS due to Eliquis- FYI- told pt.  10.  Diabetes mellitus with peripheral neuropathy.  Patient does have an insulin pump that is being utilized          CBG (last 3)  Recent Labs  08/24/21 1644 08/24/21 2122 08/25/21 0538  GLUCAP 196* 235* 184*  Elevated diabetes coordinator to assist with pump settings 9/1- asked pt to call Endo- waiting to hear back.  11.  Diastolic congestive heart failure.  Demadex 10 mg daily.  Monitor for any signs of fluid overload   Filed Weights   08/21/21 0500 08/22/21 0500 08/25/21 0500  Weight: 119 kg 119.5 kg 117.4 kg    Stable on 8/29  12.  Hyperlipidemia.  Lovaza/Lipitor 13.  Hypothyroidism.  Synthroid 14.  GERD.  Protonix 15.  Morbid obesity.  BMI 42.02.  Dietary follow-up.  Encourage weight loss 17.  OSA.  BiPAP  Discussed with nursing cleaning of BiPAP   LOS: 15 days A FACE TO FACE EVALUATION WAS PERFORMED  Denard Tuminello 08/25/2021, 9:56 AM

## 2021-08-25 NOTE — Evaluation (Signed)
Recreational Therapy Assessment and Plan  Patient Details  Name: [name] MRN: [id number] Date of Birth: [birth year] Today's Date: 08/25/2021  Rehab Potential:  Good ELOS:   d/c 9/6  Assessment  Hospital Problem: Principal Problem:   Left pontine cerebrovascular accident [facility])     Past Medical History:      Past Medical History:  Diagnosis Date   Abnormal echocardiogram     Anemia     Arthritis     BPPV (benign paroxysmal positional vertigo), right 06/13/2017   Bradycardia 03/07/2020   Cerebrovascular accident (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 ([place]) 10/29/2015   CHB (complete heart block) ([place]) 03/08/2020   CHF (congestive heart failure) ([facility])     Closed fracture of left proximal humerus 07/08/2019   Decreased range of motion of left shoulder 09/03/2020   Depression     Dizzy 03/07/2020    bradycardia   Edentulous 09/03/2020   GERD (gastroesophageal reflux disease)     Hemiparesis affecting left side as late effect of cerebrovascular accident (CVA) ([place]) 03/08/2020   Hemorrhoids     History of Falls with injury 03/08/2020    Fall resulting in left humeral fracture   Hyperlipidemia     Hypertension     Hypothyroidism     Intracranial vascular stenosis     Lacunar infarction ([place])      Brain MRI 07/2021 multiple small remote pontine lacunar infarcts   Near syncope 03/08/2020   Paroxysmal atrial fibrillation (HCC)     Presence of permanent cardiac pacemaker     Proteinuria 03/08/2020   Seasonal allergies     Stroke ([place])     Thyroid cancer ([place])      per pt S/p Total Thyroidectomy with Radioactive Iodine Therapy   Type 1 diabetes mellitus ([place])      Past Surgical History:       Past Surgical History:  Procedure Laterality Date   ABDOMINAL ANGIOGRAM N/A 05/14/2012    Procedure: ABDOMINAL ANGIOGRAM;  Surgeon: Laverda Page, MD;  Location: [facility] C[facility] LAB;  Service: Cardiovascular;  Laterality: N/A;   ABDOMINAL HYSTERECTOMY   1995    partial    ANKLE FRACTURE SURGERY Right     CARDIAC CATHETERIZATION        with coronary angiogram   cataract  Bilateral 2018   COLONOSCOPY N/A 09/09/2014    Procedure: COLONOSCOPY;  Surgeon: Gatha Mayer, MD;  Location: [place];  Service: Endoscopy;  Laterality: N/A;   ESOPHAGOGASTRODUODENOSCOPY (EGD) WITH PROPOFOL N/A 12/03/2018    Procedure: ESOPHAGOGASTRODUODENOSCOPY (EGD) WITH PROPOFOL;  Surgeon: Doran Stabler, MD;  Location: [place];  Service: Gastroenterology;  Laterality: N/A;   LEFT AND RIGHT HEART CATHETERIZATION WITH CORONARY ANGIOGRAM N/A 05/14/2012    Procedure: LEFT AND RIGHT HEART CATHETERIZATION WITH CORONARY ANGIOGRAM;  Surgeon: Laverda Page, MD;  Location: [facility] CATH LAB;  Service: Cardiovascular;  Laterality: N/A;   LOOP RECORDER INSERTION N/A 12/23/2018    Procedure: LOOP RECORDER INSERTION;  Surgeon: Constance Haw, MD;  Location: [place] CV LAB;  Service: Cardiovascular;  Laterality: N/A;   LOOP RECORDER REMOVAL N/A 06/25/2020    Procedure: LOOP RECORDER REMOVAL;  Surgeon: Constance Haw, MD;  Location: [place] CV LAB;  Service: Cardiovascular;  Laterality: N/A;   MINI METER    09/08/2014    ELECTRONIC INSULIN PUMP   PACEMAKER IMPLANT N/A 06/25/2020    Procedure: PACEMAKER IMPLANT;  Surgeon: Constance Haw, MD;  Location: [place]  CV LAB;  Service: Cardiovascular;  Laterality: N/A;   TEE WITHOUT CARDIOVERSION N/A 10/16/2018    Procedure: TRANSESOPHAGEAL ECHOCARDIOGRAM (TEE);  Surgeon: Buford Dresser, MD;  Location: [facility] ENDOSCOPY;  Service: Cardiovascular;  Laterality: N/A;   THYROIDECTOMY   2006      Assessment & Plan Clinical Impression: Patient is a 70 y.o. year old female with recent admission to the hospital on 08/04/2021 with increasing left hemiparesis and worsening dysarthria.  CT/MRI showed small acute infarct in the left pons.  No significant edema or mass-effect.  Severe chronic microvascular ischemic disease involving the  pons and left middle cerebellar peduncle with many remote lacunar infarcts progressed from 2019.  Remote pontine microhemorrhage.  Patient transferred to CIR on 08/10/2021 .     Pt presents with decreased activity tolerance, decreased functional mobility, decreased balance, decreased coordination, decreased awareness Limiting pt's independence with leisure/community pursuits.  Met with pt today to discuss TR services, leisure interests, activity analysis/modifications and coping strategies.  Pt referred by team for participation in stress management group.   Pt participated in group education/discussion identifying factors that contribute to stress, factors that protect against stress & coping strategies (deep breathing, imagery, progressive muscle relaxation & challenging irrational thoughts)  Pt provided with handouts on the above and appreciative of this group and education received.  No further TR as pt is expected to discharge home with family 9/6.  Recommendations for other services: None   Discharge Criteria: Patient will be discharged from TR if patient refuses treatment 3 consecutive times without medical reason.  If treatment goals not met, if there is a change in medical status, if patient makes no progress towards goals or if patient is discharged from hospital.  The above assessment, treatment plan, treatment alternatives and goals were discussed and mutually agreed upon: by patient  [place] 08/25/2021, 4:20 PM

## 2021-08-26 DIAGNOSIS — D72819 Decreased white blood cell count, unspecified: Secondary | ICD-10-CM

## 2021-08-26 LAB — GLUCOSE, CAPILLARY
Glucose-Capillary: 248 mg/dL — ABNORMAL HIGH (ref 70–99)
Glucose-Capillary: 148 mg/dL — ABNORMAL HIGH (ref 70–99)
Glucose-Capillary: 145 mg/dL — ABNORMAL HIGH (ref 70–99)
Glucose-Capillary: 168 mg/dL — ABNORMAL HIGH (ref 70–99)

## 2021-08-26 NOTE — Progress Notes (Signed)
Physical Therapy Session Note  Patient Details  Name: [name] MRN: [id number] Date of Birth: Apr 30, 1951  Today's Date: 08/26/2021 PT Individual Time: 0211-1552 PT Individual Time Calculation (min): 60 min   Short Term Goals: Week 1:  PT Short Term Goal 1 (Week 1): Pt will perform supine<>sit using bedrail with min assist PT Short Term Goal 1 - Progress (Week 1): Met PT Short Term Goal 2 (Week 1): Pt will perform sit<>stands using LRAD with CGA PT Short Term Goal 2 - Progress (Week 1): Met PT Short Term Goal 3 (Week 1): Pt will perform bed<>chair transfers using LRAD with CGA PT Short Term Goal 3 - Progress (Week 1): Progressing toward goal PT Short Term Goal 4 (Week 1): Pt will ambulate at least 17f using LRAD with CGA PT Short Term Goal 4 - Progress (Week 1): Progressing toward goal Week 2:  PT Short Term Goal 1 (Week 2): Pt will perform supine<>sit using bedrail with CGA PT Short Term Goal 1 - Progress (Week 2): Met PT Short Term Goal 2 (Week 2): Pt will perform bed<>chair transfers using LRAD with CGA PT Short Term Goal 2 - Progress (Week 2): Progressing toward goal PT Short Term Goal 3 (Week 2): Pt will perform bed<>chair transfers using LRAD with CGA PT Short Term Goal 3 - Progress (Week 2): Progressing toward goal PT Short Term Goal 4 (Week 2): Pt will ambulate at least 324fusing LRAD with CGA PT Short Term Goal 4 - Progress (Week 2): Progressing toward goal PT Short Term Goal 5 (Week 2): Pt will demonstrate body awareness with wheelchair mobility, requiring verbal cuing <25% for safely navigating obstacles without making contact PT Short Term Goal 5 - Progress (Week 2): Met Week 3:  PT Short Term Goal 1 (Week 3): = to LTGs based on ELOS  Skilled Therapeutic Interventions/Progress Updates:   PAIN denies pain Pt initially supine and requesting assist w/bathing and dressing.  Supine to sit w/min assist.  Maintains static and dynamic sitting throughout  w/supervision.  Pt able to perform upper body dressing w/set up and min assist overall  Supervision w/upperbody and lower body dressing in sitting, mod assist in standing due to A/P instability without UE support on walker total assist for back and bottom bathing.  Lower body dressing w/set up, use of reacher, min assist overall in sitting, additional time.   Repeated Sit to stand mult times during activity w/min assist for steadying, overall min assist for balance w/bathing and dressing. stand pivot transfer w/RW w/cga [facility] propels to sink for oral care, performs w/set up from wc level. Pt able to back/park wc adjacent to bed w/supervision.  Pt left oob in wc w/alarm belt set and needs in reach . Therapy Documentation Precautions:  Precautions Precautions: Fall, Other (comment) Precaution Comments: watch gait belt placement - insulin pump Rt abdomen Restrictions Weight Bearing Restrictions: No    Therapy/Group: Individual Therapy BaCallie FieldingPTScarbro/01/2021, 12:30 PM

## 2021-08-26 NOTE — Progress Notes (Signed)
Occupational Therapy Session Note  Patient Details  Name: [name] MRN: [id number] Date of Birth: 1951/06/30  Today's Date: 08/26/2021 OT Individual Time: 7035-0093 OT Individual Time Calculation (min): 39 min    Short Term Goals: Week 3:  OT Short Term Goal 1 (Week 3): Continue working on established LTGs set at supervision level overall.  Skilled Therapeutic Interventions/Progress Updates:    Pt received seated in power w/c with administrative staff present, agreeable to therapy. Session focus on activity tolerance, LUE NMR, static standing balance, func transfers in prep for improved ADL/IADL/func mobility performance + decreased caregiver burden. Self propelled power w/c to and from gym with mod I. Seated at BITS with LUE, pt completed the following games :  Single Target:  83.16% accuracy, 1.87 reaction time; pt req assist to hit targets on R side of screen 2/2 limited LUE ROM   Bell Cancellation: 2 min 12 secs, 4 misses  Standing, pt completed the following with RUE, req overall min A to stabilize RW as pt tends to push it in front her with poor awareness.  Trail Making Part A : 1 min 40 secs, 7 errors  Rotating Number Sequence: 89.29% accuracy, 1 min 26 secs  Pt req overall min A to power up from power w/c and cues for B hand placement.  Pt left seated in power w/c with safety belt alarm engaged, call bell in reach, and all immediate needs met.    Therapy Documentation Precautions:  Precautions Precautions: Fall, Other (comment) Precaution Comments: watch gait belt placement - insulin pump Rt abdomen Restrictions Weight Bearing Restrictions: No  Pain: Pain Assessment Pain Score: 0-No pain ADL: See Care Tool for more details.   Therapy/Group: Individual Therapy  Volanda Napoleon MS, OTR/L  08/26/2021, 6:48 AM

## 2021-08-26 NOTE — Progress Notes (Signed)
PROGRESS NOTE   Subjective/Complaints: Pt seen sitting up in bed this AM, working with therapies.  She states she slept well overnight.  She states her BiPAP mask was changed but not the hose.  Otherwise she states she is doing well and appreciative of her care.  She notes improvement in left upper extremity function.  ROS: Denies CP, SOB, N/V/D  Objective:   No results found. No results for input(s): WBC, HGB, HCT, PLT in the last 72 hours.  No results for input(s): NA, K, CL, CO2, GLUCOSE, BUN, CREATININE, CALCIUM in the last 72 hours.   Intake/Output Summary (Last 24 hours) at 08/26/2021 1037 Last data filed at 08/26/2021 [id number] Gross per 24 hour  Intake 350 ml  Output --  Net 350 ml      Pressure Injury 08/10/21 Heel Left;Lateral Unstageable - Full thickness tissue loss in which the base of the injury is covered by slough (yellow, tan, gray, green or brown) and/or eschar (tan, brown or black) in the wound bed. (Active)  08/10/21   Location: Heel  Location Orientation: Left;Lateral  Staging: Unstageable - Full thickness tissue loss in which the base of the injury is covered by slough (yellow, tan, gray, green or brown) and/or eschar (tan, brown or black) in the wound bed.  Wound Description (Comments):   Present on Admission: Yes    Physical Exam: Vital Signs Blood pressure (!) 128/57, pulse (!) 57, temperature 98.1 F (36.7 C), resp. rate 17, height '5\' 7"'$  (1.702 m), weight 117.3 kg, SpO2 96 %. Constitutional: No distress . Vital signs reviewed. HENT: Normocephalic.  Atraumatic. Eyes: EOMI. No discharge. Cardiovascular: No JVD.  RRR. Respiratory: Normal effort.  No stridor.  Bilateral clear to auscultation. GI: Non-distended.  BS +. Skin: Warm and dry.  Intact. Psych: Normal mood.  Normal behavior. Musc: No edema in extremities.  No tenderness in extremities. Neuro: Alert and oriented Motor: Grossly 4/5 throughout,  improving Left upper extremity mild ataxia, improving  Assessment/Plan: 1. Functional deficits which require 3+ hours per day of interdisciplinary therapy in a comprehensive inpatient rehab setting. Physiatrist is providing close team supervision and 24 hour management of active medical problems listed below. Physiatrist and rehab team continue to assess barriers to discharge/monitor patient progress toward functional and medical goals  Care Tool:  Bathing    Body parts bathed by patient: Right arm, Left arm, Chest, Abdomen, Right upper leg, Left upper leg, Face, Front perineal area   Body parts bathed by helper: Buttocks Body parts n/a: Right lower leg, Left lower leg   Bathing assist Assist Level: Minimal Assistance - Patient > 75%     Upper Body Dressing/Undressing Upper body dressing   What is the patient wearing?: Pull over shirt, Bra    Upper body assist Assist Level: Supervision/Verbal cueing    Lower Body Dressing/Undressing Lower body dressing      What is the patient wearing?: Pants, Underwear/pull up     Lower body assist Assist for lower body dressing: Maximal Assistance - Patient 25 - 49%     Toileting Toileting    Toileting assist Assist for toileting: Minimal Assistance - Patient > 75%  Transfers Chair/bed transfer  Transfers assist     Chair/bed transfer assist level: Contact Guard/Touching assist Chair/bed transfer assistive device: Walker, Armrests, Bedrails   Locomotion Ambulation   Ambulation assist      Assist level: Minimal Assistance - Patient > 75% Assistive device: Walker-rolling Max distance: 25f   Walk 10 feet activity   Assist     Assist level: Contact Guard/Touching assist Assistive device: Walker-rolling   Walk 50 feet activity   Assist Walk 50 feet with 2 turns activity did not occur: Safety/medical concerns  Assist level: Minimal Assistance - Patient > 75% Assistive device: Walker-rolling    Walk 150  feet activity   Assist Walk 150 feet activity did not occur: Safety/medical concerns         Walk 10 feet on uneven surface  activity   Assist Walk 10 feet on uneven surfaces activity did not occur: Safety/medical concerns         Wheelchair     Assist Is the patient using a wheelchair?: Yes Type of Wheelchair: Power    Wheelchair assist level: Supervision/Verbal cueing, Set up assist Max wheelchair distance: 2044f   Wheelchair 50 feet with 2 turns activity    Assist        Assist Level: Supervision/Verbal cueing   Wheelchair 150 feet activity     Assist      Assist Level: Supervision/Verbal cueing   Blood pressure (!) 128/57, pulse (!) 57, temperature 98.1 F (36.7 C), resp. rate 17, height '5\' 7"'$  (1.702 m), weight 117.3 kg, SpO2 96 %.  Medical Problem List and Plan: 1.  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  Antithrombotics: -DVT/anticoagulation: Eliquis Mechanical: Sequential compression devices, below knee Bilateral lower extremities Pharmaceutical: Lovenox             -antiplatelet therapy: N/A 3. Pain Management: Neurontin 100 mg 3 times daily  8/30- will try tramadol 50 mg BID prn for severe pain- explained won't help stiffness, but can help pain.   Controlled on 9/2 Monitor with increased exertion 4. Mood: Prozac 30 mg daily             -antipsychotic agents: N/A 5. Neuropsych: This patient is capable of making decisions on her own behalf. 6. Skin/Wound Care/W OC follow-up: Routine skin checks.  Left heel and right shin wounds 7. Fluids/Electrolytes/Nutrition: Routine in and outs   -Cr stable to improved. Probably baseline insufficiency   Creatinine 1.19 on 8/29, labs ordered for Monday      -pt is on equivalent of ocuvite (prosight)  8.   Hypertension.  Norvasc 10 mg daily.         Vitals:   08/25/21 1922 08/26/21 0337  BP: (!) 128/52 (!) 128/57  Pulse: (!) 59 (!) 57  Resp: 18 17  Temp: 97.7 F (36.5 C) 98.1 F (36.7 C)  SpO2: 99% 96%    Controlled on 1/2 9.  History of atrial fibrillation status post pacemaker.  Cardiac rate controlled.  Continue Eliquis  8/30- cannot use NSADIS due to Eliquis- FYI- told pt.  10.  Diabetes mellitus with peripheral neuropathy.  Patient does have an insulin pump that is being utilized          CBG (last 3)  Recent Labs    08/25/21 1643 08/25/21 2112  08/26/21 0622  GLUCAP 232* 176* 248*   Elevated diabetes coordinator to assist with pump settings 9/1- asked pt to call Endo-awaiting recs Remains elevated on 9/2 11.  Diastolic congestive heart failure.  Demadex 10 mg daily.  Monitor for any signs of fluid overload   Filed Weights   08/22/21 0500 08/25/21 0500 08/26/21 0337  Weight: 119.5 kg 117.4 kg 117.3 kg     Stable on 9/2 12.  Hyperlipidemia.  Lovaza/Lipitor 13.  Hypothyroidism.  Synthroid 14.  GERD.  Protonix 15.  Morbid obesity.  BMI 42.02.  Dietary follow-up.  Encourage weight loss 17.  OSA.  BiPAP  Discussed with nursing cleaning of BiPAP  18.  Leukopenia  WBC 3.6 on 8/29  Labs ordered for Monday  LOS: 16 days A FACE TO FACE EVALUATION WAS PERFORMED  Jersee Winiarski Lorie Phenix 08/26/2021, 10:37 AM

## 2021-08-26 NOTE — Progress Notes (Signed)
Physical Therapy Session Note  Patient Details  Name: Preslie Depasquale MRN: [id number] Date of Birth: 1951/02/01  Today's Date: 08/26/2021 PT Individual Time: 4373-5789 PT Individual Time Calculation (min): 26 min   Short Term Goals: Week 1:  PT Short Term Goal 1 (Week 1): Pt will perform supine<>sit using bedrail with min assist PT Short Term Goal 1 - Progress (Week 1): Met PT Short Term Goal 2 (Week 1): Pt will perform sit<>stands using LRAD with CGA PT Short Term Goal 2 - Progress (Week 1): Met PT Short Term Goal 3 (Week 1): Pt will perform bed<>chair transfers using LRAD with CGA PT Short Term Goal 3 - Progress (Week 1): Progressing toward goal PT Short Term Goal 4 (Week 1): Pt will ambulate at least 28f using LRAD with CGA PT Short Term Goal 4 - Progress (Week 1): Progressing toward goal  Skilled Therapeutic Interventions/Progress Updates:  Pt received sitting in WC in room, denied pain and was agreeable to PT. Emphasis of session on reviewing HEP given by primary therapist day prior to assess recollection and ensure understanding prior to DC home. Pt able to teach therapist each exercise and demonstrated 10 reps of each, correctional cues provided as needed. Pt was left sitting in WC in room w/all needs in reach.   Therapy Documentation Precautions:  Precautions Precautions: Fall, Other (comment) Precaution Comments: watch gait belt placement - insulin pump Rt abdomen Restrictions Weight Bearing Restrictions: No   Therapy/Group: Individual Therapy JCruzita LedererPlaster, PT, DPT  08/26/2021, 7:50 AM

## 2021-08-26 NOTE — Progress Notes (Signed)
Occupational Therapy Session Note  Patient Details  Name: [name] MRN: [id number] Date of Birth: [birth year]  Today's Date: 08/26/2021 OT Individual Time: [id number] OT Individual Time Calculation (min): 57 min    Short Term Goals: Week 3:  OT Short Term Goal 1 (Week 3): Continue working on established LTGs set at supervision level overall.  Skilled Therapeutic Interventions/Progress Updates:    Pt in power wheelchair to start session.  She was able to drive herself down to the ortho gym and completed stand pivot transfer to the therapy mat with min assist using the RW for support.  Therapist had her start in sidelying in order to complete scapular mobilizations on the LUE.  She then transitioned to supine for glenohumeral mobilizations, cross arm stretch, and stretching of internal and external rotators secondary to limitations in both.  She was able to complete bilateral shoulder flexion in supine with min assist for guidance while holding onto a small foam mat.  She completed 2 sets of 10 reps.  Transitioned to sitting where she utilized the dowel rod for stretching of internal rotators and shoulder extension while sitting.  Pt reporting that her arm feels better after completion of stretching, however she still demonstrates limitations for pulling up clothing on the left side.  Finished session with pt transferring back to the wheelchair with min assist and returning to the room.  Call button and phone in reach with safety belt in place.    Therapy Documentation Precautions:  Precautions Precautions: Fall, Other (comment) Precaution Comments: watch gait belt placement - insulin pump Rt abdomen Restrictions Weight Bearing Restrictions: No  Pain: Pain Assessment Pain Scale: Faces Pain Score: 0-No pain ADL: See Care Tool Section for some details of mobility and selfcare  Therapy/Group: Individual Therapy  Marianela Mandrell OTR/L 08/26/2021, 12:24 PM

## 2021-08-27 LAB — GLUCOSE, CAPILLARY
Glucose-Capillary: 341 mg/dL — ABNORMAL HIGH (ref 70–99)
Glucose-Capillary: 149 mg/dL — ABNORMAL HIGH (ref 70–99)
Glucose-Capillary: 203 mg/dL — ABNORMAL HIGH (ref 70–99)
Glucose-Capillary: 253 mg/dL — ABNORMAL HIGH (ref 70–99)
Glucose-Capillary: 221 mg/dL — ABNORMAL HIGH (ref 70–99)

## 2021-08-27 MED ORDER — GLIPIZIDE 5 MG PO TABS
2.5000 mg | ORAL_TABLET | Freq: Every day | ORAL | Status: DC
  Administered 2021-08-30: 2.5 mg via ORAL
  Filled 2021-08-27 (×3): qty 1

## 2021-08-27 NOTE — Progress Notes (Signed)
Physical Therapy Session Note  Patient Details  Name: [name] MRN: [id number] Date of Birth: 1951-07-11  Today's Date: 08/27/2021 PT Individual Time: 0905-1000 PT Individual Time Calculation (min): 55 min   Short Term Goals: Week 3:  PT Short Term Goal 1 (Week 3): = to LTGs based on ELOS  Skilled Therapeutic Interventions/Progress Updates:    Pt received supine in bed and agreeable to therapy session, but reporting fatigue from the week of therapy. Pt requesting to wash-up EOB this morning. Supine>sitting R EOB, HOB partially elevated and using bedrail, with supervision and increased time/effort.   Sitting EOB demonstrating good trunk control though with L lean requiring distant supervision for safety (flattened bed to prevent elevated HOB from causing pelvic tilt and lean) uses R UE support on bedrail as needed. Completed doffing UB clothing mod-I and donning with min assist for pulling down clothing on L side - completed UB bathing with set-up and then total assist for her back.   Sit>stands from EOB (23inch height) to RW 6x during bathing and dressing tasks using R UE support on bedrail to push to stand and LUE pushing off bed with close supervision for safety - increased time to "get set" prior to coming to stand - demos good recall/carryover of sustaining anterior trunk lean.  Sitting EOB, using reacher able to thread on LB clothing and don tennis shoes with set-up assist.  Standing completed LB clothing management on/off hips with min assist to pull up clothing over L hip - close supervision for balance throughout with pt using either RW, bedrail, or backs of legs against bed to maintain her balance.  Short distance ~64f ambulatory transfer EOB>pt's personal power w/c using RW with close supervision/slight CGA for safety - continues to demo slow steps with pauses in double limb support to maintain balance. Power w/c mobility ~342fto sink with supervision and pt  demonstrating significant improvement in w/c mobility in room environment. Sitting in power w/c completed oral care with set-up assist. Pt left seated in power w/c with needs in reach and seat belt alarm on.   Therapy Documentation Precautions:  Precautions Precautions: Fall, Other (comment) Precaution Comments: watch gait belt placement - insulin pump Rt abdomen Restrictions Weight Bearing Restrictions: No   Pain:  Denies pain during session.   Therapy/Group: Individual Therapy  CaTawana Scale PT, DPT, NCS, CSRS 08/27/2021, 7:51 AM

## 2021-08-27 NOTE — Progress Notes (Addendum)
PROGRESS NOTE   Subjective/Complaints: Very pleasant "I'm doing alright" Used to teach math Was speaking with her granddaughter on the phone  [id number] CP, SOB, N/V/D  Objective:   No results found. No results for input(s): WBC, HGB, HCT, PLT in the last 72 hours.  No results for input(s): NA, K, CL, CO2, GLUCOSE, BUN, CREATININE, CALCIUM in the last 72 hours.   Intake/Output Summary (Last 24 hours) at 08/27/2021 1306 Last data filed at 08/27/2021 0830 Gross per 24 hour  Intake 358 ml  Output 2 ml  Net 356 ml     Pressure Injury 08/10/21 Heel Left;Lateral Unstageable - Full thickness tissue loss in which the base of the injury is covered by slough (yellow, tan, gray, green or brown) and/or eschar (tan, brown or black) in the wound bed. (Active)  08/10/21   Location: Heel  Location Orientation: Left;Lateral  Staging: Unstageable - Full thickness tissue loss in which the base of the injury is covered by slough (yellow, tan, gray, green or brown) and/or eschar (tan, brown or black) in the wound bed.  Wound Description (Comments):   Present on Admission: Yes    Physical Exam: Vital Signs Blood pressure (!) 130/57, pulse 64, temperature 98.2 F (36.8 C), temperature source Oral, resp. rate 16, height '5\' 7"'$  (1.702 m), weight 115.9 kg, SpO2 93 %. Gen: no distress, normal appearing HEENT: oral mucosa pink and moist, NCAT Cardio: Reg rate Chest: normal effort, normal rate of breathing Abd: soft, non-distended Ext: no edema Psych: pleasant, normal affect Skin: Warm and dry.  Intact. Psych: Normal mood.  Normal behavior. Musc: No edema in extremities.  No tenderness in extremities. Neuro: Alert and oriented Motor: Grossly 4/5 throughout, improving Left upper extremity mild ataxia, improving  Assessment/Plan: 1. Functional deficits which require 3+ hours per day of interdisciplinary therapy in a comprehensive inpatient  rehab setting. Physiatrist is providing close team supervision and 24 hour management of active medical problems listed below. Physiatrist and rehab team continue to assess barriers to discharge/monitor patient progress toward functional and medical goals  Care Tool:  Bathing    Body parts bathed by patient: Right arm, Left arm, Chest, Abdomen, Right upper leg, Left upper leg, Face, Front perineal area   Body parts bathed by helper: Buttocks Body parts n/a: Right lower leg, Left lower leg   Bathing assist Assist Level: Minimal Assistance - Patient > 75%     Upper Body Dressing/Undressing Upper body dressing   What is the patient wearing?: Pull over shirt, Bra    Upper body assist Assist Level: Supervision/Verbal cueing    Lower Body Dressing/Undressing Lower body dressing      What is the patient wearing?: Pants, Underwear/pull up     Lower body assist Assist for lower body dressing: Maximal Assistance - Patient 25 - 49%     Toileting Toileting    Toileting assist Assist for toileting: Minimal Assistance - Patient > 75%     Transfers Chair/bed transfer  Transfers assist     Chair/bed transfer assist level: Contact Guard/Touching assist Chair/bed transfer assistive device: Walker, Armrests, Bedrails   Locomotion Ambulation   Ambulation assist      Assist  level: Minimal Assistance - Patient > 75% Assistive device: Walker-rolling Max distance: 37f   Walk 10 feet activity   Assist     Assist level: Contact Guard/Touching assist Assistive device: Walker-rolling   Walk 50 feet activity   Assist Walk 50 feet with 2 turns activity did not occur: Safety/medical concerns  Assist level: Minimal Assistance - Patient > 75% Assistive device: Walker-rolling    Walk 150 feet activity   Assist Walk 150 feet activity did not occur: Safety/medical concerns         Walk 10 feet on uneven surface  activity   Assist Walk 10 feet on uneven surfaces  activity did not occur: Safety/medical concerns         Wheelchair     Assist Is the patient using a wheelchair?: Yes Type of Wheelchair: Power    Wheelchair assist level: Supervision/Verbal cueing Max wheelchair distance: 2022f   Wheelchair 50 feet with 2 turns activity    Assist        Assist Level: Supervision/Verbal cueing   Wheelchair 150 feet activity     Assist      Assist Level: Supervision/Verbal cueing   Blood pressure (!) 130/57, pulse 64, temperature 98.2 F (36.8 C), temperature source Oral, resp. rate 16, height '5\' 7"'$  (1.702 m), weight 115.9 kg, SpO2 93 %.  Medical Problem List and Plan: 1.  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  Antithrombotics: -DVT/anticoagulation: Eliquis Mechanical: Sequential compression devices, below knee Bilateral lower extremities Pharmaceutical: Lovenox             -antiplatelet therapy: N/A 3. Pain: continue Neurontin 100 mg 3 times daily  8/30- will try tramadol 50 mg BID prn for severe pain- explained won't help stiffness, but can help pain.   Controlled on 9/3 Monitor with increased exertion 4. Mood: Prozac 30 mg daily             -antipsychotic agents: N/A 5. Neuropsych: This patient is capable of making decisions on her own behalf. 6. Skin/Wound Care/W OC follow-up: Routine skin checks.  Left heel and right shin wounds 7. Fluids/Electrolytes/Nutrition: Routine in and outs   -Cr stable to improved. Probably baseline insufficiency   Creatinine 1.19 on 8/29, labs ordered for Monday      -pt is on equivalent of ocuvite (prosight)  8.  Hypertension.  Continue Norvasc 10 mg daily.         Vitals:   08/26/21 1922 08/27/21 0449  BP: 108/76 (!) 130/57  Pulse: 62 64  Resp: 16 16  Temp: 98.4 F (36.9 C) 98.2 F  (36.8 C)  SpO2: 99% 93%   9.  History of atrial fibrillation status post pacemaker.  Cardiac rate controlled.  Continue Eliquis  8/30- cannot use NSADIS due to Eliquis- FYI- told pt.  10.  Diabetes mellitus with peripheral neuropathy.  Patient does have an insulin pump that is being utilized          CBG (last 3)  Recent Labs    08/26/21 2031 08/27/21 0637 08/27/21 1147  GLUCAP 168* 341* 149*   9/3: start glipizide 2.'5mg'$ . Patient contacted endocrine to help adjust her insulin pump settings.  11.  Diastolic congestive heart failure.  Demadex 10 mg daily.  Monitor for any signs of fluid overload  Filed Weights   08/26/21 0337 08/27/21 0449 08/27/21 0500  Weight: 117.3 kg 115.5 kg 115.9 kg    Stable on 9/2 12.  Hyperlipidemia.  Lovaza/Lipitor 13.  Hypothyroidism.  Synthroid 14.  GERD.  Protonix 15.  Morbid obesity.  BMI 42.02.  Dietary follow-up.  Encourage weight loss 17.  OSA.  BiPAP  Discussed with nursing cleaning of BiPAP  18.  Leukopenia  WBC 3.6 on 8/29  Labs ordered for Monday  LOS: 17 days A FACE TO FACE EVALUATION WAS PERFORMED  [name] [name] 08/27/2021, 1:06 PM

## 2021-08-28 LAB — GLUCOSE, CAPILLARY
Glucose-Capillary: 147 mg/dL — ABNORMAL HIGH (ref 70–99)
Glucose-Capillary: 214 mg/dL — ABNORMAL HIGH (ref 70–99)
Glucose-Capillary: 146 mg/dL — ABNORMAL HIGH (ref 70–99)
Glucose-Capillary: 214 mg/dL — ABNORMAL HIGH (ref 70–99)
Glucose-Capillary: 293 mg/dL — ABNORMAL HIGH (ref 70–99)

## 2021-08-28 NOTE — Discharge Summary (Addendum)
Physician Discharge Summary  Patient ID: [name] MRN: [id number] DOB/AGE: [birth year] 70 y.o.  Admit date: 08/10/2021 Discharge date: 08/30/2021  Discharge Diagnoses:  Principal Problem:   Left pontine cerebrovascular accident [facility]) Active Problems:   Diabetic peripheral neuropathy ([place])   Essential hypertension   Leukopenia History of atrial fibrillation status post pacemaker Diabetes mellitus Diastolic congestive heart failure Hyperlipidemia Hypothyroidism GERD Morbid obesity OSA  Discharged Condition: Stable  Significant Diagnostic Studies: DG Chest 2 View  Result Date: 08/05/2021 CLINICAL DATA:  Dyspnea EXAM: CHEST - 2 VIEW COMPARISON:  Chest radiograph 1 day prior FINDINGS: A left chest wall cardiac device and loop recorder are unchanged. The heart is significantly enlarged, unchanged. The mediastinal contours are stable. There is calcified atherosclerotic plaque of the aortic arch. There is persistent confluent retrocardiac opacity on the AP view. On the lateral view, there are prominent patchy opacities felt more likely to reflect vasculature. The right lung is clear. There is no significant pleural effusion. There is no pneumothorax. There is vascular congestion without definite overt pulmonary edema. The bones are stable. IMPRESSION: 1. Patchy retrocardiac opacities are felt more likely to reflect bronchovascular structures; however, infection would be difficult to entirely exclude. 2. Cardiomegaly with vascular congestion; no definite overt pulmonary edema. Electronically Signed   By: Valetta Mole MD   On: 08/05/2021 09:54   CT HEAD WO CONTRAST (5MM)  Result Date: 08/04/2021 CLINICAL DATA:  Acute left lower extremity weakness. EXAM: CT HEAD WITHOUT CONTRAST TECHNIQUE: Contiguous axial images were obtained from the base of the skull through the vertex without intravenous contrast. COMPARISON:  March 07, 2020. FINDINGS: Brain: No evidence of acute infarction,  hemorrhage, hydrocephalus, extra-axial collection or mass lesion/mass effect. Vascular: No hyperdense vessel or unexpected calcification. Skull: Normal. Negative for fracture or focal lesion. Sinuses/Orbits: No acute finding. Other: None. IMPRESSION: No acute intracranial abnormality seen. Electronically Signed   By: Marijo Conception M.D.   On: 08/04/2021 18:08   MR Brain W and Wo Contrast  Result Date: 08/05/2021 CLINICAL DATA:  Neuro deficit, acute, stroke suspected EXAM: MRI HEAD WITHOUT AND WITH CONTRAST TECHNIQUE: Multiplanar, multiecho pulse sequences of the brain and surrounding structures were obtained without and with intravenous contrast. CONTRAST:  52m GADAVIST GADOBUTROL 1 MMOL/ML IV SOLN COMPARISON:  CT head 08/04/2021.  MRI October 14, 2018. FINDINGS: Brain: Small acute infarct in the left pons. No significant edema or mass effect. Prominent T2/FLAIR hyperintensity of the pons, likely representing severe microvascular ischemic change with multiple small remote pontine lacunar infarcts. Additional T2/FLAIR hyperintensity of the left middle cerebellar peduncle, likely representing Wallerian type degeneration with focal t2 hyperintensity in this region likely an additional remote lacunar infarct. Milder supratentorial chronic microvascular ischemic disease with scattered white matter T2 hyperintensities. No hydrocephalus. No acute hemorrhage. No extra-axial fluid collections. No midline shift. No abnormal enhancement. Punctate focus of susceptibility artifact in the right pons, compatible with prior microhemorrhage. Vascular: Major arterial flow voids are maintained at the skull base. Skull and upper cervical spine: Normal marrow signal. Sinuses/Orbits: Clear visualized sinuses.  Unremarkable orbits. Other: No sizable mastoid effusions. IMPRESSION: 1. Small acute infarct in the left pons. No significant edema or mass effect. 2. Severe chronic microvascular ischemic disease involving the pons and left  middle cerebellar peduncle with many remote lacunar infarcts, progressed from 2019. Milder chronic microvascular ischemic disease elsewhere. 3. Remote pontine microhemorrhage, potentially hypertensive. Electronically Signed   By: FMargaretha SheffieldM.D.   On: 08/05/2021 14:33   Portable  chest 1 View  Result Date: 08/04/2021 CLINICAL DATA:  Shortness of breath and leg weakness. EXAM: PORTABLE CHEST 1 VIEW COMPARISON:  August 17, 2020. FINDINGS: Left chest pacemaker with leads overlying the right atrium right ventricle. Left chest loop recorder device. Cardiomegaly with central vascular prominence. No overt pulmonary edema. Low lung volumes with bibasilar atelectasis. Hazy opacity projecting over the left lung favored to represent atelectasis. The visualized skeletal structures are unchanged. Surgical clips overlie the neck. IMPRESSION: Cardiomegaly with central vascular prominence, no overt pulmonary edema. Hazy opacity overlying the left lung favored to represent overlying atelectasis and overlying breast tissue. However, infiltrate not exclude. Consider further evaluation with dedicated two view chest radiograph if clinically indicated. Low lung volumes with bibasilar atelectasis. Electronically Signed   By: Dahlia Bailiff MD   On: 08/04/2021 23:37   ECHOCARDIOGRAM COMPLETE  Result Date: 08/05/2021    ECHOCARDIOGRAM REPORT   Patient Name:   [name] Lamp Date of Exam: 08/05/2021 Medical Rec #:  [id number]                  Height:       67.0 in Accession #:    [id number]                 Weight:       240.0 lb Date of Birth:  [birth year]                   BSA:          2.185 m Patient Age:    70 years                   BP:           103/36 mmHg Patient Gender: F                          HR:           48 bpm. Exam Location:  Inpatient Procedure: 2D Echo, Cardiac Doppler, Color Doppler and Intracardiac            Opacification Agent Indications:    Dyspnea R06.00  History:        Patient has prior  history of Echocardiogram examinations, most                 recent 03/08/2020. CHF, Pacemaker, Stroke, Arrythmias:Atrial                 Fibrillation, Signs/Symptoms:Murmur; Risk Factors:Hypertension,                 Diabetes and Dyslipidemia. Complete Heart Block. GERD.                 Hypothyroidism.  Sonographer:    Tiffany Dance RVT Referring Phys: [place]  1. Left ventricular ejection fraction, by estimation, is 60 to 65%. The left ventricle has normal function. The left ventricle has no regional wall motion abnormalities. There is mild left ventricular hypertrophy. Left ventricular diastolic parameters are indeterminate.  2. Right ventricular systolic function is normal. The right ventricular size is normal. There is normal pulmonary artery systolic pressure.  3. The mitral valve is normal in structure. No evidence of mitral valve regurgitation. No evidence of mitral stenosis.  4. The aortic valve is tricuspid. Aortic valve regurgitation is not visualized. Mild to moderate aortic valve sclerosis/calcification is present, without any evidence of aortic stenosis.  5.  The inferior vena cava is normal in size with greater than 50% respiratory variability, suggesting right atrial pressure of 3 mmHg. Comparison(s): No significant change from prior study. Prior images reviewed side by side. FINDINGS  Left Ventricle: Left ventricular ejection fraction, by estimation, is 60 to 65%. The left ventricle has normal function. The left ventricle has no regional wall motion abnormalities. Definity contrast agent was given IV to delineate the left ventricular  endocardial borders. The left ventricular internal cavity size was normal in size. There is mild left ventricular hypertrophy. Left ventricular diastolic parameters are indeterminate. Right Ventricle: The right ventricular size is normal. No increase in right ventricular wall thickness. Right ventricular systolic function is normal. There is  normal pulmonary artery systolic pressure. The tricuspid regurgitant velocity is 2.43 m/s, and  with an assumed right atrial pressure of 3 mmHg, the estimated right ventricular systolic pressure is 123XX123 mmHg. Left Atrium: Left atrial size was normal in size. Right Atrium: Right atrial size was normal in size. Pericardium: There is no evidence of pericardial effusion. Mitral Valve: The mitral valve is normal in structure. There is mild thickening of the mitral valve leaflet(s). There is mild calcification of the mitral valve leaflet(s). No evidence of mitral valve regurgitation. No evidence of mitral valve stenosis. Tricuspid Valve: The tricuspid valve is normal in structure. Tricuspid valve regurgitation is trivial. No evidence of tricuspid stenosis. Aortic Valve: The aortic valve is tricuspid. Aortic valve regurgitation is not visualized. Mild to moderate aortic valve sclerosis/calcification is present, without any evidence of aortic stenosis. Pulmonic Valve: The pulmonic valve was normal in structure. Pulmonic valve regurgitation is not visualized. No evidence of pulmonic stenosis. Aorta: The aortic root is normal in size and structure. Venous: The inferior vena cava is normal in size with greater than 50% respiratory variability, suggesting right atrial pressure of 3 mmHg. IAS/Shunts: No atrial level shunt detected by color flow Doppler. Additional Comments: A device lead is visualized in the right ventricle.  LEFT VENTRICLE PLAX 2D LVIDd:         4.30 cm LVIDs:         3.10 cm LV PW:         1.30 cm LV IVS:        1.50 cm LVOT diam:     2.10 cm LV SV:         73 LV SV Index:   34 LVOT Area:     3.46 cm  RIGHT VENTRICLE TAPSE (M-mode): 1.7 cm LEFT ATRIUM             Index LA diam:        4.80 cm 2.20 cm/m LA Vol (A2C):   50.4 ml 23.06 ml/m LA Vol (A4C):   44.0 ml 20.14 ml/m LA Biplane Vol: 48.9 ml 22.38 ml/m  AORTIC VALVE LVOT Vmax:   89.95 cm/s LVOT Vmean:  60.200 cm/s LVOT VTI:    0.212 m  AORTA Ao Root  diam: 3.10 cm Ao Asc diam:  3.15 cm MITRAL VALVE               TRICUSPID VALVE MV Area (PHT): 2.79 cm    TR Peak grad:   23.6 mmHg MV Decel Time: 272 msec    TR Vmax:        243.00 cm/s MV E velocity: 77.10 cm/s MV A velocity: 57.50 cm/s  SHUNTS MV E/A ratio:  1.34        Systemic VTI:  0.21 m  Systemic Diam: 2.10 cm Candee Furbish MD Electronically signed by Candee Furbish MD Signature Date/Time: 08/05/2021/1:01:37 PM    Final    VAS US CAROTID  Result Date: 08/09/2021 Carotid Arterial Duplex Study Patient Name:  [name] Jerolyn Shin [place]  Date of Exam:   08/06/2021 Medical Rec #: [id number]                   Accession #:    [id number] Date of Birth: [birth year]                    Patient Gender: F Patient Age:   9 years Exam Location:  [facility] Procedure:      VAS US CAROTID Referring Phys: TODD MCDIARMID --------------------------------------------------------------------------------  Indications:       CVA. Risk Factors:      Hypertension, Diabetes, prior CVA. Comparison Study:  no prior Performing Technologist: Archie Patten RVS  Examination Guidelines: A complete evaluation includes B-mode imaging, spectral Doppler, color Doppler, and power Doppler as needed of all accessible portions of each vessel. Bilateral testing is considered an integral part of a complete examination. Limited examinations for reoccurring indications may be performed as noted.  Right Carotid Findings: +----------+--------+--------+--------+------------------+--------+           PSV cm/sEDV cm/sStenosisPlaque DescriptionComments +----------+--------+--------+--------+------------------+--------+ CCA Prox  114     12              heterogenous               +----------+--------+--------+--------+------------------+--------+ CCA Distal75      11              heterogenous               +----------+--------+--------+--------+------------------+--------+ ICA Prox  76      13      1-39%    heterogenous               +----------+--------+--------+--------+------------------+--------+ ICA Distal89      23                                         +----------+--------+--------+--------+------------------+--------+ ECA       57                                                 +----------+--------+--------+--------+------------------+--------+ +----------+--------+-------+--------+-------------------+           PSV cm/sEDV cmsDescribeArm Pressure (mmHG) +----------+--------+-------+--------+-------------------+ Subclavian130                                        +----------+--------+-------+--------+-------------------+ +---------+--------+--+--------+-+---------+ VertebralPSV cm/s53EDV cm/s7Antegrade +---------+--------+--+--------+-+---------+  Left Carotid Findings: +----------+--------+--------+--------+------------------+--------+           PSV cm/sEDV cm/sStenosisPlaque DescriptionComments +----------+--------+--------+--------+------------------+--------+ CCA Prox  72      7               heterogenous               +----------+--------+--------+--------+------------------+--------+ CCA Distal60      8               heterogenous               +----------+--------+--------+--------+------------------+--------+  ICA Prox  62      14      1-39%   heterogenous      tortuous +----------+--------+--------+--------+------------------+--------+ ICA Distal32      9                                          +----------+--------+--------+--------+------------------+--------+ ECA       46                                                 +----------+--------+--------+--------+------------------+--------+ +----------+--------+--------+--------+-------------------+           PSV cm/sEDV cm/sDescribeArm Pressure (mmHG) +----------+--------+--------+--------+-------------------+ [id number]                                           +----------+--------+--------+--------+-------------------+ +---------+--------+--+--------+-+---------+ VertebralPSV cm/s38EDV cm/s5Antegrade +---------+--------+--+--------+-+---------+   Summary: Right Carotid: Velocities in the right ICA are consistent with a 1-39% stenosis. Left Carotid: Velocities in the left ICA are consistent with a 1-39% stenosis. Vertebrals: Bilateral vertebral arteries demonstrate antegrade flow. *See table(s) above for measurements and observations.  Electronically signed by Antony Contras MD on 08/09/2021 at 12:10:59 PM.    Final     Labs:  Basic Metabolic Panel: Recent Labs  Lab 08/29/21 1044  NA 138  K 4.0  CL 102  CO2 25  GLUCOSE 259*  BUN 23  CREATININE 1.36*  CALCIUM 9.4    CBC: Recent Labs  Lab 08/29/21 1044  WBC 3.3*  NEUTROABS 1.8  HGB 12.5  HCT 40.2  MCV 91.8  PLT 143*    CBG: Recent Labs  Lab 08/28/21 1639 08/28/21 2115 08/29/21 0205 08/29/21 0527 08/29/21 1127  GLUCAP 214* 293* 194* 216* 292*   Family history.  Mother with CAD hyperlipidemia hypertension and renal insufficiency.  Father with hyperlipidemia and hypertension.  Denies any colon cancer esophageal cancer or rectal cancer  Brief HPI:   [name] is a 70 y.o. 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  Lives with relatives multilevel home.  Ambulates short distances with a Rollator.  Granddaughter assists with ADLs.  Presented 08/04/2021 with increasing left-sided weakness and worsening dysarthria.  CT/MRI showed small acute infarct in the left pons no significant edema or mass-effect.  Severe chronic microvascular ischemic disease involving the pons and left middle cerebellar peduncle  with many remote lacunar infarcts progressed from 2019.  Remote pontine microhemorrhage.  Echocardiogram with ejection fraction of 60 to 65% no wall motion abnormalities.  Admission chemistries unremarkable.  Patient did not receive tPA.  Neurology follow-up maintained on Eliquis as prior to admission.  She did have an insulin pump utilized for diabetes.  Wound care nurse follow-up 08/05/2021 for left heel and right shin wound with skin care as directed.  Tolerating a regular consistency diet.  Therapy evaluations completed due to patient's left-sided weakness dysarthric speech was admitted for a comprehensive rehab program.   Hospital Course: Timmy Delahoussaye Idalie Schrotenboer was admitted to rehab 08/10/2021 for  inpatient therapies to consist of PT, ST and OT at least three hours five days a week. Past admission physiatrist, therapy team and rehab RN have worked together to provide customized collaborative inpatient rehab.  Pertaining to patient's left pontine infarct as well as history of remote lacunar infarct remained stable Eliquis as advised and would follow neurology services.  Pain managed with use of Neurontin as directed tramadol as needed.  Blood pressure controlled on Norvasc will need outpatient follow-up.  Wound care nurse did follow-up for left heel and right shin wounds with skin care as directed.  History of atrial fibrillation pacemaker cardiac rate controlled Eliquis ongoing she would follow-up cardiology services.  Diabetes mellitus peripheral neuropathy insulin pump utilized.  Patient also on glipizide endocrinology contacted to adjust insulin pump settings.  Diastolic congestive heart failure Demadex as directed monitoring for any signs of fluid overload.  Synthroid ongoing for hypothyroidism.  Morbid obesity BMI 42.0 to dietary follow-up.  OSA with CPAP/BiPAP as directed.   Blood pressures were monitored on TID basis and soft and monitored  Diabetes has been monitored with ac/hs CBG checks and  SSI was use prn for tighter BS control.    Rehab course: During patient's stay in rehab weekly team conferences were held to monitor patient's progress, set goals and discuss barriers to discharge. At admission, patient required moderate assist side-lying to sitting max assist sit to side-lying max assist sit to stand minimal assist upper body bathing minimal assist lower body dressing total assist lower body dressing  Physical exam.  Blood pressure 112/46 pulse 60 temperature 98.2 respirations 20 oxygen saturations 91% room air Constitutional.  No acute distress HEENT Head.  Normocephalic and atraumatic Eyes.  Pupils round and reactive to light no discharge.nystagmus Neck.  Supple nontender no JVD without thyromegaly Cardiac rate controlled Abdomen.  Soft nontender positive bowel sounds without rebound Respiratory effort normal no respiratory distress without wheeze Musculoskeletal.  Normal range of motion +1 lower extremity edema Neurologic.  Alert makes eye contact with examiner mild dysarthria.  Grossly graded 4+/5 throughout on left.  Sensation diminished light touch on left side.  No ataxia noted bilateral upper extremities.  He/She  has had improvement in activity tolerance, balance, postural control as well as ability to compensate for deficits. He/She has had improvement in functional use RUE/LUE  and RLE/LLE as well as improvement in awareness.  Supine to sitting edge of bed supervision with increased time and effort.  Sitting edge of bed demonstrated good trunk control though with a left lean required distant supervision.  Completed doffing upper body clothing modified independent and donning with minimal assist.  Standing completed lower body clothing management on and off hips with minimal assist.  Ambulates short distances 3 feet ambulatory transfers using personal power wheelchair rolling walker close supervision.  Full family teaching completed plan discharged home        Disposition: Discharged home    Diet: Regular  Special Instructions: No driving smoking or alcohol  Medications at discharge 1.  Tylenol as needed 2.  Norvasc 10 mg p.o. daily 3.  Eliquis 5 mg p.o. twice daily 4.  Lipitor 80 mg p.o. daily 5.  Prozac 30 mg p.o. daily 6.  Neurontin 100 mg p.o. 3 times daily 7.  Glucotrol 2.5 mg p.o. daily 8.  Insulin pump as directed 9.  Synthroid 175 mcg p.o. daily 10.  Claritin 10 mg p.o. daily 11.  Multivitamin daily 12.  Bactroban topical twice daily right anterior lower leg wound  13.  Lovaza a 1 g p.o. daily 14.  Protonix 40 mg p.o. twice daily 15.  MiraLAX daily 16.  Klor-Con 10 mEq p.o. daily 17.  Demadex 10 mg p.o. daily 18.  Tramadol 50 mg p.o. every 12 hours as needed pain  30-35 minutes were spent completing discharge summary and discharge planning  Discharge Instructions     Ambulatory referral to Neurology   Complete by: As directed    An appointment is requested in approximately: 4 weeks left pontine infarction   Ambulatory referral to Physical Medicine Rehab   Complete by: As directed    Moderate complexity follow-up 1 to 2 weeks left pontine infarction        Follow-up Information     Krystian Younglove, Luanna Salk, MD Follow up.   Specialty: Physical Medicine and Rehabilitation Why: Office to call for appointment Contact information: [place] [address] [phone]         Jacelyn Pi, MD Follow up.   Specialty: Endocrinology Why: call for appointment Contact information: [address] [place] [address] [phone]                 Signed: Lavon Paganini [place] 08/29/2021, 1:21 PM

## 2021-08-28 NOTE — Progress Notes (Signed)
Occupational Therapy Session Note  Patient Details  Name: [name] MRN: [id number] Date of Birth: [birth year]  Today's Date: 08/28/2021 OT Individual Time: 1638-4536 OT Individual Time Calculation (min): 54 min    Short Term Goals: Week 1:  OT Short Term Goal 1 (Week 1): Pt will complete UB dressing with supervision for two consecutive sessions. OT Short Term Goal 1 - Progress (Week 1): Not met OT Short Term Goal 2 (Week 1): Pt will complete LB bathing with min assist sit to stand with use of AE PRN. OT Short Term Goal 2 - Progress (Week 1): Met OT Short Term Goal 3 (Week 1): Pt will complete LB dressing with min assist using AE PRN. OT Short Term Goal 3 - Progress (Week 1): Not met OT Short Term Goal 4 (Week 1): Pt will complete toilet transfer with min assist using the RW and 3:1. OT Short Term Goal 4 - Progress (Week 1): Not met OT Short Term Goal 5 (Week 1): Pt will complete UB dressing with supervision for two consecutive sessions. OT Short Term Goal 5 - Progress (Week 1): Met Week 2:  OT Short Term Goal 1 (Week 2): Pt will complete LB dressing with min assist using AE PRN. OT Short Term Goal 1 - Progress (Week 2): Met OT Short Term Goal 2 (Week 2): Pt will complete toilet transfer with min assist using the RW and 3:1. OT Short Term Goal 2 - Progress (Week 2): Met OT Short Term Goal 3 (Week 2): Pt will complete UB dressing with supervision for two consecutive sessions. OT Short Term Goal 3 - Progress (Week 2): Not met Week 3:  OT Short Term Goal 1 (Week 3): Continue working on established LTGs set at supervision level overall.   Skilled Therapeutic Interventions/Progress Updates:    Pt greeted at time of session semireclined in bed resting agreeable to OT session, no pain. Wanting to do wash up and get dressed, able to direct her care well throughout session according to her routine. UB/LB bathing at EOB with Set up for UB and Min for LB to reach buttocks in  standing. Pt donning bra and shirt with Min A to fully get down on the back on L side, donned underwear and pants with reacher, cues for hemitechnique but pt stating she had her own way of putting R side in first, A to fully pull over buttocks in standing with Mod A overall for LB dressing. Pt with good safety awareness ensuring shoes with traction on at all times during standing for fall prevention. Stand pivot bed > power chair with CGA and RW, driving self around room for oral hygiene and grooming tasks. Set up in chair plugged into power with alarm on and all needs met.   Therapy Documentation Precautions:  Precautions Precautions: Fall, Other (comment) Precaution Comments: watch gait belt placement - insulin pump Rt abdomen Restrictions Weight Bearing Restrictions: No    Therapy/Group: Individual Therapy  Viona Gilmore 08/28/2021, 7:15 AM

## 2021-08-29 ENCOUNTER — Other Ambulatory Visit: Payer: Self-pay | Admitting: Family Medicine

## 2021-08-29 LAB — BASIC METABOLIC PANEL
Anion gap: 11 (ref 5–15)
BUN: 23 mg/dL (ref 8–23)
CO2: 25 mmol/L (ref 22–32)
Calcium: 9.4 mg/dL (ref 8.9–10.3)
Chloride: 102 mmol/L (ref 98–111)
Creatinine, Ser: 1.36 mg/dL — ABNORMAL HIGH (ref 0.44–1.00)
GFR, Estimated: 42 mL/min — ABNORMAL LOW (ref >60)
Glucose, Bld: 259 mg/dL — ABNORMAL HIGH (ref 70–99)
Potassium: 4 mmol/L (ref 3.5–5.1)
Sodium: 138 mmol/L (ref 135–145)

## 2021-08-29 LAB — GLUCOSE, CAPILLARY
Glucose-Capillary: 194 mg/dL — ABNORMAL HIGH (ref 70–99)
Glucose-Capillary: 216 mg/dL — ABNORMAL HIGH (ref 70–99)
Glucose-Capillary: 292 mg/dL — ABNORMAL HIGH (ref 70–99)
Glucose-Capillary: 518 mg/dL (ref 70–99)
Glucose-Capillary: 521 mg/dL (ref 70–99)
Glucose-Capillary: 426 mg/dL — ABNORMAL HIGH (ref 70–99)
Glucose-Capillary: 432 mg/dL — ABNORMAL HIGH (ref 70–99)
Glucose-Capillary: 378 mg/dL — ABNORMAL HIGH (ref 70–99)

## 2021-08-29 LAB — CBC WITH DIFFERENTIAL/PLATELET
Abs Immature Granulocytes: 0.01 10*3/uL (ref 0.00–0.07)
Basophils Absolute: 0 10*3/uL (ref 0.0–0.1)
Basophils Relative: 1 %
Eosinophils Absolute: 0.1 10*3/uL (ref 0.0–0.5)
Eosinophils Relative: 2 %
HCT: 40.2 % (ref 36.0–46.0)
Hemoglobin: 12.5 g/dL (ref 12.0–15.0)
Immature Granulocytes: 0 %
Lymphocytes Relative: 28 %
Lymphs Abs: 0.9 10*3/uL (ref 0.7–4.0)
MCH: 28.5 pg (ref 26.0–34.0)
MCHC: 31.1 g/dL (ref 30.0–36.0)
MCV: 91.8 fL (ref 80.0–100.0)
Monocytes Absolute: 0.5 10*3/uL (ref 0.1–1.0)
Monocytes Relative: 15 %
Neutro Abs: 1.8 10*3/uL (ref 1.7–7.7)
Neutrophils Relative %: 54 %
Platelets: 143 10*3/uL — ABNORMAL LOW (ref 150–400)
RBC: 4.38 MIL/uL (ref 3.87–5.11)
RDW: 13 % (ref 11.5–15.5)
WBC: 3.3 10*3/uL — ABNORMAL LOW (ref 4.0–10.5)
nRBC: 0 % (ref 0.0–0.2)

## 2021-08-29 NOTE — Plan of Care (Signed)
  Problem: Consults Goal: RH STROKE PATIENT EDUCATION Description: See Patient Education module for education specifics  Outcome: Progressing   Problem: RH BOWEL ELIMINATION Goal: RH STG MANAGE BOWEL WITH ASSISTANCE Description: STG Manage Bowel with mod I Assistance. Outcome: Progressing Goal: RH STG MANAGE BOWEL W/MEDICATION W/ASSISTANCE Description: STG Manage Bowel with Medication with mod I Assistance. Outcome: Progressing   Problem: RH BLADDER ELIMINATION Goal: RH STG MANAGE BLADDER WITH ASSISTANCE Description: STG Manage Bladder With min Assistance Outcome: Progressing Goal: RH STG MANAGE BLADDER WITH MEDICATION WITH ASSISTANCE Description: STG Manage Bladder With Medication With mod I Assistance. Outcome: Progressing Goal: RH STG MANAGE BLADDER WITH EQUIPMENT WITH ASSISTANCE Description: STG Manage Bladder With Equipment With min Assistance Outcome: Progressing   Problem: RH SKIN INTEGRITY Goal: RH STG SKIN FREE OF INFECTION/BREAKDOWN Description: With min assist Outcome: Progressing   Problem: RH SAFETY Goal: RH STG ADHERE TO SAFETY PRECAUTIONS W/ASSISTANCE/DEVICE Description: STG Adhere to Safety Precautions With  cues/reminders Assistance/Device. Outcome: Progressing   Problem: RH PAIN MANAGEMENT Goal: RH STG PAIN MANAGED AT OR BELOW PT'S PAIN GOAL Description: At or below level 4 Outcome: Progressing   Problem: RH KNOWLEDGE DEFICIT Goal: RH STG INCREASE KNOWLEDGE OF DIABETES Description: Patient will be able to manage diabetes with medications and dietary modifications using handouts and educational tools independently Outcome: Progressing Goal: RH STG INCREASE KNOWLEDGE OF HYPERTENSION Description: Patient will be able to manage HTN with medication and dietary modifications using handouts and educational tools independently Outcome: Progressing Goal: RH STG INCREASE KNOWLEGDE OF HYPERLIPIDEMIA Description: Patient will be able to manage HLD with medications  and dietary modifications using handouts and educational tools independently Outcome: Progressing Goal: RH STG INCREASE KNOWLEDGE OF STROKE PROPHYLAXIS Description: Patient will be able to manage secondary stroke risks with medications and dietary modifications using handouts and educational tools independently Outcome: Progressing   Problem: Education: Goal: Individualized Educational Video(s) Outcome: Progressing   Problem: Activity: Goal: Capacity to carry out activities will improve Outcome: Progressing   Problem: Cardiac: Goal: Ability to achieve and maintain adequate cardiopulmonary perfusion will improve Description: Patient will be able to manage care at discharge using handouts and educational tools with cues/reminders Outcome: Progressing

## 2021-08-29 NOTE — Progress Notes (Signed)
Patient placed on CPAP with new tubing and mask at patient request via FFM.  Patient on auto auto 20 cmH2O (max)- 5 cmH2O (min).  RT will continue to monitor

## 2021-08-29 NOTE — Progress Notes (Signed)
Physical Therapy Session Note  Patient Details  Name: [name] MRN: [id number] Date of Birth: [birth year]  Today's Date: 08/29/2021 PT Individual Time: [id number] PT Individual Time Calculation (min): 54 min   Short Term Goals: Week 3:  PT Short Term Goal 1 (Week 3): = to LTGs based on ELOS  Skilled Therapeutic Interventions/Progress Updates:    Pt received sitting in power wheelchair at sink completing oral care. Pt agreeable to therapy session. Power w/c mobility ~200-350f to/from therapy gym mod-I as pt demos significant improvement in management of wheelchair speed and navigating around obstacles, through doorways, and in her room. Simulated car transfer via stand pivot from power wheelchair with therapist reiterating education with family on importance of parallel parking power w/c next to the car so patient only has to perform a 90degree turn as opposed to parking w/c facing the car requiring 180degree turn - pt completed transfer with CGA while therapist stabilized car door, does require assist to get feet in/out of the simulator but do not anticipate this would be the case for a real car. Gait training ~378fup/down ramp using RW with CGA for steadying - cuing/education to ensure AD in line with ramp when first stepping up onto ramp or when stepping off, pt demos good understanding.Gait training ~5762fsing RW with CGA for steadying until the very end when pt turning to sit in w/c requires light min assist for steadying due to fatigue - pt continues to demo slow gait speed, wide BOS with increased time spent in double limb support to regain balance between each step. Therapist educated pt on BE FAST signs/symptoms of a stroke and when to call 911 - educated pt on utilizing handout in notebook. At end of session pt left seated in power w/c with needs in reach and seat belt alarm on.   Therapy Documentation Precautions:  Precautions Precautions: Fall, Other  (comment) Precaution Comments: watch gait belt placement - insulin pump Rt abdomen Restrictions Weight Bearing Restrictions: No   Pain: Denies pain during session.   Therapy/Group: Individual Therapy  CarTawana ScalePT, DPT, NCS, CSRS 08/29/2021, 7:56 AM

## 2021-08-29 NOTE — Plan of Care (Signed)
  Problem: Sit to Stand Goal: LTG:  Patient will perform sit to stand with assistance level (PT) Description: LTG:  Patient will perform sit to stand with assistance level (PT) Outcome: Not Met (add Reason)   Problem: RH Balance Goal: LTG Patient will maintain dynamic sitting balance (PT) Description: LTG:  Patient will maintain dynamic sitting balance with assistance during mobility activities (PT) Outcome: Completed/Met Goal: LTG Patient will maintain dynamic standing balance (PT) Description: LTG:  Patient will maintain dynamic standing balance with assistance during mobility activities (PT) Outcome: Completed/Met   Problem: RH Bed to Chair Transfers Goal: LTG Patient will perform bed/chair transfers w/assist (PT) Description: LTG: Patient will perform bed to chair transfers with assistance (PT). Outcome: Completed/Met   Problem: RH Car Transfers Goal: LTG Patient will perform car transfers with assist (PT) Description: LTG: Patient will perform car transfers with assistance (PT). Outcome: Completed/Met   Problem: RH Ambulation Goal: LTG Patient will ambulate in controlled environment (PT) Description: LTG: Patient will ambulate in a controlled environment, # of feet with assistance (PT). Outcome: Completed/Met Goal: LTG Patient will ambulate in home environment (PT) Description: LTG: Patient will ambulate in home environment, # of feet with assistance (PT). Outcome: Completed/Met   Problem: RH Wheelchair Mobility Goal: LTG Patient will propel w/c in controlled environment (PT) Description: LTG: Patient will propel wheelchair in controlled environment, # of feet with assist (PT) Outcome: Completed/Met Goal: LTG Patient will propel w/c in home environment (PT) Description: LTG: Patient will propel wheelchair in home environment, # of feet with assistance (PT). Outcome: Completed/Met

## 2021-08-29 NOTE — Progress Notes (Signed)
PROGRESS NOTE   Subjective/Complaints:  Pt changing site for insulin pump, states that CBGs usually are up prior to site change   [id number] CP, SOB, N/V/D  Objective:   No results found. No results for input(s): WBC, HGB, HCT, PLT in the last 72 hours.  No results for input(s): NA, K, CL, CO2, GLUCOSE, BUN, CREATININE, CALCIUM in the last 72 hours.   Intake/Output Summary (Last 24 hours) at 08/29/2021 1002 Last data filed at 08/29/2021 0835 Gross per 24 hour  Intake 1080 ml  Output 1 ml  Net 1079 ml      Pressure Injury 08/10/21 Heel Left;Lateral Unstageable - Full thickness tissue loss in which the base of the injury is covered by slough (yellow, tan, gray, green or brown) and/or eschar (tan, brown or black) in the wound bed. (Active)  08/10/21   Location: Heel  Location Orientation: Left;Lateral  Staging: Unstageable - Full thickness tissue loss in which the base of the injury is covered by slough (yellow, tan, gray, green or brown) and/or eschar (tan, brown or black) in the wound bed.  Wound Description (Comments):   Present on Admission: Yes    Physical Exam: Vital Signs Blood pressure (!) 123/51, pulse 67, temperature 98.9 F (37.2 C), resp. rate 14, height '5\' 7"'$  (1.702 m), weight 115.9 kg, SpO2 93 %.  General: No acute distress Mood and affect are appropriate Heart: Regular rate and rhythm no rubs murmurs or extra sounds Lungs: Clear to auscultation, breathing unlabored, no rales or wheezes Abdomen: Positive bowel sounds, soft nontender to palpation, nondistended Extremities: No clubbing, cyanosis, or edema Skin: No evidence of breakdown, no evidence of rash  MSK- limited shoulder abd and flexion bilaterally  Neuro: Alert and oriented Motor: Grossly 4/5 throughout, improving Left upper extremity mild ataxia, improving  Assessment/Plan: 1. Functional deficits which require 3+ hours per day of  interdisciplinary therapy in a comprehensive inpatient rehab setting. Physiatrist is providing close team supervision and 24 hour management of active medical problems listed below. Physiatrist and rehab team continue to assess barriers to discharge/monitor patient progress toward functional and medical goals  Care Tool:  Bathing    Body parts bathed by patient: Right arm, Left arm, Chest, Abdomen, Right upper leg, Left upper leg, Face, Front perineal area   Body parts bathed by helper: Buttocks Body parts n/a: Right lower leg, Left lower leg   Bathing assist Assist Level: Minimal Assistance - Patient > 75%     Upper Body Dressing/Undressing Upper body dressing   What is the patient wearing?: Pull over shirt, Bra    Upper body assist Assist Level: Supervision/Verbal cueing    Lower Body Dressing/Undressing Lower body dressing      What is the patient wearing?: Pants, Underwear/pull up     Lower body assist Assist for lower body dressing: Moderate Assistance - Patient 50 - 74%     Toileting Toileting    Toileting assist Assist for toileting: Minimal Assistance - Patient > 75%     Transfers Chair/bed transfer  Transfers assist     Chair/bed transfer assist level: Contact Guard/Touching assist Chair/bed transfer assistive device: Walker, Armrests, Bedrails   Locomotion Ambulation  Ambulation assist      Assist level: Minimal Assistance - Patient > 75% Assistive device: Walker-rolling Max distance: 71f   Walk 10 feet activity   Assist     Assist level: Contact Guard/Touching assist Assistive device: Walker-rolling   Walk 50 feet activity   Assist Walk 50 feet with 2 turns activity did not occur: Safety/medical concerns  Assist level: Minimal Assistance - Patient > 75% Assistive device: Walker-rolling    Walk 150 feet activity   Assist Walk 150 feet activity did not occur: Safety/medical concerns         Walk 10 feet on uneven surface   activity   Assist Walk 10 feet on uneven surfaces activity did not occur: Safety/medical concerns         Wheelchair     Assist Is the patient using a wheelchair?: Yes Type of Wheelchair: Power    Wheelchair assist level: Supervision/Verbal cueing Max wheelchair distance: 2072f   Wheelchair 50 feet with 2 turns activity    Assist        Assist Level: Supervision/Verbal cueing   Wheelchair 150 feet activity     Assist      Assist Level: Supervision/Verbal cueing   Blood pressure (!) 123/51, pulse 67, temperature 98.9 F (37.2 C), resp. rate 14, height '5\' 7"'$  (1.702 m), weight 115.9 kg, SpO2 93 %.  Medical Problem List and Plan: 1.  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  Antithrombotics: -DVT/anticoagulation: Eliquis Mechanical: Sequential compression devices, below knee Bilateral lower extremities Pharmaceutical: Lovenox             -antiplatelet therapy: N/A 3. Pain: continue Neurontin 100 mg 3 times daily  8/30- will try tramadol 50 mg BID prn for severe pain- explained won't help stiffness, but can help pain.   Controlled on 9/5 Monitor with increased exertion 4. Mood: Prozac 30 mg daily             -antipsychotic agents: N/A 5. Neuropsych: This patient is capable of making decisions on her own behalf. 6. Skin/Wound Care/W OC follow-up: Routine skin checks.  Left heel and right shin wounds 7. Fluids/Electrolytes/Nutrition: Routine in and outs   -Cr stable to improved. Probably baseline insufficiency   Creatinine 1.19 on 8/29, labs ordered for Monday      -pt is on equivalent of ocuvite (prosight)  8.  Hypertension.  Continue Norvasc 10 mg daily.         Vitals:   08/28/21 1932 08/29/21 0343  BP: (!) 133/50 (!) 123/51  Pulse: 62 67   Resp: 14 14  Temp: 98.2 F (36.8 C) 98.9 F (37.2 C)  SpO2: 98% 93%   9.  History of atrial fibrillation status post pacemaker.  Cardiac rate controlled.  Continue Eliquis  8/30- cannot use NSADIS due to Eliquis- FYI- told pt.  10.  Diabetes mellitus with peripheral neuropathy.  Patient does have an insulin pump that is being utilized          CBG (last 3)  Recent Labs    08/28/21 2115 08/29/21 0205 08/29/21 0527  GLUCAP 293* 194* 216*    9/3: start glipizide 2.'5mg'$ . Patient contacted endocrine to help adjust her insulin pump settings. Will need to f/u with Endo as OP  11.  Diastolic congestive heart failure.  Demadex 10 mg daily.  Monitor for any signs of fluid overload   Filed Weights   08/27/21 0500 08/28/21 0315 08/29/21 0600  Weight: 115.9 kg 116.4 kg 115.9 kg     Stable on 9/2 12.  Hyperlipidemia.  Lovaza/Lipitor 13.  Hypothyroidism.  Synthroid 14.  GERD.  Protonix 15.  Morbid obesity.  BMI 42.02.  Dietary follow-up.  Encourage weight loss 17.  OSA.  BiPAP  Discussed with nursing cleaning of BiPAP  18.  Leukopenia  WBC 3.6 on 8/29  Labs ordered for Monday  LOS: 19 days A FACE TO FACE EVALUATION WAS PERFORMED  Charlett Blake 08/29/2021, 10:02 AM

## 2021-08-29 NOTE — Progress Notes (Signed)
Occupational Therapy Discharge Summary  Patient Details  Name: [name] MRN: [id number] Date of Birth: [birth year]  Today's Date: 08/29/2021 OT Individual Time: 0802-0900 OT Individual Time Calculation (min): 58 min   Session Note:  Pt worked on bathing and dressing sit to stand from the EOB.  Supervision for transfer to sitting from supine with min assist for initial sit to stand from the EOB to remove LB clothing.  All other sit to stand transitions were min to min guard overall.  She was able to complete all UB selfcare with setup and LB selfcare at min assist.  Reacher was used for unthreading and threading all clothing as well as the sockaide for donning her compression socks from home.  Therapist placed the sock on the sockaide and then she was able to complete donning it.  She was able to use the reacher for donning her slip on shoes with laces tied.  Transferred to the power wheelchair with min assist using the RW for support.  She was given a handout for reference to LUE stretching and AAROM exercises for the shoulder with therapist going over them briefly.  Call button and phone in reach with pt requesting to go to the bathroom and nursing made aware to conclude session.   Patient has met 4 of 10 long term goals due to improved activity tolerance, improved balance, postural control, ability to compensate for deficits, functional use of  LEFT upper extremity, and improved coordination.  Patient to discharge at [facility] Assist level.  Patient's care partner unavailable to provide the necessary physical assistance at discharge.    Reasons goals not met: Pt needs min guard for transfers and sit to stand with LB selfcare secondary to decreased balance and LE strength  Recommendation:  Patient will benefit from ongoing skilled OT services in home health setting to continue to advance functional skills in the area of BADL and Reduce care partner burden.  Pt will benefit from  follow-up HHOT at discharge secondary to still needing min assist to min guard for LB selfcare and functional transfers.  Feel she will need 24 hr supervision for transfers to the toilet as well as any sit to stand or LB selfcare.  This has been expressed to the pt and family even though no plans have been made to accommodate this.    Equipment: No equipment provided  Reasons for discharge: treatment goals met and discharge from hospital  Patient/family agrees with progress made and goals achieved: Yes  OT Discharge Precautions/Restrictions  Precautions Precautions: Fall;Other (comment) Precaution Comments: watch gait belt placement - insulin pump Rt abdomen Restrictions Weight Bearing Restrictions: No  Pain Pain Assessment Pain Scale: 0-10 Pain Score: 0-No pain ADL ADL Eating: Independent Where Assessed-Eating: Wheelchair Grooming: Modified independent Where Assessed-Grooming: Wheelchair Upper Body Bathing: Supervision/safety Where Assessed-Upper Body Bathing: Edge of bed Lower Body Bathing: Contact guard Where Assessed-Lower Body Bathing: Edge of bed Upper Body Dressing: Setup Where Assessed-Upper Body Dressing: Edge of bed Lower Body Dressing: Contact guard Where Assessed-Lower Body Dressing: Edge of bed Toileting: Contact guard Where Assessed-Toileting: Bedside Commode Toilet Transfer: Therapist, music Method: Stand pivot Science writer: Radiographer, therapeutic: Not assessed Social research officer, government: Not assessed Vision Baseline Vision/History: 1 Wears glasses Wears Glasses: Reading only Patient Visual Report: No change from baseline Vision Assessment?: Yes Eye Alignment: Within Functional Limits Ocular Range of Motion: Within Functional Limits Tracking/Visual Pursuits: Decreased smoothness of horizontal tracking;Decreased smoothness of vertical tracking Saccades: Within  functional limits Convergence: Within functional  limits Visual Fields: No apparent deficits Perception  Perception: Within Functional Limits Praxis Praxis: Intact Cognition Overall Cognitive Status: Within Functional Limits for tasks assessed Arousal/Alertness: Awake/alert Year: 2022 Month: September Day of Week: Correct Attention: Focused;Sustained Focused Attention: Appears intact Sustained Attention: Appears intact Memory: Appears intact Immediate Memory Recall: Sock;Blue;Bed Memory Recall Sock: Without Cue Memory Recall Blue: Without Cue Memory Recall Bed: Without Cue Awareness: Impaired Problem Solving: Appears intact Safety/Judgment: Appears intact Sensation Sensation Light Touch: Impaired Detail Light Touch Impaired Details: Impaired LUE Hot/Cold: Impaired Detail Hot/Cold Impaired Details: Impaired LUE Proprioception: Appears Intact Stereognosis: Not tested Additional Comments: Slight decreased light touch acuity in the LUE compared to the rightas well as decreased ability to detect hot and cold but still able to detect with proprioception being WNLs. Coordination Gross Motor Movements are Fluid and Coordinated: No Fine Motor Movements are Fluid and Coordinated: No Coordination and Movement Description: Decreased LUE function compared to right with decreased smoothness of movements with functional reach and impaired ability to efficiently pull up clothing on the left side.  Uses the LUE at a diminshed level. 9 Hole Peg Test: R= 39, L= 51 Motor  Motor Motor: Hemiplegia;Abnormal postural alignment and control Motor - Discharge Observations: Pt still with slight LUE and 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  Limited flexibility as well for transitional movements including sit to stand and rolling. Mobility  Bed Mobility Bed Mobility: Supine to Sit;Sit to Supine Supine to Sit: Supervision/Verbal cueing Sit to Supine: Supervision/Verbal cueing Transfers Sit to Stand: Contact Guard/Touching assist Stand to Sit:  Contact Guard/Touching assist  Trunk/Postural Assessment  Cervical Assessment Cervical Assessment: Exceptions to [facility] (forward head) Thoracic Assessment Thoracic Assessment: Exceptions to [facility] (thoracic rounding) Lumbar Assessment Lumbar Assessment: Exceptions to [facility] (posterior pelvic tilt with decreased AROM lumbar flexion)  Balance Balance Balance Assessed: Yes Static Sitting Balance Static Sitting - Balance Support: Feet supported Static Sitting - Level of Assistance: 6: Modified independent (Device/Increase time) Dynamic Sitting Balance Dynamic Sitting - Balance Support: During functional activity Dynamic Sitting - Level of Assistance: 6: Modified independent (Device/Increase time) Static Standing Balance Static Standing - Balance Support: During functional activity;Bilateral upper extremity supported Static Standing - Level of Assistance: 5: Stand by assistance Dynamic Standing Balance Dynamic Standing - Balance Support: During functional activity;Bilateral upper extremity supported Dynamic Standing - Level of Assistance: 4: Min assist Extremity/Trunk Assessment RUE Assessment RUE Assessment: Exceptions to [facility] Active Range of Motion (AROM) Comments: AROM shoulder flexion 0-110 degrees, decreased internal rotation for reaching behind the back as well. General Strength Comments: strength 4/5 throughout LUE Assessment LUE Assessment: Exceptions to [facility] Passive Range of Motion (PROM) Comments: shoulder flexion 0-110 with history of left shoulder fx per her report Active Range of Motion (AROM) Comments: WFLS for elbow flexion/extension, wrist flexion/extension and gross digit flexion/extension.  Limited AROM internal rotation for reaching behind her back. General Strength Comments: Strength 4/5 throoughout with decreased finger to nose speed compared to the right as well as slight ataxia.  Decreased FM coordination as well but uses at a diminshed level for selfcare  tasks.   Zenna Traister OTR/L 08/29/2021, 12:49 PM

## 2021-08-29 NOTE — Progress Notes (Addendum)
[name] read 518, pt reports that her pump was disconnected.  Assisted with reconnecting pump, pt gave herself a bolus of 5.  Algis Liming, PA made aware. Order received to monitor bgl and give pt water.   At 1850 BGL is now 426. [name]

## 2021-08-29 NOTE — Progress Notes (Signed)
Physical Therapy Discharge Summary  Patient Details  Name: [name] MRN: [id number] Date of Birth: January 28, 1951  Today's Date: 08/29/2021 PT Individual Time: 1645-1700 PT Individual Time Calculation (min): 15 min   and  Today's Date: 08/29/2021 PT Missed Time: 60 Minutes Missed Time Reason: Patient fatigue;Nursing care   Patient has met 8 of 9 long term goals due to improved activity tolerance, improved balance, improved postural control, increased strength, ability to compensate for deficits, functional use of  left upper extremity and left lower extremity, improved attention, improved awareness, and improved coordination.  Patient to discharge at a power wheelchair level Modified Independent requiring CGA for transfers using RW.  Patient's care partner requires assistance to provide the necessary physical assistance at discharge.  Reasons goals not met: Patient continues to require CGA for steadying during sit<>stand transfers.  Recommendation:  Patient will benefit from ongoing skilled PT services in home health setting to continue to advance safe functional mobility, address ongoing impairments in B LE strength, standing balance, gait, activity tolerance, and minimize fall risk.  Equipment: No equipment provided - pt already has RW and power wheelchair  Reasons for discharge: treatment goals met and discharge from hospital  Patient/family agrees with progress made and goals achieved: Yes  Skilled Therapeutic Interventions/Progress Updates:  Pt received sitting in power w/c with her glucose meter app open on her cell phone next to her reading "high" because it cannot register numbers >400. NT present to assess blood sugar with it reading 518 - pt lifted shirt and noticed that insulin pump had been disconnected from her abdomen. Nurse notified and present to ensure patient properly connected a new insulin pump to her abdomen and to ensure patient appropriately provided  herself a bolus of insulin via her pump. Therapist educated patient on stroke support group information and pt able to recall education on BE FAST signs/symptoms of a stroke that was reviewed during earlier therapy session. Nurse reports PA requesting frequent blood sugar checks and pt requesting to rest at this time. Pt left seated in her personal power w/c with needs in reach and nurse present. Missed 45 minutes of skilled physical therapy.  PT Discharge Precautions/Restrictions Precautions Precautions: Fall;Other (comment) Precaution Comments: watch gait belt placement - insulin pump Rt abdomen Restrictions Weight Bearing Restrictions: No Pain Pain Assessment Pain Scale: 0-10 Pain Score: 0-No pain Pain Interference Pain Interference Pain Effect on Sleep: 1. Rarely or not at all (has pain first thing in the morning from arthritis but not at night when trying to sleep) Pain Interference with Therapy Activities: 1. Rarely or not at all Pain Interference with Day-to-Day Activities: 1. Rarely or not at all Vision/Perception  Vision - History Ability to See in Adequate Light: 0 Adequate Perception Perception: Within Functional Limits Praxis Praxis: Intact  Cognition Overall Cognitive Status: Within Functional Limits for tasks assessed Arousal/Alertness: Awake/alert Orientation Level: Oriented X4 Year: 2022 Month: September Day of Week: Correct Attention: Focused;Sustained Focused Attention: Appears intact Sustained Attention: Appears intact Memory: Appears intact Awareness: Impaired Problem Solving: Appears intact Safety/Judgment: Appears intact Sensation Sensation Light Touch: Impaired Detail Peripheral sensation comments: hx of peripheral neuropathy - pt reports increased pain sensitivity to touch in B LEs Light Touch Impaired Details: Impaired LUE (though pt reports increased sensation in L UE compared to at initial eval) Hot/Cold: Not tested Proprioception: Impaired  Detail Proprioception Impaired Details: Impaired LLE Stereognosis: Not tested Coordination Gross Motor Movements are Fluid and Coordinated: No Coordination and Movement Description: impaired gross motor  movements in B LEs due to weakness, impaired sensation, and increased joint "stiffness" Motor  Motor Motor: Hemiplegia;Abnormal postural alignment and control Motor - Discharge Observations: Pt still with slight LUE and 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  Limited flexibility as well for transitional movements including sit to stand and rolling.  Mobility Bed Mobility Bed Mobility: Supine to Sit;Sit to Supine Supine to Sit: Supervision/Verbal cueing Sit to Supine: Supervision/Verbal cueing Transfers Transfers: Sit to Stand;Stand to Sit;Stand Pivot Transfers Sit to Stand: Contact Guard/Touching assist Stand to Sit: Contact Guard/Touching assist Stand Pivot Transfers: Contact Guard/Touching assist Stand Pivot Transfer Details: Verbal cues for precautions/safety;Verbal cues for technique;Verbal cues for sequencing Transfer (Assistive device): Rolling walker Locomotion  Gait Ambulation: Yes Gait Assistance: Contact Guard/Touching assist Gait Distance (Feet): 50 Feet Assistive device: Rolling walker Gait Assistance Details: Verbal cues for precautions/safety;Verbal cues for sequencing;Verbal cues for technique;Verbal cues for gait pattern;Tactile cues for weight shifting Gait Gait: Yes Gait Pattern: Impaired Gait Pattern: Step-through pattern;Decreased hip/knee flexion - right;Decreased hip/knee flexion - left;Wide base of support;Decreased weight shift to left;Decreased stride length Gait velocity: decreased with pauses in double limb support Stairs / Additional Locomotion Stairs: No Ramp: Contact Guard/touching assist (ambulating with RW) Pick up small object from the floor assist level: Minimal Assistance - Patient > 75% Pick up small object from the floor assistive device: Armed forces technical officer Mobility: Yes Wheelchair Assistance: Independent with assistive device (power w/c) Wheelchair Propulsion: Right upper extremity Wheelchair Parts Management: Independent Banker) Distance: >223f  Trunk/Postural Assessment  Cervical Assessment Cervical Assessment: Ex[facility] to [facility] Thoracic Assessment Thoracic Assessment: [facility] to [facility](thoracic and shoulder rounding) Lumbar Assessment Lumbar Assessment: Exceptions to [facility](posterior pelvic tilt with decreased AROM lumbar flexion)  Balance Balance Balance Assessed: Yes Static Sitting Balance Static Sitting - Balance Support: Feet supported Static Sitting - Level of Assistance: 6: Modified independent (Device/Increase time) Dynamic Sitting Balance Dynamic Sitting - Balance Support: During functional activity Dynamic Sitting - Level of Assistance: 6: Modified independent (Device/Increase time) Static Standing Balance Static Standing - Balance Support: During functional activity;Bilateral upper extremity supported Static Standing - Level of Assistance: 5: Stand by assistance Dynamic Standing Balance Dynamic Standing - Balance Support: During functional activity;Bilateral upper extremity supported Dynamic Standing - Level of Assistance: 4: Min assist Extremity Assessment      RLE Assessment RLE Assessment: Ex[facility] to [facility] ([facility]) Comments: general "stiffness" in her LE movements but WFL ROM RLE Strength Right Hip Flexion: 4+/5 Right Knee Flexion: 4+/5 Right Knee Extension: 4+/5 Right Ankle Dorsiflexion: 4+/5 Right Ankle Plantar Flexion: 4+/5 LLE Assessment LLE Assessment: Exceptions to [facility] Motion (AROM) Comments: general "stiffness" in her LE movements but WFL ROM except slight decreased L ankle DF ROM LLE Strength Left Hip Flexion: 4/5 Left Knee Flexion: 4+/5 Left Knee Extension: 4/5 Left Ankle Dorsiflexion: 4/5 Left  Ankle Plantar Flexion: 4/5    Tanay Massiah M Neosha Switalski , PT, DPT, NCS, CSRS 08/29/2021, 7:55 AM

## 2021-08-29 NOTE — Progress Notes (Signed)
[name] now 432, dan, pa made aware.  Pt to dosed herself via insulin pump. Pt gave herself a bolus of 3.5.

## 2021-08-29 NOTE — Progress Notes (Signed)
Physical Therapy Session Note  Patient Details  Name: [name] MRN: [id number] Date of Birth: [birth year]  Today's Date: 08/29/2021 PT Individual Time: 1452-1531  PT Individual Time Calculation (min): 39 min    Short Term Goals: Week 3:  PT Short Term Goal 1 (Week 3): = to LTGs based on ELOS  Skilled Therapeutic Interventions/Progress Updates:  Patient seated upright in w//c on entrance to room. Patient alert and agreeable to PT session. Although relates not feeling well d/t mixup between taking Vit D3 and Omega 3 Fish Oil as she has one here in the hospital and the other one is at home. Related to pt that she will be home in one day and will be able to pick up home regimen again as prescribed by her PCP. Will be important to take the D3 if prescribed by her PCP. Pt taken outside for session to improve general mood.   Patient with no pain complaint throughout session.  Therapeutic Activity: Transfers: Patient performed sit<>stand and stand pivot transfers throughout session with CGA and extra time. Provided verbal cues for technique and forward lean.  Wheelchair Mobility:  Patient propelled power wheelchair from room into elevator, pressed buttons for floor,  and maneuvered chair through atrium and out to courtyard with no issues/ problems. Controlled speed well and managed all obstacles well. Late to make a few turns but was able to correct with no problems. Provided vc for directions.  Therapeutic Exercise: Discussed therex with pt that were presented by lead therapist. Pt able to relate 80% of exercises with use of theraband from memory.   Patient guided in performance of below therex for BLE with 1# aw and vc/ tc for proper technique. 1 x10 with therapeutic rest break provided as needed. Focus on slow eccentric return. LAQ, marches, heel/ toe raises PT resisted abd/add and hamstring curls  While outside, pt demos some mild emotional lability and relates that she is  just happy to be outside, aware that she is doing well enough to be going home, but will miss everyone here who has helped her to progress to this point.   Patient seated upright  in w/c at end of session with brakes locked, be;lt alarm set, and all needs within reach.     Therapy Documentation Precautions:  Precautions Precautions: Fall, Other (comment) Precaution Comments: watch gait belt placement - insulin pump Rt abdomen Restrictions Weight Bearing Restrictions: No General:    Pain: Pain Assessment Pain Scale: 0-10 Pain Score: 0-No pain  Therapy/Group: Individual Therapy  Alger Simons PT, DPT 08/29/2021, 1:02 PM

## 2021-08-30 LAB — GLUCOSE, CAPILLARY
Glucose-Capillary: 293 mg/dL — ABNORMAL HIGH (ref 70–99)
Glucose-Capillary: 219 mg/dL — ABNORMAL HIGH (ref 70–99)

## 2021-08-30 MED ORDER — MUPIROCIN 2 % EX OINT: TOPICAL_OINTMENT | Freq: Two times a day (BID) | CUTANEOUS | 0 refills | Status: DC

## 2021-08-30 MED ORDER — PANTOPRAZOLE SODIUM 40 MG PO TBEC: 40.0000 mg | DELAYED_RELEASE_TABLET | Freq: Two times a day (BID) | ORAL | 3 refills | Status: DC

## 2021-08-30 MED ORDER — ATORVASTATIN CALCIUM 80 MG PO TABS: 80.0000 mg | ORAL_TABLET | Freq: Every evening | ORAL | 2 refills | Status: DC

## 2021-08-30 MED ORDER — OMEGA-3-ACID ETHYL ESTERS 1 G PO CAPS: 1.0000 g | ORAL_CAPSULE | Freq: Every day | ORAL | 0 refills | Status: DC

## 2021-08-30 MED ORDER — TRAMADOL HCL 50 MG PO TABS: 50.0000 mg | ORAL_TABLET | Freq: Two times a day (BID) | ORAL | 0 refills | Status: DC | PRN

## 2021-08-30 MED ORDER — GLIPIZIDE 5 MG PO TABS: 2.5000 mg | ORAL_TABLET | Freq: Every day | ORAL | 0 refills | Status: DC

## 2021-08-30 MED ORDER — GABAPENTIN 100 MG PO CAPS: 100.0000 mg | ORAL_CAPSULE | Freq: Three times a day (TID) | ORAL | 0 refills | Status: DC

## 2021-08-30 MED ORDER — LEVOTHYROXINE SODIUM 175 MCG PO TABS: 175.0000 ug | ORAL_TABLET | Freq: Every day | ORAL | 1 refills | Status: DC

## 2021-08-30 MED ORDER — POTASSIUM CHLORIDE CRYS ER 10 MEQ PO TBCR
10.0000 meq | EXTENDED_RELEASE_TABLET | Freq: Every day | ORAL | 1 refills | Status: DC
Start: 2021-08-30 — End: 2021-10-27

## 2021-08-30 MED ORDER — FLUOXETINE HCL 10 MG PO CAPS: 30.0000 mg | ORAL_CAPSULE | Freq: Every day | ORAL | 3 refills | Status: DC

## 2021-08-30 MED ORDER — APIXABAN 5 MG PO TABS
5.0000 mg | ORAL_TABLET | Freq: Two times a day (BID) | ORAL | 9 refills | Status: DC
Start: 2021-08-30 — End: 2021-10-27

## 2021-08-30 MED ORDER — TORSEMIDE 10 MG PO TABS: 10.0000 mg | ORAL_TABLET | Freq: Every day | ORAL | 0 refills | Status: DC

## 2021-08-30 MED ORDER — ACETAMINOPHEN 325 MG PO TABS: 650.0000 mg | ORAL_TABLET | Freq: Four times a day (QID) | ORAL | Status: DC | PRN

## 2021-08-30 MED ORDER — AMLODIPINE BESYLATE 10 MG PO TABS: 10.0000 mg | ORAL_TABLET | Freq: Every day | ORAL | 1 refills | Status: DC

## 2021-08-30 NOTE — Progress Notes (Signed)
Discharge Note:  Patient alert and oriented; compliant with medication administration prior to DC.No signs or symptoms of distress noted prior to discharge.  Discharge instructions and medication review provided by PA. Patient discharged home via private car accompanied by family.

## 2021-08-30 NOTE — Progress Notes (Signed)
Patient cbg was 378. Pt received a bolus of 2.1 via insulin pump. Notified Dan A. PA. No new orders received. STAT order for Beta-Hydroxybutyric acid not initiated per Linna Hoff.  A. PA.

## 2021-08-30 NOTE — Progress Notes (Signed)
PROGRESS NOTE   Subjective/Complaints:  Pt states pump needle became disconnected yesterday pm resulting in elevated CBG  Discussed need for close endo f/u post d/c [id number] CP, SOB, N/V/D  Objective:   No results found. Recent Labs    08/29/21 1044  WBC 3.3*  HGB 12.5  HCT 40.2  PLT 143*    Recent Labs    08/29/21 1044  NA 138  K 4.0  CL 102  CO2 25  GLUCOSE 259*  BUN 23  CREATININE 1.36*  CALCIUM 9.4     Intake/Output Summary (Last 24 hours) at 08/30/2021 0836 Last data filed at 08/30/2021 0725 Gross per 24 hour  Intake 840 ml  Output --  Net 840 ml      Pressure Injury 08/10/21 Heel Left;Lateral Unstageable - Full thickness tissue loss in which the base of the injury is covered by slough (yellow, tan, gray, green or brown) and/or eschar (tan, brown or black) in the wound bed. (Active)  08/10/21   Location: Heel  Location Orientation: Left;Lateral  Staging: Unstageable - Full thickness tissue loss in which the base of the injury is covered by slough (yellow, tan, gray, green or brown) and/or eschar (tan, brown or black) in the wound bed.  Wound Description (Comments):   Present on Admission: Yes    Physical Exam: Vital Signs Blood pressure (!) 122/52, pulse (!) 57, temperature 98.6 F (37 C), temperature source Oral, resp. rate 14, height '5\' 7"'$  (1.702 m), weight 115.9 kg, SpO2 93 %.  General: No acute distress Mood and affect are appropriate Heart: Regular rate and rhythm no rubs murmurs or extra sounds Lungs: Clear to auscultation, breathing unlabored, no rales or wheezes Abdomen: Positive bowel sounds, soft nontender to palpation, nondistended Extremities: No clubbing, cyanosis, or edema Skin: No evidence of breakdown, no evidence of rash  MSK- limited shoulder abd and flexion bilaterally  Neuro: Alert and oriented Motor: BUE and BLE 4/5 throughout, unchanged Left upper extremity mild  ataxia, improving  Assessment/Plan: 1. 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 Stable for D/C today F/u PCP in 3-4 weeks F/u PM&R 2 weeks See D/C summary See D/C instructions   Care Tool:  Bathing    Body parts bathed by patient: Right arm, Left arm, Chest, Abdomen, Right upper leg, Left upper leg, Face, Front perineal area, Left lower leg, Right lower leg   Body parts bathed by helper: Buttocks Body parts n/a: Right lower leg, Left lower leg   Bathing assist Assist Level: Minimal Assistance - Patient > 75%     Upper Body Dressing/Undressing Upper body dressing   What is the patient wearing?: Pull over shirt, Bra    Upper body assist Assist Level: Set up assist    Lower Body Dressing/Undressing Lower body dressing      What is the patient wearing?: Pants, Underwear/pull up     Lower body assist Assist for lower body dressing: Contact Guard/Touching assist     Toileting Toileting    Toileting assist Assist for toileting: Contact Guard/Touching assist     Transfers Chair/bed transfer  Transfers assist     Chair/bed transfer assist level: Contact Guard/Touching assist Chair/bed transfer assistive device:  Walker, Armrests, Bedrails   Locomotion Ambulation   Ambulation assist      Assist level: Contact Guard/Touching assist Assistive device: Walker-rolling Max distance: 70f   Walk 10 feet activity   Assist     Assist level: Contact Guard/Touching assist Assistive device: Walker-rolling   Walk 50 feet activity   Assist Walk 50 feet with 2 turns activity did not occur: Safety/medical concerns  Assist level: Contact Guard/Touching assist Assistive device: Walker-rolling    Walk 150 feet activity   Assist Walk 150 feet activity did not occur: Safety/medical concerns         Walk 10 feet on uneven surface  activity   Assist Walk 10 feet on uneven surfaces activity did not occur: Safety/medical concerns   Assist level:  Contact Guard/Touching assist Assistive device: Walker-rolling   Wheelchair     Assist Is the patient using a wheelchair?: Yes Type of Wheelchair: Power    Wheelchair assist level: Independent Max wheelchair distance: >2037f   Wheelchair 50 feet with 2 turns activity    Assist        Assist Level: Independent   Wheelchair 150 feet activity     Assist      Assist Level: Independent   Blood pressure (!) 122/52, pulse (!) 57, temperature 98.6 F (37 C), temperature source Oral, resp. rate 14, height '5\' 7"'$  (1.702 m), weight 115.9 kg, SpO2 93 %.  Medical Problem List and Plan: 1.  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  Antithrombotics: -DVT/anticoagulation: Eliquis Mechanical: Sequential compression devices, below knee Bilateral lower extremities Pharmaceutical: Lovenox             -antiplatelet therapy: N/A 3. Pain: continue Neurontin 100 mg 3 times daily  8/30- will try tramadol 50 mg BID prn for severe pain- explained won't help stiffness, but can help pain.   Controlled on 9/6 Monitor with increased exertion 4. Mood: Prozac 30 mg daily             -antipsychotic agents: N/A 5. Neuropsych: This patient is capable of making decisions on her own behalf. 6. Skin/Wound Care/W OC follow-up: Routine skin checks.  Left heel and right shin wounds 7. Fluids/Electrolytes/Nutrition: Routine in and outs   -Cr stable to improved. Probably baseline insufficiency   Creatinine 1.19 on 8/29, labs ordered for Monday      -pt is on equivalent of ocuvite (prosight)  8.  Hypertension.  Continue Norvasc 10 mg daily.         Vitals:   08/30/21 0343 08/30/21 0352  BP: (!) 107/40 (!) 122/52  Pulse: (!) 58 (!) 57  Resp: 14   Temp: 98.6 F (37 C)    SpO2: 93%   Controlled 9/6 9.  History of atrial fibrillation status post pacemaker.  Cardiac rate controlled.  Continue Eliquis  8/30- cannot use NSADIS due to Eliquis- FYI- told pt.  10.  Diabetes mellitus with peripheral neuropathy.  Patient does have an insulin pump that is being utilized          CBG (last 3)  Recent Labs    08/29/21 2139 08/30/21 0159 08/30/21 0602  GLUCAP 378* 293* 219*    Will need to f/u with Endo as OP  11.  Diastolic congestive  heart failure.  Demadex 10 mg daily.  Monitor for any signs of fluid overload   Filed Weights   08/27/21 0500 08/28/21 0315 08/29/21 0600  Weight: 115.9 kg 116.4 kg 115.9 kg     Stable on 9/6 12.  Hyperlipidemia.  Lovaza/Lipitor 13.  Hypothyroidism.  Synthroid 14.  GERD.  Protonix 15.  Morbid obesity.  BMI 42.02.  Dietary follow-up.  Encourage weight loss 17.  OSA.  BiPAP  Discussed with nursing cleaning of BiPAP  18.  Leukopenia  WBC 3.6 on 8/29  Repeat stable at 3.3K  LOS: 20 days A FACE TO [place] E Zandyr Barnhill 08/30/2021, 8:36 AM

## 2021-09-01 DIAGNOSIS — I69322 Dysarthria following cerebral infarction: Secondary | ICD-10-CM | POA: Diagnosis not present

## 2021-09-01 DIAGNOSIS — I130 Hypertensive heart and chronic kidney disease with heart failure and stage 1 through stage 4 chronic kidney disease, or unspecified chronic kidney disease: Secondary | ICD-10-CM | POA: Diagnosis not present

## 2021-09-01 DIAGNOSIS — N183 Chronic kidney disease, stage 3 unspecified: Secondary | ICD-10-CM | POA: Diagnosis not present

## 2021-09-01 DIAGNOSIS — I5030 Unspecified diastolic (congestive) heart failure: Secondary | ICD-10-CM | POA: Diagnosis not present

## 2021-09-01 DIAGNOSIS — I69354 Hemiplegia and hemiparesis following cerebral infarction affecting left non-dominant side: Secondary | ICD-10-CM | POA: Diagnosis not present

## 2021-09-01 DIAGNOSIS — M160 Bilateral primary osteoarthritis of hip: Secondary | ICD-10-CM | POA: Diagnosis not present

## 2021-09-01 DIAGNOSIS — E1142 Type 2 diabetes mellitus with diabetic polyneuropathy: Secondary | ICD-10-CM | POA: Diagnosis not present

## 2021-09-01 DIAGNOSIS — E1122 Type 2 diabetes mellitus with diabetic chronic kidney disease: Secondary | ICD-10-CM | POA: Diagnosis not present

## 2021-09-01 DIAGNOSIS — M170 Bilateral primary osteoarthritis of knee: Secondary | ICD-10-CM | POA: Diagnosis not present

## 2021-09-06 ENCOUNTER — Telehealth: Payer: Self-pay | Admitting: Physical Medicine & Rehabilitation

## 2021-09-06 ENCOUNTER — Encounter: Payer: Self-pay | Admitting: Physical Medicine & Rehabilitation

## 2021-09-06 ENCOUNTER — Other Ambulatory Visit: Payer: Self-pay

## 2021-09-06 ENCOUNTER — Encounter: Payer: Medicare PPO | Attending: Physical Medicine & Rehabilitation | Admitting: Physical Medicine & Rehabilitation

## 2021-09-06 VITALS — BP 97/63 | HR 68 | Temp 98.2°F

## 2021-09-06 DIAGNOSIS — I130 Hypertensive heart and chronic kidney disease with heart failure and stage 1 through stage 4 chronic kidney disease, or unspecified chronic kidney disease: Secondary | ICD-10-CM | POA: Diagnosis not present

## 2021-09-06 DIAGNOSIS — I639 Cerebral infarction, unspecified: Secondary | ICD-10-CM | POA: Diagnosis not present

## 2021-09-06 DIAGNOSIS — M170 Bilateral primary osteoarthritis of knee: Secondary | ICD-10-CM | POA: Diagnosis not present

## 2021-09-06 DIAGNOSIS — I5030 Unspecified diastolic (congestive) heart failure: Secondary | ICD-10-CM | POA: Diagnosis not present

## 2021-09-06 DIAGNOSIS — M160 Bilateral primary osteoarthritis of hip: Secondary | ICD-10-CM | POA: Diagnosis not present

## 2021-09-06 DIAGNOSIS — I69354 Hemiplegia and hemiparesis following cerebral infarction affecting left non-dominant side: Secondary | ICD-10-CM | POA: Diagnosis not present

## 2021-09-06 DIAGNOSIS — N183 Chronic kidney disease, stage 3 unspecified: Secondary | ICD-10-CM | POA: Diagnosis not present

## 2021-09-06 DIAGNOSIS — I69322 Dysarthria following cerebral infarction: Secondary | ICD-10-CM | POA: Diagnosis not present

## 2021-09-06 DIAGNOSIS — E1142 Type 2 diabetes mellitus with diabetic polyneuropathy: Secondary | ICD-10-CM | POA: Diagnosis not present

## 2021-09-06 DIAGNOSIS — E1122 Type 2 diabetes mellitus with diabetic chronic kidney disease: Secondary | ICD-10-CM | POA: Diagnosis not present

## 2021-09-06 NOTE — Telephone Encounter (Signed)
Approval given

## 2021-09-06 NOTE — Telephone Encounter (Signed)
Sharyn Lull OT with [facility] needs verbal orders to see patient 1w1, 2w3 and 1w2.  Please call her at [phone].

## 2021-09-06 NOTE — Progress Notes (Signed)
Subjective:    Patient ID: [name], female    DOB: 1951-11-01, 70 y.o.   MRN: [id number] 70 y.o. 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  Lives with relatives multilevel home.  Ambulates short distances with a Rollator.  Granddaughter assists with ADLs.  Presented 08/04/2021 with increasing left-sided weakness and worsening dysarthria.  CT/MRI showed small acute infarct in the left pons no significant edema or mass-effect.  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  Remote pontine microhemorrhage.  Echocardiogram with ejection fraction of 60 to 65% no wall motion abnormalities.  Admission chemistries unremarkable.  Patient did not receive tPA.  Neurology follow-up maintained on Eliquis as prior to admission.  She did have an insulin pump utilized for diabetes.  Wound care nurse follow-up 08/05/2021 for left heel and right shin wound with skin care as directed.  Tolerating a regular consistency diet.  Therapy evaluations completed due to patient's left-sided weakness dysarthric speech was admitted for a comprehensive rehab program  Admit date: 08/10/2021 Discharge date: 08/30/2021 HPI HHPT started last week, HHOT started this week Mod I dressing and bathing  Amb with walker with standby assist, walks ~ 1/4 way around living niece estimates this at ~50' Appt with PCP next week Has Phone visit with Endocrinology scheduled for diabetic management , also wants her thyroid hormone levels checked  Appt with Neuro Oct 25th Appt with Podiatry on 9/30  Left shoulder stiff since fracture in July 2020 Pain  Inventory Average Pain 3 Pain Right Now 0 My pain is intermittent and aching  LOCATION OF PAIN  back  BOWEL Number of stools per week: 7 Oral laxative use No  Type of laxative . Enema or suppository use No  History of colostomy No  Incontinent No   BLADDER Normal In and out cath, frequency na Able to self cath  na Bladder incontinence Yes  Frequent urination No  Leakage with coughing Yes  Difficulty starting stream Yes  Incomplete bladder emptying No    Mobility use a walker how many minutes can you walk?  5 ability to climb steps?  no do you drive?  no use a wheelchair transfers alone  Function retired  Neuro/Psych bladder control problems bowel control problems numbness trouble walking anxiety  Prior Studies TC appt  Physicians involved in your care TC appt   Family History  Problem Relation Age of Onset   Heart disease Mother    Hyperlipidemia Mother    Hypertension Mother    Renal Disease Mother        insufficiency   Heart attack Mother        in his 97's   Early death Father        Head Injury at Work   Hyperlipidemia Father    Hypertension Father    Stroke Son    Diabetes Maternal Aunt    Prostate cancer Paternal Uncle    Heart disease Maternal Aunt    Heart failure Maternal Grandfather    Heart disease Maternal Grandfather    Heart attack Maternal Grandfather        Died in his 72's   Breast cancer Neg Hx    Social History   Socioeconomic History  Marital status: Divorced    Spouse name: Not on file   Number of children: 1   Years of education: Not on file   Highest education level: Not on file  Occupational History   Occupation: retired Pharmacist, hospital  Tobacco Use   Smoking status: Never   Smokeless tobacco: Never  Vaping Use   Vaping Use: Never used  Substance and Sexual Activity   Alcohol use: No   Drug use: No   Sexual activity: Not on file  Other Topics Concern   Not on file  Social History Narrative   Retired  Pharmacist, hospital, high school teacher taught mass. One son, helping to care for her granddaughter. No caffeine. Updated 07/20/2014.   Social Determinants of Health   Financial Resource Strain: Not on file  Food Insecurity: No Food Insecurity   Worried About Charity fundraiser in the Last Year: Never true   Ran Out of Food in the Last Year: Never true  Transportation Needs: Not on file  Physical Activity: Not on file  Stress: Not on file  Social Connections: Not on file   Past Surgical History:  Procedure Laterality Date   ABDOMINAL ANGIOGRAM N/A 05/14/2012   Procedure: ABDOMINAL ANGIOGRAM;  Surgeon: Laverda Page, MD;  Location: [facility] CATH LAB;  Service: Cardiovascular;  Laterality: N/A;   ABDOMINAL HYSTERECTOMY  1995   partial   ANKLE FRACTURE SURGERY Right    CARDIAC CATHETERIZATION     with coronary angiogram   cataract  Bilateral 2018   COLONOSCOPY N/A 09/09/2014   Procedure: COLONOSCOPY;  Surgeon: Gatha Mayer, MD;  Location: [place];  Service: Endoscopy;  Laterality: N/A;   ESOPHAGOGASTRODUODENOSCOPY (EGD) WITH PROPOFOL N/A 12/03/2018   Procedure: ESOPHAGOGASTRODUODENOSCOPY (EGD) WITH PROPOFOL;  Surgeon: Doran Stabler, MD;  Location: [facility];  Service: Gastroenterology;  Laterality: N/A;   LEFT AND RIGHT HEART CATHETERIZATION WITH CORONARY ANGIOGRAM N/A 05/14/2012   Procedure: LEFT AND RIGHT HEART CATHETERIZATION WITH CORONARY ANGIOGRAM;  Surgeon: Laverda Page, MD;  Location: [facility] CATH LAB;  Service: Cardiovascular;  Laterality: N/A;   LOOP RECORDER INSERTION N/A 12/23/2018   Procedure: LOOP RECORDER INSERTION;  Surgeon: Constance Haw, MD;  Location: [place] CV LAB;  Service: Cardiovascular;  Laterality: N/A;   LOOP RECORDER REMOVAL N/A 06/25/2020   Procedure: LOOP RECORDER REMOVAL;  Surgeon: Constance Haw, MD;  Location: [place] CV LAB;  Service: Cardiovascular;  Laterality: N/A;   MINI METER   09/08/2014   ELECTRONIC INSULIN PUMP   PACEMAKER  IMPLANT N/A 06/25/2020   Procedure: PACEMAKER IMPLANT;  Surgeon: Constance Haw, MD;  Location: [place] CV LAB;  Service: Cardiovascular;  Laterality: N/A;   TEE WITHOUT CARDIOVERSION N/A 10/16/2018   Procedure: TRANSESOPHAGEAL ECHOCARDIOGRAM (TEE);  Surgeon: Buford Dresser, MD;  Location: [facility] ENDOSCOPY;  Service: Cardiovascular;  Laterality: N/A;   THYROIDECTOMY  2006   Past Medical History:  Diagnosis Date   Abnormal echocardiogram    Anemia    Arthritis    BPPV (benign paroxysmal positional vertigo), right 06/13/2017   Bradycardia 03/07/2020   Cerebrovascular accident (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 ([place]) 10/29/2015   CHB (complete heart block) ([place]) 03/08/2020   CHF (congestive heart failure) ([place])    Closed fracture of left proximal humerus 07/08/2019   Decreased range of motion of left shoulder 09/03/2020   Depression    Dizzy 03/07/2020   bradycardia   Edentulous 09/03/2020   GERD (gastroesophageal reflux disease)    Hemiparesis  affecting left side as late effect of cerebrovascular accident (CVA) ([place]) 03/08/2020   Hemorrhoids    History of Falls with injury 03/08/2020   Fall resulting in left humeral fracture   Hyperlipidemia    Hypertension    Hypothyroidism    Intracranial vascular stenosis    Lacunar infarction ([place])    Brain MRI 07/2021 multiple small remote pontine lacunar infarcts   Near syncope 03/08/2020   Paroxysmal atrial fibrillation (HCC)    Presence of permanent cardiac pacemaker    Proteinuria 03/08/2020   Seasonal allergies    Stroke [facility])    Thyroid cancer ([place])    per pt S/p Total Thyroidectomy with Radioactive Iodine Therapy   Type 1 diabetes mellitus (HCC)    BP 97/63   Pulse 68   Temp 98.2 F (36.8 C) (Oral)   SpO2 97%   Opioid Risk Score:   Fall Risk Score:  `1  Depression screen PHQ 2/9  Depression screen [facility] 2/9 06/29/2021 02/15/2021 01/03/2021 08/10/2020 07/28/2020 06/02/2020 05/05/2020  Decreased Interest 0 0 0 0 0  0 0  Down, Depressed, Hopeless 0 0 0 0 0 0 0  PHQ - 2 Score 0 0 0 0 0 0 0  Altered sleeping 0 0 3 0 1 - -  Tired, decreased energy 0 0 0 0 0 - -  Change in appetite 0 0 0 0 0 - -  Feeling bad or failure about yourself  0 0 0 0 0 - -  Trouble concentrating 0 0 0 0 0 - -  Moving slowly or fidgety/restless 0 0 0 0 0 - -  Suicidal thoughts 0 0 0 0 0 - -  PHQ-9 Score 0 0 3 0 1 - -  Difficult doing work/chores - - - - - - -  Some recent data might be hidden     Review of Systems  Constitutional:  Positive for unexpected weight change.  Gastrointestinal:  Positive for constipation.  Genitourinary:  Positive for difficulty urinating and frequency.  Musculoskeletal:  Positive for gait problem.  Neurological:  Positive for numbness.  All other systems reviewed and are negative.     Objective:   Physical Exam Vitals and nursing note reviewed.  Constitutional:      Appearance: She is obese.  HENT:     Head: Normocephalic and atraumatic.  Eyes:     Extraocular Movements: Extraocular movements intact.     Conjunctiva/sclera: Conjunctivae normal.     Pupils: Pupils are equal, round, and reactive to light.  Cardiovascular:     Rate and Rhythm: Normal rate and regular rhythm.  Pulmonary:     Effort: Pulmonary effort is normal.     Breath sounds: Normal breath sounds.  Abdominal:     General: Bowel sounds are normal. There is no distension.     Palpations: Abdomen is soft.  Musculoskeletal:        General: No swelling or tenderness.     Comments: Reduced range of motion left shoulder with abduction and forward flexion to around 90 degrees, external rotation is normal  No pain with lower extremity range of motion Negative straight leg raising test bilaterally.  Skin:    General: Skin is warm and dry.  Neurological:     Mental Status: She is alert and oriented to person, place, and time.     Comments: Positive dysdiadochokinesis left alternating supination and pronation Motor  strength is 4 - in the left deltoid within available range, bicep tricep grip  4 - at the left hip flexion extensor ankle dorsiflexor 4/5 in the right side same muscle groups. Deep tendon reflexes are hyporeflexic in the upper extremities and bilateral knees absent at the ankles bilaterally. Sensation intact to light touch in the upper extremities. Did not have walker did not ambulate in the office  Psychiatric:        Mood and Affect: Mood normal.        Behavior: Behavior normal.          Assessment & Plan:  1.  History of prior left cerebellar peduncle infarct with more recent left pontine infarct without significant residual right-sided weakness .  Nearly at her functional baseline.  She has used [place] chronically for community ambulation as well as around the house at times.  She does try to get up and walk with her family assisting her with close supervision/contact-guard assist 2.  Chronic shoulder pain status post comminuted fracture approximately 2 years ago with residual frozen shoulder. Continue home health PT OT, follow-up in 4 to 6 weeks

## 2021-09-09 DIAGNOSIS — I130 Hypertensive heart and chronic kidney disease with heart failure and stage 1 through stage 4 chronic kidney disease, or unspecified chronic kidney disease: Secondary | ICD-10-CM | POA: Diagnosis not present

## 2021-09-09 DIAGNOSIS — N183 Chronic kidney disease, stage 3 unspecified: Secondary | ICD-10-CM | POA: Diagnosis not present

## 2021-09-09 DIAGNOSIS — I5030 Unspecified diastolic (congestive) heart failure: Secondary | ICD-10-CM | POA: Diagnosis not present

## 2021-09-09 DIAGNOSIS — E1142 Type 2 diabetes mellitus with diabetic polyneuropathy: Secondary | ICD-10-CM | POA: Diagnosis not present

## 2021-09-09 DIAGNOSIS — I69354 Hemiplegia and hemiparesis following cerebral infarction affecting left non-dominant side: Secondary | ICD-10-CM | POA: Diagnosis not present

## 2021-09-09 DIAGNOSIS — I69322 Dysarthria following cerebral infarction: Secondary | ICD-10-CM | POA: Diagnosis not present

## 2021-09-09 DIAGNOSIS — M170 Bilateral primary osteoarthritis of knee: Secondary | ICD-10-CM | POA: Diagnosis not present

## 2021-09-09 DIAGNOSIS — E1122 Type 2 diabetes mellitus with diabetic chronic kidney disease: Secondary | ICD-10-CM | POA: Diagnosis not present

## 2021-09-09 DIAGNOSIS — M160 Bilateral primary osteoarthritis of hip: Secondary | ICD-10-CM | POA: Diagnosis not present

## 2021-09-12 DIAGNOSIS — I69322 Dysarthria following cerebral infarction: Secondary | ICD-10-CM | POA: Diagnosis not present

## 2021-09-12 DIAGNOSIS — E1122 Type 2 diabetes mellitus with diabetic chronic kidney disease: Secondary | ICD-10-CM | POA: Diagnosis not present

## 2021-09-12 DIAGNOSIS — M160 Bilateral primary osteoarthritis of hip: Secondary | ICD-10-CM | POA: Diagnosis not present

## 2021-09-12 DIAGNOSIS — I5030 Unspecified diastolic (congestive) heart failure: Secondary | ICD-10-CM | POA: Diagnosis not present

## 2021-09-12 DIAGNOSIS — M170 Bilateral primary osteoarthritis of knee: Secondary | ICD-10-CM | POA: Diagnosis not present

## 2021-09-12 DIAGNOSIS — I69354 Hemiplegia and hemiparesis following cerebral infarction affecting left non-dominant side: Secondary | ICD-10-CM | POA: Diagnosis not present

## 2021-09-12 DIAGNOSIS — I130 Hypertensive heart and chronic kidney disease with heart failure and stage 1 through stage 4 chronic kidney disease, or unspecified chronic kidney disease: Secondary | ICD-10-CM | POA: Diagnosis not present

## 2021-09-12 DIAGNOSIS — N183 Chronic kidney disease, stage 3 unspecified: Secondary | ICD-10-CM | POA: Diagnosis not present

## 2021-09-12 DIAGNOSIS — E1142 Type 2 diabetes mellitus with diabetic polyneuropathy: Secondary | ICD-10-CM | POA: Diagnosis not present

## 2021-09-12 DIAGNOSIS — E1065 Type 1 diabetes mellitus with hyperglycemia: Secondary | ICD-10-CM | POA: Diagnosis not present

## 2021-09-13 DIAGNOSIS — E1142 Type 2 diabetes mellitus with diabetic polyneuropathy: Secondary | ICD-10-CM | POA: Diagnosis not present

## 2021-09-13 DIAGNOSIS — M170 Bilateral primary osteoarthritis of knee: Secondary | ICD-10-CM | POA: Diagnosis not present

## 2021-09-13 DIAGNOSIS — C73 Malignant neoplasm of thyroid gland: Secondary | ICD-10-CM | POA: Diagnosis not present

## 2021-09-13 DIAGNOSIS — E1122 Type 2 diabetes mellitus with diabetic chronic kidney disease: Secondary | ICD-10-CM | POA: Diagnosis not present

## 2021-09-13 DIAGNOSIS — E7800 Pure hypercholesterolemia, unspecified: Secondary | ICD-10-CM | POA: Diagnosis not present

## 2021-09-13 DIAGNOSIS — I5030 Unspecified diastolic (congestive) heart failure: Secondary | ICD-10-CM | POA: Diagnosis not present

## 2021-09-13 DIAGNOSIS — I130 Hypertensive heart and chronic kidney disease with heart failure and stage 1 through stage 4 chronic kidney disease, or unspecified chronic kidney disease: Secondary | ICD-10-CM | POA: Diagnosis not present

## 2021-09-13 DIAGNOSIS — Z794 Long term (current) use of insulin: Secondary | ICD-10-CM | POA: Diagnosis not present

## 2021-09-13 DIAGNOSIS — M160 Bilateral primary osteoarthritis of hip: Secondary | ICD-10-CM | POA: Diagnosis not present

## 2021-09-13 DIAGNOSIS — I639 Cerebral infarction, unspecified: Secondary | ICD-10-CM | POA: Diagnosis not present

## 2021-09-13 DIAGNOSIS — N183 Chronic kidney disease, stage 3 unspecified: Secondary | ICD-10-CM | POA: Diagnosis not present

## 2021-09-13 DIAGNOSIS — I69322 Dysarthria following cerebral infarction: Secondary | ICD-10-CM | POA: Diagnosis not present

## 2021-09-13 DIAGNOSIS — E890 Postprocedural hypothyroidism: Secondary | ICD-10-CM | POA: Diagnosis not present

## 2021-09-13 DIAGNOSIS — I69354 Hemiplegia and hemiparesis following cerebral infarction affecting left non-dominant side: Secondary | ICD-10-CM | POA: Diagnosis not present

## 2021-09-13 DIAGNOSIS — E109 Type 1 diabetes mellitus without complications: Secondary | ICD-10-CM | POA: Diagnosis not present

## 2021-09-13 DIAGNOSIS — Z9641 Presence of insulin pump (external) (internal): Secondary | ICD-10-CM | POA: Diagnosis not present

## 2021-09-13 DIAGNOSIS — I10 Essential (primary) hypertension: Secondary | ICD-10-CM | POA: Diagnosis not present

## 2021-09-13 DIAGNOSIS — E11319 Type 2 diabetes mellitus with unspecified diabetic retinopathy without macular edema: Secondary | ICD-10-CM | POA: Diagnosis not present

## 2021-09-14 ENCOUNTER — Ambulatory Visit: Payer: Medicare PPO | Admitting: Family Medicine

## 2021-09-14 ENCOUNTER — Telehealth: Payer: Self-pay | Admitting: Family Medicine

## 2021-09-14 DIAGNOSIS — I5030 Unspecified diastolic (congestive) heart failure: Secondary | ICD-10-CM | POA: Diagnosis not present

## 2021-09-14 DIAGNOSIS — E1122 Type 2 diabetes mellitus with diabetic chronic kidney disease: Secondary | ICD-10-CM | POA: Diagnosis not present

## 2021-09-14 DIAGNOSIS — E1142 Type 2 diabetes mellitus with diabetic polyneuropathy: Secondary | ICD-10-CM | POA: Diagnosis not present

## 2021-09-14 DIAGNOSIS — I69322 Dysarthria following cerebral infarction: Secondary | ICD-10-CM | POA: Diagnosis not present

## 2021-09-14 DIAGNOSIS — M170 Bilateral primary osteoarthritis of knee: Secondary | ICD-10-CM | POA: Diagnosis not present

## 2021-09-14 DIAGNOSIS — M160 Bilateral primary osteoarthritis of hip: Secondary | ICD-10-CM | POA: Diagnosis not present

## 2021-09-14 DIAGNOSIS — I69354 Hemiplegia and hemiparesis following cerebral infarction affecting left non-dominant side: Secondary | ICD-10-CM | POA: Diagnosis not present

## 2021-09-14 DIAGNOSIS — I130 Hypertensive heart and chronic kidney disease with heart failure and stage 1 through stage 4 chronic kidney disease, or unspecified chronic kidney disease: Secondary | ICD-10-CM | POA: Diagnosis not present

## 2021-09-14 DIAGNOSIS — N183 Chronic kidney disease, stage 3 unspecified: Secondary | ICD-10-CM | POA: Diagnosis not present

## 2021-09-14 NOTE — Telephone Encounter (Signed)
**  After Hours/ Emergency Line Call**  Received a call to report that [name] wants to talk her PCP about her financial aid paperwork. I informed her that I am unable to reach [name] at this time. Offered to send message to PCP and have patient to call the clinic tomorrow. She stated, "you guys are pretending to be something your not", "do I have to wait until the morning to talk to my doctor?". I reiterated that I will send the message over. She stated "I will need your boss, the bald guy to sign my paperwork." I acknowledged and stated we can have an attending sign her paperwork if need be. Will forward to PCP.  Gerlene Fee, DO PGY-3, [facility] 09/14/2021 8:06 PM

## 2021-09-15 ENCOUNTER — Other Ambulatory Visit: Payer: Self-pay | Admitting: Family Medicine

## 2021-09-16 ENCOUNTER — Telehealth: Payer: Self-pay | Admitting: Family Medicine

## 2021-09-16 NOTE — Telephone Encounter (Signed)
Patient returns call to nurse line. Patient would like returned call from provider at new number at [phone].  Talbot Grumbling, RN

## 2021-09-16 NOTE — Telephone Encounter (Signed)
Attempted to contact patient via telephone in order to follow-up discussion from after-hours call regarding financial paperwork.  No answer.  Left voicemail with instructions for patient to call on 9/26 after 8:30 AM to discuss further.  Eulis Foster, MD [place], PGY-3 [phone]

## 2021-09-19 DIAGNOSIS — I69322 Dysarthria following cerebral infarction: Secondary | ICD-10-CM | POA: Diagnosis not present

## 2021-09-19 DIAGNOSIS — I69354 Hemiplegia and hemiparesis following cerebral infarction affecting left non-dominant side: Secondary | ICD-10-CM | POA: Diagnosis not present

## 2021-09-19 DIAGNOSIS — E1122 Type 2 diabetes mellitus with diabetic chronic kidney disease: Secondary | ICD-10-CM | POA: Diagnosis not present

## 2021-09-19 DIAGNOSIS — I130 Hypertensive heart and chronic kidney disease with heart failure and stage 1 through stage 4 chronic kidney disease, or unspecified chronic kidney disease: Secondary | ICD-10-CM | POA: Diagnosis not present

## 2021-09-19 DIAGNOSIS — I5030 Unspecified diastolic (congestive) heart failure: Secondary | ICD-10-CM | POA: Diagnosis not present

## 2021-09-19 DIAGNOSIS — M170 Bilateral primary osteoarthritis of knee: Secondary | ICD-10-CM | POA: Diagnosis not present

## 2021-09-19 DIAGNOSIS — N183 Chronic kidney disease, stage 3 unspecified: Secondary | ICD-10-CM | POA: Diagnosis not present

## 2021-09-19 DIAGNOSIS — M160 Bilateral primary osteoarthritis of hip: Secondary | ICD-10-CM | POA: Diagnosis not present

## 2021-09-19 DIAGNOSIS — E1142 Type 2 diabetes mellitus with diabetic polyneuropathy: Secondary | ICD-10-CM | POA: Diagnosis not present

## 2021-09-19 NOTE — Telephone Encounter (Signed)
Patient is calling back again and would like for Dr. Alba Cory to call her back at the new number listed below. Patient would like for Dr. Rosita Fire Robinson's to call her back as soon as possible.

## 2021-09-19 NOTE — Telephone Encounter (Signed)
[phone]  Patient requesting to call the above listed number in order to cancel her student loans.  She states that these agency needs this provider's NPI number in order to approve cancellation of her student loans.

## 2021-09-20 DIAGNOSIS — E1142 Type 2 diabetes mellitus with diabetic polyneuropathy: Secondary | ICD-10-CM | POA: Diagnosis not present

## 2021-09-20 DIAGNOSIS — I5030 Unspecified diastolic (congestive) heart failure: Secondary | ICD-10-CM | POA: Diagnosis not present

## 2021-09-20 DIAGNOSIS — M160 Bilateral primary osteoarthritis of hip: Secondary | ICD-10-CM | POA: Diagnosis not present

## 2021-09-20 DIAGNOSIS — E1122 Type 2 diabetes mellitus with diabetic chronic kidney disease: Secondary | ICD-10-CM | POA: Diagnosis not present

## 2021-09-20 DIAGNOSIS — I130 Hypertensive heart and chronic kidney disease with heart failure and stage 1 through stage 4 chronic kidney disease, or unspecified chronic kidney disease: Secondary | ICD-10-CM | POA: Diagnosis not present

## 2021-09-20 DIAGNOSIS — I69322 Dysarthria following cerebral infarction: Secondary | ICD-10-CM | POA: Diagnosis not present

## 2021-09-20 DIAGNOSIS — N183 Chronic kidney disease, stage 3 unspecified: Secondary | ICD-10-CM | POA: Diagnosis not present

## 2021-09-20 DIAGNOSIS — I69354 Hemiplegia and hemiparesis following cerebral infarction affecting left non-dominant side: Secondary | ICD-10-CM | POA: Diagnosis not present

## 2021-09-20 DIAGNOSIS — M170 Bilateral primary osteoarthritis of knee: Secondary | ICD-10-CM | POA: Diagnosis not present

## 2021-09-21 ENCOUNTER — Telehealth: Payer: Self-pay | Admitting: *Deleted

## 2021-09-21 NOTE — Chronic Care Management (AMB) (Signed)
  Care Management   Note  09/21/2021 Name: [name] MRN: [id number] DOB: [birth year]  [name] is a 70 y.o. year old female who is a primary care patient of [name], [name], MD and is actively engaged with the care management team. I reached out to [name] by phone today to assist with scheduling an initial visit with the RN Case Manager  Follow up plan: Unsuccessful telephone outreach attempt made. A HIPAA compliant phone message was left for the patient providing contact information and requesting a return call.  The care management team will reach out to the patient again over the next 7 days.  If patient returns call to provider office, please advise to call [place] at [phone].  [place] Management  Direct Dial: [phone]

## 2021-09-22 ENCOUNTER — Telehealth: Payer: Self-pay | Admitting: Physical Medicine & Rehabilitation

## 2021-09-22 DIAGNOSIS — I5030 Unspecified diastolic (congestive) heart failure: Secondary | ICD-10-CM | POA: Diagnosis not present

## 2021-09-22 DIAGNOSIS — N183 Chronic kidney disease, stage 3 unspecified: Secondary | ICD-10-CM | POA: Diagnosis not present

## 2021-09-22 DIAGNOSIS — E1122 Type 2 diabetes mellitus with diabetic chronic kidney disease: Secondary | ICD-10-CM | POA: Diagnosis not present

## 2021-09-22 DIAGNOSIS — I69322 Dysarthria following cerebral infarction: Secondary | ICD-10-CM | POA: Diagnosis not present

## 2021-09-22 DIAGNOSIS — M160 Bilateral primary osteoarthritis of hip: Secondary | ICD-10-CM | POA: Diagnosis not present

## 2021-09-22 DIAGNOSIS — I69354 Hemiplegia and hemiparesis following cerebral infarction affecting left non-dominant side: Secondary | ICD-10-CM | POA: Diagnosis not present

## 2021-09-22 DIAGNOSIS — M170 Bilateral primary osteoarthritis of knee: Secondary | ICD-10-CM | POA: Diagnosis not present

## 2021-09-22 DIAGNOSIS — E1142 Type 2 diabetes mellitus with diabetic polyneuropathy: Secondary | ICD-10-CM | POA: Diagnosis not present

## 2021-09-22 DIAGNOSIS — I130 Hypertensive heart and chronic kidney disease with heart failure and stage 1 through stage 4 chronic kidney disease, or unspecified chronic kidney disease: Secondary | ICD-10-CM | POA: Diagnosis not present

## 2021-09-22 NOTE — Telephone Encounter (Signed)
Robin OT with [facility] wanted to let us know that patient had a fall off the edge of her bed hitting the floor.  No injury, fire dept was called out and did a check on patient.  Any questions, please call Robin at [phone].

## 2021-09-23 ENCOUNTER — Other Ambulatory Visit: Payer: Self-pay

## 2021-09-23 ENCOUNTER — Encounter: Payer: Self-pay | Admitting: Podiatry

## 2021-09-23 ENCOUNTER — Ambulatory Visit: Payer: Medicare PPO | Admitting: Podiatry

## 2021-09-23 DIAGNOSIS — K219 Gastro-esophageal reflux disease without esophagitis: Secondary | ICD-10-CM

## 2021-09-23 DIAGNOSIS — Z9181 History of falling: Secondary | ICD-10-CM

## 2021-09-23 DIAGNOSIS — G4733 Obstructive sleep apnea (adult) (pediatric): Secondary | ICD-10-CM

## 2021-09-23 DIAGNOSIS — I5030 Unspecified diastolic (congestive) heart failure: Secondary | ICD-10-CM | POA: Diagnosis not present

## 2021-09-23 DIAGNOSIS — I69354 Hemiplegia and hemiparesis following cerebral infarction affecting left non-dominant side: Secondary | ICD-10-CM | POA: Diagnosis not present

## 2021-09-23 DIAGNOSIS — E1142 Type 2 diabetes mellitus with diabetic polyneuropathy: Secondary | ICD-10-CM | POA: Diagnosis not present

## 2021-09-23 DIAGNOSIS — D689 Coagulation defect, unspecified: Secondary | ICD-10-CM

## 2021-09-23 DIAGNOSIS — M170 Bilateral primary osteoarthritis of knee: Secondary | ICD-10-CM | POA: Diagnosis not present

## 2021-09-23 DIAGNOSIS — I130 Hypertensive heart and chronic kidney disease with heart failure and stage 1 through stage 4 chronic kidney disease, or unspecified chronic kidney disease: Secondary | ICD-10-CM | POA: Diagnosis not present

## 2021-09-23 DIAGNOSIS — I700 Atherosclerosis of aorta: Secondary | ICD-10-CM

## 2021-09-23 DIAGNOSIS — D72819 Decreased white blood cell count, unspecified: Secondary | ICD-10-CM

## 2021-09-23 DIAGNOSIS — M79675 Pain in left toe(s): Secondary | ICD-10-CM

## 2021-09-23 DIAGNOSIS — M79674 Pain in right toe(s): Secondary | ICD-10-CM | POA: Diagnosis not present

## 2021-09-23 DIAGNOSIS — N183 Chronic kidney disease, stage 3 unspecified: Secondary | ICD-10-CM | POA: Diagnosis not present

## 2021-09-23 DIAGNOSIS — E1122 Type 2 diabetes mellitus with diabetic chronic kidney disease: Secondary | ICD-10-CM | POA: Diagnosis not present

## 2021-09-23 DIAGNOSIS — E1159 Type 2 diabetes mellitus with other circulatory complications: Secondary | ICD-10-CM

## 2021-09-23 DIAGNOSIS — B351 Tinea unguium: Secondary | ICD-10-CM | POA: Diagnosis not present

## 2021-09-23 DIAGNOSIS — Z6837 Body mass index (BMI) 37.0-37.9, adult: Secondary | ICD-10-CM

## 2021-09-23 DIAGNOSIS — E890 Postprocedural hypothyroidism: Secondary | ICD-10-CM

## 2021-09-23 DIAGNOSIS — D509 Iron deficiency anemia, unspecified: Secondary | ICD-10-CM

## 2021-09-23 DIAGNOSIS — E785 Hyperlipidemia, unspecified: Secondary | ICD-10-CM

## 2021-09-23 DIAGNOSIS — M160 Bilateral primary osteoarthritis of hip: Secondary | ICD-10-CM | POA: Diagnosis not present

## 2021-09-23 DIAGNOSIS — I4891 Unspecified atrial fibrillation: Secondary | ICD-10-CM

## 2021-09-23 DIAGNOSIS — I69322 Dysarthria following cerebral infarction: Secondary | ICD-10-CM | POA: Diagnosis not present

## 2021-09-23 NOTE — Progress Notes (Signed)
This patient returns to my office for at risk foot care.  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  This patient is unable to cut nails herself  since the patient cannot reach her  nails.These nails are painful walking and wearing shoes.  Patient presents in a wheelchair .  This patient presents for at risk foot care today.  General Appearance  Alert, conversant and in no acute stress.  Vascular  Dorsalis pedis and posterior tibial  pulses are weakly  palpable  bilaterally.  Capillary return is within normal limits  bilaterally. Temperature is within normal limits  bilaterally.  Neurologic  Senn-Weinstein monofilament wire diminished  bilaterally. Muscle power within normal limits bilaterally.  Nails Thick disfigured discolored nails with subungual debris  from hallux to fifth toes bilaterally. No evidence of bacterial infection or drainage bilaterally.  Orthopedic  No limitations of motion  feet .  No crepitus or effusions noted.  No bony pathology or digital deformities noted.  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  Skin  normotropic skin with no porokeratosis noted bilaterally.  No signs of infections or ulcers noted.     Onychomycosis  Pain in right toes  Pain in left toes  Consent was obtained for treatment procedures.   Mechanical debridement of nails 1-5  bilaterally performed with a nail nipper.  Filed with dremel without incident. No infection or ulcer.     Return office visit    3 months       Told patient to return for periodic foot care and evaluation due to potential at risk complications.   Gardiner Barefoot DPM

## 2021-09-27 ENCOUNTER — Telehealth: Payer: Self-pay | Admitting: *Deleted

## 2021-09-27 DIAGNOSIS — I69322 Dysarthria following cerebral infarction: Secondary | ICD-10-CM | POA: Diagnosis not present

## 2021-09-27 DIAGNOSIS — I5030 Unspecified diastolic (congestive) heart failure: Secondary | ICD-10-CM | POA: Diagnosis not present

## 2021-09-27 DIAGNOSIS — M160 Bilateral primary osteoarthritis of hip: Secondary | ICD-10-CM | POA: Diagnosis not present

## 2021-09-27 DIAGNOSIS — E1122 Type 2 diabetes mellitus with diabetic chronic kidney disease: Secondary | ICD-10-CM | POA: Diagnosis not present

## 2021-09-27 DIAGNOSIS — I130 Hypertensive heart and chronic kidney disease with heart failure and stage 1 through stage 4 chronic kidney disease, or unspecified chronic kidney disease: Secondary | ICD-10-CM | POA: Diagnosis not present

## 2021-09-27 DIAGNOSIS — N183 Chronic kidney disease, stage 3 unspecified: Secondary | ICD-10-CM | POA: Diagnosis not present

## 2021-09-27 DIAGNOSIS — E1142 Type 2 diabetes mellitus with diabetic polyneuropathy: Secondary | ICD-10-CM | POA: Diagnosis not present

## 2021-09-27 DIAGNOSIS — I69354 Hemiplegia and hemiparesis following cerebral infarction affecting left non-dominant side: Secondary | ICD-10-CM | POA: Diagnosis not present

## 2021-09-27 DIAGNOSIS — M170 Bilateral primary osteoarthritis of knee: Secondary | ICD-10-CM | POA: Diagnosis not present

## 2021-09-27 NOTE — Telephone Encounter (Signed)
Patient called to inquire about the status of her loan forgiveness application.  She said that her PCP is aware that she is unable to pay it back and was going to help her with this process.  Will forward to MD to check on the status.  Please give patient a call regarding this.  Lillian Tigges,CMA

## 2021-09-27 NOTE — Telephone Encounter (Signed)
Please call patient to pick up and complete the patient portion of the forms. Forms left in front reception area folder with patient's name on front.   Phone number provided by the patient is for patient services, unable to speak with representative for purposes of student loan forgiveness for provider purposes as requested. I have printed and completed the physician portion for the patient to pick up and complete the patient portions and have her mail the information to the correct address on the form.   Eulis Foster, MD [place], PGY-3 [phone]

## 2021-09-28 ENCOUNTER — Telehealth: Payer: Self-pay | Admitting: *Deleted

## 2021-09-28 NOTE — Telephone Encounter (Signed)
Patient returns call to nurse line. Patient informed she can come by and pick up completed provider portion, however she needs to complete her part. Patient stated, "they have all of the information they need, they just need my doctor to call and prove she is a doctor." Patient reports the license number was missing from form and they were unable to locate provider in system.   Patient advised the number given was not successful for provider. Patient became upset stating, "all I need is for someone to call them and give them my doctors license number." I asked patient for a number I could call, she continued to shout at me stating, "I don't have it anymore, the doctor has it."   Do you have the number? I am happy call them and give updated information on you.

## 2021-09-28 NOTE — Telephone Encounter (Signed)
Pt calls back upset and with the number.  After speaking with her for 15 minutes about our process and managing her expectations I was able to calm her down and get the information needed.  I called the number she gave me but I was unable to get any specific information because I "was not the account holder".  I was however able to get the general info we needed.   Dr. Alba Cory can't be the sole physician on the form, there needs to be an attending.   I had Dr. Erin Hearing sign the form below Dr. Alba Cory.   Called patient back and informed of the addition, but did advise that she would need to complete her portion before we could fax it.   She will have her niece [name] pick it up tomorrow and she will mail it after it is complete.   Copy made for batch scanning.   Christen Bame, CMA

## 2021-09-28 NOTE — Telephone Encounter (Signed)
Attempted to call patient to inform form is up front for her. However, no answer or option for VM. If she call back please give information below.

## 2021-09-28 NOTE — Telephone Encounter (Signed)
Patient is very confused by what meds she is supposed to be taking. Her specific concerns are as follows:  Protonix - she was on 1 tab daily, now it is 1 tablet BID Gabapentin - she was on 300mg  TID, but it was reduced to 100mg  TID  These were done at the hospital and she does not "recall anyone telling me"  Pt would like PCP to "help me understand".  I did advise that PCP is working in the hospital at the moment and would likely not be able to respond for a few days.  Patient declines appt with a different physician (virtual or in person) and only wants to have a virtual with PCP to discuss the meds.  *Of note, patient reports that it is very hard for her to get around since her 3 stroke and it would be "much easier for me if we could do a telephone visit to discuss these meds". Christen Bame, CMA

## 2021-09-28 NOTE — Progress Notes (Signed)
[facility] Telemedicine Visit  Patient consented to have virtual visit. Method of visit: Telephone  Encounter participants: Patient: [name] - located at [facility] ED  Provider: Eulis Foster - located at [facility]   Chief Complaint: Medication reconciliation  HPI:  Patient reports that she wanted to have this virtual visit in order to clarify her medications.  Patient states that the 100 mg of gabapentin has not been alleviating her symptoms and also she has increased to 300 mg.  Patient reports that this works well for her.  She is also been taking her Protonix twice daily and wanted to make sure this is correct.  Reviewed discharge summary from prior hospitalization for weakness and confirmed that this was appropriate.  Also patient states that she has not had her fluoxetine 20 and 10 mg prescriptions.  She normally takes 30 mg daily.  Patient states that she is also in the [facility], ED as she had weakness and could not walk nails worsened from her prior week Prior strokes.  ROS: per HPI  Pertinent PMHx:  CVA Type 1 diabetes  Exam:  Respiratory: Patient speaking in full sentences but with slurred speech  Assessment/Plan:  GERD (gastroesophageal reflux disease) Refill Protonix 40 mg twice daily  Diabetic peripheral neuropathy (HCC) Refill gabapentin 300 mg per patient request.  Weakness of left side of body Patient reports worsening weakness.  Patient currently in [facility], ED for evaluation.  Patient will need follow-up once discharged either from the ED or if admitted, discharge from hospital.    Time spent during visit with patient: 12 minutes

## 2021-09-29 ENCOUNTER — Other Ambulatory Visit: Payer: Self-pay | Admitting: Family Medicine

## 2021-09-29 NOTE — Chronic Care Management (AMB) (Signed)
  Care Management   Outreach Note  09/29/2021 Name: [name] MRN: [id number] DOB: 1951/05/12  Referred by: Eulis Foster, MD Reason for referral : Care Coordination (Outreach to schedule initial call with [facility])   A second unsuccessful telephone outreach was attempted today. The patient was referred to the case management team for assistance with care management and care coordination.   Follow Up Plan:  A HIPAA compliant phone message was left for the patient providing contact information and requesting a return call. The care management team will reach out to the patient again over the next 7 days. If patient returns call to provider office, please advise to call [place] at [phone].  [place] Management  Direct Dial: [phone]

## 2021-09-30 ENCOUNTER — Telehealth: Payer: Self-pay

## 2021-09-30 NOTE — Telephone Encounter (Signed)
Missed visit from OT today, patient preference.

## 2021-10-03 DIAGNOSIS — I5030 Unspecified diastolic (congestive) heart failure: Secondary | ICD-10-CM | POA: Diagnosis not present

## 2021-10-03 DIAGNOSIS — I69322 Dysarthria following cerebral infarction: Secondary | ICD-10-CM | POA: Diagnosis not present

## 2021-10-03 DIAGNOSIS — M160 Bilateral primary osteoarthritis of hip: Secondary | ICD-10-CM | POA: Diagnosis not present

## 2021-10-03 DIAGNOSIS — N183 Chronic kidney disease, stage 3 unspecified: Secondary | ICD-10-CM | POA: Diagnosis not present

## 2021-10-03 DIAGNOSIS — E1122 Type 2 diabetes mellitus with diabetic chronic kidney disease: Secondary | ICD-10-CM | POA: Diagnosis not present

## 2021-10-03 DIAGNOSIS — M170 Bilateral primary osteoarthritis of knee: Secondary | ICD-10-CM | POA: Diagnosis not present

## 2021-10-03 DIAGNOSIS — E1065 Type 1 diabetes mellitus with hyperglycemia: Secondary | ICD-10-CM | POA: Diagnosis not present

## 2021-10-03 DIAGNOSIS — E1142 Type 2 diabetes mellitus with diabetic polyneuropathy: Secondary | ICD-10-CM | POA: Diagnosis not present

## 2021-10-03 DIAGNOSIS — I130 Hypertensive heart and chronic kidney disease with heart failure and stage 1 through stage 4 chronic kidney disease, or unspecified chronic kidney disease: Secondary | ICD-10-CM | POA: Diagnosis not present

## 2021-10-03 DIAGNOSIS — I69354 Hemiplegia and hemiparesis following cerebral infarction affecting left non-dominant side: Secondary | ICD-10-CM | POA: Diagnosis not present

## 2021-10-04 ENCOUNTER — Telehealth: Payer: Self-pay | Admitting: Physical Medicine & Rehabilitation

## 2021-10-04 ENCOUNTER — Telehealth: Payer: Self-pay

## 2021-10-04 DIAGNOSIS — E1142 Type 2 diabetes mellitus with diabetic polyneuropathy: Secondary | ICD-10-CM | POA: Diagnosis not present

## 2021-10-04 DIAGNOSIS — M160 Bilateral primary osteoarthritis of hip: Secondary | ICD-10-CM | POA: Diagnosis not present

## 2021-10-04 DIAGNOSIS — M170 Bilateral primary osteoarthritis of knee: Secondary | ICD-10-CM | POA: Diagnosis not present

## 2021-10-04 DIAGNOSIS — E1122 Type 2 diabetes mellitus with diabetic chronic kidney disease: Secondary | ICD-10-CM | POA: Diagnosis not present

## 2021-10-04 DIAGNOSIS — I5030 Unspecified diastolic (congestive) heart failure: Secondary | ICD-10-CM | POA: Diagnosis not present

## 2021-10-04 DIAGNOSIS — I69322 Dysarthria following cerebral infarction: Secondary | ICD-10-CM | POA: Diagnosis not present

## 2021-10-04 DIAGNOSIS — N183 Chronic kidney disease, stage 3 unspecified: Secondary | ICD-10-CM | POA: Diagnosis not present

## 2021-10-04 DIAGNOSIS — I130 Hypertensive heart and chronic kidney disease with heart failure and stage 1 through stage 4 chronic kidney disease, or unspecified chronic kidney disease: Secondary | ICD-10-CM | POA: Diagnosis not present

## 2021-10-04 DIAGNOSIS — I69354 Hemiplegia and hemiparesis following cerebral infarction affecting left non-dominant side: Secondary | ICD-10-CM | POA: Diagnosis not present

## 2021-10-04 MED ORDER — OMEGA-3-ACID ETHYL ESTERS 1 G PO CAPS: 1.0000 g | ORAL_CAPSULE | Freq: Every day | ORAL | 0 refills | Status: DC

## 2021-10-04 NOTE — Telephone Encounter (Signed)
Sharyn Lull OT with [facility] will be discharging patient from OT, she has reached her goals.  Any questions, please call her at [phone].

## 2021-10-04 NOTE — Telephone Encounter (Signed)
Refill request for Omega 3. Refill sent

## 2021-10-05 ENCOUNTER — Telehealth: Payer: Self-pay

## 2021-10-05 MED ORDER — GLIPIZIDE 5 MG PO TABS
2.5000 mg | ORAL_TABLET | Freq: Every day | ORAL | 0 refills | Status: DC
Start: 2021-10-05 — End: 2021-10-10

## 2021-10-05 NOTE — Telephone Encounter (Signed)
Refill request for Glipizide 5 mg. One time courtesy refill. Future refill request needs to go to the PCP

## 2021-10-06 ENCOUNTER — Telehealth: Payer: Self-pay

## 2021-10-06 ENCOUNTER — Ambulatory Visit (INDEPENDENT_AMBULATORY_CARE_PROVIDER_SITE_OTHER): Payer: Medicare PPO

## 2021-10-06 DIAGNOSIS — I442 Atrioventricular block, complete: Secondary | ICD-10-CM

## 2021-10-06 LAB — CUP PACEART REMOTE DEVICE CHECK
Battery Remaining Longevity: 123 mo
Battery Voltage: 3.04 V
Brady Statistic AP VP Percent: 1.38 %
Brady Statistic AP VS Percent: 19.32 %
Brady Statistic AS VP Percent: 35.35 %
Brady Statistic AS VS Percent: 43.91 %
Brady Statistic RA Percent Paced: 15.9 %
Brady Statistic RV Percent Paced: 31.69 %
Date Time Interrogation Session: 20221013061305
Implantable Lead Implant Date: 20210702
Implantable Lead Implant Date: 20210702
Implantable Lead Location: 753859
Implantable Lead Location: 753860
Implantable Lead Model: 5076
Implantable Lead Model: 5076
Implantable Pulse Generator Implant Date: 20210702
Lead Channel Impedance Value: 266 Ohm
Lead Channel Impedance Value: 323 Ohm
Lead Channel Impedance Value: 380 Ohm
Lead Channel Impedance Value: 437 Ohm
Lead Channel Pacing Threshold Amplitude: 0.5 V
Lead Channel Pacing Threshold Amplitude: 0.5 V
Lead Channel Pacing Threshold Pulse Width: 0.4 ms
Lead Channel Pacing Threshold Pulse Width: 0.4 ms
Lead Channel Sensing Intrinsic Amplitude: 3.875 mV
Lead Channel Sensing Intrinsic Amplitude: 3.875 mV
Lead Channel Sensing Intrinsic Amplitude: 8.25 mV
Lead Channel Sensing Intrinsic Amplitude: 8.25 mV
Lead Channel Setting Pacing Amplitude: 1.5 V
Lead Channel Setting Pacing Amplitude: 2.5 V
Lead Channel Setting Pacing Pulse Width: 0.4 ms
Lead Channel Setting Sensing Sensitivity: 1.2 mV

## 2021-10-06 NOTE — Telephone Encounter (Signed)
Scheduled remote reviewed. Normal device function.   There were three atrial fib arrhythmias detected, one was greater than 99 hours, AF burden is 28.8% of the time, ? [place], sent to triage.  Pt history of AF, states she has been on Eliquis for 2 years now and is concerned that it is not working since she recently had another stroke. Record review shows hospitalization 08/04/21.    Pt reports that she currently is experiencing SOB even at rest.  She denies any swelling in LE, although reports she wears compression stockings.  Pt stated SOB and cough started about 2 weeks ago.  Pt does not sound to be in distress today during phone call.    Pt meds include Torsemide 10mg  daily.    Educated pt on ED precautions.  Pt agreeable to follow-up MD appt to address recent increase in AF.

## 2021-10-06 NOTE — Chronic Care Management (AMB) (Signed)
  Care Management   Note  10/06/2021 Name: [name] MRN: [id number] DOB: [birth year]  [name] is a 70 y.o. year old female who is a primary care patient of [name], [name], MD and is actively engaged with the care management team. I reached out to [name] by phone today to assist with scheduling an initial visit with the RN Case Manager  Follow up plan: Telephone appointment with care management team member scheduled for:10/14/21  [place] Management  Direct Dial: [phone]

## 2021-10-07 ENCOUNTER — Other Ambulatory Visit: Payer: Self-pay | Admitting: Family Medicine

## 2021-10-10 ENCOUNTER — Emergency Department (HOSPITAL_COMMUNITY): Payer: Medicare PPO

## 2021-10-10 ENCOUNTER — Other Ambulatory Visit: Payer: Self-pay

## 2021-10-10 ENCOUNTER — Encounter: Payer: Self-pay | Admitting: Family Medicine

## 2021-10-10 ENCOUNTER — Telehealth (INDEPENDENT_AMBULATORY_CARE_PROVIDER_SITE_OTHER): Payer: Medicare PPO | Admitting: Family Medicine

## 2021-10-10 ENCOUNTER — Emergency Department (HOSPITAL_COMMUNITY)
Admission: EM | Admit: 2021-10-10 | Discharge: 2021-10-13 | Disposition: A | Discharge status: Home / Self Care | Payer: Medicare PPO | Attending: Emergency Medicine | Admitting: Emergency Medicine

## 2021-10-10 DIAGNOSIS — R0902 Hypoxemia: Secondary | ICD-10-CM | POA: Diagnosis not present

## 2021-10-10 DIAGNOSIS — I5032 Chronic diastolic (congestive) heart failure: Secondary | ICD-10-CM | POA: Insufficient documentation

## 2021-10-10 DIAGNOSIS — Y92002 Bathroom of unspecified non-institutional (private) residence single-family (private) house as the place of occurrence of the external cause: Secondary | ICD-10-CM | POA: Insufficient documentation

## 2021-10-10 DIAGNOSIS — Y9301 Activity, walking, marching and hiking: Secondary | ICD-10-CM | POA: Insufficient documentation

## 2021-10-10 DIAGNOSIS — Z7901 Long term (current) use of anticoagulants: Secondary | ICD-10-CM | POA: Insufficient documentation

## 2021-10-10 DIAGNOSIS — R0602 Shortness of breath: Secondary | ICD-10-CM | POA: Diagnosis not present

## 2021-10-10 DIAGNOSIS — Z20822 Contact with and (suspected) exposure to covid-19: Secondary | ICD-10-CM | POA: Insufficient documentation

## 2021-10-10 DIAGNOSIS — E119 Type 2 diabetes mellitus without complications: Secondary | ICD-10-CM | POA: Insufficient documentation

## 2021-10-10 DIAGNOSIS — M6281 Muscle weakness (generalized): Secondary | ICD-10-CM | POA: Insufficient documentation

## 2021-10-10 DIAGNOSIS — N390 Urinary tract infection, site not specified: Secondary | ICD-10-CM | POA: Diagnosis not present

## 2021-10-10 DIAGNOSIS — Z794 Long term (current) use of insulin: Secondary | ICD-10-CM | POA: Insufficient documentation

## 2021-10-10 DIAGNOSIS — Z85850 Personal history of malignant neoplasm of thyroid: Secondary | ICD-10-CM | POA: Insufficient documentation

## 2021-10-10 DIAGNOSIS — R531 Weakness: Secondary | ICD-10-CM | POA: Diagnosis not present

## 2021-10-10 DIAGNOSIS — G319 Degenerative disease of nervous system, unspecified: Secondary | ICD-10-CM | POA: Diagnosis not present

## 2021-10-10 DIAGNOSIS — W19XXXA Unspecified fall, initial encounter: Secondary | ICD-10-CM | POA: Diagnosis not present

## 2021-10-10 DIAGNOSIS — I110 Hypertensive heart disease with heart failure: Secondary | ICD-10-CM | POA: Diagnosis not present

## 2021-10-10 DIAGNOSIS — R739 Hyperglycemia, unspecified: Secondary | ICD-10-CM | POA: Diagnosis not present

## 2021-10-10 DIAGNOSIS — R2681 Unsteadiness on feet: Secondary | ICD-10-CM | POA: Insufficient documentation

## 2021-10-10 DIAGNOSIS — M25562 Pain in left knee: Secondary | ICD-10-CM | POA: Diagnosis not present

## 2021-10-10 DIAGNOSIS — W050XXA Fall from non-moving wheelchair, initial encounter: Secondary | ICD-10-CM | POA: Insufficient documentation

## 2021-10-10 DIAGNOSIS — Z79899 Other long term (current) drug therapy: Secondary | ICD-10-CM | POA: Diagnosis not present

## 2021-10-10 DIAGNOSIS — E039 Hypothyroidism, unspecified: Secondary | ICD-10-CM | POA: Diagnosis not present

## 2021-10-10 DIAGNOSIS — R29818 Other symptoms and signs involving the nervous system: Secondary | ICD-10-CM | POA: Diagnosis not present

## 2021-10-10 DIAGNOSIS — K219 Gastro-esophageal reflux disease without esophagitis: Secondary | ICD-10-CM | POA: Diagnosis not present

## 2021-10-10 DIAGNOSIS — E1142 Type 2 diabetes mellitus with diabetic polyneuropathy: Secondary | ICD-10-CM | POA: Diagnosis not present

## 2021-10-10 DIAGNOSIS — Z9181 History of falling: Secondary | ICD-10-CM | POA: Diagnosis not present

## 2021-10-10 DIAGNOSIS — S8992XA Unspecified injury of left lower leg, initial encounter: Secondary | ICD-10-CM | POA: Insufficient documentation

## 2021-10-10 DIAGNOSIS — R519 Headache, unspecified: Secondary | ICD-10-CM | POA: Diagnosis not present

## 2021-10-10 DIAGNOSIS — Z91040 Latex allergy status: Secondary | ICD-10-CM | POA: Insufficient documentation

## 2021-10-10 LAB — CBC
HCT: 36.6 % (ref 36.0–46.0)
Hemoglobin: 11.2 g/dL — ABNORMAL LOW (ref 12.0–15.0)
MCH: 28.3 pg (ref 26.0–34.0)
MCHC: 30.6 g/dL (ref 30.0–36.0)
MCV: 92.4 fL (ref 80.0–100.0)
Platelets: 242 10*3/uL (ref 150–400)
RBC: 3.96 MIL/uL (ref 3.87–5.11)
RDW: 14.2 % (ref 11.5–15.5)
WBC: 5.2 10*3/uL (ref 4.0–10.5)
nRBC: 0 % (ref 0.0–0.2)

## 2021-10-10 LAB — BASIC METABOLIC PANEL
Anion gap: 8 (ref 5–15)
BUN: 10 mg/dL (ref 8–23)
CO2: 24 mmol/L (ref 22–32)
Calcium: 8.6 mg/dL — ABNORMAL LOW (ref 8.9–10.3)
Chloride: 103 mmol/L (ref 98–111)
Creatinine, Ser: 1.13 mg/dL — ABNORMAL HIGH (ref 0.44–1.00)
GFR, Estimated: 52 mL/min — ABNORMAL LOW (ref >60)
Glucose, Bld: 234 mg/dL — ABNORMAL HIGH (ref 70–99)
Potassium: 4 mmol/L (ref 3.5–5.1)
Sodium: 135 mmol/L (ref 135–145)

## 2021-10-10 LAB — URINALYSIS, ROUTINE W REFLEX MICROSCOPIC
Bilirubin Urine: NEGATIVE
Glucose, UA: NEGATIVE mg/dL
Hgb urine dipstick: NEGATIVE
Ketones, ur: NEGATIVE mg/dL
Nitrite: NEGATIVE
Protein, ur: 30 mg/dL — AB
Specific Gravity, Urine: 1.015 (ref 1.005–1.030)
WBC, UA: >50 WBC/hpf — ABNORMAL HIGH (ref 0–5)
pH: 7 (ref 5.0–8.0)

## 2021-10-10 LAB — TROPONIN I (HIGH SENSITIVITY)
Troponin I (High Sensitivity): 38 ng/L — ABNORMAL HIGH (ref <18)
Troponin I (High Sensitivity): 31 ng/L — ABNORMAL HIGH (ref <18)

## 2021-10-10 LAB — CBG MONITORING, ED
Glucose-Capillary: 202 mg/dL — ABNORMAL HIGH (ref 70–99)
Glucose-Capillary: 167 mg/dL — ABNORMAL HIGH (ref 70–99)

## 2021-10-10 LAB — RESP PANEL BY RT-PCR (FLU A&B, COVID) ARPGX2
Influenza A by PCR: NEGATIVE
Influenza B by PCR: NEGATIVE
SARS Coronavirus 2 by RT PCR: NEGATIVE

## 2021-10-10 LAB — BRAIN NATRIURETIC PEPTIDE: B Natriuretic Peptide: 240.2 pg/mL — ABNORMAL HIGH (ref 0.0–100.0)

## 2021-10-10 MED ORDER — PANTOPRAZOLE SODIUM 40 MG PO TBEC
40.0000 mg | DELAYED_RELEASE_TABLET | Freq: Two times a day (BID) | ORAL | Status: DC
  Administered 2021-10-11 – 2021-10-12 (×4): 40 mg via ORAL
  Filled 2021-10-10 (×4): qty 1

## 2021-10-10 MED ORDER — AMLODIPINE BESYLATE 5 MG PO TABS
10.0000 mg | ORAL_TABLET | Freq: Every day | ORAL | Status: DC
  Administered 2021-10-10 – 2021-10-12 (×3): 10 mg via ORAL
  Filled 2021-10-10 (×3): qty 2

## 2021-10-10 MED ORDER — GABAPENTIN 300 MG PO CAPS: 300.0000 mg | ORAL_CAPSULE | Freq: Three times a day (TID) | ORAL | 3 refills | Status: DC

## 2021-10-10 MED ORDER — LEVOTHYROXINE SODIUM 75 MCG PO TABS: 175.0000 ug | ORAL_TABLET | Freq: Every day | ORAL | Status: DC

## 2021-10-10 MED ORDER — CEPHALEXIN 250 MG PO CAPS: 250.0000 mg | ORAL_CAPSULE | Freq: Four times a day (QID) | ORAL | 0 refills | Status: DC

## 2021-10-10 MED ORDER — ACETAMINOPHEN 325 MG PO TABS: 650.0000 mg | ORAL_TABLET | Freq: Four times a day (QID) | ORAL | Status: DC | PRN

## 2021-10-10 MED ORDER — LEVOTHYROXINE SODIUM 75 MCG PO TABS
175.0000 ug | ORAL_TABLET | Freq: Every day | ORAL | Status: DC
  Administered 2021-10-11 – 2021-10-12 (×2): 175 ug via ORAL
  Filled 2021-10-10 (×2): qty 1

## 2021-10-10 MED ORDER — TORSEMIDE 20 MG PO TABS
10.0000 mg | ORAL_TABLET | Freq: Every day | ORAL | Status: DC
  Administered 2021-10-10 – 2021-10-12 (×3): 10 mg via ORAL
  Filled 2021-10-10 (×3): qty 1

## 2021-10-10 MED ORDER — TRAMADOL HCL 50 MG PO TABS
50.0000 mg | ORAL_TABLET | Freq: Two times a day (BID) | ORAL | Status: DC | PRN
Start: 2021-10-10 — End: 2021-10-13
  Administered 2021-10-10: 50 mg via ORAL
  Filled 2021-10-10: qty 1

## 2021-10-10 MED ORDER — ATORVASTATIN CALCIUM 80 MG PO TABS
80.0000 mg | ORAL_TABLET | Freq: Every evening | ORAL | Status: DC
  Administered 2021-10-10 – 2021-10-12 (×3): 80 mg via ORAL
  Filled 2021-10-10: qty 1
  Filled 2021-10-10: qty 2
  Filled 2021-10-10: qty 1

## 2021-10-10 MED ORDER — FLUOXETINE HCL 20 MG PO CAPS
30.0000 mg | ORAL_CAPSULE | Freq: Every day | ORAL | Status: DC
  Administered 2021-10-10: 30 mg via ORAL
  Filled 2021-10-10 (×3): qty 1

## 2021-10-10 MED ORDER — FLUOXETINE HCL 10 MG PO CAPS: 30.0000 mg | ORAL_CAPSULE | Freq: Every day | ORAL | 1 refills | Status: DC

## 2021-10-10 MED ORDER — GABAPENTIN 300 MG PO CAPS
300.0000 mg | ORAL_CAPSULE | Freq: Three times a day (TID) | ORAL | Status: DC
  Administered 2021-10-10 – 2021-10-12 (×6): 300 mg via ORAL
  Filled 2021-10-10 (×6): qty 1

## 2021-10-10 MED ORDER — FLUOXETINE HCL 20 MG PO TABS: 20.0000 mg | ORAL_TABLET | Freq: Every day | ORAL | 1 refills | Status: DC

## 2021-10-10 MED ORDER — CEPHALEXIN 250 MG PO CAPS
250.0000 mg | ORAL_CAPSULE | Freq: Four times a day (QID) | ORAL | Status: DC
  Administered 2021-10-10 – 2021-10-13 (×9): 250 mg via ORAL
  Filled 2021-10-10 (×9): qty 1

## 2021-10-10 MED ORDER — PANTOPRAZOLE SODIUM 40 MG PO TBEC: 40.0000 mg | DELAYED_RELEASE_TABLET | Freq: Two times a day (BID) | ORAL | 2 refills | Status: DC

## 2021-10-10 NOTE — ED Notes (Signed)
Patient requesting to go home. Explained plan and answered all questions. Patient remains adamant that she wants to leave.

## 2021-10-10 NOTE — ED Provider Notes (Signed)
Signout from Dr. Eulis Foster.  70 year old female here with fall and left knee pain.  Patient is baseline mostly in her wheelchair.  Golden Circle while trying to get to the bathroom.  Imaging and lab work unremarkable.  Does have signs of a urinary tract infection.  Patient's plan was to be discharged but she felt her symptoms were to great and needed to stay in the hospital.  She was placed under provider Default and home meds have been ordered.  She does need antibiotics for UTI.  She is awaiting PT consult Physical Exam  BP (!) 146/97   Pulse 62   Temp 99.3 F (37.4 C) (Oral)   Resp 20   SpO2 100%   Physical Exam  ED Course/Procedures     Procedures  MDM  I was now informed by the nurse that the patient does not want to stay here overnight and wishes to be discharged.  Staff attempted to get patient into wheelchair for discharge.  She said she cannot walk and now wishes to stay for further evaluation.  We will continue with current plan set forth by Dr. Addison Lank, Rebeca Alert, MD 10/10/21 [id number]

## 2021-10-10 NOTE — ED Triage Notes (Signed)
EMS stated, pt. Golden Circle after trying to come out of bathroom . EMS came and pick her up and back to bed. Didn't attempt to get out of bed yesterday and today when she tried to get up today she could not. Pt not complaining. She has left sided weakness from a previous stroke. SOB on movement.  Pt on 6 L O2  Rhonchi sound lower lobes. Sleep with Bi-Papap

## 2021-10-10 NOTE — ED Notes (Signed)
Patient yelling in hallway asking to leave. Patient states, "When I go home and fall, I'm going to sue all of you." This RN explained that we were trying to address the patient's concerns and had a CT and PT eval ordered. Pt expressed understanding of plan. No further questions at this time. Patient provided with meal and drink.

## 2021-10-10 NOTE — Discharge Instructions (Addendum)
We started [name] on Keflex for a UTI on 10/10/21.  She will need to complete 5 days total, until 10/15/21, of this antibiotic.  It has been prescribed to the pharmacy.   Your insulin pump has been discontinued and we have started you on SQ insulin.   She has chronic left sided weakness and some mild dysarthria from a prior stroke.  She has returned to our ER with recurring falls at home and concern that she needs more continuous care and physical rehab.    Her CT scan of the brain in the ER on this visit did NOT show signs of new stroke or traumatic injury.  Her xray of the left knee does not show sign of fracture or dislocation.  We are recommending continued physical therapy for her left sided weakness.  She can follow up with a primary care provider or facility provider for her other chronic medical conditions.  She should continue all of her "Home" medications listed in the AVS, in addition to the new prescriptions started in the ED for Keflex 250 mg QID for 3 days, Prozac 20 mg daily, Gabapentin 300 mg TID, and protonix 40 mg daily.

## 2021-10-10 NOTE — Assessment & Plan Note (Signed)
Refill Protonix 40 mg twice daily

## 2021-10-10 NOTE — Assessment & Plan Note (Signed)
Patient reports worsening weakness.  Patient currently in [facility], ED for evaluation.  Patient will need follow-up once discharged either from the ED or if admitted, discharge from hospital.

## 2021-10-10 NOTE — Assessment & Plan Note (Signed)
Refill gabapentin 300 mg per patient request.

## 2021-10-10 NOTE — ED Notes (Addendum)
Patient dressed and attempted to get in wheelchair. Patient continually reiterating that she is unable to walk. This RN and EMT assisted with dressing and standing. Again explained to patient that we are trying to help patient with PT. Pt agreeable to stay and be evaluated.

## 2021-10-10 NOTE — ED Provider Notes (Signed)
[facility] EMERGENCY DEPARTMENT Provider Note   CSN: [id number] Arrival date & time: 10/10/21  2585     History Chief Complaint  Patient presents with   Weakness   Fall    [name] is a 70 y.o. female.  HPI She presents today after fall in bathroom, yesterday.  EMS came to her home and put her back in bed.  She was unable to go to bed afterwards.  She states that she typically uses a wheelchair to get around but when she goes to the bathroom she has to stand up and walk from the doorway to the commode.  She states that yesterday her feet got "tangled up," causing her to fall injuring her left knee.  Initially she was okay but then noticed increasing pain that made it hard to move the left knee when she awoke this morning in bed.  She denies recent vomiting, fever, chills, cough or shortness of breath.  She is taking her usual medications.  There are no other known active modifying factors.    Past Medical History:  Diagnosis Date   Abnormal echocardiogram    Anemia    Arthritis    BPPV (benign paroxysmal positional vertigo), right 06/13/2017   Bradycardia 03/07/2020   Cerebrovascular accident (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 ([place]) 10/29/2015   CHB (complete heart block) ([place]) 03/08/2020   CHF (congestive heart failure) ([place])    Closed fracture of left proximal humerus 07/08/2019   Decreased range of motion of left shoulder 09/03/2020   Depression    Dizzy 03/07/2020   bradycardia   Edentulous 09/03/2020   GERD (gastroesophageal reflux disease)    Hemiparesis affecting left side as late effect of cerebrovascular accident (CVA) ([place]) 03/08/2020   Hemorrhoids    History of Falls with injury 03/08/2020   Fall resulting in left humeral fracture   Hyperlipidemia    Hypertension    Hypothyroidism    Intracranial vascular stenosis    Lacunar infarction ([place])    Brain MRI 07/2021 multiple small remote pontine lacunar infarcts    Near syncope 03/08/2020   Paroxysmal atrial fibrillation (HCC)    Presence of permanent cardiac pacemaker    Proteinuria 03/08/2020   Seasonal allergies    Stroke ([place])    Thyroid cancer ([place])    per pt S/p Total Thyroidectomy with Radioactive Iodine Therapy   Type 1 diabetes mellitus ([place])     Patient Active Problem List   Diagnosis Date Noted   Leukopenia    Diabetic peripheral neuropathy ([place])    Essential hypertension    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   Chronic diastolic congestive heart failure ([place])    Dyspnea    Weakness    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   BMI 40.0-44.9, adult ([place]) 08/06/2021   Weakness of left side of body 08/04/2021   Wound of left foot 07/26/2021   Unable to perform bed mobility without assistance 07/26/2021   Pressure ulcer 06/29/2021   Stiffness due to immobility 05/18/2021   Healthcare maintenance 02/17/2021   Mild cognitive impairment 09/03/2020   Presbyopia 09/03/2020   Counseling regarding advanced directives 09/03/2020   Constipation 09/03/2020   Imbalance 09/03/2020   Decreased range of motion of left shoulder 09/03/2020   Chronic lower back pain 07/27/2020   S/P placement of cardiac pacemaker 06/25/20  06/26/2020   Chronic DOAC therapy 03/08/2020   Hemiparesis and numbness affecting left side as late effect of cerebrovascular accident (  CVA) ([place]) 03/08/2020   History of Falls with injury 03/08/2020   Hypoalbuminemia 03/08/2020   Secondary hypercoagulable state ([place]) 11/12/2019   Dependent for wheelchair mobility 08/05/2019   OSA (obstructive sleep apnea) 05/27/2019   Paroxysmal atrial fibrillation ([place]) 05/27/2019   Insulin pump in place    (HFpEF) heart failure with preserved ejection fraction ([place]) 03/17/2019   Mood disorder ([place]) 10/29/2018   Hypertension associated with diabetes ([place]) 10/29/2015   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 ([place])    Lower extremity edema  08/20/2013   Morbid obesity ([place]) 05/29/2013   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 ([place]) 11/91/4782   Hypothyroidism, 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   GERD (gastroesophageal reflux disease) 01/25/2012    Past Surgical History:  Procedure Laterality Date   ABDOMINAL ANGIOGRAM N/A 05/14/2012   Procedure: ABDOMINAL ANGIOGRAM;  Surgeon: Laverda Page, MD;  Location: [facility] CATH LAB;  Service: Cardiovascular;  Laterality: N/A;   ABDOMINAL HYSTERECTOMY  1995   partial   ANKLE FRACTURE SURGERY Right    CARDIAC CATHETERIZATION     with coronary angiogram   cataract  Bilateral 2018   COLONOSCOPY N/A 09/09/2014   Procedure: COLONOSCOPY;  Surgeon: Gatha Mayer, MD;  Location: [place];  Service: Endoscopy;  Laterality: N/A;   ESOPHAGOGASTRODUODENOSCOPY (EGD) WITH PROPOFOL N/A 12/03/2018   Procedure: ESOPHAGOGASTRODUODENOSCOPY (EGD) WITH PROPOFOL;  Surgeon: Doran Stabler, MD;  Location: [place];  Service: Gastroenterology;  Laterality: N/A;   LEFT AND RIGHT HEART CATHETERIZATION WITH CORONARY ANGIOGRAM N/A 05/14/2012   Procedure: LEFT AND RIGHT HEART CATHETERIZATION WITH CORONARY ANGIOGRAM;  Surgeon: Laverda Page, MD;  Location: [facility] CATH LAB;  Service: Cardiovascular;  Laterality: N/A;   LOOP RECORDER INSERTION N/A 12/23/2018   Procedure: LOOP RECORDER INSERTION;  Surgeon: Constance Haw, MD;  Location: [place] CV LAB;  Service: Cardiovascular;  Laterality: N/A;   LOOP RECORDER REMOVAL N/A 06/25/2020   Procedure: LOOP RECORDER REMOVAL;  Surgeon: Constance Haw, MD;  Location: [place] CV LAB;  Service: Cardiovascular;  Laterality: N/A;   MINI METER   09/08/2014   ELECTRONIC INSULIN PUMP   PACEMAKER IMPLANT N/A 06/25/2020   Procedure: PACEMAKER IMPLANT;  Surgeon: Constance Haw, MD;  Location: [place] CV LAB;  Service: Cardiovascular;  Laterality: N/A;   TEE WITHOUT CARDIOVERSION N/A 10/16/2018   Procedure: TRANSESOPHAGEAL ECHOCARDIOGRAM (TEE);   Surgeon: Buford Dresser, MD;  Location: [facility] ENDOSCOPY;  Service: Cardiovascular;  Laterality: N/A;   THYROIDECTOMY  2006     OB History   No obstetric history on file.     Family History  Problem Relation Age of Onset   Heart disease Mother    Hyperlipidemia Mother    Hypertension Mother    Renal Disease Mother        insufficiency   Heart attack Mother        in his 66's   Early death Father        Head Injury at Work   Hyperlipidemia Father    Hypertension Father    Stroke Son    Diabetes Maternal Aunt    Prostate cancer Paternal Uncle    Heart disease Maternal Aunt    Heart failure Maternal Grandfather    Heart disease Maternal Grandfather    Heart attack Maternal Grandfather        Died in his 74's   Breast cancer Neg Hx     Social History   Tobacco Use   Smoking status:  Never   Smokeless tobacco: Never  Vaping Use   Vaping Use: Never used  Substance Use Topics   Alcohol use: No   Drug use: No    Home Medications Prior to Admission medications   Medication Sig Start Date End Date Taking? Authorizing Provider  acetaminophen (TYLENOL) 325 MG tablet Take 2 tablets (650 mg total) by mouth every 6 (six) hours as needed for moderate pain. 08/30/21   Angiulli, Lavon Paganini, PA-C  amLODipine (NORVASC) 10 MG tablet Take 1 tablet (10 mg total) by mouth daily. 08/30/21 02/26/22  Angiulli, Lavon Paganini, PA-C  apixaban (ELIQUIS) 5 MG TABS tablet Take 1 tablet (5 mg total) by mouth 2 (two) times daily. 08/30/21   Angiulli, Lavon Paganini, PA-C  atorvastatin (LIPITOR) 80 MG tablet Take 1 tablet (80 mg total) by mouth every evening. 08/30/21   Angiulli, Lavon Paganini, PA-C  fexofenadine (ALLEGRA) 180 MG tablet Take 180 mg by mouth daily as needed for allergies or rhinitis.    [provider]  FLUoxetine (PROZAC) 10 MG capsule Take 3 capsules (30 mg total) by mouth daily. 10/10/21   [name], Makiera, MD  FLUoxetine (PROZAC) 20 MG tablet Take 1 tablet (20 mg total) by mouth daily.  10/10/21   [name], Makiera, MD  gabapentin (NEURONTIN) 300 MG capsule Take 1 capsule (300 mg total) by mouth 3 (three) times daily. 10/10/21   [name], [name], MD  Insulin Human (INSULIN PUMP) SOLN Inject 1 each into the skin 3 times daily with meals, bedtime and 2 AM. 08/10/21   Sharion Settler, DO  levothyroxine (SYNTHROID) 175 MCG tablet Take 1 tablet (175 mcg total) by mouth daily before breakfast. 08/30/21   Angiulli, Lavon Paganini, PA-C  Multiple Vitamins-Minerals (OCUVITE PO) Take 1 tablet by mouth daily.    [provider]  omega-3 acid ethyl esters (LOVAZA) 1 g capsule Take 1 capsule (1 g total) by mouth daily. 10/04/21   Kirsteins, Luanna Salk, MD  pantoprazole (PROTONIX) 40 MG tablet Take 1 tablet (40 mg total) by mouth 2 (two) times daily. 10/10/21   [name], Makiera, MD  potassium chloride (KLOR-CON) 10 MEQ tablet Take 1 tablet (10 mEq total) by mouth daily. 08/30/21   Angiulli, Lavon Paganini, PA-C  torsemide (DEMADEX) 10 MG tablet Take 1 tablet (10 mg total) by mouth daily. 08/30/21   Angiulli, Lavon Paganini, PA-C  traMADol (ULTRAM) 50 MG tablet Take 1 tablet (50 mg total) by mouth every 12 (twelve) hours as needed for severe pain. 08/30/21   Angiulli, Lavon Paganini, PA-C    Allergies    Latex  Review of Systems   Review of Systems  All other systems reviewed and are negative.  Physical Exam Updated Vital Signs BP (!) 139/99   Pulse 100   Temp 99.3 F (37.4 C) (Oral)   Resp 19   SpO2 100%   Physical Exam Vitals and nursing note reviewed.  Constitutional:      General: She is not in acute distress.    Appearance: She is well-developed. She is obese. She is not ill-appearing, toxic-appearing or diaphoretic.  HENT:     Head: Normocephalic and atraumatic.     Right Ear: External ear normal.     Left Ear: External ear normal.  Eyes:     Conjunctiva/sclera: Conjunctivae normal.     Pupils: Pupils are equal, round, and reactive to light.  Neck:     Trachea:  Phonation normal.  Cardiovascular:     Rate and Rhythm: Normal rate.  Pulmonary:  Effort: Pulmonary effort is normal.  Chest:     Chest wall: No tenderness.  Abdominal:     General: There is no distension.     Palpations: Abdomen is soft.     Tenderness: There is no abdominal tenderness.  Musculoskeletal:        General: Normal range of motion.     Cervical back: Normal range of motion and neck supple.     Comments: Mild left knee tenderness without deformity.  She guards against moving the left knee both actively and passively however with passive range of motion I am able to flex the knee to about 80 degrees without significant pain or discomfort.  I am also able to move the left ankle and left hip without significant pain.  Skin:    General: Skin is warm and dry.  Neurological:     Mental Status: She is alert and oriented to person, place, and time.     Cranial Nerves: No cranial nerve deficit.     Sensory: No sensory deficit.     Motor: No abnormal muscle tone.     Coordination: Coordination normal.  Psychiatric:        Mood and Affect: Mood normal.        Behavior: Behavior normal.        Thought Content: Thought content normal.        Judgment: Judgment normal.    ED Results / Procedures / Treatments   Labs (all labs ordered are listed, but only abnormal results are displayed) Labs Reviewed  BASIC METABOLIC PANEL - Abnormal; Notable for the following components:      Result Value   Glucose, Bld 234 (*)    Creatinine, Ser 1.13 (*)    Calcium 8.6 (*)    GFR, Estimated 52 (*)    All other components within normal limits  CBC - Abnormal; Notable for the following components:   Hemoglobin 11.2 (*)    All other components within normal limits  URINALYSIS, ROUTINE W REFLEX MICROSCOPIC - Abnormal; Notable for the following components:   Color, Urine AMBER (*)    APPearance CLOUDY (*)    Protein, ur 30 (*)    Leukocytes,Ua LARGE (*)    WBC, UA >50 (*)    Bacteria, UA  MANY (*)    All other components within normal limits  BRAIN NATRIURETIC PEPTIDE - Abnormal; Notable for the following components:   B Natriuretic Peptide 240.2 (*)    All other components within normal limits  CBG MONITORING, ED - Abnormal; Notable for the following components:   Glucose-Capillary 202 (*)    All other components within normal limits  CBG MONITORING, ED - Abnormal; Notable for the following components:   Glucose-Capillary 167 (*)    All other components within normal limits  TROPONIN I (HIGH SENSITIVITY) - Abnormal; Notable for the following components:   Troponin I (High Sensitivity) 38 (*)    All other components within normal limits  TROPONIN I (HIGH SENSITIVITY) - Abnormal; Notable for the following components:   Troponin I (High Sensitivity) 31 (*)    All other components within normal limits  RESP PANEL BY RT-PCR (FLU A&B, COVID) [id number]  URINE CULTURE    EKG EKG Interpretation  Date/Time:  Monday October 10 2021 08:34:23 EDT Ventricular Rate:  62 PR Interval:  272 QRS Duration: 102 QT Interval:  474 QTC Calculation: 481 R Axis:   -2 Text Interpretation: Sinus rhythm with 1st degree A-V block with frequent  ventricular-paced complexes Nonspecific T wave abnormality Prolonged QT Abnormal ECG Since last tracing rate slower and qt is shorter also intermittent AV pacing is suspected Confirmed by Daleen Bo [phone]) on 10/10/2021 3:15:04 PM  Radiology DG Chest 2 View  Result Date: 10/10/2021 CLINICAL DATA:  Shortness of breath, fall after trying to come out of the bathroom EXAM: CHEST - 2 VIEW COMPARISON:  Chest radiograph 08/05/2021 FINDINGS: A left chest wall cardiac device is stable. A defibrillator pad overlies the chest. The heart is enlarged, unchanged. There is vascular congestion and possible mild pulmonary interstitial edema. There is no focal consolidation. There is no significant pleural effusion. There is no pneumothorax. There is no acute osseous  abnormality. IMPRESSION: Mild cardiomegaly with vascular congestion and possible mild pulmonary interstitial edema. Electronically Signed   By: Valetta Mole M.D.   On: 10/10/2021 13:40   DG Knee 2 Views Left  Result Date: 10/10/2021 CLINICAL DATA:  Fall, pain EXAM: LEFT KNEE - 1-2 VIEW COMPARISON:  Left knee radiographs 07/06/2019 FINDINGS: There is no acute fracture or dislocation. Knee alignment is normal. There is mild medial and lateral tibiofemoral joint space narrowing with minimal associated osteophytosis. There is no effusion. The soft tissues are unremarkable. IMPRESSION: No acute fracture or dislocation. Electronically Signed   By: Valetta Mole M.D.   On: 10/10/2021 16:21    Procedures Procedures   Medications Ordered in ED Medications  acetaminophen (TYLENOL) tablet 650 mg (has no administration in time range)  amLODipine (NORVASC) tablet 10 mg (has no administration in time range)  atorvastatin (LIPITOR) tablet 80 mg (has no administration in time range)  FLUoxetine (PROZAC) capsule 30 mg (has no administration in time range)  gabapentin (NEURONTIN) capsule 300 mg (has no administration in time range)  pantoprazole (PROTONIX) EC tablet 40 mg (has no administration in time range)  torsemide (DEMADEX) tablet 10 mg (has no administration in time range)  traMADol (ULTRAM) tablet 50 mg (has no administration in time range)  levothyroxine (SYNTHROID) tablet 175 mcg (has no administration in time range)  cephALEXin (KEFLEX) capsule 250 mg (has no administration in time range)    ED Course  I have reviewed the triage vital signs and the nursing notes.  Pertinent labs & imaging results that were available during my care of the patient were reviewed by me and considered in my medical decision making (see chart for details).    MDM Rules/Calculators/A&P                            Patient Vitals for the past 24 hrs:  BP Temp Temp src Pulse Resp SpO2  10/10/21 1530 (!) 139/99 --  -- 100 19 100 %  10/10/21 1500 (!) 162/69 -- -- 60 (!) 24 99 %  10/10/21 1231 (!) 141/59 -- -- 61 18 100 %  10/10/21 1125 (!) 156/53 -- -- (!) 28 18 96 %  10/10/21 0831 (!) 141/55 99.3 F (37.4 C) Oral (!) 59 18 100 %    5:50 PM Reevaluation with update and discussion. After initial assessment and treatment, an updated evaluation reveals no further complaints.  Findings discussed and questions answered. Daleen Bo   Medical Decision Making:  This patient is presenting for evaluation of fall while walking from wheelchair to bathroom, which does require a range of treatment options, and is a complaint that involves a moderate risk of morbidity and mortality. The differential diagnoses include contusion, fracture, nonspecific illness. I decided  to review old records, and in summary elderly female, coming from home, where she fell, after getting out of her wheelchair.  She has a history of GERD, insulin-dependent diabetes, paroxysmal atrial fibrillation, hemiparesis left after CVA, mild cognitive impairment.  I did not require additional historical information from anyone.  Clinical Laboratory Tests Ordered, included viral respiratory panel, CBC, be met, BMP, troponin, repeat troponin, urinalysis, urine culture. Review indicates normal except urinalysis abnormal, glucose high, creatinine high, calcium low, GFR low, hemoglobin low, delta troponin high but stable.  BNP high. Radiologic Tests Ordered, included x-ray left knee, chest x-ray.  I independently Visualized: Radiograph images, which show no acute abnormality    Critical Interventions-clinical evaluation, laboratory testing, radiography, observation, reassessment.  Added on urine culture.  After These Interventions, the Patient was reevaluated and was found with persistent difficulty walking, by her report.  She does have history of left-sided weakness and likely explains at least part of the left knee difficulty with examination and  ambulation.  There is no apparent fracture.  Patient does not report urinary symptoms however has an abnormal urinalysis and since she has mild cognitive impairment I elected to treat her with Keflex for possible UTI and send urine culture.  Patient will be evaluated by PT prior to discharge for consideration of further treatments as needed.  She requested a head CT however I doubt that she has an acute CVA.  CRITICAL CARE-no Performed by: Daleen Bo  Nursing Notes Reviewed/ Care Coordinated Applicable Imaging Reviewed Interpretation of Laboratory Data incorporated into ED treatment  7:15 PM-after initial discharge patient refused to leave stating that she needed further testing and treatment.  Asked for PT consultation which will have to be tomorrow.  Ordered head CT to help appease patient.  Ordered home medicines and started treatment for possible UTI.  Patient may boarder status for further evaluation and treatment.    Final Clinical Impression(s) / ED Diagnoses Final diagnoses:  Fall, initial encounter  Injury of left knee, initial encounter  Urinary tract infection without hematuria, site unspecified    Rx / DC Orders ED Discharge Orders     None        Daleen Bo, MD 10/10/21 [phone]

## 2021-10-10 NOTE — ED Notes (Addendum)
Upon explaining discharge instructions to patient, pt expressed concerns that she is unable to work and would like to speak with the MD about a possible stroke workup. Eulis Foster, MD made aware.

## 2021-10-11 LAB — BASIC METABOLIC PANEL
Anion gap: 8 (ref 5–15)
BUN: 10 mg/dL (ref 8–23)
CO2: 27 mmol/L (ref 22–32)
Calcium: 8.4 mg/dL — ABNORMAL LOW (ref 8.9–10.3)
Chloride: 102 mmol/L (ref 98–111)
Creatinine, Ser: 1.17 mg/dL — ABNORMAL HIGH (ref 0.44–1.00)
GFR, Estimated: 50 mL/min — ABNORMAL LOW (ref >60)
Glucose, Bld: 203 mg/dL — ABNORMAL HIGH (ref 70–99)
Potassium: 3.6 mmol/L (ref 3.5–5.1)
Sodium: 137 mmol/L (ref 135–145)

## 2021-10-11 LAB — BRAIN NATRIURETIC PEPTIDE: B Natriuretic Peptide: 201.5 pg/mL — ABNORMAL HIGH (ref 0.0–100.0)

## 2021-10-11 LAB — CBG MONITORING, ED
Glucose-Capillary: 248 mg/dL — ABNORMAL HIGH (ref 70–99)
Glucose-Capillary: 195 mg/dL — ABNORMAL HIGH (ref 70–99)

## 2021-10-11 MED ORDER — FLUOXETINE HCL 10 MG PO CAPS
30.0000 mg | ORAL_CAPSULE | Freq: Every day | ORAL | Status: DC
  Administered 2021-10-11 – 2021-10-12 (×2): 30 mg via ORAL
  Filled 2021-10-11 (×4): qty 3

## 2021-10-11 MED ORDER — INSULIN PUMP
Freq: Three times a day (TID) | SUBCUTANEOUS | Status: DC
  Administered 2021-10-12 (×3): 1.1 via SUBCUTANEOUS
  Filled 2021-10-11: qty 1

## 2021-10-11 NOTE — ED Notes (Signed)
Breakfast Orders placed 

## 2021-10-11 NOTE — Discharge Planning (Signed)
Licensed Clinical Social Worker is seeking post-discharge placement for this patient at the following level of care: skilled nursing facility    

## 2021-10-11 NOTE — ED Notes (Signed)
Pt resting and does not want vitals taken at this time

## 2021-10-11 NOTE — NC FL2 (Signed)
[place] LEVEL OF CARE SCREENING TOOL     IDENTIFICATION  Patient Name: [name] Birthdate: 02-03-1951 Sex: female Admission Date (Current Location): 10/10/2021  [facility] and [place] Number:  Herbalist and Address:  [facility], [place] [address], [place], [place] [address]      Provider Number: [id number]  Attending Physician Name and Address:  Default, Provider, MD  Relative Name and Phone Number:  Adrian Blackwater, [phone]    Current Level of Care: SNF Recommended Level of Care: [place] Prior Approval Number:    Date Approved/Denied:   PASRR Number: [id number]  Discharge Plan: SNF    Current Diagnoses: Patient Active Problem List   Diagnosis Date Noted   Leukopenia    Diabetic peripheral neuropathy [facility])    Essential hypertension    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[place]) 08/10/2021   Chronic diastolic congestive heart failure ([place])    Dyspnea    Weakness    Left pontine stroke ([place]) 08/06/2021   BMI 40.0-44.9, adult ([place]) 08/06/2021   Weakness of left side of body 08/04/2021   Wound of left foot 07/26/2021   Unable to perform bed mobility without assistance 07/26/2021   Pressure ulcer 06/29/2021   Stiffness due to immobility 05/18/2021   Healthcare maintenance 02/17/2021   Mild cognitive impairment 09/03/2020   Presbyopia 09/03/2020   Counseling regarding advanced directives 09/03/2020   Constipation 09/03/2020   Imbalance 09/03/2020   Decreased range of motion of left shoulder 09/03/2020   Chronic lower back pain 07/27/2020   S/P placement of cardiac pacemaker 06/25/20  06/26/2020   Chronic DOAC therapy 03/08/2020   Hemiparesis and numbness affecting left side as late effect of cerebrovascular accident (CVA) ([place]) 03/08/2020   History of Falls with injury 03/08/2020   Hypoalbuminemia 03/08/2020   Secondary hypercoagulable state ([place]) 11/12/2019   Dependent for  wheelchair mobility 08/05/2019   OSA (obstructive sleep apnea) 05/27/2019   Paroxysmal atrial fibrillation ([place]) 05/27/2019   Insulin pump in place    (HFpEF) heart failure with preserved ejection fraction ([place]) 03/17/2019   Mood disorder ([place]) 10/29/2018   Hypertension associated with diabetes ([place]) 10/29/2015   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 ([place])    Lower extremity edema 08/20/2013   Morbid obesity ([place]) 05/29/2013   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 ([place]) 40/07/6760   Hypothyroidism, 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   GERD (gastroesophageal reflux disease) 01/25/2012    Orientation RESPIRATION BLADDER Height & Weight     Self, Time, Situation, Place  Normal Incontinent Weight:   Height:     BEHAVIORAL SYMPTOMS/MOOD NEUROLOGICAL BOWEL NUTRITION STATUS      Continent Diet (Patient is a diabetic)  AMBULATORY STATUS COMMUNICATION OF NEEDS Skin   Extensive Assist Verbally Normal                       Personal Care Assistance Level of Assistance  Bathing, Feeding, Dressing Bathing Assistance: Maximum assistance Feeding assistance: Independent Dressing Assistance: Maximum assistance     Functional Limitations Info  Sight, Speech, Hearing Sight Info: Adequate Hearing Info: Adequate Speech Info: Adequate    SPECIAL CARE FACTORS FREQUENCY                       Contractures Contractures Info: Not present    Additional Factors Info  Code Status, Allergies Code Status Info: Full Allergies Info: Latex  Current Medications (10/11/2021):  This is the current hospital active medication list Current Facility-Administered Medications  Medication Dose Route Frequency Provider Last Rate Last Admin   acetaminophen (TYLENOL) tablet 650 mg  650 mg Oral Q6H PRN Daleen Bo, MD       amLODipine (NORVASC) tablet 10 mg  10 mg Oral Daily Daleen Bo, MD   10 mg at 10/11/21 1007   atorvastatin (LIPITOR)  tablet 80 mg  80 mg Oral QPM Daleen Bo, MD   80 mg at 10/10/21 2121   cephALEXin (KEFLEX) capsule 250 mg  250 mg Oral Q6H Daleen Bo, MD   250 mg at 10/11/21 1245   FLUoxetine (PROZAC) capsule 30 mg  30 mg Oral Daily Daleen Bo, MD   30 mg at 10/11/21 1006   gabapentin (NEURONTIN) capsule 300 mg  300 mg Oral TID Daleen Bo, MD   300 mg at 10/11/21 1007   levothyroxine (SYNTHROID) tablet 175 mcg  175 mcg Oral Q0600 Daleen Bo, MD   175 mcg at 10/11/21 0528   pantoprazole (PROTONIX) EC tablet 40 mg  40 mg Oral BID Daleen Bo, MD   40 mg at 10/11/21 1006   torsemide (DEMADEX) tablet 10 mg  10 mg Oral Daily Daleen Bo, MD   10 mg at 10/11/21 1006   traMADol (ULTRAM) tablet 50 mg  50 mg Oral Q12H PRN Daleen Bo, MD   50 mg at 10/10/21 2120   Current Outpatient Medications  Medication Sig Dispense Refill   cephALEXin (KEFLEX) 250 MG capsule Take 1 capsule (250 mg total) by mouth 4 (four) times daily. 28 capsule 0   acetaminophen (TYLENOL) 325 MG tablet Take 2 tablets (650 mg total) by mouth every 6 (six) hours as needed for moderate pain.     amLODipine (NORVASC) 10 MG tablet Take 1 tablet (10 mg total) by mouth daily. 90 tablet 1   apixaban (ELIQUIS) 5 MG TABS tablet Take 1 tablet (5 mg total) by mouth 2 (two) times daily. 60 tablet 9   atorvastatin (LIPITOR) 80 MG tablet Take 1 tablet (80 mg total) by mouth every evening. 90 tablet 2   fexofenadine (ALLEGRA) 180 MG tablet Take 180 mg by mouth daily as needed for allergies or rhinitis.     FLUoxetine (PROZAC) 10 MG capsule Take 3 capsules (30 mg total) by mouth daily. 90 capsule 1   FLUoxetine (PROZAC) 20 MG tablet Take 1 tablet (20 mg total) by mouth daily. 90 tablet 1   gabapentin (NEURONTIN) 300 MG capsule Take 1 capsule (300 mg total) by mouth 3 (three) times daily. 90 capsule 3   Insulin Human (INSULIN PUMP) SOLN Inject 1 each into the skin 3 times daily with meals, bedtime and 2 AM.     levothyroxine (SYNTHROID)  175 MCG tablet Take 1 tablet (175 mcg total) by mouth daily before breakfast. 90 tablet 1   Multiple Vitamins-Minerals (OCUVITE PO) Take 1 tablet by mouth daily.     omega-3 acid ethyl esters (LOVAZA) 1 g capsule Take 1 capsule (1 g total) by mouth daily. 30 capsule 0   pantoprazole (PROTONIX) 40 MG tablet Take 1 tablet (40 mg total) by mouth 2 (two) times daily. 180 tablet 2   potassium chloride (KLOR-CON) 10 MEQ tablet Take 1 tablet (10 mEq total) by mouth daily. 90 tablet 1   torsemide (DEMADEX) 10 MG tablet Take 1 tablet (10 mg total) by mouth daily. 30 tablet 0   traMADol (ULTRAM) 50 MG tablet Take 1 tablet (  50 mg total) by mouth every 12 (twelve) hours as needed for severe pain. 30 tablet 0     Discharge Medications: Please see discharge summary for a list of discharge medications.  Relevant Imaging Results:  Relevant Lab Results:   Additional Information Insulin pump 3 times daily with meals,  SSN [id number]. Patient recevied one covid shot  [employer], LCSWA

## 2021-10-11 NOTE — Progress Notes (Addendum)
Inpatient Diabetes Program Recommendations  AACE/ADA: New Consensus Statement on Inpatient Glycemic Control (2015)  Target Ranges:  Prepandial:   less than 140 mg/dL      Peak postprandial:   less than 180 mg/dL (1-2 hours)      Critically ill patients:  140 - 180 mg/dL   Lab Results  Component Value Date   GLUCAP 248 (H) 10/11/2021   HGBA1C 10.3 (H) 08/04/2021    Review of Glycemic Control Results for Vanorder, [name] "ROSETTA" (MRN [id number]) as of 10/11/2021 15:12  Ref. Range 10/10/2021 15:30 10/10/2021 18:38 10/11/2021 10:31  Glucose-Capillary Latest Ref Range: 70 - 99 mg/dL 202 (H) 167 (H) 248 (H)   Diabetes history: DM 1 Outpatient Diabetes medications:  See's [name]: Settings on pump: 12 a-3a-0.5 units/hr 3a-9a 0.725 units/hr 9a-12n  1.3 units/hr 12n-1:30p 0.95 units/hr 1:30p-3p 1.2 units/hr 3p-6 p 1.10 units/hr 6p-7:30p 1.15 units/hr 7:30-12 a 0.95 units/hr Total basal = 22.275 units daily Carb Coverage 0000-1000       1:4       1 unit for every 4 grams of carbohydrates 1001-2130       1:4      1 unit for every 4 grams of carbohydrates 2131-0000       1:6       1 unit for every 6 grams of carbohydrates Insulin Sensitivity 0000-0500       1:60     1 unit drops blood glucose 60 mg/dl 0000-0500       1:50     1 unit drops blood glucose 50 mg/dl 0000-0500       1:60     1 unit drops blood glucose 60 mg/dl Target Glucose Goals 0000-0000       140-140 mg/dl  Current orders for Inpatient glycemic control:  None Inpatient Diabetes Program Recommendations:    Please add orders for insulin pump order set. Verified insulin pump settings with patient at bedside.  She is also wearing Dexcom sensor. She states that blood sugar is 169 mg/dL and that she often gets shaky with blood sugar of 150 mg/dL She states that site is due to be changed today and that her niece is coming to help her change it. Will follow.   Thanks , Adah Perl, RN, BC-ADM Inpatient  Diabetes Coordinator Pager [phone]  (8a-5p)

## 2021-10-11 NOTE — TOC Initial Note (Signed)
Transition of Care [facility]) - Initial/Assessment Note    Patient Details  Name: [name] MRN: [id number] Date of Birth: 01-20-1951  Transition of [facility]) CM/SW Contact:    Raina Mina, [place] Phone Number: 10/11/2021, 2:21 PM  Clinical Narrative:  Patient agreeable to participate in skilled nursing for rehab. Patient stated she lives with her sister and adopted daughter. Patient gave verbal consent for CSW to speak with her sister [name]. CSW explained to patient that if her insurance or facilities will not agree to take her for SNF she would have to discharge home. CSW explained she could pay out of pocket. Patient stated she would not be able to do that. CSW stated she will send off referrals and let her know if anyone accepts her. Patient also is not fully vaccinated. CSW told patient this could also cause an issue with a facility willing to accept her.                  Expected Discharge Plan: Skilled Nursing Facility Barriers to Discharge: Continued Medical Work up   Patient Goals and CMS Choice Patient states their goals for this hospitalization and ongoing recovery are:: To go home      Expected Discharge Plan and Services Expected Discharge Plan: [place] arrangements for the past 2 months: Single Family Home                   DME Agency: [place]                  Prior Living Arrangements/Services Living arrangements for the past 2 months: Single Family Home Lives with:: Adult Children, Siblings Patient language and need for interpreter reviewed:: Yes Do you feel safe going back to the place where you live?: Yes      Need for Family Participation in Patient Care: Yes (Comment) Care giver support system in place?: Yes (comment) Current home services: DME, Home PT, Home OT Criminal Activity/Legal Involvement Pertinent to Current Situation/Hospitalization: No - Comment as needed  Activities of  Daily Living      Permission Sought/Granted Permission sought to share information with : Family Supports Permission granted to share information with : Yes, Verbal Permission Granted  Share Information with NAME: Adrian Blackwater     Permission granted to share info w Relationship: Sister  Permission granted to share info w Contact Information: [phone]  Emotional Assessment Appearance:: Appears stated age Attitude/Demeanor/Rapport: Engaged Affect (typically observed): Accepting Orientation: : Oriented to Self, Oriented to Place, Oriented to  Time, Oriented to Situation Alcohol / Substance Use: Not Applicable Psych Involvement: No (comment)  Admission diagnosis:  weakness Patient Active Problem List   Diagnosis Date Noted   Leukopenia    Diabetic peripheral neuropathy ([place])    Essential hypertension    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[place]) 08/10/2021   Chronic diastolic congestive heart failure ([place])    Dyspnea    Weakness    Left pontine stroke ([place]) 08/06/2021   BMI 40.0-44.9, adult ([place]) 08/06/2021   Weakness of left side of body 08/04/2021   Wound of left foot 07/26/2021   Unable to perform bed mobility without assistance 07/26/2021   Pressure ulcer 06/29/2021   Stiffness due to immobility 05/18/2021   Healthcare maintenance 02/17/2021   Mild cognitive impairment 09/03/2020   Presbyopia 09/03/2020   Counseling regarding advanced directives 09/03/2020   Constipation 09/03/2020   Imbalance 09/03/2020  Decreased range of motion of left shoulder 09/03/2020   Chronic lower back pain 07/27/2020   S/P placement of cardiac pacemaker 06/25/20  06/26/2020   Chronic DOAC therapy 03/08/2020   Hemiparesis and numbness affecting left side as late effect of cerebrovascular accident (CVA) ([place]) 03/08/2020   History of Falls with injury 03/08/2020   Hypoalbuminemia 03/08/2020   Secondary hypercoagulable state ([place]) 11/12/2019   Dependent for wheelchair mobility  08/05/2019   OSA (obstructive sleep apnea) 05/27/2019   Paroxysmal atrial fibrillation ([place]) 05/27/2019   Insulin pump in place    (HFpEF) heart failure with preserved ejection fraction ([place]) 03/17/2019   Mood disorder ([place]) 10/29/2018   Hypertension associated with diabetes ([place]) 10/29/2015   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 ([place])    Lower extremity edema 08/20/2013   Morbid obesity ([place]) 05/29/2013   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 ([place]) 22/29/7989   Hypothyroidism, 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   GERD (gastroesophageal reflux disease) 01/25/2012   PCP:  Eulis Foster, MD Pharmacy:   [facility] [place], [place] AT [address] [place] [address] Phone: [phone] Fax: [phone]  [id number] - [place], [place] - [address] AT [place] [address] Phone: [phone] Fax: [phone]     Social Determinants of Health (SDOH) Interventions    Readmission Risk Interventions Readmission Risk Prevention Plan 04/17/2019  Transportation Screening Complete  PCP or Specialist Appt within 3-5 Days Complete  HRI or Home Care Consult Complete  Social Work Consult for [place] Planning/Counseling Complete  Palliative Care Screening Not Applicable  Medication Review Press photographer) Complete  Some recent data might be hidden

## 2021-10-11 NOTE — ED Notes (Signed)
PT at bedside.

## 2021-10-11 NOTE — ED Notes (Signed)
Hospital bed ordered for pt.

## 2021-10-11 NOTE — Evaluation (Signed)
Physical Therapy Evaluation Patient Details Name: [name] MRN: [id number] DOB: [birth year] Today's Date: 10/11/2021  History of Present Illness  70yo female who presented on 10/17 after a fall in her bathroom at home. Admitted for w/u. PMH BPPV, bradycardia, CVA, CHB s/p PPM placement, CHF, humerus fx, HLD, HTN, Afib, DM, ankle fx  Clinical Impression   Patient received in bed, pleasant and cooperative but concerned about her mobility. Very concerned about her L LE weakness (residual from prior CVA). Required heavy levels of physical assist for all mobility tasks and unable to maintain static standing balance even with BUE support. Really, she is quite a high fall risk when left to her own devices and support at home is limited as her sister has suffered a broken arm. Of note, did desat to as low as 81% on RA with activity but able to recover to 90-91% with PLB/rest on RA. Needed totalA for repositioning on stretcher, left positioned to comfort with all needs met and RN aware of pt status. Would benefit from rehab in SNF setting prior to return home.        Recommendations for follow up therapy are one component of a multi-disciplinary discharge planning process, led by the attending physician.  Recommendations may be updated based on patient status, additional functional criteria and insurance authorization.  Follow Up Recommendations SNF;Supervision for mobility/OOB    Equipment Recommendations  Other (comment) (TBD with progress)    Recommendations for Other Services       Precautions / Restrictions Precautions Precautions: Fall;Other (comment) Precaution Comments: hx of CVA with L residual weakness, insulin pump Restrictions Weight Bearing Restrictions: No      Mobility  Bed Mobility Overal bed mobility: Needs Assistance Bed Mobility: Supine to Sit;Sit to Supine     Supine to sit: Max assist Sit to supine: Mod assist   General bed mobility comments: MaxA  to manage BLEs and get them off of edge of ED stretcher as well as to scoot forward to square up at edge of stretcher; [place] for BLE management to return to stretcher and Max cues for trunk managment    Transfers Overall transfer level: Needs assistance Equipment used: Rolling walker (2 wheeled) Transfers: Sit to/from Stand Sit to Stand: Max assist;From elevated surface         General transfer comment: MaxA and rocking to stand up from stretcher, also heavy cues for hand palcement and sequencing. Up to [place] to maintain balance  Ambulation/Gait             General Gait Details: unable  Stairs            Wheelchair Mobility    Modified Rankin (Stroke Patients Only)       Balance Overall balance assessment: Needs assistance;History of Falls Sitting-balance support: Bilateral upper extremity supported;Feet unsupported Sitting balance-Leahy Scale: Fair Sitting balance - Comments: reliant on BUE support   Standing balance support: Bilateral upper extremity supported;During functional activity Standing balance-Leahy Scale: Poor Standing balance comment: posterior lean, unable to maintain even static balance without at least ModA                             Pertinent Vitals/Pain Pain Assessment: No/denies pain    Home Living Family/patient expects to be discharged to:: Private residence Living Arrangements: Other relatives;Children (sister and adopted daughter who is 71) Available Help at Discharge: Family;Available PRN/intermittently Type of Home: House Home Access: Ramped entrance  Home Layout: Two level;Able to live on main level with bedroom/bathroom Home Equipment: Wheelchair - power;Grab bars - toilet;Walker - 4 wheels;Wheelchair - Insurance risk surveyor Comments: sister broke her arm recently and cannot really assist    Prior Function Level of Independence: Needs assistance   Gait / Transfers Assistance Needed: Short  distances with platform rollator, stand pivots  ADL's / Homemaking Assistance Needed: Granddaughter assists with setup for sponge bath, shoes and socks, and assists with her back.  Family assists with meds, meals, home management and community mobility  Comments: bought power w/c off [place] for community mobility (lightweight), uses power w/c from [employer] in the home b/c it is heavier, primarily ambulates ~10steps using platform rollator to/from bathroom due to frequent falls with impaired balance     Hand Dominance   Dominant Hand: Right    Extremity/Trunk Assessment   Upper Extremity Assessment Upper Extremity Assessment: Defer to OT evaluation    Lower Extremity Assessment Lower Extremity Assessment: Generalized weakness    Cervical / Trunk Assessment Cervical / Trunk Assessment: Kyphotic  Communication   Communication: Expressive difficulties  Cognition Arousal/Alertness: Awake/alert Behavior During Therapy: WFL for tasks assessed/performed;Flat affect Overall Cognitive Status: Impaired/Different from baseline Area of Impairment: Safety/judgement;Awareness;Problem solving                         Safety/Judgement: Decreased awareness of safety;Decreased awareness of deficits Awareness: Intellectual Problem Solving: Slow processing;Requires verbal cues;Difficulty sequencing General Comments: needed multimodal cues for all functional problem solving and sequencing; often states "i can't" without trying      General Comments General comments (skin integrity, edema, etc.): SPO2 desat to 81% with activity on room air, able to recover to 90% with extra time and PLB    Exercises     Assessment/Plan    PT Assessment Patient needs continued PT services  PT Problem List Decreased strength;Decreased knowledge of use of DME;Obesity;Decreased activity tolerance;Decreased safety awareness;Decreased balance;Decreased mobility       PT Treatment Interventions DME  instruction;Balance training;Gait training;Functional mobility training;Patient/family education;Therapeutic activities;Wheelchair mobility training;Therapeutic exercise    PT Goals (Current goals can be found in the Care Plan section)  Acute Rehab PT Goals Patient Stated Goal: get left leg better PT Goal Formulation: With patient Time For Goal Achievement: 10/25/21 Potential to Achieve Goals: Fair    Frequency Min 3X/week   Barriers to discharge Decreased caregiver support sister has a broken arm/cannot provide physical support    Co-evaluation               AM-PAC PT "6 Clicks" Mobility  Outcome Measure Help needed turning from your back to your side while in a flat bed without using bedrails?: A Lot Help needed moving from lying on your back to sitting on the side of a flat bed without using bedrails?: A Lot Help needed moving to and from a bed to a chair (including a wheelchair)?: Total Help needed standing up from a chair using your arms (e.g., wheelchair or bedside chair)?: A Lot Help needed to walk in hospital room?: Total Help needed climbing 3-5 steps with a railing? : Total 6 Click Score: 9    End of Session Equipment Utilized During Treatment: Gait belt Activity Tolerance: Patient limited by fatigue Patient left: in bed;with call bell/phone within reach (ED stretcher) Nurse Communication: Mobility status PT Visit Diagnosis: Unsteadiness on feet (R26.81);Difficulty in walking, not elsewhere classified (R26.2);Hemiplegia and hemiparesis;Muscle weakness (generalized) (M62.81);History of falling (Z91.81) Hemiplegia -  Right/Left: Left Hemiplegia - dominant/non-dominant: Non-dominant Hemiplegia - caused by: Cerebral infarction    Time: 0488-8916 PT Time Calculation (min) (ACUTE ONLY): 31 min   Charges:   PT Evaluation $PT Eval Moderate Complexity: 1 Mod PT Treatments $Therapeutic Activity: 8-22 mins       Windell Norfolk, DPT, PN2   Supplemental Physical  Therapist [place]    Pager [phone] Acute Rehab Office [phone]

## 2021-10-11 NOTE — ED Provider Notes (Signed)
Emergency Medicine Observation Re-evaluation Note  [name] [name] is a 70 y.o. female, seen on rounds today.  Pt initially presented to the ED for complaints of Weakness and Fall Currently, the patient is resting in bed.  Physical Exam  BP 136/65   Pulse 60   Temp 99 F (37.2 C) (Oral)   Resp 20   SpO2 90%  Physical Exam General: Nontoxic appearance Cardiac: Normal heart rate Lungs: Normal respiratory rate Psych: No internal responsiveness  ED Course / MDM  EKG:EKG Interpretation  Date/Time:  Monday October 10 2021 08:34:23 EDT Ventricular Rate:  62 PR Interval:  272 QRS Duration: 102 QT Interval:  474 QTC Calculation: 481 R Axis:   -2 Text Interpretation: Sinus rhythm with 1st degree A-V block with frequent ventricular-paced complexes Nonspecific T wave abnormality Prolonged QT Abnormal ECG Since last tracing rate slower and qt is shorter also intermittent AV pacing is suspected Confirmed by Daleen Bo [phone]) on 10/10/2021 3:15:04 PM  I have reviewed the labs performed to date as well as medications administered while in observation.  Recent changes in the last 24 hours include she has been seen by PT who note that she desaturates while walking, and that it recovers to 90 percent with rest.  Patient does not use oxygen chronically.  Last cardiac echo done August 2022 had EF greater than 55%.  She has a history of obstructive sleep apnea.  BNP was mildly elevated yesterday.  Chest x-ray showed vascular congestion.  She will likely benefit from CPAP at night.  BNP improved to over 1.5 today. Plan  Current plan is for placement.  FL 2 signed  [name] is not under involuntary commitment.     Daleen Bo, MD 10/11/21 [phone]

## 2021-10-12 LAB — CBG MONITORING, ED
Glucose-Capillary: 200 mg/dL — ABNORMAL HIGH (ref 70–99)
Glucose-Capillary: 186 mg/dL — ABNORMAL HIGH (ref 70–99)
Glucose-Capillary: 130 mg/dL — ABNORMAL HIGH (ref 70–99)

## 2021-10-12 LAB — URINE CULTURE: Culture: 60000 — AB

## 2021-10-12 MED ORDER — SENNOSIDES-DOCUSATE SODIUM 8.6-50 MG PO TABS
1.0000 | ORAL_TABLET | Freq: Every day | ORAL | Status: DC
  Administered 2021-10-12: 1 via ORAL
  Filled 2021-10-12: qty 1

## 2021-10-12 MED ORDER — INSULIN ASPART 100 UNIT/ML IJ SOLN: 5.0000 [IU] | Freq: Three times a day (TID) | INTRAMUSCULAR | Status: DC

## 2021-10-12 MED ORDER — INSULIN ASPART 100 UNIT/ML IJ SOLN
5.0000 [IU] | Freq: Three times a day (TID) | INTRAMUSCULAR | 11 refills | Status: DC
Start: 2021-10-12 — End: 2021-10-27

## 2021-10-12 MED ORDER — CEPHALEXIN 250 MG PO CAPS: 250.0000 mg | ORAL_CAPSULE | Freq: Four times a day (QID) | ORAL | 0 refills | Status: AC

## 2021-10-12 MED ORDER — INSULIN DETEMIR 100 UNIT/ML ~~LOC~~ SOLN
20.0000 [IU] | Freq: Every day | SUBCUTANEOUS | Status: DC
  Administered 2021-10-12: 20 [IU] via SUBCUTANEOUS
  Filled 2021-10-12: qty 0.2

## 2021-10-12 MED ORDER — APIXABAN 5 MG PO TABS
5.0000 mg | ORAL_TABLET | Freq: Two times a day (BID) | ORAL | Status: DC
  Administered 2021-10-12: 5 mg via ORAL
  Filled 2021-10-12: qty 1

## 2021-10-12 MED ORDER — INSULIN DETEMIR 100 UNIT/ML ~~LOC~~ SOLN: 20.0000 [IU] | Freq: Every day | SUBCUTANEOUS | 11 refills | Status: DC

## 2021-10-12 MED ORDER — INSULIN ASPART 100 UNIT/ML IJ SOLN: 0.0000 [IU] | Freq: Three times a day (TID) | INTRAMUSCULAR | Status: DC

## 2021-10-12 MED ORDER — INSULIN ASPART 100 UNIT/ML IJ SOLN: 0.0000 [IU] | Freq: Three times a day (TID) | INTRAMUSCULAR | 11 refills | Status: DC

## 2021-10-12 NOTE — ED Notes (Signed)
[facility] called to transport patient  

## 2021-10-12 NOTE — ED Notes (Signed)
[facility] called to cancel transport

## 2021-10-12 NOTE — ED Notes (Signed)
Patient placed on hospital bed, new gown placed and warm blanket applied

## 2021-10-12 NOTE — Progress Notes (Signed)
CSW contacted patients sister to let her know her sister chose [facility] for SNF placement. CSW told patients sister that she will call [facility] to confirm and let her know when patient will be discharge to the facility.

## 2021-10-12 NOTE — Progress Notes (Signed)
4:37 Per EDP, Diabetic Care Team will be consulted about specifics regarding transitioning from pump.  This will likely not occur within time frame for Pt to be transported to [facility] this evening.  Family updated. Sheila with [facility] updated. Floor RN updated.  First shift CSW updated.    4:11 CSW received call from Kitty at [facility] saying that facility cannot accommodate insulin pump. CSW  met with Pt and family at bedside to discuss issue. Upon entering Opt's room CSW encountered Sheila from [facility] who interceded on Pt's behalf.  Sheila was able to negotiate with staff at [facility] and they will accept Pt if insulin pump is d/c'ed and Pt is transitioned to subcutaneous insulin for duration of SNF stay.  EDP updated about this request. 

## 2021-10-12 NOTE — ED Notes (Signed)
Bed bath performed. New gown and linen placed. Denies further needs at this time. New purewick in place

## 2021-10-12 NOTE — ED Notes (Signed)
Lunch ordered 

## 2021-10-12 NOTE — ED Notes (Signed)
Pt requesting home Elequis, no current order, this RN notified EDP , Awaiting orders.

## 2021-10-12 NOTE — Progress Notes (Signed)
CSW met with patient and went over the bed offers from [facility], [facility], [facility], [facility], and [facility]. CSW also provided patient with the facilities ratings from [email or web address]. Patient chose [facility]. Patient gave CSW permission to call her sister to keep her updated.

## 2021-10-12 NOTE — Progress Notes (Signed)
Inpatient Diabetes Program Recommendations  AACE/ADA: New Consensus Statement on Inpatient Glycemic Control (2015)  Target Ranges:  Prepandial:   less than 140 mg/dL      Peak postprandial:   less than 180 mg/dL (1-2 hours)      Critically ill patients:  140 - 180 mg/dL   Lab Results  Component Value Date   GLUCAP 186 (H) 10/12/2021   HGBA1C 10.3 (H) 08/04/2021    Review of Glycemic Control  Received page from MD regarding pt's discharge to [facility] not allowing insulin pumps. MD asked for recs for SQ insulin. See Diabetes Coordinator's note on 10/11/21 regarding pump settings.  Recommendations:  Lantus 20 units Q24H Novolog 0-6 units TID with meals and 0-5 HS Novolog 5 units TID if eating > 50%   Pt should be allowed to wear her Dexcom CGM. Very sensitive to insulin and prefers blood sugars to not go below 150 mg/dL.  Relayed info by telephone to MD.  Thank you. Lorenda Peck, RD, LDN, CDE Inpatient Diabetes Coordinator [phone]

## 2021-10-12 NOTE — ED Notes (Signed)
Breakfast Order placed ?

## 2021-10-12 NOTE — Progress Notes (Signed)
CSW was informed by admissions at [facility] that patient is going to room 211 and number to call report is [phone]. Freda Munro from admissions is also coming around 3 PM to have the family sign paperwork.

## 2021-10-12 NOTE — Progress Notes (Signed)
CSW spoke with [facility] in admissions at [facility] who stated that she could offer patient a bed. Kitty requested auth be started. CSW completed the authorization yesterday and it was approved. CSW will call [employer] and add [facility] to patients authorization.

## 2021-10-12 NOTE — ED Notes (Signed)
Telephone report called to Bandon at [facility]. Patient going to room 211

## 2021-10-13 ENCOUNTER — Encounter: Payer: Self-pay | Admitting: Internal Medicine

## 2021-10-13 ENCOUNTER — Non-Acute Institutional Stay (SKILLED_NURSING_FACILITY): Payer: Medicare PPO | Admitting: Adult Health

## 2021-10-13 ENCOUNTER — Other Ambulatory Visit: Payer: Self-pay

## 2021-10-13 DIAGNOSIS — W19XXXA Unspecified fall, initial encounter: Secondary | ICD-10-CM | POA: Diagnosis not present

## 2021-10-13 DIAGNOSIS — M25562 Pain in left knee: Secondary | ICD-10-CM

## 2021-10-13 DIAGNOSIS — R2681 Unsteadiness on feet: Secondary | ICD-10-CM | POA: Diagnosis not present

## 2021-10-13 DIAGNOSIS — I110 Hypertensive heart disease with heart failure: Secondary | ICD-10-CM | POA: Diagnosis not present

## 2021-10-13 DIAGNOSIS — F339 Major depressive disorder, recurrent, unspecified: Secondary | ICD-10-CM

## 2021-10-13 DIAGNOSIS — K219 Gastro-esophageal reflux disease without esophagitis: Secondary | ICD-10-CM | POA: Diagnosis not present

## 2021-10-13 DIAGNOSIS — R0602 Shortness of breath: Secondary | ICD-10-CM | POA: Diagnosis not present

## 2021-10-13 DIAGNOSIS — I5032 Chronic diastolic (congestive) heart failure: Secondary | ICD-10-CM | POA: Diagnosis not present

## 2021-10-13 DIAGNOSIS — R1312 Dysphagia, oropharyngeal phase: Secondary | ICD-10-CM | POA: Diagnosis not present

## 2021-10-13 DIAGNOSIS — E1142 Type 2 diabetes mellitus with diabetic polyneuropathy: Secondary | ICD-10-CM

## 2021-10-13 DIAGNOSIS — Z743 Need for continuous supervision: Secondary | ICD-10-CM | POA: Diagnosis not present

## 2021-10-13 DIAGNOSIS — R262 Difficulty in walking, not elsewhere classified: Secondary | ICD-10-CM | POA: Diagnosis not present

## 2021-10-13 DIAGNOSIS — N390 Urinary tract infection, site not specified: Secondary | ICD-10-CM

## 2021-10-13 DIAGNOSIS — B370 Candidal stomatitis: Secondary | ICD-10-CM | POA: Diagnosis not present

## 2021-10-13 DIAGNOSIS — M6281 Muscle weakness (generalized): Secondary | ICD-10-CM | POA: Diagnosis not present

## 2021-10-13 DIAGNOSIS — S8992XA Unspecified injury of left lower leg, initial encounter: Secondary | ICD-10-CM | POA: Diagnosis not present

## 2021-10-13 DIAGNOSIS — E1059 Type 1 diabetes mellitus with other circulatory complications: Secondary | ICD-10-CM

## 2021-10-13 DIAGNOSIS — Z8673 Personal history of transient ischemic attack (TIA), and cerebral infarction without residual deficits: Secondary | ICD-10-CM

## 2021-10-13 DIAGNOSIS — R296 Repeated falls: Secondary | ICD-10-CM | POA: Diagnosis not present

## 2021-10-13 DIAGNOSIS — I10 Essential (primary) hypertension: Secondary | ICD-10-CM | POA: Diagnosis not present

## 2021-10-13 DIAGNOSIS — R531 Weakness: Secondary | ICD-10-CM | POA: Diagnosis not present

## 2021-10-13 DIAGNOSIS — Z79899 Other long term (current) drug therapy: Secondary | ICD-10-CM | POA: Diagnosis not present

## 2021-10-13 DIAGNOSIS — G4733 Obstructive sleep apnea (adult) (pediatric): Secondary | ICD-10-CM

## 2021-10-13 DIAGNOSIS — K5901 Slow transit constipation: Secondary | ICD-10-CM | POA: Diagnosis not present

## 2021-10-13 DIAGNOSIS — Z9189 Other specified personal risk factors, not elsewhere classified: Secondary | ICD-10-CM | POA: Diagnosis not present

## 2021-10-13 DIAGNOSIS — I480 Paroxysmal atrial fibrillation: Secondary | ICD-10-CM | POA: Diagnosis not present

## 2021-10-13 DIAGNOSIS — I69391 Dysphagia following cerebral infarction: Secondary | ICD-10-CM | POA: Diagnosis not present

## 2021-10-13 DIAGNOSIS — Z20822 Contact with and (suspected) exposure to covid-19: Secondary | ICD-10-CM | POA: Diagnosis not present

## 2021-10-13 DIAGNOSIS — D72819 Decreased white blood cell count, unspecified: Secondary | ICD-10-CM | POA: Diagnosis not present

## 2021-10-13 DIAGNOSIS — E890 Postprocedural hypothyroidism: Secondary | ICD-10-CM | POA: Diagnosis not present

## 2021-10-13 DIAGNOSIS — E119 Type 2 diabetes mellitus without complications: Secondary | ICD-10-CM | POA: Diagnosis not present

## 2021-10-13 DIAGNOSIS — W19XXXS Unspecified fall, sequela: Secondary | ICD-10-CM | POA: Diagnosis not present

## 2021-10-13 LAB — CBG MONITORING, ED
Glucose-Capillary: 167 mg/dL — ABNORMAL HIGH (ref 70–99)
Glucose-Capillary: 178 mg/dL — ABNORMAL HIGH (ref 70–99)
Glucose-Capillary: 178 mg/dL — ABNORMAL HIGH (ref 70–99)

## 2021-10-13 NOTE — ED Notes (Signed)
[facility] here to transport pt, paperwork given. Patient d/c.

## 2021-10-13 NOTE — Progress Notes (Signed)
CSW spoke with [name] at [facility] at 9:55 AM after patient had been discharged. CSW was informed that patient was sent to their facility without their knowledge. CSW was told that report was not called and there were no new orders for patients insulin.  CSW stated she is not sure what happened because she was not working at this time. Kitty asked if they can speak with someone as to why this occurred. CSW stated she would reach out to the director.

## 2021-10-13 NOTE — Progress Notes (Addendum)
Location:  [facility] Room Number: 211 A Place of Service:  SNF (31) Provider:  Durenda Age, DNP, FNP-BC  Patient Care Team: Eulis Foster, MD as PCP - General (Family Medicine) Jerline Pain, MD as PCP - Cardiology (Cardiology) Constance Haw, MD as PCP - Electrophysiology (Cardiology) Kennith Center, RD as Dietitian (Family Medicine) Maurine Cane, LCSW as Social Worker (Licensed Clinical Social Worker) Lazaro Arms, RN as [place] Management  Extended Emergency Contact Information Primary Emergency Contact: [place] Phone: [phone] Mobile Phone: [phone] Relation: Sister Secondary Emergency Contact: Lee,Candance Mobile Phone: [phone] Relation: Niece  Code Status:    Goals of care: Advanced Directive information Advanced Directives 10/13/2021  Does Patient Have a Medical Advance Directive? No  Type of Advance Directive -  Does patient want to make changes to medical advance directive? -  Copy of [place] in Chart? -  Would patient like information on creating a medical advance directive? No - Patient declined     Chief Complaint  Patient presents with   Acute Visit    Follow-up hospitalization    HPI:  Pt is a 70 y.o. female who was admitted to [facility] on on 10/12/2021 post ED stay 10/10/2021 to 10/12/2021.  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  She fell at home while trying to get to the bathroom.  EMS came to her home and put her back in bed.  She is typically uses a wheelchair to get around but when she goes to the bathroom she has to stand up and walk from the doorway to the commode.  She reported getting her feet tangled up causing her to fall injuring her left knee.  Imaging and lab work were unremarkable.  She had signs of urinary tract infection and culture showed 60,000 colonies/mL Enterococcus faecalis. She denied vomiting, fever, chills, cough or shortness of breath.  She was treated with Keflex for possible UTI.  She noticed increasing pain that made it hard to move the left knee.  She refused to leave ED stating that she needed further testing and treatment.  She asked for PT consultation.  CT head showed no evidence of acute infarction, hemorrhage nor hydrocephalus.  She has chronic atrophic changes without acute abnormality.  PT has noted that she desaturates while walking.  Last cardiac echo done August 2020 through high EF greater than 55%.  She was seen in her room today.  Noted tongue with whitish coating. She denies having oral pain. Noted to have ace wrap on her left knee which she thought was  for support due to left-sided weakness.    Past Medical History:  Diagnosis Date   Abnormal echocardiogram    Anemia    Arthritis    BPPV (benign paroxysmal positional vertigo), right 06/13/2017   Bradycardia 03/07/2020   Cerebrovascular accident (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 ([place]) 10/29/2015   CHB (complete heart block) ([place]) 03/08/2020   CHF (congestive heart failure) ([place])    Closed fracture of left proximal humerus 07/08/2019   Decreased range of motion of left shoulder 09/03/2020   Depression    Dizzy 03/07/2020   bradycardia   Edentulous 09/03/2020   GERD (gastroesophageal reflux disease)    Hemiparesis affecting left side as late effect of cerebrovascular accident (CVA) ([place]) 03/08/2020   Hemorrhoids  History of Falls with injury 03/08/2020   Fall resulting in left humeral fracture   Hyperlipidemia    Hypertension    Hypothyroidism    Intracranial vascular stenosis    Lacunar infarction [facility])    Brain MRI 07/2021 multiple small remote pontine lacunar infarcts   Near syncope 03/08/2020   Paroxysmal atrial fibrillation  (HCC)    Presence of permanent cardiac pacemaker    Proteinuria 03/08/2020   Seasonal allergies    Stroke [facility])    Thyroid cancer ([place])    per pt S/p Total Thyroidectomy with Radioactive Iodine Therapy   Type 1 diabetes mellitus ([place])    Past Surgical History:  Procedure Laterality Date   ABDOMINAL ANGIOGRAM N/A 05/14/2012   Procedure: ABDOMINAL ANGIOGRAM;  Surgeon: Laverda Page, MD;  Location: [facility] CATH LAB;  Service: Cardiovascular;  Laterality: N/A;   ABDOMINAL HYSTERECTOMY  1995   partial   ANKLE FRACTURE SURGERY Right    CARDIAC CATHETERIZATION     with coronary angiogram   cataract  Bilateral 2018   COLONOSCOPY N/A 09/09/2014   Procedure: COLONOSCOPY;  Surgeon: Gatha Mayer, MD;  Location: [facility];  Service: Endoscopy;  Laterality: N/A;   ESOPHAGOGASTRODUODENOSCOPY (EGD) WITH PROPOFOL N/A 12/03/2018   Procedure: ESOPHAGOGASTRODUODENOSCOPY (EGD) WITH PROPOFOL;  Surgeon: Doran Stabler, MD;  Location: [facility];  Service: Gastroenterology;  Laterality: N/A;   LEFT AND RIGHT HEART CATHETERIZATION WITH CORONARY ANGIOGRAM N/A 05/14/2012   Procedure: LEFT AND RIGHT HEART CATHETERIZATION WITH CORONARY ANGIOGRAM;  Surgeon: Laverda Page, MD;  Location: [facility] CATH LAB;  Service: Cardiovascular;  Laterality: N/A;   LOOP RECORDER INSERTION N/A 12/23/2018   Procedure: LOOP RECORDER INSERTION;  Surgeon: Constance Haw, MD;  Location: [facility] CV LAB;  Service: Cardiovascular;  Laterality: N/A;   LOOP RECORDER REMOVAL N/A 06/25/2020   Procedure: LOOP RECORDER REMOVAL;  Surgeon: Constance Haw, MD;  Location: [place] CV LAB;  Service: Cardiovascular;  Laterality: N/A;   MINI METER   09/08/2014   ELECTRONIC INSULIN PUMP   PACEMAKER IMPLANT N/A 06/25/2020   Procedure: PACEMAKER IMPLANT;  Surgeon: Constance Haw, MD;  Location: [place] CV LAB;  Service: Cardiovascular;  Laterality: N/A;   TEE WITHOUT CARDIOVERSION N/A 10/16/2018   Procedure:  TRANSESOPHAGEAL ECHOCARDIOGRAM (TEE);  Surgeon: Buford Dresser, MD;  Location: [facility] ENDOSCOPY;  Service: Cardiovascular;  Laterality: N/A;   THYROIDECTOMY  2006   for thyroid cancer    Allergies  Allergen Reactions   Latex Rash    Outpatient Encounter Medications as of 10/13/2021  Medication Sig   acetaminophen (TYLENOL) 325 MG tablet Take 2 tablets (650 mg total) by mouth every 6 (six) hours as needed for moderate pain.   amLODipine (NORVASC) 10 MG tablet Take 1 tablet (10 mg total) by mouth daily.   apixaban (ELIQUIS) 5 MG TABS tablet Take 1 tablet (5 mg total) by mouth 2 (two) times daily.   atorvastatin (LIPITOR) 80 MG tablet Take 1 tablet (80 mg total) by mouth every evening.   cephALEXin (KEFLEX) 250 MG capsule Take 1 capsule (250 mg total) by mouth 4 (four) times daily.   cephALEXin (KEFLEX) 250 MG capsule Take 1 capsule (250 mg total) by mouth 4 (four) times daily for 3 days.   Cholecalciferol (VITAMIN D) 50 MCG (2000 UT) tablet Take 2,000 Units by mouth daily.   Cranberry 425 MG CAPS Take 1 capsule by mouth daily.   fexofenadine (ALLEGRA) 180 MG tablet Take 180 mg by mouth daily  as needed for allergies or rhinitis.   FLUoxetine (PROZAC) 10 MG capsule Take 3 capsules (30 mg total) by mouth daily.   FLUoxetine (PROZAC) 20 MG tablet Take 1 tablet (20 mg total) by mouth daily.   gabapentin (NEURONTIN) 300 MG capsule Take 1 capsule (300 mg total) by mouth 3 (three) times daily.   insulin aspart (NOVOLOG) 100 UNIT/ML injection Inject 5 Units into the skin 3 (three) times daily before meals.   insulin aspart (NOVOLOG) 100 UNIT/ML injection Inject 0-6 Units into the skin 3 (three) times daily before meals. 0-6 Units, Subcutaneous, 3 times daily with meals, First dose tomorrow at 0800 Correction coverage: Very Sensitive (ESRD/Dialysis) CBG < 70: Implement Hypoglycemia Standing Orders and refer to Hypoglycemia Standing Orders sidebar report CBG 70 - 120: 0 units CBG 121 - 150: 0  units CBG 151 - 200: 1 unit CBG 201-250: 2 units CBG 251-300: 3 units CBG 301-350: 4 units CBG 351-400: 5 units CBG > 400: Give 6 units and call MD   insulin detemir (LEVEMIR) 100 UNIT/ML injection Inject 0.2 mLs (20 Units total) into the skin at bedtime.   Insulin Human (INSULIN PUMP) SOLN Inject 1 each into the skin 3 times daily with meals, bedtime and 2 AM. (Patient taking differently: Inject 1 each into the skin 3 times daily with meals, bedtime and 2 AM. Novolog)   levothyroxine (SYNTHROID) 175 MCG tablet Take 1 tablet (175 mcg total) by mouth daily before breakfast.   Multiple Vitamins-Minerals (OCUVITE PO) Take 1 tablet by mouth daily.   omega-3 acid ethyl esters (LOVAZA) 1 g capsule Take 1 capsule (1 g total) by mouth daily.   pantoprazole (PROTONIX) 40 MG tablet Take 1 tablet (40 mg total) by mouth 2 (two) times daily.   potassium chloride (KLOR-CON) 10 MEQ tablet Take 1 tablet (10 mEq total) by mouth daily.   torsemide (DEMADEX) 10 MG tablet Take 1 tablet (10 mg total) by mouth daily.   traMADol (ULTRAM) 50 MG tablet Take 1 tablet (50 mg total) by mouth every 12 (twelve) hours as needed for severe pain.   No facility-administered encounter medications on file as of 10/13/2021.    Review of Systems  GENERAL: No change in appetite, no fatigue, no weight changes, no fever or chills  MOUTH and THROAT: Denies oral discomfort, gingival pain or bleeding RESPIRATORY: no cough, SOB, DOE, wheezing, hemoptysis CARDIAC: No chest pain, edema or palpitations GI: No abdominal pain, diarrhea, constipation, heart burn, nausea or vomiting GU: Denies dysuria, frequency, hematuria, incontinence, or discharge NEUROLOGICAL: Denies dizziness, syncope, numbness, or headache PSYCHIATRIC: Denies feelings of depression or anxiety. No report of hallucinations, insomnia, paranoia, or agitation    Immunization History  Administered Date(s) Administered   Influenza, High Dose Seasonal PF 10/12/2017    Influenza,inj,Quad PF,6+ Mos 08/20/2013, 09/30/2014, 09/22/2015, 09/24/2016, 12/09/2018, 09/24/2019   Influenza-Unspecified 09/24/2017   PFIZER(Purple Top)SARS-COV-2 Vaccination 06/28/2020   Pneumococcal Conjugate-13 11/24/2016   Pneumococcal Polysaccharide-23 01/25/2012, 06/07/2018   Tdap 01/25/2012   Zoster Recombinat (Shingrix) 04/11/2017   Pertinent  Health Maintenance Due  Topic Date Due   DEXA SCAN  12/29/2015   OPHTHALMOLOGY EXAM  12/28/2017   INFLUENZA VACCINE  07/25/2021   FOOT EXAM  12/10/2021   HEMOGLOBIN A1C  02/04/2022   MAMMOGRAM  04/06/2022   COLONOSCOPY (Pts 45-35yrs Insurance coverage will need to be confirmed)  09/09/2024   Fall Risk  09/06/2021 06/29/2021 01/03/2021 09/02/2020 08/10/2020  Falls in the past year? 1 0 0 1 1  Number  falls in past yr: 0 0 0 1 0  Injury with Fall? 0 0 0 1 1  Comment - - - - -  Risk Factor Category  - - - - -  Risk for fall due to : - - (No Data) Impaired balance/gait;History of fall(s);Impaired mobility -  Risk for fall due to: Comment - - in an electric wheelchair - -  Follow up - - Falls evaluation completed - -  Comment - - - - -     Vitals:   10/13/21 1000  BP: 118/78  Pulse: 71  Resp: 19  Temp: 97.6 F (36.4 C)  Weight: 263 lb (119.3 kg)  Height: 5\' 7"  (1.702 m)   Body mass index is 41.19 kg/m.  Physical Exam  GENERAL APPEARANCE: Well nourished. In no acute distress. Morbidly obese. SKIN:  Skin is warm and dry.  MOUTH and THROAT: Lips are without lesions. Tongue with whitish coating RESPIRATORY: Breathing is even & unlabored, BS CTAB CARDIAC: RRR, no murmur,no extra heart sounds, no edema. Left chest pacemaker GI: Abdomen soft, normal BS, no masses, no tenderness NEUROLOGICAL: There is no tremor. Speech is clear. Left-sided weakness. PSYCHIATRIC: Affect and behavior are appropriate  Labs reviewed: Recent Labs    08/04/21 2303 08/05/21 0440 08/29/21 1044 10/10/21 0850 10/11/21 1336  NA  --    < > 138 135 137   K  --    < > 4.0 4.0 3.6  CL  --    < > 102 103 102  CO2  --    < > 25 24 27   GLUCOSE  --    < > 259* 234* 203*  BUN  --    < > 23 10 10   CREATININE  --    < > 1.36* 1.13* 1.17*  CALCIUM  --    < > 9.4 8.6* 8.4*  MG 1.8  --   --   --   --    < > = values in this interval not displayed.   Recent Labs    08/04/21 1647 08/04/21 2303 08/11/21 0456  AST 27 21 20   ALT 24 22 19   ALKPHOS 91 78 78  BILITOT 0.8 0.5 0.5  PROT 6.7 6.0* 6.1*  ALBUMIN 3.2* 2.8* 2.8*   Recent Labs    08/11/21 0456 08/22/21 0617 08/29/21 1044 10/10/21 0850  WBC 4.5 3.6* 3.3* 5.2  NEUTROABS 2.1 1.4* 1.8  --   HGB 11.4* 11.4* 12.5 11.2*  HCT 36.3 36.4 40.2 36.6  MCV 89.4 90.3 91.8 92.4  PLT 179 175 143* 242   Lab Results  Component Value Date   TSH 1.610 08/04/2021   Lab Results  Component Value Date   HGBA1C 10.3 (H) 08/04/2021   Lab Results  Component Value Date   CHOL 102 08/04/2021   HDL 44 08/04/2021   LDLCALC 50 08/04/2021   LDLDIRECT 75 01/25/2012   TRIG 39 08/04/2021   CHOLHDL 2.3 08/04/2021    Significant Diagnostic Results in last 30 days:  DG Chest 2 View  Result Date: 10/10/2021 CLINICAL DATA:  Shortness of breath, fall after trying to come out of the bathroom EXAM: CHEST - 2 VIEW COMPARISON:  Chest radiograph 08/05/2021 FINDINGS: A left chest wall cardiac device is stable. A defibrillator pad overlies the chest. The heart is enlarged, unchanged. There is vascular congestion and possible mild pulmonary interstitial edema. There is no focal consolidation. There is no significant pleural effusion. There is no pneumothorax. There is no  acute osseous abnormality. IMPRESSION: Mild cardiomegaly with vascular congestion and possible mild pulmonary interstitial edema. Electronically Signed   By: Valetta Mole M.D.   On: 10/10/2021 13:40   DG Knee 2 Views Left  Result Date: 10/10/2021 CLINICAL DATA:  Fall, pain EXAM: LEFT KNEE - 1-2 VIEW COMPARISON:  Left knee radiographs 07/06/2019  FINDINGS: There is no acute fracture or dislocation. Knee alignment is normal. There is mild medial and lateral tibiofemoral joint space narrowing with minimal associated osteophytosis. There is no effusion. The soft tissues are unremarkable. IMPRESSION: No acute fracture or dislocation. Electronically Signed   By: Valetta Mole M.D.   On: 10/10/2021 16:21   CT Head Wo Contrast  Result Date: 10/10/2021 CLINICAL DATA:  Neurological deficit EXAM: CT HEAD WITHOUT CONTRAST TECHNIQUE: Contiguous axial images were obtained from the base of the skull through the vertex without intravenous contrast. COMPARISON:  08/04/2021 FINDINGS: Brain: No evidence of acute infarction, hemorrhage, hydrocephalus, extra-axial collection or mass lesion/mass effect. Chronic atrophic changes are noted. Vascular: No hyperdense vessel or unexpected calcification. Skull: Normal. Negative for fracture or focal lesion. Sinuses/Orbits: No acute finding. Other: None. IMPRESSION: Chronic atrophic changes without acute abnormality. Electronically Signed   By: Inez Catalina M.D.   On: 10/10/2021 19:56   CUP PACEART REMOTE DEVICE CHECK  Result Date: 10/06/2021 Scheduled remote reviewed. Normal device function.  There were three atrial fib arrhythmias detected, one was greater than 99 hours, AF burden is 28.8% of the time, ? [place], sent to triage. Next remote 91 days. Kathy Breach, RN, CCDS, [employer]   Assessment/Plan  1. Acute pain of left knee -  S/P fall at home -  imaging was negative for fracture -   for PT and OT, for therapeutic strengthening exercises -    Continue Ultram 50 mg every 12 hours PRN for pain  2. Urinary tract infection without hematuria, site unspecified -    Urine culture showed 60,000 colonies/mL Enterococcus faecalis -    Was treated with Keflex  3. 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 History of CVA (cerebrovascular accident) -   Has left-sided weakness -   Continue Eliquis and Lipitor  5. Chronic diastolic congestive heart failure (HCC) -Stable, continue torsemide  6. Paroxysmal atrial fibrillation (HCC) -Rate controlled, continue Eliquis for anticoagulation  7. Major depression, recurrent, chronic (HCC) -  mood is stable, continue Prozac 30 mg daily -   Referred for psych consult  8.  Diabetic peripheral neuropathy -    Continue Neurontin  9.  Obstructive sleep apnea -   CPAP at bedtime  10. Oral Yeast Infection -  will start on Nystatin suspension 100,000 units/ml swish and spit 5 ml QID X 2 weeks -  oral care daily     Family/ staff Communication:   Discussed plan of care with resident and charge nurse.  Labs/tests ordered: None  Goals of care:   Short-term care   Durenda Age, DNP, MSN, FNP-BC [facility] [phone] (Monday-Friday 8:00 a.m. - 5:00 p.m.) [phone] (after hours)

## 2021-10-13 NOTE — ED Notes (Signed)
PTAR Called 

## 2021-10-13 NOTE — ED Notes (Signed)
Stopped pt insulin pump. Placed item in red bag and placed in pt black bag.

## 2021-10-14 ENCOUNTER — Telehealth: Payer: Self-pay

## 2021-10-14 ENCOUNTER — Telehealth: Payer: Medicare PPO

## 2021-10-14 ENCOUNTER — Non-Acute Institutional Stay (SKILLED_NURSING_FACILITY): Payer: Medicare PPO | Admitting: Internal Medicine

## 2021-10-14 ENCOUNTER — Encounter: Payer: Self-pay | Admitting: Internal Medicine

## 2021-10-14 DIAGNOSIS — I480 Paroxysmal atrial fibrillation: Secondary | ICD-10-CM

## 2021-10-14 DIAGNOSIS — R0602 Shortness of breath: Secondary | ICD-10-CM | POA: Diagnosis not present

## 2021-10-14 DIAGNOSIS — E1059 Type 1 diabetes mellitus with other circulatory complications: Secondary | ICD-10-CM | POA: Diagnosis not present

## 2021-10-14 DIAGNOSIS — Z9189 Other specified personal risk factors, not elsewhere classified: Secondary | ICD-10-CM | POA: Diagnosis not present

## 2021-10-14 DIAGNOSIS — W19XXXS Unspecified fall, sequela: Secondary | ICD-10-CM | POA: Diagnosis not present

## 2021-10-14 NOTE — Patient Instructions (Signed)
See assessment and plan under each diagnosis in the problem list and acutely for this visit 

## 2021-10-14 NOTE — Assessment & Plan Note (Signed)
Heart rhythm is irregular today.  She was notified by cardiology 10/14 that she did have PAF documented.

## 2021-10-14 NOTE — Progress Notes (Signed)
NURSING HOME LOCATION:  [facility] ROOM NUMBER:  [id number]  CODE STATUS:  Full Code  PCP:  Eulis Foster MD  This is a comprehensive admission note to this SNFperformed on this date less than 30 days from date of admission. Included are preadmission medical/surgical history; reconciled medication list; family history; social history and comprehensive review of systems.  Corrections and additions to the records were documented. Comprehensive physical exam was also performed. Additionally a clinical summary was entered for each active diagnosis pertinent to this admission in the Problem List to enhance continuity of care.  HPI: The patient was in the ED 10/17 - 10/12/2021 with weakness resulting in a fall.  She developed acute knee pain following the fall.  She denied any cardiac or neurologic prodrome prior to the fall; but she states that she was contacted 10/14 by Cardiology stating that she had had PAF documented.  She is typically wheelchair-bound but has difficulty mobilizing in her home as the doors are narrow forcing her to try to ambulate intermittently.  In attempting to go to and return from the bathroom she fell sustaining the knee injury.  She states that the left leg was too weak to allow her to ambulate.  CT of the knee revealed chronic atrophic changes but no fracture. While in the ED she did complain of shortness of breath.  Troponin peaked at 38.  EKG revealed first-degree AV block with frequent ventricular paced beats and diffuse nonspecific T changes.  Prolonged QT interval was also noted.  Compared to prior EKG rate was slower.  Chest x-ray revealed mild cardiomegaly with vascular congestion with possible early pulmonary interstitial edema.  BNP was mildly elevated and peaked at 240.2. There was clinical suspicion of UTI based on UA.  She was placed on Keflex.  Final C&S revealed 60,000 colonies Enterococcus faecaelis. Culture did not reveal greater  than 100,000 colonies of 1 organism to meet criteria for true UTI. She was not felt to meet criteria for inpatient care and arrangements were made for discharge to SNF for PT/OT.  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  Additionally severe chronic microvascular ischemic changes of the pons and left middle cerebellar peduncle were present along with many remote lacunar infarcts.  These changes were progressive compared to imaging performed 2019. 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 Procedures and surgeries include pacemaker implant and thyroidectomy.  Social history: Nondrinker, never smoked.  She is retired Company secretary.  Family history: Extensive history reviewed.   Review of systems: Despite her significant neurovascular history; she scored 15 out of 15 on the BIMS mental assessment test.  She was an excellent historian.  She states that she had been on insulin pump since 2005.  The facility is not capable of employing this so she was switched to multiple dose regimen with additional coverage based on significant hyperglycemia.She states that her A1c was 9.1% in September.  On 08/04/2021 A1c was 10.3% in Epic. She does have tramadol for pain but states that she has only taken 10 of the 14 pills prescribed in August.  This is significant as she is on relatively high-dose fluoxetine for depression. She has had repeated trauma to the lower extremities  which she relates to "I am not a good wheelchair driver". She is BiPAP compliant.  Constitutional: No fever, significant weight change  Eyes: No redness, discharge, pain, vision  change ENT/mouth: No nasal congestion, purulent discharge, earache, change in hearing, sore throat  Cardiovascular: No chest pain, palpitations, paroxysmal nocturnal dyspnea Respiratory: No cough, sputum production, hemoptysis  Gastrointestinal: No heartburn, dysphagia, abdominal pain, nausea /vomiting, rectal bleeding, melena, change in bowels Genitourinary: No dysuria, hematuria, pyuria, incontinence, nocturia Dermatologic: No rash, pruritus, change in appearance of skin Neurologic: No dizziness, headache, syncope, seizures, numbness, tingling Psychiatric: No significant  insomnia, anorexia Endocrine: No change in hair/skin/nails, excessive thirst, excessive hunger, excessive urination  Hematologic/lymphatic: No significant bruising, lymphadenopathy, abnormal bleeding Allergy/immunology: No itchy/watery eyes, significant sneezing, urticaria, angioedema  Physical exam: Pertinent or positive findings:Central weight excess is present.  She exhibits slight dysarthria with slight halting speech pattern.  Eyebrows are decreased laterally; she has arcus senilis.  There is slight ptosis on the left.  There is also slight exotropia on the left.  The left nasolabial fold is slightly decreased.  She is not wearing the upper plate.  There is a very faint well-healed operative scar over the anterior neck.  Heart sounds are distant and rate is slow.  She has occasional extra beats.  Breath sounds are decreased.  Pedal pulses are decreased.  She has trace edema at the sock line.  The right upper and right lower extremities are stronger than the left upper and left lower extremities.  Multiple rounded eschar lesions are present over the right shin.  The left upper shin is dressed.  General appearance: Adequately nourished; no acute distress, increased work of breathing is present.   Lymphatic: No lymphadenopathy about the head, neck, axilla. Eyes: No conjunctival inflammation or lid edema is present. There is no  scleral icterus. Ears:  External ear exam shows no significant lesions or deformities.   Nose:  External nasal examination shows no deformity or inflammation. Nasal mucosa are pink and moist without lesions, exudates Neck:  No thyromegaly, masses, tenderness noted.    Heart:  No gallop, murmur, click, rub.  Lungs:  without wheezes, rhonchi, rales, rubs. Abdomen: Bowel sounds are normal.  Abdomen is soft and nontender with no organomegaly, hernias, masses. GU: Deferred  Extremities:  No cyanosis, clubbing. Neurologic exam: Balance, Rhomberg, finger to nose testing could not be completed due to clinical state Skin: Warm & dry w/o tenting.  See clinical summary under each active problem in the Problem List with associated updated therapeutic plan

## 2021-10-14 NOTE — Assessment & Plan Note (Addendum)
Here at the SNF insulin pump will be deferred to adjusted Levimir doses with additional ac coverage for hyperglycemia.

## 2021-10-14 NOTE — Assessment & Plan Note (Addendum)
Shortness of breath while in the ED 10/17 - 10/12/2021 chest x-ray revealed mild cardiomegaly with vascular congestion and possible early pulm interstitial edema.  BNP mildly elevated with peak of 240.2.  EKG essentially unchanged.  Troponin peak 38. No dyspnea today. Edema clinically insignificant.

## 2021-10-14 NOTE — Assessment & Plan Note (Signed)
10/13/2021 she states she has only taken 10 tramadol total since it was prescribed in August.  She understood the potential drug drug interaction with fluoxetine.  She feels she can do without the tramadol.

## 2021-10-14 NOTE — Assessment & Plan Note (Signed)
10/17 - 10/12/2021 seen in the ED following a fall while attempting to mobilize to and from the wheelchair to the bathroom.  No cardiac or neurologic prodrome.  Left knee pain and left leg weakness prompted ED visit.  Imaging revealed no knee fracture. PT/OT as tolerated at the SNF.

## 2021-10-14 NOTE — Progress Notes (Signed)
Remote pacemaker transmission.   

## 2021-10-14 NOTE — Telephone Encounter (Signed)
   RN Case Manager Care Management Admission Note 10/14/2021   RNCM notified that [facility] [name]  MRN: [id number] DOB: 1951-08-13 was admitted to [facility].  Plan:  RNCM will send notification to care guide and we will monitor the patients progress for follow up. We will re check in 2-3 weeks  Lazaro Arms RN, BSN, [place] Phone: [phone] Fax: [phone]

## 2021-10-18 ENCOUNTER — Inpatient Hospital Stay: Payer: Medicare PPO | Admitting: Adult Health

## 2021-10-18 ENCOUNTER — Other Ambulatory Visit: Payer: Self-pay | Admitting: Family Medicine

## 2021-10-21 ENCOUNTER — Non-Acute Institutional Stay (SKILLED_NURSING_FACILITY): Payer: Medicare PPO | Admitting: Adult Health

## 2021-10-21 ENCOUNTER — Encounter: Payer: Self-pay | Admitting: Adult Health

## 2021-10-21 DIAGNOSIS — Z8673 Personal history of transient ischemic attack (TIA), and cerebral infarction without residual deficits: Secondary | ICD-10-CM

## 2021-10-21 DIAGNOSIS — G4733 Obstructive sleep apnea (adult) (pediatric): Secondary | ICD-10-CM | POA: Diagnosis not present

## 2021-10-21 DIAGNOSIS — E1059 Type 1 diabetes mellitus with other circulatory complications: Secondary | ICD-10-CM | POA: Diagnosis not present

## 2021-10-21 DIAGNOSIS — I480 Paroxysmal atrial fibrillation: Secondary | ICD-10-CM | POA: Diagnosis not present

## 2021-10-21 DIAGNOSIS — B370 Candidal stomatitis: Secondary | ICD-10-CM | POA: Diagnosis not present

## 2021-10-21 DIAGNOSIS — I10 Essential (primary) hypertension: Secondary | ICD-10-CM

## 2021-10-21 NOTE — Progress Notes (Addendum)
Location:  [place] Room Number: 211 A Place of Service:  SNF (31) Provider:  Durenda Age, DNP, FNP-BC  Patient Care Team: Eulis Foster, MD as PCP - General (Family Medicine) Jerline Pain, MD as PCP - Cardiology (Cardiology) Constance Haw, MD as PCP - Electrophysiology (Cardiology) Kennith Center, RD as Dietitian (Family Medicine) Maurine Cane, LCSW as Social Worker (Licensed Clinical Social Worker) Lazaro Arms, RN as [place] Management  Extended Emergency Contact Information Primary Emergency Contact: [place] Phone: [phone] Mobile Phone: [phone] Relation: Sister Secondary Emergency Contact: Lee,Candance Mobile Phone: [phone] Relation: Niece  Code Status:  FULL CODE  Goals of care: Advanced Directive information Advanced Directives 10/21/2021  Does Patient Have a Medical Advance Directive? No  Type of Advance Directive -  Does patient want to make changes to medical advance directive? -  Copy of [place] in Chart? -  Would patient like information on creating a medical advance directive? -     Chief Complaint  Patient presents with   Acute Visit    Short-term rehabilitation    HPI:  Pt is a 70 y.o. female seen today for short-term rehabilitation visit.  She is currently having PT, OT and ST. She was seen today in her room. She continues to take Nystatin suspension 1000 units/ml swish and spit 5 mL for a total of 2 weeks, till 10/27/2021, for oral Candida.  Her tongue still noted to have whitish coating.  CBGs ranging from 159 to 339, with outlier 73, 92 and 502.  She takes NovoLog 5 units TID for CBG >= 250 Levemir 20 units at bedtime for diabetes mellitus. SBPs ranging from 110-135.  She takes Norvasc 10 mg 1 tab daily for hypertension. Niece has taken CPAP to her house since she does not use it at the facility.  Discussed importance of CPAP. Niece agreed to bring back CPAP.   Past Medical History:  Diagnosis Date   Abnormal echocardiogram    Anemia    Arthritis    BPPV (benign paroxysmal positional vertigo), right 06/13/2017   Bradycardia 03/07/2020   Cerebrovascular accident (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 ([place]) 10/29/2015   CHB (complete heart block) ([place]) 03/08/2020   CHF (congestive heart failure) ([place])    Closed fracture of left proximal humerus 07/08/2019   Decreased range of motion of left shoulder 09/03/2020   Depression    Dizzy 03/07/2020   bradycardia   Edentulous 09/03/2020   GERD (gastroesophageal reflux disease)    Hemiparesis affecting left side as late effect of cerebrovascular accident (CVA) ([place]) 03/08/2020   Hemorrhoids    History of Falls with injury 03/08/2020   Fall resulting in left humeral fracture   Hyperlipidemia    Hypertension    Hypothyroidism    Intracranial vascular stenosis    Lacunar infarction ([place])    Brain MRI 07/2021 multiple small remote pontine lacunar infarcts   Near syncope 03/08/2020   Paroxysmal atrial fibrillation (HCC)    Presence of permanent cardiac pacemaker    Proteinuria 03/08/2020   Seasonal allergies    Stroke ([place])    Thyroid cancer ([place])    per pt S/p Total Thyroidectomy with Radioactive Iodine Therapy   Type 1 diabetes mellitus ([place])    Past Surgical History:  Procedure Laterality Date   ABDOMINAL ANGIOGRAM N/A 05/14/2012   Procedure: ABDOMINAL ANGIOGRAM;  Surgeon: Laverda Page, MD;  Location: [facility] CATH  LAB;  Service: Cardiovascular;  Laterality: N/A;   ABDOMINAL HYSTERECTOMY  1995   partial   ANKLE FRACTURE SURGERY Right    CARDIAC CATHETERIZATION     with coronary angiogram   cataract  Bilateral 2018   COLONOSCOPY N/A 09/09/2014   Procedure: COLONOSCOPY;  Surgeon: Gatha Mayer, MD;  Location: [place];  Service: Endoscopy;  Laterality: N/A;   ESOPHAGOGASTRODUODENOSCOPY (EGD) WITH PROPOFOL N/A  12/03/2018   Procedure: ESOPHAGOGASTRODUODENOSCOPY (EGD) WITH PROPOFOL;  Surgeon: Doran Stabler, MD;  Location: [place];  Service: Gastroenterology;  Laterality: N/A;   LEFT AND RIGHT HEART CATHETERIZATION WITH CORONARY ANGIOGRAM N/A 05/14/2012   Procedure: LEFT AND RIGHT HEART CATHETERIZATION WITH CORONARY ANGIOGRAM;  Surgeon: Laverda Page, MD;  Location: [facility] CATH LAB;  Service: Cardiovascular;  Laterality: N/A;   LOOP RECORDER INSERTION N/A 12/23/2018   Procedure: LOOP RECORDER INSERTION;  Surgeon: Constance Haw, MD;  Location: [place] CV LAB;  Service: Cardiovascular;  Laterality: N/A;   LOOP RECORDER REMOVAL N/A 06/25/2020   Procedure: LOOP RECORDER REMOVAL;  Surgeon: Constance Haw, MD;  Location: [place] CV LAB;  Service: Cardiovascular;  Laterality: N/A;   MINI METER   09/08/2014   ELECTRONIC INSULIN PUMP   PACEMAKER IMPLANT N/A 06/25/2020   Procedure: PACEMAKER IMPLANT;  Surgeon: Constance Haw, MD;  Location: [place] CV LAB;  Service: Cardiovascular;  Laterality: N/A;   TEE WITHOUT CARDIOVERSION N/A 10/16/2018   Procedure: TRANSESOPHAGEAL ECHOCARDIOGRAM (TEE);  Surgeon: Buford Dresser, MD;  Location: [facility] ENDOSCOPY;  Service: Cardiovascular;  Laterality: N/A;   THYROIDECTOMY  2006   for thyroid cancer    Allergies  Allergen Reactions   Latex Rash    Outpatient Encounter Medications as of 10/21/2021  Medication Sig   acetaminophen (TYLENOL) 325 MG tablet Take 2 tablets (650 mg total) by mouth every 6 (six) hours as needed for moderate pain.   amLODipine (NORVASC) 10 MG tablet Take 1 tablet (10 mg total) by mouth daily.   apixaban (ELIQUIS) 5 MG TABS tablet Take 1 tablet (5 mg total) by mouth 2 (two) times daily.   atorvastatin (LIPITOR) 80 MG tablet Take 1 tablet (80 mg total) by mouth every evening.   bisacodyl (DULCOLAX) 10 MG suppository Place 10 mg rectally as needed for moderate constipation.   fexofenadine (ALLEGRA) 180 MG  tablet Take 180 mg by mouth daily as needed for allergies or rhinitis.   FLUoxetine (PROZAC) 10 MG capsule Take 3 capsules (30 mg total) by mouth daily.   FLUoxetine (PROZAC) 20 MG tablet Take 1 tablet (20 mg total) by mouth daily.   gabapentin (NEURONTIN) 300 MG capsule Take 1 capsule (300 mg total) by mouth 3 (three) times daily.   insulin aspart (NOVOLOG) 100 UNIT/ML injection Inject 5 Units into the skin 3 (three) times daily before meals.   insulin aspart (NOVOLOG) 100 UNIT/ML injection Inject 0-6 Units into the skin 3 (three) times daily before meals. 0-6 Units, Subcutaneous, 3 times daily with meals, First dose tomorrow at 0800 Correction coverage: Very Sensitive (ESRD/Dialysis) CBG < 70: Implement Hypoglycemia Standing Orders and refer to Hypoglycemia Standing Orders sidebar report CBG 70 - 120: 0 units CBG 121 - 150: 0 units CBG 151 - 200: 1 unit CBG 201-250: 2 units CBG 251-300: 3 units CBG 301-350: 4 units CBG 351-400: 5 units CBG > 400: Give 6 units and call MD   insulin detemir (LEVEMIR) 100 UNIT/ML injection Inject 0.2 mLs (20 Units total) into the skin at  bedtime.   levothyroxine (SYNTHROID) 175 MCG tablet Take 1 tablet (175 mcg total) by mouth daily before breakfast.   Magnesium Hydroxide (MILK OF MAGNESIA PO) Take by mouth. give 30 cc Milk of Magnesium p.o. x 1 dose in 24 hours as needed   Multiple Vitamins-Minerals (OCUVITE PO) Take 1 tablet by mouth daily.   nystatin (MYCOSTATIN) 100000 UNIT/ML suspension Take 5 mLs by mouth 4 (four) times daily. For 2 weeks for oral thrush   pantoprazole (PROTONIX) 40 MG tablet Take 1 tablet (40 mg total) by mouth 2 (two) times daily.   potassium chloride (KLOR-CON) 10 MEQ tablet Take 1 tablet (10 mEq total) by mouth daily.   sennosides-docusate sodium (SENOKOT-S) 8.6-50 MG tablet Take 1 tablet by mouth 2 (two) times daily.   Sodium Phosphates (RA SALINE ENEMA RE) Place 1 Dose rectally as needed.   torsemide (DEMADEX) 10 MG tablet Take 1  tablet (10 mg total) by mouth daily.   traMADol (ULTRAM) 50 MG tablet Take 1 tablet (50 mg total) by mouth every 12 (twelve) hours as needed for severe pain.   Insulin Human (INSULIN PUMP) SOLN Inject 1 each into the skin 3 times daily with meals, bedtime and 2 AM. (Patient taking differently: Inject 1 each into the skin 3 times daily with meals, bedtime and 2 AM. Novolog)   [DISCONTINUED] cephALEXin (KEFLEX) 250 MG capsule Take 1 capsule (250 mg total) by mouth 4 (four) times daily.   [DISCONTINUED] Cholecalciferol (VITAMIN D) 50 MCG (2000 UT) tablet Take 2,000 Units by mouth daily.   [DISCONTINUED] Cranberry 425 MG CAPS Take 1 capsule by mouth daily.   [DISCONTINUED] omega-3 acid ethyl esters (LOVAZA) 1 g capsule Take 1 capsule (1 g total) by mouth daily.   No facility-administered encounter medications on file as of 10/21/2021.    Review of Systems  GENERAL: No change in appetite, no fatigue, no weight changes, no fever or chills  MOUTH and THROAT: Denies oral discomfort, gingival pain or bleeding RESPIRATORY: no cough, SOB, DOE, wheezing, hemoptysis CARDIAC: No chest pain or palpitations GI: No abdominal pain, diarrhea, constipation, heart burn, nausea or vomiting GU: Denies dysuria, frequency, hematuria, incontinence, or discharge NEUROLOGICAL: Denies dizziness, syncope, numbness, or headache PSYCHIATRIC: Denies feelings of depression or anxiety. No report of hallucinations, insomnia, paranoia, or agitation   Immunization History  Administered Date(s) Administered   Influenza, High Dose Seasonal PF 10/12/2017   Influenza,inj,Quad PF,6+ Mos 08/20/2013, 09/30/2014, 09/22/2015, 09/24/2016, 12/09/2018, 09/24/2019   Influenza-Unspecified 09/24/2017   PFIZER(Purple Top)SARS-COV-2 Vaccination 06/28/2020   Pneumococcal Conjugate-13 11/24/2016   Pneumococcal Polysaccharide-23 01/25/2012, 06/07/2018   Tdap 01/25/2012   Zoster Recombinat (Shingrix) 04/11/2017   Pertinent  Health  Maintenance Due  Topic Date Due   DEXA SCAN  12/29/2015   OPHTHALMOLOGY EXAM  12/28/2017   INFLUENZA VACCINE  07/25/2021   FOOT EXAM  12/10/2021   HEMOGLOBIN A1C  02/04/2022   MAMMOGRAM  04/06/2022   COLONOSCOPY (Pts 45-38yrs Insurance coverage will need to be confirmed)  09/09/2024   Fall Risk 10/10/2021 10/11/2021 10/11/2021 10/12/2021 10/13/2021  Falls in the past year? - - - - -  Was there an injury with Fall? - - - - -  Was there an injury with Fall? - - - - -  Fall Risk Category Calculator - - - - -  Fall Risk Category - - - - -  Patient Fall Risk Level High fall risk High fall risk High fall risk High fall risk High fall risk  Patient at  Risk for Falls Due to - - - - -  Patient at Risk for Falls Due to - - - - -  Fall risk Follow up - - - - -  Fall risk Follow up - - - - -     Vitals:   10/21/21 1244  BP: (!) 128/53  Pulse: 72  Resp: 19  Temp: (!) 97.5 F (36.4 C)  Weight: 249 lb 9.6 oz (113.2 kg)  Height: 5\' 7"  (1.702 m)   Body mass index is 39.09 kg/m.  Physical Exam  GENERAL APPEARANCE: Well nourished. In no acute distress. N morbidly obese  SKIN:  Skin is warm and dry.  MOUTH and THROAT: Lips are without lesions. Oral mucosa is moist and without lesions.  RESPIRATORY: Breathing is even & unlabored, BS CTAB CARDIAC:  no murmur,no extra heart sounds, no edema GI: Abdomen soft, normal BS, no masses, no tenderness NEUROLOGICAL: There is no tremor. Speech is clear. Left-sided weakness. Alert and oriented X 3. PSYCHIATRIC:  Affect and behavior are appropriate  Labs reviewed: Recent Labs    08/04/21 2303 08/05/21 0440 08/29/21 1044 10/10/21 0850 10/11/21 1336  NA  --    < > 138 135 137  K  --    < > 4.0 4.0 3.6  CL  --    < > 102 103 102  CO2  --    < > 25 24 27   GLUCOSE  --    < > 259* 234* 203*  BUN  --    < > 23 10 10   CREATININE  --    < > 1.36* 1.13* 1.17*  CALCIUM  --    < > 9.4 8.6* 8.4*  MG 1.8  --   --   --   --    < > = values in this  interval not displayed.   Recent Labs    08/04/21 1647 08/04/21 2303 08/11/21 0456  AST 27 21 20   ALT 24 22 19   ALKPHOS 91 78 78  BILITOT 0.8 0.5 0.5  PROT 6.7 6.0* 6.1*  ALBUMIN 3.2* 2.8* 2.8*   Recent Labs    08/11/21 0456 08/22/21 0617 08/29/21 1044 10/10/21 0850  WBC 4.5 3.6* 3.3* 5.2  NEUTROABS 2.1 1.4* 1.8  --   HGB 11.4* 11.4* 12.5 11.2*  HCT 36.3 36.4 40.2 36.6  MCV 89.4 90.3 91.8 92.4  PLT 179 175 143* 242   Lab Results  Component Value Date   TSH 1.610 08/04/2021   Lab Results  Component Value Date   HGBA1C 10.3 (H) 08/04/2021   Lab Results  Component Value Date   CHOL 102 08/04/2021   HDL 44 08/04/2021   LDLCALC 50 08/04/2021   LDLDIRECT 75 01/25/2012   TRIG 39 08/04/2021   CHOLHDL 2.3 08/04/2021    Significant Diagnostic Results in last 30 days:  DG Chest 2 View  Result Date: 10/10/2021 CLINICAL DATA:  Shortness of breath, fall after trying to come out of the bathroom EXAM: CHEST - 2 VIEW COMPARISON:  Chest radiograph 08/05/2021 FINDINGS: A left chest wall cardiac device is stable. A defibrillator pad overlies the chest. The heart is enlarged, unchanged. There is vascular congestion and possible mild pulmonary interstitial edema. There is no focal consolidation. There is no significant pleural effusion. There is no pneumothorax. There is no acute osseous abnormality. IMPRESSION: Mild cardiomegaly with vascular congestion and possible mild pulmonary interstitial edema. Electronically Signed   By: Valetta Mole M.D.   On: 10/10/2021  13:40   DG Knee 2 Views Left  Result Date: 10/10/2021 CLINICAL DATA:  Fall, pain EXAM: LEFT KNEE - 1-2 VIEW COMPARISON:  Left knee radiographs 07/06/2019 FINDINGS: There is no acute fracture or dislocation. Knee alignment is normal. There is mild medial and lateral tibiofemoral joint space narrowing with minimal associated osteophytosis. There is no effusion. The soft tissues are unremarkable. IMPRESSION: No acute fracture or  dislocation. Electronically Signed   By: Valetta Mole M.D.   On: 10/10/2021 16:21   CT Head Wo Contrast  Result Date: 10/10/2021 CLINICAL DATA:  Neurological deficit EXAM: CT HEAD WITHOUT CONTRAST TECHNIQUE: Contiguous axial images were obtained from the base of the skull through the vertex without intravenous contrast. COMPARISON:  08/04/2021 FINDINGS: Brain: No evidence of acute infarction, hemorrhage, hydrocephalus, extra-axial collection or mass lesion/mass effect. Chronic atrophic changes are noted. Vascular: No hyperdense vessel or unexpected calcification. Skull: Normal. Negative for fracture or focal lesion. Sinuses/Orbits: No acute finding. Other: None. IMPRESSION: Chronic atrophic changes without acute abnormality. Electronically Signed   By: Inez Catalina M.D.   On: 10/10/2021 19:56   CUP PACEART REMOTE DEVICE CHECK  Result Date: 10/06/2021 Scheduled remote reviewed. Normal device function.  There were three atrial fib arrhythmias detected, one was greater than 99 hours, AF burden is 28.8% of the time, ? [place], sent to triage. Next remote 91 days. Kathy Breach, RN, CCDS, [employer]   Assessment/Plan  1. Oral yeast infection -   Continue nystatin suspension for a total of 2 weeks -   Oral care daily  2. Type 1 diabetes mellitus with other circulatory complication (HCC) Lab Results  Component Value Date   HGBA1C 10.3 (H) 08/04/2021   -   Will increase Levemir from 20 units to 24 units at bedtime and NovoLog 5 units SQ TID for CBG >=200 -   Monitor CBGs  3. Essential hypertension -   BPs stable, continue Norvasc -   Monitor BPs  4. Paroxysmal atrial fibrillation (HCC) -   Rate controlled, continue Eliquis 5 mg twice a day for anticoagulation  5. History of CVA (cerebrovascular accident) -   Has left-sided weakness, continue PT and OT, for therapeutic strengthening exercises  6. Obstructive sleep apnea -Discussed importance of BiPAP , talked to niece over the phone  and she will bring her equipment back to facility so she can use at night   Family/ staff Communication:   Discussed plan of care with resident and charge nurse.  Labs/tests ordered:  None  Goals of care:   Short-term care   Durenda Age, DNP, MSN, FNP-BC [facility] [phone] (Monday-Friday 8:00 a.m. - 5:00 p.m.) [phone] (after hours)

## 2021-10-27 ENCOUNTER — Encounter: Payer: Self-pay | Admitting: Adult Health

## 2021-10-27 ENCOUNTER — Non-Acute Institutional Stay (SKILLED_NURSING_FACILITY): Payer: Medicare PPO | Admitting: Adult Health

## 2021-10-27 DIAGNOSIS — I480 Paroxysmal atrial fibrillation: Secondary | ICD-10-CM

## 2021-10-27 DIAGNOSIS — N390 Urinary tract infection, site not specified: Secondary | ICD-10-CM

## 2021-10-27 DIAGNOSIS — Z8673 Personal history of transient ischemic attack (TIA), and cerebral infarction without residual deficits: Secondary | ICD-10-CM

## 2021-10-27 DIAGNOSIS — I10 Essential (primary) hypertension: Secondary | ICD-10-CM

## 2021-10-27 DIAGNOSIS — K219 Gastro-esophageal reflux disease without esophagitis: Secondary | ICD-10-CM

## 2021-10-27 DIAGNOSIS — E890 Postprocedural hypothyroidism: Secondary | ICD-10-CM

## 2021-10-27 DIAGNOSIS — F339 Major depressive disorder, recurrent, unspecified: Secondary | ICD-10-CM

## 2021-10-27 DIAGNOSIS — W19XXXS Unspecified fall, sequela: Secondary | ICD-10-CM

## 2021-10-27 DIAGNOSIS — E1142 Type 2 diabetes mellitus with diabetic polyneuropathy: Secondary | ICD-10-CM | POA: Diagnosis not present

## 2021-10-27 DIAGNOSIS — E782 Mixed hyperlipidemia: Secondary | ICD-10-CM

## 2021-10-27 DIAGNOSIS — B370 Candidal stomatitis: Secondary | ICD-10-CM

## 2021-10-27 DIAGNOSIS — E1059 Type 1 diabetes mellitus with other circulatory complications: Secondary | ICD-10-CM

## 2021-10-27 DIAGNOSIS — K5901 Slow transit constipation: Secondary | ICD-10-CM | POA: Diagnosis not present

## 2021-10-27 DIAGNOSIS — G4733 Obstructive sleep apnea (adult) (pediatric): Secondary | ICD-10-CM

## 2021-10-27 DIAGNOSIS — E1069 Type 1 diabetes mellitus with other specified complication: Secondary | ICD-10-CM

## 2021-10-27 DIAGNOSIS — I5032 Chronic diastolic (congestive) heart failure: Secondary | ICD-10-CM

## 2021-10-27 MED ORDER — INSULIN ASPART 100 UNIT/ML IJ SOLN: 5.0000 [IU] | Freq: Three times a day (TID) | INTRAMUSCULAR | 11 refills | Status: DC

## 2021-10-27 MED ORDER — FEXOFENADINE HCL 180 MG PO TABS: 180.0000 mg | ORAL_TABLET | Freq: Every day | ORAL | 0 refills | Status: DC | PRN

## 2021-10-27 MED ORDER — GABAPENTIN 300 MG PO CAPS: 300.0000 mg | ORAL_CAPSULE | Freq: Three times a day (TID) | ORAL | 0 refills | Status: DC

## 2021-10-27 MED ORDER — LEVOTHYROXINE SODIUM 175 MCG PO TABS: 175.0000 ug | ORAL_TABLET | Freq: Every day | ORAL | 0 refills | Status: DC

## 2021-10-27 MED ORDER — ATORVASTATIN CALCIUM 80 MG PO TABS: 80.0000 mg | ORAL_TABLET | Freq: Every evening | ORAL | 0 refills | Status: DC

## 2021-10-27 MED ORDER — NYSTATIN 100000 UNIT/ML MT SUSP: 5.0000 mL | Freq: Four times a day (QID) | OROMUCOSAL | 0 refills | Status: AC

## 2021-10-27 MED ORDER — TRAMADOL HCL 50 MG PO TABS: 50.0000 mg | ORAL_TABLET | Freq: Two times a day (BID) | ORAL | 0 refills | Status: AC | PRN

## 2021-10-27 MED ORDER — POTASSIUM CHLORIDE CRYS ER 10 MEQ PO TBCR: 10.0000 meq | EXTENDED_RELEASE_TABLET | Freq: Every day | ORAL | 0 refills | Status: DC

## 2021-10-27 MED ORDER — INSULIN DETEMIR 100 UNIT/ML ~~LOC~~ SOLN: 24.0000 [IU] | Freq: Every day | SUBCUTANEOUS | 0 refills | Status: DC

## 2021-10-27 MED ORDER — SENNA-DOCUSATE SODIUM 8.6-50 MG PO TABS: 1.0000 | ORAL_TABLET | Freq: Two times a day (BID) | ORAL | 2 refills | Status: DC

## 2021-10-27 MED ORDER — FLUOXETINE HCL 10 MG PO CAPS: 30.0000 mg | ORAL_CAPSULE | Freq: Every day | ORAL | 0 refills | Status: DC

## 2021-10-27 MED ORDER — PANTOPRAZOLE SODIUM 40 MG PO TBEC: 40.0000 mg | DELAYED_RELEASE_TABLET | Freq: Two times a day (BID) | ORAL | 0 refills | Status: AC

## 2021-10-27 MED ORDER — TORSEMIDE 10 MG PO TABS: 10.0000 mg | ORAL_TABLET | Freq: Every day | ORAL | 0 refills | Status: DC

## 2021-10-27 MED ORDER — AMLODIPINE BESYLATE 10 MG PO TABS: 10.0000 mg | ORAL_TABLET | Freq: Every day | ORAL | 0 refills | Status: DC

## 2021-10-27 MED ORDER — APIXABAN 5 MG PO TABS: 5.0000 mg | ORAL_TABLET | Freq: Two times a day (BID) | ORAL | 0 refills | Status: DC

## 2021-10-27 MED ORDER — POLYETHYLENE GLYCOL 3350 17 GM/SCOOP PO POWD: 17.0000 g | Freq: Every day | ORAL | 0 refills | Status: DC

## 2021-10-27 NOTE — Progress Notes (Signed)
Location:  [place] Room Number: 211 A Place of Service:  SNF (31) Provider:  Durenda Age, DNP, FNP-BC  Patient Care Team: Eulis Foster, MD as PCP - General (Family Medicine) Jerline Pain, MD as PCP - Cardiology (Cardiology) Constance Haw, MD as PCP - Electrophysiology (Cardiology) Kennith Center, RD as Dietitian (Family Medicine) Maurine Cane, LCSW as Social Worker (Licensed Clinical Social Worker) Lazaro Arms, RN as [place] Management  Extended Emergency Contact Information Primary Emergency Contact: [place] Phone: [phone] Mobile Phone: [phone] Relation: Sister Secondary Emergency Contact: Lee,Candance Mobile Phone: [phone] Relation: Niece  Code Status:   Full Code  Goals of care: Advanced Directive information Advanced Directives 10/21/2021  Does Patient Have a Medical Advance Directive? No  Type of Advance Directive -  Does patient want to make changes to medical advance directive? -  Copy of [place] in Chart? -  Would patient like information on creating a medical advance directive? -     Chief Complaint  Patient presents with   Discharge Note    For discharge home on 10/27/21 from [facility]    HPI:  Pt is a 70 y.o. female who is for discharge home today, 10/27/2021, and will be followed up by home health PT, OT and Nursing.  She was [facility] To [facility] on 10/12/2021 post ED stay 10/10/2021 to 10/12/2021.  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  She fell at home while trying to get to the bathroom.  EMS came to her home and put her back in bed.  She typically uses a wheelchair to get around but when she goes to the bathroom, she has to stand up and walk from the  doorway to the commode.  She reported getting her feet tangled up causing her to fall, injuring her low knee.  Imaging and lab work were unremarkable.  She had signs of urinary tract infection and culture showed 60,000 colonies/mL, Enterococcus faecalis.  She denied vomiting, fever, chills, cough or shortness of breath.  She was treated with Keflex for possible UTI.  She noticed increasing pain that made it hard to move the left knee.  She refused to leave ED stating that she needed further testing and treatment.  She asked for PT consultation.  CT head showed no evidence of acute infarction, hemorrhage nor hydrocephalus.  Chronic atrophic changes without acute abnormality.  PT noted that she desaturates while walking.  Last cardiac echo done August 2020 EF greater than 55%.  She continues to have whitish coating on her tongue and will need continued use of nystatin suspension.  She is now on room air without shortness of breath.  Patient was admitted to this facility for short-term rehabilitation after the patient's recent hospitalization.  Patient has completed SNF rehabilitation and therapy has cleared the patient for discharge.   Past Medical History:  Diagnosis Date   Abnormal echocardiogram    Anemia    Arthritis    BPPV (benign paroxysmal positional vertigo), right 06/13/2017   Bradycardia 03/07/2020   Cerebrovascular accident (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 ([place]) 10/29/2015   CHB (complete heart block) ([place]) 03/08/2020   CHF (congestive heart failure) ([place])    Closed fracture of left proximal humerus 07/08/2019   Decreased range of motion of left shoulder 09/03/2020   Depression  Dizzy 03/07/2020   bradycardia   Edentulous 09/03/2020   GERD (gastroesophageal reflux disease)    Hemiparesis affecting left side as late effect of cerebrovascular accident (CVA) ([place]) 03/08/2020   Hemorrhoids    History of Falls with injury 03/08/2020   Fall resulting in left humeral fracture    Hyperlipidemia    Hypertension    Hypothyroidism    Intracranial vascular stenosis    Lacunar infarction [facility])    Brain MRI 07/2021 multiple small remote pontine lacunar infarcts   Near syncope 03/08/2020   Paroxysmal atrial fibrillation (HCC)    Presence of permanent cardiac pacemaker    Proteinuria 03/08/2020   Seasonal allergies    Stroke [facility])    Thyroid cancer ([place])    per pt S/p Total Thyroidectomy with Radioactive Iodine Therapy   Type 1 diabetes mellitus ([place])    Past Surgical History:  Procedure Laterality Date   ABDOMINAL ANGIOGRAM N/A 05/14/2012   Procedure: ABDOMINAL ANGIOGRAM;  Surgeon: Laverda Page, MD;  Location: [facility] CATH LAB;  Service: Cardiovascular;  Laterality: N/A;   ABDOMINAL HYSTERECTOMY  1995   partial   ANKLE FRACTURE SURGERY Right    CARDIAC CATHETERIZATION     with coronary angiogram   cataract  Bilateral 2018   COLONOSCOPY N/A 09/09/2014   Procedure: COLONOSCOPY;  Surgeon: Gatha Mayer, MD;  Location: [place];  Service: Endoscopy;  Laterality: N/A;   ESOPHAGOGASTRODUODENOSCOPY (EGD) WITH PROPOFOL N/A 12/03/2018   Procedure: ESOPHAGOGASTRODUODENOSCOPY (EGD) WITH PROPOFOL;  Surgeon: Doran Stabler, MD;  Location: [place];  Service: Gastroenterology;  Laterality: N/A;   LEFT AND RIGHT HEART CATHETERIZATION WITH CORONARY ANGIOGRAM N/A 05/14/2012   Procedure: LEFT AND RIGHT HEART CATHETERIZATION WITH CORONARY ANGIOGRAM;  Surgeon: Laverda Page, MD;  Location: [facility] CATH LAB;  Service: Cardiovascular;  Laterality: N/A;   LOOP RECORDER INSERTION N/A 12/23/2018   Procedure: LOOP RECORDER INSERTION;  Surgeon: Constance Haw, MD;  Location: [place] CV LAB;  Service: Cardiovascular;  Laterality: N/A;   LOOP RECORDER REMOVAL N/A 06/25/2020   Procedure: LOOP RECORDER REMOVAL;  Surgeon: Constance Haw, MD;  Location: [place] CV LAB;  Service: Cardiovascular;  Laterality: N/A;   MINI METER   09/08/2014   ELECTRONIC INSULIN  PUMP   PACEMAKER IMPLANT N/A 06/25/2020   Procedure: PACEMAKER IMPLANT;  Surgeon: Constance Haw, MD;  Location: [place] CV LAB;  Service: Cardiovascular;  Laterality: N/A;   TEE WITHOUT CARDIOVERSION N/A 10/16/2018   Procedure: TRANSESOPHAGEAL ECHOCARDIOGRAM (TEE);  Surgeon: Buford Dresser, MD;  Location: [facility] ENDOSCOPY;  Service: Cardiovascular;  Laterality: N/A;   THYROIDECTOMY  2006   for thyroid cancer    Allergies  Allergen Reactions   Latex Rash    Outpatient Encounter Medications as of 10/27/2021  Medication Sig   acetaminophen (TYLENOL) 325 MG tablet Take 2 tablets (650 mg total) by mouth every 6 (six) hours as needed for moderate pain.   amLODipine (NORVASC) 10 MG tablet Take 1 tablet (10 mg total) by mouth daily.   apixaban (ELIQUIS) 5 MG TABS tablet Take 1 tablet (5 mg total) by mouth 2 (two) times daily.   atorvastatin (LIPITOR) 80 MG tablet Take 1 tablet (80 mg total) by mouth every evening.   bisacodyl (DULCOLAX) 10 MG suppository Place 10 mg rectally as needed for moderate constipation.   fexofenadine (ALLEGRA) 180 MG tablet Take 180 mg by mouth daily as needed for allergies or rhinitis.   FLUoxetine (PROZAC) 10 MG capsule Take 3 capsules (  30 mg total) by mouth daily.   gabapentin (NEURONTIN) 300 MG capsule Take 1 capsule (300 mg total) by mouth 3 (three) times daily.   insulin aspart (NOVOLOG) 100 UNIT/ML injection Inject 5 Units into the skin 3 (three) times daily before meals.   insulin detemir (LEVEMIR) 100 UNIT/ML injection Inject 0.2 mLs (20 Units total) into the skin at bedtime.   Insulin Human (INSULIN PUMP) SOLN Inject 1 each into the skin 3 times daily with meals, bedtime and 2 AM. (Patient taking differently: Inject 1 each into the skin 3 times daily with meals, bedtime and 2 AM. Novolog)   levothyroxine (SYNTHROID) 175 MCG tablet Take 1 tablet (175 mcg total) by mouth daily before breakfast.   Magnesium Hydroxide (MILK OF MAGNESIA PO) Take by  mouth. give 30 cc Milk of Magnesium p.o. x 1 dose in 24 hours as needed   Multiple Vitamins-Minerals (OCUVITE PO) Take 1 tablet by mouth daily.   nystatin (MYCOSTATIN) 100000 UNIT/ML suspension Take 5 mLs by mouth 4 (four) times daily. For 2 weeks for oral thrush   pantoprazole (PROTONIX) 40 MG tablet Take 1 tablet (40 mg total) by mouth 2 (two) times daily.   potassium chloride (KLOR-CON) 10 MEQ tablet Take 1 tablet (10 mEq total) by mouth daily.   sennosides-docusate sodium (SENOKOT-S) 8.6-50 MG tablet Take 1 tablet by mouth 2 (two) times daily.   Sodium Phosphates (RA SALINE ENEMA RE) Place 1 Dose rectally as needed.   torsemide (DEMADEX) 10 MG tablet Take 1 tablet (10 mg total) by mouth daily.   traMADol (ULTRAM) 50 MG tablet Take 1 tablet (50 mg total) by mouth every 12 (twelve) hours as needed for severe pain.   [DISCONTINUED] FLUoxetine (PROZAC) 20 MG tablet Take 1 tablet (20 mg total) by mouth daily.   [DISCONTINUED] insulin aspart (NOVOLOG) 100 UNIT/ML injection Inject 0-6 Units into the skin 3 (three) times daily before meals. 0-6 Units, Subcutaneous, 3 times daily with meals, First dose tomorrow at 0800 Correction coverage: Very Sensitive (ESRD/Dialysis) CBG < 70: Implement Hypoglycemia Standing Orders and refer to Hypoglycemia Standing Orders sidebar report CBG 70 - 120: 0 units CBG 121 - 150: 0 units CBG 151 - 200: 1 unit CBG 201-250: 2 units CBG 251-300: 3 units CBG 301-350: 4 units CBG 351-400: 5 units CBG > 400: Give 6 units and call MD   No facility-administered encounter medications on file as of 10/27/2021.    Review of Systems  GENERAL: No change in appetite, no fatigue, no weight changes, no fever, chills MOUTH and THROAT: Denies oral discomfort, gingival pain or bleeding RESPIRATORY: no cough, SOB, DOE, wheezing, hemoptysis CARDIAC: No chest pain, edema or palpitations GI: No abdominal pain, diarrhea, constipation, heart burn, nausea or vomiting GU: Denies  dysuria, frequency, hematuria, incontinence, or discharge NEUROLOGICAL: Denies dizziness, syncope, numbness, or headache PSYCHIATRIC: Denies feelings of depression or anxiety. No report of hallucinations, insomnia, paranoia, or agitation   Immunization History  Administered Date(s) Administered   Influenza, High Dose Seasonal PF 10/12/2017   Influenza,inj,Quad PF,6+ Mos 08/20/2013, 09/30/2014, 09/22/2015, 09/24/2016, 12/09/2018, 09/24/2019   Influenza-Unspecified 09/24/2017   PFIZER(Purple Top)SARS-COV-2 Vaccination 06/28/2020   Pneumococcal Conjugate-13 11/24/2016   Pneumococcal Polysaccharide-23 01/25/2012, 06/07/2018   Tdap 01/25/2012   Zoster Recombinat (Shingrix) 04/11/2017   Pertinent  Health Maintenance Due  Topic Date Due   DEXA SCAN  12/29/2015   OPHTHALMOLOGY EXAM  12/28/2017   INFLUENZA VACCINE  07/25/2021   FOOT EXAM  12/10/2021   HEMOGLOBIN A1C  02/04/2022   MAMMOGRAM  04/06/2022   COLONOSCOPY (Pts 45-65yrs Insurance coverage will need to be confirmed)  09/09/2024   Fall Risk 10/10/2021 10/11/2021 10/11/2021 10/12/2021 10/13/2021  Falls in the past year? - - - - -  Was there an injury with Fall? - - - - -  Was there an injury with Fall? - - - - -  Fall Risk Category Calculator - - - - -  Fall Risk Category - - - - -  Patient Fall Risk Level High fall risk High fall risk High fall risk High fall risk High fall risk  Patient at Risk for Falls Due to - - - - -  Patient at Risk for Falls Due to - - - - -  Fall risk Follow up - - - - -  Fall risk Follow up - - - - -     Vitals:   10/27/21 1608  BP: (!) 141/84  Pulse: 67  Resp: 18  Temp: (!) 97.5 F (36.4 C)  Weight: 250 lb 12.8 oz (113.8 kg)  Height: 5\' 7"  (1.702 m)   Body mass index is 39.28 kg/m.  Physical Exam  GENERAL APPEARANCE: Well nourished. In no acute distress.  Morbidly obese  SKIN:  Skin is warm and dry.  MOUTH and THROAT: Lips are without lesions. Oral mucosa is moist and without lesions.   RESPIRATORY: Breathing is even & unlabored, BS CTAB CARDIAC:  no extra heart sounds, no edema GI: Abdomen soft, normal BS, no masses, no tenderness NEUROLOGICAL: There is no tremor. Speech is clear. Alert and oriented X 3. PSYCHIATRIC:  Affect and behavior are appropriate  Labs reviewed: Recent Labs    08/04/21 2303 08/05/21 0440 08/29/21 1044 10/10/21 0850 10/11/21 1336  NA  --    < > 138 135 137  K  --    < > 4.0 4.0 3.6  CL  --    < > 102 103 102  CO2  --    < > 25 24 27   GLUCOSE  --    < > 259* 234* 203*  BUN  --    < > 23 10 10   CREATININE  --    < > 1.36* 1.13* 1.17*  CALCIUM  --    < > 9.4 8.6* 8.4*  MG 1.8  --   --   --   --    < > = values in this interval not displayed.   Recent Labs    08/04/21 1647 08/04/21 2303 08/11/21 0456  AST 27 21 20   ALT 24 22 19   ALKPHOS 91 78 78  BILITOT 0.8 0.5 0.5  PROT 6.7 6.0* 6.1*  ALBUMIN 3.2* 2.8* 2.8*   Recent Labs    08/11/21 0456 08/22/21 0617 08/29/21 1044 10/10/21 0850  WBC 4.5 3.6* 3.3* 5.2  NEUTROABS 2.1 1.4* 1.8  --   HGB 11.4* 11.4* 12.5 11.2*  HCT 36.3 36.4 40.2 36.6  MCV 89.4 90.3 91.8 92.4  PLT 179 175 143* 242   Lab Results  Component Value Date   TSH 1.610 08/04/2021   Lab Results  Component Value Date   HGBA1C 10.3 (H) 08/04/2021   Lab Results  Component Value Date   CHOL 102 08/04/2021   HDL 44 08/04/2021   LDLCALC 50 08/04/2021   LDLDIRECT 75 01/25/2012   TRIG 39 08/04/2021   CHOLHDL 2.3 08/04/2021    Significant Diagnostic Results in last 30 days:  DG Chest 2 View  Result  Date: 10/10/2021 CLINICAL DATA:  Shortness of breath, fall after trying to come out of the bathroom EXAM: CHEST - 2 VIEW COMPARISON:  Chest radiograph 08/05/2021 FINDINGS: A left chest wall cardiac device is stable. A defibrillator pad overlies the chest. The heart is enlarged, unchanged. There is vascular congestion and possible mild pulmonary interstitial edema. There is no focal consolidation. There is no  significant pleural effusion. There is no pneumothorax. There is no acute osseous abnormality. IMPRESSION: Mild cardiomegaly with vascular congestion and possible mild pulmonary interstitial edema. Electronically Signed   By: Valetta Mole M.D.   On: 10/10/2021 13:40   DG Knee 2 Views Left  Result Date: 10/10/2021 CLINICAL DATA:  Fall, pain EXAM: LEFT KNEE - 1-2 VIEW COMPARISON:  Left knee radiographs 07/06/2019 FINDINGS: There is no acute fracture or dislocation. Knee alignment is normal. There is mild medial and lateral tibiofemoral joint space narrowing with minimal associated osteophytosis. There is no effusion. The soft tissues are unremarkable. IMPRESSION: No acute fracture or dislocation. Electronically Signed   By: Valetta Mole M.D.   On: 10/10/2021 16:21   CT Head Wo Contrast  Result Date: 10/10/2021 CLINICAL DATA:  Neurological deficit EXAM: CT HEAD WITHOUT CONTRAST TECHNIQUE: Contiguous axial images were obtained from the base of the skull through the vertex without intravenous contrast. COMPARISON:  08/04/2021 FINDINGS: Brain: No evidence of acute infarction, hemorrhage, hydrocephalus, extra-axial collection or mass lesion/mass effect. Chronic atrophic changes are noted. Vascular: No hyperdense vessel or unexpected calcification. Skull: Normal. Negative for fracture or focal lesion. Sinuses/Orbits: No acute finding. Other: None. IMPRESSION: Chronic atrophic changes without acute abnormality. Electronically Signed   By: Inez Catalina M.D.   On: 10/10/2021 19:56   CUP PACEART REMOTE DEVICE CHECK  Result Date: 10/06/2021 Scheduled remote reviewed. Normal device function.  There were three atrial fib arrhythmias detected, one was greater than 99 hours, AF burden is 28.8% of the time, ? [place], sent to triage. Next remote 91 days. Kathy Breach, RN, CCDS, [employer]   Assessment/Plan  1. Fall, sequela -   Imaging was negative for fracture -   For home health PT and OT, for therapeutic  strengthening exercises - traMADol (ULTRAM) 50 MG tablet; Take 1 tablet (50 mg total) by mouth every 12 (twelve) hours as needed for up to 5 days for severe pain.  Dispense: 10 tablet; Refill: 0  2. Type 1 diabetes mellitus with other circulatory complication ([place]) -   Will have home health nurse to follow her at home - insulin detemir (LEVEMIR) 100 UNIT/ML injection; Inject 0.24 mLs (24 Units total) into the skin at bedtime.  Dispense: 10 mL; Refill: 0 - insulin aspart (NOVOLOG) 100 UNIT/ML injection; Inject 5 Units into the skin 3 (three) times daily before meals. For blood sugar greater than or equal to 200  Dispense: 10 mL; Refill: 11  3. Essential hypertension - amLODipine (NORVASC) 10 MG tablet; Take 1 tablet (10 mg total) by mouth daily.  Dispense: 30 tablet; Refill: 0  4. Oral yeast infection - nystatin (MYCOSTATIN) 100000 UNIT/ML suspension; Take 5 mLs (500,000 Units total) by mouth 4 (four) times daily for 14 days. For 2 weeks for oral thrush  Dispense: 60 mL; Refill: 0  5. History of CVA (cerebrovascular accident) -Has left-sided weakness - Continue Eliquis and Lipitor  6. Paroxysmal atrial fibrillation (HCC) - apixaban (ELIQUIS) 5 MG TABS tablet; Take 1 tablet (5 mg total) by mouth 2 (two) times daily.  Dispense: 60 tablet; Refill: 0  7. Urinary tract infection without hematuria, site unspecified -   Completed antibiotics  8. Diabetic peripheral neuropathy (HCC) -    gabapentin (NEURONTIN) 300 MG capsule; Take 1 capsule (300 mg total) by mouth 3 (three) times daily.  Dispense: 90 capsule; Refill: 0  9. Slow transit constipation - sennosides-docusate sodium (SENOKOT-S) 8.6-50 MG tablet; Take 1 tablet by mouth 2 (two) times daily.  Dispense: 60 tablet; Refill: 2 - polyethylene glycol powder (GLYCOLAX/MIRALAX) 17 GM/SCOOP powder; Take 17 g by mouth daily.  Dispense: 507 g; Refill: 0  10. Major depression, recurrent, chronic (HCC) - FLUoxetine (PROZAC) 10 MG capsule; Take 3  capsules (30 mg total) by mouth daily.  Dispense: 90 capsule; Refill: 0  11. Gastroesophageal reflux disease without esophagitis - pantoprazole (PROTONIX) 40 MG tablet; Take 1 tablet (40 mg total) by mouth 2 (two) times daily.  Dispense: 60 tablet; Refill: 0  12. Hypothyroidism, postsurgical - levothyroxine (SYNTHROID) 175 MCG tablet; Take 1 tablet (175 mcg total) by mouth daily before breakfast.  Dispense: 30 tablet; Refill: 0  13. 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 Chronic diastolic congestive heart failure (HCC) - potassium chloride (KLOR-CON) 10 MEQ tablet; Take 1 tablet (10 mEq total) by mouth daily.  Dispense: 30 tablet; Refill: 0 - torsemide (DEMADEX) 10 MG tablet; Take 1 tablet (10 mg total) by mouth daily.  Dispense: 30 tablet; Refill: 0  15. Obstructive sleep apnea -   Continue BiPAP at bedtime    I have filled out patient's discharge paperwork and e-prescribed medications.  Patient will receive home health PT, OT and Nursing.  DME provided:  None  Total discharge time: Greater than 30 minutes Greater than 50% was spent in counseling and coordination of care.   Discharge time involved coordination of the discharge process with social worker, nursing staff and therapy department. Medical justification for home health services verified.   Durenda Age, DNP, MSN, FNP-BC [facility] [phone] (Monday-Friday 8:00 a.m. - 5:00 p.m.) [phone] (after hours)

## 2021-10-28 ENCOUNTER — Other Ambulatory Visit: Payer: Self-pay

## 2021-10-28 ENCOUNTER — Encounter: Payer: Medicare PPO | Attending: Physical Medicine & Rehabilitation | Admitting: Physical Medicine & Rehabilitation

## 2021-10-28 ENCOUNTER — Encounter: Payer: Self-pay | Admitting: Physical Medicine & Rehabilitation

## 2021-10-28 VITALS — BP 126/71 | HR 67

## 2021-10-28 DIAGNOSIS — I639 Cerebral infarction, unspecified: Secondary | ICD-10-CM | POA: Insufficient documentation

## 2021-10-28 NOTE — Patient Instructions (Signed)
Rec lace up ankle brace to provide stability

## 2021-10-28 NOTE — Progress Notes (Signed)
Subjective:    Patient ID: [name], female    DOB: 04-15-1951, 70 y.o.   MRN: [id number] 70 y.o. 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  Lives with relatives multilevel home.  Ambulates short distances with a Rollator.  Granddaughter assists with ADLs.  Presented 08/04/2021 with increasing left-sided weakness and worsening dysarthria.  CT/MRI showed small acute infarct in the left pons no significant edema or mass-effect.  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  Remote pontine microhemorrhage.  Echocardiogram with ejection fraction of 60 to 65% no wall motion abnormalities.  Admission chemistries unremarkable.  Patient did not receive tPA.  Neurology follow-up maintained on Eliquis as prior to admission.  She did have an insulin pump utilized for diabetes.  Wound care nurse follow-up 08/05/2021 for left heel and right shin wound with skin care as directed.  Tolerating a regular consistency diet.  Therapy evaluations completed due to patient's left-sided weakness dysarthric speech was admitted for a comprehensive rehab program  Admit date: 08/10/2021 Discharge date: 08/30/2021 HPI  Remote RIght Pontine hemorrhage, more recent left pontine infarct  Patient admitted to [facility] on 10/12/2021 following an ED stay for a fall.  She had a 3-day ED length of stay.  She had a UTI treated in the ED.  CT of the head showed no new infarct.  The patient had left knee pain but no evidence of fracture on x-rays.  Unable to dress self  LEFT KNEE - 1-2 VIEW   COMPARISON:  Left knee radiographs 07/06/2019    FINDINGS: There is no acute fracture or dislocation. Knee alignment is normal. There is mild medial and lateral tibiofemoral joint space narrowing with minimal associated osteophytosis. There is no effusion. The soft tissues are unremarkable.   IMPRESSION: No acute fracture or dislocation.     Electronically Signed   By: Valetta Mole M.D.   On: 10/10/2021 16:21  CLINICAL DATA:  Neuro deficit, acute, stroke suspected   EXAM: MRI HEAD WITHOUT AND WITH CONTRAST   TECHNIQUE: Multiplanar, multiecho pulse sequences of the brain and surrounding structures were obtained without and with intravenous contrast.   CONTRAST:  1mL GADAVIST GADOBUTROL 1 MMOL/ML IV SOLN   COMPARISON:  CT head 08/04/2021.  MRI October 14, 2018.   FINDINGS: Brain: Small acute infarct in the left pons. No significant edema or mass effect. Prominent T2/FLAIR hyperintensity of the pons, likely representing severe microvascular ischemic change with multiple small remote pontine lacunar infarcts. Additional T2/FLAIR hyperintensity of the left middle cerebellar peduncle, likely representing Wallerian type degeneration with focal t2 hyperintensity in this region likely an additional remote lacunar infarct. Milder supratentorial chronic microvascular ischemic disease with scattered white matter T2 hyperintensities. No hydrocephalus. No acute hemorrhage. No extra-axial fluid collections. No midline shift. No abnormal enhancement. Punctate focus of susceptibility artifact in the right pons, compatible with prior microhemorrhage.   Vascular: Major arterial flow voids are maintained at the skull base.   Skull and upper cervical spine: Normal marrow signal.   Sinuses/Orbits: Clear visualized sinuses.  Unremarkable orbits.   Other: No sizable mastoid effusions.   IMPRESSION: 1. Small acute infarct in the left pons. No significant edema or mass effect. 2. Severe chronic microvascular ischemic  disease  involving the pons and left middle cerebellar peduncle with many remote lacunar infarcts, progressed from 2019. Milder chronic microvascular ischemic disease elsewhere. 3. Remote pontine microhemorrhage, potentially hypertensive.     Electronically Signed   By: Margaretha Sheffield M.D.   On: 08/05/2021 14:33 Pain Inventory Average Pain 10 Pain Right Now 7 My pain is intermittent and aching  In the last 24 hours, has pain interfered with the following? General activity 8 Relation with others 0 Enjoyment of life 8 What TIME of day is your pain at its worst? varies Sleep (in general) Fair  Pain is worse with: walking, bending, sitting, standing, and some activites Pain improves with: therapy/exercise and medication Relief from Meds: 2  Family History  Problem Relation Age of Onset   Heart disease Mother    Hyperlipidemia Mother    Hypertension Mother    Renal Disease Mother        insufficiency   Heart attack Mother        in his 29's   Early death Father        Head Injury at Work   Hyperlipidemia Father    Hypertension Father    Stroke Son    Diabetes Maternal Aunt    Prostate cancer Paternal Uncle    Heart disease Maternal Aunt    Heart failure Maternal Grandfather    Heart disease Maternal Grandfather    Heart attack Maternal Grandfather        Died in his 21's   Breast cancer Neg Hx    Social History   Socioeconomic History   Marital status: Divorced    Spouse name: Not on file   Number of children: 1   Years of education: Not on file   Highest education level: Not on file  Occupational History   Occupation: retired Pharmacist, hospital  Tobacco Use   Smoking status: Never   Smokeless tobacco: Never  Vaping Use   Vaping Use: Never used  Substance and Sexual Activity   Alcohol use: No   Drug use: No   Sexual activity: Not on file  Other Topics Concern   Not on file  Social History Narrative   Retired Pharmacist, hospital, high school teacher taught mass. One son, helping  to care for her granddaughter. No caffeine. Updated 07/20/2014.   Social Determinants of Health   Financial Resource Strain: Not on file  Food Insecurity: No Food Insecurity   Worried About Charity fundraiser in the Last Year: Never true   Ran Out of Food in the Last Year: Never true  Transportation Needs: Not on file  Physical Activity: Not on file  Stress: Not on file  Social Connections: Not on file   Past Surgical History:  Procedure Laterality Date   ABDOMINAL ANGIOGRAM N/A 05/14/2012   Procedure: ABDOMINAL ANGIOGRAM;  Surgeon: Laverda Page, MD;  Location: [facility] CATH LAB;  Service: Cardiovascular;  Laterality: N/A;   ABDOMINAL HYSTERECTOMY  1995   partial   ANKLE FRACTURE SURGERY Right    CARDIAC CATHETERIZATION     with coronary angiogram   cataract  Bilateral 2018   COLONOSCOPY N/A 09/09/2014   Procedure: COLONOSCOPY;  Surgeon: Gatha Mayer, MD;  Location: [place];  Service: Endoscopy;  Laterality: N/A;   ESOPHAGOGASTRODUODENOSCOPY (EGD) WITH PROPOFOL N/A 12/03/2018   Procedure: ESOPHAGOGASTRODUODENOSCOPY (EGD) WITH PROPOFOL;  Surgeon: Doran Stabler, MD;  Location: [place];  Service: Gastroenterology;  Laterality: N/A;   LEFT AND RIGHT HEART CATHETERIZATION WITH CORONARY  ANGIOGRAM N/A 05/14/2012   Procedure: LEFT AND RIGHT HEART CATHETERIZATION WITH CORONARY ANGIOGRAM;  Surgeon: Laverda Page, MD;  Location: [facility] CATH LAB;  Service: Cardiovascular;  Laterality: N/A;   LOOP RECORDER INSERTION N/A 12/23/2018   Procedure: LOOP RECORDER INSERTION;  Surgeon: Constance Haw, MD;  Location: [place] CV LAB;  Service: Cardiovascular;  Laterality: N/A;   LOOP RECORDER REMOVAL N/A 06/25/2020   Procedure: LOOP RECORDER REMOVAL;  Surgeon: Constance Haw, MD;  Location: [place] CV LAB;  Service: Cardiovascular;  Laterality: N/A;   MINI METER   09/08/2014   ELECTRONIC INSULIN PUMP   PACEMAKER IMPLANT N/A 06/25/2020   Procedure: PACEMAKER IMPLANT;   Surgeon: Constance Haw, MD;  Location: [place] CV LAB;  Service: Cardiovascular;  Laterality: N/A;   TEE WITHOUT CARDIOVERSION N/A 10/16/2018   Procedure: TRANSESOPHAGEAL ECHOCARDIOGRAM (TEE);  Surgeon: Buford Dresser, MD;  Location: [facility] ENDOSCOPY;  Service: Cardiovascular;  Laterality: N/A;   THYROIDECTOMY  2006   for thyroid cancer   Past Surgical History:  Procedure Laterality Date   ABDOMINAL ANGIOGRAM N/A 05/14/2012   Procedure: ABDOMINAL ANGIOGRAM;  Surgeon: Laverda Page, MD;  Location[facility] CATH LAB;  Service: Cardiovascular;  Laterality: N/A;   ABDOMINAL HYSTERECTOMY  1995   partial   ANKLE FRACTURE SURGERY Right    CARDIAC CATHETERIZATION     with coronary angiogram   cataract  Bilateral 2018   COLONOSCOPY N/A 09/09/2014   Procedure: COLONOSCOPY;  Surgeon: Gatha Mayer, MD;  Location: [place];  Service: Endoscopy;  Laterality: N/A;   ESOPHAGOGASTRODUODENOSCOPY (EGD) WITH PROPOFOL N/A 12/03/2018   Procedure: ESOPHAGOGASTRODUODENOSCOPY (EGD) WITH PROPOFOL;  Surgeon: Doran Stabler, MD;  Location: [place];  Service: Gastroenterology;  Laterality: N/A;   LEFT AND RIGHT HEART CATHETERIZATION WITH CORONARY ANGIOGRAM N/A 05/14/2012   Procedure: LEFT AND RIGHT HEART CATHETERIZATION WITH CORONARY ANGIOGRAM;  Surgeon: Laverda Page, MD;  Location: [facility] CATH LAB;  Service: Cardiovascular;  Laterality: N/A;   LOOP RECORDER INSERTION N/A 12/23/2018   Procedure: LOOP RECORDER INSERTION;  Surgeon: Constance Haw, MD;  Location: [place] CV LAB;  Service: Cardiovascular;  Laterality: N/A;   LOOP RECORDER REMOVAL N/A 06/25/2020   Procedure: LOOP RECORDER REMOVAL;  Surgeon: Constance Haw, MD;  Location: [place] CV LAB;  Service: Cardiovascular;  Laterality: N/A;   MINI METER   09/08/2014   ELECTRONIC INSULIN PUMP   PACEMAKER IMPLANT N/A 06/25/2020   Procedure: PACEMAKER IMPLANT;  Surgeon: Constance Haw, MD;  Location: [place]  CV LAB;  Service: Cardiovascular;  Laterality: N/A;   TEE WITHOUT CARDIOVERSION N/A 10/16/2018   Procedure: TRANSESOPHAGEAL ECHOCARDIOGRAM (TEE);  Surgeon: Buford Dresser, MD;  Location: [facility] ENDOSCOPY;  Service: Cardiovascular;  Laterality: N/A;   THYROIDECTOMY  2006   for thyroid cancer   Past Medical History:  Diagnosis Date   Abnormal echocardiogram    Anemia    Arthritis    BPPV (benign paroxysmal positional vertigo), right 06/13/2017   Bradycardia 03/07/2020   Cerebrovascular accident (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 ([place]) 10/29/2015   CHB (complete heart block) ([place]) 03/08/2020   CHF (congestive heart failure) ([place])    Closed fracture of left proximal humerus 07/08/2019   Decreased range of motion of left shoulder 09/03/2020   Depression    Dizzy 03/07/2020   bradycardia   Edentulous 09/03/2020   GERD (gastroesophageal reflux disease)    Hemiparesis affecting left side as late effect of cerebrovascular accident (CVA) ([place])  03/08/2020   Hemorrhoids    History of Falls with injury 03/08/2020   Fall resulting in left humeral fracture   Hyperlipidemia    Hypertension    Hypothyroidism    Intracranial vascular stenosis    Lacunar infarction ([place])    Brain MRI 07/2021 multiple small remote pontine lacunar infarcts   Near syncope 03/08/2020   Paroxysmal atrial fibrillation (HCC)    Presence of permanent cardiac pacemaker    Proteinuria 03/08/2020   Seasonal allergies    Stroke [facility])    Thyroid cancer ([place])    per pt S/p Total Thyroidectomy with Radioactive Iodine Therapy   Type 1 diabetes mellitus (HCC)    BP 126/71   Pulse 67   SpO2 93%   Opioid Risk Score:   Fall Risk Score:  `1  Depression screen PHQ 2/9  Depression screen [facility] 2/9 09/06/2021 06/29/2021 02/15/2021 01/03/2021 08/10/2020 07/28/2020 06/02/2020  Decreased Interest 3 0 0 0 0 0 0  Down, Depressed, Hopeless 2 0 0 0 0 0 0  PHQ - 2 Score 5 0 0 0 0 0 0  Altered sleeping 0 0 0 3 0 1 -  Tired,  decreased energy 1 0 0 0 0 0 -  Change in appetite 0 0 0 0 0 0 -  Feeling bad or failure about yourself  1 0 0 0 0 0 -  Trouble concentrating 3 0 0 0 0 0 -  Moving slowly or fidgety/restless 0 0 0 0 0 0 -  Suicidal thoughts 0 0 0 0 0 0 -  PHQ-9 Score 10 0 0 3 0 1 -  Difficult doing work/chores Somewhat difficult - - - - - -  Some recent data might be hidden      Review of Systems  Musculoskeletal:  Positive for back pain.       Left leg pain Left breast pain Back pain Left arm pain Left ankle pain  Neurological:  Positive for tremors and weakness.  All other systems reviewed and are negative.     Objective:   Physical Exam Vitals and nursing note reviewed.  Constitutional:      Appearance: She is obese.  HENT:     Head: Normocephalic and atraumatic.  Eyes:     Extraocular Movements: Extraocular movements intact.     Conjunctiva/sclera: Conjunctivae normal.     Pupils: Pupils are equal, round, and reactive to light.  Musculoskeletal:     Comments: Negative straight leg raising bilaterally Knees without evidence of effusion no erythema no pain with range of motion. Ankle no pain with ankle range of motion except for mild inversion pain on the left side on the lateral ankle.  Skin:    General: Skin is warm and dry.  Neurological:     Mental Status: She is alert.     Cranial Nerves: No dysarthria.     Motor: Weakness present. No atrophy or abnormal muscle tone.     Gait: Gait abnormal.     Comments: Motor strength is 4/5 in the right deltoid, bicep, tricep, grip, hip flexor, knee extensor, ankle dorsiflexor and plantar flexor 4 -/5 in the left deltoid, bicep, tricep, grip, hip flexor, knee extensor, ankle dorsiflexor and plantar flexor  Psychiatric:        Mood and Affect: Mood normal.        Behavior: Behavior normal.   Left-sided rib pain under the breast.  Tenderness to palpation as well as with deep breath.  No crepitus noted.  Assessment & Plan:   1.   History of prior right pontine hemorrhage with more recent left pontine infarct.  She has had a gradual decline in function after a fall despite skilled nursing facility admission.  She is back home but does not have any home health services.  She did do well with home health after her inpatient rehab hospitalization in early September.  Will reorder home health PT OT 2.  Left ankle instability combination of weakness as well as motor control problems.  Advised lace up ankle splint if this not effective may trial an AFO 3.  Left knee pain after fall x-rays negative exam is unremarkable, appears to be improving over time. 4.  Rib pain left side anterior lateral, advised Lidoderm patch.

## 2021-10-31 ENCOUNTER — Emergency Department (HOSPITAL_COMMUNITY): Payer: Medicare PPO

## 2021-10-31 ENCOUNTER — Emergency Department (HOSPITAL_COMMUNITY)
Admission: EM | Admit: 2021-10-31 | Discharge: 2021-11-01 | Disposition: A | Discharge status: Home / Self Care | Payer: Medicare PPO | Attending: Emergency Medicine | Admitting: Emergency Medicine

## 2021-10-31 ENCOUNTER — Other Ambulatory Visit: Payer: Self-pay

## 2021-10-31 ENCOUNTER — Encounter (HOSPITAL_COMMUNITY): Payer: Self-pay

## 2021-10-31 DIAGNOSIS — I517 Cardiomegaly: Secondary | ICD-10-CM | POA: Diagnosis not present

## 2021-10-31 DIAGNOSIS — I480 Paroxysmal atrial fibrillation: Secondary | ICD-10-CM | POA: Diagnosis not present

## 2021-10-31 DIAGNOSIS — S199XXA Unspecified injury of neck, initial encounter: Secondary | ICD-10-CM | POA: Diagnosis not present

## 2021-10-31 DIAGNOSIS — Z79899 Other long term (current) drug therapy: Secondary | ICD-10-CM | POA: Diagnosis not present

## 2021-10-31 DIAGNOSIS — Z794 Long term (current) use of insulin: Secondary | ICD-10-CM | POA: Insufficient documentation

## 2021-10-31 DIAGNOSIS — W050XXA Fall from non-moving wheelchair, initial encounter: Secondary | ICD-10-CM | POA: Diagnosis not present

## 2021-10-31 DIAGNOSIS — Z91040 Latex allergy status: Secondary | ICD-10-CM | POA: Insufficient documentation

## 2021-10-31 DIAGNOSIS — E109 Type 1 diabetes mellitus without complications: Secondary | ICD-10-CM | POA: Insufficient documentation

## 2021-10-31 DIAGNOSIS — R0902 Hypoxemia: Secondary | ICD-10-CM | POA: Diagnosis not present

## 2021-10-31 DIAGNOSIS — I700 Atherosclerosis of aorta: Secondary | ICD-10-CM | POA: Diagnosis not present

## 2021-10-31 DIAGNOSIS — G319 Degenerative disease of nervous system, unspecified: Secondary | ICD-10-CM | POA: Diagnosis not present

## 2021-10-31 DIAGNOSIS — E039 Hypothyroidism, unspecified: Secondary | ICD-10-CM | POA: Diagnosis not present

## 2021-10-31 DIAGNOSIS — Z8639 Personal history of other endocrine, nutritional and metabolic disease: Secondary | ICD-10-CM | POA: Diagnosis not present

## 2021-10-31 DIAGNOSIS — R2681 Unsteadiness on feet: Secondary | ICD-10-CM | POA: Insufficient documentation

## 2021-10-31 DIAGNOSIS — M47812 Spondylosis without myelopathy or radiculopathy, cervical region: Secondary | ICD-10-CM | POA: Diagnosis not present

## 2021-10-31 DIAGNOSIS — M4312 Spondylolisthesis, cervical region: Secondary | ICD-10-CM | POA: Diagnosis not present

## 2021-10-31 DIAGNOSIS — S2242XA Multiple fractures of ribs, left side, initial encounter for closed fracture: Secondary | ICD-10-CM

## 2021-10-31 DIAGNOSIS — M19012 Primary osteoarthritis, left shoulder: Secondary | ICD-10-CM | POA: Diagnosis not present

## 2021-10-31 DIAGNOSIS — I10 Essential (primary) hypertension: Secondary | ICD-10-CM | POA: Insufficient documentation

## 2021-10-31 DIAGNOSIS — W19XXXA Unspecified fall, initial encounter: Secondary | ICD-10-CM | POA: Diagnosis not present

## 2021-10-31 DIAGNOSIS — Z9181 History of falling: Secondary | ICD-10-CM | POA: Diagnosis not present

## 2021-10-31 DIAGNOSIS — M6281 Muscle weakness (generalized): Secondary | ICD-10-CM | POA: Insufficient documentation

## 2021-10-31 DIAGNOSIS — R0781 Pleurodynia: Secondary | ICD-10-CM | POA: Diagnosis not present

## 2021-10-31 DIAGNOSIS — S0990XA Unspecified injury of head, initial encounter: Secondary | ICD-10-CM | POA: Diagnosis not present

## 2021-10-31 DIAGNOSIS — R739 Hyperglycemia, unspecified: Secondary | ICD-10-CM | POA: Diagnosis not present

## 2021-10-31 LAB — CBC
HCT: 39.4 % (ref 36.0–46.0)
Hemoglobin: 12.7 g/dL (ref 12.0–15.0)
MCH: 28.2 pg (ref 26.0–34.0)
MCHC: 32.2 g/dL (ref 30.0–36.0)
MCV: 87.6 fL (ref 80.0–100.0)
Platelets: 163 10*3/uL (ref 150–400)
RBC: 4.5 MIL/uL (ref 3.87–5.11)
RDW: 14.2 % (ref 11.5–15.5)
WBC: 6 10*3/uL (ref 4.0–10.5)
nRBC: 0 % (ref 0.0–0.2)

## 2021-10-31 LAB — COMPREHENSIVE METABOLIC PANEL
ALT: 19 U/L (ref 0–44)
AST: 23 U/L (ref 15–41)
Albumin: 3 g/dL — ABNORMAL LOW (ref 3.5–5.0)
Alkaline Phosphatase: 107 U/L (ref 38–126)
Anion gap: 12 (ref 5–15)
BUN: 16 mg/dL (ref 8–23)
CO2: 24 mmol/L (ref 22–32)
Calcium: 8.8 mg/dL — ABNORMAL LOW (ref 8.9–10.3)
Chloride: 102 mmol/L (ref 98–111)
Creatinine, Ser: 1.19 mg/dL — ABNORMAL HIGH (ref 0.44–1.00)
GFR, Estimated: 49 mL/min — ABNORMAL LOW (ref >60)
Glucose, Bld: 115 mg/dL — ABNORMAL HIGH (ref 70–99)
Potassium: 3.6 mmol/L (ref 3.5–5.1)
Sodium: 138 mmol/L (ref 135–145)
Total Bilirubin: 1.1 mg/dL (ref 0.3–1.2)
Total Protein: 7.1 g/dL (ref 6.5–8.1)

## 2021-10-31 LAB — CBG MONITORING, ED: Glucose-Capillary: 222 mg/dL — ABNORMAL HIGH (ref 70–99)

## 2021-10-31 MED ORDER — ACETAMINOPHEN 325 MG PO TABS
650.0000 mg | ORAL_TABLET | Freq: Once | ORAL | Status: AC
  Administered 2021-10-31: 650 mg via ORAL
  Filled 2021-10-31: qty 2

## 2021-10-31 MED ORDER — OXYCODONE-ACETAMINOPHEN 5-325 MG PO TABS
1.0000 | ORAL_TABLET | Freq: Once | ORAL | Status: AC
  Administered 2021-10-31: 1 via ORAL
  Filled 2021-10-31: qty 1

## 2021-10-31 NOTE — ED Provider Notes (Signed)
Emergency Medicine Provider Triage Evaluation Note  [name] , a 70 y.o. female  was evaluated in triage.  Pt complains of a fall 2 days ago.  Patient reports she fell going to the bathroom and hit her left ribs, has constant pain in this area worse with inspiration and movement.  She also thinks that she may have hit her head and has some mild neck pain.  She reports some generalized soreness but most severe pain is present over the left ribs.  Review of Systems  Positive: Rib pain Negative: Shortness of breath, abdominal pain  Physical Exam  BP (!) 141/65 (BP Location: Right Arm)   Pulse 99   Temp 98.5 F (36.9 C) (Oral)   Resp 18   SpO2 96%  Gen:   Awake, no distress   Resp:  Normal effort, focal tenderness over the left lower ribs MSK:   Moves extremities without difficulty  Other:  No abdominal tenderness.  Mild C-spine tenderness  Medical Decision Making  Medically screening exam initiated at 11:52 AM.  Appropriate orders placed.  [name] was informed that the remainder of the evaluation will be completed by another provider, this initial triage assessment does not replace that evaluation, and the importance of remaining in the ED until their evaluation is complete.     Jacqlyn Larsen, PA-C 10/31/21 1201    Daleen Bo, MD 10/31/21 [phone]

## 2021-10-31 NOTE — ED Triage Notes (Addendum)
Patient arrived by [facility] following fall on Saturday. Complains of left rib pain and pain worse with inspiration. NAD. No bruising noted and alert and oriented. Pain with inspiration
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
## 2021-11-01 ENCOUNTER — Telehealth: Payer: Self-pay

## 2021-11-01 DIAGNOSIS — I499 Cardiac arrhythmia, unspecified: Secondary | ICD-10-CM | POA: Diagnosis not present

## 2021-11-01 DIAGNOSIS — G8190 Hemiplegia, unspecified affecting unspecified side: Secondary | ICD-10-CM | POA: Diagnosis not present

## 2021-11-01 DIAGNOSIS — R079 Chest pain, unspecified: Secondary | ICD-10-CM | POA: Diagnosis not present

## 2021-11-01 DIAGNOSIS — Z743 Need for continuous supervision: Secondary | ICD-10-CM | POA: Diagnosis not present

## 2021-11-01 DIAGNOSIS — R0789 Other chest pain: Secondary | ICD-10-CM | POA: Diagnosis not present

## 2021-11-01 NOTE — Discharge Instructions (Addendum)
Contact a health care provider if: You have a fever. Get help right away if: You have difficulty breathing or you are short of breath. You develop a cough that does not stop, or you cough up thick or bloody sputum. You have nausea, vomiting, or pain in your abdomen. Your pain gets worse and medicine does not help. 

## 2021-11-01 NOTE — Discharge Planning (Signed)
Pt currently active with [facility] for [place] services as confirmed by [facility] with Tommi Rumps of [facility].  Pt will resume [facility] services of RN/PT.

## 2021-11-01 NOTE — Evaluation (Signed)
Physical Therapy Evaluation Patient Details Name: [name] MRN: [id number] DOB: 1951-01-27 Today's Date: 11/01/2021  History of Present Illness  Pt is a 70 y/o female presenting to the ED on 11/7 secondary to fall. Found to have L 5th and 6th rib fractures. PMH includes DM, CHF, a fib, CVA, thyroid cancers, and HTN.  Clinical Impression  Pt admitted secondary to problem above with deficits below. Pt requiring mod A for bed mobility and then refused further attempts at transfers. Pt reports she lives at home with her sister and had recent SNF stay for rehab, but has since been WC bound. Although she would likely benefit from SNF level therapies, pt is likely not going to qualify, so will need max HH services at d/c. Would likely benefit from hoyer lift given deficits for increased safety with transfers as well. Will also need ambulance transport should pt be d/c'd home. Will continue to follow acutely.        Recommendations for follow up therapy are one component of a multi-disciplinary discharge planning process, led by the attending physician.  Recommendations may be updated based on patient status, additional functional criteria and insurance authorization.  Follow Up Recommendations Other (comment) (Max HH services (HHPT/OT/RN/aide) as pt unable to go to SNF)    Assistance Recommended at Discharge Frequent or constant Supervision/Assistance  Functional Status Assessment Patient has had a recent decline in their functional status and demonstrates the ability to make significant improvements in function in a reasonable and predictable amount of time.  Equipment Recommendations  Other (comment) (hoyer lift with pad)    Recommendations for Other Services       Precautions / Restrictions Precautions Precautions: Fall Restrictions Weight Bearing Restrictions: No      Mobility  Bed Mobility Overal bed mobility: Needs Assistance Bed Mobility: Supine to Sit     Supine  to sit: Mod assist     General bed mobility comments: Mod A for trunk and LE assist to come sit at EOB. Once sitting, pt refusing to attempt further mobility, but wanted to stay seated at EOB. RN notified.    Transfers                        Ambulation/Gait                  Stairs            Wheelchair Mobility    Modified Rankin (Stroke Patients Only)       Balance Overall balance assessment: Needs assistance;History of Falls Sitting-balance support: No upper extremity supported;Feet supported Sitting balance-Leahy Scale: Fair                                       Pertinent Vitals/Pain Pain Assessment: Faces Faces Pain Scale: Hurts a little bit Pain Location: L ribs Pain Descriptors / Indicators: Aching;Grimacing;Guarding Pain Intervention(s): Monitored during session;Limited activity within patient's tolerance;Repositioned    Home Living Family/patient expects to be discharged to:: Private residence Living Arrangements: Other relatives (sister and brother) Available Help at Discharge: Family;Available PRN/intermittently Type of Home: House Home Access: Ramped entrance       Home Layout: Two level;Able to live on main level with bedroom/bathroom Home Equipment: Wheelchair - power;Wheelchair - manual;BSC/3in1      Prior Function Prior Level of Function : Needs assist  Physical Assist : Mobility (physical);ADLs (physical) Mobility (physical): Transfers;Bed mobility ADLs (physical): Bathing;Dressing Mobility Comments: Pt reports needing significant assist for transfers to [facility]. Recent stay at SNF and was just discharged. ADLs Comments: Pt reports needing assist with ADLs.     Hand Dominance        Extremity/Trunk Assessment   Upper Extremity Assessment Upper Extremity Assessment: Defer to OT evaluation    Lower Extremity Assessment Lower Extremity Assessment: Generalized weakness    Cervical / Trunk  Assessment Cervical / Trunk Assessment: Kyphotic  Communication   Communication: No difficulties  Cognition Arousal/Alertness: Awake/alert Behavior During Therapy: WFL for tasks assessed/performed;Flat affect Overall Cognitive Status: No family/caregiver present to determine baseline cognitive functioning                                 General Comments: Pt with slowed processing and decreased safety awareness.        General Comments      Exercises     Assessment/Plan    PT Assessment Patient needs continued PT services  PT Problem List Decreased strength;Decreased knowledge of use of DME;Obesity;Decreased activity tolerance;Decreased safety awareness;Decreased balance;Decreased mobility;Decreased cognition       PT Treatment Interventions DME instruction;Balance training;Gait training;Functional mobility training;Patient/family education;Therapeutic activities;Wheelchair mobility training;Therapeutic exercise    PT Goals (Current goals can be found in the Care Plan section)  Acute Rehab PT Goals Patient Stated Goal: to be able to transfer by herself PT Goal Formulation: With patient Time For Goal Achievement: 11/15/21 Potential to Achieve Goals: Fair    Frequency Min 3X/week   Barriers to discharge Decreased caregiver support      Co-evaluation               AM-PAC PT "6 Clicks" Mobility  Outcome Measure Help needed turning from your back to your side while in a flat bed without using bedrails?: A Lot Help needed moving from lying on your back to sitting on the side of a flat bed without using bedrails?: A Lot Help needed moving to and from a bed to a chair (including a wheelchair)?: Total Help needed standing up from a chair using your arms (e.g., wheelchair or bedside chair)?: A Lot Help needed to walk in hospital room?: Total Help needed climbing 3-5 steps with a railing? : Total 6 Click Score: 9    End of Session   Activity Tolerance:  Patient limited by pain;Patient limited by fatigue Patient left: in bed;with nursing/sitter in room (sitting EOB in hallway in ED) Nurse Communication: Mobility status PT Visit Diagnosis: Unsteadiness on feet (R26.81);Difficulty in walking, not elsewhere classified (R26.2);Muscle weakness (generalized) (M62.81);History of falling (Z91.81)    Time: 0092-3300 PT Time Calculation (min) (ACUTE ONLY): 10 min   Charges:   PT Evaluation $PT Eval Moderate Complexity: 1 Mod          Reuel Derby, PT, DPT  Acute Rehabilitation Services  Pager: [phone] Office: [phone]   Rudean Hitt 11/01/2021, 9:53 AM

## 2021-11-01 NOTE — ED Provider Notes (Signed)
Fall a couple of days ago Has rib fractures Neg head and neck  Afraid to go home where she lives with her sister who does not provide care.   Plan: SW, PT eval in am to determine safety in discharge.   PT/SW eval pending at end of shift. Care hand off to day team.    Charlann Lange, PA-C 11/01/21 2244    Fredia Sorrow, MD 11/11/21 [phone]

## 2021-11-01 NOTE — ED Notes (Signed)
Pt verbalized understanding of d/c instructions, meds and followup care. Denies questions. VSS, no distress noted. PTAR stretcher to exit with all belongings.

## 2021-11-01 NOTE — Telephone Encounter (Signed)
Patient calls nurse line requesting DME order for oxygen. Patient reports that she was seen in the ED last night and was told that she would benefit from wearing oxygen while she sleeps.   Advised patient that per insurance guidelines, she would likely need an office visit speaking to the need of the oxygen. Patient declined scheduling appointment at this time. Patient is requesting that provider go ahead and place order and that she will talk to home health about performing oxygen evaluation in the home.   Please route back to RN team once order is placed.   Thanks.   Talbot Grumbling, RN

## 2021-11-01 NOTE — Progress Notes (Signed)
CSW spoke with patient about going home and continuing with home health. CSW also updated patients niece who stated her cousin will be at home when patient arrives. Patients niece and sister are currently at work. CSW was informed patient had a lift delivered this morning to the home.

## 2021-11-07 ENCOUNTER — Telehealth: Payer: Self-pay | Admitting: *Deleted

## 2021-11-07 NOTE — Chronic Care Management (AMB) (Signed)
  Care Management   Note  11/07/2021 Name: [name] MRN: [id number] DOB: [birth year]  [name] [name] is a 70 y.o. year old female who is a primary care patient of [name], [name], MD and is actively engaged with the care management team. I reached out to [name] by phone today to assist with re-scheduling an initial visit with the RN Case Manager  Follow up plan: Unsuccessful telephone outreach attempt made. A HIPAA compliant phone message was left for the patient providing contact information and requesting a return call.  The care management team will reach out to the patient again over the next 7 days.  If patient returns call to provider office, please advise to call [place] at [facility]  Direct Dial: [phone]

## 2021-11-11 ENCOUNTER — Other Ambulatory Visit: Payer: Self-pay | Admitting: Adult Health

## 2021-11-11 ENCOUNTER — Telehealth: Payer: Self-pay

## 2021-11-11 ENCOUNTER — Ambulatory Visit: Payer: Medicare PPO | Admitting: Family Medicine

## 2021-11-11 DIAGNOSIS — K219 Gastro-esophageal reflux disease without esophagitis: Secondary | ICD-10-CM

## 2021-11-11 DIAGNOSIS — I5032 Chronic diastolic (congestive) heart failure: Secondary | ICD-10-CM

## 2021-11-11 DIAGNOSIS — I10 Essential (primary) hypertension: Secondary | ICD-10-CM

## 2021-11-11 DIAGNOSIS — K5901 Slow transit constipation: Secondary | ICD-10-CM

## 2021-11-11 NOTE — Telephone Encounter (Signed)
Langley Gauss, RN with [facility] calls nurse line requesting medication for oral thrush. Reports that during assessment she noticed patches in mouth, consistent with thrush. Patient just finished course of nystatin, however, symptoms are persistent.   Please advise.   Talbot Grumbling, RN

## 2021-11-13 ENCOUNTER — Other Ambulatory Visit: Payer: Self-pay | Admitting: Adult Health

## 2021-11-13 DIAGNOSIS — E1069 Type 1 diabetes mellitus with other specified complication: Secondary | ICD-10-CM

## 2021-11-13 DIAGNOSIS — F339 Major depressive disorder, recurrent, unspecified: Secondary | ICD-10-CM

## 2021-11-13 DIAGNOSIS — I10 Essential (primary) hypertension: Secondary | ICD-10-CM

## 2021-11-13 DIAGNOSIS — I480 Paroxysmal atrial fibrillation: Secondary | ICD-10-CM

## 2021-11-13 DIAGNOSIS — K219 Gastro-esophageal reflux disease without esophagitis: Secondary | ICD-10-CM

## 2021-11-13 DIAGNOSIS — I5032 Chronic diastolic (congestive) heart failure: Secondary | ICD-10-CM

## 2021-11-13 DIAGNOSIS — E782 Mixed hyperlipidemia: Secondary | ICD-10-CM

## 2021-11-13 NOTE — Progress Notes (Signed)
Office Visit    Patient Name: [name] Date of Encounter: 11/14/2021  PCP:  Eulis Foster, MD   [facility]  Cardiologist:  Candee Furbish, MD  Advanced Practice Provider:  No care team member to display Electrophysiologist:  Will Meredith Leeds, MD    Chief Complaint    [name] is a 70 y.o. female with a hx of hypertension, type 1 diabetes mellitus, hyperlipidemia, thyroid cancer status post thyroidectomy, prior CVA in 2019 and 2016, and GERD presents today for follow-up for her palpitations status post Medtronic dual-chamber pacemaker implantation on 06/25/2020.  Past Medical History    Past Medical History:  Diagnosis Date   Abnormal echocardiogram    Anemia    Arthritis    BPPV (benign paroxysmal positional vertigo), right 06/13/2017   Bradycardia 03/07/2020   Cerebrovascular accident (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 ([place]) 10/29/2015   CHB (complete heart block) ([place]) 03/08/2020   CHF (congestive heart failure) ([place])    Closed fracture of left proximal humerus 07/08/2019   Decreased range of motion of left shoulder 09/03/2020   Depression    Dizzy 03/07/2020   bradycardia   Edentulous 09/03/2020   GERD (gastroesophageal reflux disease)    Hemiparesis affecting left side as late effect of cerebrovascular accident (CVA) ([place]) 03/08/2020   Hemorrhoids    History of Falls with injury 03/08/2020   Fall resulting in left humeral fracture   Hyperlipidemia    Hypertension    Hypothyroidism    Intracranial vascular stenosis    Lacunar infarction ([place])    Brain MRI 07/2021 multiple small remote pontine lacunar infarcts   Near syncope 03/08/2020   Paroxysmal atrial fibrillation (H[facility])    Presence of permanent cardiac pacemaker    Proteinuria 03/08/2020   Seasonal allergies    Stroke ([place])    Thyroid cancer ([place])    per pt S/p Total Thyroidectomy with Radioactive Iodine Therapy   Type 1  diabetes mellitus ([place])    Past Surgical History:  Procedure Laterality Date   ABDOMINAL ANGIOGRAM N/A 05/14/2012   Procedure: ABDOMINAL ANGIOGRAM;  Surgeon: Laverda Page, MD;  Location[facility] C[facility] LAB;  Service: Cardiovascular;  Laterality: N/A;   ABDOMINAL HYSTERECTOMY  1995   partial   ANKLE FRACTURE SURGERY Right    CARDIAC CATHETERIZATION     with coronary angiogram   cataract  Bilateral 2018   COLONOSCOPY N/A 09/09/2014   Procedure: COLONOSCOPY;  Surgeon: Gatha Mayer, MD;  Location: [place];  Service: Endoscopy;  Laterality: N/A;   ESOPHAGOGASTRODUODENOSCOPY (EGD) WITH PROPOFOL N/A 12/03/2018   Procedure: ESOPHAGOGASTRODUODENOSCOPY (EGD) WITH PROPOFOL;  Surgeon: Doran Stabler, MD;  Location: [place];  Service: Gastroenterology;  Laterality: N/A;   LEFT AND RIGHT HEART CATHETERIZATION WITH CORONARY ANGIOGRAM N/A 05/14/2012   Procedure: LEFT AND RIGHT HEART CATHETERIZATION WITH CORONARY ANGIOGRAM;  Surgeon: Laverda Page, MD;  Location: [facility] CATH LAB;  Service: Cardiovascular;  Laterality: N/A;   LOOP RECORDER INSERTION N/A 12/23/2018   Procedure: LOOP RECORDER INSERTION;  Surgeon: Constance Haw, MD;  Location: [place] CV LAB;  Service: Cardiovascular;  Laterality: N/A;   LOOP RECORDER REMOVAL N/A 06/25/2020   Procedure: LOOP RECORDER REMOVAL;  Surgeon: Constance Haw, MD;  Location: [place] CV LAB;  Service: Cardiovascular;  Laterality: N/A;   MINI METER   09/08/2014   ELECTRONIC INSULIN PUMP   PACEMAKER IMPLANT N/A 06/25/2020   Procedure: PACEMAKER IMPLANT;  Surgeon:  Constance Haw, MD;  Location: [place] CV LAB;  Service: Cardiovascular;  Laterality: N/A;   TEE WITHOUT CARDIOVERSION N/A 10/16/2018   Procedure: TRANSESOPHAGEAL ECHOCARDIOGRAM (TEE);  Surgeon: Buford Dresser, MD;  Location[facility] ENDOSCOPY;  Service: Cardiovascular;  Laterality: N/A;   THYROIDECTOMY  2006   for thyroid cancer    Allergies  Allergies   Allergen Reactions   Latex Rash    History of Present Illness    [name] is a 70 y.o. female with a hx of hypertension, type 1 diabetes mellitus, hyperlipidemia, thyroid cancer status post thyroidectomy, prior CVA in 2019 and 2016, and GERD presents today for follow-up for her palpitations status post Medtronic dual-chamber pacemaker implantation on 06/25/2020.  She was last seen by Dr. Curt Bears on 07/12/2021.  At that time she denied palpitations, chest pain, shortness of breath, orthopnea, PND, lower extremity edema, claudication, dizziness, presyncope, syncope, bleeding, or neurologic sequelae.  Patient was tolerating her medications without difficulty.  She was able to do all of her activities without restriction.   She was recently in the ED due to a fall and had some rib fractures at that time. Her CT of the head was negative for acute infarction, hemorrhage, hydrocephalus, extra-axial collection or mass lesion/mass effect. She is currently enrolled in physical therapy at the house. She does feel like she is getting stronger but getting in and out of the chair is still is an issue. She does have some SOB at night and does have trouble laying flat in the bed. The shortness of breath wakes her up frequently during the night. No chest pain or palpitations.   Today she is doing okay from her recent hospitalization.  She still recovering from her rib fractures.  She feels like she is a burden to her family and states today that she wants to die.  We discussed a couple options for resources.  Since she is doing home physical therapy I suggested getting tied into neuropsych since they specifically work with stroke patients.  I also sent a referral to psychiatry over at [facility] but she was hesitant to set this up since she does not want to burden her children with another appointment.  Her blood pressure was well controlled today and was 124/50.  She states it is well controlled at  home.  She does state occasionally that her oxygen saturation drops lower than 90% and she especially struggles with this at night.  She does have a BiPAP that she uses for her sleep apnea.  She still finds herself waking up short of breath in the middle of the night.  She has not had any chest pain or exertional shortness of breath.  She has not had any palpitations or fluttering in her chest.  She missed her pacemaker appointment this morning so we have rescheduled her for next week since she also needs to see a provider at this time.  Her hospital lab work was stable.  She will need repeat labs when she sees [place] back in a year.  She remains on Demadex for history of diastolic heart failure.   EKGs/Labs/Other Studies Reviewed:   The following studies were reviewed today:  Echocardiogram performed 08/05/2021  IMPRESSIONS     1. Left ventricular ejection fraction, by estimation, is 60 to 65%. The  left ventricle has normal function. The left ventricle has no regional  wall motion abnormalities. There is mild left ventricular hypertrophy.  Left ventricular diastolic parameters  are indeterminate.  2. Right ventricular systolic function is normal. The right ventricular  size is normal. There is normal pulmonary artery systolic pressure.   3. The mitral valve is normal in structure. No evidence of mitral valve  regurgitation. No evidence of mitral stenosis.   4. The aortic valve is tricuspid. Aortic valve regurgitation is not  visualized. Mild to moderate aortic valve sclerosis/calcification is  present, without any evidence of aortic stenosis.   5. The inferior vena cava is normal in size with greater than 50%  respiratory variability, suggesting right atrial pressure of 3 mmHg.   Comparison(s): No significant change from prior study. Prior images  reviewed side by side.   FINDINGS   Left Ventricle: Left ventricular ejection fraction, by estimation, is 60  to 65%. The left ventricle has  normal function. The left ventricle has no  regional wall motion abnormalities. Definity contrast agent was given IV  to delineate the left ventricular   endocardial borders. The left ventricular internal cavity size was normal  in size. There is mild left ventricular hypertrophy. Left ventricular  diastolic parameters are indeterminate.   Right Ventricle: The right ventricular size is normal. No increase in  right ventricular wall thickness. Right ventricular systolic function is  normal. There is normal pulmonary artery systolic pressure. The tricuspid  regurgitant velocity is 2.43 m/s, and   with an assumed right atrial pressure of 3 mmHg, the estimated right  ventricular systolic pressure is 64.4 mmHg.   Left Atrium: Left atrial size was normal in size.   Right Atrium: Right atrial size was normal in size.   Pericardium: There is no evidence of pericardial effusion.   Mitral Valve: The mitral valve is normal in structure. There is mild  thickening of the mitral valve leaflet(s). There is mild calcification of  the mitral valve leaflet(s). No evidence of mitral valve regurgitation. No  evidence of mitral valve stenosis.   Tricuspid Valve: The tricuspid valve is normal in structure. Tricuspid  valve regurgitation is trivial. No evidence of tricuspid stenosis.   Aortic Valve: The aortic valve is tricuspid. Aortic valve regurgitation is  not visualized. Mild to moderate aortic valve sclerosis/calcification is  present, without any evidence of aortic stenosis.   Pulmonic Valve: The pulmonic valve was normal in structure. Pulmonic valve  regurgitation is not visualized. No evidence of pulmonic stenosis.   Aorta: The aortic root is normal in size and structure.   Venous: The inferior vena cava is normal in size with greater than 50%  respiratory variability, suggesting right atrial pressure of 3 mmHg.   IAS/Shunts: No atrial level shunt detected by color flow Doppler.    Additional Comments: A device lead is visualized in the right ventricle.   EKG:  none ordered.   Recent Labs: 08/04/2021: Magnesium 1.8; TSH 1.610 10/11/2021: B Natriuretic Peptide 201.5 10/31/2021: ALT 19; BUN 16; Creatinine, Ser 1.19; Hemoglobin 12.7; Platelets 163; Potassium 3.6; Sodium 138  Recent Lipid Panel    Component Value Date/Time   CHOL 102 08/04/2021 2313   CHOL 112 06/06/2021 1127   TRIG 39 08/04/2021 2313   HDL 44 08/04/2021 2313   HDL 50 06/06/2021 1127   CHOLHDL 2.3 08/04/2021 2313   VLDL 8 08/04/2021 2313   LDLCALC 50 08/04/2021 2313   LDLCALC 49 06/06/2021 1127   LDLDIRECT 75 01/25/2012 1508    Home Medications   Current Meds  Medication Sig   acetaminophen (TYLENOL) 325 MG tablet Take 2 tablets (650 mg total) by mouth every  6 (six) hours as needed for moderate pain.   amLODipine (NORVASC) 10 MG tablet Take 1 tablet (10 mg total) by mouth daily.   apixaban (ELIQUIS) 5 MG TABS tablet Take 1 tablet (5 mg total) by mouth 2 (two) times daily.   atorvastatin (LIPITOR) 80 MG tablet Take 1 tablet (80 mg total) by mouth every evening.   fexofenadine (ALLEGRA) 180 MG tablet Take 1 tablet (180 mg total) by mouth daily as needed for allergies or rhinitis.   FLUoxetine (PROZAC) 10 MG capsule Take 3 capsules (30 mg total) by mouth daily.   gabapentin (NEURONTIN) 300 MG capsule Take 1 capsule (300 mg total) by mouth 3 (three) times daily.   insulin aspart (NOVOLOG) 100 UNIT/ML injection Inject 5 Units into the skin 3 (three) times daily before meals. For blood sugar greater than or equal to 200   Insulin Human (INSULIN PUMP) SOLN Inject 1 each into the skin 3 times daily with meals, bedtime and 2 AM.   levothyroxine (SYNTHROID) 175 MCG tablet Take 1 tablet (175 mcg total) by mouth daily before breakfast.   Magnesium Hydroxide (MILK OF MAGNESIA PO) Take by mouth. give 30 cc Milk of Magnesium p.o. x 1 dose in 24 hours as needed   Multiple Vitamins-Minerals (OCUVITE PO)  Take 1 tablet by mouth daily.   nystatin (MYCOSTATIN) 100000 UNIT/ML suspension Take 5 mLs (500,000 Units total) by mouth 4 (four) times daily.   pantoprazole (PROTONIX) 40 MG tablet Take 1 tablet (40 mg total) by mouth 2 (two) times daily.   polyethylene glycol powder (GLYCOLAX/MIRALAX) 17 GM/SCOOP powder Take 17 g by mouth daily.   potassium chloride (KLOR-CON) 10 MEQ tablet Take 1 tablet (10 mEq total) by mouth daily.   Sodium Phosphates (RA SALINE ENEMA RE) Place 1 Dose rectally as needed.   torsemide (DEMADEX) 10 MG tablet Take 1 tablet (10 mg total) by mouth daily.     Review of Systems      +PND +Orthopnea   Physical Exam    VS:  BP (!) 124/50   Pulse 72   Ht 5\' 7"  (1.702 m)   SpO2 93%   BMI 39.28 kg/m  , BMI Body mass index is 39.28 kg/m.  Wt Readings from Last 3 Encounters:  10/27/21 250 lb 12.8 oz (113.8 kg)  10/21/21 249 lb 9.6 oz (113.2 kg)  10/13/21 263 lb (119.3 kg)     GEN: Well nourished, well developed, in no acute distress. HEENT: normal. Cardiac: RRR, no murmurs, rubs, or gallops. No clubbing, cyanosis, edema.  Radials/PT 2+ and equal bilaterally.  Respiratory:  Respirations regular and unlabored, clear to auscultation bilaterally. GI: Soft, nontender, nondistended. MS: No deformity or atrophy. Skin: Warm and dry, no rash. Neuro:  Strength and sensation are intact. Psych: Normal affect.  Assessment & Plan    Cryptogenic stroke-strokes in 2016 and 2019.  Atrial fibrillation was noted back on her 10/14/2018 echocardiogram. Now on Eliquis. Recent (07/2021) carotid US showed bilateral 1-39% stenosis.   Hypertension-well-controlled on her current medical regimen.  Continue a low-sodium diet.  Encouraged to continue taking blood pressure at home. At home she is well controlled. Encouraged to continue to take her BP at home.  Hyperlipidemia-Continue statin.  Recent LDL on 08/04/2021 was 50. Recommend annual Lipid profile and LFTs while taking a  statin.  Complete heart block status post [employer] dual-chamber pacemaker implantation 06/25/2020. She missed her device check today. We have rescheduled her with a provider next week.   Type  1 diabetes mellitus-A1c has not been well controlled.  As of 08/04/2021 hemoglobin A1c of 10.3.  Will refer to PCP. She sees her on Wed. She does occasionally have low glucose at night. Discussed keeping crackers at the bedside.   Sleep apnea-BiPAP at home and she wears it intermittently. She does still wake up in the middle of the night short of breath. It has been better sleeping in her assist recliner.   Situational Depression/Suicidal Ideation-She feels that she is a burden to her family who takes care of her. They take her to all her appointments. I referred her to Psych over at [facility] and also spoke with her about resources PT may be able to provide with Neuropsych in [place] patients specifically.   Diastolic HF- continue taking Demedex. She does not appear fluid overloaded clinically. BNP 201. Continue daily weights.    Disposition: Follow up in 1 year(s) with Candee Furbish, MD or APP.  Signed, Elgie Collard, PA-C 11/14/2021, 3:41 PM [facility]

## 2021-11-14 ENCOUNTER — Encounter: Payer: Self-pay | Admitting: Physician Assistant

## 2021-11-14 ENCOUNTER — Ambulatory Visit: Payer: Medicare PPO | Admitting: Physician Assistant

## 2021-11-14 ENCOUNTER — Telehealth: Payer: Self-pay

## 2021-11-14 ENCOUNTER — Other Ambulatory Visit: Payer: Self-pay

## 2021-11-14 ENCOUNTER — Encounter: Payer: Medicare PPO | Admitting: Student

## 2021-11-14 ENCOUNTER — Other Ambulatory Visit: Payer: Self-pay | Admitting: Family Medicine

## 2021-11-14 VITALS — BP 124/50 | HR 72 | Ht 67.0 in

## 2021-11-14 DIAGNOSIS — F0631 Mood disorder due to known physiological condition with depressive features: Secondary | ICD-10-CM

## 2021-11-14 DIAGNOSIS — R45851 Suicidal ideations: Secondary | ICD-10-CM | POA: Diagnosis not present

## 2021-11-14 DIAGNOSIS — E1059 Type 1 diabetes mellitus with other circulatory complications: Secondary | ICD-10-CM

## 2021-11-14 DIAGNOSIS — I639 Cerebral infarction, unspecified: Secondary | ICD-10-CM

## 2021-11-14 DIAGNOSIS — I442 Atrioventricular block, complete: Secondary | ICD-10-CM

## 2021-11-14 DIAGNOSIS — E785 Hyperlipidemia, unspecified: Secondary | ICD-10-CM | POA: Diagnosis not present

## 2021-11-14 DIAGNOSIS — I10 Essential (primary) hypertension: Secondary | ICD-10-CM | POA: Diagnosis not present

## 2021-11-14 DIAGNOSIS — I69398 Other sequelae of cerebral infarction: Secondary | ICD-10-CM | POA: Diagnosis not present

## 2021-11-14 MED ORDER — NYSTATIN 100000 UNIT/ML MT SUSP: 5.0000 mL | Freq: Four times a day (QID) | OROMUCOSAL | 0 refills | Status: DC

## 2021-11-14 NOTE — Telephone Encounter (Signed)
Claiborne Billings, PT from [place] calls nurse line requesting OT evaluation. Verbal orders given per protocol.   Talbot Grumbling, RN

## 2021-11-14 NOTE — Chronic Care Management (AMB) (Signed)
  Care Management   Note  11/14/2021 Name: [name] MRN: [id number] DOB: 12/27/1950  [name] [name] is a 69 y.o. year old female who is a primary care patient of [name], [name], MD and is actively engaged with the care management team. I reached out to [name] by phone today to assist with re-scheduling an initial visit with the RN Case Manager  Follow up plan: Unsuccessful telephone outreach attempt made. A HIPAA compliant phone message was left for the patient providing contact information and requesting a return call.  The care management team will reach out to the patient again over the next 7 days.  If patient returns call to provider office, please advise to call [facility] at [phone].  [facility] Management  Direct Dial: [phone]

## 2021-11-14 NOTE — Patient Instructions (Addendum)
Medication Instructions:   Your physician recommends that you continue on your current medications as directed. Please refer to the Current Medication list given to you today.  *If you need a refill on your cardiac medications before your next appointment, please call your pharmacy*   Lab Work:  -NONE  If you have labs (blood work) drawn today and your tests are completely normal, you will receive your results only by: [place] (if you have MyChart) OR A paper copy in the mail If you have any lab test that is abnormal or we need to change your treatment, we will call you to review the results.   Testing/Procedures:  -NONE    Follow-Up: At [facility], you and your health needs are our priority.  As part of our continuing mission to provide you with exceptional heart care, we have created designated Provider Care Teams.  These Care Teams include your primary Cardiologist (physician) and Advanced Practice Providers (APPs -  Physician Assistants and Nurse Practitioners) who all work together to provide you with the care you need, when you need it.  We recommend signing up for the patient portal called "MyChart".  Sign up information is provided on this After Visit Summary.  MyChart is used to connect with patients for Virtual Visits (Telemedicine).  Patients are able to view lab/test results, encounter notes, upcoming appointments, etc.  Non-urgent messages can be sent to your provider as well.   To learn more about what you can do with MyChart, go to [email or web address].    Your next appointment:   1 year(s)  The format for your next appointment:   In Person  Provider:   Candee Furbish, MD     Other Instructions Your physician wants you to follow-up in: 1 year with Dr.Skains. You will receive a reminder letter in the mail two months in advance. If you don't receive a letter, please call our office to schedule the follow-up appointment.    Heart-Healthy Eating  Plan Many factors influence your heart (coronary) health, including eating and exercise habits. Coronary risk increases with abnormal blood fat (lipid) levels. Heart-healthy meal planning includes limiting unhealthy fats, increasing healthy fats, and making other diet and lifestyle changes. What is my plan? Your health care provider may recommend that you: Limit your fat intake to _________% or less of your total calories each day. Limit your saturated fat intake to _________% or less of your total calories each day. Limit the amount of cholesterol in your diet to less than _________ mg per day. What are tips for following this plan? Cooking Cook foods using methods other than frying. Baking, boiling, grilling, and broiling are all good options. Other ways to reduce fat include: Removing the skin from poultry. Removing all visible fats from meats. Steaming vegetables in water or broth. Meal planning  At meals, imagine dividing your plate into fourths: Fill one-half of your plate with vegetables and green salads. Fill one-fourth of your plate with whole grains. Fill one-fourth of your plate with lean protein foods. Eat 4-5 servings of vegetables per day. One serving equals 1 cup raw or cooked vegetable, or 2 cups raw leafy greens. Eat 4-5 servings of fruit per day. One serving equals 1 medium whole fruit,  cup dried fruit,  cup fresh, frozen, or canned fruit, or  cup 100% fruit juice. Eat more foods that contain soluble fiber. Examples include apples, broccoli, carrots, beans, peas, and barley. Aim to get 25-30 g of fiber per day. Increase your  consumption of legumes, nuts, and seeds to 4-5 servings per week. One serving of dried beans or legumes equals  cup cooked, 1 serving of nuts is  cup, and 1 serving of seeds equals 1 tablespoon. Fats Choose healthy fats more often. Choose monounsaturated and polyunsaturated fats, such as olive and canola oils, flaxseeds, walnuts, almonds, and  seeds. Eat more omega-3 fats. Choose salmon, mackerel, sardines, tuna, flaxseed oil, and ground flaxseeds. Aim to eat fish at least 2 times each week. Check food labels carefully to identify foods with trans fats or high amounts of saturated fat. Limit saturated fats. These are found in animal products, such as meats, butter, and cream. Plant sources of saturated fats include palm oil, palm kernel oil, and coconut oil. Avoid foods with partially hydrogenated oils in them. These contain trans fats. Examples are stick margarine, some tub margarines, cookies, crackers, and other baked goods. Avoid fried foods. General information Eat more home-cooked food and less restaurant, buffet, and fast food. Limit or avoid alcohol. Limit foods that are high in starch and sugar. Lose weight if you are overweight. Losing just 5-10% of your body weight can help your overall health and prevent diseases such as diabetes and heart disease. Monitor your salt (sodium) intake, especially if you have high blood pressure. Talk with your health care provider about your sodium intake. Try to incorporate more vegetarian meals weekly. What foods can I eat? Fruits All fresh, canned (in natural juice), or frozen fruits. Vegetables Fresh or frozen vegetables (raw, steamed, roasted, or grilled). Green salads. Grains Most grains. Choose whole wheat and whole grains most of the time. Rice and pasta, including brown rice and pastas made with whole wheat. Meats and other proteins Lean, well-trimmed beef, veal, pork, and lamb. Chicken and [place] without skin. All fish and shellfish. Wild duck, rabbit, pheasant, and venison. Egg whites or low-cholesterol egg substitutes. Dried beans, peas, lentils, and tofu. Seeds and most nuts. Dairy Low-fat or nonfat cheeses, including ricotta and mozzarella. Skim or 1% milk (liquid, powdered, or evaporated). Buttermilk made with low-fat milk. Nonfat or low-fat yogurt. Fats and  oils Non-hydrogenated (trans-free) margarines. Vegetable oils, including soybean, sesame, sunflower, olive, peanut, safflower, corn, canola, and cottonseed. Salad dressings or mayonnaise made with a vegetable oil. Beverages Water (mineral or sparkling). Coffee and tea. Diet carbonated beverages. Sweets and desserts Sherbet, gelatin, and fruit ice. Small amounts of dark chocolate. Limit all sweets and desserts. Seasonings and condiments All seasonings and condiments. The items listed above may not be a complete list of foods and beverages you can eat. Contact a dietitian for more options. What foods are not recommended? Fruits Canned fruit in heavy syrup. Fruit in cream or butter sauce. Fried fruit. Limit coconut. Vegetables Vegetables cooked in cheese, cream, or butter sauce. Fried vegetables. Grains Breads made with saturated or trans fats, oils, or whole milk. Croissants. Sweet rolls. Donuts. High-fat crackers, such as cheese crackers. Meats and other proteins Fatty meats, such as hot dogs, ribs, sausage, bacon, rib-eye roast or steak. High-fat deli meats, such as salami and bologna. Caviar. Domestic duck and goose. Organ meats, such as liver. Dairy Cream, sour cream, cream cheese, and creamed cottage cheese. Whole-milk cheeses. Whole or 2% milk (liquid, evaporated, or condensed). Whole buttermilk. Cream sauce or high-fat cheese sauce. Whole-milk yogurt. Fats and oils Meat fat, or shortening. Cocoa butter, hydrogenated oils, palm oil, coconut oil, palm kernel oil. Solid fats and shortenings, including bacon fat, salt pork, lard, and butter. Nondairy cream substitutes. Salad  dressings with cheese or sour cream. Beverages Regular sodas and any drinks with added sugar. Sweets and desserts Frosting. Pudding. Cookies. Cakes. Pies. Milk chocolate or white chocolate. Buttered syrups. Full-fat ice cream or ice cream drinks. The items listed above may not be a complete list of foods and beverages  to avoid. Contact a dietitian for more information. Summary Heart-healthy meal planning includes limiting unhealthy fats, increasing healthy fats, and making other diet and lifestyle changes. Lose weight if you are overweight. Losing just 5-10% of your body weight can help your overall health and prevent diseases such as diabetes and heart disease. Focus on eating a balance of foods, including fruits and vegetables, low-fat or nonfat dairy, lean protein, nuts and legumes, whole grains, and heart-healthy oils and fats. This information is not intended to replace advice given to you by your health care provider. Make sure you discuss any questions you have with your health care provider. Document Revised: 04/21/2021 Document Reviewed: 04/21/2021 Elsevier Patient Education  2022 [place] have been referred to psychiatry  Your physician recommends that you keep your scheduled  follow-up appointment with Tommye Standard, NP on Wednesday, November 30 @ 3:35 pm.

## 2021-11-14 NOTE — Telephone Encounter (Signed)
Nystatin prescription sent to pharmacy   Eulis Foster, MD [place], PGY-3 [phone]

## 2021-11-15 ENCOUNTER — Telehealth: Payer: Self-pay | Admitting: Licensed Clinical Social Worker

## 2021-11-15 ENCOUNTER — Telehealth: Payer: Self-pay

## 2021-11-15 DIAGNOSIS — R498 Other voice and resonance disorders: Secondary | ICD-10-CM

## 2021-11-15 NOTE — Telephone Encounter (Signed)
Order placed for MBS per SLP recommendations. Please fax per telephone encounter.   Eulis Foster, MD [place], PGY-3 [phone]

## 2021-11-15 NOTE — Telephone Encounter (Signed)
CSW received referral to assess patient for mental health needs. Patient states she is wheelchair bound although able to wheel herself independently around the home. She has family members who love with her and provide all her care. She states that at times she feels like "it is too much for them". Patient denies suicidal ideation stating "I am a christian and would never harm myself". She shared her daily routine and she loves to watch all the crime shows on TV. She states that she is looking forward to Thanksgiving as all her family will be in town to celebrate. Patient agreeable to referral to Elias Else, [facility] Specialist for supportive needs. Patient states she thinks it would be helpful to talk with someone as she frequently feels like she is a burden. CSW will refer patient and will be available if needed. Raquel Sarna, [place], [place]

## 2021-11-15 NOTE — Telephone Encounter (Signed)
SLP with [facility] calls nurse line requesting an order (referral) for a Modified Barium Swallow.   Once order is placed this needs to be faxed to [phone].   The study is for vocal quality and differentials across intake.   If you have any questions you can call SLP at [phone].

## 2021-11-15 NOTE — Progress Notes (Signed)
SUBJECTIVE:   CHIEF COMPLAINT / HPI: oxygen recommendation at night  Pt reports that her oxygen level will go down to 85% at times and they have to give her oxygen to increase it to 91%. She would like to be formally evaluated. She reports feeling SOB and tired at times even while resting. She feels uncomfortable at night and reports not being able to get a good night's sleep. She reports that she wears a bipap machine at night but she often takes it off because it dries her nose and makes it harder to sleep.Patient reports adherence to her heart failure medications.   Hypokalemia  Patient requests to have K checked as she has needed to take K supplements in the past. Patient continues to take torsemide therapy on regular basis.   T1DM  Patient follows with endocrinology. Has continuous glucose monitor on during visit with insulin pump. Patient presents for A1c check. Previously >10.   HM  Patient changed her mind and would like influenza vaccine.   PERTINENT  PMH / PSH:  T1DM HFpEF  HTN  Afib  Left [place] Stroke   OBJECTIVE:   BP 112/60   Pulse 91   Ht 5\' 7"  (1.702 m)   SpO2 96%   BMI 39.28 kg/m   Physical Exam Constitutional:      General: She is not in acute distress.    Appearance: Normal appearance. She is obese. She is not ill-appearing, toxic-appearing or diaphoretic.  Eyes:     Comments: Wearing sunglasses   Cardiovascular:     Rate and Rhythm: Normal rate and regular rhythm.     Pulses: Normal pulses.     Heart sounds: No murmur heard. Pulmonary:     Effort: Pulmonary effort is normal.     Breath sounds: Normal breath sounds. No wheezing, rhonchi or rales.  Abdominal:     General: Bowel sounds are normal.     Palpations: Abdomen is soft.     Tenderness: There is abdominal tenderness.  Musculoskeletal:        General: No swelling.     Right lower leg: No edema.     Left lower leg: No edema.  Neurological:     Mental Status: She is alert and oriented  to person, place, and time. Mental status is at baseline.     Motor: Weakness present.     ASSESSMENT/PLAN:   Suicidal ideation Contacted patient via telephone after reviewing PHQ9 with positive response to SI. Patient confirmed these feelings stating that she feels as if she is a burden to her family and tha she is "tired" of all of her medical issues and limited quality of life. Patient was tearful intermittently over the telephone. Patient repeatedly emphasized her beliefs that she would not harm herself because she believes suicide is wrong but if she were to pass in God's timing, then she would be at peace with this. Patient offered follow up wellness visit to her home, patient declined and repeatedly stated she did not want to be bothered. She also declined offered for CCM follow up or services for counseling or senior day care activity resources to help with feelings of loneliness and being in the home most of the day. Informed patient that we recommended ED or behavioral health urgent care if she has overwhelming feelings of depression and decides she does want to harm herself. She again denied intention. Informed her we would conduct wellness call on 11/21/21. She was ultimately agreeable to this  but was adamant that she did not want any wellness checks at her home as her family was present.   Hypoxia, sleep related Patient reports hypoxic events with recommendation from outside provider for night time oxygen. Patient's oxygen in clinic today wNL at 96% at rest. Recommended a sleep study evaluation. Referral submitted to pulmonology.    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

## 2021-11-16 ENCOUNTER — Ambulatory Visit: Payer: Medicare PPO | Admitting: Family Medicine

## 2021-11-16 ENCOUNTER — Other Ambulatory Visit: Payer: Self-pay

## 2021-11-16 ENCOUNTER — Encounter: Payer: Self-pay | Admitting: Family Medicine

## 2021-11-16 ENCOUNTER — Telehealth (HOSPITAL_COMMUNITY): Payer: Self-pay

## 2021-11-16 VITALS — BP 112/60 | HR 91 | Ht 67.0 in

## 2021-11-16 DIAGNOSIS — E1059 Type 1 diabetes mellitus with other circulatory complications: Secondary | ICD-10-CM | POA: Diagnosis not present

## 2021-11-16 DIAGNOSIS — I10 Essential (primary) hypertension: Secondary | ICD-10-CM | POA: Diagnosis not present

## 2021-11-16 DIAGNOSIS — E1069 Type 1 diabetes mellitus with other specified complication: Secondary | ICD-10-CM

## 2021-11-16 DIAGNOSIS — E890 Postprocedural hypothyroidism: Secondary | ICD-10-CM

## 2021-11-16 DIAGNOSIS — E876 Hypokalemia: Secondary | ICD-10-CM | POA: Diagnosis not present

## 2021-11-16 DIAGNOSIS — I152 Hypertension secondary to endocrine disorders: Secondary | ICD-10-CM

## 2021-11-16 DIAGNOSIS — R45851 Suicidal ideations: Secondary | ICD-10-CM

## 2021-11-16 DIAGNOSIS — R296 Repeated falls: Secondary | ICD-10-CM | POA: Diagnosis not present

## 2021-11-16 DIAGNOSIS — F339 Major depressive disorder, recurrent, unspecified: Secondary | ICD-10-CM | POA: Diagnosis not present

## 2021-11-16 DIAGNOSIS — E782 Mixed hyperlipidemia: Secondary | ICD-10-CM

## 2021-11-16 DIAGNOSIS — Z23 Encounter for immunization: Secondary | ICD-10-CM | POA: Diagnosis not present

## 2021-11-16 DIAGNOSIS — Z9189 Other specified personal risk factors, not elsewhere classified: Secondary | ICD-10-CM

## 2021-11-16 DIAGNOSIS — T502X5A Adverse effect of carbonic-anhydrase inhibitors, benzothiadiazides and other diuretics, initial encounter: Secondary | ICD-10-CM | POA: Diagnosis not present

## 2021-11-16 DIAGNOSIS — I480 Paroxysmal atrial fibrillation: Secondary | ICD-10-CM

## 2021-11-16 DIAGNOSIS — E1142 Type 2 diabetes mellitus with diabetic polyneuropathy: Secondary | ICD-10-CM | POA: Diagnosis not present

## 2021-11-16 DIAGNOSIS — W19XXXS Unspecified fall, sequela: Secondary | ICD-10-CM

## 2021-11-16 DIAGNOSIS — G4734 Idiopathic sleep related nonobstructive alveolar hypoventilation: Secondary | ICD-10-CM | POA: Diagnosis not present

## 2021-11-16 DIAGNOSIS — E1159 Type 2 diabetes mellitus with other circulatory complications: Secondary | ICD-10-CM

## 2021-11-16 DIAGNOSIS — R0902 Hypoxemia: Secondary | ICD-10-CM | POA: Insufficient documentation

## 2021-11-16 HISTORY — DX: Suicidal ideations: R45.851

## 2021-11-16 LAB — POCT GLYCOSYLATED HEMOGLOBIN (HGB A1C): HbA1c, POC (controlled diabetic range): 8.5 % — AB (ref 0.0–7.0)

## 2021-11-16 MED ORDER — AMLODIPINE BESYLATE 10 MG PO TABS: 10.0000 mg | ORAL_TABLET | Freq: Every day | ORAL | 1 refills | Status: DC

## 2021-11-16 MED ORDER — APIXABAN 5 MG PO TABS: 5.0000 mg | ORAL_TABLET | Freq: Two times a day (BID) | ORAL | 0 refills | Status: DC

## 2021-11-16 MED ORDER — GABAPENTIN 300 MG PO CAPS
300.0000 mg | ORAL_CAPSULE | Freq: Three times a day (TID) | ORAL | 1 refills | Status: DC
Start: 2021-11-16 — End: 2022-03-02

## 2021-11-16 MED ORDER — LEVOTHYROXINE SODIUM 175 MCG PO TABS
175.0000 ug | ORAL_TABLET | Freq: Every day | ORAL | 0 refills | Status: DC
Start: 2021-11-16 — End: 2022-09-17

## 2021-11-16 MED ORDER — ATORVASTATIN CALCIUM 80 MG PO TABS: 80.0000 mg | ORAL_TABLET | Freq: Every evening | ORAL | 1 refills | Status: DC

## 2021-11-16 MED ORDER — FLUOXETINE HCL 10 MG PO CAPS
10.0000 mg | ORAL_CAPSULE | Freq: Every day | ORAL | 1 refills | Status: DC
Start: 2021-11-16 — End: 2022-02-15

## 2021-11-16 MED ORDER — FLUOXETINE HCL 20 MG PO CAPS: 20.0000 mg | ORAL_CAPSULE | Freq: Every day | ORAL | 1 refills | Status: DC

## 2021-11-16 NOTE — Assessment & Plan Note (Signed)
Contacted patient via telephone after reviewing PHQ9 with positive response to SI. Patient confirmed these feelings stating that she feels as if she is a burden to her family and tha she is "tired" of all of her medical issues and limited quality of life. Patient was tearful intermittently over the telephone. Patient repeatedly emphasized her beliefs that she would not harm herself because she believes suicide is wrong but if she were to pass in God's timing, then she would be at peace with this. Patient offered follow up wellness visit to her home, patient declined and repeatedly stated she did not want to be bothered. She also declined offered for CCM follow up or services for counseling or senior day care activity resources to help with feelings of loneliness and being in the home most of the day. Informed patient that we recommended ED or behavioral health urgent care if she has overwhelming feelings of depression and decides she does want to harm herself. She again denied intention. Informed her we would conduct wellness call on 11/21/21. She was ultimately agreeable to this but was adamant that she did not want any wellness checks at her home as her family was present.

## 2021-11-16 NOTE — Patient Instructions (Signed)
I have submitted a referral to pulmonology for an evaluation to see if you will need supplemental oxygen.  Your oxygen saturation today is 96% which is well within normal limits.  I believe you will benefit from additional evaluation to see if you will need additional nighttime therapy given your history of heart failure and lower oxygen levels measured by other providers.  I have also submitted an order for you to have a bone density scan. Please call for the breast center at [phone] to schedule this to have this recommended test completed.   You have declined your influenza vaccine for today. Please let us know if you change your mind.   I will check your blood work for your vitamin D levels and potassium levels.   I have also submitted refills for your prescriptions.   Dr. Quentin Cornwall

## 2021-11-16 NOTE — Assessment & Plan Note (Signed)
Check BMP today  Hx of hypokalemia requiring supplementation, on torsemide therapy

## 2021-11-16 NOTE — Telephone Encounter (Signed)
Order faxed to provided number.   Talbot Grumbling, RN

## 2021-11-16 NOTE — Assessment & Plan Note (Signed)
A1c checked today  Continues to follow with endocrinology  Wears CGM and insulin pump

## 2021-11-16 NOTE — Assessment & Plan Note (Signed)
Patient reports hypoxic events with recommendation from outside provider for night time oxygen. Patient's oxygen in clinic today wNL at 96% at rest. Recommended a sleep study evaluation. Referral submitted to pulmonology.

## 2021-11-16 NOTE — Telephone Encounter (Signed)
Attempted to contact patient to schedule OP MBS - left voicemail. ?

## 2021-11-17 LAB — BASIC METABOLIC PANEL
BUN/Creatinine Ratio: 12 (ref 12–28)
BUN: 17 mg/dL (ref 8–27)
CO2: 25 mmol/L (ref 20–29)
Calcium: 9.1 mg/dL (ref 8.7–10.3)
Chloride: 103 mmol/L (ref 96–106)
Creatinine, Ser: 1.41 mg/dL — ABNORMAL HIGH (ref 0.57–1.00)
Glucose: 202 mg/dL — ABNORMAL HIGH (ref 70–99)
Potassium: 4.3 mmol/L (ref 3.5–5.2)
Sodium: 142 mmol/L (ref 134–144)
eGFR: 40 mL/min/{1.73_m2} — ABNORMAL LOW (ref >59)

## 2021-11-17 LAB — VITAMIN D 25 HYDROXY (VIT D DEFICIENCY, FRACTURES): Vit D, 25-Hydroxy: 34.7 ng/mL (ref 30.0–100.0)

## 2021-11-18 ENCOUNTER — Telehealth: Payer: Self-pay | Admitting: Family Medicine

## 2021-11-18 MED ORDER — NYSTATIN 100000 UNIT/ML MT SUSP: 5.0000 mL | Freq: Four times a day (QID) | OROMUCOSAL | 0 refills | Status: DC

## 2021-11-18 NOTE — Telephone Encounter (Signed)
**  After Hours/ Emergency Line Call**  Received a call to report that [name].  Endorsing that she is still having continued thrush symptoms.  Denying inability to swallow, though notes it is more difficult with the thrush. She was prescribed Nystatin suspension and had improvement, she would like a refill.  Recommended that she pick up the refill medication and call the office on Monday if she is still having continued symptoms.  Red flags discussed.  Will forward to PCP.   Rise Patience, DO PGY-2, [facility] 11/18/2021 1:17 PM

## 2021-11-21 NOTE — Chronic Care Management (AMB) (Signed)
  Care Management   Note  11/21/2021 Name: [name] MRN: [id number] DOB: 1951-05-18  [name] is a 70 y.o. year old female who is a primary care patient of [name], [name], MD and is actively engaged with the care management team. I reached out to [name] by phone today to assist with re-scheduling an initial visit with the RN Case Manager  Follow up plan: A third unsuccessful telephone outreach attempt made. A HIPAA compliant phone message was left for the patient providing contact information and requesting a return call. Unable to make contact on outreach attempts x 3. PCP [name], [name], MD notified via routed documentation in medical record. We have been unable to make contact with the patient for follow up. The care management team is available to follow up with the patient after provider conversation with the patient regarding recommendation for care management engagement and subsequent re-referral to the care management team.  If patient returns call to provider office, please advise to call [facility] at [phone].  [facility] Management  Direct Dial: [phone]

## 2021-11-22 ENCOUNTER — Telehealth: Payer: Self-pay | Admitting: Adult Health

## 2021-11-22 ENCOUNTER — Other Ambulatory Visit (HOSPITAL_COMMUNITY): Payer: Self-pay

## 2021-11-22 DIAGNOSIS — R1310 Dysphagia, unspecified: Secondary | ICD-10-CM

## 2021-11-22 NOTE — Telephone Encounter (Signed)
Pt was called, she was offered the 12-01 appointment.  Pt has declined future appointments, pt states she will not be using the CPAP at this time because it dries her out.  This is FYI, pt has not asked to be called back.

## 2021-11-22 NOTE — Telephone Encounter (Signed)
Pt called, mask for CPAP is drying me out, need prescription for new mask. Not able to sleep at night. Would like a call from the nurse.

## 2021-11-22 NOTE — Telephone Encounter (Signed)
Please advise patient that I would be happy to see her back in sleep clinic to discuss treatment options, I would not recommend she remain untreated for severe obstructive sleep apnea. She is also advised to follow-up with the stroke team as planned or scheduled.  I will copy Janett Billow and Dr. Leonie Man.

## 2021-11-22 NOTE — Telephone Encounter (Signed)
Lattie Haw This pt hasnt been seen in over a yr, she needs to come in for FU with [facility] and please have her bring CPAP machine/power cord in with her.  FYI POD4 if you all have anything to open up.

## 2021-11-23 ENCOUNTER — Ambulatory Visit: Payer: Medicare PPO | Admitting: Physician Assistant

## 2021-11-23 ENCOUNTER — Other Ambulatory Visit: Payer: Self-pay

## 2021-11-23 ENCOUNTER — Encounter: Payer: Self-pay | Admitting: Physician Assistant

## 2021-11-23 VITALS — BP 120/64 | HR 70 | Ht 67.0 in | Wt 250.0 lb

## 2021-11-23 DIAGNOSIS — I10 Essential (primary) hypertension: Secondary | ICD-10-CM

## 2021-11-23 DIAGNOSIS — I480 Paroxysmal atrial fibrillation: Secondary | ICD-10-CM | POA: Diagnosis not present

## 2021-11-23 DIAGNOSIS — I5032 Chronic diastolic (congestive) heart failure: Secondary | ICD-10-CM | POA: Diagnosis not present

## 2021-11-23 DIAGNOSIS — Z950 Presence of cardiac pacemaker: Secondary | ICD-10-CM

## 2021-11-23 LAB — CUP PACEART INCLINIC DEVICE CHECK
Battery Remaining Longevity: 125 mo
Battery Voltage: 3.03 V
Brady Statistic AP VP Percent: 2.32 %
Brady Statistic AP VS Percent: 15.35 %
Brady Statistic AS VP Percent: 29.96 %
Brady Statistic AS VS Percent: 52.35 %
Brady Statistic RA Percent Paced: 15.61 %
Brady Statistic RV Percent Paced: 30.13 %
Date Time Interrogation Session: 20221130173339
Implantable Lead Implant Date: 20210702
Implantable Lead Implant Date: 20210702
Implantable Lead Location: 753859
Implantable Lead Location: 753860
Implantable Lead Model: 5076
Implantable Lead Model: 5076
Implantable Pulse Generator Implant Date: 20210702
Lead Channel Impedance Value: 304 Ohm
Lead Channel Impedance Value: 342 Ohm
Lead Channel Impedance Value: 361 Ohm
Lead Channel Impedance Value: 437 Ohm
Lead Channel Pacing Threshold Amplitude: 0.5 V
Lead Channel Pacing Threshold Amplitude: 0.5 V
Lead Channel Pacing Threshold Pulse Width: 0.4 ms
Lead Channel Pacing Threshold Pulse Width: 0.4 ms
Lead Channel Sensing Intrinsic Amplitude: 10.875 mV
Lead Channel Sensing Intrinsic Amplitude: 4.625 mV
Lead Channel Sensing Intrinsic Amplitude: 4.75 mV
Lead Channel Sensing Intrinsic Amplitude: 8.5 mV
Lead Channel Setting Pacing Amplitude: 1.5 V
Lead Channel Setting Pacing Amplitude: 2.5 V
Lead Channel Setting Pacing Pulse Width: 0.4 ms
Lead Channel Setting Sensing Sensitivity: 1.2 mV

## 2021-11-23 MED ORDER — METOPROLOL SUCCINATE ER 50 MG PO TB24: 50.0000 mg | ORAL_TABLET | Freq: Every day | ORAL | 3 refills | Status: DC

## 2021-11-23 MED ORDER — AMLODIPINE BESYLATE 5 MG PO TABS: 5.0000 mg | ORAL_TABLET | Freq: Every day | ORAL | 3 refills | Status: DC

## 2021-11-23 NOTE — Patient Instructions (Signed)
Medication InMedication Instructions:   START  TAKING : TOPROL XL 50 MG ONCE A DAY   START TAKING :  AMLODIPINE 5 MG ONCE A  DAY    *If you need a refill on your cardiac medications before your next appointment, please call your pharmacy*   Lab Work: NONE ORDERED  TODAY    If you have labs (blood work) drawn today and your tests are completely normal, you will receive your results only by: [place] (if you have MyChart) OR A paper copy in the mail If you have any lab test that is abnormal or we need to change your treatment, we will call you to review the results.   Testing/Procedures: NONE ORDERED  TODAY    Follow-Up: At [facility], you and your health needs are our priority.  As part of our continuing mission to provide you with exceptional heart care, we have created designated Provider Care Teams.  These Care Teams include your primary Cardiologist (physician) and Advanced Practice Providers (APPs -  Physician Assistants and Nurse Practitioners) who all work together to provide you with the care you need, when you need it.  We recommend signing up for the patient portal called "MyChart".  Sign up information is provided on this After Visit Summary.  MyChart is used to connect with patients for Virtual Visits (Telemedicine).  Patients are able to view lab/test results, encounter notes, upcoming appointments, etc.  Non-urgent messages can be sent to your provider as well.   To learn more about what you can do with MyChart, go to [email or web address].    Your next appointment:   2 month(s)  The format for your next appointment:   In Person  Provider:   You will see one of the following Advanced Practice Providers on your designated Care Team:   Tommye Standard, [place]   Other Instructions

## 2021-11-23 NOTE — Progress Notes (Signed)
Cardiology Office Note Date:  11/23/2021  Patient ID:  [name], DOB Feb 23, 1951, MRN [id number] PCP:  Eulis Foster, MD  Cardiologist:  Dr. Marlou Porch EP: Dr. Curt Bears    Chief Complaint: device visit  History of Present Illness: [name] is a 70 y.o. female with history of HTN, DM (type I), HLD, thyroid cancer s/p thyroidectomy, stroke (2016 and 2019), GERD, normal coronaries by cath in 2013, HFpEF, significant atrial ectopy, and via loop found to have AFib. >> symptomatic bradycardia >> PPM   She comes in today to be seen for Dr. Curt Bears, last seen by him July 2022, doing well, no changes were made  More recently she saw [name], PA-C 11/14/21, she was generally doing well from a cardiac perspective, she had a fall recently with some fractured ribs, mobilizing slowly, discussed some personal concerns/burdening family, though all in all doing ok Using her BIPAP at night, no changes were made.  TODAY No changes since she saw Tessa the other day No new symptoms or concerns No near syncope or syncope No bleeding or signs of bleeding  Device information MDT ILR, implanted 12/23/2018 (remains implanted) MDT dual chamber PPM implanted 06/25/20   Past Medical History:  Diagnosis Date   Abnormal echocardiogram    Anemia    Arthritis    BPPV (benign paroxysmal positional vertigo), right 06/13/2017   Bradycardia 03/07/2020   Cerebrovascular accident (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 ([place]) 10/29/2015   CHB (complete heart block) ([place]) 03/08/2020   CHF (congestive heart failure) ([place])    Closed fracture of left proximal humerus 07/08/2019   Decreased range of motion of left shoulder 09/03/2020   Depression    Dizzy 03/07/2020   bradycardia   Edentulous 09/03/2020   GERD (gastroesophageal reflux disease)    Hemiparesis affecting left side as late effect of cerebrovascular accident (CVA) ([place]) 03/08/2020   Hemorrhoids    History  of Falls with injury 03/08/2020   Fall resulting in left humeral fracture   Hyperlipidemia    Hypertension    Hypothyroidism    Intracranial vascular stenosis    Lacunar infarction ([place])    Brain MRI 07/2021 multiple small remote pontine lacunar infarcts   Near syncope 03/08/2020   Paroxysmal atrial fibrillation (HCC)    Presence of permanent cardiac pacemaker    Proteinuria 03/08/2020   Seasonal allergies    Stroke ([place])    Thyroid cancer ([place])    per pt S/p Total Thyroidectomy with Radioactive Iodine Therapy   Type 1 diabetes mellitus ([place])     Past Surgical History:  Procedure Laterality Date   ABDOMINAL ANGIOGRAM N/A 05/14/2012   Procedure: ABDOMINAL ANGIOGRAM;  Surgeon: Laverda Page, MD;  Location: [facility] CATH LAB;  Service: Cardiovascular;  Laterality: N/A;   ABDOMINAL HYSTERECTOMY  1995   partial   ANKLE FRACTURE SURGERY Right    CARDIAC CATHETERIZATION     with coronary angiogram   cataract  Bilateral 2018   COLONOSCOPY N/A 09/09/2014   Procedure: COLONOSCOPY;  Surgeon: Gatha Mayer, MD;  Location: [facility];  Service: Endoscopy;  Laterality: N/A;   ESOPHAGOGASTRODUODENOSCOPY (EGD) WITH PROPOFOL N/A 12/03/2018   Procedure: ESOPHAGOGASTRODUODENOSCOPY (EGD) WITH PROPOFOL;  Surgeon: Doran Stabler, MD;  Location: [facility];  Service: Gastroenterology;  Laterality: N/A;   LEFT AND RIGHT HEART CATHETERIZATION WITH CORONARY ANGIOGRAM N/A 05/14/2012   Procedure: LEFT AND RIGHT HEART CATHETERIZATION WITH CORONARY ANGIOGRAM;  Surgeon: Laverda Page, MD;  Location:  [facility] CATH LAB;  Service: Cardiovascular;  Laterality: N/A;   LOOP RECORDER INSERTION N/A 12/23/2018   Procedure: LOOP RECORDER INSERTION;  Surgeon: Constance Haw, MD;  Location: [facility] CV LAB;  Service: Cardiovascular;  Laterality: N/A;   LOOP RECORDER REMOVAL N/A 06/25/2020   Procedure: LOOP RECORDER REMOVAL;  Surgeon: Constance Haw, MD;  Location: [facility] CV LAB;  Service:  Cardiovascular;  Laterality: N/A;   MINI METER   09/08/2014   ELECTRONIC INSULIN PUMP   PACEMAKER IMPLANT N/A 06/25/2020   Procedure: PACEMAKER IMPLANT;  Surgeon: Constance Haw, MD;  Location: [facility] CV LAB;  Service: Cardiovascular;  Laterality: N/A;   TEE WITHOUT CARDIOVERSION N/A 10/16/2018   Procedure: TRANSESOPHAGEAL ECHOCARDIOGRAM (TEE);  Surgeon: Buford Dresser, MD;  Location: [facility] ENDOSCOPY;  Service: Cardiovascular;  Laterality: N/A;   THYROIDECTOMY  2006   for thyroid cancer    Current Outpatient Medications  Medication Sig Dispense Refill   acetaminophen (TYLENOL) 325 MG tablet Take 2 tablets (650 mg total) by mouth every 6 (six) hours as needed for moderate pain.     amLODipine (NORVASC) 10 MG tablet Take 1 tablet (10 mg total) by mouth daily. 90 tablet 1   apixaban (ELIQUIS) 5 MG TABS tablet Take 1 tablet (5 mg total) by mouth 2 (two) times daily. 180 tablet 0   atorvastatin (LIPITOR) 80 MG tablet Take 1 tablet (80 mg total) by mouth every evening. 90 tablet 1   fexofenadine (ALLEGRA) 180 MG tablet Take 1 tablet (180 mg total) by mouth daily as needed for allergies or rhinitis. 30 tablet 0   FLUoxetine (PROZAC) 10 MG capsule Take 1 capsule (10 mg total) by mouth daily. 90 capsule 1   FLUoxetine (PROZAC) 20 MG capsule Take 1 capsule (20 mg total) by mouth daily. 90 capsule 1   gabapentin (NEURONTIN) 300 MG capsule Take 1 capsule (300 mg total) by mouth 3 (three) times daily. 90 capsule 1   insulin aspart (NOVOLOG) 100 UNIT/ML injection Inject 5 Units into the skin 3 (three) times daily before meals. For blood sugar greater than or equal to 200 10 mL 11   Insulin Human (INSULIN PUMP) SOLN Inject 1 each into the skin 3 times daily with meals, bedtime and 2 AM.     levothyroxine (SYNTHROID) 175 MCG tablet Take 1 tablet (175 mcg total) by mouth daily before breakfast. 30 tablet 0   Magnesium Hydroxide (MILK OF MAGNESIA PO) Take by mouth. give 30 cc Milk of Magnesium  p.o. x 1 dose in 24 hours as needed     Multiple Vitamins-Minerals (OCUVITE PO) Take 1 tablet by mouth daily.     nystatin (MYCOSTATIN) 100000 UNIT/ML suspension Take 5 mLs (500,000 Units total) by mouth 4 (four) times daily. 60 mL 0   pantoprazole (PROTONIX) 40 MG tablet Take 1 tablet (40 mg total) by mouth 2 (two) times daily. 60 tablet 0   polyethylene glycol powder (GLYCOLAX/MIRALAX) 17 GM/SCOOP powder Take 17 g by mouth daily. 507 g 0   potassium chloride (KLOR-CON) 10 MEQ tablet Take 1 tablet (10 mEq total) by mouth daily. 30 tablet 0   Sodium Phosphates (RA SALINE ENEMA RE) Place 1 Dose rectally as needed.     torsemide (DEMADEX) 10 MG tablet Take 1 tablet (10 mg total) by mouth daily. 30 tablet 0   No current facility-administered medications for this visit.    Allergies:   Latex   Social History:  The patient  reports that she has  never smoked. She has never used smokeless tobacco. She reports that she does not drink alcohol and does not use drugs.   Family History:  The patient's family history includes Diabetes in her maternal aunt; Early death in her father; Heart attack in her maternal grandfather and mother; Heart disease in her maternal aunt, maternal grandfather, and mother; Heart failure in her maternal grandfather; Hyperlipidemia in her father and mother; Hypertension in her father and mother; Prostate cancer in her paternal uncle; Renal Disease in her mother; Stroke in her son.  ROS:  Please see the history of present illness.   All other systems are reviewed and otherwise negative.   PHYSICAL EXAM:  VS:  There were no vitals taken for this visit. BMI: There is no height or weight on file to calculate BMI. Well nourished, well developed, in no acute distress  HEENT: normocephalic, atraumatic  Neck: no JVD, carotid bruits or masses Cardiac:  irreg; no significant murmurs, no rubs, or gallops Lungs:  CTA b/l, no wheezing, rhonchi or rales  Abd: soft, nontender MS: no  deformity or atrophy Ext: trace edema, chronic looking skin changes b/l lE Skin: warm and dry, no rash Neuro:  No gross deficits appreciated Psych: euthymic mood, full affect  PPM and ILR sites are stable, no tethering or discomfort   EKG:  not done today  PPM interroation done today and reviewed by myself:  Battery and lead measurements are good She is in/out of an Aflutter/Atach currently with SR AF burden 16.4% 2 NSVT  08/05/2021: TTE IMPRESSIONS   1. Left ventricular ejection fraction, by estimation, is 60 to 65%. The  left ventricle has normal function. The left ventricle has no regional  wall motion abnormalities. There is mild left ventricular hypertrophy.  Left ventricular diastolic parameters  are indeterminate.   2. Right ventricular systolic function is normal. The right ventricular  size is normal. There is normal pulmonary artery systolic pressure.   3. The mitral valve is normal in structure. No evidence of mitral valve  regurgitation. No evidence of mitral stenosis.   4. The aortic valve is tricuspid. Aortic valve regurgitation is not  visualized. Mild to moderate aortic valve sclerosis/calcification is  present, without any evidence of aortic stenosis.   5. The inferior vena cava is normal in size with greater than 50%  respiratory variability, suggesting right atrial pressure of 3 mmHg.   Comparison(s): No significant change from prior study. Prior images  reviewed side by side.    CT CORONARY MORPH W/CTA COR W/SCORE W/CA W/CM &/OR WO/CM Addendum Date: 03/10/2020   IMPRESSION:  1. 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 CT FFR will be performed and reported separately. 2. The patient's coronary artery calcium score is 1371, which places the patient in the 99th percentile. Motion artifact from arrhythmia impacts the quantitation of coronary calcium which maybe overestimated. 3. Normal coronary  origin with right dominance. 4. Poor contrast opacification of the tip of the left atrial appendage. Cannot exclude LA appendage thrombus vs incomplete contrast filling  Echocardiogram 03/08/2020 IMPRESSIONS   1. Left ventricular ejection fraction, by estimation, is 60 to 65%. Left  ventricular ejection fraction by PLAX is 63 %. The left ventricle has  normal function. The left ventricle has no regional wall motion  abnormalities. Left ventricular diastolic  parameters are indeterminate.   2. Right ventricular systolic function is normal. The right ventricular  size is normal.  There is moderately elevated pulmonary artery systolic  pressure.   3. Left atrial size was mildly dilated.   4. The mitral valve is normal in structure. Mild mitral valve  regurgitation. No evidence of mitral stenosis.   5. Tricuspid valve regurgitation is mild to moderate.   6. The aortic valve is tricuspid. Aortic valve regurgitation is not  visualized. No aortic stenosis is present.   7. The inferior vena cava is dilated in size with >50% respiratory  variability, suggesting right atrial pressure of 8 mmHg.      10/14/18: TTE Study Conclusions - Procedure narrative: Transthoracic echocardiography. Technically   difficult study. Intravenous contrast (Definity) was   administered. - Left ventricle: The cavity size was normal. There was moderate   concentric hypertrophy. Systolic function was normal. The   estimated ejection fraction was in the range of 60% to 65%. Wall   motion was normal; there were no regional wall motion   abnormalities. The study is not technically sufficient to allow   evaluation of LV diastolic function. - Left atrium: The atrium was normal in size. - Right atrium: The atrium was normal in size. Impressions: - Technically difficult study. Afib is noted. LVEF 60-65%, moderate   LVH, normal wall motion, normal biatrial size.   Recent Labs: 08/04/2021: Magnesium 1.8; TSH  1.610 10/11/2021: B Natriuretic Peptide 201.5 10/31/2021: ALT 19; Hemoglobin 12.7; Platelets 163 11/16/2021: BUN 17; Creatinine, Ser 1.41; Potassium 4.3; Sodium 142  08/04/2021: Cholesterol 102; HDL 44; LDL Cholesterol 50; Total CHOL/HDL Ratio 2.3; Triglycerides 39; VLDL 8   CrCl cannot be calculated (Unknown ideal weight.).   Wt Readings from Last 3 Encounters:  10/27/21 250 lb 12.8 oz (113.8 kg)  10/21/21 249 lb 9.6 oz (113.2 kg)  10/13/21 263 lb (119.3 kg)     Other studies reviewed: Additional studies/records reviewed today include: summarized above  ASSESSMENT AND PLAN:  1. Paroxysmal AFib     CHA2DS2asc is 10, on Eliquis, appropriately dosed     She is unaware of the AFlutter/Atach today, V rates controlled paced and conducted  Resume Toprol, reduce amlodipine   2. PPM Intact function No programming changes made  3. CAD     known branch vessel disease     Cards recs in the hospital were to start amlodipine if more CP  4. HTN     Looks good, meds as above   Disposition: I will have her back in a few months, revisit A rhythm, perhaps transition to toprol alone .     Current medicines are reviewed at length with the patient today.  The patient did not have any concerns regarding medicines  Signed, Tommye Standard, PA-C 11/23/2021 6:04 AM     [address] [place] [id number] [phone] (office)  [phone] (fax)

## 2021-11-23 NOTE — Telephone Encounter (Signed)
I called and LMVM for pt re: message below.  (Also relayed sent to mychart as well.) She is to call us back.

## 2021-11-23 NOTE — Telephone Encounter (Signed)
I called the patient and LVM asking for call back to schedule her for an appointment to discuss other options. I advised that right now we have an opening on December 5th. Left office number for call back.

## 2021-11-23 NOTE — Telephone Encounter (Signed)
This patient called into the referrals line, stated she was speaking with someone at our office and they got cut off. The only information I could find regarding a call was yesterday to offer an appointment with Dr Rexene Alberts on 12/5. I offered that appointment to the patient and she stated that she no longer wished to come to any further appointments at our office. I asked if she wanted to speak with a nurse and she said she did not and disconnected the call.

## 2021-11-23 NOTE — Telephone Encounter (Signed)
Pt was informed that she can be scheduled to be seen in the office and discuss her sleep apnea and the pt stated the the DME fixed the problem she was having with her mask. Pt was informed that she would need to be seen at least once a year in order for her to continue to get supplies and pt refused stating she is not coming in.

## 2021-11-24 ENCOUNTER — Telehealth: Payer: Self-pay | Admitting: Physician Assistant

## 2021-11-24 NOTE — Telephone Encounter (Signed)
Langley Gauss, doesn't think patient has been taking torsemide (DEMADEX) 10 MG tablet, as prescribed.  She just wanted to give [place] a heads up, as it wasn't in her pill planner.  If we have any questions we can reach [place] at [phone].

## 2021-11-24 NOTE — Telephone Encounter (Signed)
I spoke with Langley Gauss. She reports when she saw patient torsemide was not in her pill holder.  They were able to locate the bottle and Denise instructed the patient to resume torsemide. Langley Gauss will see the patient again next week.

## 2021-11-28 ENCOUNTER — Ambulatory Visit (HOSPITAL_COMMUNITY)
Admission: RE | Admit: 2021-11-28 | Discharge: 2021-11-28 | Disposition: A | Discharge status: Home / Self Care | Payer: Medicare PPO | Source: Ambulatory Visit | Attending: Family Medicine | Admitting: Family Medicine

## 2021-11-28 ENCOUNTER — Telehealth: Payer: Self-pay

## 2021-11-28 ENCOUNTER — Other Ambulatory Visit: Payer: Self-pay

## 2021-11-28 DIAGNOSIS — R498 Other voice and resonance disorders: Secondary | ICD-10-CM

## 2021-11-28 DIAGNOSIS — R1310 Dysphagia, unspecified: Secondary | ICD-10-CM | POA: Insufficient documentation

## 2021-11-28 NOTE — Telephone Encounter (Signed)
Patient calls nurse line requesting alternative medication for oral thrush. Patient reports completely course of nystatin liquid solution, however, is still having white patches on tongue and mouth.   Please advise.   Talbot Grumbling, RN

## 2021-11-28 NOTE — Progress Notes (Signed)
Modified Barium Swallow Progress Note  Patient Details  Name: [name] MRN: [id number] Date of Birth: 07/01/1951  Today's Date: 11/28/2021  Modified Barium Swallow completed.  Full report located under Chart Review in the Imaging Section.  Brief recommendations include the following:  Clinical Impression  Pt presents with a mild oropharyngeal dysphagia. Her oral phase is a little prolonged particularly with purees and solids, which also leave mild lingual residue. She takes her time and uses a spontaneous second swallow to clear oral residuals, which likely makes her oral phase relatively functional with the soft diet she says she prefers. She also has intermittent premature spillage with thin and nectar thick liquids with impaired timing that can lead to silent aspiration. Thin liquids are more consistently aspirated before the swallow, while nectar thick liquids are aspirated when consumed via straw or x1 during a large cup sip. When cued to take very small sips of nectar thick liquids, this eliminates further aspiration. She can better contain them in her valleculae before the swallow when cued to hold the bolus in her mouth for a second before she swallows as well. This strategy does not work with thin liquids though and she has significant difficulty implementing a chin tuck, so this is not an effective strategy as she could not eliminate aspiration with thin liquids. Recommend considering Dys 3 (mechanical soft) diet and nectar thick liquids by cup as an attempt at reducing aspiration risk, although would recommend ongoing f/u wtih [facility] SLP. Note that pt denies any h/o PNA.   Swallow Evaluation Recommendations       SLP Diet Recommendations: Dysphagia 3 (Mech soft) solids;Nectar thick liquid   Liquid Administration via: Cup;No straw   Medication Administration: Whole meds with puree   Supervision: Patient able to self feed   Compensations: Slow rate;Small  sips/bites;Other (Comment) (hold bolus in mouth before swallowing)   Postural Changes: Seated upright at 90 degrees   Oral Care Recommendations: Oral care BID        Osie Bond., M.A. [facility] Pager [phone] Office [phone]  11/28/2021,3:45 PM

## 2021-11-29 ENCOUNTER — Other Ambulatory Visit: Payer: Self-pay | Admitting: Family Medicine

## 2021-11-29 MED ORDER — FLUCONAZOLE 200 MG PO TABS
200.0000 mg | ORAL_TABLET | Freq: Every day | ORAL | 0 refills | Status: AC
Start: 2021-11-29 — End: 2021-12-09

## 2021-11-29 NOTE — Telephone Encounter (Signed)
RX sent for oral fluconazole 200mg  daily for 10 day course.   Eulis Foster, MD [place], PGY-3 [phone]

## 2021-11-30 ENCOUNTER — Other Ambulatory Visit: Payer: Self-pay | Admitting: Family Medicine

## 2021-12-01 DIAGNOSIS — E1065 Type 1 diabetes mellitus with hyperglycemia: Secondary | ICD-10-CM | POA: Diagnosis not present

## 2021-12-02 NOTE — Telephone Encounter (Signed)
Patient calls nurse reporting she is not getting good care here. Patient reports no one returns her phone calls or addresses her issues.   I advised patient medication was sent in on her behalf on 12/6 after discussing with nurse on 12/5.  Patient continued to act inappropriately talking over me and screaming, "YALL DO NOT DO YOUR JOB."   Patient disconnected the call. I called her pharmacy to make sure medication was ready for pick up. Confirmed with tech medication is ready and for 3 dollars.   Called patient back to advise of this. Which she hung up on me again.   If patient calls about medication. Please tell her it is ready at the pharmacy.

## 2021-12-06 ENCOUNTER — Telehealth: Payer: Self-pay

## 2021-12-06 NOTE — Telephone Encounter (Signed)
Received phone call from [place] Therapist with [facility] regarding concerns for patient.   Reports that patient was reporting SI during visit today. Patient stated " I want things to end and I am a burden to my family"  Called patient to check in. Patient states that she is not having suicidal thoughts, however, she feels that she is a burden to family. Patient states, "I have strong faith in God and when it is my time, it is my time"   Attempted to provide with resources for behavioral health and suicide hotline. Patient states that she is uninterested and would prefer to speak to PCP.   - Spoke with Dr. Hartford Poli regarding patient. Will forward to PCP for next steps.   Talbot Grumbling, RN

## 2021-12-07 MED ORDER — BUSPIRONE HCL 5 MG PO TABS: 5.0000 mg | ORAL_TABLET | Freq: Three times a day (TID) | ORAL | 2 refills | Status: DC

## 2021-12-07 NOTE — Telephone Encounter (Signed)
Called patient to f/u on report of suicidal thoughts.  Patient emphasized that she sometimes gets discouraged with her health burden.   She states that she still feels down despite taking Prozac. She states that she wishes she could walk again. She reports that she meant to say "if it's my time [to die] then it's my time. I am not going to hurt myself" She mentions that she feels a burden which is stressful to not be able to fully take care of herself. She states that she loves her family (protective factors). states that she would like to take a different anti-depressant but does not remember what medication she was on previously.   Offered PRN Buspar to which she was agreeable. Prescribed 5mg  TID PRN. She states that she would like to speak with someone regarding therapy but states she would like to be with someone who would not be a financial burden for every visit as her income is limited.   Eulis Foster, MD [place], PGY-3 [phone]

## 2021-12-09 ENCOUNTER — Telehealth: Payer: Self-pay | Admitting: Family Medicine

## 2021-12-09 ENCOUNTER — Telehealth: Payer: Self-pay | Admitting: *Deleted

## 2021-12-09 DIAGNOSIS — F39 Unspecified mood [affective] disorder: Secondary | ICD-10-CM

## 2021-12-09 DIAGNOSIS — R45851 Suicidal ideations: Secondary | ICD-10-CM

## 2021-12-09 NOTE — Chronic Care Management (AMB) (Signed)
°  Care Management   Note  12/09/2021 Name: Samiyyah Moffa MRN: [id number] DOB: 11/01/1951  [name] Alexzandrea Normington is a 70 y.o. year old female who is a primary care patient of [name], [name], MD and is actively engaged with the care management team. I reached out to [name] by phone today to assist with scheduling an initial visit with the RN Case Manager  Follow up plan: Unsuccessful telephone outreach attempt made. A HIPAA compliant phone message was left for the patient providing contact information and requesting a return call.  The care management team will reach out to the patient again over the next 7 days.  If patient returns call to provider office, please advise to call [place] at [facility]  Direct Dial: [phone]

## 2021-12-09 NOTE — Chronic Care Management (AMB) (Signed)
°  Care Management   Note  12/09/2021 Name: [name] MRN: [id number] DOB: [birth year]  [name] is a 70 y.o. year old female who is a primary care patient of [name], [name], MD and is actively engaged with the care management team. I reached out to [name] by phone today to assist with scheduling an initial visit with the RN Case Manager  Follow up plan: Telephone appointment with care management team member scheduled for:12/14/21  [facility]  Direct Dial: [phone]

## 2021-12-09 NOTE — Telephone Encounter (Signed)
-----   Message from Maurine Cane, LCSW sent at 12/08/2021  8:10 AM EST ----- Regarding: therapy option s Have worked with Ms Kimbell over the years with managing symptoms of depression. Her insurance co-pay is $20.00   she says she cannot afford to pay that.  You can place a referral to [facility] if she is willing to pay the co-pay.  I connected her with all of the free resources however since she has insurance that makes if difficulty.  One agency was able to work with her for 6 months but she has already tapped into that.  Other options are agencies with sliding scale but the sliding scale fee is more than the the $20.00.       I am unable to provide therapy for her. Not sure of any other options    Casimer Lanius, [place] / [place]   [phone]

## 2021-12-09 NOTE — Telephone Encounter (Signed)
Contacted patient via telephone to follow up on SI discussed during previous call. Discussed recommendation for therapy to which patient continues to express interest in as long as sessions will be affordable. As recommended by LCSW, Casimer Lanius, reminded Ms. Zanni of the $20 co pay if she is willing to establish care with [employer] health. Patient states that this is not too bad and would like to proceed with the referral.   Referral placed.   Eulis Foster, MD [place], PGY-3 [phone]

## 2021-12-12 ENCOUNTER — Ambulatory Visit: Payer: Self-pay | Admitting: Licensed Clinical Social Worker

## 2021-12-12 DIAGNOSIS — F39 Unspecified mood [affective] disorder: Secondary | ICD-10-CM

## 2021-12-12 DIAGNOSIS — G3184 Mild cognitive impairment, so stated: Secondary | ICD-10-CM

## 2021-12-12 NOTE — Patient Instructions (Signed)
   Patient was not contacted during this encounter.  LCSW collaborated with care team to accomplish patient's care plan goal   Monisha Siebel, LCSW Care Management & Coordination  [phone]  

## 2021-12-12 NOTE — Chronic Care Management (AMB) (Signed)
°  Care Management  Collaboration  Note  12/12/2021 Name: [name] MRN: [id number] DOB: 21-Jul-1951  [name] [name] is a 70 y.o. year old female who is a primary care patient of [name], [name], MD. The CCM team was consulted reference care coordination needs for  counseling resources .  Assessment: Patient was not interviewed or contacted during this encounter.  Patient has expressed to PCP she would like to start counseling. PCP placed referral to [name].  Patient may also benefit from being followed by CCM RN to assist with managing chronic conditions.    Intervention:Conducted brief assessment, recommendations and relevant information discussed.  CCM LCSW collaborated with CCM RN and PCP  to assist with meeting patient's needs.    Follow up Plan:  Patient does not require continued follow-up by CCM LCSW.  CCM LCSW will disconnect from patient's care team at this time, but will be available at any time they would like to re-engage for care coordination services.  CCM RN will attempt to connect with patient   Review of patient past medical history, allergies, medications, and health status, including review of pertinent consultant reports was performed as part of comprehensive evaluation and provision of care management/care coordination services.   No Care Plan Established   Casimer Lanius, [place] / [place]   [phone]

## 2021-12-13 DIAGNOSIS — I10 Essential (primary) hypertension: Secondary | ICD-10-CM | POA: Diagnosis not present

## 2021-12-13 DIAGNOSIS — C73 Malignant neoplasm of thyroid gland: Secondary | ICD-10-CM | POA: Diagnosis not present

## 2021-12-13 DIAGNOSIS — I639 Cerebral infarction, unspecified: Secondary | ICD-10-CM | POA: Diagnosis not present

## 2021-12-13 DIAGNOSIS — E109 Type 1 diabetes mellitus without complications: Secondary | ICD-10-CM | POA: Diagnosis not present

## 2021-12-13 DIAGNOSIS — E890 Postprocedural hypothyroidism: Secondary | ICD-10-CM | POA: Diagnosis not present

## 2021-12-13 DIAGNOSIS — Z9641 Presence of insulin pump (external) (internal): Secondary | ICD-10-CM | POA: Diagnosis not present

## 2021-12-13 DIAGNOSIS — Z794 Long term (current) use of insulin: Secondary | ICD-10-CM | POA: Diagnosis not present

## 2021-12-13 DIAGNOSIS — E7800 Pure hypercholesterolemia, unspecified: Secondary | ICD-10-CM | POA: Diagnosis not present

## 2021-12-13 DIAGNOSIS — E11319 Type 2 diabetes mellitus with unspecified diabetic retinopathy without macular edema: Secondary | ICD-10-CM | POA: Diagnosis not present

## 2021-12-14 ENCOUNTER — Telehealth: Payer: Medicare PPO

## 2021-12-14 ENCOUNTER — Telehealth: Payer: Self-pay

## 2021-12-14 NOTE — Telephone Encounter (Signed)
° °  RN Case Manager Care Management   Phone Outreach    12/14/2021 Name: [name] MRN: [id number] DOB: 21-Jul-1951  [name] is a 70 y.o. year old female who is a primary care patient of [name], [name], MD .   Telephone outreach was unsuccessful A HIPPA compliant phone message was left for the patient providing contact information and requesting a return call.   Follow Up Plan: Will route chart to Care Guide to see if patient would like to reschedule phone appointment    Review of patient status, including review of consultants reports, relevant laboratory and other test results, and collaboration with appropriate care team members and the patient's provider was performed as part of comprehensive patient evaluation and provision of care management services.    Lazaro Arms RN, BSN, [facility] Care Management Coordinator [place] Phone: [phone] Fax: [phone]             .

## 2021-12-21 ENCOUNTER — Telehealth: Payer: Self-pay | Admitting: *Deleted

## 2021-12-21 NOTE — Chronic Care Management (AMB) (Signed)
°  Care Management   Note  12/21/2021 Name: Olivianna Higley MRN: [id number] DOB: [birth year]  [name] Nakeeta Sebastiani is a 70 y.o. year old female who is a primary care patient of [name], [name], MD and is actively engaged with the care management team. I reached out to [name] by phone today to assist with re-scheduling an initial visit with the RN Case Manager  Follow up plan: Unsuccessful telephone outreach attempt made. A HIPAA compliant phone message was left for the patient providing contact information and requesting a return call.  The care management team will reach out to the patient again over the next 7 days.  If patient returns call to provider office, please advise to call [place] at [phone].  [place] Management  Direct Dial: [phone]

## 2021-12-23 ENCOUNTER — Other Ambulatory Visit: Payer: Self-pay

## 2021-12-23 ENCOUNTER — Encounter: Payer: Self-pay | Admitting: Podiatry

## 2021-12-23 ENCOUNTER — Ambulatory Visit: Payer: Medicare PPO | Admitting: Podiatry

## 2021-12-23 DIAGNOSIS — M79675 Pain in left toe(s): Secondary | ICD-10-CM | POA: Diagnosis not present

## 2021-12-23 DIAGNOSIS — M79674 Pain in right toe(s): Secondary | ICD-10-CM | POA: Diagnosis not present

## 2021-12-23 DIAGNOSIS — D689 Coagulation defect, unspecified: Secondary | ICD-10-CM

## 2021-12-23 DIAGNOSIS — E1159 Type 2 diabetes mellitus with other circulatory complications: Secondary | ICD-10-CM

## 2021-12-23 DIAGNOSIS — B351 Tinea unguium: Secondary | ICD-10-CM

## 2021-12-23 NOTE — Progress Notes (Signed)
This patient returns to my office for at risk foot care.  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  This patient is unable to cut nails herself  since the patient cannot reach her  nails.These nails are painful walking and wearing shoes.  Patient presents in a wheelchair .  This patient presents for at risk foot care today.  General Appearance  Alert, conversant and in no acute stress.  Vascular  Dorsalis pedis and posterior tibial  pulses are weakly  palpable  bilaterally.  Capillary return is within normal limits  bilaterally. Temperature is within normal limits  bilaterally.  Neurologic  Senn-Weinstein monofilament wire diminished  bilaterally. Muscle power within normal limits bilaterally.  Nails Thick disfigured discolored nails with subungual debris  from hallux to fifth toes bilaterally. No evidence of bacterial infection or drainage bilaterally.  Orthopedic  No limitations of motion  feet .  No crepitus or effusions noted.  No bony pathology or digital deformities noted.  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  Skin  normotropic skin with no porokeratosis noted bilaterally.  No signs of infections or ulcers noted.     Onychomycosis  Pain in right toes  Pain in left toes  Consent was obtained for treatment procedures.   Mechanical debridement of nails 1-5  bilaterally performed with a nail nipper.  Filed with dremel without incident. No infection or ulcer.     Return office visit    3 months       Told patient to return for periodic foot care and evaluation due to potential at risk complications.   Gardiner Barefoot DPM

## 2021-12-29 ENCOUNTER — Encounter: Payer: Self-pay | Admitting: Physical Medicine & Rehabilitation

## 2021-12-29 ENCOUNTER — Encounter: Payer: Medicare PPO | Attending: Physical Medicine & Rehabilitation | Admitting: Physical Medicine & Rehabilitation

## 2021-12-29 ENCOUNTER — Other Ambulatory Visit: Payer: Self-pay

## 2021-12-29 ENCOUNTER — Ambulatory Visit: Payer: Medicare PPO | Admitting: Physical Medicine & Rehabilitation

## 2021-12-29 VITALS — BP 110/53 | HR 69 | Temp 98.6°F | Ht 67.0 in

## 2021-12-29 DIAGNOSIS — I69354 Hemiplegia and hemiparesis following cerebral infarction affecting left non-dominant side: Secondary | ICD-10-CM | POA: Diagnosis not present

## 2021-12-29 DIAGNOSIS — I639 Cerebral infarction, unspecified: Secondary | ICD-10-CM | POA: Diagnosis not present

## 2021-12-29 NOTE — Patient Instructions (Signed)
Would practice sitting at edge of bed without back or arm support to build up core muscle strength

## 2021-12-29 NOTE — Progress Notes (Signed)
Subjective:    Patient ID: [name], female    DOB: 07/05/1951, 71 y.o.   MRN: [id number] 71 y.o. 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  Lives with relatives multilevel home.  Ambulates short distances with a Rollator.  Granddaughter assists with ADLs.  Presented 08/04/2021 with increasing left-sided weakness and worsening dysarthria.  CT/MRI showed small acute infarct in the left pons no significant edema or mass-effect.  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  Remote pontine microhemorrhage.  Echocardiogram with ejection fraction of 60 to 65% no wall motion abnormalities.  Admission chemistries unremarkable.  Patient did not receive tPA.  Neurology follow-up maintained on Eliquis as prior to admission.  [name] did have an insulin pump utilized for diabetes.  Wound care nurse follow-up 08/05/2021 for left heel and right shin wound with skin care as directed.  Tolerating a regular consistency diet.  Therapy evaluations completed due to patient's left-sided weakness dysarthric speech was admitted for a comprehensive rehab program  Admit date: 08/10/2021 Discharge date: 08/30/2021 HPI Patient returns today asking about how to walk better.  [name] shows a picture of a walker that has a harness support that [name] is now using at home.  [name] used to walk with a standard Rollator walker. HHA still coming out Finished with home health PT, OT Last  ambulated with walker a couple weeks ago  Pain Inventory Average Pain 0 Pain Right Now 0 My pain is  No pain  LOCATION OF PAIN  No pain  BOWEL Number of stools per  week: 2-3 a week Oral laxative use Yes  Type of laxative Miralax Enema or suppository use No  History of colostomy No  Incontinent No   BLADDER Pads In and out cath, frequency n/a Able to self cath No  Bladder incontinence Yes  Frequent urination No  Leakage with coughing Yes  Difficulty starting stream No  Incomplete bladder emptying No    Mobility use a walker how many minutes can you walk? Unknown with walker ability to climb steps?  no do you drive?  no use a wheelchair needs help with transfers Do you have any goals in this area?  yes  Function retired I need assistance with the following:  dressing, bathing, toileting, meal prep, household duties, and shopping Do you have any goals in this area?  yes  Neuro/Psych bladder control problems weakness numbness tingling trouble walking  Prior Studies Any changes since last visit?  no  Physicians involved in your care Any changes since last visit?  no   Family History  Problem Relation Age of Onset   Heart disease Mother    Hyperlipidemia Mother    Hypertension Mother    Renal Disease Mother        insufficiency   Heart attack Mother        in his 36's   Early death Father        Head Injury at Work   Hyperlipidemia Father    Hypertension Father    Stroke Son    Diabetes Maternal Aunt    Prostate cancer Paternal Uncle    Heart disease Maternal Aunt    Heart failure Maternal Grandfather    Heart disease  Maternal Grandfather    Heart attack Maternal Grandfather        Died in his 35's   Breast cancer Neg Hx    Social History   Socioeconomic History   Marital status: Divorced    Spouse name: Not on file   Number of children: 1   Years of education: Not on file   Highest education level: Not on file  Occupational History   Occupation: retired Pharmacist, hospital  Tobacco Use   Smoking status: Never   Smokeless tobacco: Never  Vaping Use   Vaping Use: Never used  Substance and Sexual Activity    Alcohol use: No   Drug use: No   Sexual activity: Not on file  Other Topics Concern   Not on file  Social History Narrative   ** Merged History Encounter **       Retired Pharmacist, hospital, high school teacher taught mass. One son, helping to care for her granddaughter. No caffeine. Updated 07/20/2014.   Social Determinants of Health   Financial Resource Strain: Not on file  Food Insecurity: No Food Insecurity   Worried About Charity fundraiser in the Last Year: Never true   Ran Out of Food in the Last Year: Never true  Transportation Needs: Not on file  Physical Activity: Not on file  Stress: Not on file  Social Connections: Not on file   Past Surgical History:  Procedure Laterality Date   ABDOMINAL ANGIOGRAM N/A 05/14/2012   Procedure: ABDOMINAL ANGIOGRAM;  Surgeon: Laverda Page, MD;  Location: [facility] CATH LAB;  Service: Cardiovascular;  Laterality: N/A;   ABDOMINAL HYSTERECTOMY  1995   partial   ANKLE FRACTURE SURGERY Right    CARDIAC CATHETERIZATION     with coronary angiogram   cataract  Bilateral 2018   COLONOSCOPY N/A 09/09/2014   Procedure: COLONOSCOPY;  Surgeon: Gatha Mayer, MD;  Location: [facility];  Service: Endoscopy;  Laterality: N/A;   ESOPHAGOGASTRODUODENOSCOPY (EGD) WITH PROPOFOL N/A 12/03/2018   Procedure: ESOPHAGOGASTRODUODENOSCOPY (EGD) WITH PROPOFOL;  Surgeon: Doran Stabler, MD;  Location: [place];  Service: Gastroenterology;  Laterality: N/A;   LEFT AND RIGHT HEART CATHETERIZATION WITH CORONARY ANGIOGRAM N/A 05/14/2012   Procedure: LEFT AND RIGHT HEART CATHETERIZATION WITH CORONARY ANGIOGRAM;  Surgeon: Laverda Page, MD;  Location: [facility] CATH LAB;  Service: Cardiovascular;  Laterality: N/A;   LOOP RECORDER INSERTION N/A 12/23/2018   Procedure: LOOP RECORDER INSERTION;  Surgeon: Constance Haw, MD;  Location: [place] CV LAB;  Service: Cardiovascular;  Laterality: N/A;   LOOP RECORDER REMOVAL N/A 06/25/2020   Procedure: LOOP RECORDER  REMOVAL;  Surgeon: Constance Haw, MD;  Location: [place] CV LAB;  Service: Cardiovascular;  Laterality: N/A;   MINI METER   09/08/2014   ELECTRONIC INSULIN PUMP   PACEMAKER IMPLANT N/A 06/25/2020   Procedure: PACEMAKER IMPLANT;  Surgeon: Constance Haw, MD;  Location: [place] CV LAB;  Service: Cardiovascular;  Laterality: N/A;   TEE WITHOUT CARDIOVERSION N/A 10/16/2018   Procedure: TRANSESOPHAGEAL ECHOCARDIOGRAM (TEE);  Surgeon: Buford Dresser, MD;  Location: [facility] ENDOSCOPY;  Service: Cardiovascular;  Laterality: N/A;   THYROIDECTOMY  2006   for thyroid cancer   Past Medical History:  Diagnosis Date   Abnormal echocardiogram    Anemia    Arthritis    BPPV (benign paroxysmal positional vertigo), right 06/13/2017   Bradycardia 03/07/2020   Cerebrovascular accident (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 ([place]) 10/29/2015   CHB (complete heart block) ([place])  03/08/2020   CHF (congestive heart failure) ([place])    Closed fracture of left proximal humerus 07/08/2019   Decreased range of motion of left shoulder 09/03/2020   Depression    Dizzy 03/07/2020   bradycardia   Edentulous 09/03/2020   GERD (gastroesophageal reflux disease)    Hemiparesis affecting left side as late effect of cerebrovascular accident (CVA) ([place]) 03/08/2020   Hemorrhoids    History of Falls with injury 03/08/2020   Fall resulting in left humeral fracture   Hyperlipidemia    Hypertension    Hypothyroidism    Intracranial vascular stenosis    Lacunar infarction ([place])    Brain MRI 07/2021 multiple small remote pontine lacunar infarcts   Near syncope 03/08/2020   Paroxysmal atrial fibrillation (HCC)    Presence of permanent cardiac pacemaker    Proteinuria 03/08/2020   Seasonal allergies    Stroke ([place])    Thyroid cancer ([place])    per pt S/p Total Thyroidectomy with Radioactive Iodine Therapy   Type 1 diabetes mellitus (HCC)    BP (!) 110/53    Pulse 69    Temp 98.6 F (37 C)    Ht  5\' 7"  (1.702 m)    SpO2 90%    BMI 39.16 kg/m   Opioid Risk Score:   Fall Risk Score:  `1  Depression screen PHQ 2/9  Depression screen [facility] 2/9 11/16/2021 09/06/2021 06/29/2021 02/15/2021 01/03/2021 08/10/2020 07/28/2020  Decreased Interest 1 3 0 0 0 0 0  Down, Depressed, Hopeless 1 2 0 0 0 0 0  PHQ - 2 Score 2 5 0 0 0 0 0  Altered sleeping 0 0 0 0 3 0 1  Tired, decreased energy 1 1 0 0 0 0 0  Change in appetite 1 0 0 0 0 0 0  Feeling bad or failure about yourself  2 1 0 0 0 0 0  Trouble concentrating 1 3 0 0 0 0 0  Moving slowly or fidgety/restless 1 0 0 0 0 0 0  Suicidal thoughts 2 0 0 0 0 0 0  PHQ-9 Score 10 10 0 0 3 0 1  Difficult doing work/chores Somewhat difficult Somewhat difficult - - - - -  Some recent data might be hidden    Review of Systems  Genitourinary:        Bladder leakage  Musculoskeletal:  Positive for gait problem.  Neurological:  Positive for weakness and numbness.  All other systems reviewed and are negative.     Objective:   Physical Exam Vitals reviewed.  Constitutional:      Appearance: [name] is obese.  HENT:     Head: Normocephalic and atraumatic.  Eyes:     Extraocular Movements: Extraocular movements intact.     Conjunctiva/sclera: Conjunctivae normal.     Pupils: Pupils are equal, round, and reactive to light.  Neurological:     Mental Status: [name] is alert and oriented to person, place, and time.  Psychiatric:        Mood and Affect: Mood normal.        Behavior: Behavior normal.   Motor strength is 4/5 in the left deltoid bicep tricep grip hip flexor knee extensor ankle dorsiflexor 5/5 in the right deltoid bicep tricep grip hip flexor knee extensor ankle dorsiflexor Sensation intact to light touch in the upper limbs. Ambulation not tested Patient's truncal strength is diminished [name] tends to lean backward in her chair [name] has difficulty keeping upright posture.  Assessment & Plan:  1.  History of bilateral pontine infarcts.  [name]  has had good recovery of strength in the right side from the most recent stroke.  [name] does have close chronic mild to moderate left-sided weakness and motor control issues.  [name] does have decreased core strength which I believe is hindering her ambulation with a walker.  We discussed some core strengthening exercises.  Patient's granddaughter found some exercises which we reviewed and they appear to be appropriate.  They will educate CNA regarding this.  I will see the patient back in 2 months if not making any progress in this regard would recommend some formalized home health PT Low complexity medical decision

## 2021-12-30 ENCOUNTER — Telehealth: Payer: Self-pay | Admitting: Cardiology

## 2021-12-30 NOTE — Chronic Care Management (AMB) (Signed)
°  Care Management   Note  12/30/2021 Name: Quina Wilbourne MRN: [id number] DOB: [birth year]  [name] [name] is a 71 y.o. year old female who is a primary care patient of [name], [name], MD and is actively engaged with the care management team. I reached out to [name] by phone today to assist with re-scheduling an initial visit with the RN Case Manager  Follow up plan: Unsuccessful telephone outreach attempt made. A HIPAA compliant phone message was left for the patient providing contact information and requesting a return call.  The care management team will reach out to the patient again over the next 7 days.  If patient returns call to provider office, please advise to call [place] at [phone].  [place] Management  Direct Dial: [phone]

## 2021-12-30 NOTE — Telephone Encounter (Signed)
Spoke with Mariann Laster at [facility]. She states that they received verbal orders on 12/01 for medications, however they still need written orders with the providers signature. She states that the patient was discharged from home health on 12/22. They need these orders to be signed in order to close out her chart.

## 2021-12-30 NOTE — Telephone Encounter (Signed)
Follow Up:    Checking on the status of there order  sent, the last order was sent yesterday,

## 2021-12-30 NOTE — Telephone Encounter (Signed)
Spoke with [name] at [facility]. Informed that paperwork sent to [place] requesting physician signature was for a verbal order that we did not give. Further discussed that pt was seen in office and medication changes were made. Advised that we will be glad to sign the med changes if they correct the wording of requested order. She will address and fax new order if needed.

## 2022-01-05 ENCOUNTER — Ambulatory Visit (INDEPENDENT_AMBULATORY_CARE_PROVIDER_SITE_OTHER): Payer: Medicare PPO

## 2022-01-05 DIAGNOSIS — I442 Atrioventricular block, complete: Secondary | ICD-10-CM

## 2022-01-05 LAB — CUP PACEART REMOTE DEVICE CHECK
Battery Remaining Longevity: 124 mo
Battery Voltage: 3.03 V
Brady Statistic AP VP Percent: 24.71 %
Brady Statistic AP VS Percent: 12.84 %
Brady Statistic AS VP Percent: 45.43 %
Brady Statistic AS VS Percent: 17 %
Brady Statistic RA Percent Paced: 37.05 %
Brady Statistic RV Percent Paced: 71.2 %
Date Time Interrogation Session: 20230111213036
Implantable Lead Implant Date: 20210702
Implantable Lead Implant Date: 20210702
Implantable Lead Location: 753859
Implantable Lead Location: 753860
Implantable Lead Model: 5076
Implantable Lead Model: 5076
Implantable Pulse Generator Implant Date: 20210702
Lead Channel Impedance Value: 266 Ohm
Lead Channel Impedance Value: 361 Ohm
Lead Channel Impedance Value: 399 Ohm
Lead Channel Impedance Value: 437 Ohm
Lead Channel Pacing Threshold Amplitude: 0.375 V
Lead Channel Pacing Threshold Amplitude: 0.5 V
Lead Channel Pacing Threshold Pulse Width: 0.4 ms
Lead Channel Pacing Threshold Pulse Width: 0.4 ms
Lead Channel Sensing Intrinsic Amplitude: 10.25 mV
Lead Channel Sensing Intrinsic Amplitude: 10.25 mV
Lead Channel Sensing Intrinsic Amplitude: 4.375 mV
Lead Channel Sensing Intrinsic Amplitude: 4.375 mV
Lead Channel Setting Pacing Amplitude: 1.5 V
Lead Channel Setting Pacing Amplitude: 2.5 V
Lead Channel Setting Pacing Pulse Width: 0.4 ms
Lead Channel Setting Sensing Sensitivity: 1.2 mV

## 2022-01-06 NOTE — Chronic Care Management (AMB) (Signed)
°  Care Management   Note  01/06/2022 Name: [name] MRN: [id number] DOB: 11-Jun-1951  [name] [name] is a 71 y.o. year old female who is a primary care patient of [name], [name], MD and is actively engaged with the care management team. I reached out to [name] by phone today to assist with re-scheduling an initial visit with the RN Case Manager  Follow up plan: A third unsuccessful telephone outreach attempt made. A HIPAA compliant phone message was left for the patient providing contact information and requesting a return call. Unable to make contact on outreach attempts x 3. PCP [name], [name], MD notified via routed documentation in medical record. We have been unable to make contact with the patient for follow up. The care management team is available to follow up with the patient after provider conversation with the patient regarding recommendation for care management engagement and subsequent re-referral to the care management team. If patient returns call to provider office, please advise to call [place] at [phone].  [place] Management  Direct Dial: [phone]

## 2022-01-15 ENCOUNTER — Other Ambulatory Visit: Payer: Self-pay | Admitting: Family Medicine

## 2022-01-17 NOTE — Progress Notes (Signed)
Remote pacemaker transmission.   

## 2022-01-20 ENCOUNTER — Encounter: Payer: Self-pay | Admitting: Physician Assistant

## 2022-01-20 ENCOUNTER — Ambulatory Visit: Payer: Medicare PPO | Admitting: Physician Assistant

## 2022-01-20 ENCOUNTER — Other Ambulatory Visit: Payer: Self-pay

## 2022-01-20 VITALS — BP 112/68 | HR 49 | Ht 67.0 in | Wt 250.0 lb

## 2022-01-20 DIAGNOSIS — I480 Paroxysmal atrial fibrillation: Secondary | ICD-10-CM

## 2022-01-20 DIAGNOSIS — I10 Essential (primary) hypertension: Secondary | ICD-10-CM

## 2022-01-20 DIAGNOSIS — Z79899 Other long term (current) drug therapy: Secondary | ICD-10-CM

## 2022-01-20 DIAGNOSIS — I2510 Atherosclerotic heart disease of native coronary artery without angina pectoris: Secondary | ICD-10-CM

## 2022-01-20 DIAGNOSIS — Z950 Presence of cardiac pacemaker: Secondary | ICD-10-CM | POA: Diagnosis not present

## 2022-01-20 DIAGNOSIS — I5032 Chronic diastolic (congestive) heart failure: Secondary | ICD-10-CM | POA: Diagnosis not present

## 2022-01-20 LAB — CUP PACEART INCLINIC DEVICE CHECK
Battery Remaining Longevity: 125 mo
Battery Voltage: 3.02 V
Brady Statistic AP VP Percent: 26.94 %
Brady Statistic AP VS Percent: 11.65 %
Brady Statistic AS VP Percent: 46.98 %
Brady Statistic AS VS Percent: 14.39 %
Brady Statistic RA Percent Paced: 34.76 %
Brady Statistic RV Percent Paced: 75.49 %
Date Time Interrogation Session: 20230127184634
Implantable Lead Implant Date: 20210702
Implantable Lead Implant Date: 20210702
Implantable Lead Location: 753859
Implantable Lead Location: 753860
Implantable Lead Model: 5076
Implantable Lead Model: 5076
Implantable Pulse Generator Implant Date: 20210702
Lead Channel Impedance Value: 266 Ohm
Lead Channel Impedance Value: 342 Ohm
Lead Channel Impedance Value: 437 Ohm
Lead Channel Impedance Value: 513 Ohm
Lead Channel Pacing Threshold Amplitude: 0.5 V
Lead Channel Pacing Threshold Amplitude: 0.5 V
Lead Channel Pacing Threshold Pulse Width: 0.4 ms
Lead Channel Pacing Threshold Pulse Width: 0.4 ms
Lead Channel Sensing Intrinsic Amplitude: 3.875 mV
Lead Channel Sensing Intrinsic Amplitude: 4 mV
Lead Channel Sensing Intrinsic Amplitude: 9.5 mV
Lead Channel Sensing Intrinsic Amplitude: 9.5 mV
Lead Channel Setting Pacing Amplitude: 1.5 V
Lead Channel Setting Pacing Amplitude: 2.5 V
Lead Channel Setting Pacing Pulse Width: 0.4 ms
Lead Channel Setting Sensing Sensitivity: 1.2 mV

## 2022-01-20 MED ORDER — METOPROLOL SUCCINATE ER 50 MG PO TB24: 50.0000 mg | ORAL_TABLET | Freq: Two times a day (BID) | ORAL | 3 refills | Status: DC

## 2022-01-20 MED ORDER — AMLODIPINE BESYLATE 5 MG PO TABS: 2.5000 mg | ORAL_TABLET | Freq: Every day | ORAL | 3 refills | Status: DC

## 2022-01-20 MED ORDER — FUROSEMIDE 40 MG PO TABS: 40.0000 mg | ORAL_TABLET | Freq: Two times a day (BID) | ORAL | 3 refills | Status: DC

## 2022-01-20 NOTE — Progress Notes (Signed)
Cardiology Office Note Date:  01/20/2022  Patient ID:  [name], DOB 24-Mar-1951, MRN [id number] PCP:  Eulis Foster, MD  Cardiologist:  Dr. Marlou Porch EP: Dr. Curt Bears    Chief Complaint: planned f/u  History of Present Illness: [name] is a 71 y.o. female with history of HTN, DM (type I), HLD, thyroid cancer s/p thyroidectomy, stroke (2016 and 2019), GERD, normal coronaries by cath in 2013, HFpEF, significant atrial ectopy, and via loop found to have AFib. >> symptomatic bradycardia >> PPM   She comes in today to be seen for Dr. Curt Bears, last seen by him July 2022, doing well, no changes were made  She saw [name], PA-C 11/14/21, she was generally doing well from a cardiac perspective, she had a fall recently with some fractured ribs, mobilizing slowly, discussed some personal concerns/burdening family, though all in all doing ok Using her BIPAP at night, no changes were made.  I saw her 11/23/21 No changes since she saw Johann Capers the other day No new symptoms or concerns No near syncope or syncope No bleeding or signs of bleeding During the visit she was in/out of an AFlutter/Atach (v paced) rhythm without awareness Her amlodipine reduced and toprol started Planned to see her back and revisit rhythm/burden and perhaps transition to BB alone  Following with neurology w/hx of strokes, working on core strength to improve gait stability  TODAY She is doing "fine!" She has no CP or cardiac awareness, says if she is having Afib she doesn't know it. She reports essentially nonambulatory, often seated ,elevated her feet some She is edematous, mentions she is NOT taking Torsemide as the list says, that 2 weeks ago she was changed to furosemide 20mg  BID, she says by her endocrinologist. She denies SOB but the LE swelling is not improved. She reports that she tends to have some swelling, but this more then usual No dizzy spells, no near syncope or  syncope.   Device information MDT ILR, implanted 12/23/2018 (remains implanted) MDT dual chamber PPM implanted 06/25/20   Past Medical History:  Diagnosis Date   Abnormal echocardiogram    Anemia    Arthritis    BPPV (benign paroxysmal positional vertigo), right 06/13/2017   Bradycardia 03/07/2020   Cerebrovascular accident (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 ([place]) 10/29/2015   CHB (complete heart block) ([place]) 03/08/2020   CHF (congestive heart failure) ([place])    Closed fracture of left proximal humerus 07/08/2019   Decreased range of motion of left shoulder 09/03/2020   Depression    Dizzy 03/07/2020   bradycardia   Edentulous 09/03/2020   GERD (gastroesophageal reflux disease)    Hemiparesis affecting left side as late effect of cerebrovascular accident (CVA) ([place]) 03/08/2020   Hemorrhoids    History of Falls with injury 03/08/2020   Fall resulting in left humeral fracture   Hyperlipidemia    Hypertension    Hypothyroidism    Intracranial vascular stenosis    Lacunar infarction ([place])    Brain MRI 07/2021 multiple small remote pontine lacunar infarcts   Near syncope 03/08/2020   Paroxysmal atrial fibrillation (HCC)    Presence of permanent cardiac pacemaker    Proteinuria 03/08/2020   Seasonal allergies    Stroke ([place])    Thyroid cancer ([place])    per pt S/p Total Thyroidectomy with Radioactive Iodine Therapy   Type 1 diabetes mellitus ([place])     Past Surgical History:  Procedure Laterality Date   ABDOMINAL ANGIOGRAM  N/A 05/14/2012   Procedure: ABDOMINAL ANGIOGRAM;  Surgeon: Laverda Page, MD;  Location: [facility] CATH LAB;  Service: Cardiovascular;  Laterality: N/A;   ABDOMINAL HYSTERECTOMY  1995   partial   ANKLE FRACTURE SURGERY Right    CARDIAC CATHETERIZATION     with coronary angiogram   cataract  Bilateral 2018   COLONOSCOPY N/A 09/09/2014   Procedure: COLONOSCOPY;  Surgeon: Gatha Mayer, MD;  Location: [facility];  Service: Endoscopy;  Laterality:  N/A;   ESOPHAGOGASTRODUODENOSCOPY (EGD) WITH PROPOFOL N/A 12/03/2018   Procedure: ESOPHAGOGASTRODUODENOSCOPY (EGD) WITH PROPOFOL;  Surgeon: Doran Stabler, MD;  Location: [facility];  Service: Gastroenterology;  Laterality: N/A;   LEFT AND RIGHT HEART CATHETERIZATION WITH CORONARY ANGIOGRAM N/A 05/14/2012   Procedure: LEFT AND RIGHT HEART CATHETERIZATION WITH CORONARY ANGIOGRAM;  Surgeon: Laverda Page, MD;  Location: [facility] CATH LAB;  Service: Cardiovascular;  Laterality: N/A;   LOOP RECORDER INSERTION N/A 12/23/2018   Procedure: LOOP RECORDER INSERTION;  Surgeon: Constance Haw, MD;  Location: [facility] CV LAB;  Service: Cardiovascular;  Laterality: N/A;   LOOP RECORDER REMOVAL N/A 06/25/2020   Procedure: LOOP RECORDER REMOVAL;  Surgeon: Constance Haw, MD;  Location: [facility] CV LAB;  Service: Cardiovascular;  Laterality: N/A;   MINI METER   09/08/2014   ELECTRONIC INSULIN PUMP   PACEMAKER IMPLANT N/A 06/25/2020   Procedure: PACEMAKER IMPLANT;  Surgeon: Constance Haw, MD;  Location: [facility] CV LAB;  Service: Cardiovascular;  Laterality: N/A;   TEE WITHOUT CARDIOVERSION N/A 10/16/2018   Procedure: TRANSESOPHAGEAL ECHOCARDIOGRAM (TEE);  Surgeon: Buford Dresser, MD;  Location[facility] ENDOSCOPY;  Service: Cardiovascular;  Laterality: N/A;   THYROIDECTOMY  2006   for thyroid cancer    Current Outpatient Medications  Medication Sig Dispense Refill   acetaminophen (TYLENOL) 325 MG tablet Take 2 tablets (650 mg total) by mouth every 6 (six) hours as needed for moderate pain.     amLODipine (NORVASC) 5 MG tablet Take 1 tablet (5 mg total) by mouth daily. 90 tablet 3   apixaban (ELIQUIS) 5 MG TABS tablet Take 1 tablet (5 mg total) by mouth 2 (two) times daily. 180 tablet 0   aspirin EC 81 MG tablet Aspirin 81 mg     atorvastatin (LIPITOR) 80 MG tablet Take 1 tablet (80 mg total) by mouth every evening. 90 tablet 1   busPIRone (BUSPAR) 5 MG tablet Take 1 tablet  (5 mg total) by mouth 3 (three) times daily. 90 tablet 2   fexofenadine (ALLEGRA) 180 MG tablet Take 1 tablet (180 mg total) by mouth daily as needed for allergies or rhinitis. 30 tablet 0   FLUoxetine (PROZAC) 10 MG capsule Take 1 capsule (10 mg total) by mouth daily. 90 capsule 1   FLUoxetine (PROZAC) 20 MG capsule Take 1 capsule (20 mg total) by mouth daily. 90 capsule 1   FUROSEMIDE PO Furosemide     gabapentin (NEURONTIN) 300 MG capsule Take 1 capsule (300 mg total) by mouth 3 (three) times daily. 90 capsule 1   insulin aspart (NOVOLOG) 100 UNIT/ML injection Inject 5 Units into the skin 3 (three) times daily before meals. For blood sugar greater than or equal to 200 10 mL 11   Insulin Human (INSULIN PUMP) SOLN Inject 1 each into the skin 3 times daily with meals, bedtime and 2 AM.     ISOSORBIDE PO Isosorbide     levothyroxine (SYNTHROID) 175 MCG tablet Take 1 tablet (175 mcg total) by mouth  daily before breakfast. 30 tablet 0   LOVASTATIN PO Lovastatin     metoprolol succinate (TOPROL-XL) 50 MG 24 hr tablet Take 1 tablet (50 mg total) by mouth daily. Take with or immediately following a meal. 90 tablet 3   Multiple Vitamins-Minerals (OCUVITE PO) Take 1 tablet by mouth daily.     nystatin (MYCOSTATIN) 100000 UNIT/ML suspension Take 5 mLs (500,000 Units total) by mouth 4 (four) times daily. 60 mL 0   OMEPRAZOLE PO Omeprazole     pantoprazole (PROTONIX) 40 MG tablet Take 1 tablet (40 mg total) by mouth 2 (two) times daily. 60 tablet 0   polyethylene glycol powder (GLYCOLAX/MIRALAX) 17 GM/SCOOP powder Take 17 g by mouth daily. 507 g 0   potassium chloride (KLOR-CON) 10 MEQ tablet Take 1 tablet (10 mEq total) by mouth daily. 30 tablet 0   torsemide (DEMADEX) 10 MG tablet Take 1 tablet (10 mg total) by mouth daily. 30 tablet 0   No current facility-administered medications for this visit.    Allergies:   Latex   Social History:  The patient  reports that she has never smoked. She has never  used smokeless tobacco. She reports that she does not drink alcohol and does not use drugs.   Family History:  The patient's family history includes Diabetes in her maternal aunt; Early death in her father; Heart attack in her maternal grandfather and mother; Heart disease in her maternal aunt, maternal grandfather, and mother; Heart failure in her maternal grandfather; Hyperlipidemia in her father and mother; Hypertension in her father and mother; Prostate cancer in her paternal uncle; Renal Disease in her mother; Stroke in her son.  ROS:  Please see the history of present illness.   All other systems are reviewed and otherwise negative.   PHYSICAL EXAM:  VS:  There were no vitals taken for this visit. BMI: There is no height or weight on file to calculate BMI. Well nourished, well developed, in no acute distress  HEENT: normocephalic, atraumatic  Neck: no JVD, carotid bruits or masses Cardiac: irreg-irreg; no significant murmurs, no rubs, or gallops Lungs:  CTA b/l, no wheezing, rhonchi or rales  Abd: soft, nontender MS: no deformity or atrophy Ext: 1++ edema to below the knees, chronic looking skin changes b/l lE Skin: warm and dry, no rash Neuro:  No gross deficits appreciated Psych: euthymic mood, full affect  PPM and ILR sites are stable, no tethering or discomfort   EKG:  not done today  PPM interroation done today and reviewed by myself:  Battery and lead measurements are good + AF, burden 12.3% She is dependent today at 40, only VP 75.5%  08/05/2021: TTE IMPRESSIONS   1. Left ventricular ejection fraction, by estimation, is 60 to 65%. The  left ventricle has normal function. The left ventricle has no regional  wall motion abnormalities. There is mild left ventricular hypertrophy.  Left ventricular diastolic parameters  are indeterminate.   2. Right ventricular systolic function is normal. The right ventricular  size is normal. There is normal pulmonary artery systolic  pressure.   3. The mitral valve is normal in structure. No evidence of mitral valve  regurgitation. No evidence of mitral stenosis.   4. The aortic valve is tricuspid. Aortic valve regurgitation is not  visualized. Mild to moderate aortic valve sclerosis/calcification is  present, without any evidence of aortic stenosis.   5. The inferior vena cava is normal in size with greater than 50%  respiratory variability, suggesting right  atrial pressure of 3 mmHg.   Comparison(s): No significant change from prior study. Prior images  reviewed side by side.    CT CORONARY MORPH W/CTA COR W/SCORE W/CA W/CM &/OR WO/CM Addendum Date: 03/10/2020   IMPRESSION:  1. 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 CT FFR will be performed and reported separately. 2. The patient's coronary artery calcium score is 1371, which places the patient in the 99th percentile. Motion artifact from arrhythmia impacts the quantitation of coronary calcium which maybe overestimated. 3. Normal coronary origin with right dominance. 4. Poor contrast opacification of the tip of the left atrial appendage. Cannot exclude LA appendage thrombus vs incomplete contrast filling  Echocardiogram 03/08/2020 IMPRESSIONS   1. Left ventricular ejection fraction, by estimation, is 60 to 65%. Left  ventricular ejection fraction by PLAX is 63 %. The left ventricle has  normal function. The left ventricle has no regional wall motion  abnormalities. Left ventricular diastolic  parameters are indeterminate.   2. Right ventricular systolic function is normal. The right ventricular  size is normal. There is moderately elevated pulmonary artery systolic  pressure.   3. Left atrial size was mildly dilated.   4. The mitral valve is normal in structure. Mild mitral valve  regurgitation. No evidence of mitral stenosis.   5. Tricuspid valve regurgitation is mild to moderate.   6. The aortic  valve is tricuspid. Aortic valve regurgitation is not  visualized. No aortic stenosis is present.   7. The inferior vena cava is dilated in size with >50% respiratory  variability, suggesting right atrial pressure of 8 mmHg.      10/14/18: TTE Study Conclusions - Procedure narrative: Transthoracic echocardiography. Technically   difficult study. Intravenous contrast (Definity) was   administered. - Left ventricle: The cavity size was normal. There was moderate   concentric hypertrophy. Systolic function was normal. The   estimated ejection fraction was in the range of 60% to 65%. Wall   motion was normal; there were no regional wall motion   abnormalities. The study is not technically sufficient to allow   evaluation of LV diastolic function. - Left atrium: The atrium was normal in size. - Right atrium: The atrium was normal in size. Impressions: - Technically difficult study. Afib is noted. LVEF 60-65%, moderate   LVH, normal wall motion, normal biatrial size.   Recent Labs: 08/04/2021: Magnesium 1.8; TSH 1.610 10/11/2021: B Natriuretic Peptide 201.5 10/31/2021: ALT 19; Hemoglobin 12.7; Platelets 163 11/16/2021: BUN 17; Creatinine, Ser 1.41; Potassium 4.3; Sodium 142  08/04/2021: Cholesterol 102; HDL 44; LDL Cholesterol 50; Total CHOL/HDL Ratio 2.3; Triglycerides 39; VLDL 8   CrCl cannot be calculated (Patient's most recent lab result is older than the maximum 21 days allowed.).   Wt Readings from Last 3 Encounters:  11/23/21 250 lb (113.4 kg)  10/27/21 250 lb 12.8 oz (113.8 kg)  10/21/21 249 lb 9.6 oz (113.2 kg)     Other studies reviewed: Additional studies/records reviewed today include: summarized above  ASSESSMENT AND PLAN:  1. Paroxysmal AFib     CHA2DS2asc is 10, on Eliquis, appropriately dosed     Unaware of her AFib  Very frequent PACs  Afib burden 12%  Will reduce amlodipine and increase her toprol With no symptoms and rate control,  not sure we need  AAD  2. PPM Intact function No programming changes made  3. CAD     known branch vessel disease  Cards recs in the hospital were to start amlodipine if more CP, will keep it on, given no ongoing CP  4. HTN     Looks ok  5. HFpEF She is edematous today She has support stockings that she wears maybe once a week, urged to wear more often, elevated her feet, and minimize sodium Will increase her furosemide to 40mg  BID BMET today and in 2 weeks   Disposition: will try to get her on cardiology schedule in a couple weeks to follow up on her HF management, otherwise remotes as usual, EP can see her in a couple months for Afib burden/ managament      Current medicines are reviewed at length with the patient today.  The patient did not have any concerns regarding medicines  Signed, Tommye Standard, PA-C 01/20/2022 6:02 AM     [address]  [place] [address] [phone] (office)  [phone] (fax)

## 2022-01-20 NOTE — Patient Instructions (Addendum)
Medication Instructions:   START TAKING: FUROSEMIDE 40 MG TWICE A DAY   START TAKING: AMLODIPINE   2.5 MG ( HALF OF 5 MG ) ONCE A DAY   START TAKING: TOPROL XL 50 MG TWICE A DAY    *If you need a refill on your cardiac medications before your next appointment, please call your pharmacy*   Lab Work:  BMET [place] BMET IN 2 WEEKS   If you have labs (blood work) drawn today and your tests are completely normal, you will receive your results only by: [place] (if you have MyChart) OR A paper copy in the mail If you have any lab test that is abnormal or we need to change your treatment, we will call you to review the results.   Testing/Procedures: NONE ORDERED  TODAY    Follow-Up: At [facility], you and your health needs are our priority.  As part of our continuing mission to provide you with exceptional heart care, we have created designated Provider Care Teams.  These Care Teams include your primary Cardiologist (physician) and Advanced Practice Providers (APPs -  Physician Assistants and Nurse Practitioners) who all work together to provide you with the care you need, when you need it.  We recommend signing up for the patient portal called "MyChart".  Sign up information is provided on this After Visit Summary.  MyChart is used to connect with patients for Virtual Visits (Telemedicine).  Patients are able to view lab/test results, encounter notes, upcoming appointments, etc.  Non-urgent messages can be sent to your provider as well.   To learn more about what you can do with MyChart, go to [email or web address].    Your next appointment:   2 month(s)  The format for your next appointment:   In Person  Provider:  Tommye Standard PA-C    Other Instructions

## 2022-01-21 LAB — BASIC METABOLIC PANEL
BUN/Creatinine Ratio: 11 — ABNORMAL LOW (ref 12–28)
BUN: 12 mg/dL (ref 8–27)
CO2: 25 mmol/L (ref 20–29)
Calcium: 9 mg/dL (ref 8.7–10.3)
Chloride: 104 mmol/L (ref 96–106)
Creatinine, Ser: 1.06 mg/dL — ABNORMAL HIGH (ref 0.57–1.00)
Glucose: 252 mg/dL — ABNORMAL HIGH (ref 70–99)
Potassium: 4.1 mmol/L (ref 3.5–5.2)
Sodium: 143 mmol/L (ref 134–144)
eGFR: 56 mL/min/{1.73_m2} — ABNORMAL LOW (ref >59)

## 2022-02-02 ENCOUNTER — Other Ambulatory Visit: Payer: Self-pay | Admitting: Family Medicine

## 2022-02-02 ENCOUNTER — Encounter: Payer: Self-pay | Admitting: Family Medicine

## 2022-02-02 MED ORDER — FLUTICASONE PROPIONATE 50 MCG/ACT NA SUSP: 1.0000 | Freq: Every day | NASAL | 12 refills | Status: DC

## 2022-02-03 ENCOUNTER — Other Ambulatory Visit: Payer: Medicare PPO | Admitting: *Deleted

## 2022-02-03 ENCOUNTER — Other Ambulatory Visit: Payer: Self-pay

## 2022-02-03 DIAGNOSIS — Z79899 Other long term (current) drug therapy: Secondary | ICD-10-CM | POA: Diagnosis not present

## 2022-02-03 LAB — BASIC METABOLIC PANEL
BUN/Creatinine Ratio: 14 (ref 12–28)
BUN: 13 mg/dL (ref 8–27)
CO2: 24 mmol/L (ref 20–29)
Calcium: 9 mg/dL (ref 8.7–10.3)
Chloride: 105 mmol/L (ref 96–106)
Creatinine, Ser: 0.96 mg/dL (ref 0.57–1.00)
Glucose: 158 mg/dL — ABNORMAL HIGH (ref 70–99)
Potassium: 4 mmol/L (ref 3.5–5.2)
Sodium: 143 mmol/L (ref 134–144)
eGFR: 63 mL/min/{1.73_m2} (ref >59)

## 2022-02-10 ENCOUNTER — Encounter (HOSPITAL_COMMUNITY): Payer: Self-pay | Admitting: Student

## 2022-02-10 ENCOUNTER — Emergency Department (HOSPITAL_COMMUNITY): Payer: Medicare PPO

## 2022-02-10 ENCOUNTER — Other Ambulatory Visit: Payer: Self-pay

## 2022-02-10 ENCOUNTER — Inpatient Hospital Stay (HOSPITAL_COMMUNITY)
Admission: EM | Admit: 2022-02-10 | Discharge: 2022-02-15 | DRG: 189 | Disposition: A | Discharge status: Home Health | Payer: Medicare PPO | Attending: Family Medicine | Admitting: Family Medicine

## 2022-02-10 DIAGNOSIS — Z794 Long term (current) use of insulin: Secondary | ICD-10-CM

## 2022-02-10 DIAGNOSIS — I130 Hypertensive heart and chronic kidney disease with heart failure and stage 1 through stage 4 chronic kidney disease, or unspecified chronic kidney disease: Secondary | ICD-10-CM | POA: Diagnosis not present

## 2022-02-10 DIAGNOSIS — E890 Postprocedural hypothyroidism: Secondary | ICD-10-CM | POA: Diagnosis present

## 2022-02-10 DIAGNOSIS — G8929 Other chronic pain: Secondary | ICD-10-CM | POA: Diagnosis present

## 2022-02-10 DIAGNOSIS — Z9181 History of falling: Secondary | ICD-10-CM

## 2022-02-10 DIAGNOSIS — N1831 Chronic kidney disease, stage 3a: Secondary | ICD-10-CM | POA: Diagnosis present

## 2022-02-10 DIAGNOSIS — K219 Gastro-esophageal reflux disease without esophagitis: Secondary | ICD-10-CM | POA: Diagnosis present

## 2022-02-10 DIAGNOSIS — R0609 Other forms of dyspnea: Secondary | ICD-10-CM | POA: Diagnosis not present

## 2022-02-10 DIAGNOSIS — Z91199 Patient's noncompliance with other medical treatment and regimen due to unspecified reason: Secondary | ICD-10-CM

## 2022-02-10 DIAGNOSIS — E785 Hyperlipidemia, unspecified: Secondary | ICD-10-CM | POA: Diagnosis present

## 2022-02-10 DIAGNOSIS — I442 Atrioventricular block, complete: Secondary | ICD-10-CM | POA: Diagnosis present

## 2022-02-10 DIAGNOSIS — I5032 Chronic diastolic (congestive) heart failure: Secondary | ICD-10-CM | POA: Diagnosis present

## 2022-02-10 DIAGNOSIS — E1042 Type 1 diabetes mellitus with diabetic polyneuropathy: Secondary | ICD-10-CM | POA: Diagnosis present

## 2022-02-10 DIAGNOSIS — I272 Pulmonary hypertension, unspecified: Secondary | ICD-10-CM | POA: Diagnosis present

## 2022-02-10 DIAGNOSIS — I69354 Hemiplegia and hemiparesis following cerebral infarction affecting left non-dominant side: Secondary | ICD-10-CM

## 2022-02-10 DIAGNOSIS — R296 Repeated falls: Secondary | ICD-10-CM | POA: Diagnosis not present

## 2022-02-10 DIAGNOSIS — Z9641 Presence of insulin pump (external) (internal): Secondary | ICD-10-CM | POA: Diagnosis present

## 2022-02-10 DIAGNOSIS — E1022 Type 1 diabetes mellitus with diabetic chronic kidney disease: Secondary | ICD-10-CM | POA: Diagnosis present

## 2022-02-10 DIAGNOSIS — J302 Other seasonal allergic rhinitis: Secondary | ICD-10-CM | POA: Diagnosis present

## 2022-02-10 DIAGNOSIS — R0602 Shortness of breath: Principal | ICD-10-CM | POA: Diagnosis present

## 2022-02-10 DIAGNOSIS — R001 Bradycardia, unspecified: Secondary | ICD-10-CM | POA: Diagnosis present

## 2022-02-10 DIAGNOSIS — M545 Low back pain, unspecified: Secondary | ICD-10-CM | POA: Diagnosis present

## 2022-02-10 DIAGNOSIS — F32A Depression, unspecified: Secondary | ICD-10-CM | POA: Diagnosis present

## 2022-02-10 DIAGNOSIS — Z6836 Body mass index (BMI) 36.0-36.9, adult: Secondary | ICD-10-CM | POA: Diagnosis not present

## 2022-02-10 DIAGNOSIS — I480 Paroxysmal atrial fibrillation: Secondary | ICD-10-CM | POA: Diagnosis present

## 2022-02-10 DIAGNOSIS — I10 Essential (primary) hypertension: Secondary | ICD-10-CM | POA: Diagnosis not present

## 2022-02-10 DIAGNOSIS — Z20822 Contact with and (suspected) exposure to covid-19: Secondary | ICD-10-CM | POA: Diagnosis present

## 2022-02-10 DIAGNOSIS — Z950 Presence of cardiac pacemaker: Secondary | ICD-10-CM

## 2022-02-10 DIAGNOSIS — J9601 Acute respiratory failure with hypoxia: Principal | ICD-10-CM | POA: Diagnosis present

## 2022-02-10 DIAGNOSIS — R0902 Hypoxemia: Secondary | ICD-10-CM

## 2022-02-10 DIAGNOSIS — Z79899 Other long term (current) drug therapy: Secondary | ICD-10-CM

## 2022-02-10 DIAGNOSIS — E1059 Type 1 diabetes mellitus with other circulatory complications: Secondary | ICD-10-CM | POA: Diagnosis present

## 2022-02-10 DIAGNOSIS — Z8249 Family history of ischemic heart disease and other diseases of the circulatory system: Secondary | ICD-10-CM

## 2022-02-10 DIAGNOSIS — Z841 Family history of disorders of kidney and ureter: Secondary | ICD-10-CM

## 2022-02-10 DIAGNOSIS — Z833 Family history of diabetes mellitus: Secondary | ICD-10-CM

## 2022-02-10 DIAGNOSIS — Z79890 Hormone replacement therapy (postmenopausal): Secondary | ICD-10-CM

## 2022-02-10 DIAGNOSIS — J811 Chronic pulmonary edema: Secondary | ICD-10-CM | POA: Diagnosis not present

## 2022-02-10 DIAGNOSIS — Z85850 Personal history of malignant neoplasm of thyroid: Secondary | ICD-10-CM

## 2022-02-10 DIAGNOSIS — M7989 Other specified soft tissue disorders: Secondary | ICD-10-CM | POA: Diagnosis not present

## 2022-02-10 DIAGNOSIS — I517 Cardiomegaly: Secondary | ICD-10-CM | POA: Diagnosis not present

## 2022-02-10 DIAGNOSIS — I5030 Unspecified diastolic (congestive) heart failure: Secondary | ICD-10-CM | POA: Diagnosis present

## 2022-02-10 DIAGNOSIS — Z91040 Latex allergy status: Secondary | ICD-10-CM

## 2022-02-10 DIAGNOSIS — E876 Hypokalemia: Secondary | ICD-10-CM | POA: Diagnosis not present

## 2022-02-10 DIAGNOSIS — R609 Edema, unspecified: Secondary | ICD-10-CM | POA: Diagnosis not present

## 2022-02-10 DIAGNOSIS — G4733 Obstructive sleep apnea (adult) (pediatric): Secondary | ICD-10-CM | POA: Diagnosis present

## 2022-02-10 DIAGNOSIS — R300 Dysuria: Secondary | ICD-10-CM | POA: Diagnosis present

## 2022-02-10 DIAGNOSIS — Z9114 Patient's other noncompliance with medication regimen: Secondary | ICD-10-CM

## 2022-02-10 DIAGNOSIS — Z7982 Long term (current) use of aspirin: Secondary | ICD-10-CM

## 2022-02-10 DIAGNOSIS — Z7901 Long term (current) use of anticoagulants: Secondary | ICD-10-CM

## 2022-02-10 DIAGNOSIS — Z993 Dependence on wheelchair: Secondary | ICD-10-CM

## 2022-02-10 LAB — COMPREHENSIVE METABOLIC PANEL
ALT: 12 U/L (ref 0–44)
AST: 20 U/L (ref 15–41)
Albumin: 3 g/dL — ABNORMAL LOW (ref 3.5–5.0)
Alkaline Phosphatase: 76 U/L (ref 38–126)
Anion gap: 12 (ref 5–15)
BUN: 15 mg/dL (ref 8–23)
CO2: 24 mmol/L (ref 22–32)
Calcium: 8.8 mg/dL — ABNORMAL LOW (ref 8.9–10.3)
Chloride: 104 mmol/L (ref 98–111)
Creatinine, Ser: 1.1 mg/dL — ABNORMAL HIGH (ref 0.44–1.00)
GFR, Estimated: 54 mL/min — ABNORMAL LOW (ref >60)
Glucose, Bld: 95 mg/dL (ref 70–99)
Potassium: 3.8 mmol/L (ref 3.5–5.1)
Sodium: 140 mmol/L (ref 135–145)
Total Bilirubin: 1.8 mg/dL — ABNORMAL HIGH (ref 0.3–1.2)
Total Protein: 6.9 g/dL (ref 6.5–8.1)

## 2022-02-10 LAB — CBC WITH DIFFERENTIAL/PLATELET
Abs Immature Granulocytes: 0.01 10*3/uL (ref 0.00–0.07)
Basophils Absolute: 0 10*3/uL (ref 0.0–0.1)
Basophils Relative: 0 %
Eosinophils Absolute: 0.1 10*3/uL (ref 0.0–0.5)
Eosinophils Relative: 1 %
HCT: 34.4 % — ABNORMAL LOW (ref 36.0–46.0)
Hemoglobin: 10.6 g/dL — ABNORMAL LOW (ref 12.0–15.0)
Immature Granulocytes: 0 %
Lymphocytes Relative: 20 %
Lymphs Abs: 1 10*3/uL (ref 0.7–4.0)
MCH: 28.2 pg (ref 26.0–34.0)
MCHC: 30.8 g/dL (ref 30.0–36.0)
MCV: 91.5 fL (ref 80.0–100.0)
Monocytes Absolute: 0.6 10*3/uL (ref 0.1–1.0)
Monocytes Relative: 13 %
Neutro Abs: 3.3 10*3/uL (ref 1.7–7.7)
Neutrophils Relative %: 66 %
Platelets: 184 10*3/uL (ref 150–400)
RBC: 3.76 MIL/uL — ABNORMAL LOW (ref 3.87–5.11)
RDW: 15.1 % (ref 11.5–15.5)
WBC: 5 10*3/uL (ref 4.0–10.5)
nRBC: 0 % (ref 0.0–0.2)

## 2022-02-10 LAB — RESP PANEL BY RT-PCR (FLU A&B, COVID) ARPGX2
Influenza A by PCR: NEGATIVE
Influenza B by PCR: NEGATIVE
SARS Coronavirus 2 by RT PCR: NEGATIVE

## 2022-02-10 LAB — BRAIN NATRIURETIC PEPTIDE: B Natriuretic Peptide: 289.5 pg/mL — ABNORMAL HIGH (ref 0.0–100.0)

## 2022-02-10 LAB — CBG MONITORING, ED
Glucose-Capillary: 68 mg/dL — ABNORMAL LOW (ref 70–99)
Glucose-Capillary: 71 mg/dL (ref 70–99)
Glucose-Capillary: 103 mg/dL — ABNORMAL HIGH (ref 70–99)

## 2022-02-10 LAB — TROPONIN I (HIGH SENSITIVITY)
Troponin I (High Sensitivity): 17 ng/L (ref <18)
Troponin I (High Sensitivity): 20 ng/L — ABNORMAL HIGH (ref <18)

## 2022-02-10 MED ORDER — GABAPENTIN 300 MG PO CAPS
300.0000 mg | ORAL_CAPSULE | Freq: Three times a day (TID) | ORAL | Status: DC
  Administered 2022-02-10 – 2022-02-15 (×15): 300 mg via ORAL
  Filled 2022-02-10 (×16): qty 1

## 2022-02-10 MED ORDER — AMLODIPINE BESYLATE 2.5 MG PO TABS
2.5000 mg | ORAL_TABLET | Freq: Every day | ORAL | Status: DC
  Administered 2022-02-11 – 2022-02-15 (×5): 2.5 mg via ORAL
  Filled 2022-02-10 (×5): qty 1

## 2022-02-10 MED ORDER — FLUTICASONE PROPIONATE 50 MCG/ACT NA SUSP
1.0000 | Freq: Every day | NASAL | Status: DC
  Administered 2022-02-13 – 2022-02-15 (×3): 1 via NASAL
  Filled 2022-02-10 (×2): qty 16

## 2022-02-10 MED ORDER — ATORVASTATIN CALCIUM 80 MG PO TABS
80.0000 mg | ORAL_TABLET | Freq: Every evening | ORAL | Status: DC
  Administered 2022-02-10 – 2022-02-14 (×5): 80 mg via ORAL
  Filled 2022-02-10: qty 1
  Filled 2022-02-10: qty 2
  Filled 2022-02-10 (×3): qty 1

## 2022-02-10 MED ORDER — APIXABAN 5 MG PO TABS
5.0000 mg | ORAL_TABLET | Freq: Two times a day (BID) | ORAL | Status: DC
  Administered 2022-02-10 – 2022-02-15 (×10): 5 mg via ORAL
  Filled 2022-02-10 (×10): qty 1

## 2022-02-10 MED ORDER — INSULIN ASPART 100 UNIT/ML IJ SOLN: 0.0000 [IU] | INTRAMUSCULAR | Status: DC

## 2022-02-10 MED ORDER — FUROSEMIDE 10 MG/ML IJ SOLN
40.0000 mg | Freq: Once | INTRAMUSCULAR | Status: AC
  Administered 2022-02-10: 40 mg via INTRAVENOUS
  Filled 2022-02-10: qty 4

## 2022-02-10 MED ORDER — METOPROLOL SUCCINATE ER 50 MG PO TB24
50.0000 mg | ORAL_TABLET | Freq: Every day | ORAL | Status: DC
  Administered 2022-02-11 – 2022-02-15 (×5): 50 mg via ORAL
  Filled 2022-02-10 (×5): qty 1

## 2022-02-10 MED ORDER — BUSPIRONE HCL 5 MG PO TABS
5.0000 mg | ORAL_TABLET | Freq: Three times a day (TID) | ORAL | Status: DC
  Administered 2022-02-10 – 2022-02-15 (×14): 5 mg via ORAL
  Filled 2022-02-10 (×15): qty 1

## 2022-02-10 MED ORDER — PANTOPRAZOLE SODIUM 40 MG PO TBEC
40.0000 mg | DELAYED_RELEASE_TABLET | Freq: Two times a day (BID) | ORAL | Status: DC
  Administered 2022-02-10 – 2022-02-15 (×10): 40 mg via ORAL
  Filled 2022-02-10 (×11): qty 1

## 2022-02-10 MED ORDER — INSULIN PUMP
1.0000 | Freq: Three times a day (TID) | SUBCUTANEOUS | Status: DC
  Administered 2022-02-10 – 2022-02-14 (×13): 1 via SUBCUTANEOUS
  Filled 2022-02-10: qty 1

## 2022-02-10 MED ORDER — LEVOTHYROXINE SODIUM 75 MCG PO TABS
175.0000 ug | ORAL_TABLET | Freq: Every day | ORAL | Status: DC
  Administered 2022-02-11 – 2022-02-15 (×5): 175 ug via ORAL
  Filled 2022-02-10 (×5): qty 1

## 2022-02-10 NOTE — Progress Notes (Signed)
FPTS Brief Progress Note  S: Ms. Ackerley denies having any acute complaints at this time.  She notes that her leg swelling is much improved after her dose of IV Lasix earlier in the day.   O: BP (!) 122/57    Pulse (!) 56    Temp 99.1 F (37.3 C) (Oral)    Resp (!) 24    Ht 5\' 7"  (1.702 m)    Wt 104.3 kg    SpO2 96%    BMI 36.02 kg/m   General: Awake, alert, preparing to eat Pulm: Normal work of breathing on 3 L, lungs clear anteriorly Extremities: 1+ pedal edema bilaterally  A/P: Acute Hypoxic Respiratory Failure Suspect symptoms are secondary to her HFpEF in the setting of Lasix non-adherence. S/p IV Lasix 40mg  x1. Comfortable on 3L [place].  Lower extremity edema does seem much improved.  Diuresis per day team.   - Orders reviewed. Labs for AM ordered, which was adjusted as needed.   Eppie Gibson, MD 02/10/2022, 10:25 PM PGY-1, [facility] Night Resident  Please page [phone] with questions.

## 2022-02-10 NOTE — Progress Notes (Signed)
PT transported to 3E26 from ED. PT awake and alert, on 3LNC. Cpap set up and placed in room. Patient not ready to wear this time time. No resp distress noted.

## 2022-02-10 NOTE — H&P (Addendum)
[facility] Admission History and Physical Service Pager: [phone]  Patient name: [name] Medical record number: [id number] Date of birth: 12/09/1951 Age: 71 y.o. Gender: female  Primary Care Provider: Eulis Foster, MD Consultants: none Code Status: Full   Preferred Emergency Contact:  Contact Information     Name Relation Home Work [place] Sister [phone]  [phone]   Lee,Candance Niece   [phone]       Chief Complaint: Shortness of breath, BLE edema   Assessment and Plan: [name] is a 71 y.o. female presenting with hypoxic respiratory failure and BLE edema. PMH is significant for GERD, HFpEF, OSA, DM1, Hypothyroidism, HTN, HLD, pAF, h/o CVA (2016 and 2019), thyroid cancer s/p thyroidectomy.   Acute Hypoxic Respiratory Failure   HFpEF (EF 60-65%) On 2.5L, does not require any at baseline, was satting 79% in the ED. Respiratory distress likely to be secondary to volume overload given lack of adherence to Lasix over the last few days. Significant labs in the ED include: BNP 289.5, Cr 1.1 (around baseline), GFR 54, T bili 1.8, albumin 3 with calcium 8.8 (corrected wnl), RPP negative, CXR shows chronic changes with no acute findings, EKG showed a paced rhythm. No signs of PNA on exam or clinically, no history of COPD or asthma. Troponin's trended flat with no signs of ACS, wells score 0, PE has low likelihood, not likely anemia with hgb of 10.6. Echo from 08/05/21: Left ventricular ejection fraction, by estimation, is 60 to 65%. The left ventricle has normal function. The left ventricle has no regional wall motion abnormalities. There is mild left ventricular hypertrophy. Left ventricular diastolic parameters are indeterminate. In the ED, she received IV Lasix 40 mg.  -Admitted to med-surg, Attending Dr. Erin Hearing  -VS per protocol  -Echo pending  -Redose Lasix in AM -Monitor kidney  function  -oxygen supplementation as needed  -AM EKG, CBC, CMP, TSH -HA1C, UA -Daily weights -Strict I/Os  -Up with assistance  -Pt/OT eval and treat  -Continuous pulse ox   Dysuria Endorses intermittent pain with urination over the past month. Denies any associated odor or discharge. Rule out UTI with UA. May need wet prep to evaluate for candidal infection.  -UA pending  -HA1C  DM1 Pt uses insulin pump and has scheduled Novolog 5U 3xdaily. HA1C 8.5 on 11/22.  -Repeat A1C ordered  -mSSI  -CBG q4hours   HTN Blood pressure range 144/61, 129/74. Home medications: Amlodipine 2.5 mg daily, Metoprolol 50 mg daily.  -Continue home medications  -monitor BP  GERD  Patient takes protonix 40 mg 2xdaily  -Continue home medications   Thyroidectomy  Home medication Levothyroxine 175 mcg  -Continue home medication -TSH  OSA BiPAP ON   H/o CVA (2016 and 2019)   Chronic bradycardia   Pacemaker placement   pAF Continue home medication of Metoprolol (If HR can tolerate, currently at 50s), Eliquis 5mg  BID.   HLD  -Continue home Lipitor 80mg  daily      FEN/GI: DYS3>await bedside swallow  Prophylaxis: Eliquis   Disposition: Med tele   History of Present Illness:  [name] is a 71 y.o. female presenting with shortness of breath for the past day. She has been coughing up some phlegm and started to have shortness of breath yesterday. She was using a nasal spray, flonase, that helped. She says that she could not find her Flonase this AM, so took Singulair. Her sister left to work and she  thinks she may have put it somewhere else so she was not able to take the Flonase. She has not been able to sleep well recently. She does not use oxygen at home. She does not have COPD or asthma but has a history of heart failure. She takes lasix 20 twice daily usually but has not been taking it over the past few days. She has noticed swelling in her legs which occurs every now and then.  This time, she is not sure how long the swelling is there as her brother told her this morning. She sees a cardiologist routinely. Denies tobacco use. Endorses prior occasional alcohol use but since 1990, she has not been drinking at all since she was diagnosed with diabetes. Denies substance use. She uses a wheelchair to get around at home. Denies nausea, vomiting, abdominal pain and diarrhea. Endorses occasional dysuria over the past month. Denies weakness, headaches and vision changes.   Review Of Systems: Per HPI with the following additions:   Review of Systems  Constitutional:  Negative for activity change, fatigue and fever.  HENT:  Negative for congestion and sore throat.   Respiratory:  Positive for cough and shortness of breath. Negative for chest tightness and wheezing.   Cardiovascular:  Positive for leg swelling. Negative for chest pain and palpitations.  Gastrointestinal:  Negative for abdominal distention, abdominal pain, blood in stool, diarrhea, nausea and vomiting.  Genitourinary:  Positive for dysuria. Negative for difficulty urinating and frequency.  Neurological:  Negative for dizziness, syncope and light-headedness.  Psychiatric/Behavioral:  Negative for behavioral problems and confusion.     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   Chronic diastolic congestive heart failure ([place])    Dyspnea    Weakness    Left pontine stroke ([place]) 08/06/2021   BMI 40.0-44.9, adult ([place]) 08/06/2021   Weakness of left side of body 08/04/2021   Wound of left foot 07/26/2021   Unable to perform bed mobility without assistance 07/26/2021   Pressure ulcer 06/29/2021   Stiffness due to  immobility 05/18/2021   Healthcare maintenance 02/17/2021   Mild cognitive impairment 09/03/2020   Presbyopia 09/03/2020   Counseling regarding advanced directives 09/03/2020   Constipation 09/03/2020   Imbalance 09/03/2020   Decreased range of motion of left shoulder 09/03/2020   Chronic lower back pain 07/27/2020   S/P placement of cardiac pacemaker 06/25/20  06/26/2020   Chronic DOAC therapy 03/08/2020   Hemiparesis and numbness affecting left side as late effect of cerebrovascular accident (CVA) ([place]) 03/08/2020   History of Falls with injury 03/08/2020   Hypoalbuminemia 03/08/2020   Secondary hypercoagulable state ([place]) 11/12/2019   Dependent for wheelchair mobility 08/05/2019   OSA (obstructive sleep apnea) 05/27/2019   Paroxysmal atrial fibrillation ([place]) 05/27/2019   Insulin pump in place    (HFpEF) heart failure with preserved ejection fraction ([place]) 03/17/2019   Mood disorder ([place]) 10/29/2018   Hypertension associated with diabetes ([place]) 10/29/2015   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 ([place])    Lower extremity edema 08/20/2013   Morbid obesity ([place]) 05/29/2013   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 ([place]) 71/05/2693   Hypothyroidism, 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  GERD (gastroesophageal reflux disease) 01/25/2012    Past Medical History: Past Medical History:  Diagnosis Date   Abnormal echocardiogram    Anemia    Arthritis    BPPV (benign paroxysmal positional vertigo), right 06/13/2017   Bradycardia 03/07/2020   Cerebrovascular accident (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 ([place]) 10/29/2015   CHB (complete heart block) ([place]) 03/08/2020   CHF (congestive heart failure) ([place])    Closed fracture of left proximal humerus 07/08/2019   Decreased range of motion of left shoulder 09/03/2020   Depression    Dizzy 03/07/2020   bradycardia   Edentulous 09/03/2020   GERD (gastroesophageal reflux disease)    Hemiparesis  affecting left side as late effect of cerebrovascular accident (CVA) ([place]) 03/08/2020   Hemorrhoids    History of Falls with injury 03/08/2020   Fall resulting in left humeral fracture   Hyperlipidemia    Hypertension    Hypothyroidism    Intracranial vascular stenosis    Lacunar infarction ([place])    Brain MRI 07/2021 multiple small remote pontine lacunar infarcts   Near syncope 03/08/2020   Paroxysmal atrial fibrillation ([facility])    Presence of permanent cardiac pacemaker    Proteinuria 03/08/2020   Seasonal allergies    Stroke ([place])    Thyroid cancer ([place])    per pt S/p Total Thyroidectomy with Radioactive Iodine Therapy   Type 1 diabetes mellitus ([place])     Past Surgical History: Past Surgical History:  Procedure Laterality Date   ABDOMINAL ANGIOGRAM N/A 05/14/2012   Procedure: ABDOMINAL ANGIOGRAM;  Surgeon: Laverda Page, MD;  Location: [facility] CATH LAB;  Service: Cardiovascular;  Laterality: N/A;   ABDOMINAL HYSTERECTOMY  1995   partial   ANKLE FRACTURE SURGERY Right    CARDIAC CATHETERIZATION     with coronary angiogram   cataract  Bilateral 2018   COLONOSCOPY N/A 09/09/2014   Procedure: COLONOSCOPY;  Surgeon: Gatha Mayer, MD;  Location: [place];  Service: Endoscopy;  Laterality: N/A;   ESOPHAGOGASTRODUODENOSCOPY (EGD) WITH PROPOFOL N/A 12/03/2018   Procedure: ESOPHAGOGASTRODUODENOSCOPY (EGD) WITH PROPOFOL;  Surgeon: Doran Stabler, MD;  Location: [place];  Service: Gastroenterology;  Laterality: N/A;   LEFT AND RIGHT HEART CATHETERIZATION WITH CORONARY ANGIOGRAM N/A 05/14/2012   Procedure: LEFT AND RIGHT HEART CATHETERIZATION WITH CORONARY ANGIOGRAM;  Surgeon: Laverda Page, MD;  Location: [facility] CATH LAB;  Service: Cardiovascular;  Laterality: N/A;   LOOP RECORDER INSERTION N/A 12/23/2018   Procedure: LOOP RECORDER INSERTION;  Surgeon: Constance Haw, MD;  Location: [place] CV LAB;  Service: Cardiovascular;  Laterality: N/A;   LOOP RECORDER REMOVAL  N/A 06/25/2020   Procedure: LOOP RECORDER REMOVAL;  Surgeon: Constance Haw, MD;  Location: [place] CV LAB;  Service: Cardiovascular;  Laterality: N/A;   MINI METER   09/08/2014   ELECTRONIC INSULIN PUMP   PACEMAKER IMPLANT N/A 06/25/2020   Procedure: PACEMAKER IMPLANT;  Surgeon: Constance Haw, MD;  Location: [place] CV LAB;  Service: Cardiovascular;  Laterality: N/A;   TEE WITHOUT CARDIOVERSION N/A 10/16/2018   Procedure: TRANSESOPHAGEAL ECHOCARDIOGRAM (TEE);  Surgeon: Buford Dresser, MD;  Location[facility] ENDOSCOPY;  Service: Cardiovascular;  Laterality: N/A;   THYROIDECTOMY  2006   for thyroid cancer    Social History: Social History   Tobacco Use   Smoking status: Never   Smokeless tobacco: Never  Vaping Use   Vaping Use: Never used  Substance Use Topics   Alcohol use: No   Drug use: No  Please also refer to relevant sections of EMR.  Family History: Family History  Problem Relation Age of Onset   Heart disease Mother    Hyperlipidemia Mother    Hypertension Mother    Renal Disease Mother        insufficiency   Heart attack Mother        in his 65's   Early death Father        Head Injury at Work   Hyperlipidemia Father    Hypertension Father    Stroke Son    Diabetes Maternal Aunt    Prostate cancer Paternal Uncle    Heart disease Maternal Aunt    Heart failure Maternal Grandfather    Heart disease Maternal Grandfather    Heart attack Maternal Grandfather        Died in his 54's   Breast cancer Neg Hx     Allergies and Medications: Allergies  Allergen Reactions   Latex Rash   No current facility-administered medications on file prior to encounter.   Current Outpatient Medications on File Prior to Encounter  Medication Sig Dispense Refill   fluticasone (FLONASE) 50 MCG/ACT nasal spray Place 1 spray into both nostrils daily. 16 g 12   acetaminophen (TYLENOL) 325 MG tablet Take 2 tablets (650 mg total) by mouth every 6 (six)  hours as needed for moderate pain.     amLODipine (NORVASC) 5 MG tablet Take 0.5 tablets (2.5 mg total) by mouth daily. 45 tablet 3   apixaban (ELIQUIS) 5 MG TABS tablet Take 1 tablet (5 mg total) by mouth 2 (two) times daily. 180 tablet 0   aspirin EC 81 MG tablet Aspirin 81 mg (Patient not taking: Reported on 01/20/2022)     atorvastatin (LIPITOR) 80 MG tablet Take 1 tablet (80 mg total) by mouth every evening. 90 tablet 1   busPIRone (BUSPAR) 5 MG tablet Take 1 tablet (5 mg total) by mouth 3 (three) times daily. 90 tablet 2   fexofenadine (ALLEGRA) 180 MG tablet Take 1 tablet (180 mg total) by mouth daily as needed for allergies or rhinitis. (Patient not taking: Reported on 01/20/2022) 30 tablet 0   FLUoxetine (PROZAC) 10 MG capsule Take 1 capsule (10 mg total) by mouth daily. 90 capsule 1   FLUoxetine (PROZAC) 20 MG capsule Take 1 capsule (20 mg total) by mouth daily. 90 capsule 1   furosemide (LASIX) 40 MG tablet Take 1 tablet (40 mg total) by mouth 2 (two) times daily. 180 tablet 3   gabapentin (NEURONTIN) 300 MG capsule Take 1 capsule (300 mg total) by mouth 3 (three) times daily. 90 capsule 1   insulin aspart (NOVOLOG) 100 UNIT/ML injection Inject 5 Units into the skin 3 (three) times daily before meals. For blood sugar greater than or equal to 200 10 mL 11   Insulin Human (INSULIN PUMP) SOLN Inject 1 each into the skin 3 times daily with meals, bedtime and 2 AM.     ISOSORBIDE PO Isosorbide     levothyroxine (SYNTHROID) 175 MCG tablet Take 1 tablet (175 mcg total) by mouth daily before breakfast. 30 tablet 0   LOVASTATIN PO Lovastatin     metoprolol succinate (TOPROL-XL) 50 MG 24 hr tablet Take 1 tablet (50 mg total) by mouth in the morning and at bedtime. Take with or immediately following a meal. 180 tablet 3   Multiple Vitamins-Minerals (OCUVITE PO) Take 1 tablet by mouth daily. (Patient not taking: Reported on 01/20/2022)     nystatin (  MYCOSTATIN) 100000 UNIT/ML suspension Take 5 mLs  (500,000 Units total) by mouth 4 (four) times daily. (Patient not taking: Reported on 01/20/2022) 60 mL 0   OMEPRAZOLE PO Omeprazole (Patient not taking: Reported on 01/20/2022)     pantoprazole (PROTONIX) 40 MG tablet Take 1 tablet (40 mg total) by mouth 2 (two) times daily. 60 tablet 0   polyethylene glycol powder (GLYCOLAX/MIRALAX) 17 GM/SCOOP powder Take 17 g by mouth daily. (Patient taking differently: Take 17 g by mouth as needed.) 507 g 0   potassium chloride (KLOR-CON) 10 MEQ tablet Take 1 tablet (10 mEq total) by mouth daily. 30 tablet 0    Objective: BP (!) 144/61    Pulse (!) 54    Temp 98.7 F (37.1 C) (Oral)    Resp (!) 22    Ht 5\' 7"  (1.702 m)    Wt 104.3 kg    SpO2 98%    BMI 36.02 kg/m  Exam: General: NAD, sitting up in bed, pleasant  Eyes: Tracking well, PERRLA ENTM: No abnormalities  Neck: FORM, no LAD Cardiovascular: Paced rhythm, bradycardia no murmurs Respiratory: CTAB without crackles or focal diminishment  Gastrointestinal: Nondistended, obese,soft, normoactive bs MSK: FROM, 3+ pitting edema BLE   Derm: Chronic lower extremity skin changes  Neuro: No obvious neurological deficits, tracks and responds appropriately, A&Ox4 Psych: Appropriate mood and behavior   Labs and Imaging: CBC BMET  Recent Labs  Lab 02/10/22 1212  WBC 5.0  HGB 10.6*  HCT 34.4*  PLT 184   Recent Labs  Lab 02/10/22 1212  NA 140  K 3.8  CL 104  CO2 24  BUN 15  CREATININE 1.10*  GLUCOSE 95  CALCIUM 8.8*     EKG: My own interpretation: paced rhythm    DG Chest Portable 1 View  Result Date: 02/10/2022 CLINICAL DATA:  Shortness of breath, leg swelling EXAM: PORTABLE CHEST 1 VIEW COMPARISON:  Portable exam 1318 hours compared to 10/31/2021 FINDINGS: LEFT subclavian sequential transvenous pacemaker leads project at RIGHT atrium and RIGHT ventricle. Enlargement of cardiac silhouette with pulmonary vascular congestion. Mediastinal contours normal. Atherosclerotic calcification aorta.  Chronic accentuation of LEFT perihilar and basilar markings. No acute infiltrate, pleural effusion, or pneumothorax. Osseous demineralization with displaced fracture of the lateral LEFT sixth rib. The list pacemaker projects over cardiac apex. IMPRESSION: Chronic changes. No acute abnormalities. Electronically Signed   By: Lavonia Dana M.D.   On: 02/10/2022 13:31     Erskine Emery, MD 02/10/2022, 6:03 PM PGY-1, [place] Intern pager: [phone], text pages welcome  I was personally present and performed or re-performed the history, physical exam and medical decision making activities of this service and have verified that the service and findings are accurately documented in the intern's note.  Donney Dice, DO                  02/10/2022, 7:45 PM  PGY-2, [place]

## 2022-02-10 NOTE — ED Triage Notes (Signed)
Pt arrived via [facility] from home. Per EMS, home healthcare called for pt to be transported to the ED d/t pt having worsening edema in legs bilateral which has progressed over the past 6 months. Pt states she had recent change from lasix to torsemide and has been compliant. Pt also c/o increased SOB especially while lying flat over the past couple weeks with a productive cough. Pt caox4 on arrival to ED. Per EMS pt had SpO2 89-91% on RA so they placed her on O2. Pt desatted when transferring to ED bed but came back to WNL being back on O2 via [place] at 2L.  EMS VS  BP 158/80 HR 80  RR 20 Spo2 89% RA, 99% 2L O2 CBG 147

## 2022-02-10 NOTE — ED Notes (Signed)
ED TO INPATIENT HANDOFF REPORT  ED Nurse Name and Phone #: Izreal Kock RN [phone]  S Name/Age/Gender [name] 71 y.o. female Room/Bed: 042C/042C  Code Status   Code Status: Full Code  Home/SNF/Other Home Patient oriented to: self, place, time, and situation Is this baseline? Yes   Triage Complete: Triage complete  Chief Complaint SOB (shortness of breath) [R06.02]  Triage Note Pt arrived via [facility] from home. Per EMS, home healthcare called for pt to be transported to the ED d/t pt having worsening edema in legs bilateral which has progressed over the past 6 months. Pt states she had recent change from lasix to torsemide and has been compliant. Pt also c/o increased SOB especially while lying flat over the past couple weeks with a productive cough. Pt caox4 on arrival to ED. Per EMS pt had SpO2 89-91% on RA so they placed her on O2. Pt desatted when transferring to ED bed but came back to WNL being back on O2 via [place] at 2L.  EMS VS  BP 158/80 HR 80  RR 20 Spo2 89% RA, 99% 2L O2 CBG 147   Allergies Allergies  Allergen Reactions   Latex Rash    Level of Care/Admitting Diagnosis ED Disposition     ED Disposition  Admit   Condition  --   Comment  Hospital Area: [place] [[id number]]  Level of Care: Telemetry Medical [[id number]]  May admit patient to [facility] or [facility] if equivalent level of care is available:: Yes  Covid Evaluation: Asymptomatic Screening Protocol (No Symptoms)  Diagnosis: SOB (shortness of breath) [009381]  Admitting Physician: Erskine Emery [[id number]]  Attending Physician: Lind Covert [[id number]]  Estimated length of stay: past midnight tomorrow  Certification:: I certify this patient will need inpatient services for at least 2 midnights          B Medical/Surgery History Past Medical History:  Diagnosis Date   Abnormal echocardiogram    Anemia    Arthritis    BPPV (benign paroxysmal positional  vertigo), right 06/13/2017   Bradycardia 03/07/2020   Cerebrovascular accident (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 ([place]) 10/29/2015   CHB (complete heart block) ([place]) 03/08/2020   CHF (congestive heart failure) ([place])    Closed fracture of left proximal humerus 07/08/2019   Decreased range of motion of left shoulder 09/03/2020   Depression    Dizzy 03/07/2020   bradycardia   Edentulous 09/03/2020   GERD (gastroesophageal reflux disease)    Hemiparesis affecting left side as late effect of cerebrovascular accident (CVA) ([place]) 03/08/2020   Hemorrhoids    History of Falls with injury 03/08/2020   Fall resulting in left humeral fracture   Hyperlipidemia    Hypertension    Hypothyroidism    Intracranial vascular stenosis    Lacunar infarction ([place])    Brain MRI 07/2021 multiple small remote pontine lacunar infarcts   Near syncope 03/08/2020   Paroxysmal atrial fibrillation ([facility])    Presence of permanent cardiac pacemaker    Proteinuria 03/08/2020   Seasonal allergies    Stroke ([place])    Thyroid cancer ([place])    per pt S/p Total Thyroidectomy with Radioactive Iodine Therapy   Type 1 diabetes mellitus ([place])    Past Surgical History:  Procedure Laterality Date   ABDOMINAL ANGIOGRAM N/A 05/14/2012   Procedure: ABDOMINAL ANGIOGRAM;  Surgeon: Laverda Page, MD;  Location: [facility] CATH LAB;  Service: Cardiovascular;  Laterality: N/A;   ABDOMINAL HYSTERECTOMY  1995   partial   ANKLE FRACTURE SURGERY Right    CARDIAC CATHETERIZATION     with coronary angiogram   cataract  Bilateral 2018   COLONOSCOPY N/A 09/09/2014   Procedure: COLONOSCOPY;  Surgeon: Gatha Mayer, MD;  Location: [place];  Service: Endoscopy;  Laterality: N/A;   ESOPHAGOGASTRODUODENOSCOPY (EGD) WITH PROPOFOL N/A 12/03/2018   Procedure: ESOPHAGOGASTRODUODENOSCOPY (EGD) WITH PROPOFOL;  Surgeon: Doran Stabler, MD;  Location: [place];  Service: Gastroenterology;  Laterality: N/A;   LEFT AND RIGHT HEART  CATHETERIZATION WITH CORONARY ANGIOGRAM N/A 05/14/2012   Procedure: LEFT AND RIGHT HEART CATHETERIZATION WITH CORONARY ANGIOGRAM;  Surgeon: Laverda Page, MD;  Location[facility] CATH LAB;  Service: Cardiovascular;  Laterality: N/A;   LOOP RECORDER INSERTION N/A 12/23/2018   Procedure: LOOP RECORDER INSERTION;  Surgeon: Constance Haw, MD;  Location: [place] CV LAB;  Service: Cardiovascular;  Laterality: N/A;   LOOP RECORDER REMOVAL N/A 06/25/2020   Procedure: LOOP RECORDER REMOVAL;  Surgeon: Constance Haw, MD;  Location: [place] CV LAB;  Service: Cardiovascular;  Laterality: N/A;   MINI METER   09/08/2014   ELECTRONIC INSULIN PUMP   PACEMAKER IMPLANT N/A 06/25/2020   Procedure: PACEMAKER IMPLANT;  Surgeon: Constance Haw, MD;  Location: [place] CV LAB;  Service: Cardiovascular;  Laterality: N/A;   TEE WITHOUT CARDIOVERSION N/A 10/16/2018   Procedure: TRANSESOPHAGEAL ECHOCARDIOGRAM (TEE);  Surgeon: Buford Dresser, MD;  Location: [facility] ENDOSCOPY;  Service: Cardiovascular;  Laterality: N/A;   THYROIDECTOMY  2006   for thyroid cancer     A IV Location/Drains/Wounds Patient Lines/Drains/Airways Status     Active Line/Drains/Airways     Name Placement date Placement time Site Days   Peripheral IV 02/10/22 20 G Posterior;Right Forearm 02/10/22  1221  Forearm  less than 1   External Urinary Catheter 02/10/22  1245  --  less than 1   Incision (Closed) 06/25/20 Chest Right 06/25/20  1900  -- 595   Incision (Closed) 06/25/20 Chest Right;Lower 06/25/20  1900  -- 595   Pressure Injury 08/10/21 Heel Left;Lateral Unstageable - Full thickness tissue loss in which the base of the injury is covered by slough (yellow, tan, gray, green or brown) and/or eschar (tan, brown or black) in the wound bed. 08/10/21  --  -- 184            Intake/Output Last 24 hours  Intake/Output Summary (Last 24 hours) at 02/10/2022 2125 Last data filed at 02/10/2022 1906 Gross per 24  hour  Intake --  Output 1375 ml  Net -1375 ml    Labs/Imaging Results for orders placed or performed during the hospital encounter of 02/10/22 (from the past 48 hour(s))  CBC with Differential     Status: Abnormal   Collection Time: 02/10/22 12:12 PM  Result Value Ref Range   WBC 5.0 4.0 - 10.5 K/uL   RBC 3.76 (L) 3.87 - 5.11 MIL/uL   Hemoglobin 10.6 (L) 12.0 - 15.0 g/dL   HCT 34.4 (L) 36.0 - 46.0 %   MCV 91.5 80.0 - 100.0 fL   MCH 28.2 26.0 - 34.0 pg   MCHC 30.8 30.0 - 36.0 g/dL   RDW 15.1 11.5 - 15.5 %   Platelets 184 150 - 400 K/uL   nRBC 0.0 0.0 - 0.2 %   Neutrophils Relative % 66 %   Neutro Abs 3.3 1.7 - 7.7 K/uL   Lymphocytes Relative 20 %   Lymphs Abs 1.0 0.7 - 4.0  K/uL   Monocytes Relative 13 %   Monocytes Absolute 0.6 0.1 - 1.0 K/uL   Eosinophils Relative 1 %   Eosinophils Absolute 0.1 0.0 - 0.5 K/uL   Basophils Relative 0 %   Basophils Absolute 0.0 0.0 - 0.1 K/uL   Immature Granulocytes 0 %   Abs Immature Granulocytes 0.01 0.00 - 0.07 K/uL    Comment: Performed at [facility] [address], [place], [place] [address]  Comprehensive metabolic panel     Status: Abnormal   Collection Time: 02/10/22 12:12 PM  Result Value Ref Range   Sodium 140 135 - 145 mmol/L   Potassium 3.8 3.5 - 5.1 mmol/L   Chloride 104 98 - 111 mmol/L   CO2 24 22 - 32 mmol/L   Glucose, Bld 95 70 - 99 mg/dL    Comment: Glucose reference range applies only to samples taken after fasting for at least 8 hours.   BUN 15 8 - 23 mg/dL   Creatinine, Ser 1.10 (H) 0.44 - 1.00 mg/dL   Calcium 8.8 (L) 8.9 - 10.3 mg/dL   Total Protein 6.9 6.5 - 8.1 g/dL   Albumin 3.0 (L) 3.5 - 5.0 g/dL   AST 20 15 - 41 U/L   ALT 12 0 - 44 U/L   Alkaline Phosphatase 76 38 - 126 U/L   Total Bilirubin 1.8 (H) 0.3 - 1.2 mg/dL   GFR, Estimated 54 (L) >60 mL/min    Comment: (NOTE) Calculated using the CKD-EPI Creatinine Equation (2021)    Anion gap 12 5 - 15    Comment: Performed at [facility],  [place] [address] [place], [place] [address]  Brain natriuretic peptide     Status: Abnormal   Collection Time: 02/10/22 12:12 PM  Result Value Ref Range   B Natriuretic Peptide 289.5 (H) 0.0 - 100.0 pg/mL    Comment: Performed at [facility] [address], [place], [place] [address]  Troponin I (High Sensitivity)     Status: None   Collection Time: 02/10/22 12:12 PM  Result Value Ref Range   Troponin I (High Sensitivity) 17 <18 ng/L    Comment: (NOTE) Elevated high sensitivity troponin I (hsTnI) values and significant  changes across serial measurements may suggest ACS but many other  chronic and acute conditions are known to elevate hsTnI results.  Refer to the "Links" section for chest pain algorithms and additional  guidance. Performed at [facility], [place] [address], [place], [place] [address]   Resp Panel by RT-PCR (Flu A&B, Covid) Nasopharyngeal Swab     Status: None   Collection Time: 02/10/22  1:13 PM   Specimen: Nasopharyngeal Swab; Nasopharyngeal(NP) swabs in vial transport medium  Result Value Ref Range   SARS Coronavirus 2 by RT PCR NEGATIVE NEGATIVE    Comment: (NOTE) SARS-CoV-2 target nucleic acids are NOT DETECTED.  The SARS-CoV-2 RNA is generally detectable in upper respiratory specimens during the acute phase of infection. The lowest concentration of SARS-CoV-2 viral copies this assay can detect is 138 copies/mL. A negative result does not preclude SARS-Cov-2 infection and should not be used as the sole basis for treatment or other patient management decisions. A negative result may occur with  improper specimen collection/handling, submission of specimen other than nasopharyngeal swab, presence of viral mutation(s) within the areas targeted by this assay, and inadequate number of viral copies(<138 copies/mL). A negative result must be combined with clinical observations, patient history, and epidemiological information.  The expected result is  Negative.  Fact Sheet for Patients:  EntrepreneurPulse.com.au  Fact Sheet for Healthcare Providers:  IncredibleEmployment.be  This test is no t yet approved or cleared by the [place] FDA and  has been authorized for detection and/or diagnosis of SARS-CoV-2 by FDA under an Emergency Use Authorization (EUA). This EUA will remain  in effect (meaning this test can be used) for the duration of the COVID-19 declaration under Section 564(b)(1) of the Act, 21 U.S.C.section 360bbb-3(b)(1), unless the authorization is terminated  or revoked sooner.       Influenza A by PCR NEGATIVE NEGATIVE   Influenza B by PCR NEGATIVE NEGATIVE    Comment: (NOTE) The Xpert Xpress SARS-CoV-2/FLU/RSV plus assay is intended as an aid in the diagnosis of influenza from Nasopharyngeal swab specimens and should not be used as a sole basis for treatment. Nasal washings and aspirates are unacceptable for Xpert Xpress SARS-CoV-2/FLU/RSV testing.  Fact Sheet for Patients: EntrepreneurPulse.com.au  Fact Sheet for Healthcare Providers: IncredibleEmployment.be  This test is not yet approved or cleared by the [place] FDA and has been authorized for detection and/or diagnosis of SARS-CoV-2 by FDA under an Emergency Use Authorization (EUA). This EUA will remain in effect (meaning this test can be used) for the duration of the COVID-19 declaration under Section 564(b)(1) of the Act, 21 U.S.C. section 360bbb-3(b)(1), unless the authorization is terminated or revoked.  Performed at [facility], [place] [address] [place], [place] [address]   Troponin I (High Sensitivity)     Status: Abnormal   Collection Time: 02/10/22  2:55 PM  Result Value Ref Range   Troponin I (High Sensitivity) 20 (H) <18 ng/L    Comment: (NOTE) Elevated high sensitivity troponin I (hsTnI) values and significant  changes across serial measurements may  suggest ACS but many other  chronic and acute conditions are known to elevate hsTnI results.  Refer to the "Links" section for chest pain algorithms and additional  guidance. Performed at [facility], [place] [address] [place], [place] [address]   CBG monitoring, ED     Status: Abnormal   Collection Time: 02/10/22  6:00 PM  Result Value Ref Range   Glucose-Capillary 68 (L) 70 - 99 mg/dL    Comment: Glucose reference range applies only to samples taken after fasting for at least 8 hours.  CBG monitoring, ED     Status: None   Collection Time: 02/10/22  6:25 PM  Result Value Ref Range   Glucose-Capillary 71 70 - 99 mg/dL    Comment: Glucose reference range applies only to samples taken after fasting for at least 8 hours.  CBG monitoring, ED     Status: Abnormal   Collection Time: 02/10/22  8:03 PM  Result Value Ref Range   Glucose-Capillary 103 (H) 70 - 99 mg/dL    Comment: Glucose reference range applies only to samples taken after fasting for at least 8 hours.   *Note: Due to a large number of results and/or encounters for the requested time period, some results have not been displayed. A complete set of results can be found in Results Review.   DG Chest Portable 1 View  Result Date: 02/10/2022 CLINICAL DATA:  Shortness of breath, leg swelling EXAM: PORTABLE CHEST 1 VIEW COMPARISON:  Portable exam 1318 hours compared to 10/31/2021 FINDINGS: LEFT subclavian sequential transvenous pacemaker leads project at RIGHT atrium and RIGHT ventricle. Enlargement of cardiac silhouette with pulmonary vascular congestion. Mediastinal contours normal. Atherosclerotic  calcification aorta. Chronic accentuation of LEFT perihilar and basilar markings. No acute infiltrate, pleural effusion, or pneumothorax. Osseous demineralization with displaced fracture of the lateral LEFT sixth rib. The list pacemaker projects over cardiac apex. IMPRESSION: Chronic changes. No acute abnormalities. Electronically Signed    By: Lavonia Dana M.D.   On: 02/10/2022 13:31    Pending Labs Unresulted Labs (From admission, onward)     Start     Ordered   02/11/22 0500  TSH  Tomorrow morning,   R        02/10/22 1650   02/11/22 0500  CBC  Tomorrow morning,   R        02/10/22 1650   02/11/22 0500  Comprehensive metabolic panel  Tomorrow morning,   R        02/10/22 1650   02/10/22 1659  Urinalysis, Routine w reflex microscopic Urine, Clean Catch  Once,   R        02/10/22 1658   02/10/22 1656  Hemoglobin A1c  Once,   R       Comments: To assess prior glycemic control    02/10/22 1655            Vitals/Pain Today's Vitals   02/10/22 1800 02/10/22 1850 02/10/22 1932 02/10/22 1945  BP: 140/70 106/81 (!) 139/46 (!) 124/50  Pulse: (!) 56 (!) 54 (!) 58 (!) 52  Resp: 11 19 20  (!) 21  Temp:      TempSrc:      SpO2: 94% 97% 98% 98%  Weight:      Height:      PainSc:        Isolation Precautions No active isolations  Medications Medications  amLODipine (NORVASC) tablet 2.5 mg (has no administration in time range)  atorvastatin (LIPITOR) tablet 80 mg (80 mg Oral Given 02/10/22 1952)  metoprolol succinate (TOPROL-XL) 24 hr tablet 50 mg (has no administration in time range)  busPIRone (BUSPAR) tablet 5 mg (5 mg Oral Given 02/10/22 1952)  levothyroxine (SYNTHROID) tablet 175 mcg (has no administration in time range)  pantoprazole (PROTONIX) EC tablet 40 mg (has no administration in time range)  apixaban (ELIQUIS) tablet 5 mg (has no administration in time range)  gabapentin (NEURONTIN) capsule 300 mg (300 mg Oral Given 02/10/22 1952)  fluticasone (FLONASE) 50 MCG/ACT nasal spray 1 spray (1 spray Each Nare Not Given 02/10/22 1954)  insulin pump 1 each (has no administration in time range)  furosemide (LASIX) injection 40 mg (40 mg Intravenous Given 02/10/22 1453)    Mobility walks with device High fall risk   Focused Assessments Pulmonary Assessment Handoff: O2 Device: Nasal Cannula O2 Flow Rate  (L/min): 3 L/min    R Recommendations: See Admitting Provider Note  Report given to: Garrison Columbus RN  Additional Notes:

## 2022-02-10 NOTE — ED Notes (Signed)
Pt rang out to say that her dexcom was reading at 138 and that was low for her.  When I checked it with our CBG machine it was 68. Pt provided OJ, Graham crackers and PB,.

## 2022-02-10 NOTE — ED Provider Notes (Signed)
[facility] EMERGENCY DEPARTMENT Provider Note   CSN: [id number] Arrival date & time: 02/10/22  1202     History  Chief Complaint  Patient presents with   Leg Swelling    [name] is a 71 y.o. female.  HPI     71 year old female with a history hypertension, diabetes type 1, hyperlipidemia, thyroid cancer status post thyroidectomy, stroke, GERD, heart failure with preserved ejection fraction, atrial ectopy, atrial fibrillation on Eliquis, symptomatic bradycardia now status post pacemaker placement who presents with concern for worsening lower extremity edema, and shortness of breath.  She has had progressive edema in the bilateral lower extremities which has worsened over the past 6 months.  She recently changed from Lasix to torsemide per triage note-pt states she is on furosemide 20mg  BID. She has had orthopnea over the last few weeks.  On arrival to the emergency department, oxygenation is 89 to 91% on room air and she was placed on oxygen.  Reports over the last 6 months has had worsening lower extremity swelling despite taking furosemide twice a day.  About a week ago she had cough congestion and subjective fever.  Reports the symptoms have resolved, but she has had increasing shortness of breath.  Over the last several weeks she has had orthopnea, but this is getting worse.  Yesterday she began to have shortness of breath that is continued throughout today.  Denies fever in the last couple days, denies chest pain with exception of feeling of chest fullness and gas on arrival to the emergency department.  Reports she has had cough productive of yellow mucus over the past 2 weeks.  Denies abdominal pain, nausea, vomiting, diarrhea.  Past Medical History:  Diagnosis Date   Abnormal echocardiogram    Anemia    Arthritis    BPPV (benign paroxysmal positional vertigo), right 06/13/2017   Bradycardia 03/07/2020   Cerebrovascular accident (CVA) due  to thrombosis of basilar artery ([place]) 10/29/2015   CHB (complete heart block) ([place]) 03/08/2020   CHF (congestive heart failure) ([place])    Closed fracture of left proximal humerus 07/08/2019   Decreased range of motion of left shoulder 09/03/2020   Depression    Dizzy 03/07/2020   bradycardia   Edentulous 09/03/2020   GERD (gastroesophageal reflux disease)    Hemiparesis affecting left side as late effect of cerebrovascular accident (CVA) ([place]) 03/08/2020   Hemorrhoids    History of Falls with injury 03/08/2020   Fall resulting in left humeral fracture   Hyperlipidemia    Hypertension    Hypothyroidism    Intracranial vascular stenosis    Lacunar infarction ([place])    Brain MRI 07/2021 multiple small remote pontine lacunar infarcts   Near syncope 03/08/2020   Paroxysmal atrial fibrillation (HCC)    Presence of permanent cardiac pacemaker    Proteinuria 03/08/2020   Seasonal allergies    Stroke ([place])    Thyroid cancer ([employer])    per pt S/p Total Thyroidectomy with Radioactive Iodine Therapy   Type 1 diabetes mellitus ([place])      Home Medications Prior to Admission medications   Medication Sig Start Date End Date Taking? Authorizing Provider  fluticasone (FLONASE) 50 MCG/ACT nasal spray Place 1 spray into both nostrils daily. 02/02/22   Lurline Del, DO  acetaminophen (TYLENOL) 325 MG tablet Take 2 tablets (650 mg total) by mouth every 6 (six) hours as needed for moderate pain. 08/30/21   Angiulli, Lavon Paganini, PA-C  amLODipine (NORVASC) 5 MG  tablet Take 0.5 tablets (2.5 mg total) by mouth daily. 01/20/22 02/19/22  Baldwin Jamaica, PA-C  apixaban (ELIQUIS) 5 MG TABS tablet Take 1 tablet (5 mg total) by mouth 2 (two) times daily. 11/16/21   [name], [name], MD  aspirin EC 81 MG tablet Aspirin 81 mg Patient not taking: Reported on 01/20/2022    [provider]  atorvastatin (LIPITOR) 80 MG tablet Take 1 tablet (80 mg total) by mouth every evening. 11/16/21   [name],  Makiera, MD  busPIRone (BUSPAR) 5 MG tablet Take 1 tablet (5 mg total) by mouth 3 (three) times daily. 12/07/21   [name], [name], MD  fexofenadine (ALLEGRA) 180 MG tablet Take 1 tablet (180 mg total) by mouth daily as needed for allergies or rhinitis. Patient not taking: Reported on 01/20/2022 10/27/21   Medina-Vargas, Monina C, NP  FLUoxetine (PROZAC) 10 MG capsule Take 1 capsule (10 mg total) by mouth daily. 11/16/21   [name], [name], MD  FLUoxetine (PROZAC) 20 MG capsule Take 1 capsule (20 mg total) by mouth daily. 11/16/21   [name], [name], MD  furosemide (LASIX) 40 MG tablet Take 1 tablet (40 mg total) by mouth 2 (two) times daily. 01/20/22   Baldwin Jamaica, PA-C  gabapentin (NEURONTIN) 300 MG capsule Take 1 capsule (300 mg total) by mouth 3 (three) times daily. 11/16/21   [name], Makiera, MD  insulin aspart (NOVOLOG) 100 UNIT/ML injection Inject 5 Units into the skin 3 (three) times daily before meals. For blood sugar greater than or equal to 200 10/27/21   Medina-Vargas, Monina C, NP  Insulin Human (INSULIN PUMP) SOLN Inject 1 each into the skin 3 times daily with meals, bedtime and 2 AM. 08/10/21   Sharion Settler, DO  ISOSORBIDE PO Isosorbide    [provider]  levothyroxine (SYNTHROID) 175 MCG tablet Take 1 tablet (175 mcg total) by mouth daily before breakfast. 11/16/21   [name], [name], MD  LOVASTATIN PO Lovastatin    [provider]  metoprolol succinate (TOPROL-XL) 50 MG 24 hr tablet Take 1 tablet (50 mg total) by mouth in the morning and at bedtime. Take with or immediately following a meal. 01/20/22 04/20/22  Baldwin Jamaica, PA-C  Multiple Vitamins-Minerals (OCUVITE PO) Take 1 tablet by mouth daily. Patient not taking: Reported on 01/20/2022    [provider]  nystatin (MYCOSTATIN) 100000 UNIT/ML suspension Take 5 mLs (500,000 Units total) by mouth 4 (four) times daily. Patient not taking: Reported  on 01/20/2022 11/18/21   Rise Patience, DO  OMEPRAZOLE PO Omeprazole Patient not taking: Reported on 01/20/2022    [provider]  pantoprazole (PROTONIX) 40 MG tablet Take 1 tablet (40 mg total) by mouth 2 (two) times daily. 10/27/21   Medina-Vargas, Monina C, NP  polyethylene glycol powder (GLYCOLAX/MIRALAX) 17 GM/SCOOP powder Take 17 g by mouth daily. Patient taking differently: Take 17 g by mouth as needed. 10/27/21   Medina-Vargas, Monina C, NP  potassium chloride (KLOR-CON) 10 MEQ tablet Take 1 tablet (10 mEq total) by mouth daily. 10/27/21   Medina-Vargas, Monina C, NP      Allergies    Latex    Review of Systems   Review of Systems  Physical Exam Updated Vital Signs BP 125/64    Pulse (!) 53    Temp 98.7 F (37.1 C) (Oral)    Resp (!) 28    Ht 5\' 7"  (1.702 m)    Wt 104.3 kg    SpO2 99%    BMI 36.02  kg/m  Physical Exam Vitals and nursing note reviewed.  Constitutional:      General: She is not in acute distress.    Appearance: She is well-developed. She is not diaphoretic.  HENT:     Head: Normocephalic and atraumatic.  Eyes:     Conjunctiva/sclera: Conjunctivae normal.  Neck:     Vascular: JVD present.  Cardiovascular:     Rate and Rhythm: Normal rate and regular rhythm.     Heart sounds: Normal heart sounds. No murmur heard.   No friction rub. No gallop.  Pulmonary:     Effort: Pulmonary effort is normal. No respiratory distress.     Breath sounds: Normal breath sounds. No wheezing or rales.  Abdominal:     General: There is no distension.     Palpations: Abdomen is soft.     Tenderness: There is no abdominal tenderness. There is no guarding.  Musculoskeletal:        General: No tenderness.     Cervical back: Normal range of motion.     Right lower leg: Edema present.     Left lower leg: Edema present.  Skin:    General: Skin is warm and dry.     Findings: No erythema or rash.     Comments: Wound LLE without erythema  Neurological:     Mental Status:  She is alert and oriented to person, place, and time.    ED Results / Procedures / Treatments   Labs (all labs ordered are listed, but only abnormal results are displayed) Labs Reviewed  CBC WITH DIFFERENTIAL/PLATELET - Abnormal; Notable for the following components:      Result Value   RBC 3.76 (*)    Hemoglobin 10.6 (*)    HCT 34.4 (*)    All other components within normal limits  COMPREHENSIVE METABOLIC PANEL - Abnormal; Notable for the following components:   Creatinine, Ser 1.10 (*)    Calcium 8.8 (*)    Albumin 3.0 (*)    Total Bilirubin 1.8 (*)    GFR, Estimated 54 (*)    All other components within normal limits  BRAIN NATRIURETIC PEPTIDE - Abnormal; Notable for the following components:   B Natriuretic Peptide 289.5 (*)    All other components within normal limits  RESP PANEL BY RT-PCR (FLU A&B, COVID) ARPGX2  TROPONIN I (HIGH SENSITIVITY)    EKG EKG Interpretation  Date/Time:  Friday February 10 2022 12:10:04 EST Ventricular Rate:  71 PR Interval:  200 QRS Duration: 170 QT Interval:  501 QTC Calculation: 545 R Axis:   123 Text Interpretation: Intermittent paced rhythm Lateral infarct, age indeterminate Probable anteroseptal infarct, recent Confirmed by Gareth Morgan [phone]) on 02/10/2022 2:24:58 PM  Radiology DG Chest Portable 1 View  Result Date: 02/10/2022 CLINICAL DATA:  Shortness of breath, leg swelling EXAM: PORTABLE CHEST 1 VIEW COMPARISON:  Portable exam 1318 hours compared to 10/31/2021 FINDINGS: LEFT subclavian sequential transvenous pacemaker leads project at RIGHT atrium and RIGHT ventricle. Enlargement of cardiac silhouette with pulmonary vascular congestion. Mediastinal contours normal. Atherosclerotic calcification aorta. Chronic accentuation of LEFT perihilar and basilar markings. No acute infiltrate, pleural effusion, or pneumothorax. Osseous demineralization with displaced fracture of the lateral LEFT sixth rib. The list pacemaker projects over  cardiac apex. IMPRESSION: Chronic changes. No acute abnormalities. Electronically Signed   By: Lavonia Dana M.D.   On: 02/10/2022 13:31    Procedures Procedures    Medications Ordered in ED Medications  furosemide (LASIX) injection 40 mg (has  no administration in time range)    ED Course/ Medical Decision Making/ A&P                           Medical Decision Making Amount and/or Complexity of Data Reviewed Labs: ordered. Radiology: ordered.  Risk Prescription drug management.    71 year old female with a history hypertension, diabetes type 1, hyperlipidemia, thyroid cancer status post thyroidectomy, stroke, GERD, heart failure with preserved ejection fraction, atrial ectopy, atrial fibrillation on Eliquis, symptomatic bradycardia now status post pacemaker placement who presents with concern for worsening lower extremity edema, and shortness of breath.  Differential diagnosis for dyspnea includes ACS, PE, COPD exacerbation, CHF exacerbation, anemia, pneumonia, viral etiology such as COVID 19 infection, metabolic abnormality.    Chest x-ray was done and personally interpreted by me which showed chronic changes.   EKG was evaluated by me which showed wide complex consistent with pacer history.  BNP was289, more elevated from previously.   Troponin negative, doubt ACS.  She has been compliant with Eliquis and have low suspicion for pulmonary embolus. No fever, no leukocytosis, doubt pneumonia.     COVID/flu testing pending. Suspect likely CHF exacerbation as etiology of shortness of breath with hypoxia given leg swelling, orthopnea, elevated BNP in comparison to prior. Ordered lasix 40mg , will admit for further care.        Final Clinical Impression(s) / ED Diagnoses Final diagnoses:  Shortness of breath  Hypoxia    Rx / DC Orders ED Discharge Orders     None         Gareth Morgan, MD 02/10/22 1429

## 2022-02-10 NOTE — ED Notes (Signed)
[place] sandwhich and more oj provided

## 2022-02-10 NOTE — Progress Notes (Signed)
Patient still not wanting to go on CPAP at this time. RN to message/call when patient is ready.

## 2022-02-11 ENCOUNTER — Inpatient Hospital Stay (HOSPITAL_COMMUNITY): Payer: Medicare PPO

## 2022-02-11 DIAGNOSIS — R0602 Shortness of breath: Secondary | ICD-10-CM | POA: Diagnosis not present

## 2022-02-11 LAB — COMPREHENSIVE METABOLIC PANEL
ALT: 10 U/L (ref 0–44)
AST: 15 U/L (ref 15–41)
Albumin: 2.8 g/dL — ABNORMAL LOW (ref 3.5–5.0)
Alkaline Phosphatase: 72 U/L (ref 38–126)
Anion gap: 10 (ref 5–15)
BUN: 15 mg/dL (ref 8–23)
CO2: 25 mmol/L (ref 22–32)
Calcium: 8.7 mg/dL — ABNORMAL LOW (ref 8.9–10.3)
Chloride: 106 mmol/L (ref 98–111)
Creatinine, Ser: 1.11 mg/dL — ABNORMAL HIGH (ref 0.44–1.00)
GFR, Estimated: 53 mL/min — ABNORMAL LOW (ref >60)
Glucose, Bld: 106 mg/dL — ABNORMAL HIGH (ref 70–99)
Potassium: 3.6 mmol/L (ref 3.5–5.1)
Sodium: 141 mmol/L (ref 135–145)
Total Bilirubin: 1.2 mg/dL (ref 0.3–1.2)
Total Protein: 6.8 g/dL (ref 6.5–8.1)

## 2022-02-11 LAB — URINALYSIS, ROUTINE W REFLEX MICROSCOPIC
Bilirubin Urine: NEGATIVE
Glucose, UA: NEGATIVE mg/dL
Hgb urine dipstick: NEGATIVE
Ketones, ur: NEGATIVE mg/dL
Nitrite: NEGATIVE
Protein, ur: 30 mg/dL — AB
Specific Gravity, Urine: 1.017 (ref 1.005–1.030)
WBC, UA: >50 WBC/hpf — ABNORMAL HIGH (ref 0–5)
pH: 5 (ref 5.0–8.0)

## 2022-02-11 LAB — GLUCOSE, CAPILLARY
Glucose-Capillary: 110 mg/dL — ABNORMAL HIGH (ref 70–99)
Glucose-Capillary: 110 mg/dL — ABNORMAL HIGH (ref 70–99)
Glucose-Capillary: 111 mg/dL — ABNORMAL HIGH (ref 70–99)
Glucose-Capillary: 117 mg/dL — ABNORMAL HIGH (ref 70–99)
Glucose-Capillary: 130 mg/dL — ABNORMAL HIGH (ref 70–99)
Glucose-Capillary: 143 mg/dL — ABNORMAL HIGH (ref 70–99)

## 2022-02-11 LAB — CBC
HCT: 33 % — ABNORMAL LOW (ref 36.0–46.0)
Hemoglobin: 10.1 g/dL — ABNORMAL LOW (ref 12.0–15.0)
MCH: 27.9 pg (ref 26.0–34.0)
MCHC: 30.6 g/dL (ref 30.0–36.0)
MCV: 91.2 fL (ref 80.0–100.0)
Platelets: 185 10*3/uL (ref 150–400)
RBC: 3.62 MIL/uL — ABNORMAL LOW (ref 3.87–5.11)
RDW: 15 % (ref 11.5–15.5)
WBC: 6.1 10*3/uL (ref 4.0–10.5)
nRBC: 0 % (ref 0.0–0.2)

## 2022-02-11 LAB — HEMOGLOBIN A1C
Hgb A1c MFr Bld: 8.3 % — ABNORMAL HIGH (ref 4.8–5.6)
Mean Plasma Glucose: 191.51 mg/dL

## 2022-02-11 LAB — MAGNESIUM: Magnesium: 1.7 mg/dL (ref 1.7–2.4)

## 2022-02-11 LAB — TSH: TSH: 3.228 u[IU]/mL (ref 0.350–4.500)

## 2022-02-11 MED ORDER — FUROSEMIDE 10 MG/ML IJ SOLN
40.0000 mg | Freq: Once | INTRAMUSCULAR | Status: AC
  Administered 2022-02-11: 40 mg via INTRAVENOUS
  Filled 2022-02-11: qty 4

## 2022-02-11 MED ORDER — FOOD THICKENER (SIMPLYTHICK): 1.0000 | ORAL | Status: DC | PRN

## 2022-02-11 MED ORDER — FUROSEMIDE 40 MG PO TABS
40.0000 mg | ORAL_TABLET | Freq: Once | ORAL | Status: AC
  Administered 2022-02-11: 40 mg via ORAL
  Filled 2022-02-11: qty 1

## 2022-02-11 MED ORDER — CEPHALEXIN 250 MG PO CAPS
500.0000 mg | ORAL_CAPSULE | Freq: Three times a day (TID) | ORAL | Status: DC
  Administered 2022-02-11 – 2022-02-15 (×12): 500 mg via ORAL
  Filled 2022-02-11 (×12): qty 2

## 2022-02-11 MED ORDER — FUROSEMIDE 40 MG PO TABS
40.0000 mg | ORAL_TABLET | Freq: Two times a day (BID) | ORAL | Status: DC
  Administered 2022-02-11: 40 mg via ORAL
  Filled 2022-02-11: qty 1

## 2022-02-11 MED ORDER — LORATADINE 10 MG PO TABS
10.0000 mg | ORAL_TABLET | Freq: Every day | ORAL | Status: DC
  Administered 2022-02-11 – 2022-02-15 (×5): 10 mg via ORAL
  Filled 2022-02-11 (×5): qty 1

## 2022-02-11 MED ORDER — ACETAMINOPHEN 325 MG PO TABS
650.0000 mg | ORAL_TABLET | Freq: Four times a day (QID) | ORAL | Status: DC | PRN
  Administered 2022-02-11 – 2022-02-15 (×2): 650 mg via ORAL
  Filled 2022-02-11 (×2): qty 2

## 2022-02-11 NOTE — Evaluation (Signed)
Occupational Therapy Evaluation Patient Details Name: [name] MRN: [id number] DOB: [birth year] Today's Date: 02/11/2022   History of Present Illness 71 y.o. female presenting with hypoxic respiratory failure and BLE edema. PMH is significant for GERD, HFpEF, OSA, DM1, Hypothyroidism, HTN, HLD, pAF, h/o CVA (2016 and 2019), thyroid cancer s/p thyroidectomy   Clinical Impression   Patient admitted for the above diagnosis.  At home, she has assist from family, and an aide M-W-F for 3 hours/day to assist with bathing and dressing.  Patient is currently using a hoyer lift at home, but is wanting to begin walking at a platform level.  Given the level of assist at home, she is probably close to baseline, but OT can follow to improve transfers and upper ADL from a seated level.  The patient wishes to return home with increased aide help to 5 times/wk and prior assist from family.  HH OT could be considered if needed.       Recommendations for follow up therapy are one component of a multi-disciplinary discharge planning process, led by the attending physician.  Recommendations may be updated based on patient status, additional functional criteria and insurance authorization.   Follow Up Recommendations  No OT follow up    Assistance Recommended at Discharge Frequent or constant Supervision/Assistance  Patient can return home with the following      Functional Status Assessment  Patient has had a recent decline in their functional status and demonstrates the ability to make significant improvements in function in a reasonable and predictable amount of time.  Equipment Recommendations  None recommended by OT    Recommendations for Other Services       Precautions / Restrictions Precautions Precautions: Fall Precaution Comments: Watch O2 Restrictions Weight Bearing Restrictions: No  Watch insulin pump line.       Mobility Bed Mobility Overal bed mobility: Needs  Assistance Bed Mobility: Supine to Sit     Supine to sit: Mod assist, HOB elevated     General bed mobility comments: a lot of assist to scoot to EOB, heavy use of SR's Patient Response: Cooperative  Transfers Overall transfer level: Needs assistance Equipment used: 1 person hand held assist Transfers: Bed to chair/wheelchair/BSC     Squat pivot transfers: Max assist       General transfer comment: Hoyer pad placed behind patient      Balance Overall balance assessment: Needs assistance Sitting-balance support: Feet supported, Bilateral upper extremity supported Sitting balance-Leahy Scale: Fair   Postural control: Posterior lean   Standing balance-Leahy Scale: Zero                             ADL either performed or assessed with clinical judgement   ADL       Grooming: Wash/dry hands;Wash/dry face;Set up;Bed level           Upper Body Dressing : Moderate assistance;Bed level       Toilet Transfer: Maximal assistance;Squat-pivot                   Vision Patient Visual Report: No change from baseline       Perception Perception Perception: Within Functional Limits   Praxis Praxis Praxis: Intact    Pertinent Vitals/Pain Pain Assessment Pain Assessment: No/denies pain     Hand Dominance Right   Extremity/Trunk Assessment Upper Extremity Assessment Upper Extremity Assessment: Generalized weakness;RUE deficits/detail;LUE deficits/detail RUE Deficits / Details: Prior CVA  RUE Sensation: WNL RUE Coordination: decreased fine motor;decreased gross motor LUE Deficits / Details: Prior CVA LUE Sensation: WNL LUE Coordination: decreased fine motor;decreased gross motor   Lower Extremity Assessment Lower Extremity Assessment: Defer to PT evaluation   Cervical / Trunk Assessment Cervical / Trunk Assessment: Kyphotic   Communication Communication Communication: No difficulties   Cognition Arousal/Alertness:  Awake/alert Behavior During Therapy: WFL for tasks assessed/performed Overall Cognitive Status: Within Functional Limits for tasks assessed                                       General Comments   Desats quickly on RA    Exercises     Shoulder Instructions      Home Living Family/patient expects to be discharged to:: Private residence Living Arrangements: Other relatives Available Help at Discharge: Family;Available PRN/intermittently Type of Home: House Home Access: Ramped entrance     Home Layout: Two level;Able to live on main level with bedroom/bathroom Alternate Level Stairs-Number of Steps: stays on the main level       Bathroom Toilet: Handicapped height Bathroom Accessibility: Yes How Accessible: Accessible via walker Home Equipment: Wheelchair - power;Wheelchair - manual;BSC/3in1   Additional Comments: hoyer lift.      Prior Functioning/Environment Prior Level of Function : Needs assist       Physical Assist : Mobility (physical);ADLs (physical) Mobility (physical): Transfers;Bed mobility ADLs (physical): Bathing;Dressing Mobility Comments: hoyer lift to w/c ADLs Comments: Pt reports needing assist with ADLs.  Aide on MWF, 3 hours... looking to increase for Tu and Thur        OT Problem List: Decreased strength;Impaired balance (sitting and/or standing);Impaired UE functional use;Decreased coordination;Decreased activity tolerance      OT Treatment/Interventions: Self-care/ADL training;Therapeutic exercise;Therapeutic activities;DME and/or AE instruction;Balance training;Patient/family education    OT Goals(Current goals can be found in the care plan section) Acute Rehab OT Goals Patient Stated Goal: Return home OT Goal Formulation: With patient Time For Goal Achievement: 02/24/22 Potential to Achieve Goals: Good ADL Goals Pt Will Perform Grooming: with set-up;sitting Pt Will Perform Upper Body Bathing: with min assist;sitting Pt  Will Perform Upper Body Dressing: with min assist;sitting Pt Will Transfer to Toilet: with mod assist;squat pivot transfer;stand pivot transfer;bedside commode  OT Frequency: Min 2X/week    Co-evaluation              AM-PAC OT "6 Clicks" Daily Activity     Outcome Measure Help from another person eating meals?: A Little Help from another person taking care of personal grooming?: A Little Help from another person toileting, which includes using toliet, bedpan, or urinal?: A Lot Help from another person bathing (including washing, rinsing, drying)?: A Lot Help from another person to put on and taking off regular upper body clothing?: A Lot Help from another person to put on and taking off regular lower body clothing?: Total 6 Click Score: 13   End of Session Equipment Utilized During Treatment: Oxygen;Gait belt Nurse Communication: Mobility status;Need for lift equipment  Activity Tolerance: Patient tolerated treatment well Patient left: in chair;with call bell/phone within reach;with nursing/sitter in room  OT Visit Diagnosis: Ataxia, unspecified (R27.0);Other abnormalities of gait and mobility (R26.89);Unsteadiness on feet (R26.81)                Time: 7494-4967 OT Time Calculation (min): 25 min Charges:  OT General Charges $OT Visit: 1 Visit OT Evaluation $OT Eval  Moderate Complexity: 1 Mod OT Treatments $Self Care/Home Management : 8-22 mins  02/11/2022  RP, OTR/L  Acute Rehabilitation Services  Office:  [phone]   Metta Clines 02/11/2022, 11:44 AM

## 2022-02-11 NOTE — Progress Notes (Signed)
Patient placed on CPAP at this time.  

## 2022-02-11 NOTE — Progress Notes (Signed)
FPTS Brief Progress Note  S:Doing well no concerns.    O: BP (!) 112/46 (BP Location: Right Arm)    Pulse (!) 51    Temp 98.6 F (37 C) (Oral)    Resp 20    Ht 5\' 7"  (1.702 m)    Wt 112.2 kg    SpO2 100%    BMI 38.74 kg/m   General: Appears well, no acute distress. Age appropriate. Cardiac: RRR, normal heart sounds, no murmurs Respiratory: Coarse breath sounds, normal effort Extremities: LE edema difficult to assess 2/2 venous stasis skin changes and tender to palpation. Neuro: alert and oriented Psych: normal affect  A/P: Acute hypoxemic respiratory failure   HFpEF Was on 3 L, weaned down to 1L. Continues to have coarse breath sounds. Will redose oral lasix 40 mg. Continue to wean O2 as tolerated.  - Orders reviewed. Labs for AM ordered, which was adjusted as needed.    Gerlene Fee, DO 02/11/2022, 9:02 PM PGY-3, [facility] Night Resident  Please page [phone] with questions.

## 2022-02-11 NOTE — Progress Notes (Signed)
FPTS Interim Progress Note  S: Went to check on patient and notify her that her urinalysis was positive for UTI.  Patient states she feels she is breathing okay on 3 L of oxygen.  She is asking for her home Claritin to be started here.  No other concerns at this time  O: BP (!) 147/52 (BP Location: Right Arm)    Pulse (!) 57    Temp 98.9 F (37.2 C) (Oral)    Resp 20    Ht 5\' 7"  (1.702 m)    Wt 112.2 kg    SpO2 96%    BMI 38.74 kg/m   General: 71 year old female lying comfortably in bed, NAD Heart: Bradycardic Lungs: Good air movement, faint crackles in bilateral lung bases Extremities: No edema of BLEs  A/P: UTI Urinalysis came back positive for UTI and patient is symptomatic with dysuria. -Keflex 500 mg 3 times daily for 7 days  Acute hypoxic respiratory failure   HFpEF Today patient has received 40 mg oral Lasix this morning along with 40 mg IV Lasix this afternoon.  Trialed patient off of oxygen while in the room which did not go well.  Patient desatted into the low to mid 80s when off of oxygen and did not recover into 90's until back on 3 L. On exam I heard faint crackles in bilateral lung bases.  Will ask night team to evaluate patient this evening to determine whether she should receive another dose of IV Lasix this evening versus oral Lasix.  Precious Gilding, DO 02/11/2022, 3:40 PM PGY-1, [facility] Service pager [phone]

## 2022-02-11 NOTE — Progress Notes (Signed)
Family Medicine Teaching Service Daily Progress Note Intern Pager: [phone]  Patient name: [name] Medical record number: [id number] Date of birth: 10-22-1951 Age: 71 y.o. Gender: female  Primary Care Provider: Eulis Foster, MD Consultants: None Code Status: Full  Pt Overview and Major Events to Date:  2/17 - Admitted with hypoxic respiratory failure  Assessment and Plan: [name] is a 71 y.o. female presenting with hypoxic respiratory failure and BLE edema. PMH is significant for GERD, HFpEF, OSA, DM1, Hypothyroidism, HTN, HLD, pAF, h/o CVA (2016 and 2019), thyroid cancer s/p thyroidectomy.   Acute hypoxemic respiratory failure   HFpEF Patient mains on 2L.  S/p IV Lasix 40 mg in the ER with subsequent output of 1524mL.  Creatinine mildly bumped to 1.11 (baseline appears to fluctuate between 1.1-1.2). - Follow-up echo - Restart home lasix dosing - Supplemental O2 to maintain sats > 90% - Daily weights - Strict I's and O's  Dysuria Continues to report dysuria. UA has not yet been collected, messaged the nurse to request it to be collected. - UA - Urine culture if UA concerning  T1DM A1c 8.3 - Continue insulin pump - CBG every 4 hours  HTN BP range 106-145/38-99, most recently 118/38. - Continue home amlodipine 2.5 mg daily - Continue home metoprolol 30 mg daily  GERD - Continue home Protonix twice daily  Hypothyroidism s/p thyroidectomy TSH appropriate at 3.228 - Continue home levothyroxine 162mcg  H/o CVA   chronic bradycardia s/p pacemaker   PAF - Continue home metoprolol (as long as HR can tolerate) - Eliquis 5 mg twice daily - Continue home Lipitor 80 mg  OSA - CPAP nightly  FEN/GI: Dysphagia 3 PPx: Home Eliquis 5 mg twice daily Dispo:Pending PT recommendations  pending clinical improvement .   Subjective:  Patient reports that she is feeling okay this morning, she notes that she is not on any oxygen at  home.  She does not feel like she urinated that much with her IV Lasix dosing.  She notes no lower extremity edema or her abdomen being any larger than usual. She does note pain on her right heel   Objective: Temp:  [98.4 F (36.9 C)-99.2 F (37.3 C)] 98.4 F (36.9 C) (02/18 0005) Pulse Rate:  [41-61] 60 (02/18 0235) Resp:  [11-29] 19 (02/18 0235) BP: (106-145)/(43-99) 122/43 (02/18 0005) SpO2:  [79 %-100 %] 94 % (02/18 0235) Weight:  [104.3 kg-112.4 kg] 112.4 kg (02/17 2221) Physical Exam: General: chronically ill-appearing female, NAD, supine in bed Cardiovascular: bradycardia, 2+ radial pulses Respiratory: breathing comfortably on 4L [place] and decreased it to 2L with patient maintaining saturations Abdomen: soft, non-tender, non-distended Extremities: chronic skin changes on lower extremity. Skin abrasion on lower left shin. TTP of the right heel (unable to visualize well). 1+ pitting edema   Laboratory: Recent Labs  Lab 02/10/22 1212 02/11/22 0032  WBC 5.0 6.1  HGB 10.6* 10.1*  HCT 34.4* 33.0*  PLT 184 185   Recent Labs  Lab 02/10/22 1212 02/11/22 0032  NA 140 141  K 3.8 3.6  CL 104 106  CO2 24 25  BUN 15 15  CREATININE 1.10* 1.11*  CALCIUM 8.8* 8.7*  PROT 6.9 6.8  BILITOT 1.8* 1.2  ALKPHOS 76 72  ALT 12 10  AST 20 15  GLUCOSE 95 106*    Imaging/Diagnostic Tests: DG Chest Portable 1 View  Result Date: 02/10/2022 CLINICAL DATA:  Shortness of breath, leg swelling EXAM: PORTABLE CHEST 1 VIEW COMPARISON:  Portable exam 1318 hours compared to 10/31/2021 FINDINGS: LEFT subclavian sequential transvenous pacemaker leads project at RIGHT atrium and RIGHT ventricle. Enlargement of cardiac silhouette with pulmonary vascular congestion. Mediastinal contours normal. Atherosclerotic calcification aorta. Chronic accentuation of LEFT perihilar and basilar markings. No acute infiltrate, pleural effusion, or pneumothorax. Osseous demineralization with displaced fracture of the  lateral LEFT sixth rib. The list pacemaker projects over cardiac apex. IMPRESSION: Chronic changes. No acute abnormalities. Electronically Signed   By: Lavonia Dana M.D.   On: 02/10/2022 13:31     Rise Patience, DO 02/11/2022, 3:16 AM PGY-2, [place] Intern pager: [phone], text pages welcome

## 2022-02-11 NOTE — Evaluation (Signed)
Physical Therapy Evaluation Patient Details Name: [name] MRN: [id number] DOB: 1951/06/11 Today's Date: 02/11/2022  History of Present Illness  71 y.o. female presenting with hypoxic respiratory failure and BLE edema. PMH is significant for GERD, HFpEF, OSA, DM1, Hypothyroidism, HTN, HLD, pAF, h/o CVA (2016 and 2019), thyroid cancer s/p thyroidectomy   Clinical Impression  Pt in bed upon arrival of PT, agreeable to evaluation at this time. Prior to admission the pt was dependent on use of hoyer lift for OOB transfers to power WC or BSC. She is highly motivated to improve LE strength to return to walking as she has not been ambulatory since admission in Sep 2022. The pt completed a lateral scoot transfer with max-totalA from PT to create some hip clearance to allow for movement, the pt was unable to power up through LE enough to complete any hip clearance despite maxA with attempts to complete sit-stand. The pt has all needed DME at home and assistance available from family. She is likely close to baseline mobility, but will benefit from acute PT and HHPT follow-up to make all possible progress towards greater independence with mobility and transfers.         Recommendations for follow up therapy are one component of a multi-disciplinary discharge planning process, led by the attending physician.  Recommendations may be updated based on patient status, additional functional criteria and insurance authorization.  Follow Up Recommendations Home health PT    Assistance Recommended at Discharge Frequent or constant Supervision/Assistance  Patient can return home with the following  Two people to help with walking and/or transfers;A lot of help with bathing/dressing/bathroom;Assistance with cooking/housework;Direct supervision/assist for medications management;Assist for transportation;Help with stairs or ramp for entrance    Equipment Recommendations None recommended by PT   Recommendations for Other Services       Functional Status Assessment Patient has not had a recent decline in their functional status     Precautions / Restrictions Precautions Precautions: Fall Precaution Comments: on 2L O2 for mobility Restrictions Weight Bearing Restrictions: No      Mobility  Bed Mobility Overal bed mobility: Needs Assistance Bed Mobility: Sit to Supine       Sit to supine: Min assist, Mod assist   General bed mobility comments: minA to trunk, modA to BLE to return to bed    Transfers Overall transfer level: Needs assistance Equipment used: 1 person hand held assist Transfers: Bed to chair/wheelchair/BSC       Squat pivot transfers: Max assist    Lateral/Scoot Transfers: Max assist General transfer comment: pt attempting to assist with stand/pivot but unable to generate hip clearance without max-totalA.  x5 scoots to complete from recliner to bed    Ambulation/Gait               General Gait Details: pt unable to stand, unable to progress gait at this time     Balance Overall balance assessment: Needs assistance Sitting-balance support: Feet supported, Bilateral upper extremity supported Sitting balance-Leahy Scale: Fair   Postural control: Posterior lean     Standing balance comment: pt unable to achieve stand                             Pertinent Vitals/Pain Pain Assessment Pain Assessment: No/denies pain Faces Pain Scale: No hurt    Home Living Family/patient expects to be discharged to:: Private residence Living Arrangements: Other relatives Available Help at Discharge: Family;Available PRN/intermittently  Type of Home: House Home Access: Ramped entrance     Alternate Level Stairs-Number of Steps: stays on the main level Home Layout: Two level;Able to live on main level with bedroom/bathroom Home Equipment: Wheelchair - power;Wheelchair - manual;BSC/3in1 Additional Comments: hoyer lift.    Prior  Function Prior Level of Function : Needs assist       Physical Assist : Mobility (physical);ADLs (physical) Mobility (physical): Transfers;Bed mobility ADLs (physical): Bathing;Dressing Mobility Comments: hoyer to WC and BSC, pt eager to return to walking but has been since 08/2021 ADLs Comments: Pt reports needing assist with ADLs.  Aide on MWF, 3 hours... looking to increase for Tu and Thur     Hand Dominance   Dominant Hand: Right    Extremity/Trunk Assessment   Upper Extremity Assessment Upper Extremity Assessment: Defer to OT evaluation RUE Deficits / Details: Prior CVA RUE Sensation: WNL RUE Coordination: decreased fine motor;decreased gross motor LUE Deficits / Details: Prior CVA LUE Sensation: WNL LUE Coordination: decreased fine motor;decreased gross motor    Lower Extremity Assessment Lower Extremity Assessment: Generalized weakness (pt able to move against gravity, but poor power and strength, unable to bear weight through BLE even with maxA)    Cervical / Trunk Assessment Cervical / Trunk Assessment: Kyphotic  Communication   Communication: No difficulties  Cognition Arousal/Alertness: Awake/alert Behavior During Therapy: WFL for tasks assessed/performed Overall Cognitive Status: Within Functional Limits for tasks assessed                                          General Comments General comments (skin integrity, edema, etc.): pt on 3L Upon my arrival maintained in 90s on 1L. desat to 86% on 1L after exertion and improved to 90s on 2L    Exercises     Assessment/Plan    PT Assessment Patient needs continued PT services  PT Problem List Decreased strength;Decreased range of motion;Decreased activity tolerance;Decreased balance;Decreased mobility;Decreased coordination;Obesity       PT Treatment Interventions DME instruction;Gait training;Stair training;Functional mobility training;Therapeutic activities;Therapeutic exercise;Balance  training;Patient/family education    PT Goals (Current goals can be found in the Care Plan section)  Acute Rehab PT Goals Patient Stated Goal: improve LE strength so she can walk PT Goal Formulation: With patient Time For Goal Achievement: 02/25/22 Potential to Achieve Goals: Fair    Frequency Min 3X/week        AM-PAC PT "6 Clicks" Mobility  Outcome Measure Help needed turning from your back to your side while in a flat bed without using bedrails?: A Lot Help needed moving from lying on your back to sitting on the side of a flat bed without using bedrails?: A Lot Help needed moving to and from a bed to a chair (including a wheelchair)?: Total Help needed standing up from a chair using your arms (e.g., wheelchair or bedside chair)?: Total Help needed to walk in hospital room?: Total Help needed climbing 3-5 steps with a railing? : Total 6 Click Score: 8    End of Session Equipment Utilized During Treatment: Gait belt;Oxygen Activity Tolerance: Patient tolerated treatment well Patient left: in bed;with call bell/phone within reach;with family/visitor present Nurse Communication: Mobility status;Need for lift equipment PT Visit Diagnosis: Unsteadiness on feet (R26.81);Other abnormalities of gait and mobility (R26.89);Muscle weakness (generalized) (M62.81)    Time: 9381-8299 PT Time Calculation (min) (ACUTE ONLY): 22 min   Charges:  PT Evaluation $PT Eval Low Complexity: 1 Low          West Carbo, PT, DPT   Acute Rehabilitation Department Pager #: [phone]  Sandra Cockayne 02/11/2022, 2:37 PM

## 2022-02-11 NOTE — Evaluation (Signed)
Clinical/Bedside Swallow Evaluation Patient Details  Name: [name] MRN: [id number] Date of Birth: 08/13/1951  Today's Date: 02/11/2022 Time: SLP Start Time (ACUTE ONLY): 1009 SLP Stop Time (ACUTE ONLY): 9381 SLP Time Calculation (min) (ACUTE ONLY): 30 min  Past Medical History:  Past Medical History:  Diagnosis Date   Abnormal echocardiogram    Anemia    Arthritis    BPPV (benign paroxysmal positional vertigo), right 06/13/2017   Bradycardia 03/07/2020   Cerebrovascular accident (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 ([place]) 10/29/2015   CHB (complete heart block) ([place]) 03/08/2020   CHF (congestive heart failure) ([place])    Closed fracture of left proximal humerus 07/08/2019   Decreased range of motion of left shoulder 09/03/2020   Depression    Dizzy 03/07/2020   bradycardia   Edentulous 09/03/2020   GERD (gastroesophageal reflux disease)    Hemiparesis affecting left side as late effect of cerebrovascular accident (CVA) ([name]) 03/08/2020   Hemorrhoids    History of Falls with injury 03/08/2020   Fall resulting in left humeral fracture   Hyperlipidemia    Hypertension    Hypothyroidism    Intracranial vascular stenosis    Lacunar infarction ([place])    Brain MRI 07/2021 multiple small remote pontine lacunar infarcts   Near syncope 03/08/2020   Paroxysmal atrial fibrillation ([facility])    Presence of permanent cardiac pacemaker    Proteinuria 03/08/2020   Seasonal allergies    Stroke ([place])    Thyroid cancer ([place])    per pt S/p Total Thyroidectomy with Radioactive Iodine Therapy   Type 1 diabetes mellitus ([place])    Past Surgical History:  Past Surgical History:  Procedure Laterality Date   ABDOMINAL ANGIOGRAM N/A 05/14/2012   Procedure: ABDOMINAL ANGIOGRAM;  Surgeon: Laverda Page, MD;  Location: [facility] CATH LAB;  Service: Cardiovascular;  Laterality: N/A;   ABDOMINAL HYSTERECTOMY  1995   partial   ANKLE FRACTURE SURGERY Right    CARDIAC CATHETERIZATION      with coronary angiogram   cataract  Bilateral 2018   COLONOSCOPY N/A 09/09/2014   Procedure: COLONOSCOPY;  Surgeon: Gatha Mayer, MD;  Location: [facility];  Service: Endoscopy;  Laterality: N/A;   ESOPHAGOGASTRODUODENOSCOPY (EGD) WITH PROPOFOL N/A 12/03/2018   Procedure: ESOPHAGOGASTRODUODENOSCOPY (EGD) WITH PROPOFOL;  Surgeon: Doran Stabler, MD;  Location: [facility];  Service: Gastroenterology;  Laterality: N/A;   LEFT AND RIGHT HEART CATHETERIZATION WITH CORONARY ANGIOGRAM N/A 05/14/2012   Procedure: LEFT AND RIGHT HEART CATHETERIZATION WITH CORONARY ANGIOGRAM;  Surgeon: Laverda Page, MD;  Location: [facility] CATH LAB;  Service: Cardiovascular;  Laterality: N/A;   LOOP RECORDER INSERTION N/A 12/23/2018   Procedure: LOOP RECORDER INSERTION;  Surgeon: Constance Haw, MD;  Location: [facility] CV LAB;  Service: Cardiovascular;  Laterality: N/A;   LOOP RECORDER REMOVAL N/A 06/25/2020   Procedure: LOOP RECORDER REMOVAL;  Surgeon: Constance Haw, MD;  Location: [facility] CV LAB;  Service: Cardiovascular;  Laterality: N/A;   MINI METER   09/08/2014   ELECTRONIC INSULIN PUMP   PACEMAKER IMPLANT N/A 06/25/2020   Procedure: PACEMAKER IMPLANT;  Surgeon: Constance Haw, MD;  Location: [facility] CV LAB;  Service: Cardiovascular;  Laterality: N/A;   TEE WITHOUT CARDIOVERSION N/A 10/16/2018   Procedure: TRANSESOPHAGEAL ECHOCARDIOGRAM (TEE);  Surgeon: Buford Dresser, MD;  Location: [facility] ENDOSCOPY;  Service: Cardiovascular;  Laterality: N/A;   THYROIDECTOMY  2006   for thyroid cancer   HPI:  Pt is a 71 y.o.  female presenting with hypoxic respiratory failure and BLE edema. CXR 02/10/22 showed no acute abnormalities. MBS (11/28/21) revealed mild oropharyngeal dysphagia marked by silent aspiration of thin liquids before the swallow and NTL when consumed via straw or with large cup sip. Bolus hold with NTL assisted in elimination of aspiration. Dys 3, NTL recommended. PMH:  GERD, HFpEF, OSA, DM1, Hypothyroidism, HTN, HLD, pAF, h/o CVA (2016 and 2019), thyroid cancer s/p thyroidectomy.    Assessment / Plan / Recommendation  Clinical Impression  Pt presents with s/sx of oropharyngeal dysphagia likely similar to findings of recent MBS (11/2021) revealing silent aspiration of thin and NTL. Oral mechanism examination revealed grossly functional labial/lingual ROM and symmetry. She reports consuming dys 3, thin liquid diet at home, refusing NTL per MBS recommendations. Note, pt denies any recent hx of PNA. Upon arrival for eval, pt with dys 3 solids in buccal cavity which she reports she has been trying to clear since am meal. She ultimately expectorated into napkin stating that without her upper dentures she has difficulty chewing. Initial sips of thin liquids via cup were without s/sx of aspiration. As trials progressed, both single and consecutive sips via cup and straw were significant for consistent overt coughing/throat clearing. x1 delayed cough noted with NTL via single cup sips, but otherwise unremarkable. Prolonged mastication observed with regular textures and she demonstrated difficulty clearing bolus from oral cavity. Recommend dys 2, NTL diet at this time with adherence to aspiration precautions. Educated pt and RN regarding results and recommendations. Will f/u for tolerance and training in strategies to improve safety with POs.  SLP Visit Diagnosis: Dysphagia, oropharyngeal phase (R13.12)    Aspiration Risk  Mild aspiration risk    Diet Recommendation Dysphagia 2 (Fine chop);Nectar-thick liquid   Liquid Administration via: No straw;Cup Medication Administration: Whole meds with puree Supervision: Patient able to self feed;Intermittent supervision to cue for compensatory strategies Compensations: Minimize environmental distractions;Slow rate;Small sips/bites;Clear throat intermittently Postural Changes: Seated upright at 90 degrees    Other  Recommendations  Oral Care Recommendations: Oral care BID    Recommendations for follow up therapy are one component of a multi-disciplinary discharge planning process, led by the attending physician.  Recommendations may be updated based on patient status, additional functional criteria and insurance authorization.  Follow up Recommendations Other (comment) (TBD)      Assistance Recommended at Discharge Intermittent Supervision/Assistance  Functional Status Assessment Patient has had a recent decline in their functional status and demonstrates the ability to make significant improvements in function in a reasonable and predictable amount of time.  Frequency and Duration min 2x/week  2 weeks       Prognosis Prognosis for Safe Diet Advancement: Fair Barriers to Reach Goals: Time post onset;Severity of deficits;Motivation      Swallow Study   General Date of Onset: 11/28/21 HPI: Pt is a 71 y.o. female presenting with hypoxic respiratory failure and BLE edema. CXR 02/10/22 showed no acute abnormalities. MBS (11/28/21) revealed mild oropharyngeal dysphagia marked by silent aspiration of thin liquids before the swallow and NTL when consumed via straw or with large cup sip. Bolus hold with NTL assisted in elimination of aspiration. Dys 3, NTL recommended. PMH: GERD, HFpEF, OSA, DM1, Hypothyroidism, HTN, HLD, pAF, h/o CVA (2016 and 2019), thyroid cancer s/p thyroidectomy. Type of Study: Bedside Swallow Evaluation Previous Swallow Assessment: see HPI Diet Prior to this Study: Dysphagia 3 (soft);Thin liquids Temperature Spikes Noted: No Respiratory Status: Nasal cannula History of Recent Intubation: No Behavior/Cognition: Alert;Cooperative;Pleasant mood  Oral Cavity Assessment: Within Functional Limits Oral Care Completed by SLP: No Oral Cavity - Dentition: Other (Comment) (edentulous, top; adequate natural dentition, bottom) Vision: Functional for self-feeding Self-Feeding Abilities: Able to feed self Patient  Positioning: Upright in bed;Postural control adequate for testing Baseline Vocal Quality: Normal Volitional Cough: Strong Volitional Swallow: Able to elicit    Oral/Motor/Sensory Function Overall Oral Motor/Sensory Function: Within functional limits Facial ROM: Within Functional Limits Facial Symmetry: Within Functional Limits Lingual ROM: Within Functional Limits Lingual Symmetry: Within Functional Limits   Ice Chips Ice chips: Within functional limits Presentation: Spoon   Thin Liquid Thin Liquid: Impaired Presentation: Cup;Straw;Self Fed Pharyngeal  Phase Impairments: Suspected delayed Swallow;Throat Clearing - Immediate;Throat Clearing - Delayed;Cough - Immediate;Cough - Delayed    Nectar Thick Nectar Thick Liquid: Impaired Presentation: Cup Pharyngeal Phase Impairments: Cough - Delayed   Honey Thick Honey Thick Liquid: Not tested   Puree Puree: Within functional limits Presentation: Spoon;Self Fed   Solid     Solid: Impaired Presentation: Self Fed Oral Phase Impairments: Impaired mastication Oral Phase Functional Implications: Impaired mastication;Oral residue     Ellwood Dense, MA, [place] Office Number: [phone]  Acie Fredrickson 02/11/2022,11:07 AM

## 2022-02-11 NOTE — Hospital Course (Addendum)
[name] is a 71 y.o. female presenting with hypoxic respiratory failure and BLE edema. PMH is significant for GERD, HFpEF, OSA, DM1, Hypothyroidism, HTN, HLD, pAF, h/o CVA (2016 and 2019), thyroid cancer s/p thyroidectomy.   Acute hypoxemic respiratory failure   HFpEF   PAH Patient admitted with respiratory distress requiring supplemental oxygen in the setting of non-compliance with Lasix. CXR with no acute findings, no concern for ACS, low PE likelihood and stable anemia. Patient appeared fluid overloaded in the ER and received IV Lasix with good urine output. Oxygen supplementation was able to be weaned and was restarted on home lasix dosing while maintaining good UOP. An echocardiogram showed pulmonary HTN (PASP 75) possibly secondary to hypoxemia from uncontrolled OSA. Pulmonology was consulted and recommended to continue with BiPAP at night. ABG was ordered and was unremarkable without evidence of hypercarbia. Patient was instructed how to use BiPAP and stressed it's importance at night in addition to home oxygen. She was sent home on 1-2 L of supplemental oxygen.  Dysuria Patient reported pain with urination over the last month. Urine culture obtained and showed 80,000 E. Coli. Treating with Keflex (2/18-2/24).   Type 1 DM Patient's home insulin pump was continued during hospitalization. A1c was 8.3. CBGs were well controlled. Insulin pump was not working at one point in hospitalization, but spot dose of insulin provided during that time.   All other chronic conditions were stable and home medications continued.   Issues for follow-up: Please ensure patient completes antibiotic course. Consider addition of victoza to diabetic regimen.  BiPAP must be used at night, sent home with oxygen as well  Home on Lasix BID, may need change in dosage outpatient Maintain oxygen 88-95% maximum with home O2  Repeat BMP and CBC outpatient  May need cardiology consult and RHC in future  with continued diuresis

## 2022-02-12 ENCOUNTER — Inpatient Hospital Stay (HOSPITAL_COMMUNITY): Payer: Medicare PPO

## 2022-02-12 DIAGNOSIS — R0609 Other forms of dyspnea: Secondary | ICD-10-CM | POA: Diagnosis not present

## 2022-02-12 DIAGNOSIS — R0602 Shortness of breath: Secondary | ICD-10-CM | POA: Diagnosis not present

## 2022-02-12 LAB — CBC
HCT: 31.2 % — ABNORMAL LOW (ref 36.0–46.0)
Hemoglobin: 10 g/dL — ABNORMAL LOW (ref 12.0–15.0)
MCH: 28.7 pg (ref 26.0–34.0)
MCHC: 32.1 g/dL (ref 30.0–36.0)
MCV: 89.7 fL (ref 80.0–100.0)
Platelets: 187 10*3/uL (ref 150–400)
RBC: 3.48 MIL/uL — ABNORMAL LOW (ref 3.87–5.11)
RDW: 14.6 % (ref 11.5–15.5)
WBC: 4.8 10*3/uL (ref 4.0–10.5)
nRBC: 0 % (ref 0.0–0.2)

## 2022-02-12 LAB — ECHOCARDIOGRAM COMPLETE
AR max vel: 2.08 cm2
AV Area VTI: 1.81 cm2
AV Area mean vel: 1.99 cm2
AV Mean grad: 7 mmHg
AV Peak grad: 12.5 mmHg
Ao pk vel: 1.77 m/s
Calc EF: 52 %
Height: 67 in
S' Lateral: 2.6 cm
Single Plane A2C EF: 55.3 %
Single Plane A4C EF: 52.2 %
Weight: 3858.93 oz

## 2022-02-12 LAB — BASIC METABOLIC PANEL
Anion gap: 9 (ref 5–15)
BUN: 15 mg/dL (ref 8–23)
CO2: 27 mmol/L (ref 22–32)
Calcium: 8.1 mg/dL — ABNORMAL LOW (ref 8.9–10.3)
Chloride: 104 mmol/L (ref 98–111)
Creatinine, Ser: 1.06 mg/dL — ABNORMAL HIGH (ref 0.44–1.00)
GFR, Estimated: 56 mL/min — ABNORMAL LOW (ref >60)
Glucose, Bld: 109 mg/dL — ABNORMAL HIGH (ref 70–99)
Potassium: 3.2 mmol/L — ABNORMAL LOW (ref 3.5–5.1)
Sodium: 140 mmol/L (ref 135–145)

## 2022-02-12 LAB — GLUCOSE, CAPILLARY
Glucose-Capillary: 112 mg/dL — ABNORMAL HIGH (ref 70–99)
Glucose-Capillary: 130 mg/dL — ABNORMAL HIGH (ref 70–99)
Glucose-Capillary: 168 mg/dL — ABNORMAL HIGH (ref 70–99)
Glucose-Capillary: 262 mg/dL — ABNORMAL HIGH (ref 70–99)
Glucose-Capillary: 98 mg/dL (ref 70–99)
Glucose-Capillary: 99 mg/dL (ref 70–99)

## 2022-02-12 MED ORDER — POTASSIUM CHLORIDE CRYS ER 20 MEQ PO TBCR
40.0000 meq | EXTENDED_RELEASE_TABLET | Freq: Once | ORAL | Status: AC
  Administered 2022-02-12: 40 meq via ORAL
  Filled 2022-02-12: qty 2

## 2022-02-12 MED ORDER — FUROSEMIDE 40 MG PO TABS
40.0000 mg | ORAL_TABLET | Freq: Two times a day (BID) | ORAL | Status: DC
  Administered 2022-02-12 – 2022-02-15 (×7): 40 mg via ORAL
  Filled 2022-02-12 (×7): qty 1

## 2022-02-12 NOTE — Progress Notes (Signed)
FPTS Brief Progress Note  S: Patient watching television.  States no concerns at this time.  Denies any chest pain, shortness of breath.  States pulse ox had fallen off and she cannot get it to stay back on.   O: BP (!) 139/54 (BP Location: Right Arm)    Pulse (!) 50    Temp 98.5 F (36.9 C) (Oral)    Resp 15    Ht 5\' 7"  (1.702 m)    Wt 109.4 kg    SpO2 95%    BMI 37.77 kg/m   General: Patient lying in bed, NAD Cardio: Bradycardic into the 50s Respiratory: Patient breathing comfortably on 2 L  A/P: Acute Hypoxic Respiratory Failure   HFpEF Pt on 2  L via [place].  Cannot assess oxygen saturation due to pulse ox not staying on the patient appears to be breathing very comfortably.  Called nurse to come and replace it.   Hypokalemia -Will check am BMP and replete prn   -Continue to follow plan outlined in day teams progress note Precious Gilding, DO 02/12/2022, 9:14 PM PGY-1, [facility] Family Medicine Night Resident  Please page [phone] with questions.

## 2022-02-12 NOTE — Progress Notes (Signed)
°  Echocardiogram 2D Echocardiogram has been performed.  Fidel Levy 02/12/2022, 9:49 AM

## 2022-02-12 NOTE — Plan of Care (Signed)

## 2022-02-12 NOTE — Progress Notes (Addendum)
Family Medicine Teaching Service Daily Progress Note Intern Pager: [phone]  Patient name: [name] Medical record number: [id number] Date of birth: 1951/11/06 Age: 71 y.o. Gender: female  Primary Care Provider: Eulis Foster, MD Consultants: None Code Status: Full  Pt Overview and Major Events to Date:  2/17- Admitted  Assessment and Plan: [name] is a 71 y.o. female presenting with hypoxic respiratory failure and BLE edema. PMH is significant for GERD, HFpEF, OSA, DM1, Hypothyroidism, HTN, HLD, pAF, h/o CVA (2016 and 2019), thyroid cancer s/p thyroidectomy.    Acute hypoxemic respiratory failure   HFpEF Overnight wean O2 from 3L to 1L. Wt. Down 2kg. 1.1 L output s/p PO 40 mg lasix. Redose 40 mg of oral overnight. This morning desatted to 87% on RA, now back up to 1L.   - Follow-up echo - Restart home lasix dosing 40 mg BID - Supplemental O2 to maintain sats > 90%; wean to room air - Daily weights - Strict I's and O's  Hypokalemia K 3.2 -Replete PRN   Dysuria Continues to report dysuria. UA with large leuks, negative nitrites. Afebrile, no white count. - UCx pending - Continue keflex 500 mg TID for 7 days  T1DM Glu 109 - Continue insulin pump - CBG every 4 hours   HTN BP 129/49.  - Continue home amlodipine 2.5 mg daily - Continue home metoprolol 30 mg daily - Watch soft pressures with continue diuresis  GERD - Continue home Protonix twice daily  Hypothyroidism s/p thyroidectomy TSH appropriate at 3.228 - Continue home levothyroxine 130mcg   H/o CVA   chronic bradycardia s/p pacemaker   PAF - Continue home metoprolol (as long as HR can tolerate) - Eliquis 5 mg twice daily - Continue home Lipitor 80 mg   OSA - CPAP nightly   FEN/GI: Dysphagia 3 PPx: Home Eliquis 5 mg twice daily Dispo:Pending clinical improvement with respiratory status.   Subjective:  Doing well. Denies feeling short of breath.    Objective: Temp:  [98 F (36.7 C)-99.1 F (37.3 C)] 98.9 F (37.2 C) (02/19 0429) Pulse Rate:  [50-57] 50 (02/19 0429) Resp:  [15-22] 17 (02/19 0429) BP: (107-147)/(44-71) 129/49 (02/19 0429) SpO2:  [94 %-100 %] 98 % (02/19 0429) Weight:  [109.4 kg] 109.4 kg (02/19 0429) Physical Exam: General: Initially sleeping, no acute distress. Age appropriate. Cardiac: RRR, normal heart sounds, no murmurs Respiratory: Improving coarse breath sounds, normal effort Extremities: No LE edema or cyanosis. Skin: Warm and dry, no rashes noted Neuro: alert and oriented Psych: normal affect  Laboratory: Recent Labs  Lab 02/10/22 1212 02/11/22 0032 02/12/22 0510  WBC 5.0 6.1 4.8  HGB 10.6* 10.1* 10.0*  HCT 34.4* 33.0* 31.2*  PLT 184 185 187   Recent Labs  Lab 02/10/22 1212 02/11/22 0032  NA 140 141  K 3.8 3.6  CL 104 106  CO2 24 25  BUN 15 15  CREATININE 1.10* 1.11*  CALCIUM 8.8* 8.7*  PROT 6.9 6.8  BILITOT 1.8* 1.2  ALKPHOS 76 72  ALT 12 10  AST 20 15  GLUCOSE 95 106*    Imaging/Diagnostic Tests: No new imaging  Gerlene Fee, DO 02/12/2022, 6:04 AM PGY-3, [place] Intern pager: [phone], text pages welcome

## 2022-02-13 ENCOUNTER — Other Ambulatory Visit (HOSPITAL_COMMUNITY): Payer: Self-pay

## 2022-02-13 DIAGNOSIS — R0602 Shortness of breath: Secondary | ICD-10-CM | POA: Diagnosis not present

## 2022-02-13 LAB — BASIC METABOLIC PANEL
Anion gap: 10 (ref 5–15)
BUN: 16 mg/dL (ref 8–23)
CO2: 28 mmol/L (ref 22–32)
Calcium: 8.6 mg/dL — ABNORMAL LOW (ref 8.9–10.3)
Chloride: 101 mmol/L (ref 98–111)
Creatinine, Ser: 1.11 mg/dL — ABNORMAL HIGH (ref 0.44–1.00)
GFR, Estimated: 53 mL/min — ABNORMAL LOW (ref >60)
Glucose, Bld: 203 mg/dL — ABNORMAL HIGH (ref 70–99)
Potassium: 3.7 mmol/L (ref 3.5–5.1)
Sodium: 139 mmol/L (ref 135–145)

## 2022-02-13 LAB — GLUCOSE, CAPILLARY
Glucose-Capillary: 129 mg/dL — ABNORMAL HIGH (ref 70–99)
Glucose-Capillary: 146 mg/dL — ABNORMAL HIGH (ref 70–99)
Glucose-Capillary: 160 mg/dL — ABNORMAL HIGH (ref 70–99)
Glucose-Capillary: 162 mg/dL — ABNORMAL HIGH (ref 70–99)
Glucose-Capillary: 176 mg/dL — ABNORMAL HIGH (ref 70–99)
Glucose-Capillary: 195 mg/dL — ABNORMAL HIGH (ref 70–99)
Glucose-Capillary: 195 mg/dL — ABNORMAL HIGH (ref 70–99)
Glucose-Capillary: 195 mg/dL — ABNORMAL HIGH (ref 70–99)

## 2022-02-13 LAB — CBC
HCT: 32.9 % — ABNORMAL LOW (ref 36.0–46.0)
Hemoglobin: 10.6 g/dL — ABNORMAL LOW (ref 12.0–15.0)
MCH: 28.6 pg (ref 26.0–34.0)
MCHC: 32.2 g/dL (ref 30.0–36.0)
MCV: 88.9 fL (ref 80.0–100.0)
Platelets: 214 10*3/uL (ref 150–400)
RBC: 3.7 MIL/uL — ABNORMAL LOW (ref 3.87–5.11)
RDW: 14.6 % (ref 11.5–15.5)
WBC: 5.8 10*3/uL (ref 4.0–10.5)
nRBC: 0 % (ref 0.0–0.2)

## 2022-02-13 LAB — URINE CULTURE: Culture: 80000 — AB

## 2022-02-13 NOTE — Progress Notes (Signed)
Patient states she is not ready for CPAP at this time.

## 2022-02-13 NOTE — Plan of Care (Signed)

## 2022-02-13 NOTE — Progress Notes (Addendum)
Family Medicine Teaching Service Daily Progress Note Intern Pager: [phone]  Patient name: [name] Medical record number: [id number] Date of birth: [birth year] Age: 71 y.o. Gender: female  Primary Care Provider: Eulis Foster, MD Consultants: None  Code Status: FULL  Pt Overview and Major Events to Date:  2/17- Admitted  Assessment and Plan:  [name] is a 71 y.o. female presenting with hypoxic respiratory failure and BLE edema. PMH is significant for GERD, HFpEF, OSA, DM1, Hypothyroidism, HTN, HLD, pAF, h/o CVA (2016 and 2019), thyroid cancer s/p thyroidectomy.   Acute Hypoxemic Respiratory Failure   HFpEF   Pulmonary HTN  Oxygen requirement of 2L this AM>weaned to 1L. 109.4 kg>110.9 kg today. Creatinine 1.06>1.11. 1L UOP over last 24 hours. Since admission, down 2.5L. On echo, EF 60-65%, IVC poorly visualized, severe pulmonary hypertension (PASP 75), mild to moderate tricuspid regurgitation, moderate calcification of aortic valve. Will consult pulmonology for pulmonary HTN recommendations.  -Home Lasix dosing 40 mg BID  -Follow up with pulmonology for recs  -Supplemental O2 to maintain saturations >88% to 95%, wean to RA -Daily weights  -Strict I/Os  -PT/OT eval and treat (HHPT on d/c) -Per pulm, CPAP>BiPAP -May need autoimmune work up>awaiting further pulm recs   Hypokalemia  K 3.7 this AM. Replete as needed.   Dysuria  Ucx: 80,000 E. Coli. Treating with Keflex (2/18-2/24).  -Monitor symptom improvement -Continue Keflex as above   DM1 Glucose this AM 162, with Medtronix insulin pump  -Continue with insulin pump  -CBG q4hr   HTN 150/48, watch soft diastolics with diuresis  -Continue home Metoprolol and Amlodipine   GERD  -Continue home Protonix   Hypothyroidism s/p thyroidectomy  TSH 3.228 -Continue home Levothyroxine   H/o CVA   Chronic bradycardia s/p pacemaker   pAF  -Continue home Metoprolol (brady to 50s  today) -Eliquis 5mg  2xdaily  -Continue home lipitor   OSA  -CPAP at night   FEN/GI: DYS3 PPx: Eliquis 5 mg twice daily  Dispo: Pending pulmonology recs and amb with pulse ox   Subjective:  Patient is doing well this AM, no complaints other than about a staff member she does not want to have taking care of her today. She is eating well.   Objective: Temp:  [97.7 F (36.5 C)-99.6 F (37.6 C)] 97.9 F (36.6 C) (02/20 1116) Pulse Rate:  [50-57] 56 (02/20 1116) Resp:  [15-24] 24 (02/20 1116) BP: (121-150)/(38-54) 129/49 (02/20 1116) SpO2:  [92 %-100 %] 100 % (02/20 0356) Weight:  [110.9 kg] 110.9 kg (02/20 0006) General: Alert and oriented in no apparent distress, pleasant elderly patient  Heart: Regular rate and rhythm with no murmurs appreciated Lungs: CTA bilaterally, no wheezing, no crackles or stridor  Abdomen: Bowel sounds present, no abdominal pain Skin: Warm and dry Extremities: Minimal pitting edema with palpable DP pulses    Laboratory: Recent Labs  Lab 02/11/22 0032 02/12/22 0510 02/13/22 0336  WBC 6.1 4.8 5.8  HGB 10.1* 10.0* 10.6*  HCT 33.0* 31.2* 32.9*  PLT 185 187 214   Recent Labs  Lab 02/10/22 1212 02/11/22 0032 02/12/22 0510 02/13/22 0336  NA 140 141 140 139  K 3.8 3.6 3.2* 3.7  CL 104 106 104 101  CO2 24 25 27 28   BUN 15 15 15 16   CREATININE 1.10* 1.11* 1.06* 1.11*  CALCIUM 8.8* 8.7* 8.1* 8.6*  PROT 6.9 6.8  --   --   BILITOT 1.8* 1.2  --   --  ALKPHOS 76 72  --   --   ALT 12 10  --   --   AST 20 15  --   --   GLUCOSE 95 106* 109* 203*   No results found.   Erskine Emery, MD 02/13/2022, 11:52 AM PGY-1, [place] Intern pager: [phone], text pages welcome

## 2022-02-13 NOTE — TOC Initial Note (Addendum)
Transition of Care [facility]) - Initial/Assessment Note    Patient Details  Name: [name] MRN: [id number] Date of Birth: 1951/05/06  Transition of [facility]) CM/SW Contact:    Zenon Mayo, RN Phone Number: 02/13/2022, 1:41 PM  Clinical Narrative:                 Patient is from home with her sister, she has a walker, w/chair, lift, she states she has an aide with [facility] on MWF from 11 to 2 pm. NCM offered choice, she states she would like [facility].  NCM made referral to [facility] with [facility], he is able to take referral.  Soc will begin 24 to 48 hrs post dc.  She states her niece or her sister will  transport her home at discharge.     Expected Discharge Plan: [place] Barriers to Discharge: Continued Medical Work up   Patient Goals and CMS Choice Patient states their goals for this hospitalization and ongoing recovery are:: return home CMS Medicare.gov Compare Post Acute Care list provided to:: Patient Choice offered to / list presented to : Patient  Expected Discharge Plan and Services Expected Discharge Plan: [place]   Discharge Planning Services: CM Consult Post Acute Care Choice: [place] arrangements for the past 2 months: Single Family Home                   DME Agency: NA       HH Arranged: RN, PT, Speech Therapy HH Agency: [facility] ([place]) Date HH Agency Contacted: 02/13/22 Time [place]: [facility] Representative spoke with at [place]: Corene Cornea  Prior Living Arrangements/Services Living arrangements for the past 2 months: [place] Lives with:: Siblings Patient language and need for interpreter reviewed:: Yes        Need for Family Participation in Patient Care: Yes (Comment) Care giver support system in place?: Yes (comment) Current home services: DME (has a [employer], w/chair, lift,) Criminal Activity/Legal Involvement Pertinent to Current Situation/Hospitalization: No  - Comment as needed  Activities of Daily Living Home Assistive Devices/Equipment: Wheelchair, [employer] fast streamer, Insulin Pump, CPAP ADL Screening (condition at time of admission) Patient's cognitive ability adequate to safely complete daily activities?: No Is the patient deaf or have difficulty hearing?: No Does the patient have difficulty seeing, even when wearing glasses/contacts?: No Does the patient have difficulty concentrating, remembering, or making decisions?: No Patient able to express need for assistance with ADLs?: Yes Does the patient have difficulty dressing or bathing?: Yes Independently performs ADLs?: No Communication: Independent Grooming: Needs assistance Is this a change from baseline?: Pre-admission baseline Feeding: Independent Bathing: Needs assistance Is this a change from baseline?: Pre-admission baseline Toileting: Independent with device (comment) Is this a change from baseline?: Pre-admission baseline In/Out Bed: Independent with device (comment) Walks in Home: Independent with device (comment) Does the patient have difficulty walking or climbing stairs?: Yes Weakness of Legs: Both Weakness of Arms/Hands: None  Permission Sought/Granted                  Emotional Assessment Appearance:: Appears stated age Attitude/Demeanor/Rapport: Engaged Affect (typically observed): Appropriate Orientation: : Oriented to Self, Oriented to Place, Oriented to  Time, Oriented to Situation Alcohol / Substance Use: Not Applicable Psych Involvement: No (comment)  Admission diagnosis:  Shortness of breath [R06.02] SOB (shortness of breath) [R06.02] Hypoxia [R09.02] Patient Active Problem List   Diagnosis Date Noted   SOB (shortness  of breath) 02/10/2022   Suicidal ideation 11/16/2021   Hypoxia, sleep related 11/16/2021   At risk for adverse drug reaction 10/13/2021   Leukopenia    Diabetic peripheral neuropathy [facility])    Essential hypertension    Left pontine  cerebrovascular accident ([place]) 08/10/2021   Chronic diastolic congestive heart failure ([place])    Dyspnea    Weakness    Left pontine stroke ([place]) 08/06/2021   BMI 40.0-44.9, adult ([place]) 08/06/2021   Weakness of left side of body 08/04/2021   Wound of left foot 07/26/2021   Unable to perform bed mobility without assistance 07/26/2021   Pressure ulcer 06/29/2021   Stiffness due to immobility 05/18/2021   Healthcare maintenance 02/17/2021   Mild cognitive impairment 09/03/2020   Presbyopia 09/03/2020   Counseling regarding advanced directives 09/03/2020   Constipation 09/03/2020   Imbalance 09/03/2020   Decreased range of motion of left shoulder 09/03/2020   Chronic lower back pain 07/27/2020   S/P placement of cardiac pacemaker 06/25/20  06/26/2020   Chronic DOAC therapy 03/08/2020   Hemiparesis and numbness affecting left side as late effect of cerebrovascular accident (CVA) ([place]) 03/08/2020   History of Falls with injury 03/08/2020   Hypoalbuminemia 03/08/2020   Secondary hypercoagulable state ([place]) 11/12/2019   Dependent for wheelchair mobility 08/05/2019   OSA (obstructive sleep apnea) 05/27/2019   Paroxysmal atrial fibrillation ([place]) 05/27/2019   Insulin pump in place    (HFpEF) heart failure with preserved ejection fraction ([place]) 03/17/2019   Mood disorder ([place]) 10/29/2018   Hypertension associated with diabetes ([place]) 10/29/2015   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 ([place])    Lower extremity edema 08/20/2013   Morbid obesity ([place]) 05/29/2013   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 ([place]) 16/09/9603   Hypothyroidism, 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   GERD (gastroesophageal reflux disease) 01/25/2012   PCP:  Eulis Foster, MD Pharmacy:   [facility] [place], [place] AT [address] [place] [address] Phone: [phone] Fax:  [phone]  [id number] - [place], [place] - [address] AT [address] [place] [address] Phone: [phone] Fax: [phone]     Social Determinants of Health (SDOH) Interventions    Readmission Risk Interventions Readmission Risk Prevention Plan 02/13/2022  Transportation Screening Complete  PCP or Specialist Appt within 3-5 Days Complete  HRI or Home Care Consult Complete  Social Work Consult for [place] Planning/Counseling Complete  Palliative Care Screening Not Applicable  Medication Review Press photographer) Complete  Some recent data might be hidden

## 2022-02-13 NOTE — Progress Notes (Signed)
Pt does wear dentures BUT her dentures are with her SISTER at [place] she states.

## 2022-02-13 NOTE — Progress Notes (Addendum)
Speech Language Pathology Treatment: Dysphagia  Patient Details Name: [name] MRN: [id number] DOB: [birth year] Today's Date: 02/13/2022 Time: 1791-5056 SLP Time Calculation (min) (ACUTE ONLY): 30 min  Assessment / Plan / Recommendation Clinical Impression  F/u after bedside swallow evaluation on 2/18. Pt politely and strongly declined further thickened liquids.  We discussed her hx of aspiration, and also the fact that aspiration has not led to adverse consequences like pna. She has been drinking thin liquids at home.  We reviewed ways to help protect her airway, including using the brief oral hold before swallowing (demonstrated to be beneficial per Dec MBS), avoiding straws, and drinking cold liquids to potentially help her sensory response (pt observed anecdotally that she does not cough as often with colder liquids). She was offered several packs of thickener to use on days when she is coughing more frequently. She appreciated the opportunity to make that decision herself. [name] prefers to stay on dysphagia 2 solids.   Recommend dysphagia 2, thin liquids; thicken to nectar at pt's discretion.  Staff should not be alarmed if she coughs intermittently with meals.  SLP will follow while she remains admitted.   HPI HPI: Pt is a 71 y.o. female presenting with hypoxic respiratory failure and BLE edema. CXR 02/10/22 showed no acute abnormalities. MBS (11/28/21) revealed mild oropharyngeal dysphagia marked by silent aspiration of thin liquids before the swallow and NTL when consumed via straw or with large cup sip. Bolus hold with NTL assisted in elimination of aspiration. Dys 3, NTL recommended. PMH: GERD, HFpEF, OSA, DM1, Hypothyroidism, HTN, HLD, pAF, h/o CVA (2016 and 2019), thyroid cancer s/p thyroidectomy.      SLP Plan  Continue with current plan of care      Recommendations for follow up therapy are one component of a multi-disciplinary discharge planning process, led  by the attending physician.  Recommendations may be updated based on patient status, additional functional criteria and insurance authorization.    Recommendations  Diet recommendations: Dysphagia 2 (fine chop);Thin liquid Liquids provided via: Cup Medication Administration: Whole meds with puree Supervision: Patient able to self feed Compensations: Other (Comment) (hold liquids in mouth momentarily before swallowing) Postural Changes and/or Swallow Maneuvers: Seated upright 90 degrees                Oral Care Recommendations: Oral care BID Follow Up Recommendations: No SLP follow up Assistance recommended at discharge: Intermittent Supervision/Assistance SLP Visit Diagnosis: Dysphagia, oropharyngeal phase (R13.12) Plan: Continue with current plan of care        [name] L. Tivis [name], [place] CCC/SLP Acute Rehabilitation Services Office number [phone] Pager [phone]   [name]  02/13/2022, 11:49 AM

## 2022-02-13 NOTE — Progress Notes (Signed)
Pt has had two strokes in the past she states and is bedridden. She has a pressure ulcer at this time on her right  heel. PRAFO boots are being  placed on patient when in bed.

## 2022-02-13 NOTE — Progress Notes (Signed)
Inpatient Diabetes Program Recommendations  AACE/ADA: New Consensus Statement on Inpatient Glycemic Control (2015)  Target Ranges:  Prepandial:   less than 140 mg/dL      Peak postprandial:   less than 180 mg/dL (1-2 hours)      Critically ill patients:  140 - 180 mg/dL   Lab Results  Component Value Date   GLUCAP 146 (H) 02/13/2022   HGBA1C 8.3 (H) 02/11/2022    Review of Glycemic Control  Latest Reference Range & Units 02/13/22 00:03 02/13/22 02:05 02/13/22 04:11 02/13/22 07:31 02/13/22 11:15  Glucose-Capillary 70 - 99 mg/dL 195 (H) 160 (H) 195 (H) 162 (H) 146 (H)   Diabetes history: DM 1 Outpatient Diabetes medications:   Insulin pump:  12a-3a- 0.5 units/hr   3a-9a-0.75 units/ hr   9a-12p-1.3 units/ hr 12p-1:30p- 0.95 units/hr 1:30p-3p- 1.25 units/ hr  3p-6p- 1.15 units/hr 6p-7:30 p- 1.15 units/ hr 7:30p-MN- 0.95 units/ hr  Total basal: 22.5 units/ 24 hours Goal blood sugar=140 mg/dL  1 unit drops blood sugar 50-60 mg/dL CHO coverage: 12a-10a- 4.1 grams of CHO 10a-9:30p- 4.0 grams of CHO 9:30p-12MN-6.0 grams of CHO  Current orders for Inpatient glycemic control:  Insulin pump  Inpatient Diabetes Program Recommendations:    Spoke with patient regarding DM management.  She wears a Medtronic insulin pump and also a Dexcom G6 CGM.  Blood sugars are within hospital goal.  She states she will change site tomorrow and she has her sister bringing supplies.   Will follow.   Thanks,  Adah Perl, RN, BC-ADM Inpatient Diabetes Coordinator Pager [phone]  (8a-5p)

## 2022-02-13 NOTE — TOC Benefit Eligibility Note (Signed)
Patient Teacher, English as a foreign language completed.    The patient is currently admitted and upon discharge could be taking Ozempic Pens.  The current 30 day co-pay is, $40.00.   The patient is currently admitted and upon discharge could be taking Victoza Pens.  The current 30 day co-pay is, $40.00.  The patient is currently admitted and upon discharge could be taking Trulicity Pens.  The current 30 day co-pay is, $40.00.  The patient is insured through [place] Patient Advocate Specialist [place] Patient Advocate Team Direct Number: [phone]  Fax: [phone]

## 2022-02-13 NOTE — Consult Note (Signed)
NAME:  [name], MRN:  [id number], DOB:  [birth year], LOS: 3 ADMISSION DATE:  02/10/2022, CONSULTATION DATE:  2/20 REFERRING MD:  Dr. Wyline Mood, CHIEF COMPLAINT:  Pulmonary hypertension   History of Present Illness:  71 year old female with PMH as below, which is significant for CVA, wheelchair bound, AF on Eliquis, OSA on CPAP, CHB s/p pacemaker, DM1 on pump, HTN, hypothyroid, and thyroid cancer. She presented to [facility] ED 2/17 with complaints of lower extremity edema and dyspnea. Complained also of occasionally productive cough for yellow sputum. Had not been compliant with lasix. O2 sats as low as 79% in the ED. She was felt to be volume overloaded and was admitted for diuresis. She diureised 2.5L since admission and continued to require oxygen. Echocardiogram was done, and demonstrated significatn elevation in pulmonary artery pressures without evidence of systolic dysfunction. PCCM was asked to evaluate.   The patient tells me she has been having lower extremity edema and dyspnea off and on for a period of months. Occasionally productive cough. Never bloody. Denied fevers/chills/sick contacts. No chest pain. Does not describe orthopnea or PND. She has stopped using her CPAP for the past 6 months at least because it makes her mouth dry, and has stopped seeing her sleep specialist at the neurology office because "they just tell her to use the CPAP" and she is done with it. She feels her lower extremity edema and breathing have improved to near baseline. It is not unusual for her to get winded with conversation.   Pertinent  Medical History   has a past medical history of Abnormal echocardiogram, Anemia, Arthritis, BPPV (benign paroxysmal positional vertigo), right (06/13/2017), Bradycardia (03/07/2020), Cerebrovascular accident (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 ([place]) (10/29/2015), CHB (complete heart block) ([place]) (03/08/2020), CHF (congestive heart failure) ([place]),  Closed fracture of left proximal humerus (07/08/2019), Decreased range of motion of left shoulder (09/03/2020), Depression, Dizzy (03/07/2020), Edentulous (09/03/2020), GERD (gastroesophageal reflux disease), Hemiparesis affecting left side as late effect of cerebrovascular accident (CVA) ([place]) (03/08/2020), Hemorrhoids, History of Falls with injury (03/08/2020), Hyperlipidemia, Hypertension, Hypothyroidism, Intracranial vascular stenosis, Lacunar infarction ([place]), Near syncope (03/08/2020), Paroxysmal atrial fibrillation (HCC), Presence of permanent cardiac pacemaker, Proteinuria (03/08/2020), Seasonal allergies, Stroke ([place]), Thyroid cancer ([place]), and Type 1 diabetes mellitus ([place]).   Significant Hospital Events: Including procedures, antibiotic start and stop dates in addition to other pertinent events   2/17 admit for diuresis 2/19 echo: PASP estimated at leats 29mmHg.  LVEF 60-65%. No evidence of significant mitral or aortic valve disease. RV not well visualized. Mild to mod TR.   Interim History / Subjective:   Objective   Blood pressure (!) 150/48, pulse (!) 57, temperature 99.6 F (37.6 C), temperature source Oral, resp. rate 16, height 5\' 7"  (1.702 m), weight 110.9 kg, SpO2 100 %.        Intake/Output Summary (Last 24 hours) at 02/13/2022 0856 Last data filed at 02/13/2022 0007 Gross per 24 hour  Intake 185 ml  Output 1050 ml  Net -865 ml   Filed Weights   02/11/22 0533 02/12/22 0429 02/13/22 0006  Weight: 112.2 kg 109.4 kg 110.9 kg    Examination: General: Obese elderly female in NAD HENT: [place]/AT, PERRL, no JVD. Edentulous.  Lungs: Coarse crackles throughout the posterior lung fields.  Cardiovascular: paced rhythm at 50bmp. No MRG. No peripheral edema.  Abdomen: Soft, non-tender, non-distended Extremities: No acute deformity Neuro: Alert, oriented, nonfocal.   Resolved Hospital Problem list  Assessment & Plan:   Pulmonary hypertension: PASP on echocardiogram  estimated to be at least 75 mmHg. She does seem to be volume overloaded on exam. Echocardiogram does not describe left sided heart pathology. She is anticoagulated for AF reducing likelihood of chronic thromboembolic disease. Would be a late presentation of autoimmune disease. No known lung parenchymal disease.   Most likely etiology is severe obstructive sleep apnea. Sleep study in 2020 with AHI of 78/hr. Failed CPAP titration study later that year and was recommended BiPAP 22/16, which was later increased to 22/18. She is followed in the neurology office for this. Sounds like she was initially using her BiPAP, however it is unclear when she stopped. It has been at least 6 months based on patient reporting and EMR. Seems like compliance was spotty at best prior to stopping entirely. Stopped because it "dried her out"  Recommendations: -Change nocturnal CPAP to BiPAP 22/18 if she will tolerate -Cannot stress the importance of BiPAP compliance enough -Supplemental Oxygen as needed to keep O2 sats in the 88-95% range. Avoid sats greater than 95%. I attempted to wean from 2L to room air, but she did desaturate into the mid 80s with conversation.  -Agree with ongoing diuresis. Still has pulmonary edema by exam.  -Will discuss with attending re: autoimmune workup and VQ scan.  -Will need goals of care discussions if she is unable/unwilling to wear BiPAP.     Best Practice (right click and "Reselect all SmartList Selections" daily)   Per FMTS  Labs   CBC: Recent Labs  Lab 02/10/22 1212 02/11/22 0032 02/12/22 0510 02/13/22 0336  WBC 5.0 6.1 4.8 5.8  NEUTROABS 3.3  --   --   --   HGB 10.6* 10.1* 10.0* 10.6*  HCT 34.4* 33.0* 31.2* 32.9*  MCV 91.5 91.2 89.7 88.9  PLT 184 185 187 119    Basic Metabolic Panel: Recent Labs  Lab 02/10/22 1212 02/11/22 0032 02/12/22 0510 02/13/22 0336  NA 140 141 140 139  K 3.8 3.6 3.2* 3.7  CL 104 106 104 101  CO2 24 25 27 28   GLUCOSE 95 106* 109* 203*   BUN 15 15 15 16   CREATININE 1.10* 1.11* 1.06* 1.11*  CALCIUM 8.8* 8.7* 8.1* 8.6*  MG  --  1.7  --   --    GFR: Estimated Creatinine Clearance: 59.7 mL/min (A) (by C-G formula based on SCr of 1.11 mg/dL (H)). Recent Labs  Lab 02/10/22 1212 02/11/22 0032 02/12/22 0510 02/13/22 0336  WBC 5.0 6.1 4.8 5.8    Liver Function Tests: Recent Labs  Lab 02/10/22 1212 02/11/22 0032  AST 20 15  ALT 12 10  ALKPHOS 76 72  BILITOT 1.8* 1.2  PROT 6.9 6.8  ALBUMIN 3.0* 2.8*   No results for input(s): LIPASE, AMYLASE in the last 168 hours. No results for input(s): AMMONIA in the last 168 hours.  ABG    Component Value Date/Time   PHART 7.432 08/05/2021 0042   PCO2ART 38.2 08/05/2021 0042   PO2ART 156 (H) 08/05/2021 0042   HCO3 25.0 08/05/2021 0042   TCO2 16 (L) 04/15/2019 1333   ACIDBASEDEF 10.0 (H) 04/15/2019 1333   O2SAT 99.1 08/05/2021 0042     Coagulation Profile: No results for input(s): INR, PROTIME in the last 168 hours.  Cardiac Enzymes: No results for input(s): CKTOTAL, CKMB, CKMBINDEX, TROPONINI in the last 168 hours.  HbA1C: HbA1c, POC (controlled diabetic range)  Date/Time Value Ref Range Status  11/16/2021 02:55 PM 8.5 (A)  0.0 - 7.0 % Final  06/06/2021 10:40 AM 10.2 (A) 0.0 - 7.0 % Final   Hgb A1c MFr Bld  Date/Time Value Ref Range Status  02/11/2022 12:32 AM 8.3 (H) 4.8 - 5.6 % Final    Comment:    (NOTE) Pre diabetes:          5.7%-6.4%  Diabetes:              >6.4%  Glycemic control for   <7.0% adults with diabetes   08/04/2021 11:03 PM 10.3 (H) 4.8 - 5.6 % Final    Comment:    (NOTE) Pre diabetes:          5.7%-6.4%  Diabetes:              >6.4%  Glycemic control for   <7.0% adults with diabetes     CBG: Recent Labs  Lab 02/12/22 2003 02/13/22 0003 02/13/22 0205 02/13/22 0411 02/13/22 0731  GLUCAP 98 195* 160* 195* 162*    Review of Systems:   As above  Past Medical History:  She,  has a past medical history of Abnormal  echocardiogram, Anemia, Arthritis, BPPV (benign paroxysmal positional vertigo), right (06/13/2017), Bradycardia (03/07/2020), Cerebrovascular accident (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 ([place]) (10/29/2015), CHB (complete heart block) ([place]) (03/08/2020), CHF (congestive heart failure) ([place]), Closed fracture of left proximal humerus (07/08/2019), Decreased range of motion of left shoulder (09/03/2020), Depression, Dizzy (03/07/2020), Edentulous (09/03/2020), GERD (gastroesophageal reflux disease), Hemiparesis affecting left side as late effect of cerebrovascular accident (CVA) ([place]) (03/08/2020), Hemorrhoids, History of Falls with injury (03/08/2020), Hyperlipidemia, Hypertension, Hypothyroidism, Intracranial vascular stenosis, Lacunar infarction ([place]), Near syncope (03/08/2020), Paroxysmal atrial fibrillation (HCC), Presence of permanent cardiac pacemaker, Proteinuria (03/08/2020), Seasonal allergies, Stroke ([place]), Thyroid cancer ([place]), and Type 1 diabetes mellitus ([place]).   Surgical History:   Past Surgical History:  Procedure Laterality Date   ABDOMINAL ANGIOGRAM N/A 05/14/2012   Procedure: ABDOMINAL ANGIOGRAM;  Surgeon: Laverda Page, MD;  Location[facility] C[facility] LAB;  Service: Cardiovascular;  Laterality: N/A;   ABDOMINAL HYSTERECTOMY  1995   partial   ANKLE FRACTURE SURGERY Right    CARDIAC CATHETERIZATION     with coronary angiogram   cataract  Bilateral 2018   COLONOSCOPY N/A 09/09/2014   Procedure: COLONOSCOPY;  Surgeon: Gatha Mayer, MD;  Location: [place];  Service: Endoscopy;  Laterality: N/A;   ESOPHAGOGASTRODUODENOSCOPY (EGD) WITH PROPOFOL N/A 12/03/2018   Procedure: ESOPHAGOGASTRODUODENOSCOPY (EGD) WITH PROPOFOL;  Surgeon: Doran Stabler, MD;  Location: [place];  Service: Gastroenterology;  Laterality: N/A;   LEFT AND RIGHT HEART CATHETERIZATION WITH CORONARY ANGIOGRAM N/A 05/14/2012   Procedure: LEFT AND RIGHT HEART CATHETERIZATION WITH CORONARY ANGIOGRAM;   Surgeon: Laverda Page, MD;  Location: [facility] CATH LAB;  Service: Cardiovascular;  Laterality: N/A;   LOOP RECORDER INSERTION N/A 12/23/2018   Procedure: LOOP RECORDER INSERTION;  Surgeon: Constance Haw, MD;  Location: [place] CV LAB;  Service: Cardiovascular;  Laterality: N/A;   LOOP RECORDER REMOVAL N/A 06/25/2020   Procedure: LOOP RECORDER REMOVAL;  Surgeon: Constance Haw, MD;  Location: [place] CV LAB;  Service: Cardiovascular;  Laterality: N/A;   MINI METER   09/08/2014   ELECTRONIC INSULIN PUMP   PACEMAKER IMPLANT N/A 06/25/2020   Procedure: PACEMAKER IMPLANT;  Surgeon: Constance Haw, MD;  Location: [place] CV LAB;  Service: Cardiovascular;  Laterality: N/A;   TEE WITHOUT CARDIOVERSION N/A 10/16/2018   Procedure: TRANSESOPHAGEAL ECHOCARDIOGRAM (TEE);  Surgeon: Buford Dresser,  MD;  Location: [place];  Service: Cardiovascular;  Laterality: N/A;   THYROIDECTOMY  2006   for thyroid cancer     Social History:   reports that she has never smoked. She has never used smokeless tobacco. She reports that she does not drink alcohol and does not use drugs.   Family History:  Her family history includes Diabetes in her maternal aunt; Early death in her father; Heart attack in her maternal grandfather and mother; Heart disease in her maternal aunt, maternal grandfather, and mother; Heart failure in her maternal grandfather; Hyperlipidemia in her father and mother; Hypertension in her father and mother; Prostate cancer in her paternal uncle; Renal Disease in her mother; Stroke in her son. There is no history of Breast cancer.   Allergies Allergies  Allergen Reactions   Latex Rash     Home Medications  Prior to Admission medications   Medication Sig Start Date End Date Taking? Authorizing Provider  acetaminophen (TYLENOL) 325 MG tablet Take 2 tablets (650 mg total) by mouth every 6 (six) hours as needed for moderate pain. 08/30/21  Yes Angiulli, Lavon Paganini, PA-C  amLODipine (NORVASC) 10 MG tablet Take 10 mg by mouth daily.   Yes [provider]  apixaban (ELIQUIS) 5 MG TABS tablet Take 1 tablet (5 mg total) by mouth 2 (two) times daily. 11/16/21  Yes [name], Makiera, MD  atorvastatin (LIPITOR) 80 MG tablet Take 1 tablet (80 mg total) by mouth every evening. 11/16/21  Yes [name], Makiera, MD  busPIRone (BUSPAR) 5 MG tablet Take 1 tablet (5 mg total) by mouth 3 (three) times daily. 12/07/21  Yes [name], Makiera, MD  FLUoxetine (PROZAC) 10 MG capsule Take 1 capsule (10 mg total) by mouth daily. 11/16/21  Yes [name], Makiera, MD  FLUoxetine (PROZAC) 20 MG capsule Take 1 capsule (20 mg total) by mouth daily. 11/16/21  Yes [name], Makiera, MD  fluticasone (FLONASE) 50 MCG/ACT nasal spray Place 1 spray into both nostrils daily. 02/02/22  Yes Welborn, Ryan, DO  furosemide (LASIX) 40 MG tablet Take 1 tablet (40 mg total) by mouth 2 (two) times daily. 01/20/22  Yes Baldwin Jamaica, PA-C  gabapentin (NEURONTIN) 300 MG capsule Take 1 capsule (300 mg total) by mouth 3 (three) times daily. 11/16/21  Yes [name], Makiera, MD  insulin aspart (NOVOLOG) 100 UNIT/ML injection Inject 5 Units into the skin 3 (three) times daily before meals. For blood sugar greater than or equal to 200 10/27/21  Yes Medina-Vargas, Monina C, NP  Insulin Human (INSULIN PUMP) SOLN Inject 1 each into the skin 3 times daily with meals, bedtime and 2 AM. 08/10/21  Yes Sharion Settler, DO  levothyroxine (SYNTHROID) 175 MCG tablet Take 1 tablet (175 mcg total) by mouth daily before breakfast. 11/16/21  Yes [name], Makiera, MD  loratadine (CLARITIN) 10 MG tablet Take 10 mg by mouth every evening.   Yes [provider]  metoprolol succinate (TOPROL-XL) 50 MG 24 hr tablet Take 1 tablet (50 mg total) by mouth in the morning and at bedtime. Take with or immediately following a meal. 01/20/22 04/20/22 Yes Baldwin Jamaica, PA-C  pantoprazole (PROTONIX) 40 MG tablet Take 1 tablet (40 mg total) by mouth 2 (two) times daily. 10/27/21  Yes Medina-Vargas, Monina C, NP  polyethylene glycol powder (GLYCOLAX/MIRALAX) 17 GM/SCOOP powder Take 17 g by mouth daily. Patient taking differently: Take 17 g by mouth as needed. 10/27/21  Yes Medina-Vargas, Monina C, NP  potassium chloride (KLOR-CON) 10 MEQ tablet  Take 1 tablet (10 mEq total) by mouth daily. 10/27/21  Yes Medina-Vargas, Monina C, NP  amLODipine (NORVASC) 5 MG tablet Take 0.5 tablets (2.5 mg total) by mouth daily. Patient not taking: Reported on 02/10/2022 01/20/22 02/19/22  Baldwin Jamaica, PA-C  fexofenadine (ALLEGRA) 180 MG tablet Take 1 tablet (180 mg total) by mouth daily as needed for allergies or rhinitis. Patient not taking: Reported on 01/20/2022 10/27/21   Medina-Vargas, Monina C, NP  ISOSORBIDE PO Isosorbide    [provider]  nystatin (MYCOSTATIN) 100000 UNIT/ML suspension Take 5 mLs (500,000 Units total) by mouth 4 (four) times daily. Patient not taking: Reported on 01/20/2022 11/18/21   Rise Patience, DO      Georgann Housekeeper, AGACNP-BC [facility]  See Amion for personal pager PCCM on call pager [phone] until 7pm. Please call Elink 7p-7a. [phone]  02/13/2022 9:28 AM

## 2022-02-14 DIAGNOSIS — R0602 Shortness of breath: Secondary | ICD-10-CM | POA: Diagnosis not present

## 2022-02-14 LAB — CBC
HCT: 30.3 % — ABNORMAL LOW (ref 36.0–46.0)
Hemoglobin: 9.4 g/dL — ABNORMAL LOW (ref 12.0–15.0)
MCH: 28.1 pg (ref 26.0–34.0)
MCHC: 31 g/dL (ref 30.0–36.0)
MCV: 90.4 fL (ref 80.0–100.0)
Platelets: 199 10*3/uL (ref 150–400)
RBC: 3.35 MIL/uL — ABNORMAL LOW (ref 3.87–5.11)
RDW: 14.5 % (ref 11.5–15.5)
WBC: 4.7 10*3/uL (ref 4.0–10.5)
nRBC: 0 % (ref 0.0–0.2)

## 2022-02-14 LAB — BLOOD GAS, ARTERIAL
Acid-Base Excess: 8.7 mmol/L — ABNORMAL HIGH (ref 0.0–2.0)
Bicarbonate: 32.8 mmol/L — ABNORMAL HIGH (ref 20.0–28.0)
Drawn by: 59156
FIO2: 24 %
O2 Saturation: 97.3 %
Patient temperature: 36.3
pCO2 arterial: 41 mmHg (ref 32–48)
pH, Arterial: 7.51 — ABNORMAL HIGH (ref 7.35–7.45)
pO2, Arterial: 76 mmHg — ABNORMAL LOW (ref 83–108)

## 2022-02-14 LAB — BASIC METABOLIC PANEL
Anion gap: 10 (ref 5–15)
BUN: 16 mg/dL (ref 8–23)
CO2: 26 mmol/L (ref 22–32)
Calcium: 8.3 mg/dL — ABNORMAL LOW (ref 8.9–10.3)
Chloride: 103 mmol/L (ref 98–111)
Creatinine, Ser: 0.89 mg/dL (ref 0.44–1.00)
GFR, Estimated: >60 mL/min (ref >60)
Glucose, Bld: 181 mg/dL — ABNORMAL HIGH (ref 70–99)
Potassium: 3.4 mmol/L — ABNORMAL LOW (ref 3.5–5.1)
Sodium: 139 mmol/L (ref 135–145)

## 2022-02-14 LAB — GLUCOSE, CAPILLARY
Glucose-Capillary: 163 mg/dL — ABNORMAL HIGH (ref 70–99)
Glucose-Capillary: 181 mg/dL — ABNORMAL HIGH (ref 70–99)
Glucose-Capillary: 188 mg/dL — ABNORMAL HIGH (ref 70–99)
Glucose-Capillary: 204 mg/dL — ABNORMAL HIGH (ref 70–99)
Glucose-Capillary: 243 mg/dL — ABNORMAL HIGH (ref 70–99)
Glucose-Capillary: 95 mg/dL (ref 70–99)

## 2022-02-14 MED ORDER — POTASSIUM CHLORIDE CRYS ER 20 MEQ PO TBCR
40.0000 meq | EXTENDED_RELEASE_TABLET | Freq: Once | ORAL | Status: AC
  Administered 2022-02-14: 40 meq via ORAL
  Filled 2022-02-14: qty 2

## 2022-02-14 MED ORDER — INSULIN PUMP
Freq: Three times a day (TID) | SUBCUTANEOUS | Status: DC
  Filled 2022-02-14: qty 1

## 2022-02-14 NOTE — Progress Notes (Signed)
FPTS Brief Progress Note  S: Patient sleeping family   O: BP (!) 139/50 (BP Location: Right Arm)    Pulse 100    Temp 97.9 F (36.6 C) (Oral)    Resp 20    Ht 5\' 7"  (1.702 m)    Wt 110.8 kg    SpO2 94%    BMI 38.26 kg/m   General: Patient sleeping soundly Respiratory: Patient wearing BiPAP, oxygen saturation 96%  A/P: Acute Hypoxic respiratory failure   HFpEF   pulmonary hypertension Patient wearing BiPAP tonight, breathing comfortably with saturations at 96% -Continue plan outlined in day team's progress note  Precious Gilding, DO 02/14/2022, 10:07 PM PGY-1, [facility] Night Resident  Please page [phone] with questions.

## 2022-02-14 NOTE — Plan of Care (Signed)

## 2022-02-14 NOTE — Progress Notes (Signed)
PCCM INTERVAL PROGRESS NOTE  ABG reviewed  ABG    Component Value Date/Time   PHART 7.51 (H) 02/14/2022 1443   PCO2ART 41 02/14/2022 1443   PO2ART 76 (L) 02/14/2022 1443   HCO3 32.8 (H) 02/14/2022 1443   TCO2 16 (L) 04/15/2019 1333   ACIDBASEDEF 10.0 (H) 04/15/2019 1333   O2SAT 97.3 02/14/2022 1443    No acute intervention indicated  Encourage BiPAP compliance.    Georgann Housekeeper, AGACNP-BC [place] [facility]  See Amion for personal pager PCCM on call pager [phone] until 7pm. Please call Elink 7p-7a. [phone]  02/14/2022 4:23 PM

## 2022-02-14 NOTE — Progress Notes (Signed)
NAME:  [name], MRN:  [id number], DOB:  14-Mar-1951, LOS: 4 ADMISSION DATE:  02/10/2022, CONSULTATION DATE:  2/20 REFERRING MD:  Dr. Wyline Mood, CHIEF COMPLAINT:  Pulmonary hypertension   History of Present Illness:  71 year old female with PMH as below, which is significant for CVA, wheelchair bound, AF on Eliquis, OSA on CPAP, CHB s/p pacemaker, DM1 on pump, HTN, hypothyroid, and thyroid cancer. She presented to [facility] ED 2/17 with complaints of lower extremity edema and dyspnea. Complained also of occasionally productive cough for yellow sputum. Had not been compliant with lasix. O2 sats as low as 79% in the ED. She was felt to be volume overloaded and was admitted for diuresis. She diureised 2.5L since admission and continued to require oxygen. Echocardiogram was done, and demonstrated significatn elevation in pulmonary artery pressures without evidence of systolic dysfunction. PCCM was asked to evaluate.   The patient tells me she has been having lower extremity edema and dyspnea off and on for a period of months. Occasionally productive cough. Never bloody. Denied fevers/chills/sick contacts. No chest pain. Does not describe orthopnea or PND. She has stopped using her CPAP for the past 6 months at least because it makes her mouth dry, and has stopped seeing her sleep specialist at the neurology office because "they just tell her to use the CPAP" and she is done with it. She feels her lower extremity edema and breathing have improved to near baseline. It is not unusual for her to get winded with conversation.   Pertinent  Medical History   has a past medical history of Abnormal echocardiogram, Anemia, Arthritis, BPPV (benign paroxysmal positional vertigo), right (06/13/2017), Bradycardia (03/07/2020), Cerebrovascular accident (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 ([place]) (10/29/2015), CHB (complete heart block) ([place]) (03/08/2020), CHF (congestive heart failure) ([place]),  Closed fracture of left proximal humerus (07/08/2019), Decreased range of motion of left shoulder (09/03/2020), Depression, Dizzy (03/07/2020), Edentulous (09/03/2020), GERD (gastroesophageal reflux disease), Hemiparesis affecting left side as late effect of cerebrovascular accident (CVA) ([place]) (03/08/2020), Hemorrhoids, History of Falls with injury (03/08/2020), Hyperlipidemia, Hypertension, Hypothyroidism, Intracranial vascular stenosis, Lacunar infarction ([place]), Near syncope (03/08/2020), Paroxysmal atrial fibrillation (HCC), Presence of permanent cardiac pacemaker, Proteinuria (03/08/2020), Seasonal allergies, Stroke ([place]), Thyroid cancer ([place]), and Type 1 diabetes mellitus ([place]).   Significant Hospital Events: Including procedures, antibiotic start and stop dates in addition to other pertinent events   2/17 admit for diuresis 2/19 echo: PASP estimated at leats 39mmHg.  LVEF 60-65%. No evidence of significant mitral or aortic valve disease. RV not well visualized. Mild to mod TR.   Interim History / Subjective:  Breathing OK this morning. No complaints.   Objective   Blood pressure 123/88, pulse (!) 52, temperature (!) 97.4 F (36.3 C), temperature source Oral, resp. rate 19, height 5\' 7"  (1.702 m), weight 110.8 kg, SpO2 94 %.        Intake/Output Summary (Last 24 hours) at 02/14/2022 1038 Last data filed at 02/14/2022 0533 Gross per 24 hour  Intake 360 ml  Output 1400 ml  Net -1040 ml    Filed Weights   02/12/22 0429 02/13/22 0006 02/14/22 0354  Weight: 109.4 kg 110.9 kg 110.8 kg    Examination:  General: Obese elderly female in NAD HENT: [place]/AT, PERRL, no JVD. Edentulous Lungs: Bibasilar coarse crackles. Off O2 this morning with sats in the 88-95% range. Cardiovascular: paced at 50. No MRG.  Abdomen: Soft, ND, NT Extremities: No acute deformity Neuro: Alert, oriented, non-focal.  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 PASP on echocardiogram estimated to be at least 75 mmHg. She does not have right heart cath on file. She does seem to be volume overloaded on exam. Echocardiogram does not describe left sided heart pathology. She is anticoagulated for AF reducing likelihood of chronic thromboembolic disease. Would be a late presentation of autoimmune disease. No known lung parenchymal disease.   Most likely etiology is severe obstructive sleep apnea. Sleep study in 2020 with AHI of 78/hr. Failed CPAP titration study later that year and was recommended BiPAP 22/16, which was later increased to 22/18. She is followed in the neurology office for this. Sounds like she was initially using her BiPAP, however it is unclear when she stopped. It has been at least 6 months based on patient reporting and EMR. Seems like compliance was spotty at best prior to stopping entirely. Stopped because it "dried her out"  Recommendations: -Nocturnal BiPAP 22/18 if she will tolerate -She did not use BiPAP at all last night. I have addressed this with the patient and RT.  -Cannot stress the importance of BiPAP compliance enough -Supplemental Oxygen as needed to keep O2 sats in the 88-95% range. Avoid sats greater than 95%. -Diuresis per primary team.  -Once volume status is optimized, could consider right heart cath to confirm [facility] -Will need goals of care discussions if she is unable/unwilling to wear BiPAP.    Best Practice (right click and "Reselect all SmartList Selections" daily)   Per FMTS   Georgann Housekeeper, AGACNP-BC [place] [facility]  See Amion for personal pager PCCM on call pager [phone] until 7pm. Please call Elink 7p-7a. [phone]  02/14/2022 10:38 AM

## 2022-02-14 NOTE — Progress Notes (Addendum)
Family Medicine Teaching Service Daily Progress Note Intern Pager: [phone]  Patient name: [name] Medical record number: [id number] Date of birth: 1951/04/29 Age: 71 y.o. Gender: female  Primary Care Provider: Eulis Foster, MD Consultants: Pulmonology Code Status: FULL  Pt Overview and Major Events to Date:  2/17- Admitted   Assessment and Plan:   Julieana Eshleman Lavora Brisbon is a 71 y.o. female presenting with hypoxic respiratory failure and BLE edema. PMH is significant for GERD, HFpEF, OSA, DM1, Hypothyroidism, HTN, HLD, pAF, h/o CVA (2016 and 2019), thyroid cancer s/p thyroidectomy.    Acute Hypoxemic Respiratory Failure   HFpEF   Pulmonary HTN  UOP ON 1.4 and unchanged weight. Total out 3.5L. Per pulmonology, continue with BiPAP and no need for autoimmune and V/Q work up per pulmonology. Pulmonary HTN possibly secondary to hypoxemia from uncontrolled OSA. Patient may need RH cath once diuresed appropriately. Pulmonology showed concern for adherence to her BiPAP and medications. Dr. Larae Grooms went to re-evaluate the pt at bedside, patient is alert and oriented x4 and completely understands how to utilize BiPAP and oxygen. She also has assistance with her sister at home. In contact with social work to assist with medications and DME equipment.  -Home Lasix dosing 40 mg BID -BiPAP ON  -PT to see  -Daily weights -Strict I/Os  Hypokalemia  3.4 today, replete as needed   Dysuria  Treating with Keflex (2/18-2/24).  -Monitor symptom improvement -Continue Keflex as above    DM1 Glucose this AM 181, with Medtronix insulin pump  -Continue with insulin pump  -CBG q4hr    HTN 123/88, watch soft diastolics with diuresis  -Continue home Metoprolol and Amlodipine    GERD  -Continue home Protonix    Hypothyroidism s/p thyroidectomy  TSH 3.228 -Continue home Levothyroxine    H/o CVA   Chronic bradycardia s/p pacemaker   pAF  -Continue home Metoprolol  (brady to 50s today) -Eliquis 5mg  2xdaily  -Continue home lipitor    OSA  -BiPAP    FEN/GI: DYS2 PPx: Eliquis  Dispo: Pending TOC assistance, will discharge tomorrow with sister given transportation needs   Subjective:  Patient is doing well this AM and is asking about her oxygen use at home.   Objective: Temp:  [97.4 F (36.3 C)-98 F (36.7 C)] 97.4 F (36.3 C) (02/21 0400) Pulse Rate:  [48-63] 52 (02/21 0949) Resp:  [17-19] 19 (02/21 0400) BP: (105-128)/(42-88) 123/88 (02/21 0949) SpO2:  [94 %-100 %] 94 % (02/21 0400) Weight:  [110.8 kg] 110.8 kg (02/21 0354) General: Alert and oriented in no apparent distress Heart: Regular rate and rhythm with no murmurs appreciated Lungs: CTA bilaterally, no wheezing or crackles  Abdomen: Bowel sounds present, no abdominal pain Skin: Warm and dry Extremities: No lower extremity edema   Laboratory: Recent Labs  Lab 02/12/22 0510 02/13/22 0336 02/14/22 0301  WBC 4.8 5.8 4.7  HGB 10.0* 10.6* 9.4*  HCT 31.2* 32.9* 30.3*  PLT 187 214 199   Recent Labs  Lab 02/10/22 1212 02/11/22 0032 02/12/22 0510 02/13/22 0336 02/14/22 0301  NA 140 141 140 139 139  K 3.8 3.6 3.2* 3.7 3.4*  CL 104 106 104 101 103  CO2 24 25 27 28 26   BUN 15 15 15 16 16   CREATININE 1.10* 1.11* 1.06* 1.11* 0.89  CALCIUM 8.8* 8.7* 8.1* 8.6* 8.3*  PROT 6.9 6.8  --   --   --   BILITOT 1.8* 1.2  --   --   --  ALKPHOS 76 72  --   --   --   ALT 12 10  --   --   --   AST 20 15  --   --   --   GLUCOSE 95 106* 109* 203* 181*    No results found.   Erskine Emery, MD 02/14/2022, 12:53 PM PGY-1, [place] Intern pager: [phone], text pages welcome

## 2022-02-14 NOTE — Care Management Important Message (Signed)
Important Message  Patient Details  Name: Myrical Andujo MRN: [id number] Date of Birth: [birth year]   Medicare Important Message Given:  Yes     Shelda Altes 02/14/2022, 8:48 AM

## 2022-02-14 NOTE — Progress Notes (Signed)
FPTS Brief Progress Note  S: Patient sitting up in bed, watching television, playing candy crush, wanting to chat.  States she is breathing well tonight.    O: BP (!) 105/42 (BP Location: Right Arm)    Pulse 63    Temp 97.7 F (36.5 C) (Oral)    Resp 19    Ht 5\' 7"  (1.702 m)    Wt 110.9 kg    SpO2 97%    BMI 38.29 kg/m   General: Patient is pleasant to speak with, NAD Cardio: Heart rate in the mid 50s Respiratory: Patient able to speak in full sentences, breathing comfortably on 1 L Extremities: No edema of BLEs  A/P: Acute hypoxic respiratory failure   HFpEF   pulmonary hypertension Patient breathing comfortably on 1 L oxygen via nasal cannula with saturations in the high 90s.  BiPAP is ordered for bedtime.  -Continue to follow plan as outlined in day team's progress note  Precious Gilding, DO 02/14/2022, 12:41 AM PGY-1, [facility] Family Medicine Night Resident  Please page [phone] with questions.

## 2022-02-14 NOTE — TOC Progression Note (Addendum)
Transition of Care [facility]) - Progression Note    Patient Details  Name: [name] MRN: [id number] Date of Birth: March 31, 1951  Transition of Care [facility]) CM/SW Contact  Zenon Mayo, RN Phone Number: 02/14/2022, 10:54 AM  Clinical Narrative:    Went to speak with patient, she states she has changed her mind she would like to go with [facility] for [facility] services.  She states Alvis Lemmings was very good but she just wants to try someone different this time around.  NCM made referral to Freddie Breech with [facility] She is able to take referral .  Soc will begin 24 to 48 hrs post dc. Patient states she has a bipap at home with [name], she would like to get a humidifier, NCM contacted [employer] at [phone].  They state they will contact patient on the phone to talk to her about the hudmidiefier.   Expected Discharge Plan: [place] Barriers to Discharge: Continued Medical Work up  Expected Discharge Plan and Services Expected Discharge Plan: [place]   Discharge Planning Services: CM Consult Post Acute Care Choice: [place] arrangements for the past 2 months: Single Family Home                   DME Agency: NA       HH Arranged: RN, PT, Speech Therapy HH Agency: [facility] Date [place]: 02/14/22 Time [place]: 1053 Representative spoke with at [place]: [place] ([place]) Interventions    Readmission Risk Interventions Readmission Risk Prevention Plan 02/13/2022  Transportation Screening Complete  PCP or Specialist Appt within 3-5 Days Complete  HRI or [place] Complete  Social Work Consult for [place] Planning/Counseling Complete  Palliative Care Screening Not Applicable  Medication Review Press photographer) Complete  Some recent data might be hidden

## 2022-02-14 NOTE — TOC Transition Note (Addendum)
Transition of Care [facility]) - CM/SW Discharge Note   Patient Details  Name: [name] MRN: [id number] Date of Birth: Jan 07, 1951  Transition of Care [facility]) CM/SW Contact:  Zenon Mayo, RN Phone Number: 02/14/2022, 9:58 AM   Clinical Narrative:    Patient is for possible dc today, NCM asked MD or [place] orders, [place], [place], [place].  Patient sister of niece will transport her home at dc per patient. There is an order for bipap, NCM made referral to [facility] with [employer], will check into.   Final next level of care: [place] Barriers to Discharge: Continued Medical Work up   Patient Goals and CMS Choice Patient states their goals for this hospitalization and ongoing recovery are:: return home CMS Medicare.gov Compare Post Acute Care list provided to:: Patient Choice offered to / list presented to : Patient  Discharge Placement                       Discharge Plan and Services   Discharge Planning Services: CM Consult Post Acute Care Choice: Home Health            DME Agency: NA       HH Arranged: RN, PT, Speech Therapy HH Agency: [place] ([facility]) Date HH Agency Contacted: 02/13/22 Time [place]: [place] Representative spoke with at [place]: [place] ([place]) Interventions     Readmission Risk Interventions Readmission Risk Prevention Plan 02/13/2022  Transportation Screening Complete  PCP or Specialist Appt within 3-5 Days Complete  HRI or [place] Complete  Social Work Consult for [place] Planning/Counseling Complete  Palliative Care Screening Not Applicable  Medication Review Press photographer) Complete  Some recent data might be hidden

## 2022-02-14 NOTE — Discharge Instructions (Addendum)
Dear [name],   Thank you so much for allowing [place] to be part of your care!  You were admitted to [facility] for heart failure exacerbation. You were treated with Lasix to reduce fluid buildup in your lungs. You were also found to have a UTI which was treated with antibiotics. Please follow up at [facility] for your hospital follow up on 2/23 at 1:45 pm with Dr. Owens Shark.    POST-HOSPITAL & CARE INSTRUCTIONS Please wear your BiPAP machine every night. Talk to your primary doctor about starting Victoza to help with diabetes and weight loss.  Please let PCP/Specialists know of any changes that were made.  Please see medications section of this packet for any medication changes.   DOCTOR'S APPOINTMENT & FOLLOW UP CARE INSTRUCTIONS  Future Appointments  Date Time Provider [place]  02/16/2022  1:45 PM Orvis Brill, DO [facility]  03/02/2022  2:45 PM [place], PA-C [facility]  03/27/2022 11:15 AM Gardiner Barefoot, DPM [facility]  04/06/2022  7:50 AM [facility] DEVICE REMOTES [facility]  07/06/2022  7:50 AM [facility] DEVICE REMOTES [facility]  10/05/2022  7:50 AM [facility] DEVICE REMOTES [facility]  01/04/2023  7:50 AM [facility] DEVICE REMOTES [facility]  04/05/2023  7:50 AM [facility] DEVICE REMOTES [facility]  07/05/2023  7:50 AM [facility] DEVICE REMOTES [facility]    RETURN PRECAUTIONS:   Take care and be well!  [facility]  [place], [place] [address] [phone]    Information on my medicine - ELIQUIS (apixaban)  This medication education was reviewed with me or my healthcare representative as part of my discharge preparation.   Why was Eliquis prescribed for you? Eliquis was prescribed for you to reduce the risk of a blood clot forming that can  cause a stroke if you have a medical condition called atrial fibrillation (a type of irregular heartbeat).  What do You need to know about Eliquis ? Take your Eliquis TWICE DAILY - one tablet in the morning and one tablet in the evening with or without food. If you have difficulty swallowing the tablet whole please discuss with your pharmacist how to take the medication safely.  Take Eliquis exactly as prescribed by your doctor and DO NOT stop taking Eliquis without talking to the doctor who prescribed the medication.  Stopping may increase your risk of developing a stroke.  Refill your prescription before you run out.  After discharge, you should have regular check-up appointments with your healthcare provider that is prescribing your Eliquis.  In the future your dose may need to be changed if your kidney function or weight changes by a significant amount or as you get older.  What do you do if you miss a dose? If you miss a dose, take it as soon as you remember on the same day and resume taking twice daily.  Do not take more than one dose of ELIQUIS at the same time to make up a missed dose.  Important Safety Information A possible side effect of Eliquis is bleeding. You should call your healthcare provider right away if you experience any of the following: Bleeding from an injury or your nose that does not stop. Unusual colored urine (red or dark brown) or unusual colored stools (red or black). Unusual bruising for unknown reasons. A serious fall or if you hit your head (even if there is no bleeding).  Some medicines may interact with Eliquis and might increase your risk of bleeding or clotting while on Eliquis. To help avoid this, consult your healthcare provider or pharmacist prior to using any new prescription or non-prescription medications, including herbals, vitamins, non-steroidal anti-inflammatory drugs (NSAIDs) and supplements.  This website has more information on Eliquis  (apixaban): http://www.eliquis.com/eliquis/home

## 2022-02-14 NOTE — Progress Notes (Signed)
Physical Therapy Treatment Patient Details Name: [name] MRN: [id number] DOB: [birth year] Today's Date: 02/14/2022   History of Present Illness 71 y.o. female presenting with hypoxic respiratory failure and BLE edema. PMH is significant for GERD, HFpEF, OSA, DM1, Hypothyroidism, HTN, HLD, pAF, h/o CVA (2016 and 2019), thyroid cancer s/p thyroidectomy    PT Comments    Pt making steady progress with mobility. Able to stand with 2 people using Stedy. Continue to recommend HHPT after DC.    Recommendations for follow up therapy are one component of a multi-disciplinary discharge planning process, led by the attending physician.  Recommendations may be updated based on patient status, additional functional criteria and insurance authorization.  Follow Up Recommendations  Home health PT     Assistance Recommended at Discharge Frequent or constant Supervision/Assistance  Patient can return home with the following Two people to help with walking and/or transfers;A lot of help with bathing/dressing/bathroom;Assistance with cooking/housework;Direct supervision/assist for medications management;Assist for transportation;Help with stairs or ramp for entrance   Equipment Recommendations  None recommended by PT    Recommendations for Other Services       Precautions / Restrictions Precautions Precautions: Fall Restrictions Weight Bearing Restrictions: No     Mobility  Bed Mobility Overal bed mobility: Needs Assistance Bed Mobility: Sit to Supine     Supine to sit: Min assist     General bed mobility comments: Assist to elevate trunk into sitting and bring hips to EOB    Transfers Overall transfer level: Needs assistance Equipment used: Ambulation equipment used Transfers: Bed to chair/wheelchair/BSC, Sit to/from Stand Sit to Stand: +2 physical assistance, Mod assist, Min assist           General transfer comment: +2 mod assist to bring hips up to stand from  bed or recliner with Stedy. Min assist to stand from elevated seat of Stedy. Transfer via Lift Equipment: Stedy  Ambulation/Gait             Pre-gait activities: Stood x 6 with [employer] with min assist to maintain static standing for 20-30 sec each     Stairs             Wheelchair Mobility    Modified Rankin (Stroke Patients Only)       Balance Overall balance assessment: Needs assistance Sitting-balance support: Feet supported, Bilateral upper extremity supported Sitting balance-Leahy Scale: Fair     Standing balance support: Bilateral upper extremity supported Standing balance-Leahy Scale: Poor Standing balance comment: Stedy and min assist for static standing                            Cognition Arousal/Alertness: Awake/alert Behavior During Therapy: WFL for tasks assessed/performed Overall Cognitive Status: Within Functional Limits for tasks assessed                                          Exercises      General Comments General comments (skin integrity, edema, etc.): Pt on 1L O2      Pertinent Vitals/Pain Pain Assessment Pain Assessment: No/denies pain Faces Pain Scale: No hurt    Home Living                          Prior Function  PT Goals (current goals can now be found in the care plan section) Acute Rehab PT Goals Patient Stated Goal: be able to walk Progress towards PT goals: Progressing toward goals    Frequency    Min 3X/week      PT Plan Current plan remains appropriate    Co-evaluation              AM-PAC PT "6 Clicks" Mobility   Outcome Measure  Help needed turning from your back to your side while in a flat bed without using bedrails?: A Lot Help needed moving from lying on your back to sitting on the side of a flat bed without using bedrails?: A Lot Help needed moving to and from a bed to a chair (including a wheelchair)?: Total Help needed standing up from a  chair using your arms (e.g., wheelchair or bedside chair)?: Total Help needed to walk in hospital room?: Total Help needed climbing 3-5 steps with a railing? : Total 6 Click Score: 8    End of Session Equipment Utilized During Treatment: Oxygen Activity Tolerance: Patient tolerated treatment well Patient left: with call bell/phone within reach;in chair Nurse Communication: Mobility status;Need for lift equipment PT Visit Diagnosis: Unsteadiness on feet (R26.81);Other abnormalities of gait and mobility (R26.89);Muscle weakness (generalized) (M62.81)     Time: 9179-1505 PT Time Calculation (min) (ACUTE ONLY): 20 min  Charges:  $Therapeutic Activity: 8-22 mins                     [place] Pager [phone] Office [place] 02/14/2022, 1:32 PM

## 2022-02-14 NOTE — Consult Note (Signed)
° °  [facility] CM Inpatient Consult   02/14/2022  Shaun Zuccaro Jun 18, 1951 [id number]  [employer] [ACO] Patient: [employer] Medicare  Primary Care Provider:  Eulis Foster, MD, [facility] is an embedded provider with a Chronic Care Management team and program, and is listed for the transition of care follow up and appointments.  Patient was screened for Embedded practice service needs for chronic care management and noted the patient is with a history active with the Embedded LCSW and recently with Embedded RN, who notes difficulty maintaining contact.   Met with the patient at bedside, sitting up in geri chair eating lunch.  Explained regarding ongoing follow up with provider office.  Patient states, "I'm sorry as I don't always remember certain things." Patient was given and accepted an appointment follow up reminder card and a 24 hour nurse advise line magnet, which was placed with other paperwork on the bedside table.  She asked the difference between office nurse and [facility] nurse.  Explained how it would potentially work for post hospital follow up.  Patient verbalized understanding.  Plan: Notification sent to the [facility] to make aware of TOC needs for post hospital care, when appropriate.  Please contact for further questions,  Natividad Brood, RN BSN Deputy Hospital Liaison  [phone] business mobile phone Toll free office [phone]  Fax number: [phone] [email or web address]

## 2022-02-14 NOTE — Progress Notes (Signed)
Per Eddie Dibbles the NP, patient oxygen level dropped to the 70s. Nurse removed oxygen at 1000 am. Eddie Dibbles NP placed patient back on oxygen at 1 Liter/min and patient is fine now.

## 2022-02-15 ENCOUNTER — Other Ambulatory Visit (HOSPITAL_COMMUNITY): Payer: Self-pay

## 2022-02-15 LAB — CBC
HCT: 30.3 % — ABNORMAL LOW (ref 36.0–46.0)
Hemoglobin: 9.6 g/dL — ABNORMAL LOW (ref 12.0–15.0)
MCH: 28.2 pg (ref 26.0–34.0)
MCHC: 31.7 g/dL (ref 30.0–36.0)
MCV: 89.1 fL (ref 80.0–100.0)
Platelets: 202 10*3/uL (ref 150–400)
RBC: 3.4 MIL/uL — ABNORMAL LOW (ref 3.87–5.11)
RDW: 14.3 % (ref 11.5–15.5)
WBC: 4.8 10*3/uL (ref 4.0–10.5)
nRBC: 0 % (ref 0.0–0.2)

## 2022-02-15 LAB — BASIC METABOLIC PANEL
Anion gap: 11 (ref 5–15)
BUN: 13 mg/dL (ref 8–23)
CO2: 27 mmol/L (ref 22–32)
Calcium: 8.5 mg/dL — ABNORMAL LOW (ref 8.9–10.3)
Chloride: 100 mmol/L (ref 98–111)
Creatinine, Ser: 1 mg/dL (ref 0.44–1.00)
GFR, Estimated: >60 mL/min (ref >60)
Glucose, Bld: 320 mg/dL — ABNORMAL HIGH (ref 70–99)
Potassium: 4 mmol/L (ref 3.5–5.1)
Sodium: 138 mmol/L (ref 135–145)

## 2022-02-15 LAB — GLUCOSE, CAPILLARY
Glucose-Capillary: 337 mg/dL — ABNORMAL HIGH (ref 70–99)
Glucose-Capillary: 326 mg/dL — ABNORMAL HIGH (ref 70–99)
Glucose-Capillary: 248 mg/dL — ABNORMAL HIGH (ref 70–99)
Glucose-Capillary: 89 mg/dL (ref 70–99)

## 2022-02-15 MED ORDER — INSULIN ASPART 100 UNIT/ML IJ SOLN
2.0000 [IU] | Freq: Once | INTRAMUSCULAR | Status: AC
  Administered 2022-02-15: 2 [IU] via SUBCUTANEOUS

## 2022-02-15 MED ORDER — METOPROLOL SUCCINATE ER 50 MG PO TB24
50.0000 mg | ORAL_TABLET | Freq: Every day | ORAL | 0 refills | Status: DC
  Filled 2022-02-15: qty 30, 30d supply, fill #0

## 2022-02-15 MED ORDER — CEPHALEXIN 500 MG PO CAPS
500.0000 mg | ORAL_CAPSULE | Freq: Three times a day (TID) | ORAL | 0 refills | Status: AC
Start: 2022-02-15 — End: 2022-02-17
  Filled 2022-02-15: qty 6, 2d supply, fill #0

## 2022-02-15 NOTE — Assessment & Plan Note (Deleted)
Continue current therapy. 2

## 2022-02-15 NOTE — Progress Notes (Signed)
RT note. Patient currently still on CPAP, CPAP QHS. Patient will come off when ready to wake up. RT will continue to monitor.

## 2022-02-15 NOTE — Discharge Summary (Addendum)
[facility] Discharge Summary  Patient name: [name] Medical record number: [id number] Date of birth: 09/17/1951 Age: 71 y.o. 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 rate 18, height 5\' 7"  (1.702 m), weight 110.6 kg, SpO2 97 %. General: Sleeping with BiPAP in place  Heart: Regular rate and rhythm with no murmurs appreciated Lungs: CTA bilaterally, no wheezing Abdomen: Bowel sounds present, no distension  Skin: Warm and dry Extremities: mild edema noted BLE    Brief Hospital Course:  [name] is a 71 y.o. female presenting with hypoxic respiratory failure and BLE edema. PMH is significant for GERD, HFpEF, OSA, DM1, Hypothyroidism, HTN, HLD, pAF, h/o CVA (2016 and 2019), thyroid cancer s/p thyroidectomy.   Acute hypoxemic respiratory failure   HFpEF   PAH Patient admitted with respiratory distress requiring supplemental oxygen in the setting of non-compliance with Lasix. CXR with no acute findings, no concern for ACS, low PE likelihood and stable anemia. Patient appeared fluid overloaded in the ER and received IV Lasix with good urine output. Oxygen supplementation was able to be weaned and was restarted on home lasix dosing while maintaining good UOP. An echocardiogram showed pulmonary  HTN (PASP 75) possibly secondary to hypoxemia from uncontrolled OSA. Pulmonology was consulted and recommended to continue with BiPAP at night. ABG was ordered and was unremarkable without evidence of hypercarbia. Patient was instructed how to use BiPAP and stressed it's importance at night in addition to home oxygen. She was sent home on 1-2 L of supplemental oxygen.  Dysuria Patient reported pain with urination over the last month. Urine culture obtained and showed 80,000 E. Coli. Treating with Keflex (2/18-2/24).   Type 1 DM Patient's home insulin pump was continued during hospitalization. A1c was 8.3. CBGs were well controlled. Insulin pump was not working at one point in hospitalization, but spot dose of insulin provided during that time.   All other chronic conditions were stable and home medications continued.   Issues for follow-up: Please ensure patient completes antibiotic course. Consider addition of victoza to diabetic regimen.  BiPAP must be used at night, sent home with oxygen as well  Home on Lasix BID, may need change in dosage outpatient Maintain oxygen 88-95% maximum with home O2  Repeat BMP and CBC outpatient  May need cardiology consult and RHC in future with continued diuresis    Significant Procedures: None   Significant Labs and Imaging:  Recent Labs  Lab 02/13/22 0336 02/14/22 0301 02/15/22 0348  WBC 5.8 4.7 4.8  HGB 10.6* 9.4* 9.6*  HCT 32.9* 30.3* 30.3*  PLT 214 199 202   Recent Labs  Lab 02/10/22 1212 02/11/22 0032 02/12/22 0510 02/13/22 0336 02/14/22 0301 02/15/22 0348  NA 140 141 140 139 139 138  K 3.8 3.6 3.2* 3.7 3.4* 4.0  CL 104 106 104 101 103 100  CO2 24 25 27 28 26 27   GLUCOSE 95  106* 109* 203* 181* 320*  BUN 15 15 15 16 16 13   CREATININE 1.10* 1.11* 1.06* 1.11* 0.89 1.00  CALCIUM 8.8* 8.7* 8.1* 8.6* 8.3* 8.5*  MG  --  1.7  --   --   --   --   ALKPHOS 76 72  --   --   --   --   AST 20 15  --   --   --   --   ALT 12 10  --   --    --   --   ALBUMIN 3.0* 2.8*  --   --   --   --     DG Chest Portable 1 View  Result Date: 02/10/2022 CLINICAL DATA:  Shortness of breath, leg swelling EXAM: PORTABLE CHEST 1 VIEW COMPARISON:  Portable exam 1318 hours compared to 10/31/2021 FINDINGS: LEFT subclavian sequential transvenous pacemaker leads project at RIGHT atrium and RIGHT ventricle. Enlargement of cardiac silhouette with pulmonary vascular congestion. Mediastinal contours normal. Atherosclerotic calcification aorta. Chronic accentuation of LEFT perihilar and basilar markings. No acute infiltrate, pleural effusion, or pneumothorax. Osseous demineralization with displaced fracture of the lateral LEFT sixth rib. The list pacemaker projects over cardiac apex. IMPRESSION: Chronic changes. No acute abnormalities. Electronically Signed   By: Lavonia Dana M.D.   On: 02/10/2022 13:31    Echo 02/12/22:  Left Ventricle: Left ventricular ejection fraction, by estimation, is 60  to 65%. The left ventricle has normal function. The left ventricle has no  regional wall motion abnormalities. The left ventricular internal cavity  size was normal in size. There is   no left ventricular hypertrophy. Left ventricular diastolic parameters  are indeterminate.   Right Ventricle: IVC poorly visualized, cannot estimate RA pressure. Based  on TR velocity alone there is severe pulmonary HTN, PASP is at least 75  mmHg. The right ventricular size is not well visualized. Right vetricular  wall thickness was not well  visualized. Right ventricular systolic function was not well visualized.  Results/Tests Pending at Time of Discharge: None   Discharge Medications:  Allergies as of 02/15/2022       Reactions   Latex Rash        Medication List     STOP taking these medications    fexofenadine 180 MG tablet Commonly known as: ALLEGRA   FLUoxetine 10 MG capsule Commonly known as: PROZAC   FLUoxetine 20 MG capsule Commonly known as: PROzac    ISOSORBIDE PO   nystatin 100000 UNIT/ML suspension Commonly known as: MYCOSTATIN       TAKE these medications    acetaminophen 325 MG tablet Commonly known as: TYLENOL Take 2 tablets (650 mg total) by mouth every 6 (six) hours as needed for moderate pain.   amLODipine 5 MG tablet Commonly known as: NORVASC Take 0.5 tablets (2.5 mg total) by mouth daily. What changed: Another medication with the same name was removed. Continue taking this medication, and follow the directions you see here.   apixaban 5 MG Tabs tablet Commonly known as: Eliquis Take 1 tablet (5 mg total) by mouth 2 (two) times daily.   atorvastatin 80 MG tablet Commonly known as: LIPITOR Take 1 tablet (80 mg total) by mouth every evening.   busPIRone 5 MG tablet Commonly known as: BUSPAR Take 1 tablet (5 mg total) by mouth 3 (three) times daily.   cephALEXin 500 MG capsule Commonly known as: KEFLEX Take 1 capsule (500 mg total) by mouth every 8 (eight) hours for 2  days.   fluticasone 50 MCG/ACT nasal spray Commonly known as: FLONASE Place 1 spray into both nostrils daily.   furosemide 40 MG tablet Commonly known as: LASIX Take 1 tablet (40 mg total) by mouth 2 (two) times daily.   gabapentin 300 MG capsule Commonly known as: NEURONTIN Take 1 capsule (300 mg total) by mouth 3 (three) times daily.   insulin aspart 100 UNIT/ML injection Commonly known as: novoLOG Inject 5 Units into the skin 3 (three) times daily before meals. For blood sugar greater than or equal to 200   insulin pump Soln Inject 1 each into the skin 3 times daily with meals, bedtime and 2 AM.   levothyroxine 175 MCG tablet Commonly known as: SYNTHROID Take 1 tablet (175 mcg total) by mouth daily before breakfast.   loratadine 10 MG tablet Commonly known as: CLARITIN Take 10 mg by mouth every evening.   metoprolol succinate 50 MG 24 hr tablet Commonly known as: TOPROL-XL Take 1 tablet (50 mg total) by mouth daily. Take  with or immediately following a meal. Start taking on: February 16, 2022 What changed: when to take this   pantoprazole 40 MG tablet Commonly known as: PROTONIX Take 1 tablet (40 mg total) by mouth 2 (two) times daily.   polyethylene glycol powder 17 GM/SCOOP powder Commonly known as: GLYCOLAX/MIRALAX Take 17 g by mouth daily. What changed:  when to take this reasons to take this   potassium chloride 10 MEQ tablet Commonly known as: KLOR-CON M Take 1 tablet (10 mEq total) by mouth daily.               Durable Medical Equipment  (From admission, onward)           Start     Ordered   02/15/22 1216  For home use only DME oxygen  Once       Question Answer Comment  Length of Need 6 Months   Liters per Minute 1   Frequency Continuous (stationary and portable oxygen unit needed)   Oxygen delivery system Gas      02/15/22 1216   02/13/22 1052  For home use only DME Bipap  Once       Question:  Length of Need  Answer:  12 Months   02/13/22 1052            Discharge Instructions: Please refer to Patient Instructions section of EMR for full details.  Patient was counseled important signs and symptoms that should prompt return to medical care, changes in medications, dietary instructions, activity restrictions, and follow up appointments.   Follow-Up Appointments:  Follow-up Information     Dameron, Luna Fuse, DO. Go on 02/16/2022.   Specialty: Family Medicine Why: Appointment scheduled at 1:45 pm, please arrive at least 15 minutes prior to your scheduled appointment time. Contact information: [place] [address] [phone]         [facility], [facility] Follow up.   Specialty: [place] Why: HHRN,HHPT, [place] will contact you with apt times Contact information: [place] [address] [address], [facility] Follow up.   Why: oxygen Contact information: [address] [place] [address] [phone]                 Erskine Emery, MD 02/15/2022, 1:48 PM PGY-1, [place]

## 2022-02-15 NOTE — Progress Notes (Signed)
Inpatient Diabetes Program Recommendations  AACE/ADA: New Consensus Statement on Inpatient Glycemic Control (2015)  Target Ranges:  Prepandial:   less than 140 mg/dL      Peak postprandial:   less than 180 mg/dL (1-2 hours)      Critically ill patients:  140 - 180 mg/dL   Lab Results  Component Value Date   GLUCAP 89 02/15/2022   HGBA1C 8.3 (H) 02/11/2022    Review of Glycemic Control  Latest Reference Range & Units 02/14/22 19:14 02/14/22 23:43 02/15/22 04:00 02/15/22 04:43 02/15/22 05:54 02/15/22 12:07  Glucose-Capillary 70 - 99 mg/dL 163 (H) 243 (H) 337 (H) 326 (H) 248 (H) 89   Diabetes history: DM 1 Outpatient Diabetes medications: Insulin pump/ [employer] Current orders for Inpatient glycemic control:  Insulin pump  Inpatient Diabetes Program Recommendations:    Spoke with patient regarding elevated blood sugars.  She states that her site was changed yesterday.  I told her that I was concerned that she was not "getting" insulin correctly.  She states that she adjusted site this morning and that blood sugar is now in the 100's.  She does not have insulin pump supplies with her (states she used the site that her sister brought yesterday).  I instructed patient to call her sister and ask her to bring more supplies just in case she needs to change.  She states that she will.  She is currently alert and able to manage insulin pump.  If blood sugars increase again, she will need to change insulin pump site to ensure that insulin is infusing properly.   Thanks  Adah Perl, RN, BC-ADM Inpatient Diabetes Coordinator Pager [phone]  (8a-5p)

## 2022-02-15 NOTE — Progress Notes (Signed)
Speech Language Pathology Treatment: Dysphagia  Patient Details Name: [name] MRN: [id number] DOB: [birth year] Today's Date: 02/15/2022 Time: 6712-4580 SLP Time Calculation (min) (ACUTE ONLY): 10 min  Assessment / Plan / Recommendation Clinical Impression  Pt seen for ongoing dysphagia management.  Per RN pt has been very conscientious with PO intake.  Only intermittent coughing observed.  Some oral cavity residue/pocketing noted.  Pt benefited from verbal cue to use liquid wash.  Pt reports no difficulty and reinforces she would like to continue current diet, both chopped solids and thin liquid with known risk of aspiration.  Pt was able to recall compensatory strategies. Pt exhibited bolus prep set with thin liquid.  Provided written education handout and reinforced education verbally.  Pt feels comfortable with swallow strategies as has no further questions.  Pt has no further acute ST needs.  Recommend continuing speech therapy at next level of care.  Pt has been working with [facility] in the past and has new [place] for her return home. SLP will sign off at this time.      HPI HPI: Pt is a 71 y.o. female presenting with hypoxic respiratory failure and BLE edema. CXR 02/10/22 showed no acute abnormalities. MBS (11/28/21) revealed mild oropharyngeal dysphagia marked by silent aspiration of thin liquids before the swallow and NTL when consumed via straw or with large cup sip. Bolus hold with NTL assisted in elimination of aspiration. Dys 3, NTL recommended. PMH: GERD, HFpEF, OSA, DM1, Hypothyroidism, HTN, HLD, pAF, h/o CVA (2016 and 2019), thyroid cancer s/p thyroidectomy.      SLP Plan  All goals met;Discharge SLP treatment due to (comment)      Recommendations for follow up therapy are one component of a multi-disciplinary discharge planning process, led by the attending physician.  Recommendations may be updated based on patient status, additional functional criteria and  insurance authorization.    Recommendations  Diet recommendations: Dysphagia 2 (fine chop);Thin liquid Liquids provided via: Cup Medication Administration: Whole meds with puree (or COLD liquids) Supervision: Patient able to self feed Compensations: Clear throat intermittently;Follow solids with liquid (Hold liquid in mouth briefly prior to swallow) Postural Changes and/or Swallow Maneuvers: Seated upright 90 degrees                Oral Care Recommendations: Oral care BID Follow Up Recommendations: Home health SLP Assistance recommended at discharge: Intermittent Supervision/Assistance SLP Visit Diagnosis: Dysphagia, oropharyngeal phase (R13.12) Plan: All goals met;Discharge SLP treatment due to (comment)           Celedonio Savage, [place], [place] Office: [phone]   02/15/2022, 10:08 AM

## 2022-02-15 NOTE — Progress Notes (Signed)
SATURATION QUALIFICATIONS: (This note is used to comply with regulatory documentation for home oxygen)  Patient Saturations on Room Air at Rest = 92%  Patient Saturations on Room Air while Ambulating = 81%  Patient Saturations on 26 Liters of oxygen while Ambulating = 94%  Please briefly explain why patient needs home oxygen:[place] with orders for 02

## 2022-02-15 NOTE — Assessment & Plan Note (Deleted)
Continue current therapy 

## 2022-02-15 NOTE — TOC Transition Note (Addendum)
Transition of Care [facility]) - CM/SW Discharge Note   Patient Details  Name: [name] MRN: [id number] Date of Birth: 07/18/1951  [facility] CM/SW Contact:  Zenon Mayo, RN Phone Number: 02/15/2022, 11:23 AM   Clinical Narrative:    Patient is for dc today, NCM informed Freddie Breech with [facility].  Patient is now needing home oxygen.  She states she is ok with [employer] supplying this for her.  NCM made referral to [facility] with [employer] for home oxygen.    Final next level of care: [place] Barriers to Discharge: Continued Medical Work up   Patient Goals and CMS Choice Patient states their goals for this hospitalization and ongoing recovery are:: return home CMS Medicare.gov Compare Post Acute Care list provided to:: Patient Choice offered to / list presented to : Patient  Discharge Placement                       Discharge Plan and Services   Discharge Planning Services: CM Consult Post Acute Care Choice: Home Health            DME Agency: NA       HH Arranged: RN, PT, Speech Therapy HH Agency: [facility] Date [place]: 02/14/22 Time [place]: 1053 Representative spoke with at [place]: [place] ([place]) Interventions     Readmission Risk Interventions Readmission Risk Prevention Plan 02/13/2022  Transportation Screening Complete  PCP or Specialist Appt within 3-5 Days Complete  HRI or [place] Complete  Social Work Consult for [place] Planning/Counseling Complete  Palliative Care Screening Not Applicable  Medication Review Press photographer) Complete  Some recent data might be hidden

## 2022-02-15 NOTE — Plan of Care (Signed)

## 2022-02-15 NOTE — Progress Notes (Signed)
PCCM:  Pulmonary will see as needed. Please reach out for any further questions.   [place] 02/15/2022 8:29 AM

## 2022-02-15 NOTE — Assessment & Plan Note (Deleted)
Test

## 2022-02-16 ENCOUNTER — Inpatient Hospital Stay: Payer: Medicare PPO | Admitting: Student

## 2022-02-20 ENCOUNTER — Telehealth: Payer: Self-pay | Admitting: *Deleted

## 2022-02-20 ENCOUNTER — Telehealth: Payer: Self-pay

## 2022-02-20 NOTE — Telephone Encounter (Signed)
Sonia Baller from [facility] calling for speech thearpy verbal orders as follows:  1 time(s) weekly for 4 week(s)  Verbal orders given per [facility] protocol  Talbot Grumbling, RN

## 2022-02-20 NOTE — Telephone Encounter (Signed)
Home health RN from [facility] wanted to inform PCP that patient had a skin tear when they were in her home doing their assessment.  They will do standard wound care on it/dressing change. If provider wants anything specific, the order can be faxed to [phone].  No return number was provided in the voicemail.  Brenn Gatton,CMA

## 2022-02-21 ENCOUNTER — Telehealth: Payer: Self-pay | Admitting: *Deleted

## 2022-02-21 NOTE — Chronic Care Management (AMB) (Unsigned)
°  Care Management   Note  02/21/2022 Name: [name] MRN: [id number] DOB: 12/07/1951  [name] [name] is a 71 y.o. year old female who is a primary care patient of [name], [name], MD and is actively engaged with the care management team. I reached out to [name] by phone today to assist with scheduling a follow up visit with the RN Case Manager  Follow up plan: Unsuccessful telephone outreach attempt made. A HIPAA compliant phone message was left for the patient providing contact information and requesting a return call.  The care management team will reach out to the patient again over the next 7 days.  If patient returns call to provider office, please advise to call [place]  at [place] Management  Direct Dial: [phone]

## 2022-02-24 ENCOUNTER — Ambulatory Visit: Payer: Medicare PPO | Admitting: Physical Medicine & Rehabilitation

## 2022-02-27 DIAGNOSIS — E1065 Type 1 diabetes mellitus with hyperglycemia: Secondary | ICD-10-CM | POA: Diagnosis not present

## 2022-02-28 NOTE — Progress Notes (Signed)
Cardiology Office Note Date:  02/28/2022  Patient ID:  [name], DOB [birth year], MRN [id number] PCP:  Eulis Foster, MD  Cardiologist:  Dr. Marlou Porch EP: Dr. Curt Bears    Chief Complaint: planned f/u, post hospital  History of Present Illness: [name] is a 71 y.o. female with history of HTN, DM (type I), HLD, thyroid cancer s/p thyroidectomy, stroke (2016 and 2019), GERD, normal coronaries by cath in 2013, HFpEF, significant atrial ectopy, and via loop found to have AFib. >> symptomatic bradycardia >> PPM   She comes in today to be seen for Dr. Curt Bears, last seen by him July 2022, doing well, no changes were made  She saw [name], PA-C 11/14/21, she was generally doing well from a cardiac perspective, she had a fall recently with some fractured ribs, mobilizing slowly, discussed some personal concerns/burdening family, though all in all doing ok Using her BIPAP at night, no changes were made.  I saw her 11/23/21 No changes since she saw [name] the other [name] No new symptoms or concerns No near syncope or syncope No bleeding or signs of bleeding During the visit she was in/out of an AFlutter/Atach (v paced) rhythm without awareness Her amlodipine reduced and toprol started Planned to see her back and revisit rhythm/burden and perhaps transition to BB alone  Following with neurology w/hx of strokes, working on core strength to improve gait stability  I saw her 01/20/22 She is doing "fine!" She has no CP or cardiac awareness, says if she is having Afib she doesn't know it. She reports essentially nonambulatory, often seated ,elevated her feet some She is edematous, mentions she is NOT taking Torsemide as the list says, that 2 weeks ago she was changed to furosemide '20mg'$  BID, she says by her endocrinologist. She denies SOB but the LE swelling is not improved. She reports that she tends to have some swelling, but this more then usual No dizzy  spells, no near syncope or syncope.  She was unaware of her AF, burden was 12%, planned to reduce her CCB and increase BB She was edematous, furosemide increased, planned to f/u with cardiology team and with EP in a couple months to revisit her AF burden, rates.  She was hospitalized 02/10/22 - 02/15/22 with SOB and LE edema, CHF.  There was some discussion of noncompliance with lasix and home BIPAP/OSA management Also treated for UTI Echo noted showed pulmonary HTN (PASP 75) possibly secondary to hypoxemia from uncontrolled OSA. Pulmonology was consulted and recommended to continue with BiPAP at night. ABG was ordered and was unremarkable without evidence of hypercarbia. Discharge on O2, home lasix dosing, urged compliance with meds, BIPAP   TODAY She is alone, her niece brought her, though waiting outside. Her sister is helping take care of her medicines and taking all of them as instructed. She missed her PMD hospital follow up visit. She reports that she is using her BIPAP at night every night and the O2 in the [name].  [name] BIPAP dries her mouth. She denies any cardiac concerns. She says her breathing continues to be better since the hospital She does not ambulate, has not had any resting SOB She has a couple wounds on he LE/foot, she is getting home PT/OT, RN as well She mentions that last week her urine output seemed to slow, but this week is back up. No burning/pain or symptoms of UTI No near syncope or syncope. She mentions that they told her to inquire about  her metoprolol because her HR is in the 50's And if she still needs to be on amlodipine.  She denies any bleeding or signs of bleeding Gt new teeth, they fit her better   Device information MDT ILR, implanted 12/23/2018 (remains implanted) MDT dual chamber PPM implanted 06/25/20   Past Medical History:  Diagnosis Date   Abnormal echocardiogram    Anemia    Arthritis    BPPV (benign paroxysmal positional vertigo), right  06/13/2017   Bradycardia 03/07/2020   Cerebrovascular accident (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 ([place]) 10/29/2015   CHB (complete heart block) ([place]) 03/08/2020   CHF (congestive heart failure) ([place])    Closed fracture of left proximal humerus 07/08/2019   Decreased range of motion of left shoulder 09/03/2020   Depression    Dizzy 03/07/2020   bradycardia   Edentulous 09/03/2020   GERD (gastroesophageal reflux disease)    Hemiparesis affecting left side as late effect of cerebrovascular accident (CVA) ([place]) 03/08/2020   Hemorrhoids    History of Falls with injury 03/08/2020   Fall resulting in left humeral fracture   Hyperlipidemia    Hypertension    Hypothyroidism    Intracranial vascular stenosis    Lacunar infarction ([place])    Brain MRI 07/2021 multiple small remote pontine lacunar infarcts   Near syncope 03/08/2020   Paroxysmal atrial fibrillation ([facility])    Presence of permanent cardiac pacemaker    Proteinuria 03/08/2020   Seasonal allergies    Stroke ([place])    Thyroid cancer ([place])    per pt S/p Total Thyroidectomy with Radioactive Iodine Therapy   Type 1 diabetes mellitus ([place])     Past Surgical History:  Procedure Laterality Date   ABDOMINAL ANGIOGRAM N/A 05/14/2012   Procedure: ABDOMINAL ANGIOGRAM;  Surgeon: Laverda Page, MD;  Location: [facility] CATH LAB;  Service: Cardiovascular;  Laterality: N/A;   ABDOMINAL HYSTERECTOMY  1995   partial   ANKLE FRACTURE SURGERY Right    CARDIAC CATHETERIZATION     with coronary angiogram   cataract  Bilateral 2018   COLONOSCOPY N/A 09/09/2014   Procedure: COLONOSCOPY;  Surgeon: Gatha Mayer, MD;  Location: [facility];  Service: Endoscopy;  Laterality: N/A;   ESOPHAGOGASTRODUODENOSCOPY (EGD) WITH PROPOFOL N/A 12/03/2018   Procedure: ESOPHAGOGASTRODUODENOSCOPY (EGD) WITH PROPOFOL;  Surgeon: Doran Stabler, MD;  Location: [facility];  Service: Gastroenterology;  Laterality: N/A;   LEFT AND RIGHT HEART CATHETERIZATION  WITH CORONARY ANGIOGRAM N/A 05/14/2012   Procedure: LEFT AND RIGHT HEART CATHETERIZATION WITH CORONARY ANGIOGRAM;  Surgeon: Laverda Page, MD;  Location: [facility] CATH LAB;  Service: Cardiovascular;  Laterality: N/A;   LOOP RECORDER INSERTION N/A 12/23/2018   Procedure: LOOP RECORDER INSERTION;  Surgeon: Constance Haw, MD;  Location: [facility] CV LAB;  Service: Cardiovascular;  Laterality: N/A;   LOOP RECORDER REMOVAL N/A 06/25/2020   Procedure: LOOP RECORDER REMOVAL;  Surgeon: Constance Haw, MD;  Location: [facility] CV LAB;  Service: Cardiovascular;  Laterality: N/A;   MINI METER   09/08/2014   ELECTRONIC INSULIN PUMP   PACEMAKER IMPLANT N/A 06/25/2020   Procedure: PACEMAKER IMPLANT;  Surgeon: Constance Haw, MD;  Location: [facility] CV LAB;  Service: Cardiovascular;  Laterality: N/A;   TEE WITHOUT CARDIOVERSION N/A 10/16/2018   Procedure: TRANSESOPHAGEAL ECHOCARDIOGRAM (TEE);  Surgeon: Buford Dresser, MD;  Location: [facility] ENDOSCOPY;  Service: Cardiovascular;  Laterality: N/A;   THYROIDECTOMY  2006   for thyroid cancer    Current Outpatient Medications  Medication Sig Dispense Refill   acetaminophen (TYLENOL) 325 MG tablet Take 2 tablets (650 mg total) by mouth every 6 (six) hours as needed for moderate pain.     amLODipine (NORVASC) 5 MG tablet Take 0.5 tablets (2.5 mg total) by mouth daily. (Patient not taking: Reported on 02/10/2022) 45 tablet 3   apixaban (ELIQUIS) 5 MG TABS tablet Take 1 tablet (5 mg total) by mouth 2 (two) times daily. 180 tablet 0   atorvastatin (LIPITOR) 80 MG tablet Take 1 tablet (80 mg total) by mouth every evening. 90 tablet 1   busPIRone (BUSPAR) 5 MG tablet Take 1 tablet (5 mg total) by mouth 3 (three) times daily. 90 tablet 2   fluticasone (FLONASE) 50 MCG/ACT nasal spray Place 1 spray into both nostrils daily. 16 g 12   furosemide (LASIX) 40 MG tablet Take 1 tablet (40 mg total) by mouth 2 (two) times daily. 180 tablet 3    gabapentin (NEURONTIN) 300 MG capsule Take 1 capsule (300 mg total) by mouth 3 (three) times daily. 90 capsule 1   insulin aspart (NOVOLOG) 100 UNIT/ML injection Inject 5 Units into the skin 3 (three) times daily before meals. For blood sugar greater than or equal to 200 10 mL 11   Insulin Human (INSULIN PUMP) SOLN Inject 1 each into the skin 3 times daily with meals, bedtime and 2 AM.     levothyroxine (SYNTHROID) 175 MCG tablet Take 1 tablet (175 mcg total) by mouth daily before breakfast. 30 tablet 0   loratadine (CLARITIN) 10 MG tablet Take 10 mg by mouth every evening.     metoprolol succinate (TOPROL-XL) 50 MG 24 hr tablet Take 1 tablet (50 mg total) by mouth daily. Take with or immediately following a meal. 30 tablet 0   pantoprazole (PROTONIX) 40 MG tablet Take 1 tablet (40 mg total) by mouth 2 (two) times daily. 60 tablet 0   polyethylene glycol powder (GLYCOLAX/MIRALAX) 17 GM/SCOOP powder Take 17 g by mouth daily. (Patient taking differently: Take 17 g by mouth as needed.) 507 g 0   potassium chloride (KLOR-CON) 10 MEQ tablet Take 1 tablet (10 mEq total) by mouth daily. 30 tablet 0   No current facility-administered medications for this visit.    Allergies:   Latex   Social History:  The patient  reports that she has never smoked. She has never used smokeless tobacco. She reports that she does not drink alcohol and does not use drugs.   Family History:  The patient's family history includes Diabetes in her maternal aunt; Early death in her father; Heart attack in her maternal grandfather and mother; Heart disease in her maternal aunt, maternal grandfather, and mother; Heart failure in her maternal grandfather; Hyperlipidemia in her father and mother; Hypertension in her father and mother; Prostate cancer in her paternal uncle; Renal Disease in her mother; Stroke in her son.  ROS:  Please see the history of present illness.   All other systems are reviewed and otherwise negative.    PHYSICAL EXAM:  VS:  There were no vitals taken for this visit. BMI: There is no height or weight on file to calculate BMI. Well nourished, well developed, in no acute distress  HEENT: normocephalic, atraumatic  Neck: no JVD, carotid bruits or masses Cardiac:  RRR, extrasytoles,  no significant murmurs, no rubs, or gallops Lungs:  CTA b/l, no wheezing, rhonchi or rales, lungs are clear Abd: soft, nontender MS: no deformity or atrophy Ext:  trace  edema  chronic looking skin changes b/l LE, she has a CDI bandage L shin Skin: warm and dry, no rash Neuro:  No gross deficits appreciated Psych: euthymic mood, full affect  PPM site is stable, no tethering or discomfort   EKG:  not done today  PPM interroation done today and reviewed by myself:  Battery and lead measurements are good. She has very frequent PACs today AF burden 29.7% (since Jan 2022) All available EGMs are reviewed, some are false with PACs, others are true Afib, longest episode was leading into the hospitalization Since home only one [name] of AFib   02/12/22: TTE  1. Left ventricular ejection fraction, by estimation, is 60 to 65%. The  left ventricle has normal function. The left ventricle has no regional  wall motion abnormalities. Left ventricular diastolic parameters are  indeterminate.   2. IVC poorly visualized, cannot estimate RA pressure. Based on TR  velocity alone there is severe pulmonary HTN, PASP is at least 75 mmHg. .  Right ventricular systolic function was not well visualized. The right  ventricular size is not well visualized.   3. The mitral valve was not well visualized. No evidence of mitral valve  regurgitation. No evidence of mitral stenosis.   4. Tricuspid valve regurgitation is mild to moderate.   5. The aortic valve is tricuspid. There is moderate calcification of the  aortic valve. There is moderate thickening of the aortic valve. Aortic  valve regurgitation is not visualized. No aortic  stenosis is present.    08/05/2021: TTE IMPRESSIONS   1. Left ventricular ejection fraction, by estimation, is 60 to 65%. The  left ventricle has normal function. The left ventricle has no regional  wall motion abnormalities. There is mild left ventricular hypertrophy.  Left ventricular diastolic parameters  are indeterminate.   2. Right ventricular systolic function is normal. The right ventricular  size is normal. There is normal pulmonary artery systolic pressure.   3. The mitral valve is normal in structure. No evidence of mitral valve  regurgitation. No evidence of mitral stenosis.   4. The aortic valve is tricuspid. Aortic valve regurgitation is not  visualized. Mild to moderate aortic valve sclerosis/calcification is  present, without any evidence of aortic stenosis.   5. The inferior vena cava is normal in size with greater than 50%  respiratory variability, suggesting right atrial pressure of 3 mmHg.   Comparison(s): No significant change from prior study. Prior images  reviewed side by side.    CT CORONARY MORPH W/CTA COR W/SCORE W/CA W/CM &/OR WO/CM Addendum Date: 03/10/2020   IMPRESSION:  1. 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 CT FFR will be performed and reported separately. 2. The patient's coronary artery calcium score is 1371, which places the patient in the 99th percentile. Motion artifact from arrhythmia impacts the quantitation of coronary calcium which maybe overestimated. 3. Normal coronary origin with right dominance. 4. Poor contrast opacification of the tip of the left atrial appendage. Cannot exclude LA appendage thrombus vs incomplete contrast filling  Echocardiogram 03/08/2020 IMPRESSIONS   1. Left ventricular ejection fraction, by estimation, is 60 to 65%. Left  ventricular ejection fraction by PLAX is 63 %. The left ventricle has  normal function. The left ventricle has no regional wall  motion  abnormalities. Left ventricular diastolic  parameters are indeterminate.   2. Right ventricular systolic function is normal. The right ventricular  size is normal. There is  moderately elevated pulmonary artery systolic  pressure.   3. Left atrial size was mildly dilated.   4. The mitral valve is normal in structure. Mild mitral valve  regurgitation. No evidence of mitral stenosis.   5. Tricuspid valve regurgitation is mild to moderate.   6. The aortic valve is tricuspid. Aortic valve regurgitation is not  visualized. No aortic stenosis is present.   7. The inferior vena cava is dilated in size with >50% respiratory  variability, suggesting right atrial pressure of 8 mmHg.      10/14/18: TTE Study Conclusions - Procedure narrative: Transthoracic echocardiography. Technically   difficult study. Intravenous contrast (Definity) was   administered. - Left ventricle: The cavity size was normal. There was moderate   concentric hypertrophy. Systolic function was normal. The   estimated ejection fraction was in the range of 60% to 65%. Wall   motion was normal; there were no regional wall motion   abnormalities. The study is not technically sufficient to allow   evaluation of LV diastolic function. - Left atrium: The atrium was normal in size. - Right atrium: The atrium was normal in size. Impressions: - Technically difficult study. Afib is noted. LVEF 60-65%, moderate   LVH, normal wall motion, normal biatrial size.   Recent Labs: 02/10/2022: B Natriuretic Peptide 289.5 02/11/2022: ALT 10; Magnesium 1.7; TSH 3.228 02/15/2022: BUN 13; Creatinine, Ser 1.00; Hemoglobin 9.6; Platelets 202; Potassium 4.0; Sodium 138  08/04/2021: Cholesterol 102; HDL 44; LDL Cholesterol 50; Total CHOL/HDL Ratio 2.3; Triglycerides 39; VLDL 8   Estimated Creatinine Clearance: 66.1 mL/min (by C-G formula based on SCr of 1 mg/dL).   Wt Readings from Last 3 Encounters:  02/15/22 243 lb 13.3 oz (110.6 kg)   01/20/22 250 lb (113.4 kg)  11/23/21 250 lb (113.4 kg)     Other studies reviewed: Additional studies/records reviewed today include: summarized above  ASSESSMENT AND PLAN:  1. Paroxysmal AFib     CHA2DS2asc is 10, on Eliquis, appropriately dosed      Her burden is high, though suspect if she stays compliant with her BIPAP/O2 this will hopefuly improve Continue metoprolol No AAD right now     2. PPM Intact function No programming changes made Pacer is functioning well, some beat-beat slowing 2/2 PACs   3. CAD     known branch vessel disease     Cards recs in the hospital were to start amlodipine if more CP, will keep it on, given no ongoing CP     F/u with gen cards  4. HTN     A little low today     Stop amlodipine, may help with some of her edema  5. HFpEF ? p.HTN, pulm suspected 2/2 lack of compliance twith her BIPAP and diuretics Will have her follow up with gen cardiology team to stay on top of this with her  She does not look overtly volume OL today Update labs post hospital   Disposition: I will see her back in a couple months to follow her AFib burden, hopefully will be improved with better BIPAP and med compliance as discussed above.  I have advised to her to reach out to her PMD to reschedule her post-hospital visit and UTI, she did complete her antibiotics. As well as Dr. Rexene Alberts for her BIPAP equipment.     Current medicines are reviewed at length with the patient today.  The patient did not have any concerns regarding medicines  Signed, Tommye Standard, PA-C 02/28/2022 1:16 PM  [facility] [place]  [place] [address] [phone] (office)  [phone] (fax)

## 2022-02-28 NOTE — Chronic Care Management (AMB) (Signed)
?  Care Management  ? ?Note ? ?02/28/2022 ?Name: Ashaya Raftery MRN: [id number] DOB: [birth year] ? ?[name] Kaziah Krizek is a 71 y.o. year old female who is a primary care patient of [name], [name], MD and is actively engaged with the care management team. I reached out to [name] by phone today to assist with re-scheduling a follow up visit with the RN Case Manager ? ?Follow up plan: ?Unsuccessful telephone outreach attempt made. A HIPAA compliant phone message was left for the patient providing contact information and requesting a return call.  ?The care management team will reach out to the patient again over the next 7 days.  ?If patient returns call to provider office, please advise to call [facility] at [phone]. ? ?[name]  ?Care Guide, Embedded Care Coordination ?[facility]  Care Management  ?Direct Dial: [phone] ? ?

## 2022-03-02 ENCOUNTER — Ambulatory Visit: Payer: Medicare PPO | Admitting: Physician Assistant

## 2022-03-02 ENCOUNTER — Other Ambulatory Visit: Payer: Self-pay

## 2022-03-02 ENCOUNTER — Encounter: Payer: Self-pay | Admitting: Physician Assistant

## 2022-03-02 VITALS — BP 108/62 | HR 57 | Ht 67.0 in | Wt 243.0 lb

## 2022-03-02 DIAGNOSIS — I10 Essential (primary) hypertension: Secondary | ICD-10-CM | POA: Diagnosis not present

## 2022-03-02 DIAGNOSIS — I480 Paroxysmal atrial fibrillation: Secondary | ICD-10-CM

## 2022-03-02 DIAGNOSIS — Z950 Presence of cardiac pacemaker: Secondary | ICD-10-CM | POA: Diagnosis not present

## 2022-03-02 DIAGNOSIS — I5032 Chronic diastolic (congestive) heart failure: Secondary | ICD-10-CM

## 2022-03-02 DIAGNOSIS — I2510 Atherosclerotic heart disease of native coronary artery without angina pectoris: Secondary | ICD-10-CM | POA: Diagnosis not present

## 2022-03-02 DIAGNOSIS — Z79899 Other long term (current) drug therapy: Secondary | ICD-10-CM | POA: Diagnosis not present

## 2022-03-02 LAB — CUP PACEART INCLINIC DEVICE CHECK
Battery Remaining Longevity: 121 mo
Battery Voltage: 3.02 V
Brady Statistic AP VP Percent: 22.39 %
Brady Statistic AP VS Percent: 9.33 %
Brady Statistic AS VP Percent: 48.29 %
Brady Statistic AS VS Percent: 19.91 %
Brady Statistic RA Percent Paced: 22.85 %
Brady Statistic RV Percent Paced: 72.82 %
Date Time Interrogation Session: 20230309175035
Implantable Lead Implant Date: 20210702
Implantable Lead Implant Date: 20210702
Implantable Lead Location: 753859
Implantable Lead Location: 753860
Implantable Lead Model: 5076
Implantable Lead Model: 5076
Implantable Pulse Generator Implant Date: 20210702
Lead Channel Impedance Value: 285 Ohm
Lead Channel Impedance Value: 342 Ohm
Lead Channel Impedance Value: 342 Ohm
Lead Channel Impedance Value: 418 Ohm
Lead Channel Pacing Threshold Amplitude: 0.5 V
Lead Channel Pacing Threshold Amplitude: 0.5 V
Lead Channel Pacing Threshold Pulse Width: 0.4 ms
Lead Channel Pacing Threshold Pulse Width: 0.4 ms
Lead Channel Sensing Intrinsic Amplitude: 12.25 mV
Lead Channel Sensing Intrinsic Amplitude: 4.25 mV
Lead Channel Sensing Intrinsic Amplitude: 5 mV
Lead Channel Sensing Intrinsic Amplitude: 9.625 mV
Lead Channel Setting Pacing Amplitude: 1.5 V
Lead Channel Setting Pacing Amplitude: 2.5 V
Lead Channel Setting Pacing Pulse Width: 0.4 ms
Lead Channel Setting Sensing Sensitivity: 1.2 mV

## 2022-03-02 NOTE — Patient Instructions (Addendum)
Medication Instructions:  ? ?STOP  TAKING  AMLODIPINE   ? ?*If you need a refill on your cardiac medications before your next appointment, please call your pharmacy* ? ? ?Lab Work:  BMET AND  CBC TODAY  ? ? ?If you have labs (blood work) drawn today and your tests are completely normal, you will receive your results only by: ?MyChart Message (if you have MyChart) OR ?A paper copy in the mail ?If you have any lab test that is abnormal or we need to change your treatment, we will call you to review the results. ? ? ?Testing/Procedures: NONE ORDERED  TODAY ? ? ?PLEASE MAKE APPOINTMENTS WHEN YOU GET HOME:  ? ?WITH PRIMARY:  FOR POST HOSPITAL FOLLOW UP  ?                                             ?NEUROLOGY: FOR BIPAP MACHINE/ EQUIPMENT ? ? ?Follow-Up: ?At [facility], you and your health needs are our priority.  As part of our continuing mission to provide you with exceptional heart care, we have created designated Provider Care Teams.  These Care Teams include your primary Cardiologist (physician) and Advanced Practice Providers (APPs -  Physician Assistants and Nurse Practitioners) who all work together to provide you with the care you need, when you need it. ? ?We recommend signing up for the patient portal called "MyChart".  Sign up information is provided on this After Visit Summary.  MyChart is used to connect with patients for Virtual Visits (Telemedicine).  Patients are able to view lab/test results, encounter notes, upcoming appointments, etc.  Non-urgent messages can be sent to your provider as well.   ?To learn more about what you can do with MyChart, go to [email or web address].   ? ?Your next appointment:   SKAINS/ T[facility]SA IN 1 MONTH  ? ? ?3 month(s) ? ?The format for your next appointment:   ?In Person ? ?Provider:   ?Tommye Standard, PA-C  ? ? ?Other Instructions ? ?

## 2022-03-03 ENCOUNTER — Telehealth: Payer: Self-pay | Admitting: Adult Health

## 2022-03-03 NOTE — Telephone Encounter (Signed)
Pt called stating that she is needing air in her Bipap machine. Please advise. ?

## 2022-03-04 LAB — CBC
Hematocrit: 33.8 % — ABNORMAL LOW (ref 34.0–46.6)
Hemoglobin: 11 g/dL — ABNORMAL LOW (ref 11.1–15.9)
MCH: 28.7 pg (ref 26.6–33.0)
MCHC: 32.5 g/dL (ref 31.5–35.7)
MCV: 88 fL (ref 79–97)
Platelets: 185 10*3/uL (ref 150–450)
RBC: 3.83 x10E6/uL (ref 3.77–5.28)
RDW: 12.9 % (ref 11.7–15.4)
WBC: 4.2 10*3/uL (ref 3.4–10.8)

## 2022-03-04 LAB — BASIC METABOLIC PANEL
BUN/Creatinine Ratio: 12 (ref 12–28)
BUN: 13 mg/dL (ref 8–27)
CO2: 25 mmol/L (ref 20–29)
Calcium: 9 mg/dL (ref 8.7–10.3)
Chloride: 102 mmol/L (ref 96–106)
Creatinine, Ser: 1.05 mg/dL — ABNORMAL HIGH (ref 0.57–1.00)
Glucose: 182 mg/dL — ABNORMAL HIGH (ref 70–99)
Potassium: 3.7 mmol/L (ref 3.5–5.2)
Sodium: 145 mmol/L — ABNORMAL HIGH (ref 134–144)
eGFR: 57 mL/min/{1.73_m2} — ABNORMAL LOW (ref >59)

## 2022-03-06 ENCOUNTER — Telehealth: Payer: Self-pay

## 2022-03-06 NOTE — Telephone Encounter (Signed)
[place] PTA calls nurse line requesting verbal order for urinalysis.  ? ?PTA reports urinary frequency and the patient complained of feeling generally "unwell." PTA denies fever, abdominal/back pain or dysuria.  ? ?VO given to obtain UA.  ?

## 2022-03-06 NOTE — Telephone Encounter (Signed)
Called pt and LVM for her to call back to schedule a f/u.  ?

## 2022-03-06 NOTE — Telephone Encounter (Signed)
Last visit was in 2021, we would need to see the pt to address concerns. ?

## 2022-03-08 NOTE — Chronic Care Management (AMB) (Signed)
?  Care Management  ? ?Note ? ?03/08/2022 ?Name: [name] MRN: [id number] DOB: [birth year] ? ?[name] is a 71 y.o. year old female who is a primary care patient of [name], [name], MD and is actively engaged with the care management team. I reached out to [name] by phone today to assist with re-scheduling a follow up visit with the RN Case Manager ? ?Follow up plan: ?Telephone appointment with care management team member scheduled for:03/16/22 ? ?[name]  ?Care Guide, Embedded Care Coordination ?[facility]  Care Management  ?Direct Dial: [phone] ? ?

## 2022-03-09 DIAGNOSIS — R8271 Bacteriuria: Secondary | ICD-10-CM | POA: Diagnosis not present

## 2022-03-09 DIAGNOSIS — R8290 Unspecified abnormal findings in urine: Secondary | ICD-10-CM | POA: Diagnosis not present

## 2022-03-13 ENCOUNTER — Other Ambulatory Visit: Payer: Self-pay

## 2022-03-13 ENCOUNTER — Encounter: Payer: Self-pay | Admitting: Family Medicine

## 2022-03-13 ENCOUNTER — Ambulatory Visit: Payer: Medicare PPO | Admitting: Family Medicine

## 2022-03-13 DIAGNOSIS — S80829A Blister (nonthermal), unspecified lower leg, initial encounter: Secondary | ICD-10-CM

## 2022-03-13 DIAGNOSIS — R300 Dysuria: Secondary | ICD-10-CM | POA: Diagnosis not present

## 2022-03-13 MED ORDER — CEPHALEXIN 500 MG PO CAPS: 500.0000 mg | ORAL_CAPSULE | Freq: Three times a day (TID) | ORAL | 0 refills | Status: AC

## 2022-03-13 NOTE — Progress Notes (Signed)
? ? ?  SUBJECTIVE:  ? ?CHIEF COMPLAINT / HPI: Leg wounds and urinary symptoms ? ?Leg wounds ?Patient reports having blisters on bilateral lower extremities.  She reports at home health nursing comes out to change her dressing.  She denies any pain in her lower extremities.  She is states that they change the dressing on her legs every 2-3 days.  Her sister also helps to take care of these wounds. ? ?Urinary symptoms ?Patient states that she was treated in the hospital for urinary tract infection.  She states that she believes that this did not resolve.  She was treated with Keflex and states that symptoms seem to have returned in a milder state.  She denies any fevers or chills.  She reports dysuria. ? ?PERTINENT  PMH / PSH:  ?Type 1 DM  ?Wheel chair bound  ?Hx of CVA  ?AF  ?HF ?Hypothyroidism ? ?OBJECTIVE:  ? ?BP 130/61   Pulse 72   SpO2 97%   ?General: female appearing stated age in no acute distress ?Cardio: Normal S1 and S2, no S3 or S4. Rhythm is regularly irregular.  Bilateral radial pulses palpable ?Pulm: Clear to auscultation bilaterally, no crackles, wheezing, or diminished breath sounds. Normal respiratory effort, stable on supplemental oxygen vai [place]   ?Abdomen: Bowel sounds normal. Abdomen soft and with minimal tenderness.  ?Extremities: 2+ bilateral LE edema with dressings on bilateral shins, weeping visualized through dressing  ? ? ?ASSESSMENT/PLAN:  ? ?Friction blisters of leg ?Bilateral LE wounds  ?Currently visited by home health nurses twice weekly  ?Encouraged continued dressing changes as scheduled  ?Patient's sister changes dressings inbetween nurse visits  ?Recommended elevating legs to help decrease drainage  ?No signs of redness or purulent drainage at this time, counseled to watch for changes in the drainage such as increased volume, changes to purulent appearance, enlarging wounds, development of fever or redness  ?Both patient and her family member voiced understanding   ? ?Dysuria ?Previous U/A consistent with UTI in February  ?Will treat with another round of Keflex, if symptoms persist beyond this, will need to perform pelvic exam at follow up appt  ?  ? ? ?Eulis Foster, MD ?[place]  ?

## 2022-03-13 NOTE — Patient Instructions (Signed)
For your urinary symptoms, I prescribed an antibiotic for you to take 3 times daily for the next 10 days. ? ?You will need to be seen urgently if you develop any worsening pain, fever, chills or unable to tolerate food or drink by mouth. ? ?Please continue to have the dressing on your leg is changed regularly and notify our office if you notice increased pain or drainage that has a foul smell associated with the leg wounds. ? ?Please plan to follow-up with our office in 2-3 weeks to check on your urinary symptoms and any changes in your leg wounds. ?

## 2022-03-14 DIAGNOSIS — E1065 Type 1 diabetes mellitus with hyperglycemia: Secondary | ICD-10-CM | POA: Diagnosis not present

## 2022-03-15 DIAGNOSIS — S80829A Blister (nonthermal), unspecified lower leg, initial encounter: Secondary | ICD-10-CM | POA: Insufficient documentation

## 2022-03-15 DIAGNOSIS — E7800 Pure hypercholesterolemia, unspecified: Secondary | ICD-10-CM | POA: Diagnosis not present

## 2022-03-15 DIAGNOSIS — R300 Dysuria: Secondary | ICD-10-CM | POA: Insufficient documentation

## 2022-03-15 DIAGNOSIS — E890 Postprocedural hypothyroidism: Secondary | ICD-10-CM | POA: Diagnosis not present

## 2022-03-15 DIAGNOSIS — E11319 Type 2 diabetes mellitus with unspecified diabetic retinopathy without macular edema: Secondary | ICD-10-CM | POA: Diagnosis not present

## 2022-03-15 DIAGNOSIS — Z9641 Presence of insulin pump (external) (internal): Secondary | ICD-10-CM | POA: Diagnosis not present

## 2022-03-15 DIAGNOSIS — I639 Cerebral infarction, unspecified: Secondary | ICD-10-CM | POA: Diagnosis not present

## 2022-03-15 DIAGNOSIS — Z794 Long term (current) use of insulin: Secondary | ICD-10-CM | POA: Diagnosis not present

## 2022-03-15 DIAGNOSIS — E109 Type 1 diabetes mellitus without complications: Secondary | ICD-10-CM | POA: Diagnosis not present

## 2022-03-15 DIAGNOSIS — I10 Essential (primary) hypertension: Secondary | ICD-10-CM | POA: Diagnosis not present

## 2022-03-15 DIAGNOSIS — C73 Malignant neoplasm of thyroid gland: Secondary | ICD-10-CM | POA: Diagnosis not present

## 2022-03-15 NOTE — Assessment & Plan Note (Signed)
Bilateral LE wounds  ?Currently visited by home health nurses twice weekly  ?Encouraged continued dressing changes as scheduled  ?Patient's sister changes dressings inbetween nurse visits  ?Recommended elevating legs to help decrease drainage  ?No signs of redness or purulent drainage at this time, counseled to watch for changes in the drainage such as increased volume, changes to purulent appearance, enlarging wounds, development of fever or redness  ?Both patient and her family member voiced understanding  ?

## 2022-03-15 NOTE — Assessment & Plan Note (Signed)
Previous U/A consistent with UTI in February  ?Will treat with another round of Keflex, if symptoms persist beyond this, will need to perform pelvic exam at follow up appt  ?

## 2022-03-16 ENCOUNTER — Telehealth: Payer: Medicare PPO

## 2022-03-16 ENCOUNTER — Telehealth: Payer: Self-pay

## 2022-03-16 NOTE — Telephone Encounter (Signed)
? ?  RN Case Manager ?Care Management  ? Phone Outreach  ? ? ?03/16/2022 ?Name: [name] MRN: [id number] DOB: 1951/12/23 ? ?[name] is a 71 y.o. year old female who is a primary care patient of [name], [name], MD .  ? ?Telephone outreach was unsuccessful A HIPPA compliant phone message was left for the patient providing contact information and requesting a return call.  ? ?Follow Up Plan: Will route chart to Care Guide to see if patient would like to reschedule phone appointment.   ? ?Review of patient status, including review of consultants reports, relevant laboratory and other test results, and collaboration with appropriate care team members and the patient's provider was performed as part of comprehensive patient evaluation and provision of care management services.   ? ?Lazaro Arms RN, BSN, [facility] ?Care Management Coordinator ?[place] ?Phone: [phone] Fax: [phone] ?  ? ? ? ? ? ? ? ? ? ? ? ?

## 2022-03-20 ENCOUNTER — Telehealth: Payer: Self-pay | Admitting: *Deleted

## 2022-03-20 NOTE — Telephone Encounter (Signed)
Spoke with patient aware of results and verbalized understanding. Based upon the patient home health nurse her blood pressures have been stable btw 120-130s over 60s ?

## 2022-03-20 NOTE — Telephone Encounter (Signed)
-----   Message from [place], [place] sent at 03/06/2022  3:29 PM EDT ----- ?Labs stable.  See how her BP is at home.  I may want to put her back on low dose amlodipine ?

## 2022-03-27 ENCOUNTER — Ambulatory Visit: Payer: Medicare PPO | Admitting: Podiatry

## 2022-03-27 ENCOUNTER — Encounter: Payer: Self-pay | Admitting: Podiatry

## 2022-03-27 ENCOUNTER — Telehealth: Payer: Self-pay

## 2022-03-27 DIAGNOSIS — D689 Coagulation defect, unspecified: Secondary | ICD-10-CM | POA: Diagnosis not present

## 2022-03-27 DIAGNOSIS — B351 Tinea unguium: Secondary | ICD-10-CM | POA: Diagnosis not present

## 2022-03-27 DIAGNOSIS — E1159 Type 2 diabetes mellitus with other circulatory complications: Secondary | ICD-10-CM | POA: Diagnosis not present

## 2022-03-27 DIAGNOSIS — M79675 Pain in left toe(s): Secondary | ICD-10-CM | POA: Diagnosis not present

## 2022-03-27 DIAGNOSIS — R635 Abnormal weight gain: Secondary | ICD-10-CM | POA: Insufficient documentation

## 2022-03-27 DIAGNOSIS — Z7901 Long term (current) use of anticoagulants: Secondary | ICD-10-CM

## 2022-03-27 DIAGNOSIS — L97909 Non-pressure chronic ulcer of unspecified part of unspecified lower leg with unspecified severity: Secondary | ICD-10-CM | POA: Insufficient documentation

## 2022-03-27 DIAGNOSIS — C73 Malignant neoplasm of thyroid gland: Secondary | ICD-10-CM

## 2022-03-27 DIAGNOSIS — M79674 Pain in right toe(s): Secondary | ICD-10-CM | POA: Diagnosis not present

## 2022-03-27 DIAGNOSIS — E11319 Type 2 diabetes mellitus with unspecified diabetic retinopathy without macular edema: Secondary | ICD-10-CM | POA: Insufficient documentation

## 2022-03-27 DIAGNOSIS — E7800 Pure hypercholesterolemia, unspecified: Secondary | ICD-10-CM | POA: Insufficient documentation

## 2022-03-27 HISTORY — DX: Malignant neoplasm of thyroid gland: C73

## 2022-03-27 NOTE — Progress Notes (Signed)
This patient returns to my office for at risk foot care.  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  This patient is unable to cut nails herself  since the patient cannot reach her  nails.These nails are painful walking and wearing shoes.  Patient presents in a wheelchair .  This patient presents for at risk foot care today. ? ?General Appearance  Alert, conversant and in no acute stress. ? ?Vascular  Dorsalis pedis and posterior tibial  pulses are weakly  palpable  bilaterally.  Capillary return is within normal limits  bilaterally. Temperature is within normal limits  bilaterally. ? ?Neurologic  Senn-Weinstein monofilament wire diminished  bilaterally. Muscle power within normal limits bilaterally. ? ?Nails Thick disfigured discolored nails with subungual debris  from hallux to fifth toes bilaterally. No evidence of bacterial infection or drainage bilaterally. ? ?Orthopedic  No limitations of motion  feet .  No crepitus or effusions noted.  No bony pathology or digital deformities noted.  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 ? ?Skin  normotropic skin with no porokeratosis noted bilaterally.  No signs of infections or ulcers noted.    ? ?Onychomycosis  Pain in right toes  Pain in left toes ? ?Consent was obtained for treatment procedures.   Mechanical debridement of nails 1-5  bilaterally performed with a nail nipper.  Filed with dremel without incident. No infection or ulcer.   ? ? ?Return office visit    3 months       Told patient to return for periodic foot care and evaluation due to potential at risk complications. ? ? ?Gardiner Barefoot DPM  ?

## 2022-03-27 NOTE — Telephone Encounter (Signed)
[place] [facility] calls nurse line requesting verbal orders for [facility] as follows.  ? ?1x every other week for 2 weeks  ?2x a week for 3 weeks ? ?Verbal order given per [facility] protocol.  ?

## 2022-04-03 ENCOUNTER — Encounter (HOSPITAL_BASED_OUTPATIENT_CLINIC_OR_DEPARTMENT_OTHER): Payer: Medicare PPO | Attending: General Surgery | Admitting: General Surgery

## 2022-04-03 DIAGNOSIS — I5032 Chronic diastolic (congestive) heart failure: Secondary | ICD-10-CM | POA: Insufficient documentation

## 2022-04-03 DIAGNOSIS — E10622 Type 1 diabetes mellitus with other skin ulcer: Secondary | ICD-10-CM | POA: Diagnosis not present

## 2022-04-03 DIAGNOSIS — E1042 Type 1 diabetes mellitus with diabetic polyneuropathy: Secondary | ICD-10-CM | POA: Insufficient documentation

## 2022-04-03 DIAGNOSIS — Z6841 Body Mass Index (BMI) 40.0 and over, adult: Secondary | ICD-10-CM | POA: Insufficient documentation

## 2022-04-03 DIAGNOSIS — L97212 Non-pressure chronic ulcer of right calf with fat layer exposed: Secondary | ICD-10-CM | POA: Insufficient documentation

## 2022-04-03 DIAGNOSIS — E1059 Type 1 diabetes mellitus with other circulatory complications: Secondary | ICD-10-CM | POA: Diagnosis not present

## 2022-04-03 DIAGNOSIS — I890 Lymphedema, not elsewhere classified: Secondary | ICD-10-CM | POA: Insufficient documentation

## 2022-04-03 DIAGNOSIS — L97822 Non-pressure chronic ulcer of other part of left lower leg with fat layer exposed: Secondary | ICD-10-CM | POA: Diagnosis not present

## 2022-04-03 DIAGNOSIS — L97312 Non-pressure chronic ulcer of right ankle with fat layer exposed: Secondary | ICD-10-CM | POA: Insufficient documentation

## 2022-04-03 DIAGNOSIS — L97812 Non-pressure chronic ulcer of other part of right lower leg with fat layer exposed: Secondary | ICD-10-CM | POA: Diagnosis not present

## 2022-04-03 NOTE — Progress Notes (Addendum)
[name], [name] ([id number]) ?Visit Report for 04/03/2022 ?Allergy List Details ?Patient Name: Date of Service: ?[name], [name] [name] 04/03/2022 9:00 A M ?Medical Record Number: [id number] ?Patient Account Number: [id number] ?Date of Birth/Sex: Treating RN: ?[birth year][name] (71 y.o. F) [name] [name] ?Primary Care [name] [name]: [name] [name] Other Clinician: ?Referring [name] [name]: ?Treating [name] [name]/Extender: [name] [name] ?[name] [name] ?Weeks in Treatment: 0 ?Allergies ?Active Allergies ?latex ?Reaction: "breakout in sores" ?Allergy Notes ?Electronic Signature(s) ?Signed: 04/03/2022 5:39:08 PM By: Adline Peals ?Entered By: Adline Peals on 04/03/2022 09:26:06 ?-------------------------------------------------------------------------------- ?Arrival Information Details ?Patient Name: Date of Service: ?[name], [name] [name] 04/03/2022 9:00 A M ?Medical Record Number: [id number] ?Patient Account Number: [id number] ?Date of Birth/Sex: Treating RN: ?[birth year][name] (71 y.o. F) [name] [name] ?Primary Care Latria Mccarron: [name] [name] Other Clinician: ?Referring [name] [name]: ?Treating [name] [name]/Extender: [name] [name] ?[name] [name] ?Weeks in Treatment: 0 ?Visit Information ?Patient Arrived: Wheel Chair ?Arrival Time: 09:23 ?Accompanied By: self ?Transfer Assistance: Manual ?Patient Identification Verified: Yes ?Secondary Verification Process Completed: Yes ?Patient Requires Transmission-Based Precautions: No ?Patient Has Alerts: Yes ?Patient Alerts: Patient on Blood Thinner ?pacemaker ?Electronic Signature(s) ?Signed: 04/03/2022 5:39:08 PM By: Adline Peals ?Entered By: Adline Peals on 04/03/2022 09:25:08 ?-------------------------------------------------------------------------------- ?Clinic Level of Care Assessment Details ?Patient Name: Date of Service: ?[name], [name] [name] 04/03/2022 9:00 A M ?Medical Record Number: [id number] ?Patient Account Number: [id number] ?Date of Birth/Sex: Treating RN: ?[name][birth year]  (71 y.o. F) [name] [name] ?Primary Care Kayton Ripp: [name] [name] Other Clinician: ?Referring [name] [name]: ?Treating [name] [name]/Extender: [name] [name] ?[name] [name] ?Weeks in Treatment: 0 ?Clinic Level of Care Assessment Items ?TOOL 1 Quantity Score ?'[]'$  - 0 ?Use when EandM and Procedure is performed on INITIAL visit ?ASSESSMENTS - Nursing Assessment / Reassessment ?X- 1 20 ?General Physical Exam (combine w/ comprehensive assessment (listed just below) when performed on new pt. evals) ?X- 1 25 ?Comprehensive Assessment (HX, ROS, Risk Assessments, Wounds Hx, etc.) ?ASSESSMENTS - Wound and Skin Assessment / Reassessment ?'[]'$  - 0 ?Dermatologic / Skin Assessment (not related to wound area) ?ASSESSMENTS - Ostomy and/or Continence Assessment and Care ?'[]'$  - 0 ?Incontinence Assessment and Management ?'[]'$  - 0 ?Ostomy Care Assessment and Management (repouching, etc.) ?PROCESS - Coordination of Care ?X - Simple Patient / Family Education for ongoing care 1 15 ?'[]'$  - 0 ?Complex (extensive) Patient / Family Education for ongoing care ?X- 1 10 ?Staff obtains Consents, Records, T Results / Process Orders ?est ?'[]'$  - 0 ?Staff telephones HHA, Nursing Homes / Clarify orders / etc ?'[]'$  - 0 ?Routine Transfer to another Facility (non-emergent condition) ?'[]'$  - 0 ?Routine Hospital Admission (non-emergent condition) ?X- 1 15 ?New Admissions / Biomedical engineer / Ordering NPWT Apligraf, etc. ?, ?'[]'$  - 0 ?Emergency Hospital Admission (emergent condition) ?PROCESS - Special Needs ?'[]'$  - 0 ?Pediatric / Minor Patient Management ?'[]'$  - 0 ?Isolation Patient Management ?'[]'$  - 0 ?Hearing / Language / Visual special needs ?'[]'$  - 0 ?Assessment of Community assistance (transportation, D/C planning, etc.) ?'[]'$  - 0 ?Additional assistance / Altered mentation ?'[]'$  - 0 ?Support Surface(s) Assessment (bed, cushion, seat, etc.) ?INTERVENTIONS - Miscellaneous ?'[]'$  - 0 ?External ear exam ?'[]'$  - 0 ?Patient Transfer (multiple staff / Civil Service fast streamer / Similar  devices) ?'[]'$  - 0 ?Simple Staple / Suture removal (25 or less) ?'[]'$  - 0 ?Complex Staple / Suture removal (26 or more) ?'[]'$  - 0 ?Hypo/Hyperglycemic Management (do not check if billed separately) ?X- 1 15 ?Ankle / Brachial Index (ABI) - do not check if billed separately ?Has the patient been seen at the hospital within the  last three years: Yes ?Total Score: 100 ?Level Of Care: New/Established - Level 3 ?Electronic Signature(s) ?Signed: 04/04/2022 4:38:46 PM By: Baruch Gouty RN, BSN ?Entered By: Baruch Gouty on 04/04/2022 15:12:24 ?-------------------------------------------------------------------------------- ?Encounter Discharge Information Details ?Patient Name: ?Date of Service: ?[name] [name] [name] 04/03/2022 9:00 A M ?Medical Record Number: [id number] ?Patient Account Number: [id number] ?Date of Birth/Sex: ?Treating RN: ?[name] [name] (71 y.o. F) [name] [name] ?Primary Care Ashyr Hedgepath: [name] [name] ?Other Clinician: ?Referring [name] [name]: ?Treating [name] [name]/Extender: [name] [name] ?[name] [name] ?Weeks in Treatment: 0 ?Encounter Discharge Information Items Post Procedure Vitals ?Discharge Condition: Stable ?Temperature (F): 97.9 ?Ambulatory Status: Wheelchair ?Pulse (bpm): 52 ?Discharge Destination: Home ?Respiratory Rate (breaths/min): 22 ?Transportation: Private Auto ?Blood Pressure (mmHg): 149/57 ?Accompanied By: family ?Schedule Follow-up Appointment: Yes ?Clinical Summary of Care: Patient Declined ?Electronic Signature(s) ?Signed: 04/03/2022 5:39:08 PM By: Adline Peals ?Entered By: Adline Peals on 04/03/2022 11:47:36 ?-------------------------------------------------------------------------------- ?Lower Extremity Assessment Details ?Patient Name: ?Date of Service: ?[name], [name] [name] 04/03/2022 9:00 A M ?Medical Record Number: [id number] ?Patient Account Number: [id number] ?Date of Birth/Sex: ?Treating RN: ?[birth year], [name] (71 y.o. F) [name] [name] ?Primary Care Aylee Littrell: [name] [name] ?Other Clinician: ?Referring Thatiana Renbarger: ?Treating Zorawar Strollo/Extender: [name] [name] ?[name] [name] ?Weeks in Treatment: 0 ?Edema Assessment ?Assessed: [Left: No] [Right: No] ?E[Left: dema] [Right: :] ?Calf ?Left: Right: ?Point of Measurement: From Medial Instep 42 cm 40.2 cm ?Ankle ?Left: Right: ?Point of Measurement: From Medial Instep 25 cm 23.5 cm ?Vascular Assessment ?Pulses: ?Dorsalis Pedis ?Palpable: [Left:No] [Right:No] ?Blood Pressure: ?Brachial: [Left:146] [Right:146] ?Dorsalis Pedis: 172 ?[Left:Dorsalis Pedis: 782] ?Ankle: ?Posterior Tibial: 212 ?[Left:Posterior Tibial: 200 1.45] [Right:1.37] ?Electronic Signature(s) ?Signed: 04/03/2022 5:39:08 PM By: Adline Peals ?Signed: 04/03/2022 6:48:31 PM By: Baruch Gouty RN, BSN ?Entered By: Adline Peals on 04/03/2022 10:31:44 ?-------------------------------------------------------------------------------- ?Multi Wound Chart Details ?Patient Name: ?Date of Service: ?Campus, [name] [name] 04/03/2022 9:00 A M ?Medical Record Number: [id number] ?Patient Account Number: [id number] ?Date of Birth/Sex: ?Treating RN: ?[birth year][name] (71 y.o. 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

## 2022-04-03 NOTE — Progress Notes (Signed)
[name], [name] ([id number]) ?Visit Report for 04/03/2022 ?Abuse Risk Screen Details ?Patient Name: Date of Service: ?Harren, [name] 04/03/2022 9:00 A M ?Medical Record Number: [id number] ?Patient Account Number: [id number] ?Date of Birth/Sex: Treating RN: ?06/19/1951 (71 y.o. F) Boehlein, [name] ?Primary Care Mozella Rexrode: Stark Klein Other Clinician: ?Referring Quetzalli Clos: ?Treating Anyelin Mogle/Extender: Fredirick Maudlin ?[name], [name] ?Weeks in Treatment: 0 ?Abuse Risk Screen Items ?Answer ?ABUSE RISK SCREEN: ?Has anyone close to you tried to hurt or harm you recentlyo No ?Do you feel uncomfortable with anyone in your familyo No ?Has anyone forced you do things that you didnt want to doo No ?Electronic Signature(s) ?Signed: 04/03/2022 5:39:08 PM By: Adline Peals ?Signed: 04/03/2022 6:48:31 PM By: Baruch Gouty RN, BSN ?Entered By: Adline Peals on 04/03/2022 09:48:41 ?-------------------------------------------------------------------------------- ?Activities of Daily Living Details ?Patient Name: Date of Service: ?[name], [name] 04/03/2022 9:00 A M ?Medical Record Number: [id number] ?Patient Account Number: [id number] ?Date of Birth/Sex: Treating RN: ?1951-11-08 (71 y.o. F) Boehlein, [name] ?Primary Care Mava Suares: Stark Klein Other Clinician: ?Referring Kaityln Kallstrom: ?Treating Kaiden Dardis/Extender: Fredirick Maudlin ?[name], [name] ?Weeks in Treatment: 0 ?Activities of Daily Living Items ?Answer ?Activities of Daily Living (Please select one for each item) ?Drive Automobile Not Able ?T Medications ?ake Need Assistance ?Use T elephone Completely Able ?Care for Appearance Need Assistance ?Use T oilet Need Assistance ?Bath / Shower Need Assistance ?Dress Self Need Assistance ?Feed Self Completely Able ?Walk Not Able ?Get In / Out Bed Need Assistance ?Housework Not Able ?Prepare Meals Not Able ?Handle Money Completely Able ?Shop for Self Not Able ?Electronic Signature(s) ?Signed: 04/03/2022 5:39:08 PM  By: Adline Peals ?Signed: 04/03/2022 6:48:31 PM By: Baruch Gouty RN, BSN ?Entered By: Adline Peals on 04/03/2022 09:50:18 ?-------------------------------------------------------------------------------- ?Education Screening Details ?Patient Name: ?Date of Service: ?Haring, [name] 04/03/2022 9:00 A M ?Medical Record Number: [id number] ?Patient Account Number: [id number] ?Date of Birth/Sex: ?Treating RN: ?[birth year] (71 y.o. F) Boehlein, [name] ?Primary Care Adisa Vigeant: Stark Klein ?Other Clinician: ?Referring Bernon Arviso: ?Treating Giovanni Bath/Extender: Fredirick Maudlin ?[name], [name] ?Weeks in Treatment: 0 ?Learning Preferences/Education Level/Primary Language ?Learning Preference: Explanation, Demonstration, Video, Printed Material ?Highest Education Level: College or Above ?Preferred Language: English ?Cognitive Barrier ?Language Barrier: No ?Translator Needed: No ?Memory Deficit: No ?Emotional Barrier: No ?Cultural/Religious Beliefs Affecting Medical Care: No ?Physical Barrier ?Impaired Vision: Yes Glasses ?Impaired Hearing: No ?Decreased Hand dexterity: No ?Knowledge/Comprehension ?Knowledge Level: Medium ?Comprehension Level: Medium ?Ability to understand written instructions: Medium ?Ability to understand verbal instructions: Medium ?Motivation ?Anxiety Level: Calm ?Cooperation: Cooperative ?Education Importance: Acknowledges Need ?Interest in Health Problems: Asks Questions ?Perception: Coherent ?Willingness to Engage in Self-Management Medium ?Activities: ?Readiness to Engage in Self-Management Medium ?Activities: ?Electronic Signature(s) ?Signed: 04/03/2022 5:39:08 PM By: Adline Peals ?Signed: 04/03/2022 6:48:31 PM By: Baruch Gouty RN, BSN ?Entered By: Adline Peals on 04/03/2022 09:52:16 ?-------------------------------------------------------------------------------- ?Fall Risk Assessment Details ?Patient Name: ?Date of Service: ?[name], [name] 04/03/2022 9:00 A  M ?Medical Record Number: [id number] ?Patient Account Number: [id number] ?Date of Birth/Sex: ?Treating RN: ?Jun 07, 1951 (71 y.o. F) Boehlein, [name] ?Primary Care Antoinne Spadaccini: Stark Klein ?Other Clinician: ?Referring Vineta Carone: ?Treating Caralyn Twining/Extender: Fredirick Maudlin ?[name], [name] ?Weeks in Treatment: 0 ?Fall Risk Assessment Items ?Have you had 2 or more falls in the last 12 monthso 0 Yes ?Have you had any fall that resulted in injury in the last 12 monthso 0 Yes ?FALLS RISK SCREEN ?History of falling - immediate or within 3 months 25 Yes ?Secondary diagnosis (Do you have 2 or more medical diagnoseso) 15 Yes ?  Ambulatory aid ?None/bed rest/wheelchair/nurse 0 Yes ?Crutches/cane/walker 0 No ?Furniture 0 No ?Intravenous therapy Access/Saline/Heparin Lock 0 No ?Gait/Transferring ?Normal/ bed rest/ wheelchair 0 Yes ?Weak (short steps with or without shuffle, stooped but able to lift head while walking, may seek 0 No ?support from furniture) ?Impaired (short steps with shuffle, may have difficulty arising from chair, head down, impaired 0 No ?balance) ?Mental Status ?Oriented to own ability 0 Yes ?Electronic Signature(s) ?Signed: 04/03/2022 5:39:08 PM By: Adline Peals ?Signed: 04/03/2022 6:48:31 PM By: Baruch Gouty RN, BSN ?Entered By: Adline Peals on 04/03/2022 09:53:07 ?-------------------------------------------------------------------------------- ?Foot Assessment Details ?Patient Name: ?Date of Service: ?[name], [name] 04/03/2022 9:00 A M ?Medical Record Number: [id number] ?Patient Account Number: [id number] ?Date of Birth/Sex: ?Treating RN: ?[birth year] (71 y.o. F) Boehlein, [name] ?Primary Care Stevi Hollinshead: Stark Klein ?Other Clinician: ?Referring Aissata Wilmore: ?Treating Keghan Mcfarren/Extender: Fredirick Maudlin ?[name], [name] ?Weeks in Treatment: 0 ?Foot Assessment Items ?Site Locations ?+ = Sensation present, - = Sensation absent, C = Callus, U = Ulcer ?R = Redness, W = Warmth, M = Maceration, PU =  Pre-ulcerative lesion ?F = Fissure, S = Swelling, D = Dryness ?Assessment ?Right: Left: ?Other Deformity: No No ?Prior Foot Ulcer: No No ?Prior Amputation: No No ?Charcot Joint: No No ?Ambulatory Status: Non-ambulatory ?Assistance Device: Wheelchair ?Gait: ?Electronic Signature(s) ?Signed: 04/03/2022 5:39:08 PM By: Adline Peals ?Signed: 04/03/2022 6:48:31 PM By: Baruch Gouty RN, BSN ?Entered By: Adline Peals on 04/03/2022 09:58:51 ?-------------------------------------------------------------------------------- ?Nutrition Risk Screening Details ?Patient Name: ?Date of Service: ?[name], [name] 04/03/2022 9:00 A M ?Medical Record Number: [id number] ?Patient Account Number: [id number] ?Date of Birth/Sex: ?Treating RN: ?16-Feb-1951 (71 y.o. F) Boehlein, [name] ?Primary Care Ahmarion Saraceno: Stark Klein ?Other Clinician: ?Referring Briani Maul: ?Treating Gidget Quizhpi/Extender: Fredirick Maudlin ?[name], [name] ?Weeks in Treatment: 0 ?Height (in): ?Weight (lbs): ?Body Mass Index (BMI): ?Nutrition Risk Screening Items ?Score Screening ?NUTRITION RISK SCREEN: ?I have an illness or condition that made me change the kind and/or amount of food I eat 2 Yes ?I eat fewer than two meals per day 3 Yes ?I eat few fruits and vegetables, or milk products 2 Yes ?I have three or more drinks of beer, liquor or wine almost every day 0 No ?I have tooth or mouth problems that make it hard for me to eat 2 Yes ?I don't always have enough money to buy the food I need 0 No ?I eat alone most of the time 1 Yes ?I take three or more different prescribed or over-the-counter drugs a day 1 Yes ?Without wanting to, I have lost or gained 10 pounds in the last six months 0 No ?I am not always physically able to shop, cook and/or feed myself 2 Yes ?Nutrition Protocols ?Good Risk Protocol ?Moderate Risk Protocol ?High Risk Proctocol 0 Provide education on nutrition ?Risk Level: High Risk ?Score: 13 ?Electronic Signature(s) ?Signed: 04/03/2022 5:39:08  PM By: Adline Peals ?Signed: 04/03/2022 6:48:31 PM By: Baruch Gouty RN, BSN ?Entered By: Adline Peals on 04/03/2022 09:55:02 ?

## 2022-04-04 NOTE — Progress Notes (Addendum)
[name], [name] ([id number]) ?Visit Report for 04/03/2022 ?Chief Complaint Document Details ?Patient Name: Date of Service: ?[name], [name] 04/03/2022 9:00 A M ?Medical Record Number: [id number] ?Patient Account Number: [id number] ?Date of Birth/Sex: Treating RN: ?02/27/1951 (71 y.o. F) Boehlein, [name] ?Primary Care Provider: Stark Klein Other Clinician: ?Referring Provider: ?Treating Provider/Extender: Fredirick Maudlin ?[name], [name] ?Weeks in Treatment: 0 ?Information Obtained from: Patient ?Chief Complaint ?Patient presents for treatment of open ulcers due to venous insufficiency in the setting of poorly controlled DM1 and CHF ?Electronic Signature(s) ?Signed: 04/03/2022 11:04:19 AM By: Fredirick Maudlin MD FACS ?Entered By: Fredirick Maudlin on 04/03/2022 11:04:19 ?-------------------------------------------------------------------------------- ?Debridement Details ?Patient Name: Date of Service: ?Kimmons, [name] 04/03/2022 9:00 A M ?Medical Record Number: [id number] ?Patient Account Number: [id number] ?Date of Birth/Sex: Treating RN: ?Mar 02, 1951 (71 y.o. F) Boehlein, [name] ?Primary Care Provider: Stark Klein Other Clinician: ?Referring Provider: ?Treating Provider/Extender: Fredirick Maudlin ?[name], [name] ?Weeks in Treatment: 0 ?Debridement Performed for Assessment: Wound #1 Right,Anterior Lower Leg ?Performed By: Physician Fredirick Maudlin, MD ?Debridement Type: Debridement ?Severity of Tissue Pre Debridement: Fat layer exposed ?Level of Consciousness (Pre-procedure): Awake and Alert ?Pre-procedure Verification/Time Out Yes - 10:45 ?Taken: ?Start Time: 10:45 ?Pain Control: ?Other : benzocaine 20% ?T Area Debrided (L x W): ?otal 9.1 (cm) x 4 (cm) = 36.4 (cm?) ?Tissue and other material debrided: Viable, Non-Viable, Slough, Subcutaneous, Slough ?Level: Skin/Subcutaneous Tissue ?Debridement Description: Excisional ?Instrument: Curette ?Bleeding: Minimum ?Hemostasis Achieved:  Pressure ?Procedural Pain: 0 ?Post Procedural Pain: 0 ?Response to Treatment: Procedure was tolerated well ?Level of Consciousness (Post- Awake and Alert ?procedure): ?Post Debridement Measurements of Total Wound ?Length: (cm) 9.1 ?Width: (cm) 4 ?Depth: (cm) 0.1 ?Volume: (cm?) 2.859 ?Character of Wound/Ulcer Post Debridement: Improved ?Severity of Tissue Post Debridement: Fat layer exposed ?Post Procedure Diagnosis ?Same as Pre-procedure ?Electronic Signature(s) ?Signed: 04/03/2022 11:47:47 AM By: Fredirick Maudlin MD FACS ?Signed: 04/03/2022 5:39:08 PM By: Adline Peals ?Signed: 04/03/2022 6:48:31 PM By: Baruch Gouty RN, BSN ?Entered By: Adline Peals on 04/03/2022 10:52:21 ?-------------------------------------------------------------------------------- ?Debridement Details ?Patient Name: ?Date of Service: ?[name], [name] 04/03/2022 9:00 A M ?Medical Record Number: [id number] ?Patient Account Number: [id number] ?Date of Birth/Sex: ?Treating RN: ?14-Nov-1951 (72 y.o. F) Boehlein, [name] ?Primary Care Provider: Stark Klein ?Other Clinician: ?Referring Provider: ?Treating Provider/Extender: Fredirick Maudlin ?[name], [name] ?Weeks in Treatment: 0 ?Debridement Performed for Assessment: Wound #3 Left,Medial Lower Leg ?Performed By: Physician Fredirick Maudlin, MD ?Debridement Type: Debridement ?Severity of Tissue Pre Debridement: Fat layer exposed ?Level of Consciousness (Pre-procedure): Awake and Alert ?Pre-procedure Verification/Time Out Yes - 10:45 ?Taken: ?Start Time: 10:45 ?Pain Control: ?Other : benzocaine 20% ?T Area Debrided (L x W): ?otal 5 (cm) x 7.5 (cm) = 37.5 (cm?) ?Tissue and other material debrided: Viable, Non-Viable, Slough, Subcutaneous, Slough ?Level: Skin/Subcutaneous Tissue ?Debridement Description: Excisional ?Instrument: Curette ?Bleeding: Minimum ?Hemostasis Achieved: Pressure ?Procedural Pain: 0 ?Post Procedural Pain: 0 ?Response to Treatment: Procedure was tolerated well ?Level  of Consciousness (Post- Awake and Alert ?procedure): ?Post Debridement Measurements of Total Wound ?Length: (cm) 5 ?Width: (cm) 7.5 ?Depth: (cm) 0.1 ?Volume: (cm?) 2.945 ?Character of Wound/Ulcer Post Debridement: Improved ?Severity of Tissue Post Debridement: Fat layer exposed ?Post Procedure Diagnosis ?Same as Pre-procedure ?Electronic Signature(s) ?Signed: 04/03/2022 11:47:47 AM By: Fredirick Maudlin MD FACS ?Signed: 04/03/2022 5:39:08 PM By: Adline Peals ?Signed: 04/03/2022 6:48:31 PM By: Baruch Gouty RN, BSN ?Entered By: Adline Peals on 04/03/2022 10:53:23 ?-------------------------------------------------------------------------------- ?Debridement Details ?Patient Name: ?Date of Service: ?[name], [name] 04/03/2022 9:00 A M ?  Medical Record Number: [id number] ?Patient Account Number: [id number] ?Date of Birth/Sex: ?Treating RN: ?[birth year] (71 y.o. F) Boehlein, [name] ?Primary Care Provider: Stark Klein ?Other Clinician: ?Referring Provider: ?Treating Provider/Extender: Fredirick Maudlin ?[name], [name] ?Weeks in Treatment: 0 ?Debridement Performed for Assessment: Wound #4 Left,Proximal,Anterior Lower Leg ?Performed By: Physician Fredirick Maudlin, MD ?Debridement Type: Debridement ?Severity of Tissue Pre Debridement: Fat layer exposed ?Level of Consciousness (Pre-procedure): Awake and Alert ?Pre-procedure Verification/Time Out Yes - 10:45 ?Taken: ?Start Time: 10:45 ?Pain Control: ?Other : benzocaine 20% ?T Area Debrided (L x W): ?otal 0.9 (cm) x 1.6 (cm) = 1.44 (cm?) ?Tissue and other material debrided: Viable, Non-Viable, Slough, Subcutaneous, Slough ?Level: Skin/Subcutaneous Tissue ?Debridement Description: Excisional ?Instrument: Curette ?Bleeding: Minimum ?Hemostasis Achieved: Pressure ?Procedural Pain: 0 ?Post Procedural Pain: 0 ?Response to Treatment: Procedure was tolerated well ?Level of Consciousness (Post- Awake and Alert ?procedure): ?Post Debridement Measurements of Total  Wound ?Length: (cm) 0.9 ?Width: (cm) 1.6 ?Depth: (cm) 0.1 ?Volume: (cm?) 0.113 ?Character of Wound/Ulcer Post Debridement: Improved ?Severity of Tissue Post Debridement: Fat layer exposed ?Post Procedure Diagnosis ?Same as Pre-procedure ?Electronic Signature(s) ?Signed: 04/03/2022 11:47:47 AM By: Fredirick Maudlin MD FACS ?Signed: 04/03/2022 5:39:08 PM By: Adline Peals ?Signed: 04/03/2022 6:48:31 PM By: Baruch Gouty RN, BSN ?Entered By: Adline Peals on 04/03/2022 10:54:07 ?-------------------------------------------------------------------------------- ?Debridement Details ?Patient Name: ?Date of Service: ?Esquibel, [name] 04/03/2022 9:00 A M ?Medical Record Number: [id number] ?Patient Account Number: [id number] ?Date of Birth/Sex: ?Treating RN: ?[birth year] (71 y.o. F) Boehlein, [name] ?Primary Care Provider: Stark Klein ?Other Clinician: ?Referring Provider: ?Treating Provider/Extender: Fredirick Maudlin ?[name], [name] ?Weeks in Treatment: 0 ?Debridement Performed for Assessment: Wound #2 Right,Posterior Ankle ?Performed By: Physician Fredirick Maudlin, MD ?Debridement Type: Debridement ?Severity of Tissue Pre Debridement: Fat layer exposed ?Level of Consciousness (Pre-procedure): Awake and Alert ?Pre-procedure Verification/Time Out Yes - 10:45 ?Taken: ?Start Time: 10:45 ?Pain Control: ?Other : benzocaine 20% ?T Area Debrided (L x W): ?otal 1.5 (cm) x 1.2 (cm) = 1.8 (cm?) ?Tissue and other material debrided: Viable, Non-Viable, Slough, Subcutaneous, Slough ?Level: Skin/Subcutaneous Tissue ?Debridement Description: Excisional ?Instrument: Curette ?Bleeding: Minimum ?Hemostasis Achieved: Pressure ?Procedural Pain: 0 ?Post Procedural Pain: 0 ?Response to Treatment: Procedure was tolerated well ?Level of Consciousness (Post- Awake and Alert ?procedure): ?Post Debridement Measurements of Total Wound ?Length: (cm) 1.5 ?Width: (cm) 1.2 ?Depth: (cm) 0.2 ?Volume: (cm?) 0.283 ?Character of Wound/Ulcer Post  Debridement: Improved ?Severity of Tissue Post Debridement: Fat layer exposed ?Post Procedure Diagnosis ?Same as Pre-procedure ?Electronic Signature(s) ?Signed: 04/03/2022 11:47:47 AM By: Fredirick Maudlin MD FACS ?Signed: 04/03/2022

## 2022-04-05 DIAGNOSIS — S81801A Unspecified open wound, right lower leg, initial encounter: Secondary | ICD-10-CM | POA: Diagnosis not present

## 2022-04-06 ENCOUNTER — Telehealth: Payer: Self-pay

## 2022-04-06 ENCOUNTER — Ambulatory Visit (INDEPENDENT_AMBULATORY_CARE_PROVIDER_SITE_OTHER): Payer: Medicare PPO

## 2022-04-06 DIAGNOSIS — I442 Atrioventricular block, complete: Secondary | ICD-10-CM | POA: Diagnosis not present

## 2022-04-06 LAB — CUP PACEART REMOTE DEVICE CHECK
Battery Remaining Longevity: 122 mo
Battery Voltage: 3.02 V
Brady Statistic AP VP Percent: 31.42 %
Brady Statistic AP VS Percent: 9.66 %
Brady Statistic AS VP Percent: 42.14 %
Brady Statistic AS VS Percent: 16.69 %
Brady Statistic RA Percent Paced: 25.04 %
Brady Statistic RV Percent Paced: 72.63 %
Date Time Interrogation Session: 20230412210744
Implantable Lead Implant Date: 20210702
Implantable Lead Implant Date: 20210702
Implantable Lead Location: 753859
Implantable Lead Location: 753860
Implantable Lead Model: 5076
Implantable Lead Model: 5076
Implantable Pulse Generator Implant Date: 20210702
Lead Channel Impedance Value: 266 Ohm
Lead Channel Impedance Value: 342 Ohm
Lead Channel Impedance Value: 418 Ohm
Lead Channel Impedance Value: 475 Ohm
Lead Channel Pacing Threshold Amplitude: 0.5 V
Lead Channel Pacing Threshold Amplitude: 0.5 V
Lead Channel Pacing Threshold Pulse Width: 0.4 ms
Lead Channel Pacing Threshold Pulse Width: 0.4 ms
Lead Channel Sensing Intrinsic Amplitude: 5.375 mV
Lead Channel Sensing Intrinsic Amplitude: 5.375 mV
Lead Channel Sensing Intrinsic Amplitude: 9.5 mV
Lead Channel Sensing Intrinsic Amplitude: 9.5 mV
Lead Channel Setting Pacing Amplitude: 1.5 V
Lead Channel Setting Pacing Amplitude: 2.5 V
Lead Channel Setting Pacing Pulse Width: 0.4 ms
Lead Channel Setting Sensing Sensitivity: 1.2 mV

## 2022-04-06 NOTE — Telephone Encounter (Signed)
Scheduled remote reviewed. Normal device function.   ?Known AF, on [place], AF burden is 40.8% of the time.  Sent to triage per protocol for ongoing AF ?Next remote 91 days. ? ?Kathy Breach, RN, CCDS, [employer] ? ? ?Unsuccessful telephone encounter to patient to assess for s/s of persistent AF (appears to be ongoing since 03/22/22). [employer] compliant VM message left requesting call back to [phone]. ? ? ? ? ? ? ? ? ? ? ?

## 2022-04-07 NOTE — Telephone Encounter (Signed)
Unable to reach patient regarding symptoms of persistent AF, per last OV notes from EP patient is historically unaware of atrial fibrillation.  ?

## 2022-04-09 NOTE — Progress Notes (Signed)
[place] ?Telemedicine Visit ? ?Patient consented to have virtual visit and was identified by name and date of birth. ?Method of visit: Video ? ?Encounter participants: ?Patient: [name] - located at home address  ?Provider: Eulis Foster - located at [facility]  ?Others (if applicable): none  ? ?Chief Complaint: need for new power wheelchair  ? ?HPI: ? ?[name] presents for face-to-face mobility evaluation  ?Patient reports that she needs a new power wheelchair because she needs a chair that will elevated her feet because she has significant leg swelling  ?Patient is currently seeing wound care and reports wearing compression socks to help with venous insufficieny  ?Patient reports using her chair to travel to the kitchen and prepare meals as well as for mobility around the home due to inability to walk  ?She also uses the chair to help with getting to her restroom  ? ? ?ROS: per HPI ? ?Pertinent PMHx:  ?T1DM  ?Left [place] Stroke  ?Left sided weakness  ?Hemiparesis and numbness affecting left side  ? ?Exam:  ? ?Respiratory: speaking in short sentences, on 1 L via [place]  ?Patient in electric wheel chair during video call  ? ?Assessment/Plan: ? ?Hemiparesis and numbness affecting left side as late effect of cerebrovascular accident (CVA) ([place]) ?Patient continues to require assistance with mobility around the home using her electric wheel chair to assist with preparing meals and using the toilet  ?She continues to work with home health PT   ? ? ?Lower extremity edema ?Patient continues to have LE edema with weeping from blisters  ?Wound care home health visits patient regularly  ?She would benefit from electric wheel chair that helps with elevating her lower extremities  ?  ? ?Time spent during visit with patient: 11 minutes ? ?

## 2022-04-10 ENCOUNTER — Telehealth (INDEPENDENT_AMBULATORY_CARE_PROVIDER_SITE_OTHER): Payer: Medicare PPO | Admitting: Family Medicine

## 2022-04-10 ENCOUNTER — Encounter: Payer: Self-pay | Admitting: Family Medicine

## 2022-04-10 ENCOUNTER — Encounter (HOSPITAL_BASED_OUTPATIENT_CLINIC_OR_DEPARTMENT_OTHER): Payer: Medicare PPO | Admitting: General Surgery

## 2022-04-10 DIAGNOSIS — E1059 Type 1 diabetes mellitus with other circulatory complications: Secondary | ICD-10-CM | POA: Diagnosis not present

## 2022-04-10 DIAGNOSIS — Z6841 Body Mass Index (BMI) 40.0 and over, adult: Secondary | ICD-10-CM | POA: Diagnosis not present

## 2022-04-10 DIAGNOSIS — Z950 Presence of cardiac pacemaker: Secondary | ICD-10-CM

## 2022-04-10 DIAGNOSIS — Z993 Dependence on wheelchair: Secondary | ICD-10-CM | POA: Diagnosis not present

## 2022-04-10 DIAGNOSIS — L97312 Non-pressure chronic ulcer of right ankle with fat layer exposed: Secondary | ICD-10-CM | POA: Diagnosis not present

## 2022-04-10 DIAGNOSIS — I69354 Hemiplegia and hemiparesis following cerebral infarction affecting left non-dominant side: Secondary | ICD-10-CM

## 2022-04-10 DIAGNOSIS — L97812 Non-pressure chronic ulcer of other part of right lower leg with fat layer exposed: Secondary | ICD-10-CM | POA: Diagnosis not present

## 2022-04-10 DIAGNOSIS — I5032 Chronic diastolic (congestive) heart failure: Secondary | ICD-10-CM | POA: Diagnosis not present

## 2022-04-10 DIAGNOSIS — R600 Localized edema: Secondary | ICD-10-CM | POA: Diagnosis not present

## 2022-04-10 DIAGNOSIS — E11622 Type 2 diabetes mellitus with other skin ulcer: Secondary | ICD-10-CM | POA: Diagnosis not present

## 2022-04-10 DIAGNOSIS — I890 Lymphedema, not elsewhere classified: Secondary | ICD-10-CM | POA: Diagnosis not present

## 2022-04-10 DIAGNOSIS — E1042 Type 1 diabetes mellitus with diabetic polyneuropathy: Secondary | ICD-10-CM | POA: Diagnosis not present

## 2022-04-10 DIAGNOSIS — E10622 Type 1 diabetes mellitus with other skin ulcer: Secondary | ICD-10-CM | POA: Diagnosis not present

## 2022-04-10 DIAGNOSIS — L97212 Non-pressure chronic ulcer of right calf with fat layer exposed: Secondary | ICD-10-CM | POA: Diagnosis not present

## 2022-04-12 NOTE — Assessment & Plan Note (Signed)
Patient continues to require assistance with mobility around the home using her electric wheel chair to assist with preparing meals and using the toilet  ?She continues to work with home health PT   ? ?

## 2022-04-12 NOTE — Assessment & Plan Note (Signed)
Patient continues to have LE edema with weeping from blisters  ?Wound care home health visits patient regularly  ?She would benefit from electric wheel chair that helps with elevating her lower extremities  ?

## 2022-04-12 NOTE — Progress Notes (Signed)
[name] ([id number]) ?Visit Report for 04/10/2022 ?Arrival Information Details ?Patient Name: Date of Service: ?[name], [name] 04/10/2022 1:15 PM ?Medical Record Number: [id number] ?Patient Account Number: [id number] ?Date of Birth/Sex: Treating RN: ?[birth year] (71 y.o. F) ?Primary Care [place]: Stark Klein Other Clinician: ?Referring Dai Apel: ?Treating Riggin Cuttino/Extender: Fredirick Maudlin ?Stark Klein ?Weeks in Treatment: 1 ?Visit Information History Since Last Visit ?Added or deleted any medications: No ?Patient Arrived: Wheel Chair ?Any new allergies or adverse reactions: No ?Arrival Time: 13:13 ?Had a fall or experienced change in No ?Accompanied By: family ?activities of daily living that may affect ?Transfer Assistance: None ?risk of falls: ?Patient Requires Transmission-Based Precautions: No ?Signs or symptoms of abuse/neglect since last visito No ?Patient Has Alerts: Yes ?Hospitalized since last visit: No ?Patient Alerts: Patient on Blood Thinner ?Implantable device outside of the clinic excluding No ?pacemaker ?cellular tissue based products placed in the center ?since last visit: ?Has Dressing in Place as Prescribed: Yes ?Has Compression in Place as Prescribed: Yes ?Pain Present Now: Yes ?Electronic Signature(s) ?Signed: 04/12/2022 3:16:57 PM By: Sandre Kitty ?Entered By: Sandre Kitty on 04/10/2022 13:20:48 ?-------------------------------------------------------------------------------- ?Lower Extremity Assessment Details ?Patient Name: Date of Service: ?[name], [name] 04/10/2022 1:15 PM ?Medical Record Number: [id number] ?Patient Account Number: [id number] ?Date of Birth/Sex: Treating RN: ?April 10, 1951 (71 y.o. F) ?Primary Care Ande Therrell: Stark Klein Other Clinician: ?Referring Gracious Renken: ?Treating Jeanny Rymer/Extender: Fredirick Maudlin ?Stark Klein ?Weeks in Treatment: 1 ?Edema Assessment ?Assessed: [Left: No] [Right: No] ?[Left: Edema] [Right: :] ?Calf ?Left:  Right: ?Point of Measurement: From Medial Instep 35.5 cm 35.4 cm ?Ankle ?Left: Right: ?Point of Measurement: From Medial Instep 23.3 cm 23.3 cm ?Vascular Assessment ?Pulses: ?Dorsalis Pedis ?Palpable: [Left:Yes] [Right:Yes] ?Electronic Signature(s) ?Signed: 04/12/2022 3:16:57 PM By: Sandre Kitty ?Entered By: Sandre Kitty on 04/10/2022 13:42:26 ?-------------------------------------------------------------------------------- ?Multi Wound Chart Details ?Patient Name: ?Date of Service: ?[name], [name] 04/10/2022 1:15 PM ?Medical Record Number: [id number] ?Patient Account Number: [id number] ?Date of Birth/Sex: ?Treating RN: ?[birth year] (71 y.o. F) ?Primary Care Malayna Noori: Stark Klein ?Other Clinician: ?Referring Shahad Mazurek: ?Treating Sanjay Broadfoot/Extender: Fredirick Maudlin ?Stark Klein ?Weeks in Treatment: 1 ?Vital Signs ?Height(in): 67 ?Capillary Blood Glucose(mg/dl): 165 ?Weight(lbs): 243 ?Pulse(bpm): 53 ?Body Mass Index(BMI): 38.1 ?Blood Pressure(mmHg): 132/56 ?Temperature(??F): 98.5 ?Respiratory Rate(breaths/min): 22 ?Photos: [1:No Photos Right, Anterior Lower Leg] [2:No Photos Right, Posterior Ankle] [3:No Photos Left, Medial Lower Leg] ?Wound Location: [1:Gradually Appeared] [2:Gradually Appeared] [3:Gradually Appeared] ?Wounding Event: [1:Diabetic Wound/Ulcer of the Lower] [2:Diabetic Wound/Ulcer of the Lower] [3:Diabetic Wound/Ulcer of the Lower] ?Primary Etiology: [1:Extremity Cataracts, Anemia, Sleep Apnea,] [2:Extremity Cataracts, Anemia, Sleep Apnea,] [3:Extremity Cataracts, Anemia, Sleep Apnea,] ?Comorbid History: [1:Congestive Heart Failure, Hypertension, Type I Diabetes, Received Radiation 01/25/2022] [2:Congestive Heart Failure, Hypertension, Type I Diabetes, Received Radiation 01/25/2022] [3:Congestive Heart Failure, Hypertension, Type I  ?Diabetes, Received Radiation 01/25/2022] ?Date Acquired: [1:1] [2:1] [3:1] ?Weeks of Treatment: [1:Open] [2:Open] [3:Healed - Epithelialized] ?Wound Status:  [1:No] [2:No] [3:No] ?Wound Recurrence: [1:3.6x3.3x0.1] [2:2x1.5x0.2] [3:0x0x0] ?Measurements L x W x D (cm) [1:9.331] [2:2.356] [3:0] ?A (cm?) : ?rea [1:0.933] [2:0.471] [3:0] ?Volume (cm?) : [1:67.40%] [2:-66.60%] [3:100.00%] ?% Reduction in A [1:rea: 67.40%] [2:-66.40%] [3:100.00%] ?% Reduction in Volume: [1:Grade 1] [2:Grade 1] [3:Grade 1] ?Classification: [1:Medium] [2:Medium] [3:None Present] ?Exudate A mount: [1:Serosanguineous] [2:Serosanguineous] [3:N/A] ?Exudate Type: [1:red, brown] [2:red, brown] [3:N/A] ?Exudate Color: [1:Flat and Intact] [2:Distinct, outline attached] [3:Distinct, outline attached] ?Wound Margin: [1:Large (67-100%)] [2:Small (1-33%)] [3:None Present (0%)] ?Granulation A mount: [1:Red, Pink] [2:Red] [3:N/A] ?Granulation Quality: [1:Small (1-33%)] [2:Large (67-100%)] [3:None Present (0%)] ?  Necrotic A mount: ?[1:Fat Layer (Subcutaneous Tissue): Yes Fat Layer (Subcutaneous Tissue): Yes Fascia: No] ?Exposed Structures: ?[1:Fascia: No Tendon: No Muscle: No Joint: No Bone: No Small (1-33%)] [2:Fascia: No Tendon: No Muscle: No Joint: No Bone: No None] [3:Fat Layer (Subcutaneous Tissue): No Tendon: No Muscle: No Joint: No Bone: No Large (67-100%)] ?Epithelialization: [1:Debridement - Selective/Open Wound Debridement - Excisional] [3:N/A] ?Debridement: ?Pre-procedure Verification/Time Out 13:55 [2:13:55] [3:N/A] ?Taken: [1:Other] [2:Other] [3:N/A] ?Pain Control: [1:N/A] [2:Subcutaneous, Slough] [3:N/A] ?Tissue Debrided: [1:Non-Viable Tissue] [2:Skin/Subcutaneous Tissue] [3:N/A] ?Level: [1:11.88] [2:3] [3:N/A] ?Debridement A (sq cm): [1:rea Curette] [2:Curette] [3:N/A] ?Instrument: [1:Minimum] [2:Minimum] [3:N/A] ?Bleeding: [1:Pressure] [2:Pressure] [3:N/A] ?Hemostasis A chieved: [1:0] [2:5] [3:N/A] ?Procedural Pain: [1:0] [2:0] [3:N/A] ?Post Procedural Pain: [1:Procedure was tolerated well] [2:Procedure was tolerated well] [3:N/A] ?Debridement Treatment Response: [1:3.6x3.3x0.1] [2:2x1.5x0.2]  [3:N/A] ?Post Debridement Measurements L x ?W x D (cm) [1:0.933] [2:0.471] [3:N/A] ?Post Debridement Volume: (cm?) [1:Debridement] [2:Debridement] [3:N/A] ?Wound Number: '4 5 6 '$ ?Photos: No Photos No Photos No Photos ?Left, Proximal, Anterior Lower Leg Left, Distal, Anterior Lower Leg Left, Lateral Lower Leg ?Wound Location: ?Gradually Appeared Gradually Appeared Gradually Appeared ?Wounding Event: ?Diabetic Wound/Ulcer of the Lower Diabetic Wound/Ulcer of the Lower Diabetic Wound/Ulcer of the Lower ?Primary Etiology: ?Extremity Extremity Extremity ?Cataracts, Anemia, Sleep Apnea, Cataracts, Anemia, Sleep Apnea, Cataracts, Anemia, Sleep Apnea, ?Comorbid History: ?Congestive Heart Failure, Congestive Heart Failure, Congestive Heart Failure, ?Hypertension, Type I Diabetes, Hypertension, Type I Diabetes, Hypertension, Type I Diabetes, ?Received Radiation Received Radiation Received Radiation ?01/25/2022 01/25/2022 01/25/2022 ?Date A cquired: ?'1 1 1 '$ ?Weeks of Treatment: ?Healed - Epithelialized Healed - Epithelialized Healed - Epithelialized ?Wound Status: ?No No No ?Wound Recurrence: ?0x0x0 0x0x0 0x0x0 ?Measurements L x W x D (cm) ?0 0 0 ?A (cm?) : ?rea ?0 0 0 ?Volume (cm?) : ?100.00% 100.00% 100.00% ?% Reduction in A rea: ?100.00% 100.00% 100.00% ?% Reduction in Volume: ?Grade 1 Grade 1 Grade 1 ?Classification: ?None Present None Present None Present ?Exudate A mount: ?N/A N/A N/A ?Exudate Type: ?N/A N/A N/A ?Exudate Color: ?Distinct, outline attached Distinct, outline attached Distinct, outline attached ?Wound Margin: ?None Present (0%) None Present (0%) None Present (0%) ?Granulation A mount: ?N/A N/A N/A ?Granulation Quality: ?None Present (0%) None Present (0%) None Present (0%) ?Necrotic A mount: ?Fascia: No ?Fascia: No ?Fascia: No ?Exposed Structures: ?Fat Layer (Subcutaneous Tissue): No ?Fat Layer (Subcutaneous Tissue): No ?Fat Layer (Subcutaneous Tissue): No ?Tendon: No ?Tendon: No ?Tendon: No ?Muscle: No ?Muscle:  No ?Muscle: No ?Joint: No ?Joint: No ?Joint: No ?Bone: No ?Bone: No ?Bone: No ?Large (67-100%) Large (67-100%) Large (67-100%) ?Epithelialization: ?N/A N/A N/A ?Debridement: ?N/A N/A N/A ?Pain Control: ?N/A N/A N

## 2022-04-12 NOTE — Progress Notes (Signed)
Jillson, [name] ([id number]) ?Visit Report for 04/10/2022 ?Chief Complaint Document Details ?Patient Name: Date of Service: ?[name], [name] 04/10/2022 1:15 PM ?Medical Record Number: [id number] ?Patient Account Number: [id number] ?Date of Birth/Sex: Treating RN: ?02-14-1951 (71 y.o. F) ?Primary Care Provider: Stark Klein Other Clinician: ?Referring Provider: ?Treating Provider/Extender: Fredirick Maudlin ?Stark Klein ?Weeks in Treatment: 1 ?Information Obtained from: Patient ?Chief Complaint ?Patient presents for treatment of open ulcers due to venous insufficiency in the setting of poorly controlled DM1 and CHF ?Electronic Signature(s) ?Signed: 04/10/2022 2:18:42 PM By: Fredirick Maudlin MD FACS ?Entered By: Fredirick Maudlin on 04/10/2022 14:18:42 ?-------------------------------------------------------------------------------- ?Debridement Details ?Patient Name: Date of Service: ?[name], [name] 04/10/2022 1:15 PM ?Medical Record Number: [id number] ?Patient Account Number: [id number] ?Date of Birth/Sex: Treating RN: ?[birth year] (71 y.o. F) ?Primary Care Provider: Stark Klein Other Clinician: ?Referring Provider: ?Treating Provider/Extender: Fredirick Maudlin ?Stark Klein ?Weeks in Treatment: 1 ?Debridement Performed for Assessment: Wound #2 Right,Posterior Ankle ?Performed By: Physician Fredirick Maudlin, MD ?Debridement Type: Debridement ?Severity of Tissue Pre Debridement: Fat layer exposed ?Level of Consciousness (Pre-procedure): Awake and Alert ?Pre-procedure Verification/Time Out Yes - 13:55 ?Taken: ?Start Time: 13:55 ?Pain Control: ?Other : benzocaine 20% spray ?T Area Debrided (L x W): ?otal 2 (cm) x 1.5 (cm) = 3 (cm?) ?Tissue and other material debrided: Viable, Non-Viable, Slough, Subcutaneous, Slough ?Level: Skin/Subcutaneous Tissue ?Debridement Description: Excisional ?Instrument: Curette ?Bleeding: Minimum ?Hemostasis Achieved: Pressure ?Procedural Pain: 5 ?Post Procedural Pain:  0 ?Response to Treatment: Procedure was tolerated well ?Level of Consciousness (Post- Awake and Alert ?procedure): ?Post Debridement Measurements of Total Wound ?Length: (cm) 2 ?Width: (cm) 1.5 ?Depth: (cm) 0.2 ?Volume: (cm?) 0.471 ?Character of Wound/Ulcer Post Debridement: Improved ?Severity of Tissue Post Debridement: Fat layer exposed ?Post Procedure Diagnosis ?Same as Pre-procedure ?Electronic Signature(s) ?Signed: 04/10/2022 3:26:03 PM By: Fredirick Maudlin MD FACS ?Signed: 04/12/2022 3:16:57 PM By: Sandre Kitty ?Entered By: Sandre Kitty on 04/10/2022 13:58:47 ?-------------------------------------------------------------------------------- ?Debridement Details ?Patient Name: ?Date of Service: ?[name], [name] 04/10/2022 1:15 PM ?Medical Record Number: [id number] ?Patient Account Number: [id number] ?Date of Birth/Sex: ?Treating RN: ?[birth year] (71 y.o. F) ?Primary Care Provider: Stark Klein ?Other Clinician: ?Referring Provider: ?Treating Provider/Extender: Fredirick Maudlin ?Stark Klein ?Weeks in Treatment: 1 ?Debridement Performed for Assessment: Wound #1 Right,Anterior Lower Leg ?Performed By: Physician Fredirick Maudlin, MD ?Debridement Type: Debridement ?Severity of Tissue Pre Debridement: Fat layer exposed ?Level of Consciousness (Pre-procedure): Awake and Alert ?Pre-procedure Verification/Time Out Yes - 13:55 ?Taken: ?Start Time: 13:55 ?Pain Control: ?Other : benzocaine 20% spray ?T Area Debrided (L x W): ?otal 3.6 (cm) x 3.3 (cm) = 11.88 (cm?) ?Tissue and other material debrided: Non-Viable, Biofilm ?Level: Non-Viable Tissue ?Debridement Description: Selective/Open Wound ?Instrument: Curette ?Bleeding: Minimum ?Hemostasis Achieved: Pressure ?Procedural Pain: 0 ?Post Procedural Pain: 0 ?Response to Treatment: Procedure was tolerated well ?Level of Consciousness (Post- Awake and Alert ?procedure): ?Post Debridement Measurements of Total Wound ?Length: (cm) 3.6 ?Width: (cm) 3.3 ?Depth: (cm)  0.1 ?Volume: (cm?) 0.933 ?Character of Wound/Ulcer Post Debridement: Improved ?Severity of Tissue Post Debridement: Fat layer exposed ?Post Procedure Diagnosis ?Same as Pre-procedure ?Electronic Signature(s) ?Signed: 04/10/2022 3:26:03 PM By: Fredirick Maudlin MD FACS ?Signed: 04/12/2022 3:16:57 PM By: Sandre Kitty ?Entered By: Sandre Kitty on 04/10/2022 14:00:02 ?-------------------------------------------------------------------------------- ?HPI Details ?Patient Name: ?Date of Service: ?[name], [name] 04/10/2022 1:15 PM ?Medical Record Number: [id number] ?Patient Account Number: [id number] ?Date of Birth/Sex: Treating RN: ?1951-01-21 (71 y.o. F) ?Primary Care Provider: Stark Klein Other Clinician: ?Referring Provider: ?Treating Provider/Extender: Celine Ahr  Anderson Malta ?Stark Klein ?Weeks in Treatment: 1 ?History of Present Illness ?HPI Description: ADMISSION ?04/03/2022 ?This is a 71 year old woman with a past medical history notable for type 1 diabetes mellitus, poorly controlled with her most recent A1c being 8.3, congestive ?heart failure with recent admission for exacerbation secondary to noncompliance with diuretics, as well as history of pontine stroke and oxygen dependence. ?She has wounds on her bilateral lower extremities. She says they have been present for about 2 months and initially started as blisters. She says that she has ?compression stockings, but does not wear them because they are too difficult to get on. ABIs done in clinic were 1.4 and 1.5, suggesting some degree of ?noncompressibility. She also has a wound on her right heel that she says is secondary to it getting bumped frequently as she transfers. She came to clinic with ?just dry dressings on her legs. She does have home health care, as well as her sister that have been assisting her with her dressings. ?She has multiple wounds. On the left medial calf, just above her ankle, there is a somewhat geographic wound with slough  and some epithelialization. On the ?anterior tibial surface and lateral calf, the wounds are quite desiccated with yellow eschar present. On the right anterior tibial surface, the wounds are gritty with ?thin slough and eschar. The posterior right ankle wound is just above the calcaneus overlying the Achilles and has thick yellow-gray slough and a bit of odor. ?No purulent drainage appreciated. ?04/10/2022: The wounds on her left leg are closed. On the right, the anterior tibial surface wounds are epithelializing and much cleaner. There is just a layer of ?biofilm. On the open areas. On the posterior right ankle wound, there is a decreased volume of slough and no odor. We have been using Hydrofera Blue to all ?sites under compression. ?Electronic Signature(s) ?Signed: 04/10/2022 2:21:06 PM By: Fredirick Maudlin MD FACS ?Entered By: Fredirick Maudlin on 04/10/2022 14:21:06 ?-------------------------------------------------------------------------------- ?Physical Exam Details ?Patient Name: Date of Service: ?[name], [name] 04/10/2022 1:15 PM ?Medical Record Number: [id number] ?Patient Account Number: [id number] ?Date of Birth/Sex: Treating RN: ?[birth year] (71 y.o. F) ?Primary Care Provider: Stark Klein Other Clinician: ?Referring Provider: ?Treating Provider/Extender: Fredirick Maudlin ?Stark Klein ?Weeks in Treatment: 1 ?Constitutional ?. Bradycardic, asymptomatic.. . . No acute distress. ?Respiratory ?Normal work of breathing on supplemental oxygen. ?Notes ?04/10/2022: The wounds on her left leg are closed. On the right, the anterior tibial surface wounds are epithelializing and much cleaner. There is just a layer of ?biofilm. On the open areas. On the posterior right ankle wound, there is a decreased volume of slough and no odor. ?Electronic Signature(s) ?Signed: 04/10/2022 2:22:45 PM By: Fredirick Maudlin MD FACS ?Entered By: Fredirick Maudlin on 04/10/2022  14:22:45 ?-------------------------------------------------------------------------------- ?Physician Orders Details ?Patient Name: Date of Service: ?[name], [name] 04/10/2022 1:15 PM ?Medical Record Number: [id number] ?Patient Account Number: [id number]

## 2022-04-17 ENCOUNTER — Telehealth: Payer: Self-pay | Admitting: *Deleted

## 2022-04-17 ENCOUNTER — Encounter (HOSPITAL_BASED_OUTPATIENT_CLINIC_OR_DEPARTMENT_OTHER): Payer: Medicare PPO | Admitting: General Surgery

## 2022-04-17 DIAGNOSIS — I890 Lymphedema, not elsewhere classified: Secondary | ICD-10-CM | POA: Diagnosis not present

## 2022-04-17 DIAGNOSIS — E10622 Type 1 diabetes mellitus with other skin ulcer: Secondary | ICD-10-CM | POA: Diagnosis not present

## 2022-04-17 DIAGNOSIS — I5032 Chronic diastolic (congestive) heart failure: Secondary | ICD-10-CM | POA: Diagnosis not present

## 2022-04-17 DIAGNOSIS — Z6841 Body Mass Index (BMI) 40.0 and over, adult: Secondary | ICD-10-CM | POA: Diagnosis not present

## 2022-04-17 DIAGNOSIS — E1059 Type 1 diabetes mellitus with other circulatory complications: Secondary | ICD-10-CM | POA: Diagnosis not present

## 2022-04-17 DIAGNOSIS — L97212 Non-pressure chronic ulcer of right calf with fat layer exposed: Secondary | ICD-10-CM | POA: Diagnosis not present

## 2022-04-17 DIAGNOSIS — E1042 Type 1 diabetes mellitus with diabetic polyneuropathy: Secondary | ICD-10-CM | POA: Diagnosis not present

## 2022-04-17 DIAGNOSIS — E11622 Type 2 diabetes mellitus with other skin ulcer: Secondary | ICD-10-CM | POA: Diagnosis not present

## 2022-04-17 DIAGNOSIS — L97312 Non-pressure chronic ulcer of right ankle with fat layer exposed: Secondary | ICD-10-CM | POA: Diagnosis not present

## 2022-04-17 NOTE — Chronic Care Management (AMB) (Signed)
?  Care Management  ? ?Note ? ?04/17/2022 ?Name: Wajiha Versteeg MRN: [id number] DOB: 07-23-1951 ? ?[name] Bri Wakeman is a 71 y.o. year old female who is a primary care patient of [name], [name], MD and is actively engaged with the care management team. I reached out to [name] by phone today to assist with re-scheduling an initial visit with the RN Case Manager ? ?Follow up plan: ?Patient declines further follow up and engagement by the care management team. Appropriate care team members and provider have been notified via electronic communication.  The care management team is available to follow up with the patient after provider conversation with the patient regarding recommendation for care management engagement and subsequent re-referral to the care management team.  ? ?[name]  ?Care Guide, Embedded Care Coordination ?[facility]  ?Direct Dial: [phone] ? ?

## 2022-04-17 NOTE — Progress Notes (Signed)
[name] ([id number]) ?Visit Report for 04/17/2022 ?Chief Complaint Document Details ?Patient Name: Date of Service: ?[name], [name] 04/17/2022 2:00 PM ?Medical Record Number: [id number] ?Patient Account Number: [id number] ?Date of Birth/Sex: Treating RN: ?10/26/1951 (71 y.o. F) ?Primary Care Provider: Stark Klein Other Clinician: ?Referring Provider: ?Treating Provider/Extender: Fredirick Maudlin ?Stark Klein ?Weeks in Treatment: 2 ?Information Obtained from: Patient ?Chief Complaint ?Patient presents for treatment of open ulcers due to venous insufficiency in the setting of poorly controlled DM1 and CHF ?Electronic Signature(s) ?Signed: 04/17/2022 2:47:48 PM By: Fredirick Maudlin MD FACS ?Entered By: Fredirick Maudlin on 04/17/2022 14:47:48 ?-------------------------------------------------------------------------------- ?Debridement Details ?Patient Name: Date of Service: ?[name], [name] 04/17/2022 2:00 PM ?Medical Record Number: [id number] ?Patient Account Number: [id number] ?Date of Birth/Sex: Treating RN: ?01/04/1951 (71 y.o. [name] ?Primary Care Provider: Stark Klein Other Clinician: ?Referring Provider: ?Treating Provider/Extender: Fredirick Maudlin ?Stark Klein ?Weeks in Treatment: 2 ?Debridement Performed for Assessment: Wound #2 Right,Posterior Ankle ?Performed By: Physician Fredirick Maudlin, MD ?Debridement Type: Debridement ?Severity of Tissue Pre Debridement: Fat layer exposed ?Level of Consciousness (Pre-procedure): Awake and Alert ?Pre-procedure Verification/Time Out Yes - 14:40 ?Taken: ?Start Time: 14:40 ?Pain Control: Lidocaine 4% T opical Solution ?T Area Debrided (L x W): ?otal 2 (cm) x 1.4 (cm) = 2.8 (cm?) ?Tissue and other material debrided: Viable, Non-Viable, Slough, Subcutaneous, Slough ?Level: Skin/Subcutaneous Tissue ?Debridement Description: Excisional ?Instrument: Curette ?Bleeding: Minimum ?Hemostasis Achieved: Pressure ?Procedural Pain:  0 ?Post Procedural Pain: 0 ?Response to Treatment: Procedure was tolerated well ?Level of Consciousness (Post- Awake and Alert ?procedure): ?Post Debridement Measurements of Total Wound ?Length: (cm) 2 ?Width: (cm) 1.4 ?Depth: (cm) 0.1 ?Volume: (cm?) 0.22 ?Character of Wound/Ulcer Post Debridement: Improved ?Severity of Tissue Post Debridement: Fat layer exposed ?Post Procedure Diagnosis ?Same as Pre-procedure ?Electronic Signature(s) ?Signed: 04/17/2022 3:54:45 PM By: Fredirick Maudlin MD FACS ?Signed: 04/17/2022 4:50:41 PM By: Adline Peals ?Entered By: Adline Peals on 04/17/2022 14:43:12 ?-------------------------------------------------------------------------------- ?HPI Details ?Patient Name: Date of Service: ?[name], [name] 04/17/2022 2:00 PM ?Medical Record Number: [id number] ?Patient Account Number: [id number] ?Date of Birth/Sex: Treating RN: ?Jan 07, 1951 (71 y.o. F) ?Primary Care Provider: Stark Klein Other Clinician: ?Referring Provider: ?Treating Provider/Extender: Fredirick Maudlin ?Stark Klein ?Weeks in Treatment: 2 ?History of Present Illness ?HPI Description: ADMISSION ?04/03/2022 ?This is a 71 year old woman with a past medical history notable for type 1 diabetes mellitus, poorly controlled with her most recent A1c being 8.3, congestive ?heart failure with recent admission for exacerbation secondary to noncompliance with diuretics, as well as history of pontine stroke and oxygen dependence. ?She has wounds on her bilateral lower extremities. She says they have been present for about 2 months and initially started as blisters. She says that she has ?compression stockings, but does not wear them because they are too difficult to get on. ABIs done in clinic were 1.4 and 1.5, suggesting some degree of ?noncompressibility. She also has a wound on her right heel that she says is secondary to it getting bumped frequently as she transfers. She came to clinic with ?just dry dressings on her  legs. She does have home health care, as well as her sister that have been assisting her with her dressings. ?She has multiple wounds. On the left medial calf, just above her ankle, there is a somewhat geographic wound with slough and some epithelialization. On the ?anterior tibial surface and lateral calf, the wounds are quite desiccated with yellow eschar present. On the right anterior tibial surface, the wounds are  gritty with ?thin slough and eschar. The posterior right ankle wound is just above the calcaneus overlying the Achilles and has thick yellow-gray slough and a bit of odor. ?No purulent drainage appreciated. ?04/10/2022: The wounds on her left leg are closed. On the right, the anterior tibial surface wounds are epithelializing and much cleaner. There is just a layer of ?biofilm on the open areas. On the posterior right ankle wound, there is a decreased volume of slough and no odor. We have been using Hydrofera Blue to all ?sites under compression. ?04/17/2022: The wounds on her left leg remained closed and she did bring her juxta lite stockings with her today. On the right, the more distal wound is closed ?and the proximal wound has epithelialized significantly since her last visit. At the posterior right ankle, she still has accumulated some slough and some ?periwound callus but there is no odor or significant drainage. ?Electronic Signature(s) ?Signed: 04/17/2022 2:48:43 PM By: Fredirick Maudlin MD FACS ?Entered By: Fredirick Maudlin on 04/17/2022 14:48:42 ?-------------------------------------------------------------------------------- ?Physical Exam Details ?Patient Name: Date of Service: ?[name], [name] 04/17/2022 2:00 PM ?Medical Record Number: [id number] ?Patient Account Number: [id number] ?Date of Birth/Sex: Treating RN: ?1951/02/11 (71 y.o. F) ?Primary Care Provider: Stark Klein Other Clinician: ?Referring Provider: ?Treating Provider/Extender: Fredirick Maudlin ?Stark Klein ?Weeks in  Treatment: 2 ?Constitutional ?. . . . No acute distress. ?Respiratory ?Normal work of breathing on room air. ?Notes ?04/17/2022: There are just 2 open wounds remaining. The more proximal anterior tibial wound has epithelialized significantly since her last visit. The posterior ?ankle wound has some accumulated slough and callus, but there is no odor or purulent drainage. ?Electronic Signature(s) ?Signed: 04/17/2022 2:50:11 PM By: Fredirick Maudlin MD FACS ?Entered By: Fredirick Maudlin on 04/17/2022 14:50:11 ?-------------------------------------------------------------------------------- ?Physician Orders Details ?Patient Name: Date of Service: ?[name], [name] 04/17/2022 2:00 PM ?Medical Record Number: [id number] ?Patient Account Number: [id number] ?Date of Birth/Sex: Treating RN: ?04-Jul-1951 (71 y.o. 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 - Dr. Celine Ahr Room 1 ?Return A ?Bathing/ Shower/ Hygiene ?May shower with protection but do not get wound dressing(s) wet. ?Edema Control - Lymphedema / SCD / Other ?Bilateral Lower Extremities ?Elevate legs to the level of the heart or above for 30 minutes daily and/or when sitting, a frequency of: - throughout the day ?Exercise  regularly ?Home Health ?New wound care orders this week; continue Home Health for wound care. May utilize formulary equivalent dressing for wound treatment ?orders unless otherwise specified. - hold wound care this we

## 2022-04-18 ENCOUNTER — Telehealth: Payer: Self-pay | Admitting: Family Medicine

## 2022-04-18 NOTE — Telephone Encounter (Signed)
Patient is calling to check on status of order being faxed back to wound care concerning her rehabilitation wheel chair. I did not see form in outgoing faxes or in Dr. Rosita Fire Robinson's box.  ? ?She said we should have received form 2 weeks ago.  ? ?Please call patient and update her on status of form. The best call back is [phone] ?

## 2022-04-20 ENCOUNTER — Other Ambulatory Visit: Payer: Self-pay | Admitting: Adult Health

## 2022-04-20 DIAGNOSIS — W19XXXS Unspecified fall, sequela: Secondary | ICD-10-CM

## 2022-04-20 NOTE — Telephone Encounter (Signed)
?  Form has been completed for my portion. Will have Dr. Gwendlyn Deutscher complete the second portion and fax to the appropriate number.  ? ?Eulis Foster, MD ?[place], PGY-3 ?[phone]  ? ?

## 2022-04-20 NOTE — Telephone Encounter (Signed)
Patient is calling to check and see if we received the new copy of the order for her wheelchair. We received it this morning 04/20/22 and it was placed in Dr. Rosita Fire Robinson's box.  ? ?Please call patient and let her know when the form is completed. [phone] ?

## 2022-04-21 NOTE — Progress Notes (Signed)
[name], [name] ([id number]) ?Visit Report for 04/17/2022 ?Arrival Information Details ?Patient Name: Date of Service: ?[name], [name] 04/17/2022 2:00 PM ?Medical Record Number: [id number] ?Patient Account Number: [id number] ?Date of Birth/Sex: Treating RN: ?[birth year] (71 y.o. [name] ?Primary Care [place] Zaragoza: Stark Klein Other Clinician: ?Referring Ashlei Chinchilla: ?Treating Saifan Rayford/Extender: Fredirick Maudlin ?Stark Klein ?Weeks in Treatment: 2 ?Visit Information History Since Last Visit ?Added or deleted any medications: No ?Patient Arrived: Wheel Chair ?Any new allergies or adverse reactions: No ?Arrival Time: 14:09 ?Had a fall or experienced change in No ?Accompanied By: family ?activities of daily living that may affect ?Transfer Assistance: Manual ?risk of falls: ?Patient Identification Verified: Yes ?Signs or symptoms of abuse/neglect since last visito No ?Secondary Verification Process Completed: Yes ?Hospitalized since last visit: No ?Patient Requires Transmission-Based Precautions: No ?Implantable device outside of the clinic excluding No ?Patient Has Alerts: Yes ?cellular tissue based products placed in the center ?Patient Alerts: Patient on Blood Thinner since last visit: ?pacemaker Has Dressing in Place as Prescribed: Yes ?Has Compression in Place as Prescribed: Yes ?Pain Present Now: No ?Electronic Signature(s) ?Signed: 04/17/2022 4:50:41 PM By: Adline Peals ?Entered By: Adline Peals on 04/17/2022 14:15:19 ?-------------------------------------------------------------------------------- ?Encounter Discharge Information Details ?Patient Name: Date of Service: ?[name], [name] 04/17/2022 2:00 PM ?Medical Record Number: [id number] ?Patient Account Number: [id number] ?Date of Birth/Sex: Treating RN: ?1951/04/17 (71 y.o. [name] ?Primary Care Deoni Cosey: Stark Klein Other Clinician: ?Referring Rayjon Wery: ?Treating Kairen Hallinan/Extender: Fredirick Maudlin ?Stark Klein ?Weeks in Treatment: 2 ?Encounter Discharge Information Items Post Procedure Vitals ?Discharge Condition: Stable ?Temperature (F): 97.8 ?Ambulatory Status: Wheelchair ?Pulse (bpm): 60 ?Discharge Destination: Home ?Respiratory Rate (breaths/min): 22 ?Transportation: Private Auto ?Blood Pressure (mmHg): 137/66 ?Accompanied By: family ?Schedule Follow-up Appointment: Yes ?Clinical Summary of Care: Patient Declined ?Electronic Signature(s) ?Signed: 04/17/2022 4:50:41 PM By: Adline Peals ?Entered By: Adline Peals on 04/17/2022 15:14:39 ?-------------------------------------------------------------------------------- ?Lower Extremity Assessment Details ?Patient Name: ?Date of Service: ?[name], [name] 04/17/2022 2:00 PM ?Medical Record Number: [id number] ?Patient Account Number: [id number] ?Date of Birth/Sex: ?Treating RN: ?05-Oct-1951 (71 y.o. [name] ?Primary Care Akaylah Lalley: Stark Klein ?Other Clinician: ?Referring Brysyn Brandenberger: ?Treating Erianna Jolly/Extender: Fredirick Maudlin ?Stark Klein ?Weeks in Treatment: 2 ?Edema Assessment ?Assessed: [Left: No] [Right: No] ?E[Left: dema] [Right: :] ?Calf ?Left: Right: ?Point of Measurement: From Medial Instep 39.1 cm ?Ankle ?Left: Right: ?Point of Measurement: From Medial Instep 22.2 cm ?Vascular Assessment ?Pulses: ?Dorsalis Pedis ?Palpable: [Right:Yes] ?Electronic Signature(s) ?Signed: 04/17/2022 4:50:41 PM By: Adline Peals ?Entered By: Adline Peals on 04/17/2022 14:26:24 ?-------------------------------------------------------------------------------- ?Multi Wound Chart Details ?Patient Name: ?Date of Service: ?[name], [name] 04/17/2022 2:00 PM ?Medical Record Number: [id number] ?Patient Account Number: [id number] ?Date of Birth/Sex: ?Treating RN: ?[birth year] (71 y.o. F) ?Primary Care Mattisyn Cardona: Stark Klein ?Other Clinician: ?Referring Walaa Carel: ?Treating Srinika Delone/Extender: Fredirick Maudlin ?Stark Klein ?Weeks in Treatment: 2 ?Vital Signs ?Height(in): 67 ?Capillary Blood Glucose(mg/dl): 240 ?Weight(lbs): 243 ?Pulse(bpm): 60 ?Body Mass Index(BMI): 38.1 ?Blood Pressure(mmHg): 137/66 ?Temperature(??F): 97.8 ?Respiratory Rate(breaths/min): 22 ?Photos: ?Right, Anterior Lower Leg Right, Posterior Ankle Right, Distal, Anterior Lower Leg ?Wound Location: ?Gradually Appeared Gradually Appeared Blister ?Wounding Event: ?Diabetic Wound/Ulcer of the Lower Diabetic Wound/Ulcer of the Lower Diabetic Wound/Ulcer of the Lower ?Primary Etiology: ?Extremity Extremity Extremity ?Cataracts, Anemia, Sleep Apnea, Cataracts, Anemia, Sleep Apnea, Cataracts, Anemia, Sleep Apnea, ?Comorbid History: ?Congestive Heart Failure, Congestive Heart Failure, Congestive Heart Failure, ?Hypertension, Type I Diabetes, Hypertension, Type I Diabetes, Hypertension, Type I Diabetes, ?Received Radiation Received Radiation Received Radiation ?01/25/2022 01/25/2022  04/10/2022 ?Date Acquired: ?'2 2 1 '$ ?Weeks of Treatment: ?Open Open Healed - Epithelialized ?Wound Status: ?No No No ?Wound Recurrence: ?2.2x1.5x0.1 2x1.4x0.1 0x0x0 ?Measurements L x W x D (cm) ?2.592 2.199 0 ?A (cm?) : ?rea ?0.259 0.22 0 ?Volume (cm?) : ?90.90% -55.50% 100.00% ?% Reduction in A rea: ?90.90% 22.30% 100.00% ?% Reduction in Volume: ?Grade 1 Grade 1 Grade 1 ?Classification: ?Medium Medium None Present ?Exudate A mount: ?Serosanguineous Serosanguineous N/A ?Exudate Type: ?red, brown red, brown N/A ?Exudate Color: ?Flat and Intact Distinct, outline attached Distinct, outline attached ?Wound Margin: ?Large (67-100%) Medium (34-66%) None Present (0%) ?Granulation A mount: ?Pink Red N/A ?Granulation Quality: ?Small (1-33%) Medium (34-66%) None Present (0%) ?Necrotic A mount: ?Fat Layer (Subcutaneous Tissue): Yes Fat Layer (Subcutaneous Tissue): Yes Fascia: No ?Exposed Structures: ?Fascia: No ?Fascia: No ?Fat Layer (Subcutaneous Tissue): No ?Tendon: No ?Tendon: No ?Tendon: No ?Muscle:  No ?Muscle: No ?Muscle: No ?Joint: No ?Joint: No ?Joint: No ?Bone: No ?Bone: No ?Bone: No ?Medium (34-66%) Small (1-33%) Large (67-100%) ?Epithelialization: ?N/A Debridement - Excisional N/A ?Debridement: ?Pre-procedure Verification/Time Out N/A 14:40 N/A ?Taken: ?N/A Lidocaine 4% Topical Solution N/A ?Pain Control: ?N/A Subcutaneous, Slough N/A ?Tissue Debrided: ?N/A Skin/Subcutaneous Tissue N/A ?Level: ?N/A 2.8 N/A ?Debridement A (sq cm): ?rea ?N/A Curette N/A ?Instrument: ?N/A Minimum N/A ?Bleeding: ?N/A Pressure N/A ?Hemostasis A chieved: ?N/A 0 N/A ?Procedural Pain: ?N/A 0 N/A ?Post Procedural Pain: ?N/A Procedure was tolerated well N/A ?Debridement Treatment Response: ?N/A 2x1.4x0.1 N/A ?Post Debridement Measurements L x ?W x D (cm) ?N/A 0.22 N/A ?Post Debridement Volume: (cm?) ?N/A Debridement N/A ?Procedures Performed: ?Treatment Notes ?Electronic Signature(s) ?Signed: 04/17/2022 2:47:20 PM By: Fredirick Maudlin MD FACS ?Entered By: Fredirick Maudlin on 04/17/2022 14:47:20 ?-------------------------------------------------------------------------------- ?Multi-Disciplinary Care Plan Details ?Patient Name: ?Date of Service: ?[name], [name] 04/17/2022 2:00 PM ?Medical Record Number: [id number] ?Patient Account Number: [id number] ?Date of Birth/Sex: ?Treating RN: ?December 23, 1951 (71 y.o. [name] ?Primary Care Flynn Gwyn: Stark Klein ?Other Clinician: ?Referring Rebeca Valdivia: ?Treating Zakyia Gagan/Extender: Fredirick Maudlin ?Stark Klein ?Weeks in Treatment: 2 ?Multidisciplinary Care Plan reviewed with physician ?Active Inactive ?Abuse / Safety / Falls / Self Care Management ?Nursing Diagnoses: ?History of Falls ?Impaired physical mobility ?Goals: ?Patient/caregiver will verbalize/demonstrate measures taken to prevent injury and/or falls ?Date Initiated: 04/03/2022 ?Target Resolution Date: 05/08/2022 ?Goal Status: Active ?Interventions: ?Assess fall risk on admission and as needed ?Assess: immobility,  friction, shearing, incontinence upon admission and as needed ?Assess impairment of mobility on admission and as needed per policy ?Notes: ?Nutrition ?Nursing Diagnoses: ?Impaired glucose control: actual or potential ?Poten

## 2022-04-24 ENCOUNTER — Encounter (HOSPITAL_BASED_OUTPATIENT_CLINIC_OR_DEPARTMENT_OTHER): Payer: Medicare PPO | Attending: General Surgery | Admitting: General Surgery

## 2022-04-24 DIAGNOSIS — I110 Hypertensive heart disease with heart failure: Secondary | ICD-10-CM | POA: Insufficient documentation

## 2022-04-24 DIAGNOSIS — L97821 Non-pressure chronic ulcer of other part of left lower leg limited to breakdown of skin: Secondary | ICD-10-CM | POA: Insufficient documentation

## 2022-04-24 DIAGNOSIS — E10622 Type 1 diabetes mellitus with other skin ulcer: Secondary | ICD-10-CM | POA: Diagnosis not present

## 2022-04-24 DIAGNOSIS — E1042 Type 1 diabetes mellitus with diabetic polyneuropathy: Secondary | ICD-10-CM | POA: Diagnosis not present

## 2022-04-24 DIAGNOSIS — I872 Venous insufficiency (chronic) (peripheral): Secondary | ICD-10-CM | POA: Insufficient documentation

## 2022-04-24 DIAGNOSIS — I890 Lymphedema, not elsewhere classified: Secondary | ICD-10-CM | POA: Diagnosis not present

## 2022-04-24 DIAGNOSIS — E1065 Type 1 diabetes mellitus with hyperglycemia: Secondary | ICD-10-CM | POA: Insufficient documentation

## 2022-04-24 DIAGNOSIS — Z8673 Personal history of transient ischemic attack (TIA), and cerebral infarction without residual deficits: Secondary | ICD-10-CM | POA: Diagnosis not present

## 2022-04-24 DIAGNOSIS — L97212 Non-pressure chronic ulcer of right calf with fat layer exposed: Secondary | ICD-10-CM | POA: Diagnosis not present

## 2022-04-24 DIAGNOSIS — I5032 Chronic diastolic (congestive) heart failure: Secondary | ICD-10-CM | POA: Diagnosis not present

## 2022-04-24 DIAGNOSIS — E1059 Type 1 diabetes mellitus with other circulatory complications: Secondary | ICD-10-CM | POA: Diagnosis not present

## 2022-04-24 DIAGNOSIS — L97312 Non-pressure chronic ulcer of right ankle with fat layer exposed: Secondary | ICD-10-CM | POA: Diagnosis not present

## 2022-04-24 DIAGNOSIS — Z9981 Dependence on supplemental oxygen: Secondary | ICD-10-CM | POA: Diagnosis not present

## 2022-04-24 DIAGNOSIS — E10621 Type 1 diabetes mellitus with foot ulcer: Secondary | ICD-10-CM | POA: Diagnosis not present

## 2022-04-24 DIAGNOSIS — L97412 Non-pressure chronic ulcer of right heel and midfoot with fat layer exposed: Secondary | ICD-10-CM | POA: Diagnosis not present

## 2022-04-24 DIAGNOSIS — L97811 Non-pressure chronic ulcer of other part of right lower leg limited to breakdown of skin: Secondary | ICD-10-CM | POA: Diagnosis not present

## 2022-04-24 LAB — GLUCOSE, CAPILLARY: Glucose-Capillary: 202 mg/dL — ABNORMAL HIGH (ref 70–99)

## 2022-04-24 NOTE — Progress Notes (Signed)
Remote pacemaker transmission.   

## 2022-04-24 NOTE — Progress Notes (Incomplete)
?Cardiology Office Note:   ? ?Date:  04/24/2022  ? ?ID:  [name], DOB 1951/01/23, MRN [id number] ? ?PCP:  Eulis Foster, MD  ?Cardiologist:  Candee Furbish, MD  ?Electrophysiologist:  Will Meredith Leeds, MD  ? ?Referring MD: Donnal Moat*  ? ? ? ?History of Present Illness:   ? ?[name] is a 71 y.o. female here for follow-up of implantable loop recorder placed on 12/23/2018 in the setting of cryptogenic stroke.  Her sensitivity levels have been decreased because of false positive atrial fibrillation. ? ?She has diabetes with hypertension, hyperlipidemia, thyroid cancer status post thyroidectomy and prior stroke in 2016 as well as acute stroke on 10/13/2018.  She was found to have frequent atrial ectopy on telemetry during hospitalization but this was thought not to be atrial fibrillation.  No palpitations. ? ?TEE previously was unremarkable.  Had some chest discomfort in the emergency department on 10/25/2018.  No atrial fibrillation.  GERD. ? ?Normal coronary arteries in 2013. ? ?Gabapentin hot flashes. No chest pain. Recent falls.  ? ?Denies any fevers chills nausea vomiting syncope. ? ?Past Medical History:  ?Diagnosis Date  ? Abnormal echocardiogram   ? Anemia   ? Arthritis   ? BPPV (benign paroxysmal positional vertigo), right 06/13/2017  ? Bradycardia 03/07/2020  ? Cerebrovascular accident (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 ([place]) 10/29/2015  ? CHB (complete heart block) ([place]) 03/08/2020  ? CHF (congestive heart failure) ([place])   ? Closed fracture of left proximal humerus 07/08/2019  ? Decreased range of motion of left shoulder 09/03/2020  ? Depression   ? Dizzy 03/07/2020  ? bradycardia  ? Edentulous 09/03/2020  ? GERD (gastroesophageal reflux disease)   ? Hemiparesis affecting left side as late effect of cerebrovascular accident (CVA) ([place]) 03/08/2020  ? Hemorrhoids   ? History of Falls with injury 03/08/2020  ? Fall resulting in left humeral  fracture  ? Hyperlipidemia   ? Hypertension   ? Hypothyroidism   ? Intracranial vascular stenosis   ? Lacunar infarction [facility])   ? Brain MRI 07/2021 multiple small remote pontine lacunar infarcts  ? Near syncope 03/08/2020  ? Paroxysmal atrial fibrillation (H[facility])   ? Presence of permanent cardiac pacemaker   ? Proteinuria 03/08/2020  ? Seasonal allergies   ? Stroke [facility])   ? Thyroid cancer ([place])   ? per pt S/p Total Thyroidectomy with Radioactive Iodine Therapy  ? Type 1 diabetes mellitus ([place])   ? ? ?Past Surgical History:  ?Procedure Laterality Date  ? ABDOMINAL ANGIOGRAM N/A 05/14/2012  ? Procedure: ABDOMINAL ANGIOGRAM;  Surgeon: Laverda Page, MD;  Location: [facility] CATH LAB;  Service: Cardiovascular;  Laterality: N/A;  ? ABDOMINAL HYSTERECTOMY  1995  ? partial  ? ANKLE FRACTURE SURGERY Right   ? CARDIAC CATHETERIZATION    ? with coronary angiogram  ? cataract  Bilateral 2018  ? COLONOSCOPY N/A 09/09/2014  ? Procedure: COLONOSCOPY;  Surgeon: Gatha Mayer, MD;  Location: [place];  Service: Endoscopy;  Laterality: N/A;  ? ESOPHAGOGASTRODUODENOSCOPY (EGD) WITH PROPOFOL N/A 12/03/2018  ? Procedure: ESOPHAGOGASTRODUODENOSCOPY (EGD) WITH PROPOFOL;  Surgeon: Doran Stabler, MD;  Location: [place];  Service: Gastroenterology;  Laterality: N/A;  ? LEFT AND RIGHT HEART CATHETERIZATION WITH CORONARY ANGIOGRAM N/A 05/14/2012  ? Procedure: LEFT AND RIGHT HEART CATHETERIZATION WITH CORONARY ANGIOGRAM;  Surgeon: Laverda Page, MD;  Location: [facility] CATH LAB;  Service: Cardiovascular;  Laterality: N/A;  ? LOOP RECORDER INSERTION N/A 12/23/2018  ?  Procedure: LOOP RECORDER INSERTION;  Surgeon: Constance Haw, MD;  Location: [place] CV LAB;  Service: Cardiovascular;  Laterality: N/A;  ? LOOP RECORDER REMOVAL N/A 06/25/2020  ? Procedure: LOOP RECORDER REMOVAL;  Surgeon: Constance Haw, MD;  Location: [place] CV LAB;  Service: Cardiovascular;  Laterality: N/A;  ? MINI METER   09/08/2014  ?  ELECTRONIC INSULIN PUMP  ? PACEMAKER IMPLANT N/A 06/25/2020  ? Procedure: PACEMAKER IMPLANT;  Surgeon: Constance Haw, MD;  Location: [place] CV LAB;  Service: Cardiovascular;  Laterality: N/A;  ? TEE WITHOUT CARDIOVERSION N/A 10/16/2018  ? Procedure: TRANSESOPHAGEAL ECHOCARDIOGRAM (TEE);  Surgeon: Buford Dresser, MD;  Location: [facility] ENDOSCOPY;  Service: Cardiovascular;  Laterality: N/A;  ? THYROIDECTOMY  2006  ? for thyroid cancer  ? ? ?Current Medications: ?No outpatient medications have been marked as taking for the 04/25/22 encounter (Appointment) with Jerline Pain, MD.  ?  ? ?Allergies:   Latex  ? ?Social History  ? ?Socioeconomic History  ? Marital status: Divorced  ?  Spouse name: Not on file  ? Number of children: 1  ? Years of education: Not on file  ? Highest education level: Not on file  ?Occupational History  ? Occupation: retired Pharmacist, hospital  ?Tobacco Use  ? Smoking status: Never  ? Smokeless tobacco: Never  ?Vaping Use  ? Vaping Use: Never used  ?Substance and Sexual Activity  ? Alcohol use: No  ? Drug use: No  ? Sexual activity: Not on file  ?Other Topics Concern  ? Not on file  ?Social History Narrative  ? ** Merged History Encounter **  ?    ? 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 One son, helping to care for her granddaughter. No caffeine. Updated 07/20/2014.  ? ?Social Determinants of Health  ? ?Financial Resource Strain: Not on file  ?Food Insecurity: No Food Insecurity  ? Worried About Charity fundraiser in the Last Year: Never true  ? Ran Out of Food in the Last Year: Never true  ?Transportation Needs: Not on file  ?Physical Activity: Not on file  ?Stress: Not on file  ?Social Connections: Not on file  ?  ? ?Family History: ?The patient's family history includes Diabetes in her maternal aunt; Early death in her father; Heart attack in her maternal grandfather and mother; Heart disease in her maternal aunt, maternal grandfather, and mother; Heart failure in her maternal  grandfather; Hyperlipidemia in her father and mother; Hypertension in her father and mother; Prostate cancer in her paternal uncle; Renal Disease in her mother; Stroke in her son. There is no history of Breast cancer. ? ?ROS:   ?Please see the history of present illness.    ? All other systems reviewed and are negative. ? ?EKGs/Labs/Other Studies Reviewed:   ? ?The following studies were reviewed today: ? ?Cardiac event monitor 10/28/2018- 1 episode of 5 beats nonsustained ventricular tachycardia no atrial fibrillation occasional PACs ? ?TTE 10/14/18  ?Review of the above records today demonstrates:  ?- Left ventricle: The cavity size was normal. There was moderate ?  concentric hypertrophy. Systolic function was normal. The ?  estimated ejection fraction was in the range of 60% to 65%. Wall ?  motion was normal; there were no regional wall motion ?  abnormalities. The study is not technically sufficient to allow ?  evaluation of LV diastolic function. ?- Left atrium: The atrium was normal in size. ?- Right atrium: The atrium was normal in size. ? ?  EKG:  EKG is not ordered today.  Previously sinus with PACs ? ?Recent Labs: ?02/10/2022: B Natriuretic Peptide 289.5 ?02/11/2022: ALT 10; Magnesium 1.7; TSH 3.228 ?03/02/2022: BUN 13; Creatinine, Ser 1.05; Hemoglobin 11.0; Platelets 185; Potassium 3.7; Sodium 145  ?Recent Lipid Panel ?   ?Component Value Date/Time  ? CHOL 102 08/04/2021 2313  ? CHOL 112 06/06/2021 1127  ? TRIG 39 08/04/2021 2313  ? HDL 44 08/04/2021 2313  ? HDL 50 06/06/2021 1127  ? CHOLHDL 2.3 08/04/2021 2313  ? VLDL 8 08/04/2021 2313  ? [place] 50 08/04/2021 2313  ? LDLCALC 49 06/06/2021 1127  ? LDLDIRECT 75 01/25/2012 1508  ? ? ?Physical Exam:   ? ?VS:  There were no vitals taken for this visit.   ? ?Wt Readings from Last 3 Encounters:  ?03/02/22 243 lb (110.2 kg)  ?02/15/22 243 lb 13.3 oz (110.6 kg)  ?01/20/22 250 lb (113.4 kg)  ?  ? ?GEN: obese, walker,  Well nourished, well developed in no acute  distress ?HEENT: Normal ?NECK: No JVD; No carotid bruits ?LYMPHATICS: No lymphadenopathy ?CARDIAC: RRR occasional ectopy, no murmurs, rubs, gallops ?RESPIRATORY:  Clear to auscultation without rales, wheezing or rhonchi  ?AB

## 2022-04-24 NOTE — Progress Notes (Signed)
[name], [name] ([id number]) ?Visit Report for 04/24/2022 ?Arrival Information Details ?Patient Name: Date of Service: ?[name], [name] 04/24/2022 2:15 PM ?Medical Record Number: [id number] ?Patient Account Number: [id number] ?Date of Birth/Sex: Treating RN: ?05/23/1951 (71 y.o. [name] ?Primary Care [name]: [name] Other Clinician: ?Referring Rollande [name]: ?Treating [name]/[name]: [name] ?[name] ?Weeks in Treatment: 3 ?Visit Information History Since Last Visit ?Added or deleted any medications: No ?Patient Arrived: Wheel Chair ?Any new allergies or adverse reactions: No ?Arrival Time: 14:38 ?Had a fall or experienced change in No ?Accompanied By: alone ?activities of daily living that may affect ?Transfer Assistance: Manual ?risk of falls: ?Patient Identification Verified: Yes ?Signs or symptoms of abuse/neglect since last visito No ?Secondary Verification Process Completed: Yes ?Hospitalized since last visit: No ?Patient Requires Transmission-Based Precautions: No ?Implantable device outside of the clinic excluding No ?Patient Has Alerts: Yes ?cellular tissue based products placed in the center ?Patient Alerts: Patient on Blood Thinner since last visit: ?pacemaker Has Dressing in Place as Prescribed: Yes ?Has Compression in Place as Prescribed: Yes ?Pain Present Now: No ?Electronic Signature(s) ?Signed: 04/24/2022 5:50:11 PM By: [name] Hurst RN, BSN ?Entered By: [name] on 04/24/2022 14:38:52 ?-------------------------------------------------------------------------------- ?Compression Therapy Details ?Patient Name: Date of Service: ?[name], [name] 04/24/2022 2:15 PM ?Medical Record Number: [id number] ?Patient Account Number: [id number] ?Date of Birth/Sex: Treating RN: ?12/08/1951 (71 y.o. [name] ?Primary Care Cathalina Barcia: [name] Other Clinician: ?Referring [name]: ?Treating [name]/[name]: [name] ?[name] ?Weeks in  Treatment: 3 ?Compression Therapy Performed for Wound Assessment: Wound #2 Right,Posterior Calcaneus ?Performed By: Clinician [name] Hurst, RN ?Compression Type: Three Layer ?Post Procedure Diagnosis ?Same as Pre-procedure ?Electronic Signature(s) ?Signed: 04/24/2022 5:50:11 PM By: [name] Hurst RN, BSN ?Entered By: [name] on 04/24/2022 15:14:28 ?-------------------------------------------------------------------------------- ?Lower Extremity Assessment Details ?Patient Name: ?Date of Service: ?[name], [name] 04/24/2022 2:15 PM ?Medical Record Number: [id number] ?Patient Account Number: [id number] ?Date of Birth/Sex: ?Treating RN: ?1951/02/09 (71 y.o. [name] ?Primary Care Cutberto Winfree: [name] ?Other Clinician: ?Referring Anisten Tomassi: ?Treating Festus Pursel/[name]: [name] ?[name] ?Weeks in Treatment: 3 ?Edema Assessment ?Assessed: [Left: No] [Right: No] ?E[Left: dema] [Right: :] ?Calf ?Left: Right: ?Point of Measurement: From Medial Instep 41 cm ?Ankle ?Left: Right: ?Point of Measurement: From Medial Instep 23 cm ?Vascular Assessment ?Pulses: ?Dorsalis Pedis ?Palpable: [Right:Yes] ?Electronic Signature(s) ?Signed: 04/24/2022 5:50:11 PM By: [name] Hurst RN, BSN ?Entered By: [name] on 04/24/2022 15:04:06 ?-------------------------------------------------------------------------------- ?Multi Wound Chart Details ?Patient Name: ?Date of Service: ?[name], [name] 04/24/2022 2:15 PM ?Medical Record Number: [id number] ?Patient Account Number: [id number] ?Date of Birth/Sex: ?Treating RN: ?May 26, 1951 (71 y.o. F) [name] ?Primary Care Trumaine Wimer: [name] ?Other Clinician: ?Referring Candas Deemer: ?Treating Demisha Nokes/[name]: [name] ?[name] ?Weeks in Treatment: 3 ?Vital Signs ?Height(in): 67 ?Capillary Blood Glucose(mg/dl): 202 ?Weight(lbs): 243 ?Pulse(bpm): 50 ?Body Mass Index(BMI): 38.1 ?Blood Pressure(mmHg): 121/67 ?Temperature(??F):  98.1 ?Respiratory Rate(breaths/min): 18 ?Photos: [1:Right, Anterior Lower Leg] [2:Right, Posterior Calcaneus] [N/A:N/A N/A] ?Wound Location: [1:Gradually Appeared] [2:Gradually Appeared] [N/A:N/A] ?Wounding Event: [1:Diabetic Wound/Ulcer of the Lower] [2:Diabetic Wound/Ulcer of the Lower] [N/A:N/A] ?Primary Etiology: [1:Extremity Cataracts, Anemia, Sleep Apnea,] [2:Extremity Cataracts, Anemia, Sleep Apnea,] [N/A:N/A] ?Comorbid History: [1:Congestive Heart Failure, Hypertension, Type I Diabetes, Received Radiation 01/25/2022] [2:Congestive Heart Failure, Hypertension, Type I Diabetes, Received Radiation 01/25/2022] [N/A:N/A] ?Date Acquired: [1:3] [2:3] [N/A:N/A] ?Weeks of Treatment: [1:Healed - Epithelialized] [2:Open] [N/A:N/A] ?Wound Status: [1:No] [2:No] [N/A:N/A] ?Wound Recurrence: [1:0x0x0] [2:1.9x1.3x0.1] [N/A:N/A] ?Measurements L x W x D (cm) [1:0] [2:1.94] [  N/A:N/A] ?A (cm?) : ?rea [1:0] [0:9.604] [N/A:N/A] ?Volume (cm?) : [1:100.00%] [2:-37.20%] [N/A:N/A] ?% Reduction in A [1:rea: 100.00%] [2:31.40%] [N/A:N/A] ?% Reduction in Volume: [1:Grade 1] [2:Grade 2] [N/A:N/A] ?Classification: [1:None Present] [2:Medium] [N/A:N/A] ?Exudate A mount: [1:N/A] [2:Serosanguineous] [N/A:N/A] ?Exudate Type: [1:N/A] [2:red, brown] [N/A:N/A] ?Exudate Color: [1:Flat and Intact] [2:Distinct, outline attached] [N/A:N/A] ?Wound Margin: [1:None Present (0%)] [2:Small (1-33%)] [N/A:N/A] ?Granulation A mount: [1:N/A] [2:Red, Pink] [N/A:N/A] ?Granulation Quality: [1:None Present (0%)] [2:Large (67-100%)] [N/A:N/A] ?Necrotic A mount: ?[1:Fascia: No] ?[2:Fat Layer (Subcutaneous Tissue): Yes N/A] ?Exposed Structures: ?[1:Fat Layer (Subcutaneous Tissue): No Tendon: No Muscle: No Joint: No Bone: No Large (67-100%)] [2:Fascia: No Tendon: No Muscle: No Joint: No Bone: No Small (1-33%)] [N/A:N/A] ?Epithelialization: [1:N/A] [2:Debridement - Selective/Open Wound N/A] ?Debridement: ?Pre-procedure Verification/Time Out N/A [2:15:12]  [N/A:N/A] ?Taken: [1:N/A] [2:Slough] [N/A:N/A] ?Tissue Debrided: [1:N/A] [2:Non-Viable Tissue] [N/A:N/A] ?Level: [1:N/A] [2:2.47] [N/A:N/A] ?Debridement A (sq cm): [1:rea N/A] [2:Curette] [N/A:N/A] ?Instrument: [1:N/A] [2:Minimum] [N/A:N/A] ?Bleeding: [1:N/A] [2:Pressure] [N/A:N/A] ?Hemostasis A chieved: [1:N/A] [2:0] [N/A:N/A] ?Procedural Pain: [1:N/A] [2:0] [N/A:N/A] ?Post Procedural Pain: [1:N/A] [2:Procedure was tolerated well] [N/A:N/A] ?Debridement Treatment Response: [1:N/A] [2:1.9x1.3x0.1] [N/A:N/A] ?Post Debridement Measurements L x ?W x D (cm) [1:N/A] [2:0.194] [N/A:N/A] ?Post Debridement Volume: (cm?) [1:N/A] [2:Compression Therapy] [N/A:N/A] ?Procedures Performed: [2:Debridement] ?Treatment Notes ?Electronic Signature(s) ?Signed: 04/24/2022 3:33:51 PM By: [name] Maudlin MD FACS ?Signed: 04/24/2022 5:26:20 PM By: Baruch Gouty RN, BSN ?Entered By: [name] on 04/24/2022 15:33:51 ?-------------------------------------------------------------------------------- ?Multi-Disciplinary Care Plan Details ?Patient Name: ?Date of Service: ?[name], [name] 04/24/2022 2:15 PM ?Medical Record Number: [id number] ?Patient Account Number: [id number] ?Date of Birth/Sex: ?Treating RN: ?Aug 20, 1951 (71 y.o. [name] ?Primary Care Rielyn Krupinski: [name] ?Other Clinician: ?Referring Jiro Kiester: ?Treating Emmalea Treanor/[name]: [name] ?[name] ?Weeks in Treatment: 3 ?Multidisciplinary Care Plan reviewed with physician ?Active Inactive ?Abuse / Safety / Falls / Self Care Management ?Nursing Diagnoses: ?History of Falls ?Impaired physical mobility ?Goals: ?Patient/caregiver will verbalize/demonstrate measures taken to prevent injury and/or falls ?Date Initiated: 04/03/2022 ?Target Resolution Date: 05/08/2022 ?Goal Status: Active ?Interventions: ?Assess fall risk on admission and as needed ?Assess: immobility, friction, shearing, incontinence upon admission and as needed ?Assess impairment of  mobility on admission and as needed per policy ?Notes: ?Nutrition ?Nursing Diagnoses: ?Impaired glucose control: actual or potential ?Potential for alteratiion in Nutrition/Potential for imbalanced nutrition ?Goals: ?Patient/caregiv

## 2022-04-24 NOTE — Progress Notes (Signed)
Acevedo, [name] ([id number]) ?Visit Report for 04/24/2022 ?Chief Complaint Document Details ?Patient Name: Date of Service: ?Syler, [name] 04/24/2022 2:15 PM ?Medical Record Number: [id number] ?Patient Account Number: [id number] ?Date of Birth/Sex: Treating RN: ?1951-08-05 (71 y.o. F) Boehlein, [name] ?Primary Care Provider: Stark Klein Other Clinician: ?Referring Provider: ?Treating Provider/Extender: Fredirick Maudlin ?Stark Klein ?Weeks in Treatment: 3 ?Information Obtained from: Patient ?Chief Complaint ?Patient presents for treatment of open ulcers due to venous insufficiency in the setting of poorly controlled DM1 and CHF ?Electronic Signature(s) ?Signed: 04/24/2022 3:37:29 PM By: Fredirick Maudlin MD FACS ?Entered By: Fredirick Maudlin on 04/24/2022 15:37:29 ?-------------------------------------------------------------------------------- ?Debridement Details ?Patient Name: Date of Service: ?Kleinpeter, [name] 04/24/2022 2:15 PM ?Medical Record Number: [id number] ?Patient Account Number: [id number] ?Date of Birth/Sex: Treating RN: ?Jun 04, 1951 (71 y.o. [name], [name] ?Primary Care Provider: Stark Klein Other Clinician: ?Referring Provider: ?Treating Provider/Extender: Fredirick Maudlin ?Stark Klein ?Weeks in Treatment: 3 ?Debridement Performed for Assessment: Wound #2 Right,Posterior Calcaneus ?Performed By: Physician Fredirick Maudlin, MD ?Debridement Type: Debridement ?Severity of Tissue Pre Debridement: Fat layer exposed ?Level of Consciousness (Pre-procedure): Awake and Alert ?Pre-procedure Verification/Time Out Yes - 15:12 ?Taken: ?Start Time: 15:12 ?T Area Debrided (L x W): ?otal 1.9 (cm) x 1.3 (cm) = 2.47 (cm?) ?Tissue and other material debrided: Non-Viable, [place], Frederika ?Level: Non-Viable Tissue ?Debridement Description: Selective/Open Wound ?Instrument: Curette ?Bleeding: Minimum ?Hemostasis Achieved: Pressure ?End Time: 15:13 ?Procedural Pain: 0 ?Post Procedural Pain:  0 ?Response to Treatment: Procedure was tolerated well ?Level of Consciousness (Post- Awake and Alert ?procedure): ?Post Debridement Measurements of Total Wound ?Length: (cm) 1.9 ?Width: (cm) 1.3 ?Depth: (cm) 0.1 ?Volume: (cm?) 0.194 ?Character of Wound/Ulcer Post Debridement: Requires Further Debridement ?Severity of Tissue Post Debridement: Fat layer exposed ?Post Procedure Diagnosis ?Same as Pre-procedure ?Electronic Signature(s) ?Signed: 04/24/2022 4:45:35 PM By: Fredirick Maudlin MD FACS ?Signed: 04/24/2022 5:50:11 PM By: Levan Hurst RN, BSN ?Entered By: Levan Hurst on 04/24/2022 15:14:13 ?-------------------------------------------------------------------------------- ?HPI Details ?Patient Name: Date of Service: ?Wedge, [name] 04/24/2022 2:15 PM ?Medical Record Number: [id number] ?Patient Account Number: [id number] ?Date of Birth/Sex: Treating RN: ?Mar 21, 1951 (71 y.o. F) Boehlein, [name] ?Primary Care Provider: Stark Klein Other Clinician: ?Referring Provider: ?Treating Provider/Extender: Fredirick Maudlin ?Stark Klein ?Weeks in Treatment: 3 ?History of Present Illness ?HPI Description: ADMISSION ?04/03/2022 ?This is a 71 year old woman with a past medical history notable for type 1 diabetes mellitus, poorly controlled with her most recent A1c being 8.3, congestive ?heart failure with recent admission for exacerbation secondary to noncompliance with diuretics, as well as history of pontine stroke and oxygen dependence. ?She has wounds on her bilateral lower extremities. She says they have been present for about 2 months and initially started as blisters. She says that she has ?compression stockings, but does not wear them because they are too difficult to get on. ABIs done in clinic were 1.4 and 1.5, suggesting some degree of ?noncompressibility. She also has a wound on her right heel that she says is secondary to it getting bumped frequently as she transfers. She came to clinic with ?just dry  dressings on her legs. She does have home health care, as well as her sister that have been assisting her with her dressings. ?She has multiple wounds. On the left medial calf, just above her ankle, there is a somewhat geographic wound with slough and some epithelialization. On the ?anterior tibial surface and lateral calf, the wounds are quite desiccated with yellow eschar present. On the right anterior tibial surface,  the wounds are gritty with ?thin slough and eschar. The posterior right ankle wound is just above the calcaneus overlying the Achilles and has thick yellow-gray slough and a bit of odor. ?No purulent drainage appreciated. ?04/10/2022: The wounds on her left leg are closed. On the right, the anterior tibial surface wounds are epithelializing and much cleaner. There is just a layer of ?biofilm on the open areas. On the posterior right ankle wound, there is a decreased volume of slough and no odor. We have been using Hydrofera Blue to all ?sites under compression. ?04/17/2022: The wounds on her left leg remained closed and she did bring her juxta lite stockings with her today. On the right, the more distal wound is closed ?and the proximal wound has epithelialized significantly since her last visit. At the posterior right ankle, she still has accumulated some slough and some ?periwound callus but there is no odor or significant drainage. ?04/24/2022: The right anterior leg wounds are closed. She still has a wound on her posterior right ankle that looks about the same as last week. She did not bring ?her right sided juxta light with her today but is wearing the left sided stocking. ?Electronic Signature(s) ?Signed: 04/24/2022 3:40:33 PM By: Fredirick Maudlin MD FACS ?Entered By: Fredirick Maudlin on 04/24/2022 15:40:33 ?-------------------------------------------------------------------------------- ?Physical Exam Details ?Patient Name: Date of Service: ?Crampton, [name] 04/24/2022 2:15 PM ?Medical Record  Number: [id number] ?Patient Account Number: [id number] ?Date of Birth/Sex: Treating RN: ?21-Nov-1951 (71 y.o. F) Boehlein, [name] ?Primary Care Provider: Stark Klein Other Clinician: ?Referring Provider: ?Treating Provider/Extender: Fredirick Maudlin ?Stark Klein ?Weeks in Treatment: 3 ?Constitutional ?. Bradycardic, asymptomatic.. . . No acute distress. ?Respiratory ?Normal work of breathing on supplemental oxygen. ?Notes ?04/24/2022: The anterior tibial wounds are now completely closed. The posterior ankle wound is still about the same size with some accumulated slough. ?Electronic Signature(s) ?Signed: 04/24/2022 3:43:14 PM By: Fredirick Maudlin MD FACS ?Entered By: Fredirick Maudlin on 04/24/2022 15:43:14 ?-------------------------------------------------------------------------------- ?Physician Orders Details ?Patient Name: ?Date of Service: ?Gilardi, [name] 04/24/2022 2:15 PM ?Medical Record Number: [id number] ?Patient Account Number: [id number] ?Date of Birth/Sex: ?Treating RN: ?04-23-1951 (71 y.o. 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 - Dr. Celine Ahr Room 1 - Monday 5/8 at 2:00 ?Return A ?Bathing/ Shower/ Hygiene ?May shower with protection but do not get wound dressing(s)  wet. ?Edema Control - Lymphedema / SCD / Other ?Bilateral Lower Extremities ?Elevate legs to the level of the heart or above for 30 minutes daily and/or when sitting, a frequency of: - throughout the day ?Exercise regula

## 2022-04-25 ENCOUNTER — Ambulatory Visit (HOSPITAL_COMMUNITY): Payer: Medicare PPO | Admitting: Physician Assistant

## 2022-04-25 ENCOUNTER — Ambulatory Visit: Payer: Medicare PPO | Admitting: Cardiology

## 2022-05-01 ENCOUNTER — Encounter (HOSPITAL_BASED_OUTPATIENT_CLINIC_OR_DEPARTMENT_OTHER): Payer: Medicare PPO | Admitting: General Surgery

## 2022-05-01 DIAGNOSIS — L97412 Non-pressure chronic ulcer of right heel and midfoot with fat layer exposed: Secondary | ICD-10-CM | POA: Diagnosis not present

## 2022-05-01 DIAGNOSIS — I872 Venous insufficiency (chronic) (peripheral): Secondary | ICD-10-CM | POA: Diagnosis not present

## 2022-05-01 DIAGNOSIS — E10621 Type 1 diabetes mellitus with foot ulcer: Secondary | ICD-10-CM | POA: Diagnosis not present

## 2022-05-01 DIAGNOSIS — L97811 Non-pressure chronic ulcer of other part of right lower leg limited to breakdown of skin: Secondary | ICD-10-CM | POA: Diagnosis not present

## 2022-05-01 DIAGNOSIS — I110 Hypertensive heart disease with heart failure: Secondary | ICD-10-CM | POA: Diagnosis not present

## 2022-05-01 DIAGNOSIS — I5032 Chronic diastolic (congestive) heart failure: Secondary | ICD-10-CM | POA: Diagnosis not present

## 2022-05-01 DIAGNOSIS — E10622 Type 1 diabetes mellitus with other skin ulcer: Secondary | ICD-10-CM | POA: Diagnosis not present

## 2022-05-01 DIAGNOSIS — E1065 Type 1 diabetes mellitus with hyperglycemia: Secondary | ICD-10-CM | POA: Diagnosis not present

## 2022-05-01 DIAGNOSIS — Z8673 Personal history of transient ischemic attack (TIA), and cerebral infarction without residual deficits: Secondary | ICD-10-CM | POA: Diagnosis not present

## 2022-05-01 DIAGNOSIS — Z9981 Dependence on supplemental oxygen: Secondary | ICD-10-CM | POA: Diagnosis not present

## 2022-05-01 DIAGNOSIS — L97821 Non-pressure chronic ulcer of other part of left lower leg limited to breakdown of skin: Secondary | ICD-10-CM | POA: Diagnosis not present

## 2022-05-01 DIAGNOSIS — I10 Essential (primary) hypertension: Secondary | ICD-10-CM | POA: Diagnosis not present

## 2022-05-01 NOTE — Progress Notes (Signed)
Abrell, [name] ([id number]) ?Visit Report for 05/01/2022 ?Chief Complaint Document Details ?Patient Name: Date of Service: ?[name], [name] 05/01/2022 2:00 PM ?Medical Record Number: [id number] ?Patient Account Number: [id number] ?Date of Birth/Sex: Treating RN: ?06-05-1951 (71 y.o. F) Boehlein, [name] ?Primary Care Provider: Stark Klein Other Clinician: ?Referring Provider: ?Treating Provider/Extender: Fredirick Maudlin ?Stark Klein ?Weeks in Treatment: 4 ?Information Obtained from: Patient ?Chief Complaint ?Patient presents for treatment of open ulcers due to venous insufficiency in the setting of poorly controlled DM1 and CHF ?Electronic Signature(s) ?Signed: 05/01/2022 2:48:50 PM By: Fredirick Maudlin MD FACS ?Entered By: Fredirick Maudlin on 05/01/2022 14:48:50 ?-------------------------------------------------------------------------------- ?Debridement Details ?Patient Name: Date of Service: ?[name], [name] 05/01/2022 2:00 PM ?Medical Record Number: [id number] ?Patient Account Number: [id number] ?Date of Birth/Sex: Treating RN: ?[birth year] (71 y.o. [name] ?Primary Care Provider: Stark Klein Other Clinician: ?Referring Provider: ?Treating Provider/Extender: Fredirick Maudlin ?Stark Klein ?Weeks in Treatment: 4 ?Debridement Performed for Assessment: Wound #2 Right,Posterior Calcaneus ?Performed By: Physician Fredirick Maudlin, MD ?Debridement Type: Debridement ?Severity of Tissue Pre Debridement: Fat layer exposed ?Level of Consciousness (Pre-procedure): Awake and Alert ?Pre-procedure Verification/Time Out Yes - 14:35 ?Taken: ?Start Time: 14:35 ?Pain Control: ?Other : benzocaine 20% spray ?T Area Debrided (L x W): ?otal 1.7 (cm) x 1 (cm) = 1.7 (cm?) ?Tissue and other material debrided: Viable, Non-Viable, Slough, Subcutaneous, Slough ?Level: Skin/Subcutaneous Tissue ?Debridement Description: Excisional ?Instrument: Curette ?Bleeding: Minimum ?Hemostasis Achieved:  Pressure ?Procedural Pain: 2 ?Post Procedural Pain: 0 ?Response to Treatment: Procedure was tolerated well ?Level of Consciousness (Post- Awake and Alert ?procedure): ?Post Debridement Measurements of Total Wound ?Length: (cm) 1.7 ?Width: (cm) 1 ?Depth: (cm) 0.1 ?Volume: (cm?) 0.134 ?Character of Wound/Ulcer Post Debridement: Improved ?Severity of Tissue Post Debridement: Fat layer exposed ?Post Procedure Diagnosis ?Same as Pre-procedure ?Electronic Signature(s) ?Signed: 05/01/2022 3:55:57 PM By: Fredirick Maudlin MD FACS ?Signed: 05/01/2022 4:43:42 PM By: Adline Peals ?Entered By: Adline Peals on 05/01/2022 14:39:12 ?-------------------------------------------------------------------------------- ?HPI Details ?Patient Name: Date of Service: ?[name], [name] 05/01/2022 2:00 PM ?Medical Record Number: [id number] ?Patient Account Number: [id number] ?Date of Birth/Sex: Treating RN: ?November 05, 1951 (71 y.o. F) Boehlein, [name] ?Primary Care Provider: Stark Klein Other Clinician: ?Referring Provider: ?Treating Provider/Extender: Fredirick Maudlin ?Stark Klein ?Weeks in Treatment: 4 ?History of Present Illness ?HPI Description: ADMISSION ?04/03/2022 ?This is a 71 year old woman with a past medical history notable for type 1 diabetes mellitus, poorly controlled with her most recent A1c being 8.3, congestive ?heart failure with recent admission for exacerbation secondary to noncompliance with diuretics, as well as history of pontine stroke and oxygen dependence. ?She has wounds on her bilateral lower extremities. She says they have been present for about 2 months and initially started as blisters. She says that she has ?compression stockings, but does not wear them because they are too difficult to get on. ABIs done in clinic were 1.4 and 1.5, suggesting some degree of ?noncompressibility. She also has a wound on her right heel that she says is secondary to it getting bumped frequently as she transfers. She came  to clinic with ?just dry dressings on her legs. She does have home health care, as well as her sister that have been assisting her with her dressings. ?She has multiple wounds. On the left medial calf, just above her ankle, there is a somewhat geographic wound with slough and some epithelialization. On the ?anterior tibial surface and lateral calf, the wounds are quite desiccated with yellow eschar present. On the right anterior tibial  surface, the wounds are gritty with ?thin slough and eschar. The posterior right ankle wound is just above the calcaneus overlying the Achilles and has thick yellow-gray slough and a bit of odor. ?No purulent drainage appreciated. ?04/10/2022: The wounds on her left leg are closed. On the right, the anterior tibial surface wounds are epithelializing and much cleaner. There is just a layer of ?biofilm on the open areas. On the posterior right ankle wound, there is a decreased volume of slough and no odor. We have been using Hydrofera Blue to all ?sites under compression. ?04/17/2022: The wounds on her left leg remained closed and she did bring her juxta lite stockings with her today. On the right, the more distal wound is closed ?and the proximal wound has epithelialized significantly since her last visit. At the posterior right ankle, she still has accumulated some slough and some ?periwound callus but there is no odor or significant drainage. ?04/24/2022: The right anterior leg wounds are closed. She still has a wound on her posterior right ankle that looks about the same as last week. She did not bring ?her right sided juxta light with her today but is wearing the left sided stocking. ?05/01/2022: She has a tiny abrasion on her left anterior tibia which we did not add to her wound count today. The wound on her posterior right ankle is a little bit ?smaller with a little accumulated slough. No drainage or concern for infection. ?Electronic Signature(s) ?Signed: 05/01/2022 2:49:51 PM By:  Fredirick Maudlin MD FACS ?Entered By: Fredirick Maudlin on 05/01/2022 14:49:50 ?-------------------------------------------------------------------------------- ?Physical Exam Details ?Patient Name: Date of Service: ?[name], [name] 05/01/2022 2:00 PM ?Medical Record Number: [id number] ?Patient Account Number: [id number] ?Date of Birth/Sex: Treating RN: ?1951/04/14 (71 y.o. F) Boehlein, [name] ?Primary Care Provider: Stark Klein Other Clinician: ?Referring Provider: ?Treating Provider/Extender: Fredirick Maudlin ?Stark Klein ?Weeks in Treatment: 4 ?Constitutional ?. . . . No acute distress. ?Respiratory ?Normal work of breathing on room air. ?Notes ?05/01/2022: She has a tiny abrasion on her left anterior tibia which we did not add to her wound count today. The wound on her posterior right ankle is a little bit ?smaller with a little accumulated slough. No drainage or concern for infection. ?Electronic Signature(s) ?Signed: 05/01/2022 2:51:58 PM By: Fredirick Maudlin MD FACS ?Entered By: Fredirick Maudlin on 05/01/2022 14:51:58 ?-------------------------------------------------------------------------------- ?Physician Orders Details ?Patient Name: Date of Service: ?[name], [name] 05/01/2022 2:00 PM ?Medical Record Number: [id number] ?Patient Account Number: [id number] ?Date of Birth/Sex: Treating RN: ?[birth year] (71 y.o. [name] ?Primary Care Provider: Stark Klein Other Clinician: ?Referring Provider: ?Treating Provider/Extender: Fredirick Maudlin ?Stark Klein ?Weeks in Treatment: 4 ?Verbal / Phone Orders: No ?Diagnosis Coding ?ICD-10 Coding ?Code Description ?L97.312 Non-pressure chronic ulcer of right ankle with fat layer exposed ?I89.0 Lymphedema, not elsewhere classified ?X50.56 Chronic diastolic (congestive) heart failure ?E10.59 Type 1 diabetes mellitus with other circulatory complications ?E10.42 Type 1 diabetes mellitus with diabetic polyneuropathy ?Z68.41 Body mass index  [BMI]40.0-44.9, adult ?P79.480 Type 1 diabetes mellitus with other skin ulcer ?Follow-up Appointments ?ppointment in 1 week. - Dr. Celine Ahr Room 1 ?Return A ?Bathing/ Shower/ Hygiene ?May shower with protection but do not get wo

## 2022-05-03 NOTE — Progress Notes (Signed)
[name], [name] ([id number]) ?Visit Report for 05/01/2022 ?Arrival Information Details ?Patient Name: Date of Service: ?[name], [name] 05/01/2022 2:00 PM ?Medical Record Number: [id number] ?Patient Account Number: [id number] ?Date of Birth/Sex: Treating RN: ?14-Apr-1951 (71 y.o. F) Boehlein, [name] ?Primary Care Kron Everton: Stark Klein Other Clinician: ?Referring Elisah Parmer: ?Treating Jamel Holzmann/Extender: Fredirick Maudlin ?Stark Klein ?Weeks in Treatment: 4 ?Visit Information History Since Last Visit ?Added or deleted any medications: No ?Patient Arrived: Wheel Chair ?Any new allergies or adverse reactions: No ?Arrival Time: 14:12 ?Had a fall or experienced change in No ?Accompanied By: self ?activities of daily living that may affect ?Transfer Assistance: Manual ?risk of falls: ?Patient Identification Verified: Yes ?Signs or symptoms of abuse/neglect since last visito No ?Secondary Verification Process Completed: Yes ?Hospitalized since last visit: No ?Patient Requires Transmission-Based Precautions: No ?Implantable device outside of the clinic excluding No ?Patient Has Alerts: Yes ?cellular tissue based products placed in the center ?Patient Alerts: Patient on Blood Thinner since last visit: ?pacemaker Has Dressing in Place as Prescribed: Yes ?Has Compression in Place as Prescribed: Yes ?Pain Present Now: No ?Electronic Signature(s) ?Signed: 05/03/2022 7:47:55 AM By: Sandre Kitty ?Entered By: Sandre Kitty on 05/01/2022 14:14:43 ?-------------------------------------------------------------------------------- ?Compression Therapy Details ?Patient Name: Date of Service: ?[name], [name] 05/01/2022 2:00 PM ?Medical Record Number: [id number] ?Patient Account Number: [id number] ?Date of Birth/Sex: Treating RN: ?[birth year] (71 y.o. [name] ?Primary Care Vinal Rosengrant: Stark Klein Other Clinician: ?Referring Diontre Harps: ?Treating Tambi Thole/Extender: Fredirick Maudlin ?Stark Klein ?Weeks in  Treatment: 4 ?Compression Therapy Performed for Wound Assessment: Wound #2 Right,Posterior Calcaneus ?Performed By: Clinician Adline Peals, RN ?Compression Type: Three Layer ?Post Procedure Diagnosis ?Same as Pre-procedure ?Electronic Signature(s) ?Signed: 05/01/2022 4:43:42 PM By: Adline Peals ?Entered By: Adline Peals on 05/01/2022 14:39:22 ?-------------------------------------------------------------------------------- ?Encounter Discharge Information Details ?Patient Name: ?Date of Service: ?[name], [name] 05/01/2022 2:00 PM ?Medical Record Number: [id number] ?Patient Account Number: [id number] ?Date of Birth/Sex: ?Treating RN: ?1951-12-01 (71 y.o. [name] ?Primary Care Dekisha Mesmer: Stark Klein ?Other Clinician: ?Referring Lucus Lambertson: ?Treating Ronald Londo/Extender: Fredirick Maudlin ?Stark Klein ?Weeks in Treatment: 4 ?Encounter Discharge Information Items Post Procedure Vitals ?Discharge Condition: Stable ?Temperature (F): 98.4 ?Ambulatory Status: Wheelchair ?Pulse (bpm): 61 ?Discharge Destination: Home ?Respiratory Rate (breaths/min): 18 ?Transportation: Private Auto ?Blood Pressure (mmHg): 119/77 ?Accompanied By: self ?Schedule Follow-up Appointment: Yes ?Clinical Summary of Care: Patient Declined ?Electronic Signature(s) ?Signed: 05/01/2022 4:43:42 PM By: Adline Peals ?Entered By: Adline Peals on 05/01/2022 15:20:52 ?-------------------------------------------------------------------------------- ?Lower Extremity Assessment Details ?Patient Name: ?Date of Service: ?[name], [name] 05/01/2022 2:00 PM ?Medical Record Number: [id number] ?Patient Account Number: [id number] ?Date of Birth/Sex: ?Treating RN: ?1951/11/07 (71 y.o. F) Boehlein, [name] ?Primary Care Caelynn Marshman: Stark Klein ?Other Clinician: ?Referring Ianmichael Amescua: ?Treating Adisynn Suleiman/Extender: Fredirick Maudlin ?Stark Klein ?Weeks in Treatment: 4 ?Edema Assessment ?Assessed: [Left: No] [Right:  No] ?E[Left: dema] [Right: :] ?Calf ?Left: Right: ?Point of Measurement: From Medial Instep 43 cm ?Ankle ?Left: Right: ?Point of Measurement: From Medial Instep 23 cm ?Vascular Assessment ?Pulses: ?Dorsalis Pedis ?Palpable: [Right:Yes] ?Electronic Signature(s) ?Signed: 05/01/2022 5:43:51 PM By: Baruch Gouty RN, BSN ?Signed: 05/03/2022 7:47:55 AM By: Sandre Kitty ?Entered By: Sandre Kitty on 05/01/2022 14:22:03 ?-------------------------------------------------------------------------------- ?Multi Wound Chart Details ?Patient Name: ?Date of Service: ?[name], [name] 05/01/2022 2:00 PM ?Medical Record Number: [id number] ?Patient Account Number: [id number] ?Date of Birth/Sex: ?Treating RN: ?1951/03/31 (71 y.o. F) Boehlein, [name] ?Primary Care Annamaria Salah: Stark Klein ?Other Clinician: ?Referring Dawnell Bryant: ?Treating Olean Sangster/Extender: Fredirick Maudlin ?Stark Klein ?Weeks in Treatment: 4 ?Vital  Signs ?Height(in): 67 ?Capillary Blood Glucose(mg/dl): 128 ?Weight(lbs): 243 ?Pulse(bpm): 61 ?Body Mass Index(BMI): 38.1 ?Blood Pressure(mmHg): 119/77 ?Temperature(??F): 98.4 ?Respiratory Rate(breaths/min): 18 ?Photos: [N/A:N/A] ?Right, Posterior Calcaneus N/A N/A ?Wound Location: ?Gradually Appeared N/A N/A ?Wounding Event: ?Diabetic Wound/Ulcer of the Lower N/A N/A ?Primary Etiology: ?Extremity ?Cataracts, Anemia, Sleep Apnea, N/A N/A ?Comorbid History: ?Congestive Heart Failure, ?Hypertension, Type I Diabetes, ?Received Radiation ?01/25/2022 N/A N/A ?Date Acquired: ?4 N/A N/A ?Weeks of Treatment: ?Open N/A N/A ?Wound Status: ?No N/A N/A ?Wound Recurrence: ?1.7x1x0.1 N/A N/A ?Measurements L x W x D (cm) ?1.335 N/A N/A ?A (cm?) : ?rea ?0.134 N/A N/A ?Volume (cm?) : ?5.60% N/A N/A ?% Reduction in A rea: ?52.70% N/A N/A ?% Reduction in Volume: ?Grade 2 N/A N/A ?Classification: ?Medium N/A N/A ?Exudate A mount: ?Serosanguineous N/A N/A ?Exudate Type: ?red, brown N/A N/A ?Exudate Color: ?Distinct, outline attached N/A  N/A ?Wound Margin: ?Large (67-100%) N/A N/A ?Granulation A mount: ?Red, Pink N/A N/A ?Granulation Quality: ?Small (1-33%) N/A N/A ?Necrotic A mount: ?Fat Layer (Subcutaneous Tissue): Yes N/A N/A ?Exposed Structures: ?Fascia: No ?Tendon: No ?Muscle: No ?Joint: No ?Bone: No ?Medium (34-66%) N/A N/A ?Epithelialization: ?Debridement - Excisional N/A N/A ?Debridement: ?Pre-procedure Verification/Time Out 14:35 N/A N/A ?Taken: ?Other N/A N/A ?Pain Control: ?Subcutaneous, Slough N/A N/A ?Tissue Debrided: ?Skin/Subcutaneous Tissue N/A N/A ?Level: ?1.7 N/A N/A ?Debridement A (sq cm): ?rea ?Curette N/A N/A ?Instrument: ?Minimum N/A N/A ?Bleeding: ?Pressure N/A N/A ?Hemostasis A chieved: ?2 N/A N/A ?Procedural Pain: ?0 N/A N/A ?Post Procedural Pain: ?Procedure was tolerated well N/A N/A ?Debridement Treatment Response: ?1.7x1x0.1 N/A N/A ?Post Debridement Measurements L x ?W x D (cm) ?0.134 N/A N/A ?Post Debridement Volume: (cm?) ?Compression Therapy N/A N/A ?Procedures Performed: ?Debridement ?Treatment Notes ?Electronic Signature(s) ?Signed: 05/01/2022 2:48:40 PM By: Fredirick Maudlin MD FACS ?Signed: 05/01/2022 5:43:51 PM By: Baruch Gouty RN, BSN ?Entered By: Fredirick Maudlin on 05/01/2022 14:48:40 ?-------------------------------------------------------------------------------- ?Multi-Disciplinary Care Plan Details ?Patient Name: ?Date of Service: ?[name], [name] 05/01/2022 2:00 PM ?Medical Record Number: [id number] ?Patient Account Number: [id number] ?Date of Birth/Sex: ?Treating RN: ?[birth year] (71 y.o. F) Boehlein, [name] ?Primary Care Sheffield Hawker: Stark Klein ?Other Clinician: ?Referring Lupe Bonner: ?Treating Laticha Ferrucci/Extender: Fredirick Maudlin ?Stark Klein ?Weeks in Treatment: 4 ?Multidisciplinary Care Plan reviewed with physician ?Active Inactive ?Abuse / Safety / Falls / Self Care Management ?Nursing Diagnoses: ?History of Falls ?Impaired physical mobility ?Goals: ?Patient/caregiver will verbalize/demonstrate  measures taken to prevent injury and/or falls ?Date Initiated: 04/03/2022 ?Target Resolution Date: 05/30/2022 ?Goal Status: Active ?Interventions: ?Assess fall risk on admission and as needed ?Assess: immobility, fri

## 2022-05-08 ENCOUNTER — Encounter (HOSPITAL_BASED_OUTPATIENT_CLINIC_OR_DEPARTMENT_OTHER): Payer: Medicare PPO | Admitting: General Surgery

## 2022-05-08 ENCOUNTER — Telehealth: Payer: Self-pay | Admitting: *Deleted

## 2022-05-08 ENCOUNTER — Other Ambulatory Visit: Payer: Self-pay | Admitting: Family Medicine

## 2022-05-08 DIAGNOSIS — Z8673 Personal history of transient ischemic attack (TIA), and cerebral infarction without residual deficits: Secondary | ICD-10-CM | POA: Diagnosis not present

## 2022-05-08 DIAGNOSIS — I110 Hypertensive heart disease with heart failure: Secondary | ICD-10-CM | POA: Diagnosis not present

## 2022-05-08 DIAGNOSIS — L97412 Non-pressure chronic ulcer of right heel and midfoot with fat layer exposed: Secondary | ICD-10-CM | POA: Diagnosis not present

## 2022-05-08 DIAGNOSIS — E10621 Type 1 diabetes mellitus with foot ulcer: Secondary | ICD-10-CM | POA: Diagnosis not present

## 2022-05-08 DIAGNOSIS — E1065 Type 1 diabetes mellitus with hyperglycemia: Secondary | ICD-10-CM | POA: Diagnosis not present

## 2022-05-08 DIAGNOSIS — L97821 Non-pressure chronic ulcer of other part of left lower leg limited to breakdown of skin: Secondary | ICD-10-CM | POA: Diagnosis not present

## 2022-05-08 DIAGNOSIS — I890 Lymphedema, not elsewhere classified: Secondary | ICD-10-CM | POA: Diagnosis not present

## 2022-05-08 DIAGNOSIS — E10622 Type 1 diabetes mellitus with other skin ulcer: Secondary | ICD-10-CM | POA: Diagnosis not present

## 2022-05-08 DIAGNOSIS — L97811 Non-pressure chronic ulcer of other part of right lower leg limited to breakdown of skin: Secondary | ICD-10-CM | POA: Diagnosis not present

## 2022-05-08 DIAGNOSIS — F39 Unspecified mood [affective] disorder: Secondary | ICD-10-CM

## 2022-05-08 DIAGNOSIS — I5032 Chronic diastolic (congestive) heart failure: Secondary | ICD-10-CM | POA: Diagnosis not present

## 2022-05-08 DIAGNOSIS — Z9981 Dependence on supplemental oxygen: Secondary | ICD-10-CM | POA: Diagnosis not present

## 2022-05-08 DIAGNOSIS — I872 Venous insufficiency (chronic) (peripheral): Secondary | ICD-10-CM | POA: Diagnosis not present

## 2022-05-08 NOTE — Progress Notes (Signed)
Sikora, [name] ([id number]) ?Visit Report for 05/08/2022 ?Arrival Information Details ?Patient Name: Date of Service: ?Cimo, [name] 05/08/2022 3:00 PM ?Medical Record Number: [id number] ?Patient Account Number: [id number] ?Date of Birth/Sex: Treating RN: ?Oct 10, 1951 (72 y.o. F) Scotton, Mechele Claude ?Primary Care Biannca Scantlin: Stark Klein Other Clinician: ?Referring Izabelle Daus: ?Treating Ellianna Ruest/Extender: Fredirick Maudlin ?Stark Klein ?Weeks in Treatment: 5 ?Visit Information History Since Last Visit ?Added or deleted any medications: No ?Patient Arrived: Wheel Chair ?Any new allergies or adverse reactions: No ?Arrival Time: 15:17 ?Had a fall or experienced change in No ?Accompanied By: friend ?activities of daily living that may affect ?Transfer Assistance: Manual ?risk of falls: ?Patient Requires Transmission-Based Precautions: No ?Signs or symptoms of abuse/neglect since last visito No ?Patient Has Alerts: Yes ?Hospitalized since last visit: No ?Patient Alerts: Patient on Blood Thinner ?Implantable device outside of the clinic excluding No ?pacemaker ?cellular tissue based products placed in the center ?since last visit: ?Has Dressing in Place as Prescribed: Yes ?Pain Present Now: No ?Electronic Signature(s) ?Signed: 05/08/2022 5:47:59 PM By: Dellie Catholic RN ?Entered By: Dellie Catholic on 05/08/2022 15:19:15 ?-------------------------------------------------------------------------------- ?Compression Therapy Details ?Patient Name: Date of Service: ?Burgo, [name] 05/08/2022 3:00 PM ?Medical Record Number: [id number] ?Patient Account Number: [id number] ?Date of Birth/Sex: Treating RN: ?[birth year] (71 y.o. [name] ?Primary Care Bob Eastwood: Stark Klein Other Clinician: ?Referring Terrace Fontanilla: ?Treating Milah Recht/Extender: Fredirick Maudlin ?Stark Klein ?Weeks in Treatment: 5 ?Compression Therapy Performed for Wound Assessment: Wound #2 Right,Posterior Calcaneus ?Performed By:  Clinician Adline Peals, RN ?Compression Type: Three Layer ?Post Procedure Diagnosis ?Same as Pre-procedure ?Electronic Signature(s) ?Signed: 05/08/2022 5:51:04 PM By: Adline Peals ?Entered By: Adline Peals on 05/08/2022 17:30:21 ?-------------------------------------------------------------------------------- ?Encounter Discharge Information Details ?Patient Name: ?Date of Service: ?Kalp, [name] 05/08/2022 3:00 PM ?Medical Record Number: [id number] ?Patient Account Number: [id number] ?Date of Birth/Sex: ?Treating RN: ?1951-05-09 (71 y.o. [name] ?Primary Care Isidor Bromell: Stark Klein ?Other Clinician: ?Referring Anothony Bursch: ?Treating Aleena Kirkeby/Extender: Fredirick Maudlin ?Stark Klein ?Weeks in Treatment: 5 ?Encounter Discharge Information Items Post Procedure Vitals ?Discharge Condition: Stable ?Temperature (F): 98.1 ?Ambulatory Status: Wheelchair ?Pulse (bpm): 55 ?Discharge Destination: Home ?Respiratory Rate (breaths/min): 18 ?Transportation: Private Auto ?Blood Pressure (mmHg): 127/69 ?Accompanied By: self ?Schedule Follow-up Appointment: Yes ?Clinical Summary of Care: Patient Declined ?Electronic Signature(s) ?Signed: 05/08/2022 5:51:04 PM By: Adline Peals ?Entered By: Adline Peals on 05/08/2022 17:27:12 ?-------------------------------------------------------------------------------- ?Lower Extremity Assessment Details ?Patient Name: ?Date of Service: ?Duffy, [name] 05/08/2022 3:00 PM ?Medical Record Number: [id number] ?Patient Account Number: [id number] ?Date of Birth/Sex: ?Treating RN: ?August 08, 1951 (71 y.o. F) Scotton, Mechele Claude ?Primary Care Marcia Hartwell: Stark Klein ?Other Clinician: ?Referring Tinslee Klare: ?Treating Ramla Hase/Extender: Fredirick Maudlin ?Stark Klein ?Weeks in Treatment: 5 ?Edema Assessment ?Assessed: [Left: No] [Right: No] ?E[Left: dema] [Right: :] ?Calf ?Left: Right: ?Point of Measurement: From Medial Instep 40.1 cm ?Ankle ?Left:  Right: ?Point of Measurement: From Medial Instep 23 cm ?Electronic Signature(s) ?Signed: 05/08/2022 5:47:59 PM By: Dellie Catholic RN ?Entered By: Dellie Catholic on 05/08/2022 15:24:32 ?-------------------------------------------------------------------------------- ?Multi Wound Chart Details ?Patient Name: ?Date of Service: ?Ferch, [name] 05/08/2022 3:00 PM ?Medical Record Number: [id number] ?Patient Account Number: [id number] ?Date of Birth/Sex: ?Treating RN: ?[birth year] (71 y.o. [name] ?Primary Care Missael Ferrari: Stark Klein ?Other Clinician: ?Referring Trentan Trippe: ?Treating Brant Peets/Extender: Fredirick Maudlin ?Stark Klein ?Weeks in Treatment: 5 ?Vital Signs ?Height(in): 67 ?Pulse(bpm): 55 ?Weight(lbs): 243 ?Blood Pressure(mmHg): 127/69 ?Body Mass Index(BMI): 38.1 ?Temperature(??F): 98.1 ?Respiratory Rate(breaths/min): 18 ?Photos: [N/A:N/A] ?Right, Posterior Calcaneus N/A N/A ?Wound Location: ?Gradually Appeared  N/A N/A ?Wounding Event: ?Diabetic Wound/Ulcer of the Lower N/A N/A ?Primary Etiology: ?Extremity ?Cataracts, Anemia, Sleep Apnea, N/A N/A ?Comorbid History: ?Congestive Heart Failure, ?Hypertension, Type I Diabetes, ?Received Radiation ?01/25/2022 N/A N/A ?Date Acquired: ?5 N/A N/A ?Weeks of Treatment: ?Open N/A N/A ?Wound Status: ?No N/A N/A ?Wound Recurrence: ?1.3x0.9x0.1 N/A N/A ?Measurements L x W x D (cm) ?0.919 N/A N/A ?A (cm?) : ?rea ?0.092 N/A N/A ?Volume (cm?) : ?35.00% N/A N/A ?% Reduction in A rea: ?67.50% N/A N/A ?% Reduction in Volume: ?Grade 2 N/A N/A ?Classification: ?Medium N/A N/A ?Exudate A mount: ?Serosanguineous N/A N/A ?Exudate Type: ?red, brown N/A N/A ?Exudate Color: ?Distinct, outline attached N/A N/A ?Wound Margin: ?Large (67-100%) N/A N/A ?Granulation A mount: ?Red, Pink N/A N/A ?Granulation Quality: ?Small (1-33%) N/A N/A ?Necrotic A mount: ?Fat Layer (Subcutaneous Tissue): Yes N/A N/A ?Exposed Structures: ?Fascia: No ?Tendon: No ?Muscle: No ?Joint:  No ?Bone: No ?Medium (34-66%) N/A N/A ?Epithelialization: ?Debridement - Excisional N/A N/A ?Debridement: ?Pre-procedure Verification/Time Out 15:48 N/A N/A ?Taken: ?Other N/A N/A ?Pain Control: ?Subcutaneous, Slough N/A N/A ?Tissue Debrided: ?Skin/Subcutaneous Tissue N/A N/A ?Level: ?1.17 N/A N/A ?Debridement A (sq cm): ?rea ?Curette N/A N/A ?Instrument: ?Minimum N/A N/A ?Bleeding: ?Pressure N/A N/A ?Hemostasis A chieved: ?0 N/A N/A ?Procedural Pain: ?0 N/A N/A ?Post Procedural Pain: ?Procedure was tolerated well N/A N/A ?Debridement Treatment Response: ?1.3x0.9x0.1 N/A N/A ?Post Debridement Measurements L x ?W x D (cm) ?0.092 N/A N/A ?Post Debridement Volume: (cm?) ?Debridement N/A N/A ?Procedures Performed: ?Treatment Notes ?Electronic Signature(s) ?Signed: 05/08/2022 4:26:23 PM By: Fredirick Maudlin MD FACS ?Signed: 05/08/2022 5:51:04 PM By: Adline Peals ?Entered By: Fredirick Maudlin on 05/08/2022 16:26:23 ?-------------------------------------------------------------------------------- ?Multi-Disciplinary Care Plan Details ?Patient Name: ?Date of Service: ?Peyser, [name] 05/08/2022 3:00 PM ?Medical Record Number: [id number] ?Patient Account Number: [id number] ?Date of Birth/Sex: ?Treating RN: ?June 06, 1951 (71 y.o. [name] ?Primary Care Raimundo Corbit: Stark Klein ?Other Clinician: ?Referring Maraya Gwilliam: ?Treating Edem Tiegs/Extender: Fredirick Maudlin ?Stark Klein ?Weeks in Treatment: 5 ?Multidisciplinary Care Plan reviewed with physician ?Active Inactive ?Abuse / Safety / Falls / Self Care Management ?Nursing Diagnoses: ?History of Falls ?Impaired physical mobility ?Goals: ?Patient/caregiver will verbalize/demonstrate measures taken to prevent injury and/or falls ?Date Initiated: 04/03/2022 ?Target Resolution Date: 05/30/2022 ?Goal Status: Active ?Interventions: ?Assess fall risk on admission and as needed ?Assess: immobility, friction, shearing, incontinence upon admission and as needed ?Assess  impairment of mobility on admission and as needed per policy ?Notes: ?Nutrition ?Nursing Diagnoses: ?Impaired glucose control: actual or potential ?Potential for alteratiion in Nutrition/Potential for imbalanced nutrition

## 2022-05-08 NOTE — Telephone Encounter (Signed)
Patient called and would like to be set up with CCM to discuss therapy options for depression.  She states that she used to speak with Casimer Lanius, LCSW when she was here but would like to connect with someone else who can do virtual visits.  Will forward to MD to place a referral for CCM.   ? ?Thanks Jaelynne Hockley,CMA ? ?

## 2022-05-08 NOTE — Progress Notes (Signed)
Discher, [name] ([id number]) ?Visit Report for 05/08/2022 ?Chief Complaint Document Details ?Patient Name: Date of Service: ?[name], [name] 05/08/2022 3:00 PM ?Medical Record Number: [id number] ?Patient Account Number: [id number] ?Date of Birth/Sex: Treating RN: ?12-17-1951 (71 y.o. [name] ?Primary Care Provider: Stark Klein Other Clinician: ?Referring Provider: ?Treating Provider/Extender: Fredirick Maudlin ?Stark Klein ?Weeks in Treatment: 5 ?Information Obtained from: Patient ?Chief Complaint ?Patient presents for treatment of open ulcers due to venous insufficiency in the setting of poorly controlled DM1 and CHF ?Electronic Signature(s) ?Signed: 05/08/2022 4:26:29 PM By: Fredirick Maudlin MD FACS ?Entered By: Fredirick Maudlin on 05/08/2022 16:26:29 ?-------------------------------------------------------------------------------- ?Debridement Details ?Patient Name: Date of Service: ?[name], [name] 05/08/2022 3:00 PM ?Medical Record Number: [id number] ?Patient Account Number: [id number] ?Date of Birth/Sex: Treating RN: ?1951/04/17 (71 y.o. [name] ?Primary Care Provider: Stark Klein Other Clinician: ?Referring Provider: ?Treating Provider/Extender: Fredirick Maudlin ?Stark Klein ?Weeks in Treatment: 5 ?Debridement Performed for Assessment: Wound #2 Right,Posterior Calcaneus ?Performed By: Physician Fredirick Maudlin, MD ?Debridement Type: Debridement ?Severity of Tissue Pre Debridement: Fat layer exposed ?Level of Consciousness (Pre-procedure): Awake and Alert ?Pre-procedure Verification/Time Out Yes - 15:48 ?Taken: ?Start Time: 15:48 ?Pain Control: ?Other : benzocaine 20% ?T Area Debrided (L x W): ?otal 1.3 (cm) x 0.9 (cm) = 1.17 (cm?) ?Tissue and other material debrided: Viable, Non-Viable, Slough, Subcutaneous, Slough ?Level: Skin/Subcutaneous Tissue ?Debridement Description: Excisional ?Instrument: Curette ?Bleeding: Minimum ?Hemostasis Achieved:  Pressure ?Procedural Pain: 0 ?Post Procedural Pain: 0 ?Response to Treatment: Procedure was tolerated well ?Level of Consciousness (Post- Awake and Alert ?procedure): ?Post Debridement Measurements of Total Wound ?Length: (cm) 1.3 ?Width: (cm) 0.9 ?Depth: (cm) 0.1 ?Volume: (cm?) 0.092 ?Character of Wound/Ulcer Post Debridement: Improved ?Severity of Tissue Post Debridement: Fat layer exposed ?Post Procedure Diagnosis ?Same as Pre-procedure ?Electronic Signature(s) ?Signed: 05/08/2022 5:05:09 PM By: Fredirick Maudlin MD FACS ?Signed: 05/08/2022 5:51:04 PM By: Adline Peals ?Entered By: Adline Peals on 05/08/2022 15:48:44 ?-------------------------------------------------------------------------------- ?HPI Details ?Patient Name: Date of Service: ?[name], [name] 05/08/2022 3:00 PM ?Medical Record Number: [id number] ?Patient Account Number: [id number] ?Date of Birth/Sex: Treating RN: ?[birth year] (71 y.o. [name] ?Primary Care Provider: Stark Klein Other Clinician: ?Referring Provider: ?Treating Provider/Extender: Fredirick Maudlin ?Stark Klein ?Weeks in Treatment: 5 ?History of Present Illness ?HPI Description: ADMISSION ?04/03/2022 ?This is a 71 year old woman with a past medical history notable for type 1 diabetes mellitus, poorly controlled with her most recent A1c being 8.3, congestive ?heart failure with recent admission for exacerbation secondary to noncompliance with diuretics, as well as history of pontine stroke and oxygen dependence. ?She has wounds on her bilateral lower extremities. She says they have been present for about 2 months and initially started as blisters. She says that she has ?compression stockings, but does not wear them because they are too difficult to get on. ABIs done in clinic were 1.4 and 1.5, suggesting some degree of ?noncompressibility. She also has a wound on her right heel that she says is secondary to it getting bumped frequently as she transfers.  She came to clinic with ?just dry dressings on her legs. She does have home health care, as well as her sister that have been assisting her with her dressings. ?She has multiple wounds. On the left medial calf, just above her ankle, there is a somewhat geographic wound with slough and some epithelialization. On the ?anterior tibial surface and lateral calf, the wounds are quite desiccated with yellow eschar present. On the right anterior tibial surface,  the wounds are gritty with ?thin slough and eschar. The posterior right ankle wound is just above the calcaneus overlying the Achilles and has thick yellow-gray slough and a bit of odor. ?No purulent drainage appreciated. ?04/10/2022: The wounds on her left leg are closed. On the right, the anterior tibial surface wounds are epithelializing and much cleaner. There is just a layer of ?biofilm on the open areas. On the posterior right ankle wound, there is a decreased volume of slough and no odor. We have been using Hydrofera Blue to all ?sites under compression. ?04/17/2022: The wounds on her left leg remained closed and she did bring her juxta lite stockings with her today. On the right, the more distal wound is closed ?and the proximal wound has epithelialized significantly since her last visit. At the posterior right ankle, she still has accumulated some slough and some ?periwound callus but there is no odor or significant drainage. ?04/24/2022: The right anterior leg wounds are closed. She still has a wound on her posterior right ankle that looks about the same as last week. She did not bring ?her right sided juxta light with her today but is wearing the left sided stocking. ?05/01/2022: She has a tiny abrasion on her left anterior tibia which we did not add to her wound count today. The wound on her posterior right ankle is a little bit ?smaller with a little accumulated slough. No drainage or concern for infection. ?05/08/2022: She continues to forget to bring her  juxta lite stocking with her so she remains in a compression wrap despite healing of the right anterior leg ?wounds. The wound on her posterior right ankle/calcaneus is a bit smaller today with just a little bit of accumulated slough. She is wearing a juxta lite stocking ?on the left leg. ?Electronic Signature(s) ?Signed: 05/08/2022 4:27:24 PM By: Fredirick Maudlin MD FACS ?Entered By: Fredirick Maudlin on 05/08/2022 16:27:24 ?-------------------------------------------------------------------------------- ?Physical Exam Details ?Patient Name: Date of Service: ?[name], [name] 05/08/2022 3:00 PM ?Medical Record Number: [id number] ?Patient Account Number: [id number] ?Date of Birth/Sex: Treating RN: ?August 30, 1951 (71 y.o. [name] ?Primary Care Provider: Stark Klein Other Clinician: ?Referring Provider: ?Treating Provider/Extender: Fredirick Maudlin ?Stark Klein ?Weeks in Treatment: 5 ?Constitutional ?. Bradycardic, asymptomatic.. . . No acute distress. ?Respiratory ?Normal work of breathing on supplemental oxygen. ?Notes ?05/08/2022: The wound on her posterior right ankle/calcaneus continues to contract. There is just a small amount of accumulated slough. Underneath this, there ?is a nice granulating surface. ?Electronic Signature(s) ?Signed: 05/08/2022 4:28:15 PM By: Fredirick Maudlin MD FACS ?Entered By: Fredirick Maudlin on 05/08/2022 16:28:15 ?-------------------------------------------------------------------------------- ?Physician Orders Details ?Patient Name: ?Date of Service: ?[name], [name] 05/08/2022 3:00 PM ?Medical Record Number: [id number] ?Patient Account Number: [id number] ?Date of Birth/Sex: ?Treating RN: ?[birth year] (71 y.o. [name] ?Primary Care Provider: Stark Klein ?Other Clinician: ?Referring Provider: ?Treating Provider/Extender: Fredirick Maudlin ?Stark Klein ?Weeks in Treatment: 5 ?Verbal / Phone Orders: No ?Diagnosis Coding ?ICD-10 Coding ?Code  Description ?L97.312 Non-pressure chronic ulcer of right ankle with fat layer exposed ?I89.0 Lymphedema, not elsewhere classified ?Z36.64 Chronic diastolic (congestive) heart failure ?E10.59 Type 1 diabetes mellitus with

## 2022-05-08 NOTE — Progress Notes (Signed)
Referral placed for CCM to assist patient with establishing with counseling for depression symptoms.  ?

## 2022-05-09 ENCOUNTER — Telehealth: Payer: Self-pay | Admitting: *Deleted

## 2022-05-09 NOTE — Chronic Care Management (AMB) (Signed)
?  Care Management  ? ?Outreach Note ? ?05/09/2022 ?Name: [name] MRN: [id number] DOB: 06-08-1951 ? ?Referred by: Eulis Foster, MD ?Reason for referral : Care Coordination (Outreach to schedule referral with BSW ) ? ? ?An unsuccessful telephone outreach was attempted today. The patient was referred to the case management team for assistance with care management and care coordination.  ? ?Follow Up Plan:  ?A HIPAA compliant phone message was left for the patient providing contact information and requesting a return call.  ?The care management team will reach out to the patient again over the next 7 days.  ?If patient returns call to provider office, please advise to call [place]* at [phone].* ? ?[name]  ?Care Guide, Embedded Care Coordination ?[facility]  Care Management  ?Direct Dial: [phone] ? ?

## 2022-05-10 ENCOUNTER — Telehealth: Payer: Self-pay

## 2022-05-10 ENCOUNTER — Ambulatory Visit: Payer: Medicare PPO | Admitting: Licensed Clinical Social Worker

## 2022-05-10 ENCOUNTER — Telehealth: Payer: Self-pay | Admitting: Family Medicine

## 2022-05-10 NOTE — Telephone Encounter (Signed)
The patient would like for Dr. Curt Bears nurse [name] to give her a call back. She has questions about her medication. ?

## 2022-05-10 NOTE — Telephone Encounter (Signed)
Pt calling in stating her Dentist has been trying to get in touch with up b/c she needs an extraction and she is on Eliquis. ?Informed that I do not see where they have contacted [place]. ?Advised to have them fax request on letterhead to [place] and we will address, but also informed pt that we do not stop Eliquis for one tooth extraction. ?Patient verbalized understanding and agreeable to plan.  ? ? ?

## 2022-05-10 NOTE — Chronic Care Management (AMB) (Signed)
?  Care Management  ? ?Note ? ?05/10/2022 ?Name: Jameela Michna MRN: [id number] DOB: 1951/02/27 ? ?[name] Angila Wombles is a 71 y.o. year old female who is a primary care patient of [name], [name], MD. I reached out to [name] by phone today offer care coordination services.  ? ?Ms. Lehane was given information about care management services today including:  ?Care management services include personalized support from designated clinical staff supervised by her physician, including individualized plan of care and coordination with other care providers ?24/7 contact phone numbers for assistance for urgent and routine care needs. ?The patient may stop care management services at any time by phone call to the office staff. ? ?Patient agreed to services and verbal consent obtained.  While on call with patient Arrin Ishler she stated "people are stealing from me and if you wont help me right now I will just go and kill myself" I ask patient if anyone was with her at her house keep her talking ask her not to harm herself I was connecting her with our Education officer, museum. Kept patient on the phone until [place] called .  ? ?Follow up plan: ?Telephone appointment with care management team member scheduled for:05/10/22 ? ?[name]  ?Care Guide, Embedded Care Coordination ?[facility]  Care Management  ?Direct Dial: [phone] ? ?

## 2022-05-10 NOTE — Patient Instructions (Signed)
Visit Information ? ?Instructions: patient will work with SW to address concerns related to Mental Health ? ?Patient was given the following information about care management and care coordination services today, agreed to services, and gave verbal consent: 1.care management/care coordination services include personalized support from designated clinical staff supervised by their physician, including individualized plan of care and coordination with other care providers 2. 24/7 contact phone numbers for assistance for urgent and routine care needs. 3. The patient may stop care management/care coordination services at any time by phone call to the office staff. ? ?Patient verbalizes understanding of instructions and care plan provided today and agrees to view in [facility]. Active MyChart status and patient understanding of how to access instructions and care plan via MyChart confirmed with patient.    ? ?The care management team will reach out to the patient again over the next 7 days.  ? ?[name], BSW  ?Social Worker ?[facility] Care Management  ?[phone] ?  ? ?  ?

## 2022-05-10 NOTE — Chronic Care Management (AMB) (Signed)
  Care Management   Social Work Visit Note  05/10/2022 Name: [name] MRN: [id number] DOB: 1951-06-02  [name] is a 71 y.o. year old female who sees [name], [name] Sheer, MD for primary care. The care management team was consulted for assistance with care management and care coordination needs related to [place] and Resources   Patient was given the following information about care management and care coordination services today, agreed to services, and gave verbal consent: 1.care management/care coordination services include personalized support from designated clinical staff supervised by their physician, including individualized plan of care and coordination with other care providers 2. 24/7 contact phone numbers for assistance for urgent and routine care needs. 3. The patient may stop care management/care coordination services at any time by phone call to the office staff.  Engaged with patient by telephone for initial visit in response to provider referral for social work chronic care management and care coordination services.  Assessment: Review of patient history, allergies, and health status during evaluation of patient need for care management/care coordination services.    Interventions:  Patient interviewed and appropriate assessments performed Collaborated with clinical team regarding patient needs  SW received emergent call. Patient threatening to self-harm by consuming pills. Patient stated she is depressed and in need of therapy. Patient stated she has requested referrals ,but have not received one for a therapist. SW was able to resolve crises and patient agreed not to self harm. SW spoke with family that resided in the home with patient. Patient has family support. Family stated "She is only threatening to self harm because she needs someone to speak with to let her feelings out."  SW reviewed [employer] benefits and located two  therapist for patient to contact. Patient saved information in phone. [name] and [name]. Patient contacted both agency's while SW was on phone.  SW educated patient on calling 911 and / or 70 when in crises. SW had patient contact [name] behavioral health while SW remain on line. SW verified a counselor answered the phone. Patient expressed wanting to self harm. Counselor stated they could speak with patiently immediately. Patient thanked SW and ended call with SW.        Plan:  patient will work with BSW to address needs related to [place] Concerns  Social Worker will follow up within 7-days.   [name], MSW  Social Worker [facility] Care Management  [phone]

## 2022-05-12 ENCOUNTER — Telehealth: Payer: Self-pay | Admitting: *Deleted

## 2022-05-12 NOTE — Telephone Encounter (Signed)
   Pre-operative Risk Assessment    Patient Name: [name]  DOB: 08/02/1951 MRN: [id number]     Request for Surgical Clearance    Procedure:  Dental Extraction - Amount of Teeth to be Pulled:  1 TOOTH TO BE EXTRACTED  Date of Surgery:  Clearance TBD                                 Surgeon:  DR. REHM Surgeon's Group or Practice Name:  [facility], [place] Phone number:  [phone] Fax number:  [phone] ATTN: Judson Roch DeMOSS   Type of Clearance Requested:   - Medical  - Pharmacy:  Hold Apixaban (Eliquis)     Type of Anesthesia:  Local    Additional requests/questions:    Jiles Prows   05/12/2022, 3:08 PM

## 2022-05-12 NOTE — Telephone Encounter (Signed)
   Patient Name: [name]  DOB: [birth year] MRN: [id number]  Primary Cardiologist: Candee Furbish, MD  Chart reviewed as part of pre-operative protocol coverage.   Simple dental extractions (i.e. 1-2 teeth) are considered low risk procedures per guidelines and generally do not require any specific cardiac clearance. It is also generally accepted that for simple extractions and dental cleanings, there is no need to interrupt blood thinner therapy. Patient has history of recurrent strokes so high risk to come off anticoagulation.  SBE prophylaxis is not required for the patient from a cardiac standpoint based on our records.  I will route this recommendation to the requesting party via Epic fax function and remove from pre-op pool.  Please call with questions.  Charlie Pitter, PA-C 05/12/2022, 5:32 PM

## 2022-05-15 ENCOUNTER — Encounter (HOSPITAL_BASED_OUTPATIENT_CLINIC_OR_DEPARTMENT_OTHER): Payer: Medicare PPO | Admitting: General Surgery

## 2022-05-15 DIAGNOSIS — I872 Venous insufficiency (chronic) (peripheral): Secondary | ICD-10-CM | POA: Diagnosis not present

## 2022-05-15 DIAGNOSIS — L97822 Non-pressure chronic ulcer of other part of left lower leg with fat layer exposed: Secondary | ICD-10-CM | POA: Diagnosis not present

## 2022-05-15 DIAGNOSIS — L97412 Non-pressure chronic ulcer of right heel and midfoot with fat layer exposed: Secondary | ICD-10-CM | POA: Diagnosis not present

## 2022-05-15 DIAGNOSIS — L97821 Non-pressure chronic ulcer of other part of left lower leg limited to breakdown of skin: Secondary | ICD-10-CM | POA: Diagnosis not present

## 2022-05-15 DIAGNOSIS — I110 Hypertensive heart disease with heart failure: Secondary | ICD-10-CM | POA: Diagnosis not present

## 2022-05-15 DIAGNOSIS — E10621 Type 1 diabetes mellitus with foot ulcer: Secondary | ICD-10-CM | POA: Diagnosis not present

## 2022-05-15 DIAGNOSIS — Z9981 Dependence on supplemental oxygen: Secondary | ICD-10-CM | POA: Diagnosis not present

## 2022-05-15 DIAGNOSIS — L97811 Non-pressure chronic ulcer of other part of right lower leg limited to breakdown of skin: Secondary | ICD-10-CM | POA: Diagnosis not present

## 2022-05-15 DIAGNOSIS — I890 Lymphedema, not elsewhere classified: Secondary | ICD-10-CM | POA: Diagnosis not present

## 2022-05-15 DIAGNOSIS — E10622 Type 1 diabetes mellitus with other skin ulcer: Secondary | ICD-10-CM | POA: Diagnosis not present

## 2022-05-15 DIAGNOSIS — I5032 Chronic diastolic (congestive) heart failure: Secondary | ICD-10-CM | POA: Diagnosis not present

## 2022-05-15 DIAGNOSIS — E1065 Type 1 diabetes mellitus with hyperglycemia: Secondary | ICD-10-CM | POA: Diagnosis not present

## 2022-05-15 DIAGNOSIS — Z8673 Personal history of transient ischemic attack (TIA), and cerebral infarction without residual deficits: Secondary | ICD-10-CM | POA: Diagnosis not present

## 2022-05-15 NOTE — Progress Notes (Signed)
[name], [name] Estrella [name] ([id number]) Visit Report for 05/15/2022 Chief Complaint Document Details Patient Name: Date of Service: [name], [name] 05/15/2022 3:45 PM Medical Record Number: [id number] Patient Account Number: [id number] Date of Birth/Sex: Treating RN: [birth year] (71 y.o. F) Primary Care Provider: Stark Klein Other Clinician: Referring Provider: Treating Provider/Extender: Delorise Jackson in Treatment: 6 Information Obtained from: Patient Chief Complaint Patient presents for treatment of open ulcers due to venous insufficiency in the setting of poorly controlled DM1 and CHF Electronic Signature(s) Signed: 05/15/2022 4:56:39 PM By: Fredirick Maudlin MD FACS Entered By: Fredirick Maudlin on 05/15/2022 16:56:39 -------------------------------------------------------------------------------- Debridement Details Patient Name: Date of Service: [name], [name]. 05/15/2022 3:45 PM Medical Record Number: [id number] Patient Account Number: [id number] Date of Birth/Sex: Treating RN: Sep 08, 1951 (71 y.o. [name] Primary Care Provider: Stark Klein Other Clinician: Referring Provider: Treating Provider/Extender: Delorise Jackson in Treatment: 6 Debridement Performed for Assessment: Wound #2 Right,Posterior Calcaneus Performed By: Physician Fredirick Maudlin, MD Debridement Type: Debridement Severity of Tissue Pre Debridement: Fat layer exposed Level of Consciousness (Pre-procedure): Awake and Alert Pre-procedure Verification/Time Out Yes - 16:25 Taken: Start Time: 16:25 Pain Control: Lidocaine 4% T opical Solution T Area Debrided (L x W): otal 1.1 (cm) x 0.9 (cm) = 0.99 (cm) Tissue and other material debrided: Viable, Non-Viable, Slough, Subcutaneous, Slough Level: Skin/Subcutaneous Tissue Debridement Description: Excisional Instrument: Curette Bleeding: Minimum Hemostasis Achieved: Pressure Procedural  Pain: 0 Post Procedural Pain: 0 Response to Treatment: Procedure was tolerated well Level of Consciousness (Post- Awake and Alert procedure): Post Debridement Measurements of Total Wound Length: (cm) 1.1 Width: (cm) 0.9 Depth: (cm) 0.1 Volume: (cm) 0.078 Character of Wound/Ulcer Post Debridement: Improved Severity of Tissue Post Debridement: Fat layer exposed Post Procedure Diagnosis Same as Pre-procedure Electronic Signature(s) Signed: 05/15/2022 4:49:57 PM By: Fredirick Maudlin MD FACS Signed: 05/15/2022 5:28:22 PM By: Adline Peals Entered By: Adline Peals on 05/15/2022 16:26:32 -------------------------------------------------------------------------------- Debridement Details Patient Name: Date of Service: [name] SE ETTA [name]. 05/15/2022 3:45 PM Medical Record Number: [id number] Patient Account Number: [id number] Date of Birth/Sex: Treating RN: 11-04-1951 (71 y.o. [name] Primary Care Provider: Stark Klein Other Clinician: Referring Provider: Treating Provider/Extender: Delorise Jackson in Treatment: 6 Debridement Performed for Assessment: Wound #8 Left,Medial Lower Leg Performed By: Physician Fredirick Maudlin, MD Debridement Type: Debridement Level of Consciousness (Pre-procedure): Awake and Alert Pre-procedure Verification/Time Out Yes - 16:25 Taken: Start Time: 16:25 Pain Control: Lidocaine 4% T opical Solution T Area Debrided (L x W): otal 1.5 (cm) x 1.5 (cm) = 2.25 (cm) Tissue and other material debrided: Non-Viable, Skin: Epidermis Level: Skin/Epidermis Debridement Description: Selective/Open Wound Instrument: Curette Bleeding: Minimum Hemostasis Achieved: Pressure Procedural Pain: 0 Post Procedural Pain: 0 Response to Treatment: Procedure was tolerated well Level of Consciousness (Post- Awake and Alert procedure): Post Debridement Measurements of Total Wound Length: (cm) 1.5 Width: (cm) 1.5 Depth:  (cm) 0.1 Volume: (cm) 0.177 Character of Wound/Ulcer Post Debridement: Improved Post Procedure Diagnosis Same as Pre-procedure Electronic Signature(s) Signed: 05/15/2022 4:49:57 PM By: Fredirick Maudlin MD FACS Signed: 05/15/2022 5:28:22 PM By: Adline Peals Entered By: Adline Peals on 05/15/2022 16:27:45 -------------------------------------------------------------------------------- HPI Details Patient Name: Date of Service: [name], [name]. 05/15/2022 3:45 PM Medical Record Number: [id number] Patient Account Number: [id number] Date of Birth/Sex: Treating RN: 12/02/1951 (71 y.o. F) Primary Care Provider: Stark Klein Other Clinician: Referring Provider: Treating Provider/Extender: [name] Weeks in Treatment: 6 History of Present Illness HPI  Description: ADMISSION 04/03/2022 This is a 71 year old woman with a past medical history notable for type 1 diabetes mellitus, poorly controlled with her most recent A1c being 8.3, congestive heart failure with recent admission for exacerbation secondary to noncompliance with diuretics, as well as history of pontine stroke and oxygen dependence. She has wounds on her bilateral lower extremities. She says they have been present for about 2 months and initially started as blisters. She says that she has compression stockings, but does not wear them because they are too difficult to get on. ABIs done in clinic were 1.4 and 1.5, suggesting some degree of noncompressibility. She also has a wound on her right heel that she says is secondary to it getting bumped frequently as she transfers. She came to clinic with just dry dressings on her legs. She does have home health care, as well as her sister that have been assisting her with her dressings. She has multiple wounds. On the left medial calf, just above her ankle, there is a somewhat geographic wound with slough and some epithelialization. On the anterior  tibial surface and lateral calf, the wounds are quite desiccated with yellow eschar present. On the right anterior tibial surface, the wounds are gritty with thin slough and eschar. The posterior right ankle wound is just above the calcaneus overlying the Achilles and has thick yellow-gray slough and a bit of odor. No purulent drainage appreciated. 04/10/2022: The wounds on her left leg are closed. On the right, the anterior tibial surface wounds are epithelializing and much cleaner. There is just a layer of biofilm on the open areas. On the posterior right ankle wound, there is a decreased volume of slough and no odor. We have been using Hydrofera Blue to all sites under compression. 04/17/2022: The wounds on her left leg remained closed and she did bring her juxta lite stockings with her today. On the right, the more distal wound is closed and the proximal wound has epithelialized significantly since her last visit. At the posterior right ankle, she still has accumulated some slough and some periwound callus but there is no odor or significant drainage. 04/24/2022: The right anterior leg wounds are closed. She still has a wound on her posterior right ankle that looks about the same as last week. She did not bring her right sided juxta light with her today but is wearing the left sided stocking. 05/01/2022: She has a tiny abrasion on her left anterior tibia which we did not add to her wound count today. The wound on her posterior right ankle is a little bit smaller with a little accumulated slough. No drainage or concern for infection. 05/08/2022: She continues to forget to bring her juxta lite stocking with her so she remains in a compression wrap despite healing of the right anterior leg wounds. The wound on her posterior right ankle/calcaneus is a bit smaller today with just a little bit of accumulated slough. She is wearing a juxta lite stocking on the left leg. 05/15/2022: She says that she has been  wearing her juxta lite stockings at home, but today she is wearing regular compression stockings that she purchased from [employer]; the juxta lite stockings are in the laundry. The wound on her posterior right ankle/calcaneus continues to contract and has just a little bit of accumulated slough. Unfortunately, her edema control is suboptimal on the left and she has developed several small blisters with a larger 1 at the medial calf. Electronic Signature(s) Signed: 05/15/2022 4:57:56 PM By:  Fredirick Maudlin MD FACS Entered By: Fredirick Maudlin on 05/15/2022 16:57:56 -------------------------------------------------------------------------------- Physical Exam Details Patient Name: Date of Service: GENIYAH, EISCHEID 05/15/2022 3:45 PM Medical Record Number: [id number] Patient Account Number: [id number] Date of Birth/Sex: Treating RN: 12-31-1950 (71 y.o. F) Primary Care Provider: Stark Klein Other Clinician: Referring Provider: Treating Provider/Extender: [name] Weeks in Treatment: 6 Constitutional Slightly hypertensive. Bradycardic, asymptomatic.. . . No acute distress. Respiratory Normal work of breathing on supplemental oxygen. Cardiovascular She continues to have 3+ nonpitting edema in the bilateral lower extremities. The skin is warm and erythematous.. Notes 05/15/2022: The wound on her posterior right ankle/calcaneus continues to contract and has just a little bit of accumulated slough. Unfortunately, her edema control is suboptimal on the left and she has developed several small blisters with a larger 1 at the medial calf. Electronic Signature(s) Signed: 05/15/2022 4:59:39 PM By: Fredirick Maudlin MD FACS Entered By: Fredirick Maudlin on 05/15/2022 16:59:39 -------------------------------------------------------------------------------- Physician Orders Details Patient Name: Date of Service: [name], [name]. 05/15/2022 3:45 PM Medical Record  Number: [id number] Patient Account Number: [id number] Date of Birth/Sex: Treating RN: 06/17/1951 (71 y.o. [name] Primary Care Provider: Stark Klein Other Clinician: Referring Provider: Treating Provider/Extender: Delorise Jackson in Treatment: 6 Verbal / Phone Orders: No Diagnosis Coding ICD-10 Coding Code Description [phone] Non-pressure chronic ulcer of right ankle with fat layer exposed I89.0 Lymphedema, not elsewhere classified S93.73 Chronic diastolic (congestive) heart failure E10.59 Type 1 diabetes mellitus with other circulatory complications S28.76 Type 1 diabetes mellitus with diabetic polyneuropathy Z68.41 Body mass index [BMI]40.0-44.9, adult E10.622 Type 1 diabetes mellitus with other skin ulcer L97.821 Non-pressure chronic ulcer of other part of left lower leg limited to breakdown of skin Follow-up Appointments ppointment in 1 week. - Dr. Celine Ahr Room 1 - 5/30 at 2:45 Return A Bathing/ Shower/ Hygiene May shower and wash wound with soap and water. - with dressing changes Edema Control - Lymphedema / SCD / Other Bilateral Lower Extremities Elevate legs to the level of the heart or above for 30 minutes daily and/or when sitting, a frequency of: - throughout the day Patient to wear own compression stockings every day. - juxtalites to both legs Exercise regularly [facility] wound care orders this week; continue Home Health for wound care. May utilize formulary equivalent dressing for wound treatment orders unless otherwise specified. Other Home Health Orders/Instructions: - [facility] Wound Treatment Wound #2 - Calcaneus Wound Laterality: Right, Posterior Cleanser: Soap and Water Every Other Day/30 Days Discharge Instructions: May shower and wash wound with dial antibacterial soap and water prior to dressing change. Peri-Wound Care: Sween Lotion (Moisturizing lotion) Every Other Day/30 Days Discharge Instructions: Apply  moisturizing lotion as directed Prim Dressing: Hydrofera Blue Classic Foam, 4x4 in (Generic) Every Other Day/30 Days ary Discharge Instructions: Moisten with saline prior to applying to wound bed Secondary Dressing: ALLEVYN Heel 4 1/2in x 5 1/2in / 10.5cm x 13.5cm (Generic) Every Other Day/30 Days Discharge Instructions: Apply over primary dressing as directed. Secondary Dressing: Woven Gauze Sponge, Non-Sterile 4x4 in (Generic) Every Other Day/30 Days Discharge Instructions: Apply over primary dressing as directed. Secured With: 43M Medipore Public affairs consultant Surgical T 2x10 (in/yd) (Generic) Every Other Day/30 Days ape Discharge Instructions: Secure with tape as directed. Wound #8 - Lower Leg Wound Laterality: Left, Medial Cleanser: Soap and Water Every Other Day/30 Days Discharge Instructions: May shower and wash wound with dial antibacterial soap and water prior to dressing change. Cleanser: Wound  Cleanser Every Other Day/30 Days Discharge Instructions: Cleanse the wound with wound cleanser prior to applying a clean dressing using gauze sponges, not tissue or cotton balls. Prim Dressing: Hydrofera Blue Classic Foam, 2x2 in Every Other Day/30 Days ary Discharge Instructions: Moisten with saline prior to applying to wound bed Secondary Dressing: Bordered Gauze, 4x4 in Every Other Day/30 Days Discharge Instructions: Apply over primary dressing as directed. Electronic Signature(s) Signed: 05/15/2022 5:12:45 PM By: Fredirick Maudlin MD FACS Previous Signature: 05/15/2022 4:49:57 PM Version By: Fredirick Maudlin MD FACS Entered By: Fredirick Maudlin on 05/15/2022 16:59:59 -------------------------------------------------------------------------------- Problem List Details Patient Name: Date of Service: [name], [name]. 05/15/2022 3:45 PM Medical Record Number: [id number] Patient Account Number: [id number] Date of Birth/Sex: Treating RN: [birth year] (71 y.o. F) Primary Care Provider: Stark Klein Other Clinician: Referring Provider: Treating Provider/Extender: [name] Weeks in Treatment: 6 Active Problems ICD-10 Encounter Code Description Active Date MDM Diagnosis L97.312 Non-pressure chronic ulcer of right ankle with fat layer exposed 04/03/2022 No Yes I89.0 Lymphedema, not elsewhere classified 04/03/2022 No Yes X64.68 Chronic diastolic (congestive) heart failure 04/03/2022 No Yes E10.59 Type 1 diabetes mellitus with other circulatory complications 0/32/1224 No Yes E10.42 Type 1 diabetes mellitus with diabetic polyneuropathy 04/03/2022 No Yes Z68.41 Body mass index [BMI]40.0-44.9, adult 04/03/2022 No Yes E10.622 Type 1 diabetes mellitus with other skin ulcer 04/03/2022 No Yes L97.821 Non-pressure chronic ulcer of other part of left lower leg limited to breakdown 05/15/2022 No Yes of skin Inactive Problems ICD-10 Code Description Active Date Inactive Date L97.212 Non-pressure chronic ulcer of right calf with fat layer exposed 04/03/2022 04/03/2022 L97.222 Non-pressure chronic ulcer of left calf with fat layer exposed 04/03/2022 04/03/2022 Resolved Problems Electronic Signature(s) Signed: 05/15/2022 4:54:36 PM By: Fredirick Maudlin MD FACS Entered By: Fredirick Maudlin on 05/15/2022 16:54:36 -------------------------------------------------------------------------------- Progress Note Details Patient Name: Date of Service: [name], [name]. 05/15/2022 3:45 PM Medical Record Number: [id number] Patient Account Number: [id number] Date of Birth/Sex: Treating RN: 05/09/1951 (71 y.o. F) Primary Care Provider: Stark Klein Other Clinician: Referring Provider: Treating Provider/Extender: Delorise Jackson in Treatment: 6 Subjective Chief Complaint Information obtained from Patient Patient presents for treatment of open ulcers due to venous insufficiency in the setting of poorly controlled DM1 and CHF History of Present Illness  (HPI) ADMISSION 04/03/2022 This is a 71 year old woman with a past medical history notable for type 1 diabetes mellitus, poorly controlled with her most recent A1c being 8.3, congestive heart failure with recent admission for exacerbation secondary to noncompliance with diuretics, as well as history of pontine stroke and oxygen dependence. She has wounds on her bilateral lower extremities. She says they have been present for about 2 months and initially started as blisters. She says that she has compression stockings, but does not wear them because they are too difficult to get on. ABIs done in clinic were 1.4 and 1.5, suggesting some degree of noncompressibility. She also has a wound on her right heel that she says is secondary to it getting bumped frequently as she transfers. She came to clinic with just dry dressings on her legs. She does have home health care, as well as her sister that have been assisting her with her dressings. She has multiple wounds. On the left medial calf, just above her ankle, there is a somewhat geographic wound with slough and some epithelialization. On the anterior tibial surface and lateral calf, the wounds are quite desiccated with yellow eschar present. On the right anterior  tibial surface, the wounds are gritty with thin slough and eschar. The posterior right ankle wound is just above the calcaneus overlying the Achilles and has thick yellow-gray slough and a bit of odor. No purulent drainage appreciated. 04/10/2022: The wounds on her left leg are closed. On the right, the anterior tibial surface wounds are epithelializing and much cleaner. There is just a layer of biofilm on the open areas. On the posterior right ankle wound, there is a decreased volume of slough and no odor. We have been using Hydrofera Blue to all sites under compression. 04/17/2022: The wounds on her left leg remained closed and she did bring her juxta lite stockings with her today. On the right,  the more distal wound is closed and the proximal wound has epithelialized significantly since her last visit. At the posterior right ankle, she still has accumulated some slough and some periwound callus but there is no odor or significant drainage. 04/24/2022: The right anterior leg wounds are closed. She still has a wound on her posterior right ankle that looks about the same as last week. She did not bring her right sided juxta light with her today but is wearing the left sided stocking. 05/01/2022: She has a tiny abrasion on her left anterior tibia which we did not add to her wound count today. The wound on her posterior right ankle is a little bit smaller with a little accumulated slough. No drainage or concern for infection. 05/08/2022: She continues to forget to bring her juxta lite stocking with her so she remains in a compression wrap despite healing of the right anterior leg wounds. The wound on her posterior right ankle/calcaneus is a bit smaller today with just a little bit of accumulated slough. She is wearing a juxta lite stocking on the left leg. 05/15/2022: She says that she has been wearing her juxta lite stockings at home, but today she is wearing regular compression stockings that she purchased from [employer]; the juxta lite stockings are in the laundry. The wound on her posterior right ankle/calcaneus continues to contract and has just a little bit of accumulated slough. Unfortunately, her edema control is suboptimal on the left and she has developed several small blisters with a larger 1 at the medial calf. Patient History Information obtained from Patient. Family History Heart Disease - Mother, Hypertension - Mother,Father, Stroke - Child. Social History Never smoker, Marital Status - Divorced, Alcohol Use - Never, Drug Use - No History, Caffeine Use - Never. Medical History Eyes Patient has history of Cataracts Hematologic/Lymphatic Patient has history of  Anemia Respiratory Patient has history of Sleep Apnea Cardiovascular Patient has history of Congestive Heart Failure, Hypertension Endocrine Patient has history of Type I Diabetes Oncologic Patient has history of Received Radiation Hospitalization/Surgery History - pacemaker implant. - loop recorder insertion and removal. - esophagogastroduodenoscopy. - TEE without cardioversion. - colonoscopy. - left and right heart catheterization. - abdominal angiogram. - thyroidectomy. - abdominal hysterectomy. - ankle fracture surgery. Medical A Surgical History Notes nd Ear/Nose/Mouth/Throat edentulous, GERD, seasonal allergies, trouble swallowing Hematologic/Lymphatic hyperlipidemia Cardiovascular complete heart block, bradycardia, paroxysmal atrial fibrillation, pacemaker Gastrointestinal hemorrhoids Endocrine hypothyroidism Genitourinary proteinuria, frequent UTIs Musculoskeletal decreased ROM L shoulder, hemiparesis affecting L side, arthritis Neurologic benign paroxysmal positional vertigo, intracranial vascular stenosis, lacunar infarction, stroke Oncologic thyroid cancer Psychiatric depression Objective Constitutional Slightly hypertensive. Bradycardic, asymptomatic.Marland Kitchen No acute distress. Vitals Time Taken: 4:09 PM, Height: 67 in, Weight: 243 lbs, BMI: 38.1, Temperature: 98.3 F, Pulse: 55 bpm, Respiratory Rate: 18 breaths/min,  Blood Pressure: 150/53 mmHg. Respiratory Normal work of breathing on supplemental oxygen. Cardiovascular She continues to have 3+ nonpitting edema in the bilateral lower extremities. The skin is warm and erythematous.. General Notes: 05/15/2022: The wound on her posterior right ankle/calcaneus continues to contract and has just a little bit of accumulated slough. Unfortunately, her edema control is suboptimal on the left and she has developed several small blisters with a larger 1 at the medial calf. Integumentary (Hair, Skin) Wound #2 status is Open.  Original cause of wound was Gradually Appeared. The date acquired was: 01/25/2022. The wound has been in treatment 6 weeks. The wound is located on the Right,Posterior Calcaneus. The wound measures 1.1cm length x 0.9cm width x 0.1cm depth; 0.778cm^2 area and 0.078cm^3 volume. There is Fat Layer (Subcutaneous Tissue) exposed. There is no tunneling or undermining noted. There is a medium amount of serosanguineous drainage noted. The wound margin is distinct with the outline attached to the wound base. There is large (67-100%) red, pink granulation within the wound bed. There is a small (1-33%) amount of necrotic tissue within the wound bed including Adherent Slough. Wound #8 status is Open. Original cause of wound was Blister. The date acquired was: 05/15/2022. The wound is located on the Left,Medial Lower Leg. The wound measures 1.5cm length x 1.5cm width x 0.1cm depth; 1.767cm^2 area and 0.177cm^3 volume. There is Fat Layer (Subcutaneous Tissue) exposed. There is no tunneling or undermining noted. There is a medium amount of serosanguineous drainage noted. The wound margin is distinct with the outline attached to the wound base. There is large (67-100%) red, pink granulation within the wound bed. There is a small (1-33%) amount of necrotic tissue within the wound bed. Assessment Active Problems ICD-10 Non-pressure chronic ulcer of right ankle with fat layer exposed Lymphedema, not elsewhere classified Chronic diastolic (congestive) heart failure Type 1 diabetes mellitus with other circulatory complications Type 1 diabetes mellitus with diabetic polyneuropathy Body mass index [BMI]40.0-44.9, adult Type 1 diabetes mellitus with other skin ulcer Non-pressure chronic ulcer of other part of left lower leg limited to breakdown of skin Procedures Wound #2 Pre-procedure diagnosis of Wound #2 is a Diabetic Wound/Ulcer of the Lower Extremity located on the Right,Posterior Calcaneus .Severity of Tissue  Pre Debridement is: Fat layer exposed. There was a Excisional Skin/Subcutaneous Tissue Debridement with a total area of 0.99 sq cm performed by Fredirick Maudlin, MD. With the following instrument(s): Curette to remove Viable and Non-Viable tissue/material. Material removed includes Subcutaneous Tissue and Slough and after achieving pain control using Lidocaine 4% T opical Solution. No specimens were taken. A time out was conducted at 16:25, prior to the start of the procedure. A Minimum amount of bleeding was controlled with Pressure. The procedure was tolerated well with a pain level of 0 throughout and a pain level of 0 following the procedure. Post Debridement Measurements: 1.1cm length x 0.9cm width x 0.1cm depth; 0.078cm^3 volume. Character of Wound/Ulcer Post Debridement is improved. Severity of Tissue Post Debridement is: Fat layer exposed. Post procedure Diagnosis Wound #2: Same as Pre-Procedure Wound #8 Pre-procedure diagnosis of Wound #8 is a Lymphedema located on the Left,Medial Lower Leg . There was a Selective/Open Wound Skin/Epidermis Debridement with a total area of 2.25 sq cm performed by Fredirick Maudlin, MD. With the following instrument(s): Curette to remove Non-Viable tissue/material. Material removed includes Skin: Epidermis after achieving pain control using Lidocaine 4% Topical Solution. No specimens were taken. A time out was conducted at 16:25, prior to the start  of the procedure. A Minimum amount of bleeding was controlled with Pressure. The procedure was tolerated well with a pain level of 0 throughout and a pain level of 0 following the procedure. Post Debridement Measurements: 1.5cm length x 1.5cm width x 0.1cm depth; 0.177cm^3 volume. Character of Wound/Ulcer Post Debridement is improved. Post procedure Diagnosis Wound #8: Same as Pre-Procedure Plan Follow-up Appointments: Return Appointment in 1 week. - Dr. Celine Ahr Room 1 - 5/30 at 2:45 Bathing/ Shower/ Hygiene: May  shower and wash wound with soap and water. - with dressing changes Edema Control - Lymphedema / SCD / Other: Elevate legs to the level of the heart or above for 30 minutes daily and/or when sitting, a frequency of: - throughout the day Patient to wear own compression stockings every day. - juxtalites to both legs Exercise regularly Home Health: New wound care orders this week; continue Home Health for wound care. May utilize formulary equivalent dressing for wound treatment orders unless otherwise specified. Other Home Health Orders/Instructions: Jackqul[facility] [place] WOUND #2: - Calcaneus Wound Laterality: Right, Posterior Cleanser: Soap and Water Every Other Day/30 Days Discharge Instructions: May shower and wash wound with dial antibacterial soap and water prior to dressing change. Peri-Wound Care: Sween Lotion (Moisturizing lotion) Every Other Day/30 Days Discharge Instructions: Apply moisturizing lotion as directed Prim Dressing: Hydrofera Blue Classic Foam, 4x4 in (Generic) Every Other Day/30 Days ary Discharge Instructions: Moisten with saline prior to applying to wound bed Secondary Dressing: ALLEVYN Heel 4 1/2in x 5 1/2in / 10.5cm x 13.5cm (Generic) Every Other Day/30 Days Discharge Instructions: Apply over primary dressing as directed. Secondary Dressing: Woven Gauze Sponge, Non-Sterile 4x4 in (Generic) Every Other Day/30 Days Discharge Instructions: Apply over primary dressing as directed. Secured With: 41M Medipore Public affairs consultant Surgical T 2x10 (in/yd) (Generic) Every Other Day/30 Days ape Discharge Instructions: Secure with tape as directed. WOUND #8: - Lower Leg Wound Laterality: Left, Medial Cleanser: Soap and Water Every Other Day/30 Days Discharge Instructions: May shower and wash wound with dial antibacterial soap and water prior to dressing change. Cleanser: Wound Cleanser Every Other Day/30 Days Discharge Instructions: Cleanse the wound with wound cleanser prior to applying a clean  dressing using gauze sponges, not tissue or cotton balls. Prim Dressing: Hydrofera Blue Classic Foam, 2x2 in Every Other Day/30 Days ary Discharge Instructions: Moisten with saline prior to applying to wound bed Secondary Dressing: Bordered Gauze, 4x4 in Every Other Day/30 Days Discharge Instructions: Apply over primary dressing as directed. 05/15/2022: The wound on her posterior right ankle/calcaneus continues to contract and has just a little bit of accumulated slough. Unfortunately, her edema control is suboptimal on the left and she has developed several small blisters with a larger 1 at the medial calf. I used a curette to debride the slough from the calcaneal wound. I unroofed the largest of the blisters on her left leg; the others are tiny and I left them alone. We will continue to use Hydrofera Blue to the heel and also add this to the left calf wound. She was advised to wear her juxta lite stockings and look for compression stockings with 20-30 mmHg compression if she wants to purchase some from [employer]. I will see her back in 1 week. Electronic Signature(s) Signed: 05/15/2022 5:01:07 PM By: Fredirick Maudlin MD FACS Entered By: Fredirick Maudlin on 05/15/2022 17:01:07 -------------------------------------------------------------------------------- HxROS Details Patient Name: Date of Service: Bayne, [name]. 05/15/2022 3:45 PM Medical Record Number: [id number] Patient Account Number: [id number] Date of Birth/Sex: Treating  RN: Oct 17, 1951 (71 y.o. F) Primary Care Provider: Stark Klein Other Clinician: Referring Provider: Treating Provider/Extender: Delorise Jackson in Treatment: 6 Information Obtained From Patient Eyes Medical History: Positive for: Cataracts Ear/Nose/Mouth/Throat Medical History: Past Medical History Notes: edentulous, GERD, seasonal allergies, trouble swallowing Hematologic/Lymphatic Medical History: Positive for:  Anemia Past Medical History Notes: hyperlipidemia Respiratory Medical History: Positive for: Sleep Apnea Cardiovascular Medical History: Positive for: Congestive Heart Failure; Hypertension Past Medical History Notes: complete heart block, bradycardia, paroxysmal atrial fibrillation, pacemaker Gastrointestinal Medical History: Past Medical History Notes: hemorrhoids Endocrine Medical History: Positive for: Type I Diabetes Past Medical History Notes: hypothyroidism Time with diabetes: 55 yrs Treated with: Insulin Blood sugar tested every day: Yes Tested : on pump Genitourinary Medical History: Past Medical History Notes: proteinuria, frequent UTIs Musculoskeletal Medical History: Past Medical History Notes: decreased ROM L shoulder, hemiparesis affecting L side, arthritis Neurologic Medical History: Past Medical History Notes: benign paroxysmal positional vertigo, intracranial vascular stenosis, lacunar infarction, stroke Oncologic Medical History: Positive for: Received Radiation Past Medical History Notes: thyroid cancer Psychiatric Medical History: Past Medical History Notes: depression HBO Extended History Items Eyes: Cataracts Immunizations Pneumococcal Vaccine: Received Pneumococcal Vaccination: Yes Received Pneumococcal Vaccination On or After 60th Birthday: Yes Implantable Devices Yes Hospitalization / Surgery History Type of Hospitalization/Surgery pacemaker implant loop recorder insertion and removal esophagogastroduodenoscopy TEE without cardioversion colonoscopy left and right heart catheterization abdominal angiogram thyroidectomy abdominal hysterectomy ankle fracture surgery Family and Social History Heart Disease: Yes - Mother; Hypertension: Yes - Mother,Father; Stroke: Yes - Child; Never smoker; Marital Status - Divorced; Alcohol Use: Never; Drug Use: No History; Caffeine Use: Never; Financial Concerns: No; Food, Clothing or Shelter  Needs: No; Support System Lacking: No; Transportation Concerns: No Electronic Signature(s) Signed: 05/15/2022 5:12:45 PM By: Fredirick Maudlin MD FACS Entered By: Fredirick Maudlin on 05/15/2022 16:58:48 -------------------------------------------------------------------------------- SuperBill Details Patient Name: Date of Service: [name], [name]. 05/15/2022 Medical Record Number: [id number] Patient Account Number: [id number] Date of Birth/Sex: Treating RN: 03/28/1951 (71 y.o. F) Primary Care Provider: Stark Klein Other Clinician: Referring Provider: Treating Provider/Extender: [name] Weeks in Treatment: 6 Diagnosis Coding ICD-10 Codes Code Description [phone] Non-pressure chronic ulcer of right ankle with fat layer exposed I89.0 Lymphedema, not elsewhere classified S85.46 Chronic diastolic (congestive) heart failure E10.59 Type 1 diabetes mellitus with other circulatory complications E70.35 Type 1 diabetes mellitus with diabetic polyneuropathy Z68.41 Body mass index [BMI]40.0-44.9, adult E10.622 Type 1 diabetes mellitus with other skin ulcer L97.821 Non-pressure chronic ulcer of other part of left lower leg limited to breakdown of skin Facility Procedures CPT4 Code: [id number] Description: 11042 - DEB SUBQ TISSUE 20 SQ CM/< ICD-10 Diagnosis Description L97.312 Non-pressure chronic ulcer of right ankle with fat layer exposed Modifier: Quantity: 1 CPT4 Code: [id number] Description: 78938 - DEBRIDE WOUND 1ST 20 SQ CM OR < ICD-10 Diagnosis Description L97.821 Non-pressure chronic ulcer of other part of left lower leg limited to breakdown o Modifier: f skin Quantity: 1 Physician Procedures : CPT4 Code Description Modifier [id number] 99213 - WC PHYS LEVEL 3 - EST PT 25 ICD-10 Diagnosis Description L97.312 Non-pressure chronic ulcer of right ankle with fat layer exposed L97.821 Non-pressure chronic ulcer of other part of left lower leg limited  to  breakdown of skin I89.0 Lymphedema, not elsewhere classified E10.622 Type 1 diabetes mellitus with other skin ulcer Quantity: 1 : [id number] 11042 - WC PHYS SUBQ TISS 20 SQ CM ICD-10 Diagnosis Description L97.312 Non-pressure chronic ulcer of right ankle with fat layer exposed  Quantity: 1 : [id number] 84696 - WC PHYS DEBR WO ANESTH 20 SQ CM ICD-10 Diagnosis Description L97.821 Non-pressure chronic ulcer of other part of left lower leg limited to breakdown of skin Quantity: 1 Electronic Signature(s) Signed: 05/15/2022 5:02:51 PM By: Fredirick Maudlin MD FACS Entered By: Fredirick Maudlin on 05/15/2022 17:02:51

## 2022-05-16 DIAGNOSIS — R600 Localized edema: Secondary | ICD-10-CM | POA: Diagnosis not present

## 2022-05-16 DIAGNOSIS — I69354 Hemiplegia and hemiparesis following cerebral infarction affecting left non-dominant side: Secondary | ICD-10-CM | POA: Diagnosis not present

## 2022-05-16 DIAGNOSIS — R296 Repeated falls: Secondary | ICD-10-CM | POA: Diagnosis not present

## 2022-05-16 DIAGNOSIS — I509 Heart failure, unspecified: Secondary | ICD-10-CM | POA: Diagnosis not present

## 2022-05-16 NOTE — Progress Notes (Signed)
Gatchel, [name] ([id number]) Visit Report for 05/15/2022 Arrival Information Details Patient Name: Date of Service: [place], [name] 05/15/2022 3:45 PM Medical Record Number: [id number] Patient Account Number: [id number] Date of Birth/Sex: Treating RN: Apr 16, 1951 (71 y.o. [name] Primary Care Timo Hartwig: Stark Klein Other Clinician: Referring Edrian Melucci: Treating Theresea Trautmann/Extender: Delorise Jackson in Treatment: 6 Visit Information History Since Last Visit Added or deleted any medications: No Patient Arrived: Wheel Chair Any new allergies or adverse reactions: No Arrival Time: 16:04 Had a fall or experienced change in No Accompanied By: self activities of daily living that may affect Transfer Assistance: Manual risk of falls: Patient Identification Verified: Yes Signs or symptoms of abuse/neglect since last visito No Secondary Verification Process Completed: Yes Hospitalized since last visit: No Patient Requires Transmission-Based Precautions: No Implantable device outside of the clinic excluding No Patient Has Alerts: Yes cellular tissue based products placed in the center Patient Alerts: Patient on Blood Thinner since last visit: pacemaker Has Dressing in Place as Prescribed: Yes Pain Present Now: No Electronic Signature(s) Signed: 05/15/2022 5:28:22 PM By: Adline Peals Entered By: Adline Peals on 05/15/2022 16:04:52 -------------------------------------------------------------------------------- Encounter Discharge Information Details Patient Name: Date of Service: [name], [name] 05/15/2022 3:45 PM Medical Record Number: [id number] Patient Account Number: [id number] Date of Birth/Sex: Treating RN: [birth year] (71 y.o. [name] Primary Care Neo Yepiz: Stark Klein Other Clinician: Referring Gordie Belvin: Treating Maclovia Uher/Extender: Delorise Jackson in Treatment: 6 Encounter  Discharge Information Items Post Procedure Vitals Discharge Condition: Stable Temperature (F): 98.3 Ambulatory Status: Wheelchair Pulse (bpm): 55 Discharge Destination: Home Respiratory Rate (breaths/min): 18 Transportation: Private Auto Blood Pressure (mmHg): 150/53 Accompanied By: self Schedule Follow-up Appointment: Yes Clinical Summary of Care: Patient Declined Electronic Signature(s) Signed: 05/15/2022 5:28:22 PM By: Adline Peals Entered By: Adline Peals on 05/15/2022 16:54:46 -------------------------------------------------------------------------------- Lower Extremity Assessment Details Patient Name: Date of Service: Greta Doom M. 05/15/2022 3:45 PM Medical Record Number: [id number] Patient Account Number: [id number] Date of Birth/Sex: Treating RN: Dec 23, 1951 (71 y.o. [name] Primary Care Kimani Bedoya: Stark Klein Other Clinician: Referring Rickie Gutierres: Treating Amamda Curbow/Extender: [name] Weeks in Treatment: 6 Edema Assessment Assessed: [Left: No] [Right: No] E[Left: dema] [Right: :] Calf Left: Right: Point of Measurement: From Medial Instep 39 cm Ankle Left: Right: Point of Measurement: From Medial Instep 24.3 cm Vascular Assessment Pulses: Dorsalis Pedis Palpable: [Right:Yes] Electronic Signature(s) Signed: 05/15/2022 5:28:22 PM By: Adline Peals Entered By: Adline Peals on 05/15/2022 16:11:13 -------------------------------------------------------------------------------- Multi Wound Chart Details Patient Name: Date of Service: [name] 05/15/2022 3:45 PM Medical Record Number: [id number] Patient Account Number: [id number] Date of Birth/Sex: Treating RN: [birth year] (71 y.o. F) Primary Care Hutton Pellicane: Stark Klein Other Clinician: Referring Hedi Barkan: Treating Jackee Glasner/Extender: [name] Weeks in Treatment: 6 Vital Signs Height(in):  18 Pulse(bpm): 72 Weight(lbs): 243 Blood Pressure(mmHg): 150/53 Body Mass Index(BMI): 38.1 Temperature(F): 98.3 Respiratory Rate(breaths/min): 18 Photos: [2:Right, Posterior Calcaneus] [8:No Photos Left, Medial Lower Leg] [N/A:N/A N/A] Wound Location: [2:Gradually Appeared] [8:Blister] [N/A:N/A] Wounding Event: [2:Diabetic Wound/Ulcer of the Lower] [8:Lymphedema] [N/A:N/A] Primary Etiology: [2:Extremity Cataracts, Anemia, Sleep Apnea,] [8:Cataracts, Anemia, Sleep Apnea,] [N/A:N/A] Comorbid History: [2:Congestive Heart Failure, Hypertension, Type I Diabetes, Received Radiation 01/25/2022] [8:Congestive Heart Failure, Hypertension, Type I Diabetes, Received Radiation 05/15/2022] [N/A:N/A] Date Acquired: [2:6] [8:0] [N/A:N/A] Weeks of Treatment: [2:Open] [8:Open] [N/A:N/A] Wound Status: [2:No] [8:No] [N/A:N/A] Wound Recurrence: [2:1.1x0.9x0.1] [8:1.5x1.5x0.1] [N/A:N/A] Measurements L x W x D (cm) [2:0.778] [8:1.767] [N/A:N/A] A (cm) :  rea [2:0.078] [8:0.177] [N/A:N/A] Volume (cm) : [2:45.00%] [8:-125.10%] [N/A:N/A] % Reduction in A [2:rea: 72.40%] [8:-124.10%] [N/A:N/A] % Reduction in Volume: [2:Grade 2] [8:Full Thickness Without Exposed] [N/A:N/A] Classification: [2:Medium] [8:Support Structures Medium] [N/A:N/A] Exudate A mount: [2:Serosanguineous] [8:Serosanguineous] [N/A:N/A] Exudate Type: [2:red, brown] [8:red, brown] [N/A:N/A] Exudate Color: [2:Distinct, outline attached] [8:Distinct, outline attached] [N/A:N/A] Wound Margin: [2:Large (67-100%)] [8:Large (67-100%)] [N/A:N/A] Granulation A mount: [2:Red, Pink] [8:Red, Pink] [N/A:N/A] Granulation Quality: [2:Small (1-33%)] [8:Small (1-33%)] [N/A:N/A] Necrotic A mount: [2:Fat Layer (Subcutaneous Tissue): Yes Fat Layer (Subcutaneous Tissue): Yes N/A] Exposed Structures: [2:Fascia: No Tendon: No Muscle: No Joint: No Bone: No Medium (34-66%)] [8:Fascia: No Tendon: No Muscle: No Joint: No Bone: No Small (1-33%)]  [N/A:N/A] Epithelialization: [2:Debridement - Excisional] [8:Debridement - Selective/Open Wound N/A] Debridement: Pre-procedure Verification/Time Out 16:25 [8:16:25] [N/A:N/A] Taken: [2:Lidocaine 4% Topical Solution] [8:Lidocaine 4% Topical Solution] [N/A:N/A] Pain Control: [2:Subcutaneous, Slough] [8:N/A] [N/A:N/A] Tissue Debrided: [2:Skin/Subcutaneous Tissue] [8:Skin/Epidermis] [N/A:N/A] Level: [2:0.99] [8:2.25] [N/A:N/A] Debridement A (sq cm): [2:rea Curette] [8:Curette] [N/A:N/A] Instrument: [2:Minimum] [8:Minimum] [N/A:N/A] Bleeding: [2:Pressure] [8:Pressure] [N/A:N/A] Hemostasis A chieved: [2:0] [8:0] [N/A:N/A] Procedural Pain: [2:0] [8:0] [N/A:N/A] Post Procedural Pain: [2:Procedure was tolerated well] [8:Procedure was tolerated well] [N/A:N/A] Debridement Treatment Response: [2:1.1x0.9x0.1] [8:1.5x1.5x0.1] [N/A:N/A] Post Debridement Measurements L x W x D (cm) [2:0.078] [8:0.177] [N/A:N/A] Post Debridement Volume: (cm) [2:Debridement] [8:Debridement] [N/A:N/A] Treatment Notes Wound #2 (Calcaneus) Wound Laterality: Right, Posterior Cleanser Soap and Water Discharge Instruction: May shower and wash wound with dial antibacterial soap and water prior to dressing change. Peri-Wound Care Sween Lotion (Moisturizing lotion) Discharge Instruction: Apply moisturizing lotion as directed Topical Primary Dressing Hydrofera Blue Classic Foam, 4x4 in Discharge Instruction: Moisten with saline prior to applying to wound bed Secondary Dressing ALLEVYN Heel 4 1/2in x 5 1/2in / 10.5cm x 13.5cm Discharge Instruction: Apply over primary dressing as directed. Woven Gauze Sponge, Non-Sterile 4x4 in Discharge Instruction: Apply over primary dressing as directed. Secured With [employer] Surgical T 2x10 (in/yd) ape Discharge Instruction: Secure with tape as directed. Compression Wrap Compression Stockings Add-Ons Wound #8 (Lower Leg) Wound Laterality: Left, Medial Cleanser Soap  and Water Discharge Instruction: May shower and wash wound with dial antibacterial soap and water prior to dressing change. Wound Cleanser Discharge Instruction: Cleanse the wound with wound cleanser prior to applying a clean dressing using gauze sponges, not tissue or cotton balls. Peri-Wound Care Topical Primary Dressing Hydrofera Blue Classic Foam, 2x2 in Discharge Instruction: Moisten with saline prior to applying to wound bed Secondary Dressing Bordered Gauze, 4x4 in Discharge Instruction: Apply over primary dressing as directed. Secured With Compression Wrap Compression Stockings Environmental education officer) Signed: 05/15/2022 4:56:33 PM By: Fredirick Maudlin MD FACS Entered By: Fredirick Maudlin on 05/15/2022 16:56:33 -------------------------------------------------------------------------------- Multi-Disciplinary Care Plan Details Patient Name: Date of Service: [name], RO [place] [name] 05/15/2022 3:45 PM Medical Record Number: [id number] Patient Account Number: [id number] Date of Birth/Sex: Treating RN: 12-06-1951 (71 y.o. [name] Primary Care Maurene Hollin: Stark Klein Other Clinician: Referring Sherrel Shafer: Treating Zuleima Haser/Extender: Delorise Jackson in Treatment: 6 Multidisciplinary Care Plan reviewed with physician Active Inactive Abuse / Safety / Falls / Self Care Management Nursing Diagnoses: History of Falls Impaired physical mobility Goals: Patient/caregiver will verbalize/demonstrate measures taken to prevent injury and/or falls Date Initiated: 04/03/2022 Target Resolution Date: 05/30/2022 Goal Status: Active Interventions: Assess fall risk on admission and as needed Assess: immobility, friction, shearing, incontinence upon admission and as needed Assess impairment of mobility on admission and as needed per policy Notes: Nutrition Nursing Diagnoses: Impaired  glucose control: actual or potential Potential for alteratiion  in Nutrition/Potential for imbalanced nutrition Goals: Patient/caregiver will maintain therapeutic glucose control Date Initiated: 04/03/2022 Target Resolution Date: 05/30/2022 Goal Status: Active Interventions: Assess HgA1c results as ordered upon admission and as needed Provide education on elevated blood sugars and impact on wound healing Treatment Activities: Patient referred to Primary Care Physician for further nutritional evaluation : 04/03/2022 Notes: Venous Leg Ulcer Nursing Diagnoses: Knowledge deficit related to disease process and management Potential for venous Insuffiency (use before diagnosis confirmed) Goals: Patient will maintain optimal edema control Date Initiated: 04/03/2022 Target Resolution Date: 05/30/2022 Goal Status: Active Interventions: Assess peripheral edema status every visit. Compression as ordered Provide education on venous insufficiency Treatment Activities: Therapeutic compression applied : 04/03/2022 Notes: Wound/Skin Impairment Nursing Diagnoses: Impaired tissue integrity Goals: Patient/caregiver will verbalize understanding of skin care regimen Date Initiated: 04/03/2022 Target Resolution Date: 05/30/2022 Goal Status: Active Ulcer/skin breakdown will have a volume reduction of 30% by week 4 Date Initiated: 04/03/2022 Target Resolution Date: 05/30/2022 Goal Status: Active Interventions: Assess ulceration(s) every visit Provide education on ulcer and skin care Treatment Activities: Skin care regimen initiated : 04/03/2022 Topical wound management initiated : 04/03/2022 Notes: Electronic Signature(s) Signed: 05/15/2022 5:28:22 PM By: Adline Peals Entered By: Adline Peals on 05/15/2022 16:12:34 -------------------------------------------------------------------------------- Pain Assessment Details Patient Name: Date of Service: Greta Doom M. 05/15/2022 3:45 PM Medical Record Number: [id number] Patient Account Number:  [id number] Date of Birth/Sex: Treating RN: March 17, 1951 (71 y.o. [name] Primary Care Merik Mignano: Stark Klein Other Clinician: Referring Aman Bonet: Treating Ferol Laiche/Extender: Delorise Jackson in Treatment: 6 Active Problems Location of Pain Severity and Description of Pain Patient Has Paino No Site Locations Rate the pain. Current Pain Level: 0 Pain Management and Medication Current Pain Management: Electronic Signature(s) Signed: 05/15/2022 5:28:22 PM By: Adline Peals Entered By: Adline Peals on 05/15/2022 16:05:08 -------------------------------------------------------------------------------- Patient/Caregiver Education Details Patient Name: Date of Service: Jacqulyn Ducking 5/22/2023andnbsp3:45 PM Medical Record Number: [id number] Patient Account Number: [id number] Date of Birth/Gender: Treating RN: 1951/03/14 (71 y.o. [name] Primary Care Physician: Stark Klein Other Clinician: Referring Physician: Treating Physician/Extender: Delorise Jackson in Treatment: 6 Education Assessment Education Provided To: Patient Education Topics Provided Wound/Skin Impairment: Methods: Explain/Verbal Responses: Reinforcements needed, State content correctly Electronic Signature(s) Signed: 05/15/2022 5:28:22 PM By: Adline Peals Entered By: Adline Peals on 05/15/2022 16:12:47 -------------------------------------------------------------------------------- Wound Assessment Details Patient Name: Date of Service: [name], [name] 05/15/2022 3:45 PM Medical Record Number: [id number] Patient Account Number: [id number] Date of Birth/Sex: Treating RN: Apr 16, 1951 (71 y.o. [name] Primary Care Chava Dulac: Stark Klein Other Clinician: Referring Robby Pirani: Treating Landers Prajapati/Extender: [name] Weeks in Treatment: 6 Wound Status Wound  Number: 2 Primary Diabetic Wound/Ulcer of the Lower Extremity Etiology: Wound Location: Right, Posterior Calcaneus Wound Open Wounding Event: Gradually Appeared Status: Date Acquired: 01/25/2022 Comorbid Cataracts, Anemia, Sleep Apnea, Congestive Heart Failure, Weeks Of Treatment: 6 History: Hypertension, Type I Diabetes, Received Radiation Clustered Wound: No Photos Wound Measurements Length: (cm) 1.1 Width: (cm) 0.9 Depth: (cm) 0.1 Area: (cm) 0.778 Volume: (cm) 0.078 % Reduction in Area: 45% % Reduction in Volume: 72.4% Epithelialization: Medium (34-66%) Tunneling: No Undermining: No Wound Description Classification: Grade 2 Wound Margin: Distinct, outline attached Exudate Amount: Medium Exudate Type: Serosanguineous Exudate Color: red, brown Foul Odor After Cleansing: No Slough/Fibrino Yes Wound Bed Granulation Amount: Large (67-100%) Exposed Structure Granulation Quality: Red, Pink Fascia Exposed: No Necrotic Amount: Small (1-33%) Fat Layer (  Subcutaneous Tissue) Exposed: Yes Necrotic Quality: Adherent Slough Tendon Exposed: No Muscle Exposed: No Joint Exposed: No Bone Exposed: No Treatment Notes Wound #2 (Calcaneus) Wound Laterality: Right, Posterior Cleanser Soap and Water Discharge Instruction: May shower and wash wound with dial antibacterial soap and water prior to dressing change. Peri-Wound Care Sween Lotion (Moisturizing lotion) Discharge Instruction: Apply moisturizing lotion as directed Topical Primary Dressing Hydrofera Blue Classic Foam, 4x4 in Discharge Instruction: Moisten with saline prior to applying to wound bed Secondary Dressing ALLEVYN Heel 4 1/2in x 5 1/2in / 10.5cm x 13.5cm Discharge Instruction: Apply over primary dressing as directed. Woven Gauze Sponge, Non-Sterile 4x4 in Discharge Instruction: Apply over primary dressing as directed. Secured With [employer] Surgical T 2x10 (in/yd) ape Discharge Instruction: Secure  with tape as directed. Compression Wrap Compression Stockings Add-Ons Electronic Signature(s) Signed: 05/15/2022 5:28:22 PM By: Adline Peals Signed: 05/16/2022 7:59:49 AM By: Sandre Kitty Entered By: Sandre Kitty on 05/15/2022 16:13:09 -------------------------------------------------------------------------------- Wound Assessment Details Patient Name: Date of Service: [name], [name] 05/15/2022 3:45 PM Medical Record Number: [id number] Patient Account Number: [id number] Date of Birth/Sex: Treating RN: 1951/04/02 (71 y.o. [name] Primary Care Mikeria Valin: Stark Klein Other Clinician: Referring Tiasia Weberg: Treating Jaelie Aguilera/Extender: [name] Weeks in Treatment: 6 Wound Status Wound Number: 8 Primary Lymphedema Etiology: Wound Location: Left, Medial Lower Leg Wound Open Wounding Event: Blister Status: Date Acquired: 05/15/2022 Comorbid Cataracts, Anemia, Sleep Apnea, Congestive Heart Failure, Weeks Of Treatment: 0 History: Hypertension, Type I Diabetes, Received Radiation Clustered Wound: No Wound Measurements Length: (cm) 1.5 Width: (cm) 1.5 Depth: (cm) 0.1 Area: (cm) 1.767 Volume: (cm) 0.177 % Reduction in Area: -125.1% % Reduction in Volume: -124.1% Epithelialization: Small (1-33%) Tunneling: No Undermining: No Wound Description Classification: Full Thickness Without Exposed Support Structures Wound Margin: Distinct, outline attached Exudate Amount: Medium Exudate Type: Serosanguineous Exudate Color: red, brown Foul Odor After Cleansing: No Slough/Fibrino Yes Wound Bed Granulation Amount: Large (67-100%) Exposed Structure Granulation Quality: Red, Pink Fascia Exposed: No Necrotic Amount: Small (1-33%) Fat Layer (Subcutaneous Tissue) Exposed: Yes Tendon Exposed: No Muscle Exposed: No Joint Exposed: No Bone Exposed: No Treatment Notes Wound #8 (Lower Leg) Wound Laterality: Left,  Medial Cleanser Soap and Water Discharge Instruction: May shower and wash wound with dial antibacterial soap and water prior to dressing change. Wound Cleanser Discharge Instruction: Cleanse the wound with wound cleanser prior to applying a clean dressing using gauze sponges, not tissue or cotton balls. Peri-Wound Care Topical Primary Dressing Hydrofera Blue Classic Foam, 2x2 in Discharge Instruction: Moisten with saline prior to applying to wound bed Secondary Dressing Bordered Gauze, 4x4 in Discharge Instruction: Apply over primary dressing as directed. Secured With Compression Wrap Compression Stockings Environmental education officer) Signed: 05/15/2022 5:28:22 PM By: Adline Peals Entered By: Adline Peals on 05/15/2022 16:27:12 -------------------------------------------------------------------------------- Vitals Details Patient Name: Date of Service: [name], [name] 05/15/2022 3:45 PM Medical Record Number: [id number] Patient Account Number: [id number] Date of Birth/Sex: Treating RN: 04/11/1951 (71 y.o. [name] Primary Care Torell Minder: Stark Klein Other Clinician: Referring Elsie Baynes: Treating Mahayla Haddaway/Extender: [name] Weeks in Treatment: 6 Vital Signs Time Taken: 16:09 Temperature (F): 98.3 Height (in): 67 Pulse (bpm): 55 Weight (lbs): 243 Respiratory Rate (breaths/min): 18 Body Mass Index (BMI): 38.1 Blood Pressure (mmHg): 150/53 Reference Range: 80 - 120 mg / dl Electronic Signature(s) Signed: 05/15/2022 5:28:22 PM By: Adline Peals Entered By: Adline Peals on 05/15/2022 16:10:13

## 2022-05-17 ENCOUNTER — Telehealth: Payer: Self-pay

## 2022-05-17 ENCOUNTER — Other Ambulatory Visit: Payer: Self-pay | Admitting: Family Medicine

## 2022-05-17 DIAGNOSIS — G4734 Idiopathic sleep related nonobstructive alveolar hypoventilation: Secondary | ICD-10-CM

## 2022-05-17 NOTE — Telephone Encounter (Signed)
Confirmation received from [employer].

## 2022-05-17 NOTE — Telephone Encounter (Signed)
[place] RN calls nurse line reporting abnormal O2 sats for patient during home visit today.   Tillie Rung reports at rest O2 was 91% on 1L of O2. Tillie Rung reports "usually" her sats are 95%-96%.   Tillie Rung denies any other worrisome symptoms. Tillie Rung reports normal lung sounds, however did notice a "rub."   I called patient to discuss and schedule. No answer or option for VM on both numbers listed.

## 2022-05-17 NOTE — Telephone Encounter (Signed)
Community message sent to [employer] for processing.

## 2022-05-17 NOTE — Telephone Encounter (Signed)
Patient calls nurse line requesting a DME order for portable O2.   Patient reports she is going to a funeral this weekend, however will need portable O2 to attend.   Patient reports she is on 1L of home oxygen currently.   Will forward to PCP.

## 2022-05-18 DIAGNOSIS — E1065 Type 1 diabetes mellitus with hyperglycemia: Secondary | ICD-10-CM | POA: Diagnosis not present

## 2022-05-23 ENCOUNTER — Encounter (HOSPITAL_BASED_OUTPATIENT_CLINIC_OR_DEPARTMENT_OTHER): Payer: Medicare PPO | Admitting: General Surgery

## 2022-05-23 DIAGNOSIS — I890 Lymphedema, not elsewhere classified: Secondary | ICD-10-CM | POA: Diagnosis not present

## 2022-05-23 DIAGNOSIS — L97811 Non-pressure chronic ulcer of other part of right lower leg limited to breakdown of skin: Secondary | ICD-10-CM | POA: Diagnosis not present

## 2022-05-23 DIAGNOSIS — Z9981 Dependence on supplemental oxygen: Secondary | ICD-10-CM | POA: Diagnosis not present

## 2022-05-23 DIAGNOSIS — E1065 Type 1 diabetes mellitus with hyperglycemia: Secondary | ICD-10-CM | POA: Diagnosis not present

## 2022-05-23 DIAGNOSIS — E10622 Type 1 diabetes mellitus with other skin ulcer: Secondary | ICD-10-CM | POA: Diagnosis not present

## 2022-05-23 DIAGNOSIS — I872 Venous insufficiency (chronic) (peripheral): Secondary | ICD-10-CM | POA: Diagnosis not present

## 2022-05-23 DIAGNOSIS — L97812 Non-pressure chronic ulcer of other part of right lower leg with fat layer exposed: Secondary | ICD-10-CM | POA: Diagnosis not present

## 2022-05-23 DIAGNOSIS — I110 Hypertensive heart disease with heart failure: Secondary | ICD-10-CM | POA: Diagnosis not present

## 2022-05-23 DIAGNOSIS — I5032 Chronic diastolic (congestive) heart failure: Secondary | ICD-10-CM | POA: Diagnosis not present

## 2022-05-23 DIAGNOSIS — Z8673 Personal history of transient ischemic attack (TIA), and cerebral infarction without residual deficits: Secondary | ICD-10-CM | POA: Diagnosis not present

## 2022-05-23 DIAGNOSIS — L97821 Non-pressure chronic ulcer of other part of left lower leg limited to breakdown of skin: Secondary | ICD-10-CM | POA: Diagnosis not present

## 2022-05-23 NOTE — Progress Notes (Signed)
[name] [name] ([id number]) Visit Report for 05/23/2022 Chief Complaint Document Details Patient Name: Date of Service: [name] [name], [name] [name]. 05/23/2022 1:15 PM Medical Record Number: [id number] Patient Account Number: [id number] Date of Birth/Sex: Treating RN: 15-Mar-1951 (71 y.o. [name] [name] Primary Care Provider: Stark [name] Other Clinician: Referring Provider: Treating Provider/Extender: [name] [name] in Treatment: 7 Information Obtained from: Patient Chief Complaint Patient presents for treatment of open ulcers due to venous insufficiency in the setting of poorly controlled DM1 and CHF Electronic Signature(s) Signed: 05/23/2022 2:35:00 PM By: Fredirick Maudlin MD FACS Entered By: Fredirick Maudlin on 05/23/2022 14:35:00 -------------------------------------------------------------------------------- Debridement Details Patient Name: Date of Service: [name] [name], [name] [name] [name] 05/23/2022 1:15 PM Medical Record Number: [id number] Patient Account Number: [id number] Date of Birth/Sex: Treating RN: April 18, 1951 (71 y.o. [name] [name] Primary Care Provider: Stark [name] Other Clinician: Referring Provider: Treating Provider/Extender: [name] [name] in Treatment: 7 Debridement Performed for Assessment: Wound #9 Right,Anterior Lower Leg Performed By: Physician Fredirick Maudlin, MD Debridement Type: Debridement Severity of Tissue Pre Debridement: Fat layer exposed Level of Consciousness (Pre-procedure): Awake and Alert Pre-procedure Verification/Time Out Yes - 14:26 Taken: Start Time: 14:26 Pain Control: Other : Benzocaine 20% Spray T Area Debrided (L x W): otal 0.5 (cm) x 0.5 (cm) = 0.25 (cm) Tissue and other material debrided: Non-Viable, Eschar, Slough, Slough Level: Non-Viable Tissue Debridement Description: Selective/Open Wound Instrument: Curette Bleeding: Minimum Hemostasis Achieved: Pressure Procedural  Pain: 0 Post Procedural Pain: 0 Response to Treatment: Procedure was tolerated well Level of Consciousness (Post- Awake and Alert procedure): Post Debridement Measurements of Total Wound Length: (cm) 0.5 Width: (cm) 0.5 Depth: (cm) 0.1 Volume: (cm) 0.02 Character of Wound/Ulcer Post Debridement: Improved Severity of Tissue Post Debridement: Fat layer exposed Post Procedure Diagnosis Same as Pre-procedure Electronic Signature(s) Signed: 05/23/2022 3:01:24 PM By: Fredirick Maudlin MD FACS Signed: 05/23/2022 4:30:31 PM By: Sharyn Creamer RN, BSN Entered By: Sharyn Creamer on 05/23/2022 14:28:20 -------------------------------------------------------------------------------- Debridement Details Patient Name: Date of Service: [name] [name], [name] [name] [name] 05/23/2022 1:15 PM Medical Record Number: [id number] Patient Account Number: [id number] Date of Birth/Sex: Treating RN: 07-20-1951 (71 y.o. [name] [name] Primary Care Provider: Stark [name] Other Clinician: Referring Provider: Treating Provider/Extender: [name] [name] in Treatment: 7 Debridement Performed for Assessment: Wound #10 Right,Medial Lower Leg Performed By: Physician Fredirick Maudlin, MD Debridement Type: Debridement Severity of Tissue Pre Debridement: Fat layer exposed Level of Consciousness (Pre-procedure): Awake and Alert Pre-procedure Verification/Time Out Yes - 14:26 Taken: Start Time: 14:26 Pain Control: Other : Benzocaine 20% Spray T Area Debrided (L x W): otal 2 (cm) x 2 (cm) = 4 (cm) Tissue and other material debrided: Non-Viable, Slough, Slough Level: Non-Viable Tissue Debridement Description: Selective/Open Wound Instrument: Forceps, Scissors Bleeding: Minimum Hemostasis Achieved: Pressure Procedural Pain: 0 Post Procedural Pain: 0 Response to Treatment: Procedure was tolerated well Level of Consciousness (Post- Awake and Alert procedure): Post Debridement Measurements of  Total Wound Length: (cm) 4 Width: (cm) 1.8 Depth: (cm) 0.1 Volume: (cm) 0.565 Character of Wound/Ulcer Post Debridement: Improved Severity of Tissue Post Debridement: Fat layer exposed Post Procedure Diagnosis Same as Pre-procedure Electronic Signature(s) Signed: 05/23/2022 3:01:24 PM By: Fredirick Maudlin MD FACS Signed: 05/23/2022 4:30:31 PM By: Sharyn Creamer RN, BSN Entered By: Sharyn Creamer on 05/23/2022 14:30:48 -------------------------------------------------------------------------------- HPI Details Patient Name: Date of Service: [name] [name], [name] [name] [name] 05/23/2022 1:15 PM Medical Record Number: [id number] Patient Account Number: [id number] Date of Birth/Sex: Treating RN: Sep 25, 1951 (71 y.o.  [name] [name] Primary Care Provider: Stark [name] Other Clinician: Referring Provider: Treating Provider/Extender: [name] [name] in Treatment: 7 History of Present Illness HPI Description: ADMISSION 04/03/2022 This is a 71 year old woman with a past medical history notable for type 1 diabetes mellitus, poorly controlled with her most recent A1c being 8.3, congestive heart failure with recent admission for exacerbation secondary to noncompliance with diuretics, as well as history of pontine stroke and oxygen dependence. She has wounds on her bilateral lower extremities. She says they have been present for about 2 months and initially started as blisters. She says that she has compression stockings, but does not wear them because they are too difficult to get on. ABIs done in clinic were 1.4 and 1.5, suggesting some degree of noncompressibility. She also has a wound on her right heel that she says is secondary to it getting bumped frequently as she transfers. She came to clinic with just dry dressings on her legs. She does have home health care, as well as her sister that have been assisting her with her dressings. She has multiple wounds. On the left  medial calf, just above her ankle, there is a somewhat geographic wound with slough and some epithelialization. On the anterior tibial surface and lateral calf, the wounds are quite desiccated with yellow eschar present. On the right anterior tibial surface, the wounds are gritty with thin slough and eschar. The posterior right ankle wound is just above the calcaneus overlying the Achilles and has thick yellow-gray slough and a bit of odor. [name] [name] purulent drainage appreciated. 04/10/2022: The wounds on her left leg are closed. On the right, the anterior tibial surface wounds are epithelializing and much cleaner. There is just a layer of biofilm on the open areas. On the posterior right ankle wound, there is a decreased volume of slough and [name] [name] odor. We have been using Hydrofera Blue to all sites under compression. 04/17/2022: The wounds on her left leg remained closed and she did bring her juxta lite stockings with her today. On the right, the more distal wound is closed and the proximal wound has epithelialized significantly since her last visit. At the posterior right ankle, she still has accumulated some slough and some periwound callus but there is [name] [name] odor or significant drainage. 04/24/2022: The right anterior leg wounds are closed. She still has a wound on her posterior right ankle that looks about the same as last week. She did not bring her right sided juxta light with her today but is wearing the left sided stocking. 05/01/2022: She has a tiny abrasion on her left anterior tibia which we did not add to her wound count today. The wound on her posterior right ankle is a little bit smaller with a little accumulated slough. [name] [name] drainage or concern for infection. 05/08/2022: She continues to forget to bring her juxta lite stocking with her so she remains in a compression wrap despite healing of the right anterior leg wounds. The wound on her posterior right ankle/calcaneus is a bit smaller today with just a  little bit of accumulated slough. She is wearing a juxta lite stocking on the left leg. 05/15/2022: She says that she has been wearing her juxta lite stockings at home, but today she is wearing regular compression stockings that she purchased from [employer]; the juxta lite stockings are in the laundry. The wound on her posterior right ankle/calcaneus continues to contract and has just a little bit of accumulated slough. Unfortunately, her edema control  is suboptimal on the left and she has developed several small blisters with a larger 1 at the medial calf. 05/23/2022: The patient has been wearing her juxta lite stockings. Unfortunately, she has developed a large blister on her right lower leg just above the ankle. The skin overlying it is still intact. The blister that had opened on her left medial calf is smaller; and the other small blistered sites are closed. The posterior right ankle/calcaneus is about the same today. Electronic Signature(s) Signed: 05/23/2022 2:38:43 PM By: Fredirick Maudlin MD FACS Entered By: Fredirick Maudlin on 05/23/2022 14:38:43 -------------------------------------------------------------------------------- Physical Exam Details Patient Name: Date of Service: [name] [name], [name] [name] [name] 05/23/2022 1:15 PM Medical Record Number: [id number] Patient Account Number: [id number] Date of Birth/Sex: Treating RN: 11-15-1951 (71 y.o. [name] [name] Primary Care Provider: Stark [name] Other Clinician: Referring Provider: Treating Provider/Extender: [name] Weeks in Treatment: 7 Constitutional . Bradycardic, asymptomatic.. . . [name] [name] acute distress. Respiratory Normal work of breathing on supplemental oxygen. Notes 05/23/2022: Unfortunately, she has developed a large blister on her right lower leg just above the ankle. The skin overlying it is still intact. The blister that had opened on her left medial calf is smaller; and the other small blistered sites  are closed. The posterior right ankle/calcaneus is about the same today. Electronic Signature(s) Signed: 05/23/2022 2:40:12 PM By: Fredirick Maudlin MD FACS Entered By: Fredirick Maudlin on 05/23/2022 14:40:12 -------------------------------------------------------------------------------- Physician Orders Details Patient Name: Date of Service: [name] [name], [name] [name] [name] 05/23/2022 1:15 PM Medical Record Number: [id number] Patient Account Number: [id number] Date of Birth/Sex: Treating RN: 11/22/1951 (71 y.o. [name] [name] Primary Care Provider: Stark [name] Other Clinician: Referring Provider: Treating Provider/Extender: [name] [name] in Treatment: 7 Verbal / Phone Orders: [name] [name] Diagnosis Coding ICD-10 Coding Code Description [phone] Non-pressure chronic ulcer of right ankle with fat layer exposed I89.0 Lymphedema, not elsewhere classified I69.62 Chronic diastolic (congestive) heart failure E10.59 Type 1 diabetes mellitus with other circulatory complications X52.84 Type 1 diabetes mellitus with diabetic polyneuropathy Z68.41 Body mass index [BMI]40.0-44.9, adult E10.622 Type 1 diabetes mellitus with other skin ulcer L97.821 Non-pressure chronic ulcer of other part of left lower leg limited to breakdown of skin L97.811 Non-pressure chronic ulcer of other part of right lower leg limited to breakdown of skin Follow-up Appointments ppointment in 1 week. - Dr. Celine Ahr Room 1 - Monday 6/6 at 2:45 Return A Bathing/ Shower/ Hygiene May shower and wash wound with soap and water. - with dressing changes Edema Control - Lymphedema / SCD / Other Bilateral Lower Extremities Elevate legs to the level of the heart or above for 30 minutes daily and/or when sitting, a frequency of: - throughout the day Patient to wear own compression stockings every day. - juxtalites to both legs Exercise regularly [place] wound care orders this week; continue Home Health for  wound care. May utilize formulary equivalent dressing for wound treatment orders unless otherwise specified. Other Home Health Orders/Instructions: - [facility] Wound Treatment Wound #10 - Lower Leg Wound Laterality: Right, Medial Cleanser: Soap and Water Every Other Day/30 Days Discharge Instructions: May shower and wash wound with dial antibacterial soap and water prior to dressing change. Peri-Wound Care: Sween Lotion (Moisturizing lotion) Every Other Day/30 Days Discharge Instructions: Apply moisturizing lotion as directed Prim Dressing: KerraCel Ag Gelling Fiber Dressing, 4x5 in (silver alginate) Every Other Day/30 Days ary Discharge Instructions: Apply silver alginate to wound bed as instructed Secondary Dressing: ALLEVYN Heel 4 1/2in x  5 1/2in / 10.5cm x 13.5cm (Generic) Every Other Day/30 Days Discharge Instructions: Apply over primary dressing as directed. Secondary Dressing: Woven Gauze Sponge, Non-Sterile 4x4 in (Generic) Every Other Day/30 Days Discharge Instructions: Apply over primary dressing as directed. Secured With: [employer], 4.5x3.1 (in/yd) (Generic) Every Other Day/30 Days Discharge Instructions: Secure with Kerlix as directed. Secured With: 28M [employer] Public affairs consultant Surgical T 2x10 (in/yd) (Generic) Every Other Day/30 Days ape Discharge Instructions: Secure with tape as directed. Wound #2 - Calcaneus Wound Laterality: Right, Posterior Cleanser: Soap and Water Every Other Day/30 Days Discharge Instructions: May shower and wash wound with dial antibacterial soap and water prior to dressing change. Peri-Wound Care: Sween Lotion (Moisturizing lotion) Every Other Day/30 Days Discharge Instructions: Apply moisturizing lotion as directed Prim Dressing: KerraCel Ag Gelling Fiber Dressing, 4x5 in (silver alginate) Every Other Day/30 Days ary Discharge Instructions: Apply silver alginate to wound bed as instructed Secondary Dressing: ALLEVYN Heel 4 1/2in x 5 1/2in /  10.5cm x 13.5cm (Generic) Every Other Day/30 Days Discharge Instructions: Apply over primary dressing as directed. Secondary Dressing: Woven Gauze Sponge, Non-Sterile 4x4 in (Generic) Every Other Day/30 Days Discharge Instructions: Apply over primary dressing as directed. Secured With: [employer], 4.5x3.1 (in/yd) (Generic) Every Other Day/30 Days Discharge Instructions: Secure with Kerlix as directed. Secured With: 28M [employer] Public affairs consultant Surgical T 2x10 (in/yd) (Generic) Every Other Day/30 Days ape Discharge Instructions: Secure with tape as directed. Wound #9 - Lower Leg Wound Laterality: Right, Anterior Cleanser: Soap and Water Every Other Day/30 Days Discharge Instructions: May shower and wash wound with dial antibacterial soap and water prior to dressing change. Peri-Wound Care: Sween Lotion (Moisturizing lotion) Every Other Day/30 Days Discharge Instructions: Apply moisturizing lotion as directed Prim Dressing: KerraCel Ag Gelling Fiber Dressing, 4x5 in (silver alginate) Every Other Day/30 Days ary Discharge Instructions: Apply silver alginate to wound bed as instructed Secondary Dressing: ALLEVYN Heel 4 1/2in x 5 1/2in / 10.5cm x 13.5cm (Generic) Every Other Day/30 Days Discharge Instructions: Apply over primary dressing as directed. Secondary Dressing: Woven Gauze Sponge, Non-Sterile 4x4 in (Generic) Every Other Day/30 Days Discharge Instructions: Apply over primary dressing as directed. Secured With: [employer], 4.5x3.1 (in/yd) (Generic) Every Other Day/30 Days Discharge Instructions: Secure with Kerlix as directed. Secured With: 28M [employer] Public affairs consultant Surgical T 2x10 (in/yd) (Generic) Every Other Day/30 Days ape Discharge Instructions: Secure with tape as directed. Electronic Signature(s) Signed: 05/23/2022 3:18:00 PM By: Fredirick Maudlin MD FACS Signed: 05/23/2022 4:30:31 PM By: Sharyn Creamer RN, BSN Previous Signature: 05/23/2022 3:01:24 PM Version By: Fredirick Maudlin MD FACS Entered By: Sharyn Creamer on 05/23/2022 15:02:57 -------------------------------------------------------------------------------- Problem List Details Patient Name: Date of Service: [name] [name], [name] [name] [name] 05/23/2022 1:15 PM Medical Record Number: [id number] Patient Account Number: [id number] Date of Birth/Sex: Treating RN: 05/15/1951 (71 y.o. [name] [name] Primary Care Provider: Stark [name] Other Clinician: Referring Provider: Treating Provider/Extender: [name] Weeks in Treatment: 7 Active Problems ICD-10 Encounter Encounter Code Description Active Date MDM Diagnosis L97.312 Non-pressure chronic ulcer of right ankle with fat layer exposed 04/03/2022 [name] [name] Yes I89.0 Lymphedema, not elsewhere classified 04/03/2022 [name] [name] Yes R48.54 Chronic diastolic (congestive) heart failure 04/03/2022 [name] [name] Yes E10.59 Type 1 diabetes mellitus with other circulatory complications 06/20/349 [name] [name] Yes E10.42 Type 1 diabetes mellitus with diabetic polyneuropathy 04/03/2022 [name] [name] Yes Z68.41 Body mass index [BMI]40.0-44.9, adult 04/03/2022 [name] [name] Yes E10.622 Type 1 diabetes mellitus with other skin ulcer 04/03/2022 [name] [name] Yes L97.821 Non-pressure chronic ulcer of other  part of left lower leg limited to breakdown 05/15/2022 [name] [name] Yes of skin L97.811 Non-pressure chronic ulcer of other part of right lower leg limited to breakdown 05/23/2022 [name] [name] Yes of skin Inactive Problems ICD-10 Code Description Active Date Inactive Date L97.212 Non-pressure chronic ulcer of right calf with fat layer exposed 04/03/2022 04/03/2022 L97.222 Non-pressure chronic ulcer of left calf with fat layer exposed 04/03/2022 04/03/2022 Resolved Problems Electronic Signature(s) Signed: 05/23/2022 2:34:37 PM By: Fredirick Maudlin MD FACS Entered By: Fredirick Maudlin on 05/23/2022 14:34:37 -------------------------------------------------------------------------------- Progress Note Details Patient Name: Date of  Service: [name] [name], [name] [name] [name] 05/23/2022 1:15 PM Medical Record Number: [id number] Patient Account Number: [id number] Date of Birth/Sex: Treating RN: Aug 19, 1951 (71 y.o. [name] [name] Primary Care Provider: Stark [name] Other Clinician: Referring Provider: Treating Provider/Extender: [name] [name] in Treatment: 7 Subjective Chief Complaint Information obtained from Patient Patient presents for treatment of open ulcers due to venous insufficiency in the setting of poorly controlled DM1 and CHF History of Present Illness (HPI) ADMISSION 04/03/2022 This is a 71 year old woman with a past medical history notable for type 1 diabetes mellitus, poorly controlled with her most recent A1c being 8.3, congestive heart failure with recent admission for exacerbation secondary to noncompliance with diuretics, as well as history of pontine stroke and oxygen dependence. She has wounds on her bilateral lower extremities. She says they have been present for about 2 months and initially started as blisters. She says that she has compression stockings, but does not wear them because they are too difficult to get on. ABIs done in clinic were 1.4 and 1.5, suggesting some degree of noncompressibility. She also has a wound on her right heel that she says is secondary to it getting bumped frequently as she transfers. She came to clinic with just dry dressings on her legs. She does have home health care, as well as her sister that have been assisting her with her dressings. She has multiple wounds. On the left medial calf, just above her ankle, there is a somewhat geographic wound with slough and some epithelialization. On the anterior tibial surface and lateral calf, the wounds are quite desiccated with yellow eschar present. On the right anterior tibial surface, the wounds are gritty with thin slough and eschar. The posterior right ankle wound is just above the calcaneus  overlying the Achilles and has thick yellow-gray slough and a bit of odor. [name] [name] purulent drainage appreciated. 04/10/2022: The wounds on her left leg are closed. On the right, the anterior tibial surface wounds are epithelializing and much cleaner. There is just a layer of biofilm on the open areas. On the posterior right ankle wound, there is a decreased volume of slough and [name] [name] odor. We have been using Hydrofera Blue to all sites under compression. 04/17/2022: The wounds on her left leg remained closed and she did bring her juxta lite stockings with her today. On the right, the more distal wound is closed and the proximal wound has epithelialized significantly since her last visit. At the posterior right ankle, she still has accumulated some slough and some periwound callus but there is [name] [name] odor or significant drainage. 04/24/2022: The right anterior leg wounds are closed. She still has a wound on her posterior right ankle that looks about the same as last week. She did not bring her right sided juxta light with her today but is wearing the left sided stocking. 05/01/2022: She has a tiny abrasion on her left anterior tibia which we did not  add to her wound count today. The wound on her posterior right ankle is a little bit smaller with a little accumulated slough. [name] [name] drainage or concern for infection. 05/08/2022: She continues to forget to bring her juxta lite stocking with her so she remains in a compression wrap despite healing of the right anterior leg wounds. The wound on her posterior right ankle/calcaneus is a bit smaller today with just a little bit of accumulated slough. She is wearing a juxta lite stocking on the left leg. 05/15/2022: She says that she has been wearing her juxta lite stockings at home, but today she is wearing regular compression stockings that she purchased from [employer]; the juxta lite stockings are in the laundry. The wound on her posterior right ankle/calcaneus continues to contract  and has just a little bit of accumulated slough. Unfortunately, her edema control is suboptimal on the left and she has developed several small blisters with a larger 1 at the medial calf. 05/23/2022: The patient has been wearing her juxta lite stockings. Unfortunately, she has developed a large blister on her right lower leg just above the ankle. The skin overlying it is still intact. The blister that had opened on her left medial calf is smaller; and the other small blistered sites are closed. The posterior right ankle/calcaneus is about the same today. Patient History Information obtained from Patient. Family History Heart Disease - Mother, Hypertension - Mother,Father, Stroke - Child. Social History Never smoker, Marital Status - Divorced, Alcohol Use - Never, Drug Use - [name] [name] History, Caffeine Use - Never. Medical History Eyes Patient has history of Cataracts Hematologic/Lymphatic Patient has history of Anemia Respiratory Patient has history of Sleep Apnea Cardiovascular Patient has history of Congestive Heart Failure, Hypertension Endocrine Patient has history of Type I Diabetes Oncologic Patient has history of Received Radiation Hospitalization/Surgery History - pacemaker implant. - loop recorder insertion and removal. - esophagogastroduodenoscopy. - TEE without cardioversion. - colonoscopy. - left and right heart catheterization. - abdominal angiogram. - thyroidectomy. - abdominal hysterectomy. - ankle fracture surgery. Medical A Surgical History Notes nd Ear/Nose/Mouth/Throat edentulous, GERD, seasonal allergies, trouble swallowing Hematologic/Lymphatic hyperlipidemia Cardiovascular complete heart block, bradycardia, paroxysmal atrial fibrillation, pacemaker Gastrointestinal hemorrhoids Endocrine hypothyroidism Genitourinary proteinuria, frequent UTIs Musculoskeletal decreased ROM L shoulder, hemiparesis affecting L side, arthritis Neurologic benign paroxysmal  positional vertigo, intracranial vascular stenosis, lacunar infarction, stroke Oncologic thyroid cancer Psychiatric depression Objective Constitutional Bradycardic, asymptomatic.Marland Kitchen [name] [name] acute distress. Vitals Time Taken: 1:58 PM, Height: 67 in, Weight: 243 lbs, BMI: 38.1, Temperature: 98.0 F, Pulse: 50 bpm, Respiratory Rate: 20 breaths/min, Blood Pressure: 114/68 mmHg, Capillary Blood Glucose: 198 mg/dl. Respiratory Normal work of breathing on supplemental oxygen. General Notes: 05/23/2022: Unfortunately, she has developed a large blister on her right lower leg just above the ankle. The skin overlying it is still intact. The blister that had opened on her left medial calf is smaller; and the other small blistered sites are closed. The posterior right ankle/calcaneus is about the same today. Integumentary (Hair, Skin) Wound #10 status is Open. Original cause of wound was Blister. The date acquired was: 05/23/2022. The wound is located on the Right,Medial Lower Leg. The wound measures 0.6cm length x 0.6cm width x 0.1cm depth; 0.283cm^2 area and 0.028cm^3 volume. There is Fat Layer (Subcutaneous Tissue) exposed. There is [name] [name] tunneling or undermining noted. There is a medium amount of serosanguineous drainage noted. The wound margin is flat and intact. There is large (67-100%) red granulation within the wound bed. There is [name] [name] necrotic tissue  within the wound bed. Wound #2 status is Open. Original cause of wound was Gradually Appeared. The date acquired was: 01/25/2022. The wound has been in treatment 7 weeks. The wound is located on the Right,Posterior Calcaneus. The wound measures 0.4cm length x 0.4cm width x 0.1cm depth; 0.126cm^2 area and 0.013cm^3 volume. There is Fat Layer (Subcutaneous Tissue) exposed. There is a medium amount of serosanguineous drainage noted. The wound margin is distinct with the outline attached to the wound base. There is large (67-100%) red, pink granulation within the wound  bed. There is a small (1-33%) amount of necrotic tissue within the wound bed including Adherent Slough. Wound #8 status is Healed - Epithelialized. Original cause of wound was Blister. The date acquired was: 05/15/2022. The wound has been in treatment 1 weeks. The wound is located on the Left,Medial Lower Leg. The wound measures 0cm length x 0cm width x 0cm depth; 0cm^2 area and 0cm^3 volume. There is [name] [name] tunneling or undermining noted. There is a none present amount of drainage noted. The wound margin is distinct with the outline attached to the wound base. There is [name] [name] granulation within the wound bed. There is [name] [name] necrotic tissue within the wound bed. Wound #9 status is Open. Original cause of wound was Blister. The date acquired was: 05/23/2022. The wound is located on the Right,Anterior Lower Leg. The wound measures 0.5cm length x 0.5cm width x 0.1cm depth; 0.196cm^2 area and 0.02cm^3 volume. There is Fat Layer (Subcutaneous Tissue) exposed. There is [name] [name] tunneling or undermining noted. There is a medium amount of serosanguineous drainage noted. The wound margin is flat and intact. There is small (1-33%) red granulation within the wound bed. There is a large (67-100%) amount of necrotic tissue within the wound bed including Adherent Slough. 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 There was a Selective/Open Wound Non-Viable Tissue Debridement with a total area of 4 sq cm performed by Fredirick Maudlin, MD. With the following instrument(s): Forceps, and Scissors to remove Non-Viable tissue/material. Material removed includes [place] after achieving pain control using Other (Benzocaine 20% Spray). [name] [name] specimens were taken. A time out was conducted at 14:26, prior to the start of the procedure. A Minimum amount of bleeding was controlled with Pressure. The procedure was tolerated well with a pain level of 0 throughout and a pain level of 0 following the procedure. Post Debridement Measurements: 4cm length x 1.8cm width x 0.1cm depth; 0.565cm^3 volume. Character of Wound/Ulcer Post Debridement is improved. Severity of Tissue Post Debridement is: Fat layer exposed. Post procedure Diagnosis Wound #10: Same as Pre-Procedure Wound #9 Pre-procedure diagnosis of Wound #9 is a Diabetic Wound/Ulcer of the Lower Extremity located on the Right,Anterior Lower Leg .Severity of Tissue Pre Debridement is: Fat layer exposed. There was a Selective/Open Wound Non-Viable Tissue Debridement with a total area of 0.25 sq cm performed by Fredirick Maudlin, MD. With the  following instrument(s): Curette to remove Non-Viable tissue/material. Material removed includes Eschar and Slough and after achieving pain control using Other (Benzocaine 20% Spray). [name] [name] specimens were taken. A time out was conducted at 14:26, prior to the start of the procedure. A Minimum amount of bleeding was controlled with Pressure. The procedure was tolerated well with a pain level of 0 throughout and a pain level of 0 following the procedure. Post Debridement Measurements: 0.5cm length x 0.5cm width x 0.1cm depth; 0.02cm^3 volume. Character of Wound/Ulcer Post Debridement is improved. Severity of Tissue Post Debridement is: Fat layer  exposed. Post procedure Diagnosis Wound #9: Same as Pre-Procedure Plan Follow-up Appointments: Return Appointment in 1 week. - Dr. Celine Ahr Room 1 - Monday 6/6 at 2:45 Bathing/ Shower/ Hygiene: May shower and wash wound with soap and water. - with dressing changes Edema Control - Lymphedema / SCD / Other: Elevate legs to the level of the heart or above for 30 minutes daily and/or when sitting, a frequency of: - throughout the day Patient to wear own compression stockings every day. - juxtalites to both legs Exercise regularly Home Health: New wound care orders this week; continue Home Health for wound care. May utilize formulary equivalent dressing for wound treatment orders unless otherwise specified. Other Home Health Orders/Instructions: [facility] [place] WOUND #10: - Lower Leg Wound Laterality: Right, Medial Cleanser: Soap and Water Every Other Day/30 Days Discharge Instructions: May shower and wash wound with dial antibacterial soap and water prior to dressing change. Peri-Wound Care: Sween Lotion (Moisturizing lotion) Every Other Day/30 Days Discharge Instructions: Apply moisturizing lotion as directed Prim Dressing: KerraCel Ag Gelling Fiber Dressing, 4x5 in (silver alginate) Every Other Day/30 Days ary Discharge Instructions: Apply silver alginate to wound bed as instructed Secondary Dressing: ALLEVYN Heel 4 1/2in x 5 1/2in / 10.5cm x 13.5cm (Generic) Every Other Day/30 Days Discharge Instructions: Apply over primary dressing as directed. Secondary Dressing: Woven Gauze Sponge, Non-Sterile 4x4 in (Generic) Every Other Day/30 Days Discharge Instructions: Apply over primary dressing as directed. Secured With: 5M Medipore Public affairs consultant Surgical T 2x10 (in/yd) (Generic) Every Other Day/30 Days ape Discharge Instructions: Secure with tape as directed. WOUND #2: - Calcaneus Wound Laterality: Right, Posterior Cleanser: Soap and Water Every Other Day/30 Days Discharge Instructions: May shower and  wash wound with dial antibacterial soap and water prior to dressing change. Peri-Wound Care: Sween Lotion (Moisturizing lotion) Every Other Day/30 Days Discharge Instructions: Apply moisturizing lotion as directed Prim Dressing: KerraCel Ag Gelling Fiber Dressing, 4x5 in (silver alginate) Every Other Day/30 Days ary Discharge Instructions: Apply silver alginate to wound bed as instructed Secondary Dressing: ALLEVYN Heel 4 1/2in x 5 1/2in / 10.5cm x 13.5cm (Generic) Every Other Day/30 Days Discharge Instructions: Apply over primary dressing as directed. Secondary Dressing: Woven Gauze Sponge, Non-Sterile 4x4 in (Generic) Every Other Day/30 Days Discharge Instructions: Apply over primary dressing as directed. Secured With: 5M Medipore Public affairs consultant Surgical T 2x10 (in/yd) (Generic) Every Other Day/30 Days ape Discharge Instructions: Secure with tape as directed. WOUND #9: - Lower Leg Wound Laterality: Right, Anterior Cleanser: Soap and Water Every Other Day/30 Days Discharge Instructions: May shower and wash wound with dial antibacterial soap and water prior to dressing change. Peri-Wound Care: Sween Lotion (Moisturizing lotion) Every Other Day/30 Days Discharge Instructions: Apply moisturizing lotion as directed Prim Dressing: KerraCel Ag Gelling Fiber Dressing, 4x5 in (silver alginate) Every Other Day/30 Days ary Discharge Instructions: Apply silver alginate to wound bed as instructed Secondary Dressing: ALLEVYN Heel 4 1/2in x  5 1/2in / 10.5cm x 13.5cm (Generic) Every Other Day/30 Days Discharge Instructions: Apply over primary dressing as directed. Secondary Dressing: Woven Gauze Sponge, Non-Sterile 4x4 in (Generic) Every Other Day/30 Days Discharge Instructions: Apply over primary dressing as directed. Secured With: 60M Medipore Public affairs consultant Surgical T 2x10 (in/yd) (Generic) Every Other Day/30 Days ape Discharge Instructions: Secure with tape as directed. 05/23/2022: Unfortunately, she has  developed a large blister on her right lower leg just above the ankle. The skin overlying it is still intact. The blister that had opened on her left medial calf is smaller; and the other small blistered sites are closed. The posterior right ankle/calcaneus is about the same today. I used a curette to debride slough and eschar from the right knee and posterior calcaneus. I used forceps and tissue scissors to remove the dead skin overlying the blister on her right lower leg. The wound is limited to skin breakdown. I am going to change her dressing to silver alginate. The patient requested that she be allowed to wear her juxta lite stockings rather than be put in compression wraps. I told her I would prefer that she be in wraps but that I would let her try 1 more week in the juxta lites but if her wounds deteriorated or she accumulated more wounds, I would insist that she be put in compression. She agreed to accept this. Follow-up in 1 week. Electronic Signature(s) Signed: 05/23/2022 2:41:48 PM By: Fredirick Maudlin MD FACS Entered By: Fredirick Maudlin on 05/23/2022 14:41:48 -------------------------------------------------------------------------------- HxROS Details Patient Name: Date of Service: [name] [name], [name] [name] [name] 05/23/2022 1:15 PM Medical Record Number: [id number] Patient Account Number: [id number] Date of Birth/Sex: Treating RN: Dec 13, 1951 (71 y.o. [name] [name] Primary Care Provider: Stark [name] Other Clinician: Referring Provider: Treating Provider/Extender: [name] [name] in Treatment: 7 Information Obtained From Patient Eyes Medical History: Positive for: Cataracts Ear/Nose/Mouth/Throat Medical History: Past Medical History Notes: edentulous, GERD, seasonal allergies, trouble swallowing Hematologic/Lymphatic Medical History: Positive for: Anemia Past Medical History Notes: hyperlipidemia Respiratory Medical History: Positive for:  Sleep Apnea Cardiovascular Medical History: Positive for: Congestive Heart Failure; Hypertension Past Medical History Notes: complete heart block, bradycardia, paroxysmal atrial fibrillation, pacemaker Gastrointestinal Medical History: Past Medical History Notes: hemorrhoids Endocrine Medical History: Positive for: Type I Diabetes Past Medical History Notes: hypothyroidism Time with diabetes: 36 yrs Treated with: Insulin Blood sugar tested every day: Yes Tested : on pump Genitourinary Medical History: Past Medical History Notes: proteinuria, frequent UTIs Musculoskeletal Medical History: Past Medical History Notes: decreased ROM L shoulder, hemiparesis affecting L side, arthritis Neurologic Medical History: Past Medical History Notes: benign paroxysmal positional vertigo, intracranial vascular stenosis, lacunar infarction, stroke Oncologic Medical History: Positive for: Received Radiation Past Medical History Notes: thyroid cancer Psychiatric Medical History: Past Medical History Notes: depression HBO Extended History Items Eyes: Cataracts Immunizations Pneumococcal Vaccine: Received Pneumococcal Vaccination: Yes Received Pneumococcal Vaccination On or After 60th Birthday: Yes Implantable Devices Yes Hospitalization / Surgery History Type of Hospitalization/Surgery pacemaker implant loop recorder insertion and removal esophagogastroduodenoscopy TEE without cardioversion colonoscopy left and right heart catheterization abdominal angiogram thyroidectomy abdominal hysterectomy ankle fracture surgery Family and Social History Heart Disease: Yes - Mother; Hypertension: Yes - Mother,Father; Stroke: Yes - Child; Never smoker; Marital Status - Divorced; Alcohol Use: Never; Drug Use: [name] [name] History; Caffeine Use: Never; Financial Concerns: [name] [name]; Food, Clothing or Shelter Needs: [name] [name]; Support System Lacking: [name] [name]; Transportation Concerns: [name] [name] Electronic  Signature(s) Signed: 05/23/2022 3:01:24 PM By: Fredirick Maudlin MD FACS Signed: 05/23/2022  4:02:28 PM By: Sabas Sous By: Fredirick Maudlin on 05/23/2022 14:39:18 -------------------------------------------------------------------------------- SuperBill Details Patient Name: Date of Service: [name] [name], [name] [name] [name] 05/23/2022 Medical Record Number: [id number] Patient Account Number: [id number] Date of Birth/Sex: Treating RN: 1951/12/24 (71 y.o. 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 Y07.37 Type 1 diabetes mellitus with diabetic polyneuropathy Z68.41 Body mass index [BMI]40.0-44.9, adult E10.622 Type 1 diabetes mellitus with other skin ulcer L97.821 Non-pressure chronic ulcer of other part of left lower leg limited to breakdown of skin L97.811 Non-pressure chronic ulcer of other part of right lower leg limited to breakdown of skin Facility Procedures CPT4 Code: [id number] Description: [phone] - DEBRIDE WOUND 1ST 20 SQ CM OR < ICD-10 Diagnosis Description L97.312 Non-pressure chronic ulcer of right ankle with fat layer exposed L97.821 Non-pressure chronic ulcer of other part of left lower leg limited to breakdown o L97.811  Non-pressure chronic ulcer of other part of right lower leg limited to breakdown Modifier: f skin of skin Quantity: 1 Physician Procedures : CPT4 Code Description Modifier [id number] - WC PHYS LEVEL 3 - EST PT 25 ICD-10 Diagnosis Description L97.312 Non-pressure chronic ulcer of right ankle with fat layer exposed L97.821 Non-pressure  chronic ulcer of other part of left lower leg limited  to breakdown of skin L97.811 Non-pressure chronic ulcer of other part of right lower leg limited to breakdown of skin I89.0 Lymphedema, not elsewhere classified Quantity: 1 : [id number] - WC PHYS DEBR WO ANESTH 20 SQ CM ICD-10 Diagnosis Description L97.312 Non-pressure chronic ulcer of right ankle with fat layer exposed L97.821 Non-pressure chronic ulcer of other part of left lower leg limited to breakdown of skin  L97.811 Non-pressure chronic ulcer of other part of right lower leg limited to breakdown of skin Quantity: 1 Electronic Signature(s) Signed: 05/23/2022 2:42:47 PM By: Fredirick Maudlin MD FACS Entered By: Fredirick Maudlin on 05/23/2022 14:42:47

## 2022-05-23 NOTE — Progress Notes (Signed)
Detert, [name] ([id number]) Visit Report for 05/23/2022 Arrival Information Details Patient Name: Date of Service: [place], [name] 05/23/2022 1:15 PM Medical Record Number: [id number] Patient Account Number: [id number] Date of Birth/Sex: Treating RN: 07/08/1951 (71 y.o. [name] Primary Care Agustus Mane: Stark Klein Other Clinician: Referring Gionni Vaca: Treating Maxximus Gotay/Extender: Delorise Jackson in Treatment: 7 Visit Information History Since Last Visit Added or deleted any medications: No Patient Arrived: Wheel Chair Any new allergies or adverse reactions: No Arrival Time: 13:51 Had a fall or experienced change in No Accompanied By: self activities of daily living that may affect Transfer Assistance: Manual risk of falls: Patient Identification Verified: Yes Signs or symptoms of abuse/neglect since last visito No Secondary Verification Process Completed: Yes Hospitalized since last visit: No Patient Requires Transmission-Based Precautions: No Implantable device outside of the clinic excluding No Patient Has Alerts: Yes cellular tissue based products placed in the center Patient Alerts: Patient on Blood Thinner since last visit: pacemaker Has Dressing in Place as Prescribed: Yes Has Compression in Place as Prescribed: Yes Pain Present Now: No Electronic Signature(s) Signed: 05/23/2022 4:30:31 PM By: Sharyn Creamer RN, BSN Entered By: Sharyn Creamer on 05/23/2022 13:56:26 -------------------------------------------------------------------------------- Encounter Discharge Information Details Patient Name: Date of Service: [name], [name] [place] [name] 05/23/2022 1:15 PM Medical Record Number: [id number] Patient Account Number: [id number] Date of Birth/Sex: Treating RN: July 18, 1951 (71 y.o. [name] Primary Care [name]: Stark Klein Other Clinician: Referring Egan Sahlin: Treating [name]/Extender: Delorise Jackson in Treatment: 7 Encounter Discharge Information Items Post Procedure Vitals Discharge Condition: Stable Temperature (F): 98.0 Ambulatory Status: Wheelchair Pulse (bpm): 50 Discharge Destination: Home Respiratory Rate (breaths/min): 20 Transportation: Private Auto Blood Pressure (mmHg): 114/68 Accompanied By: neice Schedule Follow-up Appointment: Yes Clinical Summary of Care: Patient Declined Electronic Signature(s) Signed: 05/23/2022 4:30:31 PM By: Sharyn Creamer RN, BSN Entered By: Sharyn Creamer on 05/23/2022 15:05:22 -------------------------------------------------------------------------------- Lower Extremity Assessment Details Patient Name: Date of Service: [name], [name] [place] [name] 05/23/2022 1:15 PM Medical Record Number: [id number] Patient Account Number: [id number] Date of Birth/Sex: Treating RN: Jan 06, 1951 (71 y.o. [name] Primary Care Kaydan Wilhoite: Stark Klein Other Clinician: Referring Dalya Maselli: Treating Gene Colee/Extender: [name] Weeks in Treatment: 7 Edema Assessment Assessed: [Left: No] [Right: No] E[Left: dema] [Right: :] Calf Left: Right: Point of Measurement: From Medial Instep 38.4 cm 35.2 cm Ankle Left: Right: Point of Measurement: From Medial Instep 23.7 cm 22.3 cm Vascular Assessment Pulses: Dorsalis Pedis Palpable: [Left:Yes] [Right:Yes] Electronic Signature(s) Signed: 05/23/2022 4:30:31 PM By: Sharyn Creamer RN, BSN Entered By: Sharyn Creamer on 05/23/2022 14:05:14 -------------------------------------------------------------------------------- Multi Wound Chart Details Patient Name: Date of Service: [name], [name] 05/23/2022 1:15 PM Medical Record Number: [id number] Patient Account Number: [id number] Date of Birth/Sex: Treating RN: 1951-09-02 (71 y.o. 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 Primary Etiology: Extremity Extremity Cataracts, Anemia, Sleep Apnea, Cataracts, Anemia, Sleep Apnea, Cataracts, Anemia, Sleep Apnea, Comorbid History: Congestive Heart Failure, Congestive Heart Failure, Congestive Heart Failure, Hypertension, Type I Diabetes, Hypertension, Type I Diabetes, Hypertension, Type I Diabetes, Received Radiation  Received Radiation Received Radiation 05/23/2022 01/25/2022 05/15/2022 Date Acquired: 0 7 1 Weeks of Treatment: Open Open Healed - Epithelialized Wound Status: No No No Wound Recurrence: 0.6x0.6x0.1 0.4x0.4x0.1 0x0x0 Measurements L x W x D (cm) 0.283 0.126 0 A (cm) : rea 0.028 0.013 0 Volume (cm) : 0.00% 91.10% 100.00% % Reduction in A rea: 0.00% 95.40% 100.00% % Reduction in Volume: Grade 2 Grade 2 Full Thickness Without Exposed Classification: Support Structures Medium Medium None Present Exudate A mount: Serosanguineous Serosanguineous N/A Exudate Type: red, brown red, brown N/A Exudate Color: Flat and Intact Distinct, outline attached Distinct, outline attached Wound Margin: Large (67-100%) Large (67-100%) None Present (0%) Granulation A mount: Red Red, Pink N/A Granulation Quality: None Present (0%) Small (1-33%) None Present (0%) Necrotic A mount: Fat Layer (Subcutaneous Tissue): Yes Fat Layer (Subcutaneous Tissue): Yes Fascia: No Exposed Structures: Fascia: No Fascia: No Fat Layer (Subcutaneous Tissue): No Tendon: No Tendon:  No Tendon: No Muscle: No Muscle: No Muscle: No Joint: No Joint: No Joint: No Bone: No Bone: No Bone: No None Medium (34-66%) Large (67-100%) Epithelialization: Debridement - Selective/Open Wound N/A N/A Debridement: Pre-procedure Verification/Time Out 14:26 N/A N/A Taken: Other N/A N/A Pain Control: Slough N/A N/A Tissue Debrided: Non-Viable Tissue N/A N/A Level: 4 N/A N/A Debridement A (sq cm): rea Forceps, Scissors N/A N/A Instrument: Minimum N/A N/A Bleeding: Pressure N/A N/A Hemostasis A chieved: 0 N/A N/A Procedural Pain: 0 N/A N/A Post Procedural Pain: Procedure was tolerated well N/A N/A Debridement Treatment Response: 4x1.8x0.1 N/A N/A Post Debridement Measurements L x W x D (cm) 0.565 N/A N/A Post Debridement Volume: (cm) Debridement N/A N/A Procedures Performed: Wound Number: 9 N/A N/A Photos: N/A N/A Right, Anterior Lower Leg N/A N/A Wound Location: Blister N/A N/A Wounding Event: Diabetic Wound/Ulcer of the Lower N/A N/A Primary Etiology: Extremity Cataracts, Anemia, Sleep Apnea, N/A N/A Comorbid History: Congestive Heart Failure, Hypertension, Type I Diabetes, Received Radiation 05/23/2022 N/A N/A Date Acquired: 0 N/A N/A Weeks of Treatment: Open N/A N/A Wound Status: No N/A N/A Wound Recurrence: 0.5x0.5x0.1 N/A N/A Measurements L x W x D (cm) 0.196 N/A N/A A (cm) : rea 0.02 N/A N/A Volume (cm) : 0.00% N/A N/A % Reduction in A rea: 0.00% N/A N/A % Reduction in Volume: Grade 2 N/A N/A Classification: Medium N/A N/A Exudate A mount: Serosanguineous N/A N/A Exudate Type: red, brown N/A N/A Exudate Color: Flat and Intact N/A N/A Wound Margin: Small (1-33%) N/A N/A Granulation A mount: Red N/A N/A Granulation Quality: Large (67-100%) N/A N/A Necrotic A mount: Fat Layer (Subcutaneous Tissue): Yes N/A N/A Exposed Structures: Fascia: No Tendon: No Muscle: No Joint: No Bone: No None N/A  N/A Epithelialization: Debridement - Selective/Open Wound N/A N/A Debridement: Pre-procedure Verification/Time Out 14:26 N/A N/A Taken: Other N/A N/A Pain Control: Necrotic/Eschar, Slough N/A N/A Tissue Debrided: Non-Viable Tissue N/A N/A Level: 0.25 N/A N/A Debridement A (sq cm): rea Curette N/A N/A Instrument: Minimum N/A N/A Bleeding: Pressure N/A N/A Hemostasis A chieved: 0 N/A N/A Procedural Pain: 0 N/A N/A Post Procedural Pain: Procedure was tolerated well N/A N/A Debridement Treatment Response: 0.5x0.5x0.1 N/A N/A Post Debridement Measurements L x W x D (cm) 0.02 N/A N/A Post Debridement Volume: (cm) Debridement N/A N/A Procedures Performed: Treatment Notes Electronic Signature(s) Signed: 05/23/2022 2:34:44 PM By: Fredirick Maudlin MD FACS Signed: 05/23/2022 4:02:28 PM By: Adline Peals Entered By: Fredirick Maudlin on 05/23/2022 14:34:44 -------------------------------------------------------------------------------- Multi-Disciplinary Care Plan Details Patient Name: Date of Service: [name], [name]  05/23/2022 1:15 PM Medical Record Number: [id number] Patient Account Number: [id number] Date of Birth/Sex: Treating RN: 05/26/1951 (71 y.o. [name] Primary Care Atif Chapple: Stark Klein Other Clinician: Referring Vesper Trant: Treating Charmian Forbis/Extender: Delorise Jackson in Treatment: 7 Multidisciplinary Care Plan reviewed with physician Active Inactive Abuse / Safety / Falls / Self Care Management Nursing Diagnoses: History of Falls Impaired physical mobility Goals: Patient/caregiver will verbalize/demonstrate measures taken to prevent injury and/or falls Date Initiated: 04/03/2022 Target Resolution Date: 05/30/2022 Goal Status: Active Interventions: Assess fall risk on admission and as needed Assess: immobility, friction, shearing, incontinence upon admission and as needed Assess impairment of mobility on  admission and as needed per policy Notes: Nutrition Nursing Diagnoses: Impaired glucose control: actual or potential Potential for alteratiion in Nutrition/Potential for imbalanced nutrition Goals: Patient/caregiver will maintain therapeutic glucose control Date Initiated: 04/03/2022 Target Resolution Date: 05/30/2022 Goal Status: Active Interventions: Assess HgA1c results as ordered upon admission and as needed Provide education on elevated blood sugars and impact on wound healing Treatment Activities: Patient referred to Primary Care Physician for further nutritional evaluation : 04/03/2022 Notes: Venous Leg Ulcer Nursing Diagnoses: Knowledge deficit related to disease process and management Potential for venous Insuffiency (use before diagnosis confirmed) Goals: Patient will maintain optimal edema control Date Initiated: 04/03/2022 Target Resolution Date: 05/30/2022 Goal Status: Active Interventions: Assess peripheral edema status every visit. Compression as ordered Provide education on venous insufficiency Treatment Activities: Therapeutic compression applied : 04/03/2022 Notes: Wound/Skin Impairment Nursing Diagnoses: Impaired tissue integrity Goals: Patient/caregiver will verbalize understanding of skin care regimen Date Initiated: 04/03/2022 Target Resolution Date: 05/30/2022 Goal Status: Active Ulcer/skin breakdown will have a volume reduction of 30% by week 4 Date Initiated: 04/03/2022 Target Resolution Date: 05/30/2022 Goal Status: Active Interventions: Assess ulceration(s) every visit Provide education on ulcer and skin care Treatment Activities: Skin care regimen initiated : 04/03/2022 Topical wound management initiated : 04/03/2022 Notes: Electronic Signature(s) Signed: 05/23/2022 4:30:31 PM By: Sharyn Creamer RN, BSN Entered By: Sharyn Creamer on 05/23/2022 15:03:31 -------------------------------------------------------------------------------- Pain Assessment  Details Patient Name: Date of Service: [name], [name] 05/23/2022 1:15 PM Medical Record Number: [id number] Patient Account Number: [id number] Date of Birth/Sex: Treating RN: 02/18/1951 (71 y.o. [name] Primary Care Elaijah Munoz: Stark Klein Other Clinician: Referring Keyasia Jolliff: Treating Barett Whidbee/Extender: [name] Weeks in Treatment: 7 Active Problems Location of Pain Severity and Description of Pain Patient Has Paino No Site Locations Pain Management and Medication Current Pain Management: Electronic Signature(s) Signed: 05/23/2022 4:30:31 PM By: Sharyn Creamer RN, BSN Entered By: Sharyn Creamer on 05/23/2022 14:03:05 -------------------------------------------------------------------------------- Patient/Caregiver Education Details Patient Name: Date of Service: Jacqulyn Ducking 5/30/2023andnbsp1:15 PM Medical Record Number: [id number] Patient Account Number: [id number] Date of Birth/Gender: Treating RN: [birth year] (71 y.o. [name] Primary Care Physician: Stark Klein Other Clinician: Referring Physician: Treating Physician/Extender: Delorise Jackson in Treatment: 7 Education Assessment Education Provided To: Patient Education Topics Provided Elevated Blood Sugar/ Impact on Healing: Methods: Explain/Verbal Responses: State content correctly Venous: Methods: Explain/Verbal Responses: State content correctly Wound/Skin Impairment: Methods: Explain/Verbal Responses: State content correctly Electronic Signature(s) Signed: 05/23/2022 4:30:31 PM By: Sharyn Creamer RN, BSN Entered By: Sharyn Creamer on 05/23/2022 14:19:14 -------------------------------------------------------------------------------- Wound Assessment Details Patient Name: Date of Service: [place], [name] 05/23/2022 1:15 PM Medical Record Number: [id number] Patient Account Number: [id number] Date of  Birth/Sex: Treating RN: 11-29-1951 (71 y.o. [name] Primary Care Mackynzie Woolford: Stark Klein Other Clinician: Referring Roxanne Orner: Treating Miah Boye/Extender: Oletha Blend,  Makiera Weeks in Treatment: 7 Wound Status Wound Number: 10 Primary Diabetic Wound/Ulcer of the Lower Extremity Etiology: Wound Location: Right, Medial Lower Leg Wound Open Wounding Event: Blister Status: Date Acquired: 05/23/2022 Comorbid Cataracts, Anemia, Sleep Apnea, Congestive Heart Failure, Weeks Of Treatment: 0 History: Hypertension, Type I Diabetes, Received Radiation Clustered Wound: No Photos Wound Measurements Length: (cm) 0.6 Width: (cm) 0.6 Depth: (cm) 0.1 Area: (cm) 0.283 Volume: (cm) 0.028 % Reduction in Area: 0% % Reduction in Volume: 0% Epithelialization: None Tunneling: No Undermining: No Wound Description Classification: Grade 2 Wound Margin: Flat and Intact Exudate Amount: Medium Exudate Type: Serosanguineous Exudate Color: red, brown Foul Odor After Cleansing: No Slough/Fibrino No Wound Bed Granulation Amount: Large (67-100%) Exposed Structure Granulation Quality: Red Fascia Exposed: No Necrotic Amount: None Present (0%) Fat Layer (Subcutaneous Tissue) Exposed: Yes Tendon Exposed: No Muscle Exposed: No Joint Exposed: No Bone Exposed: No Treatment Notes Wound #10 (Lower Leg) Wound Laterality: Right, Medial Cleanser Soap and Water Discharge Instruction: May shower and wash wound with dial antibacterial soap and water prior to dressing change. Peri-Wound Care Sween Lotion (Moisturizing lotion) Discharge Instruction: Apply moisturizing lotion as directed Topical Primary Dressing KerraCel Ag Gelling Fiber Dressing, 4x5 in (silver alginate) Discharge Instruction: Apply silver alginate to wound bed as instructed Secondary Dressing ALLEVYN Heel 4 1/2in x 5 1/2in / 10.5cm x 13.5cm Discharge Instruction: Apply over primary dressing as directed. Woven  Gauze Sponge, Non-Sterile 4x4 in Discharge Instruction: Apply over primary dressing as directed. Secured With [employer], 4.5x3.1 (in/yd) Discharge Instruction: Secure with Kerlix as directed. 17M Medipore Soft Cloth Surgical T 2x10 (in/yd) ape Discharge Instruction: Secure with tape as directed. Compression Wrap Compression Stockings Add-Ons Electronic Signature(s) Signed: 05/23/2022 4:30:31 PM By: Sharyn Creamer RN, BSN Entered By: Sharyn Creamer on 05/23/2022 14:15:46 -------------------------------------------------------------------------------- Wound Assessment Details Patient Name: Date of Service: [name], [name] 05/23/2022 1:15 PM Medical Record Number: [id number] Patient Account Number: [id number] Date of Birth/Sex: Treating RN: [birth year] (71 y.o. [name] Primary Care Maryssa Giampietro: Stark Klein Other Clinician: Referring Ofilia Rayon: Treating Konner Warrior/Extender: [name] Weeks in Treatment: 7 Wound Status Wound Number: 2 Primary Diabetic Wound/Ulcer of the Lower Extremity Etiology: Wound Location: Right, Posterior Calcaneus Wound Open Wounding Event: Gradually Appeared Status: Date Acquired: 01/25/2022 Comorbid Cataracts, Anemia, Sleep Apnea, Congestive Heart Failure, Weeks Of Treatment: 7 History: Hypertension, Type I Diabetes, Received Radiation Clustered Wound: No Photos Wound Measurements Length: (cm) 0.4 Width: (cm) 0.4 Depth: (cm) 0.1 Area: (cm) 0.126 Volume: (cm) 0.013 Wound Description Classification: Grade 2 Wound Margin: Distinct, outline attached Exudate Amount: Medium Exudate Type: Serosanguineous Exudate Color: red, brown Foul Odor After Cleansing: No Slough/Fibrino Yes % Reduction in Area: 91.1% % Reduction in Volume: 95.4% Epithelialization: Medium (34-66%) Wound Bed Granulation Amount: Large (67-100%) Exposed Structure Granulation Quality: Red, Pink Fascia Exposed: No Necrotic Amount:  Small (1-33%) Fat Layer (Subcutaneous Tissue) Exposed: Yes Necrotic Quality: Adherent Slough Tendon Exposed: No Muscle Exposed: No Joint Exposed: No Bone Exposed: No Treatment Notes Wound #2 (Calcaneus) Wound Laterality: Right, Posterior Cleanser Soap and Water Discharge Instruction: May shower and wash wound with dial antibacterial soap and water prior to dressing change. Peri-Wound Care Sween Lotion (Moisturizing lotion) Discharge Instruction: Apply moisturizing lotion as directed Topical Primary Dressing KerraCel Ag Gelling Fiber Dressing, 4x5 in (silver alginate) Discharge Instruction: Apply silver alginate to wound bed as instructed Secondary Dressing ALLEVYN Heel 4 1/2in x 5 1/2in / 10.5cm x 13.5cm Discharge Instruction: Apply over primary dressing as directed. Woven  Gauze Sponge, Non-Sterile 4x4 in Discharge Instruction: Apply over primary dressing as directed. Secured With [employer], 4.5x3.1 (in/yd) Discharge Instruction: Secure with Kerlix as directed. 31M Medipore Soft Cloth Surgical T 2x10 (in/yd) ape Discharge Instruction: Secure with tape as directed. Compression Wrap Compression Stockings Add-Ons Electronic Signature(s) Signed: 05/23/2022 4:30:31 PM By: Sharyn Creamer RN, BSN Entered By: Sharyn Creamer on 05/23/2022 14:16:39 -------------------------------------------------------------------------------- Wound Assessment Details Patient Name: Date of Service: [name], [name] 05/23/2022 1:15 PM Medical Record Number: [id number] Patient Account Number: [id number] Date of Birth/Sex: Treating RN: [birth year] (71 y.o. [name] Primary Care Suraiya Dickerson: Stark Klein Other Clinician: Referring Havard Radigan: Treating Donice Alperin/Extender: [name] Weeks in Treatment: 7 Wound Status Wound Number: 8 Primary Lymphedema Etiology: Wound Location: Left, Medial Lower Leg Wound Healed - Epithelialized Wounding Event:  Blister Status: Date Acquired: 05/15/2022 Comorbid Cataracts, Anemia, Sleep Apnea, Congestive Heart Failure, Weeks Of Treatment: 1 History: Hypertension, Type I Diabetes, Received Radiation Clustered Wound: No Photos Wound Measurements Length: (cm) Width: (cm) Depth: (cm) Area: (cm) Volume: (cm) 0 % Reduction in Area: 100% 0 % Reduction in Volume: 100% 0 Epithelialization: Large (67-100%) 0 Tunneling: No 0 Undermining: No Wound Description Classification: Full Thickness Without Exposed Support Structures Wound Margin: Distinct, outline attached Exudate Amount: None Present Foul Odor After Cleansing: No Slough/Fibrino No Wound Bed Granulation Amount: None Present (0%) Exposed Structure Necrotic Amount: None Present (0%) Fascia Exposed: No Fat Layer (Subcutaneous Tissue) Exposed: No Tendon Exposed: No Muscle Exposed: No Joint Exposed: No Bone Exposed: No Electronic Signature(s) Signed: 05/23/2022 4:30:31 PM By: Sharyn Creamer RN, BSN Entered By: Sharyn Creamer on 05/23/2022 14:17:45 -------------------------------------------------------------------------------- Wound Assessment Details Patient Name: Date of Service: [name], [name] 05/23/2022 1:15 PM Medical Record Number: [id number] Patient Account Number: [id number] Date of Birth/Sex: Treating RN: 1951/01/22 (71 y.o. [name] Primary Care Asia Dusenbury: Stark Klein Other Clinician: Referring Yzabelle Calles: Treating Margarete Horace/Extender: [name] Weeks in Treatment: 7 Wound Status Wound Number: 9 Primary Diabetic Wound/Ulcer of the Lower Extremity Etiology: Wound Location: Right, Anterior Lower Leg Wound Open Wounding Event: Blister Status: Date Acquired: 05/23/2022 Comorbid Cataracts, Anemia, Sleep Apnea, Congestive Heart Failure, Weeks Of Treatment: 0 History: Hypertension, Type I Diabetes, Received Radiation Clustered Wound: No Photos Wound Measurements Length: (cm)  0.5 Width: (cm) 0.5 Depth: (cm) 0.1 Area: (cm) 0.196 Volume: (cm) 0.02 % Reduction in Area: 0% % Reduction in Volume: 0% Epithelialization: None Tunneling: No Undermining: No Wound Description Classification: Grade 2 Wound Margin: Flat and Intact Exudate Amount: Medium Exudate Type: Serosanguineous Exudate Color: red, brown Foul Odor After Cleansing: No Slough/Fibrino Yes Wound Bed Granulation Amount: Small (1-33%) Exposed Structure Granulation Quality: Red Fascia Exposed: No Necrotic Amount: Large (67-100%) Fat Layer (Subcutaneous Tissue) Exposed: Yes Necrotic Quality: Adherent Slough Tendon Exposed: No Muscle Exposed: No Joint Exposed: No Bone Exposed: No Treatment Notes Wound #9 (Lower Leg) Wound Laterality: Right, Anterior Cleanser Soap and Water Discharge Instruction: May shower and wash wound with dial antibacterial soap and water prior to dressing change. Peri-Wound Care Sween Lotion (Moisturizing lotion) Discharge Instruction: Apply moisturizing lotion as directed Topical Primary Dressing KerraCel Ag Gelling Fiber Dressing, 4x5 in (silver alginate) Discharge Instruction: Apply silver alginate to wound bed as instructed Secondary Dressing ALLEVYN Heel 4 1/2in x 5 1/2in / 10.5cm x 13.5cm Discharge Instruction: Apply over primary dressing as directed. Woven Gauze Sponge, Non-Sterile 4x4 in Discharge Instruction: Apply over primary dressing as directed. Secured With [employer], 4.5x3.1 (in/yd) Discharge Instruction: Secure  with Kerlix as directed. 35M Medipore Soft Cloth Surgical T 2x10 (in/yd) ape Discharge Instruction: Secure with tape as directed. Compression Wrap Compression Stockings Add-Ons Electronic Signature(s) Signed: 05/23/2022 4:30:31 PM By: Sharyn Creamer RN, BSN Entered By: Sharyn Creamer on 05/23/2022 14:18:19 -------------------------------------------------------------------------------- Vitals Details Patient Name: Date of  Service: [name], [name] 05/23/2022 1:15 PM Medical Record Number: [id number] Patient Account Number: [id number] Date of Birth/Sex: Treating RN: [birth year] (71 y.o. [name] Primary Care Siddarth Hsiung: Stark Klein Other Clinician: Referring Hemi Chacko: Treating Kaylla Cobos/Extender: [name] Weeks in Treatment: 7 Vital Signs Time Taken: 13:58 Temperature (F): 98.0 Height (in): 67 Pulse (bpm): 50 Weight (lbs): 243 Respiratory Rate (breaths/min): 20 Body Mass Index (BMI): 38.1 Blood Pressure (mmHg): 114/68 Capillary Blood Glucose (mg/dl): 198 Reference Range: 80 - 120 mg / dl Electronic Signature(s) Signed: 05/23/2022 4:30:31 PM By: Sharyn Creamer RN, BSN Entered By: Sharyn Creamer on 05/23/2022 14:02:03

## 2022-05-26 ENCOUNTER — Other Ambulatory Visit: Payer: Self-pay | Admitting: Family Medicine

## 2022-05-29 ENCOUNTER — Other Ambulatory Visit: Payer: Self-pay

## 2022-05-29 ENCOUNTER — Encounter (HOSPITAL_COMMUNITY): Payer: Self-pay | Admitting: Emergency Medicine

## 2022-05-29 ENCOUNTER — Telehealth: Payer: Self-pay

## 2022-05-29 ENCOUNTER — Other Ambulatory Visit: Payer: Self-pay | Admitting: Family Medicine

## 2022-05-29 ENCOUNTER — Emergency Department (HOSPITAL_COMMUNITY)
Admission: EM | Admit: 2022-05-29 | Discharge: 2022-05-29 | Disposition: A | Discharge status: Home / Self Care | Payer: Medicare PPO | Attending: Emergency Medicine | Admitting: Emergency Medicine

## 2022-05-29 ENCOUNTER — Emergency Department (HOSPITAL_COMMUNITY): Payer: Medicare PPO

## 2022-05-29 ENCOUNTER — Other Ambulatory Visit: Payer: Self-pay | Admitting: Adult Health

## 2022-05-29 ENCOUNTER — Other Ambulatory Visit (HOSPITAL_COMMUNITY): Payer: Self-pay

## 2022-05-29 DIAGNOSIS — Z79899 Other long term (current) drug therapy: Secondary | ICD-10-CM | POA: Insufficient documentation

## 2022-05-29 DIAGNOSIS — R059 Cough, unspecified: Secondary | ICD-10-CM | POA: Insufficient documentation

## 2022-05-29 DIAGNOSIS — I509 Heart failure, unspecified: Secondary | ICD-10-CM | POA: Diagnosis not present

## 2022-05-29 DIAGNOSIS — E039 Hypothyroidism, unspecified: Secondary | ICD-10-CM | POA: Diagnosis not present

## 2022-05-29 DIAGNOSIS — R0602 Shortness of breath: Secondary | ICD-10-CM | POA: Insufficient documentation

## 2022-05-29 DIAGNOSIS — Z7901 Long term (current) use of anticoagulants: Secondary | ICD-10-CM | POA: Diagnosis not present

## 2022-05-29 DIAGNOSIS — Z91040 Latex allergy status: Secondary | ICD-10-CM | POA: Insufficient documentation

## 2022-05-29 DIAGNOSIS — I10 Essential (primary) hypertension: Secondary | ICD-10-CM | POA: Diagnosis not present

## 2022-05-29 DIAGNOSIS — I959 Hypotension, unspecified: Secondary | ICD-10-CM | POA: Diagnosis not present

## 2022-05-29 DIAGNOSIS — Z950 Presence of cardiac pacemaker: Secondary | ICD-10-CM | POA: Diagnosis not present

## 2022-05-29 DIAGNOSIS — Z794 Long term (current) use of insulin: Secondary | ICD-10-CM | POA: Diagnosis not present

## 2022-05-29 DIAGNOSIS — R55 Syncope and collapse: Secondary | ICD-10-CM | POA: Diagnosis not present

## 2022-05-29 DIAGNOSIS — R202 Paresthesia of skin: Secondary | ICD-10-CM | POA: Diagnosis not present

## 2022-05-29 DIAGNOSIS — J811 Chronic pulmonary edema: Secondary | ICD-10-CM | POA: Diagnosis not present

## 2022-05-29 DIAGNOSIS — K5901 Slow transit constipation: Secondary | ICD-10-CM

## 2022-05-29 DIAGNOSIS — E109 Type 1 diabetes mellitus without complications: Secondary | ICD-10-CM | POA: Insufficient documentation

## 2022-05-29 DIAGNOSIS — Z20822 Contact with and (suspected) exposure to covid-19: Secondary | ICD-10-CM | POA: Diagnosis not present

## 2022-05-29 LAB — BASIC METABOLIC PANEL
Anion gap: 8 (ref 5–15)
BUN: 10 mg/dL (ref 8–23)
CO2: 29 mmol/L (ref 22–32)
Calcium: 9.1 mg/dL (ref 8.9–10.3)
Chloride: 105 mmol/L (ref 98–111)
Creatinine, Ser: 1.07 mg/dL — ABNORMAL HIGH (ref 0.44–1.00)
GFR, Estimated: 56 mL/min — ABNORMAL LOW (ref >60)
Glucose, Bld: 228 mg/dL — ABNORMAL HIGH (ref 70–99)
Potassium: 3.6 mmol/L (ref 3.5–5.1)
Sodium: 142 mmol/L (ref 135–145)

## 2022-05-29 LAB — CBC
HCT: 35.5 % — ABNORMAL LOW (ref 36.0–46.0)
Hemoglobin: 10.9 g/dL — ABNORMAL LOW (ref 12.0–15.0)
MCH: 28.5 pg (ref 26.0–34.0)
MCHC: 30.7 g/dL (ref 30.0–36.0)
MCV: 92.7 fL (ref 80.0–100.0)
Platelets: 203 10*3/uL (ref 150–400)
RBC: 3.83 MIL/uL — ABNORMAL LOW (ref 3.87–5.11)
RDW: 15.1 % (ref 11.5–15.5)
WBC: 4.7 10*3/uL (ref 4.0–10.5)
nRBC: 0 % (ref 0.0–0.2)

## 2022-05-29 LAB — TROPONIN I (HIGH SENSITIVITY): Troponin I (High Sensitivity): 22 ng/L — ABNORMAL HIGH (ref <18)

## 2022-05-29 LAB — SARS CORONAVIRUS 2 BY RT PCR: SARS Coronavirus 2 by RT PCR: NEGATIVE

## 2022-05-29 LAB — BRAIN NATRIURETIC PEPTIDE: B Natriuretic Peptide: 190.5 pg/mL — ABNORMAL HIGH (ref 0.0–100.0)

## 2022-05-29 LAB — CBG MONITORING, ED
Glucose-Capillary: 96 mg/dL (ref 70–99)
Glucose-Capillary: 122 mg/dL — ABNORMAL HIGH (ref 70–99)

## 2022-05-29 LAB — LACTIC ACID, PLASMA: Lactic Acid, Venous: 2.4 mmol/L (ref 0.5–1.9)

## 2022-05-29 NOTE — ED Triage Notes (Signed)
Patient BIB [facility] from home for shortness of breath and near syncope. EMS states they were told a family member was assisting her to the restroom when she started complaining of shortness of breath. No focal deficit. Patient is alert and in no apparent distress at this time. 100% on 3L O2 [place]. CBG 277.

## 2022-05-29 NOTE — ED Provider Triage Note (Addendum)
Emergency Medicine Provider Triage Evaluation Note  [name] , a 71 y.o. female  was evaluated in triage.  Pt complains of weakness Review of Systems  Positive: dizziness Negative: No injuries  Physical Exam  BP (!) 122/106 (BP Location: Right Arm)   Pulse (!) 50   Temp 98.3 F (36.8 C) (Oral)   Resp 20   SpO2 100%  Gen:   Awake, no distress   Resp:  Normal effort  MSK:     Medical Decision Making  Medically screening exam initiated at 1:34 PM.  Appropriate orders placed.  [name] was informed that the remainder of the evaluation will be completed by another provider, this initial triage assessment does not replace that evaluation, and the importance of remaining in the ED until their evaluation is complete.     Fransico Meadow, PA-C 05/29/22 1337    Fransico Meadow, [place] 05/29/22 1339

## 2022-05-29 NOTE — Discharge Instructions (Signed)
Your history and exam today did not reveal convincing evidence of pneumonia or cardiac cause of your symptoms.  We discussed that your fluid overload test called a BNP was slightly improved from prior although your x-ray did show some mild congestion/edema.  With your decrease in urination, we tried to get urinalysis but unfortunately were unable to do so.  Rather than wait for recollection, you prefer to go home.  You have proven stability for nearly 10 hours in the emergency department without any worsened oxygen requirement or without further symptoms.  We discussed we could do further work-up and imaging however given your stability and desire to be discharged, we feel you are safe for discharge home.  If any symptoms are to change or worsen acutely, please return to the nearest emergency department for further work-up and management.

## 2022-05-29 NOTE — ED Notes (Signed)
Called lab regarding delayed results of BNP and troponin. Lab said they would readd the labs and add them to our collection.

## 2022-05-29 NOTE — ED Notes (Signed)
Discharge instructions reviewed with patient. Patient verbalized understanding of instructions. Follow-up care and medications were reviewed. Patient ambulatory with steady gait. VSS upon discharge.  ?

## 2022-05-29 NOTE — ED Notes (Signed)
Called sister [name] to notify her of this pt's discharge. Per family member, she will come as soon as she can to pick up her sister.

## 2022-05-29 NOTE — ED Provider Notes (Signed)
[facility] EMERGENCY DEPARTMENT Provider Note   CSN: [id number] Arrival date & time: 05/29/22  1304     History  Chief Complaint  Patient presents with   Shortness of Breath    [name] is a 71 y.o. female.  The history is provided by the patient and medical records. No language interpreter was used.  Shortness of Breath Severity:  Moderate Onset quality:  Gradual Duration:  3 days Timing:  Constant Progression:  Worsening Chronicity:  Recurrent Context: URI   Relieved by:  Nothing Worsened by:  Nothing Ineffective treatments:  None tried Associated symptoms: cough   Associated symptoms: no abdominal pain, no chest pain (tighness), no diaphoresis, no fever, no headaches, no neck pain, no rash, no sputum production, no vomiting and no wheezing       Home Medications Prior to Admission medications   Medication Sig Start Date End Date Taking? Authorizing Provider  acetaminophen (TYLENOL) 325 MG tablet Take 2 tablets (650 mg total) by mouth every 6 (six) hours as needed for moderate pain. 08/30/21   Angiulli, Lavon Paganini, PA-C  apixaban (ELIQUIS) 5 MG TABS tablet Take 1 tablet (5 mg total) by mouth 2 (two) times daily. 11/16/21   [name], [name], MD  atorvastatin (LIPITOR) 80 MG tablet Take 1 tablet (80 mg total) by mouth every evening. 11/16/21   [name], Makiera, MD  busPIRone (BUSPAR) 5 MG tablet Take 5 mg by mouth daily.    [provider]  Fexofenadine HCl (ALLEGRA PO) Take 10 mg by mouth 2 (two) times daily.    [provider]  fluticasone (FLONASE) 50 MCG/ACT nasal spray Place 1 spray into both nostrils daily. Patient not taking: Reported on 03/02/2022 02/02/22   Lurline Del, DO  furosemide (LASIX) 40 MG tablet Take 1 tablet (40 mg total) by mouth 2 (two) times daily. 01/20/22   Baldwin Jamaica, PA-C  gabapentin (NEURONTIN) 300 MG capsule Take 300 mg by mouth as needed.    [provider]   insulin aspart (NOVOLOG) 100 UNIT/ML injection Inject 5 Units into the skin 3 (three) times daily before meals. For blood sugar greater than or equal to 200 10/27/21   Medina-Vargas, Monina C, NP  Insulin Human (INSULIN PUMP) SOLN Inject 1 each into the skin 3 times daily with meals, bedtime and 2 AM. 08/10/21   Sharion Settler, DO  levothyroxine (SYNTHROID) 175 MCG tablet Take 1 tablet (175 mcg total) by mouth daily before breakfast. 11/16/21   [name], [name], MD  loratadine (CLARITIN) 10 MG tablet Take 10 mg by mouth every evening.    [provider]  metoprolol succinate (TOPROL-XL) 50 MG 24 hr tablet Take 1 tablet (50 mg total) by mouth daily. Take with or immediately following a meal. 02/16/22   Eppie Gibson, MD  pantoprazole (PROTONIX) 40 MG tablet Take 1 tablet (40 mg total) by mouth 2 (two) times daily. 10/27/21   Medina-Vargas, Monina C, NP  polyethylene glycol powder (GLYCOLAX/MIRALAX) 17 GM/SCOOP powder Take 17 g by mouth daily. Patient taking differently: Take 17 g by mouth as needed. 10/27/21   Medina-Vargas, Monina C, NP  potassium chloride (KLOR-CON) 10 MEQ tablet Take 1 tablet (10 mEq total) by mouth daily. 10/27/21   Medina-Vargas, Monina C, NP      Allergies    Latex    Review of Systems   Review of Systems  Constitutional:  Positive for fatigue. Negative for chills, diaphoresis and fever.  HENT:  Negative for  congestion.   Eyes:  Negative for visual disturbance.  Respiratory:  Positive for cough, chest tightness and shortness of breath. Negative for sputum production, wheezing and stridor.   Cardiovascular:  Negative for chest pain (tighness), palpitations and leg swelling.  Gastrointestinal:  Negative for abdominal pain, constipation, diarrhea, nausea and vomiting.  Genitourinary:  Negative for dysuria and flank pain.  Musculoskeletal:  Negative for back pain, neck pain and neck stiffness.  Skin:  Negative for rash and wound.  Neurological:   Positive for light-headedness. Negative for dizziness, weakness, numbness and headaches.  Psychiatric/Behavioral:  Negative for agitation.   All other systems reviewed and are negative.  Physical Exam Updated Vital Signs BP (!) 125/98 (BP Location: Right Arm)   Pulse 60   Temp 98.1 F (36.7 C) (Oral)   Resp 20   SpO2 100%  Physical Exam Vitals and nursing note reviewed.  Constitutional:      General: She is not in acute distress.    Appearance: She is well-developed. She is not ill-appearing, toxic-appearing or diaphoretic.  HENT:     Head: Normocephalic and atraumatic.     Mouth/Throat:     Mouth: Mucous membranes are moist.  Eyes:     Extraocular Movements: Extraocular movements intact.     Conjunctiva/sclera: Conjunctivae normal.     Pupils: Pupils are equal, round, and reactive to light.  Cardiovascular:     Rate and Rhythm: Normal rate and regular rhythm.     Heart sounds: No murmur heard. Pulmonary:     Effort: Pulmonary effort is normal. No tachypnea or respiratory distress.     Breath sounds: Rhonchi and rales present. No wheezing.  Chest:     Chest wall: No tenderness.  Abdominal:     Palpations: Abdomen is soft.     Tenderness: There is no abdominal tenderness.  Musculoskeletal:        General: No swelling.     Cervical back: Neck supple.     Right lower leg: No tenderness.     Left lower leg: No tenderness.  Skin:    General: Skin is warm and dry.     Capillary Refill: Capillary refill takes less than 2 seconds.     Findings: No erythema.  Neurological:     General: No focal deficit present.     Mental Status: She is alert.  Psychiatric:        Mood and Affect: Mood normal. Mood is not anxious.    ED Results / Procedures / Treatments   Labs (all labs ordered are listed, but only abnormal results are displayed) Labs Reviewed  BASIC METABOLIC PANEL - Abnormal; Notable for the following components:      Result Value   Glucose, Bld 228 (*)     Creatinine, Ser 1.07 (*)    GFR, Estimated 56 (*)    All other components within normal limits  CBC - Abnormal; Notable for the following components:   RBC 3.83 (*)    Hemoglobin 10.9 (*)    HCT 35.5 (*)    All other components within normal limits  BRAIN NATRIURETIC PEPTIDE - Abnormal; Notable for the following components:   B Natriuretic Peptide 190.5 (*)    All other components within normal limits  LACTIC ACID, PLASMA - Abnormal; Notable for the following components:   Lactic Acid, Venous 2.4 (*)    All other components within normal limits  CBG MONITORING, ED - Abnormal; Notable for the following components:   Glucose-Capillary 122 (*)  All other components within normal limits  TROPONIN I (HIGH SENSITIVITY) - Abnormal; Notable for the following components:   Troponin I (High Sensitivity) 22 (*)    All other components within normal limits  SARS CORONAVIRUS 2 BY RT PCR  URINE CULTURE  URINALYSIS, ROUTINE W REFLEX MICROSCOPIC  TSH  LACTIC ACID, PLASMA  CBG MONITORING, ED  TROPONIN I (HIGH SENSITIVITY)    EKG EKG Interpretation  Date/Time:  Monday May 29 2022 13:26:12 EDT Ventricular Rate:  50 PR Interval:    QRS Duration: 170 QT Interval:  544 QTC Calculation: 495 R Axis:   101 Text Interpretation: Ventricular-paced rhythm Abnormal ECG When compared with ECG of 10-Feb-2022 12:10, PREVIOUS ECG IS PRESENT when compared to prior, similar paced rhythm No STEMI Confirmed by Antony Blackbird [phone]) on 05/29/2022 6:49:45 PM  Radiology DG Chest Portable 1 View  Result Date: 05/29/2022 CLINICAL DATA:  Cough, shortness of breath EXAM: PORTABLE CHEST 1 VIEW COMPARISON:  02/10/2022 FINDINGS: Left-sided implanted cardiac device. Stable mild cardiomegaly. Aortic atherosclerosis. Mild pulmonary vascular congestion. No overt pulmonary edema. No focal consolidation. No pleural effusion or pneumothorax. IMPRESSION: Cardiomegaly and mild pulmonary vascular congestion without overt  pulmonary edema. Electronically Signed   By: Davina Poke D.O.   On: 05/29/2022 19:31    Procedures Procedures    Medications Ordered in ED Medications - No data to display  ED Course/ Medical Decision Making/ A&P                           Medical Decision Making Amount and/or Complexity of Data Reviewed Labs: ordered. Radiology: ordered.    [name] is a 71 y.o. female with a past medical history significant for GERD, heart failure, type 1 diabetes with insulin pump, hyperlipidemia, pacemaker, thyroid disease with hypothyroidism, paroxysmal atrial fibrillation on Eliquis therapy, and previous stroke who presents with worsening shortness of breath and fatigue.  Patient reports that she could not catch her breath today and was feeling winded.  She reports she occasionally use oxygen at home but has increased it from 1, 2, 10 now 3 L nasal cannula to maintain oxygen saturations.  She reports still feeling short of breath.  Does report she had a nonproductive cough for the last few days but denies fevers or chills.  She denies any chest pain but does have some chest tightness.  She denies any nausea, vomiting, or diarrhea but does report some constipation.  She denies any abdominal pain or flank pain.  Denies any urinary changes.  She is unsure if she is fluid overloaded but feels that she was very fatigued today and short of breath which was different.  On exam, lungs do have some rales and rhonchi.  Chest and back were nontender.  Abdomen was nontender.  Normal bowel sounds.  Legs are wrapped with compression stockings but she has intact sensation and strength.  Good palpate pulses.  EKG did not show STEMI and showed paced rhythm.  Clinically I suspect patient has either pneumonia or fluid overload causing her worsening shortness of breath and fatigue.  Anticipate work-up to rule out acute infection or other abnormality.  Anticipate reassessment to determine disposition  when work-up is completed.  10:52 PM Work-up is mostly returned.  Troponin is similar to prior.  BNP is improved from prior.  Lactic acid is slightly elevated however patient is denying any further complaints.  She does not have a leukocytosis and is afebrile.  She does not think she has pneumonia.  We discussed the possibility of occult infection or kidney pneumonia for which we may need CT imaging but she would rather go home.  She reports she can see her doctor tomorrow and her oxygen remains on 3 L which she reports she can use at home.  A urinalysis was not completed although she is denying any urinary symptoms at this time.  She denies any hematuria, dysuria, or urine change in appearance.  She would like to go home and follow-up with her primary doctor tomorrow.  Although patient has increased her oxygen requirement slightly, we do feel she is safe for discharge home given her stability for over 9-1/2 hours in the emergency department and improvement in her symptoms.  We again discussed further work-up with urinalysis or imaging or admission but she would rather go home.  She understands return if any symptoms change or worsen.  On my last evaluation blood pressure was in the 130s and she is having no complaints.  Patient will be discharged for mild shortness of breath that has improved and without imaging evidence of acute infection at this time.          Final Clinical Impression(s) / ED Diagnoses Final diagnoses:  SOB (shortness of breath)    Clinical Impression: 1. SOB (shortness of breath)     Disposition: Discharge  Condition: Good  I have discussed the results, Dx and Tx plan with the pt(& family if present). He/she/they expressed understanding and agree(s) with the plan. Discharge instructions discussed at great length. Strict return precautions discussed and pt &/or family have verbalized understanding of the instructions. No further questions at time of discharge.     New Prescriptions   No medications on file    Follow Up: your PCP tomorrow        Camil Hausmann, Gwenyth Allegra, MD 05/29/22 2304

## 2022-05-29 NOTE — Telephone Encounter (Signed)
Patients niece calls nurse line requesting to speak with PCP discretely.   [name] reports she would like to have evaluated for cognitive function.   [name] reports the family has noticed things over the last several months, however the patient becomes very defensive when mentioned.   Please call [name] at your convenience.    [phone]

## 2022-05-30 ENCOUNTER — Encounter: Payer: Self-pay | Admitting: *Deleted

## 2022-05-30 ENCOUNTER — Encounter (HOSPITAL_BASED_OUTPATIENT_CLINIC_OR_DEPARTMENT_OTHER): Payer: Medicare PPO | Admitting: General Surgery

## 2022-05-30 LAB — TSH: TSH: 2.658 u[IU]/mL (ref 0.350–4.500)

## 2022-05-30 NOTE — Progress Notes (Signed)
[facility] Telemedicine Visit  Patient consented to have virtual visit and was identified by name and date of birth. Method of visit: Video  Encounter participants: Patient: [name] - located at home address  Provider: Eulis Foster - located at [facility]   Chief Complaint: f/u from ED Visit   HPI: Patient was evaluated in the ED on 05/28/22 for SOB. She noted to recently have increased her oxygen requirement from 1 to 3 liters. CXR from the ED was unremarkable for PNA and BNP was improved from 289 to 190 two days ago. Patient opted to return home from the ED although further observation was recommended.   Today she states that her oxygen level has increased to needing 3 liters. She is also requesting a script to have portable oxygen when going shopping. She reports that she was told she could keep the oxygen for 2 weeks but would like to keep the oxygen permanently. She states that her oxygen level is 96% when she increased her oxygen to 1 liter. She is currently using 2.5L. She denies currently feeling SOB. She reports feeling more SOB a few days ago when ambulating to the restroom. She states that she has been gasping for air two weeks ago. She noticed that she had a hard time swallowing after having PCN for a tooth extraction. Patient states that she may be experiencing more anxiety as well.  She denies increased leg swelling.    ROS: per HPI  Pertinent PMHx:  HFrEF T1DM  Afib  Left [place] Stroke  OSA  Wheel chair dependent  Exam:  There were no vitals taken for this visit.  Respiratory: speaking in complete sentences  Oxygen saturation is 89% on 1, 96% on 2.5L   Assessment/Plan:  Hypoxia Previous hypoxia noted on 1 L supplemental oxygen down to 80s, patient reports improved symptoms and oxygen saturation of 96% ono 2.5 liters today  Will submit order for portable oxygen so patient can have supply while running errands on  permanent basis  Patient scheduled for in person follow up in two weeks     Time spent during visit with patient: 16 minutes

## 2022-05-31 ENCOUNTER — Encounter: Payer: Self-pay | Admitting: Family Medicine

## 2022-05-31 ENCOUNTER — Telehealth (INDEPENDENT_AMBULATORY_CARE_PROVIDER_SITE_OTHER): Payer: Medicare PPO | Admitting: Family Medicine

## 2022-05-31 DIAGNOSIS — R0902 Hypoxemia: Secondary | ICD-10-CM

## 2022-05-31 DIAGNOSIS — G4734 Idiopathic sleep related nonobstructive alveolar hypoventilation: Secondary | ICD-10-CM | POA: Diagnosis not present

## 2022-05-31 NOTE — Telephone Encounter (Addendum)
Returned call to Patient's niece, Mancel Bale.   She reports concern for patient's thought process that does not seem to be as coherent. She expresses concern for the patient saying unrelated topics during group conversation or that she sometimes is "staring off into space". She notes that it does not appear the patient is declining in her cognitive function. She notes that the patient is often defensive when the topic is brought up.   Offered to have patient and family scheduled with geriatric clinic to which [facility] was agreeable to discussing with the patient and other family members.   Eulis Foster, MD [place], PGY-3 [phone]

## 2022-06-03 NOTE — Assessment & Plan Note (Signed)
Previous hypoxia noted on 1 L supplemental oxygen down to 80s, patient reports improved symptoms and oxygen saturation of 96% ono 2.5 liters today  Will submit order for portable oxygen so patient can have supply while running errands on permanent basis  Patient scheduled for in person follow up in two weeks

## 2022-06-04 NOTE — Progress Notes (Deleted)
Cardiology Office Note Date:  06/04/2022  Patient ID:  [name], DOB Jul 10, 1951, MRN [id number] PCP:  Eulis Foster, MD  Cardiologist:  Dr. Marlou Porch EP: Dr. Curt Bears    Chief Complaint:  *** planned f/u  History of Present Illness: [name] is a 71 y.o. female with history of HTN, DM (type I), HLD, thyroid cancer s/p thyroidectomy, stroke (2016 and 2019), GERD, normal coronaries by cath in 2013, HFpEF, significant atrial ectopy, and via loop found to have AFib. >> symptomatic bradycardia >> PPM   She comes in today to be seen for Dr. Curt Bears, last seen by him July 2022, doing well, no changes were made  She saw [name], PA-C 11/14/21, she was generally doing well from a cardiac perspective, she had a fall recently with some fractured ribs, mobilizing slowly, discussed some personal concerns/burdening family, though all in all doing ok Using her BIPAP at night, no changes were made.  I saw her 11/23/21 No changes since she saw Johann Capers the other day No new symptoms or concerns No near syncope or syncope No bleeding or signs of bleeding During the visit she was in/out of an AFlutter/Atach (v paced) rhythm without awareness Her amlodipine reduced and toprol started Planned to see her back and revisit rhythm/burden and perhaps transition to BB alone  Following with neurology w/hx of strokes, working on core strength to improve gait stability  I saw her 01/20/22 She is doing "fine!" She has no CP or cardiac awareness, says if she is having Afib she doesn't know it. She reports essentially nonambulatory, often seated ,elevated her feet some She is edematous, mentions she is NOT taking Torsemide as the list says, that 2 weeks ago she was changed to furosemide '20mg'$  BID, she says by her endocrinologist. She denies SOB but the LE swelling is not improved. She reports that she tends to have some swelling, but this more then usual No dizzy spells,  no near syncope or syncope.  She was unaware of her AF, burden was 12%, planned to reduce her CCB and increase BB She was edematous, furosemide increased, planned to f/u with cardiology team and with EP in a couple months to revisit her AF burden, rates.  She was hospitalized 02/10/22 - 02/15/22 with SOB and LE edema, CHF.  There was some discussion of noncompliance with lasix and home BIPAP/OSA management Also treated for UTI Echo noted showed pulmonary HTN (PASP 75) possibly secondary to hypoxemia from uncontrolled OSA. Pulmonology was consulted and recommended to continue with BiPAP at night. ABG was ordered and was unremarkable without evidence of hypercarbia. Discharge on O2, home lasix dosing, urged compliance with meds, BIPAP   TODAY She is alone, her niece brought her, though waiting outside. Her sister is helping take care of her medicines and taking all of them as instructed. She missed her PMD hospital follow up visit. She reports that she is using her BIPAP at night every night and the O2 in the day.  [name] BIPAP dries her mouth. She denies any cardiac concerns. She says her breathing continues to be better since the hospital She does not ambulate, has not had any resting SOB She has a couple wounds on he LE/foot, she is getting home PT/OT, RN as well She mentions that last week her urine output seemed to slow, but this week is back up. No burning/pain or symptoms of UTI No near syncope or syncope. She mentions that they told her to inquire about  her metoprolol because her HR is in the 50's And if she still needs to be on amlodipine. She denies any bleeding or signs of bleeding Gt new teeth, they fit her better Her AFib burden was up, though suspected with better medication and BIPAP compliance this may improve and no AAD added  05/29/22: ER visit with increased SOB and O2 requirements, w/u largely unrevealing by labs, imaging, recommended CT though she declined and preferred to  f/u with her MD the following day Seems feeling better without any interventions done There was a phone visit with IM, planned for portable O2 and 2 week f/u.  Family reported concerns of declining MS?  *** AF burden *** eliquis, bleeding *** pulm?  *** sees skains in Aug  Device information MDT ILR, implanted 12/23/2018 (remains implanted) MDT dual chamber PPM implanted 06/25/20   Past Medical History:  Diagnosis Date   Abnormal echocardiogram    Anemia    Arthritis    BPPV (benign paroxysmal positional vertigo), right 06/13/2017   Bradycardia 03/07/2020   Cerebrovascular accident (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 ([place]) 10/29/2015   CHB (complete heart block) ([place]) 03/08/2020   CHF (congestive heart failure) ([place])    Closed fracture of left proximal humerus 07/08/2019   Decreased range of motion of left shoulder 09/03/2020   Depression    Dizzy 03/07/2020   bradycardia   Edentulous 09/03/2020   GERD (gastroesophageal reflux disease)    Hemiparesis affecting left side as late effect of cerebrovascular accident (CVA) ([place]) 03/08/2020   Hemorrhoids    History of Falls with injury 03/08/2020   Fall resulting in left humeral fracture   Hyperlipidemia    Hypertension    Hypothyroidism    Intracranial vascular stenosis    Lacunar infarction ([place])    Brain MRI 07/2021 multiple small remote pontine lacunar infarcts   Near syncope 03/08/2020   Paroxysmal atrial fibrillation ([facility])    Presence of permanent cardiac pacemaker    Proteinuria 03/08/2020   Seasonal allergies    Stroke ([place])    Thyroid cancer ([place])    per pt S/p Total Thyroidectomy with Radioactive Iodine Therapy   Type 1 diabetes mellitus ([place])     Past Surgical History:  Procedure Laterality Date   ABDOMINAL ANGIOGRAM N/A 05/14/2012   Procedure: ABDOMINAL ANGIOGRAM;  Surgeon: Laverda Page, MD;  Location: [facility] CATH LAB;  Service: Cardiovascular;  Laterality: N/A;   ABDOMINAL HYSTERECTOMY  1995    partial   ANKLE FRACTURE SURGERY Right    CARDIAC CATHETERIZATION     with coronary angiogram   cataract  Bilateral 2018   COLONOSCOPY N/A 09/09/2014   Procedure: COLONOSCOPY;  Surgeon: Gatha Mayer, MD;  Location: [place];  Service: Endoscopy;  Laterality: N/A;   ESOPHAGOGASTRODUODENOSCOPY (EGD) WITH PROPOFOL N/A 12/03/2018   Procedure: ESOPHAGOGASTRODUODENOSCOPY (EGD) WITH PROPOFOL;  Surgeon: Doran Stabler, MD;  Location: [place];  Service: Gastroenterology;  Laterality: N/A;   LEFT AND RIGHT HEART CATHETERIZATION WITH CORONARY ANGIOGRAM N/A 05/14/2012   Procedure: LEFT AND RIGHT HEART CATHETERIZATION WITH CORONARY ANGIOGRAM;  Surgeon: Laverda Page, MD;  Location[facility] CATH LAB;  Service: Cardiovascular;  Laterality: N/A;   LOOP RECORDER INSERTION N/A 12/23/2018   Procedure: LOOP RECORDER INSERTION;  Surgeon: Constance Haw, MD;  Location: [place] CV LAB;  Service: Cardiovascular;  Laterality: N/A;   LOOP RECORDER REMOVAL N/A 06/25/2020   Procedure: LOOP RECORDER REMOVAL;  Surgeon: Constance Haw, MD;  Location: [place]  CV LAB;  Service: Cardiovascular;  Laterality: N/A;   MINI METER   09/08/2014   ELECTRONIC INSULIN PUMP   PACEMAKER IMPLANT N/A 06/25/2020   Procedure: PACEMAKER IMPLANT;  Surgeon: Constance Haw, MD;  Location: [place] CV LAB;  Service: Cardiovascular;  Laterality: N/A;   TEE WITHOUT CARDIOVERSION N/A 10/16/2018   Procedure: TRANSESOPHAGEAL ECHOCARDIOGRAM (TEE);  Surgeon: Buford Dresser, MD;  Location: [facility] ENDOSCOPY;  Service: Cardiovascular;  Laterality: N/A;   THYROIDECTOMY  2006   for thyroid cancer    Current Outpatient Medications  Medication Sig Dispense Refill   acetaminophen (TYLENOL) 325 MG tablet Take 2 tablets (650 mg total) by mouth every 6 (six) hours as needed for moderate pain.     apixaban (ELIQUIS) 5 MG TABS tablet Take 1 tablet (5 mg total) by mouth 2 (two) times daily. 180 tablet 0    atorvastatin (LIPITOR) 80 MG tablet Take 1 tablet (80 mg total) by mouth every evening. 90 tablet 1   busPIRone (BUSPAR) 5 MG tablet Take 5 mg by mouth daily.     Fexofenadine HCl (ALLEGRA PO) Take 10 mg by mouth 2 (two) times daily.     fluticasone (FLONASE) 50 MCG/ACT nasal spray Place 1 spray into both nostrils daily. (Patient not taking: Reported on 03/02/2022) 16 g 12   furosemide (LASIX) 40 MG tablet Take 1 tablet (40 mg total) by mouth 2 (two) times daily. 180 tablet 3   gabapentin (NEURONTIN) 300 MG capsule Take 300 mg by mouth as needed.     insulin aspart (NOVOLOG) 100 UNIT/ML injection Inject 5 Units into the skin 3 (three) times daily before meals. For blood sugar greater than or equal to 200 10 mL 11   Insulin Human (INSULIN PUMP) SOLN Inject 1 each into the skin 3 times daily with meals, bedtime and 2 AM.     levothyroxine (SYNTHROID) 175 MCG tablet Take 1 tablet (175 mcg total) by mouth daily before breakfast. 30 tablet 0   loratadine (CLARITIN) 10 MG tablet Take 10 mg by mouth every evening.     metoprolol succinate (TOPROL-XL) 50 MG 24 hr tablet Take 1 tablet (50 mg total) by mouth daily. Take with or immediately following a meal. 30 tablet 0   pantoprazole (PROTONIX) 40 MG tablet Take 1 tablet (40 mg total) by mouth 2 (two) times daily. 60 tablet 0   polyethylene glycol powder (GLYCOLAX/MIRALAX) 17 GM/SCOOP powder Take 17 g by mouth daily. (Patient taking differently: Take 17 g by mouth as needed.) 507 g 0   potassium chloride (KLOR-CON) 10 MEQ tablet Take 1 tablet (10 mEq total) by mouth daily. 30 tablet 0   No current facility-administered medications for this visit.    Allergies:   Latex   Social History:  The patient  reports that she has never smoked. She has never used smokeless tobacco. She reports that she does not drink alcohol and does not use drugs.   Family History:  The patient's family history includes Diabetes in her maternal aunt; Early death in her father; Heart  attack in her maternal grandfather and mother; Heart disease in her maternal aunt, maternal grandfather, and mother; Heart failure in her maternal grandfather; Hyperlipidemia in her father and mother; Hypertension in her father and mother; Prostate cancer in her paternal uncle; Renal Disease in her mother; Stroke in her son.  ROS:  Please see the history of present illness.   All other systems are reviewed and otherwise negative.   PHYSICAL EXAM:  VS:  There were no vitals taken for this visit. BMI: There is no height or weight on file to calculate BMI. Well nourished, well developed, in no acute distress  HEENT: normocephalic, atraumatic  Neck: no JVD, carotid bruits or masses Cardiac:  *** RRR, extrasytoles,  no significant murmurs, no rubs, or gallops Lungs:  *** CTA b/l, no wheezing, rhonchi or rales, lungs are clear Abd: soft, nontender MS: no deformity or atrophy Ext:  *** trace  edema chronic looking skin changes b/l LE, she has a CDI bandage L shin Skin: warm and dry, no rash Neuro:  No gross deficits appreciated Psych: euthymic mood, full affect  *** PPM site is stable, no tethering or discomfort   EKG:  not done today  PPM interroation done today and reviewed by myself:  ***   02/12/22: TTE  1. Left ventricular ejection fraction, by estimation, is 60 to 65%. The  left ventricle has normal function. The left ventricle has no regional  wall motion abnormalities. Left ventricular diastolic parameters are  indeterminate.   2. IVC poorly visualized, cannot estimate RA pressure. Based on TR  velocity alone there is severe pulmonary HTN, PASP is at least 75 mmHg. .  Right ventricular systolic function was not well visualized. The right  ventricular size is not well visualized.   3. The mitral valve was not well visualized. No evidence of mitral valve  regurgitation. No evidence of mitral stenosis.   4. Tricuspid valve regurgitation is mild to moderate.   5. The aortic valve  is tricuspid. There is moderate calcification of the  aortic valve. There is moderate thickening of the aortic valve. Aortic  valve regurgitation is not visualized. No aortic stenosis is present.    08/05/2021: TTE IMPRESSIONS   1. Left ventricular ejection fraction, by estimation, is 60 to 65%. The  left ventricle has normal function. The left ventricle has no regional  wall motion abnormalities. There is mild left ventricular hypertrophy.  Left ventricular diastolic parameters  are indeterminate.   2. Right ventricular systolic function is normal. The right ventricular  size is normal. There is normal pulmonary artery systolic pressure.   3. The mitral valve is normal in structure. No evidence of mitral valve  regurgitation. No evidence of mitral stenosis.   4. The aortic valve is tricuspid. Aortic valve regurgitation is not  visualized. Mild to moderate aortic valve sclerosis/calcification is  present, without any evidence of aortic stenosis.   5. The inferior vena cava is normal in size with greater than 50%  respiratory variability, suggesting right atrial pressure of 3 mmHg.   Comparison(s): No significant change from prior study. Prior images  reviewed side by side.    CT CORONARY MORPH W/CTA COR W/SCORE W/CA W/CM &/OR WO/CM Addendum Date: 03/10/2020   IMPRESSION:  1. 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 CT FFR will be performed and reported separately. 2. The patient's coronary artery calcium score is 1371, which places the patient in the 99th percentile. Motion artifact from arrhythmia impacts the quantitation of coronary calcium which maybe overestimated. 3. Normal coronary origin with right dominance. 4. Poor contrast opacification of the tip of the left atrial appendage. Cannot exclude LA appendage thrombus vs incomplete contrast filling  Echocardiogram 03/08/2020 IMPRESSIONS   1. Left ventricular ejection  fraction, by estimation, is 60 to 65%. Left  ventricular ejection fraction by PLAX is 63 %. The left ventricle has  normal function. The left ventricle has no regional wall motion  abnormalities. Left ventricular diastolic  parameters are indeterminate.   2. Right ventricular systolic function is normal. The right ventricular  size is normal. There is moderately elevated pulmonary artery systolic  pressure.   3. Left atrial size was mildly dilated.   4. The mitral valve is normal in structure. Mild mitral valve  regurgitation. No evidence of mitral stenosis.   5. Tricuspid valve regurgitation is mild to moderate.   6. The aortic valve is tricuspid. Aortic valve regurgitation is not  visualized. No aortic stenosis is present.   7. The inferior vena cava is dilated in size with >50% respiratory  variability, suggesting right atrial pressure of 8 mmHg.      10/14/18: TTE Study Conclusions - Procedure narrative: Transthoracic echocardiography. Technically   difficult study. Intravenous contrast (Definity) was   administered. - Left ventricle: The cavity size was normal. There was moderate   concentric hypertrophy. Systolic function was normal. The   estimated ejection fraction was in the range of 60% to 65%. Wall   motion was normal; there were no regional wall motion   abnormalities. The study is not technically sufficient to allow   evaluation of LV diastolic function. - Left atrium: The atrium was normal in size. - Right atrium: The atrium was normal in size. Impressions: - Technically difficult study. Afib is noted. LVEF 60-65%, moderate   LVH, normal wall motion, normal biatrial size.   Recent Labs: 02/11/2022: ALT 10; Magnesium 1.7 05/29/2022: B Natriuretic Peptide 190.5; BUN 10; Creatinine, Ser 1.07; Hemoglobin 10.9; Platelets 203; Potassium 3.6; Sodium 142; TSH 2.658  08/04/2021: Cholesterol 102; HDL 44; LDL Cholesterol 50; Total CHOL/HDL Ratio 2.3; Triglycerides 39; VLDL 8    CrCl cannot be calculated (Unknown ideal weight.).   Wt Readings from Last 3 Encounters:  03/02/22 243 lb (110.2 kg)  02/15/22 243 lb 13.3 oz (110.6 kg)  01/20/22 250 lb (113.4 kg)     Other studies reviewed: Additional studies/records reviewed today include: summarized above  ASSESSMENT AND PLAN:  1. Paroxysmal AFib     CHA2DS2asc is 10, on Eliquis, appropriately dosed     ***     2. PPM *** Intact function No programming changes made *** Pacer is functioning well, some beat-beat slowing 2/2 PACs   3. CAD     known branch vessel disease     Cards recs in the hospital were to start amlodipine if more CP, will keep it on, given no ongoing CP     *** F/u with gen cards  4. HTN     A little low today     Stop amlodipine, may help with some of her edema  5. HFpEF ? p.HTN, pulm suspected 2/2 lack of compliance twith her BIPAP and diuretics *** Will have her follow up with gen cardiology team to stay on top of this with her   Disposition: ***     Current medicines are reviewed at length with the patient today.  The patient did not have any concerns regarding medicines  Signed, Tommye Standard, PA-C 06/04/2022 4:21 PM     [place] [address] [phone] (office)  [phone] (fax)

## 2022-06-06 ENCOUNTER — Encounter: Payer: Medicare PPO | Admitting: Physician Assistant

## 2022-06-06 ENCOUNTER — Telehealth: Payer: Self-pay

## 2022-06-06 NOTE — Telephone Encounter (Signed)
Community message sent to [employer] for processing.   Will await response.  

## 2022-06-06 NOTE — Telephone Encounter (Signed)
Confirmation received from [employer].  

## 2022-06-07 ENCOUNTER — Encounter (HOSPITAL_BASED_OUTPATIENT_CLINIC_OR_DEPARTMENT_OTHER): Payer: Medicare PPO | Attending: General Surgery | Admitting: General Surgery

## 2022-06-07 DIAGNOSIS — I890 Lymphedema, not elsewhere classified: Secondary | ICD-10-CM | POA: Diagnosis not present

## 2022-06-07 DIAGNOSIS — E10622 Type 1 diabetes mellitus with other skin ulcer: Secondary | ICD-10-CM | POA: Insufficient documentation

## 2022-06-07 DIAGNOSIS — I5032 Chronic diastolic (congestive) heart failure: Secondary | ICD-10-CM | POA: Insufficient documentation

## 2022-06-07 DIAGNOSIS — I872 Venous insufficiency (chronic) (peripheral): Secondary | ICD-10-CM | POA: Diagnosis not present

## 2022-06-07 DIAGNOSIS — L97811 Non-pressure chronic ulcer of other part of right lower leg limited to breakdown of skin: Secondary | ICD-10-CM | POA: Insufficient documentation

## 2022-06-07 DIAGNOSIS — I110 Hypertensive heart disease with heart failure: Secondary | ICD-10-CM | POA: Insufficient documentation

## 2022-06-07 DIAGNOSIS — Z950 Presence of cardiac pacemaker: Secondary | ICD-10-CM | POA: Insufficient documentation

## 2022-06-07 DIAGNOSIS — Z85850 Personal history of malignant neoplasm of thyroid: Secondary | ICD-10-CM | POA: Insufficient documentation

## 2022-06-07 DIAGNOSIS — S80821A Blister (nonthermal), right lower leg, initial encounter: Secondary | ICD-10-CM | POA: Diagnosis not present

## 2022-06-07 DIAGNOSIS — E108 Type 1 diabetes mellitus with unspecified complications: Secondary | ICD-10-CM | POA: Diagnosis not present

## 2022-06-07 DIAGNOSIS — Z9981 Dependence on supplemental oxygen: Secondary | ICD-10-CM | POA: Insufficient documentation

## 2022-06-07 DIAGNOSIS — I480 Paroxysmal atrial fibrillation: Secondary | ICD-10-CM | POA: Diagnosis not present

## 2022-06-07 DIAGNOSIS — L97412 Non-pressure chronic ulcer of right heel and midfoot with fat layer exposed: Secondary | ICD-10-CM | POA: Diagnosis not present

## 2022-06-07 DIAGNOSIS — E10621 Type 1 diabetes mellitus with foot ulcer: Secondary | ICD-10-CM | POA: Diagnosis not present

## 2022-06-07 NOTE — Progress Notes (Signed)
[name], [name] ([id number]) Visit Report for 06/07/2022 Chief Complaint Document Details Patient Name: Date of Service: [place], [name] 06/07/2022 3:00 PM Medical Record Number: [id number] Patient Account Number: [id number] Date of Birth/Sex: Treating RN: 09-19-1951 (71 y.o. [name] Primary Care Provider: Stark Klein Other Clinician: Referring Provider: Treating Provider/Extender: Delorise Jackson in Treatment: 9 Information Obtained from: Patient Chief Complaint Patient presents for treatment of open ulcers due to venous insufficiency in the setting of poorly controlled DM1 and CHF Electronic Signature(s) Signed: 06/07/2022 3:51:54 PM By: Fredirick Maudlin MD FACS Entered By: Fredirick Maudlin on 06/07/2022 15:51:53 -------------------------------------------------------------------------------- Debridement Details Patient Name: Date of Service: [name], [name] 06/07/2022 3:00 PM Medical Record Number: [id number] Patient Account Number: [id number] Date of Birth/Sex: Treating RN: May 03, 1951 (71 y.o. [name] Primary Care Provider: Stark Klein Other Clinician: Referring Provider: Treating Provider/Extender: Delorise Jackson in Treatment: 9 Debridement Performed for Assessment: Wound #2 Right,Posterior Calcaneus Performed By: Physician Fredirick Maudlin, MD Debridement Type: Debridement Severity of Tissue Pre Debridement: Fat layer exposed Level of Consciousness (Pre-procedure): Awake and Alert Pre-procedure Verification/Time Out Yes - 15:32 Taken: Start Time: 15:32 Pain Control: Other : Benzocaine 20% T Area Debrided (L x W): otal 0.2 (cm) x 0.2 (cm) = 0.04 (cm) Tissue and other material debrided: Non-Viable, Eschar Level: Non-Viable Tissue Debridement Description: Selective/Open Wound Instrument: Curette Bleeding: Minimum Hemostasis Achieved: Pressure Procedural Pain: 0 Post  Procedural Pain: 0 Response to Treatment: Procedure was tolerated well Level of Consciousness (Post- Awake and Alert procedure): Post Debridement Measurements of Total Wound Length: (cm) 0.2 Width: (cm) 0.2 Depth: (cm) 0.1 Volume: (cm) 0.003 Character of Wound/Ulcer Post Debridement: Improved Severity of Tissue Post Debridement: Fat layer exposed Post Procedure Diagnosis Same as Pre-procedure Electronic Signature(s) Signed: 06/07/2022 3:56:32 PM By: Fredirick Maudlin MD FACS Signed: 06/07/2022 5:19:00 PM By: Adline Peals Entered By: Adline Peals on 06/07/2022 15:33:01 -------------------------------------------------------------------------------- HPI Details Patient Name: Date of Service: [name], [name] 06/07/2022 3:00 PM Medical Record Number: [id number] Patient Account Number: [id number] Date of Birth/Sex: Treating RN: 1951/07/01 (71 y.o. [name] Primary Care Provider: Stark Klein Other Clinician: Referring Provider: Treating Provider/Extender: Delorise Jackson in Treatment: 9 History of Present Illness HPI Description: ADMISSION 04/03/2022 This is a 71 year old woman with a past medical history notable for type 1 diabetes mellitus, poorly controlled with her most recent A1c being 8.3, congestive heart failure with recent admission for exacerbation secondary to noncompliance with diuretics, as well as history of pontine stroke and oxygen dependence. She has wounds on her bilateral lower extremities. She says they have been present for about 2 months and initially started as blisters. She says that she has compression stockings, but does not wear them because they are too difficult to get on. ABIs done in clinic were 1.4 and 1.5, suggesting some degree of noncompressibility. She also has a wound on her right heel that she says is secondary to it getting bumped frequently as she transfers. She came to clinic with just dry  dressings on her legs. She does have home health care, as well as her sister that have been assisting her with her dressings. She has multiple wounds. On the left medial calf, just above her ankle, there is a somewhat geographic wound with slough and some epithelialization. On the anterior tibial surface and lateral calf, the wounds are quite desiccated with yellow eschar present. On the right anterior tibial surface, the wounds  are gritty with thin slough and eschar. The posterior right ankle wound is just above the calcaneus overlying the Achilles and has thick yellow-gray slough and a bit of odor. No purulent drainage appreciated. 04/10/2022: The wounds on her left leg are closed. On the right, the anterior tibial surface wounds are epithelializing and much cleaner. There is just a layer of biofilm on the open areas. On the posterior right ankle wound, there is a decreased volume of slough and no odor. We have been using Hydrofera Blue to all sites under compression. 04/17/2022: The wounds on her left leg remained closed and she did bring her juxta lite stockings with her today. On the right, the more distal wound is closed and the proximal wound has epithelialized significantly since her last visit. At the posterior right ankle, she still has accumulated some slough and some periwound callus but there is no odor or significant drainage. 04/24/2022: The right anterior leg wounds are closed. She still has a wound on her posterior right ankle that looks about the same as last week. She did not bring her right sided juxta light with her today but is wearing the left sided stocking. 05/01/2022: She has a tiny abrasion on her left anterior tibia which we did not add to her wound count today. The wound on her posterior right ankle is a little bit smaller with a little accumulated slough. No drainage or concern for infection. 05/08/2022: She continues to forget to bring her juxta lite stocking with her so she  remains in a compression wrap despite healing of the right anterior leg wounds. The wound on her posterior right ankle/calcaneus is a bit smaller today with just a little bit of accumulated slough. She is wearing a juxta lite stocking on the left leg. 05/15/2022: She says that she has been wearing her juxta lite stockings at home, but today she is wearing regular compression stockings that she purchased from [employer]; the juxta lite stockings are in the laundry. The wound on her posterior right ankle/calcaneus continues to contract and has just a little bit of accumulated slough. Unfortunately, her edema control is suboptimal on the left and she has developed several small blisters with a larger 1 at the medial calf. 05/23/2022: The patient has been wearing her juxta lite stockings. Unfortunately, she has developed a large blister on her right lower leg just above the ankle. The skin overlying it is still intact. The blister that had opened on her left medial calf is smaller; and the other small blistered sites are closed. The posterior right ankle/calcaneus is about the same today. 06/07/2022: The only remaining open wound is on her posterior right calcaneus. The majority of this is covered with eschar. Electronic Signature(s) Signed: 06/07/2022 3:52:26 PM By: Fredirick Maudlin MD FACS Entered By: Fredirick Maudlin on 06/07/2022 15:52:26 -------------------------------------------------------------------------------- Physical Exam Details Patient Name: Date of Service: [name], [name] 06/07/2022 3:00 PM Medical Record Number: [id number] Patient Account Number: [id number] Date of Birth/Sex: Treating RN: 03-07-1951 (71 y.o. [name] Primary Care Provider: Stark Klein Other Clinician: Referring Provider: Treating Provider/Extender: [name] Weeks in Treatment: 9 Constitutional . Slightly bradycardic, asymptomatic.. . . No acute  distress.Marland Kitchen Respiratory Normal work of breathing on room air.. Notes 06/07/2022: The only remaining open wound is on her posterior right calcaneus. The majority of this is covered with eschar. Electronic Signature(s) Signed: 06/07/2022 3:54:27 PM By: Fredirick Maudlin MD FACS Entered By: Fredirick Maudlin on 06/07/2022 15:54:27 -------------------------------------------------------------------------------- Physician Orders  Details Patient Name: Date of Service: [place], [name] 06/07/2022 3:00 PM Medical Record Number: [id number] Patient Account Number: [id number] Date of Birth/Sex: Treating RN: [birth year] (71 y.o. [name] Primary Care Provider: Stark Klein Other Clinician: Referring Provider: Treating Provider/Extender: Delorise Jackson in Treatment: 9 Verbal / Phone Orders: No Diagnosis Coding ICD-10 Coding Code Description I89.0 Lymphedema, not elsewhere classified R15.40 Chronic diastolic (congestive) heart failure E10.59 Type 1 diabetes mellitus with other circulatory complications G86.76 Type 1 diabetes mellitus with diabetic polyneuropathy Z68.41 Body mass index [BMI]40.0-44.9, adult E10.622 Type 1 diabetes mellitus with other skin ulcer L97.811 Non-pressure chronic ulcer of other part of right lower leg limited to breakdown of skin Follow-up Appointments ppointment in 1 week. - Dr. Celine Ahr - Room 2 - 6/26 at 3:00 PM Return A Bathing/ Shower/ Hygiene May shower and wash wound with soap and water. - with dressing changes Edema Control - Lymphedema / SCD / Other Bilateral Lower Extremities Elevate legs to the level of the heart or above for 30 minutes daily and/or when sitting, a frequency of: - throughout the day Patient to wear own compression stockings every day. - juxtalites to both legs Exercise regularly Home Health No change in wound care orders this week; continue Home Health for wound care. May utilize formulary equivalent  dressing for wound treatment orders unless otherwise specified. Other Home Health Orders/Instructions: - [facility] Wound Treatment Wound #2 - Calcaneus Wound Laterality: Right, Posterior Cleanser: Soap and Water Every Other Day/30 Days Discharge Instructions: May shower and wash wound with dial antibacterial soap and water prior to dressing change. Peri-Wound Care: Sween Lotion (Moisturizing lotion) Every Other Day/30 Days Discharge Instructions: Apply moisturizing lotion as directed Prim Dressing: KerraCel Ag Gelling Fiber Dressing, 4x5 in (silver alginate) Every Other Day/30 Days ary Discharge Instructions: Apply silver alginate to wound bed as instructed Secondary Dressing: ALLEVYN Heel 4 1/2in x 5 1/2in / 10.5cm x 13.5cm (Generic) Every Other Day/30 Days Discharge Instructions: Apply over primary dressing as directed. Secondary Dressing: Woven Gauze Sponge, Non-Sterile 4x4 in (Generic) Every Other Day/30 Days Discharge Instructions: Apply over primary dressing as directed. Secured With: [employer], 4.5x3.1 (in/yd) (Generic) Every Other Day/30 Days Discharge Instructions: Secure with Kerlix as directed. Secured With: 60M Medipore Public affairs consultant Surgical T 2x10 (in/yd) (Generic) Every Other Day/30 Days ape Discharge Instructions: Secure with tape as directed. Electronic Signature(s) Signed: 06/07/2022 3:56:32 PM By: Fredirick Maudlin MD FACS Entered By: Fredirick Maudlin on 06/07/2022 15:54:41 -------------------------------------------------------------------------------- Problem List Details Patient Name: Date of Service: [name], [name] 06/07/2022 3:00 PM Medical Record Number: [id number] Patient Account Number: [id number] Date of Birth/Sex: Treating RN: September 17, 1951 (71 y.o. [name] Primary Care Provider: Stark Klein Other Clinician: Referring Provider: Treating Provider/Extender: [name] Weeks in Treatment: 9 Active  Problems ICD-10 Encounter Code Description Active Date MDM Diagnosis I89.0 Lymphedema, not elsewhere classified 04/03/2022 No Yes T24.58 Chronic diastolic (congestive) heart failure 04/03/2022 No Yes E10.59 Type 1 diabetes mellitus with other circulatory complications 0/99/8338 No Yes E10.42 Type 1 diabetes mellitus with diabetic polyneuropathy 04/03/2022 No Yes Z68.41 Body mass index [BMI]40.0-44.9, adult 04/03/2022 No Yes E10.622 Type 1 diabetes mellitus with other skin ulcer 04/03/2022 No Yes L97.811 Non-pressure chronic ulcer of other part of right lower leg limited to breakdown 05/23/2022 No Yes of skin Inactive Problems ICD-10 Code Description Active Date Inactive Date L97.212 Non-pressure chronic ulcer of right calf with fat layer exposed 04/03/2022 04/03/2022 L97.222 Non-pressure chronic ulcer of  left calf with fat layer exposed 04/03/2022 04/03/2022 L97.312 Non-pressure chronic ulcer of right ankle with fat layer exposed 04/03/2022 04/03/2022 L97.821 Non-pressure chronic ulcer of other part of left lower leg limited to breakdown of skin 05/15/2022 05/15/2022 Resolved Problems Electronic Signature(s) Signed: 06/07/2022 3:51:38 PM By: Fredirick Maudlin MD FACS Entered By: Fredirick Maudlin on 06/07/2022 15:51:38 -------------------------------------------------------------------------------- Progress Note Details Patient Name: Date of Service: [name], [name] 06/07/2022 3:00 PM Medical Record Number: [id number] Patient Account Number: [id number] Date of Birth/Sex: Treating RN: 1951/07/16 (71 y.o. [name] Primary Care Provider: Stark Klein Other Clinician: Referring Provider: Treating Provider/Extender: Delorise Jackson in Treatment: 9 Subjective Chief Complaint Information obtained from Patient Patient presents for treatment of open ulcers due to venous insufficiency in the setting of poorly controlled DM1 and CHF History of Present Illness  (HPI) ADMISSION 04/03/2022 This is a 71 year old woman with a past medical history notable for type 1 diabetes mellitus, poorly controlled with her most recent A1c being 8.3, congestive heart failure with recent admission for exacerbation secondary to noncompliance with diuretics, as well as history of pontine stroke and oxygen dependence. She has wounds on her bilateral lower extremities. She says they have been present for about 2 months and initially started as blisters. She says that she has compression stockings, but does not wear them because they are too difficult to get on. ABIs done in clinic were 1.4 and 1.5, suggesting some degree of noncompressibility. She also has a wound on her right heel that she says is secondary to it getting bumped frequently as she transfers. She came to clinic with just dry dressings on her legs. She does have home health care, as well as her sister that have been assisting her with her dressings. She has multiple wounds. On the left medial calf, just above her ankle, there is a somewhat geographic wound with slough and some epithelialization. On the anterior tibial surface and lateral calf, the wounds are quite desiccated with yellow eschar present. On the right anterior tibial surface, the wounds are gritty with thin slough and eschar. The posterior right ankle wound is just above the calcaneus overlying the Achilles and has thick yellow-gray slough and a bit of odor. No purulent drainage appreciated. 04/10/2022: The wounds on her left leg are closed. On the right, the anterior tibial surface wounds are epithelializing and much cleaner. There is just a layer of biofilm on the open areas. On the posterior right ankle wound, there is a decreased volume of slough and no odor. We have been using Hydrofera Blue to all sites under compression. 04/17/2022: The wounds on her left leg remained closed and she did bring her juxta lite stockings with her today. On the right,  the more distal wound is closed and the proximal wound has epithelialized significantly since her last visit. At the posterior right ankle, she still has accumulated some slough and some periwound callus but there is no odor or significant drainage. 04/24/2022: The right anterior leg wounds are closed. She still has a wound on her posterior right ankle that looks about the same as last week. She did not bring her right sided juxta light with her today but is wearing the left sided stocking. 05/01/2022: She has a tiny abrasion on her left anterior tibia which we did not add to her wound count today. The wound on her posterior right ankle is a little bit smaller with a little accumulated slough. No drainage or concern for  infection. 05/08/2022: She continues to forget to bring her juxta lite stocking with her so she remains in a compression wrap despite healing of the right anterior leg wounds. The wound on her posterior right ankle/calcaneus is a bit smaller today with just a little bit of accumulated slough. She is wearing a juxta lite stocking on the left leg. 05/15/2022: She says that she has been wearing her juxta lite stockings at home, but today she is wearing regular compression stockings that she purchased from [employer]; the juxta lite stockings are in the laundry. The wound on her posterior right ankle/calcaneus continues to contract and has just a little bit of accumulated slough. Unfortunately, her edema control is suboptimal on the left and she has developed several small blisters with a larger 1 at the medial calf. 05/23/2022: The patient has been wearing her juxta lite stockings. Unfortunately, she has developed a large blister on her right lower leg just above the ankle. The skin overlying it is still intact. The blister that had opened on her left medial calf is smaller; and the other small blistered sites are closed. The posterior right ankle/calcaneus is about the same today. 06/07/2022: The  only remaining open wound is on her posterior right calcaneus. The majority of this is covered with eschar. Patient History Information obtained from Patient. Family History Heart Disease - Mother, Hypertension - Mother,Father, Stroke - Child. Social History Never smoker, Marital Status - Divorced, Alcohol Use - Never, Drug Use - No History, Caffeine Use - Never. Medical History Eyes Patient has history of Cataracts Hematologic/Lymphatic Patient has history of Anemia Respiratory Patient has history of Sleep Apnea Cardiovascular Patient has history of Congestive Heart Failure, Hypertension Endocrine Patient has history of Type I Diabetes Oncologic Patient has history of Received Radiation Hospitalization/Surgery History - pacemaker implant. - loop recorder insertion and removal. - esophagogastroduodenoscopy. - TEE without cardioversion. - colonoscopy. - left and right heart catheterization. - abdominal angiogram. - thyroidectomy. - abdominal hysterectomy. - ankle fracture surgery. Medical A Surgical History Notes nd Ear/Nose/Mouth/Throat edentulous, GERD, seasonal allergies, trouble swallowing Hematologic/Lymphatic hyperlipidemia Cardiovascular complete heart block, bradycardia, paroxysmal atrial fibrillation, pacemaker Gastrointestinal hemorrhoids Endocrine hypothyroidism Genitourinary proteinuria, frequent UTIs Musculoskeletal decreased ROM L shoulder, hemiparesis affecting L side, arthritis Neurologic benign paroxysmal positional vertigo, intracranial vascular stenosis, lacunar infarction, stroke Oncologic thyroid cancer Psychiatric depression Objective Constitutional Slightly bradycardic, asymptomatic.Marland Kitchen No acute distress.. Vitals Time Taken: 3:15 PM, Height: 67 in, Weight: 243 lbs, BMI: 38.1, Temperature: 97.9 F, Pulse: 58 bpm, Respiratory Rate: 20 breaths/min, Blood Pressure: 134/69 mmHg, Capillary Blood Glucose: 199 mg/dl. Respiratory Normal work of breathing  on room air.. General Notes: 06/07/2022: The only remaining open wound is on her posterior right calcaneus. The majority of this is covered with eschar. Integumentary (Hair, Skin) Wound #10 status is Open. Original cause of wound was Blister. The date acquired was: 05/23/2022. The wound has been in treatment 2 weeks. The wound is located on the Right,Medial Lower Leg. The wound measures 0cm length x 0cm width x 0cm depth; 0cm^2 area and 0cm^3 volume. There is no tunneling or undermining noted. There is a none present amount of drainage noted. The wound margin is flat and intact. There is no granulation within the wound bed. There is no necrotic tissue within the wound bed. Wound #2 status is Open. Original cause of wound was Gradually Appeared. The date acquired was: 01/25/2022. The wound has been in treatment 9 weeks. The wound is located on the Right,Posterior Calcaneus. The wound measures 0.2cm  length x 0.2cm width x 0.1cm depth; 0.031cm^2 area and 0.003cm^3 volume. There is Fat Layer (Subcutaneous Tissue) exposed. There is no tunneling or undermining noted. There is a none present amount of drainage noted. The wound margin is distinct with the outline attached to the wound base. There is small (1-33%) red, pink granulation within the wound bed. There is a large (67-100%) amount of necrotic tissue within the wound bed including Eschar. Wound #9 status is Open. Original cause of wound was Blister. The date acquired was: 05/23/2022. The wound has been in treatment 2 weeks. The wound is located on the Right,Anterior Lower Leg. The wound measures 0cm length x 0cm width x 0cm depth; 0cm^2 area and 0cm^3 volume. There is no tunneling or undermining noted. There is a none present amount of drainage noted. The wound margin is flat and intact. There is no granulation within the wound bed. There is no necrotic tissue within the wound bed. Assessment Active Problems ICD-10 Lymphedema, not elsewhere  classified Chronic diastolic (congestive) heart failure Type 1 diabetes mellitus with other circulatory complications Type 1 diabetes mellitus with diabetic polyneuropathy Body mass index [BMI]40.0-44.9, adult Type 1 diabetes mellitus with other skin ulcer Non-pressure chronic ulcer of other part of right lower leg limited to breakdown of skin Procedures Wound #2 Pre-procedure diagnosis of Wound #2 is a Diabetic Wound/Ulcer of the Lower Extremity located on the Right,Posterior Calcaneus .Severity of Tissue Pre Debridement is: Fat layer exposed. There was a Selective/Open Wound Non-Viable Tissue Debridement with a total area of 0.04 sq cm performed by Fredirick Maudlin, MD. With the following instrument(s): Curette to remove Non-Viable tissue/material. Material removed includes Eschar after achieving pain control using Other (Benzocaine 20%). No specimens were taken. A time out was conducted at 15:32, prior to the start of the procedure. A Minimum amount of bleeding was controlled with Pressure. The procedure was tolerated well with a pain level of 0 throughout and a pain level of 0 following the procedure. Post Debridement Measurements: 0.2cm length x 0.2cm width x 0.1cm depth; 0.003cm^3 volume. Character of Wound/Ulcer Post Debridement is improved. Severity of Tissue Post Debridement is: Fat layer exposed. Post procedure Diagnosis Wound #2: Same as Pre-Procedure Plan Follow-up Appointments: Return Appointment in 1 week. - Dr. Celine Ahr - Room 2 - 6/26 at 3:00 PM Bathing/ Shower/ Hygiene: May shower and wash wound with soap and water. - with dressing changes Edema Control - Lymphedema / SCD / Other: Elevate legs to the level of the heart or above for 30 minutes daily and/or when sitting, a frequency of: - throughout the day Patient to wear own compression stockings every day. - juxtalites to both legs Exercise regularly Home Health: No change in wound care orders this week; continue Home  Health for wound care. May utilize formulary equivalent dressing for wound treatment orders unless otherwise specified. Other Home Health Orders/Instructions: [facility] [place] WOUND #2: - Calcaneus Wound Laterality: Right, Posterior Cleanser: Soap and Water Every Other Day/30 Days Discharge Instructions: May shower and wash wound with dial antibacterial soap and water prior to dressing change. Peri-Wound Care: Sween Lotion (Moisturizing lotion) Every Other Day/30 Days Discharge Instructions: Apply moisturizing lotion as directed Prim Dressing: KerraCel Ag Gelling Fiber Dressing, 4x5 in (silver alginate) Every Other Day/30 Days ary Discharge Instructions: Apply silver alginate to wound bed as instructed Secondary Dressing: ALLEVYN Heel 4 1/2in x 5 1/2in / 10.5cm x 13.5cm (Generic) Every Other Day/30 Days Discharge Instructions: Apply over primary dressing as directed. Secondary Dressing: Woven Gauze  Sponge, Non-Sterile 4x4 in (Generic) Every Other Day/30 Days Discharge Instructions: Apply over primary dressing as directed. Secured With: [employer], 4.5x3.1 (in/yd) (Generic) Every Other Day/30 Days Discharge Instructions: Secure with Kerlix as directed. Secured With: 41M Medipore Public affairs consultant Surgical T 2x10 (in/yd) (Generic) Every Other Day/30 Days ape Discharge Instructions: Secure with tape as directed. 06/07/2022: The only remaining open wound is on her posterior right calcaneus. The majority of this is covered with eschar. I used a curette to debride the eschar from this wound. The majority of it is closed with just a small open area. We will continue using silver alginate to the site. Follow-up in 1 week. Electronic Signature(s) Signed: 06/07/2022 3:55:06 PM By: Fredirick Maudlin MD FACS Entered By: Fredirick Maudlin on 06/07/2022 15:55:06 -------------------------------------------------------------------------------- HxROS Details Patient Name: Date of Service: [name], [name]  06/07/2022 3:00 PM Medical Record Number: [id number] Patient Account Number: [id number] Date of Birth/Sex: Treating RN: [birth year] (71 y.o. [name] Primary Care Provider: Stark Klein Other Clinician: Referring Provider: Treating Provider/Extender: Delorise Jackson in Treatment: 9 Information Obtained From Patient Eyes Medical History: Positive for: Cataracts Ear/Nose/Mouth/Throat Medical History: Past Medical History Notes: edentulous, GERD, seasonal allergies, trouble swallowing Hematologic/Lymphatic Medical History: Positive for: Anemia Past Medical History Notes: hyperlipidemia Respiratory Medical History: Positive for: Sleep Apnea Cardiovascular Medical History: Positive for: Congestive Heart Failure; Hypertension Past Medical History Notes: complete heart block, bradycardia, paroxysmal atrial fibrillation, pacemaker Gastrointestinal Medical History: Past Medical History Notes: hemorrhoids Endocrine Medical History: Positive for: Type I Diabetes Past Medical History Notes: hypothyroidism Time with diabetes: 76 yrs Treated with: Insulin Blood sugar tested every day: Yes Tested : on pump Genitourinary Medical History: Past Medical History Notes: proteinuria, frequent UTIs Musculoskeletal Medical History: Past Medical History Notes: decreased ROM L shoulder, hemiparesis affecting L side, arthritis Neurologic Medical History: Past Medical History Notes: benign paroxysmal positional vertigo, intracranial vascular stenosis, lacunar infarction, stroke Oncologic Medical History: Positive for: Received Radiation Past Medical History Notes: thyroid cancer Psychiatric Medical History: Past Medical History Notes: depression HBO Extended History Items Eyes: Cataracts Immunizations Pneumococcal Vaccine: Received Pneumococcal Vaccination: Yes Received Pneumococcal Vaccination On or After 60th Birthday: Yes Implantable  Devices Yes Hospitalization / Surgery History Type of Hospitalization/Surgery pacemaker implant loop recorder insertion and removal esophagogastroduodenoscopy TEE without cardioversion colonoscopy left and right heart catheterization abdominal angiogram thyroidectomy abdominal hysterectomy ankle fracture surgery Family and Social History Heart Disease: Yes - Mother; Hypertension: Yes - Mother,Father; Stroke: Yes - Child; Never smoker; Marital Status - Divorced; Alcohol Use: Never; Drug Use: No History; Caffeine Use: Never; Financial Concerns: No; Food, Clothing or Shelter Needs: No; Support System Lacking: No; Transportation Concerns: No Electronic Signature(s) Signed: 06/07/2022 3:56:32 PM By: Fredirick Maudlin MD FACS Signed: 06/07/2022 5:19:00 PM By: Adline Peals Entered By: Fredirick Maudlin on 06/07/2022 15:53:30 -------------------------------------------------------------------------------- SuperBill Details Patient Name: Date of Service: [name], [name] 06/07/2022 Medical Record Number: [id number] Patient Account Number: [id number] Date of Birth/Sex: Treating RN: [birth year] (71 y.o. 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 Z68.41 Body mass index [BMI]40.0-44.9, adult E10.622 Type 1 diabetes mellitus with other skin ulcer L97.811 Non-pressure chronic ulcer of other part of right lower leg limited to breakdown  of skin Facility Procedures CPT4 Code: [id number] Description: [phone] - DEBRIDE WOUND 1ST 20 SQ CM OR < ICD-10 Diagnosis Description L97.811  Non-pressure chronic ulcer of other part of right lower leg limited to breakdown Modifier: of skin Quantity: 1 Physician Procedures : CPT4 Code Description Modifier [id number] - WC PHYS LEVEL 3 - EST PT ICD-10 Diagnosis Description L97.811 Non-pressure chronic ulcer of other part of right lower leg limited to breakdown of skin I89.0 Lymphedema, not elsewhere classified E10.622  Type 1 diabetes mellitus with other skin ulcer P16.74 Chronic diastolic (congestive) heart failure Quantity: 1 : [id number] - WC PHYS DEBR WO ANESTH 20 SQ CM ICD-10 Diagnosis Description L97.811 Non-pressure chronic ulcer of other part of right lower leg limited to breakdown of skin Quantity: 1 Electronic Signature(s) Signed: 06/07/2022 3:55:42 PM By: Fredirick Maudlin MD FACS Entered By: Fredirick Maudlin on 06/07/2022 15:55:41

## 2022-06-07 NOTE — Progress Notes (Signed)
[name], [name] Estrella [name] ([id number]) Visit Report for 06/07/2022 Arrival Information Details Patient Name: Date of Service: [name], [name]. 06/07/2022 3:00 PM Medical Record Number: [id number] Patient Account Number: [id number] Date of Birth/Sex: Treating RN: [birth year] (71 y.o. [name] Primary Care Chiyoko Torrico: Stark Klein Other Clinician: Referring Jalana Moore: Treating Cobie Leidner/Extender: Delorise Jackson in Treatment: 9 Visit Information History Since Last Visit Added or deleted any medications: No Patient Arrived: Wheel Chair Any new allergies or adverse reactions: No Arrival Time: 15:06 Had a fall or experienced change in No Accompanied By: self activities of daily living that may affect Transfer Assistance: Manual risk of falls: Patient Identification Verified: Yes Signs or symptoms of abuse/neglect since last visito No Secondary Verification Process Completed: Yes Hospitalized since last visit: No Patient Requires Transmission-Based Precautions: No Implantable device outside of the clinic excluding No Patient Has Alerts: Yes cellular tissue based products placed in the center Patient Alerts: Patient on Blood Thinner since last visit: pacemaker Has Dressing in Place as Prescribed: Yes Pain Present Now: No Electronic Signature(s) Signed: 06/07/2022 5:19:00 PM By: Adline Peals Entered By: Adline Peals on 06/07/2022 15:14:23 -------------------------------------------------------------------------------- Encounter Discharge Information Details Patient Name: Date of Service: [name], [name] 06/07/2022 3:00 PM Medical Record Number: [id number] Patient Account Number: [id number] Date of Birth/Sex: Treating RN: 1951/07/29 (71 y.o. [name] Primary Care Izekiel Flegel: Stark Klein Other Clinician: Referring Veora Fonte: Treating Duan Scharnhorst/Extender: Delorise Jackson in Treatment: 9 Encounter  Discharge Information Items Post Procedure Vitals Discharge Condition: Stable Temperature (F): 97.9 Ambulatory Status: Wheelchair Pulse (bpm): 58 Discharge Destination: Home Respiratory Rate (breaths/min): 20 Transportation: Private Auto Blood Pressure (mmHg): 134/69 Accompanied By: self Schedule Follow-up Appointment: Yes Clinical Summary of Care: Patient Declined Electronic Signature(s) Signed: 06/07/2022 5:19:00 PM By: Adline Peals Entered By: Adline Peals on 06/07/2022 16:47:51 -------------------------------------------------------------------------------- Lower Extremity Assessment Details Patient Name: Date of Service: [name], [name] 06/07/2022 3:00 PM Medical Record Number: [id number] Patient Account Number: [id number] Date of Birth/Sex: Treating RN: 07/01/1951 (71 y.o. [name] Primary Care Tynika Luddy: Stark Klein Other Clinician: Referring Mansa Willers: Treating Lochlin Eppinger/Extender: [name] Weeks in Treatment: 9 Edema Assessment Assessed: [Left: No] [Right: No] E[Left: dema] [Right: :] Calf Left: Right: Point of Measurement: From Medial Instep 38.4 cm 35.2 cm Ankle Left: Right: Point of Measurement: From Medial Instep 23.7 cm 22.3 cm Vascular Assessment Pulses: Dorsalis Pedis Palpable: [Left:Yes] [Right:Yes] Electronic Signature(s) Signed: 06/07/2022 5:19:00 PM By: Adline Peals Entered By: Adline Peals on 06/07/2022 15:21:40 -------------------------------------------------------------------------------- Multi Wound Chart Details Patient Name: Date of Service: [name], [name] 06/07/2022 3:00 PM Medical Record Number: [id number] Patient Account Number: [id number] Date of Birth/Sex: Treating RN: 19-Jan-1951 (71 y.o. [name] Primary Care Marsella Suman: Stark Klein Other Clinician: Referring Trumaine Wimer: Treating Taygen Newsome/Extender: [name] Weeks in  Treatment: 9 Vital Signs Height(in): 57 Capillary Blood Glucose(mg/dl): 199 Weight(lbs): 243 Pulse(bpm): 79 Body Mass Index(BMI): 38.1 Blood Pressure(mmHg): 134/69 Temperature(F): 97.9 Respiratory Rate(breaths/min): 20 Photos: [10:Right, Medial Lower Leg] [2:Right, Posterior Calcaneus] [9:Right, Anterior Lower Leg] Wound Location: [10:Blister] [2:Gradually Appeared] [9:Blister] Wounding Event: [10:Diabetic Wound/Ulcer of the Lower] [2:Diabetic Wound/Ulcer of the Lower] [9:Diabetic Wound/Ulcer of the Lower] Primary Etiology: [10:Extremity Cataracts, Anemia, Sleep Apnea,] [2:Extremity Cataracts, Anemia, Sleep Apnea,] [9:Extremity Cataracts, Anemia, Sleep Apnea,] Comorbid History: [10:Congestive Heart Failure, Hypertension, Type I Diabetes, Received Radiation 05/23/2022] [2:Congestive Heart Failure, Hypertension, Type I Diabetes, Received Radiation 01/25/2022] [9:Congestive Heart Failure, Hypertension, Type I  Diabetes, Received Radiation 05/23/2022]  Date Acquired: [10:2] [2:9] [9:2] Weeks of Treatment: [10:Open] [2:Open] [9:Open] Wound Status: [10:No] [2:No] [9:No] Wound Recurrence: [10:0x0x0] [2:0.2x0.2x0.1] [9:0x0x0] Measurements L x W x D (cm) [10:0] [2:0.031] [9:0] A (cm) : rea [10:0] [2:0.003] [9:0] Volume (cm) : [10:100.00%] [2:97.80%] [9:100.00%] % Reduction in A [10:rea: 100.00%] [2:98.90%] [9:100.00%] % Reduction in Volume: [10:Grade 2] [2:Grade 2] [9:Grade 2] Classification: [10:None Present] [2:None Present] [9:None Present] Exudate A mount: [10:Flat and Intact] [2:Distinct, outline attached] [9:Flat and Intact] Wound Margin: [10:None Present (0%)] [2:Small (1-33%)] [9:None Present (0%)] Granulation A mount: [10:N/A] [2:Red, Pink] [9:N/A] Granulation Quality: [10:None Present (0%)] [2:Large (67-100%)] [9:None Present (0%)] Necrotic A mount: [10:N/A] [2:Eschar] [9:N/A] Necrotic Tissue: [10:Fascia: No] [2:Fat Layer (Subcutaneous Tissue): Yes Fascia: No] Exposed  Structures: [10:Fat Layer (Subcutaneous Tissue): No Tendon: No Muscle: No Joint: No Bone: No Large (67-100%)] [2:Fascia: No Tendon: No Muscle: No Joint: No Bone: No Large (67-100%)] [9:Fat Layer (Subcutaneous Tissue): No Tendon: No Muscle: No Joint: No Bone: No  Large (67-100%)] Epithelialization: [10:N/A] [2:Debridement - Selective/Open Wound N/A] Debridement: Pre-procedure Verification/Time Out N/A [2:15:32] [9:N/A] Taken: [10:N/A] [2:Other] [9:N/A] Pain Control: [10:N/A] [2:Necrotic/Eschar] [9:N/A] Tissue Debrided: [10:N/A] [2:Non-Viable Tissue] [9:N/A] Level: [10:N/A] [2:0.04] [9:N/A] Debridement A (sq cm): [10:rea N/A] [2:Curette] [9:N/A] Instrument: [10:N/A] [2:Minimum] [9:N/A] Bleeding: [10:N/A] [2:Pressure] [9:N/A] Hemostasis A chieved: [10:N/A] [2:0] [9:N/A] Procedural Pain: [10:N/A] [2:0] [9:N/A] Post Procedural Pain: [10:N/A] [2:Procedure was tolerated well] [9:N/A] Debridement Treatment Response: [10:N/A] [2:0.2x0.2x0.1] [9:N/A] Post Debridement Measurements L x W x D (cm) [10:N/A] [2:0.003] [9:N/A] Post Debridement Volume: (cm) [10:N/A] [2:Debridement] [9:N/A] Treatment Notes Electronic Signature(s) Signed: 06/07/2022 3:51:48 PM By: Fredirick Maudlin MD FACS Signed: 06/07/2022 5:19:00 PM By: Adline Peals Entered By: Fredirick Maudlin on 06/07/2022 15:51:48 -------------------------------------------------------------------------------- Multi-Disciplinary Care Plan Details Patient Name: Date of Service: [name], [name] [place] [name] 06/07/2022 3:00 PM Medical Record Number: [id number] Patient Account Number: [id number] Date of Birth/Sex: Treating RN: November 22, 1951 (71 y.o. [name] Primary Care Joanna Hall: Stark Klein Other Clinician: Referring Jessi Pitstick: Treating Josiyah Tozzi/Extender: Delorise Jackson in Treatment: 9 Multidisciplinary Care Plan reviewed with physician Active Inactive Abuse / Safety / Falls / Self Care Management Nursing  Diagnoses: History of Falls Impaired physical mobility Goals: Patient/caregiver will verbalize/demonstrate measures taken to prevent injury and/or falls Date Initiated: 04/03/2022 Target Resolution Date: 07/04/2022 Goal Status: Active Interventions: Assess fall risk on admission and as needed Assess: immobility, friction, shearing, incontinence upon admission and as needed Assess impairment of mobility on admission and as needed per policy Notes: Nutrition Nursing Diagnoses: Impaired glucose control: actual or potential Potential for alteratiion in Nutrition/Potential for imbalanced nutrition Goals: Patient/caregiver will maintain therapeutic glucose control Date Initiated: 04/03/2022 Target Resolution Date: 07/04/2022 Goal Status: Active Interventions: Assess HgA1c results as ordered upon admission and as needed Provide education on elevated blood sugars and impact on wound healing Treatment Activities: Patient referred to Primary Care Physician for further nutritional evaluation : 04/03/2022 Notes: Venous Leg Ulcer Nursing Diagnoses: Knowledge deficit related to disease process and management Potential for venous Insuffiency (use before diagnosis confirmed) Goals: Patient will maintain optimal edema control Date Initiated: 04/03/2022 Target Resolution Date: 07/04/2022 Goal Status: Active Interventions: Assess peripheral edema status every visit. Compression as ordered Provide education on venous insufficiency Treatment Activities: Therapeutic compression applied : 04/03/2022 Notes: Wound/Skin Impairment Nursing Diagnoses: Impaired tissue integrity Goals: Patient/caregiver will verbalize understanding of skin care regimen Date Initiated: 04/03/2022 Target Resolution Date: 07/04/2022 Goal Status: Active Ulcer/skin breakdown will have a volume reduction of 30% by week 4 Date Initiated: 04/03/2022 Target  Resolution Date: 07/04/2022 Goal Status: Active Interventions: Assess  ulceration(s) every visit Provide education on ulcer and skin care Treatment Activities: Skin care regimen initiated : 04/03/2022 Topical wound management initiated : 04/03/2022 Notes: Electronic Signature(s) Signed: 06/07/2022 5:19:00 PM By: Adline Peals Entered By: Adline Peals on 06/07/2022 15:22:48 -------------------------------------------------------------------------------- Pain Assessment Details Patient Name: Date of Service: [name], [name] 06/07/2022 3:00 PM Medical Record Number: [id number] Patient Account Number: [id number] Date of Birth/Sex: Treating RN: [birth year] (71 y.o. [name] Primary Care Opie Fanton: Stark Klein Other Clinician: Referring Sharlynn Seckinger: Treating Deng Kemler/Extender: Delorise Jackson in Treatment: 9 Active Problems Location of Pain Severity and Description of Pain Patient Has Paino No Site Locations Rate the pain. Current Pain Level: 0 Pain Management and Medication Current Pain Management: Electronic Signature(s) Signed: 06/07/2022 5:19:00 PM By: Adline Peals Entered By: Adline Peals on 06/07/2022 15:14:38 -------------------------------------------------------------------------------- Patient/Caregiver Education Details Patient Name: Date of Service: [name], [name] 6/14/2023andnbsp3:00 PM Medical Record Number: [id number] Patient Account Number: [id number] Date of Birth/Gender: Treating RN: 17-Sep-1951 (71 y.o. [name] Primary Care Physician: Stark Klein Other Clinician: Referring Physician: Treating Physician/Extender: Delorise Jackson in Treatment: 9 Education Assessment Education Provided To: Patient Education Topics Provided Wound/Skin Impairment: Methods: Explain/Verbal Responses: Reinforcements needed, State content correctly Electronic Signature(s) Signed: 06/07/2022 5:19:00 PM By: Adline Peals Entered By:  Adline Peals on 06/07/2022 15:23:01 -------------------------------------------------------------------------------- Wound Assessment Details Patient Name: Date of Service: [name], [name] 06/07/2022 3:00 PM Medical Record Number: [id number] Patient Account Number: [id number] Date of Birth/Sex: Treating RN: 27-Oct-1951 (71 y.o. [name] Primary Care Roshad Hack: Stark Klein Other Clinician: Referring Darlynn Ricco: Treating Mi Balla/Extender: [name] Weeks in Treatment: 9 Wound Status Wound Number: 10 Primary Diabetic Wound/Ulcer of the Lower Extremity Etiology: Wound Location: Right, Medial Lower Leg Wound Open Wounding Event: Blister Status: Date Acquired: 05/23/2022 Comorbid Cataracts, Anemia, Sleep Apnea, Congestive Heart Failure, Weeks Of Treatment: 2 History: Hypertension, Type I Diabetes, Received Radiation Clustered Wound: No Photos Wound Measurements Length: (cm) Width: (cm) Depth: (cm) Area: (cm) Volume: (cm) 0 % Reduction in Area: 100% 0 % Reduction in Volume: 100% 0 Epithelialization: Large (67-100%) 0 Tunneling: No 0 Undermining: No Wound Description Classification: Grade 2 Wound Margin: Flat and Intact Exudate Amount: None Present Foul Odor After Cleansing: No Slough/Fibrino No Wound Bed Granulation Amount: None Present (0%) Exposed Structure Necrotic Amount: None Present (0%) Fascia Exposed: No Fat Layer (Subcutaneous Tissue) Exposed: No Tendon Exposed: No Muscle Exposed: No Joint Exposed: No Bone Exposed: No Electronic Signature(s) Signed: 06/07/2022 5:19:00 PM By: Adline Peals Entered By: Adline Peals on 06/07/2022 15:24:15 -------------------------------------------------------------------------------- Wound Assessment Details Patient Name: Date of Service: [name], [name] 06/07/2022 3:00 PM Medical Record Number: [id number] Patient Account Number: [id number] Date of  Birth/Sex: Treating RN: 02-18-1951 (71 y.o. [name] Primary Care Fayola Meckes: Stark Klein Other Clinician: Referring Laval Cafaro: Treating Glena Pharris/Extender: [name] Weeks in Treatment: 9 Wound Status Wound Number: 2 Primary Diabetic Wound/Ulcer of the Lower Extremity Etiology: Wound Location: Right, Posterior Calcaneus Wound Open Wounding Event: Gradually Appeared Status: Date Acquired: 01/25/2022 Comorbid Cataracts, Anemia, Sleep Apnea, Congestive Heart Failure, Weeks Of Treatment: 9 History: Hypertension, Type I Diabetes, Received Radiation Clustered Wound: No Photos Wound Measurements Length: (cm) 0.2 Width: (cm) 0.2 Depth: (cm) 0.1 Area: (cm) 0.031 Volume: (cm) 0.003 % Reduction in Area: 97.8% % Reduction in Volume: 98.9% Epithelialization: Large (67-100%) Tunneling: No Undermining: No Wound Description Classification: Grade 2 Wound  Margin: Distinct, outline attached Exudate Amount: None Present Foul Odor After Cleansing: No Slough/Fibrino No Wound Bed Granulation Amount: Small (1-33%) Exposed Structure Granulation Quality: Red, Pink Fascia Exposed: No Necrotic Amount: Large (67-100%) Fat Layer (Subcutaneous Tissue) Exposed: Yes Necrotic Quality: Eschar Tendon Exposed: No Muscle Exposed: No Joint Exposed: No Bone Exposed: No Treatment Notes Wound #2 (Calcaneus) Wound Laterality: Right, Posterior Cleanser Soap and Water Discharge Instruction: May shower and wash wound with dial antibacterial soap and water prior to dressing change. Peri-Wound Care Sween Lotion (Moisturizing lotion) Discharge Instruction: Apply moisturizing lotion as directed Topical Primary Dressing KerraCel Ag Gelling Fiber Dressing, 4x5 in (silver alginate) Discharge Instruction: Apply silver alginate to wound bed as instructed Secondary Dressing ALLEVYN Heel 4 1/2in x 5 1/2in / 10.5cm x 13.5cm Discharge Instruction: Apply over primary dressing  as directed. Woven Gauze Sponge, Non-Sterile 4x4 in Discharge Instruction: Apply over primary dressing as directed. Secured With [employer], 4.5x3.1 (in/yd) Discharge Instruction: Secure with Kerlix as directed. 79M Medipore Soft Cloth Surgical T 2x10 (in/yd) ape Discharge Instruction: Secure with tape as directed. Compression Wrap Compression Stockings Add-Ons Electronic Signature(s) Signed: 06/07/2022 5:19:00 PM By: Adline Peals Entered By: Adline Peals on 06/07/2022 15:31:50 -------------------------------------------------------------------------------- Wound Assessment Details Patient Name: Date of Service: [name], [place] [name] 06/07/2022 3:00 PM Medical Record Number: [id number] Patient Account Number: [id number] Date of Birth/Sex: Treating RN: June 18, 1951 (71 y.o. [name] Primary Care Kelvon Giannini: Stark Klein Other Clinician: Referring Jahkai Yandell: Treating Marvel Mcphillips/Extender: [name] Weeks in Treatment: 9 Wound Status Wound Number: 9 Primary Diabetic Wound/Ulcer of the Lower Extremity Etiology: Wound Location: Right, Anterior Lower Leg Wound Open Wounding Event: Blister Status: Date Acquired: 05/23/2022 Comorbid Cataracts, Anemia, Sleep Apnea, Congestive Heart Failure, Weeks Of Treatment: 2 History: Hypertension, Type I Diabetes, Received Radiation Clustered Wound: No Photos Wound Measurements Length: (cm) Width: (cm) Depth: (cm) Area: (cm) Volume: (cm) 0 % Reduction in Area: 100% 0 % Reduction in Volume: 100% 0 Epithelialization: Large (67-100%) 0 Tunneling: No 0 Undermining: No Wound Description Classification: Grade 2 Wound Margin: Flat and Intact Exudate Amount: None Present Foul Odor After Cleansing: No Slough/Fibrino No Wound Bed Granulation Amount: None Present (0%) Exposed Structure Necrotic Amount: None Present (0%) Fascia Exposed: No Fat Layer (Subcutaneous Tissue) Exposed:  No Tendon Exposed: No Muscle Exposed: No Joint Exposed: No Bone Exposed: No Electronic Signature(s) Signed: 06/07/2022 5:19:00 PM By: Adline Peals Entered By: Adline Peals on 06/07/2022 15:24:33 -------------------------------------------------------------------------------- Vitals Details Patient Name: Date of Service: [name], [name] 06/07/2022 3:00 PM Medical Record Number: [id number] Patient Account Number: [id number] Date of Birth/Sex: Treating RN: [birth year] (71 y.o. [name] Primary Care Felicitas Sine: Stark Klein Other Clinician: Referring Eryk Beavers: Treating Zhi Geier/Extender: [name] Weeks in Treatment: 9 Vital Signs Time Taken: 15:15 Temperature (F): 97.9 Height (in): 67 Pulse (bpm): 58 Weight (lbs): 243 Respiratory Rate (breaths/min): 20 Body Mass Index (BMI): 38.1 Blood Pressure (mmHg): 134/69 Capillary Blood Glucose (mg/dl): 199 Reference Range: 80 - 120 mg / dl Electronic Signature(s) Signed: 06/07/2022 5:19:00 PM By: Adline Peals Entered By: Adline Peals on 06/07/2022 15:16:29

## 2022-06-13 DIAGNOSIS — E109 Type 1 diabetes mellitus without complications: Secondary | ICD-10-CM | POA: Diagnosis not present

## 2022-06-13 DIAGNOSIS — E11319 Type 2 diabetes mellitus with unspecified diabetic retinopathy without macular edema: Secondary | ICD-10-CM | POA: Diagnosis not present

## 2022-06-13 DIAGNOSIS — I10 Essential (primary) hypertension: Secondary | ICD-10-CM | POA: Diagnosis not present

## 2022-06-13 DIAGNOSIS — Z9641 Presence of insulin pump (external) (internal): Secondary | ICD-10-CM | POA: Diagnosis not present

## 2022-06-13 DIAGNOSIS — C73 Malignant neoplasm of thyroid gland: Secondary | ICD-10-CM | POA: Diagnosis not present

## 2022-06-13 DIAGNOSIS — Z794 Long term (current) use of insulin: Secondary | ICD-10-CM | POA: Diagnosis not present

## 2022-06-13 DIAGNOSIS — E7800 Pure hypercholesterolemia, unspecified: Secondary | ICD-10-CM | POA: Diagnosis not present

## 2022-06-13 DIAGNOSIS — I639 Cerebral infarction, unspecified: Secondary | ICD-10-CM | POA: Diagnosis not present

## 2022-06-13 DIAGNOSIS — E890 Postprocedural hypothyroidism: Secondary | ICD-10-CM | POA: Diagnosis not present

## 2022-06-19 ENCOUNTER — Ambulatory Visit (HOSPITAL_BASED_OUTPATIENT_CLINIC_OR_DEPARTMENT_OTHER): Payer: Medicare PPO | Admitting: General Surgery

## 2022-06-19 ENCOUNTER — Ambulatory Visit: Payer: Medicare PPO | Admitting: Family Medicine

## 2022-06-19 ENCOUNTER — Encounter: Payer: Self-pay | Admitting: Family Medicine

## 2022-06-19 VITALS — BP 140/39 | HR 52

## 2022-06-19 DIAGNOSIS — G4733 Obstructive sleep apnea (adult) (pediatric): Secondary | ICD-10-CM | POA: Diagnosis not present

## 2022-06-19 DIAGNOSIS — R0902 Hypoxemia: Secondary | ICD-10-CM | POA: Diagnosis not present

## 2022-06-19 DIAGNOSIS — I272 Pulmonary hypertension, unspecified: Secondary | ICD-10-CM | POA: Diagnosis not present

## 2022-06-19 NOTE — Progress Notes (Signed)
    SUBJECTIVE:   CHIEF COMPLAINT / HPI: f/u for increased oxygen requirement   She requires 3 liters to keep her oxygen saturations 91% -95% Otherwise drops to 81% on RA  She has never smoked but reports having second hand smoke   HM  She declines having mammogram  She reports that her last one was too painful  She reports that she does not usually see an eye doctor due to financial concerns and not having eye insurance   PERTINENT  PMH / PSH: Hypoxia requiring supplemental oxygen   OBJECTIVE:   BP (!) 140/39   Pulse (!) 52   SpO2 99%   Physical Exam Constitutional:      General: She is not in acute distress.    Appearance: Normal appearance. She is not ill-appearing or toxic-appearing.     Comments: In electric wheelchair   Cardiovascular:     Rate and Rhythm: Normal rate and regular rhythm.     Pulses: Normal pulses.     Heart sounds: Normal heart sounds.  Pulmonary:     Effort: Pulmonary effort is normal.     Breath sounds: No wheezing, rhonchi or rales.     Comments: Breathing comfortably on 3 liters of supplemental oxygen  Abdominal:     Palpations: Abdomen is soft.     Tenderness: There is no abdominal tenderness.  Neurological:     Mental Status: She is alert.      ASSESSMENT/PLAN:   Hypoxia Given patient's increased oxygen requirements in absence of HF exacerbation and hx of pulmonary HTN, will submit a referral to pulmonary medicine  Will also work with health equipment company to help patient get portable oxygen to use in order to run errands outside of the home   OSA (obstructive sleep apnea) Patient does not use CPAP due to discomfort   Pulmonary HTN ([place]) Will refer to pulmonary for evaluation given increasing oxygen requirement      Eulis Foster, MD [place]

## 2022-06-20 ENCOUNTER — Encounter: Payer: Self-pay | Admitting: Cardiology

## 2022-06-20 ENCOUNTER — Telehealth: Payer: Self-pay

## 2022-06-20 ENCOUNTER — Encounter: Payer: Self-pay | Admitting: Family Medicine

## 2022-06-20 NOTE — Telephone Encounter (Signed)
Boneta Lucks [facility] SLP calls nurse line requesting verbal orders for [facility] speech as follows.   2 week 1  1 week 2  1x every other week for week 6   Verbal order given.

## 2022-06-20 NOTE — Telephone Encounter (Signed)
[name], Danielle; Stockwell, Farley Ly, CMA; Heywood Footman; New, Bradley Can I get an order for a best fit?   [employer] is needing an order for best fit.   Will forward to PCP.

## 2022-06-21 ENCOUNTER — Other Ambulatory Visit: Payer: Self-pay | Admitting: Family Medicine

## 2022-06-21 ENCOUNTER — Other Ambulatory Visit: Payer: Self-pay

## 2022-06-21 ENCOUNTER — Other Ambulatory Visit: Payer: Self-pay | Admitting: Adult Health

## 2022-06-21 DIAGNOSIS — I480 Paroxysmal atrial fibrillation: Secondary | ICD-10-CM

## 2022-06-21 DIAGNOSIS — K5901 Slow transit constipation: Secondary | ICD-10-CM

## 2022-06-21 MED ORDER — APIXABAN 5 MG PO TABS: 5.0000 mg | ORAL_TABLET | Freq: Two times a day (BID) | ORAL | 1 refills | Status: DC

## 2022-06-21 MED ORDER — GABAPENTIN 300 MG PO CAPS
300.0000 mg | ORAL_CAPSULE | ORAL | 1 refills | Status: DC | PRN
  Filled 2022-06-21: qty 60, 60d supply, fill #0

## 2022-06-21 MED ORDER — POLYETHYLENE GLYCOL 3350 17 GM/SCOOP PO POWD: 17.0000 g | Freq: Two times a day (BID) | ORAL | 3 refills | Status: AC | PRN

## 2022-06-21 NOTE — Telephone Encounter (Signed)
Prescription refill request for Eliquis received. Indication: Afib  Last office visit: 03/02/22 Charlcie Cradle)  Scr: 1.07 (05/29/22) Age: 71 Weight: 110.2kg  Appropriate dose and refill sent to requested pharmacy.

## 2022-06-22 ENCOUNTER — Other Ambulatory Visit: Payer: Self-pay

## 2022-06-25 ENCOUNTER — Other Ambulatory Visit: Payer: Self-pay | Admitting: Family Medicine

## 2022-06-25 DIAGNOSIS — I272 Pulmonary hypertension, unspecified: Secondary | ICD-10-CM | POA: Insufficient documentation

## 2022-06-25 DIAGNOSIS — R0902 Hypoxemia: Secondary | ICD-10-CM

## 2022-06-25 NOTE — Assessment & Plan Note (Signed)
Patient does not use CPAP due to discomfort

## 2022-06-25 NOTE — Assessment & Plan Note (Signed)
Will refer to pulmonary for evaluation given increasing oxygen requirement

## 2022-06-25 NOTE — Assessment & Plan Note (Signed)
Given patient's increased oxygen requirements in absence of HF exacerbation and hx of pulmonary HTN, will submit a referral to pulmonary medicine  Will also work with health equipment company to help patient get portable oxygen to use in order to run errands outside of the home

## 2022-06-26 NOTE — Progress Notes (Signed)
[name] with [employer] advised order has been placed for patient.

## 2022-06-28 ENCOUNTER — Telehealth: Payer: Self-pay

## 2022-06-28 NOTE — Telephone Encounter (Signed)
Patient calls nurse line requesting authorization for Home Health PT with [facility].   Patient was discharged from [facility], however, would like to restart to gain more strength to be able to improve walking.   Called [employer] [phone]) and LVM asking what was needed from our office to process this request. Provided information for them to return my call with directions.   Talbot Grumbling, RN

## 2022-06-29 NOTE — Telephone Encounter (Signed)
Patient scheduled for 08/17/22.  [name],CMA

## 2022-06-30 ENCOUNTER — Encounter (HOSPITAL_BASED_OUTPATIENT_CLINIC_OR_DEPARTMENT_OTHER): Payer: Medicare PPO | Attending: General Surgery | Admitting: General Surgery

## 2022-06-30 DIAGNOSIS — L97512 Non-pressure chronic ulcer of other part of right foot with fat layer exposed: Secondary | ICD-10-CM | POA: Insufficient documentation

## 2022-06-30 DIAGNOSIS — I872 Venous insufficiency (chronic) (peripheral): Secondary | ICD-10-CM | POA: Diagnosis not present

## 2022-06-30 DIAGNOSIS — I890 Lymphedema, not elsewhere classified: Secondary | ICD-10-CM | POA: Diagnosis not present

## 2022-06-30 DIAGNOSIS — L97422 Non-pressure chronic ulcer of left heel and midfoot with fat layer exposed: Secondary | ICD-10-CM | POA: Insufficient documentation

## 2022-06-30 DIAGNOSIS — E1059 Type 1 diabetes mellitus with other circulatory complications: Secondary | ICD-10-CM | POA: Insufficient documentation

## 2022-06-30 DIAGNOSIS — I5032 Chronic diastolic (congestive) heart failure: Secondary | ICD-10-CM | POA: Diagnosis not present

## 2022-06-30 DIAGNOSIS — Z6841 Body Mass Index (BMI) 40.0 and over, adult: Secondary | ICD-10-CM | POA: Insufficient documentation

## 2022-06-30 DIAGNOSIS — L97412 Non-pressure chronic ulcer of right heel and midfoot with fat layer exposed: Secondary | ICD-10-CM | POA: Diagnosis not present

## 2022-06-30 DIAGNOSIS — E10622 Type 1 diabetes mellitus with other skin ulcer: Secondary | ICD-10-CM | POA: Diagnosis not present

## 2022-06-30 DIAGNOSIS — L97522 Non-pressure chronic ulcer of other part of left foot with fat layer exposed: Secondary | ICD-10-CM | POA: Diagnosis not present

## 2022-06-30 DIAGNOSIS — E1042 Type 1 diabetes mellitus with diabetic polyneuropathy: Secondary | ICD-10-CM | POA: Diagnosis not present

## 2022-07-03 ENCOUNTER — Ambulatory Visit: Payer: Medicare PPO | Admitting: Podiatry

## 2022-07-03 NOTE — Progress Notes (Signed)
[name], [name] ([id number]) Visit Report for 06/30/2022 Chief Complaint Document Details Patient Name: Date of Service: [name], [name]. 06/30/2022 3:00 PM Medical Record Number: [id number] Patient Account Number: [id number] Date of Birth/Sex: Treating RN: Jun 14, 1951 (71 y.o. [name] Primary Care Provider: Jim Like Other Clinician: Referring Provider: Treating Provider/Extender: Delorise Jackson in Treatment: 12 Information Obtained from: Patient Chief Complaint Patient presents for treatment of open ulcers due to venous insufficiency in the setting of poorly controlled DM1 and CHF Electronic Signature(s) Signed: 06/30/2022 3:04:57 PM By: Fredirick Maudlin MD FACS Entered By: Fredirick Maudlin on 06/30/2022 15:04:57 -------------------------------------------------------------------------------- HPI Details Patient Name: Date of Service: [name], [name] 06/30/2022 3:00 PM Medical Record Number: [id number] Patient Account Number: [id number] Date of Birth/Sex: Treating RN: 06-17-1951 (71 y.o. [name] Primary Care Provider: Jim Like Other Clinician: Referring Provider: Treating Provider/Extender: Delorise Jackson in Treatment: 12 History of Present Illness HPI Description: ADMISSION 04/03/2022 This is a 71 year old woman with a past medical history notable for type 1 diabetes mellitus, poorly controlled with her most recent A1c being 8.3, congestive heart failure with recent admission for exacerbation secondary to noncompliance with diuretics, as well as history of pontine stroke and oxygen dependence. She has wounds on her bilateral lower extremities. She says they have been present for about 2 months and initially started as blisters. She says that she has compression stockings, but does not wear them because they are too difficult to get on. ABIs done in clinic were 1.4 and 1.5, suggesting  some degree of noncompressibility. She also has a wound on her right heel that she says is secondary to it getting bumped frequently as she transfers. She came to clinic with just dry dressings on her legs. She does have home health care, as well as her sister that have been assisting her with her dressings. She has multiple wounds. On the left medial calf, just above her ankle, there is a somewhat geographic wound with slough and some epithelialization. On the anterior tibial surface and lateral calf, the wounds are quite desiccated with yellow eschar present. On the right anterior tibial surface, the wounds are gritty with thin slough and eschar. The posterior right ankle wound is just above the calcaneus overlying the Achilles and has thick yellow-gray slough and a bit of odor. No purulent drainage appreciated. 04/10/2022: The wounds on her left leg are closed. On the right, the anterior tibial surface wounds are epithelializing and much cleaner. There is just a layer of biofilm on the open areas. On the posterior right ankle wound, there is a decreased volume of slough and no odor. We have been using Hydrofera Blue to all sites under compression. 04/17/2022: The wounds on her left leg remained closed and she did bring her juxta lite stockings with her today. On the right, the more distal wound is closed and the proximal wound has epithelialized significantly since her last visit. At the posterior right ankle, she still has accumulated some slough and some periwound callus but there is no odor or significant drainage. 04/24/2022: The right anterior leg wounds are closed. She still has a wound on her posterior right ankle that looks about the same as last week. She did not bring her right sided juxta light with her today but is wearing the left sided stocking. 05/01/2022: She has a tiny abrasion on her left anterior tibia which we did not add to her wound count today. The  wound on her posterior right  ankle is a little bit smaller with a little accumulated slough. No drainage or concern for infection. 05/08/2022: She continues to forget to bring her juxta lite stocking with her so she remains in a compression wrap despite healing of the right anterior leg wounds. The wound on her posterior right ankle/calcaneus is a bit smaller today with just a little bit of accumulated slough. She is wearing a juxta lite stocking on the left leg. 05/15/2022: She says that she has been wearing her juxta lite stockings at home, but today she is wearing regular compression stockings that she purchased from [employer]; the juxta lite stockings are in the laundry. The wound on her posterior right ankle/calcaneus continues to contract and has just a little bit of accumulated slough. Unfortunately, her edema control is suboptimal on the left and she has developed several small blisters with a larger 1 at the medial calf. 05/23/2022: The patient has been wearing her juxta lite stockings. Unfortunately, she has developed a large blister on her right lower leg just above the ankle. The skin overlying it is still intact. The blister that had opened on her left medial calf is smaller; and the other small blistered sites are closed. The posterior right ankle/calcaneus is about the same today. 06/07/2022: The only remaining open wound is on her posterior right calcaneus. The majority of this is covered with eschar. 06/30/2022: I am not sure why the patient did not return to clinic until today, but the ulcer on her right heel has healed. Unfortunately, she has developed a new ulcer on her left lateral foot. From the position of it, it looks as though the outward rotation of her lower leg at rest is probably causing the lateral midfoot to rub or press against the metal leg rests of her wheelchair. She is not padding the chair when she rests her leg on the leg rests. The new ulcer is fairly superficial with just the fat layer exposed. It  is quite tender. No erythema, induration, or purulent drainage identified. Electronic Signature(s) Signed: 06/30/2022 3:07:04 PM By: Fredirick Maudlin MD FACS Entered By: Fredirick Maudlin on 06/30/2022 15:07:04 -------------------------------------------------------------------------------- Physical Exam Details Patient Name: Date of Service: [name], [name] 06/30/2022 3:00 PM Medical Record Number: [id number] Patient Account Number: [id number] Date of Birth/Sex: Treating RN: November 24, 1951 (71 y.o. [name] Primary Care Provider: Jim Like Other Clinician: Referring Provider: Treating Provider/Extender: [name] Weeks in Treatment: 12 Constitutional Hypertensive, asymptomatic. Slightly bradycardic, asymptomatic.. . . No acute distress.Marland Kitchen Respiratory Normal work of breathing on supplemental oxygen.. Notes 06/30/2022: She has developed a new ulcer on her left lateral foot. From the position of it, it looks as though the outward rotation of her lower leg at rest is probably causing the lateral midfoot to rub or press against the metal leg rests of her wheelchair. The new ulcer is fairly superficial with just the fat layer exposed. It is quite tender. No erythema, induration, or purulent drainage identified. Electronic Signature(s) Signed: 06/30/2022 3:08:07 PM By: Fredirick Maudlin MD FACS Entered By: Fredirick Maudlin on 06/30/2022 15:08:06 -------------------------------------------------------------------------------- Physician Orders Details Patient Name: Date of Service: [name], [name] 06/30/2022 3:00 PM Medical Record Number: [id number] Patient Account Number: [id number] Date of Birth/Sex: Treating RN: July 28, 1951 (71 y.o. 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 - Dr. Celine Ahr - Room 2 - Return A Bathing/ Shower/ Hygiene May shower and wash wound with soap and water. - with dressing changes Edema Control - Lymphedema / SCD / Other Bilateral Lower Extremities Elevate legs to the level of the heart or above for 30 minutes daily and/or when sitting, a frequency of: - throughout the day Patient to wear own compression stockings every day. - juxtalites to both legs Exercise regularly Off-Loading Other: - Prevalon boot to both feet. Please put pillow between foot and foot rest so the foot is not resting on the metal footrest. Home Health Admit to [place] for wound care. May utilize formulary equivalent dressing for wound treatment orders unless otherwise specified. New wound care orders this week; continue Home Health for wound care. May utilize formulary equivalent dressing for wound treatment orders unless otherwise specified. Other Home Health Orders/Instructions: [facility] Wound Treatment Wound #11 - Foot Cleanser: Soap and Water Every Other Day/15 Days Discharge Instructions: May shower and wash wound with dial antibacterial soap and water prior to dressing change. Cleanser: Wound Cleanser Every Other Day/15 Days Discharge Instructions: Cleanse the wound with wound cleanser prior to applying a clean dressing using gauze sponges, not tissue or cotton balls. Prim Dressing: KerraCel Ag Gelling Fiber Dressing, 2x2 in (silver alginate) (DME) (Generic)  Every Other Day/15 Days ary Discharge Instructions: Apply silver alginate to wound bed as instructed Secondary Dressing: ALLEVYN Gentle Border, 4x4 (in/in) (DME) (Generic) Every Other Day/15 Days Discharge Instructions: Apply over primary dressing as directed. Electronic Signature(s) Signed: 06/30/2022 5:20:13 PM By: Adline Peals Signed: 07/03/2022 12:30:27 PM By: Fredirick Maudlin MD FACS Previous Signature: 06/30/2022 4:49:22 PM Version By: Fredirick Maudlin MD FACS Previous Signature: 06/30/2022 3:14:22 PM Version By: Fredirick Maudlin MD FACS Entered By: Adline Peals on 06/30/2022 16:59:57 -------------------------------------------------------------------------------- Problem List Details Patient Name: Date of Service: [name], [name] [place] [name] 06/30/2022 3:00 PM Medical Record Number: [id number] Patient Account Number: [id number] Date of Birth/Sex: Treating RN: 1951-07-26 (71 y.o. [name] Primary Care Provider: Jim Like Other Clinician: Referring Provider: Treating Provider/Extender: [name] Weeks in Treatment: 12 Active Problems ICD-10 Encounter Code Description Active Date MDM Diagnosis I89.0 Lymphedema, not elsewhere classified 04/03/2022 No Yes L97.67 Chronic diastolic (congestive) heart failure 04/03/2022 No Yes E10.59 Type 1 diabetes mellitus with other circulatory complications 3/41/9379 No Yes E10.42 Type 1 diabetes mellitus with diabetic polyneuropathy 04/03/2022 No Yes Z68.41 Body mass index [BMI]40.0-44.9, adult 04/03/2022 No Yes E10.622 Type 1 diabetes mellitus with other skin ulcer 04/03/2022 No Yes L97.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 with unspecified 06/30/2022 No Yes severity Inactive Problems ICD-10 Code Description Active Date Inactive Date L97.212 Non-pressure chronic ulcer of right calf with fat layer exposed 04/03/2022 04/03/2022 L97.222 Non-pressure chronic ulcer of left calf with fat  layer exposed 04/03/2022 04/03/2022 L97.312 Non-pressure chronic ulcer of right ankle with fat layer exposed 04/03/2022 04/03/2022 L97.821 Non-pressure chronic ulcer of other part of left lower leg limited to breakdown of skin 05/15/2022 05/15/2022 L97.811 Non-pressure chronic ulcer of other part of right lower leg limited to breakdown of skin 05/23/2022 05/23/2022 Resolved Problems  Electronic Signature(s) Signed: 06/30/2022 3:03:29 PM By: Fredirick Maudlin MD FACS Entered By: Fredirick Maudlin on 06/30/2022 15:03:29 -------------------------------------------------------------------------------- Progress Note Details Patient Name: Date of Service: [name], [name] 06/30/2022 3:00 PM Medical Record Number: [id number] Patient Account Number: [id number] Date of Birth/Sex: Treating RN: 12/11/1951 (71 y.o. [name] Primary Care Provider: Jim Like Other Clinician: Referring Provider: Treating Provider/Extender: Delorise Jackson in Treatment: 12 Subjective Chief Complaint Information obtained from Patient Patient presents for treatment of open ulcers due to venous insufficiency in the setting of poorly controlled DM1 and CHF History of Present Illness (HPI) ADMISSION 04/03/2022 This is a 71 year old woman with a past medical history notable for type 1 diabetes mellitus, poorly controlled with her most recent A1c being 8.3, congestive heart failure with recent admission for exacerbation secondary to noncompliance with diuretics, as well as history of pontine stroke and oxygen dependence. She has wounds on her bilateral lower extremities. She says they have been present for about 2 months and initially started as blisters. She says that she has compression stockings, but does not wear them because they are too difficult to get on. ABIs done in clinic were 1.4 and 1.5, suggesting some degree of noncompressibility. She also has a wound on her right heel that she  says is secondary to it getting bumped frequently as she transfers. She came to clinic with just dry dressings on her legs. She does have home health care, as well as her sister that have been assisting her with her dressings. She has multiple wounds. On the left medial calf, just above her ankle, there is a somewhat geographic wound with slough and some epithelialization. On the anterior tibial surface and lateral calf, the wounds are quite desiccated with yellow eschar present. On the right anterior tibial surface, the wounds are gritty with thin slough and eschar. The posterior right ankle wound is just above the calcaneus overlying the Achilles and has thick yellow-gray slough and a bit of odor. No purulent drainage appreciated. 04/10/2022: The wounds on her left leg are closed. On the right, the anterior tibial surface wounds are epithelializing and much cleaner. There is just a layer of biofilm on the open areas. On the posterior right ankle wound, there is a decreased volume of slough and no odor. We have been using Hydrofera Blue to all sites under compression. 04/17/2022: The wounds on her left leg remained closed and she did bring her juxta lite stockings with her today. On the right, the more distal wound is closed and the proximal wound has epithelialized significantly since her last visit. At the posterior right ankle, she still has accumulated some slough and some periwound callus but there is no odor or significant drainage. 04/24/2022: The right anterior leg wounds are closed. She still has a wound on her posterior right ankle that looks about the same as last week. She did not bring her right sided juxta light with her today but is wearing the left sided stocking. 05/01/2022: She has a tiny abrasion on her left anterior tibia which we did not add to her wound count today. The wound on her posterior right ankle is a little bit smaller with a little accumulated slough. No drainage or concern  for infection. 05/08/2022: She continues to forget to bring her juxta lite stocking with her so she remains in a compression wrap despite healing of the right anterior leg wounds. The wound on her posterior right ankle/calcaneus is a bit smaller today  with just a little bit of accumulated slough. She is wearing a juxta lite stocking on the left leg. 05/15/2022: She says that she has been wearing her juxta lite stockings at home, but today she is wearing regular compression stockings that she purchased from [employer]; the juxta lite stockings are in the laundry. The wound on her posterior right ankle/calcaneus continues to contract and has just a little bit of accumulated slough. Unfortunately, her edema control is suboptimal on the left and she has developed several small blisters with a larger 1 at the medial calf. 05/23/2022: The patient has been wearing her juxta lite stockings. Unfortunately, she has developed a large blister on her right lower leg just above the ankle. The skin overlying it is still intact. The blister that had opened on her left medial calf is smaller; and the other small blistered sites are closed. The posterior right ankle/calcaneus is about the same today. 06/07/2022: The only remaining open wound is on her posterior right calcaneus. The majority of this is covered with eschar. 06/30/2022: I am not sure why the patient did not return to clinic until today, but the ulcer on her right heel has healed. Unfortunately, she has developed a new ulcer on her left lateral foot. From the position of it, it looks as though the outward rotation of her lower leg at rest is probably causing the lateral midfoot to rub or press against the metal leg rests of her wheelchair. She is not padding the chair when she rests her leg on the leg rests. The new ulcer is fairly superficial with just the fat layer exposed. It is quite tender. No erythema, induration, or purulent drainage identified. Patient  History Information obtained from Patient. Family History Heart Disease - Mother, Hypertension - Mother,Father, Stroke - Child. Social History Never smoker, Marital Status - Divorced, Alcohol Use - Never, Drug Use - No History, Caffeine Use - Never. Medical History Eyes Patient has history of Cataracts Hematologic/Lymphatic Patient has history of Anemia Respiratory Patient has history of Sleep Apnea Cardiovascular Patient has history of Congestive Heart Failure, Hypertension Endocrine Patient has history of Type I Diabetes Oncologic Patient has history of Received Radiation Hospitalization/Surgery History - pacemaker implant. - loop recorder insertion and removal. - esophagogastroduodenoscopy. - TEE without cardioversion. - colonoscopy. - left and right heart catheterization. - abdominal angiogram. - thyroidectomy. - abdominal hysterectomy. - ankle fracture surgery. Medical A Surgical History Notes nd Ear/Nose/Mouth/Throat edentulous, GERD, seasonal allergies, trouble swallowing Hematologic/Lymphatic hyperlipidemia Cardiovascular complete heart block, bradycardia, paroxysmal atrial fibrillation, pacemaker Gastrointestinal hemorrhoids Endocrine hypothyroidism Genitourinary proteinuria, frequent UTIs Musculoskeletal decreased ROM L shoulder, hemiparesis affecting L side, arthritis Neurologic benign paroxysmal positional vertigo, intracranial vascular stenosis, lacunar infarction, stroke Oncologic thyroid cancer Psychiatric depression Objective Constitutional Hypertensive, asymptomatic. Slightly bradycardic, asymptomatic.Marland Kitchen No acute distress.. Vitals Time Taken: 2:33 PM, Height: 67 in, Weight: 243 lbs, BMI: 38.1, Temperature: 97.9 F, Pulse: 56 bpm, Respiratory Rate: 20 breaths/min, Blood Pressure: 162/77 mmHg, Capillary Blood Glucose: 125 mg/dl. Respiratory Normal work of breathing on supplemental oxygen.. General Notes: 06/30/2022: She has developed a new ulcer on her  left lateral foot. From the position of it, it looks as though the outward rotation of her lower leg at rest is probably causing the lateral midfoot to rub or press against the metal leg rests of her wheelchair. The new ulcer is fairly superficial with just the fat layer exposed. It is quite tender. No erythema, induration, or purulent drainage identified. Integumentary (Hair, Skin) Wound #11 status is  Open. Original cause of wound was Pressure Injury. The date acquired was: 06/16/2022. The wound is located on the Foot. The wound measures 1.4cm length x 1.5cm width x 0.1cm depth; 1.649cm^2 area and 0.165cm^3 volume. There is Fat Layer (Subcutaneous Tissue) exposed. There is no tunneling or undermining noted. There is a medium amount of serosanguineous drainage noted. The wound margin is distinct with the outline attached to the wound base. There is large (67-100%) red granulation within the wound bed. There is no necrotic tissue within the wound bed. Wound #2 status is Open. Original cause of wound was Gradually Appeared. The date acquired was: 01/25/2022. The wound has been in treatment 12 weeks. The wound is located on the Right,Posterior Calcaneus. The wound measures 0cm length x 0cm width x 0cm depth; 0cm^2 area and 0cm^3 volume. There is Fat Layer (Subcutaneous Tissue) exposed. There is a none present amount of drainage noted. The wound margin is distinct with the outline attached to the wound base. There is small (1-33%) red, pink granulation within the wound bed. There is a large (67-100%) amount of necrotic tissue within the wound bed including Eschar. Assessment Active Problems ICD-10 Lymphedema, not elsewhere classified Chronic diastolic (congestive) heart failure Type 1 diabetes mellitus with other circulatory complications Type 1 diabetes mellitus with diabetic polyneuropathy Body mass index [BMI]40.0-44.9, adult Type 1 diabetes mellitus with other skin ulcer Non-pressure chronic ulcer  of unspecified heel and midfoot with unspecified severity Plan Follow-up Appointments: Return Appointment in 1 week. - Dr. Celine Ahr - Room 2 - Bathing/ Shower/ Hygiene: May shower and wash wound with soap and water. - with dressing changes Edema Control - Lymphedema / SCD / Other: Elevate legs to the level of the heart or above for 30 minutes daily and/or when sitting, a frequency of: - throughout the day Patient to wear own compression stockings every day. - juxtalites to both legs Exercise regularly Off-Loading: Other: - Prevalon boot to both feet. Please put pillow between foot and foot rest so the foot is not resting on the metal footrest. Home Health: No change in wound care orders this week; continue Home Health for wound care. May utilize formulary equivalent dressing for wound treatment orders unless otherwise specified. Other Home Health Orders/Instructions: [name] WOUND #11: - Foot Wound Laterality: Cleanser: Soap and Water Every Other Day/15 Days Discharge Instructions: May shower and wash wound with dial antibacterial soap and water prior to dressing change. Cleanser: Wound Cleanser Every Other Day/15 Days Discharge Instructions: Cleanse the wound with wound cleanser prior to applying a clean dressing using gauze sponges, not tissue or cotton balls. Prim Dressing: KerraCel Ag Gelling Fiber Dressing, 2x2 in (silver alginate) Every Other Day/15 Days ary Discharge Instructions: Apply silver alginate to wound bed as instructed Secondary Dressing: ALLEVYN Gentle Border, 4x4 (in/in) Every Other Day/15 Days Discharge Instructions: Apply over primary dressing as directed. 06/30/2022: She has developed a new ulcer on her left lateral foot. From the position of it, it looks as though the outward rotation of her lower leg at rest is probably causing the lateral midfoot to rub or press against the metal leg rests of her wheelchair. The new ulcer is fairly superficial with just the fat  layer exposed. It is quite tender. No erythema, induration, or purulent drainage identified. No debridement was necessary today. She is wearing her compression stockings, but it seems that the new wheelchair she has is applying either pressure or friction to her foot. We will use silver alginate with a foam  border dressing and order her bilateral Prevalon boots, as her heel ulcer has freshly closed, but she is certainly at risk of developing another similar wound. She will follow-up in 2 weeks. Electronic Signature(s) Signed: 06/30/2022 3:09:19 PM By: Fredirick Maudlin MD FACS Entered By: Fredirick Maudlin on 06/30/2022 15:09:19 -------------------------------------------------------------------------------- HxROS Details Patient Name: Date of Service: [name], [name] 06/30/2022 3:00 PM Medical Record Number: [id number] Patient Account Number: [id number] Date of Birth/Sex: Treating RN: 08-04-1951 (71 y.o. [name] Primary Care Provider: Jim Like Other Clinician: Referring Provider: Treating Provider/Extender: Delorise Jackson in Treatment: 12 Information Obtained From Patient Eyes Medical History: Positive for: Cataracts Ear/Nose/Mouth/Throat Medical History: Past Medical History Notes: edentulous, GERD, seasonal allergies, trouble swallowing Hematologic/Lymphatic Medical History: Positive for: Anemia Past Medical History Notes: hyperlipidemia Respiratory Medical History: Positive for: Sleep Apnea Cardiovascular Medical History: Positive for: Congestive Heart Failure; Hypertension Past Medical History Notes: complete heart block, bradycardia, paroxysmal atrial fibrillation, pacemaker Gastrointestinal Medical History: Past Medical History Notes: hemorrhoids Endocrine Medical History: Positive for: Type I Diabetes Past Medical History Notes: hypothyroidism Time with diabetes: 33 yrs Treated with: Insulin Blood sugar tested  every day: Yes Tested : on pump Genitourinary Medical History: Past Medical History Notes: proteinuria, frequent UTIs Musculoskeletal Medical History: Past Medical History Notes: decreased ROM L shoulder, hemiparesis affecting L side, arthritis Neurologic Medical History: Past Medical History Notes: benign paroxysmal positional vertigo, intracranial vascular stenosis, lacunar infarction, stroke Oncologic Medical History: Positive for: Received Radiation Past Medical History Notes: thyroid cancer Psychiatric Medical History: Past Medical History Notes: depression HBO Extended History Items Eyes: Cataracts Immunizations Pneumococcal Vaccine: Received Pneumococcal Vaccination: Yes Received Pneumococcal Vaccination On or After 60th Birthday: Yes Implantable Devices Yes Hospitalization / Surgery History Type of Hospitalization/Surgery pacemaker implant loop recorder insertion and removal esophagogastroduodenoscopy TEE without cardioversion colonoscopy left and right heart catheterization abdominal angiogram thyroidectomy abdominal hysterectomy ankle fracture surgery Family and Social History Heart Disease: Yes - Mother; Hypertension: Yes - Mother,Father; Stroke: Yes - Child; Never smoker; Marital Status - Divorced; Alcohol Use: Never; Drug Use: No History; Caffeine Use: Never; Financial Concerns: No; Food, Clothing or Shelter Needs: No; Support System Lacking: No; Transportation Concerns: No Electronic Signature(s) Signed: 06/30/2022 3:14:22 PM By: Fredirick Maudlin MD FACS Signed: 06/30/2022 5:20:13 PM By: Adline Peals Entered By: Fredirick Maudlin on 06/30/2022 15:07:10 -------------------------------------------------------------------------------- SuperBill Details Patient Name: Date of Service: [name], [name] 06/30/2022 Medical Record Number: [id number] Patient Account Number: [id number] Date of Birth/Sex: Treating RN: 1951/05/04 (71 y.o. 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 Facility Procedures CPT4 Code: 91638466 Description: 99213 - WOUND CARE VISIT-LEV 3 EST PT Modifier: Quantity: 1 Physician Procedures : CPT4 Code Description Modifier [id number] - WC PHYS LEVEL 3 - EST PT 25 ICD-10 Diagnosis Description 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

## 2022-07-03 NOTE — Progress Notes (Signed)
[name], [name] ([id number]) Visit Report for 06/30/2022 Arrival Information Details Patient Name: Date of Service: [place], [name] 06/30/2022 3:00 PM Medical Record Number: [id number] Patient Account Number: [id number] Date of Birth/Sex: Treating RN: [birth year] (71 y.o. [name] Primary Care [name]: [name] Other Clinician: Referring [name]: Treating [name]/Extender: [name] in Treatment: 12 Visit Information History Since Last Visit Added or deleted any medications: No Patient Arrived: Wheel Chair Any new allergies or adverse reactions: No Arrival Time: 14:33 Had a fall or experienced change in No Accompanied By: brother activities of daily living that may affect Transfer Assistance: EasyPivot Patient Lift risk of falls: Patient Identification Verified: Yes Signs or symptoms of abuse/neglect since last visito No Secondary Verification Process Completed: Yes Hospitalized since last visit: No Patient Requires Transmission-Based Precautions: No Implantable device outside of the clinic excluding No Patient Has Alerts: Yes cellular tissue based products placed in the center Patient Alerts: Patient on Blood Thinner since last visit: pacemaker Has Dressing in Place as Prescribed: Yes Pain Present Now: No Electronic Signature(s) Signed: 07/03/2022 4:36:07 PM By: [name] Entered By: [name] on 06/30/2022 14:33:22 -------------------------------------------------------------------------------- Clinic Level of Care Assessment Details Patient Name: Date of Service: [name], [name] [name] M. 06/30/2022 3:00 PM Medical Record Number: [id number] Patient Account Number: [id number] Date of Birth/Sex: Treating RN: 1951/11/30 (71 y.o. [name] Primary Care [name]: [name] Other Clinician: Referring [name]: Treating [name]/Extender: [name] in Treatment:  12 Clinic Level of Care Assessment Items TOOL 4 Quantity Score X- 1 0 Use when only an EandM is performed on FOLLOW-UP visit ASSESSMENTS - Nursing Assessment / Reassessment X- 1 10 Reassessment of Co-morbidities (includes updates in patient status) X- 1 5 Reassessment of Adherence to Treatment Plan ASSESSMENTS - Wound and Skin A ssessment / Reassessment X - Simple Wound Assessment / Reassessment - one wound 1 5 '[]'$  - 0 Complex Wound Assessment / Reassessment - multiple wounds '[]'$  - 0 Dermatologic / Skin Assessment (not related to wound area) ASSESSMENTS - Focused Assessment X- 1 5 Circumferential Edema Measurements - multi extremities '[]'$  - 0 Nutritional Assessment / Counseling / Intervention X- 1 5 Lower Extremity Assessment (monofilament, tuning fork, pulses) '[]'$  - 0 Peripheral Arterial Disease Assessment (using hand held doppler) ASSESSMENTS - Ostomy and/or Continence Assessment and Care '[]'$  - 0 Incontinence Assessment and Management '[]'$  - 0 Ostomy Care Assessment and Management (repouching, etc.) PROCESS - Coordination of Care X - Simple Patient / Family Education for ongoing care 1 15 '[]'$  - 0 Complex (extensive) Patient / Family Education for ongoing care X- 1 10 Staff obtains Programmer, systems, Records, T Results / Process Orders est '[]'$  - 0 Staff telephones HHA, Nursing Homes / Clarify orders / etc '[]'$  - 0 Routine Transfer to another Facility (non-emergent condition) '[]'$  - 0 Routine Hospital Admission (non-emergent condition) '[]'$  - 0 New Admissions / Biomedical engineer / Ordering NPWT Apligraf, etc. , '[]'$  - 0 Emergency Hospital Admission (emergent condition) X- 1 10 Simple Discharge Coordination '[]'$  - 0 Complex (extensive) Discharge Coordination PROCESS - Special Needs '[]'$  - 0 Pediatric / Minor Patient Management '[]'$  - 0 Isolation Patient Management '[]'$  - 0 Hearing / Language / Visual special needs '[]'$  - 0 Assessment of Community assistance (transportation, D/C  planning, etc.) '[]'$  - 0 Additional assistance / Altered mentation '[]'$  - 0 Support Surface(s) Assessment (bed, cushion, seat, etc.) INTERVENTIONS - Wound Cleansing / Measurement X - Simple Wound Cleansing - one wound  1 5 '[]'$  - 0 Complex Wound Cleansing - multiple wounds X- 1 5 Wound Imaging (photographs - any number of wounds) '[]'$  - 0 Wound Tracing (instead of photographs) '[]'$  - 0 Simple Wound Measurement - one wound '[]'$  - 0 Complex Wound Measurement - multiple wounds INTERVENTIONS - Wound Dressings X - Small Wound Dressing one or multiple wounds 1 10 '[]'$  - 0 Medium Wound Dressing one or multiple wounds '[]'$  - 0 Large Wound Dressing one or multiple wounds '[]'$  - 0 Application of Medications - topical '[]'$  - 0 Application of Medications - injection INTERVENTIONS - Miscellaneous '[]'$  - 0 External ear exam '[]'$  - 0 Specimen Collection (cultures, biopsies, blood, body fluids, etc.) '[]'$  - 0 Specimen(s) / Culture(s) sent or taken to Lab for analysis '[]'$  - 0 Patient Transfer (multiple staff / Civil Service fast streamer / Similar devices) '[]'$  - 0 Simple Staple / Suture removal (25 or less) '[]'$  - 0 Complex Staple / Suture removal (26 or more) '[]'$  - 0 Hypo / Hyperglycemic Management (close monitor of Blood Glucose) '[]'$  - 0 Ankle / Brachial Index (ABI) - do not check if billed separately X- 1 5 Vital Signs Has the patient been seen at the hospital within the last three years: Yes Total Score: 90 Level Of Care: New/Established - Level 3 Electronic Signature(s) Signed: 06/30/2022 5:20:13 PM By: [name] Entered By: [name] on 06/30/2022 17:01:35 -------------------------------------------------------------------------------- Encounter Discharge Information Details Patient Name: Date of Service: [name], [name] SE [name] M. 06/30/2022 3:00 PM Medical Record Number: [id number] Patient Account Number: [id number] Date of Birth/Sex: Treating RN: 08/21/1951 (71 y.o. [name] Primary Care  [name]: [name] Other Clinician: Referring [name]: Treating [name]/Extender: [name] in Treatment: 12 Encounter Discharge Information Items Discharge Condition: Stable Ambulatory Status: Wheelchair Discharge Destination: Home Transportation: Private Auto Accompanied By: self Schedule Follow-up Appointment: Yes Clinical Summary of Care: Patient Declined Electronic Signature(s) Signed: 06/30/2022 5:20:13 PM By: Sabas Sous By: [name] on 06/30/2022 15:25:02 -------------------------------------------------------------------------------- Lower Extremity Assessment Details Patient Name: Date of Service: [name], [name] SE [name] M. 06/30/2022 3:00 PM Medical Record Number: [id number] Patient Account Number: [id number] Date of Birth/Sex: Treating RN: 1950-12-27 (71 y.o. [name] Primary Care Pamila Mendibles: [name] Other Clinician: Referring Madoline Bhatt: Treating Odetta Forness/Extender: [name] Weeks in Treatment: 12 Edema Assessment Assessed: [Left: No] Patrice Paradise: No] [Left: Edema] [Right: :] Calf Left: Right: Point of Measurement: From Medial Instep 38.4 cm 35.2 cm Ankle Left: Right: Point of Measurement: From Medial Instep 23.7 cm 22.3 cm Vascular Assessment Pulses: Dorsalis Pedis Palpable: [Left:Yes] [Right:Yes] Electronic Signature(s) Signed: 06/30/2022 5:20:13 PM By: [name] Signed: 07/03/2022 4:36:07 PM By: [name] Entered By: [name] on 06/30/2022 14:37:20 -------------------------------------------------------------------------------- Multi Wound Chart Details Patient Name: Date of Service: [name], [name] SE [name] M. 06/30/2022 3:00 PM Medical Record Number: [id number] Patient Account Number: [id number] Date of Birth/Sex: Treating RN: 1951-03-18 (71 y.o. [name] Primary Care Rosemary Mossbarger: [name] Other Clinician: Referring  Levon Boettcher: Treating Shacola Schussler/Extender: [name] Weeks in Treatment: 12 Vital Signs Height(in): 44 Capillary Blood Glucose(mg/dl): 125 Weight(lbs): 243 Pulse(bpm): 81 Body Mass Index(BMI): 38.1 Blood Pressure(mmHg): 162/77 Temperature(F): 97.9 Respiratory Rate(breaths/min): 20 Photos: [N/A:N/A] Foot Right, Posterior Calcaneus N/A Wound Location: Pressure Injury Gradually Appeared N/A Wounding Event: Diabetic Wound/Ulcer of the Lower Diabetic Wound/Ulcer of the Lower N/A Primary Etiology: Extremity Extremity Cataracts, Anemia, Sleep Apnea, Cataracts, Anemia, Sleep Apnea, N/A Comorbid History: Congestive Heart Failure, Congestive Heart Failure, Hypertension, Type I Diabetes, Hypertension, Type  I Diabetes, Received Radiation Received Radiation 06/16/2022 01/25/2022 N/A Date Acquired: 0 12 N/A Weeks of Treatment: Open Open N/A Wound Status: No No N/A Wound Recurrence: 1.4x1.5x0.1 0x0x0 N/A Measurements L x W x D (cm) 1.649 0 N/A A (cm) : rea 0.165 0 N/A Volume (cm) : 0.00% 100.00% N/A % Reduction in A rea: 0.00% 100.00% N/A % Reduction in Volume: Grade 2 Grade 2 N/A Classification: Medium None Present N/A Exudate A mount: Serosanguineous N/A N/A Exudate Type: red, brown N/A N/A Exudate Color: Distinct, outline attached Distinct, outline attached N/A Wound Margin: Large (67-100%) Small (1-33%) N/A Granulation A mount: Red Red, Pink N/A Granulation Quality: None Present (0%) Large (67-100%) N/A Necrotic A mount: N/A Eschar N/A Necrotic Tissue: Fat Layer (Subcutaneous Tissue): Yes Fat Layer (Subcutaneous Tissue): Yes N/A Exposed Structures: Fascia: No Fascia: No Tendon: No Tendon: No Muscle: No Muscle: No Joint: No Joint: No Bone: No Bone: No Small (1-33%) Large (67-100%) N/A Epithelialization: Treatment Notes Electronic Signature(s) Signed: 06/30/2022 3:04:49 PM By: Fredirick Maudlin MD FACS Signed: 06/30/2022 5:20:13 PM By:  [name] Entered By: Fredirick Maudlin on 06/30/2022 15:04:48 -------------------------------------------------------------------------------- Multi-Disciplinary Care Plan Details Patient Name: Date of Service: [name], [name] [place] [name] M. 06/30/2022 3:00 PM Medical Record Number: [id number] Patient Account Number: [id number] Date of Birth/Sex: Treating RN: 1951-08-30 (71 y.o. [name] Primary Care Elis Rawlinson: [name] Other Clinician: Referring Keondra Haydu: Treating Jacobs Golab/Extender: [name] in Treatment: 12 Multidisciplinary Care Plan reviewed with physician Active Inactive Abuse / Safety / Falls / Self Care Management Nursing Diagnoses: History of Falls Impaired physical mobility Goals: Patient/caregiver will verbalize/demonstrate measures taken to prevent injury and/or falls Date Initiated: 04/03/2022 Target Resolution Date: 08/01/2022 Goal Status: Active Interventions: Assess fall risk on admission and as needed Assess: immobility, friction, shearing, incontinence upon admission and as needed Assess impairment of mobility on admission and as needed per policy Notes: Nutrition Nursing Diagnoses: Impaired glucose control: actual or potential Potential for alteratiion in Nutrition/Potential for imbalanced nutrition Goals: Patient/caregiver will maintain therapeutic glucose control Date Initiated: 04/03/2022 Target Resolution Date: 08/01/2022 Goal Status: Active Interventions: Assess HgA1c results as ordered upon admission and as needed Provide education on elevated blood sugars and impact on wound healing Treatment Activities: Patient referred to Primary Care Physician for further nutritional evaluation : 04/03/2022 Notes: Venous Leg Ulcer Nursing Diagnoses: Knowledge deficit related to disease process and management Potential for venous Insuffiency (use before diagnosis confirmed) Goals: Patient will maintain optimal edema  control Date Initiated: 04/03/2022 Target Resolution Date: 08/01/2022 Goal Status: Active Interventions: Assess peripheral edema status every visit. Compression as ordered Provide education on venous insufficiency Treatment Activities: Therapeutic compression applied : 04/03/2022 Notes: Electronic Signature(s) Signed: 06/30/2022 5:20:13 PM By: [name] Signed: 07/03/2022 4:36:07 PM By: [name] Entered By: [name] on 06/30/2022 14:39:30 -------------------------------------------------------------------------------- Pain Assessment Details Patient Name: Date of Service: [name], [name] SE [name] M. 06/30/2022 3:00 PM Medical Record Number: [id number] Patient Account Number: [id number] Date of Birth/Sex: Treating RN: [birth year] (71 y.o. [name] Primary Care Tyna Huertas: [name] Other Clinician: Referring Cesia Orf: Treating Brianah Hopson/Extender: [name] in Treatment: 12 Active Problems Location of Pain Severity and Description of Pain Patient Has Paino Yes Site Locations Rate the pain. Current Pain Level: 3 Pain Management and Medication Current Pain Management: Electronic Signature(s) Signed: 06/30/2022 5:20:13 PM By: [name] Signed: 07/03/2022 4:36:07 PM By: [name] Entered By: [name] on 06/30/2022 14:34:37 -------------------------------------------------------------------------------- Patient/Caregiver Education Details Patient Name: Date of Service: [name],  [name] 7/7/2023andnbsp3:00 PM Medical Record Number: [id number] Patient Account Number: [id number] Date of Birth/Gender: Treating RN: August 01, 1951 (71 y.o. [name] Primary Care Physician: [name] Other Clinician: Referring Physician: Treating Physician/Extender: [name] in Treatment: 12 Education Assessment Education Provided To: Patient Education Topics  Provided Wound/Skin Impairment: Methods: Explain/Verbal Responses: Reinforcements needed, State content correctly Electronic Signature(s) Signed: 07/03/2022 4:36:07 PM By: [name] Entered By: [name] on 06/30/2022 14:39:41 -------------------------------------------------------------------------------- Wound Assessment Details Patient Name: Date of Service: [name], [name] SE [name] M. 06/30/2022 3:00 PM Medical Record Number: [id number] Patient Account Number: [id number] Date of Birth/Sex: Treating RN: [birth year] (71 y.o. [name] Primary Care Arran Fessel: [name] Other Clinician: Referring Leandro Berkowitz: Treating Carlyon Nolasco/Extender: [name] Weeks in Treatment: 12 Wound Status Wound Number: 11 Primary Diabetic Wound/Ulcer of the Lower Extremity Etiology: Wound Location: Foot Wound Open Wounding Event: Pressure Injury Status: Date Acquired: 06/16/2022 Comorbid Cataracts, Anemia, Sleep Apnea, Congestive Heart Failure, Weeks Of Treatment: 0 History: Hypertension, Type I Diabetes, Received Radiation Clustered Wound: No Photos Wound Measurements Length: (cm) 1.4 Width: (cm) 1.5 Depth: (cm) 0.1 Area: (cm) 1.649 Volume: (cm) 0.165 % Reduction in Area: 0% % Reduction in Volume: 0% Epithelialization: Small (1-33%) Tunneling: No Undermining: No Wound Description Classification: Grade 2 Wound Margin: Distinct, outline attached Exudate Amount: Medium Exudate Type: Serosanguineous Exudate Color: red, brown Foul Odor After Cleansing: No Slough/Fibrino No Wound Bed Granulation Amount: Large (67-100%) Exposed Structure Granulation Quality: Red Fascia Exposed: No Necrotic Amount: None Present (0%) Fat Layer (Subcutaneous Tissue) Exposed: Yes Tendon Exposed: No Muscle Exposed: No Joint Exposed: No Bone Exposed: No Treatment Notes Wound #11 (Foot) Cleanser Soap and Water Discharge Instruction: May shower and wash wound with  dial antibacterial soap and water prior to dressing change. Wound Cleanser Discharge Instruction: Cleanse the wound with wound cleanser prior to applying a clean dressing using gauze sponges, not tissue or cotton balls. Peri-Wound Care Topical Primary Dressing KerraCel Ag Gelling Fiber Dressing, 2x2 in (silver alginate) Discharge Instruction: Apply silver alginate to wound bed as instructed Secondary Dressing ALLEVYN Gentle Border, 4x4 (in/in) Discharge Instruction: Apply over primary dressing as directed. Secured With Compression Wrap Compression Stockings Environmental education officer) Signed: 06/30/2022 5:20:13 PM By: [name] Signed: 07/03/2022 4:36:07 PM By: [name] Entered By: [name] on 06/30/2022 14:42:45 -------------------------------------------------------------------------------- Wound Assessment Details Patient Name: Date of Service: [name], [name] SE [name] M. 06/30/2022 3:00 PM Medical Record Number: [id number] Patient Account Number: [id number] Date of Birth/Sex: Treating RN: 03-05-1951 (71 y.o. [name] Primary Care Roselinda Bahena: [name] Other Clinician: Referring Priyansh Pry: Treating Abiageal Blowe/Extender: [name] Weeks in Treatment: 12 Wound Status Wound Number: 2 Primary Diabetic Wound/Ulcer of the Lower Extremity Etiology: Wound Location: Right, Posterior Calcaneus Wound Open Wound Open Wounding Event: Gradually Appeared Status: Date Acquired: 01/25/2022 Comorbid Cataracts, Anemia, Sleep Apnea, Congestive Heart Failure, Weeks Of Treatment: 12 History: Hypertension, Type I Diabetes, Received Radiation Clustered Wound: No Photos Wound Measurements Length: (cm) Width: (cm) Depth: (cm) Area: (cm) Volume: (cm) 0 % Reduction in Area: 100% 0 % Reduction in Volume: 100% 0 Epithelialization: Large (67-100%) 0 0 Wound Description Classification: Grade 2 Wound Margin: Distinct, outline  attached Exudate Amount: None Present Foul Odor After Cleansing: No Slough/Fibrino No Wound Bed Granulation Amount: Small (1-33%) Exposed Structure Granulation Quality: Red, Pink Fascia Exposed: No Necrotic Amount: Large (67-100%) Fat Layer (Subcutaneous Tissue) Exposed: Yes Necrotic Quality: Eschar Tendon Exposed: No Muscle Exposed: No Joint Exposed: No Bone  Exposed: No Electronic Signature(s) Signed: 06/30/2022 5:20:13 PM By: [name] Signed: 07/03/2022 4:36:07 PM By: [name] Entered By: [name] on 06/30/2022 14:43:17 -------------------------------------------------------------------------------- Vitals Details Patient Name: Date of Service: [name], [name] SE [name] M. 06/30/2022 3:00 PM Medical Record Number: [id number] Patient Account Number: [id number] Date of Birth/Sex: Treating RN: Aug 04, 1951 (71 y.o. [name] Primary Care Zephyra Bernardi: [name] Other Clinician: Referring Oasis Goehring: Treating Ukiah Trawick/Extender: [name] in Treatment: 12 Vital Signs Time Taken: 14:33 Temperature (F): 97.9 Height (in): 67 Pulse (bpm): 56 Weight (lbs): 243 Respiratory Rate (breaths/min): 20 Body Mass Index (BMI): 38.1 Blood Pressure (mmHg): 162/77 Capillary Blood Glucose (mg/dl): 125 Reference Range: 80 - 120 mg / dl Electronic Signature(s) Signed: 07/03/2022 4:36:07 PM By: [name] Signed: 07/03/2022 4:36:07 PM By: [name] Entered By: [name] on 06/30/2022 14:34:27

## 2022-07-04 ENCOUNTER — Ambulatory Visit (INDEPENDENT_AMBULATORY_CARE_PROVIDER_SITE_OTHER): Payer: Medicare PPO | Admitting: Podiatry

## 2022-07-04 DIAGNOSIS — Z7901 Long term (current) use of anticoagulants: Secondary | ICD-10-CM

## 2022-07-04 DIAGNOSIS — E1159 Type 2 diabetes mellitus with other circulatory complications: Secondary | ICD-10-CM

## 2022-07-04 DIAGNOSIS — M79675 Pain in left toe(s): Secondary | ICD-10-CM

## 2022-07-04 DIAGNOSIS — B351 Tinea unguium: Secondary | ICD-10-CM

## 2022-07-04 DIAGNOSIS — M2030 Hallux varus (acquired), unspecified foot: Secondary | ICD-10-CM

## 2022-07-04 DIAGNOSIS — M79674 Pain in right toe(s): Secondary | ICD-10-CM | POA: Diagnosis not present

## 2022-07-04 DIAGNOSIS — D689 Coagulation defect, unspecified: Secondary | ICD-10-CM

## 2022-07-04 NOTE — Progress Notes (Signed)
This patient returns to my office for at risk foot care.  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  This patient is unable to cut nails herself  since the patient cannot reach her  nails.These nails are painful walking and wearing shoes.  Patient presents in a wheelchair .  This patient presents for at risk foot care today.  General Appearance  Alert, conversant and in no acute stress.  Vascular  Dorsalis pedis and posterior tibial  pulses are weakly  palpable  bilaterally.  Capillary return is within normal limits  bilaterally. Temperature is within normal limits  bilaterally.  Neurologic  Senn-Weinstein monofilament wire diminished  bilaterally. Muscle power within normal limits bilaterally.  Nails Thick disfigured discolored nails with subungual debris  from hallux to fifth toes bilaterally. No evidence of bacterial infection or drainage bilaterally.  Orthopedic  No limitations of motion  feet .  No crepitus or effusions noted.  No bony pathology or digital deformities noted.  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  Hallux  malleus  B/L  Skin  normotropic skin with no porokeratosis noted bilaterally.  Skin lesion on dorsum of hallux malleus  B/L.  Ulcer left foot treated by wound center.   Onychomycosis  Pain in right toes  Pain in left toes  Consent was obtained for treatment procedures.   Mechanical debridement of nails 1-5  bilaterally performed with a nail nipper.  Filed with dremel without incident. No infection or ulcer.     Return office visit    3 months       Told patient to return for periodic foot care and evaluation due to potential at risk complications.   Gardiner Barefoot DPM

## 2022-07-06 ENCOUNTER — Ambulatory Visit (INDEPENDENT_AMBULATORY_CARE_PROVIDER_SITE_OTHER): Payer: Medicare PPO

## 2022-07-06 ENCOUNTER — Telehealth: Payer: Self-pay

## 2022-07-06 DIAGNOSIS — I442 Atrioventricular block, complete: Secondary | ICD-10-CM | POA: Diagnosis not present

## 2022-07-06 NOTE — Telephone Encounter (Signed)
Thanks!  Appt switched.  Pt very appreciative.  Christen Bame, CMA

## 2022-07-06 NOTE — Telephone Encounter (Signed)
Patient calls nurse line requesting refill on Tramadol. Patient states that she needs pain medication to help with wound care visit.  This is not on patient's current medication list.   Advised patient that she would need to schedule appointment to discuss prescribing this medication.   I scheduled patient on Tuesday afternoon. Appointment needed to be scheduled prior to wound care visit on 7/19.   Patient asks that refill request still be sent to PCP to see if appointment is "actually needed"  Please advise.   Talbot Grumbling, RN

## 2022-07-06 NOTE — Telephone Encounter (Signed)
Pt reports that it cost her "a lot of money she does not have to get scat transportation" to come in.  She wants to know if she can do a virtual visit instead? Christen Bame, CMA

## 2022-07-07 ENCOUNTER — Other Ambulatory Visit: Payer: Self-pay

## 2022-07-07 ENCOUNTER — Other Ambulatory Visit: Payer: Self-pay | Admitting: Student

## 2022-07-07 ENCOUNTER — Other Ambulatory Visit: Payer: Self-pay | Admitting: Adult Health

## 2022-07-07 DIAGNOSIS — K5901 Slow transit constipation: Secondary | ICD-10-CM

## 2022-07-07 LAB — CUP PACEART REMOTE DEVICE CHECK
Battery Remaining Longevity: 117 mo
Battery Voltage: 3.01 V
Brady Statistic AP VP Percent: 54.24 %
Brady Statistic AP VS Percent: 2 %
Brady Statistic AS VP Percent: 40.62 %
Brady Statistic AS VS Percent: 3.06 %
Brady Statistic RA Percent Paced: 20.18 %
Brady Statistic RV Percent Paced: 86.12 %
Date Time Interrogation Session: 20230713051811
Implantable Lead Implant Date: 20210702
Implantable Lead Implant Date: 20210702
Implantable Lead Location: 753859
Implantable Lead Location: 753860
Implantable Lead Model: 5076
Implantable Lead Model: 5076
Implantable Pulse Generator Implant Date: 20210702
Lead Channel Impedance Value: 266 Ohm
Lead Channel Impedance Value: 323 Ohm
Lead Channel Impedance Value: 361 Ohm
Lead Channel Impedance Value: 399 Ohm
Lead Channel Pacing Threshold Amplitude: 0.5 V
Lead Channel Pacing Threshold Amplitude: 0.625 V
Lead Channel Pacing Threshold Pulse Width: 0.4 ms
Lead Channel Pacing Threshold Pulse Width: 0.4 ms
Lead Channel Sensing Intrinsic Amplitude: 11 mV
Lead Channel Sensing Intrinsic Amplitude: 11 mV
Lead Channel Sensing Intrinsic Amplitude: 4.75 mV
Lead Channel Sensing Intrinsic Amplitude: 4.75 mV
Lead Channel Setting Pacing Amplitude: 1.5 V
Lead Channel Setting Pacing Amplitude: 2.5 V
Lead Channel Setting Pacing Pulse Width: 0.4 ms
Lead Channel Setting Sensing Sensitivity: 1.2 mV

## 2022-07-07 MED ORDER — METOPROLOL SUCCINATE ER 50 MG PO TB24
50.0000 mg | ORAL_TABLET | Freq: Every day | ORAL | 0 refills | Status: DC
Start: 2022-07-07 — End: 2023-01-16
  Filled 2022-07-07: qty 30, 30d supply, fill #0

## 2022-07-11 ENCOUNTER — Telehealth (INDEPENDENT_AMBULATORY_CARE_PROVIDER_SITE_OTHER): Payer: Medicare PPO | Admitting: Family Medicine

## 2022-07-11 DIAGNOSIS — T148XXA Other injury of unspecified body region, initial encounter: Secondary | ICD-10-CM | POA: Diagnosis not present

## 2022-07-11 DIAGNOSIS — R52 Pain, unspecified: Secondary | ICD-10-CM | POA: Diagnosis not present

## 2022-07-11 MED ORDER — TRAMADOL HCL 50 MG PO TABS: 50.0000 mg | ORAL_TABLET | Freq: Four times a day (QID) | ORAL | 0 refills | Status: DC | PRN

## 2022-07-11 NOTE — Assessment & Plan Note (Addendum)
Precepted patient with Dr. Andria Frames who was agreeable to giving patient a short course of tramadol for wound care. PDMP reviewed. I have prescribed pt 10 tablets of 50 mg tramadol.  Recommended that this is a controlled substance and ideally not a long-term medication. If she is still having pain after her wound care is finished she should come into the clinic for evaluation and be seen by her PCP Dr Joelyn Oms.  Patient expressed understanding and is happy with the plan.

## 2022-07-11 NOTE — Progress Notes (Addendum)
[facility] Telemedicine Visit  Patient consented to have virtual visit and was identified by name and date of birth. Method of visit: Telephone  Encounter participants: Patient: [name] - located at home Provider: Lattie Haw - located at [facility]  Chief Complaint: wound care  HPI:  Pain medication Pt is requesting pain medication for wound care for diabetic foot ulcers. She has wound care every week. She has wounds on the left foot.  She is taking tylenol which is not helping the pain. She has taken tramadol in the hospital past which she responded well to.  ROS: per HPI  Pertinent PMHx: left pontine stroke, T1DM, OSA, HTN, HFrEF  Exam:  There were no vitals taken for this visit.  Respiratory: speaking in the full sentences  Assessment/Plan:  Pain associated with wound Precepted patient with Dr. Andria Frames who was agreeable to giving patient a short course of tramadol for wound care. PDMP reviewed. I have prescribed pt 10 tablets of 50 mg tramadol.  Recommended that this is a controlled substance and ideally not a long-term medication. If she is still having pain after her wound care is finished she should come into the clinic for evaluation and be seen by her PCP Dr Joelyn Oms.  Patient expressed understanding and is happy with the plan.    Time spent during visit with patient: 5 minutes

## 2022-07-12 ENCOUNTER — Encounter (HOSPITAL_BASED_OUTPATIENT_CLINIC_OR_DEPARTMENT_OTHER): Payer: Medicare PPO | Admitting: General Surgery

## 2022-07-12 DIAGNOSIS — L97422 Non-pressure chronic ulcer of left heel and midfoot with fat layer exposed: Secondary | ICD-10-CM | POA: Diagnosis not present

## 2022-07-12 DIAGNOSIS — I5032 Chronic diastolic (congestive) heart failure: Secondary | ICD-10-CM | POA: Diagnosis not present

## 2022-07-12 DIAGNOSIS — E1042 Type 1 diabetes mellitus with diabetic polyneuropathy: Secondary | ICD-10-CM | POA: Diagnosis not present

## 2022-07-12 DIAGNOSIS — L97522 Non-pressure chronic ulcer of other part of left foot with fat layer exposed: Secondary | ICD-10-CM | POA: Diagnosis not present

## 2022-07-12 DIAGNOSIS — E10621 Type 1 diabetes mellitus with foot ulcer: Secondary | ICD-10-CM | POA: Diagnosis not present

## 2022-07-12 DIAGNOSIS — I890 Lymphedema, not elsewhere classified: Secondary | ICD-10-CM | POA: Diagnosis not present

## 2022-07-12 DIAGNOSIS — E1059 Type 1 diabetes mellitus with other circulatory complications: Secondary | ICD-10-CM | POA: Diagnosis not present

## 2022-07-12 DIAGNOSIS — Z6841 Body Mass Index (BMI) 40.0 and over, adult: Secondary | ICD-10-CM | POA: Diagnosis not present

## 2022-07-12 DIAGNOSIS — L97512 Non-pressure chronic ulcer of other part of right foot with fat layer exposed: Secondary | ICD-10-CM | POA: Diagnosis not present

## 2022-07-12 DIAGNOSIS — E10622 Type 1 diabetes mellitus with other skin ulcer: Secondary | ICD-10-CM | POA: Diagnosis not present

## 2022-07-12 DIAGNOSIS — L89892 Pressure ulcer of other site, stage 2: Secondary | ICD-10-CM | POA: Diagnosis not present

## 2022-07-12 DIAGNOSIS — I872 Venous insufficiency (chronic) (peripheral): Secondary | ICD-10-CM | POA: Diagnosis not present

## 2022-07-12 NOTE — Progress Notes (Signed)
Saathoff, [name] [name] ([id number]) Visit Report for 07/12/2022 Chief Complaint Document Details Patient Name: Date of Service: [name] [name]. 07/12/2022 3:45 PM Medical Record Number: [id number] Patient Account Number: [id number] Date of Birth/Sex: Treating RN: Sep 08, 1951 (71 y.o. [name] [name] Primary Care Provider: Jim Like Other Clinician: Referring Provider: Treating Provider/Extender: Rory Percy in Treatment: 14 Information Obtained from: Patient Chief Complaint Patient presents for treatment of open ulcers due to venous insufficiency in the setting of poorly controlled DM1 and CHF Electronic Signature(s) Signed: 07/12/2022 4:27:03 PM By: Fredirick Maudlin MD FACS Entered By: Fredirick Maudlin on 07/12/2022 16:27:02 -------------------------------------------------------------------------------- Debridement Details Patient Name: Date of Service: [name] [name], [name] [name] [name] 07/12/2022 3:45 PM Medical Record Number: [id number] Patient Account Number: [id number] Date of Birth/Sex: Treating RN: 1951/07/16 (71 y.o. [name] [name] Primary Care Provider: Jim Like Other Clinician: Referring Provider: Treating Provider/Extender: Rory Percy in Treatment: 14 Debridement Performed for Assessment: Wound #12 Right T Great oe Performed By: Physician Fredirick Maudlin, MD Debridement Type: Debridement Level of Consciousness (Pre-procedure): Awake and Alert Pre-procedure Verification/Time Out Yes - 16:22 Taken: Start Time: 16:22 Pain Control: Lidocaine 4% T opical Solution T Area Debrided (L x W): otal 0.6 (cm) x 0.7 (cm) = 0.42 (cm) Tissue and other material debrided: Non-Viable, Slough, Slough Level: Non-Viable Tissue Debridement Description: Selective/Open Wound Instrument: Curette Bleeding: Minimum Hemostasis Achieved: Pressure Procedural Pain: 0 Post Procedural Pain: 0 Response to Treatment: Procedure  was tolerated well Level of Consciousness (Post- Awake and Alert procedure): Post Debridement Measurements of Total Wound Length: (cm) 0.6 Stage: Category/Stage II Width: (cm) 0.7 Depth: (cm) 0.1 Volume: (cm) 0.033 Character of Wound/Ulcer Post Debridement: Improved Post Procedure Diagnosis Same as Pre-procedure Electronic Signature(s) Signed: 07/12/2022 4:48:40 PM By: Fredirick Maudlin MD FACS Signed: 07/12/2022 5:01:06 PM By: Adline Peals Entered By: Adline Peals on 07/12/2022 16:23:31 -------------------------------------------------------------------------------- Debridement Details Patient Name: Date of Service: [name] [name], [name] [name] [name] 07/12/2022 3:45 PM Medical Record Number: [id number] Patient Account Number: [id number] Date of Birth/Sex: Treating RN: [birth year] (71 y.o. [name] [name] Primary Care Provider: Jim Like Other Clinician: Referring Provider: Treating Provider/Extender: Rory Percy in Treatment: 14 Debridement Performed for Assessment: Wound #11 Foot Performed By: Physician Fredirick Maudlin, MD Debridement Type: Debridement Severity of Tissue Pre Debridement: Fat layer exposed Level of Consciousness (Pre-procedure): Awake and Alert Pre-procedure Verification/Time Out Yes - 16:22 Taken: Start Time: 16:22 Pain Control: Lidocaine 4% T opical Solution T Area Debrided (L x W): otal 1.1 (cm) x 0.8 (cm) = 0.88 (cm) Tissue and other material debrided: Non-Viable, Slough, Slough Level: Non-Viable Tissue Debridement Description: Selective/Open Wound Instrument: Curette Bleeding: Minimum Hemostasis Achieved: Pressure Procedural Pain: 0 Post Procedural Pain: 0 Response to Treatment: Procedure was tolerated well Level of Consciousness (Post- Awake and Alert procedure): Post Debridement Measurements of Total Wound Length: (cm) 1.1 Width: (cm) 0.8 Depth: (cm) 0.1 Volume: (cm) 0.069 Character of Wound/Ulcer Post  Debridement: Improved Severity of Tissue Post Debridement: Fat layer exposed Post Procedure Diagnosis Same as Pre-procedure Electronic Signature(s) Signed: 07/12/2022 4:48:40 PM By: Fredirick Maudlin MD FACS Signed: 07/12/2022 5:01:06 PM By: Adline Peals Entered By: Adline Peals on 07/12/2022 16:23:57 -------------------------------------------------------------------------------- HPI Details Patient Name: Date of Service: [name] [name], [name] [name] [name] 07/12/2022 3:45 PM Medical Record Number: [id number] Patient Account Number: [id number] Date of Birth/Sex: Treating RN: 1951-07-18 (71 y.o. [name] [name] Primary Care Provider: Jim Like Other Clinician: Referring Provider: Treating Provider/Extender: Rory Percy in  Treatment: 14 History of Present Illness HPI Description: ADMISSION 04/03/2022 This is a 71 year old woman with a past medical history notable for type 1 diabetes mellitus, poorly controlled with her most recent A1c being 8.3, congestive heart failure with recent admission for exacerbation secondary to noncompliance with diuretics, as well as history of pontine stroke and oxygen dependence. She has wounds on her bilateral lower extremities. She says they have been present for about 2 months and initially started as blisters. She says that she has compression stockings, but does not wear them because they are too difficult to get on. ABIs done in clinic were 1.4 and 1.5, suggesting some degree of noncompressibility. She also has a wound on her right heel that she says is secondary to it getting bumped frequently as she transfers. She came to clinic with just dry dressings on her legs. She does have home health care, as well as her sister that have been assisting her with her dressings. She has multiple wounds. On the left medial calf, just above her ankle, there is a somewhat geographic wound with slough and some epithelialization. On  the anterior tibial surface and lateral calf, the wounds are quite desiccated with yellow eschar present. On the right anterior tibial surface, the wounds are gritty with thin slough and eschar. The posterior right ankle wound is just above the calcaneus overlying the Achilles and has thick yellow-gray slough and a bit of odor. No purulent drainage appreciated. 04/10/2022: The wounds on her left leg are closed. On the right, the anterior tibial surface wounds are epithelializing and much cleaner. There is just a layer of biofilm on the open areas. On the posterior right ankle wound, there is a decreased volume of slough and no odor. We have been using Hydrofera Blue to all sites under compression. 04/17/2022: The wounds on her left leg remained closed and she did bring her juxta lite stockings with her today. On the right, the more distal wound is closed and the proximal wound has epithelialized significantly since her last visit. At the posterior right ankle, she still has accumulated some slough and some periwound callus but there is no odor or significant drainage. 04/24/2022: The right anterior leg wounds are closed. She still has a wound on her posterior right ankle that looks about the same as last week. She did not bring her right sided juxta light with her today but is wearing the left sided stocking. 05/01/2022: She has a tiny abrasion on her left anterior tibia which we did not add to her wound count today. The wound on her posterior right ankle is a little bit smaller with a little accumulated slough. No drainage or concern for infection. 05/08/2022: She continues to forget to bring her juxta lite stocking with her so she remains in a compression wrap despite healing of the right anterior leg wounds. The wound on her posterior right ankle/calcaneus is a bit smaller today with just a little bit of accumulated slough. She is wearing a juxta lite stocking on the left leg. 05/15/2022: She says that  she has been wearing her juxta lite stockings at home, but today she is wearing regular compression stockings that she purchased from [employer]; the juxta lite stockings are in the laundry. The wound on her posterior right ankle/calcaneus continues to contract and has just a little bit of accumulated slough. Unfortunately, her edema control is suboptimal on the left and she has developed several small blisters with a larger 1 at the medial calf.  05/23/2022: The patient has been wearing her juxta lite stockings. Unfortunately, she has developed a large blister on her right lower leg just above the ankle. The skin overlying it is still intact. The blister that had opened on her left medial calf is smaller; and the other small blistered sites are closed. The posterior right ankle/calcaneus is about the same today. 06/07/2022: The only remaining open wound is on her posterior right calcaneus. The majority of this is covered with eschar. 06/30/2022: I am not sure why the patient did not return to clinic until today, but the ulcer on her right heel has healed. Unfortunately, she has developed a new ulcer on her left lateral foot. From the position of it, it looks as though the outward rotation of her lower leg at rest is probably causing the lateral midfoot to rub or press against the metal leg rests of her wheelchair. She is not padding the chair when she rests her leg on the leg rests. The new ulcer is fairly superficial with just the fat layer exposed. It is quite tender. No erythema, induration, or purulent drainage identified. 07/12/2022: After our last visit, she was seen by podiatry. Nail care was performed. For some reason he had her apply silicone toe covers on her bilateral great toes. As result, the skin is now very macerated and she has a new ulcer on the knuckle of her right great toe. It is fairly small but does expose the fat layer. No erythema induration or purulent drainage although there was some  odor on her feet when the caps were removed. The wound on her left lateral foot is smaller with just a small amount of slough buildup. Her Prevalon boots arrived and we will put her in those after her dressing is changed. Electronic Signature(s) Signed: 07/12/2022 4:36:28 PM By: Fredirick Maudlin MD FACS Entered By: Fredirick Maudlin on 07/12/2022 16:36:27 -------------------------------------------------------------------------------- Physical Exam Details Patient Name: Date of Service: [name] [name], [name] [name] [name] 07/12/2022 3:45 PM Medical Record Number: [id number] Patient Account Number: [id number] Date of Birth/Sex: Treating RN: [birth year] (71 y.o. [name] [name] Primary Care Provider: Jim Like Other Clinician: Referring Provider: Treating Provider/Extender: Rory Percy in Treatment: 14 Constitutional . . . . No acute distress.Marland Kitchen Respiratory Normal work of breathing on room air.. Notes 07/12/2022: She has a new ulcer on the knuckle of her right great toe. It is fairly small but does expose the fat layer. No erythema induration or purulent drainage although there was some odor on her feet when the caps were removed. The wound on her left lateral foot is smaller with just a small amount of slough buildup. Electronic Signature(s) Signed: 07/12/2022 4:37:52 PM By: Fredirick Maudlin MD FACS Entered By: Fredirick Maudlin on 07/12/2022 16:37:52 -------------------------------------------------------------------------------- Physician Orders Details Patient Name: Date of Service: [name] [name], [name] [name] [name] 07/12/2022 3:45 PM Medical Record Number: [id number] Patient Account Number: [id number] Date of Birth/Sex: Treating RN: [birth year] (71 y.o. [name] [name] Primary Care Provider: Jim Like Other Clinician: Referring Provider: Treating Provider/Extender: Rory Percy in Treatment: [phone] Verbal / Phone Orders: No Diagnosis  Coding ICD-10 Coding Code Description I89.0 Lymphedema, not elsewhere classified Q67.61 Chronic diastolic (congestive) heart failure E10.59 Type 1 diabetes mellitus with other circulatory complications P50.93 Type 1 diabetes mellitus with diabetic polyneuropathy Z68.41 Body mass index [BMI]40.0-44.9, adult E10.622 Type 1 diabetes mellitus with other skin ulcer L97.422 Non-pressure chronic ulcer of left heel and midfoot with fat layer exposed L97.512 Non-pressure chronic  ulcer of other part of right foot with fat layer exposed Follow-up Appointments Return appointment in 3 weeks. - Dr. Celine Ahr - Room 2 - 8/9 at 3:45 PM Bathing/ Shower/ Hygiene May shower and wash wound with soap and water. - with dressing changes Edema Control - Lymphedema / SCD / Other Bilateral Lower Extremities Elevate legs to the level of the heart or above for 30 minutes daily and/or when sitting, a frequency of: - throughout the day Patient to wear own compression stockings every day. - juxtalites to both legs Exercise regularly Off-Loading Other: - Prevalon boot to both feet. Please put pillow between foot and foot rest so the foot is not resting on the metal footrest. Home Health Admit to [facility] for wound care. May utilize formulary equivalent dressing for wound treatment orders unless otherwise specified. New wound care orders this week; continue Home Health for wound care. May utilize formulary equivalent dressing for wound treatment orders unless otherwise specified. Other Home Health Orders/Instructions: [facility] Wound Treatment Wound #11 - Foot Cleanser: Soap and Water Every Other Day/15 Days Discharge Instructions: May shower and wash wound with dial antibacterial soap and water prior to dressing change. Cleanser: Wound Cleanser Every Other Day/15 Days Discharge Instructions: Cleanse the wound with wound cleanser prior to applying a clean dressing using gauze sponges, not tissue or cotton  balls. Prim Dressing: KerraCel Ag Gelling Fiber Dressing, 2x2 in (silver alginate) (Generic) Every Other Day/15 Days ary Discharge Instructions: Apply silver alginate to wound bed as instructed Secondary Dressing: ALLEVYN Gentle Border, 4x4 (in/in) (Generic) Every Other Day/15 Days Discharge Instructions: Apply over primary dressing as directed. Wound #12 - T Great oe Wound Laterality: Right Cleanser: Soap and Water Every Other Day/15 Days Discharge Instructions: May shower and wash wound with dial antibacterial soap and water prior to dressing change. Cleanser: Wound Cleanser Every Other Day/15 Days Discharge Instructions: Cleanse the wound with wound cleanser prior to applying a clean dressing using gauze sponges, not tissue or cotton balls. Prim Dressing: KerraCel Ag Gelling Fiber Dressing, 2x2 in (silver alginate) (Generic) Every Other Day/15 Days ary Discharge Instructions: Apply silver alginate to wound bed as instructed Secondary Dressing: Woven Gauze Sponges 2x2 in Every Other Day/15 Days Discharge Instructions: Apply over primary dressing as directed. Secured With: Child psychotherapist, Sterile 2x75 (in/in) Every Other Day/15 Days Discharge Instructions: Secure with stretch gauze as directed. Electronic Signature(s) Signed: 07/12/2022 4:48:40 PM By: Fredirick Maudlin MD FACS Entered By: Fredirick Maudlin on 07/12/2022 16:41:11 -------------------------------------------------------------------------------- Problem List Details Patient Name: Date of Service: [name] [name], [name] [name] [name] 07/12/2022 3:45 PM Medical Record Number: [id number] Patient Account Number: [id number] Date of Birth/Sex: Treating RN: July 18, 1951 (71 y.o. [name] [name] Primary Care Provider: Jim Like Other Clinician: Referring Provider: Treating Provider/Extender: Rory Percy in Treatment: 14 Active Problems ICD-10 Encounter Code Description Active Date  MDM Diagnosis I89.0 Lymphedema, not elsewhere classified 04/03/2022 No Yes M57.84 Chronic diastolic (congestive) heart failure 04/03/2022 No Yes E10.59 Type 1 diabetes mellitus with other circulatory complications 6/96/2952 No Yes E10.42 Type 1 diabetes mellitus with diabetic polyneuropathy 04/03/2022 No Yes Z68.41 Body mass index [BMI]40.0-44.9, adult 04/03/2022 No Yes E10.622 Type 1 diabetes mellitus with other skin ulcer 04/03/2022 No Yes L97.422 Non-pressure chronic ulcer of left heel and midfoot with fat layer exposed 06/30/2022 No Yes L97.512 Non-pressure chronic ulcer of other part of right foot with fat layer exposed 07/12/2022 No Yes Inactive Problems ICD-10 Code Description Active Date Inactive Date L97.212 Non-pressure chronic  ulcer of right calf with fat layer exposed 04/03/2022 04/03/2022 S17.793 Non-pressure chronic ulcer of left calf with fat layer exposed 04/03/2022 04/03/2022 L97.312 Non-pressure chronic ulcer of right ankle with fat layer exposed 04/03/2022 04/03/2022 L97.821 Non-pressure chronic ulcer of other part of left lower leg limited to breakdown of skin 05/15/2022 05/15/2022 L97.811 Non-pressure chronic ulcer of other part of right lower leg limited to breakdown of skin 05/23/2022 05/23/2022 Resolved Problems Electronic Signature(s) Signed: 07/12/2022 4:26:37 PM By: Fredirick Maudlin MD FACS Entered By: Fredirick Maudlin on 07/12/2022 16:26:37 -------------------------------------------------------------------------------- Progress Note Details Patient Name: Date of Service: [name] [name], [name] [name] [name] 07/12/2022 3:45 PM Medical Record Number: [id number] Patient Account Number: [id number] Date of Birth/Sex: Treating RN: [birth year] (71 y.o. [name] [name] Primary Care Provider: Jim Like Other Clinician: Referring Provider: Treating Provider/Extender: Rory Percy in Treatment: 14 Subjective Chief Complaint Information obtained from  Patient Patient presents for treatment of open ulcers due to venous insufficiency in the setting of poorly controlled DM1 and CHF History of Present Illness (HPI) ADMISSION 04/03/2022 This is a 71 year old woman with a past medical history notable for type 1 diabetes mellitus, poorly controlled with her most recent A1c being 8.3, congestive heart failure with recent admission for exacerbation secondary to noncompliance with diuretics, as well as history of pontine stroke and oxygen dependence. She has wounds on her bilateral lower extremities. She says they have been present for about 2 months and initially started as blisters. She says that she has compression stockings, but does not wear them because they are too difficult to get on. ABIs done in clinic were 1.4 and 1.5, suggesting some degree of noncompressibility. She also has a wound on her right heel that she says is secondary to it getting bumped frequently as she transfers. She came to clinic with just dry dressings on her legs. She does have home health care, as well as her sister that have been assisting her with her dressings. She has multiple wounds. On the left medial calf, just above her ankle, there is a somewhat geographic wound with slough and some epithelialization. On the anterior tibial surface and lateral calf, the wounds are quite desiccated with yellow eschar present. On the right anterior tibial surface, the wounds are gritty with thin slough and eschar. The posterior right ankle wound is just above the calcaneus overlying the Achilles and has thick yellow-gray slough and a bit of odor. No purulent drainage appreciated. 04/10/2022: The wounds on her left leg are closed. On the right, the anterior tibial surface wounds are epithelializing and much cleaner. There is just a layer of biofilm on the open areas. On the posterior right ankle wound, there is a decreased volume of slough and no odor. We have been using Hydrofera Blue to  all sites under compression. 04/17/2022: The wounds on her left leg remained closed and she did bring her juxta lite stockings with her today. On the right, the more distal wound is closed and the proximal wound has epithelialized significantly since her last visit. At the posterior right ankle, she still has accumulated some slough and some periwound callus but there is no odor or significant drainage. 04/24/2022: The right anterior leg wounds are closed. She still has a wound on her posterior right ankle that looks about the same as last week. She did not bring her right sided juxta light with her today but is wearing the left sided stocking. 05/01/2022: She has a tiny abrasion on her left anterior  tibia which we did not add to her wound count today. The wound on her posterior right ankle is a little bit smaller with a little accumulated slough. No drainage or concern for infection. 05/08/2022: She continues to forget to bring her juxta lite stocking with her so she remains in a compression wrap despite healing of the right anterior leg wounds. The wound on her posterior right ankle/calcaneus is a bit smaller today with just a little bit of accumulated slough. She is wearing a juxta lite stocking on the left leg. 05/15/2022: She says that she has been wearing her juxta lite stockings at home, but today she is wearing regular compression stockings that she purchased from [employer]; the juxta lite stockings are in the laundry. The wound on her posterior right ankle/calcaneus continues to contract and has just a little bit of accumulated slough. Unfortunately, her edema control is suboptimal on the left and she has developed several small blisters with a larger 1 at the medial calf. 05/23/2022: The patient has been wearing her juxta lite stockings. Unfortunately, she has developed a large blister on her right lower leg just above the ankle. The skin overlying it is still intact. The blister that had opened on  her left medial calf is smaller; and the other small blistered sites are closed. The posterior right ankle/calcaneus is about the same today. 06/07/2022: The only remaining open wound is on her posterior right calcaneus. The majority of this is covered with eschar. 06/30/2022: I am not sure why the patient did not return to clinic until today, but the ulcer on her right heel has healed. Unfortunately, she has developed a new ulcer on her left lateral foot. From the position of it, it looks as though the outward rotation of her lower leg at rest is probably causing the lateral midfoot to rub or press against the metal leg rests of her wheelchair. She is not padding the chair when she rests her leg on the leg rests. The new ulcer is fairly superficial with just the fat layer exposed. It is quite tender. No erythema, induration, or purulent drainage identified. 07/12/2022: After our last visit, she was seen by podiatry. Nail care was performed. For some reason he had her apply silicone toe covers on her bilateral great toes. As result, the skin is now very macerated and she has a new ulcer on the knuckle of her right great toe. It is fairly small but does expose the fat layer. No erythema induration or purulent drainage although there was some odor on her feet when the caps were removed. The wound on her left lateral foot is smaller with just a small amount of slough buildup. Her Prevalon boots arrived and we will put her in those after her dressing is changed. Patient History Information obtained from Patient. Family History Heart Disease - Mother, Hypertension - Mother,Father, Stroke - Child. Social History Never smoker, Marital Status - Divorced, Alcohol Use - Never, Drug Use - No History, Caffeine Use - Never. Medical History Eyes Patient has history of Cataracts Hematologic/Lymphatic Patient has history of Anemia Respiratory Patient has history of Sleep Apnea Cardiovascular Patient has history  of Congestive Heart Failure, Hypertension Endocrine Patient has history of Type I Diabetes Oncologic Patient has history of Received Radiation Hospitalization/Surgery History - pacemaker implant. - loop recorder insertion and removal. - esophagogastroduodenoscopy. - TEE without cardioversion. - colonoscopy. - left and right heart catheterization. - abdominal angiogram. - thyroidectomy. - abdominal hysterectomy. - ankle fracture surgery. Medical  A Surgical History Notes nd Ear/Nose/Mouth/Throat edentulous, GERD, seasonal allergies, trouble swallowing Hematologic/Lymphatic hyperlipidemia Cardiovascular complete heart block, bradycardia, paroxysmal atrial fibrillation, pacemaker Gastrointestinal hemorrhoids Endocrine hypothyroidism Genitourinary proteinuria, frequent UTIs Musculoskeletal decreased ROM L shoulder, hemiparesis affecting L side, arthritis Neurologic benign paroxysmal positional vertigo, intracranial vascular stenosis, lacunar infarction, stroke Oncologic thyroid cancer Psychiatric depression Objective Constitutional No acute distress.. Vitals Time Taken: 4:09 PM, Height: 67 in, Weight: 243 lbs, BMI: 38.1, Temperature: 97.8 F, Pulse: 62 bpm, Respiratory Rate: 20 breaths/min, Blood Pressure: 140/52 mmHg. Respiratory Normal work of breathing on room air.. General Notes: 07/12/2022: She has a new ulcer on the knuckle of her right great toe. It is fairly small but does expose the fat layer. No erythema induration or purulent drainage although there was some odor on her feet when the caps were removed. The wound on her left lateral foot is smaller with just a small amount of slough buildup. Integumentary (Hair, Skin) Wound #11 status is Open. Original cause of wound was Pressure Injury. The date acquired was: 06/16/2022. The wound has been in treatment 1 weeks. The wound is located on the Foot. The wound measures 1.1cm length x 0.8cm width x 0.1cm depth; 0.691cm^2 area  and 0.069cm^3 volume. There is Fat Layer (Subcutaneous Tissue) exposed. There is no tunneling or undermining noted. There is a medium amount of serosanguineous drainage noted. The wound margin is distinct with the outline attached to the wound base. There is medium (34-66%) red granulation within the wound bed. There is a medium (34-66%) amount of necrotic tissue within the wound bed including Adherent Slough. Wound #12 status is Open. Original cause of wound was Pressure Injury. The date acquired was: 07/12/2022. The wound is located on the Right T Great. The oe wound measures 0.6cm length x 0.7cm width x 0.1cm depth; 0.33cm^2 area and 0.033cm^3 volume. There is Fat Layer (Subcutaneous Tissue) exposed. There is no tunneling or undermining noted. There is a medium amount of serosanguineous drainage noted. The wound margin is distinct with the outline attached to the wound base. There is large (67-100%) red granulation within the wound bed. There is a small (1-33%) amount of necrotic tissue within the wound bed including Adherent Slough. 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 There was a Selective/Open Wound Non-Viable Tissue Debridement with a total area of 0.88 sq cm performed by Fredirick Maudlin, MD. With the following instrument(s): Curette to remove Non-Viable tissue/material.  Material removed includes [facility] after achieving pain control using Lidocaine 4% Topical Solution. No specimens were taken. A time out was conducted at 16:22, prior to the start of the procedure. A Minimum amount of bleeding was controlled with Pressure. The procedure was tolerated well with a pain level of 0 throughout and a pain level of 0 following the procedure. Post Debridement Measurements: 1.1cm length x 0.8cm width x 0.1cm depth; 0.069cm^3 volume. Character of Wound/Ulcer Post Debridement is improved. Severity of Tissue Post Debridement is: Fat layer exposed. Post procedure Diagnosis Wound #11: Same  as Pre-Procedure Wound #12 Pre-procedure diagnosis of Wound #12 is a Pressure Ulcer located on the Right T Great . There was a Selective/Open Wound Non-Viable Tissue Debridement oe with a total area of 0.42 sq cm performed by Fredirick Maudlin, MD. With the following instrument(s): Curette to remove Non-Viable tissue/material. Material removed includes [facility] after achieving pain control using Lidocaine 4% Topical Solution. No specimens were taken. A time out was conducted at 16:22, prior to the start of the procedure. A Minimum amount of bleeding was controlled with Pressure. The procedure was tolerated well with a pain level of 0 throughout and a pain level of 0 following the procedure. Post Debridement Measurements: 0.6cm length x 0.7cm width x 0.1cm depth; 0.033cm^3 volume. Post debridement Stage noted as Category/Stage II. Character of Wound/Ulcer Post Debridement is improved. Post procedure Diagnosis Wound #12: Same as Pre-Procedure Plan Follow-up Appointments: Return appointment in 3 weeks. - Dr. Celine Ahr - Room 2 - 8/9 at 3:45 PM Bathing/ Shower/ Hygiene: May shower and wash wound with soap and water. - with dressing changes Edema Control - Lymphedema / SCD / Other: Elevate legs to the level of the heart or above for 30 minutes daily and/or when sitting, a frequency of: - throughout the  day Patient to wear own compression stockings every day. - juxtalites to both legs Exercise regularly Off-Loading: Other: - Prevalon boot to both feet. Please put pillow between foot and foot rest so the foot is not resting on the metal footrest. Home Health: Admit to [facility] for wound care. May utilize formulary equivalent dressing for wound treatment orders unless otherwise specified. New wound care orders this week; continue Home Health for wound care. May utilize formulary equivalent dressing for wound treatment orders unless otherwise specified. Other Home Health Orders/Instructions: [facility] WOUND #11: - Foot Wound Laterality: Cleanser: Soap and Water Every Other Day/15 Days Discharge Instructions: May shower and wash wound with dial antibacterial soap and water prior to dressing change. Cleanser: Wound Cleanser Every Other Day/15 Days Discharge Instructions: Cleanse the wound with wound cleanser prior to applying a clean dressing using gauze sponges, not tissue or cotton balls. Prim Dressing: KerraCel Ag Gelling Fiber Dressing, 2x2 in (silver alginate) (Generic) Every Other Day/15 Days ary Discharge Instructions: Apply silver alginate to wound bed as instructed Secondary Dressing: ALLEVYN Gentle Border, 4x4 (in/in) (Generic) Every Other Day/15 Days Discharge Instructions: Apply over primary dressing as directed. WOUND #12: - T Great Wound Laterality: Right oe Cleanser: Soap and Water Every Other Day/15 Days Discharge Instructions: May shower and wash wound with dial antibacterial soap and water prior to dressing change. Cleanser: Wound Cleanser Every Other Day/15 Days Discharge Instructions: Cleanse the wound with wound cleanser prior to applying a clean dressing using gauze sponges, not tissue or cotton balls. Prim Dressing: KerraCel Ag Gelling Fiber Dressing, 2x2 in (silver alginate) (Generic) Every Other Day/15 Days ary Discharge Instructions: Apply silver alginate to  wound bed as instructed Secondary Dressing: Woven Gauze Sponges 2x2 in Every Other Day/15 Days Discharge Instructions: Apply over primary dressing as directed. Secured With: Child psychotherapist, Sterile 2x75 (in/in) Every Other Day/15 Days Discharge Instructions: Secure with stretch gauze as directed. 07/12/2022: She has a new ulcer on the knuckle of her right great toe. It is fairly small but does expose the fat layer. No erythema induration or purulent drainage although there was some odor on her feet when the caps were removed. The wound on her left lateral foot is smaller with just a  small amount of slough buildup. I used a curette to debride slough from her new right great toe wound and her left lateral foot wound. We will use silver alginate to both sites. I told her to not wear the silicone toe caps, but she should wear her Prevalon boots at all times and ensure that her heels are not resting on any hard surfaces. Due to clinic scheduling, we will have her follow-up in 3 weeks. Electronic Signature(s) Signed: 07/12/2022 4:43:16 PM By: Fredirick Maudlin MD FACS Entered By: Fredirick Maudlin on 07/12/2022 16:43:16 -------------------------------------------------------------------------------- HxROS Details Patient Name: Date of Service: [name] [name], [name] [name] [name] 07/12/2022 3:45 PM Medical Record Number: [id number] Patient Account Number: [id number] Date of Birth/Sex: Treating RN: [birth year] (71 y.o. [name] [name] Primary Care Provider: Jim Like Other Clinician: Referring Provider: Treating Provider/Extender: Rory Percy in Treatment: 14 Information Obtained From Patient Eyes Medical History: Positive for: Cataracts Ear/Nose/Mouth/Throat Medical History: Past Medical History Notes: edentulous, GERD, seasonal allergies, trouble swallowing Hematologic/Lymphatic Medical History: Positive for: Anemia Past Medical History  Notes: hyperlipidemia Respiratory Medical History: Positive for: Sleep Apnea Cardiovascular Medical History: Positive for: Congestive Heart Failure; Hypertension Past Medical History Notes: complete heart block, bradycardia, paroxysmal atrial fibrillation, pacemaker Gastrointestinal Medical History: Past Medical History Notes: hemorrhoids Endocrine Medical History: Positive for: Type I Diabetes Past Medical History Notes: hypothyroidism Time with diabetes: 75 yrs Treated with: Insulin Blood sugar tested every day: Yes Tested : on pump Genitourinary Medical History: Past Medical History Notes: proteinuria, frequent UTIs Musculoskeletal Medical History: Past Medical History Notes: decreased ROM L shoulder, hemiparesis affecting L side, arthritis Neurologic Medical History: Past Medical History Notes: benign paroxysmal positional vertigo, intracranial vascular stenosis, lacunar infarction, stroke Oncologic Medical History: Positive for: Received Radiation Past Medical History Notes: thyroid cancer Psychiatric Medical History: Past Medical History Notes: depression HBO Extended History Items Eyes: Cataracts Immunizations Pneumococcal Vaccine: Received Pneumococcal Vaccination: Yes Received Pneumococcal Vaccination On or After 60th Birthday: Yes Implantable Devices Yes Hospitalization / Surgery History Type of Hospitalization/Surgery pacemaker implant loop recorder insertion and removal esophagogastroduodenoscopy TEE without cardioversion colonoscopy left and right heart catheterization abdominal angiogram thyroidectomy abdominal hysterectomy ankle fracture surgery Family and Social History Heart Disease: Yes - Mother; Hypertension: Yes - Mother,Father; Stroke: Yes - Child; Never smoker; Marital Status - Divorced; Alcohol Use: Never; Drug Use: No History; Caffeine Use: Never; Financial Concerns: No; Food, Clothing or Shelter Needs: No; Support System  Lacking: No; Transportation Concerns: No Electronic Signature(s) Signed: 07/12/2022 4:48:40 PM By: Fredirick Maudlin MD FACS Signed: 07/12/2022 5:01:06 PM By: Sabas Sous By: Fredirick Maudlin on 07/12/2022 16:36:34 -------------------------------------------------------------------------------- SuperBill Details Patient Name: Date of Service: [name] [name], [name] [name] [name] 07/12/2022 Medical Record Number: [id number] Patient Account Number: [id number] Date of Birth/Sex: Treating RN: 02-09-1951 (71 y.o. 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 G86.76 Type 1 diabetes mellitus with diabetic polyneuropathy Z68.41 Body mass index [BMI]40.0-44.9, adult E10.622 Type 1 diabetes mellitus with other skin ulcer L97.422 Non-pressure chronic ulcer of left heel and midfoot with fat layer exposed L97.512 Non-pressure chronic ulcer of other part of right foot with fat layer exposed Facility Procedures CPT4 Code: 19509326 Description: 71245 - DEBRIDE WOUND 1ST 20 SQ CM OR < ICD-10 Diagnosis Description L97.422 Non-pressure chronic ulcer of left  heel and midfoot with fat layer exposed L97.512 Non-pressure chronic ulcer of other part of right foot with fat layer exposed Modifier: Quantity: 1 Physician Procedures : CPT4 Code Description Modifier [id number] - WC PHYS LEVEL 3 - EST PT ICD-10 Diagnosis Description L97.422 Non-pressure chronic ulcer of left heel and midfoot with fat layer exposed L97.512 Non-pressure chronic ulcer of other part of right foot with  fat layer exposed I89.0 Lymphedema, not elsewhere classified Quantity: 1 Electronic  Signature(s) Signed: 07/12/2022 4:44:05 PM By: Fredirick Maudlin MD FACS Entered By: Fredirick Maudlin on 07/12/2022 16:44:04

## 2022-07-12 NOTE — Progress Notes (Signed)
[name], [name] ([id number]) Visit Report for 07/12/2022 Arrival Information Details Patient Name: Date of Service: [place], [name] 07/12/2022 3:45 PM Medical Record Number: [id number] Patient Account Number: [id number] Date of Birth/Sex: Treating RN: Dec 18, 1951 (71 y.o. [name] Primary Care Dellamae Rosamilia: Jim Like Other Clinician: Referring Tyler Robidoux: Treating Tashanna Dolin/Extender: Rory Percy in Treatment: 14 Visit Information History Since Last Visit Added or deleted any medications: No Patient Arrived: Wheel Chair Any new allergies or adverse reactions: No Arrival Time: 16:08 Had a fall or experienced change in No Accompanied By: granddaughter activities of daily living that may affect Transfer Assistance: Manual risk of falls: Patient Identification Verified: Yes Signs or symptoms of abuse/neglect since last visito No Secondary Verification Process Completed: Yes Hospitalized since last visit: No Patient Requires Transmission-Based Precautions: No Implantable device outside of the clinic excluding No Patient Has Alerts: Yes cellular tissue based products placed in the center Patient Alerts: Patient on Blood Thinner since last visit: pacemaker Has Dressing in Place as Prescribed: Yes Pain Present Now: No Electronic Signature(s) Signed: 07/12/2022 5:01:06 PM By: Adline Peals Entered By: Adline Peals on 07/12/2022 16:09:21 -------------------------------------------------------------------------------- Encounter Discharge Information Details Patient Name: Date of Service: [name], [name] 07/12/2022 3:45 PM Medical Record Number: [id number] Patient Account Number: [id number] Date of Birth/Sex: Treating RN: 11-18-1951 (71 y.o. [name] Primary Care [name]: Jim Like Other Clinician: Referring [name]: Treating [name]/Extender: Rory Percy in Treatment:  14 Encounter Discharge Information Items Post Procedure Vitals Discharge Condition: Stable Temperature (F): 97.8 Ambulatory Status: Wheelchair Pulse (bpm): 62 Discharge Destination: Home Respiratory Rate (breaths/min): 20 Transportation: Private Auto Blood Pressure (mmHg): 140/52 Accompanied By: daughter Schedule Follow-up Appointment: Yes Clinical Summary of Care: Patient Declined Electronic Signature(s) Signed: 07/12/2022 5:01:06 PM By: Adline Peals Entered By: Adline Peals on 07/12/2022 16:54:08 -------------------------------------------------------------------------------- Lower Extremity Assessment Details Patient Name: Date of Service: [name] [name] 07/12/2022 3:45 PM Medical Record Number: [id number] Patient Account Number: [id number] Date of Birth/Sex: Treating RN: 04/04/1951 (71 y.o. [name] Primary Care Maksymilian Mabey: Jim Like Other Clinician: Referring [name]: Treating [name]/Extender: Rory Percy in Treatment: 14 Edema Assessment Assessed: Shirlyn Goltz: No] [Right: No] E[Left: dema] [Right: :] Calf Left: Right: Point of Measurement: From Medial Instep 38.4 cm 35.2 cm Ankle Left: Right: Point of Measurement: From Medial Instep 23.7 cm 22.3 cm Vascular Assessment Pulses: Dorsalis Pedis Palpable: [Left:Yes] [Right:Yes] Electronic Signature(s) Signed: 07/12/2022 5:01:06 PM By: Adline Peals Entered By: Adline Peals on 07/12/2022 16:10:37 -------------------------------------------------------------------------------- Multi Wound Chart Details Patient Name: Date of Service: [name], [name] 07/12/2022 3:45 PM Medical Record Number: [id number] Patient Account Number: [id number] Date of Birth/Sex: Treating RN: 15-Apr-1951 (71 y.o. [name] Primary Care [name]: Jim Like Other Clinician: Referring Caylynn Minchew: Treating [name]/Extender: Rory Percy in Treatment: 14 Vital Signs Height(in): 24 Pulse(bpm): 30 Weight(lbs): 243 Blood Pressure(mmHg): 140/52 Body Mass Index(BMI): 38.1 Temperature(F): 97.8 Respiratory Rate(breaths/min): 20 Photos: [11:Foot] [12:Right T Great oe] [N/A:N/A N/A] Wound Location: [11:Pressure Injury] [12:Pressure Injury] [N/A:N/A] Wounding Event: [11:Diabetic Wound/Ulcer of the Lower] [12:Pressure Ulcer] [N/A:N/A] Primary Etiology: [11:Extremity Cataracts, Anemia, Sleep Apnea,] [12:Cataracts, Anemia, Sleep Apnea,] [N/A:N/A] Comorbid History: [11:Congestive Heart Failure, Hypertension, Type I Diabetes, Received Radiation 06/16/2022] [12:Congestive Heart Failure, Hypertension, Type I Diabetes, Received Radiation 07/12/2022] [N/A:N/A] Date Acquired: [11:1] [12:0] [N/A:N/A] Weeks of Treatment: [11:Open] [12:Open] [N/A:N/A] Wound Status: [11:No] [12:No] [N/A:N/A] Wound Recurrence: [11:1.1x0.8x0.1] [12:0.6x0.7x0.1] [N/A:N/A] Measurements L x W x D (cm) [11:0.691] [  12:0.33] [N/A:N/A] A (cm) : rea [11:0.069] [12:0.033] [N/A:N/A] Volume (cm) : [11:58.10%] [12:0.00%] [N/A:N/A] % Reduction in A [11:rea: 58.20%] [12:0.00%] [N/A:N/A] % Reduction in Volume: [11:Grade 2] [12:Category/Stage II] [N/A:N/A] Classification: [11:Medium] [12:Medium] [N/A:N/A] Exudate A mount: [11:Serosanguineous] [12:Serosanguineous] [N/A:N/A] Exudate Type: [11:red, brown] [12:red, brown] [N/A:N/A] Exudate Color: [11:Distinct, outline attached] [12:Distinct, outline attached] [N/A:N/A] Wound Margin: [11:Medium (34-66%)] [12:Large (67-100%)] [N/A:N/A] Granulation A mount: [11:Red] [12:Red] [N/A:N/A] Granulation Quality: [11:Medium (34-66%)] [12:Small (1-33%)] [N/A:N/A] Necrotic A mount: [11:Fat Layer (Subcutaneous Tissue): Yes Fat Layer (Subcutaneous Tissue): Yes N/A] Exposed Structures: [11:Fascia: No Tendon: No Muscle: No Joint: No Bone: No Small (1-33%)] [12:Fascia: No Tendon: No Muscle: No Joint: No Bone: No None]  [N/A:N/A] Epithelialization: [11:Debridement - Selective/Open Wound Debridement - Selective/Open Wound N/A] Debridement: Pre-procedure Verification/Time Out 16:22 [12:16:22] [N/A:N/A] Taken: [11:Lidocaine 4% Topical Solution] [12:Lidocaine 4% Topical Solution] [N/A:N/A] Pain Control: [11:Slough] [12:Slough] [N/A:N/A] Tissue Debrided: [11:Non-Viable Tissue] [12:Non-Viable Tissue] [N/A:N/A] Level: [11:0.88] [12:0.42] [N/A:N/A] Debridement A (sq cm): [11:rea Curette] [12:Curette] [N/A:N/A] Instrument: [11:Minimum] [12:Minimum] [N/A:N/A] Bleeding: [11:Pressure] [12:Pressure] [N/A:N/A] Hemostasis A chieved: [11:0] [12:0] [N/A:N/A] Procedural Pain: [11:0] [12:0] [N/A:N/A] Post Procedural Pain: [11:Procedure was tolerated well] [12:Procedure was tolerated well] [N/A:N/A] Debridement Treatment Response: [11:1.1x0.8x0.1] [12:0.6x0.7x0.1] [N/A:N/A] Post Debridement Measurements L x W x D (cm) [11:0.069] [12:0.033] [N/A:N/A] Post Debridement Volume: (cm) [11:N/A] [12:Category/Stage II] [N/A:N/A] Post Debridement Stage: [11:Debridement] [12:Debridement] [N/A:N/A] Treatment Notes Electronic Signature(s) Signed: 07/12/2022 4:26:54 PM By: Fredirick Maudlin MD FACS Signed: 07/12/2022 5:01:06 PM By: Adline Peals Entered By: Fredirick Maudlin on 07/12/2022 16:26:54 -------------------------------------------------------------------------------- Multi-Disciplinary Care Plan Details Patient Name: Date of Service: [name], [name] [place] [name] 07/12/2022 3:45 PM Medical Record Number: [id number] Patient Account Number: [id number] Date of Birth/Sex: Treating RN: 09/04/1951 (71 y.o. [name] Primary Care Taraya Steward: Jim Like Other Clinician: Referring Algis Lehenbauer: Treating Kristien Salatino/Extender: Rory Percy in Treatment: 14 Multidisciplinary Care Plan reviewed with physician Active Inactive Abuse / Safety / Falls / Self Care Management Nursing Diagnoses: History of  Falls Impaired physical mobility Goals: Patient/caregiver will verbalize/demonstrate measures taken to prevent injury and/or falls Date Initiated: 04/03/2022 Target Resolution Date: 08/01/2022 Goal Status: Active Interventions: Assess fall risk on admission and as needed Assess: immobility, friction, shearing, incontinence upon admission and as needed Assess impairment of mobility on admission and as needed per policy Notes: Nutrition Nursing Diagnoses: Impaired glucose control: actual or potential Potential for alteratiion in Nutrition/Potential for imbalanced nutrition Goals: Patient/caregiver will maintain therapeutic glucose control Date Initiated: 04/03/2022 Target Resolution Date: 08/01/2022 Goal Status: Active Interventions: Assess HgA1c results as ordered upon admission and as needed Provide education on elevated blood sugars and impact on wound healing Treatment Activities: Patient referred to Primary Care Physician for further nutritional evaluation : 04/03/2022 Notes: Venous Leg Ulcer Nursing Diagnoses: Knowledge deficit related to disease process and management Potential for venous Insuffiency (use before diagnosis confirmed) Goals: Patient will maintain optimal edema control Date Initiated: 04/03/2022 Target Resolution Date: 08/01/2022 Goal Status: Active Interventions: Assess peripheral edema status every visit. Compression as ordered Provide education on venous insufficiency Treatment Activities: Therapeutic compression applied : 04/03/2022 Notes: Electronic Signature(s) Signed: 07/12/2022 5:01:06 PM By: Adline Peals Entered By: Adline Peals on 07/12/2022 16:14:18 -------------------------------------------------------------------------------- Pain Assessment Details Patient Name: Date of Service: [name] Doom M. 07/12/2022 3:45 PM Medical Record Number: [id number] Patient Account Number: [id number] Date of Birth/Sex: Treating RN: 07-09-1951 (71  y.o. [name] Primary Care Elmin [name]: Jim Like Other Clinician: Referring Shaliah Wann: Treating Cason Dabney/Extender: Rory Percy in Treatment: 14 Active Problems  Location of Pain Severity and Description of Pain Patient Has Paino No Site Locations Rate the pain. Current Pain Level: 0 Pain Management and Medication Current Pain Management: Electronic Signature(s) Signed: 07/12/2022 5:01:06 PM By: Adline Peals Entered By: Adline Peals on 07/12/2022 16:10:13 -------------------------------------------------------------------------------- Patient/Caregiver Education Details Patient Name: Date of Service: Jacqulyn Ducking. 7/19/2023andnbsp3:45 PM Medical Record Number: [id number] Patient Account Number: [id number] Date of Birth/Gender: Treating RN: 10/08/1951 (71 y.o. [name] Primary Care Physician: Jim Like Other Clinician: Referring Physician: Treating Physician/Extender: Rory Percy in Treatment: 14 Education Assessment Education Provided To: Patient Education Topics Provided Wound/Skin Impairment: Methods: Explain/Verbal Responses: Reinforcements needed, State content correctly Electronic Signature(s) Signed: 07/12/2022 5:01:06 PM By: Adline Peals Entered By: Adline Peals on 07/12/2022 16:14:32 -------------------------------------------------------------------------------- Wound Assessment Details Patient Name: Date of Service: [name], [name] 07/12/2022 3:45 PM Medical Record Number: [id number] Patient Account Number: [id number] Date of Birth/Sex: Treating RN: Dec 13, 1951 (71 y.o. [name] Primary Care Zahria Ding: Jim Like Other Clinician: Referring Llana Deshazo: Treating Baneza Bartoszek/Extender: Rory Percy in Treatment: 14 Wound Status Wound Number: 11 Primary Diabetic Wound/Ulcer of the Lower  Extremity Etiology: Wound Location: Foot Wound Open Wounding Event: Pressure Injury Status: Date Acquired: 06/16/2022 Comorbid Cataracts, Anemia, Sleep Apnea, Congestive Heart Failure, Weeks Of Treatment: 1 History: Hypertension, Type I Diabetes, Received Radiation Clustered Wound: No Photos Wound Measurements Length: (cm) 1.1 Width: (cm) 0.8 Depth: (cm) 0.1 Area: (cm) 0.691 Volume: (cm) 0.069 % Reduction in Area: 58.1% % Reduction in Volume: 58.2% Epithelialization: Small (1-33%) Tunneling: No Undermining: No Wound Description Classification: Grade 2 Wound Margin: Distinct, outline attached Exudate Amount: Medium Exudate Type: Serosanguineous Exudate Color: red, brown Foul Odor After Cleansing: No Slough/Fibrino Yes Wound Bed Granulation Amount: Medium (34-66%) Exposed Structure Granulation Quality: Red Fascia Exposed: No Necrotic Amount: Medium (34-66%) Fat Layer (Subcutaneous Tissue) Exposed: Yes Necrotic Quality: Adherent Slough Tendon Exposed: No Muscle Exposed: No Joint Exposed: No Bone Exposed: No Treatment Notes Wound #11 (Foot) Cleanser Soap and Water Discharge Instruction: May shower and wash wound with dial antibacterial soap and water prior to dressing change. Wound Cleanser Discharge Instruction: Cleanse the wound with wound cleanser prior to applying a clean dressing using gauze sponges, not tissue or cotton balls. Peri-Wound Care Topical Primary Dressing KerraCel Ag Gelling Fiber Dressing, 2x2 in (silver alginate) Discharge Instruction: Apply silver alginate to wound bed as instructed Secondary Dressing ALLEVYN Gentle Border, 4x4 (in/in) Discharge Instruction: Apply over primary dressing as directed. Secured With Compression Wrap Compression Stockings Environmental education officer) Signed: 07/12/2022 4:31:08 PM By: Sharyn Creamer RN, BSN Signed: 07/12/2022 5:01:06 PM By: Adline Peals Entered By: Sharyn Creamer on 07/12/2022  16:16:04 -------------------------------------------------------------------------------- Wound Assessment Details Patient Name: Date of Service: [name], [name] [place] [name] 07/12/2022 3:45 PM Medical Record Number: [id number] Patient Account Number: [id number] Date of Birth/Sex: Treating RN: 05/17/1951 (71 y.o. [name] Primary Care Shaana Acocella: Jim Like Other Clinician: Referring Jodie Cavey: Treating Valente Fosberg/Extender: Rory Percy in Treatment: 14 Wound Status Wound Number: 12 Primary Pressure Ulcer Etiology: Wound Location: Right T Great oe Wound Open Wounding Event: Pressure Injury Status: Date Acquired: 07/12/2022 Comorbid Cataracts, Anemia, Sleep Apnea, Congestive Heart Failure, Weeks Of Treatment: 0 History: Hypertension, Type I Diabetes, Received Radiation Clustered Wound: No Photos Wound Measurements Length: (cm) 0.6 Width: (cm) 0.7 Depth: (cm) 0.1 Area: (cm) 0.33 Volume: (cm) 0.033 % Reduction in Area: 0% % Reduction in Volume: 0% Epithelialization: None Tunneling: No Undermining: No Wound Description Classification:  Category/Stage II Wound Margin: Distinct, outline attached Exudate Amount: Medium Exudate Type: Serosanguineous Exudate Color: red, brown Foul Odor After Cleansing: No Slough/Fibrino Yes Wound Bed Granulation Amount: Large (67-100%) Exposed Structure Granulation Quality: Red Fascia Exposed: No Necrotic Amount: Small (1-33%) Fat Layer (Subcutaneous Tissue) Exposed: Yes Necrotic Quality: Adherent Slough Tendon Exposed: No Muscle Exposed: No Joint Exposed: No Bone Exposed: No Treatment Notes Wound #12 (Toe Great) Wound Laterality: Right Cleanser Soap and Water Discharge Instruction: May shower and wash wound with dial antibacterial soap and water prior to dressing change. Wound Cleanser Discharge Instruction: Cleanse the wound with wound cleanser prior to applying a clean dressing using gauze  sponges, not tissue or cotton balls. Peri-Wound Care Topical Primary Dressing KerraCel Ag Gelling Fiber Dressing, 2x2 in (silver alginate) Discharge Instruction: Apply silver alginate to wound bed as instructed Secondary Dressing Woven Gauze Sponges 2x2 in Discharge Instruction: Apply over primary dressing as directed. Secured With Conforming Stretch Gauze Bandage, Sterile 2x75 (in/in) Discharge Instruction: Secure with stretch gauze as directed. Compression Wrap Compression Stockings Add-Ons Electronic Signature(s) Signed: 07/12/2022 4:31:08 PM By: Sharyn Creamer RN, BSN Signed: 07/12/2022 5:01:06 PM By: Adline Peals Entered By: Sharyn Creamer on 07/12/2022 16:16:23 -------------------------------------------------------------------------------- Vitals Details Patient Name: Date of Service: [name], [name] 07/12/2022 3:45 PM Medical Record Number: [id number] Patient Account Number: [id number] Date of Birth/Sex: Treating RN: 01-21-1951 (71 y.o. [name] Primary Care Zakai Gonyea: Jim Like Other Clinician: Referring Unity Luepke: Treating Musa Rewerts/Extender: Rory Percy in Treatment: 14 Vital Signs Time Taken: 16:09 Temperature (F): 97.8 Height (in): 67 Pulse (bpm): 62 Weight (lbs): 243 Respiratory Rate (breaths/min): 20 Body Mass Index (BMI): 38.1 Blood Pressure (mmHg): 140/52 Reference Range: 80 - 120 mg / dl Electronic Signature(s) Signed: 07/12/2022 5:01:06 PM By: Adline Peals Entered By: Adline Peals on 07/12/2022 16:09:54

## 2022-07-18 ENCOUNTER — Other Ambulatory Visit: Payer: Self-pay

## 2022-07-20 ENCOUNTER — Telehealth: Payer: Self-pay | Admitting: Student

## 2022-07-20 ENCOUNTER — Telehealth: Payer: Self-pay | Admitting: *Deleted

## 2022-07-20 NOTE — Chronic Care Management (AMB) (Signed)
  Care Coordination  Note  07/20/2022 Name: [name] MRN: [id number] DOB: 28-Sep-1951  [name] [name] is a 71 y.o. year old female who is a primary care patient of Eppie Gibson, MD. I reached out to [name] by phone today to offer care coordination services.      [name] was given information about Care Coordination services today including:  The Care Coordination services include support from the care team which includes your Nurse Coordinator, Clinical Social Worker, or Pharmacist.  The Care Coordination team is here to help remove barriers to the health concerns and goals most important to you. Care Coordination services are voluntary and the patient may decline or stop services at any time by request to their care team member.   Patient agreed to services and verbal consent obtained.   Follow up plan: Telephone appointment with care coordination team member scheduled for:07/21/22  [place]: [phone]

## 2022-07-20 NOTE — Telephone Encounter (Signed)
Patient calling requesting the social worker give her a call back. She would not say what for, but that she needed to speak to someone. Please advise.

## 2022-07-21 ENCOUNTER — Ambulatory Visit: Payer: Self-pay | Admitting: Licensed Clinical Social Worker

## 2022-07-24 NOTE — Progress Notes (Signed)
Remote pacemaker transmission.   

## 2022-07-24 NOTE — Patient Instructions (Signed)
Visit Information  Instructions: patient will work with SW to address concerns related to Level of care  Patient was given the following information about care management and care coordination services today, agreed to services, and gave verbal consent: 1.care management/care coordination services include personalized support from designated clinical staff supervised by their physician, including individualized plan of care and coordination with other care providers 2. 24/7 contact phone numbers for assistance for urgent and routine care needs. 3. The patient may stop care management/care coordination services at any time by phone call to the office staff.  Patient verbalizes understanding of instructions and care plan provided today and agrees to view in [place]. Active MyChart status and patient understanding of how to access instructions and care plan via MyChart confirmed with patient.     The care management team will reach out to the patient again over the next 30 days.   Lenor Derrick , MSW Social Worker [facility]  [phone]

## 2022-07-24 NOTE — Patient Outreach (Signed)
  Care Coordination   Initial Visit Note   07/24/2022 Name: [name] MRN: [id number] DOB: [birth year]  [name] is a 71 y.o. year old female who sees Eppie Gibson, MD for primary care. I spoke with  [name] by phone today  What matters to the patients health and wellness today?  Patient discussed feeling like a [place] pig with having interchanging providers. SW will send a message to the care-team addressing patients concerns. Patient advised she would move practices if she can't be provided with a consistent provider. SW explained reasons for changing providers. Client persist she wants a consistent  provider. Patient discussed no additional barriers or concerns.     SDOH assessments and interventions completed:   Yes   Care Coordination Interventions Activated:  Yes Care Coordination Interventions:  Yes, provided  Follow up plan: Follow up call scheduled for within the next 30 days  Encounter Outcome:  Pt. Visit Completed  Milus Height, [place] Worker [facility]  [phone]

## 2022-07-24 NOTE — Patient Outreach (Signed)
  Care Coordination   Initial Visit Note   07/21/2022 Name: Gerard Bonus MRN: [id number] DOB: 1951-02-21  [name] is a 71 y.o. year old female who sees Eppie Gibson, MD for primary care. I spoke with  [name] by phone today  What matters to the patients health and wellness today?  Patient discussed filling like a [place] pig and wanted a consistent provider. SW explained how the clinic works. Patient was persistent stating she wanted someone consistent due to feeling like he medical care was not being addressed. Patient advised she will need to move to another practice. SW reviewed notes and seen practice is aware. SW completed needs assessment and SDOH. No additional concerns or questions at this time.      SDOH assessments and interventions completed:   Yes   Care Coordination Interventions Activated:  Yes Care Coordination Interventions:  Yes, provided  Follow up plan: Follow up call scheduled for within the next 30 days.  Encounter Outcome:  Pt. Visit Completed  Lenor Derrick , MSW Social Worker [facility]  [phone]

## 2022-07-25 ENCOUNTER — Ambulatory Visit: Payer: Medicare PPO | Admitting: Cardiology

## 2022-07-27 ENCOUNTER — Encounter: Payer: Self-pay | Admitting: Pulmonary Disease

## 2022-07-27 ENCOUNTER — Ambulatory Visit (INDEPENDENT_AMBULATORY_CARE_PROVIDER_SITE_OTHER): Payer: Medicare PPO | Admitting: Pulmonary Disease

## 2022-07-27 VITALS — BP 128/72 | HR 52 | Temp 98.6°F | Ht 67.0 in | Wt 253.0 lb

## 2022-07-27 DIAGNOSIS — G4733 Obstructive sleep apnea (adult) (pediatric): Secondary | ICD-10-CM | POA: Diagnosis not present

## 2022-07-27 DIAGNOSIS — J9611 Chronic respiratory failure with hypoxia: Secondary | ICD-10-CM | POA: Diagnosis not present

## 2022-07-27 DIAGNOSIS — R053 Chronic cough: Secondary | ICD-10-CM

## 2022-07-27 NOTE — Patient Instructions (Signed)
It is nice to meet you  Please continue wearing the oxygen at all times  To help with the cough, continue the Allegra you are taking.  Use Flonase 1 spray each nostril twice a day.  I sent a prescription in for this.  It could be cheaper over-the-counter.  Feel free to check.  There is some concern for pulmonary hypertension.  We will need to do a invasive procedure to diagnosis.  At this time, I do not recommend we do this.  The medicines can be difficult to tolerate and I worry that it may make things better than worse in your situation.  We can continue to talk about this in the future.  Return to clinic in 3 months or sooner if needed with Dr. Silas Flood

## 2022-07-30 MED ORDER — FLUTICASONE PROPIONATE 50 MCG/ACT NA SUSP
1.0000 | Freq: Two times a day (BID) | NASAL | 12 refills | Status: DC
Start: 2022-07-30 — End: 2023-08-02

## 2022-07-30 NOTE — Progress Notes (Signed)
Eyes:  '@Patient'$  ID: [name], female    DOB: 10-29-1951, 71 y.o.   MRN: [id number]  Chief Complaint  Patient presents with   Consult    Pt is here for consult for pulm htn and low oxygen. Pt states that she has POC. She is currently on 2L on POC. She is in wheelchair. Lower legs are noted to be swollen. Pt states she has never been dx with pulm htn. Pt states she has been having issues with oxygen for 6 months or longer.     Referring provider: Dickie La, MD  HPI:   71 y.o. woman whom are seen in consultation for evaluation of pulmonary hypertension and hypoxemia.  Most recent PCP note reviewed.  Most recent cardiology note reviewed.  Pulmonary note during hospitalization 01/2022 reviewed.  Patient with longstanding cardiac history including intermittent volume overload.  Atrial fibrillation.  On anticoagulation.  Has longstanding dyspnea.  Shortness of breath.  However, she is been largely wheelchair-bound for a couple years.  Following strokes.  She is on oxygen for 6 months.  This is on discharge from the hospital.  Hospital discharge summary 01/2022 reviewed at the time she was placed on oxygen.  At that time she was diuresed.  TTE estimated elevated PASP.  Breathing about the same.  She reports good adherence to oxygen.  She has not used BiPAP.  She has not used in some time.  Does not use regularly.  Despite rest and the importance of using this.  At last hospitalization she was asked to do this.  She is afraid for a few days and does not use it reliably and months.  Reviewed most recent chest x-ray 05/2019.  Shows enlarged cardiac silhouette, prominent interstitium suggestive of volume overload on my interpretation.  PMH: ESRD on HD, atrial fibrillation, diabetes, hypothyroidism, CVA Surgical history: Hysterectomy, ankle fracture surgery, Family history: Mother with CAD, hyperlipidemia, hypertension, renal disease, father with CVA Social history: Never smoker, lives in  [place] / Pulmonary Flowsheets:   ACT:      No data to display          MMRC:     No data to display          Epworth:      No data to display          Tests:   FENO:  No results found for: "NITRICOXIDE"  PFT:     No data to display          WALK:      No data to display          Imaging: Personally reviewed and as per EMR discussion this note CUP PACEART REMOTE DEVICE CHECK  Result Date: 07/07/2022 Scheduled remote reviewed. Normal device function.  Known AF, controlled rates, burden 64.6%, +OAC Next remote 91 days. LA   Lab Results: Personally reviewed CBC    Component Value Date/Time   WBC 4.7 05/29/2022 1326   RBC 3.83 (L) 05/29/2022 1326   HGB 10.9 (L) 05/29/2022 1326   HGB 11.0 (L) 03/02/2022 1548   HCT 35.5 (L) 05/29/2022 1326   HCT 33.8 (L) 03/02/2022 1548   PLT 203 05/29/2022 1326   PLT 185 03/02/2022 1548   MCV 92.7 05/29/2022 1326   MCV 88 03/02/2022 1548   MCH 28.5 05/29/2022 1326   MCHC 30.7 05/29/2022 1326   RDW 15.1 05/29/2022 1326   RDW 12.9 03/02/2022 1548   LYMPHSABS 1.0  02/10/2022 1212   LYMPHSABS 0.9 05/21/2019 0947   MONOABS 0.6 02/10/2022 1212   EOSABS 0.1 02/10/2022 1212   EOSABS 0.0 05/21/2019 0947   BASOSABS 0.0 02/10/2022 1212   BASOSABS 0.0 05/21/2019 0947    BMET    Component Value Date/Time   NA 142 05/29/2022 1326   NA 145 (H) 03/02/2022 1548   K 3.6 05/29/2022 1326   CL 105 05/29/2022 1326   CO2 29 05/29/2022 1326   GLUCOSE 228 (H) 05/29/2022 1326   BUN 10 05/29/2022 1326   BUN 13 03/02/2022 1548   CREATININE 1.07 (H) 05/29/2022 1326   CREATININE 1.03 (H) 08/24/2015 1652   CALCIUM 9.1 05/29/2022 1326   GFRNONAA 56 (L) 05/29/2022 1326   GFRAA >60 06/25/2020 1131    BNP    Component Value Date/Time   BNP 190.5 (H) 05/29/2022 1326    ProBNP No results found for: "PROBNP"  Specialty Problems       Pulmonary Problems   OSA (obstructive sleep apnea)     BiPAP at home Severe Range 05/27/2019: need f/u with sleep clinic in 10 weeks- not yet bringing patients in for in-lab testing for CPAP titration studies, due to the virus pandemic> autoPAP       Dyspnea   Hypoxia   SOB (shortness of breath)    Allergies  Allergen Reactions   Latex Rash    Immunization History  Administered Date(s) Administered   Fluad Quad(high Dose 65+) 11/16/2021   Influenza, High Dose Seasonal PF 10/12/2017   Influenza,inj,Quad PF,6+ Mos 08/20/2013, 09/30/2014, 09/22/2015, 09/24/2016, 12/09/2018, 09/24/2019   Influenza-Unspecified 09/24/2017   PFIZER(Purple Top)SARS-COV-2 Vaccination 06/28/2020   Pneumococcal Conjugate-13 11/24/2016   Pneumococcal Polysaccharide-23 01/25/2012, 06/07/2018   Tdap 01/25/2012   Zoster Recombinat (Shingrix) 04/11/2017    Past Medical History:  Diagnosis Date   Abnormal echocardiogram    Anemia    Arthritis    BPPV (benign paroxysmal positional vertigo), right 06/13/2017   Bradycardia 03/07/2020   Cerebrovascular accident (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 ([place]) 10/29/2015   CHB (complete heart block) ([place]) 03/08/2020   CHF (congestive heart failure) ([facility])    Closed fracture of left proximal humerus 07/08/2019   Decreased range of motion of left shoulder 09/03/2020   Depression    Dizzy 03/07/2020   bradycardia   Edentulous 09/03/2020   GERD (gastroesophageal reflux disease)    Hemiparesis affecting left side as late effect of cerebrovascular accident (CVA) ([place]) 03/08/2020   Hemorrhoids    History of Falls with injury 03/08/2020   Fall resulting in left humeral fracture   Hyperlipidemia    Hypertension    Hypothyroidism    Intracranial vascular stenosis    Lacunar infarction ([place])    Brain MRI 07/2021 multiple small remote pontine lacunar infarcts   Near syncope 03/08/2020   Paroxysmal atrial fibrillation ([facility])    Presence of permanent cardiac pacemaker    Proteinuria 03/08/2020   Seasonal allergies     Stroke ([place])    Thyroid cancer ([place])    per pt S/p Total Thyroidectomy with Radioactive Iodine Therapy   Type 1 diabetes mellitus ([place])     Tobacco History: Social History   Tobacco Use  Smoking Status Never  Smokeless Tobacco Never   Counseling given: Not Answered   Continue to not smoke  Outpatient Encounter Medications as of 07/27/2022  Medication Sig   acetaminophen (TYLENOL) 325 MG tablet Take 2 tablets (650 mg total) by mouth every 6 (six) hours as needed  for moderate pain.   apixaban (ELIQUIS) 5 MG TABS tablet Take 1 tablet (5 mg total) by mouth 2 (two) times daily.   atorvastatin (LIPITOR) 80 MG tablet Take 1 tablet (80 mg total) by mouth every evening.   busPIRone (BUSPAR) 5 MG tablet Take 5 mg by mouth daily.   Fexofenadine HCl (ALLEGRA PO) Take 10 mg by mouth 2 (two) times daily.   fluticasone (FLONASE) 50 MCG/ACT nasal spray Place 1 spray into both nostrils daily.   furosemide (LASIX) 40 MG tablet Take 1 tablet (40 mg total) by mouth 2 (two) times daily.   gabapentin (NEURONTIN) 300 MG capsule Take 1 capsule (300 mg total) by mouth as needed.   insulin aspart (NOVOLOG) 100 UNIT/ML injection Inject 5 Units into the skin 3 (three) times daily before meals. For blood sugar greater than or equal to 200   Insulin Human (INSULIN PUMP) SOLN Inject 1 each into the skin 3 times daily with meals, bedtime and 2 AM.   levothyroxine (SYNTHROID) 175 MCG tablet Take 1 tablet (175 mcg total) by mouth daily before breakfast.   loratadine (CLARITIN) 10 MG tablet Take 10 mg by mouth every evening.   metoprolol succinate (TOPROL-XL) 50 MG 24 hr tablet Take 1 tablet (50 mg total) by mouth daily. Take with or immediately following a meal.   pantoprazole (PROTONIX) 40 MG tablet Take 1 tablet (40 mg total) by mouth 2 (two) times daily.   polyethylene glycol powder (GLYCOLAX/MIRALAX) 17 GM/SCOOP powder Take 17 g by mouth daily. (Patient taking differently: Take 17 g by mouth as needed.)    polyethylene glycol powder (GLYCOLAX/MIRALAX) 17 GM/SCOOP powder Take 17 g by mouth 2 (two) times daily as needed for moderate constipation.   potassium chloride (KLOR-CON) 10 MEQ tablet Take 1 tablet (10 mEq total) by mouth daily.   traMADol (ULTRAM) 50 MG tablet Take 1 tablet (50 mg total) by mouth every 6 (six) hours as needed.   No facility-administered encounter medications on file as of 07/27/2022.     Review of Systems  Review of Systems  No chest pain with exertion.  No orthopnea or PND.  Comprehensive review of systems otherwise negative. Physical Exam  BP 128/72 (BP Location: Left Arm, Patient Position: Sitting, Cuff Size: Normal)   Pulse (!) 52   Temp 98.6 F (37 C) (Oral)   Ht '5\' 7"'$  (1.702 m)   Wt 253 lb (114.8 kg)   SpO2 96%   BMI 39.63 kg/m   Wt Readings from Last 5 Encounters:  07/27/22 253 lb (114.8 kg)  03/02/22 243 lb (110.2 kg)  02/15/22 243 lb 13.3 oz (110.6 kg)  01/20/22 250 lb (113.4 kg)  11/23/21 250 lb (113.4 kg)    BMI Readings from Last 5 Encounters:  07/27/22 39.63 kg/m  03/02/22 38.06 kg/m  02/15/22 38.19 kg/m  01/20/22 39.16 kg/m  12/29/21 39.16 kg/m     Physical Exam General: Sitting in chair, chronically ill-appearing EOMI, no icterus Neck: Supple, JVP difficult to assess given habitus Pulmonary: Clear, normal work of breathing Cardiovascular: Warm, significant lower extremity edema noted Abdomen: Nondistended, bowel sounds present MSK: No synovitis, no joint effusion Neuro: Normal gait, global but symmetric weakness in lower extremities, upper extremities okay Psych: Normal mood, full affect   Assessment & Plan:   Presumed pulmonary hypertension: Based on echocardiogram findings with elevated right-sided pressures.  Likely multifactorial due to group 2 disease given the signs of left-sided volume overload on the chest imaging in the past, group  2 disease in setting of hypoxemia and OSA nonadherent to NIPPV, group 5 disease in  the setting of hemodialysis.  Unfortunate she is a very poor candidate for vasodilators given the multifactorial nature of her disease.  She is nonadherent to BiPAP therapy which was explained to her is a foundational treatment of group 3 disease related to OSA.  Furthermore, she is essentially wheelchair-bound due to prior strokes and weakness.  Do not think she would benefit much in terms of dyspnea with pulmonary evaluation given the risk she is a poor candidate.  Strongly recommend I encouraged her to reengage with sleep doctor.  Could pursue with her nephrologist assessing flows through fistula and if elevated evaluating possibility of banding of fistula to reduce flows to decrease stress on the RV.  Chronic hypoxemic respiratory failure: Likely multifactorial related related to OHS, VQ mismatch with presumed pulmonary hypertension, and intermittent volume overload.  Chronic cough: She endorses postnasal drip, nasal congestion.  Addition of Flonase 1 spray each nostril twice daily.  Continue Allegra.   Return in about 3 months (around 10/27/2022).   Lanier Clam, MD 07/30/2022

## 2022-08-02 ENCOUNTER — Ambulatory Visit: Payer: Self-pay | Admitting: Licensed Clinical Social Worker

## 2022-08-02 ENCOUNTER — Telehealth: Payer: Self-pay | Admitting: Licensed Clinical Social Worker

## 2022-08-02 ENCOUNTER — Encounter (HOSPITAL_BASED_OUTPATIENT_CLINIC_OR_DEPARTMENT_OTHER): Payer: Medicare PPO | Attending: General Surgery | Admitting: General Surgery

## 2022-08-02 DIAGNOSIS — E1065 Type 1 diabetes mellitus with hyperglycemia: Secondary | ICD-10-CM | POA: Diagnosis not present

## 2022-08-02 DIAGNOSIS — E1059 Type 1 diabetes mellitus with other circulatory complications: Secondary | ICD-10-CM | POA: Diagnosis not present

## 2022-08-02 DIAGNOSIS — Z8673 Personal history of transient ischemic attack (TIA), and cerebral infarction without residual deficits: Secondary | ICD-10-CM | POA: Insufficient documentation

## 2022-08-02 DIAGNOSIS — E10622 Type 1 diabetes mellitus with other skin ulcer: Secondary | ICD-10-CM | POA: Diagnosis not present

## 2022-08-02 DIAGNOSIS — L97512 Non-pressure chronic ulcer of other part of right foot with fat layer exposed: Secondary | ICD-10-CM | POA: Insufficient documentation

## 2022-08-02 DIAGNOSIS — Z9981 Dependence on supplemental oxygen: Secondary | ICD-10-CM | POA: Diagnosis not present

## 2022-08-02 DIAGNOSIS — L97422 Non-pressure chronic ulcer of left heel and midfoot with fat layer exposed: Secondary | ICD-10-CM | POA: Diagnosis not present

## 2022-08-02 DIAGNOSIS — I890 Lymphedema, not elsewhere classified: Secondary | ICD-10-CM | POA: Insufficient documentation

## 2022-08-02 DIAGNOSIS — E1042 Type 1 diabetes mellitus with diabetic polyneuropathy: Secondary | ICD-10-CM | POA: Diagnosis not present

## 2022-08-02 DIAGNOSIS — I5032 Chronic diastolic (congestive) heart failure: Secondary | ICD-10-CM | POA: Diagnosis not present

## 2022-08-02 DIAGNOSIS — I872 Venous insufficiency (chronic) (peripheral): Secondary | ICD-10-CM | POA: Diagnosis not present

## 2022-08-02 DIAGNOSIS — L89892 Pressure ulcer of other site, stage 2: Secondary | ICD-10-CM | POA: Diagnosis not present

## 2022-08-02 DIAGNOSIS — I110 Hypertensive heart disease with heart failure: Secondary | ICD-10-CM | POA: Insufficient documentation

## 2022-08-02 NOTE — Patient Outreach (Signed)
  Care Coordination   08/02/2022 Name: [name] MRN: [id number] DOB: 1951-12-01   Care Coordination Outreach Attempts:  An unsuccessful telephone outreach was attempted today to offer the patient information about available care coordination services as a benefit of their health plan.   Follow Up Plan:  Additional outreach attempts will be made to offer the patient care coordination information and services.   Encounter Outcome:  No Answer  Care Coordination Interventions Activated:  No   Care Coordination Interventions:  No, not indicated    Lenor Derrick , MSW Social Worker [facility]  [phone]

## 2022-08-02 NOTE — Progress Notes (Signed)
[name], [name] ([id number]) Visit Report for 08/02/2022 Chief Complaint Document Details Patient Name: Date of Service: [name]. 08/02/2022 3:45 PM Medical Record Number: [id number] Patient Account Number: [id number] Date of Birth/Sex: Treating RN: 1951-12-20 (71 y.o. [name] Primary Care Provider: Jim [name] Other Clinician: Referring Provider: Treating Provider/Extender: [name] in Treatment: 17 Information Obtained from: Patient Chief Complaint Patient presents for treatment of open ulcers due to venous insufficiency in the setting of poorly controlled DM1 and CHF Electronic Signature(s) Signed: 08/02/2022 4:24:13 PM By: [name] Maudlin MD FACS Entered By: [name] on 08/02/2022 16:24:13 -------------------------------------------------------------------------------- Debridement Details Patient Name: Date of Service: [name], [name] SE [name] M. 08/02/2022 3:45 PM Medical Record Number: [id number] Patient Account Number: [id number] Date of Birth/Sex: Treating RN: Apr 29, 1951 (71 y.o. [name] Primary Care Provider: Jim [name] Other Clinician: Referring Provider: Treating Provider/Extender: [name] in Treatment: 17 Debridement Performed for Assessment: Wound #12 Right T Great oe Performed By: Physician [name] Maudlin, MD Debridement Type: Debridement Level of Consciousness (Pre-procedure): Awake and Alert Pre-procedure Verification/Time Out Yes - 16:14 Taken: Start Time: 16:14 Pain Control: Lidocaine 4% T opical Solution T Area Debrided (L x W): otal 0.3 (cm) x 0.3 (cm) = 0.09 (cm) Tissue and other material debrided: Non-Viable, Eschar Level: Non-Viable Tissue Debridement Description: Selective/Open Wound Instrument: Curette Bleeding: Minimum Hemostasis Achieved: Pressure Procedural Pain: 0 Post Procedural Pain: 0 Response to Treatment: Procedure was tolerated  well Level of Consciousness (Post- Awake and Alert procedure): Post Debridement Measurements of Total Wound Length: (cm) 0.1 Stage: Category/Stage II Width: (cm) 0.1 Depth: (cm) 0.1 Volume: (cm) 0.001 Character of Wound/Ulcer Post Debridement: Improved Post Procedure Diagnosis Same as Pre-procedure Electronic Signature(s) Signed: 08/02/2022 4:44:02 PM By: [name] Maudlin MD FACS Signed: 08/02/2022 5:04:31 PM By: Adline Peals Entered By: Adline Peals on 08/02/2022 16:14:52 -------------------------------------------------------------------------------- HPI Details Patient Name: Date of Service: [name], [name] SE [name] M. 08/02/2022 3:45 PM Medical Record Number: [id number] Patient Account Number: [id number] Date of Birth/Sex: Treating RN: 10-Oct-1951 (71 y.o. [name] Primary Care Provider: Jim [name] Other Clinician: Referring Provider: Treating Provider/Extender: [name] in Treatment: 17 History of Present Illness HPI Description: ADMISSION 04/03/2022 This is a 71 year old woman with a past medical history notable for type 1 diabetes mellitus, poorly controlled with her most recent A1c being 8.3, congestive heart failure with recent admission for exacerbation secondary to noncompliance with diuretics, as well as history of pontine stroke and oxygen dependence. She has wounds on her bilateral lower extremities. She says they have been present for about 2 months and initially started as blisters. She says that she has compression stockings, but does not wear them because they are too difficult to get on. ABIs done in clinic were 1.4 and 1.5, suggesting some degree of noncompressibility. She also has a wound on her right heel that she says is secondary to it getting bumped frequently as she transfers. She came to clinic with just dry dressings on her legs. She does have home health care, as well as her sister that have been assisting her  with her dressings. She has multiple wounds. On the left medial calf, just above her ankle, there is a somewhat geographic wound with slough and some epithelialization. On the anterior tibial surface and lateral calf, the wounds are quite desiccated with yellow eschar present. On the right anterior tibial surface, the wounds are gritty with thin slough and eschar. The posterior right  ankle wound is just above the calcaneus overlying the Achilles and has thick yellow-gray slough and a bit of odor. No purulent drainage appreciated. 04/10/2022: The wounds on her left leg are closed. On the right, the anterior tibial surface wounds are epithelializing and much cleaner. There is just a layer of biofilm on the open areas. On the posterior right ankle wound, there is a decreased volume of slough and no odor. We have been using Hydrofera Blue to all sites under compression. 04/17/2022: The wounds on her left leg remained closed and she did bring her juxta lite stockings with her today. On the right, the more distal wound is closed and the proximal wound has epithelialized significantly since her last visit. At the posterior right ankle, she still has accumulated some slough and some periwound callus but there is no odor or significant drainage. 04/24/2022: The right anterior leg wounds are closed. She still has a wound on her posterior right ankle that looks about the same as last week. She did not bring her right sided juxta light with her today but is wearing the left sided stocking. 05/01/2022: She has a tiny abrasion on her left anterior tibia which we did not add to her wound count today. The wound on her posterior right ankle is a little bit smaller with a little accumulated slough. No drainage or concern for infection. 05/08/2022: She continues to forget to bring her juxta lite stocking with her so she remains in a compression wrap despite healing of the right anterior leg wounds. The wound on her posterior  right ankle/calcaneus is a bit smaller today with just a little bit of accumulated slough. She is wearing a juxta lite stocking on the left leg. 05/15/2022: She says that she has been wearing her juxta lite stockings at home, but today she is wearing regular compression stockings that she purchased from [employer]; the juxta lite stockings are in the laundry. The wound on her posterior right ankle/calcaneus continues to contract and has just a little bit of accumulated slough. Unfortunately, her edema control is suboptimal on the left and she has developed several small blisters with a larger 1 at the medial calf. 05/23/2022: The patient has been wearing her juxta lite stockings. Unfortunately, she has developed a large blister on her right lower leg just above the ankle. The skin overlying it is still intact. The blister that had opened on her left medial calf is smaller; and the other small blistered sites are closed. The posterior right ankle/calcaneus is about the same today. 06/07/2022: The only remaining open wound is on her posterior right calcaneus. The majority of this is covered with eschar. 06/30/2022: I am not sure why the patient did not return to clinic until today, but the ulcer on her right heel has healed. Unfortunately, she has developed a new ulcer on her left lateral foot. From the position of it, it looks as though the outward rotation of her lower leg at rest is probably causing the lateral midfoot to rub or press against the metal leg rests of her wheelchair. She is not padding the chair when she rests her leg on the leg rests. The new ulcer is fairly superficial with just the fat layer exposed. It is quite tender. No erythema, induration, or purulent drainage identified. 07/12/2022: After our last visit, she was seen by podiatry. Nail care was performed. For some reason he had her apply silicone toe covers on her bilateral great toes. As result, the skin is  now very macerated and she has  a new ulcer on the knuckle of her right great toe. It is fairly small but does expose the fat layer. No erythema induration or purulent drainage although there was some odor on her feet when the caps were removed. The wound on her left lateral foot is smaller with just a small amount of slough buildup. Her Prevalon boots arrived and we will put her in those after her dressing is changed. 08/02/2022: She has been wearing her Prevalon boots to some extent, but says that she spends most of her day in her wheelchair and the boots slide off the foot rest. She apparently has not been utilizing the leg strap. The left lateral foot wound is tender and the tissue has some discoloration suggestive of ongoing pressure in this area. There is a little bit of callus accumulation circumferentially. The wound on her right great toe DIP joint is nearly closed and just has a light layer of eschar overlying the surface. No concern for infection. Electronic Signature(s) Signed: 08/02/2022 4:25:30 PM By: [name] Maudlin MD FACS Entered By: [name] on 08/02/2022 16:25:30 -------------------------------------------------------------------------------- Physical Exam Details Patient Name: Date of Service: [name], [name] SE [name] M. 08/02/2022 3:45 PM Medical Record Number: [id number] Patient Account Number: [id number] Date of Birth/Sex: Treating RN: 12/07/1951 (71 y.o. [name] Primary Care Provider: Jim [name] Other Clinician: Referring Provider: Treating Provider/Extender: [name] in Treatment: 17 Constitutional . Bradycardic, asymptomatic.. . . No acute distress.Marland Kitchen Respiratory Normal work of breathing on room air.. Notes 08/02/2022: The left lateral foot wound is tender and the tissue has some discoloration suggestive of ongoing pressure in this area. There is a little bit of callus accumulation circumferentially. The wound on her right great toe DIP joint is nearly  closed and just has a light layer of eschar overlying the surface. No concern for infection. Electronic Signature(s) Signed: 08/02/2022 4:26:20 PM By: [name] Maudlin MD FACS Entered By: [name] on 08/02/2022 16:26:19 -------------------------------------------------------------------------------- Physician Orders Details Patient Name: Date of Service: [name], [name] SE [name] M. 08/02/2022 3:45 PM Medical Record Number: [id number] Patient Account Number: [id number] Date of Birth/Sex: Treating RN: [birth year] (71 y.o. [name] Primary Care Provider: Jim [name] Other Clinician: Referring Provider: Treating Provider/Extender: [name] in Treatment: 17 Verbal / Phone Orders: No Diagnosis Coding ICD-10 Coding Code Description I89.0 Lymphedema, not elsewhere classified H47.65 Chronic diastolic (congestive) heart failure E10.59 Type 1 diabetes mellitus with other circulatory complications Y65.03 Type 1 diabetes mellitus with diabetic polyneuropathy Z68.41 Body mass index [BMI]40.0-44.9, adult E10.622 Type 1 diabetes mellitus with other skin ulcer L97.422 Non-pressure chronic ulcer of left heel and midfoot with fat layer exposed L97.512 Non-pressure chronic ulcer of other part of right foot with fat layer exposed Follow-up Appointments ppointment in 2 weeks. - Dr. Celine Ahr - room 2 - 8/23 at 3:45 PM Return A Bathing/ Shower/ Hygiene May shower and wash wound with soap and water. - with dressing changes Edema Control - Lymphedema / SCD / Other Bilateral Lower Extremities Elevate legs to the level of the heart or above for 30 minutes daily and/or when sitting, a frequency of: - throughout the day Patient to wear own compression stockings every day. - juxtalites to both legs Exercise regularly Off-Loading Other: - Prevalon boot to both feet. Please put pillow between foot and foot rest so the foot is not resting on the metal footrest. Home  Health Admit to [facility] for wound care.  May utilize formulary equivalent dressing for wound treatment orders unless otherwise specified. New wound care orders this week; continue Home Health for wound care. May utilize formulary equivalent dressing for wound treatment orders unless otherwise specified. Other Home Health Orders/Instructions: [facility] Wound Treatment Wound #11 - Foot Cleanser: Soap and Water Every Other Day/15 Days Discharge Instructions: May shower and wash wound with dial antibacterial soap and water prior to dressing change. Cleanser: Wound Cleanser Every Other Day/15 Days Discharge Instructions: Cleanse the wound with wound cleanser prior to applying a clean dressing using gauze sponges, not tissue or cotton balls. Prim Dressing: KerraCel Ag Gelling Fiber Dressing, 2x2 in (silver alginate) (Generic) Every Other Day/15 Days ary Discharge Instructions: Apply silver alginate to wound bed as instructed Secondary Dressing: Optifoam Non-Adhesive Dressing, 4x4 in Every Other Day/15 Days Discharge Instructions: Apply over primary dressing as directed. Secondary Dressing: Woven Gauze Sponge, Non-Sterile 4x4 in Every Other Day/15 Days Discharge Instructions: Apply over primary dressing as directed. Secured With: [employer], 4.5x3.1 (in/yd) Every Other Day/15 Days Discharge Instructions: Secure with Kerlix as directed. Secured With: 8M Medipore Public affairs consultant Surgical T 2x10 (in/yd) Every Other Day/15 Days ape Discharge Instructions: Secure with tape as directed. Wound #12 - T Great oe Wound Laterality: Right Cleanser: Soap and Water Every Other Day/15 Days Discharge Instructions: May shower and wash wound with dial antibacterial soap and water prior to dressing change. Cleanser: Wound Cleanser Every Other Day/15 Days Discharge Instructions: Cleanse the wound with wound cleanser prior to applying a clean dressing using gauze sponges, not tissue or cotton balls. Prim  Dressing: KerraCel Ag Gelling Fiber Dressing, 2x2 in (silver alginate) (Generic) Every Other Day/15 Days ary Discharge Instructions: Apply silver alginate to wound bed as instructed Secondary Dressing: Woven Gauze Sponges 2x2 in Every Other Day/15 Days Discharge Instructions: Apply over primary dressing as directed. Secured With: Child psychotherapist, Sterile 2x75 (in/in) Every Other Day/15 Days Discharge Instructions: Secure with stretch gauze as directed. Electronic Signature(s) Signed: 08/02/2022 4:44:02 PM By: [name] Maudlin MD FACS Entered By: [name] on 08/02/2022 16:26:40 -------------------------------------------------------------------------------- Problem List Details Patient Name: Date of Service: [name], [name] SE [name] M. 08/02/2022 3:45 PM Medical Record Number: [id number] Patient Account Number: [id number] Date of Birth/Sex: Treating RN: 1951/04/07 (71 y.o. [name] Primary Care Provider: Jim [name] Other Clinician: Referring Provider: Treating Provider/Extender: [name] in Treatment: 17 Active Problems ICD-10 Encounter Code Description Active Date MDM Diagnosis I89.0 Lymphedema, not elsewhere classified 04/03/2022 No Yes L54.49 Chronic diastolic (congestive) heart failure 04/03/2022 No Yes E10.59 Type 1 diabetes mellitus with other circulatory complications 01/25/70 No Yes E10.42 Type 1 diabetes mellitus with diabetic polyneuropathy 04/03/2022 No Yes Z68.41 Body mass index [BMI]40.0-44.9, adult 04/03/2022 No Yes E10.622 Type 1 diabetes mellitus with other skin ulcer 04/03/2022 No Yes L97.422 Non-pressure chronic ulcer of left heel and midfoot with fat layer exposed 06/30/2022 No Yes L97.512 Non-pressure chronic ulcer of other part of right foot with fat layer exposed 07/12/2022 No Yes Inactive Problems ICD-10 Code Description Active Date Inactive Date L97.212 Non-pressure chronic ulcer of right calf with fat  layer exposed 04/03/2022 04/03/2022 L97.222 Non-pressure chronic ulcer of left calf with fat layer exposed 04/03/2022 04/03/2022 L97.312 Non-pressure chronic ulcer of right ankle with fat layer exposed 04/03/2022 04/03/2022 L97.821 Non-pressure chronic ulcer of other part of left lower leg limited to breakdown of skin 05/15/2022 05/15/2022 L97.811 Non-pressure chronic ulcer of other part of right lower leg limited to breakdown of skin 05/23/2022  05/23/2022 Resolved Problems Electronic Signature(s) Signed: 08/02/2022 4:21:16 PM By: [name] Maudlin MD FACS Entered By: [name] on 08/02/2022 16:21:16 -------------------------------------------------------------------------------- Progress Note Details Patient Name: Date of Service: [name], [name] SE [name] M. 08/02/2022 3:45 PM Medical Record Number: [id number] Patient Account Number: [id number] Date of Birth/Sex: Treating RN: 02-25-1951 (71 y.o. [name] Primary Care Provider: Jim [name] Other Clinician: Referring Provider: Treating Provider/Extender: [name] in Treatment: 17 Subjective Chief Complaint Information obtained from Patient Patient presents for treatment of open ulcers due to venous insufficiency in the setting of poorly controlled DM1 and CHF History of Present Illness (HPI) ADMISSION 04/03/2022 This is a 71 year old woman with a past medical history notable for type 1 diabetes mellitus, poorly controlled with her most recent A1c being 8.3, congestive heart failure with recent admission for exacerbation secondary to noncompliance with diuretics, as well as history of pontine stroke and oxygen dependence. She has wounds on her bilateral lower extremities. She says they have been present for about 2 months and initially started as blisters. She says that she has compression stockings, but does not wear them because they are too difficult to get on. ABIs done in clinic were 1.4 and 1.5,  suggesting some degree of noncompressibility. She also has a wound on her right heel that she says is secondary to it getting bumped frequently as she transfers. She came to clinic with just dry dressings on her legs. She does have home health care, as well as her sister that have been assisting her with her dressings. She has multiple wounds. On the left medial calf, just above her ankle, there is a somewhat geographic wound with slough and some epithelialization. On the anterior tibial surface and lateral calf, the wounds are quite desiccated with yellow eschar present. On the right anterior tibial surface, the wounds are gritty with thin slough and eschar. The posterior right ankle wound is just above the calcaneus overlying the Achilles and has thick yellow-gray slough and a bit of odor. No purulent drainage appreciated. 04/10/2022: The wounds on her left leg are closed. On the right, the anterior tibial surface wounds are epithelializing and much cleaner. There is just a layer of biofilm on the open areas. On the posterior right ankle wound, there is a decreased volume of slough and no odor. We have been using Hydrofera Blue to all sites under compression. 04/17/2022: The wounds on her left leg remained closed and she did bring her juxta lite stockings with her today. On the right, the more distal wound is closed and the proximal wound has epithelialized significantly since her last visit. At the posterior right ankle, she still has accumulated some slough and some periwound callus but there is no odor or significant drainage. 04/24/2022: The right anterior leg wounds are closed. She still has a wound on her posterior right ankle that looks about the same as last week. She did not bring her right sided juxta light with her today but is wearing the left sided stocking. 05/01/2022: She has a tiny abrasion on her left anterior tibia which we did not add to her wound count today. The wound on her posterior  right ankle is a little bit smaller with a little accumulated slough. No drainage or concern for infection. 05/08/2022: She continues to forget to bring her juxta lite stocking with her so she remains in a compression wrap despite healing of the right anterior leg wounds. The wound on her posterior right ankle/calcaneus is a  bit smaller today with just a little bit of accumulated slough. She is wearing a juxta lite stocking on the left leg. 05/15/2022: She says that she has been wearing her juxta lite stockings at home, but today she is wearing regular compression stockings that she purchased from [employer]; the juxta lite stockings are in the laundry. The wound on her posterior right ankle/calcaneus continues to contract and has just a little bit of accumulated slough. Unfortunately, her edema control is suboptimal on the left and she has developed several small blisters with a larger 1 at the medial calf. 05/23/2022: The patient has been wearing her juxta lite stockings. Unfortunately, she has developed a large blister on her right lower leg just above the ankle. The skin overlying it is still intact. The blister that had opened on her left medial calf is smaller; and the other small blistered sites are closed. The posterior right ankle/calcaneus is about the same today. 06/07/2022: The only remaining open wound is on her posterior right calcaneus. The majority of this is covered with eschar. 06/30/2022: I am not sure why the patient did not return to clinic until today, but the ulcer on her right heel has healed. Unfortunately, she has developed a new ulcer on her left lateral foot. From the position of it, it looks as though the outward rotation of her lower leg at rest is probably causing the lateral midfoot to rub or press against the metal leg rests of her wheelchair. She is not padding the chair when she rests her leg on the leg rests. The new ulcer is fairly superficial with just the fat layer  exposed. It is quite tender. No erythema, induration, or purulent drainage identified. 07/12/2022: After our last visit, she was seen by podiatry. Nail care was performed. For some reason he had her apply silicone toe covers on her bilateral great toes. As result, the skin is now very macerated and she has a new ulcer on the knuckle of her right great toe. It is fairly small but does expose the fat layer. No erythema induration or purulent drainage although there was some odor on her feet when the caps were removed. The wound on her left lateral foot is smaller with just a small amount of slough buildup. Her Prevalon boots arrived and we will put her in those after her dressing is changed. 08/02/2022: She has been wearing her Prevalon boots to some extent, but says that she spends most of her day in her wheelchair and the boots slide off the foot rest. She apparently has not been utilizing the leg strap. The left lateral foot wound is tender and the tissue has some discoloration suggestive of ongoing pressure in this area. There is a little bit of callus accumulation circumferentially. The wound on her right great toe DIP joint is nearly closed and just has a light layer of eschar overlying the surface. No concern for infection. Patient History Information obtained from Patient. Family History Heart Disease - Mother, Hypertension - Mother,Father, Stroke - Child. Social History Never smoker, Marital Status - Divorced, Alcohol Use - Never, Drug Use - No History, Caffeine Use - Never. Medical History Eyes Patient has history of Cataracts Hematologic/Lymphatic Patient has history of Anemia Respiratory Patient has history of Sleep Apnea Cardiovascular Patient has history of Congestive Heart Failure, Hypertension Endocrine Patient has history of Type I Diabetes Oncologic Patient has history of Received Radiation Hospitalization/Surgery History - pacemaker implant. - loop recorder insertion and  removal. - esophagogastroduodenoscopy. -  TEE without cardioversion. - colonoscopy. - left and right heart catheterization. - abdominal angiogram. - thyroidectomy. - abdominal hysterectomy. - ankle fracture surgery. Medical A Surgical History Notes nd Ear/Nose/Mouth/Throat edentulous, GERD, seasonal allergies, trouble swallowing Hematologic/Lymphatic hyperlipidemia Cardiovascular complete heart block, bradycardia, paroxysmal atrial fibrillation, pacemaker Gastrointestinal hemorrhoids Endocrine hypothyroidism Genitourinary proteinuria, frequent UTIs Musculoskeletal decreased ROM L shoulder, hemiparesis affecting L side, arthritis Neurologic benign paroxysmal positional vertigo, intracranial vascular stenosis, lacunar infarction, stroke Oncologic thyroid cancer Psychiatric depression Objective Constitutional Bradycardic, asymptomatic.Marland Kitchen No acute distress.. Vitals Time Taken: 3:50 PM, Height: 67 in, Weight: 243 lbs, BMI: 38.1, Temperature: 98.5 F, Pulse: 46 bpm, Respiratory Rate: 20 breaths/min, Blood Pressure: 140/60 mmHg. Respiratory Normal work of breathing on room air.. General Notes: 08/02/2022: The left lateral foot wound is tender and the tissue has some discoloration suggestive of ongoing pressure in this area. There is a little bit of callus accumulation circumferentially. The wound on her right great toe DIP joint is nearly closed and just has a light layer of eschar overlying the surface. No concern for infection. Integumentary (Hair, Skin) Wound #11 status is Open. Original cause of wound was Pressure Injury. The date acquired was: 06/16/2022. The wound has been in treatment 4 weeks. The wound is located on the Foot. The wound measures 0.5cm length x 0.6cm width x 0.1cm depth; 0.236cm^2 area and 0.024cm^3 volume. There is Fat Layer (Subcutaneous Tissue) exposed. There is a medium amount of serosanguineous drainage noted. The wound margin is distinct with the outline  attached to the wound base. There is medium (34-66%) red granulation within the wound bed. There is a medium (34-66%) amount of necrotic tissue within the wound bed including Adherent Slough. Wound #12 status is Open. Original cause of wound was Pressure Injury. The date acquired was: 07/12/2022. The wound has been in treatment 3 weeks. The wound is located on the Right T Great. The wound measures 0.1cm length x 0.1cm width x 0.1cm depth; 0.008cm^2 area and 0.001cm^3 volume. There is Fat oe Layer (Subcutaneous Tissue) exposed. There is a medium amount of serosanguineous drainage noted. The wound margin is distinct with the outline attached to the wound base. There is large (67-100%) red granulation within the wound bed. There is a small (1-33%) amount of necrotic tissue within the wound bed including Adherent Slough. 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 There was a Selective/Open Wound Non-Viable Tissue Debridement oe with a total area of 0.09 sq cm performed by [name] Maudlin, MD. With the following instrument(s): Curette to remove Non-Viable tissue/material. Material removed includes Eschar after achieving pain control using Lidocaine 4% T opical Solution. No specimens were taken. A time out was conducted at 16:14, prior to the start of the procedure. A Minimum amount of bleeding was controlled with Pressure. The procedure was tolerated well with a pain  level of 0 throughout and a pain level of 0 following the procedure. Post Debridement Measurements: 0.1cm length x 0.1cm width x 0.1cm depth; 0.001cm^3 volume. Post debridement Stage noted as Category/Stage II. Character of Wound/Ulcer Post Debridement is  improved. Post procedure Diagnosis Wound #12: Same as Pre-Procedure Plan Follow-up Appointments: Return Appointment in 2 weeks. - Dr. Celine Ahr - room 2 - 8/23 at 3:45 PM Bathing/ Shower/ Hygiene: May shower and wash wound with soap and water. - with dressing changes Edema Control - Lymphedema / SCD / Other: Elevate legs to the level of the heart or above for 30 minutes daily and/or when sitting, a frequency of: - throughout the day Patient to wear own compression stockings every day. - juxtalites to both legs Exercise regularly Off-Loading: Other: - Prevalon boot to both feet. Please put pillow between foot and foot rest so the foot is not resting on the metal footrest. Home Health: Admit to [facility] for wound care. May utilize formulary equivalent dressing for wound treatment orders unless otherwise specified. New wound care orders this week; continue Home Health for wound care. May utilize formulary equivalent dressing for wound treatment orders unless otherwise specified. Other Home Health Orders/Instructions: [facility] WOUND #11: - Foot Wound Laterality: Cleanser: Soap and Water Every Other Day/15 Days Discharge Instructions: May shower and wash wound with dial antibacterial soap and water prior to dressing change. Cleanser: Wound Cleanser Every Other Day/15 Days Discharge Instructions: Cleanse the wound with wound cleanser prior to applying a clean dressing using gauze sponges, not tissue or cotton balls. Prim Dressing: KerraCel Ag Gelling Fiber Dressing, 2x2 in (silver alginate) (Generic) Every Other Day/15 Days ary Discharge Instructions: Apply silver alginate to wound bed as instructed Secondary Dressing: Optifoam Non-Adhesive  Dressing, 4x4 in Every Other Day/15 Days Discharge Instructions: Apply over primary dressing as directed. Secondary Dressing: Woven Gauze Sponge, Non-Sterile 4x4 in Every Other Day/15 Days Discharge Instructions: Apply over primary dressing as directed. Secured With: [employer], 4.5x3.1 (in/yd) Every Other Day/15 Days Discharge Instructions: Secure with Kerlix as directed. Secured With: 57M Medipore Public affairs consultant Surgical T 2x10 (in/yd) Every Other Day/15 Days ape Discharge Instructions: Secure with tape as directed. WOUND #12: - T Great Wound Laterality: Right oe Cleanser: Soap and Water Every Other Day/15 Days Discharge Instructions: May shower and wash wound with dial antibacterial soap and water prior to dressing change. Cleanser: Wound Cleanser Every Other Day/15 Days Discharge Instructions: Cleanse the wound with wound cleanser prior to applying a clean dressing using gauze sponges, not tissue or cotton balls. Prim Dressing: KerraCel Ag Gelling Fiber Dressing, 2x2 in (silver alginate) (Generic) Every Other Day/15 Days ary Discharge Instructions: Apply silver alginate to wound bed as instructed Secondary Dressing: Woven Gauze Sponges 2x2 in Every Other Day/15 Days Discharge Instructions: Apply over primary dressing as directed. Secured With: Child psychotherapist, Sterile 2x75 (in/in) Every Other Day/15 Days Discharge Instructions: Secure with stretch gauze as directed. 08/02/2022: The left lateral foot wound is tender and the tissue has some discoloration suggestive of ongoing pressure in this area. There is a little bit of callus accumulation circumferentially. The wound on her right great toe DIP joint is nearly closed and just has a light layer of eschar overlying the surface. No concern for infection. I used a curette to debride callus from the periwound on the lateral foot wound and the eschar from the great toe wound. We will continue using silver alginate. We will  use extra Optifoam to make a donut to better protect the lateral aspect of her left foot. I also asked her to try to use her leg strap on her wheelchair so that her feet stay on the foot rest and she can still wear her  Prevalon boots. She will follow-up in 2 weeks. Electronic Signature(s) Signed: 08/02/2022 4:27:31 PM By: [name] Maudlin MD FACS Entered By: [name] on 08/02/2022 16:27:31 -------------------------------------------------------------------------------- HxROS Details Patient Name: Date of Service: [name], [name] SE [name] M. 08/02/2022 3:45 PM Medical Record Number: [id number] Patient Account Number: [id number] Date of Birth/Sex: Treating RN: 1951/07/03 (71 y.o. [name] Primary Care Provider: Jim [name] Other Clinician: Referring Provider: Treating Provider/Extender: [name] in Treatment: 17 Information Obtained From Patient Eyes Medical History: Positive for: Cataracts Ear/Nose/Mouth/Throat Medical History: Past Medical History Notes: edentulous, GERD, seasonal allergies, trouble swallowing Hematologic/Lymphatic Medical History: Positive for: Anemia Past Medical History Notes: hyperlipidemia Respiratory Medical History: Positive for: Sleep Apnea Cardiovascular Medical History: Positive for: Congestive Heart Failure; Hypertension Past Medical History Notes: complete heart block, bradycardia, paroxysmal atrial fibrillation, pacemaker Gastrointestinal Medical History: Past Medical History Notes: hemorrhoids Endocrine Medical History: Positive for: Type I Diabetes Past Medical History Notes: hypothyroidism Time with diabetes: 58 yrs Treated with: Insulin Blood sugar tested every day: Yes Tested : on pump Genitourinary Medical History: Past Medical History Notes: proteinuria, frequent UTIs Musculoskeletal Medical History: Past Medical History Notes: decreased ROM L shoulder, hemiparesis affecting L  side, arthritis Neurologic Medical History: Past Medical History Notes: benign paroxysmal positional vertigo, intracranial vascular stenosis, lacunar infarction, stroke Oncologic Medical History: Positive for: Received Radiation Past Medical History Notes: thyroid cancer Psychiatric Medical History: Past Medical History Notes: depression HBO Extended History Items Eyes: Cataracts Immunizations Pneumococcal Vaccine: Received Pneumococcal Vaccination: Yes Received Pneumococcal Vaccination On or After 60th Birthday: Yes Implantable Devices Yes Hospitalization / Surgery History Type of Hospitalization/Surgery pacemaker implant loop recorder insertion and removal esophagogastroduodenoscopy TEE without cardioversion colonoscopy left and right heart catheterization abdominal angiogram thyroidectomy abdominal hysterectomy ankle fracture surgery Family and Social History Heart Disease: Yes - Mother; Hypertension: Yes - Mother,Father; Stroke: Yes - Child; Never smoker; Marital Status - Divorced; Alcohol Use: Never; Drug Use: No History; Caffeine Use: Never; Financial Concerns: No; Food, Clothing or Shelter Needs: No; Support System Lacking: No; Transportation Concerns: No Electronic Signature(s) Signed: 08/02/2022 4:44:02 PM By: [name] Maudlin MD FACS Signed: 08/02/2022 5:04:31 PM By: Sabas Sous By: [name] on 08/02/2022 16:25:35 -------------------------------------------------------------------------------- SuperBill Details Patient Name: Date of Service: [name], [name] SE [name] M. 08/02/2022 Medical Record Number: [id number] Patient Account Number: [id number] Date of Birth/Sex: Treating RN: 04-May-1951 (71 y.o. 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 V37.10 Type 1 diabetes mellitus with diabetic polyneuropathy Z68.41 Body mass index [BMI]40.0-44.9, adult E10.622 Type 1 diabetes mellitus with other skin ulcer L97.422 Non-pressure chronic ulcer of left heel and midfoot with fat layer exposed L97.512 Non-pressure chronic ulcer of other part of right foot with fat layer exposed Facility Procedures CPT4 Code: [id number] Description: [phone] - DEBRIDE WOUND 1ST 20 SQ CM OR < ICD-10 Diagnosis Description L97.512 Non-pressure chronic ulcer of other part of right foot with fat layer exposed L97.422 Non-pressure chronic ulcer of left heel and midfoot with fat layer exposed Modifier: Quantity: 1 Physician Procedures : CPT4 Code Description Modifier [id number] - WC PHYS LEVEL 3 - EST PT 25 ICD-10 Diagnosis Description L97.422 Non-pressure chronic ulcer of left heel and midfoot with fat layer exposed L97.512 Non-pressure chronic ulcer of other part of right foot  with fat layer exposed E10.622 Type 1 diabetes mellitus with other skin ulcer I89.0 Lymphedema, not elsewhere classified Quantity: 1 : [phone] [id number] - WC PHYS DEBR WO ANESTH 20 SQ CM ICD-10 Diagnosis Description L97.512 Non-pressure chronic ulcer of other part of right foot with fat layer exposed L97.422 Non-pressure chronic ulcer of left heel and midfoot with fat layer exposed Quantity: 1 Electronic Signature(s) Signed: 08/02/2022 4:28:06 PM By: [name] Maudlin MD FACS Entered By: [name] on 08/02/2022 16:28:05

## 2022-08-02 NOTE — Patient Outreach (Signed)
  Care Coordination   Follow Up Visit Note   08/02/2022 Name: [name] MRN: [id number] DOB: November 03, 1951  [name] is a 71 y.o. year old female who sees Eppie Gibson, MD for primary care. I spoke with  [name] by phone today  What matters to the patients health and wellness today?  Food Insecurities and PCP   Goals Addressed               This Visit's Progress     Care Coordinator- Food Insecurities (pt-stated)        . Patient in need of food resources. Per patients request, SW emailed patient a listing of food pantries in [place]. . SW collaborated with careteam as patient is requesting a permanent provider and not a resident.         SDOH assessments and interventions completed:  Yes     Care Coordination Interventions Activated:  Yes  Care Coordination Interventions:  Yes, provided   Follow up plan: No further intervention required.   Encounter Outcome:  Pt. Visit Completed   Lenor Derrick , MSW Social Worker [facility]  [phone]

## 2022-08-04 NOTE — Progress Notes (Signed)
[name], [name] ([id number]) Visit Report for 08/02/2022 Arrival Information Details Patient Name: Date of Service: [place], [name] 08/02/2022 3:45 PM Medical Record Number: [id number] Patient Account Number: [id number] Date of Birth/Sex: Treating RN: 19-Apr-1951 (71 y.o. [name] Primary Care Suraj Ramdass: Jim Like Other Clinician: Referring Dontae Minerva: Treating Daveyon Kitchings/Extender: Rory Percy in Treatment: 36 Visit Information History Since Last Visit Added or deleted any medications: No Patient Arrived: Wheel Chair Any new allergies or adverse reactions: No Arrival Time: 15:50 Had a fall or experienced change in No Accompanied By: family activities of daily living that may affect Transfer Assistance: EasyPivot Patient Lift risk of falls: Patient Identification Verified: Yes Signs or symptoms of abuse/neglect since last visito No Secondary Verification Process Completed: Yes Hospitalized since last visit: No Patient Requires Transmission-Based Precautions: No Implantable device outside of the clinic excluding No Patient Has Alerts: Yes cellular tissue based products placed in the center Patient Alerts: Patient on Blood Thinner since last visit: pacemaker Has Dressing in Place as Prescribed: Yes Pain Present Now: Yes Electronic Signature(s) Signed: 08/04/2022 8:28:11 AM By: Sandre Kitty Entered By: Sandre Kitty on 08/02/2022 15:50:41 -------------------------------------------------------------------------------- Encounter Discharge Information Details Patient Name: Date of Service: [name], [name] 08/02/2022 3:45 PM Medical Record Number: [id number] Patient Account Number: [id number] Date of Birth/Sex: Treating RN: [birth year] (71 y.o. [name] Primary Care Wisam Siefring: Jim Like Other Clinician: Referring Jennings Stirling: Treating Joseff Luckman/Extender: Rory Percy in Treatment:  17 Encounter Discharge Information Items Post Procedure Vitals Discharge Condition: Stable Temperature (F): 98.5 Ambulatory Status: Wheelchair Pulse (bpm): 45 Discharge Destination: Home Respiratory Rate (breaths/min): 20 Transportation: Private Auto Blood Pressure (mmHg): 140/60 Accompanied By: self Schedule Follow-up Appointment: Yes Clinical Summary of Care: Patient Declined Electronic Signature(s) Signed: 08/02/2022 5:04:31 PM By: Adline Peals Entered By: Adline Peals on 08/02/2022 16:39:51 -------------------------------------------------------------------------------- Lower Extremity Assessment Details Patient Name: Date of Service: [name], [name] 08/02/2022 3:45 PM Medical Record Number: [id number] Patient Account Number: [id number] Date of Birth/Sex: Treating RN: 05-21-1951 (71 y.o. [name] Primary Care Chinara Hertzberg: Jim Like Other Clinician: Referring Idy Rawling: Treating Yocelyn Brocious/Extender: Rory Percy in Treatment: 17 Edema Assessment Assessed: Shirlyn Goltz: No] [Right: No] E[Left: dema] [Right: :] Calf Left: Right: Point of Measurement: From Medial Instep 38.4 cm 35.2 cm Ankle Left: Right: Point of Measurement: From Medial Instep 23.7 cm 22.3 cm Electronic Signature(s) Signed: 08/02/2022 5:04:31 PM By: Adline Peals Entered By: Adline Peals on 08/02/2022 16:02:14 -------------------------------------------------------------------------------- Multi Wound Chart Details Patient Name: Date of Service: [name], [name] 08/02/2022 3:45 PM Medical Record Number: [id number] Patient Account Number: [id number] Date of Birth/Sex: Treating RN: 1951-10-14 (71 y.o. 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 N/A Primary Etiology: Extremity Cataracts, Anemia, Sleep Apnea, Cataracts, Anemia, Sleep Apnea, N/A Comorbid History: Congestive Heart Failure, Congestive Heart Failure, Hypertension, Type I Diabetes, Hypertension, Type I Diabetes, Received Radiation Received Radiation 06/16/2022 07/12/2022 N/A Date Acquired: 4 3 N/A Weeks of Treatment: Open Open N/A Wound Status: No No N/A Wound Recurrence: 0.5x0.6x0.1 0.1x0.1x0.1 N/A Measurements L x W x D (cm) 0.236 0.008 N/A A (cm) : rea  0.024 0.001 N/A Volume (cm) : 85.70% 97.60% N/A % Reduction in A rea: 85.50% 97.00% N/A % Reduction in Volume: Grade 2 Category/Stage II N/A Classification: Medium Medium N/A Exudate A mount: Serosanguineous Serosanguineous N/A Exudate Type: red, brown red, brown N/A Exudate Color: Distinct, outline attached Distinct, outline attached N/A Wound Margin: Medium (34-66%) Large (67-100%) N/A Granulation A mount: Red Red N/A Granulation Quality: Medium (34-66%) Small (1-33%) N/A Necrotic A mount: Fat Layer (Subcutaneous Tissue): Yes Fat Layer (Subcutaneous Tissue): Yes N/A Exposed Structures: Fascia: No Fascia: No Tendon: No Tendon: No Muscle: No Muscle: No Joint: No Joint: No Bone: No Bone: No Small (1-33%) None N/A Epithelialization: N/A Debridement - Selective/Open Wound N/A Debridement: Pre-procedure Verification/Time Out N/A 16:14 N/A Taken: N/A Lidocaine 4% Topical Solution N/A Pain Control: N/A Necrotic/Eschar N/A Tissue Debrided: N/A Non-Viable Tissue N/A Level: N/A 0.09 N/A Debridement A (sq cm): rea N/A Curette N/A Instrument: N/A Minimum N/A Bleeding: N/A Pressure N/A Hemostasis A  chieved: N/A 0 N/A Procedural Pain: N/A 0 N/A Post Procedural Pain: N/A Procedure was tolerated well N/A Debridement Treatment Response: N/A 0.1x0.1x0.1 N/A Post Debridement Measurements L x W x D (cm) N/A 0.001 N/A Post Debridement Volume: (cm) N/A Category/Stage II N/A Post Debridement Stage: N/A Debridement N/A Procedures Performed: Treatment Notes Electronic Signature(s) Signed: 08/02/2022 4:24:04 PM By: Fredirick Maudlin MD FACS Signed: 08/02/2022 5:04:31 PM By: Adline Peals Entered By: Fredirick Maudlin on 08/02/2022 16:24:03 -------------------------------------------------------------------------------- Multi-Disciplinary Care Plan Details Patient Name: Date of Service: [name] [place] [name] 08/02/2022 3:45 PM Medical Record Number: [id number] Patient Account Number: [id number] Date of Birth/Sex: Treating RN: 1951/06/10 (71 y.o. [name] Primary Care Jolanda Mccann: Jim Like Other Clinician: Referring Taleeya Blondin: Treating Darrell Hauk/Extender: Rory Percy in Treatment: [place] reviewed with physician Active Inactive Abuse / Safety / Falls / Self Care Management Nursing Diagnoses: History of Falls Impaired physical mobility Goals: Patient/caregiver will verbalize/demonstrate measures taken to prevent injury and/or falls Date Initiated: 04/03/2022 Target Resolution Date: 09/22/2022 Goal Status: Active Interventions: Assess fall risk on admission and as needed Assess: immobility, friction, shearing, incontinence upon admission and as needed Assess impairment of mobility on admission and as needed per policy Notes: Nutrition Nursing Diagnoses: Impaired glucose control: actual or potential Potential for alteratiion in Nutrition/Potential for imbalanced nutrition Goals: Patient/caregiver will maintain therapeutic glucose control Date Initiated: 04/03/2022 Target Resolution Date: 09/22/2022 Goal Status:  Active Interventions: Assess HgA1c results as ordered upon admission and as needed Provide education on elevated blood sugars and impact on wound healing Treatment Activities: Patient referred to Primary Care Physician for further nutritional evaluation : 04/03/2022 Notes: Venous Leg Ulcer Nursing Diagnoses: Knowledge deficit related to disease process and management Potential for venous Insuffiency (use before diagnosis confirmed) Goals: Patient will maintain optimal edema control Date Initiated: 04/03/2022 Target Resolution Date: 09/22/2022 Goal Status: Active Interventions: Assess peripheral edema status every visit. Compression as ordered Provide education on venous insufficiency Treatment Activities: Therapeutic compression applied : 04/03/2022 Notes: Electronic Signature(s) Signed: 08/02/2022 5:04:31 PM By: Adline Peals Entered By: Adline Peals on 08/02/2022 16:02:58 -------------------------------------------------------------------------------- Pain Assessment Details Patient Name: Date of Service: [name], [name] 08/02/2022 3:45 PM Medical Record Number: [id number] Patient Account Number: [id number] Date of Birth/Sex: Treating RN: 02-01-1951 (71 y.o. [name] Primary Care Oris Staffieri: Jim Like Other Clinician: Referring Johnie Stadel: Treating Deniz Eskridge/Extender: Rory Percy in Treatment: 17 Active Problems Location of Pain Severity and Description of Pain Patient Has Paino Yes Site Locations Rate  the pain. Current Pain Level: 4 Pain Management and Medication Current Pain Management: Electronic Signature(s) Signed: 08/02/2022 5:04:31 PM By: Adline Peals Signed: 08/04/2022 8:28:11 AM By: Sandre Kitty Entered By: Sandre Kitty on 08/02/2022 15:51:12 -------------------------------------------------------------------------------- Patient/Caregiver Education Details Patient Name: Date of  Service: Jacqulyn Ducking 8/9/2023andnbsp3:45 PM Medical Record Number: [id number] Patient Account Number: [id number] Date of Birth/Gender: Treating RN: [birth year] (71 y.o. [name] Primary Care Physician: Jim Like Other Clinician: Referring Physician: Treating Physician/Extender: Rory Percy in Treatment: 31 Education Assessment Education Provided To: Patient Education Topics Provided Wound/Skin Impairment: Methods: Explain/Verbal Responses: Reinforcements needed, State content correctly Electronic Signature(s) Signed: 08/02/2022 5:04:31 PM By: Adline Peals Entered By: Adline Peals on 08/02/2022 16:03:08 -------------------------------------------------------------------------------- Wound Assessment Details Patient Name: Date of Service: [name], [name] 08/02/2022 3:45 PM Medical Record Number: [id number] Patient Account Number: [id number] Date of Birth/Sex: Treating RN: Jul 08, 1951 (71 y.o. [name] Primary Care Mitali Shenefield: Jim Like Other Clinician: Referring Renaldo Gornick: Treating Burris Matherne/Extender: Rory Percy in Treatment: 17 Wound Status Wound Number: 11 Primary Diabetic Wound/Ulcer of the Lower Extremity Etiology: Wound Location: Foot Wound Open Wounding Event: Pressure Injury Status: Date Acquired: 06/16/2022 Comorbid Cataracts, Anemia, Sleep Apnea, Congestive Heart Failure, Weeks Of Treatment: 4 History: Hypertension, Type I Diabetes, Received Radiation Clustered Wound: No Photos Wound Measurements Length: (cm) 0.5 Width: (cm) 0.6 Depth: (cm) 0.1 Area: (cm) 0.236 Volume: (cm) 0.024 % Reduction in Area: 85.7% % Reduction in Volume: 85.5% Epithelialization: Small (1-33%) Wound Description Classification: Grade 2 Wound Margin: Distinct, outline attached Exudate Amount: Medium Exudate Type: Serosanguineous Exudate Color: red, brown Foul Odor After  Cleansing: No Slough/Fibrino Yes Wound Bed Granulation Amount: Medium (34-66%) Exposed Structure Granulation Quality: Red Fascia Exposed: No Necrotic Amount: Medium (34-66%) Fat Layer (Subcutaneous Tissue) Exposed: Yes Necrotic Quality: Adherent Slough Tendon Exposed: No Muscle Exposed: No Joint Exposed: No Bone Exposed: No Treatment Notes Wound #11 (Foot) Cleanser Soap and Water Discharge Instruction: May shower and wash wound with dial antibacterial soap and water prior to dressing change. Wound Cleanser Discharge Instruction: Cleanse the wound with wound cleanser prior to applying a clean dressing using gauze sponges, not tissue or cotton balls. Peri-Wound Care Topical Primary Dressing KerraCel Ag Gelling Fiber Dressing, 2x2 in (silver alginate) Discharge Instruction: Apply silver alginate to wound bed as instructed Secondary Dressing Optifoam Non-Adhesive Dressing, 4x4 in Discharge Instruction: Apply over primary dressing as directed. Woven Gauze Sponge, Non-Sterile 4x4 in Discharge Instruction: Apply over primary dressing as directed. Secured With [employer], 4.5x3.1 (in/yd) Discharge Instruction: Secure with Kerlix as directed. 45M Medipore Soft Cloth Surgical T 2x10 (in/yd) ape Discharge Instruction: Secure with tape as directed. Compression Wrap Compression Stockings Add-Ons Electronic Signature(s) Signed: 08/02/2022 5:04:31 PM By: Adline Peals Signed: 08/04/2022 8:28:11 AM By: Sandre Kitty Entered By: Sandre Kitty on 08/02/2022 15:56:55 -------------------------------------------------------------------------------- Wound Assessment Details Patient Name: Date of Service: [name], [name] 08/02/2022 3:45 PM Medical Record Number: [id number] Patient Account Number: [id number] Date of Birth/Sex: Treating RN: 11/17/1951 (71 y.o. [name] Primary Care Neetu Carrozza: Jim Like Other Clinician: Referring Madelena Maturin: Treating  Odester Nilson/Extender: Rory Percy in Treatment: 17 Wound Status Wound Number: 12 Primary Pressure Ulcer Etiology: Wound Location: Right T Great oe Wound Open Wounding Event: Pressure Injury Status: Date Acquired: 07/12/2022 Comorbid Cataracts, Anemia, Sleep Apnea, Congestive Heart Failure, Weeks Of Treatment: 3 History: Hypertension, Type I Diabetes, Received Radiation Clustered Wound: No Photos Wound Measurements Length: (cm) 0.1 Width: (cm)  0.1 Depth: (cm) 0.1 Area: (cm) 0.008 Volume: (cm) 0.001 % Reduction in Area: 97.6% % Reduction in Volume: 97% Epithelialization: None Wound Description Classification: Category/Stage II Wound Margin: Distinct, outline attached Exudate Amount: Medium Exudate Type: Serosanguineous Exudate Color: red, brown Foul Odor After Cleansing: No Slough/Fibrino Yes Wound Bed Granulation Amount: Large (67-100%) Exposed Structure Granulation Quality: Red Fascia Exposed: No Necrotic Amount: Small (1-33%) Fat Layer (Subcutaneous Tissue) Exposed: Yes Necrotic Quality: Adherent Slough Tendon Exposed: No Muscle Exposed: No Joint Exposed: No Bone Exposed: No Treatment Notes Wound #12 (Toe Great) Wound Laterality: Right Cleanser Soap and Water Discharge Instruction: May shower and wash wound with dial antibacterial soap and water prior to dressing change. Wound Cleanser Discharge Instruction: Cleanse the wound with wound cleanser prior to applying a clean dressing using gauze sponges, not tissue or cotton balls. Peri-Wound Care Topical Primary Dressing KerraCel Ag Gelling Fiber Dressing, 2x2 in (silver alginate) Discharge Instruction: Apply silver alginate to wound bed as instructed Secondary Dressing Woven Gauze Sponges 2x2 in Discharge Instruction: Apply over primary dressing as directed. Secured With Conforming Stretch Gauze Bandage, Sterile 2x75 (in/in) Discharge Instruction: Secure with stretch gauze as  directed. Compression Wrap Compression Stockings Add-Ons Electronic Signature(s) Signed: 08/02/2022 5:04:31 PM By: Adline Peals Signed: 08/04/2022 8:28:11 AM By: Sandre Kitty Entered By: Sandre Kitty on 08/02/2022 15:55:38 -------------------------------------------------------------------------------- Vitals Details Patient Name: Date of Service: [name], [name] 08/02/2022 3:45 PM Medical Record Number: [id number] Patient Account Number: [id number] Date of Birth/Sex: Treating RN: 01/05/1951 (71 y.o. [name] Primary Care Alajah Witman: Jim Like Other Clinician: Referring Icholas Irby: Treating Meridian Scherger/Extender: Rory Percy in Treatment: 17 Vital Signs Time Taken: 15:50 Temperature (F): 98.5 Height (in): 67 Pulse (bpm): 46 Weight (lbs): 243 Respiratory Rate (breaths/min): 20 Body Mass Index (BMI): 38.1 Blood Pressure (mmHg): 140/60 Reference Range: 80 - 120 mg / dl Electronic Signature(s) Signed: 08/04/2022 8:28:11 AM By: Sandre Kitty Entered By: Sandre Kitty on 08/02/2022 15:51:03

## 2022-08-07 DIAGNOSIS — E1065 Type 1 diabetes mellitus with hyperglycemia: Secondary | ICD-10-CM | POA: Diagnosis not present

## 2022-08-16 ENCOUNTER — Encounter (HOSPITAL_BASED_OUTPATIENT_CLINIC_OR_DEPARTMENT_OTHER): Payer: Medicare PPO | Admitting: General Surgery

## 2022-08-16 DIAGNOSIS — E1042 Type 1 diabetes mellitus with diabetic polyneuropathy: Secondary | ICD-10-CM | POA: Diagnosis not present

## 2022-08-16 DIAGNOSIS — L97512 Non-pressure chronic ulcer of other part of right foot with fat layer exposed: Secondary | ICD-10-CM | POA: Diagnosis not present

## 2022-08-16 DIAGNOSIS — I5032 Chronic diastolic (congestive) heart failure: Secondary | ICD-10-CM | POA: Diagnosis not present

## 2022-08-16 DIAGNOSIS — L97422 Non-pressure chronic ulcer of left heel and midfoot with fat layer exposed: Secondary | ICD-10-CM | POA: Diagnosis not present

## 2022-08-16 DIAGNOSIS — E1065 Type 1 diabetes mellitus with hyperglycemia: Secondary | ICD-10-CM | POA: Diagnosis not present

## 2022-08-16 DIAGNOSIS — I110 Hypertensive heart disease with heart failure: Secondary | ICD-10-CM | POA: Diagnosis not present

## 2022-08-16 DIAGNOSIS — L97522 Non-pressure chronic ulcer of other part of left foot with fat layer exposed: Secondary | ICD-10-CM | POA: Diagnosis not present

## 2022-08-16 DIAGNOSIS — I890 Lymphedema, not elsewhere classified: Secondary | ICD-10-CM | POA: Diagnosis not present

## 2022-08-16 DIAGNOSIS — E10621 Type 1 diabetes mellitus with foot ulcer: Secondary | ICD-10-CM | POA: Diagnosis not present

## 2022-08-16 DIAGNOSIS — E10622 Type 1 diabetes mellitus with other skin ulcer: Secondary | ICD-10-CM | POA: Diagnosis not present

## 2022-08-16 DIAGNOSIS — E1059 Type 1 diabetes mellitus with other circulatory complications: Secondary | ICD-10-CM | POA: Diagnosis not present

## 2022-08-17 ENCOUNTER — Ambulatory Visit: Payer: Medicare PPO

## 2022-08-21 NOTE — Progress Notes (Signed)
[name] ([id number]) Visit Report for 08/16/2022 Arrival Information Details Patient Name: Date of Service: [name], [name] 08/16/2022 3:45 PM Medical Record Number: [id number] Patient Account Number: [id number] Date of Birth/Sex: Treating RN: [birth year] (71 y.o. [name] Primary Care Jerzey Komperda: Jim Like Other Clinician: Referring Symone Cornman: Treating Safa Derner/Extender: Rory Percy in Treatment: 68 Visit Information History Since Last Visit Added or deleted any medications: No Patient Arrived: Wheel Chair Any new allergies or adverse reactions: No Arrival Time: 15:54 Had a fall or experienced change in No Accompanied By: self activities of daily living that may affect Transfer Assistance: Manual risk of falls: Patient Identification Verified: Yes Signs or symptoms of abuse/neglect since last visito No Secondary Verification Process Completed: Yes Hospitalized since last visit: No Patient Requires Transmission-Based Precautions: No Implantable device outside of the clinic excluding No Patient Has Alerts: Yes cellular tissue based products placed in the center Patient Alerts: Patient on Blood Thinner since last visit: pacemaker Has Dressing in Place as Prescribed: Yes Pain Present Now: No Electronic Signature(s) Signed: 08/17/2022 10:35:58 AM By: Erenest Blank Entered By: Erenest Blank on 08/16/2022 15:54:43 -------------------------------------------------------------------------------- Encounter Discharge Information Details Patient Name: Date of Service: [name], [name] [name] [name] 08/16/2022 3:45 PM Medical Record Number: [id number] Patient Account Number: [id number] Date of Birth/Sex: Treating RN: 10-22-1951 (71 y.o. Marta Lamas Primary Care Brandilee Pies: Jim Like Other Clinician: Referring Kalven Ganim: Treating Raphael Espe/Extender: Rory Percy in Treatment: 19 Encounter Discharge  Information Items Post Procedure Vitals Discharge Condition: Stable Temperature (F): 98.4 Ambulatory Status: Wheelchair Pulse (bpm): 61 Discharge Destination: Home Respiratory Rate (breaths/min): 20 Transportation: Private Auto Blood Pressure (mmHg): 161/68 Accompanied By: self Schedule Follow-up Appointment: Yes Clinical Summary of Care: Electronic Signature(s) Signed: 08/18/2022 7:35:53 AM By: Blanche East RN Entered By: Blanche East on 08/16/2022 17:35:45 -------------------------------------------------------------------------------- Lower Extremity Assessment Details Patient Name: Date of Service: [name], [name] 08/16/2022 3:45 PM Medical Record Number: [id number] Patient Account Number: [id number] Date of Birth/Sex: Treating RN: 05-29-1951 (71 y.o. Marta Lamas Primary Care Mcguire Gasparyan: Jim Like Other Clinician: Referring Keidra Withers: Treating Marianela Mandrell/Extender: Rory Percy in Treatment: 19 Edema Assessment Assessed: Shirlyn Goltz: No] [Right: No] E[Left: dema] [Right: :] Calf Left: Right: Point of Measurement: From Medial Instep 38.4 cm 35.2 cm Ankle Left: Right: Point of Measurement: From Medial Instep 23.7 cm 22.3 cm Vascular Assessment Pulses: Dorsalis Pedis Palpable: [Left:Yes] [Right:Yes] Electronic Signature(s) Signed: 08/18/2022 7:35:53 AM By: Blanche East RN Entered By: Blanche East on 08/16/2022 16:12:56 -------------------------------------------------------------------------------- Multi Wound Chart Details Patient Name: Date of Service: [name] [name] [name] 08/16/2022 3:45 PM Medical Record Number: [id number] Patient Account Number: [id number] Date of Birth/Sex: Treating RN: [birth year] (70 y.o. [name] Primary Care Ysabela Keisler: Jim Like Other Clinician: Referring Denece Shearer: Treating Jolleen Seman/Extender: Rory Percy in Treatment: 19 Vital Signs Height(in): 38 Pulse(bpm):  30 Weight(lbs): 243 Blood Pressure(mmHg): 161/68 Body Mass Index(BMI): 38.1 Temperature(F): 98.4 Respiratory Rate(breaths/min): 20 Photos: [11:Foot] [12:Right T Great oe] [N/A:N/A N/A] Wound Location: [11:Pressure Injury] [12:Pressure Injury] [N/A:N/A] Wounding Event: [11:Diabetic Wound/Ulcer of the Lower] [12:Pressure Ulcer] [N/A:N/A] Primary Etiology: [11:Extremity Cataracts, Anemia, Sleep Apnea,] [12:Cataracts, Anemia, Sleep Apnea,] [N/A:N/A] Comorbid History: [11:Congestive Heart Failure, Hypertension, Type I Diabetes, Received Radiation 06/16/2022] [12:Congestive Heart Failure, Hypertension, Type I Diabetes, Received Radiation 07/12/2022] [N/A:N/A] Date Acquired: [11:6] [12:5] [N/A:N/A] Weeks of Treatment: [11:Open] [12:Open] [N/A:N/A] Wound Status: [11:No] [12:No] [N/A:N/A] Wound Recurrence: [11:0.6x0.7x0.1] [12:0x0x0] [N/A:N/A] Measurements L x W x D (cm) [11:0.33] [  12:0] [N/A:N/A] A (cm) : rea [11:0.033] [12:0] [N/A:N/A] Volume (cm) : [11:80.00%] [12:100.00%] [N/A:N/A] % Reduction in A [11:rea: 80.00%] [12:100.00%] [N/A:N/A] % Reduction in Volume: [11:Grade 2] [12:Category/Stage II] [N/A:N/A] Classification: [11:Medium] [12:Medium] [N/A:N/A] Exudate A mount: [11:Serosanguineous] [12:Serosanguineous] [N/A:N/A] Exudate Type: [11:red, brown] [12:red, brown] [N/A:N/A] Exudate Color: [11:Distinct, outline attached] [12:Distinct, outline attached] [N/A:N/A] Wound Margin: [11:Medium (34-66%)] [12:None Present (0%)] [N/A:N/A] Granulation A mount: [11:Red] [12:N/A] [N/A:N/A] Granulation Quality: [11:Medium (34-66%)] [12:None Present (0%)] [N/A:N/A] Necrotic A mount: [11:Fat Layer (Subcutaneous Tissue): Yes Fascia: No] [N/A:N/A] Exposed Structures: [11:Fascia: No Tendon: No Muscle: No Joint: No Bone: No Small (1-33%)] [12:Fat Layer (Subcutaneous Tissue): No Tendon: No Muscle: No Joint: No Bone: No Large (67-100%)] [N/A:N/A] Epithelialization: [11:Debridement - Selective/Open Wound  N/A] [N/A:N/A] Debridement: Pre-procedure Verification/Time Out 16:17 [12:N/A] [N/A:N/A] Taken: [11:Lidocaine 5% topical ointment] [12:N/A] [N/A:N/A] Pain Control: [11:Necrotic/Eschar, Slough] [12:N/A] [N/A:N/A] Tissue Debrided: [11:Non-Viable Tissue] [12:N/A] [N/A:N/A] Level: [11:0.42] [12:N/A] [N/A:N/A] Debridement A (sq cm): [11:rea Curette] [12:N/A] [N/A:N/A] Instrument: [11:Minimum] [12:N/A] [N/A:N/A] Bleeding: [11:Pressure] [12:N/A] [N/A:N/A] Hemostasis A chieved: [11:0] [12:N/A] [N/A:N/A] Procedural Pain: [11:0] [12:N/A] [N/A:N/A] Post Procedural Pain: [11:Procedure was tolerated well] [12:N/A] [N/A:N/A] Debridement Treatment Response: [11:0.6x0.4x0.1] [12:N/A] [N/A:N/A] Post Debridement Measurements L x W x D (cm) [11:0.019] [12:N/A] [N/A:N/A] Post Debridement Volume: (cm) [11:Debridement] [12:N/A] [N/A:N/A] Treatment Notes Electronic Signature(s) Signed: 08/16/2022 4:41:55 PM By: Fredirick Maudlin MD FACS Signed: 08/21/2022 5:02:03 PM By: Adline Peals Entered By: Fredirick Maudlin on 08/16/2022 16:41:55 -------------------------------------------------------------------------------- Multi-Disciplinary Care Plan Details Patient Name: Date of Service: [name] [name] 08/16/2022 3:45 PM Medical Record Number: [id number] Patient Account Number: [id number] Date of Birth/Sex: Treating RN: 26-Dec-1950 (71 y.o. Iver Nestle, Jamie Primary Care Kathaleen Dudziak: Jim Like Other Clinician: Referring Weaver Tweed: Treating Laurieanne Galloway/Extender: Rory Percy in Treatment: [place] reviewed with physician Active Inactive Abuse / Safety / Falls / Self Care Management Nursing Diagnoses: History of Falls Impaired physical mobility Goals: Patient/caregiver will verbalize/demonstrate measures taken to prevent injury and/or falls Date Initiated: 04/03/2022 Target Resolution Date: 09/22/2022 Goal Status: Active Interventions: Assess fall risk  on admission and as needed Assess: immobility, friction, shearing, incontinence upon admission and as needed Assess impairment of mobility on admission and as needed per policy Notes: Nutrition Nursing Diagnoses: Impaired glucose control: actual or potential Potential for alteratiion in Nutrition/Potential for imbalanced nutrition Goals: Patient/caregiver will maintain therapeutic glucose control Date Initiated: 04/03/2022 Target Resolution Date: 09/22/2022 Goal Status: Active Interventions: Assess HgA1c results as ordered upon admission and as needed Provide education on elevated blood sugars and impact on wound healing Treatment Activities: Patient referred to Primary Care Physician for further nutritional evaluation : 04/03/2022 Notes: Venous Leg Ulcer Nursing Diagnoses: Knowledge deficit related to disease process and management Potential for venous Insuffiency (use before diagnosis confirmed) Goals: Patient will maintain optimal edema control Date Initiated: 04/03/2022 Target Resolution Date: 09/22/2022 Goal Status: Active Interventions: Assess peripheral edema status every visit. Compression as ordered Provide education on venous insufficiency Treatment Activities: Therapeutic compression applied : 04/03/2022 Notes: Electronic Signature(s) Signed: 08/18/2022 7:35:53 AM By: Blanche East RN Entered By: Blanche East on 08/16/2022 16:13:11 -------------------------------------------------------------------------------- Pain Assessment Details Patient Name: Date of Service: Jacqulyn Ducking. 08/16/2022 3:45 PM Medical Record Number: [id number] Patient Account Number: [id number] Date of Birth/Sex: Treating RN: 1951-02-03 (71 y.o. [name] Primary Care Brown Dunlap: Jim Like Other Clinician: Referring Anzleigh Slaven: Treating Sayla Golonka/Extender: Rory Percy in Treatment: 19 Active Problems Location of Pain Severity and Description of  Pain Patient Has Paino No  Site Locations Pain Management and Medication Current Pain Management: Electronic Signature(s) Signed: 08/17/2022 10:35:58 AM By: Erenest Blank Signed: 08/21/2022 5:02:03 PM By: Sabas Sous By: Erenest Blank on 08/16/2022 15:55:26 -------------------------------------------------------------------------------- Patient/Caregiver Education Details Patient Name: Date of Service: Jacqulyn Ducking 8/23/2023andnbsp3:45 PM Medical Record Number: [id number] Patient Account Number: [id number] Date of Birth/Gender: Treating RN: 08-10-1951 (71 y.o. Marta Lamas Primary Care Physician: Jim Like Other Clinician: Referring Physician: Treating Physician/Extender: Rory Percy in Treatment: 19 Education Assessment Education Provided To: Patient Education Topics Provided Elevated Blood Sugar/ Impact on Healing: Methods: Explain/Verbal Responses: Reinforcements needed, State content correctly Venous: Methods: Explain/Verbal Responses: Reinforcements needed, State content correctly Electronic Signature(s) Signed: 08/18/2022 7:35:53 AM By: Blanche East RN Entered By: Blanche East on 08/16/2022 16:13:32 -------------------------------------------------------------------------------- Wound Assessment Details Patient Name: Date of Service: [name], [name] [name] [name] 08/16/2022 3:45 PM Medical Record Number: [id number] Patient Account Number: [id number] Date of Birth/Sex: Treating RN: 12/10/1951 (71 y.o. [name] Primary Care Luvia Orzechowski: Jim Like Other Clinician: Referring Vasilios Ottaway: Treating Dewayne Jurek/Extender: Rory Percy in Treatment: 19 Wound Status Wound Number: 11 Primary Diabetic Wound/Ulcer of the Lower Extremity Etiology: Wound Location: Foot Wound Open Wounding Event: Pressure Injury Status: Date Acquired: 06/16/2022 Comorbid Cataracts, Anemia, Sleep Apnea,  Congestive Heart Failure, Weeks Of Treatment: 6 History: Hypertension, Type I Diabetes, Received Radiation Clustered Wound: No Photos Wound Measurements Length: (cm) 0.6 Width: (cm) 0.7 Depth: (cm) 0.1 Area: (cm) 0.33 Volume: (cm) 0.033 % Reduction in Area: 80% % Reduction in Volume: 80% Epithelialization: Small (1-33%) Tunneling: No Undermining: No Wound Description Classification: Grade 2 Wound Margin: Distinct, outline attached Exudate Amount: Medium Exudate Type: Serosanguineous Exudate Color: red, brown Foul Odor After Cleansing: No Slough/Fibrino Yes Wound Bed Granulation Amount: Medium (34-66%) Exposed Structure Granulation Quality: Red Fascia Exposed: No Necrotic Amount: Medium (34-66%) Fat Layer (Subcutaneous Tissue) Exposed: Yes Necrotic Quality: Adherent Slough Tendon Exposed: No Muscle Exposed: No Joint Exposed: No Bone Exposed: No Electronic Signature(s) Signed: 08/17/2022 10:35:58 AM By: Erenest Blank Signed: 08/21/2022 5:02:03 PM By: Adline Peals Entered By: Erenest Blank on 08/16/2022 16:05:20 -------------------------------------------------------------------------------- Wound Assessment Details Patient Name: Date of Service: [name], [name] [name] [name] 08/16/2022 3:45 PM Medical Record Number: [id number] Patient Account Number: [id number] Date of Birth/Sex: Treating RN: 1951-01-31 (71 y.o. [name] Primary Care Madell Heino: Jim Like Other Clinician: Referring Shaneil Yazdi: Treating Garett Tetzloff/Extender: Rory Percy in Treatment: 19 Wound Status Wound Number: 12 Primary Pressure Ulcer Etiology: Wound Location: Right T Great oe Wound Open Wounding Event: Pressure Injury Status: Date Acquired: 07/12/2022 Comorbid Cataracts, Anemia, Sleep Apnea, Congestive Heart Failure, Weeks Of Treatment: 5 History: Hypertension, Type I Diabetes, Received Radiation Clustered Wound: No Photos Wound  Measurements Length: (cm) Width: (cm) Depth: (cm) Area: (cm) Volume: (cm) 0 % Reduction in Area: 100% 0 % Reduction in Volume: 100% 0 Epithelialization: Large (67-100%) 0 Tunneling: No 0 Undermining: No Wound Description Classification: Category/Stage II Wound Margin: Distinct, outline attached Exudate Amount: Medium Exudate Type: Serosanguineous Exudate Color: red, brown Foul Odor After Cleansing: No Slough/Fibrino Yes Wound Bed Granulation Amount: None Present (0%) Exposed Structure Necrotic Amount: None Present (0%) Fascia Exposed: No Fat Layer (Subcutaneous Tissue) Exposed: No Tendon Exposed: No Muscle Exposed: No Joint Exposed: No Bone Exposed: No Electronic Signature(s) Signed: 08/17/2022 10:35:58 AM By: Erenest Blank Signed: 08/21/2022 5:02:03 PM By: Adline Peals Entered By: Erenest Blank on 08/16/2022 16:08:23 -------------------------------------------------------------------------------- Vitals Details Patient Name: Date of Service: Abila, [name] [name] [name]  08/16/2022 3:45 PM Medical Record Number: [id number] Patient Account Number: [id number] Date of Birth/Sex: Treating RN: 21-Oct-1951 (71 y.o. [name] Primary Care Amazing Cowman: Jim Like Other Clinician: Referring Letanya Froh: Treating Marcelis Wissner/Extender: Rory Percy in Treatment: 19 Vital Signs Time Taken: 15:54 Temperature (F): 98.4 Height (in): 67 Pulse (bpm): 61 Weight (lbs): 243 Respiratory Rate (breaths/min): 20 Body Mass Index (BMI): 38.1 Blood Pressure (mmHg): 161/68 Reference Range: 80 - 120 mg / dl Electronic Signature(s) Signed: 08/17/2022 10:35:58 AM By: Erenest Blank Entered By: Erenest Blank on 08/16/2022 15:55:15

## 2022-08-21 NOTE — Progress Notes (Signed)
[name], [name] ([id number]) Visit Report for 08/16/2022 Chief Complaint Document Details Patient Name: Date of Service: [name], [name] 08/16/2022 3:45 PM Medical Record Number: [id number] Patient Account Number: [id number] Date of Birth/Sex: Treating RN: 09-24-1951 (71 y.o. [name] Primary Care Provider: Jim Like Other Clinician: Referring Provider: Treating Provider/Extender: Rory Percy in Treatment: 19 Information Obtained from: Patient Chief Complaint Patient presents for treatment of open ulcers due to venous insufficiency in the setting of poorly controlled DM1 and CHF Electronic Signature(s) Signed: 08/16/2022 4:42:03 PM By: Fredirick Maudlin MD FACS Entered By: Fredirick Maudlin on 08/16/2022 16:42:03 -------------------------------------------------------------------------------- Debridement Details Patient Name: Date of Service: [name], [name] 08/16/2022 3:45 PM Medical Record Number: [id number] Patient Account Number: [id number] Date of Birth/Sex: Treating RN: [birth year] (71 y.o. Iver Nestle, [place] Primary Care Provider: Jim Like Other Clinician: Referring Provider: Treating Provider/Extender: Rory Percy in Treatment: 19 Debridement Performed for Assessment: Wound #11 Foot Performed By: Physician Fredirick Maudlin, MD Debridement Type: Debridement Severity of Tissue Pre Debridement: Fat layer exposed Level of Consciousness (Pre-procedure): Awake and Alert Pre-procedure Verification/Time Out Yes - 16:17 Taken: Start Time: 16:18 Pain Control: Lidocaine 5% topical ointment T Area Debrided (L x W): otal 0.6 (cm) x 0.7 (cm) = 0.42 (cm) Tissue and other material debrided: Non-Viable, Eschar, Slough, Slough Level: Non-Viable Tissue Debridement Description: Selective/Open Wound Instrument: Curette Bleeding: Minimum Hemostasis Achieved: Pressure Procedural Pain: 0 Post Procedural  Pain: 0 Response to Treatment: Procedure was tolerated well Level of Consciousness (Post- Awake and Alert procedure): Post Debridement Measurements of Total Wound Length: (cm) 0.6 Width: (cm) 0.4 Depth: (cm) 0.1 Volume: (cm) 0.019 Character of Wound/Ulcer Post Debridement: Improved Severity of Tissue Post Debridement: Fat layer exposed Post Procedure Diagnosis Same as Pre-procedure Electronic Signature(s) Signed: 08/16/2022 4:55:45 PM By: Fredirick Maudlin MD FACS Signed: 08/18/2022 7:35:53 AM By: Blanche East RN Entered By: Blanche East on 08/16/2022 16:18:55 -------------------------------------------------------------------------------- HPI Details Patient Name: Date of Service: [name], [name] 08/16/2022 3:45 PM Medical Record Number: [id number] Patient Account Number: [id number] Date of Birth/Sex: Treating RN: 24-Apr-1951 (71 y.o. [name] Primary Care Provider: Jim Like Other Clinician: Referring Provider: Treating Provider/Extender: Rory Percy in Treatment: 19 History of Present Illness HPI Description: ADMISSION 04/03/2022 This is a 71 year old woman with a past medical history notable for type 1 diabetes mellitus, poorly controlled with her most recent A1c being 8.3, congestive heart failure with recent admission for exacerbation secondary to noncompliance with diuretics, as well as history of pontine stroke and oxygen dependence. She has wounds on her bilateral lower extremities. She says they have been present for about 2 months and initially started as blisters. She says that she has compression stockings, but does not wear them because they are too difficult to get on. ABIs done in clinic were 1.4 and 1.5, suggesting some degree of noncompressibility. She also has a wound on her right heel that she says is secondary to it getting bumped frequently as she transfers. She came to clinic with just dry dressings on her legs.  She does have home health care, as well as her sister that have been assisting her with her dressings. She has multiple wounds. On the left medial calf, just above her ankle, there is a somewhat geographic wound with slough and some epithelialization. On the anterior tibial surface and lateral calf, the wounds are quite desiccated with yellow eschar present. On the right anterior tibial surface,  the wounds are gritty with thin slough and eschar. The posterior right ankle wound is just above the calcaneus overlying the Achilles and has thick yellow-gray slough and a bit of odor. No purulent drainage appreciated. 04/10/2022: The wounds on her left leg are closed. On the right, the anterior tibial surface wounds are epithelializing and much cleaner. There is just a layer of biofilm on the open areas. On the posterior right ankle wound, there is a decreased volume of slough and no odor. We have been using Hydrofera Blue to all sites under compression. 04/17/2022: The wounds on her left leg remained closed and she did bring her juxta lite stockings with her today. On the right, the more distal wound is closed and the proximal wound has epithelialized significantly since her last visit. At the posterior right ankle, she still has accumulated some slough and some periwound callus but there is no odor or significant drainage. 04/24/2022: The right anterior leg wounds are closed. She still has a wound on her posterior right ankle that looks about the same as last week. She did not bring her right sided juxta light with her today but is wearing the left sided stocking. 05/01/2022: She has a tiny abrasion on her left anterior tibia which we did not add to her wound count today. The wound on her posterior right ankle is a little bit smaller with a little accumulated slough. No drainage or concern for infection. 05/08/2022: She continues to forget to bring her juxta lite stocking with her so she remains in a compression  wrap despite healing of the right anterior leg wounds. The wound on her posterior right ankle/calcaneus is a bit smaller today with just a little bit of accumulated slough. She is wearing a juxta lite stocking on the left leg. 05/15/2022: She says that she has been wearing her juxta lite stockings at home, but today she is wearing regular compression stockings that she purchased from [employer]; the juxta lite stockings are in the laundry. The wound on her posterior right ankle/calcaneus continues to contract and has just a little bit of accumulated slough. Unfortunately, her edema control is suboptimal on the left and she has developed several small blisters with a larger 1 at the medial calf. 05/23/2022: The patient has been wearing her juxta lite stockings. Unfortunately, she has developed a large blister on her right lower leg just above the ankle. The skin overlying it is still intact. The blister that had opened on her left medial calf is smaller; and the other small blistered sites are closed. The posterior right ankle/calcaneus is about the same today. 06/07/2022: The only remaining open wound is on her posterior right calcaneus. The majority of this is covered with eschar. 06/30/2022: I am not sure why the patient did not return to clinic until today, but the ulcer on her right heel has healed. Unfortunately, she has developed a new ulcer on her left lateral foot. From the position of it, it looks as though the outward rotation of her lower leg at rest is probably causing the lateral midfoot to rub or press against the metal leg rests of her wheelchair. She is not padding the chair when she rests her leg on the leg rests. The new ulcer is fairly superficial with just the fat layer exposed. It is quite tender. No erythema, induration, or purulent drainage identified. 07/12/2022: After our last visit, she was seen by podiatry. Nail care was performed. For some reason he had her apply silicone  toe covers  on her bilateral great toes. As result, the skin is now very macerated and she has a new ulcer on the knuckle of her right great toe. It is fairly small but does expose the fat layer. No erythema induration or purulent drainage although there was some odor on her feet when the caps were removed. The wound on her left lateral foot is smaller with just a small amount of slough buildup. Her Prevalon boots arrived and we will put her in those after her dressing is changed. 08/02/2022: She has been wearing her Prevalon boots to some extent, but says that she spends most of her day in her wheelchair and the boots slide off the foot rest. She apparently has not been utilizing the leg strap. The left lateral foot wound is tender and the tissue has some discoloration suggestive of ongoing pressure in this area. There is a little bit of callus accumulation circumferentially. The wound on her right great toe DIP joint is nearly closed and just has a light layer of eschar overlying the surface. No concern for infection. 08/16/2022: The wound on her right great toe has healed. The wound on her left lateral foot is quite a bit smaller with good perimeter epithelialization and a light layer of slough on the surface. Electronic Signature(s) Signed: 08/16/2022 4:42:42 PM By: Fredirick Maudlin MD FACS Entered By: Fredirick Maudlin on 08/16/2022 16:42:42 -------------------------------------------------------------------------------- Physical Exam Details Patient Name: Date of Service: [place], [name] 08/16/2022 3:45 PM Medical Record Number: [id number] Patient Account Number: [id number] Date of Birth/Sex: Treating RN: Jan 24, 1951 (71 y.o. [name] Primary Care Provider: Jim Like Other Clinician: Referring Provider: Treating Provider/Extender: Rory Percy in Treatment: 19 Constitutional Hypertensive, asymptomatic. . . . No acute distress.Marland Kitchen Respiratory Normal work of  breathing on room air.. Notes 08/16/2022: The wound on her right great toe has healed. The wound on her left lateral foot is quite a bit smaller with good perimeter epithelialization and a light layer of slough on the surface. Electronic Signature(s) Signed: 08/16/2022 4:43:10 PM By: Fredirick Maudlin MD FACS Entered By: Fredirick Maudlin on 08/16/2022 16:43:10 -------------------------------------------------------------------------------- Physician Orders Details Patient Name: Date of Service: [name], [name] 08/16/2022 3:45 PM Medical Record Number: [id number] Patient Account Number: [id number] Date of Birth/Sex: Treating RN: May 06, 1951 (71 y.o. Iver Nestle, Jamie Primary Care Provider: Jim Like Other Clinician: Referring Provider: Treating Provider/Extender: Rory Percy in Treatment: 19 Verbal / Phone Orders: No Diagnosis Coding ICD-10 Coding Code Description I89.0 Lymphedema, not elsewhere classified Y77.41 Chronic diastolic (congestive) heart failure E10.59 Type 1 diabetes mellitus with other circulatory complications O87.86 Type 1 diabetes mellitus with diabetic polyneuropathy Z68.41 Body mass index [BMI]40.0-44.9, adult E10.622 Type 1 diabetes mellitus with other skin ulcer L97.512 Non-pressure chronic ulcer of other part of right foot with fat layer exposed Follow-up Appointments ppointment in 2 weeks. - Dr. Celine Ahr - room 2 - Return A Wednesday at 3:00 PM Anesthetic Wound #11 Foot (In clinic) Topical Lidocaine 5% applied to wound bed Wound #12 Right T Great oe (In clinic) Topical Lidocaine 5% applied to wound bed Bathing/ Shower/ Hygiene May shower and wash wound with soap and water. - with dressing changes Edema Control - Lymphedema / SCD / Other Bilateral Lower Extremities Elevate legs to the level of the heart or above for 30 minutes daily and/or when sitting, a frequency of: - throughout the day Patient to wear own compression  stockings every day. - juxtalites  to both legs Exercise regularly Off-Loading Other: - Prevalon boot to both feet. Please put pillow between foot and foot rest so the foot is not resting on the metal footrest. Home Health Admit to [facility] for wound care. May utilize formulary equivalent dressing for wound treatment orders unless otherwise specified. New wound care orders this week; continue Home Health for wound care. May utilize formulary equivalent dressing for wound treatment orders unless otherwise specified. Other Home Health Orders/Instructions: [facility] Wound Treatment Wound #11 - Foot Cleanser: Soap and Water Every Other Day/15 Days Discharge Instructions: May shower and wash wound with dial antibacterial soap and water prior to dressing change. Cleanser: Wound Cleanser Every Other Day/15 Days Discharge Instructions: Cleanse the wound with wound cleanser prior to applying a clean dressing using gauze sponges, not tissue or cotton balls. Prim Dressing: KerraCel Ag Gelling Fiber Dressing, 2x2 in (silver alginate) (Generic) Every Other Day/15 Days ary Discharge Instructions: Apply silver alginate to wound bed as instructed Secondary Dressing: Optifoam Non-Adhesive Dressing, 4x4 in Every Other Day/15 Days Discharge Instructions: Apply over primary dressing as directed. Secondary Dressing: Woven Gauze Sponge, Non-Sterile 4x4 in Every Other Day/15 Days Discharge Instructions: Apply over primary dressing as directed. Secured With: [employer], 4.5x3.1 (in/yd) Every Other Day/15 Days Discharge Instructions: Secure with Kerlix as directed. Secured With: 30M Medipore Public affairs consultant Surgical T 2x10 (in/yd) Every Other Day/15 Days ape Discharge Instructions: Secure with tape as directed. Wound #12 - T Great oe Wound Laterality: Right Cleanser: Soap and Water Every Other Day/15 Days Discharge Instructions: May shower and wash wound with dial antibacterial soap and water prior to  dressing change. Cleanser: Wound Cleanser Every Other Day/15 Days Discharge Instructions: Cleanse the wound with wound cleanser prior to applying a clean dressing using gauze sponges, not tissue or cotton balls. Prim Dressing: KerraCel Ag Gelling Fiber Dressing, 2x2 in (silver alginate) (Generic) Every Other Day/15 Days ary Discharge Instructions: Apply silver alginate to wound bed as instructed Secondary Dressing: Woven Gauze Sponges 2x2 in Every Other Day/15 Days Discharge Instructions: Apply over primary dressing as directed. Secured With: Child psychotherapist, Sterile 2x75 (in/in) Every Other Day/15 Days Discharge Instructions: Secure with stretch gauze as directed. Electronic Signature(s) Signed: 08/16/2022 4:55:45 PM By: Fredirick Maudlin MD FACS Entered By: Fredirick Maudlin on 08/16/2022 16:43:30 -------------------------------------------------------------------------------- Problem List Details Patient Name: Date of Service: [name], [name] 08/16/2022 3:45 PM Medical Record Number: [id number] Patient Account Number: [id number] Date of Birth/Sex: Treating RN: Dec 08, 1951 (71 y.o. [name] Primary Care Provider: Jim Like Other Clinician: Referring Provider: Treating Provider/Extender: Rory Percy in Treatment: 19 Active Problems ICD-10 Encounter Code Description Active Date MDM Diagnosis I89.0 Lymphedema, not elsewhere classified 04/03/2022 No Yes Z00.17 Chronic diastolic (congestive) heart failure 04/03/2022 No Yes E10.59 Type 1 diabetes mellitus with other circulatory complications 4/94/4967 No Yes E10.42 Type 1 diabetes mellitus with diabetic polyneuropathy 04/03/2022 No Yes Z68.41 Body mass index [BMI]40.0-44.9, adult 04/03/2022 No Yes E10.622 Type 1 diabetes mellitus with other skin ulcer 04/03/2022 No Yes L97.512 Non-pressure chronic ulcer of other part of right foot with fat layer exposed 07/12/2022 No  Yes Inactive Problems ICD-10 Code Description Active Date Inactive Date L97.212 Non-pressure chronic ulcer of right calf with fat layer exposed 04/03/2022 04/03/2022 L97.222 Non-pressure chronic ulcer of left calf with fat layer exposed 04/03/2022 04/03/2022 L97.312 Non-pressure chronic ulcer of right ankle with fat layer exposed 04/03/2022 04/03/2022 L97.821 Non-pressure chronic ulcer of other part of left lower leg  limited to breakdown of skin 05/15/2022 05/15/2022 L97.811 Non-pressure chronic ulcer of other part of right lower leg limited to breakdown of skin 05/23/2022 05/23/2022 L97.422 Non-pressure chronic ulcer of left heel and midfoot with fat layer exposed 06/30/2022 06/30/2022 Resolved Problems Electronic Signature(s) Signed: 08/16/2022 4:41:46 PM By: Fredirick Maudlin MD FACS Entered By: Fredirick Maudlin on 08/16/2022 16:41:46 -------------------------------------------------------------------------------- Progress Note Details Patient Name: Date of Service: [name], [name] 08/16/2022 3:45 PM Medical Record Number: [id number] Patient Account Number: [id number] Date of Birth/Sex: Treating RN: 1951-05-21 (71 y.o. [name] Primary Care Provider: Jim Like Other Clinician: Referring Provider: Treating Provider/Extender: Rory Percy in Treatment: 19 Subjective Chief Complaint Information obtained from Patient Patient presents for treatment of open ulcers due to venous insufficiency in the setting of poorly controlled DM1 and CHF History of Present Illness (HPI) ADMISSION 04/03/2022 This is a 71 year old woman with a past medical history notable for type 1 diabetes mellitus, poorly controlled with her most recent A1c being 8.3, congestive heart failure with recent admission for exacerbation secondary to noncompliance with diuretics, as well as history of pontine stroke and oxygen dependence. She has wounds on her bilateral lower extremities. She  says they have been present for about 2 months and initially started as blisters. She says that she has compression stockings, but does not wear them because they are too difficult to get on. ABIs done in clinic were 1.4 and 1.5, suggesting some degree of noncompressibility. She also has a wound on her right heel that she says is secondary to it getting bumped frequently as she transfers. She came to clinic with just dry dressings on her legs. She does have home health care, as well as her sister that have been assisting her with her dressings. She has multiple wounds. On the left medial calf, just above her ankle, there is a somewhat geographic wound with slough and some epithelialization. On the anterior tibial surface and lateral calf, the wounds are quite desiccated with yellow eschar present. On the right anterior tibial surface, the wounds are gritty with thin slough and eschar. The posterior right ankle wound is just above the calcaneus overlying the Achilles and has thick yellow-gray slough and a bit of odor. No purulent drainage appreciated. 04/10/2022: The wounds on her left leg are closed. On the right, the anterior tibial surface wounds are epithelializing and much cleaner. There is just a layer of biofilm on the open areas. On the posterior right ankle wound, there is a decreased volume of slough and no odor. We have been using Hydrofera Blue to all sites under compression. 04/17/2022: The wounds on her left leg remained closed and she did bring her juxta lite stockings with her today. On the right, the more distal wound is closed and the proximal wound has epithelialized significantly since her last visit. At the posterior right ankle, she still has accumulated some slough and some periwound callus but there is no odor or significant drainage. 04/24/2022: The right anterior leg wounds are closed. She still has a wound on her posterior right ankle that looks about the same as last week. She  did not bring her right sided juxta light with her today but is wearing the left sided stocking. 05/01/2022: She has a tiny abrasion on her left anterior tibia which we did not add to her wound count today. The wound on her posterior right ankle is a little bit smaller with a little accumulated slough. No drainage or concern  for infection. 05/08/2022: She continues to forget to bring her juxta lite stocking with her so she remains in a compression wrap despite healing of the right anterior leg wounds. The wound on her posterior right ankle/calcaneus is a bit smaller today with just a little bit of accumulated slough. She is wearing a juxta lite stocking on the left leg. 05/15/2022: She says that she has been wearing her juxta lite stockings at home, but today she is wearing regular compression stockings that she purchased from [employer]; the juxta lite stockings are in the laundry. The wound on her posterior right ankle/calcaneus continues to contract and has just a little bit of accumulated slough. Unfortunately, her edema control is suboptimal on the left and she has developed several small blisters with a larger 1 at the medial calf. 05/23/2022: The patient has been wearing her juxta lite stockings. Unfortunately, she has developed a large blister on her right lower leg just above the ankle. The skin overlying it is still intact. The blister that had opened on her left medial calf is smaller; and the other small blistered sites are closed. The posterior right ankle/calcaneus is about the same today. 06/07/2022: The only remaining open wound is on her posterior right calcaneus. The majority of this is covered with eschar. 06/30/2022: I am not sure why the patient did not return to clinic until today, but the ulcer on her right heel has healed. Unfortunately, she has developed a new ulcer on her left lateral foot. From the position of it, it looks as though the outward rotation of her lower leg at rest is  probably causing the lateral midfoot to rub or press against the metal leg rests of her wheelchair. She is not padding the chair when she rests her leg on the leg rests. The new ulcer is fairly superficial with just the fat layer exposed. It is quite tender. No erythema, induration, or purulent drainage identified. 07/12/2022: After our last visit, she was seen by podiatry. Nail care was performed. For some reason he had her apply silicone toe covers on her bilateral great toes. As result, the skin is now very macerated and she has a new ulcer on the knuckle of her right great toe. It is fairly small but does expose the fat layer. No erythema induration or purulent drainage although there was some odor on her feet when the caps were removed. The wound on her left lateral foot is smaller with just a small amount of slough buildup. Her Prevalon boots arrived and we will put her in those after her dressing is changed. 08/02/2022: She has been wearing her Prevalon boots to some extent, but says that she spends most of her day in her wheelchair and the boots slide off the foot rest. She apparently has not been utilizing the leg strap. The left lateral foot wound is tender and the tissue has some discoloration suggestive of ongoing pressure in this area. There is a little bit of callus accumulation circumferentially. The wound on her right great toe DIP joint is nearly closed and just has a light layer of eschar overlying the surface. No concern for infection. 08/16/2022: The wound on her right great toe has healed. The wound on her left lateral foot is quite a bit smaller with good perimeter epithelialization and a light layer of slough on the surface. Patient History Information obtained from Patient. Family History Heart Disease - Mother, Hypertension - Mother,Father, Stroke - Child. Social History Never smoker, Marital Status -  Divorced, Alcohol Use - Never, Drug Use - No History, Caffeine Use -  Never. Medical History Eyes Patient has history of Cataracts Hematologic/Lymphatic Patient has history of Anemia Respiratory Patient has history of Sleep Apnea Cardiovascular Patient has history of Congestive Heart Failure, Hypertension Endocrine Patient has history of Type I Diabetes Oncologic Patient has history of Received Radiation Hospitalization/Surgery History - pacemaker implant. - loop recorder insertion and removal. - esophagogastroduodenoscopy. - TEE without cardioversion. - colonoscopy. - left and right heart catheterization. - abdominal angiogram. - thyroidectomy. - abdominal hysterectomy. - ankle fracture surgery. Medical A Surgical History Notes nd Ear/Nose/Mouth/Throat edentulous, GERD, seasonal allergies, trouble swallowing Hematologic/Lymphatic hyperlipidemia Cardiovascular complete heart block, bradycardia, paroxysmal atrial fibrillation, pacemaker Gastrointestinal hemorrhoids Endocrine hypothyroidism Genitourinary proteinuria, frequent UTIs Musculoskeletal decreased ROM L shoulder, hemiparesis affecting L side, arthritis Neurologic benign paroxysmal positional vertigo, intracranial vascular stenosis, lacunar infarction, stroke Oncologic thyroid cancer Psychiatric depression Objective Constitutional Hypertensive, asymptomatic. No acute distress.. Vitals Time Taken: 3:54 PM, Height: 67 in, Weight: 243 lbs, BMI: 38.1, Temperature: 98.4 F, Pulse: 61 bpm, Respiratory Rate: 20 breaths/min, Blood Pressure: 161/68 mmHg. Respiratory Normal work of breathing on room air.. General Notes: 08/16/2022: The wound on her right great toe has healed. The wound on her left lateral foot is quite a bit smaller with good perimeter epithelialization and a light layer of slough on the surface. Integumentary (Hair, Skin) Wound #11 status is Open. Original cause of wound was Pressure Injury. The date acquired was: 06/16/2022. The wound has been in treatment 6 weeks.  The wound is located on the Foot. The wound measures 0.6cm length x 0.7cm width x 0.1cm depth; 0.33cm^2 area and 0.033cm^3 volume. There is Fat Layer (Subcutaneous Tissue) exposed. There is no tunneling or undermining noted. There is a medium amount of serosanguineous drainage noted. The wound margin is distinct with the outline attached to the wound base. There is medium (34-66%) red granulation within the wound bed. There is a medium (34-66%) amount of necrotic tissue within the wound bed including Adherent Slough. Wound #12 status is Open. Original cause of wound was Pressure Injury. The date acquired was: 07/12/2022. The wound has been in treatment 5 weeks. The wound is located on the Right T Great. The wound measures 0cm length x 0cm width x 0cm depth; 0cm^2 area and 0cm^3 volume. There is no tunneling or oe undermining noted. There is a medium amount of serosanguineous drainage noted. The wound margin is distinct with the outline attached to the wound base. There is no granulation within the wound bed. There is no necrotic tissue within the wound bed. 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 There was a Selective/Open Wound Non-Viable Tissue Debridement with a total area of 0.42 sq cm performed by Fredirick Maudlin, MD. With the following instrument(s): Curette to remove Non-Viable tissue/material. Material removed includes Eschar and Slough and after  achieving pain control using Lidocaine 5% topical ointment. No specimens were taken. A time out was conducted at 16:17, prior to the start of the procedure. A Minimum amount of bleeding was controlled  with Pressure. The procedure was tolerated well with a pain level of 0 throughout and a pain level of 0 following the procedure. Post Debridement Measurements: 0.6cm length x 0.4cm width x 0.1cm depth; 0.019cm^3 volume. Character of Wound/Ulcer Post Debridement is improved. Severity of Tissue Post Debridement is: Fat layer exposed. Post procedure Diagnosis Wound #11: Same as Pre-Procedure Plan Follow-up Appointments: Return Appointment in 2 weeks. - Dr. Celine Ahr - room 2 - Wednesday at 3:00 PM Anesthetic: Wound #11 Foot: (In clinic) Topical Lidocaine 5% applied to wound bed Wound #12 Right T Great: oe (In clinic) Topical Lidocaine 5% applied to wound bed Bathing/ Shower/ Hygiene: May shower and wash wound with soap and water. - with dressing changes Edema Control - Lymphedema / SCD / Other: Elevate legs to the level of the heart or above for 30 minutes daily and/or when sitting, a frequency of: - throughout the day Patient to wear own compression stockings every day. - juxtalites to both legs Exercise regularly Off-Loading: Other: - Prevalon boot to both feet. Please put pillow between foot and foot rest so the foot is not resting on the metal footrest. Home Health: Admit to [facility] for wound care. May utilize formulary equivalent dressing for wound treatment orders unless otherwise specified. New wound care orders this week; continue Home Health for wound care. May utilize formulary equivalent dressing for wound treatment orders unless otherwise specified. Other Home Health Orders/Instructions: [facility] WOUND #11: - Foot Wound Laterality: Cleanser: Soap and Water Every Other Day/15 Days Discharge Instructions: May shower and wash wound with dial antibacterial soap and water prior to  dressing change. Cleanser: Wound Cleanser Every Other Day/15 Days Discharge Instructions: Cleanse the wound with wound cleanser prior to applying a clean dressing using gauze sponges, not tissue or cotton balls. Prim Dressing: KerraCel Ag Gelling Fiber Dressing, 2x2 in (silver alginate) (Generic) Every Other Day/15 Days ary Discharge Instructions: Apply silver alginate to wound bed as instructed Secondary Dressing: Optifoam Non-Adhesive Dressing, 4x4 in Every Other Day/15 Days Discharge Instructions: Apply over primary dressing as directed. Secondary Dressing: Woven Gauze Sponge, Non-Sterile 4x4 in Every Other Day/15 Days Discharge Instructions: Apply over primary dressing as directed. Secured With: [employer], 4.5x3.1 (in/yd) Every Other Day/15 Days Discharge Instructions: Secure with Kerlix as directed. Secured With: 41M Medipore Public affairs consultant Surgical T 2x10 (in/yd) Every Other Day/15 Days ape Discharge Instructions: Secure with tape as directed. WOUND #12: - T Great Wound Laterality: Right oe Cleanser: Soap and Water Every Other Day/15 Days Discharge Instructions: May shower and wash wound with dial antibacterial soap and water prior to dressing change. Cleanser: Wound Cleanser Every Other Day/15 Days Discharge Instructions: Cleanse the wound with wound cleanser prior to applying a clean dressing using gauze sponges, not tissue or cotton balls. Prim Dressing: KerraCel Ag Gelling Fiber Dressing, 2x2 in (silver alginate) (Generic) Every Other Day/15 Days ary Discharge Instructions: Apply silver alginate to wound bed as instructed Secondary Dressing: Woven Gauze Sponges 2x2 in Every Other Day/15 Days Discharge Instructions: Apply over primary dressing as directed. Secured With: Child psychotherapist, Sterile 2x75 (in/in) Every Other Day/15 Days Discharge Instructions: Secure with stretch gauze as directed. 08/16/2022: The wound on her right great toe has healed. The wound  on her left lateral foot is quite a bit smaller with good perimeter epithelialization and a light layer of slough on the surface. I used a curette to debride the slough off of the left lateral foot wound. We will continue to use  silver alginate. She needs to continue to try and elevate her legs is much as possible and wear her Prevalon boots. This is a little bit of a challenge as she is in her wheelchair pretty much all day and she says that the boots make her feet slide off the leg supports. It sounds like the staff at her facility or not strapping her legs and when they elevate them; I encouraged her to request that they do this. She will follow-up in 2 weeks. Electronic Signature(s) Signed: 08/16/2022 4:44:21 PM By: Fredirick Maudlin MD FACS Entered By: Fredirick Maudlin on 08/16/2022 16:44:21 -------------------------------------------------------------------------------- HxROS Details Patient Name: Date of Service: Fetty, [name] 08/16/2022 3:45 PM Medical Record Number: [id number] Patient Account Number: [id number] Date of Birth/Sex: Treating RN: [birth year] (71 y.o. [name] Primary Care Provider: Jim Like Other Clinician: Referring Provider: Treating Provider/Extender: Rory Percy in Treatment: 19 Information Obtained From Patient Eyes Medical History: Positive for: Cataracts Ear/Nose/Mouth/Throat Medical History: Past Medical History Notes: edentulous, GERD, seasonal allergies, trouble swallowing Hematologic/Lymphatic Medical History: Positive for: Anemia Past Medical History Notes: hyperlipidemia Respiratory Medical History: Positive for: Sleep Apnea Cardiovascular Medical History: Positive for: Congestive Heart Failure; Hypertension Past Medical History Notes: complete heart block, bradycardia, paroxysmal atrial fibrillation, pacemaker Gastrointestinal Medical History: Past Medical History  Notes: hemorrhoids Endocrine Medical History: Positive for: Type I Diabetes Past Medical History Notes: hypothyroidism Time with diabetes: 38 yrs Treated with: Insulin Blood sugar tested every day: Yes Tested : on pump Genitourinary Medical History: Past Medical History Notes: proteinuria, frequent UTIs Musculoskeletal Medical History: Past Medical History Notes: decreased ROM L shoulder, hemiparesis affecting L side, arthritis Neurologic Medical History: Past Medical History Notes: benign paroxysmal positional vertigo, intracranial vascular stenosis, lacunar infarction, stroke Oncologic Medical History: Positive for: Received Radiation Past Medical History Notes: thyroid cancer Psychiatric Medical History: Past Medical History Notes: depression HBO Extended History Items Eyes: Cataracts Immunizations Pneumococcal Vaccine: Received Pneumococcal Vaccination: Yes Received Pneumococcal Vaccination On or After 60th Birthday: Yes Implantable Devices Yes Hospitalization / Surgery History Type of Hospitalization/Surgery pacemaker implant loop recorder insertion and removal esophagogastroduodenoscopy TEE without cardioversion colonoscopy left and right heart catheterization abdominal angiogram thyroidectomy abdominal hysterectomy ankle fracture surgery Family and Social History Heart Disease: Yes - Mother; Hypertension: Yes - Mother,Father; Stroke: Yes - Child; Never smoker; Marital Status - Divorced; Alcohol Use: Never; Drug Use: No History; Caffeine Use: Never; Financial Concerns: No; Food, Clothing or Shelter Needs: No; Support System Lacking: No; Transportation Concerns: No Electronic Signature(s) Signed: 08/16/2022 4:55:45 PM By: Fredirick Maudlin MD FACS Signed: 08/21/2022 5:02:03 PM By: Adline Peals Entered By: Fredirick Maudlin on 08/16/2022 16:42:47 -------------------------------------------------------------------------------- [place]  Details Patient Name: Date of Service: [name], [name] 08/16/2022 Medical Record Number: [id number] Patient Account Number: [id number] Date of Birth/Sex: Treating RN: 27-May-1951 (71 y.o. 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 I96.78 Type 1 diabetes mellitus with diabetic polyneuropathy Z68.41 Body mass index [BMI]40.0-44.9, adult E10.622 Type 1 diabetes mellitus with other skin ulcer L97.512 Non-pressure chronic ulcer of other part of right foot with fat layer exposed Facility Procedures CPT4 Code: [id number] Description: 10258 - DEBRIDE WOUND 1ST 20 SQ CM OR < ICD-10 Diagnosis Description L97.512 Non-pressure chronic ulcer of other part of right foot  with fat layer exposed Modifier: Quantity: 1 Physician Procedures : CPT4 Code Description Modifier [id number] - WC PHYS LEVEL 3 - EST PT 25 ICD-10 Diagnosis Description L97.512 Non-pressure chronic ulcer of other part of right foot with fat layer exposed Z68.41 Body mass index [BMI]40.0-44.9, adult I89.0 Lymphedema,  not elsewhere classified E10.622 Type 1 diabetes mellitus with other skin ulcer Quantity: 1 : [id number] - WC PHYS DEBR WO ANESTH 20 SQ CM ICD-10 Diagnosis Description L97.512 Non-pressure chronic ulcer of other part of right foot with fat layer exposed Quantity: 1 Electronic Signature(s) Signed: 08/16/2022 4:49:42 PM By: Fredirick Maudlin MD FACS Entered By: Fredirick Maudlin on 08/16/2022 16:49:42

## 2022-08-30 ENCOUNTER — Encounter (HOSPITAL_BASED_OUTPATIENT_CLINIC_OR_DEPARTMENT_OTHER): Payer: Medicare PPO | Attending: General Surgery | Admitting: General Surgery

## 2022-08-30 DIAGNOSIS — E039 Hypothyroidism, unspecified: Secondary | ICD-10-CM | POA: Insufficient documentation

## 2022-08-30 DIAGNOSIS — Z950 Presence of cardiac pacemaker: Secondary | ICD-10-CM | POA: Insufficient documentation

## 2022-08-30 DIAGNOSIS — I480 Paroxysmal atrial fibrillation: Secondary | ICD-10-CM | POA: Diagnosis not present

## 2022-08-30 DIAGNOSIS — E1042 Type 1 diabetes mellitus with diabetic polyneuropathy: Secondary | ICD-10-CM | POA: Insufficient documentation

## 2022-08-30 DIAGNOSIS — I509 Heart failure, unspecified: Secondary | ICD-10-CM | POA: Diagnosis not present

## 2022-08-30 DIAGNOSIS — Z85850 Personal history of malignant neoplasm of thyroid: Secondary | ICD-10-CM | POA: Diagnosis not present

## 2022-08-30 DIAGNOSIS — L97312 Non-pressure chronic ulcer of right ankle with fat layer exposed: Secondary | ICD-10-CM | POA: Insufficient documentation

## 2022-08-30 DIAGNOSIS — L97522 Non-pressure chronic ulcer of other part of left foot with fat layer exposed: Secondary | ICD-10-CM | POA: Diagnosis not present

## 2022-08-30 DIAGNOSIS — E10622 Type 1 diabetes mellitus with other skin ulcer: Secondary | ICD-10-CM | POA: Insufficient documentation

## 2022-08-30 DIAGNOSIS — I5032 Chronic diastolic (congestive) heart failure: Secondary | ICD-10-CM | POA: Diagnosis not present

## 2022-08-30 DIAGNOSIS — I890 Lymphedema, not elsewhere classified: Secondary | ICD-10-CM | POA: Diagnosis not present

## 2022-08-30 DIAGNOSIS — E1059 Type 1 diabetes mellitus with other circulatory complications: Secondary | ICD-10-CM | POA: Diagnosis not present

## 2022-08-30 DIAGNOSIS — E785 Hyperlipidemia, unspecified: Secondary | ICD-10-CM | POA: Diagnosis not present

## 2022-08-30 DIAGNOSIS — I110 Hypertensive heart disease with heart failure: Secondary | ICD-10-CM | POA: Diagnosis not present

## 2022-08-30 DIAGNOSIS — L97512 Non-pressure chronic ulcer of other part of right foot with fat layer exposed: Secondary | ICD-10-CM | POA: Insufficient documentation

## 2022-08-30 DIAGNOSIS — Z6841 Body Mass Index (BMI) 40.0 and over, adult: Secondary | ICD-10-CM | POA: Insufficient documentation

## 2022-08-30 DIAGNOSIS — Z9981 Dependence on supplemental oxygen: Secondary | ICD-10-CM | POA: Diagnosis not present

## 2022-08-30 DIAGNOSIS — G4730 Sleep apnea, unspecified: Secondary | ICD-10-CM | POA: Insufficient documentation

## 2022-08-30 DIAGNOSIS — E10621 Type 1 diabetes mellitus with foot ulcer: Secondary | ICD-10-CM | POA: Diagnosis not present

## 2022-08-31 NOTE — Progress Notes (Signed)
[name], [name] ([id number]) Visit Report for 08/30/2022 Chief Complaint Document Details Patient Name: Date of Service: [name], [name]. 08/30/2022 3:15 PM Medical Record Number: [id number] Patient Account Number: [id number] Date of Birth/Sex: Treating RN: 1951-09-05 (71 y.o. F) Primary Care Provider: Jim Like Other Clinician: Referring Provider: Treating Provider/Extender: Rory Percy in Treatment: 21 Information Obtained from: Patient Chief Complaint Patient presents for treatment of open ulcers due to venous insufficiency in the setting of poorly controlled DM1 and CHF Electronic Signature(s) Signed: 08/30/2022 4:32:41 PM By: Fredirick Maudlin MD FACS Entered By: Fredirick Maudlin on 08/30/2022 16:32:41 -------------------------------------------------------------------------------- Debridement Details Patient Name: Date of Service: [name], [name] 08/30/2022 3:15 PM Medical Record Number: [id number] Patient Account Number: [id number] Date of Birth/Sex: Treating RN: [birth year] (71 y.o. Iver Nestle, [place] Primary Care Provider: Jim Like Other Clinician: Referring Provider: Treating Provider/Extender: Rory Percy in Treatment: 21 Debridement Performed for Assessment: Wound #11 Left Foot Performed By: Physician Fredirick Maudlin, MD Debridement Type: Debridement Severity of Tissue Pre Debridement: Fat layer exposed Level of Consciousness (Pre-procedure): Awake and Alert Pre-procedure Verification/Time Out Yes - 15:45 Taken: Start Time: 15:46 Pain Control: Lidocaine 5% topical ointment T Area Debrided (L x W): otal 1 (cm) x 1 (cm) = 1 (cm) Tissue and other material debrided: Viable, Non-Viable, Slough, Subcutaneous, Slough Level: Skin/Subcutaneous Tissue Debridement Description: Excisional Instrument: Curette Bleeding: Minimum Hemostasis Achieved: Pressure Procedural Pain: 5 Post Procedural Pain:  0 Response to Treatment: Procedure was tolerated well Level of Consciousness (Post- Awake and Alert procedure): Post Debridement Measurements of Total Wound Length: (cm) 1 Width: (cm) 1 Depth: (cm) 0.2 Volume: (cm) 0.157 Character of Wound/Ulcer Post Debridement: Requires Further Debridement Severity of Tissue Post Debridement: Fat layer exposed Post Procedure Diagnosis Same as Pre-procedure Electronic Signature(s) Signed: 08/30/2022 4:48:35 PM By: Fredirick Maudlin MD FACS Signed: 08/31/2022 4:37:16 PM By: Blanche East RN Entered By: Blanche East on 08/30/2022 15:48:46 -------------------------------------------------------------------------------- HPI Details Patient Name: Date of Service: [name], [name] 08/30/2022 3:15 PM Medical Record Number: [id number] Patient Account Number: [id number] Date of Birth/Sex: Treating RN: 06/08/1951 (71 y.o. F) Primary Care Provider: Jim Like Other Clinician: Referring Provider: Treating Provider/Extender: Rory Percy in Treatment: 21 History of Present Illness HPI Description: ADMISSION 04/03/2022 This is a 71 year old woman with a past medical history notable for type 1 diabetes mellitus, poorly controlled with her most recent A1c being 8.3, congestive heart failure with recent admission for exacerbation secondary to noncompliance with diuretics, as well as history of pontine stroke and oxygen dependence. She has wounds on her bilateral lower extremities. She says they have been present for about 2 months and initially started as blisters. She says that she has compression stockings, but does not wear them because they are too difficult to get on. ABIs done in clinic were 1.4 and 1.5, suggesting some degree of noncompressibility. She also has a wound on her right heel that she says is secondary to it getting bumped frequently as she transfers. She came to clinic with just dry dressings on her legs. She does  have home health care, as well as her sister that have been assisting her with her dressings. She has multiple wounds. On the left medial calf, just above her ankle, there is a somewhat geographic wound with slough and some epithelialization. On the anterior tibial surface and lateral calf, the wounds are quite desiccated with yellow eschar present. On the right anterior tibial surface, the  wounds are gritty with thin slough and eschar. The posterior right ankle wound is just above the calcaneus overlying the Achilles and has thick yellow-gray slough and a bit of odor. No purulent drainage appreciated. 04/10/2022: The wounds on her left leg are closed. On the right, the anterior tibial surface wounds are epithelializing and much cleaner. There is just a layer of biofilm on the open areas. On the posterior right ankle wound, there is a decreased volume of slough and no odor. We have been using Hydrofera Blue to all sites under compression. 04/17/2022: The wounds on her left leg remained closed and she did bring her juxta lite stockings with her today. On the right, the more distal wound is closed and the proximal wound has epithelialized significantly since her last visit. At the posterior right ankle, she still has accumulated some slough and some periwound callus but there is no odor or significant drainage. 04/24/2022: The right anterior leg wounds are closed. She still has a wound on her posterior right ankle that looks about the same as last week. She did not bring her right sided juxta light with her today but is wearing the left sided stocking. 05/01/2022: She has a tiny abrasion on her left anterior tibia which we did not add to her wound count today. The wound on her posterior right ankle is a little bit smaller with a little accumulated slough. No drainage or concern for infection. 05/08/2022: She continues to forget to bring her juxta lite stocking with her so she remains in a compression wrap  despite healing of the right anterior leg wounds. The wound on her posterior right ankle/calcaneus is a bit smaller today with just a little bit of accumulated slough. She is wearing a juxta lite stocking on the left leg. 05/15/2022: She says that she has been wearing her juxta lite stockings at home, but today she is wearing regular compression stockings that she purchased from [employer]; the juxta lite stockings are in the laundry. The wound on her posterior right ankle/calcaneus continues to contract and has just a little bit of accumulated slough. Unfortunately, her edema control is suboptimal on the left and she has developed several small blisters with a larger 1 at the medial calf. 05/23/2022: The patient has been wearing her juxta lite stockings. Unfortunately, she has developed a large blister on her right lower leg just above the ankle. The skin overlying it is still intact. The blister that had opened on her left medial calf is smaller; and the other small blistered sites are closed. The posterior right ankle/calcaneus is about the same today. 06/07/2022: The only remaining open wound is on her posterior right calcaneus. The majority of this is covered with eschar. 06/30/2022: I am not sure why the patient did not return to clinic until today, but the ulcer on her right heel has healed. Unfortunately, she has developed a new ulcer on her left lateral foot. From the position of it, it looks as though the outward rotation of her lower leg at rest is probably causing the lateral midfoot to rub or press against the metal leg rests of her wheelchair. She is not padding the chair when she rests her leg on the leg rests. The new ulcer is fairly superficial with just the fat layer exposed. It is quite tender. No erythema, induration, or purulent drainage identified. 07/12/2022: After our last visit, she was seen by podiatry. Nail care was performed. For some reason he had her apply silicone toe  covers on her  bilateral great toes. As result, the skin is now very macerated and she has a new ulcer on the knuckle of her right great toe. It is fairly small but does expose the fat layer. No erythema induration or purulent drainage although there was some odor on her feet when the caps were removed. The wound on her left lateral foot is smaller with just a small amount of slough buildup. Her Prevalon boots arrived and we will put her in those after her dressing is changed. 08/02/2022: She has been wearing her Prevalon boots to some extent, but says that she spends most of her day in her wheelchair and the boots slide off the foot rest. She apparently has not been utilizing the leg strap. The left lateral foot wound is tender and the tissue has some discoloration suggestive of ongoing pressure in this area. There is a little bit of callus accumulation circumferentially. The wound on her right great toe DIP joint is nearly closed and just has a light layer of eschar overlying the surface. No concern for infection. 08/16/2022: The wound on her right great toe has healed. The wound on her left lateral foot is quite a bit smaller with good perimeter epithelialization and a light layer of slough on the surface. 08/30/2022: Today she is complaining of a lot more pain on her left lateral foot. I think we are struggling with adequate padding and prevention of pressure in the site. The wound opening itself is quite small, but there is circumferential undermining from old skin. There is no odor or purulent drainage. Electronic Signature(s) Signed: 08/30/2022 4:33:32 PM By: Fredirick Maudlin MD FACS Entered By: Fredirick Maudlin on 08/30/2022 16:33:32 -------------------------------------------------------------------------------- Physical Exam Details Patient Name: Date of Service: [name], [name] 08/30/2022 3:15 PM Medical Record Number: [id number] Patient Account Number: [id number] Date of Birth/Sex: Treating  RN: [birth year] (71 y.o. F) Primary Care Provider: Jim Like Other Clinician: Referring Provider: Treating Provider/Extender: Rory Percy in Treatment: 21 Constitutional Slightly hypertensive. . . . No acute distress.Marland Kitchen Respiratory Normal work of breathing on room air.. Notes 08/30/2022: Today she is complaining of a lot more pain on her left lateral foot. The wound opening itself is quite small, but there is circumferential undermining from old skin. There is no odor or purulent drainage. Electronic Signature(s) Signed: 08/30/2022 4:34:11 PM By: Fredirick Maudlin MD FACS Entered By: Fredirick Maudlin on 08/30/2022 16:34:11 -------------------------------------------------------------------------------- Physician Orders Details Patient Name: Date of Service: [name], [name] 08/30/2022 3:15 PM Medical Record Number: [id number] Patient Account Number: [id number] Date of Birth/Sex: Treating RN: 03/12/1951 (71 y.o. Iver Nestle, Jamie Primary Care Provider: Jim Like Other Clinician: Referring Provider: Treating Provider/Extender: Rory Percy in Treatment: 21 Verbal / Phone Orders: No Diagnosis Coding ICD-10 Coding Code Description I89.0 Lymphedema, not elsewhere classified V40.08 Chronic diastolic (congestive) heart failure E10.59 Type 1 diabetes mellitus with other circulatory complications Q76.19 Type 1 diabetes mellitus with diabetic polyneuropathy Z68.41 Body mass index [BMI]40.0-44.9, adult E10.622 Type 1 diabetes mellitus with other skin ulcer L97.512 Non-pressure chronic ulcer of other part of right foot with fat layer exposed Follow-up Appointments ppointment in 2 weeks. - Dr. Celine Ahr - room 4 - Return A Wednesday 09/13/22 at 3:15 PM New prescription for Gentamicin sent to pharmacy. Anesthetic Wound #11 Left Foot (In clinic) Topical Lidocaine 5% applied to wound bed - in clinic, prior to debridement Bathing/ Shower/  Hygiene May shower and wash wound with soap  and water. - with dressing changes Edema Control - Lymphedema / SCD / Other Bilateral Lower Extremities Elevate legs to the level of the heart or above for 30 minutes daily and/or when sitting, a frequency of: - throughout the day Patient to wear own compression stockings every day. - juxtalites to both legs Exercise regularly Off-Loading Other: - Prevalon boot to both feet. Please put pillow between foot and foot rest so the foot is not resting on the metal footrest. Home Health Admit to [facility] for wound care. May utilize formulary equivalent dressing for wound treatment orders unless otherwise specified. New wound care orders this week; continue Home Health for wound care. May utilize formulary equivalent dressing for wound treatment orders unless otherwise specified. - apply gentamicin to wound Other Home Health Orders/Instructions: [facility] Wound Treatment Wound #11 - Foot Wound Laterality: Left Cleanser: Soap and Water Every Other Day/15 Days Discharge Instructions: May shower and wash wound with dial antibacterial soap and water prior to dressing change. Cleanser: Wound Cleanser Every Other Day/15 Days Discharge Instructions: Cleanse the wound with wound cleanser prior to applying a clean dressing using gauze sponges, not tissue or cotton balls. Topical: Gentamicin Every Other Day/15 Days Discharge Instructions: As directed by physician Prim Dressing: KerraCel Ag Gelling Fiber Dressing, 2x2 in (silver alginate) (Generic) Every Other Day/15 Days ary Discharge Instructions: Apply silver alginate to wound bed as instructed Secondary Dressing: Optifoam Non-Adhesive Dressing, 4x4 in Every Other Day/15 Days Discharge Instructions: Apply over primary dressing as directed. Secondary Dressing: Woven Gauze Sponge, Non-Sterile 4x4 in Every Other Day/15 Days Discharge Instructions: Apply over primary dressing as directed. Secured With: [employer], 4.5x3.1 (in/yd) Every Other Day/15 Days Discharge Instructions: Secure with Kerlix as directed. Secured With: 71M [employer] Public affairs consultant Surgical T 2x10 (in/yd) Every Other Day/15 Days ape Discharge Instructions: Secure with tape as directed. Patient Medications llergies: latex A Notifications Medication Indication Start End 08/30/2022 gentamicin DOSE topical 0.1 % ointment - Apply thin layer to wound with each dressing change Electronic Signature(s) Signed: 08/30/2022 4:39:17 PM By: Fredirick Maudlin MD FACS Entered By: Fredirick Maudlin on 08/30/2022 16:39:16 -------------------------------------------------------------------------------- Problem List Details Patient Name: Date of Service: [name], [name] 08/30/2022 3:15 PM Medical Record Number: [id number] Patient Account Number: [id number] Date of Birth/Sex: Treating RN: Jan 30, 1951 (71 y.o. F) Primary Care Provider: Jim Like Other Clinician: Referring Provider: Treating Provider/Extender: Rory Percy in Treatment: 21 Active Problems ICD-10 Encounter Code Description Active Date MDM Diagnosis I89.0 Lymphedema, not elsewhere classified 04/03/2022 No Yes Q22.29 Chronic diastolic (congestive) heart failure 04/03/2022 No Yes E10.59 Type 1 diabetes mellitus with other circulatory complications 7/98/9211 No Yes E10.42 Type 1 diabetes mellitus with diabetic polyneuropathy 04/03/2022 No Yes Z68.41 Body mass index [BMI]40.0-44.9, adult 04/03/2022 No Yes E10.622 Type 1 diabetes mellitus with other skin ulcer 04/03/2022 No Yes L97.512 Non-pressure chronic ulcer of other part of right foot with fat layer exposed 07/12/2022 No Yes Inactive Problems ICD-10 Code Description Active Date Inactive Date L97.212 Non-pressure chronic ulcer of right calf with fat layer exposed 04/03/2022 04/03/2022 L97.222 Non-pressure chronic ulcer of left calf with fat layer exposed 04/03/2022 04/03/2022 L97.312 Non-pressure  chronic ulcer of right ankle with fat layer exposed 04/03/2022 04/03/2022 L97.821 Non-pressure chronic ulcer of other part of left lower leg limited to breakdown of skin 05/15/2022 05/15/2022 L97.422 Non-pressure chronic ulcer of left heel and midfoot with fat layer exposed 06/30/2022 06/30/2022 L97.811 Non-pressure chronic ulcer of other part of right lower leg limited  to breakdown of skin 05/23/2022 05/23/2022 Resolved Problems Electronic Signature(s) Signed: 08/30/2022 4:24:21 PM By: Fredirick Maudlin MD FACS Entered By: Fredirick Maudlin on 08/30/2022 16:24:20 -------------------------------------------------------------------------------- Progress Note Details Patient Name: Date of Service: [name], [name] 08/30/2022 3:15 PM Medical Record Number: [id number] Patient Account Number: [id number] Date of Birth/Sex: Treating RN: [birth year] (71 y.o. F) Primary Care Provider: Jim Like Other Clinician: Referring Provider: Treating Provider/Extender: Rory Percy in Treatment: 21 Subjective Chief Complaint Information obtained from Patient Patient presents for treatment of open ulcers due to venous insufficiency in the setting of poorly controlled DM1 and CHF History of Present Illness (HPI) ADMISSION 04/03/2022 This is a 71 year old woman with a past medical history notable for type 1 diabetes mellitus, poorly controlled with her most recent A1c being 8.3, congestive heart failure with recent admission for exacerbation secondary to noncompliance with diuretics, as well as history of pontine stroke and oxygen dependence. She has wounds on her bilateral lower extremities. She says they have been present for about 2 months and initially started as blisters. She says that she has compression stockings, but does not wear them because they are too difficult to get on. ABIs done in clinic were 1.4 and 1.5, suggesting some degree of noncompressibility. She also has a wound on  her right heel that she says is secondary to it getting bumped frequently as she transfers. She came to clinic with just dry dressings on her legs. She does have home health care, as well as her sister that have been assisting her with her dressings. She has multiple wounds. On the left medial calf, just above her ankle, there is a somewhat geographic wound with slough and some epithelialization. On the anterior tibial surface and lateral calf, the wounds are quite desiccated with yellow eschar present. On the right anterior tibial surface, the wounds are gritty with thin slough and eschar. The posterior right ankle wound is just above the calcaneus overlying the Achilles and has thick yellow-gray slough and a bit of odor. No purulent drainage appreciated. 04/10/2022: The wounds on her left leg are closed. On the right, the anterior tibial surface wounds are epithelializing and much cleaner. There is just a layer of biofilm on the open areas. On the posterior right ankle wound, there is a decreased volume of slough and no odor. We have been using Hydrofera Blue to all sites under compression. 04/17/2022: The wounds on her left leg remained closed and she did bring her juxta lite stockings with her today. On the right, the more distal wound is closed and the proximal wound has epithelialized significantly since her last visit. At the posterior right ankle, she still has accumulated some slough and some periwound callus but there is no odor or significant drainage. 04/24/2022: The right anterior leg wounds are closed. She still has a wound on her posterior right ankle that looks about the same as last week. She did not bring her right sided juxta light with her today but is wearing the left sided stocking. 05/01/2022: She has a tiny abrasion on her left anterior tibia which we did not add to her wound count today. The wound on her posterior right ankle is a little bit smaller with a little accumulated slough.  No drainage or concern for infection. 05/08/2022: She continues to forget to bring her juxta lite stocking with her so she remains in a compression wrap despite healing of the right anterior leg wounds. The wound on her posterior right  ankle/calcaneus is a bit smaller today with just a little bit of accumulated slough. She is wearing a juxta lite stocking on the left leg. 05/15/2022: She says that she has been wearing her juxta lite stockings at home, but today she is wearing regular compression stockings that she purchased from [employer]; the juxta lite stockings are in the laundry. The wound on her posterior right ankle/calcaneus continues to contract and has just a little bit of accumulated slough. Unfortunately, her edema control is suboptimal on the left and she has developed several small blisters with a larger 1 at the medial calf. 05/23/2022: The patient has been wearing her juxta lite stockings. Unfortunately, she has developed a large blister on her right lower leg just above the ankle. The skin overlying it is still intact. The blister that had opened on her left medial calf is smaller; and the other small blistered sites are closed. The posterior right ankle/calcaneus is about the same today. 06/07/2022: The only remaining open wound is on her posterior right calcaneus. The majority of this is covered with eschar. 06/30/2022: I am not sure why the patient did not return to clinic until today, but the ulcer on her right heel has healed. Unfortunately, she has developed a new ulcer on her left lateral foot. From the position of it, it looks as though the outward rotation of her lower leg at rest is probably causing the lateral midfoot to rub or press against the metal leg rests of her wheelchair. She is not padding the chair when she rests her leg on the leg rests. The new ulcer is fairly superficial with just the fat layer exposed. It is quite tender. No erythema, induration, or purulent drainage  identified. 07/12/2022: After our last visit, she was seen by podiatry. Nail care was performed. For some reason he had her apply silicone toe covers on her bilateral great toes. As result, the skin is now very macerated and she has a new ulcer on the knuckle of her right great toe. It is fairly small but does expose the fat layer. No erythema induration or purulent drainage although there was some odor on her feet when the caps were removed. The wound on her left lateral foot is smaller with just a small amount of slough buildup. Her Prevalon boots arrived and we will put her in those after her dressing is changed. 08/02/2022: She has been wearing her Prevalon boots to some extent, but says that she spends most of her day in her wheelchair and the boots slide off the foot rest. She apparently has not been utilizing the leg strap. The left lateral foot wound is tender and the tissue has some discoloration suggestive of ongoing pressure in this area. There is a little bit of callus accumulation circumferentially. The wound on her right great toe DIP joint is nearly closed and just has a light layer of eschar overlying the surface. No concern for infection. 08/16/2022: The wound on her right great toe has healed. The wound on her left lateral foot is quite a bit smaller with good perimeter epithelialization and a light layer of slough on the surface. 08/30/2022: Today she is complaining of a lot more pain on her left lateral foot. I think we are struggling with adequate padding and prevention of pressure in the site. The wound opening itself is quite small, but there is circumferential undermining from old skin. There is no odor or purulent drainage. Patient History Information obtained from Patient. Family History  Heart Disease - Mother, Hypertension - Mother,Father, Stroke - Child. Social History Never smoker, Marital Status - Divorced, Alcohol Use - Never, Drug Use - No History, Caffeine Use -  Never. Medical History Eyes Patient has history of Cataracts Hematologic/Lymphatic Patient has history of Anemia Respiratory Patient has history of Sleep Apnea Cardiovascular Patient has history of Congestive Heart Failure, Hypertension Endocrine Patient has history of Type I Diabetes Oncologic Patient has history of Received Radiation Hospitalization/Surgery History - pacemaker implant. - loop recorder insertion and removal. - esophagogastroduodenoscopy. - TEE without cardioversion. - colonoscopy. - left and right heart catheterization. - abdominal angiogram. - thyroidectomy. - abdominal hysterectomy. - ankle fracture surgery. Medical A Surgical History Notes nd Ear/Nose/Mouth/Throat edentulous, GERD, seasonal allergies, trouble swallowing Hematologic/Lymphatic hyperlipidemia Cardiovascular complete heart block, bradycardia, paroxysmal atrial fibrillation, pacemaker Gastrointestinal hemorrhoids Endocrine hypothyroidism Genitourinary proteinuria, frequent UTIs Musculoskeletal decreased ROM L shoulder, hemiparesis affecting L side, arthritis Neurologic benign paroxysmal positional vertigo, intracranial vascular stenosis, lacunar infarction, stroke Oncologic thyroid cancer Psychiatric depression Objective Constitutional Slightly hypertensive. No acute distress.. Vitals Time Taken: 3:40 PM, Height: 67 in, Weight: 243 lbs, BMI: 38.1, Temperature: 97.8 F, Pulse: 60 bpm, Respiratory Rate: 20 breaths/min, Blood Pressure: 148/71 mmHg, Capillary Blood Glucose: 143 mg/dl. Respiratory Normal work of breathing on room air.. General Notes: 08/30/2022: Today she is complaining of a lot more pain on her left lateral foot. The wound opening itself is quite small, but there is circumferential undermining from old skin. There is no odor or purulent drainage. Integumentary (Hair, Skin) Wound #11 status is Open. Original cause of wound was Pressure Injury. The date acquired was:  06/16/2022. The wound has been in treatment 8 weeks. The wound is located on the Left Foot. The wound measures 0.5cm length x 0.4cm width x 0.1cm depth; 0.157cm^2 area and 0.016cm^3 volume. There is Fat Layer (Subcutaneous Tissue) exposed. There is no tunneling noted, however, there is undermining starting at 12:00 and ending at 12:00 with a maximum distance of 1.4cm. There is a medium amount of serosanguineous drainage noted. The wound margin is distinct with the outline attached to the wound base. There is medium (34-66%) red granulation within the wound bed. There is a medium (34-66%) amount of necrotic tissue within the wound bed including Adherent Slough. 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 There was a Excisional Skin/Subcutaneous Tissue Debridement with a total area of 1 sq cm performed by Fredirick Maudlin, MD. With the following instrument(s): Curette to remove Viable and Non-Viable tissue/material. Material removed includes Subcutaneous Tissue and Slough and after achieving pain control using Lidocaine 5% topical ointment. No specimens were taken. A time out was conducted at 15:45, prior to the start of the procedure. A Minimum amount of bleeding was controlled with Pressure. The procedure was tolerated well with a pain level of 5 throughout and a pain level of 0 following the procedure. Post Debridement Measurements: 1cm length x 1cm width x 0.2cm  depth; 0.157cm^3 volume. Character of Wound/Ulcer Post Debridement requires further debridement. Severity of Tissue Post Debridement is: Fat layer exposed. Post  procedure Diagnosis Wound #11: Same as Pre-Procedure Plan Follow-up Appointments: Return Appointment in 2 weeks. - Dr. Celine Ahr - room 4 - Wednesday 09/13/22 at 3:15 PM New prescription for Gentamicin sent to pharmacy. Anesthetic: Wound #11 Left Foot: (In clinic) Topical Lidocaine 5% applied to wound bed - in clinic, prior to debridement Bathing/ Shower/ Hygiene: May shower and wash wound with soap and water. - with dressing changes Edema Control - Lymphedema / SCD / Other: Elevate legs to the level of the heart or above for 30 minutes daily and/or when sitting, a frequency of: - throughout the day Patient to wear own compression stockings every day. - juxtalites to both legs Exercise regularly Off-Loading: Other: - Prevalon boot to both feet. Please put pillow between foot and foot rest so the foot is not resting on the metal footrest. Home Health: Admit to [facility] for wound care. May utilize formulary equivalent dressing for wound treatment orders unless otherwise specified. New wound care orders this week; continue Home Health for wound care. May utilize formulary equivalent dressing for wound treatment orders unless otherwise specified. - apply gentamicin to wound Other Home Health Orders/Instructions: [facility] [place] The following medication(s) was prescribed: gentamicin topical 0.1 % ointment Apply thin layer to wound with each dressing change starting 08/30/2022 WOUND #11: - Foot Wound Laterality: Left Cleanser: Soap and Water Every Other Day/15 Days Discharge Instructions: May shower and wash wound with dial antibacterial soap and water prior to dressing change. Cleanser: Wound Cleanser Every Other Day/15 Days Discharge Instructions: Cleanse the wound with wound cleanser prior to applying a clean dressing using gauze sponges, not  tissue or cotton balls. Topical: Gentamicin Every Other Day/15 Days Discharge Instructions: As directed by physician Prim Dressing: KerraCel Ag Gelling Fiber Dressing, 2x2 in (silver alginate) (Generic) Every Other Day/15 Days ary Discharge Instructions: Apply silver alginate to wound bed as instructed Secondary Dressing: Optifoam Non-Adhesive Dressing, 4x4 in Every Other Day/15 Days Discharge Instructions: Apply over primary dressing as directed. Secondary Dressing: Woven Gauze Sponge, Non-Sterile 4x4 in Every Other Day/15 Days Discharge Instructions: Apply over primary dressing as directed. Secured With: [employer], 4.5x3.1 (in/yd) Every Other Day/15 Days Discharge Instructions: Secure with Kerlix as directed. Secured With: 67M Medipore Public affairs consultant Surgical T 2x10 (in/yd) Every Other Day/15 Days ape Discharge Instructions: Secure with tape as directed. 08/30/2022: Today she is complaining of a lot more pain on her left lateral foot. I think we are struggling with adequate padding and prevention of pressure in the site. The wound opening itself is quite small, but there is circumferential undermining from old skin. There is no odor or purulent drainage. I used a curette to debride the old skin and reduce the undermining. I think the main problem is that she just does not adequately pad or offload the site so she is probably developing further deep tissue injury that we simply cannot see at this point. However the possibility of infection does exist since there has been an acute change since our last visit. I am going to prescribe topical gentamicin for now and see if this makes a difference. She will follow-up in 2 weeks. Electronic Signature(s) Signed: 08/30/2022 4:39:31 PM By: Fredirick Maudlin MD FACS Previous Signature: 08/30/2022 4:38:21 PM Version By: Fredirick Maudlin MD FACS Entered By: Fredirick Maudlin on 08/30/2022  16:39:30 -------------------------------------------------------------------------------- HxROS Details Patient Name: Date of Service: [name], [name] 08/30/2022 3:15 PM Medical Record Number: [id number] Patient Account Number: [id number] Date of Birth/Sex: Treating RN: [birth year] (71  y.o. F) Primary Care Provider: Jim Like Other Clinician: Referring Provider: Treating Provider/Extender: Rory Percy in Treatment: 21 Information Obtained From Patient Eyes Medical History: Positive for: Cataracts Ear/Nose/Mouth/Throat Medical History: Past Medical History Notes: edentulous, GERD, seasonal allergies, trouble swallowing Hematologic/Lymphatic Medical History: Positive for: Anemia Past Medical History Notes: hyperlipidemia Respiratory Medical History: Positive for: Sleep Apnea Cardiovascular Medical History: Positive for: Congestive Heart Failure; Hypertension Past Medical History Notes: complete heart block, bradycardia, paroxysmal atrial fibrillation, pacemaker Gastrointestinal Medical History: Past Medical History Notes: hemorrhoids Endocrine Medical History: Positive for: Type I Diabetes Past Medical History Notes: hypothyroidism Time with diabetes: 25 yrs Treated with: Insulin Blood sugar tested every day: Yes Tested : on pump Genitourinary Medical History: Past Medical History Notes: proteinuria, frequent UTIs Musculoskeletal Medical History: Past Medical History Notes: decreased ROM L shoulder, hemiparesis affecting L side, arthritis Neurologic Medical History: Past Medical History Notes: benign paroxysmal positional vertigo, intracranial vascular stenosis, lacunar infarction, stroke Oncologic Medical History: Positive for: Received Radiation Past Medical History Notes: thyroid cancer Psychiatric Medical History: Past Medical History Notes: depression HBO Extended History  Items Eyes: Cataracts Immunizations Pneumococcal Vaccine: Received Pneumococcal Vaccination: Yes Received Pneumococcal Vaccination On or After 60th Birthday: Yes Implantable Devices Yes Hospitalization / Surgery History Type of Hospitalization/Surgery pacemaker implant loop recorder insertion and removal esophagogastroduodenoscopy TEE without cardioversion colonoscopy left and right heart catheterization abdominal angiogram thyroidectomy abdominal hysterectomy ankle fracture surgery Family and Social History Heart Disease: Yes - Mother; Hypertension: Yes - Mother,Father; Stroke: Yes - Child; Never smoker; Marital Status - Divorced; Alcohol Use: Never; Drug Use: No History; Caffeine Use: Never; Financial Concerns: No; Food, Clothing or Shelter Needs: No; Support System Lacking: No; Transportation Concerns: No Electronic Signature(s) Signed: 08/30/2022 4:48:35 PM By: Fredirick Maudlin MD FACS Entered By: Fredirick Maudlin on 08/30/2022 16:33:38 -------------------------------------------------------------------------------- SuperBill Details Patient Name: Date of Service: [name], [name] 08/30/2022 Medical Record Number: [id number] Patient Account Number: [id number] Date of Birth/Sex: Treating RN: 12-Jul-1951 (71 y.o. 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 W40.97 Type 1 diabetes mellitus with diabetic polyneuropathy Z68.41 Body mass index [BMI]40.0-44.9, adult E10.622 Type 1 diabetes mellitus with other skin ulcer L97.512 Non-pressure chronic ulcer of other part of right foot with fat layer exposed Facility Procedures CPT4 Code:  [id number] Description: [facility] - DEB SUBQ TISSUE 20 SQ CM/< ICD-10 Diagnosis Description L97.512 Non-pressure chronic ulcer of other part of right foot with fat layer exposed Modifier: Quantity: 1 Physician Procedures : CPT4 Code Description Modifier [id number] - WC PHYS LEVEL 4 - EST PT 25 ICD-10 Diagnosis Description L97.512 Non-pressure chronic ulcer of other part of right foot with fat layer exposed E10.622 Type 1 diabetes mellitus with other skin ulcer E10.42  Type 1 diabetes mellitus with diabetic polyneuropathy N98.92 Chronic diastolic (congestive) heart failure Quantity: 1 : [id number] 11042 - WC PHYS SUBQ TISS 20 SQ CM ICD-10 Diagnosis Description L97.512 Non-pressure chronic ulcer of other part of right foot with fat layer exposed Quantity: 1 Electronic Signature(s) Signed: 08/30/2022 4:39:59 PM By: Fredirick Maudlin MD FACS Entered By: Fredirick Maudlin on 08/30/2022 16:39:58

## 2022-08-31 NOTE — Progress Notes (Signed)
[name], [name] Estrella [name] ([id number]) Visit Report for 08/30/2022 Arrival Information Details Patient Name: Date of Service: [name], [name]. 08/30/2022 3:15 PM Medical Record Number: [id number] Patient Account Number: [id number] Date of Birth/Sex: Treating RN: October 26, 1951 (71 y.o. Marta Lamas Primary Care Burna Atlas: Jim Like Other Clinician: Referring Asa Fath: Treating Jordane Hisle/Extender: Rory Percy in Treatment: 21 Visit Information History Since Last Visit All ordered tests and consults were completed: Yes Patient Arrived: Wheel Chair Added or deleted any medications: No Arrival Time: 15:39 Any new allergies or adverse reactions: No Accompanied By: niece Had a fall or experienced change in No Transfer Assistance: Other activities of daily living that may affect Patient Identification Verified: Yes risk of falls: Patient Requires Transmission-Based Precautions: No Signs or symptoms of abuse/neglect since last visito No Patient Has Alerts: Yes Hospitalized since last visit: No Patient Alerts: Patient on Blood Thinner Implantable device outside of the clinic excluding No pacemaker cellular tissue based products placed in the center since last visit: Has Dressing in Place as Prescribed: Yes Pain Present Now: Yes Electronic Signature(s) Signed: 08/31/2022 4:37:16 PM By: Blanche East RN Entered By: Blanche East on 08/30/2022 15:40:18 -------------------------------------------------------------------------------- Encounter Discharge Information Details Patient Name: Date of Service: [name], [name] 08/30/2022 3:15 PM Medical Record Number: [id number] Patient Account Number: [id number] Date of Birth/Sex: Treating RN: 1951/09/27 (71 y.o. Marta Lamas Primary Care Rashawn Rayman: Jim Like Other Clinician: Referring Jessel Gettinger: Treating Jose Corvin/Extender: Rory Percy in Treatment: 21 Encounter Discharge  Information Items Post Procedure Vitals Discharge Condition: Stable Temperature (F): 97.8 Ambulatory Status: Wheelchair Pulse (bpm): 60 Discharge Destination: Home Respiratory Rate (breaths/min): 20 Transportation: Private Auto Blood Pressure (mmHg): 148/71 Accompanied By: niece Schedule Follow-up Appointment: Yes Clinical Summary of Care: Electronic Signature(s) Signed: 08/31/2022 4:37:16 PM By: Blanche East RN Entered By: Blanche East on 08/30/2022 16:45:10 -------------------------------------------------------------------------------- Lower Extremity Assessment Details Patient Name: Date of Service: Kochanski, [name] [name] 08/30/2022 3:15 PM Medical Record Number: [id number] Patient Account Number: [id number] Date of Birth/Sex: Treating RN: 19-Mar-1951 (71 y.o. Marta Lamas Primary Care Burgess Sheriff: Jim Like Other Clinician: Referring Linsey Hirota: Treating Johnathan Heskett/Extender: Rory Percy in Treatment: 21 Edema Assessment Assessed: [Left: No] [Right: No] E[Left: dema] [Right: :] Calf Left: Right: Point of Measurement: From Medial Instep 38 cm 35.2 cm Ankle Left: Right: Point of Measurement: From Medial Instep 24 cm 22.3 cm Vascular Assessment Pulses: Dorsalis Pedis Palpable: [Left:Yes] Electronic Signature(s) Signed: 08/31/2022 4:37:16 PM By: Blanche East RN Entered By: Blanche East on 08/30/2022 15:41:00 -------------------------------------------------------------------------------- Multi Wound Chart Details Patient Name: Date of Service: [name], [name] 08/30/2022 3:15 PM Medical Record Number: [id number] Patient Account Number: [id number] Date of Birth/Sex: Treating RN: 15-Jul-1951 (71 y.o. 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: Extremity Cataracts, Anemia, Sleep Apnea, N/A N/A Comorbid History: Congestive Heart Failure, Hypertension, Type I Diabetes, Received Radiation 06/16/2022 N/A N/A Date Acquired: 8 N/A N/A Weeks of Treatment: Open N/A N/A Wound Status: No N/A N/A Wound Recurrence: 0.5x0.4x0.1 N/A N/A Measurements L x W x D (cm) 0.157 N/A N/A A (cm) : rea 0.016 N/A N/A Volume (cm) :  90.50% N/A N/A % Reduction in A rea: 90.30% N/A N/A % Reduction in Volume: 12 Starting Position 1 (o'clock): 12 Ending Position 1 (o'clock): 1.4 Maximum Distance 1 (cm): Yes N/A N/A Undermining: Grade 2 N/A N/A Classification: Medium N/A N/A Exudate A mount: Serosanguineous N/A N/A Exudate Type: red, brown N/A N/A Exudate Color: Distinct, outline attached N/A N/A Wound Margin: Medium (34-66%) N/A N/A Granulation A mount: Red N/A N/A Granulation Quality: Medium (34-66%) N/A N/A Necrotic A mount: Fat Layer (Subcutaneous Tissue): Yes N/A N/A Exposed Structures: Fascia: No Tendon: No Muscle: No Joint: No Bone: No Small (1-33%) N/A N/A Epithelialization: Debridement - Excisional N/A N/A Debridement: Pre-procedure Verification/Time Out 15:45 N/A N/A Taken: Lidocaine 5% topical ointment N/A N/A Pain Control: Subcutaneous, Slough N/A N/A Tissue Debrided: Skin/Subcutaneous Tissue N/A N/A Level: 1 N/A N/A Debridement A (sq cm): rea Curette N/A N/A Instrument: Minimum N/A N/A Bleeding: Pressure N/A N/A Hemostasis A chieved: 5 N/A N/A Procedural Pain: 0 N/A N/A Post Procedural Pain: Procedure was tolerated well N/A N/A Debridement Treatment Response: 1x1x0.2 N/A N/A Post Debridement  Measurements L x W x D (cm) 0.157 N/A N/A Post Debridement Volume: (cm) Debridement N/A N/A Procedures Performed: Treatment Notes Wound #11 (Foot) Wound Laterality: Left Cleanser Soap and Water Discharge Instruction: May shower and wash wound with dial antibacterial soap and water prior to dressing change. Wound Cleanser Discharge Instruction: Cleanse the wound with wound cleanser prior to applying a clean dressing using gauze sponges, not tissue or cotton balls. Peri-Wound Care Topical Gentamicin Discharge Instruction: As directed by physician Primary Dressing KerraCel Ag Gelling Fiber Dressing, 2x2 in (silver alginate) Discharge Instruction: Apply silver alginate to wound bed as instructed Secondary Dressing Optifoam Non-Adhesive Dressing, 4x4 in Discharge Instruction: Apply over primary dressing as directed. Woven Gauze Sponge, Non-Sterile 4x4 in Discharge Instruction: Apply over primary dressing as directed. Secured With [employer], 4.5x3.1 (in/yd) Discharge Instruction: Secure with Kerlix as directed. 11M Medipore Soft Cloth Surgical T 2x10 (in/yd) ape Discharge Instruction: Secure with tape as directed. Compression Wrap Compression Stockings Add-Ons Electronic Signature(s) Signed: 08/30/2022 4:24:52 PM By: Fredirick Maudlin MD FACS Entered By: Fredirick Maudlin on 08/30/2022 16:24:52 -------------------------------------------------------------------------------- Multi-Disciplinary Care Plan Details Patient Name: Date of Service: [name], RO [place] [name] 08/30/2022 3:15 PM Medical Record Number: [id number] Patient Account Number: [id number] Date of Birth/Sex: Treating RN: Nov 15, 1951 (71 y.o. Iver Nestle, Jamie Primary Care Canda Podgorski: Jim Like Other Clinician: Referring Morganna Styles: Treating Reagen Goates/Extender: Rory Percy in Treatment: 21 Multidisciplinary Care Plan reviewed with physician Active Inactive Abuse / Safety / Falls / Self  Care Management Nursing Diagnoses: History of Falls Impaired physical mobility Goals: Patient/caregiver will verbalize/demonstrate measures taken to prevent injury and/or falls Date Initiated: 04/03/2022 Target Resolution Date: 09/22/2022 Goal Status: Active Interventions: Assess fall risk on admission and as needed Assess: immobility, friction, shearing, incontinence upon admission and as needed Assess impairment of mobility on admission and as needed per policy Notes: Nutrition Nursing Diagnoses: Impaired glucose control: actual or potential Potential for alteratiion in Nutrition/Potential for imbalanced nutrition Goals: Patient/caregiver will maintain therapeutic glucose control Date Initiated: 04/03/2022 Target Resolution Date: 09/22/2022 Goal Status: Active Interventions: Assess HgA1c results as ordered upon admission and as needed Provide education on elevated blood sugars and impact on wound healing Treatment Activities: Patient referred to Primary Care Physician for further nutritional evaluation : 04/03/2022 Notes: Venous Leg Ulcer Nursing Diagnoses: Knowledge deficit related to disease process and management Potential for venous Insuffiency (use before diagnosis confirmed) Goals: Patient will  maintain optimal edema control Date Initiated: 04/03/2022 Target Resolution Date: 09/22/2022 Goal Status: Active Interventions: Assess peripheral edema status every visit. Compression as ordered Provide education on venous insufficiency Treatment Activities: Therapeutic compression applied : 04/03/2022 Notes: Electronic Signature(s) Signed: 08/31/2022 4:37:16 PM By: Blanche East RN Entered By: Blanche East on 08/30/2022 15:41:26 -------------------------------------------------------------------------------- Pain Assessment Details Patient Name: Date of Service: [name], [name] 08/30/2022 3:15 PM Medical Record Number: [id number] Patient Account Number: [id number] Date  of Birth/Sex: Treating RN: Oct 31, 1951 (71 y.o. Marta Lamas Primary Care Joeziah Voit: Jim Like Other Clinician: Referring Socorro Ebron: Treating Zawadi Aplin/Extender: Rory Percy in Treatment: 21 Active Problems Location of Pain Severity and Description of Pain Patient Has Paino Yes Site Locations Rate the pain. Current Pain Level: 5 Pain Management and Medication Current Pain Management: Electronic Signature(s) Signed: 08/31/2022 4:37:16 PM By: Blanche East RN Entered By: Blanche East on 08/30/2022 15:40:48 -------------------------------------------------------------------------------- Patient/Caregiver Education Details Patient Name: Date of Service: Jacqulyn Ducking 9/6/2023andnbsp3:15 PM Medical Record Number: [id number] Patient Account Number: [id number] Date of Birth/Gender: Treating RN: Oct 22, 1951 (71 y.o. Marta Lamas Primary Care Physician: Jim Like Other Clinician: Referring Physician: Treating Physician/Extender: Rory Percy in Treatment: 21 Education Assessment Education Provided To: Patient Education Topics Provided Elevated Blood Sugar/ Impact on Healing: Methods: Explain/Verbal Responses: Reinforcements needed, State content correctly Venous: Methods: Explain/Verbal Responses: Reinforcements needed, State content correctly Electronic Signature(s) Signed: 08/31/2022 4:37:16 PM By: Blanche East RN Entered By: Blanche East on 08/30/2022 15:41:43 -------------------------------------------------------------------------------- Wound Assessment Details Patient Name: Date of Service: [name], [name] 08/30/2022 3:15 PM Medical Record Number: [id number] Patient Account Number: [id number] Date of Birth/Sex: Treating RN: Mar 13, 1951 (71 y.o. Iver Nestle, Jamie Primary Care Davaughn Hillyard: Jim Like Other Clinician: Referring Kyser Wandel: Treating Boubacar Lerette/Extender: Rory Percy in Treatment: 21 Wound Status Wound Number: 11 Primary Diabetic Wound/Ulcer of the Lower Extremity Etiology: Wound Location: Left Foot Wound Open Wounding Event: Pressure Injury Status: Date Acquired: 06/16/2022 Comorbid Cataracts, Anemia, Sleep Apnea, Congestive Heart Failure, Weeks Of Treatment: 8 History: Hypertension, Type I Diabetes, Received Radiation Clustered Wound: No Photos Wound Measurements Length: (cm) 0.5 Width: (cm) 0.4 Depth: (cm) 0.1 Area: (cm) 0.157 Volume: (cm) 0.016 % Reduction in Area: 90.5% % Reduction in Volume: 90.3% Epithelialization: Small (1-33%) Tunneling: No Undermining: Yes Starting Position (o'clock): 12 Ending Position (o'clock): 12 Maximum Distance: (cm) 1.4 Wound Description Classification: Grade 2 Wound Margin: Distinct, outline attached Exudate Amount: Medium Exudate Type: Serosanguineous Exudate Color: red, brown Foul Odor After Cleansing: No Slough/Fibrino Yes Wound Bed Granulation Amount: Medium (34-66%) Exposed Structure Granulation Quality: Red Fascia Exposed: No Necrotic Amount: Medium (34-66%) Fat Layer (Subcutaneous Tissue) Exposed: Yes Necrotic Quality: Adherent Slough Tendon Exposed: No Muscle Exposed: No Joint Exposed: No Bone Exposed: No Treatment Notes Wound #11 (Foot) Wound Laterality: Left Cleanser Soap and Water Discharge Instruction: May shower and wash wound with dial antibacterial soap and water prior to dressing change. Wound Cleanser Discharge Instruction: Cleanse the wound with wound cleanser prior to applying a clean dressing using gauze sponges, not tissue or cotton balls. Peri-Wound Care Topical Gentamicin Discharge Instruction: As directed by physician Primary Dressing KerraCel Ag Gelling Fiber Dressing, 2x2 in (silver alginate) Discharge Instruction: Apply silver alginate to wound bed as instructed Secondary Dressing Optifoam Non-Adhesive Dressing, 4x4 in Discharge Instruction:  Apply over primary dressing as directed. Woven Gauze Sponge, Non-Sterile 4x4 in Discharge Instruction: Apply over primary dressing as directed. Secured With [employer], 4.5x3.1 (in/yd)  Discharge Instruction: Secure with Kerlix as directed. 1M Medipore Soft Cloth Surgical T 2x10 (in/yd) ape Discharge Instruction: Secure with tape as directed. Compression Wrap Compression Stockings Add-Ons Electronic Signature(s) Signed: 08/31/2022 4:37:16 PM By: Blanche East RN Entered By: Blanche East on 08/30/2022 15:37:32 -------------------------------------------------------------------------------- Vitals Details Patient Name: Date of Service: [name], [name] 08/30/2022 3:15 PM Medical Record Number: [id number] Patient Account Number: [id number] Date of Birth/Sex: Treating RN: 08-19-1951 (71 y.o. Iver Nestle, Jamie Primary Care Phillp Dolores: Jim Like Other Clinician: Referring Aleni Andrus: Treating Lunden Mcleish/Extender: Rory Percy in Treatment: 21 Vital Signs Time Taken: 15:40 Temperature (F): 97.8 Height (in): 67 Pulse (bpm): 60 Weight (lbs): 243 Respiratory Rate (breaths/min): 20 Body Mass Index (BMI): 38.1 Blood Pressure (mmHg): 148/71 Capillary Blood Glucose (mg/dl): 143 Reference Range: 80 - 120 mg / dl Electronic Signature(s) Signed: 08/31/2022 4:37:16 PM By: Blanche East RN Entered By: Blanche East on 08/30/2022 15:40:41

## 2022-09-03 ENCOUNTER — Other Ambulatory Visit: Payer: Self-pay | Admitting: Family Medicine

## 2022-09-04 ENCOUNTER — Other Ambulatory Visit: Payer: Self-pay

## 2022-09-04 NOTE — Telephone Encounter (Signed)
Patient calls nurse line requesting refill on gabapentin and tramadol. Patient states that she has been taking three capsules of gabapentin daily. Current prescription is written for one daily. Patient is asking if prescription can be updated to quantity of 180 capsules.   She is also requesting refill on tramadol.   Will forward to PCP.   Talbot Grumbling, RN

## 2022-09-06 ENCOUNTER — Other Ambulatory Visit: Payer: Self-pay | Admitting: *Deleted

## 2022-09-07 DIAGNOSIS — E108 Type 1 diabetes mellitus with unspecified complications: Secondary | ICD-10-CM | POA: Diagnosis not present

## 2022-09-07 MED ORDER — GABAPENTIN 300 MG PO CAPS
300.0000 mg | ORAL_CAPSULE | ORAL | 0 refills | Status: DC | PRN
Start: 2022-09-07 — End: 2022-11-07

## 2022-09-11 ENCOUNTER — Ambulatory Visit: Payer: Medicare PPO | Attending: Cardiology | Admitting: Cardiology

## 2022-09-11 ENCOUNTER — Encounter: Payer: Self-pay | Admitting: Cardiology

## 2022-09-11 VITALS — BP 130/70 | HR 69 | Ht 67.0 in | Wt 253.0 lb

## 2022-09-11 DIAGNOSIS — I10 Essential (primary) hypertension: Secondary | ICD-10-CM | POA: Diagnosis not present

## 2022-09-11 DIAGNOSIS — Z950 Presence of cardiac pacemaker: Secondary | ICD-10-CM | POA: Diagnosis not present

## 2022-09-11 DIAGNOSIS — I272 Pulmonary hypertension, unspecified: Secondary | ICD-10-CM

## 2022-09-11 DIAGNOSIS — I480 Paroxysmal atrial fibrillation: Secondary | ICD-10-CM | POA: Diagnosis not present

## 2022-09-11 NOTE — Progress Notes (Signed)
Cardiology Office Note:    Date:  09/11/2022   ID:  [name] Candia Kingsbury, DOB 1951/12/03, MRN [id number]  PCP:  Eppie Gibson, MD   [facility] Providers Cardiologist:  Candee Furbish, MD Electrophysiologist:  Constance Haw, MD     Referring MD: Donnal Moat*    History of Present Illness:    Dariyah Garduno is a 71 y.o. female here for the follow-up of atrial fibrillation followed by Dr. Curt Bears with symptomatic bradycardia pacemaker placement with diabetes hypertension hyperlipidemia stroke 2016 in 2019, normal coronary arteries by catheterization in 2013.  My last visit with her was in 2020.  She has been seeing EP team since.  No recent changes to pacemaker were made.  She did suffer a fall last November.    Past Medical History:  Diagnosis Date   Abnormal echocardiogram    Anemia    Arthritis    BPPV (benign paroxysmal positional vertigo), right 06/13/2017   Bradycardia 03/07/2020   Cerebrovascular accident (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 ([place]) 10/29/2015   CHB (complete heart block) ([name]) 03/08/2020   CHF (congestive heart failure) ([place])    Closed fracture of left proximal humerus 07/08/2019   Decreased range of motion of left shoulder 09/03/2020   Depression    Dizzy 03/07/2020   bradycardia   Edentulous 09/03/2020   GERD (gastroesophageal reflux disease)    Hemiparesis affecting left side as late effect of cerebrovascular accident (CVA) ([place]) 03/08/2020   Hemorrhoids    History of Falls with injury 03/08/2020   Fall resulting in left humeral fracture   Hyperlipidemia    Hypertension    Hypothyroidism    Intracranial vascular stenosis    Lacunar infarction ([place])    Brain MRI 07/2021 multiple small remote pontine lacunar infarcts   Near syncope 03/08/2020   Paroxysmal atrial fibrillation (HCC)    Presence of permanent cardiac pacemaker    Proteinuria 03/08/2020   Seasonal allergies    Stroke ([place])     Thyroid cancer ([place])    per pt S/p Total Thyroidectomy with Radioactive Iodine Therapy   Type 1 diabetes mellitus ([place])     Past Surgical History:  Procedure Laterality Date   ABDOMINAL ANGIOGRAM N/A 05/14/2012   Procedure: ABDOMINAL ANGIOGRAM;  Surgeon: Laverda Page, MD;  Location[facility] CATH LAB;  Service: Cardiovascular;  Laterality: N/A;   ABDOMINAL HYSTERECTOMY  1995   partial   ANKLE FRACTURE SURGERY Right    CARDIAC CATHETERIZATION     with coronary angiogram   cataract  Bilateral 2018   COLONOSCOPY N/A 09/09/2014   Procedure: COLONOSCOPY;  Surgeon: Gatha Mayer, MD;  Location: [place];  Service: Endoscopy;  Laterality: N/A;   ESOPHAGOGASTRODUODENOSCOPY (EGD) WITH PROPOFOL N/A 12/03/2018   Procedure: ESOPHAGOGASTRODUODENOSCOPY (EGD) WITH PROPOFOL;  Surgeon: Doran Stabler, MD;  Location: [place];  Service: Gastroenterology;  Laterality: N/A;   LEFT AND RIGHT HEART CATHETERIZATION WITH CORONARY ANGIOGRAM N/A 05/14/2012   Procedure: LEFT AND RIGHT HEART CATHETERIZATION WITH CORONARY ANGIOGRAM;  Surgeon: Laverda Page, MD;  Location: [facility] CATH LAB;  Service: Cardiovascular;  Laterality: N/A;   LOOP RECORDER INSERTION N/A 12/23/2018   Procedure: LOOP RECORDER INSERTION;  Surgeon: Constance Haw, MD;  Location: [place] CV LAB;  Service: Cardiovascular;  Laterality: N/A;   LOOP RECORDER REMOVAL N/A 06/25/2020   Procedure: LOOP RECORDER REMOVAL;  Surgeon: Constance Haw, MD;  Location: [place] CV LAB;  Service: Cardiovascular;  Laterality: N/A;  MINI METER   09/08/2014   ELECTRONIC INSULIN PUMP   PACEMAKER IMPLANT N/A 06/25/2020   Procedure: PACEMAKER IMPLANT;  Surgeon: Constance Haw, MD;  Location: [place] CV LAB;  Service: Cardiovascular;  Laterality: N/A;   TEE WITHOUT CARDIOVERSION N/A 10/16/2018   Procedure: TRANSESOPHAGEAL ECHOCARDIOGRAM (TEE);  Surgeon: Buford Dresser, MD;  Location: [facility] ENDOSCOPY;  Service:  Cardiovascular;  Laterality: N/A;   THYROIDECTOMY  2006   for thyroid cancer    Current Medications: Current Meds  Medication Sig   acetaminophen (TYLENOL) 325 MG tablet Take 2 tablets (650 mg total) by mouth every 6 (six) hours as needed for moderate pain.   amLODipine (NORVASC) 10 MG tablet Take 10 mg by mouth daily.   apixaban (ELIQUIS) 5 MG TABS tablet Take 1 tablet (5 mg total) by mouth 2 (two) times daily.   atorvastatin (LIPITOR) 80 MG tablet Take 1 tablet (80 mg total) by mouth every evening.   busPIRone (BUSPAR) 5 MG tablet TAKE 1 TABLET(5 MG) BY MOUTH THREE TIMES DAILY   Fexofenadine HCl (ALLEGRA PO) Take 10 mg by mouth 2 (two) times daily.   fluticasone (FLONASE) 50 MCG/ACT nasal spray Place 1 spray into both nostrils in the morning and at bedtime.   furosemide (LASIX) 40 MG tablet Take 1 tablet (40 mg total) by mouth 2 (two) times daily.   gabapentin (NEURONTIN) 300 MG capsule Take 1 capsule (300 mg total) by mouth as needed.   insulin aspart (NOVOLOG) 100 UNIT/ML injection Inject 5 Units into the skin 3 (three) times daily before meals. For blood sugar greater than or equal to 200   Insulin Human (INSULIN PUMP) SOLN Inject 1 each into the skin 3 times daily with meals, bedtime and 2 AM.   levothyroxine (SYNTHROID) 175 MCG tablet Take 1 tablet (175 mcg total) by mouth daily before breakfast.   loratadine (CLARITIN) 10 MG tablet Take 10 mg by mouth every evening.   metoprolol succinate (TOPROL-XL) 50 MG 24 hr tablet Take 1 tablet (50 mg total) by mouth daily. Take with or immediately following a meal.   pantoprazole (PROTONIX) 40 MG tablet Take 1 tablet (40 mg total) by mouth 2 (two) times daily.   polyethylene glycol powder (GLYCOLAX/MIRALAX) 17 GM/SCOOP powder Take 17 g by mouth daily. (Patient taking differently: Take 17 g by mouth as needed.)   polyethylene glycol powder (GLYCOLAX/MIRALAX) 17 GM/SCOOP powder Take 17 g by mouth 2 (two) times daily as needed for moderate  constipation.   potassium chloride (KLOR-CON) 10 MEQ tablet Take 1 tablet (10 mEq total) by mouth daily.   traMADol (ULTRAM) 50 MG tablet Take 1 tablet (50 mg total) by mouth every 6 (six) hours as needed.     Allergies:   Latex   Social History   Socioeconomic History   Marital status: Divorced    Spouse name: Not on file   Number of children: 1   Years of education: Not on file   Highest education level: Not on file  Occupational History   Occupation: retired Pharmacist, hospital  Tobacco Use   Smoking status: Never   Smokeless tobacco: Never  Vaping Use   Vaping Use: Never used  Substance and Sexual Activity   Alcohol use: No   Drug use: No   Sexual activity: Not on file  Other Topics Concern   Not on file  Social History Narrative   ** Merged History Encounter **       Retired Pharmacist, hospital, high school teacher taught mass.  One son, helping to care for her granddaughter. No caffeine. Updated 07/20/2014.   Social Determinants of Health   Financial Resource Strain: Not on file  Food Insecurity: Food Insecurity Present (08/02/2022)   Hunger Vital Sign    Worried About Running Out of Food in the Last Year: Sometimes true    Ran Out of Food in the Last Year: Sometimes true  Transportation Needs: Unmet Transportation Needs (06/17/2020)   PRAPARE - Hydrologist (Medical): Yes    Lack of Transportation (Non-Medical): Yes  Physical Activity: Not on file  Stress: Not on file  Social Connections: Not on file     Family History: The patient's family history includes Diabetes in her maternal aunt; Early death in her father; Heart attack in her maternal grandfather and mother; Heart disease in her maternal aunt, maternal grandfather, and mother; Heart failure in her maternal grandfather; Hyperlipidemia in her father and mother; Hypertension in her father and mother; Prostate cancer in her paternal uncle; Renal Disease in her mother; Stroke in her son. There is no history  of Breast cancer.  ROS:   Please see the history of present illness.     All other systems reviewed and are negative.  EKGs/Labs/Other Studies Reviewed:    The following studies were reviewed today: Cardiac event monitor 10/28/2018- 1 episode of 5 beats nonsustained ventricular tachycardia no atrial fibrillation occasional PACs   TTE 10/14/18  Review of the above records today demonstrates:  - Left ventricle: The cavity size was normal. There was moderate   concentric hypertrophy. Systolic function was normal. The   estimated ejection fraction was in the range of 60% to 65%. Wall   motion was normal; there were no regional wall motion   abnormalities. The study is not technically sufficient to allow   evaluation of LV diastolic function. - Left atrium: The atrium was normal in size. - Right atrium: The atrium was normal in size.    Recent Labs: 02/11/2022: ALT 10; Magnesium 1.7 05/29/2022: B Natriuretic Peptide 190.5; BUN 10; Creatinine, Ser 1.07; Hemoglobin 10.9; Platelets 203; Potassium 3.6; Sodium 142; TSH 2.658  Recent Lipid Panel    Component Value Date/Time   CHOL 102 08/04/2021 2313   CHOL 112 06/06/2021 1127   TRIG 39 08/04/2021 2313   HDL 44 08/04/2021 2313   HDL 50 06/06/2021 1127   CHOLHDL 2.3 08/04/2021 2313   VLDL 8 08/04/2021 2313   LDLCALC 50 08/04/2021 2313   LDLCALC 49 06/06/2021 1127   LDLDIRECT 75 01/25/2012 1508     Risk Assessment/Calculations:              Physical Exam:    VS:  BP 130/70 (BP Location: Left Arm, Patient Position: Sitting, Cuff Size: Large)   Pulse 69   Ht '5\' 7"'$  (1.702 m)   Wt 253 lb (114.8 kg)   SpO2 96%   BMI 39.63 kg/m     Wt Readings from Last 3 Encounters:  09/11/22 253 lb (114.8 kg)  07/27/22 253 lb (114.8 kg)  03/02/22 243 lb (110.2 kg)     GEN:  Well nourished, well developed in no acute distress HEENT: Normal NECK: No JVD; No carotid bruits LYMPHATICS: No lymphadenopathy CARDIAC: Occasional ectopy RRR, no  murmurs, no rubs, gallops RESPIRATORY:  Clear to auscultation without rales, wheezing or rhonchi  ABDOMEN: Soft, non-tender, non-distended MUSCULOSKELETAL: Chronic pedal edema; No deformity  SKIN: Warm and dry NEUROLOGIC:  Alert and oriented x 3 PSYCHIATRIC:  Normal affect   ASSESSMENT:    1. Paroxysmal atrial fibrillation (HCC)   2. Cardiac pacemaker in situ   3. Primary hypertension   4. Pulmonary HTN ([place])    PLAN:    In order of problems listed above:  PAF -On Eliquis, $40 a month.  Asking for alternatives but unfortunately there are none for her.  On O2 continuous.  -Toprol '50mg'$  continuing.  Doing well.  Has pacemaker.  Pacemaker - Dr. Curt Bears, [employer] -Functioning well.   Prior stroke - 3rd, left leg weakness -Finally atrial fibrillation was detected   Hyperlipidemia - High intensity statin therapy, goal LDL less than 70, LDL currently 50.  Excellent.  Creatinine 1.0.   Diabetes with essential hypertension -Continue with antihypertensives.  Blood pressure under good control currently.  No changes made.  On Toprol-XL 50 mg Lasix 40 mg twice a day   Morbid obesity -Continue to encourage weight loss.   Secondary pulmonary hypertension - Secondary to weight.   GERD -Improved with PPI.  Wound on Foot --slow healing foot wound.  Sees wound clinic.          Medication Adjustments/Labs and Tests Ordered: Current medicines are reviewed at length with the patient today.  Concerns regarding medicines are outlined above.  No orders of the defined types were placed in this encounter.  No orders of the defined types were placed in this encounter.   Patient Instructions  Medication Instructions:  The current medical regimen is effective;  continue present plan and medications.  *If you need a refill on your cardiac medications before your next appointment, please call your pharmacy*  Follow-Up: At [facility], you and your health needs are our  priority.  As part of our continuing mission to provide you with exceptional heart care, we have created designated Provider Care Teams.  These Care Teams include your primary Cardiologist (physician) and Advanced Practice Providers (APPs -  Physician Assistants and Nurse Practitioners) who all work together to provide you with the care you need, when you need it.  We recommend signing up for the patient portal called "MyChart".  Sign up information is provided on this After Visit Summary.  MyChart is used to connect with patients for Virtual Visits (Telemedicine).  Patients are able to view lab/test results, encounter notes, upcoming appointments, etc.  Non-urgent messages can be sent to your provider as well.   To learn more about what you can do with MyChart, go to [email or web address].    Your next appointment:   6 month(s)  The format for your next appointment:   In Person  Provider:   Nicholes Rough, PA-C, Melina Copa, PA-C, Ambrose Pancoast, NP, Ermalinda Barrios, PA-C, Christen Bame, NP, or Richardson Dopp, PA-C          Important Information About Sugar         Signed, Candee Furbish, MD  09/11/2022 4:20 PM    [place]

## 2022-09-11 NOTE — Patient Instructions (Signed)
Medication Instructions:  The current medical regimen is effective;  continue present plan and medications.  *If you need a refill on your cardiac medications before your next appointment, please call your pharmacy*  Follow-Up: At [facility], you and your health needs are our priority.  As part of our continuing mission to provide you with exceptional heart care, we have created designated Provider Care Teams.  These Care Teams include your primary Cardiologist (physician) and Advanced Practice Providers (APPs -  Physician Assistants and Nurse Practitioners) who all work together to provide you with the care you need, when you need it.  We recommend signing up for the patient portal called "MyChart".  Sign up information is provided on this After Visit Summary.  MyChart is used to connect with patients for Virtual Visits (Telemedicine).  Patients are able to view lab/test results, encounter notes, upcoming appointments, etc.  Non-urgent messages can be sent to your provider as well.   To learn more about what you can do with MyChart, go to [email or web address].    Your next appointment:   6 month(s)  The format for your next appointment:   In Person  Provider:   Nicholes Rough, PA-C, Melina Copa, PA-C, Ambrose Pancoast, NP, Ermalinda Barrios, PA-C, Christen Bame, NP, or Richardson Dopp, PA-C          Important Information About Sugar

## 2022-09-12 DIAGNOSIS — I639 Cerebral infarction, unspecified: Secondary | ICD-10-CM | POA: Diagnosis not present

## 2022-09-12 DIAGNOSIS — Z950 Presence of cardiac pacemaker: Secondary | ICD-10-CM | POA: Diagnosis not present

## 2022-09-12 DIAGNOSIS — I442 Atrioventricular block, complete: Secondary | ICD-10-CM | POA: Diagnosis not present

## 2022-09-13 ENCOUNTER — Encounter (HOSPITAL_BASED_OUTPATIENT_CLINIC_OR_DEPARTMENT_OTHER): Payer: Medicare PPO | Admitting: General Surgery

## 2022-09-13 DIAGNOSIS — I890 Lymphedema, not elsewhere classified: Secondary | ICD-10-CM | POA: Diagnosis not present

## 2022-09-13 DIAGNOSIS — E109 Type 1 diabetes mellitus without complications: Secondary | ICD-10-CM | POA: Diagnosis not present

## 2022-09-13 DIAGNOSIS — Z9641 Presence of insulin pump (external) (internal): Secondary | ICD-10-CM | POA: Diagnosis not present

## 2022-09-13 DIAGNOSIS — E1042 Type 1 diabetes mellitus with diabetic polyneuropathy: Secondary | ICD-10-CM | POA: Diagnosis not present

## 2022-09-13 DIAGNOSIS — Z85850 Personal history of malignant neoplasm of thyroid: Secondary | ICD-10-CM | POA: Diagnosis not present

## 2022-09-13 DIAGNOSIS — I639 Cerebral infarction, unspecified: Secondary | ICD-10-CM | POA: Diagnosis not present

## 2022-09-13 DIAGNOSIS — E10622 Type 1 diabetes mellitus with other skin ulcer: Secondary | ICD-10-CM | POA: Diagnosis not present

## 2022-09-13 DIAGNOSIS — E7800 Pure hypercholesterolemia, unspecified: Secondary | ICD-10-CM | POA: Diagnosis not present

## 2022-09-13 DIAGNOSIS — Z6841 Body Mass Index (BMI) 40.0 and over, adult: Secondary | ICD-10-CM | POA: Diagnosis not present

## 2022-09-13 DIAGNOSIS — E11319 Type 2 diabetes mellitus with unspecified diabetic retinopathy without macular edema: Secondary | ICD-10-CM | POA: Diagnosis not present

## 2022-09-13 DIAGNOSIS — E11621 Type 2 diabetes mellitus with foot ulcer: Secondary | ICD-10-CM | POA: Diagnosis not present

## 2022-09-13 DIAGNOSIS — I10 Essential (primary) hypertension: Secondary | ICD-10-CM | POA: Diagnosis not present

## 2022-09-13 DIAGNOSIS — E1059 Type 1 diabetes mellitus with other circulatory complications: Secondary | ICD-10-CM | POA: Diagnosis not present

## 2022-09-13 DIAGNOSIS — I5032 Chronic diastolic (congestive) heart failure: Secondary | ICD-10-CM | POA: Diagnosis not present

## 2022-09-13 DIAGNOSIS — C73 Malignant neoplasm of thyroid gland: Secondary | ICD-10-CM | POA: Diagnosis not present

## 2022-09-13 DIAGNOSIS — Z794 Long term (current) use of insulin: Secondary | ICD-10-CM | POA: Diagnosis not present

## 2022-09-13 DIAGNOSIS — L97522 Non-pressure chronic ulcer of other part of left foot with fat layer exposed: Secondary | ICD-10-CM | POA: Diagnosis not present

## 2022-09-13 DIAGNOSIS — E890 Postprocedural hypothyroidism: Secondary | ICD-10-CM | POA: Diagnosis not present

## 2022-09-13 DIAGNOSIS — E039 Hypothyroidism, unspecified: Secondary | ICD-10-CM | POA: Diagnosis not present

## 2022-09-13 NOTE — Progress Notes (Signed)
[name], [name] ([id number]) Visit Report for 09/13/2022 Arrival Information Details Patient Name: Date of Service: [place], [name] 09/13/2022 3:15 PM Medical Record Number: [id number] Patient Account Number: [id number] Date of Birth/Sex: Treating RN: November 11, 1951 (71 y.o. [name] Primary Care Tristin Gladman: Jim Like Other Clinician: Referring Skylyn Slezak: Treating Dacey Milberger/Extender: Rory Percy in Treatment: 23 Visit Information History Since Last Visit All ordered tests and consults were completed: Yes Patient Arrived: Wheel Chair Added or deleted any medications: No Arrival Time: 15:19 Any new allergies or adverse reactions: No Accompanied By: self Had a fall or experienced change in No Transfer Assistance: Other activities of daily living that may affect Patient Identification Verified: Yes risk of falls: Secondary Verification Process Completed: Yes Signs or symptoms of abuse/neglect since last visito No Patient Requires Transmission-Based Precautions: No Hospitalized since last visit: No Patient Has Alerts: Yes Implantable device outside of the clinic excluding No Patient Alerts: Patient on Blood Thinner cellular tissue based products 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 09/13/2022 3:15 PM Medical Record Number: [id number] Patient Account Number: [id number] Date of Birth/Sex: Treating RN: 1951/10/26 (71 y.o. [name], [name] Primary Care [name]: Jim Like Other Clinician: Referring [name]: Treating [name]/Extender: Rory Percy in  Treatment: 23 Edema Assessment Assessed: [Left: No] Patrice Paradise: No] [Left: Edema] [Right: :] Calf Left: Right: Point of Measurement: From Medial Instep 40 cm Ankle Left: Right: Point of Measurement: From Medial Instep 25 cm Vascular Assessment Pulses: Dorsalis Pedis Palpable: [Left:Yes] Electronic Signature(s) Signed: 09/13/2022 5:15:08 PM By: Blanche East RN Entered By: Blanche East on 09/13/2022 15:21:00 -------------------------------------------------------------------------------- Multi Wound Chart Details Patient Name: Date of Service: [name], [name] 09/13/2022 3:15 PM Medical Record Number: [id number] Patient Account Number: [id number] Date of Birth/Sex: Treating RN: 1951/12/23 (71 y.o. F) Primary Care [name]: Jim Like Other Clinician: Referring [name]: Treating [name]/Extender: Rory Percy in Treatment: 23 Vital Signs Height(in): 67 Pulse(bpm): 80 Weight(lbs): 243 Blood Pressure(mmHg): 164/79 Body Mass Index(BMI): 38.1 Temperature(F): 97.9 Respiratory Rate(breaths/min): 18 Photos: [N/A:N/A] Left Foot N/A N/A Wound Location: Pressure Injury N/A N/A Wounding Event: Diabetic Wound/Ulcer of the Lower N/A N/A Primary Etiology: Extremity Cataracts, Anemia, Sleep Apnea, N/A N/A Comorbid History: Congestive Heart Failure, Hypertension, Type I Diabetes, Received Radiation 06/16/2022 N/A N/A Date Acquired: 10 N/A N/A Weeks of Treatment: Open N/A N/A Wound Status: No N/A N/A Wound Recurrence: 0.5x0.4x0.1 N/A N/A Measurements L x W x D (cm) 0.157 N/A N/A A (cm) : rea 0.016 N/A N/A Volume (cm) : 90.50% N/A N/A % Reduction in A rea: 90.30% N/A N/A % Reduction in Volume: Grade 2 N/A N/A Classification: Medium N/A N/A Exudate A mount: Serosanguineous N/A N/A Exudate Type: red, brown N/A N/A Exudate Color: Distinct, outline attached N/A N/A Wound Margin: Medium (34-66%) N/A N/A Granulation A  mount: Red N/A N/A Granulation Quality: Medium (34-66%) N/A N/A Necrotic A mount: Fat Layer (Subcutaneous Tissue): Yes N/A N/A Exposed Structures: Fascia: No Tendon: No Muscle: No Joint: No Bone: No Small (1-33%) N/A N/A Epithelialization: Debridement - Selective/Open Wound N/A N/A Debridement: Pre-procedure Verification/Time Out 15:28 N/A N/A Taken: Lidocaine 5% topical ointment N/A N/A Pain Control: Callus N/A N/A Tissue Debrided: Skin/Epidermis N/A N/A Level: 0.2  N/A N/A Debridement A (sq cm): rea Curette N/A N/A Instrument: None N/A N/A Bleeding: 0 N/A N/A Procedural Pain: 0 N/A N/A Post Procedural Pain: Procedure was tolerated well N/A N/A Debridement Treatment Response: 0.5x0.4x0.1 N/A N/A Post Debridement Measurements L x W x D (cm) 0.016 N/A N/A Post Debridement Volume: (cm) Debridement N/A N/A Procedures Performed: Treatment Notes Electronic Signature(s) Signed: 09/13/2022 3:47:38 PM By: Fredirick Maudlin MD FACS Entered By: Fredirick Maudlin on 09/13/2022 15:47:38 -------------------------------------------------------------------------------- Multi-Disciplinary Care Plan Details Patient Name: Date of Service: [name], [name] [place] [name] 09/13/2022 3:15 PM Medical Record Number: [id number] Patient Account Number: [id number] Date of Birth/Sex: Treating RN: Aug 01, 1951 (71 y.o. [name], [name] Primary Care Margarito Dehaas: Jim Like Other Clinician: Referring Emberleigh Reily: Treating Petra Sargeant/Extender: Rory Percy in Treatment: 23 Multidisciplinary Care Plan reviewed with physician Active Inactive Abuse / Safety / Falls / Self Care Management Nursing Diagnoses: History of Falls Impaired physical mobility Goals: Patient/caregiver will verbalize/demonstrate measures taken to prevent injury and/or falls Date Initiated: 04/03/2022 Target Resolution Date: 09/22/2022 Goal Status: Active Interventions: Assess fall risk on admission and  as needed Assess: immobility, friction, shearing, incontinence upon admission and as needed Assess impairment of mobility on admission and as needed per policy Notes: Nutrition Nursing Diagnoses: Impaired glucose control: actual or potential Potential for alteratiion in Nutrition/Potential for imbalanced nutrition Goals: Patient/caregiver will maintain therapeutic glucose control Date Initiated: 04/03/2022 Target Resolution Date: 09/22/2022 Goal Status: Active Interventions: Assess HgA1c results as ordered upon admission and as needed Provide education on elevated blood sugars and impact on wound healing Treatment Activities: Patient referred to Primary Care Physician for further nutritional evaluation : 04/03/2022 Notes: Venous Leg Ulcer Nursing Diagnoses: Knowledge deficit related to disease process and management Potential for venous Insuffiency (use before diagnosis confirmed) Goals: Patient will maintain optimal edema control Date Initiated: 04/03/2022 Target Resolution Date: 09/22/2022 Goal Status: Active Interventions: Assess peripheral edema status every visit. Compression as ordered Provide education on venous insufficiency Treatment Activities: Therapeutic compression applied : 04/03/2022 Notes: Electronic Signature(s) Signed: 09/13/2022 5:15:08 PM By: Blanche East RN Entered By: Blanche East on 09/13/2022 15:30:26 -------------------------------------------------------------------------------- Pain Assessment Details Patient Name: Date of Service: [name], [name] 09/13/2022 3:15 PM Medical Record Number: [id number] Patient Account Number: [id number] Date of Birth/Sex: Treating RN: 08-21-1951 (71 y.o. [name] Primary Care Tajuana Kniskern: Jim Like Other Clinician: Referring Jeananne Bedwell: Treating Macenzie Burford/Extender: Rory Percy in Treatment: 23 Active Problems Location of Pain Severity and Description of Pain Patient Has  Paino No Site Locations Pain Management and Medication Current Pain Management: Electronic Signature(s) Signed: 09/13/2022 5:15:08 PM By: Blanche East RN Entered By: Blanche East on 09/13/2022 15:20:39 -------------------------------------------------------------------------------- Patient/Caregiver Education Details Patient Name: Date of Service: Jacqulyn Ducking 9/20/2023andnbsp3:15 PM Medical Record Number: [id number] Patient Account Number: [id number] Date of Birth/Gender: Treating RN: 09-26-1951 (71 y.o. [name] Primary Care Physician: Jim Like Other Clinician: Referring Physician: Treating Physician/Extender: Rory Percy in Treatment: 23 Education Assessment Education Provided To: Patient Education Topics Provided Elevated Blood Sugar/ Impact on Healing: Methods: Explain/Verbal Responses: Reinforcements needed, State content correctly Venous: Methods: Explain/Verbal Responses: Reinforcements needed, State content correctly Electronic Signature(s) Signed: 09/13/2022 5:15:08 PM By: Blanche East RN Entered By: Blanche East on 09/13/2022 15:30:41 -------------------------------------------------------------------------------- Wound Assessment Details Patient Name: Date of Service: [name], [name] 09/13/2022 3:15 PM Medical Record Number: [id number] Patient Account Number: [id number] Date of Birth/Sex: Treating RN: 13-Apr-1951 (71 y.o. [name] Primary Care Damiano Stamper:  Jim Like Other Clinician: Referring Saydee Zolman: Treating Tyrene Nader/Extender: Rory Percy in Treatment: 23 Wound Status Wound Number: 11 Primary Diabetic Wound/Ulcer of the Lower Extremity Etiology: Wound Location: Left Foot Wound Open Wounding Event: Pressure Injury Status: Date Acquired: 06/16/2022 Comorbid Cataracts, Anemia, Sleep Apnea, Congestive Heart Failure, Weeks Of Treatment: 10 History: Hypertension,  Type I Diabetes, Received Radiation Clustered Wound: No Photos Wound Measurements Length: (cm) 0.5 Width: (cm) 0.4 Depth: (cm) 0.1 Area: (cm) 0.157 Volume: (cm) 0.016 % Reduction in Area: 90.5% % Reduction in Volume: 90.3% Epithelialization: Small (1-33%) Tunneling: No Undermining: No Wound Description Classification: Grade 2 Wound Margin: Distinct, outline attached Exudate Amount: Medium Exudate Type: Serosanguineous Exudate Color: red, brown Foul Odor After Cleansing: No Slough/Fibrino Yes Wound Bed Granulation Amount: Medium (34-66%) Exposed Structure Granulation Quality: Red Fascia Exposed: No Necrotic Amount: Medium (34-66%) Fat Layer (Subcutaneous Tissue) Exposed: Yes Necrotic Quality: Adherent Slough Tendon Exposed: No Muscle Exposed: No Joint Exposed: No Bone Exposed: No Electronic Signature(s) Signed: 09/13/2022 5:15:08 PM By: Blanche East RN Entered By: Blanche East on 09/13/2022 15:29:21 -------------------------------------------------------------------------------- Vitals Details Patient Name: Date of Service: [name], [name] 09/13/2022 3:15 PM Medical Record Number: [id number] Patient Account Number: [id number] Date of Birth/Sex: Treating RN: 1951-11-25 (71 y.o. [name], Volin Primary Care Matej Sappenfield: Jim Like Other Clinician: Referring Kala Ambriz: Treating Rollen Selders/Extender: Rory Percy in Treatment: 23 Vital Signs Time Taken: 15:19 Temperature (F): 97.9 Height (in): 67 Pulse (bpm): 65 Weight (lbs): 243 Respiratory Rate (breaths/min): 18 Body Mass Index (BMI): 38.1 Blood Pressure (mmHg): 164/79 Reference Range: 80 - 120 mg / dl Airway Pulse Oximetry (%): 95 Inhaled Oxygen Concentration (%): 1 Electronic Signature(s) Signed: 09/13/2022 5:15:08 PM By: Blanche East RN Entered By: Blanche East on 09/13/2022 15:20:34

## 2022-09-14 NOTE — Progress Notes (Signed)
[name], [name] Estrella [name] ([id number]) Visit Report for 09/13/2022 Chief Complaint Document Details Patient Name: Date of Service: [name], GUIN. 09/13/2022 3:15 PM Medical Record Number: [id number] Patient Account Number: [id number] Date of Birth/Sex: Treating RN: February 28, 1951 (71 y.o. F) Primary Care Provider: Jim Like Other Clinician: Referring Provider: Treating Provider/Extender: Rory Percy in Treatment: 23 Information Obtained from: Patient Chief Complaint Patient presents for treatment of open ulcers due to venous insufficiency in the setting of poorly controlled DM1 and CHF Electronic Signature(s) Signed: 09/13/2022 3:47:45 PM By: Fredirick Maudlin MD FACS Entered By: Fredirick Maudlin on 09/13/2022 15:47:45 -------------------------------------------------------------------------------- Debridement Details Patient Name: Date of Service: [name], [name] 09/13/2022 3:15 PM Medical Record Number: [id number] Patient Account Number: [id number] Date of Birth/Sex: Treating RN: 06-25-1951 (71 y.o. Iver Nestle, [place] Primary Care Provider: Jim Like Other Clinician: Referring Provider: Treating Provider/Extender: Rory Percy in Treatment: 23 Debridement Performed for Assessment: Wound #11 Left Foot Performed By: Physician Fredirick Maudlin, MD Debridement Type: Debridement Severity of Tissue Pre Debridement: Fat layer exposed Level of Consciousness (Pre-procedure): Awake and Alert Pre-procedure Verification/Time Out Yes - 15:28 Taken: Start Time: 15:29 Pain Control: Lidocaine 5% topical ointment T Area Debrided (L x W): otal 0.5 (cm) x 0.4 (cm) = 0.2 (cm) Tissue and other material debrided: Viable, Callus, Skin: Epidermis Level: Skin/Epidermis Debridement Description: Selective/Open Wound Instrument: Curette Bleeding: None Procedural Pain: 0 Post Procedural Pain: 0 Response to Treatment: Procedure was  tolerated well Level of Consciousness (Post- Awake and Alert procedure): Post Debridement Measurements of Total Wound Length: (cm) 0.5 Width: (cm) 0.4 Depth: (cm) 0.1 Volume: (cm) 0.016 Character of Wound/Ulcer Post Debridement: Improved Severity of Tissue Post Debridement: Fat layer exposed Post Procedure Diagnosis Same as Pre-procedure Notes Scribed for Dr. Celine Ahr by Blanche East, RN Electronic Signature(s) Signed: 09/13/2022 4:08:58 PM By: Fredirick Maudlin MD FACS Signed: 09/13/2022 5:15:08 PM By: Blanche East RN Entered By: Blanche East on 09/13/2022 15:37:27 -------------------------------------------------------------------------------- HPI Details Patient Name: Date of Service: [name], [name] 09/13/2022 3:15 PM Medical Record Number: [id number] Patient Account Number: [id number] Date of Birth/Sex: Treating RN: [birth year] (71 y.o. F) Primary Care Provider: Jim Like Other Clinician: Referring Provider: Treating Provider/Extender: Rory Percy in Treatment: 23 History of Present Illness HPI Description: ADMISSION 04/03/2022 This is a 71 year old woman with a past medical history notable for type 1 diabetes mellitus, poorly controlled with her most recent A1c being 8.3, congestive heart failure with recent admission for exacerbation secondary to noncompliance with diuretics, as well as history of pontine stroke and oxygen dependence. She has wounds on her bilateral lower extremities. She says they have been present for about 2 months and initially started as blisters. She says that she has compression stockings, but does not wear them because they are too difficult to get on. ABIs done in clinic were 1.4 and 1.5, suggesting some degree of noncompressibility. She also has a wound on her right heel that she says is secondary to it getting bumped frequently as she transfers. She came to clinic with just dry dressings on her legs. She does have  home health care, as well as her sister that have been assisting her with her dressings. She has multiple wounds. On the left medial calf, just above her ankle, there is a somewhat geographic wound with slough and some epithelialization. On the anterior tibial surface and lateral calf, the wounds are quite desiccated with yellow eschar present. On the right anterior  tibial surface, the wounds are gritty with thin slough and eschar. The posterior right ankle wound is just above the calcaneus overlying the Achilles and has thick yellow-gray slough and a bit of odor. No purulent drainage appreciated. 04/10/2022: The wounds on her left leg are closed. On the right, the anterior tibial surface wounds are epithelializing and much cleaner. There is just a layer of biofilm on the open areas. On the posterior right ankle wound, there is a decreased volume of slough and no odor. We have been using Hydrofera Blue to all sites under compression. 04/17/2022: The wounds on her left leg remained closed and she did bring her juxta lite stockings with her today. On the right, the more distal wound is closed and the proximal wound has epithelialized significantly since her last visit. At the posterior right ankle, she still has accumulated some slough and some periwound callus but there is no odor or significant drainage. 04/24/2022: The right anterior leg wounds are closed. She still has a wound on her posterior right ankle that looks about the same as last week. She did not bring her right sided juxta light with her today but is wearing the left sided stocking. 05/01/2022: She has a tiny abrasion on her left anterior tibia which we did not add to her wound count today. The wound on her posterior right ankle is a little bit smaller with a little accumulated slough. No drainage or concern for infection. 05/08/2022: She continues to forget to bring her juxta lite stocking with her so she remains in a compression wrap despite  healing of the right anterior leg wounds. The wound on her posterior right ankle/calcaneus is a bit smaller today with just a little bit of accumulated slough. She is wearing a juxta lite stocking on the left leg. 05/15/2022: She says that she has been wearing her juxta lite stockings at home, but today she is wearing regular compression stockings that she purchased from [employer]; the juxta lite stockings are in the laundry. The wound on her posterior right ankle/calcaneus continues to contract and has just a little bit of accumulated slough. Unfortunately, her edema control is suboptimal on the left and she has developed several small blisters with a larger 1 at the medial calf. 05/23/2022: The patient has been wearing her juxta lite stockings. Unfortunately, she has developed a large blister on her right lower leg just above the ankle. The skin overlying it is still intact. The blister that had opened on her left medial calf is smaller; and the other small blistered sites are closed. The posterior right ankle/calcaneus is about the same today. 06/07/2022: The only remaining open wound is on her posterior right calcaneus. The majority of this is covered with eschar. 06/30/2022: I am not sure why the patient did not return to clinic until today, but the ulcer on her right heel has healed. Unfortunately, she has developed a new ulcer on her left lateral foot. From the position of it, it looks as though the outward rotation of her lower leg at rest is probably causing the lateral midfoot to rub or press against the metal leg rests of her wheelchair. She is not padding the chair when she rests her leg on the leg rests. The new ulcer is fairly superficial with just the fat layer exposed. It is quite tender. No erythema, induration, or purulent drainage identified. 07/12/2022: After our last visit, she was seen by podiatry. Nail care was performed. For some reason he had her  apply silicone toe covers on her  bilateral great toes. As result, the skin is now very macerated and she has a new ulcer on the knuckle of her right great toe. It is fairly small but does expose the fat layer. No erythema induration or purulent drainage although there was some odor on her feet when the caps were removed. The wound on her left lateral foot is smaller with just a small amount of slough buildup. Her Prevalon boots arrived and we will put her in those after her dressing is changed. 08/02/2022: She has been wearing her Prevalon boots to some extent, but says that she spends most of her day in her wheelchair and the boots slide off the foot rest. She apparently has not been utilizing the leg strap. The left lateral foot wound is tender and the tissue has some discoloration suggestive of ongoing pressure in this area. There is a little bit of callus accumulation circumferentially. The wound on her right great toe DIP joint is nearly closed and just has a light layer of eschar overlying the surface. No concern for infection. 08/16/2022: The wound on her right great toe has healed. The wound on her left lateral foot is quite a bit smaller with good perimeter epithelialization and a light layer of slough on the surface. 08/30/2022: Today she is complaining of a lot more pain on her left lateral foot. I think we are struggling with adequate padding and prevention of pressure in the site. The wound opening itself is quite small, but there is circumferential undermining from old skin. There is no odor or purulent drainage. 09/13/2022: The lateral foot wound looks significantly improved today. It is smaller and I no longer detect the undermining. Electronic Signature(s) Signed: 09/13/2022 3:48:23 PM By: Fredirick Maudlin MD FACS Entered By: Fredirick Maudlin on 09/13/2022 15:48:23 -------------------------------------------------------------------------------- Physical Exam Details Patient Name: Date of Service: [name], [name]  09/13/2022 3:15 PM Medical Record Number: [id number] Patient Account Number: [id number] Date of Birth/Sex: Treating RN: 1951/05/23 (71 y.o. F) Primary Care Provider: Jim Like Other Clinician: Referring Provider: Treating Provider/Extender: Rory Percy in Treatment: 23 Constitutional Hypertensive, asymptomatic. . . . No acute distress.Marland Kitchen Respiratory Normal work of breathing on room air.. Notes 09/13/2022: The lateral foot wound looks significantly improved today. It is smaller and I no longer detect the undermining. Electronic Signature(s) Signed: 09/13/2022 3:49:01 PM By: Fredirick Maudlin MD FACS Entered By: Fredirick Maudlin on 09/13/2022 15:49:00 -------------------------------------------------------------------------------- Physician Orders Details Patient Name: Date of Service: [name], [name] 09/13/2022 3:15 PM Medical Record Number: [id number] Patient Account Number: [id number] Date of Birth/Sex: Treating RN: 1951-11-20 (71 y.o. Iver Nestle, Jamie Primary Care Provider: Jim Like Other Clinician: Referring Provider: Treating Provider/Extender: Rory Percy in Treatment: 23 Verbal / Phone Orders: No Diagnosis Coding ICD-10 Coding Code Description I89.0 Lymphedema, not elsewhere classified T70.01 Chronic diastolic (congestive) heart failure E10.59 Type 1 diabetes mellitus with other circulatory complications V49.44 Type 1 diabetes mellitus with diabetic polyneuropathy Z68.41 Body mass index [BMI]40.0-44.9, adult E10.622 Type 1 diabetes mellitus with other skin ulcer L97.522 Non-pressure chronic ulcer of other part of left foot with fat layer exposed Follow-up Appointments ppointment in 2 weeks. - Dr. Celine Ahr - room 4 Return A New prescription for Gentamicin sent to pharmacy. Anesthetic Wound #11 Left Foot (In clinic) Topical Lidocaine 5% applied to wound bed - in clinic, prior to debridement Bathing/ Shower/  Hygiene May shower and wash wound with soap and water. -  with dressing changes Edema Control - Lymphedema / SCD / Other Bilateral Lower Extremities Elevate legs to the level of the heart or above for 30 minutes daily and/or when sitting, a frequency of: - throughout the day Patient to wear own compression stockings every day. - juxtalites to both legs Exercise regularly Off-Loading Other: - Prevalon boot to both feet. Please put pillow between foot and foot rest so the foot is not resting on the metal footrest. Home Health Admit to [facility] for wound care. May utilize formulary equivalent dressing for wound treatment orders unless otherwise specified. New wound care orders this week; continue Home Health for wound care. May utilize formulary equivalent dressing for wound treatment orders unless otherwise specified. - No need for gentamycin at this time Other Home Health Orders/Instructions: [facility] Wound Treatment Wound #11 - Foot Wound Laterality: Left Cleanser: Soap and Water Every Other Day/15 Days Discharge Instructions: May shower and wash wound with dial antibacterial soap and water prior to dressing change. Cleanser: Wound Cleanser Every Other Day/15 Days Discharge Instructions: Cleanse the wound with wound cleanser prior to applying a clean dressing using gauze sponges, not tissue or cotton balls. Prim Dressing: KerraCel Ag Gelling Fiber Dressing, 2x2 in (silver alginate) (Generic) Every Other Day/15 Days ary Discharge Instructions: Apply silver alginate to wound bed as instructed Secondary Dressing: Optifoam Non-Adhesive Dressing, 4x4 in Every Other Day/15 Days Discharge Instructions: Apply over primary dressing as directed. Secondary Dressing: Woven Gauze Sponge, Non-Sterile 4x4 in Every Other Day/15 Days Discharge Instructions: Apply over primary dressing as directed. Secured With: [employer], 4.5x3.1 (in/yd) Every Other Day/15 Days Discharge Instructions: Secure  with Kerlix as directed. Secured With: 101M Medipore Public affairs consultant Surgical T 2x10 (in/yd) Every Other Day/15 Days ape Discharge Instructions: Secure with tape as directed. Patient Medications llergies: latex A Notifications Medication Indication Start End prior to debridement 09/13/2022 lidocaine DOSE topical 5 % cream - cream topical Electronic Signature(s) Signed: 09/13/2022 5:15:08 PM By: Blanche East RN Signed: 09/14/2022 9:25:12 AM By: Fredirick Maudlin MD FACS Previous Signature: 09/13/2022 4:08:58 PM Version By: Fredirick Maudlin MD FACS Entered By: Blanche East on 09/13/2022 16:46:56 Prescription 09/13/2022 -------------------------------------------------------------------------------- [name], [name] [name] Fredirick Maudlin MD Patient Name: Provider: Nov 28, 1951 [id number] Date of Birth: NPI#: [id number] Sex: DEA #: [phone] Phone #: License #: [place] Patient Address: [address] [facility] [place], [place] [address] [place], [place] [address] [phone] Allergies latex Medication Medication: Route: Strength: Form: lidocaine 5 % topical cream topical 5% cream Class: HEMORRHOIDALS, LOCAL RECTAL ANESTHETICS Dose: Frequency / Time: Indication: cream topical prior to debridement Number of Refills: Number of Units: 0 Generic Substitution: Start Date: End Date: One Time Use: Substitution Permitted 5/63/1497 No Note to Pharmacy: Hand Signature: Date(s): Electronic Signature(s) Signed: 09/13/2022 5:15:08 PM By: Blanche East RN Signed: 09/14/2022 9:25:12 AM By: Fredirick Maudlin MD FACS Entered By: Blanche East on 09/13/2022 16:46:57 -------------------------------------------------------------------------------- Problem List Details Patient Name: Date of Service: [name], [name] [place] [name] 09/13/2022 3:15 PM Medical Record Number: [id number] Patient Account Number: [id number] Date of Birth/Sex:  Treating RN: 1951-11-18 (71 y.o. Marta Lamas Primary Care Provider: Jim Like Other Clinician: Referring Provider: Treating Provider/Extender: Rory Percy in Treatment: 23 Active Problems ICD-10 Encounter Code Description Active Date MDM Diagnosis I89.0 Lymphedema, not elsewhere classified 04/03/2022 No Yes F02.77 Chronic diastolic (congestive) heart failure 04/03/2022 No Yes E10.59 Type 1 diabetes mellitus with other circulatory complications 04/05/8785  No Yes E10.42 Type 1 diabetes mellitus with diabetic polyneuropathy 04/03/2022 No Yes Z68.41 Body mass index [BMI]40.0-44.9, adult 04/03/2022 No Yes E10.622 Type 1 diabetes mellitus with other skin ulcer 04/03/2022 No Yes L97.522 Non-pressure chronic ulcer of other part of left foot with fat layer exposed 07/12/2022 No Yes Inactive Problems ICD-10 Code Description Active Date Inactive Date L97.212 Non-pressure chronic ulcer of right calf with fat layer exposed 04/03/2022 04/03/2022 I71.245 Non-pressure chronic ulcer of left calf with fat layer exposed 04/03/2022 04/03/2022 L97.312 Non-pressure chronic ulcer of right ankle with fat layer exposed 04/03/2022 04/03/2022 L97.821 Non-pressure chronic ulcer of other part of left lower leg limited to breakdown of skin 05/15/2022 05/15/2022 L97.422 Non-pressure chronic ulcer of left heel and midfoot with fat layer exposed 06/30/2022 06/30/2022 L97.811 Non-pressure chronic ulcer of other part of right lower leg limited to breakdown of skin 05/23/2022 05/23/2022 Resolved Problems Electronic Signature(s) Signed: 09/13/2022 4:00:26 PM By: Fredirick Maudlin MD FACS Previous Signature: 09/13/2022 3:47:31 PM Version By: Fredirick Maudlin MD FACS Entered By: Fredirick Maudlin on 09/13/2022 16:00:26 -------------------------------------------------------------------------------- Progress Note Details Patient Name: Date of Service: [name], [name] 09/13/2022 3:15 PM Medical Record  Number: [id number] Patient Account Number: [id number] Date of Birth/Sex: Treating RN: 1951-08-05 (71 y.o. F) Primary Care Provider: Jim Like Other Clinician: Referring Provider: Treating Provider/Extender: Rory Percy in Treatment: 23 Subjective Chief Complaint Information obtained from Patient Patient presents for treatment of open ulcers due to venous insufficiency in the setting of poorly controlled DM1 and CHF History of Present Illness (HPI) ADMISSION 04/03/2022 This is a 71 year old woman with a past medical history notable for type 1 diabetes mellitus, poorly controlled with her most recent A1c being 8.3, congestive heart failure with recent admission for exacerbation secondary to noncompliance with diuretics, as well as history of pontine stroke and oxygen dependence. She has wounds on her bilateral lower extremities. She says they have been present for about 2 months and initially started as blisters. She says that she has compression stockings, but does not wear them because they are too difficult to get on. ABIs done in clinic were 1.4 and 1.5, suggesting some degree of noncompressibility. She also has a wound on her right heel that she says is secondary to it getting bumped frequently as she transfers. She came to clinic with just dry dressings on her legs. She does have home health care, as well as her sister that have been assisting her with her dressings. She has multiple wounds. On the left medial calf, just above her ankle, there is a somewhat geographic wound with slough and some epithelialization. On the anterior tibial surface and lateral calf, the wounds are quite desiccated with yellow eschar present. On the right anterior tibial surface, the wounds are gritty with thin slough and eschar. The posterior right ankle wound is just above the calcaneus overlying the Achilles and has thick yellow-gray slough and a bit of odor. No purulent drainage  appreciated. 04/10/2022: The wounds on her left leg are closed. On the right, the anterior tibial surface wounds are epithelializing and much cleaner. There is just a layer of biofilm on the open areas. On the posterior right ankle wound, there is a decreased volume of slough and no odor. We have been using Hydrofera Blue to all sites under compression. 04/17/2022: The wounds on her left leg remained closed and she did bring her juxta lite stockings with her today. On the right, the more distal wound is closed and the  proximal wound has epithelialized significantly since her last visit. At the posterior right ankle, she still has accumulated some slough and some periwound callus but there is no odor or significant drainage. 04/24/2022: The right anterior leg wounds are closed. She still has a wound on her posterior right ankle that looks about the same as last week. She did not bring her right sided juxta light with her today but is wearing the left sided stocking. 05/01/2022: She has a tiny abrasion on her left anterior tibia which we did not add to her wound count today. The wound on her posterior right ankle is a little bit smaller with a little accumulated slough. No drainage or concern for infection. 05/08/2022: She continues to forget to bring her juxta lite stocking with her so she remains in a compression wrap despite healing of the right anterior leg wounds. The wound on her posterior right ankle/calcaneus is a bit smaller today with just a little bit of accumulated slough. She is wearing a juxta lite stocking on the left leg. 05/15/2022: She says that she has been wearing her juxta lite stockings at home, but today she is wearing regular compression stockings that she purchased from [employer]; the juxta lite stockings are in the laundry. The wound on her posterior right ankle/calcaneus continues to contract and has just a little bit of accumulated slough. Unfortunately, her edema control is suboptimal  on the left and she has developed several small blisters with a larger 1 at the medial calf. 05/23/2022: The patient has been wearing her juxta lite stockings. Unfortunately, she has developed a large blister on her right lower leg just above the ankle. The skin overlying it is still intact. The blister that had opened on her left medial calf is smaller; and the other small blistered sites are closed. The posterior right ankle/calcaneus is about the same today. 06/07/2022: The only remaining open wound is on her posterior right calcaneus. The majority of this is covered with eschar. 06/30/2022: I am not sure why the patient did not return to clinic until today, but the ulcer on her right heel has healed. Unfortunately, she has developed a new ulcer on her left lateral foot. From the position of it, it looks as though the outward rotation of her lower leg at rest is probably causing the lateral midfoot to rub or press against the metal leg rests of her wheelchair. She is not padding the chair when she rests her leg on the leg rests. The new ulcer is fairly superficial with just the fat layer exposed. It is quite tender. No erythema, induration, or purulent drainage identified. 07/12/2022: After our last visit, she was seen by podiatry. Nail care was performed. For some reason he had her apply silicone toe covers on her bilateral great toes. As result, the skin is now very macerated and she has a new ulcer on the knuckle of her right great toe. It is fairly small but does expose the fat layer. No erythema induration or purulent drainage although there was some odor on her feet when the caps were removed. The wound on her left lateral foot is smaller with just a small amount of slough buildup. Her Prevalon boots arrived and we will put her in those after her dressing is changed. 08/02/2022: She has been wearing her Prevalon boots to some extent, but says that she spends most of her day in her wheelchair and the  boots slide off the foot rest. She apparently has not  been utilizing the leg strap. The left lateral foot wound is tender and the tissue has some discoloration suggestive of ongoing pressure in this area. There is a little bit of callus accumulation circumferentially. The wound on her right great toe DIP joint is nearly closed and just has a light layer of eschar overlying the surface. No concern for infection. 08/16/2022: The wound on her right great toe has healed. The wound on her left lateral foot is quite a bit smaller with good perimeter epithelialization and a light layer of slough on the surface. 08/30/2022: Today she is complaining of a lot more pain on her left lateral foot. I think we are struggling with adequate padding and prevention of pressure in the site. The wound opening itself is quite small, but there is circumferential undermining from old skin. There is no odor or purulent drainage. 09/13/2022: The lateral foot wound looks significantly improved today. It is smaller and I no longer detect the undermining. Patient History Information obtained from Patient. Family History Heart Disease - Mother, Hypertension - Mother,Father, Stroke - Child. Social History Never smoker, Marital Status - Divorced, Alcohol Use - Never, Drug Use - No History, Caffeine Use - Never. Medical History Eyes Patient has history of Cataracts Hematologic/Lymphatic Patient has history of Anemia Respiratory Patient has history of Sleep Apnea Cardiovascular Patient has history of Congestive Heart Failure, Hypertension Endocrine Patient has history of Type I Diabetes Oncologic Patient has history of Received Radiation Hospitalization/Surgery History - pacemaker implant. - loop recorder insertion and removal. - esophagogastroduodenoscopy. - TEE without cardioversion. - colonoscopy. - left and right heart catheterization. - abdominal angiogram. - thyroidectomy. - abdominal hysterectomy. - ankle fracture  surgery. Medical A Surgical History Notes nd Ear/Nose/Mouth/Throat edentulous, GERD, seasonal allergies, trouble swallowing Hematologic/Lymphatic hyperlipidemia Cardiovascular complete heart block, bradycardia, paroxysmal atrial fibrillation, pacemaker Gastrointestinal hemorrhoids Endocrine hypothyroidism Genitourinary proteinuria, frequent UTIs Musculoskeletal decreased ROM L shoulder, hemiparesis affecting L side, arthritis Neurologic benign paroxysmal positional vertigo, intracranial vascular stenosis, lacunar infarction, stroke Oncologic thyroid cancer Psychiatric depression Objective Constitutional Hypertensive, asymptomatic. No acute distress.. Vitals Time Taken: 3:19 PM, Height: 67 in, Weight: 243 lbs, BMI: 38.1, Temperature: 97.9 F, Pulse: 65 bpm, Respiratory Rate: 18 breaths/min, Blood Pressure: 164/79 mmHg, Pulse Oximetry: 95 %. Respiratory Normal work of breathing on room air.. General Notes: 09/13/2022: The lateral foot wound looks significantly improved today. It is smaller and I no longer detect the undermining. Integumentary (Hair, Skin) Wound #11 status is Open. Original cause of wound was Pressure Injury. The date acquired was: 06/16/2022. The wound has been in treatment 10 weeks. The wound is located on the Left Foot. The wound measures 0.5cm length x 0.4cm width x 0.1cm depth; 0.157cm^2 area and 0.016cm^3 volume. There is Fat Layer (Subcutaneous Tissue) exposed. There is no tunneling or undermining noted. There is a medium amount of serosanguineous drainage noted. The wound margin is distinct with the outline attached to the wound base. There is medium (34-66%) red granulation within the wound bed. There is a medium (34-66%) amount of necrotic tissue within the wound bed including Adherent Slough. 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 There was a Selective/Open Wound Skin/Epidermis Debridement with a total area of 0.2 sq cm performed by Fredirick Maudlin, MD. With the following instrument(s): Curette to remove Viable tissue/material. Material removed includes Callus and Skin: Epidermis and after achieving pain control using Lidocaine 5% topical ointment. No specimens were taken. A time out was conducted at 15:28, prior to the start of the procedure. There was no bleeding. The procedure was tolerated well with a pain level of 0 throughout and a pain level of 0 following the procedure. Post Debridement Measurements: 0.5cm length x 0.4cm width x 0.1cm depth; 0.016cm^3 volume. Character of Wound/Ulcer Post Debridement is improved. Severity of Tissue Post Debridement is: Fat layer exposed. Post procedure Diagnosis Wound #11: Same as Pre-Procedure General Notes: Scribed for Dr. Celine Ahr by Blanche East, RN. Plan Follow-up Appointments: Return Appointment in 2 weeks. - Dr. Celine Ahr - room 4 New prescription for Gentamicin sent to pharmacy. Bathing/ Shower/ Hygiene: May shower and wash wound with soap and water. - with dressing changes Edema Control - Lymphedema / SCD / Other: Elevate legs to the level of the heart or above for 30 minutes daily and/or when sitting, a frequency of: - throughout the day Patient to wear own compression stockings every day. - juxtalites to both legs Exercise regularly Off-Loading: Other: - Prevalon boot to both feet. Please put pillow between foot and foot rest so the foot is not  resting on the metal footrest. Home Health: Admit to [facility] for wound care. May utilize formulary equivalent dressing for wound treatment orders unless otherwise specified. New wound care orders this week; continue Home Health for wound care. May utilize formulary equivalent dressing for wound treatment orders unless otherwise specified. - No need for gentamycin at this time Other Home Health Orders/Instructions: [facility] WOUND #11: - Foot Wound Laterality: Left Cleanser: Soap and Water Every Other Day/15 Days Discharge Instructions: May shower and wash wound with dial antibacterial soap and water prior to dressing change. Cleanser: Wound Cleanser Every Other Day/15 Days Discharge Instructions: Cleanse the wound with wound cleanser prior to applying a clean dressing using gauze sponges, not tissue or cotton balls. Prim Dressing: KerraCel Ag Gelling Fiber Dressing, 2x2 in (silver alginate) (Generic) Every Other Day/15 Days ary Discharge Instructions: Apply silver alginate to wound bed as instructed Secondary Dressing: Optifoam Non-Adhesive Dressing, 4x4 in Every Other Day/15 Days Discharge Instructions: Apply over primary dressing as directed. Secondary Dressing: Woven Gauze Sponge, Non-Sterile 4x4 in Every Other Day/15 Days Discharge Instructions: Apply over primary dressing as directed. Secured With: [employer], 4.5x3.1 (in/yd) Every Other Day/15 Days Discharge Instructions: Secure with Kerlix as directed. Secured With: 8M Medipore Public affairs consultant Surgical T 2x10 (in/yd) Every Other Day/15 Days ape Discharge Instructions: Secure with tape as directed. 09/13/2022: The lateral foot wound looks significantly improved today. It is smaller and I no longer detect the undermining. I used a curette to debride periwound callus and a little bit of skin that was hanging from the side of the wound. I think we can discontinue the gentamicin and just use silver alginate. We will schedule her  for follow-up in 2 weeks, but she reports that her home health providers have contacted a podiatrist who comes to peoples homes and also performs wound care and the patient may elect to continue her care with this individual as transportation is a challenge for her. Electronic Signature(s) Signed: 09/13/2022 4:01:23 PM By: Fredirick Maudlin  MD FACS Previous Signature: 09/13/2022 3:58:57 PM Version By: Fredirick Maudlin MD FACS Previous Signature: 09/13/2022 3:51:34 PM Version By: Fredirick Maudlin MD FACS Entered By: Fredirick Maudlin on 09/13/2022 16:01:23 -------------------------------------------------------------------------------- HxROS Details Patient Name: Date of Service: [name], [name] 09/13/2022 3:15 PM Medical Record Number: [id number] Patient Account Number: [id number] Date of Birth/Sex: Treating RN: Mar 13, 1951 (71 y.o. F) Primary Care Provider: Jim Like Other Clinician: Referring Provider: Treating Provider/Extender: Rory Percy in Treatment: 23 Information Obtained From Patient Eyes Medical History: Positive for: Cataracts Ear/Nose/Mouth/Throat Medical History: Past Medical History Notes: edentulous, GERD, seasonal allergies, trouble swallowing Hematologic/Lymphatic Medical History: Positive for: Anemia Past Medical History Notes: hyperlipidemia Respiratory Medical History: Positive for: Sleep Apnea Cardiovascular Medical History: Positive for: Congestive Heart Failure; Hypertension Past Medical History Notes: complete heart block, bradycardia, paroxysmal atrial fibrillation, pacemaker Gastrointestinal Medical History: Past Medical History Notes: hemorrhoids Endocrine Medical History: Positive for: Type I Diabetes Past Medical History Notes: hypothyroidism Time with diabetes: 51 yrs Treated with: Insulin Blood sugar tested every day: Yes Tested : on pump Genitourinary Medical History: Past Medical History  Notes: proteinuria, frequent UTIs Musculoskeletal Medical History: Past Medical History Notes: decreased ROM L shoulder, hemiparesis affecting L side, arthritis Neurologic Medical History: Past Medical History Notes: benign paroxysmal positional vertigo, intracranial vascular stenosis, lacunar infarction, stroke Oncologic Medical History: Positive for: Received Radiation Past Medical History Notes: thyroid cancer Psychiatric Medical History: Past Medical History Notes: depression HBO Extended History Items Eyes: Cataracts Immunizations Pneumococcal Vaccine: Received Pneumococcal Vaccination: Yes Received Pneumococcal Vaccination On or After 60th Birthday: Yes Implantable Devices Yes Hospitalization / Surgery History Type of Hospitalization/Surgery pacemaker implant loop recorder insertion and removal esophagogastroduodenoscopy TEE without cardioversion colonoscopy left and right heart catheterization abdominal angiogram thyroidectomy abdominal hysterectomy ankle fracture surgery Family and Social History Heart Disease: Yes - Mother; Hypertension: Yes - Mother,Father; Stroke: Yes - Child; Never smoker; Marital Status - Divorced; Alcohol Use: Never; Drug Use: No History; Caffeine Use: Never; Financial Concerns: No; Food, Clothing or Shelter Needs: No; Support System Lacking: No; Transportation Concerns: No Electronic Signature(s) Signed: 09/13/2022 4:08:58 PM By: Fredirick Maudlin MD FACS Entered By: Fredirick Maudlin on 09/13/2022 15:48:33 -------------------------------------------------------------------------------- SuperBill Details Patient Name: Date of Service: [name], [name] 09/13/2022 Medical Record Number: [id number] Patient Account Number: [id number] Date of Birth/Sex: Treating RN: [birth year] (71 y.o. F) Primary Care Provider: Jim Like Other Clinician: Referring Provider: Treating Provider/Extender: Rory Percy in  Treatment: 23 Diagnosis Coding ICD-10 Codes Code Description I89.0 Lymphedema, not elsewhere classified H08.65 Chronic diastolic (congestive) heart failure E10.59 Type 1 diabetes mellitus with other circulatory complications H84.69 Type 1 diabetes mellitus with diabetic polyneuropathy Z68.41 Body mass index [BMI]40.0-44.9, adult E10.622 Type 1 diabetes mellitus with other skin ulcer L97.522 Non-pressure chronic ulcer of other part of left foot with fat layer exposed Facility Procedures CPT4 Code: [id number] Description: [phone] - DEBRIDE WOUND 1ST 20 SQ CM OR < ICD-10 Diagnosis Description L97.522 Non-pressure chronic ulcer of other part of left foot with fat layer exposed Modifier: Quantity: 1 Physician Procedures : CPT4 Code Description Modifier [id number] - WC PHYS DEBR WO ANESTH 20 SQ CM ICD-10 Diagnosis Description L97.522 Non-pressure chronic ulcer of other part of left foot with fat layer exposed Quantity: 1 Electronic Signature(s) Signed: 09/13/2022 4:01:32 PM By: Fredirick Maudlin MD FACS Entered By: Fredirick Maudlin on 09/13/2022 16:01:31

## 2022-09-17 ENCOUNTER — Other Ambulatory Visit: Payer: Self-pay | Admitting: Student

## 2022-09-17 ENCOUNTER — Other Ambulatory Visit: Payer: Self-pay | Admitting: Family Medicine

## 2022-09-17 DIAGNOSIS — E890 Postprocedural hypothyroidism: Secondary | ICD-10-CM

## 2022-09-17 MED ORDER — LEVOTHYROXINE SODIUM 175 MCG PO TABS: 175.0000 ug | ORAL_TABLET | Freq: Every day | ORAL | 2 refills | Status: DC

## 2022-09-17 NOTE — Progress Notes (Signed)
Received After hours call regarding synthroid, sent refill for 90 day supply for patient to [facility].    Erie Sica Data processing manager

## 2022-09-21 DIAGNOSIS — Z23 Encounter for immunization: Secondary | ICD-10-CM | POA: Diagnosis not present

## 2022-09-21 DIAGNOSIS — E139 Other specified diabetes mellitus without complications: Secondary | ICD-10-CM | POA: Diagnosis not present

## 2022-09-21 DIAGNOSIS — I639 Cerebral infarction, unspecified: Secondary | ICD-10-CM | POA: Diagnosis not present

## 2022-09-21 DIAGNOSIS — I509 Heart failure, unspecified: Secondary | ICD-10-CM | POA: Diagnosis not present

## 2022-09-21 DIAGNOSIS — I10 Essential (primary) hypertension: Secondary | ICD-10-CM | POA: Diagnosis not present

## 2022-09-21 DIAGNOSIS — I480 Paroxysmal atrial fibrillation: Secondary | ICD-10-CM | POA: Diagnosis not present

## 2022-09-21 DIAGNOSIS — E039 Hypothyroidism, unspecified: Secondary | ICD-10-CM | POA: Diagnosis not present

## 2022-09-21 DIAGNOSIS — E785 Hyperlipidemia, unspecified: Secondary | ICD-10-CM | POA: Diagnosis not present

## 2022-09-21 DIAGNOSIS — Z950 Presence of cardiac pacemaker: Secondary | ICD-10-CM | POA: Diagnosis not present

## 2022-09-21 DIAGNOSIS — D649 Anemia, unspecified: Secondary | ICD-10-CM | POA: Diagnosis not present

## 2022-09-26 ENCOUNTER — Telehealth: Payer: Self-pay

## 2022-09-26 NOTE — Telephone Encounter (Signed)
Elle with patients insurance calls nurse line to file a complaint.  Elle reports the patient filed a complaint against our office. Elle reports she can not disclose the details of complaint. She reports she can "only speak to the office manager."   Elle reports she is faxing over the details of complaint to office manager.   Elle reports once this is reiceved she will need a call back to close the case.   Will forward to Janett Billow to make aware.

## 2022-09-27 ENCOUNTER — Encounter (HOSPITAL_BASED_OUTPATIENT_CLINIC_OR_DEPARTMENT_OTHER): Payer: Medicare PPO | Attending: General Surgery | Admitting: General Surgery

## 2022-09-27 DIAGNOSIS — E11621 Type 2 diabetes mellitus with foot ulcer: Secondary | ICD-10-CM | POA: Diagnosis not present

## 2022-09-27 DIAGNOSIS — L97522 Non-pressure chronic ulcer of other part of left foot with fat layer exposed: Secondary | ICD-10-CM | POA: Insufficient documentation

## 2022-09-27 DIAGNOSIS — I5032 Chronic diastolic (congestive) heart failure: Secondary | ICD-10-CM | POA: Diagnosis not present

## 2022-09-27 DIAGNOSIS — I890 Lymphedema, not elsewhere classified: Secondary | ICD-10-CM | POA: Diagnosis not present

## 2022-09-27 DIAGNOSIS — E10621 Type 1 diabetes mellitus with foot ulcer: Secondary | ICD-10-CM | POA: Diagnosis not present

## 2022-09-27 DIAGNOSIS — E1042 Type 1 diabetes mellitus with diabetic polyneuropathy: Secondary | ICD-10-CM | POA: Diagnosis not present

## 2022-09-27 DIAGNOSIS — I110 Hypertensive heart disease with heart failure: Secondary | ICD-10-CM | POA: Diagnosis not present

## 2022-09-27 NOTE — Progress Notes (Signed)
[name], [name] ([id number]) Visit Report for 09/27/2022 Arrival Information Details Patient Name: Date of Service: [name]. 09/27/2022 3:15 PM Medical Record Number: [id number] Patient Account Number: [id number] Date of Birth/Sex: Treating RN: 1951/05/08 (71 y.o. Iver Nestle, [place] Primary Care Loralye Loberg: Jim Like Other Clinician: Referring Dimitriy Carreras: Treating Shriyan Arakawa/Extender: Rory Percy in Treatment: 25 Visit Information History Since Last Visit All ordered tests and consults were completed: Yes Patient Arrived: Wheel Chair Added or deleted any medications: No Arrival Time: 15:48 Any new allergies or adverse reactions: No Accompanied By: self Had a fall or experienced change in No Transfer Assistance: None activities of daily living that may affect Patient Requires Transmission-Based Precautions: No risk of falls: Patient Has Alerts: Yes Signs or symptoms of abuse/neglect since last visito No Patient Alerts: Patient on Blood Thinner Hospitalized since last visit: No pacemaker Implantable device outside of the clinic excluding No cellular tissue based products placed in the center since last visit: Has Dressing in Place as Prescribed: Yes Has Compression in Place as Prescribed: Yes Pain Present Now: No Notes pt stays in the wheelchair Electronic Signature(s) Signed: 09/27/2022 5:03:36 PM By: Blanche East RN Entered By: Blanche East on 09/27/2022 15:48:58 -------------------------------------------------------------------------------- Encounter Discharge Information Details Patient Name: Date of Service: [name], [name] 09/27/2022 3:15 PM Medical Record Number: [id number] Patient Account Number: [id number] Date of Birth/Sex: Treating RN: [birth year] (71 y.o. Marta Lamas Primary Care Matvey Llanas: Jim Like Other Clinician: Referring Kamdyn Colborn: Treating Mamoru Takeshita/Extender: Rory Percy in  Treatment: 25 Encounter Discharge Information Items Post Procedure Vitals Discharge Condition: Stable Temperature (F): 97.8 Ambulatory Status: Wheelchair Pulse (bpm): 54 Discharge Destination: Home Respiratory Rate (breaths/min): 18 Transportation: Private Auto Blood Pressure (mmHg): 138/55 Accompanied By: self Schedule Follow-up Appointment: Yes Clinical Summary of Care: Electronic Signature(s) Signed: 09/27/2022 5:03:36 PM By: Blanche East RN Entered By: Blanche East on 09/27/2022 16:50:20 -------------------------------------------------------------------------------- Lower Extremity Assessment Details Patient Name: Date of Service: [name] [name] 09/27/2022 3:15 PM Medical Record Number: [id number] Patient Account Number: [id number] Date of Birth/Sex: Treating RN: [birth year] (71 y.o. Marta Lamas Primary Care Brailen Macneal: Jim Like Other Clinician: Referring Birl Lobello: Treating Djeneba Barsch/Extender: Rory Percy in Treatment: 25 Edema Assessment Assessed: Shirlyn Goltz: No] [Right: No] E[Left: dema] [Right: :] Calf Left: Right: Point of Measurement: From Medial Instep 40.5 cm Ankle Left: Right: Point of Measurement: From Medial Instep 26 cm Vascular Assessment Pulses: Dorsalis Pedis Palpable: [Left:Yes] Electronic Signature(s) Signed: 09/27/2022 5:03:36 PM By: Blanche East RN Entered By: Blanche East on 09/27/2022 15:49:54 -------------------------------------------------------------------------------- Multi Wound Chart Details Patient Name: Date of Service: [name], [name] 09/27/2022 3:15 PM Medical Record Number: [id number] Patient Account Number: [id number] Date of Birth/Sex: Treating RN: 1951-08-19 (71 y.o. F) Primary Care Jamel Holzmann: Jim Like Other Clinician: Referring Kamiah Fite: Treating Nimah Uphoff/Extender: Rory Percy in Treatment: 25 Vital Signs Height(in): 69 Capillary Blood  Glucose(mg/dl): 147 Weight(lbs): 243 Pulse(bpm): 62 Body Mass Index(BMI): 38.1 Blood Pressure(mmHg): 138/55 Temperature(F): 97.8 Respiratory Rate(breaths/min): 18 Photos: [11:Left Foot] [13:Left T Great oe] [N/A:N/A N/A] Wound Location: [11:Pressure Injury] [13:Footwear Injury] [N/A:N/A] Wounding Event: [11:Diabetic Wound/Ulcer of the Lower] [13:Diabetic Wound/Ulcer of the Lower] [N/A:N/A] Primary Etiology: [11:Extremity Cataracts, Anemia, Sleep Apnea,] [13:Extremity Cataracts, Anemia, Sleep Apnea,] [N/A:N/A] Comorbid History: [11:Congestive Heart Failure, Hypertension, Type I Diabetes, Received Radiation 06/16/2022] [13:Congestive Heart Failure, Hypertension, Type I Diabetes, Received Radiation 09/06/2022] [N/A:N/A] Date Acquired: [11:12] [13:0] [N/A:N/A] Weeks of Treatment: [11:Healed - Epithelialized] [13:Open] [N/A:N/A] Wound  Status: [11:No] [13:No] [N/A:N/A] Wound Recurrence: [11:0x0x0] [13:1x0.8x0.1] [N/A:N/A] Measurements L x W x D (cm) [11:0] [13:0.628] [N/A:N/A] A (cm) : rea [11:0] [13:0.063] [N/A:N/A] Volume (cm) : [11:99.50%] [13:N/A] [N/A:N/A] % Reduction in A [11:rea: 99.40%] [13:N/A] [N/A:N/A] % Reduction in Volume: [11:Grade 2] [13:Grade 1] [N/A:N/A] Classification: [11:None Present] [13:Medium] [N/A:N/A] Exudate A mount: [11:N/A] [13:Serosanguineous] [N/A:N/A] Exudate Type: [11:N/A] [13:red, brown] [N/A:N/A] Exudate Color: [11:Distinct, outline attached] [13:N/A] [N/A:N/A] Wound Margin: [11:Large (67-100%)] [13:Medium (34-66%)] [N/A:N/A] Granulation A mount: [11:N/A] [13:Red] [N/A:N/A] Granulation Quality: [11:None Present (0%)] [13:Medium (34-66%)] [N/A:N/A] Necrotic A mount: [11:N/A] [13:Eschar, Adherent Slough] [N/A:N/A] Necrotic Tissue: [11:Fat Layer (Subcutaneous Tissue): Yes Fat Layer (Subcutaneous Tissue): Yes N/A] Exposed Structures: [11:Fascia: No Tendon: No Muscle: No Joint: No Bone: No None] [13:Fascia: No Tendon: No Muscle: No Joint: No Bone: No N/A]  [N/A:N/A] Epithelialization: [11:N/A] [13:Debridement - Selective/Open Wound N/A] Debridement: Pre-procedure Verification/Time Out N/A [13:16:01] [N/A:N/A] Taken: [11:N/A] [13:Lidocaine 5% topical ointment] [N/A:N/A] Pain Control: [11:N/A] [13:Slough] [N/A:N/A] Tissue Debrided: [11:N/A] [13:Non-Viable Tissue] [N/A:N/A] Level: [11:N/A] [13:0.8] [N/A:N/A] Debridement A (sq cm): [11:rea N/A] [13:Curette] [N/A:N/A] Instrument: [11:N/A] [13:Minimum] [N/A:N/A] Bleeding: [11:N/A] [13:Pressure] [N/A:N/A] Hemostasis A chieved: [11:N/A] [13:0] [N/A:N/A] Procedural Pain: [11:N/A] [13:0] [N/A:N/A] Post Procedural Pain: [11:N/A] [13:Procedure was tolerated well] [N/A:N/A] Debridement Treatment Response: [11:N/A] [13:1x0.8x0.1] [N/A:N/A] Post Debridement Measurements L x W x D (cm) [11:N/A] [13:0.063] [N/A:N/A] Post Debridement Volume: (cm) [11:Dry/Scaly: Yes] [13:Maceration: Yes] [N/A:N/A] Periwound Skin Moisture: [11:N/A] [13:Dry/Scaly: Yes Debridement] [N/A:N/A] Treatment Notes Electronic Signature(s) Signed: 09/27/2022 4:45:20 PM By: Fredirick Maudlin MD FACS Entered By: Fredirick Maudlin on 09/27/2022 16:45:20 -------------------------------------------------------------------------------- Multi-Disciplinary Care Plan Details Patient Name: Date of Service: [name], [name] [place] [name] 09/27/2022 3:15 PM Medical Record Number: [id number] Patient Account Number: [id number] Date of Birth/Sex: Treating RN: 17-May-1951 (71 y.o. Iver Nestle, [place] Primary Care Shaman Muscarella: Jim Like Other Clinician: Referring Aunica Dauphinee: Treating Broxton Broady/Extender: Rory Percy in Treatment: 25 Multidisciplinary Care Plan reviewed with physician Active Inactive Abuse / Safety / Falls / Self Care Management Nursing Diagnoses: History of Falls Impaired physical mobility Goals: Patient/caregiver will verbalize/demonstrate measures taken to prevent injury and/or falls Date Initiated:  04/03/2022 Target Resolution Date: 10/20/2022 Goal Status: Active Interventions: Assess fall risk on admission and as needed Assess: immobility, friction, shearing, incontinence upon admission and as needed Assess impairment of mobility on admission and as needed per policy Notes: Nutrition Nursing Diagnoses: Impaired glucose control: actual or potential Potential for alteratiion in Nutrition/Potential for imbalanced nutrition Goals: Patient/caregiver will maintain therapeutic glucose control Date Initiated: 04/03/2022 Target Resolution Date: 10/20/2022 Goal Status: Active Interventions: Assess HgA1c results as ordered upon admission and as needed Provide education on elevated blood sugars and impact on wound healing Treatment Activities: Patient referred to Primary Care Physician for further nutritional evaluation : 04/03/2022 Notes: Venous Leg Ulcer Nursing Diagnoses: Knowledge deficit related to disease process and management Potential for venous Insuffiency (use before diagnosis confirmed) Goals: Patient will maintain optimal edema control Date Initiated: 04/03/2022 Target Resolution Date: 10/20/2022 Goal Status: Active Interventions: Assess peripheral edema status every visit. Compression as ordered Provide education on venous insufficiency Treatment Activities: Therapeutic compression applied : 04/03/2022 Notes: Electronic Signature(s) Signed: 09/27/2022 5:03:36 PM By: Blanche East RN Entered By: Blanche East on 09/27/2022 16:48:41 -------------------------------------------------------------------------------- Pain Assessment Details Patient Name: Date of Service: [name], [name] 09/27/2022 3:15 PM Medical Record Number: [id number] Patient Account Number: [id number] Date of Birth/Sex: Treating RN: 1951/04/04 (71 y.o. Marta Lamas Primary Care Nova Schmuhl: Jim Like Other Clinician: Referring Aymar Whitfill: Treating Carah Barrientes/Extender: Celine Ahr  [place] Siren, Jeneen Rinks Weeks in Treatment: 25 Active Problems Location of Pain Severity and Description of Pain Patient Has Paino No Site Locations Pain Management and Medication Current Pain Management: Electronic Signature(s) Signed: 09/27/2022 5:03:36 PM By: Blanche East RN Entered By: Blanche East on 09/27/2022 15:49:32 -------------------------------------------------------------------------------- Patient/Caregiver Education Details Patient Name: Date of Service: Jacqulyn Ducking 10/4/2023andnbsp3:15 PM Medical Record Number: [id number] Patient Account Number: [id number] Date of Birth/Gender: Treating RN: 1951-04-23 (71 y.o. Marta Lamas Primary Care Physician: Jim Like Other Clinician: Referring Physician: Treating Physician/Extender: Rory Percy in Treatment: 25 Education Assessment Education Provided To: Patient Education Topics Provided Elevated Blood Sugar/ Impact on Healing: Methods: Explain/Verbal Responses: Reinforcements needed, State content correctly Venous: Methods: Explain/Verbal Responses: Reinforcements needed Electronic Signature(s) Signed: 09/27/2022 5:03:36 PM By: Blanche East RN Entered By: Blanche East on 09/27/2022 16:49:09 -------------------------------------------------------------------------------- Wound Assessment Details Patient Name: Date of Service: [name], [name] 09/27/2022 3:15 PM Medical Record Number: [id number] Patient Account Number: [id number] Date of Birth/Sex: Treating RN: 11-04-1951 (71 y.o. Iver Nestle, Jamie Primary Care Shalisa Mcquade: Jim Like Other Clinician: Referring Takeysha Bonk: Treating Clova Morlock/Extender: Rory Percy in Treatment: 25 Wound Status Wound Number: 11 Primary Diabetic Wound/Ulcer of the Lower Extremity Etiology: Wound Location: Left Foot Wound Healed - Epithelialized Wounding Event: Pressure Injury Status: Date Acquired:  06/16/2022 Comorbid Cataracts, Anemia, Sleep Apnea, Congestive Heart Failure, Weeks Of Treatment: 12 History: Hypertension, Type I Diabetes, Received Radiation Clustered Wound: No Photos Wound Measurements Length: (cm) Width: (cm) Depth: (cm) Area: (cm) Volume: (cm) 0 % Reduction in Area: 99.5% 0 % Reduction in Volume: 99.4% 0 Epithelialization: None 0 Tunneling: No 0 Undermining: No Wound Description Classification: Grade 2 Wound Margin: Distinct, outline attached Exudate Amount: None Present Foul Odor After Cleansing: No Slough/Fibrino Yes Wound Bed Granulation Amount: Large (67-100%) Exposed Structure Necrotic Amount: None Present (0%) Fascia Exposed: No Fat Layer (Subcutaneous Tissue) Exposed: Yes Tendon Exposed: No Muscle Exposed: No Joint Exposed: No Bone Exposed: No Periwound Skin Texture Texture Color No Abnormalities Noted: No No Abnormalities Noted: No Moisture No Abnormalities Noted: No Dry / Scaly: Yes Treatment Notes Wound #11 (Foot) Wound Laterality: Left Cleanser Peri-Wound Care Topical Primary Dressing Secondary Dressing Secured With Compression Wrap Compression Stockings Add-Ons Electronic Signature(s) Signed: 09/27/2022 5:03:36 PM By: Blanche East RN Entered By: Blanche East on 09/27/2022 16:05:48 -------------------------------------------------------------------------------- Wound Assessment Details Patient Name: Date of Service: [name], [name] 09/27/2022 3:15 PM Medical Record Number: [id number] Patient Account Number: [id number] Date of Birth/Sex: Treating RN: Oct 08, 1951 (71 y.o. Marta Lamas Primary Care Mekhi Lascola: Jim Like Other Clinician: Referring Chanoch Mccleery: Treating Tawnya Pujol/Extender: Rory Percy in Treatment: 25 Wound Status Wound Number: 13 Primary Diabetic Wound/Ulcer of the Lower Extremity Etiology: Wound Location: Left T Great oe Wound Open Wounding Event: Footwear  Injury Status: Date Acquired: 09/06/2022 Comorbid Cataracts, Anemia, Sleep Apnea, Congestive Heart Failure, Weeks Of Treatment: 0 History: Hypertension, Type I Diabetes, Received Radiation Clustered Wound: No Photos Wound Measurements Length: (cm) 1 Width: (cm) 0.8 Depth: (cm) 0.1 Area: (cm) 0.628 Volume: (cm) 0.063 % Reduction in Area: % Reduction in Volume: Tunneling: No Undermining: No Wound Description Classification: Grade 1 Exudate Amount: Medium Exudate Type: Serosanguineous Exudate Color: red, brown Foul Odor After Cleansing: No Slough/Fibrino Yes Wound Bed Granulation Amount: Medium (34-66%) Exposed Structure Granulation Quality: Red Fascia Exposed: No Necrotic Amount: Medium (34-66%) Fat Layer (Subcutaneous Tissue) Exposed: Yes Necrotic Quality: Eschar, Adherent Slough Tendon Exposed: No Muscle Exposed: No Joint  Exposed: No Bone Exposed: No Periwound Skin Texture Texture Color No Abnormalities Noted: No No Abnormalities Noted: No Moisture No Abnormalities Noted: No Dry / Scaly: Yes Maceration: Yes Treatment Notes Wound #13 (Toe Great) Wound Laterality: Left Cleanser Soap and Water Discharge Instruction: May shower and wash wound with dial antibacterial soap and water prior to dressing change. Peri-Wound Care Sween Lotion (Moisturizing lotion) Discharge Instruction: Apply moisturizing lotion as directed Topical Primary Dressing KerraCel Ag Gelling Fiber Dressing, 2x2 in (silver alginate) Discharge Instruction: Apply silver alginate to wound bed as instructed Secondary Dressing ALLEVYN Gentle Border, 4x4 (in/in) Discharge Instruction: Apply over primary dressing as directed. Secured With [employer], 4.5x3.1 (in/yd) Discharge Instruction: Secure with Kerlix as directed. Transpore Surgical Tape, 2x10 (in/yd) Discharge Instruction: Secure dressing with tape as directed. Compression Wrap Compression Stockings Add-Ons Electronic  Signature(s) Signed: 09/27/2022 5:03:36 PM By: Blanche East RN Entered By: Blanche East on 09/27/2022 15:57:43 -------------------------------------------------------------------------------- Vitals Details Patient Name: Date of Service: [name], [name] 09/27/2022 3:15 PM Medical Record Number: [id number] Patient Account Number: [id number] Date of Birth/Sex: Treating RN: September 01, 1951 (71 y.o. Iver Nestle, Jamie Primary Care Hosanna Betley: Jim Like Other Clinician: Referring Ligia Duguay: Treating Windi Toro/Extender: Rory Percy in Treatment: 25 Vital Signs Time Taken: 15:49 Temperature (F): 97.8 Height (in): 67 Pulse (bpm): 54 Weight (lbs): 243 Respiratory Rate (breaths/min): 18 Body Mass Index (BMI): 38.1 Blood Pressure (mmHg): 138/55 Capillary Blood Glucose (mg/dl): 147 Reference Range: 80 - 120 mg / dl Electronic Signature(s) Signed: 09/27/2022 5:03:36 PM By: Blanche East RN Entered By: Blanche East on 09/27/2022 15:49:27

## 2022-09-27 NOTE — Progress Notes (Signed)
Erion, [name] ([id number]) Visit Report for 09/27/2022 Chief Complaint Document Details Patient Name: Date of Service: [name], [name]. 09/27/2022 3:15 PM Medical Record Number: [id number] Patient Account Number: [id number] Date of Birth/Sex: Treating RN: 03-11-1951 (71 y.o. F) Primary Care Provider: Jim Like Other Clinician: Referring Provider: Treating Provider/Extender: Rory Percy in Treatment: 25 Information Obtained from: Patient Chief Complaint Patient presents for treatment of open ulcers due to venous insufficiency in the setting of poorly controlled DM1 and CHF Electronic Signature(s) Signed: 09/27/2022 4:45:27 PM By: Fredirick Maudlin MD FACS Entered By: Fredirick Maudlin on 09/27/2022 16:45:27 -------------------------------------------------------------------------------- Debridement Details Patient Name: Date of Service: [name], [name] 09/27/2022 3:15 PM Medical Record Number: [id number] Patient Account Number: [id number] Date of Birth/Sex: Treating RN: 08-Aug-1951 (71 y.o. [place], [place] Primary Care Provider: Jim Like Other Clinician: Referring Provider: Treating Provider/Extender: Rory Percy in Treatment: 25 Debridement Performed for Assessment: Wound #13 Left T Great oe Performed By: Physician Fredirick Maudlin, MD Debridement Type: Debridement Severity of Tissue Pre Debridement: Fat layer exposed Level of Consciousness (Pre-procedure): Awake and Alert Pre-procedure Verification/Time Out Yes - 16:01 Taken: Start Time: 16:02 Pain Control: Lidocaine 5% topical ointment T Area Debrided (L x W): otal 1 (cm) x 0.8 (cm) = 0.8 (cm) Tissue and other material debrided: Non-Viable, Slough, Slough Level: Non-Viable Tissue Debridement Description: Selective/Open Wound Instrument: Curette Bleeding: Minimum Hemostasis Achieved: Pressure Procedural Pain: 0 Post Procedural Pain: 0 Response  to Treatment: Procedure was tolerated well Level of Consciousness (Post- Awake and Alert procedure): Post Debridement Measurements of Total Wound Length: (cm) 1 Width: (cm) 0.8 Depth: (cm) 0.1 Volume: (cm) 0.063 Character of Wound/Ulcer Post Debridement: Requires Further Debridement Severity of Tissue Post Debridement: Fat layer exposed Post Procedure Diagnosis Same as Pre-procedure Notes Scribed for Dr. Celine Ahr by Blanche East, RN Electronic Signature(s) Signed: 09/27/2022 4:56:11 PM By: Fredirick Maudlin MD FACS Signed: 09/27/2022 5:03:36 PM By: Blanche East RN Entered By: Blanche East on 09/27/2022 16:04:56 -------------------------------------------------------------------------------- HPI Details Patient Name: Date of Service: [name], [name] 09/27/2022 3:15 PM Medical Record Number: [id number] Patient Account Number: [id number] Date of Birth/Sex: Treating RN: [birth year] (71 y.o. F) Primary Care Provider: Jim Like Other Clinician: Referring Provider: Treating Provider/Extender: Rory Percy in Treatment: 25 History of Present Illness HPI Description: ADMISSION 04/03/2022 This is a 71 year old woman with a past medical history notable for type 1 diabetes mellitus, poorly controlled with her most recent A1c being 8.3, congestive heart failure with recent admission for exacerbation secondary to noncompliance with diuretics, as well as history of pontine stroke and oxygen dependence. She has wounds on her bilateral lower extremities. She says they have been present for about 2 months and initially started as blisters. She says that she has compression stockings, but does not wear them because they are too difficult to get on. ABIs done in clinic were 1.4 and 1.5, suggesting some degree of noncompressibility. She also has a wound on her right heel that she says is secondary to it getting bumped frequently as she transfers. She came to clinic  with just dry dressings on her legs. She does have home health care, as well as her sister that have been assisting her with her dressings. She has multiple wounds. On the left medial calf, just above her ankle, there is a somewhat geographic wound with slough and some epithelialization. On the anterior tibial surface and lateral calf, the wounds are quite desiccated with  yellow eschar present. On the right anterior tibial surface, the wounds are gritty with thin slough and eschar. The posterior right ankle wound is just above the calcaneus overlying the Achilles and has thick yellow-gray slough and a bit of odor. No purulent drainage appreciated. 04/10/2022: The wounds on her left leg are closed. On the right, the anterior tibial surface wounds are epithelializing and much cleaner. There is just a layer of biofilm on the open areas. On the posterior right ankle wound, there is a decreased volume of slough and no odor. We have been using Hydrofera Blue to all sites under compression. 04/17/2022: The wounds on her left leg remained closed and she did bring her juxta lite stockings with her today. On the right, the more distal wound is closed and the proximal wound has epithelialized significantly since her last visit. At the posterior right ankle, she still has accumulated some slough and some periwound callus but there is no odor or significant drainage. 04/24/2022: The right anterior leg wounds are closed. She still has a wound on her posterior right ankle that looks about the same as last week. She did not bring her right sided juxta light with her today but is wearing the left sided stocking. 05/01/2022: She has a tiny abrasion on her left anterior tibia which we did not add to her wound count today. The wound on her posterior right ankle is a little bit smaller with a little accumulated slough. No drainage or concern for infection. 05/08/2022: She continues to forget to bring her juxta lite stocking  with her so she remains in a compression wrap despite healing of the right anterior leg wounds. The wound on her posterior right ankle/calcaneus is a bit smaller today with just a little bit of accumulated slough. She is wearing a juxta lite stocking on the left leg. 05/15/2022: She says that she has been wearing her juxta lite stockings at home, but today she is wearing regular compression stockings that she purchased from [employer]; the juxta lite stockings are in the laundry. The wound on her posterior right ankle/calcaneus continues to contract and has just a little bit of accumulated slough. Unfortunately, her edema control is suboptimal on the left and she has developed several small blisters with a larger 1 at the medial calf. 05/23/2022: The patient has been wearing her juxta lite stockings. Unfortunately, she has developed a large blister on her right lower leg just above the ankle. The skin overlying it is still intact. The blister that had opened on her left medial calf is smaller; and the other small blistered sites are closed. The posterior right ankle/calcaneus is about the same today. 06/07/2022: The only remaining open wound is on her posterior right calcaneus. The majority of this is covered with eschar. 06/30/2022: I am not sure why the patient did not return to clinic until today, but the ulcer on her right heel has healed. Unfortunately, she has developed a new ulcer on her left lateral foot. From the position of it, it looks as though the outward rotation of her lower leg at rest is probably causing the lateral midfoot to rub or press against the metal leg rests of her wheelchair. She is not padding the chair when she rests her leg on the leg rests. The new ulcer is fairly superficial with just the fat layer exposed. It is quite tender. No erythema, induration, or purulent drainage identified. 07/12/2022: After our last visit, she was seen by podiatry. Nail care was  performed. For some  reason he had her apply silicone toe covers on her bilateral great toes. As result, the skin is now very macerated and she has a new ulcer on the knuckle of her right great toe. It is fairly small but does expose the fat layer. No erythema induration or purulent drainage although there was some odor on her feet when the caps were removed. The wound on her left lateral foot is smaller with just a small amount of slough buildup. Her Prevalon boots arrived and we will put her in those after her dressing is changed. 08/02/2022: She has been wearing her Prevalon boots to some extent, but says that she spends most of her day in her wheelchair and the boots slide off the foot rest. She apparently has not been utilizing the leg strap. The left lateral foot wound is tender and the tissue has some discoloration suggestive of ongoing pressure in this area. There is a little bit of callus accumulation circumferentially. The wound on her right great toe DIP joint is nearly closed and just has a light layer of eschar overlying the surface. No concern for infection. 08/16/2022: The wound on her right great toe has healed. The wound on her left lateral foot is quite a bit smaller with good perimeter epithelialization and a light layer of slough on the surface. 08/30/2022: Today she is complaining of a lot more pain on her left lateral foot. I think we are struggling with adequate padding and prevention of pressure in the site. The wound opening itself is quite small, but there is circumferential undermining from old skin. There is no odor or purulent drainage. 09/13/2022: The lateral foot wound looks significantly improved today. It is smaller and I no longer detect the undermining. 09/27/2022: The lateral foot wound has healed. Unfortunately, she has developed a new wound that appears to be secondary to friction on her left great toe dorsal knuckle. There is a little bit of slough present but no concern for  infection. Electronic Signature(s) Signed: 09/27/2022 4:46:09 PM By: Fredirick Maudlin MD FACS Entered By: Fredirick Maudlin on 09/27/2022 16:46:09 -------------------------------------------------------------------------------- Physical Exam Details Patient Name: Date of Service: [name], [name] 09/27/2022 3:15 PM Medical Record Number: [id number] Patient Account Number: [id number] Date of Birth/Sex: Treating RN: [birth year] (71 y.o. F) Primary Care Provider: Jim Like Other Clinician: Referring Provider: Treating Provider/Extender: Rory Percy in Treatment: 25 Constitutional . Bradycardic, asymptomatic.. . . No acute distress.Marland Kitchen Respiratory Normal work of breathing on supplemental oxygen.. Notes 09/27/2022: The lateral foot wound has healed. Unfortunately, she has developed a new wound that appears to be secondary to friction on her left great toe dorsal knuckle. There is a little bit of slough present but no concern for infection. Electronic Signature(s) Signed: 09/27/2022 4:49:46 PM By: Fredirick Maudlin MD FACS Previous Signature: 09/27/2022 4:46:43 PM Version By: Fredirick Maudlin MD FACS Entered By: Fredirick Maudlin on 09/27/2022 16:49:46 -------------------------------------------------------------------------------- Physician Orders Details Patient Name: Date of Service: [name], [name] 09/27/2022 3:15 PM Medical Record Number: [id number] Patient Account Number: [id number] Date of Birth/Sex: Treating RN: Nov 20, 1951 (71 y.o. Marta Lamas Primary Care Provider: Jim Like Other Clinician: Referring Provider: Treating Provider/Extender: Rory Percy in Treatment: 25 Verbal / Phone Orders: No Diagnosis Coding ICD-10 Coding Code Description I89.0 Lymphedema, not elsewhere classified V78.46 Chronic diastolic (congestive) heart failure E10.59 Type 1 diabetes mellitus with other circulatory complications N62.95  Type 1 diabetes mellitus with diabetic polyneuropathy Z68.41  Body mass index [BMI]40.0-44.9, adult E10.622 Type 1 diabetes mellitus with other skin ulcer L97.522 Non-pressure chronic ulcer of other part of left foot with fat layer exposed Follow-up Appointments ppointment in 2 weeks. - Dr. Celine Ahr - room 3 Return A Anesthetic Wound #13 Left T Great oe (In clinic) Topical Lidocaine 5% applied to wound bed - in clinic, prior to debridement Bathing/ Shower/ Hygiene May shower and wash wound with soap and water. - with dressing changes Edema Control - Lymphedema / SCD / Other Bilateral Lower Extremities Elevate legs to the level of the heart or above for 30 minutes daily and/or when sitting, a frequency of: - throughout the day Patient to wear own compression stockings every day. - juxtalites to both legs Exercise regularly Off-Loading Other: - Prevalon boot to both feet. Please put pillow between foot and foot rest so the foot is not resting on the metal footrest. Home Health Admit to [facility] for wound care. May utilize formulary equivalent dressing for wound treatment orders unless otherwise specified. New wound care orders this week; continue Home Health for wound care. May utilize formulary equivalent dressing for wound treatment orders unless otherwise specified. - No need for gentamicin at this time Other Home Health Orders/Instructions: [facility] Wound Treatment Wound #13 - T Great oe Wound Laterality: Left Cleanser: Soap and Water Discharge Instructions: May shower and wash wound with dial antibacterial soap and water prior to dressing change. Peri-Wound Care: Sween Lotion (Moisturizing lotion) Discharge Instructions: Apply moisturizing lotion as directed Prim Dressing: KerraCel Ag Gelling Fiber Dressing, 2x2 in (silver alginate) ary Discharge Instructions: Apply silver alginate to wound bed as instructed Secondary Dressing: ALLEVYN Gentle Border, 4x4 (in/in) Discharge  Instructions: Apply over primary dressing as directed. Secured With: [employer], 4.5x3.1 (in/yd) Discharge Instructions: Secure with Kerlix as directed. Secured With: Transpore Surgical Tape, 2x10 (in/yd) Discharge Instructions: Secure dressing with tape as directed. Electronic Signature(s) Signed: 09/27/2022 4:56:11 PM By: Fredirick Maudlin MD FACS Signed: 09/27/2022 5:03:36 PM By: Blanche East RN Entered By: Blanche East on 09/27/2022 16:48:17 -------------------------------------------------------------------------------- Problem List Details Patient Name: Date of Service: [name], [name] [place] [name] 09/27/2022 3:15 PM Medical Record Number: [id number] Patient Account Number: [id number] Date of Birth/Sex: Treating RN: 08/04/1951 (71 y.o. F) Primary Care Provider: Jim Like Other Clinician: Referring Provider: Treating Provider/Extender: Rory Percy in Treatment: 25 Active Problems ICD-10 Encounter Code Description Active Date MDM Diagnosis I89.0 Lymphedema, not elsewhere classified 04/03/2022 No Yes X32.44 Chronic diastolic (congestive) heart failure 04/03/2022 No Yes E10.59 Type 1 diabetes mellitus with other circulatory complications 0/09/2724 No Yes E10.42 Type 1 diabetes mellitus with diabetic polyneuropathy 04/03/2022 No Yes Z68.41 Body mass index [BMI]40.0-44.9, adult 04/03/2022 No Yes E10.622 Type 1 diabetes mellitus with other skin ulcer 04/03/2022 No Yes L97.522 Non-pressure chronic ulcer of other part of left foot with fat layer exposed 07/12/2022 No Yes Inactive Problems ICD-10 Code Description Active Date Inactive Date L97.212 Non-pressure chronic ulcer of right calf with fat layer exposed 04/03/2022 04/03/2022 L97.222 Non-pressure chronic ulcer of left calf with fat layer exposed 04/03/2022 04/03/2022 L97.312 Non-pressure chronic ulcer of right ankle with fat layer exposed 04/03/2022 04/03/2022 L97.821 Non-pressure chronic ulcer of other  part of left lower leg limited to breakdown of skin 05/15/2022 05/15/2022 L97.422 Non-pressure chronic ulcer of left heel and midfoot with fat layer exposed 06/30/2022 06/30/2022 L97.811 Non-pressure chronic ulcer of other part of right lower leg limited to breakdown of skin 05/23/2022 05/23/2022 Resolved Problems Electronic Signature(s) Signed: 09/27/2022  4:39:44 PM By: Fredirick Maudlin MD FACS Entered By: Fredirick Maudlin on 09/27/2022 16:39:44 -------------------------------------------------------------------------------- Progress Note Details Patient Name: Date of Service: [name], [name] 09/27/2022 3:15 PM Medical Record Number: [id number] Patient Account Number: [id number] Date of Birth/Sex: Treating RN: 31-Dec-1950 (71 y.o. F) Primary Care Provider: Jim Like Other Clinician: Referring Provider: Treating Provider/Extender: Rory Percy in Treatment: 25 Subjective Chief Complaint Information obtained from Patient Patient presents for treatment of open ulcers due to venous insufficiency in the setting of poorly controlled DM1 and CHF History of Present Illness (HPI) ADMISSION 04/03/2022 This is a 71 year old woman with a past medical history notable for type 1 diabetes mellitus, poorly controlled with her most recent A1c being 8.3, congestive heart failure with recent admission for exacerbation secondary to noncompliance with diuretics, as well as history of pontine stroke and oxygen dependence. She has wounds on her bilateral lower extremities. She says they have been present for about 2 months and initially started as blisters. She says that she has compression stockings, but does not wear them because they are too difficult to get on. ABIs done in clinic were 1.4 and 1.5, suggesting some degree of noncompressibility. She also has a wound on her right heel that she says is secondary to it getting bumped frequently as she transfers. She came to clinic  with just dry dressings on her legs. She does have home health care, as well as her sister that have been assisting her with her dressings. She has multiple wounds. On the left medial calf, just above her ankle, there is a somewhat geographic wound with slough and some epithelialization. On the anterior tibial surface and lateral calf, the wounds are quite desiccated with yellow eschar present. On the right anterior tibial surface, the wounds are gritty with thin slough and eschar. The posterior right ankle wound is just above the calcaneus overlying the Achilles and has thick yellow-gray slough and a bit of odor. No purulent drainage appreciated. 04/10/2022: The wounds on her left leg are closed. On the right, the anterior tibial surface wounds are epithelializing and much cleaner. There is just a layer of biofilm on the open areas. On the posterior right ankle wound, there is a decreased volume of slough and no odor. We have been using Hydrofera Blue to all sites under compression. 04/17/2022: The wounds on her left leg remained closed and she did bring her juxta lite stockings with her today. On the right, the more distal wound is closed and the proximal wound has epithelialized significantly since her last visit. At the posterior right ankle, she still has accumulated some slough and some periwound callus but there is no odor or significant drainage. 04/24/2022: The right anterior leg wounds are closed. She still has a wound on her posterior right ankle that looks about the same as last week. She did not bring her right sided juxta light with her today but is wearing the left sided stocking. 05/01/2022: She has a tiny abrasion on her left anterior tibia which we did not add to her wound count today. The wound on her posterior right ankle is a little bit smaller with a little accumulated slough. No drainage or concern for infection. 05/08/2022: She continues to forget to bring her juxta lite stocking  with her so she remains in a compression wrap despite healing of the right anterior leg wounds. The wound on her posterior right ankle/calcaneus is a bit smaller today with just a little bit of  accumulated slough. She is wearing a juxta lite stocking on the left leg. 05/15/2022: She says that she has been wearing her juxta lite stockings at home, but today she is wearing regular compression stockings that she purchased from [employer]; the juxta lite stockings are in the laundry. The wound on her posterior right ankle/calcaneus continues to contract and has just a little bit of accumulated slough. Unfortunately, her edema control is suboptimal on the left and she has developed several small blisters with a larger 1 at the medial calf. 05/23/2022: The patient has been wearing her juxta lite stockings. Unfortunately, she has developed a large blister on her right lower leg just above the ankle. The skin overlying it is still intact. The blister that had opened on her left medial calf is smaller; and the other small blistered sites are closed. The posterior right ankle/calcaneus is about the same today. 06/07/2022: The only remaining open wound is on her posterior right calcaneus. The majority of this is covered with eschar. 06/30/2022: I am not sure why the patient did not return to clinic until today, but the ulcer on her right heel has healed. Unfortunately, she has developed a new ulcer on her left lateral foot. From the position of it, it looks as though the outward rotation of her lower leg at rest is probably causing the lateral midfoot to rub or press against the metal leg rests of her wheelchair. She is not padding the chair when she rests her leg on the leg rests. The new ulcer is fairly superficial with just the fat layer exposed. It is quite tender. No erythema, induration, or purulent drainage identified. 07/12/2022: After our last visit, she was seen by podiatry. Nail care was performed. For some  reason he had her apply silicone toe covers on her bilateral great toes. As result, the skin is now very macerated and she has a new ulcer on the knuckle of her right great toe. It is fairly small but does expose the fat layer. No erythema induration or purulent drainage although there was some odor on her feet when the caps were removed. The wound on her left lateral foot is smaller with just a small amount of slough buildup. Her Prevalon boots arrived and we will put her in those after her dressing is changed. 08/02/2022: She has been wearing her Prevalon boots to some extent, but says that she spends most of her day in her wheelchair and the boots slide off the foot rest. She apparently has not been utilizing the leg strap. The left lateral foot wound is tender and the tissue has some discoloration suggestive of ongoing pressure in this area. There is a little bit of callus accumulation circumferentially. The wound on her right great toe DIP joint is nearly closed and just has a light layer of eschar overlying the surface. No concern for infection. 08/16/2022: The wound on her right great toe has healed. The wound on her left lateral foot is quite a bit smaller with good perimeter epithelialization and a light layer of slough on the surface. 08/30/2022: Today she is complaining of a lot more pain on her left lateral foot. I think we are struggling with adequate padding and prevention of pressure in the site. The wound opening itself is quite small, but there is circumferential undermining from old skin. There is no odor or purulent drainage. 09/13/2022: The lateral foot wound looks significantly improved today. It is smaller and I no longer detect the undermining. 09/27/2022:  The lateral foot wound has healed. Unfortunately, she has developed a new wound that appears to be secondary to friction on her left great toe dorsal knuckle. There is a little bit of slough present but no concern for  infection. Patient History Information obtained from Patient. Family History Heart Disease - Mother, Hypertension - Mother,Father, Stroke - Child. Social History Never smoker, Marital Status - Divorced, Alcohol Use - Never, Drug Use - No History, Caffeine Use - Never. Medical History Eyes Patient has history of Cataracts Hematologic/Lymphatic Patient has history of Anemia Respiratory Patient has history of Sleep Apnea Cardiovascular Patient has history of Congestive Heart Failure, Hypertension Endocrine Patient has history of Type I Diabetes Oncologic Patient has history of Received Radiation Hospitalization/Surgery History - pacemaker implant. - loop recorder insertion and removal. - esophagogastroduodenoscopy. - TEE without cardioversion. - colonoscopy. - left and right heart catheterization. - abdominal angiogram. - thyroidectomy. - abdominal hysterectomy. - ankle fracture surgery. Medical A Surgical History Notes nd Ear/Nose/Mouth/Throat edentulous, GERD, seasonal allergies, trouble swallowing Hematologic/Lymphatic hyperlipidemia Cardiovascular complete heart block, bradycardia, paroxysmal atrial fibrillation, pacemaker Gastrointestinal hemorrhoids Endocrine hypothyroidism Genitourinary proteinuria, frequent UTIs Musculoskeletal decreased ROM L shoulder, hemiparesis affecting L side, arthritis Neurologic benign paroxysmal positional vertigo, intracranial vascular stenosis, lacunar infarction, stroke Oncologic thyroid cancer Psychiatric depression Objective Constitutional Bradycardic, asymptomatic.Marland Kitchen No acute distress.. Vitals Time Taken: 3:49 PM, Height: 67 in, Weight: 243 lbs, BMI: 38.1, Temperature: 97.8 F, Pulse: 54 bpm, Respiratory Rate: 18 breaths/min, Blood Pressure: 138/55 mmHg, Capillary Blood Glucose: 147 mg/dl. Respiratory Normal work of breathing on supplemental oxygen.. General Notes: 09/27/2022: The lateral foot wound has healed. Unfortunately, she  has developed a new wound that appears to be secondary to friction on her left great toe dorsal knuckle. There is a little bit of slough present but no concern for infection. Integumentary (Hair, Skin) Wound #11 status is Healed - Epithelialized. Original cause of wound was Pressure Injury. The date acquired was: 06/16/2022. The wound has been in treatment 12 weeks. The wound is located on the Left Foot. The wound measures 0cm length x 0cm width x 0cm depth; 0cm^2 area and 0cm^3 volume. There is Fat Layer (Subcutaneous Tissue) exposed. There is no tunneling or undermining noted. There is a none present amount of drainage noted. The wound margin is distinct with the outline attached to the wound base. There is large (67-100%) granulation within the wound bed. There is no necrotic tissue within the wound bed. The periwound skin appearance exhibited: Dry/Scaly. Wound #13 status is Open. Original cause of wound was Footwear Injury. The date acquired was: 09/06/2022. The wound is located on the Left T Great. The oe wound measures 1cm length x 0.8cm width x 0.1cm depth; 0.628cm^2 area and 0.063cm^3 volume. There is Fat Layer (Subcutaneous Tissue) exposed. There is no tunneling or undermining noted. There is a medium amount of serosanguineous drainage noted. There is medium (34-66%) red granulation within the wound bed. There is a medium (34-66%) amount of necrotic tissue within the wound bed including Eschar and Adherent Slough. The periwound skin appearance exhibited: Dry/Scaly, Maceration. 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 There was a Selective/Open Wound Non-Viable Tissue Debridement with a total area of 0.8 sq cm performed by Fredirick Maudlin, MD. With the following instrument(s): Curette to remove Non-Viable tissue/material. Material removed includes [facility] after achieving pain control using Lidocaine 5% topical ointment. No specimens were taken. A time out was conducted at 16:01, prior to the start of the procedure. A Minimum amount of bleeding was controlled with Pressure. The procedure was tolerated well with a pain level of 0 throughout and a pain level of 0 following the procedure. Post Debridement Measurements: 1cm length x 0.8cm width x 0.1cm depth; 0.063cm^3 volume. Character of Wound/Ulcer Post Debridement requires further debridement. Severity of Tissue Post Debridement is: Fat layer exposed. Post procedure Diagnosis Wound #13: Same as Pre-Procedure General Notes: Scribed for Dr. Celine Ahr by Blanche East, RN. Plan Follow-up Appointments: Return Appointment in 2 weeks. - Dr. Celine Ahr - room 3 Anesthetic: Wound #13 Left T Great: oe (In clinic) Topical Lidocaine 5% applied to wound bed - in clinic, prior to debridement Bathing/ Shower/ Hygiene: May shower and wash wound with soap and water. - with dressing changes Edema Control - Lymphedema / SCD / Other: Elevate legs to the level of the heart or above for 30 minutes daily and/or when sitting, a frequency of: - throughout the day Patient to wear own compression stockings every day. - juxtalites to both legs Exercise regularly Off-Loading: Other: - Prevalon boot to both feet. Please put pillow between foot and foot rest so the foot is not resting on the metal footrest. Home Health: Admit to [facility] for wound care. May utilize formulary equivalent dressing for wound  treatment orders unless otherwise specified. New wound care orders this week; continue Home Health for wound care. May utilize formulary equivalent dressing for wound treatment orders unless otherwise specified. - No need for gentamicin at this time Other Home Health Orders/Instructions: [facility] WOUND #13: - T Great Wound Laterality: Left oe Cleanser: Soap and Water Discharge Instructions: May shower and wash wound with dial antibacterial soap and water prior to dressing change. Peri-Wound Care: Sween Lotion (Moisturizing lotion) Discharge Instructions: Apply moisturizing lotion as directed Prim Dressing: KerraCel Ag Gelling Fiber Dressing, 2x2 in (silver alginate) ary Discharge Instructions: Apply silver alginate to wound bed as instructed Secondary Dressing: ALLEVYN Gentle Border, 4x4 (in/in) Discharge Instructions: Apply over primary dressing as directed. Secured With: [employer], 4.5x3.1 (in/yd) Discharge Instructions: Secure with Kerlix as directed. Secured With: [employer] T ape, 2x10 (in/yd) Discharge Instructions: Secure dressing with tape as directed. 09/27/2022: The lateral foot wound has healed. Unfortunately, she has developed a new wound that appears to be secondary to friction on her left great toe dorsal knuckle. There is a little bit of slough present but no concern for infection. I used a curette to debride slough off of her toe wound. We will apply silver alginate to this location. She needs to be diligent in wearing her Prevalon boots to avoid reopening the pressure wound on the lateral aspect of her left foot. She will follow-up in 2 weeks' time. Electronic Signature(s) Signed: 09/27/2022 4:50:05 PM By: Fredirick Maudlin MD FACS Previous Signature: 09/27/2022 4:47:40 PM Version By: Fredirick Maudlin MD FACS Entered By: Fredirick Maudlin on 09/27/2022 16:50:04 -------------------------------------------------------------------------------- HxROS  Details Patient Name: Date of Service: [name], [name] 09/27/2022 3:15 PM Medical Record Number: [id number] Patient Account Number: [id number] Date of Birth/Sex: Treating RN: 04/07/1951 (71  y.o. F) Primary Care Provider: Jim Like Other Clinician: Referring Provider: Treating Provider/Extender: Rory Percy in Treatment: 25 Information Obtained From Patient Eyes Medical History: Positive for: Cataracts Ear/Nose/Mouth/Throat Medical History: Past Medical History Notes: edentulous, GERD, seasonal allergies, trouble swallowing Hematologic/Lymphatic Medical History: Positive for: Anemia Past Medical History Notes: hyperlipidemia Respiratory Medical History: Positive for: Sleep Apnea Cardiovascular Medical History: Positive for: Congestive Heart Failure; Hypertension Past Medical History Notes: complete heart block, bradycardia, paroxysmal atrial fibrillation, pacemaker Gastrointestinal Medical History: Past Medical History Notes: hemorrhoids Endocrine Medical History: Positive for: Type I Diabetes Past Medical History Notes: hypothyroidism Time with diabetes: 1 yrs Treated with: Insulin Blood sugar tested every day: Yes Tested : on pump Genitourinary Medical History: Past Medical History Notes: proteinuria, frequent UTIs Musculoskeletal Medical History: Past Medical History Notes: decreased ROM L shoulder, hemiparesis affecting L side, arthritis Neurologic Medical History: Past Medical History Notes: benign paroxysmal positional vertigo, intracranial vascular stenosis, lacunar infarction, stroke Oncologic Medical History: Positive for: Received Radiation Past Medical History Notes: thyroid cancer Psychiatric Medical History: Past Medical History Notes: depression HBO Extended History Items Eyes: Cataracts Immunizations Pneumococcal Vaccine: Received Pneumococcal Vaccination: Yes Received Pneumococcal Vaccination  On or After 60th Birthday: Yes Implantable Devices Yes Hospitalization / Surgery History Type of Hospitalization/Surgery pacemaker implant loop recorder insertion and removal esophagogastroduodenoscopy TEE without cardioversion colonoscopy left and right heart catheterization abdominal angiogram thyroidectomy abdominal hysterectomy ankle fracture surgery Family and Social History Heart Disease: Yes - Mother; Hypertension: Yes - Mother,Father; Stroke: Yes - Child; Never smoker; Marital Status - Divorced; Alcohol Use: Never; Drug Use: No History; Caffeine Use: Never; Financial Concerns: No; Food, Clothing or Shelter Needs: No; Support System Lacking: No; Transportation Concerns: No Electronic Signature(s) Signed: 09/27/2022 4:56:11 PM By: Fredirick Maudlin MD FACS Entered By: Fredirick Maudlin on 09/27/2022 16:46:14 -------------------------------------------------------------------------------- SuperBill Details Patient Name: Date of Service: [name], [name] 09/27/2022 Medical Record Number: [id number] Patient Account Number: [id number] Date of Birth/Sex: Treating RN: 04/29/1951 (71 y.o. 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 O27.74 Type 1 diabetes mellitus with diabetic polyneuropathy Z68.41 Body mass index [BMI]40.0-44.9, adult E10.622 Type 1 diabetes mellitus with other skin ulcer L97.522 Non-pressure chronic ulcer of other part of left foot with fat layer exposed Facility Procedures CPT4 Code: [id number] Description: [phone] - DEBRIDE WOUND 1ST 20 SQ CM OR < ICD-10 Diagnosis Description L97.522 Non-pressure chronic ulcer of other part of left  foot with fat layer exposed Modifier: Quantity: 1 Physician Procedures : CPT4 Code Description Modifier [id number] - WC PHYS LEVEL 3 - EST PT 25 ICD-10 Diagnosis Description L97.522 Non-pressure chronic ulcer of other part of left foot with fat layer exposed E10.622 Type 1 diabetes mellitus with other skin ulcer I89.0  Lymphedema, not elsewhere classified T65.46 Chronic diastolic (congestive) heart failure Quantity: 1 : [id number] - WC PHYS DEBR WO ANESTH 20 SQ CM ICD-10 Diagnosis Description L97.522 Non-pressure chronic ulcer of other part of left foot with fat layer exposed Quantity: 1 Electronic Signature(s) Signed: 09/27/2022 4:49:28 PM By: Fredirick Maudlin MD FACS Entered By: Fredirick Maudlin on 09/27/2022 16:49:28

## 2022-09-28 NOTE — Telephone Encounter (Signed)
Elle calls nurse line again requesting to speak with office manager.

## 2022-09-29 NOTE — Telephone Encounter (Signed)
Returned call to [facility] to address the following complaints and my responses:  We only let her see PA's because of her age: Advised that her age has nothing to do with her treatment and that we do not have PA's here.  Explained that we are a residency, which pt is aware of, and that our faculty providers are not accepting new patients at this time.  She reports that she told us that she was going to report [place] and we told her to go ahead: this is her right and we would support that option if that is what she chooses to do  She reports that she asked for me to call back and I never did:  I attempted to call x2, patient never answered the phone.  She reports office manager was rude to her: I never spoke with her, so I am unsure who she spoke with.  Finally states that she was given the run around about medications: Advised that we have filled every med she has requested other than tramadol.  She would need an appt for that because her last in person visit was in 05/2022.   [employer] asked that I give patient a followup call.   Again called patient but no answer.  Christen Bame, CMA

## 2022-10-05 ENCOUNTER — Ambulatory Visit (INDEPENDENT_AMBULATORY_CARE_PROVIDER_SITE_OTHER): Payer: Medicare PPO

## 2022-10-05 DIAGNOSIS — I442 Atrioventricular block, complete: Secondary | ICD-10-CM | POA: Diagnosis not present

## 2022-10-05 LAB — CUP PACEART REMOTE DEVICE CHECK
Battery Remaining Longevity: 113 mo
Battery Voltage: 3.01 V
Brady Statistic AP VP Percent: 48.59 %
Brady Statistic AP VS Percent: 0.25 %
Brady Statistic AS VP Percent: 50.19 %
Brady Statistic AS VS Percent: 0.96 %
Brady Statistic RA Percent Paced: 45.87 %
Brady Statistic RV Percent Paced: 98.47 %
Date Time Interrogation Session: 20231012062223
Implantable Lead Implant Date: 20210702
Implantable Lead Implant Date: 20210702
Implantable Lead Location: 753859
Implantable Lead Location: 753860
Implantable Lead Model: 5076
Implantable Lead Model: 5076
Implantable Pulse Generator Implant Date: 20210702
Lead Channel Impedance Value: 266 Ohm
Lead Channel Impedance Value: 304 Ohm
Lead Channel Impedance Value: 380 Ohm
Lead Channel Impedance Value: 380 Ohm
Lead Channel Pacing Threshold Amplitude: 0.625 V
Lead Channel Pacing Threshold Amplitude: 0.625 V
Lead Channel Pacing Threshold Pulse Width: 0.4 ms
Lead Channel Pacing Threshold Pulse Width: 0.4 ms
Lead Channel Sensing Intrinsic Amplitude: 1.625 mV
Lead Channel Sensing Intrinsic Amplitude: 1.625 mV
Lead Channel Sensing Intrinsic Amplitude: 9.25 mV
Lead Channel Sensing Intrinsic Amplitude: 9.25 mV
Lead Channel Setting Pacing Amplitude: 1.5 V
Lead Channel Setting Pacing Amplitude: 2.5 V
Lead Channel Setting Pacing Pulse Width: 0.4 ms
Lead Channel Setting Sensing Sensitivity: 1.2 mV

## 2022-10-09 ENCOUNTER — Encounter: Payer: Self-pay | Admitting: Podiatry

## 2022-10-09 ENCOUNTER — Ambulatory Visit (INDEPENDENT_AMBULATORY_CARE_PROVIDER_SITE_OTHER): Payer: Medicare PPO | Admitting: Podiatry

## 2022-10-09 DIAGNOSIS — D689 Coagulation defect, unspecified: Secondary | ICD-10-CM

## 2022-10-09 DIAGNOSIS — M79674 Pain in right toe(s): Secondary | ICD-10-CM

## 2022-10-09 DIAGNOSIS — E1159 Type 2 diabetes mellitus with other circulatory complications: Secondary | ICD-10-CM | POA: Diagnosis not present

## 2022-10-09 DIAGNOSIS — M2030 Hallux varus (acquired), unspecified foot: Secondary | ICD-10-CM | POA: Diagnosis not present

## 2022-10-09 DIAGNOSIS — M79675 Pain in left toe(s): Secondary | ICD-10-CM | POA: Diagnosis not present

## 2022-10-09 DIAGNOSIS — B351 Tinea unguium: Secondary | ICD-10-CM

## 2022-10-09 NOTE — Progress Notes (Signed)
This patient returns to my office for at risk foot care.  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  This patient is unable to cut nails herself  since the patient cannot reach her  nails.These nails are painful walking and wearing shoes.  Patient presents in a wheelchair  with her granddaughter..  This patient presents for at risk foot care today.  General Appearance  Alert, conversant and in no acute stress.  Vascular  Dorsalis pedis and posterior tibial  pulses are absent due to swelling. bilaterally.  Capillary return is within normal limits  bilaterally. Temperature is within normal limits  bilaterally.  Neurologic  Senn-Weinstein monofilament wire diminished  bilaterally. Muscle power within normal limits bilaterally.  Nails Thick disfigured discolored nails with subungual debris  from hallux to fifth toes bilaterally. No evidence of bacterial infection or drainage bilaterally.  Orthopedic  No limitations of motion  feet .  No crepitus or effusions noted.  No bony pathology or digital deformities noted.  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  Hallux  malleus  B/L  Skin  normotropic skin with no porokeratosis noted bilaterally.  Skin lesion on dorsum of hallux malleus  B/L.  Ulcer left foot treated by wound center.   Onychomycosis  Pain in right toes  Pain in left toes  Consent was obtained for treatment procedures.   Mechanical debridement of nails 1-5  bilaterally performed with a nail nipper.  Filed with dremel without incident. She has developed an ulcer due to her hallux malleus and is being treated by wound care center.    Return office visit    3 months       Told patient to return for periodic foot care and evaluation due to potential at risk complications.   Gardiner Barefoot DPM

## 2022-10-11 ENCOUNTER — Encounter (HOSPITAL_BASED_OUTPATIENT_CLINIC_OR_DEPARTMENT_OTHER): Payer: Medicare PPO | Admitting: General Surgery

## 2022-10-11 DIAGNOSIS — L97522 Non-pressure chronic ulcer of other part of left foot with fat layer exposed: Secondary | ICD-10-CM | POA: Diagnosis not present

## 2022-10-11 DIAGNOSIS — I890 Lymphedema, not elsewhere classified: Secondary | ICD-10-CM | POA: Diagnosis not present

## 2022-10-11 DIAGNOSIS — E10621 Type 1 diabetes mellitus with foot ulcer: Secondary | ICD-10-CM | POA: Diagnosis not present

## 2022-10-11 DIAGNOSIS — I110 Hypertensive heart disease with heart failure: Secondary | ICD-10-CM | POA: Diagnosis not present

## 2022-10-11 DIAGNOSIS — E1042 Type 1 diabetes mellitus with diabetic polyneuropathy: Secondary | ICD-10-CM | POA: Diagnosis not present

## 2022-10-11 DIAGNOSIS — I5032 Chronic diastolic (congestive) heart failure: Secondary | ICD-10-CM | POA: Diagnosis not present

## 2022-10-11 DIAGNOSIS — E1065 Type 1 diabetes mellitus with hyperglycemia: Secondary | ICD-10-CM | POA: Diagnosis not present

## 2022-10-11 NOTE — Progress Notes (Addendum)
[name], [name] ([id number]) [id number] Page 1 of 10 Visit Report for 10/11/2022 Chief Complaint Document Details Patient Name: Date of Service: [name], [name]. 10/11/2022 3:15 PM Medical Record Number: [id number] Patient Account Number: [id number] Date of Birth/Sex: Treating RN: November 10, 1951 (71 y.o. F) Primary Care Provider: Jim Like Other Clinician: Referring Provider: Treating Provider/Extender: Rory Percy in Treatment: 27 Information Obtained from: Patient Chief Complaint Patient presents for treatment of open ulcers due to venous insufficiency in the setting of poorly controlled DM1 and CHF Electronic Signature(s) Signed: 10/11/2022 3:55:26 PM By: Fredirick Maudlin MD FACS Entered By: Fredirick Maudlin on 10/11/2022 15:55:26 -------------------------------------------------------------------------------- Debridement Details Patient Name: Date of Service: [name], [name] 10/11/2022 3:15 PM Medical Record Number: [id number] Patient Account Number: [id number] Date of Birth/Sex: Treating RN: 11-07-1951 (71 y.o. [name] Primary Care Provider: Jim Like Other Clinician: Referring Provider: Treating Provider/Extender: Rory Percy in Treatment: 27 Debridement Performed for Assessment: Wound #13 Left T Great oe Performed By: Physician Fredirick Maudlin, MD Debridement Type: Debridement Severity of Tissue Pre Debridement: Fat layer exposed Level of Consciousness (Pre-procedure): Awake and Alert Pre-procedure Verification/Time Out Yes - 15:50 Taken: Start Time: 15:50 Pain Control: Lidocaine 5% topical ointment T Area Debrided (L x W): otal 0.5 (cm) x 0.4 (cm) = 0.2 (cm) Tissue and other material debrided: Non-Viable, Eschar, Slough, Slough Level: Non-Viable Tissue Debridement Description: Selective/Open Wound Instrument: Curette Bleeding: Minimum Hemostasis  Achieved: Pressure End Time: 15:51 Procedural Pain: 0 Post Procedural Pain: 0 Response to Treatment: Procedure was tolerated well Level of Consciousness (Post- Awake and Alert procedure): Post Debridement Measurements of Total Wound Length: (cm) 0.5 Width: (cm) 0.4 Depth: (cm) 0.1 Volume: (cm) 0.016 Character of Wound/Ulcer Post Debridement: Improved Severity of Tissue Post Debridement: Fat layer exposed [name], [name] ([id number]) [id number] Page 2 of 10 Post Procedure Diagnosis Same as Pre-procedure Notes Scribed by J.Scotton for Dr. Celine Ahr Electronic Signature(s) Signed: 10/11/2022 4:40:20 PM By: Fredirick Maudlin MD FACS Signed: 10/11/2022 5:11:49 PM By: Dellie Catholic RN Entered By: Dellie Catholic on 10/11/2022 15:56:59 -------------------------------------------------------------------------------- HPI Details Patient Name: Date of Service: [name], [name] 10/11/2022 3:15 PM Medical Record Number: [id number] Patient Account Number: [id number] Date of Birth/Sex: Treating RN: August 23, 1951 (71 y.o. F) Primary Care Provider: Jim Like Other Clinician: Referring Provider: Treating Provider/Extender: Rory Percy in Treatment: 27 History of Present Illness HPI Description: ADMISSION 04/03/2022 This is a 71 year old woman with a past medical history notable for type 1 diabetes mellitus, poorly controlled with her most recent A1c being 8.3, congestive heart failure with recent admission for exacerbation secondary to noncompliance with diuretics, as well as history of pontine stroke and oxygen dependence. She has wounds on her bilateral lower extremities. She says they have been present for about 2 months and initially started as blisters. She says that she has compression stockings, but does not wear them because they are too difficult to get on. ABIs done in clinic were 1.4 and 1.5, suggesting some degree  of noncompressibility. She also has a wound on her right heel that she says is secondary to it getting bumped frequently as she transfers. She came to clinic with just dry dressings on her legs. She does have home health care, as well as her sister that have been assisting her with her dressings. She has multiple wounds. On the left medial calf, just above her ankle, there is a somewhat geographic wound with slough and some  epithelialization. On the anterior tibial surface and lateral calf, the wounds are quite desiccated with yellow eschar present. On the right anterior tibial surface, the wounds are gritty with thin slough and eschar. The posterior right ankle wound is just above the calcaneus overlying the Achilles and has thick yellow-gray slough and a bit of odor. No purulent drainage appreciated. 04/10/2022: The wounds on her left leg are closed. On the right, the anterior tibial surface wounds are epithelializing and much cleaner. There is just a layer of biofilm on the open areas. On the posterior right ankle wound, there is a decreased volume of slough and no odor. We have been using Hydrofera Blue to all sites under compression. 04/17/2022: The wounds on her left leg remained closed and she did bring her juxta lite stockings with her today. On the right, the more distal wound is closed and the proximal wound has epithelialized significantly since her last visit. At the posterior right ankle, she still has accumulated some slough and some periwound callus but there is no odor or significant drainage. 04/24/2022: The right anterior leg wounds are closed. She still has a wound on her posterior right ankle that looks about the same as last week. She did not bring her right sided juxta light with her today but is wearing the left sided stocking. 05/01/2022: She has a tiny abrasion on her left anterior tibia which we did not add to her wound count today. The wound on her posterior right ankle is a  little bit smaller with a little accumulated slough. No drainage or concern for infection. 05/08/2022: She continues to forget to bring her juxta lite stocking with her so she remains in a compression wrap despite healing of the right anterior leg wounds. The wound on her posterior right ankle/calcaneus is a bit smaller today with just a little bit of accumulated slough. She is wearing a juxta lite stocking on the left leg. 05/15/2022: She says that she has been wearing her juxta lite stockings at home, but today she is wearing regular compression stockings that she purchased from [employer]; the juxta lite stockings are in the laundry. The wound on her posterior right ankle/calcaneus continues to contract and has just a little bit of accumulated slough. Unfortunately, her edema control is suboptimal on the left and she has developed several small blisters with a larger 1 at the medial calf. 05/23/2022: The patient has been wearing her juxta lite stockings. Unfortunately, she has developed a large blister on her right lower leg just above the ankle. The skin overlying it is still intact. The blister that had opened on her left medial calf is smaller; and the other small blistered sites are closed. The posterior right ankle/calcaneus is about the same today. 06/07/2022: The only remaining open wound is on her posterior right calcaneus. The majority of this is covered with eschar. 06/30/2022: I am not sure why the patient did not return to clinic until today, but the ulcer on her right heel has healed. Unfortunately, she has developed a new ulcer on her left lateral foot. From the position of it, it looks as though the outward rotation of her lower leg at rest is probably causing the lateral midfoot to rub or press against the metal leg rests of her wheelchair. She is not padding the chair when she rests her leg on the leg rests. The new ulcer is fairly superficial with just the fat layer exposed. It is quite  tender. No erythema, induration, or  purulent drainage identified. 07/12/2022: After our last visit, she was seen by podiatry. Nail care was performed. For some reason he had her apply silicone toe covers on her bilateral great toes. As result, the skin is now very macerated and she has a new ulcer on the knuckle of her right great toe. It is fairly small but does expose the fat layer. No erythema induration or purulent drainage although there was some odor on her feet when the caps were removed. The wound on her left lateral foot is smaller with just a small amount of slough buildup. Her Prevalon boots arrived and we will put her in those after her dressing is changed. Aplin, [name] ([id number]) [id number] Page 3 of 10 08/02/2022: She has been wearing her Prevalon boots to some extent, but says that she spends most of her day in her wheelchair and the boots slide off the foot rest. She apparently has not been utilizing the leg strap. The left lateral foot wound is tender and the tissue has some discoloration suggestive of ongoing pressure in this area. There is a little bit of callus accumulation circumferentially. The wound on her right great toe DIP joint is nearly closed and just has a light layer of eschar overlying the surface. No concern for infection. 08/16/2022: The wound on her right great toe has healed. The wound on her left lateral foot is quite a bit smaller with good perimeter epithelialization and a light layer of slough on the surface. 08/30/2022: Today she is complaining of a lot more pain on her left lateral foot. I think we are struggling with adequate padding and prevention of pressure in the site. The wound opening itself is quite small, but there is circumferential undermining from old skin. There is no odor or purulent drainage. 09/13/2022: The lateral foot wound looks significantly improved today. It is smaller and I no longer detect the  undermining. 09/27/2022: The lateral foot wound has healed. Unfortunately, she has developed a new wound that appears to be secondary to friction on her left great toe dorsal knuckle. There is a little bit of slough present but no concern for infection. 10/11/2022: The wound on her left great toe is half the size as it was last week. There is light slough and some periwound eschar. No concern for infection. Electronic Signature(s) Signed: 10/11/2022 3:55:59 PM By: Fredirick Maudlin MD FACS Entered By: Fredirick Maudlin on 10/11/2022 15:55:59 -------------------------------------------------------------------------------- Physical Exam Details Patient Name: Date of Service: [place], [name] 10/11/2022 3:15 PM Medical Record Number: [id number] Patient Account Number: [id number] Date of Birth/Sex: Treating RN: 1951/08/21 (72 y.o. F) Primary Care Provider: Jim Like Other Clinician: Referring Provider: Treating Provider/Extender: Rory Percy in Treatment: 27 Constitutional Hypertensive, asymptomatic. . . . No acute distress.Marland Kitchen Respiratory Normal work of breathing on room air.. Notes 10/11/2022: The wound on her left great toe is half the size as it was last week. There is light slough and some periwound eschar. No concern for infection. Electronic Signature(s) Signed: 10/11/2022 3:56:37 PM By: Fredirick Maudlin MD FACS Entered By: Fredirick Maudlin on 10/11/2022 15:56:37 -------------------------------------------------------------------------------- Physician Orders Details Patient Name: Date of Service: Munday, [name] 10/11/2022 3:15 PM Medical Record Number: [id number] Patient Account Number: [id number] Date of Birth/Sex: Treating RN: 07/02/1951 (71 y.o. [name] Primary Care Provider: Jim Like Other Clinician: Referring Provider: Treating Provider/Extender: Rory Percy in Treatment: 27 Verbal / Phone  Orders: No Diagnosis Coding ICD-10 Coding  Code Description I89.0 Lymphedema, not elsewhere classified W80.32 Chronic diastolic (congestive) heart failure Rizzi, [name] ([id number]) [id number] Page 4 of 10 E10.59 Type 1 diabetes mellitus with other circulatory complications B70.48 Type 1 diabetes mellitus with diabetic polyneuropathy Z68.41 Body mass index [BMI]40.0-44.9, adult E10.622 Type 1 diabetes mellitus with other skin ulcer L97.522 Non-pressure chronic ulcer of other part of left foot with fat layer exposed Follow-up Appointments ppointment in 2 weeks. - Dr. Celine Ahr - Room 3 Wednesday November 1st at 3:15pm Return A Anesthetic Wound #13 Left T Great oe (In clinic) Topical Lidocaine 5% applied to wound bed - in clinic, prior to debridement Bathing/ Shower/ Hygiene May shower and wash wound with soap and water. - with dressing changes Edema Control - Lymphedema / SCD / Other Bilateral Lower Extremities Elevate legs to the level of the heart or above for 30 minutes daily and/or when sitting, a frequency of: - throughout the day Patient to wear own compression stockings every day. - juxtalites to both legs Exercise regularly Off-Loading Other: - Prevalon boot to both feet. Please put pillow between foot and foot rest so the foot is not resting on the metal footrest. Home Health Admit to [facility] for wound care. May utilize formulary equivalent dressing for wound treatment orders unless otherwise specified. New wound care orders this week; continue Home Health for wound care. May utilize formulary equivalent dressing for wound treatment orders unless otherwise specified. - No need for gentamicin at this time Other Home Health Orders/Instructions: [address] [facility] Wound Treatment Wound #13 - T Great oe Wound Laterality: Left Cleanser: Soap and Water Discharge Instructions: May shower and wash wound with dial antibacterial soap and water prior to  dressing change. Peri-Wound Care: Sween Lotion (Moisturizing lotion) Discharge Instructions: Apply moisturizing lotion as directed Prim Dressing: KerraCel Ag Gelling Fiber Dressing, 2x2 in (silver alginate) ary Discharge Instructions: Apply silver alginate to wound bed as instructed Secondary Dressing: ALLEVYN Gentle Border, 4x4 (in/in) Discharge Instructions: Apply over primary dressing as directed. Secured With: [employer], 4.5x3.1 (in/yd) Discharge Instructions: Secure with Kerlix as directed. Secured With: Transpore Surgical Tape, 2x10 (in/yd) Discharge Instructions: Secure dressing with tape as directed. Electronic Signature(s) Signed: 10/11/2022 4:40:20 PM By: Fredirick Maudlin MD FACS Signed: 10/11/2022 5:11:49 PM By: Dellie Catholic RN Previous Signature: 10/11/2022 3:58:40 PM Version By: Fredirick Maudlin MD FACS Entered By: Dellie Catholic on 10/11/2022 15:58:55 -------------------------------------------------------------------------------- Problem List Details Patient Name: Date of Service: [name], RO [place] [name] 10/11/2022 3:15 PM Medical Record Number: [id number] Patient Account Number: [id number] Date of Birth/Sex: Treating RN: 1951/07/09 (71 y.o. F) Primary Care Provider: Jim Like Other Clinician: Referring Provider: Treating Provider/Extender: Rory Percy in Treatment: [address], [name] ([id number]) [id number] Page 5 of 10 Active Problems ICD-10 Encounter Code Description Active Date MDM Diagnosis I89.0 Lymphedema, not elsewhere classified 04/03/2022 No Yes K91.79 Chronic diastolic (congestive) heart failure 04/03/2022 No Yes E10.59 Type 1 diabetes mellitus with other circulatory complications 1/50/5697 No Yes E10.42 Type 1 diabetes mellitus with diabetic polyneuropathy 04/03/2022 No Yes Z68.41 Body mass index [BMI]40.0-44.9, adult 04/03/2022 No Yes E10.622 Type 1 diabetes mellitus with other  skin ulcer 04/03/2022 No Yes L97.522 Non-pressure chronic ulcer of other part of left foot with fat layer exposed 07/12/2022 No Yes Inactive Problems ICD-10 Code Description Active Date Inactive Date L97.212 Non-pressure chronic ulcer of right calf with fat layer exposed 04/03/2022 04/03/2022 L97.222 Non-pressure chronic ulcer of left calf with fat layer exposed 04/03/2022 04/03/2022 L97.312 Non-pressure chronic  ulcer of right ankle with fat layer exposed 04/03/2022 04/03/2022 L97.821 Non-pressure chronic ulcer of other part of left lower leg limited to breakdown of skin 05/15/2022 05/15/2022 L97.422 Non-pressure chronic ulcer of left heel and midfoot with fat layer exposed 06/30/2022 06/30/2022 L97.811 Non-pressure chronic ulcer of other part of right lower leg limited to breakdown of skin 05/23/2022 05/23/2022 Resolved Problems Electronic Signature(s) Signed: 10/11/2022 3:55:09 PM By: Fredirick Maudlin MD FACS Entered By: Fredirick Maudlin on 10/11/2022 15:55:09 -------------------------------------------------------------------------------- Progress Note Details Patient Name: Date of Service: [place]ml[place], [name] 10/11/2022 3:15 PM Demore, [name] ([id number]) [id number] Page 6 of 10 Medical Record Number: [id number] Patient Account Number: [id number] Date of Birth/Sex: Treating RN: 1951/10/27 (71 y.o. F) Primary Care Provider: Jim Like Other Clinician: Referring Provider: Treating Provider/Extender: Rory Percy in Treatment: 27 Subjective Chief Complaint Information obtained from Patient Patient presents for treatment of open ulcers due to venous insufficiency in the setting of poorly controlled DM1 and CHF History of Present Illness (HPI) ADMISSION 04/03/2022 This is a 71 year old woman with a past medical history notable for type 1 diabetes mellitus, poorly controlled with her most recent A1c being 8.3, congestive heart failure  with recent admission for exacerbation secondary to noncompliance with diuretics, as well as history of pontine stroke and oxygen dependence. She has wounds on her bilateral lower extremities. She says they have been present for about 2 months and initially started as blisters. She says that she has compression stockings, but does not wear them because they are too difficult to get on. ABIs done in clinic were 1.4 and 1.5, suggesting some degree of noncompressibility. She also has a wound on her right heel that she says is secondary to it getting bumped frequently as she transfers. She came to clinic with just dry dressings on her legs. She does have home health care, as well as her sister that have been assisting her with her dressings. She has multiple wounds. On the left medial calf, just above her ankle, there is a somewhat geographic wound with slough and some epithelialization. On the anterior tibial surface and lateral calf, the wounds are quite desiccated with yellow eschar present. On the right anterior tibial surface, the wounds are gritty with thin slough and eschar. The posterior right ankle wound is just above the calcaneus overlying the Achilles and has thick yellow-gray slough and a bit of odor. No purulent drainage appreciated. 04/10/2022: The wounds on her left leg are closed. On the right, the anterior tibial surface wounds are epithelializing and much cleaner. There is just a layer of biofilm on the open areas. On the posterior right ankle wound, there is a decreased volume of slough and no odor. We have been using Hydrofera Blue to all sites under compression. 04/17/2022: The wounds on her left leg remained closed and she did bring her juxta lite stockings with her today. On the right, the more distal wound is closed and the proximal wound has epithelialized significantly since her last visit. At the posterior right ankle, she still has accumulated some slough and some periwound  callus but there is no odor or significant drainage. 04/24/2022: The right anterior leg wounds are closed. She still has a wound on her posterior right ankle that looks about the same as last week. She did not bring her right sided juxta light with her today but is wearing the left sided stocking. 05/01/2022: She has a tiny abrasion on her left anterior tibia which we  did not add to her wound count today. The wound on her posterior right ankle is a little bit smaller with a little accumulated slough. No drainage or concern for infection. 05/08/2022: She continues to forget to bring her juxta lite stocking with her so she remains in a compression wrap despite healing of the right anterior leg wounds. The wound on her posterior right ankle/calcaneus is a bit smaller today with just a little bit of accumulated slough. She is wearing a juxta lite stocking on the left leg. 05/15/2022: She says that she has been wearing her juxta lite stockings at home, but today she is wearing regular compression stockings that she purchased from [employer]; the juxta lite stockings are in the laundry. The wound on her posterior right ankle/calcaneus continues to contract and has just a little bit of accumulated slough. Unfortunately, her edema control is suboptimal on the left and she has developed several small blisters with a larger 1 at the medial calf. 05/23/2022: The patient has been wearing her juxta lite stockings. Unfortunately, she has developed a large blister on her right lower leg just above the ankle. The skin overlying it is still intact. The blister that had opened on her left medial calf is smaller; and the other small blistered sites are closed. The posterior right ankle/calcaneus is about the same today. 06/07/2022: The only remaining open wound is on her posterior right calcaneus. The majority of this is covered with eschar. 06/30/2022: I am not sure why the patient did not return to clinic until today, but the ulcer  on her right heel has healed. Unfortunately, she has developed a new ulcer on her left lateral foot. From the position of it, it looks as though the outward rotation of her lower leg at rest is probably causing the lateral midfoot to rub or press against the metal leg rests of her wheelchair. She is not padding the chair when she rests her leg on the leg rests. The new ulcer is fairly superficial with just the fat layer exposed. It is quite tender. No erythema, induration, or purulent drainage identified. 07/12/2022: After our last visit, she was seen by podiatry. Nail care was performed. For some reason he had her apply silicone toe covers on her bilateral great toes. As result, the skin is now very macerated and she has a new ulcer on the knuckle of her right great toe. It is fairly small but does expose the fat layer. No erythema induration or purulent drainage although there was some odor on her feet when the caps were removed. The wound on her left lateral foot is smaller with just a small amount of slough buildup. Her Prevalon boots arrived and we will put her in those after her dressing is changed. 08/02/2022: She has been wearing her Prevalon boots to some extent, but says that she spends most of her day in her wheelchair and the boots slide off the foot rest. She apparently has not been utilizing the leg strap. The left lateral foot wound is tender and the tissue has some discoloration suggestive of ongoing pressure in this area. There is a little bit of callus accumulation circumferentially. The wound on her right great toe DIP joint is nearly closed and just has a light layer of eschar overlying the surface. No concern for infection. 08/16/2022: The wound on her right great toe has healed. The wound on her left lateral foot is quite a bit smaller with good perimeter epithelialization and a light layer  of slough on the surface. 08/30/2022: Today she is complaining of a lot more pain on her left  lateral foot. I think we are struggling with adequate padding and prevention of pressure in the site. The wound opening itself is quite small, but there is circumferential undermining from old skin. There is no odor or purulent drainage. 09/13/2022: The lateral foot wound looks significantly improved today. It is smaller and I no longer detect the undermining. 09/27/2022: The lateral foot wound has healed. Unfortunately, she has developed a new wound that appears to be secondary to friction on her left great toe dorsal knuckle. There is a little bit of slough present but no concern for infection. 10/11/2022: The wound on her left great toe is half the size as it was last week. There is light slough and some periwound eschar. No concern for infection. Patient History Information obtained from Patient. Family History Heart Disease - Mother, Hypertension - Mother,Father, Stroke - Child. Fulop, [name] ([id number]) [id number] Page 7 of 10 Social History Never smoker, Marital Status - Divorced, Alcohol Use - Never, Drug Use - No History, Caffeine Use - Never. Medical History Eyes Patient has history of Cataracts Hematologic/Lymphatic Patient has history of Anemia Respiratory Patient has history of Sleep Apnea Cardiovascular Patient has history of Congestive Heart Failure, Hypertension Endocrine Patient has history of Type I Diabetes Oncologic Patient has history of Received Radiation Hospitalization/Surgery History - pacemaker implant. - loop recorder insertion and removal. - esophagogastroduodenoscopy. - TEE without cardioversion. - colonoscopy. - left and right heart catheterization. - abdominal angiogram. - thyroidectomy. - abdominal hysterectomy. - ankle fracture surgery. Medical A Surgical History Notes nd Ear/Nose/Mouth/Throat edentulous, GERD, seasonal allergies, trouble swallowing Hematologic/Lymphatic hyperlipidemia Cardiovascular complete heart  block, bradycardia, paroxysmal atrial fibrillation, pacemaker Gastrointestinal hemorrhoids Endocrine hypothyroidism Genitourinary proteinuria, frequent UTIs Musculoskeletal decreased ROM L shoulder, hemiparesis affecting L side, arthritis Neurologic benign paroxysmal positional vertigo, intracranial vascular stenosis, lacunar infarction, stroke Oncologic thyroid cancer Psychiatric depression Objective Constitutional Hypertensive, asymptomatic. No acute distress.. Vitals Time Taken: 3:36 PM, Height: 67 in, Weight: 243 lbs, BMI: 38.1, Temperature: 98.4 F, Pulse: 70 bpm, Respiratory Rate: 18 breaths/min, Blood Pressure: 151/69 mmHg. Respiratory Normal work of breathing on room air.. General Notes: 10/11/2022: The wound on her left great toe is half the size as it was last week. There is light slough and some periwound eschar. No concern for infection. Integumentary (Hair, Skin) Wound #13 status is Open. Original cause of wound was Footwear Injury. The date acquired was: 09/06/2022. The wound has been in treatment 2 weeks. The wound is located on the Left T Great. The wound measures 0.5cm length x 0.4cm width x 0.1cm depth; 0.157cm^2 area and 0.016cm^3 volume. There is Fat oe Layer (Subcutaneous Tissue) exposed. There is no tunneling or undermining noted. There is a medium amount of serosanguineous drainage noted. There is medium (34-66%) red granulation within the wound bed. There is a medium (34-66%) amount of necrotic tissue within the wound bed including Eschar and Adherent Slough. The periwound skin appearance exhibited: Dry/Scaly, Maceration. 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 There was a Selective/Open Wound Non-Viable Tissue Debridement with a total area of 0.2 sq cm performed by Fredirick Maudlin, MD. With the following instrument(s): Curette to remove Non-Viable tissue/material. Material removed includes Eschar and Slough and after achieving pain control using Lidocaine 5% topical ointment. No specimens were taken. A time out was conducted at 15:50, prior to the start of the procedure. A Minimum amount of bleeding was controlled with Pressure. The procedure was tolerated well with a pain level of 0 throughout and a pain level of 0 following the procedure. Post Debridement Measurements: 0.5cm length x 0.4cm width x 0.1cm depth; 0.016cm^3 volume. Character of Wound/Ulcer Post Debridement is improved. Severity of Tissue Post Debridement is: Fat layer exposed. Post procedure Diagnosis Wound #13: Same as Pre-Procedure General Notes: Scribed by J.Scotton for Dr. Celine Ahr. Plan 10/11/2022: The wound on her left great toe is half the size as it was last week. There is light slough and some periwound eschar. No concern for infection. I used a curette to debride slough and eschar from her wound. We will continue to use silver alginate with a foam doughnut. Follow-up in 2 weeks. Electronic Signature(s) Signed: 10/11/2022 4:00:08 PM By: Fredirick Maudlin MD FACS Entered By: Fredirick Maudlin on 10/11/2022 16:00:08 -------------------------------------------------------------------------------- HxROS Details Patient Name: Date of Service: [place], [name] 10/11/2022 3:15 PM Medical Record Number: [id number] Patient Account Number:  [id number] Date of Birth/Sex: Treating RN: 01-15-1951 (71 y.o. F) Primary Care Provider: Jim Like Other Clinician: Referring Provider: Treating Provider/Extender: Rory Percy in Treatment: 27 Information Obtained From Patient Eyes Medical History: Positive for: Cataracts Ear/Nose/Mouth/Throat Medical History: Past Medical History Notes: edentulous, GERD, seasonal allergies, trouble swallowing Hematologic/Lymphatic Medical History: Positive for: Anemia Past Medical History Notes: hyperlipidemia Respiratory Medical History: Positive for: Sleep Apnea [place], [name] ([id number]) [id number] Page 9 of 10 Cardiovascular Medical History: Positive for: Congestive Heart Failure; Hypertension Past Medical History Notes: complete heart block, bradycardia, paroxysmal atrial fibrillation, pacemaker Gastrointestinal Medical History: Past Medical History Notes: hemorrhoids Endocrine Medical History: Positive for: Type I Diabetes Past Medical History Notes: hypothyroidism Time with diabetes: 11 yrs Treated with: Insulin Blood sugar tested every day: Yes Tested : on pump Genitourinary Medical History: Past Medical History Notes: proteinuria, frequent UTIs Musculoskeletal Medical History: Past Medical History Notes: decreased ROM L shoulder, hemiparesis affecting L side, arthritis Neurologic Medical History: Past Medical History Notes: benign paroxysmal positional vertigo, intracranial vascular stenosis, lacunar infarction, stroke Oncologic Medical History: Positive for: Received Radiation Past Medical History Notes: thyroid cancer Psychiatric Medical History: Past Medical History Notes: depression HBO Extended History Items Eyes: Cataracts Immunizations Pneumococcal Vaccine: Received Pneumococcal Vaccination: Yes Received Pneumococcal Vaccination On or After 60th Birthday: Yes Implantable  Devices Yes Hospitalization / Surgery History Type of Hospitalization/Surgery pacemaker implant loop recorder insertion and removal esophagogastroduodenoscopy TEE without cardioversion colonoscopy left and right heart catheterization abdominal angiogram thyroidectomy Mavity, [name] ([id number]) [id number] Page 10 of 10 abdominal hysterectomy ankle fracture surgery Family and Social History Heart Disease: Yes - Mother; Hypertension: Yes - Mother,Father; Stroke: Yes - Child; Never smoker; Marital Status - Divorced; Alcohol Use: Never; Drug Use: No History; Caffeine Use: Never; Financial Concerns: No; Food, Clothing or Shelter Needs: No; Support System Lacking: No; Transportation Concerns: No Electronic Signature(s) Signed: 10/11/2022 4:40:20 PM By: Fredirick Maudlin MD FACS Entered By: Fredirick Maudlin on 10/11/2022 15:56:14 -------------------------------------------------------------------------------- SuperBill Details Patient  Name: Date of Service: [place], [place] 10/11/2022 Medical Record Number: [id number] Patient Account Number: [id number] Date of Birth/Sex: Treating RN: 08-May-1951 (71 y.o. 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 X65.53 Type 1 diabetes mellitus with diabetic polyneuropathy Z68.41 Body mass index [BMI]40.0-44.9, adult E10.622 Type 1 diabetes mellitus with other skin ulcer L97.522 Non-pressure chronic ulcer of other part of left foot with fat layer exposed Facility Procedures : CPT4 Code: [id number] Description: 67544 - DEBRIDE WOUND 1ST 20 SQ CM OR < ICD-10 Diagnosis Description L97.522  Non-pressure chronic ulcer of other part of left foot with fat layer exposed Modifier: Quantity: 1 Physician Procedures : CPT4 Code Description Modifier [id number] 12197 - WC PHYS LEVEL 3 - EST PT 25 ICD-10 Diagnosis Description L97.522 Non-pressure chronic ulcer of other part of left foot with fat layer exposed J88.32 Chronic diastolic (congestive) heart failure I89.0  Lymphedema, not elsewhere classified E10.622 Type 1 diabetes mellitus with other skin ulcer Quantity: 1 : [id number] 15830 - WC PHYS DEBR WO ANESTH 20 SQ CM ICD-10 Diagnosis Description L97.522 Non-pressure chronic ulcer of other part of left foot with fat layer exposed Quantity: 1 Electronic Signature(s) Signed: 10/11/2022 4:00:35 PM By: Fredirick Maudlin MD FACS Entered By: Fredirick Maudlin on 10/11/2022 16:00:35

## 2022-10-11 NOTE — Progress Notes (Signed)
[name] ([id number]) [id number] Page 1 of 7 Visit Report for 10/11/2022 Arrival Information Details Patient Name: Date of Service: [name]. 10/11/2022 3:15 PM Medical Record Number: [id number] Patient Account Number: [id number] Date of Birth/Sex: Treating RN: June 09, 1951 (71 y.o. [name] Primary Care Chanta Bauers: Jim Like Other Clinician: Referring Marilena Trevathan: Treating Kwesi Sangha/Extender: Rory Percy in Treatment: 27 Visit Information History Since Last Visit Added or deleted any medications: No Patient Arrived: Wheel Chair Any new allergies or adverse reactions: No Arrival Time: 15:32 Had a fall or experienced change in No Accompanied By: self activities of daily living that may affect Transfer Assistance: None risk of falls: Patient Identification Verified: Yes Signs or symptoms of abuse/neglect since last visito No Patient Requires Transmission-Based Precautions: No Hospitalized since last visit: No Patient Has Alerts: Yes Implantable device outside of the clinic excluding No Patient Alerts: Patient on Blood Thinner cellular tissue based products placed in the center pacemaker since last visit: Has Dressing in Place as Prescribed: Yes Pain Present Now: No Electronic Signature(s) Signed: 10/11/2022 5:11:49 PM By: Dellie Catholic RN Entered By: Dellie Catholic on 10/11/2022 15:32:33 -------------------------------------------------------------------------------- Encounter Discharge Information Details Patient Name: Date of Service: [name] Lords, [name] 10/11/2022 3:15 PM Medical Record Number: [id number] Patient Account Number: [id number] Date of Birth/Sex: Treating RN: 03/31/1951 (71 y.o. [name] Primary Care Lidia Clavijo: Jim Like Other Clinician: Referring Amberrose Friebel: Treating Lorain Fettes/Extender: Rory Percy in Treatment: 27 Encounter Discharge  Information Items Post Procedure Vitals Discharge Condition: Stable Temperature (F): 98.4 Ambulatory Status: Wheelchair Pulse (bpm): 70 Discharge Destination: Home Respiratory Rate (breaths/min): 18 Transportation: Other Blood Pressure (mmHg): 151/69 Accompanied By: self Schedule Follow-up Appointment: Yes Clinical Summary of Care: Patient Declined Electronic Signature(s) Signed: 10/11/2022 5:11:49 PM By: Dellie Catholic RN Entered By: Dellie Catholic on 10/11/2022 17:11:23 [name] ([id number]) [id number] Page 2 of 7 -------------------------------------------------------------------------------- Lower Extremity Assessment Details Patient Name: Date of Service: [name], [name] 10/11/2022 3:15 PM Medical Record Number: [id number] Patient Account Number: [id number] Date of Birth/Sex: Treating RN: 06-12-1951 (71 y.o. [name] Primary Care Duel Conrad: Jim Like Other Clinician: Referring Vashawn Ekstein: Treating Dunia Pringle/Extender: Rory Percy in Treatment: 27 Edema Assessment Assessed: [Left: No] [Right: No] [Left: Edema] [Right: :] Calf Left: Right: Point of Measurement: From Medial Instep 40.5 cm Ankle Left: Right: Point of Measurement: From Medial Instep 26 cm Vascular Assessment Pulses: Dorsalis Pedis Palpable: [Left:Yes] [Right:Yes] Electronic Signature(s) Signed: 10/11/2022 5:11:49 PM By: Dellie Catholic RN Entered By: Dellie Catholic on 10/11/2022 15:37:09 -------------------------------------------------------------------------------- Multi Wound Chart Details Patient Name: Date of Service: [name] Salvo [name] 10/11/2022 3:15 PM Medical Record Number: [id number] Patient Account Number: [id number] Date of Birth/Sex: Treating RN: Sep 01, 1951 (71 y.o. F) Primary Care Rhianon Zabawa: Jim Like Other Clinician: Referring Quincie Haroon: Treating Densel Kronick/Extender: Rory Percy in Treatment: 27 Vital Signs Height(in): 52 Pulse(bpm): 36 Weight(lbs): 243 Blood Pressure(mmHg): 151/69 Body Mass Index(BMI): 38.1 Temperature(F): 98.4 Respiratory Rate(breaths/min): 18 [13:Photos:] [N/A:N/A] Left T Great oe N/A N/A Wound Location: Footwear Injury N/A N/A Wounding Event: Diabetic Wound/Ulcer of the Lower N/A N/A Primary Etiology: Extremity Cataracts, Anemia, Sleep Apnea, N/A N/A Comorbid History: Congestive Heart Failure, Hypertension, Type I Diabetes, Received Radiation 09/06/2022 N/A N/A Date Acquired: 2 N/A N/A Weeks of Treatment: Open N/A N/A Wound Status: No N/A N/A Wound Recurrence: 0.5x0.4x0.1 N/A N/A Measurements L x W x D (cm) 0.157 N/A N/A A (cm) : rea 0.016 N/A  N/A Volume (cm) : 75.00% N/A N/A % Reduction in A rea: 74.60% N/A N/A % Reduction in Volume: Grade 1 N/A N/A Classification: Medium N/A N/A Exudate A mount: Serosanguineous N/A N/A Exudate Type: red, brown N/A N/A Exudate Color: Medium (34-66%) N/A N/A Granulation A mount: Red N/A N/A Granulation Quality: Medium (34-66%) N/A N/A Necrotic A mount: Eschar, Adherent Slough N/A N/A Necrotic Tissue: Fat Layer (Subcutaneous Tissue): Yes N/A N/A Exposed Structures: Fascia: No Tendon: No Muscle: No Joint: No Bone: No Maceration: Yes N/A N/A Periwound Skin Moisture: Dry/Scaly: Yes Treatment Notes Electronic Signature(s) Signed: 10/11/2022 3:55:18 PM By: Fredirick Maudlin MD FACS Entered By: Fredirick Maudlin on 10/11/2022 15:55:18 -------------------------------------------------------------------------------- Multi-Disciplinary Care Plan Details Patient Name: Date of Service: [name] Lords, RO [place] [name] 10/11/2022 3:15 PM Medical Record Number: [id number] Patient Account Number: [id number] Date of Birth/Sex: Treating RN: 29-Jul-1951 (71 y.o. [name] Primary Care Paolo Okane: Jim Like Other Clinician: Referring Savannah Morford: Treating  Micaila Ziemba/Extender: Rory Percy in Treatment: [place] reviewed with physician Active Inactive Abuse / Safety / Falls / Self Care Management Nursing Diagnoses: History of Falls Impaired physical mobility Goals: Patient/caregiver will verbalize/demonstrate measures taken to prevent injury and/or falls Date Initiated: 04/03/2022 Target Resolution Date: 10/20/2022 Goal Status: Active Interventions: Assess fall risk on admission and as needed Assess: immobility, friction, shearing, incontinence upon admission and as needed Assess impairment of mobility on admission and as needed per policy Steinmetz, [name] Estrella [name] ([id number]) [id number] Page 4 of 7 Notes: Nutrition Nursing Diagnoses: Impaired glucose control: actual or potential Potential for alteratiion in Nutrition/Potential for imbalanced nutrition Goals: Patient/caregiver will maintain therapeutic glucose control Date Initiated: 04/03/2022 Target Resolution Date: 10/20/2022 Goal Status: Active Interventions: Assess HgA1c results as ordered upon admission and as needed Provide education on elevated blood sugars and impact on wound healing Treatment Activities: Patient referred to Primary Care Physician for further nutritional evaluation : 04/03/2022 Notes: Venous Leg Ulcer Nursing Diagnoses: Knowledge deficit related to disease process and management Potential for venous Insuffiency (use before diagnosis confirmed) Goals: Patient will maintain optimal edema control Date Initiated: 04/03/2022 Target Resolution Date: 10/20/2022 Goal Status: Active Interventions: Assess peripheral edema status every visit. Compression as ordered Provide education on venous insufficiency Treatment Activities: Therapeutic compression applied : 04/03/2022 Notes: Electronic Signature(s) Signed: 10/11/2022 5:11:49 PM By: Dellie Catholic RN Entered By: Dellie Catholic on  10/11/2022 17:08:42 -------------------------------------------------------------------------------- Pain Assessment Details Patient Name: Date of Service: Greta Doom M. 10/11/2022 3:15 PM Medical Record Number: [id number] Patient Account Number: [id number] Date of Birth/Sex: Treating RN: 01/25/1951 (71 y.o. [name] Primary Care Khristen Cheyney: Jim Like Other Clinician: Referring Stanly Si: Treating Shirely Toren/Extender: Rory Percy in Treatment: 27 Active Problems Location of Pain Severity and Description of Pain Patient Has Paino No Site Locations Leonhard, [name] [place] M ([id number]) [id number] Page 5 of 7 Pain Management and Medication Current Pain Management: Electronic Signature(s) Signed: 10/11/2022 5:11:49 PM By: Dellie Catholic RN Entered By: Dellie Catholic on 10/11/2022 15:37:02 -------------------------------------------------------------------------------- Patient/Caregiver Education Details Patient Name: Date of Service: Jacqulyn Ducking 10/18/2023andnbsp3:15 PM Medical Record Number: [id number] Patient Account Number: [id number] Date of Birth/Gender: Treating RN: [birth year] (71 y.o. [name] Primary Care Physician: Jim Like Other Clinician: Referring Physician: Treating Physician/Extender: Rory Percy in Treatment: 27 Education Assessment Education Provided To: Patient Education Topics Provided Wound/Skin Impairment: Methods: Explain/Verbal Responses: Reinforcements needed Electronic Signature(s) Signed: 10/11/2022 5:11:49 PM By: Dellie Catholic RN Entered By: Dellie Catholic on 10/11/2022  17:08:58 -------------------------------------------------------------------------------- Wound Assessment Details Patient Name: Date of Service: MASYN, FULLAM. 10/11/2022 3:15 PM Medical Record Number: [id number] Patient Account Number: [id number] Date of  Birth/Sex: Treating RN: [birth year] (71 y.o. [name] Primary Care Mads Borgmeyer: Jim Like Other Clinician: Referring Marlowe Lawes: Treating Theadore Blunck/Extender: Linus Mako, [name] Estrella [name] ([id number]) [id number] Page 6 of 7 Weeks in Treatment: 27 Wound Status Wound Number: 13 Primary Diabetic Wound/Ulcer of the Lower Extremity Etiology: Wound Location: Left T Great oe Wound Open Wounding Event: Footwear Injury Status: Date Acquired: 09/06/2022 Comorbid Cataracts, Anemia, Sleep Apnea, Congestive Heart Failure, Weeks Of Treatment: 2 History: Hypertension, Type I Diabetes, Received Radiation Clustered Wound: No Photos Wound Measurements Length: (cm) 0.5 Width: (cm) 0.4 Depth: (cm) 0.1 Area: (cm) 0.157 Volume: (cm) 0.016 % Reduction in Area: 75% % Reduction in Volume: 74.6% Tunneling: No Undermining: No Wound Description Classification: Grade 1 Exudate Amount: Medium Exudate Type: Serosanguineous Exudate Color: red, brown Foul Odor After Cleansing: No Slough/Fibrino Yes Wound Bed Granulation Amount: Medium (34-66%) Exposed Structure Granulation Quality: Red Fascia Exposed: No Necrotic Amount: Medium (34-66%) Fat Layer (Subcutaneous Tissue) Exposed: Yes Necrotic Quality: Eschar, Adherent Slough Tendon Exposed: No Muscle Exposed: No Joint Exposed: No Bone Exposed: No Periwound Skin Texture Texture Color No Abnormalities Noted: No No Abnormalities Noted: No Moisture No Abnormalities Noted: No Dry / Scaly: Yes Maceration: Yes Treatment Notes Wound #13 (Toe Great) Wound Laterality: Left Cleanser Soap and Water Discharge Instruction: May shower and wash wound with dial antibacterial soap and water prior to dressing change. Peri-Wound Care Sween Lotion (Moisturizing lotion) Discharge Instruction: Apply moisturizing lotion as directed Topical Primary Dressing KerraCel Ag Gelling Fiber Dressing, 2x2 in  (silver alginate) Discharge Instruction: Apply silver alginate to wound bed as instructed Secondary Dressing [place], [name] Estrella [name] ([id number]) [id number] Page 7 of 7 ALLEVYN Gentle Border, 4x4 (in/in) Discharge Instruction: Apply over primary dressing as directed. Secured With [employer], 4.5x3.1 (in/yd) Discharge Instruction: Secure with Kerlix as directed. Transpore Surgical Tape, 2x10 (in/yd) Discharge Instruction: Secure dressing with tape as directed. Compression Wrap Compression Stockings Add-Ons Electronic Signature(s) Signed: 10/11/2022 5:11:49 PM By: Dellie Catholic RN Entered By: Dellie Catholic on 10/11/2022 15:41:48 -------------------------------------------------------------------------------- Vitals Details Patient Name: Date of Service: [name] Lords, [name] 10/11/2022 3:15 PM Medical Record Number: [id number] Patient Account Number: [id number] Date of Birth/Sex: Treating RN: [birth year] (71 y.o. [name] Primary Care Mansa Willers: Jim Like Other Clinician: Referring Kecia Swoboda: Treating Charnel Giles/Extender: Rory Percy in Treatment: 27 Vital Signs Time Taken: 15:36 Temperature (F): 98.4 Height (in): 67 Pulse (bpm): 70 Weight (lbs): 243 Respiratory Rate (breaths/min): 18 Body Mass Index (BMI): 38.1 Blood Pressure (mmHg): 151/69 Reference Range: 80 - 120 mg / dl Electronic Signature(s) Signed: 10/11/2022 5:11:49 PM By: Dellie Catholic RN Entered By: Dellie Catholic on 10/11/2022 15:42:18

## 2022-10-13 ENCOUNTER — Encounter: Payer: Self-pay | Admitting: Pulmonary Disease

## 2022-10-16 NOTE — Progress Notes (Signed)
Remote pacemaker transmission.   

## 2022-10-17 ENCOUNTER — Telehealth: Payer: Self-pay

## 2022-10-17 DIAGNOSIS — J9611 Chronic respiratory failure with hypoxia: Secondary | ICD-10-CM

## 2022-10-17 NOTE — Telephone Encounter (Signed)
Patient calls nurse line requesting smaller portable oxygen concentrator. She states that she has spoken with [employer] and that we will need to place a new order.   Community message sent to [employer] to determine what is needed from our office.   *Patient has also spoken with pulmonologist, she states that she was told to contact PCP, as we ordered the POC.   Talbot Grumbling, RN

## 2022-10-18 NOTE — Telephone Encounter (Signed)
Ok with me 

## 2022-10-20 DIAGNOSIS — I480 Paroxysmal atrial fibrillation: Secondary | ICD-10-CM | POA: Diagnosis not present

## 2022-10-20 DIAGNOSIS — D649 Anemia, unspecified: Secondary | ICD-10-CM | POA: Diagnosis not present

## 2022-10-20 DIAGNOSIS — N189 Chronic kidney disease, unspecified: Secondary | ICD-10-CM | POA: Diagnosis not present

## 2022-10-20 DIAGNOSIS — E11621 Type 2 diabetes mellitus with foot ulcer: Secondary | ICD-10-CM | POA: Diagnosis not present

## 2022-10-20 NOTE — Telephone Encounter (Signed)
Community message sent to [employer]. Will await response.   China Deitrick C Sanjana Folz, RN  

## 2022-10-20 NOTE — Telephone Encounter (Signed)
Patient is calling back to check on the status of the order for her oxygen being placed. She said she needs it asap and she does not see pulmonologist for a couple weeks.   Please call patient and let her know when this has been completed. The best call back number is [phone].

## 2022-10-20 NOTE — Telephone Encounter (Signed)
Yes. It appears that new order will need to be placed for POC. This will need to include the liter flow and if you want a specific machine.   Thanks.

## 2022-10-20 NOTE — Addendum Note (Signed)
Addended by: Jim Like B on: 10/20/2022 11:49 AM   Modules accepted: Orders

## 2022-10-21 ENCOUNTER — Other Ambulatory Visit: Payer: Self-pay | Admitting: Family Medicine

## 2022-10-21 ENCOUNTER — Other Ambulatory Visit: Payer: Self-pay | Admitting: Student

## 2022-10-21 DIAGNOSIS — E1069 Type 1 diabetes mellitus with other specified complication: Secondary | ICD-10-CM

## 2022-10-23 NOTE — Telephone Encounter (Signed)
Requested medication (s) are due for refill today: yes  Requested medication (s) are on the active medication list: yes  Last refill:  11/16/21  Future visit scheduled: yes  Notes to clinic:  Unable to refill per protocol, Rx not assigned to protocol, routing for review      Requested Prescriptions  Pending Prescriptions Disp Refills   atorvastatin (LIPITOR) 80 MG tablet [Pharmacy Med Name: ATORVASTATIN '80MG'$  TABLETS] 90 tablet 1    Sig: TAKE 1 TABLET(80 MG) BY MOUTH EVERY EVENING     There is no refill protocol information for this order

## 2022-10-25 ENCOUNTER — Encounter (HOSPITAL_BASED_OUTPATIENT_CLINIC_OR_DEPARTMENT_OTHER): Payer: Medicare PPO | Attending: General Surgery | Admitting: General Surgery

## 2022-10-25 DIAGNOSIS — I5032 Chronic diastolic (congestive) heart failure: Secondary | ICD-10-CM | POA: Insufficient documentation

## 2022-10-25 DIAGNOSIS — I110 Hypertensive heart disease with heart failure: Secondary | ICD-10-CM | POA: Diagnosis not present

## 2022-10-25 DIAGNOSIS — Z8673 Personal history of transient ischemic attack (TIA), and cerebral infarction without residual deficits: Secondary | ICD-10-CM | POA: Diagnosis not present

## 2022-10-25 DIAGNOSIS — Z8249 Family history of ischemic heart disease and other diseases of the circulatory system: Secondary | ICD-10-CM | POA: Insufficient documentation

## 2022-10-25 DIAGNOSIS — E10621 Type 1 diabetes mellitus with foot ulcer: Secondary | ICD-10-CM | POA: Diagnosis not present

## 2022-10-25 DIAGNOSIS — Z9981 Dependence on supplemental oxygen: Secondary | ICD-10-CM | POA: Diagnosis not present

## 2022-10-25 DIAGNOSIS — I890 Lymphedema, not elsewhere classified: Secondary | ICD-10-CM | POA: Diagnosis not present

## 2022-10-25 DIAGNOSIS — E1042 Type 1 diabetes mellitus with diabetic polyneuropathy: Secondary | ICD-10-CM | POA: Diagnosis not present

## 2022-10-25 DIAGNOSIS — L97522 Non-pressure chronic ulcer of other part of left foot with fat layer exposed: Secondary | ICD-10-CM | POA: Insufficient documentation

## 2022-10-26 NOTE — Progress Notes (Signed)
Tubbs, [name] ([id number]) [id number] Page 1 of 11 Visit Report for 10/25/2022 Chief Complaint Document Details Patient Name: Date of Service: [name], [name]. 10/25/2022 3:15 PM Medical Record Number: [id number] Patient Account Number: [id number] Date of Birth/Sex: Treating RN: 08-Jun-1951 (71 y.o. [name] Primary Care Provider: Jim [name] Other Clinician: Referring Provider: Treating Provider/Extender: [name] in Treatment: 29 Information Obtained from: Patient Chief Complaint Patient presents for treatment of open ulcers due to venous insufficiency in the setting of poorly controlled DM1 and CHF Electronic Signature(s) Signed: 10/25/2022 4:24:45 PM By: [name] Maudlin MD FACS Entered By: [name] on 10/25/2022 16:24:45 -------------------------------------------------------------------------------- Debridement Details Patient Name: Date of Service: [name], [name] 10/25/2022 3:15 PM Medical Record Number: [id number] Patient Account Number: [id number] Date of Birth/Sex: Treating RN: 17-Sep-1951 (71 y.o. [name] Primary Care Provider: Jim [name] Other Clinician: Referring Provider: Treating Provider/Extender: [name] in Treatment: 29 Debridement Performed for Assessment: Wound #13 Left T Great oe Performed By: Physician [name] Maudlin, MD Debridement Type: Debridement Severity of Tissue Pre Debridement: Fat layer exposed Level of Consciousness (Pre-procedure): Awake and Alert Pre-procedure Verification/Time Out Yes - 15:45 Taken: Start Time: 15:45 Pain Control: Lidocaine 4% T opical Solution T Area Debrided (L x W): otal 0.2 (cm) x 0.2 (cm) = 0.04 (cm) Tissue and other material debrided: Non-Viable, Eschar, Slough, Slough Level: Non-Viable Tissue Debridement Description: Selective/Open Wound Instrument: Curette Bleeding:  Minimum Hemostasis Achieved: Pressure End Time: 15:46 Procedural Pain: 0 Post Procedural Pain: 0 Response to Treatment: Procedure was tolerated well Level of Consciousness (Post- Awake and Alert procedure): Post Debridement Measurements of Total Wound Length: (cm) 0.2 Width: (cm) 0.2 Depth: (cm) 0.1 Volume: (cm) 0.003 Character of Wound/Ulcer Post Debridement: Improved Severity of Tissue Post Debridement: Fat layer exposed [name], [name] ([id number]) [id number] Page 2 of 11 Post Procedure Diagnosis Same as Pre-procedure Notes Scribed for Dr. Celine [name] by [name] Electronic Signature(s) Signed: 10/25/2022 4:32:53 PM By: [name] Maudlin MD FACS Signed: 10/26/2022 9:02:06 AM By: [name] Catholic RN Entered By: [name] on 10/25/2022 15:47:52 -------------------------------------------------------------------------------- HPI Details Patient Name: Date of Service: [name], [name] 10/25/2022 3:15 PM Medical Record Number: [id number] Patient Account Number: [id number] Date of Birth/Sex: Treating RN: [birth year] (71 y.o. [name] Primary Care Provider: Jim [name] Other Clinician: Referring Provider: Treating Provider/Extender: [name] in Treatment: 29 History of Present Illness HPI Description: ADMISSION 04/03/2022 This is a 71 year old woman with a past medical history notable for type 1 diabetes mellitus, poorly controlled with her most recent A1c being 8.3, congestive heart failure with recent admission for exacerbation secondary to noncompliance with diuretics, as well as history of pontine stroke and oxygen dependence. She has wounds on her bilateral lower extremities. She says they have been present for about 2 months and initially started as blisters. She says that she has compression stockings, but does not wear them because they are too difficult to get on. ABIs done in clinic were 1.4 and  1.5, suggesting some degree of noncompressibility. She also has a wound on her right heel that she says is secondary to it getting bumped frequently as she transfers. She came to clinic with just dry dressings on her legs. She does have home health care, as well as her sister that have been assisting her with her dressings. She has multiple wounds. On the left medial calf, just above her ankle, there is a somewhat geographic  wound with slough and some epithelialization. On the anterior tibial surface and lateral calf, the wounds are quite desiccated with yellow eschar present. On the right anterior tibial surface, the wounds are gritty with thin slough and eschar. The posterior right ankle wound is just above the calcaneus overlying the Achilles and has thick yellow-gray slough and a bit of odor. No purulent drainage appreciated. 04/10/2022: The wounds on her left leg are closed. On the right, the anterior tibial surface wounds are epithelializing and much cleaner. There is just a layer of biofilm on the open areas. On the posterior right ankle wound, there is a decreased volume of slough and no odor. We have been using Hydrofera Blue to all sites under compression. 04/17/2022: The wounds on her left leg remained closed and she did bring her juxta lite stockings with her today. On the right, the more distal wound is closed and the proximal wound has epithelialized significantly since her last visit. At the posterior right ankle, she still has accumulated some slough and some periwound callus but there is no odor or significant drainage. 04/24/2022: The right anterior leg wounds are closed. She still has a wound on her posterior right ankle that looks about the same as last week. She did not bring her right sided juxta light with her today but is wearing the left sided stocking. 05/01/2022: She has a tiny abrasion on her left anterior tibia which we did not add to her wound count today. The wound on her  posterior right ankle is a little bit smaller with a little accumulated slough. No drainage or concern for infection. 05/08/2022: She continues to forget to bring her juxta lite stocking with her so she remains in a compression wrap despite healing of the right anterior leg wounds. The wound on her posterior right ankle/calcaneus is a bit smaller today with just a little bit of accumulated slough. She is wearing a juxta lite stocking on the left leg. 05/15/2022: She says that she has been wearing her juxta lite stockings at home, but today she is wearing regular compression stockings that she purchased from [employer]; the juxta lite stockings are in the laundry. The wound on her posterior right ankle/calcaneus continues to contract and has just a little bit of accumulated slough. Unfortunately, her edema control is suboptimal on the left and she has developed several small blisters with a larger 1 at the medial calf. 05/23/2022: The patient has been wearing her juxta lite stockings. Unfortunately, she has developed a large blister on her right lower leg just above the ankle. The skin overlying it is still intact. The blister that had opened on her left medial calf is smaller; and the other small blistered sites are closed. The posterior right ankle/calcaneus is about the same today. 06/07/2022: The only remaining open wound is on her posterior right calcaneus. The majority of this is covered with eschar. 06/30/2022: I am not sure why the patient did not return to clinic until today, but the ulcer on her right heel has healed. Unfortunately, she has developed a new ulcer on her left lateral foot. From the position of it, it looks as though the outward rotation of her lower leg at rest is probably causing the lateral midfoot to rub or press against the metal leg rests of her wheelchair. She is not padding the chair when she rests her leg on the leg rests. The new ulcer is fairly superficial with just the fat  layer exposed. It is quite  tender. No erythema, induration, or purulent drainage identified. 07/12/2022: After our last visit, she was seen by podiatry. Nail care was performed. For some reason he had her apply silicone toe covers on her bilateral great toes. As result, the skin is now very macerated and she has a new ulcer on the knuckle of her right great toe. It is fairly small but does expose the fat layer. No erythema induration or purulent drainage although there was some odor on her feet when the caps were removed. The wound on her left lateral foot is smaller with just a small amount of slough buildup. Her Prevalon boots arrived and we will put her in those after her dressing is changed. Dellarocco, [name] ([id number]) [id number] Page 3 of 11 08/02/2022: She has been wearing her Prevalon boots to some extent, but says that she spends most of her day in her wheelchair and the boots slide off the foot rest. She apparently has not been utilizing the leg strap. The left lateral foot wound is tender and the tissue has some discoloration suggestive of ongoing pressure in this area. There is a little bit of callus accumulation circumferentially. The wound on her right great toe DIP joint is nearly closed and just has a light layer of eschar overlying the surface. No concern for infection. 08/16/2022: The wound on her right great toe has healed. The wound on her left lateral foot is quite a bit smaller with good perimeter epithelialization and a light layer of slough on the surface. 08/30/2022: Today she is complaining of a lot more pain on her left lateral foot. I think we are struggling with adequate padding and prevention of pressure in the site. The wound opening itself is quite small, but there is circumferential undermining from old skin. There is no odor or purulent drainage. 09/13/2022: The lateral foot wound looks significantly improved today. It is smaller and I no  longer detect the undermining. 09/27/2022: The lateral foot wound has healed. Unfortunately, she has developed a new wound that appears to be secondary to friction on her left great toe dorsal knuckle. There is a little bit of slough present but no concern for infection. 10/11/2022: The wound on her left great toe is half the size as it was last week. There is light slough and some periwound eschar. No concern for infection. 10/25/2022: The wound on her left great toe is almost completely healed; there is just a tiny open area under some callus and eschar. Electronic Signature(s) Signed: 10/25/2022 4:25:13 PM By: [name] Maudlin MD FACS Entered By: [name] on 10/25/2022 16:25:13 -------------------------------------------------------------------------------- Physical Exam Details Patient Name: Date of Service: Mcglinn, [name] 10/25/2022 3:15 PM Medical Record Number: [id number] Patient Account Number: [id number] Date of Birth/Sex: Treating RN: 05/04/1951 (71 y.o. [name] Primary Care Provider: Jim [name] Other Clinician: Referring Provider: Treating Provider/Extender: [name] in Treatment: 29 Constitutional Hypertensive, asymptomatic. . . . No acute distress.Marland Kitchen Respiratory Normal work of breathing on room air.. Notes 10/25/2022: The wound on her left great toe is almost completely healed; there is just a tiny open area under some callus and eschar. Electronic Signature(s) Signed: 10/25/2022 4:25:44 PM By: [name] Maudlin MD FACS Entered By: [name] on 10/25/2022 16:25:44 -------------------------------------------------------------------------------- Physician Orders Details Patient Name: Date of Service: Tyminski, [name] 10/25/2022 3:15 PM Medical Record Number: [id number] Patient Account Number: [id number] Date of Birth/Sex: Treating RN: 1951/04/15 (71 y.o. [name] Primary Care Provider:  Jim [name]  Other Clinician: Referring Provider: Treating Provider/Extender: [name] in Treatment: 29 Verbal / Phone Orders: No Diagnosis Coding ICD-10 Coding Code Description Pugsley, [name] ([id number]) [id number] Page 4 of 11 I89.0 Lymphedema, not elsewhere classified J82.50 Chronic diastolic (congestive) heart failure E10.59 Type 1 diabetes mellitus with other circulatory complications N39.76 Type 1 diabetes mellitus with diabetic polyneuropathy Z68.41 Body mass index [BMI]40.0-44.9, adult E10.622 Type 1 diabetes mellitus with other skin ulcer L97.522 Non-pressure chronic ulcer of other part of left foot with fat layer exposed Follow-up Appointments ppointment in 2 weeks. - Dr. Celine [name] - Room 3 Return A Anesthetic Wound #13 Left T Great oe (In clinic) Topical Lidocaine 5% applied to wound bed - in clinic, prior to debridement Bathing/ Shower/ Hygiene May shower and wash wound with soap and water. - with dressing changes Edema Control - Lymphedema / SCD / Other Bilateral Lower Extremities Elevate legs to the level of the heart or above for 30 minutes daily and/or when sitting, a frequency of: - throughout the day Patient to wear own compression stockings every day. - juxtalites to both legs Exercise regularly Off-Loading Other: - Prevalon boot to both feet. Please put pillow between foot and foot rest so the foot is not resting on the metal footrest. Home Health Other Home Health Orders/Instructions: [facility] Wound Treatment Wound #13 - T Great oe Wound Laterality: Left Cleanser: Soap and Water Discharge Instructions: May shower and wash wound with dial antibacterial soap and water prior to dressing change. Peri-Wound Care: Sween Lotion (Moisturizing lotion) Discharge Instructions: Apply moisturizing lotion as directed Prim Dressing: KerraCel Ag Gelling Fiber Dressing, 2x2 in (silver alginate) ary Discharge  Instructions: Apply silver alginate to wound bed as instructed Secondary Dressing: ALLEVYN Gentle Border, 4x4 (in/in) Discharge Instructions: Apply over primary dressing as directed. Secured With: [employer], 4.5x3.1 (in/yd) Discharge Instructions: Secure with Kerlix as directed. Secured With: Transpore Surgical Tape, 2x10 (in/yd) Discharge Instructions: Secure dressing with tape as directed. Electronic Signature(s) Signed: 10/25/2022 4:32:53 PM By: [name] Maudlin MD FACS Entered By: [name] on 10/25/2022 16:25:58 -------------------------------------------------------------------------------- Problem List Details Patient Name: Date of Service: Sliney, [name] 10/25/2022 3:15 PM Medical Record Number: [id number] Patient Account Number: [id number] Date of Birth/Sex: Treating RN: [birth year] (71 y.o. [name] Primary Care Provider: Jim [name] Other Clinician: Referring Provider: Treating Provider/Extender: [name] in Treatment: [address], [name] ([id number]) [id number] Page 5 of 11 Active Problems ICD-10 Encounter Code Description Active Date MDM Diagnosis I89.0 Lymphedema, not elsewhere classified 04/03/2022 No Yes D92.42 Chronic diastolic (congestive) heart failure 04/03/2022 No Yes E10.59 Type 1 diabetes mellitus with other circulatory complications 6/83/4196 No Yes E10.42 Type 1 diabetes mellitus with diabetic polyneuropathy 04/03/2022 No Yes Z68.41 Body mass index [BMI]40.0-44.9, adult 04/03/2022 No Yes E10.622 Type 1 diabetes mellitus with other skin ulcer 04/03/2022 No Yes L97.522 Non-pressure chronic ulcer of other part of left foot with fat layer exposed 07/12/2022 No Yes Inactive Problems ICD-10 Code Description Active Date Inactive Date L97.212 Non-pressure chronic ulcer of right calf with fat layer exposed 04/03/2022 04/03/2022 L97.222 Non-pressure chronic ulcer of left calf  with fat layer exposed 04/03/2022 04/03/2022 L97.312 Non-pressure chronic ulcer of right ankle with fat layer exposed 04/03/2022 04/03/2022 L97.821 Non-pressure chronic ulcer of other part of left lower leg limited to breakdown of skin 05/15/2022 05/15/2022 L97.422 Non-pressure chronic ulcer of left heel and midfoot with fat layer exposed 06/30/2022 06/30/2022 L97.811 Non-pressure chronic ulcer  of other part of right lower leg limited to breakdown of skin 05/23/2022 05/23/2022 Resolved Problems Electronic Signature(s) Signed: 10/25/2022 4:23:27 PM By: [name] Maudlin MD FACS Entered By: [name] on 10/25/2022 16:23:27 -------------------------------------------------------------------------------- Progress Note Details Patient Name: Date of Service: Pritchard, [name] 10/25/2022 3:15 PM Brizzi, [name] ([id number]) [id number] Page 6 of 11 Medical Record Number: [id number] Patient Account Number: [id number] Date of Birth/Sex: Treating RN: Oct 13, 1951 (71 y.o. [name] Primary Care Provider: Jim [name] Other Clinician: Referring Provider: Treating Provider/Extender: [name] in Treatment: 29 Subjective Chief Complaint Information obtained from Patient Patient presents for treatment of open ulcers due to venous insufficiency in the setting of poorly controlled DM1 and CHF History of Present Illness (HPI) ADMISSION 04/03/2022 This is a 71 year old woman with a past medical history notable for type 1 diabetes mellitus, poorly controlled with her most recent A1c being 8.3, congestive heart failure with recent admission for exacerbation secondary to noncompliance with diuretics, as well as history of pontine stroke and oxygen dependence. She has wounds on her bilateral lower extremities. She says they have been present for about 2 months and initially started as blisters. She says that she has compression stockings, but  does not wear them because they are too difficult to get on. ABIs done in clinic were 1.4 and 1.5, suggesting some degree of noncompressibility. She also has a wound on her right heel that she says is secondary to it getting bumped frequently as she transfers. She came to clinic with just dry dressings on her legs. She does have home health care, as well as her sister that have been assisting her with her dressings. She has multiple wounds. On the left medial calf, just above her ankle, there is a somewhat geographic wound with slough and some epithelialization. On the anterior tibial surface and lateral calf, the wounds are quite desiccated with yellow eschar present. On the right anterior tibial surface, the wounds are gritty with thin slough and eschar. The posterior right ankle wound is just above the calcaneus overlying the Achilles and has thick yellow-gray slough and a bit of odor. No purulent drainage appreciated. 04/10/2022: The wounds on her left leg are closed. On the right, the anterior tibial surface wounds are epithelializing and much cleaner. There is just a layer of biofilm on the open areas. On the posterior right ankle wound, there is a decreased volume of slough and no odor. We have been using Hydrofera Blue to all sites under compression. 04/17/2022: The wounds on her left leg remained closed and she did bring her juxta lite stockings with her today. On the right, the more distal wound is closed and the proximal wound has epithelialized significantly since her last visit. At the posterior right ankle, she still has accumulated some slough and some periwound callus but there is no odor or significant drainage. 04/24/2022: The right anterior leg wounds are closed. She still has a wound on her posterior right ankle that looks about the same as last week. She did not bring her right sided juxta light with her today but is wearing the left sided stocking. 05/01/2022: She has a tiny abrasion  on her left anterior tibia which we did not add to her wound count today. The wound on her posterior right ankle is a little bit smaller with a little accumulated slough. No drainage or concern for infection. 05/08/2022: She continues to forget to bring her juxta lite stocking with her so  she remains in a compression wrap despite healing of the right anterior leg wounds. The wound on her posterior right ankle/calcaneus is a bit smaller today with just a little bit of accumulated slough. She is wearing a juxta lite stocking on the left leg. 05/15/2022: She says that she has been wearing her juxta lite stockings at home, but today she is wearing regular compression stockings that she purchased from [employer]; the juxta lite stockings are in the laundry. The wound on her posterior right ankle/calcaneus continues to contract and has just a little bit of accumulated slough. Unfortunately, her edema control is suboptimal on the left and she has developed several small blisters with a larger 1 at the medial calf. 05/23/2022: The patient has been wearing her juxta lite stockings. Unfortunately, she has developed a large blister on her right lower leg just above the ankle. The skin overlying it is still intact. The blister that had opened on her left medial calf is smaller; and the other small blistered sites are closed. The posterior right ankle/calcaneus is about the same today. 06/07/2022: The only remaining open wound is on her posterior right calcaneus. The majority of this is covered with eschar. 06/30/2022: I am not sure why the patient did not return to clinic until today, but the ulcer on her right heel has healed. Unfortunately, she has developed a new ulcer on her left lateral foot. From the position of it, it looks as though the outward rotation of her lower leg at rest is probably causing the lateral midfoot to rub or press against the metal leg rests of her wheelchair. She is not padding the chair when she  rests her leg on the leg rests. The new ulcer is fairly superficial with just the fat layer exposed. It is quite tender. No erythema, induration, or purulent drainage identified. 07/12/2022: After our last visit, she was seen by podiatry. Nail care was performed. For some reason he had her apply silicone toe covers on her bilateral great toes. As result, the skin is now very macerated and she has a new ulcer on the knuckle of her right great toe. It is fairly small but does expose the fat layer. No erythema induration or purulent drainage although there was some odor on her feet when the caps were removed. The wound on her left lateral foot is smaller with just a small amount of slough buildup. Her Prevalon boots arrived and we will put her in those after her dressing is changed. 08/02/2022: She has been wearing her Prevalon boots to some extent, but says that she spends most of her day in her wheelchair and the boots slide off the foot rest. She apparently has not been utilizing the leg strap. The left lateral foot wound is tender and the tissue has some discoloration suggestive of ongoing pressure in this area. There is a little bit of callus accumulation circumferentially. The wound on her right great toe DIP joint is nearly closed and just has a light layer of eschar overlying the surface. No concern for infection. 08/16/2022: The wound on her right great toe has healed. The wound on her left lateral foot is quite a bit smaller with good perimeter epithelialization and a light layer of slough on the surface. 08/30/2022: Today she is complaining of a lot more pain on her left lateral foot. I think we are struggling with adequate padding and prevention of pressure in the site. The wound opening itself is quite small, but there is  circumferential undermining from old skin. There is no odor or purulent drainage. 09/13/2022: The lateral foot wound looks significantly improved today. It is smaller and I no  longer detect the undermining. 09/27/2022: The lateral foot wound has healed. Unfortunately, she has developed a new wound that appears to be secondary to friction on her left great toe dorsal knuckle. There is a little bit of slough present but no concern for infection. 10/11/2022: The wound on her left great toe is half the size as it was last week. There is light slough and some periwound eschar. No concern for infection. 10/25/2022: The wound on her left great toe is almost completely healed; there is just a tiny open area under some callus and eschar. Patient History Information obtained from Patient. Ducey, [name] ([id number]) [id number] Page 7 of 11 Family History Heart Disease - Mother, Hypertension - Mother,Father, Stroke - Child. Social History Never smoker, Marital Status - Divorced, Alcohol Use - Never, Drug Use - No History, Caffeine Use - Never. Medical History Eyes Patient has history of Cataracts Hematologic/Lymphatic Patient has history of Anemia Respiratory Patient has history of Sleep Apnea Cardiovascular Patient has history of Congestive Heart Failure, Hypertension Endocrine Patient has history of Type I Diabetes Oncologic Patient has history of Received Radiation Hospitalization/Surgery History - pacemaker implant. - loop recorder insertion and removal. - esophagogastroduodenoscopy. - TEE without cardioversion. - colonoscopy. - left and right heart catheterization. - abdominal angiogram. - thyroidectomy. - abdominal hysterectomy. - ankle fracture surgery. Medical A Surgical History Notes nd Ear/Nose/Mouth/Throat edentulous, GERD, seasonal allergies, trouble swallowing Hematologic/Lymphatic hyperlipidemia Cardiovascular complete heart block, bradycardia, paroxysmal atrial fibrillation, pacemaker Gastrointestinal hemorrhoids Endocrine hypothyroidism Genitourinary proteinuria, frequent UTIs Musculoskeletal decreased ROM L  shoulder, hemiparesis affecting L side, arthritis Neurologic benign paroxysmal positional vertigo, intracranial vascular stenosis, lacunar infarction, stroke Oncologic thyroid cancer Psychiatric depression Objective Constitutional Hypertensive, asymptomatic. No acute distress.. Vitals Time Taken: 3:35 PM, Height: 67 in, Weight: 243 lbs, BMI: 38.1, Temperature: 97.5 F, Pulse: 70 bpm, Respiratory Rate: 18 breaths/min, Blood Pressure: 159/81 mmHg. Respiratory Normal work of breathing on room air.. General Notes: 10/25/2022: The wound on her left great toe is almost completely healed; there is just a tiny open area under some callus and eschar. Integumentary (Hair, Skin) Wound #13 status is Open. Original cause of wound was Footwear Injury. The date acquired was: 09/06/2022. The wound has been in treatment 4 weeks. The wound is located on the Left T Great. The wound measures 0.2cm length x 0.2cm width x 0.1cm depth; 0.031cm^2 area and 0.003cm^3 volume. There is Fat oe Layer (Subcutaneous Tissue) exposed. There is no tunneling or undermining noted. There is a medium amount of serosanguineous drainage noted. There is large (67-100%) red granulation within the wound bed. There is a small (1-33%) amount of necrotic tissue within the wound bed including Eschar and Adherent Slough. The periwound skin appearance exhibited: Dry/Scaly, Maceration. 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 There was a Selective/Open Wound Non-Viable Tissue  Debridement with a total area of 0.04 sq cm performed by [name] Maudlin, MD. With the following instrument(s): Curette to remove Non-Viable tissue/material. Material removed includes Eschar and Slough and after achieving pain control using Lidocaine 4% T opical Solution. No specimens were taken. A time out was conducted at 15:45, prior to the start of the procedure. A Minimum amount of bleeding was controlled with Pressure. The procedure was tolerated well with a pain level of 0 throughout and a pain level of 0 following the procedure. Post Debridement Measurements: 0.2cm length x 0.2cm width x 0.1cm depth; 0.003cm^3 volume. Character of Wound/Ulcer Post Debridement is improved. Severity of Tissue Post Debridement is: Fat layer exposed. Post procedure Diagnosis Wound #13: Same as Pre-Procedure General Notes: Scribed for Dr. Celine [name] by [name]. Plan Follow-up Appointments: Return Appointment in 2 weeks. - Dr. Celine [name] - Room 3 Anesthetic: Wound #13 Left T Great: oe (In clinic) Topical Lidocaine 5% applied to wound bed - in clinic, prior to debridement Bathing/ Shower/ Hygiene: May shower and wash wound with soap and water. - with dressing changes Edema Control - Lymphedema / SCD / Other: Elevate legs to the level of the heart or above for 30 minutes daily and/or when sitting, a frequency of: - throughout the day Patient to wear own compression stockings every day. - juxtalites to both legs Exercise regularly Off-Loading: Other: - Prevalon boot to both feet. Please put pillow between foot and foot rest so the foot is not resting on the metal footrest. Home Health: Other Home Health Orders/Instructions: Jackqul[facility] [place] WOUND #13: - T Great Wound Laterality: Left oe Cleanser: Soap and Water Discharge Instructions: May  shower and wash wound with dial antibacterial soap and water prior to dressing change. Peri-Wound Care: Sween Lotion (Moisturizing lotion) Discharge Instructions: Apply moisturizing lotion as directed Prim Dressing: KerraCel Ag Gelling Fiber Dressing, 2x2 in (silver alginate) ary Discharge Instructions: Apply silver alginate to wound bed as instructed Secondary Dressing: ALLEVYN Gentle Border, 4x4 (in/in) Discharge Instructions: Apply over primary dressing as directed. Secured With: [employer], 4.5x3.1 (in/yd) Discharge Instructions: Secure with Kerlix as directed. Secured With: [employer] T ape, 2x10 (in/yd) Discharge Instructions: Secure dressing with tape as directed. 10/25/2022: The wound on her left great toe is almost completely healed; there is just a tiny open area under some callus and eschar. I used a curette to debride callus and eschar from her wound. We will continue silver alginate with a foam border dressing. She will follow-up in 2 weeks, at which time I anticipate that she will be healed. Electronic Signature(s) Signed: 10/25/2022 4:28:34 PM By: [name] Maudlin MD FACS Entered By: [name] on 10/25/2022 16:28:34 -------------------------------------------------------------------------------- HxROS Details Patient Name: Date of Service: Colgan, [name] 10/25/2022 3:15 PM Medical Record Number: [id number] Patient Account Number: [id number] Date of Birth/Sex: Treating RN: 10/14/1951 (71 y.o. [name] Primary Care Provider: Jim [name] Other Clinician: Dara [name], [name] ([id number]) [id number] Page 9 of 11 Referring Provider: Treating Provider/Extender: [name] in Treatment: 29 Information Obtained From Patient Eyes Medical History: Positive for: Cataracts Ear/Nose/Mouth/Throat Medical History: Past Medical History Notes: edentulous, GERD, seasonal allergies,  trouble swallowing Hematologic/Lymphatic Medical History: Positive for: Anemia Past Medical History Notes: hyperlipidemia Respiratory Medical History: Positive for: Sleep Apnea Cardiovascular Medical History: Positive for: Congestive Heart Failure; Hypertension Past Medical History Notes: complete heart block, bradycardia, paroxysmal atrial fibrillation, pacemaker Gastrointestinal Medical History: Past Medical History Notes: hemorrhoids Endocrine Medical History: Positive for: Type I Diabetes  Past Medical History Notes: hypothyroidism Time with diabetes: 34 yrs Treated with: Insulin Blood sugar tested every day: Yes Tested : on pump Genitourinary Medical History: Past Medical History Notes: proteinuria, frequent UTIs Musculoskeletal Medical History: Past Medical History Notes: decreased ROM L shoulder, hemiparesis affecting L side, arthritis Neurologic Medical History: Past Medical History Notes: benign paroxysmal positional vertigo, intracranial vascular stenosis, lacunar infarction, stroke Oncologic Medical History: Positive for: Received Radiation Past Medical History Notes: thyroid cancer Psychiatric Medical History: ZHOEY, [place] ([id number]) [id number] Page 10 of 11 Past Medical History Notes: depression HBO Extended History Items Eyes: Cataracts Immunizations Pneumococcal Vaccine: Received Pneumococcal Vaccination: Yes Received Pneumococcal Vaccination On or After 60th Birthday: Yes Implantable Devices Yes Hospitalization / Surgery History Type of Hospitalization/Surgery pacemaker implant loop recorder insertion and removal esophagogastroduodenoscopy TEE without cardioversion colonoscopy left and right heart catheterization abdominal angiogram thyroidectomy abdominal hysterectomy ankle fracture surgery Family and Social History Heart Disease: Yes - Mother; Hypertension: Yes - Mother,Father; Stroke: Yes -  Child; Never smoker; Marital Status - Divorced; Alcohol Use: Never; Drug Use: No History; Caffeine Use: Never; Financial Concerns: No; Food, Clothing or Shelter Needs: No; Support System Lacking: No; Transportation Concerns: No Electronic Signature(s) Signed: 10/25/2022 4:32:53 PM By: [name] Maudlin MD FACS Signed: 10/26/2022 9:02:06 AM By: [name] Catholic RN Entered By: [name] on 10/25/2022 16:25:20 -------------------------------------------------------------------------------- SuperBill Details Patient Name: Date of Service: [name], [name] 10/25/2022 Medical Record Number: [id number] Patient Account Number: [id number] Date of Birth/Sex: Treating RN: [birth year] (71 y.o. 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 W38.93 Type 1 diabetes mellitus with diabetic polyneuropathy Z68.41 Body mass index [BMI]40.0-44.9, adult E10.622 Type 1 diabetes mellitus with other skin ulcer L97.522 Non-pressure chronic ulcer of other part of left foot with fat layer exposed Facility Procedures : LILYMAE, SWIECH Code: [id number] [name] ETTA M ([id number] ICD-1 L Description: 97597 - DEBRIDE WOUND 1ST 20 SQ CM OR < 842) [phone] Diagnosis Description 97.522 Non-pressure chronic ulcer of other part of left foot with fat layer exposed Modifier: [id number] Quantity: 1 227.pdf Page 11 of 11 Physician Procedures : CPT4 Code Description Modifier [id number] - WC PHYS LEVEL 3 - EST PT 25 ICD-10 Diagnosis Description L97.522 Non-pressure chronic ulcer of other part of left foot with fat layer exposed E10.622 Type 1 diabetes mellitus with other skin  ulcer M25.00  Chronic diastolic (congestive) heart failure E10.42 Type 1 diabetes mellitus with diabetic polyneuropathy Quantity: 1 : [id number] - WC PHYS DEBR WO ANESTH 20 SQ CM ICD-10 Diagnosis Description L97.522 Non-pressure chronic ulcer of other part of left foot with fat layer exposed Quantity: 1 Electronic Signature(s) Signed: 10/25/2022 4:29:08 PM By: [name] Maudlin MD FACS Entered By: [name] on 10/25/2022 16:29:08

## 2022-10-26 NOTE — Progress Notes (Signed)
Cuthbert, [name] ([id number]) [id number] Page 1 of 7 Visit Report for 10/25/2022 Arrival Information Details Patient Name: Date of Service: [name], [name]. 10/25/2022 3:15 PM Medical Record Number: [id number] Patient Account Number: [id number] Date of Birth/Sex: Treating RN: 07/03/1951 (71 y.o. [name] Primary Care Tejas Seawood: Jim Like Other Clinician: Referring Dannon Perlow: Treating Tayja Manzer/Extender: Rory Percy in Treatment: 29 Visit Information History Since Last Visit All ordered tests and consults were completed: No Patient Arrived: Wheel Chair Added or deleted any medications: No Arrival Time: 15:33 Any new allergies or adverse reactions: No Accompanied By: self Had a fall or experienced change in No Transfer Assistance: Manual activities of daily living that may affect Patient Requires Transmission-Based Precautions: No risk of falls: Patient Has Alerts: Yes Signs or symptoms of abuse/neglect since last visito No Patient Alerts: Patient on Blood Thinner Hospitalized since last visit: No pacemaker Implantable device outside of the clinic excluding No cellular tissue based products placed in the center since last visit: Has Dressing in Place as Prescribed: Yes Pain Present Now: No Notes Manually lifting leg and foot to chair Electronic Signature(s) Signed: 10/26/2022 9:02:06 AM By: Dellie Catholic RN Entered By: Dellie Catholic on 10/25/2022 15:34:34 -------------------------------------------------------------------------------- Encounter Discharge Information Details Patient Name: Date of Service: [name], [name] 10/25/2022 3:15 PM Medical Record Number: [id number] Patient Account Number: [id number] Date of Birth/Sex: Treating RN: Mar 25, 1951 (71 y.o. [name] Primary Care Letcher Schweikert: Jim Like Other Clinician: Referring Hassan Blackshire: Treating Meoshia Billing/Extender: Rory Percy in Treatment: 29 Encounter Discharge Information Items Post Procedure Vitals Discharge Condition: Stable Temperature (F): 97.5 Ambulatory Status: Wheelchair Pulse (bpm): 70 Discharge Destination: Home Respiratory Rate (breaths/min): 18 Transportation: Private Auto Blood Pressure (mmHg): 159/81 Accompanied By: self Schedule Follow-up Appointment: Yes Clinical Summary of Care: Patient Declined Electronic Signature(s) Signed: 10/26/2022 9:02:06 AM By: Dellie Catholic RN Entered By: Dellie Catholic on 10/26/2022 09:00:51 [place], [name] ([id number]) [id number] Page 2 of 7 -------------------------------------------------------------------------------- Lower Extremity Assessment Details Patient Name: Date of Service: [name], [name]. 10/25/2022 3:15 PM Medical Record Number: [id number] Patient Account Number: [id number] Date of Birth/Sex: Treating RN: 1951-11-10 (71 y.o. [name] Primary Care Amery Minasyan: Jim Like Other Clinician: Referring Larua Collier: Treating Joyous Gleghorn/Extender: Rory Percy in Treatment: 29 Edema Assessment Assessed: Shirlyn Goltz: No] Patrice Paradise: No] [Left: Edema] [Right: :] Calf Left: Right: Point of Measurement: From Medial Instep 40.5 cm Ankle Left: Right: Point of Measurement: From Medial Instep 26 cm Electronic Signature(s) Signed: 10/26/2022 9:02:06 AM By: Dellie Catholic RN Entered By: Dellie Catholic on 10/25/2022 15:38:39 -------------------------------------------------------------------------------- Multi Wound Chart Details Patient Name: Date of Service: [name], [name] 10/25/2022 3:15 PM Medical Record Number: [id number] Patient Account Number: [id number] Date of Birth/Sex: Treating RN: 09-13-1951 (71 y.o. 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: Extremity Cataracts, Anemia, Sleep Apnea, N/A N/A Comorbid History: Congestive Heart Failure, Feeley, [name] ([id number]) [id number] Page 3 of 7 Hypertension, Type I Diabetes, Received Radiation 09/06/2022 N/A N/A Date Acquired: 4 N/A N/A Weeks of Treatment: Open N/A N/A Wound Status: No N/A N/A Wound Recurrence: 0.2x0.2x0.1 N/A  N/A Measurements L x W x D (cm) 0.031 N/A N/A A (cm) : rea 0.003 N/A N/A Volume (cm) : 95.10% N/A N/A % Reduction in A rea: 95.20% N/A N/A % Reduction in Volume: Grade 1 N/A N/A Classification: Medium N/A N/A Exudate A mount: Serosanguineous N/A N/A Exudate Type: red, brown N/A N/A Exudate Color: Large (67-100%) N/A N/A Granulation A mount: Red N/A N/A Granulation Quality: Small (1-33%) N/A N/A Necrotic A mount: Eschar, Adherent Slough N/A N/A Necrotic Tissue: Fat Layer (Subcutaneous Tissue): Yes N/A N/A Exposed Structures: Fascia: No Tendon: No Muscle: No Joint: No Bone: No None N/A N/A Epithelialization: Debridement - Selective/Open Wound N/A N/A Debridement: Pre-procedure Verification/Time Out 15:45 N/A N/A Taken: Lidocaine 4% Topical Solution N/A N/A Pain Control: Necrotic/Eschar, Slough N/A N/A Tissue Debrided: Non-Viable Tissue N/A N/A Level: 0.04 N/A N/A Debridement A (sq cm): rea Curette N/A N/A Instrument: Minimum N/A N/A Bleeding: Pressure N/A N/A Hemostasis A chieved: 0 N/A N/A Procedural Pain: 0 N/A N/A Post Procedural Pain: Procedure was tolerated well N/A N/A Debridement  Treatment Response: 0.2x0.2x0.1 N/A N/A Post Debridement Measurements L x W x D (cm) 0.003 N/A N/A Post Debridement Volume: (cm) Maceration: Yes N/A N/A Periwound Skin Moisture: Dry/Scaly: Yes Debridement N/A N/A Procedures Performed: Treatment Notes Electronic Signature(s) Signed: 10/25/2022 4:24:37 PM By: Fredirick Maudlin MD FACS Signed: 10/26/2022 9:02:06 AM By: Dellie Catholic RN Entered By: Fredirick Maudlin on 10/25/2022 16:24:37 -------------------------------------------------------------------------------- Multi-Disciplinary Care Plan Details Patient Name: Date of Service: [name], [name] [place] [name] 10/25/2022 3:15 PM Medical Record Number: [id number] Patient Account Number: [id number] Date of Birth/Sex: Treating RN: October 21, 1951 (71 y.o. [name] Primary Care Mantaj Chamberlin: Jim Like Other Clinician: Referring [name]: Treating Tove Wideman/Extender: Rory Percy in Treatment: [place] reviewed with physician Active Inactive Abuse / Safety / Falls / Self Care Management Nursing Diagnoses: History [address], [name] ([id number]) [id number] Page 4 of 7 Impaired physical mobility Goals: Patient/caregiver will verbalize/demonstrate measures taken to prevent injury and/or falls Date Initiated: 04/03/2022 Target Resolution Date: 12/24/2022 Goal Status: Active Interventions: Assess fall risk on admission and as needed Assess: immobility, friction, shearing, incontinence upon admission and as needed Assess impairment of mobility on admission and as needed per policy Notes: Nutrition Nursing Diagnoses: Impaired glucose control: actual or potential Potential for alteratiion in Nutrition/Potential for imbalanced nutrition Goals: Patient/caregiver will maintain therapeutic glucose control Date Initiated: 04/03/2022 Target Resolution Date: 12/24/2022 Goal Status:  Active Interventions: Assess HgA1c results as ordered upon admission and as needed Provide education on elevated blood sugars and impact on wound healing Treatment Activities: Patient referred to Primary Care Physician for further nutritional evaluation : 04/03/2022 Notes: Venous Leg Ulcer Nursing Diagnoses: Knowledge deficit related to disease process and management Potential for venous Insuffiency (use before diagnosis confirmed) Goals: Patient will maintain optimal edema control Date Initiated: 04/03/2022 Target Resolution Date: 12/24/2022 Goal Status: Active Interventions: Assess peripheral edema status every visit. Compression as ordered Provide education on venous insufficiency Treatment Activities: Therapeutic compression applied : 04/03/2022 Notes: Electronic Signature(s) Signed: 10/26/2022 9:02:06 AM By: Dellie Catholic RN Entered By: Dellie Catholic on 10/26/2022 08:59:14 -------------------------------------------------------------------------------- Pain Assessment Details Patient Name: Date of Service: [name], [name] 10/25/2022 3:15 PM Medical Record Number: [id number] Patient Account Number: [id number] Date of Birth/Sex: Treating RN: [birth year] (71 y.o. [name] Primary Care Million Maharaj: Jim Like Other Clinician: Referring Harrietta Incorvaia: Treating Cameren Odwyer/Extender: Rory Percy in Treatment: [address] Problems [name], [name] ([id number]) [id number]  Page 5 of 7 Location of Pain Severity and Description of Pain Patient Has Paino No Site Locations Pain Management and Medication Current Pain Management: Electronic Signature(s) Signed: 10/26/2022 9:02:06 AM By: Dellie Catholic RN Entered By: Dellie Catholic on 10/25/2022 15:38:30 -------------------------------------------------------------------------------- Patient/Caregiver Education Details Patient Name: Date of Service: [name] Ducking. 11/1/2023andnbsp3:15 PM Medical Record Number: [id number] Patient Account Number: [id number] Date of Birth/Gender: Treating RN: 1951-02-12 (71 y.o. [name] Primary Care Physician: Jim Like Other Clinician: Referring Physician: Treating Physician/Extender: Rory Percy in Treatment: 29 Education Assessment Education Provided To: Patient Education Topics Provided Wound/Skin Impairment: Methods: Explain/Verbal Responses: Return demonstration correctly Electronic Signature(s) Signed: 10/26/2022 9:02:06 AM By: Dellie Catholic RN Entered By: Dellie Catholic on 10/26/2022 08:59:30 -------------------------------------------------------------------------------- Wound Assessment Details Patient Name: Date of Service: [name], [name] 10/25/2022 3:15 PM [name], [name] ([id number]) [id number] Page 6 of 7 Medical Record Number: [id number] Patient Account Number: [id number] Date of Birth/Sex: Treating RN: [birth year] (71 y.o. [name] Primary Care Kamonte Mcmichen: Jim Like Other Clinician: Referring Aamani Moose: Treating Catina Nuss/Extender: Rory Percy in Treatment: 29 Wound Status Wound Number: 13 Primary Diabetic Wound/Ulcer of the Lower Extremity Etiology: Wound Location: Left T Great oe Wound Open Wounding Event: Footwear Injury Status: Date Acquired: 09/06/2022 Comorbid Cataracts, Anemia, Sleep Apnea, Congestive Heart Failure, Weeks Of Treatment: 4 History: Hypertension, Type I Diabetes, Received Radiation Clustered Wound: No Photos Wound Measurements Length: (cm) 0.2 Width: (cm) 0.2 Depth: (cm) 0.1 Area: (cm) 0.031 Volume: (cm) 0.003 % Reduction in Area: 95.1% % Reduction in Volume: 95.2% Epithelialization: None Tunneling: No Undermining: No Wound Description Classification: Grade 1 Exudate Amount: Medium Exudate Type: Serosanguineous Exudate Color: red,  brown Foul Odor After Cleansing: No Slough/Fibrino Yes Wound Bed Granulation Amount: Large (67-100%) Exposed Structure Granulation Quality: Red Fascia Exposed: No Necrotic Amount: Small (1-33%) Fat Layer (Subcutaneous Tissue) Exposed: Yes Necrotic Quality: Eschar, Adherent Slough Tendon Exposed: No Muscle Exposed: No Joint Exposed: No Bone Exposed: No Periwound Skin Texture Texture Color No Abnormalities Noted: No No Abnormalities Noted: No Moisture No Abnormalities Noted: No Dry / Scaly: Yes Maceration: Yes Treatment Notes Wound #13 (Toe Great) Wound Laterality: Left Cleanser Soap and Water Discharge Instruction: May shower and wash wound with dial antibacterial soap and water prior to dressing change. Peri-Wound Care Sween Lotion (Moisturizing lotion) Discharge Instruction: Apply moisturizing lotion as directed Topical Primary Dressing [place], [name] ([id number]) [id number] Page 7 of 7 KerraCel Ag Gelling Fiber Dressing, 2x2 in (silver alginate) Discharge Instruction: Apply silver alginate to wound bed as instructed Secondary Dressing ALLEVYN Gentle Border, 4x4 (in/in) Discharge Instruction: Apply over primary dressing as directed. Secured With [employer], 4.5x3.1 (in/yd) Discharge Instruction: Secure with Kerlix as directed. Transpore Surgical Tape, 2x10 (in/yd) Discharge Instruction: Secure dressing with tape as directed. Compression Wrap Compression Stockings Add-Ons Electronic Signature(s) Signed: 10/26/2022 9:02:06 AM By: Dellie Catholic RN Entered By: Dellie Catholic on 10/25/2022 15:41:54 -------------------------------------------------------------------------------- Vitals Details Patient Name: Date of Service: [name], [name] 10/25/2022 3:15 PM Medical Record Number: [id number] Patient Account Number: [id number] Date of Birth/Sex: Treating RN: 1951/11/14 (71 y.o. [name] Primary Care Andrius Andrepont:  Jim Like Other Clinician: Referring Gionni Freese: Treating Marsalis Beaulieu/Extender: Rory Percy in Treatment: 29 Vital Signs Time Taken: 15:35 Temperature (F): 97.5 Height (in): 67 Pulse (bpm): 70 Weight (lbs): 243 Respiratory Rate (breaths/min): 18 Body Mass Index (BMI): 38.1 Blood Pressure (mmHg): 159/81 Reference Range: 80 - 120 mg /  dl Electronic Signature(s) Signed: 10/26/2022 9:02:06 AM By: Dellie Catholic RN Entered By: Dellie Catholic on 10/25/2022 15:39:19

## 2022-10-27 ENCOUNTER — Telehealth: Payer: Self-pay | Admitting: Pulmonary Disease

## 2022-10-27 NOTE — Telephone Encounter (Signed)
Spoke with pt who states her POC is not working correctly. I will not turn on at first try and pt has to press power button several times to get it to come on. Pt stated DME is [employer]. I called and spoke with [employer] and placed a service order for someone for [employer] to go to pt's house to look at [place]. Instructed pt if no one from [employer] had contacted her before Monday to call and let us know. Pt stated understanding.   Pt is also wanting a smaller POC. She is scheduled for an OV on 11/06/22 to evaluate for POC. Pt is wheelchair bond and states she can not walk. Routing to Dr. Silas Flood as Juluis Rainier

## 2022-10-31 NOTE — Telephone Encounter (Signed)
Called and spoke to [facility] from [place] and she is going to call the patient. Early Chars states someone form [employer] was at her house yesterday about the [place]. She will call patient to see if she is still having issues with POC. Nothing further needed

## 2022-10-31 NOTE — Telephone Encounter (Signed)
Patient states is gasping for air. States [facility] has not contacted her. Patient phone number is [phone].

## 2022-11-06 ENCOUNTER — Ambulatory Visit (INDEPENDENT_AMBULATORY_CARE_PROVIDER_SITE_OTHER): Payer: Medicare PPO | Admitting: Pulmonary Disease

## 2022-11-06 ENCOUNTER — Encounter: Payer: Self-pay | Admitting: Pulmonary Disease

## 2022-11-06 VITALS — BP 126/70 | HR 62

## 2022-11-06 DIAGNOSIS — J9611 Chronic respiratory failure with hypoxia: Secondary | ICD-10-CM | POA: Diagnosis not present

## 2022-11-06 MED ORDER — FLUTICASONE-SALMETEROL 500-50 MCG/ACT IN AEPB: 1.0000 | INHALATION_SPRAY | Freq: Two times a day (BID) | RESPIRATORY_TRACT | 3 refills | Status: DC

## 2022-11-06 NOTE — Patient Instructions (Signed)
I am sorry to Flonase did not work  Try Advair 1 puff once a day.  Rinse your mouth out with water after every use.  We will see about getting new oxygen, portable concentrator and home concentrator.  I worry the cough could be related to things getting stuck in her throat, going down the wrong tube.  This is hard to treat and the inhaler may not help with this.  If this continues to happen I would recommend seeing a GI specialist or gastroenterologist.  Return to clinic in 3 months or sooner if needed with Dr. Silas Flood

## 2022-11-07 ENCOUNTER — Other Ambulatory Visit: Payer: Self-pay | Admitting: *Deleted

## 2022-11-07 ENCOUNTER — Ambulatory Visit: Payer: Medicare PPO | Admitting: Internal Medicine

## 2022-11-07 ENCOUNTER — Encounter: Payer: Self-pay | Admitting: Internal Medicine

## 2022-11-07 VITALS — BP 150/53 | HR 51 | Ht 67.0 in

## 2022-11-07 DIAGNOSIS — I480 Paroxysmal atrial fibrillation: Secondary | ICD-10-CM | POA: Diagnosis not present

## 2022-11-07 DIAGNOSIS — I10 Essential (primary) hypertension: Secondary | ICD-10-CM | POA: Diagnosis not present

## 2022-11-07 DIAGNOSIS — I5032 Chronic diastolic (congestive) heart failure: Secondary | ICD-10-CM | POA: Diagnosis not present

## 2022-11-07 MED ORDER — GABAPENTIN 300 MG PO CAPS
300.0000 mg | ORAL_CAPSULE | ORAL | 0 refills | Status: DC | PRN
Start: 2022-11-07 — End: 2023-01-08

## 2022-11-07 MED ORDER — LOSARTAN POTASSIUM 25 MG PO TABS: 25.0000 mg | ORAL_TABLET | Freq: Every day | ORAL | 3 refills | Status: DC

## 2022-11-07 NOTE — Progress Notes (Signed)
Eyes:  '@Patient'$  ID: [name], female    DOB: 01/03/1951, 71 y.o.   MRN: [id number]  Chief Complaint  Patient presents with   Follow-up    Follow up for chronic cough. Pt was having issues with her [employer] unit and the battery was giving her issues so she is back with [employer].     Referring provider: Eppie Gibson, MD  HPI:   71 y.o. woman whom are seen in follow up for evaluation of pulmonary hypertension and hypoxemia.  Most recent PCP note reviewed.  Most recent cardiology note reviewed.    Returns for routine follow-up.  Cough was primary complaint last visit.  Endorse postnasal drip.  Been on Flonase.  Did not seem to help.  Cough unchanged.  She is back today to qualify for POC oxygen device.  Issues with her currently.  Also reports issues with her home oxygen, to rule out etc.  New orders placed.  Discussed etiology of cough most commonly GERD, postnasal drip, asthma.  We will try an inhaler at this time.  She denies GERD symptoms and postnasal drip treatment did not seem to help.  HPI at initial visit: Patient with longstanding cardiac history including intermittent volume overload.  Atrial fibrillation.  On anticoagulation.  Has longstanding dyspnea.  Shortness of breath.  However, she is been largely wheelchair-bound for a couple years.  Following strokes.  She is on oxygen for 6 months.  This is on discharge from the hospital.  Hospital discharge summary 01/2022 reviewed at the time she was placed on oxygen.  At that time she was diuresed.  TTE estimated elevated PASP.  Breathing about the same.  She reports good adherence to oxygen.  She has not used BiPAP.  She has not used in some time.  Does not use regularly.  Despite rest and the importance of using this.  At last hospitalization she was asked to do this.  She is afraid for a few days and does not use it reliably and months.  Reviewed most recent chest x-ray 05/2019.  Shows enlarged cardiac silhouette, prominent  interstitium suggestive of volume overload on my interpretation.  PMH: ESRD on HD, atrial fibrillation, diabetes, hypothyroidism, CVA Surgical history: Hysterectomy, ankle fracture surgery, Family history: Mother with CAD, hyperlipidemia, hypertension, renal disease, father with CVA Social history: Never smoker, lives in [place] / Pulmonary Flowsheets:   ACT:      No data to display          MMRC:     No data to display          Epworth:      No data to display          Tests:   FENO:  No results found for: "NITRICOXIDE"  PFT:     No data to display          WALK:      No data to display          Imaging: Personally reviewed and as per EMR discussion this note No results found.  Lab Results: Personally reviewed CBC    Component Value Date/Time   WBC 4.7 05/29/2022 1326   RBC 3.83 (L) 05/29/2022 1326   HGB 10.9 (L) 05/29/2022 1326   HGB 11.0 (L) 03/02/2022 1548   HCT 35.5 (L) 05/29/2022 1326   HCT 33.8 (L) 03/02/2022 1548   PLT 203 05/29/2022 1326   PLT 185 03/02/2022 1548   MCV 92.7  05/29/2022 1326   MCV 88 03/02/2022 1548   MCH 28.5 05/29/2022 1326   MCHC 30.7 05/29/2022 1326   RDW 15.1 05/29/2022 1326   RDW 12.9 03/02/2022 1548   LYMPHSABS 1.0 02/10/2022 1212   LYMPHSABS 0.9 05/21/2019 0947   MONOABS 0.6 02/10/2022 1212   EOSABS 0.1 02/10/2022 1212   EOSABS 0.0 05/21/2019 0947   BASOSABS 0.0 02/10/2022 1212   BASOSABS 0.0 05/21/2019 0947    BMET    Component Value Date/Time   NA 142 05/29/2022 1326   NA 145 (H) 03/02/2022 1548   K 3.6 05/29/2022 1326   CL 105 05/29/2022 1326   CO2 29 05/29/2022 1326   GLUCOSE 228 (H) 05/29/2022 1326   BUN 10 05/29/2022 1326   BUN 13 03/02/2022 1548   CREATININE 1.07 (H) 05/29/2022 1326   CREATININE 1.03 (H) 08/24/2015 1652   CALCIUM 9.1 05/29/2022 1326   GFRNONAA 56 (L) 05/29/2022 1326   GFRAA >60 06/25/2020 1131    BNP    Component Value Date/Time   BNP  190.5 (H) 05/29/2022 1326    ProBNP No results found for: "PROBNP"  Specialty Problems       Pulmonary Problems   OSA (obstructive sleep apnea)    BiPAP at home Severe Range 05/27/2019: need f/u with sleep clinic in 10 weeks- not yet bringing patients in for in-lab testing for CPAP titration studies, due to the virus pandemic> autoPAP       Dyspnea   Hypoxia   SOB (shortness of breath)    Allergies  Allergen Reactions   Latex Rash    Immunization History  Administered Date(s) Administered   Fluad Quad(high Dose 65+) 11/16/2021   Influenza, High Dose Seasonal PF 10/12/2017   Influenza,inj,Quad PF,6+ Mos 08/20/2013, 09/30/2014, 09/22/2015, 09/24/2016, 12/09/2018, 09/24/2019   Influenza-Unspecified 09/24/2017   PFIZER(Purple Top)SARS-COV-2 Vaccination 06/28/2020   Pneumococcal Conjugate-13 11/24/2016   Pneumococcal Polysaccharide-23 01/25/2012, 06/07/2018   Tdap 01/25/2012   Zoster Recombinat (Shingrix) 04/11/2017    Past Medical History:  Diagnosis Date   Abnormal echocardiogram    Anemia    Arthritis    BPPV (benign paroxysmal positional vertigo), right 06/13/2017   Bradycardia 03/07/2020   Cerebrovascular accident (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 ([place]) 10/29/2015   CHB (complete heart block) ([place]) 03/08/2020   CHF (congestive heart failure) (HCC)    Closed fracture of left proximal humerus 07/08/2019   Decreased range of motion of left shoulder 09/03/2020   Depression    Dizzy 03/07/2020   bradycardia   Edentulous 09/03/2020   GERD (gastroesophageal reflux disease)    Hemiparesis affecting left side as late effect of cerebrovascular accident (CVA) ([place]) 03/08/2020   Hemorrhoids    History of Falls with injury 03/08/2020   Fall resulting in left humeral fracture   Hyperlipidemia    Hypertension    Hypothyroidism    Intracranial vascular stenosis    Lacunar infarction ([place])    Brain MRI 07/2021 multiple small remote pontine lacunar infarcts   Near  syncope 03/08/2020   Paroxysmal atrial fibrillation (HCC)    Presence of permanent cardiac pacemaker    Proteinuria 03/08/2020   Seasonal allergies    Stroke ([place])    Thyroid cancer ([place])    per pt S/p Total Thyroidectomy with Radioactive Iodine Therapy   Type 1 diabetes mellitus ([place])     Tobacco History: Social History   Tobacco Use  Smoking Status Never  Smokeless Tobacco Never   Counseling given: Not Answered  Continue to not smoke  Outpatient Encounter Medications as of 11/06/2022  Medication Sig   acetaminophen (TYLENOL) 325 MG tablet Take 2 tablets (650 mg total) by mouth every 6 (six) hours as needed for moderate pain.   amLODipine (NORVASC) 10 MG tablet Take 10 mg by mouth daily.   apixaban (ELIQUIS) 5 MG TABS tablet Take 1 tablet (5 mg total) by mouth 2 (two) times daily.   atorvastatin (LIPITOR) 80 MG tablet TAKE 1 TABLET(80 MG) BY MOUTH EVERY EVENING   busPIRone (BUSPAR) 5 MG tablet TAKE 1 TABLET(5 MG) BY MOUTH THREE TIMES DAILY   Fexofenadine HCl (ALLEGRA PO) Take 10 mg by mouth 2 (two) times daily.   fluticasone (FLONASE) 50 MCG/ACT nasal spray Place 1 spray into both nostrils in the morning and at bedtime.   fluticasone-salmeterol (ADVAIR) 500-50 MCG/ACT AEPB Inhale 1 puff into the lungs in the morning and at bedtime.   furosemide (LASIX) 40 MG tablet Take 1 tablet (40 mg total) by mouth 2 (two) times daily.   gabapentin (NEURONTIN) 300 MG capsule Take 1 capsule (300 mg total) by mouth as needed.   insulin aspart (NOVOLOG) 100 UNIT/ML injection Inject 5 Units into the skin 3 (three) times daily before meals. For blood sugar greater than or equal to 200   Insulin Human (INSULIN PUMP) SOLN Inject 1 each into the skin 3 times daily with meals, bedtime and 2 AM.   levothyroxine (SYNTHROID) 175 MCG tablet Take 1 tablet (175 mcg total) by mouth daily before breakfast.   loratadine (CLARITIN) 10 MG tablet Take 10 mg by mouth every evening.   metoprolol succinate  (TOPROL-XL) 50 MG 24 hr tablet Take 1 tablet (50 mg total) by mouth daily. Take with or immediately following a meal.   pantoprazole (PROTONIX) 40 MG tablet Take 1 tablet (40 mg total) by mouth 2 (two) times daily.   polyethylene glycol powder (GLYCOLAX/MIRALAX) 17 GM/SCOOP powder Take 17 g by mouth daily. (Patient taking differently: Take 17 g by mouth as needed.)   polyethylene glycol powder (GLYCOLAX/MIRALAX) 17 GM/SCOOP powder Take 17 g by mouth 2 (two) times daily as needed for moderate constipation.   potassium chloride (KLOR-CON) 10 MEQ tablet Take 1 tablet (10 mEq total) by mouth daily.   traMADol (ULTRAM) 50 MG tablet Take 1 tablet (50 mg total) by mouth every 6 (six) hours as needed.   No facility-administered encounter medications on file as of 11/06/2022.     Review of Systems  Review of Systems  N/a Physical Exam  BP 126/70 (BP Location: Left Arm, Patient Position: Sitting, Cuff Size: Normal)   Pulse 62   SpO2 98% Comment: 1L via POC  Wt Readings from Last 5 Encounters:  09/11/22 253 lb (114.8 kg)  07/27/22 253 lb (114.8 kg)  03/02/22 243 lb (110.2 kg)  02/15/22 243 lb 13.3 oz (110.6 kg)  01/20/22 250 lb (113.4 kg)    BMI Readings from Last 5 Encounters:  09/11/22 39.63 kg/m  07/27/22 39.63 kg/m  03/02/22 38.06 kg/m  02/15/22 38.19 kg/m  01/20/22 39.16 kg/m     Physical Exam General: Sitting in chair, chronically ill-appearing EOMI, no icterus Neck: Supple, JVP difficult to assess given habitus Pulmonary: Clear, normal work of breathing Cardiovascular: Warm, significant lower extremity edema noted Abdomen: Nondistended, bowel sounds present MSK: No synovitis, no joint effusion Neuro: Normal gait, global but symmetric weakness in lower extremities, upper extremities okay Psych: Normal mood, full affect   Assessment & Plan:   Presumed pulmonary  hypertension: Based on echocardiogram findings with elevated right-sided pressures.  Likely multifactorial  due to group 2 disease given the signs of left-sided volume overload on the chest imaging in the past, group 2 disease in setting of hypoxemia and OSA nonadherent to NIPPV, group 5 disease in the setting of hemodialysis.  Unfortunate she is a very poor candidate for vasodilators given the multifactorial nature of her disease.  She is nonadherent to BiPAP therapy which was explained to her is a foundational treatment of group 3 disease related to OSA.  Furthermore, she is essentially wheelchair-bound due to prior strokes and weakness.  Do not think she would benefit much in terms of dyspnea with pulmonary evaluation given the risk she is a poor candidate.  Strongly recommend I encouraged her to reengage with sleep doctor.  Could pursue with her nephrologist assessing flows through fistula and if elevated evaluating possibility of banding of fistula to reduce flows to decrease stress on the RV.  Chronic hypoxemic respiratory failure: Likely multifactorial related related to OHS, VQ mismatch with presumed pulmonary hypertension, and intermittent volume overload.  Requalify, new order for oxygen today.  Chronic cough: Previously endorsed postnasal drip, nasal congestion.  Addition of Flonase 1 spray each nostril twice daily did not improve things.  Trial high-dose ICS/LABA therapy via Advair discus.   Return in about 3 months (around 02/06/2023).   Lanier Clam, MD 11/07/2022

## 2022-11-07 NOTE — Progress Notes (Signed)
Primary Physician/Referring:  Eppie Gibson, MD  Patient ID: [name], female    DOB: 04/26/1951, 71 y.o.   MRN: [id number]  Chief Complaint  Patient presents with   Atrial Fibrillation   New Patient (Initial Visit)   HPI:    [name]  is a 71 y.o. female with past medical history significant for PAF, CHB s/p PPM, and strokes in 2016 and 2019 who is here to establish care with cardiology. Her previous cardiologist is no longer in her network and she had to find another one. Patient follows with EP for her pacemaker checks and was last seen in July. Patient is feeling well on her current medication regiment. She denies chest pain, shortness of breath, palpitations, diaphoresis, syncope, orthopnea, PND, claudication, headaches. Her blood pressure is slightly elevated and she is agreeable to adding another medication to better control her pressure. She had a TEE in February of this year and since she is not having any symptoms, we do not need to repeat any imaging at this time. Patient is agreeable to our plan.     Past Medical History:  Diagnosis Date   Abnormal echocardiogram    Anemia    Arthritis    BPPV (benign paroxysmal positional vertigo), right 06/13/2017   Bradycardia 03/07/2020   Cerebrovascular accident (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 ([place]) 10/29/2015   CHB (complete heart block) ([place]) 03/08/2020   CHF (congestive heart failure) ([place])    Closed fracture of left proximal humerus 07/08/2019   Decreased range of motion of left shoulder 09/03/2020   Depression    Dizzy 03/07/2020   bradycardia   Edentulous 09/03/2020   GERD (gastroesophageal reflux disease)    Hemiparesis affecting left side as late effect of cerebrovascular accident (CVA) ([place]) 03/08/2020   Hemorrhoids    History of Falls with injury 03/08/2020   Fall resulting in left humeral fracture   Hyperlipidemia    Hypertension    Hypothyroidism    Intracranial  vascular stenosis    Lacunar infarction ([place])    Brain MRI 07/2021 multiple small remote pontine lacunar infarcts   Near syncope 03/08/2020   Paroxysmal atrial fibrillation (HCC)    Presence of permanent cardiac pacemaker    Proteinuria 03/08/2020   Seasonal allergies    Stroke ([place])    Thyroid cancer ([place])    per pt S/p Total Thyroidectomy with Radioactive Iodine Therapy   Type 1 diabetes mellitus ([place])    Past Surgical History:  Procedure Laterality Date   ABDOMINAL ANGIOGRAM N/A 05/14/2012   Procedure: ABDOMINAL ANGIOGRAM;  Surgeon: Laverda Page, MD;  Location[facility] C[facility] LAB;  Service: Cardiovascular;  Laterality: N/A;   ABDOMINAL HYSTERECTOMY  1995   partial   ANKLE FRACTURE SURGERY Right    CARDIAC CATHETERIZATION     with coronary angiogram   cataract  Bilateral 2018   COLONOSCOPY N/A 09/09/2014   Procedure: COLONOSCOPY;  Surgeon: Gatha Mayer, MD;  Location: [facility];  Service: Endoscopy;  Laterality: N/A;   ESOPHAGOGASTRODUODENOSCOPY (EGD) WITH PROPOFOL N/A 12/03/2018   Procedure: ESOPHAGOGASTRODUODENOSCOPY (EGD) WITH PROPOFOL;  Surgeon: Doran Stabler, MD;  Location: [facility];  Service: Gastroenterology;  Laterality: N/A;   LEFT AND RIGHT HEART CATHETERIZATION WITH CORONARY ANGIOGRAM N/A 05/14/2012   Procedure: LEFT AND RIGHT HEART CATHETERIZATION WITH CORONARY ANGIOGRAM;  Surgeon: Laverda Page, MD;  Location: [facility] CATH LAB;  Service: Cardiovascular;  Laterality: N/A;   LOOP RECORDER INSERTION N/A 12/23/2018  Procedure: LOOP RECORDER INSERTION;  Surgeon: Constance Haw, MD;  Location: [facility] CV LAB;  Service: Cardiovascular;  Laterality: N/A;   LOOP RECORDER REMOVAL N/A 06/25/2020   Procedure: LOOP RECORDER REMOVAL;  Surgeon: Constance Haw, MD;  Location: [facility] CV LAB;  Service: Cardiovascular;  Laterality: N/A;   MINI METER   09/08/2014   ELECTRONIC INSULIN PUMP   PACEMAKER IMPLANT N/A 06/25/2020   Procedure: PACEMAKER  IMPLANT;  Surgeon: Constance Haw, MD;  Location: [facility] CV LAB;  Service: Cardiovascular;  Laterality: N/A;   TEE WITHOUT CARDIOVERSION N/A 10/16/2018   Procedure: TRANSESOPHAGEAL ECHOCARDIOGRAM (TEE);  Surgeon: Buford Dresser, MD;  Location: [facility];  Service: Cardiovascular;  Laterality: N/A;   THYROIDECTOMY  2006   for thyroid cancer   Family History  Problem Relation Age of Onset   Heart disease Mother    Hyperlipidemia Mother    Hypertension Mother    Renal Disease Mother        insufficiency   Heart attack Mother        in his 69's   Early death Father        Head Injury at Work   Hyperlipidemia Father    Hypertension Father    Stroke Son    Diabetes Maternal Aunt    Prostate cancer Paternal Uncle    Heart disease Maternal Aunt    Heart failure Maternal Grandfather    Heart disease Maternal Grandfather    Heart attack Maternal Grandfather        Died in his 35's   Breast cancer Neg Hx     Social History   Tobacco Use   Smoking status: Never   Smokeless tobacco: Never  Substance Use Topics   Alcohol use: No   Marital Status: Divorced  ROS  Review of Systems  Cardiovascular:  Negative for chest pain, claudication, dyspnea on exertion, irregular heartbeat, leg swelling, palpitations and syncope.   Objective  Blood pressure (!) 150/53, pulse (!) 51, height _0  (1.702 m), SpO2 93 %. Body mass index is 39.63 kg/m.     11/07/2022    1:21 PM 11/06/2022    2:43 PM 09/11/2022    3:53 PM  Vitals with BMI  Height _1   _2   Weight   253 lbs  BMI   03.54  Systolic 656 812 751  Diastolic 53 70 70  Pulse 51 62 69     Physical Exam Vitals reviewed.  HENT:     Head: Normocephalic and atraumatic.  Cardiovascular:     Rate and Rhythm: Normal rate and regular rhythm.     Pulses: Normal pulses.     Heart sounds: Normal heart sounds. No murmur heard. Pulmonary:     Effort: Pulmonary effort is normal.     Breath sounds: Normal breath  sounds.  Abdominal:     General: Bowel sounds are normal.  Musculoskeletal:     Comments: +chronic pedal edema  Skin:    General: Skin is warm and dry.  Neurological:     Mental Status: She is alert.     Medications and allergies   Allergies  Allergen Reactions   Latex Rash     Medication list after today's encounter   Current Outpatient Medications:    acetaminophen (TYLENOL) 325 MG tablet, Take 2 tablets (650 mg total) by mouth every 6 (six) hours as needed for moderate pain., Disp: , Rfl:    amLODipine (NORVASC) 10 MG tablet, Take 10  mg by mouth daily., Disp: , Rfl:    apixaban (ELIQUIS) 5 MG TABS tablet, Take 1 tablet (5 mg total) by mouth 2 (two) times daily., Disp: 180 tablet, Rfl: 1   atorvastatin (LIPITOR) 80 MG tablet, TAKE 1 TABLET(80 MG) BY MOUTH EVERY EVENING, Disp: 90 tablet, Rfl: 1   busPIRone (BUSPAR) 5 MG tablet, TAKE 1 TABLET(5 MG) BY MOUTH THREE TIMES DAILY, Disp: 90 tablet, Rfl: 0   cholecalciferol (VITAMIN D3) 25 MCG (1000 UNIT) tablet, Take 1,000 Units by mouth daily., Disp: , Rfl:    Cranberry 50 MG CHEW, Chew by mouth., Disp: , Rfl:    Fexofenadine HCl (ALLEGRA PO), Take 10 mg by mouth 2 (two) times daily., Disp: , Rfl:    fluticasone (FLONASE) 50 MCG/ACT nasal spray, Place 1 spray into both nostrils in the morning and at bedtime., Disp: 16 g, Rfl: 12   fluticasone-salmeterol (ADVAIR) 500-50 MCG/ACT AEPB, Inhale 1 puff into the lungs in the morning and at bedtime., Disp: 60 each, Rfl: 3   furosemide (LASIX) 40 MG tablet, Take 1 tablet (40 mg total) by mouth 2 (two) times daily., Disp: 180 tablet, Rfl: 3   gabapentin (NEURONTIN) 300 MG capsule, Take 1 capsule (300 mg total) by mouth as needed., Disp: 30 capsule, Rfl: 0   insulin aspart (NOVOLOG) 100 UNIT/ML injection, Inject 5 Units into the skin 3 (three) times daily before meals. For blood sugar greater than or equal to 200, Disp: 10 mL, Rfl: 11   Insulin Human (INSULIN PUMP) SOLN, Inject 1 each into the  skin 3 times daily with meals, bedtime and 2 AM., Disp: , Rfl:    levothyroxine (SYNTHROID) 175 MCG tablet, Take 1 tablet (175 mcg total) by mouth daily before breakfast., Disp: 30 tablet, Rfl: 2   loratadine (CLARITIN) 10 MG tablet, Take 10 mg by mouth every evening., Disp: , Rfl:    losartan (COZAAR) 25 MG tablet, Take 1 tablet (25 mg total) by mouth daily., Disp: 90 tablet, Rfl: 3   metoprolol succinate (TOPROL-XL) 50 MG 24 hr tablet, Take 1 tablet (50 mg total) by mouth daily. Take with or immediately following a meal., Disp: 30 tablet, Rfl: 0   multivitamin-lutein (OCUVITE-LUTEIN) CAPS capsule, Take 1 capsule by mouth daily., Disp: , Rfl:    Omega-3 Fatty Acids (FISH OIL) 300 MG CAPS, Take by mouth., Disp: , Rfl:    pantoprazole (PROTONIX) 40 MG tablet, Take 1 tablet (40 mg total) by mouth 2 (two) times daily., Disp: 60 tablet, Rfl: 0   polyethylene glycol powder (GLYCOLAX/MIRALAX) 17 GM/SCOOP powder, Take 17 g by mouth 2 (two) times daily as needed for moderate constipation., Disp: 3350 g, Rfl: 3   potassium chloride (KLOR-CON) 10 MEQ tablet, Take 1 tablet (10 mEq total) by mouth daily., Disp: 30 tablet, Rfl: 0   traMADol (ULTRAM) 50 MG tablet, Take 1 tablet (50 mg total) by mouth every 6 (six) hours as needed., Disp: 10 tablet, Rfl: 0  Laboratory examination:   Lab Results  Component Value Date   NA 142 05/29/2022   K 3.6 05/29/2022   CO2 29 05/29/2022   GLUCOSE 228 (H) 05/29/2022   BUN 10 05/29/2022   CREATININE 1.07 (H) 05/29/2022   CALCIUM 9.1 05/29/2022   EGFR 57 (L) 03/02/2022   GFRNONAA 56 (L) 05/29/2022       Latest Ref Rng & Units 05/29/2022    1:26 PM 03/02/2022    3:48 PM 02/15/2022    3:48 AM  CMP  Glucose 70 - 99 mg/dL 228  182  320   BUN 8 - 23 mg/dL _0 Creatinine 0.44 - 1.00 mg/dL 1.07  1.05  1.00   Sodium 135 - 145 mmol/L 142  145  138   Potassium 3.5 - 5.1 mmol/L 3.6  3.7  4.0   Chloride 98 - 111 mmol/L 105  102  100   CO2 22 - 32 mmol/L _1 Calcium 8.9 - 10.3 mg/dL 9.1  9.0  8.5       Latest Ref Rng & Units 05/29/2022    1:26 PM 03/02/2022    3:48 PM 02/15/2022    3:48 AM  CBC  WBC 4.0 - 10.5 K/uL 4.7  4.2  4.8   Hemoglobin 12.0 - 15.0 g/dL 10.9  11.0  9.6   Hematocrit 36.0 - 46.0 % 35.5  33.8  30.3   Platelets 150 - 400 K/uL 203  185  202     Lipid Panel No results for input(s): "CHOL", "TRIG", "[place]", "VLDL", "HDL", "CHOLHDL", "LDLDIRECT" in the last 8760 hours.  HEMOGLOBIN A1C Lab Results  Component Value Date   HGBA1C 8.3 (H) 02/11/2022   MPG 191.51 02/11/2022   TSH Recent Labs    02/11/22 0032 05/29/22 1932  TSH 3.228 2.658    External labs:   Recent Labs: 02/11/2022: ALT 10; Magnesium 1.7 05/29/2022: B Natriuretic Peptide 190.5; BUN 10; Creatinine, Ser 1.07; Hemoglobin 10.9; Platelets 203; Potassium 3.6; Sodium 142; TSH 2.658   Radiology:    Cardiac Studies:    TTE 10/14/18  Review of the above records today demonstrates:  - Left ventricle: The cavity size was normal. There was moderate   concentric hypertrophy. Systolic function was normal. The   estimated ejection fraction was in the range of 60% to 65%. Wall   motion was normal; there were no regional wall motion   abnormalities. The study is not technically sufficient to allow   evaluation of LV diastolic function. - Left atrium: The atrium was normal in size. - Right atrium: The atrium was normal in size.       EKG:   11/07/2022: normal sinus rhythm, rate 51, V-paced properly functioning  Assessment     ICD-10-CM   1. Paroxysmal atrial fibrillation (HCC)  I48.0 EKG 12-Lead    2. Essential hypertension  I10     3. Chronic heart failure with preserved ejection fraction (HCC)  I50.32        Orders Placed This Encounter  Procedures   EKG 12-Lead    Meds ordered this encounter  Medications   losartan (COZAAR) 25 MG tablet    Sig: Take 1 tablet (25 mg total) by mouth daily.    Dispense:  90 tablet    Refill:  3     Medications Discontinued During This Encounter  Medication Reason   polyethylene glycol powder (GLYCOLAX/MIRALAX) 17 GM/SCOOP powder      Recommendations:   [name] Reshma Hoey is a 71 y.o.  F with multiple medical co morbidities   PAF -On Eliquis and Toprol 79m     Pacemaker for CHB - Follows with Dr. CCurt Bears RTommye Standard- Functioning well.     Hyperlipidemia - High intensity statin therapy, goal LDL less than 70    Diabetes with essential hypertension -Continue with antihypertensives.   --Add Losartan [id number] Encourage low-sodium diet, less than 2000 mg daily.   Morbid obesity -Continue to  encourage weight loss.    Secondary pulmonary hypertension - Continue to monitor Follow-up in 3 months or sooner if needed    Floydene Flock, DO, [facility]  11/07/2022, 2:15 PM Office: [phone] Pager: [phone]

## 2022-11-08 ENCOUNTER — Encounter (HOSPITAL_BASED_OUTPATIENT_CLINIC_OR_DEPARTMENT_OTHER): Payer: Medicare PPO | Admitting: General Surgery

## 2022-11-08 DIAGNOSIS — Z8249 Family history of ischemic heart disease and other diseases of the circulatory system: Secondary | ICD-10-CM | POA: Diagnosis not present

## 2022-11-08 DIAGNOSIS — I890 Lymphedema, not elsewhere classified: Secondary | ICD-10-CM | POA: Diagnosis not present

## 2022-11-08 DIAGNOSIS — Z8673 Personal history of transient ischemic attack (TIA), and cerebral infarction without residual deficits: Secondary | ICD-10-CM | POA: Diagnosis not present

## 2022-11-08 DIAGNOSIS — L97522 Non-pressure chronic ulcer of other part of left foot with fat layer exposed: Secondary | ICD-10-CM | POA: Diagnosis not present

## 2022-11-08 DIAGNOSIS — Z9981 Dependence on supplemental oxygen: Secondary | ICD-10-CM | POA: Diagnosis not present

## 2022-11-08 DIAGNOSIS — E10621 Type 1 diabetes mellitus with foot ulcer: Secondary | ICD-10-CM | POA: Diagnosis not present

## 2022-11-08 DIAGNOSIS — E1042 Type 1 diabetes mellitus with diabetic polyneuropathy: Secondary | ICD-10-CM | POA: Diagnosis not present

## 2022-11-08 DIAGNOSIS — I5032 Chronic diastolic (congestive) heart failure: Secondary | ICD-10-CM | POA: Diagnosis not present

## 2022-11-08 DIAGNOSIS — I110 Hypertensive heart disease with heart failure: Secondary | ICD-10-CM | POA: Diagnosis not present

## 2022-11-08 DIAGNOSIS — S91302A Unspecified open wound, left foot, initial encounter: Secondary | ICD-10-CM | POA: Diagnosis not present

## 2022-11-08 NOTE — Progress Notes (Signed)
Lenahan, [name] ([id number]) [id number] Page 1 of 8 Visit Report for 11/08/2022 Arrival Information Details Patient Name: Date of Service: [name]. 11/08/2022 3:15 PM Medical Record Number: [id number] Patient Account Number: [id number] Date of Birth/Sex: Treating RN: [birth year] (71 y.o. F) Primary Care [name]: [name] Other Clinician: Referring [name]: Treating [name]/Extender: [name] in Treatment: 38 Visit Information History Since Last Visit All ordered tests and consults were completed: No Patient Arrived: Wheel Chair Added or deleted any medications: No Arrival Time: 15:34 Any new allergies or adverse reactions: No Accompanied By: self Had a fall or experienced change in No Transfer Assistance: None activities of daily living that may affect Patient Identification Verified: Yes risk of falls: Secondary Verification Process Completed: Yes Signs or symptoms of abuse/neglect since last visito No Patient Requires Transmission-Based Precautions: No Hospitalized since last visit: No Patient Has Alerts: Yes Implantable device outside of the clinic excluding No Patient Alerts: Patient on Blood 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 11/08/2022 3:15 PM Medical Record Number: [id number] Patient Account Number: [id number] Date of Birth/Sex: Treating RN: [birth year] (71 y.o. [name] Primary Care Giorgi Debruin: [name] Other Clinician: Referring [name]: Treating [name]/Extender: [name] in  Treatment: 31 Clinic Level of Care Assessment Items TOOL 4 Quantity Score X- 1 0 Use when only an EandM is performed on FOLLOW-UP visit ASSESSMENTS - Nursing Assessment / Reassessment X- 1 10 Reassessment of Co-morbidities (includes updates in patient status) X- 1 5 Reassessment of Adherence to Treatment Plan ASSESSMENTS - Wound and Skin A ssessment / Reassessment X - Simple Wound Assessment / Reassessment - one wound 1 5 '[]'$  - 0 Complex Wound Assessment / Reassessment - multiple wounds '[]'$  - 0 Dermatologic / Skin Assessment (not related to wound area) ASSESSMENTS - Focused Assessment '[]'$  - 0 Circumferential Edema Measurements - multi extremities '[]'$  - 0 Nutritional Assessment / Counseling / Intervention [name] ([id number]) [id number] Page 2 of 8 '[]'$  - 0 Lower Extremity Assessment (monofilament, tuning fork, pulses) '[]'$  - 0 Peripheral Arterial Disease Assessment (using hand held doppler) ASSESSMENTS - Ostomy and/or Continence Assessment and Care '[]'$  - 0 Incontinence Assessment and Management '[]'$  - 0 Ostomy Care Assessment and Management (repouching, etc.) PROCESS - Coordination of Care X - Simple Patient / Family Education for ongoing care 1 15 '[]'$  - 0 Complex (extensive) Patient / Family Education for ongoing care X- 1 10 Staff obtains Programmer, systems, Records, T Results / Process Orders est X- 1 10 Staff telephones HHA, Nursing Homes / Clarify orders / etc '[]'$  - 0 Routine Transfer to another Facility (non-emergent condition) '[]'$  - 0 Routine Hospital Admission (non-emergent condition) '[]'$  - 0 New Admissions / Biomedical engineer / Ordering NPWT Apligraf, etc. , '[]'$  - 0 Emergency Hospital Admission (emergent condition) X- 1 10 Simple Discharge Coordination '[]'$  - 0 Complex (extensive) Discharge Coordination PROCESS - Special Needs '[]'$  - 0 Pediatric / Minor Patient Management '[]'$  - 0 Isolation Patient Management '[]'$  - 0 Hearing / Language  / Visual special needs X- 1 15 Assessment of Community assistance (transportation, D/C planning, etc.) '[]'$  - 0 Additional assistance / Altered mentation '[]'$  - 0 Support Surface(s) Assessment (bed, cushion, seat, etc.) INTERVENTIONS -  Wound Cleansing / Measurement X - Simple Wound Cleansing - one wound 1 5 '[]'$  - 0 Complex Wound Cleansing - multiple wounds '[]'$  - 0 Wound Imaging (photographs - any number of wounds) X- 1 5 Wound Tracing (instead of photographs) X- 1 5 Simple Wound Measurement - one wound '[]'$  - 0 Complex Wound Measurement - multiple wounds INTERVENTIONS - Wound Dressings '[]'$  - 0 Small Wound Dressing one or multiple wounds '[]'$  - 0 Medium Wound Dressing one or multiple wounds '[]'$  - 0 Large Wound Dressing one or multiple wounds '[]'$  - 0 Application of Medications - topical '[]'$  - 0 Application of Medications - injection INTERVENTIONS - Miscellaneous '[]'$  - 0 External ear exam '[]'$  - 0 Specimen Collection (cultures, biopsies, blood, body fluids, etc.) '[]'$  - 0 Specimen(s) / Culture(s) sent or taken to Lab for analysis '[]'$  - 0 Patient Transfer (multiple staff / Civil Service fast streamer / Similar devices) '[]'$  - 0 Simple Staple / Suture removal (25 or less) '[]'$  - 0 Complex Staple / Suture removal (26 or more) '[]'$  - 0 Hypo / Hyperglycemic Management (close monitor of Blood Glucose) [name] ([id number]) [id number] Page 3 of 8 '[]'$  - 0 Ankle / Brachial Index (ABI) - do not check if billed separately X- 1 5 Vital Signs Has the patient been seen at the hospital within the last three years: Yes Total Score: 100 Level Of Care: New/Established - Level 3 Electronic Signature(s) Signed: 11/08/2022 5:50:46 PM By: [name] Catholic RN Entered By: [name] on 11/08/2022 17:47:32 -------------------------------------------------------------------------------- Encounter Discharge Information Details Patient Name: Date of Service: [name], [name] [place] [name]  11/08/2022 3:15 PM Medical Record Number: [id number] Patient Account Number: [id number] Date of Birth/Sex: Treating RN: 02/04/1951 (71 y.o. [name] Primary Care Jera Headings: [name] Other Clinician: Referring Montrel Donahoe: Treating Marilynn Ekstein/Extender: [name] in Treatment: 31 Encounter Discharge Information Items Discharge Condition: Stable Ambulatory Status: Wheelchair Discharge Destination: Home Transportation: Private Auto Accompanied By: self Schedule Follow-up Appointment: Yes Clinical Summary of Care: Patient Declined Electronic Signature(s) Signed: 11/08/2022 5:50:46 PM By: [name] Catholic RN Entered By: [name] on 11/08/2022 17:48:59 -------------------------------------------------------------------------------- Lower Extremity Assessment Details Patient Name: Date of Service: [name], [name] 11/08/2022 3:15 PM Medical Record Number: [id number] Patient Account Number: [id number] Date of Birth/Sex: Treating RN: 15-Jan-1951 (71 y.o. [name] Primary Care Zayana Salvador: [name] Other Clinician: Referring Ruford Dudzinski: Treating Ellen Mayol/Extender: [name] in Treatment: 31 Edema Assessment Assessed: Shirlyn Goltz: No] Patrice Paradise: No] [Left: Edema] [Right: :] Calf Left: Right: Point of Measurement: From Medial Instep 40.5 cm Ankle Left: Right: Point of Measurement: From Medial Instep 26 cm Vascular Assessment Zelaya, [name] ([id number]) [Right:[id number] Page 4 of 8] Pulses: Dorsalis Pedis Palpable: [Left:Yes] Electronic Signature(s) Signed: 11/08/2022 5:50:46 PM By: [name] Catholic RN Entered By: [name] on 11/08/2022 17:45:11 -------------------------------------------------------------------------------- Multi Wound Chart Details Patient Name: Date of Service: [name], [name] [place] [name] 11/08/2022 3:15 PM Medical Record Number: [id number] Patient  Account Number: [id number] Date of Birth/Sex: Treating RN: 02-Aug-1951 (71 y.o. F) Primary Care Hortensia Duffin: [name] Other Clinician: Referring Deslyn Cavenaugh: Treating Jhane Lorio/Extender: [name] in Treatment: 31 Vital Signs Height(in): 73 Capillary Blood Glucose(mg/dl): 143 Weight(lbs): 243 Pulse(bpm): 48 Body Mass Index(BMI): 38.1 Blood Pressure(mmHg): 151/84 Temperature(F): 97.5 Respiratory Rate(breaths/min): 18 [13:Photos:] [N/A:N/A] Left T Great oe N/A N/A Wound Location: Footwear Injury N/A N/A Wounding Event: Diabetic Wound/Ulcer of the Lower N/A N/A Primary Etiology: Extremity Cataracts, Anemia, Sleep Apnea, N/A N/A Comorbid History:  Congestive Heart Failure, Hypertension, Type I Diabetes, Received Radiation 09/06/2022 N/A N/A Date Acquired: 6 N/A N/A Weeks of Treatment: Open N/A N/A Wound Status: No N/A N/A Wound Recurrence: 0.1x0.1x0.1 N/A N/A Measurements L x W x D (cm) 0.008 N/A N/A A (cm) : rea 0.001 N/A N/A Volume (cm) : 98.70% N/A N/A % Reduction in A rea: 98.40% N/A N/A % Reduction in Volume: Grade 1 N/A N/A Classification: Medium N/A N/A Exudate A mount: Serosanguineous N/A N/A Exudate Type: red, [name] N/A N/A Exudate Color: Large (67-100%) N/A N/A Granulation A mount: Red N/A N/A Granulation Quality: Small (1-33%) N/A N/A Necrotic A mount: Eschar, Adherent Slough N/A N/A Necrotic Tissue: Fat Layer (Subcutaneous Tissue): Yes N/A N/A Exposed Structures: Fascia: No Tendon: No Muscle: No Joint: No Bone: No None N/A N/A Epithelialization: Maceration: Yes N/A N/A Periwound Skin Moisture: Dry/ScalyMargart Sickles, [name] ([id number]) [id number] Page 5 of 8 Treatment Notes Electronic Signature(s) Signed: 11/08/2022 4:36:57 PM By: Fredirick Maudlin MD FACS Entered By: Fredirick Maudlin on 11/08/2022  16:36:57 -------------------------------------------------------------------------------- Multi-Disciplinary Care Plan Details Patient Name: Date of Service: [name], [name] [place] [name] 11/08/2022 3:15 PM Medical Record Number: [id number] Patient Account Number: [id number] Date of Birth/Sex: Treating RN: May 17, 1951 (71 y.o. [name] Primary Care Pooja Camuso: [name] Other Clinician: Referring Chanley Mcenery: Treating Regine Christian/Extender: [name] in Treatment: [place] reviewed with physician Active Inactive Electronic Signature(s) Signed: 11/08/2022 5:50:46 PM By: [name] Catholic RN Entered By: [name] on 11/08/2022 17:46:18 -------------------------------------------------------------------------------- Pain Assessment Details Patient Name: Date of Service: [name] Salvo [place] [name] 11/08/2022 3:15 PM Medical Record Number: [id number] Patient Account Number: [id number] Date of Birth/Sex: Treating RN: [birth year] (71 y.o. F) Primary Care Viviano Bir: [name] Other Clinician: Referring Odena Mcquaid: Treating Connell Bognar/Extender: [name] in Treatment: 31 Active Problems Location of Pain Severity and Description of Pain Patient Has Paino No Site Locations Pain Management and Medication Krah, [name] ([id number]) [id number] Page 6 of 8 Current Pain Management: Electronic Signature(s) Signed: 11/08/2022 3:53:53 PM By: [name] Entered By: [name] on 11/08/2022 15:36:03 -------------------------------------------------------------------------------- Patient/Caregiver Education Details Patient Name: Date of Service: [name] Ducking. 11/15/2023andnbsp3:15 PM Medical Record Number: [id number] Patient Account Number: [id number] Date of Birth/Gender: Treating RN: December 01, 1951 (71 y.o. [name] Primary Care Physician: [name] Other  Clinician: Referring Physician: Treating Physician/Extender: [name] in Treatment: 31 Education Assessment Education Provided To: Patient Education Topics Provided Wound/Skin Impairment: Methods: Explain/Verbal Responses: Return demonstration correctly Electronic Signature(s) Signed: 11/08/2022 5:50:46 PM By: [name] Catholic RN Entered By: [name] on 11/08/2022 17:46:31 -------------------------------------------------------------------------------- Wound Assessment Details Patient Name: Date of Service: [name] Doom M. 11/08/2022 3:15 PM Medical Record Number: [id number] Patient Account Number: [id number] Date of Birth/Sex: Treating RN: 06-Jun-1951 (71 y.o. F) Primary Care Jerman Tinnon: [name] Other Clinician: Referring Kiernan Atkerson: Treating Journee Kohen/Extender: [name] in Treatment: 31 Wound Status Wound Number: 13 Primary Diabetic Wound/Ulcer of the Lower Extremity Etiology: Wound Location: Left T Great oe Wound Healed - Epithelialized Wounding Event: Footwear Injury Status: Date Acquired: 09/06/2022 Comorbid Cataracts, Anemia, Sleep Apnea, Congestive Heart Failure, Weeks Of Treatment: 6 History: Hypertension, Type I Diabetes, Received Radiation Clustered Wound: No Photos Brule, [name] ([id number]) [id number] Page 7 of 8 Wound Measurements Length: (cm) Width: (cm) Depth: (cm) Area: (cm) Volume: (cm) 0 % Reduction in Area: 100% 0 % Reduction in Volume: 100% 0 Epithelialization: Large (67-100%) 0 Tunneling: No 0  Undermining: No Wound Description Classification: Grade 1 Exudate Amount: Medium Exudate Type: Serosanguineous Exudate Color: red, [name] Foul Odor After Cleansing: No Slough/Fibrino No Wound Bed Granulation Amount: None Present (0%) Exposed Structure Necrotic Amount: None Present (0%) Fascia Exposed: No Fat Layer (Subcutaneous Tissue)  Exposed: No Tendon Exposed: No Muscle Exposed: No Joint Exposed: No Bone Exposed: No Periwound Skin Texture Texture Color No Abnormalities Noted: Yes No Abnormalities Noted: Yes Moisture Temperature / Pain No Abnormalities Noted: Yes Temperature: No Abnormality Electronic Signature(s) Signed: 11/08/2022 5:50:46 PM By: [name] Catholic RN Previous Signature: 11/08/2022 3:53:53 PM Version By: [name] Entered By: [name] on 11/08/2022 17:45:54 -------------------------------------------------------------------------------- Vitals Details Patient Name: Date of Service: [name], [name] 11/08/2022 3:15 PM Medical Record Number: [id number] Patient Account Number: [id number] Date of Birth/Sex: Treating RN: 1951/05/16 (71 y.o. F) Primary Care Finneus Kaneshiro: [name] Other Clinician: Referring Berthold Glace: Treating Felcia Huebert/Extender: [name] in Treatment: 31 Vital Signs Time Taken: 03:30 Temperature (F): 97.5 Height (in): 67 Pulse (bpm): 52 Weight (lbs): 243 Respiratory Rate (breaths/min): 18 Body Mass Index (BMI): 38.1 Blood Pressure (mmHg): 151/84 Capillary Blood Glucose (mg/dl): 143 Reference Range: 80 - 120 mg / dl Electronic Signature(s) Horst, [name] ([id number]) [id number] Page 8 of 8 Signed: 11/08/2022 3:53:53 PM By: [name] Entered By: [name] on 11/08/2022 15:35:57

## 2022-11-08 NOTE — Progress Notes (Signed)
Reier, [name] Estrella [name] ([id number]) [id number] Page 1 of 9 Visit Report for 11/08/2022 Chief Complaint Document Details Patient Name: Date of Service: [place], [name] 11/08/2022 3:15 PM Medical Record Number: [id number] Patient Account Number: [id number] Date of Birth/Sex: Treating RN: 22-Jan-1951 (71 y.o. F) Primary Care Provider: Jim Like Other Clinician: Referring Provider: Treating Provider/Extender: Rory Percy in Treatment: 31 Information Obtained from: Patient Chief Complaint Patient presents for treatment of open ulcers due to venous insufficiency in the setting of poorly controlled DM1 and CHF Electronic Signature(s) Signed: 11/08/2022 4:37:31 PM By: Fredirick Maudlin MD FACS Entered By: Fredirick Maudlin on 11/08/2022 16:37:31 -------------------------------------------------------------------------------- HPI Details Patient Name: Date of Service: [name], [name] 11/08/2022 3:15 PM Medical Record Number: [id number] Patient Account Number: [id number] Date of Birth/Sex: Treating RN: May 17, 1951 (71 y.o. F) Primary Care Provider: Jim Like Other Clinician: Referring Provider: Treating Provider/Extender: Rory Percy in Treatment: 56 History of Present Illness HPI Description: ADMISSION 04/03/2022 This is a 71 year old woman with a past medical history notable for type 1 diabetes mellitus, poorly controlled with her most recent A1c being 8.3, congestive heart failure with recent admission for exacerbation secondary to noncompliance with diuretics, as well as history of pontine stroke and oxygen dependence. She has wounds on her bilateral lower extremities. She says they have been present for about 2 months and initially started as blisters. She says that she has compression stockings, but does not wear them because they are too difficult to get on. ABIs done in clinic were 1.4 and  1.5, suggesting some degree of noncompressibility. She also has a wound on her right heel that she says is secondary to it getting bumped frequently as she transfers. She came to clinic with just dry dressings on her legs. She does have home health care, as well as her sister that have been assisting her with her dressings. She has multiple wounds. On the left medial calf, just above her ankle, there is a somewhat geographic wound with slough and some epithelialization. On the anterior tibial surface and lateral calf, the wounds are quite desiccated with yellow eschar present. On the right anterior tibial surface, the wounds are gritty with thin slough and eschar. The posterior right ankle wound is just above the calcaneus overlying the Achilles and has thick yellow-gray slough and a bit of odor. No purulent drainage appreciated. 04/10/2022: The wounds on her left leg are closed. On the right, the anterior tibial surface wounds are epithelializing and much cleaner. There is just a layer of biofilm on the open areas. On the posterior right ankle wound, there is a decreased volume of slough and no odor. We have been using Hydrofera Blue to all sites under compression. 04/17/2022: The wounds on her left leg remained closed and she did bring her juxta lite stockings with her today. On the right, the more distal wound is closed and the proximal wound has epithelialized significantly since her last visit. At the posterior right ankle, she still has accumulated some slough and some periwound callus but there is no odor or significant drainage. 04/24/2022: The right anterior leg wounds are closed. She still has a wound on her posterior right ankle that looks about the same as last week. She did not bring her right sided juxta light with her today but is wearing the left sided stocking. 05/01/2022: She has a tiny abrasion on her left anterior tibia which we did not add to her wound count today.  The wound on her  posterior right ankle is a little bit smaller with a little accumulated slough. No drainage or concern for infection. 05/08/2022: She continues to forget to bring her juxta lite stocking with her so she remains in a compression wrap despite healing of the right anterior leg wounds. The wound on her posterior right ankle/calcaneus is a bit smaller today with just a little bit of accumulated slough. She is wearing a juxta lite stocking on the left leg. [name], [name] Estrella [name] ([id number]) [id number] Page 2 of 9 05/15/2022: She says that she has been wearing her juxta lite stockings at home, but today she is wearing regular compression stockings that she purchased from [employer]; the juxta lite stockings are in the laundry. The wound on her posterior right ankle/calcaneus continues to contract and has just a little bit of accumulated slough. Unfortunately, her edema control is suboptimal on the left and she has developed several small blisters with a larger 1 at the medial calf. 05/23/2022: The patient has been wearing her juxta lite stockings. Unfortunately, she has developed a large blister on her right lower leg just above the ankle. The skin overlying it is still intact. The blister that had opened on her left medial calf is smaller; and the other small blistered sites are closed. The posterior right ankle/calcaneus is about the same today. 06/07/2022: The only remaining open wound is on her posterior right calcaneus. The majority of this is covered with eschar. 06/30/2022: I am not sure why the patient did not return to clinic until today, but the ulcer on her right heel has healed. Unfortunately, she has developed a new ulcer on her left lateral foot. From the position of it, it looks as though the outward rotation of her lower leg at rest is probably causing the lateral midfoot to rub or press against the metal leg rests of her wheelchair. She is not padding the chair when she  rests her leg on the leg rests. The new ulcer is fairly superficial with just the fat layer exposed. It is quite tender. No erythema, induration, or purulent drainage identified. 07/12/2022: After our last visit, she was seen by podiatry. Nail care was performed. For some reason he had her apply silicone toe covers on her bilateral great toes. As result, the skin is now very macerated and she has a new ulcer on the knuckle of her right great toe. It is fairly small but does expose the fat layer. No erythema induration or purulent drainage although there was some odor on her feet when the caps were removed. The wound on her left lateral foot is smaller with just a small amount of slough buildup. Her Prevalon boots arrived and we will put her in those after her dressing is changed. 08/02/2022: She has been wearing her Prevalon boots to some extent, but says that she spends most of her day in her wheelchair and the boots slide off the foot rest. She apparently has not been utilizing the leg strap. The left lateral foot wound is tender and the tissue has some discoloration suggestive of ongoing pressure in this area. There is a little bit of callus accumulation circumferentially. The wound on her right great toe DIP joint is nearly closed and just has a light layer of eschar overlying the surface. No concern for infection. 08/16/2022: The wound on her right great toe has healed. The wound on her left lateral foot is quite a bit smaller with good perimeter epithelialization and  a light layer of slough on the surface. 08/30/2022: Today she is complaining of a lot more pain on her left lateral foot. I think we are struggling with adequate padding and prevention of pressure in the site. The wound opening itself is quite small, but there is circumferential undermining from old skin. There is no odor or purulent drainage. 09/13/2022: The lateral foot wound looks significantly improved today. It is smaller and I no  longer detect the undermining. 09/27/2022: The lateral foot wound has healed. Unfortunately, she has developed a new wound that appears to be secondary to friction on her left great toe dorsal knuckle. There is a little bit of slough present but no concern for infection. 10/11/2022: The wound on her left great toe is half the size as it was last week. There is light slough and some periwound eschar. No concern for infection. 10/25/2022: The wound on her left great toe is almost completely healed; there is just a tiny open area under some callus and eschar. 11/08/2022: Her wound is healed. Electronic Signature(s) Signed: 11/08/2022 4:38:59 PM By: Fredirick Maudlin MD FACS Entered By: Fredirick Maudlin on 11/08/2022 16:38:59 -------------------------------------------------------------------------------- Physical Exam Details Patient Name: Date of Service: [name], [name] 11/08/2022 3:15 PM Medical Record Number: [id number] Patient Account Number: [id number] Date of Birth/Sex: Treating RN: 25-Feb-1951 (71 y.o. F) Primary Care Provider: Jim Like Other Clinician: Referring Provider: Treating Provider/Extender: Rory Percy in Treatment: 31 Constitutional Hypertensive, asymptomatic. Bradycardic, asymptomatic.. . . No acute distress.Marland Kitchen Respiratory Normal work of breathing on supplemental oxygen.. Notes 11/08/2022: Her wound is healed. Electronic Signature(s) Signed: 11/08/2022 4:39:42 PM By: Fredirick Maudlin MD FACS Entered By: Fredirick Maudlin on 11/08/2022 16:39:42 [place], [name] Estrella [name] ([id number]) [id number] Page 3 of 9 -------------------------------------------------------------------------------- Physician Orders Details Patient Name: Date of Service: AVERLEE, SWARTZ. 11/08/2022 3:15 PM Medical Record Number: [id number] Patient Account Number: [id number] Date of Birth/Sex: Treating RN: 12-Mar-1951 (71 y.o. F) Primary  Care Provider: Jim Like Other Clinician: Referring Provider: Treating Provider/Extender: Rory Percy in Treatment: 31 Verbal / Phone Orders: No Diagnosis Coding ICD-10 Coding Code Description I89.0 Lymphedema, not elsewhere classified D62.22 Chronic diastolic (congestive) heart failure E10.59 Type 1 diabetes mellitus with other circulatory complications L79.89 Type 1 diabetes mellitus with diabetic polyneuropathy Z68.41 Body mass index [BMI]40.0-44.9, adult E10.622 Type 1 diabetes mellitus with other skin ulcer L97.522 Non-pressure chronic ulcer of other part of left foot with fat layer exposed Discharge From [facility] Services Discharge from [place] your wound is healed! Electronic Signature(s) Signed: 11/08/2022 5:50:46 PM By: Dellie Catholic RN Signed: 11/09/2022 7:46:48 AM By: Fredirick Maudlin MD FACS Previous Signature: 11/08/2022 4:43:56 PM Version By: Fredirick Maudlin MD FACS Entered By: Dellie Catholic on 11/08/2022 17:48:30 -------------------------------------------------------------------------------- Problem List Details Patient Name: Date of Service: Dara [name], [name] [place] [name] 11/08/2022 3:15 PM Medical Record Number: [id number] Patient Account Number: [id number] Date of Birth/Sex: Treating RN: 07-31-1951 (71 y.o. F) Primary Care Provider: Jim Like Other Clinician: Referring Provider: Treating Provider/Extender: Rory Percy in Treatment: 31 Active Problems ICD-10 Encounter Code Description Active Date MDM Diagnosis I89.0 Lymphedema, not elsewhere classified 04/03/2022 No Yes C14.48 Chronic diastolic (congestive) heart failure 04/03/2022 No Yes E10.59 Type 1 diabetes mellitus with other circulatory complications 1/85/6314 No Yes E10.42 Type 1 diabetes mellitus with diabetic polyneuropathy 04/03/2022 No Yes Carline, [name] Estrella [name] ([id number]) [id number] Page 4 of  9 Z68.41 Body mass index [BMI]40.0-44.9, adult 04/03/2022 No Yes Y85.027  Type 1 diabetes mellitus with other skin ulcer 04/03/2022 No Yes L97.522 Non-pressure chronic ulcer of other part of left foot with fat layer exposed 07/12/2022 No Yes Inactive Problems ICD-10 Code Description Active Date Inactive Date L97.212 Non-pressure chronic ulcer of right calf with fat layer exposed 04/03/2022 04/03/2022 E83.151 Non-pressure chronic ulcer of left calf with fat layer exposed 04/03/2022 04/03/2022 L97.312 Non-pressure chronic ulcer of right ankle with fat layer exposed 04/03/2022 04/03/2022 L97.821 Non-pressure chronic ulcer of other part of left lower leg limited to breakdown of skin 05/15/2022 05/15/2022 L97.422 Non-pressure chronic ulcer of left heel and midfoot with fat layer exposed 06/30/2022 06/30/2022 L97.811 Non-pressure chronic ulcer of other part of right lower leg limited to breakdown of skin 05/23/2022 05/23/2022 Resolved Problems Electronic Signature(s) Signed: 11/08/2022 4:30:53 PM By: Fredirick Maudlin MD FACS Entered By: Fredirick Maudlin on 11/08/2022 16:30:53 -------------------------------------------------------------------------------- Progress Note Details Patient Name: Date of Service: [name], [name] 11/08/2022 3:15 PM Medical Record Number: [id number] Patient Account Number: [id number] Date of Birth/Sex: Treating RN: 02/21/1951 (71 y.o. F) Primary Care Provider: Jim Like Other Clinician: Referring Provider: Treating Provider/Extender: Rory Percy in Treatment: 31 Subjective Chief Complaint Information obtained from Patient Patient presents for treatment of open ulcers due to venous insufficiency in the setting of poorly controlled DM1 and CHF History of Present Illness (HPI) ADMISSION 04/03/2022 This is a 71 year old woman with a past medical history notable for type 1 diabetes mellitus, poorly controlled with her most recent A1c being 8.3,  congestive heart failure with recent admission for exacerbation secondary to noncompliance with diuretics, as well as history of pontine stroke and oxygen dependence. She has wounds on her bilateral lower extremities. She says they have been present for about 2 months and initially started as blisters. She says that she has compression stockings, but does not wear them because they are too difficult to get on. ABIs done in clinic were 1.4 and 1.5, suggesting some degree of noncompressibility. She also has a wound on her right heel that she says is secondary to it getting bumped frequently as she transfers. She came to clinic with PRITZ, [name] Estrella [name] ([id number]) [id number] Page 5 of 9 just dry dressings on her legs. She does have home health care, as well as her sister that have been assisting her with her dressings. She has multiple wounds. On the left medial calf, just above her ankle, there is a somewhat geographic wound with slough and some epithelialization. On the anterior tibial surface and lateral calf, the wounds are quite desiccated with yellow eschar present. On the right anterior tibial surface, the wounds are gritty with thin slough and eschar. The posterior right ankle wound is just above the calcaneus overlying the Achilles and has thick yellow-gray slough and a bit of odor. No purulent drainage appreciated. 04/10/2022: The wounds on her left leg are closed. On the right, the anterior tibial surface wounds are epithelializing and much cleaner. There is just a layer of biofilm on the open areas. On the posterior right ankle wound, there is a decreased volume of slough and no odor. We have been using Hydrofera Blue to all sites under compression. 04/17/2022: The wounds on her left leg remained closed and she did bring her juxta lite stockings with her today. On the right, the more distal wound is closed and the proximal wound has epithelialized significantly  since her last visit. At the posterior right ankle, she still has accumulated some slough and some periwound callus  but there is no odor or significant drainage. 04/24/2022: The right anterior leg wounds are closed. She still has a wound on her posterior right ankle that looks about the same as last week. She did not bring her right sided juxta light with her today but is wearing the left sided stocking. 05/01/2022: She has a tiny abrasion on her left anterior tibia which we did not add to her wound count today. The wound on her posterior right ankle is a little bit smaller with a little accumulated slough. No drainage or concern for infection. 05/08/2022: She continues to forget to bring her juxta lite stocking with her so she remains in a compression wrap despite healing of the right anterior leg wounds. The wound on her posterior right ankle/calcaneus is a bit smaller today with just a little bit of accumulated slough. She is wearing a juxta lite stocking on the left leg. 05/15/2022: She says that she has been wearing her juxta lite stockings at home, but today she is wearing regular compression stockings that she purchased from [employer]; the juxta lite stockings are in the laundry. The wound on her posterior right ankle/calcaneus continues to contract and has just a little bit of accumulated slough. Unfortunately, her edema control is suboptimal on the left and she has developed several small blisters with a larger 1 at the medial calf. 05/23/2022: The patient has been wearing her juxta lite stockings. Unfortunately, she has developed a large blister on her right lower leg just above the ankle. The skin overlying it is still intact. The blister that had opened on her left medial calf is smaller; and the other small blistered sites are closed. The posterior right ankle/calcaneus is about the same today. 06/07/2022: The only remaining open wound is on her posterior right calcaneus. The majority of this is  covered with eschar. 06/30/2022: I am not sure why the patient did not return to clinic until today, but the ulcer on her right heel has healed. Unfortunately, she has developed a new ulcer on her left lateral foot. From the position of it, it looks as though the outward rotation of her lower leg at rest is probably causing the lateral midfoot to rub or press against the metal leg rests of her wheelchair. She is not padding the chair when she rests her leg on the leg rests. The new ulcer is fairly superficial with just the fat layer exposed. It is quite tender. No erythema, induration, or purulent drainage identified. 07/12/2022: After our last visit, she was seen by podiatry. Nail care was performed. For some reason he had her apply silicone toe covers on her bilateral great toes. As result, the skin is now very macerated and she has a new ulcer on the knuckle of her right great toe. It is fairly small but does expose the fat layer. No erythema induration or purulent drainage although there was some odor on her feet when the caps were removed. The wound on her left lateral foot is smaller with just a small amount of slough buildup. Her Prevalon boots arrived and we will put her in those after her dressing is changed. 08/02/2022: She has been wearing her Prevalon boots to some extent, but says that she spends most of her day in her wheelchair and the boots slide off the foot rest. She apparently has not been utilizing the leg strap. The left lateral foot wound is tender and the tissue has some discoloration suggestive of ongoing pressure in this area.  There is a little bit of callus accumulation circumferentially. The wound on her right great toe DIP joint is nearly closed and just has a light layer of eschar overlying the surface. No concern for infection. 08/16/2022: The wound on her right great toe has healed. The wound on her left lateral foot is quite a bit smaller with good perimeter epithelialization  and a light layer of slough on the surface. 08/30/2022: Today she is complaining of a lot more pain on her left lateral foot. I think we are struggling with adequate padding and prevention of pressure in the site. The wound opening itself is quite small, but there is circumferential undermining from old skin. There is no odor or purulent drainage. 09/13/2022: The lateral foot wound looks significantly improved today. It is smaller and I no longer detect the undermining. 09/27/2022: The lateral foot wound has healed. Unfortunately, she has developed a new wound that appears to be secondary to friction on her left great toe dorsal knuckle. There is a little bit of slough present but no concern for infection. 10/11/2022: The wound on her left great toe is half the size as it was last week. There is light slough and some periwound eschar. No concern for infection. 10/25/2022: The wound on her left great toe is almost completely healed; there is just a tiny open area under some callus and eschar. 11/08/2022: Her wound is healed. Patient History Information obtained from Patient. Family History Heart Disease - Mother, Hypertension - Mother,Father, Stroke - Child. Social History Never smoker, Marital Status - Divorced, Alcohol Use - Never, Drug Use - No History, Caffeine Use - Never. Medical History Eyes Patient has history of Cataracts Hematologic/Lymphatic Patient has history of Anemia Respiratory Patient has history of Sleep Apnea Cardiovascular Patient has history of Congestive Heart Failure, Hypertension Endocrine Patient has history of Type I Diabetes Oncologic Patient has history of Received Radiation Hospitalization/Surgery History - pacemaker implant. - loop recorder insertion and removal. - esophagogastroduodenoscopy. - TEE without cardioversion. - Bahner, [name] Estrella [name] ([id number]) [id number] Page 6 of 9 colonoscopy. - left and right heart catheterization. -  abdominal angiogram. - thyroidectomy. - abdominal hysterectomy. - ankle fracture surgery. Medical A Surgical History Notes nd Ear/Nose/Mouth/Throat edentulous, GERD, seasonal allergies, trouble swallowing Hematologic/Lymphatic hyperlipidemia Cardiovascular complete heart block, bradycardia, paroxysmal atrial fibrillation, pacemaker Gastrointestinal hemorrhoids Endocrine hypothyroidism Genitourinary proteinuria, frequent UTIs Musculoskeletal decreased ROM L shoulder, hemiparesis affecting L side, arthritis Neurologic benign paroxysmal positional vertigo, intracranial vascular stenosis, lacunar infarction, stroke Oncologic thyroid cancer Psychiatric depression Objective Constitutional Hypertensive, asymptomatic. Bradycardic, asymptomatic.Marland Kitchen No acute distress.. Vitals Time Taken: 3:30 AM, Height: 67 in, Weight: 243 lbs, BMI: 38.1, Temperature: 97.5 F, Pulse: 52 bpm, Respiratory Rate: 18 breaths/min, Blood Pressure: 151/84 mmHg, Capillary Blood Glucose: 143 mg/dl. Respiratory Normal work of breathing on supplemental oxygen.. General Notes: 11/08/2022: Her wound is healed. Integumentary (Hair, Skin) Wound #13 status is Open. Original cause of wound was Footwear Injury. The date acquired was: 09/06/2022. The wound has been in treatment 6 weeks. The wound is located on the Left T Great. The wound measures 0.1cm length x 0.1cm width x 0.1cm depth; 0.008cm^2 area and 0.001cm^3 volume. There is Fat oe Layer (Subcutaneous Tissue) exposed. There is a medium amount of serosanguineous drainage noted. There is large (67-100%) red granulation within the wound bed. There is a small (1-33%) amount of necrotic tissue within the wound bed including Eschar and Adherent Slough. The periwound skin appearance exhibited: Dry/Scaly, Maceration. Assessment Active Problems ICD-10 Lymphedema, not elsewhere  classified Chronic diastolic (congestive) heart failure Type 1 diabetes mellitus with other  circulatory complications Type 1 diabetes mellitus with diabetic polyneuropathy Body mass index [BMI]40.0-44.9, adult Type 1 diabetes mellitus with other skin ulcer Non-pressure chronic ulcer of other part of left foot with fat layer exposed Plan 11/08/2022: Her wound is healed. Because it it is in an area that is at risk for friction and future breakdown, I recommended that she keep the area well padded and avoid snug fitting shoes. We will discharge her from the wound care center. She may follow-up as needed. Electronic Signature(s) Signed: 11/08/2022 4:46:19 PM By: Fredirick Maudlin MD FACS Rue, [name] Estrella [name] ([id number]) [id number] Page 7 of 9 Entered By: Fredirick Maudlin on 11/08/2022 16:46:19 -------------------------------------------------------------------------------- HxROS Details Patient Name: Date of Service: [name], [name] 11/08/2022 3:15 PM Medical Record Number: [id number] Patient Account Number: [id number] Date of Birth/Sex: Treating RN: [birth year] (71 y.o. F) Primary Care Provider: Jim Like Other Clinician: Referring Provider: Treating Provider/Extender: Rory Percy in Treatment: 31 Information Obtained From Patient Eyes Medical History: Positive for: Cataracts Ear/Nose/Mouth/Throat Medical History: Past Medical History Notes: edentulous, GERD, seasonal allergies, trouble swallowing Hematologic/Lymphatic Medical History: Positive for: Anemia Past Medical History Notes: hyperlipidemia Respiratory Medical History: Positive for: Sleep Apnea Cardiovascular Medical History: Positive for: Congestive Heart Failure; Hypertension Past Medical History Notes: complete heart block, bradycardia, paroxysmal atrial fibrillation, pacemaker Gastrointestinal Medical History: Past Medical History Notes: hemorrhoids Endocrine Medical History: Positive for: Type I Diabetes Past Medical History  Notes: hypothyroidism Time with diabetes: 81 yrs Treated with: Insulin Blood sugar tested every day: Yes Tested : on pump Genitourinary Medical History: Past Medical History Notes: proteinuria, frequent UTIs Musculoskeletal Medical History: Past Medical History Notes: decreased ROM L shoulder, hemiparesis affecting L side, arthritis Woo, [name] Estrella [name] ([id number]) [id number] Page 8 of 9 Neurologic Medical History: Past Medical History Notes: benign paroxysmal positional vertigo, intracranial vascular stenosis, lacunar infarction, stroke Oncologic Medical History: Positive for: Received Radiation Past Medical History Notes: thyroid cancer Psychiatric Medical History: Past Medical History Notes: depression HBO Extended History Items Eyes: Cataracts Immunizations Pneumococcal Vaccine: Received Pneumococcal Vaccination: Yes Received Pneumococcal Vaccination On or After 60th Birthday: Yes Implantable Devices Yes Hospitalization / Surgery History Type of Hospitalization/Surgery pacemaker implant loop recorder insertion and removal esophagogastroduodenoscopy TEE without cardioversion colonoscopy left and right heart catheterization abdominal angiogram thyroidectomy abdominal hysterectomy ankle fracture surgery Family and Social History Heart Disease: Yes - Mother; Hypertension: Yes - Mother,Father; Stroke: Yes - Child; Never smoker; Marital Status - Divorced; Alcohol Use: Never; Drug Use: No History; Caffeine Use: Never; Financial Concerns: No; Food, Clothing or Shelter Needs: No; Support System Lacking: No; Transportation Concerns: No Electronic Signature(s) Signed: 11/08/2022 5:18:05 PM By: Fredirick Maudlin MD FACS Entered By: Fredirick Maudlin on 11/08/2022 16:39:06 -------------------------------------------------------------------------------- SuperBill Details Patient Name: Date of Service: Dara [name], [name]  11/08/2022 Medical Record Number: [id number] Patient Account Number: [id number] Date of Birth/Sex: Treating RN: 03-03-1951 (71 y.o. 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 Z68.41 Body mass index [BMI]40.0-44.9, adult E10.622 Type 1 diabetes mellitus with other skin ulcer L97.522 Non-pressure chronic ulcer of other part of left foot with fat  layer exposed Facility Procedures : CPT4 Code: 69450388 Description: 82800 - WOUND CARE VISIT-LEV 3 EST PT Modifier: Quantity: 1 Physician Procedures : CPT4 Code Description Modifier [id number] - WC PHYS LEVEL 3 - EST PT ICD-10 Diagnosis Description L97.522 Non-pressure chronic ulcer of other part of left foot with fat layer exposed E10.622 Type 1 diabetes mellitus with other skin ulcer V94.80  Chronic diastolic (congestive) heart failure I89.0 Lymphedema, not elsewhere classified Quantity: 1 Electronic Signature(s) Signed: 11/08/2022 5:50:46 PM By: Dellie Catholic RN Signed: 11/09/2022 7:46:48 AM By: Fredirick Maudlin MD FACS Previous Signature: 11/08/2022 4:46:41 PM Version By: Fredirick Maudlin MD FACS Entered By: Dellie Catholic on 11/08/2022 17:47:51

## 2022-11-20 ENCOUNTER — Telehealth: Payer: Self-pay | Admitting: Student

## 2022-11-20 NOTE — Telephone Encounter (Signed)
Patient called stating she is needing her oxygen renewed. She stated it is through [facility]. She said it is needing to be renewed but the end of the year, but wants to go ahead and get it taken care of. PCP is Joelyn Oms but was signed and approved by Dr. Erin Hearing.   Please Advise.  Thanks!

## 2022-12-04 ENCOUNTER — Ambulatory Visit: Payer: Self-pay | Admitting: Licensed Clinical Social Worker

## 2022-12-04 NOTE — Patient Outreach (Signed)
SW removed self from care team on today.

## 2022-12-06 DIAGNOSIS — Z794 Long term (current) use of insulin: Secondary | ICD-10-CM | POA: Diagnosis not present

## 2022-12-06 DIAGNOSIS — E890 Postprocedural hypothyroidism: Secondary | ICD-10-CM | POA: Diagnosis not present

## 2022-12-06 DIAGNOSIS — E7800 Pure hypercholesterolemia, unspecified: Secondary | ICD-10-CM | POA: Diagnosis not present

## 2022-12-06 DIAGNOSIS — I10 Essential (primary) hypertension: Secondary | ICD-10-CM | POA: Diagnosis not present

## 2022-12-06 DIAGNOSIS — I639 Cerebral infarction, unspecified: Secondary | ICD-10-CM | POA: Diagnosis not present

## 2022-12-06 DIAGNOSIS — Z9641 Presence of insulin pump (external) (internal): Secondary | ICD-10-CM | POA: Diagnosis not present

## 2022-12-06 DIAGNOSIS — E11319 Type 2 diabetes mellitus with unspecified diabetic retinopathy without macular edema: Secondary | ICD-10-CM | POA: Diagnosis not present

## 2022-12-06 DIAGNOSIS — E109 Type 1 diabetes mellitus without complications: Secondary | ICD-10-CM | POA: Diagnosis not present

## 2022-12-06 DIAGNOSIS — C73 Malignant neoplasm of thyroid gland: Secondary | ICD-10-CM | POA: Diagnosis not present

## 2022-12-07 ENCOUNTER — Other Ambulatory Visit: Payer: Self-pay

## 2022-12-08 DIAGNOSIS — E108 Type 1 diabetes mellitus with unspecified complications: Secondary | ICD-10-CM | POA: Diagnosis not present

## 2022-12-11 ENCOUNTER — Other Ambulatory Visit: Payer: Self-pay | Admitting: Student

## 2022-12-11 DIAGNOSIS — E890 Postprocedural hypothyroidism: Secondary | ICD-10-CM

## 2022-12-15 ENCOUNTER — Other Ambulatory Visit: Payer: Self-pay | Admitting: Family Medicine

## 2022-12-15 DIAGNOSIS — F339 Major depressive disorder, recurrent, unspecified: Secondary | ICD-10-CM

## 2022-12-25 ENCOUNTER — Other Ambulatory Visit: Payer: Self-pay | Admitting: Student

## 2022-12-26 ENCOUNTER — Other Ambulatory Visit: Payer: Self-pay | Admitting: Family Medicine

## 2022-12-27 ENCOUNTER — Encounter (HOSPITAL_BASED_OUTPATIENT_CLINIC_OR_DEPARTMENT_OTHER): Payer: Medicare PPO | Admitting: General Surgery

## 2022-12-29 DIAGNOSIS — Z794 Long term (current) use of insulin: Secondary | ICD-10-CM | POA: Diagnosis not present

## 2022-12-29 DIAGNOSIS — E109 Type 1 diabetes mellitus without complications: Secondary | ICD-10-CM | POA: Diagnosis not present

## 2023-01-01 ENCOUNTER — Encounter (HOSPITAL_BASED_OUTPATIENT_CLINIC_OR_DEPARTMENT_OTHER): Payer: Medicare Other | Attending: General Surgery | Admitting: General Surgery

## 2023-01-01 DIAGNOSIS — L97811 Non-pressure chronic ulcer of other part of right lower leg limited to breakdown of skin: Secondary | ICD-10-CM | POA: Diagnosis not present

## 2023-01-01 DIAGNOSIS — I110 Hypertensive heart disease with heart failure: Secondary | ICD-10-CM | POA: Insufficient documentation

## 2023-01-01 DIAGNOSIS — S91302A Unspecified open wound, left foot, initial encounter: Secondary | ICD-10-CM | POA: Diagnosis not present

## 2023-01-01 DIAGNOSIS — S91301A Unspecified open wound, right foot, initial encounter: Secondary | ICD-10-CM | POA: Diagnosis not present

## 2023-01-01 DIAGNOSIS — I480 Paroxysmal atrial fibrillation: Secondary | ICD-10-CM | POA: Insufficient documentation

## 2023-01-01 DIAGNOSIS — L97422 Non-pressure chronic ulcer of left heel and midfoot with fat layer exposed: Secondary | ICD-10-CM | POA: Insufficient documentation

## 2023-01-01 DIAGNOSIS — E10622 Type 1 diabetes mellitus with other skin ulcer: Secondary | ICD-10-CM | POA: Diagnosis not present

## 2023-01-01 DIAGNOSIS — I872 Venous insufficiency (chronic) (peripheral): Secondary | ICD-10-CM | POA: Diagnosis not present

## 2023-01-01 DIAGNOSIS — E785 Hyperlipidemia, unspecified: Secondary | ICD-10-CM | POA: Insufficient documentation

## 2023-01-01 DIAGNOSIS — E1042 Type 1 diabetes mellitus with diabetic polyneuropathy: Secondary | ICD-10-CM | POA: Insufficient documentation

## 2023-01-01 DIAGNOSIS — I5032 Chronic diastolic (congestive) heart failure: Secondary | ICD-10-CM | POA: Insufficient documentation

## 2023-01-01 DIAGNOSIS — Z9981 Dependence on supplemental oxygen: Secondary | ICD-10-CM | POA: Insufficient documentation

## 2023-01-01 DIAGNOSIS — L97222 Non-pressure chronic ulcer of left calf with fat layer exposed: Secondary | ICD-10-CM | POA: Insufficient documentation

## 2023-01-01 DIAGNOSIS — I890 Lymphedema, not elsewhere classified: Secondary | ICD-10-CM | POA: Diagnosis not present

## 2023-01-01 DIAGNOSIS — L97522 Non-pressure chronic ulcer of other part of left foot with fat layer exposed: Secondary | ICD-10-CM | POA: Insufficient documentation

## 2023-01-01 DIAGNOSIS — Z8673 Personal history of transient ischemic attack (TIA), and cerebral infarction without residual deficits: Secondary | ICD-10-CM | POA: Insufficient documentation

## 2023-01-01 DIAGNOSIS — L97312 Non-pressure chronic ulcer of right ankle with fat layer exposed: Secondary | ICD-10-CM | POA: Insufficient documentation

## 2023-01-01 DIAGNOSIS — L97212 Non-pressure chronic ulcer of right calf with fat layer exposed: Secondary | ICD-10-CM | POA: Diagnosis not present

## 2023-01-01 DIAGNOSIS — L97821 Non-pressure chronic ulcer of other part of left lower leg limited to breakdown of skin: Secondary | ICD-10-CM | POA: Insufficient documentation

## 2023-01-01 NOTE — Progress Notes (Signed)
[place], [name] ([id number]) [id number] Page 1 of 6 Visit Report for 01/01/2023 Arrival Information Details Patient Name: Date of Service: HAMM, [name] 01/01/2023 2:45 PM Medical Record Number: [id number] Patient Account Number: [id number] Date of Birth/Sex: Treating RN: [birth year] (72 y.o. F) Primary Care Augustino Savastano: Jim Like Other Clinician: Referring Torina Ey: Treating [name] [name]/Extender: Rory Percy in Treatment: 39 Visit Information History Since Last Visit All ordered tests and consults were completed: No Patient Arrived: Wheel Chair Added or deleted any medications: No Arrival Time: 14:45 Any new allergies or adverse reactions: No Accompanied By: self Had a fall or experienced change in No Transfer Assistance: Manual activities of daily living that may affect Patient Identification Verified: Yes risk of falls: Secondary Verification Process Completed: Yes Signs or symptoms of abuse/neglect since last visito No Patient Requires Transmission-Based Precautions: No Hospitalized since last visit: No Patient Has Alerts: Yes Implantable device outside of the clinic excluding No Patient Alerts: Patient on Blood Thinner cellular tissue based products placed in the center pacemaker since last visit: Pain Present Now: No Electronic Signature(s) Signed: 01/01/2023 3:46:17 PM By: Worthy Rancher Entered By: Worthy Rancher on 01/01/2023 14:45:40 -------------------------------------------------------------------------------- Clinic Level of Care Assessment Details Patient Name: Date of Service: [name], [name] [name] 01/01/2023 2:45 PM Medical Record Number: [id number] Patient Account Number: [id number] Date of Birth/Sex: Treating RN: [birth year] (72 y.o. [name] Primary Care Kary Colaizzi: Jim Like Other Clinician: Referring Micca Matura: Treating Maydell Knoebel/Extender: Rory Percy in  Treatment: 39 Clinic Level of Care Assessment Items TOOL 4 Quantity Score X- 1 0 Use when only an EandM is performed on FOLLOW-UP visit ASSESSMENTS - Nursing Assessment / Reassessment X- 1 10 Reassessment of Co-morbidities (includes updates in patient status) X- 1 5 Reassessment of Adherence to Treatment Plan ASSESSMENTS - Wound and Skin A ssessment / Reassessment X - Simple Wound Assessment / Reassessment - one wound 1 5 '[]'$  - 0 Complex Wound Assessment / Reassessment - multiple wounds '[]'$  - 0 Dermatologic / Skin Assessment (not related to wound area) ASSESSMENTS - Focused Assessment '[]'$  - 0 Circumferential Edema Measurements - multi extremities '[]'$  - 0 Nutritional Assessment / Counseling / Intervention [name], [name] ([id number]) [id number] Page 2 of 6 '[]'$  - 0 Lower Extremity Assessment (monofilament, tuning fork, pulses) '[]'$  - 0 Peripheral Arterial Disease Assessment (using hand held doppler) ASSESSMENTS - Ostomy and/or Continence Assessment and Care '[]'$  - 0 Incontinence Assessment and Management '[]'$  - 0 Ostomy Care Assessment and Management (repouching, etc.) PROCESS - Coordination of Care X - Simple Patient / Family Education for ongoing care 1 15 '[]'$  - 0 Complex (extensive) Patient / Family Education for ongoing care X- 1 10 Staff obtains Programmer, systems, Records, T Results / Process Orders est '[]'$  - 0 Staff telephones HHA, Nursing Homes / Clarify orders / etc '[]'$  - 0 Routine Transfer to another Facility (non-emergent condition) '[]'$  - 0 Routine Hospital Admission (non-emergent condition) '[]'$  - 0 New Admissions / Biomedical engineer / Ordering NPWT Apligraf, etc. , '[]'$  - 0 Emergency Hospital Admission (emergent condition) X- 1 10 Simple Discharge Coordination '[]'$  - 0 Complex (extensive) Discharge Coordination PROCESS - Special Needs '[]'$  - 0 Pediatric / Minor Patient Management '[]'$  - 0 Isolation Patient Management '[]'$  - 0 Hearing / Language /  Visual special needs '[]'$  - 0 Assessment of Community assistance (transportation, D/C planning, etc.) '[]'$  - 0 Additional assistance / Altered mentation '[]'$  - 0 Support Surface(s) Assessment (bed, cushion, seat, etc.) INTERVENTIONS -  Wound Cleansing / Measurement '[]'$  - 0 Simple Wound Cleansing - one wound '[]'$  - 0 Complex Wound Cleansing - multiple wounds '[]'$  - 0 Wound Imaging (photographs - any number of wounds) '[]'$  - 0 Wound Tracing (instead of photographs) '[]'$  - 0 Simple Wound Measurement - one wound '[]'$  - 0 Complex Wound Measurement - multiple wounds INTERVENTIONS - Wound Dressings '[]'$  - 0 Small Wound Dressing one or multiple wounds '[]'$  - 0 Medium Wound Dressing one or multiple wounds '[]'$  - 0 Large Wound Dressing one or multiple wounds '[]'$  - 0 Application of Medications - topical '[]'$  - 0 Application of Medications - injection INTERVENTIONS - Miscellaneous '[]'$  - 0 External ear exam '[]'$  - 0 Specimen Collection (cultures, biopsies, blood, body fluids, etc.) '[]'$  - 0 Specimen(s) / Culture(s) sent or taken to Lab for analysis '[]'$  - 0 Patient Transfer (multiple staff / Civil Service fast streamer / Similar devices) '[]'$  - 0 Simple Staple / Suture removal (25 or less) '[]'$  - 0 Complex Staple / Suture removal (26 or more) '[]'$  - 0 Hypo / Hyperglycemic Management (close monitor of Blood Glucose) [place], [name] ([id number]) [id number] Page 3 of 6 '[]'$  - 0 Ankle / Brachial Index (ABI) - do not check if billed separately X- 1 5 Vital Signs Has the patient been seen at the hospital within the last three years: Yes Total Score: 60 Level Of Care: New/Established - Level 2 Electronic Signature(s) Signed: 01/01/2023 4:13:50 PM By: Adline Peals Entered By: Adline Peals on 01/01/2023 15:58:22 -------------------------------------------------------------------------------- Encounter Discharge Information Details Patient Name: Date of Service: [name], [name] 01/01/2023  2:45 PM Medical Record Number: [id number] Patient Account Number: [id number] Date of Birth/Sex: Treating RN: 1951-09-29 (72 y.o. [name] Primary Care Chany Woolworth: Jim Like Other Clinician: Referring Devone Tousley: Treating Neysha Criado/Extender: Rory Percy in Treatment: 39 Encounter Discharge Information Items Discharge Condition: Stable Ambulatory Status: Wheelchair Discharge Destination: Home Transportation: Private Auto Accompanied By: self Schedule Follow-up Appointment: Yes Clinical Summary of Care: Patient Declined Electronic Signature(s) Signed: 01/01/2023 4:13:50 PM By: Sabas Sous By: Adline Peals on 01/01/2023 15:58:48 -------------------------------------------------------------------------------- Lower Extremity Assessment Details Patient Name: Date of Service: [name], [name] 01/01/2023 2:45 PM Medical Record Number: [id number] Patient Account Number: [id number] Date of Birth/Sex: Treating RN: [birth year] (72 y.o. [name] Primary Care Lonna Rabold: Jim Like Other Clinician: Referring Denean Pavon: Treating Eviana Sibilia/Extender: Rory Percy in Treatment: 39 Edema Assessment Assessed: Shirlyn Goltz: No] Patrice Paradise: No] [Left: Edema] [Right: :] Calf Left: Right: Point of Measurement: From Medial Instep 40.5 cm Ankle Left: Right: Point of Measurement: From Medial Instep 26 cm Electronic Signature(s) Signed: 01/01/2023 4:13:50 PM By: Adline Peals Knueppel,Signed: 01/01/2023 4:13:50 PM By: Nydia Bouton ([id number]) [id number] Page 4 of 6 aylor Entered By: Adline Peals on 01/01/2023 14:51:08 -------------------------------------------------------------------------------- Multi Wound Chart Details Patient Name: Date of Service: [name], [place]. 01/01/2023 2:45 PM Medical Record Number: [id number] Patient Account Number: [id number] Date of  Birth/Sex: Treating RN: Feb 14, 1951 (72 y.o. F) Primary Care Drewey Begue: Jim Like Other Clinician: Referring Jeslyn Amsler: Treating Niani Mourer/Extender: Rory Percy in Treatment: 39 Vital Signs Height(in): 67 Pulse(bpm): 44 Weight(lbs): 243 Blood Pressure(mmHg): 142/67 Body Mass Index(BMI): 38.1 Temperature(F): 98.0 Respiratory Rate(breaths/min): 18 [Treatment Notes:Wound Assessments Treatment Notes] Electronic Signature(s) Signed: 01/01/2023 3:41:43 PM By: Fredirick Maudlin MD FACS Entered By: Fredirick Maudlin on 01/01/2023 15:41:42 -------------------------------------------------------------------------------- Multi-Disciplinary Care Plan Details Patient Name: Date of Service: [name], [name] 01/01/2023 2:45 PM Medical  Record Number: [id number] Patient Account Number: [id number] Date of Birth/Sex: Treating RN: November 20, 1951 (72 y.o. [name] Primary Care Denard Tuminello: Jim Like Other Clinician: Referring Marvelyn Bouchillon: Treating Humzah Harty/Extender: Rory Percy in Treatment: [place] reviewed with physician Active Inactive Electronic Signature(s) Signed: 01/01/2023 4:13:50 PM By: Sabas Sous By: Adline Peals on 01/01/2023 14:51:19 -------------------------------------------------------------------------------- Pain Assessment Details Patient Name: Date of Service: [name], [place] [name] 01/01/2023 2:45 PM Medical Record Number: [id number] Patient Account Number: [id number] Date of Birth/Sex: Treating RN: 14-May-1951 (72 y.o. F) [name], [name] ([id number]) [id number] Page 5 of 6 Primary Care Jeliyah Middlebrooks: Jim Like Other Clinician: Referring Hilmer Aliberti: Treating Quadre Bristol/Extender: Rory Percy in Treatment: 39 Active Problems Location of Pain Severity and Description of Pain Patient Has Paino No Site Locations Pain  Management and Medication Current Pain Management: Electronic Signature(s) Signed: 01/01/2023 3:46:17 PM By: Worthy Rancher Entered By: Worthy Rancher on 01/01/2023 14:46:32 -------------------------------------------------------------------------------- Patient/Caregiver Education Details Patient Name: Date of Service: [name] 1/8/2024andnbsp2:45 PM Medical Record Number: [id number] Patient Account Number: [id number] Date of Birth/Gender: Treating RN: Oct 29, 1951 (72 y.o. [name] Primary Care Physician: Jim Like Other Clinician: Referring Physician: Treating Physician/Extender: Rory Percy in Treatment: 39 Education Assessment Education Provided To: Patient Education Topics Provided Safety: Methods: Explain/Verbal Responses: Reinforcements needed, State content correctly Electronic Signature(s) Signed: 01/01/2023 4:13:50 PM By: Adline Peals Entered By: Adline Peals on 01/01/2023 14:51:36 [name], [name] ([id number]) [id number] Page 6 of 6 -------------------------------------------------------------------------------- Vitals Details Patient Name: Date of Service: [place], [name] 01/01/2023 2:45 PM Medical Record Number: [id number] Patient Account Number: [id number] Date of Birth/Sex: Treating RN: [birth year] (72 y.o. F) Primary Care Julienne Vogler: Jim Like Other Clinician: Referring Margrit Minner: Treating Metta Koranda/Extender: Rory Percy in Treatment: 39 Vital Signs Time Taken: 02:45 Temperature (F): 98.0 Height (in): 67 Pulse (bpm): 44 Weight (lbs): 243 Respiratory Rate (breaths/min): 18 Body Mass Index (BMI): 38.1 Blood Pressure (mmHg): 142/67 Reference Range: 80 - 120 mg / dl Electronic Signature(s) Signed: 01/01/2023 3:46:17 PM By: Worthy Rancher Entered By: Worthy Rancher on 01/01/2023 14:46:25

## 2023-01-02 DIAGNOSIS — Z794 Long term (current) use of insulin: Secondary | ICD-10-CM | POA: Diagnosis not present

## 2023-01-02 DIAGNOSIS — E109 Type 1 diabetes mellitus without complications: Secondary | ICD-10-CM | POA: Diagnosis not present

## 2023-01-02 NOTE — Progress Notes (Signed)
[name], [name] ([id number]) [id number] Page 1 of 9 Visit Report for 01/01/2023 Chief Complaint Document Details Patient Name: Date of Service: [place], [name] 01/01/2023 2:45 PM Medical Record Number: [id number] Patient Account Number: [id number] Date of Birth/Sex: Treating RN: August 04, 1951 (72 y.o. F) Primary Care Provider: Jim Like Other Clinician: Referring Provider: Treating Provider/Extender: Rory Percy in Treatment: 39 Information Obtained from: Patient Chief Complaint Patient presents for treatment of open ulcers due to venous insufficiency in the setting of poorly controlled DM1 and CHF Electronic Signature(s) Signed: 01/01/2023 3:41:49 PM By: Fredirick Maudlin MD FACS Entered By: Fredirick Maudlin on 01/01/2023 15:41:49 -------------------------------------------------------------------------------- HPI Details Patient Name: Date of Service: [name], [name] 01/01/2023 2:45 PM Medical Record Number: [id number] Patient Account Number: [id number] Date of Birth/Sex: Treating RN: 1951-03-27 (72 y.o. F) Primary Care Provider: Jim Like Other Clinician: Referring Provider: Treating Provider/Extender: Rory Percy in Treatment: 39 History of Present Illness HPI Description: ADMISSION 04/03/2022 This is a 72 year old woman with a past medical history notable for type 1 diabetes mellitus, poorly controlled with her most recent A1c being 8.3, congestive heart failure with recent admission for exacerbation secondary to noncompliance with diuretics, as well as history of pontine stroke and oxygen dependence. She has wounds on her bilateral lower extremities. She says they have been present for about 2 months and initially started as blisters. She says that she has compression stockings, but does not wear them because they are too difficult to get on. ABIs done in clinic were 1.4 and 1.5,  suggesting some degree of noncompressibility. She also has a wound on her right heel that she says is secondary to it getting bumped frequently as she transfers. She came to clinic with just dry dressings on her legs. She does have home health care, as well as her sister that have been assisting her with her dressings. She has multiple wounds. On the left medial calf, just above her ankle, there is a somewhat geographic wound with slough and some epithelialization. On the anterior tibial surface and lateral calf, the wounds are quite desiccated with yellow eschar present. On the right anterior tibial surface, the wounds are gritty with thin slough and eschar. The posterior right ankle wound is just above the calcaneus overlying the Achilles and has thick yellow-gray slough and a bit of odor. No purulent drainage appreciated. 04/10/2022: The wounds on her left leg are closed. On the right, the anterior tibial surface wounds are epithelializing and much cleaner. There is just a layer of biofilm on the open areas. On the posterior right ankle wound, there is a decreased volume of slough and no odor. We have been using Hydrofera Blue to all sites under compression. 04/17/2022: The wounds on her left leg remained closed and she did bring her juxta lite stockings with her today. On the right, the more distal wound is closed and the proximal wound has epithelialized significantly since her last visit. At the posterior right ankle, she still has accumulated some slough and some periwound callus but there is no odor or significant drainage. 04/24/2022: The right anterior leg wounds are closed. She still has a wound on her posterior right ankle that looks about the same as last week. She did not bring her right sided juxta light with her today but is wearing the left sided stocking. 05/01/2022: She has a tiny abrasion on her left anterior tibia which we did not add to her wound count today.  The wound on her posterior  right ankle is a little bit smaller with a little accumulated slough. No drainage or concern for infection. 05/08/2022: She continues to forget to bring her juxta lite stocking with her so she remains in a compression wrap despite healing of the right anterior leg wounds. The wound on her posterior right ankle/calcaneus is a bit smaller today with just a little bit of accumulated slough. She is wearing a juxta lite stocking on the left leg. [name] ([id number]) [id number] Page 2 of 9 05/15/2022: She says that she has been wearing her juxta lite stockings at home, but today she is wearing regular compression stockings that she purchased from [employer]; the juxta lite stockings are in the laundry. The wound on her posterior right ankle/calcaneus continues to contract and has just a little bit of accumulated slough. Unfortunately, her edema control is suboptimal on the left and she has developed several small blisters with a larger 1 at the medial calf. 05/23/2022: The patient has been wearing her juxta lite stockings. Unfortunately, she has developed a large blister on her right lower leg just above the ankle. The skin overlying it is still intact. The blister that had opened on her left medial calf is smaller; and the other small blistered sites are closed. The posterior right ankle/calcaneus is about the same today. 06/07/2022: The only remaining open wound is on her posterior right calcaneus. The majority of this is covered with eschar. 06/30/2022: I am not sure why the patient did not return to clinic until today, but the ulcer on her right heel has healed. Unfortunately, she has developed a new ulcer on her left lateral foot. From the position of it, it looks as though the outward rotation of her lower leg at rest is probably causing the lateral midfoot to rub or press against the metal leg rests of her wheelchair. She is not padding the chair when she rests her leg  on the leg rests. The new ulcer is fairly superficial with just the fat layer exposed. It is quite tender. No erythema, induration, or purulent drainage identified. 07/12/2022: After our last visit, she was seen by podiatry. Nail care was performed. For some reason he had her apply silicone toe covers on her bilateral great toes. As result, the skin is now very macerated and she has a new ulcer on the knuckle of her right great toe. It is fairly small but does expose the fat layer. No erythema induration or purulent drainage although there was some odor on her feet when the caps were removed. The wound on her left lateral foot is smaller with just a small amount of slough buildup. Her Prevalon boots arrived and we will put her in those after her dressing is changed. 08/02/2022: She has been wearing her Prevalon boots to some extent, but says that she spends most of her day in her wheelchair and the boots slide off the foot rest. She apparently has not been utilizing the leg strap. The left lateral foot wound is tender and the tissue has some discoloration suggestive of ongoing pressure in this area. There is a little bit of callus accumulation circumferentially. The wound on her right great toe DIP joint is nearly closed and just has a light layer of eschar overlying the surface. No concern for infection. 08/16/2022: The wound on her right great toe has healed. The wound on her left lateral foot is quite a bit smaller with good perimeter epithelialization and  a light layer of slough on the surface. 08/30/2022: Today she is complaining of a lot more pain on her left lateral foot. I think we are struggling with adequate padding and prevention of pressure in the site. The wound opening itself is quite small, but there is circumferential undermining from old skin. There is no odor or purulent drainage. 09/13/2022: The lateral foot wound looks significantly improved today. It is smaller and I no longer detect the  undermining. 09/27/2022: The lateral foot wound has healed. Unfortunately, she has developed a new wound that appears to be secondary to friction on her left great toe dorsal knuckle. There is a little bit of slough present but no concern for infection. 10/11/2022: The wound on her left great toe is half the size as it was last week. There is light slough and some periwound eschar. No concern for infection. 10/25/2022: The wound on her left great toe is almost completely healed; there is just a tiny open area under some callus and eschar. 11/08/2022: Her wound is healed. 01/01/2023: The patient contacted the clinic wanting an appointment because she thought she had wounds open on her heels. She said that they had been hurting and she thought she felt some drainage. On inspection today, she has no open wounds. Electronic Signature(s) Signed: 01/01/2023 3:42:31 PM By: Fredirick Maudlin MD FACS Entered By: Fredirick Maudlin on 01/01/2023 15:42:31 -------------------------------------------------------------------------------- Physical Exam Details Patient Name: Date of Service: [name], [name] 01/01/2023 2:45 PM Medical Record Number: [id number] Patient Account Number: [id number] Date of Birth/Sex: Treating RN: 1951/03/16 (72 y.o. F) Primary Care Provider: Jim Like Other Clinician: Referring Provider: Treating Provider/Extender: Rory Percy in Treatment: 39 Constitutional Slightly hypertensive. Bradycardic, asymptomatic. . . no acute distress. Respiratory Normal work of breathing on supplemental oxygen. Notes 01/01/2023: No open wounds Electronic Signature(s) Signed: 01/01/2023 3:43:12 PM By: Fredirick Maudlin MD FACS Entered By: Fredirick Maudlin on 01/01/2023 15:43:12 Barnier, [name] ([id number]) [id number] Page 3 of 9 -------------------------------------------------------------------------------- Physician Orders Details Patient  Name: Date of Service: CHARLENA, HAUB. 01/01/2023 2:45 PM Medical Record Number: [id number] Patient Account Number: [id number] Date of Birth/Sex: Treating RN: 10/14/1951 (72 y.o. [name] Primary Care Provider: Jim Like Other Clinician: Referring Provider: Treating Provider/Extender: Rory Percy in Treatment: 38 Verbal / Phone Orders: No Diagnosis Coding ICD-10 Coding Code Description I89.0 Lymphedema, not elsewhere classified T73.22 Chronic diastolic (congestive) heart failure E10.59 Type 1 diabetes mellitus with other circulatory complications G25.42 Type 1 diabetes mellitus with diabetic polyneuropathy Z68.41 Body mass index [BMI]40.0-44.9, adult E10.622 Type 1 diabetes mellitus with other skin ulcer Discharge From [facility] Discharge from [place] Edema Control - Lymphedema / SCD / Other Bilateral Lower Extremities Patient to wear own compression stockings every day. - juxtalites to both legs Exercise regularly Off-Loading Other: - Prevalon boot to both feet. Please put pillow between foot and foot rest so the foot is not resting on the metal footrest. Electronic Signature(s) Signed: 01/01/2023 5:11:08 PM By: Fredirick Maudlin MD FACS Entered By: Fredirick Maudlin on 01/01/2023 15:43:26 -------------------------------------------------------------------------------- Problem List Details Patient Name: Date of Service: [name], [name] 01/01/2023 2:45 PM Medical Record Number: [id number] Patient Account Number: [id number] Date of Birth/Sex: Treating RN: 09-05-1951 (72 y.o. F) Primary Care Provider: Jim Like Other Clinician: Referring Provider: Treating Provider/Extender: Rory Percy in Treatment: 39 Active Problems ICD-10 Encounter Code Description Active Date MDM Diagnosis I89.0 Lymphedema, not elsewhere classified 04/03/2022  No Yes P71.06 Chronic diastolic (congestive) heart  failure 04/03/2022 No Yes E10.59 Type 1 diabetes mellitus with other circulatory complications 2/69/4854 No Yes Guillen, [name] ([id number]) [id number] Page 4 of 9 E10.42 Type 1 diabetes mellitus with diabetic polyneuropathy 04/03/2022 No Yes Z68.41 Body mass index [BMI]40.0-44.9, adult 04/03/2022 No Yes E10.622 Type 1 diabetes mellitus with other skin ulcer 04/03/2022 No Yes Inactive Problems ICD-10 Code Description Active Date Inactive Date L97.212 Non-pressure chronic ulcer of right calf with fat layer exposed 04/03/2022 04/03/2022 F81.829 Non-pressure chronic ulcer of left calf with fat layer exposed 04/03/2022 04/03/2022 L97.312 Non-pressure chronic ulcer of right ankle with fat layer exposed 04/03/2022 04/03/2022 L97.821 Non-pressure chronic ulcer of other part of left lower leg limited to breakdown of skin 05/15/2022 05/15/2022 L97.422 Non-pressure chronic ulcer of left heel and midfoot with fat layer exposed 06/30/2022 06/30/2022 L97.811 Non-pressure chronic ulcer of other part of right lower leg limited to breakdown of skin 05/23/2022 05/23/2022 L97.522 Non-pressure chronic ulcer of other part of left foot with fat layer exposed 07/12/2022 07/12/2022 Resolved Problems Electronic Signature(s) Signed: 01/01/2023 3:41:36 PM By: Fredirick Maudlin MD FACS Entered By: Fredirick Maudlin on 01/01/2023 15:41:36 -------------------------------------------------------------------------------- Progress Note Details Patient Name: Date of Service: [name], [name] 01/01/2023 2:45 PM Medical Record Number: [id number] Patient Account Number: [id number] Date of Birth/Sex: Treating RN: [birth year] (72 y.o. F) Primary Care Provider: Jim Like Other Clinician: Referring Provider: Treating Provider/Extender: Rory Percy in Treatment: 39 Subjective Chief Complaint Information obtained from Patient Patient presents for treatment of open ulcers due to  venous insufficiency in the setting of poorly controlled DM1 and CHF History of Present Illness (HPI) ADMISSION 04/03/2022 Pelzel, [name] ([id number]) [id number] Page 5 of 9 This is a 72 year old woman with a past medical history notable for type 1 diabetes mellitus, poorly controlled with her most recent A1c being 8.3, congestive heart failure with recent admission for exacerbation secondary to noncompliance with diuretics, as well as history of pontine stroke and oxygen dependence. She has wounds on her bilateral lower extremities. She says they have been present for about 2 months and initially started as blisters. She says that she has compression stockings, but does not wear them because they are too difficult to get on. ABIs done in clinic were 1.4 and 1.5, suggesting some degree of noncompressibility. She also has a wound on her right heel that she says is secondary to it getting bumped frequently as she transfers. She came to clinic with just dry dressings on her legs. She does have home health care, as well as her sister that have been assisting her with her dressings. She has multiple wounds. On the left medial calf, just above her ankle, there is a somewhat geographic wound with slough and some epithelialization. On the anterior tibial surface and lateral calf, the wounds are quite desiccated with yellow eschar present. On the right anterior tibial surface, the wounds are gritty with thin slough and eschar. The posterior right ankle wound is just above the calcaneus overlying the Achilles and has thick yellow-gray slough and a bit of odor. No purulent drainage appreciated. 04/10/2022: The wounds on her left leg are closed. On the right, the anterior tibial surface wounds are epithelializing and much cleaner. There is just a layer of biofilm on the open areas. On the posterior right ankle wound, there is a decreased volume of slough and no odor. We have been  using Hydrofera Blue to all sites under compression. 04/17/2022:  The wounds on her left leg remained closed and she did bring her juxta lite stockings with her today. On the right, the more distal wound is closed and the proximal wound has epithelialized significantly since her last visit. At the posterior right ankle, she still has accumulated some slough and some periwound callus but there is no odor or significant drainage. 04/24/2022: The right anterior leg wounds are closed. She still has a wound on her posterior right ankle that looks about the same as last week. She did not bring her right sided juxta light with her today but is wearing the left sided stocking. 05/01/2022: She has a tiny abrasion on her left anterior tibia which we did not add to her wound count today. The wound on her posterior right ankle is a little bit smaller with a little accumulated slough. No drainage or concern for infection. 05/08/2022: She continues to forget to bring her juxta lite stocking with her so she remains in a compression wrap despite healing of the right anterior leg wounds. The wound on her posterior right ankle/calcaneus is a bit smaller today with just a little bit of accumulated slough. She is wearing a juxta lite stocking on the left leg. 05/15/2022: She says that she has been wearing her juxta lite stockings at home, but today she is wearing regular compression stockings that she purchased from [employer]; the juxta lite stockings are in the laundry. The wound on her posterior right ankle/calcaneus continues to contract and has just a little bit of accumulated slough. Unfortunately, her edema control is suboptimal on the left and she has developed several small blisters with a larger 1 at the medial calf. 05/23/2022: The patient has been wearing her juxta lite stockings. Unfortunately, she has developed a large blister on her right lower leg just above the ankle. The skin overlying it is still intact. The  blister that had opened on her left medial calf is smaller; and the other small blistered sites are closed. The posterior right ankle/calcaneus is about the same today. 06/07/2022: The only remaining open wound is on her posterior right calcaneus. The majority of this is covered with eschar. 06/30/2022: I am not sure why the patient did not return to clinic until today, but the ulcer on her right heel has healed. Unfortunately, she has developed a new ulcer on her left lateral foot. From the position of it, it looks as though the outward rotation of her lower leg at rest is probably causing the lateral midfoot to rub or press against the metal leg rests of her wheelchair. She is not padding the chair when she rests her leg on the leg rests. The new ulcer is fairly superficial with just the fat layer exposed. It is quite tender. No erythema, induration, or purulent drainage identified. 07/12/2022: After our last visit, she was seen by podiatry. Nail care was performed. For some reason he had her apply silicone toe covers on her bilateral great toes. As result, the skin is now very macerated and she has a new ulcer on the knuckle of her right great toe. It is fairly small but does expose the fat layer. No erythema induration or purulent drainage although there was some odor on her feet when the caps were removed. The wound on her left lateral foot is smaller with just a small amount of slough buildup. Her Prevalon boots arrived and we will put her in those after her dressing is changed. 08/02/2022: She has been wearing her  Prevalon boots to some extent, but says that she spends most of her day in her wheelchair and the boots slide off the foot rest. She apparently has not been utilizing the leg strap. The left lateral foot wound is tender and the tissue has some discoloration suggestive of ongoing pressure in this area. There is a little bit of callus accumulation circumferentially. The wound on her right great  toe DIP joint is nearly closed and just has a light layer of eschar overlying the surface. No concern for infection. 08/16/2022: The wound on her right great toe has healed. The wound on her left lateral foot is quite a bit smaller with good perimeter epithelialization and a light layer of slough on the surface. 08/30/2022: Today she is complaining of a lot more pain on her left lateral foot. I think we are struggling with adequate padding and prevention of pressure in the site. The wound opening itself is quite small, but there is circumferential undermining from old skin. There is no odor or purulent drainage. 09/13/2022: The lateral foot wound looks significantly improved today. It is smaller and I no longer detect the undermining. 09/27/2022: The lateral foot wound has healed. Unfortunately, she has developed a new wound that appears to be secondary to friction on her left great toe dorsal knuckle. There is a little bit of slough present but no concern for infection. 10/11/2022: The wound on her left great toe is half the size as it was last week. There is light slough and some periwound eschar. No concern for infection. 10/25/2022: The wound on her left great toe is almost completely healed; there is just a tiny open area under some callus and eschar. 11/08/2022: Her wound is healed. 01/01/2023: The patient contacted the clinic wanting an appointment because she thought she had wounds open on her heels. She said that they had been hurting and she thought she felt some drainage. On inspection today, she has no open wounds. Patient History Information obtained from Patient. Family History Heart Disease - Mother, Hypertension - Mother,Father, Stroke - Child. Social History Never smoker, Marital Status - Divorced, Alcohol Use - Never, Drug Use - No History, Caffeine Use - Never. Medical History Eyes Patient has history of Cataracts Hematologic/Lymphatic Patient has history of  Anemia Respiratory Patient has history of Sleep Apnea Jakes, [name] ([id number]) [id number] Page 6 of 9 Cardiovascular Patient has history of Congestive Heart Failure, Hypertension Endocrine Patient has history of Type I Diabetes Oncologic Patient has history of Received Radiation Hospitalization/Surgery History - pacemaker implant. - loop recorder insertion and removal. - esophagogastroduodenoscopy. - TEE without cardioversion. - colonoscopy. - left and right heart catheterization. - abdominal angiogram. - thyroidectomy. - abdominal hysterectomy. - ankle fracture surgery. Medical A Surgical History Notes nd Ear/Nose/Mouth/Throat edentulous, GERD, seasonal allergies, trouble swallowing Hematologic/Lymphatic hyperlipidemia Cardiovascular complete heart block, bradycardia, paroxysmal atrial fibrillation, pacemaker Gastrointestinal hemorrhoids Endocrine hypothyroidism Genitourinary proteinuria, frequent UTIs Musculoskeletal decreased ROM L shoulder, hemiparesis affecting L side, arthritis Neurologic benign paroxysmal positional vertigo, intracranial vascular stenosis, lacunar infarction, stroke Oncologic thyroid cancer Psychiatric depression Objective Constitutional Slightly hypertensive. Bradycardic, asymptomatic. no acute distress. Vitals Time Taken: 2:45 AM, Height: 67 in, Weight: 243 lbs, BMI: 38.1, Temperature: 98.0 F, Pulse: 44 bpm, Respiratory Rate: 18 breaths/min, Blood Pressure: 142/67 mmHg. Respiratory Normal work of breathing on supplemental oxygen. General Notes: 01/01/2023: No open wounds Assessment Active Problems ICD-10 Lymphedema, not elsewhere classified Chronic diastolic (congestive) heart failure Type 1 diabetes mellitus with other circulatory complications Type 1  diabetes mellitus with diabetic polyneuropathy Body mass index [BMI]40.0-44.9, adult Type 1 diabetes mellitus with other skin ulcer Plan Discharge From  [facility] Services: Discharge from [place] Edema Control - Lymphedema / SCD / Other: Patient to wear own compression stockings every day. - juxtalites to both legs Exercise regularly Off-Loading: Other: - Prevalon boot to both feet. Please put pillow between foot and foot rest so the foot is not resting on the metal footrest. [name], [name] ([id number]) [id number] Page 7 of 9 01/01/2023: The patient contacted the clinic thinking she had new wounds on her heels. On evaluation today, no open wounds were identified. Her edema control is good and she has been wearing her juxta lite stockings on a regular basis. She needs to continue to wear the Prevalon boots to avoid pressure on her heels and feet as well as protect the knuckles of her first toes from rubbing against any footwear. She may follow-up in our clinic as needed. Electronic Signature(s) Signed: 01/01/2023 3:44:15 PM By: Fredirick Maudlin MD FACS Entered By: Fredirick Maudlin on 01/01/2023 15:44:15 -------------------------------------------------------------------------------- HxROS Details Patient Name: Date of Service: Siravo, [name] 01/01/2023 2:45 PM Medical Record Number: [id number] Patient Account Number: [id number] Date of Birth/Sex: Treating RN: 1951/08/17 (72 y.o. F) Primary Care Provider: Jim Like Other Clinician: Referring Provider: Treating Provider/Extender: Rory Percy in Treatment: 39 Information Obtained From Patient Eyes Medical History: Positive for: Cataracts Ear/Nose/Mouth/Throat Medical History: Past Medical History Notes: edentulous, GERD, seasonal allergies, trouble swallowing Hematologic/Lymphatic Medical History: Positive for: Anemia Past Medical History Notes: hyperlipidemia Respiratory Medical History: Positive for: Sleep Apnea Cardiovascular Medical History: Positive for: Congestive Heart Failure; Hypertension Past  Medical History Notes: complete heart block, bradycardia, paroxysmal atrial fibrillation, pacemaker Gastrointestinal Medical History: Past Medical History Notes: hemorrhoids Endocrine Medical History: Positive for: Type I Diabetes Past Medical History Notes: hypothyroidism Time with diabetes: 78 yrs Treated with: Insulin Blood sugar tested every day: Yes Tested : on pump Genitourinary [place], [name] ([id number]) [id number] Page 8 of 9 Medical History: Past Medical History Notes: proteinuria, frequent UTIs Musculoskeletal Medical History: Past Medical History Notes: decreased ROM L shoulder, hemiparesis affecting L side, arthritis Neurologic Medical History: Past Medical History Notes: benign paroxysmal positional vertigo, intracranial vascular stenosis, lacunar infarction, stroke Oncologic Medical History: Positive for: Received Radiation Past Medical History Notes: thyroid cancer Psychiatric Medical History: Past Medical History Notes: depression HBO Extended History Items Eyes: Cataracts Immunizations Pneumococcal Vaccine: Received Pneumococcal Vaccination: Yes Received Pneumococcal Vaccination On or After 60th Birthday: Yes Implantable Devices Yes Hospitalization / Surgery History Type of Hospitalization/Surgery pacemaker implant loop recorder insertion and removal esophagogastroduodenoscopy TEE without cardioversion colonoscopy left and right heart catheterization abdominal angiogram thyroidectomy abdominal hysterectomy ankle fracture surgery Family and Social History Heart Disease: Yes - Mother; Hypertension: Yes - Mother,Father; Stroke: Yes - Child; Never smoker; Marital Status - Divorced; Alcohol Use: Never; Drug Use: No History; Caffeine Use: Never; Financial Concerns: No; Food, Clothing or Shelter Needs: No; Support System Lacking: No; Transportation Concerns: No Electronic Signature(s) Signed: 01/01/2023 5:11:08  PM By: Fredirick Maudlin MD FACS Entered By: Fredirick Maudlin on 01/01/2023 15:42:37 -------------------------------------------------------------------------------- SuperBill Details Patient Name: Date of Service: Benito, [name] 01/01/2023 Medical Record Number: [id number] Patient Account Number: [id number] Luo, [name] ([id number]) [id number] Page 9 of 9 Date of Birth/Sex: Treating RN: 1951-07-25 (71 y.o. F) Primary Care Provider: Jim Like Other Clinician: Referring Provider: Treating Provider/Extender: Rory Percy in Treatment: 39 Diagnosis  Coding ICD-10 Codes Code Description I89.0 Lymphedema, not elsewhere classified G81.85 Chronic diastolic (congestive) heart failure E10.59 Type 1 diabetes mellitus with other circulatory complications U31.49 Type 1 diabetes mellitus with diabetic polyneuropathy Z68.41 Body mass index [BMI]40.0-44.9, adult E10.622 Type 1 diabetes mellitus with other skin ulcer Facility Procedures : CPT4 Code: 70263785 Description: 88502 - WOUND CARE VISIT-LEV 2 EST PT Modifier: Quantity: 1 Physician Procedures : CPT4 Code Description Modifier [id number] - WC PHYS LEVEL 3 - EST PT ICD-10 Diagnosis Description M09.47 Chronic diastolic (congestive) heart failure E10.59 Type 1 diabetes mellitus with other circulatory complications S96.28 Type 1 diabetes  mellitus with diabetic polyneuropathy I89.0 Lymphedema, not elsewhere classified Quantity: 1 Electronic Signature(s) Signed: 01/01/2023 4:13:50 PM By: Adline Peals Signed: 01/01/2023 5:11:08 PM By: Fredirick Maudlin MD FACS Previous Signature: 01/01/2023 3:44:35 PM Version By: Fredirick Maudlin MD FACS Entered By: Adline Peals on 01/01/2023 15:58:27

## 2023-01-04 ENCOUNTER — Ambulatory Visit (INDEPENDENT_AMBULATORY_CARE_PROVIDER_SITE_OTHER): Payer: Medicare PPO

## 2023-01-04 DIAGNOSIS — I442 Atrioventricular block, complete: Secondary | ICD-10-CM

## 2023-01-04 LAB — CUP PACEART REMOTE DEVICE CHECK
Battery Remaining Longevity: 111 mo
Battery Voltage: 3.01 V
Brady Statistic AP VP Percent: 57.99 %
Brady Statistic AP VS Percent: 0.09 %
Brady Statistic AS VP Percent: 41.23 %
Brady Statistic AS VS Percent: 0.62 %
Brady Statistic RA Percent Paced: 33.51 %
Brady Statistic RV Percent Paced: 98.77 %
Date Time Interrogation Session: 20240111051430
Implantable Lead Connection Status: 753985
Implantable Lead Connection Status: 753985
Implantable Lead Implant Date: 20210702
Implantable Lead Implant Date: 20210702
Implantable Lead Location: 753859
Implantable Lead Location: 753860
Implantable Lead Model: 5076
Implantable Lead Model: 5076
Implantable Pulse Generator Implant Date: 20210702
Lead Channel Impedance Value: 247 Ohm
Lead Channel Impedance Value: 342 Ohm
Lead Channel Impedance Value: 380 Ohm
Lead Channel Impedance Value: 418 Ohm
Lead Channel Pacing Threshold Amplitude: 0.625 V
Lead Channel Pacing Threshold Amplitude: 0.625 V
Lead Channel Pacing Threshold Pulse Width: 0.4 ms
Lead Channel Pacing Threshold Pulse Width: 0.4 ms
Lead Channel Sensing Intrinsic Amplitude: 10.875 mV
Lead Channel Sensing Intrinsic Amplitude: 10.875 mV
Lead Channel Sensing Intrinsic Amplitude: 3.375 mV
Lead Channel Sensing Intrinsic Amplitude: 3.375 mV
Lead Channel Setting Pacing Amplitude: 1.5 V
Lead Channel Setting Pacing Amplitude: 2.5 V
Lead Channel Setting Pacing Pulse Width: 0.4 ms
Lead Channel Setting Sensing Sensitivity: 1.2 mV
Zone Setting Status: 755011
Zone Setting Status: 755011

## 2023-01-08 ENCOUNTER — Other Ambulatory Visit: Payer: Self-pay | Admitting: Student

## 2023-01-09 ENCOUNTER — Ambulatory Visit: Payer: Medicare Other | Admitting: Podiatry

## 2023-01-09 ENCOUNTER — Encounter: Payer: Self-pay | Admitting: Podiatry

## 2023-01-09 VITALS — BP 120/66 | HR 80

## 2023-01-09 DIAGNOSIS — M2030 Hallux varus (acquired), unspecified foot: Secondary | ICD-10-CM

## 2023-01-09 DIAGNOSIS — M79675 Pain in left toe(s): Secondary | ICD-10-CM | POA: Diagnosis not present

## 2023-01-09 DIAGNOSIS — B351 Tinea unguium: Secondary | ICD-10-CM | POA: Diagnosis not present

## 2023-01-09 DIAGNOSIS — M79674 Pain in right toe(s): Secondary | ICD-10-CM | POA: Diagnosis not present

## 2023-01-09 DIAGNOSIS — E1065 Type 1 diabetes mellitus with hyperglycemia: Secondary | ICD-10-CM | POA: Diagnosis not present

## 2023-01-09 DIAGNOSIS — D689 Coagulation defect, unspecified: Secondary | ICD-10-CM

## 2023-01-09 DIAGNOSIS — E1159 Type 2 diabetes mellitus with other circulatory complications: Secondary | ICD-10-CM

## 2023-01-09 MED ORDER — GABAPENTIN 300 MG PO CAPS: 300.0000 mg | ORAL_CAPSULE | ORAL | 0 refills | Status: DC | PRN

## 2023-01-09 NOTE — Progress Notes (Signed)
This patient returns to my office for at risk foot care.  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  This patient is unable to cut nails herself  since the patient cannot reach her  nails.These nails are painful walking and wearing shoes.  Patient presents in a wheelchair  with her granddaughter..  This patient presents for at risk foot care today.  General Appearance  Alert, conversant and in no acute stress.  Vascular  Dorsalis pedis and posterior tibial  pulses are absent due to swelling. bilaterally.  Capillary return is within normal limits  bilaterally. Temperature is within normal limits  bilaterally.  Neurologic  Senn-Weinstein monofilament wire diminished  bilaterally. Muscle power within normal limits bilaterally.  Nails Thick disfigured discolored nails with subungual debris  from hallux to fifth toes bilaterally. No evidence of bacterial infection or drainage bilaterally.  Orthopedic  No limitations of motion  feet .  No crepitus or effusions noted.  No bony pathology or digital deformities noted.  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  Hallux  malleus  B/L  Skin  normotropic skin with no porokeratosis noted bilaterally.  Skin lesion on dorsum of hallux malleus  B/L.  Ulcer left foot treated by wound center.   Onychomycosis  Pain in right toes  Pain in left toes  Consent was obtained for treatment procedures.   Mechanical debridement of nails 1-5  bilaterally performed with a nail nipper.  Filed with dremel without incident. She is wearing band-aids over her hallux malleus  B/L.   Return office visit    3 months       Told patient to return for periodic foot care and evaluation due to potential at risk complications.   Gardiner Barefoot DPM

## 2023-01-15 DIAGNOSIS — R0602 Shortness of breath: Secondary | ICD-10-CM | POA: Diagnosis not present

## 2023-01-15 DIAGNOSIS — R296 Repeated falls: Secondary | ICD-10-CM | POA: Diagnosis not present

## 2023-01-16 ENCOUNTER — Other Ambulatory Visit: Payer: Self-pay

## 2023-01-16 ENCOUNTER — Other Ambulatory Visit: Payer: Self-pay | Admitting: Physician Assistant

## 2023-01-16 DIAGNOSIS — I480 Paroxysmal atrial fibrillation: Secondary | ICD-10-CM

## 2023-01-16 MED ORDER — APIXABAN 5 MG PO TABS: 5.0000 mg | ORAL_TABLET | Freq: Two times a day (BID) | ORAL | 1 refills | Status: DC

## 2023-01-19 ENCOUNTER — Other Ambulatory Visit: Payer: Self-pay | Admitting: Physician Assistant

## 2023-01-19 DIAGNOSIS — I10 Essential (primary) hypertension: Secondary | ICD-10-CM

## 2023-01-21 DIAGNOSIS — R296 Repeated falls: Secondary | ICD-10-CM | POA: Diagnosis not present

## 2023-01-21 DIAGNOSIS — R0602 Shortness of breath: Secondary | ICD-10-CM | POA: Diagnosis not present

## 2023-01-22 DIAGNOSIS — I5032 Chronic diastolic (congestive) heart failure: Secondary | ICD-10-CM | POA: Diagnosis not present

## 2023-01-22 DIAGNOSIS — J9611 Chronic respiratory failure with hypoxia: Secondary | ICD-10-CM | POA: Diagnosis not present

## 2023-01-22 DIAGNOSIS — I10 Essential (primary) hypertension: Secondary | ICD-10-CM | POA: Diagnosis not present

## 2023-01-22 DIAGNOSIS — Z950 Presence of cardiac pacemaker: Secondary | ICD-10-CM | POA: Diagnosis not present

## 2023-01-22 DIAGNOSIS — E1165 Type 2 diabetes mellitus with hyperglycemia: Secondary | ICD-10-CM | POA: Diagnosis not present

## 2023-01-22 DIAGNOSIS — E039 Hypothyroidism, unspecified: Secondary | ICD-10-CM | POA: Diagnosis not present

## 2023-01-22 DIAGNOSIS — I480 Paroxysmal atrial fibrillation: Secondary | ICD-10-CM | POA: Diagnosis not present

## 2023-01-22 DIAGNOSIS — E785 Hyperlipidemia, unspecified: Secondary | ICD-10-CM | POA: Diagnosis not present

## 2023-01-22 DIAGNOSIS — I69354 Hemiplegia and hemiparesis following cerebral infarction affecting left non-dominant side: Secondary | ICD-10-CM | POA: Diagnosis not present

## 2023-01-22 DIAGNOSIS — D6869 Other thrombophilia: Secondary | ICD-10-CM | POA: Diagnosis not present

## 2023-01-23 ENCOUNTER — Telehealth: Payer: Self-pay | Admitting: Pulmonary Disease

## 2023-01-23 NOTE — Telephone Encounter (Signed)
PT calling. Was with [employer] now with [facility] states she needs assistance changing O2 supplier. Please call to advise. [phone]

## 2023-01-24 NOTE — Telephone Encounter (Signed)
Hey ladies,   Do we know who [employer] [facility] covers DME and Oxygen wise. I called patient but she states she is unsure. She does not want to call insurance  Do we know> So I can change the oxygen to a approved DME company.  Thank you

## 2023-01-24 NOTE — Progress Notes (Signed)
Remote pacemaker transmission.   

## 2023-01-25 NOTE — Telephone Encounter (Signed)
All of the dme's that I am aware of take [facility] insurance.

## 2023-01-25 NOTE — Telephone Encounter (Signed)
Called Brad from [employer] and he did clarify that [employer] does still take this patients insurance. So she does not need to change DME companies. Looks like most recent order was placed in November.   Attempted to call patient to give her an update but line rang 4 times then went dead. Will try to call and update patient another time to let her know that we do not have to change DME companies for her oxygen.

## 2023-02-01 DIAGNOSIS — E109 Type 1 diabetes mellitus without complications: Secondary | ICD-10-CM | POA: Diagnosis not present

## 2023-02-01 DIAGNOSIS — Z794 Long term (current) use of insulin: Secondary | ICD-10-CM | POA: Diagnosis not present

## 2023-02-02 ENCOUNTER — Other Ambulatory Visit: Payer: Self-pay | Admitting: Student

## 2023-02-06 ENCOUNTER — Other Ambulatory Visit: Payer: Self-pay

## 2023-02-06 ENCOUNTER — Encounter: Payer: Self-pay | Admitting: Internal Medicine

## 2023-02-06 ENCOUNTER — Ambulatory Visit: Payer: Medicare Other | Admitting: Internal Medicine

## 2023-02-06 VITALS — BP 133/46 | HR 51 | Resp 16 | Ht 67.0 in | Wt 240.0 lb

## 2023-02-06 DIAGNOSIS — I480 Paroxysmal atrial fibrillation: Secondary | ICD-10-CM

## 2023-02-06 DIAGNOSIS — I5032 Chronic diastolic (congestive) heart failure: Secondary | ICD-10-CM

## 2023-02-06 DIAGNOSIS — R0602 Shortness of breath: Secondary | ICD-10-CM | POA: Diagnosis not present

## 2023-02-06 DIAGNOSIS — E782 Mixed hyperlipidemia: Secondary | ICD-10-CM

## 2023-02-06 MED ORDER — FUROSEMIDE 40 MG PO TABS: 40.0000 mg | ORAL_TABLET | Freq: Two times a day (BID) | ORAL | 3 refills | Status: AC

## 2023-02-06 NOTE — Progress Notes (Signed)
Primary Physician/Referring:  Eppie Gibson, MD  Patient ID: [name], female    DOB: 12/05/1951, 72 y.o.   MRN: [id number]  Chief Complaint  Patient presents with   Paroxysmal atrial fibrillation   Follow-up    3 months   HPI:    [name]  is a 72 y.o. female with past medical history significant for PAF, CHB s/p PPM, and strokes in 2016 and 2019 who is here for a follow-up visit.. Patient is feeling well on her current medication regiment. She denies chest pain, shortness of breath, palpitations, diaphoresis, syncope, orthopnea, PND, claudication, headaches. She has not had labs checked in quite some time and is agreeable to getting blood work.    Past Medical History:  Diagnosis Date   Abnormal echocardiogram    Anemia    Arthritis    BPPV (benign paroxysmal positional vertigo), right 06/13/2017   Bradycardia 03/07/2020   Cerebrovascular accident (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 ([place]) 10/29/2015   CHB (complete heart block) ([place]) 03/08/2020   CHF (congestive heart failure) ([place])    Closed fracture of left proximal humerus 07/08/2019   Decreased range of motion of left shoulder 09/03/2020   Depression    Dizzy 03/07/2020   bradycardia   Edentulous 09/03/2020   GERD (gastroesophageal reflux disease)    Hemiparesis affecting left side as late effect of cerebrovascular accident (CVA) ([place]) 03/08/2020   Hemorrhoids    History of Falls with injury 03/08/2020   Fall resulting in left humeral fracture   Hyperlipidemia    Hypertension    Hypothyroidism    Intracranial vascular stenosis    Lacunar infarction ([place])    Brain MRI 07/2021 multiple small remote pontine lacunar infarcts   Near syncope 03/08/2020   Paroxysmal atrial fibrillation (HCC)    Presence of permanent cardiac pacemaker    Proteinuria 03/08/2020   Seasonal allergies    Stroke ([place])    Thyroid cancer ([place])    per pt S/p Total Thyroidectomy with Radioactive  Iodine Therapy   Type 1 diabetes mellitus ([place])    Past Surgical History:  Procedure Laterality Date   ABDOMINAL ANGIOGRAM N/A 05/14/2012   Procedure: ABDOMINAL ANGIOGRAM;  Surgeon: Laverda Page, MD;  Location: [facility] CATH LAB;  Service: Cardiovascular;  Laterality: N/A;   ABDOMINAL HYSTERECTOMY  1995   partial   ANKLE FRACTURE SURGERY Right    CARDIAC CATHETERIZATION     with coronary angiogram   cataract  Bilateral 2018   COLONOSCOPY N/A 09/09/2014   Procedure: COLONOSCOPY;  Surgeon: Gatha Mayer, MD;  Location: [place];  Service: Endoscopy;  Laterality: N/A;   ESOPHAGOGASTRODUODENOSCOPY (EGD) WITH PROPOFOL N/A 12/03/2018   Procedure: ESOPHAGOGASTRODUODENOSCOPY (EGD) WITH PROPOFOL;  Surgeon: Doran Stabler, MD;  Location: [place];  Service: Gastroenterology;  Laterality: N/A;   LEFT AND RIGHT HEART CATHETERIZATION WITH CORONARY ANGIOGRAM N/A 05/14/2012   Procedure: LEFT AND RIGHT HEART CATHETERIZATION WITH CORONARY ANGIOGRAM;  Surgeon: Laverda Page, MD;  Location: [facility] CATH LAB;  Service: Cardiovascular;  Laterality: N/A;   LOOP RECORDER INSERTION N/A 12/23/2018   Procedure: LOOP RECORDER INSERTION;  Surgeon: Constance Haw, MD;  Location: [place] CV LAB;  Service: Cardiovascular;  Laterality: N/A;   LOOP RECORDER REMOVAL N/A 06/25/2020   Procedure: LOOP RECORDER REMOVAL;  Surgeon: Constance Haw, MD;  Location: [place] CV LAB;  Service: Cardiovascular;  Laterality: N/A;   MINI METER   09/08/2014   ELECTRONIC  INSULIN PUMP   PACEMAKER IMPLANT N/A 06/25/2020   Procedure: PACEMAKER IMPLANT;  Surgeon: Constance Haw, MD;  Location: [place] CV LAB;  Service: Cardiovascular;  Laterality: N/A;   TEE WITHOUT CARDIOVERSION N/A 10/16/2018   Procedure: TRANSESOPHAGEAL ECHOCARDIOGRAM (TEE);  Surgeon: Buford Dresser, MD;  Location: [place];  Service: Cardiovascular;  Laterality: N/A;   THYROIDECTOMY  2006   for thyroid cancer    Family History  Problem Relation Age of Onset   Heart disease Mother    Hyperlipidemia Mother    Hypertension Mother    Renal Disease Mother        insufficiency   Heart attack Mother        in his 36's   Early death Father        Head Injury at Work   Hyperlipidemia Father    Hypertension Father    Stroke Son    Diabetes Maternal Aunt    Prostate cancer Paternal Uncle    Heart disease Maternal Aunt    Heart failure Maternal Grandfather    Heart disease Maternal Grandfather    Heart attack Maternal Grandfather        Died in his 52's   Breast cancer Neg Hx     Social History   Tobacco Use   Smoking status: Never   Smokeless tobacco: Never  Substance Use Topics   Alcohol use: No   Marital Status: Divorced  ROS  Review of Systems  Cardiovascular:  Negative for chest pain, claudication, dyspnea on exertion, irregular heartbeat, leg swelling, palpitations and syncope.   Objective  Blood pressure (!) 133/46, pulse (!) 51, resp. rate 16, height 5' 7"$  (1.702 m), weight 240 lb (108.9 kg), SpO2 98 %. Body mass index is 37.59 kg/m.     02/06/2023    1:02 PM 01/09/2023    1:55 PM 11/07/2022    1:21 PM  Vitals with BMI  Height 5' 7"$   5' 7"$   Weight 240 lbs    BMI [id number]    Systolic Q000111Q 123456 Q000111Q  Diastolic 46 66 53  Pulse 51 80 51     Physical Exam Vitals reviewed.  HENT:     Head: Normocephalic and atraumatic.  Cardiovascular:     Rate and Rhythm: Normal rate and regular rhythm.     Pulses: Normal pulses.     Heart sounds: Normal heart sounds. No murmur heard. Pulmonary:     Effort: Pulmonary effort is normal.     Breath sounds: Normal breath sounds.  Abdominal:     General: Bowel sounds are normal.  Musculoskeletal:     Comments: +chronic pedal edema  Skin:    General: Skin is warm and dry.  Neurological:     Mental Status: She is alert.     Medications and allergies   Allergies  Allergen Reactions   Latex Rash     Medication list after today's  encounter   Current Outpatient Medications:    acetaminophen (TYLENOL) 325 MG tablet, Take 2 tablets (650 mg total) by mouth every 6 (six) hours as needed for moderate pain., Disp: , Rfl:    amLODipine (NORVASC) 10 MG tablet, TAKE 1 TABLET(10 MG) BY MOUTH DAILY, Disp: 90 tablet, Rfl: 2   apixaban (ELIQUIS) 5 MG TABS tablet, Take 1 tablet (5 mg total) by mouth 2 (two) times daily., Disp: 180 tablet, Rfl: 1   atorvastatin (LIPITOR) 80 MG tablet, TAKE 1 TABLET(80 MG) BY MOUTH EVERY EVENING, Disp: 90 tablet, Rfl: 1  busPIRone (BUSPAR) 5 MG tablet, TAKE 1 TABLET(5 MG) BY MOUTH THREE TIMES DAILY, Disp: 90 tablet, Rfl: 0   cholecalciferol (VITAMIN D3) 25 MCG (1000 UNIT) tablet, Take 1,000 Units by mouth daily., Disp: , Rfl:    Cranberry 50 MG CHEW, Chew by mouth., Disp: , Rfl:    Fexofenadine HCl (ALLEGRA PO), Take 10 mg by mouth 2 (two) times daily., Disp: , Rfl:    fluticasone (FLONASE) 50 MCG/ACT nasal spray, Place 1 spray into both nostrils in the morning and at bedtime., Disp: 16 g, Rfl: 12   fluticasone-salmeterol (ADVAIR) 500-50 MCG/ACT AEPB, Inhale 1 puff into the lungs in the morning and at bedtime., Disp: 60 each, Rfl: 3   gabapentin (NEURONTIN) 300 MG capsule, Take 1 capsule (300 mg total) by mouth as needed., Disp: 30 capsule, Rfl: 0   insulin aspart (NOVOLOG) 100 UNIT/ML injection, Inject 5 Units into the skin 3 (three) times daily before meals. For blood sugar greater than or equal to 200, Disp: 10 mL, Rfl: 11   Insulin Human (INSULIN PUMP) SOLN, Inject 1 each into the skin 3 times daily with meals, bedtime and 2 AM., Disp: , Rfl:    levothyroxine (SYNTHROID) 175 MCG tablet, TAKE 1 TABLET(175 MCG) BY MOUTH DAILY BEFORE BREAKFAST, Disp: 30 tablet, Rfl: 2   loratadine (CLARITIN) 10 MG tablet, Take 10 mg by mouth every evening., Disp: , Rfl:    metoprolol succinate (TOPROL-XL) 50 MG 24 hr tablet, Take 1 tablet (50 mg total) by mouth daily., Disp: 90 tablet, Rfl: 3   multivitamin-lutein  (OCUVITE-LUTEIN) CAPS capsule, Take 1 capsule by mouth daily., Disp: , Rfl:    Omega-3 Fatty Acids (FISH OIL) 300 MG CAPS, Take by mouth., Disp: , Rfl:    pantoprazole (PROTONIX) 40 MG tablet, Take 1 tablet (40 mg total) by mouth 2 (two) times daily., Disp: 60 tablet, Rfl: 0   polyethylene glycol powder (GLYCOLAX/MIRALAX) 17 GM/SCOOP powder, Take 17 g by mouth 2 (two) times daily as needed for moderate constipation., Disp: 3350 g, Rfl: 3   potassium chloride (KLOR-CON) 10 MEQ tablet, Take 1 tablet (10 mEq total) by mouth daily., Disp: 30 tablet, Rfl: 0   traMADol (ULTRAM) 50 MG tablet, Take 1 tablet (50 mg total) by mouth every 6 (six) hours as needed., Disp: 10 tablet, Rfl: 0   furosemide (LASIX) 40 MG tablet, Take 1 tablet (40 mg total) by mouth 2 (two) times daily., Disp: 180 tablet, Rfl: 3   losartan (COZAAR) 25 MG tablet, Take 1 tablet (25 mg total) by mouth daily., Disp: 90 tablet, Rfl: 3  Laboratory examination:   Lab Results  Component Value Date   NA 142 05/29/2022   K 3.6 05/29/2022   CO2 29 05/29/2022   GLUCOSE 228 (H) 05/29/2022   BUN 10 05/29/2022   CREATININE 1.07 (H) 05/29/2022   CALCIUM 9.1 05/29/2022   EGFR 57 (L) 03/02/2022   GFRNONAA 56 (L) 05/29/2022       Latest Ref Rng & Units 05/29/2022    1:26 PM 03/02/2022    3:48 PM 02/15/2022    3:48 AM  CMP  Glucose 70 - 99 mg/dL 228  182  320   BUN 8 - 23 mg/dL 10  13  13   $ Creatinine 0.44 - 1.00 mg/dL 1.07  1.05  1.00   Sodium 135 - 145 mmol/L 142  145  138   Potassium 3.5 - 5.1 mmol/L 3.6  3.7  4.0   Chloride 98 - 111  mmol/L 105  102  100   CO2 22 - 32 mmol/L 29  25  27   $ Calcium 8.9 - 10.3 mg/dL 9.1  9.0  8.5       Latest Ref Rng & Units 05/29/2022    1:26 PM 03/02/2022    3:48 PM 02/15/2022    3:48 AM  CBC  WBC 4.0 - 10.5 K/uL 4.7  4.2  4.8   Hemoglobin 12.0 - 15.0 g/dL 10.9  11.0  9.6   Hematocrit 36.0 - 46.0 % 35.5  33.8  30.3   Platelets 150 - 400 K/uL 203  185  202     Lipid Panel No results for input(s):  "CHOL", "TRIG", "Kendrick", "VLDL", "HDL", "CHOLHDL", "LDLDIRECT" in the last 8760 hours.  HEMOGLOBIN A1C Lab Results  Component Value Date   HGBA1C 8.3 (H) 02/11/2022   MPG 191.51 02/11/2022   TSH Recent Labs    05/29/22 1932  TSH 2.658    External labs:   Recent Labs: 02/11/2022: ALT 10; Magnesium 1.7 05/29/2022: B Natriuretic Peptide 190.5; BUN 10; Creatinine, Ser 1.07; Hemoglobin 10.9; Platelets 203; Potassium 3.6; Sodium 142; TSH 2.658   Radiology:    Cardiac Studies:    TTE 10/14/18  Review of the above records today demonstrates:  - Left ventricle: The cavity size was normal. There was moderate   concentric hypertrophy. Systolic function was normal. The   estimated ejection fraction was in the range of 60% to 65%. Wall   motion was normal; there were no regional wall motion   abnormalities. The study is not technically sufficient to allow   evaluation of LV diastolic function. - Left atrium: The atrium was normal in size. - Right atrium: The atrium was normal in size.       EKG:   11/07/2022: normal sinus rhythm, rate 51, V-paced properly functioning  Assessment     ICD-10-CM   1. Chronic diastolic (congestive) heart failure (HCC)  I50.32 CMP14+EGFR    Magnesium    Pro b natriuretic peptide (BNP)    TSH    Lipid Panel With LDL/HDL Ratio    Hgb A1c w/o eAG    CBC    ECHOCARDIOGRAM COMPLETE    CANCELED: PCV ECHOCARDIOGRAM COMPLETE    2. SOB (shortness of breath)  R06.02 CMP14+EGFR    Magnesium    Pro b natriuretic peptide (BNP)    TSH    Lipid Panel With LDL/HDL Ratio    Hgb A1c w/o eAG    CBC    ECHOCARDIOGRAM COMPLETE    CANCELED: PCV ECHOCARDIOGRAM COMPLETE    3. Paroxysmal atrial fibrillation (HCC)  I48.0     4. Mixed hyperlipidemia  E78.2        Orders Placed This Encounter  Procedures   CMP14+EGFR   Magnesium   Pro b natriuretic peptide (BNP)   TSH   Lipid Panel With LDL/HDL Ratio   Hgb A1c w/o eAG   CBC   ECHOCARDIOGRAM  COMPLETE    Standing Status:   Future    Standing Expiration Date:   02/07/2024    Order Specific Question:   Where should this test be performed    Answer[place]     Order Specific Question:   Perflutren DEFINITY (image enhancing agent) should be administered unless hypersensitivity or allergy exist    Answer:   Administer Perflutren    Order Specific Question:   Is a special reader required? (athlete or structural heart)  Answer:   No    Order Specific Question:   Does this study need to be read by the Structural team/Level 3 readers?    Answer:   No    Order Specific Question:   Reason for exam-Echo    Answer:   CHF-Acute Diastolic  [id number]    No orders of the defined types were placed in this encounter.   There are no discontinued medications.    Recommendations:   [name] is a 72 y.o.  F with multiple medical co morbidities   PAF -On Eliquis and Toprol 42m     Pacemaker for CHB - Follows with Dr. CCurt Bears RTommye Standard- Functioning well.     Hyperlipidemia - High intensity statin therapy, goal LDL less than 70 Labs ordered    Diabetes with essential hypertension -Continue with antihypertensives.   --Tolerating Losartan 237mqHS Encourage low-sodium diet, less than 2000 mg daily.   Morbid obesity -Continue to encourage weight loss.    Secondary pulmonary hypertension - Continue to monitor, will repeat echo Follow-up in 3 months or sooner if needed    SaFloydene FlockDO, FASanford Medical Center Fargo2/19/2024, 6:00 PM Office: [phone]: [phone]

## 2023-02-09 DIAGNOSIS — E103593 Type 1 diabetes mellitus with proliferative diabetic retinopathy without macular edema, bilateral: Secondary | ICD-10-CM | POA: Diagnosis not present

## 2023-02-09 DIAGNOSIS — H43813 Vitreous degeneration, bilateral: Secondary | ICD-10-CM | POA: Diagnosis not present

## 2023-02-09 DIAGNOSIS — H26493 Other secondary cataract, bilateral: Secondary | ICD-10-CM | POA: Diagnosis not present

## 2023-02-09 DIAGNOSIS — H3582 Retinal ischemia: Secondary | ICD-10-CM | POA: Diagnosis not present

## 2023-02-12 ENCOUNTER — Other Ambulatory Visit: Payer: Self-pay | Admitting: Adult Health

## 2023-02-12 DIAGNOSIS — I5032 Chronic diastolic (congestive) heart failure: Secondary | ICD-10-CM

## 2023-02-12 NOTE — Telephone Encounter (Signed)
Open in error

## 2023-02-15 DIAGNOSIS — R296 Repeated falls: Secondary | ICD-10-CM | POA: Diagnosis not present

## 2023-02-15 DIAGNOSIS — R0602 Shortness of breath: Secondary | ICD-10-CM | POA: Diagnosis not present

## 2023-02-21 ENCOUNTER — Ambulatory Visit: Payer: Medicare Other | Admitting: Pulmonary Disease

## 2023-02-21 ENCOUNTER — Encounter: Payer: Self-pay | Admitting: Pulmonary Disease

## 2023-02-21 VITALS — HR 55 | Wt 240.0 lb

## 2023-02-21 DIAGNOSIS — R0602 Shortness of breath: Secondary | ICD-10-CM | POA: Diagnosis not present

## 2023-02-21 DIAGNOSIS — J9611 Chronic respiratory failure with hypoxia: Secondary | ICD-10-CM

## 2023-02-21 DIAGNOSIS — R296 Repeated falls: Secondary | ICD-10-CM | POA: Diagnosis not present

## 2023-02-21 NOTE — Patient Instructions (Signed)
It is nice to see you again  We will place new orders for oxygen today via the portable oxygen concentrator  Is okay to not use the [place], I will take it off your list  Continue Flonase as needed as well as the antihistamines like Allegra and Claritin for the seasonal allergies  Return to clinic in 6 months or sooner as needed with Dr. Silas Flood

## 2023-02-21 NOTE — Progress Notes (Signed)
Eyes:  '@Patient'$  ID: [name], female    DOB: [birth year], 72 y.o.   MRN: [id number]  Chief Complaint  Patient presents with   Follow-up    Pt is here for follow up for chronic respiratory failure. Pt is needing updated oxygen order for [employer]. Pt still uses 1L of oxygen via POC. Pt states she was never taught how to use W[place] and never used it but she states that the flonase works well. She is unsure if she needs to use the Wixella.     Referring provider: Eppie Gibson, MD  HPI:   72 y.o. woman whom are seen in follow up for evaluation of pulmonary hypertension and hypoxemia.  Most recent cardiology note x2 reviewed.    Returns for routine follow-up.  Cough has primarily been chief complaint..  Cough much better now.  Not really an issue.  She is using Flonase as needed for nasal congestion with cold seasonal allergies etc.  As well as antihistamines.  He never started the [facility] that was prescribed last visit.  Given that cough is well-controlled this is okay, no need to try this now.    HPI at initial visit: Patient with longstanding cardiac history including intermittent volume overload.  Atrial fibrillation.  On anticoagulation.  Has longstanding dyspnea.  Shortness of breath.  However, she is been largely wheelchair-bound for a couple years.  Following strokes.  She is on oxygen for 6 months.  This is on discharge from the hospital.  Hospital discharge summary 01/2022 reviewed at the time she was placed on oxygen.  At that time she was diuresed.  TTE estimated elevated PASP.  Breathing about the same.  She reports good adherence to oxygen.  She has not used BiPAP.  She has not used in some time.  Does not use regularly.  Despite rest and the importance of using this.  At last hospitalization she was asked to do this.  She is afraid for a few days and does not use it reliably and months.  Reviewed most recent chest x-ray 05/2019.  Shows enlarged cardiac silhouette,  prominent interstitium suggestive of volume overload on my interpretation.  PMH: ESRD on HD, atrial fibrillation, diabetes, hypothyroidism, CVA Surgical history: Hysterectomy, ankle fracture surgery, Family history: Mother with CAD, hyperlipidemia, hypertension, renal disease, father with CVA Social history: Never smoker, lives in [place] / Pulmonary Flowsheets:   ACT:      No data to display           MMRC:     No data to display           [facility]:      No data to display           Tests:   FENO:  No results found for: "NITRICOXIDE"  PFT:     No data to display           WALK:      No data to display           Imaging: Personally reviewed and as per EMR discussion this note No results found.  Lab Results: Personally reviewed CBC    Component Value Date/Time   WBC 4.7 05/29/2022 1326   RBC 3.83 (L) 05/29/2022 1326   HGB 10.9 (L) 05/29/2022 1326   HGB 11.0 (L) 03/02/2022 1548   HCT 35.5 (L) 05/29/2022 1326   HCT 33.8 (L) 03/02/2022 1548   PLT 203 05/29/2022 1326   PLT  185 03/02/2022 1548   MCV 92.7 05/29/2022 1326   MCV 88 03/02/2022 1548   MCH 28.5 05/29/2022 1326   MCHC 30.7 05/29/2022 1326   RDW 15.1 05/29/2022 1326   RDW 12.9 03/02/2022 1548   LYMPHSABS 1.0 02/10/2022 1212   LYMPHSABS 0.9 05/21/2019 0947   MONOABS 0.6 02/10/2022 1212   EOSABS 0.1 02/10/2022 1212   EOSABS 0.0 05/21/2019 0947   BASOSABS 0.0 02/10/2022 1212   BASOSABS 0.0 05/21/2019 0947    BMET    Component Value Date/Time   NA 142 05/29/2022 1326   NA 145 (H) 03/02/2022 1548   K 3.6 05/29/2022 1326   CL 105 05/29/2022 1326   CO2 29 05/29/2022 1326   GLUCOSE 228 (H) 05/29/2022 1326   BUN 10 05/29/2022 1326   BUN 13 03/02/2022 1548   CREATININE 1.07 (H) 05/29/2022 1326   CREATININE 1.03 (H) 08/24/2015 1652   CALCIUM 9.1 05/29/2022 1326   GFRNONAA 56 (L) 05/29/2022 1326   GFRAA >60 06/25/2020 1131    BNP    Component  Value Date/Time   BNP 190.5 (H) 05/29/2022 1326    ProBNP No results found for: "PROBNP"  Specialty Problems       Pulmonary Problems   OSA (obstructive sleep apnea)    BiPAP at home Severe Range 05/27/2019: need f/u with sleep clinic in 10 weeks- not yet bringing patients in for in-lab testing for CPAP titration studies, due to the virus pandemic> autoPAP       Dyspnea   Hypoxia   SOB (shortness of breath)    Allergies  Allergen Reactions   Latex Rash    Immunization History  Administered Date(s) Administered   Fluad Quad(high Dose 65+) 11/16/2021   Influenza, High Dose Seasonal PF 10/12/2017   Influenza,inj,Quad PF,6+ Mos 08/20/2013, 09/30/2014, 09/22/2015, 09/24/2016, 12/09/2018, 09/24/2019   Influenza-Unspecified 09/24/2017   PFIZER(Purple Top)SARS-COV-2 Vaccination 06/28/2020   Pneumococcal Conjugate-13 11/24/2016   Pneumococcal Polysaccharide-23 01/25/2012, 06/07/2018   Tdap 01/25/2012   Zoster Recombinat (Shingrix) 04/11/2017    Past Medical History:  Diagnosis Date   Abnormal echocardiogram    Anemia    Arthritis    BPPV (benign paroxysmal positional vertigo), right 06/13/2017   Bradycardia 03/07/2020   Cerebrovascular accident (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 ([place]) 10/29/2015   CHB (complete heart block) ([place]) 03/08/2020   CHF (congestive heart failure) (HCC)    Closed fracture of left proximal humerus 07/08/2019   Decreased range of motion of left shoulder 09/03/2020   Depression    Dizzy 03/07/2020   bradycardia   Edentulous 09/03/2020   GERD (gastroesophageal reflux disease)    Hemiparesis affecting left side as late effect of cerebrovascular accident (CVA) ([place]) 03/08/2020   Hemorrhoids    History of Falls with injury 03/08/2020   Fall resulting in left humeral fracture   Hyperlipidemia    Hypertension    Hypothyroidism    Intracranial vascular stenosis    Lacunar infarction ([place])    Brain MRI 07/2021 multiple small remote pontine  lacunar infarcts   Near syncope 03/08/2020   Paroxysmal atrial fibrillation (H[facility])    Presence of permanent cardiac pacemaker    Proteinuria 03/08/2020   Seasonal allergies    Stroke ([place])    Thyroid cancer ([place])    per pt S/p Total Thyroidectomy with Radioactive Iodine Therapy   Type 1 diabetes mellitus ([place])     Tobacco History: Social History   Tobacco Use  Smoking Status Never  Smokeless Tobacco Never  Counseling given: Not Answered   Continue to not smoke  Outpatient Encounter Medications as of 02/21/2023  Medication Sig   acetaminophen (TYLENOL) 325 MG tablet Take 2 tablets (650 mg total) by mouth every 6 (six) hours as needed for moderate pain.   amLODipine (NORVASC) 10 MG tablet TAKE 1 TABLET(10 MG) BY MOUTH DAILY   apixaban (ELIQUIS) 5 MG TABS tablet Take 1 tablet (5 mg total) by mouth 2 (two) times daily.   atorvastatin (LIPITOR) 80 MG tablet TAKE 1 TABLET(80 MG) BY MOUTH EVERY EVENING   busPIRone (BUSPAR) 5 MG tablet TAKE 1 TABLET(5 MG) BY MOUTH THREE TIMES DAILY   cholecalciferol (VITAMIN D3) 25 MCG (1000 UNIT) tablet Take 1,000 Units by mouth daily.   Cranberry 50 MG CHEW Chew by mouth.   Fexofenadine HCl (ALLEGRA PO) Take 10 mg by mouth 2 (two) times daily.   fluticasone (FLONASE) 50 MCG/ACT nasal spray Place 1 spray into both nostrils in the morning and at bedtime.   furosemide (LASIX) 40 MG tablet Take 1 tablet (40 mg total) by mouth 2 (two) times daily.   gabapentin (NEURONTIN) 300 MG capsule Take 1 capsule (300 mg total) by mouth as needed.   insulin aspart (NOVOLOG) 100 UNIT/ML injection Inject 5 Units into the skin 3 (three) times daily before meals. For blood sugar greater than or equal to 200   Insulin Human (INSULIN PUMP) SOLN Inject 1 each into the skin 3 times daily with meals, bedtime and 2 AM.   levothyroxine (SYNTHROID) 175 MCG tablet TAKE 1 TABLET(175 MCG) BY MOUTH DAILY BEFORE BREAKFAST   loratadine (CLARITIN) 10 MG tablet Take 10 mg by mouth every  evening.   metoprolol succinate (TOPROL-XL) 50 MG 24 hr tablet Take 1 tablet (50 mg total) by mouth daily.   multivitamin-lutein (OCUVITE-LUTEIN) CAPS capsule Take 1 capsule by mouth daily.   Omega-3 Fatty Acids (FISH OIL) 300 MG CAPS Take by mouth.   pantoprazole (PROTONIX) 40 MG tablet Take 1 tablet (40 mg total) by mouth 2 (two) times daily.   polyethylene glycol powder (GLYCOLAX/MIRALAX) 17 GM/SCOOP powder Take 17 g by mouth 2 (two) times daily as needed for moderate constipation.   potassium chloride (KLOR-CON) 10 MEQ tablet Take 1 tablet (10 mEq total) by mouth daily.   traMADol (ULTRAM) 50 MG tablet Take 1 tablet (50 mg total) by mouth every 6 (six) hours as needed.   fluticasone-salmeterol (ADVAIR) 500-50 MCG/ACT AEPB Inhale 1 puff into the lungs in the morning and at bedtime. (Patient not taking: Reported on 02/21/2023)   losartan (COZAAR) 25 MG tablet Take 1 tablet (25 mg total) by mouth daily.   No facility-administered encounter medications on file as of 02/21/2023.     Review of Systems  Review of Systems  N/a Physical Exam  Pulse (!) 55   Wt 240 lb (108.9 kg)   SpO2 94%   BMI 37.59 kg/m   Wt Readings from Last 5 Encounters:  02/21/23 240 lb (108.9 kg)  02/06/23 240 lb (108.9 kg)  09/11/22 253 lb (114.8 kg)  07/27/22 253 lb (114.8 kg)  03/02/22 243 lb (110.2 kg)    BMI Readings from Last 5 Encounters:  02/21/23 37.59 kg/m  02/06/23 37.59 kg/m  11/07/22 39.63 kg/m  09/11/22 39.63 kg/m  07/27/22 39.63 kg/m     Physical Exam General: Sitting in chair, chronically ill-appearing EOMI, no icterus Neck: Supple, JVP difficult to assess given habitus Pulmonary: Clear, normal work of breathing Cardiovascular: Warm, significant lower extremity edema  noted Abdomen: Nondistended, bowel sounds present MSK: No synovitis, no joint effusion Neuro: Normal gait, global but symmetric weakness in lower extremities, upper extremities okay Psych: Normal mood, full  affect   Assessment & Plan:   Presumed pulmonary hypertension: Based on echocardiogram findings with elevated right-sided pressures.  Likely multifactorial due to group 2 disease given the signs of left-sided volume overload on the chest imaging in the past, group 2 disease in setting of hypoxemia and OSA nonadherent to NIPPV, group 5 disease in the setting of hemodialysis.  Unfortunate she is a very poor candidate for vasodilators given the multifactorial nature of her disease.  She is nonadherent to BiPAP therapy which was explained to her is a foundational treatment of group 3 disease related to OSA.  Furthermore, she is essentially wheelchair-bound due to prior strokes and weakness.  Do not think she would benefit much in terms of dyspnea with pulmonary vasodilators and given the risk she is a poor candidate.  In the past I have encouraged her to reengage with sleep doctor.  Could pursue with her nephrologist assessing flows through fistula and if elevated evaluating possibility of banding of fistula to reduce flows to decrease stress on the RV.  Chronic hypoxemic respiratory failure: Likely multifactorial related related to OHS, VQ mismatch with presumed pulmonary hypertension, and intermittent volume overload.  Requalify, new order for oxygen today.  Chronic cough: Previously endorsed postnasal drip, nasal congestion.  Addition of Flonase 1 spray each nostril twice daily as well as antihistamines have led to improvement. Never tried ICS/LABA, Advair/Wixela prescribed at last visit.  This inhaler as been removed from her medication list.   Return in about 6 months (around 08/22/2023).   Lanier Clam, MD 02/21/2023

## 2023-02-22 DIAGNOSIS — D72819 Decreased white blood cell count, unspecified: Secondary | ICD-10-CM | POA: Diagnosis not present

## 2023-03-02 ENCOUNTER — Ambulatory Visit (HOSPITAL_COMMUNITY)
Admission: RE | Admit: 2023-03-02 | Discharge: 2023-03-02 | Disposition: A | Discharge status: Home / Self Care | Payer: Medicare Other | Source: Ambulatory Visit | Attending: Internal Medicine | Admitting: Internal Medicine

## 2023-03-02 DIAGNOSIS — Z993 Dependence on wheelchair: Secondary | ICD-10-CM | POA: Insufficient documentation

## 2023-03-02 DIAGNOSIS — E119 Type 2 diabetes mellitus without complications: Secondary | ICD-10-CM | POA: Insufficient documentation

## 2023-03-02 DIAGNOSIS — I5032 Chronic diastolic (congestive) heart failure: Secondary | ICD-10-CM | POA: Insufficient documentation

## 2023-03-02 DIAGNOSIS — I358 Other nonrheumatic aortic valve disorders: Secondary | ICD-10-CM | POA: Diagnosis not present

## 2023-03-02 DIAGNOSIS — R0602 Shortness of breath: Secondary | ICD-10-CM | POA: Diagnosis not present

## 2023-03-02 DIAGNOSIS — Z8673 Personal history of transient ischemic attack (TIA), and cerebral infarction without residual deficits: Secondary | ICD-10-CM | POA: Insufficient documentation

## 2023-03-02 DIAGNOSIS — E785 Hyperlipidemia, unspecified: Secondary | ICD-10-CM | POA: Diagnosis not present

## 2023-03-02 DIAGNOSIS — I480 Paroxysmal atrial fibrillation: Secondary | ICD-10-CM | POA: Diagnosis not present

## 2023-03-02 DIAGNOSIS — I110 Hypertensive heart disease with heart failure: Secondary | ICD-10-CM | POA: Diagnosis not present

## 2023-03-02 DIAGNOSIS — I442 Atrioventricular block, complete: Secondary | ICD-10-CM | POA: Insufficient documentation

## 2023-03-02 DIAGNOSIS — Z950 Presence of cardiac pacemaker: Secondary | ICD-10-CM | POA: Insufficient documentation

## 2023-03-02 LAB — ECHOCARDIOGRAM COMPLETE
AR max vel: 1.53 cm2
AV Area VTI: 1.4 cm2
AV Area mean vel: 1.57 cm2
AV Mean grad: 4 mmHg
AV Peak grad: 8.2 mmHg
Ao pk vel: 1.43 m/s
Area-P 1/2: 2.32 cm2
Calc EF: 69.3 %
S' Lateral: 2.8 cm
Single Plane A2C EF: 76.5 %
Single Plane A4C EF: 59.8 %

## 2023-03-03 DIAGNOSIS — E109 Type 1 diabetes mellitus without complications: Secondary | ICD-10-CM | POA: Diagnosis not present

## 2023-03-03 DIAGNOSIS — Z794 Long term (current) use of insulin: Secondary | ICD-10-CM | POA: Diagnosis not present

## 2023-03-05 ENCOUNTER — Telehealth: Payer: Self-pay | Admitting: Pulmonary Disease

## 2023-03-05 DIAGNOSIS — G4733 Obstructive sleep apnea (adult) (pediatric): Secondary | ICD-10-CM

## 2023-03-05 DIAGNOSIS — J9611 Chronic respiratory failure with hypoxia: Secondary | ICD-10-CM

## 2023-03-05 NOTE — Telephone Encounter (Signed)
Pt. Calling saying [employer] or [employer] need a order for adaptor for the machine to be connected together

## 2023-03-06 NOTE — Telephone Encounter (Signed)
Spoke with pt who states that she needs a order placed to [employer] for connector piece to bleed O2 into C-Pap. Dr. Silas Flood please advise.

## 2023-03-06 NOTE — Telephone Encounter (Signed)
Patient is returning phone call. Patient phone number is [phone].

## 2023-03-06 NOTE — Telephone Encounter (Signed)
Called patient but she did not answer. Left message for her to call back.  

## 2023-03-06 NOTE — Telephone Encounter (Signed)
[place] for order to be placed - thanks!

## 2023-03-06 NOTE — Telephone Encounter (Signed)
Called and spoke with patient. She verbalized understanding. Order has been placed.   Nothing further needed at time of call.

## 2023-03-07 NOTE — Progress Notes (Signed)
normal

## 2023-03-07 NOTE — Progress Notes (Signed)
LMTCB

## 2023-03-07 NOTE — Telephone Encounter (Signed)
[facility] LVMTCB X1 sent letter via My Chart. Closing encounter per office policy.

## 2023-03-07 NOTE — Telephone Encounter (Signed)
Rerouting to Triage for 2nd attempt to call to advise PT.

## 2023-03-08 DIAGNOSIS — E890 Postprocedural hypothyroidism: Secondary | ICD-10-CM | POA: Diagnosis not present

## 2023-03-08 DIAGNOSIS — Z9641 Presence of insulin pump (external) (internal): Secondary | ICD-10-CM | POA: Diagnosis not present

## 2023-03-08 DIAGNOSIS — L97909 Non-pressure chronic ulcer of unspecified part of unspecified lower leg with unspecified severity: Secondary | ICD-10-CM | POA: Diagnosis not present

## 2023-03-08 DIAGNOSIS — I10 Essential (primary) hypertension: Secondary | ICD-10-CM | POA: Diagnosis not present

## 2023-03-08 DIAGNOSIS — C73 Malignant neoplasm of thyroid gland: Secondary | ICD-10-CM | POA: Diagnosis not present

## 2023-03-08 DIAGNOSIS — E109 Type 1 diabetes mellitus without complications: Secondary | ICD-10-CM | POA: Diagnosis not present

## 2023-03-08 DIAGNOSIS — E7800 Pure hypercholesterolemia, unspecified: Secondary | ICD-10-CM | POA: Diagnosis not present

## 2023-03-08 DIAGNOSIS — I639 Cerebral infarction, unspecified: Secondary | ICD-10-CM | POA: Diagnosis not present

## 2023-03-08 DIAGNOSIS — Z794 Long term (current) use of insulin: Secondary | ICD-10-CM | POA: Diagnosis not present

## 2023-03-08 DIAGNOSIS — E11319 Type 2 diabetes mellitus with unspecified diabetic retinopathy without macular edema: Secondary | ICD-10-CM | POA: Diagnosis not present

## 2023-03-08 NOTE — Progress Notes (Signed)
Called and spoke with patient regarding her echocardiogram results.

## 2023-03-16 DIAGNOSIS — R0602 Shortness of breath: Secondary | ICD-10-CM | POA: Diagnosis not present

## 2023-03-16 DIAGNOSIS — R296 Repeated falls: Secondary | ICD-10-CM | POA: Diagnosis not present

## 2023-03-22 DIAGNOSIS — R0602 Shortness of breath: Secondary | ICD-10-CM | POA: Diagnosis not present

## 2023-03-22 DIAGNOSIS — R296 Repeated falls: Secondary | ICD-10-CM | POA: Diagnosis not present

## 2023-03-27 DIAGNOSIS — Z794 Long term (current) use of insulin: Secondary | ICD-10-CM | POA: Diagnosis not present

## 2023-03-27 DIAGNOSIS — E109 Type 1 diabetes mellitus without complications: Secondary | ICD-10-CM | POA: Diagnosis not present

## 2023-04-05 ENCOUNTER — Ambulatory Visit (INDEPENDENT_AMBULATORY_CARE_PROVIDER_SITE_OTHER): Payer: Medicare Other

## 2023-04-05 DIAGNOSIS — I442 Atrioventricular block, complete: Secondary | ICD-10-CM

## 2023-04-05 LAB — CUP PACEART REMOTE DEVICE CHECK
Battery Remaining Longevity: 108 mo
Battery Voltage: 3.01 V
Brady Statistic AP VP Percent: 50.1 %
Brady Statistic AP VS Percent: 0.08 %
Brady Statistic AS VP Percent: 49.42 %
Brady Statistic AS VS Percent: 0.38 %
Brady Statistic RA Percent Paced: 47.32 %
Brady Statistic RV Percent Paced: 99.53 %
Date Time Interrogation Session: 20240410212050
Implantable Lead Connection Status: 753985
Implantable Lead Connection Status: 753985
Implantable Lead Implant Date: 20210702
Implantable Lead Implant Date: 20210702
Implantable Lead Location: 753859
Implantable Lead Location: 753860
Implantable Lead Model: 5076
Implantable Lead Model: 5076
Implantable Pulse Generator Implant Date: 20210702
Lead Channel Impedance Value: 266 Ohm
Lead Channel Impedance Value: 361 Ohm
Lead Channel Impedance Value: 361 Ohm
Lead Channel Impedance Value: 437 Ohm
Lead Channel Pacing Threshold Amplitude: 0.75 V
Lead Channel Pacing Threshold Amplitude: 0.75 V
Lead Channel Pacing Threshold Pulse Width: 0.4 ms
Lead Channel Pacing Threshold Pulse Width: 0.4 ms
Lead Channel Sensing Intrinsic Amplitude: 4.25 mV
Lead Channel Sensing Intrinsic Amplitude: 4.25 mV
Lead Channel Sensing Intrinsic Amplitude: 7.625 mV
Lead Channel Sensing Intrinsic Amplitude: 7.625 mV
Lead Channel Setting Pacing Amplitude: 1.5 V
Lead Channel Setting Pacing Amplitude: 2.5 V
Lead Channel Setting Pacing Pulse Width: 0.4 ms
Lead Channel Setting Sensing Sensitivity: 1.2 mV
Zone Setting Status: 755011
Zone Setting Status: 755011

## 2023-04-10 ENCOUNTER — Ambulatory Visit: Payer: Medicare Other | Admitting: Podiatry

## 2023-04-10 DIAGNOSIS — H26492 Other secondary cataract, left eye: Secondary | ICD-10-CM | POA: Diagnosis not present

## 2023-04-10 DIAGNOSIS — Z794 Long term (current) use of insulin: Secondary | ICD-10-CM | POA: Diagnosis not present

## 2023-04-10 DIAGNOSIS — E113593 Type 2 diabetes mellitus with proliferative diabetic retinopathy without macular edema, bilateral: Secondary | ICD-10-CM | POA: Diagnosis not present

## 2023-04-10 DIAGNOSIS — H26493 Other secondary cataract, bilateral: Secondary | ICD-10-CM | POA: Diagnosis not present

## 2023-04-10 DIAGNOSIS — H18413 Arcus senilis, bilateral: Secondary | ICD-10-CM | POA: Diagnosis not present

## 2023-04-10 DIAGNOSIS — Z961 Presence of intraocular lens: Secondary | ICD-10-CM | POA: Diagnosis not present

## 2023-04-13 ENCOUNTER — Ambulatory Visit: Payer: Medicare Other | Admitting: Podiatry

## 2023-04-13 ENCOUNTER — Encounter: Payer: Self-pay | Admitting: Podiatry

## 2023-04-13 ENCOUNTER — Other Ambulatory Visit: Payer: Self-pay | Admitting: Student

## 2023-04-13 DIAGNOSIS — B351 Tinea unguium: Secondary | ICD-10-CM

## 2023-04-13 DIAGNOSIS — E1159 Type 2 diabetes mellitus with other circulatory complications: Secondary | ICD-10-CM | POA: Diagnosis not present

## 2023-04-13 DIAGNOSIS — M79674 Pain in right toe(s): Secondary | ICD-10-CM

## 2023-04-13 DIAGNOSIS — M79675 Pain in left toe(s): Secondary | ICD-10-CM | POA: Diagnosis not present

## 2023-04-13 DIAGNOSIS — E1069 Type 1 diabetes mellitus with other specified complication: Secondary | ICD-10-CM

## 2023-04-13 NOTE — Progress Notes (Signed)
This patient returns to my office for at risk foot care.  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  This patient is unable to cut nails herself  since the patient cannot reach her  nails.These nails are painful walking and wearing shoes.  Patient presents in a wheelchair  with her granddaughter..  This patient presents for at risk foot care today.  General Appearance  Alert, conversant and in no acute stress.  Vascular  Dorsalis pedis and posterior tibial  pulses are absent due to swelling. bilaterally.  Capillary return is within normal limits  bilaterally. Temperature is within normal limits  bilaterally.  Neurologic  Senn-Weinstein monofilament wire diminished  bilaterally. Muscle power within normal limits bilaterally.  Nails Thick disfigured discolored nails with subungual debris  from hallux to fifth toes bilaterally. No evidence of bacterial infection or drainage bilaterally.  Orthopedic  No limitations of motion  feet .  No crepitus or effusions noted.  No bony pathology or digital deformities noted.  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  Hallux  malleus  B/L  Skin  normotropic skin with no porokeratosis noted bilaterally.  Skin lesion on dorsum of hallux malleus  B/L.    Onychomycosis  Pain in right toes  Pain in left toes  Consent was obtained for treatment procedures.   Mechanical debridement of nails 1-5  bilaterally performed with a nail nipper.  Filed with dremel without incident. She is wearing band-aids over her hallux malleus  right foot.   Return office visit    3 months       Told patient to return for periodic foot care and evaluation due to potential at risk complications.   Helane Gunther DPM

## 2023-04-16 DIAGNOSIS — R296 Repeated falls: Secondary | ICD-10-CM | POA: Diagnosis not present

## 2023-04-16 DIAGNOSIS — R0602 Shortness of breath: Secondary | ICD-10-CM | POA: Diagnosis not present

## 2023-04-20 DIAGNOSIS — E1065 Type 1 diabetes mellitus with hyperglycemia: Secondary | ICD-10-CM | POA: Diagnosis not present

## 2023-04-20 DIAGNOSIS — E109 Type 1 diabetes mellitus without complications: Secondary | ICD-10-CM | POA: Diagnosis not present

## 2023-04-22 DIAGNOSIS — R296 Repeated falls: Secondary | ICD-10-CM | POA: Diagnosis not present

## 2023-04-22 DIAGNOSIS — R0602 Shortness of breath: Secondary | ICD-10-CM | POA: Diagnosis not present

## 2023-04-23 DIAGNOSIS — E1165 Type 2 diabetes mellitus with hyperglycemia: Secondary | ICD-10-CM | POA: Diagnosis not present

## 2023-04-23 DIAGNOSIS — E1322 Other specified diabetes mellitus with diabetic chronic kidney disease: Secondary | ICD-10-CM | POA: Diagnosis not present

## 2023-04-23 DIAGNOSIS — N1831 Chronic kidney disease, stage 3a: Secondary | ICD-10-CM | POA: Diagnosis not present

## 2023-04-23 DIAGNOSIS — S99922A Unspecified injury of left foot, initial encounter: Secondary | ICD-10-CM | POA: Diagnosis not present

## 2023-04-23 DIAGNOSIS — D649 Anemia, unspecified: Secondary | ICD-10-CM | POA: Diagnosis not present

## 2023-04-23 DIAGNOSIS — J9611 Chronic respiratory failure with hypoxia: Secondary | ICD-10-CM | POA: Diagnosis not present

## 2023-04-23 DIAGNOSIS — Z993 Dependence on wheelchair: Secondary | ICD-10-CM | POA: Diagnosis not present

## 2023-04-23 DIAGNOSIS — I5030 Unspecified diastolic (congestive) heart failure: Secondary | ICD-10-CM | POA: Diagnosis not present

## 2023-04-23 DIAGNOSIS — I4891 Unspecified atrial fibrillation: Secondary | ICD-10-CM | POA: Diagnosis not present

## 2023-04-23 DIAGNOSIS — J449 Chronic obstructive pulmonary disease, unspecified: Secondary | ICD-10-CM | POA: Diagnosis not present

## 2023-04-23 DIAGNOSIS — I272 Pulmonary hypertension, unspecified: Secondary | ICD-10-CM | POA: Diagnosis not present

## 2023-04-26 DIAGNOSIS — E109 Type 1 diabetes mellitus without complications: Secondary | ICD-10-CM | POA: Diagnosis not present

## 2023-04-26 DIAGNOSIS — Z794 Long term (current) use of insulin: Secondary | ICD-10-CM | POA: Diagnosis not present

## 2023-05-08 DIAGNOSIS — I480 Paroxysmal atrial fibrillation: Secondary | ICD-10-CM | POA: Diagnosis not present

## 2023-05-08 DIAGNOSIS — E1322 Other specified diabetes mellitus with diabetic chronic kidney disease: Secondary | ICD-10-CM | POA: Diagnosis not present

## 2023-05-08 DIAGNOSIS — I272 Pulmonary hypertension, unspecified: Secondary | ICD-10-CM | POA: Diagnosis not present

## 2023-05-08 DIAGNOSIS — I130 Hypertensive heart and chronic kidney disease with heart failure and stage 1 through stage 4 chronic kidney disease, or unspecified chronic kidney disease: Secondary | ICD-10-CM | POA: Diagnosis not present

## 2023-05-08 DIAGNOSIS — J449 Chronic obstructive pulmonary disease, unspecified: Secondary | ICD-10-CM | POA: Diagnosis not present

## 2023-05-08 DIAGNOSIS — E1365 Other specified diabetes mellitus with hyperglycemia: Secondary | ICD-10-CM | POA: Diagnosis not present

## 2023-05-08 DIAGNOSIS — I5032 Chronic diastolic (congestive) heart failure: Secondary | ICD-10-CM | POA: Diagnosis not present

## 2023-05-08 DIAGNOSIS — D631 Anemia in chronic kidney disease: Secondary | ICD-10-CM | POA: Diagnosis not present

## 2023-05-08 DIAGNOSIS — I69354 Hemiplegia and hemiparesis following cerebral infarction affecting left non-dominant side: Secondary | ICD-10-CM | POA: Diagnosis not present

## 2023-05-08 DIAGNOSIS — J9611 Chronic respiratory failure with hypoxia: Secondary | ICD-10-CM | POA: Diagnosis not present

## 2023-05-08 DIAGNOSIS — N189 Chronic kidney disease, unspecified: Secondary | ICD-10-CM | POA: Diagnosis not present

## 2023-05-08 DIAGNOSIS — E039 Hypothyroidism, unspecified: Secondary | ICD-10-CM | POA: Diagnosis not present

## 2023-05-11 DIAGNOSIS — Z794 Long term (current) use of insulin: Secondary | ICD-10-CM | POA: Diagnosis not present

## 2023-05-11 DIAGNOSIS — E109 Type 1 diabetes mellitus without complications: Secondary | ICD-10-CM | POA: Diagnosis not present

## 2023-05-11 NOTE — Progress Notes (Signed)
Remote pacemaker transmission.   

## 2023-05-16 DIAGNOSIS — R296 Repeated falls: Secondary | ICD-10-CM | POA: Diagnosis not present

## 2023-05-16 DIAGNOSIS — R0602 Shortness of breath: Secondary | ICD-10-CM | POA: Diagnosis not present

## 2023-05-17 DIAGNOSIS — E113513 Type 2 diabetes mellitus with proliferative diabetic retinopathy with macular edema, bilateral: Secondary | ICD-10-CM | POA: Diagnosis not present

## 2023-05-22 DIAGNOSIS — R0602 Shortness of breath: Secondary | ICD-10-CM | POA: Diagnosis not present

## 2023-05-22 DIAGNOSIS — R296 Repeated falls: Secondary | ICD-10-CM | POA: Diagnosis not present

## 2023-05-26 DIAGNOSIS — Z794 Long term (current) use of insulin: Secondary | ICD-10-CM | POA: Diagnosis not present

## 2023-05-26 DIAGNOSIS — E109 Type 1 diabetes mellitus without complications: Secondary | ICD-10-CM | POA: Diagnosis not present

## 2023-05-29 DIAGNOSIS — I69354 Hemiplegia and hemiparesis following cerebral infarction affecting left non-dominant side: Secondary | ICD-10-CM | POA: Diagnosis not present

## 2023-05-29 DIAGNOSIS — I5032 Chronic diastolic (congestive) heart failure: Secondary | ICD-10-CM | POA: Diagnosis not present

## 2023-05-29 DIAGNOSIS — E1365 Other specified diabetes mellitus with hyperglycemia: Secondary | ICD-10-CM | POA: Diagnosis not present

## 2023-05-29 DIAGNOSIS — I130 Hypertensive heart and chronic kidney disease with heart failure and stage 1 through stage 4 chronic kidney disease, or unspecified chronic kidney disease: Secondary | ICD-10-CM | POA: Diagnosis not present

## 2023-05-29 DIAGNOSIS — J449 Chronic obstructive pulmonary disease, unspecified: Secondary | ICD-10-CM | POA: Diagnosis not present

## 2023-05-29 DIAGNOSIS — E1322 Other specified diabetes mellitus with diabetic chronic kidney disease: Secondary | ICD-10-CM | POA: Diagnosis not present

## 2023-05-30 ENCOUNTER — Encounter (HOSPITAL_COMMUNITY): Payer: Self-pay

## 2023-05-30 ENCOUNTER — Emergency Department (HOSPITAL_COMMUNITY)
Admission: EM | Admit: 2023-05-30 | Discharge: 2023-05-30 | Disposition: A | Discharge status: Home / Self Care | Payer: Medicare Other | Attending: Emergency Medicine | Admitting: Emergency Medicine

## 2023-05-30 ENCOUNTER — Emergency Department (HOSPITAL_COMMUNITY): Payer: Medicare Other

## 2023-05-30 ENCOUNTER — Other Ambulatory Visit: Payer: Self-pay

## 2023-05-30 DIAGNOSIS — M25552 Pain in left hip: Secondary | ICD-10-CM | POA: Insufficient documentation

## 2023-05-30 DIAGNOSIS — I509 Heart failure, unspecified: Secondary | ICD-10-CM | POA: Diagnosis not present

## 2023-05-30 DIAGNOSIS — E119 Type 2 diabetes mellitus without complications: Secondary | ICD-10-CM | POA: Diagnosis not present

## 2023-05-30 DIAGNOSIS — Z950 Presence of cardiac pacemaker: Secondary | ICD-10-CM | POA: Insufficient documentation

## 2023-05-30 DIAGNOSIS — Z91040 Latex allergy status: Secondary | ICD-10-CM | POA: Diagnosis not present

## 2023-05-30 DIAGNOSIS — I110 Hypertensive heart disease with heart failure: Secondary | ICD-10-CM | POA: Insufficient documentation

## 2023-05-30 DIAGNOSIS — Z794 Long term (current) use of insulin: Secondary | ICD-10-CM | POA: Insufficient documentation

## 2023-05-30 DIAGNOSIS — Z743 Need for continuous supervision: Secondary | ICD-10-CM | POA: Diagnosis not present

## 2023-05-30 DIAGNOSIS — Z79899 Other long term (current) drug therapy: Secondary | ICD-10-CM | POA: Diagnosis not present

## 2023-05-30 DIAGNOSIS — R600 Localized edema: Secondary | ICD-10-CM | POA: Diagnosis not present

## 2023-05-30 DIAGNOSIS — M25562 Pain in left knee: Secondary | ICD-10-CM | POA: Diagnosis not present

## 2023-05-30 DIAGNOSIS — R6889 Other general symptoms and signs: Secondary | ICD-10-CM | POA: Diagnosis not present

## 2023-05-30 DIAGNOSIS — Z7901 Long term (current) use of anticoagulants: Secondary | ICD-10-CM | POA: Diagnosis not present

## 2023-05-30 DIAGNOSIS — S79812A Other specified injuries of left hip, initial encounter: Secondary | ICD-10-CM | POA: Diagnosis not present

## 2023-05-30 DIAGNOSIS — R531 Weakness: Secondary | ICD-10-CM | POA: Diagnosis not present

## 2023-05-30 DIAGNOSIS — I10 Essential (primary) hypertension: Secondary | ICD-10-CM | POA: Diagnosis not present

## 2023-05-30 MED ORDER — LIDOCAINE 5 % EX PTCH
1.0000 | MEDICATED_PATCH | CUTANEOUS | Status: DC
  Administered 2023-05-30: 1 via TRANSDERMAL
  Filled 2023-05-30: qty 1

## 2023-05-30 MED ORDER — LORAZEPAM 0.5 MG PO TABS: 0.5000 mg | ORAL_TABLET | ORAL | Status: DC

## 2023-05-30 MED ORDER — HYDROCODONE-ACETAMINOPHEN 5-325 MG PO TABS
1.0000 | ORAL_TABLET | Freq: Once | ORAL | Status: AC
  Administered 2023-05-30: 1 via ORAL
  Filled 2023-05-30: qty 1

## 2023-05-30 NOTE — Progress Notes (Signed)
Orthopedic Tech Progress Note Patient Details:  [name] 11-Sep-1951 [id number]  Ortho Devices Type of Ortho Device: Ankle Air splint Ortho Device/Splint Location: Left ankle Ortho Device/Splint Interventions: Application   Post Interventions Patient Tolerated: Well  Genelle Bal Kayci [place] 05/30/2023, 3:47 PM

## 2023-05-30 NOTE — Discharge Instructions (Signed)
You were seen for your hip pain and leg weakness in the emergency department.  Your x-rays did not show any broken bones. You were offered an MRI but wanted to go home before it was done.   At home, please please use Tylenol and lidocaine patches for your hip.    Check your MyChart online for the results of any tests that had not resulted by the time you left the emergency department.   Follow-up with your primary doctor in 2-3 days regarding your visit.  Please talk to them about your leg pain and weakness.  Return immediately to the emergency department if you experience any of the following: Worsening weakness, slurred speech, facial droop, or any other concerning symptoms.    Thank you for visiting our Emergency Department. It was a pleasure taking care of you today.

## 2023-05-30 NOTE — ED Triage Notes (Signed)
Pt presents from home via EMS with c/o left hip pain that onset at 0200. Denies trauma.  Hx of stroke with left side deficits. Distal CMS intact.

## 2023-05-30 NOTE — ED Notes (Signed)
Patient Alert and oriented to baseline. Stable and ambulatory to baseline. Patient verbalized understanding of the discharge instructions.  Patient belongings were taken by the patient.   

## 2023-05-30 NOTE — ED Provider Notes (Signed)
EMERGENCY DEPARTMENT AT [facility] Provider Note   CSN: [id number] Arrival date & time: 05/30/23  4098     History  Chief Complaint  Patient presents with   Hip Pain    left    [facility] [name] is a 72 y.o. female.  72 year old female with a history of baseline hypoxia on 1 L home oxygen, stroke with residual left-sided weakness on Eliquis, CHF and pacemaker dependence (Medtronic), HTN, HLD, and diabetes who presents emergency department with left hip pain.  Says that she was attempting to get into a lift 2 days ago when she started experiencing left-sided hip pain.  Says that it is persisted.  Also experienced some knee pain when she was trying to get into the left.  Says now she is having difficulty rotating her left lower extremity medially due to the pain.  No other trauma.  Says that her pain was severe this morning and so she called 911 to come into the emergency department.  No slurred speech, new weakness or numbness.       Home Medications Prior to Admission medications   Medication Sig Start Date End Date Taking? Authorizing Provider  acetaminophen (TYLENOL) 325 MG tablet Take 2 tablets (650 mg total) by mouth every 6 (six) hours as needed for moderate pain. 08/30/21   Angiulli, Mcarthur Rossetti, PA-C  amLODipine (NORVASC) 10 MG tablet TAKE 1 TABLET(10 MG) BY MOUTH DAILY 12/26/22   Alicia Amel, MD  apixaban (ELIQUIS) 5 MG TABS tablet Take 1 tablet (5 mg total) by mouth 2 (two) times daily. 01/16/23   Custovic, Rozell Searing, DO  atorvastatin (LIPITOR) 80 MG tablet TAKE 1 TABLET(80 MG) BY MOUTH EVERY EVENING 10/23/22   Alicia Amel, MD  busPIRone (BUSPAR) 5 MG tablet TAKE 1 TABLET(5 MG) BY MOUTH THREE TIMES DAILY 02/02/23   Alicia Amel, MD  cholecalciferol (VITAMIN D3) 25 MCG (1000 UNIT) tablet Take 1,000 Units by mouth daily.    [provider]  Cranberry 50 MG CHEW Chew by mouth.    [provider]  Fexofenadine HCl (ALLEGRA PO)  Take 10 mg by mouth 2 (two) times daily.    [provider]  fluticasone (FLONASE) 50 MCG/ACT nasal spray Place 1 spray into both nostrils in the morning and at bedtime. 07/30/22   Hunsucker, Lesia Sago, MD  furosemide (LASIX) 40 MG tablet Take 1 tablet (40 mg total) by mouth 2 (two) times daily. 02/06/23   Custovic, Rozell Searing, DO  gabapentin (NEURONTIN) 300 MG capsule Take 1 capsule (300 mg total) by mouth as needed. 01/09/23   Alicia Amel, MD  insulin aspart (NOVOLOG) 100 UNIT/ML injection Inject 5 Units into the skin 3 (three) times daily before meals. For blood sugar greater than or equal to 200 10/27/21   Medina-Vargas, Monina C, NP  Insulin Human (INSULIN PUMP) SOLN Inject 1 each into the skin 3 times daily with meals, bedtime and 2 AM. 08/10/21   Sabino Dick, DO  levothyroxine (SYNTHROID) 175 MCG tablet TAKE 1 TABLET(175 MCG) BY MOUTH DAILY BEFORE BREAKFAST 12/13/22   Alicia Amel, MD  loratadine (CLARITIN) 10 MG tablet Take 10 mg by mouth every evening.    [provider]  losartan (COZAAR) 25 MG tablet Take 1 tablet (25 mg total) by mouth daily. 11/07/22 02/05/23  Custovic, Rozell Searing, DO  metoprolol succinate (TOPROL-XL) 50 MG 24 hr tablet Take 1 tablet (50 mg total) by mouth daily. 01/16/23   Sheilah Pigeon,  PA-C  multivitamin-lutein (OCUVITE-LUTEIN) CAPS capsule Take 1 capsule by mouth daily.    [provider]  Omega-3 Fatty Acids (FISH OIL) 300 MG CAPS Take by mouth.    [provider]  pantoprazole (PROTONIX) 40 MG tablet Take 1 tablet (40 mg total) by mouth 2 (two) times daily. 10/27/21   Medina-Vargas, Monina C, NP  polyethylene glycol powder (GLYCOLAX/MIRALAX) 17 GM/SCOOP powder Take 17 g by mouth 2 (two) times daily as needed for moderate constipation. 06/21/22   [name], Makiera, MD  potassium chloride (KLOR-CON) 10 MEQ tablet Take 1 tablet (10 mEq total) by mouth daily. 10/27/21   Medina-Vargas, Monina C, NP  traMADol (ULTRAM) 50 MG  tablet Take 1 tablet (50 mg total) by mouth every 6 (six) hours as needed. 07/11/22   Cathleen Corti, MD      Allergies    Latex    Review of Systems   Review of Systems  Physical Exam Updated Vital Signs BP (!) 161/53   Pulse (!) 50   Temp 98.3 F (36.8 C) (Oral)   Resp 16   Ht 5\' 7"  (1.702 m)   Wt 108.9 kg   SpO2 98%   BMI 37.59 kg/m  Physical Exam Vitals and nursing note reviewed.  Constitutional:      General: She is not in acute distress.    Appearance: She is well-developed.     Comments: On 1 L nasal cannula  HENT:     Head: Normocephalic and atraumatic.     Right Ear: External ear normal.     Left Ear: External ear normal.     Nose: Nose normal.  Eyes:     Extraocular Movements: Extraocular movements intact.     Conjunctiva/sclera: Conjunctivae normal.     Pupils: Pupils are equal, round, and reactive to light.  Cardiovascular:     Rate and Rhythm: Normal rate and regular rhythm.     Heart sounds: No murmur heard. Pulmonary:     Effort: Pulmonary effort is normal. No respiratory distress.     Breath sounds: Normal breath sounds.  Abdominal:     General: Abdomen is flat.     Palpations: Abdomen is soft.  Musculoskeletal:     Cervical back: Normal range of motion and neck supple.     Right lower leg: Edema present.     Left lower leg: Edema present.     Comments: Externally rotated left lower extremity but is not shortened.  Tenderness palpation of left hip.  No significant tenderness to palpation of left knee.  Able to flex hip and knee to approximately 30 degrees.  No overlying warmth or effusion.  DP pulse 2+ and left lower extremity and foot appears warm and well-perfused.  Skin:    General: Skin is warm and dry.  Neurological:     Mental Status: She is alert and oriented to person, place, and time. Mental status is at baseline.     Comments: Cranial nerves II through XII grossly intact.  Left upper extremity 4/5 at baseline.  Left lower extremity  4/5 which is at baseline.  Right upper and right lower extremity 5/5 strength.  Intact sensation to light touch in all extremities.  Psychiatric:        Mood and Affect: Mood normal.     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  Procedures Procedures    Medications Ordered in ED Medications  lidocaine (LIDODERM) 5 % 1 patch (1 patch Transdermal Patch Applied 05/30/23 0910)  LORazepam (ATIVAN) tablet 0.5 mg (has no administration in time range)  HYDROcodone-acetaminophen (NORCO/VICODIN) 5-325 MG per tablet 1 tablet (1 tablet Oral Given 05/30/23 0910)    ED Course/ Medical Decision Making/ A&P Clinical Course as of 05/30/23 2039  Wed May 30, 2023  1013 Knee and hip x-ray with arthritis and no acute findings. [RP]  1121 Per Candace says that her leg was hard to move and like dead weight but this weakness is off and on since her stroke. Is typically wheelchair bound. [RP]    Clinical Course User Index [RP] Rondel Baton, MD                             Medical Decision Making Amount and/or Complexity of Data Reviewed Radiology: ordered.  Risk Prescription drug management.   [name] Veryl Speak Davey Mullenax is a 72 y.o. female with comorbidities that complicate the patient evaluation including stroke with residual left-sided deficits, CHF, pacemaker dependence (Medtronic) hypertension, hyperlipidemia, diabetes who presents to the emergency department with left hip pain after developing  into a lift and weakness  Initial Ddx:  Fracture, muscle strain, stroke  MDM:  Concerned about possible fracture or muscle strain/injury causing the patient's symptoms.  Appears that pain limits her exam for hip flexion.  Will obtain x-rays at this time to evaluate for fracture.  Also considering stroke given the fact that she feels like it is weaker but after talking to her family it appears that her weakness is off-and-on since her stroke so very difficult to tell if this is potentially due to new neurologic weakness or just a result of her old stroke.  Plan:  Hip x-ray Knee x-ray MRI brain  ED Summary/Re-evaluation:  Patient's x-rays that showed only osteoporosis.  No evidence of fracture.  Given the low mechanism feel that occult fracture is highly unlikely.  Patient was ordered for MRI but she declined and stated that she wanted to go home.  She did understand that we are looking for a possible stroke with the MRI and still did not want it.  Do feel that she has capacity to make this decision.  Additionally, patient is already on blood pressure medication, Eliquis, atorvastatin, and medications for her diabetes so appears to be medically optimized for stroke prevention.  Will have her follow-up with her primary doctor in several days regarding her symptoms.  This patient presents to the ED for concern of complaints listed in HPI, this involves an extensive number of treatment options, and is a complaint that carries with it a high risk of complications and morbidity. Disposition including potential need for admission considered.   Dispo: DC Home. Return precautions discussed including, but not limited to, those listed in the AVS. Allowed pt time to ask questions which were answered fully prior to dc.  Additional history obtained from family Records reviewed Outpatient Clinic Notes I independently reviewed the following imaging with scope of interpretation limited to determining  acute life  threatening conditions related to emergency care: Extremity x-ray(s) and agree with the radiologist interpretation with the following exceptions: none I have reviewed the patients home medications and made adjustments as needed Social Determinants of health:  Elderly         Final Clinical Impression(s) / ED Diagnoses Final diagnoses:  Pain of left hip    Rx / DC Orders ED Discharge Orders     None         Rondel Baton, MD 05/30/23 2039

## 2023-05-31 DIAGNOSIS — E109 Type 1 diabetes mellitus without complications: Secondary | ICD-10-CM | POA: Diagnosis not present

## 2023-05-31 DIAGNOSIS — Z794 Long term (current) use of insulin: Secondary | ICD-10-CM | POA: Diagnosis not present

## 2023-06-01 DIAGNOSIS — I5032 Chronic diastolic (congestive) heart failure: Secondary | ICD-10-CM | POA: Diagnosis not present

## 2023-06-01 DIAGNOSIS — Z794 Long term (current) use of insulin: Secondary | ICD-10-CM | POA: Diagnosis not present

## 2023-06-01 DIAGNOSIS — I69354 Hemiplegia and hemiparesis following cerebral infarction affecting left non-dominant side: Secondary | ICD-10-CM | POA: Diagnosis not present

## 2023-06-01 DIAGNOSIS — E039 Hypothyroidism, unspecified: Secondary | ICD-10-CM | POA: Diagnosis not present

## 2023-06-01 DIAGNOSIS — I110 Hypertensive heart disease with heart failure: Secondary | ICD-10-CM | POA: Diagnosis not present

## 2023-06-01 DIAGNOSIS — I480 Paroxysmal atrial fibrillation: Secondary | ICD-10-CM | POA: Diagnosis not present

## 2023-06-01 DIAGNOSIS — E119 Type 2 diabetes mellitus without complications: Secondary | ICD-10-CM | POA: Diagnosis not present

## 2023-06-05 DIAGNOSIS — Z9641 Presence of insulin pump (external) (internal): Secondary | ICD-10-CM | POA: Diagnosis not present

## 2023-06-05 DIAGNOSIS — J449 Chronic obstructive pulmonary disease, unspecified: Secondary | ICD-10-CM | POA: Diagnosis not present

## 2023-06-05 DIAGNOSIS — I130 Hypertensive heart and chronic kidney disease with heart failure and stage 1 through stage 4 chronic kidney disease, or unspecified chronic kidney disease: Secondary | ICD-10-CM | POA: Diagnosis not present

## 2023-06-05 DIAGNOSIS — I5032 Chronic diastolic (congestive) heart failure: Secondary | ICD-10-CM | POA: Diagnosis not present

## 2023-06-05 DIAGNOSIS — E109 Type 1 diabetes mellitus without complications: Secondary | ICD-10-CM | POA: Diagnosis not present

## 2023-06-05 DIAGNOSIS — E890 Postprocedural hypothyroidism: Secondary | ICD-10-CM | POA: Diagnosis not present

## 2023-06-05 DIAGNOSIS — L97909 Non-pressure chronic ulcer of unspecified part of unspecified lower leg with unspecified severity: Secondary | ICD-10-CM | POA: Diagnosis not present

## 2023-06-05 DIAGNOSIS — Z794 Long term (current) use of insulin: Secondary | ICD-10-CM | POA: Diagnosis not present

## 2023-06-05 DIAGNOSIS — E1322 Other specified diabetes mellitus with diabetic chronic kidney disease: Secondary | ICD-10-CM | POA: Diagnosis not present

## 2023-06-05 DIAGNOSIS — E11319 Type 2 diabetes mellitus with unspecified diabetic retinopathy without macular edema: Secondary | ICD-10-CM | POA: Diagnosis not present

## 2023-06-05 DIAGNOSIS — C73 Malignant neoplasm of thyroid gland: Secondary | ICD-10-CM | POA: Diagnosis not present

## 2023-06-05 DIAGNOSIS — I10 Essential (primary) hypertension: Secondary | ICD-10-CM | POA: Diagnosis not present

## 2023-06-05 DIAGNOSIS — E1365 Other specified diabetes mellitus with hyperglycemia: Secondary | ICD-10-CM | POA: Diagnosis not present

## 2023-06-05 DIAGNOSIS — I639 Cerebral infarction, unspecified: Secondary | ICD-10-CM | POA: Diagnosis not present

## 2023-06-05 DIAGNOSIS — E7800 Pure hypercholesterolemia, unspecified: Secondary | ICD-10-CM | POA: Diagnosis not present

## 2023-06-05 DIAGNOSIS — I69354 Hemiplegia and hemiparesis following cerebral infarction affecting left non-dominant side: Secondary | ICD-10-CM | POA: Diagnosis not present

## 2023-06-07 DIAGNOSIS — L97419 Non-pressure chronic ulcer of right heel and midfoot with unspecified severity: Secondary | ICD-10-CM | POA: Diagnosis not present

## 2023-06-07 DIAGNOSIS — L97819 Non-pressure chronic ulcer of other part of right lower leg with unspecified severity: Secondary | ICD-10-CM | POA: Diagnosis not present

## 2023-06-07 DIAGNOSIS — I87311 Chronic venous hypertension (idiopathic) with ulcer of right lower extremity: Secondary | ICD-10-CM | POA: Diagnosis not present

## 2023-06-07 DIAGNOSIS — L97519 Non-pressure chronic ulcer of other part of right foot with unspecified severity: Secondary | ICD-10-CM | POA: Diagnosis not present

## 2023-06-08 DIAGNOSIS — I5032 Chronic diastolic (congestive) heart failure: Secondary | ICD-10-CM | POA: Diagnosis not present

## 2023-06-08 DIAGNOSIS — I69354 Hemiplegia and hemiparesis following cerebral infarction affecting left non-dominant side: Secondary | ICD-10-CM | POA: Diagnosis not present

## 2023-06-08 DIAGNOSIS — J449 Chronic obstructive pulmonary disease, unspecified: Secondary | ICD-10-CM | POA: Diagnosis not present

## 2023-06-08 DIAGNOSIS — E1322 Other specified diabetes mellitus with diabetic chronic kidney disease: Secondary | ICD-10-CM | POA: Diagnosis not present

## 2023-06-08 DIAGNOSIS — E1365 Other specified diabetes mellitus with hyperglycemia: Secondary | ICD-10-CM | POA: Diagnosis not present

## 2023-06-08 DIAGNOSIS — I130 Hypertensive heart and chronic kidney disease with heart failure and stage 1 through stage 4 chronic kidney disease, or unspecified chronic kidney disease: Secondary | ICD-10-CM | POA: Diagnosis not present

## 2023-06-12 DIAGNOSIS — I69354 Hemiplegia and hemiparesis following cerebral infarction affecting left non-dominant side: Secondary | ICD-10-CM | POA: Diagnosis not present

## 2023-06-12 DIAGNOSIS — E1365 Other specified diabetes mellitus with hyperglycemia: Secondary | ICD-10-CM | POA: Diagnosis not present

## 2023-06-12 DIAGNOSIS — I5032 Chronic diastolic (congestive) heart failure: Secondary | ICD-10-CM | POA: Diagnosis not present

## 2023-06-12 DIAGNOSIS — E1322 Other specified diabetes mellitus with diabetic chronic kidney disease: Secondary | ICD-10-CM | POA: Diagnosis not present

## 2023-06-12 DIAGNOSIS — I130 Hypertensive heart and chronic kidney disease with heart failure and stage 1 through stage 4 chronic kidney disease, or unspecified chronic kidney disease: Secondary | ICD-10-CM | POA: Diagnosis not present

## 2023-06-12 DIAGNOSIS — J449 Chronic obstructive pulmonary disease, unspecified: Secondary | ICD-10-CM | POA: Diagnosis not present

## 2023-06-13 DIAGNOSIS — E1065 Type 1 diabetes mellitus with hyperglycemia: Secondary | ICD-10-CM | POA: Diagnosis not present

## 2023-06-14 DIAGNOSIS — I130 Hypertensive heart and chronic kidney disease with heart failure and stage 1 through stage 4 chronic kidney disease, or unspecified chronic kidney disease: Secondary | ICD-10-CM | POA: Diagnosis not present

## 2023-06-14 DIAGNOSIS — I5032 Chronic diastolic (congestive) heart failure: Secondary | ICD-10-CM | POA: Diagnosis not present

## 2023-06-14 DIAGNOSIS — J449 Chronic obstructive pulmonary disease, unspecified: Secondary | ICD-10-CM | POA: Diagnosis not present

## 2023-06-14 DIAGNOSIS — E1322 Other specified diabetes mellitus with diabetic chronic kidney disease: Secondary | ICD-10-CM | POA: Diagnosis not present

## 2023-06-14 DIAGNOSIS — E1365 Other specified diabetes mellitus with hyperglycemia: Secondary | ICD-10-CM | POA: Diagnosis not present

## 2023-06-14 DIAGNOSIS — I69354 Hemiplegia and hemiparesis following cerebral infarction affecting left non-dominant side: Secondary | ICD-10-CM | POA: Diagnosis not present

## 2023-06-15 DIAGNOSIS — I130 Hypertensive heart and chronic kidney disease with heart failure and stage 1 through stage 4 chronic kidney disease, or unspecified chronic kidney disease: Secondary | ICD-10-CM | POA: Diagnosis not present

## 2023-06-15 DIAGNOSIS — E1322 Other specified diabetes mellitus with diabetic chronic kidney disease: Secondary | ICD-10-CM | POA: Diagnosis not present

## 2023-06-15 DIAGNOSIS — I69354 Hemiplegia and hemiparesis following cerebral infarction affecting left non-dominant side: Secondary | ICD-10-CM | POA: Diagnosis not present

## 2023-06-15 DIAGNOSIS — E1365 Other specified diabetes mellitus with hyperglycemia: Secondary | ICD-10-CM | POA: Diagnosis not present

## 2023-06-15 DIAGNOSIS — I5032 Chronic diastolic (congestive) heart failure: Secondary | ICD-10-CM | POA: Diagnosis not present

## 2023-06-15 DIAGNOSIS — J449 Chronic obstructive pulmonary disease, unspecified: Secondary | ICD-10-CM | POA: Diagnosis not present

## 2023-06-16 DIAGNOSIS — R0602 Shortness of breath: Secondary | ICD-10-CM | POA: Diagnosis not present

## 2023-06-16 DIAGNOSIS — R296 Repeated falls: Secondary | ICD-10-CM | POA: Diagnosis not present

## 2023-06-18 DIAGNOSIS — I130 Hypertensive heart and chronic kidney disease with heart failure and stage 1 through stage 4 chronic kidney disease, or unspecified chronic kidney disease: Secondary | ICD-10-CM | POA: Diagnosis not present

## 2023-06-18 DIAGNOSIS — J449 Chronic obstructive pulmonary disease, unspecified: Secondary | ICD-10-CM | POA: Diagnosis not present

## 2023-06-18 DIAGNOSIS — E1365 Other specified diabetes mellitus with hyperglycemia: Secondary | ICD-10-CM | POA: Diagnosis not present

## 2023-06-18 DIAGNOSIS — E1322 Other specified diabetes mellitus with diabetic chronic kidney disease: Secondary | ICD-10-CM | POA: Diagnosis not present

## 2023-06-18 DIAGNOSIS — I5032 Chronic diastolic (congestive) heart failure: Secondary | ICD-10-CM | POA: Diagnosis not present

## 2023-06-18 DIAGNOSIS — I69354 Hemiplegia and hemiparesis following cerebral infarction affecting left non-dominant side: Secondary | ICD-10-CM | POA: Diagnosis not present

## 2023-06-19 DIAGNOSIS — I5032 Chronic diastolic (congestive) heart failure: Secondary | ICD-10-CM | POA: Diagnosis not present

## 2023-06-19 DIAGNOSIS — J449 Chronic obstructive pulmonary disease, unspecified: Secondary | ICD-10-CM | POA: Diagnosis not present

## 2023-06-19 DIAGNOSIS — L97919 Non-pressure chronic ulcer of unspecified part of right lower leg with unspecified severity: Secondary | ICD-10-CM | POA: Diagnosis not present

## 2023-06-19 DIAGNOSIS — L97519 Non-pressure chronic ulcer of other part of right foot with unspecified severity: Secondary | ICD-10-CM | POA: Diagnosis not present

## 2023-06-19 DIAGNOSIS — E1322 Other specified diabetes mellitus with diabetic chronic kidney disease: Secondary | ICD-10-CM | POA: Diagnosis not present

## 2023-06-19 DIAGNOSIS — I69354 Hemiplegia and hemiparesis following cerebral infarction affecting left non-dominant side: Secondary | ICD-10-CM | POA: Diagnosis not present

## 2023-06-19 DIAGNOSIS — E1365 Other specified diabetes mellitus with hyperglycemia: Secondary | ICD-10-CM | POA: Diagnosis not present

## 2023-06-19 DIAGNOSIS — I130 Hypertensive heart and chronic kidney disease with heart failure and stage 1 through stage 4 chronic kidney disease, or unspecified chronic kidney disease: Secondary | ICD-10-CM | POA: Diagnosis not present

## 2023-06-19 DIAGNOSIS — I87311 Chronic venous hypertension (idiopathic) with ulcer of right lower extremity: Secondary | ICD-10-CM | POA: Diagnosis not present

## 2023-06-21 DIAGNOSIS — I5032 Chronic diastolic (congestive) heart failure: Secondary | ICD-10-CM | POA: Diagnosis not present

## 2023-06-21 DIAGNOSIS — I69354 Hemiplegia and hemiparesis following cerebral infarction affecting left non-dominant side: Secondary | ICD-10-CM | POA: Diagnosis not present

## 2023-06-21 DIAGNOSIS — I130 Hypertensive heart and chronic kidney disease with heart failure and stage 1 through stage 4 chronic kidney disease, or unspecified chronic kidney disease: Secondary | ICD-10-CM | POA: Diagnosis not present

## 2023-06-21 DIAGNOSIS — J449 Chronic obstructive pulmonary disease, unspecified: Secondary | ICD-10-CM | POA: Diagnosis not present

## 2023-06-21 DIAGNOSIS — E1322 Other specified diabetes mellitus with diabetic chronic kidney disease: Secondary | ICD-10-CM | POA: Diagnosis not present

## 2023-06-21 DIAGNOSIS — E1365 Other specified diabetes mellitus with hyperglycemia: Secondary | ICD-10-CM | POA: Diagnosis not present

## 2023-06-22 DIAGNOSIS — I69354 Hemiplegia and hemiparesis following cerebral infarction affecting left non-dominant side: Secondary | ICD-10-CM | POA: Diagnosis not present

## 2023-06-22 DIAGNOSIS — J449 Chronic obstructive pulmonary disease, unspecified: Secondary | ICD-10-CM | POA: Diagnosis not present

## 2023-06-22 DIAGNOSIS — I130 Hypertensive heart and chronic kidney disease with heart failure and stage 1 through stage 4 chronic kidney disease, or unspecified chronic kidney disease: Secondary | ICD-10-CM | POA: Diagnosis not present

## 2023-06-22 DIAGNOSIS — E1322 Other specified diabetes mellitus with diabetic chronic kidney disease: Secondary | ICD-10-CM | POA: Diagnosis not present

## 2023-06-22 DIAGNOSIS — R296 Repeated falls: Secondary | ICD-10-CM | POA: Diagnosis not present

## 2023-06-22 DIAGNOSIS — I5032 Chronic diastolic (congestive) heart failure: Secondary | ICD-10-CM | POA: Diagnosis not present

## 2023-06-22 DIAGNOSIS — E1365 Other specified diabetes mellitus with hyperglycemia: Secondary | ICD-10-CM | POA: Diagnosis not present

## 2023-06-22 DIAGNOSIS — R0602 Shortness of breath: Secondary | ICD-10-CM | POA: Diagnosis not present

## 2023-06-26 DIAGNOSIS — I130 Hypertensive heart and chronic kidney disease with heart failure and stage 1 through stage 4 chronic kidney disease, or unspecified chronic kidney disease: Secondary | ICD-10-CM | POA: Diagnosis not present

## 2023-06-26 DIAGNOSIS — I5032 Chronic diastolic (congestive) heart failure: Secondary | ICD-10-CM | POA: Diagnosis not present

## 2023-06-26 DIAGNOSIS — J449 Chronic obstructive pulmonary disease, unspecified: Secondary | ICD-10-CM | POA: Diagnosis not present

## 2023-06-26 DIAGNOSIS — E1322 Other specified diabetes mellitus with diabetic chronic kidney disease: Secondary | ICD-10-CM | POA: Diagnosis not present

## 2023-06-26 DIAGNOSIS — I69354 Hemiplegia and hemiparesis following cerebral infarction affecting left non-dominant side: Secondary | ICD-10-CM | POA: Diagnosis not present

## 2023-06-26 DIAGNOSIS — E1365 Other specified diabetes mellitus with hyperglycemia: Secondary | ICD-10-CM | POA: Diagnosis not present

## 2023-06-29 DIAGNOSIS — I130 Hypertensive heart and chronic kidney disease with heart failure and stage 1 through stage 4 chronic kidney disease, or unspecified chronic kidney disease: Secondary | ICD-10-CM | POA: Diagnosis not present

## 2023-06-29 DIAGNOSIS — E1322 Other specified diabetes mellitus with diabetic chronic kidney disease: Secondary | ICD-10-CM | POA: Diagnosis not present

## 2023-06-29 DIAGNOSIS — I5032 Chronic diastolic (congestive) heart failure: Secondary | ICD-10-CM | POA: Diagnosis not present

## 2023-06-29 DIAGNOSIS — I69354 Hemiplegia and hemiparesis following cerebral infarction affecting left non-dominant side: Secondary | ICD-10-CM | POA: Diagnosis not present

## 2023-06-29 DIAGNOSIS — E1365 Other specified diabetes mellitus with hyperglycemia: Secondary | ICD-10-CM | POA: Diagnosis not present

## 2023-06-29 DIAGNOSIS — J449 Chronic obstructive pulmonary disease, unspecified: Secondary | ICD-10-CM | POA: Diagnosis not present

## 2023-07-03 DIAGNOSIS — I130 Hypertensive heart and chronic kidney disease with heart failure and stage 1 through stage 4 chronic kidney disease, or unspecified chronic kidney disease: Secondary | ICD-10-CM | POA: Diagnosis not present

## 2023-07-03 DIAGNOSIS — E1365 Other specified diabetes mellitus with hyperglycemia: Secondary | ICD-10-CM | POA: Diagnosis not present

## 2023-07-03 DIAGNOSIS — I69354 Hemiplegia and hemiparesis following cerebral infarction affecting left non-dominant side: Secondary | ICD-10-CM | POA: Diagnosis not present

## 2023-07-03 DIAGNOSIS — J449 Chronic obstructive pulmonary disease, unspecified: Secondary | ICD-10-CM | POA: Diagnosis not present

## 2023-07-03 DIAGNOSIS — I5032 Chronic diastolic (congestive) heart failure: Secondary | ICD-10-CM | POA: Diagnosis not present

## 2023-07-03 DIAGNOSIS — E1322 Other specified diabetes mellitus with diabetic chronic kidney disease: Secondary | ICD-10-CM | POA: Diagnosis not present

## 2023-07-04 DIAGNOSIS — I87311 Chronic venous hypertension (idiopathic) with ulcer of right lower extremity: Secondary | ICD-10-CM | POA: Diagnosis not present

## 2023-07-05 ENCOUNTER — Ambulatory Visit (INDEPENDENT_AMBULATORY_CARE_PROVIDER_SITE_OTHER): Payer: Medicare Other

## 2023-07-05 DIAGNOSIS — I69354 Hemiplegia and hemiparesis following cerebral infarction affecting left non-dominant side: Secondary | ICD-10-CM | POA: Diagnosis not present

## 2023-07-05 DIAGNOSIS — E1322 Other specified diabetes mellitus with diabetic chronic kidney disease: Secondary | ICD-10-CM | POA: Diagnosis not present

## 2023-07-05 DIAGNOSIS — I442 Atrioventricular block, complete: Secondary | ICD-10-CM

## 2023-07-05 DIAGNOSIS — I130 Hypertensive heart and chronic kidney disease with heart failure and stage 1 through stage 4 chronic kidney disease, or unspecified chronic kidney disease: Secondary | ICD-10-CM | POA: Diagnosis not present

## 2023-07-05 DIAGNOSIS — E1365 Other specified diabetes mellitus with hyperglycemia: Secondary | ICD-10-CM | POA: Diagnosis not present

## 2023-07-05 DIAGNOSIS — J449 Chronic obstructive pulmonary disease, unspecified: Secondary | ICD-10-CM | POA: Diagnosis not present

## 2023-07-05 DIAGNOSIS — I5032 Chronic diastolic (congestive) heart failure: Secondary | ICD-10-CM | POA: Diagnosis not present

## 2023-07-05 LAB — CUP PACEART REMOTE DEVICE CHECK
Battery Remaining Longevity: 105 mo
Battery Voltage: 3 V
Brady Statistic AP VP Percent: 41.25 %
Brady Statistic AP VS Percent: 0.12 %
Brady Statistic AS VP Percent: 58.23 %
Brady Statistic AS VS Percent: 0.36 %
Brady Statistic RA Percent Paced: 33.9 %
Brady Statistic RV Percent Paced: 98.85 %
Date Time Interrogation Session: 20240710213501
Implantable Lead Connection Status: 753985
Implantable Lead Connection Status: 753985
Implantable Lead Implant Date: 20210702
Implantable Lead Implant Date: 20210702
Implantable Lead Location: 753859
Implantable Lead Location: 753860
Implantable Lead Model: 5076
Implantable Lead Model: 5076
Implantable Pulse Generator Implant Date: 20210702
Lead Channel Impedance Value: 285 Ohm
Lead Channel Impedance Value: 380 Ohm
Lead Channel Impedance Value: 399 Ohm
Lead Channel Impedance Value: 437 Ohm
Lead Channel Pacing Threshold Amplitude: 0.75 V
Lead Channel Pacing Threshold Amplitude: 0.75 V
Lead Channel Pacing Threshold Pulse Width: 0.4 ms
Lead Channel Pacing Threshold Pulse Width: 0.4 ms
Lead Channel Sensing Intrinsic Amplitude: 4.5 mV
Lead Channel Sensing Intrinsic Amplitude: 4.5 mV
Lead Channel Sensing Intrinsic Amplitude: 7.625 mV
Lead Channel Sensing Intrinsic Amplitude: 7.625 mV
Lead Channel Setting Pacing Amplitude: 1.75 V
Lead Channel Setting Pacing Amplitude: 2.5 V
Lead Channel Setting Pacing Pulse Width: 0.4 ms
Lead Channel Setting Sensing Sensitivity: 1.2 mV
Zone Setting Status: 755011
Zone Setting Status: 755011

## 2023-07-07 DIAGNOSIS — I5032 Chronic diastolic (congestive) heart failure: Secondary | ICD-10-CM | POA: Diagnosis not present

## 2023-07-07 DIAGNOSIS — J9611 Chronic respiratory failure with hypoxia: Secondary | ICD-10-CM | POA: Diagnosis not present

## 2023-07-07 DIAGNOSIS — E1365 Other specified diabetes mellitus with hyperglycemia: Secondary | ICD-10-CM | POA: Diagnosis not present

## 2023-07-07 DIAGNOSIS — I69354 Hemiplegia and hemiparesis following cerebral infarction affecting left non-dominant side: Secondary | ICD-10-CM | POA: Diagnosis not present

## 2023-07-07 DIAGNOSIS — I130 Hypertensive heart and chronic kidney disease with heart failure and stage 1 through stage 4 chronic kidney disease, or unspecified chronic kidney disease: Secondary | ICD-10-CM | POA: Diagnosis not present

## 2023-07-07 DIAGNOSIS — E1322 Other specified diabetes mellitus with diabetic chronic kidney disease: Secondary | ICD-10-CM | POA: Diagnosis not present

## 2023-07-07 DIAGNOSIS — E039 Hypothyroidism, unspecified: Secondary | ICD-10-CM | POA: Diagnosis not present

## 2023-07-07 DIAGNOSIS — I272 Pulmonary hypertension, unspecified: Secondary | ICD-10-CM | POA: Diagnosis not present

## 2023-07-07 DIAGNOSIS — D631 Anemia in chronic kidney disease: Secondary | ICD-10-CM | POA: Diagnosis not present

## 2023-07-07 DIAGNOSIS — I480 Paroxysmal atrial fibrillation: Secondary | ICD-10-CM | POA: Diagnosis not present

## 2023-07-07 DIAGNOSIS — N189 Chronic kidney disease, unspecified: Secondary | ICD-10-CM | POA: Diagnosis not present

## 2023-07-07 DIAGNOSIS — J449 Chronic obstructive pulmonary disease, unspecified: Secondary | ICD-10-CM | POA: Diagnosis not present

## 2023-07-10 DIAGNOSIS — J449 Chronic obstructive pulmonary disease, unspecified: Secondary | ICD-10-CM | POA: Diagnosis not present

## 2023-07-10 DIAGNOSIS — I69354 Hemiplegia and hemiparesis following cerebral infarction affecting left non-dominant side: Secondary | ICD-10-CM | POA: Diagnosis not present

## 2023-07-10 DIAGNOSIS — I87311 Chronic venous hypertension (idiopathic) with ulcer of right lower extremity: Secondary | ICD-10-CM | POA: Diagnosis not present

## 2023-07-10 DIAGNOSIS — I130 Hypertensive heart and chronic kidney disease with heart failure and stage 1 through stage 4 chronic kidney disease, or unspecified chronic kidney disease: Secondary | ICD-10-CM | POA: Diagnosis not present

## 2023-07-10 DIAGNOSIS — S91301A Unspecified open wound, right foot, initial encounter: Secondary | ICD-10-CM | POA: Diagnosis not present

## 2023-07-10 DIAGNOSIS — I5032 Chronic diastolic (congestive) heart failure: Secondary | ICD-10-CM | POA: Diagnosis not present

## 2023-07-10 DIAGNOSIS — E1322 Other specified diabetes mellitus with diabetic chronic kidney disease: Secondary | ICD-10-CM | POA: Diagnosis not present

## 2023-07-10 DIAGNOSIS — E1365 Other specified diabetes mellitus with hyperglycemia: Secondary | ICD-10-CM | POA: Diagnosis not present

## 2023-07-12 DIAGNOSIS — J449 Chronic obstructive pulmonary disease, unspecified: Secondary | ICD-10-CM | POA: Diagnosis not present

## 2023-07-12 DIAGNOSIS — I69354 Hemiplegia and hemiparesis following cerebral infarction affecting left non-dominant side: Secondary | ICD-10-CM | POA: Diagnosis not present

## 2023-07-12 DIAGNOSIS — I130 Hypertensive heart and chronic kidney disease with heart failure and stage 1 through stage 4 chronic kidney disease, or unspecified chronic kidney disease: Secondary | ICD-10-CM | POA: Diagnosis not present

## 2023-07-12 DIAGNOSIS — I5032 Chronic diastolic (congestive) heart failure: Secondary | ICD-10-CM | POA: Diagnosis not present

## 2023-07-12 DIAGNOSIS — E1365 Other specified diabetes mellitus with hyperglycemia: Secondary | ICD-10-CM | POA: Diagnosis not present

## 2023-07-12 DIAGNOSIS — E1322 Other specified diabetes mellitus with diabetic chronic kidney disease: Secondary | ICD-10-CM | POA: Diagnosis not present

## 2023-07-13 DIAGNOSIS — I5032 Chronic diastolic (congestive) heart failure: Secondary | ICD-10-CM | POA: Diagnosis not present

## 2023-07-13 DIAGNOSIS — I130 Hypertensive heart and chronic kidney disease with heart failure and stage 1 through stage 4 chronic kidney disease, or unspecified chronic kidney disease: Secondary | ICD-10-CM | POA: Diagnosis not present

## 2023-07-13 DIAGNOSIS — J449 Chronic obstructive pulmonary disease, unspecified: Secondary | ICD-10-CM | POA: Diagnosis not present

## 2023-07-13 DIAGNOSIS — E1065 Type 1 diabetes mellitus with hyperglycemia: Secondary | ICD-10-CM | POA: Diagnosis not present

## 2023-07-13 DIAGNOSIS — E1365 Other specified diabetes mellitus with hyperglycemia: Secondary | ICD-10-CM | POA: Diagnosis not present

## 2023-07-13 DIAGNOSIS — I69354 Hemiplegia and hemiparesis following cerebral infarction affecting left non-dominant side: Secondary | ICD-10-CM | POA: Diagnosis not present

## 2023-07-13 DIAGNOSIS — E1322 Other specified diabetes mellitus with diabetic chronic kidney disease: Secondary | ICD-10-CM | POA: Diagnosis not present

## 2023-07-16 ENCOUNTER — Ambulatory Visit (INDEPENDENT_AMBULATORY_CARE_PROVIDER_SITE_OTHER): Payer: Medicare Other | Admitting: Podiatry

## 2023-07-16 ENCOUNTER — Encounter: Payer: Self-pay | Admitting: Podiatry

## 2023-07-16 DIAGNOSIS — R0602 Shortness of breath: Secondary | ICD-10-CM | POA: Diagnosis not present

## 2023-07-16 DIAGNOSIS — E1159 Type 2 diabetes mellitus with other circulatory complications: Secondary | ICD-10-CM | POA: Diagnosis not present

## 2023-07-16 DIAGNOSIS — M79674 Pain in right toe(s): Secondary | ICD-10-CM | POA: Diagnosis not present

## 2023-07-16 DIAGNOSIS — M79675 Pain in left toe(s): Secondary | ICD-10-CM

## 2023-07-16 DIAGNOSIS — B351 Tinea unguium: Secondary | ICD-10-CM | POA: Diagnosis not present

## 2023-07-16 DIAGNOSIS — R296 Repeated falls: Secondary | ICD-10-CM | POA: Diagnosis not present

## 2023-07-16 NOTE — Progress Notes (Signed)
This patient returns to my office for at risk foot care.  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  This patient is unable to cut nails herself  since the patient cannot reach her  nails.These nails are painful walking and wearing shoes.  Patient presents in a wheelchair  with her granddaughter..  This patient presents for at risk foot care today.  General Appearance  Alert, conversant and in no acute stress.  Vascular  Dorsalis pedis and posterior tibial  pulses are absent due to swelling. bilaterally.  Capillary return is within normal limits  bilaterally. Temperature is within normal limits  bilaterally.  Neurologic  Senn-Weinstein monofilament wire diminished  bilaterally. Muscle power within normal limits bilaterally.  Nails Thick disfigured discolored nails with subungual debris  from hallux to fifth toes bilaterally. No evidence of bacterial infection or drainage bilaterally.  Orthopedic  No limitations of motion  feet .  No crepitus or effusions noted.  No bony pathology or digital deformities noted.  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  Hallux  malleus  B/L  Skin  normotropic skin with no porokeratosis noted bilaterally.  Skin lesion on dorsum of hallux malleus  B/L.    Onychomycosis  Pain in right toes  Pain in left toes  Consent was obtained for treatment procedures.   Mechanical debridement of nails 1-5  bilaterally performed with a nail nipper.  Filed with dremel without incident.    Return office visit    3 months       Told patient to return for periodic foot care and evaluation due to potential at risk complications.   Helane Gunther DPM

## 2023-07-17 DIAGNOSIS — J449 Chronic obstructive pulmonary disease, unspecified: Secondary | ICD-10-CM | POA: Diagnosis not present

## 2023-07-17 DIAGNOSIS — E1365 Other specified diabetes mellitus with hyperglycemia: Secondary | ICD-10-CM | POA: Diagnosis not present

## 2023-07-17 DIAGNOSIS — E1322 Other specified diabetes mellitus with diabetic chronic kidney disease: Secondary | ICD-10-CM | POA: Diagnosis not present

## 2023-07-17 DIAGNOSIS — I5032 Chronic diastolic (congestive) heart failure: Secondary | ICD-10-CM | POA: Diagnosis not present

## 2023-07-17 DIAGNOSIS — I130 Hypertensive heart and chronic kidney disease with heart failure and stage 1 through stage 4 chronic kidney disease, or unspecified chronic kidney disease: Secondary | ICD-10-CM | POA: Diagnosis not present

## 2023-07-17 DIAGNOSIS — I69354 Hemiplegia and hemiparesis following cerebral infarction affecting left non-dominant side: Secondary | ICD-10-CM | POA: Diagnosis not present

## 2023-07-20 DIAGNOSIS — J449 Chronic obstructive pulmonary disease, unspecified: Secondary | ICD-10-CM | POA: Diagnosis not present

## 2023-07-20 DIAGNOSIS — I69354 Hemiplegia and hemiparesis following cerebral infarction affecting left non-dominant side: Secondary | ICD-10-CM | POA: Diagnosis not present

## 2023-07-20 DIAGNOSIS — I130 Hypertensive heart and chronic kidney disease with heart failure and stage 1 through stage 4 chronic kidney disease, or unspecified chronic kidney disease: Secondary | ICD-10-CM | POA: Diagnosis not present

## 2023-07-20 DIAGNOSIS — I5032 Chronic diastolic (congestive) heart failure: Secondary | ICD-10-CM | POA: Diagnosis not present

## 2023-07-20 DIAGNOSIS — E1322 Other specified diabetes mellitus with diabetic chronic kidney disease: Secondary | ICD-10-CM | POA: Diagnosis not present

## 2023-07-20 DIAGNOSIS — E1365 Other specified diabetes mellitus with hyperglycemia: Secondary | ICD-10-CM | POA: Diagnosis not present

## 2023-07-22 DIAGNOSIS — R0602 Shortness of breath: Secondary | ICD-10-CM | POA: Diagnosis not present

## 2023-07-22 DIAGNOSIS — R296 Repeated falls: Secondary | ICD-10-CM | POA: Diagnosis not present

## 2023-07-23 DIAGNOSIS — N1831 Chronic kidney disease, stage 3a: Secondary | ICD-10-CM | POA: Diagnosis not present

## 2023-07-23 DIAGNOSIS — J449 Chronic obstructive pulmonary disease, unspecified: Secondary | ICD-10-CM | POA: Diagnosis not present

## 2023-07-23 DIAGNOSIS — I442 Atrioventricular block, complete: Secondary | ICD-10-CM | POA: Diagnosis not present

## 2023-07-23 DIAGNOSIS — E1122 Type 2 diabetes mellitus with diabetic chronic kidney disease: Secondary | ICD-10-CM | POA: Diagnosis not present

## 2023-07-23 DIAGNOSIS — I480 Paroxysmal atrial fibrillation: Secondary | ICD-10-CM | POA: Diagnosis not present

## 2023-07-23 DIAGNOSIS — Z794 Long term (current) use of insulin: Secondary | ICD-10-CM | POA: Diagnosis not present

## 2023-07-23 DIAGNOSIS — R945 Abnormal results of liver function studies: Secondary | ICD-10-CM | POA: Diagnosis not present

## 2023-07-23 DIAGNOSIS — D649 Anemia, unspecified: Secondary | ICD-10-CM | POA: Diagnosis not present

## 2023-07-23 DIAGNOSIS — J9611 Chronic respiratory failure with hypoxia: Secondary | ICD-10-CM | POA: Diagnosis not present

## 2023-07-23 DIAGNOSIS — I272 Pulmonary hypertension, unspecified: Secondary | ICD-10-CM | POA: Diagnosis not present

## 2023-07-23 DIAGNOSIS — I5032 Chronic diastolic (congestive) heart failure: Secondary | ICD-10-CM | POA: Diagnosis not present

## 2023-07-24 DIAGNOSIS — E1365 Other specified diabetes mellitus with hyperglycemia: Secondary | ICD-10-CM | POA: Diagnosis not present

## 2023-07-24 DIAGNOSIS — J449 Chronic obstructive pulmonary disease, unspecified: Secondary | ICD-10-CM | POA: Diagnosis not present

## 2023-07-24 DIAGNOSIS — I69354 Hemiplegia and hemiparesis following cerebral infarction affecting left non-dominant side: Secondary | ICD-10-CM | POA: Diagnosis not present

## 2023-07-24 DIAGNOSIS — E1322 Other specified diabetes mellitus with diabetic chronic kidney disease: Secondary | ICD-10-CM | POA: Diagnosis not present

## 2023-07-24 DIAGNOSIS — I130 Hypertensive heart and chronic kidney disease with heart failure and stage 1 through stage 4 chronic kidney disease, or unspecified chronic kidney disease: Secondary | ICD-10-CM | POA: Diagnosis not present

## 2023-07-24 DIAGNOSIS — I5032 Chronic diastolic (congestive) heart failure: Secondary | ICD-10-CM | POA: Diagnosis not present

## 2023-07-24 NOTE — Progress Notes (Signed)
Remote pacemaker transmission.   

## 2023-07-27 DIAGNOSIS — I5032 Chronic diastolic (congestive) heart failure: Secondary | ICD-10-CM | POA: Diagnosis not present

## 2023-07-27 DIAGNOSIS — I130 Hypertensive heart and chronic kidney disease with heart failure and stage 1 through stage 4 chronic kidney disease, or unspecified chronic kidney disease: Secondary | ICD-10-CM | POA: Diagnosis not present

## 2023-07-27 DIAGNOSIS — J449 Chronic obstructive pulmonary disease, unspecified: Secondary | ICD-10-CM | POA: Diagnosis not present

## 2023-07-27 DIAGNOSIS — E1322 Other specified diabetes mellitus with diabetic chronic kidney disease: Secondary | ICD-10-CM | POA: Diagnosis not present

## 2023-07-27 DIAGNOSIS — I69354 Hemiplegia and hemiparesis following cerebral infarction affecting left non-dominant side: Secondary | ICD-10-CM | POA: Diagnosis not present

## 2023-07-27 DIAGNOSIS — E1365 Other specified diabetes mellitus with hyperglycemia: Secondary | ICD-10-CM | POA: Diagnosis not present

## 2023-07-31 DIAGNOSIS — J449 Chronic obstructive pulmonary disease, unspecified: Secondary | ICD-10-CM | POA: Diagnosis not present

## 2023-07-31 DIAGNOSIS — I69354 Hemiplegia and hemiparesis following cerebral infarction affecting left non-dominant side: Secondary | ICD-10-CM | POA: Diagnosis not present

## 2023-07-31 DIAGNOSIS — I5032 Chronic diastolic (congestive) heart failure: Secondary | ICD-10-CM | POA: Diagnosis not present

## 2023-07-31 DIAGNOSIS — E1365 Other specified diabetes mellitus with hyperglycemia: Secondary | ICD-10-CM | POA: Diagnosis not present

## 2023-07-31 DIAGNOSIS — E1322 Other specified diabetes mellitus with diabetic chronic kidney disease: Secondary | ICD-10-CM | POA: Diagnosis not present

## 2023-07-31 DIAGNOSIS — I130 Hypertensive heart and chronic kidney disease with heart failure and stage 1 through stage 4 chronic kidney disease, or unspecified chronic kidney disease: Secondary | ICD-10-CM | POA: Diagnosis not present

## 2023-08-02 ENCOUNTER — Other Ambulatory Visit: Payer: Self-pay | Admitting: Pulmonary Disease

## 2023-08-03 DIAGNOSIS — I69354 Hemiplegia and hemiparesis following cerebral infarction affecting left non-dominant side: Secondary | ICD-10-CM | POA: Diagnosis not present

## 2023-08-03 DIAGNOSIS — E1322 Other specified diabetes mellitus with diabetic chronic kidney disease: Secondary | ICD-10-CM | POA: Diagnosis not present

## 2023-08-03 DIAGNOSIS — J449 Chronic obstructive pulmonary disease, unspecified: Secondary | ICD-10-CM | POA: Diagnosis not present

## 2023-08-03 DIAGNOSIS — I5032 Chronic diastolic (congestive) heart failure: Secondary | ICD-10-CM | POA: Diagnosis not present

## 2023-08-03 DIAGNOSIS — E1365 Other specified diabetes mellitus with hyperglycemia: Secondary | ICD-10-CM | POA: Diagnosis not present

## 2023-08-03 DIAGNOSIS — I130 Hypertensive heart and chronic kidney disease with heart failure and stage 1 through stage 4 chronic kidney disease, or unspecified chronic kidney disease: Secondary | ICD-10-CM | POA: Diagnosis not present

## 2023-08-05 DIAGNOSIS — I87311 Chronic venous hypertension (idiopathic) with ulcer of right lower extremity: Secondary | ICD-10-CM | POA: Diagnosis not present

## 2023-08-05 DIAGNOSIS — L97419 Non-pressure chronic ulcer of right heel and midfoot with unspecified severity: Secondary | ICD-10-CM | POA: Diagnosis not present

## 2023-08-07 ENCOUNTER — Encounter: Payer: Self-pay | Admitting: Cardiology

## 2023-08-07 ENCOUNTER — Ambulatory Visit: Payer: Medicare Other | Admitting: Cardiology

## 2023-08-07 VITALS — BP 130/60 | HR 47 | Resp 16 | Ht 67.0 in | Wt 240.0 lb

## 2023-08-07 DIAGNOSIS — I442 Atrioventricular block, complete: Secondary | ICD-10-CM | POA: Diagnosis not present

## 2023-08-07 DIAGNOSIS — Z950 Presence of cardiac pacemaker: Secondary | ICD-10-CM

## 2023-08-07 DIAGNOSIS — I10 Essential (primary) hypertension: Secondary | ICD-10-CM

## 2023-08-07 DIAGNOSIS — I480 Paroxysmal atrial fibrillation: Secondary | ICD-10-CM

## 2023-08-07 DIAGNOSIS — I5032 Chronic diastolic (congestive) heart failure: Secondary | ICD-10-CM | POA: Diagnosis not present

## 2023-08-07 NOTE — Progress Notes (Signed)
Primary Physician/Referring:  Irven Coe, MD  Patient ID: [name], female    DOB: [birth year], 72 y.o.   MRN: [id number]  Chief Complaint  Patient presents with   Chronic diastolic (congestive) heart failure   Hypertension   Follow-up   HPI:    [name]  is a 72 y.o. 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  She has no specific complaints today.  Past Medical History:  Diagnosis Date   Abnormal echocardiogram    Anemia    Arthritis    BPPV (benign paroxysmal positional vertigo), right 06/13/2017   Bradycardia 03/07/2020   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   CHB (complete heart block) (HCC) 03/08/2020   CHF (congestive heart failure) (HCC)    Closed fracture of left proximal humerus 07/08/2019   Decreased range of motion of left shoulder 09/03/2020   Depression    Dizzy 03/07/2020   bradycardia   Edentulous 09/03/2020   GERD (gastroesophageal reflux disease)    Hemiparesis affecting left side as late effect of cerebrovascular accident (CVA) (HCC) 03/08/2020   Hemorrhoids    History of Falls with injury 03/08/2020   Fall resulting in left humeral fracture   Hyperlipidemia    Hypertension    Hypothyroidism    Intracranial vascular stenosis    Lacunar infarction (HCC)    Brain MRI 07/2021 multiple small remote pontine lacunar infarcts   Near syncope 03/08/2020   Paroxysmal atrial fibrillation (HCC)    Presence of permanent cardiac pacemaker    Proteinuria 03/08/2020   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 06/25/20 06/26/2020   Seasonal allergies    Stroke (HCC)    Thyroid cancer (HCC)    per pt S/p Total  Thyroidectomy with Radioactive Iodine Therapy   Type 1 diabetes mellitus (HCC)    Past Surgical History:  Procedure Laterality Date   ABDOMINAL ANGIOGRAM N/A 05/14/2012   Procedure: ABDOMINAL ANGIOGRAM;  Surgeon: [name], MD;  Location: [facility] CATH LAB;  Service: Cardiovascular;  Laterality: N/A;   ABDOMINAL HYSTERECTOMY  1995   partial   ANKLE FRACTURE SURGERY Right    CARDIAC CATHETERIZATION     with coronary angiogram   cataract  Bilateral 2018   COLONOSCOPY N/A 09/09/2014   Procedure: COLONOSCOPY;  Surgeon: Iva Boop, MD;  Location: [facility] ENDOSCOPY;  Service: Endoscopy;  Laterality: N/A;   ESOPHAGOGASTRODUODENOSCOPY (EGD) WITH PROPOFOL N/A 12/03/2018   Procedure: ESOPHAGOGASTRODUODENOSCOPY (EGD) WITH PROPOFOL;  Surgeon: Sherrilyn Rist, MD;  Location: [facility] ENDOSCOPY;  Service: Gastroenterology;  Laterality: N/A;   LEFT AND RIGHT HEART CATHETERIZATION WITH CORONARY ANGIOGRAM N/A 05/14/2012   Procedure: LEFT AND RIGHT HEART CATHETERIZATION WITH CORONARY ANGIOGRAM;  Surgeon: [name], MD;  Location: [facility] CATH LAB;  Service: Cardiovascular;  Laterality: N/A;   LOOP RECORDER INSERTION N/A 12/23/2018   Procedure: LOOP RECORDER INSERTION;  Surgeon: Regan Lemming, MD;  Location: [facility] INVASIVE CV LAB;  Service: Cardiovascular;  Laterality: N/A;   LOOP RECORDER REMOVAL N/A 06/25/2020   Procedure: LOOP RECORDER REMOVAL;  Surgeon: Regan Lemming, MD;  Location: [facility] INVASIVE CV LAB;  Service: Cardiovascular;  Laterality: N/A;   MINI METER   09/08/2014   ELECTRONIC INSULIN PUMP   PACEMAKER IMPLANT N/A 06/25/2020  Procedure: PACEMAKER IMPLANT;  Surgeon: Regan Lemming, MD;  Location: [facility] INVASIVE CV LAB;  Service: Cardiovascular;  Laterality: N/A;   TEE WITHOUT CARDIOVERSION N/A 10/16/2018   Procedure: TRANSESOPHAGEAL ECHOCARDIOGRAM (TEE);  Surgeon: Jodelle Red, MD;  Location: [facility] ENDOSCOPY;  Service: Cardiovascular;  Laterality: N/A;   THYROIDECTOMY  2006    for thyroid cancer   Family History  Problem Relation Age of Onset   Heart disease Mother    Hyperlipidemia Mother    Hypertension Mother    Renal Disease Mother        insufficiency   Heart attack Mother        in his 58's   Early death Father        Head Injury at Work   Hyperlipidemia Father    Hypertension Father    Stroke Son    Diabetes Maternal Aunt    Prostate cancer Paternal Uncle    Heart disease Maternal Aunt    Heart failure Maternal Grandfather    Heart disease Maternal Grandfather    Heart attack Maternal Grandfather        Died in his 22's   Breast cancer Neg Hx     Social History   Tobacco Use   Smoking status: Never   Smokeless tobacco: Never  Substance Use Topics   Alcohol use: No   Marital Status: Divorced  ROS  Review of Systems  Cardiovascular:  Positive for leg swelling. Negative for chest pain and dyspnea on exertion.   Objective      08/07/2023    1:51 PM 08/07/2023   12:50 PM 05/30/2023    3:45 PM  Vitals with BMI  Height  5\' 7"    Weight  240 lbs   BMI  37.58   Systolic 130 142 914  Diastolic 60 45 53  Pulse  47 50   Blood pressure 130/60, pulse (!) 47, resp. rate 16, height 5\' 7"  (1.702 m), weight 240 lb (108.9 kg), SpO2 94%.  Physical Exam Neck:     Vascular: No carotid bruit.     Comments: Short neck unable to make out JVD, physical exam limited due to patient being in a wheelchair Cardiovascular:     Rate and Rhythm: Normal rate and regular rhythm.     Pulses:          Dorsalis pedis pulses are 0 on the right side and 0 on the left side.       Posterior tibial pulses are 0 on the right side and 0 on the left side.     Heart sounds: Normal heart sounds. No murmur heard.    No gallop.  Pulmonary:     Effort: Pulmonary effort is normal.     Breath sounds: Normal breath sounds.  Abdominal:     General: Bowel sounds are normal.     Palpations: Abdomen is soft.  Musculoskeletal:     Right lower leg: Edema (2+ pitting and  large adipose tissue) present.     Left lower leg: Edema (2+ pitting and large adipose tissue) present.    Laboratory examination:   Lab Results  Component Value Date   GLUCOSE 228 (H) 05/29/2022   NA 142 05/29/2022   K 3.6 05/29/2022   CL 105 05/29/2022   CO2 29 05/29/2022   BUN 10 05/29/2022   CREATININE 1.07 (H) 05/29/2022   GFRNONAA 56 (L) 05/29/2022   CALCIUM 9.1 05/29/2022   PROT 6.8 02/11/2022   ALBUMIN 2.8 (L) 02/11/2022  LABGLOB 2.5 03/14/2019   AGRATIO 1.4 03/14/2019   BILITOT 1.2 02/11/2022   ALKPHOS 72 02/11/2022   AST 15 02/11/2022   ALT 10 02/11/2022   ANIONGAP 8 05/29/2022      Lab Results  Component Value Date   ALT 10 02/11/2022   AST 15 02/11/2022   ALKPHOS 72 02/11/2022   BILITOT 1.2 02/11/2022       Latest Ref Rng & Units 05/29/2022    1:26 PM 03/02/2022    3:48 PM 02/15/2022    3:48 AM  CBC  WBC 4.0 - 10.5 K/uL 4.7  4.2  4.8   Hemoglobin 12.0 - 15.0 g/dL 40.9  81.1  9.6   Hematocrit 36.0 - 46.0 % 35.5  33.8  30.3   Platelets 150 - 400 K/uL 203  185  202        Latest Ref Rng & Units 02/11/2022   12:32 AM 02/10/2022   12:12 PM 10/31/2021    8:33 PM  Hepatic Function  Total Protein 6.5 - 8.1 g/dL 6.8  6.9  7.1   Albumin 3.5 - 5.0 g/dL 2.8  3.0  3.0   AST 15 - 41 U/L 15  20  23    ALT 0 - 44 U/L 10  12  19    Alk Phosphatase 38 - 126 U/L 72  76  107   Total Bilirubin 0.3 - 1.2 mg/dL 1.2  1.8  1.1    HEMOGLOBIN A1C Lab Results  Component Value Date   HGBA1C 8.3 (H) 02/11/2022   MPG 191.51 02/11/2022   TSH Lab Results  Component Value Date   TSH 2.658 05/29/2022   External labs:   Labs 07/26/2023:  Hb 10.3/HCT 32.7, platelets 133, normal indicis.  Serum glucose 252, BUN 23, creatinine 1.26, EGFR 45 mL, potassium 4.3, LFTs normal.  Total cholesterol 95, triglycerides 69, HDL 55, LDL 40.  TSH normal at 2.20.  Radiology:    Cardiac Studies:   Coronary CTA 03/10/2020:  1. Severe atherosclerotic plaque in the distal RCA and  moderate CAD in the proximal and mid LAD, CADRADS = 4. CT FFR will be performed and reported separately.   2. The patient's coronary artery calcium score is 1371, which places the patient in the 99th percentile. Motion artifact from arrhythmia impacts the quantitation of coronary calcium which maybe overestimated.   3. Normal coronary origin with right dominance.  1. Left Main:  No significant stenosis. FFR = 0.95 2. LAD: No significant stenosis. Proximal FFR = 0.87, Mid FFR = 0.84, Distal FFR = 0.74 3. LCX: No significant stenosis. Proximal FFR = 0.91, Distal FFR = could not be mapped 4. RCA: No significant stenosis. Proximal FFR = 0.91, Mid FFR = 0.86, Distal FFR = 0.79. PLA FFR = 0.73   IMPRESSION: 1. CT FFR analysis demonstrates hemodynamically significant stenosis in the ostial posterolateral branch, as well as distal LAD.  Echocardiogram 03/02/2023:  1. Left ventricular ejection fraction, by estimation, is 60 to 65%. The left ventricle has normal function. The left ventricle has no regional wall motion abnormalities. There is mild concentric left ventricular hypertrophy. Left ventricular diastolic  function could not be evaluated.  2. Right ventricular systolic function is normal. The right ventricular size is normal. There is mildly elevated pulmonary artery systolic pressure.  3. The mitral valve is normal in structure. No evidence of mitral valve regurgitation. No evidence of mitral stenosis.  4. The aortic valve is tricuspid. There is mild calcification of the aortic  valve. There is mild thickening of the aortic valve. Aortic valve regurgitation is not visualized. Aortic valve sclerosis is present, with no evidence of aortic valve stenosis.  Aortic valve area, by VTI measures 1.40 cm. Aortic valve mean gradient measures 4.0 mmHg. Aortic valve Vmax measures 1.43 m/s.  5. The inferior vena cava is normal in size with greater than 50% respiratory variability, suggesting right atrial  pressure of 3 mmHg.   EKG:   EKG 08/07/2023: AV paced rhythm, no further analysis.  Compared to 11/07/2022, atrial flutter with 3: 1 conduction has now been replaced by AV paced rhythm.  Medications and allergies   Allergies  Allergen Reactions   Latex Rash    Medication list   Current Outpatient Medications:    acetaminophen (TYLENOL) 325 MG tablet, Take 2 tablets (650 mg total) by mouth every 6 (six) hours as needed for moderate pain., Disp: , Rfl:    amLODipine (NORVASC) 10 MG tablet, TAKE 1 TABLET(10 MG) BY MOUTH DAILY, Disp: 90 tablet, Rfl: 2   apixaban (ELIQUIS) 5 MG TABS tablet, Take 1 tablet (5 mg total) by mouth 2 (two) times daily., Disp: 180 tablet, Rfl: 1   atorvastatin (LIPITOR) 80 MG tablet, TAKE 1 TABLET(80 MG) BY MOUTH EVERY EVENING, Disp: 90 tablet, Rfl: 1   busPIRone (BUSPAR) 5 MG tablet, TAKE 1 TABLET(5 MG) BY MOUTH THREE TIMES DAILY, Disp: 90 tablet, Rfl: 0   cholecalciferol (VITAMIN D3) 25 MCG (1000 UNIT) tablet, Take 1,000 Units by mouth daily., Disp: , Rfl:    Cranberry 50 MG CHEW, Chew by mouth., Disp: , Rfl:    Fexofenadine HCl (ALLEGRA PO), Take 10 mg by mouth 2 (two) times daily., Disp: , Rfl:    fluticasone (FLONASE) 50 MCG/ACT nasal spray, SHAKE LIQUID AND USE 1 SPRAY IN EACH NOSTRIL IN THE MORNING AND AT BEDTIME, Disp: 16 g, Rfl: 0   furosemide (LASIX) 40 MG tablet, Take 1 tablet (40 mg total) by mouth 2 (two) times daily., Disp: 180 tablet, Rfl: 3   gabapentin (NEURONTIN) 300 MG capsule, Take 1 capsule (300 mg total) by mouth as needed., Disp: 30 capsule, Rfl: 0   HUMALOG 100 UNIT/ML injection, SMARTSIG:100 Unit(s) SUB-Q Daily, Disp: , Rfl:    Insulin Human (INSULIN PUMP) SOLN, Inject 1 each into the skin 3 times daily with meals, bedtime and 2 AM., Disp: , Rfl:    levothyroxine (SYNTHROID) 175 MCG tablet, TAKE 1 TABLET(175 MCG) BY MOUTH DAILY BEFORE BREAKFAST, Disp: 30 tablet, Rfl: 2   loratadine (CLARITIN) 10 MG tablet, Take 10 mg by mouth every evening.,  Disp: , Rfl:    losartan (COZAAR) 25 MG tablet, Take 1 tablet (25 mg total) by mouth daily., Disp: 90 tablet, Rfl: 3   metoprolol succinate (TOPROL-XL) 50 MG 24 hr tablet, Take 1 tablet (50 mg total) by mouth daily., Disp: 90 tablet, Rfl: 3   multivitamin-lutein (OCUVITE-LUTEIN) CAPS capsule, Take 1 capsule by mouth daily., Disp: , Rfl:    Omega-3 Fatty Acids (FISH OIL) 300 MG CAPS, Take by mouth., Disp: , Rfl:    pantoprazole (PROTONIX) 40 MG tablet, Take 1 tablet (40 mg total) by mouth 2 (two) times daily., Disp: 60 tablet, Rfl: 0   polyethylene glycol powder (GLYCOLAX/MIRALAX) 17 GM/SCOOP powder, Take 17 g by mouth 2 (two) times daily as needed for moderate constipation., Disp: 3350 g, Rfl: 3   potassium chloride (KLOR-CON) 10 MEQ tablet, Take 1 tablet (10 mEq total) by mouth daily., Disp: 30 tablet, Rfl: 0  traMADol (ULTRAM) 50 MG tablet, Take 1 tablet (50 mg total) by mouth every 6 (six) hours as needed., Disp: 10 tablet, Rfl: 0   Assessment     ICD-10-CM   1. Chronic diastolic (congestive) heart failure (HCC)  I50.32 EKG 12-Lead    2. Paroxysmal atrial fibrillation (HCC)  I48.0     3. Essential hypertension  I10     4. Complete AV block (HCC)  I44.2     5. 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 06/25/20  Z95.0        Orders Placed This Encounter  Procedures   EKG 12-Lead    No orders of the defined types were placed in this encounter.   Medications Discontinued During This Encounter  Medication Reason   insulin aspart (NOVOLOG) 100 UNIT/ML injection Change in therapy     Recommendations:   Tarla Fryar Chetana Scopel is a 72 y.o. 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  1. Chronic diastolic  (congestive) heart failure (HCC) Patient is presently doing well, she has chronic bilateral leg edema that is unchanged due to both morbid obesity, patient being essentially wheelchair-bound and dependent edema.  Do not suspect acute decompensated heart failure.  Fortunately she has not had any recent hospitalization.  She is certainly at risk for hospitalization in view of her comorbidity including morbid obesity, prior stroke with left hemiparesis, wheelchair-bound. - EKG 12-Lead  2. Paroxysmal atrial fibrillation (HCC) She is maintaining sinus rhythm/atrially paced and ventricularly paced rhythm.  Presently on anticoagulation with Eliquis, reviewed her labs, CBC has remained stable, renal function has remained stable at stage IIIa chronic kidney disease.  She does have mild chronic anemia and hemoglobin is around 10.0.  3. Essential hypertension Blood pressure is well-controlled.  4. Complete AV block (HCC) Patient is presently pacer dependent, pacer is being followed by EP and functioning normally.  5. 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 06/25/20 As dictated above.  As she has remained stable from cardiac standpoint, I will see her back on annual basis.  Other orders - HUMALOG 100 UNIT/ML injection; SMARTSIG:100 Unit(s) SUB-Q Daily     Yates Decamp, MD, [facility] 08/07/2023, 2:01 PM Office: [phone]

## 2023-08-09 DIAGNOSIS — I130 Hypertensive heart and chronic kidney disease with heart failure and stage 1 through stage 4 chronic kidney disease, or unspecified chronic kidney disease: Secondary | ICD-10-CM | POA: Diagnosis not present

## 2023-08-09 DIAGNOSIS — I69354 Hemiplegia and hemiparesis following cerebral infarction affecting left non-dominant side: Secondary | ICD-10-CM | POA: Diagnosis not present

## 2023-08-09 DIAGNOSIS — E1365 Other specified diabetes mellitus with hyperglycemia: Secondary | ICD-10-CM | POA: Diagnosis not present

## 2023-08-09 DIAGNOSIS — E1322 Other specified diabetes mellitus with diabetic chronic kidney disease: Secondary | ICD-10-CM | POA: Diagnosis not present

## 2023-08-09 DIAGNOSIS — J449 Chronic obstructive pulmonary disease, unspecified: Secondary | ICD-10-CM | POA: Diagnosis not present

## 2023-08-09 DIAGNOSIS — I5032 Chronic diastolic (congestive) heart failure: Secondary | ICD-10-CM | POA: Diagnosis not present

## 2023-08-10 DIAGNOSIS — H43813 Vitreous degeneration, bilateral: Secondary | ICD-10-CM | POA: Diagnosis not present

## 2023-08-10 DIAGNOSIS — E103593 Type 1 diabetes mellitus with proliferative diabetic retinopathy without macular edema, bilateral: Secondary | ICD-10-CM | POA: Diagnosis not present

## 2023-08-10 DIAGNOSIS — H3582 Retinal ischemia: Secondary | ICD-10-CM | POA: Diagnosis not present

## 2023-08-12 DIAGNOSIS — E1065 Type 1 diabetes mellitus with hyperglycemia: Secondary | ICD-10-CM | POA: Diagnosis not present

## 2023-08-16 DIAGNOSIS — R296 Repeated falls: Secondary | ICD-10-CM | POA: Diagnosis not present

## 2023-08-16 DIAGNOSIS — I69354 Hemiplegia and hemiparesis following cerebral infarction affecting left non-dominant side: Secondary | ICD-10-CM | POA: Diagnosis not present

## 2023-08-16 DIAGNOSIS — R0602 Shortness of breath: Secondary | ICD-10-CM | POA: Diagnosis not present

## 2023-08-16 DIAGNOSIS — E1322 Other specified diabetes mellitus with diabetic chronic kidney disease: Secondary | ICD-10-CM | POA: Diagnosis not present

## 2023-08-16 DIAGNOSIS — E1365 Other specified diabetes mellitus with hyperglycemia: Secondary | ICD-10-CM | POA: Diagnosis not present

## 2023-08-16 DIAGNOSIS — I130 Hypertensive heart and chronic kidney disease with heart failure and stage 1 through stage 4 chronic kidney disease, or unspecified chronic kidney disease: Secondary | ICD-10-CM | POA: Diagnosis not present

## 2023-08-16 DIAGNOSIS — J449 Chronic obstructive pulmonary disease, unspecified: Secondary | ICD-10-CM | POA: Diagnosis not present

## 2023-08-16 DIAGNOSIS — I5032 Chronic diastolic (congestive) heart failure: Secondary | ICD-10-CM | POA: Diagnosis not present

## 2023-08-17 DIAGNOSIS — E109 Type 1 diabetes mellitus without complications: Secondary | ICD-10-CM | POA: Diagnosis not present

## 2023-08-17 DIAGNOSIS — Z794 Long term (current) use of insulin: Secondary | ICD-10-CM | POA: Diagnosis not present

## 2023-08-17 DIAGNOSIS — R300 Dysuria: Secondary | ICD-10-CM | POA: Diagnosis not present

## 2023-08-20 ENCOUNTER — Encounter: Payer: Self-pay | Admitting: Pulmonary Disease

## 2023-08-20 ENCOUNTER — Ambulatory Visit: Payer: Medicare Other | Admitting: Pulmonary Disease

## 2023-08-20 VITALS — BP 110/60 | HR 55 | Ht 67.0 in

## 2023-08-20 DIAGNOSIS — E1165 Type 2 diabetes mellitus with hyperglycemia: Secondary | ICD-10-CM | POA: Diagnosis not present

## 2023-08-20 DIAGNOSIS — R300 Dysuria: Secondary | ICD-10-CM | POA: Diagnosis not present

## 2023-08-20 DIAGNOSIS — G4733 Obstructive sleep apnea (adult) (pediatric): Secondary | ICD-10-CM

## 2023-08-20 DIAGNOSIS — R0902 Hypoxemia: Secondary | ICD-10-CM

## 2023-08-20 MED ORDER — BUDESONIDE-FORMOTEROL FUMARATE 80-4.5 MCG/ACT IN AERO
2.0000 | INHALATION_SPRAY | Freq: Two times a day (BID) | RESPIRATORY_TRACT | 12 refills | Status: DC
Start: 2023-08-20 — End: 2023-11-17

## 2023-08-20 NOTE — Patient Instructions (Signed)
Nice to see you again  Try Symbicort 2 puffs in the morning and 2 puffs in the evening.  Rinse your mouth out thoroughly with water and spit after every use.  See if this helps some with your breathing.  Use for at least 1 month, if no improvement okay to stop.  Continue Flonase as you are.  Return to clinic in 6 months or sooner as needed

## 2023-08-20 NOTE — Progress Notes (Signed)
Eyes:  @Patient  ID: [name], female    DOB: 04-12-1951, 72 y.o.   MRN: [id number]  Chief Complaint  Patient presents with   Follow-up    Pt is here for Chronic respiratory failure with hypoxia F/U visit today. Pt denies SOB and Coughing.    Referring provider: Irven Coe, MD  HPI:   72 y.o. woman whom are seen in follow up for evaluation of pulmonary hypertension and hypoxemia.  Most recent cardiology note x2 reviewed.    Returns for routine follow-up.  Cough has primarily been chief complaint..  Cough much better now.  Not really an issue.  She is using Flonase as needed for nasal congestion with cold seasonal allergies etc.  As well as antihistamines.  He never started the [facility] that was prescribed in past.  She has some chronic shortness of breath.  Really unchanged.  But she is interested in trying something now.  We reviewed how to use Wixela but given her complications from stroke in the past not confident she can use a dry powder inhaler.  Discussed trying to prescribe DPI, will try Symbicort.  Instructions on how to use provided today.  HPI at initial visit: Patient with longstanding cardiac history including intermittent volume overload.  Atrial fibrillation.  On anticoagulation.  Has longstanding dyspnea.  Shortness of breath.  However, she is been largely wheelchair-bound for a couple years.  Following strokes.  She is on oxygen for 6 months.  This is on discharge from the hospital.  Hospital discharge summary 01/2022 reviewed at the time she was placed on oxygen.  At that time she was diuresed.  TTE estimated elevated PASP.  Breathing about the same.  She reports good adherence to oxygen.  She has not used BiPAP.  She has not used in some time.  Does not use regularly.  Despite rest and the importance of using this.  At last hospitalization she was asked to do this.  She is afraid for a few days and does not use it reliably and months.  Reviewed most recent chest  x-ray 05/2019.  Shows enlarged cardiac silhouette, prominent interstitium suggestive of volume overload on my interpretation.  PMH: ESRD on HD, atrial fibrillation, diabetes, hypothyroidism, CVA Surgical history: Hysterectomy, ankle fracture surgery, Family history: Mother with CAD, hyperlipidemia, hypertension, renal disease, father with CVA Social history: Never smoker, lives in [place] / Pulmonary Flowsheets:   ACT:      No data to display          MMRC:     No data to display          Epworth:      No data to display          Tests:   FENO:  No results found for: "NITRICOXIDE"  PFT:     No data to display          WALK:      No data to display          Imaging: Personally reviewed and as per EMR discussion this note No results found.  Lab Results: Personally reviewed CBC    Component Value Date/Time   WBC 4.7 05/29/2022 1326   RBC 3.83 (L) 05/29/2022 1326   HGB 10.9 (L) 05/29/2022 1326   HGB 11.0 (L) 03/02/2022 1548   HCT 35.5 (L) 05/29/2022 1326   HCT 33.8 (L) 03/02/2022 1548   PLT 203 05/29/2022 1326   PLT 185 03/02/2022 1548  MCV 92.7 05/29/2022 1326   MCV 88 03/02/2022 1548   MCH 28.5 05/29/2022 1326   MCHC 30.7 05/29/2022 1326   RDW 15.1 05/29/2022 1326   RDW 12.9 03/02/2022 1548   LYMPHSABS 1.0 02/10/2022 1212   LYMPHSABS 0.9 05/21/2019 0947   MONOABS 0.6 02/10/2022 1212   EOSABS 0.1 02/10/2022 1212   EOSABS 0.0 05/21/2019 0947   BASOSABS 0.0 02/10/2022 1212   BASOSABS 0.0 05/21/2019 0947    BMET    Component Value Date/Time   NA 142 05/29/2022 1326   NA 145 (H) 03/02/2022 1548   K 3.6 05/29/2022 1326   CL 105 05/29/2022 1326   CO2 29 05/29/2022 1326   GLUCOSE 228 (H) 05/29/2022 1326   BUN 10 05/29/2022 1326   BUN 13 03/02/2022 1548   CREATININE 1.07 (H) 05/29/2022 1326   CREATININE 1.03 (H) 08/24/2015 1652   CALCIUM 9.1 05/29/2022 1326   GFRNONAA 56 (L) 05/29/2022 1326   GFRAA >60  06/25/2020 1131    BNP    Component Value Date/Time   BNP 190.5 (H) 05/29/2022 1326    ProBNP No results found for: "PROBNP"  Specialty Problems       Pulmonary Problems   OSA (obstructive sleep apnea)    BiPAP at home Severe Range 05/27/2019: need f/u with sleep clinic in 10 weeks- not yet bringing patients in for in-lab testing for CPAP titration studies, due to the virus pandemic> autoPAP       Dyspnea   Hypoxia   SOB (shortness of breath)    Allergies  Allergen Reactions   Latex Rash    Immunization History  Administered Date(s) Administered   Fluad Quad(high Dose 65+) 11/16/2021   Influenza, High Dose Seasonal PF 10/12/2017   Influenza,inj,Quad PF,6+ Mos 08/20/2013, 09/30/2014, 09/22/2015, 09/24/2016, 12/09/2018, 09/24/2019   Influenza-Unspecified 09/24/2017   PFIZER(Purple Top)SARS-COV-2 Vaccination 06/28/2020   Pneumococcal Conjugate-13 11/24/2016   Pneumococcal Polysaccharide-23 01/25/2012, 06/07/2018   Tdap 01/25/2012   Zoster Recombinant(Shingrix) 04/11/2017    Past Medical History:  Diagnosis Date   Abnormal echocardiogram    Anemia    Arthritis    BPPV (benign paroxysmal positional vertigo), right 06/13/2017   Bradycardia 03/07/2020   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   CHB (complete heart block) (HCC) 03/08/2020   CHF (congestive heart failure) (HCC)    Closed fracture of left proximal humerus 07/08/2019   Decreased range of motion of left shoulder 09/03/2020   Depression    Dizzy 03/07/2020   bradycardia   Edentulous 09/03/2020   GERD (gastroesophageal reflux disease)    Hemiparesis affecting left side as late effect of cerebrovascular accident (CVA) (HCC) 03/08/2020   Hemorrhoids    History of Falls with injury 03/08/2020   Fall resulting in left humeral fracture   Hyperlipidemia    Hypertension    Hypothyroidism    Intracranial vascular stenosis    Lacunar infarction (HCC)    Brain  MRI 07/2021 multiple small remote pontine lacunar infarcts   Near syncope 03/08/2020   Paroxysmal atrial fibrillation (HCC)    Presence of permanent cardiac pacemaker    Proteinuria 03/08/2020   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 06/25/20 06/26/2020   Seasonal allergies    Stroke (HCC)    Thyroid cancer (HCC)    per pt S/p Total Thyroidectomy with Radioactive Iodine Therapy   Type 1 diabetes mellitus (HCC)     Tobacco History: Social History   Tobacco Use  Smoking Status Never  Smokeless Tobacco Never   Counseling given: Not Answered   Continue to not smoke  Outpatient Encounter Medications as of 08/20/2023  Medication Sig   acetaminophen (TYLENOL) 325 MG tablet Take 2 tablets (650 mg total) by mouth every 6 (six) hours as needed for moderate pain.   amLODipine (NORVASC) 10 MG tablet TAKE 1 TABLET(10 MG) BY MOUTH DAILY   apixaban (ELIQUIS) 5 MG TABS tablet Take 1 tablet (5 mg total) by mouth 2 (two) times daily.   atorvastatin (LIPITOR) 80 MG tablet TAKE 1 TABLET(80 MG) BY MOUTH EVERY EVENING   budesonide-formoterol (SYMBICORT) 80-4.5 MCG/ACT inhaler Inhale 2 puffs into the lungs 2 (two) times daily.   busPIRone (BUSPAR) 5 MG tablet TAKE 1 TABLET(5 MG) BY MOUTH THREE TIMES DAILY   cholecalciferol (VITAMIN D3) 25 MCG (1000 UNIT) tablet Take 1,000 Units by mouth daily.   Cranberry 50 MG CHEW Chew by mouth.   Fexofenadine HCl (ALLEGRA PO) Take 10 mg by mouth 2 (two) times daily.   fluticasone (FLONASE) 50 MCG/ACT nasal spray SHAKE LIQUID AND USE 1 SPRAY IN EACH NOSTRIL IN THE MORNING AND AT BEDTIME   furosemide (LASIX) 40 MG tablet Take 1 tablet (40 mg total) by mouth 2 (two) times daily.   gabapentin (NEURONTIN) 300 MG capsule Take 1 capsule (300 mg total) by mouth as needed.   HUMALOG 100 UNIT/ML injection SMARTSIG:100 Unit(s) SUB-Q Daily   Insulin Human (INSULIN PUMP) SOLN Inject 1 each into the skin 3 times daily with meals, bedtime and 2 AM.    levothyroxine (SYNTHROID) 175 MCG tablet TAKE 1 TABLET(175 MCG) BY MOUTH DAILY BEFORE BREAKFAST   loratadine (CLARITIN) 10 MG tablet Take 10 mg by mouth every evening.   metoprolol succinate (TOPROL-XL) 50 MG 24 hr tablet Take 1 tablet (50 mg total) by mouth daily.   multivitamin-lutein (OCUVITE-LUTEIN) CAPS capsule Take 1 capsule by mouth daily.   Omega-3 Fatty Acids (FISH OIL) 300 MG CAPS Take by mouth.   pantoprazole (PROTONIX) 40 MG tablet Take 1 tablet (40 mg total) by mouth 2 (two) times daily.   polyethylene glycol powder (GLYCOLAX/MIRALAX) 17 GM/SCOOP powder Take 17 g by mouth 2 (two) times daily as needed for moderate constipation.   potassium chloride (KLOR-CON) 10 MEQ tablet Take 1 tablet (10 mEq total) by mouth daily.   traMADol (ULTRAM) 50 MG tablet Take 1 tablet (50 mg total) by mouth every 6 (six) hours as needed.   losartan (COZAAR) 25 MG tablet Take 1 tablet (25 mg total) by mouth daily.   No facility-administered encounter medications on file as of 08/20/2023.     Review of Systems  Review of Systems  N/a Physical Exam  BP 110/60 (BP Location: Right Arm, Cuff Size: Large)   Pulse (!) 55   Ht 5\' 7"  (1.702 m)   SpO2 96%   BMI 37.59 kg/m   Wt Readings from Last 5 Encounters:  08/07/23 240 lb (108.9 kg)  05/30/23 240 lb (108.9 kg)  02/21/23 240 lb (108.9 kg)  02/06/23 240 lb (108.9 kg)  09/11/22 253 lb (114.8 kg)    BMI Readings from Last 5 Encounters:  08/20/23 37.59 kg/m  08/07/23 37.59 kg/m  05/30/23 37.59 kg/m  02/21/23 37.59 kg/m  02/06/23 37.59 kg/m     Physical Exam General: Sitting in chair, chronically ill-appearing EOMI, no icterus Neck: Supple, JVP difficult to assess given habitus Pulmonary: Clear, normal work of breathing Cardiovascular: Warm, significant lower extremity edema noted Abdomen: Nondistended, bowel  sounds present MSK: No synovitis, no joint effusion Neuro: Normal gait, global but symmetric weakness in lower extremities,  upper extremities okay Psych: Normal mood, full affect   Assessment & Plan:   Presumed pulmonary hypertension: Based on echocardiogram findings with elevated right-sided pressures.  Likely multifactorial due to group 2 disease given the signs of left-sided volume overload on the chest imaging in the past, group 2 disease in setting of hypoxemia and OSA nonadherent to NIPPV, group 5 disease in the setting of hemodialysis.  Unfortunate she is a very poor candidate for vasodilators given the multifactorial nature of her disease.  She is nonadherent to BiPAP therapy which was explained to her is a foundational treatment of group 3 disease related to OSA.  Currently only using nocturnal oxygen.  Furthermore, she is essentially wheelchair-bound due to prior strokes and weakness.  Do not think she would benefit much in terms of dyspnea with pulmonary vasodilators and given the risk she is a poor candidate.    Chronic hypoxemic respiratory failure: Likely multifactorial related related to OHS, VQ mismatch with presumed pulmonary hypertension, and intermittent volume overload.   Chronic cough: Previously endorsed postnasal drip, nasal congestion.  Addition of Flonase 1 spray each nostril twice daily as well as antihistamines have led to improvement.   Shortness of breath: Chronic issue.  Largely unchanged.  Will try inhaler to see if it helps.  Not very optimistic it well.  Do not think she can use DPI, coordinate opening lids etc. given prior cerebrovascular event.  New prescription of low-dose Symbicort 2 puff twice daily sent to local pharmacy.  Instructions given on how to use.  OSA: Has not tolerated NIPPV after multiple attempts.  Only using nocturnal oxygen.   Return in about 6 months (around 02/20/2024) for f/u Dr. Judeth Horn.   Karren Burly, MD 08/20/2023

## 2023-08-22 DIAGNOSIS — I5032 Chronic diastolic (congestive) heart failure: Secondary | ICD-10-CM | POA: Diagnosis not present

## 2023-08-22 DIAGNOSIS — R296 Repeated falls: Secondary | ICD-10-CM | POA: Diagnosis not present

## 2023-08-22 DIAGNOSIS — E1365 Other specified diabetes mellitus with hyperglycemia: Secondary | ICD-10-CM | POA: Diagnosis not present

## 2023-08-22 DIAGNOSIS — I130 Hypertensive heart and chronic kidney disease with heart failure and stage 1 through stage 4 chronic kidney disease, or unspecified chronic kidney disease: Secondary | ICD-10-CM | POA: Diagnosis not present

## 2023-08-22 DIAGNOSIS — E1322 Other specified diabetes mellitus with diabetic chronic kidney disease: Secondary | ICD-10-CM | POA: Diagnosis not present

## 2023-08-22 DIAGNOSIS — L97411 Non-pressure chronic ulcer of right heel and midfoot limited to breakdown of skin: Secondary | ICD-10-CM | POA: Diagnosis not present

## 2023-08-22 DIAGNOSIS — R0602 Shortness of breath: Secondary | ICD-10-CM | POA: Diagnosis not present

## 2023-08-22 DIAGNOSIS — J449 Chronic obstructive pulmonary disease, unspecified: Secondary | ICD-10-CM | POA: Diagnosis not present

## 2023-08-22 DIAGNOSIS — I87311 Chronic venous hypertension (idiopathic) with ulcer of right lower extremity: Secondary | ICD-10-CM | POA: Diagnosis not present

## 2023-08-22 DIAGNOSIS — I69354 Hemiplegia and hemiparesis following cerebral infarction affecting left non-dominant side: Secondary | ICD-10-CM | POA: Diagnosis not present

## 2023-08-24 DIAGNOSIS — I130 Hypertensive heart and chronic kidney disease with heart failure and stage 1 through stage 4 chronic kidney disease, or unspecified chronic kidney disease: Secondary | ICD-10-CM | POA: Diagnosis not present

## 2023-08-24 DIAGNOSIS — J449 Chronic obstructive pulmonary disease, unspecified: Secondary | ICD-10-CM | POA: Diagnosis not present

## 2023-08-24 DIAGNOSIS — I5032 Chronic diastolic (congestive) heart failure: Secondary | ICD-10-CM | POA: Diagnosis not present

## 2023-08-24 DIAGNOSIS — E1365 Other specified diabetes mellitus with hyperglycemia: Secondary | ICD-10-CM | POA: Diagnosis not present

## 2023-08-24 DIAGNOSIS — E1322 Other specified diabetes mellitus with diabetic chronic kidney disease: Secondary | ICD-10-CM | POA: Diagnosis not present

## 2023-08-24 DIAGNOSIS — I69354 Hemiplegia and hemiparesis following cerebral infarction affecting left non-dominant side: Secondary | ICD-10-CM | POA: Diagnosis not present

## 2023-08-28 ENCOUNTER — Telehealth: Payer: Self-pay | Admitting: Pulmonary Disease

## 2023-08-28 DIAGNOSIS — S81802A Unspecified open wound, left lower leg, initial encounter: Secondary | ICD-10-CM | POA: Diagnosis not present

## 2023-08-28 NOTE — Telephone Encounter (Signed)
Pt calling in bc her budesonide-formoterol (SYMBICORT) 80-4.5 MCG/ACT inhaler  is too expensive & will like [place] to send in something cheaper  Pharmacy: [id number] - [place], [place] - [address] AT [facility] OF [address] & [address]

## 2023-08-29 ENCOUNTER — Other Ambulatory Visit (HOSPITAL_COMMUNITY): Payer: Self-pay

## 2023-08-31 ENCOUNTER — Other Ambulatory Visit (HOSPITAL_COMMUNITY): Payer: Self-pay

## 2023-08-31 NOTE — Telephone Encounter (Signed)
Unable to do test claims/benefits investigation due to recent fill of Symbicort.

## 2023-08-31 NOTE — Telephone Encounter (Signed)
Pt calling back for refill

## 2023-09-04 DIAGNOSIS — E11319 Type 2 diabetes mellitus with unspecified diabetic retinopathy without macular edema: Secondary | ICD-10-CM | POA: Diagnosis not present

## 2023-09-04 DIAGNOSIS — C73 Malignant neoplasm of thyroid gland: Secondary | ICD-10-CM | POA: Diagnosis not present

## 2023-09-04 DIAGNOSIS — I639 Cerebral infarction, unspecified: Secondary | ICD-10-CM | POA: Diagnosis not present

## 2023-09-04 DIAGNOSIS — E109 Type 1 diabetes mellitus without complications: Secondary | ICD-10-CM | POA: Diagnosis not present

## 2023-09-04 DIAGNOSIS — L97909 Non-pressure chronic ulcer of unspecified part of unspecified lower leg with unspecified severity: Secondary | ICD-10-CM | POA: Diagnosis not present

## 2023-09-04 DIAGNOSIS — Z9641 Presence of insulin pump (external) (internal): Secondary | ICD-10-CM | POA: Diagnosis not present

## 2023-09-04 DIAGNOSIS — I10 Essential (primary) hypertension: Secondary | ICD-10-CM | POA: Diagnosis not present

## 2023-09-04 DIAGNOSIS — E890 Postprocedural hypothyroidism: Secondary | ICD-10-CM | POA: Diagnosis not present

## 2023-09-04 DIAGNOSIS — E7800 Pure hypercholesterolemia, unspecified: Secondary | ICD-10-CM | POA: Diagnosis not present

## 2023-09-04 DIAGNOSIS — Z794 Long term (current) use of insulin: Secondary | ICD-10-CM | POA: Diagnosis not present

## 2023-09-05 ENCOUNTER — Ambulatory Visit: Payer: Medicare Other | Admitting: Orthopedic Surgery

## 2023-09-05 DIAGNOSIS — R29898 Other symptoms and signs involving the musculoskeletal system: Secondary | ICD-10-CM

## 2023-09-06 ENCOUNTER — Ambulatory Visit: Payer: Medicare Other | Admitting: Physical Therapy

## 2023-09-07 ENCOUNTER — Encounter: Payer: Self-pay | Admitting: Orthopedic Surgery

## 2023-09-07 NOTE — Progress Notes (Unsigned)
Office Visit Note   Patient: [name]           Date of Birth: 05/05/1951           MRN: [id number] Visit Date: 09/05/2023 Requested by: Irven Coe, MD [address] [place],  [place] [address] PCP: Irven Coe, MD  Subjective: Chief Complaint  Patient presents with   Other    Bilateral upper and lower extremity weakness    HPI: [name] is a 72 y.o. female who presents to the office reporting bilateral leg and arm weakness.  She is in a wheelchair.  She states her son passed away 2 months ago and he wanted her to be able to walk.  Patient has a history of 3 strokes affecting her left-hand side.  This is what has caused her to be in a wheelchair.  She does have a history of multiple falls.  Has not really done therapy since she developed atrial fibrillation.  She does take anticoagulation.  She feels like she may be able to get out of the wheelchair if she has sustained rehabilitation.              ROS: All systems reviewed are negative as they relate to the chief complaint within the history of present illness.  Patient denies fevers or chills.  Assessment & Plan: Visit Diagnoses: No diagnosis found.  Plan: Impression is reasonably good strength despite her being in a wheelchair.  I think she is corrected if she has 21 days of inpatient rehab she may be able to get to the point where she can get around with a walker.  However I do not think she has a discrete medical problem requiring inpatient admission which would be the thing that could get her into an inpatient rehab.  I will refer her back to her primary care provider to see if there is some way that she could qualify for inpatient rehab.  Follow-up with [place] as needed.  Follow-Up Instructions: No follow-ups on file.   Orders:  No orders of the defined types were placed in this encounter.  No orders of the defined types were placed in this encounter.     Procedures: No  procedures performed   Clinical Data: No additional findings.  Objective: Vital Signs: There were no vitals taken for this visit.  Physical Exam:  Constitutional: Patient appears well-developed HEENT:  Head: Normocephalic Eyes:EOM are normal Neck: Normal range of motion Cardiovascular: Normal rate Pulmonary/chest: Effort normal Neurologic: Patient is alert Skin: Skin is warm Psychiatric: Patient has normal mood and affect  Ortho Exam: Ortho exam demonstrates ankle dorsiflexion 5 out of 5 quad hamstring strength also 5- out of 5 strength with hip flexion strength 5- out of 5.  Has range of motion of the knee just to about 90 degrees.  No groin pain with internal or external rotation of either leg.  Left arm has some slightly diminished global strength but she does have good deltoid biceps and triceps strength at 5- out of 5 on the left 5+ out of 5 on the right.  She is able to grip.  Specialty Comments:  No specialty comments available.  Imaging: No results found.   PMFS History: Patient Active Problem List   Diagnosis Date Noted   Pain associated with wound 07/11/2022   Hallux malleus 07/04/2022   Pulmonary HTN (HCC) 06/25/2022   Abnormal weight gain 03/27/2022   Diabetic retinopathy (HCC) 03/27/2022  Hypercholesterolemia 03/27/2022   Leg ulcer (HCC) 03/27/2022   Malignant tumor of thyroid gland (HCC) 03/27/2022   Friction blisters of leg 03/15/2022   Dysuria 03/15/2022   SOB (shortness of breath) 02/10/2022   Suicidal ideation 11/16/2021   Hypoxia 11/16/2021   At risk for adverse drug reaction 10/13/2021   Leukopenia    Diabetic peripheral neuropathy [facility])    Essential hypertension    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   Chronic diastolic congestive heart failure (HCC)    Dyspnea    Weakness    Left pontine stroke (HCC) 08/06/2021   BMI 40.0-44.9, adult (HCC) 08/06/2021   Weakness of left side of body 08/04/2021   Wound of left foot  07/26/2021   Unable to perform bed mobility without assistance 07/26/2021   Pressure ulcer 06/29/2021   Stiffness due to immobility 05/18/2021   Healthcare maintenance 02/17/2021   Mild cognitive impairment 09/03/2020   Presbyopia 09/03/2020   Counseling regarding advanced directives 09/03/2020   Constipation 09/03/2020   Imbalance 09/03/2020   Decreased range of motion of left shoulder 09/03/2020   Chronic lower back pain 07/27/2020   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 06/25/20 06/26/2020   Chronic DOAC therapy 03/08/2020   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) 03/08/2020   History of Falls with injury 03/08/2020   Hypoalbuminemia 03/08/2020   Secondary hypercoagulable state (HCC) 11/12/2019   Dependent for wheelchair mobility 08/05/2019   OSA (obstructive sleep apnea) 05/27/2019   Paroxysmal atrial fibrillation (HCC) 05/27/2019   Insulin pump in place    (HFpEF) heart failure with preserved ejection fraction (HCC) 03/17/2019   Mood disorder (HCC) 10/29/2018   Hypertension associated with diabetes (HCC) 10/29/2015   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)    Lower extremity edema 08/20/2013   Morbid obesity (HCC) 05/29/2013   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   Hypothyroidism, 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   GERD (gastroesophageal reflux disease) 01/25/2012   Past Medical History:  Diagnosis Date   Abnormal echocardiogram    Anemia    Arthritis    BPPV (benign paroxysmal positional vertigo), right 06/13/2017   Bradycardia 03/07/2020   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   CHB (complete heart block) (HCC) 03/08/2020   CHF (congestive heart failure) (HCC)    Closed fracture of left proximal humerus 07/08/2019   Decreased range of motion of left shoulder 09/03/2020    Depression    Dizzy 03/07/2020   bradycardia   Edentulous 09/03/2020   GERD (gastroesophageal reflux disease)    Hemiparesis affecting left side as late effect of cerebrovascular accident (CVA) (HCC) 03/08/2020   Hemorrhoids    History of Falls with injury 03/08/2020   Fall resulting in left humeral fracture   Hyperlipidemia    Hypertension    Hypothyroidism    Intracranial vascular stenosis    Lacunar infarction (HCC)    Brain MRI 07/2021 multiple small remote pontine lacunar infarcts   Near syncope 03/08/2020   Paroxysmal atrial fibrillation (HCC)    Presence of permanent cardiac pacemaker    Proteinuria 03/08/2020   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 06/25/20 06/26/2020   Seasonal allergies    Stroke (HCC)    Thyroid cancer (HCC)    per pt S/p Total Thyroidectomy with Radioactive Iodine Therapy   Type 1 diabetes mellitus (HCC)  Family History  Problem Relation Age of Onset   Heart disease Mother    Hyperlipidemia Mother    Hypertension Mother    Renal Disease Mother        insufficiency   Heart attack Mother        in his 72's   Early death Father        Head Injury at Work   Hyperlipidemia Father    Hypertension Father    Stroke Son    Diabetes Maternal Aunt    Prostate cancer Paternal Uncle    Heart disease Maternal Aunt    Heart failure Maternal Grandfather    Heart disease Maternal Grandfather    Heart attack Maternal Grandfather        Died in his 73's   Breast cancer Neg Hx     Past Surgical History:  Procedure Laterality Date   ABDOMINAL ANGIOGRAM N/A 05/14/2012   Procedure: ABDOMINAL ANGIOGRAM;  Surgeon: [name], MD;  Location: [facility] CATH LAB;  Service: Cardiovascular;  Laterality: N/A;   ABDOMINAL HYSTERECTOMY  1995   partial   ANKLE FRACTURE SURGERY Right    CARDIAC CATHETERIZATION     with coronary angiogram   cataract  Bilateral 2018   COLONOSCOPY N/A 09/09/2014   Procedure: COLONOSCOPY;  Surgeon: Iva Boop, MD;  Location: [facility] ENDOSCOPY;  Service: Endoscopy;  Laterality: N/A;   ESOPHAGOGASTRODUODENOSCOPY (EGD) WITH PROPOFOL N/A 12/03/2018   Procedure: ESOPHAGOGASTRODUODENOSCOPY (EGD) WITH PROPOFOL;  Surgeon: Sherrilyn Rist, MD;  Location[facility] ENDOSCOPY;  Service: Gastroenterology;  Laterality: N/A;   LEFT AND RIGHT HEART CATHETERIZATION WITH CORONARY ANGIOGRAM N/A 05/14/2012   Procedure: LEFT AND RIGHT HEART CATHETERIZATION WITH CORONARY ANGIOGRAM;  Surgeon: [name], MD;  Location: [facility] CATH LAB;  Service: Cardiovascular;  Laterality: N/A;   LOOP RECORDER INSERTION N/A 12/23/2018   Procedure: LOOP RECORDER INSERTION;  Surgeon: Regan Lemming, MD;  Location: [facility] INVASIVE CV LAB;  Service: Cardiovascular;  Laterality: N/A;   LOOP RECORDER REMOVAL N/A 06/25/2020   Procedure: LOOP RECORDER REMOVAL;  Surgeon: Regan Lemming, MD;  Location: [facility] INVASIVE CV LAB;  Service: Cardiovascular;  Laterality: N/A;   MINI METER   09/08/2014   ELECTRONIC INSULIN PUMP   PACEMAKER IMPLANT N/A 06/25/2020   Procedure: PACEMAKER IMPLANT;  Surgeon: Regan Lemming, MD;  Location: [facility] INVASIVE CV LAB;  Service: Cardiovascular;  Laterality: N/A;   TEE WITHOUT CARDIOVERSION N/A 10/16/2018   Procedure: TRANSESOPHAGEAL ECHOCARDIOGRAM (TEE);  Surgeon: Jodelle Red, MD;  Location: [facility] ENDOSCOPY;  Service: Cardiovascular;  Laterality: N/A;   THYROIDECTOMY  2006   for thyroid cancer   Social History   Occupational History   Occupation: retired Runner, broadcasting/film/video  Tobacco Use   Smoking status: Never   Smokeless tobacco: Never  Vaping Use   Vaping status: Never Used  Substance and Sexual Activity   Alcohol use: No   Drug use: No   Sexual activity: Not on file

## 2023-09-10 ENCOUNTER — Emergency Department (HOSPITAL_COMMUNITY)
Admission: EM | Admit: 2023-09-10 | Discharge: 2023-09-12 | Disposition: A | Discharge status: Skilled Nursing Facility | Payer: Medicare Other | Attending: Emergency Medicine | Admitting: Emergency Medicine

## 2023-09-10 ENCOUNTER — Emergency Department (HOSPITAL_COMMUNITY): Payer: Medicare Other

## 2023-09-10 DIAGNOSIS — N390 Urinary tract infection, site not specified: Secondary | ICD-10-CM

## 2023-09-10 DIAGNOSIS — Z139 Encounter for screening, unspecified: Secondary | ICD-10-CM | POA: Diagnosis not present

## 2023-09-10 DIAGNOSIS — E876 Hypokalemia: Secondary | ICD-10-CM | POA: Diagnosis not present

## 2023-09-10 DIAGNOSIS — R5381 Other malaise: Secondary | ICD-10-CM

## 2023-09-10 DIAGNOSIS — R0989 Other specified symptoms and signs involving the circulatory and respiratory systems: Secondary | ICD-10-CM | POA: Diagnosis not present

## 2023-09-10 DIAGNOSIS — R2689 Other abnormalities of gait and mobility: Secondary | ICD-10-CM | POA: Insufficient documentation

## 2023-09-10 DIAGNOSIS — M6281 Muscle weakness (generalized): Secondary | ICD-10-CM | POA: Diagnosis not present

## 2023-09-10 DIAGNOSIS — R531 Weakness: Secondary | ICD-10-CM

## 2023-09-10 DIAGNOSIS — E119 Type 2 diabetes mellitus without complications: Secondary | ICD-10-CM | POA: Diagnosis not present

## 2023-09-10 LAB — CBG MONITORING, ED: Glucose-Capillary: 87 mg/dL (ref 70–99)

## 2023-09-10 NOTE — ED Triage Notes (Signed)
Pt to ED POV from home. Pt states she currently lives with her sister. Pt was sent by PCP to be evaluated to go to out patient physical rehab / nursing facility. Pt c/o mobility issues x3 years and states she wants help on learning to ambulate again. Pt is in motorized wheelchair. Pt states she has previously been in and out of nursing facilities for same issues, but never stayed at one til she felt better d/t insurance issues. Pt also c/o chronic blisters on bilateral legs x a few months and has home health wound care RN that sees pt at home 2x a week. Pt has hx of DM. Pt denies fevers at home.

## 2023-09-11 ENCOUNTER — Encounter (HOSPITAL_COMMUNITY): Payer: Self-pay

## 2023-09-11 ENCOUNTER — Other Ambulatory Visit: Payer: Self-pay

## 2023-09-11 LAB — CBC
HCT: 36.6 % (ref 36.0–46.0)
Hemoglobin: 10.9 g/dL — ABNORMAL LOW (ref 12.0–15.0)
MCH: 29.2 pg (ref 26.0–34.0)
MCHC: 29.8 g/dL — ABNORMAL LOW (ref 30.0–36.0)
MCV: 98.1 fL (ref 80.0–100.0)
Platelets: 174 10*3/uL (ref 150–400)
RBC: 3.73 MIL/uL — ABNORMAL LOW (ref 3.87–5.11)
RDW: 13.8 % (ref 11.5–15.5)
WBC: 5.7 10*3/uL (ref 4.0–10.5)
nRBC: 0 % (ref 0.0–0.2)

## 2023-09-11 LAB — URINALYSIS, ROUTINE W REFLEX MICROSCOPIC
Bacteria, UA: NONE SEEN
Bilirubin Urine: NEGATIVE
Glucose, UA: NEGATIVE mg/dL
Hgb urine dipstick: NEGATIVE
Ketones, ur: NEGATIVE mg/dL
Nitrite: NEGATIVE
Protein, ur: NEGATIVE mg/dL
Specific Gravity, Urine: 1.011 (ref 1.005–1.030)
WBC, UA: >50 WBC/hpf (ref 0–5)
pH: 6 (ref 5.0–8.0)

## 2023-09-11 LAB — BASIC METABOLIC PANEL
Anion gap: 11 (ref 5–15)
BUN: 27 mg/dL — ABNORMAL HIGH (ref 8–23)
CO2: 28 mmol/L (ref 22–32)
Calcium: 9 mg/dL (ref 8.9–10.3)
Chloride: 102 mmol/L (ref 98–111)
Creatinine, Ser: 1.08 mg/dL — ABNORMAL HIGH (ref 0.44–1.00)
GFR, Estimated: 55 mL/min — ABNORMAL LOW (ref >60)
Glucose, Bld: 148 mg/dL — ABNORMAL HIGH (ref 70–99)
Potassium: 3.4 mmol/L — ABNORMAL LOW (ref 3.5–5.1)
Sodium: 141 mmol/L (ref 135–145)

## 2023-09-11 LAB — CBG MONITORING, ED
Glucose-Capillary: 202 mg/dL — ABNORMAL HIGH (ref 70–99)
Glucose-Capillary: 142 mg/dL — ABNORMAL HIGH (ref 70–99)
Glucose-Capillary: 146 mg/dL — ABNORMAL HIGH (ref 70–99)
Glucose-Capillary: 140 mg/dL — ABNORMAL HIGH (ref 70–99)

## 2023-09-11 MED ORDER — ACETAMINOPHEN 500 MG PO TABS
1000.0000 mg | ORAL_TABLET | Freq: Once | ORAL | Status: AC
  Administered 2023-09-11: 1000 mg via ORAL
  Filled 2023-09-11: qty 2

## 2023-09-11 MED ORDER — APIXABAN 5 MG PO TABS
5.0000 mg | ORAL_TABLET | Freq: Two times a day (BID) | ORAL | Status: DC
  Administered 2023-09-11 – 2023-09-12 (×3): 5 mg via ORAL
  Filled 2023-09-11: qty 1
  Filled 2023-09-11: qty 2
  Filled 2023-09-11: qty 1

## 2023-09-11 MED ORDER — VITAMIN D 25 MCG (1000 UNIT) PO TABS
1000.0000 [IU] | ORAL_TABLET | Freq: Every day | ORAL | Status: DC
  Administered 2023-09-11 – 2023-09-12 (×2): 1000 [IU] via ORAL
  Filled 2023-09-11 (×2): qty 1

## 2023-09-11 MED ORDER — PANTOPRAZOLE SODIUM 40 MG PO TBEC
40.0000 mg | DELAYED_RELEASE_TABLET | Freq: Two times a day (BID) | ORAL | Status: DC
  Administered 2023-09-11 – 2023-09-12 (×3): 40 mg via ORAL
  Filled 2023-09-11 (×3): qty 1

## 2023-09-11 MED ORDER — MOMETASONE FURO-FORMOTEROL FUM 100-5 MCG/ACT IN AERO
2.0000 | INHALATION_SPRAY | Freq: Two times a day (BID) | RESPIRATORY_TRACT | Status: DC
  Administered 2023-09-11 – 2023-09-12 (×3): 2 via RESPIRATORY_TRACT
  Filled 2023-09-11: qty 8.8

## 2023-09-11 MED ORDER — INSULIN ASPART 100 UNIT/ML IJ SOLN: 0.0000 [IU] | Freq: Every day | INTRAMUSCULAR | Status: DC

## 2023-09-11 MED ORDER — ATORVASTATIN CALCIUM 80 MG PO TABS
80.0000 mg | ORAL_TABLET | Freq: Every day | ORAL | Status: DC
  Administered 2023-09-11 – 2023-09-12 (×2): 80 mg via ORAL
  Filled 2023-09-11 (×2): qty 1

## 2023-09-11 MED ORDER — POLYETHYLENE GLYCOL 3350 17 G PO PACK
17.0000 g | PACK | Freq: Two times a day (BID) | ORAL | Status: DC | PRN
  Filled 2023-09-11: qty 1

## 2023-09-11 MED ORDER — FLUTICASONE PROPIONATE 50 MCG/ACT NA SUSP: 1.0000 | Freq: Every day | NASAL | Status: DC | PRN

## 2023-09-11 MED ORDER — LEVOTHYROXINE SODIUM 75 MCG PO TABS
175.0000 ug | ORAL_TABLET | Freq: Every day | ORAL | Status: DC
  Administered 2023-09-11 – 2023-09-12 (×2): 175 ug via ORAL
  Filled 2023-09-11 (×2): qty 1

## 2023-09-11 MED ORDER — ACETAMINOPHEN 325 MG PO TABS
650.0000 mg | ORAL_TABLET | Freq: Four times a day (QID) | ORAL | Status: DC | PRN
  Administered 2023-09-11 – 2023-09-12 (×3): 650 mg via ORAL
  Filled 2023-09-11 (×3): qty 2

## 2023-09-11 MED ORDER — POLYETHYLENE GLYCOL 3350 17 GM/SCOOP PO POWD: 17.0000 g | Freq: Two times a day (BID) | ORAL | Status: DC | PRN

## 2023-09-11 MED ORDER — INSULIN GLARGINE-YFGN 100 UNIT/ML ~~LOC~~ SOLN
16.0000 [IU] | Freq: Every morning | SUBCUTANEOUS | Status: DC
  Administered 2023-09-11 – 2023-09-12 (×2): 16 [IU] via SUBCUTANEOUS
  Filled 2023-09-11 (×2): qty 0.16

## 2023-09-11 MED ORDER — LOSARTAN POTASSIUM 50 MG PO TABS
25.0000 mg | ORAL_TABLET | Freq: Every day | ORAL | Status: DC
  Administered 2023-09-11 – 2023-09-12 (×2): 25 mg via ORAL
  Filled 2023-09-11 (×2): qty 1

## 2023-09-11 MED ORDER — METOPROLOL SUCCINATE ER 25 MG PO TB24
50.0000 mg | ORAL_TABLET | Freq: Every day | ORAL | Status: DC
  Administered 2023-09-12: 50 mg via ORAL
  Filled 2023-09-11 (×2): qty 2

## 2023-09-11 MED ORDER — INSULIN ASPART 100 UNIT/ML IJ SOLN
0.0000 [IU] | Freq: Three times a day (TID) | INTRAMUSCULAR | Status: DC
  Administered 2023-09-11: 1 [IU] via SUBCUTANEOUS
  Administered 2023-09-12: 3 [IU] via SUBCUTANEOUS

## 2023-09-11 MED ORDER — INSULIN ASPART 100 UNIT/ML IJ SOLN
2.0000 [IU] | Freq: Three times a day (TID) | INTRAMUSCULAR | Status: DC
  Administered 2023-09-12: 2 [IU] via SUBCUTANEOUS

## 2023-09-11 MED ORDER — INSULIN ASPART 100 UNIT/ML IJ SOLN: 3.0000 [IU] | Freq: Three times a day (TID) | INTRAMUSCULAR | Status: DC

## 2023-09-11 MED ORDER — DICLOFENAC SODIUM 1 % EX GEL
4.0000 g | Freq: Four times a day (QID) | CUTANEOUS | Status: DC
  Administered 2023-09-11 – 2023-09-12 (×2): 4 g via TOPICAL
  Filled 2023-09-11: qty 100

## 2023-09-11 MED ORDER — INSULIN ASPART 100 UNIT/ML IJ SOLN: 100.0000 [IU] | Freq: Every day | INTRAMUSCULAR | Status: DC

## 2023-09-11 MED ORDER — GABAPENTIN 300 MG PO CAPS
300.0000 mg | ORAL_CAPSULE | Freq: Three times a day (TID) | ORAL | Status: DC
  Administered 2023-09-11 – 2023-09-12 (×2): 300 mg via ORAL
  Filled 2023-09-11 (×2): qty 1

## 2023-09-11 MED ORDER — PROSIGHT PO TABS
1.0000 | ORAL_TABLET | Freq: Every day | ORAL | Status: DC
  Administered 2023-09-11 – 2023-09-12 (×2): 1 via ORAL
  Filled 2023-09-11 (×2): qty 1

## 2023-09-11 MED ORDER — INSULIN PUMP
1.0000 | Freq: Three times a day (TID) | SUBCUTANEOUS | Status: DC
  Filled 2023-09-11: qty 1

## 2023-09-11 MED ORDER — BUSPIRONE HCL 10 MG PO TABS
5.0000 mg | ORAL_TABLET | Freq: Three times a day (TID) | ORAL | Status: DC
  Administered 2023-09-11 – 2023-09-12 (×4): 5 mg via ORAL
  Filled 2023-09-11 (×4): qty 1

## 2023-09-11 MED ORDER — LORATADINE 10 MG PO TABS
10.0000 mg | ORAL_TABLET | Freq: Every day | ORAL | Status: DC
  Administered 2023-09-11 – 2023-09-12 (×2): 10 mg via ORAL
  Filled 2023-09-11 (×2): qty 1

## 2023-09-11 MED ORDER — AMLODIPINE BESYLATE 5 MG PO TABS
10.0000 mg | ORAL_TABLET | Freq: Every day | ORAL | Status: DC
  Administered 2023-09-11 – 2023-09-12 (×2): 10 mg via ORAL
  Filled 2023-09-11 (×2): qty 2

## 2023-09-11 MED ORDER — FUROSEMIDE 20 MG PO TABS
40.0000 mg | ORAL_TABLET | Freq: Two times a day (BID) | ORAL | Status: DC
  Administered 2023-09-11 – 2023-09-12 (×3): 40 mg via ORAL
  Filled 2023-09-11 (×3): qty 2

## 2023-09-11 NOTE — Progress Notes (Signed)
Patients insurance authorization was approved for SNF. CSW will contact [facility] in the morning and determine when patient can admit to their facility.

## 2023-09-11 NOTE — ED Notes (Signed)
Diet was ordered for patient.

## 2023-09-11 NOTE — Progress Notes (Signed)
CSW spoke with patient who provided CSW with the contact to her home aide. Patient states Rashanda from [facility] will see her 5 days a week for 3 hours. CSW spoke with [facility] [phone]) and confirmed she comes into patients home Monday-Friday from 11:00 AM-2:00 PM. CSW also spoke with [place] with [facility] who confirmed she does have an open case with patient and to keep her updated on patients discharge plan.

## 2023-09-11 NOTE — Progress Notes (Signed)
CSW spoke with Annalee Genta with [facility]. CSW was told patient was discharged from home health RN on 08/28/23 and PT on 08/22/23. CSW was told that they could possibly take patient back for PT if discharged and then determine if RN was needed.

## 2023-09-11 NOTE — TOC Initial Note (Addendum)
Transition of Care [facility]) - Initial/Assessment Note    Patient Details  Name: [name] MRN: [id number] Date of Birth: 1951-02-22  Transition of Care [facility]) CM/SW Contact:    Susa Simmonds, LCSWA Phone Number: 09/11/2023, 9:43 AM  Clinical Narrative:   CSW spoke with patient at bedside. Patient stated she has been talking care of her son for the past 2 years and he recently passed away 2 months ago. Patient stated she had an appointment with her ortho doctor who told her she might be able to walk again if she goes to a skilled nursing facility. Patient stated that she uses 2 liters of oxygen at home. Patient states she no longer uses her insulin pump. Patient stated she hasn't walked in 2 years. CSW explained the possible barriers with her insurance. Patient stated that if her insurance doesn't approve SNF then she will return home. Patient stated she at least wanted to try to see if she would be accepted. Patient stated she would like to go to [facility] or [facility].  Patient currently has home health services with [facility]. CSW contacted Annalee Genta to confirm those services were still active. CSW left a message requesting a call.                   Patient also denied having medicaid and stated she makes to much to qualify   Expected Discharge Plan: Skilled Nursing Facility Barriers to Discharge: Continued Medical Work up   Patient Goals and CMS Choice Patient states their goals for this hospitalization and ongoing recovery are:: Return home or skilled nursing          Expected Discharge Plan and Services       Living arrangements for the past 2 months: Single Family Home                             HH Agency:  Pro[employer], television/film/video)        Prior Living Arrangements/Services Living arrangements for the past 2 months: Single Family Home Lives with:: Siblings Patient language and need for interpreter reviewed:: Yes Do you feel safe going back to the  place where you live?: Yes      Need for Family Participation in Patient Care: No (Comment) Care giver support system in place?: Yes (comment) Current home services: DME (Has wheelchair) Criminal Activity/Legal Involvement Pertinent to Current Situation/Hospitalization: No - Comment as needed  Activities of Daily Living      Permission Sought/Granted   Permission granted to share information with : Yes, Verbal Permission Granted  Share Information with NAME: [name]     Permission granted to share info w Relationship: Daughter  Permission granted to share info w Contact Information: [phone]  Emotional Assessment Appearance:: Appears stated age Attitude/Demeanor/Rapport: Engaged Affect (typically observed): Accepting Orientation: : Oriented to Self, Oriented to Place, Oriented to  Time, Oriented to Situation Alcohol / Substance Use: Not Applicable Psych Involvement: No (comment)  Admission diagnosis:  Blisters Bi-lateral Legs Patient Active Problem List   Diagnosis Date Noted   Pain associated with wound 07/11/2022   Hallux malleus 07/04/2022   Pulmonary HTN (HCC) 06/25/2022   Abnormal weight gain 03/27/2022   Diabetic retinopathy (HCC) 03/27/2022   Hypercholesterolemia 03/27/2022   Leg ulcer (HCC) 03/27/2022   Malignant tumor of thyroid gland (HCC) 03/27/2022   Friction blisters of leg 03/15/2022   Dysuria 03/15/2022   SOB (shortness of breath) 02/10/2022  Suicidal ideation 11/16/2021   Hypoxia 11/16/2021   At risk for adverse drug reaction 10/13/2021   Leukopenia    Diabetic peripheral neuropathy [facility])    Essential hypertension    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   Chronic diastolic congestive heart failure (HCC)    Dyspnea    Weakness    Left pontine stroke (HCC) 08/06/2021   BMI 40.0-44.9, adult (HCC) 08/06/2021   Weakness of left side of body 08/04/2021   Wound of left foot 07/26/2021   Unable to perform bed mobility without  assistance 07/26/2021   Pressure ulcer 06/29/2021   Stiffness due to immobility 05/18/2021   Healthcare maintenance 02/17/2021   Mild cognitive impairment 09/03/2020   Presbyopia 09/03/2020   Counseling regarding advanced directives 09/03/2020   Constipation 09/03/2020   Imbalance 09/03/2020   Decreased range of motion of left shoulder 09/03/2020   Chronic lower back pain 07/27/2020   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 06/25/20 06/26/2020   Chronic DOAC therapy 03/08/2020   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) 03/08/2020   History of Falls with injury 03/08/2020   Hypoalbuminemia 03/08/2020   Secondary hypercoagulable state (HCC) 11/12/2019   Dependent for wheelchair mobility 08/05/2019   OSA (obstructive sleep apnea) 05/27/2019   Paroxysmal atrial fibrillation (HCC) 05/27/2019   Insulin pump in place    (HFpEF) heart failure with preserved ejection fraction (HCC) 03/17/2019   Mood disorder (HCC) 10/29/2018   Hypertension associated with diabetes (HCC) 10/29/2015   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 [place] [address] Phone: [phone] Fax: [phone]     Social Determinants of Health (SDOH) Social History: SDOH Screenings   Food Insecurity: Food Insecurity Present (08/02/2022)  Housing: Low Risk  (07/24/2022)   Transportation Needs: Unmet Transportation Needs (06/17/2020)  Depression (PHQ2-9): Low Risk  (12/29/2021)  Recent Concern: Depression (PHQ2-9) - Medium Risk (11/16/2021)  Tobacco Use: Low Risk  (09/11/2023)   SDOH Interventions:     Readmission Risk Interventions    02/13/2022    1:31 PM  Readmission Risk Prevention Plan  Transportation Screening Complete  PCP or Specialist Appt within 3-5 Days Complete  HRI or Home Care Consult Complete  Social Work Consult for Recovery Care Planning/Counseling Complete  Palliative Care Screening Not Applicable  Medication Review [employer]) Complete

## 2023-09-11 NOTE — Progress Notes (Signed)
CSW spoke with patient at bedside and presented the SNF bed offers. Patient selected [facility]. CSW contacted Olegario Messier in admissions and confirmed the SNF bed offer. Patients insurance authorization has been started in the [facility] portal [phone])

## 2023-09-11 NOTE — [place] FL2 (Signed)
[place] MEDICAID FL2 LEVEL OF CARE FORM     IDENTIFICATION  Patient Name: [name] Birthdate: [birth year] Sex: female Admission Date (Current Location): 09/10/2023  [facility] [place] Number:  Producer, television/film/video and Address:  [facility], [address] [place], [place] [address]      Provider Number: [id number]  Attending Physician Name and Address:  System, Provider Not In  Relative Name and Phone Number:  Molli Knock, Daughter, [phone]    Current Level of Care: Hospital Recommended Level of Care: Skilled Nursing Facility Prior Approval Number:    Date Approved/Denied:   PASRR Number: [id number]  Discharge Plan: SNF    Current Diagnoses: Patient Active Problem List   Diagnosis Date Noted   Pain associated with wound 07/11/2022   Hallux malleus 07/04/2022   Pulmonary HTN (HCC) 06/25/2022   Abnormal weight gain 03/27/2022   Diabetic retinopathy (HCC) 03/27/2022   Hypercholesterolemia 03/27/2022   Leg ulcer (HCC) 03/27/2022   Malignant tumor of thyroid gland (HCC) 03/27/2022   Friction blisters of leg 03/15/2022   Dysuria 03/15/2022   SOB (shortness of breath) 02/10/2022   Suicidal ideation 11/16/2021   Hypoxia 11/16/2021   At risk for adverse drug reaction 10/13/2021   Leukopenia    Diabetic peripheral neuropathy [facility])    Essential hypertension    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   Chronic diastolic congestive heart failure (HCC)    Dyspnea    Weakness    Left pontine stroke (HCC) 08/06/2021   BMI 40.0-44.9, adult (HCC) 08/06/2021   Weakness of left side of body 08/04/2021   Wound of left foot 07/26/2021   Unable to perform bed mobility without assistance 07/26/2021   Pressure ulcer 06/29/2021   Stiffness due to immobility 05/18/2021   Healthcare maintenance 02/17/2021   Mild cognitive impairment 09/03/2020   Presbyopia 09/03/2020   Counseling regarding advanced directives  09/03/2020   Constipation 09/03/2020   Imbalance 09/03/2020   Decreased range of motion of left shoulder 09/03/2020   Chronic lower back pain 07/27/2020   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 06/25/20 06/26/2020   Chronic DOAC therapy 03/08/2020   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) 03/08/2020   History of Falls with injury 03/08/2020   Hypoalbuminemia 03/08/2020   Secondary hypercoagulable state (HCC) 11/12/2019   Dependent for wheelchair mobility 08/05/2019   OSA (obstructive sleep apnea) 05/27/2019   Paroxysmal atrial fibrillation (HCC) 05/27/2019   Insulin pump in place    (HFpEF) heart failure with preserved ejection fraction (HCC) 03/17/2019   Mood disorder (HCC) 10/29/2018   Hypertension associated with diabetes (HCC) 10/29/2015   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)    Lower extremity edema 08/20/2013   Morbid obesity (HCC) 05/29/2013   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   Hypothyroidism, 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   GERD (gastroesophageal reflux disease) 01/25/2012    Orientation RESPIRATION BLADDER Height & Weight     Self, Time, Situation, Place  O2 (2 Liters) Continent Weight: 240 lb 1.3 oz (108.9 kg) Height:  5\' 7"  (170.2 cm)  BEHAVIORAL SYMPTOMS/MOOD NEUROLOGICAL BOWEL NUTRITION STATUS      Continent Diet (Regular)  AMBULATORY STATUS COMMUNICATION OF NEEDS Skin   Extensive Assist Verbally Normal                       Personal  Care Assistance Level of Assistance  Bathing, Feeding, Dressing Bathing Assistance: Maximum assistance Feeding assistance: Independent Dressing Assistance: Maximum assistance     Functional Limitations Info  Sight, Hearing, Speech Sight Info: Impaired (Wears glasses) Hearing Info: Adequate Speech Info: Adequate    SPECIAL CARE FACTORS FREQUENCY                        Contractures Contractures Info: Not present    Additional Factors Info  Allergies   Allergies Info: Latex           Current Medications (09/11/2023):  This is the current hospital active medication list Current Facility-Administered Medications  Medication Dose Route Frequency Provider Last Rate Last Admin   acetaminophen (TYLENOL) tablet 650 mg  650 mg Oral Q6H PRN Roxy Horseman, PA-C   650 mg at 09/11/23 0644   amLODipine (NORVASC) tablet 10 mg  10 mg Oral Daily Roxy Horseman, PA-C   10 mg at 09/11/23 1004   apixaban (ELIQUIS) tablet 5 mg  5 mg Oral BID Roxy Horseman, PA-C   5 mg at 09/11/23 1006   atorvastatin (LIPITOR) tablet 80 mg  80 mg Oral Daily Roxy Horseman, PA-C   80 mg at 09/11/23 1005   busPIRone (BUSPAR) tablet 5 mg  5 mg Oral TID Roxy Horseman, PA-C   5 mg at 09/11/23 1005   cholecalciferol (VITAMIN D3) 25 MCG (1000 UNIT) tablet 1,000 Units  1,000 Units Oral Daily Roxy Horseman, PA-C   1,000 Units at 09/11/23 1005   fluticasone (FLONASE) 50 MCG/ACT nasal spray 1 spray  1 spray Each Nare Daily PRN Roxy Horseman, PA-C       furosemide (LASIX) tablet 40 mg  40 mg Oral BID Roxy Horseman, PA-C   40 mg at 09/11/23 0827   insulin aspart (novoLOG) injection 3-5 Units  3-5 Units Subcutaneous TID WC Wynetta Fines, MD       insulin glargine-yfgn (SEMGLEE) injection 16 Units  16 Units Subcutaneous q AM Wynetta Fines, MD   16 Units at 09/11/23 1007   levothyroxine (SYNTHROID) tablet 175 mcg  175 mcg Oral Q0600 Roxy Horseman, PA-C   175 mcg at 09/11/23 0555   loratadine (CLARITIN) tablet 10 mg  10 mg Oral Daily Roxy Horseman, PA-C   10 mg at 09/11/23 1006   losartan (COZAAR) tablet 25 mg  25 mg Oral Daily Roxy Horseman, PA-C   25 mg at 09/11/23 1007   metoprolol succinate (TOPROL-XL) 24 hr tablet 50 mg  50 mg Oral Daily Roxy Horseman, PA-C       mometasone-formoterol (DULERA) 100-5 MCG/ACT inhaler 2 puff  2 puff Inhalation BID Roxy Horseman, PA-C   2 puff at 09/11/23 1039   multivitamin (PROSIGHT) tablet 1 tablet  1 tablet Oral Daily Roxy Horseman, PA-C   1 tablet at 09/11/23 1011   pantoprazole (PROTONIX) EC tablet 40 mg  40 mg Oral BID Roxy Horseman, PA-C   40 mg at 09/11/23 1004   polyethylene glycol powder (GLYCOLAX/MIRALAX) container 17 g  17 g Oral BID PRN Roxy Horseman, PA-C       Current Outpatient Medications  Medication Sig Dispense Refill   acetaminophen (TYLENOL) 325 MG tablet Take 2 tablets (650 mg total) by mouth every 6 (six) hours as needed for moderate pain. (Patient taking differently: Take 650 mg by mouth every 8 (eight) hours.)     amLODipine (NORVASC) 10 MG tablet TAKE 1 TABLET(10 MG) BY MOUTH DAILY (Patient taking  differently: Take 2.5 mg by mouth daily.) 90 tablet 2   apixaban (ELIQUIS) 5 MG TABS tablet Take 1 tablet (5 mg total) by mouth 2 (two) times daily. 180 tablet 1   atorvastatin (LIPITOR) 80 MG tablet TAKE 1 TABLET(80 MG) BY MOUTH EVERY EVENING 90 tablet 1   b complex vitamins capsule Take 1 capsule by mouth daily.     cholecalciferol (VITAMIN D3) 25 MCG (1000 UNIT) tablet Take 1,000 Units by mouth daily.     Cranberry 50 MG CHEW Chew 1 tablet by mouth daily.     furosemide (LASIX) 40 MG tablet Take 1 tablet (40 mg total) by mouth 2 (two) times daily. 180 tablet 3   gabapentin (NEURONTIN) 300 MG capsule Take 1 capsule (300 mg total) by mouth as needed. (Patient taking differently: Take 300 mg by mouth 3 (three) times daily.) 30 capsule 0   HUMALOG 100 UNIT/ML injection Inject 2-4 Units into the skin 3 (three) times daily before meals.     levothyroxine (SYNTHROID) 175 MCG tablet TAKE 1 TABLET(175 MCG) BY MOUTH DAILY BEFORE BREAKFAST 30 tablet 2   loratadine (CLARITIN) 10 MG tablet Take 10 mg by mouth daily.     losartan (COZAAR) 25 MG tablet Take 1 tablet (25 mg total) by mouth daily. 90 tablet 3   metoprolol succinate (TOPROL-XL) 50 MG 24 hr tablet Take 1 tablet (50 mg total) by mouth  daily. 90 tablet 3   multivitamin-lutein (OCUVITE-LUTEIN) CAPS capsule Take 1 capsule by mouth daily.     OXYGEN Inhale 2 L into the lungs at bedtime.     pantoprazole (PROTONIX) 40 MG tablet Take 1 tablet (40 mg total) by mouth 2 (two) times daily. 60 tablet 0   polyethylene glycol powder (GLYCOLAX/MIRALAX) 17 GM/SCOOP powder Take 17 g by mouth 2 (two) times daily as needed for moderate constipation. (Patient taking differently: Take 17 g by mouth daily.) 3350 g 3   potassium chloride (KLOR-CON) 10 MEQ tablet Take 1 tablet (10 mEq total) by mouth daily. 30 tablet 0   TRESIBA FLEXTOUCH 100 UNIT/ML FlexTouch Pen Inject 20 Units into the skin daily.     WIXELA INHUB 500-50 MCG/ACT AEPB Inhale 1 puff into the lungs in the morning and at bedtime.     budesonide-formoterol (SYMBICORT) 80-4.5 MCG/ACT inhaler Inhale 2 puffs into the lungs 2 (two) times daily. (Patient not taking: Reported on 09/11/2023) 1 each 12   busPIRone (BUSPAR) 5 MG tablet TAKE 1 TABLET(5 MG) BY MOUTH THREE TIMES DAILY (Patient not taking: Reported on 09/11/2023) 90 tablet 0   Fexofenadine HCl (ALLEGRA PO) Take 10 mg by mouth 2 (two) times daily. (Patient not taking: Reported on 09/11/2023)     fluticasone (FLONASE) 50 MCG/ACT nasal spray SHAKE LIQUID AND USE 1 SPRAY IN EACH NOSTRIL IN THE MORNING AND AT BEDTIME (Patient not taking: Reported on 09/11/2023) 16 g 0   Insulin Human (INSULIN PUMP) SOLN Inject 1 each into the skin 3 times daily with meals, bedtime and 2 AM. (Patient not taking: Reported on 09/11/2023)       Discharge Medications: Please see discharge summary for a list of discharge medications.  Relevant Imaging Results:  Relevant Lab Results:   Additional Information SSN: [id number]  Susa Simmonds, LCSWA

## 2023-09-11 NOTE — ED Provider Notes (Signed)
Emergency Medicine Observation Re-evaluation Note  [name] Marleigh Angelico is a 72 y.o. female, seen on rounds today.  Pt initially presented to the ED for complaints of SNF Placement  Currently, the patient is resting.  Physical Exam  BP (!) 140/54   Pulse (!) 50   Temp 97.6 F (36.4 C)   Resp 18   SpO2 100%  Physical Exam General: NAD   ED Course / MDM  EKG:   I have reviewed the labs performed to date as well as medications administered while in observation.   Plan  Current plan is for pending PT/OT/TOC consults.    Wynetta Fines, MD 09/11/23 [phone]

## 2023-09-11 NOTE — Progress Notes (Signed)
CSW received a call back from [employer]. Patient is over income to qualify for Medicaid.

## 2023-09-11 NOTE — ED Provider Notes (Signed)
[facility]-EMERGENCY DEPT [facility] Emergency Department Provider Note MRN:  [id number]  Arrival date & time: 09/11/23     Chief Complaint   SNF Placement    History of Present Illness   [name] is a 72 y.o. year-old female presents to the ED with chief complaint of requesting placement in a SNF.  Was reportedly sent in by her PCP to have PT/OT/SW eval.  Patient states that she has trouble completing ADLs.  She uses a mechanical wheel chair.  She states that she has been using oxygen at night, but this is not new or different for her.  She denies any new recent illness.  History provided by patient.   Review of Systems  Pertinent positive and negative review of systems noted in HPI.    Physical Exam   Vitals:   09/11/23 0215 09/11/23 0400  BP: 123/67 (!) 102/46  Pulse: (!) 50 (!) 54  Resp: 16 16  Temp:    SpO2: 100% 100%    CONSTITUTIONAL:  non toxic-appearing, NAD NEURO:  Alert and oriented x 3, CN 3-12 grossly intact EYES:  eyes equal and reactive ENT/NECK:  Supple, no stridor  CARDIO:  normal rate, regular rhythm, appears well-perfused  PULM:  No respiratory distress, CTAB  GI/GU:  non-distended,  MSK/SPINE:  No gross deformities, no edema, moves all extremities  SKIN:  no rash, atraumatic   *Additional and/or pertinent findings included in MDM below  Diagnostic and Interventional Summary    EKG Interpretation Date/Time:    Ventricular Rate:    PR Interval:    QRS Duration:    QT Interval:    QTC Calculation:   R Axis:      Text Interpretation:         Labs Reviewed  CBC - Abnormal; Notable for the following components:      Result Value   RBC 3.73 (*)    Hemoglobin 10.9 (*)    MCHC 29.8 (*)    All other components within normal limits  BASIC METABOLIC PANEL - Abnormal; Notable for the following components:   Potassium 3.4 (*)    Glucose, Bld 148 (*)    BUN 27 (*)    Creatinine, Ser 1.08 (*)    GFR, Estimated 55 (*)    All  other components within normal limits  URINALYSIS, ROUTINE W REFLEX MICROSCOPIC - Abnormal; Notable for the following components:   APPearance HAZY (*)    Leukocytes,Ua LARGE (*)    All other components within normal limits  CBG MONITORING, ED    DG Chest Port 1 View    (Results Pending)    Medications  acetaminophen (TYLENOL) tablet 1,000 mg (1,000 mg Oral Given 09/11/23 0108)     Procedures  /  Critical Care Procedures  ED Course and Medical Decision Making  I have reviewed the triage vital signs, the nursing notes, and pertinent available records from the EMR.  Social Determinants Affecting Complexity of Care: Patient has problems living alone.   ED Course:    Medical Decision Making Amount and/or Complexity of Data Reviewed Labs: ordered. Radiology: ordered.    Details: CLINICAL DATA: Nonambulatory, lower extremity wounds, diabetes  EXAM: PORTABLE CHEST 1 VIEW  COMPARISON: 05/29/2022  FINDINGS: Single frontal view of the chest demonstrates stable dual lead pacemaker. The cardiac silhouette is enlarged but unchanged. Increased pulmonary vascular congestion, without focal consolidation, effusion, or pneumothorax. No acute bony abnormalities.  IMPRESSION: 1. Enlarged cardiac silhouette. 2. Pulmonary vascular congestion without  overt edema.   Electronically Signed By: Sharlet Salina M.D. On: 09/10/2023 23:39    Risk OTC drugs.         Consultants: TOC and PT/OT eval consultations requested.   Treatment and Plan: Disposition pending PT/OT and TOC consults.  I did have a very frank discussion with the patient that we might not be able to secure any placement, but since she has waited 9.5 hours and it's the middle of the night, I'll hold her until morning when she can be evaluated.  She expresses understanding.    Final Clinical Impressions(s) / ED Diagnoses     ICD-10-CM   1. Encounter for medical screening examination  Z13.9       ED  Discharge Orders     None         Discharge Instructions Discussed with and Provided to Patient:   Discharge Instructions   None      Roxy Horseman, PA-C 09/11/23 0410    Palumbo, April, MD 09/11/23 [phone]

## 2023-09-11 NOTE — Progress Notes (Signed)
CSW spoke with [name] in financial counseling who will screen patient to see if she qualifies for medicaid. CSW also contacted Lucila Maine with [facility] to see if she still has an open case with patient. CSW left a message.

## 2023-09-11 NOTE — Inpatient Diabetes Management (Addendum)
Inpatient Diabetes Program Recommendations  AACE/ADA: New Consensus Statement on Inpatient Glycemic Control (2015)  Target Ranges:  Prepandial:   less than 140 mg/dL      Peak postprandial:   less than 180 mg/dL (1-2 hours)      Critically ill patients:  140 - 180 mg/dL   Lab Results  Component Value Date   GLUCAP 202 (H) 09/11/2023   HGBA1C 8.3 (H) 02/11/2022    Review of Glycemic Control  Diabetes history: DM type 1 Outpatient Diabetes medications: insulin pump in the past now on Tresiba 16 units, Novolog 3-5 units tid Current orders for Inpatient glycemic control:  Semglee 16 units Daily Novolog 3-5 units tid  Inpatient Diabetes Program Recommendations:    -   D/c current Novolog orders -   Add Novolog 0-9 units tid + hs -   Add Novolog 2 units tid meal coverage if eating >50 % of meals  Pt saw her Endocrinologist on 9/10 and was taken off of her insulin pump and placed on Tresiba 16 units and Humalog 3-5 units tid. Pt reported not being able to mange her insulin pump at that time.. Follow up within 3 months with Dr. Talmage Nap.  Thanks,  Christena Deem RN, MSN, BC-ADM Inpatient Diabetes Coordinator Team Pager [phone] (8a-5p)

## 2023-09-11 NOTE — Evaluation (Signed)
Physical Therapy Evaluation Patient Details Name: [name] MRN: [id number] DOB: 04-21-1951 Today's Date: 09/11/2023  History of Present Illness  Patient is a 72 y/o female who presented to ED 09/10/23 due to desire for rehab admission.  She has had L LE weakness, but reports now with more R LE weakness and using a lift for transfers.  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  Clinical Impression  Patient presents with decreased mobility due to weakness, L>R LE, decreased balance, decreased safety, increased tone, and poor activity tolerance.  She normally mobilizes to wheelchair and [facility] at home with use of a standing lift.  She has an aide 3 hours 5 days a week and assistance from her daughter and niece on the weekend.  She uses an Tree surgeon though cannot access her bathroom at home and has to have help to pivot in and out of the car.  Seems has progressive weakness now that R side is getting weaker too.  Today she was unable to pivot with +2 A to her wheelchair and needed increased time/attempts to stand at the bedside.  She will benefit from skilled PT during acute stay and feel she is motivated to improve her mobility and could make progress at inpatient rehab (< 3 hours/day) prior to d/c home.          If plan is discharge home, recommend the following: Two people to help with walking and/or transfers;Two people to help with bathing/dressing/bathroom;Direct supervision/assist for medications management   Can travel by private vehicle   No    Equipment Recommendations None recommended by PT  Recommendations for Other Services       Functional Status Assessment Patient has had a recent decline in their functional status and demonstrates the ability to make significant improvements in function in a reasonable and  predictable amount of time.     Precautions / Restrictions Precautions Precautions: Fall      Mobility  Bed Mobility Overal bed mobility: Needs Assistance Bed Mobility: Supine to Sit, Sit to Supine     Supine to sit: Mod assist, HOB elevated Sit to supine: Mod assist, +2 for safety/equipment   General bed mobility comments: up to EOB assist for finishing getting legs off EOB and to lift trunk upright, to supine assist for legs onto bed and to scoot hips over and up in bed    Transfers Overall transfer level: Needs assistance Equipment used: 2 person hand held assist Transfers: Sit to/from Stand Sit to Stand: Max assist, +2 physical assistance           General transfer comment: several attempts needed to achieve full standing with lifting support and pt needing +2 to achieve standing, though unable to take steps to pivot to wheelchair despite multiple trials.    Ambulation/Gait                  Stairs            Wheelchair Mobility     Tilt Bed    Modified Rankin (Stroke Patients Only)       Balance Overall balance assessment: Needs assistance Sitting-balance support: Feet unsupported Sitting balance-Leahy Scale: Poor Sitting balance - Comments: CGA sitting edge of stretcher due to pt close to edge to get feet on the floor   Standing balance support: Bilateral upper extremity supported  Standing balance-Leahy Scale: Zero Standing balance comment: stands with +2 A briefly                             Pertinent Vitals/Pain Pain Assessment Pain Assessment: No/denies pain    Home Living Family/patient expects to be discharged to:: Private residence Living Arrangements: Other relatives Available Help at Discharge: Family Type of Home: House Home Access: Ramped entrance     Alternate Level Stairs-Number of Steps: does not go upstairs Home Layout: Two level;Able to live on main level with bedroom/bathroom Home Equipment:  BSC/3in1;Wheelchair - power;Wheelchair Writer (comment) Additional Comments: standing mechanical lift; adjustable bed    Prior Function Prior Level of Function : Needs assist             Mobility Comments: has lift for up to [facility], uses BSC as wheelchair does not fit into bathroom, help to pivot into car, denies falls ADLs Comments: private aide 5 days a week (three hours), sister and adopted daughter help on weekends     Extremity/Trunk Assessment   Upper Extremity Assessment Upper Extremity Assessment: LUE deficits/detail LUE Deficits / Details: shoulder elevation limited to about 100 degrees, strength grossly 4-/5; reports numbness in arm since she broke it 3 years ago LUE Sensation: decreased light touch    Lower Extremity Assessment Lower Extremity Assessment: LLE deficits/detail;RLE deficits/detail RLE Deficits / Details: AAROM limited knee flexion with significant  edema around knees wearing knee hi compression socks, ankle AROM WFL, strength hip flexion 4/5, knee extension 4/5, ankle DF 4+/5 RLE Coordination: decreased gross motor LLE Deficits / Details: AAROM limited knee flexion with significant edema around knees wearing knee hi compression socks, ankle AROM WFL, strength hip flexion 3+/5, knee extension 4-/5, ankle DF 4/5 LLE Coordination: decreased gross motor    Cervical / Trunk Assessment Cervical / Trunk Assessment: Kyphotic  Communication   Communication Communication: No apparent difficulties  Cognition Arousal: Alert Behavior During Therapy: WFL for tasks assessed/performed Overall Cognitive Status: Within Functional Limits for tasks assessed                                 General Comments: manages finances/meds though has given some information to her neice to help        General Comments General comments (skin integrity, edema, etc.): VSS on 2L O2, pt SOB with attempts to stand; Eager to get to rehab to work on getting up easier  and hopeful to be able to walk some    Exercises     Assessment/Plan    PT Assessment Patient needs continued PT services  PT Problem List Decreased strength;Decreased balance;Impaired tone;Decreased mobility;Decreased range of motion;Decreased activity tolerance;Decreased safety awareness       PT Treatment Interventions DME instruction;Functional mobility training;Balance training;Patient/family education;Therapeutic activities;Gait training;Therapeutic exercise;Wheelchair mobility training;Neuromuscular re-education    PT Goals (Current goals can be found in the Care Plan section)  Acute Rehab PT Goals Patient Stated Goal: go to rehab; walk PT Goal Formulation: With patient Time For Goal Achievement: 09/25/23 Potential to Achieve Goals: Fair    Frequency Min 1X/week     Co-evaluation               AM-PAC PT "6 Clicks" Mobility  Outcome Measure Help needed turning from your back to your side while in a flat bed without using bedrails?: Total Help needed moving from lying  on your back to sitting on the side of a flat bed without using bedrails?: Total Help needed moving to and from a bed to a chair (including a wheelchair)?: Total Help needed standing up from a chair using your arms (e.g., wheelchair or bedside chair)?: Total Help needed to walk in hospital room?: Total Help needed climbing 3-5 steps with a railing? : Total 6 Click Score: 6    End of Session Equipment Utilized During Treatment: Gait belt;Oxygen Activity Tolerance: Patient limited by fatigue Patient left: with call bell/phone within reach;in bed Nurse Communication: Need for lift equipment PT Visit Diagnosis: Other abnormalities of gait and mobility (R26.89);Muscle weakness (generalized) (M62.81)    Time: 1610-9604 PT Time Calculation (min) (ACUTE ONLY): 38 min   Charges:   PT Evaluation $PT Eval Moderate Complexity: 1 Mod PT Treatments $Therapeutic Activity: 8-22 mins PT General Charges $$  ACUTE PT VISIT: 1 Visit         Sheran Lawless, PT Acute Rehabilitation Services Office:[phone] 09/11/2023   Elray Mcgregor 09/11/2023, 1:10 PM

## 2023-09-12 DIAGNOSIS — K5900 Constipation, unspecified: Secondary | ICD-10-CM | POA: Diagnosis not present

## 2023-09-12 DIAGNOSIS — R300 Dysuria: Secondary | ICD-10-CM | POA: Diagnosis not present

## 2023-09-12 DIAGNOSIS — I480 Paroxysmal atrial fibrillation: Secondary | ICD-10-CM | POA: Diagnosis not present

## 2023-09-12 DIAGNOSIS — E119 Type 2 diabetes mellitus without complications: Secondary | ICD-10-CM | POA: Diagnosis not present

## 2023-09-12 DIAGNOSIS — I4891 Unspecified atrial fibrillation: Secondary | ICD-10-CM | POA: Diagnosis not present

## 2023-09-12 DIAGNOSIS — R2689 Other abnormalities of gait and mobility: Secondary | ICD-10-CM | POA: Diagnosis not present

## 2023-09-12 DIAGNOSIS — I509 Heart failure, unspecified: Secondary | ICD-10-CM | POA: Diagnosis not present

## 2023-09-12 DIAGNOSIS — N390 Urinary tract infection, site not specified: Secondary | ICD-10-CM | POA: Diagnosis not present

## 2023-09-12 DIAGNOSIS — Z9181 History of falling: Secondary | ICD-10-CM | POA: Diagnosis not present

## 2023-09-12 DIAGNOSIS — Z950 Presence of cardiac pacemaker: Secondary | ICD-10-CM | POA: Diagnosis not present

## 2023-09-12 DIAGNOSIS — I10 Essential (primary) hypertension: Secondary | ICD-10-CM | POA: Diagnosis not present

## 2023-09-12 DIAGNOSIS — G4733 Obstructive sleep apnea (adult) (pediatric): Secondary | ICD-10-CM | POA: Diagnosis not present

## 2023-09-12 DIAGNOSIS — G459 Transient cerebral ischemic attack, unspecified: Secondary | ICD-10-CM | POA: Diagnosis not present

## 2023-09-12 DIAGNOSIS — M6281 Muscle weakness (generalized): Secondary | ICD-10-CM | POA: Diagnosis not present

## 2023-09-12 DIAGNOSIS — G8929 Other chronic pain: Secondary | ICD-10-CM | POA: Diagnosis not present

## 2023-09-12 DIAGNOSIS — I639 Cerebral infarction, unspecified: Secondary | ICD-10-CM | POA: Diagnosis not present

## 2023-09-12 DIAGNOSIS — E782 Mixed hyperlipidemia: Secondary | ICD-10-CM | POA: Diagnosis not present

## 2023-09-12 DIAGNOSIS — I69398 Other sequelae of cerebral infarction: Secondary | ICD-10-CM | POA: Diagnosis not present

## 2023-09-12 DIAGNOSIS — N1831 Chronic kidney disease, stage 3a: Secondary | ICD-10-CM | POA: Diagnosis not present

## 2023-09-12 DIAGNOSIS — J302 Other seasonal allergic rhinitis: Secondary | ICD-10-CM | POA: Diagnosis not present

## 2023-09-12 DIAGNOSIS — E785 Hyperlipidemia, unspecified: Secondary | ICD-10-CM | POA: Diagnosis not present

## 2023-09-12 DIAGNOSIS — M545 Low back pain, unspecified: Secondary | ICD-10-CM | POA: Diagnosis not present

## 2023-09-12 DIAGNOSIS — K219 Gastro-esophageal reflux disease without esophagitis: Secondary | ICD-10-CM | POA: Diagnosis not present

## 2023-09-12 DIAGNOSIS — E890 Postprocedural hypothyroidism: Secondary | ICD-10-CM | POA: Diagnosis not present

## 2023-09-12 DIAGNOSIS — I5032 Chronic diastolic (congestive) heart failure: Secondary | ICD-10-CM | POA: Diagnosis not present

## 2023-09-12 DIAGNOSIS — Z23 Encounter for immunization: Secondary | ICD-10-CM | POA: Diagnosis not present

## 2023-09-12 DIAGNOSIS — J9611 Chronic respiratory failure with hypoxia: Secondary | ICD-10-CM | POA: Diagnosis not present

## 2023-09-12 DIAGNOSIS — D649 Anemia, unspecified: Secondary | ICD-10-CM | POA: Diagnosis not present

## 2023-09-12 DIAGNOSIS — R531 Weakness: Secondary | ICD-10-CM | POA: Diagnosis not present

## 2023-09-12 DIAGNOSIS — R52 Pain, unspecified: Secondary | ICD-10-CM | POA: Diagnosis not present

## 2023-09-12 DIAGNOSIS — Z139 Encounter for screening, unspecified: Secondary | ICD-10-CM | POA: Diagnosis not present

## 2023-09-12 DIAGNOSIS — E039 Hypothyroidism, unspecified: Secondary | ICD-10-CM | POA: Diagnosis not present

## 2023-09-12 DIAGNOSIS — E1042 Type 1 diabetes mellitus with diabetic polyneuropathy: Secondary | ICD-10-CM | POA: Diagnosis not present

## 2023-09-12 DIAGNOSIS — E1059 Type 1 diabetes mellitus with other circulatory complications: Secondary | ICD-10-CM | POA: Diagnosis not present

## 2023-09-12 DIAGNOSIS — R2681 Unsteadiness on feet: Secondary | ICD-10-CM | POA: Diagnosis not present

## 2023-09-12 DIAGNOSIS — I442 Atrioventricular block, complete: Secondary | ICD-10-CM | POA: Diagnosis not present

## 2023-09-12 DIAGNOSIS — R278 Other lack of coordination: Secondary | ICD-10-CM | POA: Diagnosis not present

## 2023-09-12 DIAGNOSIS — J449 Chronic obstructive pulmonary disease, unspecified: Secondary | ICD-10-CM | POA: Diagnosis not present

## 2023-09-12 DIAGNOSIS — E876 Hypokalemia: Secondary | ICD-10-CM | POA: Diagnosis not present

## 2023-09-12 DIAGNOSIS — Z743 Need for continuous supervision: Secondary | ICD-10-CM | POA: Diagnosis not present

## 2023-09-12 DIAGNOSIS — S80821A Blister (nonthermal), right lower leg, initial encounter: Secondary | ICD-10-CM | POA: Diagnosis not present

## 2023-09-12 DIAGNOSIS — R5381 Other malaise: Secondary | ICD-10-CM | POA: Diagnosis not present

## 2023-09-12 DIAGNOSIS — I69354 Hemiplegia and hemiparesis following cerebral infarction affecting left non-dominant side: Secondary | ICD-10-CM | POA: Diagnosis not present

## 2023-09-12 DIAGNOSIS — E10319 Type 1 diabetes mellitus with unspecified diabetic retinopathy without macular edema: Secondary | ICD-10-CM | POA: Diagnosis not present

## 2023-09-12 LAB — CBG MONITORING, ED
Glucose-Capillary: 88 mg/dL (ref 70–99)
Glucose-Capillary: 237 mg/dL — ABNORMAL HIGH (ref 70–99)

## 2023-09-12 MED ORDER — POTASSIUM CHLORIDE CRYS ER 20 MEQ PO TBCR
40.0000 meq | EXTENDED_RELEASE_TABLET | Freq: Once | ORAL | Status: AC
  Administered 2023-09-12: 40 meq via ORAL
  Filled 2023-09-12: qty 2

## 2023-09-12 MED ORDER — CEPHALEXIN 250 MG PO CAPS
1000.0000 mg | ORAL_CAPSULE | Freq: Once | ORAL | Status: AC
  Administered 2023-09-12: 1000 mg via ORAL
  Filled 2023-09-12: qty 4

## 2023-09-12 MED ORDER — CEPHALEXIN 500 MG PO CAPS: 500.0000 mg | ORAL_CAPSULE | Freq: Four times a day (QID) | ORAL | 0 refills | Status: DC

## 2023-09-12 MED ORDER — POTASSIUM CHLORIDE CRYS ER 20 MEQ PO TBCR: EXTENDED_RELEASE_TABLET | ORAL | 0 refills | Status: DC

## 2023-09-12 NOTE — ED Notes (Signed)
[facility] called  they will be here within an hour

## 2023-09-12 NOTE — ED Provider Notes (Signed)
Patient has been boarding in ED awaiting SNF placement.   TOC team indicates pt is ready for d/c to [facility], insurance auth complete and bed assigned.   Pt is alert, content appearing, eating breakfast. Pt looking forward to ED d/c this AM, and agreeable w d/c plan.   Of note on recent labs, k sl low. Kcl po. UA c/w uti. Pt indicates does have dysuria. No abd pain/tenderness, no flank pain, no fever or chills. Will tx uti. Keflex po.   Pt currently appears stable for d/c to SNF.     Cathren Laine, MD 09/12/23 [phone]

## 2023-09-12 NOTE — Progress Notes (Signed)
CSW spoke with Kia in admissions. Patient is discharging to [facility] and going to room 130b. The number for report is [phone].

## 2023-09-12 NOTE — ED Notes (Signed)
Help get patient bedside tabled cleaned off gave patient some coffee patient has call bell in reach

## 2023-09-12 NOTE — Discharge Instructions (Addendum)
It was our pleasure to provide your ER care today - we hope that you feel better.  Your lab tests show a urine infection - take keflex (antibiotic) as prescribed. Drink plenty of fluids/stay well hydrated.   Your labs also show that your potassium level was slightly low - eat plenty of fruits and vegetables, take potassium supplement as prescribed,  and follow up with primary care doctor in the next couple weeks.   Return to ER if worse, new symptoms, fevers, new/severe pain, chest pain, trouble breathing, or other concern.

## 2023-09-12 NOTE — ED Provider Notes (Signed)
Emergency Medicine Observation Re-evaluation Note  Maneka Baltzell Aleksandra Stith is a 72 y.o. female, seen on rounds today.  Pt initially presented to the ED for complaints of SNF Placement  Currently, the patient is resting comfortably in bed.  No new complaints.  Physical Exam  BP (!) 125/49   Pulse (!) 57   Temp 98.1 F (36.7 C)   Resp 18   Ht 1.702 m (5\' 7" )   Wt 108.9 kg   SpO2 100%   BMI 37.60 kg/m  Physical Exam  ED Course / MDM  EKG:   I have reviewed the labs performed to date as well as medications administered while in observation.  Recent changes in the last 24 hours include none.  Plan  Current plan is for SNF placement.    Lorre Nick, MD 09/12/23 [phone]

## 2023-09-13 DIAGNOSIS — E039 Hypothyroidism, unspecified: Secondary | ICD-10-CM | POA: Diagnosis not present

## 2023-09-13 DIAGNOSIS — I10 Essential (primary) hypertension: Secondary | ICD-10-CM | POA: Diagnosis not present

## 2023-09-13 DIAGNOSIS — N390 Urinary tract infection, site not specified: Secondary | ICD-10-CM | POA: Diagnosis not present

## 2023-09-13 DIAGNOSIS — I69354 Hemiplegia and hemiparesis following cerebral infarction affecting left non-dominant side: Secondary | ICD-10-CM | POA: Diagnosis not present

## 2023-09-13 DIAGNOSIS — E785 Hyperlipidemia, unspecified: Secondary | ICD-10-CM | POA: Diagnosis not present

## 2023-09-13 DIAGNOSIS — R52 Pain, unspecified: Secondary | ICD-10-CM | POA: Diagnosis not present

## 2023-09-13 DIAGNOSIS — I509 Heart failure, unspecified: Secondary | ICD-10-CM | POA: Diagnosis not present

## 2023-09-13 DIAGNOSIS — J449 Chronic obstructive pulmonary disease, unspecified: Secondary | ICD-10-CM | POA: Diagnosis not present

## 2023-09-13 DIAGNOSIS — I4891 Unspecified atrial fibrillation: Secondary | ICD-10-CM | POA: Diagnosis not present

## 2023-09-13 DIAGNOSIS — K219 Gastro-esophageal reflux disease without esophagitis: Secondary | ICD-10-CM | POA: Diagnosis not present

## 2023-09-13 DIAGNOSIS — E119 Type 2 diabetes mellitus without complications: Secondary | ICD-10-CM | POA: Diagnosis not present

## 2023-09-13 DIAGNOSIS — M6281 Muscle weakness (generalized): Secondary | ICD-10-CM | POA: Diagnosis not present

## 2023-09-14 DIAGNOSIS — K5900 Constipation, unspecified: Secondary | ICD-10-CM | POA: Diagnosis not present

## 2023-09-14 DIAGNOSIS — M6281 Muscle weakness (generalized): Secondary | ICD-10-CM | POA: Diagnosis not present

## 2023-09-15 DIAGNOSIS — M6281 Muscle weakness (generalized): Secondary | ICD-10-CM | POA: Diagnosis not present

## 2023-09-15 DIAGNOSIS — J449 Chronic obstructive pulmonary disease, unspecified: Secondary | ICD-10-CM | POA: Diagnosis not present

## 2023-09-15 DIAGNOSIS — E876 Hypokalemia: Secondary | ICD-10-CM | POA: Diagnosis not present

## 2023-09-15 DIAGNOSIS — I509 Heart failure, unspecified: Secondary | ICD-10-CM | POA: Diagnosis not present

## 2023-09-16 DIAGNOSIS — M6281 Muscle weakness (generalized): Secondary | ICD-10-CM | POA: Diagnosis not present

## 2023-09-16 DIAGNOSIS — S80821A Blister (nonthermal), right lower leg, initial encounter: Secondary | ICD-10-CM | POA: Diagnosis not present

## 2023-09-16 DIAGNOSIS — G8929 Other chronic pain: Secondary | ICD-10-CM | POA: Diagnosis not present

## 2023-09-17 DIAGNOSIS — K5900 Constipation, unspecified: Secondary | ICD-10-CM | POA: Diagnosis not present

## 2023-09-17 DIAGNOSIS — E785 Hyperlipidemia, unspecified: Secondary | ICD-10-CM | POA: Diagnosis not present

## 2023-09-17 DIAGNOSIS — M6281 Muscle weakness (generalized): Secondary | ICD-10-CM | POA: Diagnosis not present

## 2023-09-17 DIAGNOSIS — N390 Urinary tract infection, site not specified: Secondary | ICD-10-CM | POA: Diagnosis not present

## 2023-09-17 DIAGNOSIS — E1059 Type 1 diabetes mellitus with other circulatory complications: Secondary | ICD-10-CM | POA: Diagnosis not present

## 2023-09-17 DIAGNOSIS — E039 Hypothyroidism, unspecified: Secondary | ICD-10-CM | POA: Diagnosis not present

## 2023-09-18 DIAGNOSIS — N1831 Chronic kidney disease, stage 3a: Secondary | ICD-10-CM | POA: Diagnosis not present

## 2023-09-18 DIAGNOSIS — R300 Dysuria: Secondary | ICD-10-CM | POA: Diagnosis not present

## 2023-09-18 DIAGNOSIS — D649 Anemia, unspecified: Secondary | ICD-10-CM | POA: Diagnosis not present

## 2023-09-19 DIAGNOSIS — E119 Type 2 diabetes mellitus without complications: Secondary | ICD-10-CM | POA: Diagnosis not present

## 2023-09-19 DIAGNOSIS — I4891 Unspecified atrial fibrillation: Secondary | ICD-10-CM | POA: Diagnosis not present

## 2023-09-19 DIAGNOSIS — M6281 Muscle weakness (generalized): Secondary | ICD-10-CM | POA: Diagnosis not present

## 2023-09-19 DIAGNOSIS — I10 Essential (primary) hypertension: Secondary | ICD-10-CM | POA: Diagnosis not present

## 2023-09-20 ENCOUNTER — Other Ambulatory Visit: Payer: Self-pay | Admitting: *Deleted

## 2023-09-20 NOTE — Patient Outreach (Signed)
Late entry for 09/19/23. Mrs. Groh resides in [facility] skilled nursing facility. Screening for potential care coordination/ chronic care management services as a benefit of health plan and primary care provider.  Collaboration with [facility] therapy manager and Child psychotherapist. Anticipated transition plan is to return home with caregiver assist.   PCP office [facility] has care management services.   Will continue to follow for potential chronic care management/care coordination needs.  Raiford Noble, MSN, RN, BSN [place]  [facility], [facility] RN Post- Acute Care Coordinator Direct Dial: [phone]

## 2023-09-21 DIAGNOSIS — M6281 Muscle weakness (generalized): Secondary | ICD-10-CM | POA: Diagnosis not present

## 2023-09-21 DIAGNOSIS — N390 Urinary tract infection, site not specified: Secondary | ICD-10-CM | POA: Diagnosis not present

## 2023-09-21 DIAGNOSIS — K5900 Constipation, unspecified: Secondary | ICD-10-CM | POA: Diagnosis not present

## 2023-09-22 ENCOUNTER — Other Ambulatory Visit: Payer: Self-pay | Admitting: Pulmonary Disease

## 2023-09-24 DIAGNOSIS — M6281 Muscle weakness (generalized): Secondary | ICD-10-CM | POA: Diagnosis not present

## 2023-09-24 DIAGNOSIS — N390 Urinary tract infection, site not specified: Secondary | ICD-10-CM | POA: Diagnosis not present

## 2023-09-24 DIAGNOSIS — I69354 Hemiplegia and hemiparesis following cerebral infarction affecting left non-dominant side: Secondary | ICD-10-CM | POA: Diagnosis not present

## 2023-09-26 ENCOUNTER — Encounter (HOSPITAL_BASED_OUTPATIENT_CLINIC_OR_DEPARTMENT_OTHER): Payer: Medicare Other | Admitting: General Surgery

## 2023-09-26 DIAGNOSIS — R278 Other lack of coordination: Secondary | ICD-10-CM | POA: Diagnosis not present

## 2023-09-26 DIAGNOSIS — M545 Low back pain, unspecified: Secondary | ICD-10-CM | POA: Diagnosis not present

## 2023-09-26 DIAGNOSIS — R5381 Other malaise: Secondary | ICD-10-CM | POA: Diagnosis not present

## 2023-09-26 DIAGNOSIS — R2689 Other abnormalities of gait and mobility: Secondary | ICD-10-CM | POA: Diagnosis not present

## 2023-09-26 DIAGNOSIS — I69398 Other sequelae of cerebral infarction: Secondary | ICD-10-CM | POA: Diagnosis not present

## 2023-09-27 DIAGNOSIS — M6281 Muscle weakness (generalized): Secondary | ICD-10-CM | POA: Diagnosis not present

## 2023-09-27 DIAGNOSIS — N390 Urinary tract infection, site not specified: Secondary | ICD-10-CM | POA: Diagnosis not present

## 2023-09-27 DIAGNOSIS — Z9181 History of falling: Secondary | ICD-10-CM | POA: Diagnosis not present

## 2023-10-01 DIAGNOSIS — I272 Pulmonary hypertension, unspecified: Secondary | ICD-10-CM | POA: Diagnosis not present

## 2023-10-01 DIAGNOSIS — Z794 Long term (current) use of insulin: Secondary | ICD-10-CM | POA: Diagnosis not present

## 2023-10-01 DIAGNOSIS — Z85850 Personal history of malignant neoplasm of thyroid: Secondary | ICD-10-CM | POA: Diagnosis not present

## 2023-10-01 DIAGNOSIS — I480 Paroxysmal atrial fibrillation: Secondary | ICD-10-CM | POA: Diagnosis not present

## 2023-10-01 DIAGNOSIS — I69354 Hemiplegia and hemiparesis following cerebral infarction affecting left non-dominant side: Secondary | ICD-10-CM | POA: Diagnosis not present

## 2023-10-01 DIAGNOSIS — E11319 Type 2 diabetes mellitus with unspecified diabetic retinopathy without macular edema: Secondary | ICD-10-CM | POA: Diagnosis not present

## 2023-10-01 DIAGNOSIS — E1142 Type 2 diabetes mellitus with diabetic polyneuropathy: Secondary | ICD-10-CM | POA: Diagnosis not present

## 2023-10-01 DIAGNOSIS — I5032 Chronic diastolic (congestive) heart failure: Secondary | ICD-10-CM | POA: Diagnosis not present

## 2023-10-01 DIAGNOSIS — Z9181 History of falling: Secondary | ICD-10-CM | POA: Diagnosis not present

## 2023-10-01 DIAGNOSIS — E7800 Pure hypercholesterolemia, unspecified: Secondary | ICD-10-CM | POA: Diagnosis not present

## 2023-10-01 DIAGNOSIS — I110 Hypertensive heart disease with heart failure: Secondary | ICD-10-CM | POA: Diagnosis not present

## 2023-10-01 DIAGNOSIS — Z79891 Long term (current) use of opiate analgesic: Secondary | ICD-10-CM | POA: Diagnosis not present

## 2023-10-01 DIAGNOSIS — M1990 Unspecified osteoarthritis, unspecified site: Secondary | ICD-10-CM | POA: Diagnosis not present

## 2023-10-01 DIAGNOSIS — K5900 Constipation, unspecified: Secondary | ICD-10-CM | POA: Diagnosis not present

## 2023-10-01 DIAGNOSIS — Z7901 Long term (current) use of anticoagulants: Secondary | ICD-10-CM | POA: Diagnosis not present

## 2023-10-01 DIAGNOSIS — K219 Gastro-esophageal reflux disease without esophagitis: Secondary | ICD-10-CM | POA: Diagnosis not present

## 2023-10-01 DIAGNOSIS — G4733 Obstructive sleep apnea (adult) (pediatric): Secondary | ICD-10-CM | POA: Diagnosis not present

## 2023-10-01 DIAGNOSIS — Z87440 Personal history of urinary (tract) infections: Secondary | ICD-10-CM | POA: Diagnosis not present

## 2023-10-01 DIAGNOSIS — Z9981 Dependence on supplemental oxygen: Secondary | ICD-10-CM | POA: Diagnosis not present

## 2023-10-03 DIAGNOSIS — I69354 Hemiplegia and hemiparesis following cerebral infarction affecting left non-dominant side: Secondary | ICD-10-CM | POA: Diagnosis not present

## 2023-10-03 DIAGNOSIS — Z7901 Long term (current) use of anticoagulants: Secondary | ICD-10-CM | POA: Diagnosis not present

## 2023-10-03 DIAGNOSIS — K219 Gastro-esophageal reflux disease without esophagitis: Secondary | ICD-10-CM | POA: Diagnosis not present

## 2023-10-03 DIAGNOSIS — Z87440 Personal history of urinary (tract) infections: Secondary | ICD-10-CM | POA: Diagnosis not present

## 2023-10-03 DIAGNOSIS — I5032 Chronic diastolic (congestive) heart failure: Secondary | ICD-10-CM | POA: Diagnosis not present

## 2023-10-03 DIAGNOSIS — Z9181 History of falling: Secondary | ICD-10-CM | POA: Diagnosis not present

## 2023-10-03 DIAGNOSIS — I272 Pulmonary hypertension, unspecified: Secondary | ICD-10-CM | POA: Diagnosis not present

## 2023-10-03 DIAGNOSIS — E1142 Type 2 diabetes mellitus with diabetic polyneuropathy: Secondary | ICD-10-CM | POA: Diagnosis not present

## 2023-10-03 DIAGNOSIS — M1990 Unspecified osteoarthritis, unspecified site: Secondary | ICD-10-CM | POA: Diagnosis not present

## 2023-10-03 DIAGNOSIS — G4733 Obstructive sleep apnea (adult) (pediatric): Secondary | ICD-10-CM | POA: Diagnosis not present

## 2023-10-03 DIAGNOSIS — I480 Paroxysmal atrial fibrillation: Secondary | ICD-10-CM | POA: Diagnosis not present

## 2023-10-03 DIAGNOSIS — E7800 Pure hypercholesterolemia, unspecified: Secondary | ICD-10-CM | POA: Diagnosis not present

## 2023-10-03 DIAGNOSIS — Z85850 Personal history of malignant neoplasm of thyroid: Secondary | ICD-10-CM | POA: Diagnosis not present

## 2023-10-03 DIAGNOSIS — E11319 Type 2 diabetes mellitus with unspecified diabetic retinopathy without macular edema: Secondary | ICD-10-CM | POA: Diagnosis not present

## 2023-10-03 DIAGNOSIS — Z794 Long term (current) use of insulin: Secondary | ICD-10-CM | POA: Diagnosis not present

## 2023-10-03 DIAGNOSIS — I110 Hypertensive heart disease with heart failure: Secondary | ICD-10-CM | POA: Diagnosis not present

## 2023-10-03 DIAGNOSIS — Z9981 Dependence on supplemental oxygen: Secondary | ICD-10-CM | POA: Diagnosis not present

## 2023-10-03 DIAGNOSIS — K5900 Constipation, unspecified: Secondary | ICD-10-CM | POA: Diagnosis not present

## 2023-10-03 DIAGNOSIS — Z79891 Long term (current) use of opiate analgesic: Secondary | ICD-10-CM | POA: Diagnosis not present

## 2023-10-04 ENCOUNTER — Ambulatory Visit (INDEPENDENT_AMBULATORY_CARE_PROVIDER_SITE_OTHER): Payer: Medicare Other

## 2023-10-04 DIAGNOSIS — I442 Atrioventricular block, complete: Secondary | ICD-10-CM | POA: Diagnosis not present

## 2023-10-04 LAB — CUP PACEART REMOTE DEVICE CHECK
Battery Remaining Longevity: 103 mo
Battery Voltage: 3 V
Brady Statistic AP VP Percent: 34.31 %
Brady Statistic AP VS Percent: 1.36 %
Brady Statistic AS VP Percent: 60.92 %
Brady Statistic AS VS Percent: 3.41 %
Brady Statistic RA Percent Paced: 34.12 %
Brady Statistic RV Percent Paced: 94.04 %
Date Time Interrogation Session: 20241009200625
Implantable Lead Connection Status: 753985
Implantable Lead Connection Status: 753985
Implantable Lead Implant Date: 20210702
Implantable Lead Implant Date: 20210702
Implantable Lead Location: 753859
Implantable Lead Location: 753860
Implantable Lead Model: 5076
Implantable Lead Model: 5076
Implantable Pulse Generator Implant Date: 20210702
Lead Channel Impedance Value: 285 Ohm
Lead Channel Impedance Value: 380 Ohm
Lead Channel Impedance Value: 399 Ohm
Lead Channel Impedance Value: 456 Ohm
Lead Channel Pacing Threshold Amplitude: 0.5 V
Lead Channel Pacing Threshold Amplitude: 0.625 V
Lead Channel Pacing Threshold Pulse Width: 0.4 ms
Lead Channel Pacing Threshold Pulse Width: 0.4 ms
Lead Channel Sensing Intrinsic Amplitude: 10.25 mV
Lead Channel Sensing Intrinsic Amplitude: 10.25 mV
Lead Channel Sensing Intrinsic Amplitude: 4.5 mV
Lead Channel Sensing Intrinsic Amplitude: 4.5 mV
Lead Channel Setting Pacing Amplitude: 1.5 V
Lead Channel Setting Pacing Amplitude: 2.5 V
Lead Channel Setting Pacing Pulse Width: 0.4 ms
Lead Channel Setting Sensing Sensitivity: 1.2 mV
Zone Setting Status: 755011
Zone Setting Status: 755011

## 2023-10-05 DIAGNOSIS — I10 Essential (primary) hypertension: Secondary | ICD-10-CM | POA: Diagnosis not present

## 2023-10-05 DIAGNOSIS — E1165 Type 2 diabetes mellitus with hyperglycemia: Secondary | ICD-10-CM | POA: Diagnosis not present

## 2023-10-05 DIAGNOSIS — E039 Hypothyroidism, unspecified: Secondary | ICD-10-CM | POA: Diagnosis not present

## 2023-10-05 DIAGNOSIS — R309 Painful micturition, unspecified: Secondary | ICD-10-CM | POA: Diagnosis not present

## 2023-10-05 DIAGNOSIS — I5032 Chronic diastolic (congestive) heart failure: Secondary | ICD-10-CM | POA: Diagnosis not present

## 2023-10-05 DIAGNOSIS — I110 Hypertensive heart disease with heart failure: Secondary | ICD-10-CM | POA: Diagnosis not present

## 2023-10-05 DIAGNOSIS — D649 Anemia, unspecified: Secondary | ICD-10-CM | POA: Diagnosis not present

## 2023-10-05 DIAGNOSIS — E119 Type 2 diabetes mellitus without complications: Secondary | ICD-10-CM | POA: Diagnosis not present

## 2023-10-05 DIAGNOSIS — Z993 Dependence on wheelchair: Secondary | ICD-10-CM | POA: Diagnosis not present

## 2023-10-05 DIAGNOSIS — I480 Paroxysmal atrial fibrillation: Secondary | ICD-10-CM | POA: Diagnosis not present

## 2023-10-05 DIAGNOSIS — Z794 Long term (current) use of insulin: Secondary | ICD-10-CM | POA: Diagnosis not present

## 2023-10-07 ENCOUNTER — Other Ambulatory Visit: Payer: Self-pay | Admitting: Internal Medicine

## 2023-10-07 DIAGNOSIS — I480 Paroxysmal atrial fibrillation: Secondary | ICD-10-CM

## 2023-10-08 DIAGNOSIS — R309 Painful micturition, unspecified: Secondary | ICD-10-CM | POA: Diagnosis not present

## 2023-10-09 DIAGNOSIS — Z79891 Long term (current) use of opiate analgesic: Secondary | ICD-10-CM | POA: Diagnosis not present

## 2023-10-09 DIAGNOSIS — Z85850 Personal history of malignant neoplasm of thyroid: Secondary | ICD-10-CM | POA: Diagnosis not present

## 2023-10-09 DIAGNOSIS — I110 Hypertensive heart disease with heart failure: Secondary | ICD-10-CM | POA: Diagnosis not present

## 2023-10-09 DIAGNOSIS — Z9981 Dependence on supplemental oxygen: Secondary | ICD-10-CM | POA: Diagnosis not present

## 2023-10-09 DIAGNOSIS — E11319 Type 2 diabetes mellitus with unspecified diabetic retinopathy without macular edema: Secondary | ICD-10-CM | POA: Diagnosis not present

## 2023-10-09 DIAGNOSIS — I69354 Hemiplegia and hemiparesis following cerebral infarction affecting left non-dominant side: Secondary | ICD-10-CM | POA: Diagnosis not present

## 2023-10-09 DIAGNOSIS — I480 Paroxysmal atrial fibrillation: Secondary | ICD-10-CM | POA: Diagnosis not present

## 2023-10-09 DIAGNOSIS — Z9181 History of falling: Secondary | ICD-10-CM | POA: Diagnosis not present

## 2023-10-09 DIAGNOSIS — Z87440 Personal history of urinary (tract) infections: Secondary | ICD-10-CM | POA: Diagnosis not present

## 2023-10-09 DIAGNOSIS — E7800 Pure hypercholesterolemia, unspecified: Secondary | ICD-10-CM | POA: Diagnosis not present

## 2023-10-09 DIAGNOSIS — E1142 Type 2 diabetes mellitus with diabetic polyneuropathy: Secondary | ICD-10-CM | POA: Diagnosis not present

## 2023-10-09 DIAGNOSIS — G4733 Obstructive sleep apnea (adult) (pediatric): Secondary | ICD-10-CM | POA: Diagnosis not present

## 2023-10-09 DIAGNOSIS — I5032 Chronic diastolic (congestive) heart failure: Secondary | ICD-10-CM | POA: Diagnosis not present

## 2023-10-09 DIAGNOSIS — Z7901 Long term (current) use of anticoagulants: Secondary | ICD-10-CM | POA: Diagnosis not present

## 2023-10-09 DIAGNOSIS — Z794 Long term (current) use of insulin: Secondary | ICD-10-CM | POA: Diagnosis not present

## 2023-10-09 DIAGNOSIS — K219 Gastro-esophageal reflux disease without esophagitis: Secondary | ICD-10-CM | POA: Diagnosis not present

## 2023-10-09 DIAGNOSIS — K5900 Constipation, unspecified: Secondary | ICD-10-CM | POA: Diagnosis not present

## 2023-10-09 DIAGNOSIS — I272 Pulmonary hypertension, unspecified: Secondary | ICD-10-CM | POA: Diagnosis not present

## 2023-10-09 DIAGNOSIS — M1990 Unspecified osteoarthritis, unspecified site: Secondary | ICD-10-CM | POA: Diagnosis not present

## 2023-10-10 ENCOUNTER — Other Ambulatory Visit: Payer: Self-pay | Admitting: Family Medicine

## 2023-10-10 NOTE — Telephone Encounter (Signed)
Requested Prescriptions  Pending Prescriptions Disp Refills   polyethylene glycol powder (GLYCOLAX/MIRALAX) 17 GM/SCOOP powder [Pharmacy Med Name: POLYETH GLYCOL [id number] NF POWDER [id number]]      Sig: DISSOLVE 17 GRAMS IN LIQUID AND DRINK BY MOUTH TWICE DAILY AS NEEDED FOR MODERATE CONSTIPATION     There is no refill protocol information for this order

## 2023-10-11 DIAGNOSIS — Z7901 Long term (current) use of anticoagulants: Secondary | ICD-10-CM | POA: Diagnosis not present

## 2023-10-11 DIAGNOSIS — K5900 Constipation, unspecified: Secondary | ICD-10-CM | POA: Diagnosis not present

## 2023-10-11 DIAGNOSIS — E11319 Type 2 diabetes mellitus with unspecified diabetic retinopathy without macular edema: Secondary | ICD-10-CM | POA: Diagnosis not present

## 2023-10-11 DIAGNOSIS — I272 Pulmonary hypertension, unspecified: Secondary | ICD-10-CM | POA: Diagnosis not present

## 2023-10-11 DIAGNOSIS — Z9181 History of falling: Secondary | ICD-10-CM | POA: Diagnosis not present

## 2023-10-11 DIAGNOSIS — I69354 Hemiplegia and hemiparesis following cerebral infarction affecting left non-dominant side: Secondary | ICD-10-CM | POA: Diagnosis not present

## 2023-10-11 DIAGNOSIS — K219 Gastro-esophageal reflux disease without esophagitis: Secondary | ICD-10-CM | POA: Diagnosis not present

## 2023-10-11 DIAGNOSIS — E7800 Pure hypercholesterolemia, unspecified: Secondary | ICD-10-CM | POA: Diagnosis not present

## 2023-10-11 DIAGNOSIS — I5032 Chronic diastolic (congestive) heart failure: Secondary | ICD-10-CM | POA: Diagnosis not present

## 2023-10-11 DIAGNOSIS — Z794 Long term (current) use of insulin: Secondary | ICD-10-CM | POA: Diagnosis not present

## 2023-10-11 DIAGNOSIS — E1142 Type 2 diabetes mellitus with diabetic polyneuropathy: Secondary | ICD-10-CM | POA: Diagnosis not present

## 2023-10-11 DIAGNOSIS — Z9981 Dependence on supplemental oxygen: Secondary | ICD-10-CM | POA: Diagnosis not present

## 2023-10-11 DIAGNOSIS — Z87440 Personal history of urinary (tract) infections: Secondary | ICD-10-CM | POA: Diagnosis not present

## 2023-10-11 DIAGNOSIS — Z85850 Personal history of malignant neoplasm of thyroid: Secondary | ICD-10-CM | POA: Diagnosis not present

## 2023-10-11 DIAGNOSIS — G4733 Obstructive sleep apnea (adult) (pediatric): Secondary | ICD-10-CM | POA: Diagnosis not present

## 2023-10-11 DIAGNOSIS — I110 Hypertensive heart disease with heart failure: Secondary | ICD-10-CM | POA: Diagnosis not present

## 2023-10-11 DIAGNOSIS — I480 Paroxysmal atrial fibrillation: Secondary | ICD-10-CM | POA: Diagnosis not present

## 2023-10-11 DIAGNOSIS — M1990 Unspecified osteoarthritis, unspecified site: Secondary | ICD-10-CM | POA: Diagnosis not present

## 2023-10-11 DIAGNOSIS — Z79891 Long term (current) use of opiate analgesic: Secondary | ICD-10-CM | POA: Diagnosis not present

## 2023-10-15 ENCOUNTER — Other Ambulatory Visit: Payer: Self-pay

## 2023-10-15 ENCOUNTER — Encounter: Payer: Self-pay | Admitting: Podiatry

## 2023-10-15 ENCOUNTER — Ambulatory Visit (INDEPENDENT_AMBULATORY_CARE_PROVIDER_SITE_OTHER): Payer: Medicare Other | Admitting: Podiatry

## 2023-10-15 ENCOUNTER — Telehealth: Payer: Self-pay | Admitting: Cardiology

## 2023-10-15 DIAGNOSIS — D689 Coagulation defect, unspecified: Secondary | ICD-10-CM | POA: Diagnosis not present

## 2023-10-15 DIAGNOSIS — M79674 Pain in right toe(s): Secondary | ICD-10-CM | POA: Diagnosis not present

## 2023-10-15 DIAGNOSIS — B351 Tinea unguium: Secondary | ICD-10-CM

## 2023-10-15 DIAGNOSIS — E1159 Type 2 diabetes mellitus with other circulatory complications: Secondary | ICD-10-CM

## 2023-10-15 DIAGNOSIS — I480 Paroxysmal atrial fibrillation: Secondary | ICD-10-CM

## 2023-10-15 DIAGNOSIS — M79675 Pain in left toe(s): Secondary | ICD-10-CM

## 2023-10-15 MED ORDER — APIXABAN 5 MG PO TABS: 5.0000 mg | ORAL_TABLET | Freq: Two times a day (BID) | ORAL | 5 refills | Status: AC

## 2023-10-15 NOTE — Telephone Encounter (Signed)
*  STAT* If patient is at the pharmacy, call can be transferred to refill team.   1. Which medications need to be refilled? (please list name of each medication and dose if known) apixaban (ELIQUIS) 5 MG TABS tablet    2. Would you like to learn more about the convenience, safety, & potential cost savings by using the [facility]?     3. Are you open to using the [facility] (Type [facility].  ).   4. Which pharmacy/location (including street and city if local pharmacy) is medication to be sent to? [id number] - [place], [place] - [address] AT [facility] OF [address]    5. Do they need a 30 day or 90 day supply? 30 day  Patient is out of medication

## 2023-10-15 NOTE — Progress Notes (Signed)
This patient returns to my office for at risk foot care.  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  This patient is unable to cut nails herself  since the patient cannot reach her  nails.These nails are painful walking and wearing shoes.  Patient presents in a wheelchair  with her granddaughter..  This patient presents for at risk foot care today.  General Appearance  Alert, conversant and in no acute stress.  Vascular  Dorsalis pedis and posterior tibial  pulses are absent due to swelling. bilaterally.  Capillary return is within normal limits  bilaterally. Temperature is within normal limits  bilaterally.  Neurologic  Senn-Weinstein monofilament wire diminished  bilaterally. Muscle power within normal limits bilaterally.  Nails Thick disfigured discolored nails with subungual debris  from hallux to fifth toes bilaterally. No evidence of bacterial infection or drainage bilaterally.  Orthopedic  No limitations of motion  feet .  No crepitus or effusions noted.  No bony pathology or digital deformities noted.  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  Hallux  malleus  B/L  Skin  normotropic skin with no porokeratosis noted bilaterally.  Skin lesion on dorsum of hallux malleus  B/L.    Onychomycosis  Pain in right toes  Pain in left toes  Consent was obtained for treatment procedures.   Mechanical debridement of nails 1-5  bilaterally performed with a nail nipper.  Filed with dremel without incident.    Return office visit    3 months       Told patient to return for periodic foot care and evaluation due to potential at risk complications.   Helane Gunther DPM

## 2023-10-15 NOTE — Telephone Encounter (Signed)
Prescription refill request for Eliquis received. Indication:afib Last office visit:8/24 Scr:1.08  9/24 Age: 72 Weight:108.9  kg  Prescription refilled

## 2023-10-16 DIAGNOSIS — E7800 Pure hypercholesterolemia, unspecified: Secondary | ICD-10-CM | POA: Diagnosis not present

## 2023-10-16 DIAGNOSIS — Z9981 Dependence on supplemental oxygen: Secondary | ICD-10-CM | POA: Diagnosis not present

## 2023-10-16 DIAGNOSIS — I5032 Chronic diastolic (congestive) heart failure: Secondary | ICD-10-CM | POA: Diagnosis not present

## 2023-10-16 DIAGNOSIS — E1142 Type 2 diabetes mellitus with diabetic polyneuropathy: Secondary | ICD-10-CM | POA: Diagnosis not present

## 2023-10-16 DIAGNOSIS — K219 Gastro-esophageal reflux disease without esophagitis: Secondary | ICD-10-CM | POA: Diagnosis not present

## 2023-10-16 DIAGNOSIS — M1990 Unspecified osteoarthritis, unspecified site: Secondary | ICD-10-CM | POA: Diagnosis not present

## 2023-10-16 DIAGNOSIS — G4733 Obstructive sleep apnea (adult) (pediatric): Secondary | ICD-10-CM | POA: Diagnosis not present

## 2023-10-16 DIAGNOSIS — Z87440 Personal history of urinary (tract) infections: Secondary | ICD-10-CM | POA: Diagnosis not present

## 2023-10-16 DIAGNOSIS — Z794 Long term (current) use of insulin: Secondary | ICD-10-CM | POA: Diagnosis not present

## 2023-10-16 DIAGNOSIS — Z7901 Long term (current) use of anticoagulants: Secondary | ICD-10-CM | POA: Diagnosis not present

## 2023-10-16 DIAGNOSIS — Z79891 Long term (current) use of opiate analgesic: Secondary | ICD-10-CM | POA: Diagnosis not present

## 2023-10-16 DIAGNOSIS — I480 Paroxysmal atrial fibrillation: Secondary | ICD-10-CM | POA: Diagnosis not present

## 2023-10-16 DIAGNOSIS — E11319 Type 2 diabetes mellitus with unspecified diabetic retinopathy without macular edema: Secondary | ICD-10-CM | POA: Diagnosis not present

## 2023-10-16 DIAGNOSIS — Z9181 History of falling: Secondary | ICD-10-CM | POA: Diagnosis not present

## 2023-10-16 DIAGNOSIS — K5900 Constipation, unspecified: Secondary | ICD-10-CM | POA: Diagnosis not present

## 2023-10-16 DIAGNOSIS — I272 Pulmonary hypertension, unspecified: Secondary | ICD-10-CM | POA: Diagnosis not present

## 2023-10-16 DIAGNOSIS — I110 Hypertensive heart disease with heart failure: Secondary | ICD-10-CM | POA: Diagnosis not present

## 2023-10-16 DIAGNOSIS — Z85850 Personal history of malignant neoplasm of thyroid: Secondary | ICD-10-CM | POA: Diagnosis not present

## 2023-10-16 DIAGNOSIS — I69354 Hemiplegia and hemiparesis following cerebral infarction affecting left non-dominant side: Secondary | ICD-10-CM | POA: Diagnosis not present

## 2023-10-17 DIAGNOSIS — I272 Pulmonary hypertension, unspecified: Secondary | ICD-10-CM | POA: Diagnosis not present

## 2023-10-17 DIAGNOSIS — E7800 Pure hypercholesterolemia, unspecified: Secondary | ICD-10-CM | POA: Diagnosis not present

## 2023-10-17 DIAGNOSIS — I69354 Hemiplegia and hemiparesis following cerebral infarction affecting left non-dominant side: Secondary | ICD-10-CM | POA: Diagnosis not present

## 2023-10-17 DIAGNOSIS — Z79891 Long term (current) use of opiate analgesic: Secondary | ICD-10-CM | POA: Diagnosis not present

## 2023-10-17 DIAGNOSIS — Z87440 Personal history of urinary (tract) infections: Secondary | ICD-10-CM | POA: Diagnosis not present

## 2023-10-17 DIAGNOSIS — Z794 Long term (current) use of insulin: Secondary | ICD-10-CM | POA: Diagnosis not present

## 2023-10-17 DIAGNOSIS — Z7901 Long term (current) use of anticoagulants: Secondary | ICD-10-CM | POA: Diagnosis not present

## 2023-10-17 DIAGNOSIS — E11319 Type 2 diabetes mellitus with unspecified diabetic retinopathy without macular edema: Secondary | ICD-10-CM | POA: Diagnosis not present

## 2023-10-17 DIAGNOSIS — Z9981 Dependence on supplemental oxygen: Secondary | ICD-10-CM | POA: Diagnosis not present

## 2023-10-17 DIAGNOSIS — Z85850 Personal history of malignant neoplasm of thyroid: Secondary | ICD-10-CM | POA: Diagnosis not present

## 2023-10-17 DIAGNOSIS — I110 Hypertensive heart disease with heart failure: Secondary | ICD-10-CM | POA: Diagnosis not present

## 2023-10-17 DIAGNOSIS — G4733 Obstructive sleep apnea (adult) (pediatric): Secondary | ICD-10-CM | POA: Diagnosis not present

## 2023-10-17 DIAGNOSIS — Z9181 History of falling: Secondary | ICD-10-CM | POA: Diagnosis not present

## 2023-10-17 DIAGNOSIS — K5900 Constipation, unspecified: Secondary | ICD-10-CM | POA: Diagnosis not present

## 2023-10-17 DIAGNOSIS — E1142 Type 2 diabetes mellitus with diabetic polyneuropathy: Secondary | ICD-10-CM | POA: Diagnosis not present

## 2023-10-17 DIAGNOSIS — I480 Paroxysmal atrial fibrillation: Secondary | ICD-10-CM | POA: Diagnosis not present

## 2023-10-17 DIAGNOSIS — I5032 Chronic diastolic (congestive) heart failure: Secondary | ICD-10-CM | POA: Diagnosis not present

## 2023-10-17 DIAGNOSIS — M1990 Unspecified osteoarthritis, unspecified site: Secondary | ICD-10-CM | POA: Diagnosis not present

## 2023-10-17 DIAGNOSIS — K219 Gastro-esophageal reflux disease without esophagitis: Secondary | ICD-10-CM | POA: Diagnosis not present

## 2023-10-18 NOTE — Progress Notes (Signed)
Remote pacemaker transmission.   

## 2023-10-22 DIAGNOSIS — I69354 Hemiplegia and hemiparesis following cerebral infarction affecting left non-dominant side: Secondary | ICD-10-CM | POA: Diagnosis not present

## 2023-10-22 DIAGNOSIS — Z9181 History of falling: Secondary | ICD-10-CM | POA: Diagnosis not present

## 2023-10-22 DIAGNOSIS — K5900 Constipation, unspecified: Secondary | ICD-10-CM | POA: Diagnosis not present

## 2023-10-22 DIAGNOSIS — M1990 Unspecified osteoarthritis, unspecified site: Secondary | ICD-10-CM | POA: Diagnosis not present

## 2023-10-22 DIAGNOSIS — I272 Pulmonary hypertension, unspecified: Secondary | ICD-10-CM | POA: Diagnosis not present

## 2023-10-22 DIAGNOSIS — K219 Gastro-esophageal reflux disease without esophagitis: Secondary | ICD-10-CM | POA: Diagnosis not present

## 2023-10-22 DIAGNOSIS — Z85850 Personal history of malignant neoplasm of thyroid: Secondary | ICD-10-CM | POA: Diagnosis not present

## 2023-10-22 DIAGNOSIS — I5032 Chronic diastolic (congestive) heart failure: Secondary | ICD-10-CM | POA: Diagnosis not present

## 2023-10-22 DIAGNOSIS — Z79891 Long term (current) use of opiate analgesic: Secondary | ICD-10-CM | POA: Diagnosis not present

## 2023-10-22 DIAGNOSIS — E1142 Type 2 diabetes mellitus with diabetic polyneuropathy: Secondary | ICD-10-CM | POA: Diagnosis not present

## 2023-10-22 DIAGNOSIS — Z7901 Long term (current) use of anticoagulants: Secondary | ICD-10-CM | POA: Diagnosis not present

## 2023-10-22 DIAGNOSIS — Z9981 Dependence on supplemental oxygen: Secondary | ICD-10-CM | POA: Diagnosis not present

## 2023-10-22 DIAGNOSIS — E7800 Pure hypercholesterolemia, unspecified: Secondary | ICD-10-CM | POA: Diagnosis not present

## 2023-10-22 DIAGNOSIS — I110 Hypertensive heart disease with heart failure: Secondary | ICD-10-CM | POA: Diagnosis not present

## 2023-10-22 DIAGNOSIS — Z794 Long term (current) use of insulin: Secondary | ICD-10-CM | POA: Diagnosis not present

## 2023-10-22 DIAGNOSIS — G4733 Obstructive sleep apnea (adult) (pediatric): Secondary | ICD-10-CM | POA: Diagnosis not present

## 2023-10-22 DIAGNOSIS — Z87440 Personal history of urinary (tract) infections: Secondary | ICD-10-CM | POA: Diagnosis not present

## 2023-10-22 DIAGNOSIS — I480 Paroxysmal atrial fibrillation: Secondary | ICD-10-CM | POA: Diagnosis not present

## 2023-10-22 DIAGNOSIS — E11319 Type 2 diabetes mellitus with unspecified diabetic retinopathy without macular edema: Secondary | ICD-10-CM | POA: Diagnosis not present

## 2023-10-23 DIAGNOSIS — G4733 Obstructive sleep apnea (adult) (pediatric): Secondary | ICD-10-CM | POA: Diagnosis not present

## 2023-10-23 DIAGNOSIS — I110 Hypertensive heart disease with heart failure: Secondary | ICD-10-CM | POA: Diagnosis not present

## 2023-10-23 DIAGNOSIS — Z7901 Long term (current) use of anticoagulants: Secondary | ICD-10-CM | POA: Diagnosis not present

## 2023-10-23 DIAGNOSIS — Z85850 Personal history of malignant neoplasm of thyroid: Secondary | ICD-10-CM | POA: Diagnosis not present

## 2023-10-23 DIAGNOSIS — E7800 Pure hypercholesterolemia, unspecified: Secondary | ICD-10-CM | POA: Diagnosis not present

## 2023-10-23 DIAGNOSIS — Z87440 Personal history of urinary (tract) infections: Secondary | ICD-10-CM | POA: Diagnosis not present

## 2023-10-23 DIAGNOSIS — Z9181 History of falling: Secondary | ICD-10-CM | POA: Diagnosis not present

## 2023-10-23 DIAGNOSIS — I69354 Hemiplegia and hemiparesis following cerebral infarction affecting left non-dominant side: Secondary | ICD-10-CM | POA: Diagnosis not present

## 2023-10-23 DIAGNOSIS — K219 Gastro-esophageal reflux disease without esophagitis: Secondary | ICD-10-CM | POA: Diagnosis not present

## 2023-10-23 DIAGNOSIS — M1990 Unspecified osteoarthritis, unspecified site: Secondary | ICD-10-CM | POA: Diagnosis not present

## 2023-10-23 DIAGNOSIS — E1142 Type 2 diabetes mellitus with diabetic polyneuropathy: Secondary | ICD-10-CM | POA: Diagnosis not present

## 2023-10-23 DIAGNOSIS — I272 Pulmonary hypertension, unspecified: Secondary | ICD-10-CM | POA: Diagnosis not present

## 2023-10-23 DIAGNOSIS — Z794 Long term (current) use of insulin: Secondary | ICD-10-CM | POA: Diagnosis not present

## 2023-10-23 DIAGNOSIS — Z79891 Long term (current) use of opiate analgesic: Secondary | ICD-10-CM | POA: Diagnosis not present

## 2023-10-23 DIAGNOSIS — I480 Paroxysmal atrial fibrillation: Secondary | ICD-10-CM | POA: Diagnosis not present

## 2023-10-23 DIAGNOSIS — I5032 Chronic diastolic (congestive) heart failure: Secondary | ICD-10-CM | POA: Diagnosis not present

## 2023-10-23 DIAGNOSIS — K5900 Constipation, unspecified: Secondary | ICD-10-CM | POA: Diagnosis not present

## 2023-10-23 DIAGNOSIS — Z9981 Dependence on supplemental oxygen: Secondary | ICD-10-CM | POA: Diagnosis not present

## 2023-10-23 DIAGNOSIS — E11319 Type 2 diabetes mellitus with unspecified diabetic retinopathy without macular edema: Secondary | ICD-10-CM | POA: Diagnosis not present

## 2023-10-25 ENCOUNTER — Other Ambulatory Visit: Payer: Self-pay | Admitting: Internal Medicine

## 2023-10-29 DIAGNOSIS — K219 Gastro-esophageal reflux disease without esophagitis: Secondary | ICD-10-CM | POA: Diagnosis not present

## 2023-10-29 DIAGNOSIS — I69354 Hemiplegia and hemiparesis following cerebral infarction affecting left non-dominant side: Secondary | ICD-10-CM | POA: Diagnosis not present

## 2023-10-29 DIAGNOSIS — Z794 Long term (current) use of insulin: Secondary | ICD-10-CM | POA: Diagnosis not present

## 2023-10-29 DIAGNOSIS — K5900 Constipation, unspecified: Secondary | ICD-10-CM | POA: Diagnosis not present

## 2023-10-29 DIAGNOSIS — Z85850 Personal history of malignant neoplasm of thyroid: Secondary | ICD-10-CM | POA: Diagnosis not present

## 2023-10-29 DIAGNOSIS — Z79891 Long term (current) use of opiate analgesic: Secondary | ICD-10-CM | POA: Diagnosis not present

## 2023-10-29 DIAGNOSIS — I272 Pulmonary hypertension, unspecified: Secondary | ICD-10-CM | POA: Diagnosis not present

## 2023-10-29 DIAGNOSIS — E7800 Pure hypercholesterolemia, unspecified: Secondary | ICD-10-CM | POA: Diagnosis not present

## 2023-10-29 DIAGNOSIS — E1142 Type 2 diabetes mellitus with diabetic polyneuropathy: Secondary | ICD-10-CM | POA: Diagnosis not present

## 2023-10-29 DIAGNOSIS — Z9981 Dependence on supplemental oxygen: Secondary | ICD-10-CM | POA: Diagnosis not present

## 2023-10-29 DIAGNOSIS — I5032 Chronic diastolic (congestive) heart failure: Secondary | ICD-10-CM | POA: Diagnosis not present

## 2023-10-29 DIAGNOSIS — I480 Paroxysmal atrial fibrillation: Secondary | ICD-10-CM | POA: Diagnosis not present

## 2023-10-29 DIAGNOSIS — Z87440 Personal history of urinary (tract) infections: Secondary | ICD-10-CM | POA: Diagnosis not present

## 2023-10-29 DIAGNOSIS — Z9181 History of falling: Secondary | ICD-10-CM | POA: Diagnosis not present

## 2023-10-29 DIAGNOSIS — M1990 Unspecified osteoarthritis, unspecified site: Secondary | ICD-10-CM | POA: Diagnosis not present

## 2023-10-29 DIAGNOSIS — E11319 Type 2 diabetes mellitus with unspecified diabetic retinopathy without macular edema: Secondary | ICD-10-CM | POA: Diagnosis not present

## 2023-10-29 DIAGNOSIS — G4733 Obstructive sleep apnea (adult) (pediatric): Secondary | ICD-10-CM | POA: Diagnosis not present

## 2023-10-29 DIAGNOSIS — I110 Hypertensive heart disease with heart failure: Secondary | ICD-10-CM | POA: Diagnosis not present

## 2023-10-29 DIAGNOSIS — Z7901 Long term (current) use of anticoagulants: Secondary | ICD-10-CM | POA: Diagnosis not present

## 2023-10-30 ENCOUNTER — Other Ambulatory Visit: Payer: Self-pay | Admitting: Pharmacist

## 2023-10-30 DIAGNOSIS — I10 Essential (primary) hypertension: Secondary | ICD-10-CM

## 2023-10-30 MED ORDER — LOSARTAN POTASSIUM 25 MG PO TABS: 25.0000 mg | ORAL_TABLET | Freq: Every day | ORAL | 1 refills | Status: DC

## 2023-11-05 ENCOUNTER — Other Ambulatory Visit: Payer: Self-pay | Admitting: Student

## 2023-11-06 DIAGNOSIS — Z9981 Dependence on supplemental oxygen: Secondary | ICD-10-CM | POA: Diagnosis not present

## 2023-11-06 DIAGNOSIS — Z85850 Personal history of malignant neoplasm of thyroid: Secondary | ICD-10-CM | POA: Diagnosis not present

## 2023-11-06 DIAGNOSIS — E1142 Type 2 diabetes mellitus with diabetic polyneuropathy: Secondary | ICD-10-CM | POA: Diagnosis not present

## 2023-11-06 DIAGNOSIS — Z87440 Personal history of urinary (tract) infections: Secondary | ICD-10-CM | POA: Diagnosis not present

## 2023-11-06 DIAGNOSIS — E7800 Pure hypercholesterolemia, unspecified: Secondary | ICD-10-CM | POA: Diagnosis not present

## 2023-11-06 DIAGNOSIS — I110 Hypertensive heart disease with heart failure: Secondary | ICD-10-CM | POA: Diagnosis not present

## 2023-11-06 DIAGNOSIS — Z794 Long term (current) use of insulin: Secondary | ICD-10-CM | POA: Diagnosis not present

## 2023-11-06 DIAGNOSIS — I69354 Hemiplegia and hemiparesis following cerebral infarction affecting left non-dominant side: Secondary | ICD-10-CM | POA: Diagnosis not present

## 2023-11-06 DIAGNOSIS — K5900 Constipation, unspecified: Secondary | ICD-10-CM | POA: Diagnosis not present

## 2023-11-06 DIAGNOSIS — M1990 Unspecified osteoarthritis, unspecified site: Secondary | ICD-10-CM | POA: Diagnosis not present

## 2023-11-06 DIAGNOSIS — Z9181 History of falling: Secondary | ICD-10-CM | POA: Diagnosis not present

## 2023-11-06 DIAGNOSIS — I480 Paroxysmal atrial fibrillation: Secondary | ICD-10-CM | POA: Diagnosis not present

## 2023-11-06 DIAGNOSIS — E11319 Type 2 diabetes mellitus with unspecified diabetic retinopathy without macular edema: Secondary | ICD-10-CM | POA: Diagnosis not present

## 2023-11-06 DIAGNOSIS — G4733 Obstructive sleep apnea (adult) (pediatric): Secondary | ICD-10-CM | POA: Diagnosis not present

## 2023-11-06 DIAGNOSIS — K219 Gastro-esophageal reflux disease without esophagitis: Secondary | ICD-10-CM | POA: Diagnosis not present

## 2023-11-06 DIAGNOSIS — I5032 Chronic diastolic (congestive) heart failure: Secondary | ICD-10-CM | POA: Diagnosis not present

## 2023-11-06 DIAGNOSIS — Z79891 Long term (current) use of opiate analgesic: Secondary | ICD-10-CM | POA: Diagnosis not present

## 2023-11-06 DIAGNOSIS — I272 Pulmonary hypertension, unspecified: Secondary | ICD-10-CM | POA: Diagnosis not present

## 2023-11-06 DIAGNOSIS — Z7901 Long term (current) use of anticoagulants: Secondary | ICD-10-CM | POA: Diagnosis not present

## 2023-11-09 DIAGNOSIS — K219 Gastro-esophageal reflux disease without esophagitis: Secondary | ICD-10-CM | POA: Diagnosis not present

## 2023-11-09 DIAGNOSIS — I110 Hypertensive heart disease with heart failure: Secondary | ICD-10-CM | POA: Diagnosis not present

## 2023-11-09 DIAGNOSIS — I480 Paroxysmal atrial fibrillation: Secondary | ICD-10-CM | POA: Diagnosis not present

## 2023-11-09 DIAGNOSIS — K5900 Constipation, unspecified: Secondary | ICD-10-CM | POA: Diagnosis not present

## 2023-11-09 DIAGNOSIS — Z7901 Long term (current) use of anticoagulants: Secondary | ICD-10-CM | POA: Diagnosis not present

## 2023-11-09 DIAGNOSIS — I69354 Hemiplegia and hemiparesis following cerebral infarction affecting left non-dominant side: Secondary | ICD-10-CM | POA: Diagnosis not present

## 2023-11-09 DIAGNOSIS — E11319 Type 2 diabetes mellitus with unspecified diabetic retinopathy without macular edema: Secondary | ICD-10-CM | POA: Diagnosis not present

## 2023-11-09 DIAGNOSIS — Z79891 Long term (current) use of opiate analgesic: Secondary | ICD-10-CM | POA: Diagnosis not present

## 2023-11-09 DIAGNOSIS — E1142 Type 2 diabetes mellitus with diabetic polyneuropathy: Secondary | ICD-10-CM | POA: Diagnosis not present

## 2023-11-09 DIAGNOSIS — G4733 Obstructive sleep apnea (adult) (pediatric): Secondary | ICD-10-CM | POA: Diagnosis not present

## 2023-11-09 DIAGNOSIS — E7800 Pure hypercholesterolemia, unspecified: Secondary | ICD-10-CM | POA: Diagnosis not present

## 2023-11-09 DIAGNOSIS — I5032 Chronic diastolic (congestive) heart failure: Secondary | ICD-10-CM | POA: Diagnosis not present

## 2023-11-09 DIAGNOSIS — I272 Pulmonary hypertension, unspecified: Secondary | ICD-10-CM | POA: Diagnosis not present

## 2023-11-09 DIAGNOSIS — Z9181 History of falling: Secondary | ICD-10-CM | POA: Diagnosis not present

## 2023-11-09 DIAGNOSIS — Z9981 Dependence on supplemental oxygen: Secondary | ICD-10-CM | POA: Diagnosis not present

## 2023-11-09 DIAGNOSIS — Z87440 Personal history of urinary (tract) infections: Secondary | ICD-10-CM | POA: Diagnosis not present

## 2023-11-09 DIAGNOSIS — M1990 Unspecified osteoarthritis, unspecified site: Secondary | ICD-10-CM | POA: Diagnosis not present

## 2023-11-09 DIAGNOSIS — Z794 Long term (current) use of insulin: Secondary | ICD-10-CM | POA: Diagnosis not present

## 2023-11-09 DIAGNOSIS — Z85850 Personal history of malignant neoplasm of thyroid: Secondary | ICD-10-CM | POA: Diagnosis not present

## 2023-11-13 ENCOUNTER — Other Ambulatory Visit: Payer: Self-pay | Admitting: Pulmonary Disease

## 2023-11-13 DIAGNOSIS — Z23 Encounter for immunization: Secondary | ICD-10-CM | POA: Diagnosis not present

## 2023-11-13 DIAGNOSIS — R7989 Other specified abnormal findings of blood chemistry: Secondary | ICD-10-CM | POA: Diagnosis not present

## 2023-11-13 DIAGNOSIS — R945 Abnormal results of liver function studies: Secondary | ICD-10-CM | POA: Diagnosis not present

## 2023-11-14 DIAGNOSIS — K5900 Constipation, unspecified: Secondary | ICD-10-CM | POA: Diagnosis not present

## 2023-11-14 DIAGNOSIS — Z9981 Dependence on supplemental oxygen: Secondary | ICD-10-CM | POA: Diagnosis not present

## 2023-11-14 DIAGNOSIS — K219 Gastro-esophageal reflux disease without esophagitis: Secondary | ICD-10-CM | POA: Diagnosis not present

## 2023-11-14 DIAGNOSIS — Z9181 History of falling: Secondary | ICD-10-CM | POA: Diagnosis not present

## 2023-11-14 DIAGNOSIS — I480 Paroxysmal atrial fibrillation: Secondary | ICD-10-CM | POA: Diagnosis not present

## 2023-11-14 DIAGNOSIS — G4733 Obstructive sleep apnea (adult) (pediatric): Secondary | ICD-10-CM | POA: Diagnosis not present

## 2023-11-14 DIAGNOSIS — E1142 Type 2 diabetes mellitus with diabetic polyneuropathy: Secondary | ICD-10-CM | POA: Diagnosis not present

## 2023-11-14 DIAGNOSIS — Z794 Long term (current) use of insulin: Secondary | ICD-10-CM | POA: Diagnosis not present

## 2023-11-14 DIAGNOSIS — M1990 Unspecified osteoarthritis, unspecified site: Secondary | ICD-10-CM | POA: Diagnosis not present

## 2023-11-14 DIAGNOSIS — Z85850 Personal history of malignant neoplasm of thyroid: Secondary | ICD-10-CM | POA: Diagnosis not present

## 2023-11-14 DIAGNOSIS — I272 Pulmonary hypertension, unspecified: Secondary | ICD-10-CM | POA: Diagnosis not present

## 2023-11-14 DIAGNOSIS — E11319 Type 2 diabetes mellitus with unspecified diabetic retinopathy without macular edema: Secondary | ICD-10-CM | POA: Diagnosis not present

## 2023-11-14 DIAGNOSIS — I69354 Hemiplegia and hemiparesis following cerebral infarction affecting left non-dominant side: Secondary | ICD-10-CM | POA: Diagnosis not present

## 2023-11-14 DIAGNOSIS — Z7901 Long term (current) use of anticoagulants: Secondary | ICD-10-CM | POA: Diagnosis not present

## 2023-11-14 DIAGNOSIS — Z87440 Personal history of urinary (tract) infections: Secondary | ICD-10-CM | POA: Diagnosis not present

## 2023-11-14 DIAGNOSIS — Z79891 Long term (current) use of opiate analgesic: Secondary | ICD-10-CM | POA: Diagnosis not present

## 2023-11-14 DIAGNOSIS — I110 Hypertensive heart disease with heart failure: Secondary | ICD-10-CM | POA: Diagnosis not present

## 2023-11-14 DIAGNOSIS — E7800 Pure hypercholesterolemia, unspecified: Secondary | ICD-10-CM | POA: Diagnosis not present

## 2023-11-14 DIAGNOSIS — I5032 Chronic diastolic (congestive) heart failure: Secondary | ICD-10-CM | POA: Diagnosis not present

## 2023-11-17 ENCOUNTER — Observation Stay (HOSPITAL_COMMUNITY)
Admission: EM | Admit: 2023-11-17 | Discharge: 2023-11-20 | Disposition: A | Discharge status: Home Health | Payer: Medicare Other | Attending: Family Medicine | Admitting: Family Medicine

## 2023-11-17 ENCOUNTER — Other Ambulatory Visit: Payer: Self-pay

## 2023-11-17 ENCOUNTER — Emergency Department (HOSPITAL_COMMUNITY): Payer: Medicare Other

## 2023-11-17 ENCOUNTER — Encounter (HOSPITAL_COMMUNITY): Payer: Self-pay

## 2023-11-17 DIAGNOSIS — Z85850 Personal history of malignant neoplasm of thyroid: Secondary | ICD-10-CM | POA: Diagnosis not present

## 2023-11-17 DIAGNOSIS — Z7409 Other reduced mobility: Secondary | ICD-10-CM | POA: Diagnosis not present

## 2023-11-17 DIAGNOSIS — Z91040 Latex allergy status: Secondary | ICD-10-CM | POA: Diagnosis not present

## 2023-11-17 DIAGNOSIS — K219 Gastro-esophageal reflux disease without esophagitis: Secondary | ICD-10-CM | POA: Diagnosis present

## 2023-11-17 DIAGNOSIS — Z993 Dependence on wheelchair: Secondary | ICD-10-CM | POA: Diagnosis not present

## 2023-11-17 DIAGNOSIS — E876 Hypokalemia: Secondary | ICD-10-CM | POA: Diagnosis not present

## 2023-11-17 DIAGNOSIS — R519 Headache, unspecified: Secondary | ICD-10-CM | POA: Diagnosis not present

## 2023-11-17 DIAGNOSIS — Z8673 Personal history of transient ischemic attack (TIA), and cerebral infarction without residual deficits: Secondary | ICD-10-CM | POA: Diagnosis not present

## 2023-11-17 DIAGNOSIS — J9610 Chronic respiratory failure, unspecified whether with hypoxia or hypercapnia: Secondary | ICD-10-CM | POA: Insufficient documentation

## 2023-11-17 DIAGNOSIS — R55 Syncope and collapse: Secondary | ICD-10-CM | POA: Diagnosis not present

## 2023-11-17 DIAGNOSIS — Z9981 Dependence on supplemental oxygen: Secondary | ICD-10-CM | POA: Diagnosis not present

## 2023-11-17 DIAGNOSIS — E10649 Type 1 diabetes mellitus with hypoglycemia without coma: Principal | ICD-10-CM | POA: Insufficient documentation

## 2023-11-17 DIAGNOSIS — R918 Other nonspecific abnormal finding of lung field: Secondary | ICD-10-CM | POA: Diagnosis not present

## 2023-11-17 DIAGNOSIS — E161 Other hypoglycemia: Secondary | ICD-10-CM | POA: Diagnosis not present

## 2023-11-17 DIAGNOSIS — J9621 Acute and chronic respiratory failure with hypoxia: Secondary | ICD-10-CM | POA: Diagnosis not present

## 2023-11-17 DIAGNOSIS — I110 Hypertensive heart disease with heart failure: Secondary | ICD-10-CM | POA: Diagnosis not present

## 2023-11-17 DIAGNOSIS — R0989 Other specified symptoms and signs involving the circulatory and respiratory systems: Secondary | ICD-10-CM | POA: Diagnosis not present

## 2023-11-17 DIAGNOSIS — D696 Thrombocytopenia, unspecified: Secondary | ICD-10-CM | POA: Diagnosis not present

## 2023-11-17 DIAGNOSIS — Z950 Presence of cardiac pacemaker: Secondary | ICD-10-CM | POA: Diagnosis present

## 2023-11-17 DIAGNOSIS — G8929 Other chronic pain: Secondary | ICD-10-CM

## 2023-11-17 DIAGNOSIS — E162 Hypoglycemia, unspecified: Secondary | ICD-10-CM | POA: Diagnosis not present

## 2023-11-17 DIAGNOSIS — Z7951 Long term (current) use of inhaled steroids: Secondary | ICD-10-CM | POA: Diagnosis not present

## 2023-11-17 DIAGNOSIS — Z79899 Other long term (current) drug therapy: Secondary | ICD-10-CM | POA: Diagnosis not present

## 2023-11-17 DIAGNOSIS — Z794 Long term (current) use of insulin: Secondary | ICD-10-CM | POA: Insufficient documentation

## 2023-11-17 DIAGNOSIS — M545 Low back pain, unspecified: Secondary | ICD-10-CM | POA: Diagnosis present

## 2023-11-17 DIAGNOSIS — L89610 Pressure ulcer of right heel, unstageable: Secondary | ICD-10-CM | POA: Insufficient documentation

## 2023-11-17 DIAGNOSIS — I480 Paroxysmal atrial fibrillation: Secondary | ICD-10-CM | POA: Diagnosis not present

## 2023-11-17 DIAGNOSIS — I10 Essential (primary) hypertension: Secondary | ICD-10-CM | POA: Diagnosis present

## 2023-11-17 DIAGNOSIS — I69354 Hemiplegia and hemiparesis following cerebral infarction affecting left non-dominant side: Secondary | ICD-10-CM

## 2023-11-17 DIAGNOSIS — E66813 Obesity, class 3: Secondary | ICD-10-CM | POA: Diagnosis present

## 2023-11-17 DIAGNOSIS — G4733 Obstructive sleep apnea (adult) (pediatric): Secondary | ICD-10-CM | POA: Diagnosis present

## 2023-11-17 DIAGNOSIS — E662 Morbid (severe) obesity with alveolar hypoventilation: Secondary | ICD-10-CM | POA: Insufficient documentation

## 2023-11-17 DIAGNOSIS — I5032 Chronic diastolic (congestive) heart failure: Secondary | ICD-10-CM | POA: Insufficient documentation

## 2023-11-17 DIAGNOSIS — E11649 Type 2 diabetes mellitus with hypoglycemia without coma: Secondary | ICD-10-CM | POA: Insufficient documentation

## 2023-11-17 DIAGNOSIS — R6889 Other general symptoms and signs: Secondary | ICD-10-CM | POA: Diagnosis not present

## 2023-11-17 DIAGNOSIS — R404 Transient alteration of awareness: Secondary | ICD-10-CM | POA: Diagnosis not present

## 2023-11-17 DIAGNOSIS — E890 Postprocedural hypothyroidism: Secondary | ICD-10-CM | POA: Diagnosis present

## 2023-11-17 DIAGNOSIS — G3184 Mild cognitive impairment, so stated: Secondary | ICD-10-CM | POA: Diagnosis present

## 2023-11-17 DIAGNOSIS — R1310 Dysphagia, unspecified: Secondary | ICD-10-CM | POA: Diagnosis not present

## 2023-11-17 DIAGNOSIS — I442 Atrioventricular block, complete: Secondary | ICD-10-CM | POA: Diagnosis present

## 2023-11-17 DIAGNOSIS — Z743 Need for continuous supervision: Secondary | ICD-10-CM | POA: Diagnosis not present

## 2023-11-17 DIAGNOSIS — Z9641 Presence of insulin pump (external) (internal): Secondary | ICD-10-CM

## 2023-11-17 DIAGNOSIS — Z7901 Long term (current) use of anticoagulants: Secondary | ICD-10-CM | POA: Insufficient documentation

## 2023-11-17 DIAGNOSIS — J9611 Chronic respiratory failure with hypoxia: Secondary | ICD-10-CM | POA: Insufficient documentation

## 2023-11-17 DIAGNOSIS — E1059 Type 1 diabetes mellitus with other circulatory complications: Secondary | ICD-10-CM | POA: Diagnosis present

## 2023-11-17 DIAGNOSIS — R042 Hemoptysis: Secondary | ICD-10-CM | POA: Insufficient documentation

## 2023-11-17 DIAGNOSIS — F32A Depression, unspecified: Secondary | ICD-10-CM | POA: Diagnosis present

## 2023-11-17 DIAGNOSIS — I6782 Cerebral ischemia: Secondary | ICD-10-CM | POA: Diagnosis not present

## 2023-11-17 LAB — I-STAT CHEM 8, ED
BUN: 22 mg/dL (ref 8–23)
Calcium, Ion: 1.05 mmol/L — ABNORMAL LOW (ref 1.15–1.40)
Chloride: 105 mmol/L (ref 98–111)
Creatinine, Ser: 1 mg/dL (ref 0.44–1.00)
Glucose, Bld: 78 mg/dL (ref 70–99)
HCT: 34 % — ABNORMAL LOW (ref 36.0–46.0)
Hemoglobin: 11.6 g/dL — ABNORMAL LOW (ref 12.0–15.0)
Potassium: 3.9 mmol/L (ref 3.5–5.1)
Sodium: 142 mmol/L (ref 135–145)
TCO2: 28 mmol/L (ref 22–32)

## 2023-11-17 LAB — I-STAT CG4 LACTIC ACID, ED
Lactic Acid, Venous: 1.5 mmol/L (ref 0.5–1.9)
Lactic Acid, Venous: 1.9 mmol/L (ref 0.5–1.9)

## 2023-11-17 LAB — GLUCOSE, CAPILLARY
Glucose-Capillary: 109 mg/dL — ABNORMAL HIGH (ref 70–99)
Glucose-Capillary: 199 mg/dL — ABNORMAL HIGH (ref 70–99)
Glucose-Capillary: 204 mg/dL — ABNORMAL HIGH (ref 70–99)
Glucose-Capillary: 226 mg/dL — ABNORMAL HIGH (ref 70–99)

## 2023-11-17 LAB — CBC WITH DIFFERENTIAL/PLATELET
Abs Immature Granulocytes: 0.04 10*3/uL (ref 0.00–0.07)
Basophils Absolute: 0 10*3/uL (ref 0.0–0.1)
Basophils Relative: 0 %
Eosinophils Absolute: 0 10*3/uL (ref 0.0–0.5)
Eosinophils Relative: 0 %
HCT: 34.1 % — ABNORMAL LOW (ref 36.0–46.0)
Hemoglobin: 10.3 g/dL — ABNORMAL LOW (ref 12.0–15.0)
Immature Granulocytes: 1 %
Lymphocytes Relative: 8 %
Lymphs Abs: 0.7 10*3/uL (ref 0.7–4.0)
MCH: 29 pg (ref 26.0–34.0)
MCHC: 30.2 g/dL (ref 30.0–36.0)
MCV: 96.1 fL (ref 80.0–100.0)
Monocytes Absolute: 0.4 10*3/uL (ref 0.1–1.0)
Monocytes Relative: 4 %
Neutro Abs: 7.8 10*3/uL — ABNORMAL HIGH (ref 1.7–7.7)
Neutrophils Relative %: 87 %
Platelets: 143 10*3/uL — ABNORMAL LOW (ref 150–400)
RBC: 3.55 MIL/uL — ABNORMAL LOW (ref 3.87–5.11)
RDW: 13 % (ref 11.5–15.5)
WBC: 8.9 10*3/uL (ref 4.0–10.5)
nRBC: 0 % (ref 0.0–0.2)

## 2023-11-17 LAB — URINALYSIS, W/ REFLEX TO CULTURE (INFECTION SUSPECTED)
Bacteria, UA: NONE SEEN
Bilirubin Urine: NEGATIVE
Glucose, UA: NEGATIVE mg/dL
Hgb urine dipstick: NEGATIVE
Ketones, ur: NEGATIVE mg/dL
Leukocytes,Ua: NEGATIVE
Nitrite: NEGATIVE
Protein, ur: NEGATIVE mg/dL
Specific Gravity, Urine: 1.016 (ref 1.005–1.030)
pH: 5 (ref 5.0–8.0)

## 2023-11-17 LAB — COMPREHENSIVE METABOLIC PANEL
ALT: 29 U/L (ref 0–44)
AST: 26 U/L (ref 15–41)
Albumin: 3 g/dL — ABNORMAL LOW (ref 3.5–5.0)
Alkaline Phosphatase: 71 U/L (ref 38–126)
Anion gap: 12 (ref 5–15)
BUN: 15 mg/dL (ref 8–23)
CO2: 24 mmol/L (ref 22–32)
Calcium: 8.8 mg/dL — ABNORMAL LOW (ref 8.9–10.3)
Chloride: 104 mmol/L (ref 98–111)
Creatinine, Ser: 0.95 mg/dL (ref 0.44–1.00)
GFR, Estimated: >60 mL/min (ref >60)
Glucose, Bld: 81 mg/dL (ref 70–99)
Potassium: 2.9 mmol/L — ABNORMAL LOW (ref 3.5–5.1)
Sodium: 140 mmol/L (ref 135–145)
Total Bilirubin: 0.5 mg/dL (ref <1.2)
Total Protein: 6.6 g/dL (ref 6.5–8.1)

## 2023-11-17 LAB — MAGNESIUM: Magnesium: 1.9 mg/dL (ref 1.7–2.4)

## 2023-11-17 LAB — CBG MONITORING, ED
Glucose-Capillary: 105 mg/dL — ABNORMAL HIGH (ref 70–99)
Glucose-Capillary: 78 mg/dL (ref 70–99)
Glucose-Capillary: 93 mg/dL (ref 70–99)
Glucose-Capillary: 110 mg/dL — ABNORMAL HIGH (ref 70–99)

## 2023-11-17 LAB — TROPONIN I (HIGH SENSITIVITY)
Troponin I (High Sensitivity): 68 ng/L — ABNORMAL HIGH (ref <18)
Troponin I (High Sensitivity): 119 ng/L (ref <18)

## 2023-11-17 LAB — PROTIME-INR
INR: 1.4 — ABNORMAL HIGH (ref 0.8–1.2)
Prothrombin Time: 17.2 s — ABNORMAL HIGH (ref 11.4–15.2)

## 2023-11-17 LAB — BRAIN NATRIURETIC PEPTIDE: B Natriuretic Peptide: 212.5 pg/mL — ABNORMAL HIGH (ref 0.0–100.0)

## 2023-11-17 MED ORDER — SODIUM CHLORIDE 0.9 % IV SOLN
500.0000 mg | Freq: Once | INTRAVENOUS | Status: AC
  Administered 2023-11-17: 500 mg via INTRAVENOUS
  Filled 2023-11-17: qty 5

## 2023-11-17 MED ORDER — METOPROLOL SUCCINATE ER 50 MG PO TB24
50.0000 mg | ORAL_TABLET | Freq: Every day | ORAL | Status: DC
  Administered 2023-11-19: 50 mg via ORAL
  Filled 2023-11-17 (×2): qty 1

## 2023-11-17 MED ORDER — FUROSEMIDE 10 MG/ML IJ SOLN
40.0000 mg | Freq: Once | INTRAMUSCULAR | Status: AC
  Administered 2023-11-17: 40 mg via INTRAVENOUS
  Filled 2023-11-17: qty 4

## 2023-11-17 MED ORDER — FUROSEMIDE 10 MG/ML IJ SOLN: 20.0000 mg | Freq: Once | INTRAMUSCULAR | Status: DC

## 2023-11-17 MED ORDER — KCL IN DEXTROSE-NACL 20-5-0.9 MEQ/L-%-% IV SOLN
INTRAVENOUS | Status: DC
  Filled 2023-11-17: qty 1000

## 2023-11-17 MED ORDER — APIXABAN 5 MG PO TABS
5.0000 mg | ORAL_TABLET | Freq: Two times a day (BID) | ORAL | Status: DC
Start: 2023-11-17 — End: 2023-11-20
  Administered 2023-11-17 – 2023-11-20 (×5): 5 mg via ORAL
  Filled 2023-11-17 (×6): qty 1

## 2023-11-17 MED ORDER — SODIUM CHLORIDE 0.9 % IV SOLN
1.0000 g | Freq: Once | INTRAVENOUS | Status: AC
  Administered 2023-11-17: 1 g via INTRAVENOUS
  Filled 2023-11-17: qty 10

## 2023-11-17 MED ORDER — LEVOTHYROXINE SODIUM 100 MCG PO TABS
175.0000 ug | ORAL_TABLET | Freq: Every day | ORAL | Status: DC
  Administered 2023-11-18 – 2023-11-20 (×3): 175 ug via ORAL
  Filled 2023-11-17 (×3): qty 1

## 2023-11-17 MED ORDER — ACETAMINOPHEN 650 MG RE SUPP: 650.0000 mg | Freq: Four times a day (QID) | RECTAL | Status: DC | PRN

## 2023-11-17 MED ORDER — SORBITOL 70 % SOLN
30.0000 mL | Freq: Every day | Status: DC | PRN
Start: 2023-11-17 — End: 2023-11-20
  Filled 2023-11-17: qty 30

## 2023-11-17 MED ORDER — POLYETHYLENE GLYCOL 3350 17 G PO PACK
17.0000 g | PACK | Freq: Every day | ORAL | Status: DC | PRN
Start: 2023-11-17 — End: 2023-11-20
  Administered 2023-11-18: 17 g via ORAL
  Filled 2023-11-17: qty 1

## 2023-11-17 MED ORDER — POTASSIUM CHLORIDE CRYS ER 20 MEQ PO TBCR
40.0000 meq | EXTENDED_RELEASE_TABLET | Freq: Once | ORAL | Status: AC
  Administered 2023-11-17: 40 meq via ORAL
  Filled 2023-11-17: qty 2

## 2023-11-17 MED ORDER — SODIUM CHLORIDE 0.9% FLUSH
3.0000 mL | Freq: Two times a day (BID) | INTRAVENOUS | Status: DC
  Administered 2023-11-17 – 2023-11-20 (×7): 3 mL via INTRAVENOUS

## 2023-11-17 MED ORDER — ACETAMINOPHEN 325 MG PO TABS
650.0000 mg | ORAL_TABLET | Freq: Four times a day (QID) | ORAL | Status: DC | PRN
  Administered 2023-11-17 – 2023-11-20 (×5): 650 mg via ORAL
  Filled 2023-11-17 (×5): qty 2

## 2023-11-17 MED ORDER — PANTOPRAZOLE SODIUM 40 MG PO TBEC
40.0000 mg | DELAYED_RELEASE_TABLET | Freq: Two times a day (BID) | ORAL | Status: DC
Start: 2023-11-17 — End: 2023-11-20
  Administered 2023-11-17 – 2023-11-20 (×6): 40 mg via ORAL
  Filled 2023-11-17 (×6): qty 1

## 2023-11-17 NOTE — H&P (Signed)
History and Physical   [name] ZOX:[id number] DOB: Jan 18, 1951 DOA: 11/17/2023  PCP: [name] Coe, MD   Patient coming from: Home  Chief Complaint: Hypoglycemia, altered mental status, hypoxia  HPI: [name] is a 72 y.o. 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  History obtained with assistance of chart review and family due to patient unclear on series of events.  Patient reportedly took insulin at around 4 AM.  Family is unsure why she took it but believes her glucometer may have had a high reading.  Later family noticed that patient was not responsive.  Per report patient was found with candy bar in her mouth, possibly trying to self-correct.  EMS was called and found patient to have initial CBG of 42 she initially received glucagon with improvement in glucose but remained unresponsive so she subsequently received a D10 infusion with a total 200 cc.  CBG following this was 127 and she became more responsive.  Noted to be hypoxic on room air and initially requiring up to 6 L of supplemental oxygen and was able to be weaned down to 2 L.  Is chronically on 1 L supplemental oxygen at baseline at home.  Transported to the ED for further evaluation.  Patient denies fevers, chills, chest pain, shortness of breath, abdominal pain, constipation, diarrhea, nausea. Does report some nausea now.  ED Course: Vitals BP ED notable for blood pressure in the 90s to 110 systolic, heart rate in the 50s to 60s, requiring 2 L to maintain saturations.  Lab workup included CMP with potassium 2.9, calcium 8.8,  albumin 3.0.  CBC with hemoglobin stable 10.3, platelets 143.  PT and INR mildly elevated at 7.2 and 1.4 respectively.  BNP indeterminate at 212.  Troponin 68 with repeat pending.  Lactic acid negative x 2.  CBG 100, 78, improvement to greater than 100 with D5 infusion.  Magnesium normal.  Urinalysis normal.  Blood cultures pending.  Chest x-ray with probable mild CHF/pulmonary edema.  CT head with no acute abnormality.  Patient received ceftriaxone, azithromycin in the ED as well as a dose of IV Lasix.  Started on D5 infusion as above and received 40 mEq of p.o. potassium.  Review of Systems: As per HPI otherwise all other systems reviewed and are negative.  Past Medical History:  Diagnosis Date   Abnormal echocardiogram    Anemia    Arthritis    BPPV (benign paroxysmal positional vertigo), right 06/13/2017   Bradycardia 03/07/2020   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   CHB (complete heart block) (HCC) 03/08/2020   CHF (congestive heart failure) (HCC)    Closed fracture of left proximal humerus 07/08/2019   Decreased range of motion of left shoulder 09/03/2020   Depression    Dizzy 03/07/2020   bradycardia   Edentulous 09/03/2020   GERD (gastroesophageal reflux disease)    Hemiparesis affecting left side as late effect of cerebrovascular accident (CVA) (HCC) 03/08/2020   Hemorrhoids    History of Falls with injury 03/08/2020   Fall resulting in left humeral fracture   Hyperlipidemia    Hypertension    Hypothyroidism    Intracranial vascular stenosis    Lacunar  infarction [facility])    Brain MRI 07/2021 multiple small remote pontine lacunar infarcts   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  Severe chronic microvascular ischemic disease of the pons, left middle cerebellar peduncle, and mini remote lacunar infarcts, progressive  since 2019.  Remote pontine microhemorrhage most likely of hypertensive etiology.  10/10/21 CT reveals chronic atrophic changes.     Malignant tumor of thyroid gland (HCC) 03/27/2022   Near syncope 03/08/2020   Paroxysmal atrial fibrillation (HCC)    Presence of permanent cardiac pacemaker    Proteinuria 03/08/2020   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 06/25/20 06/26/2020   Seasonal allergies    Stroke (HCC)    Suicidal ideation 11/16/2021   Thyroid cancer (HCC)    per pt S/p Total Thyroidectomy with Radioactive Iodine Therapy   Type 1 diabetes mellitus (HCC)     Past Surgical History:  Procedure Laterality Date   ABDOMINAL ANGIOGRAM N/A 05/14/2012   Procedure: ABDOMINAL ANGIOGRAM;  Surgeon: [name] Pert, MD;  Location: [facility] CATH LAB;  Service: Cardiovascular;  Laterality: N/A;   ABDOMINAL HYSTERECTOMY  1995   partial   ANKLE FRACTURE SURGERY Right    CARDIAC CATHETERIZATION     with coronary angiogram   cataract  Bilateral 2018   COLONOSCOPY N/A 09/09/2014   Procedure: COLONOSCOPY;  Surgeon: [name] Boop, MD;  Location: [facility] ENDOSCOPY;  Service: Endoscopy;  Laterality: N/A;   ESOPHAGOGASTRODUODENOSCOPY (EGD) WITH PROPOFOL N/A 12/03/2018   Procedure: ESOPHAGOGASTRODUODENOSCOPY (EGD) WITH PROPOFOL;  Surgeon: [name] Rist, MD;  Location: [facility] ENDOSCOPY;  Service: Gastroenterology;  Laterality: N/A;   LEFT AND RIGHT HEART CATHETERIZATION WITH CORONARY ANGIOGRAM N/A 05/14/2012   Procedure: LEFT AND RIGHT HEART CATHETERIZATION WITH CORONARY ANGIOGRAM;  Surgeon: [name] Pert, MD;  Location: [facility] CATH LAB;  Service: Cardiovascular;  Laterality: N/A;   LOOP RECORDER INSERTION N/A 12/23/2018   Procedure: LOOP RECORDER INSERTION;  Surgeon: [name] Lemming, MD;  Location: [facility] INVASIVE CV LAB;  Service: Cardiovascular;  Laterality: N/A;   LOOP RECORDER REMOVAL N/A 06/25/2020   Procedure: LOOP RECORDER REMOVAL;  Surgeon: [name] Lemming, MD;  Location:  [facility] INVASIVE CV LAB;  Service: Cardiovascular;  Laterality: N/A;   MINI METER   09/08/2014   ELECTRONIC INSULIN PUMP   PACEMAKER IMPLANT N/A 06/25/2020   Procedure: PACEMAKER IMPLANT;  Surgeon: [name] Lemming, MD;  Location: [facility] INVASIVE CV LAB;  Service: Cardiovascular;  Laterality: N/A;   TEE WITHOUT CARDIOVERSION N/A 10/16/2018   Procedure: TRANSESOPHAGEAL ECHOCARDIOGRAM (TEE);  Surgeon: Jodelle Red, MD;  Location: [facility] ENDOSCOPY;  Service: Cardiovascular;  Laterality: N/A;   THYROIDECTOMY  2006   for thyroid cancer    Social History  reports that she has never smoked. She has never used smokeless tobacco. She reports that she does not drink alcohol and does not use drugs.  Allergies  Allergen Reactions   Latex Rash    Family History  Problem Relation Age of Onset   Heart disease Mother    Hyperlipidemia Mother    Hypertension Mother    Renal Disease Mother        insufficiency   Heart attack Mother        in his 72's   Early death Father        Head Injury at Work   Hyperlipidemia Father    Hypertension Father  Stroke Son    Diabetes Maternal Aunt    Prostate cancer Paternal Uncle    Heart disease Maternal Aunt    Heart failure Maternal Grandfather    Heart disease Maternal Grandfather    Heart attack Maternal Grandfather        Died in his 61's   Breast cancer Neg Hx   Reviewed on admission  Prior to Admission medications   Medication Sig Start Date End Date Taking? Authorizing Provider  acetaminophen (TYLENOL) 325 MG tablet Take 2 tablets (650 mg total) by mouth every 6 (six) hours as needed for moderate pain. Patient taking differently: Take 650 mg by mouth every 8 (eight) hours. 08/30/21   Angiulli, Mcarthur Rossetti, PA-C  amLODipine (NORVASC) 10 MG tablet TAKE 1 TABLET(10 MG) BY MOUTH DAILY Patient taking differently: Take 2.5 mg by mouth daily. 12/26/22   Alicia Amel, MD  apixaban (ELIQUIS) 5 MG TABS tablet Take 1 tablet (5 mg total) by mouth 2 (two)  times daily. 10/15/23   Yates Decamp, MD  atorvastatin (LIPITOR) 80 MG tablet TAKE 1 TABLET(80 MG) BY MOUTH EVERY EVENING 10/23/22   Alicia Amel, MD  b complex vitamins capsule Take 1 capsule by mouth daily.    [provider]  budesonide-formoterol (SYMBICORT) 80-4.5 MCG/ACT inhaler Inhale 2 puffs into the lungs 2 (two) times daily. Patient not taking: Reported on 09/11/2023 08/20/23   Hunsucker, Lesia Sago, MD  busPIRone (BUSPAR) 5 MG tablet TAKE 1 TABLET(5 MG) BY MOUTH THREE TIMES DAILY Patient not taking: Reported on 09/11/2023 02/02/23   Alicia Amel, MD  cholecalciferol (VITAMIN D3) 25 MCG (1000 UNIT) tablet Take 1,000 Units by mouth daily.    [provider]  Cranberry 50 MG CHEW Chew 1 tablet by mouth daily.    [provider]  Fexofenadine HCl (ALLEGRA PO) Take 10 mg by mouth 2 (two) times daily. Patient not taking: Reported on 09/11/2023    [provider]  fluticasone (FLONASE) 50 MCG/ACT nasal spray SHAKE LIQUID AND USE 1 SPRAY IN EACH NOSTRIL IN THE MORNING AND AT BEDTIME 09/25/23   Hunsucker, Lesia Sago, MD  furosemide (LASIX) 40 MG tablet Take 1 tablet (40 mg total) by mouth 2 (two) times daily. 02/06/23   Custovic, Rozell Searing, DO  gabapentin (NEURONTIN) 300 MG capsule Take 1 capsule (300 mg total) by mouth as needed. Patient taking differently: Take 300 mg by mouth 3 (three) times daily. 01/09/23   Alicia Amel, MD  HUMALOG 100 UNIT/ML injection Inject 2-4 Units into the skin 3 (three) times daily before meals. 07/23/23   [provider]  levothyroxine (SYNTHROID) 175 MCG tablet TAKE 1 TABLET(175 MCG) BY MOUTH DAILY BEFORE BREAKFAST 12/13/22   Alicia Amel, MD  loratadine (CLARITIN) 10 MG tablet Take 10 mg by mouth daily.    [provider]  losartan (COZAAR) 25 MG tablet Take 1 tablet (25 mg total) by mouth daily. 10/30/23 01/28/24  Yates Decamp, MD  metoprolol succinate (TOPROL-XL) 50 MG 24 hr tablet Take 1 tablet (50 mg total) by  mouth daily. 01/16/23   Sheilah Pigeon, PA-C  multivitamin-lutein [facility]) CAPS capsule Take 1 capsule by mouth daily.    [provider]  OXYGEN Inhale 2 L into the lungs at bedtime.    [provider]  pantoprazole (PROTONIX) 40 MG tablet Take 1 tablet (40 mg total) by mouth 2 (two) times daily. 10/27/21   Medina-Vargas, Monina C, NP  polyethylene glycol powder (GLYCOLAX/MIRALAX) 17 GM/SCOOP  powder Take 17 g by mouth 2 (two) times daily as needed for moderate constipation. Patient taking differently: Take 17 g by mouth daily. 06/21/22   [name], Tawanna Cooler, MD  potassium chloride SA (KLOR-CON M) 20 MEQ tablet One po bid x 2 days, then one po once a day 09/12/23   Cathren Laine, MD  TRESIBA FLEXTOUCH 100 UNIT/ML FlexTouch Pen Inject 20 Units into the skin daily. 09/04/23   [provider]  Monte Fantasia INHUB 500-50 MCG/ACT AEPB Inhale 1 puff into the lungs in the morning and at bedtime. 08/31/23   [provider]    Physical Exam: Vitals:   11/17/23 1045 11/17/23 1047 11/17/23 1049 11/17/23 1215  BP:  (!) 97/57  113/70  Pulse:  (!) 56  62  Resp:  14  16  Temp:  (!) 97 F (36.1 C)    TempSrc:  Oral    SpO2: 96% 97%  96%  Weight:   104.3 kg   Height:  5\' 7"  (1.702 m) 5\' 7"  (1.702 m)     Physical Exam Constitutional:      General: She is not in acute distress.    Appearance: Normal appearance. She is obese.  HENT:     Head: Normocephalic and atraumatic.     Mouth/Throat:     Mouth: Mucous membranes are moist.     Pharynx: Oropharynx is clear.  Eyes:     Extraocular Movements: Extraocular movements intact.     Pupils: Pupils are equal, round, and reactive to light.  Cardiovascular:     Rate and Rhythm: Bradycardia present. Rhythm irregular.     Pulses: Normal pulses.     Heart sounds: Normal heart sounds.  Pulmonary:     Effort: Pulmonary effort is normal. No respiratory distress.     Comments: Decreased/distant breath sounds Abdominal:      General: Bowel sounds are normal. There is no distension.     Palpations: Abdomen is soft.     Tenderness: There is no abdominal tenderness.  Musculoskeletal:        General: No swelling or deformity.  Skin:    General: Skin is warm and dry.  Neurological:     Mental Status: Mental status is at baseline.     Comments: Chronic residual deficits from prior CVA.    Labs on Admission: I have personally reviewed following labs and imaging studies  CBC: Recent Labs  Lab 11/17/23 1103 11/17/23 1126  WBC 8.9  --   NEUTROABS 7.8*  --   HGB 10.3* 11.6*  HCT 34.1* 34.0*  MCV 96.1  --   PLT 143*  --     Basic Metabolic Panel: Recent Labs  Lab 11/17/23 1103 11/17/23 1126  NA 140 142  K 2.9* 3.9  CL 104 105  CO2 24  --   GLUCOSE 81 78  BUN 15 22  CREATININE 0.95 1.00  CALCIUM 8.8*  --   MG 1.9  --     GFR: Estimated Creatinine Clearance: 63.2 mL/min (by C-G formula based on SCr of 1 mg/dL).  Liver Function Tests: Recent Labs  Lab 11/17/23 1103  AST 26  ALT 29  ALKPHOS 71  BILITOT 0.5  PROT 6.6  ALBUMIN 3.0*    Urine analysis:    Component Value Date/Time   COLORURINE YELLOW 11/17/2023 1210   APPEARANCEUR CLEAR 11/17/2023 1210   LABSPEC 1.016 11/17/2023 1210   PHURINE 5.0 11/17/2023 1210   GLUCOSEU NEGATIVE 11/17/2023 1210   HGBUR NEGATIVE 11/17/2023 1210  BILIRUBINUR NEGATIVE 11/17/2023 1210   BILIRUBINUR negative 10/29/2018 1430   BILIRUBINUR Negative 11/24/2016 0922   KETONESUR NEGATIVE 11/17/2023 1210   PROTEINUR NEGATIVE 11/17/2023 1210   UROBILINOGEN 0.2 10/29/2018 1430   UROBILINOGEN 0.2 08/29/2015 0947   NITRITE NEGATIVE 11/17/2023 1210   LEUKOCYTESUR NEGATIVE 11/17/2023 1210    Radiological Exams on Admission: CT Head Wo Contrast  Result Date: 11/17/2023 CLINICAL DATA:  Mental status change of unknown cause. Hypoglycemia. Diabetic. EXAM: CT HEAD WITHOUT CONTRAST TECHNIQUE: Contiguous axial images were obtained from the base of the  skull through the vertex without intravenous contrast. RADIATION DOSE REDUCTION: This exam was performed according to the departmental dose-optimization program which includes automated exposure control, adjustment of the mA and/or kV according to patient size and/or use of iterative reconstruction technique. COMPARISON:  10/31/2021 CT.  08/05/2021 MRI. FINDINGS: Brain: Advanced chronic ischemic changes of the brainstem. No acute posterior fossa insult identified. Cerebral hemispheres show mild chronic small-vessel ischemic changes of the white matter. No sign of acute stroke, mass, hemorrhage, hydrocephalus or extra-axial collection. Vascular: There is atherosclerotic calcification of the major vessels at the base of the brain. Skull: Negative Sinuses/Orbits: Clear/normal Other: None IMPRESSION: No acute CT finding. Advanced chronic ischemic changes of the brainstem. Mild chronic small-vessel ischemic changes of the cerebral hemispheric white matter. Electronically Signed   By: Paulina Fusi M.D.   On: 11/17/2023 13:02   DG Chest Port 1 View  Result Date: 11/17/2023 CLINICAL DATA:  Questionable sepsis - evaluate for abnormality. EXAM: PORTABLE CHEST 1 VIEW COMPARISON:  09/10/2023. FINDINGS: Low lung volume. There is diffuse pulmonary vascular congestion. There are probable atelectatic changes at the lung bases. No acute consolidation or lung collapse. Bilateral lateral costophrenic angles are clear. Stable mildly enlarged cardio-mediastinal silhouette. There is a left sided 2-lead pacemaker. Presumed loop recorder device noted overlying the left lower chest wall. No acute osseous abnormalities. The soft tissues are within normal limits. IMPRESSION: *Probable mild congestive heart failure/pulmonary edema. Electronically Signed   By: Jules Schick M.D.   On: 11/17/2023 11:30    EKG: Independently reviewed.  Initially several beats of paced rhythm followed by atrial fibrillation at 58 bpm.  Evidence of LVH.   Nonspecific T wave changes.  QTc prolonged at 525 per electronic read.  Assessment/Plan Principal Problem:   Acute on chronic respiratory failure with hypoxia (HCC) Active Problems:   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)   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)   Hypothyroidism, postsurgical   GERD (gastroesophageal reflux disease)   Morbid obesity (HCC)   Depression   Insulin pump in place   OSA (obstructive sleep apnea)   Paroxysmal atrial fibrillation (HCC)   Dependent for wheelchair mobility   Complete heart block (HCC)   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 06/25/20   Chronic lower back pain   Mild cognitive impairment   Essential hypertension   History of CVA (cerebrovascular accident)   Obesity hypoventilation syndrome (HCC)   Hypoglycemia Diabetes > Patient presented following hypoglycemic event at home.  As per HPI found unresponsive and required glucagon followed by D10 to improve responsiveness and blood sugar. > Blood sugar again drifted down in the ED and was started on D5 infusion. > Mentation has improved to baseline but due to requiring infusion and hypoxia as below, patient to be monitored overnight. - Monitor on telemetry overnight - Continue with D5 infusion for now, will  reduce rate - Trend CBGs - Hold home insulin  Acute on chronic respiratory failure with hypoxia OSA, OHS, pulmonary hypertension Rule out CHF exacerbation, chronic diastolic CHF Rule out aspiration pneumonitis/pneumonia > Patient on around 1 L of baseline oxygen sometimes more at night. > Chronic respiratory failure believed to be due to combination of OHS, pulmonary hypertension due to undertreated OSA as patient is not consistent with BiPAP. > Initially required 6 L for EMS and has been weaned down to 2 L but still more than baseline. > Chest x-ray read as probable pulmonary edema  though concern for possible aspiration component given she was found with some food in her mouth per reports. > Did receive a dose of Lasix in the ED as well as ceftriaxone and azithromycin. - Monitor on telemetry with continuous pulse ox overnight - Continue supplemental oxygen, wean to baseline as tolerated - Continues to decline BiPAP, supplemental oxygen only - Given BNP borderline at 212 (though possibly falsely low given obesity) and received dose of IV Lasix with low normal blood pressure in the ED will hold off on further Lasix, last echo was in March with EF 60-65%, indeterminate diastolic function, normal RV function. - Hold off on further antibiotics as there is no leukocytosis and if this is pneumonitis alone may not require an - Has been prescribed Wixela and Symbicort in the past but not taking these, working to clarify with pharmacy which she is taking if he - Holding Lasix for now, may resume tomorrow  Hypertension - Holding home amlodipine, Lasix, losartan, metoprolol given low normal blood pressure in the ED  GERD - Continue home PPI  Diabetes > Hypoglycemia as above.  No longer on insulin pump. > At home is on 20 units long-acting daily and SSI - Will continue to hold home insulin - If persistently elevated glucose on D5 infusion, will discontinue this and start SSI  Atrial fibrillation > Some atrial fibrillation in the ED - Holding metoprolol due to low normal blood pressure, resume tomorrow - Continue home Eliquis  Complete heart block > Status post pacemaker.  Several paced beats on EKG in ED. - Continue to monitor  ?Prolonged QTc > Read as prolonged on EKG in the ED, but unclear if A-fib/paced rhythm may be interfering.  Will recheck EKG now and in the morning. - Will avoid QTc prolonging medications in the meantime and let us repeat EKG shows improvement  Hypothyroidism - Continue home Synthroid  History of CVA > Residual left hemiparesis and wheelchair  dependence. - Continue home Eliquis  Mild cognitive impairment Depression > History of suicidal ideation per chart - Denies any recurrence suicidal ideation  Obesity - Noted   DVT prophylaxis: Eliquis Code Status:   Full Family Communication:  None on admission. EDP did update family but they were gone prior to my interview.   Disposition Plan:   Patient is from:  Home  Anticipated DC to:  Home  Anticipated DC date:  1 to 3 days  Anticipated DC barriers: None  Consults called:  None Admission status:  Observation, telemetry  Severity of Illness: The appropriate patient status for this patient is OBSERVATION. Observation status is judged to be reasonable and necessary in order to provide the required intensity of service to ensure the patient's safety. The patient's presenting symptoms, physical exam findings, and initial radiographic and laboratory data in the context of their medical condition is felt to place them at decreased risk for further clinical deterioration. Furthermore, it is  anticipated that the patient will be medically stable for discharge from the hospital within 2 midnights of admission.    Synetta Fail MD [facility]  How to contact the [facility] Attending or Consulting provider 7A - 7P or covering provider during after hours 7P -7A, for this patient?   Check the care team in [facility] and look for a) attending/consulting [facility] provider listed and b) the [facility] team listed Log into [email or web address] and use [place]'s universal password to access. If you do not have the password, please contact the hospital operator. Locate the [facility] provider you are looking for under [facility] and page to a number that you can be directly reached. If you still have difficulty reaching the provider, please page the [facility] (Director on Call) for the Hospitalists listed on amion for assistance.  11/17/2023, 2:52 PM

## 2023-11-17 NOTE — ED Triage Notes (Signed)
Pt bib ems from home c/o hypoglycemia. Pt was given some insulin around 4 am and family noted pt not responding. Pt was initial CBG 42 GCS 3. Pt was given 1 mg glucagon CBG 123 GCS 3. Pt started on D10 drip  CBG 127 GCS 14-15. Pt received a total of 200 cc D10. Pt baseline 2L nasal canula. It was noted pt was 86%. O2 increased to 6 L O2 98%.  BP 135/50 HR 60

## 2023-11-17 NOTE — Plan of Care (Signed)
Care plan reviewed.

## 2023-11-17 NOTE — ED Notes (Signed)
ED TO INPATIENT HANDOFF REPORT  ED Nurse Name and Phone #:  Jennetta Flood [phone]  S Name/Age/Gender [name] 72 y.o. female Room/Bed: 040C/040C  Code Status   Code Status: Full Code  Home/SNF/Other Home Patient oriented to: self, place, time, and situation Is this baseline? Yes   Triage Complete: Triage complete  Chief Complaint Acute on chronic respiratory failure with hypoxia (HCC) [J96.21]  Triage Note Pt bib ems from home c/o hypoglycemia. Pt was given some insulin around 4 am and family noted pt not responding. Pt was initial CBG 42 GCS 3. Pt was given 1 mg glucagon CBG 123 GCS 3. Pt started on D10 drip  CBG 127 GCS 14-15. Pt received a total of 200 cc D10. Pt baseline 2L nasal canula. It was noted pt was 86%. O2 increased to 6 L O2 98%.  BP 135/50 HR 60   Allergies Allergies  Allergen Reactions   Latex Rash    Level of Care/Admitting Diagnosis ED Disposition     ED Disposition  Admit   Condition  --   Comment  Hospital Area: [facility] [[id number]]  Level of Care: Telemetry Medical [[id number]]  May place patient in observation at [facility] or [facility] if equivalent level of care is available:: No  Covid Evaluation: Asymptomatic - no recent exposure (last 10 days) testing not required  Diagnosis: Acute on chronic respiratory failure with hypoxia [facility]) [[id number]]  Admitting Physician: Synetta Fail [[id number]]  Attending Physician: Synetta Fail [[id number]]          B Medical/Surgery History Past Medical History:  Diagnosis Date   Abnormal echocardiogram    Anemia    Arthritis    BPPV (benign paroxysmal positional vertigo), right 06/13/2017   Bradycardia 03/07/2020   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   CHB (complete heart block) (HCC) 03/08/2020   CHF (congestive heart failure) (HCC)    Closed fracture of left proximal humerus 07/08/2019   Decreased range of motion of  left shoulder 09/03/2020   Depression    Dizzy 03/07/2020   bradycardia   Edentulous 09/03/2020   GERD (gastroesophageal reflux disease)    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  Severe chronic microvascular ischemic disease of the pons, left middle cerebellar peduncle, and mini remote lacunar infarcts, progressive since 2019.  Remote pontine microhemorrhage most likely of hypertensive etiology.  10/10/21 CT reveals chronic atrophic changes.     Malignant tumor of thyroid gland (HCC) 03/27/2022   Near syncope 03/08/2020   Paroxysmal atrial fibrillation (HCC)    Presence of permanent cardiac pacemaker    Proteinuria 03/08/2020   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 06/25/20 06/26/2020   Seasonal allergies    Stroke (HCC)    Suicidal ideation 11/16/2021   Thyroid cancer (HCC)    per pt S/p Total Thyroidectomy with Radioactive Iodine Therapy   Type 1 diabetes mellitus (HCC)  Past Surgical History:  Procedure Laterality Date   ABDOMINAL ANGIOGRAM N/A 05/14/2012   Procedure: ABDOMINAL ANGIOGRAM;  Surgeon: [name], MD;  Location[facility] LAB;  Service: Cardiovascular;  Laterality: N/A;   ABDOMINAL HYSTERECTOMY  1995   partial   ANKLE FRACTURE SURGERY Right    CARDIAC CATHETERIZATION     with coronary angiogram   cataract  Bilateral 2018   COLONOSCOPY N/A 09/09/2014   Procedure: COLONOSCOPY;  Surgeon: Iva Boop, MD;  Location: [facility] ENDOSCOPY;  Service:  Endoscopy;  Laterality: N/A;   ESOPHAGOGASTRODUODENOSCOPY (EGD) WITH PROPOFOL N/A 12/03/2018   Procedure: ESOPHAGOGASTRODUODENOSCOPY (EGD) WITH PROPOFOL;  Surgeon: Sherrilyn Rist, MD;  Location: [facility] ENDOSCOPY;  Service: Gastroenterology;  Laterality: N/A;   LEFT AND RIGHT HEART CATHETERIZATION WITH CORONARY ANGIOGRAM N/A 05/14/2012   Procedure: LEFT AND RIGHT HEART CATHETERIZATION WITH CORONARY ANGIOGRAM;  Surgeon: [name], MD;  Location: [facility] CATH LAB;  Service: Cardiovascular;  Laterality: N/A;   LOOP RECORDER INSERTION N/A 12/23/2018   Procedure: LOOP RECORDER INSERTION;  Surgeon: Regan Lemming, MD;  Location: [facility] INVASIVE CV LAB;  Service: Cardiovascular;  Laterality: N/A;   LOOP RECORDER REMOVAL N/A 06/25/2020   Procedure: LOOP RECORDER REMOVAL;  Surgeon: Regan Lemming, MD;  Location: [facility] INVASIVE CV LAB;  Service: Cardiovascular;  Laterality: N/A;   MINI METER   09/08/2014   ELECTRONIC INSULIN PUMP   PACEMAKER IMPLANT N/A 06/25/2020   Procedure: PACEMAKER IMPLANT;  Surgeon: Regan Lemming, MD;  Location: [facility] INVASIVE CV LAB;  Service: Cardiovascular;  Laterality: N/A;   TEE WITHOUT CARDIOVERSION N/A 10/16/2018   Procedure: TRANSESOPHAGEAL ECHOCARDIOGRAM (TEE);  Surgeon: Jodelle Red, MD;  Location: [facility] ENDOSCOPY;  Service: Cardiovascular;  Laterality: N/A;   THYROIDECTOMY  2006   for thyroid cancer     A IV Location/Drains/Wounds Patient Lines/Drains/Airways Status     Active Line/Drains/Airways     Name Placement date Placement time Site Days   Peripheral IV 11/17/23 20 G Posterior;Right Hand 11/17/23  1050  Hand  less than 1   Peripheral IV 11/17/23 20 G Posterior;Right Forearm 11/17/23  1118  Forearm  less than 1   Pressure Injury 08/10/21 Heel Left;Lateral Unstageable - Full thickness tissue loss in which the base of the injury is covered by slough (yellow, tan, gray, green or brown) and/or eschar (tan, brown or black) in the wound bed.  08/10/21  --  -- 829   Pressure Injury 02/12/22 Heel Posterior;Right 02/12/22  0947  -- 643            Intake/Output Last 24 hours No intake or output data in the 24 hours ending 11/17/23 1457  Labs/Imaging Results for orders placed or performed during the hospital encounter of 11/17/23 (from the past 48 hour(s))  CBG monitoring, ED     Status: Abnormal   Collection Time: 11/17/23 10:44 AM  Result Value Ref Range   Glucose-Capillary 105 (H) 70 - 99 mg/dL    Comment: Glucose reference range applies only to samples taken after fasting for at least 8 hours.   Comment 1 Notify RN    Comment 2 Document in Chart   Comprehensive metabolic panel     Status: Abnormal   Collection Time: 11/17/23 11:03 AM  Result Value Ref Range   Sodium 140 135 - 145 mmol/L   Potassium 2.9 (L) 3.5 - 5.1 mmol/L   Chloride 104 98 - 111 mmol/L   CO2 24 22 - 32 mmol/L   Glucose, Bld  81 70 - 99 mg/dL    Comment: Glucose reference range applies only to samples taken after fasting for at least 8 hours.   BUN 15 8 - 23 mg/dL   Creatinine, Ser 3.08 0.44 - 1.00 mg/dL   Calcium 8.8 (L) 8.9 - 10.3 mg/dL   Total Protein 6.6 6.5 - 8.1 g/dL   Albumin 3.0 (L) 3.5 - 5.0 g/dL   AST 26 15 - 41 U/L   ALT 29 0 - 44 U/L   Alkaline Phosphatase 71 38 - 126 U/L   Total Bilirubin 0.5 <1.2 mg/dL   GFR, Estimated >65 >78 mL/min    Comment: (NOTE) Calculated using the CKD-EPI Creatinine Equation (2021)    Anion gap 12 5 - 15    Comment: Performed at [facility], [address] [place], [place] [address]  CBC with Differential     Status: Abnormal   Collection Time: 11/17/23 11:03 AM  Result Value Ref Range   WBC 8.9 4.0 - 10.5 K/uL   RBC 3.55 (L) 3.87 - 5.11 MIL/uL   Hemoglobin 10.3 (L) 12.0 - 15.0 g/dL   HCT 95.2 (L) 84.1 - 32.4 %   MCV 96.1 80.0 - 100.0 fL   MCH 29.0 26.0 - 34.0 pg   MCHC 30.2 30.0 - 36.0 g/dL   RDW 40.1 02.7 - 25.3 %   Platelets 143 (L) 150 - 400 K/uL   nRBC 0.0 0.0 - 0.2 %    Neutrophils Relative % 87 %   Neutro Abs 7.8 (H) 1.7 - 7.7 K/uL   Lymphocytes Relative 8 %   Lymphs Abs 0.7 0.7 - 4.0 K/uL   Monocytes Relative 4 %   Monocytes Absolute 0.4 0.1 - 1.0 K/uL   Eosinophils Relative 0 %   Eosinophils Absolute 0.0 0.0 - 0.5 K/uL   Basophils Relative 0 %   Basophils Absolute 0.0 0.0 - 0.1 K/uL   Immature Granulocytes 1 %   Abs Immature Granulocytes 0.04 0.00 - 0.07 K/uL    Comment: Performed at [facility], [address], [place], [place] [address]  Protime-INR     Status: Abnormal   Collection Time: 11/17/23 11:03 AM  Result Value Ref Range   Prothrombin Time 17.2 (H) 11.4 - 15.2 seconds   INR 1.4 (H) 0.8 - 1.2    Comment: (NOTE) INR goal varies based on device and disease states. Performed at [facility], [address], [place], [place] [address]   Brain natriuretic peptide     Status: Abnormal   Collection Time: 11/17/23 11:03 AM  Result Value Ref Range   B Natriuretic Peptide 212.5 (H) 0.0 - 100.0 pg/mL    Comment: Performed at [facility], [address] [place], [place] [address]  Troponin I (High Sensitivity)     Status: Abnormal   Collection Time: 11/17/23 11:03 AM  Result Value Ref Range   Troponin I (High Sensitivity) 68 (H) <18 ng/L    Comment: (NOTE) Elevated high sensitivity troponin I (hsTnI) values and significant  changes across serial measurements may suggest ACS but many other  chronic and acute conditions are known to elevate hsTnI results.  Refer to the "Links" section for chest pain algorithms and additional  guidance. Performed at [facility], [address], [place], [place] [address]   Magnesium     Status: None   Collection Time: 11/17/23 11:03 AM  Result Value Ref Range   Magnesium 1.9 1.7 -  2.4 mg/dL    Comment: Performed at [facility], [address], [place], [place] [address]  I-Stat Chem 8, ED     Status: Abnormal   Collection Time: 11/17/23 11:26 AM  Result Value Ref Range    Sodium 142 135 - 145 mmol/L   Potassium 3.9 3.5 - 5.1 mmol/L   Chloride 105 98 - 111 mmol/L   BUN 22 8 - 23 mg/dL   Creatinine, Ser 6.04 0.44 - 1.00 mg/dL   Glucose, Bld 78 70 - 99 mg/dL    Comment: Glucose reference range applies only to samples taken after fasting for at least 8 hours.   Calcium, Ion 1.05 (L) 1.15 - 1.40 mmol/L   TCO2 28 22 - 32 mmol/L   Hemoglobin 11.6 (L) 12.0 - 15.0 g/dL   HCT 54.0 (L) 98.1 - 19.1 %  I-Stat Lactic Acid, ED     Status: None   Collection Time: 11/17/23 11:27 AM  Result Value Ref Range   Lactic Acid, Venous 1.5 0.5 - 1.9 mmol/L  POC CBG, ED     Status: None   Collection Time: 11/17/23 11:52 AM  Result Value Ref Range   Glucose-Capillary 78 70 - 99 mg/dL    Comment: Glucose reference range applies only to samples taken after fasting for at least 8 hours.   Comment 1 Notify RN    Comment 2 Document in Chart   Urinalysis, w/ Reflex to Culture (Infection Suspected) -Urine, Clean Catch     Status: None   Collection Time: 11/17/23 12:10 PM  Result Value Ref Range   Specimen Source URINE, CLEAN CATCH    Color, Urine YELLOW YELLOW   APPearance CLEAR CLEAR   Specific Gravity, Urine 1.016 1.005 - 1.030   pH 5.0 5.0 - 8.0   Glucose, UA NEGATIVE NEGATIVE mg/dL   Hgb urine dipstick NEGATIVE NEGATIVE   Bilirubin Urine NEGATIVE NEGATIVE   Ketones, ur NEGATIVE NEGATIVE mg/dL   Protein, ur NEGATIVE NEGATIVE mg/dL   Nitrite NEGATIVE NEGATIVE   Leukocytes,Ua NEGATIVE NEGATIVE   RBC / HPF 0-5 0 - 5 RBC/hpf   WBC, UA 0-5 0 - 5 WBC/hpf    Comment:        Reflex urine culture not performed if WBC <=10, OR if Squamous epithelial cells >5. If Squamous epithelial cells >5 suggest recollection.    Bacteria, UA NONE SEEN NONE SEEN   Squamous Epithelial / HPF 0-5 0 - 5 /HPF   Mucus PRESENT    Hyaline Casts, UA PRESENT     Comment: Performed at [facility], [address], [place], [place] [address]  Troponin I (High Sensitivity)     Status: Abnormal    Collection Time: 11/17/23  1:00 PM  Result Value Ref Range   Troponin I (High Sensitivity) 119 (HH) <18 ng/L    Comment: DELTA CHECK NOTED CRITICAL RESULT CALLED TO, READ BACK BY AND VERIFIED WITH S. Panhwer RN, @1440 , 11/17/23, Dabdee,T. (NOTE) Elevated high sensitivity troponin I (hsTnI) values and significant  changes across serial measurements may suggest ACS but many other  chronic and acute conditions are known to elevate hsTnI results.  Refer to the "Links" section for chest pain algorithms and additional  guidance. Performed at [facility], [address] [place], [place] [address]   I-Stat Lactic Acid, ED     Status: None   Collection Time: 11/17/23  1:22 PM  Result Value Ref Range   Lactic Acid, Venous  1.9 0.5 - 1.9 mmol/L  POC CBG, ED     Status: None   Collection Time: 11/17/23  1:31 PM  Result Value Ref Range   Glucose-Capillary 93 70 - 99 mg/dL    Comment: Glucose reference range applies only to samples taken after fasting for at least 8 hours.  POC CBG, ED     Status: Abnormal   Collection Time: 11/17/23  2:37 PM  Result Value Ref Range   Glucose-Capillary 110 (H) 70 - 99 mg/dL    Comment: Glucose reference range applies only to samples taken after fasting for at least 8 hours.   Comment 1 Notify RN    Comment 2 Document in Chart    *Note: Due to a large number of results and/or encounters for the requested time period, some results have not been displayed. A complete set of results can be found in Results Review.   CT Head Wo Contrast  Result Date: 11/17/2023 CLINICAL DATA:  Mental status change of unknown cause. Hypoglycemia. Diabetic. EXAM: CT HEAD WITHOUT CONTRAST TECHNIQUE: Contiguous axial images were obtained from the base of the skull through the vertex without intravenous contrast. RADIATION DOSE REDUCTION: This exam was performed according to the departmental dose-optimization program which includes automated exposure control, adjustment of the  mA and/or kV according to patient size and/or use of iterative reconstruction technique. COMPARISON:  10/31/2021 CT.  08/05/2021 MRI. FINDINGS: Brain: Advanced chronic ischemic changes of the brainstem. No acute posterior fossa insult identified. Cerebral hemispheres show mild chronic small-vessel ischemic changes of the white matter. No sign of acute stroke, mass, hemorrhage, hydrocephalus or extra-axial collection. Vascular: There is atherosclerotic calcification of the major vessels at the base of the brain. Skull: Negative Sinuses/Orbits: Clear/normal Other: None IMPRESSION: No acute CT finding. Advanced chronic ischemic changes of the brainstem. Mild chronic small-vessel ischemic changes of the cerebral hemispheric white matter. Electronically Signed   By: Paulina Fusi M.D.   On: 11/17/2023 13:02   DG Chest Port 1 View  Result Date: 11/17/2023 CLINICAL DATA:  Questionable sepsis - evaluate for abnormality. EXAM: PORTABLE CHEST 1 VIEW COMPARISON:  09/10/2023. FINDINGS: Low lung volume. There is diffuse pulmonary vascular congestion. There are probable atelectatic changes at the lung bases. No acute consolidation or lung collapse. Bilateral lateral costophrenic angles are clear. Stable mildly enlarged cardio-mediastinal silhouette. There is a left sided 2-lead pacemaker. Presumed loop recorder device noted overlying the left lower chest wall. No acute osseous abnormalities. The soft tissues are within normal limits. IMPRESSION: *Probable mild congestive heart failure/pulmonary edema. Electronically Signed   By: Jules Schick M.D.   On: 11/17/2023 11:30    Pending Labs Unresulted Labs (From admission, onward)     Start     Ordered   11/18/23 0500  Comprehensive metabolic panel  Tomorrow morning,   R        11/17/23 1422   11/18/23 0500  CBC  Tomorrow morning,   R        11/17/23 1422   11/17/23 1047  Blood Culture (routine x 2)  (Undifferentiated presentation (screening labs and basic nursing  orders))  BLOOD CULTURE X 2,   STAT      11/17/23 1047            Vitals/Pain Today's Vitals   11/17/23 1047 11/17/23 1049 11/17/23 1141 11/17/23 1215  BP: (!) 97/57   113/70  Pulse: (!) 56   62  Resp: 14   16  Temp: (!) 97 F (36.1  C)     TempSrc: Oral     SpO2: 97%   96%  Weight:  104.3 kg    Height: 5\' 7"  (1.702 m) 5\' 7"  (1.702 m)    PainSc: 0-No pain  0-No pain     Isolation Precautions No active isolations  Medications Medications  dextrose 5 % and 0.9 % NaCl with KCl 20 mEq/L infusion ( Intravenous New Bag/Given 11/17/23 1306)  levothyroxine (SYNTHROID) tablet 175 mcg (has no administration in time range)  pantoprazole (PROTONIX) EC tablet 40 mg (has no administration in time range)  apixaban (ELIQUIS) tablet 5 mg (has no administration in time range)  sodium chloride flush (NS) 0.9 % injection 3 mL (has no administration in time range)  acetaminophen (TYLENOL) tablet 650 mg (has no administration in time range)    Or  acetaminophen (TYLENOL) suppository 650 mg (has no administration in time range)  polyethylene glycol (MIRALAX / GLYCOLAX) packet 17 g (has no administration in time range)  sorbitol 70 % solution 30 mL (has no administration in time range)  metoprolol succinate (TOPROL-XL) 24 hr tablet 50 mg (has no administration in time range)  cefTRIAXone (ROCEPHIN) 1 g in sodium chloride 0.9 % 100 mL IVPB (0 g Intravenous Stopped 11/17/23 1216)  azithromycin (ZITHROMAX) 500 mg in sodium chloride 0.9 % 250 mL IVPB (0 mg Intravenous Stopped 11/17/23 1321)  potassium chloride SA (KLOR-CON M) CR tablet 40 mEq (40 mEq Oral Given 11/17/23 1255)  furosemide (LASIX) injection 40 mg (40 mg Intravenous Given 11/17/23 1418)    Mobility non-ambulatory     Focused Assessments Endocrine  R Recommendations: See Admitting Provider Note  Report given to:   Additional Notes:

## 2023-11-17 NOTE — ED Provider Notes (Signed)
[place] EMERGENCY DEPARTMENT AT [facility] Provider Note   CSN: [id number] Arrival date & time: 11/17/23  1030     History  Chief Complaint  Patient presents with   Hypoglycemia    Sheria Lang Candid Cernak is a 72 y.o. female.  HPI Patient presents for altered mental status.  Medical history includes DM, OSA, GERD, CHF, CVA, thyroid tumor, anemia, arthritis, heart block s/p Medtronic pacemaker.  She reports that she is no longer on insulin pump.  Family report that she took a dose of insulin at 4 AM.  It is unclear why but she may have had elevated blood sugar readings on her Dexcom.  This morning, she was found to be unresponsive by family.  EMS was called to the scene.  She was a GCS on EMS arrival.  Her blood sugar was in the 40s.  She did have a candy in her mouth which was removed.  She was given a 1 mg of Decadron.  She was started on D10.  Blood sugar improved but patient remained minimally responsive.  Her responsiveness gradually improved prior to arrival.  Her mentation remains slowed.  Patient, herself, currently denies any areas of discomfort.  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    Home Medications Prior to Admission medications   Medication Sig Start Date End Date Taking? Authorizing Provider  acetaminophen (TYLENOL) 325 MG tablet Take 2 tablets (650 mg total) by mouth every 6 (six) hours as needed for moderate pain. Patient taking differently: Take 650 mg by mouth every 8 (eight) hours. 08/30/21   Angiulli, Mcarthur Rossetti, PA-C  amLODipine (NORVASC) 10 MG tablet TAKE 1 TABLET(10 MG) BY MOUTH DAILY Patient taking differently: Take 2.5 mg by mouth daily. 12/26/22   Alicia Amel, MD  apixaban (ELIQUIS) 5 MG TABS tablet Take 1 tablet (5 mg total) by mouth 2 (two) times daily. 10/15/23   Yates Decamp, MD  atorvastatin (LIPITOR) 80 MG tablet TAKE 1 TABLET(80 MG) BY MOUTH EVERY EVENING 10/23/22   Alicia Amel, MD  b complex  vitamins capsule Take 1 capsule by mouth daily.    [provider]  budesonide-formoterol (SYMBICORT) 80-4.5 MCG/ACT inhaler Inhale 2 puffs into the lungs 2 (two) times daily. Patient not taking: Reported on 09/11/2023 08/20/23   Hunsucker, Lesia Sago, MD  busPIRone (BUSPAR) 5 MG tablet TAKE 1 TABLET(5 MG) BY MOUTH THREE TIMES DAILY Patient not taking: Reported on 09/11/2023 02/02/23   Alicia Amel, MD  cholecalciferol (VITAMIN D3) 25 MCG (1000 UNIT) tablet Take 1,000 Units by mouth daily.    [provider]  Cranberry 50 MG CHEW Chew 1 tablet by mouth daily.    [provider]  Fexofenadine HCl (ALLEGRA PO) Take 10 mg by mouth 2 (two) times daily. Patient not taking: Reported on 09/11/2023    [provider]  fluticasone (FLONASE) 50 MCG/ACT nasal spray SHAKE LIQUID AND USE 1 SPRAY IN EACH NOSTRIL IN THE MORNING AND AT BEDTIME 09/25/23   Hunsucker, Lesia Sago, MD  furosemide (LASIX) 40 MG tablet Take 1 tablet (40 mg total) by mouth 2 (two) times daily. 02/06/23   Custovic, Rozell Searing, DO  gabapentin (NEURONTIN) 300 MG capsule Take 1 capsule (300 mg total) by mouth as needed. Patient taking differently: Take 300 mg by mouth 3 (three) times daily. 01/09/23   Alicia Amel, MD  HUMALOG 100 UNIT/ML injection Inject 2-4 Units into the skin 3 (three) times daily before meals.  07/23/23   [provider]  levothyroxine (SYNTHROID) 175 MCG tablet TAKE 1 TABLET(175 MCG) BY MOUTH DAILY BEFORE BREAKFAST 12/13/22   Alicia Amel, MD  loratadine (CLARITIN) 10 MG tablet Take 10 mg by mouth daily.    [provider]  losartan (COZAAR) 25 MG tablet Take 1 tablet (25 mg total) by mouth daily. 10/30/23 01/28/24  Yates Decamp, MD  metoprolol succinate (TOPROL-XL) 50 MG 24 hr tablet Take 1 tablet (50 mg total) by mouth daily. 01/16/23   Sheilah Pigeon, PA-C  multivitamin-lutein [facility]) CAPS capsule Take 1 capsule by mouth daily.    [provider]  OXYGEN  Inhale 2 L into the lungs at bedtime.    [provider]  pantoprazole (PROTONIX) 40 MG tablet Take 1 tablet (40 mg total) by mouth 2 (two) times daily. 10/27/21   Medina-Vargas, Monina C, NP  polyethylene glycol powder (GLYCOLAX/MIRALAX) 17 GM/SCOOP powder Take 17 g by mouth 2 (two) times daily as needed for moderate constipation. Patient taking differently: Take 17 g by mouth daily. 06/21/22   [name], Tawanna Cooler, MD  potassium chloride SA (KLOR-CON M) 20 MEQ tablet One po bid x 2 days, then one po once a day 09/12/23   Cathren Laine, MD  TRESIBA FLEXTOUCH 100 UNIT/ML FlexTouch Pen Inject 20 Units into the skin daily. 09/04/23   [provider]  Monte Fantasia INHUB 500-50 MCG/ACT AEPB Inhale 1 puff into the lungs in the morning and at bedtime. 08/31/23   [provider]      Allergies    Latex    Review of Systems   Review of Systems  Unable to perform ROS: Mental status change    Physical Exam Updated Vital Signs BP 113/70   Pulse 62   Temp (!) 97 F (36.1 C) (Oral)   Resp 16   Ht 5\' 7"  (1.702 m)   Wt 104.3 kg   SpO2 96%   BMI 36.02 kg/m  Physical Exam Vitals and nursing note reviewed.  Constitutional:      General: She is not in acute distress.    Appearance: Normal appearance. She is well-developed. She is not ill-appearing, toxic-appearing or diaphoretic.  HENT:     Head: Normocephalic and atraumatic.     Right Ear: External ear normal.     Left Ear: External ear normal.     Nose: Nose normal.     Mouth/Throat:     Mouth: Mucous membranes are moist.  Eyes:     Extraocular Movements: Extraocular movements intact.     Conjunctiva/sclera: Conjunctivae normal.  Cardiovascular:     Rate and Rhythm: Normal rate and regular rhythm.     Heart sounds: No murmur heard. Pulmonary:     Effort: Pulmonary effort is normal. No respiratory distress.     Breath sounds: Rhonchi and rales present. No wheezing.  Abdominal:     General: There is no distension.      Palpations: Abdomen is soft.     Tenderness: There is no abdominal tenderness.  Musculoskeletal:        General: No swelling or deformity.     Cervical back: Normal range of motion and neck supple.  Skin:    General: Skin is warm and dry.     Coloration: Skin is not jaundiced or pale.  Neurological:     Mental Status: She is alert. She is disoriented.     Motor: Weakness (Left-sided, baseline per patient) present.  Psychiatric:  Mood and Affect: Mood normal.        Behavior: Behavior normal.     ED Results / Procedures / Treatments   Labs (all labs ordered are listed, but only abnormal results are displayed) Labs Reviewed  COMPREHENSIVE METABOLIC PANEL - Abnormal; Notable for the following components:      Result Value   Potassium 2.9 (*)    Calcium 8.8 (*)    Albumin 3.0 (*)    All other components within normal limits  CBC WITH DIFFERENTIAL/PLATELET - Abnormal; Notable for the following components:   RBC 3.55 (*)    Hemoglobin 10.3 (*)    HCT 34.1 (*)    Platelets 143 (*)    Neutro Abs 7.8 (*)    All other components within normal limits  PROTIME-INR - Abnormal; Notable for the following components:   Prothrombin Time 17.2 (*)    INR 1.4 (*)    All other components within normal limits  BRAIN NATRIURETIC PEPTIDE - Abnormal; Notable for the following components:   B Natriuretic Peptide 212.5 (*)    All other components within normal limits  CBG MONITORING, ED - Abnormal; Notable for the following components:   Glucose-Capillary 105 (*)    All other components within normal limits  I-STAT CHEM 8, ED - Abnormal; Notable for the following components:   Calcium, Ion 1.05 (*)    Hemoglobin 11.6 (*)    HCT 34.0 (*)    All other components within normal limits  TROPONIN I (HIGH SENSITIVITY) - Abnormal; Notable for the following components:   Troponin I (High Sensitivity) 68 (*)    All other components within normal limits  CULTURE, BLOOD (ROUTINE X 2)   CULTURE, BLOOD (ROUTINE X 2)  URINALYSIS, W/ REFLEX TO CULTURE (INFECTION SUSPECTED)  MAGNESIUM  I-STAT CG4 LACTIC ACID, ED  CBG MONITORING, ED  CBG MONITORING, ED  I-STAT CG4 LACTIC ACID, ED  CBG MONITORING, ED  CBG MONITORING, ED  TROPONIN I (HIGH SENSITIVITY)    EKG EKG Interpretation Date/Time:  Saturday November 17 2023 10:57:21 EST Ventricular Rate:  58 PR Interval:    QRS Duration:  117 QT Interval:  525 QTC Calculation: 525 R Axis:   -6  Text Interpretation: Atrial fibrillation Probable LVH with secondary repol abnrm Prolonged QT interval Confirmed by Gloris Manchester ([id number]) on 11/17/2023 11:27:45 AM  Radiology CT Head Wo Contrast  Result Date: 11/17/2023 CLINICAL DATA:  Mental status change of unknown cause. Hypoglycemia. Diabetic. EXAM: CT HEAD WITHOUT CONTRAST TECHNIQUE: Contiguous axial images were obtained from the base of the skull through the vertex without intravenous contrast. RADIATION DOSE REDUCTION: This exam was performed according to the departmental dose-optimization program which includes automated exposure control, adjustment of the mA and/or kV according to patient size and/or use of iterative reconstruction technique. COMPARISON:  10/31/2021 CT.  08/05/2021 MRI. FINDINGS: Brain: Advanced chronic ischemic changes of the brainstem. No acute posterior fossa insult identified. Cerebral hemispheres show mild chronic small-vessel ischemic changes of the white matter. No sign of acute stroke, mass, hemorrhage, hydrocephalus or extra-axial collection. Vascular: There is atherosclerotic calcification of the major vessels at the base of the brain. Skull: Negative Sinuses/Orbits: Clear/normal Other: None IMPRESSION: No acute CT finding. Advanced chronic ischemic changes of the brainstem. Mild chronic small-vessel ischemic changes of the cerebral hemispheric white matter. Electronically Signed   By: Paulina Fusi M.D.   On: 11/17/2023 13:02   DG Chest Port 1 View  Result  Date: 11/17/2023 CLINICAL DATA:  Questionable  sepsis - evaluate for abnormality. EXAM: PORTABLE CHEST 1 VIEW COMPARISON:  09/10/2023. FINDINGS: Low lung volume. There is diffuse pulmonary vascular congestion. There are probable atelectatic changes at the lung bases. No acute consolidation or lung collapse. Bilateral lateral costophrenic angles are clear. Stable mildly enlarged cardio-mediastinal silhouette. There is a left sided 2-lead pacemaker. Presumed loop recorder device noted overlying the left lower chest wall. No acute osseous abnormalities. The soft tissues are within normal limits. IMPRESSION: *Probable mild congestive heart failure/pulmonary edema. Electronically Signed   By: Jules Schick M.D.   On: 11/17/2023 11:30    Procedures Procedures    Medications Ordered in ED Medications  dextrose 5 % and 0.9 % NaCl with KCl 20 mEq/L infusion ( Intravenous New Bag/Given 11/17/23 1306)  levothyroxine (SYNTHROID) tablet 175 mcg (has no administration in time range)  pantoprazole (PROTONIX) EC tablet 40 mg (has no administration in time range)  apixaban (ELIQUIS) tablet 5 mg (has no administration in time range)  sodium chloride flush (NS) 0.9 % injection 3 mL (has no administration in time range)  acetaminophen (TYLENOL) tablet 650 mg (has no administration in time range)    Or  acetaminophen (TYLENOL) suppository 650 mg (has no administration in time range)  polyethylene glycol (MIRALAX / GLYCOLAX) packet 17 g (has no administration in time range)  sorbitol 70 % solution 30 mL (has no administration in time range)  cefTRIAXone (ROCEPHIN) 1 g in sodium chloride 0.9 % 100 mL IVPB (0 g Intravenous Stopped 11/17/23 1216)  azithromycin (ZITHROMAX) 500 mg in sodium chloride 0.9 % 250 mL IVPB (0 mg Intravenous Stopped 11/17/23 1321)  potassium chloride SA (KLOR-CON M) CR tablet 40 mEq (40 mEq Oral Given 11/17/23 1255)  furosemide (LASIX) injection 40 mg (40 mg Intravenous Given 11/17/23 1418)     ED Course/ Medical Decision Making/ A&P                                 Medical Decision Making Amount and/or Complexity of Data Reviewed Labs: ordered. Radiology: ordered. ECG/medicine tests: ordered.  Risk Prescription drug management. Decision regarding hospitalization.   This patient presents to the ED for concern of altered mental status, this involves an extensive number of treatment options, and is a complaint that carries with it a high risk of complications and morbidity.  The differential diagnosis includes hypoglycemia, infection, other metabolic derangements, aspiration   Co morbidities that complicate the patient evaluation  DM, OSA, GERD, CHF, CVA, thyroid tumor, anemia, arthritis, heart block s/p Medtronic pacemaker   Additional history obtained:  Additional history obtained from EMS External records from outside source obtained and reviewed including EMR   Lab Tests:  I Ordered, and personally interpreted labs.  The pertinent results include: Normal creatinine, hypokalemia with otherwise normal electrolytes, mild elevation in troponin, baseline anemia, no leukocytosis, moderate elevation in BNP.   Imaging Studies ordered:  I ordered imaging studies including chest x-ray, CT head I independently visualized and interpreted imaging which showed no acute findings on CT head.  X-ray concerning for pulmonary edema and/or pneumonia I agree with the radiologist interpretation   Cardiac Monitoring: / EKG:  The patient was maintained on a cardiac monitor.  I personally viewed and interpreted the cardiac monitored which showed an underlying rhythm of: Sinus rhythm  Problem List / ED Course / Critical interventions / Medication management  Patient presenting after being found unresponsive at home.  EMS noted hypoglycemia  with CBG in the 40s.  This respond to glucagon and D10 prior to arrival.  On arrival, patient is awake and alert.  She is disoriented and  does seem to have slowed movements and responses.  On exam, breathing is unlabored.  She has mild coarse breath sounds on lung auscultation.  She is on 2 L of supplemental oxygen at baseline.  She was noted to be hypoxic at 86% on her 2 L with EMS.  Supplemental oxygen was increased to 6 L.  She has a cough that she states is new today.  There is concern of aspiration.  Patient has some left arm weakness.  She states this is chronic.  Patient was placed on bedside cardiac monitor.  Workup was initiated.  X-ray shows concern of right lower lobe opacity.  Patient was treated empirically for pneumonia/or aspiration with antibiotics.  Her family arrived at bedside.  They state that patient is currently in her normal mental status.  Lab work is notable for hypokalemia.  Replacing potassium was ordered.  Patient had downtrending blood glucose while in the ED.  Dextrose containing fluids were initiated.  BNP is mildly elevated.  She is on Lasix at baseline.  Dose of Lasix was ordered.  Patient was able to be weaned down to 2 L of supplemental oxygen.  She was admitted for further management. I ordered medication including potassium chloride for hypokalemia; ceftriaxone and azithromycin for empiric treatment of pneumonia and/for aspiration; D5 normal saline for hypoglycemia; Lasix for diuresis Reevaluation of the patient after these medicines showed that the patient improved I have reviewed the patients home medicines and have made adjustments as needed   Social Determinants of Health:  Lives at home with family        Final Clinical Impression(s) / ED Diagnoses Final diagnoses:  Hypoglycemia  Acute on chronic respiratory failure with hypoxia (HCC)  Hypokalemia    Rx / DC Orders ED Discharge Orders     None         Gloris Manchester, MD 11/17/23 1424

## 2023-11-18 ENCOUNTER — Observation Stay (HOSPITAL_COMMUNITY): Payer: Medicare Other

## 2023-11-18 DIAGNOSIS — R918 Other nonspecific abnormal finding of lung field: Secondary | ICD-10-CM | POA: Diagnosis not present

## 2023-11-18 DIAGNOSIS — R0989 Other specified symptoms and signs involving the circulatory and respiratory systems: Secondary | ICD-10-CM | POA: Diagnosis not present

## 2023-11-18 DIAGNOSIS — J811 Chronic pulmonary edema: Secondary | ICD-10-CM | POA: Diagnosis not present

## 2023-11-18 DIAGNOSIS — I517 Cardiomegaly: Secondary | ICD-10-CM | POA: Diagnosis not present

## 2023-11-18 DIAGNOSIS — J9621 Acute and chronic respiratory failure with hypoxia: Secondary | ICD-10-CM | POA: Diagnosis not present

## 2023-11-18 LAB — GLUCOSE, CAPILLARY
Glucose-Capillary: 198 mg/dL — ABNORMAL HIGH (ref 70–99)
Glucose-Capillary: 203 mg/dL — ABNORMAL HIGH (ref 70–99)
Glucose-Capillary: 207 mg/dL — ABNORMAL HIGH (ref 70–99)
Glucose-Capillary: 214 mg/dL — ABNORMAL HIGH (ref 70–99)
Glucose-Capillary: 215 mg/dL — ABNORMAL HIGH (ref 70–99)
Glucose-Capillary: 227 mg/dL — ABNORMAL HIGH (ref 70–99)

## 2023-11-18 LAB — HEMOGLOBIN A1C
Hgb A1c MFr Bld: 7 % — ABNORMAL HIGH (ref 4.8–5.6)
Mean Plasma Glucose: 154.2 mg/dL

## 2023-11-18 LAB — COMPREHENSIVE METABOLIC PANEL
ALT: 23 U/L (ref 0–44)
AST: 20 U/L (ref 15–41)
Albumin: 2.7 g/dL — ABNORMAL LOW (ref 3.5–5.0)
Alkaline Phosphatase: 60 U/L (ref 38–126)
Anion gap: 11 (ref 5–15)
BUN: 16 mg/dL (ref 8–23)
CO2: 23 mmol/L (ref 22–32)
Calcium: 8.3 mg/dL — ABNORMAL LOW (ref 8.9–10.3)
Chloride: 104 mmol/L (ref 98–111)
Creatinine, Ser: 1.29 mg/dL — ABNORMAL HIGH (ref 0.44–1.00)
GFR, Estimated: 44 mL/min — ABNORMAL LOW (ref >60)
Glucose, Bld: 245 mg/dL — ABNORMAL HIGH (ref 70–99)
Potassium: 3.9 mmol/L (ref 3.5–5.1)
Sodium: 138 mmol/L (ref 135–145)
Total Bilirubin: 1.1 mg/dL (ref <1.2)
Total Protein: 6 g/dL — ABNORMAL LOW (ref 6.5–8.1)

## 2023-11-18 LAB — CBC
HCT: 31.5 % — ABNORMAL LOW (ref 36.0–46.0)
Hemoglobin: 9.9 g/dL — ABNORMAL LOW (ref 12.0–15.0)
MCH: 29.6 pg (ref 26.0–34.0)
MCHC: 31.4 g/dL (ref 30.0–36.0)
MCV: 94.3 fL (ref 80.0–100.0)
Platelets: 126 10*3/uL — ABNORMAL LOW (ref 150–400)
RBC: 3.34 MIL/uL — ABNORMAL LOW (ref 3.87–5.11)
RDW: 13 % (ref 11.5–15.5)
WBC: 8.8 10*3/uL (ref 4.0–10.5)
nRBC: 0 % (ref 0.0–0.2)

## 2023-11-18 MED ORDER — FUROSEMIDE 40 MG PO TABS
40.0000 mg | ORAL_TABLET | Freq: Two times a day (BID) | ORAL | Status: DC
  Administered 2023-11-18 – 2023-11-20 (×4): 40 mg via ORAL
  Filled 2023-11-18 (×4): qty 1

## 2023-11-18 MED ORDER — INSULIN ASPART 100 UNIT/ML IJ SOLN
0.0000 [IU] | INTRAMUSCULAR | Status: DC
  Administered 2023-11-18: 2 [IU] via SUBCUTANEOUS
  Administered 2023-11-18: 1 [IU] via SUBCUTANEOUS
  Administered 2023-11-18 (×3): 2 [IU] via SUBCUTANEOUS
  Administered 2023-11-19: 1 [IU] via SUBCUTANEOUS
  Administered 2023-11-19: 2 [IU] via SUBCUTANEOUS
  Administered 2023-11-19: 3 [IU] via SUBCUTANEOUS
  Administered 2023-11-19: 1 [IU] via SUBCUTANEOUS
  Administered 2023-11-19: 2 [IU] via SUBCUTANEOUS
  Administered 2023-11-19: 3 [IU] via SUBCUTANEOUS
  Administered 2023-11-20 (×2): 1 [IU] via SUBCUTANEOUS

## 2023-11-18 MED ORDER — BISACODYL 10 MG RE SUPP
10.0000 mg | Freq: Once | RECTAL | Status: AC | PRN
  Administered 2023-11-18: 10 mg via RECTAL
  Filled 2023-11-18: qty 1

## 2023-11-18 NOTE — Plan of Care (Signed)
  Problem: Education: Goal: Knowledge of General Education information will improve Description: Including pain rating scale, medication(s)/side effects and non-pharmacologic comfort measures Outcome: Progressing   Problem: Health Behavior/Discharge Planning: Goal: Ability to manage health-related needs will improve Outcome: Progressing   Problem: Clinical Measurements: Goal: Ability to maintain clinical measurements within normal limits will improve Outcome: Progressing Goal: Will remain free from infection Outcome: Progressing Goal: Diagnostic test results will improve Outcome: Progressing Goal: Respiratory complications will improve Outcome: Progressing Goal: Cardiovascular complication will be avoided Outcome: Progressing   Problem: Nutrition: Goal: Adequate nutrition will be maintained Outcome: Progressing   Problem: Elimination: Goal: Will not experience complications related to bowel motility Outcome: Progressing Goal: Will not experience complications related to urinary retention Outcome: Progressing   Problem: Pain Management: Goal: General experience of comfort will improve Outcome: Progressing   Problem: Safety: Goal: Ability to remain free from injury will improve Outcome: Progressing   Problem: Skin Integrity: Goal: Risk for impaired skin integrity will decrease Outcome: Progressing   Problem: Activity: Goal: Risk for activity intolerance will decrease Outcome: Not Progressing   Problem: Coping: Goal: Level of anxiety will decrease Outcome: Not Progressing

## 2023-11-18 NOTE — Progress Notes (Signed)
Patient reports feeling nauseated. No prns ordered. MD messaged via secure chat.

## 2023-11-18 NOTE — Evaluation (Addendum)
Clinical/Bedside Swallow Evaluation Patient Details  Name: [name] MRN: [id number] Date of Birth: 14-Jan-1951  Today's Date: 11/18/2023 Time: SLP Start Time (ACUTE ONLY): 1230 SLP Stop Time (ACUTE ONLY): 1249 SLP Time Calculation (min) (ACUTE ONLY): 19 min  Past Medical History:  Past Medical History:  Diagnosis Date   Abnormal echocardiogram    Anemia    Arthritis    BPPV (benign paroxysmal positional vertigo), right 06/13/2017   Bradycardia 03/07/2020   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   CHB (complete heart block) (HCC) 03/08/2020   CHF (congestive heart failure) (HCC)    Closed fracture of left proximal humerus 07/08/2019   Decreased range of motion of left shoulder 09/03/2020   Depression    Dizzy 03/07/2020   bradycardia   Edentulous 09/03/2020   GERD (gastroesophageal reflux disease)    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  Severe chronic microvascular ischemic disease of the pons, left middle cerebellar peduncle, and mini remote lacunar infarcts, progressive since 2019.  Remote pontine microhemorrhage most likely of hypertensive etiology.  10/10/21 CT reveals chronic atrophic changes.     Malignant tumor of thyroid gland (HCC) 03/27/2022   Near syncope 03/08/2020   Paroxysmal atrial fibrillation (HCC)    Presence of permanent cardiac pacemaker    Proteinuria 03/08/2020   S/P placement of cardiac pacemaker  Medtronic Azure XT DR MRI 06/25/20 06/26/2020   Seasonal allergies    Stroke (HCC)    Suicidal ideation 11/16/2021   Thyroid cancer (HCC)    per pt S/p Total Thyroidectomy with Radioactive Iodine Therapy   Type 1 diabetes mellitus (HCC)    Past Surgical History:  Past Surgical History:  Procedure Laterality Date   ABDOMINAL ANGIOGRAM N/A 05/14/2012   Procedure: ABDOMINAL ANGIOGRAM;  Surgeon: [name], MD;  Location: [facility] CATH LAB;  Service: Cardiovascular;  Laterality: N/A;   ABDOMINAL HYSTERECTOMY  1995   partial   ANKLE FRACTURE SURGERY Right    CARDIAC CATHETERIZATION     with coronary angiogram   cataract  Bilateral 2018   COLONOSCOPY N/A 09/09/2014   Procedure: COLONOSCOPY;  Surgeon: Iva Boop, MD;  Location: [facility] ENDOSCOPY;  Service: Endoscopy;  Laterality: N/A;   ESOPHAGOGASTRODUODENOSCOPY (EGD) WITH PROPOFOL N/A 12/03/2018   Procedure: ESOPHAGOGASTRODUODENOSCOPY (EGD) WITH PROPOFOL;  Surgeon: Sherrilyn Rist, MD;  Location: [facility] ENDOSCOPY;  Service: Gastroenterology;  Laterality: N/A;   LEFT AND RIGHT HEART CATHETERIZATION WITH CORONARY ANGIOGRAM N/A 05/14/2012   Procedure: LEFT AND RIGHT HEART CATHETERIZATION WITH CORONARY ANGIOGRAM;  Surgeon: [name], MD;  Location: [facility] CATH LAB;  Service: Cardiovascular;  Laterality: N/A;   LOOP RECORDER INSERTION N/A 12/23/2018   Procedure: LOOP RECORDER INSERTION;  Surgeon: Regan Lemming, MD;  Location: [facility] INVASIVE CV LAB;  Service: Cardiovascular;  Laterality: N/A;   LOOP RECORDER REMOVAL N/A 06/25/2020   Procedure: LOOP RECORDER REMOVAL;  Surgeon: Regan Lemming, MD;  Location: [facility] INVASIVE CV LAB;  Service: Cardiovascular;  Laterality: N/A;   MINI METER   09/08/2014   ELECTRONIC INSULIN PUMP   PACEMAKER IMPLANT N/A 06/25/2020   Procedure: PACEMAKER IMPLANT;  Surgeon: Regan Lemming, MD;  Location: [facility] INVASIVE CV LAB;  Service: Cardiovascular;  Laterality: N/A;   TEE WITHOUT CARDIOVERSION N/A  10/16/2018   Procedure: TRANSESOPHAGEAL ECHOCARDIOGRAM (TEE);  Surgeon: Jodelle Red, MD;  Location: [facility] ENDOSCOPY;  Service: Cardiovascular;  Laterality: N/A;   THYROIDECTOMY  2006   for thyroid cancer   HPI:  [name] is a 72 yo female presenting to ED 11/23 with hypoglycemia, AMS, and worsening hypoxia. CTH negative for acute abnormalities. Most recently seen by SLP 01/2022 and recommended Dys 2 diet with thin liquids with known risk of aspiration after declining to use thickened liquids. MBS 11/2021 with aspiration of thin liquids due to mistiming and was recommended Dys 3 solids with nectar thick liquids. recommended PMH includes HTN, HLD, GERD, T1DM, A-fib, complete heart block s/p pacemaker, hypothyroidism, CVA with residual L hemiparesis and wheelchair dependence, MCI, depression, OSA, diastolic CHF, obesity, lower back pain, chronic hypoxia requiring 1L O2 at baseline    Assessment / Plan / Recommendation  Clinical Impression  Pt reports she returned to consuming a diet of regular texture solids with thin liquids due to not enjoying previously recommended thickened liquids. She states that her primary goal this admission is to feel better and is now willing to trial thickened liquids. Oral motor exam WFL. Note pt reports increased secretions that appear blood-tinged. RN notified. Observed pt with trials of thin liquids, which resulted in immediate coughing in all opportunities. Clinically, nectar thick liquids eliminated s/s of aspiration. Trials of purees and solids were overall WFL, although pt required her regular texture tray to be cut for her. Discussed options for diet modification and provided education regarding POC with pt and her family. Recommend downgrading diet to Dys 3 texture solids and nectar thick liquids pending completion of an instrumental swallow study. SLP will f/u as scheduling allows to complete MBS. SLP Visit Diagnosis: Dysphagia, unspecified (R13.10)     Aspiration Risk  Moderate aspiration risk    Diet Recommendation Dysphagia 3 (Mech soft);Nectar-thick liquid    Liquid Administration via: Straw;Cup Medication Administration: Crushed with puree Supervision: Staff to assist with self feeding;Full supervision/cueing for compensatory strategies Compensations: Minimize environmental distractions;Slow rate;Small sips/bites;Lingual sweep for clearance of pocketing Postural Changes: Seated upright at 90 degrees    Other  Recommendations Oral Care Recommendations: Oral care QID;Staff/trained caregiver to provide oral care Caregiver Recommendations: Avoid jello, ice cream, thin soups, popsicles;Remove water pitcher    Recommendations for follow up therapy are one component of a multi-disciplinary discharge planning process, led by the attending physician.  Recommendations may be updated based on patient status, additional functional criteria and insurance authorization.  Follow up Recommendations Home health SLP      Assistance Recommended at Discharge    Functional Status Assessment Patient has had a recent decline in their functional status and demonstrates the ability to make significant improvements in function in a reasonable and predictable amount of time.  Frequency and Duration min 2x/week  2 weeks       Prognosis Prognosis for improved oropharyngeal function: Good Barriers to Reach Goals: Cognitive deficits;Time post onset;Severity of deficits      Swallow Study   General HPI: Ritu Padberg is a 72 yo female presenting to ED 11/23 with hypoglycemia, AMS, and worsening hypoxia. CTH negative for acute abnormalities.  Most recently seen by SLP 01/2022 and recommended Dys 2 diet with thin liquids with known risk of aspiration after declining to use thickened liquids. MBS 11/2021 with aspiration of thin liquids due to mistiming and was recommended Dys 3 solids with nectar thick liquids. recommended PMH includes HTN, HLD, GERD,  T1DM, A-fib, complete heart block s/p pacemaker, hypothyroidism, CVA with residual L hemiparesis and wheelchair dependence, MCI, depression, OSA, diastolic CHF, obesity, lower back pain, chronic hypoxia requiring 1L O2 at baseline Type of Study: Bedside Swallow Evaluation Previous Swallow Assessment: see HPI Diet Prior to this Study: Regular;Thin liquids (Level 0) Temperature Spikes Noted: No Respiratory Status: Nasal cannula History of Recent Intubation: No Behavior/Cognition: Alert;Cooperative;Pleasant mood Oral Cavity Assessment: Within Functional Limits Oral Care Completed by SLP: No Oral Cavity - Dentition: Dentures, top;Dentures, bottom Vision: Functional for self-feeding Self-Feeding Abilities: Able to feed self Patient Positioning: Upright in bed Baseline Vocal Quality: Normal Volitional Cough: Strong Volitional Swallow: Able to elicit    Oral/Motor/Sensory Function Overall Oral Motor/Sensory Function: Within functional limits   Ice Chips Ice chips: Not tested   Thin Liquid Thin Liquid: Impaired Presentation: Straw;Self Fed Pharyngeal  Phase Impairments: Cough - Immediate;Wet Vocal Quality    Nectar Thick Nectar Thick Liquid: Within functional limits Presentation: Straw;Self Fed   Honey Thick Honey Thick Liquid: Not tested   Puree Puree: Within functional limits Presentation: Spoon;Self Fed   Solid     Solid: Within functional limits Presentation: Spoon;Self Fed      Gwynneth Aliment, M.A., CF-SLP Speech Language Pathology, Acute Rehabilitation Services  Secure Chat preferred [phone]  11/18/2023,12:59 PM

## 2023-11-18 NOTE — Care Management Obs Status (Signed)
MEDICARE OBSERVATION STATUS NOTIFICATION   Patient Details  Name: [name] MRN: [id number] Date of Birth: [birth year]   Medicare Observation Status Notification Given:  Yes    Lawerance Sabal, RN 11/18/2023, 2:01 PM

## 2023-11-18 NOTE — Progress Notes (Signed)
PROGRESS NOTE    [name]  ZOX:[id number] DOB: May 21, 1951 DOA: 11/17/2023 PCP: Irven Coe, MD   Brief Narrative:  This 72 yrs old female with PMH significant of hypertension, hyperlipidemia, GERD, type 1 diabetes, atrial fibrillation, complete heart block status post pacemaker, hypothyroidism, CVA with residual left hemiparesis and wheelchair dependence, MCI, depression with history of suicidal ideations, obesity, OSA, diastolic CHF, low back pain, chronic hypoxia presented in the ED with hypoglycemia, altered mental status, worsening hypoxia.  Patient reportedly took insulin at around 4 AM.  Family is unsure why she took it but believes that her glucometer may have had high reading.  Later family found patient was unresponsive.  When EMS arrived they noticed patient with candy bar in her mouth possibly trying to self-correct.  CBG was 42 when EMS checked,  She was given glucagon with improvement in her glucose but continued to remain unresponsive.  She has received D10 infusion en route to the ED.  She became more responsive on arrival in the ED.  She was noted to be hypoxic on room air requiring up to 6 L of supplemental oxygen on arrival and then weaned down to 2 L.  Significant labs in the ED potassium 2.9,  INR 1.2, BNP 212, troponin 68.  Lactic acid - x 2. Chest x-ray shows mild pulmonary edema/CHF.  CT head negative.  Patient empirically started on ceftriaxone, Zithromax and was given a dose of IV Lasix and started on D5 infusion.  Patient is admitted for further evaluation.  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)   Hypothyroidism, postsurgical   GERD (gastroesophageal reflux disease)   Morbid obesity (HCC)   Depression   Insulin  pump in place   OSA (obstructive sleep apnea)   Paroxysmal atrial fibrillation (HCC)   Dependent for wheelchair mobility   Complete heart block (HCC)   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 06/25/20   Chronic lower back pain   Mild cognitive impairment   Essential hypertension   History of CVA (cerebrovascular accident)   Obesity hypoventilation syndrome (HCC)   Hypoglycemia  Type 1 diabetes with hypoglycemia: Patient presented following hypoglycemic event at home.   Patient was found unresponsive and required glucagon followed by D10 to improve responsiveness and blood sugar. Blood sugar again drifted down in the ED and was started on D5 infusion. Mentation has improved to baseline, will continue to require D5 infusion. Monitor CBGs. Hold home insulin.  Acute on chronic hypoxic respiratory failure: Could be multifactorial due to aspiration pneumonia, CHF. Patient uses around 1 L of supplemental oxygen at baseline at night. Chronic respiratory failure believed to be due to combination of OHS, pulmonary hypertension due to undertreated OSA as Patient is not compliant with BiPAP. Initially required 6 L of supplemental oxygen and has been weaned down to 2 L. Chest x-ray read as probable pulmonary edema though concern for possible aspiration component given she was found with some food in her mouth per reports. Initiated on  antibiotics ceftriaxone and Zithromax. Patient has received a dose of Lasix in the ED. Refuse to use BiPAP,  Continue supplemental oxygen only Last echo was in March with EF 60-65%, indeterminate diastolic function, normal RV function. Hold off on further antibiotics as there  is no leukocytosis and if this is pneumonitis alone may not require an antibiotics, Continue Lasix 40 mg daily.   Hypertension Holding amlodipine, Lasix, losartan, metoprolol given low normal blood pressure.   GERD Continue pantoprazole 40 mg daily.   Diabetes  mellitus I: Hypoglycemia as above.  No longer on insulin pump. At home She uses 20 units long-acting daily and SSI Hold Home insulin If persistently elevated glucose on D5 infusion, will discontinue this and start SSI   Atrial fibrillation with SVR: Holding metoprolol due to low normal blood pressure, resume tomorrow Continued on Eliquis.   Complete heart block: Status post pacemaker.  Several paced beats on EKG in ED. Continue to monitor   Prolonged Qtc: Read as prolonged on EKG in the ED, but unclear if A-fib/paced rhythm may be interfering.  Will recheck EKG now and in the morning. Avoid QTc prolonging medications in the meantime and let us repeat EKG shows improvement   Hypothyroidism: Continue Synthroid.   History of CVA: Residual left hemiparesis and wheelchair dependence. Continue Eliquis   Mild cognitive impairment Depression: History of suicidal ideation per chart. Denies any recurrence suicidal ideation.   Obesity Diet discussed in detail.  Hemoptysis: Patient coughed up small amount of blood denies any chest pain. Continue Eliquis.  It could be secondary to pneumonitis.   DVT prophylaxis: Eliquis on Hold, SCDs Code Status: Full code Family Communication:No family at bed side. Disposition Plan:    Status is: Observation The patient remains OBS appropriate and will d/c before 2 midnights.   Admitted for recurrent hypoglycemia despite being on D10 W,  also found to have acute on chronic hypoxic respiratory failure likely aspiration pneumonia.   Consultants:  None  Procedures:   Antimicrobials: Anti-infectives (From admission, onward)    Start     Dose/Rate Route Frequency Ordered Stop   11/17/23 1130  cefTRIAXone (ROCEPHIN) 1 g in sodium chloride 0.9 % 100 mL IVPB        1 g 200 mL/hr over 30 Minutes Intravenous  Once 11/17/23 1128 11/17/23 1216   11/17/23 1130  azithromycin (ZITHROMAX) 500 mg in sodium chloride 0.9 % 250 mL IVPB        500  mg 250 mL/hr over 60 Minutes Intravenous  Once 11/17/23 1128 11/17/23 1321       Subjective: Patient was seen and examined at bedside.  Overnight events noted.  Patient reports doing much better. She reports coughing up small amount of blood.  Denies any chest pain,  neck pain or shortness of breath.   She remains on baseline oxygen requirement.  Objective: Vitals:   11/17/23 1919 11/18/23 0024 11/18/23 0414 11/18/23 0800  BP: (!) 140/45 (!) 136/99 (!) 128/46 (!) 118/50  Pulse: 64 (!) 46  (!) 53  Resp: 20  19 (!) 22  Temp: 98.7 F (37.1 C) 98.4 F (36.9 C) 98.7 F (37.1 C) 97.9 F (36.6 C)  TempSrc: Oral Oral Oral Oral  SpO2: 90% 95%  96%  Weight:   115.4 kg   Height:        Intake/Output Summary (Last 24 hours) at 11/18/2023 1142 Last data filed at 11/18/2023 0906 Gross per 24 hour  Intake 200 ml  Output 350 ml  Net -150 ml   Filed Weights   11/17/23 1049 11/18/23 0414  Weight: 104.3 kg 115.4 kg    Examination:  General exam: Appears calm and comfortable , deconditioned, not in any acute distress. Respiratory system: CTA bilaterally. Respiratory effort normal.  RR 16 Cardiovascular system: S1 & S2 heard, Irregular rhythm, no murmur, PPM noted,. No pedal edema. Gastrointestinal system: Abdomen is nondistended, soft and nontender.  Normal bowel sounds heard. Central nervous system: Alert and oriented x 3.  Left-sided weakness noted Extremities: No edema, no cyanosis, no clubbing Skin: No rashes, lesions or ulcers Psychiatry: Judgement and insight appear normal. Mood & affect appropriate.     Data Reviewed: I have personally reviewed following labs and imaging studies  CBC: Recent Labs  Lab 11/17/23 1103 11/17/23 1126 11/18/23 0328  WBC 8.9  --  8.8  NEUTROABS 7.8*  --   --   HGB 10.3* 11.6* 9.9*  HCT 34.1* 34.0* 31.5*  MCV 96.1  --  94.3  PLT 143*  --  126*   Basic Metabolic Panel: Recent Labs  Lab 11/17/23 1103 11/17/23 1126 11/18/23 0328  NA  140 142 138  K 2.9* 3.9 3.9  CL 104 105 104  CO2 24  --  23  GLUCOSE 81 78 245*  BUN 15 22 16   CREATININE 0.95 1.00 1.29*  CALCIUM 8.8*  --  8.3*  MG 1.9  --   --    GFR: Estimated Creatinine Clearance: 51.7 mL/min (A) (by C-G formula based on SCr of 1.29 mg/dL (H)). Liver Function Tests: Recent Labs  Lab 11/17/23 1103 11/18/23 0328  AST 26 20  ALT 29 23  ALKPHOS 71 60  BILITOT 0.5 1.1  PROT 6.6 6.0*  ALBUMIN 3.0* 2.7*   No results for input(s): "LIPASE", "AMYLASE" in the last 168 hours. No results for input(s): "AMMONIA" in the last 168 hours. Coagulation Profile: Recent Labs  Lab 11/17/23 1103  INR 1.4*   Cardiac Enzymes: No results for input(s): "CKTOTAL", "CKMB", "CKMBINDEX", "TROPONINI" in the last 168 hours. BNP (last 3 results) No results for input(s): "PROBNP" in the last 8760 hours. HbA1C: Recent Labs    11/18/23 0328  HGBA1C 7.0*   CBG: Recent Labs  Lab 11/17/23 1952 11/17/23 2207 11/18/23 0021 11/18/23 0412 11/18/23 0804  GLUCAP 204* 226* 215* 203* 198*   Lipid Profile: No results for input(s): "CHOL", "HDL", "LDLCALC", "TRIG", "CHOLHDL", "LDLDIRECT" in the last 72 hours. Thyroid Function Tests: No results for input(s): "TSH", "T4TOTAL", "FREET4", "T3FREE", "THYROIDAB" in the last 72 hours. Anemia Panel: No results for input(s): "VITAMINB12", "FOLATE", "FERRITIN", "TIBC", "IRON", "RETICCTPCT" in the last 72 hours. Sepsis Labs: Recent Labs  Lab 11/17/23 1127 11/17/23 1322  LATICACIDVEN 1.5 1.9    Recent Results (from the past 240 hour(s))  Blood Culture (routine x 2)     Status: None (Preliminary result)   Collection Time: 11/17/23 11:03 AM   Specimen: BLOOD LEFT HAND  Result Value Ref Range Status   Specimen Description BLOOD LEFT HAND  Final   Special Requests   Final    BOTTLES DRAWN AEROBIC AND ANAEROBIC Blood Culture adequate volume   Culture   Final    NO GROWTH < 24 HOURS Performed at [facility], [address] [place], [place] [address]    Report Status PENDING  Incomplete  Blood Culture (routine x 2)     Status: None (Preliminary result)   Collection Time: 11/17/23 11:03 AM   Specimen: BLOOD RIGHT FOREARM  Result Value Ref Range Status   Specimen Description BLOOD RIGHT FOREARM  Final   Special Requests   Final    BOTTLES DRAWN AEROBIC AND ANAEROBIC Blood Culture adequate volume   Culture   Final    NO  GROWTH < 24 HOURS Performed at [facility], [address] [place], [place] [address]    Report Status PENDING  Incomplete    Radiology Studies: CT Head Wo Contrast  Result Date: 11/17/2023 CLINICAL DATA:  Mental status change of unknown cause. Hypoglycemia. Diabetic. EXAM: CT HEAD WITHOUT CONTRAST TECHNIQUE: Contiguous axial images were obtained from the base of the skull through the vertex without intravenous contrast. RADIATION DOSE REDUCTION: This exam was performed according to the departmental dose-optimization program which includes automated exposure control, adjustment of the mA and/or kV according to patient size and/or use of iterative reconstruction technique. COMPARISON:  10/31/2021 CT.  08/05/2021 MRI. FINDINGS: Brain: Advanced chronic ischemic changes of the brainstem. No acute posterior fossa insult identified. Cerebral hemispheres show mild chronic small-vessel ischemic changes of the white matter. No sign of acute stroke, mass, hemorrhage, hydrocephalus or extra-axial collection. Vascular: There is atherosclerotic calcification of the major vessels at the base of the brain. Skull: Negative Sinuses/Orbits: Clear/normal Other: None IMPRESSION: No acute CT finding. Advanced chronic ischemic changes of the brainstem. Mild chronic small-vessel ischemic changes of the cerebral hemispheric white matter. Electronically Signed   By: Paulina Fusi M.D.   On: 11/17/2023 13:02   DG Chest Port 1 View  Result Date: 11/17/2023 CLINICAL DATA:  Questionable sepsis - evaluate for abnormality.  EXAM: PORTABLE CHEST 1 VIEW COMPARISON:  09/10/2023. FINDINGS: Low lung volume. There is diffuse pulmonary vascular congestion. There are probable atelectatic changes at the lung bases. No acute consolidation or lung collapse. Bilateral lateral costophrenic angles are clear. Stable mildly enlarged cardio-mediastinal silhouette. There is a left sided 2-lead pacemaker. Presumed loop recorder device noted overlying the left lower chest wall. No acute osseous abnormalities. The soft tissues are within normal limits. IMPRESSION: *Probable mild congestive heart failure/pulmonary edema. Electronically Signed   By: Jules Schick M.D.   On: 11/17/2023 11:30    Scheduled Meds:  apixaban  5 mg Oral BID   furosemide  40 mg Oral BID   insulin aspart  0-6 Units Subcutaneous Q4H   levothyroxine  175 mcg Oral Q0600   metoprolol succinate  50 mg Oral Daily   pantoprazole  40 mg Oral BID   sodium chloride flush  3 mL Intravenous Q12H   Continuous Infusions:   LOS: 0 days    Time spent: 50 Mins    Willeen Niece, MD [facility]   If 7PM-7AM, please contact night-coverage

## 2023-11-19 ENCOUNTER — Observation Stay (HOSPITAL_COMMUNITY): Payer: Medicare Other

## 2023-11-19 DIAGNOSIS — I4891 Unspecified atrial fibrillation: Secondary | ICD-10-CM

## 2023-11-19 DIAGNOSIS — R042 Hemoptysis: Secondary | ICD-10-CM | POA: Diagnosis not present

## 2023-11-19 DIAGNOSIS — J9621 Acute and chronic respiratory failure with hypoxia: Secondary | ICD-10-CM | POA: Diagnosis not present

## 2023-11-19 LAB — BASIC METABOLIC PANEL
Anion gap: 7 (ref 5–15)
BUN: 17 mg/dL (ref 8–23)
CO2: 26 mmol/L (ref 22–32)
Calcium: 8.5 mg/dL — ABNORMAL LOW (ref 8.9–10.3)
Chloride: 104 mmol/L (ref 98–111)
Creatinine, Ser: 1.01 mg/dL — ABNORMAL HIGH (ref 0.44–1.00)
GFR, Estimated: 59 mL/min — ABNORMAL LOW (ref >60)
Glucose, Bld: 154 mg/dL — ABNORMAL HIGH (ref 70–99)
Potassium: 3.6 mmol/L (ref 3.5–5.1)
Sodium: 137 mmol/L (ref 135–145)

## 2023-11-19 LAB — CBC
HCT: 32.1 % — ABNORMAL LOW (ref 36.0–46.0)
Hemoglobin: 10.2 g/dL — ABNORMAL LOW (ref 12.0–15.0)
MCH: 29.5 pg (ref 26.0–34.0)
MCHC: 31.8 g/dL (ref 30.0–36.0)
MCV: 92.8 fL (ref 80.0–100.0)
Platelets: 133 10*3/uL — ABNORMAL LOW (ref 150–400)
RBC: 3.46 MIL/uL — ABNORMAL LOW (ref 3.87–5.11)
RDW: 12.9 % (ref 11.5–15.5)
WBC: 6.6 10*3/uL (ref 4.0–10.5)
nRBC: 0 % (ref 0.0–0.2)

## 2023-11-19 LAB — GLUCOSE, CAPILLARY
Glucose-Capillary: 160 mg/dL — ABNORMAL HIGH (ref 70–99)
Glucose-Capillary: 190 mg/dL — ABNORMAL HIGH (ref 70–99)
Glucose-Capillary: 242 mg/dL — ABNORMAL HIGH (ref 70–99)
Glucose-Capillary: 244 mg/dL — ABNORMAL HIGH (ref 70–99)
Glucose-Capillary: 257 mg/dL — ABNORMAL HIGH (ref 70–99)
Glucose-Capillary: 275 mg/dL — ABNORMAL HIGH (ref 70–99)

## 2023-11-19 LAB — PROCALCITONIN: Procalcitonin: 0.43 ng/mL

## 2023-11-19 LAB — PHOSPHORUS: Phosphorus: 2.4 mg/dL — ABNORMAL LOW (ref 2.5–4.6)

## 2023-11-19 LAB — MAGNESIUM: Magnesium: 1.7 mg/dL (ref 1.7–2.4)

## 2023-11-19 MED ORDER — POTASSIUM & SODIUM PHOSPHATES 280-160-250 MG PO PACK
1.0000 | PACK | Freq: Three times a day (TID) | ORAL | Status: AC
  Administered 2023-11-19 – 2023-11-20 (×3): 1 via ORAL
  Filled 2023-11-19 (×3): qty 1

## 2023-11-19 MED ORDER — ONDANSETRON HCL 4 MG/2ML IJ SOLN
4.0000 mg | Freq: Once | INTRAMUSCULAR | Status: AC
  Administered 2023-11-19: 4 mg via INTRAVENOUS
  Filled 2023-11-19: qty 2

## 2023-11-19 MED ORDER — INSULIN GLARGINE-YFGN 100 UNIT/ML ~~LOC~~ SOLN
12.0000 [IU] | Freq: Every day | SUBCUTANEOUS | Status: DC
  Administered 2023-11-19 – 2023-11-20 (×2): 12 [IU] via SUBCUTANEOUS
  Filled 2023-11-19 (×2): qty 0.12

## 2023-11-19 MED ORDER — TRANEXAMIC ACID FOR INHALATION: 500.0000 mg | Freq: Three times a day (TID) | RESPIRATORY_TRACT | Status: DC | PRN

## 2023-11-19 MED ORDER — MAGNESIUM SULFATE 2 GM/50ML IV SOLN
2.0000 g | Freq: Once | INTRAVENOUS | Status: AC
  Administered 2023-11-19: 2 g via INTRAVENOUS
  Filled 2023-11-19: qty 50

## 2023-11-19 NOTE — Consult Note (Signed)
WOC Nurse Consult Note: Reason for Consult:Abrasions to bilateral knees, elbows.  Patient states was not a fall, but went down gently (at home)  Full thickness unstageable pressure injury to right heel, present on admission.   Wound type: unstageable pressure injury Abrasions knees, elbows and lower leg.  All are resolving and clean, dry and intact.  If draining, can cover with foam dressings.  Are open to air at this time with no intervention needed.  Right heel 2 cm x 1.5 cm nonintact wound with dark, devitalized tissue to wound bed.  Pressure Injury POA: Yes Measurement: see above Wound bed: devitalized tissue to wound bed.  Drainage (amount, consistency, odor) minimal serosanguinous  musty odor Periwound: intact, dry skin  Dressing procedure/placement/frequency: CLeanse right heel wound with VASHE cleanser ([facility] #[id number])  Apply VASHE moist gauze to wound bed.  Cover with dry gauze and kerlix/tape.  Change daily.  Will not follow at this time.  Please re-consult if needed.  Mike Gip MSN, RN, FNP-BC CWON Wound, Ostomy, Continence Nurse Outpatient [facility] [phone] Pager [phone]

## 2023-11-19 NOTE — Progress Notes (Signed)
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  Patient reportedly took insulin at around 4 AM.  Family is unsure why she took it but believes that her glucometer may have had high reading.  Later family found patient was unresponsive.  When EMS arrived they noticed patient with candy bar in her mouth possibly trying to self-correct.  CBG was 42 when EMS checked,  She was given glucagon with improvement in her glucose but continued to remain unresponsive.  She has received D10 infusion en route to the ED.  She became more responsive on arrival in the ED.  She was noted to be hypoxic on room air requiring up to 6 L of supplemental oxygen on arrival and then weaned down to 2 L.  Significant labs in the ED potassium 2.9,  INR 1.2, BNP 212, troponin 68.  Lactic acid - x 2. Chest x-ray shows mild pulmonary edema/CHF.  CT head negative.  Patient empirically started on ceftriaxone, Zithromax and was given a dose of IV Lasix and started on D5 infusion.  Patient is admitted for further evaluation.  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)   Hypothyroidism, postsurgical   GERD (gastroesophageal reflux disease)   Morbid obesity (HCC)   Depression   Insulin  pump in place   OSA (obstructive sleep apnea)   Paroxysmal atrial fibrillation (HCC)   Dependent for wheelchair mobility   Complete heart block (HCC)   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 06/25/20   Chronic lower back pain   Mild cognitive impairment   Essential hypertension   History of CVA (cerebrovascular accident)   Obesity hypoventilation syndrome (HCC)   Hypoglycemia  Type 1 diabetes with hypoglycemia: Patient presented following hypoglycemic event at home.   Patient was found unresponsive and required glucagon followed by D10 to improve responsiveness and blood sugar. Blood sugar again drifted down in the ED and was continued on D5 infusion. Mentation has improved to baseline, D5W discontinued.  Monitor CBGs.  Resumed on sliding scale. Diabetic coordinator consulted, started on Semglee 12 units daily.  Acute on chronic hypoxic respiratory failure: Could be multifactorial due to aspiration pneumonia, CHF. Patient uses around 1 L of supplemental oxygen at baseline at night. Chronic respiratory failure believed to be due to combination of OHS, pulmonary hypertension due to undertreated OSA as Patient is not compliant with BiPAP. Initially required 6 L of supplemental oxygen and has been weaned down to 2 L. Chest x-ray read as probable pulmonary edema though concern for possible aspiration component given she was found with some food in her mouth per reports. Initiated on  antibiotics ceftriaxone and Zithromax. Patient has received a dose of Lasix in the ED. Refuses to use BiPAP,  Continue supplemental oxygen only Last echo was in March with EF 60-65%, indeterminate diastolic function, normal RV  function. Hold off on further antibiotics as there is no leukocytosis and if this is pneumonitis alone may not require an antibiotics, Continue Lasix 40 mg daily.   Hypertension Holding amlodipine, Lasix, losartan, metoprolol given low normal blood pressure.    GERD Continue pantoprazole 40 mg daily.   Diabetes mellitus I: Hypoglycemia as above.  No longer on insulin pump. At home She uses 20 units long-acting daily and SSI Started Semglee 12 units daily, continue sliding scale.   Atrial fibrillation with SVR: Holding metoprolol due to low normal blood pressure. Continued on Eliquis.   Complete heart block: Status post pacemaker.  Several paced beats on EKG in ED. Cardiology consulted recommended check for pacemaker interrogation.   Prolonged Qtc: Read as prolonged on EKG in the ED, but unclear if A-fib/paced rhythm may be interfering.   Avoid QTc prolonging medications in the meantime and let us repeat EKG shows improvement   Hypothyroidism: Continue Synthroid.   History of CVA: Residual left hemiparesis and wheelchair dependence. Continue Eliquis   Mild cognitive impairment Depression: History of suicidal ideation per chart. Denies any recurrence,  suicidal ideation.   Obesity Diet discussed in detail.  Hemoptysis: Patient coughed up small amount of blood twice,  denies any chest pain. Continue Eliquis.  It could be secondary to pneumonitis. Pulmonology consulted, CXR > Cardiomegaly, Mild pulm congestion.   DVT prophylaxis: Eliquis on Hold, SCDs Code Status: Full code Family Communication:No family at bed side. Disposition Plan:   Status is: Observation The patient remains OBS appropriate and will d/c before 2 midnights.     Admitted for recurrent hypoglycemia despite being on D10 W,  also found to have acute on chronic hypoxic respiratory failure likely aspiration pneumonia. Now developed hemoptysis.  Pulmonology is consulted.  Cardiology is consulted for bradycardia.  Follow-up interrogation.   Consultants:  None  Procedures:   Antimicrobials: Anti-infectives (From admission, onward)    Start     Dose/Rate Route Frequency Ordered Stop   11/17/23 1130  cefTRIAXone (ROCEPHIN) 1 g in sodium chloride 0.9 %  100 mL IVPB        1 g 200 mL/hr over 30 Minutes Intravenous  Once 11/17/23 1128 11/17/23 1216   11/17/23 1130  azithromycin (ZITHROMAX) 500 mg in sodium chloride 0.9 % 250 mL IVPB        500 mg 250 mL/hr over 60 Minutes Intravenous  Once 11/17/23 1128 11/17/23 1321       Subjective: Patient was seen and examined at bedside.  Overnight events noted.   Patient reports not feeling better. She has coughed up blood twice yesterday. She denies any chest pain, shortness of breath. She remains on baseline oxygen requirement.  Objective: Vitals:   11/19/23 0005 11/19/23 0409 11/19/23 1125 11/19/23 1210  BP: (!) 140/47 (!) 151/54 (!) 137/47 128/71  Pulse: (!) 51 65 (!) 56 (!) 57  Resp: 18 20 (!) 22 20  Temp: 99.4 F (37.4 C) 99.3 F (37.4 C) 97.9 F (36.6 C) 98.4 F (36.9 C)  TempSrc: Oral Oral Oral Oral  SpO2: 99% 98% 96% 96%  Weight:  115.6 kg    Height:        Intake/Output Summary (Last 24 hours) at 11/19/2023 1322 Last data filed at 11/19/2023 1257 Gross per 24 hour  Intake 240 ml  Output 400 ml  Net -160 ml   Filed Weights   11/17/23 1049 11/18/23 0414 11/19/23 0409  Weight: 104.3 kg 115.4 kg 115.6 kg    Examination:  General exam: Appears calm and comfortable , deconditioned, not in any acute distress. Respiratory system: CTA bilaterally. Respiratory effort normal.  RR 15 Cardiovascular system: S1 & S2 heard, Irregular rhythm, no murmur, PPM noted,. No pedal edema. Gastrointestinal system: Abdomen is nondistended, soft and nontender.  Normal bowel sounds heard. Central nervous system: Alert and oriented x 3.  Left-sided weakness noted Extremities: No edema, no cyanosis, no clubbing Skin: No rashes, lesions or ulcers Psychiatry: Judgement and insight appear normal. Mood & affect appropriate.     Data Reviewed: I have personally reviewed following labs and imaging studies  CBC: Recent Labs  Lab 11/17/23 1103 11/17/23 1126 11/18/23 0328 11/19/23 0229  WBC  8.9  --  8.8 6.6  NEUTROABS 7.8*  --   --   --   HGB 10.3* 11.6* 9.9* 10.2*  HCT 34.1* 34.0* 31.5* 32.1*  MCV 96.1  --  94.3 92.8  PLT 143*  --  126* 133*   Basic Metabolic Panel: Recent Labs  Lab 11/17/23 1103 11/17/23 1126 11/18/23 0328 11/19/23 0229  NA 140 142 138 137  K 2.9* 3.9 3.9 3.6  CL 104 105 104 104  CO2 24  --  23 26  GLUCOSE 81 78 245* 154*  BUN 15 22 16 17   CREATININE 0.95 1.00 1.29* 1.01*  CALCIUM 8.8*  --  8.3* 8.5*  MG 1.9  --   --  1.7  PHOS  --   --   --  2.4*   GFR: Estimated Creatinine Clearance: 66.1 mL/min (A) (by C-G formula based on SCr of 1.01 mg/dL (H)). Liver Function Tests: Recent Labs  Lab 11/17/23 1103 11/18/23 0328  AST 26 20  ALT 29 23  ALKPHOS 71 60  BILITOT 0.5 1.1  PROT 6.6 6.0*  ALBUMIN 3.0* 2.7*   No results for input(s): "LIPASE", "AMYLASE" in the last 168 hours. No results for input(s): "AMMONIA" in the last 168 hours. Coagulation Profile: Recent Labs  Lab 11/17/23 1103  INR 1.4*   Cardiac Enzymes: No results for input(s): "CKTOTAL", "CKMB", "CKMBINDEX", "TROPONINI" in the last 168 hours. BNP (last 3 results) No results for input(s): "PROBNP" in the last 8760 hours. HbA1C: Recent Labs    11/18/23 0328  HGBA1C 7.0*   CBG: Recent Labs  Lab 11/18/23 2016 11/19/23 0005 11/19/23 0407 11/19/23 0905 11/19/23 1122  GLUCAP 207* 190* 160* 242* 275*   Lipid Profile: No results for input(s): "CHOL", "HDL", "LDLCALC", "TRIG", "CHOLHDL", "LDLDIRECT" in the last 72 hours. Thyroid Function Tests: No results for input(s): "TSH", "T4TOTAL", "FREET4", "T3FREE", "THYROIDAB" in the last 72 hours. Anemia Panel: No results for input(s): "VITAMINB12", "FOLATE", "FERRITIN", "TIBC", "IRON", "RETICCTPCT" in the last 72 hours. Sepsis Labs: Recent Labs  Lab 11/17/23 1127 11/17/23 1322 11/19/23 0229  PROCALCITON  --   --  0.43  LATICACIDVEN 1.5 1.9  --     Recent Results (from the past 240 hour(s))  Blood Culture (routine  x 2)     Status: None (Preliminary result)   Collection Time: 11/17/23 11:03 AM   Specimen: BLOOD LEFT HAND  Result Value Ref Range Status   Specimen Description BLOOD LEFT HAND  Final   Special Requests   Final    BOTTLES DRAWN AEROBIC AND ANAEROBIC Blood Culture adequate volume   Culture   Final    NO GROWTH 2 DAYS Performed at [facility], [address] [place], [place] [address]    Report Status PENDING  Incomplete  Blood Culture (routine  x 2)     Status: None (Preliminary result)   Collection Time: 11/17/23 11:03 AM   Specimen: BLOOD RIGHT FOREARM  Result Value Ref Range Status   Specimen Description BLOOD RIGHT FOREARM  Final   Special Requests   Final    BOTTLES DRAWN AEROBIC AND ANAEROBIC Blood Culture adequate volume   Culture   Final    NO GROWTH 2 DAYS Performed at [facility], [address] [place], [place] [address]    Report Status PENDING  Incomplete    Radiology Studies: DG Swallowing Func-Speech Pathology  Result Date: 11/19/2023 Table formatting from the original result was not included. Modified Barium Swallow Study Patient Details Name: Jozelyn Capehart MRN: [id number] Date of Birth: 09/03/1951 Today's Date: 11/19/2023 HPI/PMH: HPI: Assyria Brigance is a 72 yo female presenting to ED 11/23 with hypoglycemia, AMS, and worsening hypoxia. CTH negative for acute abnormalities. Most recently seen by SLP 01/2022 and recommended Dys 2 diet with thin liquids with known risk of aspiration after declining to use thickened liquids. MBS 11/2021 with aspiration of thin liquids due to mistiming and was recommended Dys 3 solids with nectar thick liquids. recommended PMH includes HTN, HLD, GERD, T1DM, A-fib, complete heart block s/p pacemaker, hypothyroidism, CVA with residual L hemiparesis and wheelchair dependence, MCI, depression, OSA, diastolic CHF, obesity, lower back pain, chronic hypoxia requiring 1L O2 at baseline Clinical Impression: Clinical  Impression: Pt has had a decline in swallowing function since last MBS, with increased volume and frequency of aspiration and subjective c/o trouble with solids. Her primary deficits continue to be with mistiming. She has incomplete laryngeal vestibule closure, laryngeal elevation, and hyoid excursion. Thin and nectar thick liquids spill into the airway prior to initiation of the swallow and before any attempts at closing the airway. Aspiration is mostly sensed via cough or pt report of "burning" but her cough does not clear the aspirates (PAS 7). Strategies that were found to be helpful in previous MBS are no longer effective. Pt had no penetration or aspiration with honey thick liquids or solids though, as they are better contained within the valleculae. Other than mastication that is slow, with pt taking rest breaks, and slow posterior transit with solids, her oral phase is grossly functional with solids. Pt reports feeling like she can't swallow solids though (reporting that they get stuck) even on current mechanical soft diet. Unable to perform esophageal sweep due to body habitus, but question if this could be a primary esophageal issue. She prefers to try Dys 2 (finely chopped) diet and is agreeable to trying honey thick liquids. Factors that may increase risk of adverse event in presence of aspiration Rubye Oaks & Clearance Coots 2021): Factors that may increase risk of adverse event in presence of aspiration Rubye Oaks & Clearance Coots 2021): Respiratory or GI disease; Weak cough; Aspiration of thick, dense, and/or acidic materials; Frequent aspiration of large volumes Recommendations/Plan: Swallowing Evaluation Recommendations Swallowing Evaluation Recommendations Recommendations: PO diet PO Diet Recommendation: Dysphagia 2 (Finely chopped); Moderately thick liquids (Level 3, honey thick) Liquid Administration via: Cup; Straw Medication Administration: Whole meds with liquid (honey thick) Supervision: Patient able to  self-feed; Set-up assistance for safety; Intermittent supervision/cueing for swallowing strategies Swallowing strategies  : Slow rate; Small bites/sips; Follow solids with liquids Postural changes: Position pt fully upright for meals; Stay upright 30-60 min after meals Oral care recommendations: Oral care BID (2x/day) Caregiver Recommendations: Avoid jello, ice cream, thin soups, popsicles; Remove water pitcher Treatment Plan Treatment  Plan Treatment recommendations: Therapy as outlined in treatment plan below Follow-up recommendations: Home health SLP Functional status assessment: Patient has had a recent decline in their functional status and demonstrates the ability to make significant improvements in function in a reasonable and predictable amount of time. Treatment frequency: Min 2x/week Treatment duration: 2 weeks Interventions: Aspiration precaution training; Compensatory techniques; Patient/family education; Trials of upgraded texture/liquids; Diet toleration management by SLP; Respiratory muscle strength training Recommendations Recommendations for follow up therapy are one component of a multi-disciplinary discharge planning process, led by the attending physician.  Recommendations may be updated based on patient status, additional functional criteria and insurance authorization. Assessment: Orofacial Exam: Orofacial Exam Oral Cavity - Dentition: Dentures, top; Dentures, bottom Anatomy: Anatomy: WFL Boluses Administered: Boluses Administered Boluses Administered: Thin liquids (Level 0); Mildly thick liquids (Level 2, nectar thick); Moderately thick liquids (Level 3, honey thick); Puree; Solid  Oral Impairment Domain: Oral Impairment Domain Lip Closure: -- (not able to observe on fluoro due to positioning but no overt loss noted outwardly) Tongue control during bolus hold: Cohesive bolus between tongue to palatal seal Bolus preparation/mastication: Slow prolonged chewing/mashing with complete recollection  Bolus transport/lingual motion: Slow tongue motion Oral residue: Residue collection on oral structures Location of oral residue : Tongue Initiation of pharyngeal swallow : Posterior laryngeal surface of the epiglottis (trachea)  Pharyngeal Impairment Domain: Pharyngeal Impairment Domain Soft palate elevation: No bolus between soft palate (SP)/pharyngeal wall (PW) Laryngeal elevation: Partial superior movement of thyroid cartilage/partial approximation of arytenoids to epiglottic petiole Anterior hyoid excursion: Partial anterior movement Epiglottic movement: Complete inversion Laryngeal vestibule closure: Incomplete, narrow column air/contrast in laryngeal vestibule Pharyngeal stripping wave : Present - complete Pharyngeal contraction (A/P view only): N/A Pharyngoesophageal segment opening: Complete distension and complete duration, no obstruction of flow Tongue base retraction: No contrast between tongue base and posterior pharyngeal wall (PPW) Pharyngeal residue: Complete pharyngeal clearance Location of pharyngeal residue: N/A  Esophageal Impairment Domain: Esophageal Impairment Domain Esophageal clearance upright position: -- (unable to scan lower due to body habitus) Pill: Pill Consistency administered: Moderately thick liquids (Level 3, honey thick) Moderately thick liquids (Level 3, honey thick): WFL Penetration/Aspiration Scale Score: Penetration/Aspiration Scale Score 1.  Material does not enter airway: Moderately thick liquids (Level 3, honey thick); Puree; Solid; Pill 7.  Material enters airway, passes BELOW cords and not ejected out despite cough attempt by patient: Thin liquids (Level 0); Mildly thick liquids (Level 2, nectar thick) Compensatory Strategies: Compensatory Strategies Compensatory strategies: Yes Straw: Ineffective; Effective Effective Straw: Moderately thick liquid (Level 3, honey thick) Ineffective Straw: Mildly thick liquid (Level 2, nectar thick) Oral bolus hold: Ineffective Ineffective  Oral Bolus Hold : Thin liquid (Level 0); Mildly thick liquid (Level 2, nectar thick)   General Information: Caregiver present: No  Diet Prior to this Study: Dysphagia 3 (mechanical soft); Mildly thick liquids (Level 2, nectar thick)   Temperature : Normal   Respiratory Status: WFL   Supplemental O2: Nasal cannula   History of Recent Intubation: No  Behavior/Cognition: Alert; Cooperative; Pleasant mood Self-Feeding Abilities: Able to self-feed Baseline vocal quality/speech: Normal Volitional Cough: Able to elicit Volitional Swallow: Able to elicit Exam Limitations: No limitations Goal Planning: Prognosis for improved oropharyngeal function: Good Barriers to Reach Goals: Time post onset No data recorded Patient/Family Stated Goal: wants to feel better Consulted and agree with results and recommendations: Patient Pain: Pain Assessment Pain Assessment: Faces Faces Pain Scale: 0 End of Session: Start Time:SLP Start Time (ACUTE ONLY): 0820 Stop Time: SLP  Stop Time (ACUTE ONLY): 0844 Time Calculation:SLP Time Calculation (min) (ACUTE ONLY): 24 min Charges: SLP Evaluations $ SLP Speech Visit: 1 Visit SLP Evaluations $BSS Swallow: 1 Procedure $MBS Swallow: 1 Procedure SLP visit diagnosis: SLP Visit Diagnosis: Dysphagia, oropharyngeal phase (R13.12) Past Medical History: Past Medical History: Diagnosis Date  Abnormal echocardiogram   Anemia   Arthritis   BPPV (benign paroxysmal positional vertigo), right 06/13/2017  Bradycardia 03/07/2020  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  CHB (complete heart block) (HCC) 03/08/2020  CHF (congestive heart failure) (HCC)   Closed fracture of left proximal humerus 07/08/2019  Decreased range of motion of left shoulder 09/03/2020  Depression   Dizzy 03/07/2020  bradycardia  Edentulous 09/03/2020  GERD (gastroesophageal reflux disease)   Hemiparesis affecting left side as late effect of cerebrovascular accident (CVA) (HCC) 03/08/2020  Hemorrhoids    History of Falls with injury 03/08/2020  Fall resulting in left humeral fracture  Hyperlipidemia   Hypertension   Hypothyroidism   Intracranial vascular stenosis   Lacunar infarction (HCC)   Brain MRI 07/2021 multiple small remote pontine lacunar infarcts  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  Severe chronic microvascular ischemic disease of the pons, left middle cerebellar peduncle, and mini remote lacunar infarcts, progressive since 2019.  Remote pontine microhemorrhage most likely of hypertensive etiology.  10/10/21 CT reveals chronic atrophic changes.    Malignant tumor of thyroid gland (HCC) 03/27/2022  Near syncope 03/08/2020  Paroxysmal atrial fibrillation (HCC)   Presence of permanent cardiac pacemaker   Proteinuria 03/08/2020  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 06/25/20 06/26/2020  Seasonal allergies   Stroke (HCC)   Suicidal ideation 11/16/2021  Thyroid cancer (HCC)   per pt S/p Total Thyroidectomy with Radioactive Iodine Therapy  Type 1 diabetes mellitus (HCC)  Past Surgical History: Past Surgical History: Procedure Laterality Date  ABDOMINAL ANGIOGRAM N/A 05/14/2012  Procedure: ABDOMINAL ANGIOGRAM;  Surgeon: [name], MD;  Location: [facility] CATH LAB;  Service: Cardiovascular;  Laterality: N/A;  ABDOMINAL HYSTERECTOMY  1995  partial  ANKLE FRACTURE SURGERY Right   CARDIAC CATHETERIZATION    with coronary angiogram  cataract  Bilateral 2018  COLONOSCOPY N/A 09/09/2014  Procedure: COLONOSCOPY;  Surgeon: Iva Boop, MD;  Location: [facility] ENDOSCOPY;  Service: Endoscopy;  Laterality: N/A;  ESOPHAGOGASTRODUODENOSCOPY (EGD) WITH PROPOFOL N/A 12/03/2018  Procedure: ESOPHAGOGASTRODUODENOSCOPY (EGD) WITH PROPOFOL;  Surgeon: Sherrilyn Rist, MD;  Location: [facility] ENDOSCOPY;  Service: Gastroenterology;  Laterality: N/A;  LEFT AND RIGHT HEART CATHETERIZATION WITH CORONARY  ANGIOGRAM N/A 05/14/2012  Procedure: LEFT AND RIGHT HEART CATHETERIZATION WITH CORONARY ANGIOGRAM;  Surgeon: [name], MD;  Location: [facility] CATH LAB;  Service: Cardiovascular;  Laterality: N/A;  LOOP RECORDER INSERTION N/A 12/23/2018  Procedure: LOOP RECORDER INSERTION;  Surgeon: Regan Lemming, MD;  Location: [facility] INVASIVE CV LAB;  Service: Cardiovascular;  Laterality: N/A;  LOOP RECORDER REMOVAL N/A 06/25/2020  Procedure: LOOP RECORDER REMOVAL;  Surgeon: Regan Lemming, MD;  Location: [facility] INVASIVE CV LAB;  Service: Cardiovascular;  Laterality: N/A;  MINI METER   09/08/2014  ELECTRONIC INSULIN PUMP  PACEMAKER IMPLANT N/A 06/25/2020  Procedure: PACEMAKER IMPLANT;  Surgeon: Regan Lemming, MD;  Location: [facility] INVASIVE CV LAB;  Service: Cardiovascular;  Laterality: N/A;  TEE WITHOUT CARDIOVERSION N/A 10/16/2018  Procedure: TRANSESOPHAGEAL ECHOCARDIOGRAM (TEE);  Surgeon: Jodelle Red, MD;  Location: [facility] ENDOSCOPY;  Service: Cardiovascular;  Laterality: N/A;  THYROIDECTOMY  2006  for thyroid cancer Mahala Menghini., M.A. CCC-SLP Acute Rehabilitation Services Office [phone] Secure chat preferred 11/19/2023, 10:38 AM  DG CHEST PORT 1 VIEW  Result Date: 11/18/2023 CLINICAL DATA:  Cough hemoptysis EXAM: PORTABLE CHEST 1 VIEW COMPARISON:  11/17/2023, 09/10/2023 FINDINGS: Similar appearance of left-sided pacing device. Electronic recording device over left lower chest. Cardiomegaly with vascular congestion and pulmonary edema, slightly increased compared to prior. Suspicion of trace right pleural effusion. Aortic atherosclerosis. No pneumothorax IMPRESSION: Cardiomegaly with vascular congestion and pulmonary edema, slightly increased compared to prior. Suspicion of trace right pleural effusion. Electronically Signed   By: Jasmine Pang M.D.   On: 11/18/2023 16:24    Scheduled Meds:  apixaban  5 mg Oral BID   furosemide  40 mg Oral BID   insulin aspart  0-6 Units Subcutaneous Q4H   insulin  glargine-yfgn  12 Units Subcutaneous Daily   levothyroxine  175 mcg Oral Q0600   metoprolol succinate  50 mg Oral Daily   pantoprazole  40 mg Oral BID   sodium chloride flush  3 mL Intravenous Q12H   Continuous Infusions:   LOS: 0 days    Time spent: 35 Mins    Willeen Niece, MD [facility]   If 7PM-7AM, please contact night-coverage

## 2023-11-19 NOTE — Evaluation (Signed)
Modified Barium Swallow Study  Patient Details  Name: [name] MRN: [id number] Date of Birth: 28-Nov-1951  Today's Date: 11/19/2023  Modified Barium Swallow completed.  Full report located under Chart Review in the Imaging Section.  History of Present Illness [name] is a 72 yo female presenting to ED 11/23 with hypoglycemia, AMS, and worsening hypoxia. CTH negative for acute abnormalities. Most recently seen by SLP 01/2022 and recommended Dys 2 diet with thin liquids with known risk of aspiration after declining to use thickened liquids. MBS 11/2021 with aspiration of thin liquids due to mistiming and was recommended Dys 3 solids with nectar thick liquids. 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 Her primary deficits continue to be with mistiming. She has incomplete laryngeal vestibule closure, laryngeal elevation, and hyoid excursion. Thin and nectar thick liquids spill into the airway prior to initiation of the swallow and before any attempts at closing the airway. Aspiration is mostly sensed via cough or pt report of "burning" but her cough does not clear the aspirates (PAS 7). Strategies that were found to be helpful in previous MBS are no longer effective. Pt had no penetration or aspiration with honey thick liquids or solids though, as they are better contained within the valleculae. Other than mastication that is slow, with pt taking rest breaks, and slow posterior transit with solids, her oral phase is grossly functional with solids. Pt reports feeling like she can't  swallow solids though (reporting that they get stuck) even on current mechanical soft diet. Unable to perform esophageal sweep due to body habitus, but question if this could be a primary esophageal issue. She prefers to try Dys 2 (finely chopped) diet and is agreeable to trying honey thick liquids.  Factors that may increase risk of adverse event in presence of aspiration [name] & [name] 2021): Respiratory or GI disease;Weak cough;Aspiration of thick, dense, and/or acidic materials;Frequent aspiration of large volumes  Swallow Evaluation Recommendations Recommendations: PO diet PO Diet Recommendation: Dysphagia 2 (Finely chopped);Moderately thick liquids (Level 3, honey thick) Liquid Administration via: Cup;Straw Medication Administration: Whole meds with liquid (honey thick) Supervision: Patient able to self-feed;Set-up assistance for safety;Intermittent supervision/cueing for swallowing strategies Swallowing strategies  : Slow rate;Small bites/sips;Follow solids with liquids Postural changes: Position pt fully upright for meals;Stay upright 30-60 min after meals Oral care recommendations: Oral care BID (2x/day) Caregiver Recommendations: Avoid jello, ice cream, thin soups, popsicles;Remove water pitcher      [name]., M.A. CCC-SLP Acute Rehabilitation Services Office [phone]  Secure chat preferred  11/19/2023,10:35 AM

## 2023-11-19 NOTE — Plan of Care (Signed)
  Problem: Clinical Measurements: Goal: Ability to maintain clinical measurements within normal limits will improve Outcome: Progressing Goal: Will remain free from infection Outcome: Progressing Goal: Diagnostic test results will improve Outcome: Progressing Goal: Respiratory complications will improve Outcome: Progressing Goal: Cardiovascular complication will be avoided Outcome: Progressing   Problem: Nutrition: Goal: Adequate nutrition will be maintained Outcome: Progressing   Problem: Elimination: Goal: Will not experience complications related to bowel motility Outcome: Progressing Goal: Will not experience complications related to urinary retention Outcome: Progressing   Problem: Pain Management: Goal: General experience of comfort will improve Outcome: Progressing   Problem: Safety: Goal: Ability to remain free from injury will improve Outcome: Progressing   Problem: Skin Integrity: Goal: Risk for impaired skin integrity will decrease Outcome: Progressing   Problem: Education: Goal: Knowledge of General Education information will improve Description: Including pain rating scale, medication(s)/side effects and non-pharmacologic comfort measures Outcome: Not Progressing   Problem: Health Behavior/Discharge Planning: Goal: Ability to manage health-related needs will improve Outcome: Not Progressing   Problem: Activity: Goal: Risk for activity intolerance will decrease Outcome: Not Progressing   Problem: Coping: Goal: Level of anxiety will decrease Outcome: Not Progressing

## 2023-11-19 NOTE — Plan of Care (Signed)
Problem: Health Behavior/Discharge Planning: Goal: Ability to manage health-related needs will improve Outcome: Progressing   Problem: Nutrition: Goal: Adequate nutrition will be maintained Outcome: Progressing   Problem: Coping: Goal: Level of anxiety will decrease Outcome: Progressing

## 2023-11-19 NOTE — Inpatient Diabetes Management (Signed)
Inpatient Diabetes Program Recommendations  AACE/ADA: New Consensus Statement on Inpatient Glycemic Control (2015)  Target Ranges:  Prepandial:   less than 140 mg/dL      Peak postprandial:   less than 180 mg/dL (1-2 hours)      Critically ill patients:  140 - 180 mg/dL   Lab Results  Component Value Date   GLUCAP 275 (H) 11/19/2023   HGBA1C 7.0 (H) 11/18/2023    Review of Glycemic Control  Latest Reference Range & Units 11/18/23 08:04 11/18/23 11:48 11/18/23 16:58 11/18/23 20:16 11/19/23 00:05 11/19/23 04:07 11/19/23 09:05 11/19/23 11:22  Glucose-Capillary 70 - 99 mg/dL 147 (H) 829 (H) 562 (H) 207 (H) 190 (H) 160 (H) 242 (H) 275 (H)  (H): Data is abnormally high  Diabetes history: T1D Outpatient Diabetes medications: Tresiba 22 units every day, Humalog 3-5 units TID Current orders for Inpatient glycemic control: Novolog 0-6 units Q4H  Inpatient Diabetes Program Recommendations:    Please consider:  Semglee 12 units QD

## 2023-11-19 NOTE — Consult Note (Addendum)
NAME:  [name], MRN:  [id number], DOB:  [birth year], LOS: 0 ADMISSION DATE:  11/17/2023, CONSULTATION DATE:  11/19/2023 REFERRING MD: Idelle Leech , CHIEF COMPLAINT:   Hemoptysis   History of Present Illness:  72 yrs old female with PMH significant of hypertension, hyperlipidemia, GERD, type 1 diabetes, atrial fibrillation, complete heart block status post pacemaker, hypothyroidism, CVA with residual left hemiparesis and wheelchair dependence, MCI, depression with history of suicidal ideations, obesity, OSA, diastolic CHF, low back pain, chronic hypoxia presented in the ED with hypoglycemia, altered mental status, worsening hypoxia. Pt. Had taken an extra dose of insulin at 4 AM. They think her glucometer may have had a high reading.  Later family found patient was unresponsive.  When EMS arrived they noticed patient with candy bar in her mouth possibly trying to self-correct.  CBG was 42 when EMS checked,  She was given glucagon with improvement in her glucose but continued to remain unresponsive.  She has received D10 infusion en route to the ED.  She became more responsive on arrival in the ED.  She was noted to be hypoxic on room air requiring up to 6 L of supplemental oxygen on arrival and then weaned down to 2 L.  Significant labs in the ED potassium 2.9,  INR 1.2, BNP 212, troponin 68.  Lactic acid - x 2. Chest x-ray shows mild pulmonary edema/CHF.  CT head negative.  Patient empirically started on ceftriaxone, Zithromax and was given a dose of IV Lasix and started on D5 infusion.  Patient was admitted by [facility].   She has been improving, and 11/24 she had 2 episodes of hemoptysis. Per nursing this is just streaks of blood in her sputum. This has also occurred once today,11/25. Of note, patient does have issues with her swallow.  PCCM have been asked to evaluate hemoptysis prior to discharge.   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   CHB (complete heart block) (HCC) 03/08/2020   CHF (congestive heart failure) (HCC)    Closed fracture of left proximal humerus 07/08/2019   Decreased range of motion of left shoulder 09/03/2020   Depression    Dizzy 03/07/2020   bradycardia   Edentulous 09/03/2020   GERD (gastroesophageal reflux disease)    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  Severe chronic microvascular ischemic disease of the pons, left middle cerebellar peduncle, and mini remote lacunar infarcts, progressive since 2019.  Remote pontine microhemorrhage most likely of hypertensive etiology.  10/10/21 CT reveals chronic atrophic changes.     Malignant tumor of thyroid gland (HCC) 03/27/2022   Near syncope 03/08/2020   Paroxysmal atrial fibrillation (HCC)    Presence of permanent cardiac pacemaker    Proteinuria 03/08/2020  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 06/25/20 06/26/2020   Seasonal allergies    Stroke [facility])    Suicidal ideation 11/16/2021   Thyroid cancer (HCC)    per pt S/p Total Thyroidectomy with Radioactive Iodine Therapy   Type 1 diabetes mellitus (HCC)      Significant Hospital Events: Including procedures, antibiotic start and stop dates in addition to other pertinent  events   11/17/2023 Admission to [facility] 11/19/2023 PCCM consult  Interim History / Subjective:  States she is doing well. On her home oxygen at 1 L, Sats are 96%. She states she has some streaks of blood in her sputum , started 11/24. She is currently in a fib since 5 am this morning, and she is bradycardic HR in the 50's. She does have a Visual merchandiser, which is set to a rate of 50.   Objective   Blood pressure 128/71, pulse (!) 57, temperature 98.4 F (36.9 C), temperature source Oral, resp. rate 20, height 5\' 7"  (1.702 m), weight 115.6 kg, SpO2 96%.        Intake/Output Summary (Last 24 hours) at 11/19/2023 1302 Last data filed at 11/19/2023 1257 Gross per 24 hour  Intake 240 ml  Output 400 ml  Net -160 ml   Filed Weights   11/17/23 1049 11/18/23 0414 11/19/23 0409  Weight: 104.3 kg 115.4 kg 115.6 kg    Examination: General:  Awake and alert, elderly female, OOB in chair HENT:  NCAT, No LAD, No JVD Lungs:  Bilateral chest excursion, clear throughout , diminished bilaterally Cardiovascular:  S1, S2, Irr rate and rhythm, a fib per tele with bradycardia in the 50's, pacemaker Abdomen:  Soft, NT, ND, BS +, Body mass index is 39.92 kg/m.  Extremities: No obvious deformities, Trace LE edema  Neuro:  Awake and alert and oriented x 3, MAE x 4, very appropriate GU:  Not assessed, but Pure Wik noted  Resolved Hospital Problem list     Assessment & Plan:  Hemoptysis in patient on Eliquis Streaks of blood per sputum x 2 on 11/24, and x 1 on  11/25 Per nursing, streaks BRB yesterday, now darker HGB stable , platelets 133( slightly less than baseline) VSS Pulmonary edema per CXR Plan Continue Eliquis as she is at higher risk of embolic event than bleeding Monitor for increase in hemoptysis Monitor CBC which is stable Ensure patient is able to protect her airway TXA Nebs as needed only for increase in hemoptysis  Follow up in OP office the week on 12/1 as hospital follow up for  hemoptysis Seek emergency care if bleeding worsens and does not stop or is affecting ability to breath. Went over this with patient at bedside in detail.   Thrombocytopenia, unsure etiology Could be contributing factor Plan Trend CBC and platelets  Monitor for bleeding  Atrial Fibrillation  Rate controlled  Plan Supplemental Mag today and Oral phos today Maintain K > 4 and Mag > 2      Best Practice (right click and "Reselect all SmartList Selections" daily)   Per Primary Team  Labs   CBC: Recent Labs  Lab 11/17/23 1103 11/17/23 1126 11/18/23 0328 11/19/23 0229  WBC 8.9  --  8.8 6.6  NEUTROABS 7.8*  --   --   --   HGB 10.3* 11.6* 9.9* 10.2*  HCT 34.1* 34.0* 31.5* 32.1*  MCV 96.1  --  94.3 92.8  PLT 143*  --  126* 133*    Basic Metabolic  Panel: Recent Labs  Lab 11/17/23 1103 11/17/23 1126 11/18/23 0328 11/19/23 0229  NA 140 142 138 137  K 2.9* 3.9 3.9 3.6  CL 104 105 104 104  CO2 24  --  23 26  GLUCOSE 81 78 245* 154*  BUN 15 22 16 17   CREATININE 0.95 1.00 1.29* 1.01*  CALCIUM 8.8*  --  8.3* 8.5*  MG 1.9  --   --  1.7  PHOS  --   --   --  2.4*   GFR: Estimated Creatinine Clearance: 66.1 mL/min (A) (by C-G formula based on SCr of 1.01 mg/dL (H)). Recent Labs  Lab 11/17/23 1103 11/17/23 1127 11/17/23 1322 11/18/23 0328 11/19/23 0229  PROCALCITON  --   --   --   --  0.43  WBC 8.9  --   --  8.8 6.6  LATICACIDVEN  --  1.5 1.9  --   --     Liver Function Tests: Recent Labs  Lab 11/17/23 1103 11/18/23 0328  AST 26 20  ALT 29 23  ALKPHOS 71 60  BILITOT 0.5 1.1  PROT 6.6 6.0*  ALBUMIN 3.0* 2.7*   No results for input(s): "LIPASE", "AMYLASE" in the last 168 hours. No results for input(s): "AMMONIA" in the last 168 hours.  ABG    Component Value Date/Time   PHART 7.51 (H) 02/14/2022 1443   PCO2ART 41 02/14/2022 1443   PO2ART 76 (L) 02/14/2022 1443   HCO3 32.8 (H) 02/14/2022 1443   TCO2 28 11/17/2023 1126   ACIDBASEDEF 10.0 (H)  04/15/2019 1333   O2SAT 97.3 02/14/2022 1443     Coagulation Profile: Recent Labs  Lab 11/17/23 1103  INR 1.4*    Cardiac Enzymes: No results for input(s): "CKTOTAL", "CKMB", "CKMBINDEX", "TROPONINI" in the last 168 hours.  HbA1C: HbA1c, POC (controlled diabetic range)  Date/Time Value Ref Range Status  11/16/2021 02:55 PM 8.5 (A) 0.0 - 7.0 % Final  06/06/2021 10:40 AM 10.2 (A) 0.0 - 7.0 % Final   Hgb A1c MFr Bld  Date/Time Value Ref Range Status  11/18/2023 03:28 AM 7.0 (H) 4.8 - 5.6 % Final    Comment:    (NOTE) Pre diabetes:          5.7%-6.4%  Diabetes:              >6.4%  Glycemic control for   <7.0% adults with diabetes   02/11/2022 12:32 AM 8.3 (H) 4.8 - 5.6 % Final    Comment:    (NOTE) Pre diabetes:          5.7%-6.4%  Diabetes:              >6.4%  Glycemic control for   <7.0% adults with diabetes     CBG: Recent Labs  Lab 11/18/23 2016 11/19/23 0005 11/19/23 0407 11/19/23 0905 11/19/23 1122  GLUCAP 207* 190* 160* 242* 275*    Review of Systems:    Complains of not feeling as good as her baseline , and having some scant blood in sputum  Past Medical History:  She,  has a past medical history of Abnormal echocardiogram, Anemia, Arthritis, BPPV (benign paroxysmal positional vertigo), right (06/13/2017), Bradycardia (03/07/2020), Cerebrovascular accident (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 (HCC) (10/29/2015), CHB (complete heart block) (HCC) (03/08/2020), CHF (congestive heart failure) (HCC), Closed fracture of left proximal humerus (07/08/2019), Decreased range of motion of left shoulder (09/03/2020), Depression, Dizzy (03/07/2020), Edentulous (09/03/2020), GERD (gastroesophageal reflux disease), Hemiparesis affecting left side as late  effect of cerebrovascular accident (CVA) (HCC) (03/08/2020), Hemorrhoids, History of Falls with injury (03/08/2020), Hyperlipidemia, Hypertension, Hypothyroidism, Intracranial vascular stenosis, Lacunar infarction  [facility]), 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HCC) (08/10/2021), Left pontine stroke (HCC) (08/06/2021), Malignant tumor of thyroid gland (HCC) (03/27/2022), Near syncope (03/08/2020), Paroxysmal atrial fibrillation (HCC), Presence of permanent cardiac pacemaker, Proteinuria (03/08/2020), 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 06/25/20 (06/26/2020), Seasonal allergies, Stroke [facility]), Suicidal ideation (11/16/2021), Thyroid cancer (HCC), and Type 1 diabetes mellitus (HCC).   Surgical History:   Past Surgical History:  Procedure Laterality Date   ABDOMINAL ANGIOGRAM N/A 05/14/2012   Procedure: ABDOMINAL ANGIOGRAM;  Surgeon: [name], MD;  Location: [facility] CATH LAB;  Service: Cardiovascular;  Laterality: N/A;   ABDOMINAL HYSTERECTOMY  1995   partial   ANKLE FRACTURE SURGERY Right    CARDIAC CATHETERIZATION     with coronary angiogram   cataract  Bilateral 2018   COLONOSCOPY N/A 09/09/2014   Procedure: COLONOSCOPY;  Surgeon: Iva Boop, MD;  Location: [facility] ENDOSCOPY;  Service: Endoscopy;  Laterality: N/A;   ESOPHAGOGASTRODUODENOSCOPY (EGD) WITH PROPOFOL N/A 12/03/2018   Procedure: ESOPHAGOGASTRODUODENOSCOPY (EGD) WITH PROPOFOL;  Surgeon: Sherrilyn Rist, MD;  Location: [facility] ENDOSCOPY;  Service: Gastroenterology;  Laterality: N/A;   LEFT AND RIGHT HEART CATHETERIZATION WITH CORONARY ANGIOGRAM N/A 05/14/2012   Procedure: LEFT AND RIGHT HEART CATHETERIZATION WITH CORONARY ANGIOGRAM;  Surgeon: [name], MD;  Location: [facility] CATH LAB;  Service: Cardiovascular;  Laterality: N/A;   LOOP RECORDER INSERTION N/A 12/23/2018   Procedure: LOOP RECORDER INSERTION;  Surgeon: Regan Lemming, MD;  Location: [facility] INVASIVE CV LAB;  Service: Cardiovascular;  Laterality: N/A;   LOOP RECORDER REMOVAL N/A 06/25/2020   Procedure: LOOP RECORDER REMOVAL;  Surgeon: Regan Lemming, MD;  Location: [facility] INVASIVE CV LAB;  Service: Cardiovascular;  Laterality: N/A;   MINI METER    09/08/2014   ELECTRONIC INSULIN PUMP   PACEMAKER IMPLANT N/A 06/25/2020   Procedure: PACEMAKER IMPLANT;  Surgeon: Regan Lemming, MD;  Location: [facility] INVASIVE CV LAB;  Service: Cardiovascular;  Laterality: N/A;   TEE WITHOUT CARDIOVERSION N/A 10/16/2018   Procedure: TRANSESOPHAGEAL ECHOCARDIOGRAM (TEE);  Surgeon: Jodelle Red, MD;  Location: [facility] ENDOSCOPY;  Service: Cardiovascular;  Laterality: N/A;   THYROIDECTOMY  2006   for thyroid cancer     Social History:   reports that she has never smoked. She has never used smokeless tobacco. She reports that she does not drink alcohol and does not use drugs.   Family History:  Her family history includes Diabetes in her maternal aunt; Early death in her father; Heart attack in her maternal grandfather and mother; Heart disease in her maternal aunt, maternal grandfather, and mother; Heart failure in her maternal grandfather; Hyperlipidemia in her father and mother; Hypertension in her father and mother; Prostate cancer in her paternal uncle; Renal Disease in her mother; Stroke in her son. There is no history of Breast cancer.   Allergies Allergies  Allergen Reactions   Latex Rash     Home Medications  Prior to Admission medications   Medication Sig Start Date End Date Taking? Authorizing Provider  acetaminophen (TYLENOL) 650 MG CR tablet Take 650 mg by mouth daily as needed (severe pain).   Yes [provider]  albuterol (VENTOLIN HFA) 108 (90 Base) MCG/ACT inhaler Inhale 2 puffs into the lungs every 6 (six) hours as needed for wheezing or shortness of breath.   Yes [provider]  amLODipine (NORVASC) 2.5  MG tablet Take 2.5 mg by mouth daily.   Yes [provider]  apixaban (ELIQUIS) 5 MG TABS tablet Take 1 tablet (5 mg total) by mouth 2 (two) times daily. 10/15/23  Yes Yates Decamp, MD  atorvastatin (LIPITOR) 80 MG tablet TAKE 1 TABLET(80 MG) BY MOUTH EVERY EVENING 10/23/22  Yes Alicia Amel, MD  b  complex vitamins capsule Take 1 capsule by mouth daily.   Yes [provider]  busPIRone (BUSPAR) 5 MG tablet TAKE 1 TABLET(5 MG) BY MOUTH THREE TIMES DAILY Patient taking differently: Take 5 mg by mouth 3 (three) times daily as needed (anxiety). 02/02/23  Yes Alicia Amel, MD  Cholecalciferol (VITAMIN D-3 PO) Take 1 capsule by mouth daily.   Yes [provider]  furosemide (LASIX) 40 MG tablet Take 1 tablet (40 mg total) by mouth 2 (two) times daily. 02/06/23  Yes Custovic, Rozell Searing, DO  gabapentin (NEURONTIN) 300 MG capsule Take 1 capsule (300 mg total) by mouth as needed. Patient taking differently: Take 300 mg by mouth 3 (three) times daily. 01/09/23  Yes Alicia Amel, MD  HUMALOG 100 UNIT/ML injection Inject 3-5 Units into the skin 3 (three) times daily with meals as needed for high blood sugar. 07/23/23  Yes [provider]  levothyroxine (SYNTHROID) 175 MCG tablet TAKE 1 TABLET(175 MCG) BY MOUTH DAILY BEFORE BREAKFAST 12/13/22  Yes Alicia Amel, MD  loratadine (CLARITIN) 10 MG tablet Take 10 mg by mouth daily.   Yes [provider]  losartan (COZAAR) 25 MG tablet Take 1 tablet (25 mg total) by mouth daily. 10/30/23 01/28/24 Yes Yates Decamp, MD  MAGNESIUM-POTASSIUM PO Take 1 each by mouth every evening. Potassium-magnesium combination gummy.   Yes [provider]  metoprolol succinate (TOPROL-XL) 50 MG 24 hr tablet Take 1 tablet (50 mg total) by mouth daily. 01/16/23  Yes Sheilah Pigeon, PA-C  OXYGEN Inhale 2 L into the lungs continuous.   Yes [provider]  pantoprazole (PROTONIX) 40 MG tablet Take 1 tablet (40 mg total) by mouth 2 (two) times daily. 10/27/21  Yes Medina-Vargas, Monina C, NP  polyethylene glycol powder (GLYCOLAX/MIRALAX) 17 GM/SCOOP powder Take 17 g by mouth 2 (two) times daily as needed for moderate constipation. Patient taking differently: Take 17 g by mouth daily. 06/21/22  Yes [name], Makiera, MD  TRESIBA  FLEXTOUCH 100 UNIT/ML FlexTouch Pen Inject 22 Units into the skin daily. 09/04/23  Yes [provider]  Monte Fantasia INHUB 500-50 MCG/ACT AEPB Inhale 1 puff into the lungs in the morning and at bedtime. 08/31/23  Yes [provider]     Pulmonary APP Time  30 minutes   Bevelyn Ngo, MSN, AGACNP-BC [facility] Pulmonary/Critical Care Medicine See Amion for personal pager PCCM on call pager [phone]  11/19/2023 2:02 PM

## 2023-11-19 NOTE — Progress Notes (Signed)
Mobility Specialist Progress Note:    11/19/23 1048  Mobility  Activity Transferred from bed to chair  Level of Assistance +2 (takes two people) (MaxA)  Assistive Device Stedy  Activity Response Tolerated well  Mobility Referral Yes  $Mobility charge 1 Mobility  Mobility Specialist Start Time (ACUTE ONLY) 1015  Mobility Specialist Stop Time (ACUTE ONLY) 1028  Mobility Specialist Time Calculation (min) (ACUTE ONLY) 13 min   Pt received in bed agreeable to mobility. Pt need ModA +2 w/ bed mobility and MaxA+2 for STS. Transferred to the chair w/o fault. No c/o throughout. Situated in chair w/ call bell and personal belongings in reach. All needs met.  Thompson Grayer Mobility Specialist  Please contact vis Secure Chat or  Rehab Office [phone]

## 2023-11-19 NOTE — TOC Initial Note (Addendum)
Transition of Care [facility]) - Initial/Assessment Note    Patient Details  Name: [name] MRN: [id number] Date of Birth: [birth year]  Transition of Care [facility]) CM/SW Contact:    Leone Haven, RN Phone Number: 11/19/2023, 4:06 PM  Clinical Narrative:                 From home with sister, Britta Mccreedy, has PCP and insurance on file, states has  a [facility] and a private duty aide with [employer] - Fri from 11 am to 2 and Sat to Sun from 12 to 2.  NCM received message from [facility] with [facility] , patient is active with them for [facility].  She has home oxygen 1 liter with [employer], w/chair and hoyer lift.   States family member, Williemae Natter  will transport her  home at [employer] and Britta Mccreedy and Sonny Masters  is support system, states gets medications from [facility] on [address].  Pta w/chair bound.  Plan to dc tomorrow. Will need HHRN order to resume.   Expected Discharge Plan: Home w Home Health Services Barriers to Discharge: Continued Medical Work up   Patient Goals and CMS Choice Patient states their goals for this hospitalization and ongoing recovery are:: return home CMS Medicare.gov Compare Post Acute Care list provided to:: Patient Choice offered to / list presented to : Patient      Expected Discharge Plan and Services In-house Referral: NA Discharge Planning Services: CM Consult Post Acute Care Choice: Home Health Living arrangements for the past 2 months: Single Family Home                 DME Arranged: N/A DME Agency: NA       HH Arranged: RN HH Agency: [facility] Date HH Agency Contacted: 11/19/23 Time HH Agency Contacted: 1605 Representative spoke with at [facility] Agency: Brandi  Prior Living Arrangements/Services Living arrangements for the past 2 months: Single Family Home Lives with:: Siblings Britta Mccreedy, sister) Patient language and need for interpreter reviewed:: Yes Do you feel safe going back to the place where you live?: Yes      Need for Family  Participation in Patient Care: Yes (Comment) Care giver support system in place?: Yes (comment) Current home services: DME (w/chair, hoyer lift, HHRN [facility],  private duty aide) Criminal Activity/Legal Involvement Pertinent to Current Situation/Hospitalization: No - Comment as needed  Activities of Daily Living   ADL Screening (condition at time of admission) Independently performs ADLs?: No (family has been helping no changes  verbilized) Does the patient have a NEW difficulty with bathing/dressing/toileting/self-feeding that is expected to last >3 days?: No Does the patient have a NEW difficulty with getting in/out of bed, walking, or climbing stairs that is expected to last >3 days?: No Does the patient have a NEW difficulty with communication that is expected to last >3 days?: No Is the patient deaf or have difficulty hearing?: No Does the patient have difficulty seeing, even when wearing glasses/contacts?: No Does the patient have difficulty concentrating, remembering, or making decisions?: No  Permission Sought/Granted Permission sought to share information with : Case Manager Permission granted to share information with : Yes, Verbal Permission Granted              Emotional Assessment Appearance:: Appears stated age Attitude/Demeanor/Rapport: Engaged Affect (typically observed): Appropriate Orientation: : Oriented to Self, Oriented to Place, Oriented to  Time, Oriented to Situation Alcohol / Substance Use: Not Applicable Psych Involvement: No (comment)  Admission diagnosis:  Hypokalemia [E87.6] Hypoglycemia [  E16.2] Acute on chronic respiratory failure with hypoxia (HCC) [J96.21] Patient Active Problem List   Diagnosis Date Noted   History of CVA (cerebrovascular accident) 11/17/2023   Acute on chronic respiratory failure with hypoxia (HCC) 11/17/2023   Chronic respiratory failure (HCC) 11/17/2023   Obesity hypoventilation syndrome (HCC) 11/17/2023   Hypoglycemia  11/17/2023   Hallux malleus 07/04/2022   Pulmonary HTN (HCC) 06/25/2022   Diabetic retinopathy (HCC) 03/27/2022   Hypercholesterolemia 03/27/2022   Leg ulcer (HCC) 03/27/2022   At risk for adverse drug reaction 10/13/2021   Diabetic peripheral neuropathy (HCC)    Essential hypertension    Wound of left foot 07/26/2021   Unable to perform bed mobility without assistance 07/26/2021   Pressure ulcer 06/29/2021   Stiffness due to immobility 05/18/2021   Healthcare maintenance 02/17/2021   Mild cognitive impairment 09/03/2020   Presbyopia 09/03/2020   Chronic lower back pain 07/27/2020   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 06/25/20 06/26/2020   Chronic DOAC therapy 03/08/2020   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) 03/08/2020   History of Falls with injury 03/08/2020   Hypoalbuminemia 03/08/2020   Complete heart block (HCC) 03/08/2020   Secondary hypercoagulable state (HCC) 11/12/2019   Dependent for wheelchair mobility 08/05/2019   OSA (obstructive sleep apnea) 05/27/2019   Paroxysmal atrial fibrillation (HCC) 05/27/2019   Insulin pump in place    Chronic diastolic CHF (congestive heart failure) (HCC) 03/17/2019   Depression 10/29/2018   Lower extremity edema 08/20/2013   Morbid obesity (HCC) 05/29/2013   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 [place] [address] Phone: [phone] Fax: [phone]     Social Determinants of Health (SDOH) Social History: SDOH Screenings   Food  Insecurity: Food Insecurity Present (11/17/2023)  Housing: Low Risk  (11/17/2023)  Transportation Needs: No Transportation Needs (11/17/2023)  Utilities: Not At Risk (11/17/2023)  Depression (PHQ2-9): Low Risk  (12/29/2021)  Recent Concern: Depression (PHQ2-9) - Medium Risk (11/16/2021)  Tobacco Use: Low Risk  (11/17/2023)   SDOH Interventions:     Readmission Risk Interventions    02/13/2022    1:31 PM  Readmission Risk Prevention Plan  Transportation Screening Complete  PCP or Specialist Appt within 3-5 Days Complete  HRI or Home Care Consult Complete  Social Work Consult for Recovery Care Planning/Counseling Complete  Palliative Care Screening Not Applicable  Medication Review Oceanographer) Complete

## 2023-11-20 DIAGNOSIS — J9621 Acute and chronic respiratory failure with hypoxia: Secondary | ICD-10-CM | POA: Diagnosis not present

## 2023-11-20 LAB — CBC WITH DIFFERENTIAL/PLATELET
Abs Immature Granulocytes: 0.01 10*3/uL (ref 0.00–0.07)
Basophils Absolute: 0 10*3/uL (ref 0.0–0.1)
Basophils Relative: 1 %
Eosinophils Absolute: 0.1 10*3/uL (ref 0.0–0.5)
Eosinophils Relative: 2 %
HCT: 31.1 % — ABNORMAL LOW (ref 36.0–46.0)
Hemoglobin: 9.7 g/dL — ABNORMAL LOW (ref 12.0–15.0)
Immature Granulocytes: 0 %
Lymphocytes Relative: 28 %
Lymphs Abs: 1.6 10*3/uL (ref 0.7–4.0)
MCH: 28.7 pg (ref 26.0–34.0)
MCHC: 31.2 g/dL (ref 30.0–36.0)
MCV: 92 fL (ref 80.0–100.0)
Monocytes Absolute: 0.6 10*3/uL (ref 0.1–1.0)
Monocytes Relative: 11 %
Neutro Abs: 3.3 10*3/uL (ref 1.7–7.7)
Neutrophils Relative %: 58 %
Platelets: 130 10*3/uL — ABNORMAL LOW (ref 150–400)
RBC: 3.38 MIL/uL — ABNORMAL LOW (ref 3.87–5.11)
RDW: 12.7 % (ref 11.5–15.5)
WBC: 5.7 10*3/uL (ref 4.0–10.5)
nRBC: 0 % (ref 0.0–0.2)

## 2023-11-20 LAB — BASIC METABOLIC PANEL
Anion gap: 7 (ref 5–15)
BUN: 16 mg/dL (ref 8–23)
CO2: 27 mmol/L (ref 22–32)
Calcium: 8.3 mg/dL — ABNORMAL LOW (ref 8.9–10.3)
Chloride: 105 mmol/L (ref 98–111)
Creatinine, Ser: 1 mg/dL (ref 0.44–1.00)
GFR, Estimated: 60 mL/min — ABNORMAL LOW (ref >60)
Glucose, Bld: 210 mg/dL — ABNORMAL HIGH (ref 70–99)
Potassium: 3.4 mmol/L — ABNORMAL LOW (ref 3.5–5.1)
Sodium: 139 mmol/L (ref 135–145)

## 2023-11-20 LAB — MAGNESIUM: Magnesium: 2 mg/dL (ref 1.7–2.4)

## 2023-11-20 LAB — PHOSPHORUS: Phosphorus: 3.7 mg/dL (ref 2.5–4.6)

## 2023-11-20 LAB — GLUCOSE, CAPILLARY
Glucose-Capillary: 149 mg/dL — ABNORMAL HIGH (ref 70–99)
Glucose-Capillary: 180 mg/dL — ABNORMAL HIGH (ref 70–99)
Glucose-Capillary: 189 mg/dL — ABNORMAL HIGH (ref 70–99)
Glucose-Capillary: 228 mg/dL — ABNORMAL HIGH (ref 70–99)

## 2023-11-20 MED ORDER — POTASSIUM CHLORIDE 20 MEQ PO PACK
40.0000 meq | PACK | Freq: Once | ORAL | Status: AC
  Administered 2023-11-20: 40 meq via ORAL
  Filled 2023-11-20: qty 2

## 2023-11-20 MED ORDER — SCOPOLAMINE 1 MG/3DAYS TD PT72: 1.0000 | MEDICATED_PATCH | TRANSDERMAL | Status: DC

## 2023-11-20 MED ORDER — SCOPOLAMINE 1 MG/3DAYS TD PT72
1.0000 | MEDICATED_PATCH | Freq: Once | TRANSDERMAL | Status: DC | PRN
  Administered 2023-11-20: 1.5 mg via TRANSDERMAL
  Filled 2023-11-20: qty 1

## 2023-11-20 NOTE — TOC Transition Note (Signed)
Transition of Care [facility]) - CM/SW Discharge Note   Patient Details  Name: [name] MRN: [id number] Date of Birth: Apr 27, 1951  Transition [facility] Care [facility]) CM/SW Contact:  Leone Haven, RN Phone Number: 11/20/2023, 11:10 AM   Clinical Narrative:    For dc today, NCM spoke with Candice , the patient 's niece , she will be here in an hour to pick patient up and would like for her to be ready.  Candice is bringing oxygen tank with her to transport her home.  NCM notified Brandi with [facility] of dc today.       Barriers to Discharge: Continued Medical Work up   Patient Goals and CMS Choice CMS Medicare.gov Compare Post Acute Care list provided to:: Patient Choice offered to / list presented to : Patient  Discharge Placement                         Discharge Plan and Services Additional resources added to the After Visit Summary for   In-house Referral: NA Discharge Planning Services: CM Consult Post Acute Care Choice: Home Health          DME Arranged: N/A DME Agency: NA       HH Arranged: RN HH Agency: [facility] Date HH Agency Contacted: 11/19/23 Time HH Agency Contacted: 1605 Representative spoke with at [facility] Agency: Merry Proud  Social Determinants of Health (SDOH) Interventions SDOH Screenings   Food Insecurity: Food Insecurity Present (11/17/2023)  Housing: Low Risk  (11/17/2023)  Transportation Needs: No Transportation Needs (11/17/2023)  Utilities: Not At Risk (11/17/2023)  Depression (PHQ2-9): Low Risk  (12/29/2021)  Recent Concern: Depression (PHQ2-9) - Medium Risk (11/16/2021)  Tobacco Use: Low Risk  (11/17/2023)     Readmission Risk Interventions    02/13/2022    1:31 PM  Readmission Risk Prevention Plan  Transportation Screening Complete  PCP or Specialist Appt within 3-5 Days Complete  HRI or Home Care Consult Complete  Social Work Consult for Recovery Care Planning/Counseling Complete  Palliative Care  Screening Not Applicable  Medication Review Oceanographer) Complete

## 2023-11-20 NOTE — Discharge Summary (Signed)
Physician Discharge Summary  [name] WJX:[id number] DOB: 07/23/1951 DOA: 11/17/2023  PCP: Irven Coe, MD  Admit date: 11/17/2023  Discharge date: 11/20/2023  Admitted From: Home.  Disposition:  Home  Recommendations for Outpatient Follow-up:  Follow up with PCP in 1-2 weeks. Please obtain BMP/CBC in one week. Advised to follow-up with Pulmonology as scheduled for recurrence of hemoptysis. Advised to hold metoprolol due to bradycardia.  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  Patient reportedly took insulin at around 4 AM.  Family is unsure why she took it but believes that her glucometer may have had high reading.  Later family found patient was unresponsive.  When EMS arrived they noticed patient with candy bar in her mouth possibly trying to self-correct.  CBG was 42 when EMS checked,  She was given glucagon with improvement in her glucose but continued to remain unresponsive.  She has received D10 infusion en route to the ED.  She became more responsive on arrival in the ED.  She was noted to be hypoxic on room air,  requiring up to 6 L of supplemental oxygen on arrival and then weaned down to 2 L.  Significant labs in the ED potassium 2.9,  INR 1.2, BNP 212, troponin 68.  Lactic acid - x 2. Chest x-ray shows mild pulmonary edema/CHF.  CT head negative.  Patient empirically started on ceftriaxone,  Zithromax and was given a dose of IV Lasix and started on D5 infusion.  Patient was admitted for further evaluation. Patient has made significant improvement.  She is successfully weaned down to 1 L of supplemental oxygen which is her baseline.  Her blood sugar has improved and She is resumed on her insulin at a lower dose.  Patient coughed up small amount of blood twice, Pulmonology consulted, states it could be multifactorial in the setting of CHF, pulmonary edema.  No indication for bronchoscopy at this time.  Patient felt much better , denies any further hemoptysis.  She wants to be discharged.  Patient is being discharged home.   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)   Hypothyroidism, postsurgical   GERD (gastroesophageal reflux disease)   Morbid obesity (HCC)   Depression   Insulin pump in place   OSA (obstructive sleep apnea)   Paroxysmal atrial fibrillation (HCC)   Dependent for wheelchair mobility   Complete heart block (HCC)   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 06/25/20   Chronic lower back pain   Mild cognitive impairment   Essential hypertension   History of CVA (cerebrovascular accident)   Obesity hypoventilation syndrome (HCC)   Hypoglycemia  Type 1 diabetes with hypoglycemia: Patient presented following hypoglycemic event at home.   Patient was found unresponsive and required glucagon followed by D10 to improve responsiveness and blood sugar. Blood sugar again drifted down in the  ED and was continued on D5 infusion. Mentation has improved to baseline, D5W discontinued.  Monitor CBGs.  Resumed on sliding scale. Diabetic coordinator consulted, started on Semglee 12 units daily.   Acute on chronic hypoxic  respiratory failure: Could be multifactorial due to aspiration pneumonia, CHF. Patient uses around 1 L of supplemental oxygen at baseline at night. Chronic respiratory failure believed to be due to combination of OHS, pulmonary hypertension due to undertreated OSA as Patient is not compliant with BiPAP. Initially required 6 L of supplemental oxygen and has been weaned down to 2 L. Chest x-ray read as probable pulmonary edema though concern for possible aspiration component given she was found with some food in her mouth per reports. Initiated on  antibiotics ceftriaxone and Zithromax. Patient has received a dose of Lasix in the ED. Refuses to use BiPAP,  Continue supplemental oxygen only Last echo was in March with EF 60-65%, indeterminate diastolic function, normal RV function. Hold off on further antibiotics as there is no leukocytosis and if this is pneumonitis alone may not require an antibiotics, Continue Lasix 40 mg daily.  She is weaned down to 1 L of supplemental oxygen which is her baseline.   Hypertension Holding amlodipine, Lasix, losartan, metoprolol given low normal blood pressure. Resume home medicationS as blood pressure improved   GERD Continue pantoprazole 40 mg daily.   Diabetes mellitus I: Hypoglycemia as above.  No longer on insulin pump. At home She uses 20 units long-acting daily and SSI Started Semglee 12 units daily, continue sliding scale.   Atrial fibrillation with SVR: Holding metoprolol due to low normal blood pressure. Continued on Eliquis.   Complete heart block: Status post pacemaker.  Several paced beats on EKG in ED. Cardiology consulted recommended check for pacemaker interrogation.   Prolonged Qtc: Read as prolonged on EKG in the ED, but unclear if A-fib/paced rhythm may be interfering.   Avoid QTc prolonging medications in the meantime and let us repeat EKG shows improvement   Hypothyroidism: Continue Synthroid.   History of CVA: Residual  left hemiparesis and wheelchair dependence. Continue Eliquis   Mild cognitive impairment Depression: History of suicidal ideation per chart. Denies any recurrence,  suicidal ideation.   Obesity Diet discussed in detail.   Hemoptysis: Patient coughed up small amount of blood twice,  denies any chest pain. Continue Eliquis.  It could be secondary to pneumonitis. Pulmonology consulted, CXR > Cardiomegaly, Mild pulm congestion. No indication for bronchoscopy.  Hemoptysis resolved    Discharge Instructions  Discharge Instructions     Call MD for:  difficulty breathing, headache or visual disturbances   Complete by: As directed    Call MD for:  persistant dizziness or light-headedness   Complete by: As directed    Call MD for:  persistant nausea and vomiting   Complete by: As directed    Diet - low sodium heart healthy   Complete by: As directed    Discharge instructions   Complete by: As directed    Advised to follow-up with primary care physician in 1 week. Advised to follow-up with pulmonology as scheduled for recurrence of hemoptysis. Advised to hold metoprolol due to bradycardia.   Discharge wound care:   Complete by: As directed    Follow-up PCP in 1 week.   Increase activity slowly   Complete by: As directed       Allergies as of 11/20/2023       Reactions   Latex Rash  Medication List     STOP taking these medications    metoprolol succinate 50 MG 24 hr tablet Commonly known as: TOPROL-XL       TAKE these medications    acetaminophen 650 MG CR tablet Commonly known as: TYLENOL Take 650 mg by mouth daily as needed (severe pain).   albuterol 108 (90 Base) MCG/ACT inhaler Commonly known as: VENTOLIN HFA Inhale 2 puffs into the lungs every 6 (six) hours as needed for wheezing or shortness of breath.   amLODipine 2.5 MG tablet Commonly known as: NORVASC Take 2.5 mg by mouth daily.   apixaban 5 MG Tabs tablet Commonly known as:  Eliquis Take 1 tablet (5 mg total) by mouth 2 (two) times daily.   atorvastatin 80 MG tablet Commonly known as: LIPITOR TAKE 1 TABLET(80 MG) BY MOUTH EVERY EVENING   b complex vitamins capsule Take 1 capsule by mouth daily.   busPIRone 5 MG tablet Commonly known as: BUSPAR TAKE 1 TABLET(5 MG) BY MOUTH THREE TIMES DAILY What changed: See the new instructions.   furosemide 40 MG tablet Commonly known as: LASIX Take 1 tablet (40 mg total) by mouth 2 (two) times daily.   gabapentin 300 MG capsule Commonly known as: NEURONTIN Take 1 capsule (300 mg total) by mouth as needed. What changed: when to take this   HumaLOG 100 UNIT/ML injection Generic drug: insulin lispro Inject 3-5 Units into the skin 3 (three) times daily with meals as needed for high blood sugar.   levothyroxine 175 MCG tablet Commonly known as: SYNTHROID TAKE 1 TABLET(175 MCG) BY MOUTH DAILY BEFORE BREAKFAST   loratadine 10 MG tablet Commonly known as: CLARITIN Take 10 mg by mouth daily.   losartan 25 MG tablet Commonly known as: COZAAR Take 1 tablet (25 mg total) by mouth daily.   MAGNESIUM-POTASSIUM PO Take 1 each by mouth every evening. Potassium-magnesium combination gummy.   OXYGEN Inhale 2 L into the lungs continuous.   pantoprazole 40 MG tablet Commonly known as: PROTONIX Take 1 tablet (40 mg total) by mouth 2 (two) times daily.   polyethylene glycol powder 17 GM/SCOOP powder Commonly known as: GLYCOLAX/MIRALAX Take 17 g by mouth 2 (two) times daily as needed for moderate constipation. What changed: when to take this   [place] FlexTouch 100 UNIT/ML FlexTouch Pen Generic drug: insulin degludec Inject 22 Units into the skin daily.   VITAMIN D-3 PO Take 1 capsule by mouth daily.   [place]ixela Inhub 500-50 MCG/ACT Aepb Generic drug: fluticasone-salmeterol Inhale 1 puff into the lungs in the morning and at bedtime.               Discharge Care Instructions  (From admission, onward)            Start     Ordered   11/20/23 0000  Discharge wound care:       Comments: Follow-up PCP in 1 week.   11/20/23 1040            Follow-up Information     [facility] Follow up.   Specialty: Home Health Services Why: Agency will call you to set up apt times Contact information: [address] [place] [address] [phone]         Irven Coe, MD Follow up.   Specialty: Family Medicine Why: Office will call patient to make appointment once she is discharged. Contact information: [address] [place] [address] [phone]  Charlott Holler, MD Follow up in 2 week(s).   Specialty: Pulmonary Disease Contact information: [address] [place] [address] [phone]                Allergies  Allergen Reactions   Latex Rash    Consultations: Pulmonology   Procedures/Studies: DG Swallowing Func-Speech Pathology  Result Date: 11/19/2023 Table formatting from the original result was not included. Modified Barium Swallow Study Patient Details Name: Cossette Gatson MRN: [id number] Date of Birth: 04-02-1951 Today's Date: 11/19/2023 HPI/PMH: HPI: Jeanina Failing is a 72 yo female presenting to ED 11/23 with hypoglycemia, AMS, and worsening hypoxia. CTH negative for acute abnormalities. Most recently seen by SLP 01/2022 and recommended Dys 2 diet with thin liquids with known risk of aspiration after declining to use thickened liquids. MBS 11/2021 with aspiration of thin liquids due to mistiming and was recommended Dys 3 solids with nectar thick liquids. recommended PMH includes HTN, HLD, GERD, T1DM, A-fib, complete heart block s/p pacemaker, hypothyroidism, CVA with residual L hemiparesis and wheelchair dependence, MCI, depression, OSA, diastolic CHF, obesity, lower back pain, chronic hypoxia requiring 1L O2 at baseline Clinical Impression: Clinical Impression: Pt has had a decline in  swallowing function since last MBS, with increased volume and frequency of aspiration and subjective c/o trouble with solids. Her primary deficits continue to be with mistiming. She has incomplete laryngeal vestibule closure, laryngeal elevation, and hyoid excursion. Thin and nectar thick liquids spill into the airway prior to initiation of the swallow and before any attempts at closing the airway. Aspiration is mostly sensed via cough or pt report of "burning" but her cough does not clear the aspirates (PAS 7). Strategies that were found to be helpful in previous MBS are no longer effective. Pt had no penetration or aspiration with honey thick liquids or solids though, as they are better contained within the valleculae. Other than mastication that is slow, with pt taking rest breaks, and slow posterior transit with solids, her oral phase is grossly functional with solids. Pt reports feeling like she can't swallow solids though (reporting that they get stuck) even on current mechanical soft diet. Unable to perform esophageal sweep due to body habitus, but question if this could be a primary esophageal issue. She prefers to try Dys 2 (finely chopped) diet and is agreeable to trying honey thick liquids. Factors that may increase risk of adverse event in presence of aspiration Rubye Oaks & Clearance Coots 2021): Factors that may increase risk of adverse event in presence of aspiration Rubye Oaks & Clearance Coots 2021): Respiratory or GI disease; Weak cough; Aspiration of thick, dense, and/or acidic materials; Frequent aspiration of large volumes Recommendations/Plan: Swallowing Evaluation Recommendations Swallowing Evaluation Recommendations Recommendations: PO diet PO Diet Recommendation: Dysphagia 2 (Finely chopped); Moderately thick liquids (Level 3, honey thick) Liquid Administration via: Cup; Straw Medication Administration: Whole meds with liquid (honey thick) Supervision: Patient able to self-feed; Set-up assistance for safety;  Intermittent supervision/cueing for swallowing strategies Swallowing strategies  : Slow rate; Small bites/sips; Follow solids with liquids Postural changes: Position pt fully upright for meals; Stay upright 30-60 min after meals Oral care recommendations: Oral care BID (2x/day) Caregiver Recommendations: Avoid jello, ice cream, thin soups, popsicles; Remove water pitcher Treatment Plan Treatment Plan Treatment recommendations: Therapy as outlined in treatment plan below Follow-up recommendations: Home health SLP Functional status assessment: Patient has had a recent decline in their functional status and demonstrates the ability to make significant improvements in function in a reasonable  and predictable amount of time. Treatment frequency: Min 2x/week Treatment duration: 2 weeks Interventions: Aspiration precaution training; Compensatory techniques; Patient/family education; Trials of upgraded texture/liquids; Diet toleration management by SLP; Respiratory muscle strength training Recommendations Recommendations for follow up therapy are one component of a multi-disciplinary discharge planning process, led by the attending physician.  Recommendations may be updated based on patient status, additional functional criteria and insurance authorization. Assessment: Orofacial Exam: Orofacial Exam Oral Cavity - Dentition: Dentures, top; Dentures, bottom Anatomy: Anatomy: WFL Boluses Administered: Boluses Administered Boluses Administered: Thin liquids (Level 0); Mildly thick liquids (Level 2, nectar thick); Moderately thick liquids (Level 3, honey thick); Puree; Solid  Oral Impairment Domain: Oral Impairment Domain Lip Closure: -- (not able to observe on fluoro due to positioning but no overt loss noted outwardly) Tongue control during bolus hold: Cohesive bolus between tongue to palatal seal Bolus preparation/mastication: Slow prolonged chewing/mashing with complete recollection Bolus transport/lingual motion: Slow  tongue motion Oral residue: Residue collection on oral structures Location of oral residue : Tongue Initiation of pharyngeal swallow : Posterior laryngeal surface of the epiglottis (trachea)  Pharyngeal Impairment Domain: Pharyngeal Impairment Domain Soft palate elevation: No bolus between soft palate (SP)/pharyngeal wall (PW) Laryngeal elevation: Partial superior movement of thyroid cartilage/partial approximation of arytenoids to epiglottic petiole Anterior hyoid excursion: Partial anterior movement Epiglottic movement: Complete inversion Laryngeal vestibule closure: Incomplete, narrow column air/contrast in laryngeal vestibule Pharyngeal stripping wave : Present - complete Pharyngeal contraction (A/P view only): N/A Pharyngoesophageal segment opening: Complete distension and complete duration, no obstruction of flow Tongue base retraction: No contrast between tongue base and posterior pharyngeal wall (PPW) Pharyngeal residue: Complete pharyngeal clearance Location of pharyngeal residue: N/A  Esophageal Impairment Domain: Esophageal Impairment Domain Esophageal clearance upright position: -- (unable to scan lower due to body habitus) Pill: Pill Consistency administered: Moderately thick liquids (Level 3, honey thick) Moderately thick liquids (Level 3, honey thick): WFL Penetration/Aspiration Scale Score: Penetration/Aspiration Scale Score 1.  Material does not enter airway: Moderately thick liquids (Level 3, honey thick); Puree; Solid; Pill 7.  Material enters airway, passes BELOW cords and not ejected out despite cough attempt by patient: Thin liquids (Level 0); Mildly thick liquids (Level 2, nectar thick) Compensatory Strategies: Compensatory Strategies Compensatory strategies: Yes Straw: Ineffective; Effective Effective Straw: Moderately thick liquid (Level 3, honey thick) Ineffective Straw: Mildly thick liquid (Level 2, nectar thick) Oral bolus hold: Ineffective Ineffective Oral Bolus Hold : Thin liquid (Level  0); Mildly thick liquid (Level 2, nectar thick)   General Information: Caregiver present: No  Diet Prior to this Study: Dysphagia 3 (mechanical soft); Mildly thick liquids (Level 2, nectar thick)   Temperature : Normal   Respiratory Status: WFL   Supplemental O2: Nasal cannula   History of Recent Intubation: No  Behavior/Cognition: Alert; Cooperative; Pleasant mood Self-Feeding Abilities: Able to self-feed Baseline vocal quality/speech: Normal Volitional Cough: Able to elicit Volitional Swallow: Able to elicit Exam Limitations: No limitations Goal Planning: Prognosis for improved oropharyngeal function: Good Barriers to Reach Goals: Time post onset No data recorded Patient/Family Stated Goal: wants to feel better Consulted and agree with results and recommendations: Patient Pain: Pain Assessment Pain Assessment: Faces Faces Pain Scale: 0 End of Session: Start Time:SLP Start Time (ACUTE ONLY): 0820 Stop Time: SLP Stop Time (ACUTE ONLY): 0844 Time Calculation:SLP Time Calculation (min) (ACUTE ONLY): 24 min Charges: SLP Evaluations $ SLP Speech Visit: 1 Visit SLP Evaluations $BSS Swallow: 1 Procedure $MBS Swallow: 1 Procedure SLP visit diagnosis: SLP Visit Diagnosis: Dysphagia, oropharyngeal  phase (R13.12) Past Medical History: Past Medical History: Diagnosis Date  Abnormal echocardiogram   Anemia   Arthritis   BPPV (benign paroxysmal positional vertigo), right 06/13/2017  Bradycardia 03/07/2020  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  CHB (complete heart block) (HCC) 03/08/2020  CHF (congestive heart failure) (HCC)   Closed fracture of left proximal humerus 07/08/2019  Decreased range of motion of left shoulder 09/03/2020  Depression   Dizzy 03/07/2020  bradycardia  Edentulous 09/03/2020  GERD (gastroesophageal reflux disease)   Hemiparesis affecting left side as late effect of cerebrovascular accident (CVA) (HCC) 03/08/2020  Hemorrhoids   History of Falls with injury  03/08/2020  Fall resulting in left humeral fracture  Hyperlipidemia   Hypertension   Hypothyroidism   Intracranial vascular stenosis   Lacunar infarction (HCC)   Brain MRI 07/2021 multiple small remote pontine lacunar infarcts  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  Severe chronic microvascular ischemic disease of the pons, left middle cerebellar peduncle, and mini remote lacunar infarcts, progressive since 2019.  Remote pontine microhemorrhage most likely of hypertensive etiology.  10/10/21 CT reveals chronic atrophic changes.    Malignant tumor of thyroid gland (HCC) 03/27/2022  Near syncope 03/08/2020  Paroxysmal atrial fibrillation (HCC)   Presence of permanent cardiac pacemaker   Proteinuria 03/08/2020  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 06/25/20 06/26/2020  Seasonal allergies   Stroke (HCC)   Suicidal ideation 11/16/2021  Thyroid cancer (HCC)   per pt S/p Total Thyroidectomy with Radioactive Iodine Therapy  Type 1 diabetes mellitus (HCC)  Past Surgical History: Past Surgical History: Procedure Laterality Date  ABDOMINAL ANGIOGRAM N/A 05/14/2012  Procedure: ABDOMINAL ANGIOGRAM;  Surgeon: [name], MD;  Location: [facility] CATH LAB;  Service: Cardiovascular;  Laterality: N/A;  ABDOMINAL HYSTERECTOMY  1995  partial  ANKLE FRACTURE SURGERY Right   CARDIAC CATHETERIZATION    with coronary angiogram  cataract  Bilateral 2018  COLONOSCOPY N/A 09/09/2014  Procedure: COLONOSCOPY;  Surgeon: Iva Boop, MD;  Location: [facility] ENDOSCOPY;  Service: Endoscopy;  Laterality: N/A;  ESOPHAGOGASTRODUODENOSCOPY (EGD) WITH PROPOFOL N/A 12/03/2018  Procedure: ESOPHAGOGASTRODUODENOSCOPY (EGD) WITH PROPOFOL;  Surgeon: Sherrilyn Rist, MD;  Location: [facility] ENDOSCOPY;  Service: Gastroenterology;  Laterality: N/A;  LEFT AND RIGHT HEART CATHETERIZATION WITH CORONARY ANGIOGRAM N/A 05/14/2012  Procedure:  LEFT AND RIGHT HEART CATHETERIZATION WITH CORONARY ANGIOGRAM;  Surgeon: [name], MD;  Location: [facility] CATH LAB;  Service: Cardiovascular;  Laterality: N/A;  LOOP RECORDER INSERTION N/A 12/23/2018  Procedure: LOOP RECORDER INSERTION;  Surgeon: Regan Lemming, MD;  Location: [facility] INVASIVE CV LAB;  Service: Cardiovascular;  Laterality: N/A;  LOOP RECORDER REMOVAL N/A 06/25/2020  Procedure: LOOP RECORDER REMOVAL;  Surgeon: Regan Lemming, MD;  Location: [facility] INVASIVE CV LAB;  Service: Cardiovascular;  Laterality: N/A;  MINI METER   09/08/2014  ELECTRONIC INSULIN PUMP  PACEMAKER IMPLANT N/A 06/25/2020  Procedure: PACEMAKER IMPLANT;  Surgeon: Regan Lemming, MD;  Location: [facility] INVASIVE CV LAB;  Service: Cardiovascular;  Laterality: N/A;  TEE WITHOUT CARDIOVERSION N/A 10/16/2018  Procedure: TRANSESOPHAGEAL ECHOCARDIOGRAM (TEE);  Surgeon: Jodelle Red, MD;  Location: [facility] ENDOSCOPY;  Service: Cardiovascular;  Laterality: N/A;  THYROIDECTOMY  2006  for thyroid cancer Mahala Menghini., M.A. CCC-SLP Acute Rehabilitation Services Office [phone] Secure chat preferred 11/19/2023, 10:38 AM  DG CHEST PORT 1 VIEW  Result Date: 11/18/2023 CLINICAL DATA:  Cough hemoptysis EXAM: PORTABLE CHEST 1 VIEW COMPARISON:  11/17/2023, 09/10/2023 FINDINGS: Similar appearance of left-sided pacing device. Electronic recording device over left lower chest. Cardiomegaly with vascular congestion and pulmonary edema, slightly increased compared to prior. Suspicion of trace right pleural effusion. Aortic atherosclerosis. No pneumothorax IMPRESSION: Cardiomegaly with vascular congestion and pulmonary edema, slightly increased compared to prior. Suspicion of trace right pleural effusion. Electronically Signed   By: Jasmine Pang M.D.   On: 11/18/2023 16:24   CT Head Wo Contrast  Result Date: 11/17/2023 CLINICAL DATA:  Mental status change of unknown cause. Hypoglycemia. Diabetic. EXAM: CT HEAD WITHOUT CONTRAST  TECHNIQUE: Contiguous axial images were obtained from the base of the skull through the vertex without intravenous contrast. RADIATION DOSE REDUCTION: This exam was performed according to the departmental dose-optimization program which includes automated exposure control, adjustment of the mA and/or kV according to patient size and/or use of iterative reconstruction technique. COMPARISON:  10/31/2021 CT.  08/05/2021 MRI. FINDINGS: Brain: Advanced chronic ischemic changes of the brainstem. No acute posterior fossa insult identified. Cerebral hemispheres show mild chronic small-vessel ischemic changes of the white matter. No sign of acute stroke, mass, hemorrhage, hydrocephalus or extra-axial collection. Vascular: There is atherosclerotic calcification of the major vessels at the base of the brain. Skull: Negative Sinuses/Orbits: Clear/normal Other: None IMPRESSION: No acute CT finding. Advanced chronic ischemic changes of the brainstem. Mild chronic small-vessel ischemic changes of the cerebral hemispheric white matter. Electronically Signed   By: Paulina Fusi M.D.   On: 11/17/2023 13:02   DG Chest Port 1 View  Result Date: 11/17/2023 CLINICAL DATA:  Questionable sepsis - evaluate for abnormality. EXAM: PORTABLE CHEST 1 VIEW COMPARISON:  09/10/2023. FINDINGS: Low lung volume. There is diffuse pulmonary vascular congestion. There are probable atelectatic changes at the lung bases. No acute consolidation or lung collapse. Bilateral lateral costophrenic angles are clear. Stable mildly enlarged cardio-mediastinal silhouette. There is a left sided 2-lead pacemaker. Presumed loop recorder device noted overlying the left lower chest wall. No acute osseous abnormalities. The soft tissues are within normal limits. IMPRESSION: *Probable mild congestive heart failure/pulmonary edema. Electronically Signed   By: Jules Schick M.D.   On: 11/17/2023 11:30     Subjective: Patient was seen and examined at bedside.   Overnight events noted.   Patient reports feeling much improved.  She is back to her baseline oxygen requirement. She denies any further episodes of hemoptysis.  Discharge Exam: Vitals:   11/20/23 0351 11/20/23 0721  BP: (!) 146/46 (!) 139/53  Pulse: (!) 50 (!) 55  Resp: 16 20  Temp: 98.1 F (36.7 C) 97.8 F (36.6 C)  SpO2: 97% 94%   Vitals:   11/19/23 2007 11/20/23 0033 11/20/23 0351 11/20/23 0721  BP: (!) 128/49 (!) 124/51 (!) 146/46 (!) 139/53  Pulse: (!) 55 (!) 50 (!) 50 (!) 55  Resp: 20 17 16 20   Temp: 98.6 F (37 C) 98 F (36.7 C) 98.1 F (36.7 C) 97.8 F (36.6 C)  TempSrc: Oral Oral Oral Oral  SpO2: 91% 92% 97% 94%  Weight:   116.1 kg   Height:        General: Pt is alert, awake, not in acute distress Cardiovascular: RRR, S1/S2 +, no rubs, no gallops Respiratory: CTA bilaterally, no wheezing, no rhonchi Abdominal: Soft, NT, ND, bowel sounds + Extremities: no edema, no cyanosis    The results of significant diagnostics from this hospitalization (including imaging, microbiology, ancillary and laboratory) are listed below for reference.  Microbiology: Recent Results (from the past 240 hour(s))  Blood Culture (routine x 2)     Status: None (Preliminary result)   Collection Time: 11/17/23 11:03 AM   Specimen: BLOOD LEFT HAND  Result Value Ref Range Status   Specimen Description BLOOD LEFT HAND  Final   Special Requests   Final    BOTTLES DRAWN AEROBIC AND ANAEROBIC Blood Culture adequate volume   Culture   Final    NO GROWTH 3 DAYS Performed at [facility], [address] [place], [place] [address]    Report Status PENDING  Incomplete  Blood Culture (routine x 2)     Status: None (Preliminary result)   Collection Time: 11/17/23 11:03 AM   Specimen: BLOOD RIGHT FOREARM  Result Value Ref Range Status   Specimen Description BLOOD RIGHT FOREARM  Final   Special Requests   Final    BOTTLES DRAWN AEROBIC AND ANAEROBIC Blood Culture adequate volume    Culture   Final    NO GROWTH 3 DAYS Performed at [facility], [address], [place], [place] [address]    Report Status PENDING  Incomplete     Labs: BNP (last 3 results) Recent Labs    11/17/23 1103  BNP 212.5*   Basic Metabolic Panel: Recent Labs  Lab 11/17/23 1103 11/17/23 1126 11/18/23 0328 11/19/23 0229 11/20/23 0235  NA 140 142 138 137 139  K 2.9* 3.9 3.9 3.6 3.4*  CL 104 105 104 104 105  CO2 24  --  23 26 27   GLUCOSE 81 78 245* 154* 210*  BUN 15 22 16 17 16   CREATININE 0.95 1.00 1.29* 1.01* 1.00  CALCIUM 8.8*  --  8.3* 8.5* 8.3*  MG 1.9  --   --  1.7 2.0  PHOS  --   --   --  2.4* 3.7   Liver Function Tests: Recent Labs  Lab 11/17/23 1103 11/18/23 0328  AST 26 20  ALT 29 23  ALKPHOS 71 60  BILITOT 0.5 1.1  PROT 6.6 6.0*  ALBUMIN 3.0* 2.7*   No results for input(s): "LIPASE", "AMYLASE" in the last 168 hours. No results for input(s): "AMMONIA" in the last 168 hours. CBC: Recent Labs  Lab 11/17/23 1103 11/17/23 1126 11/18/23 0328 11/19/23 0229 11/20/23 0235  WBC 8.9  --  8.8 6.6 5.7  NEUTROABS 7.8*  --   --   --  3.3  HGB 10.3* 11.6* 9.9* 10.2* 9.7*  HCT 34.1* 34.0* 31.5* 32.1* 31.1*  MCV 96.1  --  94.3 92.8 92.0  PLT 143*  --  126* 133* 130*   Cardiac Enzymes: No results for input(s): "CKTOTAL", "CKMB", "CKMBINDEX", "TROPONINI" in the last 168 hours. BNP: Invalid input(s): "POCBNP" CBG: Recent Labs  Lab 11/19/23 2004 11/20/23 0031 11/20/23 0357 11/20/23 0719 11/20/23 1035  GLUCAP 244* 189* 180* 149* 228*   D-Dimer No results for input(s): "DDIMER" in the last 72 hours. Hgb A1c Recent Labs    11/18/23 0328  HGBA1C 7.0*   Lipid Profile No results for input(s): "CHOL", "HDL", "LDLCALC", "TRIG", "CHOLHDL", "LDLDIRECT" in the last 72 hours. Thyroid function studies No results for input(s): "TSH", "T4TOTAL", "T3FREE", "THYROIDAB" in the last 72 hours.  Invalid input(s): "FREET3" Anemia work up No results for  input(s): "VITAMINB12", "FOLATE", "FERRITIN", "TIBC", "IRON", "RETICCTPCT" in the last 72 hours. Urinalysis    Component Value Date/Time   COLORURINE YELLOW 11/17/2023 1210   APPEARANCEUR CLEAR 11/17/2023 1210   LABSPEC 1.016 11/17/2023  1210   PHURINE 5.0 11/17/2023 1210   GLUCOSEU NEGATIVE 11/17/2023 1210   HGBUR NEGATIVE 11/17/2023 1210   BILIRUBINUR NEGATIVE 11/17/2023 1210   BILIRUBINUR negative 10/29/2018 1430   BILIRUBINUR Negative 11/24/2016 0922   KETONESUR NEGATIVE 11/17/2023 1210   PROTEINUR NEGATIVE 11/17/2023 1210   UROBILINOGEN 0.2 10/29/2018 1430   UROBILINOGEN 0.2 08/29/2015 0947   NITRITE NEGATIVE 11/17/2023 1210   LEUKOCYTESUR NEGATIVE 11/17/2023 1210   Sepsis Labs Recent Labs  Lab 11/17/23 1103 11/18/23 0328 11/19/23 0229 11/20/23 0235  WBC 8.9 8.8 6.6 5.7   Microbiology Recent Results (from the past 240 hour(s))  Blood Culture (routine x 2)     Status: None (Preliminary result)   Collection Time: 11/17/23 11:03 AM   Specimen: BLOOD LEFT HAND  Result Value Ref Range Status   Specimen Description BLOOD LEFT HAND  Final   Special Requests   Final    BOTTLES DRAWN AEROBIC AND ANAEROBIC Blood Culture adequate volume   Culture   Final    NO GROWTH 3 DAYS Performed at [facility], [name], [place], [place] [address]    Report Status PENDING  Incomplete  Blood Culture (routine x 2)     Status: None (Preliminary result)   Collection Time: 11/17/23 11:03 AM   Specimen: BLOOD RIGHT FOREARM  Result Value Ref Range Status   Specimen Description BLOOD RIGHT FOREARM  Final   Special Requests   Final    BOTTLES DRAWN AEROBIC AND ANAEROBIC Blood Culture adequate volume   Culture   Final    NO GROWTH 3 DAYS Performed at [facility], [address] [place], [place] [address]    Report Status PENDING  Incomplete     Time coordinating discharge: Over 30 minutes  SIGNED:   Willeen Niece, MD  [facility] 11/20/2023, 10:40  AM Pager   If 7PM-7AM, please contact night-coverage

## 2023-11-20 NOTE — Discharge Instructions (Signed)
Advised to follow-up with pulmonology as scheduled for recurrence of hemoptysis. Advised to hold metoprolol due to bradycardia.

## 2023-11-20 NOTE — Consult Note (Signed)
[facility] Liaison Consult Note    11/20/2023  Sheria Lang Judithe Rubach [birth year] [id number]  Insurance: [employer] Medicare   Primary Care Provider: Irven Coe, MD listed with Deboraha Sprang at [facility], this provider is listed for the transition of care follow up appointments  and Transitional calls   [facility] Liaison met patient at bedside at [facility]. Patient is up in bedside chair. Patient currently admitted with hypoglycemia, patient is in observation status [3 days].   The patient was screened for request for post hospital follow up appointment with [facility] provider. Patient has personal care services with Arnetha Courser- F 11- 2 pm and Sat/Sun 12-2 pm. Has HH with [facility]. The patient was assessed for potential Community Care Coordination service needs for post hospital transition for care coordination. Review of patient's electronic medical record reveals patient is for home today..   Plan: [facility] Liaison will continue to follow progress and disposition to asess for post hospital community care coordination/management needs.  Referral request for community care coordination: will alert the [facility] Transition team of needs.   [facility] Community [facility], Population Health does not replace or interfere with any arrangements made by the Inpatient Transition of Care team.   For questions contact:   Charlesetta Shanks, RN, BSN, CCM   [facility], Population Health, [facility] Liaison Direct Dial: [phone] or secure chat Email: [email or web address]

## 2023-11-20 NOTE — Progress Notes (Signed)
Mobility Specialist Progress Note:    11/20/23 1024  Mobility  Activity Transferred from bed to chair  Level of Assistance +2 (takes two people) (MaxA)  Assistive Device Stedy  Activity Response Tolerated well  Mobility Referral Yes  $Mobility charge 1 Mobility  Mobility Specialist Start Time (ACUTE ONLY) 0940  Mobility Specialist Stop Time (ACUTE ONLY) 1004  Mobility Specialist Time Calculation (min) (ACUTE ONLY) 24 min   Pt received in bed requesting to get to chair. Pt needed ModA+2 to get to EOB and MaxA+2 to stand. Transferred to the chair w/o fault. No c/o throughout. Call bell and personal belongings in reach. All needs met w/ family in room.  Thompson Grayer Mobility Specialist  Please contact vis Secure Chat or  Rehab Office [phone]

## 2023-11-21 DIAGNOSIS — Z7901 Long term (current) use of anticoagulants: Secondary | ICD-10-CM | POA: Diagnosis not present

## 2023-11-21 DIAGNOSIS — E1142 Type 2 diabetes mellitus with diabetic polyneuropathy: Secondary | ICD-10-CM | POA: Diagnosis not present

## 2023-11-21 DIAGNOSIS — I5032 Chronic diastolic (congestive) heart failure: Secondary | ICD-10-CM | POA: Diagnosis not present

## 2023-11-21 DIAGNOSIS — Z794 Long term (current) use of insulin: Secondary | ICD-10-CM | POA: Diagnosis not present

## 2023-11-21 DIAGNOSIS — I480 Paroxysmal atrial fibrillation: Secondary | ICD-10-CM | POA: Diagnosis not present

## 2023-11-21 DIAGNOSIS — E7800 Pure hypercholesterolemia, unspecified: Secondary | ICD-10-CM | POA: Diagnosis not present

## 2023-11-21 DIAGNOSIS — Z9181 History of falling: Secondary | ICD-10-CM | POA: Diagnosis not present

## 2023-11-21 DIAGNOSIS — Z79891 Long term (current) use of opiate analgesic: Secondary | ICD-10-CM | POA: Diagnosis not present

## 2023-11-21 DIAGNOSIS — Z87440 Personal history of urinary (tract) infections: Secondary | ICD-10-CM | POA: Diagnosis not present

## 2023-11-21 DIAGNOSIS — I110 Hypertensive heart disease with heart failure: Secondary | ICD-10-CM | POA: Diagnosis not present

## 2023-11-21 DIAGNOSIS — Z85850 Personal history of malignant neoplasm of thyroid: Secondary | ICD-10-CM | POA: Diagnosis not present

## 2023-11-21 DIAGNOSIS — I69354 Hemiplegia and hemiparesis following cerebral infarction affecting left non-dominant side: Secondary | ICD-10-CM | POA: Diagnosis not present

## 2023-11-21 DIAGNOSIS — Z9981 Dependence on supplemental oxygen: Secondary | ICD-10-CM | POA: Diagnosis not present

## 2023-11-21 DIAGNOSIS — M1990 Unspecified osteoarthritis, unspecified site: Secondary | ICD-10-CM | POA: Diagnosis not present

## 2023-11-21 DIAGNOSIS — I272 Pulmonary hypertension, unspecified: Secondary | ICD-10-CM | POA: Diagnosis not present

## 2023-11-21 DIAGNOSIS — K5900 Constipation, unspecified: Secondary | ICD-10-CM | POA: Diagnosis not present

## 2023-11-21 DIAGNOSIS — G4733 Obstructive sleep apnea (adult) (pediatric): Secondary | ICD-10-CM | POA: Diagnosis not present

## 2023-11-21 DIAGNOSIS — K219 Gastro-esophageal reflux disease without esophagitis: Secondary | ICD-10-CM | POA: Diagnosis not present

## 2023-11-21 DIAGNOSIS — E11319 Type 2 diabetes mellitus with unspecified diabetic retinopathy without macular edema: Secondary | ICD-10-CM | POA: Diagnosis not present

## 2023-11-22 LAB — CULTURE, BLOOD (ROUTINE X 2)
Culture: NO GROWTH
Culture: NO GROWTH
Special Requests: ADEQUATE
Special Requests: ADEQUATE

## 2023-11-23 DIAGNOSIS — Z85850 Personal history of malignant neoplasm of thyroid: Secondary | ICD-10-CM | POA: Diagnosis not present

## 2023-11-23 DIAGNOSIS — Z794 Long term (current) use of insulin: Secondary | ICD-10-CM | POA: Diagnosis not present

## 2023-11-23 DIAGNOSIS — E7800 Pure hypercholesterolemia, unspecified: Secondary | ICD-10-CM | POA: Diagnosis not present

## 2023-11-23 DIAGNOSIS — I272 Pulmonary hypertension, unspecified: Secondary | ICD-10-CM | POA: Diagnosis not present

## 2023-11-23 DIAGNOSIS — Z7901 Long term (current) use of anticoagulants: Secondary | ICD-10-CM | POA: Diagnosis not present

## 2023-11-23 DIAGNOSIS — I5032 Chronic diastolic (congestive) heart failure: Secondary | ICD-10-CM | POA: Diagnosis not present

## 2023-11-23 DIAGNOSIS — Z87440 Personal history of urinary (tract) infections: Secondary | ICD-10-CM | POA: Diagnosis not present

## 2023-11-23 DIAGNOSIS — G4733 Obstructive sleep apnea (adult) (pediatric): Secondary | ICD-10-CM | POA: Diagnosis not present

## 2023-11-23 DIAGNOSIS — I110 Hypertensive heart disease with heart failure: Secondary | ICD-10-CM | POA: Diagnosis not present

## 2023-11-23 DIAGNOSIS — K219 Gastro-esophageal reflux disease without esophagitis: Secondary | ICD-10-CM | POA: Diagnosis not present

## 2023-11-23 DIAGNOSIS — Z79891 Long term (current) use of opiate analgesic: Secondary | ICD-10-CM | POA: Diagnosis not present

## 2023-11-23 DIAGNOSIS — Z9181 History of falling: Secondary | ICD-10-CM | POA: Diagnosis not present

## 2023-11-23 DIAGNOSIS — E1142 Type 2 diabetes mellitus with diabetic polyneuropathy: Secondary | ICD-10-CM | POA: Diagnosis not present

## 2023-11-23 DIAGNOSIS — Z9981 Dependence on supplemental oxygen: Secondary | ICD-10-CM | POA: Diagnosis not present

## 2023-11-23 DIAGNOSIS — I69354 Hemiplegia and hemiparesis following cerebral infarction affecting left non-dominant side: Secondary | ICD-10-CM | POA: Diagnosis not present

## 2023-11-23 DIAGNOSIS — M1990 Unspecified osteoarthritis, unspecified site: Secondary | ICD-10-CM | POA: Diagnosis not present

## 2023-11-23 DIAGNOSIS — E11319 Type 2 diabetes mellitus with unspecified diabetic retinopathy without macular edema: Secondary | ICD-10-CM | POA: Diagnosis not present

## 2023-11-23 DIAGNOSIS — K5900 Constipation, unspecified: Secondary | ICD-10-CM | POA: Diagnosis not present

## 2023-11-23 DIAGNOSIS — I480 Paroxysmal atrial fibrillation: Secondary | ICD-10-CM | POA: Diagnosis not present

## 2023-11-27 ENCOUNTER — Telehealth: Payer: Self-pay | Admitting: Pulmonary Disease

## 2023-11-27 NOTE — Telephone Encounter (Signed)
Patient not able to come in on 12/13/2023 due to cost. Patient can come on 02/18/2024. Patient phone number is [phone].

## 2023-11-28 DIAGNOSIS — J449 Chronic obstructive pulmonary disease, unspecified: Secondary | ICD-10-CM | POA: Diagnosis not present

## 2023-11-28 DIAGNOSIS — E109 Type 1 diabetes mellitus without complications: Secondary | ICD-10-CM | POA: Diagnosis not present

## 2023-11-28 NOTE — Telephone Encounter (Signed)
Patient is already scheduled for February. Unable to come for HFU before then. Triage does not need to handle this. Nothing further needed.

## 2023-11-29 DIAGNOSIS — Z9181 History of falling: Secondary | ICD-10-CM | POA: Diagnosis not present

## 2023-11-29 DIAGNOSIS — E7800 Pure hypercholesterolemia, unspecified: Secondary | ICD-10-CM | POA: Diagnosis not present

## 2023-11-29 DIAGNOSIS — I480 Paroxysmal atrial fibrillation: Secondary | ICD-10-CM | POA: Diagnosis not present

## 2023-11-29 DIAGNOSIS — K5900 Constipation, unspecified: Secondary | ICD-10-CM | POA: Diagnosis not present

## 2023-11-29 DIAGNOSIS — I5032 Chronic diastolic (congestive) heart failure: Secondary | ICD-10-CM | POA: Diagnosis not present

## 2023-11-29 DIAGNOSIS — Z7901 Long term (current) use of anticoagulants: Secondary | ICD-10-CM | POA: Diagnosis not present

## 2023-11-29 DIAGNOSIS — Z794 Long term (current) use of insulin: Secondary | ICD-10-CM | POA: Diagnosis not present

## 2023-11-29 DIAGNOSIS — Z79891 Long term (current) use of opiate analgesic: Secondary | ICD-10-CM | POA: Diagnosis not present

## 2023-11-29 DIAGNOSIS — I272 Pulmonary hypertension, unspecified: Secondary | ICD-10-CM | POA: Diagnosis not present

## 2023-11-29 DIAGNOSIS — Z85850 Personal history of malignant neoplasm of thyroid: Secondary | ICD-10-CM | POA: Diagnosis not present

## 2023-11-29 DIAGNOSIS — E11319 Type 2 diabetes mellitus with unspecified diabetic retinopathy without macular edema: Secondary | ICD-10-CM | POA: Diagnosis not present

## 2023-11-29 DIAGNOSIS — E1142 Type 2 diabetes mellitus with diabetic polyneuropathy: Secondary | ICD-10-CM | POA: Diagnosis not present

## 2023-11-29 DIAGNOSIS — K219 Gastro-esophageal reflux disease without esophagitis: Secondary | ICD-10-CM | POA: Diagnosis not present

## 2023-11-29 DIAGNOSIS — G4733 Obstructive sleep apnea (adult) (pediatric): Secondary | ICD-10-CM | POA: Diagnosis not present

## 2023-11-29 DIAGNOSIS — Z9981 Dependence on supplemental oxygen: Secondary | ICD-10-CM | POA: Diagnosis not present

## 2023-11-29 DIAGNOSIS — I110 Hypertensive heart disease with heart failure: Secondary | ICD-10-CM | POA: Diagnosis not present

## 2023-11-29 DIAGNOSIS — I69354 Hemiplegia and hemiparesis following cerebral infarction affecting left non-dominant side: Secondary | ICD-10-CM | POA: Diagnosis not present

## 2023-11-29 DIAGNOSIS — Z87440 Personal history of urinary (tract) infections: Secondary | ICD-10-CM | POA: Diagnosis not present

## 2023-11-29 DIAGNOSIS — M1990 Unspecified osteoarthritis, unspecified site: Secondary | ICD-10-CM | POA: Diagnosis not present

## 2023-11-30 DIAGNOSIS — K219 Gastro-esophageal reflux disease without esophagitis: Secondary | ICD-10-CM | POA: Diagnosis not present

## 2023-11-30 DIAGNOSIS — I5032 Chronic diastolic (congestive) heart failure: Secondary | ICD-10-CM | POA: Diagnosis not present

## 2023-11-30 DIAGNOSIS — M1990 Unspecified osteoarthritis, unspecified site: Secondary | ICD-10-CM | POA: Diagnosis not present

## 2023-11-30 DIAGNOSIS — K5900 Constipation, unspecified: Secondary | ICD-10-CM | POA: Diagnosis not present

## 2023-11-30 DIAGNOSIS — I480 Paroxysmal atrial fibrillation: Secondary | ICD-10-CM | POA: Diagnosis not present

## 2023-11-30 DIAGNOSIS — E1142 Type 2 diabetes mellitus with diabetic polyneuropathy: Secondary | ICD-10-CM | POA: Diagnosis not present

## 2023-11-30 DIAGNOSIS — E7800 Pure hypercholesterolemia, unspecified: Secondary | ICD-10-CM | POA: Diagnosis not present

## 2023-11-30 DIAGNOSIS — E11319 Type 2 diabetes mellitus with unspecified diabetic retinopathy without macular edema: Secondary | ICD-10-CM | POA: Diagnosis not present

## 2023-11-30 DIAGNOSIS — L89622 Pressure ulcer of left heel, stage 2: Secondary | ICD-10-CM | POA: Diagnosis not present

## 2023-11-30 DIAGNOSIS — I110 Hypertensive heart disease with heart failure: Secondary | ICD-10-CM | POA: Diagnosis not present

## 2023-11-30 DIAGNOSIS — I272 Pulmonary hypertension, unspecified: Secondary | ICD-10-CM | POA: Diagnosis not present

## 2023-12-05 DIAGNOSIS — I480 Paroxysmal atrial fibrillation: Secondary | ICD-10-CM | POA: Diagnosis not present

## 2023-12-05 DIAGNOSIS — H8110 Benign paroxysmal vertigo, unspecified ear: Secondary | ICD-10-CM | POA: Diagnosis not present

## 2023-12-05 DIAGNOSIS — E890 Postprocedural hypothyroidism: Secondary | ICD-10-CM | POA: Diagnosis not present

## 2023-12-05 DIAGNOSIS — K219 Gastro-esophageal reflux disease without esophagitis: Secondary | ICD-10-CM | POA: Diagnosis not present

## 2023-12-05 DIAGNOSIS — D649 Anemia, unspecified: Secondary | ICD-10-CM | POA: Diagnosis not present

## 2023-12-05 DIAGNOSIS — I69354 Hemiplegia and hemiparesis following cerebral infarction affecting left non-dominant side: Secondary | ICD-10-CM | POA: Diagnosis not present

## 2023-12-05 DIAGNOSIS — I442 Atrioventricular block, complete: Secondary | ICD-10-CM | POA: Diagnosis not present

## 2023-12-05 DIAGNOSIS — I5032 Chronic diastolic (congestive) heart failure: Secondary | ICD-10-CM | POA: Diagnosis not present

## 2023-12-05 DIAGNOSIS — M1990 Unspecified osteoarthritis, unspecified site: Secondary | ICD-10-CM | POA: Diagnosis not present

## 2023-12-05 DIAGNOSIS — M545 Low back pain, unspecified: Secondary | ICD-10-CM | POA: Diagnosis not present

## 2023-12-05 DIAGNOSIS — E11649 Type 2 diabetes mellitus with hypoglycemia without coma: Secondary | ICD-10-CM | POA: Diagnosis not present

## 2023-12-05 DIAGNOSIS — I110 Hypertensive heart disease with heart failure: Secondary | ICD-10-CM | POA: Diagnosis not present

## 2023-12-05 DIAGNOSIS — G8929 Other chronic pain: Secondary | ICD-10-CM | POA: Diagnosis not present

## 2023-12-05 DIAGNOSIS — E7800 Pure hypercholesterolemia, unspecified: Secondary | ICD-10-CM | POA: Diagnosis not present

## 2023-12-05 DIAGNOSIS — E1142 Type 2 diabetes mellitus with diabetic polyneuropathy: Secondary | ICD-10-CM | POA: Diagnosis not present

## 2023-12-05 DIAGNOSIS — J9621 Acute and chronic respiratory failure with hypoxia: Secondary | ICD-10-CM | POA: Diagnosis not present

## 2023-12-05 DIAGNOSIS — L89622 Pressure ulcer of left heel, stage 2: Secondary | ICD-10-CM | POA: Diagnosis not present

## 2023-12-05 DIAGNOSIS — K5900 Constipation, unspecified: Secondary | ICD-10-CM | POA: Diagnosis not present

## 2023-12-05 DIAGNOSIS — I272 Pulmonary hypertension, unspecified: Secondary | ICD-10-CM | POA: Diagnosis not present

## 2023-12-05 DIAGNOSIS — G3184 Mild cognitive impairment, so stated: Secondary | ICD-10-CM | POA: Diagnosis not present

## 2023-12-05 DIAGNOSIS — K649 Unspecified hemorrhoids: Secondary | ICD-10-CM | POA: Diagnosis not present

## 2023-12-05 DIAGNOSIS — E11319 Type 2 diabetes mellitus with unspecified diabetic retinopathy without macular edema: Secondary | ICD-10-CM | POA: Diagnosis not present

## 2023-12-10 ENCOUNTER — Emergency Department (HOSPITAL_COMMUNITY): Payer: Medicare Other

## 2023-12-10 ENCOUNTER — Encounter (HOSPITAL_COMMUNITY): Payer: Self-pay | Admitting: Emergency Medicine

## 2023-12-10 ENCOUNTER — Inpatient Hospital Stay (HOSPITAL_COMMUNITY)
Admission: EM | Admit: 2023-12-10 | Discharge: 2023-12-12 | DRG: 637 | Disposition: A | Discharge status: Home Health | Payer: Medicare Other | Attending: Internal Medicine | Admitting: Internal Medicine

## 2023-12-10 ENCOUNTER — Other Ambulatory Visit: Payer: Self-pay

## 2023-12-10 DIAGNOSIS — E11649 Type 2 diabetes mellitus with hypoglycemia without coma: Secondary | ICD-10-CM | POA: Diagnosis not present

## 2023-12-10 DIAGNOSIS — Z823 Family history of stroke: Secondary | ICD-10-CM

## 2023-12-10 DIAGNOSIS — I480 Paroxysmal atrial fibrillation: Secondary | ICD-10-CM | POA: Diagnosis present

## 2023-12-10 DIAGNOSIS — E1059 Type 1 diabetes mellitus with other circulatory complications: Principal | ICD-10-CM | POA: Diagnosis present

## 2023-12-10 DIAGNOSIS — I10 Essential (primary) hypertension: Secondary | ICD-10-CM | POA: Diagnosis not present

## 2023-12-10 DIAGNOSIS — Z91040 Latex allergy status: Secondary | ICD-10-CM

## 2023-12-10 DIAGNOSIS — I69354 Hemiplegia and hemiparesis following cerebral infarction affecting left non-dominant side: Secondary | ICD-10-CM | POA: Diagnosis not present

## 2023-12-10 DIAGNOSIS — E890 Postprocedural hypothyroidism: Secondary | ICD-10-CM | POA: Diagnosis not present

## 2023-12-10 DIAGNOSIS — Z85850 Personal history of malignant neoplasm of thyroid: Secondary | ICD-10-CM

## 2023-12-10 DIAGNOSIS — E1042 Type 1 diabetes mellitus with diabetic polyneuropathy: Secondary | ICD-10-CM | POA: Diagnosis not present

## 2023-12-10 DIAGNOSIS — Z833 Family history of diabetes mellitus: Secondary | ICD-10-CM

## 2023-12-10 DIAGNOSIS — F419 Anxiety disorder, unspecified: Secondary | ICD-10-CM | POA: Diagnosis present

## 2023-12-10 DIAGNOSIS — I959 Hypotension, unspecified: Secondary | ICD-10-CM | POA: Diagnosis present

## 2023-12-10 DIAGNOSIS — E162 Hypoglycemia, unspecified: Principal | ICD-10-CM | POA: Diagnosis present

## 2023-12-10 DIAGNOSIS — Z794 Long term (current) use of insulin: Secondary | ICD-10-CM

## 2023-12-10 DIAGNOSIS — I672 Cerebral atherosclerosis: Secondary | ICD-10-CM | POA: Diagnosis not present

## 2023-12-10 DIAGNOSIS — R9431 Abnormal electrocardiogram [ECG] [EKG]: Secondary | ICD-10-CM | POA: Diagnosis not present

## 2023-12-10 DIAGNOSIS — I442 Atrioventricular block, complete: Secondary | ICD-10-CM | POA: Diagnosis not present

## 2023-12-10 DIAGNOSIS — Z9181 History of falling: Secondary | ICD-10-CM

## 2023-12-10 DIAGNOSIS — Z7901 Long term (current) use of anticoagulants: Secondary | ICD-10-CM

## 2023-12-10 DIAGNOSIS — J9611 Chronic respiratory failure with hypoxia: Secondary | ICD-10-CM

## 2023-12-10 DIAGNOSIS — Z950 Presence of cardiac pacemaker: Secondary | ICD-10-CM | POA: Diagnosis present

## 2023-12-10 DIAGNOSIS — R404 Transient alteration of awareness: Secondary | ICD-10-CM | POA: Diagnosis not present

## 2023-12-10 DIAGNOSIS — T68XXXA Hypothermia, initial encounter: Secondary | ICD-10-CM | POA: Diagnosis not present

## 2023-12-10 DIAGNOSIS — Z993 Dependence on wheelchair: Secondary | ICD-10-CM | POA: Diagnosis not present

## 2023-12-10 DIAGNOSIS — J690 Pneumonitis due to inhalation of food and vomit: Secondary | ICD-10-CM | POA: Diagnosis not present

## 2023-12-10 DIAGNOSIS — E785 Hyperlipidemia, unspecified: Secondary | ICD-10-CM | POA: Diagnosis present

## 2023-12-10 DIAGNOSIS — E662 Morbid (severe) obesity with alveolar hypoventilation: Secondary | ICD-10-CM | POA: Diagnosis present

## 2023-12-10 DIAGNOSIS — I110 Hypertensive heart disease with heart failure: Secondary | ICD-10-CM | POA: Diagnosis present

## 2023-12-10 DIAGNOSIS — Z9981 Dependence on supplemental oxygen: Secondary | ICD-10-CM | POA: Diagnosis not present

## 2023-12-10 DIAGNOSIS — Z6841 Body Mass Index (BMI) 40.0 and over, adult: Secondary | ICD-10-CM

## 2023-12-10 DIAGNOSIS — Z90710 Acquired absence of both cervix and uterus: Secondary | ICD-10-CM

## 2023-12-10 DIAGNOSIS — I5032 Chronic diastolic (congestive) heart failure: Secondary | ICD-10-CM | POA: Diagnosis not present

## 2023-12-10 DIAGNOSIS — I69322 Dysarthria following cerebral infarction: Secondary | ICD-10-CM

## 2023-12-10 DIAGNOSIS — I517 Cardiomegaly: Secondary | ICD-10-CM | POA: Diagnosis not present

## 2023-12-10 DIAGNOSIS — G238 Other specified degenerative diseases of basal ganglia: Secondary | ICD-10-CM | POA: Diagnosis not present

## 2023-12-10 DIAGNOSIS — R6889 Other general symptoms and signs: Secondary | ICD-10-CM | POA: Diagnosis not present

## 2023-12-10 DIAGNOSIS — Z841 Family history of disorders of kidney and ureter: Secondary | ICD-10-CM

## 2023-12-10 DIAGNOSIS — Z8249 Family history of ischemic heart disease and other diseases of the circulatory system: Secondary | ICD-10-CM

## 2023-12-10 DIAGNOSIS — Z743 Need for continuous supervision: Secondary | ICD-10-CM | POA: Diagnosis not present

## 2023-12-10 DIAGNOSIS — Z83438 Family history of other disorder of lipoprotein metabolism and other lipidemia: Secondary | ICD-10-CM

## 2023-12-10 DIAGNOSIS — D696 Thrombocytopenia, unspecified: Secondary | ICD-10-CM | POA: Insufficient documentation

## 2023-12-10 DIAGNOSIS — E66813 Obesity, class 3: Secondary | ICD-10-CM | POA: Diagnosis present

## 2023-12-10 DIAGNOSIS — E10649 Type 1 diabetes mellitus with hypoglycemia without coma: Principal | ICD-10-CM | POA: Diagnosis present

## 2023-12-10 DIAGNOSIS — I700 Atherosclerosis of aorta: Secondary | ICD-10-CM | POA: Diagnosis not present

## 2023-12-10 DIAGNOSIS — K219 Gastro-esophageal reflux disease without esophagitis: Secondary | ICD-10-CM | POA: Diagnosis present

## 2023-12-10 DIAGNOSIS — Z79890 Hormone replacement therapy (postmenopausal): Secondary | ICD-10-CM

## 2023-12-10 DIAGNOSIS — R4020 Unspecified coma: Secondary | ICD-10-CM | POA: Diagnosis not present

## 2023-12-10 DIAGNOSIS — F32A Depression, unspecified: Secondary | ICD-10-CM | POA: Diagnosis present

## 2023-12-10 DIAGNOSIS — X58XXXA Exposure to other specified factors, initial encounter: Secondary | ICD-10-CM | POA: Diagnosis present

## 2023-12-10 DIAGNOSIS — R0989 Other specified symptoms and signs involving the circulatory and respiratory systems: Secondary | ICD-10-CM | POA: Diagnosis not present

## 2023-12-10 DIAGNOSIS — E161 Other hypoglycemia: Secondary | ICD-10-CM | POA: Diagnosis not present

## 2023-12-10 DIAGNOSIS — Z79899 Other long term (current) drug therapy: Secondary | ICD-10-CM

## 2023-12-10 DIAGNOSIS — R0902 Hypoxemia: Secondary | ICD-10-CM | POA: Diagnosis not present

## 2023-12-10 DIAGNOSIS — R918 Other nonspecific abnormal finding of lung field: Secondary | ICD-10-CM | POA: Diagnosis not present

## 2023-12-10 LAB — BASIC METABOLIC PANEL
Anion gap: 8 (ref 5–15)
BUN: 14 mg/dL (ref 8–23)
CO2: 27 mmol/L (ref 22–32)
Calcium: 8.7 mg/dL — ABNORMAL LOW (ref 8.9–10.3)
Chloride: 104 mmol/L (ref 98–111)
Creatinine, Ser: 0.88 mg/dL (ref 0.44–1.00)
GFR, Estimated: >60 mL/min (ref >60)
Glucose, Bld: 111 mg/dL — ABNORMAL HIGH (ref 70–99)
Potassium: 3.2 mmol/L — ABNORMAL LOW (ref 3.5–5.1)
Sodium: 139 mmol/L (ref 135–145)

## 2023-12-10 LAB — CBC WITH DIFFERENTIAL/PLATELET
Abs Immature Granulocytes: 0.02 10*3/uL (ref 0.00–0.07)
Basophils Absolute: 0 10*3/uL (ref 0.0–0.1)
Basophils Relative: 0 %
Eosinophils Absolute: 0 10*3/uL (ref 0.0–0.5)
Eosinophils Relative: 0 %
HCT: 33.7 % — ABNORMAL LOW (ref 36.0–46.0)
Hemoglobin: 10 g/dL — ABNORMAL LOW (ref 12.0–15.0)
Immature Granulocytes: 0 %
Lymphocytes Relative: 10 %
Lymphs Abs: 0.5 10*3/uL — ABNORMAL LOW (ref 0.7–4.0)
MCH: 29 pg (ref 26.0–34.0)
MCHC: 29.7 g/dL — ABNORMAL LOW (ref 30.0–36.0)
MCV: 97.7 fL (ref 80.0–100.0)
Monocytes Absolute: 0.3 10*3/uL (ref 0.1–1.0)
Monocytes Relative: 6 %
Neutro Abs: 4.7 10*3/uL (ref 1.7–7.7)
Neutrophils Relative %: 84 %
Platelets: 110 10*3/uL — ABNORMAL LOW (ref 150–400)
RBC: 3.45 MIL/uL — ABNORMAL LOW (ref 3.87–5.11)
RDW: 13.2 % (ref 11.5–15.5)
WBC: 5.6 10*3/uL (ref 4.0–10.5)
nRBC: 0 % (ref 0.0–0.2)

## 2023-12-10 LAB — LACTIC ACID, PLASMA: Lactic Acid, Venous: 1.9 mmol/L (ref 0.5–1.9)

## 2023-12-10 LAB — CBG MONITORING, ED
Glucose-Capillary: 111 mg/dL — ABNORMAL HIGH (ref 70–99)
Glucose-Capillary: 83 mg/dL (ref 70–99)
Glucose-Capillary: 141 mg/dL — ABNORMAL HIGH (ref 70–99)
Glucose-Capillary: 238 mg/dL — ABNORMAL HIGH (ref 70–99)

## 2023-12-10 LAB — T4, FREE: Free T4: 2.31 ng/dL — ABNORMAL HIGH (ref 0.61–1.12)

## 2023-12-10 LAB — GLUCOSE, CAPILLARY: Glucose-Capillary: 233 mg/dL — ABNORMAL HIGH (ref 70–99)

## 2023-12-10 LAB — BRAIN NATRIURETIC PEPTIDE: B Natriuretic Peptide: 146.6 pg/mL — ABNORMAL HIGH (ref 0.0–100.0)

## 2023-12-10 LAB — TSH: TSH: 0.139 u[IU]/mL — ABNORMAL LOW (ref 0.350–4.500)

## 2023-12-10 LAB — I-STAT CG4 LACTIC ACID, ED: Lactic Acid, Venous: 1.2 mmol/L (ref 0.5–1.9)

## 2023-12-10 MED ORDER — BUSPIRONE HCL 5 MG PO TABS: 5.0000 mg | ORAL_TABLET | Freq: Three times a day (TID) | ORAL | Status: DC | PRN

## 2023-12-10 MED ORDER — LEVOTHYROXINE SODIUM 75 MCG PO TABS
175.0000 ug | ORAL_TABLET | Freq: Every day | ORAL | Status: DC
  Administered 2023-12-11 – 2023-12-12 (×2): 175 ug via ORAL
  Filled 2023-12-10 (×2): qty 1

## 2023-12-10 MED ORDER — ALBUTEROL SULFATE (2.5 MG/3ML) 0.083% IN NEBU
2.5000 mg | INHALATION_SOLUTION | Freq: Four times a day (QID) | RESPIRATORY_TRACT | Status: DC | PRN
Start: 2023-12-10 — End: 2023-12-12

## 2023-12-10 MED ORDER — SODIUM CHLORIDE 0.9% FLUSH
3.0000 mL | Freq: Two times a day (BID) | INTRAVENOUS | Status: DC
  Administered 2023-12-11: 3 mL via INTRAVENOUS

## 2023-12-10 MED ORDER — DOXYCYCLINE HYCLATE 100 MG PO TABS
100.0000 mg | ORAL_TABLET | Freq: Once | ORAL | Status: AC
  Administered 2023-12-10: 100 mg via ORAL
  Filled 2023-12-10: qty 1

## 2023-12-10 MED ORDER — DEXTROSE 10 % IV SOLN
100.0000 mL | INTRAVENOUS | Status: DC
  Administered 2023-12-10: 100 mL via INTRAVENOUS

## 2023-12-10 MED ORDER — ATORVASTATIN CALCIUM 80 MG PO TABS
80.0000 mg | ORAL_TABLET | Freq: Every evening | ORAL | Status: DC
  Administered 2023-12-11 (×2): 80 mg via ORAL
  Filled 2023-12-10 (×2): qty 1

## 2023-12-10 MED ORDER — ACETAMINOPHEN 650 MG RE SUPP: 650.0000 mg | Freq: Four times a day (QID) | RECTAL | Status: DC | PRN

## 2023-12-10 MED ORDER — ACETAMINOPHEN 325 MG PO TABS
650.0000 mg | ORAL_TABLET | Freq: Four times a day (QID) | ORAL | Status: DC | PRN
Start: 2023-12-10 — End: 2023-12-12
  Administered 2023-12-11 – 2023-12-12 (×3): 650 mg via ORAL
  Filled 2023-12-10 (×3): qty 2

## 2023-12-10 MED ORDER — SODIUM CHLORIDE 0.9 % IV SOLN
1.0000 g | Freq: Once | INTRAVENOUS | Status: AC
  Administered 2023-12-10: 1 g via INTRAVENOUS
  Filled 2023-12-10: qty 10

## 2023-12-10 MED ORDER — SODIUM CHLORIDE 0.9 % IV BOLUS
1000.0000 mL | Freq: Once | INTRAVENOUS | Status: AC
  Administered 2023-12-10: 1000 mL via INTRAVENOUS

## 2023-12-10 MED ORDER — SODIUM CHLORIDE 0.9% FLUSH: 3.0000 mL | INTRAVENOUS | Status: DC | PRN

## 2023-12-10 MED ORDER — APIXABAN 5 MG PO TABS
5.0000 mg | ORAL_TABLET | Freq: Two times a day (BID) | ORAL | Status: DC
  Administered 2023-12-11 – 2023-12-12 (×4): 5 mg via ORAL
  Filled 2023-12-10 (×4): qty 1

## 2023-12-10 MED ORDER — SODIUM CHLORIDE 0.9 % IV SOLN: 250.0000 mL | INTRAVENOUS | Status: DC | PRN

## 2023-12-10 MED ORDER — GABAPENTIN 300 MG PO CAPS
300.0000 mg | ORAL_CAPSULE | Freq: Three times a day (TID) | ORAL | Status: DC
  Administered 2023-12-11 – 2023-12-12 (×5): 300 mg via ORAL
  Filled 2023-12-10 (×5): qty 1

## 2023-12-10 MED ORDER — PANTOPRAZOLE SODIUM 40 MG PO TBEC
40.0000 mg | DELAYED_RELEASE_TABLET | Freq: Two times a day (BID) | ORAL | Status: DC
  Administered 2023-12-11 – 2023-12-12 (×4): 40 mg via ORAL
  Filled 2023-12-10 (×4): qty 1

## 2023-12-10 MED ORDER — INSULIN ASPART 100 UNIT/ML IJ SOLN
0.0000 [IU] | Freq: Three times a day (TID) | INTRAMUSCULAR | Status: DC
  Administered 2023-12-11: 2 [IU] via SUBCUTANEOUS
  Administered 2023-12-11 (×2): 3 [IU] via SUBCUTANEOUS
  Administered 2023-12-12: 7 [IU] via SUBCUTANEOUS

## 2023-12-10 MED ORDER — INSULIN ASPART 100 UNIT/ML IJ SOLN
0.0000 [IU] | Freq: Every day | INTRAMUSCULAR | Status: DC
Start: 2023-12-10 — End: 2023-12-12
  Administered 2023-12-11: 3 [IU] via SUBCUTANEOUS

## 2023-12-10 MED ORDER — HYDRALAZINE HCL 20 MG/ML IJ SOLN: 10.0000 mg | Freq: Four times a day (QID) | INTRAMUSCULAR | Status: DC | PRN

## 2023-12-10 MED ORDER — BISACODYL 5 MG PO TBEC: 5.0000 mg | DELAYED_RELEASE_TABLET | Freq: Every day | ORAL | Status: DC | PRN

## 2023-12-10 MED ORDER — POLYETHYLENE GLYCOL 3350 17 G PO PACK: 17.0000 g | PACK | Freq: Every day | ORAL | Status: DC | PRN

## 2023-12-10 MED ORDER — ENOXAPARIN SODIUM 40 MG/0.4ML IJ SOSY: 40.0000 mg | PREFILLED_SYRINGE | INTRAMUSCULAR | Status: DC

## 2023-12-10 MED ORDER — SODIUM CHLORIDE 0.9 % IV SOLN
3.0000 g | Freq: Four times a day (QID) | INTRAVENOUS | Status: AC
  Administered 2023-12-10 – 2023-12-11 (×5): 3 g via INTRAVENOUS
  Filled 2023-12-10 (×5): qty 8

## 2023-12-10 NOTE — ED Triage Notes (Signed)
PT unresponsive in powered wheelchair at home.  Fire assessed pinpoint pupils on scene and gave Narcan with no response. Glucose found to be 38 on scene.  Difficulty with IV.  Glucagon given, IV established shortly before arrival and D10 started.    BP 100/60, 100% RA

## 2023-12-10 NOTE — ED Notes (Signed)
Patient transported to CT 

## 2023-12-10 NOTE — ED Notes (Signed)
2nd set of blood cultures obtained at 2003. Not obtained by previous shift RN

## 2023-12-10 NOTE — H&P (Signed)
[facility] History and Physical  Javonna Grapes UVO:[id number] DOB: Sep 08, 1951 DOA: 12/10/2023 PCP: Irven Coe, MD  Presented from: Home Chief Complaint: Hypoglycemia  History of Present Illness: [name] is a 72 y.o. female with PMH significant for DM2, HTN, HLD, CVA with left hemiparesis, wheelchair-bound status, paroxysmal A-fib on Eliquis, CHB s/p PPM, CHF on 2 L oxygen nasal cannula , hypothyroidism, thyroid cancer, Patient was brought to the ED today by EMS with concern of hypoglycemia. In the morning today, family found patient unresponsive, sitting on a wheelchair near the door.  EMS was called.  Noted blood sugar level low in 30s.  She was given D10 and glucagon with gradual improvement in mental status.  Brought to the ED. Of note, she was recently hospitalized 3 weeks ago for hypoglycemia and possible pneumonia  In the ED, she was hypothermic to 94.7, Bair hugger was started.  Heart rate in 50s.  Blood pressure was initially in 80s and later improved O2 sat was low in 80s and later improved on oxygen by nasal cannula. Patient's mental status gradually improved in the ED.  She reported she took her insulin in the morning but could not recall if she ate any meal. Labs showed WC count normal at 5.6, hemoglobin 10, platelet level low at 110, potassium low at 3.2 TSH low at 0.131, free T4 elevated to 2.31 Chest x-ray showed right basilar opacity may be secondary to aspiration, pneumonia, or asymmetric pulmonary edema. EKG shows normal sinus rhythm at 65 bpm, QTc 535 ms Given hypothermia, hypotension, hypoxia and infiltrates in chest x-ray, patient was suspected to have aspiration pneumonia while unresponsive.  She was started on broad-spectrum IV antibiotics [facility] was consulted for in-house management.  At the time of my evaluation, patient was propped up in bed.  Elderly African-American female.  No family at bedside. Slow to respond but alert,  awake, oriented to place and person. She had a [employer] on. She was only able to give me some details of her history. Chart reviewed.  History detailed as above. Most recent blood sugar level 238 at 6:15 PM  I called patient's sister Ms. Britta Mccreedy, emergency contact listed, left a voicemail.    Review of Systems:  All systems were reviewed and were negative unless otherwise mentioned in the HPI   Past medical history: Past Medical History:  Diagnosis Date   Abnormal echocardiogram    Anemia    Arthritis    BPPV (benign paroxysmal positional vertigo), right 06/13/2017   Bradycardia 03/07/2020   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   CHB (complete heart block) (HCC) 03/08/2020   CHF (congestive heart failure) (HCC)    Closed fracture of left proximal humerus 07/08/2019   Decreased range of motion of left shoulder 09/03/2020   Depression    Dizzy 03/07/2020   bradycardia   Edentulous 09/03/2020   GERD (gastroesophageal reflux disease)    Hemiparesis affecting left side as late effect of cerebrovascular accident (CVA) (HCC) 03/08/2020   Hemorrhoids    History of Falls with injury 03/08/2020   Fall resulting in left humeral fracture   Hyperlipidemia    Hypertension    Hypothyroidism    Intracranial vascular stenosis    Lacunar infarction (HCC)    Brain MRI 07/2021 multiple small remote pontine lacunar infarcts   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   Left pontine stroke (HCC) 08/06/2021   Brain MRI 08/05/21: Small acute infarct in  the left pons.  Severe chronic microvascular ischemic disease of the pons, left middle cerebellar peduncle, and mini remote lacunar infarcts, progressive since 2019.  Remote pontine microhemorrhage most likely of hypertensive etiology.  10/10/21 CT reveals chronic atrophic changes.     Malignant tumor of thyroid gland (HCC) 03/27/2022   Near syncope 03/08/2020   Paroxysmal atrial  fibrillation (HCC)    Presence of permanent cardiac pacemaker    Proteinuria 03/08/2020   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 06/25/20 06/26/2020   Seasonal allergies    Stroke (HCC)    Suicidal ideation 11/16/2021   Thyroid cancer (HCC)    per pt S/p Total Thyroidectomy with Radioactive Iodine Therapy   Type 1 diabetes mellitus (HCC)     Past surgical history: Past Surgical History:  Procedure Laterality Date   ABDOMINAL ANGIOGRAM N/A 05/14/2012   Procedure: ABDOMINAL ANGIOGRAM;  Surgeon: [name], MD;  Location: [facility] CATH LAB;  Service: Cardiovascular;  Laterality: N/A;   ABDOMINAL HYSTERECTOMY  1995   partial   ANKLE FRACTURE SURGERY Right    CARDIAC CATHETERIZATION     with coronary angiogram   cataract  Bilateral 2018   COLONOSCOPY N/A 09/09/2014   Procedure: COLONOSCOPY;  Surgeon: Iva Boop, MD;  Location: [facility] ENDOSCOPY;  Service: Endoscopy;  Laterality: N/A;   ESOPHAGOGASTRODUODENOSCOPY (EGD) WITH PROPOFOL N/A 12/03/2018   Procedure: ESOPHAGOGASTRODUODENOSCOPY (EGD) WITH PROPOFOL;  Surgeon: Sherrilyn Rist, MD;  Location: [facility] ENDOSCOPY;  Service: Gastroenterology;  Laterality: N/A;   LEFT AND RIGHT HEART CATHETERIZATION WITH CORONARY ANGIOGRAM N/A 05/14/2012   Procedure: LEFT AND RIGHT HEART CATHETERIZATION WITH CORONARY ANGIOGRAM;  Surgeon: [name], MD;  Location: [facility] CATH LAB;  Service: Cardiovascular;  Laterality: N/A;   LOOP RECORDER INSERTION N/A 12/23/2018   Procedure: LOOP RECORDER INSERTION;  Surgeon: Regan Lemming, MD;  Location: [facility] INVASIVE CV LAB;  Service: Cardiovascular;  Laterality: N/A;   LOOP RECORDER REMOVAL N/A 06/25/2020   Procedure: LOOP RECORDER REMOVAL;  Surgeon: Regan Lemming, MD;  Location: [facility] INVASIVE CV LAB;  Service: Cardiovascular;  Laterality: N/A;   MINI METER   09/08/2014   ELECTRONIC INSULIN PUMP   PACEMAKER IMPLANT N/A 06/25/2020   Procedure: PACEMAKER IMPLANT;  Surgeon: Regan Lemming, MD;  Location: [facility] INVASIVE CV LAB;  Service: Cardiovascular;  Laterality: N/A;   TEE WITHOUT CARDIOVERSION N/A 10/16/2018   Procedure: TRANSESOPHAGEAL ECHOCARDIOGRAM (TEE);  Surgeon: Jodelle Red, MD;  Location: [facility] ENDOSCOPY;  Service: Cardiovascular;  Laterality: N/A;   THYROIDECTOMY  2006   for thyroid cancer    Social History:  reports that she has never smoked. She has never used smokeless tobacco. She reports that she does not drink alcohol and does not use drugs.  Allergies:  Allergies  Allergen Reactions   Latex Rash   Latex   Family history:  Family History  Problem Relation Age of Onset   Heart disease Mother    Hyperlipidemia Mother    Hypertension Mother    Renal Disease Mother        insufficiency   Heart attack Mother        in his 23's   Early death Father        Head Injury at Work   Hyperlipidemia Father    Hypertension Father    Stroke Son    Diabetes Maternal Aunt    Prostate cancer Paternal Uncle    Heart disease Maternal Aunt    Heart failure  Maternal Grandfather    Heart disease Maternal Grandfather    Heart attack Maternal Grandfather        Died in his 65's   Breast cancer Neg Hx      Physical Exam: Vitals:   12/10/23 1715 12/10/23 1730 12/10/23 1745 12/10/23 1800  BP: (!) 115/47 (!) 122/45 (!) 123/47 (!) 117/48  Pulse: (!) 54 (!) 52 (!) 55 (!) 53  Resp: 11 (!) 9 (!) 9 (!) 8  Temp:      TempSrc:      SpO2: 100% 100% 100% 100%  Weight:      Height:       Wt Readings from Last 3 Encounters:  12/10/23 116.1 kg  11/20/23 116.1 kg  09/11/23 108.9 kg   Body mass index is 48.36 kg/m.  General exam: Pleasant, elderly African-American female.  Not in physical distress.  On Bair hugger Skin: No rashes, lesions or ulcers. HEENT: Atraumatic, normocephalic, no obvious bleeding Lungs: Clear to auscultation bilaterally but diminished air entry in both bases CVS: Regular rate and rhythm, no murmur GI/Abd soft,  nontender, nondistended, bowel sound present CNS: Alert, awake, slow to respond but oriented to place and person.  Baseline deficits from previous stroke present Psychiatry: Mood appropriate Extremities: No pedal edema,, no calf tenderness.  Pressure ulcer prevention boots on right leg   ----------------------------------------------------------------------------------------------------------------------------------------- ----------------------------------------------------------------------------------------------------------------------------------------- -----------------------------------------------------------------------------------------------------------------------------------------  Assessment/Plan: Principal Problem:   Hypoglycemia  Hypoglycemic episode Type 2 diabetes mellitus A1c 7 on 11/18/2023 Brought in for unresponsiveness likely due to hypoglycemia.  Initial blood sugar level was low in 30s.  Improved with glucagon and D50 by EMS. Glucose trend in the ER noted as below.  Rising up this evening.  Patient is alert, awake and able to eat. Will start on sliding scale insulin. PTA meds-Tresiba 22 units daily, Humalog Premeal 3 to 5 units 3 times daily, I would hold long-acting insulin at this time.  Probably resume tomorrow. Patient follows up with Dr. Talmage Nap endocrinology.  Wears a Dexcom for glucose monitoring Seen by diabetes care coordinator this afternoon Recent Labs  Lab 12/10/23 1244 12/10/23 1305 12/10/23 1354 12/10/23 1815  GLUCAP 83 111* 141* 238*   Aspiration pneumonia Given hypothermia, hypotension, hypoxia and infiltrates in chest x-ray, patient was suspected to have aspiration pneumonia while unresponsive.  She was started on broad-spectrum IV antibiotics I will start on a course of IV Unasyn Recent Labs  Lab 12/10/23 1307 12/10/23 1734  WBC 5.6  --   LATICACIDVEN  --  1.9   Hypothermia Currently has Lawyer on.  Chronic hypoxic respiratory  failure ??  COPD On 2 L oxygen at baseline.  Unclear if she was on supplemental oxygen or room air when she was noted to be hypoxic earlier. Continue bronchodilators Continue to monitor  Chronic diastolic CHF Essential hypertension PTA meds- Lasix 40 mg twice daily, losartan 25 mg daily, amlodipine 2.5 mg daily Blood pressure was initially low, gradually improving but MAP still remains low.  Hold BP meds for now  Paroxysmal A-fib A-fib CHB s/p PPM Seems chronically anticoagulated on Eliquis 5 0 twice daily  Prolonged Qtc Initial EKG with QTc prolonged to 535 ms.  Has wide QRS complex likely due to PPM Avoid QTc prolonging meds  H/o stroke with residual left hemiparesis Wheelchair-bound status HLD PTA meds- Eliquis, Lipitor 80 mg daily Continue both  H/o thyroid cancer Hypothyroidism Continue Synthroid  Anxiety/depression BuSpar PRN??  Peripheral neuropathy Continue Neurontin 300 mg 3 times daily,  GERD Continue PPI  twice daily  Mobility: Wheelchair-bound status  Goals of care:   Code Status: Full Code    DVT prophylaxis:   apixaban (ELIQUIS) tablet 5 mg   Antimicrobials: Start IV Unasyn Fluid: None Consultants: None Family Communication: Called and left a message to patient's sister  Dispo: The patient is from: Home              Anticipated d/c is to: If glucose level stabilizes and respiratory status remains stable, potential discharge in 1 to 2 days  Diet: Diet Order             Diet heart healthy/carb modified Room service appropriate? Yes; Fluid consistency: Thin  Diet effective now                    ------------------------------------------------------------------------------------- Severity of Illness: The appropriate patient status for this patient is OBSERVATION. Observation status is judged to be reasonable and necessary in order to provide the required intensity of service to ensure the patient's safety. The patient's presenting  symptoms, physical exam findings, and initial radiographic and laboratory data in the context of their medical condition is felt to place them at decreased risk for further clinical deterioration. Furthermore, it is anticipated that the patient will be medically stable for discharge from the hospital within 2 midnights of admission.  -------------------------------------------------------------------------------------  Home Meds: Prior to Admission medications   Medication Sig Start Date End Date Taking? Authorizing Provider  acetaminophen (TYLENOL) 650 MG CR tablet Take 650 mg by mouth daily as needed (severe pain).    [provider]  albuterol (VENTOLIN HFA) 108 (90 Base) MCG/ACT inhaler Inhale 2 puffs into the lungs every 6 (six) hours as needed for wheezing or shortness of breath.    [provider]  amLODipine (NORVASC) 2.5 MG tablet Take 2.5 mg by mouth daily.    [provider]  apixaban (ELIQUIS) 5 MG TABS tablet Take 1 tablet (5 mg total) by mouth 2 (two) times daily. 10/15/23   Yates Decamp, MD  atorvastatin (LIPITOR) 80 MG tablet TAKE 1 TABLET(80 MG) BY MOUTH EVERY EVENING Patient taking differently: Take 80 mg by mouth every evening. 10/23/22   Alicia Amel, MD  b complex vitamins capsule Take 1 capsule by mouth daily.    [provider]  busPIRone (BUSPAR) 5 MG tablet TAKE 1 TABLET(5 MG) BY MOUTH THREE TIMES DAILY Patient taking differently: Take 5 mg by mouth 3 (three) times daily as needed (anxiety). 02/02/23   Alicia Amel, MD  Cholecalciferol (VITAMIN D-3 PO) Take 1 capsule by mouth daily.    [provider]  fluticasone (FLONASE) 50 MCG/ACT nasal spray Place 1 spray into both nostrils daily. 11/27/23   [provider]  furosemide (LASIX) 40 MG tablet Take 1 tablet (40 mg total) by mouth 2 (two) times daily. 02/06/23   Custovic, Rozell Searing, DO  gabapentin (NEURONTIN) 300 MG capsule Take 1 capsule (300 mg total) by mouth as  needed. Patient taking differently: Take 300 mg by mouth 3 (three) times daily. 01/09/23   Alicia Amel, MD  HUMALOG 100 UNIT/ML injection Inject 3-5 Units into the skin 3 (three) times daily with meals as needed for high blood sugar. 07/23/23   [provider]  levothyroxine (SYNTHROID) 175 MCG tablet TAKE 1 TABLET(175 MCG) BY MOUTH DAILY BEFORE BREAKFAST Patient taking differently: Take 175 mcg by mouth daily before breakfast. 12/13/22   Alicia Amel, MD  loratadine (CLARITIN) 10 MG tablet Take 10 mg  by mouth daily.    [provider]  losartan (COZAAR) 25 MG tablet Take 1 tablet (25 mg total) by mouth daily. 10/30/23 01/28/24  Yates Decamp, MD  MAGNESIUM-POTASSIUM PO Take 1 each by mouth every evening. Potassium-magnesium combination gummy.    [provider]  OXYGEN Inhale 2 L into the lungs continuous.    [provider]  pantoprazole (PROTONIX) 40 MG tablet Take 1 tablet (40 mg total) by mouth 2 (two) times daily. 10/27/21   Medina-Vargas, Monina C, NP  polyethylene glycol powder (GLYCOLAX/MIRALAX) 17 GM/SCOOP powder Take 17 g by mouth 2 (two) times daily as needed for moderate constipation. Patient taking differently: Take 17 g by mouth daily. 06/21/22   [name], Makiera, MD  TRESIBA FLEXTOUCH 100 UNIT/ML FlexTouch Pen Inject 22 Units into the skin daily. 09/04/23   [provider]  Monte Fantasia INHUB 500-50 MCG/ACT AEPB Inhale 1 puff into the lungs in the morning and at bedtime. 08/31/23   [provider]    Labs on Admission:   CBC: Recent Labs  Lab 12/10/23 1307  WBC 5.6  NEUTROABS 4.7  HGB 10.0*  HCT 33.7*  MCV 97.7  PLT 110*    Basic Metabolic Panel: Recent Labs  Lab 12/10/23 1307  NA 139  K 3.2*  CL 104  CO2 27  GLUCOSE 111*  BUN 14  CREATININE 0.88  CALCIUM 8.7*    Liver Function Tests: No results for input(s): "AST", "ALT", "ALKPHOS", "BILITOT", "PROT", "ALBUMIN" in the last 168 hours. No results for  input(s): "LIPASE", "AMYLASE" in the last 168 hours. No results for input(s): "AMMONIA" in the last 168 hours.  Cardiac Enzymes: No results for input(s): "CKTOTAL", "CKMB", "CKMBINDEX", "TROPONINI" in the last 168 hours.  BNP (last 3 results) Recent Labs    11/17/23 1103  BNP 212.5*    ProBNP (last 3 results) No results for input(s): "PROBNP" in the last 8760 hours.  CBG: Recent Labs  Lab 12/10/23 1244 12/10/23 1305 12/10/23 1354 12/10/23 1815  GLUCAP 83 111* 141* 238*    Lipase     Component Value Date/Time   LIPASE 16 04/15/2019 0958     Urinalysis    Component Value Date/Time   COLORURINE YELLOW 11/17/2023 1210   APPEARANCEUR CLEAR 11/17/2023 1210   LABSPEC 1.016 11/17/2023 1210   PHURINE 5.0 11/17/2023 1210   GLUCOSEU NEGATIVE 11/17/2023 1210   HGBUR NEGATIVE 11/17/2023 1210   BILIRUBINUR NEGATIVE 11/17/2023 1210   BILIRUBINUR negative 10/29/2018 1430   BILIRUBINUR Negative 11/24/2016 0922   KETONESUR NEGATIVE 11/17/2023 1210   PROTEINUR NEGATIVE 11/17/2023 1210   UROBILINOGEN 0.2 10/29/2018 1430   UROBILINOGEN 0.2 08/29/2015 0947   NITRITE NEGATIVE 11/17/2023 1210   LEUKOCYTESUR NEGATIVE 11/17/2023 1210     Drugs of Abuse     Component Value Date/Time   LABOPIA NONE DETECTED 03/07/2020 1704   COCAINSCRNUR NONE DETECTED 03/07/2020 1704   LABBENZ NONE DETECTED 03/07/2020 1704   AMPHETMU NONE DETECTED 03/07/2020 1704   THCU NONE DETECTED 03/07/2020 1704   LABBARB NONE DETECTED 03/07/2020 1704      Radiological Exams on Admission: DG Chest Port 1 View Result Date: 12/10/2023 CLINICAL DATA:  aspiration evaluation. EXAM: PORTABLE CHEST 1 VIEW COMPARISON:  Chest radiograph dated November 18, 2023. FINDINGS: The heart size is enlarged. Stable left-sided dual lead pacemaker. Loop recorder device overlies the left lower chest wall. Mediastinal contours are unchanged. Aortic atherosclerosis. Bilateral interstitial prominence, most pronounced at the lung  bases. Opacities at the right lung  base with basilar atelectasis. Possible small right effusion. No pneumothorax identified. No acute osseous abnormality. IMPRESSION: 1. Right basilar opacity may be secondary to aspiration, pneumonia, or asymmetric pulmonary edema. 2. Cardiomegaly with pulmonary vascular congestion. 3. Possible small right effusion. Electronically Signed   By: Hart Robinsons M.D.   On: 12/10/2023 14:32     Signed, Lorin Glass, MD [facility] 12/10/2023

## 2023-12-10 NOTE — ED Provider Notes (Signed)
[place] EMERGENCY DEPARTMENT AT [facility] Provider Note   CSN: [id number] Arrival date & time: 12/10/23  1242     History  No chief complaint on file.   [name] is a 72 y.o. female presenting from home with concern for hypoglycemia.  Patient is a type I diabetic, on insulin, has a history of a stroke, A-fib, wheelchair dependent, also with a history of heart failure and chronic hypoxia, EMS reports they were called out to the house as the patient was unresponsive, found sitting in her wheelchair near the door.  Initial sugar was in the 30s.  EMS was given Narcan initially by fire rescue with no response and then glucagon and subsequently D10 by EMS improvement of her blood sugar and some mild improvement of her mental status.  Patient is still quite groggy on arrival and cannot verbalize with me.  Per my review of her medical chart and external records she had been hospitalized 3 weeks ago for hypoglycemia, also noted to time to have pulmonary edema on her chest x-ray, treated for potential pneumonia as well.  She is prescribed Tresiba 22 units once daily in the skin and Humalog 3 to 5 units as needed with meals on a sliding scale.  This is based on her last hospital discharge from 3 weeks ago.  She is not able to tell me her current insulin regimen or what she has been giving her self.  HPI     Home Medications Prior to Admission medications   Medication Sig Start Date End Date Taking? Authorizing Provider  acetaminophen (TYLENOL) 650 MG CR tablet Take 650 mg by mouth daily as needed (severe pain).    [provider]  albuterol (VENTOLIN HFA) 108 (90 Base) MCG/ACT inhaler Inhale 2 puffs into the lungs every 6 (six) hours as needed for wheezing or shortness of breath.    [provider]  amLODipine (NORVASC) 2.5 MG tablet Take 2.5 mg by mouth daily.    [provider]  apixaban (ELIQUIS) 5 MG TABS tablet Take 1 tablet (5 mg  total) by mouth 2 (two) times daily. 10/15/23   Yates Decamp, MD  atorvastatin (LIPITOR) 80 MG tablet TAKE 1 TABLET(80 MG) BY MOUTH EVERY EVENING Patient taking differently: Take 80 mg by mouth every evening. 10/23/22   Alicia Amel, MD  b complex vitamins capsule Take 1 capsule by mouth daily.    [provider]  busPIRone (BUSPAR) 5 MG tablet TAKE 1 TABLET(5 MG) BY MOUTH THREE TIMES DAILY Patient taking differently: Take 5 mg by mouth 3 (three) times daily as needed (anxiety). 02/02/23   Alicia Amel, MD  Cholecalciferol (VITAMIN D-3 PO) Take 1 capsule by mouth daily.    [provider]  fluticasone (FLONASE) 50 MCG/ACT nasal spray Place 1 spray into both nostrils daily. 11/27/23   [provider]  furosemide (LASIX) 40 MG tablet Take 1 tablet (40 mg total) by mouth 2 (two) times daily. 02/06/23   Custovic, Rozell Searing, DO  gabapentin (NEURONTIN) 300 MG capsule Take 1 capsule (300 mg total) by mouth as needed. Patient taking differently: Take 300 mg by mouth 3 (three) times daily. 01/09/23   Alicia Amel, MD  HUMALOG 100 UNIT/ML injection Inject 3-5 Units into the skin 3 (three) times daily with meals as needed for high blood sugar. 07/23/23   [provider]  levothyroxine (SYNTHROID) 175 MCG tablet TAKE 1 TABLET(175 MCG) BY MOUTH DAILY BEFORE BREAKFAST Patient taking  differently: Take 175 mcg by mouth daily before breakfast. 12/13/22   Alicia Amel, MD  loratadine (CLARITIN) 10 MG tablet Take 10 mg by mouth daily.    [provider]  losartan (COZAAR) 25 MG tablet Take 1 tablet (25 mg total) by mouth daily. 10/30/23 01/28/24  Yates Decamp, MD  MAGNESIUM-POTASSIUM PO Take 1 each by mouth every evening. Potassium-magnesium combination gummy.    [provider]  OXYGEN Inhale 2 L into the lungs continuous.    [provider]  pantoprazole (PROTONIX) 40 MG tablet Take 1 tablet (40 mg total) by mouth 2 (two) times daily. 10/27/21    Medina-Vargas, Monina C, NP  polyethylene glycol powder (GLYCOLAX/MIRALAX) 17 GM/SCOOP powder Take 17 g by mouth 2 (two) times daily as needed for moderate constipation. Patient taking differently: Take 17 g by mouth daily. 06/21/22   [name], Makiera, MD  TRESIBA FLEXTOUCH 100 UNIT/ML FlexTouch Pen Inject 22 Units into the skin daily. 09/04/23   [provider]  Monte Fantasia INHUB 500-50 MCG/ACT AEPB Inhale 1 puff into the lungs in the morning and at bedtime. 08/31/23   [provider]      Allergies    Latex    Review of Systems   Review of Systems  Physical Exam Updated Vital Signs BP (!) 110/58   Pulse 62   Temp (!) 94.7 F (34.8 C) (Rectal)   Resp 16   Ht 5\' 1"  (1.549 m)   Wt 116.1 kg   SpO2 97%   BMI 48.36 kg/m  Physical Exam Constitutional:      General: She is not in acute distress.    Comments: Diaphoretic, sluggish, somnolent  HENT:     Head: Normocephalic and atraumatic.  Eyes:     Conjunctiva/sclera: Conjunctivae normal.     Pupils: Pupils are equal, round, and reactive to light.  Cardiovascular:     Rate and Rhythm: Normal rate and regular rhythm.  Pulmonary:     Effort: Pulmonary effort is normal. No respiratory distress.  Abdominal:     General: There is no distension.     Tenderness: There is no abdominal tenderness.  Skin:    General: Skin is warm and dry.  Neurological:     General: No focal deficit present.     ED Results / Procedures / Treatments   Labs (all labs ordered are listed, but only abnormal results are displayed) Labs Reviewed  BASIC METABOLIC PANEL - Abnormal; Notable for the following components:      Result Value   Potassium 3.2 (*)    Glucose, Bld 111 (*)    Calcium 8.7 (*)    All other components within normal limits  CBC WITH DIFFERENTIAL/PLATELET - Abnormal; Notable for the following components:   RBC 3.45 (*)    Hemoglobin 10.0 (*)    HCT 33.7 (*)    MCHC 29.7 (*)    Platelets 110 (*)    Lymphs Abs  0.5 (*)    All other components within normal limits  TSH - Abnormal; Notable for the following components:   TSH 0.139 (*)    All other components within normal limits  T4, FREE - Abnormal; Notable for the following components:   Free T4 2.31 (*)    All other components within normal limits  CBG MONITORING, ED - Abnormal; Notable for the following components:   Glucose-Capillary 111 (*)    All other components within normal limits  CBG MONITORING, ED - Abnormal; Notable for the  following components:   Glucose-Capillary 141 (*)    All other components within normal limits  CULTURE, BLOOD (ROUTINE X 2)  CULTURE, BLOOD (ROUTINE X 2)  URINALYSIS, ROUTINE W REFLEX MICROSCOPIC  CBG MONITORING, ED  I-STAT CG4 LACTIC ACID, ED    EKG EKG Interpretation Date/Time:  Monday December 10 2023 12:51:24 EST Ventricular Rate:  65 PR Interval:  313 QRS Duration:  177 QT Interval:  511 QTC Calculation: 532 R Axis:   119  Text Interpretation: Sinus rhythm Prolonged PR interval Nonspecific intraventricular conduction delay Confirmed by Alvester Chou [phone]) on 12/10/2023 12:53:11 PM  Radiology DG Chest Port 1 View Result Date: 12/10/2023 CLINICAL DATA:  aspiration evaluation. EXAM: PORTABLE CHEST 1 VIEW COMPARISON:  Chest radiograph dated November 18, 2023. FINDINGS: The heart size is enlarged. Stable left-sided dual lead pacemaker. Loop recorder device overlies the left lower chest wall. Mediastinal contours are unchanged. Aortic atherosclerosis. Bilateral interstitial prominence, most pronounced at the lung bases. Opacities at the right lung base with basilar atelectasis. Possible small right effusion. No pneumothorax identified. No acute osseous abnormality. IMPRESSION: 1. Right basilar opacity may be secondary to aspiration, pneumonia, or asymmetric pulmonary edema. 2. Cardiomegaly with pulmonary vascular congestion. 3. Possible small right effusion. Electronically Signed   By: Hart Robinsons  M.D.   On: 12/10/2023 14:32    Procedures Procedures    Medications Ordered in ED Medications  sodium chloride flush (NS) 0.9 % injection 3 mL (3 mLs Intravenous Not Given 12/10/23 1630)  sodium chloride flush (NS) 0.9 % injection 3 mL (has no administration in time range)  0.9 %  sodium chloride infusion (has no administration in time range)  cefTRIAXone (ROCEPHIN) 1 g in sodium chloride 0.9 % 100 mL IVPB (1 g Intravenous New Bag/Given 12/10/23 1626)  sodium chloride 0.9 % bolus 1,000 mL (0 mLs Intravenous Stopped 12/10/23 1525)  doxycycline (VIBRA-TABS) tablet 100 mg (100 mg Oral Given 12/10/23 1610)    ED Course/ Medical Decision Making/ A&P Clinical Course as of 12/10/23 1632  Mon Dec 10, 2023  1341 Requesting rectal temp  [MT]  1431 Patient is now wide-awake and mentating normally, is able to eat half of the meal tray.  Her family members present at the bedside.  The patient reports she took 20 units of Tresiba in the morning and is on a sliding scale of insulin with meals, but she cannot recollect whether she had a meal this morning. [MT]  1527 Requesting temp again from RN - patient now at CT.   Planning for likely admission for BS monitoring following CT imaging results and completion of vital signs [MT]  1631 Rectal temp noted to be hypothermic 94.7.  The patient is being actively externally rewarmed but I have added a Bair hugger order, now blood cultures and lactic [MT]    Clinical Course User Index [MT] Chalon Zobrist, Kermit Balo, MD                                 Medical Decision Making Amount and/or Complexity of Data Reviewed Labs: ordered. Radiology: ordered.  Risk Prescription drug management.   Patient is presenting to ED with concern for hypoglycemia and unresponsiveness.  Her mental status and her blood sugar rapidly improved with glucagon, subsequently she tolerated p.o. fluids as well as ate a meal.  Blood sugars appear to have stabilized.  Her medical condition  is at risk due to her significant comorbidities,  including type 1 diabetes, dependence on insulin, prior episodes of hypoglycemia.  This carries a high risk morbidity and mortality.  Patient was given IV Rocephin and doxycycline for aspiration pneumonia.  She was given IV fluid bolus for hypotension.  He was initially started on D10 infusion but this was subsequently weaned off as her blood sugars remained stable and she was eating.  Supplemental history is provided by EMS as well as the patient's niece present at the bedside.  Prior medical records reviewed including her recent hospital discharge summary from 3 weeks ago.  Patient is noted to have some borderline hypotension which is a chronic ongoing issue for her, but mentating well otherwise.  She had some transient hypoxia which improved with increased respiratory effort.  However when I reviewed her chest x-ray, there was some concern for aspiration, and I think antibiotics would be reasonable in this setting as she did likely aspirate.  I discussed with the patient and her family the option of an observation stay in the hospital, which I think is the safest plan, to ensure she does not have further hypoglycemic episodes.  They are in agreement with this.  EKG per my interpretation shows a paced rhythm with her known history of complete heart block.  She is known to have low blood pressure for which her metoprolol was held on prior admission to the hospital.  Her blood pressures can be quite labile.  Patient is signed out to Dr Jodi Mourning EDP at 3:40 pm pending imaging, vital sign reassessment, and medical admission.        Final Clinical Impression(s) / ED Diagnoses Final diagnoses:  Hypoglycemia  Aspiration pneumonia of right lower lobe, unspecified aspiration pneumonia type (HCC)  Hypothermia, initial encounter    Rx / DC Orders ED Discharge Orders     None         Royce Stegman, Kermit Balo, MD 12/10/23 [phone]

## 2023-12-10 NOTE — ED Notes (Signed)
ED TO INPATIENT HANDOFF REPORT  ED Nurse Name and Phone #: Durene Cal RN [phone]  S Name/Age/Gender [name] 72 y.o. female Room/Bed: 015C/015C  Code Status   Code Status: Full Code  Home/SNF/Other Home Patient oriented to: self, place, time, and situation Is this baseline? Yes   Triage Complete: Triage complete  Chief Complaint Hypoglycemia [E16.2]  Triage Note PT unresponsive in powered wheelchair at home.  Fire assessed pinpoint pupils on scene and gave Narcan with no response. Glucose found to be 38 on scene.  Difficulty with IV.  Glucagon given, IV established shortly before arrival and D10 started.    BP 100/60, 100% RA   Allergies Allergies  Allergen Reactions   Latex Rash    Level of Care/Admitting Diagnosis ED Disposition     ED Disposition  Admit   Condition  --   Comment  Hospital Area: [facility] [[id number]]  Level of Care: Telemetry Medical [104]  May place patient in observation at [facility] or [facility] if equivalent level of care is available:: No  Covid Evaluation: Asymptomatic - no recent exposure (last 10 days) testing not required  Diagnosis: Hypoglycemia [242204]  Admitting Physician: Lorin Glass [[id number]]  Attending Physician: Lorin Glass [[id number]]          B Medical/Surgery History Past Medical History:  Diagnosis Date   Abnormal echocardiogram    Anemia    Arthritis    BPPV (benign paroxysmal positional vertigo), right 06/13/2017   Bradycardia 03/07/2020   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   CHB (complete heart block) (HCC) 03/08/2020   CHF (congestive heart failure) (HCC)    Closed fracture of left proximal humerus 07/08/2019   Decreased range of motion of left shoulder 09/03/2020   Depression    Dizzy 03/07/2020   bradycardia   Edentulous 09/03/2020   GERD (gastroesophageal reflux disease)    Hemiparesis affecting left side as late  effect of cerebrovascular accident (CVA) (HCC) 03/08/2020   Hemorrhoids    History of Falls with injury 03/08/2020   Fall resulting in left humeral fracture   Hyperlipidemia    Hypertension    Hypothyroidism    Intracranial vascular stenosis    Lacunar infarction (HCC)    Brain MRI 07/2021 multiple small remote pontine lacunar infarcts   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  Severe chronic microvascular ischemic disease of the pons, left middle cerebellar peduncle, and mini remote lacunar infarcts, progressive since 2019.  Remote pontine microhemorrhage most likely of hypertensive etiology.  10/10/21 CT reveals chronic atrophic changes.     Malignant tumor of thyroid gland (HCC) 03/27/2022   Near syncope 03/08/2020   Paroxysmal atrial fibrillation (HCC)    Presence of permanent cardiac pacemaker    Proteinuria 03/08/2020   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 06/25/20 06/26/2020   Seasonal allergies    Stroke (HCC)    Suicidal ideation 11/16/2021   Thyroid cancer (HCC)    per pt S/p Total Thyroidectomy with Radioactive Iodine Therapy   Type 1 diabetes mellitus (HCC)    Past Surgical History:  Procedure Laterality Date   ABDOMINAL ANGIOGRAM N/A 05/14/2012   Procedure: ABDOMINAL ANGIOGRAM;  Surgeon: [name], MD;  Location[facility] C[facility] LAB;  Service: Cardiovascular;  Laterality: N/A;   ABDOMINAL HYSTERECTOMY  1995   partial  ANKLE FRACTURE SURGERY Right    CARDIAC CATHETERIZATION     with coronary angiogram   cataract  Bilateral 2018   COLONOSCOPY N/A 09/09/2014   Procedure: COLONOSCOPY;  Surgeon: Iva Boop, MD;  Location: [facility] ENDOSCOPY;  Service: Endoscopy;  Laterality: N/A;   ESOPHAGOGASTRODUODENOSCOPY (EGD) WITH PROPOFOL N/A 12/03/2018   Procedure: ESOPHAGOGASTRODUODENOSCOPY (EGD) WITH PROPOFOL;  Surgeon: Sherrilyn Rist, MD;   Location: [facility] ENDOSCOPY;  Service: Gastroenterology;  Laterality: N/A;   LEFT AND RIGHT HEART CATHETERIZATION WITH CORONARY ANGIOGRAM N/A 05/14/2012   Procedure: LEFT AND RIGHT HEART CATHETERIZATION WITH CORONARY ANGIOGRAM;  Surgeon: [name], MD;  Location: [facility] CATH LAB;  Service: Cardiovascular;  Laterality: N/A;   LOOP RECORDER INSERTION N/A 12/23/2018   Procedure: LOOP RECORDER INSERTION;  Surgeon: Regan Lemming, MD;  Location: [facility] INVASIVE CV LAB;  Service: Cardiovascular;  Laterality: N/A;   LOOP RECORDER REMOVAL N/A 06/25/2020   Procedure: LOOP RECORDER REMOVAL;  Surgeon: Regan Lemming, MD;  Location: [facility] INVASIVE CV LAB;  Service: Cardiovascular;  Laterality: N/A;   MINI METER   09/08/2014   ELECTRONIC INSULIN PUMP   PACEMAKER IMPLANT N/A 06/25/2020   Procedure: PACEMAKER IMPLANT;  Surgeon: Regan Lemming, MD;  Location: [facility] INVASIVE CV LAB;  Service: Cardiovascular;  Laterality: N/A;   TEE WITHOUT CARDIOVERSION N/A 10/16/2018   Procedure: TRANSESOPHAGEAL ECHOCARDIOGRAM (TEE);  Surgeon: Jodelle Red, MD;  Location: [facility] ENDOSCOPY;  Service: Cardiovascular;  Laterality: N/A;   THYROIDECTOMY  2006   for thyroid cancer     A IV Location/Drains/Wounds Patient Lines/Drains/Airways Status     Active Line/Drains/Airways     Name Placement date Placement time Site Days   Peripheral IV 12/10/23 20 G Left;Posterior Hand 12/10/23  1325  Hand  less than 1   Peripheral IV 12/10/23 22 G Posterior;Right Hand 12/10/23  1231  Hand  less than 1   Pressure Injury 08/10/21 Heel Left;Lateral Unstageable - Full thickness tissue loss in which the base of the injury is covered by slough (yellow, tan, gray, green or brown) and/or eschar (tan, brown or black) in the wound bed. 08/10/21  --  -- 852   Pressure Injury 02/12/22 Heel Posterior;Right 02/12/22  0947  -- 666            Intake/Output Last 24 hours  Intake/Output Summary (Last 24 hours) at 12/10/2023  2043 Last data filed at 12/10/2023 1353 Gross per 24 hour  Intake 321.45 ml  Output --  Net 321.45 ml    Labs/Imaging Results for orders placed or performed during the hospital encounter of 12/10/23 (from the past 48 hours)  CBG monitoring, ED (now and then every hour for 3 hours)     Status: None   Collection Time: 12/10/23 12:44 PM  Result Value Ref Range   Glucose-Capillary 83 70 - 99 mg/dL    Comment: Glucose reference range applies only to samples taken after fasting for at least 8 hours.  CBG monitoring, ED (now and then every hour for 3 hours)     Status: Abnormal   Collection Time: 12/10/23  1:05 PM  Result Value Ref Range   Glucose-Capillary 111 (H) 70 - 99 mg/dL    Comment: Glucose reference range applies only to samples taken after fasting for at least 8 hours.  Basic metabolic panel     Status: Abnormal   Collection Time: 12/10/23  1:07 PM  Result Value Ref Range   Sodium 139 135 - 145 mmol/L   Potassium  3.2 (L) 3.5 - 5.1 mmol/L   Chloride 104 98 - 111 mmol/L   CO2 27 22 - 32 mmol/L   Glucose, Bld 111 (H) 70 - 99 mg/dL    Comment: Glucose reference range applies only to samples taken after fasting for at least 8 hours.   BUN 14 8 - 23 mg/dL   Creatinine, Ser 7.82 0.44 - 1.00 mg/dL   Calcium 8.7 (L) 8.9 - 10.3 mg/dL   GFR, Estimated >95 >62 mL/min    Comment: (NOTE) Calculated using the CKD-EPI Creatinine Equation (2021)    Anion gap 8 5 - 15    Comment: Performed at [facility], [address] [place], [place] [address]  CBC with Differential     Status: Abnormal   Collection Time: 12/10/23  1:07 PM  Result Value Ref Range   WBC 5.6 4.0 - 10.5 K/uL   RBC 3.45 (L) 3.87 - 5.11 MIL/uL   Hemoglobin 10.0 (L) 12.0 - 15.0 g/dL   HCT 57.8 (L) 46.9 - 62.9 %   MCV 97.7 80.0 - 100.0 fL   MCH 29.0 26.0 - 34.0 pg   MCHC 29.7 (L) 30.0 - 36.0 g/dL   RDW 52.8 41.3 - 24.4 %   Platelets 110 (L) 150 - 400 K/uL    Comment: REPEATED TO VERIFY   nRBC 0.0 0.0 - 0.2 %    Neutrophils Relative % 84 %   Neutro Abs 4.7 1.7 - 7.7 K/uL   Lymphocytes Relative 10 %   Lymphs Abs 0.5 (L) 0.7 - 4.0 K/uL   Monocytes Relative 6 %   Monocytes Absolute 0.3 0.1 - 1.0 K/uL   Eosinophils Relative 0 %   Eosinophils Absolute 0.0 0.0 - 0.5 K/uL   Basophils Relative 0 %   Basophils Absolute 0.0 0.0 - 0.1 K/uL   Immature Granulocytes 0 %   Abs Immature Granulocytes 0.02 0.00 - 0.07 K/uL    Comment: Performed at [facility], [address], [place], [place] [address]  TSH     Status: Abnormal   Collection Time: 12/10/23  1:07 PM  Result Value Ref Range   TSH 0.139 (L) 0.350 - 4.500 uIU/mL    Comment: Performed by a 3rd Generation assay with a functional sensitivity of <=0.01 uIU/mL. Performed at [facility], [address], [place], [place] [address]   T4, free     Status: Abnormal   Collection Time: 12/10/23  1:07 PM  Result Value Ref Range   Free T4 2.31 (H) 0.61 - 1.12 ng/dL    Comment: (NOTE) Biotin ingestion may interfere with free T4 tests. If the results are inconsistent with the TSH level, previous test results, or the clinical presentation, then consider biotin interference. If needed, order repeat testing after stopping biotin. Performed at [facility], [address], [place], [place] [address]   CBG monitoring, ED (now and then every hour for 3 hours)     Status: Abnormal   Collection Time: 12/10/23  1:54 PM  Result Value Ref Range   Glucose-Capillary 141 (H) 70 - 99 mg/dL    Comment: Glucose reference range applies only to samples taken after fasting for at least 8 hours.  Lactic acid, plasma     Status: None   Collection Time: 12/10/23  5:34 PM  Result Value Ref Range   Lactic Acid, Venous 1.9 0.5 - 1.9 mmol/L    Comment: Performed at [facility], [address], [place], [place] [address]  CBG monitoring, ED     Status: Abnormal   Collection Time: 12/10/23  6:15 PM  Result Value Ref Range   Glucose-Capillary 238 (H)  70 - 99 mg/dL    Comment: Glucose reference range applies only to samples taken after fasting for at least 8 hours.  I-Stat CG4 Lactic Acid     Status: None   Collection Time: 12/10/23  8:11 PM  Result Value Ref Range   Lactic Acid, Venous 1.2 0.5 - 1.9 mmol/L   *Note: Due to a large number of results and/or encounters for the requested time period, some results have not been displayed. A complete set of results can be found in Results Review.   DG Chest Port 1 View Result Date: 12/10/2023 CLINICAL DATA:  aspiration evaluation. EXAM: PORTABLE CHEST 1 VIEW COMPARISON:  Chest radiograph dated November 18, 2023. FINDINGS: The heart size is enlarged. Stable left-sided dual lead pacemaker. Loop recorder device overlies the left lower chest wall. Mediastinal contours are unchanged. Aortic atherosclerosis. Bilateral interstitial prominence, most pronounced at the lung bases. Opacities at the right lung base with basilar atelectasis. Possible small right effusion. No pneumothorax identified. No acute osseous abnormality. IMPRESSION: 1. Right basilar opacity may be secondary to aspiration, pneumonia, or asymmetric pulmonary edema. 2. Cardiomegaly with pulmonary vascular congestion. 3. Possible small right effusion. Electronically Signed   By: Hart Robinsons M.D.   On: 12/10/2023 14:32    Pending Labs Unresulted Labs (From admission, onward)     Start     Ordered   12/11/23 0500  Basic metabolic panel  Tomorrow morning,   R        12/10/23 1827   12/11/23 0500  CBC  Tomorrow morning,   R        12/10/23 1827   12/10/23 1801  Brain natriuretic peptide  Add-on,   AD        12/10/23 1800   12/10/23 1632  Blood culture (routine x 2)  BLOOD CULTURE X 2,   R (with STAT occurrences)      12/10/23 1631   12/10/23 1249  Urinalysis, Routine w reflex microscopic -Urine, Clean Catch  Once,   URGENT       Question:  Specimen Source  Answer:  Urine, Clean Catch   12/10/23 1249             Vitals/Pain Today's Vitals   12/10/23 1715 12/10/23 1730 12/10/23 1745 12/10/23 1800  BP: (!) 115/47 (!) 122/45 (!) 123/47 (!) 117/48  Pulse: (!) 54 (!) 52 (!) 55 (!) 53  Resp: 11 (!) 9 (!) 9 (!) 8  Temp:      TempSrc:      SpO2: 100% 100% 100% 100%  Weight:      Height:      PainSc:        Isolation Precautions No active isolations  Medications Medications  sodium chloride flush (NS) 0.9 % injection 3 mL (3 mLs Intravenous Not Given 12/10/23 1630)  sodium chloride flush (NS) 0.9 % injection 3 mL (has no administration in time range)  0.9 %  sodium chloride infusion (has no administration in time range)  insulin aspart (novoLOG) injection 0-9 Units (has no administration in time range)  insulin aspart (novoLOG) injection 0-5 Units (has no administration in time range)  acetaminophen (TYLENOL) tablet 650 mg (has no administration in time range)    Or  acetaminophen (TYLENOL) suppository 650 mg (has no administration in time  range)  polyethylene glycol (MIRALAX / GLYCOLAX) packet 17 g (has no administration in time range)  bisacodyl (DULCOLAX) EC tablet 5 mg (has no administration in time range)  albuterol (PROVENTIL) (2.5 MG/3ML) 0.083% nebulizer solution 2.5 mg (has no administration in time range)  hydrALAZINE (APRESOLINE) injection 10 mg (has no administration in time range)  apixaban (ELIQUIS) tablet 5 mg (has no administration in time range)  atorvastatin (LIPITOR) tablet 80 mg (has no administration in time range)  busPIRone (BUSPAR) tablet 5 mg (has no administration in time range)  gabapentin (NEURONTIN) capsule 300 mg (has no administration in time range)  levothyroxine (SYNTHROID) tablet 175 mcg (has no administration in time range)  pantoprazole (PROTONIX) EC tablet 40 mg (has no administration in time range)  Ampicillin-Sulbactam (UNASYN) 3 g in sodium chloride 0.9 % 100 mL IVPB (has no administration in time range)  sodium chloride 0.9 % bolus 1,000 mL (0 mLs  Intravenous Stopped 12/10/23 1525)  cefTRIAXone (ROCEPHIN) 1 g in sodium chloride 0.9 % 100 mL IVPB (0 g Intravenous Stopped 12/10/23 1732)  doxycycline (VIBRA-TABS) tablet 100 mg (100 mg Oral Given 12/10/23 1610)    Mobility power wheelchair     Focused Assessments     R Recommendations: See Admitting Provider Note  Report given to:   Additional Notes:

## 2023-12-10 NOTE — Progress Notes (Signed)
Pharmacy Antibiotic Note  [name] is a 72 y.o. female admitted on 12/10/2023 with aspiration pneumonia.  Pharmacy has been consulted for Unasyn (ampicillin-sulbactam) dosing.  Pt received Ceftriaxone 1g IV x1 and Doxycycline 100mg  IV x1 in ED   WBC 5.6, Temp 94.7, LA 1.9 Scr 0.88  Plan: Unasyn 3g IV q6h Monitor daily CBC, temp, SCr, and for clinical signs of improvement  F/u cultures and de-escalate antibiotics as able   Height: 5\' 1"  (154.9 cm) Weight: 116.1 kg (255 lb 15.3 oz) IBW/kg (Calculated) : 47.8  Temp (24hrs), Avg:94.7 F (34.8 C), Min:94.7 F (34.8 C), Max:94.7 F (34.8 C)  Recent Labs  Lab 12/10/23 1307 12/10/23 1734  WBC 5.6  --   CREATININE 0.88  --   LATICACIDVEN  --  1.9    Estimated Creatinine Clearance: 68.5 mL/min (by C-G formula based on SCr of 0.88 mg/dL).    Allergies  Allergen Reactions   Latex Rash    Antimicrobials this admission: Ceftriaxone 12/16 x1 Doxycycline 12/16 x1 Unasyn 12/16 >>   Dose adjustments this admission: N/A  Microbiology results: 12/16 BCx: sent   Thank you for allowing pharmacy to be a part of this patient's care.  Wilburn Cornelia, PharmD, BCPS Clinical Pharmacist 12/10/2023 7:51 PM   Please refer to [facility] for pharmacy phone number

## 2023-12-10 NOTE — ED Notes (Signed)
Bare hugger d/c

## 2023-12-10 NOTE — ED Provider Notes (Signed)
Patient care signed out to follow-up CT scan head results, monitor vitals and admit to the hospital.  Patient evaluated for recurrent hypoglycemia, glucose levels improved tolerated oral liquids and food.  Sugar levels normalized and mentation normalized.  Delay in temperature, came back hypothermic, bear hugger started and patient continue to make improvements.  Blood pressure normalized 110 systolic.  Antibiotics were given, blood cultures were added at signout.  Lactic acid pending.  CT head results received from radiology and reviewed paperwork no acute abnormalities.  On recheck patient has general mild weakness, alert and oriented, mild dry mucous membranes.  Heart rate and blood pressure normal in the room.  Paged hospitalist for admission.  .Critical Care  Performed by: Blane Ohara, MD Authorized by: Blane Ohara, MD   Critical care provider statement:    Critical care time (minutes):  80   Critical care start time:  12/10/2023 4:00 PM   Critical care end time:  12/10/2023 5:20 PM   Critical care time was exclusive of:  Separately billable procedures and treating other patients and teaching time   Critical care was necessary to treat or prevent imminent or life-threatening deterioration of the following conditions:  Sepsis  Patient was not given 30 cc/kg due to improvement with temperature and glucose management and other treatment and concern for pulmonary edema by initial team.  Hypoglycemia  Aspiration pneumonia of right lower lobe, unspecified aspiration pneumonia type (HCC)  Hypothermia, initial encounter    Blane Ohara, MD 12/10/23 1753

## 2023-12-11 DIAGNOSIS — Z85850 Personal history of malignant neoplasm of thyroid: Secondary | ICD-10-CM | POA: Diagnosis not present

## 2023-12-11 DIAGNOSIS — E662 Morbid (severe) obesity with alveolar hypoventilation: Secondary | ICD-10-CM

## 2023-12-11 DIAGNOSIS — E11649 Type 2 diabetes mellitus with hypoglycemia without coma: Secondary | ICD-10-CM | POA: Diagnosis not present

## 2023-12-11 DIAGNOSIS — Z7901 Long term (current) use of anticoagulants: Secondary | ICD-10-CM | POA: Diagnosis not present

## 2023-12-11 DIAGNOSIS — I110 Hypertensive heart disease with heart failure: Secondary | ICD-10-CM | POA: Diagnosis present

## 2023-12-11 DIAGNOSIS — E10649 Type 1 diabetes mellitus with hypoglycemia without coma: Secondary | ICD-10-CM | POA: Diagnosis present

## 2023-12-11 DIAGNOSIS — I10 Essential (primary) hypertension: Secondary | ICD-10-CM

## 2023-12-11 DIAGNOSIS — J690 Pneumonitis due to inhalation of food and vomit: Secondary | ICD-10-CM

## 2023-12-11 DIAGNOSIS — J449 Chronic obstructive pulmonary disease, unspecified: Secondary | ICD-10-CM | POA: Diagnosis not present

## 2023-12-11 DIAGNOSIS — X58XXXA Exposure to other specified factors, initial encounter: Secondary | ICD-10-CM | POA: Diagnosis present

## 2023-12-11 DIAGNOSIS — Z79890 Hormone replacement therapy (postmenopausal): Secondary | ICD-10-CM | POA: Diagnosis not present

## 2023-12-11 DIAGNOSIS — E1059 Type 1 diabetes mellitus with other circulatory complications: Secondary | ICD-10-CM | POA: Diagnosis present

## 2023-12-11 DIAGNOSIS — E109 Type 1 diabetes mellitus without complications: Secondary | ICD-10-CM | POA: Diagnosis not present

## 2023-12-11 DIAGNOSIS — I442 Atrioventricular block, complete: Secondary | ICD-10-CM | POA: Diagnosis present

## 2023-12-11 DIAGNOSIS — Z993 Dependence on wheelchair: Secondary | ICD-10-CM | POA: Diagnosis not present

## 2023-12-11 DIAGNOSIS — J9611 Chronic respiratory failure with hypoxia: Secondary | ICD-10-CM | POA: Diagnosis not present

## 2023-12-11 DIAGNOSIS — Z6841 Body Mass Index (BMI) 40.0 and over, adult: Secondary | ICD-10-CM | POA: Diagnosis not present

## 2023-12-11 DIAGNOSIS — D696 Thrombocytopenia, unspecified: Secondary | ICD-10-CM | POA: Diagnosis present

## 2023-12-11 DIAGNOSIS — E785 Hyperlipidemia, unspecified: Secondary | ICD-10-CM | POA: Diagnosis present

## 2023-12-11 DIAGNOSIS — I5032 Chronic diastolic (congestive) heart failure: Secondary | ICD-10-CM

## 2023-12-11 DIAGNOSIS — I69354 Hemiplegia and hemiparesis following cerebral infarction affecting left non-dominant side: Secondary | ICD-10-CM | POA: Diagnosis not present

## 2023-12-11 DIAGNOSIS — I69322 Dysarthria following cerebral infarction: Secondary | ICD-10-CM | POA: Diagnosis not present

## 2023-12-11 DIAGNOSIS — Z794 Long term (current) use of insulin: Secondary | ICD-10-CM | POA: Diagnosis not present

## 2023-12-11 DIAGNOSIS — E1042 Type 1 diabetes mellitus with diabetic polyneuropathy: Secondary | ICD-10-CM | POA: Diagnosis not present

## 2023-12-11 DIAGNOSIS — E162 Hypoglycemia, unspecified: Secondary | ICD-10-CM | POA: Diagnosis present

## 2023-12-11 DIAGNOSIS — T68XXXA Hypothermia, initial encounter: Secondary | ICD-10-CM | POA: Diagnosis present

## 2023-12-11 DIAGNOSIS — Z9981 Dependence on supplemental oxygen: Secondary | ICD-10-CM

## 2023-12-11 DIAGNOSIS — I480 Paroxysmal atrial fibrillation: Secondary | ICD-10-CM | POA: Diagnosis present

## 2023-12-11 DIAGNOSIS — F32A Depression, unspecified: Secondary | ICD-10-CM | POA: Diagnosis present

## 2023-12-11 DIAGNOSIS — E890 Postprocedural hypothyroidism: Secondary | ICD-10-CM | POA: Diagnosis not present

## 2023-12-11 DIAGNOSIS — Z950 Presence of cardiac pacemaker: Secondary | ICD-10-CM | POA: Diagnosis not present

## 2023-12-11 LAB — RESPIRATORY PANEL BY PCR

## 2023-12-11 LAB — CBC
HCT: 30.1 % — ABNORMAL LOW (ref 36.0–46.0)
Hemoglobin: 9.6 g/dL — ABNORMAL LOW (ref 12.0–15.0)
MCH: 29.9 pg (ref 26.0–34.0)
MCHC: 31.9 g/dL (ref 30.0–36.0)
MCV: 93.8 fL (ref 80.0–100.0)
Platelets: 123 10*3/uL — ABNORMAL LOW (ref 150–400)
RBC: 3.21 MIL/uL — ABNORMAL LOW (ref 3.87–5.11)
RDW: 13.1 % (ref 11.5–15.5)
WBC: 4.2 10*3/uL (ref 4.0–10.5)
nRBC: 0 % (ref 0.0–0.2)

## 2023-12-11 LAB — URINALYSIS, ROUTINE W REFLEX MICROSCOPIC
Bilirubin Urine: NEGATIVE
Glucose, UA: NEGATIVE mg/dL
Hgb urine dipstick: NEGATIVE
Ketones, ur: NEGATIVE mg/dL
Leukocytes,Ua: NEGATIVE
Nitrite: NEGATIVE
Protein, ur: NEGATIVE mg/dL
Specific Gravity, Urine: 1.023 (ref 1.005–1.030)
pH: 5 (ref 5.0–8.0)

## 2023-12-11 LAB — BASIC METABOLIC PANEL
Anion gap: 6 (ref 5–15)
BUN: 14 mg/dL (ref 8–23)
CO2: 27 mmol/L (ref 22–32)
Calcium: 8.3 mg/dL — ABNORMAL LOW (ref 8.9–10.3)
Chloride: 104 mmol/L (ref 98–111)
Creatinine, Ser: 1.08 mg/dL — ABNORMAL HIGH (ref 0.44–1.00)
GFR, Estimated: 55 mL/min — ABNORMAL LOW (ref >60)
Glucose, Bld: 210 mg/dL — ABNORMAL HIGH (ref 70–99)
Potassium: 3.7 mmol/L (ref 3.5–5.1)
Sodium: 137 mmol/L (ref 135–145)

## 2023-12-11 LAB — GLUCOSE, CAPILLARY
Glucose-Capillary: 266 mg/dL — ABNORMAL HIGH (ref 70–99)
Glucose-Capillary: 222 mg/dL — ABNORMAL HIGH (ref 70–99)
Glucose-Capillary: 215 mg/dL — ABNORMAL HIGH (ref 70–99)
Glucose-Capillary: 241 mg/dL — ABNORMAL HIGH (ref 70–99)
Glucose-Capillary: 159 mg/dL — ABNORMAL HIGH (ref 70–99)
Glucose-Capillary: 171 mg/dL — ABNORMAL HIGH (ref 70–99)
Glucose-Capillary: 196 mg/dL — ABNORMAL HIGH (ref 70–99)

## 2023-12-11 LAB — SARS CORONAVIRUS 2 BY RT PCR: SARS Coronavirus 2 by RT PCR: NEGATIVE

## 2023-12-11 MED ORDER — SODIUM CHLORIDE 0.9% FLUSH
3.0000 mL | Freq: Two times a day (BID) | INTRAVENOUS | Status: DC
  Administered 2023-12-11 – 2023-12-12 (×2): 3 mL via INTRAVENOUS

## 2023-12-11 MED ORDER — ALUM & MAG HYDROXIDE-SIMETH 200-200-20 MG/5ML PO SUSP
15.0000 mL | Freq: Four times a day (QID) | ORAL | Status: DC | PRN
  Administered 2023-12-11: 15 mL via ORAL
  Filled 2023-12-11: qty 30

## 2023-12-11 MED ORDER — INSULIN GLARGINE-YFGN 100 UNIT/ML ~~LOC~~ SOLN
10.0000 [IU] | Freq: Every day | SUBCUTANEOUS | Status: DC
  Administered 2023-12-11: 10 [IU] via SUBCUTANEOUS
  Filled 2023-12-11 (×2): qty 0.1

## 2023-12-11 MED ORDER — ONDANSETRON HCL 4 MG/2ML IJ SOLN
4.0000 mg | Freq: Four times a day (QID) | INTRAMUSCULAR | Status: DC | PRN
  Administered 2023-12-11: 4 mg via INTRAVENOUS
  Filled 2023-12-11: qty 2

## 2023-12-11 MED ORDER — AMOXICILLIN-POT CLAVULANATE 875-125 MG PO TABS
1.0000 | ORAL_TABLET | Freq: Two times a day (BID) | ORAL | Status: DC
  Administered 2023-12-12: 1 via ORAL
  Filled 2023-12-11: qty 1

## 2023-12-11 NOTE — Assessment & Plan Note (Signed)
12-11-2023 chronic.

## 2023-12-11 NOTE — Assessment & Plan Note (Signed)
12-11-2023 stable. euvolemic

## 2023-12-11 NOTE — Inpatient Diabetes Management (Signed)
Inpatient Diabetes Program Recommendations  AACE/ADA: New Consensus Statement on Inpatient Glycemic Control (2015)  Target Ranges:  Prepandial:   less than 140 mg/dL      Peak postprandial:   less than 180 mg/dL (1-2 hours)      Critically ill patients:  140 - 180 mg/dL   Lab Results  Component Value Date   GLUCAP 241 (H) 12/11/2023   HGBA1C 7.0 (H) 11/18/2023    Review of Glycemic Control  Diabetes history: DM2 Outpatient Diabetes medications: Humalog 3-5 units TID, Tresiba 20 daily  Current orders for Inpatient glycemic control: Semglee 10 at bedtime, Novolog 0-9 TID with meals and 0-5 HS   Inpatient Diabetes Program Recommendations:    Consider adding meal coverage insulin if eating > 50% - Novolog 3 units TID  Continue to follow.  Thank you. Ailene Ards, RD, LDN, CDCES Inpatient Diabetes Coordinator [phone]

## 2023-12-11 NOTE — Assessment & Plan Note (Signed)
12-11-2023 due to pt taking in SQ insulin and not eating. No intention of self-harm. 12-12-2023 resolved. Reminded pt that she needs to eat food after taking insulin at home.

## 2023-12-11 NOTE — Assessment & Plan Note (Signed)
12-11-2023 stable. Restart tresiba 12-12-2023 DC to home on same doses of insulins. Pt reminded to eat after taking insulin. This included bedtime snack.

## 2023-12-11 NOTE — Plan of Care (Signed)
  Problem: Coping: Goal: Ability to adjust to condition or change in health will improve Outcome: Progressing   Problem: Fluid Volume: Goal: Ability to maintain a balanced intake and output will improve Outcome: Progressing   Problem: Health Behavior/Discharge Planning: Goal: Ability to identify and utilize available resources and services will improve Outcome: Progressing   Problem: Metabolic: Goal: Ability to maintain appropriate glucose levels will improve Outcome: Progressing   Problem: Nutritional: Goal: Maintenance of adequate nutrition will improve Outcome: Progressing Goal: Progress toward achieving an optimal weight will improve Outcome: Progressing

## 2023-12-11 NOTE — Subjective & Objective (Addendum)
Pt seen and examined. Febrile to 100.9 this AM. Pt states her sister lives with her but she works.  Pt takes her insulin at home by herself. She does not eat breakfast in the AM, despite taking AM dosed insulin.  Pt feeling better. Chronically on 2L/min O2.

## 2023-12-11 NOTE — Hospital Course (Signed)
HPI: [name] is a 72 y.o. female with PMH significant for DM2, HTN, HLD, CVA with left hemiparesis, wheelchair-bound status, paroxysmal A-fib on Eliquis, CHB s/p PPM, CHF on 2 L oxygen nasal cannula , hypothyroidism, thyroid cancer, Patient was brought to the ED today by EMS with concern of hypoglycemia. In the morning today, family found patient unresponsive, sitting on a wheelchair near the door.  EMS was called.  Noted blood sugar level low in 30s.  She was given D10 and glucagon with gradual improvement in mental status.  Brought to the ED. Of note, she was recently hospitalized 3 weeks ago for hypoglycemia and possible pneumonia   In the ED, she was hypothermic to 94.7, Bair hugger was started.  Heart rate in 50s.  Blood pressure was initially in 80s and later improved O2 sat was low in 80s and later improved on oxygen by nasal cannula. Patient's mental status gradually improved in the ED.  She reported she took her insulin in the morning but could not recall if she ate any meal. Labs showed WC count normal at 5.6, hemoglobin 10, platelet level low at 110, potassium low at 3.2 TSH low at 0.131, free T4 elevated to 2.31 Chest x-ray showed right basilar opacity may be secondary to aspiration, pneumonia, or asymmetric pulmonary edema. EKG shows normal sinus rhythm at 65 bpm, QTc 535 ms Given hypothermia, hypotension, hypoxia and infiltrates in chest x-ray, patient was suspected to have aspiration pneumonia while unresponsive.  She was started on broad-spectrum IV antibiotics [facility] was consulted for in-house management.  Significant Events: Admitted 12/10/2023 for diabetic hypoglycemia   Significant Labs: Labs showed WBC normal at 5.6, hemoglobin 10, platelet level low at 110, potassium low at 3.2 TSH low at 0.131, free T4 elevated to 2.31  Significant Imaging Studies: Chest x-ray showed right basilar opacity may be secondary to aspiration, pneumonia,or asymmetric  pulmonary edema.  Antibiotic Therapy: Anti-infectives (From admission, onward)    Start     Dose/Rate Route Frequency Ordered Stop   12/10/23 2000  Ampicillin-Sulbactam (UNASYN) 3 g in sodium chloride 0.9 % 100 mL IVPB        3 g 200 mL/hr over 30 Minutes Intravenous Every 6 hours 12/10/23 1948     12/10/23 1545  cefTRIAXone (ROCEPHIN) 1 g in sodium chloride 0.9 % 100 mL IVPB        1 g 200 mL/hr over 30 Minutes Intravenous  Once 12/10/23 1536 12/10/23 1732   12/10/23 1545  doxycycline (VIBRA-TABS) tablet 100 mg        100 mg Oral  Once 12/10/23 1536 12/10/23 1610       Procedures:   Consultants:

## 2023-12-11 NOTE — Discharge Instructions (Signed)

## 2023-12-11 NOTE — Assessment & Plan Note (Addendum)
12-11-2023 chronic. Pt has mild dysarthria

## 2023-12-11 NOTE — Assessment & Plan Note (Signed)
Continue eliquis bid.

## 2023-12-11 NOTE — Progress Notes (Signed)
PROGRESS NOTE    [name]  ZOX:[id number] DOB: 1951-07-07 DOA: 12/10/2023 PCP: Irven Coe, MD  Subjective: Pt seen and examined. Febrile to 100.9 this AM. Pt states her sister lives with her but she works.  Pt takes her insulin at home by herself. She does not eat breakfast in the AM, despite taking AM dosed insulin.  Pt feeling better. Chronically on 2L/min O2.   Hospital Course: HPI: [name] is a 72 y.o. female with PMH significant for DM2, HTN, HLD, CVA with left hemiparesis, wheelchair-bound status, paroxysmal A-fib on Eliquis, CHB s/p PPM, CHF on 2 L oxygen nasal cannula , hypothyroidism, thyroid cancer, Patient was brought to the ED today by EMS with concern of hypoglycemia. In the morning today, family found patient unresponsive, sitting on a wheelchair near the door.  EMS was called.  Noted blood sugar level low in 30s.  She was given D10 and glucagon with gradual improvement in mental status.  Brought to the ED. Of note, she was recently hospitalized 3 weeks ago for hypoglycemia and possible pneumonia   In the ED, she was hypothermic to 94.7, Bair hugger was started.  Heart rate in 50s.  Blood pressure was initially in 80s and later improved O2 sat was low in 80s and later improved on oxygen by nasal cannula. Patient's mental status gradually improved in the ED.  She reported she took her insulin in the morning but could not recall if she ate any meal. Labs showed WC count normal at 5.6, hemoglobin 10, platelet level low at 110, potassium low at 3.2 TSH low at 0.131, free T4 elevated to 2.31 Chest x-ray showed right basilar opacity may be secondary to aspiration, pneumonia, or asymmetric pulmonary edema. EKG shows normal sinus rhythm at 65 bpm, QTc 535 ms Given hypothermia, hypotension, hypoxia and infiltrates in chest x-ray, patient was suspected to have aspiration pneumonia while unresponsive.  She was started on broad-spectrum IV  antibiotics [facility] was consulted for in-house management.  Significant Events: Admitted 12/10/2023 for diabetic hypoglycemia   Significant Labs: Labs showed WBC normal at 5.6, hemoglobin 10, platelet level low at 110, potassium low at 3.2 TSH low at 0.131, free T4 elevated to 2.31  Significant Imaging Studies: Chest x-ray showed right basilar opacity may be secondary to aspiration, pneumonia, or asymmetric pulmonary edema.  Antibiotic Therapy: Anti-infectives (From admission, onward)    Start     Dose/Rate Route Frequency Ordered Stop   12/10/23 2000  Ampicillin-Sulbactam (UNASYN) 3 g in sodium chloride 0.9 % 100 mL IVPB        3 g 200 mL/hr over 30 Minutes Intravenous Every 6 hours 12/10/23 1948     12/10/23 1545  cefTRIAXone (ROCEPHIN) 1 g in sodium chloride 0.9 % 100 mL IVPB        1 g 200 mL/hr over 30 Minutes Intravenous  Once 12/10/23 1536 12/10/23 1732   12/10/23 1545  doxycycline (VIBRA-TABS) tablet 100 mg        100 mg Oral  Once 12/10/23 1536 12/10/23 1610       Procedures:   Consultants:     Assessment and Plan: * Diabetic hypoglycemia (HCC) 12-11-2023 due to pt taking in SQ insulin and not eating. No intention of self-harm.  Aspiration pneumonia (HCC) 12-11-2023 possibly pt aspirated yesterday during hypoglycemic event. Was febrile to 100.9 today. Change to po augmentin tomorrow. Pt on her baseline 2 L/min O2.  Obesity hypoventilation syndrome (HCC) 12-11-2023 chronic.  Chronic respiratory  failure with hypoxia, on home O2 therapy (HCC) - 2 L/min O2 12-11-2023 continue with home O2 @ 2 L/min  Essential hypertension 12-11-2023 BP low. Holding BP meds.  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 06/25/20 12-11-2023 chronic.  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 Pt has mild dysarthria  Chronic DOAC therapy Continue eliquis bid.  Dependent for  wheelchair mobility 12-11-2023 chronic.  Chronic diastolic CHF (congestive heart failure) (HCC) 12-11-2023 stable. 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 Restart tresiba   DVT prophylaxis:  apixaban (ELIQUIS) tablet 5 mg     Code Status: Full Code Family Communication: no family at bedside Disposition Plan: return home Reason for continuing need for hospitalization: continue with IV unasyn.  Objective: Vitals:   12/10/23 2131 12/10/23 2351 12/11/23 0511 12/11/23 0809  BP: 95/61 (!) 107/42 (!) 108/34 107/60  Pulse: (!) 104 66 69 61  Resp: 18 18 18 18   Temp: 99 F (37.2 C) 99.8 F (37.7 C) (!) 100.9 F (38.3 C) 98 F (36.7 C)  TempSrc: Oral Oral Oral   SpO2: 100% 100% 95% 98%  Weight:      Height:        Intake/Output Summary (Last 24 hours) at 12/11/2023 1353 Last data filed at 12/11/2023 0112 Gross per 24 hour  Intake 3 ml  Output --  Net 3 ml   Filed Weights   12/10/23 1337  Weight: 116.1 kg    Examination:  Physical Exam Vitals and nursing note reviewed.  Constitutional:      General: She is not in acute distress.    Appearance: She is obese. She is not toxic-appearing.  HENT:     Head: Normocephalic.     Nose: Nose normal.  Eyes:     General: No scleral icterus. Cardiovascular:     Rate and Rhythm: Normal rate and regular rhythm.  Pulmonary:     Effort: Pulmonary effort is normal.  Abdominal:     General: Abdomen is protuberant. Bowel sounds are normal.     Palpations: Abdomen is soft.  Skin:    General: Skin is warm and dry.  Neurological:     Mental Status: She is alert and oriented to person, place, and time.     Comments: Mild dysarthria    Data Reviewed: I have personally reviewed following labs and imaging studies  CBC: Recent Labs  Lab 12/10/23 1307 12/11/23 0618  WBC 5.6 4.2  NEUTROABS 4.7  --   HGB 10.0*  9.6*  HCT 33.7* 30.1*  MCV 97.7 93.8  PLT 110* 123*   Basic Metabolic Panel: Recent Labs  Lab 12/10/23 1307 12/11/23 0618  NA 139 137  K 3.2* 3.7  CL 104 104  CO2 27 27  GLUCOSE 111* 210*  BUN 14 14  CREATININE 0.88 1.08*  CALCIUM 8.7* 8.3*   GFR: Estimated Creatinine Clearance: 55.8 mL/min (A) (by C-G formula based on SCr of 1.08 mg/dL (H)).  BNP (last 3 results) Recent Labs    11/17/23 1103 12/10/23 1307  BNP 212.5* 146.6*   CBG: Recent Labs  Lab 12/10/23 2131 12/11/23 0102 12/11/23 0307 12/11/23 0808 12/11/23 1242  GLUCAP 233* 266* 222* 215* 241*   Thyroid Function Tests: Recent Labs    12/10/23 1307  TSH 0.139*  FREET4 2.31*   Sepsis Labs: Recent Labs  Lab  12/10/23 1734 12/10/23 2011  LATICACIDVEN 1.9 1.2    Recent Results (from the past 240 hours)  Blood culture (routine x 2)     Status: None (Preliminary result)   Collection Time: 12/10/23  5:23 PM   Specimen: BLOOD LEFT HAND  Result Value Ref Range Status   Specimen Description BLOOD LEFT HAND  Final   Special Requests   Final    BOTTLES DRAWN AEROBIC AND ANAEROBIC Blood Culture results may not be optimal due to an inadequate volume of blood received in culture bottles   Culture   Final    NO GROWTH < 24 HOURS Performed at [facility], [address], [place], [place] [address]    Report Status PENDING  Incomplete  Blood culture (routine x 2)     Status: None (Preliminary result)   Collection Time: 12/10/23  8:03 PM   Specimen: BLOOD RIGHT ARM  Result Value Ref Range Status   Specimen Description BLOOD RIGHT ARM  Final   Special Requests   Final    BOTTLES DRAWN AEROBIC AND ANAEROBIC Blood Culture adequate volume   Culture   Final    NO GROWTH < 12 HOURS Performed at [facility], [address] [place], [place] [address]    Report Status PENDING  Incomplete  Respiratory (~20 pathogens) panel by PCR     Status: None   Collection Time: 12/11/23  7:30 AM   Specimen:  Nasopharyngeal Swab; Respiratory  Result Value Ref Range Status   Adenovirus NOT DETECTED NOT DETECTED Final   Coronavirus 229E NOT DETECTED NOT DETECTED Final    Comment: (NOTE) The Coronavirus on the Respiratory Panel, DOES NOT test for the novel  Coronavirus (2019 nCoV)    Coronavirus HKU1 NOT DETECTED NOT DETECTED Final   Coronavirus NL63 NOT DETECTED NOT DETECTED Final   Coronavirus OC43 NOT DETECTED NOT DETECTED Final   Metapneumovirus NOT DETECTED NOT DETECTED Final   Rhinovirus / Enterovirus NOT DETECTED NOT DETECTED Final   Influenza A NOT DETECTED NOT DETECTED Final   Influenza B NOT DETECTED NOT DETECTED Final   Parainfluenza Virus 1 NOT DETECTED NOT DETECTED Final   Parainfluenza Virus 2 NOT DETECTED NOT DETECTED Final   Parainfluenza Virus 3 NOT DETECTED NOT DETECTED Final   Parainfluenza Virus 4 NOT DETECTED NOT DETECTED Final   Respiratory Syncytial Virus NOT DETECTED NOT DETECTED Final   Bordetella pertussis NOT DETECTED NOT DETECTED Final   Bordetella Parapertussis NOT DETECTED NOT DETECTED Final   Chlamydophila pneumoniae NOT DETECTED NOT DETECTED Final   Mycoplasma pneumoniae NOT DETECTED NOT DETECTED Final    Comment: Performed at [facility], [address] [place], [place] [address]  SARS Coronavirus 2 by RT PCR (hospital order, performed in [facility] hospital lab) *cepheid single result test* Anterior Nasal Swab     Status: None   Collection Time: 12/11/23  7:31 AM   Specimen: Anterior Nasal Swab  Result Value Ref Range Status   SARS Coronavirus 2 by RT PCR NEGATIVE NEGATIVE Final    Comment: Performed at [facility], [address] [place], [place] [address]     Radiology Studies: CT Head Wo Contrast Result Date: 12/10/2023 CLINICAL DATA:  Unresponsive, altered mental status EXAM: CT HEAD WITHOUT CONTRAST TECHNIQUE: Contiguous axial images were obtained from the base of the skull through the vertex without intravenous contrast. RADIATION  DOSE REDUCTION: This exam was performed according to the departmental dose-optimization program which includes  automated exposure control, adjustment of the mA and/or kV according to patient size and/or use of iterative reconstruction technique. COMPARISON:  11/17/2023 FINDINGS: Brain: No evidence of acute infarction, hemorrhage, mass, mass effect, or midline shift. No hydrocephalus or extra-axial fluid collection. The basilar cisterns are patent. Basal ganglia calcifications. Redemonstrated chronic ischemic changes in the pons. Vascular: No hyperdense vessel. Atherosclerotic calcifications in the intracranial carotid and vertebral arteries. Skull: Negative for fracture or focal lesion. Sinuses/Orbits: No acute finding. Status post bilateral lens replacements. Other: The mastoid air cells are well aerated. IMPRESSION: No acute intracranial process. Electronically Signed   By: Wiliam Ke M.D.   On: 12/10/2023 16:27   DG Chest Port 1 View Result Date: 12/10/2023 CLINICAL DATA:  aspiration evaluation. EXAM: PORTABLE CHEST 1 VIEW COMPARISON:  Chest radiograph dated November 18, 2023. FINDINGS: The heart size is enlarged. Stable left-sided dual lead pacemaker. Loop recorder device overlies the left lower chest wall. Mediastinal contours are unchanged. Aortic atherosclerosis. Bilateral interstitial prominence, most pronounced at the lung bases. Opacities at the right lung base with basilar atelectasis. Possible small right effusion. No pneumothorax identified. No acute osseous abnormality. IMPRESSION: 1. Right basilar opacity may be secondary to aspiration, pneumonia, or asymmetric pulmonary edema. 2. Cardiomegaly with pulmonary vascular congestion. 3. Possible small right effusion. Electronically Signed   By: Hart Robinsons M.D.   On: 12/10/2023 14:32    Scheduled Meds:  [START ON 12/12/2023] amoxicillin-clavulanate  1 tablet Oral Q12H   apixaban  5 mg Oral BID   atorvastatin  80 mg Oral QPM   gabapentin   300 mg Oral TID   insulin aspart  0-5 Units Subcutaneous QHS   insulin aspart  0-9 Units Subcutaneous TID WC   insulin degludec  10 Units Subcutaneous Q2200   levothyroxine  175 mcg Oral QAC breakfast   pantoprazole  40 mg Oral BID   sodium chloride flush  3 mL Intravenous Q12H   Continuous Infusions:  ampicillin-sulbactam (UNASYN) IV 3 g (12/11/23 1254)     LOS: 0 days   Time spent: 40 minutes  Carollee Herter, DO  [facility]  12/11/2023, 1:53 PM

## 2023-12-11 NOTE — Plan of Care (Signed)

## 2023-12-11 NOTE — Assessment & Plan Note (Signed)
12-11-2023 BP low. Holding BP meds.

## 2023-12-11 NOTE — Assessment & Plan Note (Signed)
12-11-2023 possibly pt aspirated yesterday during hypoglycemic event. Was febrile to 100.9 today. Change to po augmentin tomorrow. Pt on her baseline 2 L/min O2. 12-12-2023 DC to home on po augmentin. WBC 4.7. procal 0.69

## 2023-12-11 NOTE — Assessment & Plan Note (Signed)
BMI 48.36

## 2023-12-11 NOTE — Assessment & Plan Note (Signed)
12-11-2023 continue with home O2 @ 2 L/min

## 2023-12-12 ENCOUNTER — Other Ambulatory Visit (HOSPITAL_COMMUNITY): Payer: Self-pay

## 2023-12-12 DIAGNOSIS — J690 Pneumonitis due to inhalation of food and vomit: Secondary | ICD-10-CM | POA: Diagnosis not present

## 2023-12-12 DIAGNOSIS — E1042 Type 1 diabetes mellitus with diabetic polyneuropathy: Secondary | ICD-10-CM | POA: Diagnosis not present

## 2023-12-12 DIAGNOSIS — E11649 Type 2 diabetes mellitus with hypoglycemia without coma: Secondary | ICD-10-CM | POA: Diagnosis not present

## 2023-12-12 DIAGNOSIS — Z7901 Long term (current) use of anticoagulants: Secondary | ICD-10-CM

## 2023-12-12 DIAGNOSIS — J449 Chronic obstructive pulmonary disease, unspecified: Secondary | ICD-10-CM | POA: Diagnosis not present

## 2023-12-12 DIAGNOSIS — I5032 Chronic diastolic (congestive) heart failure: Secondary | ICD-10-CM | POA: Diagnosis not present

## 2023-12-12 DIAGNOSIS — D696 Thrombocytopenia, unspecified: Secondary | ICD-10-CM | POA: Insufficient documentation

## 2023-12-12 DIAGNOSIS — E890 Postprocedural hypothyroidism: Secondary | ICD-10-CM

## 2023-12-12 DIAGNOSIS — E109 Type 1 diabetes mellitus without complications: Secondary | ICD-10-CM | POA: Diagnosis not present

## 2023-12-12 DIAGNOSIS — I69354 Hemiplegia and hemiparesis following cerebral infarction affecting left non-dominant side: Secondary | ICD-10-CM | POA: Diagnosis not present

## 2023-12-12 LAB — CBC WITH DIFFERENTIAL/PLATELET
Abs Immature Granulocytes: 0.02 10*3/uL (ref 0.00–0.07)
Basophils Absolute: 0 10*3/uL (ref 0.0–0.1)
Basophils Relative: 1 %
Eosinophils Absolute: 0.1 10*3/uL (ref 0.0–0.5)
Eosinophils Relative: 2 %
HCT: 29.9 % — ABNORMAL LOW (ref 36.0–46.0)
Hemoglobin: 9.3 g/dL — ABNORMAL LOW (ref 12.0–15.0)
Immature Granulocytes: 0 %
Lymphocytes Relative: 35 %
Lymphs Abs: 1.7 10*3/uL (ref 0.7–4.0)
MCH: 29.5 pg (ref 26.0–34.0)
MCHC: 31.1 g/dL (ref 30.0–36.0)
MCV: 94.9 fL (ref 80.0–100.0)
Monocytes Absolute: 0.6 10*3/uL (ref 0.1–1.0)
Monocytes Relative: 13 %
Neutro Abs: 2.3 10*3/uL (ref 1.7–7.7)
Neutrophils Relative %: 49 %
Platelets: 122 10*3/uL — ABNORMAL LOW (ref 150–400)
RBC: 3.15 MIL/uL — ABNORMAL LOW (ref 3.87–5.11)
RDW: 13.1 % (ref 11.5–15.5)
WBC: 4.7 10*3/uL (ref 4.0–10.5)
nRBC: 0 % (ref 0.0–0.2)

## 2023-12-12 LAB — COMPREHENSIVE METABOLIC PANEL
ALT: 28 U/L (ref 0–44)
AST: 26 U/L (ref 15–41)
Albumin: 2.4 g/dL — ABNORMAL LOW (ref 3.5–5.0)
Alkaline Phosphatase: 61 U/L (ref 38–126)
Anion gap: 6 (ref 5–15)
BUN: 15 mg/dL (ref 8–23)
CO2: 30 mmol/L (ref 22–32)
Calcium: 8.5 mg/dL — ABNORMAL LOW (ref 8.9–10.3)
Chloride: 102 mmol/L (ref 98–111)
Creatinine, Ser: 1.13 mg/dL — ABNORMAL HIGH (ref 0.44–1.00)
GFR, Estimated: 52 mL/min — ABNORMAL LOW (ref >60)
Glucose, Bld: 326 mg/dL — ABNORMAL HIGH (ref 70–99)
Potassium: 3.9 mmol/L (ref 3.5–5.1)
Sodium: 138 mmol/L (ref 135–145)
Total Bilirubin: 0.8 mg/dL (ref <1.2)
Total Protein: 5.3 g/dL — ABNORMAL LOW (ref 6.5–8.1)

## 2023-12-12 LAB — PROCALCITONIN: Procalcitonin: 0.69 ng/mL

## 2023-12-12 LAB — MAGNESIUM: Magnesium: 1.9 mg/dL (ref 1.7–2.4)

## 2023-12-12 LAB — GLUCOSE, CAPILLARY: Glucose-Capillary: 311 mg/dL — ABNORMAL HIGH (ref 70–99)

## 2023-12-12 MED ORDER — AMOXICILLIN-POT CLAVULANATE 875-125 MG PO TABS
1.0000 | ORAL_TABLET | Freq: Two times a day (BID) | ORAL | 0 refills | Status: AC
  Filled 2023-12-12: qty 10, 5d supply, fill #0

## 2023-12-12 MED ORDER — LEVOTHYROXINE SODIUM 150 MCG PO TABS
150.0000 ug | ORAL_TABLET | Freq: Every day | ORAL | 0 refills | Status: AC
  Filled 2023-12-12: qty 60, 60d supply, fill #0

## 2023-12-12 NOTE — Discharge Summary (Addendum)
[facility] Phys[facility] Discharge Summary   Patient name: [name]  Admit date:     12/10/2023  Discharge date: 12/12/2023  Attending Physician: Imogene Burn, Roshan Roback [[id number]]  Discharge Physician: Carollee Herter   PCP: Irven Coe, MD  Admitted From: Home  Disposition:  Home  Recommendations for Outpatient Follow-up:  Follow up with PCP in 1-2 weeks Follow up with PCP regarding mildly decreased platelet count starting in November 2024.  Home Health:No Equipment/Devices: Oxygen 2 L/min: pt chronically on 2 L/min  Discharge Condition:Stable CODE STATUS:FULL Diet recommendation: Diabetic Fluid Restriction: None  Hospital Summary: HPI: [name] is a 72 y.o. female with PMH significant for DM2, HTN, HLD, CVA with left hemiparesis, wheelchair-bound status, paroxysmal A-fib on Eliquis, CHB s/p PPM, CHF on 2 L oxygen nasal cannula , hypothyroidism, thyroid cancer, Patient was brought to the ED today by EMS with concern of hypoglycemia. In the morning today, family found patient unresponsive, sitting on a wheelchair near the door.  EMS was called.  Noted blood sugar level low in 30s.  She was given D10 and glucagon with gradual improvement in mental status.  Brought to the ED. Of note, she was recently hospitalized 3 weeks ago for hypoglycemia and possible pneumonia   In the ED, she was hypothermic to 94.7, Bair hugger was started.  Heart rate in 50s.  Blood pressure was initially in 80s and later improved O2 sat was low in 80s and later improved on oxygen by nasal cannula. Patient's mental status gradually improved in the ED.  She reported she took her insulin in the morning but could not recall if she ate any meal. Labs showed WC count normal at 5.6, hemoglobin 10, platelet level low at 110, potassium low at 3.2 TSH low at 0.131, free T4 elevated to 2.31 Chest x-ray showed right basilar opacity may be secondary to aspiration, pneumonia, or asymmetric  pulmonary edema. EKG shows normal sinus rhythm at 65 bpm, QTc 535 ms Given hypothermia, hypotension, hypoxia and infiltrates in chest x-ray, patient was suspected to have aspiration pneumonia while unresponsive.  She was started on broad-spectrum IV antibiotics [facility] was consulted for in-house management.  Significant Events: Admitted 12/10/2023 for diabetic hypoglycemia   Significant Labs: Labs showed WBC normal at 5.6, hemoglobin 10, platelet level low at 110, potassium low at 3.2 TSH low at 0.131, free T4 elevated to 2.31  Significant Imaging Studies: Chest x-ray showed right basilar opacity may be secondary to aspiration, pneumonia,or asymmetric pulmonary edema.  Antibiotic Therapy: Anti-infectives (From admission, onward)    Start     Dose/Rate Route Frequency Ordered Stop   12/10/23 2000  Ampicillin-Sulbactam (UNASYN) 3 g in sodium chloride 0.9 % 100 mL IVPB        3 g 200 mL/hr over 30 Minutes Intravenous Every 6 hours 12/10/23 1948     12/10/23 1545  cefTRIAXone (ROCEPHIN) 1 g in sodium chloride 0.9 % 100 mL IVPB        1 g 200 mL/hr over 30 Minutes Intravenous  Once 12/10/23 1536 12/10/23 1732   12/10/23 1545  doxycycline (VIBRA-TABS) tablet 100 mg        100 mg Oral  Once 12/10/23 1536 12/10/23 1610       Procedures:   Consultants:    Hospital Course by Problem: * Diabetic hypoglycemia (HCC) 12-11-2023 due to pt taking in SQ insulin and not eating. No intention of self-harm. 12-12-2023 resolved. Reminded pt that she needs to eat food after taking  insulin at home.  Aspiration pneumonia (HCC) 12-11-2023 possibly pt aspirated yesterday during hypoglycemic event. Was febrile to 100.9 today. Change to po augmentin tomorrow. Pt on her baseline 2 L/min O2. 12-12-2023 DC to home on po augmentin. WBC 4.7. procal 0.69  Hypothyroidism, postsurgical 12-12-2023 TSH 0.139, Free T4 2.31(high). pt on 175 mcg daily of synthroid. Will reduce to 150 mcg daily. Pt will need PCP  f/u and repeat TSH, FT4 in 6 weeks.  Obesity hypoventilation syndrome (HCC) 12-11-2023 chronic.  Chronic respiratory failure with hypoxia, on home O2 therapy (HCC) - 2 L/min O2 12-11-2023 continue with home O2 @ 2 L/min  Essential hypertension 12-11-2023 BP low. Holding BP meds.  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 06/25/20 12-11-2023 chronic.  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 Pt has mild dysarthria  Chronic DOAC therapy Continue eliquis bid.  Dependent for wheelchair mobility 12-11-2023 chronic.  Chronic diastolic CHF (congestive heart failure) (HCC) 12-11-2023 stable. Euvolemic 12-12-2023 stable. Euvolemic. Will hold losartan and lasix until pt completed abx therapy for pneumonia.  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 Restart tresiba 12-12-2023 DC to home on same doses of insulins. Pt reminded to eat after taking insulin. This included bedtime snack.  Thrombocytopenia (HCC) 12-12-2023 presents on admission. unclear the cause of pt's thrombocytopenia. Has been present on labs since 11-23-204. Pt will need outpatient f/u with PCP for additional workup. She will be off abx by then.    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)   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 06/25/20   Essential  hypertension   Chronic respiratory failure with hypoxia, on home O2 therapy (HCC) - 2 L/min O2   Obesity hypoventilation syndrome (HCC)   Thrombocytopenia (HCC)   Discharge Instructions  Discharge Instructions     Call MD for:  extreme fatigue   Complete by: As directed    Call MD for:  hives   Complete by: As directed    Call MD for:  persistant dizziness or light-headedness   Complete by: As directed    Call MD for:  persistant nausea and vomiting   Complete by: As directed    Call MD for:  severe uncontrolled pain   Complete by: As directed    Call MD for:  temperature >100.4   Complete by: As directed    Diet Carb Modified   Complete by: As directed    Discharge instructions   Complete by: As directed    1. Follow up with primary care provider in 1-2 weeks following hospital discharge. 2. You must eat a meal/snack after every insulin injection.   Increase activity slowly   Complete by: As directed    No wound care   Complete by: As directed       Allergies as of 12/12/2023       Reactions   Latex Rash        Medication  List     PAUSE taking these medications    furosemide 40 MG tablet Wait to take this until: December 17, 2023 Commonly known as: LASIX Take 1 tablet (40 mg total) by mouth 2 (two) times daily.   losartan 25 MG tablet Wait to take this until: December 17, 2023 Commonly known as: COZAAR Take 1 tablet (25 mg total) by mouth daily.       TAKE these medications    acetaminophen 650 MG CR tablet Commonly known as: TYLENOL Take 650 mg by mouth daily as needed (severe pain).   albuterol 108 (90 Base) MCG/ACT inhaler Commonly known as: VENTOLIN HFA Inhale 2 puffs into the lungs every 6 (six) hours as needed for wheezing or shortness of breath.   amLODipine 2.5 MG tablet Commonly known as: NORVASC Take 2.5 mg by mouth daily.   amoxicillin-clavulanate 875-125 MG tablet Commonly known as: AUGMENTIN Take 1 tablet by mouth every 12  (twelve) hours for 5 days.   apixaban 5 MG Tabs tablet Commonly known as: Eliquis Take 1 tablet (5 mg total) by mouth 2 (two) times daily.   atorvastatin 80 MG tablet Commonly known as: LIPITOR TAKE 1 TABLET(80 MG) BY MOUTH EVERY EVENING What changed: See the new instructions.   b complex vitamins capsule Take 1 capsule by mouth daily.   busPIRone 5 MG tablet Commonly known as: BUSPAR TAKE 1 TABLET(5 MG) BY MOUTH THREE TIMES DAILY What changed: See the new instructions.   fluticasone 50 MCG/ACT nasal spray Commonly known as: FLONASE Place 1 spray into both nostrils daily.   gabapentin 300 MG capsule Commonly known as: NEURONTIN Take 1 capsule (300 mg total) by mouth as needed. What changed: when to take this   HumaLOG 100 UNIT/ML injection Generic drug: insulin lispro Inject 3-5 Units into the skin 3 (three) times daily with meals as needed for high blood sugar.   levothyroxine 150 MCG tablet Commonly known as: SYNTHROID Take 1 tablet (150 mcg total) by mouth daily before breakfast. What changed:  medication strength See the new instructions.   loratadine 10 MG tablet Commonly known as: CLARITIN Take 10 mg by mouth daily.   MAGNESIUM-POTASSIUM PO Take 1 each by mouth every evening. Potassium-magnesium combination gummy.   OXYGEN Inhale 2 L into the lungs continuous.   pantoprazole 40 MG tablet Commonly known as: PROTONIX Take 1 tablet (40 mg total) by mouth 2 (two) times daily.   polyethylene glycol powder 17 GM/SCOOP powder Commonly known as: GLYCOLAX/MIRALAX Take 17 g by mouth 2 (two) times daily as needed for moderate constipation. What changed: when to take this   [place] FlexTouch 100 UNIT/ML FlexTouch Pen Generic drug: insulin degludec Inject 20 Units into the skin daily.   VITAMIN D-3 PO Take 1 capsule by mouth daily.   Wixela Inhub 500-50 MCG/ACT Aepb Generic drug: fluticasone-salmeterol Inhale 1 puff into the lungs in the morning and at  bedtime.        Allergies  Allergen Reactions   Latex Rash    Discharge Exam: Vitals:   12/12/23 0446 12/12/23 0731  BP: (!) 124/46 (!) 123/37  Pulse: (!) 54 (!) 55  Resp: 18 16  Temp: 99.6 F (37.6 C) 98.2 F (36.8 C)  SpO2: 97% 97%    Physical Exam Vitals and nursing note reviewed.  Constitutional:      General: She is not in acute distress.    Appearance: She is obese. She is not toxic-appearing.  HENT:     Head:  Normocephalic and atraumatic.  Cardiovascular:     Rate and Rhythm: Normal rate and regular rhythm.  Pulmonary:     Effort: Pulmonary effort is normal.     Breath sounds: Normal breath sounds.  Abdominal:     General: Bowel sounds are normal.     Palpations: Abdomen is soft.  Skin:    Capillary Refill: Capillary refill takes less than 2 seconds.  Neurological:     Mental Status: She is alert and oriented to person, place, and time.     Comments: Similar dysarthria. No change.     The results of significant diagnostics from this hospitalization (including imaging, microbiology, ancillary and laboratory) are listed below for reference.    Microbiology: Recent Results (from the past 240 hours)  Blood culture (routine x 2)     Status: None (Preliminary result)   Collection Time: 12/10/23  5:23 PM   Specimen: BLOOD LEFT HAND  Result Value Ref Range Status   Specimen Description BLOOD LEFT HAND  Final   Special Requests   Final    BOTTLES DRAWN AEROBIC AND ANAEROBIC Blood Culture results may not be optimal due to an inadequate volume of blood received in culture bottles   Culture   Final    NO GROWTH 2 DAYS Performed at [facility], [address], [place], [place] [address]    Report Status PENDING  Incomplete  Blood culture (routine x 2)     Status: None (Preliminary result)   Collection Time: 12/10/23  8:03 PM   Specimen: BLOOD RIGHT ARM  Result Value Ref Range Status   Specimen Description BLOOD RIGHT ARM  Final   Special Requests    Final    BOTTLES DRAWN AEROBIC AND ANAEROBIC Blood Culture adequate volume   Culture   Final    NO GROWTH 2 DAYS Performed at [facility], [address] [place], [place] [address]    Report Status PENDING  Incomplete  Respiratory (~20 pathogens) panel by PCR     Status: None   Collection Time: 12/11/23  7:30 AM   Specimen: Nasopharyngeal Swab; Respiratory  Result Value Ref Range Status   Adenovirus NOT DETECTED NOT DETECTED Final   Coronavirus 229E NOT DETECTED NOT DETECTED Final    Comment: (NOTE) The Coronavirus on the Respiratory Panel, DOES NOT test for the novel  Coronavirus (2019 nCoV)    Coronavirus HKU1 NOT DETECTED NOT DETECTED Final   Coronavirus NL63 NOT DETECTED NOT DETECTED Final   Coronavirus OC43 NOT DETECTED NOT DETECTED Final   Metapneumovirus NOT DETECTED NOT DETECTED Final   Rhinovirus / Enterovirus NOT DETECTED NOT DETECTED Final   Influenza A NOT DETECTED NOT DETECTED Final   Influenza B NOT DETECTED NOT DETECTED Final   Parainfluenza Virus 1 NOT DETECTED NOT DETECTED Final   Parainfluenza Virus 2 NOT DETECTED NOT DETECTED Final   Parainfluenza Virus 3 NOT DETECTED NOT DETECTED Final   Parainfluenza Virus 4 NOT DETECTED NOT DETECTED Final   Respiratory Syncytial Virus NOT DETECTED NOT DETECTED Final   Bordetella pertussis NOT DETECTED NOT DETECTED Final   Bordetella Parapertussis NOT DETECTED NOT DETECTED Final   Chlamydophila pneumoniae NOT DETECTED NOT DETECTED Final   Mycoplasma pneumoniae NOT DETECTED NOT DETECTED Final    Comment: Performed at [facility], [address], [place], [place] [address]  SARS Coronavirus 2 by RT PCR (hospital order, performed in [facility] hospital lab) *cepheid single result test* Anterior Nasal Swab  Status: None   Collection Time: 12/11/23  7:31 AM   Specimen: Anterior Nasal Swab  Result Value Ref Range Status   SARS Coronavirus 2 by RT PCR NEGATIVE NEGATIVE Final    Comment: Performed at [facility], [address] [place], [place] [address]     Labs: BNP (last 3 results) Recent Labs    11/17/23 1103 12/10/23 1307  BNP 212.5* 146.6*   Basic Metabolic Panel: Recent Labs  Lab 12/10/23 1307 12/11/23 0618 12/12/23 0620  NA 139 137 138  K 3.2* 3.7 3.9  CL 104 104 102  CO2 27 27 30   GLUCOSE 111* 210* 326*  BUN 14 14 15   CREATININE 0.88 1.08* 1.13*  CALCIUM 8.7* 8.3* 8.5*  MG  --   --  1.9   Liver Function Tests: Recent Labs  Lab 12/12/23 0620  AST 26  ALT 28  ALKPHOS 61  BILITOT 0.8  PROT 5.3*  ALBUMIN 2.4*   CBC: Recent Labs  Lab 12/10/23 1307 12/11/23 0618 12/12/23 0620  WBC 5.6 4.2 4.7  NEUTROABS 4.7  --  2.3  HGB 10.0* 9.6* 9.3*  HCT 33.7* 30.1* 29.9*  MCV 97.7 93.8 94.9  PLT 110* 123* 122*   BNP: Recent Labs  Lab 12/10/23 1307  BNP 146.6*   CBG: Recent Labs  Lab 12/11/23 1242 12/11/23 1729 12/11/23 1923 12/11/23 2020 12/12/23 0734  GLUCAP 241* 159* 171* 196* 311*   Thyroid function studies Recent Labs    12/10/23 1307  TSH 0.139*   Lab Results  Component Value Date/Time   FREET4 2.31 (H) 12/10/2023 01:07 PM   Urinalysis    Component Value Date/Time   COLORURINE YELLOW 12/11/2023 1127   APPEARANCEUR HAZY (A) 12/11/2023 1127   LABSPEC 1.023 12/11/2023 1127   PHURINE 5.0 12/11/2023 1127   GLUCOSEU NEGATIVE 12/11/2023 1127   HGBUR NEGATIVE 12/11/2023 1127   BILIRUBINUR NEGATIVE 12/11/2023 1127   BILIRUBINUR negative 10/29/2018 1430   BILIRUBINUR Negative 11/24/2016 0922   KETONESUR NEGATIVE 12/11/2023 1127   PROTEINUR NEGATIVE 12/11/2023 1127   UROBILINOGEN 0.2 10/29/2018 1430   UROBILINOGEN 0.2 08/29/2015 0947   NITRITE NEGATIVE 12/11/2023 1127   LEUKOCYTESUR NEGATIVE 12/11/2023 1127   Sepsis Labs Recent Labs  Lab 12/10/23 1307 12/11/23 0618 12/12/23 0620  WBC 5.6 4.2 4.7   Microbiology Recent Results (from the past 240 hours)  Blood culture (routine x 2)     Status: None (Preliminary result)    Collection Time: 12/10/23  5:23 PM   Specimen: BLOOD LEFT HAND  Result Value Ref Range Status   Specimen Description BLOOD LEFT HAND  Final   Special Requests   Final    BOTTLES DRAWN AEROBIC AND ANAEROBIC Blood Culture results may not be optimal due to an inadequate volume of blood received in culture bottles   Culture   Final    NO GROWTH 2 DAYS Performed at [facility], [address], [place], [place] [address]    Report Status PENDING  Incomplete  Blood culture (routine x 2)     Status: None (Preliminary result)   Collection Time: 12/10/23  8:03 PM   Specimen: BLOOD RIGHT ARM  Result Value Ref Range Status   Specimen Description BLOOD RIGHT ARM  Final   Special Requests   Final    BOTTLES DRAWN AEROBIC AND ANAEROBIC Blood Culture adequate volume   Culture   Final    NO GROWTH 2 DAYS Performed at [facility], [address] [place], [place] [address]    Report Status PENDING  Incomplete  Respiratory (~20 pathogens) panel by PCR     Status: None   Collection Time: 12/11/23  7:30 AM   Specimen: Nasopharyngeal Swab; Respiratory  Result Value Ref Range Status   Adenovirus NOT DETECTED NOT DETECTED Final   Coronavirus 229E NOT DETECTED NOT DETECTED Final    Comment: (NOTE) The Coronavirus on the Respiratory Panel, DOES NOT test for the novel  Coronavirus (2019 nCoV)    Coronavirus HKU1 NOT DETECTED NOT DETECTED Final   Coronavirus NL63 NOT DETECTED NOT DETECTED Final   Coronavirus OC43 NOT DETECTED NOT DETECTED Final   Metapneumovirus NOT DETECTED NOT DETECTED Final   Rhinovirus / Enterovirus NOT DETECTED NOT DETECTED Final   Influenza A NOT DETECTED NOT DETECTED Final   Influenza B NOT DETECTED NOT DETECTED Final   Parainfluenza Virus 1 NOT DETECTED NOT DETECTED Final   Parainfluenza Virus 2 NOT DETECTED NOT DETECTED Final   Parainfluenza Virus 3 NOT DETECTED NOT DETECTED Final   Parainfluenza Virus 4 NOT DETECTED NOT DETECTED Final   Respiratory Syncytial  Virus NOT DETECTED NOT DETECTED Final   Bordetella pertussis NOT DETECTED NOT DETECTED Final   Bordetella Parapertussis NOT DETECTED NOT DETECTED Final   Chlamydophila pneumoniae NOT DETECTED NOT DETECTED Final   Mycoplasma pneumoniae NOT DETECTED NOT DETECTED Final    Comment: Performed at [facility], [address], [place], [place] [address]  SARS Coronavirus 2 by RT PCR (hospital order, performed in [facility] hospital lab) *cepheid single result test* Anterior Nasal Swab     Status: None   Collection Time: 12/11/23  7:31 AM   Specimen: Anterior Nasal Swab  Result Value Ref Range Status   SARS Coronavirus 2 by RT PCR NEGATIVE NEGATIVE Final    Comment: Performed at [facility], [address], [place], [place] [address]    Procedures/Studies: CT Head Wo Contrast Result Date: 12/10/2023 CLINICAL DATA:  Unresponsive, altered mental status EXAM: CT HEAD WITHOUT CONTRAST TECHNIQUE: Contiguous axial images were obtained from the base of the skull through the vertex without intravenous contrast. RADIATION DOSE REDUCTION: This exam was performed according to the departmental dose-optimization program which includes automated exposure control, adjustment of the mA and/or kV according to patient size and/or use of iterative reconstruction technique. COMPARISON:  11/17/2023 FINDINGS: Brain: No evidence of acute infarction, hemorrhage, mass, mass effect, or midline shift. No hydrocephalus or extra-axial fluid collection. The basilar cisterns are patent. Basal ganglia calcifications. Redemonstrated chronic ischemic changes in the pons. Vascular: No hyperdense vessel. Atherosclerotic calcifications in the intracranial carotid and vertebral arteries. Skull: Negative for fracture or focal lesion. Sinuses/Orbits: No acute finding. Status post bilateral lens replacements. Other: The mastoid air cells are well aerated. IMPRESSION: No acute intracranial process. Electronically Signed   By: Wiliam Ke M.D.   On: 12/10/2023 16:27   DG Chest Port 1 View Result Date: 12/10/2023 CLINICAL DATA:  aspiration evaluation. EXAM: PORTABLE CHEST 1 VIEW COMPARISON:  Chest radiograph dated November 18, 2023. FINDINGS: The heart size is enlarged. Stable left-sided dual lead pacemaker. Loop recorder device overlies the left lower chest wall. Mediastinal contours are unchanged. Aortic atherosclerosis. Bilateral interstitial prominence, most pronounced at the lung bases. Opacities at the right lung base with basilar atelectasis. Possible small right effusion. No pneumothorax identified. No acute osseous abnormality. IMPRESSION: 1. Right basilar opacity may be secondary to aspiration, pneumonia, or asymmetric pulmonary edema. 2. Cardiomegaly with  pulmonary vascular congestion. 3. Possible small right effusion. Electronically Signed   By: Hart Robinsons M.D.   On: 12/10/2023 14:32   DG Swallowing Func-Speech Pathology Result Date: 11/19/2023 Table formatting from the original result was not included. Modified Barium Swallow Study Patient Details Name: Roen Crush MRN: [id number] Date of Birth: 30-May-1951 Today's Date: 11/19/2023 HPI/PMH: HPI: Feliciana Rolfson is a 72 yo female presenting to ED 11/23 with hypoglycemia, AMS, and worsening hypoxia. CTH negative for acute abnormalities. Most recently seen by SLP 01/2022 and recommended Dys 2 diet with thin liquids with known risk of aspiration after declining to use thickened liquids. MBS 11/2021 with aspiration of thin liquids due to mistiming and was recommended Dys 3 solids with nectar thick liquids. recommended PMH includes HTN, HLD, GERD, T1DM, A-fib, complete heart block s/p pacemaker, hypothyroidism, CVA with residual L hemiparesis and wheelchair dependence, MCI, depression, OSA, diastolic CHF, obesity, lower back pain, chronic hypoxia requiring 1L O2 at baseline Clinical Impression: Clinical Impression: Pt has had a decline in swallowing function  since last MBS, with increased volume and frequency of aspiration and subjective c/o trouble with solids. Her primary deficits continue to be with mistiming. She has incomplete laryngeal vestibule closure, laryngeal elevation, and hyoid excursion. Thin and nectar thick liquids spill into the airway prior to initiation of the swallow and before any attempts at closing the airway. Aspiration is mostly sensed via cough or pt report of "burning" but her cough does not clear the aspirates (PAS 7). Strategies that were found to be helpful in previous MBS are no longer effective. Pt had no penetration or aspiration with honey thick liquids or solids though, as they are better contained within the valleculae. Other than mastication that is slow, with pt taking rest breaks, and slow posterior transit with solids, her oral phase is grossly functional with solids. Pt reports feeling like she can't swallow solids though (reporting that they get stuck) even on current mechanical soft diet. Unable to perform esophageal sweep due to body habitus, but question if this could be a primary esophageal issue. She prefers to try Dys 2 (finely chopped) diet and is agreeable to trying honey thick liquids. Factors that may increase risk of adverse event in presence of aspiration Rubye Oaks & Clearance Coots 2021): Factors that may increase risk of adverse event in presence of aspiration Rubye Oaks & Clearance Coots 2021): Respiratory or GI disease; Weak cough; Aspiration of thick, dense, and/or acidic materials; Frequent aspiration of large volumes Recommendations/Plan: Swallowing Evaluation Recommendations Swallowing Evaluation Recommendations Recommendations: PO diet PO Diet Recommendation: Dysphagia 2 (Finely chopped); Moderately thick liquids (Level 3, honey thick) Liquid Administration via: Cup; Straw Medication Administration: Whole meds with liquid (honey thick) Supervision: Patient able to self-feed; Set-up assistance for safety; Intermittent  supervision/cueing for swallowing strategies Swallowing strategies  : Slow rate; Small bites/sips; Follow solids with liquids Postural changes: Position pt fully upright for meals; Stay upright 30-60 min after meals Oral care recommendations: Oral care BID (2x/day) Caregiver Recommendations: Avoid jello, ice cream, thin soups, popsicles; Remove water pitcher Treatment Plan Treatment Plan Treatment recommendations: Therapy as outlined in treatment plan below Follow-up recommendations: Home health SLP Functional status assessment: Patient has had a recent decline in their functional status and demonstrates the ability to make significant improvements in function in a reasonable and predictable amount of time. Treatment frequency: Min 2x/week Treatment duration: 2 weeks Interventions: Aspiration precaution training; Compensatory techniques; Patient/family education; Trials of upgraded texture/liquids; Diet toleration management by SLP; Respiratory muscle  strength training Recommendations Recommendations for follow up therapy are one component of a multi-disciplinary discharge planning process, led by the attending physician.  Recommendations may be updated based on patient status, additional functional criteria and insurance authorization. Assessment: Orofacial Exam: Orofacial Exam Oral Cavity - Dentition: Dentures, top; Dentures, bottom Anatomy: Anatomy: WFL Boluses Administered: Boluses Administered Boluses Administered: Thin liquids (Level 0); Mildly thick liquids (Level 2, nectar thick); Moderately thick liquids (Level 3, honey thick); Puree; Solid  Oral Impairment Domain: Oral Impairment Domain Lip Closure: -- (not able to observe on fluoro due to positioning but no overt loss noted outwardly) Tongue control during bolus hold: Cohesive bolus between tongue to palatal seal Bolus preparation/mastication: Slow prolonged chewing/mashing with complete recollection Bolus transport/lingual motion: Slow tongue motion Oral  residue: Residue collection on oral structures Location of oral residue : Tongue Initiation of pharyngeal swallow : Posterior laryngeal surface of the epiglottis (trachea)  Pharyngeal Impairment Domain: Pharyngeal Impairment Domain Soft palate elevation: No bolus between soft palate (SP)/pharyngeal wall (PW) Laryngeal elevation: Partial superior movement of thyroid cartilage/partial approximation of arytenoids to epiglottic petiole Anterior hyoid excursion: Partial anterior movement Epiglottic movement: Complete inversion Laryngeal vestibule closure: Incomplete, narrow column air/contrast in laryngeal vestibule Pharyngeal stripping wave : Present - complete Pharyngeal contraction (A/P view only): N/A Pharyngoesophageal segment opening: Complete distension and complete duration, no obstruction of flow Tongue base retraction: No contrast between tongue base and posterior pharyngeal wall (PPW) Pharyngeal residue: Complete pharyngeal clearance Location of pharyngeal residue: N/A  Esophageal Impairment Domain: Esophageal Impairment Domain Esophageal clearance upright position: -- (unable to scan lower due to body habitus) Pill: Pill Consistency administered: Moderately thick liquids (Level 3, honey thick) Moderately thick liquids (Level 3, honey thick): WFL Penetration/Aspiration Scale Score: Penetration/Aspiration Scale Score 1.  Material does not enter airway: Moderately thick liquids (Level 3, honey thick); Puree; Solid; Pill 7.  Material enters airway, passes BELOW cords and not ejected out despite cough attempt by patient: Thin liquids (Level 0); Mildly thick liquids (Level 2, nectar thick) Compensatory Strategies: Compensatory Strategies Compensatory strategies: Yes Straw: Ineffective; Effective Effective Straw: Moderately thick liquid (Level 3, honey thick) Ineffective Straw: Mildly thick liquid (Level 2, nectar thick) Oral bolus hold: Ineffective Ineffective Oral Bolus Hold : Thin liquid (Level 0); Mildly thick  liquid (Level 2, nectar thick)   General Information: Caregiver present: No  Diet Prior to this Study: Dysphagia 3 (mechanical soft); Mildly thick liquids (Level 2, nectar thick)   Temperature : Normal   Respiratory Status: WFL   Supplemental O2: Nasal cannula   History of Recent Intubation: No  Behavior/Cognition: Alert; Cooperative; Pleasant mood Self-Feeding Abilities: Able to self-feed Baseline vocal quality/speech: Normal Volitional Cough: Able to elicit Volitional Swallow: Able to elicit Exam Limitations: No limitations Goal Planning: Prognosis for improved oropharyngeal function: Good Barriers to Reach Goals: Time post onset No data recorded Patient/Family Stated Goal: wants to feel better Consulted and agree with results and recommendations: Patient Pain: Pain Assessment Pain Assessment: Faces Faces Pain Scale: 0 End of Session: Start Time:SLP Start Time (ACUTE ONLY): 0820 Stop Time: SLP Stop Time (ACUTE ONLY): 0844 Time Calculation:SLP Time Calculation (min) (ACUTE ONLY): 24 min Charges: SLP Evaluations $ SLP Speech Visit: 1 Visit SLP Evaluations $BSS Swallow: 1 Procedure $MBS Swallow: 1 Procedure SLP visit diagnosis: SLP Visit Diagnosis: Dysphagia, oropharyngeal phase (R13.12) Past Medical History: Past Medical History: Diagnosis Date  Abnormal echocardiogram   Anemia   Arthritis   BPPV (benign paroxysmal positional vertigo), right 06/13/2017  Bradycardia 03/07/2020  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  CHB (complete heart block) (HCC) 03/08/2020  CHF (congestive heart failure) (HCC)   Closed fracture of left proximal humerus 07/08/2019  Decreased range of motion of left shoulder 09/03/2020  Depression   Dizzy 03/07/2020  bradycardia  Edentulous 09/03/2020  GERD (gastroesophageal reflux disease)   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  Severe chronic microvascular ischemic disease of the pons, left middle cerebellar peduncle, and mini remote lacunar infarcts, progressive since 2019.  Remote pontine microhemorrhage most likely of hypertensive etiology.  10/10/21 CT reveals chronic atrophic changes.    Malignant tumor of thyroid gland (HCC) 03/27/2022  Near syncope 03/08/2020  Paroxysmal atrial fibrillation (HCC)   Presence of permanent cardiac pacemaker   Proteinuria 03/08/2020  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 06/25/20 06/26/2020  Seasonal allergies   Stroke (HCC)   Suicidal ideation 11/16/2021  Thyroid cancer (HCC)   per pt S/p Total Thyroidectomy with Radioactive Iodine Therapy  Type 1 diabetes mellitus (HCC)  Past Surgical History: Past Surgical History: Procedure Laterality Date  ABDOMINAL ANGIOGRAM N/A 05/14/2012  Procedure: ABDOMINAL ANGIOGRAM;  Surgeon: [name], MD;  Location: [facility] CATH LAB;  Service: Cardiovascular;  Laterality: N/A;  ABDOMINAL HYSTERECTOMY  1995  partial  ANKLE FRACTURE SURGERY Right   CARDIAC CATHETERIZATION    with coronary angiogram  cataract  Bilateral 2018  COLONOSCOPY N/A 09/09/2014  Procedure: COLONOSCOPY;  Surgeon: Iva Boop, MD;  Location: [facility] ENDOSCOPY;  Service: Endoscopy;  Laterality: N/A;  ESOPHAGOGASTRODUODENOSCOPY (EGD) WITH PROPOFOL N/A 12/03/2018  Procedure: ESOPHAGOGASTRODUODENOSCOPY (EGD) WITH PROPOFOL;  Surgeon: Sherrilyn Rist, MD;  Location: [facility] ENDOSCOPY;  Service: Gastroenterology;  Laterality: N/A;  LEFT AND RIGHT HEART CATHETERIZATION WITH CORONARY ANGIOGRAM N/A 05/14/2012  Procedure: LEFT AND RIGHT  HEART CATHETERIZATION WITH CORONARY ANGIOGRAM;  Surgeon: [name], MD;  Location: [facility] CATH LAB;  Service: Cardiovascular;  Laterality: N/A;  LOOP RECORDER INSERTION N/A 12/23/2018  Procedure: LOOP RECORDER INSERTION;  Surgeon: Regan Lemming, MD;  Location: [facility] INVASIVE CV LAB;  Service: Cardiovascular;  Laterality: N/A;  LOOP RECORDER REMOVAL N/A 06/25/2020  Procedure: LOOP RECORDER REMOVAL;  Surgeon: Regan Lemming, MD;  Location: [facility] INVASIVE CV LAB;  Service: Cardiovascular;  Laterality: N/A;  MINI METER   09/08/2014  ELECTRONIC INSULIN PUMP  PACEMAKER IMPLANT N/A 06/25/2020  Procedure: PACEMAKER IMPLANT;  Surgeon: Regan Lemming, MD;  Location: [facility] INVASIVE CV LAB;  Service: Cardiovascular;  Laterality: N/A;  TEE WITHOUT CARDIOVERSION N/A 10/16/2018  Procedure: TRANSESOPHAGEAL ECHOCARDIOGRAM (TEE);  Surgeon: Jodelle Red, MD;  Location: [facility] ENDOSCOPY;  Service: Cardiovascular;  Laterality: N/A;  THYROIDECTOMY  2006  for thyroid cancer Mahala Menghini., M.A. CCC-SLP Acute Rehabilitation Services Office [phone] Secure chat preferred 11/19/2023, 10:38 AM  DG CHEST PORT 1 VIEW Result Date: 11/18/2023 CLINICAL DATA:  Cough hemoptysis EXAM: PORTABLE CHEST 1 VIEW COMPARISON:  11/17/2023, 09/10/2023 FINDINGS: Similar appearance of left-sided pacing device. Electronic recording device over left lower chest. Cardiomegaly with vascular congestion and pulmonary edema,  slightly increased compared to prior. Suspicion of trace right pleural effusion. Aortic atherosclerosis. No pneumothorax IMPRESSION: Cardiomegaly with vascular congestion and pulmonary edema, slightly increased compared to prior. Suspicion of trace right pleural effusion. Electronically Signed   By: Jasmine Pang M.D.   On: 11/18/2023 16:24   CT Head Wo Contrast Result Date: 11/17/2023 CLINICAL DATA:  Mental status change of unknown cause. Hypoglycemia. Diabetic. EXAM: CT HEAD WITHOUT CONTRAST TECHNIQUE: Contiguous axial  images were obtained from the base of the skull through the vertex without intravenous contrast. RADIATION DOSE REDUCTION: This exam was performed according to the departmental dose-optimization program which includes automated exposure control, adjustment of the mA and/or kV according to patient size and/or use of iterative reconstruction technique. COMPARISON:  10/31/2021 CT.  08/05/2021 MRI. FINDINGS: Brain: Advanced chronic ischemic changes of the brainstem. No acute posterior fossa insult identified. Cerebral hemispheres show mild chronic small-vessel ischemic changes of the white matter. No sign of acute stroke, mass, hemorrhage, hydrocephalus or extra-axial collection. Vascular: There is atherosclerotic calcification of the major vessels at the base of the brain. Skull: Negative Sinuses/Orbits: Clear/normal Other: None IMPRESSION: No acute CT finding. Advanced chronic ischemic changes of the brainstem. Mild chronic small-vessel ischemic changes of the cerebral hemispheric white matter. Electronically Signed   By: Paulina Fusi M.D.   On: 11/17/2023 13:02   DG Chest Port 1 View Result Date: 11/17/2023 CLINICAL DATA:  Questionable sepsis - evaluate for abnormality. EXAM: PORTABLE CHEST 1 VIEW COMPARISON:  09/10/2023. FINDINGS: Low lung volume. There is diffuse pulmonary vascular congestion. There are probable atelectatic changes at the lung bases. No acute consolidation or lung collapse. Bilateral lateral costophrenic angles are clear. Stable mildly enlarged cardio-mediastinal silhouette. There is a left sided 2-lead pacemaker. Presumed loop recorder device noted overlying the left lower chest wall. No acute osseous abnormalities. The soft tissues are within normal limits. IMPRESSION: *Probable mild congestive heart failure/pulmonary edema. Electronically Signed   By: Jules Schick M.D.   On: 11/17/2023 11:30    Time coordinating discharge: 45 mins  SIGNED:  Carollee Herter, DO [facility] 12/12/23, 10:16 AM

## 2023-12-12 NOTE — Assessment & Plan Note (Addendum)
12-12-2023 presents on admission. unclear the cause of pt's thrombocytopenia. Has been present on labs since 11-23-204. Pt will need outpatient f/u with PCP for additional workup. She will be off abx by then.

## 2023-12-12 NOTE — Progress Notes (Addendum)
PROGRESS NOTE    [name]  KGM:[id number] DOB: 11-28-1951 DOA: 12/10/2023 PCP: Irven Coe, MD  Subjective: Pt seen and examined.  Afebrile now. Hypoglycemia has resolved. Pt stable for DC. Pt states she has portable home O2 and she can arrange for home transport today.   Hospital Course: HPI: [name] is a 72 y.o. female with PMH significant for DM2, HTN, HLD, CVA with left hemiparesis, wheelchair-bound status, paroxysmal A-fib on Eliquis, CHB s/p PPM, CHF on 2 L oxygen nasal cannula , hypothyroidism, thyroid cancer, Patient was brought to the ED today by EMS with concern of hypoglycemia. In the morning today, family found patient unresponsive, sitting on a wheelchair near the door.  EMS was called.  Noted blood sugar level low in 30s.  She was given D10 and glucagon with gradual improvement in mental status.  Brought to the ED. Of note, she was recently hospitalized 3 weeks ago for hypoglycemia and possible pneumonia   In the ED, she was hypothermic to 94.7, Bair hugger was started.  Heart rate in 50s.  Blood pressure was initially in 80s and later improved O2 sat was low in 80s and later improved on oxygen by nasal cannula. Patient's mental status gradually improved in the ED.  She reported she took her insulin in the morning but could not recall if she ate any meal. Labs showed WC count normal at 5.6, hemoglobin 10, platelet level low at 110, potassium low at 3.2 TSH low at 0.131, free T4 elevated to 2.31 Chest x-ray showed right basilar opacity may be secondary to aspiration, pneumonia, or asymmetric pulmonary edema. EKG shows normal sinus rhythm at 65 bpm, QTc 535 ms Given hypothermia, hypotension, hypoxia and infiltrates in chest x-ray, patient was suspected to have aspiration pneumonia while unresponsive.  She was started on broad-spectrum IV antibiotics [facility] was consulted for in-house management.  Significant Events: Admitted 12/10/2023 for  diabetic hypoglycemia   Significant Labs: Labs showed WBC normal at 5.6, hemoglobin 10, platelet level low at 110, potassium low at 3.2 TSH low at 0.131, free T4 elevated to 2.31  Significant Imaging Studies: Chest x-ray showed right basilar opacity may be secondary to aspiration, pneumonia,or asymmetric pulmonary edema.  Antibiotic Therapy: Anti-infectives (From admission, onward)    Start     Dose/Rate Route Frequency Ordered Stop   12/10/23 2000  Ampicillin-Sulbactam (UNASYN) 3 g in sodium chloride 0.9 % 100 mL IVPB        3 g 200 mL/hr over 30 Minutes Intravenous Every 6 hours 12/10/23 1948     12/10/23 1545  cefTRIAXone (ROCEPHIN) 1 g in sodium chloride 0.9 % 100 mL IVPB        1 g 200 mL/hr over 30 Minutes Intravenous  Once 12/10/23 1536 12/10/23 1732   12/10/23 1545  doxycycline (VIBRA-TABS) tablet 100 mg        100 mg Oral  Once 12/10/23 1536 12/10/23 1610       Procedures:   Consultants:     Assessment and Plan: * Diabetic hypoglycemia (HCC) 12-11-2023 due to pt taking in SQ insulin and not eating. No intention of self-harm. 12-12-2023 resolved. Reminded pt that she needs to eat food after taking insulin at home.  Aspiration pneumonia (HCC) 12-11-2023 possibly pt aspirated yesterday during hypoglycemic event. Was febrile to 100.9 today. Change to po augmentin tomorrow. Pt on her baseline 2 L/min O2. 12-12-2023 DC to home on po augmentin. WBC 4.7. procal 0.69  Hypothyroidism, postsurgical 12-12-2023 TSH  0.139, Free T4 2.31(high). pt on 175 mcg daily of synthroid. Will reduce to 150 mcg daily. Pt will need PCP f/u and repeat TSH, FT4 in 6 weeks.  Obesity hypoventilation syndrome (HCC) 12-11-2023 chronic.  Chronic respiratory failure with hypoxia, on home O2 therapy (HCC) - 2 L/min O2 12-11-2023 continue with home O2 @ 2 L/min  Essential hypertension 12-11-2023 BP low. Holding BP meds.  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  06/25/20 12-11-2023 chronic.  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 Pt has mild dysarthria  Chronic DOAC therapy Continue eliquis bid.  Dependent for wheelchair mobility 12-11-2023 chronic.  Chronic diastolic CHF (congestive heart failure) (HCC) 12-11-2023 stable. Euvolemic 12-12-2023 stable. Euvolemic. Will hold losartan and lasix until pt completed abx therapy for pneumonia.  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 Restart tresiba 12-12-2023 DC to home on same doses of insulins. Pt reminded to eat after taking insulin. This included bedtime snack.  Thrombocytopenia (HCC) 12-12-2023 presents on admission. unclear the cause of pt's thrombocytopenia. Has been present on labs since 11-23-204. Pt will need outpatient f/u with PCP for additional workup. She will be off abx by then.       DVT prophylaxis:  apixaban (ELIQUIS) tablet 5 mg     Code Status: Full Code Family Communication: no family at bedside Disposition Plan: return home Reason for continuing need for hospitalization: stable for DC to home.  Objective: Vitals:   12/11/23 2022 12/11/23 2342 12/12/23 0446 12/12/23 0731  BP: (!) 121/50 (!) 121/41 (!) 124/46 (!) 123/37  Pulse: (!) 55 (!) 58 (!) 54 (!) 55  Resp: 18 18 18 16   Temp: 99.3 F (37.4 C) 98.9 F (37.2 C) 99.6 F (37.6 C) 98.2 F (36.8 C)  TempSrc: Oral Oral Oral Oral  SpO2: 100% 99% 97% 97%  Weight:      Height:        Intake/Output Summary (Last 24 hours) at 12/12/2023 1016 Last data filed at 12/11/2023 1600 Gross per 24 hour  Intake 922.68 ml  Output --  Net 922.68 ml   Filed Weights   12/10/23 1337  Weight: 116.1 kg    Examination:  Physical Exam Vitals and nursing note reviewed.  Constitutional:      General: She is not in acute distress.     Appearance: She is obese. She is not toxic-appearing.  HENT:     Head: Normocephalic and atraumatic.  Cardiovascular:     Rate and Rhythm: Normal rate and regular rhythm.  Pulmonary:     Effort: Pulmonary effort is normal.     Breath sounds: Normal breath sounds.  Abdominal:     General: Bowel sounds are normal.     Palpations: Abdomen is soft.  Skin:    Capillary Refill: Capillary refill takes less than 2 seconds.  Neurological:     Mental Status: She is alert and oriented to person, place, and time.     Comments: Similar dysarthria. No change.     Data Reviewed: I have personally reviewed following labs and imaging studies  CBC: Recent Labs  Lab 12/10/23 1307 12/11/23 0618 12/12/23 0620  WBC 5.6 4.2 4.7  NEUTROABS 4.7  --  2.3  HGB 10.0* 9.6* 9.3*  HCT 33.7* 30.1* 29.9*  MCV 97.7 93.8 94.9  PLT 110* 123* 122*   Basic Metabolic Panel: Recent  Labs  Lab 12/10/23 1307 12/11/23 0618 12/12/23 0620  NA 139 137 138  K 3.2* 3.7 3.9  CL 104 104 102  CO2 27 27 30   GLUCOSE 111* 210* 326*  BUN 14 14 15   CREATININE 0.88 1.08* 1.13*  CALCIUM 8.7* 8.3* 8.5*  MG  --   --  1.9   GFR: Estimated Creatinine Clearance: 53.4 mL/min (A) (by C-G formula based on SCr of 1.13 mg/dL (H)). Liver Function Tests: Recent Labs  Lab 12/12/23 0620  AST 26  ALT 28  ALKPHOS 61  BILITOT 0.8  PROT 5.3*  ALBUMIN 2.4*   BNP (last 3 results) Recent Labs    11/17/23 1103 12/10/23 1307  BNP 212.5* 146.6*   CBG: Recent Labs  Lab 12/11/23 1242 12/11/23 1729 12/11/23 1923 12/11/23 2020 12/12/23 0734  GLUCAP 241* 159* 171* 196* 311*   Thyroid Function Tests: Recent Labs    12/10/23 1307  TSH 0.139*  FREET4 2.31*   Sepsis Labs: Recent Labs  Lab 12/10/23 1734 12/10/23 2011 12/12/23 0620  PROCALCITON  --   --  0.69  LATICACIDVEN 1.9 1.2  --     Recent Results (from the past 240 hours)  Blood culture (routine x 2)     Status: None (Preliminary result)   Collection  Time: 12/10/23  5:23 PM   Specimen: BLOOD LEFT HAND  Result Value Ref Range Status   Specimen Description BLOOD LEFT HAND  Final   Special Requests   Final    BOTTLES DRAWN AEROBIC AND ANAEROBIC Blood Culture results may not be optimal due to an inadequate volume of blood received in culture bottles   Culture   Final    NO GROWTH 2 DAYS Performed at [facility], [address], [place], [place] [address]    Report Status PENDING  Incomplete  Blood culture (routine x 2)     Status: None (Preliminary result)   Collection Time: 12/10/23  8:03 PM   Specimen: BLOOD RIGHT ARM  Result Value Ref Range Status   Specimen Description BLOOD RIGHT ARM  Final   Special Requests   Final    BOTTLES DRAWN AEROBIC AND ANAEROBIC Blood Culture adequate volume   Culture   Final    NO GROWTH 2 DAYS Performed at [facility], [address], [place], [place] [address]    Report Status PENDING  Incomplete  Respiratory (~20 pathogens) panel by PCR     Status: None   Collection Time: 12/11/23  7:30 AM   Specimen: Nasopharyngeal Swab; Respiratory  Result Value Ref Range Status   Adenovirus NOT DETECTED NOT DETECTED Final   Coronavirus 229E NOT DETECTED NOT DETECTED Final    Comment: (NOTE) The Coronavirus on the Respiratory Panel, DOES NOT test for the novel  Coronavirus (2019 nCoV)    Coronavirus HKU1 NOT DETECTED NOT DETECTED Final   Coronavirus NL63 NOT DETECTED NOT DETECTED Final   Coronavirus OC43 NOT DETECTED NOT DETECTED Final   Metapneumovirus NOT DETECTED NOT DETECTED Final   Rhinovirus / Enterovirus NOT DETECTED NOT DETECTED Final   Influenza A NOT DETECTED NOT DETECTED Final   Influenza B NOT DETECTED NOT DETECTED Final   Parainfluenza Virus 1 NOT DETECTED NOT DETECTED Final   Parainfluenza Virus 2 NOT DETECTED NOT DETECTED Final   Parainfluenza Virus 3 NOT DETECTED NOT DETECTED Final   Parainfluenza Virus 4 NOT DETECTED NOT DETECTED Final   Respiratory Syncytial Virus NOT  DETECTED NOT DETECTED Final  Bordetella pertussis NOT DETECTED NOT DETECTED Final   Bordetella Parapertussis NOT DETECTED NOT DETECTED Final   Chlamydophila pneumoniae NOT DETECTED NOT DETECTED Final   Mycoplasma pneumoniae NOT DETECTED NOT DETECTED Final    Comment: Performed at [facility], [name], [place], [place] [address]  SARS Coronavirus 2 by RT PCR (hospital order, performed in [facility] hospital lab) *cepheid single result test* Anterior Nasal Swab     Status: None   Collection Time: 12/11/23  7:31 AM   Specimen: Anterior Nasal Swab  Result Value Ref Range Status   SARS Coronavirus 2 by RT PCR NEGATIVE NEGATIVE Final    Comment: Performed at [facility], [address] [place], [place] [address]     Radiology Studies: CT Head Wo Contrast Result Date: 12/10/2023 CLINICAL DATA:  Unresponsive, altered mental status EXAM: CT HEAD WITHOUT CONTRAST TECHNIQUE: Contiguous axial images were obtained from the base of the skull through the vertex without intravenous contrast. RADIATION DOSE REDUCTION: This exam was performed according to the departmental dose-optimization program which includes automated exposure control, adjustment of the mA and/or kV according to patient size and/or use of iterative reconstruction technique. COMPARISON:  11/17/2023 FINDINGS: Brain: No evidence of acute infarction, hemorrhage, mass, mass effect, or midline shift. No hydrocephalus or extra-axial fluid collection. The basilar cisterns are patent. Basal ganglia calcifications. Redemonstrated chronic ischemic changes in the pons. Vascular: No hyperdense vessel. Atherosclerotic calcifications in the intracranial carotid and vertebral arteries. Skull: Negative for fracture or focal lesion. Sinuses/Orbits: No acute finding. Status post bilateral lens replacements. Other: The mastoid air cells are well aerated. IMPRESSION: No acute intracranial process. Electronically Signed   By: Wiliam Ke M.D.    On: 12/10/2023 16:27   DG Chest Port 1 View Result Date: 12/10/2023 CLINICAL DATA:  aspiration evaluation. EXAM: PORTABLE CHEST 1 VIEW COMPARISON:  Chest radiograph dated November 18, 2023. FINDINGS: The heart size is enlarged. Stable left-sided dual lead pacemaker. Loop recorder device overlies the left lower chest wall. Mediastinal contours are unchanged. Aortic atherosclerosis. Bilateral interstitial prominence, most pronounced at the lung bases. Opacities at the right lung base with basilar atelectasis. Possible small right effusion. No pneumothorax identified. No acute osseous abnormality. IMPRESSION: 1. Right basilar opacity may be secondary to aspiration, pneumonia, or asymmetric pulmonary edema. 2. Cardiomegaly with pulmonary vascular congestion. 3. Possible small right effusion. Electronically Signed   By: Hart Robinsons M.D.   On: 12/10/2023 14:32    Scheduled Meds:  amoxicillin-clavulanate  1 tablet Oral Q12H   apixaban  5 mg Oral BID   atorvastatin  80 mg Oral QPM   gabapentin  300 mg Oral TID   insulin aspart  0-5 Units Subcutaneous QHS   insulin aspart  0-9 Units Subcutaneous TID WC   insulin glargine-yfgn  10 Units Subcutaneous Q2200   levothyroxine  175 mcg Oral QAC breakfast   pantoprazole  40 mg Oral BID   sodium chloride flush  3 mL Intravenous Q12H   Continuous Infusions:   LOS: 1 day   Time spent: 40 minutes  Carollee Herter, DO  [facility]  12/12/2023, 10:16 AM

## 2023-12-12 NOTE — Assessment & Plan Note (Signed)
12-12-2023 TSH 0.139, Free T4 2.31(high). pt on 175 mcg daily of synthroid. Will reduce to 150 mcg daily. Pt will need PCP f/u and repeat TSH, FT4 in 6 weeks.

## 2023-12-14 DIAGNOSIS — G3184 Mild cognitive impairment, so stated: Secondary | ICD-10-CM | POA: Diagnosis not present

## 2023-12-14 DIAGNOSIS — K219 Gastro-esophageal reflux disease without esophagitis: Secondary | ICD-10-CM | POA: Diagnosis not present

## 2023-12-14 DIAGNOSIS — I69354 Hemiplegia and hemiparesis following cerebral infarction affecting left non-dominant side: Secondary | ICD-10-CM | POA: Diagnosis not present

## 2023-12-14 DIAGNOSIS — G8929 Other chronic pain: Secondary | ICD-10-CM | POA: Diagnosis not present

## 2023-12-14 DIAGNOSIS — E890 Postprocedural hypothyroidism: Secondary | ICD-10-CM | POA: Diagnosis not present

## 2023-12-14 DIAGNOSIS — I272 Pulmonary hypertension, unspecified: Secondary | ICD-10-CM | POA: Diagnosis not present

## 2023-12-14 DIAGNOSIS — E1142 Type 2 diabetes mellitus with diabetic polyneuropathy: Secondary | ICD-10-CM | POA: Diagnosis not present

## 2023-12-14 DIAGNOSIS — E7800 Pure hypercholesterolemia, unspecified: Secondary | ICD-10-CM | POA: Diagnosis not present

## 2023-12-14 DIAGNOSIS — I442 Atrioventricular block, complete: Secondary | ICD-10-CM | POA: Diagnosis not present

## 2023-12-14 DIAGNOSIS — E11319 Type 2 diabetes mellitus with unspecified diabetic retinopathy without macular edema: Secondary | ICD-10-CM | POA: Diagnosis not present

## 2023-12-14 DIAGNOSIS — K5900 Constipation, unspecified: Secondary | ICD-10-CM | POA: Diagnosis not present

## 2023-12-14 DIAGNOSIS — E11649 Type 2 diabetes mellitus with hypoglycemia without coma: Secondary | ICD-10-CM | POA: Diagnosis not present

## 2023-12-14 DIAGNOSIS — L89622 Pressure ulcer of left heel, stage 2: Secondary | ICD-10-CM | POA: Diagnosis not present

## 2023-12-14 DIAGNOSIS — K649 Unspecified hemorrhoids: Secondary | ICD-10-CM | POA: Diagnosis not present

## 2023-12-14 DIAGNOSIS — D649 Anemia, unspecified: Secondary | ICD-10-CM | POA: Diagnosis not present

## 2023-12-14 DIAGNOSIS — M545 Low back pain, unspecified: Secondary | ICD-10-CM | POA: Diagnosis not present

## 2023-12-14 DIAGNOSIS — M1990 Unspecified osteoarthritis, unspecified site: Secondary | ICD-10-CM | POA: Diagnosis not present

## 2023-12-14 DIAGNOSIS — I5032 Chronic diastolic (congestive) heart failure: Secondary | ICD-10-CM | POA: Diagnosis not present

## 2023-12-14 DIAGNOSIS — H8110 Benign paroxysmal vertigo, unspecified ear: Secondary | ICD-10-CM | POA: Diagnosis not present

## 2023-12-14 DIAGNOSIS — I110 Hypertensive heart disease with heart failure: Secondary | ICD-10-CM | POA: Diagnosis not present

## 2023-12-14 DIAGNOSIS — I480 Paroxysmal atrial fibrillation: Secondary | ICD-10-CM | POA: Diagnosis not present

## 2023-12-14 DIAGNOSIS — J9621 Acute and chronic respiratory failure with hypoxia: Secondary | ICD-10-CM | POA: Diagnosis not present

## 2023-12-15 LAB — CULTURE, BLOOD (ROUTINE X 2)
Culture: NO GROWTH
Culture: NO GROWTH
Special Requests: ADEQUATE

## 2023-12-25 DIAGNOSIS — I480 Paroxysmal atrial fibrillation: Secondary | ICD-10-CM | POA: Diagnosis not present

## 2023-12-25 DIAGNOSIS — I5032 Chronic diastolic (congestive) heart failure: Secondary | ICD-10-CM | POA: Diagnosis not present

## 2023-12-25 DIAGNOSIS — M1990 Unspecified osteoarthritis, unspecified site: Secondary | ICD-10-CM | POA: Diagnosis not present

## 2023-12-25 DIAGNOSIS — G8929 Other chronic pain: Secondary | ICD-10-CM | POA: Diagnosis not present

## 2023-12-25 DIAGNOSIS — K5900 Constipation, unspecified: Secondary | ICD-10-CM | POA: Diagnosis not present

## 2023-12-25 DIAGNOSIS — H8110 Benign paroxysmal vertigo, unspecified ear: Secondary | ICD-10-CM | POA: Diagnosis not present

## 2023-12-25 DIAGNOSIS — E7800 Pure hypercholesterolemia, unspecified: Secondary | ICD-10-CM | POA: Diagnosis not present

## 2023-12-25 DIAGNOSIS — I110 Hypertensive heart disease with heart failure: Secondary | ICD-10-CM | POA: Diagnosis not present

## 2023-12-25 DIAGNOSIS — E1142 Type 2 diabetes mellitus with diabetic polyneuropathy: Secondary | ICD-10-CM | POA: Diagnosis not present

## 2023-12-25 DIAGNOSIS — E11319 Type 2 diabetes mellitus with unspecified diabetic retinopathy without macular edema: Secondary | ICD-10-CM | POA: Diagnosis not present

## 2023-12-25 DIAGNOSIS — K649 Unspecified hemorrhoids: Secondary | ICD-10-CM | POA: Diagnosis not present

## 2023-12-25 DIAGNOSIS — G3184 Mild cognitive impairment, so stated: Secondary | ICD-10-CM | POA: Diagnosis not present

## 2023-12-25 DIAGNOSIS — M545 Low back pain, unspecified: Secondary | ICD-10-CM | POA: Diagnosis not present

## 2023-12-25 DIAGNOSIS — I69354 Hemiplegia and hemiparesis following cerebral infarction affecting left non-dominant side: Secondary | ICD-10-CM | POA: Diagnosis not present

## 2023-12-25 DIAGNOSIS — I272 Pulmonary hypertension, unspecified: Secondary | ICD-10-CM | POA: Diagnosis not present

## 2023-12-25 DIAGNOSIS — K219 Gastro-esophageal reflux disease without esophagitis: Secondary | ICD-10-CM | POA: Diagnosis not present

## 2023-12-25 DIAGNOSIS — E11649 Type 2 diabetes mellitus with hypoglycemia without coma: Secondary | ICD-10-CM | POA: Diagnosis not present

## 2023-12-25 DIAGNOSIS — I442 Atrioventricular block, complete: Secondary | ICD-10-CM | POA: Diagnosis not present

## 2023-12-25 DIAGNOSIS — L89622 Pressure ulcer of left heel, stage 2: Secondary | ICD-10-CM | POA: Diagnosis not present

## 2023-12-25 DIAGNOSIS — E890 Postprocedural hypothyroidism: Secondary | ICD-10-CM | POA: Diagnosis not present

## 2023-12-25 DIAGNOSIS — J9621 Acute and chronic respiratory failure with hypoxia: Secondary | ICD-10-CM | POA: Diagnosis not present

## 2023-12-25 DIAGNOSIS — D649 Anemia, unspecified: Secondary | ICD-10-CM | POA: Diagnosis not present

## 2023-12-30 ENCOUNTER — Other Ambulatory Visit: Payer: Self-pay | Admitting: Internal Medicine

## 2024-01-01 DIAGNOSIS — M1990 Unspecified osteoarthritis, unspecified site: Secondary | ICD-10-CM | POA: Diagnosis not present

## 2024-01-01 DIAGNOSIS — D649 Anemia, unspecified: Secondary | ICD-10-CM | POA: Diagnosis not present

## 2024-01-01 DIAGNOSIS — I272 Pulmonary hypertension, unspecified: Secondary | ICD-10-CM | POA: Diagnosis not present

## 2024-01-01 DIAGNOSIS — E890 Postprocedural hypothyroidism: Secondary | ICD-10-CM | POA: Diagnosis not present

## 2024-01-01 DIAGNOSIS — I110 Hypertensive heart disease with heart failure: Secondary | ICD-10-CM | POA: Diagnosis not present

## 2024-01-01 DIAGNOSIS — G3184 Mild cognitive impairment, so stated: Secondary | ICD-10-CM | POA: Diagnosis not present

## 2024-01-01 DIAGNOSIS — K5900 Constipation, unspecified: Secondary | ICD-10-CM | POA: Diagnosis not present

## 2024-01-01 DIAGNOSIS — I442 Atrioventricular block, complete: Secondary | ICD-10-CM | POA: Diagnosis not present

## 2024-01-01 DIAGNOSIS — E11319 Type 2 diabetes mellitus with unspecified diabetic retinopathy without macular edema: Secondary | ICD-10-CM | POA: Diagnosis not present

## 2024-01-01 DIAGNOSIS — M545 Low back pain, unspecified: Secondary | ICD-10-CM | POA: Diagnosis not present

## 2024-01-01 DIAGNOSIS — E11649 Type 2 diabetes mellitus with hypoglycemia without coma: Secondary | ICD-10-CM | POA: Diagnosis not present

## 2024-01-01 DIAGNOSIS — I5032 Chronic diastolic (congestive) heart failure: Secondary | ICD-10-CM | POA: Diagnosis not present

## 2024-01-01 DIAGNOSIS — H8110 Benign paroxysmal vertigo, unspecified ear: Secondary | ICD-10-CM | POA: Diagnosis not present

## 2024-01-01 DIAGNOSIS — I69354 Hemiplegia and hemiparesis following cerebral infarction affecting left non-dominant side: Secondary | ICD-10-CM | POA: Diagnosis not present

## 2024-01-01 DIAGNOSIS — K219 Gastro-esophageal reflux disease without esophagitis: Secondary | ICD-10-CM | POA: Diagnosis not present

## 2024-01-01 DIAGNOSIS — L89622 Pressure ulcer of left heel, stage 2: Secondary | ICD-10-CM | POA: Diagnosis not present

## 2024-01-01 DIAGNOSIS — E7800 Pure hypercholesterolemia, unspecified: Secondary | ICD-10-CM | POA: Diagnosis not present

## 2024-01-01 DIAGNOSIS — K649 Unspecified hemorrhoids: Secondary | ICD-10-CM | POA: Diagnosis not present

## 2024-01-01 DIAGNOSIS — G8929 Other chronic pain: Secondary | ICD-10-CM | POA: Diagnosis not present

## 2024-01-01 DIAGNOSIS — J9621 Acute and chronic respiratory failure with hypoxia: Secondary | ICD-10-CM | POA: Diagnosis not present

## 2024-01-01 DIAGNOSIS — E1142 Type 2 diabetes mellitus with diabetic polyneuropathy: Secondary | ICD-10-CM | POA: Diagnosis not present

## 2024-01-01 DIAGNOSIS — I480 Paroxysmal atrial fibrillation: Secondary | ICD-10-CM | POA: Diagnosis not present

## 2024-01-03 ENCOUNTER — Ambulatory Visit (INDEPENDENT_AMBULATORY_CARE_PROVIDER_SITE_OTHER): Payer: Medicare Other

## 2024-01-03 DIAGNOSIS — I442 Atrioventricular block, complete: Secondary | ICD-10-CM | POA: Diagnosis not present

## 2024-01-03 LAB — CUP PACEART REMOTE DEVICE CHECK
Battery Remaining Longevity: 97 mo
Battery Voltage: 3 V
Brady Statistic AP VP Percent: 38.19 %
Brady Statistic AP VS Percent: 1.04 %
Brady Statistic AS VP Percent: 57.24 %
Brady Statistic AS VS Percent: 3.51 %
Brady Statistic RA Percent Paced: 34.29 %
Brady Statistic RV Percent Paced: 92.18 %
Date Time Interrogation Session: 20250109050706
Implantable Lead Connection Status: 753985
Implantable Lead Connection Status: 753985
Implantable Lead Implant Date: 20210702
Implantable Lead Implant Date: 20210702
Implantable Lead Location: 753859
Implantable Lead Location: 753860
Implantable Lead Model: 5076
Implantable Lead Model: 5076
Implantable Pulse Generator Implant Date: 20210702
Lead Channel Impedance Value: 266 Ohm
Lead Channel Impedance Value: 342 Ohm
Lead Channel Impedance Value: 342 Ohm
Lead Channel Impedance Value: 399 Ohm
Lead Channel Pacing Threshold Amplitude: 0.625 V
Lead Channel Pacing Threshold Amplitude: 0.75 V
Lead Channel Pacing Threshold Pulse Width: 0.4 ms
Lead Channel Pacing Threshold Pulse Width: 0.4 ms
Lead Channel Sensing Intrinsic Amplitude: 4.375 mV
Lead Channel Sensing Intrinsic Amplitude: 4.375 mV
Lead Channel Sensing Intrinsic Amplitude: 7.5 mV
Lead Channel Sensing Intrinsic Amplitude: 7.5 mV
Lead Channel Setting Pacing Amplitude: 1.5 V
Lead Channel Setting Pacing Amplitude: 2.5 V
Lead Channel Setting Pacing Pulse Width: 0.4 ms
Lead Channel Setting Sensing Sensitivity: 1.2 mV
Zone Setting Status: 755011
Zone Setting Status: 755011

## 2024-01-09 DIAGNOSIS — I5032 Chronic diastolic (congestive) heart failure: Secondary | ICD-10-CM | POA: Diagnosis not present

## 2024-01-09 DIAGNOSIS — D649 Anemia, unspecified: Secondary | ICD-10-CM | POA: Diagnosis not present

## 2024-01-09 DIAGNOSIS — E11649 Type 2 diabetes mellitus with hypoglycemia without coma: Secondary | ICD-10-CM | POA: Diagnosis not present

## 2024-01-09 DIAGNOSIS — I272 Pulmonary hypertension, unspecified: Secondary | ICD-10-CM | POA: Diagnosis not present

## 2024-01-09 DIAGNOSIS — L89622 Pressure ulcer of left heel, stage 2: Secondary | ICD-10-CM | POA: Diagnosis not present

## 2024-01-09 DIAGNOSIS — K219 Gastro-esophageal reflux disease without esophagitis: Secondary | ICD-10-CM | POA: Diagnosis not present

## 2024-01-09 DIAGNOSIS — H8110 Benign paroxysmal vertigo, unspecified ear: Secondary | ICD-10-CM | POA: Diagnosis not present

## 2024-01-09 DIAGNOSIS — E1142 Type 2 diabetes mellitus with diabetic polyneuropathy: Secondary | ICD-10-CM | POA: Diagnosis not present

## 2024-01-09 DIAGNOSIS — K649 Unspecified hemorrhoids: Secondary | ICD-10-CM | POA: Diagnosis not present

## 2024-01-09 DIAGNOSIS — I69354 Hemiplegia and hemiparesis following cerebral infarction affecting left non-dominant side: Secondary | ICD-10-CM | POA: Diagnosis not present

## 2024-01-09 DIAGNOSIS — I110 Hypertensive heart disease with heart failure: Secondary | ICD-10-CM | POA: Diagnosis not present

## 2024-01-09 DIAGNOSIS — E890 Postprocedural hypothyroidism: Secondary | ICD-10-CM | POA: Diagnosis not present

## 2024-01-09 DIAGNOSIS — J9621 Acute and chronic respiratory failure with hypoxia: Secondary | ICD-10-CM | POA: Diagnosis not present

## 2024-01-09 DIAGNOSIS — G3184 Mild cognitive impairment, so stated: Secondary | ICD-10-CM | POA: Diagnosis not present

## 2024-01-09 DIAGNOSIS — K5900 Constipation, unspecified: Secondary | ICD-10-CM | POA: Diagnosis not present

## 2024-01-09 DIAGNOSIS — G8929 Other chronic pain: Secondary | ICD-10-CM | POA: Diagnosis not present

## 2024-01-09 DIAGNOSIS — I442 Atrioventricular block, complete: Secondary | ICD-10-CM | POA: Diagnosis not present

## 2024-01-09 DIAGNOSIS — M545 Low back pain, unspecified: Secondary | ICD-10-CM | POA: Diagnosis not present

## 2024-01-09 DIAGNOSIS — I480 Paroxysmal atrial fibrillation: Secondary | ICD-10-CM | POA: Diagnosis not present

## 2024-01-09 DIAGNOSIS — E11319 Type 2 diabetes mellitus with unspecified diabetic retinopathy without macular edema: Secondary | ICD-10-CM | POA: Diagnosis not present

## 2024-01-09 DIAGNOSIS — E7800 Pure hypercholesterolemia, unspecified: Secondary | ICD-10-CM | POA: Diagnosis not present

## 2024-01-09 DIAGNOSIS — M1990 Unspecified osteoarthritis, unspecified site: Secondary | ICD-10-CM | POA: Diagnosis not present

## 2024-01-15 ENCOUNTER — Encounter: Payer: Self-pay | Admitting: Podiatry

## 2024-01-15 ENCOUNTER — Ambulatory Visit (INDEPENDENT_AMBULATORY_CARE_PROVIDER_SITE_OTHER): Payer: Medicare Other | Admitting: Podiatry

## 2024-01-15 DIAGNOSIS — B351 Tinea unguium: Secondary | ICD-10-CM | POA: Diagnosis not present

## 2024-01-15 DIAGNOSIS — E1159 Type 2 diabetes mellitus with other circulatory complications: Secondary | ICD-10-CM

## 2024-01-15 DIAGNOSIS — M79674 Pain in right toe(s): Secondary | ICD-10-CM | POA: Diagnosis not present

## 2024-01-15 DIAGNOSIS — M79675 Pain in left toe(s): Secondary | ICD-10-CM | POA: Diagnosis not present

## 2024-01-15 DIAGNOSIS — D689 Coagulation defect, unspecified: Secondary | ICD-10-CM

## 2024-01-15 NOTE — Progress Notes (Signed)
This patient returns to my office for at risk foot care.  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  This patient is unable to cut nails herself  since the patient cannot reach her  nails.These nails are painful walking and wearing shoes.  Patient presents in a wheelchair  with her granddaughter..  This patient presents for at risk foot care today.  General Appearance  Alert, conversant and in no acute stress.  Vascular  Dorsalis pedis and posterior tibial  pulses are absent due to swelling. bilaterally.  Capillary return is within normal limits  bilaterally. Temperature is within normal limits  bilaterally.  Neurologic  Senn-Weinstein monofilament wire diminished  bilaterally. Muscle power within normal limits bilaterally.  Nails Thick disfigured discolored nails with subungual debris  from hallux to fifth toes bilaterally. No evidence of bacterial infection or drainage bilaterally.  Orthopedic  No limitations of motion  feet .  No crepitus or effusions noted.  No bony pathology or digital deformities noted.  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  Hallux  malleus  B/L  Skin  normotropic skin with no porokeratosis noted bilaterally.  Skin lesion on dorsum of hallux malleus  B/L.    Onychomycosis  Pain in right toes  Pain in left toes  Consent was obtained for treatment procedures.   Mechanical debridement of nails 1-5  bilaterally performed with a nail nipper.  Filed with dremel without incident.    Return office visit    3 months       Told patient to return for periodic foot care and evaluation due to potential at risk complications.   Helane Gunther DPM

## 2024-01-16 DIAGNOSIS — I480 Paroxysmal atrial fibrillation: Secondary | ICD-10-CM | POA: Diagnosis not present

## 2024-01-16 DIAGNOSIS — E11649 Type 2 diabetes mellitus with hypoglycemia without coma: Secondary | ICD-10-CM | POA: Diagnosis not present

## 2024-01-16 DIAGNOSIS — L89622 Pressure ulcer of left heel, stage 2: Secondary | ICD-10-CM | POA: Diagnosis not present

## 2024-01-16 DIAGNOSIS — G8929 Other chronic pain: Secondary | ICD-10-CM | POA: Diagnosis not present

## 2024-01-16 DIAGNOSIS — K219 Gastro-esophageal reflux disease without esophagitis: Secondary | ICD-10-CM | POA: Diagnosis not present

## 2024-01-16 DIAGNOSIS — I69354 Hemiplegia and hemiparesis following cerebral infarction affecting left non-dominant side: Secondary | ICD-10-CM | POA: Diagnosis not present

## 2024-01-16 DIAGNOSIS — G3184 Mild cognitive impairment, so stated: Secondary | ICD-10-CM | POA: Diagnosis not present

## 2024-01-16 DIAGNOSIS — E7800 Pure hypercholesterolemia, unspecified: Secondary | ICD-10-CM | POA: Diagnosis not present

## 2024-01-16 DIAGNOSIS — E11319 Type 2 diabetes mellitus with unspecified diabetic retinopathy without macular edema: Secondary | ICD-10-CM | POA: Diagnosis not present

## 2024-01-16 DIAGNOSIS — K649 Unspecified hemorrhoids: Secondary | ICD-10-CM | POA: Diagnosis not present

## 2024-01-16 DIAGNOSIS — I272 Pulmonary hypertension, unspecified: Secondary | ICD-10-CM | POA: Diagnosis not present

## 2024-01-16 DIAGNOSIS — E890 Postprocedural hypothyroidism: Secondary | ICD-10-CM | POA: Diagnosis not present

## 2024-01-16 DIAGNOSIS — M1990 Unspecified osteoarthritis, unspecified site: Secondary | ICD-10-CM | POA: Diagnosis not present

## 2024-01-16 DIAGNOSIS — E1142 Type 2 diabetes mellitus with diabetic polyneuropathy: Secondary | ICD-10-CM | POA: Diagnosis not present

## 2024-01-16 DIAGNOSIS — J9621 Acute and chronic respiratory failure with hypoxia: Secondary | ICD-10-CM | POA: Diagnosis not present

## 2024-01-16 DIAGNOSIS — I5032 Chronic diastolic (congestive) heart failure: Secondary | ICD-10-CM | POA: Diagnosis not present

## 2024-01-16 DIAGNOSIS — I442 Atrioventricular block, complete: Secondary | ICD-10-CM | POA: Diagnosis not present

## 2024-01-16 DIAGNOSIS — I110 Hypertensive heart disease with heart failure: Secondary | ICD-10-CM | POA: Diagnosis not present

## 2024-01-16 DIAGNOSIS — M545 Low back pain, unspecified: Secondary | ICD-10-CM | POA: Diagnosis not present

## 2024-01-16 DIAGNOSIS — K5900 Constipation, unspecified: Secondary | ICD-10-CM | POA: Diagnosis not present

## 2024-01-16 DIAGNOSIS — H8110 Benign paroxysmal vertigo, unspecified ear: Secondary | ICD-10-CM | POA: Diagnosis not present

## 2024-01-16 DIAGNOSIS — D649 Anemia, unspecified: Secondary | ICD-10-CM | POA: Diagnosis not present

## 2024-01-21 ENCOUNTER — Other Ambulatory Visit (HOSPITAL_COMMUNITY): Payer: Self-pay

## 2024-01-22 ENCOUNTER — Other Ambulatory Visit (HOSPITAL_COMMUNITY): Payer: Self-pay

## 2024-01-23 DIAGNOSIS — J9621 Acute and chronic respiratory failure with hypoxia: Secondary | ICD-10-CM | POA: Diagnosis not present

## 2024-01-23 DIAGNOSIS — M545 Low back pain, unspecified: Secondary | ICD-10-CM | POA: Diagnosis not present

## 2024-01-23 DIAGNOSIS — I272 Pulmonary hypertension, unspecified: Secondary | ICD-10-CM | POA: Diagnosis not present

## 2024-01-23 DIAGNOSIS — E7800 Pure hypercholesterolemia, unspecified: Secondary | ICD-10-CM | POA: Diagnosis not present

## 2024-01-23 DIAGNOSIS — I69354 Hemiplegia and hemiparesis following cerebral infarction affecting left non-dominant side: Secondary | ICD-10-CM | POA: Diagnosis not present

## 2024-01-23 DIAGNOSIS — E11319 Type 2 diabetes mellitus with unspecified diabetic retinopathy without macular edema: Secondary | ICD-10-CM | POA: Diagnosis not present

## 2024-01-23 DIAGNOSIS — K5900 Constipation, unspecified: Secondary | ICD-10-CM | POA: Diagnosis not present

## 2024-01-23 DIAGNOSIS — E890 Postprocedural hypothyroidism: Secondary | ICD-10-CM | POA: Diagnosis not present

## 2024-01-23 DIAGNOSIS — D649 Anemia, unspecified: Secondary | ICD-10-CM | POA: Diagnosis not present

## 2024-01-23 DIAGNOSIS — K219 Gastro-esophageal reflux disease without esophagitis: Secondary | ICD-10-CM | POA: Diagnosis not present

## 2024-01-23 DIAGNOSIS — E1142 Type 2 diabetes mellitus with diabetic polyneuropathy: Secondary | ICD-10-CM | POA: Diagnosis not present

## 2024-01-23 DIAGNOSIS — M1990 Unspecified osteoarthritis, unspecified site: Secondary | ICD-10-CM | POA: Diagnosis not present

## 2024-01-23 DIAGNOSIS — L89622 Pressure ulcer of left heel, stage 2: Secondary | ICD-10-CM | POA: Diagnosis not present

## 2024-01-23 DIAGNOSIS — I480 Paroxysmal atrial fibrillation: Secondary | ICD-10-CM | POA: Diagnosis not present

## 2024-01-23 DIAGNOSIS — K649 Unspecified hemorrhoids: Secondary | ICD-10-CM | POA: Diagnosis not present

## 2024-01-23 DIAGNOSIS — I5032 Chronic diastolic (congestive) heart failure: Secondary | ICD-10-CM | POA: Diagnosis not present

## 2024-01-23 DIAGNOSIS — G3184 Mild cognitive impairment, so stated: Secondary | ICD-10-CM | POA: Diagnosis not present

## 2024-01-23 DIAGNOSIS — H8110 Benign paroxysmal vertigo, unspecified ear: Secondary | ICD-10-CM | POA: Diagnosis not present

## 2024-01-23 DIAGNOSIS — I110 Hypertensive heart disease with heart failure: Secondary | ICD-10-CM | POA: Diagnosis not present

## 2024-01-23 DIAGNOSIS — E11649 Type 2 diabetes mellitus with hypoglycemia without coma: Secondary | ICD-10-CM | POA: Diagnosis not present

## 2024-01-23 DIAGNOSIS — I442 Atrioventricular block, complete: Secondary | ICD-10-CM | POA: Diagnosis not present

## 2024-01-23 DIAGNOSIS — G8929 Other chronic pain: Secondary | ICD-10-CM | POA: Diagnosis not present

## 2024-01-28 DIAGNOSIS — L89622 Pressure ulcer of left heel, stage 2: Secondary | ICD-10-CM | POA: Diagnosis not present

## 2024-01-28 DIAGNOSIS — I442 Atrioventricular block, complete: Secondary | ICD-10-CM | POA: Diagnosis not present

## 2024-01-28 DIAGNOSIS — I110 Hypertensive heart disease with heart failure: Secondary | ICD-10-CM | POA: Diagnosis not present

## 2024-01-28 DIAGNOSIS — E7800 Pure hypercholesterolemia, unspecified: Secondary | ICD-10-CM | POA: Diagnosis not present

## 2024-01-28 DIAGNOSIS — D649 Anemia, unspecified: Secondary | ICD-10-CM | POA: Diagnosis not present

## 2024-01-28 DIAGNOSIS — M1990 Unspecified osteoarthritis, unspecified site: Secondary | ICD-10-CM | POA: Diagnosis not present

## 2024-01-28 DIAGNOSIS — G8929 Other chronic pain: Secondary | ICD-10-CM | POA: Diagnosis not present

## 2024-01-28 DIAGNOSIS — I69354 Hemiplegia and hemiparesis following cerebral infarction affecting left non-dominant side: Secondary | ICD-10-CM | POA: Diagnosis not present

## 2024-01-28 DIAGNOSIS — G3184 Mild cognitive impairment, so stated: Secondary | ICD-10-CM | POA: Diagnosis not present

## 2024-01-28 DIAGNOSIS — K649 Unspecified hemorrhoids: Secondary | ICD-10-CM | POA: Diagnosis not present

## 2024-01-28 DIAGNOSIS — I480 Paroxysmal atrial fibrillation: Secondary | ICD-10-CM | POA: Diagnosis not present

## 2024-01-28 DIAGNOSIS — K5900 Constipation, unspecified: Secondary | ICD-10-CM | POA: Diagnosis not present

## 2024-01-28 DIAGNOSIS — I5032 Chronic diastolic (congestive) heart failure: Secondary | ICD-10-CM | POA: Diagnosis not present

## 2024-01-28 DIAGNOSIS — K219 Gastro-esophageal reflux disease without esophagitis: Secondary | ICD-10-CM | POA: Diagnosis not present

## 2024-01-28 DIAGNOSIS — M545 Low back pain, unspecified: Secondary | ICD-10-CM | POA: Diagnosis not present

## 2024-01-28 DIAGNOSIS — E11319 Type 2 diabetes mellitus with unspecified diabetic retinopathy without macular edema: Secondary | ICD-10-CM | POA: Diagnosis not present

## 2024-01-28 DIAGNOSIS — H8110 Benign paroxysmal vertigo, unspecified ear: Secondary | ICD-10-CM | POA: Diagnosis not present

## 2024-01-28 DIAGNOSIS — E11649 Type 2 diabetes mellitus with hypoglycemia without coma: Secondary | ICD-10-CM | POA: Diagnosis not present

## 2024-01-28 DIAGNOSIS — I272 Pulmonary hypertension, unspecified: Secondary | ICD-10-CM | POA: Diagnosis not present

## 2024-01-28 DIAGNOSIS — E1142 Type 2 diabetes mellitus with diabetic polyneuropathy: Secondary | ICD-10-CM | POA: Diagnosis not present

## 2024-01-28 DIAGNOSIS — E890 Postprocedural hypothyroidism: Secondary | ICD-10-CM | POA: Diagnosis not present

## 2024-01-28 DIAGNOSIS — J9621 Acute and chronic respiratory failure with hypoxia: Secondary | ICD-10-CM | POA: Diagnosis not present

## 2024-01-29 DIAGNOSIS — I480 Paroxysmal atrial fibrillation: Secondary | ICD-10-CM | POA: Diagnosis not present

## 2024-01-29 DIAGNOSIS — I110 Hypertensive heart disease with heart failure: Secondary | ICD-10-CM | POA: Diagnosis not present

## 2024-01-29 DIAGNOSIS — I5032 Chronic diastolic (congestive) heart failure: Secondary | ICD-10-CM | POA: Diagnosis not present

## 2024-01-29 DIAGNOSIS — I69354 Hemiplegia and hemiparesis following cerebral infarction affecting left non-dominant side: Secondary | ICD-10-CM | POA: Diagnosis not present

## 2024-01-29 DIAGNOSIS — I442 Atrioventricular block, complete: Secondary | ICD-10-CM | POA: Diagnosis not present

## 2024-01-29 DIAGNOSIS — M1990 Unspecified osteoarthritis, unspecified site: Secondary | ICD-10-CM | POA: Diagnosis not present

## 2024-01-29 DIAGNOSIS — L89622 Pressure ulcer of left heel, stage 2: Secondary | ICD-10-CM | POA: Diagnosis not present

## 2024-01-29 DIAGNOSIS — E7800 Pure hypercholesterolemia, unspecified: Secondary | ICD-10-CM | POA: Diagnosis not present

## 2024-01-29 DIAGNOSIS — K5900 Constipation, unspecified: Secondary | ICD-10-CM | POA: Diagnosis not present

## 2024-01-29 DIAGNOSIS — K219 Gastro-esophageal reflux disease without esophagitis: Secondary | ICD-10-CM | POA: Diagnosis not present

## 2024-01-29 DIAGNOSIS — I272 Pulmonary hypertension, unspecified: Secondary | ICD-10-CM | POA: Diagnosis not present

## 2024-01-31 DIAGNOSIS — E119 Type 2 diabetes mellitus without complications: Secondary | ICD-10-CM | POA: Diagnosis not present

## 2024-01-31 DIAGNOSIS — E109 Type 1 diabetes mellitus without complications: Secondary | ICD-10-CM | POA: Diagnosis not present

## 2024-01-31 DIAGNOSIS — I69354 Hemiplegia and hemiparesis following cerebral infarction affecting left non-dominant side: Secondary | ICD-10-CM | POA: Diagnosis not present

## 2024-02-01 DIAGNOSIS — E039 Hypothyroidism, unspecified: Secondary | ICD-10-CM | POA: Diagnosis not present

## 2024-02-01 DIAGNOSIS — N1831 Chronic kidney disease, stage 3a: Secondary | ICD-10-CM | POA: Diagnosis not present

## 2024-02-01 DIAGNOSIS — I10 Essential (primary) hypertension: Secondary | ICD-10-CM | POA: Diagnosis not present

## 2024-02-01 DIAGNOSIS — D649 Anemia, unspecified: Secondary | ICD-10-CM | POA: Diagnosis not present

## 2024-02-01 DIAGNOSIS — E785 Hyperlipidemia, unspecified: Secondary | ICD-10-CM | POA: Diagnosis not present

## 2024-02-01 DIAGNOSIS — I5032 Chronic diastolic (congestive) heart failure: Secondary | ICD-10-CM | POA: Diagnosis not present

## 2024-02-01 DIAGNOSIS — I480 Paroxysmal atrial fibrillation: Secondary | ICD-10-CM | POA: Diagnosis not present

## 2024-02-01 DIAGNOSIS — K219 Gastro-esophageal reflux disease without esophagitis: Secondary | ICD-10-CM | POA: Diagnosis not present

## 2024-02-01 DIAGNOSIS — R809 Proteinuria, unspecified: Secondary | ICD-10-CM | POA: Diagnosis not present

## 2024-02-01 DIAGNOSIS — E1142 Type 2 diabetes mellitus with diabetic polyneuropathy: Secondary | ICD-10-CM | POA: Diagnosis not present

## 2024-02-01 DIAGNOSIS — E1069 Type 1 diabetes mellitus with other specified complication: Secondary | ICD-10-CM | POA: Diagnosis not present

## 2024-02-01 LAB — LAB REPORT - SCANNED
A1c: 8.8
EGFR: 58
TSH: 1.31 (ref 0.41–5.90)

## 2024-02-06 DIAGNOSIS — G3184 Mild cognitive impairment, so stated: Secondary | ICD-10-CM | POA: Diagnosis not present

## 2024-02-06 DIAGNOSIS — M545 Low back pain, unspecified: Secondary | ICD-10-CM | POA: Diagnosis not present

## 2024-02-06 DIAGNOSIS — K219 Gastro-esophageal reflux disease without esophagitis: Secondary | ICD-10-CM | POA: Diagnosis not present

## 2024-02-06 DIAGNOSIS — I110 Hypertensive heart disease with heart failure: Secondary | ICD-10-CM | POA: Diagnosis not present

## 2024-02-06 DIAGNOSIS — K5900 Constipation, unspecified: Secondary | ICD-10-CM | POA: Diagnosis not present

## 2024-02-06 DIAGNOSIS — I480 Paroxysmal atrial fibrillation: Secondary | ICD-10-CM | POA: Diagnosis not present

## 2024-02-06 DIAGNOSIS — M1990 Unspecified osteoarthritis, unspecified site: Secondary | ICD-10-CM | POA: Diagnosis not present

## 2024-02-06 DIAGNOSIS — L89622 Pressure ulcer of left heel, stage 2: Secondary | ICD-10-CM | POA: Diagnosis not present

## 2024-02-06 DIAGNOSIS — E7800 Pure hypercholesterolemia, unspecified: Secondary | ICD-10-CM | POA: Diagnosis not present

## 2024-02-06 DIAGNOSIS — H8110 Benign paroxysmal vertigo, unspecified ear: Secondary | ICD-10-CM | POA: Diagnosis not present

## 2024-02-06 DIAGNOSIS — K649 Unspecified hemorrhoids: Secondary | ICD-10-CM | POA: Diagnosis not present

## 2024-02-06 DIAGNOSIS — E890 Postprocedural hypothyroidism: Secondary | ICD-10-CM | POA: Diagnosis not present

## 2024-02-06 DIAGNOSIS — I5032 Chronic diastolic (congestive) heart failure: Secondary | ICD-10-CM | POA: Diagnosis not present

## 2024-02-06 DIAGNOSIS — I442 Atrioventricular block, complete: Secondary | ICD-10-CM | POA: Diagnosis not present

## 2024-02-06 DIAGNOSIS — D649 Anemia, unspecified: Secondary | ICD-10-CM | POA: Diagnosis not present

## 2024-02-06 DIAGNOSIS — I69354 Hemiplegia and hemiparesis following cerebral infarction affecting left non-dominant side: Secondary | ICD-10-CM | POA: Diagnosis not present

## 2024-02-06 DIAGNOSIS — G8929 Other chronic pain: Secondary | ICD-10-CM | POA: Diagnosis not present

## 2024-02-06 DIAGNOSIS — J9621 Acute and chronic respiratory failure with hypoxia: Secondary | ICD-10-CM | POA: Diagnosis not present

## 2024-02-06 DIAGNOSIS — I272 Pulmonary hypertension, unspecified: Secondary | ICD-10-CM | POA: Diagnosis not present

## 2024-02-06 DIAGNOSIS — E10621 Type 1 diabetes mellitus with foot ulcer: Secondary | ICD-10-CM | POA: Diagnosis not present

## 2024-02-12 DIAGNOSIS — E890 Postprocedural hypothyroidism: Secondary | ICD-10-CM | POA: Diagnosis not present

## 2024-02-12 DIAGNOSIS — I639 Cerebral infarction, unspecified: Secondary | ICD-10-CM | POA: Diagnosis not present

## 2024-02-12 DIAGNOSIS — E11319 Type 2 diabetes mellitus with unspecified diabetic retinopathy without macular edema: Secondary | ICD-10-CM | POA: Diagnosis not present

## 2024-02-12 DIAGNOSIS — I5032 Chronic diastolic (congestive) heart failure: Secondary | ICD-10-CM | POA: Diagnosis not present

## 2024-02-12 DIAGNOSIS — Z9641 Presence of insulin pump (external) (internal): Secondary | ICD-10-CM | POA: Diagnosis not present

## 2024-02-12 DIAGNOSIS — D649 Anemia, unspecified: Secondary | ICD-10-CM | POA: Diagnosis not present

## 2024-02-12 DIAGNOSIS — I110 Hypertensive heart disease with heart failure: Secondary | ICD-10-CM | POA: Diagnosis not present

## 2024-02-12 DIAGNOSIS — M545 Low back pain, unspecified: Secondary | ICD-10-CM | POA: Diagnosis not present

## 2024-02-12 DIAGNOSIS — I480 Paroxysmal atrial fibrillation: Secondary | ICD-10-CM | POA: Diagnosis not present

## 2024-02-12 DIAGNOSIS — I442 Atrioventricular block, complete: Secondary | ICD-10-CM | POA: Diagnosis not present

## 2024-02-12 DIAGNOSIS — Z794 Long term (current) use of insulin: Secondary | ICD-10-CM | POA: Diagnosis not present

## 2024-02-12 DIAGNOSIS — K219 Gastro-esophageal reflux disease without esophagitis: Secondary | ICD-10-CM | POA: Diagnosis not present

## 2024-02-12 DIAGNOSIS — H8110 Benign paroxysmal vertigo, unspecified ear: Secondary | ICD-10-CM | POA: Diagnosis not present

## 2024-02-12 DIAGNOSIS — I272 Pulmonary hypertension, unspecified: Secondary | ICD-10-CM | POA: Diagnosis not present

## 2024-02-12 DIAGNOSIS — I10 Essential (primary) hypertension: Secondary | ICD-10-CM | POA: Diagnosis not present

## 2024-02-12 DIAGNOSIS — E7800 Pure hypercholesterolemia, unspecified: Secondary | ICD-10-CM | POA: Diagnosis not present

## 2024-02-12 DIAGNOSIS — I69354 Hemiplegia and hemiparesis following cerebral infarction affecting left non-dominant side: Secondary | ICD-10-CM | POA: Diagnosis not present

## 2024-02-12 DIAGNOSIS — C73 Malignant neoplasm of thyroid gland: Secondary | ICD-10-CM | POA: Diagnosis not present

## 2024-02-12 DIAGNOSIS — M1990 Unspecified osteoarthritis, unspecified site: Secondary | ICD-10-CM | POA: Diagnosis not present

## 2024-02-12 DIAGNOSIS — E10621 Type 1 diabetes mellitus with foot ulcer: Secondary | ICD-10-CM | POA: Diagnosis not present

## 2024-02-12 DIAGNOSIS — G8929 Other chronic pain: Secondary | ICD-10-CM | POA: Diagnosis not present

## 2024-02-12 DIAGNOSIS — E109 Type 1 diabetes mellitus without complications: Secondary | ICD-10-CM | POA: Diagnosis not present

## 2024-02-12 DIAGNOSIS — G3184 Mild cognitive impairment, so stated: Secondary | ICD-10-CM | POA: Diagnosis not present

## 2024-02-12 DIAGNOSIS — K5900 Constipation, unspecified: Secondary | ICD-10-CM | POA: Diagnosis not present

## 2024-02-12 DIAGNOSIS — J9621 Acute and chronic respiratory failure with hypoxia: Secondary | ICD-10-CM | POA: Diagnosis not present

## 2024-02-12 DIAGNOSIS — K649 Unspecified hemorrhoids: Secondary | ICD-10-CM | POA: Diagnosis not present

## 2024-02-12 DIAGNOSIS — L89622 Pressure ulcer of left heel, stage 2: Secondary | ICD-10-CM | POA: Diagnosis not present

## 2024-02-12 NOTE — Progress Notes (Signed)
 Remote pacemaker transmission.

## 2024-02-18 ENCOUNTER — Ambulatory Visit: Payer: Medicare Other | Admitting: Pulmonary Disease

## 2024-02-18 ENCOUNTER — Encounter: Payer: Self-pay | Admitting: Pulmonary Disease

## 2024-02-18 VITALS — BP 120/60 | HR 76 | Ht 67.0 in

## 2024-02-18 DIAGNOSIS — J9611 Chronic respiratory failure with hypoxia: Secondary | ICD-10-CM

## 2024-02-18 DIAGNOSIS — Z9981 Dependence on supplemental oxygen: Secondary | ICD-10-CM | POA: Diagnosis not present

## 2024-02-18 DIAGNOSIS — G4733 Obstructive sleep apnea (adult) (pediatric): Secondary | ICD-10-CM | POA: Diagnosis not present

## 2024-02-18 MED ORDER — SYMBICORT 80-4.5 MCG/ACT IN AERO: 2.0000 | INHALATION_SPRAY | Freq: Two times a day (BID) | RESPIRATORY_TRACT | 11 refills | Status: DC

## 2024-02-18 MED ORDER — BUDESONIDE-FORMOTEROL FUMARATE 80-4.5 MCG/ACT IN AERO: 2.0000 | INHALATION_SPRAY | Freq: Two times a day (BID) | RESPIRATORY_TRACT | 11 refills | Status: DC

## 2024-02-18 MED ORDER — ALBUTEROL SULFATE HFA 108 (90 BASE) MCG/ACT IN AERS: 2.0000 | INHALATION_SPRAY | Freq: Four times a day (QID) | RESPIRATORY_TRACT | 11 refills | Status: DC | PRN

## 2024-02-18 NOTE — Progress Notes (Deleted)
 Eyes:  @Patient  ID: [name], female    DOB: [birth year], 73 y.o.   MRN: [id number]  Chief Complaint  Patient presents with   Follow-up    Pt complains of hands getting cold and doesn't move. Pt is confused on what inhaler medications she needs to be taking. Poc 2L, continuous 2L.     Referring provider: Irven Coe, MD  HPI:   73 y.o. woman whom are seen in follow up for evaluation of pulmonary hypertension and hypoxemia.  Most recent cardiology note x2 reviewed.    Returns for routine follow-up.  Cough has primarily been chief complaint..  Cough much better now.  Not really an issue.  She is using Flonase as needed for nasal congestion with cold seasonal allergies etc.  As well as antihistamines.  He never started the [facility] that was prescribed in past.  She has some chronic shortness of breath.  Really unchanged.  But she is interested in trying something now.  We reviewed how to use Wixela but given her complications from stroke in the past not confident she can use a dry powder inhaler.  Discussed trying to prescribe DPI, will try Symbicort.  Instructions on how to use provided today.  HPI at initial visit: Patient with longstanding cardiac history including intermittent volume overload.  Atrial fibrillation.  On anticoagulation.  Has longstanding dyspnea.  Shortness of breath.  However, she is been largely wheelchair-bound for a couple years.  Following strokes.  She is on oxygen for 6 months.  This is on discharge from the hospital.  Hospital discharge summary 01/2022 reviewed at the time she was placed on oxygen.  At that time she was diuresed.  TTE estimated elevated PASP.  Breathing about the same.  She reports good adherence to oxygen.  She has not used BiPAP.  She has not used in some time.  Does not use regularly.  Despite rest and the importance of using this.  At last hospitalization she was asked to do this.  She is afraid for a few days and does not use it reliably  and months.  Reviewed most recent chest x-ray 05/2019.  Shows enlarged cardiac silhouette, prominent interstitium suggestive of volume overload on my interpretation.  PMH: ESRD on HD, atrial fibrillation, diabetes, hypothyroidism, CVA Surgical history: Hysterectomy, ankle fracture surgery, Family history: Mother with CAD, hyperlipidemia, hypertension, renal disease, father with CVA Social history: Never smoker, lives in [place] / Pulmonary Flowsheets:   ACT:      No data to display          MMRC:     No data to display          Epworth:      No data to display          Tests:   FENO:  No results found for: "NITRICOXIDE"  PFT:     No data to display          WALK:      No data to display          Imaging: Personally reviewed and as per EMR discussion this note No results found.  Lab Results: Personally reviewed CBC    Component Value Date/Time   WBC 4.7 12/12/2023 0620   RBC 3.15 (L) 12/12/2023 0620   HGB 9.3 (L) 12/12/2023 0620   HGB 11.0 (L) 03/02/2022 1548   HCT 29.9 (L) 12/12/2023 0620   HCT 33.8 (L) 03/02/2022 1548   PLT  122 (L) 12/12/2023 0620   PLT 185 03/02/2022 1548   MCV 94.9 12/12/2023 0620   MCV 88 03/02/2022 1548   MCH 29.5 12/12/2023 0620   MCHC 31.1 12/12/2023 0620   RDW 13.1 12/12/2023 0620   RDW 12.9 03/02/2022 1548   LYMPHSABS 1.7 12/12/2023 0620   LYMPHSABS 0.9 05/21/2019 0947   MONOABS 0.6 12/12/2023 0620   EOSABS 0.1 12/12/2023 0620   EOSABS 0.0 05/21/2019 0947   BASOSABS 0.0 12/12/2023 0620   BASOSABS 0.0 05/21/2019 0947    BMET    Component Value Date/Time   NA 138 12/12/2023 0620   NA 145 (H) 03/02/2022 1548   K 3.9 12/12/2023 0620   CL 102 12/12/2023 0620   CO2 30 12/12/2023 0620   GLUCOSE 326 (H) 12/12/2023 0620   BUN 15 12/12/2023 0620   BUN 13 03/02/2022 1548   CREATININE 1.13 (H) 12/12/2023 0620   CREATININE 1.03 (H) 08/24/2015 1652   CALCIUM 8.5 (L) 12/12/2023 0620    GFRNONAA 52 (L) 12/12/2023 0620   GFRAA >60 06/25/2020 1131    BNP    Component Value Date/Time   BNP 146.6 (H) 12/10/2023 1307    ProBNP No results found for: "PROBNP"  Specialty Problems       Pulmonary Problems   OSA (obstructive sleep apnea)   BiPAP at home Severe Range 05/27/2019: need f/u with sleep clinic in 10 weeks- not yet bringing patients in for in-lab testing for CPAP titration studies, due to the virus pandemic> autoPAP       Acute on chronic respiratory failure with hypoxia (HCC)   Chronic respiratory failure with hypoxia, on home O2 therapy (HCC) - 2 L/min O2   Obesity hypoventilation syndrome (HCC)   Aspiration pneumonia (HCC)    Allergies  Allergen Reactions   Latex Rash    Immunization History  Administered Date(s) Administered   Fluad Quad(high Dose 65+) 11/16/2021   Influenza, High Dose Seasonal PF 10/12/2017   Influenza,inj,Quad PF,6+ Mos 08/20/2013, 09/30/2014, 09/22/2015, 09/24/2016, 12/09/2018, 09/24/2019   Influenza-Unspecified 09/24/2017   PFIZER(Purple Top)SARS-COV-2 Vaccination 06/28/2020   Pneumococcal Conjugate-13 11/24/2016   Pneumococcal Polysaccharide-23 01/25/2012, 06/07/2018   Tdap 01/25/2012   Zoster Recombinant(Shingrix) 04/11/2017    Past Medical History:  Diagnosis Date   Abnormal echocardiogram    Anemia    Arthritis    BPPV (benign paroxysmal positional vertigo), right 06/13/2017   Bradycardia 03/07/2020   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   CHB (complete heart block) (HCC) 03/08/2020   CHF (congestive heart failure) (HCC)    Closed fracture of left proximal humerus 07/08/2019   Decreased range of motion of left shoulder 09/03/2020   Depression    Dizzy 03/07/2020   bradycardia   Edentulous 09/03/2020   GERD (gastroesophageal reflux disease)    Hemiparesis affecting left side as late effect of cerebrovascular accident (CVA) (HCC) 03/08/2020   Hemorrhoids     History of Falls with injury 03/08/2020   Fall resulting in left humeral fracture   Hyperlipidemia    Hypertension    Hypothyroidism    Intracranial vascular stenosis    Lacunar infarction (HCC)    Brain MRI 07/2021 multiple small remote pontine lacunar infarcts   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  Severe chronic microvascular ischemic disease of the pons, left middle cerebellar peduncle, and mini remote lacunar infarcts, progressive since  2019.  Remote pontine microhemorrhage most likely of hypertensive etiology.  10/10/21 CT reveals chronic atrophic changes.     Malignant tumor of thyroid gland (HCC) 03/27/2022   Near syncope 03/08/2020   Paroxysmal atrial fibrillation (HCC)    Presence of permanent cardiac pacemaker    Proteinuria 03/08/2020   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 06/25/20 06/26/2020   Seasonal allergies    Stroke [facility])    Suicidal ideation 11/16/2021   Thyroid cancer (HCC)    per pt S/p Total Thyroidectomy with Radioactive Iodine Therapy   Type 1 diabetes mellitus (HCC)     Tobacco History: Social History   Tobacco Use  Smoking Status Never  Smokeless Tobacco Never   Counseling given: Not Answered   Continue to not smoke  Outpatient Encounter Medications as of 02/18/2024  Medication Sig   acetaminophen (TYLENOL) 650 MG CR tablet Take 650 mg by mouth daily as needed (severe pain).   albuterol (VENTOLIN HFA) 108 (90 Base) MCG/ACT inhaler Inhale 2 puffs into the lungs every 6 (six) hours as needed for wheezing or shortness of breath.   amLODipine (NORVASC) 2.5 MG tablet Take 2.5 mg by mouth daily.   apixaban (ELIQUIS) 5 MG TABS tablet Take 1 tablet (5 mg total) by mouth 2 (two) times daily.   atorvastatin (LIPITOR) 80 MG tablet TAKE 1 TABLET(80 MG) BY MOUTH EVERY EVENING (Patient taking differently: Take 80 mg by mouth  every evening.)   b complex vitamins capsule Take 1 capsule by mouth daily.   Cholecalciferol (VITAMIN D-3 PO) Take 1 capsule by mouth daily.   fluticasone (FLONASE) 50 MCG/ACT nasal spray Place 1 spray into both nostrils daily.   furosemide (LASIX) 40 MG tablet Take 1 tablet (40 mg total) by mouth 2 (two) times daily.   gabapentin (NEURONTIN) 300 MG capsule Take 1 capsule (300 mg total) by mouth as needed. (Patient taking differently: Take 300 mg by mouth 3 (three) times daily.)   HUMALOG 100 UNIT/ML injection Inject 3-5 Units into the skin 3 (three) times daily with meals as needed for high blood sugar.   loratadine (CLARITIN) 10 MG tablet Take 10 mg by mouth daily.   MAGNESIUM-POTASSIUM PO Take 1 each by mouth every evening. Potassium-magnesium combination gummy.   OXYGEN Inhale 2 L into the lungs continuous.   pantoprazole (PROTONIX) 40 MG tablet Take 1 tablet (40 mg total) by mouth 2 (two) times daily.   polyethylene glycol powder (GLYCOLAX/MIRALAX) 17 GM/SCOOP powder Take 17 g by mouth 2 (two) times daily as needed for moderate constipation. (Patient taking differently: Take 17 g by mouth daily.)   SYMBICORT 80-4.5 MCG/ACT inhaler Inhale 2 puffs into the lungs 2 (two) times daily.   TRESIBA FLEXTOUCH 100 UNIT/ML FlexTouch Pen Inject 20 Units into the skin daily.   busPIRone (BUSPAR) 5 MG tablet TAKE 1 TABLET(5 MG) BY MOUTH THREE TIMES DAILY (Patient not taking: Reported on 02/18/2024)   levothyroxine (SYNTHROID) 150 MCG tablet Take 1 tablet (150 mcg total) by mouth daily before breakfast.   losartan (COZAAR) 25 MG tablet Take 1 tablet (25 mg total) by mouth daily.   WIXELA INHUB 500-50 MCG/ACT AEPB Inhale 1 puff into the lungs in the morning and at bedtime. (Patient not taking: Reported on 02/18/2024)   No facility-administered encounter medications on file as of 02/18/2024.     Review of Systems  Review of Systems  N/a Physical Exam  BP 120/60   Pulse 76   Ht 5'  7" (1.702 m)    SpO2 94%   BMI 40.09 kg/m   Wt Readings from Last 5 Encounters:  12/10/23 255 lb 15.3 oz (116.1 kg)  11/20/23 256 lb (116.1 kg)  09/11/23 240 lb 1.3 oz (108.9 kg)  08/07/23 240 lb (108.9 kg)  05/30/23 240 lb (108.9 kg)    BMI Readings from Last 5 Encounters:  02/18/24 40.09 kg/m  12/10/23 48.36 kg/m  11/20/23 40.10 kg/m  09/11/23 37.60 kg/m  08/20/23 37.59 kg/m     Physical Exam General: Sitting in chair, chronically ill-appearing EOMI, no icterus Neck: Supple, JVP difficult to assess given habitus Pulmonary: Clear, normal work of breathing Cardiovascular: Warm, significant lower extremity edema noted Abdomen: Nondistended, bowel sounds present MSK: No synovitis, no joint effusion Neuro: Normal gait, global but symmetric weakness in lower extremities, upper extremities okay Psych: Normal mood, full affect   Assessment & Plan:   Presumed pulmonary hypertension: Based on echocardiogram findings with elevated right-sided pressures.  Likely multifactorial due to group 2 disease given the signs of left-sided volume overload on the chest imaging in the past, group 3 disease in setting of hypoxemia and OSA nonadherent to NIPPV.  Unfortunate she is a very poor candidate for vasodilators given the multifactorial nature of her disease.  She is nonadherent to BiPAP therapy which was explained to her is a foundational treatment of group 3 disease related to OSA.  Currently only using nocturnal oxygen.  Furthermore, she is essentially wheelchair-bound due to prior strokes and weakness.  Do not think she would benefit much in terms of dyspnea with pulmonary vasodilators and given the risk she is a poor candidate.    Chronic hypoxemic respiratory failure: Likely multifactorial related related to OHS, VQ mismatch with presumed pulmonary hypertension, and intermittent volume overload.   Chronic cough: Previously endorsed postnasal drip, nasal congestion.  Addition of Flonase 1 spray each  nostril twice daily as well as antihistamines have led to improvement.   Shortness of breath: Chronic issue.  Largely unchanged.  Will try inhaler to see if it helps.  Not very optimistic it well.  Do not think she can use DPI, coordinate opening lids etc. given prior cerebrovascular event.  New prescription of low-dose Symbicort 2 puff twice daily sent to local pharmacy.  Instructions given on how to use.  OSA: Has not tolerated NIPPV after multiple attempts.  Only using nocturnal oxygen.   No follow-ups on file.   Karren Burly, MD 02/18/2024

## 2024-02-18 NOTE — Patient Instructions (Signed)
 Nice to see you again  Continue albuterol 2 puffs as needed every 4-6 hours  Continue Symbicort 2 puffs twice a day every day, rinse your mouth out with water after every use.  I sent a refill for this and requested a generic if possible  If the Symbicort is too expensive please call us and leave a message and I will look for an alternative  Continue the oxygen, 2 L, at all times.  Take it with you when you leave the house.  Return to clinic in 1 year or sooner as needed with Dr. Judeth Horn

## 2024-02-18 NOTE — Progress Notes (Signed)
 Eyes:  @Patient  ID: [name], female    DOB: 04/22/1951, 73 y.o.   MRN: [id number]  Chief Complaint  Patient presents with   Follow-up    Pt complains of hands getting cold and doesn't move. Pt is confused on what inhaler medications she needs to be taking. Poc 2L, continuous 2L.     Referring provider: Irven Coe, MD  HPI:   73 y.o. woman whom are seen in follow up for evaluation of pulmonary hypertension and hypoxemia.  Most recent discharge summary x2 reviewed.    Returns for routine follow-up.  Cough ok. Wheelchair bound.  Stable oxygen.  Hospitalized twice with low blood sugars.  States has been given new instruction for insulin.  Trying to avoid low sugars.  She endorses good adherence to Symbicort.  Uses albuterol as needed.  Uses her oxygen 2 L at all times.  HPI at initial visit: Patient with longstanding cardiac history including intermittent volume overload.  Atrial fibrillation.  On anticoagulation.  Has longstanding dyspnea.  Shortness of breath.  However, she is been largely wheelchair-bound for a couple years.  Following strokes.  She is on oxygen for 6 months.  This is on discharge from the hospital.  Hospital discharge summary 01/2022 reviewed at the time she was placed on oxygen.  At that time she was diuresed.  TTE estimated elevated PASP.  Breathing about the same.  She reports good adherence to oxygen.  She has not used BiPAP.  She has not used in some time.  Does not use regularly.  Despite rest and the importance of using this.  At last hospitalization she was asked to do this.  She is afraid for a few days and does not use it reliably and months.  Reviewed most recent chest x-ray 05/2019.  Shows enlarged cardiac silhouette, prominent interstitium suggestive of volume overload on my interpretation.  PMH: ESRD on HD, atrial fibrillation, diabetes, hypothyroidism, CVA Surgical history: Hysterectomy, ankle fracture surgery, Family history: Mother with  CAD, hyperlipidemia, hypertension, renal disease, father with CVA Social history: Never smoker, lives in [place] / Pulmonary Flowsheets:   ACT:      No data to display          MMRC:     No data to display          Epworth:      No data to display          Tests:   FENO:  No results found for: "NITRICOXIDE"  PFT:     No data to display          WALK:      No data to display          Imaging: Personally reviewed and as per EMR discussion this note No results found.  Lab Results: Personally reviewed CBC    Component Value Date/Time   WBC 4.7 12/12/2023 0620   RBC 3.15 (L) 12/12/2023 0620   HGB 9.3 (L) 12/12/2023 0620   HGB 11.0 (L) 03/02/2022 1548   HCT 29.9 (L) 12/12/2023 0620   HCT 33.8 (L) 03/02/2022 1548   PLT 122 (L) 12/12/2023 0620   PLT 185 03/02/2022 1548   MCV 94.9 12/12/2023 0620   MCV 88 03/02/2022 1548   MCH 29.5 12/12/2023 0620   MCHC 31.1 12/12/2023 0620   RDW 13.1 12/12/2023 0620   RDW 12.9 03/02/2022 1548   LYMPHSABS 1.7 12/12/2023 0620   LYMPHSABS 0.9 05/21/2019 0947  MONOABS 0.6 12/12/2023 0620   EOSABS 0.1 12/12/2023 0620   EOSABS 0.0 05/21/2019 0947   BASOSABS 0.0 12/12/2023 0620   BASOSABS 0.0 05/21/2019 0947    BMET    Component Value Date/Time   NA 138 12/12/2023 0620   NA 145 (H) 03/02/2022 1548   K 3.9 12/12/2023 0620   CL 102 12/12/2023 0620   CO2 30 12/12/2023 0620   GLUCOSE 326 (H) 12/12/2023 0620   BUN 15 12/12/2023 0620   BUN 13 03/02/2022 1548   CREATININE 1.13 (H) 12/12/2023 0620   CREATININE 1.03 (H) 08/24/2015 1652   CALCIUM 8.5 (L) 12/12/2023 0620   GFRNONAA 52 (L) 12/12/2023 0620   GFRAA >60 06/25/2020 1131    BNP    Component Value Date/Time   BNP 146.6 (H) 12/10/2023 1307    ProBNP No results found for: "PROBNP"  Specialty Problems       Pulmonary Problems   OSA (obstructive sleep apnea)   BiPAP at home Severe Range 05/27/2019: need f/u with sleep  clinic in 10 weeks- not yet bringing patients in for in-lab testing for CPAP titration studies, due to the virus pandemic> autoPAP       Acute on chronic respiratory failure with hypoxia (HCC)   Chronic respiratory failure with hypoxia, on home O2 therapy (HCC) - 2 L/min O2   Obesity hypoventilation syndrome (HCC)   Aspiration pneumonia (HCC)    Allergies  Allergen Reactions   Latex Rash    Immunization History  Administered Date(s) Administered   Fluad Quad(high Dose 65+) 11/16/2021   Influenza, High Dose Seasonal PF 10/12/2017   Influenza,inj,Quad PF,6+ Mos 08/20/2013, 09/30/2014, 09/22/2015, 09/24/2016, 12/09/2018, 09/24/2019   Influenza-Unspecified 09/24/2017   PFIZER(Purple Top)SARS-COV-2 Vaccination 06/28/2020   Pneumococcal Conjugate-13 11/24/2016   Pneumococcal Polysaccharide-23 01/25/2012, 06/07/2018   Tdap 01/25/2012   Zoster Recombinant(Shingrix) 04/11/2017    Past Medical History:  Diagnosis Date   Abnormal echocardiogram    Anemia    Arthritis    BPPV (benign paroxysmal positional vertigo), right 06/13/2017   Bradycardia 03/07/2020   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   CHB (complete heart block) (HCC) 03/08/2020   CHF (congestive heart failure) (HCC)    Closed fracture of left proximal humerus 07/08/2019   Decreased range of motion of left shoulder 09/03/2020   Depression    Dizzy 03/07/2020   bradycardia   Edentulous 09/03/2020   GERD (gastroesophageal reflux disease)    Hemiparesis affecting left side as late effect of cerebrovascular accident (CVA) (HCC) 03/08/2020   Hemorrhoids    History of Falls with injury 03/08/2020   Fall resulting in left humeral fracture   Hyperlipidemia    Hypertension    Hypothyroidism    Intracranial vascular stenosis    Lacunar infarction (HCC)    Brain MRI 07/2021 multiple small remote pontine lacunar infarcts   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  Severe chronic microvascular ischemic disease of the pons, left middle cerebellar peduncle, and mini remote lacunar infarcts, progressive since 2019.  Remote pontine microhemorrhage most likely of hypertensive etiology.  10/10/21 CT reveals chronic atrophic changes.     Malignant tumor of thyroid gland (HCC) 03/27/2022   Near syncope 03/08/2020   Paroxysmal atrial fibrillation (HCC)    Presence of permanent cardiac pacemaker    Proteinuria 03/08/2020   S/P placement of cardiac pacemaker [employer]onic  Azure XT DR MRI 06/25/20 06/26/2020   Seasonal allergies    Stroke [facility])    Suicidal ideation 11/16/2021   Thyroid cancer (HCC)    per pt S/p Total Thyroidectomy with Radioactive Iodine Therapy   Type 1 diabetes mellitus (HCC)     Tobacco History: Social History   Tobacco Use  Smoking Status Never  Smokeless Tobacco Never   Counseling given: Not Answered   Continue to not smoke  Outpatient Encounter Medications as of 02/18/2024  Medication Sig   acetaminophen (TYLENOL) 650 MG CR tablet Take 650 mg by mouth daily as needed (severe pain).   amLODipine (NORVASC) 2.5 MG tablet Take 2.5 mg by mouth daily.   apixaban (ELIQUIS) 5 MG TABS tablet Take 1 tablet (5 mg total) by mouth 2 (two) times daily.   atorvastatin (LIPITOR) 80 MG tablet TAKE 1 TABLET(80 MG) BY MOUTH EVERY EVENING (Patient taking differently: Take 80 mg by mouth every evening.)   b complex vitamins capsule Take 1 capsule by mouth daily.   Cholecalciferol (VITAMIN D-3 PO) Take 1 capsule by mouth daily.   fluticasone (FLONASE) 50 MCG/ACT nasal spray Place 1 spray into both nostrils daily.   furosemide (LASIX) 40 MG tablet Take 1 tablet (40 mg total) by mouth 2 (two) times daily.   gabapentin (NEURONTIN) 300 MG capsule Take 1 capsule (300 mg total) by mouth as needed. (Patient taking differently: Take 300 mg by mouth 3 (three) times  daily.)   HUMALOG 100 UNIT/ML injection Inject 3-5 Units into the skin 3 (three) times daily with meals as needed for high blood sugar.   loratadine (CLARITIN) 10 MG tablet Take 10 mg by mouth daily.   MAGNESIUM-POTASSIUM PO Take 1 each by mouth every evening. Potassium-magnesium combination gummy.   OXYGEN Inhale 2 L into the lungs continuous.   pantoprazole (PROTONIX) 40 MG tablet Take 1 tablet (40 mg total) by mouth 2 (two) times daily.   polyethylene glycol powder (GLYCOLAX/MIRALAX) 17 GM/SCOOP powder Take 17 g by mouth 2 (two) times daily as needed for moderate constipation. (Patient taking differently: Take 17 g by mouth daily.)   TRESIBA FLEXTOUCH 100 UNIT/ML FlexTouch Pen Inject 20 Units into the skin daily.   [DISCONTINUED] albuterol (VENTOLIN HFA) 108 (90 Base) MCG/ACT inhaler Inhale 2 puffs into the lungs every 6 (six) hours as needed for wheezing or shortness of breath.   [DISCONTINUED] SYMBICORT 80-4.5 MCG/ACT inhaler Inhale 2 puffs into the lungs 2 (two) times daily.   albuterol (VENTOLIN HFA) 108 (90 Base) MCG/ACT inhaler Inhale 2 puffs into the lungs every 6 (six) hours as needed for wheezing or shortness of breath.   budesonide-formoterol (SYMBICORT) 80-4.5 MCG/ACT inhaler Inhale 2 puffs into the lungs 2 (two) times daily.   busPIRone (BUSPAR) 5 MG tablet TAKE 1 TABLET(5 MG) BY MOUTH THREE TIMES DAILY (Patient not taking: Reported on 02/18/2024)   levothyroxine (SYNTHROID) 150 MCG tablet Take 1 tablet (150 mcg total) by mouth daily before breakfast.   losartan (COZAAR) 25 MG tablet Take 1 tablet (25 mg total) by mouth daily.   WIXELA INHUB 500-50 MCG/ACT AEPB Inhale 1 puff into the lungs in the morning and at bedtime. (Patient not taking: Reported on 02/18/2024)   [DISCONTINUED] SYMBICORT 80-4.5 MCG/ACT inhaler Inhale 2 puffs into the lungs 2 (two) times daily.   No facility-administered encounter medications on file as of 02/18/2024.     Review of Systems  Review of Systems   N/a Physical Exam  BP 120/60   Pulse  76   Ht 5\' 7"  (1.702 m)   SpO2 94%   BMI 40.09 kg/m   Wt Readings from Last 5 Encounters:  12/10/23 255 lb 15.3 oz (116.1 kg)  11/20/23 256 lb (116.1 kg)  09/11/23 240 lb 1.3 oz (108.9 kg)  08/07/23 240 lb (108.9 kg)  05/30/23 240 lb (108.9 kg)    BMI Readings from Last 5 Encounters:  02/18/24 40.09 kg/m  12/10/23 48.36 kg/m  11/20/23 40.10 kg/m  09/11/23 37.60 kg/m  08/20/23 37.59 kg/m     Physical Exam General: Sitting in chair, chronically ill-appearing EOMI, no icterus Neck: Supple, JVP difficult to assess given habitus Pulmonary: Clear, normal work of breathing Cardiovascular: Warm, significant lower extremity edema noted Abdomen: Nondistended, bowel sounds present MSK: No synovitis, no joint effusion Neuro: Normal gait, global but symmetric weakness in lower extremities, upper extremities okay Psych: Normal mood, full affect   Assessment & Plan:   Presumed pulmonary hypertension: Based on echocardiogram findings with elevated right-sided pressures.  Likely multifactorial due to group 2 disease given the signs of left-sided volume overload on the chest imaging in the past, group 3 disease in setting of hypoxemia and OSA nonadherent to NIPPV.  Unfortunate she is a very poor candidate for vasodilators given the multifactorial nature of her disease.  She is nonadherent to BiPAP therapy which was explained to her is a foundational treatment of group 3 disease related to OSA.  Currently only using nocturnal oxygen.  Furthermore, she is essentially wheelchair-bound due to prior strokes and weakness.  Do not think she would benefit much in terms of dyspnea with pulmonary vasodilators and given the risk she is a poor candidate.    Chronic hypoxemic respiratory failure: Likely multifactorial related related to OHS, VQ mismatch with presumed pulmonary hypertension, and intermittent volume overload.  Continue 2 L at all  times.  Shortness of breath: Chronic issue.  Largely unchanged.  Do not think she can use DPI, coordinate opening lids etc. given prior cerebrovascular event.  Symbicort, albuterol refilled.  OSA: Has not tolerated NIPPV after multiple attempts.  Only using nocturnal oxygen.   Return in about 1 year (around 02/17/2025) for f/u Dr. Judeth Horn.   Karren Burly, MD 02/18/2024

## 2024-02-21 DIAGNOSIS — K5900 Constipation, unspecified: Secondary | ICD-10-CM | POA: Diagnosis not present

## 2024-02-21 DIAGNOSIS — M545 Low back pain, unspecified: Secondary | ICD-10-CM | POA: Diagnosis not present

## 2024-02-21 DIAGNOSIS — K219 Gastro-esophageal reflux disease without esophagitis: Secondary | ICD-10-CM | POA: Diagnosis not present

## 2024-02-21 DIAGNOSIS — D649 Anemia, unspecified: Secondary | ICD-10-CM | POA: Diagnosis not present

## 2024-02-21 DIAGNOSIS — I110 Hypertensive heart disease with heart failure: Secondary | ICD-10-CM | POA: Diagnosis not present

## 2024-02-21 DIAGNOSIS — L89622 Pressure ulcer of left heel, stage 2: Secondary | ICD-10-CM | POA: Diagnosis not present

## 2024-02-21 DIAGNOSIS — G8929 Other chronic pain: Secondary | ICD-10-CM | POA: Diagnosis not present

## 2024-02-21 DIAGNOSIS — K649 Unspecified hemorrhoids: Secondary | ICD-10-CM | POA: Diagnosis not present

## 2024-02-21 DIAGNOSIS — H8110 Benign paroxysmal vertigo, unspecified ear: Secondary | ICD-10-CM | POA: Diagnosis not present

## 2024-02-21 DIAGNOSIS — I272 Pulmonary hypertension, unspecified: Secondary | ICD-10-CM | POA: Diagnosis not present

## 2024-02-21 DIAGNOSIS — I480 Paroxysmal atrial fibrillation: Secondary | ICD-10-CM | POA: Diagnosis not present

## 2024-02-21 DIAGNOSIS — I442 Atrioventricular block, complete: Secondary | ICD-10-CM | POA: Diagnosis not present

## 2024-02-21 DIAGNOSIS — E10621 Type 1 diabetes mellitus with foot ulcer: Secondary | ICD-10-CM | POA: Diagnosis not present

## 2024-02-21 DIAGNOSIS — J9621 Acute and chronic respiratory failure with hypoxia: Secondary | ICD-10-CM | POA: Diagnosis not present

## 2024-02-21 DIAGNOSIS — E7800 Pure hypercholesterolemia, unspecified: Secondary | ICD-10-CM | POA: Diagnosis not present

## 2024-02-21 DIAGNOSIS — I69354 Hemiplegia and hemiparesis following cerebral infarction affecting left non-dominant side: Secondary | ICD-10-CM | POA: Diagnosis not present

## 2024-02-21 DIAGNOSIS — M1990 Unspecified osteoarthritis, unspecified site: Secondary | ICD-10-CM | POA: Diagnosis not present

## 2024-02-21 DIAGNOSIS — G3184 Mild cognitive impairment, so stated: Secondary | ICD-10-CM | POA: Diagnosis not present

## 2024-02-21 DIAGNOSIS — I5032 Chronic diastolic (congestive) heart failure: Secondary | ICD-10-CM | POA: Diagnosis not present

## 2024-02-21 DIAGNOSIS — E890 Postprocedural hypothyroidism: Secondary | ICD-10-CM | POA: Diagnosis not present

## 2024-02-25 DIAGNOSIS — M545 Low back pain, unspecified: Secondary | ICD-10-CM | POA: Diagnosis not present

## 2024-02-25 DIAGNOSIS — E10621 Type 1 diabetes mellitus with foot ulcer: Secondary | ICD-10-CM | POA: Diagnosis not present

## 2024-02-25 DIAGNOSIS — I480 Paroxysmal atrial fibrillation: Secondary | ICD-10-CM | POA: Diagnosis not present

## 2024-02-25 DIAGNOSIS — I5032 Chronic diastolic (congestive) heart failure: Secondary | ICD-10-CM | POA: Diagnosis not present

## 2024-02-25 DIAGNOSIS — G8929 Other chronic pain: Secondary | ICD-10-CM | POA: Diagnosis not present

## 2024-02-25 DIAGNOSIS — M1990 Unspecified osteoarthritis, unspecified site: Secondary | ICD-10-CM | POA: Diagnosis not present

## 2024-02-25 DIAGNOSIS — J9621 Acute and chronic respiratory failure with hypoxia: Secondary | ICD-10-CM | POA: Diagnosis not present

## 2024-02-25 DIAGNOSIS — D649 Anemia, unspecified: Secondary | ICD-10-CM | POA: Diagnosis not present

## 2024-02-25 DIAGNOSIS — I110 Hypertensive heart disease with heart failure: Secondary | ICD-10-CM | POA: Diagnosis not present

## 2024-02-25 DIAGNOSIS — I272 Pulmonary hypertension, unspecified: Secondary | ICD-10-CM | POA: Diagnosis not present

## 2024-02-25 DIAGNOSIS — I442 Atrioventricular block, complete: Secondary | ICD-10-CM | POA: Diagnosis not present

## 2024-02-25 DIAGNOSIS — E7800 Pure hypercholesterolemia, unspecified: Secondary | ICD-10-CM | POA: Diagnosis not present

## 2024-02-25 DIAGNOSIS — H8110 Benign paroxysmal vertigo, unspecified ear: Secondary | ICD-10-CM | POA: Diagnosis not present

## 2024-02-25 DIAGNOSIS — K219 Gastro-esophageal reflux disease without esophagitis: Secondary | ICD-10-CM | POA: Diagnosis not present

## 2024-02-25 DIAGNOSIS — E890 Postprocedural hypothyroidism: Secondary | ICD-10-CM | POA: Diagnosis not present

## 2024-02-25 DIAGNOSIS — G3184 Mild cognitive impairment, so stated: Secondary | ICD-10-CM | POA: Diagnosis not present

## 2024-02-25 DIAGNOSIS — K649 Unspecified hemorrhoids: Secondary | ICD-10-CM | POA: Diagnosis not present

## 2024-02-25 DIAGNOSIS — I69354 Hemiplegia and hemiparesis following cerebral infarction affecting left non-dominant side: Secondary | ICD-10-CM | POA: Diagnosis not present

## 2024-02-25 DIAGNOSIS — K5900 Constipation, unspecified: Secondary | ICD-10-CM | POA: Diagnosis not present

## 2024-02-25 DIAGNOSIS — L89622 Pressure ulcer of left heel, stage 2: Secondary | ICD-10-CM | POA: Diagnosis not present

## 2024-02-26 DIAGNOSIS — E119 Type 2 diabetes mellitus without complications: Secondary | ICD-10-CM | POA: Diagnosis not present

## 2024-02-26 DIAGNOSIS — I69354 Hemiplegia and hemiparesis following cerebral infarction affecting left non-dominant side: Secondary | ICD-10-CM | POA: Diagnosis not present

## 2024-03-03 DIAGNOSIS — N390 Urinary tract infection, site not specified: Secondary | ICD-10-CM | POA: Diagnosis not present

## 2024-03-04 DIAGNOSIS — I110 Hypertensive heart disease with heart failure: Secondary | ICD-10-CM | POA: Diagnosis not present

## 2024-03-04 DIAGNOSIS — J9621 Acute and chronic respiratory failure with hypoxia: Secondary | ICD-10-CM | POA: Diagnosis not present

## 2024-03-04 DIAGNOSIS — I69354 Hemiplegia and hemiparesis following cerebral infarction affecting left non-dominant side: Secondary | ICD-10-CM | POA: Diagnosis not present

## 2024-03-04 DIAGNOSIS — E890 Postprocedural hypothyroidism: Secondary | ICD-10-CM | POA: Diagnosis not present

## 2024-03-04 DIAGNOSIS — H8110 Benign paroxysmal vertigo, unspecified ear: Secondary | ICD-10-CM | POA: Diagnosis not present

## 2024-03-04 DIAGNOSIS — G3184 Mild cognitive impairment, so stated: Secondary | ICD-10-CM | POA: Diagnosis not present

## 2024-03-04 DIAGNOSIS — K649 Unspecified hemorrhoids: Secondary | ICD-10-CM | POA: Diagnosis not present

## 2024-03-04 DIAGNOSIS — I480 Paroxysmal atrial fibrillation: Secondary | ICD-10-CM | POA: Diagnosis not present

## 2024-03-04 DIAGNOSIS — I272 Pulmonary hypertension, unspecified: Secondary | ICD-10-CM | POA: Diagnosis not present

## 2024-03-04 DIAGNOSIS — K5900 Constipation, unspecified: Secondary | ICD-10-CM | POA: Diagnosis not present

## 2024-03-04 DIAGNOSIS — M545 Low back pain, unspecified: Secondary | ICD-10-CM | POA: Diagnosis not present

## 2024-03-04 DIAGNOSIS — K219 Gastro-esophageal reflux disease without esophagitis: Secondary | ICD-10-CM | POA: Diagnosis not present

## 2024-03-04 DIAGNOSIS — E10621 Type 1 diabetes mellitus with foot ulcer: Secondary | ICD-10-CM | POA: Diagnosis not present

## 2024-03-04 DIAGNOSIS — G8929 Other chronic pain: Secondary | ICD-10-CM | POA: Diagnosis not present

## 2024-03-04 DIAGNOSIS — I442 Atrioventricular block, complete: Secondary | ICD-10-CM | POA: Diagnosis not present

## 2024-03-04 DIAGNOSIS — E7800 Pure hypercholesterolemia, unspecified: Secondary | ICD-10-CM | POA: Diagnosis not present

## 2024-03-04 DIAGNOSIS — M1990 Unspecified osteoarthritis, unspecified site: Secondary | ICD-10-CM | POA: Diagnosis not present

## 2024-03-04 DIAGNOSIS — I5032 Chronic diastolic (congestive) heart failure: Secondary | ICD-10-CM | POA: Diagnosis not present

## 2024-03-04 DIAGNOSIS — L89622 Pressure ulcer of left heel, stage 2: Secondary | ICD-10-CM | POA: Diagnosis not present

## 2024-03-04 DIAGNOSIS — D649 Anemia, unspecified: Secondary | ICD-10-CM | POA: Diagnosis not present

## 2024-03-05 ENCOUNTER — Telehealth: Payer: Self-pay

## 2024-03-05 DIAGNOSIS — I69354 Hemiplegia and hemiparesis following cerebral infarction affecting left non-dominant side: Secondary | ICD-10-CM

## 2024-03-05 NOTE — Patient Outreach (Signed)
 Submitted faxed referral from [facility].   [facility]  Population Health Care Management Assistant  Direct Dial: [phone]  Fax: [phone] Website: [email or web address]

## 2024-03-06 ENCOUNTER — Telehealth: Payer: Self-pay | Admitting: *Deleted

## 2024-03-06 NOTE — Progress Notes (Signed)
 Complex Care Management Note  Care Guide Note 03/06/2024 Name: Abrie Egloff MRN: [id number] DOB: 07/25/1951  Sheria Lang Inette Doubrava is a 73 y.o. year old female who sees Irven Coe, MD for primary care. I reached out to [name] by phone today to offer complex care management services.  Ms. Faerber was given information about Complex Care Management services today including:   The Complex Care Management services include support from the care team which includes your Nurse Care Manager, Clinical Social Worker, or Pharmacist.  The Complex Care Management team is here to help remove barriers to the health concerns and goals most important to you. Complex Care Management services are voluntary, and the patient may decline or stop services at any time by request to their care team member.   Complex Care Management Consent Status: Patient agreed to services and verbal consent obtained.   Follow up plan:  Telephone appointment with complex care management team member scheduled for:  3/14  Encounter Outcome:  Patient Scheduled  Gwenevere Ghazi  [facility]  [facility], [facility] Guide  Direct Dial: [phone]  Fax [phone]

## 2024-03-06 NOTE — Progress Notes (Signed)
 Complex Care Management Note Care Guide Note  03/06/2024 Name: Cady Hafen MRN: [id number] DOB: 1951-10-21   Complex Care Management Outreach Attempts: An unsuccessful telephone outreach was attempted today to offer the patient information about available complex care management services.  Follow Up Plan:  Additional outreach attempts will be made to offer the patient complex care management information and services.   Encounter Outcome:  No Answer  Gwenevere Ghazi  [facility]  [facility], [facility] Guide  Direct Dial: [phone]  Fax [phone]

## 2024-03-07 ENCOUNTER — Ambulatory Visit: Payer: Self-pay | Admitting: Licensed Clinical Social Worker

## 2024-03-07 NOTE — Patient Instructions (Signed)
 Visit Information  Thank you for taking time to visit with me today. Please don't hesitate to contact me if I can be of assistance to you.   Following are the goals we discussed today:   Goals Addressed             This Visit's Progress    Care Coordination       Care Coordination Interventions: Assessed Social Determinants of Health Reviewed all upcoming appointments in Epic system Motivational Interviewing employed Active listening / Reflection utilized  Emotional Support Provided Problem Solving /Task Center strategies reviewed Assessed needs and reviewed facility placement process; as well as the different levels of care Reviewed facility placement process :Pt is living with sister currently until placement can occur. Niece plans on reaching out to facilities for possible admission  Provided a list of facilities based on level of care needs Discussed payment options for placement :pt plans on private pay for ALF placement.          Our next appointment is by telephone on 03/21/2024.  Please call the care guide team at [phone] if you need to cancel or reschedule your appointment.   If you are experiencing a Mental Health or Behavioral Health Crisis or need someone to talk to, please call the Suicide and Crisis Lifeline: 988  Patient verbalizes understanding of instructions and care plan provided today and agrees to view in MyChart. Active MyChart status and patient understanding of how to access instructions and care plan via MyChart confirmed with patient.     Telephone follow up appointment with care management team member scheduled for: 03/21/2024

## 2024-03-07 NOTE — Patient Outreach (Signed)
 Care Coordination   Initial Visit Note   03/07/2024 Name: [name] MRN: [id number] DOB: 17-Jul-1951  [name] is a 73 y.o. year old female who sees [name] Coe, MD for primary care. I spoke with  [name] by phone today.  What matters to the patients health and wellness today?  Working towards admission to an ALF.    Goals Addressed             This Visit's Progress    Care Coordination       Care Coordination Interventions: Assessed Social Determinants of Health Reviewed all upcoming appointments in Epic system Motivational Interviewing employed Active listening / Reflection utilized  Emotional Support Provided Problem Solving /Task Center strategies reviewed Assessed needs and reviewed facility placement process; as well as the different levels of care Reviewed facility placement process :Pt is living with sister currently until placement can occur. Niece plans on reaching out to facilities for possible admission  Provided a list of facilities based on level of care needs Discussed payment options for placement :pt plans on private pay for ALF placement.          SDOH assessments and interventions completed:  Yes  SDOH Interventions Today    Flowsheet Row Most Recent Value  SDOH Interventions   Food Insecurity Interventions Intervention Not Indicated  Housing Interventions Intervention Not Indicated  Transportation Interventions Intervention Not Indicated  Utilities Interventions Intervention Not Indicated        Care Coordination Interventions:  Yes, provided   Interventions Today    Flowsheet Row Most Recent Value  Chronic Disease   Chronic disease during today's visit Other  [ALF Placement]  General Interventions   General Interventions Discussed/Reviewed Level of Care, General Interventions Discussed, [name] is currently living with sister and another family member until she can obtain  placement in ALF. Pt plans on private pay for ALF placement - we reviewed financial rules for SA. Neice will be assisting with placement process,CSW will mail list of ALF's]  Level of Care Assisted Living  [We reviewed pt's care needs - pt is wheelchair bound and requires help with transfers and most ADLs. We discussed possible private IHA services, CSW will send list of possible options.]        Follow up plan: Follow up call scheduled for 03/21/2024    Encounter Outcome:  Patient Visit Completed   [name] Kingfisher, LCSW [name], [facility] Licensed Clinical Social Worker Care Coordinator [phone]

## 2024-03-10 ENCOUNTER — Telehealth: Payer: Self-pay | Admitting: Licensed Clinical Social Worker

## 2024-03-10 DIAGNOSIS — D649 Anemia, unspecified: Secondary | ICD-10-CM | POA: Diagnosis not present

## 2024-03-10 DIAGNOSIS — E7800 Pure hypercholesterolemia, unspecified: Secondary | ICD-10-CM | POA: Diagnosis not present

## 2024-03-10 DIAGNOSIS — E890 Postprocedural hypothyroidism: Secondary | ICD-10-CM | POA: Diagnosis not present

## 2024-03-10 DIAGNOSIS — M545 Low back pain, unspecified: Secondary | ICD-10-CM | POA: Diagnosis not present

## 2024-03-10 DIAGNOSIS — K649 Unspecified hemorrhoids: Secondary | ICD-10-CM | POA: Diagnosis not present

## 2024-03-10 DIAGNOSIS — E10621 Type 1 diabetes mellitus with foot ulcer: Secondary | ICD-10-CM | POA: Diagnosis not present

## 2024-03-10 DIAGNOSIS — M1990 Unspecified osteoarthritis, unspecified site: Secondary | ICD-10-CM | POA: Diagnosis not present

## 2024-03-10 DIAGNOSIS — I442 Atrioventricular block, complete: Secondary | ICD-10-CM | POA: Diagnosis not present

## 2024-03-10 DIAGNOSIS — I272 Pulmonary hypertension, unspecified: Secondary | ICD-10-CM | POA: Diagnosis not present

## 2024-03-10 DIAGNOSIS — L89622 Pressure ulcer of left heel, stage 2: Secondary | ICD-10-CM | POA: Diagnosis not present

## 2024-03-10 DIAGNOSIS — I5032 Chronic diastolic (congestive) heart failure: Secondary | ICD-10-CM | POA: Diagnosis not present

## 2024-03-10 DIAGNOSIS — I480 Paroxysmal atrial fibrillation: Secondary | ICD-10-CM | POA: Diagnosis not present

## 2024-03-10 DIAGNOSIS — K219 Gastro-esophageal reflux disease without esophagitis: Secondary | ICD-10-CM | POA: Diagnosis not present

## 2024-03-10 DIAGNOSIS — I110 Hypertensive heart disease with heart failure: Secondary | ICD-10-CM | POA: Diagnosis not present

## 2024-03-10 DIAGNOSIS — J9621 Acute and chronic respiratory failure with hypoxia: Secondary | ICD-10-CM | POA: Diagnosis not present

## 2024-03-10 DIAGNOSIS — K5900 Constipation, unspecified: Secondary | ICD-10-CM | POA: Diagnosis not present

## 2024-03-10 DIAGNOSIS — G3184 Mild cognitive impairment, so stated: Secondary | ICD-10-CM | POA: Diagnosis not present

## 2024-03-10 DIAGNOSIS — G8929 Other chronic pain: Secondary | ICD-10-CM | POA: Diagnosis not present

## 2024-03-10 DIAGNOSIS — I69354 Hemiplegia and hemiparesis following cerebral infarction affecting left non-dominant side: Secondary | ICD-10-CM | POA: Diagnosis not present

## 2024-03-10 DIAGNOSIS — H8110 Benign paroxysmal vertigo, unspecified ear: Secondary | ICD-10-CM | POA: Diagnosis not present

## 2024-03-10 NOTE — Patient Outreach (Signed)
 CSW contacted pt over the phone after receiving message from care guide with phone call request. Pt reported that she was concerned the prices of [facility] ALF's would be too high, CSW agreed to send list of ALF's in [place] and [place]. Pt was agreeable to keep schedule appointment on 3/18.  Kenton Kingfisher, LCSW [place]/[facility], [facility] Licensed Clinical Social Worker Care Coordinator [phone]

## 2024-03-13 ENCOUNTER — Ambulatory Visit: Payer: Self-pay | Admitting: Licensed Clinical Social Worker

## 2024-03-13 NOTE — Patient Outreach (Signed)
 Care Coordination   Follow Up Visit Note   03/13/2024 Name: [name] MRN: [id number] DOB: [birth year]  [name] is a 73 y.o. year old female who sees Irven Coe, MD for primary care. I spoke with  [name] by phone today.  What matters to the patients health and wellness today?  Accessing ALF for long-term placement.    Goals Addressed             This Visit's Progress    Care Coordination       Care Coordination Interventions: Assessed Social Determinants of Health Reviewed all upcoming appointments in Epic system Motivational Interviewing employed Active listening / Reflection utilized  Emotional Support Provided Problem Solving /Task Center strategies reviewed Assessed needs and reviewed facility placement process; as well as the different levels of care Reviewed facility placement process :Pt is living with sister currently until placement can occur. Niece plans on reaching out to facilities for possible admission  Provided a list of facilities based on level of care needs Discussed payment options for placement :pt plans on private pay for ALF placement.           SDOH assessments and interventions completed:  Yes     Care Coordination Interventions:  Yes, provided   Interventions Today    Flowsheet Row Most Recent Value  Chronic Disease   Chronic disease during today's visit Other  [Placement]  General Interventions   General Interventions Discussed/Reviewed Community Resources, Level of Care, General Interventions Discussed  [CSW spoke with patient after receiving call from provider office. Patient had some confusion about placement process. CSW spoke with patient and she was concerned about paying for ALF,she has also not heard back from some ALFs.CSW spoke with [facility]]  Level of Care Assisted Living  [CSW assisted pt with contacting [facility] [place], pt and Neice will do a tour of facility next  Monday. CSW requested facility fax FL-2 request to provider office for pt appt tomorrow. CSW contacted provider office and updated them.]  Education Interventions   Education Provided Provided Education  [Pt was also concerned about overall finances of going to an ALF and was willing to explore more in home aid agencies. Pt reported the onces previously sent were too expensive, CSW sent additional options for pt to review.]        Follow up plan: Follow up call scheduled for 03/21/2024    Encounter Outcome:  Patient Visit Completed   Kenton Kingfisher, LCSW [place]/[facility], [facility] Licensed Clinical Social Worker Care Coordinator [phone]

## 2024-03-13 NOTE — Patient Instructions (Signed)
 Visit Information  Thank you for taking time to visit with me today. Please don't hesitate to contact me if I can be of assistance to you.   Following are the goals we discussed today:   Goals Addressed             This Visit's Progress    Care Coordination       Care Coordination Interventions: Assessed Social Determinants of Health Reviewed all upcoming appointments in Epic system Motivational Interviewing employed Active listening / Reflection utilized  Emotional Support Provided Problem Solving /Task Center strategies reviewed Assessed needs and reviewed facility placement process; as well as the different levels of care Reviewed facility placement process :Pt is living with sister currently until placement can occur. Niece plans on reaching out to facilities for possible admission  Provided a list of facilities based on level of care needs Discussed payment options for placement :pt plans on private pay for ALF placement.          Our next appointment is by telephone on 03/21/2024.  Please call the care guide team at [phone] if you need to cancel or reschedule your appointment.   If you are experiencing a Mental Health or Behavioral Health Crisis or need someone to talk to, please call the Suicide and Crisis Lifeline: 988  Patient verbalizes understanding of instructions and care plan provided today and agrees to view in MyChart. Active MyChart status and patient understanding of how to access instructions and care plan via MyChart confirmed with patient.     Telephone follow up appointment with care management team member scheduled for: 03/21/2024

## 2024-03-14 DIAGNOSIS — R1310 Dysphagia, unspecified: Secondary | ICD-10-CM | POA: Diagnosis not present

## 2024-03-17 DIAGNOSIS — D649 Anemia, unspecified: Secondary | ICD-10-CM | POA: Diagnosis not present

## 2024-03-17 DIAGNOSIS — G3184 Mild cognitive impairment, so stated: Secondary | ICD-10-CM | POA: Diagnosis not present

## 2024-03-17 DIAGNOSIS — K219 Gastro-esophageal reflux disease without esophagitis: Secondary | ICD-10-CM | POA: Diagnosis not present

## 2024-03-17 DIAGNOSIS — I69354 Hemiplegia and hemiparesis following cerebral infarction affecting left non-dominant side: Secondary | ICD-10-CM | POA: Diagnosis not present

## 2024-03-17 DIAGNOSIS — M545 Low back pain, unspecified: Secondary | ICD-10-CM | POA: Diagnosis not present

## 2024-03-17 DIAGNOSIS — I5032 Chronic diastolic (congestive) heart failure: Secondary | ICD-10-CM | POA: Diagnosis not present

## 2024-03-17 DIAGNOSIS — J9621 Acute and chronic respiratory failure with hypoxia: Secondary | ICD-10-CM | POA: Diagnosis not present

## 2024-03-17 DIAGNOSIS — I480 Paroxysmal atrial fibrillation: Secondary | ICD-10-CM | POA: Diagnosis not present

## 2024-03-17 DIAGNOSIS — K5900 Constipation, unspecified: Secondary | ICD-10-CM | POA: Diagnosis not present

## 2024-03-17 DIAGNOSIS — K649 Unspecified hemorrhoids: Secondary | ICD-10-CM | POA: Diagnosis not present

## 2024-03-17 DIAGNOSIS — L89622 Pressure ulcer of left heel, stage 2: Secondary | ICD-10-CM | POA: Diagnosis not present

## 2024-03-17 DIAGNOSIS — E890 Postprocedural hypothyroidism: Secondary | ICD-10-CM | POA: Diagnosis not present

## 2024-03-17 DIAGNOSIS — I110 Hypertensive heart disease with heart failure: Secondary | ICD-10-CM | POA: Diagnosis not present

## 2024-03-17 DIAGNOSIS — E7800 Pure hypercholesterolemia, unspecified: Secondary | ICD-10-CM | POA: Diagnosis not present

## 2024-03-17 DIAGNOSIS — M1990 Unspecified osteoarthritis, unspecified site: Secondary | ICD-10-CM | POA: Diagnosis not present

## 2024-03-17 DIAGNOSIS — I442 Atrioventricular block, complete: Secondary | ICD-10-CM | POA: Diagnosis not present

## 2024-03-17 DIAGNOSIS — H8110 Benign paroxysmal vertigo, unspecified ear: Secondary | ICD-10-CM | POA: Diagnosis not present

## 2024-03-17 DIAGNOSIS — E10621 Type 1 diabetes mellitus with foot ulcer: Secondary | ICD-10-CM | POA: Diagnosis not present

## 2024-03-17 DIAGNOSIS — I272 Pulmonary hypertension, unspecified: Secondary | ICD-10-CM | POA: Diagnosis not present

## 2024-03-17 DIAGNOSIS — G8929 Other chronic pain: Secondary | ICD-10-CM | POA: Diagnosis not present

## 2024-03-21 ENCOUNTER — Encounter: Payer: Self-pay | Admitting: Licensed Clinical Social Worker

## 2024-03-21 DIAGNOSIS — R350 Frequency of micturition: Secondary | ICD-10-CM | POA: Diagnosis not present

## 2024-03-21 DIAGNOSIS — R309 Painful micturition, unspecified: Secondary | ICD-10-CM | POA: Diagnosis not present

## 2024-03-24 DIAGNOSIS — E1069 Type 1 diabetes mellitus with other specified complication: Secondary | ICD-10-CM | POA: Diagnosis not present

## 2024-03-24 DIAGNOSIS — E039 Hypothyroidism, unspecified: Secondary | ICD-10-CM | POA: Diagnosis not present

## 2024-03-24 DIAGNOSIS — E1365 Other specified diabetes mellitus with hyperglycemia: Secondary | ICD-10-CM | POA: Diagnosis not present

## 2024-03-24 DIAGNOSIS — E1142 Type 2 diabetes mellitus with diabetic polyneuropathy: Secondary | ICD-10-CM | POA: Diagnosis not present

## 2024-03-25 ENCOUNTER — Ambulatory Visit: Payer: Self-pay | Admitting: Licensed Clinical Social Worker

## 2024-03-25 DIAGNOSIS — E119 Type 2 diabetes mellitus without complications: Secondary | ICD-10-CM | POA: Diagnosis not present

## 2024-03-25 DIAGNOSIS — I69354 Hemiplegia and hemiparesis following cerebral infarction affecting left non-dominant side: Secondary | ICD-10-CM | POA: Diagnosis not present

## 2024-03-25 NOTE — Patient Outreach (Signed)
 Care Coordination   Follow Up Visit Note   03/25/2024 Name: [name] MRN: [id number] DOB: January 31, 1951  [name] is a 73 y.o. year old female who sees [name] Coe, MD for primary care. I spoke with  [name] by phone today.  What matters to the patients health and wellness today?  Increasing access to private care options.    Goals Addressed             This Visit's Progress    Care Coordination       Care Coordination Interventions: Assessed Social Determinants of Health Reviewed all upcoming appointments in Epic system Motivational Interviewing employed Active listening / Reflection utilized  Emotional Support Provided Problem Solving /Task Center strategies reviewed Assessed needs and reviewed facility placement process; as well as the different levels of care Reviewed facility placement process :Pt is living with sister currently until placement can occur. Niece plans on reaching out to facilities for possible admission  Provided a list of facilities based on level of care needs Discussed payment options for placement :pt plans on private pay for ALF placement. Pt no longer wants to be admitted to an ALF due to financial concerns/having to private pay Pt is exploring private pay home aid options with her niece.          SDOH assessments and interventions completed:  Yes     Care Coordination Interventions:  Yes, provided   Interventions Today    Flowsheet Row Most Recent Value  Chronic Disease   Chronic disease during today's visit Other  [SDOH]  General Interventions   General Interventions Discussed/Reviewed Level of Care, Community Resources, General Interventions Discussed  [Pt reported she is currently considering having her neice purchase her home so that she can have funds to pay for private care longterm. We also discussed Medicaid eligibility requirements. Pt confirmed she had a list of private pay care  organizations.]  Level of Care Assisted Living  [Pt reports she has decided to no longer pursue placement in an ALF as she does not want to privately pay. She will keep this options in mind for furture considerations.]        Follow up plan: Follow up call scheduled for 04/25/2024    Encounter Outcome:  Patient Visit Completed   Kenton Kingfisher, LCSW [place]/[facility], [facility] Licensed Clinical Social Worker Care Coordinator [phone]

## 2024-03-26 DIAGNOSIS — M545 Low back pain, unspecified: Secondary | ICD-10-CM | POA: Diagnosis not present

## 2024-03-26 DIAGNOSIS — I442 Atrioventricular block, complete: Secondary | ICD-10-CM | POA: Diagnosis not present

## 2024-03-26 DIAGNOSIS — D649 Anemia, unspecified: Secondary | ICD-10-CM | POA: Diagnosis not present

## 2024-03-26 DIAGNOSIS — K5900 Constipation, unspecified: Secondary | ICD-10-CM | POA: Diagnosis not present

## 2024-03-26 DIAGNOSIS — E10621 Type 1 diabetes mellitus with foot ulcer: Secondary | ICD-10-CM | POA: Diagnosis not present

## 2024-03-26 DIAGNOSIS — I272 Pulmonary hypertension, unspecified: Secondary | ICD-10-CM | POA: Diagnosis not present

## 2024-03-26 DIAGNOSIS — J9621 Acute and chronic respiratory failure with hypoxia: Secondary | ICD-10-CM | POA: Diagnosis not present

## 2024-03-26 DIAGNOSIS — I5032 Chronic diastolic (congestive) heart failure: Secondary | ICD-10-CM | POA: Diagnosis not present

## 2024-03-26 DIAGNOSIS — E890 Postprocedural hypothyroidism: Secondary | ICD-10-CM | POA: Diagnosis not present

## 2024-03-26 DIAGNOSIS — I110 Hypertensive heart disease with heart failure: Secondary | ICD-10-CM | POA: Diagnosis not present

## 2024-03-26 DIAGNOSIS — K219 Gastro-esophageal reflux disease without esophagitis: Secondary | ICD-10-CM | POA: Diagnosis not present

## 2024-03-26 DIAGNOSIS — M1990 Unspecified osteoarthritis, unspecified site: Secondary | ICD-10-CM | POA: Diagnosis not present

## 2024-03-26 DIAGNOSIS — I480 Paroxysmal atrial fibrillation: Secondary | ICD-10-CM | POA: Diagnosis not present

## 2024-03-26 DIAGNOSIS — L89622 Pressure ulcer of left heel, stage 2: Secondary | ICD-10-CM | POA: Diagnosis not present

## 2024-03-26 DIAGNOSIS — H8110 Benign paroxysmal vertigo, unspecified ear: Secondary | ICD-10-CM | POA: Diagnosis not present

## 2024-03-26 DIAGNOSIS — K649 Unspecified hemorrhoids: Secondary | ICD-10-CM | POA: Diagnosis not present

## 2024-03-26 DIAGNOSIS — G8929 Other chronic pain: Secondary | ICD-10-CM | POA: Diagnosis not present

## 2024-03-26 DIAGNOSIS — G3184 Mild cognitive impairment, so stated: Secondary | ICD-10-CM | POA: Diagnosis not present

## 2024-03-26 DIAGNOSIS — I69354 Hemiplegia and hemiparesis following cerebral infarction affecting left non-dominant side: Secondary | ICD-10-CM | POA: Diagnosis not present

## 2024-03-26 DIAGNOSIS — E7800 Pure hypercholesterolemia, unspecified: Secondary | ICD-10-CM | POA: Diagnosis not present

## 2024-03-29 DIAGNOSIS — I272 Pulmonary hypertension, unspecified: Secondary | ICD-10-CM | POA: Diagnosis not present

## 2024-03-29 DIAGNOSIS — I442 Atrioventricular block, complete: Secondary | ICD-10-CM | POA: Diagnosis not present

## 2024-03-29 DIAGNOSIS — K219 Gastro-esophageal reflux disease without esophagitis: Secondary | ICD-10-CM | POA: Diagnosis not present

## 2024-03-29 DIAGNOSIS — M1990 Unspecified osteoarthritis, unspecified site: Secondary | ICD-10-CM | POA: Diagnosis not present

## 2024-03-29 DIAGNOSIS — I69354 Hemiplegia and hemiparesis following cerebral infarction affecting left non-dominant side: Secondary | ICD-10-CM | POA: Diagnosis not present

## 2024-03-29 DIAGNOSIS — K5900 Constipation, unspecified: Secondary | ICD-10-CM | POA: Diagnosis not present

## 2024-03-29 DIAGNOSIS — I110 Hypertensive heart disease with heart failure: Secondary | ICD-10-CM | POA: Diagnosis not present

## 2024-03-29 DIAGNOSIS — I480 Paroxysmal atrial fibrillation: Secondary | ICD-10-CM | POA: Diagnosis not present

## 2024-03-29 DIAGNOSIS — E7800 Pure hypercholesterolemia, unspecified: Secondary | ICD-10-CM | POA: Diagnosis not present

## 2024-03-29 DIAGNOSIS — L89622 Pressure ulcer of left heel, stage 2: Secondary | ICD-10-CM | POA: Diagnosis not present

## 2024-03-29 DIAGNOSIS — I5032 Chronic diastolic (congestive) heart failure: Secondary | ICD-10-CM | POA: Diagnosis not present

## 2024-04-03 ENCOUNTER — Ambulatory Visit (INDEPENDENT_AMBULATORY_CARE_PROVIDER_SITE_OTHER): Payer: Medicare PPO

## 2024-04-03 DIAGNOSIS — I442 Atrioventricular block, complete: Secondary | ICD-10-CM | POA: Diagnosis not present

## 2024-04-03 LAB — CUP PACEART REMOTE DEVICE CHECK
Battery Remaining Longevity: 95 mo
Battery Voltage: 3 V
Brady Statistic AP VP Percent: 46.71 %
Brady Statistic AP VS Percent: 1.39 %
Brady Statistic AS VP Percent: 48.63 %
Brady Statistic AS VS Percent: 3.25 %
Brady Statistic RA Percent Paced: 40 %
Brady Statistic RV Percent Paced: 91.07 %
Date Time Interrogation Session: 20250410004225
Implantable Lead Connection Status: 753985
Implantable Lead Connection Status: 753985
Implantable Lead Implant Date: 20210702
Implantable Lead Implant Date: 20210702
Implantable Lead Location: 753859
Implantable Lead Location: 753860
Implantable Lead Model: 5076
Implantable Lead Model: 5076
Implantable Pulse Generator Implant Date: 20210702
Lead Channel Impedance Value: 285 Ohm
Lead Channel Impedance Value: 342 Ohm
Lead Channel Impedance Value: 361 Ohm
Lead Channel Impedance Value: 418 Ohm
Lead Channel Pacing Threshold Amplitude: 0.625 V
Lead Channel Pacing Threshold Amplitude: 0.625 V
Lead Channel Pacing Threshold Pulse Width: 0.4 ms
Lead Channel Pacing Threshold Pulse Width: 0.4 ms
Lead Channel Sensing Intrinsic Amplitude: 4.5 mV
Lead Channel Sensing Intrinsic Amplitude: 4.5 mV
Lead Channel Sensing Intrinsic Amplitude: 7 mV
Lead Channel Sensing Intrinsic Amplitude: 7 mV
Lead Channel Setting Pacing Amplitude: 1.5 V
Lead Channel Setting Pacing Amplitude: 2.5 V
Lead Channel Setting Pacing Pulse Width: 0.4 ms
Lead Channel Setting Sensing Sensitivity: 1.2 mV
Zone Setting Status: 755011
Zone Setting Status: 755011

## 2024-04-04 ENCOUNTER — Other Ambulatory Visit (HOSPITAL_COMMUNITY): Payer: Self-pay

## 2024-04-09 DIAGNOSIS — L8990 Pressure ulcer of unspecified site, unspecified stage: Secondary | ICD-10-CM | POA: Diagnosis not present

## 2024-04-10 DIAGNOSIS — L89622 Pressure ulcer of left heel, stage 2: Secondary | ICD-10-CM | POA: Diagnosis not present

## 2024-04-10 DIAGNOSIS — M545 Low back pain, unspecified: Secondary | ICD-10-CM | POA: Diagnosis not present

## 2024-04-10 DIAGNOSIS — K649 Unspecified hemorrhoids: Secondary | ICD-10-CM | POA: Diagnosis not present

## 2024-04-10 DIAGNOSIS — I272 Pulmonary hypertension, unspecified: Secondary | ICD-10-CM | POA: Diagnosis not present

## 2024-04-10 DIAGNOSIS — I69354 Hemiplegia and hemiparesis following cerebral infarction affecting left non-dominant side: Secondary | ICD-10-CM | POA: Diagnosis not present

## 2024-04-10 DIAGNOSIS — I110 Hypertensive heart disease with heart failure: Secondary | ICD-10-CM | POA: Diagnosis not present

## 2024-04-10 DIAGNOSIS — G3184 Mild cognitive impairment, so stated: Secondary | ICD-10-CM | POA: Diagnosis not present

## 2024-04-10 DIAGNOSIS — I480 Paroxysmal atrial fibrillation: Secondary | ICD-10-CM | POA: Diagnosis not present

## 2024-04-10 DIAGNOSIS — E890 Postprocedural hypothyroidism: Secondary | ICD-10-CM | POA: Diagnosis not present

## 2024-04-10 DIAGNOSIS — H8110 Benign paroxysmal vertigo, unspecified ear: Secondary | ICD-10-CM | POA: Diagnosis not present

## 2024-04-10 DIAGNOSIS — E10621 Type 1 diabetes mellitus with foot ulcer: Secondary | ICD-10-CM | POA: Diagnosis not present

## 2024-04-10 DIAGNOSIS — K219 Gastro-esophageal reflux disease without esophagitis: Secondary | ICD-10-CM | POA: Diagnosis not present

## 2024-04-10 DIAGNOSIS — I442 Atrioventricular block, complete: Secondary | ICD-10-CM | POA: Diagnosis not present

## 2024-04-10 DIAGNOSIS — I5032 Chronic diastolic (congestive) heart failure: Secondary | ICD-10-CM | POA: Diagnosis not present

## 2024-04-10 DIAGNOSIS — K5900 Constipation, unspecified: Secondary | ICD-10-CM | POA: Diagnosis not present

## 2024-04-10 DIAGNOSIS — E7800 Pure hypercholesterolemia, unspecified: Secondary | ICD-10-CM | POA: Diagnosis not present

## 2024-04-10 DIAGNOSIS — M1990 Unspecified osteoarthritis, unspecified site: Secondary | ICD-10-CM | POA: Diagnosis not present

## 2024-04-10 DIAGNOSIS — D649 Anemia, unspecified: Secondary | ICD-10-CM | POA: Diagnosis not present

## 2024-04-10 DIAGNOSIS — G8929 Other chronic pain: Secondary | ICD-10-CM | POA: Diagnosis not present

## 2024-04-10 DIAGNOSIS — J9621 Acute and chronic respiratory failure with hypoxia: Secondary | ICD-10-CM | POA: Diagnosis not present

## 2024-04-14 ENCOUNTER — Encounter: Payer: Self-pay | Admitting: Podiatry

## 2024-04-14 ENCOUNTER — Ambulatory Visit: Payer: Medicare Other | Admitting: Podiatry

## 2024-04-14 DIAGNOSIS — M79674 Pain in right toe(s): Secondary | ICD-10-CM

## 2024-04-14 DIAGNOSIS — M79675 Pain in left toe(s): Secondary | ICD-10-CM

## 2024-04-14 DIAGNOSIS — E1159 Type 2 diabetes mellitus with other circulatory complications: Secondary | ICD-10-CM | POA: Diagnosis not present

## 2024-04-14 DIAGNOSIS — D689 Coagulation defect, unspecified: Secondary | ICD-10-CM | POA: Diagnosis not present

## 2024-04-14 DIAGNOSIS — B351 Tinea unguium: Secondary | ICD-10-CM

## 2024-04-14 NOTE — Progress Notes (Signed)
This patient returns to my office for at risk foot care.  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  This patient is unable to cut nails herself  since the patient cannot reach her  nails.These nails are painful walking and wearing shoes.  Patient presents in a wheelchair  with her granddaughter..  This patient presents for at risk foot care today.  General Appearance  Alert, conversant and in no acute stress.  Vascular  Dorsalis pedis and posterior tibial  pulses are absent due to swelling. bilaterally.  Capillary return is within normal limits  bilaterally. Temperature is within normal limits  bilaterally.  Neurologic  Senn-Weinstein monofilament wire diminished  bilaterally. Muscle power within normal limits bilaterally.  Nails Thick disfigured discolored nails with subungual debris  from hallux to fifth toes bilaterally. No evidence of bacterial infection or drainage bilaterally.  Orthopedic  No limitations of motion  feet .  No crepitus or effusions noted.  No bony pathology or digital deformities noted.  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  Hallux  malleus  B/L  Skin  normotropic skin with no porokeratosis noted bilaterally.  Skin lesion on dorsum of hallux malleus  B/L.    Onychomycosis  Pain in right toes  Pain in left toes  Consent was obtained for treatment procedures.   Mechanical debridement of nails 1-5  bilaterally performed with a nail nipper.  Filed with dremel without incident.    Return office visit    3 months       Told patient to return for periodic foot care and evaluation due to potential at risk complications.   Helane Gunther DPM

## 2024-04-18 DIAGNOSIS — K5900 Constipation, unspecified: Secondary | ICD-10-CM | POA: Diagnosis not present

## 2024-04-18 DIAGNOSIS — G3184 Mild cognitive impairment, so stated: Secondary | ICD-10-CM | POA: Diagnosis not present

## 2024-04-18 DIAGNOSIS — M545 Low back pain, unspecified: Secondary | ICD-10-CM | POA: Diagnosis not present

## 2024-04-18 DIAGNOSIS — E10621 Type 1 diabetes mellitus with foot ulcer: Secondary | ICD-10-CM | POA: Diagnosis not present

## 2024-04-18 DIAGNOSIS — I442 Atrioventricular block, complete: Secondary | ICD-10-CM | POA: Diagnosis not present

## 2024-04-18 DIAGNOSIS — I480 Paroxysmal atrial fibrillation: Secondary | ICD-10-CM | POA: Diagnosis not present

## 2024-04-18 DIAGNOSIS — D649 Anemia, unspecified: Secondary | ICD-10-CM | POA: Diagnosis not present

## 2024-04-18 DIAGNOSIS — I272 Pulmonary hypertension, unspecified: Secondary | ICD-10-CM | POA: Diagnosis not present

## 2024-04-18 DIAGNOSIS — M1990 Unspecified osteoarthritis, unspecified site: Secondary | ICD-10-CM | POA: Diagnosis not present

## 2024-04-18 DIAGNOSIS — I69354 Hemiplegia and hemiparesis following cerebral infarction affecting left non-dominant side: Secondary | ICD-10-CM | POA: Diagnosis not present

## 2024-04-18 DIAGNOSIS — I110 Hypertensive heart disease with heart failure: Secondary | ICD-10-CM | POA: Diagnosis not present

## 2024-04-18 DIAGNOSIS — L89622 Pressure ulcer of left heel, stage 2: Secondary | ICD-10-CM | POA: Diagnosis not present

## 2024-04-18 DIAGNOSIS — J9621 Acute and chronic respiratory failure with hypoxia: Secondary | ICD-10-CM | POA: Diagnosis not present

## 2024-04-18 DIAGNOSIS — G8929 Other chronic pain: Secondary | ICD-10-CM | POA: Diagnosis not present

## 2024-04-18 DIAGNOSIS — K219 Gastro-esophageal reflux disease without esophagitis: Secondary | ICD-10-CM | POA: Diagnosis not present

## 2024-04-18 DIAGNOSIS — E890 Postprocedural hypothyroidism: Secondary | ICD-10-CM | POA: Diagnosis not present

## 2024-04-18 DIAGNOSIS — I5032 Chronic diastolic (congestive) heart failure: Secondary | ICD-10-CM | POA: Diagnosis not present

## 2024-04-18 DIAGNOSIS — H8110 Benign paroxysmal vertigo, unspecified ear: Secondary | ICD-10-CM | POA: Diagnosis not present

## 2024-04-18 DIAGNOSIS — E7800 Pure hypercholesterolemia, unspecified: Secondary | ICD-10-CM | POA: Diagnosis not present

## 2024-04-18 DIAGNOSIS — K649 Unspecified hemorrhoids: Secondary | ICD-10-CM | POA: Diagnosis not present

## 2024-04-22 DIAGNOSIS — G3184 Mild cognitive impairment, so stated: Secondary | ICD-10-CM | POA: Diagnosis not present

## 2024-04-22 DIAGNOSIS — I442 Atrioventricular block, complete: Secondary | ICD-10-CM | POA: Diagnosis not present

## 2024-04-22 DIAGNOSIS — D649 Anemia, unspecified: Secondary | ICD-10-CM | POA: Diagnosis not present

## 2024-04-22 DIAGNOSIS — I69354 Hemiplegia and hemiparesis following cerebral infarction affecting left non-dominant side: Secondary | ICD-10-CM | POA: Diagnosis not present

## 2024-04-22 DIAGNOSIS — J9621 Acute and chronic respiratory failure with hypoxia: Secondary | ICD-10-CM | POA: Diagnosis not present

## 2024-04-22 DIAGNOSIS — E890 Postprocedural hypothyroidism: Secondary | ICD-10-CM | POA: Diagnosis not present

## 2024-04-22 DIAGNOSIS — E7800 Pure hypercholesterolemia, unspecified: Secondary | ICD-10-CM | POA: Diagnosis not present

## 2024-04-22 DIAGNOSIS — I5032 Chronic diastolic (congestive) heart failure: Secondary | ICD-10-CM | POA: Diagnosis not present

## 2024-04-22 DIAGNOSIS — K649 Unspecified hemorrhoids: Secondary | ICD-10-CM | POA: Diagnosis not present

## 2024-04-22 DIAGNOSIS — M1990 Unspecified osteoarthritis, unspecified site: Secondary | ICD-10-CM | POA: Diagnosis not present

## 2024-04-22 DIAGNOSIS — G8929 Other chronic pain: Secondary | ICD-10-CM | POA: Diagnosis not present

## 2024-04-22 DIAGNOSIS — K5900 Constipation, unspecified: Secondary | ICD-10-CM | POA: Diagnosis not present

## 2024-04-22 DIAGNOSIS — I272 Pulmonary hypertension, unspecified: Secondary | ICD-10-CM | POA: Diagnosis not present

## 2024-04-22 DIAGNOSIS — I110 Hypertensive heart disease with heart failure: Secondary | ICD-10-CM | POA: Diagnosis not present

## 2024-04-22 DIAGNOSIS — I480 Paroxysmal atrial fibrillation: Secondary | ICD-10-CM | POA: Diagnosis not present

## 2024-04-22 DIAGNOSIS — K219 Gastro-esophageal reflux disease without esophagitis: Secondary | ICD-10-CM | POA: Diagnosis not present

## 2024-04-22 DIAGNOSIS — E10621 Type 1 diabetes mellitus with foot ulcer: Secondary | ICD-10-CM | POA: Diagnosis not present

## 2024-04-22 DIAGNOSIS — L89622 Pressure ulcer of left heel, stage 2: Secondary | ICD-10-CM | POA: Diagnosis not present

## 2024-04-22 DIAGNOSIS — M545 Low back pain, unspecified: Secondary | ICD-10-CM | POA: Diagnosis not present

## 2024-04-22 DIAGNOSIS — H8110 Benign paroxysmal vertigo, unspecified ear: Secondary | ICD-10-CM | POA: Diagnosis not present

## 2024-04-23 DIAGNOSIS — E1069 Type 1 diabetes mellitus with other specified complication: Secondary | ICD-10-CM | POA: Diagnosis not present

## 2024-04-23 DIAGNOSIS — E1142 Type 2 diabetes mellitus with diabetic polyneuropathy: Secondary | ICD-10-CM | POA: Diagnosis not present

## 2024-04-23 DIAGNOSIS — E1365 Other specified diabetes mellitus with hyperglycemia: Secondary | ICD-10-CM | POA: Diagnosis not present

## 2024-04-23 DIAGNOSIS — E039 Hypothyroidism, unspecified: Secondary | ICD-10-CM | POA: Diagnosis not present

## 2024-04-29 DIAGNOSIS — I69354 Hemiplegia and hemiparesis following cerebral infarction affecting left non-dominant side: Secondary | ICD-10-CM | POA: Diagnosis not present

## 2024-04-29 DIAGNOSIS — G3184 Mild cognitive impairment, so stated: Secondary | ICD-10-CM | POA: Diagnosis not present

## 2024-04-29 DIAGNOSIS — M1990 Unspecified osteoarthritis, unspecified site: Secondary | ICD-10-CM | POA: Diagnosis not present

## 2024-04-29 DIAGNOSIS — I5032 Chronic diastolic (congestive) heart failure: Secondary | ICD-10-CM | POA: Diagnosis not present

## 2024-04-29 DIAGNOSIS — D649 Anemia, unspecified: Secondary | ICD-10-CM | POA: Diagnosis not present

## 2024-04-29 DIAGNOSIS — I480 Paroxysmal atrial fibrillation: Secondary | ICD-10-CM | POA: Diagnosis not present

## 2024-04-29 DIAGNOSIS — I110 Hypertensive heart disease with heart failure: Secondary | ICD-10-CM | POA: Diagnosis not present

## 2024-04-29 DIAGNOSIS — K5900 Constipation, unspecified: Secondary | ICD-10-CM | POA: Diagnosis not present

## 2024-04-29 DIAGNOSIS — I272 Pulmonary hypertension, unspecified: Secondary | ICD-10-CM | POA: Diagnosis not present

## 2024-04-29 DIAGNOSIS — E7800 Pure hypercholesterolemia, unspecified: Secondary | ICD-10-CM | POA: Diagnosis not present

## 2024-04-29 DIAGNOSIS — H8110 Benign paroxysmal vertigo, unspecified ear: Secondary | ICD-10-CM | POA: Diagnosis not present

## 2024-04-29 DIAGNOSIS — E890 Postprocedural hypothyroidism: Secondary | ICD-10-CM | POA: Diagnosis not present

## 2024-04-29 DIAGNOSIS — L89622 Pressure ulcer of left heel, stage 2: Secondary | ICD-10-CM | POA: Diagnosis not present

## 2024-04-29 DIAGNOSIS — I442 Atrioventricular block, complete: Secondary | ICD-10-CM | POA: Diagnosis not present

## 2024-04-29 DIAGNOSIS — M545 Low back pain, unspecified: Secondary | ICD-10-CM | POA: Diagnosis not present

## 2024-04-29 DIAGNOSIS — K649 Unspecified hemorrhoids: Secondary | ICD-10-CM | POA: Diagnosis not present

## 2024-04-29 DIAGNOSIS — E10621 Type 1 diabetes mellitus with foot ulcer: Secondary | ICD-10-CM | POA: Diagnosis not present

## 2024-04-29 DIAGNOSIS — J9621 Acute and chronic respiratory failure with hypoxia: Secondary | ICD-10-CM | POA: Diagnosis not present

## 2024-04-29 DIAGNOSIS — K219 Gastro-esophageal reflux disease without esophagitis: Secondary | ICD-10-CM | POA: Diagnosis not present

## 2024-04-29 DIAGNOSIS — G8929 Other chronic pain: Secondary | ICD-10-CM | POA: Diagnosis not present

## 2024-05-06 DIAGNOSIS — E10621 Type 1 diabetes mellitus with foot ulcer: Secondary | ICD-10-CM | POA: Diagnosis not present

## 2024-05-06 DIAGNOSIS — M545 Low back pain, unspecified: Secondary | ICD-10-CM | POA: Diagnosis not present

## 2024-05-06 DIAGNOSIS — I272 Pulmonary hypertension, unspecified: Secondary | ICD-10-CM | POA: Diagnosis not present

## 2024-05-06 DIAGNOSIS — I480 Paroxysmal atrial fibrillation: Secondary | ICD-10-CM | POA: Diagnosis not present

## 2024-05-06 DIAGNOSIS — G8929 Other chronic pain: Secondary | ICD-10-CM | POA: Diagnosis not present

## 2024-05-06 DIAGNOSIS — E890 Postprocedural hypothyroidism: Secondary | ICD-10-CM | POA: Diagnosis not present

## 2024-05-06 DIAGNOSIS — L89622 Pressure ulcer of left heel, stage 2: Secondary | ICD-10-CM | POA: Diagnosis not present

## 2024-05-06 DIAGNOSIS — G3184 Mild cognitive impairment, so stated: Secondary | ICD-10-CM | POA: Diagnosis not present

## 2024-05-06 DIAGNOSIS — H8110 Benign paroxysmal vertigo, unspecified ear: Secondary | ICD-10-CM | POA: Diagnosis not present

## 2024-05-06 DIAGNOSIS — J9621 Acute and chronic respiratory failure with hypoxia: Secondary | ICD-10-CM | POA: Diagnosis not present

## 2024-05-06 DIAGNOSIS — E7800 Pure hypercholesterolemia, unspecified: Secondary | ICD-10-CM | POA: Diagnosis not present

## 2024-05-06 DIAGNOSIS — I110 Hypertensive heart disease with heart failure: Secondary | ICD-10-CM | POA: Diagnosis not present

## 2024-05-06 DIAGNOSIS — I442 Atrioventricular block, complete: Secondary | ICD-10-CM | POA: Diagnosis not present

## 2024-05-06 DIAGNOSIS — D649 Anemia, unspecified: Secondary | ICD-10-CM | POA: Diagnosis not present

## 2024-05-06 DIAGNOSIS — K219 Gastro-esophageal reflux disease without esophagitis: Secondary | ICD-10-CM | POA: Diagnosis not present

## 2024-05-06 DIAGNOSIS — I69354 Hemiplegia and hemiparesis following cerebral infarction affecting left non-dominant side: Secondary | ICD-10-CM | POA: Diagnosis not present

## 2024-05-06 DIAGNOSIS — I5032 Chronic diastolic (congestive) heart failure: Secondary | ICD-10-CM | POA: Diagnosis not present

## 2024-05-06 DIAGNOSIS — K649 Unspecified hemorrhoids: Secondary | ICD-10-CM | POA: Diagnosis not present

## 2024-05-06 DIAGNOSIS — M1990 Unspecified osteoarthritis, unspecified site: Secondary | ICD-10-CM | POA: Diagnosis not present

## 2024-05-06 DIAGNOSIS — K5900 Constipation, unspecified: Secondary | ICD-10-CM | POA: Diagnosis not present

## 2024-05-08 ENCOUNTER — Other Ambulatory Visit: Payer: Self-pay | Admitting: Cardiology

## 2024-05-08 DIAGNOSIS — I10 Essential (primary) hypertension: Secondary | ICD-10-CM

## 2024-05-14 DIAGNOSIS — I272 Pulmonary hypertension, unspecified: Secondary | ICD-10-CM | POA: Diagnosis not present

## 2024-05-14 DIAGNOSIS — M1990 Unspecified osteoarthritis, unspecified site: Secondary | ICD-10-CM | POA: Diagnosis not present

## 2024-05-14 DIAGNOSIS — E7800 Pure hypercholesterolemia, unspecified: Secondary | ICD-10-CM | POA: Diagnosis not present

## 2024-05-14 DIAGNOSIS — I69354 Hemiplegia and hemiparesis following cerebral infarction affecting left non-dominant side: Secondary | ICD-10-CM | POA: Diagnosis not present

## 2024-05-14 DIAGNOSIS — I5032 Chronic diastolic (congestive) heart failure: Secondary | ICD-10-CM | POA: Diagnosis not present

## 2024-05-14 DIAGNOSIS — E10621 Type 1 diabetes mellitus with foot ulcer: Secondary | ICD-10-CM | POA: Diagnosis not present

## 2024-05-14 DIAGNOSIS — D649 Anemia, unspecified: Secondary | ICD-10-CM | POA: Diagnosis not present

## 2024-05-14 DIAGNOSIS — J9621 Acute and chronic respiratory failure with hypoxia: Secondary | ICD-10-CM | POA: Diagnosis not present

## 2024-05-14 DIAGNOSIS — K219 Gastro-esophageal reflux disease without esophagitis: Secondary | ICD-10-CM | POA: Diagnosis not present

## 2024-05-14 DIAGNOSIS — I442 Atrioventricular block, complete: Secondary | ICD-10-CM | POA: Diagnosis not present

## 2024-05-14 DIAGNOSIS — L89622 Pressure ulcer of left heel, stage 2: Secondary | ICD-10-CM | POA: Diagnosis not present

## 2024-05-14 DIAGNOSIS — G8929 Other chronic pain: Secondary | ICD-10-CM | POA: Diagnosis not present

## 2024-05-14 DIAGNOSIS — K649 Unspecified hemorrhoids: Secondary | ICD-10-CM | POA: Diagnosis not present

## 2024-05-14 DIAGNOSIS — G3184 Mild cognitive impairment, so stated: Secondary | ICD-10-CM | POA: Diagnosis not present

## 2024-05-14 DIAGNOSIS — H8110 Benign paroxysmal vertigo, unspecified ear: Secondary | ICD-10-CM | POA: Diagnosis not present

## 2024-05-14 DIAGNOSIS — I480 Paroxysmal atrial fibrillation: Secondary | ICD-10-CM | POA: Diagnosis not present

## 2024-05-14 DIAGNOSIS — K5900 Constipation, unspecified: Secondary | ICD-10-CM | POA: Diagnosis not present

## 2024-05-14 DIAGNOSIS — I110 Hypertensive heart disease with heart failure: Secondary | ICD-10-CM | POA: Diagnosis not present

## 2024-05-14 DIAGNOSIS — M545 Low back pain, unspecified: Secondary | ICD-10-CM | POA: Diagnosis not present

## 2024-05-14 DIAGNOSIS — E890 Postprocedural hypothyroidism: Secondary | ICD-10-CM | POA: Diagnosis not present

## 2024-05-15 ENCOUNTER — Other Ambulatory Visit: Payer: Self-pay

## 2024-05-16 NOTE — Progress Notes (Signed)
 Remote pacemaker transmission.

## 2024-05-21 DIAGNOSIS — E103593 Type 1 diabetes mellitus with proliferative diabetic retinopathy without macular edema, bilateral: Secondary | ICD-10-CM | POA: Diagnosis not present

## 2024-05-21 DIAGNOSIS — H43813 Vitreous degeneration, bilateral: Secondary | ICD-10-CM | POA: Diagnosis not present

## 2024-05-21 DIAGNOSIS — H3582 Retinal ischemia: Secondary | ICD-10-CM | POA: Diagnosis not present

## 2024-05-22 DIAGNOSIS — R300 Dysuria: Secondary | ICD-10-CM | POA: Diagnosis not present

## 2024-05-23 DIAGNOSIS — J9621 Acute and chronic respiratory failure with hypoxia: Secondary | ICD-10-CM | POA: Diagnosis not present

## 2024-05-23 DIAGNOSIS — I5032 Chronic diastolic (congestive) heart failure: Secondary | ICD-10-CM | POA: Diagnosis not present

## 2024-05-23 DIAGNOSIS — G3184 Mild cognitive impairment, so stated: Secondary | ICD-10-CM | POA: Diagnosis not present

## 2024-05-23 DIAGNOSIS — G8929 Other chronic pain: Secondary | ICD-10-CM | POA: Diagnosis not present

## 2024-05-23 DIAGNOSIS — K649 Unspecified hemorrhoids: Secondary | ICD-10-CM | POA: Diagnosis not present

## 2024-05-23 DIAGNOSIS — I480 Paroxysmal atrial fibrillation: Secondary | ICD-10-CM | POA: Diagnosis not present

## 2024-05-23 DIAGNOSIS — M545 Low back pain, unspecified: Secondary | ICD-10-CM | POA: Diagnosis not present

## 2024-05-23 DIAGNOSIS — L89622 Pressure ulcer of left heel, stage 2: Secondary | ICD-10-CM | POA: Diagnosis not present

## 2024-05-23 DIAGNOSIS — M1990 Unspecified osteoarthritis, unspecified site: Secondary | ICD-10-CM | POA: Diagnosis not present

## 2024-05-23 DIAGNOSIS — I69354 Hemiplegia and hemiparesis following cerebral infarction affecting left non-dominant side: Secondary | ICD-10-CM | POA: Diagnosis not present

## 2024-05-23 DIAGNOSIS — D649 Anemia, unspecified: Secondary | ICD-10-CM | POA: Diagnosis not present

## 2024-05-23 DIAGNOSIS — I442 Atrioventricular block, complete: Secondary | ICD-10-CM | POA: Diagnosis not present

## 2024-05-23 DIAGNOSIS — E7800 Pure hypercholesterolemia, unspecified: Secondary | ICD-10-CM | POA: Diagnosis not present

## 2024-05-23 DIAGNOSIS — H8110 Benign paroxysmal vertigo, unspecified ear: Secondary | ICD-10-CM | POA: Diagnosis not present

## 2024-05-23 DIAGNOSIS — K219 Gastro-esophageal reflux disease without esophagitis: Secondary | ICD-10-CM | POA: Diagnosis not present

## 2024-05-23 DIAGNOSIS — E890 Postprocedural hypothyroidism: Secondary | ICD-10-CM | POA: Diagnosis not present

## 2024-05-23 DIAGNOSIS — I110 Hypertensive heart disease with heart failure: Secondary | ICD-10-CM | POA: Diagnosis not present

## 2024-05-23 DIAGNOSIS — K5900 Constipation, unspecified: Secondary | ICD-10-CM | POA: Diagnosis not present

## 2024-05-23 DIAGNOSIS — E10621 Type 1 diabetes mellitus with foot ulcer: Secondary | ICD-10-CM | POA: Diagnosis not present

## 2024-05-23 DIAGNOSIS — I272 Pulmonary hypertension, unspecified: Secondary | ICD-10-CM | POA: Diagnosis not present

## 2024-05-26 DIAGNOSIS — G3184 Mild cognitive impairment, so stated: Secondary | ICD-10-CM | POA: Diagnosis not present

## 2024-05-26 DIAGNOSIS — E890 Postprocedural hypothyroidism: Secondary | ICD-10-CM | POA: Diagnosis not present

## 2024-05-26 DIAGNOSIS — K219 Gastro-esophageal reflux disease without esophagitis: Secondary | ICD-10-CM | POA: Diagnosis not present

## 2024-05-26 DIAGNOSIS — M545 Low back pain, unspecified: Secondary | ICD-10-CM | POA: Diagnosis not present

## 2024-05-26 DIAGNOSIS — I272 Pulmonary hypertension, unspecified: Secondary | ICD-10-CM | POA: Diagnosis not present

## 2024-05-26 DIAGNOSIS — D649 Anemia, unspecified: Secondary | ICD-10-CM | POA: Diagnosis not present

## 2024-05-26 DIAGNOSIS — L89622 Pressure ulcer of left heel, stage 2: Secondary | ICD-10-CM | POA: Diagnosis not present

## 2024-05-26 DIAGNOSIS — I442 Atrioventricular block, complete: Secondary | ICD-10-CM | POA: Diagnosis not present

## 2024-05-26 DIAGNOSIS — M1990 Unspecified osteoarthritis, unspecified site: Secondary | ICD-10-CM | POA: Diagnosis not present

## 2024-05-26 DIAGNOSIS — I480 Paroxysmal atrial fibrillation: Secondary | ICD-10-CM | POA: Diagnosis not present

## 2024-05-26 DIAGNOSIS — E7800 Pure hypercholesterolemia, unspecified: Secondary | ICD-10-CM | POA: Diagnosis not present

## 2024-05-26 DIAGNOSIS — I110 Hypertensive heart disease with heart failure: Secondary | ICD-10-CM | POA: Diagnosis not present

## 2024-05-26 DIAGNOSIS — I69354 Hemiplegia and hemiparesis following cerebral infarction affecting left non-dominant side: Secondary | ICD-10-CM | POA: Diagnosis not present

## 2024-05-26 DIAGNOSIS — G8929 Other chronic pain: Secondary | ICD-10-CM | POA: Diagnosis not present

## 2024-05-26 DIAGNOSIS — K5900 Constipation, unspecified: Secondary | ICD-10-CM | POA: Diagnosis not present

## 2024-05-26 DIAGNOSIS — I5032 Chronic diastolic (congestive) heart failure: Secondary | ICD-10-CM | POA: Diagnosis not present

## 2024-05-26 DIAGNOSIS — K649 Unspecified hemorrhoids: Secondary | ICD-10-CM | POA: Diagnosis not present

## 2024-05-26 DIAGNOSIS — H8110 Benign paroxysmal vertigo, unspecified ear: Secondary | ICD-10-CM | POA: Diagnosis not present

## 2024-05-26 DIAGNOSIS — E10621 Type 1 diabetes mellitus with foot ulcer: Secondary | ICD-10-CM | POA: Diagnosis not present

## 2024-05-26 DIAGNOSIS — J9621 Acute and chronic respiratory failure with hypoxia: Secondary | ICD-10-CM | POA: Diagnosis not present

## 2024-05-28 DIAGNOSIS — I272 Pulmonary hypertension, unspecified: Secondary | ICD-10-CM | POA: Diagnosis not present

## 2024-05-28 DIAGNOSIS — I110 Hypertensive heart disease with heart failure: Secondary | ICD-10-CM | POA: Diagnosis not present

## 2024-05-28 DIAGNOSIS — I442 Atrioventricular block, complete: Secondary | ICD-10-CM | POA: Diagnosis not present

## 2024-05-28 DIAGNOSIS — I480 Paroxysmal atrial fibrillation: Secondary | ICD-10-CM | POA: Diagnosis not present

## 2024-05-28 DIAGNOSIS — M1990 Unspecified osteoarthritis, unspecified site: Secondary | ICD-10-CM | POA: Diagnosis not present

## 2024-05-28 DIAGNOSIS — L89622 Pressure ulcer of left heel, stage 2: Secondary | ICD-10-CM | POA: Diagnosis not present

## 2024-05-28 DIAGNOSIS — K219 Gastro-esophageal reflux disease without esophagitis: Secondary | ICD-10-CM | POA: Diagnosis not present

## 2024-05-28 DIAGNOSIS — I69354 Hemiplegia and hemiparesis following cerebral infarction affecting left non-dominant side: Secondary | ICD-10-CM | POA: Diagnosis not present

## 2024-05-28 DIAGNOSIS — E7800 Pure hypercholesterolemia, unspecified: Secondary | ICD-10-CM | POA: Diagnosis not present

## 2024-05-28 DIAGNOSIS — I5032 Chronic diastolic (congestive) heart failure: Secondary | ICD-10-CM | POA: Diagnosis not present

## 2024-05-28 DIAGNOSIS — K5900 Constipation, unspecified: Secondary | ICD-10-CM | POA: Diagnosis not present

## 2024-06-02 DIAGNOSIS — I110 Hypertensive heart disease with heart failure: Secondary | ICD-10-CM | POA: Diagnosis not present

## 2024-06-02 DIAGNOSIS — J9621 Acute and chronic respiratory failure with hypoxia: Secondary | ICD-10-CM | POA: Diagnosis not present

## 2024-06-02 DIAGNOSIS — G8929 Other chronic pain: Secondary | ICD-10-CM | POA: Diagnosis not present

## 2024-06-02 DIAGNOSIS — I69354 Hemiplegia and hemiparesis following cerebral infarction affecting left non-dominant side: Secondary | ICD-10-CM | POA: Diagnosis not present

## 2024-06-02 DIAGNOSIS — K5900 Constipation, unspecified: Secondary | ICD-10-CM | POA: Diagnosis not present

## 2024-06-02 DIAGNOSIS — E7800 Pure hypercholesterolemia, unspecified: Secondary | ICD-10-CM | POA: Diagnosis not present

## 2024-06-02 DIAGNOSIS — I442 Atrioventricular block, complete: Secondary | ICD-10-CM | POA: Diagnosis not present

## 2024-06-02 DIAGNOSIS — I5032 Chronic diastolic (congestive) heart failure: Secondary | ICD-10-CM | POA: Diagnosis not present

## 2024-06-02 DIAGNOSIS — K649 Unspecified hemorrhoids: Secondary | ICD-10-CM | POA: Diagnosis not present

## 2024-06-02 DIAGNOSIS — E890 Postprocedural hypothyroidism: Secondary | ICD-10-CM | POA: Diagnosis not present

## 2024-06-02 DIAGNOSIS — I272 Pulmonary hypertension, unspecified: Secondary | ICD-10-CM | POA: Diagnosis not present

## 2024-06-02 DIAGNOSIS — K219 Gastro-esophageal reflux disease without esophagitis: Secondary | ICD-10-CM | POA: Diagnosis not present

## 2024-06-02 DIAGNOSIS — M1990 Unspecified osteoarthritis, unspecified site: Secondary | ICD-10-CM | POA: Diagnosis not present

## 2024-06-02 DIAGNOSIS — I480 Paroxysmal atrial fibrillation: Secondary | ICD-10-CM | POA: Diagnosis not present

## 2024-06-02 DIAGNOSIS — D649 Anemia, unspecified: Secondary | ICD-10-CM | POA: Diagnosis not present

## 2024-06-02 DIAGNOSIS — E10621 Type 1 diabetes mellitus with foot ulcer: Secondary | ICD-10-CM | POA: Diagnosis not present

## 2024-06-02 DIAGNOSIS — M545 Low back pain, unspecified: Secondary | ICD-10-CM | POA: Diagnosis not present

## 2024-06-02 DIAGNOSIS — H8110 Benign paroxysmal vertigo, unspecified ear: Secondary | ICD-10-CM | POA: Diagnosis not present

## 2024-06-02 DIAGNOSIS — L89622 Pressure ulcer of left heel, stage 2: Secondary | ICD-10-CM | POA: Diagnosis not present

## 2024-06-02 DIAGNOSIS — G3184 Mild cognitive impairment, so stated: Secondary | ICD-10-CM | POA: Diagnosis not present

## 2024-06-03 ENCOUNTER — Ambulatory Visit (HOSPITAL_BASED_OUTPATIENT_CLINIC_OR_DEPARTMENT_OTHER): Admitting: Internal Medicine

## 2024-06-03 DIAGNOSIS — I10 Essential (primary) hypertension: Secondary | ICD-10-CM | POA: Diagnosis not present

## 2024-06-03 DIAGNOSIS — C73 Malignant neoplasm of thyroid gland: Secondary | ICD-10-CM | POA: Diagnosis not present

## 2024-06-03 DIAGNOSIS — I639 Cerebral infarction, unspecified: Secondary | ICD-10-CM | POA: Diagnosis not present

## 2024-06-03 DIAGNOSIS — Z794 Long term (current) use of insulin: Secondary | ICD-10-CM | POA: Diagnosis not present

## 2024-06-03 DIAGNOSIS — E7800 Pure hypercholesterolemia, unspecified: Secondary | ICD-10-CM | POA: Diagnosis not present

## 2024-06-03 DIAGNOSIS — E11319 Type 2 diabetes mellitus with unspecified diabetic retinopathy without macular edema: Secondary | ICD-10-CM | POA: Diagnosis not present

## 2024-06-03 DIAGNOSIS — E109 Type 1 diabetes mellitus without complications: Secondary | ICD-10-CM | POA: Diagnosis not present

## 2024-06-03 DIAGNOSIS — E890 Postprocedural hypothyroidism: Secondary | ICD-10-CM | POA: Diagnosis not present

## 2024-06-04 ENCOUNTER — Encounter (HOSPITAL_BASED_OUTPATIENT_CLINIC_OR_DEPARTMENT_OTHER): Attending: Internal Medicine | Admitting: Internal Medicine

## 2024-06-04 DIAGNOSIS — L97512 Non-pressure chronic ulcer of other part of right foot with fat layer exposed: Secondary | ICD-10-CM | POA: Diagnosis not present

## 2024-06-04 DIAGNOSIS — E1042 Type 1 diabetes mellitus with diabetic polyneuropathy: Secondary | ICD-10-CM | POA: Insufficient documentation

## 2024-06-04 DIAGNOSIS — I890 Lymphedema, not elsewhere classified: Secondary | ICD-10-CM | POA: Diagnosis not present

## 2024-06-04 DIAGNOSIS — N9089 Other specified noninflammatory disorders of vulva and perineum: Secondary | ICD-10-CM | POA: Insufficient documentation

## 2024-06-04 DIAGNOSIS — E10621 Type 1 diabetes mellitus with foot ulcer: Secondary | ICD-10-CM | POA: Diagnosis not present

## 2024-06-10 DIAGNOSIS — J9621 Acute and chronic respiratory failure with hypoxia: Secondary | ICD-10-CM | POA: Diagnosis not present

## 2024-06-10 DIAGNOSIS — K5900 Constipation, unspecified: Secondary | ICD-10-CM | POA: Diagnosis not present

## 2024-06-10 DIAGNOSIS — E109 Type 1 diabetes mellitus without complications: Secondary | ICD-10-CM | POA: Diagnosis not present

## 2024-06-10 DIAGNOSIS — I110 Hypertensive heart disease with heart failure: Secondary | ICD-10-CM | POA: Diagnosis not present

## 2024-06-10 DIAGNOSIS — G3184 Mild cognitive impairment, so stated: Secondary | ICD-10-CM | POA: Diagnosis not present

## 2024-06-10 DIAGNOSIS — K649 Unspecified hemorrhoids: Secondary | ICD-10-CM | POA: Diagnosis not present

## 2024-06-10 DIAGNOSIS — I272 Pulmonary hypertension, unspecified: Secondary | ICD-10-CM | POA: Diagnosis not present

## 2024-06-10 DIAGNOSIS — E890 Postprocedural hypothyroidism: Secondary | ICD-10-CM | POA: Diagnosis not present

## 2024-06-10 DIAGNOSIS — I480 Paroxysmal atrial fibrillation: Secondary | ICD-10-CM | POA: Diagnosis not present

## 2024-06-10 DIAGNOSIS — M545 Low back pain, unspecified: Secondary | ICD-10-CM | POA: Diagnosis not present

## 2024-06-10 DIAGNOSIS — E7800 Pure hypercholesterolemia, unspecified: Secondary | ICD-10-CM | POA: Diagnosis not present

## 2024-06-10 DIAGNOSIS — M1990 Unspecified osteoarthritis, unspecified site: Secondary | ICD-10-CM | POA: Diagnosis not present

## 2024-06-10 DIAGNOSIS — I69354 Hemiplegia and hemiparesis following cerebral infarction affecting left non-dominant side: Secondary | ICD-10-CM | POA: Diagnosis not present

## 2024-06-10 DIAGNOSIS — I442 Atrioventricular block, complete: Secondary | ICD-10-CM | POA: Diagnosis not present

## 2024-06-10 DIAGNOSIS — L89622 Pressure ulcer of left heel, stage 2: Secondary | ICD-10-CM | POA: Diagnosis not present

## 2024-06-10 DIAGNOSIS — E1042 Type 1 diabetes mellitus with diabetic polyneuropathy: Secondary | ICD-10-CM | POA: Diagnosis not present

## 2024-06-10 DIAGNOSIS — H8110 Benign paroxysmal vertigo, unspecified ear: Secondary | ICD-10-CM | POA: Diagnosis not present

## 2024-06-10 DIAGNOSIS — D649 Anemia, unspecified: Secondary | ICD-10-CM | POA: Diagnosis not present

## 2024-06-10 DIAGNOSIS — I5032 Chronic diastolic (congestive) heart failure: Secondary | ICD-10-CM | POA: Diagnosis not present

## 2024-06-10 DIAGNOSIS — K219 Gastro-esophageal reflux disease without esophagitis: Secondary | ICD-10-CM | POA: Diagnosis not present

## 2024-06-10 DIAGNOSIS — G8929 Other chronic pain: Secondary | ICD-10-CM | POA: Diagnosis not present

## 2024-06-10 DIAGNOSIS — E10621 Type 1 diabetes mellitus with foot ulcer: Secondary | ICD-10-CM | POA: Diagnosis not present

## 2024-06-16 ENCOUNTER — Encounter (HOSPITAL_BASED_OUTPATIENT_CLINIC_OR_DEPARTMENT_OTHER): Admitting: Internal Medicine

## 2024-06-16 DIAGNOSIS — L97512 Non-pressure chronic ulcer of other part of right foot with fat layer exposed: Secondary | ICD-10-CM | POA: Diagnosis not present

## 2024-06-16 DIAGNOSIS — E10621 Type 1 diabetes mellitus with foot ulcer: Secondary | ICD-10-CM

## 2024-06-16 DIAGNOSIS — E1042 Type 1 diabetes mellitus with diabetic polyneuropathy: Secondary | ICD-10-CM | POA: Diagnosis not present

## 2024-06-17 DIAGNOSIS — M545 Low back pain, unspecified: Secondary | ICD-10-CM | POA: Diagnosis not present

## 2024-06-17 DIAGNOSIS — K649 Unspecified hemorrhoids: Secondary | ICD-10-CM | POA: Diagnosis not present

## 2024-06-17 DIAGNOSIS — I5032 Chronic diastolic (congestive) heart failure: Secondary | ICD-10-CM | POA: Diagnosis not present

## 2024-06-17 DIAGNOSIS — I272 Pulmonary hypertension, unspecified: Secondary | ICD-10-CM | POA: Diagnosis not present

## 2024-06-17 DIAGNOSIS — D649 Anemia, unspecified: Secondary | ICD-10-CM | POA: Diagnosis not present

## 2024-06-17 DIAGNOSIS — I480 Paroxysmal atrial fibrillation: Secondary | ICD-10-CM | POA: Diagnosis not present

## 2024-06-17 DIAGNOSIS — M1990 Unspecified osteoarthritis, unspecified site: Secondary | ICD-10-CM | POA: Diagnosis not present

## 2024-06-17 DIAGNOSIS — I69354 Hemiplegia and hemiparesis following cerebral infarction affecting left non-dominant side: Secondary | ICD-10-CM | POA: Diagnosis not present

## 2024-06-17 DIAGNOSIS — I110 Hypertensive heart disease with heart failure: Secondary | ICD-10-CM | POA: Diagnosis not present

## 2024-06-17 DIAGNOSIS — E890 Postprocedural hypothyroidism: Secondary | ICD-10-CM | POA: Diagnosis not present

## 2024-06-17 DIAGNOSIS — L89622 Pressure ulcer of left heel, stage 2: Secondary | ICD-10-CM | POA: Diagnosis not present

## 2024-06-17 DIAGNOSIS — G8929 Other chronic pain: Secondary | ICD-10-CM | POA: Diagnosis not present

## 2024-06-17 DIAGNOSIS — J9621 Acute and chronic respiratory failure with hypoxia: Secondary | ICD-10-CM | POA: Diagnosis not present

## 2024-06-17 DIAGNOSIS — K219 Gastro-esophageal reflux disease without esophagitis: Secondary | ICD-10-CM | POA: Diagnosis not present

## 2024-06-17 DIAGNOSIS — I442 Atrioventricular block, complete: Secondary | ICD-10-CM | POA: Diagnosis not present

## 2024-06-17 DIAGNOSIS — G3184 Mild cognitive impairment, so stated: Secondary | ICD-10-CM | POA: Diagnosis not present

## 2024-06-17 DIAGNOSIS — K5900 Constipation, unspecified: Secondary | ICD-10-CM | POA: Diagnosis not present

## 2024-06-17 DIAGNOSIS — E7800 Pure hypercholesterolemia, unspecified: Secondary | ICD-10-CM | POA: Diagnosis not present

## 2024-06-17 DIAGNOSIS — H8110 Benign paroxysmal vertigo, unspecified ear: Secondary | ICD-10-CM | POA: Diagnosis not present

## 2024-06-17 DIAGNOSIS — E10621 Type 1 diabetes mellitus with foot ulcer: Secondary | ICD-10-CM | POA: Diagnosis not present

## 2024-06-24 DIAGNOSIS — L89622 Pressure ulcer of left heel, stage 2: Secondary | ICD-10-CM | POA: Diagnosis not present

## 2024-06-24 DIAGNOSIS — I110 Hypertensive heart disease with heart failure: Secondary | ICD-10-CM | POA: Diagnosis not present

## 2024-06-24 DIAGNOSIS — K5900 Constipation, unspecified: Secondary | ICD-10-CM | POA: Diagnosis not present

## 2024-06-24 DIAGNOSIS — I272 Pulmonary hypertension, unspecified: Secondary | ICD-10-CM | POA: Diagnosis not present

## 2024-06-24 DIAGNOSIS — E10621 Type 1 diabetes mellitus with foot ulcer: Secondary | ICD-10-CM | POA: Diagnosis not present

## 2024-06-24 DIAGNOSIS — I480 Paroxysmal atrial fibrillation: Secondary | ICD-10-CM | POA: Diagnosis not present

## 2024-06-24 DIAGNOSIS — G3184 Mild cognitive impairment, so stated: Secondary | ICD-10-CM | POA: Diagnosis not present

## 2024-06-24 DIAGNOSIS — E7800 Pure hypercholesterolemia, unspecified: Secondary | ICD-10-CM | POA: Diagnosis not present

## 2024-06-24 DIAGNOSIS — G8929 Other chronic pain: Secondary | ICD-10-CM | POA: Diagnosis not present

## 2024-06-24 DIAGNOSIS — D649 Anemia, unspecified: Secondary | ICD-10-CM | POA: Diagnosis not present

## 2024-06-24 DIAGNOSIS — M545 Low back pain, unspecified: Secondary | ICD-10-CM | POA: Diagnosis not present

## 2024-06-24 DIAGNOSIS — I5032 Chronic diastolic (congestive) heart failure: Secondary | ICD-10-CM | POA: Diagnosis not present

## 2024-06-24 DIAGNOSIS — I442 Atrioventricular block, complete: Secondary | ICD-10-CM | POA: Diagnosis not present

## 2024-06-24 DIAGNOSIS — K219 Gastro-esophageal reflux disease without esophagitis: Secondary | ICD-10-CM | POA: Diagnosis not present

## 2024-06-24 DIAGNOSIS — M1990 Unspecified osteoarthritis, unspecified site: Secondary | ICD-10-CM | POA: Diagnosis not present

## 2024-06-24 DIAGNOSIS — J9621 Acute and chronic respiratory failure with hypoxia: Secondary | ICD-10-CM | POA: Diagnosis not present

## 2024-06-24 DIAGNOSIS — K649 Unspecified hemorrhoids: Secondary | ICD-10-CM | POA: Diagnosis not present

## 2024-06-24 DIAGNOSIS — E890 Postprocedural hypothyroidism: Secondary | ICD-10-CM | POA: Diagnosis not present

## 2024-06-24 DIAGNOSIS — I69354 Hemiplegia and hemiparesis following cerebral infarction affecting left non-dominant side: Secondary | ICD-10-CM | POA: Diagnosis not present

## 2024-06-24 DIAGNOSIS — H8110 Benign paroxysmal vertigo, unspecified ear: Secondary | ICD-10-CM | POA: Diagnosis not present

## 2024-07-01 DIAGNOSIS — I272 Pulmonary hypertension, unspecified: Secondary | ICD-10-CM | POA: Diagnosis not present

## 2024-07-01 DIAGNOSIS — I110 Hypertensive heart disease with heart failure: Secondary | ICD-10-CM | POA: Diagnosis not present

## 2024-07-01 DIAGNOSIS — I442 Atrioventricular block, complete: Secondary | ICD-10-CM | POA: Diagnosis not present

## 2024-07-01 DIAGNOSIS — M545 Low back pain, unspecified: Secondary | ICD-10-CM | POA: Diagnosis not present

## 2024-07-01 DIAGNOSIS — L89622 Pressure ulcer of left heel, stage 2: Secondary | ICD-10-CM | POA: Diagnosis not present

## 2024-07-01 DIAGNOSIS — I69354 Hemiplegia and hemiparesis following cerebral infarction affecting left non-dominant side: Secondary | ICD-10-CM | POA: Diagnosis not present

## 2024-07-01 DIAGNOSIS — G8929 Other chronic pain: Secondary | ICD-10-CM | POA: Diagnosis not present

## 2024-07-01 DIAGNOSIS — I480 Paroxysmal atrial fibrillation: Secondary | ICD-10-CM | POA: Diagnosis not present

## 2024-07-01 DIAGNOSIS — I5032 Chronic diastolic (congestive) heart failure: Secondary | ICD-10-CM | POA: Diagnosis not present

## 2024-07-01 DIAGNOSIS — K649 Unspecified hemorrhoids: Secondary | ICD-10-CM | POA: Diagnosis not present

## 2024-07-01 DIAGNOSIS — K219 Gastro-esophageal reflux disease without esophagitis: Secondary | ICD-10-CM | POA: Diagnosis not present

## 2024-07-01 DIAGNOSIS — M1990 Unspecified osteoarthritis, unspecified site: Secondary | ICD-10-CM | POA: Diagnosis not present

## 2024-07-01 DIAGNOSIS — D649 Anemia, unspecified: Secondary | ICD-10-CM | POA: Diagnosis not present

## 2024-07-01 DIAGNOSIS — H8110 Benign paroxysmal vertigo, unspecified ear: Secondary | ICD-10-CM | POA: Diagnosis not present

## 2024-07-01 DIAGNOSIS — G3184 Mild cognitive impairment, so stated: Secondary | ICD-10-CM | POA: Diagnosis not present

## 2024-07-01 DIAGNOSIS — E10621 Type 1 diabetes mellitus with foot ulcer: Secondary | ICD-10-CM | POA: Diagnosis not present

## 2024-07-01 DIAGNOSIS — J9621 Acute and chronic respiratory failure with hypoxia: Secondary | ICD-10-CM | POA: Diagnosis not present

## 2024-07-01 DIAGNOSIS — E890 Postprocedural hypothyroidism: Secondary | ICD-10-CM | POA: Diagnosis not present

## 2024-07-01 DIAGNOSIS — K5900 Constipation, unspecified: Secondary | ICD-10-CM | POA: Diagnosis not present

## 2024-07-01 DIAGNOSIS — E7800 Pure hypercholesterolemia, unspecified: Secondary | ICD-10-CM | POA: Diagnosis not present

## 2024-07-03 ENCOUNTER — Ambulatory Visit (INDEPENDENT_AMBULATORY_CARE_PROVIDER_SITE_OTHER): Payer: Medicare PPO

## 2024-07-03 DIAGNOSIS — I442 Atrioventricular block, complete: Secondary | ICD-10-CM

## 2024-07-04 LAB — CUP PACEART REMOTE DEVICE CHECK
Battery Remaining Longevity: 94 mo
Battery Voltage: 2.99 V
Brady Statistic AP VP Percent: 37.18 %
Brady Statistic AP VS Percent: 0.74 %
Brady Statistic AS VP Percent: 60.87 %
Brady Statistic AS VS Percent: 1.22 %
Brady Statistic RA Percent Paced: 37.39 %
Brady Statistic RV Percent Paced: 97.55 %
Date Time Interrogation Session: 20250711005100
Implantable Lead Connection Status: 753985
Implantable Lead Connection Status: 753985
Implantable Lead Implant Date: 20210702
Implantable Lead Implant Date: 20210702
Implantable Lead Location: 753859
Implantable Lead Location: 753860
Implantable Lead Model: 5076
Implantable Lead Model: 5076
Implantable Pulse Generator Implant Date: 20210702
Lead Channel Impedance Value: 266 Ohm
Lead Channel Impedance Value: 342 Ohm
Lead Channel Impedance Value: 380 Ohm
Lead Channel Impedance Value: 437 Ohm
Lead Channel Pacing Threshold Amplitude: 0.625 V
Lead Channel Pacing Threshold Amplitude: 0.75 V
Lead Channel Pacing Threshold Pulse Width: 0.4 ms
Lead Channel Pacing Threshold Pulse Width: 0.4 ms
Lead Channel Sensing Intrinsic Amplitude: 10.125 mV
Lead Channel Sensing Intrinsic Amplitude: 10.125 mV
Lead Channel Sensing Intrinsic Amplitude: 3.875 mV
Lead Channel Sensing Intrinsic Amplitude: 3.875 mV
Lead Channel Setting Pacing Amplitude: 1.5 V
Lead Channel Setting Pacing Amplitude: 2.5 V
Lead Channel Setting Pacing Pulse Width: 0.4 ms
Lead Channel Setting Sensing Sensitivity: 1.2 mV
Zone Setting Status: 755011
Zone Setting Status: 755011

## 2024-07-07 DIAGNOSIS — M1990 Unspecified osteoarthritis, unspecified site: Secondary | ICD-10-CM | POA: Diagnosis not present

## 2024-07-07 DIAGNOSIS — M545 Low back pain, unspecified: Secondary | ICD-10-CM | POA: Diagnosis not present

## 2024-07-07 DIAGNOSIS — E890 Postprocedural hypothyroidism: Secondary | ICD-10-CM | POA: Diagnosis not present

## 2024-07-07 DIAGNOSIS — D649 Anemia, unspecified: Secondary | ICD-10-CM | POA: Diagnosis not present

## 2024-07-07 DIAGNOSIS — L89622 Pressure ulcer of left heel, stage 2: Secondary | ICD-10-CM | POA: Diagnosis not present

## 2024-07-07 DIAGNOSIS — G8929 Other chronic pain: Secondary | ICD-10-CM | POA: Diagnosis not present

## 2024-07-07 DIAGNOSIS — K649 Unspecified hemorrhoids: Secondary | ICD-10-CM | POA: Diagnosis not present

## 2024-07-07 DIAGNOSIS — E7800 Pure hypercholesterolemia, unspecified: Secondary | ICD-10-CM | POA: Diagnosis not present

## 2024-07-07 DIAGNOSIS — I110 Hypertensive heart disease with heart failure: Secondary | ICD-10-CM | POA: Diagnosis not present

## 2024-07-07 DIAGNOSIS — I272 Pulmonary hypertension, unspecified: Secondary | ICD-10-CM | POA: Diagnosis not present

## 2024-07-07 DIAGNOSIS — J9621 Acute and chronic respiratory failure with hypoxia: Secondary | ICD-10-CM | POA: Diagnosis not present

## 2024-07-07 DIAGNOSIS — I69354 Hemiplegia and hemiparesis following cerebral infarction affecting left non-dominant side: Secondary | ICD-10-CM | POA: Diagnosis not present

## 2024-07-07 DIAGNOSIS — I480 Paroxysmal atrial fibrillation: Secondary | ICD-10-CM | POA: Diagnosis not present

## 2024-07-07 DIAGNOSIS — H8110 Benign paroxysmal vertigo, unspecified ear: Secondary | ICD-10-CM | POA: Diagnosis not present

## 2024-07-07 DIAGNOSIS — K219 Gastro-esophageal reflux disease without esophagitis: Secondary | ICD-10-CM | POA: Diagnosis not present

## 2024-07-07 DIAGNOSIS — I442 Atrioventricular block, complete: Secondary | ICD-10-CM | POA: Diagnosis not present

## 2024-07-07 DIAGNOSIS — K5900 Constipation, unspecified: Secondary | ICD-10-CM | POA: Diagnosis not present

## 2024-07-07 DIAGNOSIS — G3184 Mild cognitive impairment, so stated: Secondary | ICD-10-CM | POA: Diagnosis not present

## 2024-07-07 DIAGNOSIS — E10621 Type 1 diabetes mellitus with foot ulcer: Secondary | ICD-10-CM | POA: Diagnosis not present

## 2024-07-07 DIAGNOSIS — I5032 Chronic diastolic (congestive) heart failure: Secondary | ICD-10-CM | POA: Diagnosis not present

## 2024-07-09 ENCOUNTER — Ambulatory Visit: Payer: Self-pay | Admitting: Cardiology

## 2024-07-14 ENCOUNTER — Encounter (HOSPITAL_BASED_OUTPATIENT_CLINIC_OR_DEPARTMENT_OTHER): Attending: Internal Medicine | Admitting: Internal Medicine

## 2024-07-14 DIAGNOSIS — L97512 Non-pressure chronic ulcer of other part of right foot with fat layer exposed: Secondary | ICD-10-CM | POA: Insufficient documentation

## 2024-07-14 DIAGNOSIS — E1042 Type 1 diabetes mellitus with diabetic polyneuropathy: Secondary | ICD-10-CM | POA: Insufficient documentation

## 2024-07-14 DIAGNOSIS — E10621 Type 1 diabetes mellitus with foot ulcer: Secondary | ICD-10-CM | POA: Insufficient documentation

## 2024-07-14 DIAGNOSIS — I890 Lymphedema, not elsewhere classified: Secondary | ICD-10-CM | POA: Diagnosis not present

## 2024-07-15 ENCOUNTER — Ambulatory Visit (INDEPENDENT_AMBULATORY_CARE_PROVIDER_SITE_OTHER): Admitting: Podiatry

## 2024-07-15 DIAGNOSIS — M79674 Pain in right toe(s): Secondary | ICD-10-CM | POA: Diagnosis not present

## 2024-07-15 DIAGNOSIS — D689 Coagulation defect, unspecified: Secondary | ICD-10-CM | POA: Diagnosis not present

## 2024-07-15 DIAGNOSIS — M79675 Pain in left toe(s): Secondary | ICD-10-CM | POA: Diagnosis not present

## 2024-07-15 DIAGNOSIS — B351 Tinea unguium: Secondary | ICD-10-CM | POA: Diagnosis not present

## 2024-07-15 DIAGNOSIS — E1159 Type 2 diabetes mellitus with other circulatory complications: Secondary | ICD-10-CM | POA: Diagnosis not present

## 2024-07-15 NOTE — Progress Notes (Signed)
This patient returns to my office for at risk foot care.  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  This patient is unable to cut nails herself  since the patient cannot reach her  nails.These nails are painful walking and wearing shoes.  Patient presents in a wheelchair  with her granddaughter..  This patient presents for at risk foot care today.  General Appearance  Alert, conversant and in no acute stress.  Vascular  Dorsalis pedis and posterior tibial  pulses are absent due to swelling. bilaterally.  Capillary return is within normal limits  bilaterally. Temperature is within normal limits  bilaterally.  Neurologic  Senn-Weinstein monofilament wire diminished  bilaterally. Muscle power within normal limits bilaterally.  Nails Thick disfigured discolored nails with subungual debris  from hallux to fifth toes bilaterally. No evidence of bacterial infection or drainage bilaterally.  Orthopedic  No limitations of motion  feet .  No crepitus or effusions noted.  No bony pathology or digital deformities noted.  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  Hallux  malleus  B/L  Skin  normotropic skin with no porokeratosis noted bilaterally.  Skin lesion on dorsum of hallux malleus  B/L.    Onychomycosis  Pain in right toes  Pain in left toes  Consent was obtained for treatment procedures.   Mechanical debridement of nails 1-5  bilaterally performed with a nail nipper.  Filed with dremel without incident.    Return office visit    3 months       Told patient to return for periodic foot care and evaluation due to potential at risk complications.   Helane Gunther DPM

## 2024-07-16 DIAGNOSIS — I5032 Chronic diastolic (congestive) heart failure: Secondary | ICD-10-CM | POA: Diagnosis not present

## 2024-07-16 DIAGNOSIS — H8110 Benign paroxysmal vertigo, unspecified ear: Secondary | ICD-10-CM | POA: Diagnosis not present

## 2024-07-16 DIAGNOSIS — E890 Postprocedural hypothyroidism: Secondary | ICD-10-CM | POA: Diagnosis not present

## 2024-07-16 DIAGNOSIS — I69354 Hemiplegia and hemiparesis following cerebral infarction affecting left non-dominant side: Secondary | ICD-10-CM | POA: Diagnosis not present

## 2024-07-16 DIAGNOSIS — J9621 Acute and chronic respiratory failure with hypoxia: Secondary | ICD-10-CM | POA: Diagnosis not present

## 2024-07-16 DIAGNOSIS — I442 Atrioventricular block, complete: Secondary | ICD-10-CM | POA: Diagnosis not present

## 2024-07-16 DIAGNOSIS — L89622 Pressure ulcer of left heel, stage 2: Secondary | ICD-10-CM | POA: Diagnosis not present

## 2024-07-16 DIAGNOSIS — I272 Pulmonary hypertension, unspecified: Secondary | ICD-10-CM | POA: Diagnosis not present

## 2024-07-16 DIAGNOSIS — G8929 Other chronic pain: Secondary | ICD-10-CM | POA: Diagnosis not present

## 2024-07-16 DIAGNOSIS — I480 Paroxysmal atrial fibrillation: Secondary | ICD-10-CM | POA: Diagnosis not present

## 2024-07-16 DIAGNOSIS — K5900 Constipation, unspecified: Secondary | ICD-10-CM | POA: Diagnosis not present

## 2024-07-16 DIAGNOSIS — E10621 Type 1 diabetes mellitus with foot ulcer: Secondary | ICD-10-CM | POA: Diagnosis not present

## 2024-07-16 DIAGNOSIS — K219 Gastro-esophageal reflux disease without esophagitis: Secondary | ICD-10-CM | POA: Diagnosis not present

## 2024-07-16 DIAGNOSIS — I110 Hypertensive heart disease with heart failure: Secondary | ICD-10-CM | POA: Diagnosis not present

## 2024-07-16 DIAGNOSIS — K649 Unspecified hemorrhoids: Secondary | ICD-10-CM | POA: Diagnosis not present

## 2024-07-16 DIAGNOSIS — M1990 Unspecified osteoarthritis, unspecified site: Secondary | ICD-10-CM | POA: Diagnosis not present

## 2024-07-16 DIAGNOSIS — E7800 Pure hypercholesterolemia, unspecified: Secondary | ICD-10-CM | POA: Diagnosis not present

## 2024-07-16 DIAGNOSIS — D649 Anemia, unspecified: Secondary | ICD-10-CM | POA: Diagnosis not present

## 2024-07-16 DIAGNOSIS — G3184 Mild cognitive impairment, so stated: Secondary | ICD-10-CM | POA: Diagnosis not present

## 2024-07-16 DIAGNOSIS — M545 Low back pain, unspecified: Secondary | ICD-10-CM | POA: Diagnosis not present

## 2024-07-22 DIAGNOSIS — E7800 Pure hypercholesterolemia, unspecified: Secondary | ICD-10-CM | POA: Diagnosis not present

## 2024-07-22 DIAGNOSIS — K649 Unspecified hemorrhoids: Secondary | ICD-10-CM | POA: Diagnosis not present

## 2024-07-22 DIAGNOSIS — D649 Anemia, unspecified: Secondary | ICD-10-CM | POA: Diagnosis not present

## 2024-07-22 DIAGNOSIS — I480 Paroxysmal atrial fibrillation: Secondary | ICD-10-CM | POA: Diagnosis not present

## 2024-07-22 DIAGNOSIS — M545 Low back pain, unspecified: Secondary | ICD-10-CM | POA: Diagnosis not present

## 2024-07-22 DIAGNOSIS — I5032 Chronic diastolic (congestive) heart failure: Secondary | ICD-10-CM | POA: Diagnosis not present

## 2024-07-22 DIAGNOSIS — L89622 Pressure ulcer of left heel, stage 2: Secondary | ICD-10-CM | POA: Diagnosis not present

## 2024-07-22 DIAGNOSIS — M1990 Unspecified osteoarthritis, unspecified site: Secondary | ICD-10-CM | POA: Diagnosis not present

## 2024-07-22 DIAGNOSIS — G3184 Mild cognitive impairment, so stated: Secondary | ICD-10-CM | POA: Diagnosis not present

## 2024-07-22 DIAGNOSIS — I442 Atrioventricular block, complete: Secondary | ICD-10-CM | POA: Diagnosis not present

## 2024-07-22 DIAGNOSIS — I272 Pulmonary hypertension, unspecified: Secondary | ICD-10-CM | POA: Diagnosis not present

## 2024-07-22 DIAGNOSIS — E890 Postprocedural hypothyroidism: Secondary | ICD-10-CM | POA: Diagnosis not present

## 2024-07-22 DIAGNOSIS — I110 Hypertensive heart disease with heart failure: Secondary | ICD-10-CM | POA: Diagnosis not present

## 2024-07-22 DIAGNOSIS — H8110 Benign paroxysmal vertigo, unspecified ear: Secondary | ICD-10-CM | POA: Diagnosis not present

## 2024-07-22 DIAGNOSIS — K219 Gastro-esophageal reflux disease without esophagitis: Secondary | ICD-10-CM | POA: Diagnosis not present

## 2024-07-22 DIAGNOSIS — K5900 Constipation, unspecified: Secondary | ICD-10-CM | POA: Diagnosis not present

## 2024-07-22 DIAGNOSIS — I69354 Hemiplegia and hemiparesis following cerebral infarction affecting left non-dominant side: Secondary | ICD-10-CM | POA: Diagnosis not present

## 2024-07-22 DIAGNOSIS — J9621 Acute and chronic respiratory failure with hypoxia: Secondary | ICD-10-CM | POA: Diagnosis not present

## 2024-07-22 DIAGNOSIS — E10621 Type 1 diabetes mellitus with foot ulcer: Secondary | ICD-10-CM | POA: Diagnosis not present

## 2024-07-22 DIAGNOSIS — G8929 Other chronic pain: Secondary | ICD-10-CM | POA: Diagnosis not present

## 2024-07-27 DIAGNOSIS — I442 Atrioventricular block, complete: Secondary | ICD-10-CM | POA: Diagnosis not present

## 2024-07-27 DIAGNOSIS — M1990 Unspecified osteoarthritis, unspecified site: Secondary | ICD-10-CM | POA: Diagnosis not present

## 2024-07-27 DIAGNOSIS — I5032 Chronic diastolic (congestive) heart failure: Secondary | ICD-10-CM | POA: Diagnosis not present

## 2024-07-27 DIAGNOSIS — I69354 Hemiplegia and hemiparesis following cerebral infarction affecting left non-dominant side: Secondary | ICD-10-CM | POA: Diagnosis not present

## 2024-07-27 DIAGNOSIS — I272 Pulmonary hypertension, unspecified: Secondary | ICD-10-CM | POA: Diagnosis not present

## 2024-07-27 DIAGNOSIS — K5900 Constipation, unspecified: Secondary | ICD-10-CM | POA: Diagnosis not present

## 2024-07-27 DIAGNOSIS — K219 Gastro-esophageal reflux disease without esophagitis: Secondary | ICD-10-CM | POA: Diagnosis not present

## 2024-07-27 DIAGNOSIS — L89622 Pressure ulcer of left heel, stage 2: Secondary | ICD-10-CM | POA: Diagnosis not present

## 2024-07-27 DIAGNOSIS — E7800 Pure hypercholesterolemia, unspecified: Secondary | ICD-10-CM | POA: Diagnosis not present

## 2024-07-27 DIAGNOSIS — I480 Paroxysmal atrial fibrillation: Secondary | ICD-10-CM | POA: Diagnosis not present

## 2024-07-27 DIAGNOSIS — I110 Hypertensive heart disease with heart failure: Secondary | ICD-10-CM | POA: Diagnosis not present

## 2024-07-28 ENCOUNTER — Encounter (HOSPITAL_BASED_OUTPATIENT_CLINIC_OR_DEPARTMENT_OTHER): Attending: Internal Medicine | Admitting: Internal Medicine

## 2024-07-28 DIAGNOSIS — E1042 Type 1 diabetes mellitus with diabetic polyneuropathy: Secondary | ICD-10-CM | POA: Insufficient documentation

## 2024-07-28 DIAGNOSIS — L97512 Non-pressure chronic ulcer of other part of right foot with fat layer exposed: Secondary | ICD-10-CM | POA: Insufficient documentation

## 2024-07-28 DIAGNOSIS — E10621 Type 1 diabetes mellitus with foot ulcer: Secondary | ICD-10-CM | POA: Insufficient documentation

## 2024-07-28 DIAGNOSIS — I890 Lymphedema, not elsewhere classified: Secondary | ICD-10-CM | POA: Diagnosis not present

## 2024-07-31 DIAGNOSIS — E039 Hypothyroidism, unspecified: Secondary | ICD-10-CM | POA: Diagnosis not present

## 2024-07-31 DIAGNOSIS — Z0000 Encounter for general adult medical examination without abnormal findings: Secondary | ICD-10-CM | POA: Diagnosis not present

## 2024-07-31 DIAGNOSIS — I5032 Chronic diastolic (congestive) heart failure: Secondary | ICD-10-CM | POA: Diagnosis not present

## 2024-07-31 DIAGNOSIS — Z1211 Encounter for screening for malignant neoplasm of colon: Secondary | ICD-10-CM | POA: Diagnosis not present

## 2024-07-31 DIAGNOSIS — R0902 Hypoxemia: Secondary | ICD-10-CM | POA: Diagnosis not present

## 2024-07-31 DIAGNOSIS — D649 Anemia, unspecified: Secondary | ICD-10-CM | POA: Diagnosis not present

## 2024-07-31 DIAGNOSIS — Z23 Encounter for immunization: Secondary | ICD-10-CM | POA: Diagnosis not present

## 2024-07-31 DIAGNOSIS — Z1159 Encounter for screening for other viral diseases: Secondary | ICD-10-CM | POA: Diagnosis not present

## 2024-07-31 DIAGNOSIS — E1069 Type 1 diabetes mellitus with other specified complication: Secondary | ICD-10-CM | POA: Diagnosis not present

## 2024-07-31 DIAGNOSIS — I480 Paroxysmal atrial fibrillation: Secondary | ICD-10-CM | POA: Diagnosis not present

## 2024-07-31 DIAGNOSIS — Z8673 Personal history of transient ischemic attack (TIA), and cerebral infarction without residual deficits: Secondary | ICD-10-CM | POA: Diagnosis not present

## 2024-07-31 DIAGNOSIS — I10 Essential (primary) hypertension: Secondary | ICD-10-CM | POA: Diagnosis not present

## 2024-07-31 DIAGNOSIS — E139 Other specified diabetes mellitus without complications: Secondary | ICD-10-CM | POA: Diagnosis not present

## 2024-07-31 DIAGNOSIS — E785 Hyperlipidemia, unspecified: Secondary | ICD-10-CM | POA: Diagnosis not present

## 2024-08-06 ENCOUNTER — Ambulatory Visit: Payer: Medicare Other | Admitting: Cardiology

## 2024-08-07 DIAGNOSIS — Z1211 Encounter for screening for malignant neoplasm of colon: Secondary | ICD-10-CM | POA: Diagnosis not present

## 2024-08-10 DIAGNOSIS — E7800 Pure hypercholesterolemia, unspecified: Secondary | ICD-10-CM | POA: Diagnosis not present

## 2024-08-10 DIAGNOSIS — K5900 Constipation, unspecified: Secondary | ICD-10-CM | POA: Diagnosis not present

## 2024-08-10 DIAGNOSIS — I110 Hypertensive heart disease with heart failure: Secondary | ICD-10-CM | POA: Diagnosis not present

## 2024-08-10 DIAGNOSIS — D649 Anemia, unspecified: Secondary | ICD-10-CM | POA: Diagnosis not present

## 2024-08-10 DIAGNOSIS — K219 Gastro-esophageal reflux disease without esophagitis: Secondary | ICD-10-CM | POA: Diagnosis not present

## 2024-08-10 DIAGNOSIS — I69354 Hemiplegia and hemiparesis following cerebral infarction affecting left non-dominant side: Secondary | ICD-10-CM | POA: Diagnosis not present

## 2024-08-10 DIAGNOSIS — M1990 Unspecified osteoarthritis, unspecified site: Secondary | ICD-10-CM | POA: Diagnosis not present

## 2024-08-10 DIAGNOSIS — H8110 Benign paroxysmal vertigo, unspecified ear: Secondary | ICD-10-CM | POA: Diagnosis not present

## 2024-08-10 DIAGNOSIS — J9621 Acute and chronic respiratory failure with hypoxia: Secondary | ICD-10-CM | POA: Diagnosis not present

## 2024-08-10 DIAGNOSIS — M545 Low back pain, unspecified: Secondary | ICD-10-CM | POA: Diagnosis not present

## 2024-08-10 DIAGNOSIS — I442 Atrioventricular block, complete: Secondary | ICD-10-CM | POA: Diagnosis not present

## 2024-08-10 DIAGNOSIS — K649 Unspecified hemorrhoids: Secondary | ICD-10-CM | POA: Diagnosis not present

## 2024-08-10 DIAGNOSIS — E890 Postprocedural hypothyroidism: Secondary | ICD-10-CM | POA: Diagnosis not present

## 2024-08-10 DIAGNOSIS — L89622 Pressure ulcer of left heel, stage 2: Secondary | ICD-10-CM | POA: Diagnosis not present

## 2024-08-10 DIAGNOSIS — I272 Pulmonary hypertension, unspecified: Secondary | ICD-10-CM | POA: Diagnosis not present

## 2024-08-10 DIAGNOSIS — I480 Paroxysmal atrial fibrillation: Secondary | ICD-10-CM | POA: Diagnosis not present

## 2024-08-10 DIAGNOSIS — G8929 Other chronic pain: Secondary | ICD-10-CM | POA: Diagnosis not present

## 2024-08-10 DIAGNOSIS — G3184 Mild cognitive impairment, so stated: Secondary | ICD-10-CM | POA: Diagnosis not present

## 2024-08-10 DIAGNOSIS — E10621 Type 1 diabetes mellitus with foot ulcer: Secondary | ICD-10-CM | POA: Diagnosis not present

## 2024-08-10 DIAGNOSIS — I5032 Chronic diastolic (congestive) heart failure: Secondary | ICD-10-CM | POA: Diagnosis not present

## 2024-08-11 DIAGNOSIS — I10 Essential (primary) hypertension: Secondary | ICD-10-CM | POA: Diagnosis not present

## 2024-08-11 DIAGNOSIS — E109 Type 1 diabetes mellitus without complications: Secondary | ICD-10-CM | POA: Diagnosis not present

## 2024-08-11 DIAGNOSIS — L97909 Non-pressure chronic ulcer of unspecified part of unspecified lower leg with unspecified severity: Secondary | ICD-10-CM | POA: Diagnosis not present

## 2024-08-11 DIAGNOSIS — E890 Postprocedural hypothyroidism: Secondary | ICD-10-CM | POA: Diagnosis not present

## 2024-08-11 DIAGNOSIS — Z794 Long term (current) use of insulin: Secondary | ICD-10-CM | POA: Diagnosis not present

## 2024-08-11 DIAGNOSIS — E11319 Type 2 diabetes mellitus with unspecified diabetic retinopathy without macular edema: Secondary | ICD-10-CM | POA: Diagnosis not present

## 2024-08-11 DIAGNOSIS — E7800 Pure hypercholesterolemia, unspecified: Secondary | ICD-10-CM | POA: Diagnosis not present

## 2024-08-11 DIAGNOSIS — C73 Malignant neoplasm of thyroid gland: Secondary | ICD-10-CM | POA: Diagnosis not present

## 2024-08-11 DIAGNOSIS — I639 Cerebral infarction, unspecified: Secondary | ICD-10-CM | POA: Diagnosis not present

## 2024-08-15 ENCOUNTER — Other Ambulatory Visit: Payer: Self-pay | Admitting: Cardiology

## 2024-08-15 DIAGNOSIS — I10 Essential (primary) hypertension: Secondary | ICD-10-CM

## 2024-08-18 ENCOUNTER — Encounter (HOSPITAL_BASED_OUTPATIENT_CLINIC_OR_DEPARTMENT_OTHER): Admitting: Internal Medicine

## 2024-08-18 DIAGNOSIS — L97512 Non-pressure chronic ulcer of other part of right foot with fat layer exposed: Secondary | ICD-10-CM

## 2024-08-18 DIAGNOSIS — E10621 Type 1 diabetes mellitus with foot ulcer: Secondary | ICD-10-CM | POA: Diagnosis not present

## 2024-08-18 DIAGNOSIS — I890 Lymphedema, not elsewhere classified: Secondary | ICD-10-CM | POA: Diagnosis not present

## 2024-08-18 DIAGNOSIS — E1042 Type 1 diabetes mellitus with diabetic polyneuropathy: Secondary | ICD-10-CM | POA: Diagnosis not present

## 2024-08-19 DIAGNOSIS — I5032 Chronic diastolic (congestive) heart failure: Secondary | ICD-10-CM | POA: Diagnosis not present

## 2024-08-19 DIAGNOSIS — H8110 Benign paroxysmal vertigo, unspecified ear: Secondary | ICD-10-CM | POA: Diagnosis not present

## 2024-08-19 DIAGNOSIS — I442 Atrioventricular block, complete: Secondary | ICD-10-CM | POA: Diagnosis not present

## 2024-08-19 DIAGNOSIS — I69354 Hemiplegia and hemiparesis following cerebral infarction affecting left non-dominant side: Secondary | ICD-10-CM | POA: Diagnosis not present

## 2024-08-19 DIAGNOSIS — I110 Hypertensive heart disease with heart failure: Secondary | ICD-10-CM | POA: Diagnosis not present

## 2024-08-19 DIAGNOSIS — G8929 Other chronic pain: Secondary | ICD-10-CM | POA: Diagnosis not present

## 2024-08-19 DIAGNOSIS — L89622 Pressure ulcer of left heel, stage 2: Secondary | ICD-10-CM | POA: Diagnosis not present

## 2024-08-19 DIAGNOSIS — I480 Paroxysmal atrial fibrillation: Secondary | ICD-10-CM | POA: Diagnosis not present

## 2024-08-19 DIAGNOSIS — K5900 Constipation, unspecified: Secondary | ICD-10-CM | POA: Diagnosis not present

## 2024-08-19 DIAGNOSIS — M1990 Unspecified osteoarthritis, unspecified site: Secondary | ICD-10-CM | POA: Diagnosis not present

## 2024-08-19 DIAGNOSIS — I272 Pulmonary hypertension, unspecified: Secondary | ICD-10-CM | POA: Diagnosis not present

## 2024-08-19 DIAGNOSIS — D649 Anemia, unspecified: Secondary | ICD-10-CM | POA: Diagnosis not present

## 2024-08-19 DIAGNOSIS — E10621 Type 1 diabetes mellitus with foot ulcer: Secondary | ICD-10-CM | POA: Diagnosis not present

## 2024-08-19 DIAGNOSIS — G3184 Mild cognitive impairment, so stated: Secondary | ICD-10-CM | POA: Diagnosis not present

## 2024-08-19 DIAGNOSIS — K649 Unspecified hemorrhoids: Secondary | ICD-10-CM | POA: Diagnosis not present

## 2024-08-19 DIAGNOSIS — J9621 Acute and chronic respiratory failure with hypoxia: Secondary | ICD-10-CM | POA: Diagnosis not present

## 2024-08-19 DIAGNOSIS — E890 Postprocedural hypothyroidism: Secondary | ICD-10-CM | POA: Diagnosis not present

## 2024-08-19 DIAGNOSIS — M545 Low back pain, unspecified: Secondary | ICD-10-CM | POA: Diagnosis not present

## 2024-08-19 DIAGNOSIS — K219 Gastro-esophageal reflux disease without esophagitis: Secondary | ICD-10-CM | POA: Diagnosis not present

## 2024-08-19 DIAGNOSIS — E7800 Pure hypercholesterolemia, unspecified: Secondary | ICD-10-CM | POA: Diagnosis not present

## 2024-08-26 DIAGNOSIS — I272 Pulmonary hypertension, unspecified: Secondary | ICD-10-CM | POA: Diagnosis not present

## 2024-08-26 DIAGNOSIS — G3184 Mild cognitive impairment, so stated: Secondary | ICD-10-CM | POA: Diagnosis not present

## 2024-08-26 DIAGNOSIS — J9621 Acute and chronic respiratory failure with hypoxia: Secondary | ICD-10-CM | POA: Diagnosis not present

## 2024-08-26 DIAGNOSIS — I69354 Hemiplegia and hemiparesis following cerebral infarction affecting left non-dominant side: Secondary | ICD-10-CM | POA: Diagnosis not present

## 2024-08-26 DIAGNOSIS — K219 Gastro-esophageal reflux disease without esophagitis: Secondary | ICD-10-CM | POA: Diagnosis not present

## 2024-08-26 DIAGNOSIS — I110 Hypertensive heart disease with heart failure: Secondary | ICD-10-CM | POA: Diagnosis not present

## 2024-08-26 DIAGNOSIS — M1990 Unspecified osteoarthritis, unspecified site: Secondary | ICD-10-CM | POA: Diagnosis not present

## 2024-08-26 DIAGNOSIS — M545 Low back pain, unspecified: Secondary | ICD-10-CM | POA: Diagnosis not present

## 2024-08-26 DIAGNOSIS — D649 Anemia, unspecified: Secondary | ICD-10-CM | POA: Diagnosis not present

## 2024-08-26 DIAGNOSIS — H8110 Benign paroxysmal vertigo, unspecified ear: Secondary | ICD-10-CM | POA: Diagnosis not present

## 2024-08-26 DIAGNOSIS — I480 Paroxysmal atrial fibrillation: Secondary | ICD-10-CM | POA: Diagnosis not present

## 2024-08-26 DIAGNOSIS — E10621 Type 1 diabetes mellitus with foot ulcer: Secondary | ICD-10-CM | POA: Diagnosis not present

## 2024-08-26 DIAGNOSIS — L89622 Pressure ulcer of left heel, stage 2: Secondary | ICD-10-CM | POA: Diagnosis not present

## 2024-08-26 DIAGNOSIS — I442 Atrioventricular block, complete: Secondary | ICD-10-CM | POA: Diagnosis not present

## 2024-08-26 DIAGNOSIS — K649 Unspecified hemorrhoids: Secondary | ICD-10-CM | POA: Diagnosis not present

## 2024-08-26 DIAGNOSIS — K5900 Constipation, unspecified: Secondary | ICD-10-CM | POA: Diagnosis not present

## 2024-08-26 DIAGNOSIS — E7800 Pure hypercholesterolemia, unspecified: Secondary | ICD-10-CM | POA: Diagnosis not present

## 2024-08-26 DIAGNOSIS — I5032 Chronic diastolic (congestive) heart failure: Secondary | ICD-10-CM | POA: Diagnosis not present

## 2024-08-26 DIAGNOSIS — G8929 Other chronic pain: Secondary | ICD-10-CM | POA: Diagnosis not present

## 2024-08-26 DIAGNOSIS — E890 Postprocedural hypothyroidism: Secondary | ICD-10-CM | POA: Diagnosis not present

## 2024-09-02 DIAGNOSIS — K5900 Constipation, unspecified: Secondary | ICD-10-CM | POA: Diagnosis not present

## 2024-09-02 DIAGNOSIS — K219 Gastro-esophageal reflux disease without esophagitis: Secondary | ICD-10-CM | POA: Diagnosis not present

## 2024-09-02 DIAGNOSIS — E7800 Pure hypercholesterolemia, unspecified: Secondary | ICD-10-CM | POA: Diagnosis not present

## 2024-09-02 DIAGNOSIS — E10621 Type 1 diabetes mellitus with foot ulcer: Secondary | ICD-10-CM | POA: Diagnosis not present

## 2024-09-02 DIAGNOSIS — L89622 Pressure ulcer of left heel, stage 2: Secondary | ICD-10-CM | POA: Diagnosis not present

## 2024-09-02 DIAGNOSIS — M545 Low back pain, unspecified: Secondary | ICD-10-CM | POA: Diagnosis not present

## 2024-09-02 DIAGNOSIS — I110 Hypertensive heart disease with heart failure: Secondary | ICD-10-CM | POA: Diagnosis not present

## 2024-09-02 DIAGNOSIS — J9621 Acute and chronic respiratory failure with hypoxia: Secondary | ICD-10-CM | POA: Diagnosis not present

## 2024-09-02 DIAGNOSIS — Z4681 Encounter for fitting and adjustment of insulin pump: Secondary | ICD-10-CM | POA: Diagnosis not present

## 2024-09-02 DIAGNOSIS — K649 Unspecified hemorrhoids: Secondary | ICD-10-CM | POA: Diagnosis not present

## 2024-09-02 DIAGNOSIS — G3184 Mild cognitive impairment, so stated: Secondary | ICD-10-CM | POA: Diagnosis not present

## 2024-09-02 DIAGNOSIS — I272 Pulmonary hypertension, unspecified: Secondary | ICD-10-CM | POA: Diagnosis not present

## 2024-09-02 DIAGNOSIS — I69354 Hemiplegia and hemiparesis following cerebral infarction affecting left non-dominant side: Secondary | ICD-10-CM | POA: Diagnosis not present

## 2024-09-02 DIAGNOSIS — D649 Anemia, unspecified: Secondary | ICD-10-CM | POA: Diagnosis not present

## 2024-09-02 DIAGNOSIS — E890 Postprocedural hypothyroidism: Secondary | ICD-10-CM | POA: Diagnosis not present

## 2024-09-02 DIAGNOSIS — I442 Atrioventricular block, complete: Secondary | ICD-10-CM | POA: Diagnosis not present

## 2024-09-02 DIAGNOSIS — I480 Paroxysmal atrial fibrillation: Secondary | ICD-10-CM | POA: Diagnosis not present

## 2024-09-02 DIAGNOSIS — I5032 Chronic diastolic (congestive) heart failure: Secondary | ICD-10-CM | POA: Diagnosis not present

## 2024-09-02 DIAGNOSIS — E109 Type 1 diabetes mellitus without complications: Secondary | ICD-10-CM | POA: Diagnosis not present

## 2024-09-02 DIAGNOSIS — G8929 Other chronic pain: Secondary | ICD-10-CM | POA: Diagnosis not present

## 2024-09-02 DIAGNOSIS — H8110 Benign paroxysmal vertigo, unspecified ear: Secondary | ICD-10-CM | POA: Diagnosis not present

## 2024-09-02 DIAGNOSIS — M1990 Unspecified osteoarthritis, unspecified site: Secondary | ICD-10-CM | POA: Diagnosis not present

## 2024-09-04 DIAGNOSIS — I5032 Chronic diastolic (congestive) heart failure: Secondary | ICD-10-CM | POA: Diagnosis not present

## 2024-09-04 DIAGNOSIS — E119 Type 2 diabetes mellitus without complications: Secondary | ICD-10-CM | POA: Diagnosis not present

## 2024-09-05 ENCOUNTER — Emergency Department (HOSPITAL_COMMUNITY)

## 2024-09-05 ENCOUNTER — Observation Stay (HOSPITAL_COMMUNITY)
Admission: EM | Admit: 2024-09-05 | Discharge: 2024-09-07 | Disposition: A | Discharge status: Home / Self Care | Attending: Family Medicine | Admitting: Family Medicine

## 2024-09-05 ENCOUNTER — Other Ambulatory Visit: Payer: Self-pay

## 2024-09-05 ENCOUNTER — Observation Stay (HOSPITAL_COMMUNITY)

## 2024-09-05 ENCOUNTER — Encounter (HOSPITAL_COMMUNITY): Payer: Self-pay

## 2024-09-05 DIAGNOSIS — Z794 Long term (current) use of insulin: Secondary | ICD-10-CM | POA: Insufficient documentation

## 2024-09-05 DIAGNOSIS — I5032 Chronic diastolic (congestive) heart failure: Secondary | ICD-10-CM | POA: Insufficient documentation

## 2024-09-05 DIAGNOSIS — K219 Gastro-esophageal reflux disease without esophagitis: Secondary | ICD-10-CM | POA: Insufficient documentation

## 2024-09-05 DIAGNOSIS — E1059 Type 1 diabetes mellitus with other circulatory complications: Secondary | ICD-10-CM | POA: Diagnosis not present

## 2024-09-05 DIAGNOSIS — Z950 Presence of cardiac pacemaker: Secondary | ICD-10-CM

## 2024-09-05 DIAGNOSIS — E890 Postprocedural hypothyroidism: Secondary | ICD-10-CM | POA: Diagnosis not present

## 2024-09-05 DIAGNOSIS — R29818 Other symptoms and signs involving the nervous system: Secondary | ICD-10-CM | POA: Diagnosis present

## 2024-09-05 DIAGNOSIS — E039 Hypothyroidism, unspecified: Secondary | ICD-10-CM | POA: Diagnosis not present

## 2024-09-05 DIAGNOSIS — Z91040 Latex allergy status: Secondary | ICD-10-CM | POA: Diagnosis not present

## 2024-09-05 DIAGNOSIS — R200 Anesthesia of skin: Secondary | ICD-10-CM | POA: Diagnosis not present

## 2024-09-05 DIAGNOSIS — I10 Essential (primary) hypertension: Secondary | ICD-10-CM | POA: Diagnosis not present

## 2024-09-05 DIAGNOSIS — M545 Low back pain, unspecified: Secondary | ICD-10-CM | POA: Diagnosis not present

## 2024-09-05 DIAGNOSIS — E1065 Type 1 diabetes mellitus with hyperglycemia: Secondary | ICD-10-CM | POA: Diagnosis not present

## 2024-09-05 DIAGNOSIS — E7800 Pure hypercholesterolemia, unspecified: Secondary | ICD-10-CM | POA: Diagnosis not present

## 2024-09-05 DIAGNOSIS — R202 Paresthesia of skin: Secondary | ICD-10-CM | POA: Diagnosis not present

## 2024-09-05 DIAGNOSIS — Z9981 Dependence on supplemental oxygen: Secondary | ICD-10-CM | POA: Diagnosis not present

## 2024-09-05 DIAGNOSIS — I442 Atrioventricular block, complete: Secondary | ICD-10-CM | POA: Diagnosis not present

## 2024-09-05 DIAGNOSIS — N179 Acute kidney failure, unspecified: Secondary | ICD-10-CM | POA: Insufficient documentation

## 2024-09-05 DIAGNOSIS — Z7901 Long term (current) use of anticoagulants: Secondary | ICD-10-CM | POA: Insufficient documentation

## 2024-09-05 DIAGNOSIS — Z85850 Personal history of malignant neoplasm of thyroid: Secondary | ICD-10-CM | POA: Diagnosis not present

## 2024-09-05 DIAGNOSIS — G4733 Obstructive sleep apnea (adult) (pediatric): Secondary | ICD-10-CM | POA: Diagnosis present

## 2024-09-05 DIAGNOSIS — N1831 Chronic kidney disease, stage 3a: Secondary | ICD-10-CM | POA: Insufficient documentation

## 2024-09-05 DIAGNOSIS — F32A Depression, unspecified: Secondary | ICD-10-CM | POA: Insufficient documentation

## 2024-09-05 DIAGNOSIS — J9611 Chronic respiratory failure with hypoxia: Secondary | ICD-10-CM | POA: Insufficient documentation

## 2024-09-05 DIAGNOSIS — Z79899 Other long term (current) drug therapy: Secondary | ICD-10-CM | POA: Insufficient documentation

## 2024-09-05 DIAGNOSIS — G8929 Other chronic pain: Secondary | ICD-10-CM

## 2024-09-05 DIAGNOSIS — Z6841 Body Mass Index (BMI) 40.0 and over, adult: Secondary | ICD-10-CM | POA: Insufficient documentation

## 2024-09-05 DIAGNOSIS — I480 Paroxysmal atrial fibrillation: Secondary | ICD-10-CM | POA: Diagnosis not present

## 2024-09-05 DIAGNOSIS — I130 Hypertensive heart and chronic kidney disease with heart failure and stage 1 through stage 4 chronic kidney disease, or unspecified chronic kidney disease: Secondary | ICD-10-CM | POA: Insufficient documentation

## 2024-09-05 DIAGNOSIS — I517 Cardiomegaly: Secondary | ICD-10-CM | POA: Diagnosis not present

## 2024-09-05 DIAGNOSIS — E1022 Type 1 diabetes mellitus with diabetic chronic kidney disease: Secondary | ICD-10-CM | POA: Insufficient documentation

## 2024-09-05 DIAGNOSIS — E66813 Obesity, class 3: Secondary | ICD-10-CM | POA: Diagnosis not present

## 2024-09-05 DIAGNOSIS — Z8673 Personal history of transient ischemic attack (TIA), and cerebral infarction without residual deficits: Secondary | ICD-10-CM | POA: Insufficient documentation

## 2024-09-05 DIAGNOSIS — E662 Morbid (severe) obesity with alveolar hypoventilation: Secondary | ICD-10-CM | POA: Insufficient documentation

## 2024-09-05 DIAGNOSIS — R0989 Other specified symptoms and signs involving the circulatory and respiratory systems: Secondary | ICD-10-CM | POA: Diagnosis not present

## 2024-09-05 DIAGNOSIS — G319 Degenerative disease of nervous system, unspecified: Secondary | ICD-10-CM | POA: Diagnosis not present

## 2024-09-05 DIAGNOSIS — G3184 Mild cognitive impairment, so stated: Secondary | ICD-10-CM | POA: Diagnosis not present

## 2024-09-05 DIAGNOSIS — R1111 Vomiting without nausea: Secondary | ICD-10-CM | POA: Diagnosis not present

## 2024-09-05 DIAGNOSIS — R0602 Shortness of breath: Secondary | ICD-10-CM | POA: Diagnosis not present

## 2024-09-05 DIAGNOSIS — I6782 Cerebral ischemia: Secondary | ICD-10-CM | POA: Diagnosis not present

## 2024-09-05 DIAGNOSIS — D696 Thrombocytopenia, unspecified: Secondary | ICD-10-CM | POA: Insufficient documentation

## 2024-09-05 DIAGNOSIS — I69354 Hemiplegia and hemiparesis following cerebral infarction affecting left non-dominant side: Secondary | ICD-10-CM

## 2024-09-05 LAB — COMPREHENSIVE METABOLIC PANEL WITH GFR
ALT: 60 U/L — ABNORMAL HIGH (ref 0–44)
AST: 57 U/L — ABNORMAL HIGH (ref 15–41)
Albumin: 3 g/dL — ABNORMAL LOW (ref 3.5–5.0)
Alkaline Phosphatase: 67 U/L (ref 38–126)
Anion gap: 12 (ref 5–15)
BUN: 26 mg/dL — ABNORMAL HIGH (ref 8–23)
CO2: 26 mmol/L (ref 22–32)
Calcium: 8.4 mg/dL — ABNORMAL LOW (ref 8.9–10.3)
Chloride: 106 mmol/L (ref 98–111)
Creatinine, Ser: 0.97 mg/dL (ref 0.44–1.00)
GFR, Estimated: >60 mL/min (ref >60)
Glucose, Bld: 102 mg/dL — ABNORMAL HIGH (ref 70–99)
Potassium: 3.8 mmol/L (ref 3.5–5.1)
Sodium: 144 mmol/L (ref 135–145)
Total Bilirubin: 0.6 mg/dL (ref 0.0–1.2)
Total Protein: 6.6 g/dL (ref 6.5–8.1)

## 2024-09-05 LAB — I-STAT VENOUS BLOOD GAS, ED
Acid-Base Excess: 9 mmol/L — ABNORMAL HIGH (ref 0.0–2.0)
Bicarbonate: 29.7 mmol/L — ABNORMAL HIGH (ref 20.0–28.0)
Calcium, Ion: 0.9 mmol/L — ABNORMAL LOW (ref 1.15–1.40)
HCT: 30 % — ABNORMAL LOW (ref 36.0–46.0)
Hemoglobin: 10.2 g/dL — ABNORMAL LOW (ref 12.0–15.0)
O2 Saturation: 100 %
Potassium: 4.1 mmol/L (ref 3.5–5.1)
Sodium: 142 mmol/L (ref 135–145)
TCO2: 31 mmol/L (ref 22–32)
pCO2, Ven: 28.5 mmHg — ABNORMAL LOW (ref 44–60)
pH, Ven: 7.627 (ref 7.25–7.43)
pO2, Ven: 202 mmHg — ABNORMAL HIGH (ref 32–45)

## 2024-09-05 LAB — CBC
HCT: 32.4 % — ABNORMAL LOW (ref 36.0–46.0)
Hemoglobin: 10 g/dL — ABNORMAL LOW (ref 12.0–15.0)
MCH: 30.1 pg (ref 26.0–34.0)
MCHC: 30.9 g/dL (ref 30.0–36.0)
MCV: 97.6 fL (ref 80.0–100.0)
Platelets: 145 K/uL — ABNORMAL LOW (ref 150–400)
RBC: 3.32 MIL/uL — ABNORMAL LOW (ref 3.87–5.11)
RDW: 12.3 % (ref 11.5–15.5)
WBC: 5 K/uL (ref 4.0–10.5)
nRBC: 0 % (ref 0.0–0.2)

## 2024-09-05 LAB — I-STAT ARTERIAL BLOOD GAS, ED
Acid-Base Excess: 6 mmol/L — ABNORMAL HIGH (ref 0.0–2.0)
Bicarbonate: 31.7 mmol/L — ABNORMAL HIGH (ref 20.0–28.0)
Calcium, Ion: 1.22 mmol/L (ref 1.15–1.40)
HCT: 31 % — ABNORMAL LOW (ref 36.0–46.0)
Hemoglobin: 10.5 g/dL — ABNORMAL LOW (ref 12.0–15.0)
O2 Saturation: 96 %
Patient temperature: 97.5
Potassium: 4.1 mmol/L (ref 3.5–5.1)
Sodium: 144 mmol/L (ref 135–145)
TCO2: 33 mmol/L — ABNORMAL HIGH (ref 22–32)
pCO2 arterial: 50.5 mmHg — ABNORMAL HIGH (ref 32–48)
pH, Arterial: 7.404 (ref 7.35–7.45)
pO2, Arterial: 79 mmHg — ABNORMAL LOW (ref 83–108)

## 2024-09-05 LAB — PROTIME-INR
INR: 1.1 (ref 0.8–1.2)
Prothrombin Time: 15.3 s — ABNORMAL HIGH (ref 11.4–15.2)

## 2024-09-05 LAB — I-STAT CHEM 8, ED
BUN: 30 mg/dL — ABNORMAL HIGH (ref 8–23)
Calcium, Ion: 0.93 mmol/L — ABNORMAL LOW (ref 1.15–1.40)
Chloride: 108 mmol/L (ref 98–111)
Creatinine, Ser: 1.1 mg/dL — ABNORMAL HIGH (ref 0.44–1.00)
Glucose, Bld: 99 mg/dL (ref 70–99)
HCT: 30 % — ABNORMAL LOW (ref 36.0–46.0)
Hemoglobin: 10.2 g/dL — ABNORMAL LOW (ref 12.0–15.0)
Potassium: 3.7 mmol/L (ref 3.5–5.1)
Sodium: 144 mmol/L (ref 135–145)
TCO2: 29 mmol/L (ref 22–32)

## 2024-09-05 LAB — CBG MONITORING, ED
Glucose-Capillary: 99 mg/dL (ref 70–99)
Glucose-Capillary: 226 mg/dL — ABNORMAL HIGH (ref 70–99)
Glucose-Capillary: 224 mg/dL — ABNORMAL HIGH (ref 70–99)
Glucose-Capillary: 201 mg/dL — ABNORMAL HIGH (ref 70–99)
Glucose-Capillary: 199 mg/dL — ABNORMAL HIGH (ref 70–99)

## 2024-09-05 LAB — DIFFERENTIAL
Abs Immature Granulocytes: 0.01 K/uL (ref 0.00–0.07)
Basophils Absolute: 0 K/uL (ref 0.0–0.1)
Basophils Relative: 1 %
Eosinophils Absolute: 0.1 K/uL (ref 0.0–0.5)
Eosinophils Relative: 2 %
Immature Granulocytes: 0 %
Lymphocytes Relative: 35 %
Lymphs Abs: 1.7 K/uL (ref 0.7–4.0)
Monocytes Absolute: 0.7 K/uL (ref 0.1–1.0)
Monocytes Relative: 14 %
Neutro Abs: 2.4 K/uL (ref 1.7–7.7)
Neutrophils Relative %: 48 %

## 2024-09-05 LAB — ETHANOL: Alcohol, Ethyl (B): <15 mg/dL (ref <15)

## 2024-09-05 LAB — BRAIN NATRIURETIC PEPTIDE: B Natriuretic Peptide: 133.5 pg/mL — ABNORMAL HIGH (ref 0.0–100.0)

## 2024-09-05 LAB — APTT: aPTT: 31 s (ref 24–36)

## 2024-09-05 LAB — BETA-HYDROXYBUTYRIC ACID: Beta-Hydroxybutyric Acid: 0.09 mmol/L (ref 0.05–0.27)

## 2024-09-05 MED ORDER — BUSPIRONE HCL 10 MG PO TABS
5.0000 mg | ORAL_TABLET | Freq: Three times a day (TID) | ORAL | Status: DC
  Administered 2024-09-05 – 2024-09-07 (×7): 5 mg via ORAL
  Filled 2024-09-05 (×7): qty 1

## 2024-09-05 MED ORDER — LEVOTHYROXINE SODIUM 75 MCG PO TABS
150.0000 ug | ORAL_TABLET | Freq: Every day | ORAL | Status: DC
  Administered 2024-09-06 – 2024-09-07 (×2): 150 ug via ORAL
  Filled 2024-09-05 (×2): qty 2

## 2024-09-05 MED ORDER — INSULIN ASPART 100 UNIT/ML IJ SOLN
0.0000 [IU] | Freq: Every day | INTRAMUSCULAR | Status: DC
  Administered 2024-09-05 – 2024-09-06 (×2): 3 [IU] via SUBCUTANEOUS

## 2024-09-05 MED ORDER — INSULIN ASPART 100 UNIT/ML IJ SOLN: 0.0000 [IU] | Freq: Three times a day (TID) | INTRAMUSCULAR | Status: DC

## 2024-09-05 MED ORDER — INSULIN ASPART 100 UNIT/ML IJ SOLN
0.0000 [IU] | Freq: Three times a day (TID) | INTRAMUSCULAR | Status: DC
  Administered 2024-09-05 – 2024-09-06 (×2): 5 [IU] via SUBCUTANEOUS
  Administered 2024-09-06: 3 [IU] via SUBCUTANEOUS
  Administered 2024-09-06 – 2024-09-07 (×3): 5 [IU] via SUBCUTANEOUS
  Administered 2024-09-07: 3 [IU] via SUBCUTANEOUS

## 2024-09-05 MED ORDER — SODIUM CHLORIDE 0.9% FLUSH
3.0000 mL | Freq: Two times a day (BID) | INTRAVENOUS | Status: DC
  Administered 2024-09-06 – 2024-09-07 (×4): 3 mL via INTRAVENOUS

## 2024-09-05 MED ORDER — GABAPENTIN 300 MG PO CAPS
300.0000 mg | ORAL_CAPSULE | Freq: Three times a day (TID) | ORAL | Status: DC
  Administered 2024-09-05 – 2024-09-07 (×7): 300 mg via ORAL
  Filled 2024-09-05 (×7): qty 1

## 2024-09-05 MED ORDER — INSULIN GLARGINE 100 UNIT/ML ~~LOC~~ SOLN
10.0000 [IU] | Freq: Every day | SUBCUTANEOUS | Status: DC
  Administered 2024-09-06 – 2024-09-07 (×2): 10 [IU] via SUBCUTANEOUS
  Filled 2024-09-05 (×3): qty 0.1

## 2024-09-05 MED ORDER — MECLIZINE HCL 25 MG PO TABS
25.0000 mg | ORAL_TABLET | Freq: Once | ORAL | Status: AC
  Administered 2024-09-05: 25 mg via ORAL
  Filled 2024-09-05: qty 1

## 2024-09-05 MED ORDER — ACETAMINOPHEN 650 MG RE SUPP: 650.0000 mg | Freq: Four times a day (QID) | RECTAL | Status: DC | PRN

## 2024-09-05 MED ORDER — POLYETHYLENE GLYCOL 3350 17 G PO PACK
17.0000 g | PACK | Freq: Every day | ORAL | Status: DC | PRN
  Administered 2024-09-06: 17 g via ORAL
  Filled 2024-09-05: qty 1

## 2024-09-05 MED ORDER — APIXABAN 5 MG PO TABS
5.0000 mg | ORAL_TABLET | Freq: Two times a day (BID) | ORAL | Status: DC
  Administered 2024-09-05 – 2024-09-07 (×4): 5 mg via ORAL
  Filled 2024-09-05 (×4): qty 1

## 2024-09-05 MED ORDER — LORAZEPAM 1 MG PO TABS
1.0000 mg | ORAL_TABLET | Freq: Once | ORAL | Status: AC | PRN
  Administered 2024-09-05: 1 mg via ORAL
  Filled 2024-09-05: qty 1

## 2024-09-05 MED ORDER — LORAZEPAM 1 MG PO TABS
1.0000 mg | ORAL_TABLET | Freq: Once | ORAL | Status: AC
  Administered 2024-09-05: 1 mg via ORAL
  Filled 2024-09-05: qty 1

## 2024-09-05 MED ORDER — ACETAMINOPHEN 325 MG PO TABS
650.0000 mg | ORAL_TABLET | Freq: Four times a day (QID) | ORAL | Status: DC | PRN
  Filled 2024-09-05: qty 2

## 2024-09-05 MED ORDER — ATORVASTATIN CALCIUM 80 MG PO TABS
80.0000 mg | ORAL_TABLET | Freq: Every evening | ORAL | Status: DC
  Administered 2024-09-05 – 2024-09-07 (×3): 80 mg via ORAL
  Filled 2024-09-05: qty 1
  Filled 2024-09-05: qty 2
  Filled 2024-09-05: qty 1

## 2024-09-05 MED ORDER — PANTOPRAZOLE SODIUM 40 MG PO TBEC
40.0000 mg | DELAYED_RELEASE_TABLET | Freq: Two times a day (BID) | ORAL | Status: DC
  Administered 2024-09-05 – 2024-09-07 (×4): 40 mg via ORAL
  Filled 2024-09-05 (×4): qty 1

## 2024-09-05 NOTE — ED Notes (Signed)
 Pt to MRI with SWOT nurse

## 2024-09-05 NOTE — H&P (Addendum)
 History and Physical   [name] FMW:[id number] DOB: 1951/04/02 DOA: 09/05/2024  PCP: Leonel Cole, MD   Patient coming from: Home  Chief Complaint: Right facial numbness  HPI: [name] is a 73 y.o. female with medical history significant of hypertension, hyperlipidemia, GERD, diabetes, CVA, hypothyroidism, atrial fibrillation, complete heart block status post pacemaker, diastolic CHF, mild cognitive impairment, depression, obesity, OSA, OHS, chronic respiratory failure, CKD 3A, low back pain presenting with right facial numbness.  Patient reports that she last remembered being normal at 1 AM overnight when she woke up to trial the TV.  She woke again at 3 AM and noted to have right facial numbness with some associated dizziness and blurred vision without double vision since.  History of multiple prior CVAs.  She is on Eliquis , statin.  Denies fevers, chills, chest pain, shortness breath, Donnell pain, constipation, diarrhea, nausea, vomiting.  ED Course: Vital signs in the ED notable for blood pressure in the 110s-160s systolic, heart rate in the 50s.  Lab workup included CMP with BUN 26, glucose 102, calcium  8.4, albumin 6.0, AST 57, ALT 60.  CBC with hemoglobin stable at 10.0.  PT mildly elevated 15.3.  PTT and INR normal.  BNP pending.  Ethanol level negative.  UDS pending.  Hydroxybutyric acid negative.  VBG with pH 7.6 and pCO2 28, followed by ABG an hour later which showed normal pH and pCO2 mildly elevated at 50.5.  Chest x-ray showed stable cardiomegaly with mild vascular congestion.  CT head showed no acute abnormality.  Patient received meclizine  in the ED.  MRI brain has been ordered but there has been delay in this due to her having a pacemaker.  This is listed as compatible in her chart after being placed in 2021.  She has had a subsequent MRI in 2022 after placement of the pacemaker.  Review of Systems: As per HPI otherwise all other systems  reviewed and are negative.  Past Medical History:  Diagnosis Date   Abnormal echocardiogram    Anemia    Arthritis    BPPV (benign paroxysmal positional vertigo), right 06/13/2017   Bradycardia 03/07/2020   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   CHB (complete heart block) (HCC) 03/08/2020   CHF (congestive heart failure) (HCC)    Closed fracture of left proximal humerus 07/08/2019   Decreased range of motion of left shoulder 09/03/2020   Depression    Dizzy 03/07/2020   bradycardia   Edentulous 09/03/2020   GERD (gastroesophageal reflux disease)    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  Severe chronic microvascular ischemic disease of the pons, left middle cerebellar peduncle, and mini remote lacunar infarcts, progressive since 2019.  Remote pontine microhemorrhage most likely of hypertensive etiology.  10/10/21 CT reveals chronic atrophic changes.     Malignant tumor of thyroid  gland (HCC) 03/27/2022   Near syncope 03/08/2020   Paroxysmal atrial fibrillation (HCC)    Presence of  permanent cardiac pacemaker    Proteinuria 03/08/2020   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 06/25/20 06/26/2020   Seasonal allergies    Stroke (HCC)    Suicidal ideation 11/16/2021   Thyroid  cancer (HCC)    per pt S/p Total Thyroidectomy with Radioactive Iodine Therapy   Type 1 diabetes mellitus (HCC)     Past Surgical History:  Procedure  Laterality Date   ABDOMINAL ANGIOGRAM N/A 05/14/2012   Procedure: ABDOMINAL ANGIOGRAM;  Surgeon: Erick JONELLE Bergamo, MD;  Location: [facility] CATH LAB;  Service: Cardiovascular;  Laterality: N/A;   ABDOMINAL HYSTERECTOMY  1995   partial   ANKLE FRACTURE SURGERY Right    CARDIAC CATHETERIZATION     with coronary angiogram   cataract  Bilateral 2018   COLONOSCOPY N/A 09/09/2014   Procedure: COLONOSCOPY;  Surgeon: Lupita FORBES Commander, MD;  Location: [facility] ENDOSCOPY;  Service: Endoscopy;  Laterality: N/A;   ESOPHAGOGASTRODUODENOSCOPY (EGD) WITH PROPOFOL  N/A 12/03/2018   Procedure: ESOPHAGOGASTRODUODENOSCOPY (EGD) WITH PROPOFOL ;  Surgeon: Legrand Victory LITTIE DOUGLAS, MD;  Location: [facility] ENDOSCOPY;  Service: Gastroenterology;  Laterality: N/A;   LEFT AND RIGHT HEART CATHETERIZATION WITH CORONARY ANGIOGRAM N/A 05/14/2012   Procedure: LEFT AND RIGHT HEART CATHETERIZATION WITH CORONARY ANGIOGRAM;  Surgeon: Erick JONELLE Bergamo, MD;  Location: [facility] CATH LAB;  Service: Cardiovascular;  Laterality: N/A;   LOOP RECORDER INSERTION N/A 12/23/2018   Procedure: LOOP RECORDER INSERTION;  Surgeon: Inocencio Soyla Lunger, MD;  Location: [facility] INVASIVE CV LAB;  Service: Cardiovascular;  Laterality: N/A;   LOOP RECORDER REMOVAL N/A 06/25/2020   Procedure: LOOP RECORDER REMOVAL;  Surgeon: Inocencio Soyla Lunger, MD;  Location: [facility] INVASIVE CV LAB;  Service: Cardiovascular;  Laterality: N/A;   MINI METER   09/08/2014   ELECTRONIC INSULIN  PUMP   PACEMAKER IMPLANT N/A 06/25/2020   Procedure: PACEMAKER IMPLANT;  Surgeon: Inocencio Soyla Lunger, MD;  Location: [facility] INVASIVE CV LAB;  Service: Cardiovascular;  Laterality: N/A;   TEE WITHOUT CARDIOVERSION N/A 10/16/2018   Procedure: TRANSESOPHAGEAL ECHOCARDIOGRAM (TEE);  Surgeon: Lonni Slain, MD;  Location: [facility] ENDOSCOPY;  Service: Cardiovascular;  Laterality: N/A;   THYROIDECTOMY  2006   for thyroid  cancer    Social History  reports that she has never smoked. She has never used smokeless tobacco.  She reports that she does not drink alcohol and does not use drugs.  Allergies  Allergen Reactions   Latex Rash    Family History  Problem Relation Age of Onset   Heart disease Mother    Hyperlipidemia Mother    Hypertension Mother    Renal Disease Mother        insufficiency   Heart attack Mother        in his 27's   Early death Father        Head Injury at Work   Hyperlipidemia Father    Hypertension Father    Stroke Son    Diabetes Maternal Aunt    Prostate cancer Paternal Uncle    Heart disease Maternal Aunt    Heart failure Maternal Grandfather    Heart disease Maternal Grandfather    Heart attack Maternal Grandfather        Died in his 3's   Breast cancer Neg Hx   Reviewed on admission  Prior to Admission medications   Medication Sig Start Date End Date Taking? Authorizing Provider  acetaminophen  (TYLENOL ) 650 MG CR tablet Take 650 mg by mouth daily as needed (severe pain).    [provider]  albuterol  (VENTOLIN  HFA) 108 (90 Base) MCG/ACT inhaler Inhale 2 puffs into the lungs every 6 (six) hours as needed for wheezing or shortness of breath. 02/18/24   Hunsucker, Donnice SAUNDERS, MD  amLODipine  (NORVASC ) 2.5 MG tablet Take 2.5 mg by mouth daily.    [provider]  apixaban  (ELIQUIS ) 5 MG TABS tablet Take 1 tablet (5 mg total) by mouth 2 (two) times daily. 10/15/23   Ladona Heinz, MD  atorvastatin  (LIPITOR ) 80 MG tablet TAKE 1 TABLET(80 MG) BY MOUTH EVERY EVENING 10/23/22   Marlee Lynwood NOVAK, MD  b complex vitamins capsule Take 1 capsule by mouth daily.    [provider]  budesonide -formoterol  (SYMBICORT ) 80-4.5 MCG/ACT inhaler Inhale 2 puffs into the lungs 2 (two) times daily. 02/18/24   Hunsucker, Donnice SAUNDERS, MD  busPIRone  (BUSPAR ) 5 MG tablet TAKE 1 TABLET(5 MG) BY MOUTH THREE TIMES DAILY 02/02/23   Marlee Lynwood NOVAK, MD  Cholecalciferol  (VITAMIN D -3 PO) Take 1 capsule by mouth daily.    [provider]  fluticasone  (FLONASE ) 50 MCG/ACT nasal  spray Place 1 spray into both nostrils daily. 11/27/23   [provider]  furosemide  (LASIX ) 40 MG tablet Take 1 tablet (40 mg total) by mouth 2 (two) times daily. 02/06/23   Custovic, Sabina, DO  gabapentin  (NEURONTIN ) 300 MG capsule Take 1 capsule (300 mg total) by mouth as needed. Patient taking differently: Take 300 mg by mouth 3 (three) times daily. 01/09/23   Sanford, James B, MD  HUMALOG 100 UNIT/ML injection Inject 3-5 Units into the skin 3 (three) times daily with meals as needed for high blood sugar. 07/23/23   [provider]  levothyroxine  (SYNTHROID ) 150 MCG tablet Take 1 tablet (150 mcg total) by mouth daily before breakfast. 12/12/23 02/10/24  Laurence Locus, DO  loratadine  (CLARITIN ) 10 MG tablet Take 10 mg by mouth daily.    [provider]  losartan  (COZAAR ) 25 MG tablet TAKE 1 TABLET(25 MG) BY MOUTH DAILY 08/19/24   Ladona Heinz, MD  MAGNESIUM -POTASSIUM PO Take 1 each by mouth every evening. Potassium-magnesium  combination gummy.    [provider]  OXYGEN  Inhale 2 L into the lungs continuous.    [provider]  pantoprazole  (PROTONIX ) 40 MG tablet Take 1 tablet (40 mg total) by mouth 2 (two) times daily. 10/27/21   Medina-Vargas, Monina C, NP  polyethylene glycol powder (GLYCOLAX /MIRALAX ) 17 GM/SCOOP powder Take 17 g by mouth 2 (two) times daily as needed for moderate constipation. Patient taking differently: Take 17 g by mouth daily. 06/21/22   [name], Makiera, MD  TRESIBA FLEXTOUCH 100 UNIT/ML FlexTouch Pen Inject 20 Units into the skin daily. 09/04/23   [provider]  NAPOLEON INHUB 500-50 MCG/ACT AEPB Inhale 1 puff into the lungs in the morning and at bedtime. 08/31/23   [provider]    Physical Exam: Vitals:   09/05/24 1155 09/05/24 1200 09/05/24 1215 09/05/24 1230  BP:  (!) 165/50 (!) 163/63 (!) 154/51  Pulse:  (!) 58 (!) 57 (!) 55  Resp:  14 15 10   Temp: 97.6 F (36.4 C)     TempSrc: Axillary     SpO2:   100% 100% 100%    Physical Exam Constitutional:      General: She is not in acute distress.    Appearance: Normal appearance. She is obese.  HENT:     Head: Normocephalic and atraumatic.     Mouth/Throat:     Mouth: Mucous membranes are moist.  Pharynx: Oropharynx is clear.  Eyes:     Extraocular Movements: Extraocular movements intact.     Pupils: Pupils are equal, round, and reactive to light.  Cardiovascular:     Rate and Rhythm: Normal rate and regular rhythm.     Pulses: Normal pulses.     Heart sounds: Normal heart sounds.  Pulmonary:     Effort: Pulmonary effort is normal. No respiratory distress.     Breath sounds: Normal breath sounds.  Abdominal:     General: Bowel sounds are normal. There is no distension.     Palpations: Abdomen is soft.     Tenderness: There is no abdominal tenderness.  Musculoskeletal:        General: No swelling or deformity.  Skin:    General: Skin is warm and dry.  Neurological:     Mental Status: Mental status is at baseline.     Comments: Alert and oriented.  Chronic left lower extremity deficits.  Strength and sensation in other extremities grossly intact.  Decree sensation right face.  Patient was seen on her way to MRI, neurologic exam was abbreviated.     Labs on Admission: I have personally reviewed following labs and imaging studies  CBC: Recent Labs  Lab 09/05/24 0753 09/05/24 0834 09/05/24 0847 09/05/24 0951  WBC 5.0  --   --   --   NEUTROABS 2.4  --   --   --   HGB 10.0* 10.2* 10.2* 10.5*  HCT 32.4* 30.0* 30.0* 31.0*  MCV 97.6  --   --   --   PLT 145*  --   --   --     Basic Metabolic Panel: Recent Labs  Lab 09/05/24 0753 09/05/24 0834 09/05/24 0847 09/05/24 0951  NA 144 144 142 144  K 3.8 3.7 4.1 4.1  CL 106 108  --   --   CO2 26  --   --   --   GLUCOSE 102* 99  --   --   BUN 26* 30*  --   --   CREATININE 0.97 1.10*  --   --   CALCIUM  8.4*  --   --   --     GFR: CrCl cannot be calculated (Unknown  ideal weight.).  Liver Function Tests: Recent Labs  Lab 09/05/24 0753  AST 57*  ALT 60*  ALKPHOS 67  BILITOT 0.6  PROT 6.6  ALBUMIN 3.0*    Urine analysis:    Component Value Date/Time   COLORURINE YELLOW 12/11/2023 1127   APPEARANCEUR HAZY (A) 12/11/2023 1127   LABSPEC 1.023 12/11/2023 1127   PHURINE 5.0 12/11/2023 1127   GLUCOSEU NEGATIVE 12/11/2023 1127   HGBUR NEGATIVE 12/11/2023 1127   BILIRUBINUR NEGATIVE 12/11/2023 1127   BILIRUBINUR negative 10/29/2018 1430   BILIRUBINUR Negative 11/24/2016 0922   KETONESUR NEGATIVE 12/11/2023 1127   PROTEINUR NEGATIVE 12/11/2023 1127   UROBILINOGEN 0.2 10/29/2018 1430   UROBILINOGEN 0.2 08/29/2015 0947   NITRITE NEGATIVE 12/11/2023 1127   LEUKOCYTESUR NEGATIVE 12/11/2023 1127    Radiological Exams on Admission: DG Chest Portable 1 View Result Date: 09/05/2024 CLINICAL DATA:  Shortness of breath. EXAM: PORTABLE CHEST 1 VIEW COMPARISON:  December 10, 2023. FINDINGS: Stable cardiomegaly with mild central pulmonary vascular congestion. Left-sided pacemaker is unchanged in position. Bony thorax is unremarkable. IMPRESSION: Stable cardiomegaly with mild central pulmonary vascular congestion. Electronically Signed   By: Lynwood Landy Raddle M.D.   On: 09/05/2024 10:09   CT HEAD WO CONTRAST Result  Date: 09/05/2024 CLINICAL DATA:  73 year old female with right eye vision changes, right facial numbness. Symptoms discovered at 0300 hours. EXAM: CT HEAD WITHOUT CONTRAST TECHNIQUE: Contiguous axial images were obtained from the base of the skull through the vertex without intravenous contrast. RADIATION DOSE REDUCTION: This exam was performed according to the departmental dose-optimization program which includes automated exposure control, adjustment of the mA and/or kV according to patient size and/or use of iterative reconstruction technique. COMPARISON:  Brain MRI 08/05/2021.  Head CT 12/10/2023. FINDINGS: Brain: Stable cerebral volume from last  year, normal for age. Chronic basal ganglia vascular calcifications. No midline shift, ventriculomegaly, mass effect, evidence of mass lesion, intracranial hemorrhage or evidence of cortically based acute infarction. Extensive chronic brainstem lacunar infarcts, heterogeneity appears stable. Otherwise gray-white differentiation stable and within normal limits for age. ASPECTS 10. Vascular: Extensive Calcified atherosclerosis at the skull base. No suspicious intracranial vascular hyperdensity. Skull: Congenital incomplete ossification of the posterior C1 ring, normal variant. Calvarium intact. No acute osseous abnormality identified. Sinuses/Orbits: Visualized paranasal sinuses and mastoids are stable and well aerated. Other: No gaze deviation. Chronic postoperative changes to the orbits appear stable. Visualized scalp soft tissues are within normal limits. IMPRESSION: No acute intracranial abnormality. Stable non contrast CT appearance of advanced chronic brainstem small vessel disease. Electronically Signed   By: VEAR Hurst M.D.   On: 09/05/2024 09:33   EKG: Independently reviewed.  Sinus rhythm at 55 bpm.  Paced rhythm at 55 bpm.  Nonspecific T wave changes.  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)   Obesity, Class III, BMI 40-49.9 (morbid obesity)   Chronic diastolic CHF (congestive heart failure) (HCC)   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)   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 06/25/20   Essential hypertension   Chronic respiratory failure with hypoxia, on home O2 therapy (HCC) - 2 L/min O2   Obesity hypoventilation syndrome (HCC)   GERD (gastroesophageal reflux disease)   Depression   OSA (obstructive sleep apnea)   Paroxysmal atrial fibrillation (HCC)   Complete heart block  (HCC)   Chronic lower back pain   Mild cognitive impairment   Hypercholesterolemia   History of CVA (cerebrovascular accident)   Focal neurologic deficit Rule out CVA History of CVA > Patient presenting with new onset right-sided facial numbness with some associated dizziness and blurry vision without double vision. > History of prior CVAs.  On atorvastatin  and Eliquis . > CT head showed no acute Sharol.  Working on pacemaker related barriers for MRI brain, though pacemaker is listed as MRI compatible. > Will consult neurology if MRI brain is positive or other concerns. - Monitor on telemetry overnight - MRI brain - Neurology consult, when MRI result is available - Permissive hypertension - Stroke swallow study - Further stroke workup if MRI positive  Mild LFT elevation > AST 57, ALT 60 - Trend LFTs for now  Hypertension - Holding antihypertensives in the setting as above  Hyperlipidemia - Continue home atorvastatin   GERD - Continue PPI  Hypothyroidism - Continue home Synthroid   Atrial fibrillation - Continue Eliquis   Complete heart block - Status post pacemaker  Chronic diastolic CHF > Last echo in 2024 showed EF 60-60%, unable to determine diastolic function, normal RV function. - Hold Lasix  as above  Diabetes > 20U long acting Daily at home - 10U  long acting daily - SSI  Depression - Continue BuSpar   Obesity - Noted  OHS OSA - Continue home CPAP  Current respiratory failure with hypoxia - Continue submental oxygen  as needed  CKD 3A - Creatinine stable in the ED - Trend renal function and electrolyte  DVT prophylaxis: Eliquis  Code Status:   Full Family Communication:  None on admission  Disposition Plan:   Patient is from:  Home  Anticipated DC to:  Home  Anticipated DC date:  1 to 4 days (depending on how prolonged MRI delay is)  Anticipated DC barriers: None  Consults called:  None, anticipate likely neurology consult if MRI  returns. Admission status:  Observation, telemetry  Severity of Illness: The appropriate patient status for this patient is OBSERVATION. Observation status is judged to be reasonable and necessary in order to provide the required intensity of service to ensure the patient's safety. The patient's presenting symptoms, physical exam findings, and initial radiographic and laboratory data in the context of their medical condition is felt to place them at decreased risk for further clinical deterioration. Furthermore, it is anticipated that the patient will be medically stable for discharge from the hospital within 2 midnights of admission.    Marsa KATHEE Scurry MD [facility]  How to contact the [facility] Attending or Consulting provider 7A - 7P or covering provider during after hours 7P -7A, for this patient?   Check the care team in [facility] and look for a) attending/consulting [facility] provider listed and b) the [facility] team listed Log into [email or web address] and use [facility] universal password to access. If you do not have the password, please contact the hospital operator. Locate the [facility] provider you are looking for under [facility] and page to a number that you can be directly reached. If you still have difficulty reaching the provider, please page the [facility] (Director on Call) for the Hospitalists listed on amion for assistance.  09/05/2024, 1:03 PM

## 2024-09-05 NOTE — ED Notes (Signed)
 Patient unable to complete MRI d/t pacemaker incompatibility. Patient is also anxious about multiple concerns and is difficult to redirect.

## 2024-09-05 NOTE — ED Provider Notes (Signed)
 [place] [facility] AT [facility] Provider Note   CSN: [id number] Arrival date & time: 09/05/24  9257     Patient presents with: No chief complaint on file.   [name] is a 73 y.o. female with past medical history significant for type 1 diabetes, hypothyroidism, GERD, diastolic CHF, paroxysmal A-fib, pacemaker in place, history of complete heart block, on Eliquis , with history of 3 previous CVA per patient who presents with concern for new onset right sided facial numbness.  Last known well was at 1 AM when she woke up to turn off the TV.  She woke up again at around 3 AM with facial numbness.  She presents to the emergency department at 745.  Her only deficit remains right sided facial numbness.  She denies any weakness, aphasia, confusion, numbness of arm or leg, she reports some chronic lower extremity weakness.  She is quite hyperglycemic, reports that there was a recent change to her insulin , she has had difficulty.   HPI     Prior to Admission medications   Medication Sig Start Date End Date Taking? Authorizing Provider  acetaminophen  (TYLENOL ) 650 MG CR tablet Take 650 mg by mouth daily as needed (severe pain).    [provider]  albuterol  (VENTOLIN  HFA) 108 (90 Base) MCG/ACT inhaler Inhale 2 puffs into the lungs every 6 (six) hours as needed for wheezing or shortness of breath. 02/18/24   Hunsucker, Donnice SAUNDERS, MD  amLODipine  (NORVASC ) 2.5 MG tablet Take 2.5 mg by mouth daily.    [provider]  apixaban  (ELIQUIS ) 5 MG TABS tablet Take 1 tablet (5 mg total) by mouth 2 (two) times daily. 10/15/23   Ladona Heinz, MD  atorvastatin  (LIPITOR ) 80 MG tablet TAKE 1 TABLET(80 MG) BY MOUTH EVERY EVENING Patient taking differently: Take 80 mg by mouth every evening. 10/23/22   Sanford, James B, MD  b complex vitamins capsule Take 1 capsule by mouth daily.    [provider]  budesonide -formoterol  (SYMBICORT ) 80-4.5 MCG/ACT  inhaler Inhale 2 puffs into the lungs 2 (two) times daily. 02/18/24   Hunsucker, Donnice SAUNDERS, MD  busPIRone  (BUSPAR ) 5 MG tablet TAKE 1 TABLET(5 MG) BY MOUTH THREE TIMES DAILY 02/02/23   Marlee Lynwood NOVAK, MD  Cholecalciferol  (VITAMIN D -3 PO) Take 1 capsule by mouth daily.    [provider]  fluticasone  (FLONASE ) 50 MCG/ACT nasal spray Place 1 spray into both nostrils daily. 11/27/23   [provider]  furosemide  (LASIX ) 40 MG tablet Take 1 tablet (40 mg total) by mouth 2 (two) times daily. 02/06/23   Custovic, Sabina, DO  gabapentin  (NEURONTIN ) 300 MG capsule Take 1 capsule (300 mg total) by mouth as needed. Patient taking differently: Take 300 mg by mouth 3 (three) times daily. 01/09/23   Sanford, James B, MD  HUMALOG 100 UNIT/ML injection Inject 3-5 Units into the skin 3 (three) times daily with meals as needed for high blood sugar. 07/23/23   [provider]  levothyroxine  (SYNTHROID ) 150 MCG tablet Take 1 tablet (150 mcg total) by mouth daily before breakfast. 12/12/23 02/10/24  Laurence Locus, DO  loratadine  (CLARITIN ) 10 MG tablet Take 10 mg by mouth daily.    [provider]  losartan  (COZAAR ) 25 MG tablet TAKE 1 TABLET(25 MG) BY MOUTH DAILY 08/19/24   Ladona Heinz, MD  MAGNESIUM -POTASSIUM PO Take 1 each by mouth every evening. Potassium-magnesium  combination gummy.    [provider]  OXYGEN  Inhale 2 L into the lungs  continuous.    [provider]  pantoprazole  (PROTONIX ) 40 MG tablet Take 1 tablet (40 mg total) by mouth 2 (two) times daily. 10/27/21   Medina-Vargas, Monina C, NP  polyethylene glycol powder (GLYCOLAX /MIRALAX ) 17 GM/SCOOP powder Take 17 g by mouth 2 (two) times daily as needed for moderate constipation. Patient taking differently: Take 17 g by mouth daily. 06/21/22   [name], Makiera, MD  TRESIBA FLEXTOUCH 100 UNIT/ML FlexTouch Pen Inject 20 Units into the skin daily. 09/04/23   [provider]  NAPOLEON INHUB 500-50 MCG/ACT  AEPB Inhale 1 puff into the lungs in the morning and at bedtime. 08/31/23   [provider]    Allergies: Latex    Review of Systems  All other systems reviewed and are negative.   Updated Vital Signs BP (!) 142/53   Pulse (!) 58   Resp 17   SpO2 95%   Physical Exam Vitals and nursing note reviewed.  Constitutional:      General: She is not in acute distress.    Appearance: Normal appearance.  HENT:     Head: Normocephalic and atraumatic.  Eyes:     General:        Right eye: No discharge.        Left eye: No discharge.  Cardiovascular:     Rate and Rhythm: Normal rate and regular rhythm.     Heart sounds: No murmur heard.    No friction rub. No gallop.  Pulmonary:     Effort: Pulmonary effort is normal.     Breath sounds: Normal breath sounds.  Abdominal:     General: Bowel sounds are normal.     Palpations: Abdomen is soft.  Musculoskeletal:     Comments: Cam walker boot noted on left lower extremity. 4/5 strength of bil lower extremities.  Skin:    General: Skin is warm and dry.     Capillary Refill: Capillary refill takes less than 2 seconds.  Neurological:     Mental Status: She is alert and oriented to person, place, and time.     Comments: Cranial nerves II through XII grossly intact.  Intact finger-nose, intact heel-to-shin.  Romberg negative, gait normal.  Alert and oriented x3.  Moves all 4 limbs spontaneously, normal coordination.  No pronator drift.  Intact strength 5 out of 5 bilateral upper extremities, 4/5 lower extremities.  Numbness noted of right face in all trigeminal branches  Psychiatric:        Mood and Affect: Mood normal.        Behavior: Behavior normal.     (all labs ordered are listed, but only abnormal results are displayed) Labs Reviewed - No data to display  EKG: None  Radiology: No results found.   Procedures   Medications Ordered in the ED - No data to display                                  Medical Decision  Making Amount and/or Complexity of Data Reviewed Labs: ordered. Radiology: ordered.   This patient is a 73 y.o. female  who presents to the ED for concern of facial numbness, hyperglycemia.   Differential diagnoses prior to evaluation: The emergent differential diagnosis includes, but is not limited to,  CVA, spinal cord injury, ACS, arrhythmia, syncope, orthostatic hypotension, sepsis, hypoglycemia, hypoxia, electrolyte disturbance, endocrine disorder, anemia, environmental exposure, polypharmacy . This is not an exhaustive differential.  Past Medical History / Co-morbidities / Social History: type 1 diabetes, hypothyroidism, GERD, diastolic CHF, paroxysmal A-fib, pacemaker in place, history of complete heart block, on Eliquis , with history of 3 previous CVA  Additional history: Chart reviewed. Pertinent results include: reviewed labwork, imaging from previous ED visits  Physical Exam: Physical exam performed. The pertinent findings include: Cranial nerves II through XII grossly intact.  Intact finger-nose, intact heel-to-shin.  Romberg negative, gait normal.  Alert and oriented x3.  Moves all 4 limbs spontaneously, normal coordination.  No pronator drift.  Intact strength 5 out of 5 bilateral upper extremities, 4/5 lower extremities.  Numbness noted of right face in all trigeminal branches   Cam walker boot noted on left lower extremity. 4/5 strength of bil lower extremities.   Lab Tests/Imaging studies: I personally interpreted labs/imaging and the pertinent results include: CBC notable for anemia, hemoglobin 10.0, around stable compared to baseline.  VBG with significant alkalosis, 7.63 pH, pCO2 of 28.5.  Suspect respiratory alkalosis without compensation.  CMP with mildly elevated BUN at 26, normal creatinine, mildly elevated AST, ALT, 57, 60.  Normal total bilirubin.  Her initial CBG was 99 despite report of greater than 400 per EMS.  She does wear Dexcom, but quite a fast correction  compared to expected.  CT head with no evidence of acute abnormality.  Given her new onset facial numbness, dizziness, reports of blurred vision I do think that patient would benefit from MRI.  She additionally endorses ongoing shortness of breath.  Suspect some commendation of her obstructive sleep apnea, diastolic heart failure.  Incompatible with MRI due to her pacemaker.  I agree with the radiologist interpretation.  Cardiac monitoring: EKG obtained and interpreted by myself and attending physician which shows: Normal sinus rhythm, paced rhythm, frequent PACs   Medications: I ordered medication including meclizine  for dizziness.  I have reviewed the patients home medicines and have made adjustments as needed.   Consults: I spoke with the hospitalist, Dr. Seena who agrees to admission for new right sided facial numbness, dizziness, blurred vision, for inpatient MR in context of her pacemaker  Disposition: After consideration of the diagnostic results and the patients response to treatment, I feel that patient would benefit from admission as discussed above .   Final diagnoses:  None    ED Discharge Orders     None          Rosan Sherlean VEAR DEVONNA 09/05/24 1259    Mannie Pac T, DO 09/07/24 1515

## 2024-09-05 NOTE — CV Procedure (Signed)
  Device system confirmed to be MRI conditional, with implant date > 6 weeks ago, and no evidence of abandoned or epicardial leads in review of most recent CXR  Device last cleared by EP Provider: Prentice Passey 09/05/24  Clearance is good through for 1 year as long as parameters remain stable at time of check. If pt undergoes a cardiac device procedure during that time, they should be re-cleared.   Tachy-therapies to be programmed off if applicable with device back to pre-MRI settings after completion of exam.  [employer] - Programming recommendation received through [employer] App/Tablet  Rocky Catalan, RT  09/05/2024 1:29 PM

## 2024-09-05 NOTE — ED Triage Notes (Signed)
 Pt BIB [facility] from home with c/o right sided facial numbness and visual disturbance in her right eye. Pt went to bed at 11, woke up at 0100 to turn tv off still at baseline, woke up again at 0300 and noticed her vision was different and had right sided facial numbness. Pt on 2 L O2 at baseline. Reports she recently switched to omnipod for her insulin  and has been having trouble with high blood sugar since.   CBG 475 160/72 62

## 2024-09-06 DIAGNOSIS — R29818 Other symptoms and signs involving the nervous system: Secondary | ICD-10-CM

## 2024-09-06 LAB — COMPREHENSIVE METABOLIC PANEL WITH GFR
ALT: 46 U/L — ABNORMAL HIGH (ref 0–44)
AST: 35 U/L (ref 15–41)
Albumin: 2.6 g/dL — ABNORMAL LOW (ref 3.5–5.0)
Alkaline Phosphatase: 59 U/L (ref 38–126)
Anion gap: 10 (ref 5–15)
BUN: 28 mg/dL — ABNORMAL HIGH (ref 8–23)
CO2: 26 mmol/L (ref 22–32)
Calcium: 8.4 mg/dL — ABNORMAL LOW (ref 8.9–10.3)
Chloride: 106 mmol/L (ref 98–111)
Creatinine, Ser: 0.98 mg/dL (ref 0.44–1.00)
GFR, Estimated: >60 mL/min (ref >60)
Glucose, Bld: 166 mg/dL — ABNORMAL HIGH (ref 70–99)
Potassium: 4.2 mmol/L (ref 3.5–5.1)
Sodium: 142 mmol/L (ref 135–145)
Total Bilirubin: 0.4 mg/dL (ref 0.0–1.2)
Total Protein: 5.9 g/dL — ABNORMAL LOW (ref 6.5–8.1)

## 2024-09-06 LAB — CBC
HCT: 32.4 % — ABNORMAL LOW (ref 36.0–46.0)
Hemoglobin: 9.7 g/dL — ABNORMAL LOW (ref 12.0–15.0)
MCH: 28.7 pg (ref 26.0–34.0)
MCHC: 29.9 g/dL — ABNORMAL LOW (ref 30.0–36.0)
MCV: 95.9 fL (ref 80.0–100.0)
Platelets: 157 K/uL (ref 150–400)
RBC: 3.38 MIL/uL — ABNORMAL LOW (ref 3.87–5.11)
RDW: 12.2 % (ref 11.5–15.5)
WBC: 5.7 K/uL (ref 4.0–10.5)
nRBC: 0 % (ref 0.0–0.2)

## 2024-09-06 LAB — CBG MONITORING, ED
Glucose-Capillary: 241 mg/dL — ABNORMAL HIGH (ref 70–99)
Glucose-Capillary: 167 mg/dL — ABNORMAL HIGH (ref 70–99)

## 2024-09-06 LAB — GLUCOSE, CAPILLARY
Glucose-Capillary: 242 mg/dL — ABNORMAL HIGH (ref 70–99)
Glucose-Capillary: 269 mg/dL — ABNORMAL HIGH (ref 70–99)

## 2024-09-06 MED ORDER — FLUTICASONE PROPIONATE 50 MCG/ACT NA SUSP: 1.0000 | Freq: Every day | NASAL | Status: DC | PRN

## 2024-09-06 MED ORDER — FLEET ENEMA RE ENEM
1.0000 | ENEMA | Freq: Once | RECTAL | Status: AC
  Administered 2024-09-06: 1 via RECTAL
  Filled 2024-09-06: qty 1

## 2024-09-06 MED ORDER — ALBUTEROL SULFATE (2.5 MG/3ML) 0.083% IN NEBU: 3.0000 mL | INHALATION_SOLUTION | Freq: Four times a day (QID) | RESPIRATORY_TRACT | Status: DC | PRN

## 2024-09-06 MED ORDER — ACETAMINOPHEN 325 MG PO TABS: 650.0000 mg | ORAL_TABLET | Freq: Four times a day (QID) | ORAL | Status: DC | PRN

## 2024-09-06 MED ORDER — AMLODIPINE BESYLATE 2.5 MG PO TABS
2.5000 mg | ORAL_TABLET | Freq: Every day | ORAL | Status: DC
  Administered 2024-09-06 – 2024-09-07 (×2): 2.5 mg via ORAL
  Filled 2024-09-06 (×2): qty 1

## 2024-09-06 MED ORDER — LOSARTAN POTASSIUM 25 MG PO TABS
25.0000 mg | ORAL_TABLET | Freq: Every day | ORAL | Status: DC
  Administered 2024-09-06 – 2024-09-07 (×2): 25 mg via ORAL
  Filled 2024-09-06 (×2): qty 1

## 2024-09-06 MED ORDER — ACETAMINOPHEN 650 MG RE SUPP: 650.0000 mg | Freq: Four times a day (QID) | RECTAL | Status: DC | PRN

## 2024-09-06 NOTE — Care Management Obs Status (Signed)
 MEDICARE OBSERVATION STATUS NOTIFICATION   Patient Details  Name: [name] MRN: [id number] Date of Birth: [birth year]   Medicare Observation Status Notification Given:  Yes    Jon Cruel 09/06/2024, 2:01 PM

## 2024-09-06 NOTE — Progress Notes (Signed)
 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-has mild cognitive impairment CKD 3A Thyroid  cancer with thyroidectomy on current thyroxine Chronic thrombocytopenia since 10/2023 [facility] chronic respiratory failure 2 L at baseline Most recent hospitalization 11/2023 hypoglycemia in the setting of diabetes mellitus  9/12 right-sided facial numbness, visual disturbance right eye early morning-presented to ED 7:45 AM deficit remaining right-sided facial weakness hypoglycemic not taking her OmniPod for blood sugar control  Labs-sodium 144 potassium 3.7 CO2 26 BUN/creatinine 30/1.1 hemoglobin 10 WBC 5 platelet 145, BNP 133 EtOH less than 15 Imaging-CT head no acute intracranial abnormality stable noncontrast CT appearance with advanced chronic brainstem small vessel disease--MRI brain Motion degraded exam no evidence of acute intracranial abnormality parenchymal atrophy chronic small vessel ischemic disease and chronic lacunar infarcts chronic lacunar infarct in R MC peduncle is new from prior MRI CXR stable cardiomegaly and mild central pulmonary vascular congestion  Admitted for TIA/weakness   Assessment  & Plan :    TIA versus stroke (left pontine CVA 2022) Symptoms are mainly perioral numbness around right cheek without any droop-she does not think her speech is any different and is able to verbalize and articulate well She was given meclizine  for dizziness but no real symptoms now Note that PTA  atorvastatin  80, Eliquis  5 twice daily-no antiplatelet Will discuss with neurology scenario and see if we need to add aspirin -not sure if she needs any further workup as MRI not really showing any new findings Get therapy to see and if stabilizes in the next 24 hours may be able to discharge--see below Chronic ambulatory dysfunction She has not walked since maybe 2017 or 2019 and is wheelchair-bound Therapy for recommendations although this may not change a whole lot of what needs to be done for her P A-fib CHADVASC >4, complete heart block [employer] Given mildness of her symptoms not sure if we need to interrogate PPM--outpatient follow-up with EP Presumably not on rate control because of her complete heart block previously Permissive hypertension initially now controlling Start back losartan  25 amlodipine  2.5 Would resume Lasix  at a lower dose of may be 420 twice a day at discharge given mild azotemia on admission Chronic respiratory failure OSA OHS/on 2 L oxygen  at home Continue BiPAP at night Continue 2 L oxygen  at all times which is her baseline Albuterol  inhalers 2 puffs Q6 as needed, Flonase  1 spray daily as needed-holding Symbicort  and loratadine  for now Thyroid  cancer with thyroidectomy Continue levothyroxine  150 mcg daily Follow-up in the outpatient setting Chronic thrombocytopenia Needs outpatient characterization, obtain JAK2 with next labs platelet smear Uncontrolled diabetes mellitus hyper glycemic Has an OmniPod at home with 0 to 75 unit-- here will continue 10 of Lantus  moderate coverage with at bedtime and see how she does overnight Depression continue BuSpar  5 3 times daily, on gabapentin  3 times daily additionally? Mild AKI superimposed on CKD 3 AA Holding Lasix    Data Reviewed:   Sodium 142 potassium 4.2 BUN/creatinine 28/0.9 AST/ALT 35/46 WBC 5.7 hemoglobin 9.7  platelet 157  DVT prophylaxis: Eliquis   Status is: Observation The patient remains OBS appropriate  and will d/c before 2 midnights.     Current Dispo:   [facility] overnight likely can discharge in a.m.     Subjective:     I need an enema Otherwise doing well had some numbness to the face no weakness overall no chest pain no nausea no vomiting States that her lower extremities are weaker than normal but she thinks it may be positioning in the bed She is able to point her toes etc.   Objective + exam Vitals:   09/06/24 0300 09/06/24 0426 09/06/24 0600 09/06/24 0615  BP: (!) 120/52 (!) 124/49 (!) 135/48   Pulse: 60 73 (!) 59   Resp:  13  14  Temp:  97.7 F (36.5 C)    TempSrc:  Oral    SpO2: 100% 99% 100%    There were no vitals filed for this visit.   Examination: EOMI NCAT no focal deficit mild dysarthric speech with drooping of right side of face which seems chronic Is slightly more numb on the right side than the left Power 5/5 upper extremities little bit weak on lower extremities Sensory is intact lower extremities Vision by direct confrontation intact finger-nose-finger intact     Scheduled Meds:  apixaban   5 mg Oral BID   atorvastatin   80 mg Oral QPM   busPIRone   5 mg Oral TID   gabapentin   300 mg Oral TID   insulin  aspart  0-15 Units Subcutaneous TID WC   insulin  aspart  0-5 Units Subcutaneous QHS   insulin  glargine  10 Units Subcutaneous Daily   levothyroxine   150 mcg Oral QAC breakfast   pantoprazole   40 mg Oral BID   sodium chloride  flush  3 mL Intravenous Q12H   Continuous Infusions:  Time 60  Jai-Gurmukh Abubakar Crispo, MD  [facility]

## 2024-09-06 NOTE — ED Notes (Signed)
 Pt given PRN miralax  as requested.

## 2024-09-06 NOTE — ED Notes (Signed)
 RN and NT removed pt from bedpan. Pt did not pass any stool. Pt still has chucks and brief on

## 2024-09-06 NOTE — ED Notes (Signed)
RN setup pt breakfast tray

## 2024-09-06 NOTE — ED Notes (Signed)
 The pt  had to be awakened to do her nih  I think her responses are due to her being so sleepy  she could not keep her eyes open I had to keep reminding her to open her eyes

## 2024-09-07 DIAGNOSIS — M549 Dorsalgia, unspecified: Secondary | ICD-10-CM | POA: Diagnosis not present

## 2024-09-07 DIAGNOSIS — R6889 Other general symptoms and signs: Secondary | ICD-10-CM | POA: Diagnosis not present

## 2024-09-07 DIAGNOSIS — Z743 Need for continuous supervision: Secondary | ICD-10-CM | POA: Diagnosis not present

## 2024-09-07 DIAGNOSIS — R29818 Other symptoms and signs involving the nervous system: Secondary | ICD-10-CM | POA: Diagnosis not present

## 2024-09-07 DIAGNOSIS — I499 Cardiac arrhythmia, unspecified: Secondary | ICD-10-CM | POA: Diagnosis not present

## 2024-09-07 DIAGNOSIS — R079 Chest pain, unspecified: Secondary | ICD-10-CM | POA: Diagnosis not present

## 2024-09-07 LAB — CBC WITH DIFFERENTIAL/PLATELET
Abs Immature Granulocytes: 0.01 K/uL (ref 0.00–0.07)
Basophils Absolute: 0 K/uL (ref 0.0–0.1)
Basophils Relative: 1 %
Eosinophils Absolute: 0.1 K/uL (ref 0.0–0.5)
Eosinophils Relative: 3 %
HCT: 34 % — ABNORMAL LOW (ref 36.0–46.0)
Hemoglobin: 10.4 g/dL — ABNORMAL LOW (ref 12.0–15.0)
Immature Granulocytes: 0 %
Lymphocytes Relative: 29 %
Lymphs Abs: 1.5 K/uL (ref 0.7–4.0)
MCH: 29.2 pg (ref 26.0–34.0)
MCHC: 30.6 g/dL (ref 30.0–36.0)
MCV: 95.5 fL (ref 80.0–100.0)
Monocytes Absolute: 0.8 K/uL (ref 0.1–1.0)
Monocytes Relative: 15 %
Neutro Abs: 2.8 K/uL (ref 1.7–7.7)
Neutrophils Relative %: 52 %
Platelets: 166 K/uL (ref 150–400)
RBC: 3.56 MIL/uL — ABNORMAL LOW (ref 3.87–5.11)
RDW: 12.1 % (ref 11.5–15.5)
WBC: 5.3 K/uL (ref 4.0–10.5)
nRBC: 0 % (ref 0.0–0.2)

## 2024-09-07 LAB — BASIC METABOLIC PANEL WITH GFR
Anion gap: 9 (ref 5–15)
BUN: 23 mg/dL (ref 8–23)
CO2: 28 mmol/L (ref 22–32)
Calcium: 8.7 mg/dL — ABNORMAL LOW (ref 8.9–10.3)
Chloride: 104 mmol/L (ref 98–111)
Creatinine, Ser: 0.98 mg/dL (ref 0.44–1.00)
GFR, Estimated: >60 mL/min (ref >60)
Glucose, Bld: 201 mg/dL — ABNORMAL HIGH (ref 70–99)
Potassium: 4.1 mmol/L (ref 3.5–5.1)
Sodium: 141 mmol/L (ref 135–145)

## 2024-09-07 LAB — GLUCOSE, CAPILLARY
Glucose-Capillary: 194 mg/dL — ABNORMAL HIGH (ref 70–99)
Glucose-Capillary: 240 mg/dL — ABNORMAL HIGH (ref 70–99)
Glucose-Capillary: 227 mg/dL — ABNORMAL HIGH (ref 70–99)

## 2024-09-07 MED ORDER — ACETAMINOPHEN ER 650 MG PO TBCR: 650.0000 mg | EXTENDED_RELEASE_TABLET | Freq: Three times a day (TID) | ORAL | Status: DC | PRN

## 2024-09-07 MED ORDER — GABAPENTIN 300 MG PO CAPS: 300.0000 mg | ORAL_CAPSULE | Freq: Three times a day (TID) | ORAL | Status: AC

## 2024-09-07 MED ORDER — FUROSEMIDE 40 MG PO TABS
40.0000 mg | ORAL_TABLET | Freq: Two times a day (BID) | ORAL | Status: DC
  Administered 2024-09-07 (×2): 40 mg via ORAL
  Filled 2024-09-07 (×2): qty 1

## 2024-09-07 NOTE — Discharge Summary (Signed)
 Physician Discharge Summary  [name] FMW:[id number] DOB: 03/28/1951 DOA: 09/05/2024  PCP: Leonel Cole, MD  Admit date: 09/05/2024 Discharge date: 09/07/2024  Time spent: 40 minutes  Recommendations for Outpatient Follow-up:  Requires outpatient Chem-12 CBC [facility] in about 1 month Resume oxygen  etc. and consider outpatient sleep apnea reeval and titration  Discharge Diagnoses:  MAIN problem for hospitalization   TIA  Please see below for itemized issues addressed in HOpsital- refer to other progress notes for clarity if needed  Discharge Condition: Guarded overall  Diet recommendation: Diabetic heart healthy  There were no vitals filed for this visit.  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-has mild cognitive impairment CKD 3A Thyroid  cancer with thyroidectomy on current thyroxine Chronic thrombocytopenia since 10/2023 [facility] chronic respiratory failure 2 L at baseline Most recent hospitalization 11/2023 hypoglycemia in the setting of diabetes mellitus   9/12 right-sided facial numbness, visual disturbance right eye early morning-presented to ED 7:45 AM deficit remaining right-sided facial weakness hypoglycemic not taking her OmniPod for blood sugar control   Labs-sodium 144 potassium 3.7 CO2 26 BUN/creatinine 30/1.1 hemoglobin 10 WBC 5 platelet 145, BNP 133 EtOH less than 15 Imaging-CT head no acute intracranial abnormality stable noncontrast CT appearance with advanced chronic brainstem small vessel disease--MRI brain Motion degraded exam no evidence of acute intracranial abnormality parenchymal atrophy chronic small  vessel ischemic disease and chronic lacunar infarcts chronic lacunar infarct in R MC peduncle is new from prior MRI CXR stable cardiomegaly and mild central pulmonary vascular congestion   Admitted for TIA/weakness    Assessment  & Plan :      TIA versus stroke (left pontine CVA 2022) Symptoms are mainly perioral numbness around right cheek without any droop-she does not think her speech is any different and is able to verbalize and articulate well She was given meclizine  for dizziness but no real symptoms now Note that PTA atorvastatin  80, Eliquis  5 twice daily-discussed with neurology--- no changes in meds/therapy needs as she is baseline bedbound Stable for discharge on current meds Chronic ambulatory dysfunction She has not walked since maybe 2017 or 2019 and is wheelchair-bound Op- follow-up with primary care P A-fib CHADVASC >4, complete heart block Medtronic Given mildness of her symptoms PPM was not interrogated and on monitors she did not have any arrhythmia Presumably not on rate control because of her complete heart block previously Permissive hypertension initially now controlling Start back losartan  25 amlodipine  2.5 Resume Lasix  at PTA dosing at discharge Chronic respiratory failure OSA OHS/on 2 L oxygen  at home Continue BiPAP at night Continue 2 L oxygen  at all times which is her baseline Albuterol  inhalers 2 puffs Q6 as needed, Flonase  1 spray daily as needed-holding Symbicort  and loratadine  for now Thyroid  cancer with thyroidectomy Continue levothyroxine  150 mcg daily Follow-up in the outpatient setting Chronic thrombocytopenia Needs outpatient characterization, obtain JAK2 with next labs platelet smear Uncontrolled diabetes mellitus hyper glycemic Has an OmniPod at home with 0 to 75 unit--we stopped the long-acting insulin  at discharge her sugars were moderately controlled during brief hospitalization and we took her off the OmniPod She will resume the OmniPod at  home Depression continue BuSpar  5 3 times daily, on gabapentin   3 times daily additionally Mild AKI superimposed on CKD 3 AA Lasix  held initially resumed at discharge outpatient labs needed in 1 week  Discharge Exam: Vitals:   09/07/24 0438 09/07/24 0806  BP: (!) 156/55 (!) 169/52  Pulse: 61 (!) 53  Resp:  15  Temp: 97.9 F (36.6 C) 98 F (36.7 C)  SpO2: 100% 97%    Subj on day of d/c   Coherent awake alert no distress looks comfortable ate a full meal Still some perioral numbness   General Exam on discharge  EOMI NCAT no icterus no pallor finger-nose-finger intact with slight hands above head Chest is clear Abdomen is obese nontender no rebound No lower extremity edema She is weak bilaterally in lower extremities but 5/5 power grossly  Discharge Instructions   Discharge Instructions     Diet - low sodium heart healthy   Complete by: As directed    Discharge instructions   Complete by: As directed    You likely had a very small stroke affecting your cheek with some numbness and it does not require any additional medication or any changes other than taking your meds as per usual You will need follow-up with your primary physician in about a week with labs and adjustment of meds based on those labs (your diuretics if needed) We will get ambulance transport for you Best of luck   Increase activity slowly   Complete by: As directed    No wound care   Complete by: As directed       Allergies as of 09/07/2024       Reactions   Latex Rash        Medication List     STOP taking these medications    Tresiba FlexTouch 100 UNIT/ML FlexTouch Pen Generic drug: insulin  degludec       TAKE these medications    acetaminophen  650 MG CR tablet Commonly known as: TYLENOL  Take 650 mg by mouth every 8 (eight) hours as needed for pain.   albuterol  108 (90 Base) MCG/ACT inhaler Commonly known as: VENTOLIN  HFA Inhale 2 puffs into the lungs every 6 (six) hours as  needed for wheezing or shortness of breath.   amLODipine  2.5 MG tablet Commonly known as: NORVASC  Take 2.5 mg by mouth daily.   apixaban  5 MG Tabs tablet Commonly known as: Eliquis  Take 1 tablet (5 mg total) by mouth 2 (two) times daily.   atorvastatin  80 MG tablet Commonly known as: LIPITOR  TAKE 1 TABLET(80 MG) BY MOUTH EVERY EVENING   budesonide -formoterol  80-4.5 MCG/ACT inhaler Commonly known as: Symbicort  Inhale 2 puffs into the lungs 2 (two) times daily.   busPIRone  5 MG tablet Commonly known as: BUSPAR  TAKE 1 TABLET(5 MG) BY MOUTH THREE TIMES DAILY   fluticasone  50 MCG/ACT nasal spray Commonly known as: FLONASE  Place 1 spray into both nostrils daily as needed for allergies.   furosemide  40 MG tablet Commonly known as: LASIX  Take 1 tablet (40 mg total) by mouth 2 (two) times daily.   gabapentin  300 MG capsule Commonly known as: NEURONTIN  Take 1 capsule (300 mg total) by mouth 3 (three) times daily.   HumaLOG 100 UNIT/ML injection Generic drug: insulin  lispro Inject 0-75 Units into the skin See admin instructions. Load 200 units into Omnipod every 2 days.   levothyroxine  150 MCG tablet Commonly known as: SYNTHROID  Take 1 tablet (150 mcg total) by mouth daily before breakfast.   loratadine  10 MG tablet Commonly known as: CLARITIN  Take 10 mg by  mouth daily.   losartan  25 MG tablet Commonly known as: COZAAR  TAKE 1 TABLET(25 MG) BY MOUTH DAILY   OXYGEN  Inhale 2 L into the lungs continuous.   pantoprazole  40 MG tablet Commonly known as: PROTONIX  Take 1 tablet (40 mg total) by mouth 2 (two) times daily.   polyethylene glycol powder 17 GM/SCOOP powder Commonly known as: GLYCOLAX /MIRALAX  Take 17 g by mouth 2 (two) times daily as needed for moderate constipation. What changed: when to take this               Durable Medical Equipment  (From admission, onward)           Start     Ordered   09/07/24 0930  DME Oxygen   Once       Question Answer  Comment  Length of Need Lifetime   Mode or (Route) Nasal cannula   Liters per Minute 2   Frequency Continuous (stationary and portable oxygen  unit needed)   Oxygen  conserving device No   Oxygen  delivery system Gas      09/07/24 0930           Allergies  Allergen Reactions   Latex Rash      The results of significant diagnostics from this hospitalization (including imaging, microbiology, ancillary and laboratory) are listed below for reference.    Significant Diagnostic Studies: MR BRAIN WO CONTRAST Result Date: 09/05/2024 CLINICAL DATA:  Provided history: Neuro deficit, acute, stroke suspected. EXAM: MRI HEAD WITHOUT CONTRAST TECHNIQUE: Multiplanar, multiecho pulse sequences of the brain and surrounding structures were obtained without intravenous contrast. COMPARISON:  Head CT 09/05/2024. Brain MRI 08/05/2021. FINDINGS: Intermittently motion degraded examination. Most notably, the axial diffusion-weighted sequences are severely motion degraded. Within this limitation, findings are as follows. Brain: Generalized cerebral atrophy. Advanced chronic small vessel ischemic disease within the pons and bilateral middle cerebellar peduncles with chronic lacunar infarcts at these sites. A chronic lacunar infarct within the right middle cerebral peduncle is new as compared to the prior MRI of 08/05/2021. Multifocal T2 FLAIR hyperintense signal abnormality within the cerebral white matter, nonspecific but compatible with mild chronic small vessel ischemic disease. No acute infarct identified. No evidence of an intracranial mass. No extra-axial fluid collection. No midline shift. Vascular: Maintained flow voids within the proximal large arterial vessels. Skull and upper cervical spine: No focal worrisome marrow lesion. Sinuses/Orbits: No mass or acute finding within the imaged orbits. Prior bilateral ocular lens replacement. No significant paranasal sinus disease. IMPRESSION: 1. Motion degraded  examination as described. Within this limitation, findings are as follows. 2. No evidence of an acute intracranial abnormality. 3. Parenchymal atrophy, chronic small vessel ischemic disease and chronic lacunar infarcts, as described. Of note, a chronic lacunar infarct within the right middle cerebellar peduncle which is new from the prior MRI of 08/05/2021. Electronically Signed   By: Rockey Childs D.O.   On: 09/05/2024 16:30   DG Chest Portable 1 View Result Date: 09/05/2024 CLINICAL DATA:  Shortness of breath. EXAM: PORTABLE CHEST 1 VIEW COMPARISON:  December 10, 2023. FINDINGS: Stable cardiomegaly with mild central pulmonary vascular congestion. Left-sided pacemaker is unchanged in position. Bony thorax is unremarkable. IMPRESSION: Stable cardiomegaly with mild central pulmonary vascular congestion. Electronically Signed   By: Lynwood Landy Raddle M.D.   On: 09/05/2024 10:09   CT HEAD WO CONTRAST Result Date: 09/05/2024 CLINICAL DATA:  73 year old female with right eye vision changes, right facial numbness. Symptoms discovered at 0300 hours. EXAM: CT HEAD WITHOUT CONTRAST TECHNIQUE: Contiguous  axial images were obtained from the base of the skull through the vertex without intravenous contrast. RADIATION DOSE REDUCTION: This exam was performed according to the departmental dose-optimization program which includes automated exposure control, adjustment of the mA and/or kV according to patient size and/or use of iterative reconstruction technique. COMPARISON:  Brain MRI 08/05/2021.  Head CT 12/10/2023. FINDINGS: Brain: Stable cerebral volume from last year, normal for age. Chronic basal ganglia vascular calcifications. No midline shift, ventriculomegaly, mass effect, evidence of mass lesion, intracranial hemorrhage or evidence of cortically based acute infarction. Extensive chronic brainstem lacunar infarcts, heterogeneity appears stable. Otherwise gray-white differentiation stable and within normal limits for age.  ASPECTS 10. Vascular: Extensive Calcified atherosclerosis at the skull base. No suspicious intracranial vascular hyperdensity. Skull: Congenital incomplete ossification of the posterior C1 ring, normal variant. Calvarium intact. No acute osseous abnormality identified. Sinuses/Orbits: Visualized paranasal sinuses and mastoids are stable and well aerated. Other: No gaze deviation. Chronic postoperative changes to the orbits appear stable. Visualized scalp soft tissues are within normal limits. IMPRESSION: No acute intracranial abnormality. Stable non contrast CT appearance of advanced chronic brainstem small vessel disease. Electronically Signed   By: VEAR Hurst M.D.   On: 09/05/2024 09:33    Microbiology: No results found for this or any previous visit (from the past 240 hours).   Labs: Basic Metabolic Panel: Recent Labs  Lab 09/05/24 0753 09/05/24 0834 09/05/24 0847 09/05/24 0951 09/06/24 0200 09/07/24 0634  NA 144 144 142 144 142 141  K 3.8 3.7 4.1 4.1 4.2 4.1  CL 106 108  --   --  106 104  CO2 26  --   --   --  26 28  GLUCOSE 102* 99  --   --  166* 201*  BUN 26* 30*  --   --  28* 23  CREATININE 0.97 1.10*  --   --  0.98 0.98  CALCIUM  8.4*  --   --   --  8.4* 8.7*   Liver Function Tests: Recent Labs  Lab 09/05/24 0753 09/06/24 0200  AST 57* 35  ALT 60* 46*  ALKPHOS 67 59  BILITOT 0.6 0.4  PROT 6.6 5.9*  ALBUMIN 3.0* 2.6*   No results for input(s): LIPASE, AMYLASE in the last 168 hours. No results for input(s): AMMONIA in the last 168 hours. CBC: Recent Labs  Lab 09/05/24 0753 09/05/24 0834 09/05/24 0847 09/05/24 0951 09/06/24 0200 09/07/24 0634  WBC 5.0  --   --   --  5.7 5.3  NEUTROABS 2.4  --   --   --   --  2.8  HGB 10.0* 10.2* 10.2* 10.5* 9.7* 10.4*  HCT 32.4* 30.0* 30.0* 31.0* 32.4* 34.0*  MCV 97.6  --   --   --  95.9 95.5  PLT 145*  --   --   --  157 166   Cardiac Enzymes: No results for input(s): CKTOTAL, CKMB, CKMBINDEX, TROPONINI in the  last 168 hours. BNP: BNP (last 3 results) Recent Labs    11/17/23 1103 12/10/23 1307 09/05/24 0808  BNP 212.5* 146.6* 133.5*    ProBNP (last 3 results) No results for input(s): PROBNP in the last 8760 hours.  CBG: Recent Labs  Lab 09/06/24 0757 09/06/24 1213 09/06/24 1556 09/06/24 2157 09/07/24 0624  GLUCAP 241* 167* 242* 269* 194*    Signed:  Colen Grimes MD   [facility] 09/07/2024, 9:30 AM

## 2024-09-07 NOTE — Plan of Care (Signed)
  Problem: Education: Goal: Ability to describe self-care measures that may prevent or decrease complications (Diabetes Survival Skills Education) will improve Outcome: Progressing Goal: Individualized Educational Video(s) Outcome: Progressing   Problem: Education: Goal: Ability to describe self-care measures that may prevent or decrease complications (Diabetes Survival Skills Education) will improve Outcome: Progressing

## 2024-09-08 ENCOUNTER — Encounter (HOSPITAL_BASED_OUTPATIENT_CLINIC_OR_DEPARTMENT_OTHER): Admitting: General Surgery

## 2024-09-09 ENCOUNTER — Encounter: Payer: Self-pay | Admitting: Internal Medicine

## 2024-09-09 DIAGNOSIS — I110 Hypertensive heart disease with heart failure: Secondary | ICD-10-CM | POA: Diagnosis not present

## 2024-09-09 DIAGNOSIS — M545 Low back pain, unspecified: Secondary | ICD-10-CM | POA: Diagnosis not present

## 2024-09-09 DIAGNOSIS — M1990 Unspecified osteoarthritis, unspecified site: Secondary | ICD-10-CM | POA: Diagnosis not present

## 2024-09-09 DIAGNOSIS — G8929 Other chronic pain: Secondary | ICD-10-CM | POA: Diagnosis not present

## 2024-09-09 DIAGNOSIS — I442 Atrioventricular block, complete: Secondary | ICD-10-CM | POA: Diagnosis not present

## 2024-09-09 DIAGNOSIS — L89622 Pressure ulcer of left heel, stage 2: Secondary | ICD-10-CM | POA: Diagnosis not present

## 2024-09-09 DIAGNOSIS — E890 Postprocedural hypothyroidism: Secondary | ICD-10-CM | POA: Diagnosis not present

## 2024-09-09 DIAGNOSIS — I5032 Chronic diastolic (congestive) heart failure: Secondary | ICD-10-CM | POA: Diagnosis not present

## 2024-09-09 DIAGNOSIS — K649 Unspecified hemorrhoids: Secondary | ICD-10-CM | POA: Diagnosis not present

## 2024-09-09 DIAGNOSIS — H8110 Benign paroxysmal vertigo, unspecified ear: Secondary | ICD-10-CM | POA: Diagnosis not present

## 2024-09-09 DIAGNOSIS — I480 Paroxysmal atrial fibrillation: Secondary | ICD-10-CM | POA: Diagnosis not present

## 2024-09-09 DIAGNOSIS — J9621 Acute and chronic respiratory failure with hypoxia: Secondary | ICD-10-CM | POA: Diagnosis not present

## 2024-09-09 DIAGNOSIS — E7800 Pure hypercholesterolemia, unspecified: Secondary | ICD-10-CM | POA: Diagnosis not present

## 2024-09-09 DIAGNOSIS — E10621 Type 1 diabetes mellitus with foot ulcer: Secondary | ICD-10-CM | POA: Diagnosis not present

## 2024-09-09 DIAGNOSIS — K219 Gastro-esophageal reflux disease without esophagitis: Secondary | ICD-10-CM | POA: Diagnosis not present

## 2024-09-09 DIAGNOSIS — I272 Pulmonary hypertension, unspecified: Secondary | ICD-10-CM | POA: Diagnosis not present

## 2024-09-09 DIAGNOSIS — G3184 Mild cognitive impairment, so stated: Secondary | ICD-10-CM | POA: Diagnosis not present

## 2024-09-09 DIAGNOSIS — I69354 Hemiplegia and hemiparesis following cerebral infarction affecting left non-dominant side: Secondary | ICD-10-CM | POA: Diagnosis not present

## 2024-09-09 DIAGNOSIS — K5900 Constipation, unspecified: Secondary | ICD-10-CM | POA: Diagnosis not present

## 2024-09-09 DIAGNOSIS — D649 Anemia, unspecified: Secondary | ICD-10-CM | POA: Diagnosis not present

## 2024-09-16 DIAGNOSIS — E7800 Pure hypercholesterolemia, unspecified: Secondary | ICD-10-CM | POA: Diagnosis not present

## 2024-09-16 DIAGNOSIS — I110 Hypertensive heart disease with heart failure: Secondary | ICD-10-CM | POA: Diagnosis not present

## 2024-09-16 DIAGNOSIS — I442 Atrioventricular block, complete: Secondary | ICD-10-CM | POA: Diagnosis not present

## 2024-09-16 DIAGNOSIS — I5032 Chronic diastolic (congestive) heart failure: Secondary | ICD-10-CM | POA: Diagnosis not present

## 2024-09-16 DIAGNOSIS — J9621 Acute and chronic respiratory failure with hypoxia: Secondary | ICD-10-CM | POA: Diagnosis not present

## 2024-09-16 DIAGNOSIS — I272 Pulmonary hypertension, unspecified: Secondary | ICD-10-CM | POA: Diagnosis not present

## 2024-09-16 DIAGNOSIS — G8929 Other chronic pain: Secondary | ICD-10-CM | POA: Diagnosis not present

## 2024-09-16 DIAGNOSIS — K649 Unspecified hemorrhoids: Secondary | ICD-10-CM | POA: Diagnosis not present

## 2024-09-16 DIAGNOSIS — G3184 Mild cognitive impairment, so stated: Secondary | ICD-10-CM | POA: Diagnosis not present

## 2024-09-16 DIAGNOSIS — H8110 Benign paroxysmal vertigo, unspecified ear: Secondary | ICD-10-CM | POA: Diagnosis not present

## 2024-09-16 DIAGNOSIS — K219 Gastro-esophageal reflux disease without esophagitis: Secondary | ICD-10-CM | POA: Diagnosis not present

## 2024-09-16 DIAGNOSIS — I480 Paroxysmal atrial fibrillation: Secondary | ICD-10-CM | POA: Diagnosis not present

## 2024-09-16 DIAGNOSIS — M1990 Unspecified osteoarthritis, unspecified site: Secondary | ICD-10-CM | POA: Diagnosis not present

## 2024-09-16 DIAGNOSIS — E10621 Type 1 diabetes mellitus with foot ulcer: Secondary | ICD-10-CM | POA: Diagnosis not present

## 2024-09-16 DIAGNOSIS — E890 Postprocedural hypothyroidism: Secondary | ICD-10-CM | POA: Diagnosis not present

## 2024-09-16 DIAGNOSIS — I69354 Hemiplegia and hemiparesis following cerebral infarction affecting left non-dominant side: Secondary | ICD-10-CM | POA: Diagnosis not present

## 2024-09-16 DIAGNOSIS — D649 Anemia, unspecified: Secondary | ICD-10-CM | POA: Diagnosis not present

## 2024-09-16 DIAGNOSIS — M545 Low back pain, unspecified: Secondary | ICD-10-CM | POA: Diagnosis not present

## 2024-09-16 DIAGNOSIS — L89622 Pressure ulcer of left heel, stage 2: Secondary | ICD-10-CM | POA: Diagnosis not present

## 2024-09-16 DIAGNOSIS — K5900 Constipation, unspecified: Secondary | ICD-10-CM | POA: Diagnosis not present

## 2024-09-17 DIAGNOSIS — G459 Transient cerebral ischemic attack, unspecified: Secondary | ICD-10-CM | POA: Diagnosis not present

## 2024-09-17 DIAGNOSIS — Z8673 Personal history of transient ischemic attack (TIA), and cerebral infarction without residual deficits: Secondary | ICD-10-CM | POA: Diagnosis not present

## 2024-09-22 DIAGNOSIS — I5032 Chronic diastolic (congestive) heart failure: Secondary | ICD-10-CM | POA: Diagnosis not present

## 2024-09-22 DIAGNOSIS — I110 Hypertensive heart disease with heart failure: Secondary | ICD-10-CM | POA: Diagnosis not present

## 2024-09-22 DIAGNOSIS — J9621 Acute and chronic respiratory failure with hypoxia: Secondary | ICD-10-CM | POA: Diagnosis not present

## 2024-09-22 DIAGNOSIS — E10621 Type 1 diabetes mellitus with foot ulcer: Secondary | ICD-10-CM | POA: Diagnosis not present

## 2024-09-22 DIAGNOSIS — K649 Unspecified hemorrhoids: Secondary | ICD-10-CM | POA: Diagnosis not present

## 2024-09-22 DIAGNOSIS — I442 Atrioventricular block, complete: Secondary | ICD-10-CM | POA: Diagnosis not present

## 2024-09-22 DIAGNOSIS — E890 Postprocedural hypothyroidism: Secondary | ICD-10-CM | POA: Diagnosis not present

## 2024-09-22 DIAGNOSIS — L89622 Pressure ulcer of left heel, stage 2: Secondary | ICD-10-CM | POA: Diagnosis not present

## 2024-09-22 DIAGNOSIS — E7800 Pure hypercholesterolemia, unspecified: Secondary | ICD-10-CM | POA: Diagnosis not present

## 2024-09-22 DIAGNOSIS — I69354 Hemiplegia and hemiparesis following cerebral infarction affecting left non-dominant side: Secondary | ICD-10-CM | POA: Diagnosis not present

## 2024-09-22 DIAGNOSIS — D649 Anemia, unspecified: Secondary | ICD-10-CM | POA: Diagnosis not present

## 2024-09-22 DIAGNOSIS — M1990 Unspecified osteoarthritis, unspecified site: Secondary | ICD-10-CM | POA: Diagnosis not present

## 2024-09-22 DIAGNOSIS — K219 Gastro-esophageal reflux disease without esophagitis: Secondary | ICD-10-CM | POA: Diagnosis not present

## 2024-09-22 DIAGNOSIS — H8110 Benign paroxysmal vertigo, unspecified ear: Secondary | ICD-10-CM | POA: Diagnosis not present

## 2024-09-22 DIAGNOSIS — I272 Pulmonary hypertension, unspecified: Secondary | ICD-10-CM | POA: Diagnosis not present

## 2024-09-22 DIAGNOSIS — G8929 Other chronic pain: Secondary | ICD-10-CM | POA: Diagnosis not present

## 2024-09-22 DIAGNOSIS — M545 Low back pain, unspecified: Secondary | ICD-10-CM | POA: Diagnosis not present

## 2024-09-22 DIAGNOSIS — K5900 Constipation, unspecified: Secondary | ICD-10-CM | POA: Diagnosis not present

## 2024-09-22 DIAGNOSIS — I480 Paroxysmal atrial fibrillation: Secondary | ICD-10-CM | POA: Diagnosis not present

## 2024-09-22 DIAGNOSIS — G3184 Mild cognitive impairment, so stated: Secondary | ICD-10-CM | POA: Diagnosis not present

## 2024-09-23 ENCOUNTER — Ambulatory Visit: Payer: Self-pay | Admitting: Pulmonary Disease

## 2024-09-23 DIAGNOSIS — I639 Cerebral infarction, unspecified: Secondary | ICD-10-CM | POA: Diagnosis not present

## 2024-09-23 DIAGNOSIS — E7800 Pure hypercholesterolemia, unspecified: Secondary | ICD-10-CM | POA: Diagnosis not present

## 2024-09-23 DIAGNOSIS — C73 Malignant neoplasm of thyroid gland: Secondary | ICD-10-CM | POA: Diagnosis not present

## 2024-09-23 DIAGNOSIS — E11319 Type 2 diabetes mellitus with unspecified diabetic retinopathy without macular edema: Secondary | ICD-10-CM | POA: Diagnosis not present

## 2024-09-23 DIAGNOSIS — E109 Type 1 diabetes mellitus without complications: Secondary | ICD-10-CM | POA: Diagnosis not present

## 2024-09-23 DIAGNOSIS — I10 Essential (primary) hypertension: Secondary | ICD-10-CM | POA: Diagnosis not present

## 2024-09-23 DIAGNOSIS — E890 Postprocedural hypothyroidism: Secondary | ICD-10-CM | POA: Diagnosis not present

## 2024-09-23 DIAGNOSIS — Z794 Long term (current) use of insulin: Secondary | ICD-10-CM | POA: Diagnosis not present

## 2024-09-23 DIAGNOSIS — L97909 Non-pressure chronic ulcer of unspecified part of unspecified lower leg with unspecified severity: Secondary | ICD-10-CM | POA: Diagnosis not present

## 2024-09-23 NOTE — Telephone Encounter (Signed)
 FYI Only or Action Required?: Action required by provider: clinical question for provider.  Patient is followed in Pulmonology for chronic respiratory failure, last seen on 02/18/2024 by Hunsucker, Donnice SAUNDERS, MD.  Called Nurse Triage reporting Shortness of Breath.  Symptoms began several months ago.  Interventions attempted: Rescue inhaler and Maintenance inhaler.  Symptoms are: unchanged.  Triage Disposition: See PCP When Office is Open (Within 3 Days)  Patient/caregiver understands and will follow disposition?: No, wishes to speak with PCP       Copied from CRM #[id number]. Topic: Clinical - Red Word Triage >> Sep 23, 2024 11:19 AM Devaughn RAMAN wrote: Red Word that prompted transfer to Nurse Triage: shortness of breath       Reason for Disposition  [1] MODERATE longstanding difficulty breathing (e.g., speaks in phrases, SOB even at rest, pulse 100-120) AND [2] SAME as normal  Answer Assessment - Initial Assessment Questions Patient states she is unable to come in for an appointment due to transportation. Patient is requesting an alternative to her Symbicort  due to the cost, and wanted a rescue inhaler that is stronger than her Albuterol  inhaler, stating I don't want to have to be on my oxygen  all the time. Patient advised to keep her oxygen  on as instructed and to call back for any new or worsening symptoms. She is agreeable with this plan.         1. RESPIRATORY STATUS: Describe your breathing? (e.g., wheezing, shortness of breath, unable to speak, severe coughing)      Shortness of breath  2. ONSET: When did this breathing problem begin?      3 months  3. PATTERN Does the difficult breathing come and go, or has it been constant since it started?      Constant  4. SEVERITY: How bad is your breathing? (e.g., mild, moderate, severe)      Mild to moderate  5. RECURRENT SYMPTOM: Have you had difficulty breathing before? If Yes, ask: When was the last time? and  What happened that time?      Yes 6. CARDIAC HISTORY: Do you have any history of heart disease? (e.g., heart attack, angina, bypass surgery, angioplasty)      Yes 7. LUNG HISTORY: Do you have any history of lung disease?  (e.g., pulmonary embolus, asthma, emphysema)     Yes 8. CAUSE: What do you think is causing the breathing problem?      History of chronic respiratory failure  9. OTHER SYMPTOMS: Do you have any other symptoms? (e.g., chest pain, cough, dizziness, fever, runny nose)     No 10. O2 SATURATION MONITOR:  Do you use an oxygen  saturation monitor (pulse oximeter) at home? If Yes, ask: What is your reading (oxygen  level) today? What is your usual oxygen  saturation reading? (e.g., 95%)       Does not check at home  Protocols used: Breathing Difficulty-A-AH

## 2024-09-25 ENCOUNTER — Telehealth: Payer: Self-pay

## 2024-09-25 ENCOUNTER — Other Ambulatory Visit: Payer: Self-pay

## 2024-09-25 ENCOUNTER — Ambulatory Visit: Payer: Self-pay

## 2024-09-25 ENCOUNTER — Telehealth: Payer: Self-pay | Admitting: Pulmonary Disease

## 2024-09-25 MED ORDER — BUDESONIDE-FORMOTEROL FUMARATE 80-4.5 MCG/ACT IN AERO: 2.0000 | INHALATION_SPRAY | Freq: Two times a day (BID) | RESPIRATORY_TRACT | 5 refills | Status: DC

## 2024-09-25 NOTE — Telephone Encounter (Signed)
 I called and spoke with the pt and offered appt for this afternoon. She is unable to come in until next Tues, so I have scheduled her for then. She is aware to seek emergent care if her symptoms worsen.

## 2024-09-25 NOTE — Telephone Encounter (Signed)
 Needs OV or go to urgent care or ER for evaluation.  JD

## 2024-09-25 NOTE — Telephone Encounter (Signed)
 Please advise any alternatives or recommendations.

## 2024-09-25 NOTE — Telephone Encounter (Signed)
 Copied from CRM #[id number]. Topic: Clinical - Medication Question >> Sep 23, 2024  4:20 PM Devaughn RAMAN wrote: Reason for CRM: Patient is calling to follow up regarding the rescue inhaler and maintenance inhaler. Patient would like to know if she will be getting the medication per the Nurse Triage note.  Pt states she doesn't want Symbicort  as it is way to expensive at $47. Pt states she would like something cheaper. Pt states she does still have her Albuterol  which she does use. Please advise.

## 2024-09-25 NOTE — Telephone Encounter (Signed)
 Recommend she be scheduled for acute visit to evaluate her shortness of breath.  Dorn Chill, MD [facility] Office: [phone]   See Amion for personal pager PCCM on call pager [phone] until 7pm. Please call Elink 7p-7a. [phone]

## 2024-09-25 NOTE — Telephone Encounter (Signed)
 Attempted to call patient to schedule Acute appointment for shortness of breath. Left patient voicemail to call our office to get scheduled. Will send MyChart message

## 2024-09-25 NOTE — Telephone Encounter (Signed)
 Complaining of sob that started a week ago.  Using Albuterol  and Symbicort  inhalers. Feels like she needs an inhaler she can use more than every 6 hours as rescue inhaler. Please advise.  Thank you.

## 2024-09-25 NOTE — Telephone Encounter (Signed)
 FYI Only or Action Required?: Action required by provider: clinical question for provider.  Patient was last seen in primary care on 07/11/2022 by Tobie Lola PARAS, MD.  Called Nurse Triage reporting Shortness of Breath.  Symptoms began a week ago.  Interventions attempted: Prescription medications: using inhalers.  Symptoms are: gradually worsening. Pt. Called 09/23/24, has not heard back. Using Albuterol  and Symbicort  inhalers. Feels like she needs an inhaler she can use more than every 6 hours as rescue inhaler. Please advise pt.  Triage Disposition: Call PCP Now  Patient/caregiver understands and will follow disposition?: Yes     Copied from CRM [id number]. Topic: Clinical - Red Word Triage >> Sep 25, 2024 10:26 AM [name] wrote: Red Word that prompted transfer to Nurse Triage: shortness of breath and states heart/chest hurts. Reason for Disposition  Oxygen  level (e.g., pulse oximetry) 91 to 94%  Answer Assessment - Initial Assessment Questions 1. RESPIRATORY STATUS: Describe your breathing? (e.g., wheezing, shortness of breath, unable to speak, severe coughing)      SOB, chest tightness 2. ONSET: When did this breathing problem begin?      2 weeks 3. PATTERN Does the difficult breathing come and go, or has it been constant since it started?      Comes and goes 4. SEVERITY: How bad is your breathing? (e.g., mild, moderate, severe)      mild 5. RECURRENT SYMPTOM: Have you had difficulty breathing before? If Yes, ask: When was the last time? and What happened that time?      yes 6. CARDIAC HISTORY: Do you have any history of heart disease? (e.g., heart attack, angina, bypass surgery, angioplasty)      yes 7. LUNG HISTORY: Do you have any history of lung disease?  (e.g., pulmonary embolus, asthma, emphysema)     yes 8. CAUSE: What do you think is causing the breathing problem?      unsure 9. OTHER SYMPTOMS: Do you have any other symptoms? (e.g.,  chest pain, cough, dizziness, fever, runny nose)     Chest pain comes and goes 10. O2 SATURATION MONITOR:  Do you use an oxygen  saturation monitor (pulse oximeter) at home? If Yes, ask: What is your reading (oxygen  level) today? What is your usual oxygen  saturation reading? (e.g., 95%)       94% 11. PREGNANCY: Is there any chance you are pregnant? When was your last menstrual period?       no 12. TRAVEL: Have you traveled out of the country in the last month? (e.g., travel history, exposures)       no  Protocols used: Breathing Difficulty-A-AH

## 2024-09-26 ENCOUNTER — Other Ambulatory Visit (HOSPITAL_COMMUNITY): Payer: Self-pay

## 2024-09-26 NOTE — Telephone Encounter (Signed)
 Spoke with patient in regards to this she has an appointment on 10/7 - sent to pharmacy team to review other options that might be cheaper

## 2024-09-26 NOTE — Telephone Encounter (Signed)
 Called and spoke with patient, she states she had already been contacted this morning and she has an appointment on 10/7 at 1:30 pm.  She was not happy that she was not seeing Dr. Annella.  I looked at the schedule and he had an appointment available at 1:30 pm the same day.  I scheduled her to see Dr. Annella instead of Dr. Kara.  She verbalized understanding.  She said she wants her inhaler, Symbicort  changed to [facility] because it is cheaper.  I let her know I would send a message to Dr. Annella so he would have the information when he sees her on Tuesday.  She verbalized understanding.  Nothing further needed.

## 2024-09-26 NOTE — Telephone Encounter (Signed)
 Patient states Symbicort  to expensive Per last ov note she was to contact office if cost was an issue. Patient would like to go back to [place]. She states no one showed her how to use the inhaler Gareth).

## 2024-09-26 NOTE — Telephone Encounter (Signed)
 Dr. Annella, Patient wants her inhaler changed from Symbicort  to [facility] because it is cheaper.  She has an office visit with you on Tuesday 10/7.  This is just so you will have the information when you see her.  Thank you.

## 2024-09-29 ENCOUNTER — Encounter (HOSPITAL_BASED_OUTPATIENT_CLINIC_OR_DEPARTMENT_OTHER): Attending: Internal Medicine | Admitting: Internal Medicine

## 2024-09-29 DIAGNOSIS — L97512 Non-pressure chronic ulcer of other part of right foot with fat layer exposed: Secondary | ICD-10-CM | POA: Insufficient documentation

## 2024-09-29 DIAGNOSIS — E1043 Type 1 diabetes mellitus with diabetic autonomic (poly)neuropathy: Secondary | ICD-10-CM | POA: Diagnosis not present

## 2024-09-29 DIAGNOSIS — I890 Lymphedema, not elsewhere classified: Secondary | ICD-10-CM | POA: Insufficient documentation

## 2024-09-29 DIAGNOSIS — E10621 Type 1 diabetes mellitus with foot ulcer: Secondary | ICD-10-CM | POA: Insufficient documentation

## 2024-09-30 ENCOUNTER — Ambulatory Visit: Admitting: Pulmonary Disease

## 2024-09-30 ENCOUNTER — Encounter: Payer: Self-pay | Admitting: Pulmonary Disease

## 2024-09-30 VITALS — BP 148/58 | HR 71 | Temp 98.0°F | Ht 67.0 in | Wt 258.0 lb

## 2024-09-30 DIAGNOSIS — J9611 Chronic respiratory failure with hypoxia: Secondary | ICD-10-CM

## 2024-09-30 DIAGNOSIS — Z9981 Dependence on supplemental oxygen: Secondary | ICD-10-CM

## 2024-09-30 DIAGNOSIS — G4733 Obstructive sleep apnea (adult) (pediatric): Secondary | ICD-10-CM | POA: Diagnosis not present

## 2024-09-30 MED ORDER — ALBUTEROL SULFATE HFA 108 (90 BASE) MCG/ACT IN AERS: 2.0000 | INHALATION_SPRAY | RESPIRATORY_TRACT | 11 refills | Status: AC | PRN

## 2024-09-30 MED ORDER — FLUTICASONE-SALMETEROL 500-50 MCG/ACT IN AEPB: 1.0000 | INHALATION_SPRAY | Freq: Two times a day (BID) | RESPIRATORY_TRACT | 12 refills | Status: AC

## 2024-09-30 NOTE — Patient Instructions (Signed)
 Nice to see  Use Wixela 1 puff in the morning 1 puff in the evening rinse mouth with water after every use  Stop using Symbicort   Use albuterol  every 4 hours as needed for shortness of breath  Return to clinic in 3 months or sooner as needed with Dr. Annella

## 2024-09-30 NOTE — Progress Notes (Signed)
 @Patient  ID: [name], female    DOB: Nov 19, 1951, 73 y.o.   MRN: [id number]  No chief complaint on file.   Referring provider: Leonel Cole, MD  HPI:   73 y.o. woman whom are seen in follow up for evaluation of pulmonary hypertension and hypoxemia.  Most recent discharge summary reviewed.    Returns for routine follow-up.  Cough ok. Wheelchair bound.  Stable oxygen .  Requesting more affordable inhaler.  Symbicort  $47.  Wixela $0.  Prescribed Wixela.  Refill albuterol  to use in between.  Discussed escalating to triple therapy if needing albuterol  so frequently in the future.  HPI at initial visit: Patient with longstanding cardiac history including intermittent volume overload.  Atrial fibrillation.  On anticoagulation.  Has longstanding dyspnea.  Shortness of breath.  However, she is been largely wheelchair-bound for a couple years.  Following strokes.  She is on oxygen  for 6 months.  This is on discharge from the hospital.  Hospital discharge summary 01/2022 reviewed at the time she was placed on oxygen .  At that time she was diuresed.  TTE estimated elevated PASP.  Breathing about the same.  She reports good adherence to oxygen .  She has not used BiPAP.  She has not used in some time.  Does not use regularly.  Despite rest and the importance of using this.  At last hospitalization she was asked to do this.  She is afraid for a few days and does not use it reliably and months.  Reviewed most recent chest x-ray 05/2019.  Shows enlarged cardiac silhouette, prominent interstitium suggestive of volume overload on my interpretation.  PMH: ESRD on HD, atrial fibrillation, diabetes, hypothyroidism, CVA Surgical history: Hysterectomy, ankle fracture surgery, Family history: Mother with CAD, hyperlipidemia, hypertension, renal disease, father with CVA Social history: Never smoker, lives in [place] / Pulmonary Flowsheets:   ACT:      No data to display           MMRC:     No data to display          Epworth:      No data to display          Tests:   FENO:  No results found for: NITRICOXIDE  PFT:     No data to display          WALK:      No data to display          Imaging: Personally reviewed and as per EMR discussion this note MR BRAIN WO CONTRAST Result Date: 09/05/2024 CLINICAL DATA:  Provided history: Neuro deficit, acute, stroke suspected. EXAM: MRI HEAD WITHOUT CONTRAST TECHNIQUE: Multiplanar, multiecho pulse sequences of the brain and surrounding structures were obtained without intravenous contrast. COMPARISON:  Head CT 09/05/2024. Brain MRI 08/05/2021. FINDINGS: Intermittently motion degraded examination. Most notably, the axial diffusion-weighted sequences are severely motion degraded. Within this limitation, findings are as follows. Brain: Generalized cerebral atrophy. Advanced chronic small vessel ischemic disease within the pons and bilateral middle cerebellar peduncles with chronic lacunar infarcts at these sites. A chronic lacunar infarct within the right middle cerebral peduncle is new as compared to the prior MRI of 08/05/2021. Multifocal T2 FLAIR hyperintense signal abnormality within the cerebral white matter, nonspecific but compatible with mild chronic small vessel ischemic disease. No acute infarct identified. No evidence of an intracranial mass. No extra-axial fluid collection. No midline shift. Vascular: Maintained flow voids within the proximal large arterial vessels. Skull  and upper cervical spine: No focal worrisome marrow lesion. Sinuses/Orbits: No mass or acute finding within the imaged orbits. Prior bilateral ocular lens replacement. No significant paranasal sinus disease. IMPRESSION: 1. Motion degraded examination as described. Within this limitation, findings are as follows. 2. No evidence of an acute intracranial abnormality. 3. Parenchymal atrophy, chronic small vessel ischemic disease and  chronic lacunar infarcts, as described. Of note, a chronic lacunar infarct within the right middle cerebellar peduncle which is new from the prior MRI of 08/05/2021. Electronically Signed   By: Rockey Childs D.O.   On: 09/05/2024 16:30   DG Chest Portable 1 View Result Date: 09/05/2024 CLINICAL DATA:  Shortness of breath. EXAM: PORTABLE CHEST 1 VIEW COMPARISON:  December 10, 2023. FINDINGS: Stable cardiomegaly with mild central pulmonary vascular congestion. Left-sided pacemaker is unchanged in position. Bony thorax is unremarkable. IMPRESSION: Stable cardiomegaly with mild central pulmonary vascular congestion. Electronically Signed   By: Lynwood Landy Raddle M.D.   On: 09/05/2024 10:09   CT HEAD WO CONTRAST Result Date: 09/05/2024 CLINICAL DATA:  73 year old female with right eye vision changes, right facial numbness. Symptoms discovered at 0300 hours. EXAM: CT HEAD WITHOUT CONTRAST TECHNIQUE: Contiguous axial images were obtained from the base of the skull through the vertex without intravenous contrast. RADIATION DOSE REDUCTION: This exam was performed according to the departmental dose-optimization program which includes automated exposure control, adjustment of the mA and/or kV according to patient size and/or use of iterative reconstruction technique. COMPARISON:  Brain MRI 08/05/2021.  Head CT 12/10/2023. FINDINGS: Brain: Stable cerebral volume from last year, normal for age. Chronic basal ganglia vascular calcifications. No midline shift, ventriculomegaly, mass effect, evidence of mass lesion, intracranial hemorrhage or evidence of cortically based acute infarction. Extensive chronic brainstem lacunar infarcts, heterogeneity appears stable. Otherwise gray-white differentiation stable and within normal limits for age. ASPECTS 10. Vascular: Extensive Calcified atherosclerosis at the skull base. No suspicious intracranial vascular hyperdensity. Skull: Congenital incomplete ossification of the posterior C1  ring, normal variant. Calvarium intact. No acute osseous abnormality identified. Sinuses/Orbits: Visualized paranasal sinuses and mastoids are stable and well aerated. Other: No gaze deviation. Chronic postoperative changes to the orbits appear stable. Visualized scalp soft tissues are within normal limits. IMPRESSION: No acute intracranial abnormality. Stable non contrast CT appearance of advanced chronic brainstem small vessel disease. Electronically Signed   By: VEAR Hurst M.D.   On: 09/05/2024 09:33    Lab Results: Personally reviewed CBC    Component Value Date/Time   WBC 5.3 09/07/2024 0634   RBC 3.56 (L) 09/07/2024 0634   HGB 10.4 (L) 09/07/2024 0634   HGB 11.0 (L) 03/02/2022 1548   HCT 34.0 (L) 09/07/2024 0634   HCT 33.8 (L) 03/02/2022 1548   PLT 166 09/07/2024 0634   PLT 185 03/02/2022 1548   MCV 95.5 09/07/2024 0634   MCV 88 03/02/2022 1548   MCH 29.2 09/07/2024 0634   MCHC 30.6 09/07/2024 0634   RDW 12.1 09/07/2024 0634   RDW 12.9 03/02/2022 1548   LYMPHSABS 1.5 09/07/2024 0634   LYMPHSABS 0.9 05/21/2019 0947   MONOABS 0.8 09/07/2024 0634   EOSABS 0.1 09/07/2024 0634   EOSABS 0.0 05/21/2019 0947   BASOSABS 0.0 09/07/2024 0634   BASOSABS 0.0 05/21/2019 0947    BMET    Component Value Date/Time   NA 141 09/07/2024 0634   NA 145 (H) 03/02/2022 1548   K 4.1 09/07/2024 0634   CL 104 09/07/2024 0634   CO2 28 09/07/2024 9365  GLUCOSE 201 (H) 09/07/2024 0634   BUN 23 09/07/2024 0634   BUN 13 03/02/2022 1548   CREATININE 0.98 09/07/2024 0634   CREATININE 1.03 (H) 08/24/2015 1652   CALCIUM  8.7 (L) 09/07/2024 0634   GFRNONAA >60 09/07/2024 0634   GFRAA >60 06/25/2020 1131    BNP    Component Value Date/Time   BNP 133.5 (H) 09/05/2024 0808    ProBNP No results found for: PROBNP  Specialty Problems       Pulmonary Problems   OSA (obstructive sleep apnea)   BiPAP at home Severe Range 05/27/2019: need f/u with sleep clinic in 10 weeks- not yet bringing  patients in for in-lab testing for CPAP titration studies, due to the virus pandemic> autoPAP       Acute on chronic respiratory failure with hypoxia (HCC)   Chronic respiratory failure with hypoxia, on home O2 therapy (HCC) - 2 L/min O2   Obesity hypoventilation syndrome (HCC)   Aspiration pneumonia (HCC)    Allergies  Allergen Reactions   Latex Rash    Immunization History  Administered Date(s) Administered   Fluad Quad(high Dose 65+) 11/16/2021   INFLUENZA, HIGH DOSE SEASONAL PF 10/12/2017   Influenza,inj,Quad PF,6+ Mos 08/20/2013, 09/30/2014, 09/22/2015, 09/24/2016, 12/09/2018, 09/24/2019   Influenza-Unspecified 09/24/2017   PFIZER(Purple Top)SARS-COV-2 Vaccination 06/28/2020   Pneumococcal Conjugate-13 11/24/2016   Pneumococcal Polysaccharide-23 01/25/2012, 06/07/2018   Tdap 01/25/2012   Zoster Recombinant(Shingrix) 04/11/2017    Past Medical History:  Diagnosis Date   Abnormal echocardiogram    Anemia    Arthritis    BPPV (benign paroxysmal positional vertigo), right 06/13/2017   Bradycardia 03/07/2020   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   CHB (complete heart block) (HCC) 03/08/2020   CHF (congestive heart failure) (HCC)    Closed fracture of left proximal humerus 07/08/2019   Decreased range of motion of left shoulder 09/03/2020   Depression    Dizzy 03/07/2020   bradycardia   Edentulous 09/03/2020   GERD (gastroesophageal reflux disease)    Hemiparesis affecting left side as late effect of cerebrovascular accident (CVA) (HCC) 03/08/2020   Hemorrhoids    History of Falls with injury 03/08/2020   Fall resulting in left humeral fracture   Hyperlipidemia    Hypertension    Hypothyroidism    Intracranial vascular stenosis    Lacunar infarction (HCC)    Brain MRI 07/2021 multiple small remote pontine lacunar infarcts   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  Severe chronic microvascular ischemic disease of the pons, left middle cerebellar peduncle, and mini remote lacunar infarcts, progressive since 2019.  Remote pontine microhemorrhage most likely of hypertensive etiology.  10/10/21 CT reveals chronic atrophic changes.     Malignant tumor of thyroid  gland (HCC) 03/27/2022   Near syncope 03/08/2020   Paroxysmal atrial fibrillation (HCC)    Presence of permanent cardiac pacemaker    Proteinuria 03/08/2020   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 06/25/20 06/26/2020   Seasonal allergies    Stroke (HCC)    Suicidal ideation 11/16/2021   Thyroid  cancer (HCC)    per pt S/p Total Thyroidectomy with Radioactive Iodine Therapy   Type 1 diabetes mellitus (HCC)     Tobacco History: Social History   Tobacco Use  Smoking Status Never  Smokeless Tobacco Never   Counseling given: Not Answered  Continue to not smoke  Outpatient Encounter Medications as of 09/30/2024  Medication Sig   acetaminophen  (TYLENOL ) 650 MG CR tablet Take 650 mg by mouth every 8 (eight) hours as needed for pain.   amLODipine  (NORVASC ) 2.5 MG tablet Take 2.5 mg by mouth daily.   apixaban  (ELIQUIS ) 5 MG TABS tablet Take 1 tablet (5 mg total) by mouth 2 (two) times daily.   atorvastatin  (LIPITOR ) 80 MG tablet TAKE 1 TABLET(80 MG) BY MOUTH EVERY EVENING   busPIRone  (BUSPAR ) 5 MG tablet TAKE 1 TABLET(5 MG) BY MOUTH THREE TIMES DAILY   fluticasone  (FLONASE ) 50 MCG/ACT nasal spray Place 1 spray into both nostrils daily as needed for allergies.   fluticasone -salmeterol (WIXELA INHUB) 500-50 MCG/ACT AEPB Inhale 1 puff into the lungs in the morning and at bedtime.   furosemide  (LASIX ) 40 MG tablet Take 1 tablet (40 mg total) by mouth 2 (two) times daily.   gabapentin  (NEURONTIN ) 300 MG capsule Take 1 capsule (300 mg total) by mouth 3 (three) times daily.   HUMALOG 100 UNIT/ML injection Inject 0-75 Units  into the skin See admin instructions. Load 200 units into Omnipod every 2 days.   levothyroxine  (SYNTHROID ) 150 MCG tablet Take 1 tablet (150 mcg total) by mouth daily before breakfast.   loratadine  (CLARITIN ) 10 MG tablet Take 10 mg by mouth daily.   losartan  (COZAAR ) 25 MG tablet TAKE 1 TABLET(25 MG) BY MOUTH DAILY   OXYGEN  Inhale 2 L into the lungs continuous.   pantoprazole  (PROTONIX ) 40 MG tablet Take 1 tablet (40 mg total) by mouth 2 (two) times daily.   polyethylene glycol powder (GLYCOLAX /MIRALAX ) 17 GM/SCOOP powder Take 17 g by mouth 2 (two) times daily as needed for moderate constipation. (Patient taking differently: Take 17 g by mouth daily.)   [DISCONTINUED] albuterol  (VENTOLIN  HFA) 108 (90 Base) MCG/ACT inhaler Inhale 2 puffs into the lungs every 6 (six) hours as needed for wheezing or shortness of breath.   [DISCONTINUED] budesonide -formoterol  (SYMBICORT ) 80-4.5 MCG/ACT inhaler Inhale 2 puffs into the lungs 2 (two) times daily.   albuterol  (VENTOLIN  HFA) 108 (90 Base) MCG/ACT inhaler Inhale 2 puffs into the lungs every 4 (four) hours as needed for wheezing or shortness of breath.   No facility-administered encounter medications on file as of 09/30/2024.     Review of Systems  Review of Systems  N/a Physical Exam  BP (!) 148/58   Pulse 71   Temp 98 F (36.7 C) (Oral)   Ht 5' 7 (1.702 m)   Wt 258 lb (117 kg)   SpO2 96%   BMI 40.41 kg/m   Wt Readings from Last 5 Encounters:  09/30/24 258 lb (117 kg)  12/10/23 255 lb 15.3 oz (116.1 kg)  11/20/23 256 lb (116.1 kg)  09/11/23 240 lb 1.3 oz (108.9 kg)  08/07/23 240 lb (108.9 kg)    BMI Readings from Last 5 Encounters:  09/30/24 40.41 kg/m  02/18/24 40.09 kg/m  12/10/23 48.36 kg/m  11/20/23 40.10 kg/m  09/11/23 37.60 kg/m     Physical Exam General: Sitting in chair, chronically ill-appearing EOMI, no icterus Neck: Supple, JVP difficult to assess given habitus Pulmonary: Clear, normal work of  breathing Cardiovascular: Warm, significant lower extremity edema noted Abdomen: Nondistended, bowel sounds present MSK: No synovitis, no joint effusion Neuro: Normal gait, global but symmetric weakness in lower extremities, upper extremities okay Psych: Normal mood, full affect   Assessment & Plan:   Presumed pulmonary hypertension: Based on echocardiogram findings with elevated right-sided  pressures.  Likely multifactorial due to group 2 disease given the signs of left-sided volume overload on the chest imaging in the past, group 3 disease in setting of hypoxemia and OSA nonadherent to NIPPV.  Unfortunate she is a very poor candidate for vasodilators given the multifactorial nature of her disease.  She is nonadherent to BiPAP therapy which was explained to her is a foundational treatment of group 3 disease related to OSA.  Currently only using nocturnal oxygen .  Furthermore, she is essentially wheelchair-bound due to prior strokes and weakness.  Do not think she would benefit much in terms of dyspnea with pulmonary vasodilators and given the risk she is a poor candidate.    Chronic hypoxemic respiratory failure: Likely multifactorial related related to OHS, VQ mismatch with presumed pulmonary hypertension, and intermittent volume overload.  Continue 2 L at all times.  Shortness of breath: Chronic issue.  Largely unchanged.  No significant improvement with Symbicort .,  Continue albuterol  as needed.  Will trial Wixela, I think escalating to triple inhaled therapy but needed, I worry about her ability to manipulate DPI inhaler.  OSA: Has not tolerated NIPPV after multiple attempts.  Only using nocturnal oxygen .   Return in about 3 months (around 12/31/2024) for f/u Dr. Annella.   Donnice JONELLE Annella, MD 09/30/2024

## 2024-10-02 ENCOUNTER — Ambulatory Visit (INDEPENDENT_AMBULATORY_CARE_PROVIDER_SITE_OTHER): Payer: Medicare PPO

## 2024-10-02 DIAGNOSIS — I442 Atrioventricular block, complete: Secondary | ICD-10-CM

## 2024-10-03 LAB — CUP PACEART REMOTE DEVICE CHECK
Battery Remaining Longevity: 92 mo
Battery Voltage: 2.99 V
Brady Statistic AP VP Percent: 33.15 %
Brady Statistic AP VS Percent: 1.06 %
Brady Statistic AS VP Percent: 63.93 %
Brady Statistic AS VS Percent: 1.86 %
Brady Statistic RA Percent Paced: 34.29 %
Brady Statistic RV Percent Paced: 97.07 %
Date Time Interrogation Session: 20251009034241
Implantable Lead Connection Status: 753985
Implantable Lead Connection Status: 753985
Implantable Lead Implant Date: 20210702
Implantable Lead Implant Date: 20210702
Implantable Lead Location: 753859
Implantable Lead Location: 753860
Implantable Lead Model: 5076
Implantable Lead Model: 5076
Implantable Pulse Generator Implant Date: 20210702
Lead Channel Impedance Value: 266 Ohm
Lead Channel Impedance Value: 361 Ohm
Lead Channel Impedance Value: 361 Ohm
Lead Channel Impedance Value: 437 Ohm
Lead Channel Pacing Threshold Amplitude: 0.625 V
Lead Channel Pacing Threshold Amplitude: 0.625 V
Lead Channel Pacing Threshold Pulse Width: 0.4 ms
Lead Channel Pacing Threshold Pulse Width: 0.4 ms
Lead Channel Sensing Intrinsic Amplitude: 4.125 mV
Lead Channel Sensing Intrinsic Amplitude: 4.125 mV
Lead Channel Sensing Intrinsic Amplitude: 5.75 mV
Lead Channel Sensing Intrinsic Amplitude: 5.75 mV
Lead Channel Setting Pacing Amplitude: 1.5 V
Lead Channel Setting Pacing Amplitude: 2.5 V
Lead Channel Setting Pacing Pulse Width: 0.4 ms
Lead Channel Setting Sensing Sensitivity: 1.2 mV
Zone Setting Status: 755011
Zone Setting Status: 755011

## 2024-10-03 NOTE — Progress Notes (Signed)
 Remote PPM Transmission

## 2024-10-05 ENCOUNTER — Ambulatory Visit: Payer: Self-pay | Admitting: Cardiology

## 2024-10-07 NOTE — Progress Notes (Signed)
 Remote PPM Transmission

## 2024-10-13 ENCOUNTER — Encounter (HOSPITAL_BASED_OUTPATIENT_CLINIC_OR_DEPARTMENT_OTHER): Admitting: Internal Medicine

## 2024-10-13 DIAGNOSIS — L97512 Non-pressure chronic ulcer of other part of right foot with fat layer exposed: Secondary | ICD-10-CM

## 2024-10-13 DIAGNOSIS — E1042 Type 1 diabetes mellitus with diabetic polyneuropathy: Secondary | ICD-10-CM | POA: Diagnosis not present

## 2024-10-13 DIAGNOSIS — E10621 Type 1 diabetes mellitus with foot ulcer: Secondary | ICD-10-CM

## 2024-10-16 ENCOUNTER — Ambulatory Visit: Admitting: Podiatry

## 2024-10-16 ENCOUNTER — Encounter: Payer: Self-pay | Admitting: Podiatry

## 2024-10-16 DIAGNOSIS — M79674 Pain in right toe(s): Secondary | ICD-10-CM | POA: Diagnosis not present

## 2024-10-16 DIAGNOSIS — D689 Coagulation defect, unspecified: Secondary | ICD-10-CM | POA: Diagnosis not present

## 2024-10-16 DIAGNOSIS — M79675 Pain in left toe(s): Secondary | ICD-10-CM

## 2024-10-16 DIAGNOSIS — B351 Tinea unguium: Secondary | ICD-10-CM | POA: Diagnosis not present

## 2024-10-16 DIAGNOSIS — E1159 Type 2 diabetes mellitus with other circulatory complications: Secondary | ICD-10-CM | POA: Diagnosis not present

## 2024-10-16 NOTE — Progress Notes (Signed)
 This patient returns to my office for at risk foot care.  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  This patient is unable to cut nails herself  since the patient cannot reach her  nails.These nails are painful walking and wearing shoes.  Patient presents in a wheelchair.  She is under treatment for ulcer back of right heel. This patient presents for at risk foot care today.  General Appearance  Alert, conversant and in no acute stress.  Vascular  Dorsalis pedis and posterior tibial  pulses are absent due to swelling. bilaterally.  Capillary return is within normal limits  bilaterally. Temperature is within normal limits  bilaterally.  Neurologic  Senn-Weinstein monofilament wire diminished  bilaterally. Muscle power within normal limits bilaterally.  Nails Thick disfigured discolored nails with subungual debris  from hallux to fifth toes bilaterally. No evidence of bacterial infection or drainage bilaterally.  Orthopedic  No limitations of motion  feet .  No crepitus or effusions noted.  No bony pathology or digital deformities noted.  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  Hallux  malleus  B/L  Skin  normotropic skin with no porokeratosis noted bilaterally.  Skin lesion on dorsum of hallux malleus  B/L.  Bandage on back of right heel treated by the wound center.  Onychomycosis  Pain in right toes  Pain in left toes  Consent was obtained for treatment procedures.   Mechanical debridement of nails 1-5  bilaterally performed with a nail nipper.  Filed with dremel without incident.    Return office visit    3 months       Told patient to return for periodic foot care and evaluation due to potential at risk complications.   Cordella Bold DPM

## 2024-10-27 ENCOUNTER — Telehealth: Payer: Self-pay

## 2024-10-27 ENCOUNTER — Encounter (HOSPITAL_BASED_OUTPATIENT_CLINIC_OR_DEPARTMENT_OTHER): Attending: Internal Medicine | Admitting: Internal Medicine

## 2024-10-27 DIAGNOSIS — E10621 Type 1 diabetes mellitus with foot ulcer: Secondary | ICD-10-CM | POA: Diagnosis not present

## 2024-10-27 DIAGNOSIS — L97512 Non-pressure chronic ulcer of other part of right foot with fat layer exposed: Secondary | ICD-10-CM | POA: Diagnosis not present

## 2024-10-27 DIAGNOSIS — E1042 Type 1 diabetes mellitus with diabetic polyneuropathy: Secondary | ICD-10-CM | POA: Insufficient documentation

## 2024-10-27 DIAGNOSIS — I890 Lymphedema, not elsewhere classified: Secondary | ICD-10-CM | POA: Insufficient documentation

## 2024-10-29 ENCOUNTER — Telehealth: Payer: Self-pay

## 2024-10-29 DIAGNOSIS — Z789 Other specified health status: Secondary | ICD-10-CM

## 2024-11-14 ENCOUNTER — Telehealth: Payer: Self-pay

## 2024-11-14 NOTE — Progress Notes (Signed)
 Complex Care Management Note  Care Guide Note 11/14/2024 Name: [name] MRN: [id number] DOB: 1951/05/09  [name] is a 73 y.o. year old female who sees Leonel Cole, MD for primary care. I reached out to [name] by phone today to offer complex care management services.  [name] was given information about Complex Care Management services today including:   The Complex Care Management services include support from the care team which includes your Nurse Care Manager, Clinical Social Worker, or Pharmacist.  The Complex Care Management team is here to help remove barriers to the health concerns and goals most important to you. Complex Care Management services are voluntary, and the patient may decline or stop services at any time by request to their care team member.   Complex Care Management Consent Status: Patient agreed to services and verbal consent obtained.   Follow up plan:  Telephone appointment with complex care management team member scheduled for:  11/21/24 at 9:30 a.m.   Encounter Outcome:  Patient Scheduled  Dreama Lynwood [facility]  [facility], [facility] VBCI Assistant Direct Dial: [phone]  Fax: [phone]

## 2024-11-21 ENCOUNTER — Other Ambulatory Visit: Payer: Self-pay | Admitting: Licensed Clinical Social Worker

## 2024-11-21 NOTE — Patient Outreach (Addendum)
 Social Drivers of Health  Community Resource and Care Coordination Visit Note   11/21/2024  Name: [name] MRN: [id number] DOB:11-22-1951  Situation: Referral received for [facility] needs assessment and assistance related to Transportation 3 in 1 biometric toilet. I obtained verbal consent from Patient.  Visit completed with Patient on the phone.   Background:   SDOH Interventions Today    Flowsheet Row Most Recent Value  SDOH Interventions   Food Insecurity Interventions Intervention Not Indicated  Housing Interventions Intervention Not Indicated  Transportation Interventions SCAT ([employer])  [will try to call SCAT back]  Utilities Interventions Intervention Not Indicated     Assessment:   Goals Addressed             This Visit's Progress    BSW VBCI Social Work Care Plan       Current SDOH Barriers:  Transportation Housing barriers  Interventions: Patient interviewed and appropriate screenings performed Referred patient to community resources  Provided patient with information about Aging Gracefully and CHS Advised patient to call insurance and SCAT to ride assistance, she has already been approved for [employer] with primary care provider re: a 3 in 1 biometric Toilet*          Recommendation:   attend all scheduled provider appointments call for transportation assistance at least one week before appointments Call SCAT and follow up with the PCP for a new prescription for a 3 in 1 toilet, SW emailed resources for [facility] and CHS   Follow Up Plan:   Telephone follow up appointment date/time:  12/05/2024 at 9:30 am  Tobias CHARM Maranda HEDWIG, PhD [facility], [facility] Social Worker Direct Dial: [phone]  Fax: [phone]

## 2024-11-21 NOTE — Patient Instructions (Signed)
 Visit Information  Thank you for taking time to visit with me today. Please don't hesitate to contact me if I can be of assistance to you before our next scheduled appointment.  Our next appointment is by telephone on 12/05/2024 at 10:30 am Please call the care guide team at [phone] if you need to cancel or reschedule your appointment.   Following is a copy of your care plan:   Goals Addressed             This Visit's Progress    BSW VBCI Social Work Care Plan       Current SDOH Barriers:  Transportation Housing barriers  Interventions: Patient interviewed and appropriate screenings performed Referred patient to community resources  Provided patient with information about Aging Gracefully and CHS Advised patient to call insurance and SCAT to ride assistance, she has already been approved for [employer] with primary care provider re: a 3 in 1 biometric Toilet*          Please call the Suicide and Crisis Lifeline: 988 go to [facility] [address] [place] [phone]) call 911 if you are experiencing a Mental Health or Behavioral Health Crisis or need someone to talk to.  Patient verbalized understanding of Care plan and visit instructions communicated this visit  Tobias CHARM Maranda HEDWIG, PhD [facility], [facility] Social Worker Direct Dial: [phone]  Fax: [phone]

## 2024-11-26 ENCOUNTER — Encounter (HOSPITAL_BASED_OUTPATIENT_CLINIC_OR_DEPARTMENT_OTHER): Admitting: Internal Medicine

## 2024-11-27 NOTE — Telephone Encounter (Signed)
 NFN

## 2024-12-05 ENCOUNTER — Encounter: Payer: Self-pay | Admitting: Licensed Clinical Social Worker

## 2024-12-05 ENCOUNTER — Other Ambulatory Visit: Payer: Self-pay | Admitting: Licensed Clinical Social Worker

## 2024-12-05 NOTE — Patient Instructions (Signed)
 [name] Nicolette Jesus Husband - I am sorry I was unable to reach you today for our scheduled appointment. I work with Leonel Cole, MD and am calling to support your healthcare needs. Please contact me at [phone] at your earliest convenience. I look forward to speaking with you soon.   Thank you,  Tobias CHARM Maranda HEDWIG, PhD [facility], [facility] Social Worker Direct Dial: [phone]  Fax: [phone]

## 2024-12-19 ENCOUNTER — Telehealth: Payer: Self-pay

## 2024-12-19 NOTE — Progress Notes (Signed)
 Complex Care Management Note Care Guide Note  12/19/2024 Name: [name] MRN: [id number] DOB: May 30, 1951   Complex Care Management Outreach Attempts: An unsuccessful telephone outreach was attempted today to offer the patient information about available complex care management services.  Follow Up Plan:  Additional outreach attempts will be made to offer the patient complex care management information and services.   Encounter Outcome:  No Answer  Dreama Lynwood [facility]  [facility], [facility] VBCI Assistant Direct Dial: [phone]  Fax: [phone]

## 2024-12-26 ENCOUNTER — Other Ambulatory Visit (HOSPITAL_COMMUNITY): Payer: Self-pay

## 2024-12-26 ENCOUNTER — Telehealth: Payer: Self-pay

## 2024-12-26 NOTE — Telephone Encounter (Signed)
 Copied from CRM [id number]. Topic: Clinical - Prescription Issue >> Dec 26, 2024 11:09 AM Joesph PARAS wrote: Reason for CRM: Patient is calling to state that her [employer] plan won't cover fluticasone -salmeterol (WIXELA INHUB) 500-50 MCG/ACT AEPB or  albuterol  (VENTOLIN  HFA) 108 (90 Base) MCG/ACT inhaler  And she needs a new prescription that they will cover. New plan information updated, converted from [facility] to [facility] GOLD. System does not reflect change, returns a 'plan matched' inquiry.   Please reach out to patient to confirm prescriptions sent.   Routing to prior auth

## 2024-12-29 NOTE — Progress Notes (Signed)
 Complex Care Management Care Guide Note  12/29/2024 Name: [name] MRN: [id number] DOB: 03/12/1951  [name] [name] is a 74 y.o. year old female who is a primary care patient of Leonel Cole, MD and is actively engaged with the care management team. I reached out to [name] Jesus Lanell by phone today to assist with re-scheduling  with the BSW.  Follow up plan: Telephone appointment with complex care management team member scheduled for:  12/31/24 at 10:30 a.m.   Dreama [facility]wood [facility]  [name], [facility] VBCI Assistant Direct Dial: [phone]  Fax: [phone]

## 2024-12-29 NOTE — Telephone Encounter (Signed)
 Pt is aware. NFN

## 2024-12-31 ENCOUNTER — Telehealth: Payer: Self-pay | Admitting: Licensed Clinical Social Worker

## 2024-12-31 ENCOUNTER — Other Ambulatory Visit: Payer: Self-pay

## 2024-12-31 NOTE — Patient Instructions (Signed)
 [name] Husband - I am sorry I was unable to reach you today for our scheduled appointment. I work with Leonel Cole, MD and am calling to support your healthcare needs. Please contact me at [phone] at your earliest convenience. I look forward to speaking with you soon.   Thank you,  Orlean Fey, BSW [place]  [facility] Social Worker, [employer] [phone]

## 2025-01-01 ENCOUNTER — Ambulatory Visit

## 2025-01-01 DIAGNOSIS — I480 Paroxysmal atrial fibrillation: Secondary | ICD-10-CM | POA: Diagnosis not present

## 2025-01-02 ENCOUNTER — Ambulatory Visit: Payer: Self-pay | Admitting: Cardiology

## 2025-01-02 LAB — CUP PACEART REMOTE DEVICE CHECK
Battery Remaining Longevity: 89 mo
Battery Voltage: 2.99 V
Brady Statistic AP VP Percent: 26.97 %
Brady Statistic AP VS Percent: 1.46 %
Brady Statistic AS VP Percent: 67.7 %
Brady Statistic AS VS Percent: 3.87 %
Brady Statistic RA Percent Paced: 28.49 %
Brady Statistic RV Percent Paced: 94.65 %
Date Time Interrogation Session: 20260108060722
Implantable Lead Connection Status: 753985
Implantable Lead Connection Status: 753985
Implantable Lead Implant Date: 20210702
Implantable Lead Implant Date: 20210702
Implantable Lead Location: 753859
Implantable Lead Location: 753860
Implantable Lead Model: 5076
Implantable Lead Model: 5076
Implantable Pulse Generator Implant Date: 20210702
Lead Channel Impedance Value: 266 Ohm
Lead Channel Impedance Value: 361 Ohm
Lead Channel Impedance Value: 380 Ohm
Lead Channel Impedance Value: 437 Ohm
Lead Channel Pacing Threshold Amplitude: 0.625 V
Lead Channel Pacing Threshold Amplitude: 0.625 V
Lead Channel Pacing Threshold Pulse Width: 0.4 ms
Lead Channel Pacing Threshold Pulse Width: 0.4 ms
Lead Channel Sensing Intrinsic Amplitude: 4.625 mV
Lead Channel Sensing Intrinsic Amplitude: 4.625 mV
Lead Channel Sensing Intrinsic Amplitude: 5.375 mV
Lead Channel Sensing Intrinsic Amplitude: 5.375 mV
Lead Channel Setting Pacing Amplitude: 1.5 V
Lead Channel Setting Pacing Amplitude: 2.5 V
Lead Channel Setting Pacing Pulse Width: 0.4 ms
Lead Channel Setting Sensing Sensitivity: 1.2 mV
Zone Setting Status: 755011
Zone Setting Status: 755011

## 2025-01-06 ENCOUNTER — Other Ambulatory Visit: Payer: Self-pay | Admitting: Licensed Clinical Social Worker

## 2025-01-06 NOTE — Progress Notes (Signed)
 Remote PPM Transmission

## 2025-01-06 NOTE — Patient Instructions (Signed)

## 2025-01-06 NOTE — Patient Outreach (Signed)
 Social Drivers of Health  Community Resource and Care Coordination Visit Note   01/06/2025  Name: [name] MRN: [id number] DOB:[birth year]  Situation: Referral received for [facility] needs assessment and assistance related to Transportation. I obtained verbal consent from Patient.  Visit completed with Patient on the phone.   Background:   SDOH Interventions Today    Flowsheet Row Most Recent Value  SDOH Interventions   Food Insecurity Interventions Intervention Not Indicated  Housing Interventions Intervention Not Indicated  Transportation Interventions Intervention Not Indicated  Utilities Interventions Intervention Not Indicated     Assessment:   Goals Addressed             This Visit's Progress    COMPLETED: BSW VBCI Social Work Care Plan       Current SDOH Barriers:  Transportation Housing barriers  Interventions: Patient interviewed and appropriate screenings performed Referred patient to community resources  Provided patient with information about Aging [facility] and [facility] Advised patient to call insurance and [employer] to ride assistance, she has already been approved for [employer] with primary care provider re: a 3 in 1 biometric Toilet*          Recommendation:   attend all scheduled provider appointments call for transportation assistance at least one week before appointments Patient stated that she is using [employer] again and is very happy that she is be treated good and that the people are very nice to her an no more problems like previous rides and the patient is not scared to ride. The SW has made contact with [employer] previously t explain the concerns about the transportation services. Patient stated that she is no longer going to be going to Dr. Leonel and will be going to [facility], where she can get everything under one roof. SW explained that this will be the last call since all the needs were met. Patient wanted the SWS  (sw  supervisor) to call her so that she could explain that the SW made her transportation situation much better and that she felt safe. Again.   Follow Up Plan:   Patient has achieved all patient stated goals. [employer] will be closed. Patient has been provided contact information should new needs arise.   [name] CHARM Maranda HEDWIG, PhD [facility], [facility] Social Worker Direct Dial: [phone]  Fax: [phone]

## 2025-01-15 ENCOUNTER — Encounter: Payer: Self-pay | Admitting: Podiatry

## 2025-01-15 ENCOUNTER — Ambulatory Visit: Admitting: Podiatry

## 2025-01-15 DIAGNOSIS — M79674 Pain in right toe(s): Secondary | ICD-10-CM

## 2025-01-15 DIAGNOSIS — D689 Coagulation defect, unspecified: Secondary | ICD-10-CM | POA: Diagnosis not present

## 2025-01-15 DIAGNOSIS — E1159 Type 2 diabetes mellitus with other circulatory complications: Secondary | ICD-10-CM | POA: Diagnosis not present

## 2025-01-15 DIAGNOSIS — M79675 Pain in left toe(s): Secondary | ICD-10-CM

## 2025-01-15 DIAGNOSIS — B351 Tinea unguium: Secondary | ICD-10-CM

## 2025-01-15 NOTE — Progress Notes (Signed)
 This patient returns to my office for at risk foot care.  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  This patient is unable to cut nails herself  since the patient cannot reach her  nails.These nails are painful walking and wearing shoes.  Patient presents in a wheelchair.  She is under treatment for ulcer back of right heel. This patient presents for at risk foot care today.  General Appearance  Alert, conversant and in no acute stress.  Vascular  Dorsalis pedis and posterior tibial  pulses are absent due to swelling. bilaterally.  Capillary return is within normal limits  bilaterally. Temperature is within normal limits  bilaterally.  Neurologic  Senn-Weinstein monofilament wire diminished  bilaterally. Muscle power within normal limits bilaterally.  Nails Thick disfigured discolored nails with subungual debris  from hallux to fifth toes bilaterally. No evidence of bacterial infection or drainage bilaterally.  Orthopedic  No limitations of motion  feet .  No crepitus or effusions noted.  No bony pathology or digital deformities noted.  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  Hallux  malleus  B/L  Skin  normotropic skin with no porokeratosis noted bilaterally.  Skin lesion on dorsum of hallux malleus  B/L.  Bandage on back of right heel treated by the wound center.  Onychomycosis  Pain in right toes  Pain in left toes  Consent was obtained for treatment procedures.   Mechanical debridement of nails 1-5  bilaterally performed with a nail nipper.  Filed with dremel without incident.    Return office visit    3 months       Told patient to return for periodic foot care and evaluation due to potential at risk complications.   Cordella Bold DPM

## 2025-04-02 ENCOUNTER — Encounter

## 2025-04-15 ENCOUNTER — Ambulatory Visit: Admitting: Podiatry

## 2025-07-02 ENCOUNTER — Encounter

## 2025-10-01 ENCOUNTER — Encounter
